# Patient Record
Sex: Female | Born: 1975 | Race: Black or African American | Hispanic: No | Marital: Married | Smoking: Never smoker
Health system: Southern US, Community
[De-identification: ages and names within clinical notes are randomized; demographics above are authoritative.]

## PROBLEM LIST (undated history)

## (undated) DIAGNOSIS — I639 Cerebral infarction, unspecified: Secondary | ICD-10-CM

## (undated) DIAGNOSIS — R601 Generalized edema: Secondary | ICD-10-CM

## (undated) DIAGNOSIS — J45909 Unspecified asthma, uncomplicated: Secondary | ICD-10-CM

## (undated) DIAGNOSIS — I509 Heart failure, unspecified: Secondary | ICD-10-CM

## (undated) DIAGNOSIS — E119 Type 2 diabetes mellitus without complications: Secondary | ICD-10-CM

## (undated) DIAGNOSIS — Z515 Encounter for palliative care: Secondary | ICD-10-CM

## (undated) DIAGNOSIS — K219 Gastro-esophageal reflux disease without esophagitis: Secondary | ICD-10-CM

## (undated) DIAGNOSIS — I10 Essential (primary) hypertension: Secondary | ICD-10-CM

---
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
## 2010-11-13 ENCOUNTER — Inpatient Hospital Stay (INDEPENDENT_AMBULATORY_CARE_PROVIDER_SITE_OTHER)
Admission: RE | Admit: 2010-11-13 | Discharge: 2010-11-13 | Disposition: A | Discharge status: Home / Self Care | Payer: Self-pay | Source: Ambulatory Visit | Attending: Family Medicine | Admitting: Family Medicine

## 2010-11-13 DIAGNOSIS — J069 Acute upper respiratory infection, unspecified: Secondary | ICD-10-CM

## 2010-11-13 DIAGNOSIS — I10 Essential (primary) hypertension: Secondary | ICD-10-CM

## 2011-09-26 ENCOUNTER — Emergency Department (HOSPITAL_COMMUNITY): Payer: Medicaid Other

## 2011-09-26 ENCOUNTER — Emergency Department (HOSPITAL_COMMUNITY)
Admission: EM | Admit: 2011-09-26 | Discharge: 2011-09-26 | Disposition: A | Discharge status: Home / Self Care | Payer: Medicaid Other | Attending: Emergency Medicine | Admitting: Emergency Medicine

## 2011-09-26 DIAGNOSIS — M129 Arthropathy, unspecified: Secondary | ICD-10-CM | POA: Insufficient documentation

## 2011-09-26 DIAGNOSIS — M545 Low back pain, unspecified: Secondary | ICD-10-CM | POA: Insufficient documentation

## 2011-09-26 DIAGNOSIS — I10 Essential (primary) hypertension: Secondary | ICD-10-CM

## 2011-09-26 DIAGNOSIS — M1990 Unspecified osteoarthritis, unspecified site: Secondary | ICD-10-CM

## 2011-09-26 MED ORDER — TRIAMTERENE-HCTZ 75-50 MG PO TABS: 1.0000 | ORAL_TABLET | Freq: Every day | ORAL | Status: DC

## 2011-09-26 MED ORDER — CYCLOBENZAPRINE HCL 10 MG PO TABS: 10.0000 mg | ORAL_TABLET | Freq: Two times a day (BID) | ORAL | Status: AC | PRN

## 2011-09-26 MED ORDER — INSULIN GLARGINE 100 UNIT/ML ~~LOC~~ SOLN: 50.0000 [IU] | Freq: Every day | SUBCUTANEOUS | Status: DC

## 2011-09-26 MED ORDER — OXYCODONE-ACETAMINOPHEN 5-325 MG PO TABS: 1.0000 | ORAL_TABLET | Freq: Four times a day (QID) | ORAL | Status: AC | PRN

## 2011-09-26 MED ORDER — OXYCODONE-ACETAMINOPHEN 5-325 MG PO TABS
1.0000 | ORAL_TABLET | Freq: Once | ORAL | Status: AC
  Administered 2011-09-26: 1 via ORAL
  Filled 2011-09-26: qty 1

## 2011-09-26 NOTE — ED Notes (Addendum)
Pt reports pain with range of motion, going from sitting to standing position and lying flat. Denies urinary incontinence, numbness or tingling with the pain.

## 2011-09-26 NOTE — ED Notes (Signed)
Pt ambulatory in department with steady gait

## 2011-09-26 NOTE — ED Notes (Signed)
Pt c/o midlower back pain, nonradiating, denies numbness/tingling. States tried ibuprofen at home with no relief. Denies recent injury, urinary symptoms.

## 2011-09-26 NOTE — ED Provider Notes (Addendum)
History     CSN: [id number]  Arrival date & time 09/26/11  0808   First MD Initiated Contact with Patient 09/26/11 [phone]      Chief Complaint  Patient presents with  . Back Pain    (Consider location/radiation/quality/duration/timing/severity/associated sxs/prior treatment) Patient is a 36 y.o. female presenting with back pain. The history is provided by the patient.  Back Pain  This is a new problem. Episode onset: 4 days ago. The problem occurs constantly. The problem has been gradually worsening. The pain is associated with no known injury (fall 2 years ago but no ongoing back pain). The pain is present in the lumbar spine. The quality of the pain is described as aching. The pain does not radiate. The pain is at a severity of 8/10. The symptoms are aggravated by bending, twisting and certain positions (worse with sitting). The pain is the same all the time. Pertinent negatives include no fever, no abdominal pain, no bowel incontinence, no bladder incontinence, no dysuria, no pelvic pain, no leg pain, no paresis and no weakness. She has tried NSAIDs for the symptoms. The treatment provided no relief. Risk factors include obesity.    No past medical history on file.  No past surgical history on file.  No family history on file.  History  Substance Use Topics  . Smoking status: Not on file  . Smokeless tobacco: Not on file  . Alcohol Use: Not on file    OB History    No data available      Review of Systems  Constitutional: Negative for fever.  Gastrointestinal: Negative for abdominal pain and bowel incontinence.  Genitourinary: Negative for bladder incontinence, dysuria and pelvic pain.  Musculoskeletal: Positive for back pain.  Neurological: Negative for weakness.  All other systems reviewed and are negative.    Allergies  Review of patient's allergies indicates not on file.  Home Medications  No current outpatient prescriptions on file.  Pulse 99  Temp 98.7 F  (37.1 C) (Oral)  Resp 16  SpO2 99%  Physical Exam  Nursing note and vitals reviewed. Constitutional: She is oriented to person, place, and time. She appears well-developed and well-nourished. No distress.  HENT:  Head: Normocephalic and atraumatic.  Mouth/Throat: Oropharynx is clear and moist.  Eyes: EOM are normal. Pupils are equal, round, and reactive to light.  Neck: Normal range of motion. Neck supple. No spinous process tenderness and no muscular tenderness present. No rigidity. Normal range of motion present.  Cardiovascular: Normal rate, regular rhythm, normal heart sounds and intact distal pulses.   No murmur heard. Pulmonary/Chest: Effort normal and breath sounds normal. She has no wheezes. She has no rales.  Abdominal: Soft. She exhibits no distension. There is no tenderness. There is no CVA tenderness.  Musculoskeletal: She exhibits no tenderness.       Lumbar back: She exhibits tenderness and pain. She exhibits normal range of motion, no swelling, no deformity and normal pulse.  Neurological: She is alert and oriented to person, place, and time. She has normal strength. No sensory deficit. Coordination normal.  Reflex Scores:      Patellar reflexes are 2+ on the right side and 2+ on the left side. Skin: Skin is warm and dry. No rash noted.  Psychiatric: She has a normal mood and affect.    ED Course  Procedures (including critical care time)  Labs Reviewed - No data to display Dg Lumbar Spine Complete  09/26/2011  *RADIOLOGY REPORT*  Clinical Data: Chronic  low back pain  LUMBAR SPINE - COMPLETE 4+ VIEW  Comparison: None.  Findings: The lumbar vertebrae are in normal alignment.  There is mild degenerative disc disease at L5-S1 with slight loss of disc space and spurring.  The remainder of intervertebral disc spaces appear normal.  No compression deformity is seen.  On oblique views the facet joints are unremarkable.  The SI joints are corticated. The bowel gas pattern is  unremarkable.  IMPRESSION:   Normal alignment with mild degenerative disc disease at L5-S1.  Original Report Authenticated By: Juline Patch, M.D.     1. Lumbar back pain   2. Arthritis   3. HTN (hypertension)       MDM   Pt with gradual onset of back pain suggestive of lumbar in origin.  No neurovascular compromise and no incontinence.  Pt has no infectious sx, hx of CA  or other red flags concerning for pathologic back pain.  Pt is able to ambulate and worse with sitting.  Normal strength and reflexes on exam.  Denies trauma.  Pt is morbidly obese which could also be a cause for her pain.   Lumbar films with mild degenerative dx in L5-S1 area which is where pt is having pain.  Pt also HTN today however ran out of maxide 3 days ago.  Denies any sx of htn (CP, HA, visual problems, etc).  Pt's ppx refilled and given resources for PCP. Will give pt pain control and to return for developement of above sx.         Gwyneth Sprout, MD 09/26/11 [id number]  Gwyneth Sprout, MD 09/26/11 [phone]

## 2011-11-22 ENCOUNTER — Encounter (HOSPITAL_COMMUNITY): Payer: Self-pay | Admitting: *Deleted

## 2011-11-22 ENCOUNTER — Emergency Department (HOSPITAL_COMMUNITY)
Admission: EM | Admit: 2011-11-22 | Discharge: 2011-11-22 | Disposition: A | Discharge status: Home / Self Care | Payer: Medicaid Other | Attending: Emergency Medicine | Admitting: Emergency Medicine

## 2011-11-22 DIAGNOSIS — M79604 Pain in right leg: Secondary | ICD-10-CM

## 2011-11-22 DIAGNOSIS — Z794 Long term (current) use of insulin: Secondary | ICD-10-CM | POA: Insufficient documentation

## 2011-11-22 DIAGNOSIS — I10 Essential (primary) hypertension: Secondary | ICD-10-CM | POA: Insufficient documentation

## 2011-11-22 DIAGNOSIS — E119 Type 2 diabetes mellitus without complications: Secondary | ICD-10-CM | POA: Insufficient documentation

## 2011-11-22 DIAGNOSIS — M79609 Pain in unspecified limb: Secondary | ICD-10-CM | POA: Insufficient documentation

## 2011-11-22 DIAGNOSIS — R739 Hyperglycemia, unspecified: Secondary | ICD-10-CM

## 2011-11-22 HISTORY — DX: Essential (primary) hypertension: I10

## 2011-11-22 HISTORY — DX: Morbid (severe) obesity due to excess calories: E66.01

## 2011-11-22 LAB — CBC WITH DIFFERENTIAL/PLATELET
Basophils Absolute: 0 10*3/uL (ref 0.0–0.1)
Basophils Relative: 0 % (ref 0–1)
Eosinophils Absolute: 0.2 10*3/uL (ref 0.0–0.7)
HCT: 42.2 % (ref 36.0–46.0)
Hemoglobin: 14 g/dL (ref 12.0–15.0)
MCH: 27.1 pg (ref 26.0–34.0)
MCHC: 33.2 g/dL (ref 30.0–36.0)
Monocytes Absolute: 0.5 10*3/uL (ref 0.1–1.0)
Monocytes Relative: 8 % (ref 3–12)
Neutro Abs: 2.6 10*3/uL (ref 1.7–7.7)
Neutrophils Relative %: 38 % — ABNORMAL LOW (ref 43–77)
RDW: 13.5 % (ref 11.5–15.5)

## 2011-11-22 LAB — COMPREHENSIVE METABOLIC PANEL
AST: 10 U/L (ref 0–37)
Albumin: 3.5 g/dL (ref 3.5–5.2)
BUN: 7 mg/dL (ref 6–23)
Creatinine, Ser: 0.65 mg/dL (ref 0.50–1.10)
Total Protein: 7.6 g/dL (ref 6.0–8.3)

## 2011-11-22 MED ORDER — IBUPROFEN 800 MG PO TABS
800.0000 mg | ORAL_TABLET | Freq: Once | ORAL | Status: AC
  Administered 2011-11-22: 800 mg via ORAL
  Filled 2011-11-22: qty 1

## 2011-11-22 MED ORDER — IBUPROFEN 800 MG PO TABS: 800.0000 mg | ORAL_TABLET | Freq: Three times a day (TID) | ORAL | Status: DC

## 2011-11-22 MED ORDER — TRIAMTERENE-HCTZ 75-50 MG PO TABS: 1.0000 | ORAL_TABLET | Freq: Every day | ORAL | Status: DC

## 2011-11-22 NOTE — ED Provider Notes (Signed)
History     CSN: [id number]  Arrival date & time 11/22/11  1017   First MD Initiated Contact with Patient 11/22/11 1038      Chief Complaint  Patient presents with  . Leg Pain    (Consider location/radiation/quality/duration/timing/severity/associated sxs/prior treatment) HPI  A morbidly obese 36 year old female with history of diabetes and hypertension presents complaining of right leg pain. For the past 2 weeks patient has had pain to her right calf.  She described her pain as a throbbing and cramping sensation, radiating up to her R thigh.  Pain worsen when she sits or walk, and improves with rest.  She occasional noticed tingling sensation to R foot, that is intermittent.  She denies fever, chills, cp, sob, hemoptysis, or rash.  Denies leg swelling.  Did recalled accidentally slipped and fell 4 weeks ago but did not have any significant pain at that time.  Pt has tried OTC NSAIDs without relief.    Pt acknowledge her BP has always remains high, above 200 systolic.  She takes Maxzide for it but has ran out for the past 4 days.  Denies headache, vision changes, weakness or numbness.  Does not have a PCP.  Past Medical History  Diagnosis Date  . Morbid obesity   . Hypertension   . Diabetes mellitus without complication     History reviewed. No pertinent past surgical history.  No family history on file.  History  Substance Use Topics  . Smoking status: Not on file  . Smokeless tobacco: Not on file  . Alcohol Use: No    OB History    Grav Para Term Preterm Abortions TAB SAB Ect Mult Living                  Review of Systems  All other systems reviewed and are negative.    Allergies  Review of patient's allergies indicates no known allergies.  Home Medications   Current Outpatient Rx  Name Route Sig Dispense Refill  . IBUPROFEN 200 MG PO TABS Oral Take 400 mg by mouth every 6 (six) hours as needed. As needed for pain.    . INSULIN GLARGINE 100 UNIT/ML [place] SOLN  Subcutaneous Inject 50 Units into the skin at bedtime.    . INSULIN GLARGINE 100 UNIT/ML [place] SOLN Subcutaneous Inject 50 Units into the skin at bedtime. 10 mL 12  . TRIAMTERENE-HCTZ 75-50 MG PO TABS Oral Take 1 tablet by mouth daily. 30 tablet 6    BP 252/153  Pulse 110  Temp 98.3 F (36.8 C) (Oral)  Resp 22  SpO2 95%  LMP 10/20/2011  Physical Exam  Nursing note and vitals reviewed. Constitutional: She is oriented to person, place, and time. She appears well-developed and well-nourished. No distress.       Awake, alert, nontoxic appearance. Morbidly obese  HENT:  Head: Atraumatic.  Mouth/Throat: Oropharynx is clear and moist.  Eyes: Conjunctivae normal are normal. Right eye exhibits no discharge. Left eye exhibits no discharge.  Neck: Neck supple.  Cardiovascular: Normal rate and regular rhythm.  Exam reveals no gallop and no friction rub.   No murmur heard. Pulmonary/Chest: Effort normal. No respiratory distress. She has no wheezes. She exhibits no tenderness.  Abdominal: Soft. There is no tenderness. There is no rebound.  Musculoskeletal: She exhibits no edema and no tenderness.       ROM appears intact, no obvious focal weakness  R leg: tenderness to R calf without swelling or overlying skin changes.  No  baker's cyst noted.  R knee with FROM, nontender  BLE: no palpable cords, erythema, pedal edema.  Pedal pulses 2+.  Neurological: She is alert and oriented to person, place, and time.       Mental status and motor strength appears intact  Normal gait  Skin: Skin is warm. No rash noted.  Psychiatric: She has a normal mood and affect.    ED Course  Procedures (including critical care time)  Results for orders placed during the hospital encounter of 11/22/11  CBC WITH DIFFERENTIAL      Component Value Range   WBC 6.7  4.0 - 10.5 K/uL   RBC 5.16 (*) 3.87 - 5.11 MIL/uL   Hemoglobin 14.0  12.0 - 15.0 g/dL   HCT 64.4  03.4 - 74.2 %   MCV 81.8  78.0 - 100.0 fL   MCH 27.1   26.0 - 34.0 pg   MCHC 33.2  30.0 - 36.0 g/dL   RDW 59.5  63.8 - 75.6 %   Platelets PLATELETS APPEAR ADEQUATE  150 - 400 K/uL   Neutrophils Relative 38 (*) 43 - 77 %   Neutro Abs 2.6  1.7 - 7.7 K/uL   Lymphocytes Relative 51 (*) 12 - 46 %   Lymphs Abs 3.4  0.7 - 4.0 K/uL   Monocytes Relative 8  3 - 12 %   Monocytes Absolute 0.5  0.1 - 1.0 K/uL   Eosinophils Relative 3  0 - 5 %   Eosinophils Absolute 0.2  0.0 - 0.7 K/uL   Basophils Relative 0  0 - 1 %   Basophils Absolute 0.0  0.0 - 0.1 K/uL  COMPREHENSIVE METABOLIC PANEL      Component Value Range   Sodium 135  135 - 145 mEq/L   Potassium 3.7  3.5 - 5.1 mEq/L   Chloride 99  96 - 112 mEq/L   CO2 28  19 - 32 mEq/L   Glucose, Bld 300 (*) 70 - 99 mg/dL   BUN 7  6 - 23 mg/dL   Creatinine, Ser 4.33  0.50 - 1.10 mg/dL   Calcium 9.5  8.4 - 29.5 mg/dL   Total Protein 7.6  6.0 - 8.3 g/dL   Albumin 3.5  3.5 - 5.2 g/dL   AST 10  0 - 37 U/L   ALT 11  0 - 35 U/L   Alkaline Phosphatase 50  39 - 117 U/L   Total Bilirubin 0.2 (*) 0.3 - 1.2 mg/dL   GFR calc non Af Amer >90  >90 mL/min   GFR calc Af Amer >90  >90 mL/min   No results found.  1. R calf pain 2. hypertension   MDM  Pt with R calf pain, reproducible on palpation.  No risk factors for PE or DVT. Pain likely musculoskeletal.  Does not meet Well's criteria for PE.  Pt does have elevated BP with 250s systolic.   Will check labs to r/o endorgan damage.  Ibuprofen for pain.  Will recheck BP.    Will prescribe Maxzide at discharge, give referral resources.  Discussed care with my attending who agrees with plan.     1:03 PM No evidence of end organ damage based on labs.  Pt has CBG 300, however did not take her morning meds.  Repeat BP is 170s systolic.  Therefore, will refill her Maxzide, will provide pain meds, and will have pt f/u with her PCP for further care.  Pt voice understanding and agrees with plan.  BP 184/118  Pulse 89  Temp 98 F (36.7 C) (Oral)  Resp 18  SpO2  96%  LMP 10/20/2011  Nursing notes reviewed and considered in documentation  Previous records reviewed and considered  All labs/vitals reviewed and considered     Fayrene Helper, PA-C 11/22/11 1307

## 2011-11-22 NOTE — ED Notes (Signed)
Pt slipped and fell one month ago and has had pain in her right leg since.  Pain is most severe in her right calf.  No swelling per pt, no increasing pain when lifting foot.  Pt is ambulatory, pain increased today

## 2011-11-23 NOTE — ED Provider Notes (Signed)
Medical screening examination/treatment/procedure(s) were performed by non-physician practitioner and as supervising physician I was immediately available for consultation/collaboration.  Toy Baker, MD 11/23/11 [phone]

## 2011-11-26 ENCOUNTER — Emergency Department (HOSPITAL_COMMUNITY)
Admission: EM | Admit: 2011-11-26 | Discharge: 2011-11-26 | Disposition: A | Discharge status: Home / Self Care | Payer: Medicaid Other | Source: Home / Self Care | Attending: Family Medicine | Admitting: Family Medicine

## 2011-11-26 ENCOUNTER — Encounter (HOSPITAL_COMMUNITY): Payer: Self-pay | Admitting: Emergency Medicine

## 2011-11-26 DIAGNOSIS — IMO0002 Reserved for concepts with insufficient information to code with codable children: Secondary | ICD-10-CM

## 2011-11-26 DIAGNOSIS — S86919A Strain of unspecified muscle(s) and tendon(s) at lower leg level, unspecified leg, initial encounter: Secondary | ICD-10-CM

## 2011-11-26 DIAGNOSIS — I10 Essential (primary) hypertension: Secondary | ICD-10-CM

## 2011-11-26 MED ORDER — TRAMADOL HCL 50 MG PO TABS: 50.0000 mg | ORAL_TABLET | Freq: Four times a day (QID) | ORAL | Status: DC | PRN

## 2011-11-26 MED ORDER — TRIAMTERENE-HCTZ 75-50 MG PO TABS: 1.0000 | ORAL_TABLET | Freq: Every day | ORAL | Status: DC

## 2011-11-26 NOTE — ED Notes (Signed)
Pt c/o right leg pain at calf x1 week... Says she fell and slipped on water around 10/20/11... Did not feel the pain at first but now is experiencing pain at right calf and radiating towards right thigh... Unable to apply pressure and unable to to daily activities... Denies: fevers, vomiting, nausea, diarrhea... Pt is alert w/no signs of distress.

## 2011-11-26 NOTE — ED Provider Notes (Signed)
History     CSN: [id number]  Arrival date & time 11/26/11  4098   First MD Initiated Contact with Patient 11/26/11 [phone]      Chief Complaint  Patient presents with  . Leg Pain    (Consider location/radiation/quality/duration/timing/severity/associated sxs/prior treatment) Patient is a 36 y.o. female presenting with leg pain. The history is provided by the patient.  Leg Pain  The incident occurred more than 1 week ago (3 weeks ago  slipped at home on water, pain since, seen in ER 10/11, eval, , pt concerned that x-ray not done, still hurts to walk.). The incident occurred at home. The injury mechanism was a fall. The pain is present in the right leg. The pain is moderate. Pertinent negatives include no numbness, no loss of motion, no muscle weakness, no loss of sensation and no tingling. The symptoms are aggravated by bearing weight. She has tried NSAIDs for the symptoms.    Past Medical History  Diagnosis Date  . Morbid obesity   . Hypertension   . Diabetes mellitus without complication     History reviewed. No pertinent past surgical history.  No family history on file.  History  Substance Use Topics  . Smoking status: Not on file  . Smokeless tobacco: Not on file  . Alcohol Use: No    OB History    Grav Para Term Preterm Abortions TAB SAB Ect Mult Living                  Review of Systems  Constitutional: Negative.   Musculoskeletal: Positive for gait problem. Negative for back pain and joint swelling.  Neurological: Negative for tingling and numbness.    Allergies  Review of patient's allergies indicates no known allergies.  Home Medications   Current Outpatient Rx  Name Route Sig Dispense Refill  . IBUPROFEN 800 MG PO TABS Oral Take 1 tablet (800 mg total) by mouth 3 (three) times daily. 21 tablet 0  . INSULIN GLARGINE 100 UNIT/ML [place] SOLN Subcutaneous Inject 50 Units into the skin at bedtime. 10 mL 12  . TRIAMTERENE-HCTZ 75-50 MG PO TABS Oral Take 1  tablet by mouth daily. 30 tablet 3  . IBUPROFEN 200 MG PO TABS Oral Take 400 mg by mouth every 6 (six) hours as needed. As needed for pain.    Marland Kitchen TRAMADOL HCL 50 MG PO TABS Oral Take 1 tablet (50 mg total) by mouth every 6 (six) hours as needed for pain. 15 tablet 0  . TRIAMTERENE-HCTZ 75-50 MG PO TABS Oral Take 1 tablet by mouth daily. 30 tablet 1    BP 248/147  Pulse 88  Temp 98.4 F (36.9 C) (Oral)  Resp 16  SpO2 100%  LMP 10/20/2011  Physical Exam  Nursing note and vitals reviewed. Constitutional: She is oriented to person, place, and time. She appears well-developed and well-nourished.  Musculoskeletal: She exhibits tenderness. She exhibits no edema.       Right lower leg: She exhibits no bony tenderness, no swelling and no edema.       Legs: Neurological: She is alert and oriented to person, place, and time.  Skin: Skin is warm and dry.    ED Course  Procedures (including critical care time)  Labs Reviewed - No data to display No results found.   1. Muscle strain, lower leg   2. Hypertension, benign       MDM  I agree completely with eval and assessment from ER visit 10/11.  Linna Hoff, MD 11/26/11 [phone]

## 2012-02-12 NOTE — L&D Delivery Note (Signed)
Delivery Note At 6:42 PM a viable female was delivered via Vaginal, Spontaneous Delivery (Presentation: Left Occiput Anterior).  APGAR: 9, 9; weight .   Placenta status: Intact, Spontaneous.  Cord: 3 vessels with the following complications: None. There was a tight nuchal cord that I reduced after delivery. She wishes to have a PPS.   Anesthesia: Epidural  Episiotomy: None Lacerations: None Suture Repair: n/a Est. Blood Loss (mL): 300  Mom to postpartum.  Baby to nursery-stable.  Kendricks Reap C. 10/11/2012, 7:08 PM

## 2012-08-11 ENCOUNTER — Encounter: Payer: Self-pay | Admitting: *Deleted

## 2012-08-26 ENCOUNTER — Other Ambulatory Visit (HOSPITAL_COMMUNITY)
Admission: RE | Admit: 2012-08-26 | Discharge: 2012-08-26 | Disposition: A | Discharge status: Home / Self Care | Payer: Medicaid Other | Source: Ambulatory Visit | Attending: Obstetrics and Gynecology | Admitting: Obstetrics and Gynecology

## 2012-08-26 ENCOUNTER — Ambulatory Visit (INDEPENDENT_AMBULATORY_CARE_PROVIDER_SITE_OTHER): Payer: Medicaid Other | Admitting: Obstetrics and Gynecology

## 2012-08-26 ENCOUNTER — Encounter: Payer: Self-pay | Admitting: Obstetrics and Gynecology

## 2012-08-26 VITALS — BP 114/62

## 2012-08-26 DIAGNOSIS — O24919 Unspecified diabetes mellitus in pregnancy, unspecified trimester: Secondary | ICD-10-CM

## 2012-08-26 DIAGNOSIS — O09293 Supervision of pregnancy with other poor reproductive or obstetric history, third trimester: Secondary | ICD-10-CM | POA: Insufficient documentation

## 2012-08-26 DIAGNOSIS — O0930 Supervision of pregnancy with insufficient antenatal care, unspecified trimester: Secondary | ICD-10-CM

## 2012-08-26 DIAGNOSIS — Z01419 Encounter for gynecological examination (general) (routine) without abnormal findings: Secondary | ICD-10-CM | POA: Insufficient documentation

## 2012-08-26 DIAGNOSIS — E119 Type 2 diabetes mellitus without complications: Secondary | ICD-10-CM

## 2012-08-26 DIAGNOSIS — O163 Unspecified maternal hypertension, third trimester: Secondary | ICD-10-CM

## 2012-08-26 DIAGNOSIS — Z1151 Encounter for screening for human papillomavirus (HPV): Secondary | ICD-10-CM | POA: Insufficient documentation

## 2012-08-26 DIAGNOSIS — O0933 Supervision of pregnancy with insufficient antenatal care, third trimester: Secondary | ICD-10-CM

## 2012-08-26 DIAGNOSIS — O09529 Supervision of elderly multigravida, unspecified trimester: Secondary | ICD-10-CM

## 2012-08-26 DIAGNOSIS — O169 Unspecified maternal hypertension, unspecified trimester: Secondary | ICD-10-CM | POA: Insufficient documentation

## 2012-08-26 DIAGNOSIS — O09522 Supervision of elderly multigravida, second trimester: Secondary | ICD-10-CM

## 2012-08-26 LAB — POCT URINALYSIS DIP (DEVICE)
Bilirubin Urine: NEGATIVE
Glucose, UA: NEGATIVE mg/dL
Hgb urine dipstick: NEGATIVE
Ketones, ur: NEGATIVE mg/dL
Specific Gravity, Urine: 1.015 (ref 1.005–1.030)
pH: 7 (ref 5.0–8.0)

## 2012-08-26 LAB — HEMOGLOBIN A1C: Mean Plasma Glucose: 91 mg/dL (ref <117)

## 2012-08-26 LAB — GLUCOSE, CAPILLARY: Glucose-Capillary: 66 mg/dL — ABNORMAL LOW (ref 70–99)

## 2012-08-26 MED ORDER — BLOOD GLUCOSE METER KIT: PACK | Status: DC

## 2012-08-26 MED ORDER — GLUCOSE BLOOD VI STRP: ORAL_STRIP | Status: DC

## 2012-08-26 MED ORDER — CONCEPT OB 130-92.4-1 MG PO CAPS: 1.0000 | ORAL_CAPSULE | ORAL | Status: DC

## 2012-08-26 MED ORDER — ACCU-CHEK FASTCLIX LANCETS MISC: 1.0000 | Freq: Four times a day (QID) | Status: DC

## 2012-08-26 NOTE — Addendum Note (Signed)
Addended by: Jill Side on: 08/26/2012 05:06 PM   Modules accepted: Orders

## 2012-08-26 NOTE — Progress Notes (Signed)
   Subjective:    [name] is a [id number], with unknown LMP estimated to be around 01/12/12, being seen today for her first obstetrical visit.  No prior prenatal care, no [place] for dating. On PNV from [facility] department. Her obstetrical history is significant for obesity. Patient does not intend to breast feed. Pregnancy history fully reviewed.  Patient reports bilateral round ligament pain/pressure.  no bleeding, no contractions and no leaking.   There were no vitals filed for this visit.  HISTORY: OB History   Grav Para Term Preterm Abortions TAB SAB Ect Mult Living   3 2 2  0 0 0 0 0 0 2     # Outc Date GA Lbr Len/2nd Wgt Sex Del Anes PTL Lv   1 TRM  105w0d  6lb9oz(2.977kg) F SVD EPI     2 TRM  [redacted]w[redacted]d  6lb7oz(2.92kg) M SVD EPI  Yes   3 CUR              Past Medical History  Diagnosis Date  . Morbid obesity   . Hypertension   . Diabetes mellitus without complication    History reviewed. No pertinent past surgical history. Family History  Problem Relation Age of Onset  . Diabetes Mother      Exam    Uterus:     Pelvic Exam:    Perineum: Hemorrhoids   Vulva: normal   Vagina:  normal mucosa, normal discharge   pH:    Cervix: no bleeding following Pap, no cervical motion tenderness and no lesions   Adnexa: not evaluated      System: Breast:  normal appearance, no masses or tenderness   Skin: normal coloration and turgor, no rashes    Neurologic: oriented, no focal deficits   Extremities: normal strength, tone, and muscle mass   HEENT PERRLA   Mouth/Teeth mucous membranes moist, pharynx normal without lesions   Neck Supple, no masses, non-tender, no thyromegaly   Cardiovascular: regular rate and rhythm, no murmurs or gallops   Respiratory:  chest clear, no wheezing, crepitations, rhonchi, normal symmetric air entry   Abdomen: soft, non-tender; bowel sounds normal; no masses,  no organomegaly   Urinary: urethral meatus normal      Assessment:    Pregnancy:  [id number] Patient Active Problem List   Diagnosis Date Noted  . AMA (advanced maternal age) multigravida 35+ 08/26/2012  . Diabetes in pregnancy 08/26/2012  . Insufficient prenatal care 08/26/2012        Plan:     Initial labs drawn. Prenatal vitamins. Problem list reviewed and updated. Too late for Genetic Screening discussed  No prior Ultrasound. Scheduled for [place] and NST. Stop Maxide for HTN (BP today 114/62), take regular dose of Lantus, may need to titrate at next visit. Stop all Ibuprofen Continue PNV   Follow up in 1 weeks - apt on Monday 08/31/12 50% of 30 min visit spent on counseling and coordination of care.     Saralyn Pilar 08/26/2012

## 2012-08-26 NOTE — Progress Notes (Signed)
Evaluation and management procedures were performed by Resident physician under my supervision/collaboration. Chart reviewed, patient examined by me and I agree with management and plan. [place]oved here from [place], no PMD. No menses since SVD 10 mo ago. Pt refused to collect 24 hr urine. D/W Dr Debroah Loop re plan to d/c Maxzide and insulin (pt not taking insulin as advised), keep log book, follow CHO restriction diet, return Monday.

## 2012-08-26 NOTE — Progress Notes (Signed)
P = 66  Fasting CBG = 66.    OB US scheduled 7/17 @ 0730

## 2012-08-26 NOTE — Patient Instructions (Signed)
Type 1 or Type 2 Diabetes Mellitus During Pregnancy   Diabetes mellitus, often simply referred to as diabetes, is a long-term (chronic) disease. Type 1 diabetes occurs when the islet cells in the pancreas that make insulin (a hormone) are destroyed and can no longer make insulin. Type 2 diabetes occurs when the pancreas does not make enough insulin, the cells are less responsive to the insulin that is made (insulin resistance), or both. Insulin is needed to move sugars from food into the tissue cells. The tissue cells use the sugars for energy. The lack of insulin or the lack of normal response to insulin causes excess sugars to build up in the blood instead of going into the tissue cells. As a result, high blood sugar (hyperglycemia) develops.   If blood glucose levels are kept in the normal range both before and during pregnancy, women can have a healthy pregnancy. If your blood glucose levels are not well controlled, there may be risks to you, your unborn baby (fetus), your labor and delivery, or your newborn baby.   RISK FACTORS   · You are predisposed to developing type 1 diabetes if someone in your family has diabetes and you are exposed to certain environmental triggers.  · You have an increased chance of developing type 2 diabetes if you have a family history of diabetes and also have one or more of the following risk factors:  · Being overweight.  · Having an inactive lifestyle.  · Having a history of consistently eating high-calorie foods.   SYMPTOMS  The symptoms of diabetes include:  · Increased thirst (polydipsia).  · Increased urination (polyuria).  · Increased urination during the night (nocturia).  · Weight loss. This weight loss may be rapid.  · Frequent, recurring infections.  · Tiredness (fatigue).  · Weakness.  · Vision changes, such as blurred vision.  · Fruity smell to your breath.  · Abdominal pain.  · Nausea or vomiting.  DIAGNOSIS   Diabetes is diagnosed when blood glucose levels are  increased. Your blood glucose level may be checked by one or more of the following blood tests:  · A fasting blood glucose test.  You will not be allowed to eat for at least 8 hours before a blood sample is taken.  · A random blood glucose test. Your blood glucose is checked at any time of the day regardless of when you ate.  · A hemoglobin A1c blood glucose test. A hemoglobin A1c test provides information about blood glucose control over the previous 3 months.  · An oral glucose tolerance test (OGTT). Your blood glucose is measured after you have not eaten (fasted) for 1 3 hours and then after you drink a glucose-containing beverage. An OGTT is usually performed during weeks 24 28 of your pregnancy.  TREATMENT   · You will need to take diabetes medicine or insulin daily to keep blood glucose levels in the desired range.  · You will need to match insulin dosing with exercise and healthy food choices.   The treatment goal is to maintain the before-meal (preprandial), bedtime, and overnight blood glucose level at 60 99 mg/dL during pregnancy. The treatment goal is to further maintain the peak after-meal blood sugar (postprandial glucose) level at 100 140 mg/dL.    HOME CARE INSTRUCTIONS   · Have your hemoglobin A1c level checked twice a year.  · Perform daily blood glucose monitoring as directed by your caregiver. It is common to perform frequent blood glucose monitoring.    · Monitor urine ketones when   you are ill and as directed by your caregiver.  · Take your diabetes medicine and insulin as directed by your caregiver to maintain your blood glucose level in the desired range.  · Never run out of diabetes medicine or insulin. It is needed every day.  · Adjust insulin based on your intake of carbohydrates. Carbohydrates can raise blood glucose levels but need to be included in your diet. Carbohydrates provide vitamins, minerals, and fiber, which are an essential part of a healthy diet. Carbohydrates are found in  fruits, vegetables, whole grains, dairy products, legumes, and foods containing added sugars.  · Eat healthy foods. Alternate 3 meals with 3 snacks.  · Maintain a healthy weight gain. The usual total expected weight gain varies according to your prepregnancy body mass index (BMI).   · Carry a medical alert card or wear medical alert jewelry.  · Carry a 15 gram carbohydrate snack with you at all times to treat low blood sugar (hypoglycemia). Some examples of 15 gram carbohydrate snacks include:  · Glucose tablets, 3 or 4.  · Glucose gel, 15 gram tube.  · Raisins, 2 tablespoons (24 grams).  · Jelly beans, 6.  · Animal crackers, 8.  · Fruit juice, regular soda, or low fat milk, 4 ounces (120 mL).  · Gummy treats, 9.  · Recognize hypoglycemia. Hypoglycemia during pregnancy occurs with blood glucose levels of 60 mg/dL and below. The risk for hypoglycemia increases when fasting or skipping meals, during or after intense exercise, and during sleep. Hypoglycemia symptoms can include:  · Tremors or shakes.  · Decreased ability to concentrate.  · Sweating.  · Increased heart rate.  · Headache.  · Dry mouth.  · Hunger.  · Irritability.  · Anxiety.  · Restless sleep.  · Altered speech or coordination.  · Confusion.  · Treat hypoglycemia promptly. If you are alert and able to safely swallow, follow the 15:15 rule:  · Take 15 20 grams of rapid-acting glucose or carbohydrate. Rapid-acting options include glucose gel, glucose tablets, or 4 ounces (120 mL) of fruit juice, regular soda, or low-fat milk.  · Check your blood glucose level 15 minutes after taking the glucose.  · Take 15 20 grams more of glucose if the repeat blood glucose level is still 70 mg/dL or below.  · Eat a meal or snack within 1 hour once blood glucose levels return to normal.  · Engage in at least 30 minutes of physical activity a day or as directed by your caregiver. Ten minutes of physical activity timed 30 minutes after each meal is encouraged to control  postprandial blood glucose levels.    · Be alert to polyuria and polydipsia, which are early signs of hyperglycemia. An early awareness of hyperglycemia allows for prompt treatment. Treat hyperglycemia as directed by your caregiver.  · Adjust your insulin dosing and food intake as needed if you start a new exercise or sport.  · Follow your sick day plan at any time you are unable to eat or drink as usual.  · Avoid tobacco and alcohol use.  · Follow up with your caregiver regularly.  · Follow the advice of your caregiver regarding your prenatal and post-delivery (postpartum) appointments, meal planning, exercise, medicines, vitamins, blood tests, other medical tests, and physical activities.  · Continue daily skin and foot care. Examine your skin and feet daily for cuts, bruises, redness, nail problems, bleeding, blisters, or sores. A foot exam by a caregiver should be done annually.  · Brush   your teeth and gums at least twice a day and floss at least once a day. Follow up with your dentist regularly.  · Schedule an eye exam during the first trimester of your pregnancy or as directed by your caregiver.  · Share your diabetes management plan with your workplace or school.  · Stay up-to-date with immunizations.  · Learn to manage stress.  · Obtain ongoing diabetes education and support as needed.  SEEK MEDICAL CARE IF:   · You are unable to eat food or drink fluids for more than 6 hours.  · You have nausea and vomiting for more than 6 hours.  · You have a blood glucose level of 200 mg/dL and you have ketones in your urine.  · There is a change in mental status.  · You develop vision problems.  · You have a persistent headache.  · You have upper abdominal pain or discomfort.  · You develop an additional serious illness.  · You have diarrhea for more than 6 hours.  · You have been sick or have had a fever for a couple of days and are not getting better.  SEEK IMMEDIATE MEDICAL CARE IF:  · You have difficulty  breathing.  · You no longer feel the baby moving.  · You are bleeding or have discharge from your vagina.  · You start having premature contractions or labor.  MAKE SURE YOU:  · Understand these instructions.  · Will watch your condition.  · Will get help right away if you are not doing well or get worse.  Document Released: 10/23/2011 Document Revised: 01/15/2012 Document Reviewed: 10/23/2011  [employer]® Patient Information ©2014 [employer].

## 2012-08-27 ENCOUNTER — Ambulatory Visit (HOSPITAL_COMMUNITY)
Admission: RE | Admit: 2012-08-27 | Discharge: 2012-08-27 | Disposition: A | Discharge status: Home / Self Care | Payer: Medicaid Other | Source: Ambulatory Visit | Attending: Obstetrics and Gynecology | Admitting: Obstetrics and Gynecology

## 2012-08-27 DIAGNOSIS — O0933 Supervision of pregnancy with insufficient antenatal care, third trimester: Secondary | ICD-10-CM

## 2012-08-27 DIAGNOSIS — E119 Type 2 diabetes mellitus without complications: Secondary | ICD-10-CM

## 2012-08-27 DIAGNOSIS — O24913 Unspecified diabetes mellitus in pregnancy, third trimester: Secondary | ICD-10-CM

## 2012-08-27 DIAGNOSIS — O09529 Supervision of elderly multigravida, unspecified trimester: Secondary | ICD-10-CM | POA: Insufficient documentation

## 2012-08-27 DIAGNOSIS — O09522 Supervision of elderly multigravida, second trimester: Secondary | ICD-10-CM

## 2012-08-27 DIAGNOSIS — O24919 Unspecified diabetes mellitus in pregnancy, unspecified trimester: Secondary | ICD-10-CM | POA: Insufficient documentation

## 2012-08-27 DIAGNOSIS — Z3689 Encounter for other specified antenatal screening: Secondary | ICD-10-CM | POA: Insufficient documentation

## 2012-08-27 DIAGNOSIS — O163 Unspecified maternal hypertension, third trimester: Secondary | ICD-10-CM

## 2012-08-27 DIAGNOSIS — O10019 Pre-existing essential hypertension complicating pregnancy, unspecified trimester: Secondary | ICD-10-CM | POA: Insufficient documentation

## 2012-08-27 DIAGNOSIS — O0930 Supervision of pregnancy with insufficient antenatal care, unspecified trimester: Secondary | ICD-10-CM | POA: Insufficient documentation

## 2012-08-27 LAB — SICKLE CELL SCREEN: Sickle Cell Screen: NEGATIVE

## 2012-08-27 LAB — OBSTETRIC PANEL
Eosinophils Absolute: 0.1 10*3/uL (ref 0.0–0.7)
Hepatitis B Surface Ag: NEGATIVE
Lymphocytes Relative: 18 % (ref 12–46)
Lymphs Abs: 1.4 10*3/uL (ref 0.7–4.0)
MCH: 30.4 pg (ref 26.0–34.0)
Neutro Abs: 5.8 10*3/uL (ref 1.7–7.7)
Neutrophils Relative %: 74 % (ref 43–77)
Platelets: 168 10*3/uL (ref 150–400)
RBC: 4.21 MIL/uL (ref 3.87–5.11)
WBC: 7.8 10*3/uL (ref 4.0–10.5)

## 2012-08-27 LAB — TSH: TSH: 2.041 u[IU]/mL (ref 0.350–4.500)

## 2012-08-28 LAB — CULTURE, OB URINE: Organism ID, Bacteria: NO GROWTH

## 2012-08-28 LAB — DRUG SCREEN, URINE
Amphetamine Screen, Ur: NEGATIVE
Barbiturate Quant, Ur: NEGATIVE
Cocaine Metabolites: NEGATIVE

## 2012-08-31 ENCOUNTER — Other Ambulatory Visit: Payer: Medicaid Other

## 2012-09-01 ENCOUNTER — Ambulatory Visit (INDEPENDENT_AMBULATORY_CARE_PROVIDER_SITE_OTHER): Payer: Medicaid Other | Admitting: *Deleted

## 2012-09-01 VITALS — BP 107/59 | Wt 169.9 lb

## 2012-09-01 DIAGNOSIS — O169 Unspecified maternal hypertension, unspecified trimester: Secondary | ICD-10-CM

## 2012-09-01 DIAGNOSIS — O163 Unspecified maternal hypertension, third trimester: Secondary | ICD-10-CM

## 2012-09-01 NOTE — Progress Notes (Signed)
Reviewed NST and growth ultrasound from yesterday. AGA baby. Feeling well no complaints. Reviewed PTL danger signs. Did not bring log book of CBG today. Will bring on Monday.

## 2012-09-01 NOTE — Progress Notes (Signed)
P-[id number] 

## 2012-09-02 NOTE — Progress Notes (Signed)
Fungal organisms on Pap. Consider treatment at next visit.

## 2012-09-04 ENCOUNTER — Encounter: Payer: Self-pay | Admitting: *Deleted

## 2012-09-07 ENCOUNTER — Encounter: Payer: Self-pay | Admitting: Obstetrics & Gynecology

## 2012-09-07 ENCOUNTER — Ambulatory Visit (INDEPENDENT_AMBULATORY_CARE_PROVIDER_SITE_OTHER): Payer: Medicaid Other | Admitting: Obstetrics & Gynecology

## 2012-09-07 VITALS — BP 123/65 | Wt 172.0 lb

## 2012-09-07 DIAGNOSIS — O10013 Pre-existing essential hypertension complicating pregnancy, third trimester: Secondary | ICD-10-CM

## 2012-09-07 DIAGNOSIS — O24913 Unspecified diabetes mellitus in pregnancy, third trimester: Secondary | ICD-10-CM

## 2012-09-07 DIAGNOSIS — O24919 Unspecified diabetes mellitus in pregnancy, unspecified trimester: Secondary | ICD-10-CM

## 2012-09-07 DIAGNOSIS — O10019 Pre-existing essential hypertension complicating pregnancy, unspecified trimester: Secondary | ICD-10-CM

## 2012-09-07 LAB — POCT URINALYSIS DIP (DEVICE)
Ketones, ur: NEGATIVE mg/dL
Protein, ur: NEGATIVE mg/dL
Specific Gravity, Urine: 1.025 (ref 1.005–1.030)
Urobilinogen, UA: 0.2 mg/dL (ref 0.0–1.0)
pH: 7 (ref 5.0–8.0)

## 2012-09-07 NOTE — Progress Notes (Signed)
P = 76    Pt c/o having heartburn at night- advised she can take Zantac.

## 2012-09-07 NOTE — Patient Instructions (Signed)

## 2012-09-07 NOTE — Progress Notes (Signed)
NST today is reactive. States not doing CBG very often, 65-67 yesterday fasting and postprandial. Hb A1c is 4.8, has been off meds 2 years. Not diabetic, will follow routine

## 2012-09-15 ENCOUNTER — Ambulatory Visit (INDEPENDENT_AMBULATORY_CARE_PROVIDER_SITE_OTHER): Payer: Medicaid Other | Admitting: Obstetrics & Gynecology

## 2012-09-15 VITALS — BP 106/70 | Temp 97.1°F | Wt 174.3 lb

## 2012-09-15 DIAGNOSIS — O169 Unspecified maternal hypertension, unspecified trimester: Secondary | ICD-10-CM

## 2012-09-15 LAB — POCT URINALYSIS DIP (DEVICE)
Hgb urine dipstick: NEGATIVE
Nitrite: NEGATIVE
Protein, ur: NEGATIVE mg/dL
Specific Gravity, Urine: 1.02 (ref 1.005–1.030)
Urobilinogen, UA: 1 mg/dL (ref 0.0–1.0)
pH: 7 (ref 5.0–8.0)

## 2012-09-15 NOTE — Patient Instructions (Signed)

## 2012-09-15 NOTE — Progress Notes (Signed)
Pulse- 81 Patient reports hip pain and lower abdominal/pelvic pressure

## 2012-09-15 NOTE — Progress Notes (Signed)
GBS and CT GC today. BG all normal, may stop testing

## 2012-09-22 ENCOUNTER — Ambulatory Visit (INDEPENDENT_AMBULATORY_CARE_PROVIDER_SITE_OTHER): Payer: Medicaid Other | Admitting: Family Medicine

## 2012-09-22 ENCOUNTER — Encounter: Payer: Self-pay | Admitting: Family Medicine

## 2012-09-22 VITALS — BP 115/78 | Temp 99.0°F | Wt 175.1 lb

## 2012-09-22 DIAGNOSIS — O09293 Supervision of pregnancy with other poor reproductive or obstetric history, third trimester: Secondary | ICD-10-CM

## 2012-09-22 DIAGNOSIS — Z22330 Carrier of Group B streptococcus: Secondary | ICD-10-CM

## 2012-09-22 DIAGNOSIS — O09899 Supervision of other high risk pregnancies, unspecified trimester: Secondary | ICD-10-CM

## 2012-09-22 DIAGNOSIS — O99820 Streptococcus B carrier state complicating pregnancy: Secondary | ICD-10-CM | POA: Insufficient documentation

## 2012-09-22 DIAGNOSIS — O09299 Supervision of pregnancy with other poor reproductive or obstetric history, unspecified trimester: Secondary | ICD-10-CM

## 2012-09-22 LAB — POCT URINALYSIS DIP (DEVICE)
Bilirubin Urine: NEGATIVE
Glucose, UA: NEGATIVE mg/dL
Hgb urine dipstick: NEGATIVE
Ketones, ur: NEGATIVE mg/dL
Nitrite: NEGATIVE
Specific Gravity, Urine: 1.015 (ref 1.005–1.030)

## 2012-09-22 NOTE — Progress Notes (Signed)
Pulse- 91 Pt reports loosing mucous plug

## 2012-09-22 NOTE — Patient Instructions (Signed)
Pregnancy - Third Trimester The third trimester of pregnancy (the last 3 months) is a period of the most rapid growth for you and your baby. The baby approaches a length of 20 inches and a weight of 6 to 10 pounds. The baby is adding on fat and getting ready for life outside your body. While inside, babies have periods of sleeping and waking, sucking thumbs, and hiccuping. You can often feel small contractions of the uterus. This is false labor. It is also called Braxton-Hicks contractions. This is like a practice for labor. The usual problems in this stage of pregnancy include more difficulty breathing, swelling of the hands and feet from water retention, and having to urinate more often because of the uterus and baby pressing on your bladder.  PRENATAL EXAMS  Blood work may continue to be done during prenatal exams. These tests are done to check on your health and the probable health of your baby. Blood work is used to follow your blood levels (hemoglobin). Anemia (low hemoglobin) is common during pregnancy. Iron and vitamins are given to help prevent this. You may also continue to be checked for diabetes. Some of the past blood tests may be done again.  The size of the uterus is measured during each visit. This makes sure your baby is growing properly according to your pregnancy dates.  Your blood pressure is checked every prenatal visit. This is to make sure you are not getting toxemia.  Your urine is checked every prenatal visit for infection, diabetes, and protein.  Your weight is checked at each visit. This is done to make sure gains are happening at the suggested rate and that you and your baby are growing normally.  Sometimes, an ultrasound is performed to confirm the position and the proper growth and development of the baby. This is a test done that bounces harmless sound waves off the baby so your caregiver can more accurately determine a due date.  Discuss the type of pain medicine and  anesthesia you will have during your labor and delivery.  Discuss the possibility and anesthesia if a cesarean section might be necessary.  Inform your caregiver if there is any mental or physical violence at home. Sometimes, a specialized non-stress test, contraction stress test, and biophysical profile are done to make sure the baby is not having a problem. Checking the amniotic fluid surrounding the baby is called an amniocentesis. The amniotic fluid is removed by sticking a needle into the belly (abdomen). This is sometimes done near the end of pregnancy if an early delivery is required. In this case, it is done to help make sure the baby's lungs are mature enough for the baby to live outside of the womb. If the lungs are not mature and it is unsafe to deliver the baby, an injection of cortisone medicine is given to the mother 1 to 2 days before the delivery. This helps the baby's lungs mature and makes it safer to deliver the baby. CHANGES OCCURING IN THE THIRD TRIMESTER OF PREGNANCY Your body goes through many changes during pregnancy. They vary from person to person. Talk to your caregiver about changes you notice and are concerned about.  During the last trimester, you have probably had an increase in your appetite. It is normal to have cravings for certain foods. This varies from person to person and pregnancy to pregnancy.  You may begin to get stretch marks on your hips, abdomen, and breasts. These are normal changes in the body   during pregnancy. There are no exercises or medicines to take which prevent this change.  Constipation may be treated with a stool softener or adding bulk to your diet. Drinking lots of fluids, fiber in vegetables, fruits, and whole grains are helpful.  Exercising is also helpful. If you have been very active up until your pregnancy, most of these activities can be continued during your pregnancy. If you have been less active, it is helpful to start an exercise  program such as walking. Consult your caregiver before starting exercise programs.  Avoid all smoking, alcohol, non-prescribed drugs, herbs and "street drugs" during your pregnancy. These chemicals affect the formation and growth of the baby. Avoid chemicals throughout the pregnancy to ensure the delivery of a healthy infant.  Backache, varicose veins, and hemorrhoids may develop or get worse.  You will tire more easily in the third trimester, which is normal.  The baby's movements may be stronger and more often.  You may become short of breath easily.  Your belly button may stick out.  A yellow discharge may leak from your breasts called colostrum.  You may have a bloody mucus discharge. This usually occurs a few days to a week before labor begins. HOME CARE INSTRUCTIONS   Keep your caregiver's appointments. Follow your caregiver's instructions regarding medicine use, exercise, and diet.  During pregnancy, you are providing food for you and your baby. Continue to eat regular, well-balanced meals. Choose foods such as meat, fish, milk and other low fat dairy products, vegetables, fruits, and whole-grain breads and cereals. Your caregiver will tell you of the ideal weight gain.  A physical sexual relationship may be continued throughout pregnancy if there are no other problems such as early (premature) leaking of amniotic fluid from the membranes, vaginal bleeding, or belly (abdominal) pain.  Exercise regularly if there are no restrictions. Check with your caregiver if you are unsure of the safety of your exercises. Greater weight gain will occur in the last 2 trimesters of pregnancy. Exercising helps:  Control your weight.  Get you in shape for labor and delivery.  You lose weight after you deliver.  Rest a lot with legs elevated, or as needed for leg cramps or low back pain.  Wear a good support or jogging bra for breast tenderness during pregnancy. This may help if worn during  sleep. Pads or tissues may be used in the bra if you are leaking colostrum.  Do not use hot tubs, steam rooms, or saunas.  Wear your seat belt when driving. This protects you and your baby if you are in an accident.  Avoid raw meat, cat litter boxes and soil used by cats. These carry germs that can cause birth defects in the baby.  It is easier to leak urine during pregnancy. Tightening up and strengthening the pelvic muscles will help with this problem. You can practice stopping your urination while you are going to the bathroom. These are the same muscles you need to strengthen. It is also the muscles you would use if you were trying to stop from passing gas. You can practice tightening these muscles up 10 times a set and repeating this about 3 times per day. Once you know what muscles to tighten up, do not perform these exercises during urination. It is more likely to cause an infection by backing up the urine.  Ask for help if you have financial, counseling, or nutritional needs during pregnancy. Your caregiver will be able to offer counseling for these   needs as well as refer you for other special needs.  Make a list of emergency phone numbers and have them available.  Plan on getting help from family or friends when you go home from the hospital.  Make a trial run to the hospital.  Take prenatal classes with the father to understand, practice, and ask questions about the labor and delivery.  Prepare the baby's room or nursery.  Do not travel out of the city unless it is absolutely necessary and with the advice of your caregiver.  Wear only low or no heal shoes to have better balance and prevent falling. MEDICINES AND DRUG USE IN PREGNANCY  Take prenatal vitamins as directed. The vitamin should contain 1 milligram of folic acid. Keep all vitamins out of reach of children. Only a couple vitamins or tablets containing iron may be fatal to a baby or young child when ingested.  Avoid use  of all medicines, including herbs, over-the-counter medicines, not prescribed or suggested by your caregiver. Only take over-the-counter or prescription medicines for pain, discomfort, or fever as directed by your caregiver. Do not use aspirin, ibuprofen or naproxen unless approved by your caregiver.  Let your caregiver also know about herbs you may be using.  Alcohol is related to a number of birth defects. This includes fetal alcohol syndrome. All alcohol, in any form, should be avoided completely. Smoking will cause low birth rate and premature babies.  Illegal drugs are very harmful to the baby. They are absolutely forbidden. A baby born to an addicted mother will be addicted at birth. The baby will go through the same withdrawal an adult does. SEEK MEDICAL CARE IF: You have any concerns or worries during your pregnancy. It is better to call with your questions if you feel they cannot wait, rather than worry about them. SEEK IMMEDIATE MEDICAL CARE IF:   An unexplained oral temperature above 102 F (38.9 C) develops, or as your caregiver suggests.  You have leaking of fluid from the vagina. If leaking membranes are suspected, take your temperature and tell your caregiver of this when you call.  There is vaginal spotting, bleeding or passing clots. Tell your caregiver of the amount and how many pads are used.  You develop a bad smelling vaginal discharge with a change in the color from clear to white.  You develop vomiting that lasts more than 24 hours.  You develop chills or fever.  You develop shortness of breath.  You develop burning on urination.  You loose more than 2 pounds of weight or gain more than 2 pounds of weight or as suggested by your caregiver.  You notice sudden swelling of your face, hands, and feet or legs.  You develop belly (abdominal) pain. Round ligament discomfort is a common non-cancerous (benign) cause of abdominal pain in pregnancy. Your caregiver still  must evaluate you.  You develop a severe headache that does not go away.  You develop visual problems, blurred or double vision.  If you have not felt your baby move for more than 1 hour. If you think the baby is not moving as much as usual, eat something with sugar in it and lie down on your left side for an hour. The baby should move at least 4 to 5 times per hour. Call right away if your baby moves less than that.  You fall, are in a car accident, or any kind of trauma.  There is mental or physical violence at home. Document Released: 01/22/2001   Document Revised: 10/23/2011 Document Reviewed: 07/27/2008 [employer] Patient Information 2014 [employer].  Breastfeeding A change in hormones during your pregnancy causes growth of your breast tissue and an increase in number and size of milk ducts. The hormone prolactin allows proteins, sugars, and fats from your blood supply to make breast milk in your milk-producing glands. The hormone progesterone prevents breast milk from being released before the birth of your baby. After the birth of your baby, your progesterone level decreases allowing breast milk to be released. Thoughts of your baby, as well as his or her sucking or crying, can stimulate the release of milk from the milk-producing glands. Deciding to breastfeed (nurse) is one of the best choices you can make for you and your baby. The information that follows gives a brief review of the benefits, as well as other important skills to know about breastfeeding. BENEFITS OF BREASTFEEDING For your baby  The first milk (colostrum) helps your baby's digestive system function better.   There are antibodies in your milk that help your baby fight off infections.   Your baby has a lower incidence of asthma, allergies, and sudden infant death syndrome (SIDS).   The nutrients in breast milk are better for your baby than infant formulas.  Breast milk improves your baby's brain development.    Your baby will have less gas, colic, and constipation.  Your baby is less likely to develop other conditions, such as childhood obesity, asthma, or diabetes mellitus. For you  Breastfeeding helps develop a very special bond between you and your baby.   Breastfeeding is convenient, always available at the correct temperature, and costs nothing.   Breastfeeding helps to burn calories and helps you lose the weight gained during pregnancy.   Breastfeeding makes your uterus contract back down to normal size faster and slows bleeding following delivery.   Breastfeeding mothers have a lower risk of developing osteoporosis or breast or ovarian cancer later in life.  BREASTFEEDING FREQUENCY  A healthy, full-term baby may breastfeed as often as every hour or space his or her feedings to every 3 hours. Breastfeeding frequency will vary from baby to baby.   Newborns should be fed no less than every 2 3 hours during the day and every 4 5 hours during the night. You should breastfeed a minimum of 8 feedings in a 24 hour period.  Awaken your baby to breastfeed if it has been 3 4 hours since the last feeding.  Breastfeed when you feel the need to reduce the fullness of your breasts or when your newborn shows signs of hunger. Signs that your baby may be hungry include:  Increased alertness or activity.  Stretching.  Movement of the head from side to side.  Movement of the head and opening of the mouth when the corner of the mouth or cheek is stroked (rooting).  Increased sucking sounds, smacking lips, cooing, sighing, or squeaking.  Hand-to-mouth movements.  Increased sucking of fingers or hands.  Fussing.  Intermittent crying.  Signs of extreme hunger will require calming and consoling before you try to feed your baby. Signs of extreme hunger may include:  Restlessness.  A loud, strong cry.  Screaming.  Frequent feeding will help you make more milk and will help prevent  problems, such as sore nipples and engorgement of the breasts.  BREASTFEEDING   Whether lying down or sitting, be sure that the baby's abdomen is facing your abdomen.   Support your breast with 4 fingers under your breast   and your thumb above your nipple. Make sure your fingers are well away from your nipple and your baby's mouth.   Stroke your baby's lips gently with your finger or nipple.   When your baby's mouth is open wide enough, place all of your nipple and as much of the colored area around your nipple (areola) as possible into your baby's mouth.  More areola should be visible above his or her upper lip than below his or her lower lip.  Your baby's tongue should be between his or her lower gum and your breast.  Ensure that your baby's mouth is correctly positioned around the nipple (latched). Your baby's lips should create a seal on your breast.  Signs that your baby has effectively latched onto your nipple include:  Tugging or sucking without pain.  Swallowing heard between sucks.  Absent click or smacking sound.  Muscle movement above and in front of his or her ears with sucking.  Your baby must suck about 2 3 minutes in order to get your milk. Allow your baby to feed on each breast as long as he or she wants. Nurse your baby until he or she unlatches or falls asleep at the first breast, then offer the second breast.  Signs that your baby is full and satisfied include:  A gradual decrease in the number of sucks or complete cessation of sucking.  Falling asleep.  Extension or relaxation of his or her body.  Retention of a small amount of milk in his or her mouth.  Letting go of your breast by himself or herself.  Signs of effective breastfeeding in you include:  Breasts that have increased firmness, weight, and size prior to feeding.  Breasts that are softer after nursing.  Increased milk volume, as well as a change in milk consistency and color by the 5th  day of breastfeeding.  Breast fullness relieved by breastfeeding.  Nipples are not sore, cracked, or bleeding.  If needed, break the suction by putting your finger into the corner of your baby's mouth and sliding your finger between his or her gums. Then, remove your breast from his or her mouth.  It is common for babies to spit up a small amount after a feeding.  Babies often swallow air during feeding. This can make babies fussy. Burping your baby between breasts can help with this.  Vitamin D supplements are recommended for babies who get only breast milk.  Avoid using a pacifier during your baby's first 4 6 weeks.  Avoid supplemental feedings of water, formula, or juice in place of breastfeeding. Breast milk is all the food your baby needs. It is not necessary for your baby to have water or formula. Your breasts will make more milk if supplemental feedings are avoided during the early weeks. HOW TO TELL WHETHER YOUR BABY IS GETTING ENOUGH BREAST MILK Wondering whether or not your baby is getting enough milk is a common concern among mothers. You can be assured that your baby is getting enough milk if:   Your baby is actively sucking and you hear swallowing.   Your baby seems relaxed and satisfied after a feeding.   Your baby nurses at least 8 12 times in a 24 hour time period.  During the first 3 5 days of age:  Your baby is wetting at least 3 5 diapers in a 24 hour period. The urine should be clear and pale yellow.  Your baby is having at least 3 4 stools in   a 24 hour period. The stool should be soft and yellow.  At 5 7 days of age, your baby is having at least 3 6 stools in a 24 hour period. The stool should be seedy and yellow by 5 days of age.  Your baby has a weight loss less than 7 10% during the first 3 days of age.  Your baby does not lose weight after 3 7 days of age.  Your baby gains 4 7 ounces each week after he or she is 4 days of age.  Your baby gains weight  by 5 days of age and is back to birth weight within 2 weeks. ENGORGEMENT In the first week after your baby is born, you may experience extremely full breasts (engorgement). When engorged, your breasts may feel heavy, warm, or tender to the touch. Engorgement peaks within 24 48 hours after delivery of your baby.  Engorgement may be reduced by:  Continuing to breastfeed.  Increasing the frequency of breastfeeding.  Taking warm showers or applying warm, moist heat to your breasts just before each feeding. This increases circulation and helps the milk flow.   Gently massaging your breast before and during the feedings. With your fingertips, massage from your chest wall towards your nipple in a circular motion.   Ensuring that your baby empties at least one breast at every feeding. It also helps to start the next feeding on the opposite breast.   Expressing breast milk by hand or by using a breast pump to empty the breasts if your baby is sleepy, or not nursing well. You may also want to express milk if you are returning to work oryou feel you are getting engorged.  Ensuring your baby is latched on and positioned properly while breastfeeding. If you follow these suggestions, your engorgement should improve in 24 48 hours. If you are still experiencing difficulty, call your lactation consultant or caregiver.  CARING FOR YOURSELF Take care of your breasts.  Bathe or shower daily.   Avoid using soap on your nipples.   Wear a supportive bra. Avoid wearing underwire style bras.  Air dry your nipples for a 3 4minutes after each feeding.   Use only cotton bra pads to absorb breast milk leakage. Leaking of breast milk between feedings is normal.   Use only pure lanolin on your nipples after nursing. You do not need to wash it off before feeding your baby again. Another option is to express a few drops of breast milk and gently massage that milk into your nipples.  Continue breast  self-awareness checks. Take care of yourself.  Eat healthy foods. Alternate 3 meals with 3 snacks.  Avoid foods that you notice affect your baby in a bad way.  Drink milk, fruit juice, and water to satisfy your thirst (about 8 glasses a day).   Rest often, relax, and take your prenatal vitamins to prevent fatigue, stress, and anemia.  Avoid chewing and smoking tobacco.  Avoid alcohol and drug use.  Take over-the-counter and prescribed medicine only as directed by your caregiver or pharmacist. You should always check with your caregiver or pharmacist before taking any new medicine, vitamin, or herbal supplement.  Know that pregnancy is possible while breastfeeding. If desired, talk to your caregiver about family planning and safe birth control methods that may be used while breastfeeding. SEEK MEDICAL CARE IF:   You feel like you want to stop breastfeeding or have become frustrated with breastfeeding.  You have painful breasts or nipples.    Your nipples are cracked or bleeding.  Your breasts are red, tender, or warm.  You have a swollen area on either breast.  You have a fever or chills.  You have nausea or vomiting.  You have drainage from your nipples.  Your breasts do not become full before feedings by the 5th day after delivery.  You feel sad and depressed.  Your baby is too sleepy to eat well.  Your baby is having trouble sleeping.   Your baby is wetting less than 3 diapers in a 24 hour period.  Your baby has less than 3 stools in a 24 hour period.  Your baby's skin or the white part of his or her eyes becomes more yellow.   Your baby is not gaining weight by 5 days of age. MAKE SURE YOU:   Understand these instructions.  Will watch your condition.  Will get help right away if you are not doing well or get worse. Document Released: 01/28/2005 Document Revised: 10/23/2011 Document Reviewed: 09/04/2011 [employer] Patient Information 2014 [employer].  

## 2012-09-22 NOTE — Progress Notes (Signed)
GBS positive discussed

## 2012-09-29 ENCOUNTER — Encounter: Payer: Self-pay | Admitting: Obstetrics & Gynecology

## 2012-09-29 ENCOUNTER — Ambulatory Visit (INDEPENDENT_AMBULATORY_CARE_PROVIDER_SITE_OTHER): Payer: Medicaid Other | Admitting: Obstetrics & Gynecology

## 2012-09-29 VITALS — BP 121/78 | Wt 176.8 lb

## 2012-09-29 DIAGNOSIS — O169 Unspecified maternal hypertension, unspecified trimester: Secondary | ICD-10-CM

## 2012-09-29 DIAGNOSIS — O09293 Supervision of pregnancy with other poor reproductive or obstetric history, third trimester: Secondary | ICD-10-CM

## 2012-09-29 DIAGNOSIS — O09299 Supervision of pregnancy with other poor reproductive or obstetric history, unspecified trimester: Secondary | ICD-10-CM

## 2012-09-29 DIAGNOSIS — O09529 Supervision of elderly multigravida, unspecified trimester: Secondary | ICD-10-CM

## 2012-09-29 LAB — POCT URINALYSIS DIP (DEVICE)
Bilirubin Urine: NEGATIVE
Hgb urine dipstick: NEGATIVE
Ketones, ur: NEGATIVE mg/dL
Specific Gravity, Urine: >=1.03 (ref 1.005–1.030)
pH: 7 (ref 5.0–8.0)

## 2012-09-29 NOTE — Progress Notes (Signed)
Routine visit. Good FM. Labor precautions reviewed. 

## 2012-09-29 NOTE — Progress Notes (Signed)
P = 81   Pt reports increased pelvic pressure and irregular UC's.  Pt does not want to breastfeed because "it hurts".

## 2012-10-06 ENCOUNTER — Ambulatory Visit (INDEPENDENT_AMBULATORY_CARE_PROVIDER_SITE_OTHER): Payer: Medicaid Other | Admitting: Obstetrics & Gynecology

## 2012-10-06 VITALS — BP 126/86 | Temp 97.7°F | Wt 178.5 lb

## 2012-10-06 DIAGNOSIS — O09529 Supervision of elderly multigravida, unspecified trimester: Secondary | ICD-10-CM

## 2012-10-06 DIAGNOSIS — O09523 Supervision of elderly multigravida, third trimester: Secondary | ICD-10-CM

## 2012-10-06 DIAGNOSIS — O09293 Supervision of pregnancy with other poor reproductive or obstetric history, third trimester: Secondary | ICD-10-CM

## 2012-10-06 DIAGNOSIS — O0933 Supervision of pregnancy with insufficient antenatal care, third trimester: Secondary | ICD-10-CM

## 2012-10-06 DIAGNOSIS — Z22330 Carrier of Group B streptococcus: Secondary | ICD-10-CM

## 2012-10-06 DIAGNOSIS — O09899 Supervision of other high risk pregnancies, unspecified trimester: Secondary | ICD-10-CM

## 2012-10-06 DIAGNOSIS — Z23 Encounter for immunization: Secondary | ICD-10-CM

## 2012-10-06 DIAGNOSIS — O99820 Streptococcus B carrier state complicating pregnancy: Secondary | ICD-10-CM

## 2012-10-06 DIAGNOSIS — O09299 Supervision of pregnancy with other poor reproductive or obstetric history, unspecified trimester: Secondary | ICD-10-CM

## 2012-10-06 DIAGNOSIS — O0930 Supervision of pregnancy with insufficient antenatal care, unspecified trimester: Secondary | ICD-10-CM

## 2012-10-06 LAB — POCT URINALYSIS DIP (DEVICE)
Hgb urine dipstick: NEGATIVE
Ketones, ur: NEGATIVE mg/dL
Protein, ur: NEGATIVE mg/dL
Specific Gravity, Urine: 1.02 (ref 1.005–1.030)

## 2012-10-06 MED ORDER — TETANUS-DIPHTH-ACELL PERTUSSIS 5-2.5-18.5 LF-MCG/0.5 IM SUSP
0.5000 mL | Freq: Once | INTRAMUSCULAR | Status: AC
  Administered 2012-10-06: 0.5 mL via INTRAMUSCULAR

## 2012-10-06 NOTE — Progress Notes (Signed)
Counseled about Tdap, will receive today.  No other complaints or concerns.  Fetal movement and labor precautions reviewed. Cervical check next visit.

## 2012-10-06 NOTE — Patient Instructions (Signed)
Return to clinic for any obstetric concerns or go to [facility] for evaluation  

## 2012-10-06 NOTE — Progress Notes (Signed)
Pulse- 83 Pain- front of stomach

## 2012-10-11 ENCOUNTER — Encounter (HOSPITAL_COMMUNITY): Payer: Self-pay

## 2012-10-11 ENCOUNTER — Encounter (HOSPITAL_COMMUNITY): Payer: Self-pay | Admitting: Anesthesiology

## 2012-10-11 ENCOUNTER — Inpatient Hospital Stay (HOSPITAL_COMMUNITY): Payer: Medicaid Other | Admitting: Anesthesiology

## 2012-10-11 ENCOUNTER — Inpatient Hospital Stay (HOSPITAL_COMMUNITY)
Admission: AD | Admit: 2012-10-11 | Discharge: 2012-10-13 | DRG: 775 | Disposition: A | Discharge status: Home / Self Care | Payer: Medicaid Other | Source: Ambulatory Visit | Attending: Obstetrics & Gynecology | Admitting: Obstetrics & Gynecology

## 2012-10-11 DIAGNOSIS — O169 Unspecified maternal hypertension, unspecified trimester: Secondary | ICD-10-CM

## 2012-10-11 DIAGNOSIS — O99892 Other specified diseases and conditions complicating childbirth: Secondary | ICD-10-CM | POA: Diagnosis present

## 2012-10-11 DIAGNOSIS — O09293 Supervision of pregnancy with other poor reproductive or obstetric history, third trimester: Secondary | ICD-10-CM

## 2012-10-11 DIAGNOSIS — Z22330 Carrier of Group B streptococcus: Secondary | ICD-10-CM

## 2012-10-11 DIAGNOSIS — O99820 Streptococcus B carrier state complicating pregnancy: Secondary | ICD-10-CM

## 2012-10-11 DIAGNOSIS — O09529 Supervision of elderly multigravida, unspecified trimester: Secondary | ICD-10-CM | POA: Diagnosis present

## 2012-10-11 LAB — CBC
Hemoglobin: 14.2 g/dL (ref 12.0–15.0)
MCH: 31.1 pg (ref 26.0–34.0)
MCV: 88.6 fL (ref 78.0–100.0)
RBC: 4.57 MIL/uL (ref 3.87–5.11)

## 2012-10-11 MED ORDER — OXYCODONE-ACETAMINOPHEN 5-325 MG PO TABS: 1.0000 | ORAL_TABLET | ORAL | Status: DC | PRN

## 2012-10-11 MED ORDER — EPHEDRINE 5 MG/ML INJ
10.0000 mg | INTRAVENOUS | Status: DC | PRN
  Filled 2012-10-11: qty 2
  Filled 2012-10-11: qty 4

## 2012-10-11 MED ORDER — ONDANSETRON HCL 4 MG PO TABS: 4.0000 mg | ORAL_TABLET | ORAL | Status: DC | PRN

## 2012-10-11 MED ORDER — SODIUM CHLORIDE 0.9 % IV SOLN
2.0000 g | Freq: Once | INTRAVENOUS | Status: AC
  Administered 2012-10-11: 2 g via INTRAVENOUS
  Filled 2012-10-11: qty 2000

## 2012-10-11 MED ORDER — IBUPROFEN 600 MG PO TABS
600.0000 mg | ORAL_TABLET | Freq: Four times a day (QID) | ORAL | Status: DC
  Administered 2012-10-11 – 2012-10-13 (×6): 600 mg via ORAL
  Filled 2012-10-11 (×2): qty 1

## 2012-10-11 MED ORDER — FENTANYL CITRATE 0.05 MG/ML IJ SOLN
INTRAMUSCULAR | Status: AC
  Administered 2012-10-11: 100 ug
  Filled 2012-10-11: qty 2

## 2012-10-11 MED ORDER — ZOLPIDEM TARTRATE 5 MG PO TABS: 5.0000 mg | ORAL_TABLET | Freq: Every evening | ORAL | Status: DC | PRN

## 2012-10-11 MED ORDER — SENNOSIDES-DOCUSATE SODIUM 8.6-50 MG PO TABS
2.0000 | ORAL_TABLET | Freq: Every day | ORAL | Status: DC
  Administered 2012-10-11: 2 via ORAL

## 2012-10-11 MED ORDER — EPHEDRINE 5 MG/ML INJ
10.0000 mg | INTRAVENOUS | Status: DC | PRN
  Filled 2012-10-11: qty 2

## 2012-10-11 MED ORDER — TETANUS-DIPHTH-ACELL PERTUSSIS 5-2.5-18.5 LF-MCG/0.5 IM SUSP: 0.5000 mL | Freq: Once | INTRAMUSCULAR | Status: DC

## 2012-10-11 MED ORDER — FENTANYL CITRATE 0.05 MG/ML IJ SOLN: 100.0000 ug | INTRAMUSCULAR | Status: DC | PRN

## 2012-10-11 MED ORDER — PHENYLEPHRINE 40 MCG/ML (10ML) SYRINGE FOR IV PUSH (FOR BLOOD PRESSURE SUPPORT)
80.0000 ug | PREFILLED_SYRINGE | INTRAVENOUS | Status: DC | PRN
  Filled 2012-10-11: qty 2
  Filled 2012-10-11 (×2): qty 5

## 2012-10-11 MED ORDER — PHENYLEPHRINE 40 MCG/ML (10ML) SYRINGE FOR IV PUSH (FOR BLOOD PRESSURE SUPPORT)
80.0000 ug | PREFILLED_SYRINGE | INTRAVENOUS | Status: AC | PRN
  Administered 2012-10-11 (×3): 80 ug via INTRAVENOUS

## 2012-10-11 MED ORDER — LANOLIN HYDROUS EX OINT: TOPICAL_OINTMENT | CUTANEOUS | Status: DC | PRN

## 2012-10-11 MED ORDER — ONDANSETRON HCL 4 MG/2ML IJ SOLN: 4.0000 mg | INTRAMUSCULAR | Status: DC | PRN

## 2012-10-11 MED ORDER — IBUPROFEN 600 MG PO TABS
600.0000 mg | ORAL_TABLET | Freq: Four times a day (QID) | ORAL | Status: DC | PRN
  Administered 2012-10-12 – 2012-10-13 (×2): 600 mg via ORAL
  Filled 2012-10-11 (×5): qty 1

## 2012-10-11 MED ORDER — LACTATED RINGERS IV SOLN
500.0000 mL | Freq: Once | INTRAVENOUS | Status: AC
  Administered 2012-10-11: 500 mL via INTRAVENOUS

## 2012-10-11 MED ORDER — LACTATED RINGERS IV SOLN: 500.0000 mL | INTRAVENOUS | Status: DC | PRN

## 2012-10-11 MED ORDER — CITRIC ACID-SODIUM CITRATE 334-500 MG/5ML PO SOLN: 30.0000 mL | ORAL | Status: DC | PRN

## 2012-10-11 MED ORDER — SIMETHICONE 80 MG PO CHEW
80.0000 mg | CHEWABLE_TABLET | ORAL | Status: DC | PRN
  Administered 2012-10-12: 80 mg via ORAL

## 2012-10-11 MED ORDER — DIPHENHYDRAMINE HCL 50 MG/ML IJ SOLN
12.5000 mg | INTRAMUSCULAR | Status: DC | PRN
  Administered 2012-10-11: 12.5 mg via INTRAVENOUS
  Filled 2012-10-11: qty 1

## 2012-10-11 MED ORDER — SODIUM CHLORIDE 0.9 % IV SOLN: 2.0000 g | Freq: Four times a day (QID) | INTRAVENOUS | Status: DC

## 2012-10-11 MED ORDER — OXYTOCIN 40 UNITS IN LACTATED RINGERS INFUSION - SIMPLE MED
62.5000 mL/h | INTRAVENOUS | Status: DC
  Filled 2012-10-11: qty 1000

## 2012-10-11 MED ORDER — ONDANSETRON HCL 4 MG/2ML IJ SOLN: 4.0000 mg | Freq: Four times a day (QID) | INTRAMUSCULAR | Status: DC | PRN

## 2012-10-11 MED ORDER — OXYTOCIN BOLUS FROM INFUSION: 500.0000 mL | INTRAVENOUS | Status: DC

## 2012-10-11 MED ORDER — DIBUCAINE 1 % RE OINT: 1.0000 "application " | TOPICAL_OINTMENT | RECTAL | Status: DC | PRN

## 2012-10-11 MED ORDER — PENICILLIN G POTASSIUM 5000000 UNITS IJ SOLR
2.5000 10*6.[IU] | INTRAVENOUS | Status: DC
  Filled 2012-10-11 (×3): qty 2.5

## 2012-10-11 MED ORDER — DIPHENHYDRAMINE HCL 25 MG PO CAPS: 25.0000 mg | ORAL_CAPSULE | Freq: Four times a day (QID) | ORAL | Status: DC | PRN

## 2012-10-11 MED ORDER — OXYCODONE-ACETAMINOPHEN 5-325 MG PO TABS
1.0000 | ORAL_TABLET | ORAL | Status: DC | PRN
  Administered 2012-10-12: 1 via ORAL
  Filled 2012-10-11: qty 1

## 2012-10-11 MED ORDER — LACTATED RINGERS IV SOLN
INTRAVENOUS | Status: DC
  Administered 2012-10-11 (×2): via INTRAVENOUS

## 2012-10-11 MED ORDER — SODIUM BICARBONATE 8.4 % IV SOLN
INTRAVENOUS | Status: DC | PRN
  Administered 2012-10-11: 5 mL via EPIDURAL

## 2012-10-11 MED ORDER — FENTANYL 2.5 MCG/ML BUPIVACAINE 1/10 % EPIDURAL INFUSION (WH - ANES)
14.0000 mL/h | INTRAMUSCULAR | Status: DC | PRN
  Administered 2012-10-11: 14 mL/h via EPIDURAL
  Filled 2012-10-11: qty 125

## 2012-10-11 MED ORDER — ACETAMINOPHEN 325 MG PO TABS: 650.0000 mg | ORAL_TABLET | ORAL | Status: DC | PRN

## 2012-10-11 MED ORDER — BENZOCAINE-MENTHOL 20-0.5 % EX AERO
1.0000 "application " | INHALATION_SPRAY | CUTANEOUS | Status: DC | PRN
  Administered 2012-10-11: 1 via TOPICAL
  Filled 2012-10-11: qty 56

## 2012-10-11 MED ORDER — PRENATAL MULTIVITAMIN CH
1.0000 | ORAL_TABLET | Freq: Every day | ORAL | Status: DC
  Administered 2012-10-12 – 2012-10-13 (×2): 1 via ORAL
  Filled 2012-10-11 (×2): qty 1

## 2012-10-11 MED ORDER — WITCH HAZEL-GLYCERIN EX PADS: 1.0000 "application " | MEDICATED_PAD | CUTANEOUS | Status: DC | PRN

## 2012-10-11 MED ORDER — LIDOCAINE HCL (PF) 1 % IJ SOLN
30.0000 mL | INTRAMUSCULAR | Status: DC | PRN
  Filled 2012-10-11 (×2): qty 30

## 2012-10-11 MED ORDER — PENICILLIN G POTASSIUM 5000000 UNITS IJ SOLR
5.0000 10*6.[IU] | Freq: Once | INTRAVENOUS | Status: AC
  Administered 2012-10-11: 5 10*6.[IU] via INTRAVENOUS
  Filled 2012-10-11: qty 5

## 2012-10-11 NOTE — [facility] Note (Signed)
Pt reports she has bee having ctx q10 min since 2am. Reports some bloody show. Good fetal movement felt.

## 2012-10-11 NOTE — H&P (Signed)
Aribella Vavra is a 37 y.o. female presenting for SOL. +CT, no lof no vb +FM. Maternal Medical History:  Reason for admission: Nausea.    OB History   Grav Para Term Preterm Abortions TAB SAB Ect Mult Living   3 2 2  0 0 0 0 0 0 2     Past Medical History  Diagnosis Date  . Morbid obesity   . Hypertension   . Diabetes mellitus without complication    Past Surgical History  Procedure Laterality Date  . No past surgeries     Family History: family history includes Diabetes in her mother. Social History:  reports that she has never smoked. She has never used smokeless tobacco. She reports that she does not drink alcohol or use illicit drugs.   Prenatal Transfer Tool  Maternal Diabetes: No Genetic Screening: too late to care  Maternal Ultrasounds/Referrals: Normal Fetal Ultrasounds or other Referrals:  None Maternal Substance Abuse:  No Significant Maternal Medications:  None Significant Maternal Lab Results:  Lab values include: Group B Strep positive Other Comments:  None  Review of Systems  Constitutional: Negative for fever and chills.  Eyes: Negative for blurred vision.  Respiratory: Negative for cough.   Cardiovascular: Negative for chest pain.  Gastrointestinal: Positive for abdominal pain (ctx). Negative for nausea and vomiting.  Neurological: Negative for headaches.    Dilation: 10 Effacement (%): 80 Station: +1 Exam by:: Dr.Cordell Guercio Blood pressure 113/51, pulse 76, temperature 98 F (36.7 C), temperature source Oral, resp. rate 18, height 5\' 1"  (1.549 m), weight 80.65 kg (177 lb 12.8 oz), last menstrual period 01/12/2012, SpO2 100.00%. Exam Physical Exam  Nursing note and vitals reviewed. Constitutional: She appears well-developed and well-nourished. No distress.  HENT:  Head: Normocephalic and atraumatic.  Eyes: Conjunctivae are normal.  Cardiovascular: Normal rate, regular rhythm, normal heart sounds and intact distal pulses.  Exam reveals no gallop and no  friction rub.   No murmur heard. Respiratory: Effort normal and breath sounds normal. No respiratory distress. She has no wheezes. She has no rales. She exhibits no tenderness.  GI: Soft. Bowel sounds are normal. She exhibits no distension (gravid vtx by [place]) and no mass. There is no tenderness. There is no rebound and no guarding.  Skin: She is not diaphoretic.    Dilation: 10 Dilation Complete Date: 10/11/12 Dilation Complete Time: 1747 Effacement (%): 80 Cervical Position: Middle Station: +1 Presentation: Vertex Exam by:: Dr.Iviana Blasingame   Prenatal labs: ABO, Rh: B/NEG/-- (07/16 1250) Antibody: NEG (07/16 1250) Rubella: 2.27 (07/16 1250) RPR: NON REAC (07/16 1250)  HBsAg: NEGATIVE (07/16 1250)  HIV:    GBS:   GBS +  Assessment/Plan: Mozel Burdett is a 37 y.o. G3P2002 at [redacted]w[redacted]d in Active labor  #Labor:SOOL expectant managment #Pain: epidural #FWB: cat II, after epidural, improving now #ID: GBS + #MOF: Bottle #MOC: BTL    Apryle Stowell RYAN 10/11/2012, 5:56 PM

## 2012-10-11 NOTE — Progress Notes (Signed)
Dr. Ike Bene notified of pt's cervical exam, early/variable decels with u/c's q5 minutes, will come see pt and place admission orders.

## 2012-10-11 NOTE — Anesthesia Preprocedure Evaluation (Signed)
Anesthesia Evaluation  Patient identified by MRN, date of birth, ID band Patient awake    Reviewed: Allergy & Precautions, H&P , Patient's Chart, lab work & pertinent test results  Airway Mallampati: II TM Distance: >3 FB Neck ROM: full    Dental  (+) Teeth Intact   Pulmonary  breath sounds clear to auscultation        Cardiovascular Rhythm:regular Rate:Normal     Neuro/Psych    GI/Hepatic   Endo/Other    Renal/GU      Musculoskeletal   Abdominal   Peds  Hematology   Anesthesia Other Findings Morbid obesity .....Marland KitchenMarland KitchenIn past? Now 34 BMI     Hypertension   ......... NO Meds     Diabetes mellitus without complication  ..... No Meds               Reproductive/Obstetrics (+) Pregnancy                           Anesthesia Physical Anesthesia Plan  ASA: II  Anesthesia Plan: Epidural   Post-op Pain Management:    Induction:   Airway Management Planned:   Additional Equipment:   Intra-op Plan:   Post-operative Plan:   Informed Consent: I have reviewed the patients History and Physical, chart, labs and discussed the procedure including the risks, benefits and alternatives for the proposed anesthesia with the patient or authorized representative who has indicated his/her understanding and acceptance.   Dental Advisory Given  Plan Discussed with:   Anesthesia Plan Comments: (Labs checked- platelets confirmed with RN in room. Fetal heart tracing, per RN, reported to be stable enough for sitting procedure. Discussed epidural, and patient consents to the procedure:  included risk of possible headache,backache, failed block, allergic reaction, and nerve injury. This patient was asked if she had any questions or concerns before the procedure started. )        Anesthesia Quick Evaluation

## 2012-10-11 NOTE — Anesthesia Procedure Notes (Signed)
Epidural Patient location during procedure: OB  Preanesthetic Checklist Completed: patient identified, site marked, surgical consent, pre-op evaluation, timeout performed, IV checked, risks and benefits discussed and monitors and equipment checked  Epidural Patient position: sitting Prep: site prepped and draped and DuraPrep Patient monitoring: continuous pulse ox and blood pressure Approach: midline Injection technique: LOR air  Needle:  Needle type: Tuohy  Needle gauge: 17 G Needle length: 9 cm and 9 Needle insertion depth: 6 cm Catheter type: closed end flexible Catheter size: 19 Gauge Catheter at skin depth: 12 cm Test dose: negative  Assessment Events: blood not aspirated, injection not painful, no injection resistance, negative IV test and no paresthesia  Additional Notes Dosing of Epidural:  1st dose, through catheter ............................................. epi 1:200K + Xylocaine 40 mg  2nd dose, through catheter, after waiting 3 minutes.....epi 1:200K + Xylocaine 60 mg    ( 2% Xylo charted as a single dose in Epic Meds for ease of charting; actual dosing was fractionated as above, for saftey's sake)  As each dose occurred, patient was free of IV sx; and patient exhibited no evidence of SA injection.  Patient is more comfortable after epidural dosed. Please see RN's note for documentation of vital signs,and FHR which are stable.  Patient reminded not to try to ambulate with numb legs, and that an RN must be present when she attempts to get up.       

## 2012-10-12 ENCOUNTER — Encounter (HOSPITAL_COMMUNITY): Payer: Self-pay | Admitting: Anesthesiology

## 2012-10-12 ENCOUNTER — Encounter (HOSPITAL_COMMUNITY)
Admission: AD | Disposition: A | Discharge status: Home / Self Care | Payer: Self-pay | Source: Ambulatory Visit | Attending: Obstetrics & Gynecology

## 2012-10-12 DIAGNOSIS — O09529 Supervision of elderly multigravida, unspecified trimester: Secondary | ICD-10-CM

## 2012-10-12 DIAGNOSIS — O9989 Other specified diseases and conditions complicating pregnancy, childbirth and the puerperium: Secondary | ICD-10-CM

## 2012-10-12 LAB — CBC
MCH: 31 pg (ref 26.0–34.0)
MCHC: 35 g/dL (ref 30.0–36.0)
MCV: 88.7 fL (ref 78.0–100.0)
Platelets: 151 10*3/uL (ref 150–400)
RDW: 14 % (ref 11.5–15.5)

## 2012-10-12 LAB — SURGICAL PCR SCREEN: MRSA, PCR: NEGATIVE

## 2012-10-12 SURGERY — LIGATION, FALLOPIAN TUBE, POSTPARTUM
Anesthesia: Epidural | Laterality: Bilateral

## 2012-10-12 MED ORDER — METOCLOPRAMIDE HCL 10 MG PO TABS: 10.0000 mg | ORAL_TABLET | Freq: Once | ORAL | Status: DC

## 2012-10-12 MED ORDER — FAMOTIDINE 20 MG PO TABS: 40.0000 mg | ORAL_TABLET | Freq: Once | ORAL | Status: DC

## 2012-10-12 MED ORDER — LACTATED RINGERS IV SOLN: INTRAVENOUS | Status: DC

## 2012-10-12 SURGICAL SUPPLY — 18 items
CHLORAPREP W/TINT 26ML (MISCELLANEOUS) IMPLANT
CONTAINER PREFILL 10% NBF 15ML (MISCELLANEOUS) IMPLANT
GLOVE BIOGEL PI IND STRL 6.5 (GLOVE) IMPLANT
GLOVE BIOGEL PI INDICATOR 6.5 (GLOVE)
GLOVE SURG SS PI 6.0 STRL IVOR (GLOVE) IMPLANT
GOWN PREVENTION PLUS LG XLONG (DISPOSABLE) IMPLANT
NEEDLE HYPO 25X1 1.5 SAFETY (NEEDLE) IMPLANT
NS IRRIG 1000ML POUR BTL (IV SOLUTION) IMPLANT
PACK ABDOMINAL MINOR (CUSTOM PROCEDURE TRAY) IMPLANT
SPONGE LAP 4X18 X RAY DECT (DISPOSABLE) IMPLANT
SUT PLAIN 0 NONE (SUTURE) IMPLANT
SUT VIC AB 0 CT1 27 (SUTURE)
SUT VIC AB 0 CT1 27XBRD ANBCTR (SUTURE) IMPLANT
SUT VIC AB 3-0 PS2 18 (SUTURE) IMPLANT
SYR CONTROL 10ML LL (SYRINGE) IMPLANT
TOWEL OR 17X24 6PK STRL BLUE (TOWEL DISPOSABLE) IMPLANT
TRAY FOLEY CATH 14FR (SET/KITS/TRAYS/PACK) IMPLANT
WATER STERILE IRR 1000ML POUR (IV SOLUTION) IMPLANT

## 2012-10-12 NOTE — Anesthesia Postprocedure Evaluation (Signed)
  Anesthesia Post-op Note  Patient: [name]  Procedure(s) Performed: * No procedures listed *  Patient Location: PACU and Mother/Baby  Anesthesia Type:Epidural  Level of Consciousness: awake, alert  and oriented  Airway and Oxygen Therapy: Patient Spontanous Breathing  Post-op Pain: mild  Post-op Assessment: Patient's Cardiovascular Status Stable, Respiratory Function Stable, No signs of Nausea or vomiting, Adequate PO intake, Pain level controlled, No headache, No backache, No residual numbness and No residual motor weakness  Post-op Vital Signs: stable  Complications: No apparent anesthesia complications

## 2012-10-12 NOTE — Clinical Social Work Maternal (Signed)
    Clinical Social Work Department PSYCHOSOCIAL ASSESSMENT - MATERNAL/CHILD 10/12/2012  Patient:  [name], [name]  Account Number:  [id number]  Admit Date:  10/11/2012  Marjo Bicker Name:   [name]    Clinical Social Worker:  Nobie Putnam, LCSW   Date/Time:  10/12/2012 10:12 AM  Date Referred:  10/12/2012   Referral source  CN     Referred reason  [facility]   Other referral source:    I:  FAMILY / HOME ENVIRONMENT Child's legal guardian:  PARENT  Guardian - Name Guardian - Age Guardian - Address  [name] 36 [address] [place] [place], [place] [address]  [name] 37 (same as above)   Other household support members/support persons Name Relationship DOB   DAUGHTER 106 years old   SON 32 year old   Other support:   [name], pt's sister    II  PSYCHOSOCIAL DATA Information Source:  Patient Interview  Event organiser Employment:   Surveyor, quantity resources:  [employer] If Medicaid - County:  [employer] Probation officer   School / Grade:   Maternity Care Coordinator / Child Services Coordination / Early Interventions:  Cultural issues impacting care:    III  STRENGTHS Strengths  Adequate Resources  Home prepared for Child (including basic supplies)  Supportive family/friends   Strength comment:    IV  RISK FACTORS AND CURRENT PROBLEMS Current Problem:  YES   Risk Factor & Current Problem Patient Issue Family Issue Risk Factor / Current Problem Comment  Other - See comment Y N LPNC @ 32 weeks    V  SOCIAL WORK ASSESSMENT CSW met with pt to assess reason for [facility] @ 32 weeks.  Pt did not know she was pregnant until 5 months.  After pregnancy was confirmed, she applied for Medicaid but had to wait for application to be processed.  Before she was able to establish care, she started taking prenatal vitamins.  She denies any illegal substance use & verbalized understanding of hospital drug testing policy.  UDS is negative,  meconium results are pending.  Pt has all the necessary supplies for the infant & appears to be bonding well.  She was accompanied by her sister, who she identified as her primary support person.  CSW will continue to monitor drug screen results & make a referral if needed.      VI SOCIAL WORK PLAN Social Work Plan  No Further Intervention Required / No Barriers to Discharge   Type of pt/family education:   If child protective services report - county:   If child protective services report - date:   Information/referral to community resources comment:   Other social work plan:

## 2012-10-12 NOTE — Progress Notes (Signed)
UR chart review completed.  

## 2012-10-12 NOTE — Anesthesia Preprocedure Evaluation (Deleted)
Anesthesia Evaluation  Patient identified by MRN, date of birth, ID band Patient awake    Reviewed: Allergy & Precautions, H&P , NPO status , Patient's Chart, lab work & pertinent test results  Airway Mallampati: II TM Distance: >3 FB Neck ROM: full    Dental no notable dental hx. (+) Teeth Intact   Pulmonary neg pulmonary ROS,  breath sounds clear to auscultation  Pulmonary exam normal       Cardiovascular hypertension, negative cardio ROS  Rhythm:regular Rate:Normal     Neuro/Psych negative neurological ROS  negative psych ROS   GI/Hepatic negative GI ROS, Neg liver ROS,   Endo/Other  negative endocrine ROSdiabetes  Renal/GU negative Renal ROS  negative genitourinary   Musculoskeletal   Abdominal Normal abdominal exam  (+)   Peds  Hematology negative hematology ROS (+)   Anesthesia Other Findings Morbid obesity .....Marland KitchenMarland KitchenIn past? Now 34 BMI     Hypertension   ......... NO Meds     Diabetes mellitus without complication  ..... No Meds               Reproductive/Obstetrics negative OB ROS                           Anesthesia Physical  Anesthesia Plan  ASA: II  Anesthesia Plan: Epidural   Post-op Pain Management:    Induction:   Airway Management Planned:   Additional Equipment:   Intra-op Plan:   Post-operative Plan:   Informed Consent: I have reviewed the patients History and Physical, chart, labs and discussed the procedure including the risks, benefits and alternatives for the proposed anesthesia with the patient or authorized representative who has indicated his/her understanding and acceptance.   Dental Advisory Given  Plan Discussed with: Surgeon, CRNA and Anesthesiologist  Anesthesia Plan Comments: (Labs checked- platelets confirmed with RN in room. Fetal heart tracing, per RN, reported to be stable enough for sitting procedure. Discussed epidural, and patient  consents to the procedure:  included risk of possible headache,backache, failed block, allergic reaction, and nerve injury. This patient was asked if she had any questions or concerns before the procedure started. )        Anesthesia Quick Evaluation

## 2012-10-13 ENCOUNTER — Encounter: Payer: Medicaid Other | Admitting: Obstetrics & Gynecology

## 2012-10-13 MED ORDER — SODIUM BICARBONATE 8.4 % IV SOLN
INTRAVENOUS | Status: AC
  Filled 2012-10-13: qty 50

## 2012-10-13 MED ORDER — LIDOCAINE-EPINEPHRINE (PF) 2 %-1:200000 IJ SOLN
INTRAMUSCULAR | Status: AC
  Filled 2012-10-13: qty 20

## 2012-10-13 MED ORDER — MORPHINE SULFATE 0.5 MG/ML IJ SOLN
INTRAMUSCULAR | Status: AC
  Filled 2012-10-13: qty 10

## 2012-10-13 MED ORDER — MEPERIDINE HCL 25 MG/ML IJ SOLN
INTRAMUSCULAR | Status: AC
  Filled 2012-10-13: qty 1

## 2012-10-13 MED ORDER — DEXTROSE 5 % IV SOLN
INTRAVENOUS | Status: AC
  Filled 2012-10-13: qty 3000

## 2012-10-13 MED ORDER — IBUPROFEN 600 MG PO TABS: 600.0000 mg | ORAL_TABLET | Freq: Four times a day (QID) | ORAL | Status: DC | PRN

## 2012-10-13 MED ORDER — OXYTOCIN 10 UNIT/ML IJ SOLN
INTRAMUSCULAR | Status: AC
  Filled 2012-10-13: qty 4

## 2012-10-13 MED ORDER — ONDANSETRON HCL 4 MG/2ML IJ SOLN
INTRAMUSCULAR | Status: AC
Start: 2012-10-13 — End: 2012-10-13
  Filled 2012-10-13: qty 2

## 2012-10-13 NOTE — Discharge Summary (Signed)
Obstetric Discharge Summary Reason for Admission: onset of labor Prenatal Procedures: ultrasound Intrapartum Procedures: spontaneous vaginal delivery Postpartum Procedures: none Complications-Operative and Postpartum: none Hemoglobin  Date Value Range Status  10/12/2012 13.5  12.0 - 15.0 g/dL Final     HCT  Date Value Range Status  10/12/2012 38.6  36.0 - 46.0 % Final   [name] arrived to the hospital dilated to 10cm. She received an epidural. She delivered with no complications. There was a tight nuchal cord, which was reduced after delivery. [name] changed her mind about having a BTL and prefers depo for contraception. She is bottlefeeding only. She is doing well overall.    Physical Exam:  General: alert, cooperative and no distress Lochia: appropriate Uterine Fundus: firm Incision: n/a DVT Evaluation: No evidence of DVT seen on physical exam.  Discharge Diagnoses: Term Pregnancy-delivered  Discharge Information: Date: 10/13/2012 Activity: pelvic rest Diet: routine Medications: PNV and Ibuprofen Condition: stable Instructions: refer to practice specific booklet Discharge to: home Follow-up Information   Follow up with [facility]. Schedule an appointment as soon as possible for a visit in 5 weeks. (Postpartum follow-up)    Specialty:  Obstetrics and Gynecology   Contact information:   [address] [place] [address] [phone]      Newborn Data: Live born female  Birth Weight: 6 lb 6.3 oz (2900 g) APGAR: 9, 9  Home with mother.  Jacquelin Hawking, MD 10/13/2012, 9:19 AM  I have seen and examined this patient and agree with above documentation in the resident's note. Pt did not receive BTL as changed her mind. On depo for contraception.   Rulon Abide, M.D. [facility] Fellow 10/13/2012 12:41 PM

## 2012-10-13 NOTE — Discharge Summary (Signed)
Attestation of Attending Supervision of Fellow: Evaluation and management procedures were performed by the Fellow under my supervision and collaboration.  I have reviewed the Fellow's note and chart, and I agree with the management and plan.    

## 2012-10-28 ENCOUNTER — Encounter: Payer: Self-pay | Admitting: *Deleted

## 2012-12-23 ENCOUNTER — Ambulatory Visit: Payer: Medicaid Other | Admitting: Obstetrics and Gynecology

## 2013-02-12 ENCOUNTER — Encounter (HOSPITAL_COMMUNITY): Payer: Self-pay | Admitting: Emergency Medicine

## 2013-02-12 ENCOUNTER — Emergency Department (INDEPENDENT_AMBULATORY_CARE_PROVIDER_SITE_OTHER)
Admission: EM | Admit: 2013-02-12 | Discharge: 2013-02-12 | Disposition: A | Discharge status: Home / Self Care | Payer: Self-pay | Source: Home / Self Care

## 2013-02-12 DIAGNOSIS — J45901 Unspecified asthma with (acute) exacerbation: Secondary | ICD-10-CM

## 2013-02-12 DIAGNOSIS — J329 Chronic sinusitis, unspecified: Secondary | ICD-10-CM

## 2013-02-12 DIAGNOSIS — I10 Essential (primary) hypertension: Secondary | ICD-10-CM

## 2013-02-12 MED ORDER — TRIAMCINOLONE ACETONIDE 40 MG/ML IJ SUSP
INTRAMUSCULAR | Status: AC
  Filled 2013-02-12: qty 1

## 2013-02-12 MED ORDER — TRIAMCINOLONE ACETONIDE 40 MG/ML IJ SUSP
40.0000 mg | Freq: Once | INTRAMUSCULAR | Status: AC
  Administered 2013-02-12: 40 mg via INTRAMUSCULAR

## 2013-02-12 MED ORDER — IPRATROPIUM-ALBUTEROL 0.5-2.5 (3) MG/3ML IN SOLN
RESPIRATORY_TRACT | Status: AC
  Filled 2013-02-12: qty 3

## 2013-02-12 MED ORDER — IPRATROPIUM-ALBUTEROL 0.5-2.5 (3) MG/3ML IN SOLN
3.0000 mL | Freq: Once | RESPIRATORY_TRACT | Status: AC
  Administered 2013-02-12: 3 mL via RESPIRATORY_TRACT

## 2013-02-12 MED ORDER — AZITHROMYCIN 250 MG PO TABS: 250.0000 mg | ORAL_TABLET | Freq: Every day | ORAL | Status: DC

## 2013-02-12 MED ORDER — ALBUTEROL SULFATE (2.5 MG/3ML) 0.083% IN NEBU
2.5000 mg | INHALATION_SOLUTION | Freq: Once | RESPIRATORY_TRACT | Status: AC
  Administered 2013-02-12: 2.5 mg via RESPIRATORY_TRACT

## 2013-02-12 MED ORDER — ALBUTEROL SULFATE (2.5 MG/3ML) 0.083% IN NEBU
INHALATION_SOLUTION | RESPIRATORY_TRACT | Status: AC
  Filled 2013-02-12: qty 3

## 2013-02-12 MED ORDER — CHLORPHENIRAMINE MALEATE 4 MG PO TABS: ORAL_TABLET | ORAL | Status: DC

## 2013-02-12 NOTE — ED Notes (Signed)
Unable to discharge from system-registration in chart

## 2013-02-12 NOTE — Discharge Instructions (Signed)
Asthma, Acute Bronchospasm °Acute bronchospasm caused by asthma is also referred to as an asthma attack. Bronchospasm means your air passages become narrowed. The narrowing is caused by inflammation and tightening of the muscles in the air tubes (bronchi) in your lungs. This can make it hard to breath or cause you to wheeze and cough. °CAUSES °Possible triggers are: °· Animal dander from the skin, hair, or feathers of animals. °· Dust mites contained in house dust. °· Cockroaches. °· Pollen from trees or grass. °· Mold. °· Cigarette or tobacco smoke. °· Air pollutants such as dust, household cleaners, hair sprays, aerosol sprays, paint fumes, strong chemicals, or strong odors. °· Cold air or weather changes. Cold air may trigger inflammation. Winds increase molds and pollens in the air. °· Strong emotions such as crying or laughing hard. °· Stress. °· Certain medicines such as aspirin or beta-blockers. °· Sulfites in foods and drinks, such as dried fruits and wine. °· Infections or inflammatory conditions, such as a flu, cold, or inflammation of the nasal membranes (rhinitis). °· Gastroesophageal reflux disease (GERD). GERD is a condition where stomach acid backs up into your throat (esophagus). °· Exercise or strenuous activity. °SIGNS AND SYMPTOMS  °· Wheezing. °· Excessive coughing, particularly at night. °· Chest tightness. °· Shortness of breath. °DIAGNOSIS  °Your health care provider will ask you about your medical history and perform a physical exam. A chest X-ray or blood testing may be performed to look for other causes of your symptoms or other conditions that may have triggered your asthma attack.  °TREATMENT  °Treatment is aimed at reducing inflammation and opening up the airways in your lungs.  Most asthma attacks are treated with inhaled medicines. These include quick relief or rescue medicines (such as bronchodilators) and controller medicines (such as inhaled corticosteroids). These medicines are  sometimes given through an inhaler or a nebulizer. Systemic steroid medicine taken by mouth or given through an IV tube also can be used to reduce the inflammation when an attack is moderate or severe. Antibiotic medicines are only used if a bacterial infection is present.  °HOME CARE INSTRUCTIONS  °· Rest. °· Drink plenty of liquids. This helps the mucus to remain thin and be easily coughed up. Only use caffeine in moderation and do not use alcohol until you have recovered from your illness. °· Do not smoke. Avoid being exposed to secondhand smoke. °· You play a critical role in keeping yourself in good health. Avoid exposure to things that cause you to wheeze or to have breathing problems. °· Keep your medicines up to date and available. Carefully follow your health care provider's treatment plan. °· Take your medicine exactly as prescribed. °· When pollen or pollution is bad, keep windows closed and use an air conditioner or go to places with air conditioning. °· Asthma requires careful medical care. See your health care provider for a follow-up as advised. If you are more than [redacted] weeks pregnant and you were prescribed any new medicines, let your obstetrician know about the visit and how you are doing. Follow-up with your health care provider as directed. °· After you have recovered from your asthma attack, make an appointment with your outpatient doctor to talk about ways to reduce the likelihood of future attacks. If you do not have a doctor who manages your asthma, make an appointment with a primary care doctor to discuss your asthma. °SEEK IMMEDIATE MEDICAL CARE IF:  °· You are getting worse. °· You have trouble breathing. If severe, call   your local emergency services (911 in the [place]  You develop chest pain or discomfort.  You are vomiting.  You are not able to keep fluids down.  You are coughing up yellow, green, brown, or bloody sputum.  You have a fever and your symptoms suddenly get  worse.  You have trouble swallowing. MAKE SURE YOU:   Understand these instructions.  Will watch your condition.  Will get help right away if you are not doing well or get worse. Document Released: 05/15/2006 Document Revised: 09/30/2012 Document Reviewed: 08/05/2012 [facility] Patient Information 2014 [place].  Hypertension As your heart beats, it forces blood through your arteries. This force is your blood pressure. If the pressure is too high, it is called hypertension (HTN) or high blood pressure. HTN is dangerous because you may have it and not know it. High blood pressure may mean that your heart has to work harder to pump blood. Your arteries may be narrow or stiff. The extra work puts you at risk for heart disease, stroke, and other problems.  Blood pressure consists of two numbers, a higher number over a lower, 110/72, for example. It is stated as "110 over 72." The ideal is below 120 for the top number (systolic) and under 80 for the bottom (diastolic). Write down your blood pressure today. You should pay close attention to your blood pressure if you have certain conditions such as:  Heart failure.  Prior heart attack.  Diabetes  Chronic kidney disease.  Prior stroke.  Multiple risk factors for heart disease. To see if you have HTN, your blood pressure should be measured while you are seated with your arm held at the level of the heart. It should be measured at least twice. A one-time elevated blood pressure reading (especially in the Emergency Department) does not mean that you need treatment. There may be conditions in which the blood pressure is different between your right and left arms. It is important to see your caregiver soon for a recheck. Most people have essential hypertension which means that there is not a specific cause. This type of high blood pressure may be lowered by changing lifestyle factors such as:  Stress.  Smoking.  Lack of  exercise.  Excessive weight.  Drug/tobacco/alcohol use.  Eating less salt. Most people do not have symptoms from high blood pressure until it has caused damage to the body. Effective treatment can often prevent, delay or reduce that damage. TREATMENT  When a cause has been identified, treatment for high blood pressure is directed at the cause. There are a large number of medications to treat HTN. These fall into several categories, and your caregiver will help you select the medicines that are best for you. Medications may have side effects. You should review side effects with your caregiver. If your blood pressure stays high after you have made lifestyle changes or started on medicines,   Your medication(s) may need to be changed.  Other problems may need to be addressed.  Be certain you understand your prescriptions, and know how and when to take your medicine.  Be sure to follow up with your caregiver within the time frame advised (usually within two weeks) to have your blood pressure rechecked and to review your medications.  If you are taking more than one medicine to lower your blood pressure, make sure you know how and at what times they should be taken. Taking two medicines at the same time can result in blood pressure that is too  low. SEEK IMMEDIATE MEDICAL CARE IF:  You develop a severe headache, blurred or changing vision, or confusion.  You have unusual weakness or numbness, or a faint feeling.  You have severe chest or abdominal pain, vomiting, or breathing problems. MAKE SURE YOU:   Understand these instructions.  Will watch your condition.  Will get help right away if you are not doing well or get worse. Document Released: 01/28/2005 Document Revised: 04/22/2011 Document Reviewed: 09/18/2007 [facility] Patient Information 2014 [place], [place].  Upper Respiratory Infection, Adult An upper respiratory infection (URI) is also sometimes known as the common cold. The  upper respiratory tract includes the nose, sinuses, throat, trachea, and bronchi. Bronchi are the airways leading to the lungs. Most people improve within 1 week, but symptoms can last up to 2 weeks. A residual cough may last even longer.  CAUSES Many different viruses can infect the tissues lining the upper respiratory tract. The tissues become irritated and inflamed and often become very moist. Mucus production is also common. A cold is contagious. You can easily spread the virus to others by oral contact. This includes kissing, sharing a glass, coughing, or sneezing. Touching your mouth or nose and then touching a surface, which is then touched by another person, can also spread the virus. SYMPTOMS  Symptoms typically develop 1 to 3 days after you come in contact with a cold virus. Symptoms vary from person to person. They may include:  Runny nose.  Sneezing.  Nasal congestion.  Sinus irritation.  Sore throat.  Loss of voice (laryngitis).  Cough.  Fatigue.  Muscle aches.  Loss of appetite.  Headache.  Low-grade fever. DIAGNOSIS  You might diagnose your own cold based on familiar symptoms, since most people get a cold 2 to 3 times a year. Your caregiver can confirm this based on your exam. Most importantly, your caregiver can check that your symptoms are not due to another disease such as strep throat, sinusitis, pneumonia, asthma, or epiglottitis. Blood tests, throat tests, and X-rays are not necessary to diagnose a common cold, but they may sometimes be helpful in excluding other more serious diseases. Your caregiver will decide if any further tests are required. RISKS AND COMPLICATIONS  You may be at risk for a more severe case of the common cold if you smoke cigarettes, have chronic heart disease (such as heart failure) or lung disease (such as asthma), or if you have a weakened immune system. The very young and very old are also at risk for more serious infections. Bacterial  sinusitis, middle ear infections, and bacterial pneumonia can complicate the common cold. The common cold can worsen asthma and chronic obstructive pulmonary disease (COPD). Sometimes, these complications can require emergency medical care and may be life-threatening. PREVENTION  The best way to protect against getting a cold is to practice good hygiene. Avoid oral or hand contact with people with cold symptoms. Wash your hands often if contact occurs. There is no clear evidence that vitamin C, vitamin E, echinacea, or exercise reduces the chance of developing a cold. However, it is always recommended to get plenty of rest and practice good nutrition. TREATMENT  Treatment is directed at relieving symptoms. There is no cure. Antibiotics are not effective, because the infection is caused by a virus, not by bacteria. Treatment may include:  Increased fluid intake. Sports drinks offer valuable electrolytes, sugars, and fluids.  Breathing heated mist or steam (vaporizer or shower).  Eating chicken soup or other clear broths, and maintaining good  nutrition.  Getting plenty of rest.  Using gargles or lozenges for comfort.  Controlling fevers with ibuprofen or acetaminophen as directed by your caregiver.  Increasing usage of your inhaler if you have asthma. Zinc gel and zinc lozenges, taken in the first 24 hours of the common cold, can shorten the duration and lessen the severity of symptoms. Pain medicines may help with fever, muscle aches, and throat pain. A variety of non-prescription medicines are available to treat congestion and runny nose. Your caregiver can make recommendations and may suggest nasal or lung inhalers for other symptoms.  HOME CARE INSTRUCTIONS   Only take over-the-counter or prescription medicines for pain, discomfort, or fever as directed by your caregiver.  Use a warm mist humidifier or inhale steam from a shower to increase air moisture. This may keep secretions moist and  make it easier to breathe.  Drink enough water and fluids to keep your urine clear or pale yellow.  Rest as needed.  Return to work when your temperature has returned to normal or as your caregiver advises. You may need to stay home longer to avoid infecting others. You can also use a face mask and careful hand washing to prevent spread of the virus. SEEK MEDICAL CARE IF:   After the first few days, you feel you are getting worse rather than better.  You need your caregiver's advice about medicines to control symptoms.  You develop chills, worsening shortness of breath, or brown or red sputum. These may be signs of pneumonia.  You develop yellow or brown nasal discharge or pain in the face, especially when you bend forward. These may be signs of sinusitis.  You develop a fever, swollen neck glands, pain with swallowing, or white areas in the back of your throat. These may be signs of strep throat. SEEK IMMEDIATE MEDICAL CARE IF:   You have a fever.  You develop severe or persistent headache, ear pain, sinus pain, or chest pain.  You develop wheezing, a prolonged cough, cough up blood, or have a change in your usual mucus (if you have chronic lung disease).  You develop sore muscles or a stiff neck. Document Released: 07/24/2000 Document Revised: 04/22/2011 Document Reviewed: 06/01/2010 [facility] Patient Information 2014 [place], [place].

## 2013-02-12 NOTE — ED Provider Notes (Signed)
Medical screening examination/treatment/procedure(s) were performed by resident physician or non-physician practitioner and as supervising physician I was immediately available for consultation/collaboration.   Valentine Barney DOUGLAS MD.   Griselle Rufer D Lui Bellis, MD 02/12/13 1332 

## 2013-02-12 NOTE — ED Provider Notes (Signed)
CSN: [id number]     Arrival date & time 02/12/13  1014 History   None    Chief Complaint  Patient presents with  . Asthma   (Consider location/radiation/quality/duration/timing/severity/associated sxs/prior Treatment) HPI Comments: 38 year old morbidly obese female with a history of asthma presents with severe cough and trouble breathing. She used her Ventolin inhaler without improvement.   Past Medical History  Diagnosis Date  . Morbid obesity   . Hypertension   . Diabetes mellitus without complication    Past Surgical History  Procedure Laterality Date  . No past surgeries     Family History  Problem Relation Age of Onset  . Diabetes Mother    History  Substance Use Topics  . Smoking status: Never Smoker   . Smokeless tobacco: Never Used  . Alcohol Use: No   OB History   Grav Para Term Preterm Abortions TAB SAB Ect Mult Living   3 3 3  0 0 0 0 0 0 3     Review of Systems  Constitutional: Positive for activity change. Negative for fever, chills, appetite change and fatigue.  HENT: Positive for congestion, postnasal drip and rhinorrhea. Negative for facial swelling.   Eyes: Negative.   Respiratory: Positive for cough, shortness of breath and wheezing.   Cardiovascular: Negative.   Musculoskeletal: Negative for neck pain and neck stiffness.  Skin: Negative for pallor and rash.  Neurological: Negative.   Psychiatric/Behavioral: Negative.     Allergies  Review of patient's allergies indicates no known allergies.  Home Medications   Current Outpatient Rx  Name  Route  Sig  Dispense  Refill  . azithromycin (ZITHROMAX) 250 MG tablet   Oral   Take 1 tablet (250 mg total) by mouth daily. 2 tabs po on day 1, 1 tab po on days 2-5   6 tablet   0   . chlorpheniramine (CHLOR-TRIMETON) 4 MG tablet      1 tab BID prn drainage   14 tablet   0   . ibuprofen (ADVIL,MOTRIN) 600 MG tablet   Oral   Take 1 tablet (600 mg total) by mouth every 6 (six) hours as needed for  pain.   30 tablet   0   . Prenatal Vit-Fe Fumarate-FA (PRENATAL MULTIVITAMIN) TABS tablet   Oral   Take 1 tablet by mouth daily at 12 noon.          BP 209/103  Pulse 107  Temp(Src) 97.8 F (36.6 C) (Oral)  Resp 28  SpO2 97%  LMP 01/27/2013  Breastfeeding? No Physical Exam  Nursing note and vitals reviewed. Constitutional: She is oriented to person, place, and time. She appears well-developed and well-nourished. No distress.  HENT:  Mouth/Throat: Oropharynx is clear and moist. No oropharyngeal exudate.  PND  Eyes: Conjunctivae and EOM are normal.  Neck: Normal range of motion. Neck supple.  Cardiovascular: Normal rate, regular rhythm and normal heart sounds.   Pulmonary/Chest: No respiratory distress. She has wheezes.  Bilateral expiratory wheezes with prolonged expiratory phase.  Musculoskeletal: Normal range of motion. She exhibits no edema.  Lymphadenopathy:    She has no cervical adenopathy.  Neurological: She is alert and oriented to person, place, and time.  Skin: Skin is warm and dry. No rash noted.  Psychiatric: She has a normal mood and affect.    ED Course  Procedures (including critical care time) Labs Review Labs Reviewed - No data to display Imaging Review No results found.    MDM   1. Asthma  exacerbation   2. Rhinosinusitis   3. HTN (hypertension)      1050 hours: Patient states she has subjective improvement in breathing with less coughing and improved air movement. Auscultation reveals modest improvement with continuous and significant expiratory wheezing. Repeat albuterol neb 20 minutes after completing the first one. 1125 hours: After the second med there is much improvement. There is wheezing present but significantly diminished. She states she feels better and is breathing better. She also has copious amount of PND which she is also spitting up.  Chlor-Trimeton 4 mg twice a day when necessary Z-Pak Use her albuterol HFA 2 puffs every 4  hours when necessary Drink plenty of fluids Followup with your PCP as needed may return if worse the symptoms or problems  Hayden Rasmussen, NP 02/12/13 1147

## 2013-02-12 NOTE — ED Notes (Signed)
Patient in gown 

## 2013-02-20 ENCOUNTER — Encounter (HOSPITAL_COMMUNITY): Payer: Self-pay | Admitting: Emergency Medicine

## 2013-02-20 ENCOUNTER — Emergency Department (HOSPITAL_COMMUNITY)
Admission: EM | Admit: 2013-02-20 | Discharge: 2013-02-20 | Discharge status: Against Medical Advice | Payer: Medicaid Other | Attending: Emergency Medicine | Admitting: Emergency Medicine

## 2013-02-20 DIAGNOSIS — I10 Essential (primary) hypertension: Secondary | ICD-10-CM | POA: Insufficient documentation

## 2013-02-20 DIAGNOSIS — J029 Acute pharyngitis, unspecified: Secondary | ICD-10-CM | POA: Insufficient documentation

## 2013-02-20 DIAGNOSIS — E119 Type 2 diabetes mellitus without complications: Secondary | ICD-10-CM | POA: Insufficient documentation

## 2013-02-20 LAB — RAPID STREP SCREEN (MED CTR MEBANE ONLY): STREPTOCOCCUS, GROUP A SCREEN (DIRECT): NEGATIVE

## 2013-02-20 NOTE — ED Notes (Signed)
Pt. reports persistent sore throat with productive cough for several weeks , pt. is hypertensive at triage - she is not taking any antihypertensive .

## 2013-02-21 ENCOUNTER — Emergency Department (HOSPITAL_COMMUNITY): Payer: Medicaid Other

## 2013-02-21 ENCOUNTER — Emergency Department (HOSPITAL_COMMUNITY)
Admission: EM | Admit: 2013-02-21 | Discharge: 2013-02-21 | Disposition: A | Discharge status: Home / Self Care | Payer: Medicaid Other | Attending: Emergency Medicine | Admitting: Emergency Medicine

## 2013-02-21 ENCOUNTER — Encounter (HOSPITAL_COMMUNITY): Payer: Self-pay | Admitting: Emergency Medicine

## 2013-02-21 DIAGNOSIS — Z9119 Patient's noncompliance with other medical treatment and regimen: Secondary | ICD-10-CM | POA: Insufficient documentation

## 2013-02-21 DIAGNOSIS — Z79899 Other long term (current) drug therapy: Secondary | ICD-10-CM | POA: Insufficient documentation

## 2013-02-21 DIAGNOSIS — E119 Type 2 diabetes mellitus without complications: Secondary | ICD-10-CM | POA: Insufficient documentation

## 2013-02-21 DIAGNOSIS — R809 Proteinuria, unspecified: Secondary | ICD-10-CM

## 2013-02-21 DIAGNOSIS — R739 Hyperglycemia, unspecified: Secondary | ICD-10-CM

## 2013-02-21 DIAGNOSIS — R9431 Abnormal electrocardiogram [ECG] [EKG]: Secondary | ICD-10-CM | POA: Insufficient documentation

## 2013-02-21 DIAGNOSIS — J069 Acute upper respiratory infection, unspecified: Secondary | ICD-10-CM

## 2013-02-21 DIAGNOSIS — Z3202 Encounter for pregnancy test, result negative: Secondary | ICD-10-CM | POA: Insufficient documentation

## 2013-02-21 DIAGNOSIS — I10 Essential (primary) hypertension: Secondary | ICD-10-CM

## 2013-02-21 DIAGNOSIS — Z881 Allergy status to other antibiotic agents status: Secondary | ICD-10-CM | POA: Insufficient documentation

## 2013-02-21 DIAGNOSIS — J45909 Unspecified asthma, uncomplicated: Secondary | ICD-10-CM

## 2013-02-21 DIAGNOSIS — Z91199 Patient's noncompliance with other medical treatment and regimen due to unspecified reason: Secondary | ICD-10-CM | POA: Insufficient documentation

## 2013-02-21 DIAGNOSIS — R000 Tachycardia, unspecified: Secondary | ICD-10-CM | POA: Insufficient documentation

## 2013-02-21 LAB — URINE MICROSCOPIC-ADD ON

## 2013-02-21 LAB — URINALYSIS, ROUTINE W REFLEX MICROSCOPIC
Bilirubin Urine: NEGATIVE
GLUCOSE, UA: 250 mg/dL — AB
Ketones, ur: 15 mg/dL — AB
LEUKOCYTES UA: NEGATIVE
Nitrite: NEGATIVE
SPECIFIC GRAVITY, URINE: 1.03 (ref 1.005–1.030)
Urobilinogen, UA: 1 mg/dL (ref 0.0–1.0)
pH: 5.5 (ref 5.0–8.0)

## 2013-02-21 LAB — COMPREHENSIVE METABOLIC PANEL
ALBUMIN: 3.1 g/dL — AB (ref 3.5–5.2)
ALK PHOS: 52 U/L (ref 39–117)
ALT: 10 U/L (ref 0–35)
AST: 14 U/L (ref 0–37)
BUN: 12 mg/dL (ref 6–23)
CALCIUM: 9.5 mg/dL (ref 8.4–10.5)
CO2: 28 mEq/L (ref 19–32)
CREATININE: 0.74 mg/dL (ref 0.50–1.10)
Chloride: 95 mEq/L — ABNORMAL LOW (ref 96–112)
GFR calc non Af Amer: >90 mL/min (ref >90)
GLUCOSE: 276 mg/dL — AB (ref 70–99)
Potassium: 4.6 mEq/L (ref 3.7–5.3)
Sodium: 133 mEq/L — ABNORMAL LOW (ref 137–147)
TOTAL PROTEIN: 7.7 g/dL (ref 6.0–8.3)
Total Bilirubin: 0.2 mg/dL — ABNORMAL LOW (ref 0.3–1.2)

## 2013-02-21 LAB — CBC WITH DIFFERENTIAL/PLATELET
BASOS PCT: 1 % (ref 0–1)
Basophils Absolute: 0 10*3/uL (ref 0.0–0.1)
EOS ABS: 0.2 10*3/uL (ref 0.0–0.7)
EOS PCT: 3 % (ref 0–5)
HCT: 43.9 % (ref 36.0–46.0)
HEMOGLOBIN: 14.9 g/dL (ref 12.0–15.0)
LYMPHS ABS: 2.8 10*3/uL (ref 0.7–4.0)
Lymphocytes Relative: 42 % (ref 12–46)
MCH: 27.6 pg (ref 26.0–34.0)
MCHC: 33.9 g/dL (ref 30.0–36.0)
MCV: 81.4 fL (ref 78.0–100.0)
MONO ABS: 0.4 10*3/uL (ref 0.1–1.0)
MONOS PCT: 7 % (ref 3–12)
Neutro Abs: 3.2 10*3/uL (ref 1.7–7.7)
Neutrophils Relative %: 48 % (ref 43–77)
Platelets: 188 10*3/uL (ref 150–400)
RBC: 5.39 MIL/uL — AB (ref 3.87–5.11)
RDW: 13.6 % (ref 11.5–15.5)
WBC: 6.7 10*3/uL (ref 4.0–10.5)

## 2013-02-21 LAB — POCT I-STAT, CHEM 8
BUN: 14 mg/dL (ref 6–23)
CHLORIDE: 100 meq/L (ref 96–112)
Calcium, Ion: 1.19 mmol/L (ref 1.12–1.23)
Creatinine, Ser: 0.9 mg/dL (ref 0.50–1.10)
Glucose, Bld: 282 mg/dL — ABNORMAL HIGH (ref 70–99)
HCT: 48 % — ABNORMAL HIGH (ref 36.0–46.0)
Hemoglobin: 16.3 g/dL — ABNORMAL HIGH (ref 12.0–15.0)
POTASSIUM: 4.8 meq/L (ref 3.7–5.3)
SODIUM: 136 meq/L — AB (ref 137–147)
TCO2: 28 mmol/L (ref 0–100)

## 2013-02-21 LAB — GLUCOSE, CAPILLARY
GLUCOSE-CAPILLARY: 267 mg/dL — AB (ref 70–99)
Glucose-Capillary: 254 mg/dL — ABNORMAL HIGH (ref 70–99)

## 2013-02-21 LAB — POCT I-STAT 3, ART BLOOD GAS (G3+)
Acid-Base Excess: 2 mmol/L (ref 0.0–2.0)
BICARBONATE: 24.5 meq/L — AB (ref 20.0–24.0)
O2 SAT: 94 %
PCO2 ART: 32.5 mmHg — AB (ref 35.0–45.0)
PO2 ART: 63 mmHg — AB (ref 80.0–100.0)
TCO2: 25 mmol/L (ref 0–100)
pH, Arterial: 7.486 — ABNORMAL HIGH (ref 7.350–7.450)

## 2013-02-21 LAB — POCT PREGNANCY, URINE: Preg Test, Ur: NEGATIVE

## 2013-02-21 MED ORDER — HYDRALAZINE HCL 20 MG/ML IJ SOLN
10.0000 mg | INTRAMUSCULAR | Status: AC
  Administered 2013-02-21: 10 mg via INTRAVENOUS
  Filled 2013-02-21: qty 1

## 2013-02-21 MED ORDER — ALBUTEROL SULFATE (2.5 MG/3ML) 0.083% IN NEBU
5.0000 mg | INHALATION_SOLUTION | Freq: Once | RESPIRATORY_TRACT | Status: AC
  Administered 2013-02-21: 5 mg via RESPIRATORY_TRACT
  Filled 2013-02-21: qty 6

## 2013-02-21 MED ORDER — ALBUTEROL SULFATE HFA 108 (90 BASE) MCG/ACT IN AERS: 2.0000 | INHALATION_SPRAY | RESPIRATORY_TRACT | Status: DC | PRN

## 2013-02-21 MED ORDER — SODIUM CHLORIDE 0.9 % IV BOLUS (SEPSIS)
500.0000 mL | Freq: Once | INTRAVENOUS | Status: AC
  Administered 2013-02-21: 500 mL via INTRAVENOUS

## 2013-02-21 MED ORDER — TRIAMTERENE-HCTZ 75-50 MG PO TABS: 1.0000 | ORAL_TABLET | Freq: Every day | ORAL | Status: DC

## 2013-02-21 MED ORDER — METFORMIN HCL 500 MG PO TABS: 500.0000 mg | ORAL_TABLET | Freq: Two times a day (BID) | ORAL | Status: DC

## 2013-02-21 NOTE — ED Notes (Signed)
Patient returned from X-ray 

## 2013-02-21 NOTE — ED Provider Notes (Signed)
CSN: [id number]     Arrival date & time 02/21/13  1402 History   First MD Initiated Contact with Patient 02/21/13 1615     Chief Complaint  Patient presents with  . Sore Throat  . Hypertension  . Cough   (Consider location/radiation/quality/duration/timing/severity/associated sxs/prior Treatment) HPI This is a 38 year old female with a past medical history of morbid obesity, hypertension, diabetes, asthma.  Patient has been noncompliant with her medications for the past 3 weeks as she states her primary care doctor's office shutdown.  Patient has also had a URI which began one month ago.  She has had chronic asthmatic symptoms since that time.  Patient has been using Alka-Seltzer cold and flu for relief of her symptoms.  She was [facility] on 02-2013 and treated for asthma exacerbation.  The patient comes in today as her blood pressure has been running very high.  She is complaining of cough, sore throat, wheezing. Denies unilateral weakness, facial asymmetry, difficulty with speech, change in gait, or vertigo. Denies fevers, chills, myalgias, arthralgias. Denies DOE, SOB, chest tightness or pressure, radiation to left arm, jaw or back, or diaphoresis. Denies dysuria, flank pain, suprapubic pain, frequency, urgency, or hematuria. Denies headaches, light headedness, weakness, visual disturbances. Denies abdominal pain, nausea, vomiting, diarrhea or constipation.    Past Medical History  Diagnosis Date  . Morbid obesity   . Hypertension   . Diabetes mellitus without complication    Past Surgical History  Procedure Laterality Date  . No past surgeries     Family History  Problem Relation Age of Onset  . Diabetes Mother    History  Substance Use Topics  . Smoking status: Never Smoker   . Smokeless tobacco: Never Used  . Alcohol Use: No   OB History   Grav Para Term Preterm Abortions TAB SAB Ect Mult Living   3 3 3  0 0 0 0 0 0 3     Review of Systems Ten  systems reviewed and are negative for acute change, except as noted in the HPI.   Allergies  Azithromycin  Home Medications  No current outpatient prescriptions on file. BP 149/109  Pulse 113  Temp(Src) 98.6 F (37 C) (Oral)  Resp 18  Wt 296 lb 3.2 oz (134.355 kg)  SpO2 100%  LMP 02/14/2013  Breastfeeding? No Physical Exam Physical Exam  Nursing note and vitals reviewed. Constitutional: She is oriented to person, place, and time. She appears well-developed and well-nourished. No distress.  HENT:  Head: Normocephalic and atraumatic.  Eyes: Conjunctivae normal. Left eye exotropia present Pupils are equal, round, and reactive to light. No scleral icterus.  Neck: Normal range of motion.  Cardiovascular: Normal rate, regular rhythm and normal heart sounds.  Exam reveals no gallop and no friction rub.   No murmur heard. Pulmonary/Chest: Effort, bronchitic wheeze audible from the doorway.   Abdominal: Soft. Bowel sounds are normal. She exhibits no distension and no mass. There is no tenderness. There is no guarding.  Neurological: She is alert and oriented to person, place, and time.  Skin: Skin is warm and dry. She is not diaphoretic.    ED Course  Procedures (including critical care time) Labs Review Labs Reviewed  GLUCOSE, CAPILLARY - Abnormal; Notable for the following:    Glucose-Capillary 254 (*)    All other components within normal limits  CBC WITH DIFFERENTIAL - Abnormal; Notable for the following:    RBC 5.39 (*)    All other  components within normal limits  COMPREHENSIVE METABOLIC PANEL - Abnormal; Notable for the following:    Sodium 133 (*)    Chloride 95 (*)    Glucose, Bld 276 (*)    Albumin 3.1 (*)    Total Bilirubin 0.2 (*)    All other components within normal limits  POCT I-STAT, CHEM 8 - Abnormal; Notable for the following:    Sodium 136 (*)    Glucose, Bld 282 (*)    Hemoglobin 16.3 (*)    HCT 48.0 (*)    All other components within normal limits   URINALYSIS, ROUTINE W REFLEX MICROSCOPIC  POCT PREGNANCY, URINE   Imaging Review No results found.  EKG Interpretation   None       MDM   1. URI (upper respiratory infection)   2. RAD (reactive airway disease)   3. HTN (hypertension)   4. Hyperglycemia   5. Proteinuria    6:02 PM pateint with multiple comorbidities.  She had an initial blood glucose of 282.  She does appear to have an anion gap of 22.  Positive for ketones and protein in her urine.  Creatinine is normal.   7:20 PM Blood pressure has decreased. Patient has a metabolic and respiratory alkalosis.  +urine ketones, however thsi does not appear to be DKA.  Patient will be discharged with her normal home meds. And albuterol. Hypertensive. No signs of acute  End organ damage. Follow closely with [facility].   Arthor Captainbigail Yamilka Lopiccolo, PA-C 02/24/13 1126

## 2013-02-21 NOTE — ED Notes (Addendum)
Pt c/o productive cough x 1 month, sore throat onset 3 days ago. Pt seen here yesterday for same but left after triage. Pt continues to have high blood pressure. Pt reports ran out of BP medication a couple days ago. Pt does not have a PMD to refill her prescriptions. Pt denies headache, dizziness or nausea/vomiting. Pt also running out of her medication for diabetes.

## 2013-02-21 NOTE — ED Notes (Signed)
Spoke with pt about her BP and encouraged her to stay for further eval and treatment. Stressed importance of controlling BP.

## 2013-02-21 NOTE — Discharge Instructions (Signed)
PLEASE TAKE YOUR MEDICATION DAILY AS DIRECTED.' PLEASE FOLLOW UP WITH OUR [facility].   Read the instructions below on reasons to return to the emergency department and to learn more about your diagnosis.  Use over the counter medications for symptomatic relief as we discussed (musinex as a decongestant, Tylenol for fever/pain, Motrin/Ibuprofen for muscle aches). If prescribed a cough suppressant during your visit, do not operate heavy machinery with in 5 hours of taking this medication. Followup with your primary care doctor in 4 days if your symptoms persist.  Your more than welcome to return to the emergency department if symptoms worsen or become concerning.  Upper Respiratory Infection, Adult  An upper respiratory infection (URI) is also sometimes known as the common cold. Most people improve within 1 week, but symptoms can last up to 2 weeks. A residual cough may last even longer.   URI is most commonly caused by a virus. Viruses are NOT treated with antibiotics. You can easily spread the virus to others by oral contact. This includes kissing, sharing a glass, coughing, or sneezing. Touching your mouth or nose and then touching a surface, which is then touched by another person, can also spread the virus.   TREATMENT  Treatment is directed at relieving symptoms. There is no cure. Antibiotics are not effective, because the infection is caused by a virus, not by bacteria. Treatment may include:  Increased fluid intake. Sports drinks offer valuable electrolytes, sugars, and fluids.  Breathing heated mist or steam (vaporizer or shower).  Eating chicken soup or other clear broths, and maintaining good nutrition.  Getting plenty of rest.  Using gargles or lozenges for comfort.  Controlling fevers with ibuprofen or acetaminophen as directed by your caregiver.  Increasing usage of your inhaler if you have asthma.  Return to work when your temperature has returned to normal.    SEEK MEDICAL CARE IF:  After the first few days, you feel you are getting worse rather than better.  You develop worsening shortness of breath, or brown or red sputum. These may be signs of pneumonia.  You develop yellow or brown nasal discharge or pain in the face, especially when you bend forward. These may be signs of sinusitis.  You develop a fever, swollen neck glands, pain with swallowing, or white areas in the back of your throat. These may be signs of strep throat.

## 2013-02-22 LAB — CULTURE, GROUP A STREP

## 2013-02-24 NOTE — ED Provider Notes (Signed)
Medical screening examination/treatment/procedure(s) were performed by non-physician practitioner and as supervising physician I was immediately available for consultation/collaboration.  EKG Interpretation    Date/Time:  Sunday February 21 2013 14:25:54 EST Ventricular Rate:  107 PR Interval:  164 QRS Duration: 88 QT Interval:  340 QTC Calculation: 453 R Axis:   42 Text Interpretation:  Sinus tachycardia Cannot rule out Anterior infarct , age undetermined Abnormal ECG ED PHYSICIAN INTERPRETATION AVAILABLE IN [facility] Confirmed by TEST, RECORD ([id number]) on 02/23/2013 9:31:53 AM              Darlys Galesavid Latanya Hemmer, MD 02/24/13 1725

## 2013-06-08 ENCOUNTER — Emergency Department (HOSPITAL_COMMUNITY): Payer: Medicaid Other

## 2013-06-08 ENCOUNTER — Encounter (HOSPITAL_COMMUNITY): Payer: Self-pay | Admitting: Emergency Medicine

## 2013-06-08 ENCOUNTER — Emergency Department (HOSPITAL_COMMUNITY)
Admission: EM | Admit: 2013-06-08 | Discharge: 2013-06-08 | Disposition: A | Discharge status: Home / Self Care | Payer: Medicaid Other | Attending: Emergency Medicine | Admitting: Emergency Medicine

## 2013-06-08 DIAGNOSIS — Y9389 Activity, other specified: Secondary | ICD-10-CM | POA: Insufficient documentation

## 2013-06-08 DIAGNOSIS — E119 Type 2 diabetes mellitus without complications: Secondary | ICD-10-CM | POA: Insufficient documentation

## 2013-06-08 DIAGNOSIS — Y929 Unspecified place or not applicable: Secondary | ICD-10-CM | POA: Insufficient documentation

## 2013-06-08 DIAGNOSIS — X500XXA Overexertion from strenuous movement or load, initial encounter: Secondary | ICD-10-CM | POA: Insufficient documentation

## 2013-06-08 DIAGNOSIS — I10 Essential (primary) hypertension: Secondary | ICD-10-CM | POA: Insufficient documentation

## 2013-06-08 DIAGNOSIS — S93409A Sprain of unspecified ligament of unspecified ankle, initial encounter: Secondary | ICD-10-CM

## 2013-06-08 DIAGNOSIS — Z79899 Other long term (current) drug therapy: Secondary | ICD-10-CM | POA: Insufficient documentation

## 2013-06-08 MED ORDER — HYDROCODONE-ACETAMINOPHEN 5-325 MG PO TABS: 1.0000 | ORAL_TABLET | ORAL | Status: DC | PRN

## 2013-06-08 MED ORDER — OXYCODONE-ACETAMINOPHEN 5-325 MG PO TABS
2.0000 | ORAL_TABLET | Freq: Once | ORAL | Status: AC
  Administered 2013-06-08: 2 via ORAL
  Filled 2013-06-08: qty 2

## 2013-06-08 MED ORDER — ONDANSETRON 4 MG PO TBDP
4.0000 mg | ORAL_TABLET | Freq: Once | ORAL | Status: AC
  Administered 2013-06-08: 4 mg via ORAL
  Filled 2013-06-08: qty 1

## 2013-06-08 NOTE — ED Notes (Addendum)
Pt verbalized understanding of discharge instructions.  Pt ambulated to the waiting room with a steady gait using crutches and Cam boot.    PA made aware of pt blood pressure.  Blood pressure was addressed in the discharge instructions and pt need for compliance with BP medications.

## 2013-06-08 NOTE — ED Notes (Signed)
Ortho paged and responded for Cam Walker and crutches.

## 2013-06-08 NOTE — ED Notes (Signed)
[facility] informed of orders.

## 2013-06-08 NOTE — ED Notes (Signed)
BP addressed with PA and MD.

## 2013-06-08 NOTE — ED Provider Notes (Signed)
Medical screening examination/treatment/procedure(s) were performed by non-physician practitioner and as supervising physician I was immediately available for consultation/collaboration.   EKG Interpretation None        Gavin PoundMichael Y. Sanjit Mcmichael, MD 06/08/13 1150

## 2013-06-08 NOTE — Discharge Instructions (Signed)
Ankle Sprain °An ankle sprain is an injury to the strong, fibrous tissues (ligaments) that hold the bones of your ankle joint together.  °CAUSES °An ankle sprain is usually caused by a fall or by twisting your ankle. Ankle sprains most commonly occur when you step on the outer edge of your foot, and your ankle turns inward. People who participate in sports are more prone to these types of injuries.  °SYMPTOMS  °· Pain in your ankle. The pain may be present at rest or only when you are trying to stand or walk. °· Swelling. °· Bruising. Bruising may develop immediately or within 1 to 2 days after your injury. °· Difficulty standing or walking, particularly when turning corners or changing directions. °DIAGNOSIS  °Your caregiver will ask you details about your injury and perform a physical exam of your ankle to determine if you have an ankle sprain. During the physical exam, your caregiver will press on and apply pressure to specific areas of your foot and ankle. Your caregiver will try to move your ankle in certain ways. An X-ray exam may be done to be sure a bone was not broken or a ligament did not separate from one of the bones in your ankle (avulsion fracture).  °TREATMENT  °Certain types of braces can help stabilize your ankle. Your caregiver can make a recommendation for this. Your caregiver may recommend the use of medicine for pain. If your sprain is severe, your caregiver may refer you to a surgeon who helps to restore function to parts of your skeletal system (orthopedist) or a physical therapist. °HOME CARE INSTRUCTIONS  °· Apply ice to your injury for 1 2 days or as directed by your caregiver. Applying ice helps to reduce inflammation and pain. °· Put ice in a plastic bag. °· Place a towel between your skin and the bag. °· Leave the ice on for 15-20 minutes at a time, every 2 hours while you are awake. °· Only take over-the-counter or prescription medicines for pain, discomfort, or fever as directed by  your caregiver. °· Elevate your injured ankle above the level of your heart as much as possible for 2 3 days. °· If your caregiver recommends crutches, use them as instructed. Gradually put weight on the affected ankle. Continue to use crutches or a cane until you can walk without feeling pain in your ankle. °· If you have a plaster splint, wear the splint as directed by your caregiver. Do not rest it on anything harder than a pillow for the first 24 hours. Do not put weight on it. Do not get it wet. You may take it off to take a shower or bath. °· You may have been given an elastic bandage to wear around your ankle to provide support. If the elastic bandage is too tight (you have numbness or tingling in your foot or your foot becomes cold and blue), adjust the bandage to make it comfortable. °· If you have an air splint, you may blow more air into it or let air out to make it more comfortable. You may take your splint off at night and before taking a shower or bath. Wiggle your toes in the splint several times per day to decrease swelling. °SEEK MEDICAL CARE IF:  °· You have rapidly increasing bruising or swelling. °· Your toes feel extremely cold or you lose feeling in your foot. °· Your pain is not relieved with medicine. °SEEK IMMEDIATE MEDICAL CARE IF: °· Your toes are numb   or blue.  You have severe pain that is increasing. MAKE SURE YOU:   Understand these instructions.  Will watch your condition.  Will get help right away if you are not doing well or get worse. Document Released: 01/28/2005 Document Revised: 10/23/2011 Document Reviewed: 02/09/2011 [facility] Patient Information 2014 [place], [place].  Hypertension As your heart beats, it forces blood through your arteries. This force is your blood pressure. If the pressure is too high, it is called hypertension (HTN) or high blood pressure. HTN is dangerous because you may have it and not know it. High blood pressure may mean that your heart has  to work harder to pump blood. Your arteries may be narrow or stiff. The extra work puts you at risk for heart disease, stroke, and other problems.  Blood pressure consists of two numbers, a higher number over a lower, 110/72, for example. It is stated as "110 over 72." The ideal is below 120 for the top number (systolic) and under 80 for the bottom (diastolic). Write down your blood pressure today. You should pay close attention to your blood pressure if you have certain conditions such as:  Heart failure.  Prior heart attack.  Diabetes  Chronic kidney disease.  Prior stroke.  Multiple risk factors for heart disease. To see if you have HTN, your blood pressure should be measured while you are seated with your arm held at the level of the heart. It should be measured at least twice. A one-time elevated blood pressure reading (especially in the Emergency Department) does not mean that you need treatment. There may be conditions in which the blood pressure is different between your right and left arms. It is important to see your caregiver soon for a recheck. Most people have essential hypertension which means that there is not a specific cause. This type of high blood pressure may be lowered by changing lifestyle factors such as:  Stress.  Smoking.  Lack of exercise.  Excessive weight.  Drug/tobacco/alcohol use.  Eating less salt. Most people do not have symptoms from high blood pressure until it has caused damage to the body. Effective treatment can often prevent, delay or reduce that damage. TREATMENT  When a cause has been identified, treatment for high blood pressure is directed at the cause. There are a large number of medications to treat HTN. These fall into several categories, and your caregiver will help you select the medicines that are best for you. Medications may have side effects. You should review side effects with your caregiver. If your blood pressure stays high after  you have made lifestyle changes or started on medicines,   Your medication(s) may need to be changed.  Other problems may need to be addressed.  Be certain you understand your prescriptions, and know how and when to take your medicine.  Be sure to follow up with your caregiver within the time frame advised (usually within two weeks) to have your blood pressure rechecked and to review your medications.  If you are taking more than one medicine to lower your blood pressure, make sure you know how and at what times they should be taken. Taking two medicines at the same time can result in blood pressure that is too low. SEEK IMMEDIATE MEDICAL CARE IF:  You develop a severe headache, blurred or changing vision, or confusion.  You have unusual weakness or numbness, or a faint feeling.  You have severe chest or abdominal pain, vomiting, or breathing problems. MAKE SURE YOU:  Understand these instructions.  Will watch your condition.  Will get help right away if you are not doing well or get worse. Document Released: 01/28/2005 Document Revised: 04/22/2011 Document Reviewed: 09/18/2007 [facility] Patient Information 2014 [place], [place]. Acetaminophen; Hydrocodone tablets or capsules What is this medicine? ACETAMINOPHEN; HYDROCODONE (a set a MEE noe fen; hye droe KOE done) is a pain reliever. It is used to treat mild to moderate pain. This medicine may be used for other purposes; ask your health care provider or pharmacist if you have questions. COMMON BRAND NAME(S): Anexsia, Bancap HC , Ceta-Plus, Co-Gesic, Comfortpak , Dolagesic, [place], [address]/[employer] , [address] , Hydrogesic, Lorcet HD, Lorcet Plus, Lorcet, [place], Margesic H, Maxidone, [place], Polygesic, [place], [place], Vicodin ES, Vicodin HP, Vicodin, [place] Bas[place] What should I tell my health care provider before I take this medicine? They need to know if you have any of these conditions: -brain tumor -Crohn's disease, inflammatory  bowel disease, or ulcerative colitis -drug abuse or addiction -head injury -heart or circulation problems -if you often drink alcohol -kidney disease or problems going to the bathroom -liver disease -lung disease, asthma, or breathing problems -an unusual or allergic reaction to acetaminophen, hydrocodone, other opioid analgesics, other medicines, foods, dyes, or preservatives -pregnant or trying to get pregnant -breast-feeding How should I use this medicine? Take this medicine by mouth. Swallow it with a full glass of water. Follow the directions on the prescription label. If the medicine upsets your stomach, take the medicine with food or milk. Do not take more than you are told to take. Talk to your pediatrician regarding the use of this medicine in children. This medicine is not approved for use in children. Overdosage: If you think you have taken too much of this medicine contact a poison control center or emergency room at once. NOTE: This medicine is only for you. Do not share this medicine with others. What if I miss a dose? If you miss a dose, take it as soon as you can. If it is almost time for your next dose, take only that dose. Do not take double or extra doses. What may interact with this medicine? -alcohol -antihistamines -isoniazid -medicines for depression, anxiety, or psychotic disturbances -medicines for sleep -muscle relaxants -naltrexone -narcotic medicines (opiates) for pain -phenobarbital -ritonavir -tramadol This list may not describe all possible interactions. Give your health care provider a list of all the medicines, herbs, non-prescription drugs, or dietary supplements you use. Also tell them if you smoke, drink alcohol, or use illegal drugs. Some items may interact with your medicine. What should I watch for while using this medicine? Tell your doctor or health care professional if your pain does not go away, if it gets worse, or if you have new or a  different type of pain. You may develop tolerance to the medicine. Tolerance means that you will need a higher dose of the medicine for pain relief. Tolerance is normal and is expected if you take the medicine for a long time. Do not suddenly stop taking your medicine because you may develop a severe reaction. Your body becomes used to the medicine. This does NOT mean you are addicted. Addiction is a behavior related to getting and using a drug for a non-medical reason. If you have pain, you have a medical reason to take pain medicine. Your doctor will tell you how much medicine to take. If your doctor wants you to stop the medicine, the dose will be slowly lowered over time to  avoid any side effects. You may get drowsy or dizzy when you first start taking the medicine or change doses. Do not drive, use machinery, or do anything that may be dangerous until you know how the medicine affects you. Stand or sit up slowly. There are different types of narcotic medicines (opiates) for pain. If you take more than one type at the same time, you may have more side effects. Give your health care provider a list of all medicines you use. Your doctor will tell you how much medicine to take. Do not take more medicine than directed. Call emergency for help if you have problems breathing. The medicine will cause constipation. Try to have a bowel movement at least every 2 to 3 days. If you do not have a bowel movement for 3 days, call your doctor or health care professional. Too much acetaminophen can be very dangerous. Do not take Tylenol (acetaminophen) or medicines that contain acetaminophen with this medicine. Many non-prescription medicines contain acetaminophen. Always read the labels carefully. What side effects may I notice from receiving this medicine? Side effects that you should report to your doctor or health care professional as soon as possible: -allergic reactions like skin rash, itching or hives, swelling of  the face, lips, or tongue -breathing problems -confusion -feeling faint or lightheaded, falls -stomach pain -yellowing of the eyes or skin Side effects that usually do not require medical attention (report to your doctor or health care professional if they continue or are bothersome): -nausea, vomiting -stomach upset This list may not describe all possible side effects. Call your doctor for medical advice about side effects. You may report side effects to FDA at 1-800-FDA-1088. Where should I keep my medicine? Keep out of the reach of children. This medicine can be abused. Keep your medicine in a safe place to protect it from theft. Do not share this medicine with anyone. Selling or giving away this medicine is dangerous and against the law. Store at room temperature between 15 and 30 degrees C (59 and 86 degrees F). Protect from light. Keep container tightly closed.  Throw away any unused medicine after the expiration date. Discard unused medicine and used packaging carefully. Pets and children can be harmed if they find used or lost packages. NOTE: This sheet is a summary. It may not cover all possible information. If you have questions about this medicine, talk to your doctor, pharmacist, or health care provider.  2014, Elsevier/Gold Standard. (2012-09-21 13:15:56)

## 2013-06-08 NOTE — ED Provider Notes (Signed)
CSN: [id number]     Arrival date & time 06/08/13  0920 History   First MD Initiated Contact with Patient 06/08/13 [phone]     Chief Complaint  Patient presents with  . Ankle Pain     (Consider location/radiation/quality/duration/timing/severity/associated sxs/prior Treatment) HPI  [name] Is a 38 year old female with a past history of morbid obesity, hypertension, diabetes he presents emergency Department with chief complaint of right ankle pain.  Patient states that yesterday she was getting off of the bus when she had them in version injury of her right ankle.  C. she had immediate pain and swelling was unable to ambulate.  Patient states that later on she did have some ability to limp on that ankle however she had severe throbbing pain.  She denies any numbness or tingling in the toes.  The patient is significantly hypertensive.  The patient states that she has been taking her medication however she has not followed up for better control of her pressures or evaluation with her primary care physician.  She denies any headache, chest pain, shortness of breath, or any other neurologic complaint at this time.  Past Medical History  Diagnosis Date  . Morbid obesity   . Hypertension   . Diabetes mellitus without complication    Past Surgical History  Procedure Laterality Date  . No past surgeries     Family History  Problem Relation Age of Onset  . Diabetes Mother    History  Substance Use Topics  . Smoking status: Never Smoker   . Smokeless tobacco: Never Used  . Alcohol Use: No   OB History   Grav Para Term Preterm Abortions TAB SAB Ect Mult Living   3 3 3  0 0 0 0 0 0 3     Review of Systems  Ten systems reviewed and are negative for acute change, except as noted in the HPI.    Allergies  Azithromycin  Home Medications   Prior to Admission medications   Medication Sig Start Date End Date Taking? Authorizing Provider  albuterol (PROVENTIL HFA;VENTOLIN HFA) 108  (90 BASE) MCG/ACT inhaler Inhale 2 puffs into the lungs every 4 (four) hours as needed for wheezing or shortness of breath. 02/21/13  Yes Arthor CaptainAbigail Kemond Amorin, PA-C  metFORMIN (GLUCOPHAGE) 500 MG tablet Take 1 tablet (500 mg total) by mouth 2 (two) times daily with a meal. 02/21/13  Yes Chistopher Mangino, PA-C  triamterene-hydrochlorothiazide (MAXZIDE) 75-50 MG per tablet Take 2 tablets by mouth daily.    Yes Historical Provider, MD   BP 193/116  Pulse 97  Temp(Src) 98.4 F (36.9 C) (Oral)  Resp 17  SpO2 100%  LMP 05/16/2013 Physical Exam Physical Exam  Nursing note and vitals reviewed. Constitutional: She is oriented to person, place, and time. She appears well-developed and well-nourished. No distress.  HENT:  Head: Normocephalic and atraumatic.  Eyes: Conjunctivae normal and EOM are normal. Pupils are equal, round, and reactive to light. No scleral icterus.  Neck: Normal range of motion.  Cardiovascular: Normal rate, regular rhythm and normal heart sounds.  Exam reveals no gallop and no friction rub.   No murmur heard. Pulmonary/Chest: Effort normal and breath sounds normal. No respiratory distress.  Abdominal: Soft. Bowel sounds are normal. She exhibits no distension and no mass. There is no tenderness. There is no guarding.  Musculoskeletal: Right ankle examination is performed.  She is moderate swelling worse over the right lateral malleolus.  Distal pulses are intact.  She is exquisitely tender to palpation.  Range of motion is limited due to pain, strength is limited due to pain.  She has tenderness over the posterior edge of the malleolus no fifth metatarsal head tenderness.  No medial ankle tenderness  Neurological: She is alert and oriented to person, place, and time.  Skin: Skin is warm and dry. She is not diaphoretic.    ED Course  Procedures (including critical care time) Labs Review Labs Reviewed - No data to display  Imaging Review Dg Ankle Complete Right  06/08/2013    CLINICAL DATA:  Stepped off a bus night ago twisting RIGHT ankle, severe RIGHT ankle pain, limited range of motion, history hypertension, diabetes  EXAM: RIGHT ANKLE - COMPLETE 3+ VIEW  COMPARISON:  None  FINDINGS: Soft tissue swelling at ankle.  Osseous mineralization normal.  Joint spaces preserved.  Spurring at medial malleolus with question non fused ossicle.  Plantar and Achilles insertion calcaneal spurring.  Additional spurring/stent these op at the at the plantar aspect of the distal calcaneus.  No acute fracture, dislocation or bone destruction.  IMPRESSION: Spurring at calcaneus at medial malleolus.  Soft tissue swelling.  No definite acute bony findings.   Electronically Signed   By: Ulyses SouthwardMark  Boles M.D.   On: 06/08/2013 11:22     EKG Interpretation None      MDM   Final diagnoses:  None    Patient with right ankle sprain.  Patient will be given crutches and Cam Walker boot.  Ice she is to followup with her primary care regarding her hypertension.  She is also to follow up with orthopedic or to her ankle.   Arthor Captainbigail Jammy Plotkin, PA-C 06/08/13 1149

## 2013-06-08 NOTE — Progress Notes (Signed)
Orthopedic Tech Progress Note Patient Details:  Linda HarmsDavenia [name] 10/09/1975 [id number]  Ortho Devices Type of Ortho Device: CAM walker;Crutches Ortho Device/Splint Interventions: Application   Mickie BailJennifer Carol Cammer 06/08/2013, 12:04 PM

## 2013-06-08 NOTE — ED Notes (Addendum)
Reports stepping wrong way yesterday and injuring right ankle. No acute distress noted at triage. Hypertensive at triage.

## 2013-07-20 ENCOUNTER — Encounter (HOSPITAL_COMMUNITY): Payer: Self-pay | Admitting: Emergency Medicine

## 2013-07-20 ENCOUNTER — Emergency Department (INDEPENDENT_AMBULATORY_CARE_PROVIDER_SITE_OTHER)
Admission: EM | Admit: 2013-07-20 | Discharge: 2013-07-20 | Disposition: A | Discharge status: Home / Self Care | Payer: Medicaid Other | Source: Home / Self Care | Attending: Emergency Medicine | Admitting: Emergency Medicine

## 2013-07-20 DIAGNOSIS — J45909 Unspecified asthma, uncomplicated: Secondary | ICD-10-CM

## 2013-07-20 DIAGNOSIS — I10 Essential (primary) hypertension: Secondary | ICD-10-CM

## 2013-07-20 MED ORDER — CLONIDINE HCL 0.1 MG PO TABS
ORAL_TABLET | ORAL | Status: AC
  Filled 2013-07-20: qty 2

## 2013-07-20 MED ORDER — IPRATROPIUM-ALBUTEROL 0.5-2.5 (3) MG/3ML IN SOLN
RESPIRATORY_TRACT | Status: AC
  Filled 2013-07-20: qty 3

## 2013-07-20 MED ORDER — ALBUTEROL SULFATE HFA 108 (90 BASE) MCG/ACT IN AERS: 2.0000 | INHALATION_SPRAY | RESPIRATORY_TRACT | Status: DC | PRN

## 2013-07-20 MED ORDER — ALBUTEROL SULFATE (2.5 MG/3ML) 0.083% IN NEBU
5.0000 mg | INHALATION_SOLUTION | Freq: Once | RESPIRATORY_TRACT | Status: AC
  Administered 2013-07-20: 5 mg via RESPIRATORY_TRACT

## 2013-07-20 MED ORDER — IPRATROPIUM-ALBUTEROL 0.5-2.5 (3) MG/3ML IN SOLN
3.0000 mL | Freq: Once | RESPIRATORY_TRACT | Status: AC
  Administered 2013-07-20: 3 mL via RESPIRATORY_TRACT

## 2013-07-20 MED ORDER — ALBUTEROL SULFATE (2.5 MG/3ML) 0.083% IN NEBU
INHALATION_SOLUTION | RESPIRATORY_TRACT | Status: AC
  Filled 2013-07-20: qty 6

## 2013-07-20 MED ORDER — CLONIDINE HCL 0.1 MG PO TABS
0.2000 mg | ORAL_TABLET | Freq: Once | ORAL | Status: AC
  Administered 2013-07-20: 0.2 mg via ORAL

## 2013-07-20 MED ORDER — METHYLPREDNISOLONE ACETATE 80 MG/ML IJ SUSP
80.0000 mg | Freq: Once | INTRAMUSCULAR | Status: AC
  Administered 2013-07-20: 80 mg via INTRAMUSCULAR

## 2013-07-20 MED ORDER — METHYLPREDNISOLONE ACETATE 80 MG/ML IJ SUSP
INTRAMUSCULAR | Status: AC
  Filled 2013-07-20: qty 1

## 2013-07-20 MED ORDER — BECLOMETHASONE DIPROPIONATE 80 MCG/ACT IN AERS: 2.0000 | INHALATION_SPRAY | Freq: Two times a day (BID) | RESPIRATORY_TRACT | Status: DC

## 2013-07-20 MED ORDER — CLONIDINE HCL 0.2 MG PO TABS: 0.2000 mg | ORAL_TABLET | Freq: Two times a day (BID) | ORAL | Status: DC

## 2013-07-20 NOTE — ED Provider Notes (Signed)
Chief Complaint   Chief Complaint  Patient presents with  . Asthma    History of Present Illness   [name] is a 38 year old female who has had a lifelong history of asthma. She had an inhaler but ran out of them. She's had a flareup recently secondary to a viral upper respiratory infection. She's had nasal congestion, headache, cough productive yellow sputum, sore throat, and wheezing. She denies any fever or chills. She's never been hospitalized for asthma and has not had any recent hospital or urgent care visits and no recent courses of steroids. She has wheezing both during the day and at night and with exertion and at rest.  Review of Systems   Other than as noted above, the patient denies any of the following symptoms. Systemic:  No fever, chills, or sweats. ENT:  No nasal congestion, sneezing, rhinorrhea, or sore throat. Lungs:  No cough, sputum production, or shortness of breath. No chest pain. Skin:  No rash or itching.  PMFSH   Past medical history, family history, social history, meds, and allergies were reviewed. She's on Maxzide and metformin for high blood pressure and diabetes respectively.  Physical Examination    Vital signs:  BP 188/128  Pulse 107  Temp(Src) 98.3 F (36.8 C) (Oral)  Resp 18  SpO2 98%  LMP 07/16/2013 General:  Alert, in no distress. Able to speak in full sentences. Eye:  No conjunctival injection or drainage. Lids were normal. ENT:  TMs and canals were normal, without erythema or inflammation.  Nasal mucosa was clear and uncongested, without drainage.  Mucous membranes were moist.  Pharynx was clear, without exudate or drainage.  There were no oral ulcerations or lesions. Neck:  Supple, no adenopathy, tenderness or mass. Lungs:  No retractions or use of accessory muscles.  No respiratory distress.  Lungs show bilateral expiratory wheezes and rhonchi, no rales, good air movement bilaterally. Heart:  Regular rhythm, without gallops, murmers  or rubs. Skin:  Clear, warm, and dry, without rash or lesions.  Course in Urgent Care Center   The following medications were given:  Medications  albuterol (PROVENTIL) (2.5 MG/3ML) 0.083% nebulizer solution 5 mg (5 mg Nebulization Given 07/20/13 0939)  ipratropium-albuterol (DUONEB) 0.5-2.5 (3) MG/3ML nebulizer solution 3 mL (3 mLs Nebulization Given 07/20/13 0939)  methylPREDNISolone acetate (DEPO-MEDROL) injection 80 mg (80 mg Intramuscular Given 07/20/13 0939)  cloNIDine (CATAPRES) tablet 0.2 mg (0.2 mg Oral Given 07/20/13 0939)  albuterol (PROVENTIL) (2.5 MG/3ML) 0.083% nebulizer solution 5 mg (5 mg Nebulization Given 07/20/13 1027)  ipratropium-albuterol (DUONEB) 0.5-2.5 (3) MG/3ML nebulizer solution 3 mL (3 mLs Nebulization Given 07/20/13 1027)  cloNIDine (CATAPRES) tablet 0.2 mg (0.2 mg Oral Given 07/20/13 1028)    After the second breathing treatment her lungs were allowed clear although not completely wheeze free. She stated she felt a lot better and felt she could go home at this point. She was given 2 doses of clonidine, and her blood pressure came down some although still not normal.    Assessment   The primary encounter diagnosis was Asthma. A diagnosis of Hypertension was also pertinent to this visit.  She'll need followup for both her asthma and her high blood pressure in the near future. Recommended she see her primary care doctor within the next week.  Plan    1.  Meds:  The following meds were prescribed:   Discharge Medication List as of 07/20/2013 11:08 AM    START taking these medications   Details  !!  albuterol (PROVENTIL HFA;VENTOLIN HFA) 108 (90 BASE) MCG/ACT inhaler Inhale 2 puffs into the lungs every 4 (four) hours as needed for wheezing or shortness of breath., Starting 07/20/2013, Until Discontinued, Normal    beclomethasone (QVAR) 80 MCG/ACT inhaler Inhale 2 puffs into the lungs 2 (two) times daily., Starting 07/20/2013, Until Discontinued, Normal    cloNIDine  (CATAPRES) 0.2 MG tablet Take 1 tablet (0.2 mg total) by mouth 2 (two) times daily., Starting 07/20/2013, Until Discontinued, Normal     !! - Potential duplicate medications found. Please discuss with provider.      2.  Patient Education/Counseling:  The patient was given appropriate handouts, self care instructions, and instructed in symptomatic relief.    3.  Follow up:  The patient was told to follow up here if no better in 2 days, or sooner if becoming worse in any way, and given some red flag symptoms such as increasing difficulty breathing which would prompt immediate return.  Followup with primary care physician within the next week.       Reuben Likesavid C Fabrizzio Marcella, MD 07/20/13 [phone]

## 2013-07-20 NOTE — ED Notes (Signed)
Pt c/o asthma flare up onset 2 weeks Sx started to get worse last night Has been using mother's inhaler Sx today include: SOB, wheezing, productive cough Denies f/v/n/d BP today = 223/125 Reports she just took her BP med before coming in Denies CP, HA, blurry vision, weakness, diaphoresis Alert and talking in complete sentences w/no signs of acute distress.

## 2013-07-20 NOTE — Discharge Instructions (Signed)
Asthma Attack Prevention Although there is no way to prevent asthma from starting, you can take steps to control the disease and reduce its symptoms. Learn about your asthma and how to control it. Take an active role to control your asthma by working with your health care provider to create and follow an asthma action plan. An asthma action plan guides you in:  Taking your medicines properly.  Avoiding things that set off your asthma or make your asthma worse (asthma triggers).  Tracking your level of asthma control.  Responding to worsening asthma.  Seeking emergency care when needed. To track your asthma, keep records of your symptoms, check your peak flow number using a handheld device that shows how well air moves out of your lungs (peak flow meter), and get regular asthma checkups.  WHAT ARE SOME WAYS TO PREVENT AN ASTHMA ATTACK?  Take medicines as directed by your health care provider.  Keep track of your asthma symptoms and level of control.  With your health care provider, write a detailed plan for taking medicines and managing an asthma attack. Then be sure to follow your action plan. Asthma is an ongoing condition that needs regular monitoring and treatment.  Identify and avoid asthma triggers. Many outdoor allergens and irritants (such as pollen, mold, cold air, and air pollution) can trigger asthma attacks. Find out what your asthma triggers are and take steps to avoid them.  Monitor your breathing. Learn to recognize warning signs of an attack, such as coughing, wheezing, or shortness of breath. Your lung function may decrease before you notice any signs or symptoms, so regularly measure and record your peak airflow with a home peak flow meter.  Identify and treat attacks early. If you act quickly, you are less likely to have a severe attack. You will also need less medicine to control your symptoms. When your peak flow measurements decrease and alert you to an upcoming attack,  take your medicine as instructed and immediately stop any activity that may have triggered the attack. If your symptoms do not improve, get medical help.  Pay attention to increasing quick-relief inhaler use. If you find yourself relying on your quick-relief inhaler, your asthma is not under control. See your health care provider about adjusting your treatment. WHAT CAN MAKE MY SYMPTOMS WORSE? A number of common things can set off or make your asthma symptoms worse and cause temporary increased inflammation of your airways. Keep track of your asthma symptoms for several weeks, detailing all the environmental and emotional factors that are linked with your asthma. When you have an asthma attack, go back to your asthma diary to see which factor, or combination of factors, might have contributed to it. Once you know what these factors are, you can take steps to control many of them. If you have allergies and asthma, it is important to take asthma prevention steps at home. Minimizing contact with the substance to which you are allergic will help prevent an asthma attack. Some triggers and ways to avoid these triggers are: Animal Dander:  Some people are allergic to the flakes of skin or dried saliva from animals with fur or feathers.   There is no such thing as a hypoallergenic dog or cat breed. All dogs or cats can cause allergies, even if they don't shed.  Keep these pets out of your home.  If you are not able to keep a pet outdoors, keep the pet out of your bedroom and other sleeping areas at all  times, and keep the door closed.  Remove carpets and furniture covered with cloth from your home. If that is not possible, keep the pet away from fabric-covered furniture and carpets. Dust Mites: Many people with asthma are allergic to dust mites. Dust mites are tiny bugs that are found in every home in mattresses, pillows, carpets, fabric-covered furniture, bedcovers, clothes, stuffed toys, and other  fabric-covered items.   Cover your mattress in a special dust-proof cover.  Cover your pillow in a special dust-proof cover, or wash the pillow each week in hot water. Water must be hotter than 130 F (54.4 C) to kill dust mites. Cold or warm water used with detergent and bleach can also be effective.  Wash the sheets and blankets on your bed each week in hot water.  Try not to sleep or lie on cloth-covered cushions.  Call ahead when traveling and ask for a smoke-free hotel room. Bring your own bedding and pillows in case the hotel only supplies feather pillows and down comforters, which may contain dust mites and cause asthma symptoms.  Remove carpets from your bedroom and those laid on concrete, if you can.  Keep stuffed toys out of the bed, or wash the toys weekly in hot water or cooler water with detergent and bleach. Cockroaches: Many people with asthma are allergic to the droppings and remains of cockroaches.   Keep food and garbage in closed containers. Never leave food out.  Use poison baits, traps, powders, gels, or paste (for example, boric acid).  If a spray is used to kill cockroaches, stay out of the room until the odor goes away. Indoor Mold:  Fix leaky faucets, pipes, or other sources of water that have mold around them.  Clean floors and moldy surfaces with a fungicide or diluted bleach.  Avoid using humidifiers, vaporizers, or swamp coolers. These can spread molds through the air. Pollen and Outdoor Mold:  When pollen or mold spore counts are high, try to keep your windows closed.  Stay indoors with windows closed from late morning to afternoon. Pollen and some mold spore counts are highest at that time.  Ask your health care provider whether you need to take anti-inflammatory medicine or increase your dose of the medicine before your allergy season starts. Other Irritants to Avoid:  Tobacco smoke is an irritant. If you smoke, ask your health care provider how  you can quit. Ask family members to quit smoking too. Do not allow smoking in your home or car.  If possible, do not use a wood-burning stove, kerosene heater, or fireplace. Minimize exposure to all sources of smoke, including to incense, candles, fires, and fireworks.  Try to stay away from strong odors and sprays, such as perfume, talcum powder, hair spray, and paints.  Decrease humidity in your home and use an indoor air cleaning device. Reduce indoor humidity to below 60%. Dehumidifiers or central air conditioners can do this.  Decrease house dust exposure by changing furnace and air cooler filters frequently.  Try to have someone else vacuum for you once or twice a week. Stay out of rooms while they are being vacuumed and for a short while afterward.  If you vacuum, use a dust mask from a hardware store, a double-layered or microfilter vacuum cleaner bag, or a vacuum cleaner with a HEPA filter.  Sulfites in foods and beverages can be irritants. Do not drink beer or wine or eat dried fruit, processed potatoes, or shrimp if they cause asthma symptoms.  Cold air can trigger an asthma attack. Cover your nose and mouth with a scarf on cold or windy days.  Several health conditions can make asthma more difficult to manage, including a runny nose, sinus infections, reflux disease, psychological stress, and sleep apnea. Work with your health care provider to manage these conditions.  Avoid close contact with people who have a respiratory infection such as a cold or the flu, since your asthma symptoms may get worse if you catch the infection. Wash your hands thoroughly after touching items that may have been handled by people with a respiratory infection.  Get a flu shot every year to protect against the flu virus, which often makes asthma worse for days or weeks. Also get a pneumonia shot if you have not previously had one. Unlike the flu shot, the pneumonia shot does not need to be given  yearly. Medicines:  Talk to your health care provider about whether it is safe for you to take aspirin or non-steroidal anti-inflammatory medicines (NSAIDs). In a small number of people with asthma, aspirin and NSAIDs can cause asthma attacks. These medicines must be avoided by people who have known aspirin-sensitive asthma. It is important that people with aspirin-sensitive asthma read labels of all over-the-counter medicines used to treat pain, colds, coughs, and fever.  Beta blockers and ACE inhibitors are other medicines you should discuss with your health care provider. HOW CAN I FIND OUT WHAT I AM ALLERGIC TO? Ask your asthma health care provider about allergy skin testing or blood testing (the RAST test) to identify the allergens to which you are sensitive. If you are found to have allergies, the most important thing to do is to try to avoid exposure to any allergens that you are sensitive to as much as possible. Other treatments for allergies, such as medicines and allergy shots (immunotherapy) are available.  CAN I EXERCISE? Follow your health care provider's advice regarding asthma treatment before exercising. It is important to maintain a regular exercise program, but vigorous exercise, or exercise in cold, humid, or dry environments can cause asthma attacks, especially for those people who have exercise-induced asthma. Document Released: 01/16/2009 Document Revised: 09/30/2012 Document Reviewed: 08/05/2012 [facility] Patient Information 2014 [place], [place].   Blood pressure over the ideal can put you at higher risk for stroke, heart disease, and kidney failure.  For this reason, it's important to try to get your blood pressure as close as possible to the ideal.  The ideal blood pressure is 120/80.  Blood pressures from 120-139 systolic over 80-89 diastolic are labeled as "prehypertension."  This means you are at higher risk of developing hypertension in the future.  Blood pressures in this  range are not treated with medication, but lifestyle changes are recommended to prevent progression to hypertension.  Blood pressures of 140 and above systolic over 90 and above diastolic are classified as hypertension and are treated with medications.  Lifestyle changes which can benefit both prehypertension and hypertension include the following:   Salt and sodium restriction.  Weight loss.  Regular exercise.  Avoidance of tobacco.  Avoidance of excess alcohol.  The "D.A.S.H" diet.   People with hypertension and prehypertension should limit their salt intake to less than 1500 mg daily.  Reading the nutrition information on the label of many prepared foods can give you an idea of how much sodium you're consuming at each meal.  Remember that the most important number on the nutrition information is the serving size.  It may be  smaller than you think.  Try to avoid adding extra salt at the table.  You may add small amounts of salt while cooking.  Remember that salt is an acquired taste and you may get used to a using a whole lot less salt than you are using now.  Using less salt lets the food's natural flavors come through.  You might want to consider using salt substitutes, potassium chloride, pepper, or blends of herbs and spices to enhance the flavor of your food.  Foods that contain the most salt include: processed meats (like ham, bacon, lunch meat, sausage, hot dogs, and breakfast meat), chips, pretzels, salted nuts, soups, salty snacks, canned foods, junk food, fast food, restaurant food, mustard, pickles, pizza, popcorn, soy sauce, and worcestershire sauce--quite a list!  You might ask, "Is there anything I can eat?"  The answer is, "yes."  Fruits and vegetables are usually low in salt.  Fresh is better than frozen which is better than canned.  If you have canned vegetables, you can cut down on the salt content by rinsing them in tap water 3 times before cooking.     Weight loss is the  second thing you can do to lower your blood pressure.  Getting to and maintaining ideal weight will often normalize your blood pressure and allow you to avoid medications, entirely, cut way down on your dosage of medications, or allow to wean off your meds.  (Note, this should only be done under the supervision of your primary care doctor.)  Of course, weight loss takes time and you may need to be on medication in the meantime.  You shoot for a body mass index of 20-25.  When you go to the urgent care or to your primary care doctor, they should calculate your BMI.  If you don't know what it is, ask.  You can calculate your BMI with the following formula:  Weight in pounds x 703/ (height in inches) x (height in inches).  There are many good diets out there: Weight Watchers and the D.A.S.H. Diet are the best, but often, just modifying a few factors can be helpful:  Don't skip meals, don't eat out, and keeping a food diary.  I do not recommend fad diets or diet pills which often raise blood pressure.    Everyone should get regular exercise, but this is particularly important for people with high blood pressure.  Just about any exercise is good.  The only exercise which may be harmful is lifting extreme heavy weights.  I recommend moderate exercise such as walking for 30 minutes 5 days a week.  Going to the gym for a 50 minute workout 3 times a week is also good.  This amounts to 150 minutes of exercise weekly.   Anyone with high blood pressure should avoid any use of tobacco.  Tobacco use does not elevate blood pressure, but it increases the risk of heart disease and stroke.  If you are interested in quitting, discuss with your doctor how to quit.  If you are not interested in quitting, ask yourself, "What would my life be like in 10 years if I continue to smoke?"  "How will I know when it is time to quit?"  "How would my life be better if I were to quit."   Excess alcohol intake can raise the blood pressure.   The safe alcohol intake is 2 drinks or less per day for men and 1 drink per day or less for women.  There is a very good diet which I recommend that has been designed for people with blood pressure called the D.A.S.H. Diet (dietary approaches to stop hypertension).  It consists of fruits, vegetables, lean meats, low fat dairy, whole grains, nuts and seeds.  It is very low in salt and sodium.  It has also been found to have other beneficial health effects such as lowering cholesterol and helping lose weight.  It has been developed by the [employer] and can be downloaded from the internet without any cost. Just do a Programmer, multimedia on "D.A.S.H. Diet." or go the NIH website (GolfingPosters.tn).  There are also cookbooks and diet plans that can be gotten from [place] to help you with this diet.   DASH Diet The DASH diet stands for "Dietary Approaches to Stop Hypertension." It is a healthy eating plan that has been shown to reduce high blood pressure (hypertension) in as little as 14 days, while also possibly providing other significant health benefits. These other health benefits include reducing the risk of breast cancer after menopause and reducing the risk of type 2 diabetes, heart disease, colon cancer, and stroke. Health benefits also include weight loss and slowing kidney failure in patients with chronic kidney disease.  DIET GUIDELINES  Limit salt (sodium). Your diet should contain less than 1500 mg of sodium daily.  Limit refined or processed carbohydrates. Your diet should include mostly whole grains. Desserts and added sugars should be used sparingly.  Include small amounts of heart-healthy fats. These types of fats include nuts, oils, and tub margarine. Limit saturated and trans fats. These fats have been shown to be harmful in the body. CHOOSING FOODS  The following food groups are based on a 2000 calorie diet. See your Registered Dietitian for individual calorie needs. Grains and  Grain Products (6 to 8 servings daily)  Eat More Often: Whole-wheat bread, brown rice, whole-grain or wheat pasta, quinoa, popcorn without added fat or salt (air popped).  Eat Less Often: White bread, white pasta, white rice, cornbread. Vegetables (4 to 5 servings daily)  Eat More Often: Fresh, frozen, and canned vegetables. Vegetables may be raw, steamed, roasted, or grilled with a minimal amount of fat.  Eat Less Often/Avoid: Creamed or fried vegetables. Vegetables in a cheese sauce. Fruit (4 to 5 servings daily)  Eat More Often: All fresh, canned (in natural juice), or frozen fruits. Dried fruits without added sugar. One hundred percent fruit juice ( cup [237 mL] daily).  Eat Less Often: Dried fruits with added sugar. Canned fruit in light or heavy syrup. [employer], Fish, and Poultry (2 servings or less daily. One serving is 3 to 4 oz [85-114 g]).  Eat More Often: Ninety percent or leaner ground beef, tenderloin, sirloin. Round cuts of beef, chicken breast, [place] breast. All fish. Grill, bake, or broil your meat. Nothing should be fried.  Eat Less Often/Avoid: Fatty cuts of meat, [place], or chicken leg, thigh, or wing. Fried cuts of meat or fish. Dairy (2 to 3 servings)  Eat More Often: Low-fat or fat-free milk, low-fat plain or light yogurt, reduced-fat or part-skim cheese.  Eat Less Often/Avoid: Milk (whole, 2%).Whole milk yogurt. Full-fat cheeses. Nuts, Seeds, and Legumes (4 to 5 servings per week)  Eat More Often: All without added salt.  Eat Less Often/Avoid: Salted nuts and seeds, canned beans with added salt. Fats and Sweets (limited)  Eat More Often: Vegetable oils, tub margarines without trans fats, sugar-free gelatin. Mayonnaise and salad dressings.  Eat Less Often/Avoid: Coconut oils, palm oils, butter, stick margarine, cream, half and half, cookies, candy, pie. FOR MORE INFORMATION The Dash Diet Eating Plan: www.dashdiet.org Document Released: 01/17/2011  Document Revised: 04/22/2011 Document Reviewed: 01/17/2011 [facility] Patient Information 2014 [place], [place].

## 2013-08-30 ENCOUNTER — Emergency Department (HOSPITAL_COMMUNITY): Payer: Medicaid Other | Admitting: Anesthesiology

## 2013-08-30 ENCOUNTER — Inpatient Hospital Stay (HOSPITAL_COMMUNITY): Payer: Medicaid Other

## 2013-08-30 ENCOUNTER — Encounter (HOSPITAL_COMMUNITY)
Admission: EM | Disposition: A | Discharge status: Skilled Nursing Facility | Payer: Self-pay | Source: Home / Self Care | Attending: Internal Medicine

## 2013-08-30 ENCOUNTER — Inpatient Hospital Stay (HOSPITAL_COMMUNITY)
Admission: EM | Admit: 2013-08-30 | Discharge: 2013-09-13 | DRG: 871 | Disposition: A | Discharge status: Skilled Nursing Facility | Payer: Medicaid Other | Attending: Internal Medicine | Admitting: Internal Medicine

## 2013-08-30 ENCOUNTER — Encounter (HOSPITAL_COMMUNITY): Payer: Self-pay | Admitting: Emergency Medicine

## 2013-08-30 ENCOUNTER — Encounter (HOSPITAL_COMMUNITY): Payer: Medicaid Other | Admitting: Anesthesiology

## 2013-08-30 DIAGNOSIS — L02419 Cutaneous abscess of limb, unspecified: Secondary | ICD-10-CM | POA: Diagnosis present

## 2013-08-30 DIAGNOSIS — E781 Pure hyperglyceridemia: Secondary | ICD-10-CM | POA: Diagnosis present

## 2013-08-30 DIAGNOSIS — Z6841 Body Mass Index (BMI) 40.0 and over, adult: Secondary | ICD-10-CM | POA: Diagnosis not present

## 2013-08-30 DIAGNOSIS — A419 Sepsis, unspecified organism: Secondary | ICD-10-CM

## 2013-08-30 DIAGNOSIS — G4733 Obstructive sleep apnea (adult) (pediatric): Secondary | ICD-10-CM | POA: Diagnosis present

## 2013-08-30 DIAGNOSIS — E871 Hypo-osmolality and hyponatremia: Secondary | ICD-10-CM | POA: Diagnosis present

## 2013-08-30 DIAGNOSIS — R6520 Severe sepsis without septic shock: Secondary | ICD-10-CM

## 2013-08-30 DIAGNOSIS — Z8673 Personal history of transient ischemic attack (TIA), and cerebral infarction without residual deficits: Secondary | ICD-10-CM

## 2013-08-30 DIAGNOSIS — I6340 Cerebral infarction due to embolism of unspecified cerebral artery: Secondary | ICD-10-CM | POA: Diagnosis not present

## 2013-08-30 DIAGNOSIS — L02219 Cutaneous abscess of trunk, unspecified: Secondary | ICD-10-CM | POA: Diagnosis present

## 2013-08-30 DIAGNOSIS — E861 Hypovolemia: Secondary | ICD-10-CM | POA: Diagnosis present

## 2013-08-30 DIAGNOSIS — I96 Gangrene, not elsewhere classified: Secondary | ICD-10-CM | POA: Diagnosis present

## 2013-08-30 DIAGNOSIS — I330 Acute and subacute infective endocarditis: Secondary | ICD-10-CM | POA: Diagnosis present

## 2013-08-30 DIAGNOSIS — D509 Iron deficiency anemia, unspecified: Secondary | ICD-10-CM | POA: Diagnosis present

## 2013-08-30 DIAGNOSIS — R197 Diarrhea, unspecified: Secondary | ICD-10-CM | POA: Diagnosis not present

## 2013-08-30 DIAGNOSIS — Z881 Allergy status to other antibiotic agents status: Secondary | ICD-10-CM

## 2013-08-30 DIAGNOSIS — I2690 Septic pulmonary embolism without acute cor pulmonale: Secondary | ICD-10-CM | POA: Diagnosis present

## 2013-08-30 DIAGNOSIS — E1165 Type 2 diabetes mellitus with hyperglycemia: Secondary | ICD-10-CM

## 2013-08-30 DIAGNOSIS — E119 Type 2 diabetes mellitus without complications: Secondary | ICD-10-CM

## 2013-08-30 DIAGNOSIS — J9600 Acute respiratory failure, unspecified whether with hypoxia or hypercapnia: Secondary | ICD-10-CM | POA: Diagnosis not present

## 2013-08-30 DIAGNOSIS — K219 Gastro-esophageal reflux disease without esophagitis: Secondary | ICD-10-CM | POA: Diagnosis present

## 2013-08-30 DIAGNOSIS — E872 Acidosis, unspecified: Secondary | ICD-10-CM | POA: Diagnosis not present

## 2013-08-30 DIAGNOSIS — L03119 Cellulitis of unspecified part of limb: Secondary | ICD-10-CM

## 2013-08-30 DIAGNOSIS — J9601 Acute respiratory failure with hypoxia: Secondary | ICD-10-CM

## 2013-08-30 DIAGNOSIS — J45909 Unspecified asthma, uncomplicated: Secondary | ICD-10-CM | POA: Diagnosis present

## 2013-08-30 DIAGNOSIS — E1310 Other specified diabetes mellitus with ketoacidosis without coma: Secondary | ICD-10-CM | POA: Diagnosis present

## 2013-08-30 DIAGNOSIS — I10 Essential (primary) hypertension: Secondary | ICD-10-CM | POA: Diagnosis present

## 2013-08-30 DIAGNOSIS — R159 Full incontinence of feces: Secondary | ICD-10-CM | POA: Diagnosis not present

## 2013-08-30 DIAGNOSIS — Z91199 Patient's noncompliance with other medical treatment and regimen due to unspecified reason: Secondary | ICD-10-CM

## 2013-08-30 DIAGNOSIS — R29898 Other symptoms and signs involving the musculoskeletal system: Secondary | ICD-10-CM | POA: Diagnosis not present

## 2013-08-30 DIAGNOSIS — N179 Acute kidney failure, unspecified: Secondary | ICD-10-CM | POA: Diagnosis present

## 2013-08-30 DIAGNOSIS — I38 Endocarditis, valve unspecified: Secondary | ICD-10-CM | POA: Diagnosis not present

## 2013-08-30 DIAGNOSIS — G9340 Encephalopathy, unspecified: Secondary | ICD-10-CM | POA: Diagnosis present

## 2013-08-30 DIAGNOSIS — Z833 Family history of diabetes mellitus: Secondary | ICD-10-CM | POA: Diagnosis not present

## 2013-08-30 DIAGNOSIS — L03319 Cellulitis of trunk, unspecified: Secondary | ICD-10-CM

## 2013-08-30 DIAGNOSIS — I517 Cardiomegaly: Secondary | ICD-10-CM | POA: Diagnosis not present

## 2013-08-30 DIAGNOSIS — Z9119 Patient's noncompliance with other medical treatment and regimen: Secondary | ICD-10-CM

## 2013-08-30 DIAGNOSIS — I639 Cerebral infarction, unspecified: Secondary | ICD-10-CM

## 2013-08-30 DIAGNOSIS — R509 Fever, unspecified: Secondary | ICD-10-CM | POA: Diagnosis present

## 2013-08-30 HISTORY — DX: Gastro-esophageal reflux disease without esophagitis: K21.9

## 2013-08-30 HISTORY — DX: Type 2 diabetes mellitus without complications: E11.9

## 2013-08-30 HISTORY — PX: IRRIGATION AND DEBRIDEMENT ABSCESS: SHX5252

## 2013-08-30 HISTORY — PX: INCISION AND DRAINAGE OF WOUND: SHX1803

## 2013-08-30 HISTORY — DX: Unspecified asthma, uncomplicated: J45.909

## 2013-08-30 HISTORY — DX: Cerebral infarction, unspecified: I63.9

## 2013-08-30 LAB — CBC WITH DIFFERENTIAL/PLATELET
BASOS PCT: 0 % (ref 0–1)
Basophils Absolute: 0 10*3/uL (ref 0.0–0.1)
EOS ABS: 0 10*3/uL (ref 0.0–0.7)
Eosinophils Relative: 0 % (ref 0–5)
HCT: 33.6 % — ABNORMAL LOW (ref 36.0–46.0)
Hemoglobin: 11.6 g/dL — ABNORMAL LOW (ref 12.0–15.0)
Lymphocytes Relative: 9 % — ABNORMAL LOW (ref 12–46)
Lymphs Abs: 2.2 10*3/uL (ref 0.7–4.0)
MCH: 27 pg (ref 26.0–34.0)
MCHC: 34.5 g/dL (ref 30.0–36.0)
MCV: 78.3 fL (ref 78.0–100.0)
MONO ABS: 1.4 10*3/uL — AB (ref 0.1–1.0)
Monocytes Relative: 6 % (ref 3–12)
NEUTROS ABS: 20.4 10*3/uL — AB (ref 1.7–7.7)
NEUTROS PCT: 85 % — AB (ref 43–77)
Platelets: 173 10*3/uL (ref 150–400)
RBC: 4.29 MIL/uL (ref 3.87–5.11)
RDW: 13.3 % (ref 11.5–15.5)
WBC: 24 10*3/uL — ABNORMAL HIGH (ref 4.0–10.5)

## 2013-08-30 LAB — COMPREHENSIVE METABOLIC PANEL
ALT: 20 U/L (ref 0–35)
ANION GAP: 23 — AB (ref 5–15)
AST: 36 U/L (ref 0–37)
Albumin: 1.7 g/dL — ABNORMAL LOW (ref 3.5–5.2)
Alkaline Phosphatase: 140 U/L — ABNORMAL HIGH (ref 39–117)
BILIRUBIN TOTAL: 0.4 mg/dL (ref 0.3–1.2)
BUN: 48 mg/dL — AB (ref 6–23)
CO2: 17 meq/L — AB (ref 19–32)
Calcium: 8.5 mg/dL (ref 8.4–10.5)
Chloride: 85 mEq/L — ABNORMAL LOW (ref 96–112)
Creatinine, Ser: 1.85 mg/dL — ABNORMAL HIGH (ref 0.50–1.10)
GFR calc Af Amer: 39 mL/min — ABNORMAL LOW (ref >90)
GFR calc non Af Amer: 34 mL/min — ABNORMAL LOW (ref >90)
Glucose, Bld: 430 mg/dL — ABNORMAL HIGH (ref 70–99)
Potassium: 4.6 mEq/L (ref 3.7–5.3)
Sodium: 125 mEq/L — ABNORMAL LOW (ref 137–147)
Total Protein: 8.1 g/dL (ref 6.0–8.3)

## 2013-08-30 LAB — POCT I-STAT 3, ART BLOOD GAS (G3+)
Acid-base deficit: 4 mmol/L — ABNORMAL HIGH (ref 0.0–2.0)
BICARBONATE: 21.8 meq/L (ref 20.0–24.0)
O2 SAT: 96 %
PCO2 ART: 43.6 mmHg (ref 35.0–45.0)
PO2 ART: 93 mmHg (ref 80.0–100.0)
Patient temperature: 98.6
TCO2: 23 mmol/L (ref 0–100)
pH, Arterial: 7.308 — ABNORMAL LOW (ref 7.350–7.450)

## 2013-08-30 LAB — GLUCOSE, CAPILLARY
GLUCOSE-CAPILLARY: 493 mg/dL — AB (ref 70–99)
Glucose-Capillary: 338 mg/dL — ABNORMAL HIGH (ref 70–99)

## 2013-08-30 LAB — MRSA PCR SCREENING: MRSA BY PCR: NEGATIVE

## 2013-08-30 LAB — I-STAT CG4 LACTIC ACID, ED: LACTIC ACID, VENOUS: 3.6 mmol/L — AB (ref 0.5–2.2)

## 2013-08-30 LAB — LACTIC ACID, PLASMA: Lactic Acid, Venous: 2.8 mmol/L — ABNORMAL HIGH (ref 0.5–2.2)

## 2013-08-30 LAB — HCG, SERUM, QUALITATIVE: Preg, Serum: NEGATIVE

## 2013-08-30 LAB — TRIGLYCERIDES: TRIGLYCERIDES: 550 mg/dL — AB (ref <150)

## 2013-08-30 SURGERY — IRRIGATION AND DEBRIDEMENT ABSCESS
Anesthesia: General | Site: Thigh | Laterality: Left

## 2013-08-30 MED ORDER — FENTANYL CITRATE 0.05 MG/ML IJ SOLN
INTRAMUSCULAR | Status: AC
  Filled 2013-08-30: qty 5

## 2013-08-30 MED ORDER — PANTOPRAZOLE SODIUM 40 MG IV SOLR
40.0000 mg | Freq: Every day | INTRAVENOUS | Status: DC
  Administered 2013-08-30 – 2013-08-31 (×2): 40 mg via INTRAVENOUS
  Filled 2013-08-30 (×5): qty 40

## 2013-08-30 MED ORDER — INSULIN REGULAR HUMAN 100 UNIT/ML IJ SOLN
INTRAMUSCULAR | Status: DC
  Administered 2013-08-31: 8.3 [IU]/h via INTRAVENOUS
  Filled 2013-08-30 (×2): qty 1

## 2013-08-30 MED ORDER — FENTANYL CITRATE 0.05 MG/ML IJ SOLN
INTRAMUSCULAR | Status: DC | PRN
  Administered 2013-08-30: 150 ug via INTRAVENOUS
  Administered 2013-08-30: 100 ug via INTRAVENOUS

## 2013-08-30 MED ORDER — SODIUM CHLORIDE 0.9 % IV BOLUS (SEPSIS)
1000.0000 mL | Freq: Once | INTRAVENOUS | Status: AC
  Administered 2013-08-30: 1000 mL via INTRAVENOUS

## 2013-08-30 MED ORDER — SODIUM CHLORIDE 0.9 % IV SOLN
INTRAVENOUS | Status: DC | PRN
  Administered 2013-08-30 (×3): via INTRAVENOUS

## 2013-08-30 MED ORDER — ONDANSETRON HCL 4 MG/2ML IJ SOLN
INTRAMUSCULAR | Status: DC | PRN
  Administered 2013-08-30: 4 mg via INTRAVENOUS

## 2013-08-30 MED ORDER — MIDAZOLAM HCL 2 MG/2ML IJ SOLN
INTRAMUSCULAR | Status: AC
  Filled 2013-08-30: qty 2

## 2013-08-30 MED ORDER — LIDOCAINE HCL (CARDIAC) 20 MG/ML IV SOLN
INTRAVENOUS | Status: AC
  Filled 2013-08-30: qty 5

## 2013-08-30 MED ORDER — SODIUM CHLORIDE 0.9 % IV SOLN
INTRAVENOUS | Status: DC
  Administered 2013-08-30 – 2013-08-31 (×2): via INTRAVENOUS

## 2013-08-30 MED ORDER — DEXTROSE-NACL 5-0.45 % IV SOLN: INTRAVENOUS | Status: DC

## 2013-08-30 MED ORDER — LIDOCAINE HCL (CARDIAC) 20 MG/ML IV SOLN
INTRAVENOUS | Status: DC | PRN
  Administered 2013-08-30: 40 mg via INTRAVENOUS

## 2013-08-30 MED ORDER — SUCCINYLCHOLINE CHLORIDE 20 MG/ML IJ SOLN
INTRAMUSCULAR | Status: DC | PRN
  Administered 2013-08-30: 100 mg via INTRAVENOUS

## 2013-08-30 MED ORDER — SODIUM CHLORIDE 0.9 % IV BOLUS (SEPSIS): 1000.0000 mL | Freq: Once | INTRAVENOUS | Status: DC

## 2013-08-30 MED ORDER — ALBUTEROL SULFATE (2.5 MG/3ML) 0.083% IN NEBU: 2.5000 mg | INHALATION_SOLUTION | RESPIRATORY_TRACT | Status: DC | PRN

## 2013-08-30 MED ORDER — CHLORHEXIDINE GLUCONATE 0.12 % MT SOLN
15.0000 mL | Freq: Two times a day (BID) | OROMUCOSAL | Status: DC
  Administered 2013-08-30 – 2013-09-13 (×18): 15 mL via OROMUCOSAL
  Filled 2013-08-30 (×22): qty 15

## 2013-08-30 MED ORDER — PROPOFOL 10 MG/ML IV EMUL
0.0000 ug/kg/min | INTRAVENOUS | Status: DC
  Administered 2013-08-30: 10 ug/kg/min via INTRAVENOUS
  Administered 2013-08-31 (×4): 40 ug/kg/min via INTRAVENOUS
  Filled 2013-08-30 (×2): qty 200
  Filled 2013-08-30: qty 100

## 2013-08-30 MED ORDER — VANCOMYCIN HCL IN DEXTROSE 1-5 GM/200ML-% IV SOLN
1000.0000 mg | Freq: Once | INTRAVENOUS | Status: AC
  Administered 2013-08-30: 1000 mg via INTRAVENOUS
  Filled 2013-08-30: qty 200

## 2013-08-30 MED ORDER — 0.9 % SODIUM CHLORIDE (POUR BTL) OPTIME
TOPICAL | Status: DC | PRN
  Administered 2013-08-30: 1000 mL

## 2013-08-30 MED ORDER — BUDESONIDE 0.25 MG/2ML IN SUSP
0.2500 mg | Freq: Two times a day (BID) | RESPIRATORY_TRACT | Status: DC
  Administered 2013-08-30 – 2013-09-13 (×19): 0.25 mg via RESPIRATORY_TRACT
  Filled 2013-08-30 (×22): qty 2

## 2013-08-30 MED ORDER — ARFORMOTEROL TARTRATE 15 MCG/2ML IN NEBU
15.0000 ug | INHALATION_SOLUTION | Freq: Two times a day (BID) | RESPIRATORY_TRACT | Status: DC
  Administered 2013-08-30 – 2013-09-13 (×19): 15 ug via RESPIRATORY_TRACT
  Filled 2013-08-30 (×39): qty 2

## 2013-08-30 MED ORDER — ROCURONIUM BROMIDE 100 MG/10ML IV SOLN
INTRAVENOUS | Status: DC | PRN
  Administered 2013-08-30 (×2): 25 mg via INTRAVENOUS

## 2013-08-30 MED ORDER — MIDAZOLAM HCL 2 MG/2ML IJ SOLN
2.0000 mg | INTRAMUSCULAR | Status: DC | PRN
  Administered 2013-08-30: 2 mg via INTRAVENOUS
  Filled 2013-08-30: qty 2

## 2013-08-30 MED ORDER — ARTIFICIAL TEARS OP OINT
TOPICAL_OINTMENT | OPHTHALMIC | Status: DC | PRN
  Administered 2013-08-30: 1 via OPHTHALMIC

## 2013-08-30 MED ORDER — HEPARIN SODIUM (PORCINE) 5000 UNIT/ML IJ SOLN
5000.0000 [IU] | Freq: Three times a day (TID) | INTRAMUSCULAR | Status: DC
  Administered 2013-08-30 – 2013-09-13 (×39): 5000 [IU] via SUBCUTANEOUS
  Filled 2013-08-30 (×39): qty 1

## 2013-08-30 MED ORDER — FENTANYL CITRATE 0.05 MG/ML IJ SOLN
100.0000 ug | INTRAMUSCULAR | Status: DC | PRN
  Administered 2013-08-30: 100 ug via INTRAVENOUS
  Filled 2013-08-30: qty 2

## 2013-08-30 MED ORDER — MIDAZOLAM HCL 5 MG/5ML IJ SOLN
INTRAMUSCULAR | Status: DC | PRN
  Administered 2013-08-30: 4 mg via INTRAVENOUS

## 2013-08-30 MED ORDER — PROPOFOL 10 MG/ML IV BOLUS
INTRAVENOUS | Status: DC | PRN
Start: 2013-08-30 — End: 2013-08-30
  Administered 2013-08-30: 130 mg via INTRAVENOUS

## 2013-08-30 MED ORDER — FENTANYL CITRATE 0.05 MG/ML IJ SOLN
50.0000 ug | INTRAMUSCULAR | Status: DC | PRN
  Administered 2013-09-01 – 2013-09-02 (×3): 100 ug via INTRAVENOUS
  Filled 2013-08-30 (×3): qty 2

## 2013-08-30 MED ORDER — BIOTENE DRY MOUTH MT LIQD
15.0000 mL | Freq: Four times a day (QID) | OROMUCOSAL | Status: DC
  Administered 2013-08-31 – 2013-09-13 (×27): 15 mL via OROMUCOSAL

## 2013-08-30 MED ORDER — ONDANSETRON HCL 4 MG/2ML IJ SOLN
INTRAMUSCULAR | Status: AC
  Filled 2013-08-30: qty 2

## 2013-08-30 MED ORDER — PIPERACILLIN-TAZOBACTAM 3.375 G IVPB 30 MIN
3.3750 g | Freq: Once | INTRAVENOUS | Status: AC
  Administered 2013-08-30: 3.375 g via INTRAVENOUS
  Filled 2013-08-30: qty 50

## 2013-08-30 MED ORDER — ACETAMINOPHEN 325 MG PO TABS
650.0000 mg | ORAL_TABLET | Freq: Four times a day (QID) | ORAL | Status: DC | PRN
  Filled 2013-08-30 (×2): qty 2

## 2013-08-30 MED ORDER — INSULIN REGULAR HUMAN 100 UNIT/ML IJ SOLN
INTRAMUSCULAR | Status: DC
  Administered 2013-08-30: 13.1 [IU]/h via INTRAVENOUS
  Filled 2013-08-30 (×2): qty 1

## 2013-08-30 SURGICAL SUPPLY — 38 items
BANDAGE GAUZE ELAST BULKY 4 IN (GAUZE/BANDAGES/DRESSINGS) ×9 IMPLANT
CANISTER SUCTION 2500CC (MISCELLANEOUS) ×3 IMPLANT
COVER SURGICAL LIGHT HANDLE (MISCELLANEOUS) ×3 IMPLANT
DRAPE LAPAROSCOPIC ABDOMINAL (DRAPES) ×3 IMPLANT
DRAPE PED LAPAROTOMY (DRAPES) IMPLANT
DRAPE UTILITY 15X26 W/TAPE STR (DRAPE) IMPLANT
DRSG PAD ABDOMINAL 8X10 ST (GAUZE/BANDAGES/DRESSINGS) IMPLANT
ELECT CAUTERY BLADE 6.4 (BLADE) ×3 IMPLANT
ELECT REM PT RETURN 9FT ADLT (ELECTROSURGICAL) ×3
ELECTRODE REM PT RTRN 9FT ADLT (ELECTROSURGICAL) ×1 IMPLANT
GLOVE BIO SURGEON STRL SZ 6.5 (GLOVE) ×8 IMPLANT
GLOVE BIO SURGEON STRL SZ7.5 (GLOVE) ×3 IMPLANT
GLOVE BIO SURGEONS STRL SZ 6.5 (GLOVE) ×4
GLOVE BIOGEL PI IND STRL 6.5 (GLOVE) ×2 IMPLANT
GLOVE BIOGEL PI IND STRL 7.0 (GLOVE) ×2 IMPLANT
GLOVE BIOGEL PI IND STRL 8 (GLOVE) ×1 IMPLANT
GLOVE BIOGEL PI INDICATOR 6.5 (GLOVE) ×4
GLOVE BIOGEL PI INDICATOR 7.0 (GLOVE) ×4
GLOVE BIOGEL PI INDICATOR 8 (GLOVE) ×2
GOWN STRL REUS W/ TWL LRG LVL3 (GOWN DISPOSABLE) ×3 IMPLANT
GOWN STRL REUS W/ TWL XL LVL3 (GOWN DISPOSABLE) ×1 IMPLANT
GOWN STRL REUS W/TWL LRG LVL3 (GOWN DISPOSABLE) ×6
GOWN STRL REUS W/TWL XL LVL3 (GOWN DISPOSABLE) ×2
KIT BASIN OR (CUSTOM PROCEDURE TRAY) ×3 IMPLANT
KIT ROOM TURNOVER OR (KITS) ×3 IMPLANT
MARKER SKIN DUAL TIP RULER LAB (MISCELLANEOUS) ×3 IMPLANT
NS IRRIG 1000ML POUR BTL (IV SOLUTION) ×3 IMPLANT
PACK GENERAL/GYN (CUSTOM PROCEDURE TRAY) ×3 IMPLANT
PAD ABD 8X10 STRL (GAUZE/BANDAGES/DRESSINGS) ×9 IMPLANT
PAD ARMBOARD 7.5X6 YLW CONV (MISCELLANEOUS) ×3 IMPLANT
SOLUTION BETADINE 4OZ (MISCELLANEOUS) ×12 IMPLANT
SPONGE GAUZE 4X4 12PLY (GAUZE/BANDAGES/DRESSINGS) IMPLANT
SPONGE LAP 18X18 X RAY DECT (DISPOSABLE) ×3 IMPLANT
SUT SILK 2 0SH CR/8 30 (SUTURE) ×6 IMPLANT
SWAB COLLECTION DEVICE MRSA (MISCELLANEOUS) IMPLANT
TOWEL OR 17X24 6PK STRL BLUE (TOWEL DISPOSABLE) ×3 IMPLANT
TOWEL OR 17X26 10 PK STRL BLUE (TOWEL DISPOSABLE) ×3 IMPLANT
TUBE ANAEROBIC SPECIMEN COL (MISCELLANEOUS) ×3 IMPLANT

## 2013-08-30 NOTE — Brief Op Note (Signed)
08/30/2013  6:26 PM  PATIENT:  [name]  38 y.o. female  PRE-OPERATIVE DIAGNOSIS:  Left Leg Abscess  POST-OPERATIVE DIAGNOSIS:  Left Leg Abscess  PROCEDURE:  Procedure(s): IRRIGATION AND DEBRIDEMENT ABSCESS Left Thigh Necrotic Wound (Left)  SURGEON:  Surgeon(s) and Role:    * Axel FillerArmando Coolidge Gossard, MD - Primary  PHYSICIAN ASSISTANT:   ASSISTANTS: none   ANESTHESIA:   general  EBL:  Total I/O In: 1000 [I.V.:1000] Out: -   BLOOD ADMINISTERED:none  DRAINS: wound packed with iodoform soaked kerlix x 3   LOCAL MEDICATIONS USED:  NONE  SPECIMEN:  Source of Specimen:  L LE  DISPOSITION OF SPECIMEN:  micro  COUNTS:  YES  TOURNIQUET:  * No tourniquets in log *  DICTATION: .Dragon Dictation  The patient was consented she was taken back to the operating room placed in supine position bilateral SCDs in place.  The patient was prepped and draped in the usual sterile fashion. After appropriate were confirmed a timeout was called and all facts were verified.  The preoperative was measured and was measured out to be a 15 x 10centimeter size for  A 10 blade was used to incise the wound circumferentially. Electrocautery was used to maintain hemostasis in the wound debrided. There was a pocket of purulence that was entered, both aerobic and anaerobic cultures were obtained in this area. I proceeded to dissect away the surrounding necrotic tissue which approached the level of the fascia however did not go deep to the fascia.  The saphenous vein was ligated in 2 separate areas.  Once the wound was debrided appropriately to healthy fat, the area was checked for hemostasis and was achieved using electrocautery.  The final measurement of the wound was 30 x 15 x 10 cm. Area was packed with iodoform soaked gauze x3. It was dressed with ABDs pad and tape. The patient was taken to the ICU in guarded condition.   2.  Tool used for debridement (curette, scapel, etc.)  Scalpel and  electrocautery  3.  Frequency of surgical debridement.   initial  4.  Measurement of total devitalized tissue (wound surface) before and after surgical debridement.   preoperatively-15 x 10 cm, postoperatively 30 x 15 x 10 cm  5.  Area and depth of devitalized tissue removed from wound.  30 x 15 x 10 cm  6.  Blood loss and description of tissue removed.  100 cc  7.  Evidence of the progress of the wound's response to treatment.  A.  Current wound volume (current dimensions and depth).  30 x 15 x 10 cm  B.  Presence (and extent of) of infection.  Subcutaneous space of the left lower extremity  C.  Presence (and extent of) of non viable tissue.  Subcutaneous space of the left lower extremity  D.  Other material in the wound that is expected to inhibit healing.  none  8.  Was there any viable tissue removed (measurements): no    PLAN OF CARE: Admit to inpatient   PATIENT DISPOSITION:  PACU - hemodynamically stable.   Delay start of Pharmacological VTE agent (>24hrs) due to surgical blood loss or risk of bleeding: not applicable

## 2013-08-30 NOTE — Brief Op Note (Signed)
08/30/2013  6:19 PM  PATIENT:  [name]  38 y.o. female  PRE-OPERATIVE DIAGNOSIS:  Left Leg Abscess  POST-OPERATIVE DIAGNOSIS:  Left Leg Abscess  PROCEDURE:  Procedure(s): IRRIGATION AND DEBRIDEMENT ABSCESS Left Thigh Necrotic Wound (Left)  SURGEON:  Surgeon(s) and Role:    * Axel FillerArmando Nathen Balaban, MD - Primary  PHYSICIAN ASSISTANT:   ASSISTANTS: none   ANESTHESIA:   general  EBL:  Total I/O In: 1000 [I.V.:1000] Out: -   BLOOD ADMINISTERED:none  DRAINS: wound packed with iodoform soaked kerlix x 3   LOCAL MEDICATIONS USED:  NONE  SPECIMEN:  Source of Specimen:  L LE  DISPOSITION OF SPECIMEN:  micro  COUNTS:  YES  TOURNIQUET:  * No tourniquets in log *  DICTATION: .Dragon Dictation  The patient was consented she was taken back to the operating room placed in supine position bilateral SCDs in place.  The patient was prepped and draped in the usual sterile fashion. After appropriate were confirmed a timeout was called and all facts were verified.  The preoperative was measured and was measured out to be a 15 x 10centimeter size for  A 10 blade was used to incise the wound circumferentially. Electrocautery was used to maintain hemostasis in the wound debrided. There was a pocket of purulence that was entered, both aerobic and anaerobic cultures were obtained in this area. I proceeded to dissect away the surrounding necrotic tissue which approached the level of the fascia however did not go deep to the fascia.  The saphenous vein was ligated in 2 separate areas.  Once the wound was debrided appropriately to healthy fat, the area was checked for hemostasis and was achieved using electrocautery.  The final measurement of the wound was 30 x 15 x 10 cm. Area was packed with iodoform soaked gauze x3. It was dressed with ABDs pad and tape. The patient was taken to the ICU in guarded condition.   2.  Tool used for debridement (curette, scapel, etc.)  Scalpel and  electrocautery  3.  Frequency of surgical debridement.   initial  4.  Measurement of total devitalized tissue (wound surface) before and after surgical debridement.   preoperatively-15 x 10 cm, postoperatively 30 x 15 x 10 cm  5.  Area and depth of devitalized tissue removed from wound.  30 x 15 x 10 cm  6.  Blood loss and description of tissue removed.  100 cc  7.  Evidence of the progress of the wound's response to treatment.  A.  Current wound volume (current dimensions and depth).  30 x 15 x 10 cm  B.  Presence (and extent of) of infection.  Subcutaneous space of the left lower extremity  C.  Presence (and extent of) of non viable tissue.  Subcutaneous space of the left lower extremity  D.  Other material in the wound that is expected to inhibit healing.  none  8.  Was there any viable tissue removed (measurements): no    PLAN OF CARE: Admit to inpatient   PATIENT DISPOSITION:  PACU - hemodynamically stable.   Delay start of Pharmacological VTE agent (>24hrs) due to surgical blood loss or risk of bleeding: not applicable

## 2013-08-30 NOTE — Anesthesia Procedure Notes (Signed)
Procedure Name: Intubation Date/Time: 08/30/2013 5:36 PM Performed by: Reine JustFLOWERS, Estuardo Frisbee T Pre-anesthesia Checklist: Patient identified, Emergency Drugs available, Suction available, Patient being monitored and Timeout performed Patient Re-evaluated:Patient Re-evaluated prior to inductionOxygen Delivery Method: Circle system utilized and Simple face mask Preoxygenation: Pre-oxygenation with 100% oxygen Intubation Type: IV induction Ventilation: Mask ventilation without difficulty and Oral airway inserted - appropriate to patient size Laryngoscope Size: Hyacinth MeekerMiller and 3 Grade View: Grade I Tube type: Oral Tube size: 7.5 mm Number of attempts: 1 Airway Equipment and Method: Patient positioned with wedge pillow and Stylet Placement Confirmation: ETT inserted through vocal cords under direct vision,  positive ETCO2 and breath sounds checked- equal and bilateral Secured at: 23 cm Tube secured with: Tape Dental Injury: Teeth and Oropharynx as per pre-operative assessment

## 2013-08-30 NOTE — Consult Note (Signed)
Reason for Consult: fournier gangrene left thigh Referring Physician: Dr. Marena ChancyWilliam Walden   HPI: Linda HarmsDavenia [name] is a 38 year old female with a history of diabetes mellitus, hypertension, asthma, CVA and obesity who [facility] with left groin "boil" which started 1 week ago Monday.  Associated with fevers of 102 Wednesday, malaise, lethargy, poor appetite and pain.  Location is left groin.  No modifying factors.  No aggravating or alleviating factors.  Time pattern is constant.  Severe in severity. Coarse is worsening.  She reports CBGs have been 200-300s.  She is lethargic, but arousable and able to answer all questions.  She has been NPO since yesterday morning.  She has not sought medical attention due to having to take her mother to dialysis, however, husband reports she has not been out of bed much since last Tuesday.  Her work up shows a white count of 24K.  Lactic acid of 3.6.  sCr 1.85 and BUN 48.  Glucose 430.  She has been started on vanc and zosyn.  Her vital signs are stable, no tachycardia or hypotension.    Past Medical History  Diagnosis Date  . Morbid obesity   . Hypertension   . Diabetes mellitus without complication     Past Surgical History  Procedure Laterality Date  . No past surgeries      Family History  Problem Relation Age of Onset  . Diabetes Mother     Social History:  reports that she has never smoked. She has never used smokeless tobacco. She reports that she does not drink alcohol or use illicit drugs.  Allergies:  Allergies  Allergen Reactions  . Azithromycin Other (See Comments)    Nose bleeding event    Medications: Current facility-administered medications:acetaminophen (TYLENOL) tablet 650 mg, 650 mg, Oral, Q6H PRN, Dagmar HaitWilliam Blair Walden, MD;  piperacillin-tazobactam (ZOSYN) IVPB 3.375 g, 3.375 g, Intravenous, Once, Dagmar HaitWilliam Blair Walden, MD;  sodium chloride 0.9 % bolus 1,000 mL, 1,000 mL, Intravenous, Once, Shanna CiscoMegan E Docherty, MD;  sodium chloride  0.9 % bolus 1,000 mL, 1,000 mL, Intravenous, Once, Dagmar HaitWilliam Blair Walden, MD vancomycin (VANCOCIN) IVPB 1000 mg/200 mL premix, 1,000 mg, Intravenous, Once, Dagmar HaitWilliam Blair Walden, MD Current outpatient prescriptions:albuterol (PROVENTIL HFA;VENTOLIN HFA) 108 (90 BASE) MCG/ACT inhaler, Inhale 2 puffs into the lungs every 4 (four) hours as needed for wheezing or shortness of breath., Disp: 1 Inhaler, Rfl: 2;  beclomethasone (QVAR) 80 MCG/ACT inhaler, Inhale 2 puffs into the lungs 2 (two) times daily as needed (for wheezing)., Disp: , Rfl:  metFORMIN (GLUCOPHAGE) 500 MG tablet, Take 1 tablet (500 mg total) by mouth 2 (two) times daily with a meal., Disp: 60 tablet, Rfl: 3;  triamterene-hydrochlorothiazide (MAXZIDE) 75-50 MG per tablet, Take 2 tablets by mouth daily. , Disp: , Rfl:  Results for orders placed during the hospital encounter of 08/30/13 (from the past 48 hour(s))  CBC WITH DIFFERENTIAL     Status: Abnormal (Preliminary result)   Collection Time    08/30/13  2:29 PM      Result Value Ref Range   WBC 24.0 (*) 4.0 - 10.5 K/uL   RBC 4.29  3.87 - 5.11 MIL/uL   Hemoglobin 11.6 (*) 12.0 - 15.0 g/dL   HCT 16.133.6 (*) 09.636.0 - 04.546.0 %   MCV 78.3  78.0 - 100.0 fL   MCH 27.0  26.0 - 34.0 pg   MCHC 34.5  30.0 - 36.0 g/dL   RDW 40.913.3  81.111.5 - 91.415.5 %   Platelets  PENDING  150 - 400 K/uL   Neutrophils Relative % PENDING  43 - 77 %   Neutro Abs PENDING  1.7 - 7.7 K/uL   Band Neutrophils PENDING  0 - 10 %   Lymphocytes Relative PENDING  12 - 46 %   Lymphs Abs PENDING  0.7 - 4.0 K/uL   Monocytes Relative PENDING  3 - 12 %   Monocytes Absolute PENDING  0.1 - 1.0 K/uL   Eosinophils Relative PENDING  0 - 5 %   Eosinophils Absolute PENDING  0.0 - 0.7 K/uL   Basophils Relative PENDING  0 - 1 %   Basophils Absolute PENDING  0.0 - 0.1 K/uL   WBC Morphology PENDING     RBC Morphology PENDING     Smear Review PENDING     nRBC PENDING  0 /100 WBC   Metamyelocytes Relative PENDING     Myelocytes PENDING      Promyelocytes Absolute PENDING     Blasts PENDING    I-STAT CG4 LACTIC ACID, ED     Status: Abnormal   Collection Time    08/30/13  2:50 PM      Result Value Ref Range   Lactic Acid, Venous 3.60 (*) 0.5 - 2.2 mmol/L    No results found.  Review of Systems  Constitutional: Positive for fever, chills and malaise/fatigue. Negative for weight loss.  HENT: Negative.   Eyes: Negative.   Respiratory: Negative.   Cardiovascular: Negative.  Negative for chest pain, palpitations and PND.  Gastrointestinal: Positive for heartburn. Negative for nausea, vomiting, abdominal pain, blood in stool and melena.  Genitourinary: Negative.   Musculoskeletal: Negative.   Neurological: Negative.   Psychiatric/Behavioral: Negative.    Blood pressure 120/73, pulse 120, temperature 100.3 F (37.9 C), temperature source Oral, resp. rate 24, SpO2 99.00%, not currently breastfeeding. Physical Exam  Constitutional: She is oriented to person, place, and time. She appears well-developed and well-nourished. No distress.  HENT:  Head: Normocephalic.  Eyes: Conjunctivae and EOM are normal. Pupils are equal, round, and reactive to light.  Neck: Normal range of motion. Neck supple. Thyromegaly present.  Cardiovascular: Normal rate, regular rhythm, normal heart sounds and intact distal pulses.  Exam reveals no gallop and no friction rub.   No murmur heard. tachycardic  Respiratory: Effort normal and breath sounds normal. No respiratory distress. She has no wheezes. She has no rales. She exhibits no tenderness.  GI: Soft. Bowel sounds are normal. She exhibits no distension and no mass. There is no tenderness. There is no rebound and no guarding.  Musculoskeletal: Normal range of motion. She exhibits no edema and no tenderness.  Lymphadenopathy:    She has no cervical adenopathy.  Neurological: She is alert and oriented to person, place, and time.  Skin: Skin is warm and dry. She is not diaphoretic.  Foul smelling,  erythema  Psychiatric: Judgment and thought content normal.      Assessment/Plan: Hyperglycemia(CBG 430) Fournier gangrene of left thigh Leukocytosis   AKI Dehydration Hyponatremia  -Recommend medicine admission for management of DM, dehydration, electrolyte imbalance -NPO in prep for OR for debridement of left groin infection -IVF -Vanc/zosyn   Starlynn Klinkner ANP-BC 08/30/2013, 3:30 PM

## 2013-08-30 NOTE — ED Notes (Signed)
Pt has wound to left groin area. Per family started off as a boil and progressed. sts very lethargic. Pt has fever and very fouls smell coming from area.

## 2013-08-30 NOTE — Consult Note (Signed)
I have seen and examined the pt and agree with NP-Riebock's progress note. LLE skin infection with ischemic superficial wound To OR for debridement

## 2013-08-30 NOTE — H&P (Signed)
PULMONARY / CRITICAL CARE MEDICINE   Name: [name] MRN: [id number] DOB: March 19, 1975    ADMISSION DATE:  08/30/2013  REFERRING MD :  Derrell Lolling  CHIEF COMPLAINT:  Leg wound  INITIAL PRESENTATION:  38 yo female developed boil in Lt thigh one week ago.  Presented with fever (Tm 102F), lethargy.  Found to have Lt leg abscess complicated by sepsis, hyperglycemia.  Was take to OR for I&D and remained on vent post op.  She has hx of DM, HTN, asthma.  STUDIES:   SIGNIFICANT EVENTS: 7/20 Admit, to OR  HISTORY OF PRESENT ILLNESS:  38 yo female developed boil in Lt thigh one week ago.  Presented with fever (Tm 102F), lethargy.  Found to have Lt leg abscess complicated by sepsis, hyperglycemia.  Was take to OR for I&D and remained on vent post op.  She has hx of DM, HTN, asthma.  PAST MEDICAL HISTORY :  Past Medical History  Diagnosis Date  . Morbid obesity   . Hypertension   . Diabetes mellitus without complication   . CVA (cerebral infarction)    Past Surgical History  Procedure Laterality Date  . No past surgeries     Prior to Admission medications   Medication Sig Start Date End Date Taking? Authorizing Provider  albuterol (PROVENTIL HFA;VENTOLIN HFA) 108 (90 BASE) MCG/ACT inhaler Inhale 2 puffs into the lungs every 4 (four) hours as needed for wheezing or shortness of breath. 02/21/13  Yes Arthor Captain, PA-C  beclomethasone (QVAR) 80 MCG/ACT inhaler Inhale 2 puffs into the lungs 2 (two) times daily as needed (for wheezing).   Yes Historical Provider, MD  metFORMIN (GLUCOPHAGE) 500 MG tablet Take 1 tablet (500 mg total) by mouth 2 (two) times daily with a meal. 02/21/13  Yes Arthor Captain, PA-C  triamterene-hydrochlorothiazide (MAXZIDE) 75-50 MG per tablet Take 2 tablets by mouth daily.    Yes Historical Provider, MD   Allergies  Allergen Reactions  . Azithromycin Other (See Comments)    Nose bleeding event    FAMILY HISTORY:  Family History  Problem Relation Age of Onset   . Diabetes Mother   . Kidney disease Mother    SOCIAL HISTORY:  reports that she has never smoked. She has never used smokeless tobacco. She reports that she does not drink alcohol or use illicit drugs.  REVIEW OF SYSTEMS:   Unable to obtain  SUBJECTIVE:   VITAL SIGNS: Temp:  [99.6 F (37.6 C)-100.3 F (37.9 C)] 99.6 F (37.6 C) (07/20 1900) Pulse Rate:  [105-120] 105 (07/20 1900) Resp:  [7-31] 7 (07/20 1900) BP: (120-142)/(73-87) 142/75 mmHg (07/20 1900) SpO2:  [94 %-99 %] 94 % (07/20 1900) FiO2 (%):  [40 %] 40 % (07/20 1900) Weight:  [298 lb 11.6 oz (135.5 kg)] 298 lb 11.6 oz (135.5 kg) (07/20 2016) HEMODYNAMICS:   VENTILATOR SETTINGS: Vent Mode:  [-] PRVC FiO2 (%):  [40 %] 40 % Set Rate:  [16 bmp] 16 bmp Vt Set:  [550 mL] 550 mL PEEP:  [5 cmH20] 5 cmH20 Plateau Pressure:  [23 cmH20] 23 cmH20 INTAKE / OUTPUT: Intake/Output     07/20 0701 - 07/21 0700   I.V. (mL/kg) 2261.1 (16.7)   Total Intake(mL/kg) 2261.1 (16.7)   Blood 200   Total Output 200   Net +2061.1         PHYSICAL EXAMINATION: General:  Ill appearing Neuro:  RASS -3 HEENT:  Pupils reactive, ETT in place Cardiovascular:  Regular, tachycardic Lungs:  Scattered rhonchi Abdomen:  Soft, non tender, + bowel sounds Musculoskeletal:  1+ edema Skin:  Wound dressing Lt thigh with serosanguineous drainage.  LABS:  CBC  Recent Labs Lab 08/30/13 1429  WBC 24.0*  HGB 11.6*  HCT 33.6*  PLT 173   Coag's No results found for this basename: APTT, INR,  in the last 168 hours  BMET  Recent Labs Lab 08/30/13 1429  NA 125*  K 4.6  CL 85*  CO2 17*  BUN 48*  CREATININE 1.85*  GLUCOSE 430*   Electrolytes  Recent Labs Lab 08/30/13 1429  CALCIUM 8.5   Sepsis Markers  Recent Labs Lab 08/30/13 1450  LATICACIDVEN 3.60*   ABG No results found for this basename: PHART, PCO2ART, PO2ART,  in the last 168 hours  Liver Enzymes  Recent Labs Lab 08/30/13 1429  AST 36  ALT 20  ALKPHOS  140*  BILITOT 0.4  ALBUMIN 1.7*   Glucose  Recent Labs Lab 08/30/13 1629  GLUCAP 493*    Imaging No results found.   ASSESSMENT / PLAN:  PULMONARY [place] 7/20 >> A: Acute respiratory failure 2nd to sepsis. Hx of asthma. P:   Full vent support until more stable F/u CXR and ABG Pulmicort, brovana with prn albuterol  CARDIOVASCULAR A:  Sepsis. Hx of HTN. P:  Continue IV fluids >> change to normal saline Monitor hemodynamics  RENAL A:   Lactic acidosis Acute kidney injury (Baseline creatinine 0.74 from 02/21/13) Hyponatremia. P:   Optimize hemodynamics F/u lactic acid Monitor renal fx, urine outpt, electrolytes  GASTROINTESTINAL A:   Nutrition. P:   NPO Tube feeds if unable to extubate soon Protonix for SUP  HEMATOLOGIC A:   Leukocytosis. P:  F/u CBC SQ heparin for DVT prevention  INFECTIOUS A:   Lt thigh abscess with sepsis. P:   bcx2 7/20 >> uc 7/20 >> Abx: Vancomycin, start date7/20, day 0/x Abx: Zosyn, start date7/20, day 0/x Post-op care per [facility]  ENDOCRINE A:   DM type II with hyperglycemia. P:   ICU hyperglycemia protocol with insulin gtt for now  NEUROLOGIC A:   Acute encephalopathy 2nd to sepsis. P:   RASS goal: -1 PAD 3 until more stable  TODAY'S SUMMARY:  38 yo female with Lt thigh abscess complicated by sepsis, AKI, hyperglycemia.  She has hx of asthma.  She remains on vent post-op.  Updated pt's family at bedside.  CC time 50 minutes.  Coralyn HellingVineet Nikola Blackston, MD [facility] 08/30/2013, 8:28 PM Pager:  [phone] After 3pm call: [phone]

## 2013-08-30 NOTE — Progress Notes (Signed)
Pt. Received from OR with CRNA transporting. Pt. Is intubated, with 2 peripheral IV's, she is unresponsive upon arrival.She is febrile( 99.6) and on an insulin gtt. CBG=415. Insulin gtt adjusted per glucomander to 14.2 units/hr.There are no orders for sedation. NS is infusing at 125/hr per anesthesia. Dr. Delford FieldWright called at e-link. He will camera in and look at the patient. We have told him we need some orders for sedation because the patient is beginning to wake up and there are no orders for sedation. The patient is hemodynamically stable at present.Dr. Delford FieldWright was given all pertinent labs( Na=125 @ 1400 today.) Dr. Craige CottaSood in to see patient. Report given to incoming shift.

## 2013-08-30 NOTE — Anesthesia Postprocedure Evaluation (Signed)
  Anesthesia Post-op Note  Patient: [name]  Procedure(s) Performed: Procedure(s): IRRIGATION AND DEBRIDEMENT ABSCESS Left Thigh Necrotic Wound (Left)  Patient Location: PACU and ICU  Anesthesia Type:General  Level of Consciousness: sedated  Airway and Oxygen Therapy: Patient remains intubated per anesthesia plan  Post-op Pain: mild  Post-op Assessment: Post-op Vital signs reviewed  Post-op Vital Signs: Reviewed  Last Vitals:  Filed Vitals:   08/30/13 1601  BP:   Pulse:   Temp: 37.6 C  Resp:     Complications: No apparent anesthesia complications

## 2013-08-30 NOTE — Progress Notes (Signed)
UR Completed.  Natasha Burda Jane [phone] 08/30/2013  

## 2013-08-30 NOTE — Op Note (Signed)
08/30/2013  6:19 PM  PATIENT:  [name]  37 y.o. female  PRE-OPERATIVE DIAGNOSIS:  Left Leg Abscess  POST-OPERATIVE DIAGNOSIS:  Left Leg Abscess  PROCEDURE:  Procedure(s): IRRIGATION AND DEBRIDEMENT ABSCESS Left Thigh Necrotic Wound (Left)  SURGEON:  Surgeon(s) and Role:    * Hadas Jessop, MD - Primary  PHYSICIAN ASSISTANT:   ASSISTANTS: none   ANESTHESIA:   general  EBL:  Total I/O In: 1000 [I.V.:1000] Out: -   BLOOD ADMINISTERED:none  DRAINS: wound packed with iodoform soaked kerlix x 3   LOCAL MEDICATIONS USED:  NONE  SPECIMEN:  Source of Specimen:  L LE  DISPOSITION OF SPECIMEN:  micro  COUNTS:  YES  TOURNIQUET:  * No tourniquets in log *  DICTATION: .Dragon Dictation  The patient was consented she was taken back to the operating room placed in supine position bilateral SCDs in place.  The patient was prepped and draped in the usual sterile fashion. After appropriate were confirmed a timeout was called and all facts were verified.  The preoperative was measured and was measured out to be a 15 x 10centimeter size for  A 10 blade was used to incise the wound circumferentially. Electrocautery was used to maintain hemostasis in the wound debrided. There was a pocket of purulence that was entered, both aerobic and anaerobic cultures were obtained in this area. I proceeded to dissect away the surrounding necrotic tissue which approached the level of the fascia however did not go deep to the fascia.  The saphenous vein was ligated in 2 separate areas.  Once the wound was debrided appropriately to healthy fat, the area was checked for hemostasis and was achieved using electrocautery.  The final measurement of the wound was 30 x 15 x 10 cm. Area was packed with iodoform soaked gauze x3. It was dressed with ABDs pad and tape. The patient was taken to the ICU in guarded condition.   2.  Tool used for debridement (curette, scapel, etc.)  Scalpel and  electrocautery  3.  Frequency of surgical debridement.   initial  4.  Measurement of total devitalized tissue (wound surface) before and after surgical debridement.   preoperatively-15 x 10 cm, postoperatively 30 x 15 x 10 cm  5.  Area and depth of devitalized tissue removed from wound.  30 x 15 x 10 cm  6.  Blood loss and description of tissue removed.  100 cc  7.  Evidence of the progress of the wound's response to treatment.  A.  Current wound volume (current dimensions and depth).  30 x 15 x 10 cm  B.  Presence (and extent of) of infection.  Subcutaneous space of the left lower extremity  C.  Presence (and extent of) of non viable tissue.  Subcutaneous space of the left lower extremity  D.  Other material in the wound that is expected to inhibit healing.  none  8.  Was there any viable tissue removed (measurements): no    PLAN OF CARE: Admit to inpatient   PATIENT DISPOSITION:  PACU - hemodynamically stable.   Delay start of Pharmacological VTE agent (>24hrs) due to surgical blood loss or risk of bleeding: not applicable  

## 2013-08-30 NOTE — Progress Notes (Signed)
eLink Physician-Brief Progress Note Patient Name: Linda HarmsDavenia [name] DOB: 06/30/1975 MRN: [id number]  Date of Service  08/30/2013   HPI/Events of Note   Pt on vent postop .    eICU Interventions  Orders written full pccm note to follow   Intervention Category Major Interventions: Respiratory failure - evaluation and management  Shan Levansatrick Patte Winkel 08/30/2013, 7:24 PM

## 2013-08-30 NOTE — Transfer of Care (Signed)
Immediate Anesthesia Transfer of Care Note  Patient: [name]  Procedure(s) Performed: Procedure(s): IRRIGATION AND DEBRIDEMENT ABSCESS Left Thigh Necrotic Wound (Left)  Patient Location: PACU and ICU  Anesthesia Type:General  Level of Consciousness: Patient remains intubated per anesthesia plan  Airway & Oxygen Therapy: Patient remains intubated per anesthesia plan and Patient placed on Ventilator (see vital sign flow sheet for setting)  Post-op Assessment: Report given to PACU RN and Post -op Vital signs reviewed and stable  Post vital signs: Reviewed and stable  Complications: No apparent anesthesia complications

## 2013-08-30 NOTE — Anesthesia Preprocedure Evaluation (Addendum)
Anesthesia Evaluation  Patient identified by MRN, date of birth, ID band Patient awake    Reviewed: Allergy & Precautions, H&P , NPO status , Patient's Chart, lab work & pertinent test results  History of Anesthesia Complications Negative for: history of anesthetic complications  Airway Mallampati: II TM Distance: >3 FB Neck ROM: Full    Dental  (+) Edentulous Upper, Edentulous Lower   Pulmonary asthma ,  breath sounds clear to auscultation  Pulmonary exam normal       Cardiovascular hypertension, Pt. on medications Rhythm:Regular Rate:Tachycardia     Neuro/Psych negative neurological ROS     GI/Hepatic negative GI ROS, Neg liver ROS,   Endo/Other  diabetes (glu 430), Oral Hypoglycemic AgentsMorbid obesity  Renal/GU Renal InsufficiencyRenal disease (creat 1.85)     Musculoskeletal   Abdominal (+) + obese,   Peds  Hematology   Anesthesia Other Findings   Reproductive/Obstetrics LMP 3 weeks ago                          Anesthesia Physical Anesthesia Plan  ASA: III and emergent  Anesthesia Plan: General   Post-op Pain Management:    Induction: Intravenous and Rapid sequence  Airway Management Planned: Oral ETT  Additional Equipment:   Intra-op Plan:   Post-operative Plan: Possible Post-op intubation/ventilation  Informed Consent: I have reviewed the patients History and Physical, chart, labs and discussed the procedure including the risks, benefits and alternatives for the proposed anesthesia with the patient or authorized representative who has indicated his/her understanding and acceptance.   Dental advisory given  Plan Discussed with: CRNA and Surgeon  Anesthesia Plan Comments: (Plan routine monitors, GETA)        Anesthesia Quick Evaluation

## 2013-08-30 NOTE — ED Provider Notes (Signed)
CSN: [id number]     Arrival date & time 08/30/13  1358 History   First MD Initiated Contact with Patient 08/30/13 1501     Chief Complaint  Patient presents with  . Wound Infection  . Fatigue  . Fever     (Consider location/radiation/quality/duration/timing/severity/associated sxs/prior Treatment) Patient is a 38 y.o. female presenting with fever and rash. The history is provided by the patient.  Fever Temp source:  Oral Severity:  Moderate Associated symptoms: rash   Associated symptoms: no diarrhea   Rash Location:  Ano-genital Ano-genital rash location:  Groin Quality: blistering, painful, peeling and weeping   Pain details:    Quality:  Aching   Severity:  Moderate   Onset quality:  Gradual   Duration:  1 week   Timing:  Constant   Progression:  Worsening Severity:  Severe Onset quality:  Gradual Duration:  1 week Timing:  Constant Progression:  Worsening Chronicity:  New Context: not eggs, not exposure to similar rash and not nuts   Context comment:  Started as a small boil in her groin, worsened over past week Relieved by:  Nothing Worsened by:  Nothing tried Associated symptoms: fever   Associated symptoms: no abdominal pain, no diarrhea, no fatigue and no URI   Associated symptoms comment:  Altered mental status   Past Medical History  Diagnosis Date  . Morbid obesity   . Hypertension   . Diabetes mellitus without complication    Past Surgical History  Procedure Laterality Date  . No past surgeries     Family History  Problem Relation Age of Onset  . Diabetes Mother    History  Substance Use Topics  . Smoking status: Never Smoker   . Smokeless tobacco: Never Used  . Alcohol Use: No   OB History   Grav Para Term Preterm Abortions TAB SAB Ect Mult Living   3 3 3  0 0 0 0 0 0 3     Review of Systems  Constitutional: Positive for fever. Negative for fatigue.  Gastrointestinal: Negative for abdominal pain and diarrhea.  Skin: Positive for  rash.  All other systems reviewed and are negative.     Allergies  Azithromycin  Home Medications   Prior to Admission medications   Medication Sig Start Date End Date Taking? Authorizing Provider  albuterol (PROVENTIL HFA;VENTOLIN HFA) 108 (90 BASE) MCG/ACT inhaler Inhale 2 puffs into the lungs every 4 (four) hours as needed for wheezing or shortness of breath. 02/21/13   Arthor CaptainAbigail Harris, PA-C  beclomethasone (QVAR) 80 MCG/ACT inhaler Inhale 2 puffs into the lungs 2 (two) times daily. 07/20/13   Reuben Likesavid C Keller, MD  cloNIDine (CATAPRES) 0.2 MG tablet Take 1 tablet (0.2 mg total) by mouth 2 (two) times daily. 07/20/13   Reuben Likesavid C Keller, MD  metFORMIN (GLUCOPHAGE) 500 MG tablet Take 1 tablet (500 mg total) by mouth 2 (two) times daily with a meal. 02/21/13   Arthor CaptainAbigail Harris, PA-C  triamterene-hydrochlorothiazide (MAXZIDE) 75-50 MG per tablet Take 2 tablets by mouth daily.     Historical Provider, MD   BP 120/73  Pulse 120  Temp(Src) 100.3 F (37.9 C) (Oral)  Resp 24  SpO2 99% Physical Exam  Nursing note and vitals reviewed. Constitutional: She appears well-developed and well-nourished. She appears lethargic. She appears distressed.  Morbidly obese  HENT:  Head: Normocephalic and atraumatic.  Eyes: EOM are normal. Pupils are equal, round, and reactive to light.  Neck: Normal range of motion. Neck supple.  Cardiovascular: Normal  rate and regular rhythm.  Exam reveals no friction rub.   No murmur heard. Pulmonary/Chest: Effort normal and breath sounds normal. No respiratory distress. She has no wheezes. She has no rales.  Abdominal: Soft. She exhibits no distension. There is no tenderness. There is no rebound.  Musculoskeletal: Normal range of motion. She exhibits no edema.  Neurological: She appears lethargic. No cranial nerve deficit or sensory deficit. She exhibits normal muscle tone. GCS eye subscore is 3. GCS verbal subscore is 5. GCS motor subscore is 6.  Skin: She is not diaphoretic.       Large L groin abcess with necrotic tissue sloughing in center. Crepitus in the wound diffusely.     ED Course  Procedures (including critical care time) Labs Review Labs Reviewed  CBC WITH DIFFERENTIAL - Abnormal; Notable for the following:    WBC 24.0 (*)    Hemoglobin 11.6 (*)    HCT 33.6 (*)    All other components within normal limits  I-STAT CG4 LACTIC ACID, ED - Abnormal; Notable for the following:    Lactic Acid, Venous 3.60 (*)    All other components within normal limits  CULTURE, BLOOD (ROUTINE X 2)  CULTURE, BLOOD (ROUTINE X 2)  URINE CULTURE  COMPREHENSIVE METABOLIC PANEL  POC URINE PREG, ED    Imaging Review No results found.   EKG Interpretation None     CRITICAL CARE Performed by: Dagmar Hait   Total critical care time: 30 minutes  Critical care time was exclusive of separately billable procedures and treating other patients.  Critical care was necessary to treat or prevent imminent or life-threatening deterioration.  Critical care was time spent personally by me on the following activities: development of treatment plan with patient and/or surrogate as well as nursing, discussions with consultants, evaluation of patient's response to treatment, examination of patient, obtaining history from patient or surrogate, ordering and performing treatments and interventions, ordering and review of laboratory studies, ordering and review of radiographic studies, pulse oximetry and re-evaluation of patient's condition.  MDM   Final diagnoses:  Thigh abscess  Acute respiratory failure with hypoxia  AKI (acute kidney injury)  Type 2 diabetes mellitus with hyperglycemia  Sepsis, due to unspecified organism    38 year old female with history of diabetes, hypertension presents with left groin abscess. Present for the past week, worsening. Associated fever, altered mental status. Patient here with tachycardia, fever. She is lethargic, but will answer  questions appropriately and follow commands. GCS of 14. Patient's large left thigh wound with diffuse crepitus in the wound. No crepitus outside of the wound. I believe patient is septic from her left groin wound. I will have surgery evaluate patient for possible necrotizing fasciitis due to the diffuse crepitus. Time frame is not consistent withclassic necrotizing fasciitis, as I would expect it to get worse much quicker, however with her having diabetes and necrotic center of the wound and diffuse crepitus, I am concerned. Vanc and Zosyn initiated. Fluids given for elevated lactate, sepsis. BMP shows concerns for DKA. CO2 17, anion gap 23. Glucostabilizer initiated. Surgery evaluated patient, deemed her fir for OR. Patient admitted by medicine.   Dagmar Hait, MD 08/30/13 2330

## 2013-08-31 ENCOUNTER — Inpatient Hospital Stay (HOSPITAL_COMMUNITY): Payer: Medicaid Other

## 2013-08-31 ENCOUNTER — Encounter (HOSPITAL_COMMUNITY): Payer: Self-pay | Admitting: General Surgery

## 2013-08-31 DIAGNOSIS — L03119 Cellulitis of unspecified part of limb: Secondary | ICD-10-CM

## 2013-08-31 DIAGNOSIS — E119 Type 2 diabetes mellitus without complications: Secondary | ICD-10-CM

## 2013-08-31 DIAGNOSIS — N179 Acute kidney failure, unspecified: Secondary | ICD-10-CM

## 2013-08-31 DIAGNOSIS — L02419 Cutaneous abscess of limb, unspecified: Secondary | ICD-10-CM

## 2013-08-31 DIAGNOSIS — A419 Sepsis, unspecified organism: Secondary | ICD-10-CM

## 2013-08-31 DIAGNOSIS — J9600 Acute respiratory failure, unspecified whether with hypoxia or hypercapnia: Secondary | ICD-10-CM

## 2013-08-31 LAB — COMPREHENSIVE METABOLIC PANEL
ALT: 19 U/L (ref 0–35)
ANION GAP: 16 — AB (ref 5–15)
AST: 27 U/L (ref 0–37)
Albumin: 1.5 g/dL — ABNORMAL LOW (ref 3.5–5.2)
Alkaline Phosphatase: 154 U/L — ABNORMAL HIGH (ref 39–117)
BILIRUBIN TOTAL: 0.2 mg/dL — AB (ref 0.3–1.2)
BUN: 50 mg/dL — AB (ref 6–23)
CALCIUM: 7.5 mg/dL — AB (ref 8.4–10.5)
CHLORIDE: 94 meq/L — AB (ref 96–112)
CO2: 21 mEq/L (ref 19–32)
CREATININE: 1.88 mg/dL — AB (ref 0.50–1.10)
GFR calc Af Amer: 39 mL/min — ABNORMAL LOW (ref >90)
GFR calc non Af Amer: 33 mL/min — ABNORMAL LOW (ref >90)
Glucose, Bld: 218 mg/dL — ABNORMAL HIGH (ref 70–99)
Potassium: 3.8 mEq/L (ref 3.7–5.3)
Sodium: 131 mEq/L — ABNORMAL LOW (ref 137–147)
TOTAL PROTEIN: 6.9 g/dL (ref 6.0–8.3)

## 2013-08-31 LAB — BASIC METABOLIC PANEL
ANION GAP: 13 (ref 5–15)
ANION GAP: 16 — AB (ref 5–15)
Anion gap: 18 — ABNORMAL HIGH (ref 5–15)
Anion gap: 11 (ref 5–15)
BUN: 45 mg/dL — AB (ref 6–23)
BUN: 45 mg/dL — AB (ref 6–23)
BUN: 43 mg/dL — ABNORMAL HIGH (ref 6–23)
BUN: 43 mg/dL — ABNORMAL HIGH (ref 6–23)
CHLORIDE: 95 meq/L — AB (ref 96–112)
CHLORIDE: 99 meq/L (ref 96–112)
CHLORIDE: 93 meq/L — AB (ref 96–112)
CHLORIDE: 100 meq/L (ref 96–112)
CO2: 21 meq/L (ref 19–32)
CO2: 21 meq/L (ref 19–32)
CO2: 21 meq/L (ref 19–32)
CO2: 20 meq/L (ref 19–32)
CREATININE: 1.5 mg/dL — AB (ref 0.50–1.10)
CREATININE: 1.35 mg/dL — AB (ref 0.50–1.10)
CREATININE: 1.41 mg/dL — AB (ref 0.50–1.10)
Calcium: 7.9 mg/dL — ABNORMAL LOW (ref 8.4–10.5)
Calcium: 7.7 mg/dL — ABNORMAL LOW (ref 8.4–10.5)
Calcium: 7.6 mg/dL — ABNORMAL LOW (ref 8.4–10.5)
Calcium: 7.4 mg/dL — ABNORMAL LOW (ref 8.4–10.5)
Creatinine, Ser: 1.59 mg/dL — ABNORMAL HIGH (ref 0.50–1.10)
GFR calc Af Amer: 47 mL/min — ABNORMAL LOW (ref >90)
GFR calc Af Amer: 51 mL/min — ABNORMAL LOW (ref >90)
GFR calc Af Amer: 58 mL/min — ABNORMAL LOW (ref >90)
GFR calc Af Amer: 55 mL/min — ABNORMAL LOW (ref >90)
GFR calc non Af Amer: 41 mL/min — ABNORMAL LOW (ref >90)
GFR calc non Af Amer: 44 mL/min — ABNORMAL LOW (ref >90)
GFR calc non Af Amer: 50 mL/min — ABNORMAL LOW (ref >90)
GFR calc non Af Amer: 47 mL/min — ABNORMAL LOW (ref >90)
Glucose, Bld: 184 mg/dL — ABNORMAL HIGH (ref 70–99)
Glucose, Bld: 208 mg/dL — ABNORMAL HIGH (ref 70–99)
Glucose, Bld: 328 mg/dL — ABNORMAL HIGH (ref 70–99)
Glucose, Bld: 297 mg/dL — ABNORMAL HIGH (ref 70–99)
POTASSIUM: 4.3 meq/L (ref 3.7–5.3)
POTASSIUM: 4.4 meq/L (ref 3.7–5.3)
Potassium: 4.1 mEq/L (ref 3.7–5.3)
Potassium: 4 mEq/L (ref 3.7–5.3)
Sodium: 134 mEq/L — ABNORMAL LOW (ref 137–147)
Sodium: 133 mEq/L — ABNORMAL LOW (ref 137–147)
Sodium: 130 mEq/L — ABNORMAL LOW (ref 137–147)
Sodium: 131 mEq/L — ABNORMAL LOW (ref 137–147)

## 2013-08-31 LAB — GLUCOSE, CAPILLARY
GLUCOSE-CAPILLARY: 126 mg/dL — AB (ref 70–99)
GLUCOSE-CAPILLARY: 168 mg/dL — AB (ref 70–99)
GLUCOSE-CAPILLARY: 188 mg/dL — AB (ref 70–99)
GLUCOSE-CAPILLARY: 230 mg/dL — AB (ref 70–99)
GLUCOSE-CAPILLARY: 271 mg/dL — AB (ref 70–99)
GLUCOSE-CAPILLARY: 288 mg/dL — AB (ref 70–99)
GLUCOSE-CAPILLARY: 331 mg/dL — AB (ref 70–99)
Glucose-Capillary: 102 mg/dL — ABNORMAL HIGH (ref 70–99)
Glucose-Capillary: 124 mg/dL — ABNORMAL HIGH (ref 70–99)
Glucose-Capillary: 143 mg/dL — ABNORMAL HIGH (ref 70–99)
Glucose-Capillary: 262 mg/dL — ABNORMAL HIGH (ref 70–99)
Glucose-Capillary: 327 mg/dL — ABNORMAL HIGH (ref 70–99)
Glucose-Capillary: 327 mg/dL — ABNORMAL HIGH (ref 70–99)
Glucose-Capillary: 346 mg/dL — ABNORMAL HIGH (ref 70–99)
Glucose-Capillary: 415 mg/dL — ABNORMAL HIGH (ref 70–99)
Glucose-Capillary: 94 mg/dL (ref 70–99)
Glucose-Capillary: 172 mg/dL — ABNORMAL HIGH (ref 70–99)
Glucose-Capillary: 160 mg/dL — ABNORMAL HIGH (ref 70–99)

## 2013-08-31 LAB — CBC
HCT: 29.3 % — ABNORMAL LOW (ref 36.0–46.0)
HEMOGLOBIN: 9.9 g/dL — AB (ref 12.0–15.0)
MCH: 26.6 pg (ref 26.0–34.0)
MCHC: 33.8 g/dL (ref 30.0–36.0)
MCV: 78.8 fL (ref 78.0–100.0)
Platelets: 185 10*3/uL (ref 150–400)
RBC: 3.72 MIL/uL — AB (ref 3.87–5.11)
RDW: 13.7 % (ref 11.5–15.5)
WBC: 21.3 10*3/uL — ABNORMAL HIGH (ref 4.0–10.5)

## 2013-08-31 LAB — HEMOGLOBIN A1C
HEMOGLOBIN A1C: 15.4 % — AB (ref <5.7)
Mean Plasma Glucose: 395 mg/dL — ABNORMAL HIGH (ref <117)

## 2013-08-31 LAB — POCT I-STAT 3, ART BLOOD GAS (G3+)
Acid-base deficit: 1 mmol/L (ref 0.0–2.0)
Bicarbonate: 22 mEq/L (ref 20.0–24.0)
O2 Saturation: 99 %
PCO2 ART: 30.4 mmHg — AB (ref 35.0–45.0)
PH ART: 7.468 — AB (ref 7.350–7.450)
Patient temperature: 98.6
TCO2: 23 mmol/L (ref 0–100)
pO2, Arterial: 125 mmHg — ABNORMAL HIGH (ref 80.0–100.0)

## 2013-08-31 LAB — MAGNESIUM: Magnesium: 2.6 mg/dL — ABNORMAL HIGH (ref 1.5–2.5)

## 2013-08-31 LAB — PHOSPHORUS: PHOSPHORUS: 2.8 mg/dL (ref 2.3–4.6)

## 2013-08-31 MED ORDER — VANCOMYCIN HCL 10 G IV SOLR
1750.0000 mg | Freq: Once | INTRAVENOUS | Status: AC
  Administered 2013-08-31: 1750 mg via INTRAVENOUS
  Filled 2013-08-31: qty 1750

## 2013-08-31 MED ORDER — PIPERACILLIN-TAZOBACTAM 3.375 G IVPB
3.3750 g | Freq: Once | INTRAVENOUS | Status: AC
  Administered 2013-08-31: 3.375 g via INTRAVENOUS
  Filled 2013-08-31: qty 50

## 2013-08-31 MED ORDER — VANCOMYCIN HCL 10 G IV SOLR
1750.0000 mg | INTRAVENOUS | Status: DC
  Administered 2013-09-01: 1750 mg via INTRAVENOUS
  Filled 2013-08-31: qty 1750

## 2013-08-31 MED ORDER — PIPERACILLIN-TAZOBACTAM 3.375 G IVPB
3.3750 g | Freq: Three times a day (TID) | INTRAVENOUS | Status: DC
  Administered 2013-08-31 – 2013-09-13 (×37): 3.375 g via INTRAVENOUS
  Filled 2013-08-31 (×44): qty 50

## 2013-08-31 MED ORDER — POTASSIUM CHLORIDE 20 MEQ/15ML (10%) PO LIQD
40.0000 meq | Freq: Once | ORAL | Status: AC
  Administered 2013-08-31: 40 meq via ORAL
  Filled 2013-08-31: qty 30

## 2013-08-31 MED ORDER — POTASSIUM CHLORIDE 10 MEQ/100ML IV SOLN: 10.0000 meq | INTRAVENOUS | Status: DC

## 2013-08-31 MED ORDER — INSULIN GLARGINE 100 UNIT/ML ~~LOC~~ SOLN: 10.0000 [IU] | Freq: Every day | SUBCUTANEOUS | Status: DC

## 2013-08-31 MED ORDER — DEXTROSE-NACL 5-0.45 % IV SOLN
INTRAVENOUS | Status: DC
  Administered 2013-08-31 – 2013-09-01 (×2): via INTRAVENOUS

## 2013-08-31 MED ORDER — INSULIN ASPART 100 UNIT/ML ~~LOC~~ SOLN
0.0000 [IU] | SUBCUTANEOUS | Status: DC
  Administered 2013-08-31: 3 [IU] via SUBCUTANEOUS
  Administered 2013-08-31: 8 [IU] via SUBCUTANEOUS
  Administered 2013-08-31 – 2013-09-01 (×3): 11 [IU] via SUBCUTANEOUS
  Administered 2013-09-01: 5 [IU] via SUBCUTANEOUS
  Administered 2013-09-01: 3 [IU] via SUBCUTANEOUS
  Administered 2013-09-01: 5 [IU] via SUBCUTANEOUS
  Administered 2013-09-01: 8 [IU] via SUBCUTANEOUS
  Administered 2013-09-02: 5 [IU] via SUBCUTANEOUS
  Administered 2013-09-02: 8 [IU] via SUBCUTANEOUS

## 2013-08-31 MED ORDER — HYDRALAZINE HCL 20 MG/ML IJ SOLN
10.0000 mg | INTRAMUSCULAR | Status: DC
  Administered 2013-08-31 – 2013-09-01 (×3): 10 mg via INTRAVENOUS
  Filled 2013-08-31: qty 0.5
  Filled 2013-08-31: qty 1
  Filled 2013-08-31 (×2): qty 0.5
  Filled 2013-08-31: qty 1
  Filled 2013-08-31 (×3): qty 0.5

## 2013-08-31 NOTE — Progress Notes (Signed)
Patient ID: Linda HarmsDavenia [name], female   DOB: 05/01/1975, 38 y.o.   MRN: [id number] 1 Day Post-Op  Subjective: Patient is sedated on ventilator.  Objective: Vital signs in last 24 hours: Temp:  [97.4 F (36.3 C)-100.3 F (37.9 C)] 97.4 F (36.3 C) (07/21 0835) Pulse Rate:  [75-120] 83 (07/21 0838) Resp:  [0-31] 16 (07/21 0838) BP: (110-142)/(65-99) 116/99 mmHg (07/21 0600) SpO2:  [94 %-100 %] 100 % (07/21 0838) FiO2 (%):  [40 %] 40 % (07/21 0838) Weight:  [298 lb 11.6 oz (135.5 kg)] 298 lb 11.6 oz (135.5 kg) (07/20 2016)    Intake/Output from previous day: 07/20 0701 - 07/21 0700 In: 4121.2 [I.V.:4121.2] Out: 725 [Urine:525; Blood:200] Intake/Output this shift:    PE: Skin: Left thigh wound is unpacked. This wound is almost completely clean. There is no further evidence of continued necrotic tissue.  I am not able to finger fracture any of her tissues. Her wound was repacked with 3 Kerlixes. There is no surrounding erythema or induration  Lab Results:   Recent Labs  08/30/13 1429 08/31/13 0220  WBC 24.0* 21.3*  HGB 11.6* 9.9*  HCT 33.6* 29.3*  PLT 173 185   BMET  Recent Labs  08/30/13 1429 08/31/13 0220  NA 125* 131*  K 4.6 3.8  CL 85* 94*  CO2 17* 21  GLUCOSE 430* 218*  BUN 48* 50*  CREATININE 1.85* 1.88*  CALCIUM 8.5 7.5*   PT/INR No results found for this basename: LABPROT, INR,  in the last 72 hours CMP     Component Value Date/Time   NA 131* 08/31/2013 0220   K 3.8 08/31/2013 0220   CL 94* 08/31/2013 0220   CO2 21 08/31/2013 0220   GLUCOSE 218* 08/31/2013 0220   BUN 50* 08/31/2013 0220   CREATININE 1.88* 08/31/2013 0220   CALCIUM 7.5* 08/31/2013 0220   PROT 6.9 08/31/2013 0220   ALBUMIN 1.5* 08/31/2013 0220   AST 27 08/31/2013 0220   ALT 19 08/31/2013 0220   ALKPHOS 154* 08/31/2013 0220   BILITOT 0.2* 08/31/2013 0220   GFRNONAA 33* 08/31/2013 0220   GFRAA 39* 08/31/2013 0220   Lipase  No results found for this basename: lipase        Studies/Results: Portable Chest Xray In Am  08/31/2013   CLINICAL DATA:  Respiratory failure.  EXAM: PORTABLE CHEST - 1 VIEW  COMPARISON:  08/30/2013.  FINDINGS: The endotracheal tube is 4 cm above the carina. The NG tube is coursing down the esophagus and into the stomach. The heart is enlarged but unchanged. Persistent mild perihilar edema and probable small effusions.  IMPRESSION: Stable support apparatus.  Persistent perihilar edema and areas of atelectasis.   Electronically Signed   By: Loralie ChampagneMark  Gallerani M.D.   On: 08/31/2013 07:29   Dg Chest Port 1 View  08/30/2013   CLINICAL DATA:  Endotracheal tube placement.  EXAM: PORTABLE CHEST - 1 VIEW  COMPARISON:  08/30/2013 and 02/21/2013.  FINDINGS: 2214 hr. Endotracheal tube appears unchanged, within the mid trachea. There is improved aeration of the lungs with residual patchy left greater than right basilar airspace opacities. There is no pleural effusion or pneumothorax. The heart size and mediastinal contours are stable.  IMPRESSION: Interval improved aeration of the lung bases. Endotracheal tube appears unchanged within the midtrachea.   Electronically Signed   By: Roxy HorsemanBill  Veazey M.D.   On: 08/30/2013 23:16   Portable Chest Xray  08/30/2013   CLINICAL DATA:  Endotracheal tube placement  EXAM: PORTABLE  CHEST - 1 VIEW  COMPARISON:  Chest radiograph 02/21/2013  FINDINGS: Markedly low lung volumes limit evaluation. The endotracheal tube terminates within the distal trachea approximately 5 mm superior to the carina. Cardiomegaly. Heterogeneous lower lung opacities, left greater than right. No pleural effusion or pneumothorax.  IMPRESSION: Endotracheal tube terminates within the distal trachea. Recommend retraction and re-evaluation with repeat radiograph.  Scattered heterogeneous opacities favored to represent atelectasis in the setting of markedly low lung volumes.  Discussed with Nurse Clarene Critchley at 8:43 p.m. on 08/30/2013.   Electronically  Signed   By: Annia Belt M.D.   On: 08/30/2013 20:44   Dg Abd Portable 1v  08/31/2013   CLINICAL DATA:  Enteric tube placement.  EXAM: PORTABLE ABDOMEN - 1 VIEW  COMPARISON:  Lumbar spine radiographs performed 09/26/2011  FINDINGS: The patient's enteric tube is seen ending at the body of the stomach, with the sideport at the fundus of the stomach.  The visualized bowel gas pattern is grossly unremarkable, with scattered air noted in the colon. No free intra-abdominal air is identified, though evaluation for free air is limited on a single supine view and due to motion artifact. No acute osseous abnormalities are seen.  IMPRESSION: Enteric tube is seen ending at the body of the stomach, with the sideport at the fundus of the stomach.   Electronically Signed   By: Roanna Raider M.D.   On: 08/31/2013 05:45    Anti-infectives: Anti-infectives   Start     Dose/Rate Route Frequency Ordered Stop   08/30/13 1515  [MAR Hold]  vancomycin (VANCOCIN) IVPB 1000 mg/200 mL premix     (On MAR Hold since 08/30/13 1616)   1,000 mg 200 mL/hr over 60 Minutes Intravenous  Once 08/30/13 1510 08/30/13 1647   08/30/13 1515  piperacillin-tazobactam (ZOSYN) IVPB 3.375 g     3.375 g 100 mL/hr over 30 Minutes Intravenous  Once 08/30/13 1510 08/30/13 1601       Assessment/Plan  1. POD1, status post irrigation and debridement of massive a left thigh necrosis and infection 2. Diabetes mellitus 3. Hypertension  Plan: 1. We will begin normal saline wet to dry dressing changes twice a day to her left thigh wound today. 2. Continue antibiotic therapy. 3. Defer ventilator care and weaning to CCM. 4. Do not see any evidence for repeat need in the OR at this time.   LOS: 1 day    Gustie Bobb E 08/31/2013, 8:40 AM Pager: [phone]

## 2013-08-31 NOTE — Progress Notes (Signed)
ANTIBIOTIC CONSULT NOTE - INITIAL  Pharmacy Consult for Vanc + Zosyn Indication: r/o sepsis thought to be d/t abscess  Allergies  Allergen Reactions  . Azithromycin Other (See Comments)    Nose bleeding event    Patient Measurements: Weight: 298 lb 11.6 oz (135.5 kg)  Vital Signs: Temp: 97.4 F (36.3 C) (07/21 0835) Temp src: Oral (07/21 0835) BP: 116/99 mmHg (07/21 0600) Pulse Rate: 83 (07/21 0838) Intake/Output from previous day: 07/20 0701 - 07/21 0700 In: 4246.2 [I.V.:4246.2] Out: 725 [Urine:525; Blood:200] Intake/Output from this shift: Total I/O In: 250 [I.V.:250] Out: 250 [Urine:250]  Labs:  Recent Labs  08/30/13 1429 08/31/13 0220  WBC 24.0* 21.3*  HGB 11.6* 9.9*  PLT 173 185  CREATININE 1.85* 1.88*   The CrCl is unknown because both a height and weight (above a minimum accepted value) are required for this calculation. No results found for this basename: VANCOTROUGH, VANCOPEAK, VANCORANDOM, GENTTROUGH, GENTPEAK, GENTRANDOM, TOBRATROUGH, TOBRAPEAK, TOBRARND, AMIKACINPEAK, AMIKACINTROU, AMIKACIN,  in the last 72 hours   Microbiology: Recent Results (from the past 720 hour(s))  CULTURE, BLOOD (ROUTINE X 2)     Status: None   Collection Time    08/30/13  2:29 PM      Result Value Ref Range Status   Specimen Description BLOOD LEFT ANTECUBITAL   Final   Special Requests     Final   Value: BOTTLES DRAWN AEROBIC AND ANAEROBIC 6CC AER 5CC ANA   Culture  Setup Time     Final   Value: 08/30/2013 19:36     Performed at [facility]   Culture     Final   Value:        BLOOD CULTURE RECEIVED NO GROWTH TO DATE CULTURE WILL BE HELD FOR 5 DAYS BEFORE ISSUING A FINAL NEGATIVE REPORT     Performed at [facility]   Report Status PENDING   Incomplete  CULTURE, BLOOD (ROUTINE X 2)     Status: None   Collection Time    08/30/13  2:41 PM      Result Value Ref Range Status   Specimen Description BLOOD LEFT HAND   Final   Special Requests BOTTLES DRAWN  AEROBIC AND ANAEROBIC 6CC   Final   Culture  Setup Time     Final   Value: 08/30/2013 19:36     Performed at [facility]   Culture     Final   Value:        BLOOD CULTURE RECEIVED NO GROWTH TO DATE CULTURE WILL BE HELD FOR 5 DAYS BEFORE ISSUING A FINAL NEGATIVE REPORT     Performed at [facility]   Report Status PENDING   Incomplete  WOUND CULTURE     Status: None   Collection Time    08/30/13  5:06 PM      Result Value Ref Range Status   Specimen Description WOUND THIGH LEFT   Final   Special Requests PATIENT ON FOLLOWING VANCOMYCIN, ZOSYN   Final   Gram Stain     Final   Value: RARE WBC PRESENT, PREDOMINANTLY MONONUCLEAR     NO SQUAMOUS EPITHELIAL CELLS SEEN     ABUNDANT GRAM NEGATIVE RODS     FEW GRAM POSITIVE COCCI IN PAIRS     IN CLUSTERS FEW GRAM POSITIVE RODS   Culture     Final   Value: NO GROWTH     Performed at [facility]   Report Status PENDING  Incomplete  ANAEROBIC CULTURE     Status: None   Collection Time    08/30/13  5:06 PM      Result Value Ref Range Status   Specimen Description WOUND THIGH LEFT   Final   Special Requests PATIENT ON FOLLOWING VANCOMYCIN, ZOSYN   Final   Gram Stain     Final   Value: RARE WBC PRESENT, PREDOMINANTLY MONONUCLEAR     NO SQUAMOUS EPITHELIAL CELLS SEEN     ABUNDANT GRAM NEGATIVE RODS     FEW GRAM POSITIVE COCCI IN PAIRS     IN CLUSTERS FEW GRAM POSITIVE RODS   Culture PENDING   Incomplete   Report Status PENDING   Incomplete  MRSA PCR SCREENING     Status: None   Collection Time    08/30/13  7:11 PM      Result Value Ref Range Status   MRSA by PCR NEGATIVE  NEGATIVE Final   Comment:            The GeneXpert MRSA Assay (FDA     approved for NASAL specimens     only), is one component of a     comprehensive MRSA colonization     surveillance program. It is not     intended to diagnose MRSA     infection nor to guide or     monitor treatment for     MRSA infections.    Medical  History: Past Medical History  Diagnosis Date  . Morbid obesity   . Hypertension   . CVA (cerebral infarction) 1990's    "writing is not the same since"  . Asthma   . Type II diabetes mellitus   . GERD (gastroesophageal reflux disease)     Assessment: 6 YOF who presented to the [facility] on 7/20 with a left groin abscess associated with fevers, malaise, and weakness. Surgery consulted the pt was taken to the OR for debridement. Pharmacy was consulted to start Vancomycin + Zosyn for empiric coverage of r/o sepsis in the setting of the abscess.  The patient received a dose of Vancomycin 1g and Zosyn 3.375g in the [facility] on 7/20. The patient's baseline renal function is <1, admit SCr 1.88 - indicating acute renal failure. Will not re-load Vancomycin at this time as the patient may still be holding onto the dose given on 7/20. Will plan to start maintenance doses and will re-adjust on 7/22 based on trends in renal function.   Vanc 7/20 >> Zosyn 7/20 >>  7/20 WCx >> 7/20 BCx >>  Goal of Therapy:  Vancomycin trough level 15-20 mcg/ml  Plan:  1. Vancomycin 1750 mg IV every 24 hours 2. Zosyn 3.375g IV every 8 hours 3. Will continue to follow renal function, culture results, LOT, and antibiotic de-escalation plans   Georgina Pillion, PharmD, BCPS Clinical Pharmacist Pager: [phone] 08/31/2013 2:16 PM

## 2013-08-31 NOTE — Progress Notes (Signed)
I have seen and examined the pt and agree with PA-Osborne's progress note.  Con't dressing changes Vent weaning per CCM Abx

## 2013-08-31 NOTE — Progress Notes (Signed)
PULMONARY / CRITICAL CARE MEDICINE   Name: [name] MRN: [id number] DOB: May 19, 1975    ADMISSION DATE:  08/30/2013  REFERRING MD :  Derrell Lolling  CHIEF COMPLAINT:  Leg wound  INITIAL PRESENTATION:  38 yo female developed boil in Lt thigh one week ago.  Presented with fever (Tm 102F), lethargy.  Found to have Lt leg abscess complicated by sepsis, hyperglycemia.  Was [facility] OR for I&D and remained on vent post op.  She has hx of DM, HTN, asthma.  SIGNIFICANT EVENTS / STUDIES:  7/20 - Admit, to OR for L-thigh abscess irrigation / debridement, started Vanc / Zosyn         - Started on Glucose Stabilizer, Glucose >400, AG 23, HCO3 17 >> 21, elevated lactate 3.60, HypoNa 125 >> 131 7/21 - Afebrile, improved CBGs < 140 x 3, K 3.8, WBC 21.3         - CXR without acute infiltrate, noted, persistent perihilar edema and areas of atelectasis.  LINES/TUBES: - ETT 7/20 >>> - OGT 7/20 >>>  ANTIBIOTICS: - Vanc 7/20 >>> - Zosyn 7/20 >>>  CULTURES: - Blood x 2, 7/20 >>> - Wound Cx (L-thigh abscess, OR drain) 7/20 >> GNR, few GPC >> No Growth  SUBJECTIVE:  Per RN, remains sedated on Propofol weaned 40 to 20, no weaning on vent yet. Remains on glucose stabilizer with resolved CBGs.   VITAL SIGNS: Temp:  [97.4 F (36.3 C)-100.3 F (37.9 C)] 97.4 F (36.3 C) (07/21 0835) Pulse Rate:  [75-120] 83 (07/21 0838) Resp:  [0-31] 16 (07/21 0838) BP: (110-142)/(65-99) 116/99 mmHg (07/21 0600) SpO2:  [94 %-100 %] 100 % (07/21 0838) FiO2 (%):  [40 %] 40 % (07/21 0838) Weight:  [298 lb 11.6 oz (135.5 kg)] 298 lb 11.6 oz (135.5 kg) (07/20 2016) HEMODYNAMICS:   VENTILATOR SETTINGS: Vent Mode:  [-] PRVC FiO2 (%):  [40 %] 40 % Set Rate:  [16 bmp] 16 bmp Vt Set:  [550 mL] 550 mL PEEP:  [5 cmH20] 5 cmH20 Plateau Pressure:  [19 cmH20-26 cmH20] 21 cmH20 INTAKE / OUTPUT: Intake/Output     07/20 0701 - 07/21 0700 07/21 0701 - 07/22 0700   I.V. (mL/kg) 4246.2 (31.3) 250 (1.8)   Total Intake(mL/kg)  4246.2 (31.3) 250 (1.8)   Urine (mL/kg/hr) 525 250 (0.7)   Blood 200    Total Output 725 250   Net +3521.2 0          PHYSICAL EXAMINATION: General:  Ill appearing, on vent Neuro:  RASS -3 HEENT:  Pupils reactive, ETT in place Cardiovascular:  Regular, tachycardic Lungs:  Scattered rhonchi Abdomen:  Soft, non tender, + bowel sounds Musculoskeletal:  1+ edema Skin:  L-thigh wound repacked, appears clean, no surrounding erythema or induration  LABS:  CBC  Recent Labs Lab 08/30/13 1429 08/31/13 0220  WBC 24.0* 21.3*  HGB 11.6* 9.9*  HCT 33.6* 29.3*  PLT 173 185   Coag's No results found for this basename: APTT, INR,  in the last 168 hours  BMET  Recent Labs Lab 08/30/13 1429 08/31/13 0220  NA 125* 131*  K 4.6 3.8  CL 85* 94*  CO2 17* 21  BUN 48* 50*  CREATININE 1.85* 1.88*  GLUCOSE 430* 218*   Electrolytes  Recent Labs Lab 08/30/13 1429 08/31/13 0220  CALCIUM 8.5 7.5*  MG  --  2.6*  PHOS  --  2.8   Sepsis Markers  Recent Labs Lab 08/30/13 1450 08/30/13 2110  LATICACIDVEN 3.60* 2.8*  ABG  Recent Labs Lab 08/30/13 2017  PHART 7.308*  PCO2ART 43.6  PO2ART 93.0    Liver Enzymes  Recent Labs Lab 08/30/13 1429 08/31/13 0220  AST 36 27  ALT 20 19  ALKPHOS 140* 154*  BILITOT 0.4 0.2*  ALBUMIN 1.7* 1.5*   Glucose  Recent Labs Lab 08/31/13 0301 08/31/13 0343 08/31/13 0455 08/31/13 0601 08/31/13 0704 08/31/13 0820  GLUCAP 188* 168* 143* 124* 102* 94    Imaging Portable Chest Xray In Am  08/31/2013   CLINICAL DATA:  Respiratory failure.  EXAM: PORTABLE CHEST - 1 VIEW  COMPARISON:  08/30/2013.  FINDINGS: The endotracheal tube is 4 cm above the carina. The NG tube is coursing down the esophagus and into the stomach. The heart is enlarged but unchanged. Persistent mild perihilar edema and probable small effusions.  IMPRESSION: Stable support apparatus.  Persistent perihilar edema and areas of atelectasis.   Electronically Signed    By: Loralie ChampagneMark  Gallerani M.D.   On: 08/31/2013 07:29   Dg Chest Port 1 View  08/30/2013   CLINICAL DATA:  Endotracheal tube placement.  EXAM: PORTABLE CHEST - 1 VIEW  COMPARISON:  08/30/2013 and 02/21/2013.  FINDINGS: 2214 hr. Endotracheal tube appears unchanged, within the mid trachea. There is improved aeration of the lungs with residual patchy left greater than right basilar airspace opacities. There is no pleural effusion or pneumothorax. The heart size and mediastinal contours are stable.  IMPRESSION: Interval improved aeration of the lung bases. Endotracheal tube appears unchanged within the midtrachea.   Electronically Signed   By: Roxy HorsemanBill  Veazey M.D.   On: 08/30/2013 23:16   Portable Chest Xray  08/30/2013   CLINICAL DATA:  Endotracheal tube placement  EXAM: PORTABLE CHEST - 1 VIEW  COMPARISON:  Chest radiograph 02/21/2013  FINDINGS: Markedly low lung volumes limit evaluation. The endotracheal tube terminates within the distal trachea approximately 5 mm superior to the carina. Cardiomegaly. Heterogeneous lower lung opacities, left greater than right. No pleural effusion or pneumothorax.  IMPRESSION: Endotracheal tube terminates within the distal trachea. Recommend retraction and re-evaluation with repeat radiograph.  Scattered heterogeneous opacities favored to represent atelectasis in the setting of markedly low lung volumes.  Discussed with Nurse Clarene CritchleyLindsey Varner at 8:43 p.m. on 08/30/2013.   Electronically Signed   By: Annia Beltrew  Davis M.D.   On: 08/30/2013 20:44   Dg Abd Portable 1v  08/31/2013   CLINICAL DATA:  Enteric tube placement.  EXAM: PORTABLE ABDOMEN - 1 VIEW  COMPARISON:  Lumbar spine radiographs performed 09/26/2011  FINDINGS: The patient's enteric tube is seen ending at the body of the stomach, with the sideport at the fundus of the stomach.  The visualized bowel gas pattern is grossly unremarkable, with scattered air noted in the colon. No free intra-abdominal air is identified, though evaluation  for free air is limited on a single supine view and due to motion artifact. No acute osseous abnormalities are seen.  IMPRESSION: Enteric tube is seen ending at the body of the stomach, with the sideport at the fundus of the stomach.   Electronically Signed   By: Roanna RaiderJeffery  Chang M.D.   On: 08/31/2013 05:45     ASSESSMENT / PLAN:  PULMONARY [place] 7/20 >> A: Acute respiratory failure 2nd to sepsis - On vent Hx of asthma. P:   Full vent support until more stable, wean trial today, consider extubation after assessment rsbi, neurostatus Repeat CXR tomorrow AM Pulmicort, brovana with prn albuterol If remains on vent, increase MV  CARDIOVASCULAR A:  Sepsis, without evidence of hypotension or shock - secondary to L-thigh abscess s/p OR drained Hx of HTN. hypovolemia P:  Monitor hemodynamics, stable BP, no pressor support Continue IVF maintain Avoid line  Lactic acid clearing tele  RENAL A:   Lactic acidosis  Improving AKI - in setting of sepsis, hypovolemia, ATN Hyponatremia - Improved to 131 P:   Optimize hemodynamics Monitor renal fx, urine outpt, electrolytes UA Volume pos goals  GASTROINTESTINAL A:   Nutrition Morbid obesity P:   NPO unless not extubated Tube feeds if unable to extubate soon Protonix for SUP  HEMATOLOGIC A:   Leukocytosis - Improved P:  F/u CBC SQ heparin for DVT prevention No lovenox with body wt  INFECTIOUS A:   Sepsis, without evidence of hypotension or shock - Afebrile, improved WBC L-thigh Abscess (30x15x10cm) - s/p OR 7/20 irrigation / drainage No evidence nec fasc P:   - Continue Vanc / Zosyn- dose adjust per pharmacy  - Antibiotics and cultures as above Post-op care per [facility], dressing changes -if declines add clinda  ENDOCRINE A:   DM type II with hyperglycemia, likely secondary to acute abscess infection P:   -ICU hyperglycemia protocol with insulin gtt for now - Start D5 1/2NS @ 125 cc/hr, now CBGs < 140 x 3 - Transition  off Glucose stabilizer, start Lantus 10u when on diet, then DC stabilizer after 2 hours, re-check BMET - Continue BMET q 4 hr - Not on home insulin, holding home metformin  NEUROLOGIC A:   Acute encephalopathy 2nd to sepsis. P:   RASS goal: -1 PAD 3 until more stable Reducing sedation  TODAY'S SUMMARY:  Remains on vent with sedation, anticipate SBT and consider extubation today vs tomorrow, initially acute encephalopathy in setting of sepsis / hyperglycemia. Surgery following (s/p L-thigh abscess OR irrigation / debride) improved, continue Vanc / Zosyn. Transition off glucose stabilizer today to SQ insulin.  Saralyn Pilar, DO [facility], PGY-2  08/31/2013, 9:47 AM  Ccm time 30 min   I have fully examined this patient and agree with above findings.      Mcarthur Rossetti. Tyson Alias, MD, FACP Pgr: [phone] [place] [facility]

## 2013-08-31 NOTE — Procedures (Signed)
Extubation Procedure Note  Patient Details:   Name: Linda HarmsDavenia [name] DOB: 05/28/1975 MRN: [id number]   Airway Documentation:  Airway 7.5 mm (Active)  Secured at (cm) 21 cm 08/31/2013  8:38 AM  Measured From Lips 08/31/2013  8:38 AM  Secured Location Center 08/31/2013  8:38 AM  Secured By [employer] Tube Holder 08/31/2013  8:38 AM  Tube Holder Repositioned Yes 08/31/2013  8:38 AM  Cuff Pressure (cm H2O) 25 cm H2O 08/30/2013  8:01 PM  Site Condition Dry 08/31/2013  8:38 AM    Evaluation  O2 sats: stable throughout Complications: No apparent complications Patient did tolerate procedure well. Bilateral Breath Sounds: Clear Suctioning: Airway Yes Extubated PT to 3L [name]. PT was able to tell me her name. Sats are stable   Shaili Donalson, Duane LopeJeffrey D 08/31/2013, 11:25 AM

## 2013-09-01 LAB — URINE CULTURE
Colony Count: NO GROWTH
Culture: NO GROWTH

## 2013-09-01 LAB — BASIC METABOLIC PANEL
Anion gap: 15 (ref 5–15)
BUN: 42 mg/dL — AB (ref 6–23)
CO2: 20 mEq/L (ref 19–32)
Calcium: 7.7 mg/dL — ABNORMAL LOW (ref 8.4–10.5)
Chloride: 97 mEq/L (ref 96–112)
Creatinine, Ser: 1.38 mg/dL — ABNORMAL HIGH (ref 0.50–1.10)
GFR, EST AFRICAN AMERICAN: 56 mL/min — AB (ref >90)
GFR, EST NON AFRICAN AMERICAN: 48 mL/min — AB (ref >90)
GLUCOSE: 266 mg/dL — AB (ref 70–99)
Potassium: 4 mEq/L (ref 3.7–5.3)
SODIUM: 132 meq/L — AB (ref 137–147)

## 2013-09-01 LAB — CULTURE, BLOOD (ROUTINE X 2)

## 2013-09-01 LAB — PROCALCITONIN: Procalcitonin: 21.91 ng/mL

## 2013-09-01 LAB — POCT I-STAT GLUCOSE
Glucose, Bld: 542 mg/dL — ABNORMAL HIGH (ref 70–99)
Operator id: 132841

## 2013-09-01 LAB — CBC
HCT: 30.2 % — ABNORMAL LOW (ref 36.0–46.0)
HEMOGLOBIN: 10.1 g/dL — AB (ref 12.0–15.0)
MCH: 27 pg (ref 26.0–34.0)
MCHC: 33.4 g/dL (ref 30.0–36.0)
MCV: 80.7 fL (ref 78.0–100.0)
Platelets: 237 10*3/uL (ref 150–400)
RBC: 3.74 MIL/uL — AB (ref 3.87–5.11)
RDW: 14.1 % (ref 11.5–15.5)
WBC: 24 10*3/uL — ABNORMAL HIGH (ref 4.0–10.5)

## 2013-09-01 LAB — GLUCOSE, CAPILLARY
GLUCOSE-CAPILLARY: 237 mg/dL — AB (ref 70–99)
Glucose-Capillary: 270 mg/dL — ABNORMAL HIGH (ref 70–99)
Glucose-Capillary: 197 mg/dL — ABNORMAL HIGH (ref 70–99)
Glucose-Capillary: 264 mg/dL — ABNORMAL HIGH (ref 70–99)
Glucose-Capillary: 312 mg/dL — ABNORMAL HIGH (ref 70–99)
Glucose-Capillary: 312 mg/dL — ABNORMAL HIGH (ref 70–99)

## 2013-09-01 MED ORDER — PROMETHAZINE HCL 25 MG/ML IJ SOLN: 6.2500 mg | INTRAMUSCULAR | Status: DC | PRN

## 2013-09-01 MED ORDER — MEPERIDINE HCL 25 MG/ML IJ SOLN: 6.2500 mg | INTRAMUSCULAR | Status: DC | PRN

## 2013-09-01 MED ORDER — INSULIN GLARGINE 100 UNIT/ML ~~LOC~~ SOLN
15.0000 [IU] | Freq: Every day | SUBCUTANEOUS | Status: DC
  Administered 2013-09-01: 15 [IU] via SUBCUTANEOUS
  Filled 2013-09-01 (×2): qty 0.15

## 2013-09-01 MED ORDER — PANTOPRAZOLE SODIUM 40 MG PO TBEC
40.0000 mg | DELAYED_RELEASE_TABLET | Freq: Every day | ORAL | Status: DC
  Administered 2013-09-01: 40 mg via ORAL
  Filled 2013-09-01: qty 1

## 2013-09-01 MED ORDER — OXYCODONE HCL 5 MG PO TABS: 5.0000 mg | ORAL_TABLET | Freq: Once | ORAL | Status: DC | PRN

## 2013-09-01 MED ORDER — VANCOMYCIN HCL 10 G IV SOLR
1250.0000 mg | Freq: Two times a day (BID) | INTRAVENOUS | Status: DC
  Administered 2013-09-02 – 2013-09-03 (×3): 1250 mg via INTRAVENOUS
  Filled 2013-09-01 (×4): qty 1250

## 2013-09-01 MED ORDER — OXYCODONE HCL 5 MG/5ML PO SOLN: 5.0000 mg | Freq: Once | ORAL | Status: DC | PRN

## 2013-09-01 MED ORDER — HYDRALAZINE HCL 50 MG PO TABS
50.0000 mg | ORAL_TABLET | Freq: Three times a day (TID) | ORAL | Status: DC
  Filled 2013-09-01 (×5): qty 1

## 2013-09-01 MED ORDER — INSULIN GLARGINE 100 UNIT/ML ~~LOC~~ SOLN
5.0000 [IU] | Freq: Every day | SUBCUTANEOUS | Status: DC
Start: 2013-09-01 — End: 2013-09-01

## 2013-09-01 MED ORDER — HYDROMORPHONE HCL PF 1 MG/ML IJ SOLN: 0.2500 mg | INTRAMUSCULAR | Status: DC | PRN

## 2013-09-01 NOTE — Progress Notes (Signed)
ANTIBIOTIC CONSULT NOTE - FOLLOW UP  Pharmacy Consult for Vancomycin + Zosyn Indication: r/o sepsis/abscess  Allergies  Allergen Reactions  . Azithromycin Other (See Comments)    Nose bleeding event    Patient Measurements: Weight: 298 lb 11.6 oz (135.5 kg)  Vital Signs: Temp: 98.1 F (36.7 C) (07/22 0840) Temp src: Oral (07/22 0840) BP: 129/77 mmHg (07/22 1000) Pulse Rate: 88 (07/22 1000) Intake/Output from previous day: 07/21 0701 - 07/22 0700 In: 2140 [P.O.:240; I.V.:1750; IV Piggyback:150] Out: 1190 [Urine:1190] Intake/Output from this shift: Total I/O In: -  Out: 140 [Urine:140]  Labs:  Recent Labs  08/30/13 1429 08/31/13 0220  08/31/13 1825 08/31/13 2208 09/01/13 0406 09/01/13 0925  WBC 24.0* 21.3*  --   --   --   --  24.0*  HGB 11.6* 9.9*  --   --   --   --  10.1*  PLT 173 185  --   --   --   --  237  CREATININE 1.85* 1.88*  < > 1.35* 1.41* 1.38*  --   < > = values in this interval not displayed. The CrCl is unknown because both a height and weight (above a minimum accepted value) are required for this calculation. No results found for this basename: VANCOTROUGH, VANCOPEAK, VANCORANDOM, GENTTROUGH, GENTPEAK, GENTRANDOM, TOBRATROUGH, TOBRAPEAK, TOBRARND, AMIKACINPEAK, AMIKACINTROU, AMIKACIN,  in the last 72 hours   Microbiology: Recent Results (from the past 720 hour(s))  CULTURE, BLOOD (ROUTINE X 2)     Status: None   Collection Time    08/30/13  2:29 PM      Result Value Ref Range Status   Specimen Description BLOOD LEFT ANTECUBITAL   Final   Special Requests     Final   Value: BOTTLES DRAWN AEROBIC AND ANAEROBIC 6CC AER 5CC ANA   Culture  Setup Time     Final   Value: 08/30/2013 19:36     Performed at [facility]   Culture     Final   Value:        BLOOD CULTURE RECEIVED NO GROWTH TO DATE CULTURE WILL BE HELD FOR 5 DAYS BEFORE ISSUING A FINAL NEGATIVE REPORT     Performed at [facility]   Report Status PENDING   Incomplete   CULTURE, BLOOD (ROUTINE X 2)     Status: None   Collection Time    08/30/13  2:41 PM      Result Value Ref Range Status   Specimen Description BLOOD LEFT HAND   Final   Special Requests BOTTLES DRAWN AEROBIC AND ANAEROBIC 6CC   Final   Culture  Setup Time     Final   Value: 08/30/2013 19:36     Performed at [facility]   Culture     Final   Value: STAPHYLOCOCCUS SPECIES (COAGULASE NEGATIVE)     Note: THE SIGNIFICANCE OF ISOLATING THIS ORGANISM FROM A SINGLE SET OF BLOOD CULTURES WHEN MULTIPLE SETS ARE DRAWN IS UNCERTAIN. PLEASE NOTIFY THE MICROBIOLOGY DEPARTMENT WITHIN ONE WEEK IF SPECIATION AND SENSITIVITIES ARE REQUIRED.     Note: Gram Stain Report Called to,Read Back By and Verified With: SARA G@12 :54PM ON 08/31/13 BY DANTS     Performed at [facility]   Report Status 09/01/2013 FINAL   Final  WOUND CULTURE     Status: None   Collection Time    08/30/13  5:06 PM      Result Value Ref Range Status   Specimen Description  WOUND THIGH LEFT   Final   Special Requests PATIENT ON FOLLOWING VANCOMYCIN, ZOSYN   Final   Gram Stain     Final   Value: RARE WBC PRESENT, PREDOMINANTLY MONONUCLEAR     NO SQUAMOUS EPITHELIAL CELLS SEEN     ABUNDANT GRAM NEGATIVE RODS     FEW GRAM POSITIVE COCCI IN PAIRS     IN CLUSTERS FEW GRAM POSITIVE RODS   Culture     Final   Value: NO GROWTH 1 DAY     [facility]   Report Status PENDING   Incomplete  ANAEROBIC CULTURE     Status: None   Collection Time    08/30/13  5:06 PM      Result Value Ref Range Status   Specimen Description WOUND THIGH LEFT   Final   Special Requests PATIENT ON FOLLOWING VANCOMYCIN, ZOSYN   Final   Gram Stain     Final   Value: RARE WBC PRESENT, PREDOMINANTLY MONONUCLEAR     NO SQUAMOUS EPITHELIAL CELLS SEEN     ABUNDANT GRAM NEGATIVE RODS     FEW GRAM POSITIVE COCCI IN PAIRS     IN CLUSTERS FEW GRAM POSITIVE RODS   Culture     Final   Value: NO ANAEROBES ISOLATED; CULTURE IN PROGRESS  FOR 5 DAYS     [facility]   Report Status PENDING   Incomplete  MRSA PCR SCREENING     Status: None   Collection Time    08/30/13  7:11 PM      Result Value Ref Range Status   MRSA by PCR NEGATIVE  NEGATIVE Final   Comment:            The GeneXpert MRSA Assay (FDA     approved for NASAL specimens     only), is one component of a     comprehensive MRSA colonization     surveillance program. It is not     intended to diagnose MRSA     infection nor to guide or     monitor treatment for     MRSA infections.  URINE CULTURE     Status: None   Collection Time    08/30/13  7:12 PM      Result Value Ref Range Status   Specimen Description URINE, CATHETERIZED   Final   Special Requests NONE   Final   Culture  Setup Time     Final   Value: 08/31/2013 00:49     [facility]   Colony Count     Final   Value: NO GROWTH     [facility]   Culture     Final   Value: NO GROWTH     [facility]   Report Status 09/01/2013 FINAL   Final    Anti-infectives   Start     Dose/Rate Route Frequency Ordered Stop   09/01/13 1100  vancomycin (VANCOCIN) 1,750 mg in sodium chloride 0.9 % 500 mL IVPB     1,750 mg 250 mL/hr over 120 Minutes Intravenous Every 24 hours 08/31/13 1418     08/31/13 2000  piperacillin-tazobactam (ZOSYN) IVPB 3.375 g     3.375 g 12.5 mL/hr over 240 Minutes Intravenous Every 8 hours 08/31/13 1026     08/31/13 1200  vancomycin (VANCOCIN) 1,750 mg in sodium chloride 0.9 % 500 mL IVPB     1,750  mg 250 mL/hr over 120 Minutes Intravenous  Once 08/31/13 1036 08/31/13 1303   08/31/13 1100  piperacillin-tazobactam (ZOSYN) IVPB 3.375 g     3.375 g 12.5 mL/hr over 240 Minutes Intravenous  Once 08/31/13 1026 08/31/13 1504   08/30/13 1515  [MAR Hold]  vancomycin (VANCOCIN) IVPB 1000 mg/200 mL premix     (On MAR Hold since 08/30/13 1616)   1,000 mg 200 mL/hr over 60 Minutes Intravenous  Once  08/30/13 1510 08/30/13 1647   08/30/13 1515  piperacillin-tazobactam (ZOSYN) IVPB 3.375 g     3.375 g 100 mL/hr over 30 Minutes Intravenous  Once 08/30/13 1510 08/30/13 1601      Assessment: 37 YOF who continues on Vancomycin + Zosyn for r/o sepsis thought to be d/t wound/abscess, s/p I&D on 7/20. Tmax/24h: 100.7, WBC 24 << 21.3. Pt noted to be in AKI on admit - now resolving. SCr 1.38, CrCl~60-70 ml/min, UOP/24h: 0.4 ml/kg/hr. Will plan to increase Vancomycin dose starting on 7/23 AM.  Vanc 7/20 >> Zosyn 7/20 >>  7/20 WCx >>  7/20 BCx >> 1/2 CoNS (deemed contaminant) 7/20 UCx >> NG  Goal of Therapy:  Vancomycin trough level 15-20 mcg/ml  Plan:  1. Adjust Vancomycin to 1250 mg IV every 12 hours (starting on 7/23) 2. Continue Zosyn 3.375g IV every 8 hours 3. Will continue to follow renal function, culture results, LOT, and antibiotic de-escalation plans   Georgina Pillion, PharmD, BCPS Clinical Pharmacist Pager: [phone] 09/01/2013 12:51 PM

## 2013-09-01 NOTE — Progress Notes (Signed)
Report received from 3S RN for transfer to [phone]

## 2013-09-01 NOTE — Progress Notes (Signed)
PULMONARY / CRITICAL CARE MEDICINE   Name: [name] MRN: [id number] DOB: Dec 02, 1975    ADMISSION DATE:  08/30/2013  REFERRING MD :  Derrell Lolling  CHIEF COMPLAINT:  Leg wound  INITIAL PRESENTATION:  38 yo female developed boil in Lt thigh one week ago.  Presented with fever (Tm 102F), lethargy.  Found to have Lt leg abscess complicated by sepsis, hyperglycemia.  Was takento OR for I&D and remained on vent post op.  She has hx of DM, HTN, asthma.  SIGNIFICANT EVENTS / STUDIES:  7/20 - Admit, to OR for L-thigh abscess irrigation / debridement, started Vanc / Zosyn         - Started on Glucose Stabilizer, Glucose >400, AG 23, HCO3 17 >> 21, elevated lactate 3.60, HypoNa 125 >> 131 7/21 - Afebrile, improved CBGs < 140 x 3, K 3.8, WBC 21.3         - CXR without acute infiltrate, noted, persistent perihilar edema and areas of atelectasis. 7/22 - Transfer to med >> TRH primary  LINES/TUBES: - ETT 7/20 >>> 7/21 - OGT 7/20 >>> 7/21  ANTIBIOTICS: - Vanc 7/20 >>> - Zosyn 7/20 >>>  CULTURES: - Blood x1, 7/20 >>>GPC clusters - Wound Cx (L-thigh abscess, OR drain) 7/20 >> GNR, few GPC >> No Growth - Urine 7/20 >>> negative  SUBJECTIVE:  Successfully extubated yesterday, tolerated well, improved mental status, tolerating diet. Complains of pain at skin surgical site. Denies other complaints.  VITAL SIGNS: Temp:  [97.4 F (36.3 C)-100.7 F (38.2 C)] 98.7 F (37.1 C) (07/22 0400) Pulse Rate:  [80-107] 86 (07/22 0500) Resp:  [0-28] 22 (07/22 0500) BP: (113-157)/(65-119) 122/65 mmHg (07/22 0500) SpO2:  [93 %-100 %] 98 % (07/22 0500) FiO2 (%):  [40 %] 40 % (07/21 0838) HEMODYNAMICS:   VENTILATOR SETTINGS: Vent Mode:  [-] PRVC FiO2 (%):  [40 %] 40 % Set Rate:  [16 bmp] 16 bmp Vt Set:  [550 mL] 550 mL PEEP:  [5 cmH20] 5 cmH20 Plateau Pressure:  [21 cmH20] 21 cmH20 INTAKE / OUTPUT: Intake/Output     07/21 0701 - 07/22 0700 07/22 0701 - 07/23 0700   P.O. 240    I.V. (mL/kg) 1750  (12.9)    IV Piggyback 150    Total Intake(mL/kg) 2140 (15.8)    Urine (mL/kg/hr) 1190 (0.4)    Blood     Total Output 1190     Net +950            PHYSICAL EXAMINATION: General: obese, some discomfort due to pain at surgical site otherwise NAD Neuro:  Awake, alert, oriented, follows commands, non-focal Cardiovascular:  RRR, no murmurs Lungs:  Improved breath sounds bilateral, mostly resolved rhonchi. Normal work of breathing Abdomen:  Soft, non tender, non-distended, +active BS Ext: bilateral edema unchanged Skin:  L-thigh wound repacked, appears clean, no surrounding erythema or induration  LABS:  CBC  Recent Labs Lab 08/30/13 1429 08/31/13 0220  WBC 24.0* 21.3*  HGB 11.6* 9.9*  HCT 33.6* 29.3*  PLT 173 185   Coag's No results found for this basename: APTT, INR,  in the last 168 hours  BMET  Recent Labs Lab 08/31/13 1825 08/31/13 2208 09/01/13 0406  NA 130* 131* 132*  K 4.4 4.0 4.0  CL 93* 100 97  CO2 21 20 20   BUN 43* 43* 42*  CREATININE 1.35* 1.41* 1.38*  GLUCOSE 328* 297* 266*   Electrolytes  Recent Labs Lab 08/31/13 0220  08/31/13 1825 08/31/13 2208 09/01/13 0406  CALCIUM 7.5*  < > 7.6* 7.4* 7.7*  MG 2.6*  --   --   --   --   PHOS 2.8  --   --   --   --   < > = values in this interval not displayed. Sepsis Markers  Recent Labs Lab 08/30/13 1450 08/30/13 2110  LATICACIDVEN 3.60* 2.8*   ABG  Recent Labs Lab 08/30/13 2017 08/31/13 2321  PHART 7.308* 7.468*  PCO2ART 43.6 30.4*  PO2ART 93.0 125.0*    Liver Enzymes  Recent Labs Lab 08/30/13 1429 08/31/13 0220  AST 36 27  ALT 20 19  ALKPHOS 140* 154*  BILITOT 0.4 0.2*  ALBUMIN 1.7* 1.5*   Glucose  Recent Labs Lab 08/31/13 1057 08/31/13 1204 08/31/13 1813 08/31/13 1925 09/01/13 0007 09/01/13 0419  GLUCAP 160* 172* 288* 331* 237* 270*    Imaging Portable Chest Xray In Am  08/31/2013   CLINICAL DATA:  Respiratory failure.  EXAM: PORTABLE CHEST - 1 VIEW   COMPARISON:  08/30/2013.  FINDINGS: The endotracheal tube is 4 cm above the carina. The NG tube is coursing down the esophagus and into the stomach. The heart is enlarged but unchanged. Persistent mild perihilar edema and probable small effusions.  IMPRESSION: Stable support apparatus.  Persistent perihilar edema and areas of atelectasis.   Electronically Signed   By: Loralie ChampagneMark  Gallerani M.D.   On: 08/31/2013 07:29   Dg Chest Port 1 View  08/30/2013   CLINICAL DATA:  Endotracheal tube placement.  EXAM: PORTABLE CHEST - 1 VIEW  COMPARISON:  08/30/2013 and 02/21/2013.  FINDINGS: 2214 hr. Endotracheal tube appears unchanged, within the mid trachea. There is improved aeration of the lungs with residual patchy left greater than right basilar airspace opacities. There is no pleural effusion or pneumothorax. The heart size and mediastinal contours are stable.  IMPRESSION: Interval improved aeration of the lung bases. Endotracheal tube appears unchanged within the midtrachea.   Electronically Signed   By: Roxy HorsemanBill  Veazey M.D.   On: 08/30/2013 23:16   Portable Chest Xray  08/30/2013   CLINICAL DATA:  Endotracheal tube placement  EXAM: PORTABLE CHEST - 1 VIEW  COMPARISON:  Chest radiograph 02/21/2013  FINDINGS: Markedly low lung volumes limit evaluation. The endotracheal tube terminates within the distal trachea approximately 5 mm superior to the carina. Cardiomegaly. Heterogeneous lower lung opacities, left greater than right. No pleural effusion or pneumothorax.  IMPRESSION: Endotracheal tube terminates within the distal trachea. Recommend retraction and re-evaluation with repeat radiograph.  Scattered heterogeneous opacities favored to represent atelectasis in the setting of markedly low lung volumes.  Discussed with Nurse Clarene CritchleyLindsey Varner at 8:43 p.m. on 08/30/2013.   Electronically Signed   By: Annia Beltrew  Davis M.D.   On: 08/30/2013 20:44   Dg Abd Portable 1v  08/31/2013   CLINICAL DATA:  Enteric tube placement.  EXAM:  PORTABLE ABDOMEN - 1 VIEW  COMPARISON:  Lumbar spine radiographs performed 09/26/2011  FINDINGS: The patient's enteric tube is seen ending at the body of the stomach, with the sideport at the fundus of the stomach.  The visualized bowel gas pattern is grossly unremarkable, with scattered air noted in the colon. No free intra-abdominal air is identified, though evaluation for free air is limited on a single supine view and due to motion artifact. No acute osseous abnormalities are seen.  IMPRESSION: Enteric tube is seen ending at the body of the stomach, with the sideport at the fundus of the stomach.   Electronically Signed  By: Roanna Raider M.D.   On: 08/31/2013 05:45     ASSESSMENT / PLAN:  PULMONARY A: Acute respiratory failure 2nd to sepsis - Extubated 7/21 Hx of asthma - no wheezing on exam Likely OSA P:   Monitor resp status, tolerated post-extubation well Supplemental O2 PRN Repeat CXR if worsening IS Pulmicort, brovana with prn albuterol will need outpt sleep doctor and study Pulse ox May need trial empiric CPAP  CARDIOVASCULAR A:  Sepsis, without evidence of hypotension or shock - secondary to L-thigh abscess s/p OR drained Hx of HTN. Hypovolemia Elevated Lactate >> clearing, 3.6 >> 2.8 P:  Telemetry, maintain Monitor hemodynamics, stable BP, without need for pressor support Continue IVF maintenance  RENAL A:   AKI - in setting of sepsis, hypovolemia >> Improving Cr to 1.38 Hyponatremia - Improved to 132 P:   Monitor renal fx, urine outpt, electrolytes Volume pos goals to even  GASTROINTESTINAL A:   Nutrition Morbid obesity P:   Carb modified diet Switch to Protonix PO for SUP  HEMATOLOGIC A:   Leukocytosis - Mild improved 24 >> 21 P:  F/u CBC, WBC trend SQ heparin for DVT prevention No lovenox with body wt  INFECTIOUS A:   Sepsis, without evidence of hypotension or shock - Afebrile, improved WBC Bacteremia with GPC (x1 tube) vs contam L-thigh  Abscess (30x15x10cm) - s/p OR 7/20 irrigation / drainage No evidence nec fasc Noted fat dissolving per ccs  P:   - Continue Vanc / Zosyn- dose adjust per pharmacy  - Repeat blood culture x2 today, follow species of prior BCx with GPC - Antibiotics and cultures as above - Post-op care per CCS, dressing changes - if declines add clinda for toxin suppression -if staph aureus, will call ID, echo  ENDOCRINE A:   DM type II with hyperglycemia, likely secondary to acute abscess infection P:   - Improved CBGs - Off glucose stabilizer - Moderate SSI, transition to q 4 AC + QHS, may need to add low dose Lantus given acute infection - Not on home insulin, holding home metformin -lantus 5 daily  NEUROLOGIC A:   Acute encephalopathy 2nd to sepsis - Resolved P:   Off sedation Fentanyl PRN pain Pt will be needed  TODAY'S SUMMARY:  Transfer to SDU (TRH Primary), tolerated extubation (7/21) well without complications, hemodynamically stable (did not require pressors), continue broad spectrum antibiotics (Vanc / Zosyn) now with blood culture x 1 with GPC, re-draw cultures and monitor, trend WBC, PCT. Surgery following L-thigh wound, improving, dressing changes.  Saralyn Pilar, DO [facility], PGY-2  09/01/2013, 7:32 AM   To triad, med floor Call if needed  Mcarthur Rossetti. Tyson Alias, MD, FACP Pgr: [phone] [facility]

## 2013-09-01 NOTE — Progress Notes (Signed)
Inpatient Diabetes Program Recommendations  AACE/ADA: New Consensus Statement on Inpatient Glycemic Control (2013)  Target Ranges:  Prepandial:   less than 140 mg/dL      Peak postprandial:   less than 180 mg/dL (1-2 hours)      Critically ill patients:  140 - 180 mg/dL   Results for [name], [name] (MRN [id number]) as of 09/01/2013 09:14  Ref. Range 08/31/2013 09:48 08/31/2013 10:57 08/31/2013 12:04 08/31/2013 18:13 08/31/2013 19:25 09/01/2013 00:07 09/01/2013 04:19 09/01/2013 08:24  Glucose-Capillary Latest Range: 70-99 mg/dL 132126 (H) 440160 (H) 102172 (H) 288 (H) 331 (H) 237 (H) 270 (H) 197 (H)   Diabetes history: DM2 Outpatient Diabetes medications: Metformin 500 mg BID Current orders for Inpatient glycemic control: Novolog 0-15 units Q4H  Inpatient Diabetes Program Recommendations IV fluids: Please re-evaluate need for dextrose in IVF which are currently ordered as D5 1/2 NS @ 125 ml/hr. Insulin - Basal: Please consider ordering Lantus 27 units Q24H starting now (based on 135 kg x 0.2 units).  Note: Initial CBG 430 mg/dl and patient was placed on insulin drip. Patient was transitioned from IV insulin to SQ insulin on 08/31/13 around 10:30 am. No basal insulin was given at time of transition and CBGs have been consistently elevated since then despite Novolog moderate correction Q4H. Patient has received a total of Novolog 32 units over the past 18 hours for correction of glucose.  Noted A1C was 15.4% on 08/31/13. Please consider ordering Lantus 27 units Q24H and re-evaluate need for dextrose in IVF. Patient will likely need insulin at time of discharge. MD, please provide information on discharge plan for diabetes management so patient can be educated on insulin injections prior to discharge. Will continue to follow  Thanks, Linda PennerMarie Cyrena Kuchenbecker, RN, MSN, CCRN Diabetes Coordinator Inpatient Diabetes Program [phone] (Team Pager) [phone] ([facility] office) [phone] [facility] office)

## 2013-09-01 NOTE — Progress Notes (Signed)
Physical Therapy Wound Treatment and Evaluation Patient Details  Name: [name] MRN: [id number] Date of Birth: 03/12/1975  Today's Date: 09/01/2013 Time: 1320-1409 Time Calculation (min): 49 min  Subjective  Subjective: No pertinent comments during therapy-- pt rather lathargic Patient and Family Stated Goals: not mention---too lethargic Date of Onset:  (Developed a boil about a week prior to admission) Prior Treatments: None  Pain Score: Pain Score: 2   Wound Assessment  Wound / Incision (Open or Dehisced) 09/01/13 Incision - Open Groin Left;Anterior Large Left anterior thigh and groin post surgical debridement for abscess (Active)  Dressing Type Moist to dry;Gauze (Comment);Barrier Film (skin prep);ABD 09/01/2013  2:18 PM  Dressing Changed Changed 09/01/2013  2:18 PM  Dressing Status Clean;Intact 09/01/2013  2:18 PM  Dressing Change Frequency Twice a day 09/01/2013  2:18 PM  Site / Wound Assessment Yellow;Pink;Brown 09/01/2013  2:18 PM  % Wound base Red or Granulating 60% 09/01/2013  2:18 PM  % Wound base Yellow 40% 09/01/2013  2:18 PM  Peri-wound Assessment Intact 09/01/2013  2:18 PM  Wound Length (cm) 10 cm 09/01/2013  2:18 PM  Wound Width (cm) 27 cm 09/01/2013  2:18 PM  Wound Depth (cm) 4.5 cm 09/01/2013  2:18 PM  Margins Unattached edges (unapproximated) 09/01/2013  2:18 PM  Closure None 09/01/2013  2:18 PM  Drainage Amount Minimal 09/01/2013  2:18 PM  Drainage Description Serosanguineous 09/01/2013  2:18 PM  Treatment Cleansed;Debridement (Selective);Hydrotherapy (Pulse lavage);Packing (Saline gauze) 09/01/2013  2:18 PM     Incision (Closed) 08/30/13 Leg Left (Active)  Dressing Type Gauze (Comment) 09/01/2013  8:00 AM  Dressing Changed 09/01/2013  8:00 AM  Dressing Change Frequency Twice a day 09/01/2013  8:00 AM  Site / Wound Assessment Clean;Dry 09/01/2013  8:00 AM  Margins Unattached edges (unapproximated) 09/01/2013  8:00 AM  Closure Other (Comment) 09/01/2013  8:00 AM  Drainage  Amount Moderate 09/01/2013  8:00 AM  Drainage Description Serous 09/01/2013  8:00 AM  Treatment Cleansed 08/31/2013  8:00 AM   Hydrotherapy Pulsed lavage therapy - wound location: L ant. thigh and groin Pulsed Lavage with Suction (psi): 4 psi Pulsed Lavage with Suction - Normal Saline Used: 1000 mL Pulsed Lavage Tip: Tip with splash shield Selective Debridement Selective Debridement - Location: ant. thigh and groin on Left esp. most lateral and mdial sites Selective Debridement - Tools Used: Forceps;Scissors Selective Debridement - Tissue Removed: necrotic adipose and slough   Wound Assessment and Plan  Wound Therapy - Assess/Plan/Recommendations Wound Therapy - Clinical Statement: Pt will benefit from PLS and selective debridement of evolving necrosis to cleanse and control bacterial load to prepare for eventual closure. Wound Therapy - Functional Problem List: Wound limiting movement at this point Factors Delaying/Impairing Wound Healing: Diabetes Mellitus;Infection - systemic/local Hydrotherapy Plan: Debridement;Dressing change;Pulsatile lavage with suction;Patient/family education Wound Therapy - Frequency: 6X / week Wound Therapy - Current Recommendations: PT Wound Therapy - Follow Up Recommendations: Home health RN;Other (comment) Wound Plan: Continue PLS and debridement  to promote healing, granulation and proceed toward closure.  Wound Therapy Goals- Improve the function of patient's integumentary system by progressing the wound(s) through the phases of wound healing (inflammation - proliferation - remodeling) by: Decrease Necrotic Tissue to: 10 Decrease Necrotic Tissue - Progress: Goal set today Increase Granulation Tissue to: 90 Increase Granulation Tissue - Progress: Goal set today Improve Drainage Characteristics: Min;Serous Improve Drainage Characteristics - Progress: Goal set today Goals/treatment plan/discharge plan were made with and agreed upon by patient/family:  Yes Time For Goal Achievement:  7 days Wound Therapy - Potential for Goals: Good  Goals will be updated until maximal potential achieved or discharge criteria met.  Discharge criteria: when goals achieved, discharge from hospital, MD decision/surgical intervention, no progress towards goals, refusal/missing three consecutive treatments without notification or medical reason.  GP     Worley Radermacher, Tessie Fass 09/01/2013, 2:38 PM  09/01/2013  Donnella Sham, [place] [phone]  (pager)

## 2013-09-01 NOTE — Progress Notes (Signed)
I have seen and examined the pt and agree with PA-Osborne's progress note. Con't dressing changes Abx

## 2013-09-01 NOTE — Progress Notes (Signed)
Patient ID: Herold HarmsDavenia [name], female   DOB: 05/24/1975, 38 y.o.   MRN: [id number] 2 Days Post-Op  Subjective: Patient extubated and arousable.  Objective: Vital signs in last 24 hours: Temp:  [97.4 F (36.3 C)-100.7 F (38.2 C)] 98.7 F (37.1 C) (07/22 0400) Pulse Rate:  [83-107] 84 (07/22 0700) Resp:  [0-28] 22 (07/22 0700) BP: (113-157)/(65-119) 117/65 mmHg (07/22 0700) SpO2:  [93 %-100 %] 97 % (07/22 0800) FiO2 (%):  [40 %] 40 % (07/21 0838)    Intake/Output from previous day: 07/21 0701 - 07/22 0700 In: 2140 [P.O.:240; I.V.:1750; IV Piggyback:150] Out: 1190 [Urine:1190] Intake/Output this shift:    PE: Skin: Left thigh wound is undressed. The wound remains mostly clean however she does have a new area of some dishwater type fluid along the superior lateral border. The tissue finger fractures slightly but that's it. There is a little bit of necrotic fat aat the most medial portion of her wound. Otherwise this is overall relatively clean.   Lab Results:   Recent Labs  08/30/13 1429 08/31/13 0220  WBC 24.0* 21.3*  HGB 11.6* 9.9*  HCT 33.6* 29.3*  PLT 173 185   BMET  Recent Labs  08/31/13 2208 09/01/13 0406  NA 131* 132*  K 4.0 4.0  CL 100 97  CO2 20 20  GLUCOSE 297* 266*  BUN 43* 42*  CREATININE 1.41* 1.38*  CALCIUM 7.4* 7.7*   PT/INR No results found for this basename: LABPROT, INR,  in the last 72 hours CMP     Component Value Date/Time   NA 132* 09/01/2013 0406   K 4.0 09/01/2013 0406   CL 97 09/01/2013 0406   CO2 20 09/01/2013 0406   GLUCOSE 266* 09/01/2013 0406   BUN 42* 09/01/2013 0406   CREATININE 1.38* 09/01/2013 0406   CALCIUM 7.7* 09/01/2013 0406   PROT 6.9 08/31/2013 0220   ALBUMIN 1.5* 08/31/2013 0220   AST 27 08/31/2013 0220   ALT 19 08/31/2013 0220   ALKPHOS 154* 08/31/2013 0220   BILITOT 0.2* 08/31/2013 0220   GFRNONAA 48* 09/01/2013 0406   GFRAA 56* 09/01/2013 0406   Lipase  No results found for this basename: lipase        Studies/Results: Portable Chest Xray In Am  08/31/2013   CLINICAL DATA:  Respiratory failure.  EXAM: PORTABLE CHEST - 1 VIEW  COMPARISON:  08/30/2013.  FINDINGS: The endotracheal tube is 4 cm above the carina. The NG tube is coursing down the esophagus and into the stomach. The heart is enlarged but unchanged. Persistent mild perihilar edema and probable small effusions.  IMPRESSION: Stable support apparatus.  Persistent perihilar edema and areas of atelectasis.   Electronically Signed   By: Loralie ChampagneMark  Gallerani M.D.   On: 08/31/2013 07:29   Dg Chest Port 1 View  08/30/2013   CLINICAL DATA:  Endotracheal tube placement.  EXAM: PORTABLE CHEST - 1 VIEW  COMPARISON:  08/30/2013 and 02/21/2013.  FINDINGS: 2214 hr. Endotracheal tube appears unchanged, within the mid trachea. There is improved aeration of the lungs with residual patchy left greater than right basilar airspace opacities. There is no pleural effusion or pneumothorax. The heart size and mediastinal contours are stable.  IMPRESSION: Interval improved aeration of the lung bases. Endotracheal tube appears unchanged within the midtrachea.   Electronically Signed   By: Roxy HorsemanBill  Veazey M.D.   On: 08/30/2013 23:16   Portable Chest Xray  08/30/2013   CLINICAL DATA:  Endotracheal tube placement  EXAM: PORTABLE CHEST - 1  VIEW  COMPARISON:  Chest radiograph 02/21/2013  FINDINGS: Markedly low lung volumes limit evaluation. The endotracheal tube terminates within the distal trachea approximately 5 mm superior to the carina. Cardiomegaly. Heterogeneous lower lung opacities, left greater than right. No pleural effusion or pneumothorax.  IMPRESSION: Endotracheal tube terminates within the distal trachea. Recommend retraction and re-evaluation with repeat radiograph.  Scattered heterogeneous opacities favored to represent atelectasis in the setting of markedly low lung volumes.  Discussed with Nurse Clarene Critchley at 8:43 p.m. on 08/30/2013.   Electronically  Signed   By: Annia Belt M.D.   On: 08/30/2013 20:44   Dg Abd Portable 1v  08/31/2013   CLINICAL DATA:  Enteric tube placement.  EXAM: PORTABLE ABDOMEN - 1 VIEW  COMPARISON:  Lumbar spine radiographs performed 09/26/2011  FINDINGS: The patient's enteric tube is seen ending at the body of the stomach, with the sideport at the fundus of the stomach.  The visualized bowel gas pattern is grossly unremarkable, with scattered air noted in the colon. No free intra-abdominal air is identified, though evaluation for free air is limited on a single supine view and due to motion artifact. No acute osseous abnormalities are seen.  IMPRESSION: Enteric tube is seen ending at the body of the stomach, with the sideport at the fundus of the stomach.   Electronically Signed   By: Roanna Raider M.D.   On: 08/31/2013 05:45    Anti-infectives: Anti-infectives   Start     Dose/Rate Route Frequency Ordered Stop   09/01/13 1100  vancomycin (VANCOCIN) 1,750 mg in sodium chloride 0.9 % 500 mL IVPB     1,750 mg 250 mL/hr over 120 Minutes Intravenous Every 24 hours 08/31/13 1418     08/31/13 2000  piperacillin-tazobactam (ZOSYN) IVPB 3.375 g     3.375 g 12.5 mL/hr over 240 Minutes Intravenous Every 8 hours 08/31/13 1026     08/31/13 1200  vancomycin (VANCOCIN) 1,750 mg in sodium chloride 0.9 % 500 mL IVPB     1,750 mg 250 mL/hr over 120 Minutes Intravenous  Once 08/31/13 1036 08/31/13 1303   08/31/13 1100  piperacillin-tazobactam (ZOSYN) IVPB 3.375 g     3.375 g 12.5 mL/hr over 240 Minutes Intravenous  Once 08/31/13 1026 08/31/13 1504   08/30/13 1515  [MAR Hold]  vancomycin (VANCOCIN) IVPB 1000 mg/200 mL premix     (On MAR Hold since 08/30/13 1616)   1,000 mg 200 mL/hr over 60 Minutes Intravenous  Once 08/30/13 1510 08/30/13 1647   08/30/13 1515  piperacillin-tazobactam (ZOSYN) IVPB 3.375 g     3.375 g 100 mL/hr over 30 Minutes Intravenous  Once 08/30/13 1510 08/30/13 1601       Assessment/Plan  1. POD2, status  post irrigation and debridement of left thigh abscess/necrotic tissue 2. DKA, resolved 3. Diabetes mellitus 4. Hypertension 5. Noncompliance  Plan: 1. Continue normal saline wet to dry dressing changes to this large wound twice a day. Because of some of these new areas seen today, hydrotherapy will be ordered to help keep this wound clean. Still no further need for OR debridement as needed   2. Continue vancomycin and Zosyn for antibiotic therapy.   LOS: 2 days    Abie Cheek E 09/01/2013, 8:07 AM Pager: [phone]

## 2013-09-02 LAB — BASIC METABOLIC PANEL
Anion gap: 13 (ref 5–15)
BUN: 38 mg/dL — AB (ref 6–23)
CHLORIDE: 101 meq/L (ref 96–112)
CO2: 22 mEq/L (ref 19–32)
Calcium: 8 mg/dL — ABNORMAL LOW (ref 8.4–10.5)
Creatinine, Ser: 1.15 mg/dL — ABNORMAL HIGH (ref 0.50–1.10)
GFR calc Af Amer: 70 mL/min — ABNORMAL LOW (ref >90)
GFR, EST NON AFRICAN AMERICAN: 60 mL/min — AB (ref >90)
Glucose, Bld: 208 mg/dL — ABNORMAL HIGH (ref 70–99)
Potassium: 4 mEq/L (ref 3.7–5.3)
Sodium: 136 mEq/L — ABNORMAL LOW (ref 137–147)

## 2013-09-02 LAB — BLOOD GAS, ARTERIAL
ACID-BASE DEFICIT: 0.9 mmol/L (ref 0.0–2.0)
Bicarbonate: 22.9 mEq/L (ref 20.0–24.0)
Drawn by: 277331
FIO2: 0.21 %
O2 Saturation: 94.1 %
PH ART: 7.421 (ref 7.350–7.450)
Patient temperature: 99
TCO2: 24 mmol/L (ref 0–100)
pCO2 arterial: 36.1 mmHg (ref 35.0–45.0)
pO2, Arterial: 67.8 mmHg — ABNORMAL LOW (ref 80.0–100.0)

## 2013-09-02 LAB — GLUCOSE, CAPILLARY
GLUCOSE-CAPILLARY: 172 mg/dL — AB (ref 70–99)
GLUCOSE-CAPILLARY: 219 mg/dL — AB (ref 70–99)
GLUCOSE-CAPILLARY: 294 mg/dL — AB (ref 70–99)
Glucose-Capillary: 197 mg/dL — ABNORMAL HIGH (ref 70–99)
Glucose-Capillary: 195 mg/dL — ABNORMAL HIGH (ref 70–99)
Glucose-Capillary: 171 mg/dL — ABNORMAL HIGH (ref 70–99)

## 2013-09-02 LAB — CBC
HEMATOCRIT: 28.1 % — AB (ref 36.0–46.0)
Hemoglobin: 9.3 g/dL — ABNORMAL LOW (ref 12.0–15.0)
MCH: 26.1 pg (ref 26.0–34.0)
MCHC: 33.1 g/dL (ref 30.0–36.0)
MCV: 78.7 fL (ref 78.0–100.0)
Platelets: 270 10*3/uL (ref 150–400)
RBC: 3.57 MIL/uL — ABNORMAL LOW (ref 3.87–5.11)
RDW: 14.1 % (ref 11.5–15.5)
WBC: 18.1 10*3/uL — ABNORMAL HIGH (ref 4.0–10.5)

## 2013-09-02 LAB — PROCALCITONIN: Procalcitonin: 14.04 ng/mL

## 2013-09-02 LAB — WOUND CULTURE: Culture: NO GROWTH

## 2013-09-02 MED ORDER — SODIUM CHLORIDE 0.9 % IV SOLN
INTRAVENOUS | Status: DC
  Administered 2013-09-02 – 2013-09-04 (×3): via INTRAVENOUS

## 2013-09-02 MED ORDER — OXYCODONE HCL 5 MG PO TABS: 5.0000 mg | ORAL_TABLET | ORAL | Status: DC | PRN

## 2013-09-02 MED ORDER — ACETAMINOPHEN 325 MG PO TABS
650.0000 mg | ORAL_TABLET | Freq: Four times a day (QID) | ORAL | Status: DC | PRN
  Administered 2013-09-06 – 2013-09-12 (×17): 650 mg via ORAL
  Filled 2013-09-02 (×16): qty 2

## 2013-09-02 MED ORDER — FENTANYL CITRATE 0.05 MG/ML IJ SOLN: 25.0000 ug | Freq: Four times a day (QID) | INTRAMUSCULAR | Status: DC | PRN

## 2013-09-02 MED ORDER — INSULIN ASPART 100 UNIT/ML ~~LOC~~ SOLN
0.0000 [IU] | Freq: Three times a day (TID) | SUBCUTANEOUS | Status: DC
  Administered 2013-09-02: 4 [IU] via SUBCUTANEOUS
  Administered 2013-09-03: 7 [IU] via SUBCUTANEOUS
  Administered 2013-09-03: 4 [IU] via SUBCUTANEOUS
  Administered 2013-09-03 – 2013-09-04 (×2): 7 [IU] via SUBCUTANEOUS
  Administered 2013-09-04: 14:00:00 via SUBCUTANEOUS
  Administered 2013-09-04: 7 [IU] via SUBCUTANEOUS
  Administered 2013-09-05 (×2): 4 [IU] via SUBCUTANEOUS
  Administered 2013-09-05: 7 [IU] via SUBCUTANEOUS
  Administered 2013-09-06: 4 [IU] via SUBCUTANEOUS
  Administered 2013-09-06: 7 [IU] via SUBCUTANEOUS
  Administered 2013-09-06: 4 [IU] via SUBCUTANEOUS
  Administered 2013-09-08: 3 [IU] via SUBCUTANEOUS
  Administered 2013-09-09 (×2): 4 [IU] via SUBCUTANEOUS
  Administered 2013-09-10: 7 [IU] via SUBCUTANEOUS
  Administered 2013-09-12: 3 [IU] via SUBCUTANEOUS

## 2013-09-02 MED ORDER — HYDRALAZINE HCL 20 MG/ML IJ SOLN
10.0000 mg | Freq: Four times a day (QID) | INTRAMUSCULAR | Status: DC | PRN
Start: 2013-09-02 — End: 2013-09-13
  Administered 2013-09-03 – 2013-09-12 (×7): 10 mg via INTRAVENOUS
  Filled 2013-09-02 (×7): qty 1

## 2013-09-02 MED ORDER — INSULIN ASPART 100 UNIT/ML ~~LOC~~ SOLN
0.0000 [IU] | Freq: Three times a day (TID) | SUBCUTANEOUS | Status: DC
  Administered 2013-09-02 (×2): 3 [IU] via SUBCUTANEOUS

## 2013-09-02 MED ORDER — INSULIN ASPART 100 UNIT/ML ~~LOC~~ SOLN
4.0000 [IU] | Freq: Three times a day (TID) | SUBCUTANEOUS | Status: DC
  Administered 2013-09-02 – 2013-09-03 (×5): 4 [IU] via SUBCUTANEOUS

## 2013-09-02 MED ORDER — INSULIN GLARGINE 100 UNIT/ML ~~LOC~~ SOLN
20.0000 [IU] | Freq: Every day | SUBCUTANEOUS | Status: DC
  Administered 2013-09-02 – 2013-09-03 (×2): 20 [IU] via SUBCUTANEOUS
  Filled 2013-09-02 (×2): qty 0.2

## 2013-09-02 NOTE — Progress Notes (Signed)
Physical Therapy Wound Treatment Patient Details  Name: [name] MRN: [id number] Date of Birth: 05-15-1975  Today's Date: 09/02/2013 Time: 3567-0141 Time Calculation (min): 42 min  Subjective  Subjective: No pertinent comments during therapy-- pt rather lathargic Patient and Family Stated Goals: not mention---too lethargic Date of Onset:  (Developed a boil about a week prior to admission) Prior Treatments: None  Pain Score: Pain Score: Asleep; stirring briefly during end of PLS and end of dressing application  Wound Assessment  Wound / Incision (Open or Dehisced) 09/01/13 Incision - Open Groin Left;Anterior Large Left anterior thigh and groin post surgical debridement for abscess (Active)  Dressing Type Gauze (Comment);Barrier Film (skin prep);ABD;Moist to moist;Tape dressing 09/02/2013 10:31 AM  Dressing Changed Changed 09/02/2013 10:31 AM  Dressing Status Clean;Intact;Dry 09/02/2013 10:31 AM  Dressing Change Frequency Twice a day 09/02/2013 10:31 AM  Site / Wound Assessment Yellow;Pink;Brown 09/02/2013 10:31 AM  % Wound base Red or Granulating 60% 09/02/2013 10:31 AM  % Wound base Yellow 10% 09/02/2013 10:31 AM  % Wound base Black 0% 09/02/2013 10:31 AM  % Wound base Other (Comment) 30% 09/02/2013 10:31 AM  Peri-wound Assessment Intact 09/02/2013 10:31 AM  Wound Length (cm) 10 cm 09/01/2013  2:18 PM  Wound Width (cm) 27 cm 09/01/2013  2:18 PM  Wound Depth (cm) 4.5 cm 09/01/2013  2:18 PM  Margins Unattached edges (unapproximated) 09/02/2013 10:31 AM  Closure None 09/02/2013 10:31 AM  Drainage Amount Copious 09/02/2013 10:31 AM  Drainage Description Serosanguineous 09/02/2013 10:31 AM  Non-staged Wound Description Full thickness 09/02/2013 10:31 AM  Treatment Hydrotherapy (Pulse lavage);Packing (Saline gauze) 09/02/2013 10:31 AM      Hydrotherapy Pulsed lavage therapy - wound location: L ant-medial thigh  Pulsed Lavage with Suction (psi): 4 psi Pulsed Lavage with Suction - Normal Saline  Used: 1000 mL Pulsed Lavage Tip: Tip with splash shield Selective Debridement Selective Debridement - Location: anterior-medial thigh on Left  Selective Debridement - Tools Used: Forceps;Scissors Selective Debridement - Tissue Removed: necrotic adipose and slough   Wound Assessment and Plan  Wound Therapy - Assess/Plan/Recommendations Wound Therapy - Clinical Statement: Pt will benefit from PLS and selective debridement of evolving necrosis to cleanse and control bacterial load to prepare for eventual closure. Wound Therapy - Functional Problem List: Wound limiting movement at this point Factors Delaying/Impairing Wound Healing: Diabetes Mellitus;Infection - systemic/local Hydrotherapy Plan: Debridement;Dressing change;Pulsatile lavage with suction;Patient/family education Wound Therapy - Frequency: 6X / week Wound Therapy - Current Recommendations: PT Wound Therapy - Follow Up Recommendations: Home health RN;Other (comment) Wound Plan: Continue PLS and debridement  to promote healing, granulation and proceed toward closure.  Wound Therapy Goals- Improve the function of patient's integumentary system by progressing the wound(s) through the phases of wound healing (inflammation - proliferation - remodeling) by: Decrease Necrotic Tissue to: 10 Decrease Necrotic Tissue - Progress: Progressing toward goal Increase Granulation Tissue to: 90 Increase Granulation Tissue - Progress: Progressing toward goal Improve Drainage Characteristics: Min;Serous Improve Drainage Characteristics - Progress: Progressing toward goal  Goals will be updated until maximal potential achieved or discharge criteria met.  Discharge criteria: when goals achieved, discharge from hospital, MD decision/surgical intervention, no progress towards goals, refusal/missing three consecutive treatments without notification or medical reason.  GP     Omega Durante 09/02/2013, 10:39 AM Pager [phone]

## 2013-09-02 NOTE — Progress Notes (Signed)
Patient ID: [name], female   DOB: [birth year], 38 y.o.   MRN: [id number]         PATIENT DETAILS Name: [name] Age: 38 y.o. Sex: female Date of Birth: March 14, 1975 Admit Date: 08/30/2013 Admitting Physician Storm Frisk, MD WUJ:[email or web address],YNWGN, MD  Subjective: Patient is a morbidly obese 38 year-old female with a PMH of diabetes and asthma who was admitted for a left thigh abscess complicated by sepsis, AKI, and hyperglycemia. Today she is extremely lethargic and is  Aroused momentarily by a sternal rub. She responds to pain and is accompanied by her sister and husband. Her husband states she is not acting like her normal self and has not been awake and alert since before debridement yesterday.   Objective: Gen Exam: Asleep and uneasily aroused. Nonverbal.  Neck: Supple, No JVD.   Chest: B/L Clear, moving air bilaterally CVS: S1 S2 Regular, no murmurs.  Abdomen: soft, BS +, non tender, non distended. Extremities: no edema, lower extremities warm to touch. Neurologic: Unable to perform. Skin: No Rash.   Wounds: Large left thigh-inguinal surgical wound (12"x6") as as result of abscess drainage and debridement. Clean wound bed with tunneling toward medial thigh.  One 5 cm area of necrotic tissue underneath pannus.   Assessment/Plan:  Acute respiratory failure 2nd to sepsis with history of asthma.  Secondary to severe sepsis from thigh abscess. Patient no longer on vent support; supplement 2L oxygen nasal cannula SpO2 94% Follow-up ABG reassuring Hold fentanyl and narcotics for now.  When aroused, continue pulmicort, brovana with prn albuterol as needed  Sepsis.  Patient continues to meet SIRS criteria (RR>20, WBC >12,000) Continue to resuscitate with IVF Continue IV antibiotics- zosyn and vancomycin Monitor hemodynamics   Acute encephalopathy secondary to sepsis.   Lethargic.  Will give supportive care NPO until awake and alert. [place] Agitation and Sedation Scale  goal: -1,  Currently -4  Left thigh wound s/p surgical drainage and debridement of abscess   Blood cultures clean at this time Urine culture negative. Wound culture shows no growth.  Gram stain shows abundant gm neg rods and moderate GPC in pairs. Continue zosyn and vancomycin. Plan to d/c Vanc tomorrow. Hydrotherapy Post-op care per [facility]   Acute kidney injury  Due to sepsis and acute infection. Most recent creatinine 1.15, trending downward Monitor creatinine   Hyponatremia.  Now resolved with IVF.  Leukocytosis.  Trending down, most recent level 18.1 Follow CBC   Microcytic Anemia Baseline appears to be 13 - 14. Hemoglobin trending down to 9.3 Hematocrit trending down to 28.1 Will check an anemia panel 7/24.  Diabetes Mellitus type II with hyperglycemia.  Last A1C 15 CBGs persistently high; this morning's reading (7/23) at 197. Continue to follow routinely. Started ssi resistant, meal coverage and lantus qhs.  Will monitor and titrate as needed Patient to receive insulin education prior to d/c.  High triglycerides Serum triglycerides 550. Will start gemfibrazil when patient is alert enough to take POs.  Disposition: Remain inpatient  DVT Prophylaxis: Prophylactic Heparin  Code Status: Full code   Family Communication Husband and sister at bedside.   Procedures:  Hydrotherapy  CONSULTS:  None   MEDICATIONS: Scheduled Meds: . antiseptic oral rinse  15 mL Mouth Rinse QID  . arformoterol  15 mcg Nebulization Q12H  . budesonide (PULMICORT) nebulizer solution  0.25 mg Nebulization Q12H  . chlorhexidine  15 mL Mouth Rinse BID  . heparin  5,000 Units Subcutaneous 3 times per day  . hydrALAZINE  50 mg Oral 3 times per day  . insulin aspart  0-20 Units Subcutaneous TID WC  . insulin aspart  4 Units Subcutaneous TID WC  . insulin glargine  20 Units Subcutaneous Daily  . piperacillin-tazobactam (ZOSYN)  IV  3.375 g Intravenous Q8H  . vancomycin  1,250 mg  Intravenous Q12H   Continuous Infusions:  PRN Meds:.acetaminophen, acetaminophen, albuterol, fentaNYL, oxyCODONE  Antibiotics: Anti-infectives   Start     Dose/Rate Route Frequency Ordered Stop   09/02/13 0800  vancomycin (VANCOCIN) 1,250 mg in sodium chloride 0.9 % 250 mL IVPB     1,250 mg 166.7 mL/hr over 90 Minutes Intravenous Every 12 hours 09/01/13 1252     09/01/13 1100  vancomycin (VANCOCIN) 1,750 mg in sodium chloride 0.9 % 500 mL IVPB  Status:  Discontinued     1,750 mg 250 mL/hr over 120 Minutes Intravenous Every 24 hours 08/31/13 1418 09/01/13 1252   08/31/13 2000  piperacillin-tazobactam (ZOSYN) IVPB 3.375 g     3.375 g 12.5 mL/hr over 240 Minutes Intravenous Every 8 hours 08/31/13 1026     08/31/13 1200  vancomycin (VANCOCIN) 1,750 mg in sodium chloride 0.9 % 500 mL IVPB     1,750 mg 250 mL/hr over 120 Minutes Intravenous  Once 08/31/13 1036 08/31/13 1303   08/31/13 1100  piperacillin-tazobactam (ZOSYN) IVPB 3.375 g     3.375 g 12.5 mL/hr over 240 Minutes Intravenous  Once 08/31/13 1026 08/31/13 1504   08/30/13 1515  [MAR Hold]  vancomycin (VANCOCIN) IVPB 1000 mg/200 mL premix     (On MAR Hold since 08/30/13 1616)   1,000 mg 200 mL/hr over 60 Minutes Intravenous  Once 08/30/13 1510 08/30/13 1647   08/30/13 1515  piperacillin-tazobactam (ZOSYN) IVPB 3.375 g     3.375 g 100 mL/hr over 30 Minutes Intravenous  Once 08/30/13 1510 08/30/13 1601       PHYSICAL EXAM: Vital signs in last 24 hours: Filed Vitals:   09/02/13 0500 09/02/13 0635 09/02/13 0913 09/02/13 1350  BP: 130/70   160/90  Pulse: 90   84  Temp: 99 F (37.2 C)   98.8 F (37.1 C)  TempSrc: Axillary   Oral  Resp: 22   20  Weight:  144.8 kg (319 lb 3.6 oz)    SpO2: 94%  97% 97%    Weight change:  Filed Weights   08/30/13 2016 09/01/13 2134 09/02/13 0635  Weight: 135.5 kg (298 lb 11.6 oz) 144.8 kg (319 lb 3.6 oz) 144.8 kg (319 lb 3.6 oz)   Body mass index is 54.77 kg/(m^2).     Intake/Output  from previous day:  Intake/Output Summary (Last 24 hours) at 09/02/13 1531 Last data filed at 09/02/13 0453  Gross per 24 hour  Intake     50 ml  Output   1100 ml  Net  -1050 ml     LAB RESULTS: CBC  Recent Labs Lab 08/30/13 1429 08/31/13 0220 09/01/13 0925 09/02/13 0530  WBC 24.0* 21.3* 24.0* 18.1*  HGB 11.6* 9.9* 10.1* 9.3*  HCT 33.6* 29.3* 30.2* 28.1*  PLT 173 185 237 270  MCV 78.3 78.8 80.7 78.7  MCH 27.0 26.6 27.0 26.1  MCHC 34.5 33.8 33.4 33.1  RDW 13.3 13.7 14.1 14.1  LYMPHSABS 2.2  --   --   --   MONOABS 1.4*  --   --   --   EOSABS 0.0  --   --   --   BASOSABS 0.0  --   --   --  Chemistries   Recent Labs Lab 08/30/13 1429  08/31/13 0220  08/31/13 1415 08/31/13 1825 08/31/13 2208 09/01/13 0406 09/02/13 0530  NA 125*  --  131*  < > 133* 130* 131* 132* 136*  K 4.6  --  3.8  < > 4.3 4.4 4.0 4.0 4.0  CL 85*  --  94*  < > 99 93* 100 97 101  CO2 17*  --  21  < > 21 21 20 20 22   GLUCOSE 430*  < > 218*  < > 208* 328* 297* 266* 208*  BUN 48*  --  50*  < > 45* 43* 43* 42* 38*  CREATININE 1.85*  --  1.88*  < > 1.50* 1.35* 1.41* 1.38* 1.15*  CALCIUM 8.5  --  7.5*  < > 7.7* 7.6* 7.4* 7.7* 8.0*  MG  --   --  2.6*  --   --   --   --   --   --   < > = values in this interval not displayed.  CBG:  Recent Labs Lab 09/01/13 2156 09/02/13 0027 09/02/13 0440 09/02/13 0808 09/02/13 1200  GLUCAP 312* 294* 219* 197* 195*     Recent Labs  08/31/13 1201  HGBA1C 15.4*    Recent Labs  08/30/13 2110  TRIG 550*    MICROBIOLOGY: Recent Results (from the past 240 hour(s))  CULTURE, BLOOD (ROUTINE X 2)     Status: None   Collection Time    08/30/13  2:29 PM      Result Value Ref Range Status   Specimen Description BLOOD LEFT ANTECUBITAL   Final   Special Requests     Final   Value: BOTTLES DRAWN AEROBIC AND ANAEROBIC 6CC AER 5CC ANA   Culture  Setup Time     Final   Value: 08/30/2013 19:36     Performed at [facility]   Culture     Final    Value:        BLOOD CULTURE RECEIVED NO GROWTH TO DATE CULTURE WILL BE HELD FOR 5 DAYS BEFORE ISSUING A FINAL NEGATIVE REPORT     Performed at [facility]   Report Status PENDING   Incomplete  CULTURE, BLOOD (ROUTINE X 2)     Status: None   Collection Time    08/30/13  2:41 PM      Result Value Ref Range Status   Specimen Description BLOOD LEFT HAND   Final   Special Requests BOTTLES DRAWN AEROBIC AND ANAEROBIC 6CC   Final   Culture  Setup Time     Final   Value: 08/30/2013 19:36     Performed at [facility]   Culture     Final   Value: STAPHYLOCOCCUS SPECIES (COAGULASE NEGATIVE)     Note: THE SIGNIFICANCE OF ISOLATING THIS ORGANISM FROM A SINGLE SET OF BLOOD CULTURES WHEN MULTIPLE SETS ARE DRAWN IS UNCERTAIN. PLEASE NOTIFY THE MICROBIOLOGY DEPARTMENT WITHIN ONE WEEK IF SPECIATION AND SENSITIVITIES ARE REQUIRED.     Note: Gram Stain Report Called to,Read Back By and Verified With: SARA G@12 :54PM ON 08/31/13 BY DANTS     Performed at [facility]   Report Status 09/01/2013 FINAL   Final  WOUND CULTURE     Status: None   Collection Time    08/30/13  5:06 PM      Result Value Ref Range Status   Specimen Description WOUND THIGH LEFT   Final   Special Requests PATIENT  ON FOLLOWING VANCOMYCIN, ZOSYN   Final   Gram Stain     Final   Value: RARE WBC PRESENT, PREDOMINANTLY MONONUCLEAR     NO SQUAMOUS EPITHELIAL CELLS SEEN     ABUNDANT GRAM NEGATIVE RODS     FEW GRAM POSITIVE COCCI IN PAIRS     IN CLUSTERS FEW GRAM POSITIVE RODS   Culture     Final   Value: NO GROWTH 2 DAYS     Performed at [facility]   Report Status 09/02/2013 FINAL   Final  ANAEROBIC CULTURE     Status: None   Collection Time    08/30/13  5:06 PM      Result Value Ref Range Status   Specimen Description WOUND THIGH LEFT   Final   Special Requests PATIENT ON FOLLOWING VANCOMYCIN, ZOSYN   Final   Gram Stain     Final   Value: RARE WBC PRESENT, PREDOMINANTLY MONONUCLEAR     NO  SQUAMOUS EPITHELIAL CELLS SEEN     ABUNDANT GRAM NEGATIVE RODS     FEW GRAM POSITIVE COCCI IN PAIRS     IN CLUSTERS FEW GRAM POSITIVE RODS   Culture     Final   Value: NO ANAEROBES ISOLATED; CULTURE IN PROGRESS FOR 5 DAYS     Performed at [facility]   Report Status PENDING   Incomplete  MRSA PCR SCREENING     Status: None   Collection Time    08/30/13  7:11 PM      Result Value Ref Range Status   MRSA by PCR NEGATIVE  NEGATIVE Final   Comment:            The GeneXpert MRSA Assay (FDA     approved for NASAL specimens     only), is one component of a     comprehensive MRSA colonization     surveillance program. It is not     intended to diagnose MRSA     infection nor to guide or     monitor treatment for     MRSA infections.  URINE CULTURE     Status: None   Collection Time    08/30/13  7:12 PM      Result Value Ref Range Status   Specimen Description URINE, CATHETERIZED   Final   Special Requests NONE   Final   Culture  Setup Time     Final   Value: 08/31/2013 00:49     Performed at [facility] Count     Final   Value: NO GROWTH     Performed at [facility]   Culture     Final   Value: NO GROWTH     Performed at [facility]   Report Status 09/01/2013 FINAL   Final  CULTURE, BLOOD (ROUTINE X 2)     Status: None   Collection Time    09/01/13 11:10 AM      Result Value Ref Range Status   Specimen Description BLOOD LEFT ANTECUBITAL   Final   Special Requests BOTTLES DRAWN AEROBIC ONLY [facility]   Final   Culture  Setup Time     Final   Value: 09/01/2013 14:01     Performed at [facility]   Culture     Final   Value:        BLOOD CULTURE RECEIVED NO GROWTH TO DATE CULTURE WILL BE HELD FOR 5 DAYS BEFORE ISSUING  A FINAL NEGATIVE REPORT     Performed at [facility]   Report Status PENDING   Incomplete  CULTURE, BLOOD (ROUTINE X 2)     Status: None   Collection Time    09/01/13 11:20 AM      Result Value Ref Range  Status   Specimen Description BLOOD LEFT HAND   Final   Special Requests BOTTLES DRAWN AEROBIC ONLY [facility]   Final   Culture  Setup Time     Final   Value: 09/01/2013 14:01     Performed at [facility]   Culture     Final   Value:        BLOOD CULTURE RECEIVED NO GROWTH TO DATE CULTURE WILL BE HELD FOR 5 DAYS BEFORE ISSUING A FINAL NEGATIVE REPORT     Performed at [facility]   Report Status PENDING   Incomplete    RADIOLOGY STUDIES/RESULTS: Portable Chest Xray In Am  08/31/2013   CLINICAL DATA:  Respiratory failure.  EXAM: PORTABLE CHEST - 1 VIEW  COMPARISON:  08/30/2013.  FINDINGS: The endotracheal tube is 4 cm above the carina. The NG tube is coursing down the esophagus and into the stomach. The heart is enlarged but unchanged. Persistent mild perihilar edema and probable small effusions.  IMPRESSION: Stable support apparatus.  Persistent perihilar edema and areas of atelectasis.   Electronically Signed   By: Loralie Champagne M.D.   On: 08/31/2013 07:29   Dg Chest Port 1 View  08/30/2013   CLINICAL DATA:  Endotracheal tube placement.  EXAM: PORTABLE CHEST - 1 VIEW  COMPARISON:  08/30/2013 and 02/21/2013.  FINDINGS: 2214 hr. Endotracheal tube appears unchanged, within the mid trachea. There is improved aeration of the lungs with residual patchy left greater than right basilar airspace opacities. There is no pleural effusion or pneumothorax. The heart size and mediastinal contours are stable.  IMPRESSION: Interval improved aeration of the lung bases. Endotracheal tube appears unchanged within the midtrachea.   Electronically Signed   By: Roxy Horseman M.D.   On: 08/30/2013 23:16   Portable Chest Xray  08/30/2013   CLINICAL DATA:  Endotracheal tube placement  EXAM: PORTABLE CHEST - 1 VIEW  COMPARISON:  Chest radiograph 02/21/2013  FINDINGS: Markedly low lung volumes limit evaluation. The endotracheal tube terminates within the distal trachea approximately 5 mm superior to the carina.  Cardiomegaly. Heterogeneous lower lung opacities, left greater than right. No pleural effusion or pneumothorax.  IMPRESSION: Endotracheal tube terminates within the distal trachea. Recommend retraction and re-evaluation with repeat radiograph.  Scattered heterogeneous opacities favored to represent atelectasis in the setting of markedly low lung volumes.  Discussed with Nurse Clarene Critchley at 8:43 p.m. on 08/30/2013.   Electronically Signed   By: Annia Belt M.D.   On: 08/30/2013 20:44   Dg Abd Portable 1v  08/31/2013   CLINICAL DATA:  Enteric tube placement.  EXAM: PORTABLE ABDOMEN - 1 VIEW  COMPARISON:  Lumbar spine radiographs performed 09/26/2011  FINDINGS: The patient's enteric tube is seen ending at the body of the stomach, with the sideport at the fundus of the stomach.  The visualized bowel gas pattern is grossly unremarkable, with scattered air noted in the colon. No free intra-abdominal air is identified, though evaluation for free air is limited on a single supine view and due to motion artifact. No acute osseous abnormalities are seen.  IMPRESSION: Enteric tube is seen ending at the body of the stomach, with the sideport at  the fundus of the stomach.   Electronically Signed   By: Roanna Raider M.D.   On: 08/31/2013 05:45    Calton Dach [facility] Pager:[phone]  If 7PM-7AM, please contact night-coverage [email or web address] Password [email or web address] 09/02/2013, 3:31 PM   LOS: 3 days   **Disclaimer: This note may have been dictated with voice recognition software. Similar sounding words can inadvertently be transcribed and this note may contain transcription errors which may not have been corrected upon publication of note.**

## 2013-09-02 NOTE — Progress Notes (Signed)
I have seen and examined the pt and agree with PA-Jenning's progress note. WTD dressing changes Hope for wound vac tomorrow

## 2013-09-02 NOTE — Progress Notes (Signed)
Pt seen and examined. Agree with assessment and plan as noted above. Briefly, pt presents with thigh abscess with sepsis on broad spectrum abx. Wound cx with predominantly gram neg organisms and few gram pos. Consider stopping vanc tomorrow (day 5 of tx) and cont on broad gram neg coverage.

## 2013-09-02 NOTE — Progress Notes (Signed)
3 Days Post-Op  Subjective: Family says she feels better, but lethargic this AM.  Dressing was change by Dr. Derrell Lollingamirez and site looks fine.    Objective: Vital signs in last 24 hours: Temp:  [98.1 F (36.7 C)-99.7 F (37.6 C)] 99 F (37.2 C) (07/23 0500) Pulse Rate:  [87-102] 90 (07/23 0500) Resp:  [17-27] 22 (07/23 0500) BP: (115-148)/(57-87) 130/70 mmHg (07/23 0500) SpO2:  [93 %-98 %] 94 % (07/23 0500) Weight:  [144.8 kg (319 lb 3.6 oz)] 144.8 kg (319 lb 3.6 oz) (07/23 [id number])  Diet;  Carb modified, nothing recorded for intake TM 99.7 Sugars improving WBC up but improving Intake/Output from previous day: 07/22 0701 - 07/23 0700 In: 190 [P.O.:100; I.V.:90] Out: 1490 [Urine:1490] Intake/Output this shift:    General appearance: sleepy and lethargic never really spoke to us. Skin: Large open site, is well debridied, and clean, no further debridement required.    Lab Results:   Recent Labs  09/01/13 0925 09/02/13 0530  WBC 24.0* 18.1*  HGB 10.1* 9.3*  HCT 30.2* 28.1*  PLT 237 270    BMET  Recent Labs  09/01/13 0406 09/02/13 0530  NA 132* 136*  K 4.0 4.0  CL 97 101  CO2 20 22  GLUCOSE 266* 208*  BUN 42* 38*  CREATININE 1.38* 1.15*  CALCIUM 7.7* 8.0*   PT/INR No results found for this basename: LABPROT, INR,  in the last 72 hours   Recent Labs Lab 08/30/13 1429 08/31/13 0220  AST 36 27  ALT 20 19  ALKPHOS 140* 154*  BILITOT 0.4 0.2*  PROT 8.1 6.9  ALBUMIN 1.7* 1.5*     Lipase  No results found for this basename: lipase     Studies/Results: No results found.  Medications: . antiseptic oral rinse  15 mL Mouth Rinse QID  . arformoterol  15 mcg Nebulization Q12H  . budesonide (PULMICORT) nebulizer solution  0.25 mg Nebulization Q12H  . chlorhexidine  15 mL Mouth Rinse BID  . heparin  5,000 Units Subcutaneous 3 times per day  . hydrALAZINE  50 mg Oral 3 times per day  . insulin aspart  0-15 Units Subcutaneous TID WC  . insulin aspart  4 Units  Subcutaneous TID WC  . insulin glargine  20 Units Subcutaneous Daily  . piperacillin-tazobactam (ZOSYN)  IV  3.375 g Intravenous Q8H  . vancomycin  1,250 mg Intravenous Q12H    Assessment/Plan 1. S/p status post irrigation and debridement of left thigh abscess/necrotic tissue,  08/30/2013, Axel FillerArmando Ramirez, MD 2. DKA, resolved  3. Diabetes mellitus  4. Hypertension  5. Noncompliance 6.  Body mass index is 54.77 kg/(m^2).   Plan:  Hydrotherapy, and wet to dry dressings, diabetes control.     LOS: 3 days    Monico Sudduth 09/02/2013

## 2013-09-03 ENCOUNTER — Inpatient Hospital Stay (HOSPITAL_COMMUNITY): Payer: Medicaid Other

## 2013-09-03 LAB — BASIC METABOLIC PANEL
Anion gap: 13 (ref 5–15)
BUN: 35 mg/dL — ABNORMAL HIGH (ref 6–23)
CALCIUM: 7.9 mg/dL — AB (ref 8.4–10.5)
CO2: 21 mEq/L (ref 19–32)
Chloride: 106 mEq/L (ref 96–112)
Creatinine, Ser: 1.1 mg/dL (ref 0.50–1.10)
GFR calc Af Amer: 74 mL/min — ABNORMAL LOW (ref >90)
GFR, EST NON AFRICAN AMERICAN: 64 mL/min — AB (ref >90)
GLUCOSE: 171 mg/dL — AB (ref 70–99)
Potassium: 4.2 mEq/L (ref 3.7–5.3)
Sodium: 140 mEq/L (ref 137–147)

## 2013-09-03 LAB — IRON AND TIBC
IRON: 28 ug/dL — AB (ref 42–135)
SATURATION RATIOS: 22 % (ref 20–55)
TIBC: 128 ug/dL — ABNORMAL LOW (ref 250–470)
UIBC: 100 ug/dL — ABNORMAL LOW (ref 125–400)

## 2013-09-03 LAB — GLUCOSE, CAPILLARY
Glucose-Capillary: 213 mg/dL — ABNORMAL HIGH (ref 70–99)
Glucose-Capillary: 204 mg/dL — ABNORMAL HIGH (ref 70–99)
Glucose-Capillary: 168 mg/dL — ABNORMAL HIGH (ref 70–99)
Glucose-Capillary: 128 mg/dL — ABNORMAL HIGH (ref 70–99)

## 2013-09-03 LAB — CBC
HEMATOCRIT: 29.4 % — AB (ref 36.0–46.0)
HEMOGLOBIN: 9.7 g/dL — AB (ref 12.0–15.0)
MCH: 26.6 pg (ref 26.0–34.0)
MCHC: 33 g/dL (ref 30.0–36.0)
MCV: 80.8 fL (ref 78.0–100.0)
Platelets: 335 10*3/uL (ref 150–400)
RBC: 3.64 MIL/uL — ABNORMAL LOW (ref 3.87–5.11)
RDW: 14.5 % (ref 11.5–15.5)
WBC: 17 10*3/uL — ABNORMAL HIGH (ref 4.0–10.5)

## 2013-09-03 LAB — RETICULOCYTES
RBC.: 3.64 MIL/uL — ABNORMAL LOW (ref 3.87–5.11)
Retic Count, Absolute: 51 10*3/uL (ref 19.0–186.0)
Retic Ct Pct: 1.4 % (ref 0.4–3.1)

## 2013-09-03 LAB — VITAMIN B12: Vitamin B-12: 663 pg/mL (ref 211–911)

## 2013-09-03 LAB — PROCALCITONIN: Procalcitonin: 7.2 ng/mL

## 2013-09-03 LAB — FOLATE: FOLATE: 4.1 ng/mL

## 2013-09-03 LAB — FERRITIN: Ferritin: 1550 ng/mL — ABNORMAL HIGH (ref 10–291)

## 2013-09-03 MED ORDER — INSULIN GLARGINE 100 UNIT/ML ~~LOC~~ SOLN
22.0000 [IU] | Freq: Every day | SUBCUTANEOUS | Status: DC
  Administered 2013-09-04 – 2013-09-12 (×9): 22 [IU] via SUBCUTANEOUS
  Filled 2013-09-03 (×10): qty 0.22

## 2013-09-03 MED ORDER — INSULIN ASPART 100 UNIT/ML ~~LOC~~ SOLN
6.0000 [IU] | Freq: Three times a day (TID) | SUBCUTANEOUS | Status: DC
Start: 2013-09-03 — End: 2013-09-13
  Administered 2013-09-03 – 2013-09-12 (×13): 6 [IU] via SUBCUTANEOUS

## 2013-09-03 MED ORDER — LIVING WELL WITH DIABETES BOOK
Freq: Once | Status: AC
  Administered 2013-09-03: 1
  Filled 2013-09-03: qty 1

## 2013-09-03 MED ORDER — PRO-STAT SUGAR FREE PO LIQD
30.0000 mL | Freq: Two times a day (BID) | ORAL | Status: DC
  Administered 2013-09-05 – 2013-09-13 (×12): 30 mL via ORAL
  Filled 2013-09-03 (×21): qty 30

## 2013-09-03 MED ORDER — GEMFIBROZIL 600 MG PO TABS
600.0000 mg | ORAL_TABLET | Freq: Two times a day (BID) | ORAL | Status: DC
  Administered 2013-09-04 – 2013-09-13 (×18): 600 mg via ORAL
  Filled 2013-09-03 (×25): qty 1

## 2013-09-03 MED ORDER — INSULIN STARTER KIT- SYRINGES (ENGLISH)
1.0000 | Freq: Once | Status: DC
  Filled 2013-09-03: qty 1

## 2013-09-03 NOTE — Clinical Social Work Psychosocial (Signed)
Clinical Social Work Department BRIEF PSYCHOSOCIAL ASSESSMENT 09/03/2013  Patient:  [name], [name]     Account Number:  [id number]     Admit date:  08/30/2013  Clinical Social Worker:  Lovey Newcomer  Date/Time:  09/03/2013 10:00 AM  Referred by:  Physician  Date Referred:  09/03/2013 Referred for  SNF Placement   Other Referral:   Interview type:  Family Other interview type:   Patient's family interviewed at bedside. Patient kept falling asleep during assessment.    PSYCHOSOCIAL DATA Living Status:  HUSBAND Admitted from facility:   Level of care:   Primary support name:  [name] Primary support relationship to patient:  SPOUSE Degree of support available:   SUpport is fair    CURRENT CONCERNS Current Concerns  Post-Acute Placement   Other Concerns:    SOCIAL WORK ASSESSMENT / PLAN CSW met with patient and patient's family (spouse and sister) at bedside to complete assessment. CSW explained that the treatment team has recommended that the patient go to a SNF at discharge for wound care. Patient's family unsure if this is an option they want to consider as they would like to take patient home. Family understands that patient needs extensive wound care which can be provided at [facility]. CSW explained SNF search/placement process for Medicaid/LOG patients and explained that the patient/family will have very limited options for placement. CSW stressed that placement in [place] may not be possible. CSW explained that weekend CSW will present family with bed offers and that patient is expected to be ready for DC over the weekend. Patient and spouse have three children at home. Patient's husband is understandably concerned about the facility that patient will go to as he wants his wife to receive the best care possible.   Assessment/plan status:  Psychosocial Support/Ongoing Assessment of Needs Other assessment/ plan:   Complete FL2, Fax, PASRR   Information/referral to  community resources:   Bed offers will be given.    PATIENT'S/FAMILY'S RESPONSE TO PLAN OF CARE: Patient/family agreeable to SNF search and understand that if family/patient decide against SNF placement that patient will be discharged home. CSW will assist.       Liz Beach MSW, [place], [place], [phone]

## 2013-09-03 NOTE — Progress Notes (Signed)
Patient ID: [name], female   DOB: Oct 23, 1975, 38 y.o.   MRN: [id number]         PATIENT DETAILS Name: [name] Age: 38 y.o. Sex: female Date of Birth: 01/14/1976 Admit Date: 08/30/2013 Admitting Physician Storm Frisk, MD WGN:FAOZH,YQMVH, MD  Brief History: Patient is a morbidly obese 38 year-old female with a PMH of diabetes and asthma who was admitted for a left thigh abscess complicated by sepsis, AKI, and hyperglycemia.  Subjective: Patient is alert today. She states she is thirsty and has some mild neck and back pain. Otherwise, she has no new complaints. Pain is well controlled. Patient denies headache, dizziness, chest pain, SOB, nausea, vomiting, diarrhea, or constipation.  Objective: Gen Exam: Patient is alert and oriented today, much improved since yesterday. She is able to converse easily, however, her speech is somewhat unclear and her husband says this may be somewhat changed from baseline.  Neurologic: Alert and oriented x3. No facial droop. Right hand grip 4/5, left hand grip 5/5. Patient able to move all extremities.  Neck: Supple, No JVD.   Chest: B/L Clear, moving air bilaterally CVS: RRR, S1 S2 Regular, no murmurs.  Abdomen: soft, BS +, non tender, non distended. Extremities: no edema, lower extremities warm to touch, SCDs in place. Skin: No Rash.   Wounds: Large left thigh-inguinal surgical wound bandaged appropriately at time of assessment.   Assessment/Plan:  Right sided weakness and unclear speech Ordered MR of brain without contrast to assess for CVA  Left thigh wound s/p surgical drainage and debridement of abscess   Second set of blood cultures, NGTD  Urine culture negative. Wound culture shows no growth.  Gram stain shows abundant gm neg rods and moderate GPC in pairs. Continue zosyn; discontinue vancomycin today.  Discontinue hydrotherapy per CCS Consider discharge when medically cleared to SNF or LTAC  Sepsis.  Resolved; patient no  longer meets SIRS criteria Continue to resuscitate with IVF  Acute encephalopathy secondary to sepsis.   Resolved; alert and oriented x 3. Advance diet to carb modified.  [place] Agitation and Sedation, Currently 0: Alert and calm  Acute respiratory failure secondary to sepsis with history of asthma.  Resolved; previously required intubation during this hospitalization. Supplement 2L oxygen nasal cannula prn; SpO2 96%  Continue pulmicort, brovana with prn albuterol as needed  Acute kidney injury  Due to sepsis and acute infection. Now resolved; most recent creatinine 1.10  Hyponatremia.  Now resolved with IVF.  Leukocytosis.  Continuing to trend down, most recent level 17.0 CBC and BMP ordered for tomorrow morning (09/04/2013)   Microcytic Anemia Baseline appears to be 13 - 14. Hemoglobin and Hematocrit both trending up Anemia panel pending  Diabetes Mellitus type II with hyperglycemia.  Last A1C 15.4 CBGs persistently high; this morning's reading (7/24) at 213. Continue to follow routinely. Started ssi resistant, meal coverage and lantus qhs.  Will monitor and titrate as needed Patient to receive insulin education prior to d/c. Nutrition consult   High triglycerides Serum triglycerides 550. Will start gemfibrazil today; patient is alert enough to take POs.  Disposition: Remain inpatient; possibly d/c tomorrow to SNF or LTAC  DVT Prophylaxis: Prophylactic Heparin with SCDs  Code Status: Full code   Family Communication Husband and sister at bedside.   Procedures:  None at this time  CONSULTS:  General Surgery (CCS) to follow   MEDICATIONS: Scheduled Meds: . antiseptic oral rinse  15 mL Mouth Rinse QID  . arformoterol  15 mcg Nebulization Q12H  .  budesonide (PULMICORT) nebulizer solution  0.25 mg Nebulization Q12H  . chlorhexidine  15 mL Mouth Rinse BID  . heparin  5,000 Units Subcutaneous 3 times per day  . insulin aspart  0-20 Units Subcutaneous TID  WC  . insulin aspart  4 Units Subcutaneous TID WC  . insulin glargine  20 Units Subcutaneous Daily  . piperacillin-tazobactam (ZOSYN)  IV  3.375 g Intravenous Q8H   Continuous Infusions: . sodium chloride 10 mL/hr at 09/03/13 0853   PRN Meds:.acetaminophen, acetaminophen, albuterol, hydrALAZINE, oxyCODONE  Antibiotics: Anti-infectives   Start     Dose/Rate Route Frequency Ordered Stop   09/02/13 0800  vancomycin (VANCOCIN) 1,250 mg in sodium chloride 0.9 % 250 mL IVPB  Status:  Discontinued     1,250 mg 166.7 mL/hr over 90 Minutes Intravenous Every 12 hours 09/01/13 1252 09/03/13 1007   09/01/13 1100  vancomycin (VANCOCIN) 1,750 mg in sodium chloride 0.9 % 500 mL IVPB  Status:  Discontinued     1,750 mg 250 mL/hr over 120 Minutes Intravenous Every 24 hours 08/31/13 1418 09/01/13 1252   08/31/13 2000  piperacillin-tazobactam (ZOSYN) IVPB 3.375 g     3.375 g 12.5 mL/hr over 240 Minutes Intravenous Every 8 hours 08/31/13 1026     08/31/13 1200  vancomycin (VANCOCIN) 1,750 mg in sodium chloride 0.9 % 500 mL IVPB     1,750 mg 250 mL/hr over 120 Minutes Intravenous  Once 08/31/13 1036 08/31/13 1303   08/31/13 1100  piperacillin-tazobactam (ZOSYN) IVPB 3.375 g     3.375 g 12.5 mL/hr over 240 Minutes Intravenous  Once 08/31/13 1026 08/31/13 1504   08/30/13 1515  [MAR Hold]  vancomycin (VANCOCIN) IVPB 1000 mg/200 mL premix     (On MAR Hold since 08/30/13 1616)   1,000 mg 200 mL/hr over 60 Minutes Intravenous  Once 08/30/13 1510 08/30/13 1647   08/30/13 1515  piperacillin-tazobactam (ZOSYN) IVPB 3.375 g     3.375 g 100 mL/hr over 30 Minutes Intravenous  Once 08/30/13 1510 08/30/13 1601      PHYSICAL EXAM: Vital signs in last 24 hours: Filed Vitals:   09/02/13 2058 09/02/13 2106 09/03/13 0508 09/03/13 0934  BP: 135/85  145/81   Pulse: 81 80 87   Temp: 98.3 F (36.8 C)  98.3 F (36.8 C)   TempSrc: Oral  Oral   Resp: 20 18 18    Weight:      SpO2: 96% 94% 97% 96%    Weight  change:  Filed Weights   08/30/13 2016 09/01/13 2134 09/02/13 0635  Weight: 135.5 kg (298 lb 11.6 oz) 144.8 kg (319 lb 3.6 oz) 144.8 kg (319 lb 3.6 oz)   Body mass index is 54.77 kg/(m^2).     Intake/Output from previous day:  Intake/Output Summary (Last 24 hours) at 09/03/13 1021 Last data filed at 09/03/13 0125  Gross per 24 hour  Intake  652.5 ml  Output    300 ml  Net  352.5 ml     LAB RESULTS: CBC  Recent Labs Lab 08/30/13 1429 08/31/13 0220 09/01/13 0925 09/02/13 0530 09/03/13 0500  WBC 24.0* 21.3* 24.0* 18.1* 17.0*  HGB 11.6* 9.9* 10.1* 9.3* 9.7*  HCT 33.6* 29.3* 30.2* 28.1* 29.4*  PLT 173 185 237 270 335  MCV 78.3 78.8 80.7 78.7 80.8  MCH 27.0 26.6 27.0 26.1 26.6  MCHC 34.5 33.8 33.4 33.1 33.0  RDW 13.3 13.7 14.1 14.1 14.5  LYMPHSABS 2.2  --   --   --   --  MONOABS 1.4*  --   --   --   --   EOSABS 0.0  --   --   --   --   BASOSABS 0.0  --   --   --   --     Chemistries   Recent Labs Lab 08/30/13 1429  08/31/13 0220  08/31/13 1825 08/31/13 2208 09/01/13 0406 09/02/13 0530 09/03/13 0500  NA 125*  --  131*  < > 130* 131* 132* 136* 140  K 4.6  --  3.8  < > 4.4 4.0 4.0 4.0 4.2  CL 85*  --  94*  < > 93* 100 97 101 106  CO2 17*  --  21  < > 21 20 20 22 21   GLUCOSE 430*  < > 218*  < > 328* 297* 266* 208* 171*  BUN 48*  --  50*  < > 43* 43* 42* 38* 35*  CREATININE 1.85*  --  1.88*  < > 1.35* 1.41* 1.38* 1.15* 1.10  CALCIUM 8.5  --  7.5*  < > 7.6* 7.4* 7.7* 8.0* 7.9*  MG  --   --  2.6*  --   --   --   --   --   --   < > = values in this interval not displayed.  CBG:  Recent Labs Lab 09/02/13 0808 09/02/13 1200 09/02/13 1724 09/02/13 2056 09/03/13 0751  GLUCAP 197* 195* 171* 172* 213*     Recent Labs  08/31/13 1201  HGBA1C 15.4*   No results found for this basename: CHOL, HDL, LDLCALC, TRIG, CHOLHDL, LDLDIRECT,  in the last 72 hours  MICROBIOLOGY: Recent Results (from the past 240 hour(s))  CULTURE, BLOOD (ROUTINE X 2)     Status:  None   Collection Time    08/30/13  2:29 PM      Result Value Ref Range Status   Specimen Description BLOOD LEFT ANTECUBITAL   Final   Special Requests     Final   Value: BOTTLES DRAWN AEROBIC AND ANAEROBIC 6CC AER 5CC ANA   Culture  Setup Time     Final   Value: 08/30/2013 19:36     Performed at [facility]   Culture     Final   Value:        BLOOD CULTURE RECEIVED NO GROWTH TO DATE CULTURE WILL BE HELD FOR 5 DAYS BEFORE ISSUING A FINAL NEGATIVE REPORT     Performed at [facility]   Report Status PENDING   Incomplete  CULTURE, BLOOD (ROUTINE X 2)     Status: None   Collection Time    08/30/13  2:41 PM      Result Value Ref Range Status   Specimen Description BLOOD LEFT HAND   Final   Special Requests BOTTLES DRAWN AEROBIC AND ANAEROBIC 6CC   Final   Culture  Setup Time     Final   Value: 08/30/2013 19:36     Performed at [facility]   Culture     Final   Value: STAPHYLOCOCCUS SPECIES (COAGULASE NEGATIVE)     Note: THE SIGNIFICANCE OF ISOLATING THIS ORGANISM FROM A SINGLE SET OF BLOOD CULTURES WHEN MULTIPLE SETS ARE DRAWN IS UNCERTAIN. PLEASE NOTIFY THE MICROBIOLOGY DEPARTMENT WITHIN ONE WEEK IF SPECIATION AND SENSITIVITIES ARE REQUIRED.     Note: Gram Stain Report Called to,Read Back By and Verified With: SARA G@12 :54PM ON 08/31/13 BY DANTS     Performed at [facility]  Report Status 09/01/2013 FINAL   Final  WOUND CULTURE     Status: None   Collection Time    08/30/13  5:06 PM      Result Value Ref Range Status   Specimen Description WOUND THIGH LEFT   Final   Special Requests PATIENT ON FOLLOWING VANCOMYCIN, ZOSYN   Final   Gram Stain     Final   Value: RARE WBC PRESENT, PREDOMINANTLY MONONUCLEAR     NO SQUAMOUS EPITHELIAL CELLS SEEN     ABUNDANT GRAM NEGATIVE RODS     FEW GRAM POSITIVE COCCI IN PAIRS     IN CLUSTERS FEW GRAM POSITIVE RODS   Culture     Final   Value: NO GROWTH 2 DAYS     Performed at [facility]   Report  Status 09/02/2013 FINAL   Final  ANAEROBIC CULTURE     Status: None   Collection Time    08/30/13  5:06 PM      Result Value Ref Range Status   Specimen Description WOUND THIGH LEFT   Final   Special Requests PATIENT ON FOLLOWING VANCOMYCIN, ZOSYN   Final   Gram Stain     Final   Value: RARE WBC PRESENT, PREDOMINANTLY MONONUCLEAR     NO SQUAMOUS EPITHELIAL CELLS SEEN     ABUNDANT GRAM NEGATIVE RODS     FEW GRAM POSITIVE COCCI IN PAIRS     IN CLUSTERS FEW GRAM POSITIVE RODS   Culture     Final   Value: NO ANAEROBES ISOLATED; CULTURE IN PROGRESS FOR 5 DAYS     Performed at [facility]   Report Status PENDING   Incomplete  MRSA PCR SCREENING     Status: None   Collection Time    08/30/13  7:11 PM      Result Value Ref Range Status   MRSA by PCR NEGATIVE  NEGATIVE Final   Comment:            The GeneXpert MRSA Assay (FDA     approved for NASAL specimens     only), is one component of a     comprehensive MRSA colonization     surveillance program. It is not     intended to diagnose MRSA     infection nor to guide or     monitor treatment for     MRSA infections.  URINE CULTURE     Status: None   Collection Time    08/30/13  7:12 PM      Result Value Ref Range Status   Specimen Description URINE, CATHETERIZED   Final   Special Requests NONE   Final   Culture  Setup Time     Final   Value: 08/31/2013 00:49     Performed at [facility] Count     Final   Value: NO GROWTH     Performed at [facility]   Culture     Final   Value: NO GROWTH     Performed at [facility]   Report Status 09/01/2013 FINAL   Final  CULTURE, BLOOD (ROUTINE X 2)     Status: None   Collection Time    09/01/13 11:10 AM      Result Value Ref Range Status   Specimen Description BLOOD LEFT ANTECUBITAL   Final   Special Requests BOTTLES DRAWN AEROBIC ONLY 9CC   Final   Culture  Setup Time  Final   Value: 09/01/2013 14:01     Performed at [facility]   Culture     Final   Value:        BLOOD CULTURE RECEIVED NO GROWTH TO DATE CULTURE WILL BE HELD FOR 5 DAYS BEFORE ISSUING A FINAL NEGATIVE REPORT     Performed at [facility]   Report Status PENDING   Incomplete  CULTURE, BLOOD (ROUTINE X 2)     Status: None   Collection Time    09/01/13 11:20 AM      Result Value Ref Range Status   Specimen Description BLOOD LEFT HAND   Final   Special Requests BOTTLES DRAWN AEROBIC ONLY [facility]   Final   Culture  Setup Time     Final   Value: 09/01/2013 14:01     Performed at [facility]   Culture     Final   Value:        BLOOD CULTURE RECEIVED NO GROWTH TO DATE CULTURE WILL BE HELD FOR 5 DAYS BEFORE ISSUING A FINAL NEGATIVE REPORT     Performed at [facility]   Report Status PENDING   Incomplete     Calton Dach [facility] Pager:[phone]  If 7PM-7AM, please contact night-coverage [email or web address] Password [email or web address] 09/03/2013, 10:21 AM   LOS: 4 days   **Disclaimer: This note may have been dictated with voice recognition software. Similar sounding words can inadvertently be transcribed and this note may contain transcription errors which may not have been corrected upon publication of note.**

## 2013-09-03 NOTE — Progress Notes (Signed)
INITIAL NUTRITION ASSESSMENT  DOCUMENTATION CODES Per approved criteria  -Morbid Obesity   INTERVENTION: Prostat liquid protein po 30 ml BID with meals, each supplement provides 100 kcal, 15 grams protein RD to follow for nutrition care plan  NUTRITION DIAGNOSIS: Increased nutrient needs related to wound healing as evidenced by estimated nutrition needs  Goal: Pt to meet >/= 90% of their estimated nutrition needs   Monitor:  PO & supplemental intake, weight, labs, I/O's  Reason for Assessment: Consult  38 y.o. female  Admitting Dx: left thigh abscess  ASSESSMENT: Patient is a morbidly obese 38 year-old female with a PMH of diabetes and asthma who was admitted for a left thigh abscess complicated by sepsis, AKI, and hyperglycemia.  Patient sleepy upon RD visit; spoke with family bedside; state pt had breakfast this AM; would benefit from addition protein to promote wound healing; RD to order.   CWOCN note reviewed 7/24 -- pt with full thickness wound to left thigh.  No muscle or subcutaneous fat depletion noticed.  Height: Ht Readings from Last 1 Encounters:  09/03/13 5\' 1"  (1.549 m)    Weight: Wt Readings from Last 1 Encounters:  09/02/13 319 lb 3.6 oz (144.8 kg)    Ideal Body Weight: 105 lb  % Ideal Body Weight: 303%  Wt Readings from Last 10 Encounters:  09/02/13 319 lb 3.6 oz (144.8 kg)  09/02/13 319 lb 3.6 oz (144.8 kg)  02/21/13 296 lb 3.2 oz (134.355 kg)  02/20/13 297 lb (134.718 kg)  10/11/12 177 lb 12.8 oz (80.65 kg)  10/11/12 177 lb 12.8 oz (80.65 kg)  10/06/12 178 lb 8 oz (80.967 kg)  09/29/12 176 lb 12.8 oz (80.196 kg)  09/22/12 175 lb 1.6 oz (79.425 kg)  09/15/12 174 lb 4.8 oz (79.062 kg)    Usual Body Weight: 296 lb  % Usual Body Weight: 107%  BMI:  Body mass index is 60.35 kg/(m^2).  Estimated Nutritional Needs: Kcal: 1800-2000 Protein: 100-110 gm Fluid: 1.8-2.0 L  Skin: left thigh full thickness wound  Diet Order: Carb  Control  EDUCATION NEEDS: -Education not appropriate at this time   Intake/Output Summary (Last 24 hours) at 09/03/13 1328 Last data filed at 09/03/13 0125  Gross per 24 hour  Intake  652.5 ml  Output    300 ml  Net  352.5 ml    Labs:   Recent Labs Lab 08/30/13 1429  08/31/13 0220  09/01/13 0406 09/02/13 0530 09/03/13 0500  NA 125*  --  131*  < > 132* 136* 140  K 4.6  --  3.8  < > 4.0 4.0 4.2  CL 85*  --  94*  < > 97 101 106  CO2 17*  --  21  < > 20 22 21   BUN 48*  --  50*  < > 42* 38* 35*  CREATININE 1.85*  --  1.88*  < > 1.38* 1.15* 1.10  CALCIUM 8.5  --  7.5*  < > 7.7* 8.0* 7.9*  MG  --   --  2.6*  --   --   --   --   PHOS  --   --  2.8  --   --   --   --   GLUCOSE 430*  < > 218*  < > 266* 208* 171*  < > = values in this interval not displayed.  CBG (last 3)   Recent Labs  09/02/13 2056 09/03/13 0751 09/03/13 1210  GLUCAP 172* 213* 204*  Scheduled Meds: . antiseptic oral rinse  15 mL Mouth Rinse QID  . arformoterol  15 mcg Nebulization Q12H  . budesonide (PULMICORT) nebulizer solution  0.25 mg Nebulization Q12H  . chlorhexidine  15 mL Mouth Rinse BID  . gemfibrozil  600 mg Oral BID AC  . heparin  5,000 Units Subcutaneous 3 times per day  . insulin aspart  0-20 Units Subcutaneous TID WC  . insulin aspart  6 Units Subcutaneous TID WC  . [START ON 09/04/2013] insulin glargine  22 Units Subcutaneous Daily  . piperacillin-tazobactam (ZOSYN)  IV  3.375 g Intravenous Q8H    Continuous Infusions: . sodium chloride 10 mL/hr at 09/03/13 [id number]    Past Medical History  Diagnosis Date  . Morbid obesity   . Hypertension   . CVA (cerebral infarction) 1990's    "writing is not the same since"  . Asthma   . Type II diabetes mellitus   . GERD (gastroesophageal reflux disease)     Past Surgical History  Procedure Laterality Date  . Incision and drainage of wound Left 08/30/2013    thigh necrotic wound/notes 08/30/2013  . Irrigation and debridement abscess  Left 08/30/2013    Procedure: IRRIGATION AND DEBRIDEMENT ABSCESS Left Thigh Necrotic Wound;  Surgeon: Axel FillerArmando Ramirez, MD;  Location: [facility] OR;  Service: General;  Laterality: Left;    Maureen ChattersKatie Blimie Vaness, RD, LDN Pager #: [phone] After-Hours Pager #: [phone]

## 2013-09-03 NOTE — Progress Notes (Signed)
Inpatient Diabetes Program Recommendations  AACE/ADA: New Consensus Statement on Inpatient Glycemic Control (2013)  Target Ranges:  Prepandial:   less than 140 mg/dL      Peak postprandial:   less than 180 mg/dL (1-2 hours)      Critically ill patients:  140 - 180 mg/dL   Order for bedside RN to begin insulin education allowing pt to draw up and administer insulin at regularly scheduled doses.  Insulin starter-kit ordered for patient to take home. Thank you  Raoul Pitch BSN, RN,CDE Inpatient Diabetes Coordinator [phone] (team pager)

## 2013-09-03 NOTE — Clinical Social Work Placement (Addendum)
Clinical Social Work Department CLINICAL SOCIAL WORK PLACEMENT NOTE 09/03/2013  Patient:  [name],[name]  Account Number:  [id number] Admit date:  08/30/2013  Clinical Social Worker:  Cherre BlancJOSEPH BRYANT CAMPBELL, [place]  Date/time:  09/03/2013 11:00 AM  Clinical Social Work is seeking post-discharge placement for this patient at the following level of care:   SKILLED NURSING   (*CSW will update this form in Epic as items are completed)   09/03/2013  [facility]family provided with [facility] Work's list of facilities offering this level of care within the geographic area requested by the patient (or if unable, by the patient's family).  09/03/2013  Patient/family informed of their freedom to choose among providers that offer the needed level of care, that participate in Medicare, Medicaid or managed care program needed by the patient, have an available bed and are willing to accept the patient.  09/03/2013  Patient/family informed of [facility]' ownership interest in [facility], as well as of the fact that they are under no obligation to receive care at this facility.  PASARR submitted to EDS on 09/03/2013 PASARR number received on 09/03/2013  FL2 transmitted to all facilities in geographic area requested by pt/family on  09/03/2013 FL2 transmitted to all facilities within larger geographic area on   Patient informed that his/her managed care company has contracts with or will negotiate with  certain facilities, including the following:     Patient/family informed of bed offers received:  09/10/2013 [facility] Physician recommends and patient chooses bed at    [facility]  09/13/2013 Patient to be transferred to facility by [facility] Patient and family notified of transfer on 09/13/2013 Name of family member notified:  Pt and pt's husband Linda Cheeks([name]) notified at  bedside  The following physician request were entered in Epic:   Additional Comments:   Roddie McBryant Campbell MSW, [place], [place], [phone]

## 2013-09-03 NOTE — Progress Notes (Signed)
Patient ID: [name], female   DOB: 12/25/1975, 38 y.o.   MRN: [id number] 4 Days Post-Op  Subjective: Patient arousable but still sleepy. No other complaints.  Objective: Vital signs in last 24 hours: Temp:  [98.3 F (36.8 C)-98.8 F (37.1 C)] 98.3 F (36.8 C) (07/24 0508) Pulse Rate:  [80-87] 87 (07/24 0508) Resp:  [18-20] 18 (07/24 0508) BP: (135-160)/(81-90) 145/81 mmHg (07/24 0508) SpO2:  [94 %-97 %] 96 % (07/24 0934) Last BM Date: 09/03/13  Intake/Output from previous day: 07/23 0701 - 07/24 0700 In: 652.5 [I.V.:202.5; IV Piggyback:450] Out: 300 [Urine:300] Intake/Output this shift:    PE: Skin: Dressing intact. Hydrotherapy had just finished. The wound was not undressed. Hydrotherapy states wound is 100% clean.  Lab Results:   Recent Labs  09/02/13 0530 09/03/13 0500  WBC 18.1* 17.0*  HGB 9.3* 9.7*  HCT 28.1* 29.4*  PLT 270 335   BMET  Recent Labs  09/02/13 0530 09/03/13 0500  NA 136* 140  K 4.0 4.2  CL 101 106  CO2 22 21  GLUCOSE 208* 171*  BUN 38* 35*  CREATININE 1.15* 1.10  CALCIUM 8.0* 7.9*   PT/INR No results found for this basename: LABPROT, INR,  in the last 72 hours CMP     Component Value Date/Time   NA 140 09/03/2013 0500   K 4.2 09/03/2013 0500   CL 106 09/03/2013 0500   CO2 21 09/03/2013 0500   GLUCOSE 171* 09/03/2013 0500   BUN 35* 09/03/2013 0500   CREATININE 1.10 09/03/2013 0500   CALCIUM 7.9* 09/03/2013 0500   PROT 6.9 08/31/2013 0220   ALBUMIN 1.5* 08/31/2013 0220   AST 27 08/31/2013 0220   ALT 19 08/31/2013 0220   ALKPHOS 154* 08/31/2013 0220   BILITOT 0.2* 08/31/2013 0220   GFRNONAA 64* 09/03/2013 0500   GFRAA 74* 09/03/2013 0500   Lipase  No results found for this basename: lipase       Studies/Results: No results found.  Anti-infectives: Anti-infectives   Start     Dose/Rate Route Frequency Ordered Stop   09/02/13 0800  vancomycin (VANCOCIN) 1,250 mg in sodium chloride 0.9 % 250 mL IVPB  Status:  Discontinued     1,250 mg 166.7 mL/hr over 90 Minutes Intravenous Every 12 hours 09/01/13 1252 09/03/13 1007   09/01/13 1100  vancomycin (VANCOCIN) 1,750 mg in sodium chloride 0.9 % 500 mL IVPB  Status:  Discontinued     1,750 mg 250 mL/hr over 120 Minutes Intravenous Every 24 hours 08/31/13 1418 09/01/13 1252   08/31/13 2000  piperacillin-tazobactam (ZOSYN) IVPB 3.375 g     3.375 g 12.5 mL/hr over 240 Minutes Intravenous Every 8 hours 08/31/13 1026     08/31/13 1200  vancomycin (VANCOCIN) 1,750 mg in sodium chloride 0.9 % 500 mL IVPB     1,750 mg 250 mL/hr over 120 Minutes Intravenous  Once 08/31/13 1036 08/31/13 1303   08/31/13 1100  piperacillin-tazobactam (ZOSYN) IVPB 3.375 g     3.375 g 12.5 mL/hr over 240 Minutes Intravenous  Once 08/31/13 1026 08/31/13 1504   08/30/13 1515  [MAR Hold]  vancomycin (VANCOCIN) IVPB 1000 mg/200 mL premix     (On MAR Hold since 08/30/13 1616)   1,000 mg 200 mL/hr over 60 Minutes Intravenous  Once 08/30/13 1510 08/30/13 1647   08/30/13 1515  piperacillin-tazobactam (ZOSYN) IVPB 3.375 g     3.375 g 100 mL/hr over 30 Minutes Intravenous  Once 08/30/13 1510 08/30/13 1601  Assessment/Plan   1. S/p status post irrigation and debridement of left thigh abscess/necrotic tissue, 08/30/2013, [name], MD  2. DKA, resolved  3. Diabetes mellitus  4. Hypertension  5. Noncompliance  6. Body mass index is 54.77 kg/(m^2).  Plan: 1. Patient's wound is clean. We will discontinue hydrotherapy. She will continue with normal saline wet to dry dressing changes twice a day. She is stable from our standpoint for discharge to SNF or LTAC. 2. Patient's mental status is a little better today but still seems quite sedated. Will defer further workup to the primary team.  LOS: 4 days    [name] E 09/03/2013, 10:34 AM Pager: [phone]

## 2013-09-03 NOTE — Progress Notes (Signed)
Pt seen and examined. Agree with assessment and plan as outlined above. Pt with thigh abscess with sepsis. Mental status has improved, although pt remains slow to respond. Agree with obtaining imaging of head/brain to r/o intracranial process. Of note, pt does appear to be slightly more weak on RUE vs L.

## 2013-09-03 NOTE — Progress Notes (Signed)
I have seen and examined the pt and agree with PA-Osborne's progress note. Pt tol WTD dressing changes Abx

## 2013-09-03 NOTE — Consult Note (Addendum)
WOC wound consult note Reason for Consult: Consult requested to assess left groin and left thigh wound for possible Vac application.  Pt is followed by [facility] team and physical therapy for hydrotherapy to assist with removal of nonviable tissue.  Wound type: Full thickness Measurement: Refer to PT notes for measurements, percentages, and wound assessment. Wound bed: Wound with decreased amt nonviable tissue and would be appropriate for Vac therapy if seal could be maintained.  Discussed with physical therapy at bedside prior to hydro tx and DO NOT feel that this topical treatment is a viable solution.  There is a very small bridge of intact tissue between thigh and perineum which would not allow adequate seal.  Also, pt is constantly moist in groin and abd skin folds which can create a problem with maintaining vac drape.  Pt also incontinent of loose stool which has currently leaked under gauze dressing.  Stool could become trapped under the vac sponge for prolonged periods of time if applied.  Recommend continuing moist gauze dressings Q day and changing PRN when soiled for optimal plan of care to promote healing. Physical therapy feels that hydrotherapy has removed as much nonviable tissue as possible at this point; please consider discontinuing if desired.  Please re-consult if further assistance is needed.  Thank-you,  Cammie Mcgeeawn Niemah Schwebke MSN, RN, CWOCN, [facility]usWCN-AP, CNS [phone]

## 2013-09-03 NOTE — Progress Notes (Signed)
Physical Therapy Wound Treatment and Discharge Patient Details  Name: [name] MRN: [id number] Date of Birth: [birth year]  Today's Date: 09/03/2013 Time: 2446-2863 Time Calculation (min): 33 min  Subjective  Subjective: pt mostly sleeping; screamed out in pain 3 times, then falls back asleep Patient and Family Stated Goals: not mention---too lethargic Date of Onset:  (Developed a boil about a week prior to admission) Prior Treatments: None  Pain Score:   pt unable to rate; sleeping and then cries out and immediately falls asleep again  Wound Assessment  Clinical Statement: Wound is nearly 100% healthy tissue (granulation, adipose, fascia) with no further indications for hydrotherapy. Recommend continued dressing changes by nursing (may need to incr frequency to tid due to copious drainage)  Wound / Incision (Open or Dehisced) 09/01/13 Incision - Open Groin Left;Anterior Large Left anterior thigh and groin post surgical debridement for abscess (Active)  Dressing Type Gauze (Comment);Barrier Film (skin prep);ABD;Moist to moist;Tape dressing 09/03/2013  9:10 AM  Dressing Changed Changed 09/03/2013  9:10 AM  Dressing Status Clean;Intact;Dry 09/03/2013  9:10 AM  Dressing Change Frequency Twice a day 09/03/2013  9:10 AM  Site / Wound Assessment Yellow;Pink;Brown 09/03/2013  9:10 AM  % Wound base Red or Granulating 60% 09/03/2013  9:10 AM  % Wound base Yellow 5% 09/03/2013  9:10 AM  % Wound base Black 0% 09/03/2013  9:10 AM  % Wound base Other (Comment) 30% 09/03/2013  9:10 AM  Peri-wound Assessment Intact 09/03/2013  9:10 AM  Wound Length (cm) 10 cm 09/01/2013  2:18 PM  Wound Width (cm) 27 cm 09/01/2013  2:18 PM  Wound Depth (cm) 4.5 cm 09/01/2013  2:18 PM  Margins Unattached edges (unapproximated) 09/03/2013  9:10 AM  Closure None 09/03/2013  9:10 AM  Drainage Amount Copious 09/03/2013  9:10 AM  Drainage Description Serosanguineous 09/03/2013  9:10 AM  Non-staged Wound Description Full thickness  09/03/2013  9:10 AM  Treatment Hydrotherapy (Pulse lavage);Debridement (Selective);Packing (Saline gauze) 09/03/2013  9:10 AM      Hydrotherapy Pulsed lavage therapy - wound location: L ant-medial thigh  Pulsed Lavage with Suction (psi): 4 psi Pulsed Lavage with Suction - Normal Saline Used: 1000 mL Pulsed Lavage Tip: Tip with splash shield Selective Debridement Selective Debridement - Location: anterior-medial thigh on Left  Selective Debridement - Tools Used: Other (comment) (pulse lavage tip; qtip) Selective Debridement - Tissue Removed: slough   Wound Assessment and Plan  Wound Therapy - Assess/Plan/Recommendations Wound Therapy - Clinical Statement: Wound is nearly 100% healthy tissue (granulation, adipose, fascia) with no further indications for hydrotherapy. Recommend continued dressing changes by nursing (may need to incr frequency to tid due to copious drainage) Wound Therapy - Functional Problem List: Wound limiting movement at this point Factors Delaying/Impairing Wound Healing: Diabetes Mellitus;Infection - systemic/local Hydrotherapy Plan: Other (comment) (d/c hydrotherapy) Wound Therapy - Current Recommendations: PT Wound Therapy - Follow Up Recommendations: Home health RN;Other (comment)  Wound Therapy Goals- Improve the function of patient's integumentary system by progressing the wound(s) through the phases of wound healing (inflammation - proliferation - remodeling) by: Decrease Necrotic Tissue to: 10 Decrease Necrotic Tissue - Progress: Met Increase Granulation Tissue to: 90 Increase Granulation Tissue - Progress: Met Improve Drainage Characteristics: Min;Serous Improve Drainage Characteristics - Progress: Partly met  Goals will be updated until maximal potential achieved or discharge criteria met.  Discharge criteria: when goals achieved, discharge from hospital, MD decision/surgical intervention, no progress towards goals, refusal/missing three consecutive  treatments without notification or medical reason.  GP  Kyndra Condron 09/03/2013, 9:16 AM Pager [phone]

## 2013-09-04 DIAGNOSIS — I6350 Cerebral infarction due to unspecified occlusion or stenosis of unspecified cerebral artery: Secondary | ICD-10-CM

## 2013-09-04 LAB — CBC
HEMATOCRIT: 35.9 % — AB (ref 36.0–46.0)
Hemoglobin: 11.6 g/dL — ABNORMAL LOW (ref 12.0–15.0)
MCH: 27 pg (ref 26.0–34.0)
MCHC: 32.3 g/dL (ref 30.0–36.0)
MCV: 83.5 fL (ref 78.0–100.0)
PLATELETS: 350 10*3/uL (ref 150–400)
RBC: 4.3 MIL/uL (ref 3.87–5.11)
RDW: 14.8 % (ref 11.5–15.5)
WBC: 11.6 10*3/uL — ABNORMAL HIGH (ref 4.0–10.5)

## 2013-09-04 LAB — GLUCOSE, CAPILLARY
GLUCOSE-CAPILLARY: 202 mg/dL — AB (ref 70–99)
Glucose-Capillary: 240 mg/dL — ABNORMAL HIGH (ref 70–99)
Glucose-Capillary: 233 mg/dL — ABNORMAL HIGH (ref 70–99)
Glucose-Capillary: 126 mg/dL — ABNORMAL HIGH (ref 70–99)

## 2013-09-04 LAB — BASIC METABOLIC PANEL
ANION GAP: 19 — AB (ref 5–15)
BUN: 29 mg/dL — ABNORMAL HIGH (ref 6–23)
CALCIUM: 8.2 mg/dL — AB (ref 8.4–10.5)
CHLORIDE: 103 meq/L (ref 96–112)
CO2: 15 meq/L — AB (ref 19–32)
Creatinine, Ser: 1.01 mg/dL (ref 0.50–1.10)
GFR calc Af Amer: 82 mL/min — ABNORMAL LOW (ref >90)
GFR calc non Af Amer: 71 mL/min — ABNORMAL LOW (ref >90)
Glucose, Bld: 164 mg/dL — ABNORMAL HIGH (ref 70–99)
Potassium: 4.7 mEq/L (ref 3.7–5.3)
SODIUM: 137 meq/L (ref 137–147)

## 2013-09-04 MED ORDER — ASPIRIN 300 MG RE SUPP
300.0000 mg | Freq: Every day | RECTAL | Status: DC
  Administered 2013-09-05: 300 mg via RECTAL
  Filled 2013-09-04 (×2): qty 1

## 2013-09-04 MED ORDER — STROKE: EARLY STAGES OF RECOVERY BOOK
Freq: Once | Status: DC
  Filled 2013-09-04: qty 1

## 2013-09-04 MED ORDER — OXYCODONE HCL 5 MG PO TABS
5.0000 mg | ORAL_TABLET | Freq: Once | ORAL | Status: AC
  Administered 2013-09-04: 5 mg via ORAL
  Filled 2013-09-04: qty 1

## 2013-09-04 NOTE — Progress Notes (Signed)
General surgery:  Patient is stable. Somnolent due to recent patient pain medications.  I've seen and examined the wound today. It is quite large but is clean.  Assessment/plan 1. S/p status post irrigation and debridement of large left thigh abscess/necrotic tissue, 08/30/2013, Axel FillerArmando Ramirez, MD  Continue normal saline wet-to-dry dressing changes twice a day. Stable from our standpoint and may be discharged to SNF or LTAC Please notify us when she is discharged  2. DKA, resolved  3. Diabetes mellitus  4. Hypertension  5. Noncompliance  6. Body mass index is 54.77 kg/(m^2).   Angelia MouldHaywood M. Derrell LollingIngram, M.D., [facility] ParkFACS [facility] General and Minimally invasive Surgery Breast and Colorectal Surgery Office:   [phone]

## 2013-09-04 NOTE — Consult Note (Signed)
Referring Physician: Joseph ArtWOODS, C    Chief Complaint: Multiple embolic cerebral infarctions.  HPI: [name] is an 38 y.o. female Mr. diabetes mellitus, hypertension, previous stroke and morbid obesity who was admitted on 08/30/2013 for management of left lower extremity abscess complicated by sepsis and hyperglycemia. Abscess was incised and drained on the day of admission. Patient remained intubated postop and was placed on mechanical ventilation. She was subsequently extubated on 08/31/2013. She continued to demonstrate mental status changes and an MRI was obtained on 09/03/2013 which showed multiple small infarcts involving right and left hemispheres, corpus callosum and medulla with a pattern consistent with embolic phenomena. Patient has not demonstrated a change in speech nor focal motor abnormalities. NIH stroke score was 2 for mental status changes.  LSN: Unclear tPA Given: No: No clear when last known well; no objective focal deficits. MRankin: 3  Past Medical History  Diagnosis Date  . Morbid obesity   . Hypertension   . CVA (cerebral infarction) 1990's    "writing is not the same since"  . Asthma   . Type II diabetes mellitus   . GERD (gastroesophageal reflux disease)     Family History  Problem Relation Age of Onset  . Diabetes Mother   . Kidney disease Mother      Medications: I have reviewed the patient's current medications.  Physical Examination: Blood pressure 165/85, pulse 94, temperature 98.3 F (36.8 C), temperature source Oral, resp. rate 18, height 5\' 1"  (1.549 m), weight 144.8 kg (319 lb 3.6 oz), SpO2 96.00%, not currently breastfeeding.  Neurologic Examination: Mental Status: Somnolent but could be aroused, oriented to person and place but not to time, moderately agitated when aroused.  Speech fluent without evidence of aphasia. Able to follow commands. Cranial Nerves: II-Visual fields were normal. III/IV/VI-Pupils were equal and reacted. Extraocular  movements were full with mild alternating exotropia.    V/VII-no facial numbness and no facial weakness. VIII-normal. X-normal speech and symmetrical palatal movement. Motor: 5/5 bilaterally with normal tone and bulk Sensory: Normal throughout. Deep Tendon Reflexes: Absent at knees and ankles. Plantars: Flexor bilaterally Cerebellar: Normal finger-to-nose testing.  Mr Brain Wo Contrast  09/03/2013   CLINICAL DATA:  Change in level of consciousness. Rule out stroke. Thigh abscess with sepsis.  EXAM: MRI HEAD WITHOUT CONTRAST  TECHNIQUE: Multiplanar, multiecho pulse sequences of the brain and surrounding structures were obtained without intravenous contrast.  COMPARISON:  None.  FINDINGS: Images are mildly to moderately degraded by motion. There are numerous punctate foci of restricted diffusion involving cortex and white matter of both cerebral hemispheres consistent with acute infarcts. Several slightly larger foci of acute infarction are present in the corpus callosum measuring up to approximately 1.3 cm in size. Small, acute infarcts are also present in the right greater than left basal ganglia, and there is a punctate acute infarct anteriorly in the right aspect of the medulla. There may be a small amount of hemorrhage associated with a few of these punctate infarcts. Old infarcts are present in the bilateral basal ganglia and bilateral cerebellum.  There is no mass, midline shift, or extra-axial fluid collection. There is mild cerebral and cerebellar atrophy. Orbits are unremarkable. Minimal ethmoid air cell mucosal thickening is noted. Mastoid air cells are clear.  IMPRESSION: 1. Multiple small infarcts involving both cerebral hemispheres, corpus callosum, and medulla, consistent with embolic infarcts. These could be bland or septic emboli given history. 2. 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   Electronically Signed   By: Freida BusmanAllen  Mosetta Putt   On: 09/03/2013 17:38    Assessment: 38 y.o. 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 Stroke so likely due to proximal embolic source. Septic emboli cannot be ruled out.  Stroke Risk Factors - diabetes mellitus and hypertension  Plan:  1. Fasting lipid panel 2. MRA  of the brain without contrast 3. PT consult, OT consult, Speech consult 4. Echocardiogram, may need TEE 5. Prophylactic therapy-Antiplatelet med: Aspirin 325 mg per day orally or 300 mg per rectum daily 6. Antibiotic management per medical and surgical services 7. Telemetry monitoring   C.R. Roseanne Reno, MD [facility] [phone]  09/04/2013, 12:59 PM

## 2013-09-04 NOTE — Evaluation (Signed)
Physical Therapy Evaluation Patient Details Name: Linda HarmsDavenia [name] MRN: [id number] DOB: 06/05/1975 Today's Date: 09/04/2013   History of Present Illness  38 yo female developed boil in Lt thigh grossly 7/13.  Presented with fever (Tm 102F), lethargy.  Found to have Lt leg abscess complicated by sepsis, hyperglycemia.  Was taken to OR for I&D and remained on vent post op.  She has hx of DM, HTN, asthma.[name]  Clinical Impression  Pt lethargic on arrival with increased effort to fully arouse pt during mobility. Pt with limited effort with extremity assessment and movement and demonstrates difficulty following commands and progressing mobility. Pt will benefit from acute therapy to maximize strength, cognition, mobility and safety to decrease burden of care and increase independence. Family encouraged to have pt perform all extremity movement during the day as well as to encourage normal sleep/wake cycle so pt will be better able to participate with therapy. Will follow.     Follow Up Recommendations SNF;Supervision/Assistance - 24 hour    Equipment Recommendations  Rolling walker with 5" wheels (wide RW)    Recommendations for Other Services       Precautions / Restrictions Precautions Precautions: Fall      Mobility  Bed Mobility Overal bed mobility: Needs Assistance Bed Mobility: Rolling;Sidelying to Sit Rolling: Min assist Sidelying to sit: Mod assist       General bed mobility comments: cues for sequence with use of rail and assist to elevate trunk from surface as well as to scoot hips to EOB  Transfers Overall transfer level: Needs assistance   Transfers: Sit to/from Stand;Stand Pivot Transfers Sit to Stand: +2 safety/equipment;Min assist Stand pivot transfers: Min assist       General transfer comment: cues for hand placement and sequence with increased time, Pt stood and side stepped toward foot of bed then sat again. Stood additional time to pivot to chair. RW present but  pt pushing RW far away and not following commands for use or safety. Spouse present and assisting with transfer  Ambulation/Gait                Stairs            Wheelchair Mobility    Modified Rankin (Stroke Patients Only)       Balance Overall balance assessment: Needs assistance   Sitting balance-Leahy Scale: Fair       Standing balance-Leahy Scale: Poor                               Pertinent Vitals/Pain Pt reports sore left leg but did not rate, pt facial expression 4/10 and pt quickly closing eyes and back to sleep when static.    Home Living Family/patient expects to be discharged to:: Private residence Living Arrangements: Spouse/significant other;Children Available Help at Discharge: Family;Available PRN/intermittently Type of Home: Apartment Home Access: Stairs to enter Entrance Stairs-Rails: Right Entrance Stairs-Number of Steps: 14 Home Layout: One level Home Equipment: None      Prior Function Level of Independence: Independent         Comments: Pt normally cares for herself and kids 38mo, 1 year, 4 years     Hand Dominance        Extremity/Trunk Assessment   Upper Extremity Assessment: Generalized weakness;Difficult to assess due to impaired cognition           Lower Extremity Assessment: Generalized weakness;Difficult to assess due to impaired cognition  Cervical / Trunk Assessment: Lordotic  Communication   Communication: No difficulties  Cognition Arousal/Alertness: Lethargic Behavior During Therapy: Flat affect Overall Cognitive Status: Impaired/Different from baseline Area of Impairment: Orientation;Attention;Following commands;Safety/judgement;Problem solving Orientation Level: Disoriented to;Time Current Attention Level: Focused Memory: Decreased short-term memory Following Commands: Follows one step commands with increased time;Follows one step commands inconsistently Safety/Judgement:  Decreased awareness of deficits;Decreased awareness of safety   Problem Solving: Slow processing;Decreased initiation;Difficulty sequencing;Requires verbal cues;Requires tactile cues      General Comments      Exercises        Assessment/Plan    PT Assessment Patient needs continued PT services  PT Diagnosis Difficulty walking;Generalized weakness;Altered mental status   PT Problem List Decreased strength;Decreased cognition;Decreased activity tolerance;Decreased safety awareness;Decreased knowledge of use of DME;Decreased balance;Pain;Decreased mobility;Obesity;Decreased coordination  PT Treatment Interventions Gait training;DME instruction;Functional mobility training;Therapeutic activities;Therapeutic exercise;Patient/family education;Cognitive remediation;Balance training   PT Goals (Current goals can be found in the Care Plan section) Acute Rehab PT Goals Patient Stated Goal: be able to take care of the kids PT Goal Formulation: With patient/family Time For Goal Achievement: 09/18/13 Potential to Achieve Goals: Fair    Frequency Min 3X/week   Barriers to discharge Decreased caregiver support      Co-evaluation               End of Session Equipment Utilized During Treatment: Gait belt Activity Tolerance: Patient limited by lethargy Patient left: in chair;with call bell/phone within reach;with chair alarm set;with family/visitor present Nurse Communication: Mobility status;Precautions         Time: 1610-9604 PT Time Calculation (min): 27 min   Charges:   PT Evaluation $Initial PT Evaluation Tier I: 1 Procedure PT Treatments $Therapeutic Activity: 23-37 mins   PT G CodesDelorse Lek 09/04/2013, 9:17 AM Delaney Meigs, PT [phone]

## 2013-09-04 NOTE — Progress Notes (Signed)
Pt arrived form 5W.  Oriented and settled into the room, telemetry hooked up.  VSS.  Will continue to monitor. Sondra ComeSilva, Dmarcus Decicco M, RN 09/04/2013 3:38 PM

## 2013-09-04 NOTE — Progress Notes (Signed)
VASCULAR LAB PRELIMINARY  PRELIMINARY  PRELIMINARY  PRELIMINARY  Carotid duplex  completed.    Preliminary report:  Bilateral:  1-39% ICA stenosis.  Vertebral artery flow is antegrade.  Right:  Proximal ICA waveform is abnormal (spiked with no diastolic component.)  Mid and distal ICA waveform is within normal limits.    Louretta Tantillo, RVT 09/04/2013, 11:49 AM

## 2013-09-04 NOTE — Clinical Social Work Note (Signed)
9:50AM-CSW continues to follow this patient for d/c planning needs. CSW met with patient and patient's family (spouse and sister) at bedside to provide bed offer: [facility]. Patient's husband and sister became noticeably upset and wanted to know why patient could not go to a SNF in [place] explained SNF search/placement process for Medicaid/LOG patients to husband and sister, and explained that the patient/family were already made aware that they would have very limited options for placement. Both sister and husband stated they do not drive, as patient was the only one who is able to drive and they do not know the area as they are from [place], [place]. Husband asked if bus went to [place] where facility is located, CSW explained [employer] did not travel to [place]. Husband and sister then requested to speak with RN. CSW made Charge RN aware, who explained d/c process to family. Husband and sister repeatedly asked CSW and RN what other options were available. CSW stressed that family had been made aware on the previous date, that placement in [place] may not be possible. Patient then made CSW, RN, husband, and sister aware that she is agreeable to SNF placement with [facility]. Patient's husband and sister then stressed concern about patient receiving the best care at the facility.  Patient's sister stated they would work out the transportation to visit her in the facility the best way they can.  CSW contacted facility and confirmed bed availability. CSW to fax d/c summary once completed to facility. CSW made RN, Vaughan Basta aware.   1:45PM- CSW was made aware by RN, Vaughan Basta, patient had a stroke and will be transferred to [facility]N. CSW contacted [facility] and made facility aware. CSW to continue to follow patient for d/c planning needs.  [place], [place] Weekend Clinical Social Worker [phone]

## 2013-09-04 NOTE — Progress Notes (Signed)
[facility] PROGRESS NOTE  [name] MRN:[id number] DOB: 03/14/1975 DOA: 08/30/2013 PCP: GARBA,LAWAL, MD  Assessment/Plan: Multiple B embolic infarcts  - Noted on MRI - Neurology consulted and following - 2d echo and carotids ordered - On rectal ASA for now - Given concerns of embolic source, may require TEE - PT/OT/SLP Left thigh wound s/p surgical drainage and debridement of abscess  - Second set of blood cultures, NGTD  - Urine culture negative.  - Wound culture shows no growth. Gram stain shows abundant gm neg rods and moderate GPC in pairs.  - Continue zosyn - Discontinue hydrotherapy per [facility]CS  Sepsis.  Resolved; patient no longer meets SIRS criteria  Continue to resuscitate with IVF  Acute encephalopathy secondary to sepsis.  Resolved; alert and oriented x 3.  Advance diet to carb modified.  [place] Agitation and Sedation, Currently 0: Alert and calm  Acute respiratory failure secondary to sepsis with history of asthma.  Resolved; previously required intubation during this hospitalization.  Supplement 2L oxygen nasal cannula prn; SpO2 96%  Continue pulmicort, brovana with prn albuterol as needed  Acute kidney injury  Due to sepsis and acute infection.  Hyponatremia.  Now resolved with IVF.  Leukocytosis.  Continuing to trend down Microcytic Anemia  Baseline appears to be 13 - 14.  Hemoglobin and Hematocrit both trending up  Anemia panel pending  Diabetes Mellitus type II with hyperglycemia.  Last A1C 15.4  CBGs persistently high; this morning's reading (7/24) at 213. Continue to follow routinely.  Started ssi resistant, meal coverage and lantus qhs. Will monitor and titrate as needed  Patient to receive insulin education prior to d/c.  Nutrition consult  High triglycerides  Serum triglycerides 550.  Started gemfibrazil  Disposition:  Remain inpatient; possibly d/c tomorrow to SNF or LTAC  DVT Prophylaxis:  Prophylactic Heparin with SCDs  Code  Status: Full Family Communication: Pt in room, family at bedside (indicate person spoken with, relationship, and if by phone, the number) Disposition Plan: Pending   Consultants:  Neurology  Surgery  Procedures:    Antibiotics:  Vanc 7/20>>>7/24  Zosyn 7/20>>>  HPI/Subjective: Pt remains with decreased level of alertness.  Objective: Filed Vitals:   09/03/13 2058 09/03/13 2311 09/04/13 0455 09/04/13 0755  BP: 168/64 144/83 165/85   Pulse: 91  94   Temp: 97.8 F (36.6 C)  98.3 F (36.8 C)   TempSrc: Oral  Oral   Resp: 18  18   Height:      Weight:      SpO2: 98%  96% 96%    Intake/Output Summary (Last 24 hours) at 09/04/13 1147 Last data filed at 09/03/13 1852  Gross per 24 hour  Intake 1322.33 ml  Output      1 ml  Net 1321.33 ml   Filed Weights   08/30/13 2016 09/01/13 2134 09/02/13 0635  Weight: 135.5 kg (298 lb 11.6 oz) 144.8 kg (319 lb 3.6 oz) 144.8 kg (319 lb 3.6 oz)    Exam:   General:  Arousable, in nad  Cardiovascular: regular, s1, s2  Respiratory: normal resp effort, no wheezing  Abdomen: soft,nondistended  Musculoskeletal: perfused, no clubbing   Data Reviewed: Basic Metabolic Panel:  Recent Labs Lab 08/30/13 1429  08/31/13 0220  08/31/13 2208 09/01/13 0406 09/02/13 0530 09/03/13 0500 09/04/13 0442  NA 125*  --  131*  < > 131* 132* 136* 140 137  K 4.6  --  3.8  < > 4.0 4.0 4.0 4.2 4.7  CL 85*  --    94*  < > 100 97 101 106 103  CO2 17*  --  21  < > 20 20 22 21 15*  GLUCOSE 430*  < > 218*  < > 297* 266* 208* 171* 164*  BUN 48*  --  50*  < > 43* 42* 38* 35* 29*  CREATININE 1.85*  --  1.88*  < > 1.41* 1.38* 1.15* 1.10 1.01  CALCIUM 8.5  --  7.5*  < > 7.4* 7.7* 8.0* 7.9* 8.2*  MG  --   --  2.6*  --   --   --   --   --   --   PHOS  --   --  2.8  --   --   --   --   --   --   < > = values in this interval not displayed. Liver Function Tests:  Recent Labs Lab 08/30/13 1429 08/31/13 0220  AST 36 27  ALT 20 19  ALKPHOS 140*  154*  BILITOT 0.4 0.2*  PROT 8.1 6.9  ALBUMIN 1.7* 1.5*   No results found for this basename: LIPASE, AMYLASE,  in the last 168 hours No results found for this basename: AMMONIA,  in the last 168 hours CBC:  Recent Labs Lab 08/30/13 1429 08/31/13 0220 09/01/13 0925 09/02/13 0530 09/03/13 0500 09/04/13 0442  WBC 24.0* 21.3* 24.0* 18.1* 17.0* 11.6*  NEUTROABS 20.4*  --   --   --   --   --   HGB 11.6* 9.9* 10.1* 9.3* 9.7* 11.6*  HCT 33.6* 29.3* 30.2* 28.1* 29.4* 35.9*  MCV 78.3 78.8 80.7 78.7 80.8 83.5  PLT 173 185 237 270 335 350   Cardiac Enzymes: No results found for this basename: CKTOTAL, CKMB, CKMBINDEX, TROPONINI,  in the last 168 hours BNP (last 3 results) No results found for this basename: PROBNP,  in the last 8760 hours CBG:  Recent Labs Lab 09/03/13 0751 09/03/13 1210 09/03/13 1816 09/03/13 2101 09/04/13 0747  GLUCAP 213* 204* 168* 128* 240*    Recent Results (from the past 240 hour(s))  CULTURE, BLOOD (ROUTINE X 2)     Status: None   Collection Time    08/30/13  2:29 PM      Result Value Ref Range Status   Specimen Description BLOOD LEFT ANTECUBITAL   Final   Special Requests     Final   Value: BOTTLES DRAWN AEROBIC AND ANAEROBIC 6CC AER 5CC ANA   Culture  Setup Time     Final   Value: 08/30/2013 19:36     Performed at [facility]   Culture     Final   Value:        BLOOD CULTURE RECEIVED NO GROWTH TO DATE CULTURE WILL BE HELD FOR 5 DAYS BEFORE ISSUING A FINAL NEGATIVE REPORT     Performed at [facility]   Report Status PENDING   Incomplete  CULTURE, BLOOD (ROUTINE X 2)     Status: None   Collection Time    08/30/13  2:41 PM      Result Value Ref Range Status   Specimen Description BLOOD LEFT HAND   Final   Special Requests BOTTLES DRAWN AEROBIC AND ANAEROBIC 6CC   Final   Culture  Setup Time     Final   Value: 08/30/2013 19:36     Performed at [facility]   Culture     Final   Value: STAPHYLOCOCCUS SPECIES  (COAGULASE NEGATIVE)       Note: THE SIGNIFICANCE OF ISOLATING THIS ORGANISM FROM A SINGLE SET OF BLOOD CULTURES WHEN MULTIPLE SETS ARE DRAWN IS UNCERTAIN. PLEASE NOTIFY THE MICROBIOLOGY DEPARTMENT WITHIN ONE WEEK IF SPECIATION AND SENSITIVITIES ARE REQUIRED.     Note: Gram Stain Report Called to,Read Back By and Verified With: SARA G@12:54PM ON 08/31/13 BY DANTS     Performed at [facility]   Report Status 09/01/2013 FINAL   Final  WOUND CULTURE     Status: None   Collection Time    08/30/13  5:06 PM      Result Value Ref Range Status   Specimen Description WOUND THIGH LEFT   Final   Special Requests PATIENT ON FOLLOWING VANCOMYCIN, ZOSYN   Final   Gram Stain     Final   Value: RARE WBC PRESENT, PREDOMINANTLY MONONUCLEAR     NO SQUAMOUS EPITHELIAL CELLS SEEN     ABUNDANT GRAM NEGATIVE RODS     FEW GRAM POSITIVE COCCI IN PAIRS     IN CLUSTERS FEW GRAM POSITIVE RODS   Culture     Final   Value: NO GROWTH 2 DAYS     Performed at [facility]   Report Status 09/02/2013 FINAL   Final  ANAEROBIC CULTURE     Status: None   Collection Time    08/30/13  5:06 PM      Result Value Ref Range Status   Specimen Description WOUND THIGH LEFT   Final   Special Requests PATIENT ON FOLLOWING VANCOMYCIN, ZOSYN   Final   Gram Stain     Final   Value: RARE WBC PRESENT, PREDOMINANTLY MONONUCLEAR     NO SQUAMOUS EPITHELIAL CELLS SEEN     ABUNDANT GRAM NEGATIVE RODS     FEW GRAM POSITIVE COCCI IN PAIRS     IN CLUSTERS FEW GRAM POSITIVE RODS   Culture     Final   Value: NO ANAEROBES ISOLATED; CULTURE IN PROGRESS FOR 5 DAYS     Performed at [facility]   Report Status PENDING   Incomplete  MRSA PCR SCREENING     Status: None   Collection Time    08/30/13  7:11 PM      Result Value Ref Range Status   MRSA by PCR NEGATIVE  NEGATIVE Final   Comment:            The GeneXpert MRSA Assay (FDA     approved for NASAL specimens     only), is one component of a     comprehensive MRSA  colonization     surveillance program. It is not     intended to diagnose MRSA     infection nor to guide or     monitor treatment for     MRSA infections.  URINE CULTURE     Status: None   Collection Time    08/30/13  7:12 PM      Result Value Ref Range Status   Specimen Description URINE, CATHETERIZED   Final   Special Requests NONE   Final   Culture  Setup Time     Final   Value: 08/31/2013 00:49     Performed at [facility]   Colony Count     Final   Value: NO GROWTH     Performed at [facility]   Culture     Final   Value: NO GROWTH     Performed at [facility]   Report   Status 09/01/2013 FINAL   Final  CULTURE, BLOOD (ROUTINE X 2)     Status: None   Collection Time    09/01/13 11:10 AM      Result Value Ref Range Status   Specimen Description BLOOD LEFT ANTECUBITAL   Final   Special Requests BOTTLES DRAWN AEROBIC ONLY 9CC   Final   Culture  Setup Time     Final   Value: 09/01/2013 14:01     Performed at [facility]   Culture     Final   Value:        BLOOD CULTURE RECEIVED NO GROWTH TO DATE CULTURE WILL BE HELD FOR 5 DAYS BEFORE ISSUING A FINAL NEGATIVE REPORT     Performed at [facility]   Report Status PENDING   Incomplete  CULTURE, BLOOD (ROUTINE X 2)     Status: None   Collection Time    09/01/13 11:20 AM      Result Value Ref Range Status   Specimen Description BLOOD LEFT HAND   Final   Special Requests BOTTLES DRAWN AEROBIC ONLY 9CC   Final   Culture  Setup Time     Final   Value: 09/01/2013 14:01     Performed at [facility]   Culture     Final   Value:        BLOOD CULTURE RECEIVED NO GROWTH TO DATE CULTURE WILL BE HELD FOR 5 DAYS BEFORE ISSUING A FINAL NEGATIVE REPORT     Performed at [facility]   Report Status PENDING   Incomplete     Studies: Mr Brain Wo Contrast  09/03/2013   CLINICAL DATA:  Change in level of consciousness. Rule out stroke. Thigh abscess with sepsis.  EXAM: MRI HEAD  WITHOUT CONTRAST  TECHNIQUE: Multiplanar, multiecho pulse sequences of the brain and surrounding structures were obtained without intravenous contrast.  COMPARISON:  None.  FINDINGS: Images are mildly to moderately degraded by motion. There are numerous punctate foci of restricted diffusion involving cortex and white matter of both cerebral hemispheres consistent with acute infarcts. Several slightly larger foci of acute infarction are present in the corpus callosum measuring up to approximately 1.3 cm in size. Small, acute infarcts are also present in the right greater than left basal ganglia, and there is a punctate acute infarct anteriorly in the right aspect of the medulla. There may be a small amount of hemorrhage associated with a few of these punctate infarcts. Old infarcts are present in the bilateral basal ganglia and bilateral cerebellum.  There is no mass, midline shift, or extra-axial fluid collection. There is mild cerebral and cerebellar atrophy. Orbits are unremarkable. Minimal ethmoid air cell mucosal thickening is noted. Mastoid air cells are clear.  IMPRESSION: 1. Multiple small infarcts involving both cerebral hemispheres, corpus callosum, and medulla, consistent with embolic infarcts. These could be bland or septic emboli given history. 2. 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   Electronically Signed   By: Allen  Grady   On: 09/03/2013 17:38    Scheduled Meds: .  stroke: mapping our early stages of recovery book   Does not apply Once  . antiseptic oral rinse  15 mL Mouth Rinse QID  . arformoterol  15 mcg Nebulization Q12H  . aspirin  300 mg Rectal Daily  . budesonide (PULMICORT) nebulizer solution  0.25 mg Nebulization Q12H  . chlorhexidine  15 mL Mouth Rinse BID  . feeding supplement (PRO-STAT SUGAR FREE 64)  30   mL Oral BID WC  . gemfibrozil  600 mg Oral BID AC  . heparin  5,000 Units Subcutaneous 3 times per day  . insulin aspart  0-20 Units Subcutaneous TID WC  . insulin  aspart  6 Units Subcutaneous TID WC  . insulin glargine  22 Units Subcutaneous Daily  . insulin starter kit- syringes  1 kit Other Once  . piperacillin-tazobactam (ZOSYN)  IV  3.375 g Intravenous Q8H   Continuous Infusions: . sodium chloride 10 mL/hr at 09/03/13 0853    Active Problems:   Thigh abscess   Sepsis   Diabetes mellitus with hyperglycemia   AKI (acute kidney injury)   Acute respiratory failure with hypoxia  Time spent: 40min  ,  K  [facility] Pager [phone]. If 7PM-7AM, please contact night-coverage at [email or web address], password [email or web address] 09/04/2013, 11:47 AM  LOS: 5 days      

## 2013-09-04 NOTE — Evaluation (Signed)
SLP Cancellation Note  Patient Details Name: [name] MRN: [id number] DOB: 07/15/1975   Cancelled treatment:       Reason Eval/Treat Not Completed: Other (comment) (order for SLE received for 7/26)   Linda Burnetamara Maddon Horton, MS [facility] SLP [phone]

## 2013-09-05 ENCOUNTER — Inpatient Hospital Stay (HOSPITAL_COMMUNITY): Payer: Medicaid Other

## 2013-09-05 ENCOUNTER — Encounter (HOSPITAL_COMMUNITY): Payer: Self-pay | Admitting: Radiology

## 2013-09-05 DIAGNOSIS — I517 Cardiomegaly: Secondary | ICD-10-CM

## 2013-09-05 DIAGNOSIS — R609 Edema, unspecified: Secondary | ICD-10-CM

## 2013-09-05 LAB — URINALYSIS W MICROSCOPIC (NOT AT ARMC)
Bilirubin Urine: NEGATIVE
Glucose, UA: NEGATIVE mg/dL
KETONES UR: NEGATIVE mg/dL
NITRITE: NEGATIVE
PH: 5 (ref 5.0–8.0)
Protein, ur: 100 mg/dL — AB
SPECIFIC GRAVITY, URINE: 1.021 (ref 1.005–1.030)
Urobilinogen, UA: 0.2 mg/dL (ref 0.0–1.0)

## 2013-09-05 LAB — GLUCOSE, CAPILLARY
GLUCOSE-CAPILLARY: 192 mg/dL — AB (ref 70–99)
Glucose-Capillary: 162 mg/dL — ABNORMAL HIGH (ref 70–99)
Glucose-Capillary: 242 mg/dL — ABNORMAL HIGH (ref 70–99)
Glucose-Capillary: 212 mg/dL — ABNORMAL HIGH (ref 70–99)

## 2013-09-05 LAB — BASIC METABOLIC PANEL
ANION GAP: 19 — AB (ref 5–15)
BUN: 11 mg/dL (ref 6–23)
CHLORIDE: 96 meq/L (ref 96–112)
CO2: 23 meq/L (ref 19–32)
CREATININE: 0.75 mg/dL (ref 0.50–1.10)
Calcium: 8.8 mg/dL (ref 8.4–10.5)
GFR calc Af Amer: >90 mL/min (ref >90)
GFR calc non Af Amer: >90 mL/min (ref >90)
Glucose, Bld: 152 mg/dL — ABNORMAL HIGH (ref 70–99)
POTASSIUM: 3.7 meq/L (ref 3.7–5.3)
Sodium: 138 mEq/L (ref 137–147)

## 2013-09-05 LAB — LIPID PANEL
CHOL/HDL RATIO: 3.5 ratio
CHOLESTEROL: 215 mg/dL — AB (ref 0–200)
HDL: 61 mg/dL (ref >39)
LDL Cholesterol: 130 mg/dL — ABNORMAL HIGH (ref 0–99)
Triglycerides: 120 mg/dL (ref <150)
VLDL: 24 mg/dL (ref 0–40)

## 2013-09-05 LAB — CULTURE, BLOOD (ROUTINE X 2): CULTURE: NO GROWTH

## 2013-09-05 LAB — HEMOGLOBIN A1C
Hgb A1c MFr Bld: 9 % — ABNORMAL HIGH (ref <5.7)
Mean Plasma Glucose: 212 mg/dL — ABNORMAL HIGH (ref <117)

## 2013-09-05 LAB — TSH: TSH: 1.24 u[IU]/mL (ref 0.350–4.500)

## 2013-09-05 LAB — T4, FREE: Free T4: 0.82 ng/dL (ref 0.80–1.80)

## 2013-09-05 LAB — VITAMIN B12: VITAMIN B 12: 394 pg/mL (ref 211–911)

## 2013-09-05 MED ORDER — ASPIRIN EC 325 MG PO TBEC
325.0000 mg | DELAYED_RELEASE_TABLET | Freq: Every day | ORAL | Status: DC
  Administered 2013-09-06 – 2013-09-13 (×8): 325 mg via ORAL
  Filled 2013-09-05 (×9): qty 1

## 2013-09-05 MED ORDER — ATORVASTATIN CALCIUM 10 MG PO TABS
20.0000 mg | ORAL_TABLET | Freq: Every day | ORAL | Status: DC
  Administered 2013-09-05 – 2013-09-12 (×8): 20 mg via ORAL
  Filled 2013-09-05 (×8): qty 2

## 2013-09-05 MED ORDER — SODIUM CHLORIDE 0.9 % IJ SOLN
10.0000 mL | INTRAMUSCULAR | Status: DC | PRN
  Administered 2013-09-06 – 2013-09-09 (×5): 10 mL

## 2013-09-05 MED ORDER — SODIUM CHLORIDE 0.9 % IJ SOLN: 10.0000 mL | INTRAMUSCULAR | Status: DC | PRN

## 2013-09-05 MED ORDER — IOHEXOL 350 MG/ML SOLN
50.0000 mL | Freq: Once | INTRAVENOUS | Status: AC | PRN
  Administered 2013-09-05: 50 mL via INTRAVENOUS

## 2013-09-05 NOTE — Progress Notes (Signed)
OT Cancellation Note  Patient Details Name: Linda HarmsDavenia [name] MRN: [id number] DOB: 10/18/1975   Cancelled Treatment:    Reason Eval/Treat Not Completed: Other (comment) (Sterile procedure in process.)  Earlie RavelingStraub, Hemi Chacko L  OTR/L [phone]  09/05/2013, 1:08 PM

## 2013-09-05 NOTE — Progress Notes (Signed)
Patient expressed desire to go home; pt. Informed simply that going home would imperil her life.  Her medical diagnosis was severe and could possibly worsen her condition were she to leave the hospital prematurely.  Had to also instruct family not to be getting patient up to bedside as patient is on complete bedrest.  Informed family that only staff can get patient up.  Did some education regarding nutritional exchanges in carbohydrate modified diets.  Family was asking why she couldn't have banana pudding.  Nurse pointed out that patient ordered hamburger and french fries for supper. Patient was only allotted so many carbohydrates per meal. Family will require reinforce in education.

## 2013-09-05 NOTE — Progress Notes (Signed)
Pt's husband requested information about signing the pt up for disability.  Order placed for CSW consult. Sondra ComeSilva, Sladen Plancarte M, RN 09/05/2013 6:37 PM

## 2013-09-05 NOTE — Progress Notes (Signed)
Stroke Team Progress Note  HISTORY Mickenzie Stolar is a 38 y.o. female with diabetes mellitus, hypertension, previous stroke and morbid obesity who was admitted on 08/30/2013 for management of left lower extremity abscess complicated by sepsis and hyperglycemia. Abscess was incised and drained on the day of admission. Patient remained intubated postop and was placed on mechanical ventilation. She was subsequently extubated on 08/31/2013. She continued to demonstrate mental status changes and an MRI was obtained on 09/03/2013 which showed multiple small infarcts involving right and left hemispheres, corpus callosum and medulla with a pattern consistent with embolic phenomena. Patient has not demonstrated a change in speech nor focal motor abnormalities. NIH stroke score was 2 for mental status changes.   LSN: Unclear  tPA Given: No: No clear when last known well; no objective focal deficits.  MRankin: 3  SUBJECTIVE No family members present. The patient is anxious for discharge. She feels that most of her deficits are from her previous stroke many years ago.  OBJECTIVE Most recent Vital Signs: Filed Vitals:   09/05/13 0228 09/05/13 0525 09/05/13 0854 09/05/13 0920  BP: 184/96 190/96  185/101  Pulse: 100 99  103  Temp: 98.5 F (36.9 C) 97.9 F (36.6 C)  98.3 F (36.8 C)  TempSrc: Oral Oral  Oral  Resp: _0 Height:      Weight:      SpO2: 95% 99% 99% 100%   CBG (last 3)   Recent Labs  09/04/13 1618 09/04/13 2256 09/05/13 0653  GLUCAP 202* 126* 162*    IV Fluid Intake:   . sodium chloride 10 mL/hr at 09/04/13 1734    MEDICATIONS  .  stroke: mapping our early stages of recovery book   Does not apply Once  . antiseptic oral rinse  15 mL Mouth Rinse QID  . arformoterol  15 mcg Nebulization Q12H  . aspirin  300 mg Rectal Daily  . budesonide (PULMICORT) nebulizer solution  0.25 mg Nebulization Q12H  . chlorhexidine  15 mL Mouth Rinse BID  . feeding supplement (PRO-STAT  SUGAR FREE 64)  30 mL Oral BID WC  . gemfibrozil  600 mg Oral BID AC  . heparin  5,000 Units Subcutaneous 3 times per day  . insulin aspart  0-20 Units Subcutaneous TID WC  . insulin aspart  6 Units Subcutaneous TID WC  . insulin glargine  22 Units Subcutaneous Daily  . insulin starter kit- syringes  1 kit Other Once  . piperacillin-tazobactam (ZOSYN)  IV  3.375 g Intravenous Q8H   PRN:  acetaminophen, acetaminophen, albuterol, hydrALAZINE  Diet:  Carb Control thin liquids Activity:  Bedrest DVT Prophylaxis:  SCDs / subcutaneous heparin  CLINICALLY SIGNIFICANT STUDIES Basic Metabolic Panel:  Recent Labs Lab 08/30/13 1429  08/31/13 0220  09/04/13 0442 09/05/13 0549  NA 125*  --  131*  < > 137 138  K 4.6  --  3.8  < > 4.7 3.7  CL 85*  --  94*  < > 103 96  CO2 17*  --  21  < > 15* 23  GLUCOSE 430*  < > 218*  < > 164* 152*  BUN 48*  --  50*  < > 29* 11  CREATININE 1.85*  --  1.88*  < > 1.01 0.75  CALCIUM 8.5  --  7.5*  < > 8.2* 8.8  MG  --   --  2.6*  --   --   --   PHOS  --   --  2.8  --   --   --   < > = values in this interval not displayed. Liver Function Tests:  Recent Labs Lab 08/30/13 1429 08/31/13 0220  AST 36 27  ALT 20 19  ALKPHOS 140* 154*  BILITOT 0.4 0.2*  PROT 8.1 6.9  ALBUMIN 1.7* 1.5*   CBC:  Recent Labs Lab 08/30/13 1429  09/03/13 0500 09/04/13 0442  WBC 24.0*  < > 17.0* 11.6*  NEUTROABS 20.4*  --   --   --   HGB 11.6*  < > 9.7* 11.6*  HCT 33.6*  < > 29.4* 35.9*  MCV 78.3  < > 80.8 83.5  PLT 173  < > 335 350  < > = values in this interval not displayed. Coagulation: No results found for this basename: LABPROT, INR,  in the last 168 hours Cardiac Enzymes: No results found for this basename: CKTOTAL, CKMB, CKMBINDEX, TROPONINI,  in the last 168 hours Urinalysis: No results found for this basename: COLORURINE, APPERANCEUR, LABSPEC, PHURINE, GLUCOSEU, HGBUR, BILIRUBINUR, KETONESUR, PROTEINUR, UROBILINOGEN, NITRITE, LEUKOCYTESUR,  in the last 168  hours Lipid Panel    Component Value Date/Time   CHOL 215* 09/05/2013 0549   TRIG 120 09/05/2013 0549   HDL 61 09/05/2013 0549   CHOLHDL 3.5 09/05/2013 0549   VLDL 24 09/05/2013 0549   LDLCALC 130* 09/05/2013 0549   HgbA1C  Lab Results  Component Value Date   HGBA1C 15.4* 08/31/2013    Urine Drug Screen:     Component Value Date/Time   LABOPIA NEG 08/26/2012 1251   COCAINSCRNUR NEG 08/26/2012 1251   LABBENZ NEG 08/26/2012 1251   AMPHETMU NEG 08/26/2012 1251    Alcohol Level: No results found for this basename: ETH,  in the last 168 hours  Mr Brain Wo Contrast 09/03/2013    1. Multiple small infarcts involving both cerebral hemispheres, corpus callosum, and medulla, consistent with embolic infarcts. These could be bland or septic emboli given history.  2. 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     CT Angiogram of the head and neck - pending  Carotid Doppler  Preliminary report: Bilateral: 1-39% ICA stenosis. Vertebral artery flow is antegrade. Right: Proximal ICA waveform is abnormal (spiked with no diastolic component.) Mid and distal ICA waveform is within normal limits.  2D Echocardiogram  pending  CXR  08/31/2013 - Enteric tube is seen ending at the body of the stomach, with the sideport at the fundus of the stomach.  EKG - ST rate 107 beats per minute.  For complete results please see formal report.   Therapy Recommendations the physical therapist recommends skilled nursing facility placement.  Physical Exam   Neurologic Examination:  Mental Status:  Alert and oriented to place and people but stated 2013 and did not know the month. Mild naming difficulties. Speech fluent without evidence of aphasia. Able to follow commands. Able to repeat. Cranial Nerves:  II-Visual fields were normal.  III/IV/VI-Pupils were equal and reacted. disconjugated gaze, but Extraocular movements were full with some exotropia.  V/VII-right nasolabial fold flattening.  VIII-normal.  X-normal  speech and symmetrical palatal movement.  Motor: 4+/5 bilaterally with normal tone and bulk. Fine motor movements performed with difficulty on the right. Sensory: symmetrical throughout.  Deep Tendon Reflexes: Absent at ankles.  Plantars: Flexor bilaterally  Cerebellar: Normal finger-to-nose testing on the left. Mild dysmetria on the right.  ASSESSMENT Ms. Shaivi Rothschild is a 38 y.o. female presenting with mental status changes. T-PA therapy was not initiated as  the time of onset was not known and there were no objective focal deficits. MRI Multiple small infarcts involving both cerebral hemispheres, corpus callosum, and medulla, consistent with embolic infarcts. The pattern of stroke is consistent with cardioembolic stroke. Further work up needed to rule out endocarditis in the setting of sepsis and infection. So far blood culture negative. Also pt has wound at LLE, bilateral DVT needs to be ruled out too. If those are all negative, we then need to consider hypercoagulable work up and TCD bubble study. She does have multiple other stroke risk factors including HTN, DM, HLD, obesity. On no antithrombotics prior to admission. Now on Aspirin suppository 300 mg daily for secondary stroke prevention. Patient with improving mental status changes, seizure unlikely. Stroke work up underway.   Sepsis with LLE wound abscess - consider TEE to rule out endocarditis   Abnormal right internal carotid artery by Doppler. CTA of the head and neck pending.  Diabetes mellitus - hemoglobin A1c 15.4  Cholesterol 215; LDL 130 - no statin therapy admission. Currently on Lopid. Needs statin.  Previous stroke.  Hypertension history  Morbid obesity  Left lower extremity abscess - need to rule out DVT  Hospital day # 6  TREATMENT/PLAN  Consider TEE to rule out endocarditis (relayed to Dr. Sherral Hammers). Continue abx for LLE wound and sepsis.  LE venous doppler ordered to rule out DVT which can cause paradoxic emboli  in the setting of PFO/ASD  Change to PO  aspirin 325 mg orally every day for secondary stroke prevention.  Add Lipitor for HLD and stroke prevention  Risk factor modification  - Needs better diabetic control.  Physical therapist recommends skilled nursing facility placement.  Await 2-D echo   Pt has no vessel imaging and carotid doppler concerning for proximal ICA stenosis, ordered CT angiogram of the head and neck  Mikey Bussing PA-C [facility] Pager [phone] 09/05/2013, 12:39 PM   SIGNED I, the attending vascular neurologist, have personally obtained a history, examined the patient, evaluated laboratory data, individually viewed imaging studies, and formulated the assessment and plan of care.  I have made any additions or clarifications directly to the above note and agree with the findings and plan as currently documented.   Rosalin Hawking, MD PhD 09/05/2013 1:08 PM   To contact Stroke Continuity provider, please refer to [email or web address] After hours, contact General Neurology

## 2013-09-05 NOTE — Progress Notes (Signed)
[facility] PROGRESS NOTE  [name] OIN:[id number] DOB: Jun 14, 1975 DOA: 08/30/2013 PCP: Barbette Merino, MD  Assessment/Plan: Multiple B embolic infarcts  - Noted on MRI - CTA head and neck pending - Neurology consulted and following, appreciate recs - 2d echo ordered and is pending - B carotid doppler with abnormal finding? Await CTA neck - On rectal ASA for now - Given concerns of embolic source, will likley require TEE as recommended by Neurology - PT/OT/SLP Left thigh wound s/p surgical drainage and debridement of abscess  - Second set of blood cultures, NGTD  - Urine culture negative.  - Wound culture shows no growth. Gram stain shows abundant gm neg rods and moderate GPC in pairs.  - Continue zosyn - Discontinued hydrotherapy per [facility]  Sepsis.  Resolved; patient no longer meets SIRS criteria  Acute encephalopathy secondary to Sepsis vs CVA Stable, w/o significant change Acute respiratory failure secondary to sepsis with history of asthma.  Resolved; previously required intubation during this hospitalization.  Supplement 2L oxygen nasal cannula prn; SpO2 96%  Continue pulmicort, brovana with prn albuterol as needed  Acute kidney injury  Due to sepsis and acute infection.  Much improved Hyponatremia.  Now resolved with IVF.  Leukocytosis.  Continuing to trend down Microcytic Anemia  Baseline appears to be 13 - 14.  Hemoglobin and Hematocrit both trending up  Anemia panel pending  Diabetes Mellitus type II with hyperglycemia.  Last A1C 15.4  CBGs persistently high; this morning's reading (7/24) at 213. Continue to follow routinely.  Started ssi resistant, meal coverage and lantus qhs. Will monitor and titrate as needed  Patient to receive insulin education prior to d/c.  Nutrition consult  High triglycerides  Serum triglycerides 550.  Started gemfibrazil  Disposition:  Remain inpatient; possibly d/c tomorrow to SNF or LTAC  DVT Prophylaxis:  Prophylactic  Heparin with SCDs  Code Status: Full Family Communication: Pt in room Disposition Plan: Pending   Consultants:  Neurology  Surgery  Procedures:    Antibiotics:  Vanc 7/20>>>7/24  Zosyn 7/20>>>  HPI/Subjective: No acute events noted overnight  Objective: Filed Vitals:   09/05/13 0228 09/05/13 0525 09/05/13 0854 09/05/13 0920  BP: 184/96 190/96  185/101  Pulse: 100 99  103  Temp: 98.5 F (36.9 C) 97.9 F (36.6 C)  98.3 F (36.8 C)  TempSrc: Oral Oral  Oral  Resp: $Remo'18 18  18  'LFozq$ Height:      Weight:      SpO2: 95% 99% 99% 100%    Intake/Output Summary (Last 24 hours) at 09/05/13 1158 Last data filed at 09/05/13 0730  Gross per 24 hour  Intake      0 ml  Output    401 ml  Net   -401 ml   Filed Weights   08/30/13 2016 09/01/13 2134 09/02/13 0635  Weight: 135.5 kg (298 lb 11.6 oz) 144.8 kg (319 lb 3.6 oz) 144.8 kg (319 lb 3.6 oz)    Exam:   General:  Arousable, in nad, follows command  Cardiovascular: regular, s1, s2  Respiratory: normal resp effort, no wheezing  Abdomen: soft,nondistended  Musculoskeletal: perfused, no clubbing   Data Reviewed: Basic Metabolic Panel:  Recent Labs Lab 08/30/13 1429  08/31/13 0220  09/01/13 0406 09/02/13 0530 09/03/13 0500 09/04/13 0442 09/05/13 0549  NA 125*  --  131*  < > 132* 136* 140 137 138  K 4.6  --  3.8  < > 4.0 4.0 4.2 4.7 3.7  CL 85*  --  94*  < >  97 101 106 103 96  CO2 17*  --  21  < > $R'20 22 21 'Gq$ 15* 23  GLUCOSE 430*  < > 218*  < > 266* 208* 171* 164* 152*  BUN 48*  --  50*  < > 42* 38* 35* 29* 11  CREATININE 1.85*  --  1.88*  < > 1.38* 1.15* 1.10 1.01 0.75  CALCIUM 8.5  --  7.5*  < > 7.7* 8.0* 7.9* 8.2* 8.8  MG  --   --  2.6*  --   --   --   --   --   --   PHOS  --   --  2.8  --   --   --   --   --   --   < > = values in this interval not displayed. Liver Function Tests:  Recent Labs Lab 08/30/13 1429 08/31/13 0220  AST 36 27  ALT 20 19  ALKPHOS 140* 154*  BILITOT 0.4 0.2*  PROT 8.1  6.9  ALBUMIN 1.7* 1.5*   No results found for this basename: LIPASE, AMYLASE,  in the last 168 hours No results found for this basename: AMMONIA,  in the last 168 hours CBC:  Recent Labs Lab 08/30/13 1429 08/31/13 0220 09/01/13 0925 09/02/13 0530 09/03/13 0500 09/04/13 0442  WBC 24.0* 21.3* 24.0* 18.1* 17.0* 11.6*  NEUTROABS 20.4*  --   --   --   --   --   HGB 11.6* 9.9* 10.1* 9.3* 9.7* 11.6*  HCT 33.6* 29.3* 30.2* 28.1* 29.4* 35.9*  MCV 78.3 78.8 80.7 78.7 80.8 83.5  PLT 173 185 237 270 335 350   Cardiac Enzymes: No results found for this basename: CKTOTAL, CKMB, CKMBINDEX, TROPONINI,  in the last 168 hours BNP (last 3 results) No results found for this basename: PROBNP,  in the last 8760 hours CBG:  Recent Labs Lab 09/04/13 0747 09/04/13 1338 09/04/13 1618 09/04/13 2256 09/05/13 0653  GLUCAP 240* 233* 202* 126* 162*    Recent Results (from the past 240 hour(s))  CULTURE, BLOOD (ROUTINE X 2)     Status: None   Collection Time    08/30/13  2:29 PM      Result Value Ref Range Status   Specimen Description BLOOD LEFT ANTECUBITAL   Final   Special Requests     Final   Value: BOTTLES DRAWN AEROBIC AND ANAEROBIC 6CC AER 5CC ANA   Culture  Setup Time     Final   Value: 08/30/2013 19:36     Performed at [facility]   Culture     Final   Value:        BLOOD CULTURE RECEIVED NO GROWTH TO DATE CULTURE WILL BE HELD FOR 5 DAYS BEFORE ISSUING A FINAL NEGATIVE REPORT     Performed at [facility]   Report Status PENDING   Incomplete  CULTURE, BLOOD (ROUTINE X 2)     Status: None   Collection Time    08/30/13  2:41 PM      Result Value Ref Range Status   Specimen Description BLOOD LEFT HAND   Final   Special Requests BOTTLES DRAWN AEROBIC AND ANAEROBIC [facility]   Final   Culture  Setup Time     Final   Value: 08/30/2013 19:36     Performed at [facility]   Culture     Final   Value: STAPHYLOCOCCUS SPECIES (COAGULASE NEGATIVE)     Note: THE  SIGNIFICANCE OF ISOLATING THIS ORGANISM FROM A SINGLE SET OF BLOOD CULTURES WHEN MULTIPLE SETS ARE DRAWN IS UNCERTAIN. PLEASE NOTIFY THE MICROBIOLOGY DEPARTMENT WITHIN ONE WEEK IF SPECIATION AND SENSITIVITIES ARE REQUIRED.     Note: Gram Stain Report Called to,Read Back By and Verified With: SARA G$RemoveBef'@12'[email or web address]$ :54PM ON 08/31/13 BY DANTS     Performed at [facility]   Report Status 09/01/2013 FINAL   Final  WOUND CULTURE     Status: None   Collection Time    08/30/13  5:06 PM      Result Value Ref Range Status   Specimen Description WOUND THIGH LEFT   Final   Special Requests PATIENT ON FOLLOWING VANCOMYCIN, ZOSYN   Final   Gram Stain     Final   Value: RARE WBC PRESENT, PREDOMINANTLY MONONUCLEAR     NO SQUAMOUS EPITHELIAL CELLS SEEN     ABUNDANT GRAM NEGATIVE RODS     FEW GRAM POSITIVE COCCI IN PAIRS     IN CLUSTERS FEW GRAM POSITIVE RODS   Culture     Final   Value: NO GROWTH 2 DAYS     Performed at [facility]   Report Status 09/02/2013 FINAL   Final  ANAEROBIC CULTURE     Status: None   Collection Time    08/30/13  5:06 PM      Result Value Ref Range Status   Specimen Description WOUND THIGH LEFT   Final   Special Requests PATIENT ON FOLLOWING VANCOMYCIN, ZOSYN   Final   Gram Stain     Final   Value: RARE WBC PRESENT, PREDOMINANTLY MONONUCLEAR     NO SQUAMOUS EPITHELIAL CELLS SEEN     ABUNDANT GRAM NEGATIVE RODS     FEW GRAM POSITIVE COCCI IN PAIRS     IN CLUSTERS FEW GRAM POSITIVE RODS   Culture     Final   Value: NO ANAEROBES ISOLATED; CULTURE IN PROGRESS FOR 5 DAYS     Performed at [facility]   Report Status PENDING   Incomplete  MRSA PCR SCREENING     Status: None   Collection Time    08/30/13  7:11 PM      Result Value Ref Range Status   MRSA by PCR NEGATIVE  NEGATIVE Final   Comment:            The GeneXpert MRSA Assay (FDA     approved for NASAL specimens     only), is one component of a     comprehensive MRSA colonization     surveillance  program. It is not     intended to diagnose MRSA     infection nor to guide or     monitor treatment for     MRSA infections.  URINE CULTURE     Status: None   Collection Time    08/30/13  7:12 PM      Result Value Ref Range Status   Specimen Description URINE, CATHETERIZED   Final   Special Requests NONE   Final   Culture  Setup Time     Final   Value: 08/31/2013 00:49     Performed at [place]     Final   Value: NO GROWTH     Performed at [facility]   Culture     Final   Value: NO GROWTH     Performed at [facility]   Report Status 09/01/2013  FINAL   Final  CULTURE, BLOOD (ROUTINE X 2)     Status: None   Collection Time    09/01/13 11:10 AM      Result Value Ref Range Status   Specimen Description BLOOD LEFT ANTECUBITAL   Final   Special Requests BOTTLES DRAWN AEROBIC ONLY [facility]   Final   Culture  Setup Time     Final   Value: 09/01/2013 14:01     Performed at [facility]   Culture     Final   Value:        BLOOD CULTURE RECEIVED NO GROWTH TO DATE CULTURE WILL BE HELD FOR 5 DAYS BEFORE ISSUING A FINAL NEGATIVE REPORT     Performed at [facility]   Report Status PENDING   Incomplete  CULTURE, BLOOD (ROUTINE X 2)     Status: None   Collection Time    09/01/13 11:20 AM      Result Value Ref Range Status   Specimen Description BLOOD LEFT HAND   Final   Special Requests BOTTLES DRAWN AEROBIC ONLY [place]   Final   Culture  Setup Time     Final   Value: 09/01/2013 14:01     Performed at [facility]   Culture     Final   Value:        BLOOD CULTURE RECEIVED NO GROWTH TO DATE CULTURE WILL BE HELD FOR 5 DAYS BEFORE ISSUING A FINAL NEGATIVE REPORT     Performed at [facility]   Report Status PENDING   Incomplete     Studies: Mr Brain Wo Contrast  09/03/2013   CLINICAL DATA:  Change in level of consciousness. Rule out stroke. Thigh abscess with sepsis.  EXAM: MRI HEAD WITHOUT CONTRAST  TECHNIQUE:  Multiplanar, multiecho pulse sequences of the brain and surrounding structures were obtained without intravenous contrast.  COMPARISON:  None.  FINDINGS: Images are mildly to moderately degraded by motion. There are numerous punctate foci of restricted diffusion involving cortex and white matter of both cerebral hemispheres consistent with acute infarcts. Several slightly larger foci of acute infarction are present in the corpus callosum measuring up to approximately 1.3 cm in size. Small, acute infarcts are also present in the right greater than left basal ganglia, and there is a punctate acute infarct anteriorly in the right aspect of the medulla. There may be a small amount of hemorrhage associated with a few of these punctate infarcts. Old infarcts are present in the bilateral basal ganglia and bilateral cerebellum.  There is no mass, midline shift, or extra-axial fluid collection. There is mild cerebral and cerebellar atrophy. Orbits are unremarkable. Minimal ethmoid air cell mucosal thickening is noted. Mastoid air cells are clear.  IMPRESSION: 1. Multiple small infarcts involving both cerebral hemispheres, corpus callosum, and medulla, consistent with embolic infarcts. These could be bland or septic emboli given history. 2. 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   Electronically Signed   By: Logan Bores   On: 09/03/2013 17:38    Scheduled Meds: .  stroke: mapping our early stages of recovery book   Does not apply Once  . antiseptic oral rinse  15 mL Mouth Rinse QID  . arformoterol  15 mcg Nebulization Q12H  . aspirin  300 mg Rectal Daily  . budesonide (PULMICORT) nebulizer solution  0.25 mg Nebulization Q12H  . chlorhexidine  15 mL Mouth Rinse BID  . feeding supplement (PRO-STAT SUGAR FREE 64)  30 mL Oral  BID WC  . gemfibrozil  600 mg Oral BID AC  . heparin  5,000 Units Subcutaneous 3 times per day  . insulin aspart  0-20 Units Subcutaneous TID WC  . insulin aspart  6 Units Subcutaneous TID  WC  . insulin glargine  22 Units Subcutaneous Daily  . insulin starter kit- syringes  1 kit Other Once  . piperacillin-tazobactam (ZOSYN)  IV  3.375 g Intravenous Q8H   Continuous Infusions: . sodium chloride 10 mL/hr at 09/04/13 1734    Active Problems:   Thigh abscess   Sepsis   Diabetes mellitus with hyperglycemia   AKI (acute kidney injury)   Acute respiratory failure with hypoxia   Cerebral embolism with cerebral infarction  Time spent: 68min  Kaileb Monsanto, [facility] Pager [phone]. If 7PM-7AM, please contact night-coverage at [email or web address], password [facility] 09/05/2013, 11:58 AM  LOS: 6 days

## 2013-09-05 NOTE — Progress Notes (Signed)
  Echocardiogram  2D Echocardiogram has been performed.  Leta JunglingCooper, Shawntina Diffee M 09/05/2013, 10:20 AM

## 2013-09-05 NOTE — Progress Notes (Signed)
ANTIBIOTIC CONSULT NOTE - FOLLOW UP  Pharmacy Consult for Zosyn Indication: thigh abscess  Allergies  Allergen Reactions  . Azithromycin Other (See Comments)    Nose bleeding event    Patient Measurements: Height: 5\' 1"  (154.9 cm) Weight: 319 lb 3.6 oz (144.8 kg) IBW/kg (Calculated) : 47.8  Vital Signs: Temp: 98.3 F (36.8 C) (07/26 0920) Temp src: Oral (07/26 0920) BP: 185/101 mmHg (07/26 0920) Pulse Rate: 103 (07/26 0920) Intake/Output from previous day: 07/25 0701 - 07/26 0700 In: 0  Out: 400 [Urine:400] Intake/Output from this shift:    Labs:  Recent Labs  09/03/13 0500 09/04/13 0442  WBC 17.0* 11.6*  HGB 9.7* 11.6*  PLT 335 350  CREATININE 1.10 1.01   Estimated Creatinine Clearance: 105.3 ml/min (by C-G formula based on Cr of 1.01).  Assessment: 37yom continues on day #7 zosyn for thigh abscess s/p I&D on 7/20. Renal function is stable.  Vancomycin 7/20 >> 7/24 Zosyn 7/20 >>  7/20 WCx >> GNR, few GPC 7/20 BCx >> 1/2 CoNS (deemed contaminant) 7/20 UCx >> NG 7/22 BCx>>NGTD  Goal of Therapy:  Appropriate zosyn dosing  Plan:  1) Continue zosyn 3.375g IV q8 (4 hour infusion) 2) Pharmacy will sign off, please re-consult if needed  Fredrik RiggerMarkle, Kareemah Grounds Sue 09/05/2013,9:34 AM

## 2013-09-05 NOTE — Progress Notes (Signed)
VASCULAR LAB PRELIMINARY  PRELIMINARY  PRELIMINARY  PRELIMINARY  Bilateral lower extremity venous Dopplers completed.    Preliminary report:  There is no obvious evidence of DVT or SVT noted in the bilateral lower extremities.   [name], RVT 09/05/2013, 11:00 AM

## 2013-09-05 NOTE — Progress Notes (Addendum)
Peripherally Inserted Central Catheter/Midline Placement  The IV Nurse has discussed with the patient and/or persons authorized to consent for the patient, the purpose of this procedure and the potential benefits and risks involved with this procedure.  The benefits include less needle sticks, lab draws from the catheter and patient may be discharged home with the catheter.  Risks include, but not limited to, infection, bleeding, blood clot (thrombus formation), and puncture of an artery; nerve damage and irregular heat beat.  Alternatives to this procedure were also discussed. Husband and sister present at bedside during explanation of risks and benefits.  Husband and wife also verbalize agreement for procedure.  PICC/Midline Placement Documentation  PICC / Midline Single Lumen 09/05/13 PICC Left Basilic 47 cm 3 cm (Active)  Indication for Insertion or Continuance of Line Limited venous access - need for IV therapy >5 days (PICC only) 09/05/2013  1:48 PM  Exposed Catheter (cm) 3 cm 09/05/2013  1:48 PM  Site Assessment Clean;Dry;Intact 09/05/2013  1:48 PM  Line Status Saline locked;Blood return noted 09/05/2013  1:48 PM  Dressing Type Transparent 09/05/2013  1:48 PM  Dressing Status Clean;Dry;Intact;Antimicrobial disc in place 09/05/2013  1:48 PM  Line Care Connections checked and tightened 09/05/2013  1:48 PM  Dressing Intervention New dressing 09/05/2013  1:48 PM  Dressing Change Due 09/12/13 09/05/2013  1:48 PM       Elliot Dallyiggs, Franciszek Platten Wright 09/05/2013, 1:49 PM

## 2013-09-06 LAB — GLUCOSE, CAPILLARY
GLUCOSE-CAPILLARY: 196 mg/dL — AB (ref 70–99)
GLUCOSE-CAPILLARY: 135 mg/dL — AB (ref 70–99)
Glucose-Capillary: 245 mg/dL — ABNORMAL HIGH (ref 70–99)
Glucose-Capillary: 167 mg/dL — ABNORMAL HIGH (ref 70–99)

## 2013-09-06 LAB — ANAEROBIC CULTURE

## 2013-09-06 LAB — CBC
HCT: 28.5 % — ABNORMAL LOW (ref 36.0–46.0)
HEMOGLOBIN: 9.4 g/dL — AB (ref 12.0–15.0)
MCH: 26.9 pg (ref 26.0–34.0)
MCHC: 33 g/dL (ref 30.0–36.0)
MCV: 81.7 fL (ref 78.0–100.0)
Platelets: 406 10*3/uL — ABNORMAL HIGH (ref 150–400)
RBC: 3.49 MIL/uL — AB (ref 3.87–5.11)
RDW: 14.3 % (ref 11.5–15.5)
WBC: 14.6 10*3/uL — ABNORMAL HIGH (ref 4.0–10.5)

## 2013-09-06 LAB — BASIC METABOLIC PANEL
Anion gap: 12 (ref 5–15)
BUN: 23 mg/dL (ref 6–23)
CALCIUM: 8 mg/dL — AB (ref 8.4–10.5)
CO2: 22 meq/L (ref 19–32)
CREATININE: 1.09 mg/dL (ref 0.50–1.10)
Chloride: 100 mEq/L (ref 96–112)
GFR calc Af Amer: 75 mL/min — ABNORMAL LOW (ref >90)
GFR calc non Af Amer: 64 mL/min — ABNORMAL LOW (ref >90)
GLUCOSE: 235 mg/dL — AB (ref 70–99)
Potassium: 4.2 mEq/L (ref 3.7–5.3)
Sodium: 134 mEq/L — ABNORMAL LOW (ref 137–147)

## 2013-09-06 MED ORDER — VANCOMYCIN HCL 10 G IV SOLR
1500.0000 mg | Freq: Two times a day (BID) | INTRAVENOUS | Status: DC
  Administered 2013-09-07 – 2013-09-13 (×12): 1500 mg via INTRAVENOUS
  Filled 2013-09-06 (×15): qty 1500

## 2013-09-06 MED ORDER — SODIUM CHLORIDE 0.9 % IV SOLN
2500.0000 mg | Freq: Once | INTRAVENOUS | Status: AC
  Administered 2013-09-06: 2500 mg via INTRAVENOUS
  Filled 2013-09-06: qty 2500

## 2013-09-06 MED ORDER — DAKINS (1/4 STRENGTH) 0.125 % EX SOLN
Freq: Two times a day (BID) | CUTANEOUS | Status: AC
  Administered 2013-09-06: 1
  Administered 2013-09-07 (×2)
  Administered 2013-09-08: 1
  Administered 2013-09-08: 09:00:00
  Filled 2013-09-06: qty 473

## 2013-09-06 NOTE — Progress Notes (Addendum)
Attempted to move patient to chair. Pt unable to stand with assistance and was lowered when she was on her feet. We were able to hold her up and bring her back up to the bed. Head was not hit. No injuries. Family was at bedside. MD notified. Will continue to monitor.

## 2013-09-06 NOTE — Progress Notes (Signed)
Stroke Team Progress Note  HISTORY [name] is a 38 y.o. female with diabetes mellitus, hypertension, previous stroke and morbid obesity who was admitted on 08/30/2013 for management of left lower extremity abscess complicated by sepsis and hyperglycemia. Abscess was incised and drained on the day of admission. Patient remained intubated postop and was placed on mechanical ventilation. She was subsequently extubated on 08/31/2013. She continued to demonstrate mental status changes and an MRI was obtained on 09/03/2013 which showed multiple small infarcts involving right and left hemispheres, corpus callosum and medulla with a pattern consistent with embolic phenomena. Patient has not demonstrated a change in speech nor focal motor abnormalities. NIH stroke score was 2 for mental status changes.   LSN: Unclear  tPA Given: No: No clear when last known well; no objective focal deficits.  MRankin: 3  SUBJECTIVE No family at bedside, but pt states they are here somewhere. Dr. Earlie Counts discussing her dx and prognosis with her. She states she understands and has no questions. He shared how important it was that she stay for ongoing workup (she wanted to go home last night).   OBJECTIVE Most recent Vital Signs: Filed Vitals:   09/05/13 2040 09/05/13 2205 09/06/13 0544 09/06/13 0914  BP:  150/96 128/61 158/97  Pulse:  101 97 101  Temp:  98.3 F (36.8 C) 98.1 F (36.7 C) 98.1 F (36.7 C)  TempSrc:  Oral Oral Oral  Resp:  $Remo'18 18 20  'ZqRsx$ Height:      Weight:      SpO2: 97% 97% 98% 100%   CBG (last 3)   Recent Labs  09/05/13 1625 09/05/13 2205 09/06/13 0636  GLUCAP 242* 212* 196*    IV Fluid Intake:   . sodium chloride 10 mL/hr at 09/04/13 1734    MEDICATIONS  .  stroke: mapping our early stages of recovery book   Does not apply Once  . antiseptic oral rinse  15 mL Mouth Rinse QID  . arformoterol  15 mcg Nebulization Q12H  . aspirin EC  325 mg Oral Daily  . atorvastatin  20 mg Oral  q1800  . budesonide (PULMICORT) nebulizer solution  0.25 mg Nebulization Q12H  . chlorhexidine  15 mL Mouth Rinse BID  . feeding supplement (PRO-STAT SUGAR FREE 64)  30 mL Oral BID WC  . gemfibrozil  600 mg Oral BID AC  . heparin  5,000 Units Subcutaneous 3 times per day  . insulin aspart  0-20 Units Subcutaneous TID WC  . insulin aspart  6 Units Subcutaneous TID WC  . insulin glargine  22 Units Subcutaneous Daily  . insulin starter kit- syringes  1 kit Other Once  . piperacillin-tazobactam (ZOSYN)  IV  3.375 g Intravenous Q8H  . sodium hypochlorite   Irrigation BID   PRN:  acetaminophen, acetaminophen, albuterol, hydrALAZINE, sodium chloride, sodium chloride  Diet:  Carb Control thin liquids Activity:  Bedrest, up in chair, progress activity DVT Prophylaxis:  SCDs / subcutaneous heparin  CLINICALLY SIGNIFICANT STUDIES Basic Metabolic Panel:  Recent Labs Lab 08/30/13 1429  08/31/13 0220  09/04/13 0442 09/05/13 0549  NA 125*  --  131*  < > 137 138  K 4.6  --  3.8  < > 4.7 3.7  CL 85*  --  94*  < > 103 96  CO2 17*  --  21  < > 15* 23  GLUCOSE 430*  < > 218*  < > 164* 152*  BUN 48*  --  50*  < >  29* 11  CREATININE 1.85*  --  1.88*  < > 1.01 0.75  CALCIUM 8.5  --  7.5*  < > 8.2* 8.8  MG  --   --  2.6*  --   --   --   PHOS  --   --  2.8  --   --   --   < > = values in this interval not displayed. Liver Function Tests:   Recent Labs Lab 08/30/13 1429 08/31/13 0220  AST 36 27  ALT 20 19  ALKPHOS 140* 154*  BILITOT 0.4 0.2*  PROT 8.1 6.9  ALBUMIN 1.7* 1.5*   CBC:  Recent Labs Lab 08/30/13 1429  09/03/13 0500 09/04/13 0442  WBC 24.0*  < > 17.0* 11.6*  NEUTROABS 20.4*  --   --   --   HGB 11.6*  < > 9.7* 11.6*  HCT 33.6*  < > 29.4* 35.9*  MCV 78.3  < > 80.8 83.5  PLT 173  < > 335 350  < > = values in this interval not displayed. Coagulation: No results found for this basename: LABPROT, INR,  in the last 168 hours Cardiac Enzymes: No results found for this  basename: CKTOTAL, CKMB, CKMBINDEX, TROPONINI,  in the last 168 hours Urinalysis:   Recent Labs Lab 09/05/13 1822  COLORURINE YELLOW  LABSPEC 1.021  PHURINE 5.0  GLUCOSEU NEGATIVE  HGBUR LARGE*  BILIRUBINUR NEGATIVE  KETONESUR NEGATIVE  PROTEINUR 100*  UROBILINOGEN 0.2  NITRITE NEGATIVE  LEUKOCYTESUR LARGE*   Lipid Panel    Component Value Date/Time   CHOL 215* 09/05/2013 0549   TRIG 120 09/05/2013 0549   HDL 61 09/05/2013 0549   CHOLHDL 3.5 09/05/2013 0549   VLDL 24 09/05/2013 0549   LDLCALC 130* 09/05/2013 0549   HgbA1C  Lab Results  Component Value Date   HGBA1C 9.0* 09/05/2013    Urine Drug Screen:     Component Value Date/Time   LABOPIA NEG 08/26/2012 1251   COCAINSCRNUR NEG 08/26/2012 1251   LABBENZ NEG 08/26/2012 1251   AMPHETMU NEG 08/26/2012 1251    Alcohol Level: No results found for this basename: ETH,  in the last 168 hours  Mr Brain Wo Contrast 09/03/2013    1. Multiple small infarcts involving both cerebral hemispheres, corpus callosum, and medulla, consistent with embolic infarcts. These could be bland or septic emboli given history.  2. 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     CT Angiogram of the head and neck  09/05/2013 1. Occlusion of the intracranial right ICA. Multiple small collateral vessels in the region of the right ICA terminus is indicative of chronic occlusion. Right MCA is reconstituted new the MCA bifurcation, with slightly diminished distal flow compared to the left. Right ACA is mainly supplied via the anterior communicating artery. Right ICA is diffusely small in the neck, with moderate stenosis at its origin.  2. Moderate left carotid siphon atherosclerosis. Mild to moderate proximal right PCA stenosis. Mild to moderate attenuation of distal left PCA branch vessels.  3. 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  4. Dominant right vertebral artery without stenosis identified.   5. Small, acute bilateral infarcts as described on recent MRI.  Carotid Doppler  Preliminary report: Bilateral: 1-39% ICA stenosis. Vertebral artery flow is antegrade. Right: Proximal ICA waveform is abnormal (spiked with no diastolic component.) Mid and distal ICA waveform is within normal limits.  LE venous doppler There is no obvious evidence  of DVT or SVT noted in the bilateral lower extremities.   2D Echocardiogram    CXR  08/31/2013 - Enteric tube is seen ending at the body of the stomach, with the sideport at the fundus of the stomach.  EKG - ST rate 107 beats per minute.  For complete results please see formal report.   Therapy Recommendations the physical therapist recommends skilled nursing facility placement.  Physical Exam   Cardiac: HRR, tachycardic Neurologic Examination:  Mental Status:  Alert and oriented to person and place. Mild naming difficulties. Speech fluent without evidence of aphasia. Able to follow commands. Able to repeat. Cranial Nerves:  II-Visual fields were normal.  III/IV/VI-Pupils were equal and reacted.  , but Extraocular movements were full with some left exotropia.  V/VII-mild right lower face weakness VIII-normal.  X-normal speech and symmetrical palatal movement.  Motor: RUE drift. 4+/5 bilaterally with normal tone and bulk. Fine motor movements diminished   on the right. Orbits left over right upper extremity Sensory: symmetrical throughout.  Plantars: Flexor bilaterally  Cerebellar: Normal finger-to-nose testing on the left. Mild dysmetria on the right.  ASSESSMENT [name] is a 38 y.o. female presenting with mental status changes. T-PA therapy was not initiated as the time of onset was not known and there were no objective focal deficits. 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  Need to rule out endocarditis in the setting of sepsis and infection. Blood and urine culture negative. She remains on Zosyn. She does have multiple other stroke risk factors including HTN, DM, HLD, morbid obesity. On no antithrombotics prior to admission. Stroke work up underway.  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   Hospital day # 7  TREATMENT/PLAN  Continue aspirin 325 mg orally every day for secondary stroke prevention. TEE to look for embolic source. Arranged with [facility] [place] for tomorrow.  Confirmed with Trish. (I have made patient NPO after midnight tonight).  Risk factor modification  - Needs diabetic control.  F/u  2-D echo   Dispo: skilled nursing facility placement.   Burnetta Sabin, MSN, RN, ANVP-BC, ANP-BC, [facility] Pager: [phone] 09/06/2013 9:28 AM  SIGNED I have personally examined this patient, reviewed notes, independently viewed imaging studies, participated in medical decision making and plan of care. I have made any additions or clarifications directly to the above note. Agree with note above.    Antony Contras, MD Medical Director [facility] Pager: [phone] 09/06/2013 4:31 PM   To contact Stroke Continuity provider, please refer to [email or web address] After hours, contact General Neurology

## 2013-09-06 NOTE — Progress Notes (Signed)
[facility] Progress Note 7 Days Post-Op  Subjective: Patient is no distress  Objective: Vital signs in last 24 hours: Temp:  [98.1 F (36.7 C)-98.4 F (36.9 C)] 98.1 F (36.7 C) (07/27 0544) Pulse Rate:  [97-108] 97 (07/27 0544) Resp:  [18] 18 (07/27 0544) BP: (128-185)/(61-102) 128/61 mmHg (07/27 0544) SpO2:  [95 %-100 %] 98 % (07/27 0544) Last BM Date: 09/05/13  Intake/Output from previous day: 07/26 0701 - 07/27 0700 In: 960 [P.O.:960] Out: 802 [Urine:800; Stool:2] Intake/Output this shift:    General: No distress  Lungs: Clear.  Abd: Large left groin wound with the smell of Pseudomonas.  Some granualtion, nothng needs acute debridement.  Extremities: Large groin wound.  Neuro: Intact  Lab Results:  @LABLAST2 (wbc:2,hgb:2,hct:2,plt:2) BMET  Recent Labs  09/04/13 0442 09/05/13 0549  NA 137 138  K 4.7 3.7  CL 103 96  CO2 15* 23  GLUCOSE 164* 152*  BUN 29* 11  CREATININE 1.01 0.75  CALCIUM 8.2* 8.8   PT/INR No results found for this basename: LABPROT, INR,  in the last 72 hours ABG No results found for this basename: PHART, PCO2, PO2, HCO3,  in the last 72 hours  Studies/Results: Ct Angio Head W/cm &/or Wo Cm  09/05/2013   CLINICAL DATA:  Diabetes, hypertension, and prior stroke with left lower extremity abscess and sepsis. Multiple, likely embolic infarcts on MRI. Stroke workup.  EXAM: CT ANGIOGRAPHY HEAD AND NECK  TECHNIQUE: Multidetector CT imaging of the head and neck was performed using the standard protocol during bolus administration of intravenous contrast. Multiplanar CT image reconstructions and MIPs were obtained to evaluate the vascular anatomy. Carotid stenosis measurements (when applicable) are obtained utilizing NASCET criteria, using the distal internal carotid diameter as the denominator.  CONTRAST:  50mL OMNIPAQUE IOHEXOL 350 MG/ML SOLN  COMPARISON:  Brain MRI 09/03/2013  FINDINGS: CTA HEAD FINDINGS  Small foci of hypoattenuation are seen in  the anterior body and genu of the corpus callosum, right globus pallidus, and left posterior left temporal periventricular white matter corresponding to acute infarcts described on recent MRI additional, punctate acute infarcts on the MRI are not well demonstrated on the CT. Remote infarcts are noted in both cerebellar hemispheres and in the left basal ganglia/ corona radiata. There is no evidence of acute large territory cortical infarct, intracranial hemorrhage, midline shift, or extra-axial fluid collection. There is mild ex vacuo dilatation of the body of the left lateral ventricle. No abnormal enhancement is identified. Orbits are unremarkable. Mastoid air cells and paranasal sinuses are clear.  The intracranial right vertebral artery is patent, dominant, and supplies the basilar. The left intracranial vertebral artery is markedly small in caliber and only intermittently patent. PICA origins appear patent. AICA and SCA origins are patent. Basilar artery is mildly irregular but without stenosis. There is a focal, mild-to-moderate distal right P1 stenosis. There is irregular, mild to moderate attenuation of left P2 and P3 branches. Posterior communicating arteries are not clearly identified.  The proximal right intracranial ICA is diffusely small in caliber with a severe stenosis at the petrous-cavernous junction. The right ICA becomes occluded at the anterior cavernous segment. There is minimal to no reconstitution of the right intracranial ICA distal to this. Multiple collateral vessels are seen in the expected region of the right ICA terminus. These collateral vessels reconstitute the right MCA near the MCA bifurcation. Major right M2 branches appear patent although mildly smaller in caliber than those on the left. A small amount of contrast is present in  the right A1 segment, with the A2 being supplied predominantly by the left ACA via the anterior communicating artery, which appears somewhat bulbous.  The  left internal carotid artery is patent from skullbase to carotid terminus. There is mild calcification and moderate irregularity of the left carotid siphon. There is mild narrowing of the left ICA at the petrous-cavernous junction. There is diffuse irregularity with mild to moderate tandem stenoses involving the anterior genu of the left cavernous carotid as well as the supraclinoid left ICA. There is mild stenosis of the proximal left A1 segment. There is also mild proximal left M1 stenosis. There is mild, diffuse irregularity of left M2 and more distal branches.  Review of the MIP images confirms the above findings.  CTA NECK FINDINGS  Three vessel aortic arch. Subclavian arteries are grossly unremarkable. Evaluation of the proximal common carotid arteries is limited by image noise due to shoulder attenuation as well as mild motion artifact. No mid or distal common carotid artery stenosis is seen. There is moderate focal narrowing of the right internal carotid artery at its origin. The right ICA obtains a more normal caliber for approximately 1 cm beyond the stenosis before becoming narrowed again and demonstrating a diffusely narrowed but smooth luminal contour for the remainder of its course in the neck. The right ICA lumen near its origin at the site of proximal stenosis measures approximately 2.3 mm in diameter, with the diffusely narrowed mid to distal right cervical ICA measuring approximately 2.7 mm in diameter. The left cervical ICA is mildly tortuous but patent and without stenosis.  The proximal right vertebral artery is grossly patent but suboptimally evaluated due to artifact. The V2 and V3 segments of the right vertebral artery are normal in appearance. The left vertebral artery appears occluded at its origin with reconstitution at the C4 level, with the lumen of the vertebral artery being markedly small in caliber distal to this.  A left subclavian approach central venous catheter is partially  visualized. Jugulodigastric lymph nodes measure up to 10 mm in short axis.  Review of the MIP images confirms the above findings.  IMPRESSION: 1. Occlusion of the intracranial right ICA. Multiple small collateral vessels in the region of the right ICA terminus is indicative of chronic occlusion. Right MCA is reconstituted new the MCA bifurcation, with slightly diminished distal flow compared to the left. Right ACA is mainly supplied via the anterior communicating artery. Right ICA is diffusely small in the neck, with moderate stenosis at its origin. 2. Moderate left carotid siphon atherosclerosis. Mild to moderate proximal right PCA stenosis. Mild to moderate attenuation of distal left PCA branch vessels. 3. 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 4. Dominant right vertebral artery without stenosis identified. 5. Small, acute bilateral infarcts as described on recent MRI.   Electronically Signed   By: Sebastian Ache   On: 09/05/2013 20:48   Ct Angio Neck W/cm &/or Wo/cm  09/05/2013   CLINICAL DATA:  Diabetes, hypertension, and prior stroke with left lower extremity abscess and sepsis. Multiple, likely embolic infarcts on MRI. Stroke workup.  EXAM: CT ANGIOGRAPHY HEAD AND NECK  TECHNIQUE: Multidetector CT imaging of the head and neck was performed using the standard protocol during bolus administration of intravenous contrast. Multiplanar CT image reconstructions and MIPs were obtained to evaluate the vascular anatomy. Carotid stenosis measurements (when applicable) are obtained utilizing NASCET criteria, using the distal internal carotid diameter as the denominator.  CONTRAST:  50mL OMNIPAQUE IOHEXOL 350 MG/ML SOLN  COMPARISON:  Brain MRI 09/03/2013  FINDINGS: CTA HEAD FINDINGS  Small foci of hypoattenuation are seen in the anterior body and genu of the corpus callosum, right globus pallidus, and left posterior left temporal periventricular white matter  corresponding to acute infarcts described on recent MRI additional, punctate acute infarcts on the MRI are not well demonstrated on the CT. Remote infarcts are noted in both cerebellar hemispheres and in the left basal ganglia/ corona radiata. There is no evidence of acute large territory cortical infarct, intracranial hemorrhage, midline shift, or extra-axial fluid collection. There is mild ex vacuo dilatation of the body of the left lateral ventricle. No abnormal enhancement is identified. Orbits are unremarkable. Mastoid air cells and paranasal sinuses are clear.  The intracranial right vertebral artery is patent, dominant, and supplies the basilar. The left intracranial vertebral artery is markedly small in caliber and only intermittently patent. PICA origins appear patent. AICA and SCA origins are patent. Basilar artery is mildly irregular but without stenosis. There is a focal, mild-to-moderate distal right P1 stenosis. There is irregular, mild to moderate attenuation of left P2 and P3 branches. Posterior communicating arteries are not clearly identified.  The proximal right intracranial ICA is diffusely small in caliber with a severe stenosis at the petrous-cavernous junction. The right ICA becomes occluded at the anterior cavernous segment. There is minimal to no reconstitution of the right intracranial ICA distal to this. Multiple collateral vessels are seen in the expected region of the right ICA terminus. These collateral vessels reconstitute the right MCA near the MCA bifurcation. Major right M2 branches appear patent although mildly smaller in caliber than those on the left. A small amount of contrast is present in the right A1 segment, with the A2 being supplied predominantly by the left ACA via the anterior communicating artery, which appears somewhat bulbous.  The left internal carotid artery is patent from skullbase to carotid terminus. There is mild calcification and moderate irregularity of the  left carotid siphon. There is mild narrowing of the left ICA at the petrous-cavernous junction. There is diffuse irregularity with mild to moderate tandem stenoses involving the anterior genu of the left cavernous carotid as well as the supraclinoid left ICA. There is mild stenosis of the proximal left A1 segment. There is also mild proximal left M1 stenosis. There is mild, diffuse irregularity of left M2 and more distal branches.  Review of the MIP images confirms the above findings.  CTA NECK FINDINGS  Three vessel aortic arch. Subclavian arteries are grossly unremarkable. Evaluation of the proximal common carotid arteries is limited by image noise due to shoulder attenuation as well as mild motion artifact. No mid or distal common carotid artery stenosis is seen. There is moderate focal narrowing of the right internal carotid artery at its origin. The right ICA obtains a more normal caliber for approximately 1 cm beyond the stenosis before becoming narrowed again and demonstrating a diffusely narrowed but smooth luminal contour for the remainder of its course in the neck. The right ICA lumen near its origin at the site of proximal stenosis measures approximately 2.3 mm in diameter, with the diffusely narrowed mid to distal right cervical ICA measuring approximately 2.7 mm in diameter. The left cervical ICA is mildly tortuous but patent and without stenosis.  The proximal right vertebral artery is grossly patent but suboptimally evaluated due to artifact. The V2 and V3 segments of the right vertebral artery are normal in appearance. The left  vertebral artery appears occluded at its origin with reconstitution at the C4 level, with the lumen of the vertebral artery being markedly small in caliber distal to this.  A left subclavian approach central venous catheter is partially visualized. Jugulodigastric lymph nodes measure up to 10 mm in short axis.  Review of the MIP images confirms the above findings.  IMPRESSION:  1. Occlusion of the intracranial right ICA. Multiple small collateral vessels in the region of the right ICA terminus is indicative of chronic occlusion. Right MCA is reconstituted new the MCA bifurcation, with slightly diminished distal flow compared to the left. Right ACA is mainly supplied via the anterior communicating artery. Right ICA is diffusely small in the neck, with moderate stenosis at its origin. 2. Moderate left carotid siphon atherosclerosis. Mild to moderate proximal right PCA stenosis. Mild to moderate attenuation of distal left PCA branch vessels. 3. 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 4. Dominant right vertebral artery without stenosis identified. 5. Small, acute bilateral infarcts as described on recent MRI.   Electronically Signed   By: Sebastian AcheAllen  Grady   On: 09/05/2013 20:48   Dg Chest Port 1 View  09/05/2013   CLINICAL DATA:  Left-sided PICC placement.  EXAM: PORTABLE CHEST - 1 VIEW  COMPARISON:  Chest x-ray 08/31/2013.  FINDINGS: There is a left upper extremity PICC with tip terminating in the superior aspect of the right atrium (approximately 3 cm beyond the superior cavoatrial junction). No pneumothorax. No acute consolidative airspace disease. No pleural effusions. No evidence of pulmonary edema. Mild cardiomegaly. Upper mediastinal contours are within normal limits.  IMPRESSION: 1. Tip of PICC is in the right atrium approximately 3 cm distal to the superior cavoatrial junction. 2. Status post extubation with significantly improved aeration. 3. Mild cardiomegaly.   Electronically Signed   By: Trudie Reedaniel  Entrikin M.D.   On: 09/05/2013 14:21    Anti-infectives: Anti-infectives   Start     Dose/Rate Route Frequency Ordered Stop   09/02/13 0800  vancomycin (VANCOCIN) 1,250 mg in sodium chloride 0.9 % 250 mL IVPB  Status:  Discontinued     1,250 mg 166.7 mL/hr over 90 Minutes Intravenous Every 12 hours 09/01/13 1252 09/03/13  1007   09/01/13 1100  vancomycin (VANCOCIN) 1,750 mg in sodium chloride 0.9 % 500 mL IVPB  Status:  Discontinued     1,750 mg 250 mL/hr over 120 Minutes Intravenous Every 24 hours 08/31/13 1418 09/01/13 1252   08/31/13 2000  piperacillin-tazobactam (ZOSYN) IVPB 3.375 g     3.375 g 12.5 mL/hr over 240 Minutes Intravenous Every 8 hours 08/31/13 1026     08/31/13 1200  vancomycin (VANCOCIN) 1,750 mg in sodium chloride 0.9 % 500 mL IVPB     1,750 mg 250 mL/hr over 120 Minutes Intravenous  Once 08/31/13 1036 08/31/13 1303   08/31/13 1100  piperacillin-tazobactam (ZOSYN) IVPB 3.375 g     3.375 g 12.5 mL/hr over 240 Minutes Intravenous  Once 08/31/13 1026 08/31/13 1504   08/30/13 1515  [MAR Hold]  vancomycin (VANCOCIN) IVPB 1000 mg/200 mL premix     (On MAR Hold since 08/30/13 1616)   1,000 mg 200 mL/hr over 60 Minutes Intravenous  Once 08/30/13 1510 08/30/13 1647   08/30/13 1515  piperacillin-tazobactam (ZOSYN) IVPB 3.375 g     3.375 g 100 mL/hr over 30 Minutes Intravenous  Once 08/30/13 1510 08/30/13 1601      Assessment/Plan: s/p Procedure(s): IRRIGATION AND DEBRIDEMENT ABSCESS Left  Thigh Necrotic Wound May benefit from Dakin's solution.  Several 4x4 gauze in wound, need to use KErlix rolls  LOS: 7 days   Marta Lamas. Gae Bon, MD, FACS [phone] [facility] Surgery 09/06/2013

## 2013-09-06 NOTE — Evaluation (Signed)
Speech Language Pathology Evaluation Patient Details Name: [name] MRN: [id number] DOB: 06-19-1975 Today's Date: 09/06/2013 Time: 4098-1191 SLP Time Calculation (min): 18 min  Problem List:  Patient Active Problem List   Diagnosis Date Noted  . Cerebral embolism with cerebral infarction 09/04/2013  . Thigh abscess 08/30/2013  . Sepsis 08/30/2013  . Diabetes mellitus with hyperglycemia 08/30/2013  . AKI (acute kidney injury) 08/30/2013  . Acute respiratory failure with hypoxia 08/30/2013   Past Medical History:  Past Medical History  Diagnosis Date  . Morbid obesity   . Hypertension   . CVA (cerebral infarction) 1990's    "writing is not the same since"  . Asthma   . Type II diabetes mellitus   . GERD (gastroesophageal reflux disease)    Past Surgical History:  Past Surgical History  Procedure Laterality Date  . Incision and drainage of wound Left 08/30/2013    thigh necrotic wound/notes 08/30/2013  . Irrigation and debridement abscess Left 08/30/2013    Procedure: IRRIGATION AND DEBRIDEMENT ABSCESS Left Thigh Necrotic Wound;  Surgeon: [name], MD;  Location: [facility] OR;  Service: General;  Laterality: Left;   HPI:  [name] is a 38 y.o. female with diabetes mellitus, hypertension, previous stroke and morbid obesity who was admitted on 08/30/2013 for management of left lower extremity abscess complicated by sepsis and hyperglycemia. Abscess was incised and drained on the day of admission. Patient remained intubated postop and was placed on mechanical ventilation. She was subsequently extubated on 08/31/2013. She continued to demonstrate mental status changes and an MRI was obtained on 09/03/2013 which showed multiple small infarcts involving right and left hemispheres, corpus callosum and medulla with a pattern consistent with embolic phenomena. Patient has not demonstrated a change in speech nor focal motor abnormalities. NIH stroke score was 2 for mental status  changes.  Orders received for cognitive-linguistic evaluation.     Assessment / Plan / Recommendation Clinical Impression  Patient demonstrates moderate cognitive deficits characterized by impaired sustained attention, recall of new/short term information, awareness of current deficits and ability to problem solve.  This impact's her ability to make safe decision related to her self-care and as a result, skilled SLP services are recommended during her acute care stay.  As well as, 24/7 supervision upon discharge which significant other reports he can provide and follow-up services following discharge.    SLP provided education regarding current deficits and recommended strategies to utilize to increase attention to basic tasks: quiet environment, repetition, written cues, increased wait time and questioning to assist her with recall.  SLP also encouraged family to engage patient in tasks or games during the day to assist with keeping her awake and alert.    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    Cognition  Overall Cognitive Status: Impaired/Different from baseline Arousal/Alertness: Lethargic Orientation Level: Oriented to person;Oriented to place;Disoriented to time;Disoriented to situation Attention: Sustained Sustained Attention: Impaired Sustained Attention Impairment: Verbal basic;Functional basic Memory: Impaired Memory Impairment: Storage  deficit;Retrieval deficit;Decreased recall of new information;Decreased short term memory Decreased  Short Term Memory: Verbal basic;Functional basic Awareness: Impaired Awareness Impairment: Intellectual impairment Problem Solving: Impaired Problem Solving Impairment: Verbal basic;Functional basic Executive Function: Initiating;Self Monitoring;Self Correcting Initiating: Impaired Initiating Impairment: Verbal basic;Functional basic Self Monitoring: Impaired Self Monitoring Impairment: Verbal basic;Functional basic Self Correcting: Impaired Self Correcting Impairment: Verbal basic;Functional basic Safety/Judgment: Impaired    Comprehension  Auditory Comprehension Overall Auditory Comprehension:  (attention and working [employer]memeory impact comp) IT trainerVisual Recognition/Discrimination Discrimination: Exceptions to [facility] CenterWFL (during reading task) Reading Comprehension Reading Status: Impaired Sentence Level: Within functional limits Functional Environmental (signs, name badge): Impaired Interfering Components: Attention Effective Techniques: Large print    Expression Expression Primary Mode of Expression: Verbal Verbal Expression Overall Verbal Expression:  (impacted by cognition ) Written Expression Written Expression: Not tested   Oral / Motor Oral Motor/Sensory Function Overall Oral Motor/Sensory Function: Appears within functional limits for tasks assessed Motor Speech Overall Motor Speech: Appears within functional limits for tasks assessed   GO    Charlane FerrettiMelissa Joss Mcdill, M.A., CCC-SLP [phone]  [name] 09/06/2013, 1:24 PM

## 2013-09-06 NOTE — Progress Notes (Signed)
ANTIBIOTIC CONSULT NOTE - INITIAL  Pharmacy Consult for Vancomycin/Zosyn Indication: Wound infection , LLE abscess   Allergies  Allergen Reactions  . Azithromycin Other (See Comments)    Nose bleeding event    Patient Measurements: Height: 5\' 1"  (154.9 cm) Weight: 319 lb 3.6 oz (144.8 kg) IBW/kg (Calculated) : 47.8 Adjusted Body Weight:    Vital Signs: Temp: 98.1 F (36.7 C) (07/27 0914) Temp src: Oral (07/27 0914) BP: 158/97 mmHg (07/27 0914) Pulse Rate: 101 (07/27 0914) Intake/Output from previous day: 07/26 0701 - 07/27 0700 In: 960 [P.O.:960] Out: 802 [Urine:800; Stool:2] Intake/Output from this shift: Total I/O In: 10 [I.V.:10] Out: -   Labs:  Recent Labs  09/04/13 0442 09/05/13 0549 09/06/13 0930  WBC 11.6*  --  14.6*  HGB 11.6*  --  9.4*  PLT 350  --  406*  CREATININE 1.01 0.75 1.09   Estimated Creatinine Clearance: 97.5 ml/min (by C-G formula based on Cr of 1.09). No results found for this basename: VANCOTROUGH, VANCOPEAK, VANCORANDOM, GENTTROUGH, GENTPEAK, GENTRANDOM, TOBRATROUGH, TOBRAPEAK, TOBRARND, AMIKACINPEAK, AMIKACINTROU, AMIKACIN,  in the last 72 hours   Microbiology: Recent Results (from the past 720 hour(s))  CULTURE, BLOOD (ROUTINE X 2)     Status: None   Collection Time    08/30/13  2:29 PM      Result Value Ref Range Status   Specimen Description BLOOD LEFT ANTECUBITAL   Final   Special Requests     Final   Value: BOTTLES DRAWN AEROBIC AND ANAEROBIC 6CC AER 5CC ANA   Culture  Setup Time     Final   Value: 08/30/2013 19:36     Performed at [facility]   Culture     Final   Value: NO GROWTH 5 DAYS     Performed at [facility]   Report Status 09/05/2013 FINAL   Final  CULTURE, BLOOD (ROUTINE X 2)     Status: None   Collection Time    08/30/13  2:41 PM      Result Value Ref Range Status   Specimen Description BLOOD LEFT HAND   Final   Special Requests BOTTLES DRAWN AEROBIC AND ANAEROBIC [facility]   Final   Culture   Setup Time     Final   Value: 08/30/2013 19:36     Performed at [facility]   Culture     Final   Value: STAPHYLOCOCCUS SPECIES (COAGULASE NEGATIVE)     Note: THE SIGNIFICANCE OF ISOLATING THIS ORGANISM FROM A SINGLE SET OF BLOOD CULTURES WHEN MULTIPLE SETS ARE DRAWN IS UNCERTAIN. PLEASE NOTIFY THE MICROBIOLOGY DEPARTMENT WITHIN ONE WEEK IF SPECIATION AND SENSITIVITIES ARE REQUIRED.     Note: Gram Stain Report Called to,Read Back By and Verified With: SARA G@12 :54PM ON 08/31/13 BY DANTS     Performed at [facility]   Report Status 09/01/2013 FINAL   Final  WOUND CULTURE     Status: None   Collection Time    08/30/13  5:06 PM      Result Value Ref Range Status   Specimen Description WOUND THIGH LEFT   Final   Special Requests PATIENT ON FOLLOWING VANCOMYCIN, ZOSYN   Final   Gram Stain     Final   Value: RARE WBC PRESENT, PREDOMINANTLY MONONUCLEAR     NO SQUAMOUS EPITHELIAL CELLS SEEN     ABUNDANT GRAM NEGATIVE RODS     FEW GRAM POSITIVE COCCI IN PAIRS     IN CLUSTERS FEW  GRAM POSITIVE RODS   Culture     Final   Value: NO GROWTH 2 DAYS     Performed at [facility]   Report Status 09/02/2013 FINAL   Final  ANAEROBIC CULTURE     Status: None   Collection Time    08/30/13  5:06 PM      Result Value Ref Range Status   Specimen Description WOUND THIGH LEFT   Final   Special Requests PATIENT ON FOLLOWING VANCOMYCIN, ZOSYN   Final   Gram Stain     Final   Value: RARE WBC PRESENT, PREDOMINANTLY MONONUCLEAR     NO SQUAMOUS EPITHELIAL CELLS SEEN     ABUNDANT GRAM NEGATIVE RODS     FEW GRAM POSITIVE COCCI IN PAIRS     IN CLUSTERS FEW GRAM POSITIVE RODS   Culture     Final   Value: NO ANAEROBES ISOLATED; CULTURE IN PROGRESS FOR 5 DAYS     Performed at [facility]   Report Status PENDING   Incomplete  MRSA PCR SCREENING     Status: None   Collection Time    08/30/13  7:11 PM      Result Value Ref Range Status   MRSA by PCR NEGATIVE  NEGATIVE Final    Comment:            The GeneXpert MRSA Assay (FDA     approved for NASAL specimens     only), is one component of a     comprehensive MRSA colonization     surveillance program. It is not     intended to diagnose MRSA     infection nor to guide or     monitor treatment for     MRSA infections.  URINE CULTURE     Status: None   Collection Time    08/30/13  7:12 PM      Result Value Ref Range Status   Specimen Description URINE, CATHETERIZED   Final   Special Requests NONE   Final   Culture  Setup Time     Final   Value: 08/31/2013 00:49     Performed at [facility] Count     Final   Value: NO GROWTH     Performed at [facility]   Culture     Final   Value: NO GROWTH     Performed at [facility]   Report Status 09/01/2013 FINAL   Final  CULTURE, BLOOD (ROUTINE X 2)     Status: None   Collection Time    09/01/13 11:10 AM      Result Value Ref Range Status   Specimen Description BLOOD LEFT ANTECUBITAL   Final   Special Requests BOTTLES DRAWN AEROBIC ONLY [facility]   Final   Culture  Setup Time     Final   Value: 09/01/2013 14:01     Performed at [facility]   Culture     Final   Value:        BLOOD CULTURE RECEIVED NO GROWTH TO DATE CULTURE WILL BE HELD FOR 5 DAYS BEFORE ISSUING A FINAL NEGATIVE REPORT     Performed at [facility]   Report Status PENDING   Incomplete  CULTURE, BLOOD (ROUTINE X 2)     Status: None   Collection Time    09/01/13 11:20 AM      Result Value Ref Range Status   Specimen Description BLOOD  LEFT HAND   Final   Special Requests BOTTLES DRAWN AEROBIC ONLY [facility]   Final   Culture  Setup Time     Final   Value: 09/01/2013 14:01     [facility]   Culture     Final   Value:        BLOOD CULTURE RECEIVED NO GROWTH TO DATE CULTURE WILL BE HELD FOR 5 DAYS BEFORE ISSUING A FINAL NEGATIVE REPORT     [facility]   Report Status PENDING   Incomplete    Medical  History: Past Medical History  Diagnosis Date  . Morbid obesity   . Hypertension   . CVA (cerebral infarction) 1990's    "writing is not the same since"  . Asthma   . Type II diabetes mellitus   . GERD (gastroesophageal reflux disease)     Assessment: 37 YOF taken to the OR for I&D of L-leg abscess. 08/30/2013  Anticoagulation: SQ heparin for VTE prophx. Hgb 11.6>>9.4. Plts 406 ok.  Infectious Disease: Zosyn day #4, adding Vanco, for r/o sepsis/abscess thought to be d/t wound/abscess, s/p I&D on 7/20; Afeb, WBC 14.6 back up. No definitive culture growth. On Dakin's Soln. Large left groin wound with the smell of Pseudomonas  Vanc 7/20>> 7/24, resume 7/27 Zosyn 7/20 >>  7/20 WCx >> GNR, few GPC, final 7/20 BCx >> 1/2 CoNS (deemed contaminant) 7/20 UCx >> NG 7/22 BCx>>NGTD  Cardiovascular: Hx HTN. TG 550>>now 120?Marland Kitchen. BP 158/97, HR 97-108. Plan TEE to eval for embolic source of infarcts. Meds: [id number], Lipitor20, Lopid  Endocrinology: Hx DM/obesity (morbid) admitted with DKA. CBGs 162-242 on Lantus/SSI  Gastrointestinal / Nutrition: Morbid obesity, LFTs wnl.   Neurology: Hx CVA, MRI with multiple embolic infarcts  Nephrology: AKI - resolving. SCr now 1.09  Pulmonary: s/p VDRF (7/20 >> 7/21) - now RA.  Hematology / Oncology: Hg 11.6>>9.4  PTA Medication Issues: Alb inhaler, Qvar, metformin, trimaterene, HCTZ  Best Practices: MC, Hep SQ   Goal of Therapy:  Vancomycin trough level 15-20 mcg/ml  Plan:  Cont Zosyn 3.375g IV every 8 hours Vanco 2500mg  IV x 1 load, then 1500mg  IV q12h. Vanco trough after 3-5 doses at steady state   Marytza Grandpre S. Merilynn Finlandobertson, PharmD, BCPS Clinical Staff Pharmacist Pager [phone]  Misty Stanleyobertson, Jazarah Capili Stillinger 09/06/2013,11:30 AM

## 2013-09-06 NOTE — Progress Notes (Signed)
[facility] PROGRESS NOTE  [name] YDX:[id number] DOB: Apr 04, 1975 DOA: 08/30/2013 PCP: Barbette Merino, MD  Assessment/Plan: Multiple B embolic infarcts  - Seen on MRI - CTA head and neck pending - Neurology consulted and following, appreciate recs - 2d echo ordered and is pending - B carotid doppler with abnormal finding? Await CTA neck - On rectal ASA for now - Consulted Cardiology for TEE, planned for tomorrow to eval for cardioemblic source - PT/OT/SLP - Will broaden coverage with addition of vanc as septic embolism is considered Left thigh wound s/p surgical drainage and debridement of abscess  - Second set of blood cultures, NGTD  - Urine culture negative.  - Wound culture shows no growth. Gram stain shows abundant gm neg rods and moderate GPC in pairs.  - Continue zosyn with addition of empiric vanc - Discontinued hydrotherapy per [facility]  Sepsis.  Resolved; patient no longer meets SIRS criteria  Acute encephalopathy secondary to Sepsis vs CVA Stable, w/o significant change Acute respiratory failure secondary to sepsis with history of asthma.  Resolved; previously required intubation during this hospitalization.  Supplement 2L oxygen nasal cannula prn; SpO2 96%  Continue pulmicort, brovana with prn albuterol as needed  Acute kidney injury  Due to sepsis and acute infection.  Much improved Hyponatremia.  Now resolved with IVF.  Leukocytosis.  Continuing to trend down Microcytic Anemia  Baseline appears to be 13 - 14.  Hemoglobin and Hematocrit both trending up  Anemia panel pending  Diabetes Mellitus type II with hyperglycemia.  Last A1C 15.4  CBGs persistently high; this morning's reading (7/24) at 213. Continue to follow routinely.  Started ssi resistant, meal coverage and lantus qhs. Will monitor and titrate as needed  Patient to receive insulin education prior to d/c.  Nutrition consult  High triglycerides  Serum triglycerides 550.  Started gemfibrazil   Disposition:  Remain inpatient  DVT Prophylaxis:  Prophylactic Heparin with SCDs  Code Status: Full Family Communication: Pt in room Disposition Plan: Pending  Consultants:  Neurology  Surgery  Procedures:    Antibiotics:  Vanc 7/20>>>7/24>>>7/27(resumed)>>>  Zosyn 7/20>>>  HPI/Subjective: Pt without acute events overnight.  Objective: Filed Vitals:   09/05/13 2205 09/06/13 0544 09/06/13 0914 09/06/13 1455  BP: 150/96 128/61 158/97 119/92  Pulse: 101 97 101 80  Temp: 98.3 F (36.8 C) 98.1 F (36.7 C) 98.1 F (36.7 C) 99.3 F (37.4 C)  TempSrc: Oral Oral Oral Oral  Resp: _0 Height:      Weight:      SpO2: 97% 98% 100% 95%    Intake/Output Summary (Last 24 hours) at 09/06/13 1540 Last data filed at 09/06/13 0934  Gross per 24 hour  Intake    370 ml  Output      0 ml  Net    370 ml   Filed Weights   08/30/13 2016 09/01/13 2134 09/02/13 0635  Weight: 135.5 kg (298 lb 11.6 oz) 144.8 kg (319 lb 3.6 oz) 144.8 kg (319 lb 3.6 oz)    Exam:   General:  Arousable, in nad, follows command  Cardiovascular: regular, s1, s2  Respiratory: normal resp effort, no wheezing  Abdomen: soft,nondistended  Musculoskeletal: perfused, no clubbing   Data Reviewed: Basic Metabolic Panel:  Recent Labs Lab 08/31/13 0220  09/02/13 0530 09/03/13 0500 09/04/13 0442 09/05/13 0549 09/06/13 0930  NA 131*  < > 136* 140 137 138 134*  K 3.8  < > 4.0 4.2 4.7 3.7 4.2  CL 94*  < >  101 106 103 96 100  CO2 21  < > 22 21 15* 23 22  GLUCOSE 218*  < > 208* 171* 164* 152* 235*  BUN 50*  < > 38* 35* 29* 11 23  CREATININE 1.88*  < > 1.15* 1.10 1.01 0.75 1.09  CALCIUM 7.5*  < > 8.0* 7.9* 8.2* 8.8 8.0*  MG 2.6*  --   --   --   --   --   --   PHOS 2.8  --   --   --   --   --   --   < > = values in this interval not displayed. Liver Function Tests:  Recent Labs Lab 08/31/13 0220  AST 27  ALT 19  ALKPHOS 154*  BILITOT 0.2*  PROT 6.9  ALBUMIN 1.5*   No  results found for this basename: LIPASE, AMYLASE,  in the last 168 hours No results found for this basename: AMMONIA,  in the last 168 hours CBC:  Recent Labs Lab 09/01/13 0925 09/02/13 0530 09/03/13 0500 09/04/13 0442 09/06/13 0930  WBC 24.0* 18.1* 17.0* 11.6* 14.6*  HGB 10.1* 9.3* 9.7* 11.6* 9.4*  HCT 30.2* 28.1* 29.4* 35.9* 28.5*  MCV 80.7 78.7 80.8 83.5 81.7  PLT 237 270 335 350 406*   Cardiac Enzymes: No results found for this basename: CKTOTAL, CKMB, CKMBINDEX, TROPONINI,  in the last 168 hours BNP (last 3 results) No results found for this basename: PROBNP,  in the last 8760 hours CBG:  Recent Labs Lab 09/05/13 1127 09/05/13 1625 09/05/13 2205 09/06/13 0636 09/06/13 1137  GLUCAP 192* 242* 212* 196* 245*    Recent Results (from the past 240 hour(s))  CULTURE, BLOOD (ROUTINE X 2)     Status: None   Collection Time    08/30/13  2:29 PM      Result Value Ref Range Status   Specimen Description BLOOD LEFT ANTECUBITAL   Final   Special Requests     Final   Value: BOTTLES DRAWN AEROBIC AND ANAEROBIC 6CC AER 5CC ANA   Culture  Setup Time     Final   Value: 08/30/2013 19:36     Performed at [facility]   Culture     Final   Value: NO GROWTH 5 DAYS     Performed at [facility]   Report Status 09/05/2013 FINAL   Final  CULTURE, BLOOD (ROUTINE X 2)     Status: None   Collection Time    08/30/13  2:41 PM      Result Value Ref Range Status   Specimen Description BLOOD LEFT HAND   Final   Special Requests BOTTLES DRAWN AEROBIC AND ANAEROBIC [facility]   Final   Culture  Setup Time     Final   Value: 08/30/2013 19:36     Performed at [facility]   Culture     Final   Value: STAPHYLOCOCCUS SPECIES (COAGULASE NEGATIVE)     Note: THE SIGNIFICANCE OF ISOLATING THIS ORGANISM FROM A SINGLE SET OF BLOOD CULTURES WHEN MULTIPLE SETS ARE DRAWN IS UNCERTAIN. PLEASE NOTIFY THE MICROBIOLOGY DEPARTMENT WITHIN ONE WEEK IF SPECIATION AND SENSITIVITIES ARE  REQUIRED.     Note: Gram Stain Report Called to,Read Back By and Verified With: SARA [id number] :54PM ON 08/31/13 BY DANTS     Performed at [facility]   Report Status 09/01/2013 FINAL   Final  WOUND CULTURE     Status: None   Collection Time  08/30/13  5:06 PM      Result Value Ref Range Status   Specimen Description WOUND THIGH LEFT   Final   Special Requests PATIENT ON FOLLOWING VANCOMYCIN, ZOSYN   Final   Gram Stain     Final   Value: RARE WBC PRESENT, PREDOMINANTLY MONONUCLEAR     NO SQUAMOUS EPITHELIAL CELLS SEEN     ABUNDANT GRAM NEGATIVE RODS     FEW GRAM POSITIVE COCCI IN PAIRS     IN CLUSTERS FEW GRAM POSITIVE RODS   Culture     Final   Value: NO GROWTH 2 DAYS     Performed at [facility]   Report Status 09/02/2013 FINAL   Final  ANAEROBIC CULTURE     Status: None   Collection Time    08/30/13  5:06 PM      Result Value Ref Range Status   Specimen Description WOUND THIGH LEFT   Final   Special Requests PATIENT ON FOLLOWING VANCOMYCIN, ZOSYN   Final   Gram Stain     Final   Value: RARE WBC PRESENT, PREDOMINANTLY MONONUCLEAR     NO SQUAMOUS EPITHELIAL CELLS SEEN     ABUNDANT GRAM NEGATIVE RODS     FEW GRAM POSITIVE COCCI IN PAIRS     IN CLUSTERS FEW GRAM POSITIVE RODS   Culture     Final   Value: ABUNDANT BACTEROIDES UNIFORMIS     Note: BETA LACTAMASE POSITIVE     Performed at [facility]   Report Status 09/06/2013 FINAL   Final  MRSA PCR SCREENING     Status: None   Collection Time    08/30/13  7:11 PM      Result Value Ref Range Status   MRSA by PCR NEGATIVE  NEGATIVE Final   Comment:            The GeneXpert MRSA Assay (FDA     approved for NASAL specimens     only), is one component of a     comprehensive MRSA colonization     surveillance program. It is not     intended to diagnose MRSA     infection nor to guide or     monitor treatment for     MRSA infections.  URINE CULTURE     Status: None   Collection Time    08/30/13  7:12  PM      Result Value Ref Range Status   Specimen Description URINE, CATHETERIZED   Final   Special Requests NONE   Final   Culture  Setup Time     Final   Value: 08/31/2013 00:49     Performed at [place]     Final   Value: NO GROWTH     Performed at [facility]   Culture     Final   Value: NO GROWTH     Performed at [facility]   Report Status 09/01/2013 FINAL   Final  CULTURE, BLOOD (ROUTINE X 2)     Status: None   Collection Time    09/01/13 11:10 AM      Result Value Ref Range Status   Specimen Description BLOOD LEFT ANTECUBITAL   Final   Special Requests BOTTLES DRAWN AEROBIC ONLY [facility]   Final   Culture  Setup Time     Final   Value: 09/01/2013 14:01     Performed at [facility]  Culture     Final   Value:        BLOOD CULTURE RECEIVED NO GROWTH TO DATE CULTURE WILL BE HELD FOR 5 DAYS BEFORE ISSUING A FINAL NEGATIVE REPORT     Performed at [facility]   Report Status PENDING   Incomplete  CULTURE, BLOOD (ROUTINE X 2)     Status: None   Collection Time    09/01/13 11:20 AM      Result Value Ref Range Status   Specimen Description BLOOD LEFT HAND   Final   Special Requests BOTTLES DRAWN AEROBIC ONLY [place]   Final   Culture  Setup Time     Final   Value: 09/01/2013 14:01     Performed at [facility]   Culture     Final   Value:        BLOOD CULTURE RECEIVED NO GROWTH TO DATE CULTURE WILL BE HELD FOR 5 DAYS BEFORE ISSUING A FINAL NEGATIVE REPORT     Performed at [facility]   Report Status PENDING   Incomplete     Studies: Ct Angio Head W/cm &/or Wo Cm  09/05/2013   CLINICAL DATA:  Diabetes, hypertension, and prior stroke with left lower extremity abscess and sepsis. Multiple, likely embolic infarcts on MRI. Stroke workup.  EXAM: CT ANGIOGRAPHY HEAD AND NECK  TECHNIQUE: Multidetector CT imaging of the head and neck was performed using the standard protocol during bolus administration of  intravenous contrast. Multiplanar CT image reconstructions and MIPs were obtained to evaluate the vascular anatomy. Carotid stenosis measurements (when applicable) are obtained utilizing NASCET criteria, using the distal internal carotid diameter as the denominator.  CONTRAST:  59m OMNIPAQUE IOHEXOL 350 MG/ML SOLN  COMPARISON:  Brain MRI 09/03/2013  FINDINGS: CTA HEAD FINDINGS  Small foci of hypoattenuation are seen in the anterior body and genu of the corpus callosum, right globus pallidus, and left posterior left temporal periventricular white matter corresponding to acute infarcts described on recent MRI additional, punctate acute infarcts on the MRI are not well demonstrated on the CT. Remote infarcts are noted in both cerebellar hemispheres and in the left basal ganglia/ corona radiata. There is no evidence of acute large territory cortical infarct, intracranial hemorrhage, midline shift, or extra-axial fluid collection. There is mild ex vacuo dilatation of the body of the left lateral ventricle. No abnormal enhancement is identified. Orbits are unremarkable. Mastoid air cells and paranasal sinuses are clear.  The intracranial right vertebral artery is patent, dominant, and supplies the basilar. The left intracranial vertebral artery is markedly small in caliber and only intermittently patent. PICA origins appear patent. AICA and SCA origins are patent. Basilar artery is mildly irregular but without stenosis. There is a focal, mild-to-moderate distal right P1 stenosis. There is irregular, mild to moderate attenuation of left P2 and P3 branches. Posterior communicating arteries are not clearly identified.  The proximal right intracranial ICA is diffusely small in caliber with a severe stenosis at the petrous-cavernous junction. The right ICA becomes occluded at the anterior cavernous segment. There is minimal to no reconstitution of the right intracranial ICA distal to this. Multiple collateral vessels are seen  in the expected region of the right ICA terminus. These collateral vessels reconstitute the right MCA near the MCA bifurcation. Major right M2 branches appear patent although mildly smaller in caliber than those on the left. A small amount of contrast is present in the right A1 segment, with the A2 being supplied predominantly by  the left ACA via the anterior communicating artery, which appears somewhat bulbous.  The left internal carotid artery is patent from skullbase to carotid terminus. There is mild calcification and moderate irregularity of the left carotid siphon. There is mild narrowing of the left ICA at the petrous-cavernous junction. There is diffuse irregularity with mild to moderate tandem stenoses involving the anterior genu of the left cavernous carotid as well as the supraclinoid left ICA. There is mild stenosis of the proximal left A1 segment. There is also mild proximal left M1 stenosis. There is mild, diffuse irregularity of left M2 and more distal branches.  Review of the MIP images confirms the above findings.  CTA NECK FINDINGS  Three vessel aortic arch. Subclavian arteries are grossly unremarkable. Evaluation of the proximal common carotid arteries is limited by image noise due to shoulder attenuation as well as mild motion artifact. No mid or distal common carotid artery stenosis is seen. There is moderate focal narrowing of the right internal carotid artery at its origin. The right ICA obtains a more normal caliber for approximately 1 cm beyond the stenosis before becoming narrowed again and demonstrating a diffusely narrowed but smooth luminal contour for the remainder of its course in the neck. The right ICA lumen near its origin at the site of proximal stenosis measures approximately 2.3 mm in diameter, with the diffusely narrowed mid to distal right cervical ICA measuring approximately 2.7 mm in diameter. The left cervical ICA is mildly tortuous but patent and without stenosis.  The  proximal right vertebral artery is grossly patent but suboptimally evaluated due to artifact. The V2 and V3 segments of the right vertebral artery are normal in appearance. The left vertebral artery appears occluded at its origin with reconstitution at the C4 level, with the lumen of the vertebral artery being markedly small in caliber distal to this.  A left subclavian approach central venous catheter is partially visualized. Jugulodigastric lymph nodes measure up to 10 mm in short axis.  Review of the MIP images confirms the above findings.  IMPRESSION: 1. Occlusion of the intracranial right ICA. Multiple small collateral vessels in the region of the right ICA terminus is indicative of chronic occlusion. Right MCA is reconstituted new the MCA bifurcation, with slightly diminished distal flow compared to the left. Right ACA is mainly supplied via the anterior communicating artery. Right ICA is diffusely small in the neck, with moderate stenosis at its origin. 2. Moderate left carotid siphon atherosclerosis. Mild to moderate proximal right PCA stenosis. Mild to moderate attenuation of distal left PCA branch vessels. 3. 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 4. Dominant right vertebral artery without stenosis identified. 5. Small, acute bilateral infarcts as described on recent MRI.   Electronically Signed   By: Logan Bores   On: 09/05/2013 20:48   Ct Angio Neck W/cm &/or Wo/cm  09/05/2013   CLINICAL DATA:  Diabetes, hypertension, and prior stroke with left lower extremity abscess and sepsis. Multiple, likely embolic infarcts on MRI. Stroke workup.  EXAM: CT ANGIOGRAPHY HEAD AND NECK  TECHNIQUE: Multidetector CT imaging of the head and neck was performed using the standard protocol during bolus administration of intravenous contrast. Multiplanar CT image reconstructions and MIPs were obtained to evaluate the vascular anatomy. Carotid stenosis  measurements (when applicable) are obtained utilizing NASCET criteria, using the distal internal carotid diameter as the denominator.  CONTRAST:  85m OMNIPAQUE IOHEXOL 350 MG/ML SOLN  COMPARISON:  Brain MRI  09/03/2013  FINDINGS: CTA HEAD FINDINGS  Small foci of hypoattenuation are seen in the anterior body and genu of the corpus callosum, right globus pallidus, and left posterior left temporal periventricular white matter corresponding to acute infarcts described on recent MRI additional, punctate acute infarcts on the MRI are not well demonstrated on the CT. Remote infarcts are noted in both cerebellar hemispheres and in the left basal ganglia/ corona radiata. There is no evidence of acute large territory cortical infarct, intracranial hemorrhage, midline shift, or extra-axial fluid collection. There is mild ex vacuo dilatation of the body of the left lateral ventricle. No abnormal enhancement is identified. Orbits are unremarkable. Mastoid air cells and paranasal sinuses are clear.  The intracranial right vertebral artery is patent, dominant, and supplies the basilar. The left intracranial vertebral artery is markedly small in caliber and only intermittently patent. PICA origins appear patent. AICA and SCA origins are patent. Basilar artery is mildly irregular but without stenosis. There is a focal, mild-to-moderate distal right P1 stenosis. There is irregular, mild to moderate attenuation of left P2 and P3 branches. Posterior communicating arteries are not clearly identified.  The proximal right intracranial ICA is diffusely small in caliber with a severe stenosis at the petrous-cavernous junction. The right ICA becomes occluded at the anterior cavernous segment. There is minimal to no reconstitution of the right intracranial ICA distal to this. Multiple collateral vessels are seen in the expected region of the right ICA terminus. These collateral vessels reconstitute the right MCA near the MCA bifurcation.  Major right M2 branches appear patent although mildly smaller in caliber than those on the left. A small amount of contrast is present in the right A1 segment, with the A2 being supplied predominantly by the left ACA via the anterior communicating artery, which appears somewhat bulbous.  The left internal carotid artery is patent from skullbase to carotid terminus. There is mild calcification and moderate irregularity of the left carotid siphon. There is mild narrowing of the left ICA at the petrous-cavernous junction. There is diffuse irregularity with mild to moderate tandem stenoses involving the anterior genu of the left cavernous carotid as well as the supraclinoid left ICA. There is mild stenosis of the proximal left A1 segment. There is also mild proximal left M1 stenosis. There is mild, diffuse irregularity of left M2 and more distal branches.  Review of the MIP images confirms the above findings.  CTA NECK FINDINGS  Three vessel aortic arch. Subclavian arteries are grossly unremarkable. Evaluation of the proximal common carotid arteries is limited by image noise due to shoulder attenuation as well as mild motion artifact. No mid or distal common carotid artery stenosis is seen. There is moderate focal narrowing of the right internal carotid artery at its origin. The right ICA obtains a more normal caliber for approximately 1 cm beyond the stenosis before becoming narrowed again and demonstrating a diffusely narrowed but smooth luminal contour for the remainder of its course in the neck. The right ICA lumen near its origin at the site of proximal stenosis measures approximately 2.3 mm in diameter, with the diffusely narrowed mid to distal right cervical ICA measuring approximately 2.7 mm in diameter. The left cervical ICA is mildly tortuous but patent and without stenosis.  The proximal right vertebral artery is grossly patent but suboptimally evaluated due to artifact. The V2 and V3 segments of the right  vertebral artery are normal in appearance. The left vertebral artery appears occluded at its origin with reconstitution at the  C4 level, with the lumen of the vertebral artery being markedly small in caliber distal to this.  A left subclavian approach central venous catheter is partially visualized. Jugulodigastric lymph nodes measure up to 10 mm in short axis.  Review of the MIP images confirms the above findings.  IMPRESSION: 1. Occlusion of the intracranial right ICA. Multiple small collateral vessels in the region of the right ICA terminus is indicative of chronic occlusion. Right MCA is reconstituted new the MCA bifurcation, with slightly diminished distal flow compared to the left. Right ACA is mainly supplied via the anterior communicating artery. Right ICA is diffusely small in the neck, with moderate stenosis at its origin. 2. Moderate left carotid siphon atherosclerosis. Mild to moderate proximal right PCA stenosis. Mild to moderate attenuation of distal left PCA branch vessels. 3. 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 4. Dominant right vertebral artery without stenosis identified. 5. Small, acute bilateral infarcts as described on recent MRI.   Electronically Signed   By: Logan Bores   On: 09/05/2013 20:48   Dg Chest Port 1 View  09/05/2013   CLINICAL DATA:  Left-sided PICC placement.  EXAM: PORTABLE CHEST - 1 VIEW  COMPARISON:  Chest x-ray 08/31/2013.  FINDINGS: There is a left upper extremity PICC with tip terminating in the superior aspect of the right atrium (approximately 3 cm beyond the superior cavoatrial junction). No pneumothorax. No acute consolidative airspace disease. No pleural effusions. No evidence of pulmonary edema. Mild cardiomegaly. Upper mediastinal contours are within normal limits.  IMPRESSION: 1. Tip of PICC is in the right atrium approximately 3 cm distal to the superior cavoatrial junction. 2. Status post  extubation with significantly improved aeration. 3. Mild cardiomegaly.   Electronically Signed   By: Vinnie Langton M.D.   On: 09/05/2013 14:21    Scheduled Meds: .  stroke: mapping our early stages of recovery book   Does not apply Once  . antiseptic oral rinse  15 mL Mouth Rinse QID  . arformoterol  15 mcg Nebulization Q12H  . aspirin EC  325 mg Oral Daily  . atorvastatin  20 mg Oral q1800  . budesonide (PULMICORT) nebulizer solution  0.25 mg Nebulization Q12H  . chlorhexidine  15 mL Mouth Rinse BID  . feeding supplement (PRO-STAT SUGAR FREE 64)  30 mL Oral BID WC  . gemfibrozil  600 mg Oral BID AC  . heparin  5,000 Units Subcutaneous 3 times per day  . insulin aspart  0-20 Units Subcutaneous TID WC  . insulin aspart  6 Units Subcutaneous TID WC  . insulin glargine  22 Units Subcutaneous Daily  . insulin starter kit- syringes  1 kit Other Once  . piperacillin-tazobactam (ZOSYN)  IV  3.375 g Intravenous Q8H  . sodium hypochlorite   Irrigation BID  . [START ON 09/07/2013] vancomycin  1,500 mg Intravenous Q12H  . vancomycin  2,500 mg Intravenous Once   Continuous Infusions: . sodium chloride 10 mL/hr at 09/04/13 1734    Active Problems:   Thigh abscess   Sepsis   Diabetes mellitus with hyperglycemia   AKI (acute kidney injury)   Acute respiratory failure with hypoxia   Cerebral embolism with cerebral infarction  Time spent: 84mn  Case [place], [facility] Pager [phone] If 7PM-7AM, please contact night-coverage at [email or web address], password [facility]7/27/2015, 3:40 PM  LOS: 7 days

## 2013-09-06 NOTE — Progress Notes (Signed)
Occupational Therapy Evaluation Patient Details Name: [name] MRN: [id number] DOB: 10/24/1975 Today's Date: 09/06/2013    History of Present Illness 38 yo female developed boil in Lt thigh grossly 7/13. Presented with fever (Tm 102F), lethargy. Found to have Lt leg abscess complicated by sepsis, hyperglycemia. Was taken to OR for I&D and remained on vent post op. 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 PMH:  hx of DM, CVA basal ganglia / cerebellar infarct HTN, asthma.[name]    Clinical Impression   Pt has baseline total (A) for bathing from husband. Pt very reluctant to engage in OT evaluation this session. Pt reports extreme fatigue. Ot recommending SNF level care for d/c planning. Ot to follow acutely for balance and adl retraining for toileting.    Follow Up Recommendations  SNF    Equipment Recommendations  None recommended by OT    Recommendations for Other Services       Precautions / Restrictions Precautions Precautions: Fall      Mobility Bed Mobility Overal bed mobility: Needs Assistance Bed Mobility: Rolling;Sidelying to Sit;Supine to Sit Rolling: Min guard Sidelying to sit: Min guard;HOB elevated (bed rail) Supine to sit: Min guard;HOB elevated (bed rail) Sit to supine: Mod assist;HOB elevated   General bed mobility comments: pt needed max cues to continue to eOB. pt completed task and immediately reutnr to supine. Pt needed (A) to place Rt LE back in bed  Transfers                 General transfer comment: not assessed    Balance Overall balance assessment: Needs assistance Sitting-balance support: No upper extremity supported;Feet supported Sitting balance-Leahy Scale: Fair                                      ADL Overall ADL's : Needs assistance/impaired                                       General ADL Comments: Due  to participation level limited assessment. pt completed bed mobility and UE strength testing only     Vision                 Additional Comments: closing eyes and attempting to return to sleeping . pt needed cues to keep focused attention   Perception     Praxis      Pertinent Vitals/Pain Extreme fatigue     Hand Dominance Right   Extremity/Trunk Assessment Upper Extremity Assessment Upper Extremity Assessment: Overall WFL for tasks assessed   Lower Extremity Assessment Lower Extremity Assessment: Defer to PT evaluation   Cervical / Trunk Assessment Cervical / Trunk Assessment: Lordotic   Communication Communication Communication: No difficulties   Cognition Arousal/Alertness: Lethargic Behavior During Therapy: Flat affect Overall Cognitive Status: Impaired/Different from baseline Area of Impairment: Following commands   Current Attention Level: Focused   Following Commands: Follows one step commands inconsistently     Problem Solving: Slow processing;Decreased initiation General Comments: pt very reluctant to participate and needed strong encouragement. Pt reports "tired" pt giving simple one word answers. pt recognized family and states "huband and sister"   General Comments       Exercises       Shoulder Instructions  Home Living Family/patient expects to be discharged to:: Private residence Living Arrangements: Spouse/significant other;Children Available Help at Discharge: Family;Available 24 hours/day Type of Home: Apartment Home Access: Stairs to enter Entrance Stairs-Number of Steps: 14 Entrance Stairs-Rails: Right Home Layout: One level     Bathroom Shower/Tub: Other (comment) (sponge bath by husband by patients choice)   Bathroom Toilet: Standard     Home Equipment: None   Additional Comments: pt has husband sponge bath in bed for all bathing. Pt prefers husband do this task and husband present today to state "i do it " This  appears to be teh agreement in this relationship      Prior Functioning/Environment Level of Independence: Independent        Comments: pt has 11 mo, 37 yo and 38 yo    OT Diagnosis: Generalized weakness;Cognitive deficits   OT Problem List: Decreased strength;Decreased activity tolerance;Impaired balance (sitting and/or standing);Decreased safety awareness;Decreased cognition;Obesity   OT Treatment/Interventions: Self-care/ADL training;Neuromuscular education;Therapeutic exercise;DME and/or AE instruction;Therapeutic activities;Cognitive remediation/compensation;Patient/family education;Balance training    OT Goals(Current goals can be found in the care plan section) Acute Rehab OT Goals Patient Stated Goal: none states. "can I lay back down" OT Goal Formulation: With patient/family Time For Goal Achievement: 09/20/13 Potential to Achieve Goals: Good  OT Frequency: Min 2X/week   Barriers to D/C:            Co-evaluation              End of Session Nurse Communication: Mobility status;Precautions  Activity Tolerance: Patient limited by fatigue Patient left: in bed;with call bell/phone within reach;with bed alarm set;with family/visitor present   Time: 1450-1501 OT Time Calculation (min): 11 min Charges:  OT General Charges $OT Visit: 1 Procedure OT Evaluation $Initial OT Evaluation Tier I: 1 Procedure G-Codes:    [name] 2013/09/17, 3:19 PM Pager: [phone]

## 2013-09-07 ENCOUNTER — Encounter (HOSPITAL_COMMUNITY): Payer: Self-pay | Admitting: Internal Medicine

## 2013-09-07 ENCOUNTER — Encounter (HOSPITAL_COMMUNITY)
Admission: EM | Disposition: A | Discharge status: Skilled Nursing Facility | Payer: Self-pay | Source: Home / Self Care | Attending: Internal Medicine

## 2013-09-07 DIAGNOSIS — I38 Endocarditis, valve unspecified: Secondary | ICD-10-CM

## 2013-09-07 HISTORY — PX: TEE WITHOUT CARDIOVERSION: SHX5443

## 2013-09-07 LAB — DRUGS OF ABUSE SCREEN W/O ALC, ROUTINE URINE
Amphetamine Screen, Ur: NEGATIVE
Barbiturate Quant, Ur: NEGATIVE
Benzodiazepines.: NEGATIVE
Cocaine Metabolites: NEGATIVE
Creatinine,U: 127.6 mg/dL
METHADONE: NEGATIVE
Marijuana Metabolite: NEGATIVE
Opiate Screen, Urine: NEGATIVE
PHENCYCLIDINE (PCP): NEGATIVE
PROPOXYPHENE: NEGATIVE

## 2013-09-07 LAB — GLUCOSE, CAPILLARY
GLUCOSE-CAPILLARY: 125 mg/dL — AB (ref 70–99)
Glucose-Capillary: 96 mg/dL (ref 70–99)
Glucose-Capillary: 109 mg/dL — ABNORMAL HIGH (ref 70–99)
Glucose-Capillary: 124 mg/dL — ABNORMAL HIGH (ref 70–99)

## 2013-09-07 LAB — BASIC METABOLIC PANEL
ANION GAP: 11 (ref 5–15)
BUN: 20 mg/dL (ref 6–23)
CO2: 21 mEq/L (ref 19–32)
CREATININE: 1.25 mg/dL — AB (ref 0.50–1.10)
Calcium: 7.8 mg/dL — ABNORMAL LOW (ref 8.4–10.5)
Chloride: 105 mEq/L (ref 96–112)
GFR calc non Af Amer: 55 mL/min — ABNORMAL LOW (ref >90)
GFR, EST AFRICAN AMERICAN: 63 mL/min — AB (ref >90)
Glucose, Bld: 100 mg/dL — ABNORMAL HIGH (ref 70–99)
POTASSIUM: 4 meq/L (ref 3.7–5.3)
Sodium: 137 mEq/L (ref 137–147)

## 2013-09-07 LAB — CULTURE, BLOOD (ROUTINE X 2)
CULTURE: NO GROWTH
Culture: NO GROWTH

## 2013-09-07 LAB — CBC
HCT: 26.8 % — ABNORMAL LOW (ref 36.0–46.0)
HEMOGLOBIN: 8.6 g/dL — AB (ref 12.0–15.0)
MCH: 26.4 pg (ref 26.0–34.0)
MCHC: 32.1 g/dL (ref 30.0–36.0)
MCV: 82.2 fL (ref 78.0–100.0)
PLATELETS: 394 10*3/uL (ref 150–400)
RBC: 3.26 MIL/uL — AB (ref 3.87–5.11)
RDW: 14.3 % (ref 11.5–15.5)
WBC: 13.2 10*3/uL — ABNORMAL HIGH (ref 4.0–10.5)

## 2013-09-07 LAB — SEDIMENTATION RATE

## 2013-09-07 LAB — HIV ANTIBODY (ROUTINE TESTING W REFLEX): HIV 1&2 Ab, 4th Generation: NONREACTIVE

## 2013-09-07 LAB — RPR

## 2013-09-07 LAB — HOMOCYSTEINE: Homocysteine: 12.1 umol/L (ref 4.0–15.4)

## 2013-09-07 SURGERY — ECHOCARDIOGRAM, TRANSESOPHAGEAL
Anesthesia: Moderate Sedation

## 2013-09-07 MED ORDER — MIDAZOLAM HCL 5 MG/ML IJ SOLN
INTRAMUSCULAR | Status: AC
  Filled 2013-09-07: qty 1

## 2013-09-07 MED ORDER — LIDOCAINE VISCOUS 2 % MT SOLN
OROMUCOSAL | Status: DC | PRN
  Administered 2013-09-07: 10 mL via OROMUCOSAL

## 2013-09-07 MED ORDER — MIDAZOLAM HCL 5 MG/ML IJ SOLN
INTRAMUSCULAR | Status: AC
  Filled 2013-09-07: qty 2

## 2013-09-07 MED ORDER — DIPHENHYDRAMINE HCL 50 MG/ML IJ SOLN
INTRAMUSCULAR | Status: AC
  Filled 2013-09-07: qty 1

## 2013-09-07 MED ORDER — MIDAZOLAM HCL 10 MG/2ML IJ SOLN
INTRAMUSCULAR | Status: DC | PRN
Start: 2013-09-07 — End: 2013-09-07
  Administered 2013-09-07 (×2): 2 mg via INTRAVENOUS

## 2013-09-07 MED ORDER — LIDOCAINE VISCOUS 2 % MT SOLN
OROMUCOSAL | Status: AC
  Filled 2013-09-07: qty 15

## 2013-09-07 MED ORDER — SODIUM CHLORIDE 0.9 % IV SOLN: INTRAVENOUS | Status: DC

## 2013-09-07 MED ORDER — FENTANYL CITRATE 0.05 MG/ML IJ SOLN
INTRAMUSCULAR | Status: DC | PRN
  Administered 2013-09-07 (×2): 25 ug via INTRAVENOUS

## 2013-09-07 MED ORDER — FENTANYL CITRATE 0.05 MG/ML IJ SOLN
INTRAMUSCULAR | Status: AC
  Filled 2013-09-07: qty 2

## 2013-09-07 MED ORDER — BUTAMBEN-TETRACAINE-BENZOCAINE 2-2-14 % EX AERO
INHALATION_SPRAY | CUTANEOUS | Status: DC | PRN
  Administered 2013-09-07: 2 via TOPICAL

## 2013-09-07 NOTE — Progress Notes (Signed)
Patient has had multiple large loose stools that have leaked into dressing and in the wound. We cleaned the wound as much as we could, but it could not be completely cleared of stool. New dressing in place with topical solution. MD notified.

## 2013-09-07 NOTE — Progress Notes (Signed)
Echocardiogram Echocardiogram Transesophageal has been performed.  Dorothey BasemanReel, [name] M 09/07/2013, 9:52 AM

## 2013-09-07 NOTE — Progress Notes (Signed)
PT Cancellation Note  Patient Details Name: Linda HarmsDavenia [name] MRN: [id number] DOB: 01/17/1976   Cancelled Treatment:    Reason Eval/Treat Not Completed: Patient at procedure or test/unavailable. Pt off floor at TEE. PT to return as able.   Marcene BrawnChadwell, Sye Schroepfer Marie 09/07/2013, 8:52 AM  Lewis ShockAshly Tamanna Whitson, PT, DPT Pager #: [phone] Office #: [phone]

## 2013-09-07 NOTE — Progress Notes (Signed)
CS[facility] who stated they can no longer accept patient because their Medicaid beds are full. CSW continues to search for SNF bed.  Maree KrabbeLindsay Rontavious Albright, MSW, Theresia MajorsLCSWA [phone]

## 2013-09-07 NOTE — Progress Notes (Signed)
CSW attempted to call patient's spouse to explain that patient's bed [facility] was [facility] by [facility]. CSW left voicemail and is awaiting phone call.  Maree KrabbeLindsay Orlene Salmons, MSW, Theresia MajorsLCSWA [phone]

## 2013-09-07 NOTE — Progress Notes (Signed)
SLP Cancellation Note  Patient Details Name: Herold HarmsDavenia [name] MRN: [id number] DOB: 09/12/1975   Cancelled treatment:       Reason Eval/Treat Not Completed: Patient at procedure or test/unavailable.  SLP will follow up as able.  Fae PippinMelissa Othar Curto, M.A., CCC-SLP [phone]  Azie Mcconahy 09/07/2013, 9:52 AM

## 2013-09-07 NOTE — CV Procedure (Signed)
    TRANSESOPHAGEAL ECHOCARDIOGRAM (TEE) NOTE  INDICATIONS: infective endocarditis  PROCEDURE:   Informed consent was obtained prior to the procedure. The risks, benefits and alternatives for the procedure were discussed and the patient comprehended these risks.  Risks include, but are not limited to, cough, sore throat, vomiting, nausea, somnolence, esophageal and stomach trauma or perforation, bleeding, low blood pressure, aspiration, pneumonia, infection, trauma to the teeth and death.    After a procedural time-out, the patient was given 3 mg versed and 25 mcg fentanyl for moderate sedation.  The oropharynx was anesthetized 10 cc of topical 1% viscous lidocaine and a cetacaine spray.  The transesophageal probe was inserted in the esophagus and stomach without difficulty and multiple views were obtained.  The patient was kept under observation until the patient left the procedure room.  The patient left the procedure room in stable condition.   Agitated microbubble saline contrast was administered.  COMPLICATIONS:    There were no immediate complications.  Findings:  1. LEFT VENTRICLE: The left ventricular wall thickness is mildly increased.  The left ventricular cavity is normal in size. Wall motion is normal.  LVEF is 55-60%.  2. RIGHT VENTRICLE:  The right ventricle is normal in structure and function without any thrombus or masses.    3. LEFT ATRIUM:  The left atrium is mildly increased in size without any thrombus or masses.  There is not spontaneous echo contrast ("smoke") in the left atrium consistent with a low flow state.  4. LEFT ATRIAL APPENDAGE:  The left atrial appendage is free of any thrombus or masses. The appendage has single lobes. Pulse doppler indicates moderate flow in the appendage.  5. ATRIAL SEPTUM:  The atrial septum appears intact and is free of thrombus and/or masses.  There is no evidence for interatrial shunting by color doppler and saline  microbubble.  6. RIGHT ATRIUM:  The right atrium is normal in size and function without any thrombus or masses.  7. MITRAL VALVE:  The mitral valve is normal in structure and function with trivial regurgitation.  There were no vegetations or stenosis.  8. AORTIC VALVE:  The aortic valve is trileaflet, normal in structure and function with no regurgitation.  There were no vegetations or stenosis  9. TRICUSPID VALVE:  The tricuspid valve is normal in structure and function with trivial regurgitation.  There were no vegetations or stenosis  10.  PULMONIC VALVE:  The pulmonic valve is normal in structure and function with no regurgitation.  There were no vegetations or stenosis.   11. AORTIC ARCH, ASCENDING AND DESCENDING AORTA:  There was no atherosclerosis of the ascending aorta, aortic arch, or proximal descending aorta.  12. PULMONARY VEINS: Anomalous pulmonary venous return was not noted. There is high IVC flow noted into the right atrium. The TIP of the PICC line is noted in the SVC.  13. PERICARDIUM: The pericardium appeared normal and non-thickened.  There is no pericardial effusion.  IMPRESSION:   1. No LAA thrombus 2. No PFO or ASD 3. Normal LV function 4. No valvular disease or sign of endocarditis or abscess  RECOMMENDATIONS:    1.  Further work-up per neurology.  Time Spent Directly with the Patient:  45 minutes   Chrystie NoseKenneth C. Ltanya Bayley, MD, [facility]ACC Attending Cardiologist [facility]  09/07/2013, 9:45 AM

## 2013-09-07 NOTE — Progress Notes (Signed)
Speech Language Pathology Treatment: Cognitive-Linquistic  Patient Details Name: [name] MRN: [id number] DOB: 09/08/1975 Today's Date: 09/07/2013 Time: [id number] SLP Time Calculation (min): 15 min  Assessment / Plan / Recommendation Clinical Impression  Skilled treatment session focused on addressing cognition goals and patient education.  SLP facilitated session with discussion regarding orientation information; patient required Min assist to utilize external aids to recall daily information. Patient also required Min question cues to appropriately return demonstration of call bell.  Patient recalled event with nursing and her inability to walk to recliner; however, stated that if she had a walker she would have been able to do it.  Patient reports that she does not have any deficits following her CVAs and is ready to go home.  SLP provided Total assist multimodal cues druing planned failures to identify deficits.  Her poor awareness and recall lead to very poor insight and frequently leads to refusal to participate in OT/PT therapy, despite her needs.  After SLP informed her that if she cannot show us she can take care of her self then she will need to go someplace where she can be taken care of patient requested SLP provide her with a word search to help with her thinking skills.    Recommend CIR consult despite poor OT/PT participation due to patient's very poor insight at this time.  SLP will continue to follow acutely and she will require services at the next level of care as well.     HPI HPI: [name] is a 38 y.o. female with diabetes mellitus, hypertension, previous stroke and morbid obesity who was admitted on 08/30/2013 for management of left lower extremity abscess complicated by sepsis and hyperglycemia. Abscess was incised and drained on the day of admission. Patient remained intubated postop and was placed on mechanical ventilation. She was subsequently extubated on 08/31/2013.  She continued to demonstrate mental status changes and an MRI was obtained on 09/03/2013 which showed multiple small infarcts involving right and left hemispheres, corpus callosum and medulla with a pattern consistent with embolic phenomena. Patient has not demonstrated a change in speech nor focal motor abnormalities. NIH stroke score was 2 for mental status changes.  Orders received for cognitive-linguistic evaluation.     Pertinent Vitals None  SLP Plan  Continue with current plan of care    Recommendations                General recommendations: Rehab consult Follow up Recommendations: 24 hour supervision/assistance;Inpatient Rehab Plan: Continue with current plan of care    GO     Linda FerrettiMelissa Yolinda [name], M.A., CCC-SLP [phone]  [name] 09/07/2013, 4:31 PM

## 2013-09-07 NOTE — Progress Notes (Addendum)
[facility] PROGRESS NOTE  Saundra Gin PYK:[id number] DOB: 03-23-1975 DOA: 08/30/2013 PCP: Barbette Merino, MD  Off Service Summary: 38yo poorly controlled diabetic who initially presented for debridement of necrotic thigh abscess. She was initially intubated and later extubated for surgery. After extubation, the patient was found to be persistently lethargic. An MRI was obtained which revealed B embolic infarcts. Neuro consulted and TEE found to be unremarkable - no vegetations. Neuro has recommended hypercoag panel and continuing aspirin. Pt is continued on vanc and zosyn for thigh wound, for which surgery is following.   Assessment/Plan: Multiple B embolic infarcts  - Noted on MRI - CTA head and neck results noted, RICA with signs of chronic occlusion - 2d echo unremarkable, follow up TEE planned for today - Cont rectal ASA for now - Consulted Cardiology for TEE, with no signs of endocarditis - PT/OT/SLP -Broadened coverage with addition of vanc as septic embolism is considered - Repeat blood cultures pending - Neuro had been following, now signed off. Final recs per Neuro are for f/u of hypercoag labs and cont aspirin Left thigh wound s/p surgical drainage and debridement of abscess  - Second set of blood cultures, NGTD, repeat cultures pending - Urine culture negative.  - Wound culture shows no growth. Gram stain shows abundant gm neg rods and moderate GPC in pairs.  - Continue zosyn with addition of empiric vanc as noted above given concerns of septic emboli - Discontinued hydrotherapy per [facility]  Sepsis.  Resolved Acute encephalopathy secondary to Sepsis vs CVA Stable, w/o significant change Acute respiratory failure secondary to sepsis with history of asthma.  Resolved; previously required intubation during this hospitalization.  On min o2 support Continue pulmicort, brovana with prn albuterol as needed  Acute kidney injury  Due to sepsis and acute infection.  Much  improved Hyponatremia.  Now resolved with IVF.  Leukocytosis.  Continuing to trend down Microcytic Anemia  Baseline appears to be 13 - 14.  Hemoglobin and Hematocrit both trending up  Anemia panel pending  Diabetes Mellitus type II with hyperglycemia.  Last A1C 15.4  CBGs persistently high; this morning's reading (7/24) at 213. Continue to follow routinely.  Started ssi resistant, meal coverage and lantus qhs. Will monitor and titrate as needed  High triglycerides  Serum triglycerides 550.  Started gemfibrazil  Disposition:  Remain inpatient  DVT Prophylaxis:  Prophylactic Heparin with SCDs  Code Status: Full Family Communication: Pt in room Disposition Plan: Pending  Consultants:  Neurology  Surgery  Cardiology  Procedures:    Antibiotics:  Vanc 7/20>>>7/24>>>7/27(resumed)>>>  Zosyn 7/20>>>  HPI/Subjective: No acute events overnight. Pt eager to go home.  Objective: Filed Vitals:   09/06/13 1916 09/06/13 2027 09/07/13 0115 09/07/13 0634  BP:  157/79 120/73 153/83  Pulse:  107 92 89  Temp:  99.9 F (37.7 C) 98.5 F (36.9 C) 98.5 F (36.9 C)  TempSrc:  Oral Axillary Axillary  Resp:  $Remo'18 18 18  'SaQKR$ Height:      Weight:      SpO2: 99% 99% 98% 99%    Intake/Output Summary (Last 24 hours) at 09/07/13 0759 Last data filed at 09/06/13 1824  Gross per 24 hour  Intake     10 ml  Output    600 ml  Net   -590 ml   Filed Weights   08/30/13 2016 09/01/13 2134 09/02/13 0635  Weight: 135.5 kg (298 lb 11.6 oz) 144.8 kg (319 lb 3.6 oz) 144.8 kg (319 lb 3.6 oz)  Exam:   General:  Arousable, in nad, follows command  Cardiovascular: regular, s1, s2  Respiratory: normal resp effort, no wheezing  Abdomen: soft,nondistended  Musculoskeletal: perfused, no clubbing   Data Reviewed: Basic Metabolic Panel:  Recent Labs Lab 09/03/13 0500 09/04/13 0442 09/05/13 0549 09/06/13 0930 09/07/13 0525  NA 140 137 138 134* 137  K 4.2 4.7 3.7 4.2 4.0  CL 106  103 96 100 105  CO2 21 15* $Remo'23 22 21  'pCGhn$ GLUCOSE 171* 164* 152* 235* 100*  BUN 35* 29* $Remov'11 23 20  'WbcCfs$ CREATININE 1.10 1.01 0.75 1.09 1.25*  CALCIUM 7.9* 8.2* 8.8 8.0* 7.8*   Liver Function Tests: No results found for this basename: AST, ALT, ALKPHOS, BILITOT, PROT, ALBUMIN,  in the last 168 hours No results found for this basename: LIPASE, AMYLASE,  in the last 168 hours No results found for this basename: AMMONIA,  in the last 168 hours CBC:  Recent Labs Lab 09/02/13 0530 09/03/13 0500 09/04/13 0442 09/06/13 0930 09/07/13 0525  WBC 18.1* 17.0* 11.6* 14.6* 13.2*  HGB 9.3* 9.7* 11.6* 9.4* 8.6*  HCT 28.1* 29.4* 35.9* 28.5* 26.8*  MCV 78.7 80.8 83.5 81.7 82.2  PLT 270 335 350 406* 394   Cardiac Enzymes: No results found for this basename: CKTOTAL, CKMB, CKMBINDEX, TROPONINI,  in the last 168 hours BNP (last 3 results) No results found for this basename: PROBNP,  in the last 8760 hours CBG:  Recent Labs Lab 09/06/13 0636 09/06/13 1137 09/06/13 1637 09/06/13 2204 09/07/13 0633  GLUCAP 196* 245* 167* 135* 96    Recent Results (from the past 240 hour(s))  CULTURE, BLOOD (ROUTINE X 2)     Status: None   Collection Time    08/30/13  2:29 PM      Result Value Ref Range Status   Specimen Description BLOOD LEFT ANTECUBITAL   Final   Special Requests     Final   Value: BOTTLES DRAWN AEROBIC AND ANAEROBIC 6CC AER 5CC ANA   Culture  Setup Time     Final   Value: 08/30/2013 19:36     Performed at [facility]   Culture     Final   Value: NO GROWTH 5 DAYS     Performed at [facility]   Report Status 09/05/2013 FINAL   Final  CULTURE, BLOOD (ROUTINE X 2)     Status: None   Collection Time    08/30/13  2:41 PM      Result Value Ref Range Status   Specimen Description BLOOD LEFT HAND   Final   Special Requests BOTTLES DRAWN AEROBIC AND ANAEROBIC [facility]   Final   Culture  Setup Time     Final   Value: 08/30/2013 19:36     Performed at [facility]   Culture      Final   Value: STAPHYLOCOCCUS SPECIES (COAGULASE NEGATIVE)     Note: THE SIGNIFICANCE OF ISOLATING THIS ORGANISM FROM A SINGLE SET OF BLOOD CULTURES WHEN MULTIPLE SETS ARE DRAWN IS UNCERTAIN. PLEASE NOTIFY THE MICROBIOLOGY DEPARTMENT WITHIN ONE WEEK IF SPECIATION AND SENSITIVITIES ARE REQUIRED.     Note: Gram Stain Report Called to,Read Back By and Verified With: SARA G$[email or web address]'@12'[email or web address]$ :54PM ON 08/31/13 BY DANTS     Performed at [facility]   Report Status 09/01/2013 FINAL   Final  WOUND CULTURE     Status: None   Collection Time    08/30/13  5:06 PM      Result  Value Ref Range Status   Specimen Description WOUND THIGH LEFT   Final   Special Requests PATIENT ON FOLLOWING VANCOMYCIN, ZOSYN   Final   Gram Stain     Final   Value: RARE WBC PRESENT, PREDOMINANTLY MONONUCLEAR     NO SQUAMOUS EPITHELIAL CELLS SEEN     ABUNDANT GRAM NEGATIVE RODS     FEW GRAM POSITIVE COCCI IN PAIRS     IN CLUSTERS FEW GRAM POSITIVE RODS   Culture     Final   Value: NO GROWTH 2 DAYS     Performed at [facility]   Report Status 09/02/2013 FINAL   Final  ANAEROBIC CULTURE     Status: None   Collection Time    08/30/13  5:06 PM      Result Value Ref Range Status   Specimen Description WOUND THIGH LEFT   Final   Special Requests PATIENT ON FOLLOWING VANCOMYCIN, ZOSYN   Final   Gram Stain     Final   Value: RARE WBC PRESENT, PREDOMINANTLY MONONUCLEAR     NO SQUAMOUS EPITHELIAL CELLS SEEN     ABUNDANT GRAM NEGATIVE RODS     FEW GRAM POSITIVE COCCI IN PAIRS     IN CLUSTERS FEW GRAM POSITIVE RODS   Culture     Final   Value: ABUNDANT BACTEROIDES UNIFORMIS     Note: BETA LACTAMASE POSITIVE     Performed at [facility]   Report Status 09/06/2013 FINAL   Final  MRSA PCR SCREENING     Status: None   Collection Time    08/30/13  7:11 PM      Result Value Ref Range Status   MRSA by PCR NEGATIVE  NEGATIVE Final   Comment:            The GeneXpert MRSA Assay (FDA     approved for NASAL specimens      only), is one component of a     comprehensive MRSA colonization     surveillance program. It is not     intended to diagnose MRSA     infection nor to guide or     monitor treatment for     MRSA infections.  URINE CULTURE     Status: None   Collection Time    08/30/13  7:12 PM      Result Value Ref Range Status   Specimen Description URINE, CATHETERIZED   Final   Special Requests NONE   Final   Culture  Setup Time     Final   Value: 08/31/2013 00:49     Performed at [facility]     Final   Value: NO GROWTH     Performed at [facility]   Culture     Final   Value: NO GROWTH     Performed at [facility]   Report Status 09/01/2013 FINAL   Final  CULTURE, BLOOD (ROUTINE X 2)     Status: None   Collection Time    09/01/13 11:10 AM      Result Value Ref Range Status   Specimen Description BLOOD LEFT ANTECUBITAL   Final   Special Requests BOTTLES DRAWN AEROBIC ONLY [facility]   Final   Culture  Setup Time     Final   Value: 09/01/2013 14:01     Performed at [facility]   Culture     Final   Value:  BLOOD CULTURE RECEIVED NO GROWTH TO DATE CULTURE WILL BE HELD FOR 5 DAYS BEFORE ISSUING A FINAL NEGATIVE REPORT     Performed at [facility]   Report Status PENDING   Incomplete  CULTURE, BLOOD (ROUTINE X 2)     Status: None   Collection Time    09/01/13 11:20 AM      Result Value Ref Range Status   Specimen Description BLOOD LEFT HAND   Final   Special Requests BOTTLES DRAWN AEROBIC ONLY [place]   Final   Culture  Setup Time     Final   Value: 09/01/2013 14:01     Performed at [facility]   Culture     Final   Value:        BLOOD CULTURE RECEIVED NO GROWTH TO DATE CULTURE WILL BE HELD FOR 5 DAYS BEFORE ISSUING A FINAL NEGATIVE REPORT     Performed at [facility]   Report Status PENDING   Incomplete     Studies: Ct Angio Head W/cm &/or Wo Cm  09/05/2013   CLINICAL DATA:  Diabetes, hypertension, and  prior stroke with left lower extremity abscess and sepsis. Multiple, likely embolic infarcts on MRI. Stroke workup.  EXAM: CT ANGIOGRAPHY HEAD AND NECK  TECHNIQUE: Multidetector CT imaging of the head and neck was performed using the standard protocol during bolus administration of intravenous contrast. Multiplanar CT image reconstructions and MIPs were obtained to evaluate the vascular anatomy. Carotid stenosis measurements (when applicable) are obtained utilizing NASCET criteria, using the distal internal carotid diameter as the denominator.  CONTRAST:  65mL OMNIPAQUE IOHEXOL 350 MG/ML SOLN  COMPARISON:  Brain MRI 09/03/2013  FINDINGS: CTA HEAD FINDINGS  Small foci of hypoattenuation are seen in the anterior body and genu of the corpus callosum, right globus pallidus, and left posterior left temporal periventricular white matter corresponding to acute infarcts described on recent MRI additional, punctate acute infarcts on the MRI are not well demonstrated on the CT. Remote infarcts are noted in both cerebellar hemispheres and in the left basal ganglia/ corona radiata. There is no evidence of acute large territory cortical infarct, intracranial hemorrhage, midline shift, or extra-axial fluid collection. There is mild ex vacuo dilatation of the body of the left lateral ventricle. No abnormal enhancement is identified. Orbits are unremarkable. Mastoid air cells and paranasal sinuses are clear.  The intracranial right vertebral artery is patent, dominant, and supplies the basilar. The left intracranial vertebral artery is markedly small in caliber and only intermittently patent. PICA origins appear patent. AICA and SCA origins are patent. Basilar artery is mildly irregular but without stenosis. There is a focal, mild-to-moderate distal right P1 stenosis. There is irregular, mild to moderate attenuation of left P2 and P3 branches. Posterior communicating arteries are not clearly identified.  The proximal right  intracranial ICA is diffusely small in caliber with a severe stenosis at the petrous-cavernous junction. The right ICA becomes occluded at the anterior cavernous segment. There is minimal to no reconstitution of the right intracranial ICA distal to this. Multiple collateral vessels are seen in the expected region of the right ICA terminus. These collateral vessels reconstitute the right MCA near the MCA bifurcation. Major right M2 branches appear patent although mildly smaller in caliber than those on the left. A small amount of contrast is present in the right A1 segment, with the A2 being supplied predominantly by the left ACA via the anterior communicating artery, which appears somewhat bulbous.  The left internal  carotid artery is patent from skullbase to carotid terminus. There is mild calcification and moderate irregularity of the left carotid siphon. There is mild narrowing of the left ICA at the petrous-cavernous junction. There is diffuse irregularity with mild to moderate tandem stenoses involving the anterior genu of the left cavernous carotid as well as the supraclinoid left ICA. There is mild stenosis of the proximal left A1 segment. There is also mild proximal left M1 stenosis. There is mild, diffuse irregularity of left M2 and more distal branches.  Review of the MIP images confirms the above findings.  CTA NECK FINDINGS  Three vessel aortic arch. Subclavian arteries are grossly unremarkable. Evaluation of the proximal common carotid arteries is limited by image noise due to shoulder attenuation as well as mild motion artifact. No mid or distal common carotid artery stenosis is seen. There is moderate focal narrowing of the right internal carotid artery at its origin. The right ICA obtains a more normal caliber for approximately 1 cm beyond the stenosis before becoming narrowed again and demonstrating a diffusely narrowed but smooth luminal contour for the remainder of its course in the neck. The  right ICA lumen near its origin at the site of proximal stenosis measures approximately 2.3 mm in diameter, with the diffusely narrowed mid to distal right cervical ICA measuring approximately 2.7 mm in diameter. The left cervical ICA is mildly tortuous but patent and without stenosis.  The proximal right vertebral artery is grossly patent but suboptimally evaluated due to artifact. The V2 and V3 segments of the right vertebral artery are normal in appearance. The left vertebral artery appears occluded at its origin with reconstitution at the C4 level, with the lumen of the vertebral artery being markedly small in caliber distal to this.  A left subclavian approach central venous catheter is partially visualized. Jugulodigastric lymph nodes measure up to 10 mm in short axis.  Review of the MIP images confirms the above findings.  IMPRESSION: 1. Occlusion of the intracranial right ICA. Multiple small collateral vessels in the region of the right ICA terminus is indicative of chronic occlusion. Right MCA is reconstituted new the MCA bifurcation, with slightly diminished distal flow compared to the left. Right ACA is mainly supplied via the anterior communicating artery. Right ICA is diffusely small in the neck, with moderate stenosis at its origin. 2. Moderate left carotid siphon atherosclerosis. Mild to moderate proximal right PCA stenosis. Mild to moderate attenuation of distal left PCA branch vessels. 3. 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 4. Dominant right vertebral artery without stenosis identified. 5. Small, acute bilateral infarcts as described on recent MRI.   Electronically Signed   By: Logan Bores   On: 09/05/2013 20:48   Ct Angio Neck W/cm &/or Wo/cm  09/05/2013   CLINICAL DATA:  Diabetes, hypertension, and prior stroke with left lower extremity abscess and sepsis. Multiple, likely embolic infarcts on MRI. Stroke workup.  EXAM: CT  ANGIOGRAPHY HEAD AND NECK  TECHNIQUE: Multidetector CT imaging of the head and neck was performed using the standard protocol during bolus administration of intravenous contrast. Multiplanar CT image reconstructions and MIPs were obtained to evaluate the vascular anatomy. Carotid stenosis measurements (when applicable) are obtained utilizing NASCET criteria, using the distal internal carotid diameter as the denominator.  CONTRAST:  71mL OMNIPAQUE IOHEXOL 350 MG/ML SOLN  COMPARISON:  Brain MRI 09/03/2013  FINDINGS: CTA HEAD FINDINGS  Small foci of hypoattenuation are seen in the anterior  body and genu of the corpus callosum, right globus pallidus, and left posterior left temporal periventricular white matter corresponding to acute infarcts described on recent MRI additional, punctate acute infarcts on the MRI are not well demonstrated on the CT. Remote infarcts are noted in both cerebellar hemispheres and in the left basal ganglia/ corona radiata. There is no evidence of acute large territory cortical infarct, intracranial hemorrhage, midline shift, or extra-axial fluid collection. There is mild ex vacuo dilatation of the body of the left lateral ventricle. No abnormal enhancement is identified. Orbits are unremarkable. Mastoid air cells and paranasal sinuses are clear.  The intracranial right vertebral artery is patent, dominant, and supplies the basilar. The left intracranial vertebral artery is markedly small in caliber and only intermittently patent. PICA origins appear patent. AICA and SCA origins are patent. Basilar artery is mildly irregular but without stenosis. There is a focal, mild-to-moderate distal right P1 stenosis. There is irregular, mild to moderate attenuation of left P2 and P3 branches. Posterior communicating arteries are not clearly identified.  The proximal right intracranial ICA is diffusely small in caliber with a severe stenosis at the petrous-cavernous junction. The right ICA becomes  occluded at the anterior cavernous segment. There is minimal to no reconstitution of the right intracranial ICA distal to this. Multiple collateral vessels are seen in the expected region of the right ICA terminus. These collateral vessels reconstitute the right MCA near the MCA bifurcation. Major right M2 branches appear patent although mildly smaller in caliber than those on the left. A small amount of contrast is present in the right A1 segment, with the A2 being supplied predominantly by the left ACA via the anterior communicating artery, which appears somewhat bulbous.  The left internal carotid artery is patent from skullbase to carotid terminus. There is mild calcification and moderate irregularity of the left carotid siphon. There is mild narrowing of the left ICA at the petrous-cavernous junction. There is diffuse irregularity with mild to moderate tandem stenoses involving the anterior genu of the left cavernous carotid as well as the supraclinoid left ICA. There is mild stenosis of the proximal left A1 segment. There is also mild proximal left M1 stenosis. There is mild, diffuse irregularity of left M2 and more distal branches.  Review of the MIP images confirms the above findings.  CTA NECK FINDINGS  Three vessel aortic arch. Subclavian arteries are grossly unremarkable. Evaluation of the proximal common carotid arteries is limited by image noise due to shoulder attenuation as well as mild motion artifact. No mid or distal common carotid artery stenosis is seen. There is moderate focal narrowing of the right internal carotid artery at its origin. The right ICA obtains a more normal caliber for approximately 1 cm beyond the stenosis before becoming narrowed again and demonstrating a diffusely narrowed but smooth luminal contour for the remainder of its course in the neck. The right ICA lumen near its origin at the site of proximal stenosis measures approximately 2.3 mm in diameter, with the diffusely  narrowed mid to distal right cervical ICA measuring approximately 2.7 mm in diameter. The left cervical ICA is mildly tortuous but patent and without stenosis.  The proximal right vertebral artery is grossly patent but suboptimally evaluated due to artifact. The V2 and V3 segments of the right vertebral artery are normal in appearance. The left vertebral artery appears occluded at its origin with reconstitution at the C4 level, with the lumen of the vertebral artery being markedly small in caliber distal to  this.  A left subclavian approach central venous catheter is partially visualized. Jugulodigastric lymph nodes measure up to 10 mm in short axis.  Review of the MIP images confirms the above findings.  IMPRESSION: 1. Occlusion of the intracranial right ICA. Multiple small collateral vessels in the region of the right ICA terminus is indicative of chronic occlusion. Right MCA is reconstituted new the MCA bifurcation, with slightly diminished distal flow compared to the left. Right ACA is mainly supplied via the anterior communicating artery. Right ICA is diffusely small in the neck, with moderate stenosis at its origin. 2. Moderate left carotid siphon atherosclerosis. Mild to moderate proximal right PCA stenosis. Mild to moderate attenuation of distal left PCA branch vessels. 3. 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 4. Dominant right vertebral artery without stenosis identified. 5. Small, acute bilateral infarcts as described on recent MRI.   Electronically Signed   By: Logan Bores   On: 09/05/2013 20:48   Dg Chest Port 1 View  09/05/2013   CLINICAL DATA:  Left-sided PICC placement.  EXAM: PORTABLE CHEST - 1 VIEW  COMPARISON:  Chest x-ray 08/31/2013.  FINDINGS: There is a left upper extremity PICC with tip terminating in the superior aspect of the right atrium (approximately 3 cm beyond the superior cavoatrial junction). No pneumothorax. No acute  consolidative airspace disease. No pleural effusions. No evidence of pulmonary edema. Mild cardiomegaly. Upper mediastinal contours are within normal limits.  IMPRESSION: 1. Tip of PICC is in the right atrium approximately 3 cm distal to the superior cavoatrial junction. 2. Status post extubation with significantly improved aeration. 3. Mild cardiomegaly.   Electronically Signed   By: Vinnie Langton M.D.   On: 09/05/2013 14:21    Scheduled Meds: .  stroke: mapping our early stages of recovery book   Does not apply Once  . antiseptic oral rinse  15 mL Mouth Rinse QID  . arformoterol  15 mcg Nebulization Q12H  . aspirin EC  325 mg Oral Daily  . atorvastatin  20 mg Oral q1800  . budesonide (PULMICORT) nebulizer solution  0.25 mg Nebulization Q12H  . chlorhexidine  15 mL Mouth Rinse BID  . feeding supplement (PRO-STAT SUGAR FREE 64)  30 mL Oral BID WC  . gemfibrozil  600 mg Oral BID AC  . heparin  5,000 Units Subcutaneous 3 times per day  . insulin aspart  0-20 Units Subcutaneous TID WC  . insulin aspart  6 Units Subcutaneous TID WC  . insulin glargine  22 Units Subcutaneous Daily  . insulin starter kit- syringes  1 kit Other Once  . piperacillin-tazobactam (ZOSYN)  IV  3.375 g Intravenous Q8H  . sodium hypochlorite   Irrigation BID  . vancomycin  1,500 mg Intravenous Q12H   Continuous Infusions: . sodium chloride 10 mL/hr at 09/04/13 1734    Active Problems:   Thigh abscess   Sepsis   Diabetes mellitus with hyperglycemia   AKI (acute kidney injury)   Acute respiratory failure with hypoxia   Cerebral embolism with cerebral infarction  Time spent: 103min  Myrtle Barnhard, [facility] Pager [phone]. If 7PM-7AM, please contact night-coverage at [email or web address], password [facility] 09/07/2013, 7:59 AM  LOS: 8 days

## 2013-09-07 NOTE — Progress Notes (Signed)
Stroke Team Progress Note  HISTORY [name] is a 38 y.o. female with diabetes mellitus, hypertension, previous stroke and morbid obesity who was admitted on 08/30/2013 for management of left lower extremity abscess complicated by sepsis and hyperglycemia. Abscess was incised and drained on the day of admission. Patient remained intubated postop and was placed on mechanical ventilation. She was subsequently extubated on 08/31/2013. She continued to demonstrate mental status changes and an MRI was obtained on 09/03/2013 which showed multiple small infarcts involving right and left hemispheres, corpus callosum and medulla with a pattern consistent with embolic phenomena. Patient has not demonstrated a change in speech nor focal motor abnormalities. NIH stroke score was 2 for mental status changes.   LSN: Unclear  tPA Given: No: No clear when last known well; no objective focal deficits.  MRankin: 3  SUBJECTIVE No family at bedside. She reports she is just back from TEE where they told her everything was ok. Said the MD was speaking with her husband currently.   OBJECTIVE Most recent Vital Signs: Filed Vitals:   09/07/13 0925 09/07/13 0930 09/07/13 0935 09/07/13 0945  BP: 167/96 170/103 177/103 172/102  Pulse: 92 92 93 93  Temp:      TempSrc:      Resp: _0 Height:      Weight:      SpO2: 100% 100% 100% 100%   CBG (last 3)   Recent Labs  09/06/13 1637 09/06/13 2204 09/07/13 0633  GLUCAP 167* 135* 96    IV Fluid Intake:   . sodium chloride 10 mL/hr at 09/04/13 1734  . sodium chloride      MEDICATIONS  .  stroke: mapping our early stages of recovery book   Does not apply Once  . antiseptic oral rinse  15 mL Mouth Rinse QID  . arformoterol  15 mcg Nebulization Q12H  . aspirin EC  325 mg Oral Daily  . atorvastatin  20 mg Oral q1800  . budesonide (PULMICORT) nebulizer solution  0.25 mg Nebulization Q12H  . chlorhexidine  15 mL Mouth Rinse BID  . feeding supplement  (PRO-STAT SUGAR FREE 64)  30 mL Oral BID WC  . gemfibrozil  600 mg Oral BID AC  . heparin  5,000 Units Subcutaneous 3 times per day  . insulin aspart  0-20 Units Subcutaneous TID WC  . insulin aspart  6 Units Subcutaneous TID WC  . insulin glargine  22 Units Subcutaneous Daily  . insulin starter kit- syringes  1 kit Other Once  . piperacillin-tazobactam (ZOSYN)  IV  3.375 g Intravenous Q8H  . sodium hypochlorite   Irrigation BID  . vancomycin  1,500 mg Intravenous Q12H   PRN:  acetaminophen, acetaminophen, albuterol, hydrALAZINE, sodium chloride, sodium chloride  Diet:  NPO  Activity:  Bedrest, up in chair, progress activity DVT Prophylaxis:  SCDs / subcutaneous heparin  CLINICALLY SIGNIFICANT STUDIES Basic Metabolic Panel:   Recent Labs Lab 09/06/13 0930 09/07/13 0525  NA 134* 137  K 4.2 4.0  CL 100 105  CO2 22 21  GLUCOSE 235* 100*  BUN 23 20  CREATININE 1.09 1.25*  CALCIUM 8.0* 7.8*   Liver Function Tests:  No results found for this basename: AST, ALT, ALKPHOS, BILITOT, PROT, ALBUMIN,  in the last 168 hours CBC:   Recent Labs Lab 09/06/13 0930 09/07/13 0525  WBC 14.6* 13.2*  HGB 9.4* 8.6*  HCT 28.5* 26.8*  MCV 81.7 82.2  PLT 406* 394   Coagulation: No results  found for this basename: LABPROT, INR,  in the last 168 hours Cardiac Enzymes: No results found for this basename: CKTOTAL, CKMB, CKMBINDEX, TROPONINI,  in the last 168 hours Urinalysis:   Recent Labs Lab 09/05/13 1822  COLORURINE YELLOW  LABSPEC 1.021  PHURINE 5.0  GLUCOSEU NEGATIVE  HGBUR LARGE*  BILIRUBINUR NEGATIVE  KETONESUR NEGATIVE  PROTEINUR 100*  UROBILINOGEN 0.2  NITRITE NEGATIVE  LEUKOCYTESUR LARGE*   Lipid Panel    Component Value Date/Time   CHOL 215* 09/05/2013 0549   TRIG 120 09/05/2013 0549   HDL 61 09/05/2013 0549   CHOLHDL 3.5 09/05/2013 0549   VLDL 24 09/05/2013 0549   LDLCALC 130* 09/05/2013 0549   HgbA1C  Lab Results  Component Value Date   HGBA1C 9.0* 09/05/2013     Urine Drug Screen:     Component Value Date/Time   LABOPIA NEGATIVE 09/05/2013 1822   LABOPIA NEG 08/26/2012 1251   COCAINSCRNUR NEGATIVE 09/05/2013 1822   LABBENZ NEGATIVE 09/05/2013 1822   AMPHETMU NEGATIVE 09/05/2013 1822    Alcohol Level: No results found for this basename: ETH,  in the last 168 hours  Mr Brain Wo Contrast 09/03/2013    1. Multiple small infarcts involving both cerebral hemispheres, corpus callosum, and medulla, consistent with embolic infarcts. These could be bland or septic emboli given history.  2. 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     CT Angiogram of the head and neck  09/05/2013 1. Occlusion of the intracranial right ICA. Multiple small collateral vessels in the region of the right ICA terminus is indicative of chronic occlusion. Right MCA is reconstituted new the MCA bifurcation, with slightly diminished distal flow compared to the left. Right ACA is mainly supplied via the anterior communicating artery. Right ICA is diffusely small in the neck, with moderate stenosis at its origin.  2. Moderate left carotid siphon atherosclerosis. Mild to moderate proximal right PCA stenosis. Mild to moderate attenuation of distal left PCA branch vessels.  3. 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  4. Dominant right vertebral artery without stenosis identified.  5. Small, acute bilateral infarcts as described on recent MRI.  Carotid Doppler  Preliminary report: Bilateral: 1-39% ICA stenosis. Vertebral artery flow is antegrade. Right: Proximal ICA waveform is abnormal (spiked with no diastolic component.) Mid and distal ICA waveform is within normal limits.  LE venous doppler There is no obvious evidence of DVT or SVT noted in the bilateral lower extremities.   2D Echocardiogram  Normal LV function; severe LVH; mild LAE. No source of embolus  TEE No LAA thrombus No PFO or ASD Normal LV function No valvular  disease or sign of endocarditis or abscess  CXR  08/31/2013 - Enteric tube is seen ending at the body of the stomach, with the sideport at the fundus of the stomach.  EKG - ST rate 107 beats per minute.  For complete results please see formal report.   Therapy Recommendations skilled nursing facility placement.  Physical Exam   Cardiac: HRR, tachycardic Neurologic Examination:  Mental Status:  Alert and oriented to person and place. Mild naming difficulties. Speech fluent without evidence of aphasia. Able to follow commands. Able to repeat. Cranial Nerves:  II-Visual fields were normal.  III/IV/VI-Pupils were equal and reactive, dysconjugate gaze. Extraocular movements were full with some left exotropia.  V/VII-mild right lower face weakness VIII-normal.  X-normal speech and symmetrical palatal movement.  Motor: RUE drift. 4+/5 bilaterally with normal tone and  bulk. Fine motor movements diminished on the right. Orbits left over right upper extremity Sensory: symmetrical throughout.  Plantars: Flexor bilaterally  Cerebellar: Normal finger-to-nose testing on the left. Mild dysmetria on the right.  ASSESSMENT [name] is a 38 y.o. female presenting with mental status changes. T-PA therapy was not initiated as the time of onset was not known and there were no objective focal deficits. 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 Need to rule out endocarditis in the setting of sepsis and infection. Blood and urine culture negative. She remains on Zosyn. She does have multiple other stroke risk factors including HTN, DM, HLD, morbid obesity. On no antithrombotics prior to admission. Stroke work up underway.  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 Ok to normalize BP from stroke standpoint, resume home meds as appropriate.   Morbid obesity, Body mass index is 60.35 kg/(m^2).   Hospital day # 8  TREATMENT/PLAN  Continue aspirin 325 mg orally every day for secondary stroke prevention.  Given her young age, will check hypercoagulable tests (lupus anticoagulant, cardiolipin antibody) and vasculitic labs (C3, C4, CH50, ANA, ESR) as well as HIV and RPR as possible stroke sources.  Risk factor modification  - Needs diabetic control.  Dispo: skilled nursing facility placement.  No further stroke workup indicated.  Patient has a 10-15% risk of having another stroke over the next year, the highest risk is within 2 weeks of the most recent stroke/TIA (risk of having a stroke following a stroke or TIA is the same).  Ongoing risk factor control by Primary Care Physician or SNF MD  Stroke Service will sign off. Will follow up labs as an OP. Please call should any needs arise.  Follow up with [name], [facility], in 2 months.  Burnetta Sabin, MSN, RN, ANVP-BC, ANP-BC, [facility] Pager: [phone] 09/07/2013 10:27 AM I have personally examined this patient, reviewed pertinent data and imaging studies, the plan of care and agree with the above SIGNED Antony Contras, MD  To contact Stroke Continuity provider, please refer to [email or web address] After hours, contact General Neurology

## 2013-09-07 NOTE — Progress Notes (Signed)
UR complete.  Elener Custodio RN, MSN 

## 2013-09-07 NOTE — H&P (Signed)
     INTERVAL PROCEDURE H&P  History and Physical Interval Note:  09/07/2013 8:35 AM  [name] has presented today for their planned procedure. The various methods of treatment have been discussed with the patient and family. After consideration of risks, benefits and other options for treatment, the patient has consented to the procedure.  The patients' outpatient history has been reviewed, patient examined, and no change in status from most recent office note within the past 30 days. I have reviewed the patients' chart and labs and will proceed as planned. Questions were answered to the patient's satisfaction.   Linda NoseKenneth C. Mckynzi Cammon, MD, [facility] Valley Regional Medical CenterFACC Attending Cardiologist [facility]  Zoee Heeney C 09/07/2013, 8:35 AM

## 2013-09-08 ENCOUNTER — Encounter (HOSPITAL_COMMUNITY): Payer: Self-pay | Admitting: Internal Medicine

## 2013-09-08 LAB — CBC
HCT: 27.1 % — ABNORMAL LOW (ref 36.0–46.0)
HEMOGLOBIN: 8.8 g/dL — AB (ref 12.0–15.0)
MCH: 26.7 pg (ref 26.0–34.0)
MCHC: 32.5 g/dL (ref 30.0–36.0)
MCV: 82.1 fL (ref 78.0–100.0)
Platelets: 427 10*3/uL — ABNORMAL HIGH (ref 150–400)
RBC: 3.3 MIL/uL — ABNORMAL LOW (ref 3.87–5.11)
RDW: 14.6 % (ref 11.5–15.5)
WBC: 12.7 10*3/uL — AB (ref 4.0–10.5)

## 2013-09-08 LAB — BASIC METABOLIC PANEL
Anion gap: 14 (ref 5–15)
BUN: 19 mg/dL (ref 6–23)
CO2: 20 mEq/L (ref 19–32)
Calcium: 8 mg/dL — ABNORMAL LOW (ref 8.4–10.5)
Chloride: 104 mEq/L (ref 96–112)
Creatinine, Ser: 1.27 mg/dL — ABNORMAL HIGH (ref 0.50–1.10)
GFR calc non Af Amer: 54 mL/min — ABNORMAL LOW (ref >90)
GFR, EST AFRICAN AMERICAN: 62 mL/min — AB (ref >90)
Glucose, Bld: 114 mg/dL — ABNORMAL HIGH (ref 70–99)
POTASSIUM: 3.9 meq/L (ref 3.7–5.3)
SODIUM: 138 meq/L (ref 137–147)

## 2013-09-08 LAB — GLUCOSE, CAPILLARY
GLUCOSE-CAPILLARY: 108 mg/dL — AB (ref 70–99)
GLUCOSE-CAPILLARY: 148 mg/dL — AB (ref 70–99)
GLUCOSE-CAPILLARY: 113 mg/dL — AB (ref 70–99)
GLUCOSE-CAPILLARY: 91 mg/dL (ref 70–99)

## 2013-09-08 LAB — CARDIOLIPIN ANTIBODIES, IGG, IGM, IGA
Anticardiolipin IgA: 7 APL U/mL — ABNORMAL LOW (ref <22)
Anticardiolipin IgG: 7 GPL U/mL — ABNORMAL LOW (ref <23)
Anticardiolipin IgM: 8 MPL U/mL — ABNORMAL LOW (ref <11)

## 2013-09-08 LAB — C3 COMPLEMENT: C3 Complement: 139 mg/dL (ref 90–180)

## 2013-09-08 LAB — ANA: Anti Nuclear Antibody(ANA): NEGATIVE

## 2013-09-08 LAB — CLOSTRIDIUM DIFFICILE BY PCR: Toxigenic C. Difficile by PCR: NEGATIVE

## 2013-09-08 LAB — C4 COMPLEMENT: COMPLEMENT C4, BODY FLUID: 39 mg/dL (ref 10–40)

## 2013-09-08 MED ORDER — ADULT MULTIVITAMIN W/MINERALS CH
1.0000 | ORAL_TABLET | Freq: Every day | ORAL | Status: DC
  Administered 2013-09-08 – 2013-09-13 (×5): 1 via ORAL
  Filled 2013-09-08 (×5): qty 1

## 2013-09-08 NOTE — Progress Notes (Signed)
[facility] Progress Note  1 Day Post-Op  Subjective: Pt's pain well controlled, tolerating diet.  Having lots of BM's which are soiling her wound thus a rectal tube was inserted.  She's tolerating the tube.  Husband at bedside.  Patient wants to go to a SNF in .    Objective: Vital signs in last 24 hours: Temp:  [97.6 F (36.4 C)-98.8 F (37.1 C)] 97.6 F (36.4 C) (07/29 0946) Pulse Rate:  [94-108] 107 (07/29 0946) Resp:  [18-22] 18 (07/29 0946) BP: (150-179)/(78-99) 179/93 mmHg (07/29 0946) SpO2:  [94 %-100 %] 100 % (07/29 0946) Last BM Date: 09/07/13  Intake/Output from previous day: 07/28 0701 - 07/29 0700 In: -  Out: 450 [Urine:450] Intake/Output this shift:    PE: Gen:  Alert, NAD, pleasant Large left thigh wound - mostly clean, some fat visible, minimal bleeding, smell of Dakins apparent, no pseudomonas greenish staining present, no foul smell, no purulent drainage, no necrotic areas requiring additional surgical intervention  Lab Results:   Recent Labs  09/07/13 0525 09/08/13 0600  WBC 13.2* 12.7*  HGB 8.6* 8.8*  HCT 26.8* 27.1*  PLT 394 427*   BMET  Recent Labs  09/07/13 0525 09/08/13 0600  NA 137 138  K 4.0 3.9  CL 105 104  CO2 21 20  GLUCOSE 100* 114*  BUN 20 19  CREATININE 1.25* 1.27*  CALCIUM 7.8* 8.0*   PT/INR No results found for this basename: LABPROT, INR,  in the last 72 hours CMP     Component Value Date/Time   NA 138 09/08/2013 0600   K 3.9 09/08/2013 0600   CL 104 09/08/2013 0600   CO2 20 09/08/2013 0600   GLUCOSE 114* 09/08/2013 0600   BUN 19 09/08/2013 0600   CREATININE 1.27* 09/08/2013 0600   CALCIUM 8.0* 09/08/2013 0600   PROT 6.9 08/31/2013 0220   ALBUMIN 1.5* 08/31/2013 0220   AST 27 08/31/2013 0220   ALT 19 08/31/2013 0220   ALKPHOS 154* 08/31/2013 0220   BILITOT 0.2* 08/31/2013 0220   GFRNONAA 54* 09/08/2013 0600   GFRAA 62* 09/08/2013 0600   Lipase  No results found for this basename: lipase        Studies/Results: No results found.  Anti-infectives: Anti-infectives   Start     Dose/Rate Route Frequency Ordered Stop   09/07/13 0100  vancomycin (VANCOCIN) 1,500 mg in sodium chloride 0.9 % 500 mL IVPB     1,500 mg 250 mL/hr over 120 Minutes Intravenous Every 12 hours 09/06/13 1144     09/06/13 1145  vancomycin (VANCOCIN) 2,500 mg in sodium chloride 0.9 % 500 mL IVPB     2,500 mg 250 mL/hr over 120 Minutes Intravenous  Once 09/06/13 1144 09/06/13 1646   09/02/13 0800  vancomycin (VANCOCIN) 1,250 mg in sodium chloride 0.9 % 250 mL IVPB  Status:  Discontinued     1,250 mg 166.7 mL/hr over 90 Minutes Intravenous Every 12 hours 09/01/13 1252 09/03/13 1007   09/01/13 1100  vancomycin (VANCOCIN) 1,750 mg in sodium chloride 0.9 % 500 mL IVPB  Status:  Discontinued     1,750 mg 250 mL/hr over 120 Minutes Intravenous Every 24 hours 08/31/13 1418 09/01/13 1252   08/31/13 2000  piperacillin-tazobactam (ZOSYN) IVPB 3.375 g     3.375 g 12.5 mL/hr over 240 Minutes Intravenous Every 8 hours 08/31/13 1026     08/31/13 1200  vancomycin (VANCOCIN) 1,750 mg in sodium chloride 0.9 % 500 mL IVPB  1,750 mg 250 mL/hr over 120 Minutes Intravenous  Once 08/31/13 1036 08/31/13 1303   08/31/13 1100  piperacillin-tazobactam (ZOSYN) IVPB 3.375 g     3.375 g 12.5 mL/hr over 240 Minutes Intravenous  Once 08/31/13 1026 08/31/13 1504   08/30/13 1515  [MAR Hold]  vancomycin (VANCOCIN) IVPB 1000 mg/200 mL premix     (On MAR Hold since 08/30/13 1616)   1,000 mg 200 mL/hr over 60 Minutes Intravenous  Once 08/30/13 1510 08/30/13 1647   08/30/13 1515  piperacillin-tazobactam (ZOSYN) IVPB 3.375 g     3.375 g 100 mL/hr over 30 Minutes Intravenous  Once 08/30/13 1510 08/30/13 1601       Assessment/Plan POD #9 sy/o I&D left thigh abscess/necrotic wound  Plan: 1.  Wound is mostly clean, continue Dakins solution for one additional day then discontinue and start NS soaked gauze tomorrow 2.  IVF, pain  control, antibiotics (Vanc Day 3, Zosyn Day 10) 3.  Await WBC to normalize prior to switching to oral antibiotics  4.  Wound culture shows gram negative, gram positive cocci in pairs and clusters, gram positive rods, No growth to date on culture 5.  PT/OT/SLP - recommending SNF 6.  Home when medically stable for transfer to SNF     LOS: 9 days    [place], [facility] 09/08/2013, 10:24 AM Pager: [phone]

## 2013-09-08 NOTE — Care Management Note (Addendum)
  Page 2 of 2   09/13/2013     11:22:36 AM CARE MANAGEMENT NOTE 09/13/2013  Patient:  [name], [name]   Account Number:  [id number]  Date Initiated:  08/30/2013  Documentation initiated by:  [facility]  Subjective/Objective Assessment:   Admitted to ICU post op I&D of thigh on vent.     Action/Plan:   Anticipated DC Date:  09/06/2013   Anticipated DC Plan:  [place]  CM consult      Choice offered to / List presented to:             Status of service:  Completed, signed off Medicare Important Message given?   (If response is "NO", the following Medicare IM given date fields will be blank) Date Medicare IM given:   Medicare IM given by:   Date Additional Medicare IM given:   Additional Medicare IM given by:    Discharge Disposition:  [place]  Per UR Regulation:  Reviewed for med. necessity/level of care/duration of stay  If discussed at [place] of Stay Meetings, dates discussed:    Comments:  Contact:  [name],Ricardo Spouse [phone]   [phone]   09/13/13 [place], MSN, CM- Met with patient and husband to verify that they are willing to accept the bed offer at [facility] for discharge today.  Patient and spouse are both agreeable.  Dr Ree Kida, [place] and patient's RN were all updated.  09/09/13 [place], MSN, CM- Patient was set for SNF placement this morning, but refused once [facility] arrived to transport to facility.  After refusing bed, patient was set for discharge home AMA.  Patient then reconsidered and requested SNF placement.  Per CSW, bed was no longer available.  CM and unit director met with patient and husband to discuss final discharge disposition. Patient and husband both state that they intend to discharge to SNF and are aware that they may not be able to get a SNF in their desired geographical area.  Patient and husband verbalized understanding of this.   CSW will continue to search for bed offers.  Dr Ree Kida has been updated.  09/08/13 [place], MSN, CM- Recieved message from [place] that patient and husband have declined SNF because the bed offer was not made in the [place] area. They are requesting home health services. CM spoke with patient and husband at length regarding what home health services entail.  Once discussing that 24 hr care is not available through home health and that home health cannot do every BID dressing change and hang all IV ATB doses. Patient and husband had concerns regarding transportation home at discharge from SNF.  Per conversation with CSW, CM informed patient and husband that a CSW at the SNF would be able to assist with transportation needs at discharge.  They are now agreeable to SNF and are aware that the plan is for discharge home tomorrow. Dr Ree Kida and CSW are aware.  RN updated.

## 2013-09-08 NOTE — Progress Notes (Addendum)
[facility]                                                                              Patient Demographics  [name], is a 38 y.o. female, DOB - Jul 03, 1975, [id number]  Admit date - 08/30/2013   Admitting Physician Elsie Stain, MD  Outpatient Primary MD for the patient is Barbette Merino, MD  LOS - 9   Chief Complaint  Patient presents with  . Wound Infection  . Fatigue  . Fever     HPI on 08/30/2013 38 yo female developed boil in left thigh one week ago. Presented with fever (Tm 102F), lethargy. Found to have Left leg abscess complicated by sepsis, hyperglycemia. Was taken to OR for I&D and remained on vent post op. She has hx of DM, HTN, asthma.  Interim history Patient had become lethargic after extubation. MRI of the brain was obtained showing embolic infarcts. Neurology was consulted TEE was found to be unremarkable with no vegetations. Neurology recommended a hypercoagulation panel as well as continuing aspirin. Patient has been on vancomycin and Zosyn for the thigh wound, which surgery is following.  Assessment & Plan   Multiple embolic infarcts -Found on MRI of the brain -CTA head and neck results are noted below, RICA with signs of chronic occlusion -Echocardiogram is unremarkable, TEE showed no vegetations. -Neurology was consulted and signed off. -Recommend continuing aspirin and attending hypercoagulation workup. -Cardiology was consulted for TEE, no signs of endocarditis -PT and OT were consulted and recommended nursing facility.  Left thigh wound status post surgical drainage and debridement of abscess -Blood cultures remain negative to date -Urine culture negative -Wound culture shows no growth, Gram stain shows abundant gram-negative rods and moderate gram-positive cocci in pairs -Continue vancomycin and Zosyn -Surgery currently following and recommends patient be discharged with vancomycin by mouth for 7 days.  Sepsis secondary to left  thigh abscess -Resolved  Acute encephalopathy secondary to sepsis versus CVA -Improving, patient appears stable.  Acute respiratory failure secondary to sepsis with history of asthma -Resolved. Patient did require intubation during this hospitalization -Continue Pulmicort, PRN albuterol,brovana  Acute kidney injury -Likely secondary to sepsis and acute infarction -Improved, will continue to monitor  Hyponatremia -Resolved, patient was given IVF  Leukocytosis  -likely secondary to sepsis versus acute CVA -Improving and trending downward  Microcytic anemia -Baseline hemoglobin appears to be 13-14  Diabetes mellitus type 2 -Last HbA1c 15.4 -CBGs remain persistently high -Continue insulin sliding scale, resistant along with Lantus and CBG monitoring  High triglycerides -Triglycerides 550 -Continue gemfibrozil  Diarrhea -Patient had multiple episodes of diarrhea overnight and required flexiseal -Will obtain C. difficile PCR -possibly secondary to patient's antibiotics  Code Status: Full  Family Communication: Family at bedside  Disposition Plan: Admitted, pending placement  Time Spent in minutes   30 minutes  Procedures  I&D of left thigh abscess  TEE No LAA thrombus No PFO or ASD Normal LV function No valvular disease or sign of endocarditis or abscess  2D Echocardiogram Normal LV function; severe LVH; mild LAE. No source of embolus  CTA Angiogram of the head and neck  09/05/2013  1. Occlusion of the intracranial right ICA. Multiple  small collateral vessels in the region of the right ICA terminus is indicative of chronic occlusion. Right MCA is reconstituted new the MCA bifurcation, with slightly diminished distal flow compared to the left. Right ACA is mainly supplied via the anterior communicating artery. Right ICA is diffusely small in the neck, with moderate stenosis at its origin.  2. Moderate left carotid siphon atherosclerosis. Mild to moderate proximal right  PCA stenosis. Mild to moderate attenuation of distal left PCA branch vessels.  3. 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  4. Dominant right vertebral artery without stenosis identified.  5. Small, acute bilateral infarcts as described on recent MRI.  Consults   Neurology Surgery  DVT Prophylaxis  SCDs  Lab Results  Component Value Date   PLT 427* 09/08/2013    Medications  Scheduled Meds: .  stroke: mapping our early stages of recovery book   Does not apply Once  . antiseptic oral rinse  15 mL Mouth Rinse QID  . arformoterol  15 mcg Nebulization Q12H  . aspirin EC  325 mg Oral Daily  . atorvastatin  20 mg Oral q1800  . budesonide (PULMICORT) nebulizer solution  0.25 mg Nebulization Q12H  . chlorhexidine  15 mL Mouth Rinse BID  . feeding supplement (PRO-STAT SUGAR FREE 64)  30 mL Oral BID WC  . gemfibrozil  600 mg Oral BID AC  . heparin  5,000 Units Subcutaneous 3 times per day  . insulin aspart  0-20 Units Subcutaneous TID WC  . insulin aspart  6 Units Subcutaneous TID WC  . insulin glargine  22 Units Subcutaneous Daily  . insulin starter kit- syringes  1 kit Other Once  . piperacillin-tazobactam (ZOSYN)  IV  3.375 g Intravenous Q8H  . sodium hypochlorite   Irrigation BID  . vancomycin  1,500 mg Intravenous Q12H   Continuous Infusions: . sodium chloride 10 mL/hr at 09/04/13 1734   PRN Meds:.acetaminophen, acetaminophen, albuterol, hydrALAZINE, sodium chloride, sodium chloride  Antibiotics    Anti-infectives   Start     Dose/Rate Route Frequency Ordered Stop   09/07/13 0100  vancomycin (VANCOCIN) 1,500 mg in sodium chloride 0.9 % 500 mL IVPB     1,500 mg 250 mL/hr over 120 Minutes Intravenous Every 12 hours 09/06/13 1144     09/06/13 1145  vancomycin (VANCOCIN) 2,500 mg in sodium chloride 0.9 % 500 mL IVPB     2,500 mg 250 mL/hr over 120 Minutes Intravenous  Once 09/06/13 1144 09/06/13 1646   09/02/13  0800  vancomycin (VANCOCIN) 1,250 mg in sodium chloride 0.9 % 250 mL IVPB  Status:  Discontinued     1,250 mg 166.7 mL/hr over 90 Minutes Intravenous Every 12 hours 09/01/13 1252 09/03/13 1007   09/01/13 1100  vancomycin (VANCOCIN) 1,750 mg in sodium chloride 0.9 % 500 mL IVPB  Status:  Discontinued     1,750 mg 250 mL/hr over 120 Minutes Intravenous Every 24 hours 08/31/13 1418 09/01/13 1252   08/31/13 2000  piperacillin-tazobactam (ZOSYN) IVPB 3.375 g     3.375 g 12.5 mL/hr over 240 Minutes Intravenous Every 8 hours 08/31/13 1026     08/31/13 1200  vancomycin (VANCOCIN) 1,750 mg in sodium chloride 0.9 % 500 mL IVPB     1,750 mg 250 mL/hr over 120 Minutes Intravenous  Once 08/31/13 1036 08/31/13 1303   08/31/13 1100  piperacillin-tazobactam (ZOSYN) IVPB 3.375 g     3.375 g 12.5 mL/hr over 240 Minutes Intravenous  Once 08/31/13 1026 08/31/13 1504   08/30/13 1515  [MAR Hold]  vancomycin (VANCOCIN) IVPB 1000 mg/200 mL premix     (On MAR Hold since 08/30/13 1616)   1,000 mg 200 mL/hr over 60 Minutes Intravenous  Once 08/30/13 1510 08/30/13 1647   08/30/13 1515  piperacillin-tazobactam (ZOSYN) IVPB 3.375 g     3.375 g 100 mL/hr over 30 Minutes Intravenous  Once 08/30/13 1510 08/30/13 1601        Subjective:   [name] seen and examined today. Patient has no complaints this morning. She is either to go home. It appears the patient episodes of diarrhea overnight. Patient denies shortness of breath, chest pain, abdominal pain at this time.  Objective:   Filed Vitals:   09/08/13 0146 09/08/13 0650 09/08/13 0745 09/08/13 0946  BP: 160/90 164/82  179/93  Pulse: 108 95  107  Temp: 98.7 F (37.1 C)   97.6 F (36.4 C)  TempSrc: Oral   Oral  Resp: $Remo'18 18  18  'evtSL$ Height:      Weight:      SpO2: 94% 100% 100% 100%    Wt Readings from Last 3 Encounters:  09/02/13 144.8 kg (319 lb 3.6 oz)  09/02/13 144.8 kg (319 lb 3.6 oz)  09/02/13 144.8 kg (319 lb 3.6 oz)     Intake/Output  Summary (Last 24 hours) at 09/08/13 1246 Last data filed at 09/07/13 1844  Gross per 24 hour  Intake      0 ml  Output    450 ml  Net   -450 ml    Exam  General: Well developed, well nourished, NAD, appears stated age  HEENT: NCAT, PERRLA, EOMI, Anicteic Sclera, mucous membranes moist.   Neck: Supple, no JVD, no masses  Cardiovascular: S1 S2 auscultated, no rubs, murmurs or gallops. Regular rate and rhythm.  Respiratory: Clear to auscultation bilaterally with equal chest rise  Abdomen: Soft, obese, nontender, nondistended, + bowel sounds  Extremities: warm dry without cyanosis clubbing or edema. Left thigh wound currently covered.  Neuro: Awake however very sleepy at this time. No focal deficits. All 4 extremities.  Data Review   Micro Results Recent Results (from the past 240 hour(s))  CULTURE, BLOOD (ROUTINE X 2)     Status: None   Collection Time    08/30/13  2:29 PM      Result Value Ref Range Status   Specimen Description BLOOD LEFT ANTECUBITAL   Final   Special Requests     Final   Value: BOTTLES DRAWN AEROBIC AND ANAEROBIC 6CC AER 5CC ANA   Culture  Setup Time     Final   Value: 08/30/2013 19:36     Performed at [facility]   Culture     Final   Value: NO GROWTH 5 DAYS     Performed at [facility]   Report Status 09/05/2013 FINAL   Final  CULTURE, BLOOD (ROUTINE X 2)     Status: None   Collection Time    08/30/13  2:41 PM      Result Value Ref Range Status   Specimen Description BLOOD LEFT HAND   Final   Special Requests BOTTLES DRAWN AEROBIC AND ANAEROBIC [facility]   Final   Culture  Setup Time     Final   Value: 08/30/2013 19:36     Performed at [facility]   Culture     Final   Value: STAPHYLOCOCCUS SPECIES (COAGULASE NEGATIVE)  Note: THE SIGNIFICANCE OF ISOLATING THIS ORGANISM FROM A SINGLE SET OF BLOOD CULTURES WHEN MULTIPLE SETS ARE DRAWN IS UNCERTAIN. PLEASE NOTIFY THE MICROBIOLOGY DEPARTMENT WITHIN ONE WEEK IF SPECIATION  AND SENSITIVITIES ARE REQUIRED.     Note: Gram Stain Report Called to,Read Back By and Verified With: SARA [email or web address]$ :54PM ON 08/31/13 BY DANTS     Performed at [facility]   Report Status 09/01/2013 FINAL   Final  WOUND CULTURE     Status: None   Collection Time    08/30/13  5:06 PM      Result Value Ref Range Status   Specimen Description WOUND THIGH LEFT   Final   Special Requests PATIENT ON FOLLOWING VANCOMYCIN, ZOSYN   Final   Gram Stain     Final   Value: RARE WBC PRESENT, PREDOMINANTLY MONONUCLEAR     NO SQUAMOUS EPITHELIAL CELLS SEEN     ABUNDANT GRAM NEGATIVE RODS     FEW GRAM POSITIVE COCCI IN PAIRS     IN CLUSTERS FEW GRAM POSITIVE RODS   Culture     Final   Value: NO GROWTH 2 DAYS     Performed at [facility]   Report Status 09/02/2013 FINAL   Final  ANAEROBIC CULTURE     Status: None   Collection Time    08/30/13  5:06 PM      Result Value Ref Range Status   Specimen Description WOUND THIGH LEFT   Final   Special Requests PATIENT ON FOLLOWING VANCOMYCIN, ZOSYN   Final   Gram Stain     Final   Value: RARE WBC PRESENT, PREDOMINANTLY MONONUCLEAR     NO SQUAMOUS EPITHELIAL CELLS SEEN     ABUNDANT GRAM NEGATIVE RODS     FEW GRAM POSITIVE COCCI IN PAIRS     IN CLUSTERS FEW GRAM POSITIVE RODS   Culture     Final   Value: ABUNDANT BACTEROIDES UNIFORMIS     Note: BETA LACTAMASE POSITIVE     Performed at [facility]   Report Status 09/06/2013 FINAL   Final  MRSA PCR SCREENING     Status: None   Collection Time    08/30/13  7:11 PM      Result Value Ref Range Status   MRSA by PCR NEGATIVE  NEGATIVE Final   Comment:            The GeneXpert MRSA Assay (FDA     approved for NASAL specimens     only), is one component of a     comprehensive MRSA colonization     surveillance program. It is not     intended to diagnose MRSA     infection nor to guide or     monitor treatment for     MRSA infections.  URINE CULTURE     Status: None   Collection  Time    08/30/13  7:12 PM      Result Value Ref Range Status   Specimen Description URINE, CATHETERIZED   Final   Special Requests NONE   Final   Culture  Setup Time     Final   Value: 08/31/2013 00:49     Performed at [place]     Final   Value: NO GROWTH     Performed at [facility]   Culture     Final   Value: NO GROWTH     Performed at [facility]   Report Status 09/01/2013 FINAL   Final  CULTURE, BLOOD (ROUTINE X 2)     Status: None   Collection Time    09/01/13 11:10 AM      Result Value Ref Range Status   Specimen Description BLOOD LEFT ANTECUBITAL   Final   Special Requests BOTTLES DRAWN AEROBIC ONLY [facility]   Final   Culture  Setup Time     Final   Value: 09/01/2013 14:01     Performed at [facility]   Culture     Final   Value: NO GROWTH 5 DAYS     Performed at [facility]   Report Status 09/07/2013 FINAL   Final  CULTURE, BLOOD (ROUTINE X 2)     Status: None   Collection Time    09/01/13 11:20 AM      Result Value Ref Range Status   Specimen Description BLOOD LEFT HAND   Final   Special Requests BOTTLES DRAWN AEROBIC ONLY [facility]   Final   Culture  Setup Time     Final   Value: 09/01/2013 14:01     Performed at [facility]   Culture     Final   Value: NO GROWTH 5 DAYS     Performed at [facility]   Report Status 09/07/2013 FINAL   Final  CULTURE, BLOOD (ROUTINE X 2)     Status: None   Collection Time    09/06/13  2:20 PM      Result Value Ref Range Status   Specimen Description BLOOD RIGHT THUMB   Final   Special Requests BOTTLES DRAWN AEROBIC AND ANAEROBIC 10CC   Final   Culture  Setup Time     Final   Value: 09/06/2013 19:16     Performed at [facility]   Culture     Final   Value:        BLOOD CULTURE RECEIVED NO GROWTH TO DATE CULTURE WILL BE HELD FOR 5 DAYS BEFORE ISSUING A FINAL NEGATIVE REPORT     Performed at [facility]   Report Status PENDING    Incomplete  CULTURE, BLOOD (ROUTINE X 2)     Status: None   Collection Time    09/06/13  2:35 PM      Result Value Ref Range Status   Specimen Description BLOOD RIGHT HAND   Final   Special Requests BOTTLES DRAWN AEROBIC AND ANAEROBIC [facility]   Final   Culture  Setup Time     Final   Value: 09/06/2013 19:16     Performed at [facility]   Culture     Final   Value:        BLOOD CULTURE RECEIVED NO GROWTH TO DATE CULTURE WILL BE HELD FOR 5 DAYS BEFORE ISSUING A FINAL NEGATIVE REPORT     Performed at [facility]   Report Status PENDING   Incomplete  CLOSTRIDIUM DIFFICILE BY PCR     Status: None   Collection Time    09/08/13 11:25 AM      Result Value Ref Range Status   C difficile by pcr NEGATIVE  NEGATIVE Final    Radiology Reports Ct Angio Head W/cm &/or Wo Cm  09/05/2013   CLINICAL DATA:  Diabetes, hypertension, and prior stroke with left lower extremity abscess and sepsis. Multiple, likely embolic infarcts on MRI. Stroke workup.  EXAM: CT ANGIOGRAPHY HEAD AND NECK  TECHNIQUE: Multidetector CT imaging of  the head and neck was performed using the standard protocol during bolus administration of intravenous contrast. Multiplanar CT image reconstructions and MIPs were obtained to evaluate the vascular anatomy. Carotid stenosis measurements (when applicable) are obtained utilizing NASCET criteria, using the distal internal carotid diameter as the denominator.  CONTRAST:  12mL OMNIPAQUE IOHEXOL 350 MG/ML SOLN  COMPARISON:  Brain MRI 09/03/2013  FINDINGS: CTA HEAD FINDINGS  Small foci of hypoattenuation are seen in the anterior body and genu of the corpus callosum, right globus pallidus, and left posterior left temporal periventricular white matter corresponding to acute infarcts described on recent MRI additional, punctate acute infarcts on the MRI are not well demonstrated on the CT. Remote infarcts are noted in both cerebellar hemispheres and in the left basal ganglia/ corona  radiata. There is no evidence of acute large territory cortical infarct, intracranial hemorrhage, midline shift, or extra-axial fluid collection. There is mild ex vacuo dilatation of the body of the left lateral ventricle. No abnormal enhancement is identified. Orbits are unremarkable. Mastoid air cells and paranasal sinuses are clear.  The intracranial right vertebral artery is patent, dominant, and supplies the basilar. The left intracranial vertebral artery is markedly small in caliber and only intermittently patent. PICA origins appear patent. AICA and SCA origins are patent. Basilar artery is mildly irregular but without stenosis. There is a focal, mild-to-moderate distal right P1 stenosis. There is irregular, mild to moderate attenuation of left P2 and P3 branches. Posterior communicating arteries are not clearly identified.  The proximal right intracranial ICA is diffusely small in caliber with a severe stenosis at the petrous-cavernous junction. The right ICA becomes occluded at the anterior cavernous segment. There is minimal to no reconstitution of the right intracranial ICA distal to this. Multiple collateral vessels are seen in the expected region of the right ICA terminus. These collateral vessels reconstitute the right MCA near the MCA bifurcation. Major right M2 branches appear patent although mildly smaller in caliber than those on the left. A small amount of contrast is present in the right A1 segment, with the A2 being supplied predominantly by the left ACA via the anterior communicating artery, which appears somewhat bulbous.  The left internal carotid artery is patent from skullbase to carotid terminus. There is mild calcification and moderate irregularity of the left carotid siphon. There is mild narrowing of the left ICA at the petrous-cavernous junction. There is diffuse irregularity with mild to moderate tandem stenoses involving the anterior genu of the left cavernous carotid as well as the  supraclinoid left ICA. There is mild stenosis of the proximal left A1 segment. There is also mild proximal left M1 stenosis. There is mild, diffuse irregularity of left M2 and more distal branches.  Review of the MIP images confirms the above findings.  CTA NECK FINDINGS  Three vessel aortic arch. Subclavian arteries are grossly unremarkable. Evaluation of the proximal common carotid arteries is limited by image noise due to shoulder attenuation as well as mild motion artifact. No mid or distal common carotid artery stenosis is seen. There is moderate focal narrowing of the right internal carotid artery at its origin. The right ICA obtains a more normal caliber for approximately 1 cm beyond the stenosis before becoming narrowed again and demonstrating a diffusely narrowed but smooth luminal contour for the remainder of its course in the neck. The right ICA lumen near its origin at the site of proximal stenosis measures approximately 2.3 mm in diameter, with the diffusely narrowed mid to distal right  cervical ICA measuring approximately 2.7 mm in diameter. The left cervical ICA is mildly tortuous but patent and without stenosis.  The proximal right vertebral artery is grossly patent but suboptimally evaluated due to artifact. The V2 and V3 segments of the right vertebral artery are normal in appearance. The left vertebral artery appears occluded at its origin with reconstitution at the C4 level, with the lumen of the vertebral artery being markedly small in caliber distal to this.  A left subclavian approach central venous catheter is partially visualized. Jugulodigastric lymph nodes measure up to 10 mm in short axis.  Review of the MIP images confirms the above findings.  IMPRESSION: 1. Occlusion of the intracranial right ICA. Multiple small collateral vessels in the region of the right ICA terminus is indicative of chronic occlusion. Right MCA is reconstituted new the MCA bifurcation, with slightly diminished  distal flow compared to the left. Right ACA is mainly supplied via the anterior communicating artery. Right ICA is diffusely small in the neck, with moderate stenosis at its origin. 2. Moderate left carotid siphon atherosclerosis. Mild to moderate proximal right PCA stenosis. Mild to moderate attenuation of distal left PCA branch vessels. 3. 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 4. Dominant right vertebral artery without stenosis identified. 5. Small, acute bilateral infarcts as described on recent MRI.   Electronically Signed   By: Sebastian Ache   On: 09/05/2013 20:48   Ct Angio Neck W/cm &/or Wo/cm  09/05/2013   CLINICAL DATA:  Diabetes, hypertension, and prior stroke with left lower extremity abscess and sepsis. Multiple, likely embolic infarcts on MRI. Stroke workup.  EXAM: CT ANGIOGRAPHY HEAD AND NECK  TECHNIQUE: Multidetector CT imaging of the head and neck was performed using the standard protocol during bolus administration of intravenous contrast. Multiplanar CT image reconstructions and MIPs were obtained to evaluate the vascular anatomy. Carotid stenosis measurements (when applicable) are obtained utilizing NASCET criteria, using the distal internal carotid diameter as the denominator.  CONTRAST:  73mL OMNIPAQUE IOHEXOL 350 MG/ML SOLN  COMPARISON:  Brain MRI 09/03/2013  FINDINGS: CTA HEAD FINDINGS  Small foci of hypoattenuation are seen in the anterior body and genu of the corpus callosum, right globus pallidus, and left posterior left temporal periventricular white matter corresponding to acute infarcts described on recent MRI additional, punctate acute infarcts on the MRI are not well demonstrated on the CT. Remote infarcts are noted in both cerebellar hemispheres and in the left basal ganglia/ corona radiata. There is no evidence of acute large territory cortical infarct, intracranial hemorrhage, midline shift, or extra-axial fluid  collection. There is mild ex vacuo dilatation of the body of the left lateral ventricle. No abnormal enhancement is identified. Orbits are unremarkable. Mastoid air cells and paranasal sinuses are clear.  The intracranial right vertebral artery is patent, dominant, and supplies the basilar. The left intracranial vertebral artery is markedly small in caliber and only intermittently patent. PICA origins appear patent. AICA and SCA origins are patent. Basilar artery is mildly irregular but without stenosis. There is a focal, mild-to-moderate distal right P1 stenosis. There is irregular, mild to moderate attenuation of left P2 and P3 branches. Posterior communicating arteries are not clearly identified.  The proximal right intracranial ICA is diffusely small in caliber with a severe stenosis at the petrous-cavernous junction. The right ICA becomes occluded at the anterior cavernous segment. There is minimal to no reconstitution of the right intracranial ICA distal to this. Multiple collateral  vessels are seen in the expected region of the right ICA terminus. These collateral vessels reconstitute the right MCA near the MCA bifurcation. Major right M2 branches appear patent although mildly smaller in caliber than those on the left. A small amount of contrast is present in the right A1 segment, with the A2 being supplied predominantly by the left ACA via the anterior communicating artery, which appears somewhat bulbous.  The left internal carotid artery is patent from skullbase to carotid terminus. There is mild calcification and moderate irregularity of the left carotid siphon. There is mild narrowing of the left ICA at the petrous-cavernous junction. There is diffuse irregularity with mild to moderate tandem stenoses involving the anterior genu of the left cavernous carotid as well as the supraclinoid left ICA. There is mild stenosis of the proximal left A1 segment. There is also mild proximal left M1 stenosis. There is  mild, diffuse irregularity of left M2 and more distal branches.  Review of the MIP images confirms the above findings.  CTA NECK FINDINGS  Three vessel aortic arch. Subclavian arteries are grossly unremarkable. Evaluation of the proximal common carotid arteries is limited by image noise due to shoulder attenuation as well as mild motion artifact. No mid or distal common carotid artery stenosis is seen. There is moderate focal narrowing of the right internal carotid artery at its origin. The right ICA obtains a more normal caliber for approximately 1 cm beyond the stenosis before becoming narrowed again and demonstrating a diffusely narrowed but smooth luminal contour for the remainder of its course in the neck. The right ICA lumen near its origin at the site of proximal stenosis measures approximately 2.3 mm in diameter, with the diffusely narrowed mid to distal right cervical ICA measuring approximately 2.7 mm in diameter. The left cervical ICA is mildly tortuous but patent and without stenosis.  The proximal right vertebral artery is grossly patent but suboptimally evaluated due to artifact. The V2 and V3 segments of the right vertebral artery are normal in appearance. The left vertebral artery appears occluded at its origin with reconstitution at the C4 level, with the lumen of the vertebral artery being markedly small in caliber distal to this.  A left subclavian approach central venous catheter is partially visualized. Jugulodigastric lymph nodes measure up to 10 mm in short axis.  Review of the MIP images confirms the above findings.  IMPRESSION: 1. Occlusion of the intracranial right ICA. Multiple small collateral vessels in the region of the right ICA terminus is indicative of chronic occlusion. Right MCA is reconstituted new the MCA bifurcation, with slightly diminished distal flow compared to the left. Right ACA is mainly supplied via the anterior communicating artery. Right ICA is diffusely small in the  neck, with moderate stenosis at its origin. 2. Moderate left carotid siphon atherosclerosis. Mild to moderate proximal right PCA stenosis. Mild to moderate attenuation of distal left PCA branch vessels. 3. 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 4. Dominant right vertebral artery without stenosis identified. 5. Small, acute bilateral infarcts as described on recent MRI.   Electronically Signed   By: Logan Bores   On: 09/05/2013 20:48   Mr Brain Wo Contrast  09/03/2013   CLINICAL DATA:  Change in level of consciousness. Rule out stroke. Thigh abscess with sepsis.  EXAM: MRI HEAD WITHOUT CONTRAST  TECHNIQUE: Multiplanar, multiecho pulse sequences of the brain and surrounding structures were obtained without intravenous contrast.  COMPARISON:  None.  FINDINGS: Images are mildly to moderately degraded by motion. There are numerous punctate foci of restricted diffusion involving cortex and white matter of both cerebral hemispheres consistent with acute infarcts. Several slightly larger foci of acute infarction are present in the corpus callosum measuring up to approximately 1.3 cm in size. Small, acute infarcts are also present in the right greater than left basal ganglia, and there is a punctate acute infarct anteriorly in the right aspect of the medulla. There may be a small amount of hemorrhage associated with a few of these punctate infarcts. Old infarcts are present in the bilateral basal ganglia and bilateral cerebellum.  There is no mass, midline shift, or extra-axial fluid collection. There is mild cerebral and cerebellar atrophy. Orbits are unremarkable. Minimal ethmoid air cell mucosal thickening is noted. Mastoid air cells are clear.  IMPRESSION: 1. Multiple small infarcts involving both cerebral hemispheres, corpus callosum, and medulla, consistent with embolic infarcts. These could be bland or septic emboli given history. 2. 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   Electronically Signed   By: Logan Bores   On: 09/03/2013 17:38   Dg Chest Port 1 View  09/05/2013   CLINICAL DATA:  Left-sided PICC placement.  EXAM: PORTABLE CHEST - 1 VIEW  COMPARISON:  Chest x-ray 08/31/2013.  FINDINGS: There is a left upper extremity PICC with tip terminating in the superior aspect of the right atrium (approximately 3 cm beyond the superior cavoatrial junction). No pneumothorax. No acute consolidative airspace disease. No pleural effusions. No evidence of pulmonary edema. Mild cardiomegaly. Upper mediastinal contours are within normal limits.  IMPRESSION: 1. Tip of PICC is in the right atrium approximately 3 cm distal to the superior cavoatrial junction. 2. Status post extubation with significantly improved aeration. 3. Mild cardiomegaly.   Electronically Signed   By: Vinnie Langton M.D.   On: 09/05/2013 14:21   Portable Chest Xray In Am  08/31/2013   CLINICAL DATA:  Respiratory failure.  EXAM: PORTABLE CHEST - 1 VIEW  COMPARISON:  08/30/2013.  FINDINGS: The endotracheal tube is 4 cm above the carina. The NG tube is coursing down the esophagus and into the stomach. The heart is enlarged but unchanged. Persistent mild perihilar edema and probable small effusions.  IMPRESSION: Stable support apparatus.  Persistent perihilar edema and areas of atelectasis.   Electronically Signed   By: Kalman Jewels M.D.   On: 08/31/2013 07:29   Dg Chest Port 1 View  08/30/2013   CLINICAL DATA:  Endotracheal tube placement.  EXAM: PORTABLE CHEST - 1 VIEW  COMPARISON:  08/30/2013 and 02/21/2013.  FINDINGS: 2214 hr. Endotracheal tube appears unchanged, within the mid trachea. There is improved aeration of the lungs with residual patchy left greater than right basilar airspace opacities. There is no pleural effusion or pneumothorax. The heart size and mediastinal contours are stable.  IMPRESSION: Interval improved aeration of the lung bases. Endotracheal tube appears unchanged  within the midtrachea.   Electronically Signed   By: Camie Patience M.D.   On: 08/30/2013 23:16   Portable Chest Xray  08/30/2013   CLINICAL DATA:  Endotracheal tube placement  EXAM: PORTABLE CHEST - 1 VIEW  COMPARISON:  Chest radiograph 02/21/2013  FINDINGS: Markedly low lung volumes limit evaluation. The endotracheal tube terminates within the distal trachea approximately 5 mm superior to the carina. Cardiomegaly. Heterogeneous lower lung opacities, left greater than right. No pleural effusion or pneumothorax.  IMPRESSION: Endotracheal tube terminates within the distal trachea. Recommend retraction and re-evaluation with  repeat radiograph.  Scattered heterogeneous opacities favored to represent atelectasis in the setting of markedly low lung volumes.  Discussed with Nurse Reece Leader at 8:43 p.m. on 08/30/2013.   Electronically Signed   By: Lovey Newcomer M.D.   On: 08/30/2013 20:44   Dg Abd Portable 1v  08/31/2013   CLINICAL DATA:  Enteric tube placement.  EXAM: PORTABLE ABDOMEN - 1 VIEW  COMPARISON:  Lumbar spine radiographs performed 09/26/2011  FINDINGS: The patient's enteric tube is seen ending at the body of the stomach, with the sideport at the fundus of the stomach.  The visualized bowel gas pattern is grossly unremarkable, with scattered air noted in the colon. No free intra-abdominal air is identified, though evaluation for free air is limited on a single supine view and due to motion artifact. No acute osseous abnormalities are seen.  IMPRESSION: Enteric tube is seen ending at the body of the stomach, with the sideport at the fundus of the stomach.   Electronically Signed   By: Garald Balding M.D.   On: 08/31/2013 05:45    CBC  Recent Labs Lab 09/03/13 0500 09/04/13 0442 09/06/13 0930 09/07/13 0525 09/08/13 0600  WBC 17.0* 11.6* 14.6* 13.2* 12.7*  HGB 9.7* 11.6* 9.4* 8.6* 8.8*  HCT 29.4* 35.9* 28.5* 26.8* 27.1*  PLT 335 350 406* 394 427*  MCV 80.8 83.5 81.7 82.2 82.1  MCH 26.6 27.0  26.9 26.4 26.7  MCHC 33.0 32.3 33.0 32.1 32.5  RDW 14.5 14.8 14.3 14.3 14.6    Chemistries   Recent Labs Lab 09/04/13 0442 09/05/13 0549 09/06/13 0930 09/07/13 0525 09/08/13 0600  NA 137 138 134* 137 138  K 4.7 3.7 4.2 4.0 3.9  CL 103 96 100 105 104  CO2 15* $Remov'23 22 21 20  'HDOnXD$ GLUCOSE 164* 152* 235* 100* 114*  BUN 29* $Remov'11 23 20 19  'bSlGbs$ CREATININE 1.01 0.75 1.09 1.25* 1.27*  CALCIUM 8.2* 8.8 8.0* 7.8* 8.0*   ------------------------------------------------------------------------------------------------------------------ estimated creatinine clearance is 83.7 ml/min (by C-G formula based on Cr of 1.27). ------------------------------------------------------------------------------------------------------------------ No results found for this basename: HGBA1C,  in the last 72 hours ------------------------------------------------------------------------------------------------------------------ No results found for this basename: CHOL, HDL, LDLCALC, TRIG, CHOLHDL, LDLDIRECT,  in the last 72 hours ------------------------------------------------------------------------------------------------------------------ No results found for this basename: TSH, T4TOTAL, FREET3, T3FREE, THYROIDAB,  in the last 72 hours ------------------------------------------------------------------------------------------------------------------ No results found for this basename: VITAMINB12, FOLATE, FERRITIN, TIBC, IRON, RETICCTPCT,  in the last 72 hours  Coagulation profile No results found for this basename: INR, PROTIME,  in the last 168 hours  No results found for this basename: DDIMER,  in the last 72 hours  Cardiac Enzymes No results found for this basename: CK, CKMB, TROPONINI, MYOGLOBIN,  in the last 168 hours ------------------------------------------------------------------------------------------------------------------ No components found with this basename: POCBNP,     Facundo Allemand D.O. on  09/08/2013 at 12:46 PM  Between 7am to 7pm - Pager - [phone]  After 7pm go to [email or web address] - password [email or web address]  And look for the night coverage person covering for me after hours  [facility] Office  [phone]

## 2013-09-08 NOTE — Progress Notes (Signed)
NUTRITION FOLLOW UP  Intervention:   Continue 30 ml Pro-stat BID, each supplement provides 15 grams of protein Provide Multivitamin with minerals tomorrow RD to follow-up 7/30 to provide diet education   Nutrition Dx:   Increased nutrient needs related to wound healing as evidenced by estimated nutrition needs; ongoing  Goal:   Pt to meet >/= 90% of their estimated nutrition needs  Monitor:   PO and supplement intake, weight, labs, I/O's  Assessment:   38 yo female developed boil in left thigh one week ago. Presented with fever (Tm 102F), lethargy. Found to have Left leg abscess complicated by sepsis, hyperglycemia. Was taken to OR for I&D and remained on vent post op. She has hx of DM, HTN, asthma. Patient had become lethargic after extubation. MRI of the brain was obtained showing embolic infarcts.   Pt very lethargic at time of visit. She reports her appetite is good and she is eating well. She reports taking Pro-stat protein supplement daily without problems. No meal completion documented in nursing notes. Pt states she has not received diet education regarding diabetes management; she would like RD to return tomorrow for diet education.  Labs reviewed: CBG's 96 to 148, low hemoglobin, low calcium, decreased GFR  Height: Ht Readings from Last 1 Encounters:  09/03/13 $RemoveB'5\' 1"'uTaLzmUx$  (1.549 m)    Weight Status:   Wt Readings from Last 1 Encounters:  09/02/13 319 lb 3.6 oz (144.8 kg)    Re-estimated needs:  Kcal: 2100-2300 Protein: 100-115 gm  Fluid: 3-3.3 L/day  Skin: closed incision on left leg; +1 RLE and LLE edema  Diet Order: Carb Control   Intake/Output Summary (Last 24 hours) at 09/08/13 1522 Last data filed at 09/07/13 1844  Gross per 24 hour  Intake      0 ml  Output    450 ml  Net   -450 ml    Last BM: 7/29   Labs:   Recent Labs Lab 09/06/13 0930 09/07/13 0525 09/08/13 0600  NA 134* 137 138  K 4.2 4.0 3.9  CL 100 105 104  CO2 $Re'22 21 20  'qGa$ BUN $R'23 20 19   'iA$ CREATININE 1.09 1.25* 1.27*  CALCIUM 8.0* 7.8* 8.0*  GLUCOSE 235* 100* 114*    CBG (last 3)   Recent Labs  09/07/13 2148 09/08/13 0649 09/08/13 1157  GLUCAP 124* 108* 148*    Scheduled Meds: .  stroke: mapping our early stages of recovery book   Does not apply Once  . antiseptic oral rinse  15 mL Mouth Rinse QID  . arformoterol  15 mcg Nebulization Q12H  . aspirin EC  325 mg Oral Daily  . atorvastatin  20 mg Oral q1800  . budesonide (PULMICORT) nebulizer solution  0.25 mg Nebulization Q12H  . chlorhexidine  15 mL Mouth Rinse BID  . feeding supplement (PRO-STAT SUGAR FREE 64)  30 mL Oral BID WC  . gemfibrozil  600 mg Oral BID AC  . heparin  5,000 Units Subcutaneous 3 times per day  . insulin aspart  0-20 Units Subcutaneous TID WC  . insulin aspart  6 Units Subcutaneous TID WC  . insulin glargine  22 Units Subcutaneous Daily  . insulin starter kit- syringes  1 kit Other Once  . piperacillin-tazobactam (ZOSYN)  IV  3.375 g Intravenous Q8H  . sodium hypochlorite   Irrigation BID  . vancomycin  1,500 mg Intravenous Q12H    Continuous Infusions: . sodium chloride 10 mL/hr at 09/04/13 1734    [name]  Charissa Bash RD, LDN Inpatient Clinical Dietitian Pager: [phone] After Hours Pager: [phone]

## 2013-09-08 NOTE — Consult Note (Addendum)
WOC wound follow up Wound type: Pt has previously been followed by CCS team for assessment and plan of care to left groin and thigh full thickness wound.  Consult requested to assess for better ways to deal with wound becoming soiled when pt is incontinent of stool.  This wound is not appropriate for a vac; refer to previous progress notes on 7/24. There is not adequate skin to inner groin which would allow vac drape to maintain an adequate seal, and pt is frequently incontinent of stool which would be trapped underneath vac sponge.  Pt is also constantly moist to groin and abd skin folds, this would make it difficult to maintain vac dressing seal around wound.  CCS team has ordered moist Dakins dressings BID to groin and thigh wounds to provide antimicrobial benefits.  Pt was previously receiving hydrotherapy to assist with nonviable tissue but wound bed was fairly clean and this therapy was discontinued last week.  Refer to previous progress notes from PT for assessments and measurements.   Pt is frequently incontinent of liquid stools and it is difficult to keep wound from becoming soiled since it is located in close proximity to rectum.  [facility] but does not like it and requests that it be removed.  Explained importance of keeping wound as clean as possible until diarrhea resolves or she becomes strong enough to get OOB to use a commode chair; she reluctantly agrees to allow the device to remain in place at this time. Pt has high BMI and does not assist bedside nurses with turning or using the bedpan. Small amt stool leaking around insertion point.  Pt has foley to control urine.  Recommend leaving this in place as long as possible to promote healing to groin/thigh wound.  Perineum and inner buttocks fold are macerated with patchy areas of partial thickness skin loss; appearance consistent with moisture-associated skin damage.  Barrier cream applied to protect  skin and repel moisture. Explained all aspects of topical treatments and wound care orders with patient; she verbalizes understanding. Please re-consult if further assistance is needed.  Thank-you,  Cammie Mcgeeawn Gergory Biello MSN, RN, CWOCN, [place], CNS [phone]

## 2013-09-08 NOTE — Progress Notes (Signed)
Received order for flexiseal from MD. Patient agreed to put flexiseal in. After procedure patient was upset that it was done and requested to remove it. After explaining again why we put it in she allowed it to remain for the night. Two RNs assisted with procedure in room. Will continue to monitor.

## 2013-09-08 NOTE — Progress Notes (Signed)
Physical Therapy Treatment Patient Details Name: [name] MRN: [id number] DOB: 02/07/1976 Today's Date: 09/08/2013    History of Present Illness 38 yo female developed boil in Lt thigh grossly 7/13. Presented with fever (Tm 102F), lethargy. Found to have Lt leg abscess complicated by sepsis, hyperglycemia. Was taken to OR for I&D and remained on vent post op. 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 PMH:  hx of DM, CVA basal ganglia / cerebellar infarct HTN, asthma.[name]     PT Comments    Finally progressing without so much encouragement to participate.  Still will need some rehab before home  Follow Up Recommendations  SNF;Supervision/Assistance - 24 hour     Equipment Recommendations  Rolling walker with 5" wheels    Recommendations for Other Services       Precautions / Restrictions Precautions Precautions: Fall    Mobility  Bed Mobility Overal bed mobility: Needs Assistance Bed Mobility: Supine to Sit;Sit to Supine     Supine to sit: HOB elevated;Min assist     General bed mobility comments: heavy use of rail; minimal truncal assist  Transfers Overall transfer level: Needs assistance Equipment used: Rolling walker (2 wheeled) Transfers: Sit to/from Stand Sit to Stand: Min assist;+2 safety/equipment         General transfer comment: steady assist  Ambulation/Gait Ambulation/Gait assistance: Min assist;+2 safety/equipment Ambulation Distance (Feet): 40 Feet (then additional 40, and 80 feet with rest in between) Assistive device: Rolling walker (2 wheeled) Gait Pattern/deviations: Step-through pattern;Trunk flexed;Drifts right/left Gait velocity: slow   General Gait Details: Assist needed mainly to control RW due to pt stayed consistently outside walker  and used the RW fairly heavily even when cue not to.   Stairs            Wheelchair Mobility     Modified Rankin (Stroke Patients Only)       Balance Overall balance assessment: Needs assistance Sitting-balance support: No upper extremity supported Sitting balance-Leahy Scale: Fair       Standing balance-Leahy Scale: Fair Standing balance comment: doesn't accept challenge yet.                    Cognition Arousal/Alertness: Awake/alert Behavior During Therapy: WFL for tasks assessed/performed Overall Cognitive Status: Within Functional Limits for tasks assessed     Current Attention Level: Focused Memory: Decreased short-term memory Following Commands: Follows one step commands with increased time;Follows one step commands consistently Safety/Judgement: Decreased awareness of deficits;Decreased awareness of safety   Problem Solving: Slow processing;Decreased initiation      Exercises      General Comments        Pertinent Vitals/Pain     Home Living                      Prior Function            PT Goals (current goals can now be found in the care plan section) Acute Rehab PT Goals PT Goal Formulation: With patient/family Time For Goal Achievement: 09/18/13 Potential to Achieve Goals: Fair Progress towards PT goals: Progressing toward goals    Frequency  Min 3X/week    PT Plan Current plan remains appropriate    Co-evaluation             End of Session   Activity Tolerance: Patient tolerated treatment well Patient left: in chair;with call bell/phone within reach;with family/visitor present  Time: 1610-9604 PT Time Calculation (min): 25 min  Charges:  $Gait Training: 8-22 mins $Therapeutic Activity: 8-22 mins                    G Codes:      [name], Eliseo Gum 09/08/2013, 3:52 PM 09/08/2013  [place], PT [phone]  (pager)

## 2013-09-08 NOTE — Progress Notes (Signed)
CSW provided only bed offer choice to patient and patient's family by bedside. Patient's spouse states [facility]. Patient and patient's spouse state that is too far and they are not going there. CSW explained that is the only bed offer at this time that is accepting LOG/Medicaid. Patient's spouse states he would like to speak with the Case Manager about taking patient home. CSW notified Case Manager.  Maree KrabbeLindsay Florina Glas, MSW, Theresia MajorsLCSWA [phone]

## 2013-09-09 LAB — GLUCOSE, CAPILLARY
Glucose-Capillary: 161 mg/dL — ABNORMAL HIGH (ref 70–99)
Glucose-Capillary: 176 mg/dL — ABNORMAL HIGH (ref 70–99)
Glucose-Capillary: 153 mg/dL — ABNORMAL HIGH (ref 70–99)
Glucose-Capillary: 126 mg/dL — ABNORMAL HIGH (ref 70–99)

## 2013-09-09 LAB — CBC
HCT: 27.7 % — ABNORMAL LOW (ref 36.0–46.0)
Hemoglobin: 8.9 g/dL — ABNORMAL LOW (ref 12.0–15.0)
MCH: 26.6 pg (ref 26.0–34.0)
MCHC: 32.1 g/dL (ref 30.0–36.0)
MCV: 82.7 fL (ref 78.0–100.0)
PLATELETS: 432 10*3/uL — AB (ref 150–400)
RBC: 3.35 MIL/uL — ABNORMAL LOW (ref 3.87–5.11)
RDW: 14.6 % (ref 11.5–15.5)
WBC: 12.4 10*3/uL — ABNORMAL HIGH (ref 4.0–10.5)

## 2013-09-09 LAB — BASIC METABOLIC PANEL
ANION GAP: 10 (ref 5–15)
BUN: 16 mg/dL (ref 6–23)
CO2: 20 mEq/L (ref 19–32)
Calcium: 8 mg/dL — ABNORMAL LOW (ref 8.4–10.5)
Chloride: 107 mEq/L (ref 96–112)
Creatinine, Ser: 1.17 mg/dL — ABNORMAL HIGH (ref 0.50–1.10)
GFR calc non Af Amer: 59 mL/min — ABNORMAL LOW (ref >90)
GFR, EST AFRICAN AMERICAN: 69 mL/min — AB (ref >90)
Glucose, Bld: 140 mg/dL — ABNORMAL HIGH (ref 70–99)
Potassium: 4.3 mEq/L (ref 3.7–5.3)
Sodium: 137 mEq/L (ref 137–147)

## 2013-09-09 LAB — COMPLEMENT, TOTAL: Compl, Total (CH50): >60 U/mL — ABNORMAL HIGH (ref 31–60)

## 2013-09-09 MED ORDER — CIPROFLOXACIN HCL 500 MG PO TABS: 500.0000 mg | ORAL_TABLET | Freq: Two times a day (BID) | ORAL | Status: DC

## 2013-09-09 MED ORDER — GEMFIBROZIL 600 MG PO TABS: 600.0000 mg | ORAL_TABLET | Freq: Two times a day (BID) | ORAL | Status: DC

## 2013-09-09 MED ORDER — BIOTENE DRY MOUTH MT LIQD: 15.0000 mL | Freq: Four times a day (QID) | OROMUCOSAL | Status: DC

## 2013-09-09 MED ORDER — HEPARIN SOD (PORK) LOCK FLUSH 100 UNIT/ML IV SOLN
250.0000 [IU] | INTRAVENOUS | Status: AC | PRN
Start: 2013-09-09 — End: 2013-09-09
  Administered 2013-09-09: 250 [IU]

## 2013-09-09 MED ORDER — ARFORMOTEROL TARTRATE 15 MCG/2ML IN NEBU: 15.0000 ug | INHALATION_SOLUTION | Freq: Two times a day (BID) | RESPIRATORY_TRACT | Status: DC

## 2013-09-09 MED ORDER — DOXYCYCLINE HYCLATE 50 MG PO CAPS: 50.0000 mg | ORAL_CAPSULE | Freq: Two times a day (BID) | ORAL | Status: DC

## 2013-09-09 MED ORDER — INSULIN GLARGINE 100 UNIT/ML ~~LOC~~ SOLN: 22.0000 [IU] | Freq: Every day | SUBCUTANEOUS | Status: DC

## 2013-09-09 MED ORDER — PRO-STAT SUGAR FREE PO LIQD: 30.0000 mL | Freq: Two times a day (BID) | ORAL | Status: DC

## 2013-09-09 MED ORDER — INSULIN ASPART 100 UNIT/ML ~~LOC~~ SOLN: 6.0000 [IU] | Freq: Three times a day (TID) | SUBCUTANEOUS | Status: DC

## 2013-09-09 MED ORDER — ADULT MULTIVITAMIN W/MINERALS CH: 1.0000 | ORAL_TABLET | Freq: Every day | ORAL | Status: DC

## 2013-09-09 MED ORDER — ATORVASTATIN CALCIUM 20 MG PO TABS: 20.0000 mg | ORAL_TABLET | Freq: Every day | ORAL | Status: DC

## 2013-09-09 MED ORDER — BUDESONIDE 0.25 MG/2ML IN SUSP: 0.2500 mg | Freq: Two times a day (BID) | RESPIRATORY_TRACT | Status: DC

## 2013-09-09 MED ORDER — ASPIRIN 325 MG PO TBEC: 325.0000 mg | DELAYED_RELEASE_TABLET | Freq: Every day | ORAL | Status: DC

## 2013-09-09 MED ORDER — INSULIN STARTER KIT- SYRINGES (ENGLISH): 1.0000 | Freq: Once | Status: DC

## 2013-09-09 MED ORDER — INSULIN ASPART 100 UNIT/ML ~~LOC~~ SOLN: 0.0000 [IU] | Freq: Three times a day (TID) | SUBCUTANEOUS | Status: DC

## 2013-09-09 MED ORDER — SACCHAROMYCES BOULARDII 250 MG PO CAPS: 250.0000 mg | ORAL_CAPSULE | Freq: Two times a day (BID) | ORAL | Status: DC

## 2013-09-09 MED ORDER — ACETAMINOPHEN 325 MG PO TABS: 650.0000 mg | ORAL_TABLET | Freq: Four times a day (QID) | ORAL | Status: DC | PRN

## 2013-09-09 NOTE — Discharge Summary (Signed)
Physician Discharge Summary  [name] MPN:[id number] DOB: 04/13/1975 DOA: 08/30/2013  PCP: Barbette Merino, MD  Admit date: 08/30/2013 Discharge date: 09/09/2013  Time spent: 45 minutes  Recommendations for Outpatient Follow-up:  The patient will be discharged to [facility] for rehabilitation. She will need to follow up with her primary care physician within one week of discharge. Patient also to followup with neurology within 2 months of discharge. Patient also may need to have a CBC within one week of discharge. Patient will need followup with Dr. Rosendo Gros, general surgeon within 2-3 weeks of discharge. Patient will continue to need wound care twice daily, with wet-to-dry dressing changes. Patient will be discharged with her flexiseal due to nature of wound.  Patient should continue her medications as prescribed. She should also continue physical as well as occupational rehabilitation as recommended by the nursing facility.  Discharge Diagnoses:  Multiple embolic infarcts Left thigh wound status post surgical drainage and debridement of abscess Sepsis secondary to left thigh abscess Acute encephalopathy secondary to sepsis versus CVA Acute respiratory failure secondary to sepsis with history of asthma Acute kidney injury Hyponatremia Leukocytosis Microcytic anemia Diabetes mellitus type 2, uncontrolled Hypertriglyceridemia Diarrhea  Discharge Condition: Stable  Diet recommendation: Carb modified/heart healthy  Filed Weights   08/30/13 2016 09/01/13 2134 09/02/13 0635  Weight: 135.5 kg (298 lb 11.6 oz) 144.8 kg (319 lb 3.6 oz) 144.8 kg (319 lb 3.6 oz)    History of present illness:  on 08/30/2013  38 yo female developed boil in left thigh one week ago. Presented with fever (Tm 102F), lethargy. Found to have Left leg abscess complicated by sepsis, hyperglycemia. Was taken to OR for I&D and remained on vent post op. She has hx of DM, HTN, asthma.   Hospital Course:  Multiple  embolic infarcts  -Found on MRI of the brain  -CTA head and neck results are noted below, RICA with signs of chronic occlusion  -Echocardiogram is unremarkable, TEE showed no vegetations.  -Neurology was consulted and signed off.  -Recommend continuing aspirin and attending hypercoagulation workup.  -Cardiology was consulted for TEE, no signs of endocarditis  -PT and OT were consulted and recommended nursing facility and she will be discharged today.  Left thigh wound status post surgical drainage and debridement of abscess  -Blood cultures remain negative to date  -Urine culture negative  -Wound culture shows no growth, Gram stain shows abundant gram-negative rods and moderate gram-positive cocci in pairs  -Continue vancomycin and Zosyn  -Patient will need to follow up with [facility] in 2-3 weeks -She will need wet to dry dressing changes BID or TID if needed  Sepsis secondary to left thigh abscess  -Resolved   Acute encephalopathy secondary to sepsis versus CVA  -Improving, patient appears stable.   Acute respiratory failure secondary to sepsis with history of asthma  -Resolved. Patient did require intubation during this hospitalization  -Continue Pulmicort, PRN albuterol,brovana   Acute kidney injury  -Likely secondary to sepsis and acute infarction  -Improved, will continue to monitor   Hyponatremia  -Resolved, patient was given IVF   Leukocytosis  -likely secondary to sepsis versus acute CVA  -Improving and trending downward  -Patient will need CBC in one week  Microcytic anemia  -Hemoglobin remains stable  Diabetes mellitus type 2  -Last HbA1c 15.4  -CBGs remain persistently high  -Continue insulin sliding scale, resistant along with Lantus and CBG monitoring   High triglycerides  -Triglycerides 550  -Continue gemfibrozil   Diarrhea  -Patient had  multiple episodes of diarrhea overnight and required flexiseal  -C. difficile PCR  negative -possibly secondary to  patient's antibiotics  -Diarrhea appears to have slowed down  Morbid obesity -Patient will need to discuss with her primary care physician and diet and exercise plan once she has been cleared and her wound is healed.  Procedures: I&D of left thigh abscess   TEE No LAA thrombus No PFO or ASD Normal LV function No valvular disease or sign of endocarditis or abscess   2D Echocardiogram Normal LV function; severe LVH; mild LAE. No source of embolus  CTA Angiogram of the head and neck  09/05/2013  1. Occlusion of the intracranial right ICA. Multiple small collateral vessels in the region of the right ICA terminus is indicative of chronic occlusion. Right MCA is reconstituted new the MCA bifurcation, with slightly diminished distal flow compared to the left. Right ACA is mainly supplied via the anterior communicating artery. Right ICA is diffusely small in the neck, with moderate stenosis at its origin.  2. Moderate left carotid siphon atherosclerosis. Mild to moderate proximal right PCA stenosis. Mild to moderate attenuation of distal left PCA branch vessels.  3. 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  4. Dominant right vertebral artery without stenosis identified.  5. Small, acute bilateral infarcts as described on recent MRI.  Consultations: Neurology  Surgery  Discharge Exam: Filed Vitals:   09/09/13 0527  BP: 144/77  Pulse: 98  Temp: 99.4 F (37.4 C)  Resp: 18   Exam  General: Well developed, well nourished, NAD, appears stated age  HEENT: NCAT, mucous membranes moist.  Cardiovascular: S1 S2 auscultated, no rubs, murmurs or gallops. Regular rate and rhythm.  Respiratory: Clear to auscultation bilaterally with equal chest rise  Abdomen: Soft, obese, nontender, nondistended, + bowel sounds  Extremities: warm dry without cyanosis clubbing or edema. Left thigh wound currently covered.  Neuro: AAOx3. No focal deficits.  All 4 extremities.  Discharge Instructions      Discharge Instructions   Discharge instructions    Complete by:  As directed   The patient will be discharged to [facility] for rehabilitation. She will need to follow up with her primary care physician within one week of discharge. Patient also to followup with neurology within 2 months of discharge. Patient also may need to have a CBC within one week of discharge. Patient will need followup with Dr. Rosendo Gros, general surgeon within 2-3 weeks of discharge. Patient will continue to need wound care twice daily, with wet-to-dry dressing changes. Patient will be discharged with her flexiseal due to nature of wound.  Patient should continue her medications as prescribed. She should also continue physical as well as occupational rehabilitation as recommended by the nursing facility. Patient should follow a carb modified heart healthy diet.     Wound care    Complete by:  As directed   Remove packing and wash the wound with warm water and gentle soap.  Please continue with Wet to dry (saline soaked gauze with dry gauze on top) dressings and use mesh underwear to hold ABD pad over top as opposed to tape.  Dressing should be changed at least BID and more often as needed for saturation.  As able she is encouraged to get in the shower with the packing out to wash the wound with soap/water then replace the packing at least once a day.  Medication List    STOP taking these medications       beclomethasone 80 MCG/ACT inhaler  Commonly known as:  QVAR     metFORMIN 500 MG tablet  Commonly known as:  GLUCOPHAGE      TAKE these medications       acetaminophen 325 MG tablet  Commonly known as:  TYLENOL  Take 2 tablets (650 mg total) by mouth every 6 (six) hours as needed for mild pain, fever or headache.     albuterol 108 (90 BASE) MCG/ACT inhaler  Commonly known as:  PROVENTIL HFA;VENTOLIN HFA  Inhale 2 puffs into the lungs every 4  (four) hours as needed for wheezing or shortness of breath.     antiseptic oral rinse Liqd  15 mLs by Mouth Rinse route QID.     arformoterol 15 MCG/2ML Nebu  Commonly known as:  BROVANA  Take 2 mLs (15 mcg total) by nebulization every 12 (twelve) hours.     aspirin 325 MG EC tablet  Take 1 tablet (325 mg total) by mouth daily.     atorvastatin 20 MG tablet  Commonly known as:  LIPITOR  Take 1 tablet (20 mg total) by mouth daily at 6 PM.     budesonide 0.25 MG/2ML nebulizer solution  Commonly known as:  PULMICORT  Take 2 mLs (0.25 mg total) by nebulization every 12 (twelve) hours.     ciprofloxacin 500 MG tablet  Commonly known as:  CIPRO  Take 1 tablet (500 mg total) by mouth 2 (two) times daily.     doxycycline 50 MG capsule  Commonly known as:  VIBRAMYCIN  Take 1 capsule (50 mg total) by mouth 2 (two) times daily.     feeding supplement (PRO-STAT SUGAR FREE 64) Liqd  Take 30 mLs by mouth 2 (two) times daily with a meal.     gemfibrozil 600 MG tablet  Commonly known as:  LOPID  Take 1 tablet (600 mg total) by mouth 2 (two) times daily before a meal.     insulin aspart 100 UNIT/ML injection  Commonly known as:  novoLOG  - Inject 0-20 Units into the skin 3 (three) times daily with meals. CBG < 70: implement hypoglycemia protocol  - CBG 70 - 120: 0 units  - CBG 121 - 150: 3 units  - CBG 151 - 200: 4 units  - CBG 201 - 250: 7 units  - CBG 251 - 300: 11 units  - CBG 301 - 350: 15 units  - CBG 351 - 400: 20 units     insulin aspart 100 UNIT/ML injection  Commonly known as:  novoLOG  Inject 6 Units into the skin 3 (three) times daily with meals.     insulin glargine 100 UNIT/ML injection  Commonly known as:  LANTUS  Inject 0.22 mLs (22 Units total) into the skin daily.     insulin starter kit- syringes Misc  1 kit by Other route once.     multivitamin with minerals Tabs tablet  Take 1 tablet by mouth daily.     saccharomyces boulardii 250 MG capsule    Commonly known as:  FLORASTOR  Take 1 capsule (250 mg total) by mouth 2 (two) times daily.     triamterene-hydrochlorothiazide 75-50 MG per tablet  Commonly known as:  MAXZIDE  Take 2 tablets by mouth daily.       Allergies  Allergen Reactions  . Azithromycin Other (See Comments)    Nose bleeding event  Follow-up Information   Follow up with Xu,Jindong, MD. Schedule an appointment as soon as possible for a visit in 2 months. (Stroke Clinic)    Specialty:  Neurology   Contact information:   [address] [place] [address] [phone]       Follow up with Rosario Jacks., Anne Hahn, MD In 2 weeks. (For post-operation check.  Please call the office to verify your appointment time and date in 2-3 weeks.)    Specialty:  General Surgery   Contact information:   [address] [address] [phone]       Follow up with Barbette Merino, MD. Schedule an appointment as soon as possible for a visit in 1 week. [facility] followup)    Specialty:  Internal Medicine   Contact information:   [address] [place] [address] [phone]        The results of significant diagnostics from this hospitalization (including imaging, microbiology, ancillary and laboratory) are listed below for reference.    Significant Diagnostic Studies: Ct Angio Head W/cm &/or Wo Cm  09/05/2013   CLINICAL DATA:  Diabetes, hypertension, and prior stroke with left lower extremity abscess and sepsis. Multiple, likely embolic infarcts on MRI. Stroke workup.  EXAM: CT ANGIOGRAPHY HEAD AND NECK  TECHNIQUE: Multidetector CT imaging of the head and neck was performed using the standard protocol during bolus administration of intravenous contrast. Multiplanar CT image reconstructions and MIPs were obtained to evaluate the vascular anatomy. Carotid stenosis measurements (when applicable) are obtained utilizing NASCET criteria, using the distal internal carotid diameter as the  denominator.  CONTRAST:  13mL OMNIPAQUE IOHEXOL 350 MG/ML SOLN  COMPARISON:  Brain MRI 09/03/2013  FINDINGS: CTA HEAD FINDINGS  Small foci of hypoattenuation are seen in the anterior body and genu of the corpus callosum, right globus pallidus, and left posterior left temporal periventricular white matter corresponding to acute infarcts described on recent MRI additional, punctate acute infarcts on the MRI are not well demonstrated on the CT. Remote infarcts are noted in both cerebellar hemispheres and in the left basal ganglia/ corona radiata. There is no evidence of acute large territory cortical infarct, intracranial hemorrhage, midline shift, or extra-axial fluid collection. There is mild ex vacuo dilatation of the body of the left lateral ventricle. No abnormal enhancement is identified. Orbits are unremarkable. Mastoid air cells and paranasal sinuses are clear.  The intracranial right vertebral artery is patent, dominant, and supplies the basilar. The left intracranial vertebral artery is markedly small in caliber and only intermittently patent. PICA origins appear patent. AICA and SCA origins are patent. Basilar artery is mildly irregular but without stenosis. There is a focal, mild-to-moderate distal right P1 stenosis. There is irregular, mild to moderate attenuation of left P2 and P3 branches. Posterior communicating arteries are not clearly identified.  The proximal right intracranial ICA is diffusely small in caliber with a severe stenosis at the petrous-cavernous junction. The right ICA becomes occluded at the anterior cavernous segment. There is minimal to no reconstitution of the right intracranial ICA distal to this. Multiple collateral vessels are seen in the expected region of the right ICA terminus. These collateral vessels reconstitute the right MCA near the MCA bifurcation. Major right M2 branches appear patent although mildly smaller in caliber than those on the left. A small amount of contrast  is present in the right A1 segment, with the A2 being supplied predominantly by the left ACA via the anterior communicating artery, which appears somewhat bulbous.  The  left internal carotid artery is patent from skullbase to carotid terminus. There is mild calcification and moderate irregularity of the left carotid siphon. There is mild narrowing of the left ICA at the petrous-cavernous junction. There is diffuse irregularity with mild to moderate tandem stenoses involving the anterior genu of the left cavernous carotid as well as the supraclinoid left ICA. There is mild stenosis of the proximal left A1 segment. There is also mild proximal left M1 stenosis. There is mild, diffuse irregularity of left M2 and more distal branches.  Review of the MIP images confirms the above findings.  CTA NECK FINDINGS  Three vessel aortic arch. Subclavian arteries are grossly unremarkable. Evaluation of the proximal common carotid arteries is limited by image noise due to shoulder attenuation as well as mild motion artifact. No mid or distal common carotid artery stenosis is seen. There is moderate focal narrowing of the right internal carotid artery at its origin. The right ICA obtains a more normal caliber for approximately 1 cm beyond the stenosis before becoming narrowed again and demonstrating a diffusely narrowed but smooth luminal contour for the remainder of its course in the neck. The right ICA lumen near its origin at the site of proximal stenosis measures approximately 2.3 mm in diameter, with the diffusely narrowed mid to distal right cervical ICA measuring approximately 2.7 mm in diameter. The left cervical ICA is mildly tortuous but patent and without stenosis.  The proximal right vertebral artery is grossly patent but suboptimally evaluated due to artifact. The V2 and V3 segments of the right vertebral artery are normal in appearance. The left vertebral artery appears occluded at its origin with reconstitution at the  C4 level, with the lumen of the vertebral artery being markedly small in caliber distal to this.  A left subclavian approach central venous catheter is partially visualized. Jugulodigastric lymph nodes measure up to 10 mm in short axis.  Review of the MIP images confirms the above findings.  IMPRESSION: 1. Occlusion of the intracranial right ICA. Multiple small collateral vessels in the region of the right ICA terminus is indicative of chronic occlusion. Right MCA is reconstituted new the MCA bifurcation, with slightly diminished distal flow compared to the left. Right ACA is mainly supplied via the anterior communicating artery. Right ICA is diffusely small in the neck, with moderate stenosis at its origin. 2. Moderate left carotid siphon atherosclerosis. Mild to moderate proximal right PCA stenosis. Mild to moderate attenuation of distal left PCA branch vessels. 3. 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 4. Dominant right vertebral artery without stenosis identified. 5. Small, acute bilateral infarcts as described on recent MRI.   Electronically Signed   By: Logan Bores   On: 09/05/2013 20:48   Ct Angio Neck W/cm &/or Wo/cm  09/05/2013   CLINICAL DATA:  Diabetes, hypertension, and prior stroke with left lower extremity abscess and sepsis. Multiple, likely embolic infarcts on MRI. Stroke workup.  EXAM: CT ANGIOGRAPHY HEAD AND NECK  TECHNIQUE: Multidetector CT imaging of the head and neck was performed using the standard protocol during bolus administration of intravenous contrast. Multiplanar CT image reconstructions and MIPs were obtained to evaluate the vascular anatomy. Carotid stenosis measurements (when applicable) are obtained utilizing NASCET criteria, using the distal internal carotid diameter as the denominator.  CONTRAST:  19mL OMNIPAQUE IOHEXOL 350 MG/ML SOLN  COMPARISON:  Brain MRI 09/03/2013  FINDINGS: CTA HEAD FINDINGS  Small foci of  hypoattenuation are seen in  the anterior body and genu of the corpus callosum, right globus pallidus, and left posterior left temporal periventricular white matter corresponding to acute infarcts described on recent MRI additional, punctate acute infarcts on the MRI are not well demonstrated on the CT. Remote infarcts are noted in both cerebellar hemispheres and in the left basal ganglia/ corona radiata. There is no evidence of acute large territory cortical infarct, intracranial hemorrhage, midline shift, or extra-axial fluid collection. There is mild ex vacuo dilatation of the body of the left lateral ventricle. No abnormal enhancement is identified. Orbits are unremarkable. Mastoid air cells and paranasal sinuses are clear.  The intracranial right vertebral artery is patent, dominant, and supplies the basilar. The left intracranial vertebral artery is markedly small in caliber and only intermittently patent. PICA origins appear patent. AICA and SCA origins are patent. Basilar artery is mildly irregular but without stenosis. There is a focal, mild-to-moderate distal right P1 stenosis. There is irregular, mild to moderate attenuation of left P2 and P3 branches. Posterior communicating arteries are not clearly identified.  The proximal right intracranial ICA is diffusely small in caliber with a severe stenosis at the petrous-cavernous junction. The right ICA becomes occluded at the anterior cavernous segment. There is minimal to no reconstitution of the right intracranial ICA distal to this. Multiple collateral vessels are seen in the expected region of the right ICA terminus. These collateral vessels reconstitute the right MCA near the MCA bifurcation. Major right M2 branches appear patent although mildly smaller in caliber than those on the left. A small amount of contrast is present in the right A1 segment, with the A2 being supplied predominantly by the left ACA via the anterior communicating artery, which  appears somewhat bulbous.  The left internal carotid artery is patent from skullbase to carotid terminus. There is mild calcification and moderate irregularity of the left carotid siphon. There is mild narrowing of the left ICA at the petrous-cavernous junction. There is diffuse irregularity with mild to moderate tandem stenoses involving the anterior genu of the left cavernous carotid as well as the supraclinoid left ICA. There is mild stenosis of the proximal left A1 segment. There is also mild proximal left M1 stenosis. There is mild, diffuse irregularity of left M2 and more distal branches.  Review of the MIP images confirms the above findings.  CTA NECK FINDINGS  Three vessel aortic arch. Subclavian arteries are grossly unremarkable. Evaluation of the proximal common carotid arteries is limited by image noise due to shoulder attenuation as well as mild motion artifact. No mid or distal common carotid artery stenosis is seen. There is moderate focal narrowing of the right internal carotid artery at its origin. The right ICA obtains a more normal caliber for approximately 1 cm beyond the stenosis before becoming narrowed again and demonstrating a diffusely narrowed but smooth luminal contour for the remainder of its course in the neck. The right ICA lumen near its origin at the site of proximal stenosis measures approximately 2.3 mm in diameter, with the diffusely narrowed mid to distal right cervical ICA measuring approximately 2.7 mm in diameter. The left cervical ICA is mildly tortuous but patent and without stenosis.  The proximal right vertebral artery is grossly patent but suboptimally evaluated due to artifact. The V2 and V3 segments of the right vertebral artery are normal in appearance. The left vertebral artery appears occluded at its origin with reconstitution at the C4 level, with the lumen of the vertebral artery being markedly small in caliber distal  to this.  A left subclavian approach central  venous catheter is partially visualized. Jugulodigastric lymph nodes measure up to 10 mm in short axis.  Review of the MIP images confirms the above findings.  IMPRESSION: 1. Occlusion of the intracranial right ICA. Multiple small collateral vessels in the region of the right ICA terminus is indicative of chronic occlusion. Right MCA is reconstituted new the MCA bifurcation, with slightly diminished distal flow compared to the left. Right ACA is mainly supplied via the anterior communicating artery. Right ICA is diffusely small in the neck, with moderate stenosis at its origin. 2. Moderate left carotid siphon atherosclerosis. Mild to moderate proximal right PCA stenosis. Mild to moderate attenuation of distal left PCA branch vessels. 3. 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 4. Dominant right vertebral artery without stenosis identified. 5. Small, acute bilateral infarcts as described on recent MRI.   Electronically Signed   By: Sebastian Ache   On: 09/05/2013 20:48   Mr Brain Wo Contrast  09/03/2013   CLINICAL DATA:  Change in level of consciousness. Rule out stroke. Thigh abscess with sepsis.  EXAM: MRI HEAD WITHOUT CONTRAST  TECHNIQUE: Multiplanar, multiecho pulse sequences of the brain and surrounding structures were obtained without intravenous contrast.  COMPARISON:  None.  FINDINGS: Images are mildly to moderately degraded by motion. There are numerous punctate foci of restricted diffusion involving cortex and white matter of both cerebral hemispheres consistent with acute infarcts. Several slightly larger foci of acute infarction are present in the corpus callosum measuring up to approximately 1.3 cm in size. Small, acute infarcts are also present in the right greater than left basal ganglia, and there is a punctate acute infarct anteriorly in the right aspect of the medulla. There may be a small amount of hemorrhage associated with a few of  these punctate infarcts. Old infarcts are present in the bilateral basal ganglia and bilateral cerebellum.  There is no mass, midline shift, or extra-axial fluid collection. There is mild cerebral and cerebellar atrophy. Orbits are unremarkable. Minimal ethmoid air cell mucosal thickening is noted. Mastoid air cells are clear.  IMPRESSION: 1. Multiple small infarcts involving both cerebral hemispheres, corpus callosum, and medulla, consistent with embolic infarcts. These could be bland or septic emboli given history. 2. 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   Electronically Signed   By: Sebastian Ache   On: 09/03/2013 17:38   Dg Chest Port 1 View  09/05/2013   CLINICAL DATA:  Left-sided PICC placement.  EXAM: PORTABLE CHEST - 1 VIEW  COMPARISON:  Chest x-ray 08/31/2013.  FINDINGS: There is a left upper extremity PICC with tip terminating in the superior aspect of the right atrium (approximately 3 cm beyond the superior cavoatrial junction). No pneumothorax. No acute consolidative airspace disease. No pleural effusions. No evidence of pulmonary edema. Mild cardiomegaly. Upper mediastinal contours are within normal limits.  IMPRESSION: 1. Tip of PICC is in the right atrium approximately 3 cm distal to the superior cavoatrial junction. 2. Status post extubation with significantly improved aeration. 3. Mild cardiomegaly.   Electronically Signed   By: Trudie Reed M.D.   On: 09/05/2013 14:21   Portable Chest Xray In Am  08/31/2013   CLINICAL DATA:  Respiratory failure.  EXAM: PORTABLE CHEST - 1 VIEW  COMPARISON:  08/30/2013.  FINDINGS: The endotracheal tube is 4 cm above the carina. The NG tube is coursing down the esophagus and into the stomach. The heart is  enlarged but unchanged. Persistent mild perihilar edema and probable small effusions.  IMPRESSION: Stable support apparatus.  Persistent perihilar edema and areas of atelectasis.   Electronically Signed   By: Kalman Jewels M.D.   On: 08/31/2013 07:29     Dg Chest Port 1 View  08/30/2013   CLINICAL DATA:  Endotracheal tube placement.  EXAM: PORTABLE CHEST - 1 VIEW  COMPARISON:  08/30/2013 and 02/21/2013.  FINDINGS: 2214 hr. Endotracheal tube appears unchanged, within the mid trachea. There is improved aeration of the lungs with residual patchy left greater than right basilar airspace opacities. There is no pleural effusion or pneumothorax. The heart size and mediastinal contours are stable.  IMPRESSION: Interval improved aeration of the lung bases. Endotracheal tube appears unchanged within the midtrachea.   Electronically Signed   By: Camie Patience M.D.   On: 08/30/2013 23:16   Portable Chest Xray  08/30/2013   CLINICAL DATA:  Endotracheal tube placement  EXAM: PORTABLE CHEST - 1 VIEW  COMPARISON:  Chest radiograph 02/21/2013  FINDINGS: Markedly low lung volumes limit evaluation. The endotracheal tube terminates within the distal trachea approximately 5 mm superior to the carina. Cardiomegaly. Heterogeneous lower lung opacities, left greater than right. No pleural effusion or pneumothorax.  IMPRESSION: Endotracheal tube terminates within the distal trachea. Recommend retraction and re-evaluation with repeat radiograph.  Scattered heterogeneous opacities favored to represent atelectasis in the setting of markedly low lung volumes.  Discussed with Nurse Reece Leader at 8:43 p.m. on 08/30/2013.   Electronically Signed   By: Lovey Newcomer M.D.   On: 08/30/2013 20:44   Dg Abd Portable 1v  08/31/2013   CLINICAL DATA:  Enteric tube placement.  EXAM: PORTABLE ABDOMEN - 1 VIEW  COMPARISON:  Lumbar spine radiographs performed 09/26/2011  FINDINGS: The patient's enteric tube is seen ending at the body of the stomach, with the sideport at the fundus of the stomach.  The visualized bowel gas pattern is grossly unremarkable, with scattered air noted in the colon. No free intra-abdominal air is identified, though evaluation for free air is limited on a single supine view  and due to motion artifact. No acute osseous abnormalities are seen.  IMPRESSION: Enteric tube is seen ending at the body of the stomach, with the sideport at the fundus of the stomach.   Electronically Signed   By: Garald Balding M.D.   On: 08/31/2013 05:45    Microbiology: Recent Results (from the past 240 hour(s))  CULTURE, BLOOD (ROUTINE X 2)     Status: None   Collection Time    08/30/13  2:29 PM      Result Value Ref Range Status   Specimen Description BLOOD LEFT ANTECUBITAL   Final   Special Requests     Final   Value: BOTTLES DRAWN AEROBIC AND ANAEROBIC 6CC AER 5CC ANA   Culture  Setup Time     Final   Value: 08/30/2013 19:36     Performed at [facility]   Culture     Final   Value: NO GROWTH 5 DAYS     Performed at [facility]   Report Status 09/05/2013 FINAL   Final  CULTURE, BLOOD (ROUTINE X 2)     Status: None   Collection Time    08/30/13  2:41 PM      Result Value Ref Range Status   Specimen Description BLOOD LEFT HAND   Final   Special Requests BOTTLES DRAWN AEROBIC AND ANAEROBIC [facility]   Final  Culture  Setup Time     Final   Value: 08/30/2013 19:36     Performed at [facility]   Culture     Final   Value: STAPHYLOCOCCUS SPECIES (COAGULASE NEGATIVE)     Note: THE SIGNIFICANCE OF ISOLATING THIS ORGANISM FROM A SINGLE SET OF BLOOD CULTURES WHEN MULTIPLE SETS ARE DRAWN IS UNCERTAIN. PLEASE NOTIFY THE MICROBIOLOGY DEPARTMENT WITHIN ONE WEEK IF SPECIATION AND SENSITIVITIES ARE REQUIRED.     Note: Gram Stain Report Called to,Read Back By and Verified With: SARA G$RemoveBef'@12'[email or web address]$ :54PM ON 08/31/13 BY DANTS     Performed at [facility]   Report Status 09/01/2013 FINAL   Final  WOUND CULTURE     Status: None   Collection Time    08/30/13  5:06 PM      Result Value Ref Range Status   Specimen Description WOUND THIGH LEFT   Final   Special Requests PATIENT ON FOLLOWING VANCOMYCIN, ZOSYN   Final   Gram Stain     Final   Value: RARE WBC PRESENT,  PREDOMINANTLY MONONUCLEAR     NO SQUAMOUS EPITHELIAL CELLS SEEN     ABUNDANT GRAM NEGATIVE RODS     FEW GRAM POSITIVE COCCI IN PAIRS     IN CLUSTERS FEW GRAM POSITIVE RODS   Culture     Final   Value: NO GROWTH 2 DAYS     Performed at [facility]   Report Status 09/02/2013 FINAL   Final  ANAEROBIC CULTURE     Status: None   Collection Time    08/30/13  5:06 PM      Result Value Ref Range Status   Specimen Description WOUND THIGH LEFT   Final   Special Requests PATIENT ON FOLLOWING VANCOMYCIN, ZOSYN   Final   Gram Stain     Final   Value: RARE WBC PRESENT, PREDOMINANTLY MONONUCLEAR     NO SQUAMOUS EPITHELIAL CELLS SEEN     ABUNDANT GRAM NEGATIVE RODS     FEW GRAM POSITIVE COCCI IN PAIRS     IN CLUSTERS FEW GRAM POSITIVE RODS   Culture     Final   Value: ABUNDANT BACTEROIDES UNIFORMIS     Note: BETA LACTAMASE POSITIVE     Performed at [facility]   Report Status 09/06/2013 FINAL   Final  MRSA PCR SCREENING     Status: None   Collection Time    08/30/13  7:11 PM      Result Value Ref Range Status   MRSA by PCR NEGATIVE  NEGATIVE Final   Comment:            The GeneXpert MRSA Assay (FDA     approved for NASAL specimens     only), is one component of a     comprehensive MRSA colonization     surveillance program. It is not     intended to diagnose MRSA     infection nor to guide or     monitor treatment for     MRSA infections.  URINE CULTURE     Status: None   Collection Time    08/30/13  7:12 PM      Result Value Ref Range Status   Specimen Description URINE, CATHETERIZED   Final   Special Requests NONE   Final   Culture  Setup Time     Final   Value: 08/31/2013 00:49     Performed at [facility]  Count     Final   Value: NO GROWTH     Performed at [facility]   Culture     Final   Value: NO GROWTH     Performed at [facility]   Report Status 09/01/2013 FINAL   Final  CULTURE, BLOOD (ROUTINE X 2)     Status:  None   Collection Time    09/01/13 11:10 AM      Result Value Ref Range Status   Specimen Description BLOOD LEFT ANTECUBITAL   Final   Special Requests BOTTLES DRAWN [facility]EROBIC ONLY [facility]   Final   Culture  Setup Time     Final   Value: 09/01/2013 14:01     Performed at [facility]   Culture     Final   Value: NO GROWTH 5 DAYS     Performed at [facility]   Report Status 09/07/2013 FINAL   Final  CULTURE, BLOOD (ROUTINE X 2)     Status: None   Collection Time    09/01/13 11:20 AM      Result Value Ref Range Status   Specimen Description BLOOD LEFT HAND   Final   Special Requests BOTTLES DRAWN AEROBIC ONLY [facility]   Final   Culture  Setup Time     Final   Value: 09/01/2013 14:01     Performed at [facility]   Culture     Final   Value: NO GROWTH 5 DAYS     Performed at [facility]   Report Status 09/07/2013 FINAL   Final  CULTURE, BLOOD (ROUTINE X 2)     Status: None   Collection Time    09/06/13  2:20 PM      Result Value Ref Range Status   Specimen Description BLOOD RIGHT THUMB   Final   Special Requests BOTTLES DRAWN AEROBIC AND ANAEROBIC 10CC   Final   Culture  Setup Time     Final   Value: 09/06/2013 19:16     Performed at [facility]   Culture     Final   Value:        BLOOD CULTURE RECEIVED NO GROWTH TO DATE CULTURE WILL BE HELD FOR 5 DAYS BEFORE ISSUING A FINAL NEGATIVE REPORT     Performed at [facility]   Report Status PENDING   Incomplete  CULTURE, BLOOD (ROUTINE X 2)     Status: None   Collection Time    09/06/13  2:35 PM      Result Value Ref Range Status   Specimen Description BLOOD RIGHT HAND   Final   Special Requests BOTTLES DRAWN AEROBIC AND ANAEROBIC [facility]   Final   Culture  Setup Time     Final   Value: 09/06/2013 19:16     Performed at [facility]   Culture     Final   Value:        BLOOD CULTURE RECEIVED NO GROWTH TO DATE CULTURE WILL BE HELD FOR 5 DAYS BEFORE ISSUING A FINAL NEGATIVE REPORT      Performed at [facility]   Report Status PENDING   Incomplete  CLOSTRIDIUM DIFFICILE BY PCR     Status: None   Collection Time    09/08/13 11:25 AM      Result Value Ref Range Status   C difficile by pcr NEGATIVE  NEGATIVE Final     Labs: Basic Metabolic Panel:  Recent  Labs Lab 09/05/13 0549 09/06/13 0930 09/07/13 0525 09/08/13 0600 09/09/13 0550  NA 138 134* 137 138 137  K 3.7 4.2 4.0 3.9 4.3  CL 96 100 105 104 107  CO2 $Re'23 22 21 20 20  'CdR$ GLUCOSE 152* 235* 100* 114* 140*  BUN $Re'11 23 20 19 16  'zmi$ CREATININE 0.75 1.09 1.25* 1.27* 1.17*  CALCIUM 8.8 8.0* 7.8* 8.0* 8.0*   Liver Function Tests: No results found for this basename: AST, ALT, ALKPHOS, BILITOT, PROT, ALBUMIN,  in the last 168 hours No results found for this basename: LIPASE, AMYLASE,  in the last 168 hours No results found for this basename: AMMONIA,  in the last 168 hours CBC:  Recent Labs Lab 09/04/13 0442 09/06/13 0930 09/07/13 0525 09/08/13 0600 09/09/13 0550  WBC 11.6* 14.6* 13.2* 12.7* 12.4*  HGB 11.6* 9.4* 8.6* 8.8* 8.9*  HCT 35.9* 28.5* 26.8* 27.1* 27.7*  MCV 83.5 81.7 82.2 82.1 82.7  PLT 350 406* 394 427* 432*   Cardiac Enzymes: No results found for this basename: CKTOTAL, CKMB, CKMBINDEX, TROPONINI,  in the last 168 hours BNP: BNP (last 3 results) No results found for this basename: PROBNP,  in the last 8760 hours CBG:  Recent Labs Lab 09/08/13 0649 09/08/13 1157 09/08/13 1656 09/08/13 2117 09/09/13 0642  GLUCAP 108* 148* 113* 91 161*       Signed:  Kensli [name]  [facility] 09/09/2013, 10:28 AM

## 2013-09-09 NOTE — Progress Notes (Signed)
Pt agreeable to going to [facility]. Family at bedside and aware. [facility] will be called. MD will be made aware.

## 2013-09-09 NOTE — Progress Notes (Signed)
Talked with Dr. Catha GosselinMikhail, she says she's going to SNF today since the family has agreed.  She will need a repeat check of her CBC, BID WD dressings of her left thigh wound (more often as needed for saturation).  She should be encouraged to get in the shower with the wound unpacked to aid in healing and provide adequate cleaning to the area which may get contaminated with stool.  She can continue with rectal tube to help keep the area clean.  F/u with Dr. Derrell Lollingamirez in 2-3 weeks for post op check.  Call the office for appt/time date.  Would continue oral antibiotics for 7 more days at discharge or longer if her WBC stays elevated.

## 2013-09-09 NOTE — Progress Notes (Signed)
  RD met with patient for nutrition education regarding diabetes, per patient request. Pt states she has had diabetes for 8 years; was using insulin PTA. She states she has never received any diet education regarding diabetes in the past.   Lab Results  Component Value Date   HGBA1C 9.0* 09/05/2013    RD provided "Carbohydrate Counting for People with Diabetes" handout from the Academy of [employer] and Dietetics. Discussed different food groups and their effects on blood sugar, emphasizing carbohydrate-containing foods. Provided list of carbohydrates and recommended serving sizes of common foods.   Reviewed patient's dietary recall.  Breakfast: Eggs, bacon, french toast Lunch: Chicken sandwich Dinner: Salad, chicken Beverages: soda, juice, water, hot tea  Discussed importance of controlled and consistent carbohydrate intake throughout the day. Provided examples of ways to balance meals/snacks and encouraged intake of high-fiber, whole grain complex carbohydrates. Encouraged daily intake of fruits and vegetables and decreased intake of high fat/fried foods. Provided sample menus. Teach back method used. Pt states she can drink diet soda and will decrease her intake of juice.  Expect fair compliance.  Body mass index is 60.35 kg/(m^2). Pt meets criteria for Morbid Obesity based on current BMI.  RD following patient. See full assessment note from 7/29 for more details.   Pryor Ochoa RD, LDN Inpatient Clinical Dietitian Pager: [phone] After Hours Pager: [phone]

## 2013-09-09 NOTE — Progress Notes (Signed)
Pt, family made aware that they will not be leaving until tomorrow (Friday 7/31). Pt and family understand and are still willing to go to SNF.

## 2013-09-09 NOTE — Discharge Instructions (Signed)
Please continue with Wet to dry (saline soaked gauze with dry gauze on top) dressings and use mesh underwear to hold ABD pad over top as opposed to tape.  Dressing should be changed at least BID and more often as needed for saturation.

## 2013-09-09 NOTE — Progress Notes (Signed)
Pt and family refusing to go to [facility]. Pt, family reminded of benefits of intensive therapy and risks of going home. Charge RN, MD made aware. Transport told that pt is leaving AMA. Will get the paperwork [facility].

## 2013-09-09 NOTE — Discharge Summary (Addendum)
Addendum 12:06 PM Patient opted to not go to [facility]. Patient opted to be discharged AGAINST MEDICAL ADVICE. Patient was strongly urged to change her decision to going to the nursing home, however, patient states that she rather go home. She was advised against do to increased infection, even death. Patient understands and is willing to assume that risk.  3:55PM Patient has changed her mind, yet again, and now wishes to be discharged to a nursing home, however, at this time, pending placement, as patient has lost bed at [facility].

## 2013-09-09 NOTE — Clinical Social Work Note (Signed)
Covering CSW received handoff stating pt to be discharged to [facility]F on 09/09/2013. CSW met with pt and pt's spouse at bedside. Pt's spouse expressed many questions regarding placement. CSW informed pt [facility] was pt's only SNF bed offer due to insurance restrictions (Medicaid - LOG). CSW offered support and answered all of pt and pt's spouse questions. Pt stated she would like to be discharged home and not SNF. CSW consulted with RNCM and MD regarding information above.  Per report, MD met with pt and pt's spouse and explained the risk of pt returning home from [facility] and if pt decided to refuse SNF pt would be discharged AGAINST MEDICAL ADVICE. Per report, pt and pt's spouse agreeable to SNF. CSW instructed by MD to arrange for EMS ([facility]).   CSW received report from RN that EMS ([facility]) had arrived on unit and pt refusing to discharge to SNF. MD notified. Per report, MD again explained severe risk of pt not discharging to SNF and discharging home. Per report, pt understanding and stated will be discharging AGAINST MEDICAL ADVICE.   CSW has notified [facility]. CSW signing off.   , MSW, LCSWA Licensed Clinical Social Worker [id number] and [id number] [phone] 

## 2013-09-10 DIAGNOSIS — I6340 Cerebral infarction due to embolism of unspecified cerebral artery: Secondary | ICD-10-CM

## 2013-09-10 LAB — GLUCOSE, CAPILLARY
GLUCOSE-CAPILLARY: 229 mg/dL — AB (ref 70–99)
GLUCOSE-CAPILLARY: 139 mg/dL — AB (ref 70–99)
Glucose-Capillary: 108 mg/dL — ABNORMAL HIGH (ref 70–99)
Glucose-Capillary: 118 mg/dL — ABNORMAL HIGH (ref 70–99)

## 2013-09-10 MED ORDER — TRIAMTERENE-HCTZ 75-50 MG PO TABS
2.0000 | ORAL_TABLET | Freq: Every day | ORAL | Status: DC
  Administered 2013-09-11 – 2013-09-13 (×3): 2 via ORAL
  Filled 2013-09-10 (×5): qty 2

## 2013-09-10 NOTE — Progress Notes (Signed)
Patient was to be discharged yesterday on 09/09/2013. She did have personally at [facility] however she refused and opted to leave AMA. Patient then changed her mind again for the third time in one day, and now directed go to a nursing home facility. However at this time are no better or worse. Patient is still stable for discharge.  No changes made.   Time spent: 10 minutes.  Emmry Hinsch D.O. [facility] Pager [phone]  If 7PM-7AM, please contact night-coverage [email or web address] Password [email or web address] 09/10/2013, 4:04 PM

## 2013-09-10 NOTE — Progress Notes (Signed)
Pt's husband stated to the RN and MD that he would sue the hospital if anything happened to his wife at [facility] (her SNF placement) while he wasn't there, because he does not believe there are no facilities in [place] that will take [place]. Will discuss with Engineer, maintenancedirector and charge RN.

## 2013-09-10 NOTE — Clinical Social Work Note (Signed)
CSW received report on 09/09/2013 late afternoon after pt declared wanting to leave AMA (against medical advice) and refusing [facility], that pt was now agreeable to SNF placement at [facility]. CSW contacted admissions liaison for [facility] who stated [facility] had [facility].   CSW has made multiple attempts to provide pt with another SNF bed offer. Currently pt has two SNF that are reviewing pt's information for possible SNF placement. Clinical research associate([facility]  CSW has consulted with CSW ChiropodistAssistant Director regarding information above. CSW has updated RN and MD regarding information above.  CSW will continue to search for a discharge option for pt.  Linda DeistEmily Jolynne [name], MSW, [facility]WA Licensed Clinical Social Worker [phone] and [phone] [phone]

## 2013-09-10 NOTE — Progress Notes (Signed)
Physical Therapy Treatment Patient Details Name: Linda HarmsDavenia [name] MRN: [id number] DOB: 12/16/1975 Today's Date: 09/10/2013    History of Present Illness 38 yo female developed boil in Lt thigh grossly 7/13. Presented with fever (Tm 102F), lethargy. Found to have Lt leg abscess complicated by sepsis, hyperglycemia. Was taken to OR for I&D and remained on vent post op. 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 PMH:  hx of DM, CVA basal ganglia / cerebellar infarct HTN, asthma.[name]     PT Comments    Patient agreeable to work with PT today.  Making improvements with mobility.  Agree with need for SNF at discharge.  Follow Up Recommendations  SNF;Supervision/Assistance - 24 hour     Equipment Recommendations  Rolling walker with 5" wheels    Recommendations for Other Services       Precautions / Restrictions Precautions Precautions: Fall Restrictions Weight Bearing Restrictions: No    Mobility  Bed Mobility Overal bed mobility: Needs Assistance Bed Mobility: Rolling;Sidelying to Sit Rolling: Min guard Sidelying to sit: Min guard;HOB elevated       General bed mobility comments: Verbal cues and encouragement to move to EOB.  Patient requiring increased time for mobility.  Use of bedrail and HOB elevated.  Transfers Overall transfer level: Needs assistance Equipment used: Rolling walker (2 wheeled) Transfers: Sit to/from Stand Sit to Stand: Mod assist;+2 safety/equipment         General transfer comment: Verbal cues for hand placement.  Patient continues to reach for RW to pull up.  Assist to scoot to EOB.  Assist to rise to standing and for balance.  Ambulation/Gait Ambulation/Gait assistance: Min assist;+2 safety/equipment Ambulation Distance (Feet): 20 Feet Assistive device: Rolling walker (2 wheeled) Gait Pattern/deviations: Step-through pattern;Decreased step length -  right;Decreased step length - left;Decreased stride length;Shuffle;Trunk flexed Gait velocity: slow Gait velocity interpretation: Below normal speed for age/gender General Gait Details: Verbal cues to stand upright.  Patient relying heavily on RW    Stairs            Wheelchair Mobility    Modified Rankin (Stroke Patients Only)       Balance                                    Cognition Arousal/Alertness: Awake/alert Behavior During Therapy: Flat affect Overall Cognitive Status: Impaired/Different from baseline Area of Impairment: Following commands;Safety/judgement;Problem solving       Following Commands: Follows one step commands with increased time;Follows one step commands consistently Safety/Judgement: Decreased awareness of deficits;Decreased awareness of safety   Problem Solving: Slow processing;Decreased initiation      Exercises      General Comments        Pertinent Vitals/Pain     Home Living                      Prior Function            PT Goals (current goals can now be found in the care plan section) Progress towards PT goals: Progressing toward goals    Frequency  Min 3X/week    PT Plan Current plan remains appropriate    Co-evaluation             End of Session Equipment Utilized During Treatment: Gait belt Activity Tolerance: Patient limited by fatigue Patient left: in chair;with call  bell/phone within reach;with chair alarm set;with nursing/sitter in room     Time: 1610-9604 PT Time Calculation (min): 23 min  Charges:  $Gait Training: 8-22 mins $Therapeutic Activity: 8-22 mins                    G Codes:      Vena Austria 09-13-13, 5:38 PM Durenda Hurt. Renaldo Fiddler, [facility] Acute Rehab Services Pager [phone]

## 2013-09-10 NOTE — Progress Notes (Signed)
ANTIBIOTIC CONSULT NOTE - Follow-up  Pharmacy Consult for Vancomycin/Zosyn Indication: Wound infection , LLE abscess   Allergies  Allergen Reactions  . Azithromycin Other (See Comments)    Nose bleeding event    Patient Measurements: Height:  (154.9 cm) Weight: 319 lb 3.6 oz (144.8 kg) IBW/kg (Calculated) : 47.8  Vital Signs: Temp: 98.5 F (36.9 C) (07/31 0649) Temp src: Oral (07/31 0649) BP: 184/101 mmHg (07/31 0649) Pulse Rate: 91 (07/31 0649) Intake/Output from previous day: 07/30 0701 - 07/31 0700 In: 10 [I.V.:10] Out: -  Intake/Output from this shift:    Labs:  Recent Labs  09/08/13 0600 09/09/13 0550  WBC 12.7* 12.4*  HGB 8.8* 8.9*  PLT 427* 432*  CREATININE 1.27* 1.17*   Estimated Creatinine Clearance: 90.9 ml/min (by C-G formula based on Cr of 1.17). No results found for this basename: VANCOTROUGH, VANCOPEAK, VANCORANDOM, GENTTROUGH, GENTPEAK, GENTRANDOM, TOBRATROUGH, TOBRAPEAK, TOBRARND, AMIKACINPEAK, AMIKACINTROU, AMIKACIN,  in the last 72 hours   Microbiology: Recent Results (from the past 720 hour(s))  CULTURE, BLOOD (ROUTINE X 2)     Status: None   Collection Time    08/30/13  2:29 PM      Result Value Ref Range Status   Specimen Description BLOOD LEFT ANTECUBITAL   Final   Special Requests     Final   Value: BOTTLES DRAWN AEROBIC AND ANAEROBIC 6CC AER 5CC ANA   Culture  Setup Time     Final   Value: 08/30/2013 19:36     Performed at [facility]   Culture     Final   Value: NO GROWTH 5 DAYS     Performed at [facility]   Report Status 09/05/2013 FINAL   Final  CULTURE, BLOOD (ROUTINE X 2)     Status: None   Collection Time    08/30/13  2:41 PM      Result Value Ref Range Status   Specimen Description BLOOD LEFT HAND   Final   Special Requests BOTTLES DRAWN AEROBIC AND ANAEROBIC [facility]   Final   Culture  Setup Time     Final   Value: 08/30/2013 19:36     Performed at [facility]   Culture     Final   Value:  STAPHYLOCOCCUS SPECIES (COAGULASE NEGATIVE)     Note: THE SIGNIFICANCE OF ISOLATING THIS ORGANISM FROM A SINGLE SET OF BLOOD CULTURES WHEN MULTIPLE SETS ARE DRAWN IS UNCERTAIN. PLEASE NOTIFY THE MICROBIOLOGY DEPARTMENT WITHIN ONE WEEK IF SPECIATION AND SENSITIVITIES ARE REQUIRED.     Note: Gram Stain Report Called to,Read Back By and Verified With: SARA G@12 :54PM ON 08/31/13 BY DANTS     Performed at [facility]   Report Status 09/01/2013 FINAL   Final  WOUND CULTURE     Status: None   Collection Time    08/30/13  5:06 PM      Result Value Ref Range Status   Specimen Description WOUND THIGH LEFT   Final   Special Requests PATIENT ON FOLLOWING VANCOMYCIN, ZOSYN   Final   Gram Stain     Final   Value: RARE WBC PRESENT, PREDOMINANTLY MONONUCLEAR     NO SQUAMOUS EPITHELIAL CELLS SEEN     ABUNDANT GRAM NEGATIVE RODS     FEW GRAM POSITIVE COCCI IN PAIRS     IN CLUSTERS FEW GRAM POSITIVE RODS   Culture     Final   Value: NO GROWTH 2 DAYS     Performed at  [facility]   Report Status 09/02/2013 FINAL   Final  ANAEROBIC CULTURE     Status: None   Collection Time    08/30/13  5:06 PM      Result Value Ref Range Status   Specimen Description WOUND THIGH LEFT   Final   Special Requests PATIENT ON FOLLOWING VANCOMYCIN, ZOSYN   Final   Gram Stain     Final   Value: RARE WBC PRESENT, PREDOMINANTLY MONONUCLEAR     NO SQUAMOUS EPITHELIAL CELLS SEEN     ABUNDANT GRAM NEGATIVE RODS     FEW GRAM POSITIVE COCCI IN PAIRS     IN CLUSTERS FEW GRAM POSITIVE RODS   Culture     Final   Value: ABUNDANT BACTEROIDES UNIFORMIS     Note: BETA LACTAMASE POSITIVE     Performed at [facility]   Report Status 09/06/2013 FINAL   Final  MRSA PCR SCREENING     Status: None   Collection Time    08/30/13  7:11 PM      Result Value Ref Range Status   MRSA by PCR NEGATIVE  NEGATIVE Final   Comment:            The GeneXpert MRSA Assay (FDA     approved for NASAL specimens     only), is one  component of a     comprehensive MRSA colonization     surveillance program. It is not     intended to diagnose MRSA     infection nor to guide or     monitor treatment for     MRSA infections.  URINE CULTURE     Status: None   Collection Time    08/30/13  7:12 PM      Result Value Ref Range Status   Specimen Description URINE, CATHETERIZED   Final   Special Requests NONE   Final   Culture  Setup Time     Final   Value: 08/31/2013 00:49     Performed at [facility] Count     Final   Value: NO GROWTH     Performed at [facility]   Culture     Final   Value: NO GROWTH     Performed at [facility]   Report Status 09/01/2013 FINAL   Final  CULTURE, BLOOD (ROUTINE X 2)     Status: None   Collection Time    09/01/13 11:10 AM      Result Value Ref Range Status   Specimen Description BLOOD LEFT ANTECUBITAL   Final   Special Requests BOTTLES DRAWN AEROBIC ONLY [facility]   Final   Culture  Setup Time     Final   Value: 09/01/2013 14:01     Performed at [facility]   Culture     Final   Value: NO GROWTH 5 DAYS     Performed at [facility]   Report Status 09/07/2013 FINAL   Final  CULTURE, BLOOD (ROUTINE X 2)     Status: None   Collection Time    09/01/13 11:20 AM      Result Value Ref Range Status   Specimen Description BLOOD LEFT HAND   Final   Special Requests BOTTLES DRAWN AEROBIC ONLY [facility]   Final   Culture  Setup Time     Final   Value: 09/01/2013 14:01     Performed at [facility]  Culture     Final   Value: NO GROWTH 5 DAYS     Performed at [facility]   Report Status 09/07/2013 FINAL   Final  CULTURE, BLOOD (ROUTINE X 2)     Status: None   Collection Time    09/06/13  2:20 PM      Result Value Ref Range Status   Specimen Description BLOOD RIGHT THUMB   Final   Special Requests BOTTLES DRAWN AEROBIC AND ANAEROBIC 10CC   Final   Culture  Setup Time     Final   Value: 09/06/2013 19:16     Performed at  [facility]   Culture     Final   Value:        BLOOD CULTURE RECEIVED NO GROWTH TO DATE CULTURE WILL BE HELD FOR 5 DAYS BEFORE ISSUING A FINAL NEGATIVE REPORT     Performed at [facility]   Report Status PENDING   Incomplete  CULTURE, BLOOD (ROUTINE X 2)     Status: None   Collection Time    09/06/13  2:35 PM      Result Value Ref Range Status   Specimen Description BLOOD RIGHT HAND   Final   Special Requests BOTTLES DRAWN AEROBIC AND ANAEROBIC [facility]   Final   Culture  Setup Time     Final   Value: 09/06/2013 19:16     Performed at [facility]   Culture     Final   Value:        BLOOD CULTURE RECEIVED NO GROWTH TO DATE CULTURE WILL BE HELD FOR 5 DAYS BEFORE ISSUING A FINAL NEGATIVE REPORT     Performed at [facility]   Report Status PENDING   Incomplete  CLOSTRIDIUM DIFFICILE BY PCR     Status: None   Collection Time    09/08/13 11:25 AM      Result Value Ref Range Status   C difficile by pcr NEGATIVE  NEGATIVE Final     Assessment: 37 yof continues on broad-spectrum antibiotics for treatment of thigh abscess. Planned DC yesterday on PO cipro + doxy but discharge delayed. Pt did not receive her vanc dose last night so a trough today would be unreliable. Pt is afebrile and WBC is 12.4. Scr improved slightly.   Vanc 7/20>> 7/24, resume 7/27  Zosyn 7/20 >>  7/20 WCx >> NEG 7/20 BCx >> 1/2 CoNS (deemed contaminant) 7/20 UCx >> NG 7/22 BCx>>NG  Goal of Therapy:  Vancomycin trough level 15-20 mcg/ml  Plan:  1. Continue vanc + zosyn as ordered - consider starting PO antibiotics now instead of waiting until discharge 2. F/u renal fxn, C&S, clinical status and trough at [facility] if not DC'd soon  Lysle Pearl, PharmD, BCPS Pager # [phone] 09/10/2013 2:42 PM

## 2013-09-11 LAB — GLUCOSE, CAPILLARY
GLUCOSE-CAPILLARY: 101 mg/dL — AB (ref 70–99)
GLUCOSE-CAPILLARY: 93 mg/dL (ref 70–99)
Glucose-Capillary: 118 mg/dL — ABNORMAL HIGH (ref 70–99)
Glucose-Capillary: 90 mg/dL (ref 70–99)

## 2013-09-11 MED ORDER — LOPERAMIDE HCL 2 MG PO CAPS: 4.0000 mg | ORAL_CAPSULE | ORAL | Status: DC | PRN

## 2013-09-11 NOTE — Progress Notes (Addendum)
Per Gavin Pound[place]orah [facility] with [facility] LOG on Monday 09/13/13.   Per Clinical Social Worker (CSW) hand off report [facility] [place] [facility] Monday [facility]/13/13. [facility]. Per RN [name] [facility] and will call CSW back. CSW will continue to follow and assist as needed.   [name], LCSWA Weekend CSW [phone]

## 2013-09-11 NOTE — Progress Notes (Signed)
[facility]                                                                              Patient Demographics  [name], is a 38 y.o. female, DOB - 1975/12/28, MWU:[id number]  Admit date - 08/30/2013   Admitting Physician Elsie Stain, MD  Outpatient Primary MD for the patient is Barbette Merino, MD  LOS - 12   Chief Complaint  Patient presents with  . Wound Infection  . Fatigue  . Fever     HPI on 08/30/2013 38 yo female developed boil in left thigh one week ago. Presented with fever (Tm 102F), lethargy. Found to have Left leg abscess complicated by sepsis, hyperglycemia. Was taken to OR for I&D and remained on vent post op. She has hx of DM, HTN, asthma.  Interim history Patient had become lethargic after extubation. MRI of the brain was obtained showing embolic infarcts. Neurology was consulted TEE was found to be unremarkable with no vegetations. Neurology recommended a hypercoagulation panel as well as continuing aspirin. Patient has been on vancomycin and Zosyn for the thigh wound, which surgery is following.  Patient was to be discharged on 7/30.  However, she refused and wanted to leave AMA, but then changed her mind again and decided to go to SNF, however, lost bed offer.  Patient will remain inpatient until Monday, 09/13/2013. It seems that she has a bed offer in [facility].  Assessment & Plan   Multiple embolic infarcts -Found on MRI of the brain -CTA head and neck results are noted below, RICA with signs of chronic occlusion -Echocardiogram is unremarkable, TEE showed no vegetations. -Neurology was consulted and signed off. -Recommend continuing aspirin and attending hypercoagulation workup. -Cardiology was consulted for TEE, no signs of endocarditis -PT and OT were consulted and recommended nursing facility.  Left thigh wound status post surgical drainage and debridement of abscess -Blood cultures remain negative to date  -Urine culture  negative  -Wound culture shows no growth, Gram stain shows abundant gram-negative rods and moderate gram-positive cocci in pairs  -Continue vancomycin and Zosyn  -Patient will need to follow up with [facility] in 2-3 weeks  -She will need wet to dry dressing changes BID or TID if needed  Sepsis secondary to left thigh abscess -Resolved  Acute encephalopathy secondary to sepsis versus CVA -Improving, patient appears stable.  Acute respiratory failure secondary to sepsis with history of asthma -Resolved. Patient did require intubation during this hospitalization -Continue Pulmicort, PRN albuterol,brovana  Acute kidney injury -Likely secondary to sepsis and acute infarction -Improved, will continue to monitor  Hyponatremia -Resolved, patient was given IVF  Leukocytosis  -likely secondary to sepsis versus acute CVA -Improving and trending downward  Microcytic anemia -Baseline hemoglobin appears to be 13-14  Diabetes mellitus type 2 -Last HbA1c 15.4 -CBGs remain persistently high -Continue insulin sliding scale, resistant along with Lantus and CBG monitoring  High triglycerides -Triglycerides 550 -Continue gemfibrozil  Diarrhea -Patient had multiple episodes of diarrhea overnight and required flexiseal  -C. difficile PCR negative  -possibly secondary to patient's antibiotics  -Diarrhea appears to have slowed down -Will try Imodium  Morbid obesity  -Patient will need to discuss  with her primary care physician and diet and exercise plan once she has been cleared and her wound is healed.  Code Status: Full  Family Communication: Family at bedside  Disposition Plan: Admitted, pending placement  Time Spent in minutes   30 minutes  Procedures  I&D of left thigh abscess  TEE No LAA thrombus No PFO or ASD Normal LV function No valvular disease or sign of endocarditis or abscess  2D Echocardiogram Normal LV function; severe LVH; mild LAE. No source of embolus  CTA Angiogram  of the head and neck  09/05/2013  1. Occlusion of the intracranial right ICA. Multiple small collateral vessels in the region of the right ICA terminus is indicative of chronic occlusion. Right MCA is reconstituted new the MCA bifurcation, with slightly diminished distal flow compared to the left. Right ACA is mainly supplied via the anterior communicating artery. Right ICA is diffusely small in the neck, with moderate stenosis at its origin.  2. Moderate left carotid siphon atherosclerosis. Mild to moderate proximal right PCA stenosis. Mild to moderate attenuation of distal left PCA branch vessels.  3. 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  4. Dominant right vertebral artery without stenosis identified.  5. Small, acute bilateral infarcts as described on recent MRI.  Consults   Neurology Surgery  DVT Prophylaxis  SCDs  Lab Results  Component Value Date   PLT 432* 09/09/2013    Medications  Scheduled Meds: .  stroke: mapping our early stages of recovery book   Does not apply Once  . antiseptic oral rinse  15 mL Mouth Rinse QID  . arformoterol  15 mcg Nebulization Q12H  . aspirin EC  325 mg Oral Daily  . atorvastatin  20 mg Oral q1800  . budesonide (PULMICORT) nebulizer solution  0.25 mg Nebulization Q12H  . chlorhexidine  15 mL Mouth Rinse BID  . feeding supplement (PRO-STAT SUGAR FREE 64)  30 mL Oral BID WC  . gemfibrozil  600 mg Oral BID AC  . heparin  5,000 Units Subcutaneous 3 times per day  . insulin aspart  0-20 Units Subcutaneous TID WC  . insulin aspart  6 Units Subcutaneous TID WC  . insulin glargine  22 Units Subcutaneous Daily  . insulin starter kit- syringes  1 kit Other Once  . multivitamin with minerals  1 tablet Oral Daily  . piperacillin-tazobactam (ZOSYN)  IV  3.375 g Intravenous Q8H  . triamterene-hydrochlorothiazide  2 tablet Oral Daily  . vancomycin  1,500 mg Intravenous Q12H   Continuous  Infusions: . sodium chloride 10 mL/hr at 09/04/13 1734   PRN Meds:.acetaminophen, acetaminophen, albuterol, hydrALAZINE, sodium chloride, sodium chloride  Antibiotics    Anti-infectives   Start     Dose/Rate Route Frequency Ordered Stop   09/09/13 0000  ciprofloxacin (CIPRO) 500 MG tablet     500 mg Oral 2 times daily 09/09/13 1028     09/09/13 0000  doxycycline (VIBRAMYCIN) 50 MG capsule     50 mg Oral 2 times daily 09/09/13 1028     09/07/13 0100  vancomycin (VANCOCIN) 1,500 mg in sodium chloride 0.9 % 500 mL IVPB     1,500 mg 250 mL/hr over 120 Minutes Intravenous Every 12 hours 09/06/13 1144     09/06/13 1145  vancomycin (VANCOCIN) 2,500 mg in sodium chloride 0.9 % 500 mL IVPB     2,500 mg 250 mL/hr over 120 Minutes Intravenous  Once 09/06/13 1144 09/06/13 1646  09/02/13 0800  vancomycin (VANCOCIN) 1,250 mg in sodium chloride 0.9 % 250 mL IVPB  Status:  Discontinued     1,250 mg 166.7 mL/hr over 90 Minutes Intravenous Every 12 hours 09/01/13 1252 09/03/13 1007   09/01/13 1100  vancomycin (VANCOCIN) 1,750 mg in sodium chloride 0.9 % 500 mL IVPB  Status:  Discontinued     1,750 mg 250 mL/hr over 120 Minutes Intravenous Every 24 hours 08/31/13 1418 09/01/13 1252   08/31/13 2000  piperacillin-tazobactam (ZOSYN) IVPB 3.375 g     3.375 g 12.5 mL/hr over 240 Minutes Intravenous Every 8 hours 08/31/13 1026     08/31/13 1200  vancomycin (VANCOCIN) 1,750 mg in sodium chloride 0.9 % 500 mL IVPB     1,750 mg 250 mL/hr over 120 Minutes Intravenous  Once 08/31/13 1036 08/31/13 1303   08/31/13 1100  piperacillin-tazobactam (ZOSYN) IVPB 3.375 g     3.375 g 12.5 mL/hr over 240 Minutes Intravenous  Once 08/31/13 1026 08/31/13 1504   08/30/13 1515  [MAR Hold]  vancomycin (VANCOCIN) IVPB 1000 mg/200 mL premix     (On MAR Hold since 08/30/13 1616)   1,000 mg 200 mL/hr over 60 Minutes Intravenous  Once 08/30/13 1510 08/30/13 1647   08/30/13 1515  piperacillin-tazobactam (ZOSYN) IVPB 3.375 g      3.375 g 100 mL/hr over 30 Minutes Intravenous  Once 08/30/13 1510 08/30/13 1601        Subjective:   Marisa Cyphers seen and examined today. Patient has no complaints this morning.    Objective:   Filed Vitals:   09/10/13 1927 09/10/13 2132 09/11/13 0144 09/11/13 0509  BP:  161/76 156/76 148/81  Pulse:  104 102 95  Temp:  98.6 F (37 C) 98.4 F (36.9 C) 98.8 F (37.1 C)  TempSrc:  Oral Oral Oral  Resp:  $Remo'18 18 18  'DCtkK$ Height:      Weight:      SpO2: 99% 100% 99% 97%    Wt Readings from Last 3 Encounters:  09/02/13 144.8 kg (319 lb 3.6 oz)  09/02/13 144.8 kg (319 lb 3.6 oz)  09/02/13 144.8 kg (319 lb 3.6 oz)     Intake/Output Summary (Last 24 hours) at 09/11/13 1054 Last data filed at 09/11/13 0600  Gross per 24 hour  Intake      0 ml  Output    600 ml  Net   -600 ml    Exam  General: Well developed, well nourished, NAD, appears stated age  HEENT: NCAT, mucous membranes moist.   Neck: Supple, no JVD, no masses  Cardiovascular: S1 S2 auscultated, regular rate and rhythm.  Respiratory: Clear to auscultation bilaterally with equal chest rise  Abdomen: Soft, obese, nontender, nondistended, + bowel sounds  Neuro: Awake however very sleepy at this time. No focal deficits. All 4 extremities.  Data Review   Micro Results Recent Results (from the past 240 hour(s))  CULTURE, BLOOD (ROUTINE X 2)     Status: None   Collection Time    09/01/13 11:10 AM      Result Value Ref Range Status   Specimen Description BLOOD LEFT ANTECUBITAL   Final   Special Requests BOTTLES DRAWN AEROBIC ONLY [facility]   Final   Culture  Setup Time     Final   Value: 09/01/2013 14:01     Performed at [facility]   Culture     Final   Value: NO GROWTH 5 DAYS     Performed at  [facility]   Report Status 09/07/2013 FINAL   Final  CULTURE, BLOOD (ROUTINE X 2)     Status: None   Collection Time    09/01/13 11:20 AM      Result Value Ref Range Status   Specimen Description  BLOOD LEFT HAND   Final   Special Requests BOTTLES DRAWN AEROBIC ONLY [facility]   Final   Culture  Setup Time     Final   Value: 09/01/2013 14:01     Performed at [facility]   Culture     Final   Value: NO GROWTH 5 DAYS     Performed at [facility]   Report Status 09/07/2013 FINAL   Final  CULTURE, BLOOD (ROUTINE X 2)     Status: None   Collection Time    09/06/13  2:20 PM      Result Value Ref Range Status   Specimen Description BLOOD RIGHT THUMB   Final   Special Requests BOTTLES DRAWN AEROBIC AND ANAEROBIC 10CC   Final   Culture  Setup Time     Final   Value: 09/06/2013 19:16     Performed at [facility]   Culture     Final   Value:        BLOOD CULTURE RECEIVED NO GROWTH TO DATE CULTURE WILL BE HELD FOR 5 DAYS BEFORE ISSUING A FINAL NEGATIVE REPORT     Performed at [facility]   Report Status PENDING   Incomplete  CULTURE, BLOOD (ROUTINE X 2)     Status: None   Collection Time    09/06/13  2:35 PM      Result Value Ref Range Status   Specimen Description BLOOD RIGHT HAND   Final   Special Requests BOTTLES DRAWN AEROBIC AND ANAEROBIC [facility]   Final   Culture  Setup Time     Final   Value: 09/06/2013 19:16     Performed at [facility]   Culture     Final   Value:        BLOOD CULTURE RECEIVED NO GROWTH TO DATE CULTURE WILL BE HELD FOR 5 DAYS BEFORE ISSUING A FINAL NEGATIVE REPORT     Performed at [facility]   Report Status PENDING   Incomplete  CLOSTRIDIUM DIFFICILE BY PCR     Status: None   Collection Time    09/08/13 11:25 AM      Result Value Ref Range Status   C difficile by pcr NEGATIVE  NEGATIVE Final    Radiology Reports Ct Angio Head W/cm &/or Wo Cm  09/05/2013   CLINICAL DATA:  Diabetes, hypertension, and prior stroke with left lower extremity abscess and sepsis. Multiple, likely embolic infarcts on MRI. Stroke workup.  EXAM: CT ANGIOGRAPHY HEAD AND NECK  TECHNIQUE: Multidetector CT imaging of the head and neck was  performed using the standard protocol during bolus administration of intravenous contrast. Multiplanar CT image reconstructions and MIPs were obtained to evaluate the vascular anatomy. Carotid stenosis measurements (when applicable) are obtained utilizing NASCET criteria, using the distal internal carotid diameter as the denominator.  CONTRAST:  34mL OMNIPAQUE IOHEXOL 350 MG/ML SOLN  COMPARISON:  Brain MRI 09/03/2013  FINDINGS: CTA HEAD FINDINGS  Small foci of hypoattenuation are seen in the anterior body and genu of the corpus callosum, right globus pallidus, and left posterior left temporal periventricular white matter corresponding to acute infarcts described on recent MRI additional, punctate acute infarcts on  the MRI are not well demonstrated on the CT. Remote infarcts are noted in both cerebellar hemispheres and in the left basal ganglia/ corona radiata. There is no evidence of acute large territory cortical infarct, intracranial hemorrhage, midline shift, or extra-axial fluid collection. There is mild ex vacuo dilatation of the body of the left lateral ventricle. No abnormal enhancement is identified. Orbits are unremarkable. Mastoid air cells and paranasal sinuses are clear.  The intracranial right vertebral artery is patent, dominant, and supplies the basilar. The left intracranial vertebral artery is markedly small in caliber and only intermittently patent. PICA origins appear patent. AICA and SCA origins are patent. Basilar artery is mildly irregular but without stenosis. There is a focal, mild-to-moderate distal right P1 stenosis. There is irregular, mild to moderate attenuation of left P2 and P3 branches. Posterior communicating arteries are not clearly identified.  The proximal right intracranial ICA is diffusely small in caliber with a severe stenosis at the petrous-cavernous junction. The right ICA becomes occluded at the anterior cavernous segment. There is minimal to no reconstitution of the right  intracranial ICA distal to this. Multiple collateral vessels are seen in the expected region of the right ICA terminus. These collateral vessels reconstitute the right MCA near the MCA bifurcation. Major right M2 branches appear patent although mildly smaller in caliber than those on the left. A small amount of contrast is present in the right A1 segment, with the A2 being supplied predominantly by the left ACA via the anterior communicating artery, which appears somewhat bulbous.  The left internal carotid artery is patent from skullbase to carotid terminus. There is mild calcification and moderate irregularity of the left carotid siphon. There is mild narrowing of the left ICA at the petrous-cavernous junction. There is diffuse irregularity with mild to moderate tandem stenoses involving the anterior genu of the left cavernous carotid as well as the supraclinoid left ICA. There is mild stenosis of the proximal left A1 segment. There is also mild proximal left M1 stenosis. There is mild, diffuse irregularity of left M2 and more distal branches.  Review of the MIP images confirms the above findings.  CTA NECK FINDINGS  Three vessel aortic arch. Subclavian arteries are grossly unremarkable. Evaluation of the proximal common carotid arteries is limited by image noise due to shoulder attenuation as well as mild motion artifact. No mid or distal common carotid artery stenosis is seen. There is moderate focal narrowing of the right internal carotid artery at its origin. The right ICA obtains a more normal caliber for approximately 1 cm beyond the stenosis before becoming narrowed again and demonstrating a diffusely narrowed but smooth luminal contour for the remainder of its course in the neck. The right ICA lumen near its origin at the site of proximal stenosis measures approximately 2.3 mm in diameter, with the diffusely narrowed mid to distal right cervical ICA measuring approximately 2.7 mm in diameter. The left  cervical ICA is mildly tortuous but patent and without stenosis.  The proximal right vertebral artery is grossly patent but suboptimally evaluated due to artifact. The V2 and V3 segments of the right vertebral artery are normal in appearance. The left vertebral artery appears occluded at its origin with reconstitution at the C4 level, with the lumen of the vertebral artery being markedly small in caliber distal to this.  A left subclavian approach central venous catheter is partially visualized. Jugulodigastric lymph nodes measure up to 10 mm in short axis.  Review of the MIP images confirms the  above findings.  IMPRESSION: 1. Occlusion of the intracranial right ICA. Multiple small collateral vessels in the region of the right ICA terminus is indicative of chronic occlusion. Right MCA is reconstituted new the MCA bifurcation, with slightly diminished distal flow compared to the left. Right ACA is mainly supplied via the anterior communicating artery. Right ICA is diffusely small in the neck, with moderate stenosis at its origin. 2. Moderate left carotid siphon atherosclerosis. Mild to moderate proximal right PCA stenosis. Mild to moderate attenuation of distal left PCA branch vessels. 3. 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 4. Dominant right vertebral artery without stenosis identified. 5. Small, acute bilateral infarcts as described on recent MRI.   Electronically Signed   By: Logan Bores   On: 09/05/2013 20:48   Ct Angio Neck W/cm &/or Wo/cm  09/05/2013   CLINICAL DATA:  Diabetes, hypertension, and prior stroke with left lower extremity abscess and sepsis. Multiple, likely embolic infarcts on MRI. Stroke workup.  EXAM: CT ANGIOGRAPHY HEAD AND NECK  TECHNIQUE: Multidetector CT imaging of the head and neck was performed using the standard protocol during bolus administration of intravenous contrast. Multiplanar CT image reconstructions and MIPs  were obtained to evaluate the vascular anatomy. Carotid stenosis measurements (when applicable) are obtained utilizing NASCET criteria, using the distal internal carotid diameter as the denominator.  CONTRAST:  76mL OMNIPAQUE IOHEXOL 350 MG/ML SOLN  COMPARISON:  Brain MRI 09/03/2013  FINDINGS: CTA HEAD FINDINGS  Small foci of hypoattenuation are seen in the anterior body and genu of the corpus callosum, right globus pallidus, and left posterior left temporal periventricular white matter corresponding to acute infarcts described on recent MRI additional, punctate acute infarcts on the MRI are not well demonstrated on the CT. Remote infarcts are noted in both cerebellar hemispheres and in the left basal ganglia/ corona radiata. There is no evidence of acute large territory cortical infarct, intracranial hemorrhage, midline shift, or extra-axial fluid collection. There is mild ex vacuo dilatation of the body of the left lateral ventricle. No abnormal enhancement is identified. Orbits are unremarkable. Mastoid air cells and paranasal sinuses are clear.  The intracranial right vertebral artery is patent, dominant, and supplies the basilar. The left intracranial vertebral artery is markedly small in caliber and only intermittently patent. PICA origins appear patent. AICA and SCA origins are patent. Basilar artery is mildly irregular but without stenosis. There is a focal, mild-to-moderate distal right P1 stenosis. There is irregular, mild to moderate attenuation of left P2 and P3 branches. Posterior communicating arteries are not clearly identified.  The proximal right intracranial ICA is diffusely small in caliber with a severe stenosis at the petrous-cavernous junction. The right ICA becomes occluded at the anterior cavernous segment. There is minimal to no reconstitution of the right intracranial ICA distal to this. Multiple collateral vessels are seen in the expected region of the right ICA terminus. These collateral  vessels reconstitute the right MCA near the MCA bifurcation. Major right M2 branches appear patent although mildly smaller in caliber than those on the left. A small amount of contrast is present in the right A1 segment, with the A2 being supplied predominantly by the left ACA via the anterior communicating artery, which appears somewhat bulbous.  The left internal carotid artery is patent from skullbase to carotid terminus. There is mild calcification and moderate irregularity of the left carotid siphon. There is mild narrowing of the left ICA at the petrous-cavernous junction. There is  diffuse irregularity with mild to moderate tandem stenoses involving the anterior genu of the left cavernous carotid as well as the supraclinoid left ICA. There is mild stenosis of the proximal left A1 segment. There is also mild proximal left M1 stenosis. There is mild, diffuse irregularity of left M2 and more distal branches.  Review of the MIP images confirms the above findings.  CTA NECK FINDINGS  Three vessel aortic arch. Subclavian arteries are grossly unremarkable. Evaluation of the proximal common carotid arteries is limited by image noise due to shoulder attenuation as well as mild motion artifact. No mid or distal common carotid artery stenosis is seen. There is moderate focal narrowing of the right internal carotid artery at its origin. The right ICA obtains a more normal caliber for approximately 1 cm beyond the stenosis before becoming narrowed again and demonstrating a diffusely narrowed but smooth luminal contour for the remainder of its course in the neck. The right ICA lumen near its origin at the site of proximal stenosis measures approximately 2.3 mm in diameter, with the diffusely narrowed mid to distal right cervical ICA measuring approximately 2.7 mm in diameter. The left cervical ICA is mildly tortuous but patent and without stenosis.  The proximal right vertebral artery is grossly patent but suboptimally  evaluated due to artifact. The V2 and V3 segments of the right vertebral artery are normal in appearance. The left vertebral artery appears occluded at its origin with reconstitution at the C4 level, with the lumen of the vertebral artery being markedly small in caliber distal to this.  A left subclavian approach central venous catheter is partially visualized. Jugulodigastric lymph nodes measure up to 10 mm in short axis.  Review of the MIP images confirms the above findings.  IMPRESSION: 1. Occlusion of the intracranial right ICA. Multiple small collateral vessels in the region of the right ICA terminus is indicative of chronic occlusion. Right MCA is reconstituted new the MCA bifurcation, with slightly diminished distal flow compared to the left. Right ACA is mainly supplied via the anterior communicating artery. Right ICA is diffusely small in the neck, with moderate stenosis at its origin. 2. Moderate left carotid siphon atherosclerosis. Mild to moderate proximal right PCA stenosis. Mild to moderate attenuation of distal left PCA branch vessels. 3. 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 4. Dominant right vertebral artery without stenosis identified. 5. Small, acute bilateral infarcts as described on recent MRI.   Electronically Signed   By: Sebastian Ache   On: 09/05/2013 20:48   Mr Brain Wo Contrast  09/03/2013   CLINICAL DATA:  Change in level of consciousness. Rule out stroke. Thigh abscess with sepsis.  EXAM: MRI HEAD WITHOUT CONTRAST  TECHNIQUE: Multiplanar, multiecho pulse sequences of the brain and surrounding structures were obtained without intravenous contrast.  COMPARISON:  None.  FINDINGS: Images are mildly to moderately degraded by motion. There are numerous punctate foci of restricted diffusion involving cortex and white matter of both cerebral hemispheres consistent with acute infarcts. Several slightly larger foci of acute  infarction are present in the corpus callosum measuring up to approximately 1.3 cm in size. Small, acute infarcts are also present in the right greater than left basal ganglia, and there is a punctate acute infarct anteriorly in the right aspect of the medulla. There may be a small amount of hemorrhage associated with a few of these punctate infarcts. Old infarcts are present in the bilateral basal ganglia and bilateral cerebellum.  There is no mass, midline shift, or extra-axial fluid collection. There is mild cerebral and cerebellar atrophy. Orbits are unremarkable. Minimal ethmoid air cell mucosal thickening is noted. Mastoid air cells are clear.  IMPRESSION: 1. Multiple small infarcts involving both cerebral hemispheres, corpus callosum, and medulla, consistent with embolic infarcts. These could be bland or septic emboli given history. 2. 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   Electronically Signed   By: Logan Bores   On: 09/03/2013 17:38   Dg Chest Port 1 View  09/05/2013   CLINICAL DATA:  Left-sided PICC placement.  EXAM: PORTABLE CHEST - 1 VIEW  COMPARISON:  Chest x-ray 08/31/2013.  FINDINGS: There is a left upper extremity PICC with tip terminating in the superior aspect of the right atrium (approximately 3 cm beyond the superior cavoatrial junction). No pneumothorax. No acute consolidative airspace disease. No pleural effusions. No evidence of pulmonary edema. Mild cardiomegaly. Upper mediastinal contours are within normal limits.  IMPRESSION: 1. Tip of PICC is in the right atrium approximately 3 cm distal to the superior cavoatrial junction. 2. Status post extubation with significantly improved aeration. 3. Mild cardiomegaly.   Electronically Signed   By: Vinnie Langton M.D.   On: 09/05/2013 14:21   Portable Chest Xray In Am  08/31/2013   CLINICAL DATA:  Respiratory failure.  EXAM: PORTABLE CHEST - 1 VIEW  COMPARISON:  08/30/2013.  FINDINGS: The endotracheal tube is 4 cm above the carina.  The NG tube is coursing down the esophagus and into the stomach. The heart is enlarged but unchanged. Persistent mild perihilar edema and probable small effusions.  IMPRESSION: Stable support apparatus.  Persistent perihilar edema and areas of atelectasis.   Electronically Signed   By: Kalman Jewels M.D.   On: 08/31/2013 07:29   Dg Chest Port 1 View  08/30/2013   CLINICAL DATA:  Endotracheal tube placement.  EXAM: PORTABLE CHEST - 1 VIEW  COMPARISON:  08/30/2013 and 02/21/2013.  FINDINGS: 2214 hr. Endotracheal tube appears unchanged, within the mid trachea. There is improved aeration of the lungs with residual patchy left greater than right basilar airspace opacities. There is no pleural effusion or pneumothorax. The heart size and mediastinal contours are stable.  IMPRESSION: Interval improved aeration of the lung bases. Endotracheal tube appears unchanged within the midtrachea.   Electronically Signed   By: Camie Patience M.D.   On: 08/30/2013 23:16   Portable Chest Xray  08/30/2013   CLINICAL DATA:  Endotracheal tube placement  EXAM: PORTABLE CHEST - 1 VIEW  COMPARISON:  Chest radiograph 02/21/2013  FINDINGS: Markedly low lung volumes limit evaluation. The endotracheal tube terminates within the distal trachea approximately 5 mm superior to the carina. Cardiomegaly. Heterogeneous lower lung opacities, left greater than right. No pleural effusion or pneumothorax.  IMPRESSION: Endotracheal tube terminates within the distal trachea. Recommend retraction and re-evaluation with repeat radiograph.  Scattered heterogeneous opacities favored to represent atelectasis in the setting of markedly low lung volumes.  Discussed with Nurse Reece Leader at 8:43 p.m. on 08/30/2013.   Electronically Signed   By: Lovey Newcomer M.D.   On: 08/30/2013 20:44   Dg Abd Portable 1v  08/31/2013   CLINICAL DATA:  Enteric tube placement.  EXAM: PORTABLE ABDOMEN - 1 VIEW  COMPARISON:  Lumbar spine radiographs performed 09/26/2011   FINDINGS: The patient's enteric tube is seen ending at the body of the stomach, with the sideport at the fundus of the stomach.  The visualized bowel gas pattern is grossly unremarkable,  with scattered air noted in the colon. No free intra-abdominal air is identified, though evaluation for free air is limited on a single supine view and due to motion artifact. No acute osseous abnormalities are seen.  IMPRESSION: Enteric tube is seen ending at the body of the stomach, with the sideport at the fundus of the stomach.   Electronically Signed   By: Garald Balding M.D.   On: 08/31/2013 05:45    CBC  Recent Labs Lab 09/06/13 0930 09/07/13 0525 09/08/13 0600 09/09/13 0550  WBC 14.6* 13.2* 12.7* 12.4*  HGB 9.4* 8.6* 8.8* 8.9*  HCT 28.5* 26.8* 27.1* 27.7*  PLT 406* 394 427* 432*  MCV 81.7 82.2 82.1 82.7  MCH 26.9 26.4 26.7 26.6  MCHC 33.0 32.1 32.5 32.1  RDW 14.3 14.3 14.6 14.6    Chemistries   Recent Labs Lab 09/05/13 0549 09/06/13 0930 09/07/13 0525 09/08/13 0600 09/09/13 0550  NA 138 134* 137 138 137  K 3.7 4.2 4.0 3.9 4.3  CL 96 100 105 104 107  CO2 $Re'23 22 21 20 20  'plg$ GLUCOSE 152* 235* 100* 114* 140*  BUN $Re'11 23 20 19 16  'Xbg$ CREATININE 0.75 1.09 1.25* 1.27* 1.17*  CALCIUM 8.8 8.0* 7.8* 8.0* 8.0*   ------------------------------------------------------------------------------------------------------------------ estimated creatinine clearance is 90.9 ml/min (by C-G formula based on Cr of 1.17). ------------------------------------------------------------------------------------------------------------------ No results found for this basename: HGBA1C,  in the last 72 hours ------------------------------------------------------------------------------------------------------------------ No results found for this basename: CHOL, HDL, LDLCALC, TRIG, CHOLHDL, LDLDIRECT,  in the last 72  hours ------------------------------------------------------------------------------------------------------------------ No results found for this basename: TSH, T4TOTAL, FREET3, T3FREE, THYROIDAB,  in the last 72 hours ------------------------------------------------------------------------------------------------------------------ No results found for this basename: VITAMINB12, FOLATE, FERRITIN, TIBC, IRON, RETICCTPCT,  in the last 72 hours  Coagulation profile No results found for this basename: INR, PROTIME,  in the last 168 hours  No results found for this basename: DDIMER,  in the last 72 hours  Cardiac Enzymes No results found for this basename: CK, CKMB, TROPONINI, MYOGLOBIN,  in the last 168 hours ------------------------------------------------------------------------------------------------------------------ No components found with this basename: POCBNP,     Annastyn Silvey D.O. on 09/11/2013 at 10:54 AM  Between 7am to 7pm - Pager - [phone]  After 7pm go to [email or web address] - password [email or web address]  And look for the night coverage person covering for me after hours  [facility] Office  [phone]

## 2013-09-12 LAB — GLUCOSE, CAPILLARY
GLUCOSE-CAPILLARY: 77 mg/dL (ref 70–99)
GLUCOSE-CAPILLARY: 78 mg/dL (ref 70–99)
Glucose-Capillary: 103 mg/dL — ABNORMAL HIGH (ref 70–99)
Glucose-Capillary: 130 mg/dL — ABNORMAL HIGH (ref 70–99)

## 2013-09-12 LAB — CULTURE, BLOOD (ROUTINE X 2)
CULTURE: NO GROWTH
Culture: NO GROWTH

## 2013-09-12 MED ORDER — HYDROCODONE-ACETAMINOPHEN 5-325 MG PO TABS
1.0000 | ORAL_TABLET | Freq: Four times a day (QID) | ORAL | Status: DC | PRN
  Administered 2013-09-12 – 2013-09-13 (×2): 1 via ORAL
  Filled 2013-09-12 (×2): qty 1

## 2013-09-12 MED ORDER — CHOLESTYRAMINE 4 G PO PACK
4.0000 g | PACK | Freq: Once | ORAL | Status: AC
  Administered 2013-09-12: 4 g via ORAL
  Filled 2013-09-12 (×2): qty 1

## 2013-09-12 NOTE — Progress Notes (Addendum)
[facility]                                                                              Patient Demographics  [name], is a 38 y.o. female, DOB - 1975/12/07, JOA:[id number]  Admit date - 08/30/2013   Admitting Physician Elsie Stain, MD  Outpatient Primary MD for the patient is Barbette Merino, MD  LOS - 13   Chief Complaint  Patient presents with  . Wound Infection  . Fatigue  . Fever     HPI on 08/30/2013 38 yo female developed boil in left thigh one week ago. Presented with fever (Tm 102F), lethargy. Found to have Left leg abscess complicated by sepsis, hyperglycemia. Was taken to OR for I&D and remained on vent post op. She has hx of DM, HTN, asthma.  Interim history Patient had become lethargic after extubation. MRI of the brain was obtained showing embolic infarcts. Neurology was consulted TEE was found to be unremarkable with no vegetations. Neurology recommended a hypercoagulation panel as well as continuing aspirin. Patient has been on vancomycin and Zosyn for the thigh wound, which surgery is following.  Patient was to be discharged on 7/30.  However, she refused and wanted to leave AMA, but then changed her mind again and decided to go to SNF, however, lost bed offer.  Patient will remain inpatient until Monday, 09/13/2013. It seems that she has a bed offer in [facility].  Patient's husband and sister have been rude and irate with nursing staff. Yesterday evening, 09/11/2013, security was called for assistance.  Assessment & Plan   Multiple embolic infarcts -Found on MRI of the brain -CTA head and neck results are noted below, RICA with signs of chronic occlusion -Echocardiogram is unremarkable, TEE showed no vegetations. -Neurology was consulted and signed off. -Recommend continuing aspirin and attending hypercoagulation workup. -Cardiology was consulted for TEE, no signs of endocarditis -PT and OT were consulted and recommended nursing  facility.  Left thigh wound status post surgical drainage and debridement of abscess -Blood cultures remain negative to date  -Urine culture negative  -Wound culture shows no growth, Gram stain shows abundant gram-negative rods and moderate gram-positive cocci in pairs  -Continue vancomycin and Zosyn  -Patient will need to follow up with [facility] in 2-3 weeks  -She will need wet to dry dressing changes BID or TID if needed  Sepsis secondary to left thigh abscess -Resolved  Acute encephalopathy secondary to sepsis versus CVA -Improving, patient appears stable.  Acute respiratory failure secondary to sepsis with history of asthma -Resolved. Patient did require intubation during this hospitalization -Continue Pulmicort, PRN albuterol,brovana  Acute kidney injury -Likely secondary to sepsis and acute infarction -Improved, will continue to monitor  Hyponatremia -Resolved, patient was given IVF  Leukocytosis  -likely secondary to sepsis versus acute CVA -Improving and trending downward  Microcytic anemia -Baseline hemoglobin appears to be 13-14  Diabetes mellitus type 2 -Last HbA1c 15.4 -CBGs remain persistently high -Continue insulin sliding scale, resistant along with Lantus and CBG monitoring  High triglycerides -Triglycerides 550 -Continue gemfibrozil  Diarrhea -Patient had multiple episodes of diarrhea overnight and required flexiseal  -C. difficile PCR negative  -possibly secondary to  patient's antibiotics  -Diarrhea appears to have slowed down -Will give one dose of cholestyramine  Morbid obesity  -Patient will need to discuss with her primary care physician and diet and exercise plan once she has been cleared and her wound is healed.  Code Status: Full  Family Communication: Family at bedside  Disposition Plan: Admitted, pending placement  Time Spent in minutes   15 minutes  Procedures  I&D of left thigh abscess  TEE No LAA thrombus No PFO or ASD Normal LV  function No valvular disease or sign of endocarditis or abscess  2D Echocardiogram Normal LV function; severe LVH; mild LAE. No source of embolus  CTA Angiogram of the head and neck  09/05/2013  1. Occlusion of the intracranial right ICA. Multiple small collateral vessels in the region of the right ICA terminus is indicative of chronic occlusion. Right MCA is reconstituted new the MCA bifurcation, with slightly diminished distal flow compared to the left. Right ACA is mainly supplied via the anterior communicating artery. Right ICA is diffusely small in the neck, with moderate stenosis at its origin.  2. Moderate left carotid siphon atherosclerosis. Mild to moderate proximal right PCA stenosis. Mild to moderate attenuation of distal left PCA branch vessels.  3. 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  4. Dominant right vertebral artery without stenosis identified.  5. Small, acute bilateral infarcts as described on recent MRI.  Consults   Neurology Surgery  DVT Prophylaxis  SCDs  Lab Results  Component Value Date   PLT 432* 09/09/2013    Medications  Scheduled Meds: .  stroke: mapping our early stages of recovery book   Does not apply Once  . antiseptic oral rinse  15 mL Mouth Rinse QID  . arformoterol  15 mcg Nebulization Q12H  . aspirin EC  325 mg Oral Daily  . atorvastatin  20 mg Oral q1800  . budesonide (PULMICORT) nebulizer solution  0.25 mg Nebulization Q12H  . chlorhexidine  15 mL Mouth Rinse BID  . feeding supplement (PRO-STAT SUGAR FREE 64)  30 mL Oral BID WC  . gemfibrozil  600 mg Oral BID AC  . heparin  5,000 Units Subcutaneous 3 times per day  . insulin aspart  0-20 Units Subcutaneous TID WC  . insulin aspart  6 Units Subcutaneous TID WC  . insulin glargine  22 Units Subcutaneous Daily  . insulin starter kit- syringes  1 kit Other Once  . multivitamin with minerals  1 tablet Oral Daily  .  piperacillin-tazobactam (ZOSYN)  IV  3.375 g Intravenous Q8H  . triamterene-hydrochlorothiazide  2 tablet Oral Daily  . vancomycin  1,500 mg Intravenous Q12H   Continuous Infusions: . sodium chloride 10 mL/hr at 09/04/13 1734   PRN Meds:.acetaminophen, acetaminophen, albuterol, hydrALAZINE, loperamide, sodium chloride, sodium chloride  Antibiotics    Anti-infectives   Start     Dose/Rate Route Frequency Ordered Stop   09/09/13 0000  ciprofloxacin (CIPRO) 500 MG tablet     500 mg Oral 2 times daily 09/09/13 1028     09/09/13 0000  doxycycline (VIBRAMYCIN) 50 MG capsule     50 mg Oral 2 times daily 09/09/13 1028     09/07/13 0100  vancomycin (VANCOCIN) 1,500 mg in sodium chloride 0.9 % 500 mL IVPB     1,500 mg 250 mL/hr over 120 Minutes Intravenous Every 12 hours 09/06/13 1144     09/06/13 1145  vancomycin (VANCOCIN) 2,500 mg in sodium  chloride 0.9 % 500 mL IVPB     2,500 mg 250 mL/hr over 120 Minutes Intravenous  Once 09/06/13 1144 09/06/13 1646   09/02/13 0800  vancomycin (VANCOCIN) 1,250 mg in sodium chloride 0.9 % 250 mL IVPB  Status:  Discontinued     1,250 mg 166.7 mL/hr over 90 Minutes Intravenous Every 12 hours 09/01/13 1252 09/03/13 1007   09/01/13 1100  vancomycin (VANCOCIN) 1,750 mg in sodium chloride 0.9 % 500 mL IVPB  Status:  Discontinued     1,750 mg 250 mL/hr over 120 Minutes Intravenous Every 24 hours 08/31/13 1418 09/01/13 1252   08/31/13 2000  piperacillin-tazobactam (ZOSYN) IVPB 3.375 g     3.375 g 12.5 mL/hr over 240 Minutes Intravenous Every 8 hours 08/31/13 1026     08/31/13 1200  vancomycin (VANCOCIN) 1,750 mg in sodium chloride 0.9 % 500 mL IVPB     1,750 mg 250 mL/hr over 120 Minutes Intravenous  Once 08/31/13 1036 08/31/13 1303   08/31/13 1100  piperacillin-tazobactam (ZOSYN) IVPB 3.375 g     3.375 g 12.5 mL/hr over 240 Minutes Intravenous  Once 08/31/13 1026 08/31/13 1504   08/30/13 1515  [MAR Hold]  vancomycin (VANCOCIN) IVPB 1000 mg/200 mL premix      (On MAR Hold since 08/30/13 1616)   1,000 mg 200 mL/hr over 60 Minutes Intravenous  Once 08/30/13 1510 08/30/13 1647   08/30/13 1515  piperacillin-tazobactam (ZOSYN) IVPB 3.375 g     3.375 g 100 mL/hr over 30 Minutes Intravenous  Once 08/30/13 1510 08/30/13 1601        Subjective:   [name] seen and examined today. Patient complains of thigh pain. Currently being bathed by staff.  She denies headache, chest pain, shortness of breath, abdominal pain.  Objective:   Filed Vitals:   09/11/13 1845 09/11/13 2127 09/12/13 0205 09/12/13 0511  BP: 152/61 154/79 154/64 156/86  Pulse: 99 99 94 95  Temp:  98.7 F (37.1 C)  98.9 F (37.2 C)  TempSrc:  Oral Axillary Oral  Resp:  $Remo'18 16 18  'avKfZ$ Height:      Weight:      SpO2:  100% 99% 98%    Wt Readings from Last 3 Encounters:  09/02/13 144.8 kg (319 lb 3.6 oz)  09/02/13 144.8 kg (319 lb 3.6 oz)  09/02/13 144.8 kg (319 lb 3.6 oz)     Intake/Output Summary (Last 24 hours) at 09/12/13 0917 Last data filed at 09/11/13 1900  Gross per 24 hour  Intake     90 ml  Output      2 ml  Net     88 ml    Exam  General: Well developed, well nourished, NAD, appears stated age  HEENT: NCAT, mucous membranes moist.   Neck: Supple, no JVD, no masses  Cardiovascular: S1 S2 auscultated, regular rate and rhythm.  Respiratory: Clear to auscultation bilaterally with equal chest rise  Abdomen: Soft, obese, nontender, nondistended, + bowel sounds  Neuro: Awake however very sleepy at this time. No focal deficits. All 4 extremities.  Skin: dressing on left thigh removed, wound looks clean  Data Review   Micro Results Recent Results (from the past 240 hour(s))  CULTURE, BLOOD (ROUTINE X 2)     Status: None   Collection Time    09/06/13  2:20 PM      Result Value Ref Range Status   Specimen Description BLOOD RIGHT THUMB   Final   Special Requests BOTTLES DRAWN AEROBIC AND  ANAEROBIC 10CC   Final   Culture  Setup Time     Final    Value: 09/06/2013 19:16     Performed at [facility]   Culture     Final   Value:        BLOOD CULTURE RECEIVED NO GROWTH TO DATE CULTURE WILL BE HELD FOR 5 DAYS BEFORE ISSUING A FINAL NEGATIVE REPORT     Performed at [facility]   Report Status PENDING   Incomplete  CULTURE, BLOOD (ROUTINE X 2)     Status: None   Collection Time    09/06/13  2:35 PM      Result Value Ref Range Status   Specimen Description BLOOD RIGHT HAND   Final   Special Requests BOTTLES DRAWN AEROBIC AND ANAEROBIC [facility]   Final   Culture  Setup Time     Final   Value: 09/06/2013 19:16     Performed at [facility]   Culture     Final   Value:        BLOOD CULTURE RECEIVED NO GROWTH TO DATE CULTURE WILL BE HELD FOR 5 DAYS BEFORE ISSUING A FINAL NEGATIVE REPORT     Performed at [facility]   Report Status PENDING   Incomplete  CLOSTRIDIUM DIFFICILE BY PCR     Status: None   Collection Time    09/08/13 11:25 AM      Result Value Ref Range Status   C difficile by pcr NEGATIVE  NEGATIVE Final    Radiology Reports Ct Angio Head W/cm &/or Wo Cm  09/05/2013   CLINICAL DATA:  Diabetes, hypertension, and prior stroke with left lower extremity abscess and sepsis. Multiple, likely embolic infarcts on MRI. Stroke workup.  EXAM: CT ANGIOGRAPHY HEAD AND NECK  TECHNIQUE: Multidetector CT imaging of the head and neck was performed using the standard protocol during bolus administration of intravenous contrast. Multiplanar CT image reconstructions and MIPs were obtained to evaluate the vascular anatomy. Carotid stenosis measurements (when applicable) are obtained utilizing NASCET criteria, using the distal internal carotid diameter as the denominator.  CONTRAST:  76mL OMNIPAQUE IOHEXOL 350 MG/ML SOLN  COMPARISON:  Brain MRI 09/03/2013  FINDINGS: CTA HEAD FINDINGS  Small foci of hypoattenuation are seen in the anterior body and genu of the corpus callosum, right globus pallidus, and left posterior left  temporal periventricular white matter corresponding to acute infarcts described on recent MRI additional, punctate acute infarcts on the MRI are not well demonstrated on the CT. Remote infarcts are noted in both cerebellar hemispheres and in the left basal ganglia/ corona radiata. There is no evidence of acute large territory cortical infarct, intracranial hemorrhage, midline shift, or extra-axial fluid collection. There is mild ex vacuo dilatation of the body of the left lateral ventricle. No abnormal enhancement is identified. Orbits are unremarkable. Mastoid air cells and paranasal sinuses are clear.  The intracranial right vertebral artery is patent, dominant, and supplies the basilar. The left intracranial vertebral artery is markedly small in caliber and only intermittently patent. PICA origins appear patent. AICA and SCA origins are patent. Basilar artery is mildly irregular but without stenosis. There is a focal, mild-to-moderate distal right P1 stenosis. There is irregular, mild to moderate attenuation of left P2 and P3 branches. Posterior communicating arteries are not clearly identified.  The proximal right intracranial ICA is diffusely small in caliber with a severe stenosis at the petrous-cavernous junction. The right ICA becomes occluded at the anterior cavernous  segment. There is minimal to no reconstitution of the right intracranial ICA distal to this. Multiple collateral vessels are seen in the expected region of the right ICA terminus. These collateral vessels reconstitute the right MCA near the MCA bifurcation. Major right M2 branches appear patent although mildly smaller in caliber than those on the left. A small amount of contrast is present in the right A1 segment, with the A2 being supplied predominantly by the left ACA via the anterior communicating artery, which appears somewhat bulbous.  The left internal carotid artery is patent from skullbase to carotid terminus. There is mild  calcification and moderate irregularity of the left carotid siphon. There is mild narrowing of the left ICA at the petrous-cavernous junction. There is diffuse irregularity with mild to moderate tandem stenoses involving the anterior genu of the left cavernous carotid as well as the supraclinoid left ICA. There is mild stenosis of the proximal left A1 segment. There is also mild proximal left M1 stenosis. There is mild, diffuse irregularity of left M2 and more distal branches.  Review of the MIP images confirms the above findings.  CTA NECK FINDINGS  Three vessel aortic arch. Subclavian arteries are grossly unremarkable. Evaluation of the proximal common carotid arteries is limited by image noise due to shoulder attenuation as well as mild motion artifact. No mid or distal common carotid artery stenosis is seen. There is moderate focal narrowing of the right internal carotid artery at its origin. The right ICA obtains a more normal caliber for approximately 1 cm beyond the stenosis before becoming narrowed again and demonstrating a diffusely narrowed but smooth luminal contour for the remainder of its course in the neck. The right ICA lumen near its origin at the site of proximal stenosis measures approximately 2.3 mm in diameter, with the diffusely narrowed mid to distal right cervical ICA measuring approximately 2.7 mm in diameter. The left cervical ICA is mildly tortuous but patent and without stenosis.  The proximal right vertebral artery is grossly patent but suboptimally evaluated due to artifact. The V2 and V3 segments of the right vertebral artery are normal in appearance. The left vertebral artery appears occluded at its origin with reconstitution at the C4 level, with the lumen of the vertebral artery being markedly small in caliber distal to this.  A left subclavian approach central venous catheter is partially visualized. Jugulodigastric lymph nodes measure up to 10 mm in short axis.  Review of the MIP  images confirms the above findings.  IMPRESSION: 1. Occlusion of the intracranial right ICA. Multiple small collateral vessels in the region of the right ICA terminus is indicative of chronic occlusion. Right MCA is reconstituted new the MCA bifurcation, with slightly diminished distal flow compared to the left. Right ACA is mainly supplied via the anterior communicating artery. Right ICA is diffusely small in the neck, with moderate stenosis at its origin. 2. Moderate left carotid siphon atherosclerosis. Mild to moderate proximal right PCA stenosis. Mild to moderate attenuation of distal left PCA branch vessels. 3. 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 4. Dominant right vertebral artery without stenosis identified. 5. Small, acute bilateral infarcts as described on recent MRI.   Electronically Signed   By: Sebastian Ache   On: 09/05/2013 20:48   Ct Angio Neck W/cm &/or Wo/cm  09/05/2013   CLINICAL DATA:  Diabetes, hypertension, and prior stroke with left lower extremity abscess and sepsis. Multiple, likely embolic infarcts on MRI. Stroke workup.  EXAM: CT ANGIOGRAPHY HEAD AND NECK  TECHNIQUE: Multidetector CT imaging of the head and neck was performed using the standard protocol during bolus administration of intravenous contrast. Multiplanar CT image reconstructions and MIPs were obtained to evaluate the vascular anatomy. Carotid stenosis measurements (when applicable) are obtained utilizing NASCET criteria, using the distal internal carotid diameter as the denominator.  CONTRAST:  51mL OMNIPAQUE IOHEXOL 350 MG/ML SOLN  COMPARISON:  Brain MRI 09/03/2013  FINDINGS: CTA HEAD FINDINGS  Small foci of hypoattenuation are seen in the anterior body and genu of the corpus callosum, right globus pallidus, and left posterior left temporal periventricular white matter corresponding to acute infarcts described on recent MRI additional, punctate acute infarcts  on the MRI are not well demonstrated on the CT. Remote infarcts are noted in both cerebellar hemispheres and in the left basal ganglia/ corona radiata. There is no evidence of acute large territory cortical infarct, intracranial hemorrhage, midline shift, or extra-axial fluid collection. There is mild ex vacuo dilatation of the body of the left lateral ventricle. No abnormal enhancement is identified. Orbits are unremarkable. Mastoid air cells and paranasal sinuses are clear.  The intracranial right vertebral artery is patent, dominant, and supplies the basilar. The left intracranial vertebral artery is markedly small in caliber and only intermittently patent. PICA origins appear patent. AICA and SCA origins are patent. Basilar artery is mildly irregular but without stenosis. There is a focal, mild-to-moderate distal right P1 stenosis. There is irregular, mild to moderate attenuation of left P2 and P3 branches. Posterior communicating arteries are not clearly identified.  The proximal right intracranial ICA is diffusely small in caliber with a severe stenosis at the petrous-cavernous junction. The right ICA becomes occluded at the anterior cavernous segment. There is minimal to no reconstitution of the right intracranial ICA distal to this. Multiple collateral vessels are seen in the expected region of the right ICA terminus. These collateral vessels reconstitute the right MCA near the MCA bifurcation. Major right M2 branches appear patent although mildly smaller in caliber than those on the left. A small amount of contrast is present in the right A1 segment, with the A2 being supplied predominantly by the left ACA via the anterior communicating artery, which appears somewhat bulbous.  The left internal carotid artery is patent from skullbase to carotid terminus. There is mild calcification and moderate irregularity of the left carotid siphon. There is mild narrowing of the left ICA at the petrous-cavernous  junction. There is diffuse irregularity with mild to moderate tandem stenoses involving the anterior genu of the left cavernous carotid as well as the supraclinoid left ICA. There is mild stenosis of the proximal left A1 segment. There is also mild proximal left M1 stenosis. There is mild, diffuse irregularity of left M2 and more distal branches.  Review of the MIP images confirms the above findings.  CTA NECK FINDINGS  Three vessel aortic arch. Subclavian arteries are grossly unremarkable. Evaluation of the proximal common carotid arteries is limited by image noise due to shoulder attenuation as well as mild motion artifact. No mid or distal common carotid artery stenosis is seen. There is moderate focal narrowing of the right internal carotid artery at its origin. The right ICA obtains a more normal caliber for approximately 1 cm beyond the stenosis before becoming narrowed again and demonstrating a diffusely narrowed but smooth luminal contour for the remainder of its course in the neck. The right ICA lumen near its origin at the site of proximal stenosis measures approximately  2.3 mm in diameter, with the diffusely narrowed mid to distal right cervical ICA measuring approximately 2.7 mm in diameter. The left cervical ICA is mildly tortuous but patent and without stenosis.  The proximal right vertebral artery is grossly patent but suboptimally evaluated due to artifact. The V2 and V3 segments of the right vertebral artery are normal in appearance. The left vertebral artery appears occluded at its origin with reconstitution at the C4 level, with the lumen of the vertebral artery being markedly small in caliber distal to this.  A left subclavian approach central venous catheter is partially visualized. Jugulodigastric lymph nodes measure up to 10 mm in short axis.  Review of the MIP images confirms the above findings.  IMPRESSION: 1. Occlusion of the intracranial right ICA. Multiple small collateral vessels in the  region of the right ICA terminus is indicative of chronic occlusion. Right MCA is reconstituted new the MCA bifurcation, with slightly diminished distal flow compared to the left. Right ACA is mainly supplied via the anterior communicating artery. Right ICA is diffusely small in the neck, with moderate stenosis at its origin. 2. Moderate left carotid siphon atherosclerosis. Mild to moderate proximal right PCA stenosis. Mild to moderate attenuation of distal left PCA branch vessels. 3. 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 4. Dominant right vertebral artery without stenosis identified. 5. Small, acute bilateral infarcts as described on recent MRI.   Electronically Signed   By: Logan Bores   On: 09/05/2013 20:48   Mr Brain Wo Contrast  09/03/2013   CLINICAL DATA:  Change in level of consciousness. Rule out stroke. Thigh abscess with sepsis.  EXAM: MRI HEAD WITHOUT CONTRAST  TECHNIQUE: Multiplanar, multiecho pulse sequences of the brain and surrounding structures were obtained without intravenous contrast.  COMPARISON:  None.  FINDINGS: Images are mildly to moderately degraded by motion. There are numerous punctate foci of restricted diffusion involving cortex and white matter of both cerebral hemispheres consistent with acute infarcts. Several slightly larger foci of acute infarction are present in the corpus callosum measuring up to approximately 1.3 cm in size. Small, acute infarcts are also present in the right greater than left basal ganglia, and there is a punctate acute infarct anteriorly in the right aspect of the medulla. There may be a small amount of hemorrhage associated with a few of these punctate infarcts. Old infarcts are present in the bilateral basal ganglia and bilateral cerebellum.  There is no mass, midline shift, or extra-axial fluid collection. There is mild cerebral and cerebellar atrophy. Orbits are unremarkable. Minimal  ethmoid air cell mucosal thickening is noted. Mastoid air cells are clear.  IMPRESSION: 1. Multiple small infarcts involving both cerebral hemispheres, corpus callosum, and medulla, consistent with embolic infarcts. These could be bland or septic emboli given history. 2. 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   Electronically Signed   By: Logan Bores   On: 09/03/2013 17:38   Dg Chest Port 1 View  09/05/2013   CLINICAL DATA:  Left-sided PICC placement.  EXAM: PORTABLE CHEST - 1 VIEW  COMPARISON:  Chest x-ray 08/31/2013.  FINDINGS: There is a left upper extremity PICC with tip terminating in the superior aspect of the right atrium (approximately 3 cm beyond the superior cavoatrial junction). No pneumothorax. No acute consolidative airspace disease. No pleural effusions. No evidence of pulmonary edema. Mild cardiomegaly. Upper mediastinal contours are within normal limits.  IMPRESSION: 1. Tip of PICC is in the right atrium  approximately 3 cm distal to the superior cavoatrial junction. 2. Status post extubation with significantly improved aeration. 3. Mild cardiomegaly.   Electronically Signed   By: Trudie Reed M.D.   On: 09/05/2013 14:21   Portable Chest Xray In Am  08/31/2013   CLINICAL DATA:  Respiratory failure.  EXAM: PORTABLE CHEST - 1 VIEW  COMPARISON:  08/30/2013.  FINDINGS: The endotracheal tube is 4 cm above the carina. The NG tube is coursing down the esophagus and into the stomach. The heart is enlarged but unchanged. Persistent mild perihilar edema and probable small effusions.  IMPRESSION: Stable support apparatus.  Persistent perihilar edema and areas of atelectasis.   Electronically Signed   By: Loralie Champagne M.D.   On: 08/31/2013 07:29   Dg Chest Port 1 View  08/30/2013   CLINICAL DATA:  Endotracheal tube placement.  EXAM: PORTABLE CHEST - 1 VIEW  COMPARISON:  08/30/2013 and 02/21/2013.  FINDINGS: 2214 hr. Endotracheal tube appears unchanged, within the mid trachea. There is  improved aeration of the lungs with residual patchy left greater than right basilar airspace opacities. There is no pleural effusion or pneumothorax. The heart size and mediastinal contours are stable.  IMPRESSION: Interval improved aeration of the lung bases. Endotracheal tube appears unchanged within the midtrachea.   Electronically Signed   By: Roxy Horseman M.D.   On: 08/30/2013 23:16   Portable Chest Xray  08/30/2013   CLINICAL DATA:  Endotracheal tube placement  EXAM: PORTABLE CHEST - 1 VIEW  COMPARISON:  Chest radiograph 02/21/2013  FINDINGS: Markedly low lung volumes limit evaluation. The endotracheal tube terminates within the distal trachea approximately 5 mm superior to the carina. Cardiomegaly. Heterogeneous lower lung opacities, left greater than right. No pleural effusion or pneumothorax.  IMPRESSION: Endotracheal tube terminates within the distal trachea. Recommend retraction and re-evaluation with repeat radiograph.  Scattered heterogeneous opacities favored to represent atelectasis in the setting of markedly low lung volumes.  Discussed with Nurse Clarene Critchley at 8:43 p.m. on 08/30/2013.   Electronically Signed   By: Annia Belt M.D.   On: 08/30/2013 20:44   Dg Abd Portable 1v  08/31/2013   CLINICAL DATA:  Enteric tube placement.  EXAM: PORTABLE ABDOMEN - 1 VIEW  COMPARISON:  Lumbar spine radiographs performed 09/26/2011  FINDINGS: The patient's enteric tube is seen ending at the body of the stomach, with the sideport at the fundus of the stomach.  The visualized bowel gas pattern is grossly unremarkable, with scattered air noted in the colon. No free intra-abdominal air is identified, though evaluation for free air is limited on a single supine view and due to motion artifact. No acute osseous abnormalities are seen.  IMPRESSION: Enteric tube is seen ending at the body of the stomach, with the sideport at the fundus of the stomach.   Electronically Signed   By: Roanna Raider M.D.   On:  08/31/2013 05:45    CBC  Recent Labs Lab 09/06/13 0930 09/07/13 0525 09/08/13 0600 09/09/13 0550  WBC 14.6* 13.2* 12.7* 12.4*  HGB 9.4* 8.6* 8.8* 8.9*  HCT 28.5* 26.8* 27.1* 27.7*  PLT 406* 394 427* 432*  MCV 81.7 82.2 82.1 82.7  MCH 26.9 26.4 26.7 26.6  MCHC 33.0 32.1 32.5 32.1  RDW 14.3 14.3 14.6 14.6    Chemistries   Recent Labs Lab 09/06/13 0930 09/07/13 0525 09/08/13 0600 09/09/13 0550  NA 134* 137 138 137  K 4.2 4.0 3.9 4.3  CL 100 105 104 107  CO2  $'22 21 20 20  'x$ GLUCOSE 235* 100* 114* 140*  BUN $Re'23 20 19 16  'PDl$ CREATININE 1.09 1.25* 1.27* 1.17*  CALCIUM 8.0* 7.8* 8.0* 8.0*   ------------------------------------------------------------------------------------------------------------------ estimated creatinine clearance is 90.9 ml/min (by C-G formula based on Cr of 1.17). ------------------------------------------------------------------------------------------------------------------ No results found for this basename: HGBA1C,  in the last 72 hours ------------------------------------------------------------------------------------------------------------------ No results found for this basename: CHOL, HDL, LDLCALC, TRIG, CHOLHDL, LDLDIRECT,  in the last 72 hours ------------------------------------------------------------------------------------------------------------------ No results found for this basename: TSH, T4TOTAL, FREET3, T3FREE, THYROIDAB,  in the last 72 hours ------------------------------------------------------------------------------------------------------------------ No results found for this basename: VITAMINB12, FOLATE, FERRITIN, TIBC, IRON, RETICCTPCT,  in the last 72 hours  Coagulation profile No results found for this basename: INR, PROTIME,  in the last 168 hours  No results found for this basename: DDIMER,  in the last 72 hours  Cardiac Enzymes No results found for this basename: CK, CKMB, TROPONINI, MYOGLOBIN,  in the last 168  hours ------------------------------------------------------------------------------------------------------------------ No components found with this basename: POCBNP,     Chaniyah Jahr D.O. on 09/12/2013 at 9:17 AM  Between 7am to 7pm - Pager - [phone]  After 7pm go to [email or web address] - password [email or web address]  And look for the night coverage person covering for me after hours  [facility] Office  [phone]

## 2013-09-13 DIAGNOSIS — L02429 Furuncle of limb, unspecified: Secondary | ICD-10-CM

## 2013-09-13 LAB — GLUCOSE, CAPILLARY
Glucose-Capillary: 70 mg/dL (ref 70–99)
Glucose-Capillary: 78 mg/dL (ref 70–99)

## 2013-09-13 MED ORDER — HYDROCODONE-ACETAMINOPHEN 5-325 MG PO TABS: 1.0000 | ORAL_TABLET | Freq: Four times a day (QID) | ORAL | Status: DC | PRN

## 2013-09-13 MED ORDER — HYDROCODONE-ACETAMINOPHEN 5-325 MG PO TABS
1.0000 | ORAL_TABLET | Freq: Four times a day (QID) | ORAL | Status: DC | PRN
Start: 2013-09-13 — End: 2013-09-13

## 2013-09-13 MED ORDER — LOPERAMIDE HCL 2 MG PO CAPS: 4.0000 mg | ORAL_CAPSULE | ORAL | Status: DC | PRN

## 2013-09-13 NOTE — Clinical Social Work Note (Signed)
CSW has confirmed bed availability at [facility]. CSW has faxed discharge summary to [place]. Discharge packet is complete and placed on pt's shadow chart. CSW and RNCM met with pt and pt's husband at bedside to confirm discharge disposition. Both pt and pt's husband expressed they were understanding and agreeable to discharge disposition. CSW updated RN regarding information above. RN stated she will be contacting [facility] ([facility]) once pt ready for discharge.  RN to please call report to [facility] of [place], [place]. - Pt will be admitted to Room 218-A in the B wing.  Lubertha Sayres, MSW, [facility] Licensed Clinical Social Worker [phone] and [phone]

## 2013-09-13 NOTE — Progress Notes (Signed)
Case discussed with PA Agree with her plan  Mary Sellaric M. Andrey CampanileWilson, MD, FACS General, Bariatric, & Minimally Invasive Surgery [facility], [place]

## 2013-09-13 NOTE — Progress Notes (Signed)
Had a conversation with patient and patient's sister and husband at the bedside.  [facility] in [facility] [place].  IV team has been paged to remove patient's central line.  Will continue to monitor.  Lance BoschAnna Henry Demeritt, RN

## 2013-09-13 NOTE — Discharge Summary (Addendum)
Physician Discharge Summary  [name] GMW:[id number] DOB: [birth year] DOA: 08/30/2013  PCP: Barbette Merino, MD  Admit date: 08/30/2013 Discharge date: 09/13/2013  Time spent: 45 minutes  Recommendations for Outpatient Follow-up:  The patient will be discharged to [facility] for rehabilitation. She will need to follow up with her primary care physician within one week of discharge. Patient also to followup with neurology within 2 months of discharge. Patient also may need to have a CBC within one week of discharge. Patient will need followup with Dr. Rosendo Gros, general surgeon within 2-3 weeks of discharge. Patient will continue to need wound care twice daily, with wet-to-dry dressing changes. Patient will be discharged with her flexiseal due to nature of wound.  Patient should continue her medications as prescribed. She should also continue physical as well as occupational rehabilitation as recommended by the nursing facility.  Discharge Diagnoses:  Multiple embolic infarcts Left thigh wound status post surgical drainage and debridement of abscess Sepsis secondary to left thigh abscess Acute encephalopathy secondary to sepsis versus CVA Acute respiratory failure secondary to sepsis with history of asthma Acute kidney injury Hyponatremia Leukocytosis Microcytic anemia Diabetes mellitus type 2, uncontrolled Hypertriglyceridemia Diarrhea  Discharge Condition: Stable  Diet recommendation: Carb modified/heart healthy  Filed Weights   08/30/13 2016 09/01/13 2134 09/02/13 0635  Weight: 135.5 kg (298 lb 11.6 oz) 144.8 kg (319 lb 3.6 oz) 144.8 kg (319 lb 3.6 oz)    History of present illness:  on 08/30/2013  38 yo female developed boil in left thigh one week ago. Presented with fever (Tm 102F), lethargy. Found to have Left leg abscess complicated by sepsis, hyperglycemia. Was taken to OR for I&D and remained on vent post op. She has hx of DM, HTN, asthma.   Hospital Course:    Multiple embolic infarcts  -Found on MRI of the brain  -CTA head and neck results are noted below, RICA with signs of chronic occlusion  -Echocardiogram is unremarkable, TEE showed no vegetations.  -Neurology was consulted and signed off.  -Recommend continuing aspirin and attending hypercoagulation workup.  -Cardiology was consulted for TEE, no signs of endocarditis  -PT and OT were consulted and recommended nursing facility and she will be discharged today.  Left thigh wound status post surgical drainage and debridement of abscess  -Blood cultures remain negative to date  -Urine culture negative  -Wound culture shows no growth, Gram stain shows abundant gram-negative rods and moderate gram-positive cocci in pairs  -Was placed vancomycin and Zosyn during hospital course -Will discharge patient on cipro and doxycycline for an additional week. -Patient will need to follow up with [facility] in 2-3 weeks -She will need wet to dry dressing changes BID or TID if needed -Continue pain control as needed  Sepsis secondary to left thigh abscess  -Resolved   Acute encephalopathy secondary to sepsis versus CVA  -Improving, patient appears stable.   Acute respiratory failure secondary to sepsis with history of asthma  -Resolved. Patient did require intubation during this hospitalization  -Continue Pulmicort, PRN albuterol,brovana   Acute kidney injury  -Likely secondary to sepsis and acute infarction  -Improved, will continue to monitor   Hyponatremia  -Resolved, patient was given IVF   Leukocytosis  -likely secondary to sepsis versus acute CVA  -Improving and trending downward  -Patient will need CBC in one week  Microcytic anemia  -Hemoglobin remains stable  Diabetes mellitus type 2  -Last HbA1c 15.4  -CBGs remain persistently high  -Continue insulin sliding scale, resistant along  with Lantus and CBG monitoring   High triglycerides  -Triglycerides 550  -Continue gemfibrozil    Diarrhea  -Patient had multiple episodes of diarrhea overnight and required flexiseal  -C. difficile PCR  negative -possibly secondary to patient's antibiotics  -Diarrhea appears to have slowed down  Morbid obesity -Patient will need to discuss with her primary care physician and diet and exercise plan once she has been cleared and her wound is healed.  Procedures: I&D of left thigh abscess   TEE No LAA thrombus No PFO or ASD Normal LV function No valvular disease or sign of endocarditis or abscess   2D Echocardiogram Normal LV function; severe LVH; mild LAE. No source of embolus  CTA Angiogram of the head and neck  09/05/2013  1. Occlusion of the intracranial right ICA. Multiple small collateral vessels in the region of the right ICA terminus is indicative of chronic occlusion. Right MCA is reconstituted new the MCA bifurcation, with slightly diminished distal flow compared to the left. Right ACA is mainly supplied via the anterior communicating artery. Right ICA is diffusely small in the neck, with moderate stenosis at its origin.  2. Moderate left carotid siphon atherosclerosis. Mild to moderate proximal right PCA stenosis. Mild to moderate attenuation of distal left PCA branch vessels.  3. 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  4. Dominant right vertebral artery without stenosis identified.  5. Small, acute bilateral infarcts as described on recent MRI.  Consultations: Neurology  Surgery  Discharge Exam: Filed Vitals:   09/13/13 0518  BP: 138/67  Pulse: 88  Temp: 97.9 F (36.6 C)  Resp: 18   Patient was seen and examined on day of discharge and found to be stable.  Exam  General: Well developed, well nourished, NAD, appears stated age  34: NCAT, mucous membranes moist.  Abdomen: obese, nondistended Extremities: Left thigh wound currently covered.  Neuro: AAOx3. No focal deficits. All 4  extremities.  Discharge Instructions      Discharge Instructions   Discharge instructions    Complete by:  As directed   The patient will be discharged to [facility] for rehabilitation. She will need to follow up with her primary care physician within one week of discharge. Patient also to followup with neurology within 2 months of discharge. Patient also may need to have a CBC within one week of discharge. Patient will need followup with Dr. Rosendo Gros, general surgeon within 2-3 weeks of discharge. Patient will continue to need wound care twice daily, with wet-to-dry dressing changes. Patient will be discharged with her flexiseal due to nature of wound.  Patient should continue her medications as prescribed. She should also continue physical as well as occupational rehabilitation as recommended by the nursing facility. Patient should follow a carb modified heart healthy diet.     Wound care    Complete by:  As directed   Remove packing and wash the wound with warm water and gentle soap.  Please continue with Wet to dry (saline soaked gauze with dry gauze on top) dressings and use mesh underwear to hold ABD pad over top as opposed to tape.  Dressing should be changed at least BID and more often as needed for saturation.  As able she is encouraged to get in the shower with the packing out to wash the wound with soap/water then replace the packing at least once a day.            Medication  List    STOP taking these medications       beclomethasone 80 MCG/ACT inhaler  Commonly known as:  QVAR     metFORMIN 500 MG tablet  Commonly known as:  GLUCOPHAGE      TAKE these medications       acetaminophen 325 MG tablet  Commonly known as:  TYLENOL  Take 2 tablets (650 mg total) by mouth every 6 (six) hours as needed for mild pain, fever or headache.     albuterol 108 (90 BASE) MCG/ACT inhaler  Commonly known as:  PROVENTIL HFA;VENTOLIN HFA  Inhale 2 puffs into the lungs every 4 (four)  hours as needed for wheezing or shortness of breath.     antiseptic oral rinse Liqd  15 mLs by Mouth Rinse route QID.     arformoterol 15 MCG/2ML Nebu  Commonly known as:  BROVANA  Take 2 mLs (15 mcg total) by nebulization every 12 (twelve) hours.     aspirin 325 MG EC tablet  Take 1 tablet (325 mg total) by mouth daily.     atorvastatin 20 MG tablet  Commonly known as:  LIPITOR  Take 1 tablet (20 mg total) by mouth daily at 6 PM.     budesonide 0.25 MG/2ML nebulizer solution  Commonly known as:  PULMICORT  Take 2 mLs (0.25 mg total) by nebulization every 12 (twelve) hours.     ciprofloxacin 500 MG tablet  Commonly known as:  CIPRO  Take 1 tablet (500 mg total) by mouth 2 (two) times daily.     doxycycline 50 MG capsule  Commonly known as:  VIBRAMYCIN  Take 1 capsule (50 mg total) by mouth 2 (two) times daily.     feeding supplement (PRO-STAT SUGAR FREE 64) Liqd  Take 30 mLs by mouth 2 (two) times daily with a meal.     gemfibrozil 600 MG tablet  Commonly known as:  LOPID  Take 1 tablet (600 mg total) by mouth 2 (two) times daily before a meal.     HYDROcodone-acetaminophen 5-325 MG per tablet  Commonly known as:  NORCO/VICODIN  Take 1 tablet by mouth every 6 (six) hours as needed for severe pain.     insulin aspart 100 UNIT/ML injection  Commonly known as:  novoLOG  - Inject 0-20 Units into the skin 3 (three) times daily with meals. CBG < 70: implement hypoglycemia protocol  - CBG 70 - 120: 0 units  - CBG 121 - 150: 3 units  - CBG 151 - 200: 4 units  - CBG 201 - 250: 7 units  - CBG 251 - 300: 11 units  - CBG 301 - 350: 15 units  - CBG 351 - 400: 20 units     insulin aspart 100 UNIT/ML injection  Commonly known as:  novoLOG  Inject 6 Units into the skin 3 (three) times daily with meals.     insulin glargine 100 UNIT/ML injection  Commonly known as:  LANTUS  Inject 0.22 mLs (22 Units total) into the skin daily.     insulin starter kit- syringes Misc  1  kit by Other route once.     loperamide 2 MG capsule  Commonly known as:  IMODIUM  Take 2 capsules (4 mg total) by mouth as needed for diarrhea or loose stools.     multivitamin with minerals Tabs tablet  Take 1 tablet by mouth daily.     saccharomyces boulardii 250 MG capsule  Commonly known as:  [employer]  Take 1 capsule (250 mg total) by mouth 2 (two) times daily.     triamterene-hydrochlorothiazide 75-50 MG per tablet  Commonly known as:  MAXZIDE  Take 2 tablets by mouth daily.       Allergies  Allergen Reactions  . Azithromycin Other (See Comments)    Nose bleeding event   Follow-up Information   Follow up with Xu,Jindong, MD. Schedule an appointment as soon as possible for a visit in 2 months. (Stroke Clinic)    Specialty:  Neurology   Contact information:   [address] [place] [address] [phone]       Follow up with Rosario Jacks., Anne Hahn, MD In 2 weeks. (For post-operation check.  Please call the office to verify your appointment time and date in 2-3 weeks.)    Specialty:  General Surgery   Contact information:   [address] [place] [address] [phone]       Follow up with Barbette Merino, MD. Schedule an appointment as soon as possible for a visit in 1 week. [facility] followup)    Specialty:  Internal Medicine   Contact information:   [address] [place] [address] [phone]        The results of significant diagnostics from this hospitalization (including imaging, microbiology, ancillary and laboratory) are listed below for reference.    Significant Diagnostic Studies: Ct Angio Head W/cm &/or Wo Cm  09/05/2013   CLINICAL DATA:  Diabetes, hypertension, and prior stroke with left lower extremity abscess and sepsis. Multiple, likely embolic infarcts on MRI. Stroke workup.  EXAM: CT ANGIOGRAPHY HEAD AND NECK  TECHNIQUE: Multidetector CT imaging of the head and neck was performed using the standard protocol  during bolus administration of intravenous contrast. Multiplanar CT image reconstructions and MIPs were obtained to evaluate the vascular anatomy. Carotid stenosis measurements (when applicable) are obtained utilizing NASCET criteria, using the distal internal carotid diameter as the denominator.  CONTRAST:  68m OMNIPAQUE IOHEXOL 350 MG/ML SOLN  COMPARISON:  Brain MRI 09/03/2013  FINDINGS: CTA HEAD FINDINGS  Small foci of hypoattenuation are seen in the anterior body and genu of the corpus callosum, right globus pallidus, and left posterior left temporal periventricular white matter corresponding to acute infarcts described on recent MRI additional, punctate acute infarcts on the MRI are not well demonstrated on the CT. Remote infarcts are noted in both cerebellar hemispheres and in the left basal ganglia/ corona radiata. There is no evidence of acute large territory cortical infarct, intracranial hemorrhage, midline shift, or extra-axial fluid collection. There is mild ex vacuo dilatation of the body of the left lateral ventricle. No abnormal enhancement is identified. Orbits are unremarkable. Mastoid air cells and paranasal sinuses are clear.  The intracranial right vertebral artery is patent, dominant, and supplies the basilar. The left intracranial vertebral artery is markedly small in caliber and only intermittently patent. PICA origins appear patent. AICA and SCA origins are patent. Basilar artery is mildly irregular but without stenosis. There is a focal, mild-to-moderate distal right P1 stenosis. There is irregular, mild to moderate attenuation of left P2 and P3 branches. Posterior communicating arteries are not clearly identified.  The proximal right intracranial ICA is diffusely small in caliber with a severe stenosis at the petrous-cavernous junction. The right ICA becomes occluded at the anterior cavernous segment. There is minimal to no reconstitution of the right intracranial ICA distal to this.  Multiple collateral vessels are seen in the expected region of the right ICA terminus. These collateral  vessels reconstitute the right MCA near the MCA bifurcation. Major right M2 branches appear patent although mildly smaller in caliber than those on the left. A small amount of contrast is present in the right A1 segment, with the A2 being supplied predominantly by the left ACA via the anterior communicating artery, which appears somewhat bulbous.  The left internal carotid artery is patent from skullbase to carotid terminus. There is mild calcification and moderate irregularity of the left carotid siphon. There is mild narrowing of the left ICA at the petrous-cavernous junction. There is diffuse irregularity with mild to moderate tandem stenoses involving the anterior genu of the left cavernous carotid as well as the supraclinoid left ICA. There is mild stenosis of the proximal left A1 segment. There is also mild proximal left M1 stenosis. There is mild, diffuse irregularity of left M2 and more distal branches.  Review of the MIP images confirms the above findings.  CTA NECK FINDINGS  Three vessel aortic arch. Subclavian arteries are grossly unremarkable. Evaluation of the proximal common carotid arteries is limited by image noise due to shoulder attenuation as well as mild motion artifact. No mid or distal common carotid artery stenosis is seen. There is moderate focal narrowing of the right internal carotid artery at its origin. The right ICA obtains a more normal caliber for approximately 1 cm beyond the stenosis before becoming narrowed again and demonstrating a diffusely narrowed but smooth luminal contour for the remainder of its course in the neck. The right ICA lumen near its origin at the site of proximal stenosis measures approximately 2.3 mm in diameter, with the diffusely narrowed mid to distal right cervical ICA measuring approximately 2.7 mm in diameter. The left cervical ICA is mildly tortuous but  patent and without stenosis.  The proximal right vertebral artery is grossly patent but suboptimally evaluated due to artifact. The V2 and V3 segments of the right vertebral artery are normal in appearance. The left vertebral artery appears occluded at its origin with reconstitution at the C4 level, with the lumen of the vertebral artery being markedly small in caliber distal to this.  A left subclavian approach central venous catheter is partially visualized. Jugulodigastric lymph nodes measure up to 10 mm in short axis.  Review of the MIP images confirms the above findings.  IMPRESSION: 1. Occlusion of the intracranial right ICA. Multiple small collateral vessels in the region of the right ICA terminus is indicative of chronic occlusion. Right MCA is reconstituted new the MCA bifurcation, with slightly diminished distal flow compared to the left. Right ACA is mainly supplied via the anterior communicating artery. Right ICA is diffusely small in the neck, with moderate stenosis at its origin. 2. Moderate left carotid siphon atherosclerosis. Mild to moderate proximal right PCA stenosis. Mild to moderate attenuation of distal left PCA branch vessels. 3. 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 4. Dominant right vertebral artery without stenosis identified. 5. Small, acute bilateral infarcts as described on recent MRI.   Electronically Signed   By: Logan Bores   On: 09/05/2013 20:48   Ct Angio Neck W/cm &/or Wo/cm  09/05/2013   CLINICAL DATA:  Diabetes, hypertension, and prior stroke with left lower extremity abscess and sepsis. Multiple, likely embolic infarcts on MRI. Stroke workup.  EXAM: CT ANGIOGRAPHY HEAD AND NECK  TECHNIQUE: Multidetector CT imaging of the head and neck was performed using the standard protocol during bolus administration of intravenous contrast. Multiplanar CT image  reconstructions and MIPs were obtained to evaluate the vascular  anatomy. Carotid stenosis measurements (when applicable) are obtained utilizing NASCET criteria, using the distal internal carotid diameter as the denominator.  CONTRAST:  39m OMNIPAQUE IOHEXOL 350 MG/ML SOLN  COMPARISON:  Brain MRI 09/03/2013  FINDINGS: CTA HEAD FINDINGS  Small foci of hypoattenuation are seen in the anterior body and genu of the corpus callosum, right globus pallidus, and left posterior left temporal periventricular white matter corresponding to acute infarcts described on recent MRI additional, punctate acute infarcts on the MRI are not well demonstrated on the CT. Remote infarcts are noted in both cerebellar hemispheres and in the left basal ganglia/ corona radiata. There is no evidence of acute large territory cortical infarct, intracranial hemorrhage, midline shift, or extra-axial fluid collection. There is mild ex vacuo dilatation of the body of the left lateral ventricle. No abnormal enhancement is identified. Orbits are unremarkable. Mastoid air cells and paranasal sinuses are clear.  The intracranial right vertebral artery is patent, dominant, and supplies the basilar. The left intracranial vertebral artery is markedly small in caliber and only intermittently patent. PICA origins appear patent. AICA and SCA origins are patent. Basilar artery is mildly irregular but without stenosis. There is a focal, mild-to-moderate distal right P1 stenosis. There is irregular, mild to moderate attenuation of left P2 and P3 branches. Posterior communicating arteries are not clearly identified.  The proximal right intracranial ICA is diffusely small in caliber with a severe stenosis at the petrous-cavernous junction. The right ICA becomes occluded at the anterior cavernous segment. There is minimal to no reconstitution of the right intracranial ICA distal to this. Multiple collateral vessels are seen in the expected region of the right ICA terminus. These collateral vessels reconstitute the right MCA near  the MCA bifurcation. Major right M2 branches appear patent although mildly smaller in caliber than those on the left. A small amount of contrast is present in the right A1 segment, with the A2 being supplied predominantly by the left ACA via the anterior communicating artery, which appears somewhat bulbous.  The left internal carotid artery is patent from skullbase to carotid terminus. There is mild calcification and moderate irregularity of the left carotid siphon. There is mild narrowing of the left ICA at the petrous-cavernous junction. There is diffuse irregularity with mild to moderate tandem stenoses involving the anterior genu of the left cavernous carotid as well as the supraclinoid left ICA. There is mild stenosis of the proximal left A1 segment. There is also mild proximal left M1 stenosis. There is mild, diffuse irregularity of left M2 and more distal branches.  Review of the MIP images confirms the above findings.  CTA NECK FINDINGS  Three vessel aortic arch. Subclavian arteries are grossly unremarkable. Evaluation of the proximal common carotid arteries is limited by image noise due to shoulder attenuation as well as mild motion artifact. No mid or distal common carotid artery stenosis is seen. There is moderate focal narrowing of the right internal carotid artery at its origin. The right ICA obtains a more normal caliber for approximately 1 cm beyond the stenosis before becoming narrowed again and demonstrating a diffusely narrowed but smooth luminal contour for the remainder of its course in the neck. The right ICA lumen near its origin at the site of proximal stenosis measures approximately 2.3 mm in diameter, with the diffusely narrowed mid to distal right cervical ICA measuring approximately 2.7 mm in diameter. The left cervical ICA is mildly tortuous but patent and without  stenosis.  The proximal right vertebral artery is grossly patent but suboptimally evaluated due to artifact. The V2 and V3  segments of the right vertebral artery are normal in appearance. The left vertebral artery appears occluded at its origin with reconstitution at the C4 level, with the lumen of the vertebral artery being markedly small in caliber distal to this.  A left subclavian approach central venous catheter is partially visualized. Jugulodigastric lymph nodes measure up to 10 mm in short axis.  Review of the MIP images confirms the above findings.  IMPRESSION: 1. Occlusion of the intracranial right ICA. Multiple small collateral vessels in the region of the right ICA terminus is indicative of chronic occlusion. Right MCA is reconstituted new the MCA bifurcation, with slightly diminished distal flow compared to the left. Right ACA is mainly supplied via the anterior communicating artery. Right ICA is diffusely small in the neck, with moderate stenosis at its origin. 2. Moderate left carotid siphon atherosclerosis. Mild to moderate proximal right PCA stenosis. Mild to moderate attenuation of distal left PCA branch vessels. 3. 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 4. Dominant right vertebral artery without stenosis identified. 5. Small, acute bilateral infarcts as described on recent MRI.   Electronically Signed   By: Logan Bores   On: 09/05/2013 20:48   Mr Brain Wo Contrast  09/03/2013   CLINICAL DATA:  Change in level of consciousness. Rule out stroke. Thigh abscess with sepsis.  EXAM: MRI HEAD WITHOUT CONTRAST  TECHNIQUE: Multiplanar, multiecho pulse sequences of the brain and surrounding structures were obtained without intravenous contrast.  COMPARISON:  None.  FINDINGS: Images are mildly to moderately degraded by motion. There are numerous punctate foci of restricted diffusion involving cortex and white matter of both cerebral hemispheres consistent with acute infarcts. Several slightly larger foci of acute infarction are present in the corpus callosum  measuring up to approximately 1.3 cm in size. Small, acute infarcts are also present in the right greater than left basal ganglia, and there is a punctate acute infarct anteriorly in the right aspect of the medulla. There may be a small amount of hemorrhage associated with a few of these punctate infarcts. Old infarcts are present in the bilateral basal ganglia and bilateral cerebellum.  There is no mass, midline shift, or extra-axial fluid collection. There is mild cerebral and cerebellar atrophy. Orbits are unremarkable. Minimal ethmoid air cell mucosal thickening is noted. Mastoid air cells are clear.  IMPRESSION: 1. Multiple small infarcts involving both cerebral hemispheres, corpus callosum, and medulla, consistent with embolic infarcts. These could be bland or septic emboli given history. 2. 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   Electronically Signed   By: Logan Bores   On: 09/03/2013 17:38   Dg Chest Port 1 View  09/05/2013   CLINICAL DATA:  Left-sided PICC placement.  EXAM: PORTABLE CHEST - 1 VIEW  COMPARISON:  Chest x-ray 08/31/2013.  FINDINGS: There is a left upper extremity PICC with tip terminating in the superior aspect of the right atrium (approximately 3 cm beyond the superior cavoatrial junction). No pneumothorax. No acute consolidative airspace disease. No pleural effusions. No evidence of pulmonary edema. Mild cardiomegaly. Upper mediastinal contours are within normal limits.  IMPRESSION: 1. Tip of PICC is in the right atrium approximately 3 cm distal to the superior cavoatrial junction. 2. Status post extubation with significantly improved aeration. 3. Mild cardiomegaly.   Electronically Signed   By: Mauri Brooklyn.D.  On: 09/05/2013 14:21   Portable Chest Xray In Am  08/31/2013   CLINICAL DATA:  Respiratory failure.  EXAM: PORTABLE CHEST - 1 VIEW  COMPARISON:  08/30/2013.  FINDINGS: The endotracheal tube is 4 cm above the carina. The NG tube is coursing down the esophagus and  into the stomach. The heart is enlarged but unchanged. Persistent mild perihilar edema and probable small effusions.  IMPRESSION: Stable support apparatus.  Persistent perihilar edema and areas of atelectasis.   Electronically Signed   By: Kalman Jewels M.D.   On: 08/31/2013 07:29   Dg Chest Port 1 View  08/30/2013   CLINICAL DATA:  Endotracheal tube placement.  EXAM: PORTABLE CHEST - 1 VIEW  COMPARISON:  08/30/2013 and 02/21/2013.  FINDINGS: 2214 hr. Endotracheal tube appears unchanged, within the mid trachea. There is improved aeration of the lungs with residual patchy left greater than right basilar airspace opacities. There is no pleural effusion or pneumothorax. The heart size and mediastinal contours are stable.  IMPRESSION: Interval improved aeration of the lung bases. Endotracheal tube appears unchanged within the midtrachea.   Electronically Signed   By: Camie Patience M.D.   On: 08/30/2013 23:16   Portable Chest Xray  08/30/2013   CLINICAL DATA:  Endotracheal tube placement  EXAM: PORTABLE CHEST - 1 VIEW  COMPARISON:  Chest radiograph 02/21/2013  FINDINGS: Markedly low lung volumes limit evaluation. The endotracheal tube terminates within the distal trachea approximately 5 mm superior to the carina. Cardiomegaly. Heterogeneous lower lung opacities, left greater than right. No pleural effusion or pneumothorax.  IMPRESSION: Endotracheal tube terminates within the distal trachea. Recommend retraction and re-evaluation with repeat radiograph.  Scattered heterogeneous opacities favored to represent atelectasis in the setting of markedly low lung volumes.  Discussed with Nurse Reece Leader at 8:43 p.m. on 08/30/2013.   Electronically Signed   By: Lovey Newcomer M.D.   On: 08/30/2013 20:44   Dg Abd Portable 1v  08/31/2013   CLINICAL DATA:  Enteric tube placement.  EXAM: PORTABLE ABDOMEN - 1 VIEW  COMPARISON:  Lumbar spine radiographs performed 09/26/2011  FINDINGS: The patient's enteric tube is seen  ending at the body of the stomach, with the sideport at the fundus of the stomach.  The visualized bowel gas pattern is grossly unremarkable, with scattered air noted in the colon. No free intra-abdominal air is identified, though evaluation for free air is limited on a single supine view and due to motion artifact. No acute osseous abnormalities are seen.  IMPRESSION: Enteric tube is seen ending at the body of the stomach, with the sideport at the fundus of the stomach.   Electronically Signed   By: Garald Balding M.D.   On: 08/31/2013 05:45    Microbiology: Recent Results (from the past 240 hour(s))  CULTURE, BLOOD (ROUTINE X 2)     Status: None   Collection Time    09/06/13  2:20 PM      Result Value Ref Range Status   Specimen Description BLOOD RIGHT THUMB   Final   Special Requests BOTTLES DRAWN AEROBIC AND ANAEROBIC 10CC   Final   Culture  Setup Time     Final   Value: 09/06/2013 19:16     Performed at [facility]   Culture     Final   Value: NO GROWTH 5 DAYS     Performed at [facility]   Report Status 09/12/2013 FINAL   Final  CULTURE, BLOOD (ROUTINE X 2)  Status: None   Collection Time    09/06/13  2:35 PM      Result Value Ref Range Status   Specimen Description BLOOD RIGHT HAND   Final   Special Requests BOTTLES DRAWN AEROBIC AND ANAEROBIC [facility]   Final   Culture  Setup Time     Final   Value: 09/06/2013 19:16     Performed at [facility]   Culture     Final   Value: NO GROWTH 5 DAYS     Performed at [facility]   Report Status 09/12/2013 FINAL   Final  CLOSTRIDIUM DIFFICILE BY PCR     Status: None   Collection Time    09/08/13 11:25 AM      Result Value Ref Range Status   C difficile by pcr NEGATIVE  NEGATIVE Final     Labs: Basic Metabolic Panel:  Recent Labs Lab 09/07/13 0525 09/08/13 0600 09/09/13 0550  NA 137 138 137  K 4.0 3.9 4.3  CL 105 104 107  CO2 _0 GLUCOSE 100* 114* 140*  BUN _1 CREATININE  1.25* 1.27* 1.17*  CALCIUM 7.8* 8.0* 8.0*   Liver Function Tests: No results found for this basename: AST, ALT, ALKPHOS, BILITOT, PROT, ALBUMIN,  in the last 168 hours No results found for this basename: LIPASE, AMYLASE,  in the last 168 hours No results found for this basename: AMMONIA,  in the last 168 hours CBC:  Recent Labs Lab 09/07/13 0525 09/08/13 0600 09/09/13 0550  WBC 13.2* 12.7* 12.4*  HGB 8.6* 8.8* 8.9*  HCT 26.8* 27.1* 27.7*  MCV 82.2 82.1 82.7  PLT 394 427* 432*   Cardiac Enzymes: No results found for this basename: CKTOTAL, CKMB, CKMBINDEX, TROPONINI,  in the last 168 hours BNP: BNP (last 3 results) No results found for this basename: PROBNP,  in the last 8760 hours CBG:  Recent Labs Lab 09/12/13 0632 09/12/13 1130 09/12/13 1626 09/12/13 2115 09/13/13 0650  GLUCAP 77 103* 130* 78 70       Signed:  Lavita Pontius  [facility] 09/13/2013, 10:19 AM

## 2013-09-13 NOTE — Progress Notes (Signed)
L PICC D/C per MD order/47cm intact/vaseline pressure dsg applied/pressure x265min/no bleeding/Seylah Wernert Poff, RN

## 2013-09-13 NOTE — Progress Notes (Signed)
Patient ID: Linda HarmsDavenia [name], female   DOB: 12/02/1975, 38 y.o.   MRN: [id number] 6 Days Post-Op  Subjective: Patient without complaint  Objective: Vital signs in last 24 hours: Temp:  [97.9 F (36.6 C)-99.8 F (37.7 C)] 97.9 F (36.6 C) (08/03 0518) Pulse Rate:  [88-104] 88 (08/03 0518) Resp:  [18-20] 18 (08/03 0518) BP: (138-182)/(67-90) 138/67 mmHg (08/03 0518) SpO2:  [98 %-100 %] 98 % (08/03 0518) Last BM Date: 09/13/13  Intake/Output from previous day: 08/02 0701 - 08/03 0700 In: 1400 [P.O.:600; I.V.:250; IV Piggyback:550] Out: -  Intake/Output this shift: Total I/O In: 240 [P.O.:240] Out: -   PE: Skin: Her wound is mostly clean. It is actually closing down in size. She has minimal fibrin tissue at the base of her wound. Otherwise has some good beefy healthy granulation tissue present.  Lab Results:  No results found for this basename: WBC, HGB, HCT, PLT,  in the last 72 hours BMET No results found for this basename: NA, K, CL, CO2, GLUCOSE, BUN, CREATININE, CALCIUM,  in the last 72 hours PT/INR No results found for this basename: LABPROT, INR,  in the last 72 hours CMP     Component Value Date/Time   NA 137 09/09/2013 0550   K 4.3 09/09/2013 0550   CL 107 09/09/2013 0550   CO2 20 09/09/2013 0550   GLUCOSE 140* 09/09/2013 0550   BUN 16 09/09/2013 0550   CREATININE 1.17* 09/09/2013 0550   CALCIUM 8.0* 09/09/2013 0550   PROT 6.9 08/31/2013 0220   ALBUMIN 1.5* 08/31/2013 0220   AST 27 08/31/2013 0220   ALT 19 08/31/2013 0220   ALKPHOS 154* 08/31/2013 0220   BILITOT 0.2* 08/31/2013 0220   GFRNONAA 59* 09/09/2013 0550   GFRAA 69* 09/09/2013 0550   Lipase  No results found for this basename: lipase       Studies/Results: No results found.  Anti-infectives: Anti-infectives   Start     Dose/Rate Route Frequency Ordered Stop   09/09/13 0000  ciprofloxacin (CIPRO) 500 MG tablet     500 mg Oral 2 times daily 09/09/13 1028     09/09/13 0000  doxycycline (VIBRAMYCIN) 50 MG  capsule     50 mg Oral 2 times daily 09/09/13 1028     09/07/13 0100  vancomycin (VANCOCIN) 1,500 mg in sodium chloride 0.9 % 500 mL IVPB     1,500 mg 250 mL/hr over 120 Minutes Intravenous Every 12 hours 09/06/13 1144     09/06/13 1145  vancomycin (VANCOCIN) 2,500 mg in sodium chloride 0.9 % 500 mL IVPB     2,500 mg 250 mL/hr over 120 Minutes Intravenous  Once 09/06/13 1144 09/06/13 1646   09/02/13 0800  vancomycin (VANCOCIN) 1,250 mg in sodium chloride 0.9 % 250 mL IVPB  Status:  Discontinued     1,250 mg 166.7 mL/hr over 90 Minutes Intravenous Every 12 hours 09/01/13 1252 09/03/13 1007   09/01/13 1100  vancomycin (VANCOCIN) 1,750 mg in sodium chloride 0.9 % 500 mL IVPB  Status:  Discontinued     1,750 mg 250 mL/hr over 120 Minutes Intravenous Every 24 hours 08/31/13 1418 09/01/13 1252   08/31/13 2000  piperacillin-tazobactam (ZOSYN) IVPB 3.375 g     3.375 g 12.5 mL/hr over 240 Minutes Intravenous Every 8 hours 08/31/13 1026     08/31/13 1200  vancomycin (VANCOCIN) 1,750 mg in sodium chloride 0.9 % 500 mL IVPB     1,750 mg 250 mL/hr over 120 Minutes Intravenous  Once 08/31/13  1036 08/31/13 1303   08/31/13 1100  piperacillin-tazobactam (ZOSYN) IVPB 3.375 g     3.375 g 12.5 mL/hr over 240 Minutes Intravenous  Once 08/31/13 1026 08/31/13 1504   08/30/13 1515  [MAR Hold]  vancomycin (VANCOCIN) IVPB 1000 mg/200 mL premix     (On MAR Hold since 08/30/13 1616)   1,000 mg 200 mL/hr over 60 Minutes Intravenous  Once 08/30/13 1510 08/30/13 1647   08/30/13 1515  piperacillin-tazobactam (ZOSYN) IVPB 3.375 g     3.375 g 100 mL/hr over 30 Minutes Intravenous  Once 08/30/13 1510 08/30/13 1601       Assessment/Plan  POD #13 sy/o I&D left thigh abscess/necrotic wound   Plan: 1. Patient is stable for discharge to skilled nursing facility today. She should continue normal saline wet to dry dressing changes twice a day. 2. Follow up with Dr. Derrell Lolling in 2-3 weeks.  LOS: 14 days     [name] E 09/13/2013, 10:57 AM Pager: [phone]

## 2013-09-13 NOTE — Progress Notes (Signed)
[facility] arrived to transport patient to facility.  Spoke with facility already to give report.  Flexi seal bags put into [facility] packet and sent with patient.  Prescription for hydrocodone also in packet.  Lance BoschAnna Eugenia Eldredge, RN

## 2013-09-13 NOTE — Progress Notes (Signed)
Called report [facility] Att[place] [place].  Awaiting [facility].  Lance BoschAnna Nai Dasch, RN

## 2013-09-16 ENCOUNTER — Inpatient Hospital Stay (HOSPITAL_COMMUNITY)
Admission: EM | Admit: 2013-09-16 | Discharge: 2013-09-20 | DRG: 919 | Disposition: A | Discharge status: Home / Self Care | Payer: Medicaid Other | Attending: Internal Medicine | Admitting: Internal Medicine

## 2013-09-16 ENCOUNTER — Emergency Department: Payer: Self-pay | Admitting: Student

## 2013-09-16 ENCOUNTER — Encounter (HOSPITAL_COMMUNITY): Payer: Self-pay | Admitting: Emergency Medicine

## 2013-09-16 DIAGNOSIS — R6520 Severe sepsis without septic shock: Secondary | ICD-10-CM

## 2013-09-16 DIAGNOSIS — D638 Anemia in other chronic diseases classified elsewhere: Secondary | ICD-10-CM | POA: Diagnosis present

## 2013-09-16 DIAGNOSIS — I6340 Cerebral infarction due to embolism of unspecified cerebral artery: Secondary | ICD-10-CM

## 2013-09-16 DIAGNOSIS — Z794 Long term (current) use of insulin: Secondary | ICD-10-CM | POA: Diagnosis not present

## 2013-09-16 DIAGNOSIS — Z711 Person with feared health complaint in whom no diagnosis is made: Secondary | ICD-10-CM

## 2013-09-16 DIAGNOSIS — Y838 Other surgical procedures as the cause of abnormal reaction of the patient, or of later complication, without mention of misadventure at the time of the procedure: Secondary | ICD-10-CM | POA: Diagnosis not present

## 2013-09-16 DIAGNOSIS — S79929A Unspecified injury of unspecified thigh, initial encounter: Secondary | ICD-10-CM | POA: Diagnosis present

## 2013-09-16 DIAGNOSIS — D509 Iron deficiency anemia, unspecified: Secondary | ICD-10-CM | POA: Diagnosis not present

## 2013-09-16 DIAGNOSIS — F329 Major depressive disorder, single episode, unspecified: Secondary | ICD-10-CM | POA: Diagnosis not present

## 2013-09-16 DIAGNOSIS — Z7982 Long term (current) use of aspirin: Secondary | ICD-10-CM | POA: Diagnosis not present

## 2013-09-16 DIAGNOSIS — Z8673 Personal history of transient ischemic attack (TIA), and cerebral infarction without residual deficits: Secondary | ICD-10-CM

## 2013-09-16 DIAGNOSIS — K219 Gastro-esophageal reflux disease without esophagitis: Secondary | ICD-10-CM | POA: Diagnosis not present

## 2013-09-16 DIAGNOSIS — T792XXA Traumatic secondary and recurrent hemorrhage and seroma, initial encounter: Secondary | ICD-10-CM

## 2013-09-16 DIAGNOSIS — IMO0001 Reserved for inherently not codable concepts without codable children: Secondary | ICD-10-CM | POA: Diagnosis not present

## 2013-09-16 DIAGNOSIS — L02419 Cutaneous abscess of limb, unspecified: Secondary | ICD-10-CM

## 2013-09-16 DIAGNOSIS — S79919A Unspecified injury of unspecified hip, initial encounter: Secondary | ICD-10-CM | POA: Diagnosis present

## 2013-09-16 DIAGNOSIS — IMO0002 Reserved for concepts with insufficient information to code with codable children: Secondary | ICD-10-CM | POA: Diagnosis not present

## 2013-09-16 DIAGNOSIS — Z833 Family history of diabetes mellitus: Secondary | ICD-10-CM | POA: Diagnosis not present

## 2013-09-16 DIAGNOSIS — E781 Pure hyperglyceridemia: Secondary | ICD-10-CM

## 2013-09-16 DIAGNOSIS — J45909 Unspecified asthma, uncomplicated: Secondary | ICD-10-CM | POA: Diagnosis not present

## 2013-09-16 DIAGNOSIS — L03119 Cellulitis of unspecified part of limb: Secondary | ICD-10-CM

## 2013-09-16 DIAGNOSIS — N390 Urinary tract infection, site not specified: Secondary | ICD-10-CM | POA: Diagnosis not present

## 2013-09-16 DIAGNOSIS — S71102S Unspecified open wound, left thigh, sequela: Secondary | ICD-10-CM

## 2013-09-16 DIAGNOSIS — Z6841 Body Mass Index (BMI) 40.0 and over, adult: Secondary | ICD-10-CM

## 2013-09-16 DIAGNOSIS — I10 Essential (primary) hypertension: Secondary | ICD-10-CM | POA: Diagnosis not present

## 2013-09-16 DIAGNOSIS — N179 Acute kidney failure, unspecified: Secondary | ICD-10-CM

## 2013-09-16 DIAGNOSIS — A419 Sepsis, unspecified organism: Secondary | ICD-10-CM | POA: Diagnosis not present

## 2013-09-16 DIAGNOSIS — T148XXA Other injury of unspecified body region, initial encounter: Secondary | ICD-10-CM

## 2013-09-16 DIAGNOSIS — F3289 Other specified depressive episodes: Secondary | ICD-10-CM | POA: Diagnosis present

## 2013-09-16 DIAGNOSIS — J9601 Acute respiratory failure with hypoxia: Secondary | ICD-10-CM

## 2013-09-16 DIAGNOSIS — E43 Unspecified severe protein-calorie malnutrition: Secondary | ICD-10-CM | POA: Diagnosis present

## 2013-09-16 DIAGNOSIS — R197 Diarrhea, unspecified: Secondary | ICD-10-CM | POA: Diagnosis not present

## 2013-09-16 DIAGNOSIS — E119 Type 2 diabetes mellitus without complications: Secondary | ICD-10-CM

## 2013-09-16 DIAGNOSIS — E1165 Type 2 diabetes mellitus with hyperglycemia: Secondary | ICD-10-CM

## 2013-09-16 LAB — CBC
HCT: 27.2 % — ABNORMAL LOW (ref 35.0–47.0)
HGB: 8.6 g/dL — AB (ref 12.0–16.0)
MCH: 26.8 pg (ref 26.0–34.0)
MCHC: 31.6 g/dL — ABNORMAL LOW (ref 32.0–36.0)
MCV: 85 fL (ref 80–100)
PLATELETS: 433 10*3/uL (ref 150–440)
RBC: 3.21 10*6/uL — AB (ref 3.80–5.20)
RDW: 14.7 % — ABNORMAL HIGH (ref 11.5–14.5)
WBC: 6.5 10*3/uL (ref 3.6–11.0)

## 2013-09-16 LAB — CBC WITH DIFFERENTIAL/PLATELET
Basophils Absolute: 0 10*3/uL (ref 0.0–0.1)
Basophils Relative: 0 % (ref 0–1)
EOS PCT: 3 % (ref 0–5)
Eosinophils Absolute: 0.2 10*3/uL (ref 0.0–0.7)
HCT: 25.8 % — ABNORMAL LOW (ref 36.0–46.0)
Hemoglobin: 8.1 g/dL — ABNORMAL LOW (ref 12.0–15.0)
LYMPHS ABS: 2 10*3/uL (ref 0.7–4.0)
Lymphocytes Relative: 30 % (ref 12–46)
MCH: 26.5 pg (ref 26.0–34.0)
MCHC: 31.4 g/dL (ref 30.0–36.0)
MCV: 84.3 fL (ref 78.0–100.0)
Monocytes Absolute: 0.8 10*3/uL (ref 0.1–1.0)
Monocytes Relative: 12 % (ref 3–12)
NEUTROS PCT: 55 % (ref 43–77)
Neutro Abs: 3.5 10*3/uL (ref 1.7–7.7)
Platelets: 457 10*3/uL — ABNORMAL HIGH (ref 150–400)
RBC: 3.06 MIL/uL — ABNORMAL LOW (ref 3.87–5.11)
RDW: 15.3 % (ref 11.5–15.5)
WBC: 6.5 10*3/uL (ref 4.0–10.5)

## 2013-09-16 LAB — COMPREHENSIVE METABOLIC PANEL
ALBUMIN: 1.3 g/dL — AB (ref 3.4–5.0)
ALK PHOS: 59 U/L
AST: 10 U/L — AB (ref 15–37)
Anion Gap: 5 — ABNORMAL LOW (ref 7–16)
BUN: 15 mg/dL (ref 7–18)
Bilirubin,Total: 0.2 mg/dL (ref 0.2–1.0)
CALCIUM: 8.3 mg/dL — AB (ref 8.5–10.1)
Chloride: 108 mmol/L — ABNORMAL HIGH (ref 98–107)
Co2: 24 mmol/L (ref 21–32)
Creatinine: 1.79 mg/dL — ABNORMAL HIGH (ref 0.60–1.30)
EGFR (African American): 42 — ABNORMAL LOW
EGFR (Non-African Amer.): 36 — ABNORMAL LOW
GLUCOSE: 125 mg/dL — AB (ref 65–99)
Osmolality: 276 (ref 275–301)
POTASSIUM: 4.7 mmol/L (ref 3.5–5.1)
SGPT (ALT): 11 U/L — ABNORMAL LOW
SODIUM: 137 mmol/L (ref 136–145)
Total Protein: 7 g/dL (ref 6.4–8.2)

## 2013-09-16 LAB — BASIC METABOLIC PANEL
Anion gap: 12 (ref 5–15)
BUN: 17 mg/dL (ref 6–23)
CO2: 20 mEq/L (ref 19–32)
Calcium: 8.3 mg/dL — ABNORMAL LOW (ref 8.4–10.5)
Chloride: 105 mEq/L (ref 96–112)
Creatinine, Ser: 1.59 mg/dL — ABNORMAL HIGH (ref 0.50–1.10)
GFR calc Af Amer: 47 mL/min — ABNORMAL LOW (ref >90)
GFR calc non Af Amer: 41 mL/min — ABNORMAL LOW (ref >90)
GLUCOSE: 127 mg/dL — AB (ref 70–99)
Potassium: 5.4 mEq/L — ABNORMAL HIGH (ref 3.7–5.3)
Sodium: 137 mEq/L (ref 137–147)

## 2013-09-16 LAB — LACTIC ACID, PLASMA: LACTIC ACID, VENOUS: 1.4 mmol/L (ref 0.5–2.2)

## 2013-09-16 LAB — SAMPLE TO BLOOD BANK

## 2013-09-16 LAB — GLUCOSE, CAPILLARY: GLUCOSE-CAPILLARY: 153 mg/dL — AB (ref 70–99)

## 2013-09-16 MED ORDER — ARFORMOTEROL TARTRATE 15 MCG/2ML IN NEBU
15.0000 ug | INHALATION_SOLUTION | Freq: Two times a day (BID) | RESPIRATORY_TRACT | Status: DC
  Administered 2013-09-17 – 2013-09-20 (×6): 15 ug via RESPIRATORY_TRACT
  Filled 2013-09-16 (×9): qty 2

## 2013-09-16 MED ORDER — AMPICILLIN-SULBACTAM SODIUM 3 (2-1) G IJ SOLR
3.0000 g | Freq: Four times a day (QID) | INTRAMUSCULAR | Status: DC
  Administered 2013-09-16 – 2013-09-20 (×15): 3 g via INTRAVENOUS
  Filled 2013-09-16 (×19): qty 3

## 2013-09-16 MED ORDER — ATORVASTATIN CALCIUM 20 MG PO TABS
20.0000 mg | ORAL_TABLET | Freq: Every day | ORAL | Status: DC
  Administered 2013-09-18 – 2013-09-19 (×2): 20 mg via ORAL
  Filled 2013-09-16 (×4): qty 1

## 2013-09-16 MED ORDER — INSULIN ASPART 100 UNIT/ML ~~LOC~~ SOLN
0.0000 [IU] | Freq: Three times a day (TID) | SUBCUTANEOUS | Status: DC
  Administered 2013-09-17: 3 [IU] via SUBCUTANEOUS
  Administered 2013-09-17 (×2): 2 [IU] via SUBCUTANEOUS
  Administered 2013-09-19: 3 [IU] via SUBCUTANEOUS
  Administered 2013-09-19 – 2013-09-20 (×2): 2 [IU] via SUBCUTANEOUS

## 2013-09-16 MED ORDER — BUDESONIDE 0.25 MG/2ML IN SUSP
0.2500 mg | Freq: Two times a day (BID) | RESPIRATORY_TRACT | Status: DC
  Administered 2013-09-17 – 2013-09-20 (×6): 0.25 mg via RESPIRATORY_TRACT
  Filled 2013-09-16 (×9): qty 2

## 2013-09-16 MED ORDER — SACCHAROMYCES BOULARDII 250 MG PO CAPS
250.0000 mg | ORAL_CAPSULE | Freq: Two times a day (BID) | ORAL | Status: DC
  Administered 2013-09-16 – 2013-09-20 (×7): 250 mg via ORAL
  Filled 2013-09-16 (×9): qty 1

## 2013-09-16 MED ORDER — ONDANSETRON HCL 4 MG PO TABS: 4.0000 mg | ORAL_TABLET | Freq: Four times a day (QID) | ORAL | Status: DC | PRN

## 2013-09-16 MED ORDER — AMLODIPINE BESYLATE 10 MG PO TABS
10.0000 mg | ORAL_TABLET | Freq: Every day | ORAL | Status: DC
  Administered 2013-09-16 – 2013-09-20 (×5): 10 mg via ORAL
  Filled 2013-09-16 (×5): qty 1

## 2013-09-16 MED ORDER — ACETAMINOPHEN 650 MG RE SUPP: 650.0000 mg | Freq: Four times a day (QID) | RECTAL | Status: DC | PRN

## 2013-09-16 MED ORDER — ALBUTEROL SULFATE (2.5 MG/3ML) 0.083% IN NEBU: 2.5000 mg | INHALATION_SOLUTION | RESPIRATORY_TRACT | Status: DC | PRN

## 2013-09-16 MED ORDER — ACETAMINOPHEN 325 MG PO TABS: 650.0000 mg | ORAL_TABLET | Freq: Four times a day (QID) | ORAL | Status: DC | PRN

## 2013-09-16 MED ORDER — OXYCODONE HCL 5 MG PO TABS
5.0000 mg | ORAL_TABLET | ORAL | Status: DC | PRN
Start: 2013-09-16 — End: 2013-09-20
  Administered 2013-09-18 – 2013-09-19 (×2): 5 mg via ORAL
  Filled 2013-09-16 (×2): qty 1

## 2013-09-16 MED ORDER — PRO-STAT SUGAR FREE PO LIQD
30.0000 mL | Freq: Two times a day (BID) | ORAL | Status: DC
  Administered 2013-09-17: 30 mL via ORAL
  Filled 2013-09-16 (×9): qty 30

## 2013-09-16 MED ORDER — GUAIFENESIN-DM 100-10 MG/5ML PO SYRP
5.0000 mL | ORAL_SOLUTION | ORAL | Status: DC | PRN
  Filled 2013-09-16: qty 5

## 2013-09-16 MED ORDER — ALUM & MAG HYDROXIDE-SIMETH 200-200-20 MG/5ML PO SUSP
30.0000 mL | Freq: Four times a day (QID) | ORAL | Status: DC | PRN
  Administered 2013-09-16: 30 mL via ORAL
  Filled 2013-09-16: qty 30

## 2013-09-16 MED ORDER — GEMFIBROZIL 600 MG PO TABS
600.0000 mg | ORAL_TABLET | Freq: Two times a day (BID) | ORAL | Status: DC
  Administered 2013-09-17 – 2013-09-20 (×7): 600 mg via ORAL
  Filled 2013-09-16 (×12): qty 1

## 2013-09-16 MED ORDER — ADULT MULTIVITAMIN W/MINERALS CH
1.0000 | ORAL_TABLET | Freq: Every day | ORAL | Status: DC
  Administered 2013-09-16 – 2013-09-20 (×5): 1 via ORAL
  Filled 2013-09-16 (×5): qty 1

## 2013-09-16 MED ORDER — SODIUM CHLORIDE 0.9 % IV SOLN
INTRAVENOUS | Status: DC
  Administered 2013-09-17 – 2013-09-19 (×2): via INTRAVENOUS

## 2013-09-16 MED ORDER — MORPHINE SULFATE 2 MG/ML IJ SOLN: 1.0000 mg | INTRAMUSCULAR | Status: DC | PRN

## 2013-09-16 MED ORDER — ONDANSETRON HCL 4 MG/2ML IJ SOLN
4.0000 mg | Freq: Four times a day (QID) | INTRAMUSCULAR | Status: DC | PRN
  Administered 2013-09-16: 4 mg via INTRAVENOUS
  Filled 2013-09-16: qty 2

## 2013-09-16 MED ORDER — ASPIRIN EC 325 MG PO TBEC
325.0000 mg | DELAYED_RELEASE_TABLET | Freq: Every day | ORAL | Status: DC
  Administered 2013-09-17 – 2013-09-20 (×4): 325 mg via ORAL
  Filled 2013-09-16 (×4): qty 1

## 2013-09-16 MED ORDER — INSULIN GLARGINE 100 UNIT/ML ~~LOC~~ SOLN
22.0000 [IU] | Freq: Every day | SUBCUTANEOUS | Status: DC
Start: 2013-09-16 — End: 2013-09-18
  Administered 2013-09-16 – 2013-09-17 (×2): 22 [IU] via SUBCUTANEOUS
  Filled 2013-09-16 (×3): qty 0.22

## 2013-09-16 NOTE — ED Notes (Signed)
MD at bedside. Trauma Surgery consult. Dr. Janee Mornhompson.

## 2013-09-16 NOTE — H&P (Signed)
PATIENT DETAILS Name: [name] Age: 38 y.o. Sex: female Date of Birth: 1975/05/14 Admit Date: 09/16/2013 CHY:IFOYD,XAJOI, MD   CHIEF COMPLAINT:  Bleeding from left thigh wound for the past 2 days  HPI: [name] is a 38 y.o. female with a Past Medical History of recent left eye abscess status post incision and drainage, recent sepsis secondary to left thigh abscess, embolic CVA, diabetes, hypertriglyceridemia  who presents today with the above noted complaint. Patient was just discharged from this facility a few days back and transferred to a skilled nursing facility for further care. For the past 2 days, patient has noted bleeding from her left thigh wound. She was taken to [facility], and transferred to [facility] for further evaluation and treatment. In the emergency room, she was evaluated by Dr. Grandville Silos from [facility] [place] [facility] this meeting with a hand-held cautery and silver nitrate. Since the patient was just discharged from the hospitalist service, I was asked to admit this patient for further observation. Please note, patient has a Foley catheter and a flexiseal in place, per her these have been in place since her last admission. Patient denies any fever, chest pain, shortness of breath, nausea, vomiting. Review of last discharge summary, indicates diarrhea as an issue for which she has a flexiseal, however C Diff PCR was negative. Please note, no labs hematocrit noted for today, patient had labs at [facility] which showed a creatinine of 1.79, WBC of 6.5, hemoglobin of 8.6 and a platelet count of 433,000. Rest of her labs were generally within normal limits.  ALLERGIES:   Allergies  Allergen Reactions  . Azithromycin Other (See Comments)    Nose bleeding event    PAST MEDICAL HISTORY: Past Medical History  Diagnosis Date  . Morbid obesity   . Hypertension   . CVA (cerebral infarction)  1990's    "writing is not the same since"  . Asthma   . Type II diabetes mellitus   . GERD (gastroesophageal reflux disease)     PAST SURGICAL HISTORY: Past Surgical History  Procedure Laterality Date  . Incision and drainage of wound Left 08/30/2013    thigh necrotic wound/notes 08/30/2013  . Irrigation and debridement abscess Left 08/30/2013    Procedure: IRRIGATION AND DEBRIDEMENT ABSCESS Left Thigh Necrotic Wound;  Surgeon: Ralene Ok, MD;  Location: [facility];  Service: General;  Laterality: Left;  . Tee without cardioversion N/A 09/07/2013    Procedure: TRANSESOPHAGEAL ECHOCARDIOGRAM (TEE);  Surgeon: Pixie Casino, MD;  Location: [facility] ENDOSCOPY;  Service: Cardiovascular;  Laterality: N/A;    MEDICATIONS AT HOME: Prior to Admission medications   Medication Sig Start Date End Date Taking? Authorizing Provider  acetaminophen (TYLENOL) 325 MG tablet Take 650 mg by mouth every 6 (six) hours as needed for mild pain, fever or headache. 09/09/13  Yes Maryann Mikhail, DO  albuterol (PROVENTIL HFA;VENTOLIN HFA) 108 (90 BASE) MCG/ACT inhaler Inhale 2 puffs into the lungs every 4 (four) hours as needed for wheezing or shortness of breath. 02/21/13  Yes Margarita Mail, PA-C  Amino Acids-Protein Hydrolys (FEEDING SUPPLEMENT, PRO-STAT SUGAR FREE 64,) LIQD Take 30 mLs by mouth 2 (two) times daily with a meal. 09/09/13  Yes Maryann Mikhail, DO  antiseptic oral rinse (BIOTENE) LIQD 15 mLs by Mouth Rinse route daily as needed for dry mouth. 09/09/13  Yes Maryann Mikhail, DO  arformoterol (BROVANA) 15 MCG/2ML NEBU Take 15 mcg by nebulization every 12 (  twelve) hours. 09/09/13  Yes Maryann Mikhail, DO  aspirin EC 325 MG EC tablet Take 1 tablet (325 mg total) by mouth daily. 09/09/13  Yes Maryann Mikhail, DO  atorvastatin (LIPITOR) 20 MG tablet Take 1 tablet (20 mg total) by mouth daily at 6 PM. 09/09/13  Yes Maryann Mikhail, DO  budesonide (PULMICORT) 0.25 MG/2ML nebulizer solution Take 0.25 mg by nebulization  every 12 (twelve) hours. 09/09/13  Yes Maryann Mikhail, DO  doxycycline (VIBRAMYCIN) 50 MG capsule Take 1 capsule (50 mg total) by mouth 2 (two) times daily. 09/09/13  Yes Maryann Mikhail, DO  gemfibrozil (LOPID) 600 MG tablet Take 1 tablet (600 mg total) by mouth 2 (two) times daily before a meal. 09/09/13  Yes Maryann Mikhail, DO  HYDROcodone-acetaminophen (NORCO/VICODIN) 5-325 MG per tablet Take 1 tablet by mouth every 6 (six) hours as needed for severe pain. 09/13/13  Yes Maryann Mikhail, DO  insulin aspart (NOVOLOG) 100 UNIT/ML injection Inject 0-20 Units into the skin 3 (three) times daily with meals. CBG < 70: implement hypoglycemia protocol CBG 70 - 120: 0 units CBG 121 - 150: 3 units CBG 151 - 200: 4 units CBG 201 - 250: 7 units CBG 251 - 300: 11 units CBG 301 - 350: 15 units CBG 351 - 400: 20 units 09/09/13  Yes Maryann Mikhail, DO  insulin glargine (LANTUS) 100 UNIT/ML injection Inject 0.22 mLs (22 Units total) into the skin daily. 09/09/13  Yes Maryann Mikhail, DO  loperamide (IMODIUM) 2 MG capsule Take 2 capsules (4 mg total) by mouth as needed for diarrhea or loose stools. 09/13/13  Yes Maryann Mikhail, DO  Multiple Vitamin (MULTIVITAMIN WITH MINERALS) TABS tablet Take 1 tablet by mouth daily. 09/09/13  Yes Maryann Mikhail, DO  saccharomyces boulardii (FLORASTOR) 250 MG capsule Take 250 mg by mouth 2 (two) times daily. 09/09/13  Yes Maryann Mikhail, DO  triamterene-hydrochlorothiazide (MAXZIDE) 75-50 MG per tablet Take 2 tablets by mouth daily.    Yes Historical Provider, MD  insulin starter kit- syringes MISC 1 kit by Other route once. 09/09/13   Maryann Mikhail, DO    FAMILY HISTORY: Family History  Problem Relation Age of Onset  . Diabetes Mother   . Kidney disease Mother     SOCIAL HISTORY:  reports that she has never smoked. She has never used smokeless tobacco. She reports that she does not drink alcohol or use illicit drugs.  REVIEW OF SYSTEMS:  Constitutional:   No  weight  loss, night sweats,  Fevers, chills, fatigue.  HEENT:    No headaches, Difficulty swallowing,Tooth/dental problems,Sore throat,   Cardio-vascular: No chest pain,  Orthopnea, PND, swelling in lower extremities, anasarca,   dizziness, palpitations  GI:  No heartburn, indigestion, abdominal pain, nausea, vomiting, diarrhea, change in bowel habits, loss of appetite  Resp: No shortness of breath with exertion or at rest.  No excess mucus, no productive cough, No non-productive cough,  No coughing up of blood.No change in color of mucus.No wheezing.No chest wall deformity  Skin:  no rash or lesions.  GU:  no dysuria, change in color of urine, no urgency or frequency.  No flank pain.  Musculoskeletal: No joint pain or swelling.  No decreased range of motion.  No back pain.  Psych: No change in mood or affect. No depression or anxiety.  No memory loss.   PHYSICAL EXAM: Blood pressure 155/92, pulse 115, temperature 98 F (36.7 C), temperature source Oral, resp. rate 18, height _0  (1.6 m), weight 136.079 kg (  300 lb), last menstrual period 08/11/2013, SpO2 100.00%, not currently breastfeeding.  General appearance :Awake, alert, not in any distress. Speech Clear. Not toxic Looking HEENT: Atraumatic and Normocephalic, pupils equally reactive to light and accomodation Neck: supple, no JVD. No cervical lymphadenopathy.  Chest:Good air entry bilaterally, no added sounds  CVS: S1 S2 regular, no murmurs.  Abdomen: Bowel sounds present, Non tender and not distended with no gaurding, rigidity or rebound. Left groin wound-with a dry dressing in place-no soakage. Extremities: B/L Lower Ext shows no edema, both legs are warm to touch Neurology: Awake alert, and oriented X 3, Non focal Skin:No Rash Wounds:N/A  LABS ON ADMISSION:  No results found for this basename: NA, K, CL, CO2, GLUCOSE, BUN, CREATININE, CALCIUM, MG, PHOS,  in the last 72 hours No results found for this basename: AST, ALT,  ALKPHOS, BILITOT, PROT, ALBUMIN,  in the last 72 hours No results found for this basename: LIPASE, AMYLASE,  in the last 72 hours  Recent Labs  09/16/13 1849  WBC 6.5  NEUTROABS 3.5  HGB 8.1*  HCT 25.8*  MCV 84.3  PLT 457*   No results found for this basename: CKTOTAL, CKMB, CKMBINDEX, TROPONINI,  in the last 72 hours No results found for this basename: DDIMER,  in the last 72 hours No components found with this basename: POCBNP,    RADIOLOGIC STUDIES ON ADMISSION: No results found.   ASSESSMENT AND PLAN: Present on Admission:  . Bleeding from wound - Patient will be admitted overnight for further observation, Gen. surgery has already evaluated the patient and a bedside hand-held cautery and silver nitrate was used to control bleeding. Dressing is in place, it is currently dry and no soakage is seen. Continues to be stable overnight, suspect can be discharged back to the facility. I have reviewed her prior admission cultures, anaerobic culture was positive for bacteroids- therefore will place her on Unasyn. Please note she was discharged on ciprofloxacin and doxycycline, suspect we can discharge her on Augmentin instead.   . Diabetes mellitus with hyperglycemia - Continue Lantus, place on SSI. Follow CBGs.   . Cerebral embolism with cerebral infarction - Hold aspirin for today, if no further bleeding, resume tomorrow. Currently without any focal deficits.   Marland Kitchen AKI (acute kidney injury) - Labs from [facility] indicate acute elevation of creatinine, she was on diuretic therapy as an outpatient. We'll hold all diuretics, place on amlodipine and gently hydrate. Recheck electrolytes in a.m.   Marland Kitchen Hypertriglyceridemia - Continue gemfibrozil. Repeat lipid panel in the next 3-6 months.   . Diarrhea - Suspect this to be a chronic issue-she still has a flexiseal in place- will recheck C. difficile PCR. Continue probiotics. Please note, patient apparently was discharged with a  fexiseal and a Foley catheter a few days back.  Further plan will depend as patient's clinical course evolves and further radiologic and laboratory data become available. Patient will be monitored closely.  Above noted plan was discussed with patient, she was in agreement.   DVT Prophylaxis: SCD's  Code Status: Full Code  Total time spent for admission equals 45 minutes.  [facility] Pager [phone]  If 7PM-7AM, please contact night-coverage [email or web address] Password [email or web address] 09/16/2013, 7:15 PM  **Disclaimer: This note may have been dictated with voice recognition software. Similar sounding words can inadvertently be transcribed and this note may contain transcription errors which may not have been corrected upon publication of note.**

## 2013-09-16 NOTE — Progress Notes (Signed)
ANTIBIOTIC CONSULT NOTE - INITIAL  Pharmacy Consult for unasyn Indication: wound infxn  Allergies  Allergen Reactions  . Azithromycin Other (See Comments)    Nose bleeding event    Patient Measurements: Height:  (160 cm) Weight: 300 lb (136.079 kg) IBW/kg (Calculated) : 52.4 Adjusted Body Weight:   Vital Signs: Temp: 98 F (36.7 C) (08/06 1619) Temp src: Oral (08/06 1619) BP: 143/81 mmHg (08/06 1925) Pulse Rate: 117 (08/06 1925) Intake/Output from previous day:   Intake/Output from this shift:    Labs:  Recent Labs  09/16/13 1849  WBC 6.5  HGB 8.1*  PLT 457*  CREATININE 1.59*   Estimated Creatinine Clearance: 66.3 ml/min (by C-G formula based on Cr of 1.59). No results found for this basename: VANCOTROUGH, VANCOPEAK, VANCORANDOM, GENTTROUGH, GENTPEAK, GENTRANDOM, TOBRATROUGH, TOBRAPEAK, TOBRARND, AMIKACINPEAK, AMIKACINTROU, AMIKACIN,  in the last 72 hours   Microbiology: Recent Results (from the past 720 hour(s))  CULTURE, BLOOD (ROUTINE X 2)     Status: None   Collection Time    08/30/13  2:29 PM      Result Value Ref Range Status   Specimen Description BLOOD LEFT ANTECUBITAL   Final   Special Requests     Final   Value: BOTTLES DRAWN AEROBIC AND ANAEROBIC 6CC AER 5CC ANA   Culture  Setup Time     Final   Value: 08/30/2013 19:36     Performed at [facility]   Culture     Final   Value: NO GROWTH 5 DAYS     Performed at [facility]   Report Status 09/05/2013 FINAL   Final  CULTURE, BLOOD (ROUTINE X 2)     Status: None   Collection Time    08/30/13  2:41 PM      Result Value Ref Range Status   Specimen Description BLOOD LEFT HAND   Final   Special Requests BOTTLES DRAWN AEROBIC AND ANAEROBIC [facility]   Final   Culture  Setup Time     Final   Value: 08/30/2013 19:36     Performed at [facility]   Culture     Final   Value: STAPHYLOCOCCUS SPECIES (COAGULASE NEGATIVE)     Note: THE SIGNIFICANCE OF ISOLATING THIS ORGANISM FROM  A SINGLE SET OF BLOOD CULTURES WHEN MULTIPLE SETS ARE DRAWN IS UNCERTAIN. PLEASE NOTIFY THE MICROBIOLOGY DEPARTMENT WITHIN ONE WEEK IF SPECIATION AND SENSITIVITIES ARE REQUIRED.     Note: Gram Stain Report Called to,Read Back By and Verified With: SARA G@12 :54PM ON 08/31/13 BY DANTS     Performed at [facility]   Report Status 09/01/2013 FINAL   Final  WOUND CULTURE     Status: None   Collection Time    08/30/13  5:06 PM      Result Value Ref Range Status   Specimen Description WOUND THIGH LEFT   Final   Special Requests PATIENT ON FOLLOWING VANCOMYCIN, ZOSYN   Final   Gram Stain     Final   Value: RARE WBC PRESENT, PREDOMINANTLY MONONUCLEAR     NO SQUAMOUS EPITHELIAL CELLS SEEN     ABUNDANT GRAM NEGATIVE RODS     FEW GRAM POSITIVE COCCI IN PAIRS     IN CLUSTERS FEW GRAM POSITIVE RODS   Culture     Final   Value: NO GROWTH 2 DAYS     Performed at [facility]   Report Status 09/02/2013 FINAL   Final  ANAEROBIC CULTURE  Status: None   Collection Time    08/30/13  5:06 PM      Result Value Ref Range Status   Specimen Description WOUND THIGH LEFT   Final   Special Requests PATIENT ON FOLLOWING VANCOMYCIN, ZOSYN   Final   Gram Stain     Final   Value: RARE WBC PRESENT, PREDOMINANTLY MONONUCLEAR     NO SQUAMOUS EPITHELIAL CELLS SEEN     ABUNDANT GRAM NEGATIVE RODS     FEW GRAM POSITIVE COCCI IN PAIRS     IN CLUSTERS FEW GRAM POSITIVE RODS   Culture     Final   Value: ABUNDANT BACTEROIDES UNIFORMIS     Note: BETA LACTAMASE POSITIVE     Performed at [facility]   Report Status 09/06/2013 FINAL   Final  MRSA PCR SCREENING     Status: None   Collection Time    08/30/13  7:11 PM      Result Value Ref Range Status   MRSA by PCR NEGATIVE  NEGATIVE Final   Comment:            The GeneXpert MRSA Assay (FDA     approved for NASAL specimens     only), is one component of a     comprehensive MRSA colonization     surveillance program. It is not     intended  to diagnose MRSA     infection nor to guide or     monitor treatment for     MRSA infections.  URINE CULTURE     Status: None   Collection Time    08/30/13  7:12 PM      Result Value Ref Range Status   Specimen Description URINE, CATHETERIZED   Final   Special Requests NONE   Final   Culture  Setup Time     Final   Value: 08/31/2013 00:49     Performed at [facility]     Final   Value: NO GROWTH     Performed at [facility]   Culture     Final   Value: NO GROWTH     Performed at [facility]   Report Status 09/01/2013 FINAL   Final  CULTURE, BLOOD (ROUTINE X 2)     Status: None   Collection Time    09/01/13 11:10 AM      Result Value Ref Range Status   Specimen Description BLOOD LEFT ANTECUBITAL   Final   Special Requests BOTTLES DRAWN AEROBIC ONLY [facility]   Final   Culture  Setup Time     Final   Value: 09/01/2013 14:01     Performed at [facility]   Culture     Final   Value: NO GROWTH 5 DAYS     Performed at [facility]   Report Status 09/07/2013 FINAL   Final  CULTURE, BLOOD (ROUTINE X 2)     Status: None   Collection Time    09/01/13 11:20 AM      Result Value Ref Range Status   Specimen Description BLOOD LEFT HAND   Final   Special Requests BOTTLES DRAWN AEROBIC ONLY [facility]   Final   Culture  Setup Time     Final   Value: 09/01/2013 14:01     Performed at [facility]   Culture     Final   Value: NO GROWTH 5 DAYS     Performed  at [facility]   Report Status 09/07/2013 FINAL   Final  CULTURE, BLOOD (ROUTINE X 2)     Status: None   Collection Time    09/06/13  2:20 PM      Result Value Ref Range Status   Specimen Description BLOOD RIGHT THUMB   Final   Special Requests BOTTLES DRAWN AEROBIC AND ANAEROBIC 10CC   Final   Culture  Setup Time     Final   Value: 09/06/2013 19:16     Performed at [facility]   Culture     Final   Value: NO GROWTH 5 DAYS     Performed at [facility]   Report Status 09/12/2013 FINAL   Final  CULTURE, BLOOD (ROUTINE X 2)     Status: None   Collection Time    09/06/13  2:35 PM      Result Value Ref Range Status   Specimen Description BLOOD RIGHT HAND   Final   Special Requests BOTTLES DRAWN AEROBIC AND ANAEROBIC [facility]   Final   Culture  Setup Time     Final   Value: 09/06/2013 19:16     Performed at [facility]   Culture     Final   Value: NO GROWTH 5 DAYS     Performed at [facility]   Report Status 09/12/2013 FINAL   Final  CLOSTRIDIUM DIFFICILE BY PCR     Status: None   Collection Time    09/08/13 11:25 AM      Result Value Ref Range Status   C difficile by pcr NEGATIVE  NEGATIVE Final    Medical History: Past Medical History  Diagnosis Date  . Morbid obesity   . Hypertension   . CVA (cerebral infarction) 1990's    "writing is not the same since"  . Asthma   . Type II diabetes mellitus   . GERD (gastroesophageal reflux disease)     Medications:  Anti-infectives   Start     Dose/Rate Route Frequency Ordered Stop   09/16/13 2000  Ampicillin-Sulbactam (UNASYN) 3 g in sodium chloride 0.9 % 100 mL IVPB     3 g 100 mL/hr over 60 Minutes Intravenous Every 6 hours 09/16/13 1951       Assessment: 37 yof recently discharged to SNF from the hospital on cipro and doxycycline. Now presenting with bleeding from L thigh wound. To start unasyn for now. Pt is afebrile and WBC is WNL. Scr is elevated at 1.57.   Unasyn 8/6>>  Goal of Therapy:  Eradication of infection  Plan:  1. Unasyn 3gm IV Q6H 2. F/u renal fxn, C&S, clinical status   Koltin Wehmeyer, Drake Leach 09/16/2013,7:54 PM

## 2013-09-16 NOTE — ED Notes (Signed)
Per [facility], Pt was [facility] from left groin/leg wound more today than normal. Denies fevers.

## 2013-09-16 NOTE — Consult Note (Addendum)
  Patient well known to our service S/P debridement L proximal thigh 08/30/13 by Dr. Derrell Lollingamirez. [facility] from her wound. [facility]. Aledgedly,  the skilled nursing facility has refused to take her back.  On exam, her large L proximal thigh wound is mostly clean. There was a small area of bleeding along the upper skin edge. I controlled this with hand-held cautery and silver nitrate. Aa new wet to dry dressing was placed. She tolerated this well.  Agree with readmission to hospitalist service for wound care and placement. We will follow.  Violeta GelinasBurke Holger Sokolowski, MD, MPH, FACS Trauma: [phone] General Surgery: [phone]

## 2013-09-16 NOTE — ED Provider Notes (Signed)
CSN: [id number]     Arrival date & time 09/16/13  1603 History   First MD Initiated Contact with Patient 09/16/13 1611     Chief Complaint  Patient presents with  . Wound Complication       HPI Pt was seen at 1615. Per EMS and pt report, c/o gradual onset and worsening of persistent bleeding from left thigh wound since yesterday, worse today. Pt is s/p I&D of left upper thigh abscess on 08/30/13, currently residing at a NH for continued wound care. NH staff states they "did not feel comfortable" with the amount of bleeding from the wound, so pt was sent to [facility] ED for evaluation. Pt was tachycardic, with Hgb 8.6/HCT 27.2/plt 433 there. Pt was then sent to [facility] ED for evaluation by General Surgery and admission. EMS states while en route they reinforced the DSD with improvement in bleeding. Pt herself denies any other complaints. Denies fevers, no rash, no new injury, no purulent drainage.   Past Medical History  Diagnosis Date  . Morbid obesity   . Hypertension   . CVA (cerebral infarction) 1990's    "writing is not the same since"  . Asthma   . Type II diabetes mellitus   . GERD (gastroesophageal reflux disease)    Past Surgical History  Procedure Laterality Date  . Incision and drainage of wound Left 08/30/2013    thigh necrotic wound/notes 08/30/2013  . Irrigation and debridement abscess Left 08/30/2013    Procedure: IRRIGATION AND DEBRIDEMENT ABSCESS Left Thigh Necrotic Wound;  Surgeon: Ralene Ok, MD;  Location: [facility];  Service: General;  Laterality: Left;  . Tee without cardioversion N/A 09/07/2013    Procedure: TRANSESOPHAGEAL ECHOCARDIOGRAM (TEE);  Surgeon: Pixie Casino, MD;  Location: [facility] ENDOSCOPY;  Service: Cardiovascular;  Laterality: N/A;   Family History  Problem Relation Age of Onset  . Diabetes Mother   . Kidney disease Mother    History  Substance Use Topics  . Smoking status: Never Smoker   . Smokeless tobacco: Never Used  . Alcohol Use: No   OB  History   Grav Para Term Preterm Abortions TAB SAB Ect Mult Living   _0 0 0 0 0 0 0 3     Review of Systems ROS: Statement: All systems negative except as marked or noted in the HPI; Constitutional: Negative for fever and chills. ; ; Eyes: Negative for eye pain, redness and discharge. ; ; ENMT: Negative for ear pain, hoarseness, nasal congestion, sinus pressure and sore throat. ; ; Cardiovascular: Negative for chest pain, palpitations, diaphoresis, dyspnea and peripheral edema. ; ; Respiratory: Negative for cough, wheezing and stridor. ; ; Gastrointestinal: Negative for nausea, vomiting, diarrhea, abdominal pain, blood in stool, hematemesis, jaundice and rectal bleeding. . ; ; Genitourinary: Negative for dysuria, flank pain and hematuria. ; ; Musculoskeletal: Negative for back pain and neck pain. Negative for swelling and trauma.; ; Skin: +bleeding left thigh wound. Negative for pruritus, rash, abrasions, blisters, bruising and skin lesion.; ; Neuro: Negative for headache, lightheadedness and neck stiffness. Negative for weakness, altered level of consciousness , altered mental status, extremity weakness, paresthesias, involuntary movement, seizure and syncope.      Allergies  Azithromycin  Home Medications   Prior to Admission medications   Medication Sig Start Date End Date Taking? Authorizing Provider  acetaminophen (TYLENOL) 325 MG tablet Take 650 mg by mouth every 6 (six) hours as needed for mild pain, fever or headache. 09/09/13  Yes Cristal Ford,  DO  albuterol (PROVENTIL HFA;VENTOLIN HFA) 108 (90 BASE) MCG/ACT inhaler Inhale 2 puffs into the lungs every 4 (four) hours as needed for wheezing or shortness of breath. 02/21/13  Yes Margarita Mail, PA-C  Amino Acids-Protein Hydrolys (FEEDING SUPPLEMENT, PRO-STAT SUGAR FREE 64,) LIQD Take 30 mLs by mouth 2 (two) times daily with a meal. 09/09/13  Yes Maryann Mikhail, DO  antiseptic oral rinse (BIOTENE) LIQD 15 mLs by Mouth Rinse route daily  as needed for dry mouth. 09/09/13  Yes Maryann Mikhail, DO  arformoterol (BROVANA) 15 MCG/2ML NEBU Take 15 mcg by nebulization every 12 (twelve) hours. 09/09/13  Yes Maryann Mikhail, DO  aspirin EC 325 MG EC tablet Take 1 tablet (325 mg total) by mouth daily. 09/09/13  Yes Maryann Mikhail, DO  atorvastatin (LIPITOR) 20 MG tablet Take 1 tablet (20 mg total) by mouth daily at 6 PM. 09/09/13  Yes Maryann Mikhail, DO  budesonide (PULMICORT) 0.25 MG/2ML nebulizer solution Take 0.25 mg by nebulization every 12 (twelve) hours. 09/09/13  Yes Maryann Mikhail, DO  doxycycline (VIBRAMYCIN) 50 MG capsule Take 1 capsule (50 mg total) by mouth 2 (two) times daily. 09/09/13  Yes Maryann Mikhail, DO  gemfibrozil (LOPID) 600 MG tablet Take 1 tablet (600 mg total) by mouth 2 (two) times daily before a meal. 09/09/13  Yes Maryann Mikhail, DO  HYDROcodone-acetaminophen (NORCO/VICODIN) 5-325 MG per tablet Take 1 tablet by mouth every 6 (six) hours as needed for severe pain. 09/13/13  Yes Maryann Mikhail, DO  insulin aspart (NOVOLOG) 100 UNIT/ML injection Inject 0-20 Units into the skin 3 (three) times daily with meals. CBG < 70: implement hypoglycemia protocol CBG 70 - 120: 0 units CBG 121 - 150: 3 units CBG 151 - 200: 4 units CBG 201 - 250: 7 units CBG 251 - 300: 11 units CBG 301 - 350: 15 units CBG 351 - 400: 20 units 09/09/13  Yes Maryann Mikhail, DO  insulin glargine (LANTUS) 100 UNIT/ML injection Inject 0.22 mLs (22 Units total) into the skin daily. 09/09/13  Yes Maryann Mikhail, DO  loperamide (IMODIUM) 2 MG capsule Take 2 capsules (4 mg total) by mouth as needed for diarrhea or loose stools. 09/13/13  Yes Maryann Mikhail, DO  Multiple Vitamin (MULTIVITAMIN WITH MINERALS) TABS tablet Take 1 tablet by mouth daily. 09/09/13  Yes Maryann Mikhail, DO  saccharomyces boulardii (FLORASTOR) 250 MG capsule Take 250 mg by mouth 2 (two) times daily. 09/09/13  Yes Maryann Mikhail, DO  triamterene-hydrochlorothiazide (MAXZIDE) 75-50 MG  per tablet Take 2 tablets by mouth daily.    Yes Historical Provider, MD  insulin starter kit- syringes MISC 1 kit by Other route once. 09/09/13   Maryann Mikhail, DO   BP 135/96  Pulse 115  Temp(Src) 98 F (36.7 C) (Oral)  Resp 18  Ht _0  (1.6 m)  Wt 300 lb (136.079 kg)  BMI 53.16 kg/m2  SpO2 99%  LMP 08/11/2013 Physical Exam 1620: Physical examination:  Nursing notes reviewed; Vital signs and O2 SAT reviewed;  Constitutional: Well developed, Well nourished, Well hydrated, In no acute distress; Head:  Normocephalic, atraumatic; Eyes: EOMI, PERRL, No scleral icterus; ENMT: Mouth and pharynx normal, Mucous membranes moist; Neck: Supple, Full range of motion, No lymphadenopathy; Cardiovascular: Regular rate and rhythm, No murmur, rub, or gallop; Respiratory: Breath sounds clear & equal bilaterally, No rales, rhonchi, wheezes.  Speaking full sentences with ease, Normal respiratory effort/excursion; Chest: Nontender, Movement normal; Abdomen: Soft, Nontender, Nondistended, Normal bowel sounds; Genitourinary: No CVA tenderness; Extremities: Pulses normal, +  open left upper thigh wound packed with DSD. No obvious bleeding. No erythema, No deformity. No calf edema or asymmetry.; Neuro: AA&Ox3, Major CN grossly intact.  Speech clear. No gross focal motor or sensory deficits in extremities.; Skin: Color normal, Warm, Dry.   ED Course  Procedures   0223:  T/C back from General Surgery Dr. Grandville Silos, case discussed, including:  HPI, pertinent PM/SHx, VS/PE, dx testing, ED course and treatment:  Agreeable to evaluate pt in the ED/consult, requests to admit to [facility] service.    1835:  General Surgery Dr. Grandville Silos has evaluated pt at bedside; he successfully cauterized several bleeding areas in the wound.  T/C to [facility] Dr. Sloan Leiter, case discussed, including:  HPI, pertinent PM/SHx, VS/PE, dx testing, ED course and treatment:  Agreeable to admit, requests to write temporary orders, obtain medical bed to team  10.    MDM  MDM Reviewed: vitals, previous chart and nursing note Reviewed previous: labs Interpretation: labs       Francine Graven, DO 09/18/13 1400

## 2013-09-17 DIAGNOSIS — Y33XXXS Other specified events, undetermined intent, sequela: Secondary | ICD-10-CM

## 2013-09-17 DIAGNOSIS — K219 Gastro-esophageal reflux disease without esophagitis: Secondary | ICD-10-CM | POA: Diagnosis present

## 2013-09-17 DIAGNOSIS — L02419 Cutaneous abscess of limb, unspecified: Secondary | ICD-10-CM | POA: Diagnosis present

## 2013-09-17 DIAGNOSIS — E43 Unspecified severe protein-calorie malnutrition: Secondary | ICD-10-CM | POA: Diagnosis present

## 2013-09-17 DIAGNOSIS — D638 Anemia in other chronic diseases classified elsewhere: Secondary | ICD-10-CM | POA: Diagnosis present

## 2013-09-17 DIAGNOSIS — Z7982 Long term (current) use of aspirin: Secondary | ICD-10-CM | POA: Diagnosis not present

## 2013-09-17 DIAGNOSIS — E781 Pure hyperglyceridemia: Secondary | ICD-10-CM | POA: Diagnosis present

## 2013-09-17 DIAGNOSIS — N179 Acute kidney failure, unspecified: Secondary | ICD-10-CM | POA: Diagnosis present

## 2013-09-17 DIAGNOSIS — IMO0002 Reserved for concepts with insufficient information to code with codable children: Secondary | ICD-10-CM

## 2013-09-17 DIAGNOSIS — Z8673 Personal history of transient ischemic attack (TIA), and cerebral infarction without residual deficits: Secondary | ICD-10-CM | POA: Diagnosis not present

## 2013-09-17 DIAGNOSIS — N390 Urinary tract infection, site not specified: Secondary | ICD-10-CM | POA: Diagnosis present

## 2013-09-17 DIAGNOSIS — IMO0001 Reserved for inherently not codable concepts without codable children: Secondary | ICD-10-CM | POA: Diagnosis present

## 2013-09-17 DIAGNOSIS — I10 Essential (primary) hypertension: Secondary | ICD-10-CM | POA: Diagnosis present

## 2013-09-17 DIAGNOSIS — Z711 Person with feared health complaint in whom no diagnosis is made: Secondary | ICD-10-CM | POA: Diagnosis not present

## 2013-09-17 DIAGNOSIS — S79919A Unspecified injury of unspecified hip, initial encounter: Secondary | ICD-10-CM | POA: Diagnosis present

## 2013-09-17 DIAGNOSIS — Z794 Long term (current) use of insulin: Secondary | ICD-10-CM | POA: Diagnosis not present

## 2013-09-17 DIAGNOSIS — A419 Sepsis, unspecified organism: Secondary | ICD-10-CM | POA: Diagnosis present

## 2013-09-17 DIAGNOSIS — Y838 Other surgical procedures as the cause of abnormal reaction of the patient, or of later complication, without mention of misadventure at the time of the procedure: Secondary | ICD-10-CM | POA: Diagnosis present

## 2013-09-17 DIAGNOSIS — D509 Iron deficiency anemia, unspecified: Secondary | ICD-10-CM | POA: Diagnosis present

## 2013-09-17 DIAGNOSIS — F3289 Other specified depressive episodes: Secondary | ICD-10-CM | POA: Diagnosis present

## 2013-09-17 DIAGNOSIS — J45909 Unspecified asthma, uncomplicated: Secondary | ICD-10-CM | POA: Diagnosis present

## 2013-09-17 DIAGNOSIS — F329 Major depressive disorder, single episode, unspecified: Secondary | ICD-10-CM | POA: Diagnosis present

## 2013-09-17 DIAGNOSIS — Z6841 Body Mass Index (BMI) 40.0 and over, adult: Secondary | ICD-10-CM | POA: Diagnosis not present

## 2013-09-17 DIAGNOSIS — Z833 Family history of diabetes mellitus: Secondary | ICD-10-CM | POA: Diagnosis not present

## 2013-09-17 DIAGNOSIS — S79929A Unspecified injury of unspecified thigh, initial encounter: Secondary | ICD-10-CM | POA: Diagnosis present

## 2013-09-17 DIAGNOSIS — R197 Diarrhea, unspecified: Secondary | ICD-10-CM | POA: Diagnosis present

## 2013-09-17 LAB — URINALYSIS, ROUTINE W REFLEX MICROSCOPIC
Bilirubin Urine: NEGATIVE
Glucose, UA: NEGATIVE mg/dL
Ketones, ur: NEGATIVE mg/dL
Nitrite: NEGATIVE
PROTEIN: 100 mg/dL — AB
Specific Gravity, Urine: 1.02 (ref 1.005–1.030)
UROBILINOGEN UA: 0.2 mg/dL (ref 0.0–1.0)
pH: 5 (ref 5.0–8.0)

## 2013-09-17 LAB — CBC
HEMATOCRIT: 22.2 % — AB (ref 36.0–46.0)
Hemoglobin: 7.1 g/dL — ABNORMAL LOW (ref 12.0–15.0)
MCH: 26.6 pg (ref 26.0–34.0)
MCHC: 32 g/dL (ref 30.0–36.0)
MCV: 83.1 fL (ref 78.0–100.0)
PLATELETS: 396 10*3/uL (ref 150–400)
RBC: 2.67 MIL/uL — ABNORMAL LOW (ref 3.87–5.11)
RDW: 15.2 % (ref 11.5–15.5)
WBC: 6.9 10*3/uL (ref 4.0–10.5)

## 2013-09-17 LAB — BASIC METABOLIC PANEL
ANION GAP: 12 (ref 5–15)
BUN: 20 mg/dL (ref 6–23)
CALCIUM: 8.4 mg/dL (ref 8.4–10.5)
CO2: 20 meq/L (ref 19–32)
Chloride: 106 mEq/L (ref 96–112)
Creatinine, Ser: 1.64 mg/dL — ABNORMAL HIGH (ref 0.50–1.10)
GFR, EST AFRICAN AMERICAN: 46 mL/min — AB (ref >90)
GFR, EST NON AFRICAN AMERICAN: 39 mL/min — AB (ref >90)
Glucose, Bld: 132 mg/dL — ABNORMAL HIGH (ref 70–99)
Potassium: 5.3 mEq/L (ref 3.7–5.3)
Sodium: 138 mEq/L (ref 137–147)

## 2013-09-17 LAB — URINE MICROSCOPIC-ADD ON

## 2013-09-17 LAB — PREPARE RBC (CROSSMATCH)

## 2013-09-17 LAB — GLUCOSE, CAPILLARY
GLUCOSE-CAPILLARY: 139 mg/dL — AB (ref 70–99)
GLUCOSE-CAPILLARY: 159 mg/dL — AB (ref 70–99)
Glucose-Capillary: 139 mg/dL — ABNORMAL HIGH (ref 70–99)
Glucose-Capillary: 135 mg/dL — ABNORMAL HIGH (ref 70–99)

## 2013-09-17 LAB — CLOSTRIDIUM DIFFICILE BY PCR: Toxigenic C. Difficile by PCR: NEGATIVE

## 2013-09-17 LAB — ABO/RH: ABO/RH(D): A POS

## 2013-09-17 MED ORDER — SODIUM CHLORIDE 0.9 % IV SOLN: Freq: Once | INTRAVENOUS | Status: DC

## 2013-09-17 NOTE — Progress Notes (Signed)
[facility] PROGRESS NOTE  [name] HYI:[id number] DOB: 04/10/1975 DOA: 09/16/2013 PCP: Lonia Blood, MD  Assessment/Plan: Principal Problem:   Bleeding from wound Active Problems:   Diabetes mellitus with hyperglycemia   AKI (acute kidney injury)   Cerebral embolism with cerebral infarction   Hypertriglyceridemia   Diarrhea    Bleeding from wound  - Patient will be admitted overnight for further observation, Gen. surgery has already evaluated the patient and a bedside hand-held cautery and silver nitrate was used to control bleeding. Dressing is in place, it is currently dry and no soakage is seen. Probably be discharged back to the facility tomorrow. Follow culture data Prior admission cultures, anaerobic culture was positive for bacteroids- therefore continue Unasyn. Please note she was discharged on ciprofloxacin and doxycycline, suspect we can discharge her on Augmentin instead.   Urinary tract infection Continue Unasyn Follow urine culture Patient has a Foley in place  Anemia Hemoglobin down to 7.1 We'll transfuse 1 unit of packed red blood cells  . Diabetes mellitus with hyperglycemia  - Continue Lantus, place on SSI. Follow CBGs.   . Cerebral embolism with cerebral infarction  - Hold aspirin for today, if no further bleeding, resume tomorrow. Currently without any focal deficits.   Marland Kitchen AKI (acute kidney injury)  - Labs from [facility] indicate acute elevation of creatinine, she was on diuretic therapy as an outpatient. We'll hold all diuretics, place on amlodipine and gently hydrate. Recheck electrolytes in a.m.   Marland Kitchen Hypertriglyceridemia  - Continue gemfibrozil. Repeat lipid panel in the next 3-6 months.   . Diarrhea  - Suspect this to be a chronic issue-she still has a flexiseal in place- will recheck C. difficile PCR. Continue probiotics. Please note, patient apparently was discharged with a fexiseal and a Foley catheter a few days back.      DVT Prophylaxis:  SCD's  Code Status:  Full Code    Code Status: full Family Communication: family updated about patient's clinical progress Disposition Plan:  As above    Brief narrative: [name] is a 38 y.o. female with a Past Medical History of recent left eye abscess status post incision and drainage, recent sepsis secondary to left thigh abscess, embolic CVA, diabetes, hypertriglyceridemia who presents today with the above noted complaint. Patient was just discharged from this facility a few days back and transferred to a skilled nursing facility for further care. For the past 2 days, patient has noted bleeding from her left thigh wound. She was taken to [facility], and transferred to [facility] for further evaluation and treatment. In the emergency room, she was evaluated by Dr. Janee Morn from [facility] [place] [facility] controlled this meeting with a hand-held cautery and silver nitrate. Since the patient was just discharged from the hospitalist service, I was asked to admit this patient for further observation.  Please note, patient has a Foley catheter and a flexiseal in place, per her these have been in place since her last admission.  Patient denies any fever, chest pain, shortness of breath, nausea, vomiting. Review of last discharge summary, indicates diarrhea as an issue for which she has a flexiseal, however C Diff PCR was negative.  Please note, no labs hematocrit noted for today, patient had labs at [facility] which showed a creatinine of 1.79, WBC of 6.5, hemoglobin of 8.6 and a platelet count of 433,000. Rest of her labs were generally within normal limits.   Consultants:  Surgery  Procedures:  Antibiotics:  Unasyn  HPI/Subjective: Does not report any pain, Does not know how long the Flexiseal has been in place  Objective: Filed Vitals:   09/16/13 1925 09/16/13 2032 09/17/13 0607 09/17/13  1000  BP: 143/81 141/92 150/83 138/71  Pulse: 117 114 105 102  Temp:  98.6 F (37 C) 97.6 F (36.4 C) 98.1 F (36.7 C)  TempSrc:  Oral Axillary Oral  Resp: 16 20 20 16   Height:      Weight:  136.079 kg (300 lb)    SpO2: 100% 100% 99%     Intake/Output Summary (Last 24 hours) at 09/17/13 1026 Last data filed at 09/17/13 0608  Gross per 24 hour  Intake    350 ml  Output    577 ml  Net   -227 ml    Exam:  General: alert & oriented x 3 In NAD  Cardiovascular: RRR, nl S1 s2  Respiratory: Decreased breath sounds at the bases, scattered rhonchi, no crackles  Abdomen: soft +BS NT/ND, no masses palpable  Extremities: No cyanosis and no edema      Data Reviewed: Basic Metabolic Panel:  Recent Labs Lab 09/16/13 1849 09/17/13 0550  NA 137 138  K 5.4* 5.3  CL 105 106  CO2 20 20  GLUCOSE 127* 132*  BUN 17 20  CREATININE 1.59* 1.64*  CALCIUM 8.3* 8.4    Liver Function Tests: No results found for this basename: AST, ALT, ALKPHOS, BILITOT, PROT, ALBUMIN,  in the last 168 hours No results found for this basename: LIPASE, AMYLASE,  in the last 168 hours No results found for this basename: AMMONIA,  in the last 168 hours  CBC:  Recent Labs Lab 09/16/13 1849 09/17/13 0550  WBC 6.5 6.9  NEUTROABS 3.5  --   HGB 8.1* 7.1*  HCT 25.8* 22.2*  MCV 84.3 83.1  PLT 457* 396    Cardiac Enzymes: No results found for this basename: CKTOTAL, CKMB, CKMBINDEX, TROPONINI,  in the last 168 hours BNP (last 3 results) No results found for this basename: PROBNP,  in the last 8760 hours   CBG:  Recent Labs Lab 09/12/13 2115 09/13/13 0650 09/13/13 1157 09/16/13 2102 09/17/13 0635  GLUCAP 78 70 78 153* 139*    Recent Results (from the past 240 hour(s))  CLOSTRIDIUM DIFFICILE BY PCR     Status: None   Collection Time    09/08/13 11:25 AM      Result Value Ref Range Status   C difficile by pcr NEGATIVE  NEGATIVE Final  CLOSTRIDIUM DIFFICILE BY PCR     Status: None    Collection Time    09/17/13  2:46 AM      Result Value Ref Range Status   C difficile by pcr NEGATIVE  NEGATIVE Final     Studies: Ct Angio Head W/cm &/or Wo Cm  09/05/2013   CLINICAL DATA:  Diabetes, hypertension, and prior stroke with left lower extremity abscess and sepsis. Multiple, likely embolic infarcts on MRI. Stroke workup.  EXAM: CT ANGIOGRAPHY HEAD AND NECK  TECHNIQUE: Multidetector CT imaging of the head and neck was performed using the standard protocol during bolus administration of intravenous contrast. Multiplanar CT image reconstructions and MIPs were obtained to evaluate the vascular anatomy. Carotid stenosis measurements (when applicable) are obtained utilizing NASCET criteria, using the distal internal carotid diameter as the denominator.  CONTRAST:  50mL OMNIPAQUE IOHEXOL 350 MG/ML SOLN  COMPARISON:  Brain MRI 09/03/2013  FINDINGS: CTA HEAD FINDINGS  Small foci  of hypoattenuation are seen in the anterior body and genu of the corpus callosum, right globus pallidus, and left posterior left temporal periventricular white matter corresponding to acute infarcts described on recent MRI additional, punctate acute infarcts on the MRI are not well demonstrated on the CT. Remote infarcts are noted in both cerebellar hemispheres and in the left basal ganglia/ corona radiata. There is no evidence of acute large territory cortical infarct, intracranial hemorrhage, midline shift, or extra-axial fluid collection. There is mild ex vacuo dilatation of the body of the left lateral ventricle. No abnormal enhancement is identified. Orbits are unremarkable. Mastoid air cells and paranasal sinuses are clear.  The intracranial right vertebral artery is patent, dominant, and supplies the basilar. The left intracranial vertebral artery is markedly small in caliber and only intermittently patent. PICA origins appear patent. AICA and SCA origins are patent. Basilar artery is mildly irregular but without stenosis.  There is a focal, mild-to-moderate distal right P1 stenosis. There is irregular, mild to moderate attenuation of left P2 and P3 branches. Posterior communicating arteries are not clearly identified.  The proximal right intracranial ICA is diffusely small in caliber with a severe stenosis at the petrous-cavernous junction. The right ICA becomes occluded at the anterior cavernous segment. There is minimal to no reconstitution of the right intracranial ICA distal to this. Multiple collateral vessels are seen in the expected region of the right ICA terminus. These collateral vessels reconstitute the right MCA near the MCA bifurcation. Major right M2 branches appear patent although mildly smaller in caliber than those on the left. A small amount of contrast is present in the right A1 segment, with the A2 being supplied predominantly by the left ACA via the anterior communicating artery, which appears somewhat bulbous.  The left internal carotid artery is patent from skullbase to carotid terminus. There is mild calcification and moderate irregularity of the left carotid siphon. There is mild narrowing of the left ICA at the petrous-cavernous junction. There is diffuse irregularity with mild to moderate tandem stenoses involving the anterior genu of the left cavernous carotid as well as the supraclinoid left ICA. There is mild stenosis of the proximal left A1 segment. There is also mild proximal left M1 stenosis. There is mild, diffuse irregularity of left M2 and more distal branches.  Review of the MIP images confirms the above findings.  CTA NECK FINDINGS  Three vessel aortic arch. Subclavian arteries are grossly unremarkable. Evaluation of the proximal common carotid arteries is limited by image noise due to shoulder attenuation as well as mild motion artifact. No mid or distal common carotid artery stenosis is seen. There is moderate focal narrowing of the right internal carotid artery at its origin. The right ICA  obtains a more normal caliber for approximately 1 cm beyond the stenosis before becoming narrowed again and demonstrating a diffusely narrowed but smooth luminal contour for the remainder of its course in the neck. The right ICA lumen near its origin at the site of proximal stenosis measures approximately 2.3 mm in diameter, with the diffusely narrowed mid to distal right cervical ICA measuring approximately 2.7 mm in diameter. The left cervical ICA is mildly tortuous but patent and without stenosis.  The proximal right vertebral artery is grossly patent but suboptimally evaluated due to artifact. The V2 and V3 segments of the right vertebral artery are normal in appearance. The left vertebral artery appears occluded at its origin with reconstitution at the C4 level, with the lumen of the vertebral artery  being markedly small in caliber distal to this.  A left subclavian approach central venous catheter is partially visualized. Jugulodigastric lymph nodes measure up to 10 mm in short axis.  Review of the MIP images confirms the above findings.  IMPRESSION: 1. Occlusion of the intracranial right ICA. Multiple small collateral vessels in the region of the right ICA terminus is indicative of chronic occlusion. Right MCA is reconstituted new the MCA bifurcation, with slightly diminished distal flow compared to the left. Right ACA is mainly supplied via the anterior communicating artery. Right ICA is diffusely small in the neck, with moderate stenosis at its origin. 2. Moderate left carotid siphon atherosclerosis. Mild to moderate proximal right PCA stenosis. Mild to moderate attenuation of distal left PCA branch vessels. 3. 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 4. Dominant right vertebral artery without stenosis identified. 5. Small, acute bilateral infarcts as described on recent MRI.   Electronically Signed   By: Sebastian Ache   On: 09/05/2013 20:48    Ct Angio Neck W/cm &/or Wo/cm  09/05/2013   CLINICAL DATA:  Diabetes, hypertension, and prior stroke with left lower extremity abscess and sepsis. Multiple, likely embolic infarcts on MRI. Stroke workup.  EXAM: CT ANGIOGRAPHY HEAD AND NECK  TECHNIQUE: Multidetector CT imaging of the head and neck was performed using the standard protocol during bolus administration of intravenous contrast. Multiplanar CT image reconstructions and MIPs were obtained to evaluate the vascular anatomy. Carotid stenosis measurements (when applicable) are obtained utilizing NASCET criteria, using the distal internal carotid diameter as the denominator.  CONTRAST:  50mL OMNIPAQUE IOHEXOL 350 MG/ML SOLN  COMPARISON:  Brain MRI 09/03/2013  FINDINGS: CTA HEAD FINDINGS  Small foci of hypoattenuation are seen in the anterior body and genu of the corpus callosum, right globus pallidus, and left posterior left temporal periventricular white matter corresponding to acute infarcts described on recent MRI additional, punctate acute infarcts on the MRI are not well demonstrated on the CT. Remote infarcts are noted in both cerebellar hemispheres and in the left basal ganglia/ corona radiata. There is no evidence of acute large territory cortical infarct, intracranial hemorrhage, midline shift, or extra-axial fluid collection. There is mild ex vacuo dilatation of the body of the left lateral ventricle. No abnormal enhancement is identified. Orbits are unremarkable. Mastoid air cells and paranasal sinuses are clear.  The intracranial right vertebral artery is patent, dominant, and supplies the basilar. The left intracranial vertebral artery is markedly small in caliber and only intermittently patent. PICA origins appear patent. AICA and SCA origins are patent. Basilar artery is mildly irregular but without stenosis. There is a focal, mild-to-moderate distal right P1 stenosis. There is irregular, mild to moderate attenuation of left P2 and P3  branches. Posterior communicating arteries are not clearly identified.  The proximal right intracranial ICA is diffusely small in caliber with a severe stenosis at the petrous-cavernous junction. The right ICA becomes occluded at the anterior cavernous segment. There is minimal to no reconstitution of the right intracranial ICA distal to this. Multiple collateral vessels are seen in the expected region of the right ICA terminus. These collateral vessels reconstitute the right MCA near the MCA bifurcation. Major right M2 branches appear patent although mildly smaller in caliber than those on the left. A small amount of contrast is present in the right A1 segment, with the A2 being supplied predominantly by the left ACA via the anterior communicating artery, which appears somewhat bulbous.  The left internal carotid artery is patent from skullbase to carotid terminus. There is mild calcification and moderate irregularity of the left carotid siphon. There is mild narrowing of the left ICA at the petrous-cavernous junction. There is diffuse irregularity with mild to moderate tandem stenoses involving the anterior genu of the left cavernous carotid as well as the supraclinoid left ICA. There is mild stenosis of the proximal left A1 segment. There is also mild proximal left M1 stenosis. There is mild, diffuse irregularity of left M2 and more distal branches.  Review of the MIP images confirms the above findings.  CTA NECK FINDINGS  Three vessel aortic arch. Subclavian arteries are grossly unremarkable. Evaluation of the proximal common carotid arteries is limited by image noise due to shoulder attenuation as well as mild motion artifact. No mid or distal common carotid artery stenosis is seen. There is moderate focal narrowing of the right internal carotid artery at its origin. The right ICA obtains a more normal caliber for approximately 1 cm beyond the stenosis before becoming narrowed again and demonstrating a  diffusely narrowed but smooth luminal contour for the remainder of its course in the neck. The right ICA lumen near its origin at the site of proximal stenosis measures approximately 2.3 mm in diameter, with the diffusely narrowed mid to distal right cervical ICA measuring approximately 2.7 mm in diameter. The left cervical ICA is mildly tortuous but patent and without stenosis.  The proximal right vertebral artery is grossly patent but suboptimally evaluated due to artifact. The V2 and V3 segments of the right vertebral artery are normal in appearance. The left vertebral artery appears occluded at its origin with reconstitution at the C4 level, with the lumen of the vertebral artery being markedly small in caliber distal to this.  A left subclavian approach central venous catheter is partially visualized. Jugulodigastric lymph nodes measure up to 10 mm in short axis.  Review of the MIP images confirms the above findings.  IMPRESSION: 1. Occlusion of the intracranial right ICA. Multiple small collateral vessels in the region of the right ICA terminus is indicative of chronic occlusion. Right MCA is reconstituted new the MCA bifurcation, with slightly diminished distal flow compared to the left. Right ACA is mainly supplied via the anterior communicating artery. Right ICA is diffusely small in the neck, with moderate stenosis at its origin. 2. Moderate left carotid siphon atherosclerosis. Mild to moderate proximal right PCA stenosis. Mild to moderate attenuation of distal left PCA branch vessels. 3. 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 4. Dominant right vertebral artery without stenosis identified. 5. Small, acute bilateral infarcts as described on recent MRI.   Electronically Signed   By: Sebastian Ache   On: 09/05/2013 20:48   Mr Brain Wo Contrast  09/03/2013   CLINICAL DATA:  Change in level of consciousness. Rule out stroke. Thigh abscess with  sepsis.  EXAM: MRI HEAD WITHOUT CONTRAST  TECHNIQUE: Multiplanar, multiecho pulse sequences of the brain and surrounding structures were obtained without intravenous contrast.  COMPARISON:  None.  FINDINGS: Images are mildly to moderately degraded by motion. There are numerous punctate foci of restricted diffusion involving cortex and white matter of both cerebral hemispheres consistent with acute infarcts. Several slightly larger foci of acute infarction are present in the corpus callosum measuring up to approximately 1.3 cm in size. Small, acute infarcts are also present in the right greater than left basal ganglia, and there is a punctate  acute infarct anteriorly in the right aspect of the medulla. There may be a small amount of hemorrhage associated with a few of these punctate infarcts. Old infarcts are present in the bilateral basal ganglia and bilateral cerebellum.  There is no mass, midline shift, or extra-axial fluid collection. There is mild cerebral and cerebellar atrophy. Orbits are unremarkable. Minimal ethmoid air cell mucosal thickening is noted. Mastoid air cells are clear.  IMPRESSION: 1. Multiple small infarcts involving both cerebral hemispheres, corpus callosum, and medulla, consistent with embolic infarcts. These could be bland or septic emboli given history. 2. 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   Electronically Signed   By: Sebastian AcheAllen  Grady   On: 09/03/2013 17:38   Dg Chest Port 1 View  09/05/2013   CLINICAL DATA:  Left-sided PICC placement.  EXAM: PORTABLE CHEST - 1 VIEW  COMPARISON:  Chest x-ray 08/31/2013.  FINDINGS: There is a left upper extremity PICC with tip terminating in the superior aspect of the right atrium (approximately 3 cm beyond the superior cavoatrial junction). No pneumothorax. No acute consolidative airspace disease. No pleural effusions. No evidence of pulmonary edema. Mild cardiomegaly. Upper mediastinal contours are within normal limits.  IMPRESSION: 1. Tip of  PICC is in the right atrium approximately 3 cm distal to the superior cavoatrial junction. 2. Status post extubation with significantly improved aeration. 3. Mild cardiomegaly.   Electronically Signed   By: Trudie Reedaniel  Entrikin M.D.   On: 09/05/2013 14:21   Portable Chest Xray In Am  08/31/2013   CLINICAL DATA:  Respiratory failure.  EXAM: PORTABLE CHEST - 1 VIEW  COMPARISON:  08/30/2013.  FINDINGS: The endotracheal tube is 4 cm above the carina. The NG tube is coursing down the esophagus and into the stomach. The heart is enlarged but unchanged. Persistent mild perihilar edema and probable small effusions.  IMPRESSION: Stable support apparatus.  Persistent perihilar edema and areas of atelectasis.   Electronically Signed   By: Loralie ChampagneMark  Gallerani M.D.   On: 08/31/2013 07:29   Dg Chest Port 1 View  08/30/2013   CLINICAL DATA:  Endotracheal tube placement.  EXAM: PORTABLE CHEST - 1 VIEW  COMPARISON:  08/30/2013 and 02/21/2013.  FINDINGS: 2214 hr. Endotracheal tube appears unchanged, within the mid trachea. There is improved aeration of the lungs with residual patchy left greater than right basilar airspace opacities. There is no pleural effusion or pneumothorax. The heart size and mediastinal contours are stable.  IMPRESSION: Interval improved aeration of the lung bases. Endotracheal tube appears unchanged within the midtrachea.   Electronically Signed   By: Roxy HorsemanBill  Veazey M.D.   On: 08/30/2013 23:16   Portable Chest Xray  08/30/2013   CLINICAL DATA:  Endotracheal tube placement  EXAM: PORTABLE CHEST - 1 VIEW  COMPARISON:  Chest radiograph 02/21/2013  FINDINGS: Markedly low lung volumes limit evaluation. The endotracheal tube terminates within the distal trachea approximately 5 mm superior to the carina. Cardiomegaly. Heterogeneous lower lung opacities, left greater than right. No pleural effusion or pneumothorax.  IMPRESSION: Endotracheal tube terminates within the distal trachea. Recommend retraction and  re-evaluation with repeat radiograph.  Scattered heterogeneous opacities favored to represent atelectasis in the setting of markedly low lung volumes.  Discussed with Nurse Clarene CritchleyLindsey Varner at 8:43 p.m. on 08/30/2013.   Electronically Signed   By: Annia Beltrew  Davis M.D.   On: 08/30/2013 20:44   Dg Abd Portable 1v  08/31/2013   CLINICAL DATA:  Enteric tube placement.  EXAM: PORTABLE ABDOMEN - 1 VIEW  COMPARISON:  Lumbar spine radiographs performed 09/26/2011  FINDINGS: The patient's enteric tube is seen ending at the body of the stomach, with the sideport at the fundus of the stomach.  The visualized bowel gas pattern is grossly unremarkable, with scattered air noted in the colon. No free intra-abdominal air is identified, though evaluation for free air is limited on a single supine view and due to motion artifact. No acute osseous abnormalities are seen.  IMPRESSION: Enteric tube is seen ending at the body of the stomach, with the sideport at the fundus of the stomach.   Electronically Signed   By: Roanna Raider M.D.   On: 08/31/2013 05:45    Scheduled Meds: . sodium chloride   Intravenous Once  . amLODipine  10 mg Oral Daily  . ampicillin-sulbactam (UNASYN) IV  3 g Intravenous Q6H  . arformoterol  15 mcg Nebulization Q12H  . aspirin EC  325 mg Oral Daily  . atorvastatin  20 mg Oral q1800  . budesonide  0.25 mg Nebulization Q12H  . feeding supplement (PRO-STAT SUGAR FREE 64)  30 mL Oral BID WC  . gemfibrozil  600 mg Oral BID AC  . insulin aspart  0-15 Units Subcutaneous TID WC  . insulin glargine  22 Units Subcutaneous Daily  . multivitamin with minerals  1 tablet Oral Daily  . saccharomyces boulardii  250 mg Oral BID   Continuous Infusions: . sodium chloride 100 mL/hr at 09/17/13 7829    Principal Problem:   Bleeding from wound Active Problems:   Diabetes mellitus with hyperglycemia   AKI (acute kidney injury)   Cerebral embolism with cerebral infarction   Hypertriglyceridemia    Diarrhea    Time spent: 40 minutes   [facility]  [facility] Pager [phone]. If 8PM-8AM, please contact night-coverage at [email or web address], password [facility] 09/17/2013, 10:26 AM  LOS: 1 day

## 2013-09-17 NOTE — Progress Notes (Signed)
UR completed 

## 2013-09-17 NOTE — Progress Notes (Signed)
Patient ID: Herold HarmsDavenia [name], female   DOB: 05/31/1975, 38 y.o.   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  No further bleeding.  Lab Results:   Recent Labs  09/16/13 1849 09/17/13 0550  WBC 6.5 6.9  HGB 8.1* 7.1*  HCT 25.8* 22.2*  PLT 457* 396   BMET  Recent Labs  09/16/13 1849 09/17/13 0550  NA 137 138  K 5.4* 5.3  CL 105 106  CO2 20 20  GLUCOSE 127* 132*  BUN 17 20  CREATININE 1.59* 1.64*  CALCIUM 8.3* 8.4   PT/INR No results found for this basename: LABPROT, INR,  in the last 72 hours CMP     Component Value Date/Time   NA 138 09/17/2013 0550   K 5.3 09/17/2013 0550   CL 106 09/17/2013 0550   CO2 20 09/17/2013 0550   GLUCOSE 132* 09/17/2013 0550   BUN 20 09/17/2013 0550   CREATININE 1.64* 09/17/2013 0550   CALCIUM 8.4 09/17/2013 0550   PROT 6.9 08/31/2013 0220   ALBUMIN 1.5* 08/31/2013 0220   AST 27 08/31/2013 0220   ALT 19 08/31/2013 0220   ALKPHOS 154* 08/31/2013 0220   BILITOT 0.2* 08/31/2013 0220   GFRNONAA 39* 09/17/2013 0550   GFRAA 46* 09/17/2013 0550   Lipase  No results found for this basename: lipase       Studies/Results: No results found.  Anti-infectives: Anti-infectives   Start     Dose/Rate Route Frequency Ordered Stop   09/16/13 2000  Ampicillin-Sulbactam (UNASYN) 3 g in sodium chloride 0.9 % 100 mL IVPB     3 g 100 mL/hr over 60 Minutes Intravenous Every 6  hours 09/16/13 1951         Assessment/Plan  1. 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 Bleeding from wound, ceased  3. Anemia  Plan: 1. Patient's wound is stable.  No further bleeding 2. Cont BID dressing changes 3. Surgically stable for dc when medically stable.  LOS: 1 day    Briah Nary E 09/17/2013, 10:38 AM Pager: [phone]

## 2013-09-17 NOTE — Progress Notes (Signed)
Wound is ok. Bleeding stopped  Placement  Mary SellaEric M. Andrey CampanileWilson, MD, FACS General, Bariatric, & Minimally Invasive Surgery [facility] [place]

## 2013-09-17 NOTE — Social Work (Signed)
Clinical Social Work Department BRIEF PSYCHOSOCIAL ASSESSMENT 09/17/2013  Patient:  [name],[name]     Account Number:  [id number]     Admit date:  09/16/2013  Clinical Social Worker:  Cristy FolksBEST,Anabella Capshaw, LCSW  Date/Time:  09/17/2013 03:32 PM  Referred by:  RN  Date Referred:  09/17/2013  Other Referral:  N/A Interview type:  Patient Other interview type:    PSYCHOSOCIAL DATA Living Status:  FAMILY [facility] Level of care:   Primary support name:  [name] Primary support relationship to patient:  husband Degree of support available:   Adequate support    CURRENT CONCERNS  Other Concerns:    SOCIAL WORK ASSESSMENT / PLAN Pt admitted [facility] 09/16/13 [facility] who reported that they are not able to care for pt and will not allow her to return. Pt informed. SW shared with pt that [facility]. However, pt cnahged her mind and would like to return home. RN CM, Vance PeperSusan Brady spoke with pt and family to discuss possibility. Attending physician made aware and a PT/OT referral made. Pt is married and has 3 small children at home.   Assessment/plan status:  Psychosocial Support/Ongoing Assessment of Needs Other assessment/ plan:   N/A   Information/referral to community resources:   Pt requested information to apply for Social Security Disability. Information given.    PATIENT'S/FAMILY'S RESPONSE TO PLAN OF CARE: SW will continue to follow.   Derrell Lollingoris Aurore Redinger, MSW Clinical Social Worker [phone]

## 2013-09-18 LAB — COMPREHENSIVE METABOLIC PANEL
ALK PHOS: 53 U/L (ref 39–117)
ALT: 8 U/L (ref 0–35)
ANION GAP: 11 (ref 5–15)
AST: 10 U/L (ref 0–37)
Albumin: 1.5 g/dL — ABNORMAL LOW (ref 3.5–5.2)
BUN: 18 mg/dL (ref 6–23)
CO2: 22 mEq/L (ref 19–32)
Calcium: 8.3 mg/dL — ABNORMAL LOW (ref 8.4–10.5)
Chloride: 106 mEq/L (ref 96–112)
Creatinine, Ser: 1.43 mg/dL — ABNORMAL HIGH (ref 0.50–1.10)
GFR calc Af Amer: 54 mL/min — ABNORMAL LOW (ref >90)
GFR calc non Af Amer: 46 mL/min — ABNORMAL LOW (ref >90)
GLUCOSE: 115 mg/dL — AB (ref 70–99)
POTASSIUM: 4.5 meq/L (ref 3.7–5.3)
Sodium: 139 mEq/L (ref 137–147)
TOTAL PROTEIN: 6.1 g/dL (ref 6.0–8.3)

## 2013-09-18 LAB — CBC
HCT: 22 % — ABNORMAL LOW (ref 36.0–46.0)
HEMOGLOBIN: 7.1 g/dL — AB (ref 12.0–15.0)
MCH: 27.5 pg (ref 26.0–34.0)
MCHC: 32.3 g/dL (ref 30.0–36.0)
MCV: 85.3 fL (ref 78.0–100.0)
Platelets: 371 10*3/uL (ref 150–400)
RBC: 2.58 MIL/uL — ABNORMAL LOW (ref 3.87–5.11)
RDW: 15 % (ref 11.5–15.5)
WBC: 6.5 10*3/uL (ref 4.0–10.5)

## 2013-09-18 LAB — URINE CULTURE: Colony Count: 45000

## 2013-09-18 LAB — GLUCOSE, CAPILLARY
Glucose-Capillary: 110 mg/dL — ABNORMAL HIGH (ref 70–99)
Glucose-Capillary: 106 mg/dL — ABNORMAL HIGH (ref 70–99)
Glucose-Capillary: 98 mg/dL (ref 70–99)
Glucose-Capillary: 96 mg/dL (ref 70–99)

## 2013-09-18 LAB — PREPARE RBC (CROSSMATCH)

## 2013-09-18 MED ORDER — INSULIN GLARGINE 100 UNIT/ML ~~LOC~~ SOLN
10.0000 [IU] | Freq: Every day | SUBCUTANEOUS | Status: DC
  Administered 2013-09-18 – 2013-09-20 (×3): 10 [IU] via SUBCUTANEOUS
  Filled 2013-09-18 (×3): qty 0.1

## 2013-09-18 MED ORDER — SODIUM CHLORIDE 0.9 % IV SOLN: Freq: Once | INTRAVENOUS | Status: DC

## 2013-09-18 NOTE — Progress Notes (Signed)
Notified MD of CBG of 106 and order to give 22 units of Lantus. Order given for 10 units to give now in place of 22 units. Will continue to monitor.

## 2013-09-18 NOTE — Progress Notes (Signed)
[facility] PROGRESS NOTE  [name] OZH:[id number] DOB: [birth year] DOA: 09/16/2013 PCP: Lonia Blood, MD  Assessment/Plan: Principal Problem:   Bleeding from wound Active Problems:   Diabetes mellitus with hyperglycemia   AKI (acute kidney injury)   Cerebral embolism with cerebral infarction   Hypertriglyceridemia   Diarrhea   Bleeding from wound  General surgery used hand-held cautery and silver nitrate to control bleeding. Dressing is in place, it is currently dry and no soakage is seen.  Hemoglobin still low but no active bleeding Culture no growth so far Prior admission cultures, anaerobic culture was positive for bacteroids- therefore continue Unasyn. Please note she was discharged on ciprofloxacin and doxycycline, suspect we can discharge her on Augmentin instead.   Urinary tract infection  Continue Unasyn  Follow urine culture  Patient has a Foley in place   Anemia  Hemoglobin down to 7.1  We'll transfuse second unit of packed red blood cells today  . Diabetes mellitus with hyperglycemia  - Continue Lantus, place on SSI. Follow CBGs.   . Cerebral embolism with cerebral infarction  - Hold aspirin for today, if no further bleeding, resume tomorrow. Currently without any focal deficits.   Marland Kitchen AKI (acute kidney injury)  - Labs from [facility] indicate acute elevation of creatinine, she was on diuretic therapy as an outpatient. We'll hold all diuretics, place on amlodipine and gently hydrate. Creatinine improving slowly  . Hypertriglyceridemia  - Continue gemfibrozil. Repeat lipid panel in the next 3-6 months.   . Diarrhea  - Suspect this to be a chronic issue-she still has a flexiseal in place- will recheck C. difficile PCR negative. Continue probiotics. Please note, patient apparently was discharged with a fexiseal and a Foley catheter a few days back.   DVT Prophylaxis:  SCD's  Code Status:  Full Code  Code Status: full  Family  Communication: family updated about patient's clinical progress  Disposition Plan: Anticipate discharge Monday   Brief narrative:  [name] is a 38 y.o. female with a Past Medical History of recent left eye abscess status post incision and drainage, recent sepsis secondary to left thigh abscess, embolic CVA, diabetes, hypertriglyceridemia who presents today with the above noted complaint. Patient was just discharged from this facility a few days back and transferred to a skilled nursing facility for further care. For the past 2 days, patient has noted bleeding from her left thigh wound. She was taken to [facility], and transferred to [facility] for further evaluation and treatment. In the emergency room, she was evaluated by Dr. Janee Morn from [facility] [place] [facility] controlled this meeting with a hand-held cautery and silver nitrate. Since the patient was just discharged from the hospitalist service, I was asked to admit this patient for further observation.  Please note, patient has a Foley catheter and a flexiseal in place, per her these have been in place since her last admission.  Patient denies any fever, chest pain, shortness of breath, nausea, vomiting. Review of last discharge summary, indicates diarrhea as an issue for which she has a flexiseal, however C Diff PCR was negative.  Please note, no labs hematocrit noted for today, patient had labs at [facility] which showed a creatinine of 1.79, WBC of 6.5, hemoglobin of 8.6 and a platelet count of 433,000. Rest of her labs were generally within normal limits.   Consultants:  Surgery Procedures:  Antibiotics:  Unasyn  HPI/Subjective:  Does not report any pain,  Slow to respond,  sleepy  Objective: Filed Vitals:   09/17/13 2040 09/17/13 2110 09/18/13 0550 09/18/13 0923  BP: 138/76  139/70   Pulse: 99 89 92   Temp: 98.6 F (37 C)  98.1 F (36.7 C)   TempSrc: Oral  Oral   Resp:  18 20 20    Height:      Weight:      SpO2: 97% 97% 99% 99%    Intake/Output Summary (Last 24 hours) at 09/18/13 1015 Last data filed at 09/18/13 0551  Gross per 24 hour  Intake   1760 ml  Output   1300 ml  Net    460 ml    Exam:  General: alert & oriented x 3 In NAD  Cardiovascular: RRR, nl S1 s2  Respiratory: Decreased breath sounds at the bases, scattered rhonchi, no crackles  Abdomen: soft +BS NT/ND, no masses palpable  Extremities: No cyanosis and no edema      Data Reviewed: Basic Metabolic Panel:  Recent Labs Lab 09/16/13 1849 09/17/13 0550 09/18/13 0506  NA 137 138 139  K 5.4* 5.3 4.5  CL 105 106 106  CO2 20 20 22   GLUCOSE 127* 132* 115*  BUN 17 20 18   CREATININE 1.59* 1.64* 1.43*  CALCIUM 8.3* 8.4 8.3*    Liver Function Tests:  Recent Labs Lab 09/18/13 0506  AST 10  ALT 8  ALKPHOS 53  BILITOT <0.2*  PROT 6.1  ALBUMIN 1.5*   No results found for this basename: LIPASE, AMYLASE,  in the last 168 hours No results found for this basename: AMMONIA,  in the last 168 hours  CBC:  Recent Labs Lab 09/16/13 1849 09/17/13 0550 09/18/13 0506  WBC 6.5 6.9 6.5  NEUTROABS 3.5  --   --   HGB 8.1* 7.1* 7.1*  HCT 25.8* 22.2* 22.0*  MCV 84.3 83.1 85.3  PLT 457* 396 371    Cardiac Enzymes: No results found for this basename: CKTOTAL, CKMB, CKMBINDEX, TROPONINI,  in the last 168 hours BNP (last 3 results) No results found for this basename: PROBNP,  in the last 8760 hours   CBG:  Recent Labs Lab 09/17/13 0635 09/17/13 1120 09/17/13 1629 09/17/13 2126 09/18/13 0628  GLUCAP 139* 139* 159* 135* 110*    Recent Results (from the past 240 hour(s))  CLOSTRIDIUM DIFFICILE BY PCR     Status: None   Collection Time    09/08/13 11:25 AM      Result Value Ref Range Status   C difficile by pcr NEGATIVE  NEGATIVE Final  CLOSTRIDIUM DIFFICILE BY PCR     Status: None   Collection Time    09/17/13  2:46 AM      Result Value Ref Range Status   C  difficile by pcr NEGATIVE  NEGATIVE Final     Studies: Ct Angio Head W/cm &/or Wo Cm  09/05/2013   CLINICAL DATA:  Diabetes, hypertension, and prior stroke with left lower extremity abscess and sepsis. Multiple, likely embolic infarcts on MRI. Stroke workup.  EXAM: CT ANGIOGRAPHY HEAD AND NECK  TECHNIQUE: Multidetector CT imaging of the head and neck was performed using the standard protocol during bolus administration of intravenous contrast. Multiplanar CT image reconstructions and MIPs were obtained to evaluate the vascular anatomy. Carotid stenosis measurements (when applicable) are obtained utilizing NASCET criteria, using the distal internal carotid diameter as the denominator.  CONTRAST:  50mL OMNIPAQUE IOHEXOL 350 MG/ML SOLN  COMPARISON:  Brain MRI 09/03/2013  FINDINGS: CTA HEAD FINDINGS  Small  foci of hypoattenuation are seen in the anterior body and genu of the corpus callosum, right globus pallidus, and left posterior left temporal periventricular white matter corresponding to acute infarcts described on recent MRI additional, punctate acute infarcts on the MRI are not well demonstrated on the CT. Remote infarcts are noted in both cerebellar hemispheres and in the left basal ganglia/ corona radiata. There is no evidence of acute large territory cortical infarct, intracranial hemorrhage, midline shift, or extra-axial fluid collection. There is mild ex vacuo dilatation of the body of the left lateral ventricle. No abnormal enhancement is identified. Orbits are unremarkable. Mastoid air cells and paranasal sinuses are clear.  The intracranial right vertebral artery is patent, dominant, and supplies the basilar. The left intracranial vertebral artery is markedly small in caliber and only intermittently patent. PICA origins appear patent. AICA and SCA origins are patent. Basilar artery is mildly irregular but without stenosis. There is a focal, mild-to-moderate distal right P1 stenosis. There is  irregular, mild to moderate attenuation of left P2 and P3 branches. Posterior communicating arteries are not clearly identified.  The proximal right intracranial ICA is diffusely small in caliber with a severe stenosis at the petrous-cavernous junction. The right ICA becomes occluded at the anterior cavernous segment. There is minimal to no reconstitution of the right intracranial ICA distal to this. Multiple collateral vessels are seen in the expected region of the right ICA terminus. These collateral vessels reconstitute the right MCA near the MCA bifurcation. Major right M2 branches appear patent although mildly smaller in caliber than those on the left. A small amount of contrast is present in the right A1 segment, with the A2 being supplied predominantly by the left ACA via the anterior communicating artery, which appears somewhat bulbous.  The left internal carotid artery is patent from skullbase to carotid terminus. There is mild calcification and moderate irregularity of the left carotid siphon. There is mild narrowing of the left ICA at the petrous-cavernous junction. There is diffuse irregularity with mild to moderate tandem stenoses involving the anterior genu of the left cavernous carotid as well as the supraclinoid left ICA. There is mild stenosis of the proximal left A1 segment. There is also mild proximal left M1 stenosis. There is mild, diffuse irregularity of left M2 and more distal branches.  Review of the MIP images confirms the above findings.  CTA NECK FINDINGS  Three vessel aortic arch. Subclavian arteries are grossly unremarkable. Evaluation of the proximal common carotid arteries is limited by image noise due to shoulder attenuation as well as mild motion artifact. No mid or distal common carotid artery stenosis is seen. There is moderate focal narrowing of the right internal carotid artery at its origin. The right ICA obtains a more normal caliber for approximately 1 cm beyond the stenosis  before becoming narrowed again and demonstrating a diffusely narrowed but smooth luminal contour for the remainder of its course in the neck. The right ICA lumen near its origin at the site of proximal stenosis measures approximately 2.3 mm in diameter, with the diffusely narrowed mid to distal right cervical ICA measuring approximately 2.7 mm in diameter. The left cervical ICA is mildly tortuous but patent and without stenosis.  The proximal right vertebral artery is grossly patent but suboptimally evaluated due to artifact. The V2 and V3 segments of the right vertebral artery are normal in appearance. The left vertebral artery appears occluded at its origin with reconstitution at the C4 level, with the lumen of the vertebral  artery being markedly small in caliber distal to this.  A left subclavian approach central venous catheter is partially visualized. Jugulodigastric lymph nodes measure up to 10 mm in short axis.  Review of the MIP images confirms the above findings.  IMPRESSION: 1. Occlusion of the intracranial right ICA. Multiple small collateral vessels in the region of the right ICA terminus is indicative of chronic occlusion. Right MCA is reconstituted new the MCA bifurcation, with slightly diminished distal flow compared to the left. Right ACA is mainly supplied via the anterior communicating artery. Right ICA is diffusely small in the neck, with moderate stenosis at its origin. 2. Moderate left carotid siphon atherosclerosis. Mild to moderate proximal right PCA stenosis. Mild to moderate attenuation of distal left PCA branch vessels. 3. 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 4. Dominant right vertebral artery without stenosis identified. 5. Small, acute bilateral infarcts as described on recent MRI.   Electronically Signed   By: Sebastian Ache   On: 09/05/2013 20:48   Ct Angio Neck W/cm &/or Wo/cm  09/05/2013   CLINICAL DATA:  Diabetes,  hypertension, and prior stroke with left lower extremity abscess and sepsis. Multiple, likely embolic infarcts on MRI. Stroke workup.  EXAM: CT ANGIOGRAPHY HEAD AND NECK  TECHNIQUE: Multidetector CT imaging of the head and neck was performed using the standard protocol during bolus administration of intravenous contrast. Multiplanar CT image reconstructions and MIPs were obtained to evaluate the vascular anatomy. Carotid stenosis measurements (when applicable) are obtained utilizing NASCET criteria, using the distal internal carotid diameter as the denominator.  CONTRAST:  50mL OMNIPAQUE IOHEXOL 350 MG/ML SOLN  COMPARISON:  Brain MRI 09/03/2013  FINDINGS: CTA HEAD FINDINGS  Small foci of hypoattenuation are seen in the anterior body and genu of the corpus callosum, right globus pallidus, and left posterior left temporal periventricular white matter corresponding to acute infarcts described on recent MRI additional, punctate acute infarcts on the MRI are not well demonstrated on the CT. Remote infarcts are noted in both cerebellar hemispheres and in the left basal ganglia/ corona radiata. There is no evidence of acute large territory cortical infarct, intracranial hemorrhage, midline shift, or extra-axial fluid collection. There is mild ex vacuo dilatation of the body of the left lateral ventricle. No abnormal enhancement is identified. Orbits are unremarkable. Mastoid air cells and paranasal sinuses are clear.  The intracranial right vertebral artery is patent, dominant, and supplies the basilar. The left intracranial vertebral artery is markedly small in caliber and only intermittently patent. PICA origins appear patent. AICA and SCA origins are patent. Basilar artery is mildly irregular but without stenosis. There is a focal, mild-to-moderate distal right P1 stenosis. There is irregular, mild to moderate attenuation of left P2 and P3 branches. Posterior communicating arteries are not clearly identified.  The  proximal right intracranial ICA is diffusely small in caliber with a severe stenosis at the petrous-cavernous junction. The right ICA becomes occluded at the anterior cavernous segment. There is minimal to no reconstitution of the right intracranial ICA distal to this. Multiple collateral vessels are seen in the expected region of the right ICA terminus. These collateral vessels reconstitute the right MCA near the MCA bifurcation. Major right M2 branches appear patent although mildly smaller in caliber than those on the left. A small amount of contrast is present in the right A1 segment, with the A2 being supplied predominantly by the left ACA via the anterior communicating artery, which appears somewhat bulbous.  The left internal carotid artery is patent from skullbase to carotid terminus. There is mild calcification and moderate irregularity of the left carotid siphon. There is mild narrowing of the left ICA at the petrous-cavernous junction. There is diffuse irregularity with mild to moderate tandem stenoses involving the anterior genu of the left cavernous carotid as well as the supraclinoid left ICA. There is mild stenosis of the proximal left A1 segment. There is also mild proximal left M1 stenosis. There is mild, diffuse irregularity of left M2 and more distal branches.  Review of the MIP images confirms the above findings.  CTA NECK FINDINGS  Three vessel aortic arch. Subclavian arteries are grossly unremarkable. Evaluation of the proximal common carotid arteries is limited by image noise due to shoulder attenuation as well as mild motion artifact. No mid or distal common carotid artery stenosis is seen. There is moderate focal narrowing of the right internal carotid artery at its origin. The right ICA obtains a more normal caliber for approximately 1 cm beyond the stenosis before becoming narrowed again and demonstrating a diffusely narrowed but smooth luminal contour for the remainder of its course in the  neck. The right ICA lumen near its origin at the site of proximal stenosis measures approximately 2.3 mm in diameter, with the diffusely narrowed mid to distal right cervical ICA measuring approximately 2.7 mm in diameter. The left cervical ICA is mildly tortuous but patent and without stenosis.  The proximal right vertebral artery is grossly patent but suboptimally evaluated due to artifact. The V2 and V3 segments of the right vertebral artery are normal in appearance. The left vertebral artery appears occluded at its origin with reconstitution at the C4 level, with the lumen of the vertebral artery being markedly small in caliber distal to this.  A left subclavian approach central venous catheter is partially visualized. Jugulodigastric lymph nodes measure up to 10 mm in short axis.  Review of the MIP images confirms the above findings.  IMPRESSION: 1. Occlusion of the intracranial right ICA. Multiple small collateral vessels in the region of the right ICA terminus is indicative of chronic occlusion. Right MCA is reconstituted new the MCA bifurcation, with slightly diminished distal flow compared to the left. Right ACA is mainly supplied via the anterior communicating artery. Right ICA is diffusely small in the neck, with moderate stenosis at its origin. 2. Moderate left carotid siphon atherosclerosis. Mild to moderate proximal right PCA stenosis. Mild to moderate attenuation of distal left PCA branch vessels. 3. 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 4. Dominant right vertebral artery without stenosis identified. 5. Small, acute bilateral infarcts as described on recent MRI.   Electronically Signed   By: Sebastian Ache   On: 09/05/2013 20:48   Mr Brain Wo Contrast  09/03/2013   CLINICAL DATA:  Change in level of consciousness. Rule out stroke. Thigh abscess with sepsis.  EXAM: MRI HEAD WITHOUT CONTRAST  TECHNIQUE: Multiplanar, multiecho pulse  sequences of the brain and surrounding structures were obtained without intravenous contrast.  COMPARISON:  None.  FINDINGS: Images are mildly to moderately degraded by motion. There are numerous punctate foci of restricted diffusion involving cortex and white matter of both cerebral hemispheres consistent with acute infarcts. Several slightly larger foci of acute infarction are present in the corpus callosum measuring up to approximately 1.3 cm in size. Small, acute infarcts are also present in the right greater than left basal ganglia, and there is a punctate  acute infarct anteriorly in the right aspect of the medulla. There may be a small amount of hemorrhage associated with a few of these punctate infarcts. Old infarcts are present in the bilateral basal ganglia and bilateral cerebellum.  There is no mass, midline shift, or extra-axial fluid collection. There is mild cerebral and cerebellar atrophy. Orbits are unremarkable. Minimal ethmoid air cell mucosal thickening is noted. Mastoid air cells are clear.  IMPRESSION: 1. Multiple small infarcts involving both cerebral hemispheres, corpus callosum, and medulla, consistent with embolic infarcts. These could be bland or septic emboli given history. 2. 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   Electronically Signed   By: Sebastian Ache   On: 09/03/2013 17:38   Dg Chest Port 1 View  09/05/2013   CLINICAL DATA:  Left-sided PICC placement.  EXAM: PORTABLE CHEST - 1 VIEW  COMPARISON:  Chest x-ray 08/31/2013.  FINDINGS: There is a left upper extremity PICC with tip terminating in the superior aspect of the right atrium (approximately 3 cm beyond the superior cavoatrial junction). No pneumothorax. No acute consolidative airspace disease. No pleural effusions. No evidence of pulmonary edema. Mild cardiomegaly. Upper mediastinal contours are within normal limits.  IMPRESSION: 1. Tip of PICC is in the right atrium approximately 3 cm distal to the superior cavoatrial  junction. 2. Status post extubation with significantly improved aeration. 3. Mild cardiomegaly.   Electronically Signed   By: Trudie Reed M.D.   On: 09/05/2013 14:21   Portable Chest Xray In Am  08/31/2013   CLINICAL DATA:  Respiratory failure.  EXAM: PORTABLE CHEST - 1 VIEW  COMPARISON:  08/30/2013.  FINDINGS: The endotracheal tube is 4 cm above the carina. The NG tube is coursing down the esophagus and into the stomach. The heart is enlarged but unchanged. Persistent mild perihilar edema and probable small effusions.  IMPRESSION: Stable support apparatus.  Persistent perihilar edema and areas of atelectasis.   Electronically Signed   By: Loralie Champagne M.D.   On: 08/31/2013 07:29   Dg Chest Port 1 View  08/30/2013   CLINICAL DATA:  Endotracheal tube placement.  EXAM: PORTABLE CHEST - 1 VIEW  COMPARISON:  08/30/2013 and 02/21/2013.  FINDINGS: 2214 hr. Endotracheal tube appears unchanged, within the mid trachea. There is improved aeration of the lungs with residual patchy left greater than right basilar airspace opacities. There is no pleural effusion or pneumothorax. The heart size and mediastinal contours are stable.  IMPRESSION: Interval improved aeration of the lung bases. Endotracheal tube appears unchanged within the midtrachea.   Electronically Signed   By: Roxy Horseman M.D.   On: 08/30/2013 23:16   Portable Chest Xray  08/30/2013   CLINICAL DATA:  Endotracheal tube placement  EXAM: PORTABLE CHEST - 1 VIEW  COMPARISON:  Chest radiograph 02/21/2013  FINDINGS: Markedly low lung volumes limit evaluation. The endotracheal tube terminates within the distal trachea approximately 5 mm superior to the carina. Cardiomegaly. Heterogeneous lower lung opacities, left greater than right. No pleural effusion or pneumothorax.  IMPRESSION: Endotracheal tube terminates within the distal trachea. Recommend retraction and re-evaluation with repeat radiograph.  Scattered heterogeneous opacities favored to  represent atelectasis in the setting of markedly low lung volumes.  Discussed with Nurse Clarene Critchley at 8:43 p.m. on 08/30/2013.   Electronically Signed   By: Annia Belt M.D.   On: 08/30/2013 20:44   Dg Abd Portable 1v  08/31/2013   CLINICAL DATA:  Enteric tube placement.  EXAM: PORTABLE ABDOMEN - 1 VIEW  COMPARISON:  Lumbar spine radiographs performed 09/26/2011  FINDINGS: The patient's enteric tube is seen ending at the body of the stomach, with the sideport at the fundus of the stomach.  The visualized bowel gas pattern is grossly unremarkable, with scattered air noted in the colon. No free intra-abdominal air is identified, though evaluation for free air is limited on a single supine view and due to motion artifact. No acute osseous abnormalities are seen.  IMPRESSION: Enteric tube is seen ending at the body of the stomach, with the sideport at the fundus of the stomach.   Electronically Signed   By: Roanna Raider M.D.   On: 08/31/2013 05:45    Scheduled Meds: . sodium chloride   Intravenous Once  . sodium chloride   Intravenous Once  . amLODipine  10 mg Oral Daily  . ampicillin-sulbactam (UNASYN) IV  3 g Intravenous Q6H  . arformoterol  15 mcg Nebulization Q12H  . aspirin EC  325 mg Oral Daily  . atorvastatin  20 mg Oral q1800  . budesonide  0.25 mg Nebulization Q12H  . feeding supplement (PRO-STAT SUGAR FREE 64)  30 mL Oral BID WC  . gemfibrozil  600 mg Oral BID AC  . insulin aspart  0-15 Units Subcutaneous TID WC  . insulin glargine  22 Units Subcutaneous Daily  . multivitamin with minerals  1 tablet Oral Daily  . saccharomyces boulardii  250 mg Oral BID   Continuous Infusions: . sodium chloride 100 mL/hr at 09/17/13 1610    Principal Problem:   Bleeding from wound Active Problems:   Diabetes mellitus with hyperglycemia   AKI (acute kidney injury)   Cerebral embolism with cerebral infarction   Hypertriglyceridemia   Diarrhea    Time spent: 40  minutes   [facility]  [facility] Pager [phone]. If 8PM-8AM, please contact night-coverage at [email or web address], password [email or web address] 09/18/2013, 10:15 AM  LOS: 2 days

## 2013-09-18 NOTE — Progress Notes (Signed)
INITIAL NUTRITION ASSESSMENT  DOCUMENTATION CODES Per approved criteria  -Morbid Obesity   INTERVENTION: -Initiate 48 hour calorie count. Calorie count envelope hung on the patient's door. Please document percent consumed for each item on the patient's meal tray ticket and keep in envelope. Also document percent of any supplement or snack pt consumes and keep documentation in envelope for RD to review.  -RD will follow up with calorie count on Monday. -Continue Prostat 30 ml BID. Mix with liquids/pudding to promote intake.   NUTRITION DIAGNOSIS: Inadequate oral intake related to decreased appetite as evidenced by 0% intake of meals.   Goal: Patient will meet >/=90% of estimated nutrition needs  Monitor:  Calorie count, PO intake, weight, labs  Reason for Assessment: Consult, Calorie count  38 y.o. female  Admitting Dx: Bleeding from wound  ASSESSMENT: 38 y.o. female with a Past Medical History of recent left eye abscess status post incision and drainage, recent sepsis secondary to left thigh abscess, embolic CVA, diabetes, hypertriglyceridemia, presents with bleeding of left thigh wound for the last 2 days.   Patient is lethargic, responding minimally to questions asked. She has not eaten lunch and did not eat breakfast per RN. Spoke with MD, who wants to start a calorie count to determine if patient is getting adequate nutrition. She suspects poor intake may be related to depression.   Patient is receiving Prostat BID, but refusing due to flavor. Spoke with RN about mixing with other food items to improve flavor. Patient is morbidly obese, no signs of subcutaneous fat or muscle depletion.   Height: Ht Readings from Last 1 Encounters:  09/16/13 5\' 3"  (1.6 m)    Weight: Wt Readings from Last 1 Encounters:  09/16/13 300 lb (136.079 kg)    Ideal Body Weight: 115 pounds  % Ideal Body Weight: 260%  Wt Readings from Last 10 Encounters:  09/16/13 300 lb (136.079 kg)   09/02/13 319 lb 3.6 oz (144.8 kg)  09/02/13 319 lb 3.6 oz (144.8 kg)  09/02/13 319 lb 3.6 oz (144.8 kg)  02/21/13 296 lb 3.2 oz (134.355 kg)  02/20/13 297 lb (134.718 kg)  10/11/12 177 lb 12.8 oz (80.65 kg)  10/11/12 177 lb 12.8 oz (80.65 kg)  10/06/12 178 lb 8 oz (80.967 kg)  09/29/12 176 lb 12.8 oz (80.196 kg)    Usual Body Weight: 319 pounds  % Usual Body Weight: 94%  BMI:  Body mass index is 53.16 kg/(m^2). Morbidly obese  Estimated Nutritional Needs: Kcal: 2050-2200 kcal Protein: 150-165 g Fluid: > 2.2 L/day  Skin: Wound left leg  Diet Order:  Heart Healthy, Carb modified  EDUCATION NEEDS: -No education needs identified at this time   Intake/Output Summary (Last 24 hours) at 09/18/13 1523 Last data filed at 09/18/13 1418  Gross per 24 hour  Intake   1845 ml  Output   1300 ml  Net    545 ml    Last BM: PTA   Labs:   Recent Labs Lab 09/16/13 1849 09/17/13 0550 09/18/13 0506  NA 137 138 139  K 5.4* 5.3 4.5  CL 105 106 106  CO2 20 20 22   BUN 17 20 18   CREATININE 1.59* 1.64* 1.43*  CALCIUM 8.3* 8.4 8.3*  GLUCOSE 127* 132* 115*    CBG (last 3)   Recent Labs  09/17/13 2126 09/18/13 0628 09/18/13 1036  GLUCAP 135* 110* 106*    Scheduled Meds: . sodium chloride   Intravenous Once  . sodium chloride   Intravenous  Once  . amLODipine  10 mg Oral Daily  . ampicillin-sulbactam (UNASYN) IV  3 g Intravenous Q6H  . arformoterol  15 mcg Nebulization Q12H  . aspirin EC  325 mg Oral Daily  . atorvastatin  20 mg Oral q1800  . budesonide  0.25 mg Nebulization Q12H  . feeding supplement (PRO-STAT SUGAR FREE 64)  30 mL Oral BID WC  . gemfibrozil  600 mg Oral BID AC  . insulin aspart  0-15 Units Subcutaneous TID WC  . insulin glargine  10 Units Subcutaneous Daily  . multivitamin with minerals  1 tablet Oral Daily  . saccharomyces boulardii  250 mg Oral BID    Continuous Infusions: . sodium chloride 50 mL/hr at 09/18/13 1025    Past Medical  History  Diagnosis Date  . Morbid obesity   . Hypertension   . CVA (cerebral infarction) 1990's    "writing is not the same since"  . Asthma   . Type II diabetes mellitus   . GERD (gastroesophageal reflux disease)     Past Surgical History  Procedure Laterality Date  . Incision and drainage of wound Left 08/30/2013    thigh necrotic wound/notes 08/30/2013  . Irrigation and debridement abscess Left 08/30/2013    Procedure: IRRIGATION AND DEBRIDEMENT ABSCESS Left Thigh Necrotic Wound;  Surgeon: Axel FillerArmando Ramirez, MD;  Location: [facility] OR;  Service: General;  Laterality: Left;  . Tee without cardioversion N/A 09/07/2013    Procedure: TRANSESOPHAGEAL ECHOCARDIOGRAM (TEE);  Surgeon: Chrystie NoseKenneth C. Hilty, MD;  [facility];  Service: Cardiovascular;  Laterality: N/A;    Linnell FullingElyse Ebenezer Mccaskey, RD, LDN Pager #: [phone] After-Hours Pager #: [phone]

## 2013-09-19 LAB — BASIC METABOLIC PANEL
Anion gap: 12 (ref 5–15)
BUN: 16 mg/dL (ref 6–23)
CHLORIDE: 105 meq/L (ref 96–112)
CO2: 22 mEq/L (ref 19–32)
Calcium: 8.1 mg/dL — ABNORMAL LOW (ref 8.4–10.5)
Creatinine, Ser: 1.21 mg/dL — ABNORMAL HIGH (ref 0.50–1.10)
GFR calc Af Amer: 66 mL/min — ABNORMAL LOW (ref >90)
GFR calc non Af Amer: 57 mL/min — ABNORMAL LOW (ref >90)
GLUCOSE: 87 mg/dL (ref 70–99)
POTASSIUM: 4.4 meq/L (ref 3.7–5.3)
Sodium: 139 mEq/L (ref 137–147)

## 2013-09-19 LAB — CBC
HEMATOCRIT: 23.1 % — AB (ref 36.0–46.0)
HEMOGLOBIN: 7.4 g/dL — AB (ref 12.0–15.0)
MCH: 26.4 pg (ref 26.0–34.0)
MCHC: 32 g/dL (ref 30.0–36.0)
MCV: 82.5 fL (ref 78.0–100.0)
Platelets: 368 10*3/uL (ref 150–400)
RBC: 2.8 MIL/uL — AB (ref 3.87–5.11)
RDW: 16.1 % — ABNORMAL HIGH (ref 11.5–15.5)
WBC: 7.1 10*3/uL (ref 4.0–10.5)

## 2013-09-19 LAB — GLUCOSE, CAPILLARY
GLUCOSE-CAPILLARY: 127 mg/dL — AB (ref 70–99)
GLUCOSE-CAPILLARY: 177 mg/dL — AB (ref 70–99)
Glucose-Capillary: 80 mg/dL (ref 70–99)
Glucose-Capillary: 189 mg/dL — ABNORMAL HIGH (ref 70–99)

## 2013-09-19 LAB — FERRITIN: Ferritin: 595 ng/mL — ABNORMAL HIGH (ref 10–291)

## 2013-09-19 LAB — IRON AND TIBC
Iron: 19 ug/dL — ABNORMAL LOW (ref 42–135)
Saturation Ratios: 12 % — ABNORMAL LOW (ref 20–55)
TIBC: 154 ug/dL — ABNORMAL LOW (ref 250–470)
UIBC: 135 ug/dL (ref 125–400)

## 2013-09-19 LAB — RETICULOCYTES
RBC.: 3.21 MIL/uL — AB (ref 3.87–5.11)
RETIC CT PCT: 2 % (ref 0.4–3.1)
Retic Count, Absolute: 64.2 10*3/uL (ref 19.0–186.0)

## 2013-09-19 LAB — LACTATE DEHYDROGENASE: LDH: 272 U/L — ABNORMAL HIGH (ref 94–250)

## 2013-09-19 LAB — FOLATE: Folate: 9.4 ng/mL

## 2013-09-19 LAB — VITAMIN B12: VITAMIN B 12: 498 pg/mL (ref 211–911)

## 2013-09-19 LAB — PREPARE RBC (CROSSMATCH)

## 2013-09-19 MED ORDER — SODIUM CHLORIDE 0.9 % IV SOLN: Freq: Once | INTRAVENOUS | Status: DC

## 2013-09-19 NOTE — Progress Notes (Signed)
Patient ID: [name], female   DOB: 1975/09/12, 38 y.o.   MRN: [id number]     [facility]      [address] [place], [place] [address]    Phone: [phone] FAX: [phone]     Subjective: No wound issues.  Objective:  Vital signs:  Filed Vitals:   09/18/13 1418 09/18/13 2117 09/18/13 2213 09/19/13 0630  BP: 157/76  140/79 136/74  Pulse: 96  95 85  Temp: 98.5 F (36.9 C)  98.6 F (37 C) 98.2 F (36.8 C)  TempSrc: Oral  Oral Oral  Resp: $Remo'16  16 16  'KcFkV$ Height:      Weight:      SpO2: 98% 98% 97% 100%    Last BM Date: 09/18/13  Intake/Output   Yesterday:  08/08 0701 - 08/09 0700 In: 635 [Blood:335; IV Piggyback:300] Out: 950 [Urine:950] This shift:     Physical Exam: Skin: left thigh wound, beefy red, very minimal fibrinous exudate, no active bleeding.     Problem List:   Principal Problem:   Bleeding from wound Active Problems:   Diabetes mellitus with hyperglycemia   AKI (acute kidney injury)   Cerebral embolism with cerebral infarction   Hypertriglyceridemia   Diarrhea    Results:   Labs: Results for orders placed during the hospital encounter of 09/16/13 (from the past 48 hour(s))  GLUCOSE, CAPILLARY     Status: Abnormal   Collection Time    09/17/13 11:20 AM      Result Value Ref Range   Glucose-Capillary 139 (*) 70 - 99 mg/dL   Comment 1 Notify RN    GLUCOSE, CAPILLARY     Status: Abnormal   Collection Time    09/17/13  4:29 PM      Result Value Ref Range   Glucose-Capillary 159 (*) 70 - 99 mg/dL  PREPARE RBC (CROSSMATCH)     Status: None   Collection Time    09/17/13  6:55 PM      Result Value Ref Range   Order Confirmation ORDER PROCESSED BY BLOOD BANK    GLUCOSE, CAPILLARY     Status: Abnormal   Collection Time    09/17/13  9:26 PM      Result Value Ref Range   Glucose-Capillary 135 (*) 70 - 99 mg/dL  CBC     Status: Abnormal   Collection Time    09/18/13  5:06 AM      Result  Value Ref Range   WBC 6.5  4.0 - 10.5 K/uL   RBC 2.58 (*) 3.87 - 5.11 MIL/uL   Hemoglobin 7.1 (*) 12.0 - 15.0 g/dL   HCT 22.0 (*) 36.0 - 46.0 %   MCV 85.3  78.0 - 100.0 fL   MCH 27.5  26.0 - 34.0 pg   MCHC 32.3  30.0 - 36.0 g/dL   RDW 15.0  11.5 - 15.5 %   Platelets 371  150 - 400 K/uL  COMPREHENSIVE METABOLIC PANEL     Status: Abnormal   Collection Time    09/18/13  5:06 AM      Result Value Ref Range   Sodium 139  137 - 147 mEq/L   Potassium 4.5  3.7 - 5.3 mEq/L   Chloride 106  96 - 112 mEq/L   CO2 22  19 - 32 mEq/L   Glucose, Bld 115 (*) 70 - 99 mg/dL   BUN 18  6 - 23 mg/dL   Creatinine,  Ser 1.43 (*) 0.50 - 1.10 mg/dL   Calcium 8.3 (*) 8.4 - 10.5 mg/dL   Total Protein 6.1  6.0 - 8.3 g/dL   Albumin 1.5 (*) 3.5 - 5.2 g/dL   AST 10  0 - 37 U/L   ALT 8  0 - 35 U/L   Alkaline Phosphatase 53  39 - 117 U/L   Total Bilirubin <0.2 (*) 0.3 - 1.2 mg/dL   GFR calc non Af Amer 46 (*) >90 mL/min   GFR calc Af Amer 54 (*) >90 mL/min   Comment: (NOTE)     The eGFR has been calculated using the CKD EPI equation.     This calculation has not been validated in all clinical situations.     eGFR's persistently <90 mL/min signify possible Chronic Kidney     Disease.   Anion gap 11  5 - 15  GLUCOSE, CAPILLARY     Status: Abnormal   Collection Time    09/18/13  6:28 AM      Result Value Ref Range   Glucose-Capillary 110 (*) 70 - 99 mg/dL  PREPARE RBC (CROSSMATCH)     Status: None   Collection Time    09/18/13  8:30 AM      Result Value Ref Range   Order Confirmation ORDER PROCESSED BY BLOOD BANK    GLUCOSE, CAPILLARY     Status: Abnormal   Collection Time    09/18/13 10:36 AM      Result Value Ref Range   Glucose-Capillary 106 (*) 70 - 99 mg/dL  GLUCOSE, CAPILLARY     Status: None   Collection Time    09/18/13  4:56 PM      Result Value Ref Range   Glucose-Capillary 98  70 - 99 mg/dL  GLUCOSE, CAPILLARY     Status: None   Collection Time    09/18/13 10:15 PM      Result Value  Ref Range   Glucose-Capillary 96  70 - 99 mg/dL  BASIC METABOLIC PANEL     Status: Abnormal   Collection Time    09/19/13  5:05 AM      Result Value Ref Range   Sodium 139  137 - 147 mEq/L   Potassium 4.4  3.7 - 5.3 mEq/L   Chloride 105  96 - 112 mEq/L   CO2 22  19 - 32 mEq/L   Glucose, Bld 87  70 - 99 mg/dL   BUN 16  6 - 23 mg/dL   Creatinine, Ser 1.21 (*) 0.50 - 1.10 mg/dL   Calcium 8.1 (*) 8.4 - 10.5 mg/dL   GFR calc non Af Amer 57 (*) >90 mL/min   GFR calc Af Amer 66 (*) >90 mL/min   Comment: (NOTE)     The eGFR has been calculated using the CKD EPI equation.     This calculation has not been validated in all clinical situations.     eGFR's persistently <90 mL/min signify possible Chronic Kidney     Disease.   Anion gap 12  5 - 15  CBC     Status: Abnormal   Collection Time    09/19/13  5:05 AM      Result Value Ref Range   WBC 7.1  4.0 - 10.5 K/uL   RBC 2.80 (*) 3.87 - 5.11 MIL/uL   Hemoglobin 7.4 (*) 12.0 - 15.0 g/dL   HCT 23.1 (*) 36.0 - 46.0 %   MCV 82.5  78.0 - 100.0 fL  MCH 26.4  26.0 - 34.0 pg   MCHC 32.0  30.0 - 36.0 g/dL   RDW 16.1 (*) 11.5 - 15.5 %   Platelets 368  150 - 400 K/uL  LACTATE DEHYDROGENASE     Status: Abnormal   Collection Time    09/19/13  5:05 AM      Result Value Ref Range   LDH 272 (*) 94 - 250 U/L   Comment: HEMOLYSIS AT THIS LEVEL MAY AFFECT RESULT  GLUCOSE, CAPILLARY     Status: None   Collection Time    09/19/13  6:46 AM      Result Value Ref Range   Glucose-Capillary 80  70 - 99 mg/dL    Imaging / Studies: No results found.  Medications / Allergies:  Scheduled Meds: . sodium chloride   Intravenous Once  . sodium chloride   Intravenous Once  . amLODipine  10 mg Oral Daily  . ampicillin-sulbactam (UNASYN) IV  3 g Intravenous Q6H  . arformoterol  15 mcg Nebulization Q12H  . aspirin EC  325 mg Oral Daily  . atorvastatin  20 mg Oral q1800  . budesonide  0.25 mg Nebulization Q12H  . feeding supplement (PRO-STAT SUGAR FREE 64)   30 mL Oral BID WC  . gemfibrozil  600 mg Oral BID AC  . insulin aspart  0-15 Units Subcutaneous TID WC  . insulin glargine  10 Units Subcutaneous Daily  . multivitamin with minerals  1 tablet Oral Daily  . saccharomyces boulardii  250 mg Oral BID   Continuous Infusions: . sodium chloride 50 mL/hr at 09/19/13 0239   PRN Meds:.acetaminophen, acetaminophen, albuterol, alum & mag hydroxide-simeth, guaiFENesin-dextromethorphan, morphine injection, ondansetron (ZOFRAN) IV, ondansetron, oxyCODONE  Antibiotics: Anti-infectives   Start     Dose/Rate Route Frequency Ordered Stop   09/16/13 2000  Ampicillin-Sulbactam (UNASYN) 3 g in sodium chloride 0.9 % 100 mL IVPB     3 g 100 mL/hr over 60 Minutes Intravenous Every 6 hours 09/16/13 1951        Assessment/Plan  1. S/p debridement of necrotizing soft tissue infection of left thigh  2. Bleeding from wound, ceased  3. Anemia  Wound is not actively and clean.  Continue with BID wet to dry dressing changes Will follow PRN Further management per primary team  Erby Pian, [facility] Pager [phone] Office [phone]  09/19/2013 9:48 AM

## 2013-09-19 NOTE — Progress Notes (Signed)
Blood loss is not from her wound.  Linda LamasJames O. Gae BonWyatt, III, MD, FACS [phone]--pager [phone]--office [facility]

## 2013-09-19 NOTE — Progress Notes (Signed)
ANTIBIOTIC CONSULT NOTE - F/u  Pharmacy Consult for unasyn Indication: wound infxn  Allergies  Allergen Reactions  . Azithromycin Other (See Comments)    Nose bleeding event    Patient Measurements: Height: 5\' 3"  (160 cm) Weight: 300 lb (136.079 kg) IBW/kg (Calculated) : 52.4 Adjusted Body Weight:   Vital Signs: Temp: 98.2 F (36.8 C) (08/09 0630) Temp src: Oral (08/09 0630) BP: 136/74 mmHg (08/09 0630) Pulse Rate: 85 (08/09 0630) Intake/Output from previous day: 08/08 0701 - 08/09 0700 In: 635 [Blood:335; IV Piggyback:300] Out: 950 [Urine:950] Intake/Output from this shift:    Labs:  Recent Labs  09/17/13 0550 09/18/13 0506 09/19/13 0505  WBC 6.9 6.5 7.1  HGB 7.1* 7.1* 7.4*  PLT 396 371 368  CREATININE 1.64* 1.43* 1.21*   Estimated Creatinine Clearance: 87.2 ml/min (by C-G formula based on Cr of 1.21). No results found for this basename: VANCOTROUGH, VANCOPEAK, VANCORANDOM, GENTTROUGH, GENTPEAK, GENTRANDOM, TOBRATROUGH, TOBRAPEAK, TOBRARND, AMIKACINPEAK, AMIKACINTROU, AMIKACIN,  in the last 72 hours   Microbiology: Recent Results (from the past 720 hour(s))  CULTURE, BLOOD (ROUTINE X 2)     Status: None   Collection Time    08/30/13  2:29 PM      Result Value Ref Range Status   Specimen Description BLOOD LEFT ANTECUBITAL   Final   Special Requests     Final   Value: BOTTLES DRAWN AEROBIC AND ANAEROBIC 6CC AER 5CC ANA   Culture  Setup Time     Final   Value: 08/30/2013 19:36     Performed at [facility]   Culture     Final   Value: NO GROWTH 5 DAYS     Performed at [facility]   Report Status 09/05/2013 FINAL   Final  CULTURE, BLOOD (ROUTINE X 2)     Status: None   Collection Time    08/30/13  2:41 PM      Result Value Ref Range Status   Specimen Description BLOOD LEFT HAND   Final   Special Requests BOTTLES DRAWN AEROBIC AND ANAEROBIC [facility]6CC   Final   Culture  Setup Time     Final   Value: 08/30/2013 19:36     Performed at [facility]   Culture     Final   Value: STAPHYLOCOCCUS SPECIES (COAGULASE NEGATIVE)     Note: THE SIGNIFICANCE OF ISOLATING THIS ORGANISM FROM A SINGLE SET OF BLOOD CULTURES WHEN MULTIPLE SETS ARE DRAWN IS UNCERTAIN. PLEASE NOTIFY THE MICROBIOLOGY DEPARTMENT WITHIN ONE WEEK IF SPECIATION AND SENSITIVITIES ARE REQUIRED.     Note: Gram Stain Report Called to,Read Back By and Verified With: SARA G@12 :54PM ON 08/31/13 BY DANTS     Performed at [facility]   Report Status 09/01/2013 FINAL   Final  WOUND CULTURE     Status: None   Collection Time    08/30/13  5:06 PM      Result Value Ref Range Status   Specimen Description WOUND THIGH LEFT   Final   Special Requests PATIENT ON FOLLOWING VANCOMYCIN, ZOSYN   Final   Gram Stain     Final   Value: RARE WBC PRESENT, PREDOMINANTLY MONONUCLEAR     NO SQUAMOUS EPITHELIAL CELLS SEEN     ABUNDANT GRAM NEGATIVE RODS     FEW GRAM POSITIVE COCCI IN PAIRS     IN CLUSTERS FEW GRAM POSITIVE RODS   Culture     Final   Value: NO GROWTH 2 DAYS  Performed at [facility]   Report Status 09/02/2013 FINAL   Final  ANAEROBIC CULTURE     Status: None   Collection Time    08/30/13  5:06 PM      Result Value Ref Range Status   Specimen Description WOUND THIGH LEFT   Final   Special Requests PATIENT ON FOLLOWING VANCOMYCIN, ZOSYN   Final   Gram Stain     Final   Value: RARE WBC PRESENT, PREDOMINANTLY MONONUCLEAR     NO SQUAMOUS EPITHELIAL CELLS SEEN     ABUNDANT GRAM NEGATIVE RODS     FEW GRAM POSITIVE COCCI IN PAIRS     IN CLUSTERS FEW GRAM POSITIVE RODS   Culture     Final   Value: ABUNDANT BACTEROIDES UNIFORMIS     Note: BETA LACTAMASE POSITIVE     Performed at [facility]   Report Status 09/06/2013 FINAL   Final  MRSA PCR SCREENING     Status: None   Collection Time    08/30/13  7:11 PM      Result Value Ref Range Status   MRSA by PCR NEGATIVE  NEGATIVE Final   Comment:            The GeneXpert MRSA Assay (FDA      approved for NASAL specimens     only), is one component of a     comprehensive MRSA colonization     surveillance program. It is not     intended to diagnose MRSA     infection nor to guide or     monitor treatment for     MRSA infections.  URINE CULTURE     Status: None   Collection Time    08/30/13  7:12 PM      Result Value Ref Range Status   Specimen Description URINE, CATHETERIZED   Final   Special Requests NONE   Final   Culture  Setup Time     Final   Value: 08/31/2013 00:49     Performed at [facility] Count     Final   Value: NO GROWTH     Performed at [facility]   Culture     Final   Value: NO GROWTH     Performed at [facility]   Report Status 09/01/2013 FINAL   Final  CULTURE, BLOOD (ROUTINE X 2)     Status: None   Collection Time    09/01/13 11:10 AM      Result Value Ref Range Status   Specimen Description BLOOD LEFT ANTECUBITAL   Final   Special Requests BOTTLES DRAWN AEROBIC ONLY [facility]   Final   Culture  Setup Time     Final   Value: 09/01/2013 14:01     Performed at [facility]   Culture     Final   Value: NO GROWTH 5 DAYS     Performed at [facility]   Report Status 09/07/2013 FINAL   Final  CULTURE, BLOOD (ROUTINE X 2)     Status: None   Collection Time    09/01/13 11:20 AM      Result Value Ref Range Status   Specimen Description BLOOD LEFT HAND   Final   Special Requests BOTTLES DRAWN AEROBIC ONLY [facility]   Final   Culture  Setup Time     Final   Value: 09/01/2013 14:01     Performed at [facility]   Culture     Final   Value: NO GROWTH 5 DAYS     Performed at [facility]   Report Status 09/07/2013 FINAL   Final  CULTURE, BLOOD (ROUTINE X 2)     Status: None   Collection Time    09/06/13  2:20 PM      Result Value Ref Range Status   Specimen Description BLOOD RIGHT THUMB   Final   Special Requests BOTTLES DRAWN AEROBIC AND ANAEROBIC 10CC   Final   Culture  Setup Time      Final   Value: 09/06/2013 19:16     Performed at [facility]   Culture     Final   Value: NO GROWTH 5 DAYS     Performed at [facility]   Report Status 09/12/2013 FINAL   Final  CULTURE, BLOOD (ROUTINE X 2)     Status: None   Collection Time    09/06/13  2:35 PM      Result Value Ref Range Status   Specimen Description BLOOD RIGHT HAND   Final   Special Requests BOTTLES DRAWN AEROBIC AND ANAEROBIC [facility]   Final   Culture  Setup Time     Final   Value: 09/06/2013 19:16     Performed at [facility]   Culture     Final   Value: NO GROWTH 5 DAYS     Performed at [facility]   Report Status 09/12/2013 FINAL   Final  CLOSTRIDIUM DIFFICILE BY PCR     Status: None   Collection Time    09/08/13 11:25 AM      Result Value Ref Range Status   C difficile by pcr NEGATIVE  NEGATIVE Final  CLOSTRIDIUM DIFFICILE BY PCR     Status: None   Collection Time    09/17/13  2:46 AM      Result Value Ref Range Status   C difficile by pcr NEGATIVE  NEGATIVE Final  URINE CULTURE     Status: None   Collection Time    09/17/13  2:48 AM      Result Value Ref Range Status   Specimen Description URINE, CATHETERIZED   Final   Special Requests NONE   Final   Culture  Setup Time     Final   Value: 09/17/2013 09:30     Performed at [facility] Count     Final   Value: 45,000 COLONIES/ML     Performed at [facility]   Culture     Final   Value: Multiple bacterial morphotypes present, none predominant. Suggest appropriate recollection if clinically indicated.     Performed at [facility]   Report Status 09/18/2013 FINAL   Final    Medical History: Past Medical History  Diagnosis Date  . Morbid obesity   . Hypertension   . CVA (cerebral infarction) 1990's    "writing is not the same since"  . Asthma   . Type II diabetes mellitus   . GERD (gastroesophageal reflux disease)     Medications:  Anti-infectives   Start      Dose/Rate Route Frequency Ordered Stop   09/16/13 2000  Ampicillin-Sulbactam (UNASYN) 3 g in sodium chloride 0.9 % 100 mL IVPB     3 g 100 mL/hr over 60 Minutes Intravenous Every 6 hours 09/16/13 1951       Assessment: 37 yof recently discharged to  SNF from the hospital (s/p cipro and doxy). Pt is currently continuing on unasyn D#4 for wound infection. Afeb, WBC wnl. SCr improving to 1.21.   Unasyn 8/6>>  UC 8/7>>mult  Goal of Therapy:  Eradication of infection  Plan:  1. Unasyn 3gm IV Q6H 2. F/u renal fxn, C&S, clinical status, abx dot  Babs Bertin, PharmD Clinical Pharmacist - Resident Pager [phone] 09/19/2013 11:27 AM

## 2013-09-19 NOTE — Progress Notes (Signed)
[facility] PROGRESS NOTE  [name] ZOX:[id number] DOB: 01-13-1976 DOA: 09/16/2013 PCP: Lonia Blood, MD  Assessment/Plan: Principal Problem:   Bleeding from wound Active Problems:   Diabetes mellitus with hyperglycemia   AKI (acute kidney injury)   Cerebral embolism with cerebral infarction   Hypertriglyceridemia   Diarrhea    Bleeding from wound  General surgery used hand-held cautery and silver nitrate to control bleeding. Dressing is in place, it is currently dry and no soakage is seen.  Hemoglobin still low but no active bleeding as per surgery Culture no growth so far  Prior admission cultures, anaerobic culture was positive for bacteroids- therefore continue Unasyn. Please note she was discharged on ciprofloxacin and doxycycline, suspect we can discharge her on Augmentin tomorrow  Urinary tract infection  Continue Unasyn  Follow urine culture  Patient has a Foley in place   Severe protein calorie malnutrition Nutrition consultation obtained yesterday were poor oral intake and calorie counting I suspect that the patient depressed and not motivated to get better  Anemia  Hemoglobin hasn't really improved after 2 units of packed red blood cells, could be related to menstrual bleeding/anemia of chronic disease We'll check an anemia panel today We'll transfuse 3 rd unit of packed red blood cells today   . Diabetes mellitus with hyperglycemia   Lantus adjusted yesterday, place on SSI. Follow CBGs.   . Cerebral embolism with cerebral infarction  - Hold aspirin for today, if no further bleeding, resume tomorrow. Currently without any focal deficits.   Marland Kitchen AKI (acute kidney injury)  - Labs from [facility] indicate acute elevation of creatinine, she was on diuretic therapy as an outpatient. We'll hold all diuretics, IV fluids decreased 8/8., Creatinine improving slowly   . Hypertriglyceridemia  - Continue gemfibrozil. Repeat lipid panel in the next 3-6  months.   . Diarrhea  - Suspect this to be a chronic issue-she still has a flexiseal in place which we will remove today C. difficile PCR negative. Continue probiotics.  Please note, patient apparently was discharged with a fexiseal and a Foley catheter a few days back. Next   DVT Prophylaxis:  SCD's    Code Status: full  Family Communication: family updated about patient's clinical progress  Disposition Plan: Anticipate discharge Monday   Brief narrative:  [name] is a 38 y.o. female with a Past Medical History of recent left eye abscess status post incision and drainage, recent sepsis secondary to left thigh abscess, embolic CVA, diabetes, hypertriglyceridemia who presents today with the above noted complaint. Patient was just discharged from this facility a few days back and transferred to a skilled nursing facility for further care. For the past 2 days, patient has noted bleeding from her left thigh wound. She was taken to [facility], and transferred to [facility] for further evaluation and treatment. In the emergency room, she was evaluated by Dr. Janee Morn from [facility] [place] [facility] controlled this meeting with a hand-held cautery and silver nitrate. Since the patient was just discharged from the hospitalist service, I was asked to admit this patient for further observation.  Please note, patient has a Foley catheter and a flexiseal in place, per her these have been in place since her last admission.  Patient denies any fever, chest pain, shortness of breath, nausea, vomiting. Review of last discharge summary, indicates diarrhea as an issue for which she has a flexiseal, however C Diff PCR was negative.  Please note, no labs hematocrit noted for today,  patient had labs at [facility] which showed a creatinine of 1.79, WBC of 6.5, hemoglobin of 8.6 and a platelet count of 433,000. Rest of her labs were generally within normal  limits.  Consultants:  Surgery Procedures:  Antibiotics:  Unasyn    HPI/Subjective:  Does not report any pain,  Patient more awake today, states that the patient has ongoing menstrual bleeding)  Objective: Filed Vitals:   09/18/13 1418 09/18/13 2117 09/18/13 2213 09/19/13 0630  BP: 157/76  140/79 136/74  Pulse: 96  95 85  Temp: 98.5 F (36.9 C)  98.6 F (37 C) 98.2 F (36.8 C)  TempSrc: Oral  Oral Oral  Resp: 16  16 16   Height:      Weight:      SpO2: 98% 98% 97% 100%    Intake/Output Summary (Last 24 hours) at 09/19/13 0956 Last data filed at 09/19/13 0630  Gross per 24 hour  Intake    635 ml  Output    950 ml  Net   -315 ml    Exam:  General: alert & oriented x 3 In NAD  Cardiovascular: RRR, nl S1 s2  Respiratory: Decreased breath sounds at the bases, scattered rhonchi, no crackles  Abdomen: soft +BS NT/ND, no masses palpable  Extremities: No cyanosis and no edema      Data Reviewed: Basic Metabolic Panel:  Recent Labs Lab 09/16/13 1849 09/17/13 0550 09/18/13 0506 09/19/13 0505  NA 137 138 139 139  K 5.4* 5.3 4.5 4.4  CL 105 106 106 105  CO2 20 20 22 22   GLUCOSE 127* 132* 115* 87  BUN 17 20 18 16   CREATININE 1.59* 1.64* 1.43* 1.21*  CALCIUM 8.3* 8.4 8.3* 8.1*    Liver Function Tests:  Recent Labs Lab 09/18/13 0506  AST 10  ALT 8  ALKPHOS 53  BILITOT <0.2*  PROT 6.1  ALBUMIN 1.5*   No results found for this basename: LIPASE, AMYLASE,  in the last 168 hours No results found for this basename: AMMONIA,  in the last 168 hours  CBC:  Recent Labs Lab 09/16/13 1849 09/17/13 0550 09/18/13 0506 09/19/13 0505  WBC 6.5 6.9 6.5 7.1  NEUTROABS 3.5  --   --   --   HGB 8.1* 7.1* 7.1* 7.4*  HCT 25.8* 22.2* 22.0* 23.1*  MCV 84.3 83.1 85.3 82.5  PLT 457* 396 371 368    Cardiac Enzymes: No results found for this basename: CKTOTAL, CKMB, CKMBINDEX, TROPONINI,  in the last 168 hours BNP (last 3 results) No results found for this  basename: PROBNP,  in the last 8760 hours   CBG:  Recent Labs Lab 09/18/13 0628 09/18/13 1036 09/18/13 1656 09/18/13 2215 09/19/13 0646  GLUCAP 110* 106* 98 96 80    Recent Results (from the past 240 hour(s))  CLOSTRIDIUM DIFFICILE BY PCR     Status: None   Collection Time    09/17/13  2:46 AM      Result Value Ref Range Status   C difficile by pcr NEGATIVE  NEGATIVE Final  URINE CULTURE     Status: None   Collection Time    09/17/13  2:48 AM      Result Value Ref Range Status   Specimen Description URINE, CATHETERIZED   Final   Special Requests NONE   Final   Culture  Setup Time     Final   Value: 09/17/2013 09:30     Performed at [facility]  Colony Count     Final   Value: 45,000 COLONIES/ML     Performed at [facility]   Culture     Final   Value: Multiple bacterial morphotypes present, none predominant. Suggest appropriate recollection if clinically indicated.     Performed at [facility]   Report Status 09/18/2013 FINAL   Final     Studies: Ct Angio Head W/cm &/or Wo Cm  09/05/2013   CLINICAL DATA:  Diabetes, hypertension, and prior stroke with left lower extremity abscess and sepsis. Multiple, likely embolic infarcts on MRI. Stroke workup.  EXAM: CT ANGIOGRAPHY HEAD AND NECK  TECHNIQUE: Multidetector CT imaging of the head and neck was performed using the standard protocol during bolus administration of intravenous contrast. Multiplanar CT image reconstructions and MIPs were obtained to evaluate the vascular anatomy. Carotid stenosis measurements (when applicable) are obtained utilizing NASCET criteria, using the distal internal carotid diameter as the denominator.  CONTRAST:  50mL OMNIPAQUE IOHEXOL 350 MG/ML SOLN  COMPARISON:  Brain MRI 09/03/2013  FINDINGS: CTA HEAD FINDINGS  Small foci of hypoattenuation are seen in the anterior body and genu of the corpus callosum, right globus pallidus, and left posterior left temporal periventricular  white matter corresponding to acute infarcts described on recent MRI additional, punctate acute infarcts on the MRI are not well demonstrated on the CT. Remote infarcts are noted in both cerebellar hemispheres and in the left basal ganglia/ corona radiata. There is no evidence of acute large territory cortical infarct, intracranial hemorrhage, midline shift, or extra-axial fluid collection. There is mild ex vacuo dilatation of the body of the left lateral ventricle. No abnormal enhancement is identified. Orbits are unremarkable. Mastoid air cells and paranasal sinuses are clear.  The intracranial right vertebral artery is patent, dominant, and supplies the basilar. The left intracranial vertebral artery is markedly small in caliber and only intermittently patent. PICA origins appear patent. AICA and SCA origins are patent. Basilar artery is mildly irregular but without stenosis. There is a focal, mild-to-moderate distal right P1 stenosis. There is irregular, mild to moderate attenuation of left P2 and P3 branches. Posterior communicating arteries are not clearly identified.  The proximal right intracranial ICA is diffusely small in caliber with a severe stenosis at the petrous-cavernous junction. The right ICA becomes occluded at the anterior cavernous segment. There is minimal to no reconstitution of the right intracranial ICA distal to this. Multiple collateral vessels are seen in the expected region of the right ICA terminus. These collateral vessels reconstitute the right MCA near the MCA bifurcation. Major right M2 branches appear patent although mildly smaller in caliber than those on the left. A small amount of contrast is present in the right A1 segment, with the A2 being supplied predominantly by the left ACA via the anterior communicating artery, which appears somewhat bulbous.  The left internal carotid artery is patent from skullbase to carotid terminus. There is mild calcification and moderate  irregularity of the left carotid siphon. There is mild narrowing of the left ICA at the petrous-cavernous junction. There is diffuse irregularity with mild to moderate tandem stenoses involving the anterior genu of the left cavernous carotid as well as the supraclinoid left ICA. There is mild stenosis of the proximal left A1 segment. There is also mild proximal left M1 stenosis. There is mild, diffuse irregularity of left M2 and more distal branches.  Review of the MIP images confirms the above findings.  CTA NECK FINDINGS  Three vessel aortic arch. Subclavian  arteries are grossly unremarkable. Evaluation of the proximal common carotid arteries is limited by image noise due to shoulder attenuation as well as mild motion artifact. No mid or distal common carotid artery stenosis is seen. There is moderate focal narrowing of the right internal carotid artery at its origin. The right ICA obtains a more normal caliber for approximately 1 cm beyond the stenosis before becoming narrowed again and demonstrating a diffusely narrowed but smooth luminal contour for the remainder of its course in the neck. The right ICA lumen near its origin at the site of proximal stenosis measures approximately 2.3 mm in diameter, with the diffusely narrowed mid to distal right cervical ICA measuring approximately 2.7 mm in diameter. The left cervical ICA is mildly tortuous but patent and without stenosis.  The proximal right vertebral artery is grossly patent but suboptimally evaluated due to artifact. The V2 and V3 segments of the right vertebral artery are normal in appearance. The left vertebral artery appears occluded at its origin with reconstitution at the C4 level, with the lumen of the vertebral artery being markedly small in caliber distal to this.  A left subclavian approach central venous catheter is partially visualized. Jugulodigastric lymph nodes measure up to 10 mm in short axis.  Review of the MIP images confirms the above  findings.  IMPRESSION: 1. Occlusion of the intracranial right ICA. Multiple small collateral vessels in the region of the right ICA terminus is indicative of chronic occlusion. Right MCA is reconstituted new the MCA bifurcation, with slightly diminished distal flow compared to the left. Right ACA is mainly supplied via the anterior communicating artery. Right ICA is diffusely small in the neck, with moderate stenosis at its origin. 2. Moderate left carotid siphon atherosclerosis. Mild to moderate proximal right PCA stenosis. Mild to moderate attenuation of distal left PCA branch vessels. 3. 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 4. Dominant right vertebral artery without stenosis identified. 5. Small, acute bilateral infarcts as described on recent MRI.   Electronically Signed   By: Sebastian AcheAllen  Grady   On: 09/05/2013 20:48   Ct Angio Neck W/cm &/or Wo/cm  09/05/2013   CLINICAL DATA:  Diabetes, hypertension, and prior stroke with left lower extremity abscess and sepsis. Multiple, likely embolic infarcts on MRI. Stroke workup.  EXAM: CT ANGIOGRAPHY HEAD AND NECK  TECHNIQUE: Multidetector CT imaging of the head and neck was performed using the standard protocol during bolus administration of intravenous contrast. Multiplanar CT image reconstructions and MIPs were obtained to evaluate the vascular anatomy. Carotid stenosis measurements (when applicable) are obtained utilizing NASCET criteria, using the distal internal carotid diameter as the denominator.  CONTRAST:  50mL OMNIPAQUE IOHEXOL 350 MG/ML SOLN  COMPARISON:  Brain MRI 09/03/2013  FINDINGS: CTA HEAD FINDINGS  Small foci of hypoattenuation are seen in the anterior body and genu of the corpus callosum, right globus pallidus, and left posterior left temporal periventricular white matter corresponding to acute infarcts described on recent MRI additional, punctate acute infarcts on the MRI are not well  demonstrated on the CT. Remote infarcts are noted in both cerebellar hemispheres and in the left basal ganglia/ corona radiata. There is no evidence of acute large territory cortical infarct, intracranial hemorrhage, midline shift, or extra-axial fluid collection. There is mild ex vacuo dilatation of the body of the left lateral ventricle. No abnormal enhancement is identified. Orbits are unremarkable. Mastoid air cells and paranasal sinuses are clear.  The intracranial right vertebral  artery is patent, dominant, and supplies the basilar. The left intracranial vertebral artery is markedly small in caliber and only intermittently patent. PICA origins appear patent. AICA and SCA origins are patent. Basilar artery is mildly irregular but without stenosis. There is a focal, mild-to-moderate distal right P1 stenosis. There is irregular, mild to moderate attenuation of left P2 and P3 branches. Posterior communicating arteries are not clearly identified.  The proximal right intracranial ICA is diffusely small in caliber with a severe stenosis at the petrous-cavernous junction. The right ICA becomes occluded at the anterior cavernous segment. There is minimal to no reconstitution of the right intracranial ICA distal to this. Multiple collateral vessels are seen in the expected region of the right ICA terminus. These collateral vessels reconstitute the right MCA near the MCA bifurcation. Major right M2 branches appear patent although mildly smaller in caliber than those on the left. A small amount of contrast is present in the right A1 segment, with the A2 being supplied predominantly by the left ACA via the anterior communicating artery, which appears somewhat bulbous.  The left internal carotid artery is patent from skullbase to carotid terminus. There is mild calcification and moderate irregularity of the left carotid siphon. There is mild narrowing of the left ICA at the petrous-cavernous junction. There is diffuse  irregularity with mild to moderate tandem stenoses involving the anterior genu of the left cavernous carotid as well as the supraclinoid left ICA. There is mild stenosis of the proximal left A1 segment. There is also mild proximal left M1 stenosis. There is mild, diffuse irregularity of left M2 and more distal branches.  Review of the MIP images confirms the above findings.  CTA NECK FINDINGS  Three vessel aortic arch. Subclavian arteries are grossly unremarkable. Evaluation of the proximal common carotid arteries is limited by image noise due to shoulder attenuation as well as mild motion artifact. No mid or distal common carotid artery stenosis is seen. There is moderate focal narrowing of the right internal carotid artery at its origin. The right ICA obtains a more normal caliber for approximately 1 cm beyond the stenosis before becoming narrowed again and demonstrating a diffusely narrowed but smooth luminal contour for the remainder of its course in the neck. The right ICA lumen near its origin at the site of proximal stenosis measures approximately 2.3 mm in diameter, with the diffusely narrowed mid to distal right cervical ICA measuring approximately 2.7 mm in diameter. The left cervical ICA is mildly tortuous but patent and without stenosis.  The proximal right vertebral artery is grossly patent but suboptimally evaluated due to artifact. The V2 and V3 segments of the right vertebral artery are normal in appearance. The left vertebral artery appears occluded at its origin with reconstitution at the C4 level, with the lumen of the vertebral artery being markedly small in caliber distal to this.  A left subclavian approach central venous catheter is partially visualized. Jugulodigastric lymph nodes measure up to 10 mm in short axis.  Review of the MIP images confirms the above findings.  IMPRESSION: 1. Occlusion of the intracranial right ICA. Multiple small collateral vessels in the region of the right ICA  terminus is indicative of chronic occlusion. Right MCA is reconstituted new the MCA bifurcation, with slightly diminished distal flow compared to the left. Right ACA is mainly supplied via the anterior communicating artery. Right ICA is diffusely small in the neck, with moderate stenosis at its origin. 2. Moderate left carotid siphon atherosclerosis. Mild to moderate  proximal right PCA stenosis. Mild to moderate attenuation of distal left PCA branch vessels. 3. 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 4. Dominant right vertebral artery without stenosis identified. 5. Small, acute bilateral infarcts as described on recent MRI.   Electronically Signed   By: Sebastian Ache   On: 09/05/2013 20:48   Mr Brain Wo Contrast  09/03/2013   CLINICAL DATA:  Change in level of consciousness. Rule out stroke. Thigh abscess with sepsis.  EXAM: MRI HEAD WITHOUT CONTRAST  TECHNIQUE: Multiplanar, multiecho pulse sequences of the brain and surrounding structures were obtained without intravenous contrast.  COMPARISON:  None.  FINDINGS: Images are mildly to moderately degraded by motion. There are numerous punctate foci of restricted diffusion involving cortex and white matter of both cerebral hemispheres consistent with acute infarcts. Several slightly larger foci of acute infarction are present in the corpus callosum measuring up to approximately 1.3 cm in size. Small, acute infarcts are also present in the right greater than left basal ganglia, and there is a punctate acute infarct anteriorly in the right aspect of the medulla. There may be a small amount of hemorrhage associated with a few of these punctate infarcts. Old infarcts are present in the bilateral basal ganglia and bilateral cerebellum.  There is no mass, midline shift, or extra-axial fluid collection. There is mild cerebral and cerebellar atrophy. Orbits are unremarkable. Minimal ethmoid air cell mucosal  thickening is noted. Mastoid air cells are clear.  IMPRESSION: 1. Multiple small infarcts involving both cerebral hemispheres, corpus callosum, and medulla, consistent with embolic infarcts. These could be bland or septic emboli given history. 2. 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   Electronically Signed   By: Sebastian Ache   On: 09/03/2013 17:38   Dg Chest Port 1 View  09/05/2013   CLINICAL DATA:  Left-sided PICC placement.  EXAM: PORTABLE CHEST - 1 VIEW  COMPARISON:  Chest x-ray 08/31/2013.  FINDINGS: There is a left upper extremity PICC with tip terminating in the superior aspect of the right atrium (approximately 3 cm beyond the superior cavoatrial junction). No pneumothorax. No acute consolidative airspace disease. No pleural effusions. No evidence of pulmonary edema. Mild cardiomegaly. Upper mediastinal contours are within normal limits.  IMPRESSION: 1. Tip of PICC is in the right atrium approximately 3 cm distal to the superior cavoatrial junction. 2. Status post extubation with significantly improved aeration. 3. Mild cardiomegaly.   Electronically Signed   By: Trudie Reed M.D.   On: 09/05/2013 14:21   Portable Chest Xray In Am  08/31/2013   CLINICAL DATA:  Respiratory failure.  EXAM: PORTABLE CHEST - 1 VIEW  COMPARISON:  08/30/2013.  FINDINGS: The endotracheal tube is 4 cm above the carina. The NG tube is coursing down the esophagus and into the stomach. The heart is enlarged but unchanged. Persistent mild perihilar edema and probable small effusions.  IMPRESSION: Stable support apparatus.  Persistent perihilar edema and areas of atelectasis.   Electronically Signed   By: Loralie Champagne M.D.   On: 08/31/2013 07:29   Dg Chest Port 1 View  08/30/2013   CLINICAL DATA:  Endotracheal tube placement.  EXAM: PORTABLE CHEST - 1 VIEW  COMPARISON:  08/30/2013 and 02/21/2013.  FINDINGS: 2214 hr. Endotracheal tube appears unchanged, within the mid trachea. There is improved aeration of the lungs  with residual patchy left greater than right basilar airspace opacities. There is no pleural effusion or pneumothorax. The heart size and  mediastinal contours are stable.  IMPRESSION: Interval improved aeration of the lung bases. Endotracheal tube appears unchanged within the midtrachea.   Electronically Signed   By: Roxy Horseman M.D.   On: 08/30/2013 23:16   Portable Chest Xray  08/30/2013   CLINICAL DATA:  Endotracheal tube placement  EXAM: PORTABLE CHEST - 1 VIEW  COMPARISON:  Chest radiograph 02/21/2013  FINDINGS: Markedly low lung volumes limit evaluation. The endotracheal tube terminates within the distal trachea approximately 5 mm superior to the carina. Cardiomegaly. Heterogeneous lower lung opacities, left greater than right. No pleural effusion or pneumothorax.  IMPRESSION: Endotracheal tube terminates within the distal trachea. Recommend retraction and re-evaluation with repeat radiograph.  Scattered heterogeneous opacities favored to represent atelectasis in the setting of markedly low lung volumes.  Discussed with Nurse Clarene Critchley at 8:43 p.m. on 08/30/2013.   Electronically Signed   By: Annia Belt M.D.   On: 08/30/2013 20:44   Dg Abd Portable 1v  08/31/2013   CLINICAL DATA:  Enteric tube placement.  EXAM: PORTABLE ABDOMEN - 1 VIEW  COMPARISON:  Lumbar spine radiographs performed 09/26/2011  FINDINGS: The patient's enteric tube is seen ending at the body of the stomach, with the sideport at the fundus of the stomach.  The visualized bowel gas pattern is grossly unremarkable, with scattered air noted in the colon. No free intra-abdominal air is identified, though evaluation for free air is limited on a single supine view and due to motion artifact. No acute osseous abnormalities are seen.  IMPRESSION: Enteric tube is seen ending at the body of the stomach, with the sideport at the fundus of the stomach.   Electronically Signed   By: Roanna Raider M.D.   On: 08/31/2013 05:45    Scheduled  Meds: . sodium chloride   Intravenous Once  . sodium chloride   Intravenous Once  . sodium chloride   Intravenous Once  . amLODipine  10 mg Oral Daily  . ampicillin-sulbactam (UNASYN) IV  3 g Intravenous Q6H  . arformoterol  15 mcg Nebulization Q12H  . aspirin EC  325 mg Oral Daily  . atorvastatin  20 mg Oral q1800  . budesonide  0.25 mg Nebulization Q12H  . feeding supplement (PRO-STAT SUGAR FREE 64)  30 mL Oral BID WC  . gemfibrozil  600 mg Oral BID AC  . insulin aspart  0-15 Units Subcutaneous TID WC  . insulin glargine  10 Units Subcutaneous Daily  . multivitamin with minerals  1 tablet Oral Daily  . saccharomyces boulardii  250 mg Oral BID   Continuous Infusions: . sodium chloride 50 mL/hr at 09/19/13 1610    Principal Problem:   Bleeding from wound Active Problems:   Diabetes mellitus with hyperglycemia   AKI (acute kidney injury)   Cerebral embolism with cerebral infarction   Hypertriglyceridemia   Diarrhea    Time spent: 40 minutes   [facility]  [facility] Pager [phone]. If 8PM-8AM, please contact night-coverage at [email or web address], password [facility] 09/19/2013, 9:56 AM  LOS: 3 days

## 2013-09-20 LAB — TYPE AND SCREEN
ABO/RH(D): A POS
ANTIBODY SCREEN: NEGATIVE
UNIT DIVISION: 0
Unit division: 0
Unit division: 0

## 2013-09-20 LAB — COMPREHENSIVE METABOLIC PANEL
ALT: 8 U/L (ref 0–35)
ANION GAP: 12 (ref 5–15)
AST: 9 U/L (ref 0–37)
Albumin: 1.4 g/dL — ABNORMAL LOW (ref 3.5–5.2)
Alkaline Phosphatase: 49 U/L (ref 39–117)
BUN: 14 mg/dL (ref 6–23)
CO2: 22 mEq/L (ref 19–32)
CREATININE: 1.12 mg/dL — AB (ref 0.50–1.10)
Calcium: 8 mg/dL — ABNORMAL LOW (ref 8.4–10.5)
Chloride: 106 mEq/L (ref 96–112)
GFR calc Af Amer: 72 mL/min — ABNORMAL LOW (ref >90)
GFR calc non Af Amer: 62 mL/min — ABNORMAL LOW (ref >90)
Glucose, Bld: 130 mg/dL — ABNORMAL HIGH (ref 70–99)
Potassium: 3.9 mEq/L (ref 3.7–5.3)
Sodium: 140 mEq/L (ref 137–147)
TOTAL PROTEIN: 5.9 g/dL — AB (ref 6.0–8.3)
Total Bilirubin: <0.2 mg/dL — ABNORMAL LOW (ref 0.3–1.2)

## 2013-09-20 LAB — CBC
HEMATOCRIT: 24.6 % — AB (ref 36.0–46.0)
Hemoglobin: 7.9 g/dL — ABNORMAL LOW (ref 12.0–15.0)
MCH: 26.3 pg (ref 26.0–34.0)
MCHC: 32.1 g/dL (ref 30.0–36.0)
MCV: 82 fL (ref 78.0–100.0)
Platelets: 360 10*3/uL (ref 150–400)
RBC: 3 MIL/uL — ABNORMAL LOW (ref 3.87–5.11)
RDW: 15.9 % — AB (ref 11.5–15.5)
WBC: 7.8 10*3/uL (ref 4.0–10.5)

## 2013-09-20 LAB — GLUCOSE, CAPILLARY
GLUCOSE-CAPILLARY: 128 mg/dL — AB (ref 70–99)
Glucose-Capillary: 85 mg/dL (ref 70–99)
Glucose-Capillary: 220 mg/dL — ABNORMAL HIGH (ref 70–99)

## 2013-09-20 MED ORDER — GLUCERNA SHAKE PO LIQD: 237.0000 mL | Freq: Two times a day (BID) | ORAL | Status: DC

## 2013-09-20 MED ORDER — FERROUS SULFATE 300 (60 FE) MG/5ML PO SYRP
300.0000 mg | ORAL_SOLUTION | Freq: Two times a day (BID) | ORAL | Status: DC
  Administered 2013-09-20: 300 mg via ORAL
  Filled 2013-09-20 (×6): qty 5

## 2013-09-20 MED ORDER — OXYCODONE HCL 5 MG PO TABS: 5.0000 mg | ORAL_TABLET | Freq: Three times a day (TID) | ORAL | Status: DC | PRN

## 2013-09-20 MED ORDER — FERROUS SULFATE 300 (60 FE) MG/5ML PO SYRP: 300.0000 mg | ORAL_SOLUTION | Freq: Two times a day (BID) | ORAL | Status: DC

## 2013-09-20 MED ORDER — INSULIN GLARGINE 100 UNIT/ML ~~LOC~~ SOLN
15.0000 [IU] | Freq: Every day | SUBCUTANEOUS | Status: DC
Start: 2013-09-20 — End: 2015-02-27

## 2013-09-20 MED ORDER — FERROUS SULFATE NICU 15 MG (ELEMENTAL IRON)/ML: 2.0000 mg/kg | Freq: Two times a day (BID) | ORAL | Status: DC

## 2013-09-20 MED ORDER — AMLODIPINE BESYLATE 10 MG PO TABS: 10.0000 mg | ORAL_TABLET | Freq: Every day | ORAL | Status: DC

## 2013-09-20 MED ORDER — SACCHAROMYCES BOULARDII 250 MG PO CAPS: 250.0000 mg | ORAL_CAPSULE | Freq: Two times a day (BID) | ORAL | Status: DC

## 2013-09-20 MED ORDER — AMOXICILLIN-POT CLAVULANATE 875-125 MG PO TABS: 1.0000 | ORAL_TABLET | Freq: Two times a day (BID) | ORAL | Status: DC

## 2013-09-20 MED ORDER — BOOST / RESOURCE BREEZE PO LIQD: 1.0000 | Freq: Two times a day (BID) | ORAL | Status: DC

## 2013-09-20 MED ORDER — DOXYCYCLINE HYCLATE 50 MG PO CAPS: 50.0000 mg | ORAL_CAPSULE | Freq: Two times a day (BID) | ORAL | Status: DC

## 2013-09-20 NOTE — Discharge Instructions (Signed)
Follow-up Information  Follow up with Xu,Jindong, MD. Schedule an appointment as soon as possible for a visit in 2 months. (Stroke Clinic)  Specialty: Neurology  Contact information:  [address]  [place] [id number]  [phone]  Follow up with Marigene Ehlersamirez Jr., Jed LimerickArmando, MD In 2 weeks. (For post-operation check. Please call the office to verify your appointment time and date in 2-3 weeks.)  Specialty: General Surgery  Contact information:  [address]  [place] [address]  [phone]  Follow up with Lonia BloodGARBA,LAWAL, MD. Schedule an appointment as soon as possible for a visit in 1 week. [facility](Hospital followup)  Specialty: Internal Medicine  Contact information:  [address]  [place] [address]  [phone]

## 2013-09-20 NOTE — Evaluation (Signed)
Physical Therapy Evaluation Patient Details Name: [name] MRN: [id number] DOB: 1975/05/22 Today's Date: 09/20/2013   History of Present Illness  38 y.o. female s/p Left thigh wound status post surgical drainage and debridement of abscess presented to hospital with bleeding wound. Hx of DM, morbid obesity, CVA, HTN, and GERD.  Clinical Impression  Pt admitted with above. Pt currently with functional limitations due to the deficits listed below (see PT Problem List). Therapy focused on bed mobility, transfers, education and d/c planning for safety. Pt will benefit from skilled PT to increase their independence and safety with mobility to allow discharge to the venue listed below.       Follow Up Recommendations SNF;Supervision/Assistance - 24 hour    Equipment Recommendations  3in1 (PT)    Recommendations for Other Services       Precautions / Restrictions Precautions Precautions: Fall Restrictions Weight Bearing Restrictions: No      Mobility  Bed Mobility Overal bed mobility: Needs Assistance Bed Mobility: Rolling;Sidelying to Sit;Sit to Supine Rolling: Min assist Sidelying to sit: Min assist   Sit to supine: Mod assist;+2 for physical assistance   General bed mobility comments: Min assist for rolling and sidelying to sit. unable to perform complete roll due to body habitus. VC for technique. Mod assist+2 for sit>supine; requiring LE assist and trendelenburg position on bed to scoot into position.  Transfers Overall transfer level: Needs assistance Equipment used: Rolling walker (2 wheeled) Transfers: Sit to/from Stand Sit to Stand: Mod assist;+2 physical assistance         General transfer comment: VC for hand placement. Educated on technique to stand. 2 attempts +2 assist for boost from lowest bed setting. Pt able to stand 3/4 of the way up but quickly fatigued and was unable to participate further in therapy as she became lethargic.  Ambulation/Gait                 Stairs            Wheelchair Mobility    Modified Rankin (Stroke Patients Only)       Balance                                             Pertinent Vitals/Pain Pain Assessment: No/denies pain    Home Living Family/patient expects to be discharged to:: Skilled nursing facility Living Arrangements: Spouse/significant other;Children (also lives with sister and mother) Available Help at Discharge: Skilled Nursing Facility Type of Home: Apartment Home Access: Stairs to enter Entrance Stairs-Rails: Left Entrance Stairs-Number of Steps: 14 Home Layout: One level Home Equipment: Environmental consultant - 2 wheels Additional Comments: was being bathed/dressed at SNF where she has come from most recently    Prior Function Level of Independence: Needs assistance   Gait / Transfers Assistance Needed: Non-ambulatory while in SNF  ADL's / Homemaking Assistance Needed: SNF staff would help perform all ADLs        Hand Dominance   Dominant Hand: Right    Extremity/Trunk Assessment   Upper Extremity Assessment: Defer to OT evaluation           Lower Extremity Assessment: Generalized weakness         Communication   Communication: No difficulties  Cognition Arousal/Alertness: Awake/alert Behavior During Therapy: Flat affect Overall Cognitive Status: Impaired/Different from baseline Area of Impairment: Orientation Orientation Level: Disoriented to;Time ("february" this is august)  General Comments General comments (skin integrity, edema, etc.): Spoke in depth about d/c planning and safety with mobility. Patient's husband and sister present during therapy session. She was very keen on returning home to see her children but after lengthy discussion patient and husband agreed she would improve her functional independence [facility]     Exercises        Assessment/Plan    PT Assessment Patient needs  continued PT services  PT Diagnosis Difficulty walking;Generalized weakness;Altered mental status   PT Problem List Decreased strength;Decreased range of motion;Decreased activity tolerance;Decreased balance;Decreased mobility;Decreased coordination;Decreased cognition;Decreased knowledge of use of DME;Decreased safety awareness;Obesity;Decreased skin integrity  PT Treatment Interventions DME instruction;Gait training;Functional mobility training;Therapeutic activities;Therapeutic exercise;Balance training;Neuromuscular re-education;Cognitive remediation;Patient/family education;Modalities   PT Goals (Current goals can be found in the Care Plan section) Acute Rehab PT Goals Patient Stated Goal: See my kids PT Goal Formulation: With patient/family Time For Goal Achievement: 09/27/13 Potential to Achieve Goals: Fair    Frequency Min 3X/week   Barriers to discharge Inaccessible home environment 14 steps to climb and patient is non-ambulatory    Co-evaluation               End of Session   Activity Tolerance: Patient limited by fatigue;Patient limited by lethargy Patient left: in bed;with call bell/phone within reach;with family/visitor present Nurse Communication: Mobility status;Other (comment) ("PICC line Occluded")         Time: [id number] PT Time Calculation (min): 47 min   Charges:   PT Evaluation $Initial PT Evaluation Tier I: 1 Procedure PT Treatments $Therapeutic Activity: 8-22 mins $Self Care/Home Management: 8-22   PT G Codes:         [name], PT [phone]  [name], [name] 09/20/2013, 3:23 PM

## 2013-09-20 NOTE — Care Management Note (Signed)
09/20/13 1530 Linda PeperSusan Amy Gothard, RN BSN Case Manager Patient has reluctantly decided that [facility]. Patient was unable to participate with therapist. Whitney PostLogan, PT, explained to patient and family that she would not be safe at home and they are not able to provide level of care that she requires. Case manager notified Dr. Susie CassetteAbrol of discharge plan change. Social [facility] will be transported via [facility]. Case Manager notified Judeth CornfieldStephanie, [facility] liaison of change in discharge plan.

## 2013-09-20 NOTE — Progress Notes (Signed)
OT Cancellation Note and Discharge-defer to SNF   Patient Details Name: Linda HarmsDavenia [name] MRN: [id number] DOB: 06/11/1975   Cancelled Treatment:    Reason Eval/Treat Not Completed: Other (comment). Pt sleeping soundly, arouses only momentarily to voice. Spoke with PT who only saw her 20 minutes ago. Pt has bed at SNF and D/C orders, will defer OT to this facility. We will D/C from acute OT.  Evette GeorgesLeonard, Caralynn Gelber [name] [phone] 09/20/2013, 2:38 PM

## 2013-09-20 NOTE — Progress Notes (Signed)
Calorie Count Note  48 hour calorie count ordered.  Diet: Heart Healthy/Carb Modified Supplements: Pro-stat 30 ml BID  Breakfast: 140 kcal, 7 grams Lunch: 0 kcal, 0 grams Dinner: 50 kcal, 5 grams protein Supplements: none  Total intake: 190 kcal (9% of minimum estimated needs)  12 protein (8% of minimum estimated needs)  Pt's oral intake remains inadequate. She denies and nausea, vomiting, constipation or diarrhea. She states she has no appetite and reports she hasn't been taking Pro-stat liquids protein. Pt agreeable to trying nutritional supplements.   Nutrition Dx: Inadequate oral intake related to decreased appetite as evidenced by 0% intake of meals; ongoing  Goal: Patient will meet >/=90% of estimated nutrition needs  Intervention:  Provide Glucerna Shakes BID   Provide Resource Breeze BID   Continue Multivitamin with minerals daily   Encourage PO intake  Ian Malkineanne Barnett RD, LDN Inpatient Clinical Dietitian Pager: [phone] After Hours Pager: [phone]

## 2013-09-20 NOTE — Clinical Documentation Improvement (Signed)
Please clarify if there is a link between the DM and wound to left thigh abscess and document in pn or d/c summary  . Document any associated diagnoses/conditions . Manifestation/Complication (document link to diabetes) --Circulatory complications --Hyperosmolarity --Kidney complications --Neurological complications --Skin complications --Arthropathy --Other (specify)  Supporting Information:   Thank You,   Cindie LarocheLolita J. Ambrose MantleHenley RN, BSN, MSN/Inf, CCDS Clinical Documentation Specialist [facility] Pager: [phone]

## 2013-09-20 NOTE — Progress Notes (Signed)
[facility] [place] Report called to [facility], RN at facility. IV was removed, rectal tube removed, and wound dressing was changed. Foley still in place per Dr. Susie CassetteAbrol. Reported to facility that foley to be removed when wound showing improvement. Patient left unit in a stable condition via EMS.

## 2013-09-20 NOTE — Progress Notes (Signed)
SW spoke with pt in regards to discharge plan. SW shared with pt that she will discharged today to a SNF. Pt reported that she will not go to a SNF and it is her plan to return home. Pt reported that her husband and sister will care for her at home. SW has made several attempts to contact family members, no successful. Will continue to try. Attending MD and RN made aware.   Derrell Lollingoris Tashari Schoenfelder, MSW [phone]

## 2013-09-20 NOTE — Discharge Summary (Addendum)
Physician Discharge Summary  Hasel Janish MRN: [id number] DOB/AGE: [id number] 37 y.o.  PCP: Barbette Merino, MD   Admit date: 09/16/2013 Discharge date: 09/20/2013  Discharge Diagnoses:  Iron deficiency anemia   Bleeding from wound Multiple embolic infarcts  Left thigh wound status post surgical drainage and debridement of abscess  Sepsis secondary to left thigh abscess  Acute encephalopathy secondary to sepsis versus CVA  Acute respiratory failure secondary to sepsis with history of asthma  Acute kidney injury  Hyponatremia  Leukocytosis  Microcytic anemia  Diabetes mellitus type 2, uncontrolled  Hypertriglyceridemia  Diarrhea          Follow recommendations   Recommendations for Outpatient Follow-up:  Patient refusing to be discharged to a skilled nursing facility  She will need to follow up with her primary care physician within one week of discharge. Patient also to followup with neurology within 2 months of discharge. Patient also may need to have a CBC within one week of discharge. Patient will need followup with Dr. Rosendo Gros, general surgeon within 2-3 weeks of discharge. Patient will continue to need wound care twice daily, with wet-to-dry dressing changes.   Patient should continue her medications as prescribed.  She should physical therapy as outlined by PT     Medication List    STOP taking these medications       HYDROcodone-acetaminophen 5-325 MG per tablet  Commonly known as:  NORCO/VICODIN     loperamide 2 MG capsule  Commonly known as:  IMODIUM     triamterene-hydrochlorothiazide 75-50 MG per tablet  Commonly known as:  MAXZIDE      TAKE these medications       acetaminophen 325 MG tablet  Commonly known as:  TYLENOL  Take 650 mg by mouth every 6 (six) hours as needed for mild pain, fever or headache.     albuterol 108 (90 BASE) MCG/ACT inhaler  Commonly known as:  PROVENTIL HFA;VENTOLIN HFA  Inhale 2 puffs into the lungs every 4 (four)  hours as needed for wheezing or shortness of breath.     amLODipine 10 MG tablet  Commonly known as:  NORVASC  Take 1 tablet (10 mg total) by mouth daily.     amoxicillin-clavulanate 875-125 MG per tablet  Commonly known as:  AUGMENTIN  Take 1 tablet by mouth 2 (two) times daily.     antiseptic oral rinse Liqd  15 mLs by Mouth Rinse route daily as needed for dry mouth.     arformoterol 15 MCG/2ML Nebu  Commonly known as:  BROVANA  Take 15 mcg by nebulization every 12 (twelve) hours.     aspirin 325 MG EC tablet  Take 1 tablet (325 mg total) by mouth daily.     atorvastatin 20 MG tablet  Commonly known as:  LIPITOR  Take 1 tablet (20 mg total) by mouth daily at 6 PM.     budesonide 0.25 MG/2ML nebulizer solution  Commonly known as:  PULMICORT  Take 0.25 mg by nebulization every 12 (twelve) hours.     doxycycline 50 MG capsule  Commonly known as:  VIBRAMYCIN  Take 1 capsule (50 mg total) by mouth 2 (two) times daily.     feeding supplement (PRO-STAT SUGAR FREE 64) Liqd  Take 30 mLs by mouth 2 (two) times daily with a meal.     ferrous sulfate 300 (60 FE) MG/5ML syrup  Take 5 mLs (300 mg total) by mouth 2 (two) times daily with a meal.  gemfibrozil 600 MG tablet  Commonly known as:  LOPID  Take 1 tablet (600 mg total) by mouth 2 (two) times daily before a meal.     insulin aspart 100 UNIT/ML injection  Commonly known as:  novoLOG  - Inject 0-20 Units into the skin 3 (three) times daily with meals. CBG < 70: implement hypoglycemia protocol  - CBG 70 - 120: 0 units  - CBG 121 - 150: 3 units  - CBG 151 - 200: 4 units  - CBG 201 - 250: 7 units  - CBG 251 - 300: 11 units  - CBG 301 - 350: 15 units  - CBG 351 - 400: 20 units     insulin glargine 100 UNIT/ML injection  Commonly known as:  LANTUS  Inject 0.15 mLs (15 Units total) into the skin daily.     insulin starter kit- syringes Misc  1 kit by Other route once.     multivitamin with minerals Tabs tablet   Take 1 tablet by mouth daily.     oxyCODONE 5 MG immediate release tablet  Commonly known as:  Oxy IR/ROXICODONE  Take 1 tablet (5 mg total) by mouth 3 (three) times daily as needed for moderate pain.     saccharomyces boulardii 250 MG capsule  Commonly known as:  FLORASTOR  Take 250 mg by mouth 2 (two) times daily.     saccharomyces boulardii 250 MG capsule  Commonly known as:  FLORASTOR  Take 1 capsule (250 mg total) by mouth 2 (two) times daily.        Discharge Condition: Guarded   Disposition: 03-Skilled Nursing Facility   Consults:  Surgery   Significant Diagnostic Studies: Ct Angio Head W/cm &/or Wo Cm  09/05/2013   CLINICAL DATA:  Diabetes, hypertension, and prior stroke with left lower extremity abscess and sepsis. Multiple, likely embolic infarcts on MRI. Stroke workup.  EXAM: CT ANGIOGRAPHY HEAD AND NECK  TECHNIQUE: Multidetector CT imaging of the head and neck was performed using the standard protocol during bolus administration of intravenous contrast. Multiplanar CT image reconstructions and MIPs were obtained to evaluate the vascular anatomy. Carotid stenosis measurements (when applicable) are obtained utilizing NASCET criteria, using the distal internal carotid diameter as the denominator.  CONTRAST:  55m OMNIPAQUE IOHEXOL 350 MG/ML SOLN  COMPARISON:  Brain MRI 09/03/2013  FINDINGS: CTA HEAD FINDINGS  Small foci of hypoattenuation are seen in the anterior body and genu of the corpus callosum, right globus pallidus, and left posterior left temporal periventricular white matter corresponding to acute infarcts described on recent MRI additional, punctate acute infarcts on the MRI are not well demonstrated on the CT. Remote infarcts are noted in both cerebellar hemispheres and in the left basal ganglia/ corona radiata. There is no evidence of acute large territory cortical infarct, intracranial hemorrhage, midline shift, or extra-axial fluid collection. There is mild ex  vacuo dilatation of the body of the left lateral ventricle. No abnormal enhancement is identified. Orbits are unremarkable. Mastoid air cells and paranasal sinuses are clear.  The intracranial right vertebral artery is patent, dominant, and supplies the basilar. The left intracranial vertebral artery is markedly small in caliber and only intermittently patent. PICA origins appear patent. AICA and SCA origins are patent. Basilar artery is mildly irregular but without stenosis. There is a focal, mild-to-moderate distal right P1 stenosis. There is irregular, mild to moderate attenuation of left P2 and P3 branches. Posterior communicating arteries are not clearly identified.  The proximal right intracranial ICA  is diffusely small in caliber with a severe stenosis at the petrous-cavernous junction. The right ICA becomes occluded at the anterior cavernous segment. There is minimal to no reconstitution of the right intracranial ICA distal to this. Multiple collateral vessels are seen in the expected region of the right ICA terminus. These collateral vessels reconstitute the right MCA near the MCA bifurcation. Major right M2 branches appear patent although mildly smaller in caliber than those on the left. A small amount of contrast is present in the right A1 segment, with the A2 being supplied predominantly by the left ACA via the anterior communicating artery, which appears somewhat bulbous.  The left internal carotid artery is patent from skullbase to carotid terminus. There is mild calcification and moderate irregularity of the left carotid siphon. There is mild narrowing of the left ICA at the petrous-cavernous junction. There is diffuse irregularity with mild to moderate tandem stenoses involving the anterior genu of the left cavernous carotid as well as the supraclinoid left ICA. There is mild stenosis of the proximal left A1 segment. There is also mild proximal left M1 stenosis. There is mild, diffuse irregularity of  left M2 and more distal branches.  Review of the MIP images confirms the above findings.  CTA NECK FINDINGS  Three vessel aortic arch. Subclavian arteries are grossly unremarkable. Evaluation of the proximal common carotid arteries is limited by image noise due to shoulder attenuation as well as mild motion artifact. No mid or distal common carotid artery stenosis is seen. There is moderate focal narrowing of the right internal carotid artery at its origin. The right ICA obtains a more normal caliber for approximately 1 cm beyond the stenosis before becoming narrowed again and demonstrating a diffusely narrowed but smooth luminal contour for the remainder of its course in the neck. The right ICA lumen near its origin at the site of proximal stenosis measures approximately 2.3 mm in diameter, with the diffusely narrowed mid to distal right cervical ICA measuring approximately 2.7 mm in diameter. The left cervical ICA is mildly tortuous but patent and without stenosis.  The proximal right vertebral artery is grossly patent but suboptimally evaluated due to artifact. The V2 and V3 segments of the right vertebral artery are normal in appearance. The left vertebral artery appears occluded at its origin with reconstitution at the C4 level, with the lumen of the vertebral artery being markedly small in caliber distal to this.  A left subclavian approach central venous catheter is partially visualized. Jugulodigastric lymph nodes measure up to 10 mm in short axis.  Review of the MIP images confirms the above findings.  IMPRESSION: 1. Occlusion of the intracranial right ICA. Multiple small collateral vessels in the region of the right ICA terminus is indicative of chronic occlusion. Right MCA is reconstituted new the MCA bifurcation, with slightly diminished distal flow compared to the left. Right ACA is mainly supplied via the anterior communicating artery. Right ICA is diffusely small in the neck, with moderate stenosis at  its origin. 2. Moderate left carotid siphon atherosclerosis. Mild to moderate proximal right PCA stenosis. Mild to moderate attenuation of distal left PCA branch vessels. 3. 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 4. Dominant right vertebral artery without stenosis identified. 5. Small, acute bilateral infarcts as described on recent MRI.   Electronically Signed   By: Logan Bores   On: 09/05/2013 20:48   Ct Angio Neck W/cm &/or Wo/cm  09/05/2013   CLINICAL  DATA:  Diabetes, hypertension, and prior stroke with left lower extremity abscess and sepsis. Multiple, likely embolic infarcts on MRI. Stroke workup.  EXAM: CT ANGIOGRAPHY HEAD AND NECK  TECHNIQUE: Multidetector CT imaging of the head and neck was performed using the standard protocol during bolus administration of intravenous contrast. Multiplanar CT image reconstructions and MIPs were obtained to evaluate the vascular anatomy. Carotid stenosis measurements (when applicable) are obtained utilizing NASCET criteria, using the distal internal carotid diameter as the denominator.  CONTRAST:  44m OMNIPAQUE IOHEXOL 350 MG/ML SOLN  COMPARISON:  Brain MRI 09/03/2013  FINDINGS: CTA HEAD FINDINGS  Small foci of hypoattenuation are seen in the anterior body and genu of the corpus callosum, right globus pallidus, and left posterior left temporal periventricular white matter corresponding to acute infarcts described on recent MRI additional, punctate acute infarcts on the MRI are not well demonstrated on the CT. Remote infarcts are noted in both cerebellar hemispheres and in the left basal ganglia/ corona radiata. There is no evidence of acute large territory cortical infarct, intracranial hemorrhage, midline shift, or extra-axial fluid collection. There is mild ex vacuo dilatation of the body of the left lateral ventricle. No abnormal enhancement is identified. Orbits are unremarkable. Mastoid air cells and  paranasal sinuses are clear.  The intracranial right vertebral artery is patent, dominant, and supplies the basilar. The left intracranial vertebral artery is markedly small in caliber and only intermittently patent. PICA origins appear patent. AICA and SCA origins are patent. Basilar artery is mildly irregular but without stenosis. There is a focal, mild-to-moderate distal right P1 stenosis. There is irregular, mild to moderate attenuation of left P2 and P3 branches. Posterior communicating arteries are not clearly identified.  The proximal right intracranial ICA is diffusely small in caliber with a severe stenosis at the petrous-cavernous junction. The right ICA becomes occluded at the anterior cavernous segment. There is minimal to no reconstitution of the right intracranial ICA distal to this. Multiple collateral vessels are seen in the expected region of the right ICA terminus. These collateral vessels reconstitute the right MCA near the MCA bifurcation. Major right M2 branches appear patent although mildly smaller in caliber than those on the left. A small amount of contrast is present in the right A1 segment, with the A2 being supplied predominantly by the left ACA via the anterior communicating artery, which appears somewhat bulbous.  The left internal carotid artery is patent from skullbase to carotid terminus. There is mild calcification and moderate irregularity of the left carotid siphon. There is mild narrowing of the left ICA at the petrous-cavernous junction. There is diffuse irregularity with mild to moderate tandem stenoses involving the anterior genu of the left cavernous carotid as well as the supraclinoid left ICA. There is mild stenosis of the proximal left A1 segment. There is also mild proximal left M1 stenosis. There is mild, diffuse irregularity of left M2 and more distal branches.  Review of the MIP images confirms the above findings.  CTA NECK FINDINGS  Three vessel aortic arch. Subclavian  arteries are grossly unremarkable. Evaluation of the proximal common carotid arteries is limited by image noise due to shoulder attenuation as well as mild motion artifact. No mid or distal common carotid artery stenosis is seen. There is moderate focal narrowing of the right internal carotid artery at its origin. The right ICA obtains a more normal caliber for approximately 1 cm beyond the stenosis before becoming narrowed again and demonstrating a diffusely narrowed but smooth luminal contour for  the remainder of its course in the neck. The right ICA lumen near its origin at the site of proximal stenosis measures approximately 2.3 mm in diameter, with the diffusely narrowed mid to distal right cervical ICA measuring approximately 2.7 mm in diameter. The left cervical ICA is mildly tortuous but patent and without stenosis.  The proximal right vertebral artery is grossly patent but suboptimally evaluated due to artifact. The V2 and V3 segments of the right vertebral artery are normal in appearance. The left vertebral artery appears occluded at its origin with reconstitution at the C4 level, with the lumen of the vertebral artery being markedly small in caliber distal to this.  A left subclavian approach central venous catheter is partially visualized. Jugulodigastric lymph nodes measure up to 10 mm in short axis.  Review of the MIP images confirms the above findings.  IMPRESSION: 1. Occlusion of the intracranial right ICA. Multiple small collateral vessels in the region of the right ICA terminus is indicative of chronic occlusion. Right MCA is reconstituted new the MCA bifurcation, with slightly diminished distal flow compared to the left. Right ACA is mainly supplied via the anterior communicating artery. Right ICA is diffusely small in the neck, with moderate stenosis at its origin. 2. Moderate left carotid siphon atherosclerosis. Mild to moderate proximal right PCA stenosis. Mild to moderate attenuation of distal  left PCA branch vessels. 3. 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 4. Dominant right vertebral artery without stenosis identified. 5. Small, acute bilateral infarcts as described on recent MRI.   Electronically Signed   By: Logan Bores   On: 09/05/2013 20:48   Mr Brain Wo Contrast  09/03/2013   CLINICAL DATA:  Change in level of consciousness. Rule out stroke. Thigh abscess with sepsis.  EXAM: MRI HEAD WITHOUT CONTRAST  TECHNIQUE: Multiplanar, multiecho pulse sequences of the brain and surrounding structures were obtained without intravenous contrast.  COMPARISON:  None.  FINDINGS: Images are mildly to moderately degraded by motion. There are numerous punctate foci of restricted diffusion involving cortex and white matter of both cerebral hemispheres consistent with acute infarcts. Several slightly larger foci of acute infarction are present in the corpus callosum measuring up to approximately 1.3 cm in size. Small, acute infarcts are also present in the right greater than left basal ganglia, and there is a punctate acute infarct anteriorly in the right aspect of the medulla. There may be a small amount of hemorrhage associated with a few of these punctate infarcts. Old infarcts are present in the bilateral basal ganglia and bilateral cerebellum.  There is no mass, midline shift, or extra-axial fluid collection. There is mild cerebral and cerebellar atrophy. Orbits are unremarkable. Minimal ethmoid air cell mucosal thickening is noted. Mastoid air cells are clear.  IMPRESSION: 1. Multiple small infarcts involving both cerebral hemispheres, corpus callosum, and medulla, consistent with embolic infarcts. These could be bland or septic emboli given history. 2. 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   Electronically Signed   By: Logan Bores   On: 09/03/2013 17:38   Dg Chest Port 1 View  09/05/2013   CLINICAL DATA:  Left-sided PICC placement.   EXAM: PORTABLE CHEST - 1 VIEW  COMPARISON:  Chest x-ray 08/31/2013.  FINDINGS: There is a left upper extremity PICC with tip terminating in the superior aspect of the right atrium (approximately 3 cm beyond the superior cavoatrial junction). No pneumothorax. No acute consolidative airspace disease. No pleural effusions. No evidence  of pulmonary edema. Mild cardiomegaly. Upper mediastinal contours are within normal limits.  IMPRESSION: 1. Tip of PICC is in the right atrium approximately 3 cm distal to the superior cavoatrial junction. 2. Status post extubation with significantly improved aeration. 3. Mild cardiomegaly.   Electronically Signed   By: Vinnie Langton M.D.   On: 09/05/2013 14:21   Portable Chest Xray In Am  08/31/2013   CLINICAL DATA:  Respiratory failure.  EXAM: PORTABLE CHEST - 1 VIEW  COMPARISON:  08/30/2013.  FINDINGS: The endotracheal tube is 4 cm above the carina. The NG tube is coursing down the esophagus and into the stomach. The heart is enlarged but unchanged. Persistent mild perihilar edema and probable small effusions.  IMPRESSION: Stable support apparatus.  Persistent perihilar edema and areas of atelectasis.   Electronically Signed   By: Kalman Jewels M.D.   On: 08/31/2013 07:29   Dg Chest Port 1 View  08/30/2013   CLINICAL DATA:  Endotracheal tube placement.  EXAM: PORTABLE CHEST - 1 VIEW  COMPARISON:  08/30/2013 and 02/21/2013.  FINDINGS: 2214 hr. Endotracheal tube appears unchanged, within the mid trachea. There is improved aeration of the lungs with residual patchy left greater than right basilar airspace opacities. There is no pleural effusion or pneumothorax. The heart size and mediastinal contours are stable.  IMPRESSION: Interval improved aeration of the lung bases. Endotracheal tube appears unchanged within the midtrachea.   Electronically Signed   By: Camie Patience M.D.   On: 08/30/2013 23:16   Portable Chest Xray  08/30/2013   CLINICAL DATA:  Endotracheal tube  placement  EXAM: PORTABLE CHEST - 1 VIEW  COMPARISON:  Chest radiograph 02/21/2013  FINDINGS: Markedly low lung volumes limit evaluation. The endotracheal tube terminates within the distal trachea approximately 5 mm superior to the carina. Cardiomegaly. Heterogeneous lower lung opacities, left greater than right. No pleural effusion or pneumothorax.  IMPRESSION: Endotracheal tube terminates within the distal trachea. Recommend retraction and re-evaluation with repeat radiograph.  Scattered heterogeneous opacities favored to represent atelectasis in the setting of markedly low lung volumes.  Discussed with Nurse Reece Leader at 8:43 p.m. on 08/30/2013.   Electronically Signed   By: Lovey Newcomer M.D.   On: 08/30/2013 20:44   Dg Abd Portable 1v  08/31/2013   CLINICAL DATA:  Enteric tube placement.  EXAM: PORTABLE ABDOMEN - 1 VIEW  COMPARISON:  Lumbar spine radiographs performed 09/26/2011  FINDINGS: The patient's enteric tube is seen ending at the body of the stomach, with the sideport at the fundus of the stomach.  The visualized bowel gas pattern is grossly unremarkable, with scattered air noted in the colon. No free intra-abdominal air is identified, though evaluation for free air is limited on a single supine view and due to motion artifact. No acute osseous abnormalities are seen.  IMPRESSION: Enteric tube is seen ending at the body of the stomach, with the sideport at the fundus of the stomach.   Electronically Signed   By: Garald Balding M.D.   On: 08/31/2013 05:45      Microbiology: Recent Results (from the past 240 hour(s))  CLOSTRIDIUM DIFFICILE BY PCR     Status: None   Collection Time    09/17/13  2:46 AM      Result Value Ref Range Status   C difficile by pcr NEGATIVE  NEGATIVE Final  URINE CULTURE     Status: None   Collection Time    09/17/13  2:48 AM  Result Value Ref Range Status   Specimen Description URINE, CATHETERIZED   Final   Special Requests NONE   Final   Culture   Setup Time     Final   Value: 09/17/2013 09:30     Performed at [place]     Final   Value: 45,000 COLONIES/ML     Performed at [facility]   Culture     Final   Value: Multiple bacterial morphotypes present, none predominant. Suggest appropriate recollection if clinically indicated.     Performed at [facility]   Report Status 09/18/2013 FINAL   Final     Labs: Results for orders placed during the hospital encounter of 09/16/13 (from the past 48 hour(s))  GLUCOSE, CAPILLARY     Status: None   Collection Time    09/18/13  4:56 PM      Result Value Ref Range   Glucose-Capillary 98  70 - 99 mg/dL  GLUCOSE, CAPILLARY     Status: None   Collection Time    09/18/13 10:15 PM      Result Value Ref Range   Glucose-Capillary 96  70 - 99 mg/dL  BASIC METABOLIC PANEL     Status: Abnormal   Collection Time    09/19/13  5:05 AM      Result Value Ref Range   Sodium 139  137 - 147 mEq/L   Potassium 4.4  3.7 - 5.3 mEq/L   Chloride 105  96 - 112 mEq/L   CO2 22  19 - 32 mEq/L   Glucose, Bld 87  70 - 99 mg/dL   BUN 16  6 - 23 mg/dL   Creatinine, Ser 1.21 (*) 0.50 - 1.10 mg/dL   Calcium 8.1 (*) 8.4 - 10.5 mg/dL   GFR calc non Af Amer 57 (*) >90 mL/min   GFR calc Af Amer 66 (*) >90 mL/min   Comment: (NOTE)     The eGFR has been calculated using the CKD EPI equation.     This calculation has not been validated in all clinical situations.     eGFR's persistently <90 mL/min signify possible Chronic Kidney     Disease.   Anion gap 12  5 - 15  CBC     Status: Abnormal   Collection Time    09/19/13  5:05 AM      Result Value Ref Range   WBC 7.1  4.0 - 10.5 K/uL   RBC 2.80 (*) 3.87 - 5.11 MIL/uL   Hemoglobin 7.4 (*) 12.0 - 15.0 g/dL   HCT 23.1 (*) 36.0 - 46.0 %   MCV 82.5  78.0 - 100.0 fL   MCH 26.4  26.0 - 34.0 pg   MCHC 32.0  30.0 - 36.0 g/dL   RDW 16.1 (*) 11.5 - 15.5 %   Platelets 368  150 - 400 K/uL  LACTATE DEHYDROGENASE     Status:  Abnormal   Collection Time    09/19/13  5:05 AM      Result Value Ref Range   LDH 272 (*) 94 - 250 U/L   Comment: HEMOLYSIS AT THIS LEVEL MAY AFFECT RESULT  GLUCOSE, CAPILLARY     Status: None   Collection Time    09/19/13  6:46 AM      Result Value Ref Range   Glucose-Capillary 80  70 - 99 mg/dL  PREPARE RBC (CROSSMATCH)     Status: None   Collection Time  09/19/13  9:55 AM      Result Value Ref Range   Order Confirmation ORDER PROCESSED BY BLOOD BANK    VITAMIN B12     Status: None   Collection Time    09/19/13 10:36 AM      Result Value Ref Range   Vitamin B-12 498  211 - 911 pg/mL   Comment: Performed at [facility]     Status: None   Collection Time    09/19/13 10:36 AM      Result Value Ref Range   Folate 9.4     Comment: (NOTE)     Reference Ranges            Deficient:       0.4 - 3.3 ng/mL            Indeterminate:   3.4 - 5.4 ng/mL            Normal:              > 5.4 ng/mL     Performed at [facility]  IRON AND TIBC     Status: Abnormal   Collection Time    09/19/13 10:36 AM      Result Value Ref Range   Iron 19 (*) 42 - 135 ug/dL   TIBC 154 (*) 250 - 470 ug/dL   Saturation Ratios 12 (*) 20 - 55 %   UIBC 135  125 - 400 ug/dL   Comment: Performed at [facility]     Status: Abnormal   Collection Time    09/19/13 10:36 AM      Result Value Ref Range   Ferritin 595 (*) 10 - 291 ng/mL   Comment: Performed at [facility]  RETICULOCYTES     Status: Abnormal   Collection Time    09/19/13 10:36 AM      Result Value Ref Range   Retic Ct Pct 2.0  0.4 - 3.1 %   RBC. 3.21 (*) 3.87 - 5.11 MIL/uL   Retic Count, Manual 64.2  19.0 - 186.0 K/uL  GLUCOSE, CAPILLARY     Status: Abnormal   Collection Time    09/19/13 11:04 AM      Result Value Ref Range   Glucose-Capillary 127 (*) 70 - 99 mg/dL  GLUCOSE, CAPILLARY     Status: Abnormal   Collection Time    09/19/13  4:59 PM      Result Value Ref Range    Glucose-Capillary 177 (*) 70 - 99 mg/dL  GLUCOSE, CAPILLARY     Status: Abnormal   Collection Time    09/19/13 10:20 PM      Result Value Ref Range   Glucose-Capillary 189 (*) 70 - 99 mg/dL  CBC     Status: Abnormal   Collection Time    09/20/13  5:26 AM      Result Value Ref Range   WBC 7.8  4.0 - 10.5 K/uL   RBC 3.00 (*) 3.87 - 5.11 MIL/uL   Hemoglobin 7.9 (*) 12.0 - 15.0 g/dL   HCT 24.6 (*) 36.0 - 46.0 %   MCV 82.0  78.0 - 100.0 fL   MCH 26.3  26.0 - 34.0 pg   MCHC 32.1  30.0 - 36.0 g/dL   RDW 15.9 (*) 11.5 - 15.5 %   Platelets 360  150 - 400 K/uL  COMPREHENSIVE METABOLIC PANEL     Status: Abnormal  Collection Time    09/20/13  5:26 AM      Result Value Ref Range   Sodium 140  137 - 147 mEq/L   Potassium 3.9  3.7 - 5.3 mEq/L   Chloride 106  96 - 112 mEq/L   CO2 22  19 - 32 mEq/L   Glucose, Bld 130 (*) 70 - 99 mg/dL   BUN 14  6 - 23 mg/dL   Creatinine, Ser 1.12 (*) 0.50 - 1.10 mg/dL   Calcium 8.0 (*) 8.4 - 10.5 mg/dL   Total Protein 5.9 (*) 6.0 - 8.3 g/dL   Albumin 1.4 (*) 3.5 - 5.2 g/dL   AST 9  0 - 37 U/L   ALT 8  0 - 35 U/L   Alkaline Phosphatase 49  39 - 117 U/L   Total Bilirubin <0.2 (*) 0.3 - 1.2 mg/dL   GFR calc non Af Amer 62 (*) >90 mL/min   GFR calc Af Amer 72 (*) >90 mL/min   Comment: (NOTE)     The eGFR has been calculated using the CKD EPI equation.     This calculation has not been validated in all clinical situations.     eGFR's persistently <90 mL/min signify possible Chronic Kidney     Disease.   Anion gap 12  5 - 15  GLUCOSE, CAPILLARY     Status: Abnormal   Collection Time    09/20/13  6:34 AM      Result Value Ref Range   Glucose-Capillary 128 (*) 70 - 99 mg/dL     HPI  [name] is a 38 y.o. female with a Past Medical History of recent left eye abscess status post incision and drainage, recent sepsis secondary to left thigh abscess, embolic CVA, diabetes, hypertriglyceridemia who presents today with the above noted complaint. Patient  was just discharged from this facility a few days back and transferred to a skilled nursing facility for further care. For the past 2 days, patient has noted bleeding from her left thigh wound. She was taken to [facility], and transferred to [facility] for further evaluation and treatment. In the emergency room, she was evaluated by Dr. Grandville Silos from [place] [facility] controlled this meeting with a hand-held cautery and silver nitrate. Since the patient was just discharged from the hospitalist service, I was asked to admit this patient for further observation.  Please note, patient has a Foley catheter and a flexiseal in place, per her these have been in place since her last admission.  Patient denies any fever, chest pain, shortness of breath, nausea, vomiting. Review of last discharge summary, indicates diarrhea as an issue for which she has a flexiseal, however C Diff PCR was negative.  Please note, no labs hematocrit noted for today, patient had labs at [facility] which showed a creatinine of 1.79, WBC of 6.5, hemoglobin of 8.6 and a platelet count of 433,000. Rest of her labs were generally within normal limits.   HOSPITAL COURSE:   Bleeding from wound  General surgery used hand-held cautery and silver nitrate to control bleeding. Dressing is in place, it is currently dry and no soakage is seen.  Surgery recommends to Continue with BID wet to dry dressing changes Hemoglobin still low but no active bleeding as per surgery  Culture no growth so far  Prior admission cultures, anaerobic culture was positive for bacteroids- therefore treated with Unasyn.  Patient is being discharged on Augmentin and doxycycline for  another 10 days Patient to followup with Gen. surgery Dr. Rosendo Gros as previously scheduled  Urinary tract infection  Urine culture no growth so far Patient had an indwelling Foley for about a week which is being removed prior to  discharge  Severe protein calorie malnutrition  Nutrition consultation obtained yesterday were poor oral intake and calorie counting  I suspect that the patient depressed and not motivated to get better   Anemia  Hemoglobin was 8.1 on the day of admission Subsequently dropped to 7.1 could be related to menstrual bleeding/anemia of chronic disease  Anemia panel reflected iron deficiency, patient started on iron supplementation Status post transfusion of 3 units of packed red blood cells Hemoglobin-increased to 7.9 Please check CBC weekly in the outpatient setting  Hypertension Patient takes Maxzide at home This was held because of increase in her creatinine Can resume if creatinine has been stable In the interim the patient has been started on Norvasc  . Diabetes mellitus with hyperglycemia  Lantus adjusted because of poor oral intake  .   Marland Kitchen Cerebral embolism with cerebral infarction  Aspirin Has been held during this admission, if no further bleeding, PCP to resume aspirin if CBC stable . Currently without any focal deficits.     Marland Kitchen AKI (acute kidney injury) -prerenal Baseline creatinine of 1.2, creatinine at the time of admission peaked at 1.64 Diuretics held Creatinine improved to 1.12 with IV hydration Please DC foley if the wound is healing well ,  . Hypertriglyceridemia  - Continue gemfibrozil. Repeat lipid panel in the next 3-6 months at PCP office.   . Diarrhea  - Suspect this to be a chronic issue-patient presented to the ED with a flexiseal in place  This was discontinued prior to discharge C. difficile PCR negative Continue probiotics      Discharge Exam:   Blood pressure 152/74, pulse 93, temperature 99.2 F (37.3 C), temperature source Oral, resp. rate 16, height _0  (1.6 m), weight 136.079 kg (300 lb), last menstrual period 08/11/2013, SpO2 100.00%, not currently breastfeeding. General: alert & oriented x 3 In NAD  Cardiovascular: RRR, nl S1 s2    Respiratory: Decreased breath sounds at the bases, scattered rhonchi, no crackles  Abdomen: soft +BS NT/ND, no masses palpable  Extremities: No cyanosis and no edema         Discharge Instructions   Diet - low sodium heart healthy    Complete by:  As directed      Increase activity slowly    Complete by:  As directed            Follow-up Information   Follow up with GARBA,LAWAL, MD. Schedule an appointment as soon as possible for a visit in 1 week.   Specialty:  Internal Medicine   Contact information:   [address] [place] [address] [phone]     Follow-up Information  Follow up with Xu,Jindong, MD. Schedule an appointment as soon as possible for a visit in 2 months. (Stroke Clinic)  Specialty: Neurology  Contact information:  [address] [place] [address]  [phone]  Follow up with Rosario Jacks., Anne Hahn, MD In 2 weeks. (For post-operation check. Please call the office to verify your appointment time and date in 2-3 weeks.)  Specialty: General Surgery  Contact information:  [address] [place] [address]  [phone]  Follow up with Barbette Merino, MD. Schedule an appointment as soon as possible for a visit in 1 week. [facility] followup)  Specialty: Internal Medicine  Contact information:  [address]  [place] [address]  [phone]    Signed: Reyne Dumas 09/20/2013, 10:45 AM

## 2013-09-20 NOTE — Care Management Note (Signed)
CARE MANAGEMENT NOTE 09/20/2013  Patient:  [name],[name]   Account Number:  [id number]  Date Initiated:  09/20/2013  Documentation initiated by:  Vance PeperBRADY,Neeka Urista  Subjective/Objective Assessment:   38 yr old female admitted with bleeding abscess, s/p I & D.     Action/Plan:   09/17/13  Case manager and social worker spoke with patient concerning need for SNF versus Home Health.   Anticipated DC Date:  09/20/2013   Anticipated DC Plan:  HOME W HOME HEALTH SERVICES  In-house referral  Clinical Social Worker      DC Planning Services  CM consult      PAC Choice  DURABLE MEDICAL EQUIPMENT  HOME HEALTH   Choice offered to / List presented to:  C-1 Patient   DME arranged  [facility]      DME agency  [facility]     HH arranged  HH-1 RN  HH-2 PT  HH-3 OT  HH-6 SOCIAL WORKER      HH agency  [facility]   Status of service:  Completed, signed off Medicare Important Message given?   (If response is "NO", the following Medicare IM given date fields will be blank) Date Medicare IM given:   Medicare IM given by:   Date Additional Medicare IM given:   Additional Medicare IM given by:    Discharge Disposition:  AGAINST MEDICAL ADVICE  Per UR Regulation:  Reviewed for med. necessity/level of care/duration of stay

## 2013-09-23 ENCOUNTER — Encounter (INDEPENDENT_AMBULATORY_CARE_PROVIDER_SITE_OTHER): Payer: Medicaid Other | Admitting: General Surgery

## 2013-09-23 ENCOUNTER — Telehealth (INDEPENDENT_AMBULATORY_CARE_PROVIDER_SITE_OTHER): Payer: Self-pay

## 2013-09-23 NOTE — Telephone Encounter (Signed)
[facility]  Re:  scheduling to change patient post op appointment time to 10/08/13 @ 2:30pm.  Patient recently d/c from hospital on 09/16/2013, follow up appointment moved out to 10/08/13 @ 2:30pm w/Dr. Derrell Lollingamirez for wound check

## 2013-10-07 ENCOUNTER — Encounter (INDEPENDENT_AMBULATORY_CARE_PROVIDER_SITE_OTHER): Payer: Medicaid Other | Admitting: General Surgery

## 2013-10-08 ENCOUNTER — Ambulatory Visit (INDEPENDENT_AMBULATORY_CARE_PROVIDER_SITE_OTHER): Payer: Medicaid Other | Admitting: General Surgery

## 2013-10-08 ENCOUNTER — Encounter (INDEPENDENT_AMBULATORY_CARE_PROVIDER_SITE_OTHER): Payer: Self-pay | Admitting: General Surgery

## 2013-10-08 VITALS — BP 116/80 | HR 95 | Temp 98.7°F | Ht 63.0 in | Wt 272.0 lb

## 2013-10-08 DIAGNOSIS — Z9889 Other specified postprocedural states: Secondary | ICD-10-CM

## 2013-10-08 NOTE — Progress Notes (Signed)
Patient ID: [name], female   DOB: 03-29-1975, 38 y.o.   MRN: [id number] Post op course The patient is a 38 year old female status post left thigh excision for necrotizing fasciitis. Patient is currently in rehabilitation. Patient hasn't been undergoing twice a day dressing changes.    On Exam: Left groin wound beefy-red, no purulence, no erythema, shallow   Assessment and Plan 38 year old female status post excision of necrotic fasciitis to left thigh. 1. Patient to continue with twice a day dressing changes 2. Patient follow up in 3-4 weeks 3. Patient follow up with her medical physician for Foley removal.   [name], MD [facility] General & Minimally Invasive Surgery Trauma & Emergency Surgery

## 2013-10-26 ENCOUNTER — Encounter (HOSPITAL_COMMUNITY): Payer: Self-pay | Admitting: Emergency Medicine

## 2013-10-26 ENCOUNTER — Emergency Department (HOSPITAL_COMMUNITY)
Admission: EM | Admit: 2013-10-26 | Discharge: 2013-10-26 | Disposition: A | Discharge status: Home / Self Care | Payer: Medicaid Other | Attending: Emergency Medicine | Admitting: Emergency Medicine

## 2013-10-26 ENCOUNTER — Emergency Department (HOSPITAL_COMMUNITY): Payer: Medicaid Other

## 2013-10-26 DIAGNOSIS — Z7982 Long term (current) use of aspirin: Secondary | ICD-10-CM | POA: Insufficient documentation

## 2013-10-26 DIAGNOSIS — R000 Tachycardia, unspecified: Secondary | ICD-10-CM | POA: Diagnosis not present

## 2013-10-26 DIAGNOSIS — I10 Essential (primary) hypertension: Secondary | ICD-10-CM | POA: Diagnosis not present

## 2013-10-26 DIAGNOSIS — Z794 Long term (current) use of insulin: Secondary | ICD-10-CM | POA: Diagnosis not present

## 2013-10-26 DIAGNOSIS — Z8719 Personal history of other diseases of the digestive system: Secondary | ICD-10-CM | POA: Insufficient documentation

## 2013-10-26 DIAGNOSIS — R509 Fever, unspecified: Secondary | ICD-10-CM | POA: Insufficient documentation

## 2013-10-26 DIAGNOSIS — Z8673 Personal history of transient ischemic attack (TIA), and cerebral infarction without residual deficits: Secondary | ICD-10-CM | POA: Diagnosis not present

## 2013-10-26 DIAGNOSIS — N390 Urinary tract infection, site not specified: Secondary | ICD-10-CM | POA: Diagnosis not present

## 2013-10-26 DIAGNOSIS — Z79899 Other long term (current) drug therapy: Secondary | ICD-10-CM | POA: Insufficient documentation

## 2013-10-26 DIAGNOSIS — J45909 Unspecified asthma, uncomplicated: Secondary | ICD-10-CM | POA: Diagnosis not present

## 2013-10-26 DIAGNOSIS — E119 Type 2 diabetes mellitus without complications: Secondary | ICD-10-CM | POA: Insufficient documentation

## 2013-10-26 LAB — CBC WITH DIFFERENTIAL/PLATELET
BASOS PCT: 0 % (ref 0–1)
Basophils Absolute: 0 10*3/uL (ref 0.0–0.1)
Eosinophils Absolute: 0.3 10*3/uL (ref 0.0–0.7)
Eosinophils Relative: 4 % (ref 0–5)
HEMATOCRIT: 29.7 % — AB (ref 36.0–46.0)
Hemoglobin: 9.7 g/dL — ABNORMAL LOW (ref 12.0–15.0)
LYMPHS PCT: 38 % (ref 12–46)
Lymphs Abs: 2.6 10*3/uL (ref 0.7–4.0)
MCH: 26.9 pg (ref 26.0–34.0)
MCHC: 32.7 g/dL (ref 30.0–36.0)
MCV: 82.5 fL (ref 78.0–100.0)
MONO ABS: 0.4 10*3/uL (ref 0.1–1.0)
MONOS PCT: 6 % (ref 3–12)
Neutro Abs: 3.5 10*3/uL (ref 1.7–7.7)
Neutrophils Relative %: 52 % (ref 43–77)
Platelets: 297 10*3/uL (ref 150–400)
RBC: 3.6 MIL/uL — AB (ref 3.87–5.11)
RDW: 15.7 % — ABNORMAL HIGH (ref 11.5–15.5)
WBC: 6.8 10*3/uL (ref 4.0–10.5)

## 2013-10-26 LAB — I-STAT VENOUS BLOOD GAS, ED
Acid-base deficit: 2 mmol/L (ref 0.0–2.0)
BICARBONATE: 21.9 meq/L (ref 20.0–24.0)
O2 SAT: 64 %
PH VEN: 7.41 — AB (ref 7.250–7.300)
TCO2: 23 mmol/L (ref 0–100)
pCO2, Ven: 34.6 mmHg — ABNORMAL LOW (ref 45.0–50.0)
pO2, Ven: 32 mmHg (ref 30.0–45.0)

## 2013-10-26 LAB — COMPREHENSIVE METABOLIC PANEL
ALBUMIN: 2.7 g/dL — AB (ref 3.5–5.2)
ALK PHOS: 71 U/L (ref 39–117)
ALT: 8 U/L (ref 0–35)
AST: 10 U/L (ref 0–37)
Anion gap: 15 (ref 5–15)
BUN: 11 mg/dL (ref 6–23)
CHLORIDE: 101 meq/L (ref 96–112)
CO2: 21 meq/L (ref 19–32)
CREATININE: 0.76 mg/dL (ref 0.50–1.10)
Calcium: 9.1 mg/dL (ref 8.4–10.5)
GFR calc non Af Amer: >90 mL/min (ref >90)
GLUCOSE: 328 mg/dL — AB (ref 70–99)
POTASSIUM: 3.9 meq/L (ref 3.7–5.3)
Sodium: 137 mEq/L (ref 137–147)
Total Protein: 7 g/dL (ref 6.0–8.3)

## 2013-10-26 LAB — I-STAT CG4 LACTIC ACID, ED: Lactic Acid, Venous: 1.06 mmol/L (ref 0.5–2.2)

## 2013-10-26 LAB — URINALYSIS, ROUTINE W REFLEX MICROSCOPIC
BILIRUBIN URINE: NEGATIVE
Glucose, UA: 100 mg/dL — AB
Ketones, ur: NEGATIVE mg/dL
Nitrite: NEGATIVE
PH: 6.5 (ref 5.0–8.0)
Protein, ur: 100 mg/dL — AB
Specific Gravity, Urine: 1.013 (ref 1.005–1.030)
Urobilinogen, UA: 0.2 mg/dL (ref 0.0–1.0)

## 2013-10-26 LAB — CBG MONITORING, ED: Glucose-Capillary: 294 mg/dL — ABNORMAL HIGH (ref 70–99)

## 2013-10-26 LAB — URINE MICROSCOPIC-ADD ON

## 2013-10-26 MED ORDER — AMLODIPINE BESYLATE 5 MG PO TABS: 5.0000 mg | ORAL_TABLET | Freq: Once | ORAL | Status: DC

## 2013-10-26 MED ORDER — CEPHALEXIN 500 MG PO CAPS: 500.0000 mg | ORAL_CAPSULE | Freq: Four times a day (QID) | ORAL | Status: DC

## 2013-10-26 MED ORDER — AMLODIPINE BESYLATE 5 MG PO TABS
10.0000 mg | ORAL_TABLET | Freq: Once | ORAL | Status: AC
  Administered 2013-10-26: 10 mg via ORAL
  Filled 2013-10-26: qty 2

## 2013-10-26 MED ORDER — DEXTROSE 5 % IV SOLN
1.0000 g | Freq: Once | INTRAVENOUS | Status: AC
  Administered 2013-10-26: 1 g via INTRAVENOUS
  Filled 2013-10-26: qty 10

## 2013-10-26 MED ORDER — METOPROLOL TARTRATE 1 MG/ML IV SOLN
5.0000 mg | Freq: Once | INTRAVENOUS | Status: AC
  Administered 2013-10-26: 5 mg via INTRAVENOUS
  Filled 2013-10-26: qty 5

## 2013-10-26 MED ORDER — SODIUM CHLORIDE 0.9 % IV BOLUS (SEPSIS)
1000.0000 mL | Freq: Once | INTRAVENOUS | Status: AC
  Administered 2013-10-26: 1000 mL via INTRAVENOUS

## 2013-10-26 MED ORDER — LABETALOL HCL 5 MG/ML IV SOLN
20.0000 mg | Freq: Once | INTRAVENOUS | Status: AC
  Administered 2013-10-26: 20 mg via INTRAVENOUS
  Filled 2013-10-26: qty 4

## 2013-10-26 NOTE — ED Notes (Signed)
Allora Bains, PA at the bedside.  

## 2013-10-26 NOTE — ED Notes (Signed)
Attempted foley catheter. Unable to place.

## 2013-10-26 NOTE — ED Notes (Signed)
Patient is back from Xray. Pt placed back on the monitor. Patient comfortable.

## 2013-10-26 NOTE — Discharge Instructions (Signed)
1. Medications: Keflex, usual home medications including your blood pressure medications 2. Treatment: rest, drink plenty of fluids,  3. Follow Up: Please followup with your primary doctor tomorrow at your appointment for discussion of your diagnoses and further evaluation after today's visit; return to emergency Department for chest pain, shortness of breath or headache  Urinary Tract Infection Urinary tract infections (UTIs) can develop anywhere along your urinary tract. Your urinary tract is your body's drainage system for removing wastes and extra water. Your urinary tract includes two kidneys, two ureters, a bladder, and a urethra. Your kidneys are a pair of bean-shaped organs. Each kidney is about the size of your fist. They are located below your ribs, one on each side of your spine. CAUSES Infections are caused by microbes, which are microscopic organisms, including fungi, viruses, and bacteria. These organisms are so small that they can only be seen through a microscope. Bacteria are the microbes that most commonly cause UTIs. SYMPTOMS  Symptoms of UTIs may vary by age and gender of the patient and by the location of the infection. Symptoms in young women typically include a frequent and intense urge to urinate and a painful, burning feeling in the bladder or urethra during urination. Older women and men are more likely to be tired, shaky, and weak and have muscle aches and abdominal pain. A fever may mean the infection is in your kidneys. Other symptoms of a kidney infection include pain in your back or sides below the ribs, nausea, and vomiting. DIAGNOSIS To diagnose a UTI, your caregiver will ask you about your symptoms. Your caregiver also will ask to provide a urine sample. The urine sample will be tested for bacteria and white blood cells. White blood cells are made by your body to help fight infection. TREATMENT  Typically, UTIs can be treated with medication. Because most UTIs are  caused by a bacterial infection, they usually can be treated with the use of antibiotics. The choice of antibiotic and length of treatment depend on your symptoms and the type of bacteria causing your infection. HOME CARE INSTRUCTIONS  If you were prescribed antibiotics, take them exactly as your caregiver instructs you. Finish the medication even if you feel better after you have only taken some of the medication.  Drink enough water and fluids to keep your urine clear or pale yellow.  Avoid caffeine, tea, and carbonated beverages. They tend to irritate your bladder.  Empty your bladder often. Avoid holding urine for long periods of time.  Empty your bladder before and after sexual intercourse.  After a bowel movement, women should cleanse from front to back. Use each tissue only once. SEEK MEDICAL CARE IF:   You have back pain.  You develop a fever.  Your symptoms do not begin to resolve within 3 days. SEEK IMMEDIATE MEDICAL CARE IF:   You have severe back pain or lower abdominal pain.  You develop chills.  You have nausea or vomiting.  You have continued burning or discomfort with urination. MAKE SURE YOU:   Understand these instructions.  Will watch your condition.  Will get help right away if you are not doing well or get worse. Document Released: 11/07/2004 Document Revised: 07/30/2011 Document Reviewed: 03/08/2011 [facility] Patient Information 2015 [place], [place]. This information is not intended to replace advice given to you by your health care provider. Make sure you discuss any questions you have with your health care provider.

## 2013-10-26 NOTE — ED Provider Notes (Signed)
CSN: [id number]     Arrival date & time 10/26/13  1516 History   First MD Initiated Contact with Patient 10/26/13 1645     Chief Complaint  Patient presents with  . Hypertension  . Fever     (Consider location/radiation/quality/duration/timing/severity/associated sxs/prior Treatment) The history is provided by the patient and medical records. No language interpreter was used.    [name] is a 38 y.o. female  with a hx of hypertension, CVA, asthma, IDDM, GERD, morbid obesity presents to the Emergency Department complaining of gradual, persistent, progressively worsening fevers and hypertension onset yesterday.  Pt reports her foley has not been changed in 2 mos.  Pt reports home health was out today to perform wound care, but she was found to be febrile to 101 per home health RN and was sent to her primary care. She presented to the PCP office today, was unable to see her PCP, but did have her BP medication refilled and they are waiting for her at the pharmacy.  Associated symptoms include irritation at the foley site.  Nothing makes it better and nothing makes it worse.  Pt denies chills, headache, neck pain, chest pain, SOB, abd pain, N/V/D, weakness, dizziness, syncope.  Pt reports she ran out of her HTN meds 2 days ago with increasing BP since that time.    Record review shows the patient was admitted to the hospital on 08/30/2013 for necrotizing fasciitis of the left thigh for which she underwent significant surgery. She was discharged on 09/21/2011 to a rehabilitation facility with urinary catheter in place.  She was d/c home on 10/16/13.  Past Medical History  Diagnosis Date  . Morbid obesity   . Hypertension   . CVA (cerebral infarction) 1990's    "writing is not the same since"  . Asthma   . Type II diabetes mellitus   . GERD (gastroesophageal reflux disease)    Past Surgical History  Procedure Laterality Date  . Incision and drainage of wound Left 08/30/2013    thigh  necrotic wound/notes 08/30/2013  . Irrigation and debridement abscess Left 08/30/2013    Procedure: IRRIGATION AND DEBRIDEMENT ABSCESS Left Thigh Necrotic Wound;  Surgeon: [name], MD;  Location: [facility] OR;  Service: General;  Laterality: Left;  . Tee without cardioversion N/A 09/07/2013    Procedure: TRANSESOPHAGEAL ECHOCARDIOGRAM (TEE);  Surgeon: [name], MD;  Location: [facility] ENDOSCOPY;  Service: Cardiovascular;  Laterality: N/A;   Family History  Problem Relation Age of Onset  . Diabetes Mother   . Kidney disease Mother    History  Substance Use Topics  . Smoking status: Never Smoker   . Smokeless tobacco: Never Used  . Alcohol Use: No   OB History   Grav Para Term Preterm Abortions TAB SAB Ect Mult Living   0 0 0 0 0 0 3     Review of Systems  Constitutional: Positive for fever. Negative for diaphoresis, appetite change, fatigue and unexpected weight change.  HENT: Negative for mouth sores.   Eyes: Negative for visual disturbance.  Respiratory: Negative for cough, chest tightness, shortness of breath and wheezing.   Cardiovascular: Negative for chest pain.  Gastrointestinal: Negative for nausea, vomiting, abdominal pain, diarrhea and constipation.  Endocrine: Negative for polydipsia, polyphagia and polyuria.  Genitourinary: Negative for dysuria, urgency, frequency and hematuria.  Musculoskeletal: Negative for back pain and neck stiffness.  Skin: Negative for rash.  Allergic/Immunologic: Negative for immunocompromised state.  Neurological: Negative for syncope, light-headedness and  headaches.  Hematological: Does not bruise/bleed easily.  Psychiatric/Behavioral: Negative for sleep disturbance. The patient is not nervous/anxious.       Allergies  Azithromycin  Home Medications   Prior to Admission medications   Medication Sig Start Date End Date Taking? Authorizing Provider  acetaminophen (TYLENOL) 325 MG tablet Take 650 mg by mouth every 6 (six) hours  as needed for mild pain, fever or headache. 09/09/13  Yes Maryann Mikhail, DO  albuterol (PROVENTIL HFA;VENTOLIN HFA) 108 (90 BASE) MCG/ACT inhaler Inhale 2 puffs into the lungs every 4 (four) hours as needed for wheezing or shortness of breath. 02/21/13  Yes Arthor Captain, PA-C  Amino Acids-Protein Hydrolys (FEEDING SUPPLEMENT, PRO-STAT SUGAR FREE 64,) LIQD Take 30 mLs by mouth 2 (two) times daily with a meal. 09/09/13  Yes Maryann Mikhail, DO  amLODipine (NORVASC) 10 MG tablet Take 1 tablet (10 mg total) by mouth daily. 09/20/13  Yes Richarda Overlie, MD  arformoterol (BROVANA) 15 MCG/2ML NEBU Take 15 mcg by nebulization every 12 (twelve) hours. 09/09/13  Yes Maryann Mikhail, DO  aspirin EC 325 MG EC tablet Take 1 tablet (325 mg total) by mouth daily. 09/09/13  Yes Maryann Mikhail, DO  atorvastatin (LIPITOR) 20 MG tablet Take 1 tablet (20 mg total) by mouth daily at 6 PM. 09/09/13  Yes Maryann Mikhail, DO  budesonide (PULMICORT) 0.25 MG/2ML nebulizer solution Take 0.25 mg by nebulization every 12 (twelve) hours. 09/09/13  Yes Maryann Mikhail, DO  ferrous sulfate 300 (60 FE) MG/5ML syrup Take 5 mLs (300 mg total) by mouth 2 (two) times daily with a meal. 09/20/13  Yes Richarda Overlie, MD  gemfibrozil (LOPID) 600 MG tablet Take 1 tablet (600 mg total) by mouth 2 (two) times daily before a meal. 09/09/13  Yes Maryann Mikhail, DO  insulin aspart (NOVOLOG) 100 UNIT/ML injection Inject 0-20 Units into the skin 3 (three) times daily with meals. CBG < 70: implement hypoglycemia protocol CBG 70 - 120: 0 units CBG 121 - 150: 3 units CBG 151 - 200: 4 units CBG 201 - 250: 7 units CBG 251 - 300: 11 units CBG 301 - 350: 15 units CBG 351 - 400: 20 units 09/09/13  Yes Maryann Mikhail, DO  insulin glargine (LANTUS) 100 UNIT/ML injection Inject 0.15 mLs (15 Units total) into the skin daily. 09/20/13  Yes Richarda Overlie, MD  Multiple Vitamin (MULTIVITAMIN WITH MINERALS) TABS tablet Take 1 tablet by mouth daily. 09/09/13  Yes Maryann  Mikhail, DO  saccharomyces boulardii (FLORASTOR) 250 MG capsule Take 1 capsule (250 mg total) by mouth 2 (two) times daily. 09/20/13  Yes Richarda Overlie, MD  cephALEXin (KEFLEX) 500 MG capsule Take 1 capsule (500 mg total) by mouth 4 (four) times daily. 10/26/13   Cortina Vultaggio, PA-C   BP 163/101  Pulse 100  Temp(Src) 98.9 F (37.2 C) (Rectal)  Resp 16  SpO2 97%  LMP 10/07/2013 Physical Exam  Nursing note and vitals reviewed. Constitutional: She appears well-developed and well-nourished. No distress.  Awake, alert, nontoxic appearance  HENT:  Head: Normocephalic and atraumatic.  Mouth/Throat: Oropharynx is clear and moist. No oropharyngeal exudate.  Eyes: Conjunctivae are normal. No scleral icterus.  Neck: Normal range of motion. Neck supple.  Cardiovascular: Regular rhythm, normal heart sounds and intact distal pulses.   No murmur heard. Tachycardia  Pulmonary/Chest: Effort normal and breath sounds normal. No respiratory distress. She has no wheezes.  Equal chest expansion  Abdominal: Soft. Bowel sounds are normal. She exhibits no distension and no mass. There  is no tenderness. There is no rebound and no guarding.  Obese Abdomen soft and nontender  Genitourinary:  Foley in place with significant amount of sedimentation and discolored urine  Musculoskeletal: Normal range of motion. She exhibits no edema.  No peripheral edema No calf tenderness No palpable cord Negative Homans sign  Neurological: She is alert. She exhibits normal muscle tone. Coordination normal.  Speech is clear and goal oriented Moves extremities without ataxia  Skin: Skin is warm and dry. She is not diaphoretic. No erythema.  Large well-healing surgical wound to the left upper thigh and groin area; no erythema, induration or purulent drainage,  healthy granulation tissue noted to the base of the wound; no tenderness to palpation  Psychiatric: She has a normal mood and affect.    ED Course  Procedures  (including critical care time) Labs Review Labs Reviewed  CBC WITH DIFFERENTIAL - Abnormal; Notable for the following:    RBC 3.60 (*)    Hemoglobin 9.7 (*)    HCT 29.7 (*)    RDW 15.7 (*)    All other components within normal limits  COMPREHENSIVE METABOLIC PANEL - Abnormal; Notable for the following:    Glucose, Bld 328 (*)    Albumin 2.7 (*)    Total Bilirubin <0.2 (*)    All other components within normal limits  URINALYSIS, ROUTINE W REFLEX MICROSCOPIC - Abnormal; Notable for the following:    APPearance CLOUDY (*)    Glucose, UA 100 (*)    Hgb urine dipstick MODERATE (*)    Protein, ur 100 (*)    Leukocytes, UA LARGE (*)    All other components within normal limits  URINE MICROSCOPIC-ADD ON - Abnormal; Notable for the following:    Bacteria, UA MANY (*)    All other components within normal limits  CBG MONITORING, ED - Abnormal; Notable for the following:    Glucose-Capillary 294 (*)    All other components within normal limits  I-STAT VENOUS BLOOD GAS, ED - Abnormal; Notable for the following:    pH, Ven 7.410 (*)    pCO2, Ven 34.6 (*)    All other components within normal limits  I-STAT CG4 LACTIC ACID, ED    Imaging Review Dg Chest 2 View  10/26/2013   CLINICAL DATA:  Fever and hypertension  EXAM: CHEST  2 VIEW  COMPARISON:  09/05/2013  FINDINGS: Normal heart size. Lungs are under aerated with minimal basilar subsegmental atelectasis. No pleural effusion or pneumothorax.  IMPRESSION: Bibasilar atelectasis.   Electronically Signed   By: Maryclare Bean M.D.   On: 10/26/2013 17:53     EKG Interpretation   Date/Time:  Tuesday October 26 2013 19:40:34 EDT Ventricular Rate:  91 PR Interval:  194 QRS Duration: 86 QT Interval:  369 QTC Calculation: 454 R Axis:   63 Text Interpretation:  Sinus rhythm Borderline repolarization abnormality  No significant change was found Confirmed by Manus Gunning  MD, STEPHEN [phone])  on 10/26/2013 7:50:50 PM      MDM   Final diagnoses:   Essential hypertension  UTI (lower urinary tract infection)   [name] patient with several risk factors for infection including her diabetes, recent surgery and indwelling Foley catheter.  Patient does not meet sepsis criteria, but UA is indicative of urinary tract infection.  Patient reports fever earlier today but is afebrile here for department. Hyperglycemia without evidence of DKA. Will give fluid bolus, Rocephin and change Foley catheter.  Patient currently has followup with her primary  care physician tomorrow morning.    Patient noted to be hypertensive in the emergency department.  No signs of hypertensive urgency. Patient given her home blood pressure medications and has refill awaiting tonight.   8:30PM Patient remains hypertensive despite administration of her home medications. We'll give metoprolol.  9:30PM Patient continues to remain hypertensive. She was offered admission at this time and refuses. Will give labetalol.  She remains chest pain, shortness of breath or headache.  10:21 PM Patient blood pressure has decreased at this time. She is no longer tachycardic. Patient was offered admission for her hypertension however she declined. Patient remained without chest pain, shortness of breath or headache throughout her time here in the emergency department.  Patient's UTI treated with IV Rocephin and fluid bolus.  She's remained afebrile without abdominal pain nausea or vomiting here in the emergency department. No CVA tenderness to suggest pyelonephritis.  The patient was discussed with and seen by Dr. Manus Gunning who agrees with the treatment plan.   Dahlia Client Satori Krabill, PA-C 10/26/13 2225

## 2013-10-26 NOTE — ED Notes (Signed)
Nurse at the bedside attempting Foley.

## 2013-10-26 NOTE — ED Notes (Signed)
Phlebotomy at the bedside  

## 2013-10-26 NOTE — ED Notes (Signed)
Pt presents to department for evaluation of hypertension and fever. Pt states she has foley catheter from previous surgery. Sediment noted in foley catheter upon arrival to ED. Pt also states she ran out of hypertension medications x2 days ago. Pt is alert and oriented x4.

## 2013-10-26 NOTE — ED Notes (Signed)
Pt became angry with this NT because she had to get in a hospital gown. This NT tech explained that it was hospital policy and she said that she didn;t want to ant that she was only here for catheter problems and high blood pressure. This NT explained that the Dr. Eulah Citizen need to take a look at the catheter. Pt still refused. Kim RN notified

## 2013-10-27 NOTE — ED Provider Notes (Signed)
Medical screening examination/treatment/procedure(s) were conducted as a shared visit with non-physician practitioner(s) and myself.  I personally evaluated the patient during the encounter.  HHRN reports HTN and fever.  Indwelling foley x 2months after I+D of thigh.  Out of BP meds x 2 days. No chest pain, SOB, headache, vision change, eye pain. Nontoxic, mild tachycardia, lungs clear. Abdomen soft.  L thigh wound clean baseed with granulation tissue, no cellulitis or fluctuance. Hypertensive to 200-220 systolic  Denies symptoms. States only home BP med is amlodipine. Patient does not want to be admitted.  BP improved with labetalol. Suspect reflex tachycardia from being off of amlodipine. No evidence of hypertensive emergency. Treat UTI and change foley.  BP 163/101  Pulse 100  Temp(Src) 99.2 F (37.3 C) (Oral)  Resp 16  SpO2 97%  LMP 10/07/2013    EKG Interpretation   Date/Time:  Tuesday October 26 2013 19:40:34 EDT Ventricular Rate:  91 PR Interval:  194 QRS Duration: 86 QT Interval:  369 QTC Calculation: 454 R Axis:   63 Text Interpretation:  Sinus rhythm Borderline repolarization abnormality  No significant change was found Confirmed by Manus Gunning  MD, Willem Klingensmith [phone])  on 10/26/2013 7:50:50 PM       Glynn Octave, MD 10/27/13 1056

## 2013-11-10 ENCOUNTER — Encounter (INDEPENDENT_AMBULATORY_CARE_PROVIDER_SITE_OTHER): Payer: Medicaid Other | Admitting: General Surgery

## 2013-12-13 ENCOUNTER — Encounter (HOSPITAL_COMMUNITY): Payer: Self-pay | Admitting: Emergency Medicine

## 2014-02-24 ENCOUNTER — Encounter (HOSPITAL_COMMUNITY): Payer: Self-pay | Admitting: Obstetrics and Gynecology

## 2014-07-21 ENCOUNTER — Encounter (HOSPITAL_COMMUNITY): Payer: Self-pay | Admitting: Obstetrics and Gynecology

## 2015-02-04 ENCOUNTER — Encounter (HOSPITAL_COMMUNITY): Payer: Self-pay

## 2015-02-04 ENCOUNTER — Emergency Department (HOSPITAL_COMMUNITY): Payer: Medicaid Other

## 2015-02-04 ENCOUNTER — Inpatient Hospital Stay (HOSPITAL_COMMUNITY)
Admission: EM | Admit: 2015-02-04 | Discharge: 2015-02-10 | DRG: 291 | Disposition: A | Discharge status: Home Health | Payer: Medicaid Other | Attending: Internal Medicine | Admitting: Internal Medicine

## 2015-02-04 DIAGNOSIS — E1122 Type 2 diabetes mellitus with diabetic chronic kidney disease: Secondary | ICD-10-CM | POA: Diagnosis present

## 2015-02-04 DIAGNOSIS — D509 Iron deficiency anemia, unspecified: Secondary | ICD-10-CM | POA: Diagnosis present

## 2015-02-04 DIAGNOSIS — I330 Acute and subacute infective endocarditis: Secondary | ICD-10-CM | POA: Diagnosis present

## 2015-02-04 DIAGNOSIS — I5033 Acute on chronic diastolic (congestive) heart failure: Secondary | ICD-10-CM | POA: Diagnosis present

## 2015-02-04 DIAGNOSIS — J45909 Unspecified asthma, uncomplicated: Secondary | ICD-10-CM | POA: Diagnosis present

## 2015-02-04 DIAGNOSIS — D649 Anemia, unspecified: Secondary | ICD-10-CM | POA: Diagnosis present

## 2015-02-04 DIAGNOSIS — R601 Generalized edema: Secondary | ICD-10-CM

## 2015-02-04 DIAGNOSIS — R319 Hematuria, unspecified: Secondary | ICD-10-CM | POA: Diagnosis present

## 2015-02-04 DIAGNOSIS — N184 Chronic kidney disease, stage 4 (severe): Secondary | ICD-10-CM | POA: Diagnosis present

## 2015-02-04 DIAGNOSIS — E8809 Other disorders of plasma-protein metabolism, not elsewhere classified: Secondary | ICD-10-CM | POA: Diagnosis present

## 2015-02-04 DIAGNOSIS — I130 Hypertensive heart and chronic kidney disease with heart failure and stage 1 through stage 4 chronic kidney disease, or unspecified chronic kidney disease: Principal | ICD-10-CM | POA: Diagnosis present

## 2015-02-04 DIAGNOSIS — Z7982 Long term (current) use of aspirin: Secondary | ICD-10-CM

## 2015-02-04 DIAGNOSIS — Z79899 Other long term (current) drug therapy: Secondary | ICD-10-CM

## 2015-02-04 DIAGNOSIS — N049 Nephrotic syndrome with unspecified morphologic changes: Secondary | ICD-10-CM | POA: Diagnosis present

## 2015-02-04 DIAGNOSIS — N185 Chronic kidney disease, stage 5: Secondary | ICD-10-CM

## 2015-02-04 DIAGNOSIS — R7989 Other specified abnormal findings of blood chemistry: Secondary | ICD-10-CM

## 2015-02-04 DIAGNOSIS — Z6841 Body Mass Index (BMI) 40.0 and over, adult: Secondary | ICD-10-CM

## 2015-02-04 DIAGNOSIS — Z794 Long term (current) use of insulin: Secondary | ICD-10-CM

## 2015-02-04 DIAGNOSIS — Z8673 Personal history of transient ischemic attack (TIA), and cerebral infarction without residual deficits: Secondary | ICD-10-CM

## 2015-02-04 DIAGNOSIS — N179 Acute kidney failure, unspecified: Secondary | ICD-10-CM | POA: Diagnosis present

## 2015-02-04 HISTORY — DX: Cerebral infarction, unspecified: I63.9

## 2015-02-04 MED ORDER — FUROSEMIDE 10 MG/ML IJ SOLN
40.0000 mg | Freq: Once | INTRAMUSCULAR | Status: AC
  Administered 2015-02-05: 40 mg via INTRAVENOUS
  Filled 2015-02-04: qty 4

## 2015-02-04 MED ORDER — ALBUTEROL SULFATE (2.5 MG/3ML) 0.083% IN NEBU
5.0000 mg | INHALATION_SOLUTION | Freq: Once | RESPIRATORY_TRACT | Status: AC
  Administered 2015-02-05: 5 mg via RESPIRATORY_TRACT
  Filled 2015-02-04: qty 6

## 2015-02-04 NOTE — ED Notes (Signed)
Pt here with c/o right leg swelling and weeping of fluids. Her left leg is swelling as well but reports right leg is worse, worsening over the past 2 weeks. Pt also states concern regarding a cut to her left lower leg.

## 2015-02-04 NOTE — ED Notes (Signed)
Found that the husband has been moving patient in and out of bed himself. reveiwed staff present. Family states  "i do this at home". Patient returned to bed with 3 person assist, 2 RN's and female visitor, to prepare for xray transport.

## 2015-02-04 NOTE — ED Provider Notes (Signed)
CSN: [id number]     Arrival date & time 02/04/15  1644 History  By signing my name below, I, [name], attest that this documentation has been prepared under the direction and in the presence of [name], [place]. Electronically Signed: Marica [name], ED Scribe. 02/05/2015. 11:04 PM.   Chief Complaint  Patient presents with  . Leg Swelling   The history is provided by the patient. No language interpreter was used.   PCP: Lonia Blood, MD HPI Comments: [name] is a 39 y.o. female, with PMHx noted below including DMTII, HTN, asthma, who presents to the Emergency Department complaining of worsening right leg swelling onset 2-3 weeks ago. She notes she now is having swelling at her abdomen and right hand/arm. Pt reports the right leg has been leaking clear fluid for the past week-- pt specifies that she develops small blisters which then leak clear fluid. Associated Sx include right foot pain, SOB, wheezing, and chest pain. Pt notes her SOB is worsened with walking or laying supine. Pt reports she has been treating her wheezing and SOB at home with albuterol tx without significant relief. Pt denies any Hx of similar swelling of LE. Pt denies fever, chills, palpitations, cough, abd pain, n/v/d, urinary sxs, numbness, tingling, weakness. Pt reports taking daily fluid pills (lasix) and denies any recent changes to her fluid pills. Pt denies any Hx of COPD or CHF.   Past Medical History  Diagnosis Date  . Morbid obesity (HCC)   . Hypertension   . CVA (cerebral infarction) 1990's    "writing is not the same since"  . Asthma   . Type II diabetes mellitus (HCC)   . GERD (gastroesophageal reflux disease)    Past Surgical History  Procedure Laterality Date  . Incision and drainage of wound Left 08/30/2013    thigh necrotic wound/notes 08/30/2013  . Irrigation and debridement abscess Left 08/30/2013    Procedure: IRRIGATION AND DEBRIDEMENT ABSCESS Left Thigh Necrotic Wound;  Surgeon:  [name], MD;  Location: [facility] OR;  Service: General;  Laterality: Left;  . Tee without cardioversion N/A 09/07/2013    Procedure: TRANSESOPHAGEAL ECHOCARDIOGRAM (TEE);  Surgeon: [name], MD;  Location: [facility] ENDOSCOPY;  Service: Cardiovascular;  Laterality: N/A;   Family History  Problem Relation Age of Onset  . Diabetes Mother   . Kidney disease Mother    Social History  Substance Use Topics  . Smoking status: Never Smoker   . Smokeless tobacco: Never Used  . Alcohol Use: No   OB History    Gravida Para Term Preterm AB TAB SAB Ectopic Multiple Living   0 0 0 0 0 0 3     Review of Systems  Respiratory: Positive for shortness of breath and wheezing. Negative for cough.   Cardiovascular: Positive for chest pain and leg swelling.  Gastrointestinal: Negative for nausea, vomiting and abdominal pain.  Musculoskeletal: Positive for arthralgias (right foot pain).  All other systems reviewed and are negative.  Allergies  Azithromycin  Home Medications   Prior to Admission medications   Medication Sig Start Date End Date Taking? Authorizing Provider  albuterol (PROVENTIL HFA;VENTOLIN HFA) 108 (90 BASE) MCG/ACT inhaler Inhale 2 puffs into the lungs every 4 (four) hours as needed for wheezing or shortness of breath. 02/21/13  Yes Arthor Captain, PA-C  amLODipine (NORVASC) 10 MG tablet Take 1 tablet (10 mg total) by mouth daily. 09/20/13  Yes Richarda Overlie, MD  arformoterol (BROVANA) 15 MCG/2ML NEBU Take 15  mcg by nebulization every 12 (twelve) hours. 09/09/13  Yes Maryann Mikhail, DO  aspirin EC 325 MG EC tablet Take 1 tablet (325 mg total) by mouth daily. 09/09/13  Yes Maryann Mikhail, DO  atorvastatin (LIPITOR) 20 MG tablet Take 1 tablet (20 mg total) by mouth daily at 6 PM. 09/09/13  Yes Maryann Mikhail, DO  budesonide (PULMICORT) 0.25 MG/2ML nebulizer solution Take 0.25 mg by nebulization every 12 (twelve) hours. 09/09/13  Yes Maryann Mikhail, DO  furosemide (LASIX) 40 MG  tablet Take 40 mg by mouth daily. 01/26/15  Yes Historical Provider, MD  gemfibrozil (LOPID) 600 MG tablet Take 1 tablet (600 mg total) by mouth 2 (two) times daily before a meal. 09/09/13  Yes Maryann Mikhail, DO  insulin aspart (NOVOLOG) 100 UNIT/ML injection Inject 0-20 Units into the skin 3 (three) times daily with meals. CBG < 70: implement hypoglycemia protocol CBG 70 - 120: 0 units CBG 121 - 150: 3 units CBG 151 - 200: 4 units CBG 201 - 250: 7 units CBG 251 - 300: 11 units CBG 301 - 350: 15 units CBG 351 - 400: 20 units 09/09/13  Yes Maryann Mikhail, DO  insulin glargine (LANTUS) 100 UNIT/ML injection Inject 0.15 mLs (15 Units total) into the skin daily. Patient taking differently: Inject 15 Units into the skin at bedtime.  09/20/13  Yes Richarda OverlieNayana Abrol, MD  acetaminophen (TYLENOL) 325 MG tablet Take 650 mg by mouth every 6 (six) hours as needed for mild pain, fever or headache. 09/09/13   Edsel PetrinMaryann Mikhail, DO   Triage Vitals: BP 219/111 mmHg  Pulse 112  Temp(Src) 98.2 F (36.8 C) (Oral)  Resp 20  SpO2 97% Physical Exam  Constitutional: She is oriented to person, place, and time. She appears well-developed and well-nourished.  HENT:  Head: Normocephalic and atraumatic.  Mouth/Throat: Oropharynx is clear and moist. No oropharyngeal exudate.  Eyes: Conjunctivae and EOM are normal. Right eye exhibits no discharge. Left eye exhibits no discharge. No scleral icterus.  Neck: Normal range of motion. Neck supple.  Cardiovascular: Normal rate, regular rhythm, normal heart sounds and intact distal pulses.   Pulmonary/Chest: Effort normal. She has wheezes. She has no rales. She exhibits no tenderness.  Diffuse mild expiratory wheezing noted with mild increased work of breathing.  Abdominal: Soft. Bowel sounds are normal. She exhibits no distension and no mass. There is no tenderness. There is no rebound and no guarding.  2+ pitting edema noted to lower abdomen  Musculoskeletal: She exhibits  edema. She exhibits no tenderness.  4+ pitting edema in right forefoot extending up to thighs with weeping serous fluid noted to right calf, 2+ pitting edema in leftforefoot extending up to thighs. Nonpitting edema noted to arms. FROM of BUE and BLE, sensation intact. 2+ radial and radial and PT pulses, cap refill <2.   Neurological: She is alert and oriented to person, place, and time.  Skin: Skin is warm and dry.  Nursing note and vitals reviewed.   ED Course  Procedures (including critical care time) DIAGNOSTIC STUDIES: Oxygen Saturation is 97% on ra, nl by my interpretation.    COORDINATION OF CARE: 11:01 PM: Discussed treatment plan which includes breathing Tx, chest xray, labs, EKG, possible admission to the hospital, with pt at bedside; patient verbalizes understanding and agrees with treatment plan.  Labs Review Labs Reviewed  CBC WITH DIFFERENTIAL/PLATELET - Abnormal; Notable for the following:    RBC 3.74 (*)    Hemoglobin 9.9 (*)    HCT 32.6 (*)  All other components within normal limits  BASIC METABOLIC PANEL - Abnormal; Notable for the following:    Chloride 112 (*)    Glucose, Bld 117 (*)    BUN 24 (*)    Creatinine, Ser 2.98 (*)    GFR calc non Af Amer 19 (*)    GFR calc Af Amer 22 (*)    All other components within normal limits  BRAIN NATRIURETIC PEPTIDE - Abnormal; Notable for the following:    B Natriuretic Peptide 349.2 (*)    All other components within normal limits  HEPATIC FUNCTION PANEL - Abnormal; Notable for the following:    Total Protein 6.2 (*)    Albumin 2.3 (*)    AST 12 (*)    ALT 11 (*)    All other components within normal limits  TROPONIN I - Abnormal; Notable for the following:    Troponin I 0.05 (*)    All other components within normal limits  URINALYSIS, ROUTINE W REFLEX MICROSCOPIC (NOT AT [facility])    Imaging Review Dg Chest 2 View  02/05/2015  CLINICAL DATA:  39 year old female with shortness of breath and bilateral lower  extremity swelling. EXAM: CHEST  2 VIEW COMPARISON:  Radiograph dated 10/26/2013 FINDINGS: Evaluation is limited due to soft tissue attenuation. Two views of the chest do not demonstrate focal consolidation. There is no pleural effusion or pneumothorax. - there is mild cardiomegaly. The osseous structures appear unremarkable. IMPRESSION: No focal consolidation. Electronically Signed   By: Elgie Collard M.D.   On: 02/05/2015 00:04   I have personally reviewed and evaluated these images and lab results as part of my medical decision-making.    MDM   Final diagnoses:  Anasarca  Elevated serum creatinine  Elevated brain natriuretic peptide (BNP) level    Pt presents with worsening swelling right leg with associated weeping. Endorses associated CP, SOB and wheezing, no relief with albuterol at home. Hx of HTN, DM and asthma. She notes she has been taking her lasix as prescribed. Afebrile, HR 115, normotensive. Exam revealed 4+ pitting edema in right leg with weeping serous fluid, 2+ pitting edema in left leg and edema noted in bilateral arms, 2+ pitting edema noted to lower abdomen, mild wheezing noted on lung exam. EKG showed sinus tachycardia, no significant changes from prior. Cr 2.98, BNP 349.2. CXR showed no focal consolidation. Pt reports mild improvement of SOB/wheezing s/p neb tx. Consulted hospitalist for admission, Dr. Lovell Sheehan agrees to admission. Orders placed for tele bed. Discussed results and plan for admission with pt.    I personally performed the services described in this documentation, which was scribed in my presence. The recorded information has been reviewed and is accurate.     Satira Sark Echo, [place] 02/05/15 0202  Dione Booze, MD 02/05/15 [phone]

## 2015-02-05 ENCOUNTER — Inpatient Hospital Stay (HOSPITAL_COMMUNITY): Payer: Medicaid Other

## 2015-02-05 DIAGNOSIS — R601 Generalized edema: Secondary | ICD-10-CM

## 2015-02-05 DIAGNOSIS — D6489 Other specified anemias: Secondary | ICD-10-CM | POA: Diagnosis not present

## 2015-02-05 DIAGNOSIS — J45909 Unspecified asthma, uncomplicated: Secondary | ICD-10-CM | POA: Diagnosis present

## 2015-02-05 DIAGNOSIS — J4530 Mild persistent asthma, uncomplicated: Secondary | ICD-10-CM

## 2015-02-05 DIAGNOSIS — E8809 Other disorders of plasma-protein metabolism, not elsewhere classified: Secondary | ICD-10-CM

## 2015-02-05 DIAGNOSIS — D509 Iron deficiency anemia, unspecified: Secondary | ICD-10-CM | POA: Diagnosis present

## 2015-02-05 DIAGNOSIS — R319 Hematuria, unspecified: Secondary | ICD-10-CM | POA: Diagnosis present

## 2015-02-05 DIAGNOSIS — N184 Chronic kidney disease, stage 4 (severe): Secondary | ICD-10-CM | POA: Diagnosis not present

## 2015-02-05 DIAGNOSIS — Z7982 Long term (current) use of aspirin: Secondary | ICD-10-CM | POA: Diagnosis not present

## 2015-02-05 DIAGNOSIS — Z8673 Personal history of transient ischemic attack (TIA), and cerebral infarction without residual deficits: Secondary | ICD-10-CM | POA: Diagnosis not present

## 2015-02-05 DIAGNOSIS — N179 Acute kidney failure, unspecified: Secondary | ICD-10-CM | POA: Diagnosis not present

## 2015-02-05 DIAGNOSIS — I130 Hypertensive heart and chronic kidney disease with heart failure and stage 1 through stage 4 chronic kidney disease, or unspecified chronic kidney disease: Secondary | ICD-10-CM | POA: Diagnosis present

## 2015-02-05 DIAGNOSIS — I509 Heart failure, unspecified: Secondary | ICD-10-CM

## 2015-02-05 DIAGNOSIS — R5381 Other malaise: Secondary | ICD-10-CM | POA: Diagnosis not present

## 2015-02-05 DIAGNOSIS — E1122 Type 2 diabetes mellitus with diabetic chronic kidney disease: Secondary | ICD-10-CM | POA: Diagnosis present

## 2015-02-05 DIAGNOSIS — I5033 Acute on chronic diastolic (congestive) heart failure: Secondary | ICD-10-CM | POA: Diagnosis present

## 2015-02-05 DIAGNOSIS — N049 Nephrotic syndrome with unspecified morphologic changes: Secondary | ICD-10-CM | POA: Diagnosis present

## 2015-02-05 DIAGNOSIS — I340 Nonrheumatic mitral (valve) insufficiency: Secondary | ICD-10-CM | POA: Diagnosis not present

## 2015-02-05 DIAGNOSIS — I5031 Acute diastolic (congestive) heart failure: Secondary | ICD-10-CM | POA: Diagnosis not present

## 2015-02-05 DIAGNOSIS — Z6841 Body Mass Index (BMI) 40.0 and over, adult: Secondary | ICD-10-CM | POA: Diagnosis not present

## 2015-02-05 DIAGNOSIS — Z79899 Other long term (current) drug therapy: Secondary | ICD-10-CM | POA: Diagnosis not present

## 2015-02-05 DIAGNOSIS — D649 Anemia, unspecified: Secondary | ICD-10-CM | POA: Diagnosis present

## 2015-02-05 DIAGNOSIS — I330 Acute and subacute infective endocarditis: Secondary | ICD-10-CM | POA: Diagnosis present

## 2015-02-05 DIAGNOSIS — Z794 Long term (current) use of insulin: Secondary | ICD-10-CM | POA: Diagnosis not present

## 2015-02-05 DIAGNOSIS — I359 Nonrheumatic aortic valve disorder, unspecified: Secondary | ICD-10-CM | POA: Diagnosis not present

## 2015-02-05 HISTORY — DX: Generalized edema: R60.1

## 2015-02-05 LAB — BASIC METABOLIC PANEL
ANION GAP: 9 (ref 5–15)
BUN: 24 mg/dL — ABNORMAL HIGH (ref 6–20)
CHLORIDE: 112 mmol/L — AB (ref 101–111)
CO2: 23 mmol/L (ref 22–32)
Calcium: 8.9 mg/dL (ref 8.9–10.3)
Creatinine, Ser: 2.98 mg/dL — ABNORMAL HIGH (ref 0.44–1.00)
GFR calc Af Amer: 22 mL/min — ABNORMAL LOW (ref >60)
GFR calc non Af Amer: 19 mL/min — ABNORMAL LOW (ref >60)
GLUCOSE: 117 mg/dL — AB (ref 65–99)
Potassium: 5 mmol/L (ref 3.5–5.1)
Sodium: 144 mmol/L (ref 135–145)

## 2015-02-05 LAB — CBC WITH DIFFERENTIAL/PLATELET
BASOS ABS: 0 10*3/uL (ref 0.0–0.1)
Basophils Relative: 0 %
Eosinophils Absolute: 0.2 10*3/uL (ref 0.0–0.7)
Eosinophils Relative: 4 %
HEMATOCRIT: 32.6 % — AB (ref 36.0–46.0)
HEMOGLOBIN: 9.9 g/dL — AB (ref 12.0–15.0)
Lymphocytes Relative: 32 %
Lymphs Abs: 2.1 10*3/uL (ref 0.7–4.0)
MCH: 26.5 pg (ref 26.0–34.0)
MCHC: 30.4 g/dL (ref 30.0–36.0)
MCV: 87.2 fL (ref 78.0–100.0)
MONO ABS: 0.5 10*3/uL (ref 0.1–1.0)
Monocytes Relative: 8 %
NEUTROS ABS: 3.6 10*3/uL (ref 1.7–7.7)
Neutrophils Relative %: 56 %
Platelets: 261 10*3/uL (ref 150–400)
RBC: 3.74 MIL/uL — ABNORMAL LOW (ref 3.87–5.11)
RDW: 15.2 % (ref 11.5–15.5)
WBC: 6.5 10*3/uL (ref 4.0–10.5)

## 2015-02-05 LAB — URINE MICROSCOPIC-ADD ON

## 2015-02-05 LAB — URINALYSIS, ROUTINE W REFLEX MICROSCOPIC
BILIRUBIN URINE: NEGATIVE
Glucose, UA: 100 mg/dL — AB
Ketones, ur: NEGATIVE mg/dL
Leukocytes, UA: NEGATIVE
NITRITE: NEGATIVE
PH: 5.5 (ref 5.0–8.0)
Protein, ur: >300 mg/dL — AB
SPECIFIC GRAVITY, URINE: 1.013 (ref 1.005–1.030)

## 2015-02-05 LAB — MRSA PCR SCREENING: MRSA BY PCR: NEGATIVE

## 2015-02-05 LAB — HEPATIC FUNCTION PANEL
ALBUMIN: 2.3 g/dL — AB (ref 3.5–5.0)
ALT: 11 U/L — AB (ref 14–54)
AST: 12 U/L — AB (ref 15–41)
Alkaline Phosphatase: 46 U/L (ref 38–126)
BILIRUBIN DIRECT: 0.1 mg/dL (ref 0.1–0.5)
BILIRUBIN INDIRECT: 0.3 mg/dL (ref 0.3–0.9)
TOTAL PROTEIN: 6.2 g/dL — AB (ref 6.5–8.1)
Total Bilirubin: 0.4 mg/dL (ref 0.3–1.2)

## 2015-02-05 LAB — GLUCOSE, CAPILLARY
GLUCOSE-CAPILLARY: 93 mg/dL (ref 65–99)
GLUCOSE-CAPILLARY: 84 mg/dL (ref 65–99)
GLUCOSE-CAPILLARY: 112 mg/dL — AB (ref 65–99)
Glucose-Capillary: 113 mg/dL — ABNORMAL HIGH (ref 65–99)
Glucose-Capillary: 89 mg/dL (ref 65–99)

## 2015-02-05 LAB — PREALBUMIN: PREALBUMIN: 14.1 mg/dL — AB (ref 18–38)

## 2015-02-05 LAB — TSH: TSH: 2.35 u[IU]/mL (ref 0.350–4.500)

## 2015-02-05 LAB — BRAIN NATRIURETIC PEPTIDE: B Natriuretic Peptide: 349.2 pg/mL — ABNORMAL HIGH (ref 0.0–100.0)

## 2015-02-05 LAB — TROPONIN I: Troponin I: 0.05 ng/mL — ABNORMAL HIGH (ref <0.031)

## 2015-02-05 MED ORDER — METOLAZONE 2.5 MG PO TABS
2.5000 mg | ORAL_TABLET | Freq: Two times a day (BID) | ORAL | Status: DC
  Administered 2015-02-05: 2.5 mg via ORAL
  Filled 2015-02-05: qty 1

## 2015-02-05 MED ORDER — METOLAZONE 2.5 MG PO TABS
2.5000 mg | ORAL_TABLET | Freq: Two times a day (BID) | ORAL | Status: DC
Start: 2015-02-05 — End: 2015-02-05
  Filled 2015-02-05 (×3): qty 1

## 2015-02-05 MED ORDER — SODIUM CHLORIDE 0.9 % IJ SOLN: 3.0000 mL | INTRAMUSCULAR | Status: DC | PRN

## 2015-02-05 MED ORDER — ASPIRIN EC 325 MG PO TBEC
325.0000 mg | DELAYED_RELEASE_TABLET | Freq: Every day | ORAL | Status: DC
  Administered 2015-02-05 – 2015-02-10 (×5): 325 mg via ORAL
  Filled 2015-02-05 (×5): qty 1

## 2015-02-05 MED ORDER — ARFORMOTEROL TARTRATE 15 MCG/2ML IN NEBU: 15.0000 ug | INHALATION_SOLUTION | Freq: Two times a day (BID) | RESPIRATORY_TRACT | Status: DC

## 2015-02-05 MED ORDER — INSULIN GLARGINE 100 UNIT/ML ~~LOC~~ SOLN
15.0000 [IU] | Freq: Every day | SUBCUTANEOUS | Status: DC
  Administered 2015-02-05 – 2015-02-08 (×4): 15 [IU] via SUBCUTANEOUS
  Filled 2015-02-05 (×7): qty 0.15

## 2015-02-05 MED ORDER — ARFORMOTEROL TARTRATE 15 MCG/2ML IN NEBU
15.0000 ug | INHALATION_SOLUTION | Freq: Two times a day (BID) | RESPIRATORY_TRACT | Status: DC
  Administered 2015-02-05 – 2015-02-10 (×10): 15 ug via RESPIRATORY_TRACT
  Filled 2015-02-05 (×12): qty 2

## 2015-02-05 MED ORDER — METOLAZONE 2.5 MG PO TABS
2.5000 mg | ORAL_TABLET | Freq: Two times a day (BID) | ORAL | Status: DC
  Administered 2015-02-05 – 2015-02-08 (×6): 2.5 mg via ORAL
  Filled 2015-02-05 (×7): qty 1

## 2015-02-05 MED ORDER — FUROSEMIDE 10 MG/ML IJ SOLN: 80.0000 mg | Freq: Two times a day (BID) | INTRAMUSCULAR | Status: DC

## 2015-02-05 MED ORDER — INSULIN ASPART 100 UNIT/ML ~~LOC~~ SOLN: 0.0000 [IU] | Freq: Every day | SUBCUTANEOUS | Status: DC

## 2015-02-05 MED ORDER — NITROGLYCERIN 2 % TD OINT
0.5000 [in_us] | TOPICAL_OINTMENT | Freq: Four times a day (QID) | TRANSDERMAL | Status: DC
  Administered 2015-02-05: 0.5 [in_us] via TOPICAL
  Filled 2015-02-05: qty 30

## 2015-02-05 MED ORDER — GEMFIBROZIL 600 MG PO TABS
600.0000 mg | ORAL_TABLET | Freq: Two times a day (BID) | ORAL | Status: DC
  Administered 2015-02-05 – 2015-02-10 (×10): 600 mg via ORAL
  Filled 2015-02-05 (×15): qty 1

## 2015-02-05 MED ORDER — ACETAMINOPHEN 325 MG PO TABS
650.0000 mg | ORAL_TABLET | ORAL | Status: DC | PRN
  Administered 2015-02-08: 650 mg via ORAL
  Filled 2015-02-05: qty 2

## 2015-02-05 MED ORDER — BUDESONIDE 0.25 MG/2ML IN SUSP
0.2500 mg | Freq: Two times a day (BID) | RESPIRATORY_TRACT | Status: DC
  Administered 2015-02-05 – 2015-02-10 (×10): 0.25 mg via RESPIRATORY_TRACT
  Filled 2015-02-05 (×11): qty 2

## 2015-02-05 MED ORDER — IPRATROPIUM-ALBUTEROL 0.5-2.5 (3) MG/3ML IN SOLN: 3.0000 mL | Freq: Four times a day (QID) | RESPIRATORY_TRACT | Status: DC

## 2015-02-05 MED ORDER — HEPARIN SODIUM (PORCINE) 5000 UNIT/ML IJ SOLN
5000.0000 [IU] | Freq: Three times a day (TID) | INTRAMUSCULAR | Status: DC
  Administered 2015-02-05 – 2015-02-10 (×15): 5000 [IU] via SUBCUTANEOUS
  Filled 2015-02-05 (×14): qty 1

## 2015-02-05 MED ORDER — CARVEDILOL 12.5 MG PO TABS
12.5000 mg | ORAL_TABLET | Freq: Two times a day (BID) | ORAL | Status: DC
  Administered 2015-02-05 – 2015-02-06 (×2): 12.5 mg via ORAL
  Filled 2015-02-05 (×2): qty 1

## 2015-02-05 MED ORDER — SODIUM CHLORIDE 0.9 % IV SOLN: 250.0000 mL | INTRAVENOUS | Status: DC | PRN

## 2015-02-05 MED ORDER — ATORVASTATIN CALCIUM 20 MG PO TABS
20.0000 mg | ORAL_TABLET | Freq: Every day | ORAL | Status: DC
  Administered 2015-02-05 – 2015-02-09 (×5): 20 mg via ORAL
  Filled 2015-02-05 (×5): qty 1

## 2015-02-05 MED ORDER — SODIUM CHLORIDE 0.9 % IJ SOLN
3.0000 mL | Freq: Two times a day (BID) | INTRAMUSCULAR | Status: DC
Start: 2015-02-05 — End: 2015-02-05
  Administered 2015-02-05: 3 mL via INTRAVENOUS

## 2015-02-05 MED ORDER — FUROSEMIDE 10 MG/ML IJ SOLN
80.0000 mg | Freq: Three times a day (TID) | INTRAMUSCULAR | Status: DC
  Administered 2015-02-05 – 2015-02-07 (×8): 80 mg via INTRAVENOUS
  Filled 2015-02-05 (×8): qty 8

## 2015-02-05 MED ORDER — IPRATROPIUM-ALBUTEROL 0.5-2.5 (3) MG/3ML IN SOLN
3.0000 mL | RESPIRATORY_TRACT | Status: DC
  Administered 2015-02-05 (×2): 3 mL via RESPIRATORY_TRACT
  Filled 2015-02-05 (×2): qty 3

## 2015-02-05 MED ORDER — BUDESONIDE 0.25 MG/2ML IN SUSP: 0.2500 mg | Freq: Two times a day (BID) | RESPIRATORY_TRACT | Status: DC

## 2015-02-05 MED ORDER — IPRATROPIUM-ALBUTEROL 0.5-2.5 (3) MG/3ML IN SOLN
3.0000 mL | RESPIRATORY_TRACT | Status: DC | PRN
  Administered 2015-02-05: 3 mL via RESPIRATORY_TRACT
  Filled 2015-02-05: qty 3

## 2015-02-05 MED ORDER — ONDANSETRON HCL 4 MG/2ML IJ SOLN
4.0000 mg | Freq: Four times a day (QID) | INTRAMUSCULAR | Status: DC | PRN
  Administered 2015-02-07: 4 mg via INTRAVENOUS
  Filled 2015-02-05 (×2): qty 2

## 2015-02-05 MED ORDER — CARVEDILOL 6.25 MG PO TABS
6.2500 mg | ORAL_TABLET | Freq: Two times a day (BID) | ORAL | Status: DC
  Administered 2015-02-05: 6.25 mg via ORAL
  Filled 2015-02-05: qty 1

## 2015-02-05 MED ORDER — NITROGLYCERIN IN D5W 200-5 MCG/ML-% IV SOLN
0.0000 ug/min | INTRAVENOUS | Status: DC
  Administered 2015-02-05: 40 ug/min via INTRAVENOUS
  Administered 2015-02-06: 80 ug/min via INTRAVENOUS
  Administered 2015-02-06: 50 ug/min via INTRAVENOUS
  Filled 2015-02-05 (×4): qty 250

## 2015-02-05 MED ORDER — CARVEDILOL 3.125 MG PO TABS: 3.1250 mg | ORAL_TABLET | Freq: Two times a day (BID) | ORAL | Status: DC

## 2015-02-05 MED ORDER — FUROSEMIDE 10 MG/ML IJ SOLN
80.0000 mg | Freq: Two times a day (BID) | INTRAMUSCULAR | Status: DC
  Administered 2015-02-05: 80 mg via INTRAVENOUS
  Filled 2015-02-05: qty 8

## 2015-02-05 MED ORDER — INSULIN ASPART 100 UNIT/ML ~~LOC~~ SOLN: 0.0000 [IU] | Freq: Three times a day (TID) | SUBCUTANEOUS | Status: DC

## 2015-02-05 NOTE — H&P (Signed)
[facility] Admission History and Physical       [name] ZOX:[id number] DOB: 1975/08/01 DOA: 02/04/2015  Referring physician: EDP PCP: Lonia Blood, MD  Specialists:   Chief Complaint: Increased Swelling of both Legs and SOB  HPI: [name] is a 39 y.o. female with a history of Asthma, HTN, DM2, and previous CVA who presents to the ED with complaints of worsening edema of both legs, along with SOB and DOE over the past 2-3 weeks.  She reports taking lasix and blood pressure medications but the Lasix has not been working.   In the ED her BUN?Cr was elevated at 24/2.98 and her last documented Creatinine 1 year ago was from 0.76 to 1.4.   She was administered a 40 mg dose of IV Lasix and given Albuterol Nebs and referred for admission.   She denies having chest pain.     Review of Systems:  Constitutional: No Weight Loss, No Weight Gain, Night Sweats, Fevers, Chills, Dizziness, Light Headedness, Fatigue, or Generalized Weakness HEENT: No Headaches, Difficulty Swallowing,Tooth/Dental Problems,Sore Throat,  No Sneezing, Rhinitis, Ear Ache, Nasal Congestion, or Post Nasal Drip,  Cardio-vascular:  No Chest pain, Orthopnea, PND, +Edema in Lower Extremities, Anasarca, Dizziness, Palpitations  Resp: +Dyspnea, +DOE, No Productive Cough, No Non-Productive Cough, No Hemoptysis, No Wheezing.    GI: No Heartburn, Indigestion, Abdominal Pain, Nausea, Vomiting, Diarrhea, Constipation, Hematemesis, Hematochezia, Melena, Change in Bowel Habits,  Loss of Appetite  GU: No Dysuria, No Change in Color of Urine, No Urgency or Urinary Frequency, No Flank pain.  Musculoskeletal: No Joint Pain or Swelling, No Decreased Range of Motion, No Back Pain.  Neurologic: No Syncope, No Seizures, Muscle Weakness, Paresthesia, Vision Disturbance or Loss, No Diplopia, No Vertigo, No Difficulty Walking,  Skin: No Rash or Lesions. Psych: No Change in Mood or Affect, No Depression or Anxiety, No  Memory loss, No Confusion, or Hallucinations   Past Medical History  Diagnosis Date  . Morbid obesity (HCC)   . Hypertension   . CVA (cerebral infarction) 1990's    "writing is not the same since"  . Asthma   . Type II diabetes mellitus (HCC)   . GERD (gastroesophageal reflux disease)      Past Surgical History  Procedure Laterality Date  . Incision and drainage of wound Left 08/30/2013    thigh necrotic wound/notes 08/30/2013  . Irrigation and debridement abscess Left 08/30/2013    Procedure: IRRIGATION AND DEBRIDEMENT ABSCESS Left Thigh Necrotic Wound;  Surgeon: [name], MD;  Location: [facility] OR;  Service: General;  Laterality: Left;  . Tee without cardioversion N/A 09/07/2013    Procedure: TRANSESOPHAGEAL ECHOCARDIOGRAM (TEE);  Surgeon: [name], MD;  Location: [facility] ENDOSCOPY;  Service: Cardiovascular;  Laterality: N/A;      Prior to Admission medications   Medication Sig Start Date End Date Taking? Authorizing Provider  albuterol (PROVENTIL HFA;VENTOLIN HFA) 108 (90 BASE) MCG/ACT inhaler Inhale 2 puffs into the lungs every 4 (four) hours as needed for wheezing or shortness of breath. 02/21/13  Yes Arthor Captain, PA-C  amLODipine (NORVASC) 10 MG tablet Take 1 tablet (10 mg total) by mouth daily. 09/20/13  Yes Richarda Overlie, MD  arformoterol (BROVANA) 15 MCG/2ML NEBU Take 15 mcg by nebulization every 12 (twelve) hours. 09/09/13  Yes Maryann Mikhail, DO  aspirin EC 325 MG EC tablet Take 1 tablet (325 mg total) by mouth daily. 09/09/13  Yes Maryann Mikhail, DO  atorvastatin (LIPITOR) 20 MG tablet Take 1 tablet (20 mg total) by mouth  daily at 6 PM. 09/09/13  Yes Maryann Mikhail, DO  budesonide (PULMICORT) 0.25 MG/2ML nebulizer solution Take 0.25 mg by nebulization every 12 (twelve) hours. 09/09/13  Yes Maryann Mikhail, DO  furosemide (LASIX) 40 MG tablet Take 40 mg by mouth daily. 01/26/15  Yes Historical Provider, MD  gemfibrozil (LOPID) 600 MG tablet Take 1 tablet (600 mg total)  by mouth 2 (two) times daily before a meal. 09/09/13  Yes Maryann Mikhail, DO  insulin aspart (NOVOLOG) 100 UNIT/ML injection Inject 0-20 Units into the skin 3 (three) times daily with meals. CBG < 70: implement hypoglycemia protocol CBG 70 - 120: 0 units CBG 121 - 150: 3 units CBG 151 - 200: 4 units CBG 201 - 250: 7 units CBG 251 - 300: 11 units CBG 301 - 350: 15 units CBG 351 - 400: 20 units 09/09/13  Yes Maryann Mikhail, DO  insulin glargine (LANTUS) 100 UNIT/ML injection Inject 0.15 mLs (15 Units total) into the skin daily. Patient taking differently: Inject 15 Units into the skin at bedtime.  09/20/13  Yes Richarda OverlieNayana Abrol, MD  acetaminophen (TYLENOL) 325 MG tablet Take 650 mg by mouth every 6 (six) hours as needed for mild pain, fever or headache. 09/09/13   Edsel PetrinMaryann Mikhail, DO     Allergies  Allergen Reactions  . Azithromycin Other (See Comments)    Nose bleeding event    Social History:  reports that she has never smoked. She has never used smokeless tobacco. She reports that she does not drink alcohol or use illicit drugs.    Family History  Problem Relation Age of Onset  . Diabetes Mother   . Kidney disease Mother        Physical Exam:  GEN:  Pleasant  Morbidly Obese 39 y.o. African American female examined and in no acute distress; cooperative with exam Filed Vitals:   02/04/15 1700 02/04/15 2032 02/04/15 2241  BP: 240/128 137/79 219/111  Pulse: 115 106 112  Temp: 98.8 F (37.1 C) 98.2 F (36.8 C)   TempSrc: Oral    Resp: 18 20 20   SpO2: 98% 100% 97%   Blood pressure 219/111, pulse 112, temperature 98.2 F (36.8 C), temperature source Oral, resp. rate 20, SpO2 97 %. PSYCH: She is alert and oriented x4; does not appear anxious does not appear depressed; affect is normal HEENT: Normocephalic and Atraumatic, Mucous membranes pink; PERRLA; EOM intact; Fundi:  Benign;  No scleral icterus, Nares: Patent, Oropharynx: Clear, Fair Dentition,    Neck:  FROM, No Cervical  Lymphadenopathy nor Thyromegaly or Carotid Bruit; No JVD; Breasts:: Not examined CHEST WALL: No tenderness CHEST: Normal respiration, clear to auscultation bilaterally HEART: Regular rate and rhythm; no murmurs rubs or gallops BACK: No kyphosis or scoliosis; No CVA tenderness ABDOMEN: Positive Bowel Sounds, Obese, Soft Non-Tender, No Rebound or Guarding; No Masses, No Organomegaly. Rectal Exam: Not done EXTREMITIES: 3+ Edema Genitalia: not examined PULSES: 2+ and symmetric SKIN: Normal hydration no rash or ulceration CNS:  Alert and Oriented x 4, No Focal Deficits Vascular: pulses palpable throughout    Labs on Admission:  Basic Metabolic Panel:  Recent Labs Lab 02/04/15 0032  NA 144  K 5.0  CL 112*  CO2 23  GLUCOSE 117*  BUN 24*  CREATININE 2.98*  CALCIUM 8.9   Liver Function Tests:  Recent Labs Lab 02/04/15 0032  AST 12*  ALT 11*  ALKPHOS 46  BILITOT 0.4  PROT 6.2*  ALBUMIN 2.3*   No results for input(s): LIPASE, AMYLASE  in the last 168 hours. No results for input(s): AMMONIA in the last 168 hours. CBC:  Recent Labs Lab 02/04/15 0032  WBC 6.5  NEUTROABS 3.6  HGB 9.9*  HCT 32.6*  MCV 87.2  PLT 261   Cardiac Enzymes:  Recent Labs Lab 02/04/15 0032  TROPONINI 0.05*    BNP (last 3 results)  Recent Labs  02/04/15 0031  BNP 349.2*    ProBNP (last 3 results) No results for input(s): PROBNP in the last 8760 hours.  CBG: No results for input(s): GLUCAP in the last 168 hours.  Radiological Exams on Admission: Dg Chest 2 View  02/05/2015  CLINICAL DATA:  39 year old female with shortness of breath and bilateral lower extremity swelling. EXAM: CHEST  2 VIEW COMPARISON:  Radiograph dated 10/26/2013 FINDINGS: Evaluation is limited due to soft tissue attenuation. Two views of the chest do not demonstrate focal consolidation. There is no pleural effusion or pneumothorax. - there is mild cardiomegaly. The osseous structures appear unremarkable.  IMPRESSION: No focal consolidation. Electronically Signed   By: Elgie Collard M.D.   On: 02/05/2015 00:04     EKG: Independently reviewed. Sinus Tachycardia rate =110     Assessment/Plan:      39 y.o. female with  Principal Problem:    Acute CHF (congestive heart failure) (HCC)   Cardiac Monitoring   Acute CHF Protocol    Diurese with Metolazone with IV Lasix 80 mg BID    Monitor Electrolytes and BUN/Cr   Active Problems:    Anasarca- due to Hypoalbuminemia   Nutrition Consult for protein needs   Check Prealbumin level      AKI (acute kidney injury) (HCC)   Monitor BUN/CR   May Need Renal consultation      Asthma   DuoNebs PRN      Hypoalbuminemia   Nutrition Consult for protein needs   Check Prealbumin level       Anemia due to other cause   Anemia Panel ordered      Morbid obesity (HCC)   Needs Weight loss    DVT Prophylaxis   Lovenox   Code Status:     FULL CODE   Family Communication:   Husband and Sister and Children at Bedside    Disposition Plan:    Inpatient Status        Time spent:  41 Minutes      Ron Parker [facility] Pager [phone]   If 7AM -7PM Please Contact the Day Rounding Team MD for [facility]  If 7PM-7AM, Please Contact Night-Floor Coverage  [email or web address] Password [email or web address] 02/05/2015, 1:59 AM     ADDENDUM:   Patient was seen and examined on 02/05/2015

## 2015-02-05 NOTE — ED Notes (Signed)
gingerale taken to patient per RN

## 2015-02-05 NOTE — Progress Notes (Signed)
  Echocardiogram 2D Echocardiogram has been performed.  Cathie BeamsGREGORY, Kaan Tosh 02/05/2015, 2:59 PM

## 2015-02-05 NOTE — Progress Notes (Signed)
[place] TEAM 1 - Stepdown/ICU TEAM PROGRESS NOTE  [name] ZOX:[id number] DOB: 07/26/1975 DOA: 02/04/2015 PCP: Lonia BloodGARBA,LAWAL, MD  Admit HPI / Brief Narrative: 39 y.o. female with a history of Asthma, HTN, DM2, and previous necrotizing fasciitis of the thigh who presented to the ED with complaints of worsening edema of both legs, along with SOB and DOE over 2-3 weeks. She reported taking her lasix and blood pressure medications.  In the ED her BUN/Cr was elevated at 24/2.98 - last documented creatinine ~1 year ago 0.76 to 1.4. She was administered a 40 mg dose of IV Lasix and given Albuterol Nebs and referred for admission.   HPI/Subjective: Pt seen for f/u visit.  Assessment/Plan:  Acute CHF exacerbation - anasarca  Acute kidney injury   DM2  Asthma  Hypoalbuminemia   Anemia  Extreme morbid obeisty - Body mass index is 70.66 kg/(m^2).  Code Status: FULL Family Communication:  Disposition Plan: SDU  Consultants: none  Procedures: none  Antibiotics: none  DVT prophylaxis: SQ heparin   Objective: Blood pressure 202/115, pulse 103, temperature 98.2 F (36.8 C), temperature source Oral, resp. rate 20, weight 180.9 kg (398 lb 13 oz), SpO2 99 %.  Intake/Output Summary (Last 24 hours) at 02/05/15 1156 Last data filed at 02/05/15 1000  Gross per 24 hour  Intake     60 ml  Output    550 ml  Net   -490 ml   Exam: Pt seen for f/u visit.  Data Reviewed: Basic Metabolic Panel:  Recent Labs Lab 02/04/15 0032  NA 144  K 5.0  CL 112*  CO2 23  GLUCOSE 117*  BUN 24*  CREATININE 2.98*  CALCIUM 8.9    CBC:  Recent Labs Lab 02/04/15 0032  WBC 6.5  NEUTROABS 3.6  HGB 9.9*  HCT 32.6*  MCV 87.2  PLT 261    Liver Function Tests:  Recent Labs Lab 02/04/15 0032  AST 12*  ALT 11*  ALKPHOS 46  BILITOT 0.4  PROT 6.2*  ALBUMIN 2.3*    Cardiac Enzymes:  Recent Labs Lab 02/04/15 0032  TROPONINI 0.05*    CBG:  Recent  Labs Lab 02/05/15 0358 02/05/15 0754  GLUCAP 113* 93    Recent Results (from the past 240 hour(s))  MRSA PCR Screening     Status: None   Collection Time: 02/05/15  3:47 AM  Result Value Ref Range Status   MRSA by PCR NEGATIVE NEGATIVE Final    Comment:        The GeneXpert MRSA Assay (FDA approved for NASAL specimens only), is one component of a comprehensive MRSA colonization surveillance program. It is not intended to diagnose MRSA infection nor to guide or monitor treatment for MRSA infections.      Studies:   Recent x-ray studies have been reviewed in detail by the Attending Physician  Scheduled Meds:  Scheduled Meds: . aspirin EC  325 mg Oral Daily  . atorvastatin  20 mg Oral q1800  . carvedilol  6.25 mg Oral BID WC  . furosemide  80 mg Intravenous BID  . gemfibrozil  600 mg Oral BID AC  . heparin  5,000 Units Subcutaneous 3 times per day  . insulin aspart  0-5 Units Subcutaneous QHS  . insulin aspart  0-9 Units Subcutaneous TID WC  . insulin glargine  15 Units Subcutaneous QHS  . ipratropium-albuterol  3 mL Nebulization Q4H  . metolazone  2.5 mg Oral BID  . nitroGLYCERIN  0.5 inch Topical 4  times per day  . sodium chloride  3 mL Intravenous Q12H    Time spent on care of this patient: No charge   Lonia Blood , MD   [facility] Office  [phone] Pager - Text Page per Amion as per below:  On-Call/Text Page:      [email or web address]      password [email or web address]  If 7PM-7AM, please contact night-coverage [email or web address] Password [email or web address] 02/05/2015, 11:56 AM   LOS: 0 days

## 2015-02-06 LAB — COMPREHENSIVE METABOLIC PANEL
ALT: 10 U/L — ABNORMAL LOW (ref 14–54)
AST: 11 U/L — AB (ref 15–41)
Albumin: 2 g/dL — ABNORMAL LOW (ref 3.5–5.0)
Alkaline Phosphatase: 39 U/L (ref 38–126)
Anion gap: 7 (ref 5–15)
BUN: 24 mg/dL — ABNORMAL HIGH (ref 6–20)
CHLORIDE: 111 mmol/L (ref 101–111)
CO2: 25 mmol/L (ref 22–32)
Calcium: 8.8 mg/dL — ABNORMAL LOW (ref 8.9–10.3)
Creatinine, Ser: 3.17 mg/dL — ABNORMAL HIGH (ref 0.44–1.00)
GFR, EST AFRICAN AMERICAN: 20 mL/min — AB (ref >60)
GFR, EST NON AFRICAN AMERICAN: 17 mL/min — AB (ref >60)
Glucose, Bld: 66 mg/dL (ref 65–99)
POTASSIUM: 4.1 mmol/L (ref 3.5–5.1)
Sodium: 143 mmol/L (ref 135–145)
Total Bilirubin: 0.5 mg/dL (ref 0.3–1.2)
Total Protein: 5.3 g/dL — ABNORMAL LOW (ref 6.5–8.1)

## 2015-02-06 LAB — GLUCOSE, CAPILLARY
GLUCOSE-CAPILLARY: 88 mg/dL (ref 65–99)
GLUCOSE-CAPILLARY: 118 mg/dL — AB (ref 65–99)
Glucose-Capillary: 72 mg/dL (ref 65–99)
Glucose-Capillary: 107 mg/dL — ABNORMAL HIGH (ref 65–99)

## 2015-02-06 LAB — CBC
HEMATOCRIT: 26.8 % — AB (ref 36.0–46.0)
HEMOGLOBIN: 8.4 g/dL — AB (ref 12.0–15.0)
MCH: 27.3 pg (ref 26.0–34.0)
MCHC: 31.3 g/dL (ref 30.0–36.0)
MCV: 87 fL (ref 78.0–100.0)
Platelets: 227 10*3/uL (ref 150–400)
RBC: 3.08 MIL/uL — AB (ref 3.87–5.11)
RDW: 15.2 % (ref 11.5–15.5)
WBC: 5.2 10*3/uL (ref 4.0–10.5)

## 2015-02-06 MED ORDER — OXYCODONE HCL 5 MG PO TABS: 5.0000 mg | ORAL_TABLET | ORAL | Status: DC | PRN

## 2015-02-06 MED ORDER — PRO-STAT SUGAR FREE PO LIQD
30.0000 mL | Freq: Every day | ORAL | Status: DC
  Administered 2015-02-06 – 2015-02-10 (×3): 30 mL via ORAL
  Filled 2015-02-06 (×3): qty 30

## 2015-02-06 MED ORDER — CARVEDILOL 25 MG PO TABS
25.0000 mg | ORAL_TABLET | Freq: Two times a day (BID) | ORAL | Status: DC
  Administered 2015-02-06 – 2015-02-10 (×8): 25 mg via ORAL
  Filled 2015-02-06 (×8): qty 1

## 2015-02-06 MED ORDER — TRAMADOL HCL 50 MG PO TABS: 50.0000 mg | ORAL_TABLET | Freq: Four times a day (QID) | ORAL | Status: DC | PRN

## 2015-02-06 MED ORDER — ISOSORB DINITRATE-HYDRALAZINE 20-37.5 MG PO TABS
1.0000 | ORAL_TABLET | Freq: Three times a day (TID) | ORAL | Status: DC
  Administered 2015-02-07: 1 via ORAL
  Filled 2015-02-06: qty 1

## 2015-02-06 NOTE — Progress Notes (Signed)
Initial Nutrition Assessment  DOCUMENTATION CODES:   Morbid obesity  INTERVENTION:   -Low sodium diet education provided (see education note for further details) -30 ml Prostat daily, each supplement provides 100 kcals and 15 grams protein  NUTRITION DIAGNOSIS:   Food and nutrition related knowledge deficit related to chronic illness as evidenced by per patient/family report.  GOAL:   Patient will meet greater than or equal to 90% of their needs  MONITOR:   PO intake, Labs, Weight trends, Skin, I & O's, Other (Comment)  REASON FOR ASSESSMENT:   Consult Assessment of nutrition requirement/status  ASSESSMENT:   [name] is a 39 y.o. female with a history of Asthma, HTN, DM2, and previous CVA who presents to the ED with complaints of worsening edema of both legs, along with SOB and DOE over the past 2-3 weeks. She reports taking lasix and blood pressure medications but the Lasix has not been working. In the ED her BUN?Cr was elevated at 24/2.98 and her last documented Creatinine 1 year ago was from 0.76 to 1.4. She was administered a 40 mg dose of IV Lasix and given Albuterol Nebs and referred for admission. She denies having chest pain.   Pt admitted with acute CHF.   Spoke with pt and multiple family members at bedside. All express extreme concern about pt's edema and are inquiring about nutrition therapy to manage this issue.   Pt denies any weight loss. Noted severe wt gain over > 1 year.   Pt reports poor appetite over the past 2 weeks. Per pt family, pt was consuming mostly liquids (sodas, tea, etc). She ate minimal solid foods during this time period, due to feeling poorly. Prior to 2 weeks ago, her appetite was very good. Her husband prepares meals for the household and was very forthcoming about the high calorie, high sodium food habits in their household. Typical meals consisted of highly processed items, such as canned vegetables and oodles of  noodles. Noted sweetened tea, pickles, and snack cakes at bedside. PTA, copious amounts of seasoning salt were added both during food preparation at at the table. Additionally, pt husband reports that they eat very large portions of food and admits to overestimating portion sizes.  Pt reports her appetite is returning. Meal completion 50-100%. Pt family reports that they have been trying to eat less fried foods, however, limiting sodium will be a challenge for pt, who has disliked lower sodium options that have been provided to her in the past.   Pt and family also asking about protein sources, as they were told to consume more supplemental protein. Discussed ways to include more protein in her diet. This RD also spent ~30 minutes obtaining history and educating pt and family members on low sodium diet; refer to education note for further details.   Nutrition-Focused physical exam completed. Findings are no fat depletion, no muscle depletion, and moderate to severe edema.   Albumin has a half-life of 21 days and is strongly affected by stress response and inflammatory process, therefore, do not expect to see an improvement in this lab value during acute hospitalization. When a patient presents with low albumin, it is likely skewed due to the acute inflammatory response.  Unless it is suspected that patient had poor PO intake or malnutrition prior to admission, then RD should not be consulted solely for low albumin. Note that low albumin is no longer used to diagnose malnutrition; [name] uses the new malnutrition guidelines published by the American Society  for Parenteral and Enteral Nutrition (A.S.P.E.N.) and the Academy of Nutrition and Dietetics (AND).    Labs reviewed: CBGS: 72-112.   Diet Order:  Diet Heart Room service appropriate?: Yes; Fluid consistency:: Thin  Skin:  Reviewed, no issues  Last BM:  02/06/15  Height:   Ht Readings from Last 1 Encounters:  02/06/15 5\' 4"  (1.626 m)     Weight:   Wt Readings from Last 1 Encounters:  02/06/15 399 lb 7.6 oz (181.2 kg)    Ideal Body Weight:  54.5 kg  BMI:  Body mass index is 68.54 kg/(m^2).  Estimated Nutritional Needs:   Kcal:  2000-2200  Protein:  95-110 grams  Fluid:  per MD  EDUCATION NEEDS:   Education needs addressed  [name], RD, LDN, CDE Pager: [phone] After hours Pager: [phone]

## 2015-02-06 NOTE — Care Management Note (Signed)
Case Management Note  Patient Details  Name: [name] MRN: [id number] Date of Birth: 06/18/1975  Subjective/Objective:        Adm w heart failure           Action/Plan:lives w husband, pcp dr Mikeal Hawthornegarba  Expected Discharge Date:                  Expected Discharge Plan:     In-House Referral:     Discharge planning Services     Post Acute Care Choice:    Choice offered to:     DME Arranged:    DME Agency:     HH Arranged:    HH Agency:     Status of Service:     Medicare Important Message Given:    Date Medicare IM Given:    Medicare IM give by:    Date Additional Medicare IM Given:    Additional Medicare Important Message give by:     If discussed at Long Length of Stay Meetings, dates discussed:    Additional Comments: ur review done  Hanley HaysDowell, Branton Einstein T, RN 02/06/2015, 10:40 AM

## 2015-02-06 NOTE — Progress Notes (Signed)
Patient moved to bariatric bed for comfort, and ease of movement.  Tolerated move without incident.

## 2015-02-06 NOTE — Progress Notes (Signed)
[place] TEAM 1 - Stepdown/ICU TEAM PROGRESS NOTE  Neva SeatDavenia Nevills WUJ:[id number] DOB: 02/04/1976 DOA: 02/04/2015 PCP: Lonia BloodGARBA,LAWAL, MD  Admit HPI / Brief Narrative: 39 y.o. female with a history of morbid obesity, Asthma, HTN, DM2, and previous necrotizing fasciitis of the thigh who presented to the ED with complaints of worsening edema of both legs, along with SOB and DOE over 2-3 weeks. She reported taking her lasix and blood pressure medications.  In the ED her BUN/Cr was elevated at 24/2.98 - last documented creatinine ~1 year ago 0.76 to 1.4. She was administered a 40 mg dose of IV Lasix and given Albuterol Nebs and referred for admission.   HPI/Subjective: The pt denies any new complaints.  She states her sob has improved.  She denies cp, n/v, or abdom pain.    Assessment/Plan:  Acute Diastolic CHF exacerbation - anasarca TTE notes EF 55-60% w/ no WMA and grade 1 DD - admit wgt 180.9kg - presently 181.2kg, but accuracy in question - net negative ~4L thus far - cont diuresis   Malignant HTN Overall BP control improved - avoid over aggressive correction - attempt to transition to oral meds today    Small mobile density on R coronary cusp AoV? 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  Code Status: FULL Family Communication: no family present at time of exam today  Disposition Plan: SDU  Consultants: none  Procedures: none  Antibiotics: none  DVT prophylaxis: SQ heparin   Objective: Blood pressure 166/83, pulse 83, temperature 98 F (36.7 C),  temperature source Oral, resp. rate 22, height 5\' 4"  (1.626 m), weight 181.2 kg (399 lb 7.6 oz), SpO2 98 %.  Intake/Output Summary (Last 24 hours) at 02/06/15 1537 Last data filed at 02/06/15 1500  Gross per 24 hour  Intake 1325.95 ml  Output   4650 ml  Net -3324.05 ml   Exam: General: No acute respiratory distress resting in bed  Lungs: Clear to auscultation bilaterally without wheezes or crackles - bs very distant  Cardiovascular: Regular rate and rhythm - heart sound very distant  Abdomen: Nontender, morbid obesity, soft, bowel sounds positive, no rebound, no ascites, no appreciable mass Extremities: No significant cyanosis, or clubbing;  3+ pitting edema bilateral lower extremities  Data Reviewed: Basic Metabolic Panel:  Recent Labs Lab 02/04/15 0032 02/06/15 0850  NA 144 143  K 5.0 4.1  CL 112* 111  CO2 23 25  GLUCOSE 117* 66  BUN 24* 24*  CREATININE 2.98* 3.17*  CALCIUM 8.9 8.8*    CBC:  Recent Labs Lab 02/04/15 0032 02/06/15 0850  WBC 6.5 5.2  NEUTROABS 3.6  --   HGB 9.9* 8.4*  HCT 32.6* 26.8*  MCV 87.2 87.0  PLT 261 227    Liver Function Tests:  Recent Labs Lab 02/04/15 0032 02/06/15 0850  AST 12* 11*  ALT 11* 10*  ALKPHOS 46 39  BILITOT 0.4 0.5  PROT 6.2* 5.3*  ALBUMIN 2.3* 2.0*    Cardiac Enzymes:  Recent Labs Lab 02/04/15 0032  TROPONINI  0.05*    CBG:  Recent Labs Lab 02/05/15 1123 02/05/15 1701 02/05/15 2133 02/06/15 0717 02/06/15 1124  GLUCAP 89 84 112* 72 88    Recent Results (from the past 240 hour(s))  MRSA PCR Screening     Status: None   Collection Time: 02/05/15  3:47 AM  Result Value Ref Range Status   MRSA by PCR NEGATIVE NEGATIVE Final    Comment:        The GeneXpert MRSA Assay (FDA approved for NASAL specimens only), is one component of a comprehensive MRSA colonization surveillance program. It is not intended to diagnose MRSA infection nor to guide or monitor treatment for MRSA infections.       Studies:   Recent x-ray studies have been reviewed in detail by the Attending Physician  Scheduled Meds:  Scheduled Meds: . arformoterol  15 mcg Nebulization Q12H  . aspirin EC  325 mg Oral Daily  . atorvastatin  20 mg Oral q1800  . budesonide  0.25 mg Nebulization Q12H  . carvedilol  12.5 mg Oral BID WC  . feeding supplement (PRO-STAT SUGAR FREE 64)  30 mL Oral Daily  . furosemide  80 mg Intravenous TID  . gemfibrozil  600 mg Oral BID AC  . heparin  5,000 Units Subcutaneous 3 times per day  . insulin aspart  0-5 Units Subcutaneous QHS  . insulin aspart  0-9 Units Subcutaneous TID WC  . insulin glargine  15 Units Subcutaneous QHS  . metolazone  2.5 mg Oral BID    Time spent on care of this patient: 35 mins   Bharath Bernstein T , MD   [facility] Office  [phone] Pager - Text Page per Loretha Stapler as per below:  On-Call/Text Page:      [email or web address]      password [email or web address]  If 7PM-7AM, please contact night-coverage [email or web address] Password [email or web address] 02/06/2015, 3:37 PM   LOS: 1 day

## 2015-02-06 NOTE — Plan of Care (Signed)
Problem: Food- and Nutrition-Related Knowledge Deficit (NB-1.1) Goal: Nutrition education Formal process to instruct or train a patient/client in a skill or to impart knowledge to help patients/clients voluntarily manage or modify food choices and eating behavior to maintain or improve health. Outcome: Adequate for Discharge Nutrition Education Note  RD consulted for nutrition education regarding new onset CHF.  RD provided "Low Sodium Nutrition Therapy" handout from the Academy of [employer]. Also provided plate method handout per pt husband request. Reviewed patient's dietary recall. Provided examples on ways to decrease sodium intake in diet. Discouraged intake of processed foods and use of salt shaker. Encouraged fresh fruits and vegetables as well as whole grain sources of carbohydrates to maximize fiber intake.   RD discussed why it is important for patient to adhere to diet recommendations, and emphasized the role of fluids, foods to avoid, and importance of weighing self daily. Teach back method used.  Expect fair compliance.  Body mass index is 68.54 kg/(m^2). Pt meets criteria for extreme obesity, class III based on current BMI.  Current diet order is Heart Healthy, patient is consuming approximately 50-100% of meals at this time. Labs and medications reviewed. RD contact information provided.   RD will continue to follow.   Marieli Rudy A. Mayford KnifeWilliams, RD, LDN, CDE Pager: [phone] After hours Pager: [phone]

## 2015-02-07 LAB — BASIC METABOLIC PANEL
Anion gap: 8 (ref 5–15)
BUN: 27 mg/dL — AB (ref 6–20)
CHLORIDE: 107 mmol/L (ref 101–111)
CO2: 25 mmol/L (ref 22–32)
CREATININE: 3.31 mg/dL — AB (ref 0.44–1.00)
Calcium: 8.4 mg/dL — ABNORMAL LOW (ref 8.9–10.3)
GFR, EST AFRICAN AMERICAN: 19 mL/min — AB (ref >60)
GFR, EST NON AFRICAN AMERICAN: 17 mL/min — AB (ref >60)
Glucose, Bld: 99 mg/dL (ref 65–99)
POTASSIUM: 4.1 mmol/L (ref 3.5–5.1)
SODIUM: 140 mmol/L (ref 135–145)

## 2015-02-07 LAB — GLUCOSE, CAPILLARY
GLUCOSE-CAPILLARY: 87 mg/dL (ref 65–99)
GLUCOSE-CAPILLARY: 98 mg/dL (ref 65–99)
GLUCOSE-CAPILLARY: 96 mg/dL (ref 65–99)
Glucose-Capillary: 92 mg/dL (ref 65–99)

## 2015-02-07 LAB — HEMOGLOBIN A1C
Hgb A1c MFr Bld: 7.8 % — ABNORMAL HIGH (ref 4.8–5.6)
Mean Plasma Glucose: 177 mg/dL

## 2015-02-07 MED ORDER — ISOSORB DINITRATE-HYDRALAZINE 20-37.5 MG PO TABS
1.0000 | ORAL_TABLET | Freq: Once | ORAL | Status: AC
  Administered 2015-02-07: 1 via ORAL
  Filled 2015-02-07: qty 1

## 2015-02-07 MED ORDER — ISOSORB DINITRATE-HYDRALAZINE 20-37.5 MG PO TABS
2.0000 | ORAL_TABLET | Freq: Three times a day (TID) | ORAL | Status: DC
  Administered 2015-02-07 – 2015-02-10 (×8): 2 via ORAL
  Filled 2015-02-07 (×8): qty 2

## 2015-02-07 NOTE — Progress Notes (Signed)
Patient refused to have labs drawn this morning, CBC and BMP.

## 2015-02-07 NOTE — Progress Notes (Signed)
    [facility] has been requested to perform a transesophageal echocardiogram on this patient because she had a possible small mobile density seen on her aortic valve.  After careful review of history and examination, the risks and benefits of transesophageal echocardiogram have been explained including risks of esophageal damage, perforation (1:10,000 risk), bleeding, pharyngeal hematoma as well as other potential complications associated with conscious sedation including aspiration, arrhythmia, respiratory failure and death. Alternatives to treatment were discussed, questions were answered. Patient is willing to proceed. She had a TEE in 08/2013 so she recalled having this done before. TEE orders written.  I sent a care order requesting the nurse discuss insulin with primary team - takes QHS insulin and they may want to adjust since she'll be NPO after midnight. TEE scheduled for tomorrow at noon with Dr. Duke Salviaandolph.  Laurann Montanaayna N Amiaya Mcneeley, PA-C 02/07/2015 3:10 PM

## 2015-02-07 NOTE — Progress Notes (Signed)
[facility] TEAM 1 - Stepdown/ICU TEAM PROGRESS NOTE  [name] [id number] DOB: 04/08/1975 DOA: 02/04/2015 PCP: Lonia BloodGARBA,LAWAL, MD  Admit HPI / Brief Narrative: 39 y.o. female with a history of morbid obesity, Asthma, HTN, DM2, and previous necrotizing fasciitis of the thigh who presented to the ED with complaints of worsening edema of both legs, along with SOB and DOE over 2-3 weeks. She reported taking her lasix and blood pressure medications.  In the ED her BUN/Cr was elevated at 24/2.98 - last documented creatinine ~1 year ago 0.76 to 1.4. She was administered a 40 mg dose of IV Lasix and given Albuterol Nebs and referred for admission.   HPI/Subjective: Pt has no complaints.  She is anxious to be d/c home.  She denies cp, n/v, abdom pain, or sob.  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  Code Status: FULL Family Communication: no family present at time of exam today  Disposition Plan: transfer to CHF floor - TEE per Cardiology - possible d/c home in 48-72hrs  depending upon extent of diuresis, and timing of TEE   Consultants: none  Procedures: TTE - 12/25 - results above TEE - pending   Antibiotics: none  DVT prophylaxis: SQ heparin   Objective: Blood pressure 184/88, pulse 81, temperature 99 F (37.2 C), temperature source Axillary, resp. rate 20, height 5\' 4"  (1.626 m), weight 181.2 kg (399 lb 7.6 oz), SpO2 97 %.  Intake/Output Summary (Last 24 hours) at 02/07/15 1050 Last data filed at 02/07/15 0600  Gross per 24 hour  Intake 786.15 ml  Output   4175 ml  Net -3388.85 ml   Exam: General: No acute respiratory distress at rest  Lungs: Without wheezes or crackles B- bs very distant making exam difficult  Cardiovascular: Regular rate and rhythm - heart sound very distant - no appreciable M Abdomen: Nontender, morbid obesity, soft, bowel sounds positive, no rebound, no ascites, no appreciable mass Extremities: No significant cyanosis, or clubbing;  2+ pitting edema bilateral lower extremities  Data Reviewed: Basic Metabolic Panel:  Recent Labs Lab 02/04/15 0032 02/06/15 0850  NA 144 143  K 5.0 4.1  CL 112* 111  CO2 23 25  GLUCOSE 117* 66  BUN 24* 24*  CREATININE 2.98* 3.17*  CALCIUM 8.9 8.8*    CBC:  Recent Labs Lab 02/04/15 0032 02/06/15 0850  WBC 6.5 5.2  NEUTROABS 3.6  --   HGB 9.9*  8.4*  HCT 32.6* 26.8*  MCV 87.2 87.0  PLT 261 227    Liver Function Tests:  Recent Labs Lab 02/04/15 0032 02/06/15 0850  AST 12* 11*  ALT 11* 10*  ALKPHOS 46 39  BILITOT 0.4 0.5  PROT 6.2* 5.3*  ALBUMIN 2.3* 2.0*    Cardiac Enzymes:  Recent Labs Lab 02/04/15 0032  TROPONINI 0.05*    CBG:  Recent Labs Lab 02/06/15 0717 02/06/15 1124 02/06/15 1632 02/06/15 2124 02/07/15 0813  GLUCAP 72 88 107* 118* 87    Recent Results (from the past 240 hour(s))  MRSA PCR Screening     Status: None   Collection Time: 02/05/15  3:47 AM  Result Value Ref Range Status   MRSA by PCR NEGATIVE NEGATIVE Final     Comment:        The GeneXpert MRSA Assay (FDA approved for NASAL specimens only), is one component of a comprehensive MRSA colonization surveillance program. It is not intended to diagnose MRSA infection nor to guide or monitor treatment for MRSA infections.      Studies:   Recent x-ray studies have been reviewed in detail by the Attending Physician  Scheduled Meds:  Scheduled Meds: . arformoterol  15 mcg Nebulization Q12H  . aspirin EC  325 mg Oral Daily  . atorvastatin  20 mg Oral q1800  . budesonide  0.25 mg Nebulization Q12H  . carvedilol  25 mg Oral BID WC  . feeding supplement (PRO-STAT SUGAR FREE 64)  30 mL Oral Daily  . furosemide  80 mg Intravenous TID  . gemfibrozil  600 mg Oral BID AC  . heparin  5,000 Units Subcutaneous 3 times per day  . insulin aspart  0-5 Units Subcutaneous QHS  . insulin aspart  0-9 Units Subcutaneous TID WC  . insulin glargine  15 Units Subcutaneous QHS  . isosorbide-hydrALAZINE  1 tablet Oral TID  . metolazone  2.5 mg Oral BID    Time spent on care of this patient: 35 mins   Loribeth Katich T , MD   [facility] Office  [phone] Pager - Text Page per Loretha Stapler as per below:  On-Call/Text Page:      [email or web address]      password [email or web address]  If 7PM-7AM, please contact night-coverage [email or web address] Password [email or web address] 02/07/2015, 10:50 AM   LOS: 2 days

## 2015-02-07 NOTE — Progress Notes (Signed)
Brief Education Follow-Up Note  RD received another consult for diet education. Note that [name] and family were educated extensively on 02/06/15 regarding CHF diet restrictions. See note for further details.   Spoke with [name] and husband at bedside and reinforced diet principles. [name]'s only question was recommendations for soup, due to canned soup being high in sodium. Discussed ways [name] and family could make homemade soup to decrease sodium content. [name] husband reports that he plans to prepare his entire household's meals the same ways as [name] to support her with diet compliance.   They both expressed appreciation for visit.   RD will continue to follow.   Pricilla Moehle A. Mayford KnifeWilliams, RD, LDN, CDE Pager: [phone] After hours Pager: [phone]

## 2015-02-08 ENCOUNTER — Encounter (HOSPITAL_COMMUNITY): Payer: Self-pay | Admitting: *Deleted

## 2015-02-08 ENCOUNTER — Inpatient Hospital Stay (HOSPITAL_COMMUNITY): Payer: Medicaid Other

## 2015-02-08 ENCOUNTER — Inpatient Hospital Stay (HOSPITAL_COMMUNITY)
Admit: 2015-02-08 | Discharge: 2015-02-08 | Disposition: A | Discharge status: Home / Self Care | Payer: Medicaid Other | Attending: Physician Assistant | Admitting: Physician Assistant

## 2015-02-08 ENCOUNTER — Encounter (HOSPITAL_COMMUNITY)
Admission: EM | Disposition: A | Discharge status: Home Health | Payer: Self-pay | Source: Home / Self Care | Attending: Internal Medicine

## 2015-02-08 DIAGNOSIS — I340 Nonrheumatic mitral (valve) insufficiency: Secondary | ICD-10-CM

## 2015-02-08 DIAGNOSIS — I359 Nonrheumatic aortic valve disorder, unspecified: Secondary | ICD-10-CM

## 2015-02-08 HISTORY — PX: TEE WITHOUT CARDIOVERSION: SHX5443

## 2015-02-08 LAB — CBC
HEMATOCRIT: 26.8 % — AB (ref 36.0–46.0)
Hemoglobin: 8.3 g/dL — ABNORMAL LOW (ref 12.0–15.0)
MCH: 26.7 pg (ref 26.0–34.0)
MCHC: 31 g/dL (ref 30.0–36.0)
MCV: 86.2 fL (ref 78.0–100.0)
PLATELETS: 229 10*3/uL (ref 150–400)
RBC: 3.11 MIL/uL — ABNORMAL LOW (ref 3.87–5.11)
RDW: 15 % (ref 11.5–15.5)
WBC: 5 10*3/uL (ref 4.0–10.5)

## 2015-02-08 LAB — PROTEIN / CREATININE RATIO, URINE
CREATININE, URINE: 43.87 mg/dL
PROTEIN CREATININE RATIO: 2.26 mg/mg{creat} — AB (ref 0.00–0.15)
Total Protein, Urine: 99 mg/dL

## 2015-02-08 LAB — BASIC METABOLIC PANEL
ANION GAP: 8 (ref 5–15)
BUN: 29 mg/dL — AB (ref 6–20)
CALCIUM: 8.3 mg/dL — AB (ref 8.9–10.3)
CO2: 26 mmol/L (ref 22–32)
Chloride: 107 mmol/L (ref 101–111)
Creatinine, Ser: 3.52 mg/dL — ABNORMAL HIGH (ref 0.44–1.00)
GFR calc Af Amer: 18 mL/min — ABNORMAL LOW (ref >60)
GFR calc non Af Amer: 15 mL/min — ABNORMAL LOW (ref >60)
GLUCOSE: 80 mg/dL (ref 65–99)
POTASSIUM: 4 mmol/L (ref 3.5–5.1)
Sodium: 141 mmol/L (ref 135–145)

## 2015-02-08 LAB — VITAMIN B12: Vitamin B-12: 481 pg/mL (ref 180–914)

## 2015-02-08 LAB — GLUCOSE, CAPILLARY
GLUCOSE-CAPILLARY: 78 mg/dL (ref 65–99)
GLUCOSE-CAPILLARY: 98 mg/dL (ref 65–99)
Glucose-Capillary: 57 mg/dL — ABNORMAL LOW (ref 65–99)
Glucose-Capillary: 86 mg/dL (ref 65–99)
Glucose-Capillary: 67 mg/dL (ref 65–99)
Glucose-Capillary: 74 mg/dL (ref 65–99)
Glucose-Capillary: 71 mg/dL (ref 65–99)
Glucose-Capillary: 103 mg/dL — ABNORMAL HIGH (ref 65–99)

## 2015-02-08 LAB — IRON AND TIBC
IRON: 15 ug/dL — AB (ref 28–170)
Saturation Ratios: 9 % — ABNORMAL LOW (ref 10.4–31.8)
TIBC: 175 ug/dL — AB (ref 250–450)
UIBC: 160 ug/dL

## 2015-02-08 LAB — RETICULOCYTES
RBC.: 3.11 MIL/uL — ABNORMAL LOW (ref 3.87–5.11)
RETIC CT PCT: 1.1 % (ref 0.4–3.1)
Retic Count, Absolute: 34.2 10*3/uL (ref 19.0–186.0)

## 2015-02-08 LAB — FOLATE: Folate: 4.7 ng/mL — ABNORMAL LOW (ref >5.9)

## 2015-02-08 LAB — FERRITIN: FERRITIN: 106 ng/mL (ref 11–307)

## 2015-02-08 SURGERY — ECHOCARDIOGRAM, TRANSESOPHAGEAL
Anesthesia: Moderate Sedation

## 2015-02-08 MED ORDER — FUROSEMIDE 10 MG/ML IJ SOLN
80.0000 mg | Freq: Two times a day (BID) | INTRAMUSCULAR | Status: DC
  Administered 2015-02-08 – 2015-02-10 (×4): 80 mg via INTRAVENOUS
  Filled 2015-02-08 (×4): qty 8

## 2015-02-08 MED ORDER — DEXTROSE 50 % IV SOLN
INTRAVENOUS | Status: AC
  Administered 2015-02-08: 50 mL via INTRAVENOUS
  Filled 2015-02-08: qty 50

## 2015-02-08 MED ORDER — DEXTROSE 50 % IV SOLN
INTRAVENOUS | Status: AC
  Filled 2015-02-08: qty 50

## 2015-02-08 MED ORDER — FENTANYL CITRATE (PF) 100 MCG/2ML IJ SOLN
INTRAMUSCULAR | Status: AC
  Filled 2015-02-08: qty 2

## 2015-02-08 MED ORDER — NALOXONE HCL 0.4 MG/ML IJ SOLN
INTRAMUSCULAR | Status: AC
  Filled 2015-02-08: qty 1

## 2015-02-08 MED ORDER — NALOXONE HCL 0.4 MG/ML IJ SOLN
INTRAMUSCULAR | Status: DC | PRN
  Administered 2015-02-08: 0.4 mg via INTRAVENOUS

## 2015-02-08 MED ORDER — FERROUS SULFATE 325 (65 FE) MG PO TABS
325.0000 mg | ORAL_TABLET | Freq: Three times a day (TID) | ORAL | Status: DC
  Administered 2015-02-08 – 2015-02-10 (×5): 325 mg via ORAL
  Filled 2015-02-08 (×5): qty 1

## 2015-02-08 MED ORDER — FENTANYL CITRATE (PF) 100 MCG/2ML IJ SOLN
INTRAMUSCULAR | Status: DC | PRN
  Administered 2015-02-08: 25 ug via INTRAVENOUS
  Administered 2015-02-08: 50 ug via INTRAVENOUS

## 2015-02-08 MED ORDER — MIDAZOLAM HCL 10 MG/2ML IJ SOLN
INTRAMUSCULAR | Status: DC | PRN
  Administered 2015-02-08 (×2): 2 mg via INTRAVENOUS

## 2015-02-08 MED ORDER — FUROSEMIDE 80 MG PO TABS: 80.0000 mg | ORAL_TABLET | Freq: Three times a day (TID) | ORAL | Status: DC

## 2015-02-08 MED ORDER — FUROSEMIDE 80 MG PO TABS: 80.0000 mg | ORAL_TABLET | Freq: Two times a day (BID) | ORAL | Status: DC

## 2015-02-08 MED ORDER — SODIUM CHLORIDE 0.9 % IV SOLN
INTRAVENOUS | Status: DC
  Administered 2015-02-08: 10:00:00 via INTRAVENOUS

## 2015-02-08 MED ORDER — DEXTROSE 50 % IV SOLN
25.0000 mL | Freq: Once | INTRAVENOUS | Status: AC
  Administered 2015-02-08: 50 mL via INTRAVENOUS

## 2015-02-08 MED ORDER — MIDAZOLAM HCL 5 MG/ML IJ SOLN
INTRAMUSCULAR | Status: AC
  Filled 2015-02-08: qty 2

## 2015-02-08 NOTE — H&P (View-Only) (Signed)
    [facility] has been requested to perform a transesophageal echocardiogram on this patient because she had a possible small mobile density seen on her aortic valve.  After careful review of history and examination, the risks and benefits of transesophageal echocardiogram have been explained including risks of esophageal damage, perforation (1:10,000 risk), bleeding, pharyngeal hematoma as well as other potential complications associated with conscious sedation including aspiration, arrhythmia, respiratory failure and death. Alternatives to treatment were discussed, questions were answered. Patient is willing to proceed. She had a TEE in 08/2013 so she recalled having this done before. TEE orders written.  I sent a care order requesting the nurse discuss insulin with primary team - takes QHS insulin and they may want to adjust since she'll be NPO after midnight. TEE scheduled for tomorrow at noon with Dr. Ridgeway.  Dayna N Dunn, PA-C 02/07/2015 3:10 PM   

## 2015-02-08 NOTE — Progress Notes (Signed)
Spoke with patient regarding taking Foley out. Pt states she is on Lasix and needs foley because she voids a lot. Pt educated on importance of taking foley out to reduce risk for infection.

## 2015-02-08 NOTE — CV Procedure (Signed)
Brief TEE Note  LVEF >55% Mild MR and TR. No vegetation or masses on the aortic valve. Prominent Node of Arantius on the aortic valve leaflet tip is likely what was seen on TTE.  For additional details please see full report.  Vicy Medico C. Duke Salviaandolph, MD, [facility]  02/08/2015 11:13 AM

## 2015-02-08 NOTE — Progress Notes (Signed)
Pt resting quietly. Pt alert and oriented and denies complaints at this time. Husband at bedside. Pt VSS on RA SATs 96%.

## 2015-02-08 NOTE — Consult Note (Signed)
Willow OraDavenia Signore Admit Date: 02/04/2015 02/08/2015 Arita MissSANFORD, Cerrone Debold B Requesting Physician:  Lafe GarinGherge MD  Reason for Consult:  Proteinuria, CKD3-4 HPI:  49F seen at request of Dr. Lafe GarinGherge for evaluation of CKD, Hypervolemia, Proteinuria.  Patient's pertinent past medical history includes morbid obesity with most recent BMI of 68.7, long-standing type 2 diabetes with unknown microvascular status, hypertension, GERD. Patient was admitted 4 days ago with dyspnea and peripheral edema. Has responded to diuretics with improved pulmonary status. Trend in serum creatinine is listed below. Urinalysis demonstrates heavy proteinuria and hematuria (I suspect Foley was serum present), with proteinuria being present for greater than one year.  Explore diet with the patient. She eats a high salt, high sugar diet.  [facility].   CREATININE (mg/dL)  Date Value  40/98/119108/07/2013 1.79*   CREATININE, SER (mg/dL)  Date Value  47/82/956212/28/2016 3.52*  02/07/2015 3.31*  02/06/2015 3.17*  02/04/2015 2.98*  10/26/2013 0.76  09/20/2013 1.12*  09/19/2013 1.21*  09/18/2013 1.43*  09/17/2013 1.64*  09/16/2013 1.59*  ] I/Os: I/O last 3 completed shifts: In: 1310 [P.O.:1040; I.V.:270] Out: 6600 [Urine:6600]  ROS Balance of 12 systems is negative w/ exceptions as above  PMH  Past Medical History  Diagnosis Date  . Morbid obesity (HCC)   . Hypertension   . CVA (cerebral infarction) 1990's    "writing is not the same since"  . Asthma   . Type II diabetes mellitus (HCC)   . GERD (gastroesophageal reflux disease)   . Stroke [facility](HCC)    PSH  Past Surgical History  Procedure Laterality Date  . Incision and drainage of wound Left 08/30/2013    thigh necrotic wound/notes 08/30/2013  . Irrigation and debridement abscess Left 08/30/2013    Procedure: IRRIGATION AND DEBRIDEMENT ABSCESS Left Thigh Necrotic Wound;  Surgeon: Axel FillerArmando Ramirez, MD;  Location: [facility] OR;  Service:  General;  Laterality: Left;  . Tee without cardioversion N/A 09/07/2013    Procedure: TRANSESOPHAGEAL ECHOCARDIOGRAM (TEE);  Surgeon: Chrystie NoseKenneth C. Hilty, MD;  [facility];  Service: Cardiovascular;  Laterality: N/A;   FH  Family History  Problem Relation Age of Onset  . Diabetes Mother   . Kidney disease Mother    SH  reports that she has never smoked. She has never used smokeless tobacco. She reports that she does not drink alcohol or use illicit drugs. Allergies  Allergies  Allergen Reactions  . Azithromycin Other (See Comments)    Nose bleeding event   Home medications Prior to Admission medications   Medication Sig Start Date End Date Taking? Authorizing Provider  albuterol (PROVENTIL HFA;VENTOLIN HFA) 108 (90 BASE) MCG/ACT inhaler Inhale 2 puffs into the lungs every 4 (four) hours as needed for wheezing or shortness of breath. 02/21/13  Yes Arthor CaptainAbigail Harris, PA-C  amLODipine (NORVASC) 10 MG tablet Take 1 tablet (10 mg total) by mouth daily. 09/20/13  Yes Richarda OverlieNayana Abrol, MD  arformoterol (BROVANA) 15 MCG/2ML NEBU Take 15 mcg by nebulization every 12 (twelve) hours. 09/09/13  Yes Maryann Mikhail, DO  aspirin EC 325 MG EC tablet Take 1 tablet (325 mg total) by mouth daily. 09/09/13  Yes Maryann Mikhail, DO  atorvastatin (LIPITOR) 20 MG tablet Take 1 tablet (20 mg total) by mouth daily at 6 PM. 09/09/13  Yes Maryann Mikhail, DO  budesonide (PULMICORT) 0.25 MG/2ML nebulizer solution Take 0.25 mg by nebulization every 12 (twelve) hours. 09/09/13  Yes Maryann Mikhail, DO  furosemide (LASIX) 40 MG tablet Take 40 mg  by mouth daily. 01/26/15  Yes Historical Provider, MD  gemfibrozil (LOPID) 600 MG tablet Take 1 tablet (600 mg total) by mouth 2 (two) times daily before a meal. 09/09/13  Yes Maryann Mikhail, DO  insulin aspart (NOVOLOG) 100 UNIT/ML injection Inject 0-20 Units into the skin 3 (three) times daily with meals. CBG < 70: implement hypoglycemia protocol CBG 70 - 120: 0 units CBG 121 -  150: 3 units CBG 151 - 200: 4 units CBG 201 - 250: 7 units CBG 251 - 300: 11 units CBG 301 - 350: 15 units CBG 351 - 400: 20 units 09/09/13  Yes Maryann Mikhail, DO  insulin glargine (LANTUS) 100 UNIT/ML injection Inject 0.15 mLs (15 Units total) into the skin daily. Patient taking differently: Inject 15 Units into the skin at bedtime.  09/20/13  Yes Richarda Overlie, MD  acetaminophen (TYLENOL) 325 MG tablet Take 650 mg by mouth every 6 (six) hours as needed for mild pain, fever or headache. 09/09/13   Edsel Petrin, DO    Current Medications Scheduled Meds: . arformoterol  15 mcg Nebulization Q12H  . aspirin EC  325 mg Oral Daily  . atorvastatin  20 mg Oral q1800  . budesonide  0.25 mg Nebulization Q12H  . carvedilol  25 mg Oral BID WC  . feeding supplement (PRO-STAT SUGAR FREE 64)  30 mL Oral Daily  . furosemide  80 mg Oral BID  . gemfibrozil  600 mg Oral BID AC  . heparin  5,000 Units Subcutaneous 3 times per day  . insulin aspart  0-5 Units Subcutaneous QHS  . insulin aspart  0-9 Units Subcutaneous TID WC  . insulin glargine  15 Units Subcutaneous QHS  . isosorbide-hydrALAZINE  2 tablet Oral TID   Continuous Infusions:  PRN Meds:.acetaminophen, ipratropium-albuterol, ondansetron (ZOFRAN) IV, oxyCODONE, traMADol  CBC  Recent Labs Lab 02/04/15 0032 02/06/15 0850 02/08/15 0352  WBC 6.5 5.2 5.0  NEUTROABS 3.6  --   --   HGB 9.9* 8.4* 8.3*  HCT 32.6* 26.8* 26.8*  MCV 87.2 87.0 86.2  PLT 261 227 229   Basic Metabolic Panel  Recent Labs Lab 02/04/15 0032 02/06/15 0850 02/07/15 1125 02/08/15 0352  NA 144 143 140 141  K 5.0 4.1 4.1 4.0  CL 112* 111 107 107  CO2 GLUCOSE 117* 66 99 80  BUN 24* 24* 27* 29*  CREATININE 2.98* 3.17* 3.31* 3.52*  CALCIUM 8.9 8.8* 8.4* 8.3*    Physical Exam  Blood pressure 144/73, pulse 68, temperature 97.6 F (36.4 C), temperature source Oral, resp. rate 20, height  (1.626 m), weight 180 kg (396 lb 13.3 oz), SpO2 99  %. 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 Morbid Obesity 3. Anasarca 4. Acute Diastolic HF Exacerbation 5. DM2 > 20y, unknown if has DR 6. HTN 7. Anemia, normocytic, low reticulocyte count, appears Fe deficient  Plan 1. Renal US 2. UP/C 3. C3 and C4, ANA, dsDNA, HIV, HCV, HBV sAg, ANCA, SPEP, sFLC 4. Agree with dietary consult for Na education 5. Needs to limit sweetened beverages/foods 6. Cont to diuresis, remains hypervolemic 7. PO  TID FeSO4 8. Only by meaningful changes in diet and weight will she have an optimal outcome Daily weights, Daily Renal Panel, Strict I/Os, Avoid nephrotoxins (NSAIDs, judicious IV Contrast)    Sabra Heck MD [phone] pgr 02/08/2015, 3:09 PM

## 2015-02-08 NOTE — Progress Notes (Signed)
  Echocardiogram Echocardiogram Transesophageal has been performed.  Arvil ChacoFoster, Phillippa Straub 02/08/2015, 11:36 AM

## 2015-02-08 NOTE — Progress Notes (Signed)
Physical Therapy Cancellation Note  Pt off unit for procedure. Will follow-up for physical therapy evaluation.  [address]Linda Mickle Secor Kis[place] Mills[place], [place] CarolinaPT [phone]  02/08/15 10:29

## 2015-02-08 NOTE — Interval H&P Note (Signed)
History and Physical Interval Note:  02/08/2015 10:27 AM  [name]  has presented today for surgery, with the diagnosis of endocarditis  The various methods of treatment have been discussed with the patient and family. After consideration of risks, benefits and other options for treatment, the patient has consented to  Procedure(s): TRANSESOPHAGEAL ECHOCARDIOGRAM (TEE) (N/A) as a surgical intervention .  The patient's history has been reviewed, patient examined, no change in status, stable for surgery.  I have reviewed the patient's chart and labs.  Questions were answered to the patient's satisfaction.     Madilyn Hookandolph, Andie Mungin P, MD 02/08/2015 10:27 AM

## 2015-02-08 NOTE — Progress Notes (Signed)
PROGRESS NOTE  [name] ZOX:[id number] DOB: 05/22/1975 DOA: 02/04/2015 PCP: Lonia BloodGARBA,LAWAL, MD  Admit HPI / Brief Narrative: 39 y.o. female with a history of morbid obesity, Asthma, HTN, DM2, and previous necrotizing fasciitis of the thigh who presented to the ED with complaints of worsening edema of both legs, along with SOB and DOE over 2-3 weeks. She reported taking her lasix and blood pressure medications.  In the ED her BUN/Cr was elevated at 24/2.98 - last documented creatinine ~1 year ago 0.76 to 1.4. She was administered a 40 mg dose of IV Lasix and given Albuterol Nebs and referred for admission.   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 - on 2D echo, TEE negative today per preliminary report  Acute kidney injury vs true CKD - Suspected HTN and DM related however patient with prior normal renal function - she is now stage IV, no significant improvement with diuresis, nephrology consulted.  - avoid ACE/ARB for now    DM2 - CBG currently well controlled - A1c 7.8   Asthma - Quiescent   Hypoalbuminemia   Anemia - Follow Hgb trend - no evidence of acute blood loss - felt to be due to CKD - check anemia panel   Extreme morbid obeisty - Body mass index is 68.08 kg/(m^2).  Code Status: FULL Family Communication: no family present at time of exam today  Disposition Plan: d/c home 1-2 days if felt stable by nephrology as well   Consultants: Nephrology   Procedures: TTE - 12/25 TEE - 12/28  Antibiotics: none  DVT prophylaxis: SQ heparin    Objective: Blood pressure 165/74, pulse 67, temperature 98.2 F (36.8 C), temperature source Oral, resp. rate 22, height 5\' 4"  (1.626 m), weight 180 kg (396 lb 13.3 oz), SpO2 99 %.  Intake/Output Summary (Last 24 hours) at 02/08/15 1212 Last data filed at 02/08/15 [id number]  Gross per 24 hour  Intake    830 ml  Output   4350 ml  Net  -3520 ml   Exam: General: No acute respiratory distress at rest  Lungs: Without wheezes or crackles B- bs very distant making exam difficult  Cardiovascular: Regular rate and rhythm - heart sound very distant - no appreciable M Abdomen: Nontender, morbid obesity, soft, bowel sounds positive, no rebound, no ascites, no appreciable mass Extremities: No significant cyanosis, or clubbing;  trace edema bilateral lower extremities, improved  Data Reviewed: Basic Metabolic Panel:  Recent Labs Lab 02/04/15 0032 02/06/15 0850 02/07/15 1125 02/08/15 0352  NA 144 143 140 141  K 5.0 4.1 4.1 4.0  CL 112* 111 107 107  CO2 23 25 25 26   GLUCOSE 117* 66 99 80  BUN 24* 24* 27* 29*  CREATININE 2.98* 3.17* 3.31* 3.52*  CALCIUM 8.9 8.8* 8.4* 8.3*    CBC:  Recent Labs Lab 02/04/15 0032 02/06/15 0850 02/08/15 0352  WBC 6.5 5.2 5.0  NEUTROABS 3.6  --   --   HGB 9.9* 8.4* 8.3*  HCT 32.6* 26.8* 26.8*  MCV 87.2 87.0 86.2  PLT 261  227 229    Liver Function Tests:  Recent Labs Lab 02/04/15 0032 02/06/15 0850  AST 12* 11*  ALT 11* 10*  ALKPHOS 46 39  BILITOT 0.4 0.5  PROT 6.2* 5.3*  ALBUMIN 2.3* 2.0*    Cardiac Enzymes:  Recent Labs Lab 02/04/15 0032  TROPONINI 0.05*    CBG:  Recent Labs Lab 02/08/15 0549 02/08/15 0634 02/08/15 0948 02/08/15 1032 02/08/15 1125  GLUCAP 57* 86 67 74 78    Recent Results (from the past 240 hour(s))  MRSA PCR Screening     Status: None   Collection Time: 02/05/15  3:47 AM  Result Value Ref Range Status   MRSA by PCR NEGATIVE NEGATIVE Final    Comment:        The GeneXpert MRSA Assay (FDA approved  for NASAL specimens only), is one component of a comprehensive MRSA colonization surveillance program. It is not intended to diagnose MRSA infection nor to guide or monitor treatment for MRSA infections.      Studies:   Recent x-ray studies have been reviewed in detail by the Attending Physician  Scheduled Meds:  Scheduled Meds: . [MAR Hold] arformoterol  15 mcg Nebulization Q12H  . [MAR Hold] aspirin EC  325 mg Oral Daily  . [MAR Hold] atorvastatin  20 mg Oral q1800  . [MAR Hold] budesonide  0.25 mg Nebulization Q12H  . [MAR Hold] carvedilol  25 mg Oral BID WC  . [MAR Hold] feeding supplement (PRO-STAT SUGAR FREE 64)  30 mL Oral Daily  . [MAR Hold] furosemide  80 mg Intravenous TID  . [MAR Hold] gemfibrozil  600 mg Oral BID AC  . [MAR Hold] heparin  5,000 Units Subcutaneous 3 times per day  . [MAR Hold] insulin aspart  0-5 Units Subcutaneous QHS  . [MAR Hold] insulin aspart  0-9 Units Subcutaneous TID WC  . [MAR Hold] insulin glargine  15 Units Subcutaneous QHS  . [MAR Hold] isosorbide-hydrALAZINE  2 tablet Oral TID  . [MAR Hold] metolazone  2.5 mg Oral BID    Pamella Pert , MD   [facility] Office  [phone] Pager - [phone]  If 7PM-7AM, please contact night-coverage [email or web address] Password [email or web address] 02/08/2015, 12:12 PM   LOS: 3 days

## 2015-02-08 NOTE — Progress Notes (Signed)
Brief Nutrition Follow-Up Note  Received another consult for diet education.   Paged by pt RN, who reports that pt is requesting handouts regarding low sodium diet. Noted pt and family have been educated extensively already; please see notes from 02/06/15 and 02/07/15. Pt has transferred from SDU to medical floor.   Spoke with pt and pt family again. They deny any further questions about diet. Pt reports that she is requesting "a list of foods I can eat when I go to the grocery store". Reviewed low sodium diet principles with family again and located "Recommended Foods" on handout and reviewed with pt and family. They deny any further questions and expressed appreciation for visit.   Provided from [facility]'s "Low Sodium Nutrition Therapy" and "Sodium Free Seasoning Tips" from [facility] Nutrition Care Manual as well as plate method handout.   Tonnya Garbett A. Mayford KnifeWilliams, RD, LDN, CDE Pager: [phone] After hours Pager: [phone]

## 2015-02-08 NOTE — Progress Notes (Signed)
Pt a/o, no c/o pain, pt had TEE done today, VSS, pt stable, husband at bedside, pts family requested info on diet, RD called and came up and gave pt and family handout regarding diet

## 2015-02-08 NOTE — Progress Notes (Signed)
Hypoglycemic Event  CBG: 57  Treatment: D50 IV 25 mL  Symptoms: dizzy  Follow-up CBG: DGUY:[id number]:0634 CBG Result:86  Possible Reasons for Event: Inadequate meal intake, pt NPO for procedure    Huel Coventryosenberger, Markella Dao A

## 2015-02-08 NOTE — Progress Notes (Signed)
OT Cancellation Note  Patient Details Name: Neva SeatDavenia Quigg MRN: [id number] DOB: 07/06/1975   Cancelled Treatment:    Reason Eval/Treat Not Completed: pt eating.  Will reattempt.   Angelene GiovanniConarpe, Melanye Hiraldo M  Esteven Overfelt [place], OTR/L [phone]  02/08/2015, 3:41 PM

## 2015-02-08 NOTE — Evaluation (Signed)
Occupational Therapy Evaluation Patient Details Name: [name] MRN: [id number] DOB: Mar 01, 1975 Today's Date: 02/08/2015    History of Present Illness 39 y.o. female admitted with Acute Diastolic CHF exacerbation - anasarca, malignant HTN, and Acute kidney injury vs true CKD. Hx of DM, asthma, and extreme morbid obesity.   Clinical Impression   Pt admitted with above. She demonstrates the below listed deficits and will benefit from continued OT to maximize safety and independence with BADLs.  Pt presents to OT with generalized weakness.   She currenty requires mod - total A for ADLs, but feel sh has the ability to do much more for herself.  Spouse and sister are highly supportive with spouse likely doing more for pt than is necessary.   Pt's goal is to be as independent as possible.  Feel she would benefit from CIR to maximize safety and independence with ADLs and for education with family.  Thus far, pt has not received HH therapies due to reimburser limitations. Will follow acutely.       Follow Up Recommendations  CIR;Supervision/Assistance - 24 hour    Equipment Recommendations  None recommended by OT    Recommendations for Other Services Rehab consult     Precautions / Restrictions Precautions Precautions: Fall Restrictions Weight Bearing Restrictions: No      Mobility Bed Mobility Overal bed mobility: Needs Assistance;+2 for physical assistance Bed Mobility: Sit to Supine     Supine to sit: Min assist;+2 for physical assistance Sit to supine: Min assist   General bed mobility comments: Pt requires assist to lift LEs onto the bed   Transfers Overall transfer level: Needs assistance Equipment used: Rolling walker (2 wheeled) Transfers: Sit to/from [employer] Sit to Stand: Min assist;+2 safety/equipment Stand pivot transfers: Min assist;+2 safety/equipment       General transfer comment: min A to boost into standing and to steady      Balance Overall balance assessment: Needs assistance Sitting-balance support: Feet supported Sitting balance-Leahy Scale: Fair     Standing balance support: Bilateral upper extremity supported Standing balance-Leahy Scale: Poor                              ADL Overall ADL's : Needs assistance/impaired Eating/Feeding: Independent;Sitting   Grooming: Wash/dry hands;Wash/dry face;Oral care;Set up;Sitting   Upper Body Bathing: Maximal assistance;Sitting   Lower Body Bathing: Total assistance;Bed level   Upper Body Dressing : Moderate assistance;Sitting   Lower Body Dressing: Total assistance;Sit to/from stand   Toilet Transfer: Minimal assistance;+2 for safety/equipment;RW;Ambulation;BSC   Toileting- Clothing Manipulation and Hygiene: Total assistance;Sit to/from stand       Functional mobility during ADLs: Minimal assistance;+2 for physical assistance;Rolling walker General ADL Comments: Family is very supportive      Advice worker      Pertinent Vitals/Pain Pain Assessment: No/denies pain Pain Score:  ("a little sore in my legs" no value given) Pain Location: LEs Pain Descriptors / Indicators: Sore Pain Intervention(s): Monitored during session;Repositioned     Hand Dominance Right   Extremity/Trunk Assessment Upper Extremity Assessment Upper Extremity Assessment: Generalized weakness   Lower Extremity Assessment Lower Extremity Assessment: Defer to PT evaluation       Communication Communication Communication: No difficulties   Cognition Arousal/Alertness: Awake/alert Behavior During Therapy: WFL for tasks assessed/performed Overall Cognitive Status: Within Functional Limits for tasks assessed  General Comments       Exercises Exercises: General Lower Extremity     Shoulder Instructions      Home Living Family/patient expects to be discharged to:: Private residence Living  Arrangements: Spouse/significant other Available Help at Discharge: Family;Available 24 hours/day Type of Home: Apartment Home Access: Stairs to enter Entrance Stairs-Number of Steps: flight Entrance Stairs-Rails: Right Home Layout: Two level (lives on 2nd floor) Alternate Level Stairs-Number of Steps: lives on 2nd floor, full flight to get there   Bathroom Shower/Tub: Chief Strategy OfficerTub/shower unit   Bathroom Toilet: Standard     Home Equipment: Bedside commode;Walker - 2 wheels   Additional Comments: Pt has 5, 3, and 2y.o. children       Prior Functioning/Environment Level of Independence: Needs assistance  Gait / Transfers Assistance Needed: Walks short distances in home, uses wheelchair typically. ADL's / Homemaking Assistance Needed: Husband fully bathes pt and assists with bathing as well as toileting.  Sister also assists with care as well as child care, grocery shopping, etc.         OT Diagnosis: Generalized weakness   OT Problem List: Decreased strength;Decreased activity tolerance;Impaired balance (sitting and/or standing);Decreased knowledge of use of DME or AE;Decreased knowledge of precautions;Obesity   OT Treatment/Interventions: Self-care/ADL training;Therapeutic exercise;DME and/or AE instruction;Therapeutic activities;Patient/family education;Balance training    OT Goals(Current goals can be found in the care plan section) Acute Rehab OT Goals Patient Stated Goal: To be as independent as possible  OT Goal Formulation: With patient/family Time For Goal Achievement: 02/22/15 Potential to Achieve Goals: Good ADL Goals Pt Will Perform Grooming: with min guard assist;standing Pt Will Perform Upper Body Bathing: sitting;with min assist;with adaptive equipment Pt Will Perform Lower Body Bathing: with mod assist;with adaptive equipment;sit to/from stand Pt Will Perform Upper Body Dressing: with supervision;sitting Pt Will Perform Lower Body Dressing: with mod assist;with  adaptive equipment;sit to/from stand Pt Will Transfer to Toilet: with min guard assist;ambulating;bedside commode;regular height toilet;grab bars Pt/caregiver will Perform Home Exercise Program: Increased strength;Right Upper extremity;Left upper extremity;With Supervision;With theraband;With written HEP provided  OT Frequency: Min 3X/week   Barriers to D/C:            Co-evaluation              End of Session Equipment Utilized During Treatment: Rolling walker Nurse Communication: Mobility status  Activity Tolerance: Patient tolerated treatment well Patient left: in bed;with call bell/phone within reach;with family/visitor present   Time: [id number] OT Time Calculation (min): 23 min Charges:  OT General Charges $OT Visit: 1 Procedure OT Evaluation $Initial OT Evaluation Tier I: 1 Procedure OT Treatments $Therapeutic Activity: 8-22 mins G-Codes:    Kimmerly Lora M 02/08/2015, 4:27 PM

## 2015-02-08 NOTE — Progress Notes (Signed)
Hypoglycemic Event  CBG: 67  Treatment: D50 IV 25 mL  Symptoms: None  Follow-up CBG: Time:74   CBG Result74  Possible Reasons for Event: Inadequate meal intake  Comments/MD notified    Johnna AcostaStuckey, Nari Vannatter Schuler

## 2015-02-08 NOTE — Progress Notes (Signed)
Rehab Admissions Coordinator Note:  Patient was screened by Trish MageLogue, Griff Badley M for appropriateness for an Inpatient Acute Rehab Consult.  Noted PT/OT recommending CIR.  At this time, we are recommending Inpatient Rehab consult.  Trish MageLogue, Khai Arrona M 02/08/2015, 4:48 PM  I can be reached at [phone].

## 2015-02-08 NOTE — Evaluation (Addendum)
Physical Therapy Evaluation Patient Details Name: [name] MRN: [id number] DOB: 07/08/1975 Today's Date: 02/08/2015   History of Present Illness  39 y.o. female admitted with Acute Diastolic CHF exacerbation - anasarca, malignant HTN, and Acute kidney injury vs true CKD. Hx of DM, asthma, and extreme morbid obesity.  Clinical Impression  Pt admitted with the above complications. Pt currently with functional limitations due to the deficits listed below (see PT Problem List). Limited by fatigue but eager to work with therapy this afternoon. Family present and very supportive. Able to ambulate short distance with min assist demonstrating some difficulty with RW control and instability but without buckling of LEs. Tolerate exercises well. [facility] as pt will have a full flight of stairs to navigate in order to enter her apartment and I do not feel [name] currently has the strength to safely perform this task at this time. Pt will benefit from skilled PT to increase their independence and safety with mobility to allow discharge to the venue listed below.       Follow Up Recommendations CIR    Equipment Recommendations  None recommended by PT    Recommendations for Other Services Rehab consult     Precautions / Restrictions Precautions Precautions: Fall Restrictions Weight Bearing Restrictions: No      Mobility  Bed Mobility Overal bed mobility: Needs Assistance;+2 for physical assistance Bed Mobility: Supine to Sit     Supine to sit: Min assist;+2 for physical assistance     General bed mobility comments: Min assist +2 for patient to pull through PTs hand. Able to bring LEs off of bed without assist. Cues for technique  Transfers Overall transfer level: Needs assistance Equipment used: Rolling walker (2 wheeled) Transfers: Sit to/from Stand Sit to Stand: Min assist;+2 safety/equipment          General transfer comment: Min assist for boost. VC for anterior weight-shift. Wide stance upon standing with hands on RW for support.  Ambulation/Gait Ambulation/Gait assistance: Min assist;+2 safety/equipment Ambulation Distance (Feet): 20 Feet Assistive device: Rolling walker (2 wheeled) Gait Pattern/deviations: Step-through pattern;Decreased stride length;Wide base of support;Drifts right/left Gait velocity: decreased Gait velocity interpretation: Below normal speed for age/gender General Gait Details: Min assist for walker control with tolerance to short distance in room. Easily fatigued. Cues for walker placement for proximity and correction with steering. No buckling noted although intermittently unsteady using RW.  Stairs            Wheelchair Mobility    Modified Rankin (Stroke Patients Only)       Balance Overall balance assessment: Needs assistance Sitting-balance support: No upper extremity supported;Feet supported Sitting balance-Leahy Scale: Fair     Standing balance support: Bilateral upper extremity supported Standing balance-Leahy Scale: Poor                               Pertinent Vitals/Pain Pain Assessment: 0-10 Pain Score:  ("a little sore in my legs" no value given) Pain Location: LEs Pain Descriptors / Indicators: Sore Pain Intervention(s): Monitored during session;Repositioned    Home Living Family/patient expects to be discharged to:: Private residence Living Arrangements: Spouse/significant other Available Help at Discharge: Family;Available 24 hours/day Type of Home: Apartment Home Access: Stairs to enter Entrance Stairs-Rails: Right Entrance Stairs-Number of Steps: flight Home Layout: Two level (lives on 2nd floor) Home Equipment: Bedside commode;Walker - 2 wheels  Prior Function Level of Independence: Needs assistance   Gait / Transfers Assistance Needed: Walks short distances in home, uses wheelchair  typically.  ADL's / Homemaking Assistance Needed: Husband helps with bath/dress ADLs        Hand Dominance        Extremity/Trunk Assessment   Upper Extremity Assessment: Defer to OT evaluation           Lower Extremity Assessment: Generalized weakness         Communication   Communication: No difficulties  Cognition Arousal/Alertness: Awake/alert Behavior During Therapy: WFL for tasks assessed/performed Overall Cognitive Status: Within Functional Limits for tasks assessed                      General Comments General comments (skin integrity, edema, etc.): Husband and sister in room. Both very supportive of patient. Sister concerned that pt does not receive enough physical therapy at home and worries that [name] will become worse once she returns home.    Exercises General Exercises - Lower Extremity Ankle Circles/Pumps: AROM;Both;15 reps;Seated Short Arc Quad: Strengthening;Both;10 reps;Seated Heel Slides: AAROM;Strengthening;Both;10 reps      Assessment/Plan    PT Assessment Patient needs continued PT services  PT Diagnosis Abnormality of gait;Difficulty walking;Generalized weakness   PT Problem List Decreased strength;Decreased activity tolerance;Decreased balance;Decreased range of motion;Decreased mobility;Decreased knowledge of use of DME;Cardiopulmonary status limiting activity;Obesity  PT Treatment Interventions DME instruction;Gait training;Stair training;Functional mobility training;Therapeutic activities;Therapeutic exercise;Balance training;Neuromuscular re-education;Patient/family education   PT Goals (Current goals can be found in the Care Plan section) Acute Rehab PT Goals Patient Stated Goal: Get around better PT Goal Formulation: With patient Time For Goal Achievement: 02/22/15 Potential to Achieve Goals: Good    Frequency Min 3X/week   Barriers to discharge Inaccessible home environment Flight of stairs to enter home     Co-evaluation               End of Session Equipment Utilized During Treatment: Gait belt Activity Tolerance: Patient limited by fatigue Patient left: in chair;with call bell/phone within reach;with family/visitor present (MD in room) Nurse Communication: Mobility status         Time: 1610-9604 PT Time Calculation (min) (ACUTE ONLY): 24 min   Charges:   PT Evaluation $Initial PT Evaluation Tier I: 1 Procedure PT Treatments $Therapeutic Activity: 8-22 mins   PT G CodesBerton Mount 02/08/2015, 3:44 PM  [place], [place] [phone]

## 2015-02-09 ENCOUNTER — Encounter (HOSPITAL_COMMUNITY): Payer: Self-pay | Admitting: Cardiovascular Disease

## 2015-02-09 DIAGNOSIS — I5031 Acute diastolic (congestive) heart failure: Secondary | ICD-10-CM

## 2015-02-09 DIAGNOSIS — N184 Chronic kidney disease, stage 4 (severe): Secondary | ICD-10-CM

## 2015-02-09 DIAGNOSIS — R5381 Other malaise: Secondary | ICD-10-CM

## 2015-02-09 LAB — MPO/PR-3 (ANCA) ANTIBODIES: ANCA Proteinase 3: <3.5 U/mL (ref 0.0–3.5)

## 2015-02-09 LAB — ANTI-DNA ANTIBODY, DOUBLE-STRANDED: DS DNA AB: 1 [IU]/mL (ref 0–9)

## 2015-02-09 LAB — ANCA TITERS
Atypical P-ANCA titer: <1:20 {titer}
C-ANCA: <1:20 {titer}
P-ANCA: <1:20 {titer}

## 2015-02-09 LAB — GLUCOSE, CAPILLARY
GLUCOSE-CAPILLARY: 81 mg/dL (ref 65–99)
GLUCOSE-CAPILLARY: 74 mg/dL (ref 65–99)
Glucose-Capillary: 70 mg/dL (ref 65–99)
Glucose-Capillary: 97 mg/dL (ref 65–99)

## 2015-02-09 LAB — KAPPA/LAMBDA LIGHT CHAINS
Kappa free light chain: 89.68 mg/L — ABNORMAL HIGH (ref 3.30–19.40)
Kappa, lambda light chain ratio: 2.54 — ABNORMAL HIGH (ref 0.26–1.65)
Lambda free light chains: 35.35 mg/L — ABNORMAL HIGH (ref 5.71–26.30)

## 2015-02-09 LAB — HIV ANTIBODY (ROUTINE TESTING W REFLEX): HIV Screen 4th Generation wRfx: NONREACTIVE

## 2015-02-09 LAB — HEPATITIS C ANTIBODY

## 2015-02-09 LAB — HEPATITIS B SURFACE ANTIGEN: HEP B S AG: NEGATIVE

## 2015-02-09 LAB — ANTINUCLEAR ANTIBODIES, IFA: ANTINUCLEAR ANTIBODIES, IFA: NEGATIVE

## 2015-02-09 LAB — C3 COMPLEMENT: C3 Complement: 146 mg/dL (ref 82–167)

## 2015-02-09 LAB — C4 COMPLEMENT: Complement C4, Body Fluid: 46 mg/dL — ABNORMAL HIGH (ref 14–44)

## 2015-02-09 LAB — PARATHYROID HORMONE, INTACT (NO CA): PTH: 57 pg/mL (ref 15–65)

## 2015-02-09 NOTE — OR Nursing (Signed)
Patient sedated with 75 mcg fentanyl and 4 mg of versed.  O2 saturation dropped to 80%.  Patient stimulated and oxygen increased to 5 L [place] 0.4 mg of Narcan given.  Saturations improved and Tee completed without further incident. Patient recovered without complications

## 2015-02-09 NOTE — Progress Notes (Signed)
Inpatient Rehabilitation  Met with patient and husband to discuss IP Rehab as an option for post acute therapy; however, husband stated that he wants to take patient home on Friday and care for her himself.  He reports not being open to IP Rehab at this time.  Discussed this with MD and RN CM; asked they to notify us if anything changes.    Carmelia Roller., CCC/SLP Admission Coordinator  [place]  Cell [phone]

## 2015-02-09 NOTE — Consult Note (Signed)
Physical Medicine and Rehabilitation Consult  Reason for Consult: Debility Referring Physician:  Dr. Lafe Garin   HPI: [name] is a 39 y.o. female with history of morbid obesity, Necrotizing fascitis left thigh, HTN, DM type 2, prior CVA  who was admitted on 02/04/15 with SOB, DOE and renal failure. She was treated with diuresis and 2 D echo done revealing EF 55-60% and question of small mobile mass on right aortic valve. Cardiology consulted for TEE and this was negative for vegetation or masses. She has had worsening of renal status due to acute on chronic renal failure felt to be duet to nephrotic syndrome and CKD$ per Dr. Marisue Humble. He recommended continuation of diuresis, aviodance of nephrotoxins, weight loss and education on dietary restrictions. PT/OT evaluations done yesterday and patient noted to be deconditioned. CIR recommended for follow up therapy.   Patient indicates that she no longer has shortness of breath. She has constipation. She is voiding frequently. She states she needs to get home to take care of her daughter. Patient's husband states that he can assist her at home and there is also a sister who is available to help as well. ROS  Denies chest pain, shortness of breath, nausea, Vomiting, diarrhea, No joint or limb pain, no back or neck pain No itching or rash, no depression or anxiety  Past Medical History  Diagnosis Date  . Morbid obesity (HCC)   . Hypertension   . CVA (cerebral infarction) 1990's    "writing is not the same since"  . Asthma   . Type II diabetes mellitus (HCC)   . GERD (gastroesophageal reflux disease)   . Stroke [facility])     Past Surgical History  Procedure Laterality Date  . Incision and drainage of wound Left 08/30/2013    thigh necrotic wound/notes 08/30/2013  . Irrigation and debridement abscess Left 08/30/2013    Procedure: IRRIGATION AND DEBRIDEMENT ABSCESS Left Thigh Necrotic Wound;  Surgeon: [name], MD;  Location:  [facility] OR;  Service: General;  Laterality: Left;  . Tee without cardioversion N/A 09/07/2013    Procedure: TRANSESOPHAGEAL ECHOCARDIOGRAM (TEE);  Surgeon: [name], MD;  Location: [facility] ENDOSCOPY;  Service: Cardiovascular;  Laterality: N/A;  . Tee without cardioversion N/A 02/08/2015    Procedure: TRANSESOPHAGEAL ECHOCARDIOGRAM (TEE);  Surgeon: Chilton Si, MD;  Location: [facility] ENDOSCOPY;  Service: Cardiovascular;  Laterality: N/A;    Family History  Problem Relation Age of Onset  . Diabetes Mother   . Kidney disease Mother     Social History: Lives with family. Needed assistance with ADL and ambulated short distances at home--used wheelchair for most mobility. She  reports that she has never smoked. She has never used smokeless tobacco. She reports that she does not drink alcohol or use illicit drugs.     Allergies  Allergen Reactions  . Azithromycin Other (See Comments)    Nose bleeding event   Medications Prior to Admission  Medication Sig Dispense Refill  . albuterol (PROVENTIL HFA;VENTOLIN HFA) 108 (90 BASE) MCG/ACT inhaler Inhale 2 puffs into the lungs every 4 (four) hours as needed for wheezing or shortness of breath.    Marland Kitchen amLODipine (NORVASC) 10 MG tablet Take 1 tablet (10 mg total) by mouth daily. 30 tablet 2  . arformoterol (BROVANA) 15 MCG/2ML NEBU Take 15 mcg by nebulization every 12 (twelve) hours.    Marland Kitchen aspirin EC 325 MG EC tablet Take 1 tablet (325 mg total) by mouth daily. 30 tablet 0  .  atorvastatin (LIPITOR) 20 MG tablet Take 1 tablet (20 mg total) by mouth daily at 6 PM.    . budesonide (PULMICORT) 0.25 MG/2ML nebulizer solution Take 0.25 mg by nebulization every 12 (twelve) hours.    . furosemide (LASIX) 40 MG tablet Take 40 mg by mouth daily.  0  . gemfibrozil (LOPID) 600 MG tablet Take 1 tablet (600 mg total) by mouth 2 (two) times daily before a meal.    . insulin aspart (NOVOLOG) 100 UNIT/ML injection Inject 0-20 Units into the skin 3 (three) times daily with  meals. CBG < 70: implement hypoglycemia protocol CBG 70 - 120: 0 units CBG 121 - 150: 3 units CBG 151 - 200: 4 units CBG 201 - 250: 7 units CBG 251 - 300: 11 units CBG 301 - 350: 15 units CBG 351 - 400: 20 units    . insulin glargine (LANTUS) 100 UNIT/ML injection Inject 0.15 mLs (15 Units total) into the skin daily. (Patient taking differently: Inject 15 Units into the skin at bedtime. ) 10 mL 11  . acetaminophen (TYLENOL) 325 MG tablet Take 650 mg by mouth every 6 (six) hours as needed for mild pain, fever or headache.      Home: Home Living Family/patient expects to be discharged to:: Private residence Living Arrangements: Spouse/significant other Available Help at Discharge: Family, Available 24 hours/day Type of Home: Apartment Home Access: Stairs to enter Secretary/administrator of Steps: flight Entrance Stairs-Rails: Right Home Layout: Two level (lives on 2nd floor) Alternate Level Stairs-Number of Steps: lives on 2nd floor, full flight to get there Bathroom Shower/Tub: Engineer, manufacturing systems: Standard Home Equipment: Arts development officer, Environmental consultant - 2 wheels Additional Comments: Pt has 5, 3, and 2y.o. children   Functional History: Prior Function Level of Independence: Needs assistance Gait / Transfers Assistance Needed: Walks short distances in home, uses wheelchair typically. ADL's / Homemaking Assistance Needed: Husband fully bathes pt and assists with bathing as well as toileting.  Sister also assists with care as well as child care, grocery shopping, etc.  Functional Status:  Mobility: Bed Mobility Overal bed mobility: Needs Assistance, +2 for physical assistance Bed Mobility: Sit to Supine Supine to sit: Min assist, +2 for physical assistance Sit to supine: Min assist General bed mobility comments: Pt requires assist to lift LEs onto the bed  Transfers Overall transfer level: Needs assistance Equipment used: Rolling walker (2 wheeled) Transfers: Sit to/from  Stand, Stand Pivot Transfers Sit to Stand: Min assist, +2 safety/equipment Stand pivot transfers: Min assist, +2 safety/equipment General transfer comment: min A to boost into standing and to steady  Ambulation/Gait Ambulation/Gait assistance: Min assist, +2 safety/equipment Ambulation Distance (Feet): 20 Feet Assistive device: Rolling walker (2 wheeled) Gait Pattern/deviations: Step-through pattern, Decreased stride length, Wide base of support, Drifts right/left General Gait Details: Min assist for walker control with tolerance to short distance in room. Easily fatigued. Cues for walker placement for proximity and correction with steering. No buckling noted although intermittently unsteady using RW. Gait velocity: decreased Gait velocity interpretation: Below normal speed for age/gender    ADL: ADL Overall ADL's : Needs assistance/impaired Eating/Feeding: Independent, Sitting Grooming: Wash/dry hands, Wash/dry face, Oral care, Set up, Sitting Upper Body Bathing: Maximal assistance, Sitting Lower Body Bathing: Total assistance, Bed level Upper Body Dressing : Moderate assistance, Sitting Lower Body Dressing: Total assistance, Sit to/from stand Toilet Transfer: Minimal assistance, +2 for safety/equipment, RW, Ambulation, BSC Toileting- Clothing Manipulation and Hygiene: Total assistance, Sit to/from stand Functional mobility during ADLs: Minimal  assistance, +2 for physical assistance, Rolling walker General ADL Comments: Family is very supportive   Cognition: Cognition Overall Cognitive Status: Within Functional Limits for tasks assessed Orientation Level: Oriented X4 Cognition Arousal/Alertness: Awake/alert Behavior During Therapy: WFL for tasks assessed/performed Overall Cognitive Status: Within Functional Limits for tasks assessed  Blood pressure 134/66, pulse 65, temperature 97.6 F (36.4 C), temperature source Oral, resp. rate 18, height  (1.626 m), weight 169.736 kg  (374 lb 3.2 oz), SpO2 95 %. Physical Exam  Nursing note and vitals reviewed. Constitutional: She is oriented to person, place, and time. She appears well-developed.  Morbid obesity  HENT:  Head: Normocephalic and atraumatic.  Eyes: Conjunctivae and EOM are normal. Pupils are equal, round, and reactive to light.  Neck: Normal range of motion. Neck supple.  Cardiovascular: Normal rate, regular rhythm and normal heart sounds.   No evidence of edema pretibial or pedal  Respiratory: Effort normal and breath sounds normal.  GI: Soft. Bowel sounds are normal.  Musculoskeletal:  No pain with upper or lower extremity range of motion  Neurological: She is alert and oriented to person, place, and time. She displays no atrophy. She exhibits normal muscle tone.  Motor strength is 4/5 bilateral deltoid, bicep, tricep, grip 4 minus at the hip flexors and knee extensors 4 at the ankle dorsiflexors and plantar flexors  Sensation intact in the upper and lower limbs  Skin: Skin is warm and dry.  Psychiatric: She has a normal mood and affect. Her behavior is normal.    Results for orders placed or performed during the hospital encounter of 02/04/15 (from the past 24 hour(s))  Glucose, capillary     Status: None   Collection Time: 02/08/15  9:48 AM  Result Value Ref Range   Glucose-Capillary 67 65 - 99 mg/dL  Glucose, capillary     Status: None   Collection Time: 02/08/15 10:32 AM  Result Value Ref Range   Glucose-Capillary 74 65 - 99 mg/dL  Glucose, capillary     Status: None   Collection Time: 02/08/15 11:25 AM  Result Value Ref Range   Glucose-Capillary 78 65 - 99 mg/dL  Glucose, capillary     Status: None   Collection Time: 02/08/15 12:39 PM  Result Value Ref Range   Glucose-Capillary 71 65 - 99 mg/dL   Comment 1 Notify RN   HIV antibody     Status: None   Collection Time: 02/08/15  4:35 PM  Result Value Ref Range   HIV Screen 4th Generation wRfx Non Reactive Non Reactive  Hepatitis C  antibody     Status: None   Collection Time: 02/08/15  4:35 PM  Result Value Ref Range   HCV Ab <0.1 0.0 - 0.9 s/co ratio  Hepatitis B surface antigen     Status: None   Collection Time: 02/08/15  4:35 PM  Result Value Ref Range   Hepatitis B Surface Ag Negative Negative  C3 complement     Status: None   Collection Time: 02/08/15  4:35 PM  Result Value Ref Range   C3 Complement 146 82 - 167 mg/dL  C4 complement     Status: Abnormal   Collection Time: 02/08/15  4:35 PM  Result Value Ref Range   Complement C4, Body Fluid 46 (H) 14 - 44 mg/dL  Parathyroid hormone, intact (no Ca)     Status: None   Collection Time: 02/08/15  4:35 PM  Result Value Ref Range   PTH 57 15 - 65  pg/mL  Glucose, capillary     Status: Abnormal   Collection Time: 02/08/15  4:53 PM  Result Value Ref Range   Glucose-Capillary 103 (H) 65 - 99 mg/dL  Protein / creatinine ratio, urine     Status: Abnormal   Collection Time: 02/08/15  6:22 PM  Result Value Ref Range   Creatinine, Urine 43.87 mg/dL   Total Protein, Urine 99 mg/dL   Protein Creatinine Ratio 2.26 (H) 0.00 - 0.15 mg/mg[Cre]  Glucose, capillary     Status: None   Collection Time: 02/08/15  9:32 PM  Result Value Ref Range   Glucose-Capillary 98 65 - 99 mg/dL  Glucose, capillary     Status: None   Collection Time: 02/09/15  5:46 AM  Result Value Ref Range   Glucose-Capillary 70 65 - 99 mg/dL   [place] Renal  [id number]/28/2016  CLINICAL DATA:  Patient with chronic renal disease.  Morbid obesity. EXAM: RENAL / URINARY TRACT ULTRASOUND COMPLETE COMPARISON:  Pelvic ultrasound 08/27/2012. FINDINGS: Right Kidney: Length: 11.4 cm. Echogenicity within normal limits. No mass or hydronephrosis visualized. Left Kidney: Length: 11.2 cm. Echogenicity within normal limits. No mass or hydronephrosis visualized. Bladder: Appears normal for degree of bladder distention. IMPRESSION: No hydronephrosis. Electronically Signed   By: Annia Beltrew  Davis M.D.   On: 02/08/2015 21:38     Assessment/Plan: Diagnosis: Deconditioning after acute on chronic congestive heart failure with acute renal failure 1. Does the need for close, 24 hr/day medical supervision in concert with the patient's rehab needs make it unreasonable for this patient to be served in a less intensive setting? Potentially 2. Co-Morbidities requiring supervision/potential complications: Morbid obesity,Diabetes, hypertension 3. Due to bladder management, bowel management, safety, skin/wound care, disease management, medication administration, pain management and patient education, does the patient require 24 hr/day rehab nursing? Yes 4. Does the patient require coordinated care of a physician, rehab nurse, PT (1-2 hrs/day, 5 days/week) and OT (1-2 hrs/day, 5 days/week) to address physical and functional deficits in the context of the above medical diagnosis(es)? Potentially Addressing deficits in the following areas: balance, endurance, locomotion, strength, transferring, bowel/bladder control, bathing, dressing and toileting 5. Can the patient actively participate in an intensive therapy program of at least 3 hrs of therapy per day at least 5 days per week? Yes 6. The potential for patient to make measurable gains while on inpatient rehab is excellent 7. Anticipated functional outcomes upon discharge from inpatient rehab are modified independent  with PT, modified independent with OT, n/a with SLP. 8. Estimated rehab length of stay to reach the above functional goals is: 7 days 9. Does the patient have adequate social supports and living environment to accommodate these discharge functional goals? Yes 10. Anticipated D/C setting: Home 11. Anticipated post D/C treatments: HH therapy 12. Overall Rehab/Functional Prognosis: excellent  RECOMMENDATIONS: This patient's condition is appropriate for continued rehabilitative care in the following setting: CIR Patient has agreed to participate in recommended program.  No Note that insurance prior authorization may be required for reimbursement for recommended care.  Comment: Patient and husband refusing CIR. Will need home health therapy as well as 24 7 minutes assist level from family    02/09/2015

## 2015-02-09 NOTE — Care Management Note (Signed)
Case Management Note  Patient Details  Name: [name] MRN: [id number] Date of Birth: 02/25/1975  Subjective/Objective:       Admitted with CHF             Action/Plan: Patient lives at home with her spouse and 3 children ( age 315, 673 and 38 yr old) Spouse is the primary caregiver and very protective of the patient. CM explained that rehab is for the patient to get her stronger to do the things that she need to do at home. The spouse explained numerous things that he is doing for her during the course of the day. CM explained to the spouse that this is about the patient and not the care that he is giving her at home. Patient expressed the need to go home to see her children.[facility]. No DME needed. She has a walker, wheelchair, cane. Lots of encouragement given.   Expected Discharge Date:    possibly 02/10/2015              Expected Discharge Plan:  Home w Home Health Services  Discharge planning Services  CM Consult   Choice offered to:  Patient    HH Arranged:  Disease Management, RN, PT, Nurse's Aide [facility]  Status of Service:  In process, will continue to follow  Reola MosherChandler, Alenna Russell L, RN,MHA,BSN [phone] 02/09/2015, 11:42 AM

## 2015-02-09 NOTE — Progress Notes (Signed)
PROGRESS NOTE  [name] DOB: 06/21/1975 DOA: 02/04/2015 PCP: Lonia BloodGARBA,LAWAL, MD  Admit HPI / Brief Narrative: 39 y.o. female with a history of morbid obesity, Asthma, HTN, DM2, and previous necrotizing fasciitis of the thigh who presented to the ED with complaints of worsening edema of both legs, along with SOB and DOE over 2-3 weeks. She reported taking her lasix and blood pressure medications.  In the ED her BUN/Cr was elevated at 24/2.98 - last documented creatinine ~1 year ago 0.76 to 1.4. She was administered a 40 mg dose of IV Lasix and given Albuterol Nebs and referred for admission.   HPI/Subjective: - no complaints, appreciates improvement in her LE swelling - refused labs this morning  Assessment/Plan:  Acute Diastolic CHF exacerbation - anasarca - TTE notes EF 55-60% w/ no WMA and grade 1 DD - admit wgt 180.9kg - no weight recorded today - net negative ~11.9 L thus far  - continue Lasix 80 BID  Malignant HTN - Overall BP control had improved w/ nitro gtt  - weaned off, now on Coreg, Bidil - BP 128/59 this morning  Small mobile density on R coronary cusp AoV? - on 2D echo, TEE negative for vegetation "Aortic valve: No evidence of vegetation. 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 - CIR recommended and approved however patient declines and wants to go home.  Code Status: FULL Family Communication: d/w husband bedside Disposition Plan: d/c home 1-2 days if felt stable by nephrology as well    Consultants: Nephrology   Procedures: TTE - 12/25 TEE - 12/28  Antibiotics: none  DVT prophylaxis: SQ heparin   Objective: Blood pressure 128/59, pulse 74, temperature 97.6 F (36.4 C), temperature source Oral, resp. rate 18, height 5\' 4"  (1.626 m), weight 169.736 kg (374 lb 3.2 oz), SpO2 100 %.  Intake/Output Summary (Last 24 hours) at 02/09/15 1425 Last data filed at 02/09/15 1419  Gross per 24 hour  Intake    960 ml  Output   3400 ml  Net  -2440 ml   Exam: General: No acute respiratory distress at rest  Lungs: Without wheezes or crackles B- bs very distant making exam difficult  Cardiovascular: Regular rate and rhythm - heart sound very distant - no appreciable M Abdomen: Nontender, morbid obesity, soft, bowel sounds positive, no rebound, no ascites, no appreciable mass Extremities: No significant cyanosis, or clubbing;  trace edema bilateral lower extremities, improved  Data Reviewed: Basic Metabolic Panel:  Recent Labs Lab 02/04/15 0032 02/06/15 0850 02/07/15 1125 02/08/15 0352  NA 144 143 140 141  K 5.0 4.1 4.1 4.0  CL 112* 111 107 107  CO2 23 25 25 26   GLUCOSE 117* 66 99 80  BUN 24* 24* 27* 29*  CREATININE 2.98* 3.17* 3.31* 3.52*  CALCIUM 8.9 8.8* 8.4* 8.3*    CBC:  Recent Labs Lab 02/04/15 0032 02/06/15 0850 02/08/15 0352  WBC 6.5 5.2 5.0  NEUTROABS 3.6  --   --  HGB 9.9* 8.4* 8.3*  HCT 32.6* 26.8* 26.8*  MCV 87.2 87.0 86.2  PLT 261 227 229    Liver Function Tests:  Recent Labs Lab 02/04/15 0032 02/06/15 0850  AST 12* 11*  ALT 11* 10*  ALKPHOS 46 39  BILITOT 0.4 0.5  PROT 6.2* 5.3*  ALBUMIN 2.3* 2.0*    Cardiac Enzymes:  Recent Labs Lab 02/04/15 0032  TROPONINI 0.05*   CBG:  Recent Labs Lab 02/08/15 1239 02/08/15 1653 02/08/15 2132 02/09/15 0546 02/09/15 1104  GLUCAP 71 103* 98 70 81   Recent Results (from the past 240 hour(s))  MRSA PCR Screening     Status: None   Collection Time: 02/05/15  3:47 AM   Result Value Ref Range Status   MRSA by PCR NEGATIVE NEGATIVE Final    Comment:        The GeneXpert MRSA Assay (FDA approved for NASAL specimens only), is one component of a comprehensive MRSA colonization surveillance program. It is not intended to diagnose MRSA infection nor to guide or monitor treatment for MRSA infections.     Studies:  Recent x-ray studies have been reviewed in detail by the Attending Physician  Scheduled Meds:  Scheduled Meds: . arformoterol  15 mcg Nebulization Q12H  . aspirin EC  325 mg Oral Daily  . atorvastatin  20 mg Oral q1800  . budesonide  0.25 mg Nebulization Q12H  . carvedilol  25 mg Oral BID WC  . feeding supplement (PRO-STAT SUGAR FREE 64)  30 mL Oral Daily  . ferrous sulfate  325 mg Oral TID WC  . furosemide  80 mg Intravenous BID  . gemfibrozil  600 mg Oral BID AC  . heparin  5,000 Units Subcutaneous 3 times per day  . insulin aspart  0-5 Units Subcutaneous QHS  . insulin aspart  0-9 Units Subcutaneous TID WC  . insulin glargine  15 Units Subcutaneous QHS  . isosorbide-hydrALAZINE  2 tablet Oral TID    Pamella Pert , MD   [facility] Pager [phone]  If 7PM-7AM, please contact night-coverage [email or web address] Password [email or web address] 02/09/2015, 2:25 PM   LOS: 4 days

## 2015-02-09 NOTE — Consult Note (Signed)
Admit: 02/04/2015 LOS: 4  89F with Acute Diastolic HF Exacerbation, Hypervolemia 2/2 dietary Na exces and impaired natriuresis, likely nephrotic syndrome and CKD4  Subjective:  Refused labs this AM Remains on IV lasix Pt refuses CIR  Renal US without obstruction, normal sized kidneys  12/28 0701 - 12/29 0700 In: 480 [P.O.:480] Out: 3000 [Urine:3000]  Filed Weights   02/07/15 1818 02/09/15 0343  Weight: 180 kg (396 lb 13.3 oz) 169.736 kg (374 lb 3.2 oz)    Scheduled Meds: . arformoterol  15 mcg Nebulization Q12H  . aspirin EC  325 mg Oral Daily  . atorvastatin  20 mg Oral q1800  . budesonide  0.25 mg Nebulization Q12H  . carvedilol  25 mg Oral BID WC  . feeding supplement (PRO-STAT SUGAR FREE 64)  30 mL Oral Daily  . ferrous sulfate  325 mg Oral TID WC  . furosemide  80 mg Intravenous BID  . gemfibrozil  600 mg Oral BID AC  . heparin  5,000 Units Subcutaneous 3 times per day  . insulin aspart  0-5 Units Subcutaneous QHS  . insulin aspart  0-9 Units Subcutaneous TID WC  . insulin glargine  15 Units Subcutaneous QHS  . isosorbide-hydrALAZINE  2 tablet Oral TID   Continuous Infusions:  PRN Meds:.acetaminophen, ipratropium-albuterol, ondansetron (ZOFRAN) IV, oxyCODONE, traMADol  Current Labs: reviewed    Physical Exam:  Blood pressure 134/66, pulse 65, temperature 97.6 F (36.4 C), temperature source Oral, resp. rate 18, height 5\' 4"  (1.626 m), weight 169.736 kg (374 lb 3.2 oz), SpO2 95 %. GEN: morbidly obese female in NAD ENT: NCAT EYES: disconjugate gaze CV: RRR PULM: diminished bs throughout ABD: obese, limited exam, soft SKIN: no rashes EXT:4+ Edema in legs  Labs 12/28 HIV Neg HCV Neg HBV S Ag neg dsDNA negative C3 and C4 WNL PTH 57 UP/C 2.26 ANCA pending SPEP and sFLC pending ANA pending  A 1. 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 Subnephrotic Proteinuria UP/C 2.26 3. Morbid  Obesity 4. Anasarca 5. Acute Diastolic HF Exacerbation 6. DM2 > 20y, unknown if has DR 7. HTN 8. Anemia, normocytic, low reticulocyte count, appears Fe deficient  Plan 1. Cont to diuresis, remains hypervolemic 2. Need Labs, discussed with patient 3. Only by meaningful changes in diet and weight will she have an optimal outcome 4. Daily weights, Daily Renal Panel, Strict I/Os, Avoid nephrotoxins (NSAIDs, judicious IV Contrast)  Sabra Heckyan Torris House MD 02/09/2015, 11:05 AM   Recent Labs Lab 02/06/15 0850 02/07/15 1125 02/08/15 0352  NA 143 140 141  K 4.1 4.1 4.0  CL 111 107 107  CO2 25 25 26   GLUCOSE 66 99 80  BUN 24* 27* 29*  CREATININE 3.17* 3.31* 3.52*  CALCIUM 8.8* 8.4* 8.3*    Recent Labs Lab 02/04/15 0032 02/06/15 0850 02/08/15 0352  WBC 6.5 5.2 5.0  NEUTROABS 3.6  --   --   HGB 9.9* 8.4* 8.3*  HCT 32.6* 26.8* 26.8*  MCV 87.2 87.0 86.2  PLT 261 227 229

## 2015-02-10 LAB — GLUCOSE, CAPILLARY: GLUCOSE-CAPILLARY: 99 mg/dL (ref 65–99)

## 2015-02-10 LAB — BASIC METABOLIC PANEL
ANION GAP: 10 (ref 5–15)
BUN: 30 mg/dL — ABNORMAL HIGH (ref 6–20)
CO2: 25 mmol/L (ref 22–32)
Calcium: 8.3 mg/dL — ABNORMAL LOW (ref 8.9–10.3)
Chloride: 105 mmol/L (ref 101–111)
Creatinine, Ser: 3.74 mg/dL — ABNORMAL HIGH (ref 0.44–1.00)
GFR calc non Af Amer: 14 mL/min — ABNORMAL LOW (ref >60)
GFR, EST AFRICAN AMERICAN: 17 mL/min — AB (ref >60)
Glucose, Bld: 67 mg/dL (ref 65–99)
POTASSIUM: 3.7 mmol/L (ref 3.5–5.1)
Sodium: 140 mmol/L (ref 135–145)

## 2015-02-10 MED ORDER — FUROSEMIDE 40 MG PO TABS: 120.0000 mg | ORAL_TABLET | Freq: Two times a day (BID) | ORAL | Status: DC

## 2015-02-10 MED ORDER — CARVEDILOL 25 MG PO TABS: 25.0000 mg | ORAL_TABLET | Freq: Two times a day (BID) | ORAL | Status: DC

## 2015-02-10 MED ORDER — FERROUS SULFATE 325 (65 FE) MG PO TABS: 325.0000 mg | ORAL_TABLET | Freq: Three times a day (TID) | ORAL | Status: DC

## 2015-02-10 NOTE — Progress Notes (Signed)
[name] made aware [facility] home today. Linda DerrickB [name] [facility], [facility], [facility],MHA,BSN [phone]

## 2015-02-10 NOTE — Progress Notes (Signed)
Inpatient Diabetes Program Recommendations  AACE/ADA: New Consensus Statement on Inpatient Glycemic Control (2015)  Target Ranges:  Prepandial:   less than 140 mg/dL      Peak postprandial:   less than 180 mg/dL (1-2 hours)      Critically ill patients:  140 - 180 mg/dL  Results for [name], Derra (MRN [id number]) as of 02/10/2015 09:15  Ref. Range 02/09/2015 05:46 02/09/2015 11:04 02/09/2015 16:14 02/09/2015 20:21 02/10/2015 05:29  Glucose-Capillary Latest Ref Range: 65-99 mg/dL 70 81 74 97 99   Review of Glycemic Control  Current orders for Inpatient glycemic control: Lantus 15 units QHS (do no give if CBG < 100 mg/dl), Novolog 0-9 units TID with meals, Novolog 0-5 units QHS  Inpatient Diabetes Program Recommendations: Insulin - Basal: Glucose ranged from 70-97 mg/dl on 95/28/4112/29/16 and last dose of Lantus 15 units was given on 02/08/15 at 21:36. Noted patient did NOT receive Lantus last night and fasting glucose is 99 mg/dl today. Please consider decreasing Lantus to 10 units QHS.  Thanks, Orlando PennerMarie Ahna Konkle, RN, MSN, CDE Diabetes Coordinator Inpatient Diabetes Program [phone] (Team Pager from 8am to 5pm) [phone] ([facility] office) [phone] [facility] office) [phone] [facility] office)

## 2015-02-10 NOTE — Consult Note (Signed)
Admit: 02/04/2015 LOS: 5  82F with Acute Diastolic HF Exacerbation, Hypervolemia 2/2 dietary Na exces and impaired natriuresis, subnephrotic proteinuria and CKD4  Subjective:  No new events  12/29 0701 - 12/30 0700 In: 920 [P.O.:920] Out: 2250 [Urine:2250]  Filed Weights   02/07/15 1818 02/09/15 0343 02/10/15 0451  Weight: 180 kg (396 lb 13.3 oz) 169.736 kg (374 lb 3.2 oz) 167.105 kg (368 lb 6.4 oz)    Scheduled Meds: . arformoterol  15 mcg Nebulization Q12H  . aspirin EC  325 mg Oral Daily  . atorvastatin  20 mg Oral q1800  . budesonide  0.25 mg Nebulization Q12H  . carvedilol  25 mg Oral BID WC  . feeding supplement (PRO-STAT SUGAR FREE 64)  30 mL Oral Daily  . ferrous sulfate  325 mg Oral TID WC  . furosemide  80 mg Intravenous BID  . gemfibrozil  600 mg Oral BID AC  . heparin  5,000 Units Subcutaneous 3 times per day  . insulin aspart  0-5 Units Subcutaneous QHS  . insulin aspart  0-9 Units Subcutaneous TID WC  . insulin glargine  15 Units Subcutaneous QHS  . isosorbide-hydrALAZINE  2 tablet Oral TID   Continuous Infusions:  PRN Meds:.acetaminophen, ipratropium-albuterol, ondansetron (ZOFRAN) IV, oxyCODONE, traMADol  Current Labs: reviewed    Physical Exam:  Blood pressure 121/75, pulse 76, temperature 98.4 F (36.9 C), temperature source Oral, resp. rate 18, height 5\' 4"  (1.626 m), weight 167.105 kg (368 lb 6.4 oz), SpO2 97 %. GEN: morbidly obese female in NAD ENT: NCAT EYES: disconjugate gaze CV: RRR PULM: diminished bs throughout ABD: obese, limited exam, soft SKIN: no rashes EXT:2+ Edema in legs  Labs 12/28 HIV Neg HCV Neg HBV S Ag neg dsDNA negative C3 and C4 WNL PTH 57 UP/C 2.26 ANCA negative SPEP and sFLC sFLC K:L 2.54 c/w CKD ANA negative  A 1. 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 Subnephrotic Proteinuria UP/C 2.26 3. Morbid Obesity 4. Anasarca,  improving 5. Acute Diastolic HF Exacerbation 6. DM2 > 20y, unknown if has DR 7. HTN 8. Anemia, normocytic, low reticulocyte count, appears Fe deficient  Plan 1. OK to switch to PO lasix -- 120 PO BID 2. Cont Fe, if Hb does not inc will need ESA consideration  3. Plan for DC tody and f/u with me in office 4. Cont to encourage dietary Na restriction 5. Only by meaningful changes in diet and weight will she have an optimal outcome 6. Daily weights, Daily Renal Panel, Strict I/Os, Avoid nephrotoxins (NSAIDs, judicious IV Contrast)  Sabra Heckyan Sanford MD 02/10/2015, 9:11 AM   Recent Labs Lab 02/07/15 1125 02/08/15 0352 02/10/15 0611  NA 140 141 140  K 4.1 4.0 3.7  CL 107 107 105  CO2 25 26 25   GLUCOSE 99 80 67  BUN 27* 29* 30*  CREATININE 3.31* 3.52* 3.74*  CALCIUM 8.4* 8.3* 8.3*    Recent Labs Lab 02/04/15 0032 02/06/15 0850 02/08/15 0352  WBC 6.5 5.2 5.0  NEUTROABS 3.6  --   --   HGB 9.9* 8.4* 8.3*  HCT 32.6* 26.8* 26.8*  MCV 87.2 87.0 86.2  PLT 261 227 229

## 2015-02-10 NOTE — Progress Notes (Signed)
Occupational Therapy Treatment Patient Details Name: [name] MRN: [id number] DOB: 12/17/1975 Today's Date: 02/10/2015    History of present illness 39 y.o. female admitted with Acute Diastolic CHF exacerbation - anasarca, malignant HTN, and Acute kidney injury vs true CKD. Hx of DM, asthma, and extreme morbid obesity.   OT comments  Patient making progress towards OT goals, continue plan of care for now. Assisted with stair negotiation with PT. Attempted to discussed ADL with patient and husband, but pt cut off therapist and stated he had things under control at home, assisting with ADLs. Educated patient on BUE strengthening HEP using level 2 theraband.   Follow Up Recommendations  CIR;Supervision/Assistance - 24 hour (family refusing, therefore recommend HHOT)    Equipment Recommendations  None recommended by OT    Recommendations for Other Services  None at this time   Precautions / Restrictions Precautions Precautions: Fall Restrictions Weight Bearing Restrictions: No    Mobility Bed Mobility  General bed mobility comments: Pt up in recliner upon OT/PT entering and exiting room  Transfers Overall transfer level: Needs assistance Equipment used: 1 person hand held assist Transfers: Sit to/from Stand Sit to Stand: Min assist;+2 safety/equipment General transfer comment: Min assist for boost to stand, hand held support for balance.    Balance Overall balance assessment: Needs assistance Sitting-balance support: No upper extremity supported;Feet supported Sitting balance-Leahy Scale: Fair     Standing balance support: No upper extremity supported;During functional activity Standing balance-Leahy Scale: Poor   ADL Overall ADL's : Needs assistance/impaired General ADL Comments: Family is very supportive. When therapist attempt to talk with patient and/or husband regarding ADLs, husband cut therapist off and stated that he bathes her and that they have  everything taken care of. Husband reports she has a BSC and shower seat. Therapist was able to educate patient on BUE strenthening HEP using level 2 theraband.     Cognition   Behavior During Therapy: WFL for tasks assessed/performed Overall Cognitive Status: Within Functional Limits for tasks assessed      Exercises General Exercises - Upper Extremity Shoulder ABduction: AROM;Both;10 reps;Theraband;Seated;Strengthening Theraband Level (Shoulder Abduction): Level 2 (Red) Shoulder ADduction: AROM;Both;10 reps;Seated;Theraband;Strengthening Theraband Level (Shoulder Adduction): Level 2 (Red) Shoulder Horizontal ABduction: AROM;Strengthening;10 reps;Both;Seated;Theraband Theraband Level (Shoulder Horizontal Abduction): Level 2 (Red) Shoulder Horizontal ADduction: AROM;Strengthening;Both;10 reps;Seated Elbow Extension: AROM;Strengthening;Both;10 reps;Seated;Theraband Theraband Level (Elbow Extension): Level 2 (Red)           Pertinent Vitals/ Pain       Pain Assessment: No/denies pain   Frequency Min 3X/week     Progress Toward Goals  OT Goals(current goals can now befound in the care plan section)  Progress towards OT goals: Progressing toward goals  Acute Rehab OT Goals Patient Stated Goal: Get around better  Plan Discharge plan remains appropriate    Co-evaluation    PT/OT/SLP Co-Evaluation/Treatment: Yes Reason for Co-Treatment: Complexity of the patient's impairments (multi-system involvement);For patient/therapist safety PT goals addressed during session: Mobility/safety with mobility;Balance OT goals addressed during session: ADL's and self-care;Strengthening/ROM         Activity Tolerance Patient tolerated treatment well   Patient Left in chair;with call bell/phone within reach;with family/visitor present     Time:1151-1208 0 PT/OT co-treat    1217-1225  OT Time Calculation (min): 8 min  Charges: OT General Charges $OT Visit: 1 Procedure OT  Treatments $Therapeutic Exercise: 8-22 mins  Edwin CapPatricia Breasia Karges , MS, OTR/L, CLT Pager: [phone]  02/10/2015, 1:31 PM

## 2015-02-10 NOTE — Discharge Instructions (Signed)
Follow with Barbette Merino, MD in 5-7 days  Please get a complete blood count and chemistry panel checked by your Primary MD at your next visit, and again as instructed by your Primary MD. Please get your medications reviewed and adjusted by your Primary MD.  Please request your Primary MD to go over all Hospital Tests and Procedure/Radiological results at the follow up, please get all Hospital records sent to your Prim MD by signing hospital release before you go home.  If you had Pneumonia of Lung problems at the Hospital: Please get a 2 view Chest X ray done in 6-8 weeks after hospital discharge or sooner if instructed by your Primary MD.  If you have Congestive Heart Failure: Please call your Cardiologist or Primary MD anytime you have any of the following symptoms:  1) 3 pound weight gain in 24 hours or 5 pounds in 1 week  2) shortness of breath, with or without a dry hacking cough  3) swelling in the hands, feet or stomach  4) if you have to sleep on extra pillows at night in order to breathe  Follow cardiac low salt diet and 1.5 lit/day fluid restriction.  If you have diabetes Accuchecks 4 times/day, Once in AM empty stomach and then before each meal. Log in all results and show them to your primary doctor at your next visit. If any glucose reading is under 80 or above 300 call your primary MD immediately.  If you have Seizure/Convulsions/Epilepsy: Please do not drive, operate heavy machinery, participate in activities at heights or participate in high speed sports until you have seen by Primary MD or a Neurologist and advised to do so again.  If you had Gastrointestinal Bleeding: Please ask your Primary MD to check a complete blood count within one week of discharge or at your next visit. Your endoscopic/colonoscopic biopsies that are pending at the time of discharge, will also need to followed by your Primary MD.  Get Medicines reviewed and adjusted. Please take all your  medications with you for your next visit with your Primary MD  Please request your Primary MD to go over all hospital tests and procedure/radiological results at the follow up, please ask your Primary MD to get all Hospital records sent to his/her office.  If you experience worsening of your admission symptoms, develop shortness of breath, life threatening emergency, suicidal or homicidal thoughts you must seek medical attention immediately by calling 911 or calling your MD immediately  if symptoms less severe.  You must read complete instructions/literature along with all the possible adverse reactions/side effects for all the Medicines you take and that have been prescribed to you. Take any new Medicines after you have completely understood and accpet all the possible adverse reactions/side effects.   Do not drive or operate heavy machinery when taking Pain medications.   Do not take more than prescribed Pain, Sleep and Anxiety Medications  Special Instructions: If you have smoked or chewed Tobacco  in the last 2 yrs please stop smoking, stop any regular Alcohol  and or any Recreational drug use.  Wear Seat belts while driving.  Please note You were cared for by a hospitalist during your hospital stay. If you have any questions about your discharge medications or the care you received while you were in the hospital after you are discharged, you can call the unit and asked to speak with the hospitalist on call if the hospitalist that took care of you is not available. Once you  are discharged, your primary care physician will handle any further medical issues. Please note that NO REFILLS for any discharge medications will be authorized once you are discharged, as it is imperative that you return to your primary care physician (or establish a relationship with a primary care physician if you do not have one) for your aftercare needs so that they can reassess your need for medications and monitor your  lab values.  You can reach the hospitalist office at phone [phone] or fax [phone]   If you do not have a primary care physician, you can call [phone] for a physician referral.  Activity: As tolerated with Full fall precautions use walker/cane & assistance as needed  Diet: low sodium, heart healthy  Disposition Home

## 2015-02-10 NOTE — Progress Notes (Signed)
Physical Therapy Treatment Patient Details Name: [name] MRN: [id number] DOB: 1975-07-18 Today's Date: 02/10/2015    History of Present Illness 39 y.o. female admitted with Acute Diastolic CHF exacerbation - anasarca, malignant HTN, and Acute kidney injury vs true CKD. Hx of DM, asthma, and extreme morbid obesity.    PT Comments    Progressing well. Safely navigates half-flight of stairs with the assist from husband and PT to advance RLE onto step due to body habitus restrictions. No overt loss of balance. Apparently at baseline with stair navigational abilities and pt/family is eager to return home. Declined CIR/SNF - suggest HHPT to further progress pt safety and independence with mobility.  Follow Up Recommendations  CIR (Refused CIR and SNF- suggest HHPT to progress)     Equipment Recommendations  None recommended by PT    Recommendations for Other Services       Precautions / Restrictions Precautions Precautions: Fall Restrictions Weight Bearing Restrictions: No    Mobility  Bed Mobility                  Transfers   Equipment used: 1 person hand held assist Transfers: Sit to/from Stand Sit to Stand: Min assist;+2 safety/equipment         General transfer comment: Min assist for boost to stand, hand held support for balance.  Ambulation/Gait Ambulation/Gait assistance: Min assist Ambulation Distance (Feet): 5 Feet Assistive device: 1 person hand held assist Gait Pattern/deviations: Step-through pattern;Wide base of support Gait velocity: Hand held support for balance to approach stairs. No buckling noted. Gait velocity interpretation: Below normal speed for age/gender General Gait Details: Min assist for walker control with tolerance to short distance in room. Easily fatigued. Cues for walker placement for proximity and correction with steering. No buckling noted although intermittently unsteady using RW.   Stairs Stairs: Yes Stairs  assistance: +2 physical assistance Stair Management: One rail Right;Step to pattern;Forwards Number of Stairs: 6 General stair comments: husband support on Lt for balance with use of rail on Rt to simulate home environment. PT assisting for RLE advancement onto each step due to limited ROM secondary to body habitus/obesity. Husband and sister able to safely talk patient through technique typically used to enter home. Pt demonstrates adequate strength to adavance up steps with assist from husband and sister. Apparently at baseline with this task. No overt loss of balance noted. Recommended cues for guarding and safety and better footware.  Wheelchair Mobility    Modified Rankin (Stroke Patients Only)       Balance                                    Cognition Arousal/Alertness: Awake/alert Behavior During Therapy: WFL for tasks assessed/performed Overall Cognitive Status: Within Functional Limits for tasks assessed                      Exercises      General Comments General comments (skin integrity, edema, etc.): No further concerns from Pt or family. Eager to return home      Pertinent Vitals/Pain Pain Assessment: No/denies pain    Home Living                      Prior Function            PT Goals (current goals can now be found in the care plan section) Acute  Rehab PT Goals Patient Stated Goal: Get around better PT Goal Formulation: With patient Time For Goal Achievement: 02/22/15 Potential to Achieve Goals: Good Progress towards PT goals: Progressing toward goals    Frequency  Min 3X/week    PT Plan Discharge plan needs to be updated    Co-evaluation PT/OT/SLP Co-Evaluation/Treatment: Yes Reason for Co-Treatment: Complexity of the patient's impairments (multi-system involvement);For patient/therapist safety PT goals addressed during session: Mobility/safety with mobility;Balance       End of Session Equipment Utilized  During Treatment: Gait belt Activity Tolerance: Patient tolerated treatment well Patient left: in chair;with call bell/phone within reach;with family/visitor present     Time: 1153-1208 (time shared with OT, who stayed beyond PT) PT Time Calculation (min) (ACUTE ONLY): 15 min  Charges:  $Gait Training: 8-22 mins                    G Codes:      [name], Donovyn Guidice S 02/10/2015, 1:07 PM Sunday SpillersLogan Secor [place], [place] [phone]

## 2015-02-10 NOTE — Discharge Summary (Addendum)
Physician Discharge Summary  Syvilla Martin ZOX:[id number] DOB: [birth year] DOA: 02/04/2015  PCP: Lonia Blood, MD  Admit date: 02/04/2015 Discharge date: 02/10/2015  Time spent: > 30 minutes  Recommendations for Outpatient Follow-up:  1. Follow up with Dr. Marisue Humble as scheduled 2. Follow up with Dr. Mikeal Hawthorne in 2-4 weeks  3. Please note that patient was recommended to go to inpatient rehabilitation, however declined and home health services were arranged on discharge  Discharge Diagnoses:  Principal Problem:   Acute CHF (congestive heart failure) (HCC) Active Problems:   AKI (acute kidney injury) (HCC)   Anasarca   Asthma   Hypoalbuminemia   Anemia due to other cause   Morbid obesity (HCC)   Aortic valve mass   Chronic kidney disease (CKD), stage IV (severe) (HCC)  Discharge Condition: stable  Diet recommendation: heart healthy, low salt, low calorie  Filed Weights   02/07/15 1818 02/09/15 0343 02/10/15 0451  Weight: 180 kg (396 lb 13.3 oz) 169.736 kg (374 lb 3.2 oz) 167.105 kg (368 lb 6.4 oz)    History of present illness:  See H&P, Labs, Consult and Test reports for all details in brief, patient is a 39 y.o. female with a history of morbid obesity, Asthma, HTN, DM2, and previous necrotizing fasciitis of the thigh who presented to the ED with complaints of worsening edema of both legs, along with SOB and DOE over 2-3 weeks. She reported taking her lasix and blood pressure medications. In the ED her BUN/Cr was elevated at 24/2.98 - last documented creatinine ~1 year ago 0.76 to 1.4. She was administered a 40 mg dose of IV Lasix and given Albuterol Nebs and referred for admission.   Hospital Course:  Acute Diastolic CHF exacerbation - patient was admitted to the hospital with anasarca as well as renal failure, she was started on IV Lasix with excellent urine output, she was net -13.6 L and her weight has improved from 398 pounds to 368 pounds on discharge.  Acute  kidney injury on CKD IV - Suspected HTN and DM related however patient with prior normal renal function vs obesity FSGS. Given significant worsening of her renal function and without any improvement, nephrology was consulted and have follow-up patient will hospitalized. It is felt that she truly has chronic kidney disease. She was placed on 120 mg of Lasix twice daily on discharge, and she will be established with outpatient follow-up with nephrology. Malignant HTN - Overall BP control had improved w/ nitro gtt as well as with diuresis, patient was started on Coreg, blood pressure seems improved on this regimen, she will need further outpatient follow-up and titration of her regimen. Small mobile density on R coronary cusp AoV - on 2D echo, TEE negative for vegetation "Aortic valve: No evidence of vegetation. There is a prominent node of Arantius on the left coronary cusp." DM2 - CBG currently well controlled - A1c 7.8  Asthma - Quiescent  Hypoalbuminemia  Anemia - Follow Hgb trend - no evidence of acute blood loss - felt to be due to CKD. She will be discharged on iron supplementation. Extreme morbid obeisty - Body mass index is 64.2 kg/(m^2) - CIR recommended and approved however patient declines and wants to go home. Home health services were arranged on discharge.  Procedures:  2-D echo  TEE   Consultations:  Nephrology  Discharge Exam: Filed Vitals:   02/09/15 2010 02/09/15 2012 02/10/15 0451 02/10/15 0808  BP:  159/70 121/75   Pulse:  83 76   Temp:  98.8 F (37.1 C) 98.4 F (36.9 C)   TempSrc:  Oral Oral   Resp:  18 18   Height:      Weight:   167.105 kg (368 lb 6.4 oz)   SpO2: 96% 98% 98% 97%    General: no acute distress Cardiovascular: regular rate and rhythm, 1+ LE edema Respiratory: clear to auscultation bilaterally, no wheezing  Discharge Instructions Activity:  As tolerated   Get Medicines reviewed and adjusted: Please take all your medications with you for  your next visit with your Primary MD  Please request your Primary MD to go over all hospital tests and procedure/radiological results at the follow up, please ask your Primary MD to get all Hospital records sent to his/her office.  If you experience worsening of your admission symptoms, develop shortness of breath, life threatening emergency, suicidal or homicidal thoughts you must seek medical attention immediately by calling 911 or calling your MD immediately if symptoms less severe.  You must read complete instructions/literature along with all the possible adverse reactions/side effects for all the Medicines you take and that have been prescribed to you. Take any new Medicines after you have completely understood and accpet all the possible adverse reactions/side effects.   Do not drive when taking Pain medications.   Do not take more than prescribed Pain, Sleep and Anxiety Medications  Special Instructions: If you have smoked or chewed Tobacco in the last 2 yrs please stop smoking, stop any regular Alcohol and or any Recreational drug use.  Wear Seat belts while driving.  Please note  You were cared for by a hospitalist during your hospital stay. Once you are discharged, your primary care physician will handle any further medical issues. Please note that NO REFILLS for any discharge medications will be authorized once you are discharged, as it is imperative that you return to your primary care physician (or establish a relationship with a primary care physician if you do not have one) for your aftercare needs so that they can reassess your need for medications and monitor your lab values.    Medication List    TAKE these medications        acetaminophen 325 MG tablet  Commonly known as:  TYLENOL  Take 650 mg by mouth every 6 (six) hours as needed for mild pain, fever or headache.     albuterol 108 (90 Base) MCG/ACT inhaler  Commonly known as:  PROVENTIL HFA;VENTOLIN HFA  Inhale  2 puffs into the lungs every 4 (four) hours as needed for wheezing or shortness of breath.     amLODipine 10 MG tablet  Commonly known as:  NORVASC  Take 1 tablet (10 mg total) by mouth daily.     arformoterol 15 MCG/2ML Nebu  Commonly known as:  BROVANA  Take 15 mcg by nebulization every 12 (twelve) hours.     aspirin 325 MG EC tablet  Take 1 tablet (325 mg total) by mouth daily.     atorvastatin 20 MG tablet  Commonly known as:  LIPITOR  Take 1 tablet (20 mg total) by mouth daily at 6 PM.     budesonide 0.25 MG/2ML nebulizer solution  Commonly known as:  PULMICORT  Take 0.25 mg by nebulization every 12 (twelve) hours.     carvedilol 25 MG tablet  Commonly known as:  COREG  Take 1 tablet (25 mg total) by mouth 2 (two) times daily with a meal.     ferrous sulfate 325 (65 FE) MG  tablet  Take 1 tablet (325 mg total) by mouth 3 (three) times daily with meals.     furosemide 40 MG tablet  Commonly known as:  LASIX  Take 3 tablets (120 mg total) by mouth 2 (two) times daily.     gemfibrozil 600 MG tablet  Commonly known as:  LOPID  Take 1 tablet (600 mg total) by mouth 2 (two) times daily before a meal.     insulin aspart 100 UNIT/ML injection  Commonly known as:  novoLOG  Inject 0-20 Units into the skin 3 (three) times daily with meals. CBG < 70: implement hypoglycemia protocol CBG 70 - 120: 0 units CBG 121 - 150: 3 units CBG 151 - 200: 4 units CBG 201 - 250: 7 units CBG 251 - 300: 11 units CBG 301 - 350: 15 units CBG 351 - 400: 20 units     insulin glargine 100 UNIT/ML injection  Commonly known as:  LANTUS  Inject 0.15 mLs (15 Units total) into the skin daily.       Follow-up Information    Follow up with [facility].   Specialty:  Home Health Services   Why:  They will do your home health care at your home   Contact information:   [address] [place] [address] [phone]       Follow up with Arita Miss, MD.    Specialty:  Nephrology   Why:  And Labs-  our office will contact you   Contact information:   [address] [place] [address] [phone]       Follow up with Lonia Blood, MD. Go on 02/15/2015.   Specialty:  Internal Medicine   Why:  :30PM   Contact information:   [address] [place] [address] [phone]       The results of significant diagnostics from this hospitalization (including imaging, microbiology, ancillary and laboratory) are listed below for reference.    Significant Diagnostic Studies: Dg Chest 2 View  02/05/2015  CLINICAL DATA:  39 year old female with shortness of breath and bilateral lower extremity swelling. EXAM: CHEST  2 VIEW COMPARISON:  Radiograph dated 10/26/2013 FINDINGS: Evaluation is limited due to soft tissue attenuation. Two views of the chest do not demonstrate focal consolidation. There is no pleural effusion or pneumothorax. - there is mild cardiomegaly. The osseous structures appear unremarkable. IMPRESSION: No focal consolidation. Electronically Signed   By: Elgie Collard M.D.   On: 02/05/2015 00:04   US Renal  02/08/2015  CLINICAL DATA:  Patient with chronic renal disease.  Morbid obesity. EXAM: RENAL / URINARY TRACT ULTRASOUND COMPLETE COMPARISON:  Pelvic ultrasound 08/27/2012. FINDINGS: Right Kidney: Length: 11.4 cm. Echogenicity within normal limits. No mass or hydronephrosis visualized. Left Kidney: Length: 11.2 cm. Echogenicity within normal limits. No mass or hydronephrosis visualized. Bladder: Appears normal for degree of bladder distention. IMPRESSION: No hydronephrosis. Electronically Signed   By: Annia Belt M.D.   On: 02/08/2015 21:38    Microbiology: Recent Results (from the past 240 hour(s))  MRSA PCR Screening     Status: None   Collection Time: 02/05/15  3:47 AM  Result Value Ref Range Status   MRSA by PCR NEGATIVE NEGATIVE Final    Comment:        The GeneXpert MRSA Assay (FDA approved for NASAL  specimens only), is one component of a comprehensive MRSA colonization surveillance program. It is not intended to diagnose MRSA infection nor to guide  or monitor treatment for MRSA infections.      Labs: Basic Metabolic Panel:  Recent Labs Lab 02/04/15 0032 02/06/15 0850 02/07/15 1125 02/08/15 0352 02/10/15 0611  NA 144 143 140 141 140  K 5.0 4.1 4.1 4.0 3.7  CL 112* 111 107 107 105  CO2 GLUCOSE 117* 66 99 80 67  BUN 24* 24* 27* 29* 30*  CREATININE 2.98* 3.17* 3.31* 3.52* 3.74*  CALCIUM 8.9 8.8* 8.4* 8.3* 8.3*   Liver Function Tests:  Recent Labs Lab 02/04/15 0032 02/06/15 0850  AST 12* 11*  ALT 11* 10*  ALKPHOS 46 39  BILITOT 0.4 0.5  PROT 6.2* 5.3*  ALBUMIN 2.3* 2.0*   CBC:  Recent Labs Lab 02/04/15 0032 02/06/15 0850 02/08/15 0352  WBC 6.5 5.2 5.0  NEUTROABS 3.6  --   --   HGB 9.9* 8.4* 8.3*  HCT 32.6* 26.8* 26.8*  MCV 87.2 87.0 86.2  PLT 261 227 229   Cardiac Enzymes:  Recent Labs Lab 02/04/15 0032  TROPONINI 0.05*   BNP: BNP (last 3 results)  Recent Labs  02/04/15 0031  BNP 349.2*   CBG:  Recent Labs Lab 02/09/15 0546 02/09/15 1104 02/09/15 1614 02/09/15 2021 02/10/15 0529  GLUCAP 70 81 74 97 99    Signed:  Brantley Naser  [facility] 02/10/2015, 2:08 PM

## 2015-02-15 LAB — PROTEIN ELECTROPHORESIS, SERUM
ALPHA-1-GLOBULIN: 0.3
Albumin ELP: 1.9
Alpha-2-Globulin: 1.2
Beta Globulin: 0.9
GAMMA GLOBULIN: 0.9
Total Protein ELP: 5.2

## 2015-02-19 ENCOUNTER — Encounter (HOSPITAL_COMMUNITY): Payer: Self-pay | Admitting: Emergency Medicine

## 2015-02-19 ENCOUNTER — Emergency Department (HOSPITAL_COMMUNITY): Payer: Medicaid Other

## 2015-02-19 ENCOUNTER — Inpatient Hospital Stay (HOSPITAL_COMMUNITY)
Admission: EM | Admit: 2015-02-19 | Discharge: 2015-02-27 | DRG: 682 | Disposition: A | Discharge status: Home Health | Payer: Medicaid Other | Attending: Internal Medicine | Admitting: Internal Medicine

## 2015-02-19 DIAGNOSIS — E876 Hypokalemia: Secondary | ICD-10-CM | POA: Diagnosis not present

## 2015-02-19 DIAGNOSIS — L03116 Cellulitis of left lower limb: Secondary | ICD-10-CM

## 2015-02-19 DIAGNOSIS — Z993 Dependence on wheelchair: Secondary | ICD-10-CM | POA: Diagnosis not present

## 2015-02-19 DIAGNOSIS — D649 Anemia, unspecified: Secondary | ICD-10-CM | POA: Diagnosis present

## 2015-02-19 DIAGNOSIS — N179 Acute kidney failure, unspecified: Principal | ICD-10-CM | POA: Diagnosis present

## 2015-02-19 DIAGNOSIS — E662 Morbid (severe) obesity with alveolar hypoventilation: Secondary | ICD-10-CM | POA: Diagnosis present

## 2015-02-19 DIAGNOSIS — N2581 Secondary hyperparathyroidism of renal origin: Secondary | ICD-10-CM | POA: Diagnosis present

## 2015-02-19 DIAGNOSIS — R509 Fever, unspecified: Secondary | ICD-10-CM

## 2015-02-19 DIAGNOSIS — Z794 Long term (current) use of insulin: Secondary | ICD-10-CM

## 2015-02-19 DIAGNOSIS — I130 Hypertensive heart and chronic kidney disease with heart failure and stage 1 through stage 4 chronic kidney disease, or unspecified chronic kidney disease: Secondary | ICD-10-CM | POA: Diagnosis present

## 2015-02-19 DIAGNOSIS — R5381 Other malaise: Secondary | ICD-10-CM | POA: Diagnosis present

## 2015-02-19 DIAGNOSIS — I10 Essential (primary) hypertension: Secondary | ICD-10-CM

## 2015-02-19 DIAGNOSIS — J45901 Unspecified asthma with (acute) exacerbation: Secondary | ICD-10-CM | POA: Diagnosis not present

## 2015-02-19 DIAGNOSIS — B9620 Unspecified Escherichia coli [E. coli] as the cause of diseases classified elsewhere: Secondary | ICD-10-CM | POA: Diagnosis not present

## 2015-02-19 DIAGNOSIS — E1165 Type 2 diabetes mellitus with hyperglycemia: Secondary | ICD-10-CM | POA: Diagnosis present

## 2015-02-19 DIAGNOSIS — T502X5A Adverse effect of carbonic-anhydrase inhibitors, benzothiadiazides and other diuretics, initial encounter: Secondary | ICD-10-CM | POA: Diagnosis not present

## 2015-02-19 DIAGNOSIS — Z6841 Body Mass Index (BMI) 40.0 and over, adult: Secondary | ICD-10-CM

## 2015-02-19 DIAGNOSIS — Z7951 Long term (current) use of inhaled steroids: Secondary | ICD-10-CM

## 2015-02-19 DIAGNOSIS — N390 Urinary tract infection, site not specified: Secondary | ICD-10-CM | POA: Diagnosis not present

## 2015-02-19 DIAGNOSIS — Z79899 Other long term (current) drug therapy: Secondary | ICD-10-CM

## 2015-02-19 DIAGNOSIS — L03115 Cellulitis of right lower limb: Secondary | ICD-10-CM | POA: Diagnosis not present

## 2015-02-19 DIAGNOSIS — E119 Type 2 diabetes mellitus without complications: Secondary | ICD-10-CM

## 2015-02-19 DIAGNOSIS — Z7982 Long term (current) use of aspirin: Secondary | ICD-10-CM | POA: Diagnosis not present

## 2015-02-19 DIAGNOSIS — E1122 Type 2 diabetes mellitus with diabetic chronic kidney disease: Secondary | ICD-10-CM | POA: Diagnosis present

## 2015-02-19 DIAGNOSIS — T380X5A Adverse effect of glucocorticoids and synthetic analogues, initial encounter: Secondary | ICD-10-CM | POA: Diagnosis present

## 2015-02-19 DIAGNOSIS — J9600 Acute respiratory failure, unspecified whether with hypoxia or hypercapnia: Secondary | ICD-10-CM

## 2015-02-19 DIAGNOSIS — R0603 Acute respiratory distress: Secondary | ICD-10-CM

## 2015-02-19 DIAGNOSIS — L0390 Cellulitis, unspecified: Secondary | ICD-10-CM

## 2015-02-19 DIAGNOSIS — I5033 Acute on chronic diastolic (congestive) heart failure: Secondary | ICD-10-CM | POA: Diagnosis present

## 2015-02-19 DIAGNOSIS — D72829 Elevated white blood cell count, unspecified: Secondary | ICD-10-CM

## 2015-02-19 DIAGNOSIS — Z8673 Personal history of transient ischemic attack (TIA), and cerebral infarction without residual deficits: Secondary | ICD-10-CM

## 2015-02-19 DIAGNOSIS — N184 Chronic kidney disease, stage 4 (severe): Secondary | ICD-10-CM | POA: Diagnosis present

## 2015-02-19 DIAGNOSIS — J9601 Acute respiratory failure with hypoxia: Secondary | ICD-10-CM | POA: Diagnosis not present

## 2015-02-19 DIAGNOSIS — I5031 Acute diastolic (congestive) heart failure: Secondary | ICD-10-CM | POA: Diagnosis not present

## 2015-02-19 HISTORY — DX: Heart failure, unspecified: I50.9

## 2015-02-19 LAB — CBC WITH DIFFERENTIAL/PLATELET
Basophils Absolute: 0 10*3/uL (ref 0.0–0.1)
Basophils Relative: 0 %
EOS ABS: 0 10*3/uL (ref 0.0–0.7)
EOS PCT: 0 %
HEMATOCRIT: 32.3 % — AB (ref 36.0–46.0)
Hemoglobin: 10.4 g/dL — ABNORMAL LOW (ref 12.0–15.0)
LYMPHS ABS: 2.4 10*3/uL (ref 0.7–4.0)
Lymphocytes Relative: 12 %
MCH: 26.5 pg (ref 26.0–34.0)
MCHC: 32.2 g/dL (ref 30.0–36.0)
MCV: 82.2 fL (ref 78.0–100.0)
MONO ABS: 2.2 10*3/uL — AB (ref 0.1–1.0)
Monocytes Relative: 11 %
NEUTROS PCT: 77 %
Neutro Abs: 15.2 10*3/uL — ABNORMAL HIGH (ref 1.7–7.7)
PLATELETS: 172 10*3/uL (ref 150–400)
RBC: 3.93 MIL/uL (ref 3.87–5.11)
RDW: 14.4 % (ref 11.5–15.5)
WBC: 19.8 10*3/uL — AB (ref 4.0–10.5)

## 2015-02-19 LAB — I-STAT ARTERIAL BLOOD GAS, ED
ACID-BASE EXCESS: 1 mmol/L (ref 0.0–2.0)
BICARBONATE: 25.4 meq/L — AB (ref 20.0–24.0)
O2 SAT: 95 %
PCO2 ART: 37.3 mmHg (ref 35.0–45.0)
TCO2: 26 mmol/L (ref 0–100)
pH, Arterial: 7.44 (ref 7.350–7.450)
pO2, Arterial: 73 mmHg — ABNORMAL LOW (ref 80.0–100.0)

## 2015-02-19 LAB — BASIC METABOLIC PANEL
Anion gap: 14 (ref 5–15)
BUN: 58 mg/dL — AB (ref 6–20)
CHLORIDE: 98 mmol/L — AB (ref 101–111)
CO2: 25 mmol/L (ref 22–32)
CREATININE: 5.84 mg/dL — AB (ref 0.44–1.00)
Calcium: 8.4 mg/dL — ABNORMAL LOW (ref 8.9–10.3)
GFR calc non Af Amer: 8 mL/min — ABNORMAL LOW (ref >60)
GFR, EST AFRICAN AMERICAN: 10 mL/min — AB (ref >60)
Glucose, Bld: 189 mg/dL — ABNORMAL HIGH (ref 65–99)
POTASSIUM: 3.5 mmol/L (ref 3.5–5.1)
Sodium: 137 mmol/L (ref 135–145)

## 2015-02-19 LAB — GLUCOSE, CAPILLARY
GLUCOSE-CAPILLARY: 172 mg/dL — AB (ref 65–99)
Glucose-Capillary: 152 mg/dL — ABNORMAL HIGH (ref 65–99)

## 2015-02-19 LAB — I-STAT CG4 LACTIC ACID, ED: Lactic Acid, Venous: 1.76 mmol/L (ref 0.5–2.0)

## 2015-02-19 LAB — BRAIN NATRIURETIC PEPTIDE: B NATRIURETIC PEPTIDE 5: 921.9 pg/mL — AB (ref 0.0–100.0)

## 2015-02-19 MED ORDER — INSULIN ASPART 100 UNIT/ML ~~LOC~~ SOLN
0.0000 [IU] | Freq: Every day | SUBCUTANEOUS | Status: DC
  Administered 2015-02-20: 3 [IU] via SUBCUTANEOUS

## 2015-02-19 MED ORDER — ACETAMINOPHEN 500 MG PO TABS: 1000.0000 mg | ORAL_TABLET | Freq: Once | ORAL | Status: DC

## 2015-02-19 MED ORDER — SODIUM CHLORIDE 0.9 % IJ SOLN: 3.0000 mL | INTRAMUSCULAR | Status: DC | PRN

## 2015-02-19 MED ORDER — HEPARIN SODIUM (PORCINE) 5000 UNIT/ML IJ SOLN
5000.0000 [IU] | Freq: Three times a day (TID) | INTRAMUSCULAR | Status: DC
  Administered 2015-02-19 – 2015-02-27 (×22): 5000 [IU] via SUBCUTANEOUS
  Filled 2015-02-19 (×18): qty 1

## 2015-02-19 MED ORDER — ONDANSETRON HCL 4 MG PO TABS: 4.0000 mg | ORAL_TABLET | Freq: Four times a day (QID) | ORAL | Status: DC | PRN

## 2015-02-19 MED ORDER — METHYLPREDNISOLONE SODIUM SUCC 125 MG IJ SOLR
60.0000 mg | Freq: Four times a day (QID) | INTRAMUSCULAR | Status: DC
  Administered 2015-02-19 – 2015-02-22 (×13): 60 mg via INTRAVENOUS
  Filled 2015-02-19 (×13): qty 2

## 2015-02-19 MED ORDER — SODIUM CHLORIDE 0.9 % IJ SOLN
3.0000 mL | Freq: Two times a day (BID) | INTRAMUSCULAR | Status: DC
  Administered 2015-02-19 – 2015-02-22 (×5): 3 mL via INTRAVENOUS

## 2015-02-19 MED ORDER — FUROSEMIDE 10 MG/ML IJ SOLN
40.0000 mg | Freq: Once | INTRAMUSCULAR | Status: AC
  Administered 2015-02-19: 40 mg via INTRAVENOUS
  Filled 2015-02-19: qty 4

## 2015-02-19 MED ORDER — IPRATROPIUM BROMIDE 0.02 % IN SOLN
0.5000 mg | Freq: Once | RESPIRATORY_TRACT | Status: AC
  Administered 2015-02-19: 0.5 mg via RESPIRATORY_TRACT
  Filled 2015-02-19: qty 2.5

## 2015-02-19 MED ORDER — SODIUM CHLORIDE 0.9 % IJ SOLN
3.0000 mL | Freq: Two times a day (BID) | INTRAMUSCULAR | Status: DC
  Administered 2015-02-20 – 2015-02-27 (×11): 3 mL via INTRAVENOUS

## 2015-02-19 MED ORDER — SODIUM CHLORIDE 0.9 % IV SOLN
2000.0000 mg | INTRAVENOUS | Status: DC
  Administered 2015-02-21 – 2015-02-23 (×2): 2000 mg via INTRAVENOUS
  Filled 2015-02-19 (×2): qty 2000

## 2015-02-19 MED ORDER — SODIUM CHLORIDE 0.9 % IV SOLN: 250.0000 mL | INTRAVENOUS | Status: DC | PRN

## 2015-02-19 MED ORDER — DEXTROSE 5 % IV SOLN: 120.0000 mg | Freq: Two times a day (BID) | INTRAVENOUS | Status: DC

## 2015-02-19 MED ORDER — VANCOMYCIN HCL 10 G IV SOLR
2000.0000 mg | Freq: Once | INTRAVENOUS | Status: AC
  Administered 2015-02-19: 2000 mg via INTRAVENOUS
  Filled 2015-02-19: qty 2000

## 2015-02-19 MED ORDER — IPRATROPIUM-ALBUTEROL 0.5-2.5 (3) MG/3ML IN SOLN
3.0000 mL | RESPIRATORY_TRACT | Status: DC
  Administered 2015-02-19 – 2015-02-20 (×7): 3 mL via RESPIRATORY_TRACT
  Filled 2015-02-19 (×7): qty 3

## 2015-02-19 MED ORDER — CARVEDILOL 25 MG PO TABS
25.0000 mg | ORAL_TABLET | Freq: Two times a day (BID) | ORAL | Status: DC
  Administered 2015-02-19 – 2015-02-27 (×16): 25 mg via ORAL
  Filled 2015-02-19 (×16): qty 1

## 2015-02-19 MED ORDER — INSULIN ASPART 100 UNIT/ML ~~LOC~~ SOLN
0.0000 [IU] | Freq: Three times a day (TID) | SUBCUTANEOUS | Status: DC
  Administered 2015-02-20: 5 [IU] via SUBCUTANEOUS
  Administered 2015-02-20: 8 [IU] via SUBCUTANEOUS
  Administered 2015-02-20: 5 [IU] via SUBCUTANEOUS
  Administered 2015-02-21: 15 [IU] via SUBCUTANEOUS
  Administered 2015-02-21: 11 [IU] via SUBCUTANEOUS

## 2015-02-19 MED ORDER — ALBUTEROL SULFATE (2.5 MG/3ML) 0.083% IN NEBU
5.0000 mg | INHALATION_SOLUTION | Freq: Once | RESPIRATORY_TRACT | Status: AC
  Administered 2015-02-19: 5 mg via RESPIRATORY_TRACT
  Filled 2015-02-19: qty 6

## 2015-02-19 MED ORDER — HYDRALAZINE HCL 20 MG/ML IJ SOLN
10.0000 mg | Freq: Four times a day (QID) | INTRAMUSCULAR | Status: DC | PRN
  Administered 2015-02-20 – 2015-02-21 (×5): 10 mg via INTRAVENOUS
  Filled 2015-02-19 (×5): qty 1

## 2015-02-19 MED ORDER — ONDANSETRON HCL 4 MG/2ML IJ SOLN
4.0000 mg | Freq: Four times a day (QID) | INTRAMUSCULAR | Status: DC | PRN
  Administered 2015-02-25: 4 mg via INTRAVENOUS
  Filled 2015-02-19: qty 2

## 2015-02-19 MED ORDER — SODIUM CHLORIDE 0.9 % IV SOLN
Freq: Once | INTRAVENOUS | Status: AC
  Administered 2015-02-19: 14:00:00 via INTRAVENOUS

## 2015-02-19 MED ORDER — INSULIN GLARGINE 100 UNIT/ML ~~LOC~~ SOLN
15.0000 [IU] | Freq: Every day | SUBCUTANEOUS | Status: DC
  Administered 2015-02-19 – 2015-02-20 (×2): 15 [IU] via SUBCUTANEOUS
  Filled 2015-02-19 (×3): qty 0.15

## 2015-02-19 NOTE — Progress Notes (Signed)
ANTIBIOTIC CONSULT NOTE - INITIAL  Pharmacy Consult for Vancomycin Indication: Cellulitis  Allergies  Allergen Reactions  . Azithromycin Other (See Comments)    Nose bleeding event   Patient Measurements: Height: 5\' 5"  (165.1 cm) Weight: (!) 365 lb (165.563 kg) IBW/kg (Calculated) : 57  IBW + 30% = 74.1kg ABW for dosing - 100.4 kg  Vital Signs: Temp: 101.5 F (38.6 C) (01/08 1051) Temp Source: Rectal (01/08 1051) BP: 194/111 mmHg (01/08 1145) Pulse Rate: 86 (01/08 1145)  Labs:  Recent Labs  02/19/15 1123  WBC 19.8*  HGB 10.4*  PLT 172   Estimated Creatinine Clearance: 32.3 mL/min (by C-G formula based on Cr of 3.74).  Microbiology: Recent Results (from the past 720 hour(s))  MRSA PCR Screening     Status: None   Collection Time: 02/05/15  3:47 AM  Result Value Ref Range Status   MRSA by PCR NEGATIVE NEGATIVE Final    Comment:        The GeneXpert MRSA Assay (FDA approved for NASAL specimens only), is one component of a comprehensive MRSA colonization surveillance program. It is not intended to diagnose MRSA infection nor to guide or monitor treatment for MRSA infections.     Medical History: Past Medical History  Diagnosis Date  . Morbid obesity (HCC)   . Hypertension   . CVA (cerebral infarction) 1990's    "writing is not the same since"  . Asthma   . Type II diabetes mellitus (HCC)   . GERD (gastroesophageal reflux disease)   . Stroke (HCC)   . CHF (congestive heart failure) (HCC)    Medications:  Anti-infectives    Start     Dose/Rate Route Frequency Ordered Stop   02/19/15 1215  vancomycin (VANCOCIN) 2,000 mg in sodium chloride 0.9 % 500 mL IVPB     2,000 mg 250 mL/hr over 120 Minutes Intravenous  Once 02/19/15 1204       Assessment: 40yo recently admitted for worsening edema of both legs.  She comes in today complaining of overall weakness and family noted her to have fever.  In the ED a temp. was obtained and was 101 temporal.  We have  been asked to initiate IV Vancomycin for cellulitis and possible infection of the right posterior calf.  She has ongoing wound drainage and recent necrotizing fasciitis of the thigh.  She is morbidly obese and we will use an ABW for dosing of 100kg.  She has renal insufficiency with an estimated crcl of 6020ml/min.    Goal of Therapy:  Vancomycin trough level 10-15 mcg/ml  Plan:  - Will give Vancomycin 2gm IV x 1 now and then continue 2gm every 48 hours to allow adequate clearance. - Check ss trough level - Monitor renal function and adjust dose accordingly   Nadara MustardNita Nigil Braman, PharmD., MS Clinical Pharmacist Pager:  [phone] Thank you for allowing pharmacy to be part of this patients care team. 02/19/2015,12:04 PM

## 2015-02-19 NOTE — H&P (Addendum)
[facility] History and Physical  Linda SeatDavenia [name] ZOX:[id number] DOB: 07/04/1975 DOA: 02/19/2015  Referring physician: Sampson SiPa Abigail - [facility] PCP: Lonia BloodGARBA,LAWAL, MD   Chief Complaint: General malaise and fever.   HPI: Linda SeatDavenia Liou is a 40 y.o. female  Of note patient is an extremely poor historian. Patient is attended to by her husband who also is a very poor historian. Patient unable to give any real direct answers with regards to timing of illness or specific symptoms. History is obtained in part by family but also by ED P. [facility] after admission for  PT discharged from hospital on 02/10/15. She had been admitted for CHF exacerbation and acute kidney injury versus CKD.   Patient is a partially 7 days ago she developed loss of appetite, general weakness and cough and wheezing. Patient unsure which medication she takes because they're given by her sister. When asked why she does not take her medications the patient got a look on her face and said that's a good idea. Patient states that her sister gives her her medications. When discussing with the patient's sister over the phone she states that the patient is unable to see. When discussing this with patient the patient states she is able to see fine and is able to drive. Patient's husband also states that she developed a left lower leg skin tear several days ago. Nothing has been used to try and make this better or worse. Patient states that she has absolutely no pain in the area and doesn't notice the wound.  Patient states that she has not eaten or had anything to drink in 4 days.  3 days ago developed fever to 101.  Review of Systems:  Unable to obtain further review of systems due to patient's poor history giving and inability to provide answers when asked about specific review of systems questions.    Past Medical History  Diagnosis Date  . Morbid obesity (HCC)   .  Hypertension   . CVA (cerebral infarction) 1990's    "writing is not the same since" R leg weakness   . Asthma   . Type II diabetes mellitus (HCC)   . GERD (gastroesophageal reflux disease)   . Stroke (HCC)   . CHF (congestive heart failure) (HCC)     diastolic   Past Surgical History  Procedure Laterality Date  . Incision and drainage of wound Left 08/30/2013    thigh necrotic wound/notes 08/30/2013  . Irrigation and debridement abscess Left 08/30/2013    Procedure: IRRIGATION AND DEBRIDEMENT ABSCESS Left Thigh Necrotic Wound;  Surgeon: Axel FillerArmando Ramirez, MD;  Location: [facility] OR;  Service: General;  Laterality: Left;  . Tee without cardioversion N/A 09/07/2013    Procedure: TRANSESOPHAGEAL ECHOCARDIOGRAM (TEE);  Surgeon: Chrystie NoseKenneth C. Hilty, MD;  [facility];  Service: Cardiovascular;  Laterality: N/A;  . Tee without cardioversion N/A 02/08/2015    Procedure: TRANSESOPHAGEAL ECHOCARDIOGRAM (TEE);  Surgeon: Chilton Siiffany Bancroft, MD;  [facility];  Service: Cardiovascular;  Laterality: N/A;   Social History:  reports that she has never smoked. She has never used smokeless tobacco. She reports that she does not drink alcohol or use illicit drugs.  Allergies  Allergen Reactions  . Azithromycin Other (See Comments)    Nose bleeding event    Family History  Problem Relation Age of Onset  . Diabetes Mother   . Kidney disease Mother      Prior to Admission medications   Medication Sig Start  Date End Date Taking? Authorizing Provider  acetaminophen (TYLENOL) 325 MG tablet Take 650 mg by mouth every 6 (six) hours as needed for mild pain, fever or headache. 09/09/13   Maryann Mikhail, DO  albuterol (PROVENTIL HFA;VENTOLIN HFA) 108 (90 BASE) MCG/ACT inhaler Inhale 2 puffs into the lungs every 4 (four) hours as needed for wheezing or shortness of breath. 02/21/13   Arthor Captain, PA-C  amLODipine (NORVASC) 10 MG tablet Take 1 tablet (10 mg total) by mouth daily. 09/20/13   Richarda Overlie, MD  arformoterol (BROVANA) 15 MCG/2ML NEBU Take 15 mcg by nebulization every 12 (twelve) hours. 09/09/13   Maryann Mikhail, DO  aspirin EC 325 MG EC tablet Take 1 tablet (325 mg total) by mouth daily. 09/09/13   Maryann Mikhail, DO  atorvastatin (LIPITOR) 20 MG tablet Take 1 tablet (20 mg total) by mouth daily at 6 PM. 09/09/13   Maryann Mikhail, DO  budesonide (PULMICORT) 0.25 MG/2ML nebulizer solution Take 0.25 mg by nebulization every 12 (twelve) hours. 09/09/13   Maryann Mikhail, DO  carvedilol (COREG) 25 MG tablet Take 1 tablet (25 mg total) by mouth 2 (two) times daily with a meal. 02/10/15   Costin Otelia Sergeant, MD  ferrous sulfate 325 (65 FE) MG tablet Take 1 tablet (325 mg total) by mouth 3 (three) times daily with meals. 02/10/15   Costin Otelia Sergeant, MD  furosemide (LASIX) 40 MG tablet Take 3 tablets (120 mg total) by mouth 2 (two) times daily. 02/10/15   Costin Otelia Sergeant, MD  gemfibrozil (LOPID) 600 MG tablet Take 1 tablet (600 mg total) by mouth 2 (two) times daily before a meal. 09/09/13   Maryann Mikhail, DO  insulin aspart (NOVOLOG) 100 UNIT/ML injection Inject 0-20 Units into the skin 3 (three) times daily with meals. CBG < 70: implement hypoglycemia protocol CBG 70 - 120: 0 units CBG 121 - 150: 3 units CBG 151 - 200: 4 units CBG 201 - 250: 7 units CBG 251 - 300: 11 units CBG 301 - 350: 15 units CBG 351 - 400: 20 units 09/09/13   Maryann Mikhail, DO  insulin glargine (LANTUS) 100 UNIT/ML injection Inject 0.15 mLs (15 Units total) into the skin daily. Patient taking differently: Inject 15 Units into the skin at bedtime.  09/20/13   Richarda Overlie, MD   Physical Exam: Filed Vitals:   02/19/15 1130 02/19/15 1145 02/19/15 1200 02/19/15 1338  BP: 198/110 194/111 206/111 163/96  Pulse: 87 86 85   Temp:      TempSrc:      Resp:    12  Height:      Weight:      SpO2: 98% 94% 97%     Wt Readings from Last 3 Encounters:  02/19/15 165.563 kg (365 lb)  02/10/15 167.105 kg (368 lb 6.4  oz)  10/08/13 123.378 kg (272 lb)    General:  Appears calm and comfortable Eyes:  EOMI, normal lids, iris ENT: Very dry mm Neck:  no LAD, FROM Cardiovascular:  RRR, no m/r/g. 2-3+ LE pitting edema Respiratory: Diffuse wheezing throughout, decreased air movement, decreased breath sounds in the bases bilaterally, few crackles. Abdomen:  soft, ntnd Skin: Right leg tear approximately 6 x 3 cm with significant surrounding induration and erythema. Area is remarkably nontender to palpation. No crepitus felt. No purulence appreciated. Musculoskeletal:  grossly normal tone BUE/BLE Psychiatric: Flat affect, speech fluent and appropriate, somewhat tangential in conversation Neurologic:  CN 2-12 grossly intact, moves all extremities in coordinated fashion.  Labs on Admission:  Basic Metabolic Panel:  Recent Labs Lab 02/19/15 1123  NA 137  K 3.5  CL 98*  CO2 25  GLUCOSE 189*  BUN 58*  CREATININE 5.84*  CALCIUM 8.4*   Liver Function Tests: No results for input(s): AST, ALT, ALKPHOS, BILITOT, PROT, ALBUMIN in the last 168 hours. No results for input(s): LIPASE, AMYLASE in the last 168 hours. No results for input(s): AMMONIA in the last 168 hours. CBC:  Recent Labs Lab 02/19/15 1123  WBC 19.8*  NEUTROABS 15.2*  HGB 10.4*  HCT 32.3*  MCV 82.2  PLT 172   Cardiac Enzymes: No results for input(s): CKTOTAL, CKMB, CKMBINDEX, TROPONINI in the last 168 hours.  BNP (last 3 results)  Recent Labs  02/04/15 0031 02/19/15 1123  BNP 349.2* 921.9*    ProBNP (last 3 results) No results for input(s): PROBNP in the last 8760 hours.   CREATININE: 5.84 mg/dL ABNORMAL (16/10/96 0454) Estimated creatinine clearance - 20.7 mL/min  CBG: No results for input(s): GLUCAP in the last 168 hours.  Radiological Exams on Admission: Dg Chest Port 1 View  02/19/2015  CLINICAL DATA:  Weakness. EXAM: PORTABLE CHEST 1 VIEW COMPARISON:  02/04/2015 FINDINGS: There is moderate cardiac  enlargement. Asymmetric elevation of right hemidiaphragm noted. No pleural effusion or edema identified. IMPRESSION: 1. Cardiac enlargement. Electronically Signed   By: Signa Kell M.D.   On: 02/19/2015 11:32      Assessment/Plan Principal Problem:   Acute respiratory failure with hypoxia (HCC) Active Problems:   Diabetes mellitus with hyperglycemia (HCC)   AKI (acute kidney injury) (HCC)   Chronic kidney disease (CKD), stage IV (severe) (HCC)   Cellulitis   History of stroke   Asthma exacerbation   Acute diastolic (congestive) heart failure (HCC)   Acute respiratory failure: Likely multifactorial including obesity hypoventilation syndrome, CHF and asthma exacerbation. BNP 921.9 (possibly falsely elevated to due AKI/CKD), WBC 19.8, 3+ BLE, on 4 L nasal cannula, lactic acid 1.7, CXR without evidence of pulmonary congestion but showing cardiomegaly. Pt feels somewhat better after Nebulizer treatements, lasix and O2. Pt increased lasix to 120 PO BID after last admission. Wt stable at 360lbs. Echo 55% EF and Grade I diastolic dysfunction. - Stepdown - ABG - Duoneb Q4 - SOlumedrol 60 Q6 - Strict I/O, dly wts - O2 PRN - CXR in am   ------------------- ADDENDUM -----------------------------  Pt seen by Dr. Eliott Nine. Greatly appreciate their input Per Nephrology recommendation will hold diuresis for 24 hrs to see if renal function improves before restarting -  Lasix 120 IV TID (COnsider DC in am if pt has improved) - ORIGINALLY ORDERED BUT HELD AFTER DISCUSSION W/ DR Eliott Nine    Acute on Chronic Kidney disease: Patient crying began to be markedly elevated at last admission. Mr. discontinued with creatinine currently at 5.4. Technically this still may be in only the AK I range as elevation is only been noted for 1 month. Patient was seen during last admission by nephrology Dr. 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 This is likely been  worsened due to increased diuresis recently and cardiorenal syndrome. Discussed case with nephrology, Dr. Eliott Nine. Greatly appreciate their assistance on this case. Suspect worsening before improvement given diuresis - Follow-up nephrology recommendations - BMET in morning  RLE cellulitis: impressive on exam but completely non-painful to pt. No evidence of crepitus. WBC 19.8, Febrile. Vanc started in ED. - continue Vanc - +/- Zosyn for pseudomonal coverage if not improving.  -  wound care - consider Unaboot when DC   DM: A1c 7.8.  - continue home Lantus - SSI  HTN: - continue Coreg  History of Stroke: baseline RLE weakness. Pt wheelchair bound most of the time. - ASA - PT/OT  Psychosocial: Pt is a very very poor historian. She has little to no insight into her chronic medical conditions. Her medications are dosed by her sister (sister states that this is due to pts poor eyesight but the pt states she drives). No formal underlying psych or mental diagnosis.  - CSW consult - +/- Psych consult  Code Status: FULL  DVT Prophylaxis: Hep Family Communication: husband and daughter Disposition Plan: Pending Improvement    MERRELL, DAVID J, MD Family Medicine [facility] [email or web address] Password [email or web address]

## 2015-02-19 NOTE — Consult Note (Signed)
[facility] Consultation Note Requesting Physician:  Dr. Konrad Dolores Reason for Consult:  Worsening renal failure  HPI: The patient is a 40 y.o. year-old AAF with a background of morbid obesity, HTN, DM2, dCHF CKD 4 (01/2015) with sub-nephrotic proteinuria (2.26 gm 02/08/15). Seen by Dr. Marisue Humble during a December admission for acute dCHF exacerbation. She had extensive lab evaluation for her CKD that included the following: HIV Neg HCV Neg HBV S Ag neg dsDNA negative C3 and C4 WNL PTH 57 UP/C 2.26 ANCA negative SPEP no mispike ANA negative    Creatinine worsened during that admission (2.98 adm->3.74  and was trending up at discharge ) as she underwent a 13.6 liter diuresis and her weight dropped 30 lb.to 368 lb. She was sent out on a higher dose of lasix (120 BID) with plans to followup with Dr. Marisue Humble in the office for her CKD4.   She returns to the ED today with loss of appetite, weakness (too weak to walk), lying in the bed for 4 days with cough. wheezing, report of fever to 101 (husband says febrile X 3 days), large skin tear R leg with induration and erythema (neither she nor her husband know how long it's been there) , WBC 19,800. CXR negative for infiltrate (also no edema or pleural effusion). Can't tell from talking with her what she has been doing with her medications at home. Her creatinine has increased from 3.74->5.84 in the last 8 days and we are asked to see.  Creatinine trending for reference is as follows: 02/19/2015 11:23 AM 5.84* 0.44 - 1.00 mg/dL Final  16/11/9602 54:09 AM 3.74* 0.44 - 1.00 mg/dL Final  81/19/1478 29:56 AM 3.52* 0.44 - 1.00 mg/dL Final  21/30/8657 84:69 AM 3.31* 0.44 - 1.00 mg/dL Final  62/95/2841 32:44 AM 3.17* 0.44 - 1.00 mg/dL Final  02/13/7251 66:44 AM 2.98* 0.44 - 1.00 mg/dL Final  03/47/4259 56:38 PM 0.76 0.50 - 1.10 mg/dL Final  75/64/3329 51:88 AM 1.12* 0.50 - 1.10 mg/dL Final  41/66/0630 16:01 AM 1.21* 0.50 - 1.10 mg/dL Final  09/32/3557 32:20 AM 1.43*  0.50 - 1.10 mg/dL Final  25/42/7062 37:62 AM 1.64* 0.50 - 1.10 mg/dL Final  83/15/1761 60:73 PM 1.59* 0.50 - 1.10 mg/dL Final   Patient and husband seem to have very limited understanding of her medical issues and are very difficult historians.   Past Medical History  Diagnosis Date  . Morbid obesity (HCC)   . Hypertension   . CVA (cerebral infarction) 1990's    "writing is not the same since" R leg weakness   . Asthma   . Type II diabetes mellitus (HCC)   . GERD (gastroesophageal reflux disease)   . Stroke (HCC)   . CHF (congestive heart failure) (HCC)     diastolic    Past Surgical History  Procedure Laterality Date  . Incision and drainage of wound Left 08/30/2013    thigh necrotic wound/notes 08/30/2013  . Irrigation and debridement abscess Left 08/30/2013    Procedure: IRRIGATION AND DEBRIDEMENT ABSCESS Left Thigh Necrotic Wound;  Surgeon: [name], MD;  Location: [facility] OR;  Service: General;  Laterality: Left;  . Tee without cardioversion N/A 09/07/2013    Procedure: TRANSESOPHAGEAL ECHOCARDIOGRAM (TEE);  Surgeon: [name], MD;  [facility] ENDOSCOPY;  Service: Cardiovascular;  Laterality: N/A;  . Tee without cardioversion N/A 02/08/2015    Procedure: TRANSESOPHAGEAL ECHOCARDIOGRAM (TEE);  Surgeon: Chilton Si, MD;  Location: [facility] ENDOSCOPY;  Service: Cardiovascular;  Laterality: N/A;  Family History  Problem Relation Age of Onset  . Diabetes Mother   . Kidney disease Mother    Social History:  reports that she has never smoked. She has never used smokeless tobacco. She reports that she does not drink alcohol or use illicit drugs.   Allergies  Allergen Reactions  . Azithromycin Other (See Comments)    Nose bleeding event    Home medications: Prior to Admission medications   Medication Sig Start Date End Date Taking? Authorizing Provider  acetaminophen (TYLENOL) 325 MG tablet Take 650 mg by mouth every 6 (six) hours as needed for mild pain,  fever or headache. 09/09/13   Maryann Mikhail, DO  albuterol (PROVENTIL HFA;VENTOLIN HFA) 108 (90 BASE) MCG/ACT inhaler Inhale 2 puffs into the lungs every 4 (four) hours as needed for wheezing or shortness of breath. 02/21/13   Arthor Captain, PA-C  amLODipine (NORVASC) 10 MG tablet Take 1 tablet (10 mg total) by mouth daily. 09/20/13   Richarda Overlie, MD  arformoterol (BROVANA) 15 MCG/2ML NEBU Take 15 mcg by nebulization every 12 (twelve) hours. 09/09/13   Maryann Mikhail, DO  aspirin EC 325 MG EC tablet Take 1 tablet (325 mg total) by mouth daily. 09/09/13   Maryann Mikhail, DO  atorvastatin (LIPITOR) 20 MG tablet Take 1 tablet (20 mg total) by mouth daily at 6 PM. 09/09/13   Maryann Mikhail, DO  budesonide (PULMICORT) 0.25 MG/2ML nebulizer solution Take 0.25 mg by nebulization every 12 (twelve) hours. 09/09/13   Maryann Mikhail, DO  carvedilol (COREG) 25 MG tablet Take 1 tablet (25 mg total) by mouth 2 (two) times daily with a meal. 02/10/15   Costin Otelia Sergeant, MD  ferrous sulfate 325 (65 FE) MG tablet Take 1 tablet (325 mg total) by mouth 3 (three) times daily with meals. 02/10/15   Costin Otelia Sergeant, MD  furosemide (LASIX) 40 MG tablet Take 3 tablets (120 mg total) by mouth 2 (two) times daily. 02/10/15   Costin Otelia Sergeant, MD  gemfibrozil (LOPID) 600 MG tablet Take 1 tablet (600 mg total) by mouth 2 (two) times daily before a meal. 09/09/13   Maryann Mikhail, DO  insulin aspart (NOVOLOG) 100 UNIT/ML injection Inject 0-20 Units into the skin 3 (three) times daily with meals. CBG < 70: implement hypoglycemia protocol CBG 70 - 120: 0 units CBG 121 - 150: 3 units CBG 151 - 200: 4 units CBG 201 - 250: 7 units CBG 251 - 300: 11 units CBG 301 - 350: 15 units CBG 351 - 400: 20 units 09/09/13   Maryann Mikhail, DO  insulin glargine (LANTUS) 100 UNIT/ML injection Inject 0.15 mLs (15 Units total) into the skin daily. Patient taking differently: Inject 15 Units into the skin at bedtime.  09/20/13   Richarda Overlie, MD     Inpatient medications: . acetaminophen  1,000 mg Oral Once    Review of Systems    Physical Exam:  BP 163/96 mmHg  Pulse 85  Temp(Src) 101.5 F (38.6 C) (Rectal)  Resp 12  Ht 5\' 5"  (1.651 m)  Wt 165.563 kg (365 lb)  BMI 60.74 kg/m2  SpO2 97% (weight at discharge 12/30 368 so 3 lb lower)  Gen: Morbidly obese AAF lying on stretcher - coughing (loose cough) and wheezing Skin: Numerous tattoos Neck: I cannot see her neck veins due to her large beefy neck Chest: Coarse crackles with exp wheezes Heart: regular, no rub or gallop Abdomen: soft, S1S2 No S3 or S4 Ext: 3+ pitting edema to  the knees bilaterally.  Large denuded area R calf with redness and induration surrounding the area of skin tear.  Neuro: alert, Ox3,   Labs: Basic Metabolic Panel:  Recent Labs Lab 02/19/15 1123  NA 137  K 3.5  CL 98*  CO2 25  GLUCOSE 189*  BUN 58*  CREATININE 5.84*  CALCIUM 8.4*    CBC:  Recent Labs Lab 02/19/15 1123  WBC 19.8*  NEUTROABS 15.2*  HGB 10.4*  HCT 32.3*  MCV 82.2  PLT 172    Xrays/Other Studies: Dg Chest Port 1 View  02/19/2015  CLINICAL DATA:  Weakness. EXAM: PORTABLE CHEST 1 VIEW COMPARISON:  02/04/2015 FINDINGS: There is moderate cardiac enlargement. Asymmetric elevation of right hemidiaphragm noted. No pleural effusion or edema identified. IMPRESSION: 1. Cardiac enlargement. Electronically Signed   By: Signa Kellaylor  Stroud M.D.   On: 02/19/2015 11:32   Background: 40 y.o. 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  Recently discharged after admission for dCHF exacerbation, creatinine increase from 2.98->3.74 with diuresis for CHF (lost 30lb) who returns now with febrile illness, cough, SOB, leg ulcer and worsening renal function - creatinine up to 5.84 and we are asked to see.   Impression/Plan  1. AKI on CKD4 - creatinine was still rising when she  left the hospital 8 days ago so I don't know if this is acute or part of the insidious rise she was already experiencing as she was (very appropriately) diuresing. Sepsis could be playing a role here as well. She still has gross anasarca but her CXR is relatively clear  - I wouldn't give her fluids but not unreasonable just to hold her diuretics for a 24-36 hours. (She will ultimately benefit from dialysis to optimally control her volume  - I'm not sure if we can do it with diuretics and still spare renal function but she is very much against any discussion about dialysis right now.  So..hold diuretics a couple of days (but don't "hydrate" ) - see which way this goes 2. Febrile illness - CXR neg for PNA. R calf likely the culprit. ATB's per primary service 3. Acute resp failure - Asthma exacerbation, possible exacerbation dCHF but CXR clear, weight down another 3 lb since discharge on 12/30. I don't think will lose any ground by holding her diuretics for 24-36 hours...clearly has LOTS of extra volume on board - but would see how pulm responds to nebs, steroids 4. Grade 1 DD on ECHO 02/05/15 - unclear how much of her resp sx is asthma exac vs dCHF. See above discussion 1,2 5. Morbid obesity 6. DM2 7. HTN 8. Anemia. Hb>10. No ESA requirement as of yet 9. Reduced health literacy - this pt will require a LOT of education - very limited understanding   Camille Balynthia Shalin Linders,  MD [facility] [phone] pager 02/19/2015, 2:11 PM

## 2015-02-19 NOTE — ED Provider Notes (Signed)
CSN: [id number]     Arrival date & time 02/19/15  1031 History   First [name] Initiated Contact with Patient 02/19/15 1038     Chief Complaint  Patient presents with  . Weakness  . Fever  . Wound Infection    Right lower leg     (Consider location/radiation/quality/duration/timing/severity/associated sxs/prior Treatment) The history is provided by the spouse and medical records.   [name] Is a 40 year old female who  has a past medical history of Linda obesity ([place]); [name]; Linda (cerebral infarction) (1990's); [name]; Linda II diabetes mellitus ([place]); Linda (gastroesophageal reflux disease); [name]); and Linda (congestive heart failure) ([place]).  The patient was discharged on 02/10/2015 for Linda exacerbation with anasarca, acute kidney injury. Patient was brought in by her husband for fever and difficulty breathing. She has had 2 days of fever. Her husband is unsure of what her oral temperature was at home. She has had worsening wheezing, shortness of breath. Patient has been using her albuterol nebulizer treatments at home without relief of her symptoms. Her husband states that she has been too weak to get up and walk. The patient also has a wound to the right posterior calf. The right leg is tender and erythematous. Patient is in mild respiratory distress upon arrival, making history taking difficult from the patient. Husband provides the majority of the history. There is a level V caveat due to respiratory distress.   Past Medical History  Diagnosis Date  . Linda obesity ([name])   . [name]   . Linda (cerebral infarction) 1990's    "writing is not the same since"  . [name]   . Linda II diabetes mellitus ([name])   . Linda (gastroesophageal reflux disease)   . Linda ([name])   . Linda (congestive heart failure) [facility])    Past Surgical History  Procedure Laterality Date  . Incision and drainage of wound Left 08/30/2013    thigh necrotic wound/notes 08/30/2013  . Irrigation  and debridement abscess Left 08/30/2013    Procedure: IRRIGATION AND DEBRIDEMENT ABSCESS Left Thigh Necrotic Wound;  Surgeon: Ralene Ok, [name];  Location: [facility];  Service: General;  Laterality: Left;  . Tee without cardioversion N/A 09/07/2013    Procedure: TRANSESOPHAGEAL ECHOCARDIOGRAM (TEE);  Surgeon: Pixie Casino, [name];  Location: [facility] ENDOSCOPY;  Service: Cardiovascular;  Laterality: N/A;  . Tee without cardioversion N/A 02/08/2015    Procedure: TRANSESOPHAGEAL ECHOCARDIOGRAM (TEE);  Surgeon: [name];  Location: [name] ENDOSCOPY;  Service: Cardiovascular;  Laterality: N/A;   Family History  Problem Relation Age of Onset  . Diabetes Mother   . Kidney disease Mother    Social History  Substance Use Topics  . Smoking status: Never Smoker   . Smokeless tobacco: Never Used  . Alcohol Use: No   OB History    Gravida Para Term Preterm AB TAB SAB Ectopic Multiple Living   _0 0 0 0 0 0 0 3     Review of Systems  Reason unable to perform ROS: Respiratory distress.      Allergies  Azithromycin  Home Medications   Prior to Admission medications   Medication Sig Start Date End Date Taking? Authorizing Provider  acetaminophen (TYLENOL) 325 MG tablet Take 650 mg by mouth every 6 (six) hours as needed for mild pain, fever or headache. 09/09/13   Linda Mikhail, DO  albuterol (PROVENTIL HFA;VENTOLIN HFA) 108 (90 BASE) MCG/ACT inhaler Inhale 2 puffs into the lungs every 4 (four) hours as needed for wheezing  or shortness of breath. 02/21/13   Margarita Mail, PA-C  amLODipine (NORVASC) 10 MG tablet Take 1 tablet (10 mg total) by mouth daily. 09/20/13   [name]  arformoterol (BROVANA) 15 MCG/2ML NEBU Take 15 mcg by nebulization every 12 (twelve) hours. 09/09/13   Linda Mikhail, DO  aspirin EC 325 MG EC tablet Take 1 tablet (325 mg total) by mouth daily. 09/09/13   Linda Mikhail, DO  atorvastatin (LIPITOR) 20 MG tablet Take 1 tablet (20 mg total) by mouth daily at 6 PM.  09/09/13   Linda Mikhail, DO  budesonide (PULMICORT) 0.25 MG/2ML nebulizer solution Take 0.25 mg by nebulization every 12 (twelve) hours. 09/09/13   Linda Mikhail, DO  carvedilol (COREG) 25 MG tablet Take 1 tablet (25 mg total) by mouth 2 (two) times daily with a meal. 02/10/15   [name]  ferrous sulfate 325 (65 FE) MG tablet Take 1 tablet (325 mg total) by mouth 3 (three) times daily with meals. 02/10/15   [name]  furosemide (LASIX) 40 MG tablet Take 3 tablets (120 mg total) by mouth 2 (two) times daily. 02/10/15   [name]  gemfibrozil (LOPID) 600 MG tablet Take 1 tablet (600 mg total) by mouth 2 (two) times daily before a meal. 09/09/13   Linda Mikhail, DO  insulin aspart (NOVOLOG) 100 UNIT/ML injection Inject 0-20 Units into the skin 3 (three) times daily with meals. CBG < 70: implement hypoglycemia protocol CBG 70 - 120: 0 units CBG 121 - 150: 3 units CBG 151 - 200: 4 units CBG 201 - 250: 7 units CBG 251 - 300: 11 units CBG 301 - 350: 15 units CBG 351 - 400: 20 units 09/09/13   Linda Mikhail, DO  insulin glargine (LANTUS) 100 UNIT/ML injection Inject 0.15 mLs (15 Units total) into the skin daily. Patient taking differently: Inject 15 Units into the skin at bedtime.  09/20/13   [name]   BP 167/89 mmHg  Pulse 88  Temp(Src) 101.5 F (38.6 C) (Rectal)  Resp 28  Ht _0  (1.651 m)  Wt 165.563 kg  BMI 60.74 kg/m2  SpO2 90% Physical Exam  Constitutional: She appears well-developed and well-nourished. She appears distressed.  Morbidly obese female in respiratory distress.  HENT:  Head: Normocephalic and atraumatic.  Eyes: EOM are normal. Pupils are equal, round, and reactive to light.  Neck: Normal range of motion.  Cardiovascular: Normal rate and regular rhythm.   Pulmonary/Chest: She is in respiratory distress. She has wheezes. She has rales.  Increased effort. Poor air movement. Rales and wheezes present  Abdominal: There is no  tenderness.  Obese, nontender abdomen. Intertrigo under the lower pannus  Musculoskeletal: She exhibits edema.  Bilateral 3+ pitting edema. There is tenderness reading of the posterior right taking up about one third of the posterior calf. She has redness and tenderness surrounding this area concerning for cellulitis. No streaking.  Skin: Skin is warm.  Nursing note and vitals reviewed.       ED Course  .Critical Care Performed by: Margarita Mail Authorized by: Margarita Mail Total critical care time: 60 minutes Critical care time was exclusive of separately billable procedures and treating other patients. Critical care was necessary to treat or prevent imminent or life-threatening deterioration of the following conditions: respiratory failure and renal failure. Critical care was time spent personally by me on the following activities: development of treatment plan with patient or surrogate, discussions with consultants, interpretation of  cardiac output measurements, evaluation of patient's response to treatment, examination of patient, obtaining history from patient or surrogate, ordering and performing treatments and interventions, ordering and review of laboratory studies, ordering and review of radiographic studies, pulse oximetry, re-evaluation of patient's condition and review of old charts. Subsequent provider of critical care: I assumed direction of critical care for this patient from another provider of my specialty.   (including critical care time) Labs Review Labs Reviewed - No data to display  Imaging Review No results found. I have personally reviewed and evaluated these images and lab results as part of my medical decision-making.   EKG Interpretation None      MDM   Final diagnoses:  AKI (acute kidney injury) ([place])  Respiratory distress  Cellulitis of left lower extremity  Other specified fever  Leukocytosis  Essential [name]    12:10 PM Patient  here with respiratory distress. Question possible HCAP versus Linda exacerbation.  Cellulitis of the right lower extremity. Her chest x-ray is negative for any new infiltrations. I have ordered vancomycin per pharmacy consult. Patient has also received 1 DuoNeb. IV Lasix 40 mg. She has a leukocytosis of nearly 20. Febrile upon arrival. Patient appears to have worsening renal function. Below this a glass of her last 6 metabolic panels. According to her husband and patient left her last admission earlier than the physicians wanted her to. Serum creatinine was trending upward. Her initial acute kidney injury value creatinine was at 2.98. Today, patient's creatinine is nearly 6.   Component     Latest Ref Rng 02/04/2015 02/06/2015 02/07/2015 02/08/2015             Sodium     135 - 145 mmol/L 144 143 140 141  Potassium     3.5 - 5.1 mmol/L 5.0 4.1 4.1 4.0  Chloride     101 - 111 mmol/L 112 (H) 111 107 107  CO2     22 - 32 mmol/L _0 Glucose     65 - 99 mg/dL 117 (H) 66 99 80  BUN     6 - 20 mg/dL 24 (H) 24 (H) 27 (H) 29 (H)  Creatinine     0.44 - 1.00 mg/dL 2.98 (H) 3.17 (H) 3.31 (H) 3.52 (H)  Calcium     8.9 - 10.3 mg/dL 8.9 8.8 (L) 8.4 (L) 8.3 (L)  EGFR (Non-African Amer.)     >60 mL/min 19 (L) 17 (L) 17 (L) 15 (L)    .edmEGFR (African American)     >60 mL/min 22 (L) 20 (L) 19 (L) 18 (L)  Anion gap     5 - _1 Component     Latest Ref Rng 02/10/2015 02/19/2015           Sodium     135 - 145 mmol/L 140 137  Potassium     3.5 - 5.1 mmol/L 3.7 3.5  Chloride     101 - 111 mmol/L 105 98 (L)  CO2     22 - 32 mmol/L 25 25  Glucose     65 - 99 mg/dL 67 189 (H)  BUN     6 - 20 mg/dL 30 (H) 58 (H)  Creatinine     0.44 - 1.00 mg/dL 3.74 (H) 5.84 (H)  Calcium     8.9 - 10.3 mg/dL 8.3 (L) 8.4 (L)  EGFR (Non-African Amer.)     >60 mL/min 14 (L) 8 (L)  EGFR (African American)     >60 mL/min 17 (L) 10 (L)  Anion gap     5 - _0 Patient with Fever,  respiratory distress. Lasix given at initial evaluation. Due to her respiratory distress, and audible rails on examination. Patient treated with albuterol and Lasix. Upon return of patient's lab values. She was found to have further worsening renal function. I suspect this is due to hypoalbuminemia which was part of her diagnosis. At last admission and feels she has difficulty maintaining intravascular volume secondary to low oncotic pressure is. Patient also with a history of diastolic heart failure. I spoken with Dr. Barbaraann Faster, who will admit the patient to stepdown unit. She has some improvement after respiratory interventions. Was still having some difficulty breathing. I begin the patient on vancomycin, she's had a DuoNeb. IV Lasix. Patient will be admitted to stepdown unit.    Medications  acetaminophen (TYLENOL) tablet 1,000 mg (1,000 mg Oral Not Given 02/19/15 1751)  vancomycin (VANCOCIN) 2,000 mg in sodium chloride 0.9 % 500 mL IVPB (not administered)  heparin injection 5,000 Units (5,000 Units Subcutaneous Given 02/21/15 0633)  sodium chloride 0.9 % injection 3 mL (3 mLs Intravenous Not Given 02/21/15 0940)  sodium chloride 0.9 % injection 3 mL (3 mLs Intravenous Given 02/21/15 0939)  sodium chloride 0.9 % injection 3 mL (not administered)  0.9 %  sodium chloride infusion (not administered)  ondansetron (ZOFRAN) tablet 4 mg (not administered)    Or  ondansetron (ZOFRAN) injection 4 mg (not administered)  insulin aspart (novoLOG) injection 0-5 Units (3 Units Subcutaneous Given 02/20/15 2151)  insulin aspart (novoLOG) injection 0-15 Units (11 Units Subcutaneous Given 02/21/15 0802)  methylPREDNISolone sodium succinate (SOLU-MEDROL) 125 mg/2 mL injection 60 mg (60 mg Intravenous Given 02/21/15 0633)  carvedilol (COREG) tablet 25 mg (25 mg Oral Given 02/21/15 0803)  insulin glargine (LANTUS) injection 15 Units (15 Units Subcutaneous Given 02/20/15 2151)  hydrALAZINE (APRESOLINE) injection 10 mg (10 mg  Intravenous Given 02/21/15 0952)  furosemide (LASIX) injection 80 mg (80 mg Intravenous Given 02/21/15 0802)  ipratropium-albuterol (DUONEB) 0.5-2.5 (3) MG/3ML nebulizer solution 3 mL (3 mLs Nebulization Given 02/21/15 0928)  potassium chloride SA (K-DUR,KLOR-CON) CR tablet 40 mEq (not administered)  isosorbide mononitrate (IMDUR) 24 hr tablet 30 mg (30 mg Oral Given 02/21/15 0939)  albuterol (PROVENTIL) (2.5 MG/3ML) 0.083% nebulizer solution 5 mg (5 mg Nebulization Given 02/19/15 1159)  ipratropium (ATROVENT) nebulizer solution 0.5 mg (0.5 mg Nebulization Given 02/19/15 1159)  furosemide (LASIX) injection 40 mg (40 mg Intravenous Given 02/19/15 1159)  vancomycin (VANCOCIN) 2,000 mg in sodium chloride 0.9 % 500 mL IVPB (2,000 mg Intravenous New Bag/Given 02/19/15 1343)  0.9 %  sodium chloride infusion ( Intravenous New Bag/Given 02/19/15 1342)     Margarita Mail, PA-C 02/21/15 [place], [name] 02/23/15 1332

## 2015-02-19 NOTE — ED Notes (Signed)
EMS - Patient coming from home with c/o of weakness.  D/C on 1/117 with CHF with Renal failure.  Patient has been home unable to ambulate.  Family noticed fever at home.  EMS temporal was 101.  Decreased PO intake, nonproductive cough.  Lungs sounds clear.  Patient has a wound drainage to the right posterior calve.  GCS 15.

## 2015-02-20 ENCOUNTER — Inpatient Hospital Stay (HOSPITAL_COMMUNITY): Payer: Medicaid Other

## 2015-02-20 DIAGNOSIS — N179 Acute kidney failure, unspecified: Principal | ICD-10-CM

## 2015-02-20 LAB — GLUCOSE, CAPILLARY
GLUCOSE-CAPILLARY: 250 mg/dL — AB (ref 65–99)
Glucose-Capillary: 223 mg/dL — ABNORMAL HIGH (ref 65–99)
Glucose-Capillary: 258 mg/dL — ABNORMAL HIGH (ref 65–99)
Glucose-Capillary: 284 mg/dL — ABNORMAL HIGH (ref 65–99)

## 2015-02-20 LAB — CBC
HCT: 26.8 % — ABNORMAL LOW (ref 36.0–46.0)
Hemoglobin: 8.8 g/dL — ABNORMAL LOW (ref 12.0–15.0)
MCH: 26.5 pg (ref 26.0–34.0)
MCHC: 32.8 g/dL (ref 30.0–36.0)
MCV: 80.7 fL (ref 78.0–100.0)
PLATELETS: 191 10*3/uL (ref 150–400)
RBC: 3.32 MIL/uL — AB (ref 3.87–5.11)
RDW: 14.6 % (ref 11.5–15.5)
WBC: 21 10*3/uL — ABNORMAL HIGH (ref 4.0–10.5)

## 2015-02-20 LAB — RENAL FUNCTION PANEL
ANION GAP: 16 — AB (ref 5–15)
Albumin: 2 g/dL — ABNORMAL LOW (ref 3.5–5.0)
BUN: 67 mg/dL — ABNORMAL HIGH (ref 6–20)
CHLORIDE: 99 mmol/L — AB (ref 101–111)
CO2: 23 mmol/L (ref 22–32)
CREATININE: 5.83 mg/dL — AB (ref 0.44–1.00)
Calcium: 8.2 mg/dL — ABNORMAL LOW (ref 8.9–10.3)
GFR calc non Af Amer: 8 mL/min — ABNORMAL LOW (ref >60)
GFR, EST AFRICAN AMERICAN: 10 mL/min — AB (ref >60)
GLUCOSE: 239 mg/dL — AB (ref 65–99)
Phosphorus: 4.5 mg/dL (ref 2.5–4.6)
Potassium: 3.4 mmol/L — ABNORMAL LOW (ref 3.5–5.1)
Sodium: 138 mmol/L (ref 135–145)

## 2015-02-20 LAB — C DIFFICILE QUICK SCREEN W PCR REFLEX
C DIFFICILE (CDIFF) TOXIN: NEGATIVE
C DIFFICLE (CDIFF) ANTIGEN: NEGATIVE
C Diff interpretation: NEGATIVE

## 2015-02-20 MED ORDER — IPRATROPIUM-ALBUTEROL 0.5-2.5 (3) MG/3ML IN SOLN
3.0000 mL | Freq: Three times a day (TID) | RESPIRATORY_TRACT | Status: DC
  Administered 2015-02-21 – 2015-02-24 (×8): 3 mL via RESPIRATORY_TRACT
  Filled 2015-02-20 (×9): qty 3

## 2015-02-20 MED ORDER — FUROSEMIDE 10 MG/ML IJ SOLN
80.0000 mg | Freq: Two times a day (BID) | INTRAMUSCULAR | Status: DC
  Administered 2015-02-20 – 2015-02-27 (×15): 80 mg via INTRAVENOUS
  Filled 2015-02-20 (×15): qty 8

## 2015-02-20 MED ORDER — ISOSORBIDE MONONITRATE ER 30 MG PO TB24
15.0000 mg | ORAL_TABLET | Freq: Every day | ORAL | Status: DC
  Administered 2015-02-20: 15 mg via ORAL
  Filled 2015-02-20: qty 1

## 2015-02-20 NOTE — Progress Notes (Signed)
[facility] PROGRESS NOTE  Neva SeatDavenia Huot ZOX:[id number] DOB: 01/17/1976 DOA: 02/19/2015 PCP: Lonia BloodGARBA,LAWAL, MD  Assessment/Plan: 1. Acute on chronic renal failure -Patient having a history of stage IV chronic kidney disease, recently hospitalized for acute on chronic renal failure, on 02/10/2015 had a creatinine of 3.74 with BUN of 30. She presents with generalized weakness and fatigue, with labs showing creatinine of 5.84 with BUN of 58 -She was seen and evaluated by nephrology recommending holding Lasix therapy, which she had been taking 120 mg twice daily. -On recent hospitalization had extensive workup for renal failure which was unrevealing. -I suspect hemodialysis is a future intervention although it appears patient agreeable to this at the time being. -Follow kidney function. Await further recommendations from nephrology.  2.  Possible right calf cellulitis -She was found to have a 6 cm x 4 cm skin tear in the right calf with some associated erythema. Labs revealed a white count of 19,800 having a temperature of 101.5 -She was started on empiric IV antibiotic therapy with vancomycin  3.  Acute respiratory failure -Evidenced by a respiratory rate of 25 having an O2 sat of 89% on 2 L supplemental oxygen. -Chest x-ray did not show acute infiltrate or findings suggestive of acute CHF. -Suspect could be related to acute asthmatic exacerbation or perhaps obesity hypoventilation syndrome. -She was started on systemic steroids  4.  History of diastolic congestive heart failure -Last transthoracic echocardiogram was performed on 02/05/2015 that showed a preserved ejection fraction with grade 1 diastolic dysfunction. -As mentioned above due to acute on chronic renal failure her Lasix will be held. She was previously taking 120 mg by mouth twice a day. -Will closely monitor volume status.  5.  Insulin-dependent diabetes mellitus. -Blood sugars are controlled, continue Lantus 15  units subcutaneous at bedtime with sliding scale coverage.  6.  Hypertension. -Blood pressures remain elevated with systolic blood pressures in the 160s to 180s -Continue Coreg 25 mg by mouth twice a day, and will add Imdur 15 mg PO q daily  Code Status: Full code Family Communication: Family not present Disposition Plan: Will continue close monitoring in the step down unit   Consultants:  Nephrology  Antibiotics:  Vancomycin  HPI/Subjective: [name] is a 40 year old female with a past medical history of morbid obesity having multiple comorbidities including congestive heart failure, hypertension, diabetes mellitus, stage IV chronic kidney disease presented to the emergency department with complaints of generalized weakness, fatigue, minimal by mouth intake and having overall functional decline.Marland Kitchen. She was recently discharged from the medicine service on 02/10/2015 at which time she was treated for acute on chronic diastolic congestive heart failure and acute on chronic renal failure. Lab work performed in the emergency department showed upward trend in her creatinine from 3.74 on 02/10/2015 to 5.84 on presentation. She was seen and evaluated by nephrology. Lasix was held. She was also found to have a skin tear in her right calf region.  Objective: Filed Vitals:   02/20/15 0500 02/20/15 0600  BP: 164/81 172/85  Pulse: 80 81  Temp:    Resp: 23 27   No intake or output data in the 24 hours ending 02/20/15 0716 Filed Weights   02/19/15 1040 02/20/15 0334  Weight: 165.563 kg (365 lb) 183.707 kg (405 lb)    Exam:   General:  Morbidly obese, does not appear to be in acute distress awake and alert, poor historian  Cardiovascular: Regular rate rhythm normal S1-S2 no murmurs or gallops  Respiratory: Diminished breath  sounds bilaterally, could be from body habitus, no wheezing rhonchi or rales  Abdomen: Obese, soft nontender nondistended  Musculoskeletal: On her right  lower extremity she has a 6 cm x 4 cm skin tear, there is mild surrounding erythema, no purulence or fluctuance noted  Data Reviewed: Basic Metabolic Panel:  Recent Labs Lab 02/19/15 1123 02/20/15 0300  NA 137 138  K 3.5 3.4*  CL 98* 99*  CO2 25 23  GLUCOSE 189* 239*  BUN 58* 67*  CREATININE 5.84* 5.83*  CALCIUM 8.4* 8.2*  PHOS  --  4.5   Liver Function Tests:  Recent Labs Lab 02/20/15 0300  ALBUMIN 2.0*   No results for input(s): LIPASE, AMYLASE in the last 168 hours. No results for input(s): AMMONIA in the last 168 hours. CBC:  Recent Labs Lab 02/19/15 1123 02/20/15 0300  WBC 19.8* 21.0*  NEUTROABS 15.2*  --   HGB 10.4* 8.8*  HCT 32.3* 26.8*  MCV 82.2 80.7  PLT 172 191   Cardiac Enzymes: No results for input(s): CKTOTAL, CKMB, CKMBINDEX, TROPONINI in the last 168 hours. BNP (last 3 results)  Recent Labs  02/04/15 0031 02/19/15 1123  BNP 349.2* 921.9*    ProBNP (last 3 results) No results for input(s): PROBNP in the last 8760 hours.  CBG:  Recent Labs Lab 02/19/15 1852 02/19/15 2147  GLUCAP 172* 152*    No results found for this or any previous visit (from the past 240 hour(s)).   Studies: Dg Chest Port 1 View  02/19/2015  CLINICAL DATA:  Weakness. EXAM: PORTABLE CHEST 1 VIEW COMPARISON:  02/04/2015 FINDINGS: There is moderate cardiac enlargement. Asymmetric elevation of right hemidiaphragm noted. No pleural effusion or edema identified. IMPRESSION: 1. Cardiac enlargement. Electronically Signed   By: Signa Kell M.D.   On: 02/19/2015 11:32    Scheduled Meds: . acetaminophen  1,000 mg Oral Once  . carvedilol  25 mg Oral BID WC  . heparin  5,000 Units Subcutaneous 3 times per day  . insulin aspart  0-15 Units Subcutaneous TID WC  . insulin aspart  0-5 Units Subcutaneous QHS  . insulin glargine  15 Units Subcutaneous QHS  . ipratropium-albuterol  3 mL Nebulization Q4H  . methylPREDNISolone (SOLU-MEDROL) injection  60 mg Intravenous Q6H   . sodium chloride  3 mL Intravenous Q12H  . sodium chloride  3 mL Intravenous Q12H  . [START ON 02/21/2015] vancomycin  2,000 mg Intravenous Q48H   Continuous Infusions:   Principal Problem:   Acute respiratory failure with hypoxia (HCC) Active Problems:   Diabetes mellitus with hyperglycemia (HCC)   AKI (acute kidney injury) (HCC)   Chronic kidney disease (CKD), stage IV (severe) (HCC)   Cellulitis   History of stroke   Asthma exacerbation   Acute diastolic (congestive) heart failure (HCC)    Time spent: 35 minutes    Jeralyn Bennett  [facility] Pager [phone]. If 7PM-7AM, please contact night-coverage at [email or web address], password [facility] 02/20/2015, 7:16 AM  LOS: 1 day

## 2015-02-20 NOTE — Care Management Note (Signed)
Case Management Note  Patient Details  Name: Neva SeatDavenia Pate MRN: [id number] Date of Birth: 07/14/1975  Subjective/Objective:       Adm w fever,weakness             Action/Plan: lives w husband, pcp dr Mikeal Hawthornegarba   Expected Discharge Date:                  Expected Discharge Plan:     In-House Referral:     Discharge planning Services     Post Acute Care Choice:    Choice offered to:     DME Arranged:    DME Agency:     HH Arranged:    HH Agency:     Status of Service:     Medicare Important Message Given:    Date Medicare IM Given:    Medicare IM give by:    Date Additional Medicare IM Given:    Additional Medicare Important Message give by:     If discussed at Long Length of Stay Meetings, dates discussed:    Additional Comments: ur review done  Hanley HaysDowell, Rayquon Uselman T, RN 02/20/2015, 8:53 AM

## 2015-02-20 NOTE — Progress Notes (Signed)
[facility] Progress Note    Assessment/ Plan:   Background: 40 y.o. 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 Recently discharged after admission for dCHF exacerbation, creatinine increase from 2.98->3.74 with diuresis for CHF (lost 30lb) who returns now with febrile illness, cough, SOB, leg ulcer and worsening renal function - creatinine up to 5.84 and we are asked to see.   Impression/Plan  1. AKI on CKD4 - creatinine was still rising when she left the hospital 8 days ago so I don't know if this is acute or part of the insidious rise she was already experiencing as she was (very appropriately) diuresing. Sepsis could be playing a role here as well. She still has gross anasarca but her CXR is relatively clear - I wonder if she wasn't responding to the oral diuretics because of gut edema bec she has anasarca --> will restart Lasix 80mg  twice daily IV and monitor response. She's not making much urine and although renal function is stabilizing she already has a long ways to go with the fluid onboard. Hopefully by improving her volume status her cardiac function / cardio- renal syndrome will improve as well. - She will ultimately benefit from dialysis to optimally control her volume - I'm not sure if we can do it with diuretics and still spare renal function but she is very much against any discussion about dialysis right now.  - Needs a better weight; she thinks she could stand for a floor scale. 2. Febrile illness - CXR neg for PNA. R calf likely the culprit. ATB's per primary service 3. Acute resp failure - Asthma exacerbation, possible exacerbation dCHF but CXR clear, weight down another 3 lb since discharge on 12/30. I don't think will lose any ground by holding her diuretics for 24-36 hours...clearly has LOTS of extra volume on board - but would see how pulm  responds to nebs, steroids 4. Grade 1 DD on ECHO 02/05/15 - unclear how much of her resp sx is asthma exac vs dCHF. See above discussion 1,2 5. Morbid obesity 6. DM2 7. HTN 8. Anemia. Hb>10. No ESA requirement as of yet 9. Reduced health literacy - this pt will require a LOT of education - very limited understanding  Subjective:   Feels breathing is about the same although she has a cough productive of only whitish sputum. Does not think she is voiding much. Denies fever, chills, nausea. Some diarrhea this am.   Objective:   BP 158/83 mmHg  Pulse 86  Temp(Src) 98.5 F (36.9 C) (Oral)  Resp 26  Ht 5\' 5"  (1.651 m)  Wt 183.707 kg (405 lb)  BMI 67.40 kg/m2  SpO2 94% No intake or output data in the 24 hours ending 02/20/15 0858 Weight change:   Physical Exam:  Gen: Morbidly obese AAF lying on stretcher - coughing (loose cough) and wheezing Skin: Numerous tattoos Neck: I cannot see her neck veins due to her large beefy neck Chest: Coarse crackles with exp wheezes Heart: regular, no rub or gallop Abdomen: soft, S1S2 No S3 or S4 Ext: 3+ pitting edema to the knees bilaterally.  Large denuded area R calf with redness and induration surrounding the area of skin tear.  Neuro: alert, Ox3 GU: no foley  Imaging: Dg Chest Port 1 View  02/20/2015  CLINICAL DATA:  Acute respiratory failure. EXAM: PORTABLE CHEST 1 VIEW COMPARISON:  02/19/2015.  FINDINGS: Mediastinum and hilar structures normal. Cardiomegaly with pulmonary venous congestion and bilateral pulmonary infiltrates consistent congestive heart failure. Small left pleural effusion cannot be excluded. No pneumothorax. IMPRESSION: Progressive changes of congestive heart failure with bilateral pulmonary edema. Small left pleural effusion cannot be excluded. Electronically Signed   By: Maisie Fus  Register   On: 02/20/2015 07:48   Dg Chest Port 1 View  02/19/2015  CLINICAL DATA:  Weakness. EXAM: PORTABLE CHEST 1 VIEW COMPARISON:  02/04/2015  FINDINGS: There is moderate cardiac enlargement. Asymmetric elevation of right hemidiaphragm noted. No pleural effusion or edema identified. IMPRESSION: 1. Cardiac enlargement. Electronically Signed   By: Signa Kell M.D.   On: 02/19/2015 11:32    Labs: BMET  Recent Labs Lab 02/19/15 1123 02/20/15 0300  NA 137 138  K 3.5 3.4*  CL 98* 99*  CO2 25 23  GLUCOSE 189* 239*  BUN 58* 67*  CREATININE 5.84* 5.83*  CALCIUM 8.4* 8.2*  PHOS  --  4.5   CBC  Recent Labs Lab 02/19/15 1123 02/20/15 0300  WBC 19.8* 21.0*  NEUTROABS 15.2*  --   HGB 10.4* 8.8*  HCT 32.3* 26.8*  MCV 82.2 80.7  PLT 172 191    Medications:    . acetaminophen  1,000 mg Oral Once  . carvedilol  25 mg Oral BID WC  . heparin  5,000 Units Subcutaneous 3 times per day  . insulin aspart  0-15 Units Subcutaneous TID WC  . insulin aspart  0-5 Units Subcutaneous QHS  . insulin glargine  15 Units Subcutaneous QHS  . ipratropium-albuterol  3 mL Nebulization Q4H  . isosorbide mononitrate  15 mg Oral Daily  . methylPREDNISolone (SOLU-MEDROL) injection  60 mg Intravenous Q6H  . sodium chloride  3 mL Intravenous Q12H  . sodium chloride  3 mL Intravenous Q12H  . [START ON 02/21/2015] vancomycin  2,000 mg Intravenous Q48H      Paulene Floor, MD 02/20/2015, 8:58 AM

## 2015-02-20 NOTE — Progress Notes (Signed)
Notified [facility] NP of patient's BP maintaining 160-170's over 90's. New orders received for prn hydralazine.

## 2015-02-21 ENCOUNTER — Encounter (HOSPITAL_COMMUNITY): Payer: Self-pay | Admitting: General Surgery

## 2015-02-21 DIAGNOSIS — L03115 Cellulitis of right lower limb: Secondary | ICD-10-CM

## 2015-02-21 LAB — BASIC METABOLIC PANEL
ANION GAP: 14 (ref 5–15)
ANION GAP: 13 (ref 5–15)
BUN: 82 mg/dL — ABNORMAL HIGH (ref 6–20)
BUN: 81 mg/dL — AB (ref 6–20)
CALCIUM: 8.3 mg/dL — AB (ref 8.9–10.3)
CHLORIDE: 98 mmol/L — AB (ref 101–111)
CO2: 24 mmol/L (ref 22–32)
CO2: 24 mmol/L (ref 22–32)
CREATININE: 5.73 mg/dL — AB (ref 0.44–1.00)
Calcium: 8.8 mg/dL — ABNORMAL LOW (ref 8.9–10.3)
Chloride: 99 mmol/L — ABNORMAL LOW (ref 101–111)
Creatinine, Ser: 5.96 mg/dL — ABNORMAL HIGH (ref 0.44–1.00)
GFR calc Af Amer: 10 mL/min — ABNORMAL LOW (ref >60)
GFR calc non Af Amer: 9 mL/min — ABNORMAL LOW (ref >60)
GFR, EST AFRICAN AMERICAN: 9 mL/min — AB (ref >60)
GFR, EST NON AFRICAN AMERICAN: 8 mL/min — AB (ref >60)
GLUCOSE: 384 mg/dL — AB (ref 65–99)
GLUCOSE: 366 mg/dL — AB (ref 65–99)
POTASSIUM: 2.7 mmol/L — AB (ref 3.5–5.1)
Potassium: 2.9 mmol/L — ABNORMAL LOW (ref 3.5–5.1)
SODIUM: 137 mmol/L (ref 135–145)
Sodium: 135 mmol/L (ref 135–145)

## 2015-02-21 LAB — GLUCOSE, CAPILLARY
GLUCOSE-CAPILLARY: 346 mg/dL — AB (ref 65–99)
GLUCOSE-CAPILLARY: 371 mg/dL — AB (ref 65–99)
GLUCOSE-CAPILLARY: 319 mg/dL — AB (ref 65–99)
Glucose-Capillary: 312 mg/dL — ABNORMAL HIGH (ref 65–99)

## 2015-02-21 LAB — CBC
HCT: 29.2 % — ABNORMAL LOW (ref 36.0–46.0)
HEMOGLOBIN: 9.5 g/dL — AB (ref 12.0–15.0)
MCH: 26 pg (ref 26.0–34.0)
MCHC: 32.5 g/dL (ref 30.0–36.0)
MCV: 80 fL (ref 78.0–100.0)
Platelets: 240 10*3/uL (ref 150–400)
RBC: 3.65 MIL/uL — ABNORMAL LOW (ref 3.87–5.11)
RDW: 14.4 % (ref 11.5–15.5)
WBC: 22.1 10*3/uL — ABNORMAL HIGH (ref 4.0–10.5)

## 2015-02-21 LAB — HIV ANTIBODY (ROUTINE TESTING W REFLEX): HIV Screen 4th Generation wRfx: NONREACTIVE

## 2015-02-21 MED ORDER — INSULIN GLARGINE 100 UNIT/ML ~~LOC~~ SOLN
20.0000 [IU] | Freq: Every day | SUBCUTANEOUS | Status: DC
  Administered 2015-02-22: 20 [IU] via SUBCUTANEOUS
  Filled 2015-02-21 (×2): qty 0.2

## 2015-02-21 MED ORDER — INSULIN ASPART 100 UNIT/ML ~~LOC~~ SOLN
0.0000 [IU] | Freq: Three times a day (TID) | SUBCUTANEOUS | Status: DC
  Administered 2015-02-21 – 2015-02-22 (×2): 15 [IU] via SUBCUTANEOUS
  Administered 2015-02-22 (×2): 11 [IU] via SUBCUTANEOUS
  Administered 2015-02-23: 15 [IU] via SUBCUTANEOUS
  Administered 2015-02-23: 20 [IU] via SUBCUTANEOUS
  Administered 2015-02-23: 15 [IU] via SUBCUTANEOUS
  Administered 2015-02-24 (×3): 11 [IU] via SUBCUTANEOUS
  Administered 2015-02-25: 7 [IU] via SUBCUTANEOUS
  Administered 2015-02-25: 4 [IU] via SUBCUTANEOUS
  Administered 2015-02-25: 7 [IU] via SUBCUTANEOUS
  Administered 2015-02-26 – 2015-02-27 (×5): 4 [IU] via SUBCUTANEOUS

## 2015-02-21 MED ORDER — ISOSORBIDE MONONITRATE ER 30 MG PO TB24
30.0000 mg | ORAL_TABLET | Freq: Every day | ORAL | Status: DC
  Administered 2015-02-21 – 2015-02-27 (×7): 30 mg via ORAL
  Filled 2015-02-21 (×7): qty 1

## 2015-02-21 MED ORDER — POTASSIUM CHLORIDE CRYS ER 20 MEQ PO TBCR: 40.0000 meq | EXTENDED_RELEASE_TABLET | Freq: Four times a day (QID) | ORAL | Status: DC

## 2015-02-21 MED ORDER — INSULIN ASPART 100 UNIT/ML ~~LOC~~ SOLN
0.0000 [IU] | Freq: Every day | SUBCUTANEOUS | Status: DC
  Administered 2015-02-22: 5 [IU] via SUBCUTANEOUS
  Administered 2015-02-22 – 2015-02-23 (×2): 4 [IU] via SUBCUTANEOUS
  Administered 2015-02-24 – 2015-02-26 (×2): 2 [IU] via SUBCUTANEOUS

## 2015-02-21 MED ORDER — POTASSIUM CHLORIDE CRYS ER 20 MEQ PO TBCR
40.0000 meq | EXTENDED_RELEASE_TABLET | Freq: Four times a day (QID) | ORAL | Status: AC
Start: 2015-02-21 — End: 2015-02-22
  Administered 2015-02-21 – 2015-02-22 (×3): 40 meq via ORAL
  Filled 2015-02-21 (×3): qty 2

## 2015-02-21 MED ORDER — POTASSIUM CHLORIDE 10 MEQ/100ML IV SOLN
10.0000 meq | INTRAVENOUS | Status: AC
  Administered 2015-02-21 – 2015-02-22 (×3): 10 meq via INTRAVENOUS
  Filled 2015-02-21 (×2): qty 100

## 2015-02-21 NOTE — Progress Notes (Addendum)
[facility] PROGRESS NOTE  [name] ZOX:[id number] DOB: 07-09-1975 DOA: 02/19/2015 PCP: Lonia Blood, MD  Assessment/Plan: 1. Acute on chronic renal failure -Patient having a history of stage IV chronic kidney disease, recently hospitalized for acute on chronic renal failure, on 02/10/2015 had a creatinine of 3.74 with BUN of 30. She presents with generalized weakness and fatigue, with labs showing creatinine of 5.84 with BUN of 58 -She was seen and evaluated by nephrology recommending holding Lasix therapy, which she had been taking 120 mg twice daily. -On recent hospitalization had extensive workup for renal failure which was unrevealing. -I suspect hemodialysis is a future intervention although it appears patient agreeable to this at the time being. -Repeat labs on 02/21/2015 showing upward trend in creatinine 5.83>5.96. Her lasix was restarted on 02/20/2015 at 80 mg IV BID   2.  Right calf cellulitis -She was found to have a 8 cm x 4 cm skin tear in the right calf with some associated erythema. Labs revealed a white count of 19,800 having a temperature of 101.5 raising concern for infection -She was started on empiric IV antibiotic therapy with vancomycin  -White count continues to rise 19.8>21>22.1, though has been afebrile for the past 24 hours. Will consult wound care and general surgery for an opinion of her wound. Meanwhile continue her current AB regimen.   3.  Acute respiratory failure -Evidenced by a respiratory rate of 25 having an O2 sat of 89% on 2 L supplemental oxygen. -Chest x-ray did not show acute infiltrate or findings suggestive of acute CHF. -Suspect could be related to acute asthmatic exacerbation or perhaps obesity hypoventilation syndrome. -She was started on systemic steroids  4.  History of diastolic congestive heart failure -Last transthoracic echocardiogram was performed on 02/05/2015 that showed a preserved ejection fraction with grade 1 diastolic  dysfunction. -As mentioned above due to acute on chronic renal failure her Lasix will be held. She was previously taking 120 mg by mouth twice a day. -Lasix was restarted on 02/20/2015 at 80 mg IV BID by nephrology. Watching kidney function. Suspect will likely need HD.  -Will closely monitor volume status.  5.  Insulin-dependent diabetes mellitus. -Hyperglycemia likely secondary to steroids -Increasing Lantus from 15 to 20 units [place] q daily and changing SSI to resistant coverage.   6.  Hypertension. -Blood pressures remain elevated with systolic blood pressures in the 160s to 180s -Continue Coreg 25 mg by mouth twice a day -Blood pressures remain elevated. Will increase Imdur to 30 mg PO q daily  7. Hypokalemia -AM labs on 02/21/2015 showing Potassium of 2.9 for which she was given Kdur 40 meq PO q 6 hours x 3 doses  Code Status: Full code Family Communication: I spoke with her husband who was present at bedside.  Disposition Plan: She remains hemodynamically stable. Will transfer out of SDU   Consultants:  Nephrology  Antibiotics:  Vancomycin  HPI/Subjective: [name] is a 40 year old female with a past medical history of morbid obesity having multiple comorbidities including congestive heart failure, hypertension, diabetes mellitus, stage IV chronic kidney disease presented to the emergency department with complaints of generalized weakness, fatigue, minimal by mouth intake and having overall functional decline.Marland Kitchen She was recently discharged from the medicine service on 02/10/2015 at which time she was treated for acute on chronic diastolic congestive heart failure and acute on chronic renal failure. Lab work performed in the emergency department showed upward trend in her creatinine from 3.74 on 02/10/2015 to 5.84 on presentation. She  was seen and evaluated by nephrology. Lasix was held. She was also found to have a skin tear in her right calf region.  Objective: Filed  Vitals:   02/21/15 0400 02/21/15 0737  BP: 171/87 180/82  Pulse: 80 80  Temp:  98 F (36.7 C)  Resp: 24 24    Intake/Output Summary (Last 24 hours) at 02/21/15 0800 Last data filed at 02/21/15 0500  Gross per 24 hour  Intake    560 ml  Output      0 ml  Net    560 ml   Filed Weights   02/20/15 0334 02/20/15 1400 02/21/15 0500  Weight: 183.707 kg (405 lb) 153.9 kg (339 lb 4.6 oz) 153.9 kg (339 lb 4.6 oz)    Exam:   General:  Morbidly obese, does not appear to be in acute distress awake and alert, poor historian  Cardiovascular: Regular rate rhythm normal S1-S2 no murmurs or gallops  Respiratory: Diminished breath sounds bilaterally, could be from body habitus, no wheezing rhonchi or rales  Abdomen: Obese, soft nontender nondistended  Musculoskeletal: On her right lower extremity she has a 6 cm x 4 cm skin tear, there is mild surrounding erythema, no purulence or fluctuance noted  Data Reviewed: Basic Metabolic Panel:  Recent Labs Lab 02/19/15 1123 02/20/15 0300 02/21/15 0600  NA 137 138 137  K 3.5 3.4* 2.9*  CL 98* 99* 99*  CO2 25 23 24   GLUCOSE 189* 239* 384*  BUN 58* 67* 82*  CREATININE 5.84* 5.83* 5.96*  CALCIUM 8.4* 8.2* 8.3*  PHOS  --  4.5  --    Liver Function Tests:  Recent Labs Lab 02/20/15 0300  ALBUMIN 2.0*   No results for input(s): LIPASE, AMYLASE in the last 168 hours. No results for input(s): AMMONIA in the last 168 hours. CBC:  Recent Labs Lab 02/19/15 1123 02/20/15 0300 02/21/15 0600  WBC 19.8* 21.0* 22.1*  NEUTROABS 15.2*  --   --   HGB 10.4* 8.8* 9.5*  HCT 32.3* 26.8* 29.2*  MCV 82.2 80.7 80.0  PLT 172 191 PENDING   Cardiac Enzymes: No results for input(s): CKTOTAL, CKMB, CKMBINDEX, TROPONINI in the last 168 hours. BNP (last 3 results)  Recent Labs  02/04/15 0031 02/19/15 1123  BNP 349.2* 921.9*    ProBNP (last 3 results) No results for input(s): PROBNP in the last 8760 hours.  CBG:  Recent Labs Lab  02/19/15 2147 02/20/15 0726 02/20/15 1141 02/20/15 1700 02/20/15 2124  GLUCAP 152* 223* 258* 250* 284*    Recent Results (from the past 240 hour(s))  Culture, blood (routine x 2)     Status: None (Preliminary result)   Collection Time: 02/19/15 12:53 PM  Result Value Ref Range Status   Specimen Description BLOOD RIGHT FOREARM  Final   Special Requests BOTTLES DRAWN AEROBIC AND ANAEROBIC  Final   Culture NO GROWTH 1 DAY  Final   Report Status PENDING  Incomplete  Culture, blood (routine x 2)     Status: None (Preliminary result)   Collection Time: 02/19/15  1:00 PM  Result Value Ref Range Status   Specimen Description BLOOD LEFT ANTECUBITAL  Final   Special Requests BOTTLES DRAWN AEROBIC AND ANAEROBIC  Final   Culture NO GROWTH 1 DAY  Final   Report Status PENDING  Incomplete  C difficile quick scan w PCR reflex     Status: None   Collection Time: 02/20/15  2:14 PM  Result Value Ref Range Status  C Diff antigen NEGATIVE NEGATIVE Final   C Diff toxin NEGATIVE NEGATIVE Final   C Diff interpretation Negative for toxigenic C. difficile  Final     Studies: Dg Chest Port 1 View  02/20/2015  CLINICAL DATA:  Acute respiratory failure. EXAM: PORTABLE CHEST 1 VIEW COMPARISON:  02/19/2015. FINDINGS: Mediastinum and hilar structures normal. Cardiomegaly with pulmonary venous congestion and bilateral pulmonary infiltrates consistent congestive heart failure. Small left pleural effusion cannot be excluded. No pneumothorax. IMPRESSION: Progressive changes of congestive heart failure with bilateral pulmonary edema. Small left pleural effusion cannot be excluded. Electronically Signed   By: Maisie Fushomas  Register   On: 02/20/2015 07:48   Dg Chest Port 1 View  02/19/2015  CLINICAL DATA:  Weakness. EXAM: PORTABLE CHEST 1 VIEW COMPARISON:  02/04/2015 FINDINGS: There is moderate cardiac enlargement. Asymmetric elevation of right hemidiaphragm noted. No pleural effusion or edema identified.  IMPRESSION: 1. Cardiac enlargement. Electronically Signed   By: Signa Kellaylor  Stroud M.D.   On: 02/19/2015 11:32    Scheduled Meds: . acetaminophen  1,000 mg Oral Once  . carvedilol  25 mg Oral BID WC  . furosemide  80 mg Intravenous BID  . heparin  5,000 Units Subcutaneous 3 times per day  . insulin aspart  0-15 Units Subcutaneous TID WC  . insulin aspart  0-5 Units Subcutaneous QHS  . insulin glargine  15 Units Subcutaneous QHS  . ipratropium-albuterol  3 mL Nebulization TID  . isosorbide mononitrate  15 mg Oral Daily  . methylPREDNISolone (SOLU-MEDROL) injection  60 mg Intravenous Q6H  . potassium chloride  40 mEq Oral Q6H  . sodium chloride  3 mL Intravenous Q12H  . sodium chloride  3 mL Intravenous Q12H  . vancomycin  2,000 mg Intravenous Q48H   Continuous Infusions:   Principal Problem:   Acute respiratory failure with hypoxia (HCC) Active Problems:   Diabetes mellitus with hyperglycemia (HCC)   AKI (acute kidney injury) (HCC)   Chronic kidney disease (CKD), stage IV (severe) (HCC)   Cellulitis   History of stroke   Asthma exacerbation   Acute diastolic (congestive) heart failure (HCC)    Time spent: 35 minutes    Jeralyn BennettZAMORA, Shebra Muldrow  [facility] Pager [phone]. If 7PM-7AM, please contact night-coverage at [email or web address], password [email or web address] 02/21/2015, 8:00 AM  LOS: 2 days

## 2015-02-21 NOTE — Progress Notes (Signed)
Inpatient Diabetes Program Recommendations  AACE/ADA: New Consensus Statement on Inpatient Glycemic Control (2015)  Target Ranges:  Prepandial:   less than 140 mg/dL      Peak postprandial:   less than 180 mg/dL (1-2 hours)      Critically ill patients:  140 - 180 mg/dL   Review of Glycemic Control  Inpatient Diabetes Program Recommendations:  Correction (SSI): increase to resistant scale during steroid therapy Thank you  Vernell Townley BSN, RN,CDE Inpatient Diabetes Coordinator [phone] (team pager)    

## 2015-02-21 NOTE — Consult Note (Signed)
[name] 14-Dec-1975  [id number].   Requesting MD: Dr. Kelvin Cellar Chief Complaint/Reason for Consult: leg wound HPI: This is a 40 yo with multiple medical problems who is in the hospital for leukocytosis and other medical problems.  She was noted to have a wound on the posterior aspect of her right calf.  This has been present for at least a couple of weeks.  She slides her legs across the bed and she states her legs rub together.  On admission, this wound was found and we have been asked to evaluate this to determine if she needed debridement or may account for her increasing WBCs.  ROS : Please see HPI  Family History  Problem Relation Age of Onset  . Diabetes Mother   . Kidney disease Mother     Past Medical History  Diagnosis Date  . Morbid obesity ([place])   . Hypertension   . CVA (cerebral infarction) 1990's    "writing is not the same since" R leg weakness   . Asthma   . Type II diabetes mellitus ([place])   . GERD (gastroesophageal reflux disease)   . Stroke ([place])   . CHF (congestive heart failure) ([facility])     diastolic    Past Surgical History  Procedure Laterality Date  . Incision and drainage of wound Left 08/30/2013    thigh necrotic wound/notes 08/30/2013  . Irrigation and debridement abscess Left 08/30/2013    Procedure: IRRIGATION AND DEBRIDEMENT ABSCESS Left Thigh Necrotic Wound;  Surgeon: Ralene Ok, MD;  Location: [facility];  Service: General;  Laterality: Left;  . Tee without cardioversion N/A 09/07/2013    Procedure: TRANSESOPHAGEAL ECHOCARDIOGRAM (TEE);  Surgeon: Pixie Casino, MD;  Location: [facility] ENDOSCOPY;  Service: Cardiovascular;  Laterality: N/A;  . Tee without cardioversion N/A 02/08/2015    Procedure: TRANSESOPHAGEAL ECHOCARDIOGRAM (TEE);  Surgeon: Skeet Latch, MD;  Location[facility] ENDOSCOPY;  Service: Cardiovascular;  Laterality: N/A;    Social History:  reports that she has never smoked. She has never used smokeless tobacco. She reports  that she does not drink alcohol or use illicit drugs.  Allergies:  Allergies  Allergen Reactions  . Azithromycin Other (See Comments)    Nose bleeding event    Medications Prior to Admission  Medication Sig Dispense Refill  . acetaminophen (TYLENOL) 500 MG tablet Take 1,000 mg by mouth every 6 (six) hours as needed for fever.    Marland Kitchen albuterol (PROAIR HFA) 108 (90 Base) MCG/ACT inhaler Inhale 2 puffs into the lungs every 6 (six) hours as needed for wheezing or shortness of breath.    Marland Kitchen albuterol (PROVENTIL) (2.5 MG/3ML) 0.083% nebulizer solution Take 2.5 mg by nebulization every 6 (six) hours as needed for wheezing or shortness of breath.    . carvedilol (COREG) 25 MG tablet Take 1 tablet (25 mg total) by mouth 2 (two) times daily with a meal. 60 tablet 1  . ferrous sulfate 325 (65 FE) MG tablet Take 1 tablet (325 mg total) by mouth 3 (three) times daily with meals. 90 tablet 3  . furosemide (LASIX) 40 MG tablet Take 3 tablets (120 mg total) by mouth 2 (two) times daily. 180 tablet 2  . insulin aspart (NOVOLOG) 100 UNIT/ML injection Inject 0-20 Units into the skin 3 (three) times daily with meals. CBG < 70: implement hypoglycemia protocol CBG 70 - 120: 0 units CBG 121 - 150: 3 units CBG 151 - 200: 4 units CBG 201 - 250: 7 units CBG 251 - 300: 11  units CBG 301 - 350: 15 units CBG 351 - 400: 20 units    . insulin glargine (LANTUS) 100 UNIT/ML injection Inject 0.15 mLs (15 Units total) into the skin daily. (Patient taking differently: Inject 15 Units into the skin at bedtime. ) 10 mL 11  . amLODipine (NORVASC) 10 MG tablet Take 1 tablet (10 mg total) by mouth daily. (Patient not taking: Reported on 02/19/2015) 30 tablet 2  . aspirin EC 325 MG EC tablet Take 1 tablet (325 mg total) by mouth daily. (Patient not taking: Reported on 02/19/2015) 30 tablet 0  . atorvastatin (LIPITOR) 20 MG tablet Take 1 tablet (20 mg total) by mouth daily at 6 PM. (Patient not taking: Reported on 02/19/2015)    .  gemfibrozil (LOPID) 600 MG tablet Take 1 tablet (600 mg total) by mouth 2 (two) times daily before a meal. (Patient not taking: Reported on 02/19/2015)      Blood pressure 174/88, pulse 80, temperature 98 F (36.7 C), temperature source Oral, resp. rate 24, height '5\' 5"'$  (1.651 m), weight 153.9 kg (339 lb 4.6 oz), SpO2 95 %. Physical Exam: General: morbidly obese black female who is laying in bed in NAD HEENT: head is normocephalic, atraumatic.  Sclera are noninjected.  PERRL. Disconjugate gaze  Ears and nose without any masses or lesions.  Mouth is pink and moist Skin: warm and dry with no masses, lesions, or rashes.  She has a large wound on her right posterior calf.  Please see admitting H&P for a picture.  This is mostly fibrin covering.  There is no drainage from the wound or evidence of abscess noted.  This does not appear to be infected. Psych: A&Ox3 with an appropriate affect.    Results for orders placed or performed during the hospital encounter of 02/19/15 (from the past 48 hour(s))  Basic metabolic panel     Status: Abnormal   Collection Time: 02/19/15 11:23 AM  Result Value Ref Range   Sodium 137 135 - 145 mmol/L   Potassium 3.5 3.5 - 5.1 mmol/L   Chloride 98 (L) 101 - 111 mmol/L   CO2 25 22 - 32 mmol/L   Glucose, Bld 189 (H) 65 - 99 mg/dL   BUN 58 (H) 6 - 20 mg/dL   Creatinine, Ser 5.84 (H) 0.44 - 1.00 mg/dL   Calcium 8.4 (L) 8.9 - 10.3 mg/dL   GFR calc non Af Amer 8 (L) >60 mL/min   GFR calc Af Amer 10 (L) >60 mL/min    Comment: (NOTE) The eGFR has been calculated using the CKD EPI equation. This calculation has not been validated in all clinical situations. eGFR's persistently <60 mL/min signify possible Chronic Kidney Disease.    Anion gap 14 5 - 15  CBC WITH DIFFERENTIAL     Status: Abnormal   Collection Time: 02/19/15 11:23 AM  Result Value Ref Range   WBC 19.8 (H) 4.0 - 10.5 K/uL   RBC 3.93 3.87 - 5.11 MIL/uL   Hemoglobin 10.4 (L) 12.0 - 15.0 g/dL   HCT 32.3  (L) 36.0 - 46.0 %   MCV 82.2 78.0 - 100.0 fL   MCH 26.5 26.0 - 34.0 pg   MCHC 32.2 30.0 - 36.0 g/dL   RDW 14.4 11.5 - 15.5 %   Platelets 172 150 - 400 K/uL   Neutrophils Relative % 77 %   Lymphocytes Relative 12 %   Monocytes Relative 11 %   Eosinophils Relative 0 %   Basophils Relative 0 %  Neutro Abs 15.2 (H) 1.7 - 7.7 K/uL   Lymphs Abs 2.4 0.7 - 4.0 K/uL   Monocytes Absolute 2.2 (H) 0.1 - 1.0 K/uL   Eosinophils Absolute 0.0 0.0 - 0.7 K/uL   Basophils Absolute 0.0 0.0 - 0.1 K/uL   RBC Morphology TARGET CELLS    WBC Morphology DOHLE BODIES   Brain natriuretic peptide     Status: Abnormal   Collection Time: 02/19/15 11:23 AM  Result Value Ref Range   B Natriuretic Peptide 921.9 (H) 0.0 - 100.0 pg/mL  I-Stat CG4 Lactic Acid, ED     Status: None   Collection Time: 02/19/15 11:32 AM  Result Value Ref Range   Lactic Acid, Venous 1.76 0.5 - 2.0 mmol/L  Culture, blood (routine x 2)     Status: None (Preliminary result)   Collection Time: 02/19/15 12:53 PM  Result Value Ref Range   Specimen Description BLOOD RIGHT FOREARM    Special Requests BOTTLES DRAWN AEROBIC AND ANAEROBIC    Culture NO GROWTH 1 DAY    Report Status PENDING   Culture, blood (routine x 2)     Status: None (Preliminary result)   Collection Time: 02/19/15  1:00 PM  Result Value Ref Range   Specimen Description BLOOD LEFT ANTECUBITAL    Special Requests BOTTLES DRAWN AEROBIC AND ANAEROBIC    Culture NO GROWTH 1 DAY    Report Status PENDING   I-Stat Arterial Blood Gas, ED ([facility], [facility])     Status: Abnormal   Collection Time: 02/19/15  1:49 PM  Result Value Ref Range   pH, Arterial 7.440 7.350 - 7.450   pCO2 arterial 37.3 35.0 - 45.0 mmHg   pO2, Arterial 73.0 (L) 80.0 - 100.0 mmHg   Bicarbonate 25.4 (H) 20.0 - 24.0 mEq/L   TCO2 26 0 - 100 mmol/L   O2 Saturation 95.0 %   Acid-Base Excess 1.0 0.0 - 2.0 mmol/L   Patient temperature HIDE    Sample type ARTERIAL   HIV antibody     Status: None    Collection Time: 02/19/15  3:06 PM  Result Value Ref Range   HIV Screen 4th Generation wRfx Non Reactive Non Reactive    Comment: (NOTE) Performed At: [facility] [address] [place], [place] [id number] Mila Homer MD ZW:[id number]   Glucose, capillary     Status: Abnormal   Collection Time: 02/19/15  6:52 PM  Result Value Ref Range   Glucose-Capillary 172 (H) 65 - 99 mg/dL   Comment 1 Capillary Specimen   Glucose, capillary     Status: Abnormal   Collection Time: 02/19/15  9:47 PM  Result Value Ref Range   Glucose-Capillary 152 (H) 65 - 99 mg/dL   Comment 1 Capillary Specimen   CBC     Status: Abnormal   Collection Time: 02/20/15  3:00 AM  Result Value Ref Range   WBC 21.0 (H) 4.0 - 10.5 K/uL   RBC 3.32 (L) 3.87 - 5.11 MIL/uL   Hemoglobin 8.8 (L) 12.0 - 15.0 g/dL   HCT 23.5 (L) 36.1 - 44.3 %   MCV 80.7 78.0 - 100.0 fL   MCH 26.5 26.0 - 34.0 pg   MCHC 32.8 30.0 - 36.0 g/dL   RDW 15.4 00.8 - 67.6 %   Platelets 191 150 - 400 K/uL    Comment: REPEATED TO VERIFY PLATELET COUNT CONFIRMED BY SMEAR   Renal function panel     Status: Abnormal   Collection Time: 02/20/15  3:00 AM  Result Value Ref Range   Sodium 138 135 - 145 mmol/L   Potassium 3.4 (L) 3.5 - 5.1 mmol/L   Chloride 99 (L) 101 - 111 mmol/L   CO2 23 22 - 32 mmol/L   Glucose, Bld 239 (H) 65 - 99 mg/dL   BUN 67 (H) 6 - 20 mg/dL   Creatinine, Ser 5.83 (H) 0.44 - 1.00 mg/dL   Calcium 8.2 (L) 8.9 - 10.3 mg/dL   Phosphorus 4.5 2.5 - 4.6 mg/dL   Albumin 2.0 (L) 3.5 - 5.0 g/dL   GFR calc non Af Amer 8 (L) >60 mL/min   GFR calc Af Amer 10 (L) >60 mL/min    Comment: (NOTE) The eGFR has been calculated using the CKD EPI equation. This calculation has not been validated in all clinical situations. eGFR's persistently <60 mL/min signify possible Chronic Kidney Disease.    Anion gap 16 (H) 5 - 15  Glucose, capillary     Status: Abnormal   Collection Time: 02/20/15  7:26 AM  Result Value Ref Range    Glucose-Capillary 223 (H) 65 - 99 mg/dL   Comment 1 Capillary Specimen   Glucose, capillary     Status: Abnormal   Collection Time: 02/20/15 11:41 AM  Result Value Ref Range   Glucose-Capillary 258 (H) 65 - 99 mg/dL   Comment 1 Capillary Specimen   C difficile quick scan w PCR reflex     Status: None   Collection Time: 02/20/15  2:14 PM  Result Value Ref Range   C Diff antigen NEGATIVE NEGATIVE   C Diff toxin NEGATIVE NEGATIVE   C Diff interpretation Negative for toxigenic C. difficile   Glucose, capillary     Status: Abnormal   Collection Time: 02/20/15  5:00 PM  Result Value Ref Range   Glucose-Capillary 250 (H) 65 - 99 mg/dL   Comment 1 Capillary Specimen   Glucose, capillary     Status: Abnormal   Collection Time: 02/20/15  9:24 PM  Result Value Ref Range   Glucose-Capillary 284 (H) 65 - 99 mg/dL  Basic metabolic panel     Status: Abnormal   Collection Time: 02/21/15  6:00 AM  Result Value Ref Range   Sodium 137 135 - 145 mmol/L   Potassium 2.9 (L) 3.5 - 5.1 mmol/L   Chloride 99 (L) 101 - 111 mmol/L   CO2 24 22 - 32 mmol/L   Glucose, Bld 384 (H) 65 - 99 mg/dL   BUN 82 (H) 6 - 20 mg/dL   Creatinine, Ser 5.96 (H) 0.44 - 1.00 mg/dL   Calcium 8.3 (L) 8.9 - 10.3 mg/dL   GFR calc non Af Amer 8 (L) >60 mL/min   GFR calc Af Amer 9 (L) >60 mL/min    Comment: (NOTE) The eGFR has been calculated using the CKD EPI equation. This calculation has not been validated in all clinical situations. eGFR's persistently <60 mL/min signify possible Chronic Kidney Disease.    Anion gap 14 5 - 15  CBC     Status: Abnormal   Collection Time: 02/21/15  6:00 AM  Result Value Ref Range   WBC 22.1 (H) 4.0 - 10.5 K/uL   RBC 3.65 (L) 3.87 - 5.11 MIL/uL   Hemoglobin 9.5 (L) 12.0 - 15.0 g/dL   HCT 29.2 (L) 36.0 - 46.0 %   MCV 80.0 78.0 - 100.0 fL   MCH 26.0 26.0 - 34.0 pg   MCHC 32.5 30.0 - 36.0 g/dL  RDW 14.4 11.5 - 15.5 %   Platelets 240 150 - 400 K/uL    Comment: PLATELET COUNT CONFIRMED  BY SMEAR LARGE PLATELETS PRESENT   Glucose, capillary     Status: Abnormal   Collection Time: 02/21/15  7:40 AM  Result Value Ref Range   Glucose-Capillary 346 (H) 65 - 99 mg/dL   Comment 1 Capillary Specimen    Dg Chest Port 1 View  02/20/2015  CLINICAL DATA:  Acute respiratory failure. EXAM: PORTABLE CHEST 1 VIEW COMPARISON:  02/19/2015. FINDINGS: Mediastinum and hilar structures normal. Cardiomegaly with pulmonary venous congestion and bilateral pulmonary infiltrates consistent congestive heart failure. Small left pleural effusion cannot be excluded. No pneumothorax. IMPRESSION: Progressive changes of congestive heart failure with bilateral pulmonary edema. Small left pleural effusion cannot be excluded. Electronically Signed   By: Marcello Moores  Register   On: 02/20/2015 07:48   Dg Chest Port 1 View  02/19/2015  CLINICAL DATA:  Weakness. EXAM: PORTABLE CHEST 1 VIEW COMPARISON:  02/04/2015 FINDINGS: There is moderate cardiac enlargement. Asymmetric elevation of right hemidiaphragm noted. No pleural effusion or edema identified. IMPRESSION: 1. Cardiac enlargement. Electronically Signed   By: Kerby Moors M.D.   On: 02/19/2015 11:32       Assessment/Plan 1. Right posterior calf wound -there is no overt evidence of infection or evidence to explain increasing WBC. -recommend local wound care, recommend WOC, RN consult for wound management. -no surgical debridement needed at this time. -please call as needed.  Laporchia Nakajima E 02/21/2015, 9:54 AM Pager: [phone]

## 2015-02-21 NOTE — Progress Notes (Signed)
CRITICAL VALUE ALERT  Critical value received:  Potassium 2.7  Date of notification:  02/21/2015  Time of notification:  1630  Critical value read back:Yes.     Nurse who received alert:  Ok AnisSanders,Tiesha Marich A, RN  MD notified (1st page):  Dr. Vanessa Barbarazamora   Time of first page:  1630  Responding MD:  Dr. Vanessa BarbaraZamora

## 2015-02-21 NOTE — Progress Notes (Signed)
[facility] Progress Note    Assessment/ Plan:   Background: 40 y.o. 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 Recently discharged after admission for dCHF exacerbation, creatinine increase from 2.98->3.74 with diuresis for CHF (lost 30lb) who returns now with febrile illness, cough, SOB, leg ulcer and worsening renal function - creatinine up to 5.84 and we are asked to see.   Impression/Plan  1. AKI on CKD4 - creatinine was still rising when she left the hospital 8 days ago so I don't know if this is acute or part of the insidious rise she was already experiencing as she was (very appropriately) diuresing. Sepsis could be playing a role here as well. She still has gross anasarca but her CXR is relatively clear - I wonder if she wasn't responding to the oral diuretics because of gut edema bec she has anasarca  - Restarted Lasix 80mg  twice daily IV on 1/9 and monitoring response.  Hopefully by improving her volume status her cardiac function / cardio- renal syndrome will improve as well. - She will ultimately benefit from dialysis to optimally control her volume - I'm not sure if we can do it with diuretics and still spare renal function; she was very much against being on dialysis but will continue to broach the subject and educate her. She is not far from dialysis and likely will need dialysis to manage the volume status. Her renal function likely will worsen with the diuretics that she needs. 2. Febrile illness - CXR neg for PNA. R calf likely the culprit.  3. Acute resp failure - Asthma exacerbation (cardiac asthma?), possible exacerbation dCHF but CXR clear, weight down another 3 lb since discharge on 12/30. Clearly has LOTS of extra volume on board - pulm responding to nebs, steroids but still with persistent cough. 4. Grade 1 DD on ECHO 02/05/15 - unclear how  much of her resp sx is asthma exac vs dCHF. See above discussion 1,2 5. Morbid obesity 6. DM2 7. HTN 8. Anemia. Hb>10. No ESA requirement as of yet 9. Reduced health literacy - this pt will require a LOT of education - very limited understanding  Subjective:   Feels breathing is about the same although she has a cough productive of only whitish sputum. She is not sure how much she is voiding but states sheets have been wet. Denies fever, chills, nausea.   Overall feels better.   Objective:   BP 180/82 mmHg  Pulse 80  Temp(Src) 98 F (36.7 C) (Oral)  Resp 24  Ht 5\' 5"  (1.651 m)  Wt 153.9 kg (339 lb 4.6 oz)  BMI 56.46 kg/m2  SpO2 96%  Intake/Output Summary (Last 24 hours) at 02/21/15 0843 Last data filed at 02/21/15 0500  Gross per 24 hour  Intake    560 ml  Output      0 ml  Net    560 ml   Weight change: -11.663 kg (-25 lb 11.4 oz)  Physical Exam: Gen: Morbidly obese AAF lying on stretcher - coughing (loose cough) and wheezing Skin: Numerous tattoos Neck: I cannot see her neck veins due to her large beefy neck Chest: Coarse crackles with exp wheezes Heart: regular, no rub or gallop Abdomen: soft, S1S2 No S3 or S4 Ext: 3+ pitting edema to the knees bilaterally.  Large denuded area R calf with redness and induration surrounding the area  of skin tear.  Neuro: alert, Ox3 GU: no foley  Imaging: Dg Chest Port 1 View  02/20/2015  CLINICAL DATA:  Acute respiratory failure. EXAM: PORTABLE CHEST 1 VIEW COMPARISON:  02/19/2015. FINDINGS: Mediastinum and hilar structures normal. Cardiomegaly with pulmonary venous congestion and bilateral pulmonary infiltrates consistent congestive heart failure. Small left pleural effusion cannot be excluded. No pneumothorax. IMPRESSION: Progressive changes of congestive heart failure with bilateral pulmonary edema. Small left pleural effusion cannot be excluded. Electronically Signed   By: Maisie Fushomas  Register   On: 02/20/2015 07:48   Dg Chest Port  1 View  02/19/2015  CLINICAL DATA:  Weakness. EXAM: PORTABLE CHEST 1 VIEW COMPARISON:  02/04/2015 FINDINGS: There is moderate cardiac enlargement. Asymmetric elevation of right hemidiaphragm noted. No pleural effusion or edema identified. IMPRESSION: 1. Cardiac enlargement. Electronically Signed   By: Signa Kellaylor  Stroud M.D.   On: 02/19/2015 11:32    Labs: BMET  Recent Labs Lab 02/19/15 1123 02/20/15 0300 02/21/15 0600  NA 137 138 137  K 3.5 3.4* 2.9*  CL 98* 99* 99*  CO2 25 23 24   GLUCOSE 189* 239* 384*  BUN 58* 67* 82*  CREATININE 5.84* 5.83* 5.96*  CALCIUM 8.4* 8.2* 8.3*  PHOS  --  4.5  --    CBC  Recent Labs Lab 02/19/15 1123 02/20/15 0300 02/21/15 0600  WBC 19.8* 21.0* 22.1*  NEUTROABS 15.2*  --   --   HGB 10.4* 8.8* 9.5*  HCT 32.3* 26.8* 29.2*  MCV 82.2 80.7 80.0  PLT 172 191 240    Medications:    . acetaminophen  1,000 mg Oral Once  . carvedilol  25 mg Oral BID WC  . furosemide  80 mg Intravenous BID  . heparin  5,000 Units Subcutaneous 3 times per day  . insulin aspart  0-15 Units Subcutaneous TID WC  . insulin aspart  0-5 Units Subcutaneous QHS  . insulin glargine  15 Units Subcutaneous QHS  . ipratropium-albuterol  3 mL Nebulization TID  . isosorbide mononitrate  30 mg Oral Daily  . methylPREDNISolone (SOLU-MEDROL) injection  60 mg Intravenous Q6H  . potassium chloride  40 mEq Oral Q6H  . sodium chloride  3 mL Intravenous Q12H  . sodium chloride  3 mL Intravenous Q12H  . vancomycin  2,000 mg Intravenous Q48H      Paulene FloorJames Chaelyn Bunyan, MD 02/21/2015, 8:43 AM

## 2015-02-21 NOTE — Consult Note (Signed)
WOC wound consult note Reason for Consult: Traumatic injury to right posterior lower leg at home with resulting cellulitis.Has no pain to this area.  Wound type: Traumatic wound, resulting in cellulitis Pressure Ulcer POA: N/A Measurement:6.6 cm x 4 cm x 0.3 cm  Wound bed: 50% pale pink nongranulating, 50% pink and moist Drainage (amount, consistency, odor) Moderate serous weeping.  Periwound:Erythema and edema to right lower leg.  Dressing procedure/placement/frequency: Cleanse wound to right lower leg with NS and pat gently dry.  Apply Aquacel AG silver hydrofiber to wound bed.  Cover with ABD pad and kerlix.  Change every other day.  Will not follow at this time.  Please re-consult if needed.  Maple HudsonKaren Chardae Mulkern RN BSN CWON Pager [phone]

## 2015-02-21 NOTE — Progress Notes (Signed)
Pt began having trouble speaking due to mouth dryness per family. Pt given water and speech improved. No issues at this time. MD notified. Pt has no stroke symptoms. VSS. Will continue to monitor. Jillyn HiddenStone,Enmanuel Zufall R, RN

## 2015-02-22 DIAGNOSIS — J9601 Acute respiratory failure with hypoxia: Secondary | ICD-10-CM

## 2015-02-22 DIAGNOSIS — J45901 Unspecified asthma with (acute) exacerbation: Secondary | ICD-10-CM

## 2015-02-22 DIAGNOSIS — I10 Essential (primary) hypertension: Secondary | ICD-10-CM

## 2015-02-22 DIAGNOSIS — I5031 Acute diastolic (congestive) heart failure: Secondary | ICD-10-CM

## 2015-02-22 DIAGNOSIS — D72829 Elevated white blood cell count, unspecified: Secondary | ICD-10-CM

## 2015-02-22 LAB — BASIC METABOLIC PANEL
Anion gap: 16 — ABNORMAL HIGH (ref 5–15)
Anion gap: 15 (ref 5–15)
BUN: 87 mg/dL — AB (ref 6–20)
BUN: 91 mg/dL — AB (ref 6–20)
CHLORIDE: 99 mmol/L — AB (ref 101–111)
CO2: 21 mmol/L — AB (ref 22–32)
CO2: 20 mmol/L — ABNORMAL LOW (ref 22–32)
CREATININE: 5.44 mg/dL — AB (ref 0.44–1.00)
CREATININE: 5.35 mg/dL — AB (ref 0.44–1.00)
Calcium: 8.7 mg/dL — ABNORMAL LOW (ref 8.9–10.3)
Calcium: 8.7 mg/dL — ABNORMAL LOW (ref 8.9–10.3)
Chloride: 99 mmol/L — ABNORMAL LOW (ref 101–111)
GFR calc Af Amer: 11 mL/min — ABNORMAL LOW (ref >60)
GFR calc non Af Amer: 9 mL/min — ABNORMAL LOW (ref >60)
GFR, EST AFRICAN AMERICAN: 11 mL/min — AB (ref >60)
GFR, EST NON AFRICAN AMERICAN: 9 mL/min — AB (ref >60)
GLUCOSE: 322 mg/dL — AB (ref 65–99)
Glucose, Bld: 353 mg/dL — ABNORMAL HIGH (ref 65–99)
Potassium: 3.3 mmol/L — ABNORMAL LOW (ref 3.5–5.1)
Potassium: 3.5 mmol/L (ref 3.5–5.1)
SODIUM: 134 mmol/L — AB (ref 135–145)
Sodium: 136 mmol/L (ref 135–145)

## 2015-02-22 LAB — GLUCOSE, CAPILLARY
GLUCOSE-CAPILLARY: 322 mg/dL — AB (ref 65–99)
GLUCOSE-CAPILLARY: 278 mg/dL — AB (ref 65–99)
GLUCOSE-CAPILLARY: 346 mg/dL — AB (ref 65–99)
Glucose-Capillary: 275 mg/dL — ABNORMAL HIGH (ref 65–99)

## 2015-02-22 LAB — URINE MICROSCOPIC-ADD ON

## 2015-02-22 LAB — URINALYSIS, ROUTINE W REFLEX MICROSCOPIC
Bilirubin Urine: NEGATIVE
GLUCOSE, UA: 100 mg/dL — AB
KETONES UR: NEGATIVE mg/dL
Nitrite: NEGATIVE
PH: 5 (ref 5.0–8.0)
Protein, ur: >300 mg/dL — AB
Specific Gravity, Urine: 1.016 (ref 1.005–1.030)

## 2015-02-22 LAB — CBC
HCT: 28.7 % — ABNORMAL LOW (ref 36.0–46.0)
Hemoglobin: 9.5 g/dL — ABNORMAL LOW (ref 12.0–15.0)
MCH: 26.3 pg (ref 26.0–34.0)
MCHC: 33.1 g/dL (ref 30.0–36.0)
MCV: 79.5 fL (ref 78.0–100.0)
PLATELETS: 302 10*3/uL (ref 150–400)
RBC: 3.61 MIL/uL — AB (ref 3.87–5.11)
RDW: 14.2 % (ref 11.5–15.5)
WBC: 18.3 10*3/uL — ABNORMAL HIGH (ref 4.0–10.5)

## 2015-02-22 MED ORDER — POTASSIUM CHLORIDE CRYS ER 20 MEQ PO TBCR
40.0000 meq | EXTENDED_RELEASE_TABLET | Freq: Every day | ORAL | Status: DC
  Administered 2015-02-23 – 2015-02-27 (×5): 40 meq via ORAL
  Filled 2015-02-22 (×5): qty 2

## 2015-02-22 MED ORDER — DEXTROSE 5 % IV SOLN: 2.0000 g | INTRAVENOUS | Status: DC

## 2015-02-22 MED ORDER — DEXTROSE 5 % IV SOLN
1.0000 g | INTRAVENOUS | Status: DC
  Filled 2015-02-22: qty 10

## 2015-02-22 MED ORDER — AMLODIPINE BESYLATE 10 MG PO TABS
10.0000 mg | ORAL_TABLET | Freq: Every day | ORAL | Status: DC
  Administered 2015-02-22 – 2015-02-27 (×6): 10 mg via ORAL
  Filled 2015-02-22 (×6): qty 1

## 2015-02-22 MED ORDER — METHYLPREDNISOLONE SODIUM SUCC 125 MG IJ SOLR
60.0000 mg | Freq: Three times a day (TID) | INTRAMUSCULAR | Status: DC
Start: 2015-02-22 — End: 2015-02-24
  Administered 2015-02-22 – 2015-02-24 (×5): 60 mg via INTRAVENOUS
  Filled 2015-02-22 (×5): qty 2

## 2015-02-22 MED ORDER — INSULIN ASPART 100 UNIT/ML ~~LOC~~ SOLN
6.0000 [IU] | Freq: Three times a day (TID) | SUBCUTANEOUS | Status: DC
  Administered 2015-02-22 – 2015-02-27 (×15): 6 [IU] via SUBCUTANEOUS

## 2015-02-22 MED ORDER — POTASSIUM CHLORIDE 10 MEQ/100ML IV SOLN
INTRAVENOUS | Status: AC
  Filled 2015-02-22: qty 100

## 2015-02-22 MED ORDER — INSULIN GLARGINE 100 UNIT/ML ~~LOC~~ SOLN
25.0000 [IU] | Freq: Every day | SUBCUTANEOUS | Status: DC
  Administered 2015-02-22: 25 [IU] via SUBCUTANEOUS
  Filled 2015-02-22 (×2): qty 0.25

## 2015-02-22 MED ORDER — POTASSIUM CHLORIDE CRYS ER 20 MEQ PO TBCR
40.0000 meq | EXTENDED_RELEASE_TABLET | Freq: Once | ORAL | Status: AC
  Administered 2015-02-22: 40 meq via ORAL
  Filled 2015-02-22: qty 2

## 2015-02-22 MED ORDER — DEXTROSE 5 % IV SOLN
2.0000 g | INTRAVENOUS | Status: DC
  Administered 2015-02-22: 2 g via INTRAVENOUS
  Filled 2015-02-22 (×2): qty 2

## 2015-02-22 NOTE — Progress Notes (Signed)
Pharmacy Antibiotic Follow-up Note  [name] is a 40 y.o. year-old female admitted on 02/19/2015.  The patient is currently on day 4 of Vancomycin for cellulitis.  Assessment/Plan: This patient's current antibiotics will be continued without adjustments.  Continue to follow  Temp (24hrs), Avg:98.3 F (36.8 C), Min:98 F (36.7 C), Max:98.7 F (37.1 C)   Recent Labs Lab 02/19/15 1123 02/20/15 0300 02/21/15 0600 02/22/15 0458  WBC 19.8* 21.0* 22.1* 18.3*    Recent Labs Lab 02/19/15 1123 02/20/15 0300 02/21/15 0600 02/21/15 1630 02/22/15 0458  CREATININE 5.84* 5.83* 5.96* 5.73* 5.44*   Estimated Creatinine Clearance: 21.2 mL/min (by C-G formula based on Cr of 5.44).    Allergies  Allergen Reactions  . Azithromycin Other (See Comments)    Nose bleeding event    Antimicrobials this admission: 1/8 Vanc>>  1/8 BCx2>>ngtd 1/9 cdiff neg  Thank you for allowing pharmacy to be a part of this patient's care. [name], PharmD [phone]  02/22/2015 10:22 AM

## 2015-02-22 NOTE — Progress Notes (Signed)
02/22/2015  11:19 AM  Patient is a fall risk and is obese.  So far this am, I have entered the room and the husband and sister have gotten her up without calling.  Myself as well as the NT have stressed the importance of calling for assistance to prevent falls, however, the husband states he does it at home and they are just fine.  I again reiterated to them that this is a different environment and she is weaker, and that our main goal is her safety. They verbalized understanding, however, still insist on providing care per their usual at home.  The NT and myself [facility], however, the husband and sister got her back to bed without calling for assistance.  Reiterated to please call.  Will monitor. Theadora RamaKIRKMAN, Mycheal [place]oke

## 2015-02-22 NOTE — Progress Notes (Signed)
02/22/2015 12:39 PM  Spoke to patient regarding care concerns and assisted in service recovery.  PACCAR Incyanne Hill BSN, RN-BC, [employer] [facility] Phone [phone]

## 2015-02-22 NOTE — Progress Notes (Signed)
Inpatient Diabetes Program Recommendations  AACE/ADA: New Consensus Statement on Inpatient Glycemic Control (2015)  Target Ranges:  Prepandial:   less than 140 mg/dL      Peak postprandial:   less than 180 mg/dL (1-2 hours)      Critically ill patients:  140 - 180 mg/dL    Results for Neva SeatMCKINNONALSTON, Kabria (MRN [id number]) as of 02/22/2015 14:11  Ref. Range 02/21/2015 07:40 02/21/2015 11:41 02/21/2015 16:27 02/21/2015 21:21  Glucose-Capillary Latest Ref Range: 65-99 mg/dL 782346 (H) 956371 (H) 213312 (H) 319 (H)    Results for Neva SeatMCKINNONALSTON, Meeyah (MRN [id number]) as of 02/22/2015 14:11  Ref. Range 02/22/2015 07:55 02/22/2015 12:30  Glucose-Capillary Latest Ref Range: 65-99 mg/dL 629275 (H) 528322 (H)    Home DM Meds: Lantus 15 units QHS       Novolog SSI  Current Insulin Orders: Lantus 20 units QHS      Novolog Resistant SSI (0-20 units) TID AC + HS      -Patient currently getting IV Solumedrol 60 mg Q6 hours.  -Having sustained elevated glucose levels.    MD- Please consider the following in-hospital insulin adjustments while patient getting IV steroids:  1. Increase Lantus to 25 units QHS  2. Start Novolog Meal Coverage- Novolog 6 units tidwc     --Will follow patient during hospitalization--  Ambrose FinlandJeannine Johnston Marelin Tat RN, MSN, CDE Diabetes Coordinator Inpatient Glycemic Control Team Team Pager: [phone] (8a-5p)

## 2015-02-22 NOTE — Progress Notes (Signed)
[facility] Progress Note    Assessment/ Plan:   Background: 40 y.o. 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 Recently discharged after admission for dCHF exacerbation, creatinine increase from 2.98->3.74 with diuresis for CHF (lost 30lb) who returns now with febrile illness, cough, SOB, leg ulcer and worsening renal function - creatinine up to 5.84 and we are asked to see.   Impression/Plan  1. AKI on CKD4 - creatinine was still rising when she left the hospital 8 days ago so I don't know if this is acute or part of the insidious rise she was already experiencing as she was (very appropriately) diuresing. Sepsis could be playing a role here as well. She still has gross anasarca but her CXR is relatively clear - I wonder if she wasn't responding to the oral diuretics because of gut edema bec she has anasarca  - Restarted Lasix 80mg  twice daily IV on 1/9 and monitoring response (continue for now). Hopefully by improving her volume status her cardiac function / cardio- renal syndrome will improve as well.  - She will ultimately benefit from dialysis to optimally control her volume - I'm not sure if we can do it with diuretics and still spare renal function; she was very much against being on dialysis but will continue to broach the subject and educate her. She is not far from dialysis and likely will need dialysis to manage the volume status. Her renal function likely will worsen with the diuretics that she needs. - Will start KlorCon 40meq daily while she's on diuretics (likely lifelong) 2. Febrile illness - CXR neg for PNA. R calf likely the culprit.  3. Acute resp failure - Asthma exacerbation (cardiac asthma?), possible exacerbation dCHF but CXR clear, weight down another 3 lb since discharge on 12/30. Clearly has LOTS of extra volume on board - pulm  responding to nebs, steroids but still with persistent cough. 4. Grade 1 DD on ECHO 02/05/15 - unclear how much of her resp sx is asthma exac vs dCHF. See above discussion 1,2 5. Morbid obesity 6. DM2 7. HTN 8. Anemia. Hb>10. No ESA requirement as of yet 9. Reduced health literacy - this pt will require a LOT of education - very limited understanding  Subjective:   Feels breathing is about the same although she has a cough productive of only whitish sputum. She is not sure how much she is voiding but states sheets have been wet. Denies fever, chills, nausea.   Overall feels better; more frequent bowel movements.     Objective:   BP 171/100 mmHg  Pulse 74  Temp(Src) 98.7 F (37.1 C) (Oral)  Resp 20  Ht 5\' 5"  (1.651 m)  Wt 154 kg (339 lb 8.1 oz)  BMI 56.50 kg/m2  SpO2 95%  Intake/Output Summary (Last 24 hours) at 02/22/15 1145 Last data filed at 02/22/15 1042  Gross per 24 hour  Intake    120 ml  Output    400 ml  Net   -280 ml   Weight change: 0.1 kg (3.5 oz)  Physical Exam: Gen: Morbidly obese AAF lying on stretcher - coughing (loose cough) and wheezing Skin: Numerous tattoos Neck: I cannot see her neck veins due to her large beefy neck Chest: Coarse crackles with exp wheezes Heart: regular, no rub or gallop Abdomen: soft, S1S2 No S3 or S4 Ext: 2-3++ pitting edema to  the knees bilaterally.  Large denuded area R calf with redness and induration surrounding the area of skin tear.  Neuro: alert, Ox3 GU: no foley  Imaging: No results found.  Labs: BMET  Recent Labs Lab 02/19/15 1123 02/20/15 0300 02/21/15 0600 02/21/15 1630 02/22/15 0458  NA 137 138 137 135 136  K 3.5 3.4* 2.9* 2.7* 3.3*  CL 98* 99* 99* 98* 99*  CO2 25 23 24 24  21*  GLUCOSE 189* 239* 384* 366* 322*  BUN 58* 67* 82* 81* 87*  CREATININE 5.84* 5.83* 5.96* 5.73* 5.44*  CALCIUM 8.4* 8.2* 8.3* 8.8* 8.7*  PHOS  --  4.5  --   --   --    CBC  Recent Labs Lab 02/19/15 1123  02/20/15 0300 02/21/15 0600 02/22/15 0458  WBC 19.8* 21.0* 22.1* 18.3*  NEUTROABS 15.2*  --   --   --   HGB 10.4* 8.8* 9.5* 9.5*  HCT 32.3* 26.8* 29.2* 28.7*  MCV 82.2 80.7 80.0 79.5  PLT 172 191 240 302    Medications:    . acetaminophen  1,000 mg Oral Once  . amLODipine  10 mg Oral Daily  . carvedilol  25 mg Oral BID WC  . furosemide  80 mg Intravenous BID  . heparin  5,000 Units Subcutaneous 3 times per day  . insulin aspart  0-20 Units Subcutaneous TID WC  . insulin aspart  0-5 Units Subcutaneous QHS  . insulin glargine  20 Units Subcutaneous QHS  . ipratropium-albuterol  3 mL Nebulization TID  . isosorbide mononitrate  30 mg Oral Daily  . methylPREDNISolone (SOLU-MEDROL) injection  60 mg Intravenous Q6H  . potassium chloride      . sodium chloride  3 mL Intravenous Q12H  . vancomycin  2,000 mg Intravenous Q48H      Paulene Floor, MD 02/22/2015, 11:45 AM

## 2015-02-22 NOTE — Progress Notes (Signed)
[facility] PROGRESS NOTE  [name] RUE:[id number] DOB: [birth year] DOA: 02/19/2015 PCP: Linda Blood, MD  Brief interval history [name] is a 40 year old female with a past medical history of morbid obesity having multiple comorbidities including congestive heart failure, hypertension, diabetes mellitus, stage IV chronic kidney disease presented to the emergency department with complaints of generalized weakness, fatigue, minimal by mouth intake and having overall functional decline.Marland Kitchen She was recently discharged from the medicine service on 02/10/2015 at which time she was treated for acute on chronic diastolic congestive heart failure and acute on chronic renal failure. Lab work performed in the emergency department showed upward trend in her creatinine from 3.74 on 02/10/2015 to 5.84 on presentation. She was seen and evaluated by nephrology. Lasix was held. She was also found to have a skin tear in the right calf region.   Assessment/Plan: 1. Acute on chronic renal failure -Patient having a history of stage IV chronic kidney disease, recently hospitalized for acute on chronic renal failure, on 02/10/2015 had a creatinine of 3.74 with BUN of 30. She presents with generalized weakness and fatigue, with labs showing creatinine of 5.84 with BUN of 58. Patient likely worsening renal failure and may need hemodialysis. -She was seen and evaluated by nephrology recommending holding Lasix therapy, which she had been taking 120 mg twice daily. Patient has been started on IV Lasix per nephrology now at 80 every 12 hours as patient may have not responded to oral Lasix secondary to gut edema. Monitor output. Follow renal function. -On recent hospitalization had extensive workup for renal failure which was unrevealing. -I suspect hemodialysis is a future intervention although it appears patient agreeable to this at the time being. -Repeat labs on 02/21/2015 showing upward trend in  creatinine 5.83>5.96. Her lasix was restarted on 02/20/2015 at 80 mg IV BID and creatinine today is 5.35. Per nephrology.   2. Right calf cellulitis -She was found to have a 8 cm x 4 cm skin tear in the right calf with some associated erythema. Labs revealed a white count of 19,800 having a temperature of 101.5 raising concern for infection -She was started on empiric IV antibiotic therapy with vancomycin  -White count continues to rise 19.8>21>22.1, though has been afebrile for the past 24 hours. WBC trending back down likely a component of steroids. Patient has been seen by general surgery who feel no surgical intervention is needed at this time. Continue current wound care dressing changes.   3. Acute respiratory failure -Evidenced by a respiratory rate of 25 having an O2 sat of 89% on 2 L supplemental oxygen. -Likely multifactorial secondary to asthma exacerbation and acute diastolic heart failure and worsening renal function and obesity hypoventilation syndrome. Patient with some clinical improvement. Continue IV Lasix, IV steroid taper, nebulizer treatments.  4. History of diastolic congestive heart failure -Last transthoracic echocardiogram was performed on 02/05/2015 that showed a preserved ejection fraction with grade 1 diastolic dysfunction. -As mentioned above due to acute on chronic renal failure her Lasix will be held. She was previously taking 120 mg by mouth twice a day. -Lasix was restarted on 02/20/2015 at 80 mg IV BID by nephrology. Watching kidney function. Suspect will likely need HD.  -Will closely monitor volume status.  5. Insulin-dependent diabetes mellitus. -Hyperglycemia likely secondary to steroids -Increasing Lantus 25 units Forbes q daily and continue resistant SSI.   6. Hypertension. -[name] pressures remain elevated with systolic [name] pressures in the 160s to 180s -Continue Coreg 25 mg by mouth twice a  day, IV Lasix,IMDUR. Will resume home dose Norvasc.  7.  Hypokalemia -Replete.   8.  Escherichia coli UTI IV Rocephin.    Code Status: Full Family Communication: Updated patient and family at bedside. Disposition Plan: Home once volume overload has been corrected and renal function has improved.   Consultants:  Nephrology: Dr. Eliott [name] 02/19/2015  Gen. surgery Dr. Derrell [name] 02/21/2015  Procedures:  Chest x-ray 02/19/2015, 02/20/2015  Antibiotics:  IV Rocephin 02/22/2015  IV vancomycin 02/19/2015  HPI/Subjective: Patient states shortness of breath has improved. Patient denies any chest pain.  Objective: Filed Vitals:   02/22/15 1042 02/22/15 1450  BP: 171/100   Pulse: 74 76  Temp: 98.7 F (37.1 C)   Resp: 20 18    Intake/Output Summary (Last 24 hours) at 02/22/15 1514 Last data filed at 02/22/15 1042  Gross per 24 hour  Intake      0 ml  Output    400 ml  Net   -400 ml   Filed Weights   02/20/15 1400 02/21/15 0500 02/21/15 2146  Weight: 153.9 kg (339 lb 4.6 oz) 153.9 kg (339 lb 4.6 oz) 154 kg (339 lb 8.1 oz)    Exam:   General:  NAD  Cardiovascular: RRR. Distant heart sounds secondary to body habitus.  Respiratory: Some scattered coarse crackles.  Abdomen: Obese, soft, nontender, nondistended, positive bowel sounds.  Musculoskeletal: No clubbing cyanosis. 3+ bilateral lower extremity edema. Large denuded area right calf with redness and induration.  Data Reviewed: Basic Metabolic Panel:  Recent Labs Lab 02/20/15 0300 02/21/15 0600 02/21/15 1630 02/22/15 0458 02/22/15 1235  NA 138 137 135 136 134*  K 3.4* 2.9* 2.7* 3.3* 3.5  CL 99* 99* 98* 99* 99*  CO2 23 24 24  21* 20*  GLUCOSE 239* 384* 366* 322* 353*  BUN 67* 82* 81* 87* 91*  CREATININE 5.83* 5.96* 5.73* 5.44* 5.35*  CALCIUM 8.2* 8.3* 8.8* 8.7* 8.7*  PHOS 4.5  --   --   --   --    Liver Function Tests:  Recent Labs Lab 02/20/15 0300  ALBUMIN 2.0*   No results for input(s): LIPASE, AMYLASE in the last 168 hours. No results for  input(s): AMMONIA in the last 168 hours. CBC:  Recent Labs Lab 02/19/15 1123 02/20/15 0300 02/21/15 0600 02/22/15 0458  WBC 19.8* 21.0* 22.1* 18.3*  NEUTROABS 15.2*  --   --   --   HGB 10.4* 8.8* 9.5* 9.5*  HCT 32.3* 26.8* 29.2* 28.7*  MCV 82.2 80.7 80.0 79.5  PLT 172 191 240 302   Cardiac Enzymes: No results for input(s): CKTOTAL, CKMB, CKMBINDEX, TROPONINI in the last 168 hours. BNP (last 3 results)  Recent Labs  02/04/15 0031 02/19/15 1123  BNP 349.2* 921.9*    ProBNP (last 3 results) No results for input(s): PROBNP in the last 8760 hours.  CBG:  Recent Labs Lab 02/21/15 1141 02/21/15 1627 02/21/15 2121 02/22/15 0755 02/22/15 1230  GLUCAP 371* 312* 319* 275* 322*    Recent Results (from the past 240 hour(s))  Culture, [name] (routine x 2)     Status: None (Preliminary result)   Collection Time: 02/19/15 12:53 PM  Result Value Ref Range Status   Specimen Description [name] RIGHT FOREARM  Final   Special Requests BOTTLES DRAWN AEROBIC AND ANAEROBIC  Final   Culture NO GROWTH 3 DAYS  Final   Report Status PENDING  Incomplete  Culture, [name] (routine x 2)     Status: None (Preliminary result)  Collection Time: 02/19/15  1:00 PM  Result Value Ref Range Status   Specimen Description [name] LEFT ANTECUBITAL  Final   Special Requests BOTTLES DRAWN AEROBIC AND ANAEROBIC 10MLS  Final   Culture NO GROWTH 3 DAYS  Final   Report Status PENDING  Incomplete  C difficile quick scan w PCR reflex     Status: None   Collection Time: 02/20/15  2:14 PM  Result Value Ref Range Status   C Diff antigen NEGATIVE NEGATIVE Final   C Diff toxin NEGATIVE NEGATIVE Final   C Diff interpretation Negative for toxigenic C. difficile  Final     Studies: No results found.  Scheduled Meds: . acetaminophen  1,000 mg Oral Once  . amLODipine  10 mg Oral Daily  . carvedilol  25 mg Oral BID WC  . cefTRIAXone (ROCEPHIN)  IV  2 g Intravenous Q24H  . furosemide  80 mg Intravenous  BID  . heparin  5,000 Units Subcutaneous 3 times per day  . insulin aspart  0-20 Units Subcutaneous TID WC  . insulin aspart  0-5 Units Subcutaneous QHS  . insulin aspart  6 Units Subcutaneous TID WC  . insulin glargine  25 Units Subcutaneous QHS  . ipratropium-albuterol  3 mL Nebulization TID  . isosorbide mononitrate  30 mg Oral Daily  . methylPREDNISolone (SOLU-MEDROL) injection  60 mg Intravenous Q6H  . [START ON 02/23/2015] potassium chloride  40 mEq Oral Daily  . sodium chloride  3 mL Intravenous Q12H  . vancomycin  2,000 mg Intravenous Q48H   Continuous Infusions:   Principal Problem:   Acute respiratory failure with hypoxia (HCC) Active Problems:   Diabetes mellitus with hyperglycemia (HCC)   AKI (acute kidney injury) (HCC)   Chronic kidney disease (CKD), stage IV (severe) (HCC)   Cellulitis   History of stroke   Asthma exacerbation   Acute diastolic (congestive) heart failure (HCC)    Time spent: 35 minutes    M[facility] Amb[facility] SurgicenterHOMPSON,Flem Enderle MD [facility] Pager [phone]. If 7PM-7AM, please contact night-coverage at [email or web address], password [email or web address] 02/22/2015, 3:14 PM  LOS: 3 days

## 2015-02-23 DIAGNOSIS — B9620 Unspecified Escherichia coli [E. coli] as the cause of diseases classified elsewhere: Secondary | ICD-10-CM

## 2015-02-23 DIAGNOSIS — N390 Urinary tract infection, site not specified: Secondary | ICD-10-CM

## 2015-02-23 LAB — CBC WITH DIFFERENTIAL/PLATELET
BASOS ABS: 0 10*3/uL (ref 0.0–0.1)
Basophils Relative: 0 %
Eosinophils Absolute: 0 10*3/uL (ref 0.0–0.7)
Eosinophils Relative: 0 %
HCT: 30 % — ABNORMAL LOW (ref 36.0–46.0)
Hemoglobin: 9.9 g/dL — ABNORMAL LOW (ref 12.0–15.0)
LYMPHS ABS: 1 10*3/uL (ref 0.7–4.0)
LYMPHS PCT: 7 %
MCH: 26.6 pg (ref 26.0–34.0)
MCHC: 33 g/dL (ref 30.0–36.0)
MCV: 80.6 fL (ref 78.0–100.0)
MONOS PCT: 2 %
Monocytes Absolute: 0.3 10*3/uL (ref 0.1–1.0)
Neutro Abs: 13.5 10*3/uL — ABNORMAL HIGH (ref 1.7–7.7)
Neutrophils Relative %: 91 %
PLATELETS: 340 10*3/uL (ref 150–400)
RBC: 3.72 MIL/uL — AB (ref 3.87–5.11)
RDW: 14.2 % (ref 11.5–15.5)
WBC: 14.8 10*3/uL — AB (ref 4.0–10.5)

## 2015-02-23 LAB — BASIC METABOLIC PANEL
ANION GAP: 12 (ref 5–15)
BUN: 96 mg/dL — AB (ref 6–20)
CO2: 23 mmol/L (ref 22–32)
Calcium: 8.7 mg/dL — ABNORMAL LOW (ref 8.9–10.3)
Chloride: 100 mmol/L — ABNORMAL LOW (ref 101–111)
Creatinine, Ser: 5.17 mg/dL — ABNORMAL HIGH (ref 0.44–1.00)
GFR, EST AFRICAN AMERICAN: 11 mL/min — AB (ref >60)
GFR, EST NON AFRICAN AMERICAN: 10 mL/min — AB (ref >60)
Glucose, Bld: 410 mg/dL — ABNORMAL HIGH (ref 65–99)
POTASSIUM: 3.6 mmol/L (ref 3.5–5.1)
SODIUM: 135 mmol/L (ref 135–145)

## 2015-02-23 LAB — GLUCOSE, CAPILLARY
GLUCOSE-CAPILLARY: 389 mg/dL — AB (ref 65–99)
GLUCOSE-CAPILLARY: 305 mg/dL — AB (ref 65–99)
Glucose-Capillary: 334 mg/dL — ABNORMAL HIGH (ref 65–99)

## 2015-02-23 LAB — PATHOLOGIST SMEAR REVIEW

## 2015-02-23 MED ORDER — INSULIN GLARGINE 100 UNIT/ML ~~LOC~~ SOLN
30.0000 [IU] | Freq: Every day | SUBCUTANEOUS | Status: DC
  Administered 2015-02-23 – 2015-02-26 (×4): 30 [IU] via SUBCUTANEOUS
  Filled 2015-02-23 (×5): qty 0.3

## 2015-02-23 MED ORDER — DEXTROSE 5 % IV SOLN
1.0000 g | INTRAVENOUS | Status: DC
  Administered 2015-02-23: 1 g via INTRAVENOUS
  Filled 2015-02-23 (×2): qty 10

## 2015-02-23 NOTE — Progress Notes (Signed)
[facility] Progress Note    Assessment/ Plan:   Background: 40 y.o. 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 Recently discharged after admission for dCHF exacerbation, creatinine increase from 2.98->3.74 with diuresis for CHF (lost 30lb) who returns now with febrile illness, cough, SOB, leg ulcer and worsening renal function - creatinine up to 5.84 and we are asked to see.    1. AKI on CKD4 - creatinine was still rising when she left the hospital 8 days ago so I don't know if this is acute or part of the insidious rise she was already experiencing as she was (very appropriately) diuresing. Sepsis could be playing a role here as well. She still has gross anasarca but her CXR is relatively clear - I wonder if she wasn't responding to the oral diuretics because of gut edema bec she has anasarca  - Restarted Lasix 80mg  twice daily IV on 1/9 and monitoring response (continue for now). Hopefully by improving her volume status her cardiac function / cardio- renal syndrome will improve as well.  - She will ultimately benefit from dialysis to optimally control her volume - I'm not sure if we can do it with diuretics and still spare renal function; she was very much against being on dialysis but will continue to broach the subject and educate her. She is not far from dialysis and likely will need dialysis to manage the volume status. Her renal function likely will worsen with the diuretics that she needs. - Start KlorCon daily while she's on diuretics (likely lifelong)  - Fortunately renal function appears to have stabilized with a decrease over the past 48hrs --> will continue to monitor.  2. Febrile illness - CXR neg for PNA. R calf likely the culprit.  3. Acute resp failure - Asthma exacerbation (cardiac asthma?), possible exacerbation dCHF but CXR clear,  weight down another 3 lb since discharge on 12/30. Clearly has LOTS of extra volume on board - pulm responding to nebs, steroids but still with persistent cough. 4. Grade 1 DD on ECHO 02/05/15 - unclear how much of her resp sx is asthma exac vs dCHF. See above discussion 1,2 5. Morbid obesity 6. DM2 7. HTN 8. Anemia. Hb>10. No ESA requirement as of yet 9. Reduced health literacy - this pt will require a LOT of education - very limited understanding  Subjective:   Feels breathing is about the same although she has a cough productive of only whitish sputum. She is not sure how much she is voiding but states sheets have been wet. Denies fever, chills, nausea.   Overall feels better; more frequent bowel movements.  Husband and daughter bedside.    Objective:   BP 167/79 mmHg  Pulse 74  Temp(Src) 98.2 F (36.8 C) (Oral)  Resp 18  Ht 5\' 5"  (1.651 m)  Wt 188.243 kg (415 lb)  BMI 69.06 kg/m2  SpO2 98%  Intake/Output Summary (Last 24 hours) at 02/23/15 1512 Last data filed at 02/23/15 1454  Gross per 24 hour  Intake    890 ml  Output      0 ml  Net    890 ml   Weight change: 34.243 kg (75 lb 7.9 oz)  Physical Exam: Gen: Morbidly obese AAF lying on stretcher - coughing (loose cough) and wheezing Skin: Numerous tattoos Neck: I cannot see her neck veins due to her  large beefy neck Chest: Coarse crackles with exp wheezes Heart: regular, no rub or gallop Abdomen: soft, S1S2 No S3 or S4 Ext: 2-3++ pitting edema to the knees bilaterally.  Large denuded area R calf with redness and induration surrounding the area of skin tear.  Neuro: alert, Ox3 GU: no foley  Imaging: No results found.  Labs: BMET  Recent Labs Lab 02/19/15 1123 02/20/15 0300 02/21/15 0600 02/21/15 1630 02/22/15 0458 02/22/15 1235 02/23/15 0625  NA 137 138 137 135 136 134* 135  K 3.5 3.4* 2.9* 2.7* 3.3* 3.5 3.6  CL 98* 99* 99* 98* 99* 99* 100*  CO2 25 23 24 24  21* 20* 23  GLUCOSE 189* 239* 384*  366* 322* 353* 410*  BUN 58* 67* 82* 81* 87* 91* 96*  CREATININE 5.84* 5.83* 5.96* 5.73* 5.44* 5.35* 5.17*  CALCIUM 8.4* 8.2* 8.3* 8.8* 8.7* 8.7* 8.7*  PHOS  --  4.5  --   --   --   --   --    CBC  Recent Labs Lab 02/19/15 1123 02/20/15 0300 02/21/15 0600 02/22/15 0458 02/23/15 0625  WBC 19.8* 21.0* 22.1* 18.3* 14.8*  NEUTROABS 15.2*  --   --   --  13.5*  HGB 10.4* 8.8* 9.5* 9.5* 9.9*  HCT 32.3* 26.8* 29.2* 28.7* 30.0*  MCV 82.2 80.7 80.0 79.5 80.6  PLT 172 191 240 302 340    Medications:    . acetaminophen  1,000 mg Oral Once  . amLODipine  10 mg Oral Daily  . carvedilol  25 mg Oral BID WC  . cefTRIAXone (ROCEPHIN)  IV  1 g Intravenous Q24H  . furosemide  80 mg Intravenous BID  . heparin  5,000 Units Subcutaneous 3 times per day  . insulin aspart  0-20 Units Subcutaneous TID WC  . insulin aspart  0-5 Units Subcutaneous QHS  . insulin aspart  6 Units Subcutaneous TID WC  . insulin glargine  30 Units Subcutaneous QHS  . ipratropium-albuterol  3 mL Nebulization TID  . isosorbide mononitrate  30 mg Oral Daily  . methylPREDNISolone (SOLU-MEDROL) injection  60 mg Intravenous 3 times per day  . potassium chloride  40 mEq Oral Daily  . sodium chloride  3 mL Intravenous Q12H  . vancomycin  2,000 mg Intravenous Q48H      Paulene FloorJames Sie Formisano, MD 02/23/2015, 3:12 PM

## 2015-02-23 NOTE — Progress Notes (Signed)
Patient refused to be changed after being incontinent with urine.Husband at bedside.

## 2015-02-23 NOTE — Progress Notes (Signed)
Results for Neva SeatMCKINNONALSTON, Chasmine (MRN [id number]) as of 02/23/2015 12:20  Ref. Range 02/22/2015 12:30 02/22/2015 16:24 02/22/2015 22:24 02/23/2015 07:58 02/23/2015 12:12  Glucose-Capillary Latest Ref Range: 65-99 mg/dL 409322 (H) 811278 (H) 914346 (H) 389 (H) 334 (H)  Noted blood sugars are elevated greater than 300 mg/dl.  Recommend increasing Lantus to 30 units daily and will need to titrate dosage up as needed. Patient not eating too well or not consistently 50 % of meals. Patient does have renal issues, so insulin will have to be adjusted according to use of steroids and how patient is eating. Will continue to monitor blood sugars while in the hospital. Smith MinceKendra Brea Coleson RN BSN CDE

## 2015-02-23 NOTE — Progress Notes (Signed)
[facility] PROGRESS NOTE  Neva SeatDavenia Keown NWG:[id number] DOB: 07/23/1975 DOA: 02/19/2015 PCP: Lonia BloodGARBA,LAWAL, MD  Brief interval history Mrs Ennis FortsMcKinnonalston is a [age] year old female with a past medical history of morbid obesity having multiple comorbidities including congestive heart failure, hypertension, diabetes mellitus, stage IV chronic kidney disease presented to the emergency department with complaints of generalized weakness, fatigue, minimal by mouth intake and having overall functional decline.Marland Kitchen. She was recently discharged from the medicine service on 02/10/2015 at which time she was treated for acute on chronic diastolic congestive heart failure and acute on chronic renal failure. Lab work performed in the emergency department showed upward trend in her creatinine from 3.74 on 02/10/2015 to 5.84 on presentation. She was seen and evaluated by nephrology. Lasix was held. She was also found to have a skin tear in the right calf region.   Assessment/Plan: 1. Acute on chronic renal failure -Patient having a history of stage IV chronic kidney disease, recently hospitalized for acute on chronic renal failure, on 02/10/2015 had a creatinine of 3.74 with BUN of 30. She presented with generalized weakness and fatigue, with labs showing creatinine of 5.84 with BUN of 58. Patient likely worsening renal failure and may need hemodialysis. -She was seen and evaluated by nephrology recommending holding Lasix therapy, which she had been taking 120 mg twice daily. Patient has been started on IV Lasix per nephrology now at 80 every 12 hours as patient may have not responded to oral Lasix secondary to gut edema. Monitor output. Follow renal function. -On recent hospitalization had extensive workup for renal failure which was unrevealing. -I suspect hemodialysis is a future intervention although it appears patient agreeable to this at the time being. -Repeat labs on 02/21/2015 showing upward trend in  creatinine 5.83>5.96. Her lasix was restarted on 02/20/2015 at 80 mg IV BID and creatinine today is 5.17. Per nephrology.   2. Right calf cellulitis -She was found to have a 8 cm x 4 cm skin tear in the right calf with some associated erythema. Labs revealed a white count of 19,800 having a temperature of 101.5 raising concern for infection -She was started on empiric IV antibiotic therapy with vancomycin  -White count continues to rise 19.8>21>22.1, though has been afebrile for the past 24 hours. WBC trending back down likely a component of steroids. Patient has been seen by general surgery who feel no surgical intervention is needed at this time. Continue current wound care dressing changes.   3. Acute respiratory failure -Evidenced by a respiratory rate of 25 having an O2 sat of 89% on 2 L supplemental oxygen. -Likely multifactorial secondary to asthma exacerbation and acute diastolic heart failure and worsening renal function and obesity hypoventilation syndrome. Patient with some clinical improvement. Continue IV Lasix, IV steroid taper, nebulizer treatments.  4. History of diastolic congestive heart failure -Last transthoracic echocardiogram was performed on 02/05/2015 that showed a preserved ejection fraction with grade 1 diastolic dysfunction. -As mentioned above due to acute on chronic renal failure her Lasix will be held. She was previously taking 120 mg by mouth twice a day. -Lasix was restarted on 02/20/2015 at 80 mg IV BID by nephrology. Watching kidney function. Suspect will likely need HD.  -Will closely monitor volume status.  5. Insulin-dependent diabetes mellitus. -Hyperglycemia likely secondary to steroids -Increasing Lantus 30 units [place] q daily and continue resistant SSI.   6. Hypertension. -Blood pressures remain elevated with systolic blood pressures in the 160s to 180s -Continue Coreg 25 mg by mouth twice a  day, IV Lasix,IMDUR, Norvasc.  7. Hypokalemia -Replete.    8.  Escherichia coli UTI IV Rocephin.    Code Status: Full Family Communication: Updated patient and family at bedside. Disposition Plan: Home once volume overload has been corrected and renal function has improved and patient on oral diuretics.   Consultants:  Nephrology: Dr. Eliott Nine 02/19/2015  Gen. surgery Dr. Derrell Lolling 02/21/2015  Procedures:  Chest x-ray 02/19/2015, 02/20/2015  Antibiotics:  IV Rocephin 02/22/2015  IV vancomycin 02/19/2015  HPI/Subjective: Patient states shortness of breath has improved. Patient denies any chest pain. Patient asking when she can go home.  Objective: Filed Vitals:   02/23/15 0439 02/23/15 0851  BP: 160/88 167/79  Pulse: 66 74  Temp: 97.5 F (36.4 C) 98.2 F (36.8 C)  Resp: 20 18    Intake/Output Summary (Last 24 hours) at 02/23/15 1500 Last data filed at 02/23/15 1454  Gross per 24 hour  Intake    890 ml  Output      0 ml  Net    890 ml   Filed Weights   02/21/15 0500 02/21/15 2146 02/23/15 0439  Weight: 153.9 kg (339 lb 4.6 oz) 154 kg (339 lb 8.1 oz) 188.243 kg (415 lb)    Exam:   General:  NAD  Cardiovascular: RRR. Distant heart sounds secondary to body habitus.  Respiratory: Some scattered coarse crackles.  Abdomen: Obese, soft, nontender, nondistended, positive bowel sounds.  Musculoskeletal: No clubbing cyanosis. 2-3+ bilateral lower extremity edema. Large denuded area right calf with redness and induration.  Data Reviewed: Basic Metabolic Panel:  Recent Labs Lab 02/20/15 0300 02/21/15 0600 02/21/15 1630 02/22/15 0458 02/22/15 1235 02/23/15 0625  NA 138 137 135 136 134* 135  K 3.4* 2.9* 2.7* 3.3* 3.5 3.6  CL 99* 99* 98* 99* 99* 100*  CO2 23 24 24  21* 20* 23  GLUCOSE 239* 384* 366* 322* 353* 410*  BUN 67* 82* 81* 87* 91* 96*  CREATININE 5.83* 5.96* 5.73* 5.44* 5.35* 5.17*  CALCIUM 8.2* 8.3* 8.8* 8.7* 8.7* 8.7*  PHOS 4.5  --   --   --   --   --    Liver Function Tests:  Recent Labs Lab  02/20/15 0300  ALBUMIN 2.0*   No results for input(s): LIPASE, AMYLASE in the last 168 hours. No results for input(s): AMMONIA in the last 168 hours. CBC:  Recent Labs Lab 02/19/15 1123 02/20/15 0300 02/21/15 0600 02/22/15 0458 02/23/15 0625  WBC 19.8* 21.0* 22.1* 18.3* 14.8*  NEUTROABS 15.2*  --   --   --  13.5*  HGB 10.4* 8.8* 9.5* 9.5* 9.9*  HCT 32.3* 26.8* 29.2* 28.7* 30.0*  MCV 82.2 80.7 80.0 79.5 80.6  PLT 172 191 240 302 340   Cardiac Enzymes: No results for input(s): CKTOTAL, CKMB, CKMBINDEX, TROPONINI in the last 168 hours. BNP (last 3 results)  Recent Labs  02/04/15 0031 02/19/15 1123  BNP 349.2* 921.9*    ProBNP (last 3 results) No results for input(s): PROBNP in the last 8760 hours.  CBG:  Recent Labs Lab 02/22/15 1230 02/22/15 1624 02/22/15 2224 02/23/15 0758 02/23/15 1212  GLUCAP 322* 278* 346* 389* 334*    Recent Results (from the past 240 hour(s))  Culture, blood (routine x 2)     Status: None (Preliminary result)   Collection Time: 02/19/15 12:53 PM  Result Value Ref Range Status   Specimen Description BLOOD RIGHT FOREARM  Final   Special Requests BOTTLES DRAWN AEROBIC AND ANAEROBIC  Final  Culture NO GROWTH 4 DAYS  Final   Report Status PENDING  Incomplete  Culture, blood (routine x 2)     Status: None (Preliminary result)   Collection Time: 02/19/15  1:00 PM  Result Value Ref Range Status   Specimen Description BLOOD LEFT ANTECUBITAL  Final   Special Requests BOTTLES DRAWN AEROBIC AND ANAEROBIC  Final   Culture NO GROWTH 4 DAYS  Final   Report Status PENDING  Incomplete  C difficile quick scan w PCR reflex     Status: None   Collection Time: 02/20/15  2:14 PM  Result Value Ref Range Status   C Diff antigen NEGATIVE NEGATIVE Final   C Diff toxin NEGATIVE NEGATIVE Final   C Diff interpretation Negative for toxigenic C. difficile  Final  Urine culture     Status: None (Preliminary result)   Collection Time: 02/22/15   9:48 AM  Result Value Ref Range Status   Specimen Description URINE, RANDOM  Final   Special Requests NONE  Final   Culture >=100,000 COLONIES/mL ESCHERICHIA COLI  Final   Report Status PENDING  Incomplete     Studies: No results found.  Scheduled Meds: . acetaminophen  1,000 mg Oral Once  . amLODipine  10 mg Oral Daily  . carvedilol  25 mg Oral BID WC  . cefTRIAXone (ROCEPHIN)  IV  1 g Intravenous Q24H  . furosemide  80 mg Intravenous BID  . heparin  5,000 Units Subcutaneous 3 times per day  . insulin aspart  0-20 Units Subcutaneous TID WC  . insulin aspart  0-5 Units Subcutaneous QHS  . insulin aspart  6 Units Subcutaneous TID WC  . insulin glargine  30 Units Subcutaneous QHS  . ipratropium-albuterol  3 mL Nebulization TID  . isosorbide mononitrate  30 mg Oral Daily  . methylPREDNISolone (SOLU-MEDROL) injection  60 mg Intravenous 3 times per day  . potassium chloride  40 mEq Oral Daily  . sodium chloride  3 mL Intravenous Q12H  . vancomycin  2,000 mg Intravenous Q48H   Continuous Infusions:   Principal Problem:   Acute respiratory failure with hypoxia (HCC) Active Problems:   Diabetes mellitus with hyperglycemia (HCC)   AKI (acute kidney injury) (HCC)   Chronic kidney disease (CKD), stage IV (severe) (HCC)   Cellulitis   History of stroke   Asthma exacerbation   Acute diastolic (congestive) heart failure (HCC)   Leukocytosis   E-coli UTI    Time spent: 35 minutes    [facility] MD [facility] Pager [phone]. If 7PM-7AM, please contact night-coverage at [email or web address], password [facility] 02/23/2015, 3:00 PM  LOS: 4 days

## 2015-02-24 LAB — CBC
HCT: 31.8 % — ABNORMAL LOW (ref 36.0–46.0)
Hemoglobin: 10.4 g/dL — ABNORMAL LOW (ref 12.0–15.0)
MCH: 26.4 pg (ref 26.0–34.0)
MCHC: 32.7 g/dL (ref 30.0–36.0)
MCV: 80.7 fL (ref 78.0–100.0)
Platelets: 386 10*3/uL (ref 150–400)
RBC: 3.94 MIL/uL (ref 3.87–5.11)
RDW: 14.2 % (ref 11.5–15.5)
WBC: 16.9 10*3/uL — AB (ref 4.0–10.5)

## 2015-02-24 LAB — CULTURE, BLOOD (ROUTINE X 2)
CULTURE: NO GROWTH
CULTURE: NO GROWTH

## 2015-02-24 LAB — RENAL FUNCTION PANEL
ALBUMIN: 1.9 g/dL — AB (ref 3.5–5.0)
Anion gap: 11 (ref 5–15)
BUN: 97 mg/dL — AB (ref 6–20)
CHLORIDE: 102 mmol/L (ref 101–111)
CO2: 23 mmol/L (ref 22–32)
CREATININE: 4.97 mg/dL — AB (ref 0.44–1.00)
Calcium: 8.6 mg/dL — ABNORMAL LOW (ref 8.9–10.3)
GFR, EST AFRICAN AMERICAN: 12 mL/min — AB (ref >60)
GFR, EST NON AFRICAN AMERICAN: 10 mL/min — AB (ref >60)
Glucose, Bld: 274 mg/dL — ABNORMAL HIGH (ref 65–99)
PHOSPHORUS: 4.3 mg/dL (ref 2.5–4.6)
POTASSIUM: 3.4 mmol/L — AB (ref 3.5–5.1)
Sodium: 136 mmol/L (ref 135–145)

## 2015-02-24 LAB — URINE CULTURE: Culture: >=100000

## 2015-02-24 LAB — GLUCOSE, CAPILLARY
GLUCOSE-CAPILLARY: 294 mg/dL — AB (ref 65–99)
GLUCOSE-CAPILLARY: 291 mg/dL — AB (ref 65–99)
Glucose-Capillary: 270 mg/dL — ABNORMAL HIGH (ref 65–99)
Glucose-Capillary: 222 mg/dL — ABNORMAL HIGH (ref 65–99)

## 2015-02-24 MED ORDER — METHYLPREDNISOLONE SODIUM SUCC 125 MG IJ SOLR
60.0000 mg | Freq: Two times a day (BID) | INTRAMUSCULAR | Status: DC
  Administered 2015-02-24 – 2015-02-25 (×2): 60 mg via INTRAVENOUS
  Filled 2015-02-24 (×2): qty 2

## 2015-02-24 MED ORDER — CEPHALEXIN 250 MG PO CAPS
250.0000 mg | ORAL_CAPSULE | Freq: Three times a day (TID) | ORAL | Status: DC
  Administered 2015-02-24 – 2015-02-27 (×9): 250 mg via ORAL
  Filled 2015-02-24 (×15): qty 1

## 2015-02-24 MED ORDER — IPRATROPIUM-ALBUTEROL 0.5-2.5 (3) MG/3ML IN SOLN: 3.0000 mL | RESPIRATORY_TRACT | Status: DC | PRN

## 2015-02-24 MED ORDER — DOXYCYCLINE HYCLATE 100 MG PO TABS
100.0000 mg | ORAL_TABLET | Freq: Two times a day (BID) | ORAL | Status: DC
  Administered 2015-02-24 – 2015-02-27 (×6): 100 mg via ORAL
  Filled 2015-02-24 (×6): qty 1

## 2015-02-24 NOTE — Progress Notes (Signed)
Pharmacy Antibiotic Follow-up Note  [name] is a 40 y.o. year-old female admitted on 02/19/2015.  The patient is currently on day 6 of Vancomycin for cellulitis.  Assessment/Plan: This patient's current antibiotics will be continued without adjustments.  Continue to follow  - What is LOT for Vancomycin?  Temp (24hrs), Avg:98.2 F (36.8 C), Min:97.5 F (36.4 C), Max:98.7 F (37.1 C)   Recent Labs Lab 02/20/15 0300 02/21/15 0600 02/22/15 0458 02/23/15 0625 02/24/15 0552  WBC 21.0* 22.1* 18.3* 14.8* 16.9*     Recent Labs Lab 02/21/15 1630 02/22/15 0458 02/22/15 1235 02/23/15 0625 02/24/15 0552  CREATININE 5.73* 5.44* 5.35* 5.17* 4.97*   Estimated Creatinine Clearance: 26.6 mL/min (by C-G formula based on Cr of 4.97).    Allergies  Allergen Reactions  . Azithromycin Other (See Comments)    Nose bleeding event    Antimicrobials this admission: 1/8 Vanc>>  1/8 BCx2>>ngtd 1/9 cdiff neg  Thank you for allowing pharmacy to be a part of this patient's care. Okey RegalLisa Eden Toohey, PharmD [phone]  02/24/2015 11:20 AM

## 2015-02-24 NOTE — Progress Notes (Signed)
[facility] PROGRESS NOTE  [name] ZOX:[id number] DOB: April 02, 1975 DOA: 02/19/2015 PCP: Lonia Blood, MD  Brief interval history [name] is a 40 year old female with a past medical history of morbid obesity having multiple comorbidities including congestive heart failure, hypertension, diabetes mellitus, stage IV chronic kidney disease presented to the emergency department with complaints of generalized weakness, fatigue, minimal by mouth intake and having overall functional decline.Marland Kitchen She was recently discharged from the medicine service on 02/10/2015 at which time she was treated for acute on chronic diastolic congestive heart failure and acute on chronic renal failure. Lab work performed in the emergency department showed upward trend in her creatinine from 3.74 on 02/10/2015 to 5.84 on presentation. She was seen and evaluated by nephrology. Lasix was held. She was also found to have a skin tear in the right calf region.   Assessment/Plan: 1. Acute on chronic renal failure -Patient having a history of stage IV chronic kidney disease, recently hospitalized for acute on chronic renal failure, on 02/10/2015 had a creatinine of 3.74 with BUN of 30. She presented with generalized weakness and fatigue, with labs showing creatinine of 5.84 with BUN of 58. Patient likely worsening renal failure and may need hemodialysis. -She was seen and evaluated by nephrology recommending holding Lasix therapy, which she had been taking 120 mg twice daily. Patient has been started on IV Lasix per nephrology now at 80 every 12 hours as patient may have not responded to oral Lasix secondary to gut edema. Monitor output. Follow renal function. -On recent hospitalization had extensive workup for renal failure which was unrevealing. -I suspect hemodialysis is a future intervention although it appears patient agreeable to this at the time being. -Repeat labs on 02/21/2015 showing upward trend in  creatinine 5.83>5.96. Her lasix was restarted on 02/20/2015 at 80 mg IV BID and creatinine today is trending back down and currently at 4.97. Per nephrology.   2. Right calf cellulitis -She was found to have a 8 cm x 4 cm skin tear in the right calf with some associated erythema. Labs revealed a white count of 19,800 having a temperature of 101.5 raising concern for infection -She was started on empiric IV antibiotic therapy with vancomycin  -White count fluctuating and currently at 16.9 from 22.1 on admission. Patient  has been afebrile for the past 24 hours. WBC trending back down likely a component of steroids. Patient has been seen by general surgery who feel no surgical intervention is needed at this time. Continue current wound care dressing changes. DC IV vancomycin and IV Rocephin and place on oral doxycycline and Keflex.  3. Acute respiratory failure -Evidenced by a respiratory rate of 25 having an O2 sat of 89% on 2 L supplemental oxygen. -Likely multifactorial secondary to asthma exacerbation and acute diastolic heart failure and worsening renal function and obesity hypoventilation syndrome. Patient with some clinical improvement. Continue IV Lasix, IV steroid taper, nebulizer treatments.  4. History of diastolic congestive heart failure -Last transthoracic echocardiogram was performed on 02/05/2015 that showed a preserved ejection fraction with grade 1 diastolic dysfunction. -As mentioned above due to acute on chronic renal failure her Lasix will be held. She was previously taking 120 mg by mouth twice a day. -Lasix was restarted on 02/20/2015 at 80 mg IV BID by nephrology. Watching kidney function. Suspect will likely need HD.  -Will closely monitor volume status.  5. Insulin-dependent diabetes mellitus. -Hyperglycemia likely secondary to steroids. CBGs have ranged from 272-294. -Increasing Lantus 30 units [place] q daily  and continue resistant SSI. Monitor CBGs with steroid  taper.  6. Hypertension. -Blood pressures remain elevated with systolic blood pressures in the 130s to 150s -Continue Coreg 25 mg by mouth twice a day, IV Lasix,IMDUR, Norvasc.  7. Hypokalemia -Replete.   8.  Escherichia coli UTI Change IV Rocephin to oral Keflex to complete course of treatment.   Code Status: Full Family Communication: Updated patient and family at bedside. Disposition Plan: Home once volume overload has been corrected and renal function has improved and patient on oral diuretics.   Consultants:  Nephrology: Dr. Eliott Nine 02/19/2015  Gen. surgery Dr. Derrell Lolling 02/21/2015  Procedures:  Chest x-ray 02/19/2015, 02/20/2015  Antibiotics:  IV Rocephin 02/22/2015>>>> 02/24/2015  IV vancomycin 02/19/2015>>> 02/24/2015  Oral doxycycline 02/24/2015  Oral Keflex 02/24/2015  HPI/Subjective: Patient states shortness of breath has improved. Patient denies any chest pain. P  Objective: Filed Vitals:   02/24/15 0426 02/24/15 0907  BP: 156/72 139/72  Pulse: 76 72  Temp: 98.1 F (36.7 C) 98.3 F (36.8 C)  Resp: 19 20    Intake/Output Summary (Last 24 hours) at 02/24/15 1609 Last data filed at 02/24/15 0555  Gross per 24 hour  Intake   1290 ml  Output    400 ml  Net    890 ml   Filed Weights   02/21/15 2146 02/23/15 0439 02/23/15 2231  Weight: 154 kg (339 lb 8.1 oz) 188.243 kg (415 lb) 188.4 kg (415 lb 5.6 oz)    Exam:   General:  NAD  Cardiovascular: RRR. Distant heart sounds secondary to body habitus.  Respiratory: Some scattered coarse crackles.  Abdomen: Obese, soft, nontender, nondistended, positive bowel sounds.  Musculoskeletal: No clubbing cyanosis. 2-3+ bilateral lower extremity edema. Large denuded area right calf with less redness and induration, foul odor.  Data Reviewed: Basic Metabolic Panel:  Recent Labs Lab 02/20/15 0300  02/21/15 1630 02/22/15 0458 02/22/15 1235 02/23/15 0625 02/24/15 0552  NA 138  < > 135 136 134*  135 136  K 3.4*  < > 2.7* 3.3* 3.5 3.6 3.4*  CL 99*  < > 98* 99* 99* 100* 102  CO2 23  < > 24 21* 20* 23 23  GLUCOSE 239*  < > 366* 322* 353* 410* 274*  BUN 67*  < > 81* 87* 91* 96* 97*  CREATININE 5.83*  < > 5.73* 5.44* 5.35* 5.17* 4.97*  CALCIUM 8.2*  < > 8.8* 8.7* 8.7* 8.7* 8.6*  PHOS 4.5  --   --   --   --   --  4.3  < > = values in this interval not displayed. Liver Function Tests:  Recent Labs Lab 02/20/15 0300 02/24/15 0552  ALBUMIN 2.0* 1.9*   No results for input(s): LIPASE, AMYLASE in the last 168 hours. No results for input(s): AMMONIA in the last 168 hours. CBC:  Recent Labs Lab 02/19/15 1123 02/20/15 0300 02/21/15 0600 02/22/15 0458 02/23/15 0625 02/24/15 0552  WBC 19.8* 21.0* 22.1* 18.3* 14.8* 16.9*  NEUTROABS 15.2*  --   --   --  13.5*  --   HGB 10.4* 8.8* 9.5* 9.5* 9.9* 10.4*  HCT 32.3* 26.8* 29.2* 28.7* 30.0* 31.8*  MCV 82.2 80.7 80.0 79.5 80.6 80.7  PLT 172 191 240 302 340 386   Cardiac Enzymes: No results for input(s): CKTOTAL, CKMB, CKMBINDEX, TROPONINI in the last 168 hours. BNP (last 3 results)  Recent Labs  02/04/15 0031 02/19/15 1123  BNP 349.2* 921.9*    ProBNP (last 3 results)  No results for input(s): PROBNP in the last 8760 hours.  CBG:  Recent Labs Lab 02/23/15 0758 02/23/15 1212 02/23/15 1706 02/24/15 0751 02/24/15 1205  GLUCAP 389* 334* 305* 270* 294*    Recent Results (from the past 240 hour(s))  Culture, blood (routine x 2)     Status: None   Collection Time: 02/19/15 12:53 PM  Result Value Ref Range Status   Specimen Description BLOOD RIGHT FOREARM  Final   Special Requests BOTTLES DRAWN AEROBIC AND ANAEROBIC 10MLS  Final   Culture NO GROWTH 5 DAYS  Final   Report Status 02/24/2015 FINAL  Final  Culture, blood (routine x 2)     Status: None   Collection Time: 02/19/15  1:00 PM  Result Value Ref Range Status   Specimen Description BLOOD LEFT ANTECUBITAL  Final   Special Requests BOTTLES DRAWN AEROBIC AND  ANAEROBIC 10MLS  Final   Culture NO GROWTH 5 DAYS  Final   Report Status 02/24/2015 FINAL  Final  C difficile quick scan w PCR reflex     Status: None   Collection Time: 02/20/15  2:14 PM  Result Value Ref Range Status   C Diff antigen NEGATIVE NEGATIVE Final   C Diff toxin NEGATIVE NEGATIVE Final   C Diff interpretation Negative for toxigenic C. difficile  Final  Urine culture     Status: None   Collection Time: 02/22/15  9:48 AM  Result Value Ref Range Status   Specimen Description URINE, RANDOM  Final   Special Requests NONE  Final   Culture >=100,000 COLONIES/mL ESCHERICHIA COLI  Final   Report Status 02/24/2015 FINAL  Final   Organism ID, Bacteria ESCHERICHIA COLI  Final      Susceptibility   Escherichia coli - MIC*    AMPICILLIN <=2 SENSITIVE Sensitive     CEFAZOLIN <=4 SENSITIVE Sensitive     CEFTRIAXONE <=1 SENSITIVE Sensitive     CIPROFLOXACIN <=0.25 SENSITIVE Sensitive     GENTAMICIN <=1 SENSITIVE Sensitive     IMIPENEM <=0.25 SENSITIVE Sensitive     NITROFURANTOIN <=16 SENSITIVE Sensitive     TRIMETH/SULFA <=20 SENSITIVE Sensitive     AMPICILLIN/SULBACTAM <=2 SENSITIVE Sensitive     PIP/TAZO <=4 SENSITIVE Sensitive     * >=100,000 COLONIES/mL ESCHERICHIA COLI     Studies: No results found.  Scheduled Meds: . acetaminophen  1,000 mg Oral Once  . amLODipine  10 mg Oral Daily  . carvedilol  25 mg Oral BID WC  . cefTRIAXone (ROCEPHIN)  IV  1 g Intravenous Q24H  . furosemide  80 mg Intravenous BID  . heparin  5,000 Units Subcutaneous 3 times per day  . insulin aspart  0-20 Units Subcutaneous TID WC  . insulin aspart  0-5 Units Subcutaneous QHS  . insulin aspart  6 Units Subcutaneous TID WC  . insulin glargine  30 Units Subcutaneous QHS  . isosorbide mononitrate  30 mg Oral Daily  . methylPREDNISolone (SOLU-MEDROL) injection  60 mg Intravenous Q12H  . potassium chloride  40 mEq Oral Daily  . sodium chloride  3 mL Intravenous Q12H  . vancomycin  2,000 mg  Intravenous Q48H   Continuous Infusions:   Principal Problem:   Acute respiratory failure with hypoxia (HCC) Active Problems:   Diabetes mellitus with hyperglycemia (HCC)   AKI (acute kidney injury) (HCC)   Chronic kidney disease (CKD), stage IV (severe) (HCC)   Cellulitis   History of stroke   Asthma exacerbation   Acute diastolic (congestive) heart  failure (HCC)   Leukocytosis   E-coli UTI    Time spent: 35 minutes    [facility] MD [facility] Pager [phone]. If 7PM-7AM, please contact night-coverage at [email or web address], password [facility] 02/24/2015, 4:09 PM  LOS: 5 days

## 2015-02-24 NOTE — Progress Notes (Signed)
[facility] Progress Note    Assessment/ Plan:   Background: 40 y.o. 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 Recently discharged after admission for dCHF exacerbation, creatinine increase from 2.98->3.74 with diuresis for CHF (lost 30lb) who returns now with febrile illness, cough, SOB, leg ulcer and worsening renal function - creatinine up to 5.84 and we are asked to see.    1. AKI on CKD4 - creatinine was still rising when she left the hospital 8 days ago so I don't know if this is acute or part of the insidious rise she was already experiencing as she was (very appropriately) diuresing. Sepsis could be playing a role here as well. She still has gross anasarca but her CXR is relatively clear - I wonder if she wasn't responding to the oral diuretics because of gut edema bec she has anasarca  - Restarted Lasix 80mg  twice daily IV on 1/9 and monitoring response (continue for now). Hopefully by improving her volume status her cardiac function / cardio- renal syndrome will improve as well.  - She will ultimately benefit from dialysis to optimally control her volume - I'm not sure if we can do it with diuretics and still spare renal function; she was very much against being on dialysis but will continue to broach the subject and educate her. She is not far from dialysis and likely will need dialysis to manage the volume status. Her renal function likely will worsen with the diuretics that she needs. - Start KlorCon 40meq daily while she's on diuretics (likely lifelong)  - Fortunately renal function appears to have stabilized with a decrease over the past 72 hrs --> will continue to monitor. Will take up to 2 weeks to get back to baseline; lowest cr that we have is 2.98. [facility].   2. Febrile illness - CXR neg for PNA. R calf likely the  culprit.  3. Acute resp failure - Asthma exacerbation (cardiac asthma?), possible exacerbation dCHF but CXR clear, weight down another 3 lb since discharge on 12/30. Clearly has LOTS of extra volume on board - pulm responding to nebs, steroids but still with persistent cough. 4. Grade 1 DD on ECHO 02/05/15 - unclear how much of her resp sx is asthma exac vs dCHF. See above discussion 1,2 5. Morbid obesity 6. DM2 7. HTN 8. Anemia. Hb>10. No ESA requirement as of yet 9. Reduced health literacy - this pt will require a LOT of education - very limited understanding  Subjective:   Feels breathing is about the same although she has a cough productive of only whitish sputum. She is not sure how much she is voiding but states sheets have been wet. Denies fever, chills, nausea.   Overall feels better; more frequent bowel movements.  Husband and daughter bedside. Very supportive family.   Objective:   BP 139/72 mmHg  Pulse 72  Temp(Src) 98.3 F (36.8 C) (Oral)  Resp 20  Ht 5\' 5"  (1.651 m)  Wt 188.4 kg (415 lb 5.6 oz)  BMI 69.12 kg/m2  SpO2 97%  Intake/Output Summary (Last 24 hours) at 02/24/15 1308 Last data filed at 02/24/15 0555  Gross per 24 hour  Intake   1530 ml  Output    400 ml  Net   1130 ml   Weight change: 0.157 kg (5.6 oz)  Physical Exam: Gen: Morbidly  obese AAF lying on stretcher - coughing (loose cough) and wheezing Skin: Numerous tattoos Neck: I cannot see her neck veins due to her large beefy neck Chest: Coarse crackles  Heart: regular, no rub or gallop Abdomen: soft, S1S2 No S3 or S4 Ext: 2-3++ pitting edema to the knees bilaterally.  Large denuded area R calf with redness and induration surrounding the area of skin tear.  Neuro: alert, Ox3  Imaging: No results found.  Labs: BMET  Recent Labs Lab 02/20/15 0300 02/21/15 0600 02/21/15 1630 02/22/15 0458 02/22/15 1235 02/23/15 0625 02/24/15 0552  NA 138 137 135 136 134* 135 136  K 3.4* 2.9* 2.7*  3.3* 3.5 3.6 3.4*  CL 99* 99* 98* 99* 99* 100* 102  CO2 23 24 24  21* 20* 23 23  GLUCOSE 239* 384* 366* 322* 353* 410* 274*  BUN 67* 82* 81* 87* 91* 96* 97*  CREATININE 5.83* 5.96* 5.73* 5.44* 5.35* 5.17* 4.97*  CALCIUM 8.2* 8.3* 8.8* 8.7* 8.7* 8.7* 8.6*  PHOS 4.5  --   --   --   --   --  4.3   CBC  Recent Labs Lab 02/19/15 1123  02/21/15 0600 02/22/15 0458 02/23/15 0625 02/24/15 0552  WBC 19.8*  < > 22.1* 18.3* 14.8* 16.9*  NEUTROABS 15.2*  --   --   --  13.5*  --   HGB 10.4*  < > 9.5* 9.5* 9.9* 10.4*  HCT 32.3*  < > 29.2* 28.7* 30.0* 31.8*  MCV 82.2  < > 80.0 79.5 80.6 80.7  PLT 172  < > 240 302 340 386  < > = values in this interval not displayed.  Medications:    . acetaminophen  1,000 mg Oral Once  . amLODipine  10 mg Oral Daily  . carvedilol  25 mg Oral BID WC  . cefTRIAXone (ROCEPHIN)  IV  1 g Intravenous Q24H  . furosemide  80 mg Intravenous BID  . heparin  5,000 Units Subcutaneous 3 times per day  . insulin aspart  0-20 Units Subcutaneous TID WC  . insulin aspart  0-5 Units Subcutaneous QHS  . insulin aspart  6 Units Subcutaneous TID WC  . insulin glargine  30 Units Subcutaneous QHS  . isosorbide mononitrate  30 mg Oral Daily  . methylPREDNISolone (SOLU-MEDROL) injection  60 mg Intravenous Q12H  . potassium chloride  40 mEq Oral Daily  . sodium chloride  3 mL Intravenous Q12H  . vancomycin  2,000 mg Intravenous Q48H      Paulene Floor, MD 02/24/2015, 1:08 PM

## 2015-02-25 ENCOUNTER — Inpatient Hospital Stay (HOSPITAL_COMMUNITY): Payer: Medicaid Other

## 2015-02-25 LAB — CBC
HEMATOCRIT: 33.4 % — AB (ref 36.0–46.0)
HEMOGLOBIN: 11.3 g/dL — AB (ref 12.0–15.0)
MCH: 27.2 pg (ref 26.0–34.0)
MCHC: 33.8 g/dL (ref 30.0–36.0)
MCV: 80.3 fL (ref 78.0–100.0)
Platelets: 349 10*3/uL (ref 150–400)
RBC: 4.16 MIL/uL (ref 3.87–5.11)
RDW: 14.2 % (ref 11.5–15.5)
WBC: 14.1 10*3/uL — AB (ref 4.0–10.5)

## 2015-02-25 LAB — GLUCOSE, CAPILLARY
GLUCOSE-CAPILLARY: 199 mg/dL — AB (ref 65–99)
GLUCOSE-CAPILLARY: 213 mg/dL — AB (ref 65–99)
GLUCOSE-CAPILLARY: 186 mg/dL — AB (ref 65–99)
Glucose-Capillary: 237 mg/dL — ABNORMAL HIGH (ref 65–99)

## 2015-02-25 LAB — RENAL FUNCTION PANEL
ALBUMIN: 1.9 g/dL — AB (ref 3.5–5.0)
ANION GAP: 11 (ref 5–15)
BUN: 100 mg/dL — ABNORMAL HIGH (ref 6–20)
CALCIUM: 8.6 mg/dL — AB (ref 8.9–10.3)
CO2: 21 mmol/L — ABNORMAL LOW (ref 22–32)
Chloride: 105 mmol/L (ref 101–111)
Creatinine, Ser: 4.57 mg/dL — ABNORMAL HIGH (ref 0.44–1.00)
GFR calc non Af Amer: 11 mL/min — ABNORMAL LOW (ref >60)
GFR, EST AFRICAN AMERICAN: 13 mL/min — AB (ref >60)
Glucose, Bld: 185 mg/dL — ABNORMAL HIGH (ref 65–99)
PHOSPHORUS: 4.6 mg/dL (ref 2.5–4.6)
POTASSIUM: 3.9 mmol/L (ref 3.5–5.1)
SODIUM: 137 mmol/L (ref 135–145)

## 2015-02-25 MED ORDER — PREDNISONE 50 MG PO TABS
60.0000 mg | ORAL_TABLET | Freq: Every day | ORAL | Status: DC
  Administered 2015-02-26 – 2015-02-27 (×2): 60 mg via ORAL
  Filled 2015-02-25 (×2): qty 1

## 2015-02-25 NOTE — Progress Notes (Signed)
[facility] PROGRESS NOTE  Shaima Sardinas ZOX:[id number] DOB: 1975/07/24 DOA: 02/19/2015 PCP: Lonia Blood, MD  Brief interval history [name] [name] is a 40 year old female with a past medical history of morbid obesity having multiple comorbidities including congestive heart failure, hypertension, diabetes mellitus, stage IV chronic kidney disease presented to the emergency department with complaints of generalized weakness, fatigue, minimal by mouth intake and having overall functional decline.Marland Kitchen She was recently discharged from the medicine service on 02/10/2015 at which time she was treated for acute on chronic diastolic congestive heart failure and acute on chronic renal failure. Lab work performed in the emergency department showed upward trend in her creatinine from 3.74 on 02/10/2015 to 5.84 on presentation. She was seen and evaluated by nephrology. Lasix was held. She was also found to have a skin tear in the right calf region.   Assessment/Plan: 1. Acute on chronic renal failure -Patient having a history of stage IV chronic kidney disease, recently hospitalized for acute on chronic renal failure, on 02/10/2015 had a creatinine of 3.74 with BUN of 30. She presented with generalized weakness and fatigue, with labs showing creatinine of 5.84 with BUN of 58. Patient likely worsening renal failure and may need hemodialysis. -She was seen and evaluated by nephrology recommending holding Lasix therapy, which she had been taking 120 mg twice daily. Patient has been started on IV Lasix per nephrology now at 80 every 12 hours as patient may have not responded to oral Lasix secondary to gut edema. Monitor output. Follow renal function. -On recent hospitalization had extensive workup for renal failure which was unrevealing. -I suspect hemodialysis is a future intervention although it appears patient agreeable to this at the time being. -Repeat labs on 02/21/2015 showing upward trend in  creatinine 5.83>5.96. Her lasix was restarted on 02/20/2015 at 80 mg IV BID and creatinine today is trending back down and currently at 4.57. Per nephrology.   2. Right calf cellulitis -She was found to have a 8 cm x 4 cm skin tear in the right calf with some associated erythema. Labs revealed a white count of 19,800 having a temperature of 101.5 raising concern for infection -She was started on empiric IV antibiotic therapy with vancomycin  -White count fluctuating and currently at 16.9 from 22.1 on admission. Patient  has been afebrile for the past 24 hours. WBC trending back down likely a component of steroids. Patient has been seen by general surgery who feel no surgical intervention is needed at this time. Continue current wound care dressing changes. Continue oral doxycycline and Keflex. CT tib/ fib negative for abscess.  3. Acute respiratory failure -Evidenced by a respiratory rate of 25 having an O2 sat of 89% on 2 L supplemental oxygen. -Likely multifactorial secondary to asthma exacerbation and acute diastolic heart failure and worsening renal function and obesity hypoventilation syndrome. Patient with clinical improvement. Continue IV Lasix, nebulizer treatments.Change IV steriods to oral steriod taper.  4. History of diastolic congestive heart failure -Last transthoracic echocardiogram was performed on 02/05/2015 that showed a preserved ejection fraction with grade 1 diastolic dysfunction. -As mentioned above due to acute on chronic renal failure her Lasix will be held. She was previously taking 120 mg by mouth twice a day. -Lasix was restarted on 02/20/2015 at 80 mg IV BID by nephrology. Watching kidney function. Suspect will likely need HD.  -Will closely monitor volume status.  5. Insulin-dependent diabetes mellitus. -Hyperglycemia likely secondary to steroids. CBGs have ranged from 119-237. -Continue Lantus 30 units Howe q daily  and continue resistant SSI. Monitor CBGs with  steroid taper.  6. Hypertension. -Blood pressures remain elevated with systolic blood pressures in the 130s to 150s -Continue Coreg 25 mg by mouth twice a day, IV Lasix,IMDUR, Norvasc.  7. Hypokalemia -Repleted.   8.  Escherichia coli UTI Continue oral Keflex to complete course of treatment.   Code Status: Full Family Communication: Updated patient and family at bedside. Disposition Plan: Home once volume overload has been corrected and renal function has improved and patient on oral diuretics.   Consultants:  Nephrology: Dr. Eliott Nine 02/19/2015  Gen. surgery Dr. Derrell Lolling 02/21/2015  Procedures:  Chest x-ray 02/19/2015, 02/20/2015  CT tib fib 02/25/2015  Antibiotics:  IV Rocephin 02/22/2015>>>> 02/24/2015  IV vancomycin 02/19/2015>>> 02/24/2015  Oral doxycycline 02/24/2015  Oral Keflex 02/24/2015  HPI/Subjective: Patient denies SOB. No CP.  Objective: Filed Vitals:   02/25/15 0900 02/25/15 1650  BP: 180/96 151/82  Pulse: 69 74  Temp: 97.6 F (36.4 C) 98.4 F (36.9 C)  Resp: 19 18    Intake/Output Summary (Last 24 hours) at 02/25/15 1820 Last data filed at 02/25/15 1651  Gross per 24 hour  Intake    843 ml  Output      0 ml  Net    843 ml   Filed Weights   02/23/15 0439 02/23/15 2231 02/25/15 0553  Weight: 188.243 kg (415 lb) 188.4 kg (415 lb 5.6 oz) 189.15 kg (417 lb)    Exam:   General:  NAD  Cardiovascular: RRR. Distant heart sounds secondary to body habitus.  Respiratory: CTAB.  Abdomen: Obese, soft, nontender, nondistended, positive bowel sounds.  Musculoskeletal: No clubbing cyanosis. 2+ bilateral lower extremity edema. Large denuded area right calf with less redness and induration, foul odor.  Data Reviewed: Basic Metabolic Panel:  Recent Labs Lab 02/20/15 0300  02/22/15 0458 02/22/15 1235 02/23/15 0625 02/24/15 0552 02/25/15 0541  NA 138  < > 136 134* 135 136 137  K 3.4*  < > 3.3* 3.5 3.6 3.4* 3.9  CL 99*  < > 99* 99*  100* 102 105  CO2 23  < > 21* 20* 23 23 21*  GLUCOSE 239*  < > 322* 353* 410* 274* 185*  BUN 67*  < > 87* 91* 96* 97* 100*  CREATININE 5.83*  < > 5.44* 5.35* 5.17* 4.97* 4.57*  CALCIUM 8.2*  < > 8.7* 8.7* 8.7* 8.6* 8.6*  PHOS 4.5  --   --   --   --  4.3 4.6  < > = values in this interval not displayed. Liver Function Tests:  Recent Labs Lab 02/20/15 0300 02/24/15 0552 02/25/15 0541  ALBUMIN 2.0* 1.9* 1.9*   No results for input(s): LIPASE, AMYLASE in the last 168 hours. No results for input(s): AMMONIA in the last 168 hours. CBC:  Recent Labs Lab 02/19/15 1123  02/21/15 0600 02/22/15 0458 02/23/15 0625 02/24/15 0552 02/25/15 0541  WBC 19.8*  < > 22.1* 18.3* 14.8* 16.9* 14.1*  NEUTROABS 15.2*  --   --   --  13.5*  --   --   HGB 10.4*  < > 9.5* 9.5* 9.9* 10.4* 11.3*  HCT 32.3*  < > 29.2* 28.7* 30.0* 31.8* 33.4*  MCV 82.2  < > 80.0 79.5 80.6 80.7 80.3  PLT 172  < > 240 302 340 386 349  < > = values in this interval not displayed. Cardiac Enzymes: No results for input(s): CKTOTAL, CKMB, CKMBINDEX, TROPONINI in the last 168 hours. BNP (last 3  results)  Recent Labs  02/04/15 0031 02/19/15 1123  BNP 349.2* 921.9*    ProBNP (last 3 results) No results for input(s): PROBNP in the last 8760 hours.  CBG:  Recent Labs Lab 02/24/15 1734 02/24/15 2141 02/25/15 0905 02/25/15 1201 02/25/15 1654  GLUCAP 291* 222* 237* 199* 213*    Recent Results (from the past 240 hour(s))  Culture, blood (routine x 2)     Status: None   Collection Time: 02/19/15 12:53 PM  Result Value Ref Range Status   Specimen Description BLOOD RIGHT FOREARM  Final   Special Requests BOTTLES DRAWN AEROBIC AND ANAEROBIC 10MLS  Final   Culture NO GROWTH 5 DAYS  Final   Report Status 02/24/2015 FINAL  Final  Culture, blood (routine x 2)     Status: None   Collection Time: 02/19/15  1:00 PM  Result Value Ref Range Status   Specimen Description BLOOD LEFT ANTECUBITAL  Final   Special Requests  BOTTLES DRAWN AEROBIC AND ANAEROBIC 10MLS  Final   Culture NO GROWTH 5 DAYS  Final   Report Status 02/24/2015 FINAL  Final  C difficile quick scan w PCR reflex     Status: None   Collection Time: 02/20/15  2:14 PM  Result Value Ref Range Status   C Diff antigen NEGATIVE NEGATIVE Final   C Diff toxin NEGATIVE NEGATIVE Final   C Diff interpretation Negative for toxigenic C. difficile  Final  Urine culture     Status: None   Collection Time: 02/22/15  9:48 AM  Result Value Ref Range Status   Specimen Description URINE, RANDOM  Final   Special Requests NONE  Final   Culture >=100,000 COLONIES/mL ESCHERICHIA COLI  Final   Report Status 02/24/2015 FINAL  Final   Organism ID, Bacteria ESCHERICHIA COLI  Final      Susceptibility   Escherichia coli - MIC*    AMPICILLIN <=2 SENSITIVE Sensitive     CEFAZOLIN <=4 SENSITIVE Sensitive     CEFTRIAXONE <=1 SENSITIVE Sensitive     CIPROFLOXACIN <=0.25 SENSITIVE Sensitive     GENTAMICIN <=1 SENSITIVE Sensitive     IMIPENEM <=0.25 SENSITIVE Sensitive     NITROFURANTOIN <=16 SENSITIVE Sensitive     TRIMETH/SULFA <=20 SENSITIVE Sensitive     AMPICILLIN/SULBACTAM <=2 SENSITIVE Sensitive     PIP/TAZO <=4 SENSITIVE Sensitive     * >=100,000 COLONIES/mL ESCHERICHIA COLI     Studies: Ct Tibia Fibula Right Wo Contrast  02/25/2015  CLINICAL DATA:  Right lower leg cellulitis. Evaluate for necrotizing fasciitis or abscess. No intravenous contrast due to elevated creatinine. EXAM: CT OF THE RIGHT TIBIA FIBULA WITHOUT CONTRAST TECHNIQUE: Multidetector CT imaging was performed according to the standard protocol. Multiplanar CT image reconstructions were also generated. COMPARISON:  Right ankle plain films 06/08/2013 FINDINGS: Examination demonstrates moderate diffuse subcutaneous edema from the right knee to the right ankle an oval without focal fluid collection to suggest subcutaneous abscess. No evidence of air within the soft tissues. Underlying muscle  compartments are within normal without evidence of focal fluid collections or air. There are mild-to-moderate tricompartmental osteoarthritic changes of the right knee. Minimal degenerative change of the tibiotalar joint. Chronic changes of the medial malleolus. No fracture, dislocation or focal lytic process to suggest osteomyelitis. Minimal spurring at the Achilles tendon insertion site. IMPRESSION: Moderate subcutaneous edema over the right lower leg and ankle/ foot. Findings compatible with history of cellulitis. No evidence of air within the soft tissues to suggest necrotizing fasciitis. No definite  focal fluid collection to suggest subcutaneous abscess. Normal appearance of underlying muscle compartments. Electronically Signed   By: Elberta Fortis M.D.   On: 02/25/2015 10:41    Scheduled Meds: . acetaminophen  1,000 mg Oral Once  . amLODipine  10 mg Oral Daily  . carvedilol  25 mg Oral BID WC  . cephALEXin  250 mg Oral 3 times per day  . doxycycline  100 mg Oral Q12H  . furosemide  80 mg Intravenous BID  . heparin  5,000 Units Subcutaneous 3 times per day  . insulin aspart  0-20 Units Subcutaneous TID WC  . insulin aspart  0-5 Units Subcutaneous QHS  . insulin aspart  6 Units Subcutaneous TID WC  . insulin glargine  30 Units Subcutaneous QHS  . isosorbide mononitrate  30 mg Oral Daily  . methylPREDNISolone (SOLU-MEDROL) injection  60 mg Intravenous Q12H  . potassium chloride  40 mEq Oral Daily  . sodium chloride  3 mL Intravenous Q12H   Continuous Infusions:   Principal Problem:   Acute respiratory failure with hypoxia (HCC) Active Problems:   Diabetes mellitus with hyperglycemia (HCC)   AKI (acute kidney injury) (HCC)   Chronic kidney disease (CKD), stage IV (severe) (HCC)   Cellulitis   History of stroke   Asthma exacerbation   Acute diastolic (congestive) heart failure (HCC)   Leukocytosis   E-coli UTI   Acute respiratory failure (HCC)    Time spent: 35  minutes    [facility] MD [facility] Pager [phone]. If 7PM-7AM, please contact night-coverage at [email or web address], password [facility] 02/25/2015, 6:20 PM  LOS: 6 days

## 2015-02-25 NOTE — Progress Notes (Signed)
[facility] Progress Note    Assessment/ Plan:   Background: 40 y.o. 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 Recently discharged after admission for dCHF exacerbation, creatinine increase from 2.98->3.74 with diuresis for CHF (lost 30lb) who returns now with febrile illness, cough, SOB, leg ulcer and worsening renal function - creatinine up to 5.84 and we are asked to see.    1. AKI on CKD4 - creatinine was still rising when she left the hospital 8 days ago so I don't know if this is acute or part of the insidious rise she was already experiencing as she was (very appropriately) diuresing. Sepsis could be playing a role here as well. She still has gross anasarca but her CXR is relatively clear - I wonder if she wasn't responding to the oral diuretics because of gut edema bec she has anasarca  - Restarted Lasix 80mg  twice daily IV on 1/9 and monitoring response (continue for now). Hopefully by improving her volume status her cardiac function / cardio- renal syndrome will improve as well.  - She will ultimately benefit from dialysis to optimally control her volume - I'm not sure if we can do it with diuretics and still spare renal function; she was very much against being on dialysis but will continue to broach the subject and educate her. She is not far from dialysis and likely will need dialysis to manage the volume status. Her renal function likely will worsen with the diuretics that she needs. - Started her on  KlorCon 40meq daily while she's on diuretics --> if still consistently low will need to add a 2nd dose.  - Fortunately renal function appears to have stabilized with a decrease over the past 72 hrs --> will continue to monitor. Will take up to 2 weeks to get back to baseline; lowest cr that we have is 2.98. [facility]. REsults not  back yet but labs have been drawn.  2. Febrile illness - CXR neg for PNA. R calf likely the culprit.  3. Acute resp failure - Asthma exacerbation (cardiac asthma?), possible exacerbation dCHF but CXR clear, weight down another 3 lb since discharge on 12/30. Clearly has LOTS of extra volume on board - pulm responding to nebs, steroids but still with persistent cough. 4. Grade 1 DD on ECHO 02/05/15 - unclear how much of her resp sx is asthma exac vs dCHF. See above discussion 1,2 5. Morbid obesity 6. DM2 7. HTN 8. Anemia. Hb>10. No ESA requirement as of yet 9. Reduced health literacy - this pt will require a LOT of education - very limited understanding  Subjective:   Feels breathing is about the same although she has a cough productive of only whitish sputum. She is not sure how much she is voiding but states sheets have been wet. Denies fever, chills, nausea.   Overall feels better; more frequent bowel movements.  No change from yesterday ROS.   Objective:   BP 159/78 mmHg  Pulse 74  Temp(Src) 98.8 F (37.1 C) (Oral)  Resp 20  Ht 5\' 5"  (1.651 m)  Wt 189.15 kg (417 lb)  BMI 69.39 kg/m2  SpO2 99%  Intake/Output Summary (Last 24 hours) at 02/25/15 0814 Last data filed at 02/24/15 2300  Gross per 24 hour  Intake    120 ml  Output      0  ml  Net    120 ml   Weight change: 0.75 kg (1 lb 10.5 oz)  Physical Exam: Gen: Morbidly obese AAF lying on stretcher - coughing (loose cough) and wheezing Skin: Numerous tattoos Neck: I cannot see her neck veins due to her large beefy neck Chest: Coarse crackles  Heart: regular, no rub or gallop Abdomen: soft, S1S2 No S3 or S4 Ext: 2-3++ pitting edema to the knees bilaterally.  Large denuded area R calf with redness and induration surrounding the area of skin tear.  Neuro: alert, Ox3  Imaging: No results found.  Labs: BMET  Recent Labs Lab 02/20/15 0300 02/21/15 0600 02/21/15 1630 02/22/15 0458 02/22/15 1235  02/23/15 0625 02/24/15 0552  NA 138 137 135 136 134* 135 136  K 3.4* 2.9* 2.7* 3.3* 3.5 3.6 3.4*  CL 99* 99* 98* 99* 99* 100* 102  CO2 23 24 24  21* 20* 23 23  GLUCOSE 239* 384* 366* 322* 353* 410* 274*  BUN 67* 82* 81* 87* 91* 96* 97*  CREATININE 5.83* 5.96* 5.73* 5.44* 5.35* 5.17* 4.97*  CALCIUM 8.2* 8.3* 8.8* 8.7* 8.7* 8.7* 8.6*  PHOS 4.5  --   --   --   --   --  4.3   CBC  Recent Labs Lab 02/19/15 1123  02/21/15 0600 02/22/15 0458 02/23/15 0625 02/24/15 0552  WBC 19.8*  < > 22.1* 18.3* 14.8* 16.9*  NEUTROABS 15.2*  --   --   --  13.5*  --   HGB 10.4*  < > 9.5* 9.5* 9.9* 10.4*  HCT 32.3*  < > 29.2* 28.7* 30.0* 31.8*  MCV 82.2  < > 80.0 79.5 80.6 80.7  PLT 172  < > 240 302 340 386  < > = values in this interval not displayed.  Medications:    . acetaminophen  1,000 mg Oral Once  . amLODipine  10 mg Oral Daily  . carvedilol  25 mg Oral BID WC  . cephALEXin  250 mg Oral 3 times per day  . doxycycline  100 mg Oral Q12H  . furosemide  80 mg Intravenous BID  . heparin  5,000 Units Subcutaneous 3 times per day  . insulin aspart  0-20 Units Subcutaneous TID WC  . insulin aspart  0-5 Units Subcutaneous QHS  . insulin aspart  6 Units Subcutaneous TID WC  . insulin glargine  30 Units Subcutaneous QHS  . isosorbide mononitrate  30 mg Oral Daily  . methylPREDNISolone (SOLU-MEDROL) injection  60 mg Intravenous Q12H  . potassium chloride  40 mEq Oral Daily  . sodium chloride  3 mL Intravenous Q12H      Paulene Floor, MD 02/25/2015, 8:14 AM

## 2015-02-26 DIAGNOSIS — L03116 Cellulitis of left lower limb: Secondary | ICD-10-CM

## 2015-02-26 DIAGNOSIS — N184 Chronic kidney disease, stage 4 (severe): Secondary | ICD-10-CM

## 2015-02-26 LAB — CBC
HCT: 31.3 % — ABNORMAL LOW (ref 36.0–46.0)
Hemoglobin: 10.3 g/dL — ABNORMAL LOW (ref 12.0–15.0)
MCH: 26.4 pg (ref 26.0–34.0)
MCHC: 32.9 g/dL (ref 30.0–36.0)
MCV: 80.3 fL (ref 78.0–100.0)
PLATELETS: 333 10*3/uL (ref 150–400)
RBC: 3.9 MIL/uL (ref 3.87–5.11)
RDW: 14.3 % (ref 11.5–15.5)
WBC: 14.8 10*3/uL — ABNORMAL HIGH (ref 4.0–10.5)

## 2015-02-26 LAB — GLUCOSE, CAPILLARY
GLUCOSE-CAPILLARY: 170 mg/dL — AB (ref 65–99)
GLUCOSE-CAPILLARY: 171 mg/dL — AB (ref 65–99)
Glucose-Capillary: 184 mg/dL — ABNORMAL HIGH (ref 65–99)
Glucose-Capillary: 212 mg/dL — ABNORMAL HIGH (ref 65–99)

## 2015-02-26 LAB — RENAL FUNCTION PANEL
Albumin: 1.7 g/dL — ABNORMAL LOW (ref 3.5–5.0)
Anion gap: 14 (ref 5–15)
BUN: 104 mg/dL — AB (ref 6–20)
CHLORIDE: 101 mmol/L (ref 101–111)
CO2: 22 mmol/L (ref 22–32)
CREATININE: 4.26 mg/dL — AB (ref 0.44–1.00)
Calcium: 8.5 mg/dL — ABNORMAL LOW (ref 8.9–10.3)
GFR calc Af Amer: 14 mL/min — ABNORMAL LOW (ref >60)
GFR, EST NON AFRICAN AMERICAN: 12 mL/min — AB (ref >60)
GLUCOSE: 192 mg/dL — AB (ref 65–99)
POTASSIUM: 4.3 mmol/L (ref 3.5–5.1)
Phosphorus: 5 mg/dL — ABNORMAL HIGH (ref 2.5–4.6)
Sodium: 137 mmol/L (ref 135–145)

## 2015-02-26 NOTE — Progress Notes (Signed)
[facility] Progress Note    Assessment/ Plan:   Background: 40 y.o. 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 Recently discharged after admission for dCHF exacerbation, creatinine increase from 2.98->3.74 with diuresis for CHF (lost 30lb) who returns now with febrile illness, cough, SOB, leg ulcer and worsening renal function - creatinine up to 5.84 and we were asked to see. Renal function finally improving w/ continued IV diuresis.   1. AKI on CKD4 - creatinine was still rising when she left the hospital 8 days ago so I don't know if this is acute or part of the insidious rise she was already experiencing as she was (very appropriately) diuresing. Sepsis could be playing a role here as well. She still has gross anasarca but her CXR is relatively clear - I wonder if she wasn't responding to the oral diuretics because of gut edema bec she has anasarca  - Restarted Lasix 80mg  twice daily IV on 1/9 and monitoring response (continue for now). Hopefully by improving her volume status her cardiac function / cardio- renal syndrome will improve as well.  - She will ultimately benefit from dialysis to optimally control her volume - I'm not sure if we can do it with diuretics and still spare renal function; she was very much against being on dialysis but will continue to broach the subject and educate her. She is not far from dialysis and likely will need dialysis to manage the volume status. Her renal function likely will worsen with the diuretics that she needs. - Started her on KlorCon daily while she's on diuretics --> if still consistently low will need to add a 2nd dose.  - Fortunately renal function appears to have stabilized with a decrease over the past 72 hrs --> will continue to monitor. Will take up to 2 weeks to get back to baseline; lowest cr that we  have is 2.98.  - Followed at [facility] as an outpatient --> missed appt w/ Dr. Marisue Humble bec of the hospitalization (will call office in AM to ensure she has f/u).  2. Febrile illness - CXR neg for PNA. R calf likely the culprit.  3. Acute resp failure - Asthma exacerbation (cardiac asthma?), possible exacerbation dCHF but CXR clear, weight down another 3 lb since discharge on 12/30. Clearly has LOTS of extra volume on board - pulm responding to nebs, steroids but still with persistent cough. 4. Grade 1 DD on ECHO 02/05/15 - unclear how much of her resp sx is asthma exac vs dCHF. See above discussion 1,2 5. Morbid obesity 6. DM2 7. HTN 8. Anemia. Hb>10. No ESA requirement as of yet 9. Reduced health literacy - this pt will require a LOT of education - very limited understanding  Subjective:   Feels breathing is about the same although she has a cough productive of only whitish sputum. She is not sure how much she is voiding but states sheets have been wet. Denies fever, chills, nausea.   Overall feels better; asking when she can go home.    Objective:   BP 153/84 mmHg  Pulse 72  Temp(Src) 98.1 F (36.7 C) (Oral)  Resp 18  Ht 5\' 5"  (1.651 m)  Wt 185.1 kg (408 lb 1.1 oz)  BMI 67.91 kg/m2  SpO2 100%  Intake/Output Summary (Last 24 hours) at 02/26/15 0948 Last data filed at 02/26/15 1610  Gross  per 24 hour  Intake    963 ml  Output      0 ml  Net    963 ml   Weight change: -4.05 kg (-8 lb 14.9 oz)  Physical Exam: Gen: Morbidly obese AAF lying on stretcher - coughing (loose cough) and wheezing Skin: Numerous tattoos Neck: I cannot see her neck veins due to her large beefy neck Chest: Coarse crackles  Heart: regular, no rub or gallop Abdomen: soft, S1S2 No S3 or S4 Ext: 2-3++ pitting edema to the knees bilaterally.  Large denuded area R calf with redness and induration surrounding the area of skin tear.  Neuro: alert, Ox3 GU: No foley  Imaging: Ct Tibia Fibula Right Wo  Contrast  02/25/2015  CLINICAL DATA:  Right lower leg cellulitis. Evaluate for necrotizing fasciitis or abscess. No intravenous contrast due to elevated creatinine. EXAM: CT OF THE RIGHT TIBIA FIBULA WITHOUT CONTRAST TECHNIQUE: Multidetector CT imaging was performed according to the standard protocol. Multiplanar CT image reconstructions were also generated. COMPARISON:  Right ankle plain films 06/08/2013 FINDINGS: Examination demonstrates moderate diffuse subcutaneous edema from the right knee to the right ankle an oval without focal fluid collection to suggest subcutaneous abscess. No evidence of air within the soft tissues. Underlying muscle compartments are within normal without evidence of focal fluid collections or air. There are mild-to-moderate tricompartmental osteoarthritic changes of the right knee. Minimal degenerative change of the tibiotalar joint. Chronic changes of the medial malleolus. No fracture, dislocation or focal lytic process to suggest osteomyelitis. Minimal spurring at the Achilles tendon insertion site. IMPRESSION: Moderate subcutaneous edema over the right lower leg and ankle/ foot. Findings compatible with history of cellulitis. No evidence of air within the soft tissues to suggest necrotizing fasciitis. No definite focal fluid collection to suggest subcutaneous abscess. Normal appearance of underlying muscle compartments. Electronically Signed   By: Elberta Fortis M.D.   On: 02/25/2015 10:41    Labs: BMET  Recent Labs Lab 02/20/15 0300 02/21/15 0600 02/21/15 1630 02/22/15 0458 02/22/15 1235 02/23/15 0625 02/24/15 0552 02/25/15 0541  NA 138 137 135 136 134* 135 136 137  K 3.4* 2.9* 2.7* 3.3* 3.5 3.6 3.4* 3.9  CL 99* 99* 98* 99* 99* 100* 102 105  CO2 23 24 24  21* 20* 23 23 21*  GLUCOSE 239* 384* 366* 322* 353* 410* 274* 185*  BUN 67* 82* 81* 87* 91* 96* 97* 100*  CREATININE 5.83* 5.96* 5.73* 5.44* 5.35* 5.17* 4.97* 4.57*  CALCIUM 8.2* 8.3* 8.8* 8.7* 8.7* 8.7* 8.6*  8.6*  PHOS 4.5  --   --   --   --   --  4.3 4.6   CBC  Recent Labs Lab 02/19/15 1123  02/23/15 0625 02/24/15 0552 02/25/15 0541 02/26/15 0514  WBC 19.8*  < > 14.8* 16.9* 14.1* 14.8*  NEUTROABS 15.2*  --  13.5*  --   --   --   HGB 10.4*  < > 9.9* 10.4* 11.3* 10.3*  HCT 32.3*  < > 30.0* 31.8* 33.4* 31.3*  MCV 82.2  < > 80.6 80.7 80.3 80.3  PLT 172  < > 340 386 349 333  < > = values in this interval not displayed.  Medications:    . acetaminophen  1,000 mg Oral Once  . amLODipine  10 mg Oral Daily  . carvedilol  25 mg Oral BID WC  . cephALEXin  250 mg Oral 3 times per day  . doxycycline  100 mg Oral Q12H  . furosemide  80 mg Intravenous BID  . heparin  5,000 Units Subcutaneous 3 times per day  . insulin aspart  0-20 Units Subcutaneous TID WC  . insulin aspart  0-5 Units Subcutaneous QHS  . insulin aspart  6 Units Subcutaneous TID WC  . insulin glargine  30 Units Subcutaneous QHS  . isosorbide mononitrate  30 mg Oral Daily  . potassium chloride  40 mEq Oral Daily  . predniSONE  60 mg Oral QAC breakfast  . sodium chloride  3 mL Intravenous Q12H      Paulene FloorJames Lin, MD 02/26/2015, 9:48 AM

## 2015-02-26 NOTE — Progress Notes (Signed)
[facility] PROGRESS NOTE  [name] ZDG:[id number] DOB: 11/01/1975 DOA: 02/19/2015 PCP: Lonia Blood, MD  Brief interval history [name] is a 40 year old female with a past medical history of morbid obesity having multiple comorbidities including congestive heart failure, hypertension, diabetes mellitus, stage IV chronic kidney disease presented to the emergency department with complaints of generalized weakness, fatigue, minimal by mouth intake and having overall functional decline.Marland Kitchen She was recently discharged from the medicine service on 02/10/2015 at which time she was treated for acute on chronic diastolic congestive heart failure and acute on chronic renal failure. Lab work performed in the emergency department showed upward trend in her creatinine from 3.74 on 02/10/2015 to 5.84 on presentation. She was seen and evaluated by nephrology. Lasix was held. She was also found to have a skin tear in the right calf region.   Assessment/Plan: 1. Acute on chronic renal failure -Patient having a history of stage IV chronic kidney disease, recently hospitalized for acute on chronic renal failure, on 02/10/2015 had a creatinine of 3.74 with BUN of 30. She presented with generalized weakness and fatigue, with labs showing creatinine of 5.84 with BUN of 58. Patient likely worsening renal failure and may need hemodialysis. -She was seen and evaluated by nephrology recommending holding Lasix therapy, which she had been taking 120 mg twice daily. Patient has been started on IV Lasix per nephrology now at 80 every 12 hours as patient may have not responded to oral Lasix secondary to gut edema. Monitor output. Follow renal function. -On recent hospitalization had extensive workup for renal failure which was unrevealing. -I suspect hemodialysis is a future intervention although it appears patient agreeable to this at the time being. -Repeat labs on 02/21/2015 showing upward trend in  creatinine 5.83>5.96. Her lasix was restarted on 02/20/2015 at 80 mg IV BID and creatinine today is trending back down and currently at 4.26. Per nephrology.   2. Right calf cellulitis -She was found to have a 8 cm x 4 cm skin tear in the right calf with some associated erythema. Labs revealed a white count of 19,800 having a temperature of 101.5 raising concern for infection -She was started on empiric IV antibiotic therapy with vancomycin  -White count fluctuating and currently at 14.8 from 22.1 on admission. Patient  has been afebrile for the past 72 hours. WBC trending back down likely a component of steroids. Patient has been seen by general surgery who feel no surgical intervention is needed at this time. Continue current wound care dressing changes. Continue oral doxycycline and Keflex. CT tib/ fib negative for abscess.  3. Acute respiratory failure -Evidenced by a respiratory rate of 25 having an O2 sat of 89% on 2 L supplemental oxygen. -Likely multifactorial secondary to asthma exacerbation and acute diastolic heart failure and worsening renal function and obesity hypoventilation syndrome. Patient with clinical improvement. Continue IV Lasix, nebulizer treatments, oral steriod taper.  4. History of diastolic congestive heart failure -Last transthoracic echocardiogram was performed on 02/05/2015 that showed a preserved ejection fraction with grade 1 diastolic dysfunction. -As mentioned above due to acute on chronic renal failure her Lasix will be held. She was previously taking 120 mg by mouth twice a day. -Lasix was restarted on 02/20/2015 at 80 mg IV BID by nephrology. Watching kidney function. Suspect will likely need HD.  -Will closely monitor volume status.  5. Insulin-dependent diabetes mellitus. -Hyperglycemia likely secondary to steroids. CBGs have ranged from 119-237. -Continue Lantus 30 units [place] q daily and continue resistant  SSI. Monitor CBGs with steroid taper.  6.  Hypertension. -Blood pressures remain elevated with systolic blood pressures in the 130s to 150s -Continue Coreg 25 mg by mouth twice a day, IV Lasix,IMDUR, Norvasc.  7. Hypokalemia -Repleted.   8.  Escherichia coli UTI Continue oral Keflex to complete course of treatment.   Code Status: Full Family Communication: Updated patient and family at bedside. Disposition Plan: Home once volume overload has been corrected and renal function has improved and patient on oral diuretics.   Consultants:  Nephrology: Dr. Eliott Nineunham 02/19/2015  Gen. surgery Dr. Derrell Lollingamirez 02/21/2015  Procedures:  Chest x-ray 02/19/2015, 02/20/2015  CT tib fib 02/25/2015  Antibiotics:  IV Rocephin 02/22/2015>>>> 02/24/2015  IV vancomycin 02/19/2015>>> 02/24/2015  Oral doxycycline 02/24/2015  Oral Keflex 02/24/2015  HPI/Subjective: Sleepy however easily arousable. Patient denies SOB. No CP. Anxious to go home.  Objective: Filed Vitals:   02/25/15 2259 02/26/15 0854  BP: 133/73 153/84  Pulse: 78 72  Temp: 98.2 F (36.8 C) 98.1 F (36.7 C)  Resp: 17 18    Intake/Output Summary (Last 24 hours) at 02/26/15 1442 Last data filed at 02/26/15 1402  Gross per 24 hour  Intake   1200 ml  Output      0 ml  Net   1200 ml   Filed Weights   02/25/15 0553 02/25/15 2259 02/26/15 0300  Weight: 189.15 kg (417 lb) 185.1 kg (408 lb 1.1 oz) 185.1 kg (408 lb 1.1 oz)    Exam:   General:  NAD  Cardiovascular: RRR. Distant heart sounds secondary to body habitus.  Respiratory: CTAB.  Abdomen: Obese, soft, nontender, nondistended, positive bowel sounds.  Musculoskeletal: No clubbing cyanosis. 1-2+ bilateral lower extremity edema. Large denuded area right calf with less redness and induration, foul odor.  Data Reviewed: Basic Metabolic Panel:  Recent Labs Lab 02/20/15 0300  02/22/15 1235 02/23/15 0625 02/24/15 0552 02/25/15 0541 02/26/15 1030  NA 138  < > 134* 135 136 137 137  K 3.4*  < > 3.5 3.6  3.4* 3.9 4.3  CL 99*  < > 99* 100* 102 105 101  CO2 23  < > 20* 23 23 21* 22  GLUCOSE 239*  < > 353* 410* 274* 185* 192*  BUN 67*  < > 91* 96* 97* 100* 104*  CREATININE 5.83*  < > 5.35* 5.17* 4.97* 4.57* 4.26*  CALCIUM 8.2*  < > 8.7* 8.7* 8.6* 8.6* 8.5*  PHOS 4.5  --   --   --  4.3 4.6 5.0*  < > = values in this interval not displayed. Liver Function Tests:  Recent Labs Lab 02/20/15 0300 02/24/15 0552 02/25/15 0541 02/26/15 1030  ALBUMIN 2.0* 1.9* 1.9* 1.7*   No results for input(s): LIPASE, AMYLASE in the last 168 hours. No results for input(s): AMMONIA in the last 168 hours. CBC:  Recent Labs Lab 02/22/15 0458 02/23/15 0625 02/24/15 0552 02/25/15 0541 02/26/15 0514  WBC 18.3* 14.8* 16.9* 14.1* 14.8*  NEUTROABS  --  13.5*  --   --   --   HGB 9.5* 9.9* 10.4* 11.3* 10.3*  HCT 28.7* 30.0* 31.8* 33.4* 31.3*  MCV 79.5 80.6 80.7 80.3 80.3  PLT 302 340 386 349 333   Cardiac Enzymes: No results for input(s): CKTOTAL, CKMB, CKMBINDEX, TROPONINI in the last 168 hours. BNP (last 3 results)  Recent Labs  02/04/15 0031 02/19/15 1123  BNP 349.2* 921.9*    ProBNP (last 3 results) No results for input(s): PROBNP in the last 8760  hours.  CBG:  Recent Labs Lab 02/25/15 1201 02/25/15 1654 02/25/15 2231 02/26/15 0729 02/26/15 1124  GLUCAP 199* 213* 186* 170* 171*    Recent Results (from the past 240 hour(s))  Culture, blood (routine x 2)     Status: None   Collection Time: 02/19/15 12:53 PM  Result Value Ref Range Status   Specimen Description BLOOD RIGHT FOREARM  Final   Special Requests BOTTLES DRAWN AEROBIC AND ANAEROBIC  Final   Culture NO GROWTH 5 DAYS  Final   Report Status 02/24/2015 FINAL  Final  Culture, blood (routine x 2)     Status: None   Collection Time: 02/19/15  1:00 PM  Result Value Ref Range Status   Specimen Description BLOOD LEFT ANTECUBITAL  Final   Special Requests BOTTLES DRAWN AEROBIC AND ANAEROBIC  Final   Culture NO GROWTH  5 DAYS  Final   Report Status 02/24/2015 FINAL  Final  C difficile quick scan w PCR reflex     Status: None   Collection Time: 02/20/15  2:14 PM  Result Value Ref Range Status   C Diff antigen NEGATIVE NEGATIVE Final   C Diff toxin NEGATIVE NEGATIVE Final   C Diff interpretation Negative for toxigenic C. difficile  Final  Urine culture     Status: None   Collection Time: 02/22/15  9:48 AM  Result Value Ref Range Status   Specimen Description URINE, RANDOM  Final   Special Requests NONE  Final   Culture >=100,000 COLONIES/mL ESCHERICHIA COLI  Final   Report Status 02/24/2015 FINAL  Final   Organism ID, Bacteria ESCHERICHIA COLI  Final      Susceptibility   Escherichia coli - MIC*    AMPICILLIN <=2 SENSITIVE Sensitive     CEFAZOLIN <=4 SENSITIVE Sensitive     CEFTRIAXONE <=1 SENSITIVE Sensitive     CIPROFLOXACIN <=0.25 SENSITIVE Sensitive     GENTAMICIN <=1 SENSITIVE Sensitive     IMIPENEM <=0.25 SENSITIVE Sensitive     NITROFURANTOIN <=16 SENSITIVE Sensitive     TRIMETH/SULFA <=20 SENSITIVE Sensitive     AMPICILLIN/SULBACTAM <=2 SENSITIVE Sensitive     PIP/TAZO <=4 SENSITIVE Sensitive     * >=100,000 COLONIES/mL ESCHERICHIA COLI     Studies: Ct Tibia Fibula Right Wo Contrast  02/25/2015  CLINICAL DATA:  Right lower leg cellulitis. Evaluate for necrotizing fasciitis or abscess. No intravenous contrast due to elevated creatinine. EXAM: CT OF THE RIGHT TIBIA FIBULA WITHOUT CONTRAST TECHNIQUE: Multidetector CT imaging was performed according to the standard protocol. Multiplanar CT image reconstructions were also generated. COMPARISON:  Right ankle plain films 06/08/2013 FINDINGS: Examination demonstrates moderate diffuse subcutaneous edema from the right knee to the right ankle an oval without focal fluid collection to suggest subcutaneous abscess. No evidence of air within the soft tissues. Underlying muscle compartments are within normal without evidence of focal fluid collections or  air. There are mild-to-moderate tricompartmental osteoarthritic changes of the right knee. Minimal degenerative change of the tibiotalar joint. Chronic changes of the medial malleolus. No fracture, dislocation or focal lytic process to suggest osteomyelitis. Minimal spurring at the Achilles tendon insertion site. IMPRESSION: Moderate subcutaneous edema over the right lower leg and ankle/ foot. Findings compatible with history of cellulitis. No evidence of air within the soft tissues to suggest necrotizing fasciitis. No definite focal fluid collection to suggest subcutaneous abscess. Normal appearance of underlying muscle compartments. Electronically Signed   By: Elberta Fortis M.D.   On: 02/25/2015 10:41  Scheduled Meds: . acetaminophen  1,000 mg Oral Once  . amLODipine  10 mg Oral Daily  . carvedilol  25 mg Oral BID WC  . cephALEXin  250 mg Oral 3 times per day  . doxycycline  100 mg Oral Q12H  . furosemide  80 mg Intravenous BID  . heparin  5,000 Units Subcutaneous 3 times per day  . insulin aspart  0-20 Units Subcutaneous TID WC  . insulin aspart  0-5 Units Subcutaneous QHS  . insulin aspart  6 Units Subcutaneous TID WC  . insulin glargine  30 Units Subcutaneous QHS  . isosorbide mononitrate  30 mg Oral Daily  . potassium chloride  40 mEq Oral Daily  . predniSONE  60 mg Oral QAC breakfast  . sodium chloride  3 mL Intravenous Q12H   Continuous Infusions:   Principal Problem:   Acute respiratory failure with hypoxia (HCC) Active Problems:   Diabetes mellitus with hyperglycemia (HCC)   AKI (acute kidney injury) (HCC)   Chronic kidney disease (CKD), stage IV (severe) (HCC)   Cellulitis   History of stroke   Asthma exacerbation   Acute diastolic (congestive) heart failure (HCC)   Leukocytosis   E-coli UTI   Acute respiratory failure (HCC)    Time spent: 35 minutes    [facility] MD [facility] Pager [phone]. If 7PM-7AM, please contact night-coverage at  [email or web address], password [facility] 02/26/2015, 2:42 PM  LOS: 7 days

## 2015-02-27 DIAGNOSIS — E119 Type 2 diabetes mellitus without complications: Secondary | ICD-10-CM

## 2015-02-27 LAB — CBC
HCT: 30.2 % — ABNORMAL LOW (ref 36.0–46.0)
Hemoglobin: 9.8 g/dL — ABNORMAL LOW (ref 12.0–15.0)
MCH: 26 pg (ref 26.0–34.0)
MCHC: 32.5 g/dL (ref 30.0–36.0)
MCV: 80.1 fL (ref 78.0–100.0)
PLATELETS: 327 10*3/uL (ref 150–400)
RBC: 3.77 MIL/uL — ABNORMAL LOW (ref 3.87–5.11)
RDW: 14.1 % (ref 11.5–15.5)
WBC: 12.6 10*3/uL — AB (ref 4.0–10.5)

## 2015-02-27 LAB — RENAL FUNCTION PANEL
Albumin: 1.9 g/dL — ABNORMAL LOW (ref 3.5–5.0)
Anion gap: 9 (ref 5–15)
BUN: 108 mg/dL — AB (ref 6–20)
CO2: 24 mmol/L (ref 22–32)
CREATININE: 4.17 mg/dL — AB (ref 0.44–1.00)
Calcium: 8.5 mg/dL — ABNORMAL LOW (ref 8.9–10.3)
Chloride: 105 mmol/L (ref 101–111)
GFR calc Af Amer: 15 mL/min — ABNORMAL LOW (ref >60)
GFR, EST NON AFRICAN AMERICAN: 13 mL/min — AB (ref >60)
Glucose, Bld: 229 mg/dL — ABNORMAL HIGH (ref 65–99)
Phosphorus: 5.1 mg/dL — ABNORMAL HIGH (ref 2.5–4.6)
Potassium: 4.6 mmol/L (ref 3.5–5.1)
Sodium: 138 mmol/L (ref 135–145)

## 2015-02-27 LAB — GLUCOSE, CAPILLARY
GLUCOSE-CAPILLARY: 197 mg/dL — AB (ref 65–99)
GLUCOSE-CAPILLARY: 200 mg/dL — AB (ref 65–99)

## 2015-02-27 MED ORDER — FUROSEMIDE 80 MG PO TABS
160.0000 mg | ORAL_TABLET | Freq: Two times a day (BID) | ORAL | Status: DC
  Administered 2015-02-27: 160 mg via ORAL
  Filled 2015-02-27 (×2): qty 2

## 2015-02-27 MED ORDER — DOXYCYCLINE HYCLATE 100 MG PO TABS: 100.0000 mg | ORAL_TABLET | Freq: Two times a day (BID) | ORAL | Status: DC

## 2015-02-27 MED ORDER — SODIUM CHLORIDE 0.9 % IV SOLN
510.0000 mg | Freq: Once | INTRAVENOUS | Status: AC
  Administered 2015-02-27: 510 mg via INTRAVENOUS
  Filled 2015-02-27: qty 17

## 2015-02-27 MED ORDER — CEPHALEXIN 250 MG PO CAPS: 250.0000 mg | ORAL_CAPSULE | Freq: Three times a day (TID) | ORAL | Status: DC

## 2015-02-27 MED ORDER — AMLODIPINE BESYLATE 10 MG PO TABS: 10.0000 mg | ORAL_TABLET | Freq: Every day | ORAL | Status: DC

## 2015-02-27 MED ORDER — FUROSEMIDE 80 MG PO TABS
160.0000 mg | ORAL_TABLET | Freq: Two times a day (BID) | ORAL | Status: DC
Start: 2015-02-27 — End: 2015-12-15

## 2015-02-27 MED ORDER — ALBUTEROL SULFATE (2.5 MG/3ML) 0.083% IN NEBU: 2.5000 mg | INHALATION_SOLUTION | Freq: Four times a day (QID) | RESPIRATORY_TRACT | Status: DC | PRN

## 2015-02-27 MED ORDER — PREDNISONE 20 MG PO TABS: 20.0000 mg | ORAL_TABLET | Freq: Every day | ORAL | Status: DC

## 2015-02-27 MED ORDER — ISOSORBIDE MONONITRATE ER 30 MG PO TB24: 30.0000 mg | ORAL_TABLET | Freq: Every day | ORAL | Status: DC

## 2015-02-27 MED ORDER — POTASSIUM CHLORIDE CRYS ER 20 MEQ PO TBCR: 40.0000 meq | EXTENDED_RELEASE_TABLET | Freq: Every day | ORAL | Status: DC

## 2015-02-27 MED ORDER — INSULIN GLARGINE 100 UNIT/ML ~~LOC~~ SOLN: 30.0000 [IU] | Freq: Every day | SUBCUTANEOUS | Status: DC

## 2015-02-27 NOTE — Progress Notes (Signed)
Patient to be discharged to home with family. Discharge instructions and medications reviewed with patient and patient's family. HH to be arranged by CM. [facility] Patient and patient's family declined to have RLE dressing changed prior to d/c; stated [facility] would [facility] at their house tomorrow to show them.  Leanna BattlesEckelmann, Jemaine Prokop Eileen, RN.

## 2015-02-27 NOTE — Evaluation (Signed)
Physical Therapy Evaluation Patient Details Name: [name] MRN: [id number] DOB: 07/07/1975 Today's Date: 02/27/2015   History of Present Illness  40 y.o. year-old AAF with a background of morbid obesity, HTN, DM2, dCHF CKD 4. Recently discharged after admission for dCHF exacerbation, creatinine increase with diuresis for CHF who returns now with febrile illness, cough, SOB, leg ulcer and worsening renal function  Clinical Impression  Pt presents with the above health history. Recently was seen by PT in the hospital on prior admission and recommended HHPT. Recommendation remains the same as patient is severely limited by weakness and fatigue due to deconditioning. Pt's family takes extensive care of pt while at home. Requiring Min-Mod A x 2 for transfers and mobility due to safety and equipment handling. Pt tolerated ambulating about 30 ft in the hallway before becoming increasingly fatigued. Pt has a flight of stairs to enter home but family feels they are capable to take care of her with HHPT upon discharge.    Follow Up Recommendations Home health PT;Supervision/Assistance - 24 hour    Equipment Recommendations  Rolling walker with 5" wheels (Bariatric*)    Recommendations for Other Services       Precautions / Restrictions Precautions Precautions: Fall (Obesity) Restrictions Weight Bearing Restrictions: No      Mobility  Bed Mobility Overal bed mobility: Needs Assistance;+2 for physical assistance Bed Mobility: Supine to Sit     Supine to sit: Mod assist;+2 for physical assistance     General bed mobility comments: Mod A x2 for pt to pull trunk upright. Able to bring LEs towards EOB but kept sliding back on air matress. Cues for techniques and assist from husband  Transfers Overall transfer level: Needs assistance Equipment used: Rolling walker (2 wheeled) Transfers: Sit to/from Stand Sit to Stand: Min assist;+2 safety/equipment         General transfer  comment: min A from bed to standing with RW. Min A but Max VCs to sit in chair after walking  Ambulation/Gait Ambulation/Gait assistance: Min assist;+2 safety/equipment Ambulation Distance (Feet): 30 Feet Assistive device: Rolling walker (2 wheeled) Gait Pattern/deviations: Step-through pattern;Decreased stride length;Drifts right/left;Wide base of support   Gait velocity interpretation: Below normal speed for age/gender General Gait Details: Min A for walker management quickly fatigued once out of room. Follow with chair for pt to sit.   Stairs         General stair comments: pt sposue adamantly refused for patient to attempt stairs  Wheelchair Mobility    Modified Rankin (Stroke Patients Only)       Balance Overall balance assessment: Needs assistance Sitting-balance support: Bilateral upper extremity supported;Feet unsupported Sitting balance-Leahy Scale: Fair     Standing balance support: Bilateral upper extremity supported Standing balance-Leahy Scale: Fair Standing balance comment: B UE on RW for support, unsteady on feet upon initially rising from bed which improved with walking                             Pertinent Vitals/Pain Pain Assessment: No/denies pain    Home Living Family/patient expects to be discharged to:: Private residence Living Arrangements: Spouse/significant other;Children Available Help at Discharge: Family;Available 24 hours/day Type of Home: Apartment Home Access: Stairs to enter Entrance Stairs-Rails: Right Entrance Stairs-Number of Steps: flight Home Layout: Two level Home Equipment: Bedside commode;Walker - 2 wheels Additional Comments: Pt has 5, 3, and 2y.o. children     Prior Function Level of Independence: Needs assistance  Gait / Transfers Assistance Needed: Walks short distances in home, uses wheelchair typically.  ADL's / Homemaking Assistance Needed: Husband fully bathes pt and assists with bathing as well as  toileting.  Sister also assists with care as well as child care, grocery shopping, etc.         Hand Dominance   Dominant Hand: Right    Extremity/Trunk Assessment   Upper Extremity Assessment: Generalized weakness           Lower Extremity Assessment: Generalized weakness         Communication   Communication: No difficulties  Cognition Arousal/Alertness: Awake/alert Behavior During Therapy: WFL for tasks assessed/performed;Flat affect Overall Cognitive Status: Within Functional Limits for tasks assessed                      General Comments General comments (skin integrity, edema, etc.): husband and sister in room. Husband and sister both take extensive care of pt and know her needs. Sister upset that it took so long to have PT come see sister this admission. Stated that HHPT never showed up after last hospital admission    Exercises        Assessment/Plan    PT Assessment Patient needs continued PT services  PT Diagnosis Difficulty walking;Abnormality of gait;Generalized weakness   PT Problem List Decreased strength;Decreased range of motion;Decreased activity tolerance;Decreased balance;Decreased mobility;Decreased coordination;Decreased cognition;Decreased knowledge of use of DME;Obesity;Cardiopulmonary status limiting activity  PT Treatment Interventions DME instruction;Gait training;Stair training;Functional mobility training;Therapeutic activities;Therapeutic exercise;Balance training;Patient/family education   PT Goals (Current goals can be found in the Care Plan section) Acute Rehab PT Goals Patient Stated Goal: Get around better PT Goal Formulation: With patient Time For Goal Achievement: 03/13/15 Potential to Achieve Goals: Fair    Frequency Min 3X/week   Barriers to discharge Inaccessible home environment full flight of stairs to enter home    Co-evaluation               End of Session Equipment Utilized During Treatment: Gait  belt Activity Tolerance: Patient limited by fatigue Patient left: in chair;with call bell/phone within reach;with family/visitor present Nurse Communication: Mobility status         Time: [id number] PT Time Calculation (min) (ACUTE ONLY): 34 min   Charges:   PT Evaluation $PT Eval Moderate Complexity: 1 Procedure PT Treatments $Gait Training: 8-22 mins   PT G Codes:        Ulyses JarredRebekah Daelen Belvedere 02/27/2015, 1:58 PM  Ulyses Jarredebekah Alizza Sacra, Student Physical Therapist Acute Rehab [phone]

## 2015-02-27 NOTE — Progress Notes (Signed)
Patient's spouse and family member at bedside. Patient's spouse informed me that [facility] patient, stating "You only have to come here to give her medicine, check her vitals and sugars." I informed them that she is a high fall risk and only staff should be getting her out of bed, but they were insistent that they could manage. Will continue to re-educate throughout the day.   Leanna BattlesEckelmann, Clester Chlebowski Eileen, RN.

## 2015-02-27 NOTE — Care Management Note (Signed)
Case Management Note  Patient Details  Name: [name] MRN: [id number] Date of Birth: [birth year]  Subjective/Objective:              CM following for progression and d/c planning.      Action/Plan:  Met with pt and husband, pt requesting [facility] services as last agency did not come to her home. This CM called her last agency [facility] and per that agency they were unable to contact the pt. This CM changed agency per pt request and provided [facility] with another phone number. Pt requesting [facility] 3:1 for home use. [facility] eval for this 3:1 unclear when pt received last 3:1.   Expected Discharge Date:       02/27/15           Expected Discharge Plan:  [place]  In-House Referral:  NA  Discharge planning Services  CM Consult  Post Acute Care Choice:  Durable Medical Equipment, Home Health Choice offered to:  Patient  DME Arranged:  3-N-1 DME Agency:  [place]:  RN, PT [facility] Agency:  [place]  Status of Service:  Completed, signed off  Medicare Important Message Given:    Date Medicare IM Given:    Medicare IM give by:    Date Additional Medicare IM Given:    Additional Medicare Important Message give by:     If discussed at [place] of Stay Meetings, dates discussed:    Additional Comments:  Adron Bene, RN 02/27/2015, 3:18 PM

## 2015-02-27 NOTE — Progress Notes (Signed)
Patient was receiving 80 mg IV Lasix, but it was changed to 160 mg PO Lasix by Dr. Darrick Pennaeterding. Patient just received a dose of IV Lasix at 0839 this morning and first dose of PO Lasix is scheduled for 1000. Informed Dr. Darrick Pennaeterding who stated it was ok to give first dose at 1300 and then second dose around 1800 this evening. Will monitor.  Leanna BattlesEckelmann, [employer] Eileen, RN.

## 2015-02-27 NOTE — Progress Notes (Signed)
Subjective: Interval History: has no complaint, breathing ok, making lots of urine..  Objective: Vital signs in last 24 hours: Temp:  [97.9 F (36.6 C)-100.2 F (37.9 C)] 98.4 F (36.9 C) (01/16 0909) Pulse Rate:  [73-82] 82 (01/16 0909) Resp:  [18] 18 (01/16 0909) BP: (136-174)/(68-83) 174/74 mmHg (01/16 0909) SpO2:  [95 %-100 %] 96 % (01/16 0909) Weight:  [186.4 kg (410 lb 15 oz)] 186.4 kg (410 lb 15 oz) (01/15 2145) Weight change: 1.3 kg (2 lb 13.9 oz)  Intake/Output from previous day: 01/15 0701 - 01/16 0700 In: 1080 [P.O.:1080] Out: 400 [Urine:400] Intake/Output this shift:    General appearance: alert, cooperative and morbidly obese Resp: diminished breath sounds bilaterally Cardio: S1, S2 normal and systolic murmur: holosystolic 2/6, blowing at apex GI: massive, pos bs, soft Extremities: edema 2+  Lab Results:  Recent Labs  02/26/15 0514 02/27/15 0615  WBC 14.8* 12.6*  HGB 10.3* 9.8*  HCT 31.3* 30.2*  PLT 333 327   BMET:  Recent Labs  02/26/15 1030 02/27/15 0615  NA 137 138  K 4.3 4.6  CL 101 105  CO2 22 24  GLUCOSE 192* 229*  BUN 104* 108*  CREATININE 4.26* 4.17*  CALCIUM 8.5* 8.5*   No results for input(s): PTH in the last 72 hours. Iron Studies: No results for input(s): IRON, TIBC, TRANSFERRIN, FERRITIN in the last 72 hours.  Studies/Results: Ct Tibia Fibula Right Wo Contrast  02/25/2015  CLINICAL DATA:  Right lower leg cellulitis. Evaluate for necrotizing fasciitis or abscess. No intravenous contrast due to elevated creatinine. EXAM: CT OF THE RIGHT TIBIA FIBULA WITHOUT CONTRAST TECHNIQUE: Multidetector CT imaging was performed according to the standard protocol. Multiplanar CT image reconstructions were also generated. COMPARISON:  Right ankle plain films 06/08/2013 FINDINGS: Examination demonstrates moderate diffuse subcutaneous edema from the right knee to the right ankle an oval without focal fluid collection to suggest subcutaneous abscess.  No evidence of air within the soft tissues. Underlying muscle compartments are within normal without evidence of focal fluid collections or air. There are mild-to-moderate tricompartmental osteoarthritic changes of the right knee. Minimal degenerative change of the tibiotalar joint. Chronic changes of the medial malleolus. No fracture, dislocation or focal lytic process to suggest osteomyelitis. Minimal spurring at the Achilles tendon insertion site. IMPRESSION: Moderate subcutaneous edema over the right lower leg and ankle/ foot. Findings compatible with history of cellulitis. No evidence of air within the soft tissues to suggest necrotizing fasciitis. No definite focal fluid collection to suggest subcutaneous abscess. Normal appearance of underlying muscle compartments. Electronically Signed   By: Elberta Fortisaniel  Boyle M.D.   On: 02/25/2015 10:41    I have reviewed the patient's current medications.  Assessment/Plan: 1 CKD 4, AKI improving. Diuesing, change to po Lasix 2 HTN controlled 3 Anemia Fe decreased, bolus 4 HPTH vit D 5 Massive obesity 6 CVA 7 DM controlled P po lasix, Fe, can d/c to outpatient f/u anytime from my standpoint    LOS: 8 days   [name] 02/27/2015,9:51 AM

## 2015-02-27 NOTE — Progress Notes (Signed)
Occupational Therapy Evaluation Patient Details Name: [name] MRN: [id number] DOB: 09/26/1975 Today's Date: 02/27/2015    History of Present Illness 40 y.o. year-old AAF with a background of morbid obesity, HTN, DM2, dCHF CKD 4. Recently discharged after admission for dCHF exacerbation, creatinine increase with diuresis for CHF who returns now with febrile illness, cough, SOB, leg ulcer and worsening renal function   Clinical Impression   PTA, pt required assistance for ADL and uses a w/c for mobility within the apt. Feel pt could be more independent with self care. [facility] increase her independence with ADL and funcitonal mobility for ADL and help establish healthy living choices to reduce likelihood of hospital readmissions.  Pt and family discussing how difficult it is for her to climb the stairs. Pt would benefit from living in a first floor apt to reduce risk of falls and increase assess to community. All further OT needs to be addressed by [facility] as appropriate.     Follow Up Recommendations  Home health OT;Supervision/Assistance - 24 hour (initially)    Equipment Recommendations  Other (comment) (adaptive equipment for ADL )    Recommendations for Other Services       Precautions / Restrictions Precautions Precautions: Fall (Obesity) Restrictions Weight Bearing Restrictions: No      Mobility Bed Mobility               General bed mobility comments: Pt OOB in chair  Transfers                 General transfer comment: pt declined to get out of chair to walk again.    Balance     Sitting balance-Leahy Scale: Fair       Standing balance-Leahy Scale: Fair                              ADL                                         General ADL Comments: Pt feels she is at baseline with ADL.Husband assists pt as needed. Educated pt on availability of AE to increase independence with ADL,  however, pt's husband stated "that my job, I do that for her". Pt currently lives in 2nd floor apt and is requesting to live on first floor due to difficulty climbing stairs. Attempted to assess ability to climb stairs and monitor HR adn O2 Sats, however, pt declined and stated she would talk to her MD about it. Pt does have a power w/c, however is unable to use it in the community due to inaccessibility of apt. Pt would benefit from first floor apt.     Vision     Perception     Praxis      Pertinent Vitals/Pain Pain Assessment: No/denies pain     Hand Dominance Right   Extremity/Trunk Assessment Upper Extremity Assessment Upper Extremity Assessment: Generalized weakness   Lower Extremity Assessment Lower Extremity Assessment: Defer to PT evaluation       Communication Communication Communication: No difficulties   Cognition Arousal/Alertness: Awake/alert Behavior During Therapy: WFL for tasks assessed/performed;Flat affect Overall Cognitive Status: Within Functional Limits for tasks assessed                     General Comments  Exercises       Shoulder Instructions      Home Living Family/patient expects to be discharged to:: Private residence Living Arrangements: Spouse/significant other;Children Available Help at Discharge: Family;Available 24 hours/day Type of Home: Apartment Home Access: Stairs to enter Entrance Stairs-Number of Steps: flight Entrance Stairs-Rails: Right Home Layout: Two level Alternate Level Stairs-Number of Steps: lives on 2nd floor, full flight to get there   Bathroom Shower/Tub: Chief Strategy Officer: Standard     Home Equipment: Bedside commode;Walker - 2 wheels;Wheelchair - Engineer, technical sales - power;Cane - quad;Shower seat   Additional Comments: Pt has 5, 3, and 2y.o. children       Prior Functioning/Environment Level of Independence: Needs assistance  Gait / Transfers Assistance Needed: Walks short  distances in home, uses wheelchair typically. ADL's / Homemaking Assistance Needed: Husband fully bathes pt and assists with bathing as well as toileting.  Sister also assists with care as well as child care, grocery shopping, etc.         OT Diagnosis: Generalized weakness   OT Problem List: Decreased strength;Decreased activity tolerance;Decreased knowledge of use of DME or AE;Obesity;Cardiopulmonary status limiting activity   OT Treatment/Interventions:      OT Goals(Current goals can be found in the care plan section) Acute Rehab OT Goals Patient Stated Goal: to go home OT Goal Formulation: All assessment and education complete, DC therapy  OT Frequency:     Barriers to D/C:            Co-evaluation              End of Session Nurse Communication: Other (comment) (request for first floor apt)  Activity Tolerance: Patient tolerated treatment well Patient left: in chair;with call bell/phone within reach;with family/visitor present   Time: [id number] OT Time Calculation (min): 13 min Charges:  OT General Charges $OT Visit: 1 Procedure OT Evaluation $OT Eval Low Complexity: 1 Procedure G-Codes:    Cristol Engdahl,HILLARY 03-29-15, 3:44 PM   [facility], OTR/L  [phone] 03/29/2015

## 2015-02-27 NOTE — Discharge Summary (Signed)
Physician Discharge Summary  Linda SeatDavenia [name] NWG:[id number]:[id number] DOB: 02/05/1976 DOA: 02/19/2015  PCP: Linda BloodGARBA,LAWAL, [name]  Admit date: 02/19/2015 Discharge date: 02/27/2015  Time spent: 70 minutes  Recommendations for Outpatient Follow-up:  1. Follow-up with Dr. Marisue [name], nephrology 2 weeks. 2. Follow-up at nephrology office in one week for lab work. 3. Follow-up with [name] in 1-2 weeks. On follow-up patient's right lower extremity cellulitis/wound will need to be reassessed. Patient's [name] pressure also need to be reassessed as well as her chronic medical issues.   Discharge Diagnoses:  Principal Problem:   Acute respiratory failure with hypoxia (HCC) Active Problems:   Diabetes mellitus with hyperglycemia (HCC)   AKI (acute kidney injury) (HCC)   Chronic kidney disease (CKD), stage IV (severe) (HCC)   Cellulitis   History of stroke   Asthma exacerbation   Acute diastolic (congestive) heart failure (HCC)   Leukocytosis   E-coli UTI   Acute respiratory failure (HCC)   Type 2 diabetes, HbA1c goal < 7% (HCC)   Discharge Condition: Stable and improved  Diet recommendation: Renal diet  Filed Weights   02/25/15 2259 02/26/15 0300 02/26/15 2145  Weight: 185.1 kg (408 lb 1.1 oz) 185.1 kg (408 lb 1.1 oz) 186.4 kg (410 lb 15 oz)    History of present illness:  Per Dr [name] [name] is a 40 y.o. female  Of note patient is an extremely poor historian. Patient is attended to by her husband who also is a very poor historian. Patient unable to give any real direct answers with regards to timing of illness or specific symptoms. History was obtained in part by family but also by EDP. [facility].  PT discharged from hospital on 02/10/15. She had been admitted for CHF exacerbation and acute kidney injury versus CKD.   Patient states 7 days prior to admission, she developed loss of appetite, general  weakness and cough and wheezing. Patient unsure which medication she takes because they're given by her sister. When asked why she did not take her medications the patient got a look on her face and said that's a good idea. Patient stated that her sister gives her her medications. When discussing with the patient's sister over the phone she stated that the patient is unable to see. When discussing this with patient the patient stated she is able to see fine and is able to drive. Patient's husband also stated that she developed a left lower leg skin tear several days ago. Nothing has been used to try and make this better or worse. Patient stated that she has absolutely no pain in the area and doesn't notice the wound.  Patient stated that she had not eaten or had anything to drink in 4 days.  3 days prior to admission, she developed fever to 101.  Hospital Course:  1. Acute on chronic renal failure -Patient having a history of stage IV chronic kidney disease, recently hospitalized for acute on chronic renal failure, on 02/10/2015 had a creatinine of 3.74 with BUN of 30. She presented with generalized weakness and fatigue, with labs showing creatinine of 5.84 with BUN of 58. Patient likely worsening renal failure and may need hemodialysis. -She was seen and evaluated by nephrology recommending holding Lasix therapy, which she had been taking 120 mg twice daily. Patient was started on IV Lasix per nephrology at 80 every 12 hours as patient may have not responded to oral Lasix secondary to gut edema.  -On  recent hospitalization had extensive workup for renal failure which was unrevealing. -I suspect hemodialysis is a future intervention although it appears patient not agreeable to this at the time being. -Repeat labs on 02/21/2015 showing upward trend in creatinine 5.83>5.96. Her lasix was restarted on 02/20/2015 at 80 mg IV BID and creatinine trending down on a daily basis such that by day of discharge the  creatinine was down to 4.17. Patient on day of discharge was transitioned to oral Lasix 160 mg twice daily and was recommended that patient remain in-house to assess to see whether patient diuresis on oral Lasix however patient was insistent on being discharged and as such will be discharged home.   2. Right calf cellulitis -She was found to have a 8 cm x 4 cm skin tear in the right calf with some associated erythema. Labs revealed a white count of 19,800 having a temperature of 101.5 raising concern for infection -She was started on empiric IV antibiotic therapy with vancomycin  -White count fluctuated and patient seen by the wound care nurse. Patient was also seen in consultation by general surgeon who felt no surgical intervention was needed. Patient's white count trended down with steroid taper such that by day of discharge patient's white count was down to 12.6. CT of the tib-fib was done which was negative for any abscess formation and consistent with a cellulitis. Patient was subsequently transitioned from IV antibiotics to oral doxycycline and Keflex. Patient was discharged home on 3 more days of oral doxycycline and Keflex. Outpatient follow-up.   3. Acute respiratory failure secondary to acute diastolic heart failure and acute asthma exacerbation. -Evidenced by a respiratory rate of 25 having an O2 sat of 89% on 2 L supplemental oxygen. -Likely multifactorial secondary to asthma exacerbation and acute diastolic heart failure and worsening renal function and obesity hypoventilation syndrome.  Patient was admitted placed on IV diuretics, nebulizer treatments, IV steroids, empiric antibiotics. Patient improved clinically and IV steroids was subsequently transitioned to oral steroids and patient be discharged home on a steroid taper. Patient diuresed well and improve clinically and will be discharged home on oral Lasix. On day of discharge patient was satting 96% on room air.  4. History of  diastolic congestive heart failure -Last transthoracic echocardiogram was performed on 02/05/2015 that showed a preserved ejection fraction with grade 1 diastolic dysfunction. Patient was noted on admission to be significantly volume overloaded which was felt to be likely secondary to patient's worsening renal function. Patient was placed on high-dose IV Lasix with good urine output. Patient improved clinically. Patient was subsequently transitioned to oral Lasix on the day of discharge and patient will follow-up with PCP as outpatient.  5. Insulin-dependent diabetes mellitus. -During the hospitalization patient was noted to have some hyperglycemic episodes felt to be secondary to concomitant use of steroids. Patient was maintained on Lantus and dose adjusted accordingly and patient maintained on a sliding scale insulin. Patient will be discharged home on Lantus 30 units daily only to follow-up with PCP as outpatient.   6. Hypertension. -[name] pressures remain elevated with systolic [name] pressures initially during the hospitalization. Patient was subsequently placed on Coreg twice daily as well as imdur and Norvasc. Patient was also on IV Lasix during the hospitalization. Patient's [name] pressure remained controlled and patient is to follow-up with PCP as outpatient.  7. Hypokalemia -Likely secondary to diuresis. Repleted.   8. Escherichia coli UTI Urinalysis which was done was consistent with a UTI patient initially placed on IV  Rocephin. Urine cultures came back positive for Escherichia coli which was sensitive to the cephalosporins. Patient was subsequent to transitioned to oral Keflex and patient be discharged home on 3 more days of oral antibiotics.   Procedures: 2. Chest x-ray 02/19/2015, 02/20/2015 3. CT tib fib 02/25/2015  Consultations: 4. Nephrology: Dr. Eliott Nine 02/19/2015 5. Gen. surgery Dr. Derrell Lolling 02/21/2015  Discharge Exam: Filed Vitals:   02/27/15 0909 02/27/15 1409  BP:  174/74 157/86  Pulse: 82 75  Temp: 98.4 F (36.9 C)   Resp: 18     General: NAD Cardiovascular: RRR Respiratory: CTAB  Discharge Instructions   Discharge Instructions    Diet - low sodium heart healthy    Complete by:  As directed      Diet Carb Modified    Complete by:  As directed      Discharge instructions    Complete by:  As directed   Follow up with [name] in 1-2 weeks. Nephrology office will call you with appointment to see Dr Linda Humble in 2 weeks. Follow up in Dr Lily Lovings office in 1 week for labwork.     Increase activity slowly    Complete by:  As directed           Current Discharge Medication List    START taking these medications   Details  cephALEXin (KEFLEX) 250 MG capsule Take 1 capsule (250 mg total) by mouth every 8 (eight) hours. Take for 3 days then stop. Qty: 9 capsule, Refills: 0    doxycycline (VIBRA-TABS) 100 MG tablet Take 1 tablet (100 mg total) by mouth every 12 (twelve) hours. Take for 3 days then stop. Qty: 6 tablet, Refills: 0    isosorbide mononitrate (IMDUR) 30 MG 24 hr tablet Take 1 tablet (30 mg total) by mouth daily. Qty: 30 tablet, Refills: 3    potassium chloride SA (K-DUR,KLOR-CON) 20 MEQ tablet Take 2 tablets (40 mEq total) by mouth daily. Qty: 30 tablet, Refills: 0    predniSONE (DELTASONE) 20 MG tablet Take 1-2 tablets (20-40 mg total) by mouth daily before breakfast. Take 2 tablets (40mg )daily x 3 days, then 1 tablet (20mg ) daily x 3 days then stop. Qty: 9 tablet, Refills: 0      CONTINUE these medications which have CHANGED   Details  albuterol (PROVENTIL) (2.5 MG/3ML) 0.083% nebulizer solution Take 3 mLs (2.5 mg total) by nebulization every 6 (six) hours as needed for wheezing or shortness of breath. Use 3 times daily x 3 days, then every 6 hours as needed. Qty: 75 mL, Refills: 0    amLODipine (NORVASC) 10 MG tablet Take 1 tablet (10 mg total) by mouth daily. Qty: 30 tablet, Refills: 0    furosemide (LASIX)  80 MG tablet Take 2 tablets (160 mg total) by mouth 2 (two) times daily. Qty: 120 tablet, Refills: 0    insulin glargine (LANTUS) 100 UNIT/ML injection Inject 0.3 mLs (30 Units total) into the skin daily. Qty: 10 mL, Refills: 3      CONTINUE these medications which have NOT CHANGED   Details  acetaminophen (TYLENOL) 500 MG tablet Take 1,000 mg by mouth every 6 (six) hours as needed for fever.    albuterol (PROAIR HFA) 108 (90 Base) MCG/ACT inhaler Inhale 2 puffs into the lungs every 6 (six) hours as needed for wheezing or shortness of breath.    carvedilol (COREG) 25 MG tablet Take 1 tablet (25 mg total) by mouth 2 (two) times daily with a meal. Qty: 60  tablet, Refills: 1    ferrous sulfate 325 (65 FE) MG tablet Take 1 tablet (325 mg total) by mouth 3 (three) times daily with meals. Qty: 90 tablet, Refills: 3    insulin aspart (NOVOLOG) 100 UNIT/ML injection Inject 0-20 Units into the skin 3 (three) times daily with meals. CBG < 70: implement hypoglycemia protocol CBG 70 - 120: 0 units CBG 121 - 150: 3 units CBG 151 - 200: 4 units CBG 201 - 250: 7 units CBG 251 - 300: 11 units CBG 301 - 350: 15 units CBG 351 - 400: 20 units      STOP taking these medications     aspirin EC 325 MG EC tablet      atorvastatin (LIPITOR) 20 MG tablet      gemfibrozil (LOPID) 600 MG tablet        Allergies  Allergen Reactions  . Azithromycin Other (See Comments)    Nose bleeding event   Follow-up Information    Follow up with [facility], Fulton Reek, [name] In 2 weeks.   Specialty:  Nephrology   Why:  Marcelino Duster (patient Antonieta Pert) will call Mrs. Mckinnon-[name] at [phone] to schedule a follow up appointment. Thank you    Contact information:   [address] [place] [address] [phone]       Follow up with Arita Miss, [name] In 1 week.   Specialty:  Nephrology   Why:  for labs. Office will call   Contact information:   [address] [place] [address] [phone]       Follow  up with Linda Blood, [name]. Schedule an appointment as soon as possible for a visit in 1 week.   Specialty:  Internal Medicine   Why:  f/u in 1-2 weeks   Contact information:   [address] [place] [address] [phone]        The results of significant diagnostics from this hospitalization (including imaging, microbiology, ancillary and laboratory) are listed below for reference.    Significant Diagnostic Studies: Dg Chest 2 View  02/05/2015  CLINICAL DATA:  40 year old female with shortness of breath and bilateral lower extremity swelling. EXAM: CHEST  2 VIEW COMPARISON:  Radiograph dated 10/26/2013 FINDINGS: Evaluation is limited due to soft tissue attenuation. Two views of the chest do not demonstrate focal consolidation. There is no pleural effusion or pneumothorax. - there is mild cardiomegaly. The osseous structures appear unremarkable. IMPRESSION: No focal consolidation. Electronically Signed   By: Elgie Collard M.D.   On: 02/05/2015 00:04   US Renal  02/08/2015  CLINICAL DATA:  Patient with chronic renal disease.  Morbid obesity. EXAM: RENAL / URINARY TRACT ULTRASOUND COMPLETE COMPARISON:  Pelvic ultrasound 08/27/2012. FINDINGS: Right Kidney: Length: 11.4 cm. Echogenicity within normal limits. No mass or hydronephrosis visualized. Left Kidney: Length: 11.2 cm. Echogenicity within normal limits. No mass or hydronephrosis visualized. Bladder: Appears normal for degree of bladder distention. IMPRESSION: No hydronephrosis. Electronically Signed   By: Annia Belt M.D.   On: 02/08/2015 21:38   Ct Tibia Fibula Right Wo Contrast  02/25/2015  CLINICAL DATA:  Right lower leg cellulitis. Evaluate for necrotizing fasciitis or abscess. No intravenous contrast due to elevated creatinine. EXAM: CT OF THE RIGHT TIBIA FIBULA WITHOUT CONTRAST TECHNIQUE: Multidetector CT imaging was performed according to the standard protocol. Multiplanar CT image reconstructions were also generated.  COMPARISON:  Right ankle plain films 06/08/2013 FINDINGS: Examination demonstrates moderate diffuse subcutaneous edema from the right knee to the right ankle an oval without  focal fluid collection to suggest subcutaneous abscess. No evidence of air within the soft tissues. Underlying muscle compartments are within normal without evidence of focal fluid collections or air. There are mild-to-moderate tricompartmental osteoarthritic changes of the right knee. Minimal degenerative change of the tibiotalar joint. Chronic changes of the medial malleolus. No fracture, dislocation or focal lytic process to suggest osteomyelitis. Minimal spurring at the Achilles tendon insertion site. IMPRESSION: Moderate subcutaneous edema over the right lower leg and ankle/ foot. Findings compatible with history of cellulitis. No evidence of air within the soft tissues to suggest necrotizing fasciitis. No definite focal fluid collection to suggest subcutaneous abscess. Normal appearance of underlying muscle compartments. Electronically Signed   By: Elberta Fortis M.D.   On: 02/25/2015 10:41   Dg Chest Port 1 View  02/20/2015  CLINICAL DATA:  Acute respiratory failure. EXAM: PORTABLE CHEST 1 VIEW COMPARISON:  02/19/2015. FINDINGS: Mediastinum and hilar structures normal. Cardiomegaly with pulmonary venous congestion and bilateral pulmonary infiltrates consistent congestive heart failure. Small left pleural effusion cannot be excluded. No pneumothorax. IMPRESSION: Progressive changes of congestive heart failure with bilateral pulmonary edema. Small left pleural effusion cannot be excluded. Electronically Signed   By: Maisie Fus  Register   On: 02/20/2015 07:48   Dg Chest Port 1 View  02/19/2015  CLINICAL DATA:  Weakness. EXAM: PORTABLE CHEST 1 VIEW COMPARISON:  02/04/2015 FINDINGS: There is moderate cardiac enlargement. Asymmetric elevation of right hemidiaphragm noted. No pleural effusion or edema identified. IMPRESSION: 1. Cardiac  enlargement. Electronically Signed   By: Signa Kell M.D.   On: 02/19/2015 11:32    Microbiology: Recent Results (from the past 240 hour(s))  Culture, [name] (routine x 2)     Status: None   Collection Time: 02/19/15 12:53 PM  Result Value Ref Range Status   Specimen Description [name] RIGHT FOREARM  Final   Special Requests BOTTLES DRAWN AEROBIC AND ANAEROBIC  Final   Culture NO GROWTH 5 DAYS  Final   Report Status 02/24/2015 FINAL  Final  Culture, [name] (routine x 2)     Status: None   Collection Time: 02/19/15  1:00 PM  Result Value Ref Range Status   Specimen Description [name] LEFT ANTECUBITAL  Final   Special Requests BOTTLES DRAWN AEROBIC AND ANAEROBIC  Final   Culture NO GROWTH 5 DAYS  Final   Report Status 02/24/2015 FINAL  Final  C difficile quick scan w PCR reflex     Status: None   Collection Time: 02/20/15  2:14 PM  Result Value Ref Range Status   C Diff antigen NEGATIVE NEGATIVE Final   C Diff toxin NEGATIVE NEGATIVE Final   C Diff interpretation Negative for toxigenic C. difficile  Final  Urine culture     Status: None   Collection Time: 02/22/15  9:48 AM  Result Value Ref Range Status   Specimen Description URINE, RANDOM  Final   Special Requests NONE  Final   Culture >=100,000 COLONIES/mL ESCHERICHIA COLI  Final   Report Status 02/24/2015 FINAL  Final   Organism ID, Bacteria ESCHERICHIA COLI  Final      Susceptibility   Escherichia coli - MIC*    AMPICILLIN <=2 SENSITIVE Sensitive     CEFAZOLIN <=4 SENSITIVE Sensitive     CEFTRIAXONE <=1 SENSITIVE Sensitive     CIPROFLOXACIN <=0.25 SENSITIVE Sensitive     GENTAMICIN <=1 SENSITIVE Sensitive     IMIPENEM <=0.25 SENSITIVE Sensitive     NITROFURANTOIN <=16 SENSITIVE Sensitive  TRIMETH/SULFA <=20 SENSITIVE Sensitive     AMPICILLIN/SULBACTAM <=2 SENSITIVE Sensitive     PIP/TAZO <=4 SENSITIVE Sensitive     * >=100,000 COLONIES/mL ESCHERICHIA COLI     Labs: Basic Metabolic Panel:  Recent  Labs Lab 02/23/15 0625 02/24/15 0552 02/25/15 0541 02/26/15 1030 02/27/15 0615  NA 135 136 137 137 138  K 3.6 3.4* 3.9 4.3 4.6  CL 100* 102 105 101 105  CO2 23 23 21* 22 24  GLUCOSE 410* 274* 185* 192* 229*  BUN 96* 97* 100* 104* 108*  CREATININE 5.17* 4.97* 4.57* 4.26* 4.17*  CALCIUM 8.7* 8.6* 8.6* 8.5* 8.5*  PHOS  --  4.3 4.6 5.0* 5.1*   Liver Function Tests:  Recent Labs Lab 02/24/15 0552 02/25/15 0541 02/26/15 1030 02/27/15 0615  ALBUMIN 1.9* 1.9* 1.7* 1.9*   No results for input(s): LIPASE, AMYLASE in the last 168 hours. No results for input(s): AMMONIA in the last 168 hours. CBC:  Recent Labs Lab 02/23/15 0625 02/24/15 0552 02/25/15 0541 02/26/15 0514 02/27/15 0615  WBC 14.8* 16.9* 14.1* 14.8* 12.6*  NEUTROABS 13.5*  --   --   --   --   HGB 9.9* 10.4* 11.3* 10.3* 9.8*  HCT 30.0* 31.8* 33.4* 31.3* 30.2*  MCV 80.6 80.7 80.3 80.3 80.1  PLT 340 386 349 333 327   Cardiac Enzymes: No results for input(s): CKTOTAL, CKMB, CKMBINDEX, TROPONINI in the last 168 hours. BNP: BNP (last 3 results)  Recent Labs  02/04/15 0031 02/19/15 1123  BNP 349.2* 921.9*    ProBNP (last 3 results) No results for input(s): PROBNP in the last 8760 hours.  CBG:  Recent Labs Lab 02/26/15 1124 02/26/15 1732 02/26/15 2144 02/27/15 0815 02/27/15 1131  GLUCAP 171* 184* 212* 197* 200*       Signed:  Barrett Goldie [name].  [facility] 02/27/2015, 4:03 PM

## 2015-04-20 ENCOUNTER — Encounter (HOSPITAL_COMMUNITY): Payer: Self-pay | Admitting: Emergency Medicine

## 2015-04-20 ENCOUNTER — Emergency Department (INDEPENDENT_AMBULATORY_CARE_PROVIDER_SITE_OTHER)
Admission: EM | Admit: 2015-04-20 | Discharge: 2015-04-20 | Disposition: A | Discharge status: Home / Self Care | Payer: Medicaid Other | Source: Home / Self Care | Attending: Emergency Medicine | Admitting: Emergency Medicine

## 2015-04-20 DIAGNOSIS — S81801A Unspecified open wound, right lower leg, initial encounter: Secondary | ICD-10-CM | POA: Diagnosis not present

## 2015-04-20 MED ORDER — CEPHALEXIN 500 MG PO CAPS: 500.0000 mg | ORAL_CAPSULE | Freq: Three times a day (TID) | ORAL | Status: DC

## 2015-04-20 NOTE — ED Notes (Signed)
Pt has a wound on her right calf that has been being treated since possibly December of this past year.  She is seen by a home health RN once or twice a week for wound check and dressing changes.  Pt and family state the RN is concerned about the odor and yellow drainage from her wound.  Pt states she doesn't see her primary care doctor until Monday and RN said she needed to be seen sooner and her MD could not see her today.  Pt denies any fever or pain.

## 2015-04-20 NOTE — ED Provider Notes (Signed)
CSN: [id number]     Arrival date & time 04/20/15  1420 History   First MD Initiated Contact with Patient 04/20/15 1535     Chief Complaint  Patient presents with  . Wound Infection   (Consider location/radiation/quality/duration/timing/severity/associated sxs/prior Treatment) HPI She is a 40 year old woman here for evaluation of right leg wound. 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 Her sister provides wound care at home. A nurse does come out every week to check it. When the nurse came out this week, she was concerned about increased drainage and foul odor. Patient denies any change in pain. She does state there is been a little bit more discharge. They have been doing wet-to-dry dressing changes with an antibacterial ointment. She has an appointment with her primary care doctor on Monday. She also has an appointment with the wound care specialist for the 22nd.  Past Medical History  Diagnosis Date  . Morbid obesity (HCC)   . Hypertension   . CVA (cerebral infarction) 1990's    "writing is not the same since" R leg weakness   . Asthma   . Type II diabetes mellitus (HCC)   . GERD (gastroesophageal reflux disease)   . Stroke (HCC)   . CHF (congestive heart failure) (HCC)     diastolic   Past Surgical History  Procedure Laterality Date  . Incision and drainage of wound Left 08/30/2013    thigh necrotic wound/notes 08/30/2013  . Irrigation and debridement abscess Left 08/30/2013    Procedure: IRRIGATION AND DEBRIDEMENT ABSCESS Left Thigh Necrotic Wound;  Surgeon: Axel FillerArmando Ramirez, MD;  Location: [facility] OR;  Service: General;  Laterality: Left;  . Tee without cardioversion N/A 09/07/2013    Procedure: TRANSESOPHAGEAL ECHOCARDIOGRAM (TEE);  Surgeon: Chrystie NoseKenneth C. Hilty, MD;  [facility];  Service: Cardiovascular;  Laterality: N/A;  . Tee without cardioversion N/A 02/08/2015    Procedure: TRANSESOPHAGEAL ECHOCARDIOGRAM (TEE);  Surgeon: Chilton Siiffany Van Buren, MD;   [facility];  Service: Cardiovascular;  Laterality: N/A;   Family History  Problem Relation Age of Onset  . Diabetes Mother   . Kidney disease Mother    Social History  Substance Use Topics  . Smoking status: Never Smoker   . Smokeless tobacco: Never Used  . Alcohol Use: No   OB History    Gravida Para Term Preterm AB TAB SAB Ectopic Multiple Living   3 3 3  0 0 0 0 0 0 3     Review of Systems As in history of present illness Allergies  Azithromycin  Home Medications   Prior to Admission medications   Medication Sig Start Date End Date Taking? Authorizing Provider  acetaminophen (TYLENOL) 500 MG tablet Take 1,000 mg by mouth every 6 (six) hours as needed for fever.   Yes Historical Provider, MD  albuterol (PROAIR HFA) 108 (90 Base) MCG/ACT inhaler Inhale 2 puffs into the lungs every 6 (six) hours as needed for wheezing or shortness of breath.   Yes Historical Provider, MD  albuterol (PROVENTIL) (2.5 MG/3ML) 0.083% nebulizer solution Take 3 mLs (2.5 mg total) by nebulization every 6 (six) hours as needed for wheezing or shortness of breath. Use 3 times daily x 3 days, then every 6 hours as needed. 02/27/15  Yes Rodolph Bonganiel Thompson V, MD  amLODipine (NORVASC) 10 MG tablet Take 1 tablet (10 mg total) by mouth daily. 02/27/15  Yes Rodolph Bonganiel Thompson V, MD  carvedilol (COREG) 25 MG tablet Take 1 tablet (25 mg total) by  mouth 2 (two) times daily with a meal. 02/10/15  Yes Costin Otelia Sergeant, MD  ferrous sulfate 325 (65 FE) MG tablet Take 1 tablet (325 mg total) by mouth 3 (three) times daily with meals. 02/10/15  Yes Costin Otelia Sergeant, MD  furosemide (LASIX) 80 MG tablet Take 2 tablets (160 mg total) by mouth 2 (two) times daily. 02/27/15  Yes Rodolph Bong, MD  insulin aspart (NOVOLOG) 100 UNIT/ML injection Inject 0-20 Units into the skin 3 (three) times daily with meals. CBG < 70: implement hypoglycemia protocol CBG 70 - 120: 0 units CBG 121 - 150: 3 units CBG 151 - 200: 4  units CBG 201 - 250: 7 units CBG 251 - 300: 11 units CBG 301 - 350: 15 units CBG 351 - 400: 20 units 09/09/13  Yes Maryann Mikhail, DO  insulin glargine (LANTUS) 100 UNIT/ML injection Inject 0.3 mLs (30 Units total) into the skin daily. 02/27/15  Yes Rodolph Bong, MD  isosorbide mononitrate (IMDUR) 30 MG 24 hr tablet Take 1 tablet (30 mg total) by mouth daily. 02/27/15  Yes Rodolph Bong, MD  potassium chloride SA (K-DUR,KLOR-CON) 20 MEQ tablet Take 2 tablets (40 mEq total) by mouth daily. 02/27/15  Yes Rodolph Bong, MD  cephALEXin (KEFLEX) 500 MG capsule Take 1 capsule (500 mg total) by mouth 3 (three) times daily. 04/20/15   Charm Rings, MD   Meds Ordered and Administered this Visit  Medications - No data to display  BP 178/89 mmHg  Pulse 93  Temp(Src) 98 F (36.7 C) (Oral)  Resp 18  SpO2 100%  LMP 04/12/2015 (Exact Date) No data found.   Physical Exam  Constitutional: She is oriented to person, place, and time. She appears well-developed and well-nourished. No distress.  Cardiovascular: Normal rate.   Pulmonary/Chest: Effort normal.  Neurological: She is alert and oriented to person, place, and time.  Skin:  She has a large ulceration on the right calf. This covers almost the entire medial part of the calf. There is a yellow proteinaceous deposit covering the wound. Wound edges are clean. There is slight erythema at the wound edges. No tenderness. There is some clear drainage.    ED Course  Procedures (including critical care time)  Labs Review Labs Reviewed - No data to display  Imaging Review No results found.   MDM   1. Leg wound, right, initial encounter    Will cover possible early infection with Keflex for one week. Discussed that the yellow deposit is likely protein. This will need to be removed in order for the wound to heal. Follow-up with PCP and wound care as scheduled.    Charm Rings, MD 04/20/15 [phone]

## 2015-04-20 NOTE — Discharge Instructions (Signed)
The wound actually looks very clean. It does have some proteinaceous deposit that the wound care doctor will likely remove. Given the increased drainage, I will put you on 7 days of Keflex. Please continue the wound care you have been doing. Follow-up with your doctor and the wound care specialist as scheduled.

## 2015-04-26 ENCOUNTER — Encounter (HOSPITAL_BASED_OUTPATIENT_CLINIC_OR_DEPARTMENT_OTHER): Payer: Medicaid Other | Attending: Surgery

## 2015-04-26 DIAGNOSIS — E1122 Type 2 diabetes mellitus with diabetic chronic kidney disease: Secondary | ICD-10-CM | POA: Diagnosis not present

## 2015-04-26 DIAGNOSIS — Z794 Long term (current) use of insulin: Secondary | ICD-10-CM | POA: Diagnosis not present

## 2015-04-26 DIAGNOSIS — Z8673 Personal history of transient ischemic attack (TIA), and cerebral infarction without residual deficits: Secondary | ICD-10-CM | POA: Diagnosis not present

## 2015-04-26 DIAGNOSIS — N184 Chronic kidney disease, stage 4 (severe): Secondary | ICD-10-CM | POA: Insufficient documentation

## 2015-04-26 DIAGNOSIS — I130 Hypertensive heart and chronic kidney disease with heart failure and stage 1 through stage 4 chronic kidney disease, or unspecified chronic kidney disease: Secondary | ICD-10-CM | POA: Diagnosis not present

## 2015-04-26 DIAGNOSIS — I509 Heart failure, unspecified: Secondary | ICD-10-CM | POA: Diagnosis not present

## 2015-04-26 DIAGNOSIS — L97212 Non-pressure chronic ulcer of right calf with fat layer exposed: Secondary | ICD-10-CM | POA: Diagnosis not present

## 2015-04-26 DIAGNOSIS — E11622 Type 2 diabetes mellitus with other skin ulcer: Secondary | ICD-10-CM | POA: Diagnosis present

## 2015-04-26 DIAGNOSIS — Z6841 Body Mass Index (BMI) 40.0 and over, adult: Secondary | ICD-10-CM | POA: Diagnosis not present

## 2015-04-29 ENCOUNTER — Emergency Department (HOSPITAL_COMMUNITY)
Admission: EM | Admit: 2015-04-29 | Discharge: 2015-04-29 | Disposition: A | Discharge status: Home / Self Care | Payer: Medicaid Other | Attending: Emergency Medicine | Admitting: Emergency Medicine

## 2015-04-29 ENCOUNTER — Encounter (HOSPITAL_COMMUNITY): Payer: Self-pay

## 2015-04-29 DIAGNOSIS — Z8673 Personal history of transient ischemic attack (TIA), and cerebral infarction without residual deficits: Secondary | ICD-10-CM | POA: Diagnosis not present

## 2015-04-29 DIAGNOSIS — I509 Heart failure, unspecified: Secondary | ICD-10-CM | POA: Insufficient documentation

## 2015-04-29 DIAGNOSIS — Z8719 Personal history of other diseases of the digestive system: Secondary | ICD-10-CM | POA: Diagnosis not present

## 2015-04-29 DIAGNOSIS — Z792 Long term (current) use of antibiotics: Secondary | ICD-10-CM | POA: Insufficient documentation

## 2015-04-29 DIAGNOSIS — S81801A Unspecified open wound, right lower leg, initial encounter: Secondary | ICD-10-CM | POA: Diagnosis present

## 2015-04-29 DIAGNOSIS — Y998 Other external cause status: Secondary | ICD-10-CM | POA: Diagnosis not present

## 2015-04-29 DIAGNOSIS — J45909 Unspecified asthma, uncomplicated: Secondary | ICD-10-CM | POA: Insufficient documentation

## 2015-04-29 DIAGNOSIS — Z79899 Other long term (current) drug therapy: Secondary | ICD-10-CM | POA: Diagnosis not present

## 2015-04-29 DIAGNOSIS — E119 Type 2 diabetes mellitus without complications: Secondary | ICD-10-CM | POA: Diagnosis not present

## 2015-04-29 DIAGNOSIS — I10 Essential (primary) hypertension: Secondary | ICD-10-CM | POA: Insufficient documentation

## 2015-04-29 DIAGNOSIS — Y9289 Other specified places as the place of occurrence of the external cause: Secondary | ICD-10-CM | POA: Diagnosis not present

## 2015-04-29 DIAGNOSIS — X58XXXA Exposure to other specified factors, initial encounter: Secondary | ICD-10-CM | POA: Insufficient documentation

## 2015-04-29 DIAGNOSIS — Z794 Long term (current) use of insulin: Secondary | ICD-10-CM | POA: Insufficient documentation

## 2015-04-29 DIAGNOSIS — Y9389 Activity, other specified: Secondary | ICD-10-CM | POA: Diagnosis not present

## 2015-04-29 LAB — CBC WITH DIFFERENTIAL/PLATELET
Basophils Absolute: 0 10*3/uL (ref 0.0–0.1)
Basophils Relative: 0 %
Eosinophils Absolute: 0.3 10*3/uL (ref 0.0–0.7)
Eosinophils Relative: 4 %
HCT: 30.5 % — ABNORMAL LOW (ref 36.0–46.0)
Hemoglobin: 9.4 g/dL — ABNORMAL LOW (ref 12.0–15.0)
Lymphocytes Relative: 43 %
Lymphs Abs: 2.9 10*3/uL (ref 0.7–4.0)
MCH: 25.6 pg — ABNORMAL LOW (ref 26.0–34.0)
MCHC: 30.8 g/dL (ref 30.0–36.0)
MCV: 83.1 fL (ref 78.0–100.0)
Monocytes Absolute: 0.5 10*3/uL (ref 0.1–1.0)
Monocytes Relative: 7 %
Neutro Abs: 3.1 10*3/uL (ref 1.7–7.7)
Neutrophils Relative %: 46 %
Platelets: 329 10*3/uL (ref 150–400)
RBC: 3.67 MIL/uL — ABNORMAL LOW (ref 3.87–5.11)
RDW: 15.4 % (ref 11.5–15.5)
WBC: 6.8 10*3/uL (ref 4.0–10.5)

## 2015-04-29 LAB — BASIC METABOLIC PANEL
Anion gap: 11 (ref 5–15)
BUN: 23 mg/dL — ABNORMAL HIGH (ref 6–20)
CO2: 24 mmol/L (ref 22–32)
Calcium: 9 mg/dL (ref 8.9–10.3)
Chloride: 104 mmol/L (ref 101–111)
Creatinine, Ser: 2.73 mg/dL — ABNORMAL HIGH (ref 0.44–1.00)
GFR calc Af Amer: 24 mL/min — ABNORMAL LOW (ref >60)
GFR calc non Af Amer: 21 mL/min — ABNORMAL LOW (ref >60)
Glucose, Bld: 147 mg/dL — ABNORMAL HIGH (ref 65–99)
Potassium: 4 mmol/L (ref 3.5–5.1)
Sodium: 139 mmol/L (ref 135–145)

## 2015-04-29 MED ORDER — DOXYCYCLINE HYCLATE 100 MG PO CAPS: 100.0000 mg | ORAL_CAPSULE | Freq: Two times a day (BID) | ORAL | Status: DC

## 2015-04-29 MED ORDER — VANCOMYCIN HCL 10 G IV SOLR: 1500.0000 mg | INTRAVENOUS | Status: DC

## 2015-04-29 MED ORDER — VANCOMYCIN HCL 10 G IV SOLR
2500.0000 mg | Freq: Once | INTRAVENOUS | Status: AC
  Administered 2015-04-29: 2500 mg via INTRAVENOUS
  Filled 2015-04-29: qty 2500

## 2015-04-29 NOTE — ED Notes (Signed)
Pt reports she is at ED per recommendations from Dr.Britto to have wound on right posterior calf debrided under anesthesia and wound VAC.

## 2015-04-29 NOTE — Progress Notes (Addendum)
Pharmacy Antibiotic Note  [name] is a 40 y.o. female admitted on 04/29/2015 with wound infection.  Pharmacy has been consulted for vancomycin dosing.  Plan: -Vancomycin 2500mg  IV x1, then 1500mg  IV q24h per obesity nomogram -Goal trough 15-20 mcg/mL with very large and deep-looking wound  Height: 5\' 5"  (165.1 cm) Weight: (!) 306 lb 6 oz (138.971 kg) IBW/kg (Calculated) : 57  Temp (24hrs), Avg:99 F (37.2 C), Min:99 F (37.2 C), Max:99 F (37.2 C)  No results for input(s): WBC, CREATININE, LATICACIDVEN, VANCOTROUGH, VANCOPEAK, VANCORANDOM, GENTTROUGH, GENTPEAK, GENTRANDOM, TOBRATROUGH, TOBRAPEAK, TOBRARND, AMIKACINPEAK, AMIKACINTROU, AMIKACIN in the last 168 hours.  CrCl cannot be calculated (Patient has no serum creatinine result on file.).    Allergies  Allergen Reactions  . Azithromycin Other (See Comments)    Nose bleeding event    Antimicrobials this admission: vancomycin 3/18 >>   Dose adjustments this admission: n/a  Microbiology results: N/a   Thank you for allowing pharmacy to be a part of this patient's care.  Anyela Napierkowski D. Dakwon Wenberg, PharmD, BCPS Clinical Pharmacist Pager: [phone] 04/29/2015 6:56 PM

## 2015-04-29 NOTE — ED Provider Notes (Signed)
CSN: [id number]     Arrival date & time 04/29/15  1444 History   First MD Initiated Contact with Patient 04/29/15 1733     Chief Complaint  Patient presents with  . Wound Infection     (Consider location/radiation/quality/duration/timing/severity/associated sxs/prior Treatment) HPI   40 year old female with chronic right calf wound. She's had this for several months. Has been getting wound care from her family and the more recently referred to wound care center. She was referred from the wound care center to the emergency room today for evaluation for possible operative debridement. She has had foul-smelling drainage from the area. No fevers or chills. No surrounding redness.  Past Medical History  Diagnosis Date  . Morbid obesity (HCC)   . Hypertension   . CVA (cerebral infarction) 1990's    "writing is not the same since" R leg weakness   . Asthma   . Type II diabetes mellitus (HCC)   . GERD (gastroesophageal reflux disease)   . Stroke (HCC)   . CHF (congestive heart failure) (HCC)     diastolic   Past Surgical History  Procedure Laterality Date  . Incision and drainage of wound Left 08/30/2013    thigh necrotic wound/notes 08/30/2013  . Irrigation and debridement abscess Left 08/30/2013    Procedure: IRRIGATION AND DEBRIDEMENT ABSCESS Left Thigh Necrotic Wound;  Surgeon: Axel FillerArmando Ramirez, MD;  Location: [facility] OR;  Service: General;  Laterality: Left;  . Tee without cardioversion N/A 09/07/2013    Procedure: TRANSESOPHAGEAL ECHOCARDIOGRAM (TEE);  Surgeon: Chrystie NoseKenneth C. Hilty, MD;  [facility];  Service: Cardiovascular;  Laterality: N/A;  . Tee without cardioversion N/A 02/08/2015    Procedure: TRANSESOPHAGEAL ECHOCARDIOGRAM (TEE);  Surgeon: Chilton Siiffany [place] Alexander, MD;  [facility];  Service: Cardiovascular;  Laterality: N/A;   Family History  Problem Relation Age of Onset  . Diabetes Mother   . Kidney disease Mother    Social History  Substance Use Topics  .  Smoking status: Never Smoker   . Smokeless tobacco: Never Used  . Alcohol Use: No   OB History    Gravida Para Term Preterm AB TAB SAB Ectopic Multiple Living   3 3 3  0 0 0 0 0 0 3     Review of Systems  All systems reviewed and negative, other than as noted in HPI.   Allergies  Azithromycin  Home Medications   Prior to Admission medications   Medication Sig Start Date End Date Taking? Authorizing Provider  acetaminophen (TYLENOL) 500 MG tablet Take 1,000 mg by mouth every 6 (six) hours as needed for fever.    Historical Provider, MD  albuterol (PROAIR HFA) 108 (90 Base) MCG/ACT inhaler Inhale 2 puffs into the lungs every 6 (six) hours as needed for wheezing or shortness of breath.    Historical Provider, MD  albuterol (PROVENTIL) (2.5 MG/3ML) 0.083% nebulizer solution Take 3 mLs (2.5 mg total) by nebulization every 6 (six) hours as needed for wheezing or shortness of breath. Use 3 times daily x 3 days, then every 6 hours as needed. 02/27/15   Rodolph Bonganiel Thompson V, MD  amLODipine (NORVASC) 10 MG tablet Take 1 tablet (10 mg total) by mouth daily. 02/27/15   Rodolph Bonganiel Thompson V, MD  carvedilol (COREG) 25 MG tablet Take 1 tablet (25 mg total) by mouth 2 (two) times daily with a meal. 02/10/15   Costin Otelia SergeantM Gherghe, MD  cephALEXin (KEFLEX) 500 MG capsule Take 1 capsule (500 mg total) by mouth 3 (three) times daily. 04/20/15  Charm Rings, MD  ferrous sulfate 325 (65 FE) MG tablet Take 1 tablet (325 mg total) by mouth 3 (three) times daily with meals. 02/10/15   Costin Otelia Sergeant, MD  furosemide (LASIX) 80 MG tablet Take 2 tablets (160 mg total) by mouth 2 (two) times daily. 02/27/15   Rodolph Bong, MD  insulin aspart (NOVOLOG) 100 UNIT/ML injection Inject 0-20 Units into the skin 3 (three) times daily with meals. CBG < 70: implement hypoglycemia protocol CBG 70 - 120: 0 units CBG 121 - 150: 3 units CBG 151 - 200: 4 units CBG 201 - 250: 7 units CBG 251 - 300: 11 units CBG 301 - 350: 15  units CBG 351 - 400: 20 units 09/09/13   Maryann Mikhail, DO  insulin glargine (LANTUS) 100 UNIT/ML injection Inject 0.3 mLs (30 Units total) into the skin daily. 02/27/15   Rodolph Bong, MD  isosorbide mononitrate (IMDUR) 30 MG 24 hr tablet Take 1 tablet (30 mg total) by mouth daily. 02/27/15   Rodolph Bong, MD  potassium chloride SA (K-DUR,KLOR-CON) 20 MEQ tablet Take 2 tablets (40 mEq total) by mouth daily. 02/27/15   Rodolph Bong, MD   BP 148/106 mmHg  Pulse 98  Temp(Src) 99 F (37.2 C) (Oral)  Resp 14  Ht  (1.651 m)  Wt 306 lb 6 oz (138.971 kg)  BMI 50.98 kg/m2  SpO2 98%  LMP 04/12/2015 (Exact Date) Physical Exam  Constitutional: She appears well-developed and well-nourished. No distress.  HENT:  Head: Normocephalic and atraumatic.  Eyes: Conjunctivae are normal. Right eye exhibits no discharge. Left eye exhibits no discharge.  Neck: Neck supple.  Cardiovascular: Normal rate, regular rhythm and normal heart sounds.  Exam reveals no gallop and no friction rub.   No murmur heard. Pulmonary/Chest: Effort normal and breath sounds normal. No respiratory distress.  Abdominal: Soft. She exhibits no distension. There is no tenderness.  Musculoskeletal:  Large wound to R calf as pictured below. Foul smell. Some purulent drainage. No surrounding cellulitis.   Neurological: She is alert.  Skin: Skin is warm and dry.  Psychiatric: She has a normal mood and affect. Her behavior is normal. Thought content normal.  Nursing note and vitals reviewed.   ED Course  Procedures (including critical care time)  Selective Debridement -Pertinent history was obtained, examination performed and decision was made to perform debridement.  -Patient was advised of treatment and verbal consent was obtained.  -A photo of the wound was taking before and after treatment. -The area for debridement was prepped and cleansed. -Debridement of devitalized subcutaneous tissue of the right calf  was performed with a scalpel and blunt dissection. -Mostly necrotic fat with some purulence and thick fibrous tissue towards the inferior aspect of the wound. -Total area of debridement was 125 cm. -Some mild bleeding occurred which was controlled with gentle pressure. -The patient tolerated the procedure very well. -Wound was then dressed with a wet-to-dry dressing. -Instructions were provided to the patient and her family at bedside. -She is to follow back up at the wound care center. - Labs Review Labs Reviewed  CBC WITH DIFFERENTIAL/PLATELET - Abnormal; Notable for the following:    RBC 3.67 (*)    Hemoglobin 9.4 (*)    HCT 30.5 (*)    MCH 25.6 (*)    All other components within normal limits  BASIC METABOLIC PANEL - Abnormal; Notable for the following:    Glucose, Bld 147 (*)    BUN  23 (*)    Creatinine, Ser 2.73 (*)    GFR calc non Af Amer 21 (*)    GFR calc Af Amer 24 (*)    All other components within normal limits  CULTURE, BLOOD (ROUTINE X 2)    Imaging Review No results found. I have personally reviewed and evaluated these images and lab results as part of my medical decision-making.   EKG Interpretation None      MDM   Final diagnoses:  Leg wound, right, initial encounter    39yF with chronic R calf wound as below.  Patient morbidly obese and multiple other underlying medical issues. Not great candidate for general anesthesia.  I debrided at bedside as best the patient could tolerate. Large amount of necrotic fat removed. Mostly healthy appearing granulation tissue at base. Some adherent fibrous tissue towards inferior aspect of wound. May need cleaned up better, but should have significantly better wound healing now that most of nonviable tissue removed.          [name], MD 05/04/15 [phone]

## 2015-04-29 NOTE — ED Notes (Signed)
Wound care given, wet to dry dressing applied as ordered by MD, Vancomycin half way through, pt a family member updated about POC, pt will be OP wound care treatment.

## 2015-04-29 NOTE — Discharge Instructions (Signed)
Wound Care °Taking care of your wound properly can help to prevent pain and infection. It can also help your wound to heal more quickly.  °HOW TO CARE FOR YOUR WOUND  °· Take or apply over-the-counter and prescription medicines only as told by your health care provider. °· If you were prescribed antibiotic medicine, take or apply it as told by your health care provider. Do not stop using the antibiotic even if your condition improves. °· Clean the wound each day or as told by your health care provider. °¨ Wash the wound with mild soap and water. °¨ Rinse the wound with water to remove all soap. °¨ Pat the wound dry with a clean towel. Do not rub it. °· There are many different ways to close and cover a wound. For example, a wound can be covered with stitches (sutures), skin glue, or adhesive strips. Follow instructions from your health care provider about: °¨ How to take care of your wound. °¨ When and how you should change your bandage (dressing). °¨ When you should remove your dressing. °¨ Removing whatever was used to close your wound. °· Check your wound every day for signs of infection. Watch for: °¨ Redness, swelling, or pain. °¨ Fluid, blood, or pus. °· Keep the dressing dry until your health care provider says it can be removed. Do not take baths, swim, use a hot tub, or do anything that would put your wound underwater until your health care provider approves. °· Raise (elevate) the injured area above the level of your heart while you are sitting or lying down. °· Do not scratch or pick at the wound. °· Keep all follow-up visits as told by your health care provider. This is important. °SEEK MEDICAL CARE IF: °· You received a tetanus shot and you have swelling, severe pain, redness, or bleeding at the injection site. °· You have a fever. °· Your pain is not controlled with medicine. °· You have increased redness, swelling, or pain at the site of your wound. °· You have fluid, blood, or pus coming from your  wound. °· You notice a bad smell coming from your wound or your dressing. °SEEK IMMEDIATE MEDICAL CARE IF: °· You have a red streak going away from your wound. °  °This information is not intended to replace advice given to you by your health care provider. Make sure you discuss any questions you have with your health care provider. °  °Document Released: 11/07/2007 Document Revised: 06/14/2014 Document Reviewed: 01/24/2014 °[employer]vier Interactive Patient Education ©2016 [employer] ° °

## 2015-05-03 ENCOUNTER — Encounter (HOSPITAL_BASED_OUTPATIENT_CLINIC_OR_DEPARTMENT_OTHER): Payer: Medicaid Other

## 2015-05-03 DIAGNOSIS — E11622 Type 2 diabetes mellitus with other skin ulcer: Secondary | ICD-10-CM | POA: Diagnosis not present

## 2015-05-04 LAB — CULTURE, BLOOD (ROUTINE X 2): Culture: NO GROWTH

## 2015-05-10 DIAGNOSIS — E11622 Type 2 diabetes mellitus with other skin ulcer: Secondary | ICD-10-CM | POA: Diagnosis not present

## 2015-05-17 ENCOUNTER — Encounter (HOSPITAL_BASED_OUTPATIENT_CLINIC_OR_DEPARTMENT_OTHER): Payer: Medicaid Other | Attending: Surgery

## 2015-05-17 DIAGNOSIS — E11622 Type 2 diabetes mellitus with other skin ulcer: Secondary | ICD-10-CM | POA: Diagnosis not present

## 2015-05-17 DIAGNOSIS — Z6841 Body Mass Index (BMI) 40.0 and over, adult: Secondary | ICD-10-CM | POA: Insufficient documentation

## 2015-05-17 DIAGNOSIS — L97211 Non-pressure chronic ulcer of right calf limited to breakdown of skin: Secondary | ICD-10-CM | POA: Diagnosis not present

## 2015-05-17 DIAGNOSIS — Z8673 Personal history of transient ischemic attack (TIA), and cerebral infarction without residual deficits: Secondary | ICD-10-CM | POA: Diagnosis not present

## 2015-05-17 DIAGNOSIS — I509 Heart failure, unspecified: Secondary | ICD-10-CM | POA: Insufficient documentation

## 2015-05-17 DIAGNOSIS — I110 Hypertensive heart disease with heart failure: Secondary | ICD-10-CM | POA: Insufficient documentation

## 2015-05-24 DIAGNOSIS — E11622 Type 2 diabetes mellitus with other skin ulcer: Secondary | ICD-10-CM | POA: Diagnosis not present

## 2015-05-31 DIAGNOSIS — E11622 Type 2 diabetes mellitus with other skin ulcer: Secondary | ICD-10-CM | POA: Diagnosis not present

## 2015-06-07 DIAGNOSIS — E11622 Type 2 diabetes mellitus with other skin ulcer: Secondary | ICD-10-CM | POA: Diagnosis not present

## 2015-06-14 ENCOUNTER — Encounter (HOSPITAL_BASED_OUTPATIENT_CLINIC_OR_DEPARTMENT_OTHER): Payer: Medicaid Other | Attending: Surgery

## 2015-06-14 DIAGNOSIS — I110 Hypertensive heart disease with heart failure: Secondary | ICD-10-CM | POA: Insufficient documentation

## 2015-06-14 DIAGNOSIS — L97211 Non-pressure chronic ulcer of right calf limited to breakdown of skin: Secondary | ICD-10-CM | POA: Diagnosis not present

## 2015-06-14 DIAGNOSIS — E11622 Type 2 diabetes mellitus with other skin ulcer: Secondary | ICD-10-CM | POA: Diagnosis not present

## 2015-06-14 DIAGNOSIS — I509 Heart failure, unspecified: Secondary | ICD-10-CM | POA: Insufficient documentation

## 2015-06-28 DIAGNOSIS — E11622 Type 2 diabetes mellitus with other skin ulcer: Secondary | ICD-10-CM | POA: Diagnosis not present

## 2015-07-12 DIAGNOSIS — E11622 Type 2 diabetes mellitus with other skin ulcer: Secondary | ICD-10-CM | POA: Diagnosis not present

## 2015-07-26 ENCOUNTER — Encounter (HOSPITAL_BASED_OUTPATIENT_CLINIC_OR_DEPARTMENT_OTHER): Payer: Medicaid Other | Attending: Surgery

## 2015-07-26 DIAGNOSIS — E11622 Type 2 diabetes mellitus with other skin ulcer: Secondary | ICD-10-CM | POA: Insufficient documentation

## 2015-07-26 DIAGNOSIS — J45909 Unspecified asthma, uncomplicated: Secondary | ICD-10-CM | POA: Diagnosis not present

## 2015-07-26 DIAGNOSIS — Z8673 Personal history of transient ischemic attack (TIA), and cerebral infarction without residual deficits: Secondary | ICD-10-CM | POA: Insufficient documentation

## 2015-07-26 DIAGNOSIS — I890 Lymphedema, not elsewhere classified: Secondary | ICD-10-CM | POA: Insufficient documentation

## 2015-07-26 DIAGNOSIS — I110 Hypertensive heart disease with heart failure: Secondary | ICD-10-CM | POA: Diagnosis not present

## 2015-07-26 DIAGNOSIS — Z6841 Body Mass Index (BMI) 40.0 and over, adult: Secondary | ICD-10-CM | POA: Insufficient documentation

## 2015-07-26 DIAGNOSIS — I509 Heart failure, unspecified: Secondary | ICD-10-CM | POA: Insufficient documentation

## 2015-07-26 DIAGNOSIS — L97211 Non-pressure chronic ulcer of right calf limited to breakdown of skin: Secondary | ICD-10-CM | POA: Diagnosis not present

## 2015-08-09 DIAGNOSIS — E11622 Type 2 diabetes mellitus with other skin ulcer: Secondary | ICD-10-CM | POA: Diagnosis not present

## 2015-08-12 ENCOUNTER — Ambulatory Visit (HOSPITAL_COMMUNITY)
Admission: EM | Admit: 2015-08-12 | Discharge: 2015-08-12 | Disposition: A | Discharge status: Home / Self Care | Payer: Medicaid Other | Attending: Emergency Medicine | Admitting: Emergency Medicine

## 2015-08-12 ENCOUNTER — Encounter (HOSPITAL_COMMUNITY): Payer: Self-pay | Admitting: *Deleted

## 2015-08-12 DIAGNOSIS — J45901 Unspecified asthma with (acute) exacerbation: Secondary | ICD-10-CM | POA: Diagnosis not present

## 2015-08-12 DIAGNOSIS — J40 Bronchitis, not specified as acute or chronic: Secondary | ICD-10-CM

## 2015-08-12 MED ORDER — IPRATROPIUM-ALBUTEROL 0.5-2.5 (3) MG/3ML IN SOLN
RESPIRATORY_TRACT | Status: AC
  Filled 2015-08-12: qty 3

## 2015-08-12 MED ORDER — ALBUTEROL SULFATE HFA 108 (90 BASE) MCG/ACT IN AERS: 2.0000 | INHALATION_SPRAY | Freq: Four times a day (QID) | RESPIRATORY_TRACT | Status: DC | PRN

## 2015-08-12 MED ORDER — ALBUTEROL SULFATE (2.5 MG/3ML) 0.083% IN NEBU: 2.5000 mg | INHALATION_SOLUTION | Freq: Four times a day (QID) | RESPIRATORY_TRACT | Status: AC | PRN

## 2015-08-12 MED ORDER — ALBUTEROL SULFATE (2.5 MG/3ML) 0.083% IN NEBU: 2.5000 mg | INHALATION_SOLUTION | Freq: Four times a day (QID) | RESPIRATORY_TRACT | Status: DC | PRN

## 2015-08-12 MED ORDER — METHYLPREDNISOLONE ACETATE 80 MG/ML IJ SUSP
80.0000 mg | Freq: Once | INTRAMUSCULAR | Status: AC
  Administered 2015-08-12: 80 mg via INTRAMUSCULAR

## 2015-08-12 MED ORDER — SODIUM CHLORIDE 0.9 % IN NEBU
INHALATION_SOLUTION | RESPIRATORY_TRACT | Status: AC
  Filled 2015-08-12: qty 3

## 2015-08-12 MED ORDER — IPRATROPIUM-ALBUTEROL 0.5-2.5 (3) MG/3ML IN SOLN
3.0000 mL | Freq: Once | RESPIRATORY_TRACT | Status: AC
  Administered 2015-08-12: 3 mL via RESPIRATORY_TRACT

## 2015-08-12 MED ORDER — DOXYCYCLINE HYCLATE 100 MG PO CAPS: 100.0000 mg | ORAL_CAPSULE | Freq: Two times a day (BID) | ORAL | Status: DC

## 2015-08-12 MED ORDER — PREDNISONE 50 MG PO TABS: ORAL_TABLET | ORAL | Status: DC

## 2015-08-12 MED ORDER — METHYLPREDNISOLONE ACETATE 80 MG/ML IJ SUSP
INTRAMUSCULAR | Status: AC
  Filled 2015-08-12: qty 1

## 2015-08-12 NOTE — ED Provider Notes (Signed)
CSN: [id number]     Arrival date & time 08/12/15  1356 History   First MD Initiated Contact with Patient 08/12/15 1423     Chief Complaint  Patient presents with  . Asthma  . Cough   (Consider location/radiation/quality/duration/timing/severity/associated sxs/prior Treatment) HPI  She is a 40 year old woman here with her spouse for cough and asthma. She reports upper respiratory symptoms over the last 2-3 weeks. She describes nasal congestion, runny nose, hoarseness, and cough. Over the last week or so her asthma has been flaring up. She last used her albuterol inhaler nebulizers last night. She denies any shortness of breath or fevers. She does report increased wheezing. She has been taking cough drops, but no other medications.  Her children and husband were sick with similar symptoms the week before hers started.  Past Medical History  Diagnosis Date  . Morbid obesity (HCC)   . Hypertension   . CVA (cerebral infarction) 1990's    "writing is not the same since" R leg weakness   . Asthma   . Type II diabetes mellitus (HCC)   . GERD (gastroesophageal reflux disease)   . Stroke (HCC)   . CHF (congestive heart failure) (HCC)     diastolic   Past Surgical History  Procedure Laterality Date  . Incision and drainage of wound Left 08/30/2013    thigh necrotic wound/notes 08/30/2013  . Irrigation and debridement abscess Left 08/30/2013    Procedure: IRRIGATION AND DEBRIDEMENT ABSCESS Left Thigh Necrotic Wound;  Surgeon: Axel FillerArmando Ramirez, MD;  Location: [facility] OR;  Service: General;  Laterality: Left;  . Tee without cardioversion N/A 09/07/2013    Procedure: TRANSESOPHAGEAL ECHOCARDIOGRAM (TEE);  Surgeon: Chrystie NoseKenneth C. Hilty, MD;  Location: [facility] ENDOSCOPY;  Service: Cardiovascular;  Laterality: N/A;  . Tee without cardioversion N/A 02/08/2015    Procedure: TRANSESOPHAGEAL ECHOCARDIOGRAM (TEE);  Surgeon: Chilton Siiffany Pendleton, MD;  [facility];  Service: Cardiovascular;  Laterality: N/A;   Family  History  Problem Relation Age of Onset  . Diabetes Mother   . Kidney disease Mother    Social History  Substance Use Topics  . Smoking status: Never Smoker   . Smokeless tobacco: Never Used  . Alcohol Use: No   OB History    Gravida Para Term Preterm AB TAB SAB Ectopic Multiple Living   3 3 3  0 0 0 0 0 0 3     Review of Systems As in history of present illness Allergies  Azithromycin  Home Medications   Prior to Admission medications   Medication Sig Start Date End Date Taking? Authorizing Provider  Acetaminophen-Codeine (TYLENOL WITH CODEINE #3 PO) Take by mouth.   Yes Historical Provider, MD  amLODipine (NORVASC) 10 MG tablet Take 1 tablet (10 mg total) by mouth daily. 02/27/15  Yes Rodolph Bonganiel V Thompson, MD  carvedilol (COREG) 25 MG tablet Take 1 tablet (25 mg total) by mouth 2 (two) times daily with a meal. 02/10/15  Yes Costin Otelia SergeantM Gherghe, MD  ferrous sulfate 325 (65 FE) MG tablet Take 1 tablet (325 mg total) by mouth 3 (three) times daily with meals. 02/10/15  Yes Costin Otelia SergeantM Gherghe, MD  furosemide (LASIX) 80 MG tablet Take 2 tablets (160 mg total) by mouth 2 (two) times daily. 02/27/15  Yes Rodolph Bonganiel V Thompson, MD  insulin aspart (NOVOLOG) 100 UNIT/ML injection Inject 0-20 Units into the skin 3 (three) times daily with meals. CBG < 70: implement hypoglycemia protocol CBG 70 - 120: 0 units CBG 121 - 150: 3 units  CBG 151 - 200: 4 units CBG 201 - 250: 7 units CBG 251 - 300: 11 units CBG 301 - 350: 15 units CBG 351 - 400: 20 units 09/09/13  Yes Maryann Mikhail, DO  insulin glargine (LANTUS) 100 UNIT/ML injection Inject 0.3 mLs (30 Units total) into the skin daily. Patient taking differently: Inject 15 Units into the skin daily.  02/27/15  Yes Rodolph Bonganiel V Thompson, MD  isosorbide mononitrate (IMDUR) 30 MG 24 hr tablet Take 1 tablet (30 mg total) by mouth daily. 02/27/15  Yes Rodolph Bonganiel V Thompson, MD  potassium chloride SA (K-DUR,KLOR-CON) 20 MEQ tablet Take 2 tablets (40 mEq total) by mouth  daily. 02/27/15  Yes Rodolph Bonganiel V Thompson, MD  acetaminophen (TYLENOL) 500 MG tablet Take 1,000 mg by mouth every 6 (six) hours as needed for fever.    Historical Provider, MD  albuterol (PROAIR HFA) 108 (90 Base) MCG/ACT inhaler Inhale 2 puffs into the lungs every 6 (six) hours as needed for wheezing or shortness of breath. 08/12/15   Charm RingsErin J Morty Ortwein, MD  albuterol (PROVENTIL) (2.5 MG/3ML) 0.083% nebulizer solution Take 3 mLs (2.5 mg total) by nebulization every 6 (six) hours as needed for wheezing or shortness of breath. 08/12/15   Charm RingsErin J Paula Zietz, MD  doxycycline (VIBRAMYCIN) 100 MG capsule Take 1 capsule (100 mg total) by mouth 2 (two) times daily. 08/12/15   Charm RingsErin J Tajee Savant, MD  predniSONE (DELTASONE) 50 MG tablet Take 1 pill daily for 5 days. Start on 08/13/15. 08/12/15   Charm RingsErin J Himmat Enberg, MD   Meds Ordered and Administered this Visit   Medications  methylPREDNISolone acetate (DEPO-MEDROL) injection 80 mg (80 mg Intramuscular Given 08/12/15 1501)  ipratropium-albuterol (DUONEB) 0.5-2.5 (3) MG/3ML nebulizer solution 3 mL (3 mLs Nebulization Given 08/12/15 1501)    BP 173/91 mmHg  Pulse 90  Temp(Src) 98.3 F (36.8 C) (Oral)  Resp 20  SpO2 98%  LMP 08/07/2015 (Approximate) No data found.   Physical Exam  Constitutional: She is oriented to person, place, and time. She appears well-developed and well-nourished. No distress.  Morbidly obese  HENT:  Mouth/Throat: No oropharyngeal exudate.  Minor erythema of the oropharynx. Nasal mucosa slightly erythematous.  Neck: Neck supple.  Cardiovascular: Normal rate, regular rhythm and normal heart sounds.   No murmur heard. Pulmonary/Chest: Effort normal. No respiratory distress. She has wheezes (scattered expiratory wheezes). She has no rales.  Cough is very coarse sounding  Lymphadenopathy:    She has no cervical adenopathy.  Neurological: She is alert and oriented to person, place, and time.    ED Course  Procedures (including critical care time)  Labs  Review Labs Reviewed - No data to display  Imaging Review No results found.   MDM   1. Bronchitis   2. Asthma exacerbation    Wheezing is completely resolved after nebulizer treatment.  Treat with 5 days of prednisone and regular use of her albuterol. If symptoms are not improving in 2 days, she is to fill the prescription for doxycycline. Follow-up as needed.    Charm RingsErin J Kharson Rasmusson, MD 08/12/15 [phone]

## 2015-08-12 NOTE — ED Notes (Signed)
Per pt & family member: started with cold sxs and productive cough 3 wks ago; has used chloraseptic lozenge - states whenever she takes any OTC cold medicine it exacerbates her asthma.  Has been using her albuterol HFA (last dose last night) and her albuterol neb (last dose last night).  Denies fevers.  Pt is wheelchair-bound.

## 2015-08-12 NOTE — ED Notes (Signed)
Error in prior documentation of breathing.

## 2015-08-12 NOTE — Discharge Instructions (Signed)
The cold you picked up from your kids and husband has flared up your asthma. Take prednisone daily for 5 days, starting tomorrow. Use your albuterol inhaler or nebulizer every 4 hours for the next 2 days. If things are not improving on Monday, fill the prescription for doxycycline. Follow-up as needed.

## 2015-08-23 ENCOUNTER — Encounter (HOSPITAL_BASED_OUTPATIENT_CLINIC_OR_DEPARTMENT_OTHER): Payer: Medicaid Other

## 2015-08-30 ENCOUNTER — Encounter (HOSPITAL_BASED_OUTPATIENT_CLINIC_OR_DEPARTMENT_OTHER): Payer: Medicaid Other | Attending: Surgery

## 2015-08-30 DIAGNOSIS — Z8673 Personal history of transient ischemic attack (TIA), and cerebral infarction without residual deficits: Secondary | ICD-10-CM | POA: Insufficient documentation

## 2015-08-30 DIAGNOSIS — L97211 Non-pressure chronic ulcer of right calf limited to breakdown of skin: Secondary | ICD-10-CM | POA: Insufficient documentation

## 2015-08-30 DIAGNOSIS — E11622 Type 2 diabetes mellitus with other skin ulcer: Secondary | ICD-10-CM | POA: Insufficient documentation

## 2015-08-30 DIAGNOSIS — I110 Hypertensive heart disease with heart failure: Secondary | ICD-10-CM | POA: Insufficient documentation

## 2015-08-30 DIAGNOSIS — J45909 Unspecified asthma, uncomplicated: Secondary | ICD-10-CM | POA: Insufficient documentation

## 2015-08-30 DIAGNOSIS — I890 Lymphedema, not elsewhere classified: Secondary | ICD-10-CM | POA: Insufficient documentation

## 2015-08-30 DIAGNOSIS — Z6841 Body Mass Index (BMI) 40.0 and over, adult: Secondary | ICD-10-CM | POA: Insufficient documentation

## 2015-08-30 DIAGNOSIS — I509 Heart failure, unspecified: Secondary | ICD-10-CM | POA: Insufficient documentation

## 2015-09-06 DIAGNOSIS — Z6841 Body Mass Index (BMI) 40.0 and over, adult: Secondary | ICD-10-CM | POA: Diagnosis not present

## 2015-09-06 DIAGNOSIS — Z8673 Personal history of transient ischemic attack (TIA), and cerebral infarction without residual deficits: Secondary | ICD-10-CM | POA: Diagnosis not present

## 2015-09-06 DIAGNOSIS — L97211 Non-pressure chronic ulcer of right calf limited to breakdown of skin: Secondary | ICD-10-CM | POA: Diagnosis not present

## 2015-09-06 DIAGNOSIS — E11622 Type 2 diabetes mellitus with other skin ulcer: Secondary | ICD-10-CM | POA: Diagnosis present

## 2015-09-06 DIAGNOSIS — J45909 Unspecified asthma, uncomplicated: Secondary | ICD-10-CM | POA: Diagnosis not present

## 2015-09-06 DIAGNOSIS — I890 Lymphedema, not elsewhere classified: Secondary | ICD-10-CM | POA: Diagnosis not present

## 2015-09-06 DIAGNOSIS — I509 Heart failure, unspecified: Secondary | ICD-10-CM | POA: Diagnosis not present

## 2015-09-06 DIAGNOSIS — I110 Hypertensive heart disease with heart failure: Secondary | ICD-10-CM | POA: Diagnosis not present

## 2015-09-13 ENCOUNTER — Encounter (HOSPITAL_BASED_OUTPATIENT_CLINIC_OR_DEPARTMENT_OTHER): Payer: Medicaid Other

## 2015-09-20 ENCOUNTER — Encounter (HOSPITAL_BASED_OUTPATIENT_CLINIC_OR_DEPARTMENT_OTHER): Payer: Medicaid Other | Attending: Surgery

## 2015-09-20 ENCOUNTER — Encounter (HOSPITAL_BASED_OUTPATIENT_CLINIC_OR_DEPARTMENT_OTHER): Payer: Self-pay

## 2015-09-20 DIAGNOSIS — I110 Hypertensive heart disease with heart failure: Secondary | ICD-10-CM | POA: Insufficient documentation

## 2015-09-20 DIAGNOSIS — L97212 Non-pressure chronic ulcer of right calf with fat layer exposed: Secondary | ICD-10-CM | POA: Insufficient documentation

## 2015-09-20 DIAGNOSIS — I509 Heart failure, unspecified: Secondary | ICD-10-CM | POA: Diagnosis not present

## 2015-09-20 DIAGNOSIS — E11622 Type 2 diabetes mellitus with other skin ulcer: Secondary | ICD-10-CM | POA: Diagnosis present

## 2015-09-22 ENCOUNTER — Other Ambulatory Visit: Payer: Self-pay | Admitting: Surgery

## 2015-09-22 ENCOUNTER — Ambulatory Visit (HOSPITAL_COMMUNITY)
Admission: RE | Admit: 2015-09-22 | Discharge: 2015-09-22 | Disposition: A | Discharge status: Home / Self Care | Payer: Medicaid Other | Source: Ambulatory Visit | Attending: Vascular Surgery | Admitting: Vascular Surgery

## 2015-09-22 DIAGNOSIS — R600 Localized edema: Secondary | ICD-10-CM | POA: Diagnosis not present

## 2015-09-22 DIAGNOSIS — I110 Hypertensive heart disease with heart failure: Secondary | ICD-10-CM | POA: Diagnosis not present

## 2015-09-22 DIAGNOSIS — I509 Heart failure, unspecified: Secondary | ICD-10-CM | POA: Diagnosis not present

## 2015-09-22 DIAGNOSIS — K219 Gastro-esophageal reflux disease without esophagitis: Secondary | ICD-10-CM | POA: Diagnosis not present

## 2015-09-22 DIAGNOSIS — L97819 Non-pressure chronic ulcer of other part of right lower leg with unspecified severity: Secondary | ICD-10-CM | POA: Insufficient documentation

## 2015-09-22 DIAGNOSIS — L97919 Non-pressure chronic ulcer of unspecified part of right lower leg with unspecified severity: Secondary | ICD-10-CM

## 2015-09-22 DIAGNOSIS — E119 Type 2 diabetes mellitus without complications: Secondary | ICD-10-CM | POA: Diagnosis not present

## 2015-09-22 DIAGNOSIS — R938 Abnormal findings on diagnostic imaging of other specified body structures: Secondary | ICD-10-CM | POA: Diagnosis not present

## 2015-09-25 ENCOUNTER — Encounter (HOSPITAL_COMMUNITY): Payer: Medicaid Other

## 2015-09-26 ENCOUNTER — Ambulatory Visit (HOSPITAL_COMMUNITY)
Admission: RE | Admit: 2015-09-26 | Discharge: 2015-09-26 | Disposition: A | Discharge status: Home / Self Care | Payer: Medicaid Other | Source: Ambulatory Visit | Attending: Vascular Surgery | Admitting: Vascular Surgery

## 2015-09-26 ENCOUNTER — Other Ambulatory Visit: Payer: Self-pay | Admitting: Surgery

## 2015-09-26 DIAGNOSIS — I509 Heart failure, unspecified: Secondary | ICD-10-CM | POA: Insufficient documentation

## 2015-09-26 DIAGNOSIS — L97911 Non-pressure chronic ulcer of unspecified part of right lower leg limited to breakdown of skin: Secondary | ICD-10-CM | POA: Diagnosis not present

## 2015-09-26 DIAGNOSIS — E119 Type 2 diabetes mellitus without complications: Secondary | ICD-10-CM | POA: Insufficient documentation

## 2015-09-26 DIAGNOSIS — I110 Hypertensive heart disease with heart failure: Secondary | ICD-10-CM | POA: Diagnosis not present

## 2015-09-26 DIAGNOSIS — K219 Gastro-esophageal reflux disease without esophagitis: Secondary | ICD-10-CM | POA: Insufficient documentation

## 2015-10-04 DIAGNOSIS — E11622 Type 2 diabetes mellitus with other skin ulcer: Secondary | ICD-10-CM | POA: Diagnosis not present

## 2015-10-18 ENCOUNTER — Encounter (HOSPITAL_BASED_OUTPATIENT_CLINIC_OR_DEPARTMENT_OTHER): Payer: Medicaid Other | Attending: Surgery

## 2015-10-18 DIAGNOSIS — E1151 Type 2 diabetes mellitus with diabetic peripheral angiopathy without gangrene: Secondary | ICD-10-CM | POA: Insufficient documentation

## 2015-10-18 DIAGNOSIS — L97211 Non-pressure chronic ulcer of right calf limited to breakdown of skin: Secondary | ICD-10-CM | POA: Insufficient documentation

## 2015-10-18 DIAGNOSIS — I110 Hypertensive heart disease with heart failure: Secondary | ICD-10-CM | POA: Insufficient documentation

## 2015-10-18 DIAGNOSIS — I509 Heart failure, unspecified: Secondary | ICD-10-CM | POA: Insufficient documentation

## 2015-10-18 DIAGNOSIS — Z8673 Personal history of transient ischemic attack (TIA), and cerebral infarction without residual deficits: Secondary | ICD-10-CM | POA: Insufficient documentation

## 2015-10-18 DIAGNOSIS — Z6841 Body Mass Index (BMI) 40.0 and over, adult: Secondary | ICD-10-CM | POA: Insufficient documentation

## 2015-10-18 DIAGNOSIS — E11622 Type 2 diabetes mellitus with other skin ulcer: Secondary | ICD-10-CM | POA: Insufficient documentation

## 2015-10-18 DIAGNOSIS — J45909 Unspecified asthma, uncomplicated: Secondary | ICD-10-CM | POA: Insufficient documentation

## 2015-10-18 DIAGNOSIS — I890 Lymphedema, not elsewhere classified: Secondary | ICD-10-CM | POA: Insufficient documentation

## 2015-10-18 DIAGNOSIS — L97511 Non-pressure chronic ulcer of other part of right foot limited to breakdown of skin: Secondary | ICD-10-CM | POA: Insufficient documentation

## 2015-10-18 DIAGNOSIS — L97411 Non-pressure chronic ulcer of right heel and midfoot limited to breakdown of skin: Secondary | ICD-10-CM | POA: Insufficient documentation

## 2015-11-01 DIAGNOSIS — Z8673 Personal history of transient ischemic attack (TIA), and cerebral infarction without residual deficits: Secondary | ICD-10-CM | POA: Diagnosis not present

## 2015-11-01 DIAGNOSIS — E11622 Type 2 diabetes mellitus with other skin ulcer: Secondary | ICD-10-CM | POA: Diagnosis present

## 2015-11-01 DIAGNOSIS — L97411 Non-pressure chronic ulcer of right heel and midfoot limited to breakdown of skin: Secondary | ICD-10-CM | POA: Diagnosis not present

## 2015-11-01 DIAGNOSIS — E1151 Type 2 diabetes mellitus with diabetic peripheral angiopathy without gangrene: Secondary | ICD-10-CM | POA: Diagnosis not present

## 2015-11-01 DIAGNOSIS — I509 Heart failure, unspecified: Secondary | ICD-10-CM | POA: Diagnosis not present

## 2015-11-01 DIAGNOSIS — I110 Hypertensive heart disease with heart failure: Secondary | ICD-10-CM | POA: Diagnosis not present

## 2015-11-01 DIAGNOSIS — J45909 Unspecified asthma, uncomplicated: Secondary | ICD-10-CM | POA: Diagnosis not present

## 2015-11-01 DIAGNOSIS — L97511 Non-pressure chronic ulcer of other part of right foot limited to breakdown of skin: Secondary | ICD-10-CM | POA: Diagnosis not present

## 2015-11-01 DIAGNOSIS — Z6841 Body Mass Index (BMI) 40.0 and over, adult: Secondary | ICD-10-CM | POA: Diagnosis not present

## 2015-11-01 DIAGNOSIS — L97211 Non-pressure chronic ulcer of right calf limited to breakdown of skin: Secondary | ICD-10-CM | POA: Diagnosis not present

## 2015-11-01 DIAGNOSIS — I890 Lymphedema, not elsewhere classified: Secondary | ICD-10-CM | POA: Diagnosis not present

## 2015-11-15 ENCOUNTER — Encounter (HOSPITAL_BASED_OUTPATIENT_CLINIC_OR_DEPARTMENT_OTHER): Payer: Medicaid Other | Attending: Surgery

## 2015-11-15 DIAGNOSIS — E1151 Type 2 diabetes mellitus with diabetic peripheral angiopathy without gangrene: Secondary | ICD-10-CM | POA: Insufficient documentation

## 2015-11-15 DIAGNOSIS — E11622 Type 2 diabetes mellitus with other skin ulcer: Secondary | ICD-10-CM | POA: Insufficient documentation

## 2015-11-15 DIAGNOSIS — I509 Heart failure, unspecified: Secondary | ICD-10-CM | POA: Insufficient documentation

## 2015-11-15 DIAGNOSIS — Z6841 Body Mass Index (BMI) 40.0 and over, adult: Secondary | ICD-10-CM | POA: Insufficient documentation

## 2015-11-15 DIAGNOSIS — I110 Hypertensive heart disease with heart failure: Secondary | ICD-10-CM | POA: Insufficient documentation

## 2015-11-15 DIAGNOSIS — L97211 Non-pressure chronic ulcer of right calf limited to breakdown of skin: Secondary | ICD-10-CM | POA: Insufficient documentation

## 2015-11-15 DIAGNOSIS — I890 Lymphedema, not elsewhere classified: Secondary | ICD-10-CM | POA: Insufficient documentation

## 2015-11-22 DIAGNOSIS — E1151 Type 2 diabetes mellitus with diabetic peripheral angiopathy without gangrene: Secondary | ICD-10-CM | POA: Diagnosis not present

## 2015-11-22 DIAGNOSIS — E11622 Type 2 diabetes mellitus with other skin ulcer: Secondary | ICD-10-CM | POA: Diagnosis present

## 2015-11-22 DIAGNOSIS — I509 Heart failure, unspecified: Secondary | ICD-10-CM | POA: Diagnosis not present

## 2015-11-22 DIAGNOSIS — L97211 Non-pressure chronic ulcer of right calf limited to breakdown of skin: Secondary | ICD-10-CM | POA: Diagnosis not present

## 2015-11-22 DIAGNOSIS — Z6841 Body Mass Index (BMI) 40.0 and over, adult: Secondary | ICD-10-CM | POA: Diagnosis not present

## 2015-11-22 DIAGNOSIS — I110 Hypertensive heart disease with heart failure: Secondary | ICD-10-CM | POA: Diagnosis not present

## 2015-11-22 DIAGNOSIS — I890 Lymphedema, not elsewhere classified: Secondary | ICD-10-CM | POA: Diagnosis not present

## 2015-12-07 ENCOUNTER — Other Ambulatory Visit: Payer: Self-pay

## 2015-12-07 ENCOUNTER — Emergency Department (HOSPITAL_COMMUNITY): Payer: Medicaid Other

## 2015-12-07 ENCOUNTER — Inpatient Hospital Stay (HOSPITAL_COMMUNITY)
Admission: EM | Admit: 2015-12-07 | Discharge: 2015-12-15 | DRG: 291 | Disposition: A | Discharge status: Home Health | Payer: Medicaid Other | Attending: Family Medicine | Admitting: Family Medicine

## 2015-12-07 ENCOUNTER — Encounter (HOSPITAL_COMMUNITY): Payer: Self-pay | Admitting: Emergency Medicine

## 2015-12-07 DIAGNOSIS — Z7401 Bed confinement status: Secondary | ICD-10-CM

## 2015-12-07 DIAGNOSIS — I5023 Acute on chronic systolic (congestive) heart failure: Secondary | ICD-10-CM

## 2015-12-07 DIAGNOSIS — L02419 Cutaneous abscess of limb, unspecified: Secondary | ICD-10-CM

## 2015-12-07 DIAGNOSIS — Z841 Family history of disorders of kidney and ureter: Secondary | ICD-10-CM

## 2015-12-07 DIAGNOSIS — N179 Acute kidney failure, unspecified: Secondary | ICD-10-CM | POA: Diagnosis not present

## 2015-12-07 DIAGNOSIS — E11622 Type 2 diabetes mellitus with other skin ulcer: Secondary | ICD-10-CM | POA: Diagnosis present

## 2015-12-07 DIAGNOSIS — L02416 Cutaneous abscess of left lower limb: Secondary | ICD-10-CM | POA: Diagnosis present

## 2015-12-07 DIAGNOSIS — D72820 Lymphocytosis (symptomatic): Secondary | ICD-10-CM | POA: Diagnosis present

## 2015-12-07 DIAGNOSIS — I890 Lymphedema, not elsewhere classified: Secondary | ICD-10-CM | POA: Diagnosis present

## 2015-12-07 DIAGNOSIS — R0600 Dyspnea, unspecified: Secondary | ICD-10-CM | POA: Diagnosis not present

## 2015-12-07 DIAGNOSIS — E872 Acidosis: Secondary | ICD-10-CM | POA: Diagnosis present

## 2015-12-07 DIAGNOSIS — R601 Generalized edema: Secondary | ICD-10-CM | POA: Diagnosis not present

## 2015-12-07 DIAGNOSIS — I872 Venous insufficiency (chronic) (peripheral): Secondary | ICD-10-CM

## 2015-12-07 DIAGNOSIS — K219 Gastro-esophageal reflux disease without esophagitis: Secondary | ICD-10-CM | POA: Diagnosis present

## 2015-12-07 DIAGNOSIS — I130 Hypertensive heart and chronic kidney disease with heart failure and stage 1 through stage 4 chronic kidney disease, or unspecified chronic kidney disease: Principal | ICD-10-CM | POA: Diagnosis present

## 2015-12-07 DIAGNOSIS — E118 Type 2 diabetes mellitus with unspecified complications: Secondary | ICD-10-CM

## 2015-12-07 DIAGNOSIS — Z794 Long term (current) use of insulin: Secondary | ICD-10-CM

## 2015-12-07 DIAGNOSIS — N184 Chronic kidney disease, stage 4 (severe): Secondary | ICD-10-CM | POA: Diagnosis present

## 2015-12-07 DIAGNOSIS — IMO0002 Reserved for concepts with insufficient information to code with codable children: Secondary | ICD-10-CM

## 2015-12-07 DIAGNOSIS — D649 Anemia, unspecified: Secondary | ICD-10-CM

## 2015-12-07 DIAGNOSIS — I5043 Acute on chronic combined systolic (congestive) and diastolic (congestive) heart failure: Secondary | ICD-10-CM | POA: Diagnosis present

## 2015-12-07 DIAGNOSIS — I6930 Unspecified sequelae of cerebral infarction: Secondary | ICD-10-CM

## 2015-12-07 DIAGNOSIS — E662 Morbid (severe) obesity with alveolar hypoventilation: Secondary | ICD-10-CM | POA: Diagnosis present

## 2015-12-07 DIAGNOSIS — J45909 Unspecified asthma, uncomplicated: Secondary | ICD-10-CM | POA: Diagnosis not present

## 2015-12-07 DIAGNOSIS — R5381 Other malaise: Secondary | ICD-10-CM

## 2015-12-07 DIAGNOSIS — Z833 Family history of diabetes mellitus: Secondary | ICD-10-CM

## 2015-12-07 DIAGNOSIS — F40240 Claustrophobia: Secondary | ICD-10-CM | POA: Diagnosis present

## 2015-12-07 DIAGNOSIS — D638 Anemia in other chronic diseases classified elsewhere: Secondary | ICD-10-CM | POA: Diagnosis present

## 2015-12-07 DIAGNOSIS — E1165 Type 2 diabetes mellitus with hyperglycemia: Secondary | ICD-10-CM | POA: Diagnosis present

## 2015-12-07 DIAGNOSIS — I69351 Hemiplegia and hemiparesis following cerebral infarction affecting right dominant side: Secondary | ICD-10-CM

## 2015-12-07 DIAGNOSIS — E1122 Type 2 diabetes mellitus with diabetic chronic kidney disease: Secondary | ICD-10-CM | POA: Diagnosis present

## 2015-12-07 DIAGNOSIS — I10 Essential (primary) hypertension: Secondary | ICD-10-CM | POA: Diagnosis present

## 2015-12-07 DIAGNOSIS — E1121 Type 2 diabetes mellitus with diabetic nephropathy: Secondary | ICD-10-CM | POA: Diagnosis present

## 2015-12-07 DIAGNOSIS — Z6841 Body Mass Index (BMI) 40.0 and over, adult: Secondary | ICD-10-CM

## 2015-12-07 DIAGNOSIS — L97219 Non-pressure chronic ulcer of right calf with unspecified severity: Secondary | ICD-10-CM | POA: Diagnosis present

## 2015-12-07 LAB — MAGNESIUM: Magnesium: 2 mg/dL (ref 1.7–2.4)

## 2015-12-07 LAB — CBC
HCT: 25.1 % — ABNORMAL LOW (ref 36.0–46.0)
HCT: 24 % — ABNORMAL LOW (ref 36.0–46.0)
HEMOGLOBIN: 8 g/dL — AB (ref 12.0–15.0)
Hemoglobin: 7.7 g/dL — ABNORMAL LOW (ref 12.0–15.0)
MCH: 27.5 pg (ref 26.0–34.0)
MCH: 27 pg (ref 26.0–34.0)
MCHC: 31.9 g/dL (ref 30.0–36.0)
MCHC: 32.1 g/dL (ref 30.0–36.0)
MCV: 86.3 fL (ref 78.0–100.0)
MCV: 84.2 fL (ref 78.0–100.0)
PLATELETS: 288 10*3/uL (ref 150–400)
PLATELETS: 317 10*3/uL (ref 150–400)
RBC: 2.91 MIL/uL — ABNORMAL LOW (ref 3.87–5.11)
RBC: 2.85 MIL/uL — ABNORMAL LOW (ref 3.87–5.11)
RDW: 15.5 % (ref 11.5–15.5)
RDW: 15.2 % (ref 11.5–15.5)
WBC: 15.3 10*3/uL — ABNORMAL HIGH (ref 4.0–10.5)
WBC: 14.2 10*3/uL — ABNORMAL HIGH (ref 4.0–10.5)

## 2015-12-07 LAB — BASIC METABOLIC PANEL
ANION GAP: 10 (ref 5–15)
BUN: 49 mg/dL — AB (ref 6–20)
CALCIUM: 8.6 mg/dL — AB (ref 8.9–10.3)
CO2: 17 mmol/L — ABNORMAL LOW (ref 22–32)
CREATININE: 5.49 mg/dL — AB (ref 0.44–1.00)
Chloride: 110 mmol/L (ref 101–111)
GFR calc Af Amer: 10 mL/min — ABNORMAL LOW (ref >60)
GFR, EST NON AFRICAN AMERICAN: 9 mL/min — AB (ref >60)
GLUCOSE: 130 mg/dL — AB (ref 65–99)
Potassium: 4.6 mmol/L (ref 3.5–5.1)
Sodium: 137 mmol/L (ref 135–145)

## 2015-12-07 LAB — GLUCOSE, CAPILLARY
Glucose-Capillary: 128 mg/dL — ABNORMAL HIGH (ref 65–99)
Glucose-Capillary: 126 mg/dL — ABNORMAL HIGH (ref 65–99)

## 2015-12-07 LAB — CREATININE, SERUM
CREATININE: 5.56 mg/dL — AB (ref 0.44–1.00)
GFR calc Af Amer: 10 mL/min — ABNORMAL LOW (ref >60)
GFR calc non Af Amer: 9 mL/min — ABNORMAL LOW (ref >60)

## 2015-12-07 LAB — I-STAT CG4 LACTIC ACID, ED: Lactic Acid, Venous: 0.73 mmol/L (ref 0.5–1.9)

## 2015-12-07 LAB — I-STAT TROPONIN, ED: TROPONIN I, POC: 0.02 ng/mL (ref 0.00–0.08)

## 2015-12-07 LAB — IRON AND TIBC
Iron: 16 ug/dL — ABNORMAL LOW (ref 28–170)
SATURATION RATIOS: 12 % (ref 10.4–31.8)
TIBC: 129 ug/dL — AB (ref 250–450)
UIBC: 113 ug/dL

## 2015-12-07 LAB — LACTIC ACID, PLASMA
LACTIC ACID, VENOUS: 1 mmol/L (ref 0.5–1.9)
LACTIC ACID, VENOUS: 0.9 mmol/L (ref 0.5–1.9)

## 2015-12-07 LAB — INFLUENZA PANEL BY PCR (TYPE A & B)
H1N1 flu by pcr: NOT DETECTED
Influenza A By PCR: NEGATIVE
Influenza B By PCR: NEGATIVE

## 2015-12-07 LAB — FERRITIN: Ferritin: 711 ng/mL — ABNORMAL HIGH (ref 11–307)

## 2015-12-07 LAB — TROPONIN I
Troponin I: 0.04 ng/mL (ref <0.03)
Troponin I: 0.19 ng/mL (ref <0.03)

## 2015-12-07 LAB — BRAIN NATRIURETIC PEPTIDE: B Natriuretic Peptide: 375.4 pg/mL — ABNORMAL HIGH (ref 0.0–100.0)

## 2015-12-07 LAB — PHOSPHORUS: PHOSPHORUS: 4.6 mg/dL (ref 2.5–4.6)

## 2015-12-07 MED ORDER — ACETAMINOPHEN 650 MG RE SUPP: 650.0000 mg | Freq: Four times a day (QID) | RECTAL | Status: DC | PRN

## 2015-12-07 MED ORDER — CEFTRIAXONE SODIUM 1 G IJ SOLR
1.0000 g | INTRAMUSCULAR | Status: DC
  Administered 2015-12-07: 1 g via INTRAVENOUS
  Filled 2015-12-07 (×2): qty 10

## 2015-12-07 MED ORDER — HYDRALAZINE HCL 20 MG/ML IJ SOLN
10.0000 mg | Freq: Three times a day (TID) | INTRAMUSCULAR | Status: DC | PRN
  Administered 2015-12-08 (×2): 10 mg via INTRAVENOUS
  Filled 2015-12-07 (×3): qty 1

## 2015-12-07 MED ORDER — HEPARIN SODIUM (PORCINE) 5000 UNIT/ML IJ SOLN
5000.0000 [IU] | Freq: Three times a day (TID) | INTRAMUSCULAR | Status: DC
  Administered 2015-12-07 – 2015-12-15 (×22): 5000 [IU] via SUBCUTANEOUS
  Filled 2015-12-07 (×23): qty 1

## 2015-12-07 MED ORDER — SODIUM CHLORIDE 0.9 % IV SOLN
INTRAVENOUS | Status: DC
  Administered 2015-12-07 (×2): via INTRAVENOUS

## 2015-12-07 MED ORDER — IPRATROPIUM-ALBUTEROL 0.5-2.5 (3) MG/3ML IN SOLN
3.0000 mL | Freq: Four times a day (QID) | RESPIRATORY_TRACT | Status: DC
  Administered 2015-12-07: 3 mL via RESPIRATORY_TRACT
  Filled 2015-12-07: qty 3

## 2015-12-07 MED ORDER — ACETAMINOPHEN 325 MG PO TABS
650.0000 mg | ORAL_TABLET | Freq: Once | ORAL | Status: AC
  Administered 2015-12-07: 650 mg via ORAL
  Filled 2015-12-07: qty 2

## 2015-12-07 MED ORDER — FUROSEMIDE 10 MG/ML IJ SOLN
160.0000 mg | Freq: Two times a day (BID) | INTRAVENOUS | Status: DC
  Administered 2015-12-07 – 2015-12-10 (×6): 160 mg via INTRAVENOUS
  Filled 2015-12-07 (×7): qty 16

## 2015-12-07 MED ORDER — IPRATROPIUM-ALBUTEROL 0.5-2.5 (3) MG/3ML IN SOLN
3.0000 mL | Freq: Three times a day (TID) | RESPIRATORY_TRACT | Status: DC
  Administered 2015-12-08 – 2015-12-09 (×4): 3 mL via RESPIRATORY_TRACT
  Filled 2015-12-07 (×6): qty 3

## 2015-12-07 MED ORDER — ONDANSETRON HCL 4 MG PO TABS
4.0000 mg | ORAL_TABLET | Freq: Four times a day (QID) | ORAL | Status: DC | PRN
  Administered 2015-12-09: 4 mg via ORAL
  Filled 2015-12-07: qty 1

## 2015-12-07 MED ORDER — IPRATROPIUM-ALBUTEROL 0.5-2.5 (3) MG/3ML IN SOLN: 3.0000 mL | Freq: Four times a day (QID) | RESPIRATORY_TRACT | Status: DC

## 2015-12-07 MED ORDER — ACETAMINOPHEN 325 MG PO TABS
650.0000 mg | ORAL_TABLET | Freq: Four times a day (QID) | ORAL | Status: DC | PRN
  Administered 2015-12-08 – 2015-12-12 (×6): 650 mg via ORAL
  Filled 2015-12-07 (×6): qty 2

## 2015-12-07 MED ORDER — ALBUTEROL SULFATE (2.5 MG/3ML) 0.083% IN NEBU
5.0000 mg | INHALATION_SOLUTION | Freq: Once | RESPIRATORY_TRACT | Status: AC
  Administered 2015-12-07: 5 mg via RESPIRATORY_TRACT
  Filled 2015-12-07: qty 6

## 2015-12-07 MED ORDER — ALBUTEROL SULFATE (2.5 MG/3ML) 0.083% IN NEBU
2.5000 mg | INHALATION_SOLUTION | RESPIRATORY_TRACT | Status: DC | PRN
  Administered 2015-12-08 – 2015-12-11 (×2): 2.5 mg via RESPIRATORY_TRACT
  Filled 2015-12-07 (×2): qty 3

## 2015-12-07 MED ORDER — FUROSEMIDE 10 MG/ML IJ SOLN
80.0000 mg | Freq: Once | INTRAMUSCULAR | Status: AC
  Administered 2015-12-08: 80 mg via INTRAVENOUS
  Filled 2015-12-07: qty 8

## 2015-12-07 MED ORDER — INSULIN ASPART 100 UNIT/ML ~~LOC~~ SOLN
0.0000 [IU] | Freq: Three times a day (TID) | SUBCUTANEOUS | Status: DC
  Administered 2015-12-07 – 2015-12-11 (×8): 3 [IU] via SUBCUTANEOUS
  Administered 2015-12-13: 4 [IU] via SUBCUTANEOUS
  Administered 2015-12-13: 3 [IU] via SUBCUTANEOUS

## 2015-12-07 MED ORDER — ONDANSETRON HCL 4 MG/2ML IJ SOLN
4.0000 mg | Freq: Four times a day (QID) | INTRAMUSCULAR | Status: DC | PRN
  Administered 2015-12-07 – 2015-12-08 (×3): 4 mg via INTRAVENOUS
  Filled 2015-12-07 (×3): qty 2

## 2015-12-07 MED ORDER — IPRATROPIUM BROMIDE 0.02 % IN SOLN
0.5000 mg | Freq: Once | RESPIRATORY_TRACT | Status: AC
  Administered 2015-12-07: 0.5 mg via RESPIRATORY_TRACT
  Filled 2015-12-07: qty 2.5

## 2015-12-07 NOTE — ED Notes (Signed)
Help pull patient up in bed

## 2015-12-07 NOTE — ED Triage Notes (Signed)
Per EMS, pt from home. Hx of CHF, Asthma.  Pt complaining of SOB x 24 hrs. Albuterol treatment last night but still woke up this morning with SOB.  5mg  of Albuterol given en route. No IV. Pt has a wound to lower right leg. Bandage changed on Monday and wednesdays by wound care nurse. Complaining of diarrhea and fever at home.  Edema on right side.  Sinus Tach on EKG. 180/92, 110 HR, 98% RA.  Wheezing bilaterally.  No cough.

## 2015-12-07 NOTE — Consult Note (Signed)
Linda DykesDavenia McKinnon [name] is an 40 y.o. female referred by Dr Melynda RippleHobbs   Chief Complaint: Acute on CKD 4 HPI: 40yo BF admitted thru ER for SOB.  Has CKD 4 followed by Dr Marisue HumbleSanford in our office and last seen 4/17 at which time Scr was 2.6.   Scr 2.73 in 04/29/15 and today 5.49.  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 Sp[place] some time in [place] but now back in [place].  She is minimally ambulatory and uses a wheelchair mostly to move around.  Appetite decreased in last 2 days.  Past Medical History:  Diagnosis Date  . Asthma   . CHF (congestive heart failure) (HCC)    diastolic  . CVA (cerebral infarction) 1990's   "writing is not the same since" R leg weakness   . GERD (gastroesophageal reflux disease)   . Hypertension   . Morbid obesity (HCC)   . Stroke (HCC)   . Type II diabetes mellitus (HCC)     Past Surgical History:  Procedure Laterality Date  . INCISION AND DRAINAGE OF WOUND Left 08/30/2013   thigh necrotic wound/notes 08/30/2013  . IRRIGATION AND DEBRIDEMENT ABSCESS Left 08/30/2013   Procedure: IRRIGATION AND DEBRIDEMENT ABSCESS Left Thigh Necrotic Wound;  Surgeon: Axel FillerArmando Ramirez, MD;  Location: [facility] OR;  Service: General;  Laterality: Left;  . TEE WITHOUT CARDIOVERSION N/A 09/07/2013   Procedure: TRANSESOPHAGEAL ECHOCARDIOGRAM (TEE);  Surgeon: Chrystie NoseKenneth C. Hilty, MD;  [facility];  Service: Cardiovascular;  Laterality: N/A;  . TEE WITHOUT CARDIOVERSION N/A 02/08/2015   Procedure: TRANSESOPHAGEAL ECHOCARDIOGRAM (TEE);  Surgeon: Chilton Siiffany [place], MD;  [facility];  Service: Cardiovascular;  Laterality: N/A;    Family History  Problem Relation Age of Onset  . Diabetes Mother   . Kidney disease Mother    Social History:  reports that she has never smoked. She has never used smokeless tobacco. She reports that she does not drink alcohol or use drugs.  Allergies:  Allergies  Allergen Reactions  . Azithromycin Other (See Comments)     Nose bleeding event    Medications Prior to Admission  Medication Sig Dispense Refill  . albuterol (PROAIR HFA) 108 (90 Base) MCG/ACT inhaler Inhale 2 puffs into the lungs every 6 (six) hours as needed for wheezing or shortness of breath. 1 Inhaler 0  . albuterol (PROVENTIL) (2.5 MG/3ML) 0.083% nebulizer solution Take 3 mLs (2.5 mg total) by nebulization every 6 (six) hours as needed for wheezing or shortness of breath. 75 mL 0  . amLODipine (NORVASC) 10 MG tablet Take 1 tablet (10 mg total) by mouth daily. 30 tablet 0  . carvedilol (COREG) 25 MG tablet Take 1 tablet (25 mg total) by mouth 2 (two) times daily with a meal. 60 tablet 1  . ferrous sulfate 325 (65 FE) MG tablet Take 1 tablet (325 mg total) by mouth 3 (three) times daily with meals. 90 tablet 3  . insulin glargine (LANTUS) 100 UNIT/ML injection Inject 0.3 mLs (30 Units total) into the skin daily. (Patient taking differently: Inject 15 Units into the skin daily. ) 10 mL 3  . isosorbide mononitrate (IMDUR) 30 MG 24 hr tablet Take 1 tablet (30 mg total) by mouth daily. 30 tablet 3  . potassium chloride SA (K-DUR,KLOR-CON) 20 MEQ tablet Take 2 tablets (40 mEq total) by mouth daily. 30 tablet 0  . acetaminophen (TYLENOL) 500 MG tablet Take 1,000 mg by mouth every 6 (six) hours as needed for fever.    .Marland Kitchen  Acetaminophen-Codeine (TYLENOL WITH CODEINE #3 PO) Take by mouth.    . doxycycline (VIBRAMYCIN) 100 MG capsule Take 1 capsule (100 mg total) by mouth 2 (two) times daily. (Patient not taking: Reported on 12/07/2015) 20 capsule 0  . furosemide (LASIX) 80 MG tablet Take 2 tablets (160 mg total) by mouth 2 (two) times daily. 120 tablet 0  . insulin aspart (NOVOLOG) 100 UNIT/ML injection Inject 0-20 Units into the skin 3 (three) times daily with meals. CBG < 70: implement hypoglycemia protocol CBG 70 - 120: 0 units CBG 121 - 150: 3 units CBG 151 - 200: 4 units CBG 201 - 250: 7 units CBG 251 - 300: 11 units CBG 301 - 350: 15 units CBG 351 -  400: 20 units    . predniSONE (DELTASONE) 50 MG tablet Take 1 pill daily for 5 days. Start on 08/13/15. 5 tablet 0     Lab Results: UA: ND  Recent Labs  12/07/15 1119  WBC 15.3*  HGB 8.0*  HCT 25.1*  PLT 288   BMET  Recent Labs  12/07/15 1119  NA 137  K 4.6  CL 110  CO2 17*  GLUCOSE 130*  BUN 49*  CREATININE 5.49*  CALCIUM 8.6*   LFT No results for input(s): PROT, ALBUMIN, AST, ALT, ALKPHOS, BILITOT, BILIDIR, IBILI in the last 72 hours. Dg Chest 2 View  Result Date: 12/07/2015 CLINICAL DATA:  Persistent shortness of breath for 3 days. Hypertension, CHF, diabetes EXAM: CHEST  2 VIEW COMPARISON:  02/20/2015 FINDINGS: Limited exam because of obese body habitus. Cardiomegaly evident with vascular congestion centrally. Low lung volumes noted. No definite focal pneumonia, collapse or consolidation. No current edema, large effusion or pneumothorax. Trachea is midline. Lateral view is limited because of underexposure. IMPRESSION: Cardiomegaly with vascular congestion and low lung volumes. No current significant CHF or pneumonia. Electronically Signed   By: Judie Petit.  Shick M.D.   On: 12/07/2015 10:45    ROS: Appetite decreased last 2 d SOB better now No CP No Abd pain Chronic pain in both legs No dysuria  PHYSICAL EXAM: Blood pressure (!) 153/96, pulse 98, temperature 98.1 F (36.7 C), temperature source Oral, resp. rate (!) 22, height 5\' 5"  (1.651 m), weight (!) 158.8 kg (350 lb), SpO2 99 %. HEENT: PERRLA EOMI NECK:Obese too difficult to see JVD LUNGS:Decreased BS bases but clear CARDIAC:RRR wo MRG ABD:+ BS Obese, NT ND no overt organomegaly, SQ edema abd wall EXT:3-4+ edema legs, chronic venous stasis changes rt>lt.  Healing ulcer post Rt calf with pink granulation tissue NEURO:CNI Ox3 no asterixis  Assessment: 1. ? 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 Volume overload 3. Hx HTN 4. Hx DM 5. anemia PLAN: 1. IV lasix 2. Renal diet 3. Check PO4 and  PTH 4. Check iron studies, may need to start ESA if they are OK 5. Dialysis will be a difficult endeavor 6. Daily labs   Tien Aispuro T 12/07/2015, 4:31 PM

## 2015-12-07 NOTE — ED Notes (Signed)
Attempted report 

## 2015-12-07 NOTE — H&P (Addendum)
[facility] History and Physical  [name] ONG:[id number] DOB: 13-Aug-1975 DOA: 12/07/2015  Referring physician:  PCP: [name], MD   Chief Complaint: "I couldn't eat."  HPI: [name] is a 40 y.o. female past medical history significant for asthma, congestive heart failure, CVA, hypertension, diabetes and morbid obesity. Her chief complaint of weakness and decreased appetite. Per patient hasn't patient has had decreased appetite and weakness that had worse over the last 7 days. Patient also has had upper respiratory tract infection symptoms. They have not sought any medical care for this patient has had a fever at home up to 103 taken orally. What brought patient Into the ED is the patient got so weak she couldn't even transfer out of a chair.  Denies any vomiting, diarrhea, rash.  ED course: In the emergency room provider thought patient was fluid overloaded and gave her dose of Lasix. Patient also got a breathing treatment by EMS and another subsequent treatment in the emergency room for her shortness of breath. Chest x-ray showed venous congestion.  Review of Systems:  As per HPI otherwise 10 point review of systems negative.    Past Medical History:  Diagnosis Date  . Asthma   . CHF (congestive heart failure) (HCC)    diastolic  . CVA (cerebral infarction) 1990's   "writing is not the same since" R leg weakness   . GERD (gastroesophageal reflux disease)   . Hypertension   . Morbid obesity (HCC)   . Stroke (HCC)   . Type II diabetes mellitus (HCC)    Past Surgical History:  Procedure Laterality Date  . INCISION AND DRAINAGE OF WOUND Left 08/30/2013   thigh necrotic wound/notes 08/30/2013  . IRRIGATION AND DEBRIDEMENT ABSCESS Left 08/30/2013   Procedure: IRRIGATION AND DEBRIDEMENT ABSCESS Left Thigh Necrotic Wound;  Surgeon: [name], MD;  Location: [facility] OR;  Service: General;  Laterality: Left;  . TEE WITHOUT CARDIOVERSION N/A  09/07/2013   Procedure: TRANSESOPHAGEAL ECHOCARDIOGRAM (TEE);  Surgeon: [name], MD;  Location: [facility] ENDOSCOPY;  Service: Cardiovascular;  Laterality: N/A;  . TEE WITHOUT CARDIOVERSION N/A 02/08/2015   Procedure: TRANSESOPHAGEAL ECHOCARDIOGRAM (TEE);  Surgeon: Chilton Si, MD;  Location: [facility] ENDOSCOPY;  Service: Cardiovascular;  Laterality: N/A;   Social History:  reports that she has never smoked. She has never used smokeless tobacco. She reports that she does not drink alcohol or use drugs.  Allergies  Allergen Reactions  . Azithromycin Other (See Comments)    Nose bleeding event    Family History  Problem Relation Age of Onset  . Diabetes Mother   . Kidney disease Mother      Prior to Admission medications   Medication Sig Start Date End Date Taking? Authorizing Provider  albuterol (PROAIR HFA) 108 (90 Base) MCG/ACT inhaler Inhale 2 puffs into the lungs every 6 (six) hours as needed for wheezing or shortness of breath. 08/12/15  Yes Charm Rings, MD  albuterol (PROVENTIL) (2.5 MG/3ML) 0.083% nebulizer solution Take 3 mLs (2.5 mg total) by nebulization every 6 (six) hours as needed for wheezing or shortness of breath. 08/12/15  Yes Charm Rings, MD  amLODipine (NORVASC) 10 MG tablet Take 1 tablet (10 mg total) by mouth daily. 02/27/15  Yes Rodolph Bong, MD  carvedilol (COREG) 25 MG tablet Take 1 tablet (25 mg total) by mouth 2 (two) times daily with a meal. 02/10/15  Yes Costin Otelia Sergeant, MD  ferrous sulfate 325 (65 FE) MG tablet Take 1  tablet (325 mg total) by mouth 3 (three) times daily with meals. 02/10/15  Yes Costin Otelia Sergeant, MD  insulin glargine (LANTUS) 100 UNIT/ML injection Inject 0.3 mLs (30 Units total) into the skin daily. Patient taking differently: Inject 15 Units into the skin daily.  02/27/15  Yes Rodolph Bong, MD  isosorbide mononitrate (IMDUR) 30 MG 24 hr tablet Take 1 tablet (30 mg total) by mouth daily. 02/27/15  Yes Rodolph Bong, MD  potassium  chloride SA (K-DUR,KLOR-CON) 20 MEQ tablet Take 2 tablets (40 mEq total) by mouth daily. 02/27/15  Yes Rodolph Bong, MD  acetaminophen (TYLENOL) 500 MG tablet Take 1,000 mg by mouth every 6 (six) hours as needed for fever.    Historical Provider, MD  Acetaminophen-Codeine (TYLENOL WITH CODEINE #3 PO) Take by mouth.    Historical Provider, MD  doxycycline (VIBRAMYCIN) 100 MG capsule Take 1 capsule (100 mg total) by mouth 2 (two) times daily. Patient not taking: Reported on 12/07/2015 08/12/15   Charm Rings, MD  furosemide (LASIX) 80 MG tablet Take 2 tablets (160 mg total) by mouth 2 (two) times daily. 02/27/15   Rodolph Bong, MD  insulin aspart (NOVOLOG) 100 UNIT/ML injection Inject 0-20 Units into the skin 3 (three) times daily with meals. CBG < 70: implement hypoglycemia protocol CBG 70 - 120: 0 units CBG 121 - 150: 3 units CBG 151 - 200: 4 units CBG 201 - 250: 7 units CBG 251 - 300: 11 units CBG 301 - 350: 15 units CBG 351 - 400: 20 units 09/09/13   Maryann Mikhail, DO  predniSONE (DELTASONE) 50 MG tablet Take 1 pill daily for 5 days. Start on 08/13/15. 08/12/15   Charm Rings, MD   Physical Exam: Vitals:   12/07/15 1113 12/07/15 1130 12/07/15 1200 12/07/15 1230  BP:  152/89 154/88 153/85  Pulse:  100 100 97  Resp:  26 (!) 28 25  Temp: 99.8 F (37.7 C)     TempSrc: Axillary     SpO2:  100% 100% 100%  Weight:      Height:        Wt Readings from Last 3 Encounters:  12/07/15 (!) 158.8 kg (350 lb)  04/29/15 (!) 139 kg (306 lb 6 oz)  02/26/15 (!) 186.4 kg (410 lb 15 oz)    General:  Appears calm and comfortable; Super morbid obesity Eyes:  PERRL, EOMI, normal lids, iris; amblyopia on right ENT:  grossly normal hearing, lips & tongue; very dry Neck:  no LAD, masses or thyromegaly Cardiovascular:  RRR, no m/r/g. Venous stasis. Anasarca and lower abdomen and lower extremities and arms Respiratory:  Mild wheeze, normal work of breathing Abdomen:  soft, NDNT Skin:  no rash or  induration seen on limited exam Musculoskeletal:  grossly normal tone BUE/BLE Psychiatric:  grossly normal mood and affect, speech fluent and appropriate Neurologic:  CN 2-12 grossly intact, moves all extremities in coordinated fashion. alert and oriented 3           Labs on Admission:  Basic Metabolic Panel:  Recent Labs Lab 12/07/15 1119  NA 137  K 4.6  CL 110  CO2 17*  GLUCOSE 130*  BUN 49*  CREATININE 5.49*  CALCIUM 8.6*   Liver Function Tests: No results for input(s): AST, ALT, ALKPHOS, BILITOT, PROT, ALBUMIN in the last 168 hours. No results for input(s): LIPASE, AMYLASE in the last 168 hours. No results for input(s): AMMONIA in the last 168 hours. CBC:  Recent  Labs Lab 12/07/15 1119  WBC 15.3*  HGB 8.0*  HCT 25.1*  MCV 86.3  PLT 288   Cardiac Enzymes: No results for input(s): CKTOTAL, CKMB, CKMBINDEX, TROPONINI in the last 168 hours.  BNP (last 3 results)  Recent Labs  02/04/15 0031 02/19/15 1123 12/07/15 1119  BNP 349.2* 921.9* 375.4*    ProBNP (last 3 results) No results for input(s): PROBNP in the last 8760 hours.   Serum creatinine: 5.49 mg/dL High 16/11/9608/26/17 [id number] Estimated creatinine clearance: 21.2 mL/min  CBG: No results for input(s): GLUCAP in the last 168 hours.  Radiological Exams on Admission: Dg Chest 2 View  Result Date: 12/07/2015 CLINICAL DATA:  Persistent shortness of breath for 3 days. Hypertension, CHF, diabetes EXAM: CHEST  2 VIEW COMPARISON:  02/20/2015 FINDINGS: Limited exam because of obese body habitus. Cardiomegaly evident with vascular congestion centrally. Low lung volumes noted. No definite focal pneumonia, collapse or consolidation. No current edema, large effusion or pneumothorax. Trachea is midline. Lateral view is limited because of underexposure. IMPRESSION: Cardiomegaly with vascular congestion and low lung volumes. No current significant CHF or pneumonia. Electronically Signed   By: Judie PetitM.  Shick M.D.   On: 12/07/2015  10:45    EKG: Independently reviewed. VR 100, PR 160, QRs 88, Qtc 441; NO STEMI, Compared to December 2016 EKG no acute ST changes  Assessment/Plan Principal Problem:   Dyspnea Active Problems:   Thigh abscess   AKI (acute kidney injury) (HCC)   Anasarca   Asthma   Essential hypertension   Dyspnea/Anasarca Worse per patient over last few days, diuresis ECHO tomorrow Differential diagnosis asthma versus CHF Sch Duoneb Prn albuterol  Fever, reported Flu swab to be done, droplet precautions, no confirmed fever in the ER but patient was given Tylenol Nursing home difficulty collecting urine specimen Patient dose Rocephin x1 (white count, fever, dyspnea), can be stopped tomorrow if viral  Thigh Abscess Wound Care consult  AKI Baseline Cr 2.73, Cr on admit 5.49 0 of normal saline given in the emergency room Gentle hydration overnight Checking magnesium and phosphorus Urine labs ordered to calculate fractional excretion of urea Early Nephro consult - will defer img to them I&Os to be monitored  Hypertension When necessary hydralazine 10 mg IV as needed for severe blood pressure  DM  SSI Holding glargine 15u  Code Status: Full DVT Prophylaxis: heparin Family Communication: Husband updated at bedside Disposition Plan: Pending Improvement    Haydee SalterPhillip M Ayala Ribble, MD Family Medicine [facility] [email or web address] Password [email or web address]

## 2015-12-07 NOTE — ED Notes (Signed)
Myself and Clydie BraunKaren, RN assisted Linda LandAngela, RN with taking patient off of bedpan; cleaned patient and readjusted patient on stretcher; visitor stepped out

## 2015-12-07 NOTE — ED Notes (Signed)
On way to Xray 

## 2015-12-07 NOTE — ED Provider Notes (Signed)
[facility]-EMERGENCY DEPT Provider Note   CSN: [id number] Arrival date & time: 12/07/15  [id number]     History   Chief Complaint Chief Complaint  Patient presents with  . Shortness of Breath    HPI [name] is a 40 y.o. female.  HPI   40 year old morbidly obese female with history of CHF, hypertension, diabetes, asthma brought here via EMS from home for evaluation of shortness of breath. Patient report for the past 3 or 4 days she has had progressive worsening shortness of breath. Shortness of breath is with laying and with sitting. She denies cough but endorse generalized fatigue, as well as increase swelling to her hands and feet. She felt that the swelling is related to her CHF that is not well controlled despite taking her Lasix as recommended. She is having trouble getting up and moving about due to generalized weakness.  She also report having a fever of 103 several days prior in which she takes Tylenol with some relief. She denies any active chest pain, hemoptysis, abdominal pain, focal numbness weakness or leg pain. She does have a wound to her right leg has been managed by the wound care center with regular dressing change. She denies any complication with her wound. Patient states she usually use a wheelchair to move around but for the past few days she has been mostly resting in her bed.  Past Medical History:  Diagnosis Date  . Asthma   . CHF (congestive heart failure) (HCC)    diastolic  . CVA (cerebral infarction) 1990's   "writing is not the same since" R leg weakness   . GERD (gastroesophageal reflux disease)   . Hypertension   . Morbid obesity (HCC)   . Stroke (HCC)   . Type II diabetes mellitus [facility](HCC)     Patient Active Problem List   Diagnosis Date Noted  . Type 2 diabetes, HbA1c goal < 7% (HCC)   . Acute respiratory failure (HCC)   . E-coli UTI 02/23/2015  . Leukocytosis   . Cellulitis 02/19/2015  . History of stroke 02/19/2015  . Asthma  exacerbation 02/19/2015  . Acute diastolic (congestive) heart failure 02/19/2015  . Cellulitis of left lower extremity   . Essential hypertension   . Cellulitis of right lower extremity   . Chronic kidney disease (CKD), stage IV (severe) (HCC) 02/10/2015  . Aortic valve mass   . Elevated brain natriuretic peptide (BNP) level 02/05/2015  . Anasarca 02/05/2015  . Acute CHF (congestive heart failure) (HCC) 02/05/2015  . Asthma 02/05/2015  . Hypoalbuminemia 02/05/2015  . Anemia due to other cause 02/05/2015  . Morbid obesity (HCC) 02/05/2015  . Bleeding from wound 09/16/2013  . Hypertriglyceridemia 09/16/2013  . Diarrhea 09/16/2013  . Cerebral embolism with cerebral infarction (HCC) 09/04/2013  . Thigh abscess 08/30/2013  . Sepsis (HCC) 08/30/2013  . Diabetes mellitus with hyperglycemia (HCC) 08/30/2013  . AKI (acute kidney injury) (HCC) 08/30/2013  . Acute respiratory failure with hypoxia (HCC) 08/30/2013    Past Surgical History:  Procedure Laterality Date  . INCISION AND DRAINAGE OF WOUND Left 08/30/2013   thigh necrotic wound/notes 08/30/2013  . IRRIGATION AND DEBRIDEMENT ABSCESS Left 08/30/2013   Procedure: IRRIGATION AND DEBRIDEMENT ABSCESS Left Thigh Necrotic Wound;  Surgeon: Axel FillerArmando Ramirez, MD;  Location: [facility] OR;  Service: General;  Laterality: Left;  . TEE WITHOUT CARDIOVERSION N/A 09/07/2013   Procedure: TRANSESOPHAGEAL ECHOCARDIOGRAM (TEE);  Surgeon: Chrystie NoseKenneth C. Hilty, MD;  [facility];  Service: Cardiovascular;  Laterality: N/A;  .  TEE WITHOUT CARDIOVERSION N/A 02/08/2015   Procedure: TRANSESOPHAGEAL ECHOCARDIOGRAM (TEE);  Surgeon: Chilton Si, MD;  [facility]COPY;  Service: Cardiovascular;  Laterality: N/A;    OB History    Gravida Para Term Preterm AB Living   3 3 3  0 0 3   SAB TAB Ectopic Multiple Live Births   0 0 0 0 3       Home Medications    Prior to Admission medications   Medication Sig Start Date End Date Taking? Authorizing  Provider  acetaminophen (TYLENOL) 500 MG tablet Take 1,000 mg by mouth every 6 (six) hours as needed for fever.    Historical Provider, MD  Acetaminophen-Codeine (TYLENOL WITH CODEINE #3 PO) Take by mouth.    Historical Provider, MD  albuterol (PROAIR HFA) 108 (90 Base) MCG/ACT inhaler Inhale 2 puffs into the lungs every 6 (six) hours as needed for wheezing or shortness of breath. 08/12/15   Charm Rings, MD  albuterol (PROVENTIL) (2.5 MG/3ML) 0.083% nebulizer solution Take 3 mLs (2.5 mg total) by nebulization every 6 (six) hours as needed for wheezing or shortness of breath. 08/12/15   Charm Rings, MD  amLODipine (NORVASC) 10 MG tablet Take 1 tablet (10 mg total) by mouth daily. 02/27/15   Rodolph Bong, MD  carvedilol (COREG) 25 MG tablet Take 1 tablet (25 mg total) by mouth 2 (two) times daily with a meal. 02/10/15   Costin Otelia Sergeant, MD  doxycycline (VIBRAMYCIN) 100 MG capsule Take 1 capsule (100 mg total) by mouth 2 (two) times daily. 08/12/15   Charm Rings, MD  ferrous sulfate 325 (65 FE) MG tablet Take 1 tablet (325 mg total) by mouth 3 (three) times daily with meals. 02/10/15   Costin Otelia Sergeant, MD  furosemide (LASIX) 80 MG tablet Take 2 tablets (160 mg total) by mouth 2 (two) times daily. 02/27/15   Rodolph Bong, MD  insulin aspart (NOVOLOG) 100 UNIT/ML injection Inject 0-20 Units into the skin 3 (three) times daily with meals. CBG < 70: implement hypoglycemia protocol CBG 70 - 120: 0 units CBG 121 - 150: 3 units CBG 151 - 200: 4 units CBG 201 - 250: 7 units CBG 251 - 300: 11 units CBG 301 - 350: 15 units CBG 351 - 400: 20 units 09/09/13   Maryann Mikhail, DO  insulin glargine (LANTUS) 100 UNIT/ML injection Inject 0.3 mLs (30 Units total) into the skin daily. Patient taking differently: Inject 15 Units into the skin daily.  02/27/15   Rodolph Bong, MD  isosorbide mononitrate (IMDUR) 30 MG 24 hr tablet Take 1 tablet (30 mg total) by mouth daily. 02/27/15   Rodolph Bong, MD    potassium chloride SA (K-DUR,KLOR-CON) 20 MEQ tablet Take 2 tablets (40 mEq total) by mouth daily. 02/27/15   Rodolph Bong, MD  predniSONE (DELTASONE) 50 MG tablet Take 1 pill daily for 5 days. Start on 08/13/15. 08/12/15   Charm Rings, MD    Family History Family History  Problem Relation Age of Onset  . Diabetes Mother   . Kidney disease Mother     Social History Social History  Substance Use Topics  . Smoking status: Never Smoker  . Smokeless tobacco: Never Used  . Alcohol use No     Allergies   Azithromycin   Review of Systems Review of Systems  All other systems reviewed and are negative.    Physical Exam Updated Vital Signs BP (!) 157/102 (BP Location:  Left Arm)   Pulse 86   Temp 98.9 F (37.2 C) (Oral)   Resp 22   Ht 5\' 5"  (1.651 m)   Wt (!) 158.8 kg   SpO2 93%   BMI 58.24 kg/m   Physical Exam  Constitutional: She appears well-developed and well-nourished. No distress.  Morbidly obese female laying in bed in mild respiratory discomfort.  HENT:  Head: Atraumatic.  Mouth/Throat: Oropharynx is clear and moist.  Eyes: Conjunctivae are normal.  Neck: Neck supple. No JVD present.  Cardiovascular: Normal rate, regular rhythm and intact distal pulses.   Pulmonary/Chest:  Patient is mildly tachypneic. Decreased breath sounds secondary to large body habitus. Crackles heard at the lung base with faint expiratory wheezes.  Musculoskeletal: She exhibits edema (2+ pedal edema to bilateral hands on the dorsum. Pedal edema to bilateral lower extremities with wound wrapping throughout right lower leg).  Neurological: She is alert.  Skin: No rash noted.  Psychiatric: She has a normal mood and affect.  Nursing note and vitals reviewed.    ED Treatments / Results  Labs (all labs ordered are listed, but only abnormal results are displayed) Labs Reviewed  BASIC METABOLIC PANEL - Abnormal; Notable for the following:       Result Value   CO2 17 (*)    Glucose,  Bld 130 (*)    BUN 49 (*)    Creatinine, Ser 5.49 (*)    Calcium 8.6 (*)    GFR calc non Af Amer 9 (*)    GFR calc Af Amer 10 (*)    All other components within normal limits  CBC - Abnormal; Notable for the following:    WBC 15.3 (*)    RBC 2.91 (*)    Hemoglobin 8.0 (*)    HCT 25.1 (*)    All other components within normal limits  BRAIN NATRIURETIC PEPTIDE - Abnormal; Notable for the following:    B Natriuretic Peptide 375.4 (*)    All other components within normal limits  INFLUENZA PANEL BY PCR (TYPE A & B, H1N1)  URINALYSIS, ROUTINE W REFLEX MICROSCOPIC (NOT AT [facility])  TROPONIN I  TROPONIN I  TROPONIN I  LACTIC ACID, PLASMA  LACTIC ACID, PLASMA  BLOOD GAS, VENOUS  I-STAT TROPOININ, ED  I-STAT CG4 LACTIC ACID, ED    EKG  EKG Interpretation  Date/Time:  Thursday December 07 2015 10:13:49 EDT Ventricular Rate:  100 PR Interval:  160 QRS Duration: 88 QT Interval:  342 QTC Calculation: 441 R Axis:   72 Text Interpretation:  Normal sinus rhythm Demand pacemaker; interpretation is based on intrinsic rhythm Sinus rhythm with occasional Premature ventricular complexes and Fusion complexes Nonspecific T wave abnormality Abnormal ECG No significant change since last tracing Confirmed by [facility] MD, ERIN ([id number]) on 12/07/2015 1:49:30 PM       Radiology Dg Chest 2 View  Result Date: 12/07/2015 CLINICAL DATA:  Persistent shortness of breath for 3 days. Hypertension, CHF, diabetes EXAM: CHEST  2 VIEW COMPARISON:  02/20/2015 FINDINGS: Limited exam because of obese body habitus. Cardiomegaly evident with vascular congestion centrally. Low lung volumes noted. No definite focal pneumonia, collapse or consolidation. No current edema, large effusion or pneumothorax. Trachea is midline. Lateral view is limited because of underexposure. IMPRESSION: Cardiomegaly with vascular congestion and low lung volumes. No current significant CHF or pneumonia. Electronically Signed   By: Judie Petit.  Shick  M.D.   On: 12/07/2015 10:45    Procedures Procedures (including critical care time)  Medications Ordered in  ED Medications  furosemide (LASIX) injection 80 mg (not administered)  acetaminophen (TYLENOL) tablet 650 mg (650 mg Oral Given 12/07/15 1150)  albuterol (PROVENTIL) (2.5 MG/3ML) 0.083% nebulizer solution 5 mg (5 mg Nebulization Given 12/07/15 1423)  ipratropium (ATROVENT) nebulizer solution 0.5 mg (0.5 mg Nebulization Given 12/07/15 1424)     Initial Impression / Assessment and Plan / ED Course  I have reviewed the triage vital signs and the nursing notes.  Pertinent labs & imaging results that were available during my care of the patient were reviewed by me and considered in my medical decision making (see chart for details).  Clinical Course  Value Comment By Time  Hemoglobin: (!) 8.0 (Reviewed) Fayrene Helper, PA-C 10/26 1331    BP 153/85   Pulse 97   Temp 99.8 F (37.7 C) (Axillary)   Resp 25   Ht 5\' 5"  (1.651 m)   Wt (!) 158.8 kg   SpO2 100%   BMI 58.24 kg/m    Final Clinical Impressions(s) / ED Diagnoses   Final diagnoses:  Acute on chronic systolic congestive heart failure (HCC)  AKI (acute kidney injury) (HCC)  Symptomatic anemia    New Prescriptions New Prescriptions   No medications on file   11:10 AM Patient here with progressive worsened shortness of breath, and peripheral edema with history of CHF. Suspect CHF exacerbation causing his shortness of breath. She does report having fever home but denies cough. On exam she does have some crackles in her lung base but chest x-ray without signs of pneumonia. She has a decubitus ulcer to her right lower extremities that has been managed by the wound care center. I did remove her dressing to examine the wound and it appears to be well granulated decubitus ulcer to the right posterior calf near the popliteal fossa. Surrounding skin erythema likely chronic due to chronic venous stasis. She has an axillary temp  of 99.8.  Tylenol given.  Work up initiated. Doubt PE.  1:30 PM Patient does have an elevated BNP of 375. Chest x-ray showing vascular congestion without overt fluid overload or pneumonia. Elevated white count of 15.3. Hemoglobin is 8. Evidence of renal insufficiency with a creatinine of 5.49 which is elevated from her baseline.  2:34 PM Appreciate consultation from [facility] Dr. Melynda Ripple who agrees to see pt in the ER and will admit to telemetry floor, observation status for further management of her condition.  Suspect CHF exacerbation vs. Flu sxs.  UA have not resulted at this time.  Pt had loose stool while in the ER.  No recent abx use.      Fayrene Helper, PA-C 12/07/15 1436    Alvira Monday, MD 12/07/15 2342

## 2015-12-07 NOTE — Progress Notes (Signed)
Lab called critical troponin 0.04.  MD paged.  

## 2015-12-07 NOTE — ED Notes (Signed)
Introduce myself to patient got patient vitals

## 2015-12-08 ENCOUNTER — Encounter (HOSPITAL_COMMUNITY): Payer: Self-pay | Admitting: Rehabilitation

## 2015-12-08 DIAGNOSIS — J45909 Unspecified asthma, uncomplicated: Secondary | ICD-10-CM | POA: Diagnosis present

## 2015-12-08 DIAGNOSIS — N179 Acute kidney failure, unspecified: Secondary | ICD-10-CM | POA: Diagnosis present

## 2015-12-08 DIAGNOSIS — I5043 Acute on chronic combined systolic (congestive) and diastolic (congestive) heart failure: Secondary | ICD-10-CM | POA: Diagnosis present

## 2015-12-08 DIAGNOSIS — E872 Acidosis: Secondary | ICD-10-CM | POA: Diagnosis present

## 2015-12-08 DIAGNOSIS — N184 Chronic kidney disease, stage 4 (severe): Secondary | ICD-10-CM | POA: Diagnosis present

## 2015-12-08 DIAGNOSIS — L97219 Non-pressure chronic ulcer of right calf with unspecified severity: Secondary | ICD-10-CM | POA: Diagnosis present

## 2015-12-08 DIAGNOSIS — E1121 Type 2 diabetes mellitus with diabetic nephropathy: Secondary | ICD-10-CM | POA: Diagnosis present

## 2015-12-08 DIAGNOSIS — L02416 Cutaneous abscess of left lower limb: Secondary | ICD-10-CM | POA: Diagnosis present

## 2015-12-08 DIAGNOSIS — E11622 Type 2 diabetes mellitus with other skin ulcer: Secondary | ICD-10-CM | POA: Diagnosis present

## 2015-12-08 DIAGNOSIS — R0609 Other forms of dyspnea: Secondary | ICD-10-CM

## 2015-12-08 DIAGNOSIS — Z7401 Bed confinement status: Secondary | ICD-10-CM | POA: Diagnosis not present

## 2015-12-08 DIAGNOSIS — Z794 Long term (current) use of insulin: Secondary | ICD-10-CM | POA: Diagnosis not present

## 2015-12-08 DIAGNOSIS — K219 Gastro-esophageal reflux disease without esophagitis: Secondary | ICD-10-CM | POA: Diagnosis present

## 2015-12-08 DIAGNOSIS — Z841 Family history of disorders of kidney and ureter: Secondary | ICD-10-CM | POA: Diagnosis not present

## 2015-12-08 DIAGNOSIS — R0602 Shortness of breath: Secondary | ICD-10-CM | POA: Diagnosis present

## 2015-12-08 DIAGNOSIS — I69351 Hemiplegia and hemiparesis following cerebral infarction affecting right dominant side: Secondary | ICD-10-CM | POA: Diagnosis not present

## 2015-12-08 DIAGNOSIS — Z6841 Body Mass Index (BMI) 40.0 and over, adult: Secondary | ICD-10-CM | POA: Diagnosis not present

## 2015-12-08 DIAGNOSIS — F40240 Claustrophobia: Secondary | ICD-10-CM | POA: Diagnosis present

## 2015-12-08 DIAGNOSIS — Z833 Family history of diabetes mellitus: Secondary | ICD-10-CM | POA: Diagnosis not present

## 2015-12-08 DIAGNOSIS — E662 Morbid (severe) obesity with alveolar hypoventilation: Secondary | ICD-10-CM | POA: Diagnosis present

## 2015-12-08 DIAGNOSIS — I130 Hypertensive heart and chronic kidney disease with heart failure and stage 1 through stage 4 chronic kidney disease, or unspecified chronic kidney disease: Secondary | ICD-10-CM | POA: Diagnosis present

## 2015-12-08 DIAGNOSIS — D638 Anemia in other chronic diseases classified elsewhere: Secondary | ICD-10-CM | POA: Diagnosis present

## 2015-12-08 DIAGNOSIS — E1122 Type 2 diabetes mellitus with diabetic chronic kidney disease: Secondary | ICD-10-CM | POA: Diagnosis present

## 2015-12-08 DIAGNOSIS — E1165 Type 2 diabetes mellitus with hyperglycemia: Secondary | ICD-10-CM | POA: Diagnosis present

## 2015-12-08 DIAGNOSIS — D72820 Lymphocytosis (symptomatic): Secondary | ICD-10-CM | POA: Diagnosis present

## 2015-12-08 LAB — GLUCOSE, CAPILLARY
GLUCOSE-CAPILLARY: 133 mg/dL — AB (ref 65–99)
GLUCOSE-CAPILLARY: 140 mg/dL — AB (ref 65–99)
GLUCOSE-CAPILLARY: 125 mg/dL — AB (ref 65–99)
Glucose-Capillary: 126 mg/dL — ABNORMAL HIGH (ref 65–99)

## 2015-12-08 LAB — CBC
HEMATOCRIT: 24.5 % — AB (ref 36.0–46.0)
HEMOGLOBIN: 7.9 g/dL — AB (ref 12.0–15.0)
MCH: 27.3 pg (ref 26.0–34.0)
MCHC: 32.2 g/dL (ref 30.0–36.0)
MCV: 84.8 fL (ref 78.0–100.0)
Platelets: 314 10*3/uL (ref 150–400)
RBC: 2.89 MIL/uL — ABNORMAL LOW (ref 3.87–5.11)
RDW: 15.2 % (ref 11.5–15.5)
WBC: 14.4 10*3/uL — AB (ref 4.0–10.5)

## 2015-12-08 LAB — RENAL FUNCTION PANEL
ANION GAP: 8 (ref 5–15)
Albumin: 2.2 g/dL — ABNORMAL LOW (ref 3.5–5.0)
BUN: 52 mg/dL — AB (ref 6–20)
CHLORIDE: 112 mmol/L — AB (ref 101–111)
CO2: 17 mmol/L — ABNORMAL LOW (ref 22–32)
Calcium: 8.7 mg/dL — ABNORMAL LOW (ref 8.9–10.3)
Creatinine, Ser: 5.36 mg/dL — ABNORMAL HIGH (ref 0.44–1.00)
GFR, EST AFRICAN AMERICAN: 11 mL/min — AB (ref >60)
GFR, EST NON AFRICAN AMERICAN: 9 mL/min — AB (ref >60)
Glucose, Bld: 134 mg/dL — ABNORMAL HIGH (ref 65–99)
PHOSPHORUS: 4.9 mg/dL — AB (ref 2.5–4.6)
POTASSIUM: 4.5 mmol/L (ref 3.5–5.1)
Sodium: 137 mmol/L (ref 135–145)

## 2015-12-08 LAB — TROPONIN I: TROPONIN I: 0.09 ng/mL — AB (ref <0.03)

## 2015-12-08 MED ORDER — PANTOPRAZOLE SODIUM 40 MG PO TBEC
40.0000 mg | DELAYED_RELEASE_TABLET | Freq: Every day | ORAL | Status: DC
  Administered 2015-12-08 – 2015-12-15 (×8): 40 mg via ORAL
  Filled 2015-12-08 (×8): qty 1

## 2015-12-08 MED ORDER — AMLODIPINE BESYLATE 10 MG PO TABS
10.0000 mg | ORAL_TABLET | Freq: Every day | ORAL | Status: DC
  Administered 2015-12-08 – 2015-12-15 (×8): 10 mg via ORAL
  Filled 2015-12-08 (×8): qty 1

## 2015-12-08 MED ORDER — CARVEDILOL 25 MG PO TABS
25.0000 mg | ORAL_TABLET | Freq: Two times a day (BID) | ORAL | Status: DC
  Administered 2015-12-08 – 2015-12-12 (×9): 25 mg via ORAL
  Filled 2015-12-08 (×9): qty 1

## 2015-12-08 MED ORDER — ISOSORBIDE MONONITRATE ER 30 MG PO TB24
30.0000 mg | ORAL_TABLET | Freq: Every day | ORAL | Status: DC
  Administered 2015-12-08 – 2015-12-15 (×8): 30 mg via ORAL
  Filled 2015-12-08 (×8): qty 1

## 2015-12-08 NOTE — Progress Notes (Signed)
[facility]  Patient Status: Active (receiving services up to time of hospitalization)  [facility] is providing the following services: RN  If patient discharges after hours, please call [phone].   [name] 12/08/2015, 2:13 PM

## 2015-12-08 NOTE — Progress Notes (Signed)
DrArrien informed of pt's IVF stopped last nite by nite shift MD. Due to bil. Hands swollen.  MD ordered to stopped ivf. [name], Charity fundraiserN.

## 2015-12-08 NOTE — Progress Notes (Signed)
Nutrition Brief Note  Patient identified on the Malnutrition Screening Tool (MST) Report  Wt Readings from Last 15 Encounters:  12/08/15 (!) 484 lb 14.4 oz (219.9 kg)  04/29/15 (!) 306 lb 6 oz (139 kg)  02/26/15 (!) 410 lb 15 oz (186.4 kg)  02/10/15 (!) 368 lb 6.4 oz (167.1 kg)  10/08/13 272 lb (123.4 kg)  09/16/13 300 lb (136.1 kg)  09/02/13 (!) 319 lb 3.6 oz (144.8 kg)  02/21/13 296 lb 3.2 oz (134.4 kg)  02/20/13 297 lb (134.7 kg)  10/11/12 177 lb 12.8 oz (80.6 kg)  10/06/12 178 lb 8 oz (81 kg)  09/29/12 176 lb 12.8 oz (80.2 kg)  09/22/12 175 lb 1.6 oz (79.4 kg)  09/15/12 174 lb 4.8 oz (79.1 kg)  09/07/12 172 lb (78 kg)    Body mass index is 80.69 kg/m. Patient meets criteria for Morbid Obesity based on current BMI.   Current diet order is Renal/Carb Modified, patient is consuming approximately 75% of meals at this time. Pt reports eating poorly for one week PTA and she relates poor PO intake to reflux. She denies having a poor appetite, abdominal pain or nausea. She feels she is eating better today- had sandwich, fruit, and soda for lunch. She reports following a low salt diet PTA; she denies any nutrition education needs at this time. RD encouraged healthful PO intake with protein, fruits, and vegetables at each meal. Nutrition-focused physical exam done. Labs and medications reviewed.   Will provide daily multivitamin with minerals daily. No additional nutrition interventions warranted at this time. If nutrition issues arise, please consult RD.   Dorothea Ogleeanne Laurie Penado RD, CSP, LDN Inpatient Clinical Dietitian Pager: [phone] After Hours Pager: [phone]

## 2015-12-08 NOTE — Progress Notes (Signed)
This RN and NT have been in the patient room for several times, changing the patient, repositioning the patient, dressing change to RLE, putting warm blankets., etc. Patient  complaining that we didn't go to her room after she calls the nursing station to clean her up.  This Clinical research associatewriter and the NT was just in the patient room few minutes ago to reposition the patient and to check O2 level because  patient c/o of SOB. O2 97%.Put [place] at 2L and called Respiratory therapist for  breathing treatment .Patient spouse called stated that we never take care of her. Charge nurse notified.

## 2015-12-08 NOTE — Progress Notes (Signed)
PROGRESS NOTE    Linda DykesDavenia [name] [name]  ZOX:[id number] DOB: 09/17/1975 DOA: 12/07/2015 PCP: Dorrene GermanEdwin A Avbuere, MD   Brief Narrative:  40 yo female with ckd stage IV, presents with generalized weakness, diarrhea and decreased appetite, over 7 days.  On exam noted severe edema on the lower extremities. Worsening renal function to cr up to 5. Started on diuresis.   Assessment & Plan:   Principal Problem:   Dyspnea Active Problems:   Thigh abscess   AKI (acute kidney injury) (HCC)   Anasarca   Asthma   Essential hypertension  1. AKI on CKD stage 4. Patient with hypervolemia, started on high dose of loop diuretics, urine output 0 since admission, will place foley catheter to have accurate measurements of urine output.  K is 4,5, serum bicarb 17, cw non gap metabolic acidosis. Phos 4.9. Will follow on renal function, nephrology consulted, patient may need HD on this admission. Suspected hypertensive or diabetic nephropathy. Urine protein greater than 300.   2. HTN. Uncontrolled hypertension, systolic 180 to 190, will continue with as needed hydralazine, will resume isosorbide, coreg and amlodipine.    3. Asthma. Will continue bronchodilator therapy as needed with duonebs.  4. T2DM. Will continue glucose cover and monitoring. Capillary glucose 128-133-140.    5. Morbid obesity. dvt px.   6. Right leg, proximal, ulcerated lesion stage 2. No antibiotics indicated, will consult wound care.     DVT prophylaxis: heparin.  Code Status: full  Family Communication: no family at the bedside Disposition Plan: home   Consultants:   nephrology   Procedures:   Antimicrobials:   Subjective: Patient feeling very weak and deconditioned, no significant dyspnea, no chest pain, no nausea or vomiting. Last night noted volume overloaded and IV fluids were held, now on IV furosemide.   Objective: Vitals:   12/08/15 0706 12/08/15 0719 12/08/15 0807 12/08/15 0838  BP: (!) 186/92  (!) 179/84     Pulse: (!) 101  (!) 103   Resp:      Temp:      TempSrc:      SpO2: 98% 97%  95%  Weight:      Height:        Intake/Output Summary (Last 24 hours) at 12/08/15 1001 Last data filed at 12/08/15 0957  Gross per 24 hour  Intake           1937.5 ml  Output                0 ml  Net           1937.5 ml   Filed Weights   12/07/15 0955 12/08/15 0454  Weight: (!) 158.8 kg (350 lb) (!) 219.9 kg (484 lb 14.4 oz)    Examination:  General exam: deconditioned and ill looking apperaring E ENT. Mild conjunctival pallor, oral mucosa moist. Respiratory system: Clear to auscultation. Respiratory effort normal. Mild decreased breath sounds at the dependent zones.  Cardiovascular system: S1 & S2 heard, RRR. No JVD, murmurs, rubs, gallops or clicks. ++++ pitting edema. Gastrointestinal system: Abdomen is nondistended, soft and nontender. No organomegaly or masses felt. Normal bowel sounds heard. Central nervous system: Alert and oriented. No focal neurological deficits. Extremities: Symmetric 5 x 5 power. Skin: No rashes, lesions or ulcers      Data Reviewed: I have personally reviewed following labs and imaging studies  CBC:  Recent Labs Lab 12/07/15 1119 12/07/15 2132 12/08/15 0250  WBC 15.3* 14.2* 14.4*  HGB 8.0* 7.7* 7.9*  HCT 25.1* 24.0* 24.5*  MCV 86.3 84.2 84.8  PLT 288 317 314   Basic Metabolic Panel:  Recent Labs Lab 12/07/15 1119 12/07/15 1615 12/07/15 2132 12/08/15 0250  NA 137  --   --  137  K 4.6  --   --  4.5  CL 110  --   --  112*  CO2 17*  --   --  17*  GLUCOSE 130*  --   --  134*  BUN 49*  --   --  52*  CREATININE 5.49*  --  5.56* 5.36*  CALCIUM 8.6*  --   --  8.7*  MG  --  2.0  --   --   PHOS  --  4.6  --  4.9*   GFR: Estimated Creatinine Clearance: 27.2 mL/min (by C-G formula based on SCr of 5.36 mg/dL (H)). Liver Function Tests:  Recent Labs Lab 12/08/15 0250  ALBUMIN 2.2*   No results for input(s): LIPASE, AMYLASE in the last 168  hours. No results for input(s): AMMONIA in the last 168 hours. Coagulation Profile: No results for input(s): INR, PROTIME in the last 168 hours. Cardiac Enzymes:  Recent Labs Lab 12/07/15 1615 12/07/15 2132 12/08/15 0250  TROPONINI 0.04* 0.19* 0.09*   BNP (last 3 results) No results for input(s): PROBNP in the last 8760 hours. HbA1C: No results for input(s): HGBA1C in the last 72 hours. CBG:  Recent Labs Lab 12/07/15 1703 12/07/15 2049 12/08/15 0624  GLUCAP 128* 126* 133*   Lipid Profile: No results for input(s): CHOL, HDL, LDLCALC, TRIG, CHOLHDL, LDLDIRECT in the last 72 hours. Thyroid Function Tests: No results for input(s): TSH, T4TOTAL, FREET4, T3FREE, THYROIDAB in the last 72 hours. Anemia Panel:  Recent Labs  12/07/15 2132  FERRITIN 711*  TIBC 129*  IRON 16*   Sepsis Labs:  Recent Labs Lab 12/07/15 1126 12/07/15 1620 12/07/15 2132  LATICACIDVEN 0.73 1.0 0.9    No results found for this or any previous visit (from the past 240 hour(s)).       Radiology Studies: Dg Chest 2 View  Result Date: 12/07/2015 CLINICAL DATA:  Persistent shortness of breath for 3 days. Hypertension, CHF, diabetes EXAM: CHEST  2 VIEW COMPARISON:  02/20/2015 FINDINGS: Limited exam because of obese body habitus. Cardiomegaly evident with vascular congestion centrally. Low lung volumes noted. No definite focal pneumonia, collapse or consolidation. No current edema, large effusion or pneumothorax. Trachea is midline. Lateral view is limited because of underexposure. IMPRESSION: Cardiomegaly with vascular congestion and low lung volumes. No current significant CHF or pneumonia. Electronically Signed   By: Judie Petit.  Shick M.D.   On: 12/07/2015 10:45        Scheduled Meds: . cefTRIAXone (ROCEPHIN)  IV  1 g Intravenous Q24H  . furosemide  160 mg Intravenous BID  . furosemide  80 mg Intravenous Once  . heparin  5,000 Units Subcutaneous Q8H  . insulin aspart  0-20 Units Subcutaneous  TID WC  . ipratropium-albuterol  3 mL Nebulization TID   Continuous Infusions:    LOS: 0 days       Tajia Szeliga Annett Gula, MD [facility] Pager [phone]  If 7PM-7AM, please contact night-coverage [email or web address] Password [email or web address] 12/08/2015, 10:01 AM

## 2015-12-08 NOTE — Progress Notes (Signed)
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 furosemide  160 mg Intravenous BID  . furosemide  80 mg Intravenous Once  . heparin  5,000 Units Subcutaneous Q8H  . insulin aspart  0-20 Units Subcutaneous TID WC  . ipratropium-albuterol  3 mL Nebulization TID   Continuous Infusions:  PRN Meds:.acetaminophen **OR** acetaminophen, albuterol, hydrALAZINE, ondansetron **OR** ondansetron (ZOFRAN) IV  Current Labs: reviewed    Physical Exam:  Blood pressure (!) 179/84, pulse (!) 103, temperature 99.7 F (37.6 C), temperature source Oral, resp. rate 18, height 5\' 5"  (1.651 m), weight (!) 219.9 kg (484 lb 14.4 oz), SpO2 95 %. Bariatric bed, obese, NAD 4+ LEE, bandaged on RLE  A 1. 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 supermorbid obesity; bedbound 3. HTN 4. DM2 5. +FHx of ESRD 6. SOB 7. dCHF 8. Remote hx/o CVA  P 1. Follow UOP and SCr with diuresis 2. I'm not sure of the logistics if she needs HD -- might be too heavy for outpt chairs   Sabra Heckyan Corban Kistler MD 12/08/2015, 10:24 AM   Recent Labs Lab 12/07/15 1119 12/07/15 1615 12/07/15 2132 12/08/15 0250  NA 137  --   --  137  K 4.6  --   --  4.5  CL 110  --   --  112*  CO2 17*  --   --  17*  GLUCOSE 130*  --   --  134*  BUN 49*  --   --  52*  CREATININE 5.49*  --  5.56* 5.36*  CALCIUM 8.6*  --   --  8.7*  PHOS  --  4.6  --  4.9*    Recent Labs Lab 12/07/15 1119 12/07/15 2132 12/08/15 0250  WBC 15.3* 14.2* 14.4*   HGB 8.0* 7.7* 7.9*  HCT 25.1* 24.0* 24.5*  MCV 86.3 84.2 84.8  PLT 288 317 314

## 2015-12-08 NOTE — Consult Note (Signed)
WOC Nurse wound consult note Reason for Consult: RLE wound Patient has history of LE wound and it is evident she has venous stasis with lymphedema.  She is currently followed at [facility] and reports she has had some skin grafts in the past. She is seen weekly (when she goes) for multilayer compression application and wound care. She reports the use of bacteriostatic absorptive dressing (hydroferra) on the wound.  Her sister performs her wound care and compression wraps on Friday for her and she has a HHRN on Mondays Wound type: venous stasis Measurement: 3.0 x 1.5cm x 0.2cm  Wound VOZ:DGUYbed:pink, pale Drainage (amount, consistency, odor) minimal Periwound: evidence of significant re-epithelization  Dressing procedure/placement/frequency:  Cut to fit hydroferra blue,pre moistened with saline. Topped with ABD pad. 3 layer compression applied.   WOC or WTA to change dressing and compression wrap M/W/F.    WOC Nurse team will follow along with you for M/W/F dressing changes.  Alexis Reber Eliberto Ivoryustin MSN,RN,CWOCN,CNS [phone]

## 2015-12-09 DIAGNOSIS — N184 Chronic kidney disease, stage 4 (severe): Secondary | ICD-10-CM

## 2015-12-09 LAB — URINE MICROSCOPIC-ADD ON: Bacteria, UA: NONE SEEN

## 2015-12-09 LAB — URINALYSIS, ROUTINE W REFLEX MICROSCOPIC
Bilirubin Urine: NEGATIVE
GLUCOSE, UA: NEGATIVE mg/dL
HGB URINE DIPSTICK: NEGATIVE
KETONES UR: NEGATIVE mg/dL
LEUKOCYTES UA: NEGATIVE
Nitrite: NEGATIVE
PH: 5.5 (ref 5.0–8.0)
Protein, ur: 100 mg/dL — AB
Specific Gravity, Urine: 1.01 (ref 1.005–1.030)

## 2015-12-09 LAB — CBC WITH DIFFERENTIAL/PLATELET
BASOS ABS: 0 10*3/uL (ref 0.0–0.1)
BASOS PCT: 0 %
EOS ABS: 0.1 10*3/uL (ref 0.0–0.7)
Eosinophils Relative: 1 %
HEMATOCRIT: 20.8 % — AB (ref 36.0–46.0)
HEMOGLOBIN: 6.8 g/dL — AB (ref 12.0–15.0)
Lymphocytes Relative: 11 %
Lymphs Abs: 1.1 10*3/uL (ref 0.7–4.0)
MCH: 27.3 pg (ref 26.0–34.0)
MCHC: 32.7 g/dL (ref 30.0–36.0)
MCV: 83.5 fL (ref 78.0–100.0)
Monocytes Absolute: 1 10*3/uL (ref 0.1–1.0)
Monocytes Relative: 10 %
NEUTROS ABS: 7.9 10*3/uL — AB (ref 1.7–7.7)
NEUTROS PCT: 78 %
Platelets: 302 10*3/uL (ref 150–400)
RBC: 2.49 MIL/uL — AB (ref 3.87–5.11)
RDW: 15.1 % (ref 11.5–15.5)
WBC: 10.2 10*3/uL (ref 4.0–10.5)

## 2015-12-09 LAB — GLUCOSE, CAPILLARY
GLUCOSE-CAPILLARY: 107 mg/dL — AB (ref 65–99)
GLUCOSE-CAPILLARY: 117 mg/dL — AB (ref 65–99)
GLUCOSE-CAPILLARY: 146 mg/dL — AB (ref 65–99)
GLUCOSE-CAPILLARY: 132 mg/dL — AB (ref 65–99)
Glucose-Capillary: 117 mg/dL — ABNORMAL HIGH (ref 65–99)
Glucose-Capillary: 117 mg/dL — ABNORMAL HIGH (ref 65–99)

## 2015-12-09 LAB — BASIC METABOLIC PANEL
ANION GAP: 9 (ref 5–15)
BUN: 56 mg/dL — ABNORMAL HIGH (ref 6–20)
CALCIUM: 8.4 mg/dL — AB (ref 8.9–10.3)
CO2: 17 mmol/L — AB (ref 22–32)
Chloride: 112 mmol/L — ABNORMAL HIGH (ref 101–111)
Creatinine, Ser: 5.37 mg/dL — ABNORMAL HIGH (ref 0.44–1.00)
GFR, EST AFRICAN AMERICAN: 11 mL/min — AB (ref >60)
GFR, EST NON AFRICAN AMERICAN: 9 mL/min — AB (ref >60)
Glucose, Bld: 127 mg/dL — ABNORMAL HIGH (ref 65–99)
Potassium: 4.3 mmol/L (ref 3.5–5.1)
SODIUM: 138 mmol/L (ref 135–145)

## 2015-12-09 LAB — CREATININE, URINE, RANDOM: Creatinine, Urine: 64.15 mg/dL

## 2015-12-09 LAB — IRON AND TIBC
Iron: 29 ug/dL (ref 28–170)
SATURATION RATIOS: 23 % (ref 10.4–31.8)
TIBC: 125 ug/dL — ABNORMAL LOW (ref 250–450)
UIBC: 96 ug/dL

## 2015-12-09 LAB — TRANSFERRIN: Transferrin: 89 mg/dL — ABNORMAL LOW (ref 192–382)

## 2015-12-09 LAB — PREPARE RBC (CROSSMATCH)

## 2015-12-09 LAB — FERRITIN: Ferritin: 943 ng/mL — ABNORMAL HIGH (ref 11–307)

## 2015-12-09 LAB — PARATHYROID HORMONE, INTACT (NO CA): PTH: 136 pg/mL — AB (ref 15–65)

## 2015-12-09 MED ORDER — DARBEPOETIN ALFA 60 MCG/0.3ML IJ SOSY
60.0000 ug | PREFILLED_SYRINGE | INTRAMUSCULAR | Status: DC
  Administered 2015-12-09: 60 ug via SUBCUTANEOUS
  Filled 2015-12-09: qty 0.3

## 2015-12-09 MED ORDER — GI COCKTAIL ~~LOC~~
30.0000 mL | Freq: Once | ORAL | Status: AC
  Administered 2015-12-09: 30 mL via ORAL
  Filled 2015-12-09: qty 30

## 2015-12-09 MED ORDER — SODIUM CHLORIDE 0.9 % IV SOLN
Freq: Once | INTRAVENOUS | Status: AC
  Administered 2015-12-09: 14:00:00 via INTRAVENOUS

## 2015-12-09 NOTE — Progress Notes (Signed)
RN noted that new IV site in right upper shoulder was very firm to touch and appears infiltrated.  Blood stopped, patient had received 235 ccs out of a 335 cc bag of PRBC's.  Order put in for IV team consult placed.

## 2015-12-09 NOTE — Progress Notes (Signed)
RN in room, noted Iv was leaking with blood infusing.  Attempted to redress IV and tighten connections however continued to leak.  Blood stopped and IV team notified of need for another IV site stat.

## 2015-12-09 NOTE — Progress Notes (Signed)
Patient refused to sign consent to get blood transfusion until sister is present in the morning. Provider notified.  Petra Kubarica Marshawn Normoyle RN 12/09/2015 4:53 AM

## 2015-12-09 NOTE — Progress Notes (Signed)
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 sodium chloride   Intravenous Once  . amLODipine  10 mg Oral Daily  . carvedilol  25 mg Oral BID WC  . furosemide  160 mg Intravenous BID  . heparin  5,000 Units Subcutaneous Q8H  . insulin aspart  0-20 Units Subcutaneous TID WC  . ipratropium-albuterol  3 mL Nebulization TID  . isosorbide mononitrate  30 mg Oral Daily  . pantoprazole  40 mg Oral Daily   Continuous Infusions:  PRN Meds:.acetaminophen **OR** acetaminophen, albuterol, hydrALAZINE, ondansetron **OR** ondansetron (ZOFRAN) IV  Current Labs: reviewed    Physical Exam:  Blood pressure (!) 171/73, pulse 83, temperature 98.5 F (36.9 C), temperature source Oral, resp. rate 18, height 5\' 5"  (1.651 m), weight (!) 214.6 kg (473 lb), SpO2 99 %. Bariatric bed, obese, NAD 4+ LEE, bandaged on RLE  A 1. 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 supermorbid obesity; bedbound 3. Anemia TSAT 12%, Ferritin 711; 1u PRBC 10/28 4. HTN 5. DM2 6. +FHx of ESRD 7. SOB 8. dCHF 9. Remote hx/o CVA  P 1. Follow UOP and SCr with diuresis 2. Agree with foley and transfusion 3. She needs to lose weight, stopping sugar sweetened beverages is a good first step and advised her to do so immediately. 4. I'm not sure of the logistics if she needs HD -- might be too heavy for outpt chairs 5. Start ESA for CKD   Sabra Heckyan Vinessa Macconnell MD 12/09/2015, 10:01 AM   Recent Labs Lab 12/07/15 1119 12/07/15 1615 12/07/15 2132 12/08/15 0250 12/09/15 0221  NA  137  --   --  137 138  K 4.6  --   --  4.5 4.3  CL 110  --   --  112* 112*  CO2 17*  --   --  17* 17*  GLUCOSE 130*  --   --  134* 127*  BUN 49*  --   --  52* 56*  CREATININE 5.49*  --  5.56* 5.36* 5.37*  CALCIUM 8.6*  --   --  8.7* 8.4*  PHOS  --  4.6  --  4.9*  --     Recent Labs Lab 12/07/15 2132 12/08/15 0250 12/09/15 0221  WBC 14.2* 14.4* 10.2  NEUTROABS  --   --  7.9*  HGB 7.7* 7.9* 6.8*  HCT 24.0* 24.5* 20.8*  MCV 84.2 84.8 83.5  PLT 317 314 302

## 2015-12-09 NOTE — Progress Notes (Addendum)
PROGRESS NOTE    [name]  ZOX:[id number] DOB: 02/12/1975 DOA: 12/07/2015 PCP: Dorrene GermanEdwin A Avbuere, MD    Brief Narrative:  40 yo female with ckd stage IV, presents with generalized weakness, diarrhea and decreased appetite, over 7 days.  On exam noted severe edema on the lower extremities. Worsening renal function to cr up to 5. Started on diuresis.    Assessment & Plan:   Principal Problem:   Dyspnea Active Problems:   Thigh abscess   AKI (acute kidney injury) (HCC)   Anasarca   Asthma   Essential hypertension   CHF (congestive heart failure) (HCC)   1. AKI on CKD stage 4. Unable to measure urine output accurately, will place foley catheter. Cr  Stable at 5.37, K at 4,3 and serum bicarbonate at 17, with persistent non anion gap metabolic acidosis. Clinically hypervolemic. Will continue furosemide for diuresis. Will keep hb above 7, avoid hypotension and nephrotoxic agents.   2. HTN. Systolic blood pressure 140 to 150, will continue blood pressure control with amlodipine, coreg and isosorbide.   3. Asthma. Continue bronchodilator therapy as needed with duonebs. No wheezing, no signs of exacerbation.   4. T2DM. Will continue glucose cover and monitoring. Capillary glucose  107 to 117. Patient tolerating po well.   5. Morbid obesity. dvt px.   6. Right leg, proximal, ulcerated lesion stage 2. No antibiotics indicated, will consult wound care. Continue local wound care.   7. Anemia. Suspected anemia of chronic disease, will transfuse one unit and follow on iron stores pre transfusion.    DVT prophylaxis: heparin.  Code Status: full  Family Communication: no family at the bedside Disposition Plan: home    Consultants:   Nephrology   Procedures:   Antimicrobials:   Subjective: Patient feeling fatigue and tired, no nausea or vomiting. No chest pain or dyspnea. Tolerating po well.   Objective: Vitals:   12/08/15 1939 12/09/15 0051 12/09/15 0538  12/09/15 0854  BP: (!) 164/92 133/77 (!) 171/73   Pulse: 95 82 83   Resp: 18 18 18    Temp:  98.8 F (37.1 C) 98.5 F (36.9 C)   TempSrc:  Oral Oral   SpO2: 95% 100% 98% 99%  Weight:   (!) 214.6 kg (473 lb)   Height:        Intake/Output Summary (Last 24 hours) at 12/09/15 0855 Last data filed at 12/09/15 0603  Gross per 24 hour  Intake              240 ml  Output                0 ml  Net              240 ml   Filed Weights   12/07/15 0955 12/08/15 0454 12/09/15 0538  Weight: (!) 158.8 kg (350 lb) (!) 219.9 kg (484 lb 14.4 oz) (!) 214.6 kg (473 lb)    Examination:  General exam: deconditioned, not in pain or dyspnea E ENT. Mild conjunctival pallor, oral mucosa moist.  Respiratory system: decrease breath sounds at bases, no wheezing, or rales.  Cardiovascular system: S1 & S2 heard, RRR. No JVD, murmurs, rubs, gallops or clicks. Pitting edema +++. Gastrointestinal system: Abdomen is nondistended, soft and nontender. No organomegaly or masses felt. Normal bowel sounds heard. Central nervous system: Alert and oriented. No focal neurological deficits. Extremities: Symmetric 5 x 5 power. Skin: No rashes, lesions or ulcers    Data Reviewed: I have personally  reviewed following labs and imaging studies  CBC:  Recent Labs Lab 12/07/15 1119 12/07/15 2132 12/08/15 0250 12/09/15 0221  WBC 15.3* 14.2* 14.4* 10.2  NEUTROABS  --   --   --  7.9*  HGB 8.0* 7.7* 7.9* 6.8*  HCT 25.1* 24.0* 24.5* 20.8*  MCV 86.3 84.2 84.8 83.5  PLT 288 317 314 302   Basic Metabolic Panel:  Recent Labs Lab 12/07/15 1119 12/07/15 1615 12/07/15 2132 12/08/15 0250 12/09/15 0221  NA 137  --   --  137 138  K 4.6  --   --  4.5 4.3  CL 110  --   --  112* 112*  CO2 17*  --   --  17* 17*  GLUCOSE 130*  --   --  134* 127*  BUN 49*  --   --  52* 56*  CREATININE 5.49*  --  5.56* 5.36* 5.37*  CALCIUM 8.6*  --   --  8.7* 8.4*  MG  --  2.0  --   --   --   PHOS  --  4.6  --  4.9*  --     GFR: Estimated Creatinine Clearance: 26.6 mL/min (by C-G formula based on SCr of 5.37 mg/dL (H)). Liver Function Tests:  Recent Labs Lab 12/08/15 0250  ALBUMIN 2.2*   No results for input(s): LIPASE, AMYLASE in the last 168 hours. No results for input(s): AMMONIA in the last 168 hours. Coagulation Profile: No results for input(s): INR, PROTIME in the last 168 hours. Cardiac Enzymes:  Recent Labs Lab 12/07/15 1615 12/07/15 2132 12/08/15 0250  TROPONINI 0.04* 0.19* 0.09*   BNP (last 3 results) No results for input(s): PROBNP in the last 8760 hours. HbA1C: No results for input(s): HGBA1C in the last 72 hours. CBG:  Recent Labs Lab 12/08/15 0624 12/08/15 1104 12/08/15 1727 12/08/15 2117 12/09/15 0604  GLUCAP 133* 140* 126* 125* 107*   Lipid Profile: No results for input(s): CHOL, HDL, LDLCALC, TRIG, CHOLHDL, LDLDIRECT in the last 72 hours. Thyroid Function Tests: No results for input(s): TSH, T4TOTAL, FREET4, T3FREE, THYROIDAB in the last 72 hours. Anemia Panel:  Recent Labs  12/07/15 2132  FERRITIN 711*  TIBC 129*  IRON 16*   Sepsis Labs:  Recent Labs Lab 12/07/15 1126 12/07/15 1620 12/07/15 2132  LATICACIDVEN 0.73 1.0 0.9    No results found for this or any previous visit (from the past 240 hour(s)).       Radiology Studies: Dg Chest 2 View  Result Date: 12/07/2015 CLINICAL DATA:  Persistent shortness of breath for 3 days. Hypertension, CHF, diabetes EXAM: CHEST  2 VIEW COMPARISON:  02/20/2015 FINDINGS: Limited exam because of obese body habitus. Cardiomegaly evident with vascular congestion centrally. Low lung volumes noted. No definite focal pneumonia, collapse or consolidation. No current edema, large effusion or pneumothorax. Trachea is midline. Lateral view is limited because of underexposure. IMPRESSION: Cardiomegaly with vascular congestion and low lung volumes. No current significant CHF or pneumonia. Electronically Signed   By: Judie PetitM.   Shick M.D.   On: 12/07/2015 10:45        Scheduled Meds: . sodium chloride   Intravenous Once  . amLODipine  10 mg Oral Daily  . carvedilol  25 mg Oral BID WC  . furosemide  160 mg Intravenous BID  . heparin  5,000 Units Subcutaneous Q8H  . insulin aspart  0-20 Units Subcutaneous TID WC  . ipratropium-albuterol  3 mL Nebulization TID  . isosorbide mononitrate  30 mg  Oral Daily  . pantoprazole  40 mg Oral Daily   Continuous Infusions:    LOS: 1 day        Amedio Bowlby Annett Gula, MD [facility] Pager [phone]  If 7PM-7AM, please contact night-coverage [email or web address] Password [email or web address] 12/09/2015, 8:55 AM

## 2015-12-09 NOTE — Progress Notes (Signed)
Patient hgb 6.8. Notified provider of lab result. Received order to transfuse 1 unit of blood. Patient is not symptomatic. Will continue to monitor. Petra Kubarica Brandilynn Taormina RN 12/09/2015 3:23 AM

## 2015-12-10 LAB — TYPE AND SCREEN
ABO/RH(D): A POS
ANTIBODY SCREEN: NEGATIVE
UNIT DIVISION: 0

## 2015-12-10 LAB — CBC WITH DIFFERENTIAL/PLATELET
Basophils Absolute: 0 10*3/uL (ref 0.0–0.1)
Basophils Relative: 0 %
EOS PCT: 2 %
Eosinophils Absolute: 0.2 10*3/uL (ref 0.0–0.7)
HCT: 23 % — ABNORMAL LOW (ref 36.0–46.0)
Hemoglobin: 7.5 g/dL — ABNORMAL LOW (ref 12.0–15.0)
LYMPHS ABS: 1.4 10*3/uL (ref 0.7–4.0)
LYMPHS PCT: 15 %
MCH: 27.1 pg (ref 26.0–34.0)
MCHC: 32.6 g/dL (ref 30.0–36.0)
MCV: 83 fL (ref 78.0–100.0)
MONOS PCT: 9 %
Monocytes Absolute: 0.9 10*3/uL (ref 0.1–1.0)
Neutro Abs: 6.7 10*3/uL (ref 1.7–7.7)
Neutrophils Relative %: 74 %
PLATELETS: 328 10*3/uL (ref 150–400)
RBC: 2.77 MIL/uL — AB (ref 3.87–5.11)
RDW: 15.2 % (ref 11.5–15.5)
WBC: 9.1 10*3/uL (ref 4.0–10.5)

## 2015-12-10 LAB — BASIC METABOLIC PANEL
Anion gap: 8 (ref 5–15)
BUN: 59 mg/dL — AB (ref 6–20)
CHLORIDE: 113 mmol/L — AB (ref 101–111)
CO2: 16 mmol/L — ABNORMAL LOW (ref 22–32)
CREATININE: 5.3 mg/dL — AB (ref 0.44–1.00)
Calcium: 8.3 mg/dL — ABNORMAL LOW (ref 8.9–10.3)
GFR calc Af Amer: 11 mL/min — ABNORMAL LOW (ref >60)
GFR, EST NON AFRICAN AMERICAN: 9 mL/min — AB (ref >60)
GLUCOSE: 115 mg/dL — AB (ref 65–99)
POTASSIUM: 4.1 mmol/L (ref 3.5–5.1)
Sodium: 137 mmol/L (ref 135–145)

## 2015-12-10 LAB — GLUCOSE, CAPILLARY
GLUCOSE-CAPILLARY: 125 mg/dL — AB (ref 65–99)
GLUCOSE-CAPILLARY: 112 mg/dL — AB (ref 65–99)
GLUCOSE-CAPILLARY: 134 mg/dL — AB (ref 65–99)
Glucose-Capillary: 126 mg/dL — ABNORMAL HIGH (ref 65–99)
Glucose-Capillary: 135 mg/dL — ABNORMAL HIGH (ref 65–99)

## 2015-12-10 LAB — UREA NITROGEN, URINE: Urea Nitrogen, Ur: 243 mg/dL

## 2015-12-10 MED ORDER — GI COCKTAIL ~~LOC~~
30.0000 mL | Freq: Once | ORAL | Status: AC
  Administered 2015-12-10: 30 mL via ORAL
  Filled 2015-12-10: qty 30

## 2015-12-10 MED ORDER — ZOLPIDEM TARTRATE 5 MG PO TABS
5.0000 mg | ORAL_TABLET | Freq: Once | ORAL | Status: AC
  Administered 2015-12-10: 5 mg via ORAL
  Filled 2015-12-10: qty 1

## 2015-12-10 MED ORDER — FUROSEMIDE 10 MG/ML IJ SOLN
160.0000 mg | Freq: Three times a day (TID) | INTRAVENOUS | Status: DC
  Administered 2015-12-10 – 2015-12-11 (×2): 160 mg via INTRAVENOUS
  Filled 2015-12-10 (×5): qty 16

## 2015-12-10 MED ORDER — ZOLPIDEM TARTRATE 5 MG PO TABS
5.0000 mg | ORAL_TABLET | Freq: Every evening | ORAL | Status: DC | PRN
  Administered 2015-12-10: 5 mg via ORAL
  Filled 2015-12-10: qty 1

## 2015-12-10 NOTE — Progress Notes (Signed)
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 amLODipine  10 mg Oral Daily  . carvedilol  25 mg Oral BID WC  . darbepoetin (ARANESP) injection - NON-DIALYSIS  60 mcg Subcutaneous Q Sat-1800  . furosemide  160 mg Intravenous BID  . heparin  5,000 Units Subcutaneous Q8H  . insulin aspart  0-20 Units Subcutaneous TID WC  . isosorbide mononitrate  30 mg Oral Daily  . pantoprazole  40 mg Oral Daily   Continuous Infusions:  PRN Meds:.acetaminophen **OR** acetaminophen, albuterol, hydrALAZINE, ondansetron **OR** ondansetron (ZOFRAN) IV  Current Labs: reviewed    Physical Exam:  Blood pressure 122/79, pulse 80, temperature 98.2 F (36.8 C), temperature source Oral, resp. rate 18, height 5\' 5"  (1.651 m), weight (!) 214.6 kg (473 lb), SpO2 95 %. Bariatric bed, obese, NAD 4+ LEE, bandaged on RLE  A 1. 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 supermorbid obesity; bedbound 3. Anemia TSAT 12%, Ferritin 711; 1u PRBC 10/28; started ESA 10/28 4. HTN 5. DM2 6. +FHx of ESRD 7. SOB 8. dCHF 9. Remote hx/o CVA  P 1. Inc to TID lasix 2. I'm not sure of the logistics if she needs HD -- might be too heavy for outpt chairs and outpt HD might be currently untenable 3. Daily weights, Daily Renal Panel, Strict I/Os, Avoid nephrotoxins (NSAIDs, judicious IV Contrast)    Sabra Heckyan Kassidi Elza MD 12/10/2015, 8:54 AM   Recent Labs Lab 12/07/15 1615  12/08/15 0250 12/09/15 0221 12/10/15 0721  NA  --   --  137 138 137  K  --   --  4.5 4.3 4.1  CL  --   --  112* 112* 113*  CO2   --   --  17* 17* 16*  GLUCOSE  --   --  134* 127* 115*  BUN  --   --  52* 56* 59*  CREATININE  --   < > 5.36* 5.37* 5.30*  CALCIUM  --   --  8.7* 8.4* 8.3*  PHOS 4.6  --  4.9*  --   --   < > = values in this interval not displayed.  Recent Labs Lab 12/08/15 0250 12/09/15 0221 12/10/15 0721  WBC 14.4* 10.2 9.1  NEUTROABS  --  7.9* 6.7  HGB 7.9* 6.8* 7.5*  HCT 24.5* 20.8* 23.0*  MCV 84.8 83.5 83.0  PLT 314 302 328

## 2015-12-10 NOTE — Progress Notes (Signed)
PROGRESS NOTE    [name]  ZOX:[id number] DOB: 11/02/1975 DOA: 12/07/2015 PCP: Dorrene GermanEdwin A Avbuere, MD    Brief Narrative:  40 yo female with ckd stage IV, presents with generalized weakness, diarrhea and decreased appetite, over 7 days. On exam noted severe edema on the lower extremities. Worsening renal function to cr up to 5. Started on diuresis.    Assessment & Plan:   Principal Problem:   Dyspnea Active Problems:   Thigh abscess   AKI (acute kidney injury) (HCC)   Anasarca   Asthma   Essential hypertension   CHF (congestive heart failure) (HCC)   1. AKI on CKD stage 4. Foley catheter in place, urine output  1225 since admission, renal function with cr  Stable at 5.30, K at 4,1 and serum bicarbonate at 16, with persistent non anion gap metabolic acidosis. Clinically patient continue to be hypervolemic. Continue diuresis with furosemide at 160 mg IV tid.   2. HTN.Systolic blood pressure 150 to 170, will continue amlodipine, coreg and isosorbide.   3. Asthma. Bronchodilator therapy as needed with duonebs. No wheezing, no signs of exacerbation.   4. T2DM. Will continue glucose cover and monitoring. Capillary glucose [id number]. Patient tolerating po well.   5. Morbid obesity. dvt px.   6. Right leg, proximal, ulcerated lesion stage 2. No antibiotics indicated, will consult wound care. Continue local wound care. Patient is not ambulatory, bed bound.   7. Anemia. Hb up to 7.5, with hct at 23. Post one unit prbc, Iron panel cw anemic of chronic disease with low tibc, high ferritin and high transferrin saturation.  Started on aranesp.   8. Dyspepsia and reflux. Will continue antiemetics and antiacids. Diet as tolerated.    DVT prophylaxis: heparin.  Code Status: full  Family Communication:no family at the bedside Disposition Plan: home    Consultants:   Nephrology   Procedures:   Antimicrobials:     Subjective: Patient with moderate  orthopnea, no chest pain but persistent dyspepsia. Positive nausea but no vomiting. Improved with anti-acids.    Objective: Vitals:   12/09/15 2056 12/10/15 0000 12/10/15 0200 12/10/15 0654  BP: (!) 149/69 (!) 153/86 130/67 122/79  Pulse: 79 81 80 80  Resp: 20 20 20 18   Temp: 98.2 F (36.8 C) 98.4 F (36.9 C)  98.2 F (36.8 C)  TempSrc: Oral Oral  Oral  SpO2: 95% 100% 97% 95%  Weight:    (!) 214.6 kg (473 lb)  Height:        Intake/Output Summary (Last 24 hours) at 12/10/15 1022 Last data filed at 12/10/15 0912  Gross per 24 hour  Intake              595 ml  Output             1000 ml  Net             -405 ml   Filed Weights   12/08/15 0454 12/09/15 0538 12/10/15 0654  Weight: (!) 219.9 kg (484 lb 14.4 oz) (!) 214.6 kg (473 lb) (!) 214.6 kg (473 lb)    Examination:  General exam: deconditioned and ill looking appearing.  Respiratory system: poor ventilation, decreased breath sounds at the dependent zones. No wheezing or rhonchi. Cardiovascular system: S1 & S2 heard, RRR. No JVD, murmurs, rubs, gallops or clicks. Severe pitting +++ pitting edema. Gastrointestinal system: Abdomen is nondistended, soft and nontender. No organomegaly or masses felt. Normal bowel sounds heard. Central nervous system: Alert and  oriented. No focal neurological deficits. Extremities: Symmetric 5 x 5 power. Skin: No rashes, lesions or ulcer    Data Reviewed: I have personally reviewed following labs and imaging studies  CBC:  Recent Labs Lab 12/07/15 1119 12/07/15 2132 12/08/15 0250 12/09/15 0221 12/10/15 0721  WBC 15.3* 14.2* 14.4* 10.2 9.1  NEUTROABS  --   --   --  7.9* 6.7  HGB 8.0* 7.7* 7.9* 6.8* 7.5*  HCT 25.1* 24.0* 24.5* 20.8* 23.0*  MCV 86.3 84.2 84.8 83.5 83.0  PLT 288 317 314 302 328   Basic Metabolic Panel:  Recent Labs Lab 12/07/15 1119 12/07/15 1615 12/07/15 2132 12/08/15 0250 12/09/15 0221 12/10/15 0721  NA 137  --   --  137 138 137  K 4.6  --   --  4.5 4.3  4.1  CL 110  --   --  112* 112* 113*  CO2 17*  --   --  17* 17* 16*  GLUCOSE 130*  --   --  134* 127* 115*  BUN 49*  --   --  52* 56* 59*  CREATININE 5.49*  --  5.56* 5.36* 5.37* 5.30*  CALCIUM 8.6*  --   --  8.7* 8.4* 8.3*  MG  --  2.0  --   --   --   --   PHOS  --  4.6  --  4.9*  --   --    GFR: Estimated Creatinine Clearance: 27 mL/min (by C-G formula based on SCr of 5.3 mg/dL (H)). Liver Function Tests:  Recent Labs Lab 12/08/15 0250  ALBUMIN 2.2*   No results for input(s): LIPASE, AMYLASE in the last 168 hours. No results for input(s): AMMONIA in the last 168 hours. Coagulation Profile: No results for input(s): INR, PROTIME in the last 168 hours. Cardiac Enzymes:  Recent Labs Lab 12/07/15 1615 12/07/15 2132 12/08/15 0250  TROPONINI 0.04* 0.19* 0.09*   BNP (last 3 results) No results for input(s): PROBNP in the last 8760 hours. HbA1C: No results for input(s): HGBA1C in the last 72 hours. CBG:  Recent Labs Lab 12/09/15 1134 12/09/15 1619 12/09/15 2055 12/10/15 0212 12/10/15 0652  GLUCAP 117*  117*  117* 146* 132* 126* 125*   Lipid Profile: No results for input(s): CHOL, HDL, LDLCALC, TRIG, CHOLHDL, LDLDIRECT in the last 72 hours. Thyroid Function Tests: No results for input(s): TSH, T4TOTAL, FREET4, T3FREE, THYROIDAB in the last 72 hours. Anemia Panel:  Recent Labs  12/07/15 2132 12/09/15 1522  FERRITIN 711* 943*  TIBC 129* 125*  IRON 16* 29   Sepsis Labs:  Recent Labs Lab 12/07/15 1126 12/07/15 1620 12/07/15 2132  LATICACIDVEN 0.73 1.0 0.9    No results found for this or any previous visit (from the past 240 hour(s)).       Radiology Studies: No results found.      Scheduled Meds: . amLODipine  10 mg Oral Daily  . carvedilol  25 mg Oral BID WC  . darbepoetin (ARANESP) injection - NON-DIALYSIS  60 mcg Subcutaneous Q Sat-1800  . furosemide  160 mg Intravenous TID  . gi cocktail  30 mL Oral Once  . heparin  5,000 Units  Subcutaneous Q8H  . insulin aspart  0-20 Units Subcutaneous TID WC  . isosorbide mononitrate  30 mg Oral Daily  . pantoprazole  40 mg Oral Daily   Continuous Infusions:    LOS: 2 days        Mauricio Annett Gulaaniel Arrien, MD [facility] Pager [phone]  If 7PM-7AM, please contact night-coverage [email or web address] Password [email or web address] 12/10/2015, 10:22 AM

## 2015-12-10 NOTE — Progress Notes (Signed)
Patient with very large BM.  Dressing to R leg soiled, removed dressing, leg dry, wrapped with Kerlix.

## 2015-12-11 LAB — BASIC METABOLIC PANEL
ANION GAP: 8 (ref 5–15)
BUN: 60 mg/dL — ABNORMAL HIGH (ref 6–20)
CALCIUM: 8.4 mg/dL — AB (ref 8.9–10.3)
CHLORIDE: 112 mmol/L — AB (ref 101–111)
CO2: 18 mmol/L — AB (ref 22–32)
Creatinine, Ser: 5.34 mg/dL — ABNORMAL HIGH (ref 0.44–1.00)
GFR calc Af Amer: 11 mL/min — ABNORMAL LOW (ref >60)
GFR calc non Af Amer: 9 mL/min — ABNORMAL LOW (ref >60)
Glucose, Bld: 115 mg/dL — ABNORMAL HIGH (ref 65–99)
Potassium: 4 mmol/L (ref 3.5–5.1)
Sodium: 138 mmol/L (ref 135–145)

## 2015-12-11 LAB — CBC WITH DIFFERENTIAL/PLATELET
BASOS PCT: 0 %
Basophils Absolute: 0 10*3/uL (ref 0.0–0.1)
Eosinophils Absolute: 0.3 10*3/uL (ref 0.0–0.7)
Eosinophils Relative: 3 %
HEMATOCRIT: 24.4 % — AB (ref 36.0–46.0)
HEMOGLOBIN: 7.8 g/dL — AB (ref 12.0–15.0)
Lymphocytes Relative: 16 %
Lymphs Abs: 1.8 10*3/uL (ref 0.7–4.0)
MCH: 27.9 pg (ref 26.0–34.0)
MCHC: 32 g/dL (ref 30.0–36.0)
MCV: 87.1 fL (ref 78.0–100.0)
MONOS PCT: 8 %
Monocytes Absolute: 0.8 10*3/uL (ref 0.1–1.0)
NEUTROS PCT: 73 %
Neutro Abs: 8 10*3/uL — ABNORMAL HIGH (ref 1.7–7.7)
Platelets: 412 10*3/uL — ABNORMAL HIGH (ref 150–400)
RBC: 2.8 MIL/uL — AB (ref 3.87–5.11)
RDW: 15.6 % — ABNORMAL HIGH (ref 11.5–15.5)
WBC: 11 10*3/uL — ABNORMAL HIGH (ref 4.0–10.5)

## 2015-12-11 LAB — GLUCOSE, CAPILLARY
GLUCOSE-CAPILLARY: 119 mg/dL — AB (ref 65–99)
GLUCOSE-CAPILLARY: 119 mg/dL — AB (ref 65–99)
Glucose-Capillary: 133 mg/dL — ABNORMAL HIGH (ref 65–99)
Glucose-Capillary: 110 mg/dL — ABNORMAL HIGH (ref 65–99)

## 2015-12-11 MED ORDER — METOLAZONE 5 MG PO TABS
5.0000 mg | ORAL_TABLET | Freq: Two times a day (BID) | ORAL | Status: AC
  Administered 2015-12-11 – 2015-12-12 (×3): 5 mg via ORAL
  Filled 2015-12-11 (×3): qty 1

## 2015-12-11 MED ORDER — TORSEMIDE 20 MG PO TABS
20.0000 mg | ORAL_TABLET | Freq: Two times a day (BID) | ORAL | Status: DC
  Administered 2015-12-11 – 2015-12-12 (×2): 20 mg via ORAL
  Filled 2015-12-11 (×2): qty 1

## 2015-12-11 NOTE — Progress Notes (Signed)
IV access lost.  IV team ordered to place another IV.  IV team used US and was not successful.  MD notified and changed diuretics to PO.  MD ok with no access.  Will continue to monitor.

## 2015-12-11 NOTE — Progress Notes (Signed)
PROGRESS NOTE    [name]  XBJ:[id number] DOB: 01/02/1976 DOA: 12/07/2015 PCP: Dorrene GermanEdwin A Avbuere, MD    Brief Narrative:  40 yo female with ckd stage IV, presents with generalized weakness, diarrhea and decreased appetite, over 7 days. On exam noted severe edema on the lower extremities. Worsening renal function to cr up to 5. On high dose of furosemide, urine output improved, up to about 2L.    Assessment & Plan:   Principal Problem:   Dyspnea Active Problems:   Thigh abscess   AKI (acute kidney injury) (HCC)   Anasarca   Asthma   Essential hypertension   CHF (congestive heart failure) (HCC)   1. AKI on CKD stage 4. Foley catheter in place, urine output 1200 cc over last 24 hours, renal function with cr stable at 5.34, K at 4,0 and serum bicarbonate at 18, with persistent non anion gap metabolic acidosis. Patient has lost IV access, will avoid picc line, will start patient on torsemide and continue on metolazone, will continue to follow recommendation from nephrology. Patient clinically feeling better.   2. HTN.Systolic blood pressure 120's systolic, continue amlodipine, coreg and isosorbide. Continue aggressive diuresis, target negative fluid balance.   3. Asthma. Stable with no signs of exacerbations will continue Bronchodilator therapy with duonebs.  4. T2DM. Will continue glucose cover and monitoring. Capillary glucose [id number]. Patient tolerating po well.   5. Morbid obesity. dvt px.   6. Right leg, proximal, ulcerated lesion stage 2. Continue local wound care.  Patient is not ambulatory, bed bound.   7. Anemia of chronic disease. Sp 1 unit prbc transfusion. Hb stable at 7.8 and  hct at 24. Continue aranesp.  8. Dyspepsia and reflux. Improved with no further pain or dyspepsia. Tolerating po well.   DVT prophylaxis: heparin.  Code Status: full  Family Communication:no family at the bedside Disposition Plan: home     Consultants:  Nephrology   Subjective: Patient feeling better, dyspnea has improved, no chest pain. Persistent lower extremities edema. Dyspepsia has improved, no nausea or vomiting.   Objective: Vitals:   12/10/15 1227 12/10/15 2247 12/11/15 0419 12/11/15 0634  BP: (!) 171/94 140/75 (!) 149/68 127/62  Pulse: 87 75 75 73  Resp: 16 18 18    Temp: 97.7 F (36.5 C) 98.4 F (36.9 C) 98.1 F (36.7 C)   TempSrc: Oral Oral Oral   SpO2: 100% 98% 100%   Weight:      Height:        Intake/Output Summary (Last 24 hours) at 12/11/15 0917 Last data filed at 12/11/15 0600  Gross per 24 hour  Intake              770 ml  Output             1450 ml  Net             -680 ml   Filed Weights   12/08/15 0454 12/09/15 0538 12/10/15 0654  Weight: (!) 219.9 kg (484 lb 14.4 oz) (!) 214.6 kg (473 lb) (!) 214.6 kg (473 lb)    Examination:  General exam: not in pain or dyspnea. E ENT: mild pallor but no icterus, oral mucosa moist. Respiratory system: Decreased breath sounds at the dependent zones, no wheezing, or rhonchi. Respiratory effort normal. Cardiovascular system: S1 & S2 heard, RRR. No JVD, murmurs, rubs, gallops or clicks. ++++ pitting edema. Gastrointestinal system: Abdomen is nondistended, soft and nontender. No organomegaly or masses felt. Normal bowel sounds heard.  Central nervous system: Alert and oriented. No focal neurological deficits. Extremities: Symmetric 5 x 5 power. Skin: No rashes, lesions or ulcers    Data Reviewed: I have personally reviewed following labs and imaging studies  CBC:  Recent Labs Lab 12/07/15 1119 12/07/15 2132 12/08/15 0250 12/09/15 0221 12/10/15 0721  WBC 15.3* 14.2* 14.4* 10.2 9.1  NEUTROABS  --   --   --  7.9* 6.7  HGB 8.0* 7.7* 7.9* 6.8* 7.5*  HCT 25.1* 24.0* 24.5* 20.8* 23.0*  MCV 86.3 84.2 84.8 83.5 83.0  PLT 288 317 314 302 328   Basic Metabolic Panel:  Recent Labs Lab 12/07/15 1119 12/07/15 1615 12/07/15 2132  12/08/15 0250 12/09/15 0221 12/10/15 0721  NA 137  --   --  137 138 137  K 4.6  --   --  4.5 4.3 4.1  CL 110  --   --  112* 112* 113*  CO2 17*  --   --  17* 17* 16*  GLUCOSE 130*  --   --  134* 127* 115*  BUN 49*  --   --  52* 56* 59*  CREATININE 5.49*  --  5.56* 5.36* 5.37* 5.30*  CALCIUM 8.6*  --   --  8.7* 8.4* 8.3*  MG  --  2.0  --   --   --   --   PHOS  --  4.6  --  4.9*  --   --    GFR: Estimated Creatinine Clearance: 27 mL/min (by C-G formula based on SCr of 5.3 mg/dL (H)). Liver Function Tests:  Recent Labs Lab 12/08/15 0250  ALBUMIN 2.2*   No results for input(s): LIPASE, AMYLASE in the last 168 hours. No results for input(s): AMMONIA in the last 168 hours. Coagulation Profile: No results for input(s): INR, PROTIME in the last 168 hours. Cardiac Enzymes:  Recent Labs Lab 12/07/15 1615 12/07/15 2132 12/08/15 0250  TROPONINI 0.04* 0.19* 0.09*   BNP (last 3 results) No results for input(s): PROBNP in the last 8760 hours. HbA1C: No results for input(s): HGBA1C in the last 72 hours. CBG:  Recent Labs Lab 12/10/15 0652 12/10/15 1153 12/10/15 1636 12/10/15 2250 12/11/15 0546  GLUCAP 125* 112* 134* 135* 133*   Lipid Profile: No results for input(s): CHOL, HDL, LDLCALC, TRIG, CHOLHDL, LDLDIRECT in the last 72 hours. Thyroid Function Tests: No results for input(s): TSH, T4TOTAL, FREET4, T3FREE, THYROIDAB in the last 72 hours. Anemia Panel:  Recent Labs  12/09/15 1522  FERRITIN 943*  TIBC 125*  IRON 29   Sepsis Labs:  Recent Labs Lab 12/07/15 1126 12/07/15 1620 12/07/15 2132  LATICACIDVEN 0.73 1.0 0.9    No results found for this or any previous visit (from the past 240 hour(s)).       Radiology Studies: No results found.      Scheduled Meds: . amLODipine  10 mg Oral Daily  . carvedilol  25 mg Oral BID WC  . darbepoetin (ARANESP) injection - NON-DIALYSIS  60 mcg Subcutaneous Q Sat-1800  . furosemide  160 mg Intravenous TID  .  heparin  5,000 Units Subcutaneous Q8H  . insulin aspart  0-20 Units Subcutaneous TID WC  . isosorbide mononitrate  30 mg Oral Daily  . metolazone  5 mg Oral BID  . pantoprazole  40 mg Oral Daily   Continuous Infusions:    LOS: 3 days       Vincenza Dail Annett Gula, MD [facility] Pager [phone]  If 7PM-7AM, please contact night-coverage  [email or web address] Password [email or web address] 12/11/2015, 9:17 AM

## 2015-12-11 NOTE — Progress Notes (Signed)
Rehab Admissions Coordinator Note:  Patient was screened by Trish MageLogue, Artem Bunte M for appropriateness for an Inpatient Acute Rehab Consult.  At this time, we are recommending Inpatient Rehab consult.  Trish MageLogue, Rayford Williamsen M 12/11/2015, 3:11 PM  I can be reached at [phone].

## 2015-12-11 NOTE — Consult Note (Signed)
WOC Nurse wound follow up note: Patient has history of LE wound and it is evident she has venous stasis with lymphedema.  Wound type: venous stasis Measurement: 2.5cm x 1.5cm x 0.1cm  Wound WGN:FAOZbed:pink, pale Drainage (amount, consistency, odor) minimal Periwound: evidence of significant re-epithelization  Dressing procedure/placement/frequency:  Cut to fit hydroferra blue,pre moistened with saline. Topped with ABD pad. 3 layer compression applied.   WOC or WTA to change dressing and compression wrap M/W/F.    WOC Nurse team will follow along with you for M/W/F dressing changes.  Shelby Anderle Eliberto Ivoryustin MSN,RN,CWOCN,CNS [phone]

## 2015-12-11 NOTE — Consult Note (Signed)
Physical Medicine and Rehabilitation Consult   Reason for Consult: Debilty  Referring Physician:  Dr. Ella Jubilee.    HPI: [name] is a 40 y.o. female with history of CVA with RLE weakness, CKD stage 4, morbid obesity, HTN, DM, chronic Right calf ulcer who was admitted on 12/07/15 with decrease in po intake and acute on chronic renal failure due to diabetic nephropathy v/s obesity associated nephropathy.  She was started on IV diuresis to help with volume overload as well as aranesp for anemia of chronic disease. WOC consulted for input on statis ulcer with lymphedema and compressive wrap placed with recommendations for MWF changes. PT evaluation done and CIR recommended   Patient has been bed bound for past 2 weeks. She was able to walk short distances in home and is sedentary. Uses wheelchair at times.  Husband performs ADLS and helps with toileting. Sister assists with home management.   Review of Systems  Constitutional: Negative for weight loss.  HENT: Negative for hearing loss.   Eyes: Negative for blurred vision and double vision.  Respiratory: Negative for cough, shortness of breath and wheezing.   Cardiovascular: Positive for leg swelling.  Gastrointestinal: Negative for abdominal pain, heartburn and nausea.  Genitourinary: Negative for dysuria.  Musculoskeletal: Positive for myalgias.  Skin: Negative for itching and rash.  Neurological: Positive for weakness. Negative for dizziness, tingling and headaches.  Psychiatric/Behavioral: The patient is nervous/anxious and has insomnia.   All other systems reviewed and are negative.   Past Medical History:  Diagnosis Date  . Asthma   . CHF (congestive heart failure) (HCC)    diastolic  . CVA (cerebral infarction) 1990's   "writing is not the same since" R leg weakness   . GERD (gastroesophageal reflux disease)   . Hypertension   . Morbid obesity (HCC)   . Stroke (HCC)   . Type II diabetes mellitus (HCC)     Past Surgical History:  Procedure Laterality Date  . INCISION AND DRAINAGE OF WOUND Left 08/30/2013   thigh necrotic wound/notes 08/30/2013  . IRRIGATION AND DEBRIDEMENT ABSCESS Left 08/30/2013   Procedure: IRRIGATION AND DEBRIDEMENT ABSCESS Left Thigh Necrotic Wound;  Surgeon: [name], MD;  Location: [facility] OR;  Service: General;  Laterality: Left;  . TEE WITHOUT CARDIOVERSION N/A 09/07/2013   Procedure: TRANSESOPHAGEAL ECHOCARDIOGRAM (TEE);  Surgeon: [name], MD;  Location: [facility] ENDOSCOPY;  Service: Cardiovascular;  Laterality: N/A;  . TEE WITHOUT CARDIOVERSION N/A 02/08/2015   Procedure: TRANSESOPHAGEAL ECHOCARDIOGRAM (TEE);  Surgeon: Chilton Si, MD;  Location: [facility] ENDOSCOPY;  Service: Cardiovascular;  Laterality: N/A;   Family History  Problem Relation Age of Onset  . Diabetes Mother   . Kidney disease Mother     Social History:  Married. Husband at home looks and looks after children and manages home. She reports that she has never smoked. She has never used smokeless tobacco. She reports that she does not drink alcohol or use drugs.     Allergies  Allergen Reactions  . Azithromycin Other (See Comments)    Nose bleeding event   Medications Prior to Admission  Medication Sig Dispense Refill  . acetaminophen (TYLENOL) 500 MG tablet Take 1,000 mg by mouth every 6 (six) hours as needed for fever (pain).     Marland Kitchen albuterol (PROAIR HFA) 108 (90 Base) MCG/ACT inhaler Inhale 2 puffs into the lungs every 6 (six) hours as needed for wheezing or shortness of breath. 1 Inhaler 0  . albuterol (PROVENTIL) (2.5  MG/3ML) 0.083% nebulizer solution Take 3 mLs (2.5 mg total) by nebulization every 6 (six) hours as needed for wheezing or shortness of breath. 75 mL 0  . amLODipine (NORVASC) 10 MG tablet Take 1 tablet (10 mg total) by mouth daily. 30 tablet 0  . Ascorbic Acid (VITAMIN C) 1000 MG tablet Take 1,000 mg by mouth daily.    . carvedilol (COREG) 25 MG tablet Take 1 tablet (25 mg  total) by mouth 2 (two) times daily with a meal. 60 tablet 1  . ferrous sulfate 325 (65 FE) MG tablet Take 1 tablet (325 mg total) by mouth 3 (three) times daily with meals. 90 tablet 3  . furosemide (LASIX) 80 MG tablet Take 2 tablets (160 mg total) by mouth 2 (two) times daily. 120 tablet 0  . hydrALAZINE (APRESOLINE) 50 MG tablet Take 50 mg by mouth 3 (three) times daily.    . insulin aspart (NOVOLOG) 100 UNIT/ML injection Inject 0-20 Units into the skin 3 (three) times daily with meals. CBG < 70: implement hypoglycemia protocol CBG 70 - 120: 0 units CBG 121 - 150: 3 units CBG 151 - 200: 4 units CBG 201 - 250: 7 units CBG 251 - 300: 11 units CBG 301 - 350: 15 units CBG 351 - 400: 20 units    . insulin glargine (LANTUS) 100 UNIT/ML injection Inject 0.3 mLs (30 Units total) into the skin daily. (Patient taking differently: Inject 15 Units into the skin at bedtime as needed (CBG >140). ) 10 mL 3  . isosorbide mononitrate (IMDUR) 30 MG 24 hr tablet Take 1 tablet (30 mg total) by mouth daily. 30 tablet 3  . potassium chloride SA (K-DUR,KLOR-CON) 20 MEQ tablet Take 2 tablets (40 mEq total) by mouth daily. 30 tablet 0  . vitamin A 8000 UNIT capsule Take 8,000 Units by mouth daily.    . Zinc 50 MG TABS Take 50 mg by mouth daily.      Home: Home Living Family/patient expects to be discharged to:: Private residence Living Arrangements: Spouse/significant other Available Help at Discharge: Family, Available 24 hours/day Type of Home: Apartment Home Access: Level entry Home Layout: One level Bathroom Shower/Tub: Engineer, manufacturing systemsTub/shower unit Bathroom Toilet: Standard Home Equipment: Bedside commode, Environmental consultantWalker - 2 wheels, Wheelchair - manual, Wheelchair - power, The ServiceMaster CompanyCane - quad, Information systems managerhower seat Additional Comments: Pt has 5, 3, and 2y.o. children   Functional History: Prior Function Level of Independence: Needs assistance Gait / Transfers Assistance Needed: Walks short distances in home, uses wheelchair  typically. ADL's / Homemaking Assistance Needed: Husband fully bathes pt and assists with bathing as well as toileting.  Sister also assists with care as well as child care, grocery shopping, etc.  Comments: patient has 3 young children Functional Status:  Mobility: Bed Mobility Overal bed mobility: Needs Assistance Bed Mobility: Supine to Sit, Sit to Supine Supine to sit: Max assist, +2 for physical assistance Sit to supine: Max assist, +2 for physical assistance General bed mobility comments: Patient able to use bilateral UEs to assist with pulling and proping, 2 person physical assist for movement of LEs and trunk to EOB and to return to bed Transfers General transfer comment: unable to perform this day      ADL:    Cognition: Cognition Overall Cognitive Status: Within Functional Limits for tasks assessed Orientation Level: Oriented X4 Cognition Arousal/Alertness: Awake/alert Behavior During Therapy: WFL for tasks assessed/performed Overall Cognitive Status: Within Functional Limits for tasks assessed  Blood pressure 129/68, pulse 74, temperature 98.4  F (36.9 C), temperature source Oral, resp. rate 18, height 5\' 5"  (1.651 m), weight (!) 214.6 kg (473 lb), SpO2 97 %. Physical Exam  Nursing note and vitals reviewed. Constitutional: She is oriented to person, place, and time. She appears well-developed and well-nourished.  Morbidly obese Anasarca noted with pitting edema on abdominal wall, 1-2+ pitting edema bilateral forearms and hands (R>L) as well as BLE.   HENT:  Head: Normocephalic and atraumatic.  Mouth/Throat: Oropharynx is clear and moist.  Eyes: Conjunctivae are normal. Pupils are equal, round, and reactive to light.  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 Neck supple.  Cardiovascular: Normal rate and regular rhythm.   Respiratory: Effort normal and breath sounds normal. No stridor. She has no wheezes. She exhibits no  tenderness.  Distant sounds  GI: Soft. Bowel sounds are normal. She exhibits no distension. There is no tenderness.  Musculoskeletal: She exhibits edema.  Neurological: She is alert and oriented to person, place, and time.  Sensation intact to light touch Motor: B/l UE: 4/5 proximal to distal (?right slightly weaker, but difficult to assess due to edema) B/l LE: hip flexion 3/5, knee extension 4/5, ankle dorsi/plantar flexion 4/5 (?right slightly weaker, but difficult to assess due to edema)  Skin: Skin is warm and dry.  Vascular changes B/l LE.   LLE dressing c/d/i Lymphedema  Psychiatric: Her speech is normal. Her affect is blunt. She is withdrawn.  Altered affect and interaction with husband    Results for orders placed or performed during the hospital encounter of 12/07/15 (from the past 24 hour(s))  Glucose, capillary     Status: Abnormal   Collection Time: 12/10/15  4:36 PM  Result Value Ref Range   Glucose-Capillary 134 (H) 65 - 99 mg/dL   Comment 1 Notify RN   Glucose, capillary     Status: Abnormal   Collection Time: 12/10/15 10:50 PM  Result Value Ref Range   Glucose-Capillary 135 (H) 65 - 99 mg/dL  Glucose, capillary     Status: Abnormal   Collection Time: 12/11/15  5:46 AM  Result Value Ref Range   Glucose-Capillary 133 (H) 65 - 99 mg/dL  Basic metabolic panel     Status: Abnormal   Collection Time: 12/11/15  9:14 AM  Result Value Ref Range   Sodium 138 135 - 145 mmol/L   Potassium 4.0 3.5 - 5.1 mmol/L   Chloride 112 (H) 101 - 111 mmol/L   CO2 18 (L) 22 - 32 mmol/L   Glucose, Bld 115 (H) 65 - 99 mg/dL   BUN 60 (H) 6 - 20 mg/dL   Creatinine, Ser 5.285.34 (H) 0.44 - 1.00 mg/dL   Calcium 8.4 (L) 8.9 - 10.3 mg/dL   GFR calc non Af Amer 9 (L) >60 mL/min   GFR calc Af Amer 11 (L) >60 mL/min   Anion gap 8 5 - 15  CBC with Differential/Platelet     Status: Abnormal   Collection Time: 12/11/15  9:14 AM  Result Value Ref Range   WBC 11.0 (H) 4.0 - 10.5 K/uL   RBC 2.80  (L) 3.87 - 5.11 MIL/uL   Hemoglobin 7.8 (L) 12.0 - 15.0 g/dL   HCT 41.324.4 (L) 24.436.0 - 01.046.0 %   MCV 87.1 78.0 - 100.0 fL   MCH 27.9 26.0 - 34.0 pg   MCHC 32.0 30.0 - 36.0 g/dL   RDW 27.215.6 (H) 53.611.5 - 64.415.5 %   Platelets 412 (H) 150 -  400 K/uL   Neutrophils Relative % 73 %   Neutro Abs 8.0 (H) 1.7 - 7.7 K/uL   Lymphocytes Relative 16 %   Lymphs Abs 1.8 0.7 - 4.0 K/uL   Monocytes Relative 8 %   Monocytes Absolute 0.8 0.1 - 1.0 K/uL   Eosinophils Relative 3 %   Eosinophils Absolute 0.3 0.0 - 0.7 K/uL   Basophils Relative 0 %   Basophils Absolute 0.0 0.0 - 0.1 K/uL  Glucose, capillary     Status: Abnormal   Collection Time: 12/11/15 11:25 AM  Result Value Ref Range   Glucose-Capillary 119 (H) 65 - 99 mg/dL   No results found.  Assessment/Plan: Diagnosis: Debility Labs and images independently reviewed.  Records reviewed and summated above.  1. Does the need for close, 24 hr/day medical supervision in concert with the patient's rehab needs make it unreasonable for this patient to be served in a less intensive setting? Yes  Co-Morbidities requiring supervision/potential complications:  history of CVA with RLE weakness, AKI on CKD stage 4 (avoid nephro toxic meds, recs per Nephro), super super obesity (Body mass index is 64.4 kg/m., diet and exercise education, encourage weight loss to increase endurance and promote overall health), HTN (monitor and provide prns in accordance with increased physical exertion and pain), chronic right calf ulcer (cont dressing changed), anemia of chronic disease (cont recs per nephro, transfuse if necessary to ensure appropriate perfusion for increased activity tolerance), lymphedema (cont compression with elevation), DM (Monitor in accordance with exercise and adjust meds as necessary), leukocytosis (cont to monitor for signs and symptoms of infection, further workup if indicated), Acute on chronic diastolic CHF (Monitor in accordance with increased physical  activity, monitor for signs/symptoms of fluid overload), asthma (monitor O2 Sats and RR with increased activity) 2. Due to bladder management, bowel management, safety, skin/wound care, disease management, medication administration, pain management and patient education, does the patient require 24 hr/day rehab nursing? Yes 3. Does the patient require coordinated care of a physician, rehab nurse, PT (1-2 hrs/day, 5 days/week) and OT (1-2 hrs/day, 5 days/week) to address physical and functional deficits in the context of the above medical diagnosis(es)? Yes Addressing deficits in the following areas: balance, endurance, locomotion, strength, transferring, bowel/bladder control, bathing, dressing, toileting and psychosocial support 4. Can the patient actively participate in an intensive therapy program of at least 3 hrs of therapy per day at least 5 days per week? Potentially 5. The potential for patient to make measurable gains while on inpatient rehab is excellent 6. Anticipated functional outcomes upon discharge from inpatient rehab are mod assist  with PT, min assist and mod assist with OT, n/a with SLP. 7. Estimated rehab length of stay to reach the above functional goals is: 16-19 days. 8. Does the patient have adequate social supports and living environment to accommodate these discharge functional goals? Potentially 9. Anticipated D/C setting: Home 10. Anticipated post D/C treatments: HH therapy and Home excercise program 11. Overall Rehab/Functional Prognosis: good and fair  RECOMMENDATIONS: This patient's condition is appropriate for continued rehabilitative care in the following setting: Pt requiring significant assistance and relatively sedentary at home.  Will inquire further about baseline level of function.  If pt not at baseline and significant caregiver support available at discharge, would consider CIR after medically stable.   Patient has agreed to participate in recommended program.  Yes Note that insurance prior authorization may be required for reimbursement for recommended care.  Comment: Rehab Admissions Coordinator to follow up.  Maryla Morrow, MD, [place] [facility] 12/11/2015

## 2015-12-11 NOTE — Progress Notes (Signed)
Patient ID: [name], female   DOB: 09/07/1975, 40 y.o.   MRN: [id number]  [facility] Progress Note   Assessment/ Plan:   1. 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 Renal function remains unchanged overnight-estimated GFR appears to be low but I suspect her superobesity contributes to underestimate of GFR. Unfortunately, it'll be logistically very difficult to try and get her into an outpatient hemodialysis center-fortunately, no compelling indications for dialysis at this time. She remains volume overloaded but appears to be diuretic responsive at this time-we'll try and optimize this further. 2. Exacerbation of diastolic heart failure: Ongoing efforts at optimization of diuretic therapy in order to try and alleviate features of volume overload. Will continue current dose of furosemide and add metolazone and monitor response for the next 24 hours. 3. Anemia: Consistent with anemia of chronic disease, status post initiation of Aranesp therapy. Iron stores appear replete.  4. Hypertension: Blood pressures appear to be improving in control on the current regimen of amlodipine, carvedilol and isosorbide. 5. Proximal ulcer of the right leg-stage II lesion: Ongoing local wound care in this nonambulatory/bedbound patient.  Subjective:   Reports to be feeling "claustrophobic" and frustrated at being in bed all day-"I need to get around"    Objective:   BP 127/62   Pulse 73   Temp 98.1 F (36.7 C) (Oral)   Resp 18   Ht 5\' 5"  (1.651 m)   Wt (!) 214.6 kg (473 lb)   SpO2 100%   BMI 78.71 kg/m   Intake/Output Summary (Last 24 hours) at 12/11/15 0744 Last data filed at 12/11/15 0600  Gross per 24 hour  Intake              770 ml  Output             1450 ml  Net             -680 ml   Weight change:   Physical Exam: WUJ:WJXBJYNGen:Appears to be uncomfortable resting in  bed, watching television CVS: Pulse regular rhythm, S1 and S2 normal without any gallop Resp: Distant breath sounds bilaterally-anterior chest Abd: Soft, obese, nontender Ext: 2+ left lower extremity edema, 1-2+ right lower extremity edema with clean, loose dressing.  Imaging: No results found.  Labs: BMET  Recent Labs Lab 12/07/15 1119 12/07/15 1615 12/07/15 2132 12/08/15 0250 12/09/15 0221 12/10/15 0721  NA 137  --   --  137 138 137  K 4.6  --   --  4.5 4.3 4.1  CL 110  --   --  112* 112* 113*  CO2 17*  --   --  17* 17* 16*  GLUCOSE 130*  --   --  134* 127* 115*  BUN 49*  --   --  52* 56* 59*  CREATININE 5.49*  --  5.56* 5.36* 5.37* 5.30*  CALCIUM 8.6*  --   --  8.7* 8.4* 8.3*  PHOS  --  4.6  --  4.9*  --   --    CBC  Recent Labs Lab 12/07/15 2132 12/08/15 0250 12/09/15 0221 12/10/15 0721  WBC 14.2* 14.4* 10.2 9.1  NEUTROABS  --   --  7.9* 6.7  HGB 7.7* 7.9* 6.8* 7.5*  HCT 24.0* 24.5* 20.8* 23.0*  MCV 84.2 84.8 83.5 83.0  PLT 317 314 302 328    Medications:    . amLODipine  10 mg  Oral Daily  . carvedilol  25 mg Oral BID WC  . darbepoetin (ARANESP) injection - NON-DIALYSIS  60 mcg Subcutaneous Q Sat-1800  . furosemide  160 mg Intravenous TID  . heparin  5,000 Units Subcutaneous Q8H  . insulin aspart  0-20 Units Subcutaneous TID WC  . isosorbide mononitrate  30 mg Oral Daily  . pantoprazole  40 mg Oral Daily   Linda BillsJay Camella Seim, MD 12/11/2015, 7:44 AM

## 2015-12-11 NOTE — Evaluation (Signed)
Physical Therapy Evaluation Patient Details Name: Linda DykesDavenia McKinnon [name] MRN: [id number] DOB: 07/31/1975 Today's Date: 12/11/2015   History of Present Illness  40 yo female with ckd stage IV, presents with generalized weakness, diarrhea and decreased appetite, over 7 days. Presents with worsening volume overload.  Clinical Impression  Patient demonstrates deficits in functional mobility as indicated below. Will need continued skilled PT to address deficits and maximize function. Will see as indicated and progress as tolerated. OF NOTE: prior to recent illness, patient was able to mobilize short distance and perform bed mobility and transfers. At this time, patient very limited and weak. May benefit from short CIR stay to increase independence and decreased burden of care. Patient is a young mother of 3 children and is very eager to mobilize.    Follow Up Recommendations CIR    Equipment Recommendations  Other (comment) (TBD)    Recommendations for Other Services Rehab consult     Precautions / Restrictions Precautions Precautions: Fall Restrictions Weight Bearing Restrictions: No      Mobility  Bed Mobility Overal bed mobility: Needs Assistance Bed Mobility: Supine to Sit;Sit to Supine     Supine to sit: Max assist;+2 for physical assistance Sit to supine: Max assist;+2 for physical assistance   General bed mobility comments: Patient able to use bilateral UEs to assist with pulling and proping, 2 person physical assist for movement of LEs and trunk to EOB and to return to bed  Transfers                 General transfer comment: unable to perform this day  Ambulation/Gait                Stairs            Wheelchair Mobility    Modified Rankin (Stroke Patients Only)       Balance Overall balance assessment: Needs assistance Sitting-balance support: Feet supported Sitting balance-Leahy Scale: Fair Sitting balance - Comments: able to tolerate  dangling EOB >15 minutes                                     Pertinent Vitals/Pain Pain Assessment: No/denies pain    Home Living Family/patient expects to be discharged to:: Private residence Living Arrangements: Spouse/significant other Available Help at Discharge: Family;Available 24 hours/day Type of Home: Apartment Home Access: Level entry     Home Layout: One level Home Equipment: Bedside commode;Walker - 2 wheels;Wheelchair - Engineer, technical salesmanual;Wheelchair - power;Cane - quad;Shower seat Additional Comments: Pt has 5, 3, and 2y.o. children     Prior Function Level of Independence: Needs assistance   Gait / Transfers Assistance Needed: Walks short distances in home, uses wheelchair typically.  ADL's / Homemaking Assistance Needed: Husband fully bathes pt and assists with bathing as well as toileting.  Sister also assists with care as well as child care, grocery shopping, etc.   Comments: patient has 3 young children     Hand Dominance   Dominant Hand: Right    Extremity/Trunk Assessment   Upper Extremity Assessment: Generalized weakness (increased body habitus)           Lower Extremity Assessment: RLE deficits/detail;LLE deficits/detail;Generalized weakness RLE Deficits / Details: bandaged due to LE wound (increased boday habitus noted) LLE Deficits / Details:  (increased body habitus and edema)     Communication   Communication: No difficulties  Cognition Arousal/Alertness: Awake/alert Behavior During Therapy:  WFL for tasks assessed/performed Overall Cognitive Status: Within Functional Limits for tasks assessed                      General Comments      Exercises     Assessment/Plan    PT Assessment Patient needs continued PT services  PT Problem List Decreased strength;Decreased activity tolerance;Decreased balance;Decreased mobility;Decreased coordination;Cardiopulmonary status limiting activity;Obesity          PT Treatment  Interventions DME instruction;Gait training;Functional mobility training;Therapeutic activities;Therapeutic exercise;Balance training;Patient/family education    PT Goals (Current goals can be found in the Care Plan section)  Acute Rehab PT Goals Patient Stated Goal: to be able to mobilize PT Goal Formulation: With patient/family Time For Goal Achievement: 12/25/15 Potential to Achieve Goals: Good    Frequency Min 3X/week   Barriers to discharge        Co-evaluation               End of Session   Activity Tolerance: Patient tolerated treatment well Patient left: in bed;with call bell/phone within reach;with family/visitor present Nurse Communication: Mobility status         Time: [id number] PT Time Calculation (min) (ACUTE ONLY): 25 min   Charges:   PT Evaluation $PT Eval Moderate Complexity: 1 Procedure PT Treatments $Therapeutic Activity: 8-22 mins   PT G CodesFabio Asa:        Asaph Serena J 12/11/2015, 1:31 PM Charlotte Crumbevon Sophiagrace Benbrook, PT DPT  [phone]

## 2015-12-12 ENCOUNTER — Other Ambulatory Visit: Payer: Self-pay

## 2015-12-12 DIAGNOSIS — I5023 Acute on chronic systolic (congestive) heart failure: Secondary | ICD-10-CM

## 2015-12-12 DIAGNOSIS — I6930 Unspecified sequelae of cerebral infarction: Secondary | ICD-10-CM

## 2015-12-12 DIAGNOSIS — I5033 Acute on chronic diastolic (congestive) heart failure: Secondary | ICD-10-CM

## 2015-12-12 DIAGNOSIS — R0600 Dyspnea, unspecified: Secondary | ICD-10-CM

## 2015-12-12 DIAGNOSIS — Z794 Long term (current) use of insulin: Secondary | ICD-10-CM

## 2015-12-12 DIAGNOSIS — J45909 Unspecified asthma, uncomplicated: Secondary | ICD-10-CM

## 2015-12-12 DIAGNOSIS — I10 Essential (primary) hypertension: Secondary | ICD-10-CM

## 2015-12-12 DIAGNOSIS — E118 Type 2 diabetes mellitus with unspecified complications: Secondary | ICD-10-CM

## 2015-12-12 DIAGNOSIS — R601 Generalized edema: Secondary | ICD-10-CM

## 2015-12-12 DIAGNOSIS — I890 Lymphedema, not elsewhere classified: Secondary | ICD-10-CM

## 2015-12-12 DIAGNOSIS — I872 Venous insufficiency (chronic) (peripheral): Secondary | ICD-10-CM

## 2015-12-12 DIAGNOSIS — L97219 Non-pressure chronic ulcer of right calf with unspecified severity: Secondary | ICD-10-CM

## 2015-12-12 DIAGNOSIS — N189 Chronic kidney disease, unspecified: Secondary | ICD-10-CM

## 2015-12-12 DIAGNOSIS — N179 Acute kidney failure, unspecified: Secondary | ICD-10-CM

## 2015-12-12 DIAGNOSIS — D72820 Lymphocytosis (symptomatic): Secondary | ICD-10-CM

## 2015-12-12 DIAGNOSIS — D638 Anemia in other chronic diseases classified elsewhere: Secondary | ICD-10-CM

## 2015-12-12 DIAGNOSIS — IMO0002 Reserved for concepts with insufficient information to code with codable children: Secondary | ICD-10-CM

## 2015-12-12 DIAGNOSIS — E1165 Type 2 diabetes mellitus with hyperglycemia: Secondary | ICD-10-CM

## 2015-12-12 DIAGNOSIS — R5381 Other malaise: Secondary | ICD-10-CM

## 2015-12-12 LAB — BASIC METABOLIC PANEL
Anion gap: 7 (ref 5–15)
BUN: 61 mg/dL — ABNORMAL HIGH (ref 6–20)
CALCIUM: 8.1 mg/dL — AB (ref 8.9–10.3)
CO2: 18 mmol/L — AB (ref 22–32)
CREATININE: 5.36 mg/dL — AB (ref 0.44–1.00)
Chloride: 113 mmol/L — ABNORMAL HIGH (ref 101–111)
GFR calc non Af Amer: 9 mL/min — ABNORMAL LOW (ref >60)
GFR, EST AFRICAN AMERICAN: 11 mL/min — AB (ref >60)
Glucose, Bld: 120 mg/dL — ABNORMAL HIGH (ref 65–99)
Potassium: 3.9 mmol/L (ref 3.5–5.1)
SODIUM: 138 mmol/L (ref 135–145)

## 2015-12-12 LAB — GLUCOSE, CAPILLARY
GLUCOSE-CAPILLARY: 103 mg/dL — AB (ref 65–99)
GLUCOSE-CAPILLARY: 123 mg/dL — AB (ref 65–99)
GLUCOSE-CAPILLARY: 137 mg/dL — AB (ref 65–99)
Glucose-Capillary: 103 mg/dL — ABNORMAL HIGH (ref 65–99)

## 2015-12-12 MED ORDER — NITROGLYCERIN 0.4 MG SL SUBL
SUBLINGUAL_TABLET | SUBLINGUAL | Status: AC
  Filled 2015-12-12: qty 1

## 2015-12-12 MED ORDER — GI COCKTAIL ~~LOC~~
30.0000 mL | Freq: Once | ORAL | Status: AC
Start: 2015-12-12 — End: 2015-12-12
  Administered 2015-12-12: 30 mL via ORAL
  Filled 2015-12-12: qty 30

## 2015-12-12 MED ORDER — TORSEMIDE 20 MG PO TABS
60.0000 mg | ORAL_TABLET | Freq: Two times a day (BID) | ORAL | Status: DC
  Administered 2015-12-12 – 2015-12-15 (×6): 60 mg via ORAL
  Filled 2015-12-12 (×6): qty 3

## 2015-12-12 MED ORDER — METOLAZONE 5 MG PO TABS
5.0000 mg | ORAL_TABLET | Freq: Two times a day (BID) | ORAL | Status: AC
  Administered 2015-12-12 – 2015-12-13 (×2): 5 mg via ORAL
  Filled 2015-12-12 (×2): qty 1

## 2015-12-12 MED ORDER — METOLAZONE 5 MG PO TABS: 5.0000 mg | ORAL_TABLET | Freq: Two times a day (BID) | ORAL | Status: DC

## 2015-12-12 MED ORDER — NITROGLYCERIN 0.4 MG SL SUBL
0.4000 mg | SUBLINGUAL_TABLET | SUBLINGUAL | Status: DC | PRN
  Administered 2015-12-12: 0.4 mg via SUBLINGUAL

## 2015-12-12 NOTE — Progress Notes (Signed)
Heart Failure Navigator Consult Note  Presentation: [name] is a 40 y.o. female past medical history significant for asthma, congestive heart failure, CVA, hypertension, diabetes and morbid obesity. Her chief complaint of weakness and decreased appetite. Per patient has had decreased appetite and weakness that is worse over the last 7 days. Patient also has had upper respiratory tract infection symptoms. They have not sought any medical care for this patient has had a fever at home up to 103 taken orally. What brought patient Into the ED is the patient got so weak she couldn't even transfer out of a chair.  Past Medical History:  Diagnosis Date  . Asthma   . CHF (congestive heart failure) (HCC)    diastolic  . CVA (cerebral infarction) 1990's   "writing is not the same since" R leg weakness   . GERD (gastroesophageal reflux disease)   . Hypertension   . Morbid obesity (HCC)   . Stroke (HCC)   . Type II diabetes mellitus (HCC)     Social History   Social History  . Marital status: Married    Spouse name: N/A  . Number of children: N/A  . Years of education: N/A   Social History Main Topics  . Smoking status: Never Smoker  . Smokeless tobacco: Never Used  . Alcohol use No  . Drug use: No  . Sexual activity: Not Asked   Other Topics Concern  . None   Social History Narrative  . None    ECHO:Study Conclusions--02/05/15  - Left ventricle: The cavity size was normal. Wall thickness was   increased in a pattern of moderate LVH. Systolic function was   normal. The estimated ejection fraction was in the range of 55%   to 60%. Wall motion was normal; there were no regional wall   motion abnormalities. Doppler parameters are consistent with   abnormal left ventricular relaxation (grade 1 diastolic   dysfunction). - Aortic valve: Overall images are low quality but on the short   axis view (image 21) I cannot exclude a small mobile density   associated with the  right coronary cusp of the aortic valve. This   is not seen in other views. Consider TEE if clinically indicated. - Mitral valve: There was mild regurgitation. - Left atrium: The atrium was mildly dilated.  Transthoracic echocardiography.  M-mode, complete 2D, spectral Doppler, and color Doppler.  Birthdate:  Patient birthdate: 03-11-1975.  Age:  Patient is 40 yr old.  Sex:  Gender: female. BMI: 70.5 kg/m^2.  Blood pressure:     182/102  Patient status: Inpatient.  Study date:  Study date: 02/05/2015. Study time: 02:33 PM.  Location:  ICU/CCU  BNP    Component Value Date/Time   BNP 375.4 (H) 12/07/2015 1119    ProBNP No results found for: PROBNP   Education Assessment and Provision:  Detailed education and instructions provided on heart failure disease management including the following:  Signs and symptoms of Heart Failure When to call the physician Importance of daily weights Low sodium diet Fluid restriction Medication management Anticipated future follow-up appointments  Patient education given on each of the above topics.  Patient acknowledges understanding and acceptance of all instructions.  I spoke with [name] regarding her current hospitalization and HF diagnosis.  She tells me that she has received HF recommendations for home before.  She does not have a scale and I will provide her one that will support her weight today (386.1 lbs)--yet not her admission  weight (484lbs).  I stressed the importance of daily weights and when to contact the physician related to weight increases.  Her husband does say that she needs assistance when out of bed and is unsteady on her feet.  I reviewed a low sodium diet and high sodium foods to avoid.  Her husband was telling me that she often eats "potato chips and tortilla chips with dip".  He also says she drink 2 L drinks often.  I discouraged these foods and really encouraged them to limit her sodium to less than 2000 mg per day.   She does not currently have any insurance --yet has applied and is reaching out to Medicaid.  I will discuss with CM a follow-up plan after discharge- she may be appropriate for the [facility] if she can get transportation.   Education Materials:  "Living Better With Heart Failure" Booklet, Daily Weight Tracker Tool    High Risk Criteria for Readmission and/or Poor Patient Outcomes:   EF <30%- 55-60% with grade 1 dias dys  2 or more admissions in 6 months- no- 1/8017mo  Difficult social situation- Yes -no insurance noted  Demonstrates medication noncompliance- yes admits that she cannot afford  Barriers of Care:  Finances, knowledge and compliance  Discharge Planning:   Plans to return home with husband and children (ages 283, 584, 6).  Her mother is available as needed for support as well.

## 2015-12-12 NOTE — Progress Notes (Signed)
Patient with complaints of being uncomfortable in the bed off and on for most of the 7 pm-7am shift, no real pain just doesn't like bed and can't get comfortable.  Nursing staff in room multiple times to reposition patient, perform foley care, pull patient up in bed, etc.  Patient with very large incontinent BM in early morning hours.  Patient asked for grape juice then immediately complained that it gave her heartburn, called on call provider who ordered a GI cocktail which was given to patient.  Patient resting comfortably in bed when RN checked her at 0700.

## 2015-12-12 NOTE — Progress Notes (Signed)
PROGRESS NOTE    Linda DykesDavenia McKinnon [name]  JYN:[id number] DOB: 05/12/1975 DOA: 12/07/2015 PCP: Dorrene GermanEdwin A Avbuere, MD   Brief Narrative:  40 yo female with ckd stage IV, presents with generalized weakness, diarrhea and decreased appetite, over 7 days. On exam noted severe edema on the lower extremities. Worsening renal function to cr up to 5. On high dose of furosemide, urine output improved. No IV access, changed to torsemide, continue diuresis.   Assessment & Plan:   Principal Problem:   Dyspnea Active Problems:   Thigh abscess   AKI (acute kidney injury) (HCC)   Anasarca   Asthma   Essential hypertension   CHF (congestive heart failure) (HCC)  1. AKI on CKD stage 4. Urine output 2800 cc over last 24 hours, renal function with cr stable at 5.36, K at 3,9and serum bicarbonate at 18, with persistent non anion gap metabolic acidosis. Difficult IV access, will continue torsemide 60 mg bid and metolazone 5 mg bid. Follow nephrology recommendations.    2. HTN.Stable systolic 150 to 160, will continue amlodipine, coreg and isosorbide. Aggressive diuresis.  3. Asthma. No signs of exacerbations will continue Bronchodilator therapy with duonebs.  4. T2DM. Glucose cover and monitoring. Capillary glucose 110-103-123. Patient tolerating po well.   5. Morbid obesity. dvt px.   6. Right leg, proximal, ulcerated lesion stage 2. Continue local wound care.  Patient is not ambulatory, bed bound. Physical therapy evaluation, possible inpatient rehab.   7. Anemia of chronic disease. Hb has been stable 7.8. Will follow cell count. Continue aranesp. Transferrin saturation 23 with ferritin 943.   8. Dyspepsia and reflux. Tolerating po well, continue antiacid therapy.     Subjective: Patient feeling better, dyspnea improved, persistent edema lower extremities,. Improved chest pain, no nausea or vomiting.   Objective: Vitals:   12/11/15 0940 12/11/15 1128 12/11/15 2149 12/12/15 0458  BP:   129/68 132/71 137/70  Pulse:  74 74 81  Resp:  18 18 18   Temp:  98.4 F (36.9 C) 98.4 F (36.9 C) 98.6 F (37 C)  TempSrc:  Oral Oral Oral  SpO2: 100% 97% 98% 98%  Weight:    (!) 175.5 kg (387 lb)  Height:        Intake/Output Summary (Last 24 hours) at 12/12/15 1042 Last data filed at 12/12/15 0527  Gross per 24 hour  Intake                0 ml  Output             2100 ml  Net            -2100 ml   Filed Weights   12/09/15 0538 12/10/15 0654 12/12/15 0458  Weight: (!) 214.6 kg (473 lb) (!) 214.6 kg (473 lb) (!) 175.5 kg (387 lb)    Examination:  General exam: Not in pain, deconditioned. E ENT: mild conjunctival pallor, oral mucosa moist. Respiratory system: Mild decreased breath sounds at the dependent zones, no wheezing, rales or rhonchi.   Cardiovascular system: S1 & S2 heard, RRR. No JVD, murmurs, rubs, gallops or clicks. pedal edema ++++ pitting. Gastrointestinal system: Abdomen is nondistended, soft and nontender. No organomegaly or masses felt. Normal bowel sounds heard. Central nervous system: Alert and oriented. No focal neurological deficits. Extremities: Symmetric 5 x 5 power. Skin: No rashes, lesions or ulcers      Data Reviewed: I have personally reviewed following labs and imaging studies  CBC:  Recent Labs Lab 12/07/15 2132 12/08/15  [id number] 12/09/15 0221 12/10/15 0721 12/11/15 0914  WBC 14.2* 14.4* 10.2 9.1 11.0*  NEUTROABS  --   --  7.9* 6.7 8.0*  HGB 7.7* 7.9* 6.8* 7.5* 7.8*  HCT 24.0* 24.5* 20.8* 23.0* 24.4*  MCV 84.2 84.8 83.5 83.0 87.1  PLT 317 314 302 328 412*   Basic Metabolic Panel:  Recent Labs Lab 12/07/15 1119 12/07/15 1615 12/07/15 2132 12/08/15 0250 12/09/15 0221 12/10/15 0721 12/11/15 0914  NA 137  --   --  137 138 137 138  K 4.6  --   --  4.5 4.3 4.1 4.0  CL 110  --   --  112* 112* 113* 112*  CO2 17*  --   --  17* 17* 16* 18*  GLUCOSE 130*  --   --  134* 127* 115* 115*  BUN 49*  --   --  52* 56* 59* 60*  CREATININE  5.49*  --  5.56* 5.36* 5.37* 5.30* 5.34*  CALCIUM 8.6*  --   --  8.7* 8.4* 8.3* 8.4*  MG  --  2.0  --   --   --   --   --   PHOS  --  4.6  --  4.9*  --   --   --    GFR: Estimated Creatinine Clearance: 23.3 mL/min (by C-G formula based on SCr of 5.34 mg/dL (H)). Liver Function Tests:  Recent Labs Lab 12/08/15 0250  ALBUMIN 2.2*   No results for input(s): LIPASE, AMYLASE in the last 168 hours. No results for input(s): AMMONIA in the last 168 hours. Coagulation Profile: No results for input(s): INR, PROTIME in the last 168 hours. Cardiac Enzymes:  Recent Labs Lab 12/07/15 1615 12/07/15 2132 12/08/15 0250  TROPONINI 0.04* 0.19* 0.09*   BNP (last 3 results) No results for input(s): PROBNP in the last 8760 hours. HbA1C: No results for input(s): HGBA1C in the last 72 hours. CBG:  Recent Labs Lab 12/11/15 0546 12/11/15 1125 12/11/15 1637 12/11/15 2147 12/12/15 0622  GLUCAP 133* 119* 119* 110* 103*   Lipid Profile: No results for input(s): CHOL, HDL, LDLCALC, TRIG, CHOLHDL, LDLDIRECT in the last 72 hours. Thyroid Function Tests: No results for input(s): TSH, T4TOTAL, FREET4, T3FREE, THYROIDAB in the last 72 hours. Anemia Panel:  Recent Labs  12/09/15 1522  FERRITIN 943*  TIBC 125*  IRON 29   Sepsis Labs:  Recent Labs Lab 12/07/15 1126 12/07/15 1620 12/07/15 2132  LATICACIDVEN 0.73 1.0 0.9    No results found for this or any previous visit (from the past 240 hour(s)).       Radiology Studies: No results found.      Scheduled Meds: . amLODipine  10 mg Oral Daily  . carvedilol  25 mg Oral BID WC  . darbepoetin (ARANESP) injection - NON-DIALYSIS  60 mcg Subcutaneous Q Sat-1800  . heparin  5,000 Units Subcutaneous Q8H  . insulin aspart  0-20 Units Subcutaneous TID WC  . isosorbide mononitrate  30 mg Oral Daily  . metolazone  5 mg Oral BID  . pantoprazole  40 mg Oral Daily  . torsemide  60 mg Oral BID   Continuous Infusions:    LOS: 4 days          Gaynel Schaafsma Annett Gulaaniel Jacqulynn Shappell, MD [facility] Pager [phone]  If 7PM-7AM, please contact night-coverage [email or web address] Password [email or web address] 12/12/2015, 10:42 AM

## 2015-12-12 NOTE — Progress Notes (Signed)
Spoke with MD regarding removal of foley catheter. MD wants foley left in place for accurate urine output. Will continue to monitor.

## 2015-12-12 NOTE — Progress Notes (Signed)
Inpatient Rehabilitation  I met with the patient at the bedside to discuss the possibility of an IP Rehab admission.  I spoke by phone with pt's sister, Brent Bulla ([phone]) at the patient's request.  Altha Harm confirms that she is pt's primary caregiver and that she can provide necessary assistance level if pt. comes to IP rehab.  Per pt., she had been an indoor ambulator until very recently.  I will follow along for medical progression, tolerance and bed availability for a possible IP rehab admission.  Please call if questions.  [facility] Admissions Coordinator Cell [phone] Office [phone]

## 2015-12-12 NOTE — Progress Notes (Signed)
Patient ID: [name], female   DOB: 06-02-1975, 40 y.o.   MRN: [id number]  [facility] Progress Note   Assessment/ Plan:   1. 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 EGFR likely to be an underestimate with her degree of morbid obesity. Continue efforts at diuretic therapy given persistence of volume overload- will increase the dose of oral torsemide and continued short-term metolazone. 2. Exacerbation of diastolic heart failure: Ongoing efforts at optimization of diuretic therapy in order to try and alleviate features of volume overload. Symptomatically appears to be improving slowly. Her main complaint is that of immobility and frustration and poor ability with ambulation. 3. Anemia: Consistent with anemia of chronic disease, status post initiation of Aranesp therapy. Iron stores appear replete.  4. Hypertension: Blood pressures appear to be improving in control on the current regimen of amlodipine, carvedilol and isosorbide. 5. Proximal ulcer of the right leg-stage II lesion: Ongoing local wound care in this nonambulatory/bedbound patient.  Subjective:   Ongoing evaluation for admission to CIR-she is apprehensive about the duration of stay there as well as missing her appointments with wound care (we discussed ongoing goals of care here).    Objective:   BP 137/70 (BP Location: Left Wrist)   Pulse 81   Temp 98.6 F (37 C) (Oral)   Resp 18   Ht 5' 5" (1.651 m)   Wt (!) 175.5 kg (387 lb) Comment: bedscale  SpO2 98%   BMI 64.40 kg/m   Intake/Output Summary (Last 24 hours) at 12/12/15 0849 Last data filed at 12/12/15 8502  Gross per 24 hour  Intake                0 ml  Output             2100 ml  Net            -2100 ml   Weight change:   Physical Exam: DXA:JOINOMV to be  comfortable resting in bed, watching television CVS: Pulse regular rhythm, S1 and S2 normal without any gallop Resp: Distant breath sounds bilaterally-anterior chest Abd: Soft, obese, nontender Ext: 2+ left lower extremity edema, 1-2+ right lower extremity edema with clean, loose dressing.  Imaging: No results found.  Labs: BMET  Recent Labs Lab 12/07/15 1119 12/07/15 1615 12/07/15 2132 12/08/15 0250 12/09/15 0221 12/10/15 0721 12/11/15 0914  NA 137  --   --  137 138 137 138  K 4.6  --   --  4.5 4.3 4.1 4.0  CL 110  --   --  112* 112* 113* 112*  CO2 17*  --   --  17* 17* 16* 18*  GLUCOSE 130*  --   --  134* 127* 115* 115*  BUN 49*  --   --  52* 56* 59* 60*  CREATININE 5.49*  --  5.56* 5.36* 5.37* 5.30* 5.34*  CALCIUM 8.6*  --   --  8.7* 8.4* 8.3* 8.4*  PHOS  --  4.6  --  4.9*  --   --   --    CBC  Recent Labs Lab 12/08/15 0250 12/09/15 0221 12/10/15 0721 12/11/15 0914  WBC 14.4* 10.2 9.1 11.0*  NEUTROABS  --  7.9* 6.7 8.0*  HGB 7.9* 6.8* 7.5* 7.8*  HCT 24.5* 20.8* 23.0* 24.4*  MCV 84.8 83.5 83.0 87.1  PLT 314 302  328 412*    Medications:    . amLODipine  10 mg Oral Daily  . carvedilol  25 mg Oral BID WC  . darbepoetin (ARANESP) injection - NON-DIALYSIS  60 mcg Subcutaneous Q Sat-1800  . heparin  5,000 Units Subcutaneous Q8H  . insulin aspart  0-20 Units Subcutaneous TID WC  . isosorbide mononitrate  30 mg Oral Daily  . pantoprazole  40 mg Oral Daily  . torsemide  20 mg Oral BID   Elmarie Shiley, MD 12/12/2015, 8:49 AM

## 2015-12-13 LAB — CBC WITH DIFFERENTIAL/PLATELET
BASOS ABS: 0 10*3/uL (ref 0.0–0.1)
Basophils Relative: 0 %
Eosinophils Absolute: 0.4 10*3/uL (ref 0.0–0.7)
Eosinophils Relative: 2 %
HEMATOCRIT: 24.4 % — AB (ref 36.0–46.0)
HEMOGLOBIN: 7.8 g/dL — AB (ref 12.0–15.0)
LYMPHS PCT: 11 %
Lymphs Abs: 1.8 10*3/uL (ref 0.7–4.0)
MCH: 27.1 pg (ref 26.0–34.0)
MCHC: 32 g/dL (ref 30.0–36.0)
MCV: 84.7 fL (ref 78.0–100.0)
Monocytes Absolute: 0.9 10*3/uL (ref 0.1–1.0)
Monocytes Relative: 6 %
NEUTROS ABS: 12.4 10*3/uL — AB (ref 1.7–7.7)
NEUTROS PCT: 81 %
Platelets: 479 10*3/uL — ABNORMAL HIGH (ref 150–400)
RBC: 2.88 MIL/uL — AB (ref 3.87–5.11)
RDW: 14.8 % (ref 11.5–15.5)
WBC: 15.4 10*3/uL — ABNORMAL HIGH (ref 4.0–10.5)

## 2015-12-13 LAB — GLUCOSE, CAPILLARY
GLUCOSE-CAPILLARY: 127 mg/dL — AB (ref 65–99)
GLUCOSE-CAPILLARY: 125 mg/dL — AB (ref 65–99)
Glucose-Capillary: 117 mg/dL — ABNORMAL HIGH (ref 65–99)
Glucose-Capillary: 155 mg/dL — ABNORMAL HIGH (ref 65–99)

## 2015-12-13 LAB — BASIC METABOLIC PANEL
ANION GAP: 10 (ref 5–15)
BUN: 57 mg/dL — ABNORMAL HIGH (ref 6–20)
CO2: 18 mmol/L — ABNORMAL LOW (ref 22–32)
Calcium: 8.3 mg/dL — ABNORMAL LOW (ref 8.9–10.3)
Chloride: 111 mmol/L (ref 101–111)
Creatinine, Ser: 5.28 mg/dL — ABNORMAL HIGH (ref 0.44–1.00)
GFR, EST AFRICAN AMERICAN: 11 mL/min — AB (ref >60)
GFR, EST NON AFRICAN AMERICAN: 9 mL/min — AB (ref >60)
GLUCOSE: 123 mg/dL — AB (ref 65–99)
POTASSIUM: 3.7 mmol/L (ref 3.5–5.1)
Sodium: 139 mmol/L (ref 135–145)

## 2015-12-13 MED ORDER — TRAZODONE HCL 100 MG PO TABS: 100.0000 mg | ORAL_TABLET | Freq: Every evening | ORAL | Status: DC | PRN

## 2015-12-13 MED ORDER — CARVEDILOL 25 MG PO TABS
25.0000 mg | ORAL_TABLET | Freq: Two times a day (BID) | ORAL | Status: DC
  Administered 2015-12-13 – 2015-12-15 (×5): 25 mg via ORAL
  Filled 2015-12-13 (×5): qty 1

## 2015-12-13 NOTE — Progress Notes (Signed)
PROGRESS NOTE    Linda DykesDavenia McKinnon [name]  ZOX:[id number] DOB: 03/14/1975 DOA: 12/07/2015 PCP: Dorrene GermanEdwin A Avbuere, MD   Brief Narrative:  40 yo female with CKD stage IV, chronic diastolic CHF, uncontrolled DM, and morbid obesity presented 10/26 with generalized weakness, diarrhea and decreased appetite, found to be severely edematous and weak. Creatinine was elevated On high dose of furosemide, urine output improved. No IV access, changed to torsemide, continue diuresis.   Assessment & Plan:   Principal Problem:   Dyspnea Active Problems:   Thigh abscess   AKI (acute kidney injury) (HCC)   Anasarca   Asthma   Essential hypertension   CHF (congestive heart failure) (HCC)   Acute on chronic systolic congestive heart failure (HCC)   Venous stasis ulcer of right calf without varicose veins (HCC)   History of CVA with residual deficit   Super-super obese (HCC)   Acute kidney injury superimposed on chronic kidney disease (HCC)   Anemia of chronic disease   Lymphedema   Uncontrolled type 2 diabetes mellitus with complication, with long-term current use of insulin (HCC)   Lymphocytosis   Debility  Acute exacerbation of chronic diastolic CHF: 2016 echo with EF 55-60%, G1DD, mild-mod LVH. Symptomatically improving slowly.  - Continue po diuresis ~1.8L out /24hrs with creatinine stable ~5.3.  - Daily weights wildly inaccurate; continue strict I/O - No longer with IV diuresis, D/C foley.  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 Cr elevation somewhat expected given habitus. UOP has picked up with no appreciable change in creatinine. Persistent NAGMA without indications for HD. - Continue torsemide 60 mg bid and metolazone 5 mg bid.  - Follow nephrology recommendations.    HTN: Chronic, stable.  - Continue amlodipine, coreg and isosorbide. Aggressive diuresis.  Asthma: Chronic, stable. No exacerbation. Dyspnea likely additionally worsened by OHS.  -  Continue Bronchodilator therapy with duonebs.  T2DM: Last HbA1c 7.8% in Dec 2016. - CBG and SSI qAC/HS. At inpatient goal   Morbid obesity:  - Root cause of many conditions including deconditioning - DVT ppx - PT eval, recommending CIR. Not certain pt will fully participate at the required level.   Right leg, proximal, ulcerated lesion stage 2: Patient is not ambulatory, bed bound. - Continue local wound care.   Anemia of chronic disease. Hb has been stable 7.8. Transferrin saturation 23 with ferritin 943.  - Will follow cell count.  - Continue aranesp.   Dyspepsia and reflux.  - Tolerating po well, continue antiacid therapy.   Subjective: Patient feeling better, dyspnea improved, persistent edema lower extremities,. Improved chest pain, no nausea or vomiting.   Objective: Vitals:   12/12/15 1319 12/12/15 2054 12/13/15 0641 12/13/15 1258  BP: (!) 152/86 (!) 156/77 (!) 142/61 (!) 143/63  Pulse:  83 81 77  Resp:  20 18 20   Temp:  98.3 F (36.8 C) 99.1 F (37.3 C) 98.6 F (37 C)  TempSrc:  Oral Oral Oral  SpO2:  93% 99% 98%  Weight:   (!) 174.6 kg (385 lb)   Height:        Intake/Output Summary (Last 24 hours) at 12/13/15 1916 Last data filed at 12/13/15 1700  Gross per 24 hour  Intake              590 ml  Output             2925 ml  Net            -2335  ml   Filed Weights   12/10/15 0654 12/12/15 0458 12/13/15 0641  Weight: (!) 214.6 kg (473 lb) (!) 175.5 kg (387 lb) (!) 174.6 kg (385 lb)    Examination:  General exam: Morbidly obese 40 y.o. female in no distress E ENT: mild conjunctival pallor, oral mucosa moist. Respiratory system: Nonlabored. Globally distant breath sounds. Mild decreased breath sounds at the dependent zones, no wheezing, rales or rhonchi.   Cardiovascular system: S1 & S2 heard, RRR. No JVD, murmurs, rubs, gallops or clicks. pedal edema 3+ pitting. Gastrointestinal system: Abdomen is obese, nondistended, soft and nontender. No  organomegaly or masses felt. Normal bowel sounds heard. Central nervous system: Alert and oriented. No focal neurological deficits but diffusely deconditioned. Extremities: No fedormities Skin: No rashes, lesions or ulcers   Data Reviewed: I have personally reviewed following labs and imaging studies  CBC:  Recent Labs Lab 12/08/15 0250 12/09/15 0221 12/10/15 0721 12/11/15 0914 12/13/15 0323  WBC 14.4* 10.2 9.1 11.0* 15.4*  NEUTROABS  --  7.9* 6.7 8.0* 12.4*  HGB 7.9* 6.8* 7.5* 7.8* 7.8*  HCT 24.5* 20.8* 23.0* 24.4* 24.4*  MCV 84.8 83.5 83.0 87.1 84.7  PLT 314 302 328 412* 479*   Basic Metabolic Panel:  Recent Labs Lab 12/07/15 1615  12/08/15 0250 12/09/15 0221 12/10/15 0721 12/11/15 0914 12/12/15 1131 12/13/15 0323  NA  --   --  137 138 137 138 138 139  K  --   --  4.5 4.3 4.1 4.0 3.9 3.7  CL  --   --  112* 112* 113* 112* 113* 111  CO2  --   --  17* 17* 16* 18* 18* 18*  GLUCOSE  --   --  134* 127* 115* 115* 120* 123*  BUN  --   --  52* 56* 59* 60* 61* 57*  CREATININE  --   < > 5.36* 5.37* 5.30* 5.34* 5.36* 5.28*  CALCIUM  --   --  8.7* 8.4* 8.3* 8.4* 8.1* 8.3*  MG 2.0  --   --   --   --   --   --   --   PHOS 4.6  --  4.9*  --   --   --   --   --   < > = values in this interval not displayed. GFR: Estimated Creatinine Clearance: 23.5 mL/min (by C-G formula based on SCr of 5.28 mg/dL (H)). Liver Function Tests:  Recent Labs Lab 12/08/15 0250  ALBUMIN 2.2*   No results for input(s): LIPASE, AMYLASE in the last 168 hours. No results for input(s): AMMONIA in the last 168 hours. Coagulation Profile: No results for input(s): INR, PROTIME in the last 168 hours. Cardiac Enzymes:  Recent Labs Lab 12/07/15 1615 12/07/15 2132 12/08/15 0250  TROPONINI 0.04* 0.19* 0.09*   BNP (last 3 results) No results for input(s): PROBNP in the last 8760 hours. HbA1C: No results for input(s): HGBA1C in the last 72 hours. CBG:  Recent Labs Lab 12/12/15 1710  12/12/15 2051 12/13/15 0637 12/13/15 1151 12/13/15 1621  GLUCAP 103* 137* 117* 127* 155*   Lipid Profile: No results for input(s): CHOL, HDL, LDLCALC, TRIG, CHOLHDL, LDLDIRECT in the last 72 hours. Thyroid Function Tests: No results for input(s): TSH, T4TOTAL, FREET4, T3FREE, THYROIDAB in the last 72 hours. Anemia Panel: No results for input(s): VITAMINB12, FOLATE, FERRITIN, TIBC, IRON, RETICCTPCT in the last 72 hours. Sepsis Labs:  Recent Labs Lab 12/07/15 1126 12/07/15 1620 12/07/15 2132  LATICACIDVEN 0.73 1.0 0.9  No results found for this or any previous visit (from the past 240 hour(s)).   Radiology Studies: No results found.  Scheduled Meds: . amLODipine  10 mg Oral Daily  . carvedilol  25 mg Oral BID WC  . darbepoetin (ARANESP) injection - NON-DIALYSIS  60 mcg Subcutaneous Q Sat-1800  . heparin  5,000 Units Subcutaneous Q8H  . insulin aspart  0-20 Units Subcutaneous TID WC  . isosorbide mononitrate  30 mg Oral Daily  . pantoprazole  40 mg Oral Daily  . torsemide  60 mg Oral BID   Continuous Infusions:    LOS: 5 days   Hazeline Junker, MD [facility] Pager [phone]  If 7PM-7AM, please contact night-coverage [email or web address] Password [email or web address] 12/13/2015, 7:16 PM

## 2015-12-13 NOTE — Progress Notes (Signed)
Physical Therapy Treatment Patient Details Name: [name] MRN: [id number] DOB: 01/26/1976 Today's Date: 12/13/2015    History of Present Illness 40 yo female with ckd stage IV, presents with generalized weakness, diarrhea and decreased appetite, over 7 days. Presents with worsening volume overload.    PT Comments    Pt presented supine in bed with HOB elevated, awake and willing to participate in therapy session. Pt lying in stool when PT entered room and assistance provided for hygiene care. Pt required max A x3 for rolling bilaterally x2. Pt reported pain and fatigue following hygiene care. She was able to participate in therapeutic exercises in bed. At end of session, pt became emotional when her daughter and other family members entered to visit. Pt would continue to benefit from skilled physical therapy services at this time while admitted and after d/c to address her limitations in order to improve her overall safety and independence with functional mobility.   Follow Up Recommendations  CIR     Equipment Recommendations  Other (comment) (defer to next venue)    Recommendations for Other Services       Precautions / Restrictions Precautions Precautions: Fall Restrictions Weight Bearing Restrictions: No    Mobility  Bed Mobility Overal bed mobility: Needs Assistance;+2 for physical assistance Bed Mobility: Rolling Rolling: Max assist;+2 for physical assistance (max A x3)         General bed mobility comments: Pt participated in rolling bilaterally x2 with max A x3 for hygiene care as she was lying in stool when PT entered room.  Transfers                    Ambulation/Gait                 Stairs            Wheelchair Mobility    Modified Rankin (Stroke Patients Only)       Balance                                    Cognition Arousal/Alertness: Awake/alert Behavior During Therapy: WFL for tasks  assessed/performed Overall Cognitive Status: Within Functional Limits for tasks assessed                      Exercises General Exercises - Lower Extremity Ankle Circles/Pumps: AROM;Both;10 reps;Supine Short Arc Quad: AROM;AAROM;Both;10 reps;Supine Heel Slides: AAROM;Both;10 reps;Supine Hip ABduction/ADduction: AAROM;Both;10 reps;Supine    General Comments        Pertinent Vitals/Pain Pain Assessment: Faces Faces Pain Scale: Hurts little more Pain Location: genitals during hygiene care Pain Descriptors / Indicators: Grimacing;Moaning Pain Intervention(s): Monitored during session;Repositioned    Home Living                      Prior Function            PT Goals (current goals can now be found in the care plan section) Acute Rehab PT Goals Patient Stated Goal: to be able to mobilize PT Goal Formulation: With patient/family Time For Goal Achievement: 12/25/15 Potential to Achieve Goals: Good Progress towards PT goals: Progressing toward goals    Frequency    Min 3X/week      PT Plan Current plan remains appropriate    Co-evaluation             End of Session  Activity Tolerance: Patient limited by pain;Patient limited by fatigue Patient left: in bed;with call bell/phone within reach;with family/visitor present     Time: [id number] PT Time Calculation (min) (ACUTE ONLY): 31 min  Charges:  $Therapeutic Exercise: 8-22 mins $Therapeutic Activity: 8-22 mins                    G CodesAlessandra Bevels:      Starlee Corralejo M Willard Madrigal 12/13/2015, 3:15 PM Deborah ChalkJennifer Uchechi [place], PT, DPT [phone]

## 2015-12-13 NOTE — Progress Notes (Signed)
Inpatient Rehabilitation  I met with the patient at the bedside and spoke with pt.  and her husband, Princess Bruins, [phone],  via speaker phone.  I explained the purpose and expectations of IP Rehab.  Allena Katz asked appropriate questions and endorses that he would like his wife to come for IP Rehab. He expressed appreciation for my call.   At this time, pt.  is not demonstrating tolerance for the program, and I encouraged her to give her maximal effort with her therapies.  Additionally, I spoke with pt's nurse to request that pt. be assisted OOB to bariatric chair with lift equipment with the goal of pt. tolerating 1-2 hours at time.  I explained to pt. and husband that if pt. cannot demonstrate tolerance, I will not be able to offer her a rehab bed.  I have updated Olga Coaster, RNCM of the above.  I recommend a back up plan in the event that pt. Is unable to come to CIR.  Please call if questions.  [place] Admissions Coordinator Cell [phone] Office [phone]

## 2015-12-13 NOTE — Clinical Social Work Note (Addendum)
Awaiting return call from Chiropodistassistant director of social work regarding discussion of possible LOG for SNF, if patient agreeable.  Linda CourtSarah Cornelius Schuitema, CSW [phone]  12:13 pm CSW discussed case with Chiropodistassistant director of social work. As of right now, patient does not meet criteria for LOG. ChiropodistAssistant director asked that CSW still assess and fax out, if patient agreeable, and see if they can private pay. PT will likely see her today so CSW will wait on that note first.  Linda CourtSarah Jayceon Troy, CSW [phone]

## 2015-12-13 NOTE — Progress Notes (Signed)
[facility]  I asked her and family (at bedside) how she ambulated at home to void.  Family stated [facility]  Pt stated that she did not want to use the bedpan.  Therefore, this RN and Dider, NT, attempted [facility]  We were unsuccessful d/t reasoning that pt did not assist with any movement.  We avoided a fall to the floor by quickly placing pt back in the bed, as she did not help staff. Even with ordered bariatric chair, and extra large sling for [employer], (placed in room), this RN does not feel comfortable placing pt in chair, as I am fearful that pt will simply slide out of chair, as she gives no assistance when moving and sitting up.

## 2015-12-13 NOTE — Progress Notes (Signed)
Patient ID: [name], female   Linda DykesDOB: 05/27/1975, 40 y.o.   MRN: [id number]  [facility] Progress Note   Assessment/ Plan:   1. 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 Continues to have a fair UOP with ongoing diuretics--maintain current dose especially with stable/? possibly downtrending creatinine. 2. Exacerbation of diastolic heart failure: Ongoing efforts at optimization of diuretic therapy in order to try and alleviate features of volume overload. Symptomatically appears to be improving slowly.  3. Anemia: Consistent with anemia of chronic disease, status post initiation of Aranesp therapy. Iron stores appear replete.  4. Hypertension: Blood pressures appear to be improving in control on the current regimen of amlodipine, carvedilol and isosorbide. 5. Proximal ulcer of the right leg-stage II lesion: Ongoing local wound care in this nonambulatory/bedbound patient.  Subjective:   Reports to be feeling somewhat better--frustrated by hospital LOS.    Objective:   BP (!) 142/61 (BP Location: Left Arm)   Pulse 81   Temp 99.1 F (37.3 C) (Oral)   Resp 18   Ht 5\' 5"  (1.651 m)   Wt (!) 174.6 kg (385 lb) Comment: bed; rn notified of bed weight  SpO2 99%   BMI 64.07 kg/m   Intake/Output Summary (Last 24 hours) at 12/13/15 0843 Last data filed at 12/13/15 0112  Gross per 24 hour  Intake              240 ml  Output             1875 ml  Net            -1635 ml   Weight change: -0.907 kg (-2 lb)  Physical Exam: WUJ:WJXBJYNGen:Appears to be comfortable resting in bed CVS: Pulse regular rhythm, S1 and S2 normal without any gallop Resp: Distant breath sounds bilaterally-anterior chest Abd: Soft, obese, nontender Ext: 2+ left lower extremity edema, 1-2+ right lower extremity edema with clean gauze  dressing.  Imaging: No results found.  Labs: BMET  Recent Labs Lab 12/07/15 1119 12/07/15 1615 12/07/15 2132 12/08/15 0250 12/09/15 0221 12/10/15 0721 12/11/15 0914 12/12/15 1131 12/13/15 0323  NA 137  --   --  137 138 137 138 138 139  K 4.6  --   --  4.5 4.3 4.1 4.0 3.9 3.7  CL 110  --   --  112* 112* 113* 112* 113* 111  CO2 17*  --   --  17* 17* 16* 18* 18* 18*  GLUCOSE 130*  --   --  134* 127* 115* 115* 120* 123*  BUN 49*  --   --  52* 56* 59* 60* 61* 57*  CREATININE 5.49*  --  5.56* 5.36* 5.37* 5.30* 5.34* 5.36* 5.28*  CALCIUM 8.6*  --   --  8.7* 8.4* 8.3* 8.4* 8.1* 8.3*  PHOS  --  4.6  --  4.9*  --   --   --   --   --    CBC  Recent Labs Lab 12/09/15 0221 12/10/15 0721 12/11/15 0914 12/13/15 0323  WBC 10.2 9.1 11.0* 15.4*  NEUTROABS 7.9* 6.7 8.0* 12.4*  HGB 6.8* 7.5* 7.8* 7.8*  HCT 20.8* 23.0* 24.4* 24.4*  MCV 83.5 83.0 87.1 84.7  PLT 302 328 412* 479*    Medications:    . amLODipine  10 mg Oral Daily  . carvedilol  25  mg Oral BID WC  . darbepoetin (ARANESP) injection - NON-DIALYSIS  60 mcg Subcutaneous Q Sat-1800  . heparin  5,000 Units Subcutaneous Q8H  . insulin aspart  0-20 Units Subcutaneous TID WC  . isosorbide mononitrate  30 mg Oral Daily  . metolazone  5 mg Oral BID  . pantoprazole  40 mg Oral Daily  . torsemide  60 mg Oral BID   Linda BillsJay Katessa Attridge, MD 12/13/2015, 8:43 AM

## 2015-12-13 NOTE — Progress Notes (Signed)
Foley was placed on 10.28.17 @ 1120, and today is the 4th day since placement.  Have requested from attending for D/C order.  Awaiting reply.

## 2015-12-13 NOTE — Progress Notes (Signed)
Orders received to D/C foley 12/13/15 @ 1621.  Foley has now been discontinued without complication.

## 2015-12-14 LAB — RENAL FUNCTION PANEL
Albumin: 1.9 g/dL — ABNORMAL LOW (ref 3.5–5.0)
Anion gap: 9 (ref 5–15)
BUN: 56 mg/dL — ABNORMAL HIGH (ref 6–20)
CO2: 18 mmol/L — ABNORMAL LOW (ref 22–32)
Calcium: 8.2 mg/dL — ABNORMAL LOW (ref 8.9–10.3)
Chloride: 111 mmol/L (ref 101–111)
Creatinine, Ser: 4.89 mg/dL — ABNORMAL HIGH (ref 0.44–1.00)
GFR calc Af Amer: 12 mL/min — ABNORMAL LOW (ref >60)
GFR calc non Af Amer: 10 mL/min — ABNORMAL LOW (ref >60)
Glucose, Bld: 93 mg/dL (ref 65–99)
Phosphorus: 5.1 mg/dL — ABNORMAL HIGH (ref 2.5–4.6)
Potassium: 3.9 mmol/L (ref 3.5–5.1)
Sodium: 138 mmol/L (ref 135–145)

## 2015-12-14 LAB — GLUCOSE, CAPILLARY
GLUCOSE-CAPILLARY: 102 mg/dL — AB (ref 65–99)
Glucose-Capillary: 105 mg/dL — ABNORMAL HIGH (ref 65–99)
Glucose-Capillary: 111 mg/dL — ABNORMAL HIGH (ref 65–99)
Glucose-Capillary: 130 mg/dL — ABNORMAL HIGH (ref 65–99)

## 2015-12-14 LAB — CBC
HCT: 24.3 % — ABNORMAL LOW (ref 36.0–46.0)
Hemoglobin: 7.7 g/dL — ABNORMAL LOW (ref 12.0–15.0)
MCH: 27.1 pg (ref 26.0–34.0)
MCHC: 31.7 g/dL (ref 30.0–36.0)
MCV: 85.6 fL (ref 78.0–100.0)
PLATELETS: 460 10*3/uL — AB (ref 150–400)
RBC: 2.84 MIL/uL — AB (ref 3.87–5.11)
RDW: 14.8 % (ref 11.5–15.5)
WBC: 17 10*3/uL — AB (ref 4.0–10.5)

## 2015-12-14 MED ORDER — NEPRO/CARBSTEADY PO LIQD
237.0000 mL | Freq: Two times a day (BID) | ORAL | Status: DC
  Administered 2015-12-15: 237 mL via ORAL
  Filled 2015-12-14 (×4): qty 237

## 2015-12-14 NOTE — Progress Notes (Signed)
Patient ID: [name], female   DOB: 12/23/1975, 40 y.o.   MRN: [id number]  [facility] Progress Note   Assessment/ Plan:   1. 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 With good response to ongoing diuretic therapy and gradual but slow decline of creatinine noted. 2. Exacerbation of diastolic heart failure: Ongoing efforts at optimization of diuretic therapy in order to try and alleviate features of volume overload. Symptomatically appears to be improving slowly.  3. Anemia: Consistent with anemia of chronic disease, status post initiation of Aranesp therapy. Iron stores appear replete.  4. Hypertension: Blood pressures appear to be improving in control on the current regimen of amlodipine, carvedilol and isosorbide. 5. Proximal ulcer of the right leg-stage II lesion: Ongoing local wound care in this nonambulatory/bedbound patient.  Subjective:   In an upbeat mood today and states that she feels better. Denies any chest pain or shortness of breath.    Objective:   BP (!) 151/66 (BP Location: Left Wrist)   Pulse 80   Temp 98.6 F (37 C) (Oral)   Resp 18   Ht 5\' 5"  (1.651 m)   Wt (!) 173.7 kg (383 lb)   SpO2 99%   BMI 63.73 kg/m   Intake/Output Summary (Last 24 hours) at 12/14/15 0901 Last data filed at 12/14/15 0856  Gross per 24 hour  Intake              710 ml  Output             2150 ml  Net            -1440 ml   Weight change: -0.907 kg (-2 lb)  Physical Exam: WUJ:WJXBJYNGen:Appears to be comfortable resting in bed, CNA at bedside checking her blood sugar CVS: Pulse regular rhythm, S1 and S2 normal without any gallop Resp: Distant breath sounds bilaterally-anterior chest Abd: Soft, obese, nontender Ext: 2+ left lower extremity edema, 1-2+ right lower  extremity edema with clean gauze dressing.  Imaging: No results found.  Labs: BMET  Recent Labs Lab 12/07/15 1615  12/08/15 0250 12/09/15 0221 12/10/15 0721 12/11/15 0914 12/12/15 1131 12/13/15 0323 12/14/15 0402  NA  --   --  137 138 137 138 138 139 138  K  --   --  4.5 4.3 4.1 4.0 3.9 3.7 3.9  CL  --   --  112* 112* 113* 112* 113* 111 111  CO2  --   --  17* 17* 16* 18* 18* 18* 18*  GLUCOSE  --   --  134* 127* 115* 115* 120* 123* 93  BUN  --   --  52* 56* 59* 60* 61* 57* 56*  CREATININE  --   < > 5.36* 5.37* 5.30* 5.34* 5.36* 5.28* 4.89*  CALCIUM  --   --  8.7* 8.4* 8.3* 8.4* 8.1* 8.3* 8.2*  PHOS 4.6  --  4.9*  --   --   --   --   --  5.1*  < > = values in this interval not displayed. CBC  Recent Labs Lab 12/09/15 0221 12/10/15 0721 12/11/15 0914 12/13/15 0323 12/14/15 0402  WBC 10.2 9.1 11.0* 15.4* 17.0*  NEUTROABS 7.9* 6.7 8.0* 12.4*  --   HGB 6.8* 7.5* 7.8* 7.8* 7.7*  HCT 20.8* 23.0* 24.4* 24.4* 24.3*  MCV 83.5 83.0  87.1 84.7 85.6  PLT 302 328 412* 479* 460*    Medications:    . amLODipine  10 mg Oral Daily  . carvedilol  25 mg Oral BID WC  . darbepoetin (ARANESP) injection - NON-DIALYSIS  60 mcg Subcutaneous Q Sat-1800  . heparin  5,000 Units Subcutaneous Q8H  . insulin aspart  0-20 Units Subcutaneous TID WC  . isosorbide mononitrate  30 mg Oral Daily  . pantoprazole  40 mg Oral Daily  . torsemide  60 mg Oral BID   Zetta BillsJay Jacob Cicero, MD 12/14/2015, 9:01 AM

## 2015-12-14 NOTE — Progress Notes (Signed)
PROGRESS NOTE    [name]  WUJ:[id number] DOB: 04/17/1975 DOA: 12/07/2015 PCP: Dorrene GermanEdwin A Avbuere, MD   Brief Narrative:  40 yo female with CKD stage IV (baseline Cr 2.5), chronic diastolic CHF, uncontrolled DM, and morbid obesity presented 10/26 with generalized weakness, diarrhea and decreased appetite, found to be severely edematous and weak. Creatinine was elevated in the setting of anasarca, which has improved very slowly with diuresis. Nephrology has been following and diuretic therapy has been transitioned to oral torsemide. Disposition to inpatient rehabilitation was planned, though the patient has not demonstrated reliable tolerance of activity.    Assessment & Plan:   Principal Problem:   Dyspnea Active Problems:   Thigh abscess   AKI (acute kidney injury) (HCC)   Anasarca   Asthma   Essential hypertension   CHF (congestive heart failure) (HCC)   Acute on chronic systolic congestive heart failure (HCC)   Venous stasis ulcer of right calf without varicose veins (HCC)   History of CVA with residual deficit   Super-super obese (HCC)   Acute kidney injury superimposed on chronic kidney disease (HCC)   Anemia of chronic disease   Lymphedema   Uncontrolled type 2 diabetes mellitus with complication, with long-term current use of insulin (HCC)   Lymphocytosis   Debility  Acute exacerbation of chronic diastolic CHF: 2016 echo with EF 55-60%, G1DD, mild-mod LVH. Symptomatically improving slowly.  - Continue po diuresis ~2.15L out /24hrs with creatinine actually improving. - Daily weights inaccurate; continue strict I/O - No longer with IV diuresis, D/C foley.  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 Cr elevation somewhat expected given habitus. UOP has picked up slowly. Persistent NAGMA without indications for HD. - Continue torsemide 60 mg bid and metolazone 5 mg bid.  - Follow nephrology recommendations.  - Monitoring renal  function panel - If stable on current diuretic dosing, will be cleared for discharge 11/3.    HTN: Chronic, stable.  - Continue amlodipine, coreg and isosorbide. Aggressive diuresis.  Asthma: Chronic, stable. No exacerbation. Dyspnea likely additionally worsened by OHS.  - Continue Bronchodilator therapy with duonebs.  T2DM: Last HbA1c 7.8% in Dec 2016. - CBG and SSI qAC/HS. At inpatient goal   Morbid obesity: Root cause of many conditions including deconditioning - DVT ppx - PT eval, recommending CIR.   Right leg, proximal, ulcerated lesion stage 2: Patient is not ambulatory, bed bound, no evidence of infection. - Continue local wound care.   Anemia of chronic disease. Hb has been stable 7.8. Transferrin saturation 23 with ferritin 943.  - Will follow cell count.  - Continue aranesp, presuming this is cause of leukocytosis in absence of source of infection.   Dyspepsia and reflux.  - Tolerating po well, continue antiacid therapy.   Subjective: Patient feeling better again today, dyspnea improved, persistent edema lower extremities. Improved chest pain, no nausea or vomiting.   Objective: Vitals:   12/13/15 1258 12/13/15 2225 12/14/15 0612 12/14/15 0740  BP: (!) 143/63 (!) 143/70 136/69 (!) 151/66  Pulse: 77 80 80 80  Resp: 20 20 18 18   Temp: 98.6 F (37 C) 98.7 F (37.1 C) 98.4 F (36.9 C) 98.6 F (37 C)  TempSrc: Oral Oral Oral Oral  SpO2: 98% 99% 96% 99%  Weight:   (!) 173.7 kg (383 lb)   Height:        Intake/Output Summary (Last 24 hours) at 12/14/15 1503 Last data filed at 12/14/15 1415  Gross per  24 hour  Intake             1068 ml  Output             1200 ml  Net             -132 ml   Filed Weights   12/12/15 0458 12/13/15 0641 12/14/15 0612  Weight: (!) 175.5 kg (387 lb) (!) 174.6 kg (385 lb) (!) 173.7 kg (383 lb)    Examination:  General exam: Morbidly obese 40 y.o. female in no distress E ENT: mild conjunctival pallor, oral mucosa  moist. Respiratory system: Nonlabored. Globally distant breath sound anteriorly. Mild decreased breath sounds at the dependent zones, no wheezing, rales or rhonchi.   Cardiovascular system: S1 & S2 heard, RRR. No JVD, murmurs, rubs, gallops or clicks. pedal edema 3+ pitting. Gastrointestinal system: Abdomen is obese, nondistended, soft and nontender. No organomegaly or masses felt. Normal bowel sounds heard. Central nervous system: Alert and oriented. No focal neurological deficits but diffusely deconditioned. Extremities: No fedormities Skin: No rashes, lesions or ulcers   Data Reviewed: I have personally reviewed following labs and imaging studies  CBC:  Recent Labs Lab 12/09/15 0221 12/10/15 0721 12/11/15 0914 12/13/15 0323 12/14/15 0402  WBC 10.2 9.1 11.0* 15.4* 17.0*  NEUTROABS 7.9* 6.7 8.0* 12.4*  --   HGB 6.8* 7.5* 7.8* 7.8* 7.7*  HCT 20.8* 23.0* 24.4* 24.4* 24.3*  MCV 83.5 83.0 87.1 84.7 85.6  PLT 302 328 412* 479* 460*   Basic Metabolic Panel:  Recent Labs Lab 12/07/15 1615  12/08/15 0250  12/10/15 0721 12/11/15 0914 12/12/15 1131 12/13/15 0323 12/14/15 0402  NA  --   --  137  < > 137 138 138 139 138  K  --   --  4.5  < > 4.1 4.0 3.9 3.7 3.9  CL  --   --  112*  < > 113* 112* 113* 111 111  CO2  --   --  17*  < > 16* 18* 18* 18* 18*  GLUCOSE  --   --  134*  < > 115* 115* 120* 123* 93  BUN  --   --  52*  < > 59* 60* 61* 57* 56*  CREATININE  --   < > 5.36*  < > 5.30* 5.34* 5.36* 5.28* 4.89*  CALCIUM  --   --  8.7*  < > 8.3* 8.4* 8.1* 8.3* 8.2*  MG 2.0  --   --   --   --   --   --   --   --   PHOS 4.6  --  4.9*  --   --   --   --   --  5.1*  < > = values in this interval not displayed. GFR: Estimated Creatinine Clearance: 25.3 mL/min (by C-G formula based on SCr of 4.89 mg/dL (H)). Liver Function Tests:  Recent Labs Lab 12/08/15 0250 12/14/15 0402  ALBUMIN 2.2* 1.9*   No results for input(s): LIPASE, AMYLASE in the last 168 hours. No results for  input(s): AMMONIA in the last 168 hours. Coagulation Profile: No results for input(s): INR, PROTIME in the last 168 hours. Cardiac Enzymes:  Recent Labs Lab 12/07/15 1615 12/07/15 2132 12/08/15 0250  TROPONINI 0.04* 0.19* 0.09*   BNP (last 3 results) No results for input(s): PROBNP in the last 8760 hours. HbA1C: No results for input(s): HGBA1C in the last 72 hours. CBG:  Recent Labs Lab 12/13/15 1151 12/13/15 1621 12/13/15  2204 12/14/15 0611 12/14/15 1206  GLUCAP 127* 155* 125* 102* 105*   Lipid Profile: No results for input(s): CHOL, HDL, LDLCALC, TRIG, CHOLHDL, LDLDIRECT in the last 72 hours. Thyroid Function Tests: No results for input(s): TSH, T4TOTAL, FREET4, T3FREE, THYROIDAB in the last 72 hours. Anemia Panel: No results for input(s): VITAMINB12, FOLATE, FERRITIN, TIBC, IRON, RETICCTPCT in the last 72 hours. Sepsis Labs:  Recent Labs Lab 12/07/15 1620 12/07/15 2132  LATICACIDVEN 1.0 0.9   No results found for this or any previous visit (from the past 240 hour(s)).   Radiology Studies: No results found.  Scheduled Meds: . amLODipine  10 mg Oral Daily  . carvedilol  25 mg Oral BID WC  . darbepoetin (ARANESP) injection - NON-DIALYSIS  60 mcg Subcutaneous Q Sat-1800  . heparin  5,000 Units Subcutaneous Q8H  . insulin aspart  0-20 Units Subcutaneous TID WC  . isosorbide mononitrate  30 mg Oral Daily  . pantoprazole  40 mg Oral Daily  . torsemide  60 mg Oral BID   Continuous Infusions:    LOS: 6 days   Hazeline Junker, MD [facility] Pager [phone]  If 7PM-7AM, please contact night-coverage [email or web address] Password [email or web address] 12/14/2015, 3:03 PM

## 2015-12-14 NOTE — Clinical Social Work Note (Signed)
Per RNCM, patient said if she cannot go to CIR she wants to go home.  CSW signing off. Consult again if any other social work needs arise.  Charlynn CourtSarah Robet Crutchfield, CSW [phone]

## 2015-12-14 NOTE — Progress Notes (Signed)
Inpatient Rehabilitation  Received a call from PT that patient was able to sit edge of bed for 20 minutes with Supervision and vitals remaining WLF.  Additionally, she was able to bear weight, and worked on standing for short periods of time today.  I called Steward DroneBrenda, RNCM to inform that I would like to present patient to rehab MD in the morning in hopes of admitting her tomorrow.  Plan to follow up in the morning as soon as I know.  Please call with questions.   Charlane FerrettiMelissa Toi Stelly, M.A., CCC/SLP Admission Coordinator  [facility]  Cell [phone]

## 2015-12-14 NOTE — Care Management Note (Signed)
Case Management Note  Patient Details  Name: Linda DykesDavenia McKinnon [name] MRN: [id number] Date of Birth: 03/19/1975  Subjective/Objective:          Admitted with Dyspnea, CHF          Action/Plan: Patient lives at home with spouse, he is very involved in her care; has private insurance with Medicaid. Patient stated that [facility] she requested to go home at discharge; [facility], [facility]; [facility].  Expected Discharge Date:    12/14/2015              Expected Discharge Plan:  Home w Home Health Services  Discharge planning Services  CM Consult Choice offered to:  Patient  [facility]  [facility]  Status of Service:  In process, will continue to follow  Reola MosherChandler, Terek Bee L, RN,MHA,BSN [phone] 12/14/2015, 12:18 PM

## 2015-12-14 NOTE — Progress Notes (Signed)
Inpatient Rehabilitation  Following up for potential IP Rehab candidacy.  Upon reviewing PT and nursing notes from yesterday afternoon patient unable to get out of bed to chair.  As a result, I do not have a bed to offer her at this time.  Patient has not demonstrated the ability to participate in 3 hours of therapy a day.  I have [facility] with [facility] RNCM.  If patient remains in house plan to continue to follow in hopes of therapy toleration.  Please call with questions.    Linda FerrettiMelissa Mayumi [name], M.A., CCC/SLP Admission Coordinator  [facility]  Cell [phone]

## 2015-12-14 NOTE — Progress Notes (Signed)
Physical Therapy Treatment Patient Details Name: Linda DykesDavenia McKinnon [name] MRN: [id number] DOB: 06/04/1975 Today's Date: 12/14/2015    History of Present Illness 40 yo female with ckd stage IV, presents with generalized weakness, diarrhea and decreased appetite, over 7 days. Presents with worsening volume overload.    PT Comments    Pt presented supine in bed with HOB elevated, awake and willing to participate in therapy session. Pt very eager to participate and motivated to move this session. She was able to perform bed mobility to achieve sitting EOB with decreased assistance as compared to previous session. Pt tolerated sitting EOB for 20 minutes with supervision for safety. Pt also able to perform STS x2 with mod A x2. Pt standing for ~30 seconds first bout and ~15 seconds the second bout. Pt would continue to benefit from skilled physical therapy services at this time while admitted and after d/c to address her limitations in order to improve her overall safety and independence with functional mobility.   Follow Up Recommendations  CIR     Equipment Recommendations  Other (comment) (defer to next venue)    Recommendations for Other Services Rehab consult     Precautions / Restrictions Precautions Precautions: Fall Restrictions Weight Bearing Restrictions: No    Mobility  Bed Mobility Overal bed mobility: Needs Assistance;+2 for physical assistance Bed Mobility: Supine to Sit;Sit to Supine     Supine to sit: Mod assist;+2 for physical assistance;HOB elevated Sit to supine: Mod assist;+2 for physical assistance   General bed mobility comments: pt required increased time, use of bed rails, VC'ing for technique and mod A with bilateral LEs. Pt able to sit EOB with supervision x20 minutes.   Transfers Overall transfer level: Needs assistance   Transfers: Sit to/from Stand Sit to Stand: Mod assist;+2 physical assistance         General transfer comment: pt able to perform  STS x2 from bed with mattress deflated. Pt standing ~30 seconds during first bout and ~15 seconds the second bout  Ambulation/Gait                 Stairs            Wheelchair Mobility    Modified Rankin (Stroke Patients Only)       Balance Overall balance assessment: Needs assistance Sitting-balance support: Feet supported;No upper extremity supported Sitting balance-Leahy Scale: Fair Sitting balance - Comments: pt able to tolerate sitting EOB for 20 minutes with supervision for safety                            Cognition Arousal/Alertness: Awake/alert Behavior During Therapy: WFL for tasks assessed/performed Overall Cognitive Status: Within Functional Limits for tasks assessed                      Exercises      General Comments        Pertinent Vitals/Pain Pain Assessment: Faces Faces Pain Scale: Hurts little more Pain Location: bilateral LEs (in standing) Pain Descriptors / Indicators: Sore;Other (Comment) (weak) Pain Intervention(s): Monitored during session;Repositioned    Home Living                      Prior Function            PT Goals (current goals can now be found in the care plan section) Acute Rehab PT Goals Patient Stated Goal: to ambulate again PT Goal  Formulation: With patient/family Time For Goal Achievement: 12/25/15 Potential to Achieve Goals: Good Progress towards PT goals: Progressing toward goals    Frequency    Min 3X/week      PT Plan Current plan remains appropriate    Co-evaluation             End of Session Equipment Utilized During Treatment: Gait belt;Other (comment) (sheet used as bariatric gait belt) Activity Tolerance: Patient tolerated treatment well Patient left: in bed;with call bell/phone within reach     Time: 1343-1430 PT Time Calculation (min) (ACUTE ONLY): 47 min  Charges:  $Therapeutic Activity: 38-52 mins                    G CodesAlessandra Bevels:      Nereida Schepp M  Lekeisha Arenas 12/14/2015, 2:54 PM Deborah ChalkJennifer Lakesa Coste, PT, DPT [phone]

## 2015-12-15 LAB — GLUCOSE, CAPILLARY
Glucose-Capillary: 101 mg/dL — ABNORMAL HIGH (ref 65–99)
Glucose-Capillary: 115 mg/dL — ABNORMAL HIGH (ref 65–99)

## 2015-12-15 LAB — RENAL FUNCTION PANEL
Albumin: 1.8 g/dL — ABNORMAL LOW (ref 3.5–5.0)
Anion gap: 8 (ref 5–15)
BUN: 57 mg/dL — AB (ref 6–20)
CALCIUM: 8.5 mg/dL — AB (ref 8.9–10.3)
CO2: 19 mmol/L — AB (ref 22–32)
CREATININE: 4.91 mg/dL — AB (ref 0.44–1.00)
Chloride: 112 mmol/L — ABNORMAL HIGH (ref 101–111)
GFR calc non Af Amer: 10 mL/min — ABNORMAL LOW (ref >60)
GFR, EST AFRICAN AMERICAN: 12 mL/min — AB (ref >60)
GLUCOSE: 112 mg/dL — AB (ref 65–99)
Phosphorus: 5 mg/dL — ABNORMAL HIGH (ref 2.5–4.6)
Potassium: 3.7 mmol/L (ref 3.5–5.1)
SODIUM: 139 mmol/L (ref 135–145)

## 2015-12-15 MED ORDER — TORSEMIDE 20 MG PO TABS: 60.0000 mg | ORAL_TABLET | Freq: Two times a day (BID) | ORAL | 0 refills | Status: DC

## 2015-12-15 NOTE — Discharge Summary (Signed)
Physician Discharge Summary  Linda DykesDavenia McKinnon [name] WUJ:[id number] DOB: 06/16/1975 DOA: 12/07/2015  PCP: Dorrene GermanEdwin A Avbuere, MD  Admit date: 12/07/2015 Discharge date: 12/15/2015  Admitted From: Home Disposition: Home, against advice to discharge to inpatient rehabilitation   Recommendations for Outpatient Follow-up:  1. Follow up with PCP in 1-2 weeks 2. Please obtain BMP within 1-2 weeks to monitor electrolytes and renal function. 3. Monitor volume status, discharged on torsemide 60mg  BID  Home Health: RN (insurane would not cover therapy services) Equipment/Devices: No new DME  Discharge Condition: Stable CODE STATUS: Full Diet recommendation: Heart healthy  Brief/Interim Summary: 40 yo female with CKD stage IV (baseline Cr 2.5), chronic diastolic CHF, uncontrolled DM, and morbid obesity presented 10/26 with generalized weakness, diarrhea and decreased appetite, found to be severely edematous and weak. Creatinine was elevated in the setting of anasarca, which has improved very slowly with diuresis. Nephrology has been following and diuretic therapy has been transitioned to oral torsemide. She is medically stable for discharge and disposition to inpatient rehabilitation was recommended and planned, though patient declined this and instead opts for discharge to home.   Discharge Diagnoses:  Principal Problem:   Dyspnea Active Problems:   Thigh abscess   AKI (acute kidney injury) (HCC)   Anasarca   Asthma   Essential hypertension   CHF (congestive heart failure) (HCC)   Acute on chronic systolic congestive heart failure (HCC)   Venous stasis ulcer of right calf without varicose veins (HCC)   History of CVA with residual deficit   Super-super obese (HCC)   Acute kidney injury superimposed on chronic kidney disease (HCC)   Anemia of chronic disease   Lymphedema   Uncontrolled type 2 diabetes mellitus with complication, with long-term current use of insulin (HCC)   Lymphocytosis  Debility  Acute exacerbation of chronic diastolic CHF: 2016 echo with EF 55-60%, G1DD, mild-mod LVH. Symptomatically improving slowly.  - Continue po diuresis and will need creatinine monitoring.  - Daily weights inaccurate; continue strict I/O - No longer with IV diuresis, D/C foley.  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 Cr elevation somewhat expected given habitus. UOP has picked up slowly. Persistent NAGMA without indications for HD. - Continue torsemide 60 mg bid. - Follow nephrology recommendations: Dr. Allena KatzPatel contacted and agrees with discharge to CIR, will be able to see her over the weekend there.  - Monitoring renal function panel  HTN: Chronic, stable.  - Continue amlodipine, coreg and isosorbide. Aggressive diuresis.  Asthma: Chronic, stable. No exacerbation. Dyspnea likely additionally worsened by OHS.  - Continue Bronchodilator therapy with duonebs.  T2DM: Last HbA1c 7.8% in Dec 2016. - CBG and SSI qAC/HS. At inpatient goal   Morbid obesity: Root cause of many conditions including deconditioning - DVT ppx - PT eval, recommending CIR.   Right leg, proximal, ulcerated lesion stage 2: Patient is not ambulatory, bed bound, no evidence of infection. - Continue local wound care.   Anemia of chronic disease. Hb has been stable 7.8. Transferrin saturation 23 with ferritin 943.  - Will follow cell count.  - Continue aranesp, presuming this is cause of leukocytosis in absence of source of infection.   Dyspepsia and reflux.  - Tolerating po well, continue antiacid therapy.   Discharge Instructions Discharge Instructions    (HEART FAILURE PATIENTS) Call MD:  Anytime you have any of the following symptoms: 1) 3 pound weight gain in 24 hours or 5 pounds in 1 week 2) shortness of breath,  with or without a dry hacking cough 3) swelling in the hands, feet or stomach 4) if you have to sleep on extra pillows at night in order to breathe.     Complete by:  As directed    Consult to care management    Complete by:  As directed    Patient will need home dressing changes   Diet - low sodium heart healthy    Complete by:  As directed    Discharge instructions    Complete by:  As directed    You were admitted for volume overload which is improving. The only change to medications is to STOP lasix and replace it by STARTING TORSEMIDE 60mg  twice daily. This has been sent to your pharmacy. It's important that you follow up with your doctor within the next 1-2 weeks to monitor your blood work while you're on this.   Increase activity slowly    Complete by:  As directed        Medication List    STOP taking these medications   furosemide 80 MG tablet Commonly known as:  LASIX     TAKE these medications   acetaminophen 500 MG tablet Commonly known as:  TYLENOL Take 1,000 mg by mouth every 6 (six) hours as needed for fever (pain).   albuterol 108 (90 Base) MCG/ACT inhaler Commonly known as:  PROAIR HFA Inhale 2 puffs into the lungs every 6 (six) hours as needed for wheezing or shortness of breath.   albuterol (2.5 MG/3ML) 0.083% nebulizer solution Commonly known as:  PROVENTIL Take 3 mLs (2.5 mg total) by nebulization every 6 (six) hours as needed for wheezing or shortness of breath.   amLODipine 10 MG tablet Commonly known as:  NORVASC Take 1 tablet (10 mg total) by mouth daily.   carvedilol 25 MG tablet Commonly known as:  COREG Take 1 tablet (25 mg total) by mouth 2 (two) times daily with a meal.   ferrous sulfate 325 (65 FE) MG tablet Take 1 tablet (325 mg total) by mouth 3 (three) times daily with meals.   hydrALAZINE 50 MG tablet Commonly known as:  APRESOLINE Take 50 mg by mouth 3 (three) times daily.   insulin aspart 100 UNIT/ML injection Commonly known as:  novoLOG Inject 0-20 Units into the skin 3 (three) times daily with meals. CBG < 70: implement hypoglycemia protocol CBG 70 - 120: 0 units CBG 121 -  150: 3 units CBG 151 - 200: 4 units CBG 201 - 250: 7 units CBG 251 - 300: 11 units CBG 301 - 350: 15 units CBG 351 - 400: 20 units   insulin glargine 100 UNIT/ML injection Commonly known as:  LANTUS Inject 0.3 mLs (30 Units total) into the skin daily. What changed:  how much to take  when to take this  reasons to take this   isosorbide mononitrate 30 MG 24 hr tablet Commonly known as:  IMDUR Take 1 tablet (30 mg total) by mouth daily.   potassium chloride SA 20 MEQ tablet Commonly known as:  K-DUR,KLOR-CON Take 2 tablets (40 mEq total) by mouth daily.   torsemide 20 MG tablet Commonly known as:  DEMADEX Take 3 tablets (60 mg total) by mouth 2 (two) times daily.   vitamin A 8000 UNIT capsule Take 8,000 Units by mouth daily.   vitamin C 1000 MG tablet Take 1,000 mg by mouth daily.   Zinc 50 MG Tabs Take 50 mg by mouth daily.  Follow-up Information    [name], MD Follow up on 12/18/2015.   Specialty:  Internal Medicine Why:  At 12:30pm for hospital follow up  Contact information: [address] [place] [address] [phone]        [facility] .   Why:  A home health care nurse will go to your home Contact information: [address] [place] [address] [phone]          Allergies  Allergen Reactions  . Azithromycin Other (See Comments)    Nose bleeding event    Consultations:  Nephrology, Dr. Allena Katz  Procedures/Studies: Dg Chest 2 View  Result Date: 12/07/2015 CLINICAL DATA:  Persistent shortness of breath for 3 days. Hypertension, CHF, diabetes EXAM: CHEST  2 VIEW COMPARISON:  02/20/2015 FINDINGS: Limited exam because of obese body habitus. Cardiomegaly evident with vascular congestion centrally. Low lung volumes noted. No definite focal pneumonia, collapse or consolidation. No current edema, large effusion or pneumothorax. Trachea is midline. Lateral view is limited because of underexposure.  IMPRESSION: Cardiomegaly with vascular congestion and low lung volumes. No current significant CHF or pneumonia. Electronically Signed   By: Judie Petit.  Shick M.D.   On: 12/07/2015 10:45     Subjective: Pt feels better, no dyspnea or chest pain, swelling better. Initially agreed to CIR, but now wants to go home instead.   Discharge Exam: Vitals:   12/15/15 0857 12/15/15 1201  BP: (!) 150/65 (!) 145/71  Pulse: 81 81  Resp:  18  Temp: 98.6 F (37 C) 98.7 F (37.1 C)   Vitals:   12/14/15 2036 12/15/15 0535 12/15/15 0857 12/15/15 1201  BP: (!) 151/72 (!) 153/70 (!) 150/65 (!) 145/71  Pulse: 83 81 81 81  Resp: 19 18  18   Temp: 98.4 F (36.9 C) 99 F (37.2 C) 98.6 F (37 C) 98.7 F (37.1 C)  TempSrc: Oral Oral Oral Oral  SpO2: 100% 99% 100% 100%  Weight:      Height:       General: Pt is morbidly obese in no acute distress Cardiovascular: S1 & S2 heard, RRR. No JVD, murmurs, rubs, gallops or clicks. Generalized 1+ pitting edema with 3+ bilateral LE edema. Respiratory: Nonlabored, Globally distant breath sound anteriorly. Mild decreased breath sounds at the dependent zones, no wheezing, rales or rhonchi.   Abdominal: Soft, NT, ND, bowel sounds + Extremities: no edema, no cyanosis  The results of significant diagnostics from this hospitalization (including imaging, microbiology, ancillary and laboratory) are listed below for reference.    Microbiology: No results found for this or any previous visit (from the past 240 hour(s)).   Labs: BNP (last 3 results)  Recent Labs  02/04/15 0031 02/19/15 1123 12/07/15 1119  BNP 349.2* 921.9* 375.4*   Basic Metabolic Panel:  Recent Labs Lab 12/11/15 0914 12/12/15 1131 12/13/15 0323 12/14/15 0402 12/15/15 0226  NA 138 138 139 138 139  K 4.0 3.9 3.7 3.9 3.7  CL 112* 113* 111 111 112*  CO2 18* 18* 18* 18* 19*  GLUCOSE 115* 120* 123* 93 112*  BUN 60* 61* 57* 56* 57*  CREATININE 5.34* 5.36* 5.28* 4.89* 4.91*  CALCIUM 8.4* 8.1* 8.3*  8.2* 8.5*  PHOS  --   --   --  5.1* 5.0*   Liver Function Tests:  Recent Labs Lab 12/14/15 0402 12/15/15 0226  ALBUMIN 1.9* 1.8*   No results for input(s): LIPASE, AMYLASE in the last 168 hours. No results for input(s): AMMONIA in the  last 168 hours. CBC:  Recent Labs Lab 12/09/15 0221 12/10/15 0721 12/11/15 0914 12/13/15 0323 12/14/15 0402  WBC 10.2 9.1 11.0* 15.4* 17.0*  NEUTROABS 7.9* 6.7 8.0* 12.4*  --   HGB 6.8* 7.5* 7.8* 7.8* 7.7*  HCT 20.8* 23.0* 24.4* 24.4* 24.3*  MCV 83.5 83.0 87.1 84.7 85.6  PLT 302 328 412* 479* 460*   Cardiac Enzymes: No results for input(s): CKTOTAL, CKMB, CKMBINDEX, TROPONINI in the last 168 hours. BNP: Invalid input(s): POCBNP CBG:  Recent Labs Lab 12/14/15 1206 12/14/15 1710 12/14/15 2119 12/15/15 0611 12/15/15 1159  GLUCAP 105* 111* 130* 101* 115*   D-Dimer No results for input(s): DDIMER in the last 72 hours. Hgb A1c No results for input(s): HGBA1C in the last 72 hours. Lipid Profile No results for input(s): CHOL, HDL, LDLCALC, TRIG, CHOLHDL, LDLDIRECT in the last 72 hours. Thyroid function studies No results for input(s): TSH, T4TOTAL, T3FREE, THYROIDAB in the last 72 hours.  Invalid input(s): FREET3 Anemia work up No results for input(s): VITAMINB12, FOLATE, FERRITIN, TIBC, IRON, RETICCTPCT in the last 72 hours. Urinalysis    Component Value Date/Time   COLORURINE YELLOW (A) 12/07/2015 1112   APPEARANCEUR TURBID (A) 12/07/2015 1112   LABSPEC 1.010 12/07/2015 1112   PHURINE 5.5 12/07/2015 1112   GLUCOSEU NEGATIVE 12/07/2015 1112   HGBUR NEGATIVE 12/07/2015 1112   BILIRUBINUR NEGATIVE 12/07/2015 1112   KETONESUR NEGATIVE 12/07/2015 1112   PROTEINUR 100 (A) 12/07/2015 1112   UROBILINOGEN 0.2 10/26/2013 1721   NITRITE NEGATIVE 12/07/2015 1112   LEUKOCYTESUR NEGATIVE 12/07/2015 1112   Sepsis Labs Invalid input(s): PROCALCITONIN,  WBC,  LACTICIDVEN Microbiology No results found for this or any previous visit  (from the past 240 hour(s)).  Time coordinating discharge: Over 30 minutes  Hazeline Junkeryan Gabor Lusk, MD  [facility] 12/15/2015, 1:34 PM Pager [phone]  If 7PM-7AM, please contact night-coverage [email or web address] Password [email or web address]

## 2015-12-15 NOTE — Progress Notes (Addendum)
Inpatient Rehabilitation  I reviewed the case with rehab MD, who agreed to admit patient to IP Rehab today; however, upon him visiting her today she refused CIR and stated that she wants to go home.  As a result I have notified RNCM that patient is no longer a candidate for IP Rehab.  Will sign off.  Charlane FerrettiMelissa Donavyn Fecher, M.A., CCC/SLP Admission Coordinator  [facility]  Cell [phone]

## 2015-12-15 NOTE — Progress Notes (Signed)
Patient ID: [name], female   DOB: 03/26/1975, 40 y.o.   MRN: [id number]  [facility] Progress Note   Assessment/ Plan:   1. 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 With good response to ongoing diuretic therapy and gradual but slow decline of creatinine noted. 2. Exacerbation of diastolic heart failure: Ongoing efforts at optimization of diuretic therapy in order to try and alleviate features of volume overload. Symptomatically appears to be improving slowly.  3. Anemia: Consistent with anemia of chronic disease, status post initiation of Aranesp therapy. Iron stores appear replete.  4. Hypertension: Blood pressures appear to be improving in control on the current regimen of amlodipine, carvedilol and isosorbide. 5. Proximal ulcer of the right leg-stage II lesion: Ongoing local wound care in this nonambulatory/bedbound patient.  Subjective:   Reports to be feeling better and tells me that she stood up twice yesterday with assistance. Inquires about going home.    Objective:   BP (!) 153/70 (BP Location: Left Wrist)   Pulse 81   Temp 99 F (37.2 C) (Oral)   Resp 18   Ht 5\' 5"  (1.651 m)   Wt (!) 173.7 kg (383 lb)   SpO2 99%   BMI 63.73 kg/m   Intake/Output Summary (Last 24 hours) at 12/15/15 0853 Last data filed at 12/15/15 0400  Gross per 24 hour  Intake             1068 ml  Output              200 ml  Net              868 ml   Weight change:   Physical Exam: NWG:NFAOZHYGen:Appears to be comfortable resting in bed, Eating breakfast CVS: Pulse regular rhythm, S1 and S2 normal without any gallop Resp: Distant breath sounds bilaterally-anterior chest Abd: Soft, obese, nontender Ext: 2+ left lower extremity edema, 1-2+ right lower extremity edema with clean  gauze dressing.  Imaging: No results found.  Labs: BMET  Recent Labs Lab 12/09/15 0221 12/10/15 0721 12/11/15 0914 12/12/15 1131 12/13/15 0323 12/14/15 0402 12/15/15 0226  NA 138 137 138 138 139 138 139  K 4.3 4.1 4.0 3.9 3.7 3.9 3.7  CL 112* 113* 112* 113* 111 111 112*  CO2 17* 16* 18* 18* 18* 18* 19*  GLUCOSE 127* 115* 115* 120* 123* 93 112*  BUN 56* 59* 60* 61* 57* 56* 57*  CREATININE 5.37* 5.30* 5.34* 5.36* 5.28* 4.89* 4.91*  CALCIUM 8.4* 8.3* 8.4* 8.1* 8.3* 8.2* 8.5*  PHOS  --   --   --   --   --  5.1* 5.0*   CBC  Recent Labs Lab 12/09/15 0221 12/10/15 0721 12/11/15 0914 12/13/15 0323 12/14/15 0402  WBC 10.2 9.1 11.0* 15.4* 17.0*  NEUTROABS 7.9* 6.7 8.0* 12.4*  --   HGB 6.8* 7.5* 7.8* 7.8* 7.7*  HCT 20.8* 23.0* 24.4* 24.4* 24.3*  MCV 83.5 83.0 87.1 84.7 85.6  PLT 302 328 412* 479* 460*    Medications:    . amLODipine  10 mg Oral Daily  . carvedilol  25 mg Oral BID WC  . darbepoetin (ARANESP) injection - NON-DIALYSIS  60 mcg Subcutaneous Q Sat-1800  . feeding supplement (NEPRO CARB STEADY)  237 mL Oral BID BM  . heparin  5,000 Units Subcutaneous Q8H  .  insulin aspart  0-20 Units Subcutaneous TID WC  . isosorbide mononitrate  30 mg Oral Daily  . pantoprazole  40 mg Oral Daily  . torsemide  60 mg Oral BID   Zetta BillsJay Osher Oettinger, MD 12/15/2015, 8:53 AM

## 2015-12-15 NOTE — Progress Notes (Signed)
PROGRESS NOTE    Linda DykesDavenia McKinnon [name]  WUJ:[id number] DOB: 01/19/1976 DOA: 12/07/2015 PCP: Dorrene GermanEdwin A Avbuere, MD   Brief Narrative:  40 yo female with CKD stage IV (baseline Cr 2.5), chronic diastolic CHF, uncontrolled DM, and morbid obesity presented 10/26 with generalized weakness, diarrhea and decreased appetite, found to be severely edematous and weak. Creatinine was elevated in the setting of anasarca, which has improved very slowly with diuresis. Nephrology has been following and diuretic therapy has been transitioned to oral torsemide. She is medically stable for discharge and disposition to inpatient rehabilitation is planned.  Assessment & Plan:   Principal Problem:   Dyspnea Active Problems:   Thigh abscess   AKI (acute kidney injury) (HCC)   Anasarca   Asthma   Essential hypertension   CHF (congestive heart failure) (HCC)   Acute on chronic systolic congestive heart failure (HCC)   Venous stasis ulcer of right calf without varicose veins (HCC)   History of CVA with residual deficit   Super-super obese (HCC)   Acute kidney injury superimposed on chronic kidney disease (HCC)   Anemia of chronic disease   Lymphedema   Uncontrolled type 2 diabetes mellitus with complication, with long-term current use of insulin (HCC)   Lymphocytosis   Debility  Acute exacerbation of chronic diastolic CHF: 2016 echo with EF 55-60%, G1DD, mild-mod LVH. Symptomatically improving slowly.  - Continue po diuresis and will need creatinine monitoring.  - Daily weights inaccurate; continue strict I/O - No longer with IV diuresis, D/C foley.  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 Cr elevation somewhat expected given habitus. UOP has picked up slowly. Persistent NAGMA without indications for HD. - Continue torsemide 60 mg bid. - Follow nephrology recommendations: Dr. Allena KatzPatel contacted and agrees with discharge to CIR, will be able to see her over the weekend  there.  - Monitoring renal function panel  HTN: Chronic, stable.  - Continue amlodipine, coreg and isosorbide. Aggressive diuresis.  Asthma: Chronic, stable. No exacerbation. Dyspnea likely additionally worsened by OHS.  - Continue Bronchodilator therapy with duonebs.  T2DM: Last HbA1c 7.8% in Dec 2016. - CBG and SSI qAC/HS. At inpatient goal   Morbid obesity: Root cause of many conditions including deconditioning - DVT ppx - PT eval, recommending CIR.   Right leg, proximal, ulcerated lesion stage 2: Patient is not ambulatory, bed bound, no evidence of infection. - Continue local wound care.   Anemia of chronic disease. Hb has been stable 7.8. Transferrin saturation 23 with ferritin 943.  - Will follow cell count.  - Continue aranesp, presuming this is cause of leukocytosis in absence of source of infection.   Dyspepsia and reflux.  - Tolerating po well, continue antiacid therapy.   Subjective: Patient feeling better again today, frustrated with LOS. She's been more motivated to be more active. Denies dyspnea, chest pain.  Objective: Vitals:   12/14/15 0740 12/14/15 2036 12/15/15 0535 12/15/15 0857  BP: (!) 151/66 (!) 151/72 (!) 153/70 (!) 150/65  Pulse: 80 83 81 81  Resp: 18 19 18    Temp: 98.6 F (37 C) 98.4 F (36.9 C) 99 F (37.2 C) 98.6 F (37 C)  TempSrc: Oral Oral Oral Oral  SpO2: 99% 100% 99% 100%  Weight:      Height:        Intake/Output Summary (Last 24 hours) at 12/15/15 1057 Last data filed at 12/15/15 0859  Gross per 24 hour  Intake  895 ml  Output                0 ml  Net              895 ml   Filed Weights   12/12/15 0458 12/13/15 0641 12/14/15 0612  Weight: (!) 175.5 kg (387 lb) (!) 174.6 kg (385 lb) (!) 173.7 kg (383 lb)    Examination:  General exam: Morbidly obese 40 y.o. female in no distress E ENT: mild conjunctival pallor, oral mucosa moist. Respiratory system: Nonlabored. Globally distant breath sound anteriorly.  Mild decreased breath sounds at the dependent zones, no wheezing, rales or rhonchi.   Cardiovascular system: S1 & S2 heard, RRR. No JVD, murmurs, rubs, gallops or clicks. Generalized 1+ pitting edema with 3+ bilateral LE edema. Gastrointestinal system: Abdomen is obese, nondistended, soft and nontender. No organomegaly or masses felt. Normal bowel sounds heard. Central nervous system: Alert and oriented. No focal neurological deficits but diffusely deconditioned. Extremities: No fedormities Skin: No rashes, lesions or ulcers   Data Reviewed: I have personally reviewed following labs and imaging studies  CBC:  Recent Labs Lab 12/09/15 0221 12/10/15 0721 12/11/15 0914 12/13/15 0323 12/14/15 0402  WBC 10.2 9.1 11.0* 15.4* 17.0*  NEUTROABS 7.9* 6.7 8.0* 12.4*  --   HGB 6.8* 7.5* 7.8* 7.8* 7.7*  HCT 20.8* 23.0* 24.4* 24.4* 24.3*  MCV 83.5 83.0 87.1 84.7 85.6  PLT 302 328 412* 479* 460*   Basic Metabolic Panel:  Recent Labs Lab 12/11/15 0914 12/12/15 1131 12/13/15 0323 12/14/15 0402 12/15/15 0226  NA 138 138 139 138 139  K 4.0 3.9 3.7 3.9 3.7  CL 112* 113* 111 111 112*  CO2 18* 18* 18* 18* 19*  GLUCOSE 115* 120* 123* 93 112*  BUN 60* 61* 57* 56* 57*  CREATININE 5.34* 5.36* 5.28* 4.89* 4.91*  CALCIUM 8.4* 8.1* 8.3* 8.2* 8.5*  PHOS  --   --   --  5.1* 5.0*   GFR: Estimated Creatinine Clearance: 25.2 mL/min (by C-G formula based on SCr of 4.91 mg/dL (H)). Liver Function Tests:  Recent Labs Lab 12/14/15 0402 12/15/15 0226  ALBUMIN 1.9* 1.8*   No results for input(s): LIPASE, AMYLASE in the last 168 hours. No results for input(s): AMMONIA in the last 168 hours. Coagulation Profile: No results for input(s): INR, PROTIME in the last 168 hours. Cardiac Enzymes: No results for input(s): CKTOTAL, CKMB, CKMBINDEX, TROPONINI in the last 168 hours. BNP (last 3 results) No results for input(s): PROBNP in the last 8760 hours. HbA1C: No results for input(s): HGBA1C in the  last 72 hours. CBG:  Recent Labs Lab 12/14/15 0611 12/14/15 1206 12/14/15 1710 12/14/15 2119 12/15/15 0611  GLUCAP 102* 105* 111* 130* 101*   Lipid Profile: No results for input(s): CHOL, HDL, LDLCALC, TRIG, CHOLHDL, LDLDIRECT in the last 72 hours. Thyroid Function Tests: No results for input(s): TSH, T4TOTAL, FREET4, T3FREE, THYROIDAB in the last 72 hours. Anemia Panel: No results for input(s): VITAMINB12, FOLATE, FERRITIN, TIBC, IRON, RETICCTPCT in the last 72 hours. Sepsis Labs: No results for input(s): PROCALCITON, LATICACIDVEN in the last 168 hours. No results found for this or any previous visit (from the past 240 hour(s)).   Radiology Studies: No results found.  Scheduled Meds: . amLODipine  10 mg Oral Daily  . carvedilol  25 mg Oral BID WC  . darbepoetin (ARANESP) injection - NON-DIALYSIS  60 mcg Subcutaneous Q Sat-1800  . feeding supplement (NEPRO CARB STEADY)  237 mL Oral  BID BM  . heparin  5,000 Units Subcutaneous Q8H  . insulin aspart  0-20 Units Subcutaneous TID WC  . isosorbide mononitrate  30 mg Oral Daily  . pantoprazole  40 mg Oral Daily  . torsemide  60 mg Oral BID   Continuous Infusions:    LOS: 7 days   Hazeline Junker, MD [facility] Pager [phone]  If 7PM-7AM, please contact night-coverage [email or web address] Password [email or web address] 12/15/2015, 10:57 AM

## 2015-12-15 NOTE — Care Management Note (Signed)
Case Management Note  Patient Details  Name: [name] MRN: [id number] Date of Birth: September 25, 1975  Subjective/Objective:                    Action/Plan: Pt refused to go to CIR. Pt asked to be discharged home. CM met with the patient and she states her Medicaid has lapsed. Pt with orders for [facility] services. CM spoke to [place] with [facility] and they were following her at home for [facility] RN prior to hospitalization but pt had Medicaid at the time. Butch Penny to see if pt qualifies for charity services until the patient gets her Medicaid restarted. Pt discharging with one new medication. CM provided her with a coupon for the demadex. Pt states she can afford the $20.33 for the demadex with the coupon. Pt states she does not have transportation home. Pt asking to have ambulance transport. CM spoke to the bedside RN and she is ready for [facility] to be called. CM called and arranged for ambulance transportation.   Expected Discharge Date:                  Expected Discharge Plan:  [place]  In-House Referral:     Discharge planning Services  CM Consult  Post Acute Care Choice:  Home Health Choice offered to:  Patient  DME Arranged:    DME Agency:     HH Arranged:  RN [place] Agency:  [place]  Status of Service:  Completed, signed off  If discussed at [place] of Stay Meetings, dates discussed:    Additional Comments:  Pollie Friar, RN 12/15/2015, 2:39 PM

## 2016-01-28 ENCOUNTER — Emergency Department (HOSPITAL_COMMUNITY)
Admission: EM | Admit: 2016-01-28 | Discharge: 2016-01-28 | Disposition: A | Discharge status: Home / Self Care | Payer: Medicaid Other | Attending: Emergency Medicine | Admitting: Emergency Medicine

## 2016-01-28 DIAGNOSIS — I110 Hypertensive heart disease with heart failure: Secondary | ICD-10-CM | POA: Diagnosis not present

## 2016-01-28 DIAGNOSIS — L97121 Non-pressure chronic ulcer of left thigh limited to breakdown of skin: Secondary | ICD-10-CM | POA: Insufficient documentation

## 2016-01-28 DIAGNOSIS — Z794 Long term (current) use of insulin: Secondary | ICD-10-CM | POA: Insufficient documentation

## 2016-01-28 DIAGNOSIS — I5023 Acute on chronic systolic (congestive) heart failure: Secondary | ICD-10-CM | POA: Insufficient documentation

## 2016-01-28 DIAGNOSIS — Z79899 Other long term (current) drug therapy: Secondary | ICD-10-CM | POA: Insufficient documentation

## 2016-01-28 DIAGNOSIS — E119 Type 2 diabetes mellitus without complications: Secondary | ICD-10-CM | POA: Insufficient documentation

## 2016-01-28 DIAGNOSIS — J45909 Unspecified asthma, uncomplicated: Secondary | ICD-10-CM | POA: Insufficient documentation

## 2016-01-28 DIAGNOSIS — M6281 Muscle weakness (generalized): Secondary | ICD-10-CM | POA: Diagnosis present

## 2016-01-28 DIAGNOSIS — Z8673 Personal history of transient ischemic attack (TIA), and cerebral infarction without residual deficits: Secondary | ICD-10-CM | POA: Diagnosis not present

## 2016-01-28 LAB — CBC WITH DIFFERENTIAL/PLATELET
Basophils Absolute: 0 10*3/uL (ref 0.0–0.1)
Basophils Relative: 0 %
Eosinophils Absolute: 0.3 10*3/uL (ref 0.0–0.7)
Eosinophils Relative: 4 %
HCT: 27 % — ABNORMAL LOW (ref 36.0–46.0)
Hemoglobin: 8.2 g/dL — ABNORMAL LOW (ref 12.0–15.0)
Lymphocytes Relative: 25 %
Lymphs Abs: 1.7 10*3/uL (ref 0.7–4.0)
MCH: 26.1 pg (ref 26.0–34.0)
MCHC: 30.4 g/dL (ref 30.0–36.0)
MCV: 86 fL (ref 78.0–100.0)
Monocytes Absolute: 0.7 10*3/uL (ref 0.1–1.0)
Monocytes Relative: 10 %
Neutro Abs: 4.3 10*3/uL (ref 1.7–7.7)
Neutrophils Relative %: 61 %
Platelets: 329 10*3/uL (ref 150–400)
RBC: 3.14 MIL/uL — ABNORMAL LOW (ref 3.87–5.11)
RDW: 15 % (ref 11.5–15.5)
WBC: 6.9 10*3/uL (ref 4.0–10.5)

## 2016-01-28 LAB — COMPREHENSIVE METABOLIC PANEL
ALT: 8 U/L — ABNORMAL LOW (ref 14–54)
AST: 10 U/L — ABNORMAL LOW (ref 15–41)
Albumin: 2.1 g/dL — ABNORMAL LOW (ref 3.5–5.0)
Alkaline Phosphatase: 43 U/L (ref 38–126)
Anion gap: 8 (ref 5–15)
BUN: 25 mg/dL — ABNORMAL HIGH (ref 6–20)
CO2: 25 mmol/L (ref 22–32)
Calcium: 8.7 mg/dL — ABNORMAL LOW (ref 8.9–10.3)
Chloride: 106 mmol/L (ref 101–111)
Creatinine, Ser: 3.56 mg/dL — ABNORMAL HIGH (ref 0.44–1.00)
GFR calc Af Amer: 17 mL/min — ABNORMAL LOW (ref >60)
GFR calc non Af Amer: 15 mL/min — ABNORMAL LOW (ref >60)
Glucose, Bld: 82 mg/dL (ref 65–99)
Potassium: 3.3 mmol/L — ABNORMAL LOW (ref 3.5–5.1)
Sodium: 139 mmol/L (ref 135–145)
Total Bilirubin: 0.7 mg/dL (ref 0.3–1.2)
Total Protein: 7.3 g/dL (ref 6.5–8.1)

## 2016-01-28 LAB — I-STAT CG4 LACTIC ACID, ED: Lactic Acid, Venous: 0.83 mmol/L (ref 0.5–1.9)

## 2016-01-28 MED ORDER — TORSEMIDE 20 MG PO TABS: 60.0000 mg | ORAL_TABLET | Freq: Two times a day (BID) | ORAL | 0 refills | Status: DC

## 2016-01-28 MED ORDER — FERROUS SULFATE 325 (65 FE) MG PO TABS: 325.0000 mg | ORAL_TABLET | Freq: Three times a day (TID) | ORAL | 3 refills | Status: DC

## 2016-01-28 MED ORDER — CARVEDILOL 25 MG PO TABS: 25.0000 mg | ORAL_TABLET | Freq: Two times a day (BID) | ORAL | 1 refills | Status: AC

## 2016-01-28 MED ORDER — HYDRALAZINE HCL 50 MG PO TABS: 50.0000 mg | ORAL_TABLET | Freq: Three times a day (TID) | ORAL | 0 refills | Status: DC

## 2016-01-28 MED ORDER — ONDANSETRON HCL 4 MG/2ML IJ SOLN
4.0000 mg | Freq: Once | INTRAMUSCULAR | Status: AC
  Administered 2016-01-28: 4 mg via INTRAVENOUS
  Filled 2016-01-28: qty 2

## 2016-01-28 MED ORDER — INSULIN ASPART 100 UNIT/ML ~~LOC~~ SOLN: 0.0000 [IU] | Freq: Three times a day (TID) | SUBCUTANEOUS | 0 refills | Status: AC

## 2016-01-28 MED ORDER — ISOSORBIDE MONONITRATE ER 30 MG PO TB24: 30.0000 mg | ORAL_TABLET | Freq: Every day | ORAL | 3 refills | Status: DC

## 2016-01-28 MED ORDER — INSULIN GLARGINE 100 UNIT/ML ~~LOC~~ SOLN: 15.0000 [IU] | Freq: Every evening | SUBCUTANEOUS | 0 refills | Status: DC | PRN

## 2016-01-28 MED ORDER — AMLODIPINE BESYLATE 10 MG PO TABS: 10.0000 mg | ORAL_TABLET | Freq: Every day | ORAL | 0 refills | Status: DC

## 2016-01-28 MED ORDER — DOXYCYCLINE HYCLATE 100 MG PO CAPS: 100.0000 mg | ORAL_CAPSULE | Freq: Two times a day (BID) | ORAL | 0 refills | Status: DC

## 2016-01-28 MED ORDER — POTASSIUM CHLORIDE CRYS ER 20 MEQ PO TBCR: 40.0000 meq | EXTENDED_RELEASE_TABLET | Freq: Every day | ORAL | 0 refills | Status: DC

## 2016-01-28 MED ORDER — AMLODIPINE BESYLATE 5 MG PO TABS
10.0000 mg | ORAL_TABLET | Freq: Once | ORAL | Status: AC
  Administered 2016-01-28: 10 mg via ORAL
  Filled 2016-01-28: qty 2

## 2016-01-28 MED ORDER — HYDRALAZINE HCL 25 MG PO TABS
50.0000 mg | ORAL_TABLET | Freq: Once | ORAL | Status: AC
  Administered 2016-01-28: 50 mg via ORAL
  Filled 2016-01-28: qty 2

## 2016-01-28 MED ORDER — CARVEDILOL 12.5 MG PO TABS
25.0000 mg | ORAL_TABLET | Freq: Two times a day (BID) | ORAL | Status: DC
  Administered 2016-01-28: 25 mg via ORAL
  Filled 2016-01-28: qty 2

## 2016-01-28 NOTE — Clinical Social Work Note (Signed)
Clinical Social Work Assessment  Patient Details  Name: Linda DykesDavenia McKinnon [name] MRN: [id number] Date of Birth: 11/22/1975  Date of referral:  01/28/16               Reason for consult:  Housing Concerns/Homelessness                Permission sought to share information with:  Family Supports Permission granted to share information::  Yes, Verbal Permission Granted  Name::        Agency::     Relationship::     Contact Information:     Housing/Transportation Living arrangements for the past 2 months:  Single Family Home Source of Information:  Patient Patient Interpreter Needed:  None Criminal Activity/Legal Involvement Pertinent to Current Situation/Hospitalization:  No - Comment as needed Significant Relationships:  Significant Other Lives with:  Significant Other Do you feel safe going back to the place where you live?  Yes Need for family participation in patient care:  No (Coment)  Care giving concerns: Pt is bed bound and unable to care for herself independently.   Social Worker assessment / plan:  CSW received consult for possible facility placement. Pt is 40 yo AA female who presents to the ED because of lack of ability to care for herself. Pt is bed bound and seeking services to increase her independence. CSW engaged with pt at pt's bedside. CSW introduced herself and her role as a Child psychotherapistsocial worker. Pt explained that she would like top receive home health services instead of going to a facility. CSW explained that Medicaid would not cover a SNF stay unless it was long term care anyway. Pt declines the option of long term care at this time and wishes to move forward with home health services. CSW notified RNCM. No further social work needs at this time. CSW signing off.  Employment status:  Unemployed (Bed bound) Insurance information:  [place] Recommend[place]  Not assessed at this time Information / Referral to community resources:  Other (Comment Required) (Home  health )  Patient/Family's Response to care: Pt will d/c home with home health services.  Patient/Family's Understanding of and Emotional Response to Diagnosis, Current Treatment, and Prognosis: Pt is appreciative of the care provided by CSW at this time.  Emotional Assessment Appearance:  Appears stated age Attitude/Demeanor/Rapport:    Affect (typically observed):  Other (Cooperative) Orientation:  Oriented to Self, Oriented to Place, Oriented to  Time, Oriented to Situation Alcohol / Substance use:  Not Applicable Psych involvement (Current and /or in the community):  No (Comment)  Discharge Needs  Concerns to be addressed:  Care Coordination, Home Safety Concerns Readmission within the last 30 days:  No Current discharge risk:  Dependent with Mobility Barriers to Discharge:  No Barriers Identified   Jonathon JordanLynn B Dailon Sheeran, LCSWA 01/28/2016, 4:17 PM

## 2016-01-28 NOTE — Care Management Note (Addendum)
Case Management Note  Patient Details  Name: [name] MRN: [id number] Date of Birth: 12/22/1975  Subjective/Objective: CM [place] that pt was bed bound, weighs > 380 lbs  and had been seen in the past and referred for CIR placement but had refused at that time. Returns now with skin issues and mobility issues. Will refer to CSW for assist. After interview of pt realize pt lives with Spouse and Son. Has Walker and W/C which she is able and willing to use. Would like [facility] but unable to pay out of pocket. [facility] process. We talked about baby steps 1) wt loss and mobility while skin issues being [facility] and assist of HH aide and HHSW. Pt disappointed about loss of previous opportunity but hopeful about current new opportunity.                     Action/Plan: Vaughan BastaJermaine, Rep with [facility] has been [facility] and [facility] for this pt who has had decline since June of this yr.   Expected Discharge Date:                  Expected Discharge Plan:  Skilled Nursing Facility  In-House Referral:  Clinical Social Work  Discharge planning Services  CM Consult  Post Acute Care Choice:  NA Choice offered to:     DME Arranged:    DME Agency:     HH Arranged:    HH Agency:     Status of Service:  In process, will continue to follow  If discussed at Long Length of Stay Meetings, dates discussed:    Additional Comments:  [name], Linda Mesler M, RN 01/28/2016, 3:14 PM

## 2016-01-28 NOTE — Discharge Instructions (Signed)
Please take your daily medications as directed. Please call your primary care provider tomorrow morning and inform him of today's visit and all relevant data and request follow-up evaluation this week. Please read attached information, return to the emergency room immediately if you experience any new or worsening signs or symptoms.

## 2016-01-28 NOTE — ED Provider Notes (Signed)
[facility]-EMERGENCY DEPT Provider Note   CSN: [id number] Arrival date & time: 01/28/16  1104   History   Chief Complaint Chief Complaint  Patient presents with  . Weakness  . leg wound    HPI [name] is a 40 y.o. female.  HPI   40 year old female with a history of morbid obesity, CHF, hypertension, diabetes, asthma presents today with complaints of weakness. Patient notes that over the last 3 weeks she's been nauseous has had a decreased appetite and has been throwing up with food. She notes one week ago she developed a sore to her right upper thigh that has continued to worsen. Patient denies any upper respiratory complaints, chest pain or shortness of breath, abdominal pain, changes in urine color, clarity or characteristics. Patient notes that she has numerous chronic health conditions but has not been taking her medication as she has been nauseous.    Patient was recently discharged from the hospital service on 12/15/2015 after an episode of shortness of breath she was noted to have decreased appetite, was severely and is edematous, and weak. Creatinine was elevated in the setting anasarca. She was transitioned to oral furosemide. Patient was recommended to be discharged to inpatient rehabilitation, but she refused and wanted to go home. Patient notes that she has not been out of her bed recently due to lower extremity edema.  Pt also has a history of chronic legs wounds, most recent right calf.     Past Medical History:  Diagnosis Date  . Asthma   . CHF (congestive heart failure) (HCC)    diastolic  . CVA (cerebral infarction) 1990's   "writing is not the same since" R leg weakness   . GERD (gastroesophageal reflux disease)   . Hypertension   . Morbid obesity (HCC)   . Stroke (HCC)   . Type II diabetes mellitus [facility](HCC)     Patient Active Problem List   Diagnosis Date Noted  . Acute on chronic systolic congestive heart failure (HCC)   . Venous stasis ulcer  of right calf without varicose veins (HCC)   . History of CVA with residual deficit   . Super-super obese (HCC)   . Acute kidney injury superimposed on chronic kidney disease (HCC)   . Anemia of chronic disease   . Lymphedema   . Uncontrolled type 2 diabetes mellitus with complication, with long-term current use of insulin (HCC)   . Lymphocytosis   . Debility   . CHF (congestive heart failure) (HCC) 12/08/2015  . CHF exacerbation (HCC) 12/07/2015  . Dyspnea 12/07/2015  . Type 2 diabetes, HbA1c goal < 7% (HCC)   . Acute respiratory failure (HCC)   . E-coli UTI 02/23/2015  . Leukocytosis   . Cellulitis 02/19/2015  . History of stroke 02/19/2015  . Asthma exacerbation 02/19/2015  . Acute diastolic (congestive) heart failure 02/19/2015  . Cellulitis of left lower extremity   . Essential hypertension   . Cellulitis of right lower extremity   . Chronic kidney disease (CKD), stage IV (severe) (HCC) 02/10/2015  . Aortic valve mass   . Elevated brain natriuretic peptide (BNP) level 02/05/2015  . Anasarca 02/05/2015  . Acute CHF (congestive heart failure) (HCC) 02/05/2015  . Asthma 02/05/2015  . Hypoalbuminemia 02/05/2015  . Anemia due to other cause 02/05/2015  . Morbid obesity (HCC) 02/05/2015  . Bleeding from wound 09/16/2013  . Hypertriglyceridemia 09/16/2013  . Diarrhea 09/16/2013  . Cerebral embolism with cerebral infarction (HCC) 09/04/2013  . Thigh abscess 08/30/2013  .  Sepsis (HCC) 08/30/2013  . Diabetes mellitus with hyperglycemia (HCC) 08/30/2013  . AKI (acute kidney injury) (HCC) 08/30/2013  . Acute respiratory failure with hypoxia (HCC) 08/30/2013    Past Surgical History:  Procedure Laterality Date  . INCISION AND DRAINAGE OF WOUND Left 08/30/2013   thigh necrotic wound/notes 08/30/2013  . IRRIGATION AND DEBRIDEMENT ABSCESS Left 08/30/2013   Procedure: IRRIGATION AND DEBRIDEMENT ABSCESS Left Thigh Necrotic Wound;  Surgeon: [name], MD;  Location: [facility] OR;   Service: General;  Laterality: Left;  . TEE WITHOUT CARDIOVERSION N/A 09/07/2013   Procedure: TRANSESOPHAGEAL ECHOCARDIOGRAM (TEE);  Surgeon: [name], MD;  Location[facility] ENDOSCOPY;  Service: Cardiovascular;  Laterality: N/A;  . TEE WITHOUT CARDIOVERSION N/A 02/08/2015   Procedure: TRANSESOPHAGEAL ECHOCARDIOGRAM (TEE);  Surgeon: Chilton Si, MD;  Location: [facility] ENDOSCOPY;  Service: Cardiovascular;  Laterality: N/A;    OB History    Gravida Para Term Preterm AB Living   3 3 3  0 0 3   SAB TAB Ectopic Multiple Live Births   0 0 0 0 3       Home Medications    Prior to Admission medications   Medication Sig Start Date End Date Taking? Authorizing Provider  acetaminophen (TYLENOL) 500 MG tablet Take 1,000 mg by mouth every 6 (six) hours as needed for fever (pain).     Historical Provider, MD  albuterol (PROAIR HFA) 108 (90 Base) MCG/ACT inhaler Inhale 2 puffs into the lungs every 6 (six) hours as needed for wheezing or shortness of breath. 08/12/15   Charm Rings, MD  albuterol (PROVENTIL) (2.5 MG/3ML) 0.083% nebulizer solution Take 3 mLs (2.5 mg total) by nebulization every 6 (six) hours as needed for wheezing or shortness of breath. 08/12/15   Charm Rings, MD  amLODipine (NORVASC) 10 MG tablet Take 1 tablet (10 mg total) by mouth daily. 01/28/16   Eyvonne Mechanic, PA-C  Ascorbic Acid (VITAMIN C) 1000 MG tablet Take 1,000 mg by mouth daily.    Historical Provider, MD  carvedilol (COREG) 25 MG tablet Take 1 tablet (25 mg total) by mouth 2 (two) times daily with a meal. 01/28/16   Eyvonne Mechanic, PA-C  doxycycline (VIBRAMYCIN) 100 MG capsule Take 1 capsule (100 mg total) by mouth 2 (two) times daily. 01/28/16   Eyvonne Mechanic, PA-C  ferrous sulfate 325 (65 FE) MG tablet Take 1 tablet (325 mg total) by mouth 3 (three) times daily with meals. 01/28/16   Eyvonne Mechanic, PA-C  hydrALAZINE (APRESOLINE) 50 MG tablet Take 1 tablet (50 mg total) by mouth 3 (three) times daily. 01/28/16   Eyvonne Mechanic, PA-C  insulin aspart (NOVOLOG) 100 UNIT/ML injection Inject 0-20 Units into the skin 3 (three) times daily with meals. CBG < 70: implement hypoglycemia protocol CBG 70 - 120: 0 units CBG 121 - 150: 3 units CBG 151 - 200: 4 units CBG 201 - 250: 7 units CBG 251 - 300: 11 units CBG 301 - 350: 15 units CBG 351 - 400: 20 units 01/28/16   Kynslie Ringle, PA-C  insulin glargine (LANTUS) 100 UNIT/ML injection Inject 0.15 mLs (15 Units total) into the skin at bedtime as needed (CBG >140). 01/28/16   Eyvonne Mechanic, PA-C  isosorbide mononitrate (IMDUR) 30 MG 24 hr tablet Take 1 tablet (30 mg total) by mouth daily. 01/28/16   Eyvonne Mechanic, PA-C  potassium chloride SA (K-DUR,KLOR-CON) 20 MEQ tablet Take 2 tablets (40 mEq total) by mouth daily. 01/28/16   Eyvonne Mechanic, PA-C  torsemide [name])  20 MG tablet Take 3 tablets (60 mg total) by mouth 2 (two) times daily. 01/28/16   Eyvonne MechanicJeffrey Maikel Neisler, PA-C  vitamin A 8000 UNIT capsule Take 8,000 Units by mouth daily.    Historical Provider, MD  Zinc 50 MG TABS Take 50 mg by mouth daily.    Historical Provider, MD    Family History Family History  Problem Relation Age of Onset  . Diabetes Mother   . Kidney disease Mother     Social History Social History  Substance Use Topics  . Smoking status: Never Smoker  . Smokeless tobacco: Never Used  . Alcohol use No     Allergies   Azithromycin   Review of Systems Review of Systems  All other systems reviewed and are negative.    Physical Exam Updated Vital Signs BP (!) 157/134   Pulse 89   SpO2 97%   Physical Exam  Constitutional: She is oriented to person, place, and time. She appears well-developed and well-nourished.  HENT:  Head: Normocephalic and atraumatic.  Eyes: Conjunctivae are normal. Pupils are equal, round, and reactive to light. Right eye exhibits no discharge. Left eye exhibits no discharge. No scleral icterus.  Neck: Normal range of motion. No JVD present. No tracheal  deviation present.  Cardiovascular: Normal rate.   Pulmonary/Chest: Effort normal. No stridor. No respiratory distress. She has no wheezes.  Abdominal: Soft. She exhibits no distension. There is no tenderness.  Musculoskeletal:  Bilateral lower extremity chronic skin changes   Neurological: She is alert and oriented to person, place, and time. Coordination normal.  Skin:  Large ulceration right medial thigh, surrounding chronic soft tissue changes  Psychiatric: She has a normal mood and affect. Her behavior is normal. Judgment and thought content normal.  Nursing note and vitals reviewed.    ED Treatments / Results  Labs (all labs ordered are listed, but only abnormal results are displayed) Labs Reviewed  CBC WITH DIFFERENTIAL/PLATELET - Abnormal; Notable for the following:       Result Value   RBC 3.14 (*)    Hemoglobin 8.2 (*)    HCT 27.0 (*)    All other components within normal limits  COMPREHENSIVE METABOLIC PANEL - Abnormal; Notable for the following:    Potassium 3.3 (*)    BUN 25 (*)    Creatinine, Ser 3.56 (*)    Calcium 8.7 (*)    Albumin 2.1 (*)    AST 10 (*)    ALT 8 (*)    GFR calc non Af Amer 15 (*)    GFR calc Af Amer 17 (*)    All other components within normal limits  I-STAT CG4 LACTIC ACID, ED  I-STAT CG4 LACTIC ACID, ED    EKG  EKG Interpretation None       Radiology No results found.  Procedures Procedures (including critical care time)  Medications Ordered in ED Medications  amLODipine (NORVASC) tablet 10 mg (not administered)  carvedilol (COREG) tablet 25 mg (not administered)  hydrALAZINE (APRESOLINE) tablet 50 mg (not administered)  ondansetron (ZOFRAN) injection 4 mg (4 mg Intravenous Given 01/28/16 1309)     Initial Impression / Assessment and Plan / ED Course  I have reviewed the triage vital signs and the nursing notes.  Pertinent labs & imaging results that were available during my care of the patient were reviewed by me and  considered in my medical decision making (see chart for details).  Clinical Course    Labs:I-STAT lactic acid, CBC, CMP-  creatinine 3.56, BUN 25 improved from previous hospital stay. consistent with baseline  Imaging:  Consults: Child psychotherapist, case management  Therapeutics:  Discharge Meds:   Assessment/Plan:  19 YOF presents today with complaints of lower extremity wound. Pt is morbidly obese and bed bound. She also complains of Generalized weakness. Patient is afebrile and nontoxic, she denies any upper respiratory complaints or any infectious etiology. Patient's weakness is likely due to deconditioning from being bedbound. She has a chronic appearing wound to her right thigh with no signs of significant surrounding infection. Patient is requesting home Healthcare Services and does not want to be placed into inpatient rehabilitation facility. Patient's labs appear improved from previous at last hospital discharge. She has no signs of severe infection and does not meet criteria for hospital admission. Case management and social work consulted who spoke with the patient. Pt would like to go home with home health services. Pt will be discharged home with home health services , wound care, and PCP follow-up for ongoing management of chronic illness. Patient reports she does not have her daily medications as her prescriptions have run out. She will be given a prescription of all updated medications that were given at her last discharge one month ago. Pt agreed to todays plans and had no questions or concerns at the time of discharge.       Final Clinical Impressions(s) / ED Diagnoses   Final diagnoses:  Ulcer of thigh, left, limited to breakdown of skin (HCC)    New Prescriptions New Prescriptions   DOXYCYCLINE (VIBRAMYCIN) 100 MG CAPSULE    Take 1 capsule (100 mg total) by mouth 2 (two) times daily.     Eyvonne Mechanic, PA-C 01/28/16 1647    Rolan Bucco, MD 01/28/16 1700

## 2016-03-08 ENCOUNTER — Observation Stay (HOSPITAL_COMMUNITY): Payer: Medicaid Other

## 2016-03-08 ENCOUNTER — Encounter (HOSPITAL_COMMUNITY): Payer: Self-pay | Admitting: Emergency Medicine

## 2016-03-08 ENCOUNTER — Observation Stay (HOSPITAL_COMMUNITY)
Admission: EM | Admit: 2016-03-08 | Discharge: 2016-03-09 | Disposition: A | Discharge status: Home / Self Care | Payer: Medicaid Other | Attending: Family Medicine | Admitting: Family Medicine

## 2016-03-08 DIAGNOSIS — E1122 Type 2 diabetes mellitus with diabetic chronic kidney disease: Secondary | ICD-10-CM | POA: Diagnosis not present

## 2016-03-08 DIAGNOSIS — X58XXXA Exposure to other specified factors, initial encounter: Secondary | ICD-10-CM | POA: Diagnosis not present

## 2016-03-08 DIAGNOSIS — E1165 Type 2 diabetes mellitus with hyperglycemia: Secondary | ICD-10-CM | POA: Diagnosis not present

## 2016-03-08 DIAGNOSIS — I5042 Chronic combined systolic (congestive) and diastolic (congestive) heart failure: Secondary | ICD-10-CM | POA: Diagnosis not present

## 2016-03-08 DIAGNOSIS — I509 Heart failure, unspecified: Secondary | ICD-10-CM

## 2016-03-08 DIAGNOSIS — N184 Chronic kidney disease, stage 4 (severe): Secondary | ICD-10-CM | POA: Insufficient documentation

## 2016-03-08 DIAGNOSIS — J45909 Unspecified asthma, uncomplicated: Secondary | ICD-10-CM | POA: Diagnosis not present

## 2016-03-08 DIAGNOSIS — N179 Acute kidney failure, unspecified: Secondary | ICD-10-CM | POA: Diagnosis not present

## 2016-03-08 DIAGNOSIS — E785 Hyperlipidemia, unspecified: Secondary | ICD-10-CM | POA: Diagnosis not present

## 2016-03-08 DIAGNOSIS — S71101A Unspecified open wound, right thigh, initial encounter: Secondary | ICD-10-CM | POA: Insufficient documentation

## 2016-03-08 DIAGNOSIS — Z6841 Body Mass Index (BMI) 40.0 and over, adult: Secondary | ICD-10-CM | POA: Diagnosis not present

## 2016-03-08 DIAGNOSIS — Z79899 Other long term (current) drug therapy: Secondary | ICD-10-CM | POA: Insufficient documentation

## 2016-03-08 DIAGNOSIS — Z792 Long term (current) use of antibiotics: Secondary | ICD-10-CM | POA: Insufficient documentation

## 2016-03-08 DIAGNOSIS — D638 Anemia in other chronic diseases classified elsewhere: Secondary | ICD-10-CM | POA: Diagnosis not present

## 2016-03-08 DIAGNOSIS — M7989 Other specified soft tissue disorders: Secondary | ICD-10-CM | POA: Diagnosis not present

## 2016-03-08 DIAGNOSIS — I69351 Hemiplegia and hemiparesis following cerebral infarction affecting right dominant side: Secondary | ICD-10-CM | POA: Insufficient documentation

## 2016-03-08 DIAGNOSIS — D649 Anemia, unspecified: Secondary | ICD-10-CM | POA: Diagnosis not present

## 2016-03-08 DIAGNOSIS — Z794 Long term (current) use of insulin: Secondary | ICD-10-CM | POA: Insufficient documentation

## 2016-03-08 DIAGNOSIS — I130 Hypertensive heart and chronic kidney disease with heart failure and stage 1 through stage 4 chronic kidney disease, or unspecified chronic kidney disease: Secondary | ICD-10-CM | POA: Insufficient documentation

## 2016-03-08 DIAGNOSIS — L02419 Cutaneous abscess of limb, unspecified: Secondary | ICD-10-CM | POA: Diagnosis not present

## 2016-03-08 LAB — CBC WITH DIFFERENTIAL/PLATELET
Basophils Absolute: 0 10*3/uL (ref 0.0–0.1)
Basophils Relative: 0 %
EOS ABS: 0.3 10*3/uL (ref 0.0–0.7)
EOS PCT: 4 %
HCT: 22 % — ABNORMAL LOW (ref 36.0–46.0)
Hemoglobin: 6.7 g/dL — CL (ref 12.0–15.0)
LYMPHS ABS: 2.4 10*3/uL (ref 0.7–4.0)
Lymphocytes Relative: 30 %
MCH: 26.8 pg (ref 26.0–34.0)
MCHC: 30.5 g/dL (ref 30.0–36.0)
MCV: 88 fL (ref 78.0–100.0)
Monocytes Absolute: 0.6 10*3/uL (ref 0.1–1.0)
Monocytes Relative: 7 %
Neutro Abs: 4.6 10*3/uL (ref 1.7–7.7)
Neutrophils Relative %: 59 %
PLATELETS: 329 10*3/uL (ref 150–400)
RBC: 2.5 MIL/uL — AB (ref 3.87–5.11)
RDW: 17.6 % — ABNORMAL HIGH (ref 11.5–15.5)
WBC: 7.8 10*3/uL (ref 4.0–10.5)

## 2016-03-08 LAB — I-STAT CHEM 8, ED
BUN: 49 mg/dL — ABNORMAL HIGH (ref 6–20)
CALCIUM ION: 1.17 mmol/L (ref 1.15–1.40)
Chloride: 108 mmol/L (ref 101–111)
Creatinine, Ser: 5 mg/dL — ABNORMAL HIGH (ref 0.44–1.00)
Glucose, Bld: 124 mg/dL — ABNORMAL HIGH (ref 65–99)
HCT: 21 % — ABNORMAL LOW (ref 36.0–46.0)
HEMOGLOBIN: 7.1 g/dL — AB (ref 12.0–15.0)
Potassium: 4.6 mmol/L (ref 3.5–5.1)
SODIUM: 140 mmol/L (ref 135–145)
TCO2: 21 mmol/L (ref 0–100)

## 2016-03-08 LAB — BASIC METABOLIC PANEL
Anion gap: 9 (ref 5–15)
BUN: 51 mg/dL — AB (ref 6–20)
CO2: 18 mmol/L — ABNORMAL LOW (ref 22–32)
CREATININE: 4.6 mg/dL — AB (ref 0.44–1.00)
Calcium: 8.5 mg/dL — ABNORMAL LOW (ref 8.9–10.3)
Chloride: 110 mmol/L (ref 101–111)
GFR calc Af Amer: 13 mL/min — ABNORMAL LOW (ref >60)
GFR, EST NON AFRICAN AMERICAN: 11 mL/min — AB (ref >60)
Glucose, Bld: 125 mg/dL — ABNORMAL HIGH (ref 65–99)
POTASSIUM: 4.5 mmol/L (ref 3.5–5.1)
SODIUM: 137 mmol/L (ref 135–145)

## 2016-03-08 LAB — GLUCOSE, CAPILLARY
GLUCOSE-CAPILLARY: 99 mg/dL (ref 65–99)
Glucose-Capillary: 120 mg/dL — ABNORMAL HIGH (ref 65–99)

## 2016-03-08 LAB — PREPARE RBC (CROSSMATCH)

## 2016-03-08 MED ORDER — ALBUTEROL SULFATE (2.5 MG/3ML) 0.083% IN NEBU: 2.5000 mg | INHALATION_SOLUTION | Freq: Four times a day (QID) | RESPIRATORY_TRACT | Status: DC | PRN

## 2016-03-08 MED ORDER — ACETAMINOPHEN 650 MG RE SUPP: 650.0000 mg | Freq: Four times a day (QID) | RECTAL | Status: DC | PRN

## 2016-03-08 MED ORDER — DOCUSATE SODIUM 100 MG PO CAPS
100.0000 mg | ORAL_CAPSULE | Freq: Two times a day (BID) | ORAL | Status: DC
  Administered 2016-03-08: 100 mg via ORAL
  Filled 2016-03-08 (×2): qty 1

## 2016-03-08 MED ORDER — SODIUM CHLORIDE 0.9 % IV SOLN
Freq: Once | INTRAVENOUS | Status: AC
  Administered 2016-03-08: 18:00:00 via INTRAVENOUS

## 2016-03-08 MED ORDER — POLYETHYLENE GLYCOL 3350 17 G PO PACK: 17.0000 g | PACK | Freq: Every day | ORAL | Status: DC | PRN

## 2016-03-08 MED ORDER — HEPARIN SODIUM (PORCINE) 5000 UNIT/ML IJ SOLN
5000.0000 [IU] | Freq: Three times a day (TID) | INTRAMUSCULAR | Status: DC
  Administered 2016-03-08 – 2016-03-09 (×3): 5000 [IU] via SUBCUTANEOUS
  Filled 2016-03-08 (×3): qty 1

## 2016-03-08 MED ORDER — CARVEDILOL 12.5 MG PO TABS
12.5000 mg | ORAL_TABLET | Freq: Two times a day (BID) | ORAL | Status: DC
  Administered 2016-03-08 – 2016-03-09 (×2): 12.5 mg via ORAL
  Filled 2016-03-08 (×2): qty 1

## 2016-03-08 MED ORDER — INSULIN ASPART 100 UNIT/ML ~~LOC~~ SOLN: 0.0000 [IU] | Freq: Three times a day (TID) | SUBCUTANEOUS | Status: DC

## 2016-03-08 MED ORDER — INSULIN ASPART 100 UNIT/ML ~~LOC~~ SOLN: 0.0000 [IU] | Freq: Every day | SUBCUTANEOUS | Status: DC

## 2016-03-08 MED ORDER — INSULIN GLARGINE 100 UNIT/ML ~~LOC~~ SOLN
15.0000 [IU] | Freq: Every evening | SUBCUTANEOUS | Status: DC | PRN
  Filled 2016-03-08: qty 0.15

## 2016-03-08 MED ORDER — ATORVASTATIN CALCIUM 20 MG PO TABS
20.0000 mg | ORAL_TABLET | Freq: Every day | ORAL | Status: DC
  Administered 2016-03-08: 20 mg via ORAL
  Filled 2016-03-08: qty 1

## 2016-03-08 MED ORDER — ACETAMINOPHEN 325 MG PO TABS: 650.0000 mg | ORAL_TABLET | Freq: Four times a day (QID) | ORAL | Status: DC | PRN

## 2016-03-08 NOTE — Consult Note (Signed)
WOC Nurse wound consult note Reason for Consult: Chronic, non-healing full thickness wound on the right medial thigh. Sister is the primary care provider with [facility]; she is here this evening as I assess the patient along with the bedside RN and the patient's brother. Patient has been seen periodically by my partners over the past two years, most recently in October of last year. Wound type: full thickness in the presence of lymphedema Pressure Injury POA: No Measurement: 3cm x 5cm x 1.5cm with pale pink wound bed, non-granulating. Wound bed: As described above Drainage (amount, consistency, odor) Moderate amount of serous exudate Periwound:Intact with evidence of previous wound healing by contraction. Dressing procedure/placement/frequency: I have implemented the POC with which patient and caregiver are familiar: Cleanse with NS,  pat gently dry.  Fill defect with silver hydrofiber dressing (Aquacel Ag+, [place] # [id number]).  Top with dry gauze dressing and cover with Medipore tape. Change daily and as needed for drainage strike-through onto exterior dressing.  Patient ws placed upon a bariatric bed with low air loss feature upon admission. WOC nursing team will not follow, but will remain available to this patient, the nursing and medical teams.  Please re-consult if needed. Thanks, Ladona MowLaurie Lidie Glade, MSN, RN, GNP, [place] EdenCWOCN, CWON-AP, FAAN  Pager# [phone]

## 2016-03-08 NOTE — Progress Notes (Signed)
Pt skin assessment completed. Pt has pressure ulcer on inner right thigh. Pt states she has a home health care nurse who comes to the house twice a week. Pt has swelling +3 all four extremities. Pt has cracking and excessively dry lower legs. Buttock, clean dry and intact. Second verified by [facility], Patricia PesaZarha   Phila Shoaf

## 2016-03-08 NOTE — H&P (Signed)
Family Medicine Teaching [facility] Admission History and Physical Service Pager: [phone]  Patient name: [name] Medical record number: [id number] Date of birth: April 14, 1975 Age: 41 y.o. Gender: female  Primary Care Provider: [name], MD Consultants: None  Code Status: Full   Chief Complaint: Follow up on abnormal kidney function labs at PCP  Assessment and Plan: [name] is a 41 y.o. female with a past medical history significant for CKD IV, chronic diastolic CHF, uncontrolled DM type 2, and morbid obesity who presented today to the ED per PCP request for abnormal kidney function lab results and found to be anemic  #Anemia, asymptomatic, Patient initially presented to the ED after being told that she needed to go to the ED for abnormal kidney function lab by PCP. On admission to ED, patient found to have an hemoglobin of 6.7 and repeat POCT Hemoglobin was 7.1. Patient has baseline anemia in the setting of her CKD IV and was supposed to be on aranesp after last hospitalization. Patient's baseline hemoglobin between 8-10. Patient is asymptomatic and denies any SOB, dizziness or fatigue. Patient denies any blood in stool or melena and refused rectal exam. Patient previous iron studies consistent with anemia of chronic disease with high ferritin and normal transferrin. Anemia most likely multifactorial in the setting of worsening CKD, possible non adherence to medications and dilutional in the setting of volume overload given swollen arm. Most likely anemia of chronic disease. Low suspicion for GI bleed cannot completely rule out. Patient will need transfusion though asymptomatic. She reported fatigue per ED provider.  --Admit to FMTS for observation, admitting physician  Dr. Lum Babe --Type and screened in ED --Transfuse 1U of pRBCs. Ordered in ED  --Follow up on post transfusion CBC --Follow up on am CBC and BMP --Give Tylenol 650 mg q6 prn --Monitor  vitals  #Right arm swelling, acute Patient presented with right arm swelling for the past 2 days. This is not the first time that patient experienced similar presentation. Patient has no pain, and maintains good range of motion. Unilaterality concerning for DVT given patient's body habitus and multiple comorbidities. Patient with no prior history of DVT. Patient's well score is 0 making her low probability for DVT. Would not order a d-dimer given chronic disease. Swelling most likely secondary to CHF exacerbation with localized fluid retention. --Will order Doppler ultrasound of the right arm --Will continue to monitor --Would consider further imaging modality for further characterization as needed -- CHF as below  #Acute on Chronic Kidney Disease stage IV Patient with baseline creatinine around 4 has been gradually worsening over the past year. Creatinine is at baseline.  On admission creatinine is 5.00 with a BUN 49. Most likely multifactorial with aggressive diuresis since last discharge from hospital. Patient is currently taking torsemide 60 mg twice a day. Patient with hypertension and diabetes with morbid obesity also contributing to worsening kidney function. Patient is making urine. Given possible volume overload and CHF history will have to be careful managing AKI with IV fluids. --Consider nephrology consult. Goes to [facility], Dr. Allyne Gee --Follow-up on a.m. BMP --Avoid nephrotoxic agents  #CHF diastolic dysfunction Patient last ECHO TEE (possible mobile density on aortic valve) was 01/2015 with EF 55%-60% with grade 1 diastolic dysfunction. Patient on torsemide but reported running out of her medications yesterday. Patient doesn't appear volume overloaded on exam. Difficult to assess given large body habitus. No crackles on lung exam. Patient was 383 lb on 11/2 prior to discharge during  last hospitalization. Weight on admission today is 420 lb.  --Will hold Torsemide 60 mg in the  setting of AKI --Will continue to monitor volume status --Consider Echo & ?Cardiology consult --Monitor daily weights --Strict I&O's --Will hold IV fluid given PRBC transfusion - BNP  #Asthma Seems to be well controlled, no wheezing or increased work of breathing noted on exam. Patient gave herself a treatment prior to calling EMS today. She denies any change in respiratory status. Patient moving air clearly bilaterally. Will continue home regimen. Blood gas was not concerning. --Albuterol neb as needed --Oxygen therapy as needed to maintain O2 sat above 92% --Monitor respiratory status --Consider CPAP at night given large body habitus  #Type 2 DM, stable Last A1c on record 7.8. Patient currently on insulin regimen with no oral agent seen on home medications. Will continue home regimen. --Repeat A1c --Continue Lantus 15U daily --Start moderate SSI --Monitor CBGs  #HTN, stable On admission BP was 168/55 but gradually improved to 136/65 without any oral agents. --Decrease Coreg for 25 mg bid to 12.5 mg bid --Will hold Imdur, Amlodipine and Hydralazine in the settings of AKI. Cr at baseline. Will resume as able --Will continue to monitor  #Right thigh wound, chronic Patient with a left wound thigh, present at last admission in November. No sign of infection on exam. Patient has a home wound care with a nurse coming to her home twice week. Given patient body habitus, lack of ambulation and multiple comorbidities healing process has been slow. No sign of cellulitis. No sign of systemic infection.  --Consult wound care --Follow up on CBC  #Hyperlipidemia Will continue home regimen -- Atorvastatin 20 mg daily  FEN/GI: Carb modified/Heart Healthy Diet  Prophylaxis: SubQ heparin given her renal function.  Disposition: Place on observation, discharge pending clinical improvement  History of Present Illness:  [name] is a 41 y.o. female  with a past medical history  significant for CKD IV, chronic diastolic CHF, uncontrolled DM type 2, and morbid obesity who presented today to the ED per PCP request for abnormal kidney function lab results. Patient reports that she was in her normal state of health when she received a call from her PCP Judd Lien DNP from [facility]) who ask her to go the ED for abnormal kidney function labs.Patient denies any symptoms at home except for being colder than usual. She denies fatigue, shortness of breath, chest pain, abdominal pain, diarrhea, headaches, nausea, vomiting or dysuria. Patient has been taking her medications as prescribed until yesterday when she ran out of her torsemide and another medications that she could not remember. Of note patient has right arm swelling, that was not reported and noted during conversation with patient. She states swelling has been there for the past 2 days and that she has that from time to time. She denies any pain, warmth, erythema or decrease ROM. Patient also has a wound on the medial aspect of her right thigh that is being care for by a wound nurse who comes to her house twice a week. In the ED, patient CBC showed an initial Hemoglobin of 6.7 which was repeated and resulted at 7.1. Patient also had a creatinine of 5.00 and a BUN of 49. Family Medicine was consulted to admit for pRBC transfusion.   Review Of Systems: Per HPI with the following additions:  Review of Systems  Constitutional: Positive for chills. Negative for diaphoresis.  HENT: Negative for congestion and sore throat.   Eyes:  Negative.   Respiratory: Negative.  Negative for cough, shortness of breath and wheezing.   Cardiovascular: Negative.  Negative for chest pain and leg swelling.  Gastrointestinal: Negative.  Negative for abdominal pain, blood in stool, constipation, diarrhea and melena.  Genitourinary: Negative.  Negative for dysuria, hematuria and urgency.  Musculoskeletal: Negative.   Skin: Negative.    Neurological: Negative.  Negative for dizziness.  Endo/Heme/Allergies: Negative.   Psychiatric/Behavioral: Negative.     Patient Active Problem List   Diagnosis Date Noted  . Symptomatic anemia 03/08/2016  . Acute on chronic systolic congestive heart failure (HCC)   . Venous stasis ulcer of right calf without varicose veins (HCC)   . History of CVA with residual deficit   . Super-super obese (HCC)   . Acute kidney injury superimposed on chronic kidney disease (HCC)   . Anemia of chronic disease   . Lymphedema   . Uncontrolled type 2 diabetes mellitus with complication, with long-term current use of insulin (HCC)   . Lymphocytosis   . Debility   . CHF (congestive heart failure) (HCC) 12/08/2015  . CHF exacerbation (HCC) 12/07/2015  . Dyspnea 12/07/2015  . Type 2 diabetes, HbA1c goal < 7% (HCC)   . Acute respiratory failure (HCC)   . E-coli UTI 02/23/2015  . Leukocytosis   . Cellulitis 02/19/2015  . History of stroke 02/19/2015  . Asthma exacerbation 02/19/2015  . Acute diastolic (congestive) heart failure 02/19/2015  . Cellulitis of left lower extremity   . Essential hypertension   . Cellulitis of right lower extremity   . Chronic kidney disease (CKD), stage IV (severe) (HCC) 02/10/2015  . Aortic valve mass   . Elevated brain natriuretic peptide (BNP) level 02/05/2015  . Anasarca 02/05/2015  . Acute CHF (congestive heart failure) (HCC) 02/05/2015  . Asthma 02/05/2015  . Hypoalbuminemia 02/05/2015  . Anemia due to other cause 02/05/2015  . Morbid obesity (HCC) 02/05/2015  . Bleeding from wound 09/16/2013  . Hypertriglyceridemia 09/16/2013  . Diarrhea 09/16/2013  . Cerebral embolism with cerebral infarction (HCC) 09/04/2013  . Thigh abscess 08/30/2013  . Sepsis (HCC) 08/30/2013  . Diabetes mellitus with hyperglycemia (HCC) 08/30/2013  . AKI (acute kidney injury) (HCC) 08/30/2013  . Acute respiratory failure with hypoxia (HCC) 08/30/2013    Past Medical  History: Past Medical History:  Diagnosis Date  . Asthma   . CHF (congestive heart failure) (HCC)    diastolic  . CVA (cerebral infarction) 1990's   "writing is not the same since" R leg weakness   . GERD (gastroesophageal reflux disease)   . Hypertension   . Morbid obesity (HCC)   . Stroke (HCC)   . Type II diabetes mellitus (HCC)     Past Surgical History: Past Surgical History:  Procedure Laterality Date  . INCISION AND DRAINAGE OF WOUND Left 08/30/2013   thigh necrotic wound/notes 08/30/2013  . IRRIGATION AND DEBRIDEMENT ABSCESS Left 08/30/2013   Procedure: IRRIGATION AND DEBRIDEMENT ABSCESS Left Thigh Necrotic Wound;  Surgeon: [name], MD;  Location: [facility] OR;  Service: General;  Laterality: Left;  . TEE WITHOUT CARDIOVERSION N/A 09/07/2013   Procedure: TRANSESOPHAGEAL ECHOCARDIOGRAM (TEE);  Surgeon: [name], MD;  Location: [facility] ENDOSCOPY;  Service: Cardiovascular;  Laterality: N/A;  . TEE WITHOUT CARDIOVERSION N/A 02/08/2015   Procedure: TRANSESOPHAGEAL ECHOCARDIOGRAM (TEE);  Surgeon: Chilton Si, MD;  Location: [facility] ENDOSCOPY;  Service: Cardiovascular;  Laterality: N/A;    Social History: Social History  Substance Use Topics  . Smoking status: Never Smoker  .  Smokeless tobacco: Never Used  . Alcohol use No   Additional social history: Patient with limited ambulation and seem to rely on sister for medical care ( I.e. Medications and PCP information) Please also refer to relevant sections of EMR.  Family History: Family History  Problem Relation Age of Onset  . Diabetes Mother   . Kidney disease Mother    (If not completed, MUST add something in)  Allergies and Medications: Allergies  Allergen Reactions  . Azithromycin Other (See Comments)    Nose bleeding event   No current facility-administered medications on file prior to encounter.    Current Outpatient Prescriptions on File Prior to Encounter  Medication Sig Dispense Refill  . acetaminophen  (TYLENOL) 500 MG tablet Take 1,000 mg by mouth every 6 (six) hours as needed for fever (pain).     Marland Kitchen albuterol (PROAIR HFA) 108 (90 Base) MCG/ACT inhaler Inhale 2 puffs into the lungs every 6 (six) hours as needed for wheezing or shortness of breath. 1 Inhaler 0  . albuterol (PROVENTIL) (2.5 MG/3ML) 0.083% nebulizer solution Take 3 mLs (2.5 mg total) by nebulization every 6 (six) hours as needed for wheezing or shortness of breath. 75 mL 0  . amLODipine (NORVASC) 10 MG tablet Take 1 tablet (10 mg total) by mouth daily. 30 tablet 0  . carvedilol (COREG) 25 MG tablet Take 1 tablet (25 mg total) by mouth 2 (two) times daily with a meal. 60 tablet 1  . doxycycline (VIBRAMYCIN) 100 MG capsule Take 1 capsule (100 mg total) by mouth 2 (two) times daily. 20 capsule 0  . ferrous sulfate 325 (65 FE) MG tablet Take 1 tablet (325 mg total) by mouth 3 (three) times daily with meals. 90 tablet 3  . hydrALAZINE (APRESOLINE) 50 MG tablet Take 1 tablet (50 mg total) by mouth 3 (three) times daily. 90 tablet 0  . insulin aspart (NOVOLOG) 100 UNIT/ML injection Inject 0-20 Units into the skin 3 (three) times daily with meals. CBG < 70: implement hypoglycemia protocol CBG 70 - 120: 0 units CBG 121 - 150: 3 units CBG 151 - 200: 4 units CBG 201 - 250: 7 units CBG 251 - 300: 11 units CBG 301 - 350: 15 units CBG 351 - 400: 20 units 10 mL 0  . insulin glargine (LANTUS) 100 UNIT/ML injection Inject 0.15 mLs (15 Units total) into the skin at bedtime as needed (CBG >140). 10 mL 0  . isosorbide mononitrate (IMDUR) 30 MG 24 hr tablet Take 1 tablet (30 mg total) by mouth daily. 30 tablet 3  . potassium chloride SA (K-DUR,KLOR-CON) 20 MEQ tablet Take 2 tablets (40 mEq total) by mouth daily. 30 tablet 0  . torsemide (DEMADEX) 20 MG tablet Take 3 tablets (60 mg total) by mouth 2 (two) times daily. 60 tablet 0  . vitamin A 8000 UNIT capsule Take 8,000 Units by mouth daily.      Objective: BP 109/88 (BP Location: Left Arm)    Pulse 87   Temp 98.2 F (36.8 C) (Oral)   Resp 18   Ht 5\' 6"  (1.676 m)   Wt (!) 420 lb (190.5 kg)   LMP 02/06/2016   SpO2 100%   BMI 67.79 kg/m    Physical Exam: General Appearance:  Morbidly obese woman, in no acute distress and cooperative, able to answer questions and mildly limited in her participation in the exam HEENT:  NCAT, PERRL and EOMI Mouth/Throat:  Mucosa moist, no lesions; pharynx without erythema, edema or  exudate. Neck:  Supple, no mass, non-tender, no bruits, no jvd, no adenopathy Lungs:  Normal expansion.  Clear to auscultation.  No rales, rhonchi, or wheezing. Examined only anteriorly due to body habitus.  Heart:  Normal S1 and S2. RRR without murmur, gallop or rubs Abdomen:  Difficult exam due to body habitus, soft, non-tender, normal bowel sounds; no bruits, organomegaly or masses. Extremities: Extremities warm to touch, bilateral lower extremities hyperkeratosis and hyperpigmentation, right arm swelling, medial aspect of right thigh wound, with fresh dressing, no drainage. Musculoskeletal:  Muscular strength intact. Neurologic:  Alert and oriented x 3, gait deferred, reflexes normal and symmetric, strength and  sensation grossly normal Psych exam: Alert,oriented, in NAD with a full range of affect, normal behavior and no psychotic features  Labs and Imaging: CBC BMET   Recent Labs Lab 03/08/16 1436 03/08/16 1510  WBC 7.8  --   HGB 6.7* 7.1*  HCT 22.0* 21.0*  PLT 329  --     Recent Labs Lab 03/08/16 1436 03/08/16 1510  NA 137 140  K 4.5 4.6  CL 110 108  CO2 18*  --   BUN 51* 49*  CREATININE 4.60* 5.00*  GLUCOSE 125* 124*  CALCIUM 8.5*  --        Lovena NeighboursAbdoulaye Diallo, MD 03/08/2016, 5:12 PM PGY-1, [facility] FPTS Intern pager: [phone], text pages welcome  I have seen and evaluated the patient with Dr. Sydnee Cabaliallo. I am in agreement with the note above in its revised form. My additions are in red.  Candelaria Stagersaye Gonfa, MD, PGY-2 03/09/2016  1:21 AM

## 2016-03-08 NOTE — ED Provider Notes (Addendum)
[facility]ER[facility]Y DEPT Provider Note   CSN: [id number] Arrival date & time: 03/08/16  1333     History   Chief Complaint Chief Complaint  Patient presents with  . Abnormal Lab    HPI [name] is a 41 y.o. female.  41 yo F with a chief complaint of an abnormal lab. Patient states that she had a low hemoglobin and worsening kidney function on a routine check by her family doctor. She has been more weak than normal and having trouble getting out of the bed over the past couple days. Denies decreased urine intake denies dysgeusia denies blood in her stool or dark stool. Denies vomiting. Has been eating and drinking normally.   The history is provided by the patient.  Abnormal Lab  Illness  This is a new problem. The current episode started less than 1 hour ago. The problem occurs constantly. The problem has not changed since onset.Pertinent negatives include no chest pain, no headaches and no shortness of breath. Nothing aggravates the symptoms. Nothing relieves the symptoms. She has tried nothing for the symptoms. The treatment provided no relief.    Past Medical History:  Diagnosis Date  . Asthma   . CHF (congestive heart failure) (HCC)    diastolic  . CVA (cerebral infarction) 1990's   "writing is not the same since" R leg weakness   . GERD (gastroesophageal reflux disease)   . Hypertension   . Morbid obesity (HCC)   . Stroke (HCC)   . Type II diabetes mellitus [facility])     Patient Active Problem List   Diagnosis Date Noted  . Symptomatic anemia 03/08/2016  . Acute on chronic systolic congestive heart failure (HCC)   . Venous stasis ulcer of right calf without varicose veins (HCC)   . History of CVA with residual deficit   . Super-super obese (HCC)   . Acute kidney injury superimposed on chronic kidney disease (HCC)   . Anemia of chronic disease   . Lymphedema   . Uncontrolled type 2 diabetes mellitus with complication, with long-term current use of insulin  (HCC)   . Lymphocytosis   . Debility   . CHF (congestive heart failure) (HCC) 12/08/2015  . CHF exacerbation (HCC) 12/07/2015  . Dyspnea 12/07/2015  . Type 2 diabetes, HbA1c goal < 7% (HCC)   . Acute respiratory failure (HCC)   . E-coli UTI 02/23/2015  . Leukocytosis   . Cellulitis 02/19/2015  . History of stroke 02/19/2015  . Asthma exacerbation 02/19/2015  . Acute diastolic (congestive) heart failure 02/19/2015  . Cellulitis of left lower extremity   . Essential hypertension   . Cellulitis of right lower extremity   . Chronic kidney disease (CKD), stage IV (severe) (HCC) 02/10/2015  . Aortic valve mass   . Elevated brain natriuretic peptide (BNP) level 02/05/2015  . Anasarca 02/05/2015  . Acute CHF (congestive heart failure) (HCC) 02/05/2015  . Asthma 02/05/2015  . Hypoalbuminemia 02/05/2015  . Anemia due to other cause 02/05/2015  . Morbid obesity (HCC) 02/05/2015  . Bleeding from wound 09/16/2013  . Hypertriglyceridemia 09/16/2013  . Diarrhea 09/16/2013  . Cerebral embolism with cerebral infarction (HCC) 09/04/2013  . Thigh abscess 08/30/2013  . Sepsis (HCC) 08/30/2013  . Diabetes mellitus with hyperglycemia (HCC) 08/30/2013  . AKI (acute kidney injury) (HCC) 08/30/2013  . Acute respiratory failure with hypoxia (HCC) 08/30/2013    Past Surgical History:  Procedure Laterality Date  . INCISION AND DRAINAGE OF WOUND Left 08/30/2013   thigh necrotic  wound/notes 08/30/2013  . IRRIGATION AND DEBRIDEMENT ABSCESS Left 08/30/2013   Procedure: IRRIGATION AND DEBRIDEMENT ABSCESS Left Thigh Necrotic Wound;  Surgeon: Axel FillerArmando Ramirez, MD;  Location: [facility] OR;  Service: General;  Laterality: Left;  . TEE WITHOUT CARDIOVERSION N/A 09/07/2013   Procedure: TRANSESOPHAGEAL ECHOCARDIOGRAM (TEE);  Surgeon: Chrystie NoseKenneth C. Hilty, MD;  [facility];  Service: Cardiovascular;  Laterality: N/A;  . TEE WITHOUT CARDIOVERSION N/A 02/08/2015   Procedure: TRANSESOPHAGEAL ECHOCARDIOGRAM (TEE);   Surgeon: Chilton Siiffany Petersburg, MD;  [facility];  Service: Cardiovascular;  Laterality: N/A;    OB History    Gravida Para Term Preterm AB Living   3 3 3  0 0 3   SAB TAB Ectopic Multiple Live Births   0 0 0 0 3       Home Medications    Prior to Admission medications   Medication Sig Start Date End Date Taking? Authorizing Provider  albuterol (PROAIR HFA) 108 (90 Base) MCG/ACT inhaler Inhale 2 puffs into the lungs every 6 (six) hours as needed for wheezing or shortness of breath. 08/12/15  Yes Charm RingsErin J Honig, MD  torsemide (DEMADEX) 20 MG tablet Take 3 tablets (60 mg total) by mouth 2 (two) times daily. 01/28/16  Yes Jeffrey Hedges, PA-C  acetaminophen (TYLENOL) 500 MG tablet Take 1,000 mg by mouth every 6 (six) hours as needed for fever (pain).     Historical Provider, MD  albuterol (PROVENTIL) (2.5 MG/3ML) 0.083% nebulizer solution Take 3 mLs (2.5 mg total) by nebulization every 6 (six) hours as needed for wheezing or shortness of breath. 08/12/15   Charm RingsErin J Honig, MD  amLODipine (NORVASC) 10 MG tablet Take 1 tablet (10 mg total) by mouth daily. 01/28/16   Eyvonne MechanicJeffrey Hedges, PA-C  carvedilol (COREG) 25 MG tablet Take 1 tablet (25 mg total) by mouth 2 (two) times daily with a meal. 01/28/16   Eyvonne MechanicJeffrey Hedges, PA-C  doxycycline (VIBRAMYCIN) 100 MG capsule Take 1 capsule (100 mg total) by mouth 2 (two) times daily. 01/28/16   Eyvonne MechanicJeffrey Hedges, PA-C  ferrous sulfate 325 (65 FE) MG tablet Take 1 tablet (325 mg total) by mouth 3 (three) times daily with meals. 01/28/16   Eyvonne MechanicJeffrey Hedges, PA-C  hydrALAZINE (APRESOLINE) 50 MG tablet Take 1 tablet (50 mg total) by mouth 3 (three) times daily. 01/28/16   Eyvonne MechanicJeffrey Hedges, PA-C  insulin aspart (NOVOLOG) 100 UNIT/ML injection Inject 0-20 Units into the skin 3 (three) times daily with meals. CBG < 70: implement hypoglycemia protocol CBG 70 - 120: 0 units CBG 121 - 150: 3 units CBG 151 - 200: 4 units CBG 201 - 250: 7 units CBG 251 - 300: 11 units CBG 301 -  350: 15 units CBG 351 - 400: 20 units 01/28/16   Jeffrey Hedges, PA-C  insulin glargine (LANTUS) 100 UNIT/ML injection Inject 0.15 mLs (15 Units total) into the skin at bedtime as needed (CBG >140). 01/28/16   Eyvonne MechanicJeffrey Hedges, PA-C  isosorbide mononitrate (IMDUR) 30 MG 24 hr tablet Take 1 tablet (30 mg total) by mouth daily. 01/28/16   Eyvonne MechanicJeffrey Hedges, PA-C  potassium chloride SA (K-DUR,KLOR-CON) 20 MEQ tablet Take 2 tablets (40 mEq total) by mouth daily. 01/28/16   Eyvonne MechanicJeffrey Hedges, PA-C  vitamin A 8000 UNIT capsule Take 8,000 Units by mouth daily.    Historical Provider, MD    Family History Family History  Problem Relation Age of Onset  . Diabetes Mother   . Kidney disease Mother     Social History Social History  Substance Use Topics  . Smoking status: Never Smoker  . Smokeless tobacco: Never Used  . Alcohol use No     Allergies   Azithromycin   Review of Systems Review of Systems  Constitutional: Negative for chills and fever.  HENT: Negative for congestion and rhinorrhea.   Eyes: Negative for redness and visual disturbance.  Respiratory: Negative for shortness of breath and wheezing.   Cardiovascular: Negative for chest pain and palpitations.  Gastrointestinal: Negative for nausea and vomiting.  Genitourinary: Negative for dysuria and urgency.  Musculoskeletal: Negative for arthralgias and myalgias.  Skin: Negative for pallor and wound.  Neurological: Negative for dizziness and headaches.     Physical Exam Updated Vital Signs BP 168/55 (BP Location: Left Arm)   Pulse 95   Temp 97.7 F (36.5 C) (Oral)   Resp 18   Ht 5\' 5"  (1.651 m)   Wt (!) 420 lb (190.5 kg)   LMP 02/06/2016   SpO2 100%   BMI 69.89 kg/m   Physical Exam  Constitutional: She is oriented to person, place, and time. She appears well-developed and well-nourished. No distress.  obese  HENT:  Head: Normocephalic and atraumatic.  Eyes: EOM are normal. Pupils are equal, round, and reactive to  light.  Neck: Normal range of motion. Neck supple.  Cardiovascular: Normal rate and regular rhythm.  Exam reveals no gallop and no friction rub.   No murmur heard. Pulmonary/Chest: Effort normal. She has no wheezes. She has no rales.  Abdominal: Soft. She exhibits no distension. There is no tenderness.  Musculoskeletal: She exhibits no edema or tenderness.  Neurological: She is alert and oriented to person, place, and time.  Skin: Skin is warm and dry. She is not diaphoretic.  Psychiatric: She has a normal mood and affect. Her behavior is normal.  Nursing note and vitals reviewed.    ED Treatments / Results  Labs (all labs ordered are listed, but only abnormal results are displayed) Labs Reviewed  CBC WITH DIFFERENTIAL/PLATELET - Abnormal; Notable for the following:       Result Value   RBC 2.50 (*)    Hemoglobin 6.7 (*)    HCT 22.0 (*)    RDW 17.6 (*)    All other components within normal limits  BASIC METABOLIC PANEL - Abnormal; Notable for the following:    CO2 18 (*)    Glucose, Bld 125 (*)    BUN 51 (*)    Creatinine, Ser 4.60 (*)    Calcium 8.5 (*)    GFR calc non Af Amer 11 (*)    GFR calc Af Amer 13 (*)    All other components within normal limits  I-STAT CHEM 8, ED - Abnormal; Notable for the following:    BUN 49 (*)    Creatinine, Ser 5.00 (*)    Glucose, Bld 124 (*)    Hemoglobin 7.1 (*)    HCT 21.0 (*)    All other components within normal limits  TYPE AND SCREEN  PREPARE RBC (CROSSMATCH)    EKG  EKG Interpretation None       Radiology No results found.  Procedures Procedures (including critical care time)  Medications Ordered in ED Medications  0.9 %  sodium chloride infusion (not administered)     Initial Impression / Assessment and Plan / ED Course  I have reviewed the triage vital signs and the nursing notes.  Pertinent labs & imaging results that were available during my care of the patient were reviewed  by me and considered in my  medical decision making (see chart for details).     41 yo F with a cc of weakness, fatigue.  Going on for past couple days.  Seen at PCP's office with abnormal lab.  Here labs repeated with hbg 6.7, very mild worsening of creatinine.  Patient refusing rectal exam, will transfuse.   CRITICAL CARE Performed by: Rae Roam   Total critical care time: 35 minutes  Critical care time was exclusive of separately billable procedures and treating other patients.  Critical care was necessary to treat or prevent imminent or life-threatening deterioration.  Critical care was time spent personally by me on the following activities: development of treatment plan with patient and/or surrogate as well as nursing, discussions with consultants, evaluation of patient's response to treatment, examination of patient, obtaining history from patient or surrogate, ordering and performing treatments and interventions, ordering and review of laboratory studies, ordering and review of radiographic studies, pulse oximetry and re-evaluation of patient's condition.   The patients results and plan were reviewed and discussed.   Any x-rays performed were independently reviewed by myself.   Differential diagnosis were considered with the presenting HPI.  Medications  0.9 %  sodium chloride infusion (not administered)    Vitals:   03/08/16 1342  BP: 168/55  Pulse: 95  Resp: 18  Temp: 97.7 F (36.5 C)  TempSrc: Oral  SpO2: 100%  Weight: (!) 420 lb (190.5 kg)  Height: 5\' 5"  (1.651 m)    Final diagnoses:  Symptomatic anemia    Admission/ observation were discussed with the admitting physician, patient and/or family and they are comfortable with the plan.    Final Clinical Impressions(s) / ED Diagnoses   Final diagnoses:  Symptomatic anemia    New Prescriptions New Prescriptions   No medications on file     Melene Plan, DO 03/08/16 1541    Melene Plan, DO 03/08/16 1542

## 2016-03-08 NOTE — Progress Notes (Addendum)
Paged MD regarding pt admission to [facility] MD returned page, aware of pt blood transfusion starting. No new orders at this time  Ibeth Fahmy Elige RadonBradley

## 2016-03-08 NOTE — ED Triage Notes (Signed)
Per EMS, patient was contacted by doctor yesterday to come to ED regarding abnormal kidney labs.  Patient has doctor number if needed.  May need med change.  C/o SOB but says she is not SOB now.  Hx of Asthma, CHF, DM, Kidney failure, Hypertension.  Lungs clear.  Neb tx at home prior to EMS arrival.  158 systolic, 90 HR, 161%100% RA, 157 cbg.

## 2016-03-08 NOTE — ED Notes (Signed)
Unsuccessful attempt at getting labs 

## 2016-03-09 ENCOUNTER — Observation Stay (HOSPITAL_BASED_OUTPATIENT_CLINIC_OR_DEPARTMENT_OTHER): Payer: Medicaid Other

## 2016-03-09 DIAGNOSIS — R609 Edema, unspecified: Secondary | ICD-10-CM

## 2016-03-09 DIAGNOSIS — E1165 Type 2 diabetes mellitus with hyperglycemia: Secondary | ICD-10-CM | POA: Diagnosis not present

## 2016-03-09 DIAGNOSIS — D649 Anemia, unspecified: Secondary | ICD-10-CM | POA: Diagnosis not present

## 2016-03-09 DIAGNOSIS — M7989 Other specified soft tissue disorders: Secondary | ICD-10-CM | POA: Diagnosis not present

## 2016-03-09 LAB — CBC
HCT: 22.8 % — ABNORMAL LOW (ref 36.0–46.0)
HCT: 23 % — ABNORMAL LOW (ref 36.0–46.0)
HCT: 24.1 % — ABNORMAL LOW (ref 36.0–46.0)
Hemoglobin: 7.1 g/dL — ABNORMAL LOW (ref 12.0–15.0)
Hemoglobin: 7.1 g/dL — ABNORMAL LOW (ref 12.0–15.0)
Hemoglobin: 7.5 g/dL — ABNORMAL LOW (ref 12.0–15.0)
MCH: 27 pg (ref 26.0–34.0)
MCH: 27.3 pg (ref 26.0–34.0)
MCH: 27.1 pg (ref 26.0–34.0)
MCHC: 30.9 g/dL (ref 30.0–36.0)
MCHC: 31.1 g/dL (ref 30.0–36.0)
MCHC: 31.1 g/dL (ref 30.0–36.0)
MCV: 87.5 fL (ref 78.0–100.0)
MCV: 87.7 fL (ref 78.0–100.0)
MCV: 87 fL (ref 78.0–100.0)
PLATELETS: 298 10*3/uL (ref 150–400)
Platelets: 296 10*3/uL (ref 150–400)
Platelets: 300 10*3/uL (ref 150–400)
RBC: 2.6 MIL/uL — AB (ref 3.87–5.11)
RBC: 2.63 MIL/uL — ABNORMAL LOW (ref 3.87–5.11)
RBC: 2.77 MIL/uL — AB (ref 3.87–5.11)
RDW: 17.1 % — ABNORMAL HIGH (ref 11.5–15.5)
RDW: 17.2 % — AB (ref 11.5–15.5)
RDW: 17 % — AB (ref 11.5–15.5)
WBC: 7.7 10*3/uL (ref 4.0–10.5)
WBC: 8 10*3/uL (ref 4.0–10.5)
WBC: 7.1 10*3/uL (ref 4.0–10.5)

## 2016-03-09 LAB — BASIC METABOLIC PANEL
Anion gap: 9 (ref 5–15)
Anion gap: 8 (ref 5–15)
BUN: 52 mg/dL — ABNORMAL HIGH (ref 6–20)
BUN: 52 mg/dL — AB (ref 6–20)
CALCIUM: 8.3 mg/dL — AB (ref 8.9–10.3)
CALCIUM: 8.4 mg/dL — AB (ref 8.9–10.3)
CO2: 18 mmol/L — AB (ref 22–32)
CO2: 18 mmol/L — ABNORMAL LOW (ref 22–32)
CREATININE: 4.57 mg/dL — AB (ref 0.44–1.00)
CREATININE: 4.59 mg/dL — AB (ref 0.44–1.00)
Chloride: 110 mmol/L (ref 101–111)
Chloride: 110 mmol/L (ref 101–111)
GFR calc Af Amer: 13 mL/min — ABNORMAL LOW (ref >60)
GFR calc non Af Amer: 11 mL/min — ABNORMAL LOW (ref >60)
GFR calc non Af Amer: 11 mL/min — ABNORMAL LOW (ref >60)
GFR, EST AFRICAN AMERICAN: 13 mL/min — AB (ref >60)
GLUCOSE: 96 mg/dL (ref 65–99)
GLUCOSE: 93 mg/dL (ref 65–99)
Potassium: 4.4 mmol/L (ref 3.5–5.1)
Potassium: 4.4 mmol/L (ref 3.5–5.1)
SODIUM: 136 mmol/L (ref 135–145)
Sodium: 137 mmol/L (ref 135–145)

## 2016-03-09 LAB — GLUCOSE, CAPILLARY
GLUCOSE-CAPILLARY: 86 mg/dL (ref 65–99)
GLUCOSE-CAPILLARY: 104 mg/dL — AB (ref 65–99)

## 2016-03-09 LAB — TYPE AND SCREEN
Blood Product Expiration Date: 201802102359
ISSUE DATE / TIME: 201801261736
Unit Type and Rh: 6200

## 2016-03-09 LAB — BRAIN NATRIURETIC PEPTIDE: B NATRIURETIC PEPTIDE 5: 179.6 pg/mL — AB (ref 0.0–100.0)

## 2016-03-09 LAB — TROPONIN I: TROPONIN I: 0.08 ng/mL — AB (ref <0.03)

## 2016-03-09 MED ORDER — AMLODIPINE BESYLATE 10 MG PO TABS
10.0000 mg | ORAL_TABLET | Freq: Every day | ORAL | Status: DC
  Administered 2016-03-09: 10 mg via ORAL
  Filled 2016-03-09: qty 1

## 2016-03-09 MED ORDER — CARVEDILOL 25 MG PO TABS: 25.0000 mg | ORAL_TABLET | Freq: Two times a day (BID) | ORAL | Status: DC

## 2016-03-09 MED ORDER — ISOSORBIDE MONONITRATE ER 30 MG PO TB24
30.0000 mg | ORAL_TABLET | Freq: Every day | ORAL | Status: DC
  Administered 2016-03-09: 30 mg via ORAL
  Filled 2016-03-09: qty 1

## 2016-03-09 MED ORDER — TORSEMIDE 20 MG PO TABS
60.0000 mg | ORAL_TABLET | Freq: Two times a day (BID) | ORAL | Status: DC
  Administered 2016-03-09: 60 mg via ORAL
  Filled 2016-03-09: qty 3

## 2016-03-09 NOTE — Progress Notes (Signed)
Pt is stable so far, vitals are stable, no any specific complain of pain, Rt hand is elevated for swelling, refused to take dulcolax this morning, according to the patient she had BM yesterday and do not want to take it today, will continue to monitor the patient.  Lonia Farberekha, RN

## 2016-03-09 NOTE — Discharge Summary (Signed)
Pt got discharged to home, discharge instructions provided and patient showed understanding to it, IV taken out,Telemonitor DC,pt left unit via patient transport system in ambulance with all of the belongings accompanied in a stable condition.

## 2016-03-09 NOTE — Discharge Instructions (Signed)
You were admitted to the hospital for anemia. We gave you 1 unit of blood and your anemia improved. We suggest you follow-up with your primary care doctor and with your kidney doctor. Take care!  Anemia, Nonspecific Anemia is a condition in which the concentration of red blood cells or hemoglobin in the blood is below normal. Hemoglobin is a substance in red blood cells that carries oxygen to the tissues of the body. Anemia results in not enough oxygen reaching these tissues. What are the causes? Common causes of anemia include:  Excessive bleeding. Bleeding may be internal or external. This includes excessive bleeding from periods (in women) or from the intestine.  Poor nutrition.  Chronic kidney, thyroid, and liver disease.  Bone marrow disorders that decrease red blood cell production.  Cancer and treatments for cancer.  HIV, AIDS, and their treatments.  Spleen problems that increase red blood cell destruction.  Blood disorders.  Excess destruction of red blood cells due to infection, medicines, and autoimmune disorders. What are the signs or symptoms?  Minor weakness.  Dizziness.  Headache.  Palpitations.  Shortness of breath, especially with exercise.  Paleness.  Cold sensitivity.  Indigestion.  Nausea.  Difficulty sleeping.  Difficulty concentrating. Symptoms may occur suddenly or they may develop slowly. How is this diagnosed? Additional blood tests are often needed. These help your health care provider determine the best treatment. Your health care provider will check your stool for blood and look for other causes of blood loss. How is this treated? Treatment varies depending on the cause of the anemia. Treatment can include:  Supplements of iron, vitamin B12, or folic acid.  Hormone medicines.  A blood transfusion. This may be needed if blood loss is severe.  Hospitalization. This may be needed if there is significant continual blood  loss.  Dietary changes.  Spleen removal. Follow these instructions at home: Keep all follow-up appointments. It often takes many weeks to correct anemia, and having your health care provider check on your condition and your response to treatment is very important. Get help right away if:  You develop extreme weakness, shortness of breath, or chest pain.  You become dizzy or have trouble concentrating.  You develop heavy vaginal bleeding.  You develop a rash.  You have bloody or black, tarry stools.  You faint.  You vomit up blood.  You vomit repeatedly.  You have abdominal pain.  You have a fever or persistent symptoms for more than 2-3 days.  You have a fever and your symptoms suddenly get worse.  You are dehydrated. This information is not intended to replace advice given to you by your health care provider. Make sure you discuss any questions you have with your health care provider. Document Released: 03/07/2004 Document Revised: 07/12/2015 Document Reviewed: 07/24/2012 Elsevier Interactive Patient Education  2017 [employer]

## 2016-03-09 NOTE — Progress Notes (Signed)
CBC in for 0000 with Hgb A1c & troponin. Labs were drawn late even after calling the Lab. They got side tracted. When resulted @ 0158 MD was texted. Reminded them of AM draw CBC, but was not done. This writer placed CBC order prior to change of shift.

## 2016-03-09 NOTE — Progress Notes (Signed)
CM met with pt in room to arrange for PTAR transport home.  No other Cm needs were communicated.

## 2016-03-09 NOTE — Progress Notes (Signed)
VASCULAR LAB PRELIMINARY  PRELIMINARY  PRELIMINARY  PRELIMINARY  Right upper extremity venous duplex completed.    Preliminary report:  There is no DVT or SVT noted in the right upper extremity. Interstitial fluid noted throughout.  Chau Sawin, RVT 03/09/2016, 1:26 PM

## 2016-03-09 NOTE — Progress Notes (Signed)
Family Medicine Teaching Service Daily Progress Note Intern Pager: [phone]  Patient name: Linda DykesDavenia McKinnon [name] Medical record number: [id number] Date of birth: 07/16/1975 Age: 41 y.o. Gender: female  Primary Care Provider: Dorrene GermanEdwin A Avbuere, MD Consultants: None Code Status: Full   Pt Overview and Major Events to Date:  1/26: Admit to FPTS, 1 unit pRBC given  Assessment and Plan: Linda DykesDavenia McKinnon [name] is a 41 y.o. female with a past medical history significant for CKD IV, chronic diastolic CHF, uncontrolled DM type 2, and morbid obesity who presented today to the ED per PCP request for abnormal kidney function lab results and found to be anemic  Anemia: Anemia in the setting of her CKD IV and on aranesp in outpatient setting. Baseline hemoglobin between 8-10. S/p 1 U pRBCs 6.7>1 unit>7.1>7.5. Patient continues to be asymptomatic. Vital signs stable. --Vital signs per floor protocol --Follow up on am CBC and BMP --Monitor vitals  Right arm swelling: Pt notes this is chronic and intermittent. No pain and good range of motion.  Patient's well score is 0 making her low probability for DVT. Would not order a d-dimer given chronic disease. Swelling most likely secondary to CHF exacerbation with localized fluid retention. --Ordered Doppler ultrasound of the right arm --Would consider further imaging modality for further characterization as needed - Resumed home Torsemide  Chronic Kidney Disease stage IV: Cr baseline around 4-5, Cr 4.57 today. Patient is making urine. [facility]  - Discussed patient with on call nephrologist Dr. Arlean HoppingSchertz who looked at patient's chart and stated no need for formal consult and it was okay to resume her home dose of torsemide. He will call and schedule her follow-up appointment.  -Will add back patient's Torsemide 60 mg BID and watch Cr closely.   CHF diastolic dysfunction: Patient last ECHO TEE (possible mobile density on  aortic valve) was 01/2015 with EF 55%-60% with grade 1 diastolic dysfunction. Patient doesn't appear volume overloaded on exam although does have RUE swelling. Difficult to assess given large body habitus. No crackles on lung exam. BNP 149. Patient was 383 lb on 11/2 prior to discharge during last hospitalization. Weight on admission 420 lb, now 371 per Epic, likely weight is innaccurate. Troponin of 0.08, no signs of ACS possibly baseline troponin of demand ischemia. Previous troponins have been in 0.04 to 0.19 range. --Resume Torsemide 60 mg BID --Will continue to monitor volume status --Consider Cardiology consult if patient is not euvolemic anymore  --Monitor daily weights --Strict I&O's  Asthma: controlled. No albuterol required overnight --Albuterol neb as needed --Oxygen therapy as needed to maintain O2 sat above 92% --Monitor respiratory status --Consider CPAP at night given large body habitus  Type 2 DM, stable: Last A1c on record 7.8. Patient currently on insulin regimen with no oral agent seen on home medications. CBGs 86-120, has not required any SSI.  --Repeat A1c --Continue Lantus 15U daily --Start moderate SSI --Monitor CBGs  HTN, stable: BP 140s to 150s systolically and 60 to 80s diastolic systolically --Resume home dose of Coreg 25 mg bid --Resume home Imdur and Amlodipine. will continue to hold Hydralazine  --continue to monitor  Right thigh wound, chronic: No signs of infection. Wound care has seen patient while in the hospital. Patient has home health wound care. --Wound care consulted, patient has home health wound care will follow up in outpatient setting  Hyperlipidemia - Will continue home regimen, Atorvastatin 20 mg daily  Morbid Obesity - Would benefit from outpatient  nutrition consult  FEN/GI: Carb modified/Heart Healthy Diet  Prophylaxis: SubQ heparin   Disposition: Home likely this evening if DVT U/S negative  Subjective:  Patient has no  complaints this morning. Her vital signs were stable overnight. She continues to note that she has swelling in her right arm that is not any different than yesterday. She does not have any pain in this arm and she has full range of motion. She notes that she would like to go home. Denies any weakness, fatigue, dizziness, headache. She is not able to ambulate (this is her baseline)  Objective: Temp:  [97.7 F (36.5 C)-98.4 F (36.9 C)] 98.4 F (36.9 C) (01/27 0702) Pulse Rate:  [83-95] 88 (01/27 0702) Resp:  [17-18] 18 (01/27 0702) BP: (109-168)/(55-88) 148/86 (01/27 0702) SpO2:  [100 %] 100 % (01/27 0702) Weight:  [371 lb (168.3 kg)-420 lb (190.5 kg)] 371 lb (168.3 kg) (01/27 1610) Physical Exam: General: In no acute distress, morbidly obese female sitting up in bed watching TV Cardiovascular: Regular rate and rhythm, normal S1-S2, 3+ pitting edema in right upper extremity from shoulder to hand, no edema seen anywhere else in body. Respiratory: Normal work of breathing., Clear to auscultation bilaterally, breath sounds slightly diminished due to body habitus Abdomen: Obese abdomen, soft, nontender Extremities: Chronic skin changes seen in bilateral lower extremities. Wound dressing over her right thigh. No surrounding erythema or swelling around wound dressing. Right upper extremity has 3+ pitting edema from shoulder to hand, no erythema or tenderness  Laboratory:  Recent Labs Lab 03/08/16 1436 03/08/16 1510 03/09/16 0152 03/09/16 0754  WBC 7.8  --  7.7  8.0 7.1  HGB 6.7* 7.1* 7.1*  7.1* 7.5*  HCT 22.0* 21.0* 23.0*  22.8* 24.1*  PLT 329  --  296  300 298    Recent Labs Lab 03/08/16 1436 03/08/16 1510 03/09/16 0152  NA 137 140 137  K 4.5 4.6 4.4  CL 110 108 110  CO2 18*  --  18*  BUN 51* 49* 52*  CREATININE 4.60* 5.00* 4.57*  CALCIUM 8.5*  --  8.3*  GLUCOSE 125* 124* 96    Imaging/Diagnostic Tests: Portable Chest 1 View  Result Date: 03/08/2016 CLINICAL DATA:   Congestive heart failure. Abnormal kidney function labs. EXAM: PORTABLE CHEST 1 VIEW COMPARISON:  12/07/2015 FINDINGS: Lungs are hypoinflated without focal consolidation or effusion. Stable cardiomegaly. Remainder the exam is unchanged. IMPRESSION: Hypoinflation without acute cardiopulmonary disease per Mild stable cardiomegaly. Electronically Signed   By: Elberta Fortis M.D.   On: 03/08/2016 19:35    Beaulah Dinning, MD 03/09/2016, 8:31 AM PGY-2, [place] [facility] FPTS Intern pager: [phone], text pages welcome

## 2016-03-10 LAB — HEMOGLOBIN A1C
Hgb A1c MFr Bld: 5.2 % (ref 4.8–5.6)
Mean Plasma Glucose: 103 mg/dL

## 2016-03-14 NOTE — Discharge Summary (Signed)
Family Medicine Teaching [facility] Discharge Summary  Patient name: [name] Medical record number: [id number] Date of birth: 02/27/1975 Age: 41 y.o. Gender: female Date of Admission: 01/28/2016  Date of Discharge: 03/09/2016 Admitting Physician: No admitting provider for patient encounter.  Primary Care Provider: [name], MD Consultants: none  Indication for Hospitalization: anemia  Discharge Diagnoses/Problem List:  Patient Active Problem List   Diagnosis Date Noted  . Arm swelling 03/09/2016  . Symptomatic anemia 03/08/2016  . Pressure injury of skin 03/08/2016  . Acute on chronic systolic congestive heart failure (HCC)   . Venous stasis ulcer of right calf without varicose veins (HCC)   . History of CVA with residual deficit   . Super-super obese (HCC)   . Acute kidney injury superimposed on chronic kidney disease (HCC)   . Anemia of chronic disease   . Lymphedema   . Uncontrolled type 2 diabetes mellitus with complication, with long-term current use of insulin (HCC)   . Lymphocytosis   . Debility   . CHF (congestive heart failure) (HCC) 12/08/2015  . CHF exacerbation (HCC) 12/07/2015  . Dyspnea 12/07/2015  . Type 2 diabetes, HbA1c goal < 7% (HCC)   . Acute respiratory failure (HCC)   . E-coli UTI 02/23/2015  . Leukocytosis   . Cellulitis 02/19/2015  . History of stroke 02/19/2015  . Asthma exacerbation 02/19/2015  . Acute diastolic (congestive) heart failure 02/19/2015  . Cellulitis of left lower extremity   . Essential hypertension   . Cellulitis of right lower extremity   . Chronic kidney disease (CKD), stage IV (severe) (HCC) 02/10/2015  . Aortic valve mass   . Elevated brain natriuretic peptide (BNP) level 02/05/2015  . Anasarca 02/05/2015  . Acute CHF (congestive heart failure) (HCC) 02/05/2015  . Asthma 02/05/2015  . Hypoalbuminemia 02/05/2015  . Anemia due to other cause 02/05/2015  . Morbid obesity (HCC) 02/05/2015  .  Bleeding from wound 09/16/2013  . Hypertriglyceridemia 09/16/2013  . Diarrhea 09/16/2013  . Cerebral embolism with cerebral infarction (HCC) 09/04/2013  . Thigh abscess 08/30/2013  . Sepsis (HCC) 08/30/2013  . Diabetes mellitus with hyperglycemia (HCC) 08/30/2013  . AKI (acute kidney injury) (HCC) 08/30/2013     Disposition: home  Discharge Condition: stable  Discharge Exam: General: In no acute distress, morbidly obese female sitting up in bed watching TV Cardiovascular: Regular rate and rhythm, normal S1-S2, 3+ pitting edema in right upper extremity from shoulder to hand, no edema seen anywhere else in body. Respiratory: Normal work of breathing., Clear to auscultation bilaterally, breath sounds slightly diminished due to body habitus Abdomen: Obese abdomen, soft, nontender Extremities: Chronic skin changes seen in bilateral lower extremities. Wound dressing over her right thigh. No surrounding erythema or swelling around wound dressing. Right upper extremity has 3+ pitting edema from shoulder to hand, no erythema or tenderness  Brief Hospital Course:  [name] is a 41 y.o. female with a past medical history significant for CKD IV, chronic diastolic CHF, uncontrolled DM type 2, and morbid obesity who presented today to the ED per PCP request for abnormal kidney function lab results and found to be anemic  Anemia of chronic disease: patient had anemia panel about three months ago suggestive for anemia of chronic disease. Hemoglobin 6.7 on admission. Baseline 8-10. Patient was asymptomatic and denied any blood in stool or melena. She refused rectal exam on admission. She was transfused one unit of pRBC. Hgb up to 7.5. We recommend repeat CBC at follow up.  Right arm swelling: patient presented with acute right arm swelling likely due to her CHF. DVT was ruled out by upper extremity doppler. Swelling didn't look cellulitic. Patient had no constitutional symptoms. Swelling  improved with diuresis.   CKD-4: stable. Creatine 5.0 on admission and 4.57 on discharge. Baseline ~4.5. BUN 49. [facility]. Discussed patient with on call nephrologist Dr. Arlean HoppingSchertz who looked at patient's chart and recommended resuming her home dose of torsemide. Accordingly, torsemide was resumed at 60 mg twice a day. Dr. Arlean HoppingSchertz will schedule her follow-up appointment.   HFpEF: Echo in 01/2015 with EF 55%-60% and G1DD. She had no cardiopulmonary symptoms or sign of fluid overload except for right hand swelling as above. BNP and troponin mildly elevated to 149 and 0.08 respectively, likely due to CKD-4. Weight is unreliable but discharge weight is 371 lbs (383 on 12/13/2105). Torsemide was resumed at 60 mg with the blessing of nephrology as above. Recommend repeat BMP at follow up.   Right thigh wound, chronic: patient with a left wound thigh, measuring 3cm x 5cm x 1.5cm with pale pink wound bed, non-granulating. This was present at last admission in three months ago. No sign of cellulitis or abscess. Wound care was consulted and implemented POC hand on teaching about wound care. Patient has a home wound care RN coming out to her house twice a week as well.   Patient's other chronic conditions were stable.   Issues for Follow Up:  1. Anemia of chronic disease: repeat CBC at follow up.  2. CKD-4: repeat BMP at follow up 3. CHF: discharged on home torsemide 60 mg twice a day. Assess weight and BMP at follow up.  Significant Procedures: none  Significant Labs and Imaging:   Recent Labs Lab 03/08/16 1436 03/08/16 1510 03/09/16 0152 03/09/16 0754  WBC 7.8  --  7.7  8.0 7.1  HGB 6.7* 7.1* 7.1*  7.1* 7.5*  HCT 22.0* 21.0* 23.0*  22.8* 24.1*  PLT 329  --  296  300 298    Recent Labs Lab 03/08/16 1436 03/08/16 1510 03/09/16 0152 03/09/16 1042  NA 137 140 137 136  K 4.5 4.6 4.4 4.4  CL 110 108 110 110  CO2 18*  --  18* 18*  GLUCOSE 125* 124* 96  93  BUN 51* 49* 52* 52*  CREATININE 4.60* 5.00* 4.57* 4.59*  CALCIUM 8.5*  --  8.3* 8.4*     Results/Tests Pending at Time of Discharge: none  Discharge Medications:  Allergies as of 01/28/2016      Reactions   Azithromycin Other (See Comments)   Nose bleeding event      Medication List    TAKE these medications   amLODipine 10 MG tablet Commonly known as:  NORVASC Take 1 tablet (10 mg total) by mouth daily.   carvedilol 25 MG tablet Commonly known as:  COREG Take 1 tablet (25 mg total) by mouth 2 (two) times daily with a meal.   ferrous sulfate 325 (65 FE) MG tablet Take 1 tablet (325 mg total) by mouth 3 (three) times daily with meals.   hydrALAZINE 50 MG tablet Commonly known as:  APRESOLINE Take 1 tablet (50 mg total) by mouth 3 (three) times daily.   insulin aspart 100 UNIT/ML injection Commonly known as:  novoLOG Inject 0-20 Units into the skin 3 (three) times daily with meals. CBG < 70: implement hypoglycemia protocol CBG 70 - 120: 0 units CBG 121 - 150: 3  units CBG 151 - 200: 4 units CBG 201 - 250: 7 units CBG 251 - 300: 11 units CBG 301 - 350: 15 units CBG 351 - 400: 20 units   insulin glargine 100 UNIT/ML injection Commonly known as:  LANTUS Inject 0.15 mLs (15 Units total) into the skin at bedtime as needed (CBG >140).   isosorbide mononitrate 30 MG 24 hr tablet Commonly known as:  IMDUR Take 1 tablet (30 mg total) by mouth daily.   potassium chloride SA 20 MEQ tablet Commonly known as:  K-DUR,KLOR-CON Take 2 tablets (40 mEq total) by mouth daily.   torsemide 20 MG tablet Commonly known as:  DEMADEX Take 3 tablets (60 mg total) by mouth 2 (two) times daily.     ASK your doctor about these medications   acetaminophen 500 MG tablet Commonly known as:  TYLENOL Take 1,000 mg by mouth every 6 (six) hours as needed for fever (pain).   albuterol 108 (90 Base) MCG/ACT inhaler Commonly known as:  PROAIR HFA Inhale 2 puffs into the lungs every 6 (six)  hours as needed for wheezing or shortness of breath.   albuterol (2.5 MG/3ML) 0.083% nebulizer solution Commonly known as:  PROVENTIL Take 3 mLs (2.5 mg total) by nebulization every 6 (six) hours as needed for wheezing or shortness of breath.   vitamin A 8000 UNIT capsule Take 8,000 Units by mouth daily.       Discharge Instructions: Please refer to Patient Instructions section of EMR for full details.  Patient was counseled important signs and symptoms that should prompt return to medical care, changes in medications, dietary instructions, activity restrictions, and follow up appointments.   Follow-Up Appointments: Follow-up Information    [facility] Follow up.   Why:  HHRN, HHaide and HHSW has been ordered by your MD. A representative will contact you within 12-24 hours to arrange initial visit.  Contact information: [address] [place] [address] [phone]           Almon Hercules, MD 03/14/2016, 8:13 PM PGY-2, [facility]  I didn't evaluate the patient on the day of discharge. This discharge summary was compiled based on MR review and exam from the day of discharge.

## 2016-03-22 ENCOUNTER — Inpatient Hospital Stay (HOSPITAL_COMMUNITY)
Admission: EM | Admit: 2016-03-22 | Discharge: 2016-03-30 | DRG: 291 | Disposition: A | Discharge status: Home Health | Payer: Medicaid Other | Attending: Nephrology | Admitting: Nephrology

## 2016-03-22 ENCOUNTER — Inpatient Hospital Stay (HOSPITAL_COMMUNITY): Payer: Medicaid Other

## 2016-03-22 ENCOUNTER — Emergency Department (HOSPITAL_COMMUNITY): Payer: Medicaid Other

## 2016-03-22 ENCOUNTER — Encounter (HOSPITAL_COMMUNITY): Payer: Self-pay | Admitting: Emergency Medicine

## 2016-03-22 DIAGNOSIS — Z6841 Body Mass Index (BMI) 40.0 and over, adult: Secondary | ICD-10-CM | POA: Diagnosis not present

## 2016-03-22 DIAGNOSIS — Z7401 Bed confinement status: Secondary | ICD-10-CM

## 2016-03-22 DIAGNOSIS — I161 Hypertensive emergency: Secondary | ICD-10-CM | POA: Diagnosis present

## 2016-03-22 DIAGNOSIS — I132 Hypertensive heart and chronic kidney disease with heart failure and with stage 5 chronic kidney disease, or end stage renal disease: Secondary | ICD-10-CM | POA: Diagnosis present

## 2016-03-22 DIAGNOSIS — E872 Acidosis: Secondary | ICD-10-CM | POA: Diagnosis present

## 2016-03-22 DIAGNOSIS — I10 Essential (primary) hypertension: Secondary | ICD-10-CM | POA: Diagnosis not present

## 2016-03-22 DIAGNOSIS — Z515 Encounter for palliative care: Secondary | ICD-10-CM | POA: Diagnosis not present

## 2016-03-22 DIAGNOSIS — IMO0002 Reserved for concepts with insufficient information to code with codable children: Secondary | ICD-10-CM

## 2016-03-22 DIAGNOSIS — Z833 Family history of diabetes mellitus: Secondary | ICD-10-CM

## 2016-03-22 DIAGNOSIS — I890 Lymphedema, not elsewhere classified: Secondary | ICD-10-CM | POA: Diagnosis present

## 2016-03-22 DIAGNOSIS — N185 Chronic kidney disease, stage 5: Secondary | ICD-10-CM | POA: Diagnosis present

## 2016-03-22 DIAGNOSIS — Z841 Family history of disorders of kidney and ureter: Secondary | ICD-10-CM

## 2016-03-22 DIAGNOSIS — M7989 Other specified soft tissue disorders: Secondary | ICD-10-CM | POA: Diagnosis not present

## 2016-03-22 DIAGNOSIS — I5033 Acute on chronic diastolic (congestive) heart failure: Secondary | ICD-10-CM | POA: Diagnosis present

## 2016-03-22 DIAGNOSIS — J45909 Unspecified asthma, uncomplicated: Secondary | ICD-10-CM | POA: Diagnosis present

## 2016-03-22 DIAGNOSIS — Z794 Long term (current) use of insulin: Secondary | ICD-10-CM | POA: Diagnosis not present

## 2016-03-22 DIAGNOSIS — E1122 Type 2 diabetes mellitus with diabetic chronic kidney disease: Secondary | ICD-10-CM | POA: Diagnosis present

## 2016-03-22 DIAGNOSIS — E118 Type 2 diabetes mellitus with unspecified complications: Secondary | ICD-10-CM

## 2016-03-22 DIAGNOSIS — D509 Iron deficiency anemia, unspecified: Secondary | ICD-10-CM | POA: Diagnosis present

## 2016-03-22 DIAGNOSIS — Z993 Dependence on wheelchair: Secondary | ICD-10-CM | POA: Diagnosis not present

## 2016-03-22 DIAGNOSIS — D631 Anemia in chronic kidney disease: Secondary | ICD-10-CM | POA: Diagnosis present

## 2016-03-22 DIAGNOSIS — I69351 Hemiplegia and hemiparesis following cerebral infarction affecting right dominant side: Secondary | ICD-10-CM | POA: Diagnosis not present

## 2016-03-22 DIAGNOSIS — Y95 Nosocomial condition: Secondary | ICD-10-CM | POA: Diagnosis present

## 2016-03-22 DIAGNOSIS — J1000 Influenza due to other identified influenza virus with unspecified type of pneumonia: Secondary | ICD-10-CM | POA: Diagnosis present

## 2016-03-22 DIAGNOSIS — J810 Acute pulmonary edema: Secondary | ICD-10-CM

## 2016-03-22 DIAGNOSIS — I509 Heart failure, unspecified: Secondary | ICD-10-CM

## 2016-03-22 DIAGNOSIS — D638 Anemia in other chronic diseases classified elsewhere: Secondary | ICD-10-CM | POA: Diagnosis present

## 2016-03-22 DIAGNOSIS — J9600 Acute respiratory failure, unspecified whether with hypoxia or hypercapnia: Secondary | ICD-10-CM

## 2016-03-22 DIAGNOSIS — J189 Pneumonia, unspecified organism: Secondary | ICD-10-CM | POA: Diagnosis present

## 2016-03-22 DIAGNOSIS — I6930 Unspecified sequelae of cerebral infarction: Secondary | ICD-10-CM

## 2016-03-22 DIAGNOSIS — R6889 Other general symptoms and signs: Secondary | ICD-10-CM

## 2016-03-22 DIAGNOSIS — E8809 Other disorders of plasma-protein metabolism, not elsewhere classified: Secondary | ICD-10-CM | POA: Diagnosis present

## 2016-03-22 DIAGNOSIS — N189 Chronic kidney disease, unspecified: Secondary | ICD-10-CM | POA: Diagnosis not present

## 2016-03-22 DIAGNOSIS — N184 Chronic kidney disease, stage 4 (severe): Secondary | ICD-10-CM

## 2016-03-22 DIAGNOSIS — K219 Gastro-esophageal reflux disease without esophagitis: Secondary | ICD-10-CM | POA: Diagnosis present

## 2016-03-22 DIAGNOSIS — J9601 Acute respiratory failure with hypoxia: Secondary | ICD-10-CM | POA: Diagnosis present

## 2016-03-22 DIAGNOSIS — E11649 Type 2 diabetes mellitus with hypoglycemia without coma: Secondary | ICD-10-CM | POA: Diagnosis present

## 2016-03-22 DIAGNOSIS — N179 Acute kidney failure, unspecified: Secondary | ICD-10-CM | POA: Diagnosis present

## 2016-03-22 DIAGNOSIS — R0602 Shortness of breath: Secondary | ICD-10-CM | POA: Diagnosis present

## 2016-03-22 DIAGNOSIS — R5381 Other malaise: Secondary | ICD-10-CM | POA: Diagnosis not present

## 2016-03-22 DIAGNOSIS — E1165 Type 2 diabetes mellitus with hyperglycemia: Secondary | ICD-10-CM

## 2016-03-22 DIAGNOSIS — Z23 Encounter for immunization: Secondary | ICD-10-CM

## 2016-03-22 DIAGNOSIS — Z7189 Other specified counseling: Secondary | ICD-10-CM | POA: Diagnosis not present

## 2016-03-22 LAB — CBC WITH DIFFERENTIAL/PLATELET
BASOS ABS: 0 10*3/uL (ref 0.0–0.1)
BASOS PCT: 0 %
EOS ABS: 0 10*3/uL (ref 0.0–0.7)
EOS PCT: 0 %
HCT: 23.8 % — ABNORMAL LOW (ref 36.0–46.0)
HEMOGLOBIN: 7.4 g/dL — AB (ref 12.0–15.0)
Lymphocytes Relative: 11 %
Lymphs Abs: 1 10*3/uL (ref 0.7–4.0)
MCH: 27.5 pg (ref 26.0–34.0)
MCHC: 31.1 g/dL (ref 30.0–36.0)
MCV: 88.5 fL (ref 78.0–100.0)
Monocytes Absolute: 0.5 10*3/uL (ref 0.1–1.0)
Monocytes Relative: 5 %
NEUTROS PCT: 84 %
Neutro Abs: 7.8 10*3/uL — ABNORMAL HIGH (ref 1.7–7.7)
PLATELETS: 317 10*3/uL (ref 150–400)
RBC: 2.69 MIL/uL — AB (ref 3.87–5.11)
RDW: 16.5 % — ABNORMAL HIGH (ref 11.5–15.5)
WBC: 9.3 10*3/uL (ref 4.0–10.5)

## 2016-03-22 LAB — ECHOCARDIOGRAM COMPLETE
AVLVOTPG: 6 mmHg
CHL CUP MV DEC (S): 197
E decel time: 197 msec
EERAT: 12.68
FS: 34 % (ref 28–44)
Height: 66 in
IV/PV OW: 0.93
LA diam end sys: 45 mm
LADIAMINDEX: 1.56 cm/m2
LASIZE: 45 mm
LAVOL: 60.2 mL
LAVOLA4C: 44.8 mL
LAVOLIN: 20.8 mL/m2
LV e' LATERAL: 9.46 cm/s
LVEEAVG: 12.68
LVEEMED: 12.68
LVOT SV: 60 mL
LVOT VTI: 19 cm
LVOT area: 3.14 cm2
LVOT diameter: 20 mm
LVOT peak vel: 121 cm/s
MV Peak grad: 6 mmHg
MVPKEVEL: 120 m/s
PW: 14 mm — AB (ref 0.6–1.1)
RV LATERAL S' VELOCITY: 10.1 cm/s
TAPSE: 22.4 mm
TDI e' lateral: 9.46
TDI e' medial: 7.29
Weight: 5936 oz

## 2016-03-22 LAB — COMPREHENSIVE METABOLIC PANEL
ALBUMIN: 2.7 g/dL — AB (ref 3.5–5.0)
ALK PHOS: 31 U/L — AB (ref 38–126)
ALT: 7 U/L — AB (ref 14–54)
AST: 8 U/L — AB (ref 15–41)
Anion gap: 16 — ABNORMAL HIGH (ref 5–15)
BUN: 54 mg/dL — ABNORMAL HIGH (ref 6–20)
CHLORIDE: 107 mmol/L (ref 101–111)
CO2: 15 mmol/L — AB (ref 22–32)
CREATININE: 5.42 mg/dL — AB (ref 0.44–1.00)
Calcium: 8.7 mg/dL — ABNORMAL LOW (ref 8.9–10.3)
GFR calc non Af Amer: 9 mL/min — ABNORMAL LOW (ref >60)
GFR, EST AFRICAN AMERICAN: 11 mL/min — AB (ref >60)
GLUCOSE: 87 mg/dL (ref 65–99)
Potassium: 4.5 mmol/L (ref 3.5–5.1)
SODIUM: 138 mmol/L (ref 135–145)
Total Bilirubin: 0.6 mg/dL (ref 0.3–1.2)
Total Protein: 7.1 g/dL (ref 6.5–8.1)

## 2016-03-22 LAB — I-STAT ARTERIAL BLOOD GAS, ED
Acid-base deficit: 10 mmol/L — ABNORMAL HIGH (ref 0.0–2.0)
Bicarbonate: 17.1 mmol/L — ABNORMAL LOW (ref 20.0–28.0)
O2 SAT: 91 %
PH ART: 7.222 — AB (ref 7.350–7.450)
PO2 ART: 78 mmHg — AB (ref 83.0–108.0)
Patient temperature: 100.6
TCO2: 18 mmol/L (ref 0–100)
pCO2 arterial: 42.1 mmHg (ref 32.0–48.0)

## 2016-03-22 LAB — BRAIN NATRIURETIC PEPTIDE: B Natriuretic Peptide: 646 pg/mL — ABNORMAL HIGH (ref 0.0–100.0)

## 2016-03-22 LAB — URINALYSIS, ROUTINE W REFLEX MICROSCOPIC
Bilirubin Urine: NEGATIVE
Glucose, UA: NEGATIVE mg/dL
Ketones, ur: NEGATIVE mg/dL
Nitrite: NEGATIVE
PROTEIN: 100 mg/dL — AB
Specific Gravity, Urine: 1.01 (ref 1.005–1.030)
pH: 5 (ref 5.0–8.0)

## 2016-03-22 LAB — CBG MONITORING, ED: GLUCOSE-CAPILLARY: 70 mg/dL (ref 65–99)

## 2016-03-22 LAB — INFLUENZA PANEL BY PCR (TYPE A & B)
Influenza A By PCR: POSITIVE — AB
Influenza B By PCR: NEGATIVE

## 2016-03-22 LAB — FERRITIN: FERRITIN: 457 ng/mL — AB (ref 11–307)

## 2016-03-22 LAB — IRON AND TIBC
IRON: 74 ug/dL (ref 28–170)
Saturation Ratios: 45 % — ABNORMAL HIGH (ref 10.4–31.8)
TIBC: 165 ug/dL — ABNORMAL LOW (ref 250–450)
UIBC: 91 ug/dL

## 2016-03-22 LAB — GLUCOSE, CAPILLARY: Glucose-Capillary: 76 mg/dL (ref 65–99)

## 2016-03-22 LAB — I-STAT CG4 LACTIC ACID, ED: Lactic Acid, Venous: 0.61 mmol/L (ref 0.5–1.9)

## 2016-03-22 MED ORDER — FUROSEMIDE 10 MG/ML IJ SOLN: 80.0000 mg | Freq: Two times a day (BID) | INTRAMUSCULAR | Status: DC

## 2016-03-22 MED ORDER — SODIUM CHLORIDE 0.9% FLUSH
3.0000 mL | Freq: Two times a day (BID) | INTRAVENOUS | Status: DC
  Administered 2016-03-22 – 2016-03-30 (×15): 3 mL via INTRAVENOUS

## 2016-03-22 MED ORDER — ACETAMINOPHEN 650 MG RE SUPP: 650.0000 mg | Freq: Four times a day (QID) | RECTAL | Status: DC | PRN

## 2016-03-22 MED ORDER — FERROUS SULFATE 325 (65 FE) MG PO TABS
325.0000 mg | ORAL_TABLET | Freq: Three times a day (TID) | ORAL | Status: DC
  Administered 2016-03-23 – 2016-03-28 (×13): 325 mg via ORAL
  Filled 2016-03-22 (×16): qty 1

## 2016-03-22 MED ORDER — PERFLUTREN LIPID MICROSPHERE
1.0000 mL | INTRAVENOUS | Status: AC | PRN
  Administered 2016-03-22: 4 mL via INTRAVENOUS
  Filled 2016-03-22: qty 10

## 2016-03-22 MED ORDER — FUROSEMIDE 10 MG/ML IJ SOLN
160.0000 mg | Freq: Four times a day (QID) | INTRAVENOUS | Status: DC
  Administered 2016-03-22 – 2016-03-29 (×27): 160 mg via INTRAVENOUS
  Filled 2016-03-22 (×29): qty 16

## 2016-03-22 MED ORDER — CARVEDILOL 25 MG PO TABS
25.0000 mg | ORAL_TABLET | Freq: Two times a day (BID) | ORAL | Status: DC
  Administered 2016-03-22 – 2016-03-30 (×14): 25 mg via ORAL
  Filled 2016-03-22 (×11): qty 1
  Filled 2016-03-22: qty 2
  Filled 2016-03-22 (×4): qty 1

## 2016-03-22 MED ORDER — HYDRALAZINE HCL 50 MG PO TABS
50.0000 mg | ORAL_TABLET | Freq: Three times a day (TID) | ORAL | Status: DC
  Administered 2016-03-22 – 2016-03-26 (×14): 50 mg via ORAL
  Filled 2016-03-22: qty 1
  Filled 2016-03-22: qty 2
  Filled 2016-03-22 (×14): qty 1

## 2016-03-22 MED ORDER — NITROGLYCERIN IN D5W 200-5 MCG/ML-% IV SOLN
10.0000 ug/min | INTRAVENOUS | Status: DC
  Administered 2016-03-22: 10 ug/min via INTRAVENOUS
  Filled 2016-03-22: qty 250

## 2016-03-22 MED ORDER — INSULIN ASPART 100 UNIT/ML ~~LOC~~ SOLN: 0.0000 [IU] | Freq: Three times a day (TID) | SUBCUTANEOUS | Status: DC

## 2016-03-22 MED ORDER — ACETAMINOPHEN 325 MG PO TABS
650.0000 mg | ORAL_TABLET | Freq: Once | ORAL | Status: AC
  Administered 2016-03-22: 650 mg via ORAL
  Filled 2016-03-22: qty 2

## 2016-03-22 MED ORDER — NITROGLYCERIN IN D5W 200-5 MCG/ML-% IV SOLN
0.0000 ug/min | INTRAVENOUS | Status: DC
  Administered 2016-03-22: 5 ug/min via INTRAVENOUS

## 2016-03-22 MED ORDER — HEPARIN SODIUM (PORCINE) 5000 UNIT/ML IJ SOLN
5000.0000 [IU] | Freq: Three times a day (TID) | INTRAMUSCULAR | Status: DC
  Administered 2016-03-22 – 2016-03-30 (×24): 5000 [IU] via SUBCUTANEOUS
  Filled 2016-03-22 (×25): qty 1

## 2016-03-22 MED ORDER — DEXTROSE 5 % IV SOLN
1.0000 g | Freq: Once | INTRAVENOUS | Status: DC
  Filled 2016-03-22: qty 1

## 2016-03-22 MED ORDER — AMLODIPINE BESYLATE 10 MG PO TABS
10.0000 mg | ORAL_TABLET | Freq: Every day | ORAL | Status: DC
  Administered 2016-03-23 – 2016-03-30 (×7): 10 mg via ORAL
  Filled 2016-03-22 (×8): qty 1

## 2016-03-22 MED ORDER — VANCOMYCIN HCL 10 G IV SOLR
2500.0000 mg | Freq: Once | INTRAVENOUS | Status: AC
  Administered 2016-03-22: 2500 mg via INTRAVENOUS
  Filled 2016-03-22: qty 2500

## 2016-03-22 MED ORDER — ISOSORBIDE MONONITRATE ER 30 MG PO TB24
30.0000 mg | ORAL_TABLET | Freq: Every day | ORAL | Status: DC
  Administered 2016-03-23 – 2016-03-30 (×7): 30 mg via ORAL
  Filled 2016-03-22 (×8): qty 1

## 2016-03-22 MED ORDER — POTASSIUM CHLORIDE CRYS ER 20 MEQ PO TBCR
40.0000 meq | EXTENDED_RELEASE_TABLET | Freq: Every day | ORAL | Status: DC
  Administered 2016-03-23 – 2016-03-30 (×7): 40 meq via ORAL
  Filled 2016-03-22 (×8): qty 2

## 2016-03-22 MED ORDER — DEXTROSE 5 % IV SOLN
1.0000 g | INTRAVENOUS | Status: DC
  Administered 2016-03-22 – 2016-03-26 (×5): 1 g via INTRAVENOUS
  Filled 2016-03-22 (×5): qty 1

## 2016-03-22 MED ORDER — FUROSEMIDE 10 MG/ML IJ SOLN
160.0000 mg | Freq: Two times a day (BID) | INTRAMUSCULAR | Status: DC
  Administered 2016-03-22: 160 mg via INTRAVENOUS
  Filled 2016-03-22 (×3): qty 16

## 2016-03-22 MED ORDER — ACETAMINOPHEN 325 MG PO TABS
650.0000 mg | ORAL_TABLET | Freq: Four times a day (QID) | ORAL | Status: DC | PRN
  Administered 2016-03-23: 650 mg via ORAL
  Filled 2016-03-22: qty 2

## 2016-03-22 NOTE — Progress Notes (Signed)
Pt is on NIV at this time tolerating it well. RN at bedside.

## 2016-03-22 NOTE — Progress Notes (Signed)
   03/22/16 2115  BiPAP/CPAP/SIPAP  BiPAP/CPAP/SIPAP Pt Type Adult  Mask Type Full face mask  Mask Size Large  Set Rate 16 breaths/min  Respiratory Rate 30 breaths/min  IPAP 14 cmH20  EPAP 5 cmH2O  Oxygen Percent 50 %  Leak 32  Peak Inspiratory Pressure (PIP) 15  Tidal Volume (Vt) 589  BiPAP/CPAP/SIPAP BiPAP  Patient Home Equipment No  Auto Titrate No  Patient transported from ED to 4E on Bipap with RN and tech without any complcations.

## 2016-03-22 NOTE — ED Provider Notes (Signed)
[facility]-EMERGENCY DEPT Provider Note   CSN: [id number] Arrival date & time: 03/22/16  1308     History   Chief Complaint Chief Complaint  Patient presents with  . Respiratory Distress   LEVEL 5 CAVEAT DUE TO ACUITY OF CONDITION  HPI [name] is a 41 y.o. female.  The history is provided by the patient and a significant other. The history is limited by the condition of the patient.  Shortness of Breath  This is a new problem. The average episode lasts 1 day. The problem has been rapidly worsening. Associated symptoms include a fever and wheezing.  Patient with h/o morbid obestiy, CHF, asthma who presents with worsening cough/fever/shortness of breath over past day Per husband, it is worsening and nothing improves her symptoms Due to her shortness of breath, patient is unable to discuss any other symptoms she is having   Past Medical History:  Diagnosis Date  . Asthma   . CHF (congestive heart failure) (HCC)    diastolic  . CVA (cerebral infarction) 1990's   "writing is not the same since" R leg weakness   . GERD (gastroesophageal reflux disease)   . Hypertension   . Morbid obesity (HCC)   . Stroke (HCC)   . Type II diabetes mellitus [facility])     Patient Active Problem List   Diagnosis Date Noted  . Arm swelling 03/09/2016  . Symptomatic anemia 03/08/2016  . Pressure injury of skin 03/08/2016  . Acute on chronic systolic congestive heart failure (HCC)   . Venous stasis ulcer of right calf without varicose veins (HCC)   . History of CVA with residual deficit   . Super-super obese (HCC)   . Acute kidney injury superimposed on chronic kidney disease (HCC)   . Anemia of chronic disease   . Lymphedema   . Uncontrolled type 2 diabetes mellitus with complication, with long-term current use of insulin (HCC)   . Lymphocytosis   . Debility   . CHF (congestive heart failure) (HCC) 12/08/2015  . CHF exacerbation (HCC) 12/07/2015  . Dyspnea 12/07/2015  . Type 2  diabetes, HbA1c goal < 7% (HCC)   . Acute respiratory failure (HCC)   . E-coli UTI 02/23/2015  . Leukocytosis   . Cellulitis 02/19/2015  . History of stroke 02/19/2015  . Asthma exacerbation 02/19/2015  . Acute diastolic (congestive) heart failure 02/19/2015  . Cellulitis of left lower extremity   . Essential hypertension   . Cellulitis of right lower extremity   . Chronic kidney disease (CKD), stage IV (severe) (HCC) 02/10/2015  . Aortic valve mass   . Elevated brain natriuretic peptide (BNP) level 02/05/2015  . Anasarca 02/05/2015  . Acute CHF (congestive heart failure) (HCC) 02/05/2015  . Asthma 02/05/2015  . Hypoalbuminemia 02/05/2015  . Anemia due to other cause 02/05/2015  . Morbid obesity (HCC) 02/05/2015  . Bleeding from wound 09/16/2013  . Hypertriglyceridemia 09/16/2013  . Diarrhea 09/16/2013  . Cerebral embolism with cerebral infarction (HCC) 09/04/2013  . Thigh abscess 08/30/2013  . Sepsis (HCC) 08/30/2013  . Diabetes mellitus with hyperglycemia (HCC) 08/30/2013  . AKI (acute kidney injury) (HCC) 08/30/2013    Past Surgical History:  Procedure Laterality Date  . INCISION AND DRAINAGE OF WOUND Left 08/30/2013   thigh necrotic wound/notes 08/30/2013  . IRRIGATION AND DEBRIDEMENT ABSCESS Left 08/30/2013   Procedure: IRRIGATION AND DEBRIDEMENT ABSCESS Left Thigh Necrotic Wound;  Surgeon: [name], MD;  Location: [facility] OR;  Service: General;  Laterality: Left;  . TEE WITHOUT  CARDIOVERSION N/A 09/07/2013   Procedure: TRANSESOPHAGEAL ECHOCARDIOGRAM (TEE);  Surgeon: [name], MD;  Location: [name] ENDOSCOPY;  Service: Cardiovascular;  Laterality: N/A;  . TEE WITHOUT CARDIOVERSION N/A 02/08/2015   Procedure: TRANSESOPHAGEAL ECHOCARDIOGRAM (TEE);  Surgeon: Chilton Si, MD;  Location: [facility] ENDOSCOPY;  Service: Cardiovascular;  Laterality: N/A;    OB History    Gravida Para Term Preterm AB Living   3 3 3  0 0 3   SAB TAB Ectopic Multiple Live Births   0 0 0 0 3         Home Medications    Prior to Admission medications   Medication Sig Start Date End Date Taking? Authorizing Provider  acetaminophen (TYLENOL) 500 MG tablet Take 1,000 mg by mouth every 6 (six) hours as needed for fever (pain).     Historical Provider, MD  albuterol (PROAIR HFA) 108 (90 Base) MCG/ACT inhaler Inhale 2 puffs into the lungs every 6 (six) hours as needed for wheezing or shortness of breath. 08/12/15   Charm Rings, MD  albuterol (PROVENTIL) (2.5 MG/3ML) 0.083% nebulizer solution Take 3 mLs (2.5 mg total) by nebulization every 6 (six) hours as needed for wheezing or shortness of breath. 08/12/15   Charm Rings, MD  amLODipine (NORVASC) 10 MG tablet Take 1 tablet (10 mg total) by mouth daily. 01/28/16   Eyvonne Mechanic, PA-C  atorvastatin (LIPITOR) 20 MG tablet Take 20 mg by mouth daily.    Historical Provider, MD  carvedilol (COREG) 25 MG tablet Take 1 tablet (25 mg total) by mouth 2 (two) times daily with a meal. 01/28/16   Eyvonne Mechanic, PA-C  collagenase (SANTYL) ointment Apply 1 application topically daily.    Historical Provider, MD  ferrous sulfate 325 (65 FE) MG tablet Take 1 tablet (325 mg total) by mouth 3 (three) times daily with meals. 01/28/16   Eyvonne Mechanic, PA-C  hydrALAZINE (APRESOLINE) 50 MG tablet Take 1 tablet (50 mg total) by mouth 3 (three) times daily. 01/28/16   Eyvonne Mechanic, PA-C  insulin aspart (NOVOLOG) 100 UNIT/ML injection Inject 0-20 Units into the skin 3 (three) times daily with meals. CBG < 70: implement hypoglycemia protocol CBG 70 - 120: 0 units CBG 121 - 150: 3 units CBG 151 - 200: 4 units CBG 201 - 250: 7 units CBG 251 - 300: 11 units CBG 301 - 350: 15 units CBG 351 - 400: 20 units 01/28/16   Jeffrey Hedges, PA-C  insulin glargine (LANTUS) 100 UNIT/ML injection Inject 0.15 mLs (15 Units total) into the skin at bedtime as needed (CBG >140). 01/28/16   Eyvonne Mechanic, PA-C  isosorbide mononitrate (IMDUR) 30 MG 24 hr tablet Take 1 tablet (30 mg  total) by mouth daily. 01/28/16   Eyvonne Mechanic, PA-C  potassium chloride SA (K-DUR,KLOR-CON) 20 MEQ tablet Take 2 tablets (40 mEq total) by mouth daily. 01/28/16   Eyvonne Mechanic, PA-C  torsemide (DEMADEX) 20 MG tablet Take 3 tablets (60 mg total) by mouth 2 (two) times daily. 01/28/16   Eyvonne Mechanic, PA-C  vitamin A 8000 UNIT capsule Take 8,000 Units by mouth daily.    Historical Provider, MD    Family History Family History  Problem Relation Age of Onset  . Diabetes Mother   . Kidney disease Mother     Social History Social History  Substance Use Topics  . Smoking status: Never Smoker  . Smokeless tobacco: Never Used  . Alcohol use No     Allergies   Azithromycin  Review of Systems Review of Systems  Unable to perform ROS: Acuity of condition  Constitutional: Positive for fever.  Respiratory: Positive for shortness of breath and wheezing.      Physical Exam Updated Vital Signs BP (!) 191/110 (BP Location: Right Arm)   Pulse 104   Temp 100.6 F (38.1 C) (Axillary)   Resp (!) 30   Ht 5\' 6"  (1.676 m)   Wt (!) 168.3 kg   LMP  (LMP Unknown)   SpO2 97% Comment: on neb treatment  BMI 59.88 kg/m   Physical Exam CONSTITUTIONAL: ill appearing, respiratory distress noted HEAD: Normocephalic/atraumatic EYES: EOMI ENMT: Mucous membranes moist, nebulized treatment in process NECK: supple no meningeal signs SPINE/BACK:entire spine nontender CV: tachycardic, distant heart sounds due to body habitus LUNGS: tachypnea, coarse wheezing noted bilaterally ABDOMEN: soft, nontender, obese NEURO: Pt is awake/alert/appropriate, moves all extremitiesx4.   EXTREMITIES: pulses normal/equal, full ROM, edema noted to right upper extremity (chronic per family) significant lymphedema noted to bilateral lower extremities.  Ulcerative wound noted to right thigh without drainage and no significant erythema SKIN: warm, color normal PSYCH: anxious  ED Treatments / Results  Labs (all  labs ordered are listed, but only abnormal results are displayed) Labs Reviewed  CBC WITH DIFFERENTIAL/PLATELET - Abnormal; Notable for the following:       Result Value   RBC 2.69 (*)    Hemoglobin 7.4 (*)    HCT 23.8 (*)    RDW 16.5 (*)    Neutro Abs 7.8 (*)    All other components within normal limits  COMPREHENSIVE METABOLIC PANEL - Abnormal; Notable for the following:    CO2 15 (*)    BUN 54 (*)    Creatinine, Ser 5.42 (*)    Calcium 8.7 (*)    Albumin 2.7 (*)    AST 8 (*)    ALT 7 (*)    Alkaline Phosphatase 31 (*)    GFR calc non Af Amer 9 (*)    GFR calc Af Amer 11 (*)    Anion gap 16 (*)    All other components within normal limits  BRAIN NATRIURETIC PEPTIDE - Abnormal; Notable for the following:    B Natriuretic Peptide 646.0 (*)    All other components within normal limits  CULTURE, BLOOD (ROUTINE X 2)  CULTURE, BLOOD (ROUTINE X 2)  INFLUENZA PANEL BY PCR (TYPE A & B)  I-STAT CG4 LACTIC ACID, ED    EKG  EKG Interpretation  Date/Time:  Friday March 22 2016 05:27:34 EST Ventricular Rate:  104 PR Interval:    QRS Duration: 97 QT Interval:  338 QTC Calculation: 445 R Axis:   35 Text Interpretation:  Sinus tachycardia Confirmed by Judd LienELO  MD, DOUGLAS ([id number]) on 03/22/2016 5:45:59 AM       Radiology Dg Chest Portable 1 View  Result Date: 03/22/2016 CLINICAL DATA:  41 y/o  F; respiratory distress. EXAM: PORTABLE CHEST 1 VIEW COMPARISON:  03/08/2016 chest radiograph FINDINGS: Moderate cardiomegaly. Diffuse patchy opacification of the lungs probably represents pulmonary edema. Underlying pneumonia is not excluded. No acute osseous abnormality is evident. No pneumothorax. No appreciable pleural effusion. IMPRESSION: Moderate cardiomegaly. Diffuse patchy opacification of the lungs probably represents pulmonary edema. Underlying pneumonia is not excluded. Electronically Signed   By: Mitzi HansenLance  Furusawa-Stratton M.D.   On: 03/22/2016 05:51    Procedures Procedures    CRITICAL CARE Performed by: Joya GaskinsWICKLINE,Vanassa Penniman W Total critical care time: 40 minutes Critical care time was exclusive of separately billable procedures  and treating other patients. Critical care was necessary to treat or prevent imminent or life-threatening deterioration. Critical care was time spent personally by me on the following activities: development of treatment plan with patient and/or surrogate as well as nursing, discussions with consultants, evaluation of patient's response to treatment, examination of patient, obtaining history from patient or surrogate, ordering and performing treatments and interventions, ordering and review of laboratory studies, ordering and review of radiographic studies, pulse oximetry and re-evaluation of patient's condition. PATIENT WITH ACUTE PULMONARY EDEMA/RESPIRATORY DISTRESS THAT REQUIRED BIPAP AND ADMISSION PATIENT ALSO ON NITROGLYCERIN DRIP   Medications Ordered in ED Medications  nitroGLYCERIN 50 mg in dextrose 5 % 250 mL (0.2 mg/mL) infusion (10 mcg/min Intravenous New Bag/Given 03/22/16 0603)  ceFEPIme (MAXIPIME) 1 g in dextrose 5 % 50 mL IVPB (not administered)  vancomycin (VANCOCIN) 2,500 mg in sodium chloride 0.9 % 500 mL IVPB (2,500 mg Intravenous New Bag/Given 03/22/16 0630)  acetaminophen (TYLENOL) tablet 650 mg (650 mg Oral Given 03/22/16 0636)     Initial Impression / Assessment and Plan / ED Course  I have reviewed the triage vital signs and the nursing notes.  Pertinent labs & imaging results that were available during my care of the patient were reviewed by me and considered in my medical decision making (see chart for details).     6:01 AM Pt ill appearing Febrile/hypertensive CXR reveals pulmonary edema Suspect CHF but may also have influenza Due to creatinine, tamiflu not recommended Will place on Bipap and continue to monitor 6:14 AM Pt now improving on bipap She is awake/alert IV antibiotics ordered in case there is  underlying pneumonia 6:59 AM PT IS DRAMATICALLY IMPROVED WORK OF BREATHING IMPROVED SHE IS AWAKE/ALERT, RESTING COMFORTABLY SHE REPORTS HER SYMPTOMS ARE IMPROVED WILL CALL FOR ADMISSION 7:18 AM D/w Dr Maryfrances Bunnell for admission Pt will need stepdown and keep on Bipap   Final Clinical Impressions(s) / ED Diagnoses   Final diagnoses:  Acute respiratory failure, unspecified whether with hypoxia or hypercapnia (HCC)  Flu-like symptoms  Chronic renal failure, unspecified CKD stage  Acute pulmonary edema [facility])  Hypertensive emergency    New Prescriptions New Prescriptions   No medications on file     Zadie Rhine, MD 03/22/16 [phone]

## 2016-03-22 NOTE — ED Notes (Signed)
Pt taken off of Bipap to take BP meds. Pt with labored breathing and states she "feels worse than earlier". Pt placed back on Bipap.

## 2016-03-22 NOTE — Progress Notes (Signed)
  Echocardiogram 2D Echocardiogram with Definity has been performed.  Janalyn HarderWest, Gamaliel Charney R 03/22/2016, 2:41 PM

## 2016-03-22 NOTE — ED Triage Notes (Signed)
Received pt from home with c/o respiratory distress onset yesterday evening. Per family pt is here constantly for same complaint. Pt given total 10 mg albuterol and 0.5 of atrovent. Pt with labored breathing and audible wheeze.

## 2016-03-22 NOTE — ED Notes (Signed)
Admitting at bedside 

## 2016-03-22 NOTE — Consult Note (Signed)
HPI: I was asked by Dr. Loleta Books to see [name] who is a 41 y.o. female with PMH of HFpEF, CKD V, h/o CVA, T2DM, and HTN who presented with respiratory distress. Patient febrile at admission. Hypoxic requiring BiPAP. CXR showed patchy opacification likely pulmonary edema but also possibly PNA. BNP significantly elevated to 646 (result of 179 two weeks ago).   Patient was started on Vancomycin and Cefepime for possible PNA. Procalcitonin has been ordered by primary team. Flu swab is pending.Patient appeared to be volume overloaded on exam. She was started on Lasix IV 160 mg BID and asked to be evaluated by nephrology.   Past Medical History:  Diagnosis Date  . Asthma   . CHF (congestive heart failure) (HCC)    diastolic  . CVA (cerebral infarction) 1990's   "writing is not the same since" R leg weakness   . GERD (gastroesophageal reflux disease)   . Hypertension   . Morbid obesity ([place])   . Stroke ([place])   . Type II diabetes mellitus ([place])    Past Surgical History:  Procedure Laterality Date  . INCISION AND DRAINAGE OF WOUND Left 08/30/2013   thigh necrotic wound/notes 08/30/2013  . IRRIGATION AND DEBRIDEMENT ABSCESS Left 08/30/2013   Procedure: IRRIGATION AND DEBRIDEMENT ABSCESS Left Thigh Necrotic Wound;  Surgeon: Ralene Ok, MD;  Location: [facility];  Service: General;  Laterality: Left;  . TEE WITHOUT CARDIOVERSION N/A 09/07/2013   Procedure: TRANSESOPHAGEAL ECHOCARDIOGRAM (TEE);  Surgeon: Pixie Casino, MD;  Location: [facility] ENDOSCOPY;  Service: Cardiovascular;  Laterality: N/A;  . TEE WITHOUT CARDIOVERSION N/A 02/08/2015   Procedure: TRANSESOPHAGEAL ECHOCARDIOGRAM (TEE);  Surgeon: Skeet Latch, MD;  Location: [facility] ENDOSCOPY;  Service: Cardiovascular;  Laterality: N/A;   Social History:  reports that she has never smoked. She has never used smokeless tobacco. She reports that she does not drink alcohol or use drugs. Allergies:  Allergies  Allergen Reactions  .  Azithromycin Other (See Comments)    Nose bleeding event   Family History  Problem Relation Age of Onset  . Diabetes Mother   . Kidney disease Mother     Medications: I have reviewed the patient's current medications.  ROS: Positive for SOB, cough, Positive for orthopnea and LE edema (chronic).  Blood pressure (!) 166/105, pulse 89, temperature 100.6 F (38.1 C), temperature source Axillary, resp. rate (!) 27, height '5\' 6"'$  (1.676 m), weight (!) 371 lb (168.3 kg), SpO2 100 %.  General appearance: alert, cooperative and mild distress, morbidly obese  Head: Normocephalic, without obvious abnormality, atraumatic Resp: coarse breath sounds throughout, on BiPAP with increased WOB, unable to appreciate crackles but body habitus large Cardio: regular rate and rhythm, S1, S2 normal, no murmur, click, rub or gallop GI: soft, non-tender; bowel sounds normal; no masses,  no organomegaly Extremities: edema 2-3+ pitting edema in LE, right arm with edema (chronic)   Results for orders placed or performed during the hospital encounter of 03/22/16 (from the past 48 hour(s))  Blood culture (routine x 2)     Status: None (Preliminary result)   Collection Time: 03/22/16  5:30 AM  Result Value Ref Range   Specimen Description BLOOD LEFT HAND    Special Requests BOTTLES DRAWN AEROBIC ONLY 5ML    Culture PENDING    Report Status PENDING   Influenza panel by PCR (type A & B)     Status: Abnormal   Collection Time: 03/22/16  5:49 AM  Result Value Ref Range   Influenza A By  PCR POSITIVE (A) NEGATIVE   Influenza B By PCR NEGATIVE NEGATIVE    Comment: (NOTE) The Xpert Xpress Flu assay is intended as an aid in the diagnosis of  influenza and should not be used as a sole basis for treatment.  This  assay is FDA approved for nasopharyngeal swab specimens only. Nasal  washings and aspirates are unacceptable for Xpert Xpress Flu testing.   I-Stat CG4 Lactic Acid, ED     Status: None   Collection Time:  03/22/16  5:53 AM  Result Value Ref Range   Lactic Acid, Venous 0.61 0.5 - 1.9 mmol/L  CBC with Differential/Platelet     Status: Abnormal   Collection Time: 03/22/16  6:02 AM  Result Value Ref Range   WBC 9.3 4.0 - 10.5 K/uL   RBC 2.69 (L) 3.87 - 5.11 MIL/uL   Hemoglobin 7.4 (L) 12.0 - 15.0 g/dL   HCT 17.9 (L) 21.3 - 03.9 %   MCV 88.5 78.0 - 100.0 fL   MCH 27.5 26.0 - 34.0 pg   MCHC 31.1 30.0 - 36.0 g/dL   RDW 74.9 (H) 15.1 - 90.4 %   Platelets 317 150 - 400 K/uL   Neutrophils Relative % 84 %   Neutro Abs 7.8 (H) 1.7 - 7.7 K/uL   Lymphocytes Relative 11 %   Lymphs Abs 1.0 0.7 - 4.0 K/uL   Monocytes Relative 5 %   Monocytes Absolute 0.5 0.1 - 1.0 K/uL   Eosinophils Relative 0 %   Eosinophils Absolute 0.0 0.0 - 0.7 K/uL   Basophils Relative 0 %   Basophils Absolute 0.0 0.0 - 0.1 K/uL  Comprehensive metabolic panel     Status: Abnormal   Collection Time: 03/22/16  6:02 AM  Result Value Ref Range   Sodium 138 135 - 145 mmol/L   Potassium 4.5 3.5 - 5.1 mmol/L   Chloride 107 101 - 111 mmol/L   CO2 15 (L) 22 - 32 mmol/L   Glucose, Bld 87 65 - 99 mg/dL   BUN 54 (H) 6 - 20 mg/dL   Creatinine, Ser 6.79 (H) 0.44 - 1.00 mg/dL   Calcium 8.7 (L) 8.9 - 10.3 mg/dL   Total Protein 7.1 6.5 - 8.1 g/dL   Albumin 2.7 (L) 3.5 - 5.0 g/dL   AST 8 (L) 15 - 41 U/L   ALT 7 (L) 14 - 54 U/L   Alkaline Phosphatase 31 (L) 38 - 126 U/L   Total Bilirubin 0.6 0.3 - 1.2 mg/dL   GFR calc non Af Amer 9 (L) >60 mL/min   GFR calc Af Amer 11 (L) >60 mL/min    Comment: (NOTE) The eGFR has been calculated using the CKD EPI equation. This calculation has not been validated in all clinical situations. eGFR's persistently <60 mL/min signify possible Chronic Kidney Disease.    Anion gap 16 (H) 5 - 15  Brain natriuretic peptide     Status: Abnormal   Collection Time: 03/22/16  6:03 AM  Result Value Ref Range   B Natriuretic Peptide 646.0 (H) 0.0 - 100.0 pg/mL  Urinalysis, Routine w reflex microscopic      Status: Abnormal   Collection Time: 03/22/16 12:13 PM  Result Value Ref Range   Color, Urine YELLOW YELLOW   APPearance CLOUDY (A) CLEAR   Specific Gravity, Urine 1.010 1.005 - 1.030   pH 5.0 5.0 - 8.0   Glucose, UA NEGATIVE NEGATIVE mg/dL   Hgb urine dipstick SMALL (A) NEGATIVE   Bilirubin Urine  NEGATIVE NEGATIVE   Ketones, ur NEGATIVE NEGATIVE mg/dL   Protein, ur 100 (A) NEGATIVE mg/dL   Nitrite NEGATIVE NEGATIVE   Leukocytes, UA TRACE (A) NEGATIVE   RBC / HPF 0-5 0 - 5 RBC/hpf   WBC, UA 0-5 0 - 5 WBC/hpf   Bacteria, UA RARE (A) NONE SEEN   Squamous Epithelial / LPF 0-5 (A) NONE SEEN   Mucous PRESENT    Granular Casts, UA PRESENT    Dg Chest Portable 1 View  Result Date: 03/22/2016 CLINICAL DATA:  41 y/o  F; respiratory distress. EXAM: PORTABLE CHEST 1 VIEW COMPARISON:  03/08/2016 chest radiograph FINDINGS: Moderate cardiomegaly. Diffuse patchy opacification of the lungs probably represents pulmonary edema. Underlying pneumonia is not excluded. No acute osseous abnormality is evident. No pneumothorax. No appreciable pleural effusion. IMPRESSION: Moderate cardiomegaly. Diffuse patchy opacification of the lungs probably represents pulmonary edema. Underlying pneumonia is not excluded. Electronically Signed   By: Kristine Garbe M.D.   On: 03/22/2016 05:51    Assessment/Plan: [name] is 41 y.o. female with PMH of HFpEF, HTN, T2DM, CVD and CKDIV. Cr baseline difficult to assess as has been rising and labile over recent hospital admission. Values have varied between 3.5-5. Patient presented with respiratory distress thought to be 2/2 to CHF exacerbation vs. CAP.   1.AoCKD: Likely related to poor renal perfusion secondary to volume excess. Dry weight appears to be around 165 kg so is about 3kg above dry weight. Electrolytes stable at admission. Patient with AGMA perhaps secondary to uremia. Will obtain ABG. UA with proteinuria that has been present on past urinalysis.  Also significant for granular casts. Agree with trial of diuresis. Will increase Lasix to 160 mg q6h. May end up needing HD.  2. Anemia: HgB 7.4 (appears to be patient's baseline). Patient on Fe supplements at home. Will check iron panel and PTH.  3. Acute CHF exacerbation: Diuresis as above.  4. ?PNA: Broad spectrum antibiotics per primary. Flu swab pending. Procalcitonin ordered.  5.  HTN: BPs significantly elevated at presentation. Requiring Nitro drip.  6. T2DM: Per primary.   Melina Schools 03/22/2016, 2:24 PM   Renal Attending; She presents with dyspnea and obvious volume overload in the setting of CKDV and morbid obesity. I think the definitive solution will be initiation of dialysis to reduce the chronic overload state, help LE edema and cardiac function.  Will try to diurese and if fails then need to discuss dialysis.  Her limited mobility will reduce outpt hemodialysis options. Tanai Bouler C

## 2016-03-22 NOTE — H&P (Signed)
History and Physical  Patient Name: [name]     UXL:[id number]    DOB: 08/26/1975    DOA: 03/22/2016 PCP: [name], MD   Patient coming from: Home  Chief Complaint: Respiratory distress  HPI: [name] is a 41 y.o. female with a past medical history significant for HFpEF, CKD V baseline Cr 4.5 not on HD, hx of CVA with residual def, IDDM, and HTN who presents with respiratory distress for 1 day.  The patient was in her usual state of health until the last few days when she developed "a cold", with cough, congestion which she and her husband caught this week from their children, who had colds last weekend.  Then, through the day yesterday, her husband noticed she was having more and more trouble breathing.  By late last night, she seemed altered and was struggling to breathe, so he called 9-1-1.  She has chronic swelling, including massive swelling in her right arm.   She has chronic orthopnea, no change from baseline.  She has not had purulent sputum.  She has been adherent to her medications, specifically her torsemide.  ED course: -Temp 100.88F, heart rate 104, respirations 30, BP 191/110, Spo2 94%, respiratory rate improved with BiPAP -Na 138, K 4.5, Bicarb 15, Cr 5.4 (baseline 3.5-5), WBC 9.3K, Hgb 7.4 (stable since last discharge) -Albumin 2.7 -Lactic acid 0.61 -BNP >600 -CXR shows patchy bilateral opacities, favor edema over pneumonia -She was given vancomycin and Cefepime empirically for possible pneumonia, started on nitro drip for hypertension, improved on BiPAP and TRH were asked to evaluate for acute hypoxic respiratory failure    She was recently admitted after a CBC drawn at her doctor's office showed Hgb <7.  She was given 1 unit PRBCs and discharged.  At that time, she had new right arm swelling, was given US of the RUE that was negative for clot and discharged back on her home torsemide.         ROS: Review of Systems  Constitutional:  Positive for malaise/fatigue. Negative for chills and fever.  HENT: Positive for congestion.   Respiratory: Positive for cough and shortness of breath. Negative for sputum production.   Cardiovascular: Positive for orthopnea (chronic) and leg swelling (chronic). Negative for chest pain.  Neurological: Positive for weakness (chronic).  All other systems reviewed and are negative.         Past Medical History:  Diagnosis Date  . Asthma   . CHF (congestive heart failure) (HCC)    diastolic  . CVA (cerebral infarction) 1990's   "writing is not the same since" R leg weakness   . GERD (gastroesophageal reflux disease)   . Hypertension   . Morbid obesity (HCC)   . Stroke (HCC)   . Type II diabetes mellitus (HCC)     Past Surgical History:  Procedure Laterality Date  . INCISION AND DRAINAGE OF WOUND Left 08/30/2013   thigh necrotic wound/notes 08/30/2013  . IRRIGATION AND DEBRIDEMENT ABSCESS Left 08/30/2013   Procedure: IRRIGATION AND DEBRIDEMENT ABSCESS Left Thigh Necrotic Wound;  Surgeon: [name], MD;  Location: [facility] OR;  Service: General;  Laterality: Left;  . TEE WITHOUT CARDIOVERSION N/A 09/07/2013   Procedure: TRANSESOPHAGEAL ECHOCARDIOGRAM (TEE);  Surgeon: [name], MD;  Location: [facility] ENDOSCOPY;  Service: Cardiovascular;  Laterality: N/A;  . TEE WITHOUT CARDIOVERSION N/A 02/08/2015   Procedure: TRANSESOPHAGEAL ECHOCARDIOGRAM (TEE);  Surgeon: Chilton Si, MD;  Location: [facility] ENDOSCOPY;  Service: Cardiovascular;  Laterality: N/A;  Social History: Patient lives with her children and husband.  The patient is wheelchair bound.  She requires assistance with ADLs. She is not a smoker.    Allergies  Allergen Reactions  . Azithromycin Other (See Comments)    Nose bleeding event    Family history: family history includes Diabetes in her mother; Kidney disease in her mother.  Prior to Admission medications   Medication Sig Start Date End Date Taking? Authorizing  Provider  acetaminophen (TYLENOL) 500 MG tablet Take 1,000 mg by mouth every 6 (six) hours as needed for fever (pain).     Historical Provider, MD  albuterol (PROAIR HFA) 108 (90 Base) MCG/ACT inhaler Inhale 2 puffs into the lungs every 6 (six) hours as needed for wheezing or shortness of breath. 08/12/15   Charm Rings, MD  albuterol (PROVENTIL) (2.5 MG/3ML) 0.083% nebulizer solution Take 3 mLs (2.5 mg total) by nebulization every 6 (six) hours as needed for wheezing or shortness of breath. 08/12/15   Charm Rings, MD  amLODipine (NORVASC) 10 MG tablet Take 1 tablet (10 mg total) by mouth daily. 01/28/16   Eyvonne Mechanic, PA-C  atorvastatin (LIPITOR) 20 MG tablet Take 20 mg by mouth daily.    Historical Provider, MD  carvedilol (COREG) 25 MG tablet Take 1 tablet (25 mg total) by mouth 2 (two) times daily with a meal. 01/28/16   Eyvonne Mechanic, PA-C  collagenase (SANTYL) ointment Apply 1 application topically daily.    Historical Provider, MD  ferrous sulfate 325 (65 FE) MG tablet Take 1 tablet (325 mg total) by mouth 3 (three) times daily with meals. 01/28/16   Eyvonne Mechanic, PA-C  hydrALAZINE (APRESOLINE) 50 MG tablet Take 1 tablet (50 mg total) by mouth 3 (three) times daily. 01/28/16   Eyvonne Mechanic, PA-C  insulin aspart (NOVOLOG) 100 UNIT/ML injection Inject 0-20 Units into the skin 3 (three) times daily with meals. CBG < 70: implement hypoglycemia protocol CBG 70 - 120: 0 units CBG 121 - 150: 3 units CBG 151 - 200: 4 units CBG 201 - 250: 7 units CBG 251 - 300: 11 units CBG 301 - 350: 15 units CBG 351 - 400: 20 units 01/28/16   Jeffrey Hedges, PA-C  insulin glargine (LANTUS) 100 UNIT/ML injection Inject 0.15 mLs (15 Units total) into the skin at bedtime as needed (CBG >140). 01/28/16   Eyvonne Mechanic, PA-C  isosorbide mononitrate (IMDUR) 30 MG 24 hr tablet Take 1 tablet (30 mg total) by mouth daily. 01/28/16   Eyvonne Mechanic, PA-C  potassium chloride SA (K-DUR,KLOR-CON) 20 MEQ tablet Take 2  tablets (40 mEq total) by mouth daily. 01/28/16   Eyvonne Mechanic, PA-C  torsemide (DEMADEX) 20 MG tablet Take 3 tablets (60 mg total) by mouth 2 (two) times daily. 01/28/16   Eyvonne Mechanic, PA-C  vitamin A 8000 UNIT capsule Take 8,000 Units by mouth daily.    Historical Provider, MD       Physical Exam: BP 175/98   Pulse 101   Temp 100.6 F (38.1 C) (Axillary)   Resp (!) 0   Ht 5\' 6"  (1.676 m)   Wt (!) 168.3 kg (371 lb)   LMP  (LMP Unknown)   SpO2 99%   BMI 59.88 kg/m  General appearance: Obese adult female, alert but tired.  In mild respiratory distress.   Eyes: Anicteric, conjunctiva pink, lids and lashes normal. PERRL. Amblyopia. ENT: No nasal deformity, discharge, epistaxis.  Scant clear rhinorrhea.  Hearing normal. OP moist without lesions.  Neck: No neck masses.  Trachea midline.  No thyromegaly/tenderness. Skin: Warm and dry.  No jaundice.  No suspicious rashes or lesions.  The wound on the right thigh has no surrounding redness, no drainage. Cardiac: Tachycardic, nl S1-S2, no murmurs appreciated but obscured by breath sounds.  Capillary refill is brisk.  JVP not visible.  Pitting dependent edema.  Radial pulses 2+ and symmetric. Respiratory: Tachypneic, coarse breath sounds bilaterally, no focal rales. Abdomen: Abdomen soft.  No TTP. No ascites, distension, hepatosplenomegaly can be appreciated given habitus.   MSK: No deformities or effusions.  No cyanosis or clubbing.  Right arm anasarcic. Neuro: Cranial nerves grossly normal, except amblyopia.  Sensation intact to light touch. Speech is fluent.  Muscle strength globally weak.    Psych: Sensorium intact and responding to questions, attention normal, just tired.  Behavior appropriate.  Affect blunted.  Judgment and insight appear normal.     Labs on Admission:  I have personally reviewed following labs and imaging studies: CBC:  Recent Labs Lab 03/22/16 0602  WBC 9.3  NEUTROABS 7.8*  HGB 7.4*  HCT 23.8*  MCV  88.5  PLT 317   Basic Metabolic Panel:  Recent Labs Lab 03/22/16 0602  NA 138  K 4.5  CL 107  CO2 15*  GLUCOSE 87  BUN 54*  CREATININE 5.42*  CALCIUM 8.7*   GFR: Estimated Creatinine Clearance: 22.6 mL/min (by C-G formula based on SCr of 5.42 mg/dL (H)).  Liver Function Tests:  Recent Labs Lab 03/22/16 0602  AST 8*  ALT 7*  ALKPHOS 31*  BILITOT 0.6  PROT 7.1  ALBUMIN 2.7*   No results for input(s): LIPASE, AMYLASE in the last 168 hours. No results for input(s): AMMONIA in the last 168 hours. Coagulation Profile: No results for input(s): INR, PROTIME in the last 168 hours. Cardiac Enzymes: No results for input(s): CKTOTAL, CKMB, CKMBINDEX, TROPONINI in the last 168 hours. BNP (last 3 results) No results for input(s): PROBNP in the last 8760 hours. HbA1C: No results for input(s): HGBA1C in the last 72 hours. CBG: No results for input(s): GLUCAP in the last 168 hours. Lipid Profile: No results for input(s): CHOL, HDL, LDLCALC, TRIG, CHOLHDL, LDLDIRECT in the last 72 hours. Thyroid Function Tests: No results for input(s): TSH, T4TOTAL, FREET4, T3FREE, THYROIDAB in the last 72 hours. Anemia Panel: No results for input(s): VITAMINB12, FOLATE, FERRITIN, TIBC, IRON, RETICCTPCT in the last 72 hours. Sepsis Labs: Lactic acid 0.61 Invalid input(s): PROCALCITONIN, LACTICIDVEN No results found for this or any previous visit (from the past 240 hour(s)).       Radiological Exams on Admission: Personally reviewed CXR shows patchy bilateral opacities: Dg Chest Portable 1 View  Result Date: 03/22/2016 CLINICAL DATA:  41 y/o  F; respiratory distress. EXAM: PORTABLE CHEST 1 VIEW COMPARISON:  03/08/2016 chest radiograph FINDINGS: Moderate cardiomegaly. Diffuse patchy opacification of the lungs probably represents pulmonary edema. Underlying pneumonia is not excluded. No acute osseous abnormality is evident. No pneumothorax. No appreciable pleural effusion. IMPRESSION: Moderate  cardiomegaly. Diffuse patchy opacification of the lungs probably represents pulmonary edema. Underlying pneumonia is not excluded. Electronically Signed   By: Mitzi HansenLance  Furusawa-Stratton M.D.   On: 03/22/2016 05:51    EKG: Independently reviewed. Rate 104, QTc 445, no ST changes.  Echocardiogram 2016: EF 55-60% Moderate LVH Grade I DD No significant valvular disease    Assessment/Plan  1. Acute respiratory failure with hypoxia:  Suspect this is largely fluid overload from renal disease driving #2.  Possible contribution  from viral URI or pneumonia.      2. Acute on chronic diastolic CHF:  EF 55% 2 years ago. -Furosemide 160 mg BID -Strict I/Os, daily weights -K supplement -Consult to Nephrology -Nitro gtt for now, titrate up as usual  3. CKD V, baseline Cr 3.5-5:  Presents with hypertension, acidosis, anasarca (in setting of hypoalbuminemia), pulmonary edema. -Check UA -Consult to Nephrology, appreciate cares  4. URI vs pneumonia:  Not candidate for Tamiflu given CKD no HD. -Follow flu swab -Empiric vancomycin and cefepime for now, low threshold to narrow -Follow procalcitonins -Follow urine strep antigen  5. Anemia of renal disease:  Hgb 7.5 g/dL after transfusion last month.  Stable -Daily CBC -Continue iron  6. Insulin dependent diabetes:  Takes glargine PRN at home.  HgbA1c <6% last month. -SSI with meals  7. Hypertension and stroke secondary prevention:  -Continue amlodipine, hydralazine, carvedilol, Imdur -Titrate off nitroglycerin today as able, goal BP with above and diuresis <170 systolic -Not currently on aspirin or statin     DVT prophylaxis: Heparin  Code Status: FULL  Family Communication: Husband at bedside  Disposition Plan: Anticipate IV diuresis, consult to nephrology, empiric antibiotics for now. Consults called: Nephrology, Dr. Lowell Guitar Admission status: INPATIENT         Medical decision making: Patient seen at 7:25 AM on 03/22/2016.   The patient was discussed with Dr. Bebe Shaggy and Dr. Lowell Guitar.  What exists of the patient's chart was reviewed in depth and summarized above.  Clinical condition: improving on BiPAP, still requiring BiPAP for symptomatic relief pending onset of diuresis.        Alberteen Sam [facility] Pager [phone]       At the time of admission, it appears that the appropriate admission status for this patient is INPATIENT. This is judged to be reasonable and necessary in order to provide the required intensity of service to ensure the patient's safety given the presenting symptoms, physical exam findings, and initial radiographic and laboratory data in the context of their chronic comorbidities.  Together, these circumstances are felt to place her at high risk for further clinical deterioration threatening life, limb, or organ.   Patient requires inpatient status due to high intensity of service, high risk for further deterioration and high frequency of surveillance required because of this severe exacerbation of their chronic organ failure.  I certify that at the point of admission it is my clinical judgment that the patient will require inpatient hospital care spanning beyond 2 midnights from the point of admission and that early discharge would result in unnecessary risk of decompensation and readmission or threat to life, limb or bodily function.

## 2016-03-22 NOTE — Progress Notes (Signed)
Pharmacy Antibiotic Note  [name] is a 41 y.o. female admitted on 03/22/2016 with pneumonia.  Pharmacy has been consulted for Vancomycin dosing. Pt also received Cefepime x 1 dose in ED.  SCr 4.59, est norm CrCl 18 ml/min  Plan: Vancomycin 2500mg  IV now. Will consider checking random level in ~24 hours with AKI on top of CKD Will f/u micro data, renal function, and pt's clinical condition Cefepime 1 g q24h   Height: 5\' 6"  (167.6 cm) Weight: (!) 371 lb (168.3 kg) IBW/kg (Calculated) : 59.3  Temp (24hrs), Avg:100.6 F (38.1 C), Min:100.6 F (38.1 C), Max:100.6 F (38.1 C)   Recent Labs Lab 03/22/16 0553 03/22/16 0602  WBC  --  9.3  CREATININE  --  5.42*  LATICACIDVEN 0.61  --     Estimated Creatinine Clearance: 22.6 mL/min (by C-G formula based on SCr of 5.42 mg/dL (H)).    Allergies  Allergen Reactions  . Azithromycin Other (See Comments)    Nose bleeding event    Antimicrobials this admission: 2/9 Vanc >>  2/9 Cefepime >>  Microbiology results: 2/9 BCx:   Linda BlissMichael Ailah [name], PharmD, BCPS, BCCCP Clinical Pharmacist Clinical phone for 03/22/2016 from 7a-3:30p: [phone] If after 3:30p, please call main pharmacy at: [phone] 03/22/2016 8:39 AM

## 2016-03-22 NOTE — Progress Notes (Signed)
Pharmacy Antibiotic Note  [name] is a 41 y.o. female admitted on 03/22/2016 with pneumonia.  Pharmacy has been consulted for Vancomycin dosing. Pt also received Cefepime x 1 dose in ED.  SCr 4.59, est norm CrCl 18 ml/min  Plan: Vancomycin 2500mg  IV now. Will consider checking random level in ~24 hours with AKI on top of CKD Will f/u micro data, renal function, and pt's clinical condition F/u gram negative coverage on admission   Height: 5\' 6"  (167.6 cm) Weight: (!) 371 lb (168.3 kg) IBW/kg (Calculated) : 59.3  Temp (24hrs), Avg:100.6 F (38.1 C), Min:100.6 F (38.1 C), Max:100.6 F (38.1 C)   Recent Labs Lab 03/22/16 0553  LATICACIDVEN 0.61    Estimated Creatinine Clearance: 26.7 mL/min (by C-G formula based on SCr of 4.59 mg/dL (H)).    Allergies  Allergen Reactions  . Azithromycin Other (See Comments)    Nose bleeding event    Antimicrobials this admission: 2/9 Vanc >>  2/9 Cefepime x1   Microbiology results: 2/9 BCx:    Thank you for allowing pharmacy to be a part of this patient's care.  Lavonia Danamend, Edrie Ehrich George 03/22/2016 6:04 AM

## 2016-03-23 ENCOUNTER — Encounter (HOSPITAL_COMMUNITY): Payer: Self-pay

## 2016-03-23 DIAGNOSIS — J101 Influenza due to other identified influenza virus with other respiratory manifestations: Secondary | ICD-10-CM

## 2016-03-23 DIAGNOSIS — J189 Pneumonia, unspecified organism: Secondary | ICD-10-CM | POA: Diagnosis present

## 2016-03-23 DIAGNOSIS — I161 Hypertensive emergency: Secondary | ICD-10-CM

## 2016-03-23 DIAGNOSIS — I890 Lymphedema, not elsewhere classified: Secondary | ICD-10-CM

## 2016-03-23 LAB — GLUCOSE, CAPILLARY
GLUCOSE-CAPILLARY: 64 mg/dL — AB (ref 65–99)
GLUCOSE-CAPILLARY: 92 mg/dL (ref 65–99)
Glucose-Capillary: 63 mg/dL — ABNORMAL LOW (ref 65–99)
Glucose-Capillary: 71 mg/dL (ref 65–99)
Glucose-Capillary: 74 mg/dL (ref 65–99)

## 2016-03-23 LAB — BASIC METABOLIC PANEL
Anion gap: 14 (ref 5–15)
BUN: 57 mg/dL — AB (ref 6–20)
CHLORIDE: 109 mmol/L (ref 101–111)
CO2: 15 mmol/L — ABNORMAL LOW (ref 22–32)
Calcium: 8.5 mg/dL — ABNORMAL LOW (ref 8.9–10.3)
Creatinine, Ser: 5.31 mg/dL — ABNORMAL HIGH (ref 0.44–1.00)
GFR calc Af Amer: 11 mL/min — ABNORMAL LOW (ref >60)
GFR calc non Af Amer: 9 mL/min — ABNORMAL LOW (ref >60)
GLUCOSE: 81 mg/dL (ref 65–99)
POTASSIUM: 4.6 mmol/L (ref 3.5–5.1)
SODIUM: 138 mmol/L (ref 135–145)

## 2016-03-23 LAB — PARATHYROID HORMONE, INTACT (NO CA): PTH: 99 pg/mL — ABNORMAL HIGH (ref 15–65)

## 2016-03-23 LAB — BLOOD GAS, ARTERIAL
Acid-base deficit: 9 mmol/L — ABNORMAL HIGH (ref 0.0–2.0)
BICARBONATE: 16.7 mmol/L — AB (ref 20.0–28.0)
Drawn by: 24686
FIO2: 0.21
O2 Saturation: 89.3 %
PH ART: 7.258 — AB (ref 7.350–7.450)
PO2 ART: 59.6 mmHg — AB (ref 83.0–108.0)
Patient temperature: 98.6
pCO2 arterial: 38.7 mmHg (ref 32.0–48.0)

## 2016-03-23 LAB — CBC
HEMATOCRIT: 23.1 % — AB (ref 36.0–46.0)
Hemoglobin: 7.1 g/dL — ABNORMAL LOW (ref 12.0–15.0)
MCH: 27.3 pg (ref 26.0–34.0)
MCHC: 30.7 g/dL (ref 30.0–36.0)
MCV: 88.8 fL (ref 78.0–100.0)
Platelets: 275 10*3/uL (ref 150–400)
RBC: 2.6 MIL/uL — ABNORMAL LOW (ref 3.87–5.11)
RDW: 16.2 % — ABNORMAL HIGH (ref 11.5–15.5)
WBC: 7.2 10*3/uL (ref 4.0–10.5)

## 2016-03-23 LAB — MAGNESIUM: Magnesium: 1.9 mg/dL (ref 1.7–2.4)

## 2016-03-23 LAB — PROCALCITONIN: PROCALCITONIN: 2.45 ng/mL

## 2016-03-23 LAB — MRSA PCR SCREENING: MRSA by PCR: NEGATIVE

## 2016-03-23 LAB — STREP PNEUMONIAE URINARY ANTIGEN: Strep Pneumo Urinary Antigen: NEGATIVE

## 2016-03-23 MED ORDER — ONDANSETRON HCL 4 MG PO TABS
4.0000 mg | ORAL_TABLET | Freq: Four times a day (QID) | ORAL | Status: DC | PRN
  Administered 2016-03-25: 4 mg via ORAL
  Filled 2016-03-23: qty 1

## 2016-03-23 MED ORDER — DEXTROSE 50 % IV SOLN
INTRAVENOUS | Status: AC
  Administered 2016-03-23: 25 mL
  Filled 2016-03-23: qty 50

## 2016-03-23 MED ORDER — SORBITOL 70 % SOLN
30.0000 mL | Status: DC | PRN
  Filled 2016-03-23 (×2): qty 30

## 2016-03-23 MED ORDER — OSELTAMIVIR PHOSPHATE 30 MG PO CAPS
30.0000 mg | ORAL_CAPSULE | Freq: Every day | ORAL | Status: AC
  Administered 2016-03-23 – 2016-03-26 (×4): 30 mg via ORAL
  Filled 2016-03-23 (×5): qty 1

## 2016-03-23 MED ORDER — ZOLPIDEM TARTRATE 5 MG PO TABS
5.0000 mg | ORAL_TABLET | Freq: Every evening | ORAL | Status: DC | PRN
  Filled 2016-03-23 (×2): qty 1

## 2016-03-23 MED ORDER — IPRATROPIUM-ALBUTEROL 0.5-2.5 (3) MG/3ML IN SOLN
RESPIRATORY_TRACT | Status: AC
  Filled 2016-03-23: qty 3

## 2016-03-23 MED ORDER — CALCIUM CARBONATE ANTACID 500 MG PO CHEW
2.0000 | CHEWABLE_TABLET | Freq: Four times a day (QID) | ORAL | Status: DC | PRN
  Administered 2016-03-28: 400 mg via ORAL
  Filled 2016-03-23: qty 2

## 2016-03-23 MED ORDER — IPRATROPIUM-ALBUTEROL 0.5-2.5 (3) MG/3ML IN SOLN
3.0000 mL | RESPIRATORY_TRACT | Status: DC | PRN
  Administered 2016-03-24 – 2016-03-28 (×2): 3 mL via RESPIRATORY_TRACT
  Filled 2016-03-23 (×2): qty 3

## 2016-03-23 MED ORDER — NEPRO/CARBSTEADY PO LIQD: 237.0000 mL | Freq: Three times a day (TID) | ORAL | Status: DC | PRN

## 2016-03-23 MED ORDER — IPRATROPIUM-ALBUTEROL 0.5-2.5 (3) MG/3ML IN SOLN
3.0000 mL | Freq: Three times a day (TID) | RESPIRATORY_TRACT | Status: DC
  Administered 2016-03-24 – 2016-03-30 (×20): 3 mL via RESPIRATORY_TRACT
  Filled 2016-03-23 (×4): qty 3
  Filled 2016-03-23: qty 39
  Filled 2016-03-23 (×15): qty 3

## 2016-03-23 MED ORDER — METOLAZONE 10 MG PO TABS
5.0000 mg | ORAL_TABLET | Freq: Every day | ORAL | Status: DC
  Administered 2016-03-23: 5 mg via ORAL
  Filled 2016-03-23 (×2): qty 1

## 2016-03-23 MED ORDER — BUDESONIDE 0.25 MG/2ML IN SUSP
0.2500 mg | Freq: Two times a day (BID) | RESPIRATORY_TRACT | Status: DC
  Administered 2016-03-23 – 2016-03-30 (×14): 0.25 mg via RESPIRATORY_TRACT
  Filled 2016-03-23 (×14): qty 2

## 2016-03-23 MED ORDER — ONDANSETRON HCL 4 MG/2ML IJ SOLN: 4.0000 mg | Freq: Four times a day (QID) | INTRAMUSCULAR | Status: DC | PRN

## 2016-03-23 MED ORDER — ORAL CARE MOUTH RINSE
15.0000 mL | Freq: Two times a day (BID) | OROMUCOSAL | Status: DC
  Administered 2016-03-25 – 2016-03-29 (×3): 15 mL via OROMUCOSAL

## 2016-03-23 MED ORDER — GLUCOSE 40 % PO GEL
ORAL | Status: AC
  Filled 2016-03-23: qty 1

## 2016-03-23 MED ORDER — IPRATROPIUM-ALBUTEROL 0.5-2.5 (3) MG/3ML IN SOLN
3.0000 mL | RESPIRATORY_TRACT | Status: DC
  Administered 2016-03-23 (×3): 3 mL via RESPIRATORY_TRACT
  Filled 2016-03-23 (×2): qty 3

## 2016-03-23 MED ORDER — SODIUM BICARBONATE 650 MG PO TABS
650.0000 mg | ORAL_TABLET | Freq: Three times a day (TID) | ORAL | Status: DC
  Administered 2016-03-23 – 2016-03-30 (×20): 650 mg via ORAL
  Filled 2016-03-23 (×22): qty 1

## 2016-03-23 MED ORDER — HYDROXYZINE HCL 25 MG PO TABS
25.0000 mg | ORAL_TABLET | Freq: Three times a day (TID) | ORAL | Status: DC | PRN
  Administered 2016-03-28: 25 mg via ORAL
  Filled 2016-03-23: qty 1

## 2016-03-23 MED ORDER — DOCUSATE SODIUM 283 MG RE ENEM
1.0000 | ENEMA | RECTAL | Status: DC | PRN
  Administered 2016-03-27: 283 mg via RECTAL
  Filled 2016-03-23 (×2): qty 1

## 2016-03-23 MED ORDER — ARFORMOTEROL TARTRATE 15 MCG/2ML IN NEBU
15.0000 ug | INHALATION_SOLUTION | Freq: Two times a day (BID) | RESPIRATORY_TRACT | Status: DC
  Administered 2016-03-23 – 2016-03-30 (×14): 15 ug via RESPIRATORY_TRACT
  Filled 2016-03-23 (×14): qty 2

## 2016-03-23 MED ORDER — CAMPHOR-MENTHOL 0.5-0.5 % EX LOTN
1.0000 "application " | TOPICAL_LOTION | Freq: Three times a day (TID) | CUTANEOUS | Status: DC | PRN
  Filled 2016-03-23: qty 222

## 2016-03-23 MED ORDER — CHLORHEXIDINE GLUCONATE 0.12 % MT SOLN
15.0000 mL | Freq: Two times a day (BID) | OROMUCOSAL | Status: DC
  Administered 2016-03-23 – 2016-03-30 (×12): 15 mL via OROMUCOSAL
  Filled 2016-03-23 (×9): qty 15

## 2016-03-23 NOTE — Progress Notes (Signed)
Pt passed swallow screen without incident.  Will follow up with Dr. Emmit PomfretHugelmeyer

## 2016-03-23 NOTE — Plan of Care (Signed)
Problem: Safety: Goal: Ability to remain free from injury will improve Outcome: Progressing Patient oriented x4; lethargic. Husband at bedside and able to help communicate patient's needs. Call bell within reach. Bed is low and brakes are locked. Patient and husband oriented to room and equipment. Both verbalized understanding.   Problem: Pain Managment: Goal: General experience of comfort will improve Outcome: Progressing No complaint of pain or discomfort expressed at this time. Will continue to monitor.  Problem: Activity: Goal: Risk for activity intolerance will decrease Outcome: Not Progressing Patient on bipap consistently; unable to assess activity tolerance at this time  Problem: Fluid Volume: Goal: Ability to maintain a balanced intake and output will improve Outcome: Not Progressing Pt NPO; receiving IV fluids  Problem: Nutrition: Goal: Adequate nutrition will be maintained Outcome: Not Progressing Patient currently NPO  Problem: Cardiac: Goal: Ability to achieve and maintain adequate cardiopulmonary perfusion will improve Outcome: Progressing Patient diuresing with Lasix IV. Fluid volume decreasing. Breathing starting to get better.

## 2016-03-23 NOTE — Progress Notes (Signed)
This RN spoke with Dr, Emmit PomfretHugelmeyer at the patients request concerning her diet orders.  Was advised to perform bedside swallow secrren and follow up with results to MD.  Will continue to monitor

## 2016-03-23 NOTE — Progress Notes (Signed)
Assessment/Plan: [name] is 41 y.o. female with PMH of HFpEF, HTN, T2DM, CVD and CKDIV. Cr baseline difficult to assess as has been rising and labile over recent hospital admission. Values have varied between 3.5-5. Patient presented with respiratory distress thought to be 2/2 to CHF exacerbation vs. CAP.   AoCKD: Likely related to poor renal perfusion secondary to volume excess. Dry weight appears to be around 165 kg. About 1L urine output with IV lasix.  - continue Lasix 160mg  q 6 hr for now  Anemia: HgB 7.4 (appears to be patient's baseline). Patient on Fe supplements at home. PTH mildly elevated.  Acute CHF exacerbation: Diuresis as above.  ?PNA: Broad spectrum antibiotics per primary.  Flu swab pending. Procalcitonin elevated. Influenza A: Tamiflu per primary.  HTN: BPs significantly elevated at presentation. Requiring Nitro drip.  T2DM: Per primary.    Subjective: Interval History: none. Patient is on bipap. Reports her breathing is "a little better" from yesterday.   Objective: Vital signs in last 24 hours: Temp:  [97.8 F (36.6 C)-99.2 F (37.3 C)] 97.8 F (36.6 C) (02/10 0800) Pulse Rate:  [67-96] 83 (02/10 0800) Resp:  [0-30] 18 (02/10 0800) BP: (152-182)/(72-142) 153/83 (02/10 0800) SpO2:  [97 %-100 %] 100 % (02/10 0800) FiO2 (%):  [40 %-50 %] 40 % (02/10 0500) Weight:  [180.8 kg (398 lb 9.6 oz)-183.1 kg (403 lb 11.2 oz)] 183.1 kg (403 lb 11.2 oz) (02/10 0457) Weight change: 12.5 kg (27 lb 9.6 oz)  Intake/Output from previous day: 02/09 0701 - 02/10 0700 In: 776.9 [I.V.:94.9; IV Piggyback:682] Out: 1080 [Urine:1080] Intake/Output this shift: No intake/output data recorded.  General appearance: alert and cooperative Resp: on bipap. normal work of breathing this morning. Coarse breath sounds anteriorly difficult to auscultate due to bipap Cardio: RRR GI: soft, non-tender; bowel sounds normal; no masses,  no organomegaly Extremities: edema in  bilateral extremities Neurologic: alert and answers questions appropriately   Lab Results:  Recent Labs  03/22/16 0602 03/23/16 0018  WBC 9.3 7.2  HGB 7.4* 7.1*  HCT 23.8* 23.1*  PLT 317 275   BMET:  Recent Labs  03/22/16 0602 03/23/16 0018  NA 138 138  K 4.5 4.6  CL 107 109  CO2 15* 15*  GLUCOSE 87 81  BUN 54* 57*  CREATININE 5.42* 5.31*  CALCIUM 8.7* 8.5*   No results for input(s): PTH in the last 72 hours. Iron Studies:  Recent Labs  03/22/16 1835  IRON 74  TIBC 165*  FERRITIN 457*   CBG (last 3)   Recent Labs  03/22/16 2001 03/22/16 2143 03/23/16 0920  GLUCAP 70 76 63*     Studies/Results: Dg Chest Portable 1 View  Result Date: 03/22/2016 CLINICAL DATA:  41 y/o  F; respiratory distress. EXAM: PORTABLE CHEST 1 VIEW COMPARISON:  03/08/2016 chest radiograph FINDINGS: Moderate cardiomegaly. Diffuse patchy opacification of the lungs probably represents pulmonary edema. Underlying pneumonia is not excluded. No acute osseous abnormality is evident. No pneumothorax. No appreciable pleural effusion. IMPRESSION: Moderate cardiomegaly. Diffuse patchy opacification of the lungs probably represents pulmonary edema. Underlying pneumonia is not excluded. Electronically Signed   By: Mitzi Hansen M.D.   On: 03/22/2016 05:51     LOS: 1 day   Palma Holter 03/23/2016,10:25 AM   Renal Attending: I spoke with patient and husband. She has been told she is not a candidate for dialysis.  I would say she is not a candidate in [place] for outpatient dialysis.  She is 41  young, in my opinion to deny dialysis due to her morbid obesity.  She will eventually(near future) need dialysis. Outpt dialysis in this community requires ability to transfer.  She is not eligible for OP HD in [place], but there are facilities outside our community and state which provide that care.  She cannot get home dialysis without a backup OP HD unit which she cannot get.  She is  bed bound and needs to go to either an LTAK or SNF with dialysis. Husband unhappy with discussion.  I told her should would die from kidney failure in the future without treatment and that treatment would likely not be in [place] as long as she is bed bound. Case management need to help us with disposition. Kirsten Spearing C

## 2016-03-23 NOTE — Progress Notes (Signed)
RN took patient off of BiPAP due to patient feeling of nausea, patient did not vomit.

## 2016-03-23 NOTE — Progress Notes (Signed)
Prior care note from Dr. Maryfrances Bunnellanford states pt is not a candidate for Tamiflu. This RN spoke with Daphane ShepherdM. Lynch NP who prescribed Tamiflu and pharmacist Veronda.  Both individuals confirmed that this treatment is appropriate for the patient but that it is dose adjusted to account for her CKD.  Tamiflu administered. Will continue to monitor and pass information along to day shift RN.

## 2016-03-23 NOTE — Progress Notes (Signed)
PROGRESS NOTE    Maxene Byington  ZOX:[id number] DOB: [birth year] DOA: 03/22/2016 PCP: [name], MD    Brief Narrative:   Lyrika Souders is a 41 y.o. female with a past medical history significant for HFpEF, CKD V baseline Cr 4.5 not on HD, hx of CVA with residual def, IDDM, and HTN who presented with respiratory distress for 1 day.  The patient was in her usual state of health until the last few days when she developed "a cold", with cough, congestion which she and her husband caught this week from their children, who had colds last weekend.  Then, through the day yesterday, her husband noticed she was having more and more trouble breathing.  By late last night, she seemed altered and was struggling to breathe, so he called 9-1-1.  She has chronic swelling, including massive swelling in her right arm.   She has chronic orthopnea, no change from baseline.  She has not had purulent sputum.  She has been adherent to her medications, specifically her torsemide.    Assessment & Plan:   Principal Problem:   Acute respiratory failure with hypoxia (HCC) Active Problems:   Acute on chronic diastolic CHF (congestive heart failure) (HCC)   Influenza A   HCAP (healthcare-associated pneumonia)   Morbid obesity (HCC)   Chronic kidney disease (CKD), stage IV (severe) (HCC)   Essential hypertension   Acute respiratory failure (HCC)   History of CVA with residual deficit   Anemia of chronic disease   Lymphedema   Uncontrolled type 2 diabetes mellitus with complication, with long-term current use of insulin (HCC)   Symptomatic anemia   Arm swelling   Flu-like symptoms   Hypertensive emergency   Metabolic acidosis   Acute kidney injury superimposed on CKD (HCC)  #1 acute respiratory failure with hypoxia Likely multifactorial secondary to acute on chronic diastolic heart failure, influenza A, probable healthcare associated pneumonia and morbid obesity. Patient currently on  the BiPAP. Patient does state some improvement with shortness of breath. Patient with a urine output of 1.080 L over the past 24 hours and is -303.1 over the past 24 hours. Cardiac enzymes not cycled on admission. 2-D echo with a EF of 55-60%, moderate LVH, grade 2 diastolic dysfunction with elevated left ventricular end-diastolic pressure, small mobile echodensity attached the left coronary cusp more prominent than previously described nodule of arantius, mitral valve leaflets thickened. Influenza PCR positive for influenza A. continue high-dose Lasix 160 mg every 6 hours, strict I's and O's, daily weights, Tamiflu, empiric IV cefepime. If patient with no significant diuresis with IV Lasix may require hemodialysis. Trial off BiPAP. Nephrology following and appreciate input and recommendations.  #2 acute on chronic diastolic heart failure/anasarca Questionable etiology. 2-D echo with a EF of 55-60%, moderate LVH, grade 2 diastolic dysfunction, small mobile echodensity attached to the left coronary cusp more prominent than previously described nodule of arantius. Patient with a urine output of 1.080 L over the past 24 hours and is -3.0 3.1 mL over the past 24 hours. Patient on Lasix 160 mg IV every 6 hours. Blood cultures pending. Blood cultures are positive will require a TEE to rule out endocarditis as 2-D echo with a more prominent mobile echodensity noted on left coronary cusps. Continue Coreg, hydralazine,imdur. If patient does not have any significant diuresis may require hemodialysis. Nephrology following.  #3 influenza A Tamiflu. Supportive care.  #4 probable HCAP Chest x-ray on admission. Patient had presented with upper respiratory symptoms which  could all likely be secondary to influenza A. Blood cultures pending. Check a sputum Gram stain and culture. Urine strep pneumococcus antigen negative. Urine Legionella antigen pending. Continue IV cefepime. Patient received a dose of IV vancomycin 1  however vancomycin held secondary to acute on chronic kidney disease. Place on Mucinex. Nebulizer treatments as needed.  #5 acute on chronic kidney disease IV ( baseline creatinine 3.5-5) Likely secondary to poor flow state from acute on chronic diastolic heart failure. Creatinine currently at 5.31 from 5.42 on admission from 4.59 on 03/09/2016. Patient on high-dose IV Lasix at 160 mg every 6 hours with a urine output of 1.080 L over the past 24 hours. Patient is -303.1 mL over the past 24 hours. If no significant improvement with volume overload on IV Lasix patient may require hemodialysis. Appreciate nephrology input and recommendations.  #6 anemia of renal disease Patient transfuse a unit of packed red blood cells during last hospitalization with a hemoglobin of 7.5 03/09/2016. Hemoglobin currently at 7.1. No overt bleeding. Follow H&H. Transfusion threshold hemoglobin less than 7.  #7 morbid obesity  #8 insulin-dependent diabetes mellitus Hemoglobin A1c was 5.2 on 03/09/2016.SSI  #9 Metabolic acidosis Likely secondary to AoCKD and uremia. Place on bicarbonate tablets. Per renal.  #10 hypertensive emergency Patient on Nitro drip with improvement in blood pressure. Continue Norvasc, Coreg, hydralazine and uptitrate if needed. Wean off nitroglycerin drip.  #11 right upper extremity edema Noted since last hospitalization where patient had Dopplers done which were negative for DVT. Likely secondary to volume overload as well as possible lymphedema. Monitor.    DVT prophylaxis: Heparin Code Status: Full Family Communication: (Specify name, relationship & date discussed. NO "discussed with patient") Disposition Plan: Remain the step down unit today. Patient requiring BiPAP. Likely home once acute on chronic diastolic heart failure is compensated and acute on chronic kidney disease has improved and patient back to baseline and per nephrology.   Consultants:   Nephrology: Dr. Lowell Guitar  03/22/2016  Procedures:   Chest x-ray 03/22/2016  2-D echo 03/22/2016  Antimicrobials:   IV cefepime 03/22/2016  IV vancomycin 1 dose.  Tamiflu 03/23/2016   Subjective: Patient resting comfortably on BiPAP. Patient states some improvement with shortness of breath since admission. Patient with cough. Patient asking how much fluid intake that she need to have per day.  Objective: Vitals:   03/23/16 0000 03/23/16 0457 03/23/16 0500 03/23/16 0800  BP: (!) 154/128  (!) 153/84 (!) 153/83  Pulse: 86  88 83  Resp: 20  16 18   Temp: 98.7 F (37.1 C)  99.2 F (37.3 C) 97.8 F (36.6 C)  TempSrc: Axillary  Axillary Axillary  SpO2: 100%  100% 100%  Weight:  (!) 183.1 kg (403 lb 11.2 oz)    Height:        Intake/Output Summary (Last 24 hours) at 03/23/16 1022 Last data filed at 03/23/16 0400  Gross per 24 hour  Intake            236.9 ml  Output             1080 ml  Net           -843.1 ml   Filed Weights   03/22/16 0533 03/22/16 2117 03/23/16 0457  Weight: (!) 168.3 kg (371 lb) (!) 180.8 kg (398 lb 9.6 oz) (!) 183.1 kg (403 lb 11.2 oz)    Examination:  General exam: On BiPAP. Anasarca. Respiratory system: Diffuse crackles. Some coarse/rhonchorus breath sounds. Minimal expiratory wheezing.  Cardiovascular system: Distant heart sounds secondary to body habitus. S1 & S2 heard, RRR. No JVD, murmurs, rubs, gallops or clicks. 2-3+ bilateral lower extremity up to lower abdomen. Gastrointestinal system: Abdomen is nondistended, soft and nontender. No organomegaly or masses felt. Normal bowel sounds heard. Central nervous system: Alert and oriented. No focal neurological deficits. Extremities: 2-3+ bilateral lower extremity edema up to lower abdomen. Right upper extremity edema ongoing since last hospitalization. Skin: No rashes, lesions or ulcers Psychiatry: Judgement and insight appear normal. Mood & affect appropriate.     Data Reviewed: I have personally reviewed following  labs and imaging studies  CBC:  Recent Labs Lab 03/22/16 0602 03/23/16 0018  WBC 9.3 7.2  NEUTROABS 7.8*  --   HGB 7.4* 7.1*  HCT 23.8* 23.1*  MCV 88.5 88.8  PLT 317 275   Basic Metabolic Panel:  Recent Labs Lab 03/22/16 0602 03/23/16 0018  NA 138 138  K 4.5 4.6  CL 107 109  CO2 15* 15*  GLUCOSE 87 81  BUN 54* 57*  CREATININE 5.42* 5.31*  CALCIUM 8.7* 8.5*  MG  --  1.9   GFR: Estimated Creatinine Clearance: 24.4 mL/min (by C-G formula based on SCr of 5.31 mg/dL (H)). Liver Function Tests:  Recent Labs Lab 03/22/16 0602  AST 8*  ALT 7*  ALKPHOS 31*  BILITOT 0.6  PROT 7.1  ALBUMIN 2.7*   No results for input(s): LIPASE, AMYLASE in the last 168 hours. No results for input(s): AMMONIA in the last 168 hours. Coagulation Profile: No results for input(s): INR, PROTIME in the last 168 hours. Cardiac Enzymes: No results for input(s): CKTOTAL, CKMB, CKMBINDEX, TROPONINI in the last 168 hours. BNP (last 3 results) No results for input(s): PROBNP in the last 8760 hours. HbA1C: No results for input(s): HGBA1C in the last 72 hours. CBG:  Recent Labs Lab 03/22/16 2001 03/22/16 2143 03/23/16 0920  GLUCAP 70 76 63*   Lipid Profile: No results for input(s): CHOL, HDL, LDLCALC, TRIG, CHOLHDL, LDLDIRECT in the last 72 hours. Thyroid Function Tests: No results for input(s): TSH, T4TOTAL, FREET4, T3FREE, THYROIDAB in the last 72 hours. Anemia Panel:  Recent Labs  03/22/16 1835  FERRITIN 457*  TIBC 165*  IRON 74   Sepsis Labs:  Recent Labs Lab 03/22/16 0553 03/23/16 0018  PROCALCITON  --  2.45  LATICACIDVEN 0.61  --     Recent Results (from the past 240 hour(s))  Blood culture (routine x 2)     Status: None (Preliminary result)   Collection Time: 03/22/16  5:30 AM  Result Value Ref Range Status   Specimen Description BLOOD LEFT HAND  Final   Special Requests BOTTLES DRAWN AEROBIC ONLY  Final   Culture PENDING  Incomplete   Report Status  PENDING  Incomplete  MRSA PCR Screening     Status: None   Collection Time: 03/22/16  9:51 PM  Result Value Ref Range Status   MRSA by PCR NEGATIVE NEGATIVE Final    Comment:        The GeneXpert MRSA Assay (FDA approved for NASAL specimens only), is one component of a comprehensive MRSA colonization surveillance program. It is not intended to diagnose MRSA infection nor to guide or monitor treatment for MRSA infections.          Radiology Studies: Dg Chest Portable 1 View  Result Date: 03/22/2016 CLINICAL DATA:  41 y/o  F; respiratory distress. EXAM: PORTABLE CHEST 1 VIEW COMPARISON:  03/08/2016 chest radiograph FINDINGS: Moderate  cardiomegaly. Diffuse patchy opacification of the lungs probably represents pulmonary edema. Underlying pneumonia is not excluded. No acute osseous abnormality is evident. No pneumothorax. No appreciable pleural effusion. IMPRESSION: Moderate cardiomegaly. Diffuse patchy opacification of the lungs probably represents pulmonary edema. Underlying pneumonia is not excluded. Electronically Signed   By: Mitzi HansenLance  Furusawa-Stratton M.D.   On: 03/22/2016 05:51        Scheduled Meds: . amLODipine  10 mg Oral Daily  . carvedilol  25 mg Oral BID WC  . ceFEPime (MAXIPIME) IV  1 g Intravenous Q24H  . chlorhexidine  15 mL Mouth Rinse BID  . dextrose      . dextrose      . ferrous sulfate  325 mg Oral TID WC  . furosemide  160 mg Intravenous Q6H  . heparin  5,000 Units Subcutaneous Q8H  . hydrALAZINE  50 mg Oral TID  . insulin aspart  0-15 Units Subcutaneous TID WC  . isosorbide mononitrate  30 mg Oral Daily  . mouth rinse  15 mL Mouth Rinse q12n4p  . oseltamivir  30 mg Oral Daily  . potassium chloride  40 mEq Oral Daily  . sodium chloride flush  3 mL Intravenous Q12H   Continuous Infusions: . nitroGLYCERIN 10 mcg/min (03/22/16 1329)     LOS: 1 day    Time spent: 40 minutes    Thelda Gagan, MD [facility] Pager [phone]  If  7PM-7AM, please contact night-coverage [email or web address] Password [email or web address] 03/23/2016, 10:22 AM

## 2016-03-24 DIAGNOSIS — E872 Acidosis: Secondary | ICD-10-CM

## 2016-03-24 DIAGNOSIS — D649 Anemia, unspecified: Secondary | ICD-10-CM

## 2016-03-24 LAB — CBC
HEMATOCRIT: 20.4 % — AB (ref 36.0–46.0)
HEMATOCRIT: 25.3 % — AB (ref 36.0–46.0)
Hemoglobin: 6.3 g/dL — CL (ref 12.0–15.0)
Hemoglobin: 8 g/dL — ABNORMAL LOW (ref 12.0–15.0)
MCH: 27 pg (ref 26.0–34.0)
MCH: 27 pg (ref 26.0–34.0)
MCHC: 30.9 g/dL (ref 30.0–36.0)
MCHC: 31.6 g/dL (ref 30.0–36.0)
MCV: 87.6 fL (ref 78.0–100.0)
MCV: 85.5 fL (ref 78.0–100.0)
PLATELETS: 267 10*3/uL (ref 150–400)
PLATELETS: 237 10*3/uL (ref 150–400)
RBC: 2.33 MIL/uL — ABNORMAL LOW (ref 3.87–5.11)
RBC: 2.96 MIL/uL — AB (ref 3.87–5.11)
RDW: 16.3 % — ABNORMAL HIGH (ref 11.5–15.5)
RDW: 16.5 % — ABNORMAL HIGH (ref 11.5–15.5)
WBC: 5.8 10*3/uL (ref 4.0–10.5)
WBC: 5.6 10*3/uL (ref 4.0–10.5)

## 2016-03-24 LAB — VANCOMYCIN, RANDOM: Vancomycin Rm: 20

## 2016-03-24 LAB — BRAIN NATRIURETIC PEPTIDE: B Natriuretic Peptide: 344.9 pg/mL — ABNORMAL HIGH (ref 0.0–100.0)

## 2016-03-24 LAB — BASIC METABOLIC PANEL
Anion gap: 15 (ref 5–15)
BUN: 59 mg/dL — AB (ref 6–20)
CHLORIDE: 106 mmol/L (ref 101–111)
CO2: 16 mmol/L — ABNORMAL LOW (ref 22–32)
CREATININE: 5.2 mg/dL — AB (ref 0.44–1.00)
Calcium: 8.1 mg/dL — ABNORMAL LOW (ref 8.9–10.3)
GFR calc Af Amer: 11 mL/min — ABNORMAL LOW (ref >60)
GFR calc non Af Amer: 10 mL/min — ABNORMAL LOW (ref >60)
GLUCOSE: 68 mg/dL (ref 65–99)
POTASSIUM: 4.2 mmol/L (ref 3.5–5.1)
Sodium: 137 mmol/L (ref 135–145)

## 2016-03-24 LAB — PREPARE RBC (CROSSMATCH)

## 2016-03-24 LAB — GLUCOSE, CAPILLARY
Glucose-Capillary: 55 mg/dL — ABNORMAL LOW (ref 65–99)
Glucose-Capillary: 73 mg/dL (ref 65–99)
Glucose-Capillary: 86 mg/dL (ref 65–99)
Glucose-Capillary: 87 mg/dL (ref 65–99)

## 2016-03-24 LAB — LIPID PANEL
CHOLESTEROL: 185 mg/dL (ref 0–200)
HDL: 27 mg/dL — AB (ref >40)
LDL CALC: 119 mg/dL — AB (ref 0–99)
TRIGLYCERIDES: 194 mg/dL — AB (ref <150)
Total CHOL/HDL Ratio: 6.9 RATIO
VLDL: 39 mg/dL (ref 0–40)

## 2016-03-24 LAB — PROCALCITONIN: PROCALCITONIN: 3.01 ng/mL

## 2016-03-24 MED ORDER — METOLAZONE 10 MG PO TABS
10.0000 mg | ORAL_TABLET | Freq: Every day | ORAL | Status: DC
  Administered 2016-03-24 – 2016-03-30 (×6): 10 mg via ORAL
  Filled 2016-03-24 (×6): qty 1

## 2016-03-24 MED ORDER — ACETAMINOPHEN 325 MG PO TABS
650.0000 mg | ORAL_TABLET | Freq: Once | ORAL | Status: AC
  Administered 2016-03-24: 650 mg via ORAL
  Filled 2016-03-24: qty 2

## 2016-03-24 MED ORDER — DEXTROSE 50 % IV SOLN
INTRAVENOUS | Status: AC
  Administered 2016-03-24: 25 mL
  Filled 2016-03-24: qty 50

## 2016-03-24 MED ORDER — DIPHENHYDRAMINE HCL 25 MG PO CAPS
25.0000 mg | ORAL_CAPSULE | Freq: Once | ORAL | Status: AC
  Administered 2016-03-24: 25 mg via ORAL
  Filled 2016-03-24: qty 1

## 2016-03-24 MED ORDER — FUROSEMIDE 10 MG/ML IJ SOLN
20.0000 mg | Freq: Once | INTRAMUSCULAR | Status: AC
  Administered 2016-03-24: 20 mg via INTRAVENOUS
  Filled 2016-03-24: qty 2

## 2016-03-24 MED ORDER — SODIUM CHLORIDE 0.9 % IV SOLN: Freq: Once | INTRAVENOUS | Status: DC

## 2016-03-24 NOTE — Progress Notes (Addendum)
Pharmacy Antibiotic Note  [name] is a 41 y.o. female admitted on 03/22/2016 with pneumonia. Pharmacy has been consulted for vancomycin and cefepime dosing. Of note, patient is influenza A positive on day #2 Tamiflu. She is currently afebrile with wbc trending down (5.8) and impaired renal function (AKI on CKD, nCrCl ~15-20 ml/min).   Last dose of vancomycin was at 0630 on 2/9. Random vancomycin level today is 20 mcg/ml, indicating that she does not need another dose of vancomycin today. Given slight improvement in scr and urine output, will enter vancomycin random level for the morning to assess if dose is needed tomorrow.   Plan: Cefepime 1 gram IV every 24 hours Vancomycin dosed when needed according to renal function and levels Vancomycin random level with AM labs Monitor clinical improvement, renal function, and culture data  Addendum: Discontinue vancomycin and levels   Height: 5\' 6"  (167.6 cm) Weight: (!) 404 lb 6.4 oz (183.4 kg) IBW/kg (Calculated) : 59.3  Temp (24hrs), Avg:98.5 F (36.9 C), Min:97.9 F (36.6 C), Max:99.1 F (37.3 C)   Recent Labs Lab 03/22/16 0553 03/22/16 0602 03/23/16 0018 03/24/16 0332  WBC  --  9.3 7.2 5.8  CREATININE  --  5.42* 5.31* 5.20*  LATICACIDVEN 0.61  --   --   --   VANCORANDOM  --   --   --  20    Estimated Creatinine Clearance: 25 mL/min (by C-G formula based on SCr of 5.2 mg/dL (H)).    Allergies  Allergen Reactions  . Azithromycin Other (See Comments)    Nose bleeding event   Antimicrobials this admission: 2/9 Vanc >>  2/9 Cefepime >>  Dose adjustments: 2/11 Vanc random: 20, no vanc given  Microbiology results: 2/9 BCx: ngtd 2/9 MRSA pcr: neg 2/9 strep pnemo antigen: neg  Thank you for allowing pharmacy to be a part of this patient's care.  Allie BossierApryl Anderson, PharmD PGY1 Pharmacy Resident MS3 [phone] 03/24/2016 9:59 AM

## 2016-03-24 NOTE — Progress Notes (Signed)
PROGRESS NOTE    [name]  GNF:[id number] DOB: 06/13/1975 DOA: 03/22/2016 PCP: Marletta LorBarr, Julie, NP    Brief Narrative:   [name] is a 41 y.o. female with a past medical history significant for HFpEF, CKD V baseline Cr 4.5 not on HD, hx of CVA with residual def, IDDM, and HTN who presented with respiratory distress for 1 day.  The patient was in her usual state of health until the last few days when she developed "a cold", with cough, congestion which she and her husband caught this week from their children, who had colds last weekend.  Then, through the day yesterday, her husband noticed she was having more and more trouble breathing.  By late last night, she seemed altered and was struggling to breathe, so he called 9-1-1.  She has chronic swelling, including massive swelling in her right arm.   She has chronic orthopnea, no change from baseline.  She has not had purulent sputum.  She has been adherent to her medications, specifically her torsemide. Patient was admitted placed on BiPAP in the step down unit and on high-dose Lasix 160 mg every 6 hours as well as Zaroxolyn. Patient also noted to be anemic with hemoglobin dropping as low as 6.3 and patient transfused 2 units packed red blood cells. Nephrology following.    Assessment & Plan:   Principal Problem:   Acute respiratory failure with hypoxia (HCC) Active Problems:   Acute on chronic diastolic CHF (congestive heart failure) (HCC)   Influenza A   HCAP (healthcare-associated pneumonia)   Morbid obesity (HCC)   Chronic kidney disease (CKD), stage IV (severe) (HCC)   Essential hypertension   Acute respiratory failure (HCC)   History of CVA with residual deficit   Anemia of chronic disease   Lymphedema   Uncontrolled type 2 diabetes mellitus with complication, with long-term current use of insulin (HCC)   Symptomatic anemia   Arm swelling   Flu-like symptoms   Hypertensive emergency   Metabolic  acidosis   Acute kidney injury superimposed on CKD (HCC)  #1 acute respiratory failure with hypoxia Likely multifactorial secondary to acute on chronic diastolic heart failure, influenza A, probable healthcare associated pneumonia and morbid obesity and anemia. Patient off the BiPAP and on 6 L nasal cannula. Patient does state some improvement with shortness of breath. Patient with a urine output of 2.425 L over the past 24 hours and is - 1.610 L over the past 24 hours. Cardiac enzymes not cycled on admission. 2-D echo with a EF of 55-60%, moderate LVH, grade 2 diastolic dysfunction with elevated left ventricular end-diastolic pressure, small mobile echodensity attached the left coronary cusp more prominent than previously described nodule of arantius, mitral valve leaflets thickened. Influenza PCR positive for influenza A. continue high-dose Lasix 160 mg every 6 hours and Zaroxolyn, strict I's and O's, daily weights, Tamiflu, empiric IV cefepime. Continue Pulmicort, Brovana, scheduled nebulizers. If patient with no significant diuresis with IV Lasix and Zaroxolyn may require hemodialysis. BiPAP as needed. Nephrology following and appreciate input and recommendations.  #2 acute on chronic diastolic heart failure/anasarca Questionable etiology. 2-D echo with a EF of 55-60%, moderate LVH, grade 2 diastolic dysfunction, small mobile echodensity attached to the left coronary cusp more prominent than previously described nodule of arantius. Patient with a urine output of 2.425 L over the past 24 hours and is - 1.610L over the past 24 hours. Patient on Lasix 160 mg IV every 6 hours and Zaroxolyn 5 mg  daily. Increase Zaroxolyn to 10 mg daily. Blood cultures pending. If Blood cultures are positive will require a TEE to rule out endocarditis as 2-D echo with a more prominent mobile echodensity noted on left coronary cusps. Continue Coreg, hydralazine,imdur. If patient does not have any significant diuresis may require  hemodialysis. Nephrology following.  #3 influenza A Tamiflu. Nebulizer treatments. Supportive care.  #4 probable HCAP Chest x-ray on admission. Patient had presented with upper respiratory symptoms which could all likely be secondary to influenza A. Blood cultures pending. Sputum Gram stain and culture. Urine strep pneumococcus antigen negative. Urine Legionella antigen pending. MRSA screen negative. Continue IV cefepime. Patient received a dose of IV vancomycin 1 however vancomycin held secondary to acute on chronic kidney disease. DC IV vancomycin. Continue Mucinex. Nebulizer treatments, Pulmicort, Brovana.   #5 acute on chronic kidney disease IV ( baseline creatinine 3.5-5) Likely secondary to poor flow state from acute on chronic diastolic heart failure. Creatinine currently at 5.20 from 5.42 on admission from 4.59 on 03/09/2016. Patient on high-dose IV Lasix at 160 mg every 6 hours and Zaroxolyn 5 mg daily with a urine output of 2.425 L over the past 24 hours. Patient is - 1.610 L over the past 24 hours.Continue Lasix 160 mg IV every 6 hours and increase Zaroxolyn to10 mg daily. Strict I's and O's. If no significant improvement with volume overload on IV Lasix patient may require hemodialysis. Appreciate nephrology input and recommendations.  #6 anemia of renal disease/Iron deficiency anemia/symptomatic anemia Patient transfused a unit of packed red blood cells during last hospitalization with a hemoglobin of 7.5 03/09/2016. Hemoglobin currently at 6.3. Patient denies any overt bleeding. Patient stated that recently had her menstrual cycle a couple of days prior to admission. On prior anemia panel today she noted to be iron deficient. Patient states she is on oral iron tablets at home. Check FOBT. Chest use 2 units packed red blood cells. Follow H&H. Transfusion threshold hemoglobin less than 7.  #7 morbid obesity  #8 well-controlled insulin-dependent diabetes mellitus Hemoglobin A1c was 5.2  on 03/09/2016. CBG 55-92. SSI  #9 Metabolic acidosis Likely secondary to AoCKD and uremia. Continue bicarbonate tablets. Per renal.  #10 hypertensive emergency Patient on Nitro drip with improvement in blood pressure. Continue Norvasc, Coreg, hydralazine and uptitrate if needed. D/C nitroglycerin drip.  #11 right upper extremity edema Noted since last hospitalization where patient had Dopplers done which were negative for DVT. Likely secondary to volume overload as well as possible lymphedema. Monitor.    DVT prophylaxis: Heparin Code Status: Full Family Communication: (Specify name, relationship & date discussed. NO "discussed with patient") Disposition Plan: Remain the step down unit today. Patient requiring BiPAP prn. Likely home vs SNF, once acute on chronic diastolic heart failure is compensated and acute on chronic kidney disease has improved and patient back to baseline and per nephrology.   Consultants:   Nephrology: Dr. Lowell Guitar 03/22/2016  Procedures:   Chest x-ray 03/22/2016  2-D echo 03/22/2016  2 units packed red blood cells pending 03/24/2016  Antimicrobials:   IV cefepime 03/22/2016  IV vancomycin 1 dose.>>> 03/24/2016  Tamiflu 03/23/2016   Subjective: Patient currently off BiPAP and on 6 L nasal cannula. Patient states some improvement of shortness of breath. Patient with cough. Patient tolerating current diet.  Objective: Vitals:   03/24/16 0000 03/24/16 0400 03/24/16 0500 03/24/16 0735  BP: (!) 146/82 131/80  131/84  Pulse: 83 83  82  Resp: 10 (!) 21  18  Temp: 98.8  F (37.1 C) 98.6 F (37 C)  98.2 F (36.8 C)  TempSrc: Oral Oral  Oral  SpO2: 98% 99%  96%  Weight:   (!) 183.4 kg (404 lb 6.4 oz)   Height:        Intake/Output Summary (Last 24 hours) at 03/24/16 0957 Last data filed at 03/24/16 0600  Gross per 24 hour  Intake              758 ml  Output             2425 ml  Net            -1667 ml   Filed Weights   03/22/16 2117  03/23/16 0457 03/24/16 0500  Weight: (!) 180.8 kg (398 lb 9.6 oz) (!) 183.1 kg (403 lb 11.2 oz) (!) 183.4 kg (404 lb 6.4 oz)    Examination:  General exam: On 6L [place]. Anasarca. Respiratory system: Diffuse crackles. Some coarse/rhonchorus breath sounds. Mild expiratory wheezing.  Cardiovascular system: Distant heart sounds secondary to body habitus. S1 & S2 heard, RRR. No JVD, murmurs, rubs, gallops or clicks. 2-3+ bilateral lower extremity up to lower abdomen. Gastrointestinal system: Abdomen is nondistended, soft and nontender. No organomegaly or masses felt. Normal bowel sounds heard. Central nervous system: Alert and oriented. No focal neurological deficits. Extremities: 2-3+ bilateral lower extremity edema up to lower abdomen. Right upper extremity edema ongoing since last hospitalization. Skin: No rashes, lesions or ulcers Psychiatry: Judgement and insight appear normal. Mood & affect appropriate.     Data Reviewed: I have personally reviewed following labs and imaging studies  CBC:  Recent Labs Lab 03/22/16 0602 03/23/16 0018 03/24/16 0332  WBC 9.3 7.2 5.8  NEUTROABS 7.8*  --   --   HGB 7.4* 7.1* 6.3*  HCT 23.8* 23.1* 20.4*  MCV 88.5 88.8 87.6  PLT 317 275 267   Basic Metabolic Panel:  Recent Labs Lab 03/22/16 0602 03/23/16 0018 03/24/16 0332  NA 138 138 137  K 4.5 4.6 4.2  CL 107 109 106  CO2 15* 15* 16*  GLUCOSE 87 81 68  BUN 54* 57* 59*  CREATININE 5.42* 5.31* 5.20*  CALCIUM 8.7* 8.5* 8.1*  MG  --  1.9  --    GFR: Estimated Creatinine Clearance: 25 mL/min (by C-G formula based on SCr of 5.2 mg/dL (H)). Liver Function Tests:  Recent Labs Lab 03/22/16 0602  AST 8*  ALT 7*  ALKPHOS 31*  BILITOT 0.6  PROT 7.1  ALBUMIN 2.7*   No results for input(s): LIPASE, AMYLASE in the last 168 hours. No results for input(s): AMMONIA in the last 168 hours. Coagulation Profile: No results for input(s): INR, PROTIME in the last 168 hours. Cardiac Enzymes: No  results for input(s): CKTOTAL, CKMB, CKMBINDEX, TROPONINI in the last 168 hours. BNP (last 3 results) No results for input(s): PROBNP in the last 8760 hours. HbA1C: No results for input(s): HGBA1C in the last 72 hours. CBG:  Recent Labs Lab 03/23/16 1232 03/23/16 1721 03/23/16 2117 03/23/16 2317 03/24/16 0824  GLUCAP 71 74 64* 92 55*   Lipid Profile:  Recent Labs  03/24/16 0332  CHOL 185  HDL 27*  LDLCALC 119*  TRIG 194*  CHOLHDL 6.9   Thyroid Function Tests: No results for input(s): TSH, T4TOTAL, FREET4, T3FREE, THYROIDAB in the last 72 hours. Anemia Panel:  Recent Labs  03/22/16 1835  FERRITIN 457*  TIBC 165*  IRON 74   Sepsis Labs:  Recent Labs Lab 03/22/16  1610 03/23/16 0018 03/24/16 0332  PROCALCITON  --  2.45 3.01  LATICACIDVEN 0.61  --   --     Recent Results (from the past 240 hour(s))  Blood culture (routine x 2)     Status: None (Preliminary result)   Collection Time: 03/22/16  5:30 AM  Result Value Ref Range Status   Specimen Description BLOOD LEFT HAND  Final   Special Requests BOTTLES DRAWN AEROBIC ONLY  Final   Culture NO GROWTH 1 DAY  Final   Report Status PENDING  Incomplete  Blood culture (routine x 2)     Status: None (Preliminary result)   Collection Time: 03/22/16  6:18 AM  Result Value Ref Range Status   Specimen Description BLOOD LEFT HAND  Final   Special Requests IN PEDIATRIC BOTTLE  Final   Culture NO GROWTH 1 DAY  Final   Report Status PENDING  Incomplete  MRSA PCR Screening     Status: None   Collection Time: 03/22/16  9:51 PM  Result Value Ref Range Status   MRSA by PCR NEGATIVE NEGATIVE Final    Comment:        The GeneXpert MRSA Assay (FDA approved for NASAL specimens only), is one component of a comprehensive MRSA colonization surveillance program. It is not intended to diagnose MRSA infection nor to guide or monitor treatment for MRSA infections.          Radiology Studies: No results  found.      Scheduled Meds: . sodium chloride   Intravenous Once  . acetaminophen  650 mg Oral Once  . amLODipine  10 mg Oral Daily  . arformoterol  15 mcg Nebulization BID  . budesonide (PULMICORT) nebulizer solution  0.25 mg Nebulization BID  . carvedilol  25 mg Oral BID WC  . ceFEPime (MAXIPIME) IV  1 g Intravenous Q24H  . chlorhexidine  15 mL Mouth Rinse BID  . diphenhydrAMINE  25 mg Oral Once  . ferrous sulfate  325 mg Oral TID WC  . furosemide  160 mg Intravenous Q6H  . furosemide  20 mg Intravenous Once  . heparin  5,000 Units Subcutaneous Q8H  . hydrALAZINE  50 mg Oral TID  . insulin aspart  0-15 Units Subcutaneous TID WC  . ipratropium-albuterol  3 mL Nebulization TID  . isosorbide mononitrate  30 mg Oral Daily  . mouth rinse  15 mL Mouth Rinse q12n4p  . metolazone  10 mg Oral Daily  . oseltamivir  30 mg Oral Daily  . potassium chloride  40 mEq Oral Daily  . sodium bicarbonate  650 mg Oral TID  . sodium chloride flush  3 mL Intravenous Q12H   Continuous Infusions:    LOS: 2 days    Time spent: 45 minutes    THOMPSON,DANIEL, MD [facility] Pager [phone]  If 7PM-7AM, please contact night-coverage [email or web address] Password [facility] 03/24/2016, 9:57 AM

## 2016-03-24 NOTE — Progress Notes (Signed)
CRITICAL VALUE ALERT  Critical value received:  Hemoglobin 6.3  Date of notification:  03/24/2016  Time of notification:  0535  Critical value read back:Yes.    Nurse who received alert:  Kristeen Misshris Daniel Johndrow RN  MD notified (1st page):  A. Hugelmeyer  Time of first page:  0535  MD notified (2nd page):  Time of second page:  Responding MD:  A. Hugelmeyer  Time MD responded:

## 2016-03-24 NOTE — Progress Notes (Signed)
BG low. Pt symptomatic; c/o nausea and shakiness. Hypoglycemia protocol followed. Will continue to monitor and intervene PRN.

## 2016-03-24 NOTE — Progress Notes (Signed)
Assessment/Plan: [name] is 41 y.o. female with PMH of HFpEF, HTN, T2DM, CVD and CKDIV. Cr baseline difficult to assess as has been rising and labile over recent hospital admission. Values have varied between 3.5-5. Patient presented with respiratory distress thought to be 2/2 to CHF exacerbation vs. CAP.   AoCKD: Likely related to poor renal perfusion secondary to volume excess. Dry weight appears to be around 165 kg. About 2.5L urine output with IV lasix.  - continue Lasix 160mg  q 6 hr for now  -will need CM assistance with HD placement as patient is bed bound; PT consult also placed  Anemia: HgB 6.3. [facility]uPRBC today per primary. Patient on Fe supplements at home. PTH mildly elevated.  Acute CHF exacerbation: Diuresis as above.  ?PNA: Cefepime per primary. Procalcitonin elevated. Influenza A: Tamiflu per primary.  HTN: BPs significantly elevated at presentation. Required Nitro drip on day of admission. Now on Coreg, Amlodipine, and Hydralazine.  T2DM: Per primary.    Subjective: Interval History: Patient on [place]. Reports breathing is better. Reports she has not ambulated for >1 year. Is wheelchair at home and requires assistance with transfers.   Objective: Vital signs in last 24 hours: Temp:  [97.9 F (36.6 C)-99.1 F (37.3 C)] 98.2 F (36.8 C) (02/11 0735) Pulse Rate:  [81-83] 82 (02/11 0735) Resp:  [10-21] 18 (02/11 0735) BP: (131-164)/(73-88) 131/84 (02/11 0735) SpO2:  [96 %-100 %] 96 % (02/11 0735) FiO2 (%):  [36 %-40 %] 36 % (02/10 1547) Weight:  [404 lb 6.4 oz (183.4 kg)] 404 lb 6.4 oz (183.4 kg) (02/11 0500) Weight change: 5 lb 12.8 oz (2.631 kg)  Intake/Output from previous day: 02/10 0701 - 02/11 0700 In: 1118 [P.O.:720; I.V.:84; IV Piggyback:314] Out: 2425 [Urine:2425] Intake/Output this shift: No intake/output data recorded.  General appearance: alert and cooperative Resp: on [place]. normal work of breathing this morning. Coarse breath sounds anteriorly  difficult to auscultate due to body habitus  Cardio: RRR GI: soft, non-tender; bowel sounds normal; no masses,  no organomegaly Extremities: 2+pitting edema in bilateral extremities Neurologic: alert and answers questions appropriately   Lab Results:  Recent Labs  03/23/16 0018 03/24/16 0332  WBC 7.2 5.8  HGB 7.1* 6.3*  HCT 23.1* 20.4*  PLT 275 267   BMET:   Recent Labs  03/23/16 0018 03/24/16 0332  NA 138 137  K 4.6 4.2  CL 109 106  CO2 15* 16*  GLUCOSE 81 68  BUN 57* 59*  CREATININE 5.31* 5.20*  CALCIUM 8.5* 8.1*    Recent Labs  03/22/16 2132  PTH 99*   Iron Studies:   Recent Labs  03/22/16 1835  IRON 74  TIBC 165*  FERRITIN 457*   CBG (last 3)   Recent Labs  03/23/16 1721 03/23/16 2117 03/23/16 2317  GLUCAP 74 64* 92     Studies/Results: No results found.   LOS: 2 days   De HollingsheadCatherine L Wallace 03/24/2016,8:02 AM   Renal Attending. I agree with note above. See my note from 2/10 I discussed those matters again. [name]

## 2016-03-24 NOTE — Progress Notes (Signed)
BG low. Pt symptomatic; c/o nausea. Hypoglycemia protocol followed. Will continue to monitor and intervene PRN.

## 2016-03-25 LAB — TYPE AND SCREEN
ABO/RH(D): A POS
ANTIBODY SCREEN: NEGATIVE
UNIT DIVISION: 0
Unit division: 0

## 2016-03-25 LAB — CBC
HCT: 24.7 % — ABNORMAL LOW (ref 36.0–46.0)
Hemoglobin: 7.9 g/dL — ABNORMAL LOW (ref 12.0–15.0)
MCH: 27 pg (ref 26.0–34.0)
MCHC: 32 g/dL (ref 30.0–36.0)
MCV: 84.3 fL (ref 78.0–100.0)
PLATELETS: 253 10*3/uL (ref 150–400)
RBC: 2.93 MIL/uL — AB (ref 3.87–5.11)
RDW: 16.6 % — ABNORMAL HIGH (ref 11.5–15.5)
WBC: 5.1 10*3/uL (ref 4.0–10.5)

## 2016-03-25 LAB — RENAL FUNCTION PANEL
ALBUMIN: 2.1 g/dL — AB (ref 3.5–5.0)
Anion gap: 11 (ref 5–15)
BUN: 59 mg/dL — AB (ref 6–20)
CALCIUM: 7.6 mg/dL — AB (ref 8.9–10.3)
CO2: 17 mmol/L — ABNORMAL LOW (ref 22–32)
Chloride: 110 mmol/L (ref 101–111)
Creatinine, Ser: 5.06 mg/dL — ABNORMAL HIGH (ref 0.44–1.00)
GFR calc Af Amer: 11 mL/min — ABNORMAL LOW (ref >60)
GFR calc non Af Amer: 10 mL/min — ABNORMAL LOW (ref >60)
GLUCOSE: 76 mg/dL (ref 65–99)
PHOSPHORUS: 5.8 mg/dL — AB (ref 2.5–4.6)
POTASSIUM: 3.9 mmol/L (ref 3.5–5.1)
SODIUM: 138 mmol/L (ref 135–145)

## 2016-03-25 LAB — GLUCOSE, CAPILLARY
GLUCOSE-CAPILLARY: 57 mg/dL — AB (ref 65–99)
Glucose-Capillary: 64 mg/dL — ABNORMAL LOW (ref 65–99)
Glucose-Capillary: 79 mg/dL (ref 65–99)
Glucose-Capillary: 72 mg/dL (ref 65–99)
Glucose-Capillary: 78 mg/dL (ref 65–99)

## 2016-03-25 MED ORDER — DEXTROSE-NACL 5-0.9 % IV SOLN
INTRAVENOUS | Status: DC
  Administered 2016-03-25: 11:00:00 via INTRAVENOUS

## 2016-03-25 MED ORDER — DEXTROSE 50 % IV SOLN
INTRAVENOUS | Status: AC
  Administered 2016-03-25: 50 mL
  Filled 2016-03-25: qty 50

## 2016-03-25 NOTE — Progress Notes (Signed)
Assessment/Plan: Linda DykesDavenia McKinnon [name] is 41 y.o. female with PMH of supermorbid obesity, HFpEF, HTN, T2DM, CVD and CKDIV. Here with Flu, SOB, Vol O/l  AoCKD: I suspect this is mostly CKD, little Acute component.  Dry weight appears to be around 165 kg. - continue Lasix 160mg  q 6 hr for now  -will need CM assistance with HD placement as patient is bed bound; PT consult also placed  Anemia: Was transfused during admission;  Will likely need ESA if possible as outpt.   Acute CHF exacerbation: Diuresis as above.  ?PNA: Cefepime per primary. Procalcitonin elevated. Influenza A: Tamiflu per primary.  HTN: BPs significantly elevated at presentation. Required Nitro drip on day of admission. Now on Coreg, Amlodipine, and Hydralazine.  T2DM: Per primary.    Subjective: Stable GFR overnight Pt on RA when I see her, intermittently using BiPAP On high dose diuretics  Objective: Vital signs in last 24 hours: Temp:  [98.5 F (36.9 C)-99.7 F (37.6 C)] 98.7 F (37.1 C) (02/12 1100) Pulse Rate:  [78-86] 82 (02/12 1014) Resp:  [19-29] 21 (02/12 1014) BP: (122-147)/(59-85) 147/85 (02/12 0924) SpO2:  [88 %-100 %] 98 % (02/12 1246) Weight:  [182.2 kg (401 lb 9.6 oz)] 182.2 kg (401 lb 9.6 oz) (02/12 0739) Weight change:   Intake/Output from previous day: 02/11 0701 - 02/12 0700 In: 1805 [P.O.:480; Blood:1325] Out: 1025 [Urine:1025] Intake/Output this shift: Total I/O In: 179 [P.O.:60; I.V.:3; IV Piggyback:116] Out: 1000 [Urine:1000]  General appearance: alert and cooperative Resp: on [place]. normal work of breathing this morning. Coarse breath sounds anteriorly difficult to auscultate due to body habitus  Cardio: RRR GI: soft, non-tender; bowel sounds normal; no masses,  no organomegaly Extremities: 2+pitting edema in bilateral extremities Neurologic: alert and answers questions appropriately   Lab Results:  Recent Labs  03/24/16 2046 03/25/16 0218  WBC 5.6 5.1  HGB 8.0* 7.9*  HCT  25.3* 24.7*  PLT 237 253   BMET:   Recent Labs  03/24/16 0332 03/25/16 0934  NA 137 138  K 4.2 3.9  CL 106 110  CO2 16* 17*  GLUCOSE 68 76  BUN 59* 59*  CREATININE 5.20* 5.06*  CALCIUM 8.1* 7.6*    Recent Labs  03/22/16 2132  PTH 99*   Iron Studies:   Recent Labs  03/22/16 1835  IRON 74  TIBC 165*  FERRITIN 457*   CBG (last 3)   Recent Labs  03/25/16 0743 03/25/16 0925 03/25/16 1149  GLUCAP 64* 79 72     Studies/Results: No results found.   LOS: 3 days

## 2016-03-25 NOTE — Evaluation (Signed)
Physical Therapy Evaluation Patient Details Name: [name] MRN: [id number] DOB: Feb 25, 1975 Today's Date: 03/25/2016   History of Present Illness  [name] is 41 y.o. female with PMH of HFpEF, HTN, T2DM, CVD and CKDIV. Cr baseline difficult to assess as has been rising and labile over recent hospital admission. Values have varied between 3.5-5. Patient presented with respiratory distress thought to be 2/2 to CHF exacerbation vs. CAP +flu. Pt with swelling, especially RUE.  Clinical Impression  Pt admitted with above diagnosis. Pt currently with functional limitations due to the deficits listed below (see PT Problem List). Pt has been transferring to w/c and [facility] periodically at home, she appears to be limited mostly by fear of falling. Feel that she could get to a place where she could safely transfer with more rehab so recommending SNF to help her increase mobility to be able to tolerate OP HD when the need arises. Pt will benefit from skilled PT to increase their independence and safety with mobility to allow discharge to the venue listed below.       Follow Up Recommendations SNF    Equipment Recommendations  None recommended by PT    Recommendations for Other Services       Precautions / Restrictions Precautions Precautions: Fall Restrictions Weight Bearing Restrictions: No      Mobility  Bed Mobility Overal bed mobility: Needs Assistance Bed Mobility: Supine to Sit;Sit to Supine     Supine to sit: Mod assist Sit to supine: +2 for physical assistance;Max assist   General bed mobility comments: pt has great difficulty moving within bed due to body habitus and swelling, hand over hand assist of RUE to grasp rail on left, mod A to BLE's to intiate movement to EOB. Mod A and use of rail for trunk elevation to sitting and mod A for scooting hips to EOB. Pt able to help minimally with return to supine. Able to go down onto left elbow but +2 max A needed  for legs into bed and +3 tot A needed to scoot to [facility]  Transfers                    Ambulation/Gait                Stairs            Wheelchair Mobility    Modified Rankin (Stroke Patients Only)       Balance Overall balance assessment: Needs assistance Sitting-balance support: No upper extremity supported Sitting balance-Leahy Scale: Fair Sitting balance - Comments: pt able to sit EOB and eat lunch but limited in ability to reach across tray, decreased use of R side                                     Pertinent Vitals/Pain Pain Assessment: No/denies pain        O2 sats 95% on RA, dropped to 87% in supine, 2L O2 reapplied.   Home Living Family/patient expects to be discharged to:: Private residence Living Arrangements: Spouse/significant other Available Help at Discharge: Family;Available 24 hours/day Type of Home: Apartment Home Access: Level entry     Home Layout: One level Home Equipment: Bedside commode;Walker - 2 wheels;Wheelchair - Engineer, technical sales - power;Cane - quad;Shower seat Additional Comments: pt has 3, 4, and 7 yo children    Prior Function Level of Independence: Needs assistance   Gait /  Transfers Assistance Needed: has not walked at home since CVA. Walked some in rehab but has been afraid of falling since home and so [facility] but otherwise stays in bed  ADL's / Homemaking Assistance Needed: husband and sister assist with bathing and toileting as well as caring for children        Hand Dominance   Dominant Hand: Right    Extremity/Trunk Assessment   Upper Extremity Assessment Upper Extremity Assessment: Defer to OT evaluation;RUE deficits/detail RUE Deficits / Details: increased swelling RUE, decreased grip strength. Now uses LUE for self feeding RUE Coordination: decreased fine motor;decreased gross motor    Lower Extremity Assessment Lower Extremity Assessment: RLE deficits/detail;LLE  deficits/detail;Generalized weakness RLE Deficits / Details: grossly 2/5, swelling RLE>LLE LLE Deficits / Details: grossly 2+/5    Cervical / Trunk Assessment Cervical / Trunk Assessment: Normal  Communication   Communication: No difficulties  Cognition Arousal/Alertness: Awake/alert Behavior During Therapy: Flat affect Overall Cognitive Status: Within Functional Limits for tasks assessed                      General Comments General comments (skin integrity, edema, etc.): spoke with RW about getting bigger recliner for pivot OOB. Pt agreeable to try with therapy.     Exercises     Assessment/Plan    PT Assessment Patient needs continued PT services  PT Problem List Decreased strength;Decreased activity tolerance;Decreased range of motion;Decreased mobility;Decreased balance;Decreased knowledge of use of DME;Decreased knowledge of precautions;Impaired tone;Cardiopulmonary status limiting activity          PT Treatment Interventions DME instruction;Functional mobility training;Therapeutic activities;Therapeutic exercise;Balance training;Patient/family education    PT Goals (Current goals can be found in the Care Plan section)  Acute Rehab PT Goals Patient Stated Goal: be able to walk PT Goal Formulation: With patient Time For Goal Achievement: 04/08/16 Potential to Achieve Goals: Fair    Frequency Min 3X/week   Barriers to discharge        Co-evaluation               End of Session   Activity Tolerance: Patient tolerated treatment well Patient left: in bed;with call bell/phone within reach Nurse Communication: Mobility status         Time: 1305-1330 PT Time Calculation (min) (ACUTE ONLY): 25 min   Charges:   PT Evaluation $PT Eval Moderate Complexity: 1 Procedure PT Treatments $Therapeutic Activity: 8-22 mins   PT G Codes:      Lyanne CoVictoria Joh Rao, PT  Acute Rehab Services  [phone]   [place] ParkVictoria L Brewster Wolters 03/25/2016, 2:08 PM

## 2016-03-25 NOTE — Plan of Care (Signed)
Problem: Activity: Goal: Capacity to carry out activities will improve Outcome: Progressing Patient sat on side of bed today.

## 2016-03-25 NOTE — Progress Notes (Signed)
PROGRESS NOTE    [name]  ZOX:[id number] DOB: 09/27/1975 DOA: 03/22/2016 PCP: Marletta LorBarr, Julie, NP    Brief Narrative:   [name] is a 41 y.o. female with a past medical history significant for HFpEF, CKD V baseline Cr 4.5 not on HD, hx of CVA with residual def, IDDM, and HTN who presented with respiratory distress for 1 day.  The patient was in her usual state of health until the last few days when she developed "a cold", with cough, congestion which she and her husband caught this week from their children, who had colds last weekend.  Then, through the day yesterday, her husband noticed she was having more and more trouble breathing.  By late last night, she seemed altered and was struggling to breathe, so he called 9-1-1.  She has chronic swelling, including massive swelling in her right arm.   She has chronic orthopnea, no change from baseline.  She has not had purulent sputum.  She has been adherent to her medications, specifically her torsemide. Patient was admitted placed on BiPAP in the step down unit and on high-dose Lasix 160 mg every 6 hours as well as Zaroxolyn. Patient also noted to be anemic with hemoglobin dropping as low as 6.3 and patient transfused 2 units packed red blood cells. Nephrology following.    Assessment & Plan:   Principal Problem:   Acute respiratory failure with hypoxia (HCC) Active Problems:   Acute on chronic diastolic CHF (congestive heart failure) (HCC)   Influenza A   HCAP (healthcare-associated pneumonia)   Morbid obesity (HCC)   Chronic kidney disease (CKD), stage IV (severe) (HCC)   Essential hypertension   Acute respiratory failure (HCC)   History of CVA with residual deficit   Anemia of chronic disease   Lymphedema   Uncontrolled type 2 diabetes mellitus with complication, with long-term current use of insulin (HCC)   Symptomatic anemia   Arm swelling   Flu-like symptoms   Hypertensive emergency   Metabolic  acidosis   Acute kidney injury superimposed on CKD (HCC)  #1 acute respiratory failure with hypoxia Likely multifactorial secondary to acute on chronic diastolic heart failure, influenza A, probable healthcare associated pneumonia and morbid obesity and anemia. Patient back on the BiPAP this morning secondary to increased respiratory distress and worsening shortness of breath per respiratory therapy.  Patient does state some improvement with shortness of breath. Patient with a urine output of 1.025 L over the past 24 hours and is - 0.830 L over the past 24 hours. Cardiac enzymes not cycled on admission. 2-D echo with a EF of 55-60%, moderate LVH, grade 2 diastolic dysfunction with elevated left ventricular end-diastolic pressure, small mobile echodensity attached the left coronary cusp more prominent than previously described nodule of arantius, mitral valve leaflets thickened. Influenza PCR positive for influenza A. continue high-dose Lasix 160 mg every 6 hours and Zaroxolyn, strict I's and O's, daily weights, Tamiflu, empiric IV cefepime. Continue Pulmicort, Brovana, scheduled nebulizers. If patient with no significant diuresis with IV Lasix and Zaroxolyn may require hemodialysis. BiPAP as needed. Nephrology following and appreciate input and recommendations.  #2 acute on chronic diastolic heart failure/anasarca Questionable etiology. 2-D echo with a EF of 55-60%, moderate LVH, grade 2 diastolic dysfunction, small mobile echodensity attached to the left coronary cusp more prominent than previously described nodule of arantius. Patient with a urine output of 1.025 L over the past 24 hours and is - 0.767 L over the past 24 hours. Patient  on Lasix 160 mg IV every 6 hours and Zaroxolyn 10 mg daily. Blood cultures pending. If Blood cultures are positive will require a TEE to rule out endocarditis as 2-D echo with a more prominent mobile echodensity noted on left coronary cusps. Continue Coreg,  hydralazine,imdur. If patient does not have any significant diuresis may require hemodialysis. Nephrology following.  #3 influenza A Tamiflu. Nebulizer treatments. Supportive care.  #4 probable HCAP Chest x-ray on admission. Patient had presented with upper respiratory symptoms which could all likely be secondary to influenza A. Blood cultures pending. Sputum Gram stain and culture. Urine strep pneumococcus antigen negative. Urine Legionella antigen pending. MRSA screen negative. Continue IV cefepime. Patient received a dose of IV vancomycin 1 however vancomycin held secondary to acute on chronic kidney disease. DC IV vancomycin. Continue Mucinex. Nebulizer treatments, Pulmicort, Brovana.   #5 acute on chronic kidney disease IV ( baseline creatinine 3.5-5) Likely secondary to poor flow state from acute on chronic diastolic heart failure. Creatinine currently at 5.06 phone 5.20 from 5.42 on admission from 4.59 on 03/09/2016. Patient on high-dose IV Lasix at 160 mg every 6 hours and Zaroxolyn 10 mg daily with a urine output of 1.025 L over the past 24 hours. Patient is - 0.830 L over the past 24 hours.Continue Lasix 160 mg IV every 6 hours and Zaroxolyn to10 mg daily. Strict I's and O's. If no significant improvement with volume overload on IV Lasix patient may require hemodialysis. Appreciate nephrology input and recommendations.  #6 anemia of renal disease/Iron deficiency anemia/symptomatic anemia Patient transfused a unit of packed red blood cells during last hospitalization with a hemoglobin of 7.5 03/09/2016. Hemoglobin currently at 7.9 from 6.3 (03/24/2016) after 2 units packed red blood cells transfusion on 03/24/2016.Marland Kitchen Patient denies any overt bleeding. Patient stated that recently had her menstrual cycle a couple of days prior to admission. On prior anemia panel today she noted to be iron deficient. Patient states she is on oral iron tablets at home. Check FOBT. Follow H&H. Transfusion threshold  hemoglobin less than 7.  #7 morbid obesity  #8 well-controlled insulin-dependent diabetes mellitus Hemoglobin A1c was 5.2 on 03/09/2016. CBG 57-79. Patient with poor oral intake. Will place on D5 normal saline at [facility] SSI  #9 Metabolic acidosis Likely secondary to AoCKD and uremia. Improving on bicarbonate tablets. Per renal.  #10 hypertensive emergency Patient was on Nitro drip with improvement in blood pressure. Nitro drip has been discontinued. Continue Norvasc, Coreg, hydralazine and uptitrate if needed.   #11 right upper extremity edema Noted since last hospitalization where patient had Dopplers done which were negative for DVT. Likely secondary to volume overload as well as possible lymphedema. Monitor.  #12 debility Patient noted to be bedbound and states he uses a wheelchair. Once respiratory status improves will have patient evaluated by PT/OT.    DVT prophylaxis: Heparin Code Status: Full Family Communication: (Specify name, relationship & date discussed. NO "discussed with patient") Disposition Plan: Remain the step down unit today. Patient requiring BiPAP prn. Likely home vs SNF, once acute on chronic diastolic heart failure is compensated and acute on chronic kidney disease has improved and patient back to baseline and has been assessed by PT/OT and per nephrology.   Consultants:   Nephrology: Dr. Lowell Guitar 03/22/2016  Procedures:   Chest x-ray 03/22/2016  2-D echo 03/22/2016  2 units packed red blood cells 03/24/2016  Antimicrobials:   IV cefepime 03/22/2016  IV vancomycin 1 dose.>>> 03/24/2016  Tamiflu 03/23/2016   Subjective: Patient currently  on BiPAP per RT as patient was noted to be significantly short of breath with some use of accessory muscles of respiration. Patient with some complaints of shortness of breath this morning however speaking in full sentences on the BiPAP. No chest pain. Patient also noted to have low blood glucose levels with a CBG  as low as 57.   Objective: Vitals:   03/25/16 0746 03/25/16 0924 03/25/16 0947 03/25/16 1014  BP: 139/82 (!) 147/85    Pulse: 86   82  Resp: (!) 29   (!) 21  Temp: 98.9 F (37.2 C)     TempSrc: Oral     SpO2: 99%  98% 100%  Weight:      Height:        Intake/Output Summary (Last 24 hours) at 03/25/16 1044 Last data filed at 03/25/16 0928  Gross per 24 hour  Intake          1507.99 ml  Output             1025 ml  Net           482.99 ml   Filed Weights   03/23/16 0457 03/24/16 0500 03/25/16 0739  Weight: (!) 183.1 kg (403 lb 11.2 oz) (!) 183.4 kg (404 lb 6.4 oz) (!) 182.2 kg (401 lb 9.6 oz)    Examination:  General exam: On BIPAP. Anasarca. Respiratory system: Diffuse crackles. Some coarse/rhonchorus breath sounds.  Cardiovascular system: Distant heart sounds secondary to body habitus. S1 & S2 heard, RRR. No JVD, murmurs, rubs, gallops or clicks. 2-3+ bilateral lower extremity up to lower abdomen. Gastrointestinal system: Abdomen is nondistended, soft and nontender. No organomegaly or masses felt. Normal bowel sounds heard. Central nervous system: Alert and oriented. No focal neurological deficits. Extremities: 2-3+ bilateral lower extremity edema up to lower abdomen. Right upper extremity edema ongoing since last hospitalization. Skin: No rashes, lesions or ulcers Psychiatry: Judgement and insight appear normal. Mood & affect appropriate.     Data Reviewed: I have personally reviewed following labs and imaging studies  CBC:  Recent Labs Lab 03/22/16 0602 03/23/16 0018 03/24/16 0332 03/24/16 2046 03/25/16 0218  WBC 9.3 7.2 5.8 5.6 5.1  NEUTROABS 7.8*  --   --   --   --   HGB 7.4* 7.1* 6.3* 8.0* 7.9*  HCT 23.8* 23.1* 20.4* 25.3* 24.7*  MCV 88.5 88.8 87.6 85.5 84.3  PLT 317 275 267 237 253   Basic Metabolic Panel:  Recent Labs Lab 03/22/16 0602 03/23/16 0018 03/24/16 0332  NA 138 138 137  K 4.5 4.6 4.2  CL 107 109 106  CO2 15* 15* 16*  GLUCOSE 87 81  68  BUN 54* 57* 59*  CREATININE 5.42* 5.31* 5.20*  CALCIUM 8.7* 8.5* 8.1*  MG  --  1.9  --    GFR: Estimated Creatinine Clearance: 24.9 mL/min (by C-G formula based on SCr of 5.2 mg/dL (H)). Liver Function Tests:  Recent Labs Lab 03/22/16 0602  AST 8*  ALT 7*  ALKPHOS 31*  BILITOT 0.6  PROT 7.1  ALBUMIN 2.7*   No results for input(s): LIPASE, AMYLASE in the last 168 hours. No results for input(s): AMMONIA in the last 168 hours. Coagulation Profile: No results for input(s): INR, PROTIME in the last 168 hours. Cardiac Enzymes: No results for input(s): CKTOTAL, CKMB, CKMBINDEX, TROPONINI in the last 168 hours. BNP (last 3 results) No results for input(s): PROBNP in the last 8760 hours. HbA1C: No results for input(s): HGBA1C in  the last 72 hours. CBG:  Recent Labs Lab 03/24/16 1727 03/24/16 2015 03/25/16 0740 03/25/16 0743 03/25/16 0925  GLUCAP 86 87 57* 64* 79   Lipid Profile:  Recent Labs  03/24/16 0332  CHOL 185  HDL 27*  LDLCALC 119*  TRIG 194*  CHOLHDL 6.9   Thyroid Function Tests: No results for input(s): TSH, T4TOTAL, FREET4, T3FREE, THYROIDAB in the last 72 hours. Anemia Panel:  Recent Labs  03/22/16 1835  FERRITIN 457*  TIBC 165*  IRON 74   Sepsis Labs:  Recent Labs Lab 03/22/16 0553 03/23/16 0018 03/24/16 0332  PROCALCITON  --  2.45 3.01  LATICACIDVEN 0.61  --   --     Recent Results (from the past 240 hour(s))  Blood culture (routine x 2)     Status: None (Preliminary result)   Collection Time: 03/22/16  5:30 AM  Result Value Ref Range Status   Specimen Description BLOOD LEFT HAND  Final   Special Requests BOTTLES DRAWN AEROBIC ONLY  Final   Culture NO GROWTH 2 DAYS  Final   Report Status PENDING  Incomplete  Blood culture (routine x 2)     Status: None (Preliminary result)   Collection Time: 03/22/16  6:18 AM  Result Value Ref Range Status   Specimen Description BLOOD LEFT HAND  Final   Special Requests IN PEDIATRIC  BOTTLE  Final   Culture NO GROWTH 2 DAYS  Final   Report Status PENDING  Incomplete  MRSA PCR Screening     Status: None   Collection Time: 03/22/16  9:51 PM  Result Value Ref Range Status   MRSA by PCR NEGATIVE NEGATIVE Final    Comment:        The GeneXpert MRSA Assay (FDA approved for NASAL specimens only), is one component of a comprehensive MRSA colonization surveillance program. It is not intended to diagnose MRSA infection nor to guide or monitor treatment for MRSA infections.          Radiology Studies: No results found.      Scheduled Meds: . sodium chloride   Intravenous Once  . amLODipine  10 mg Oral Daily  . arformoterol  15 mcg Nebulization BID  . budesonide (PULMICORT) nebulizer solution  0.25 mg Nebulization BID  . carvedilol  25 mg Oral BID WC  . ceFEPime (MAXIPIME) IV  1 g Intravenous Q24H  . chlorhexidine  15 mL Mouth Rinse BID  . ferrous sulfate  325 mg Oral TID WC  . furosemide  160 mg Intravenous Q6H  . heparin  5,000 Units Subcutaneous Q8H  . hydrALAZINE  50 mg Oral TID  . insulin aspart  0-15 Units Subcutaneous TID WC  . ipratropium-albuterol  3 mL Nebulization TID  . isosorbide mononitrate  30 mg Oral Daily  . mouth rinse  15 mL Mouth Rinse q12n4p  . metolazone  10 mg Oral Daily  . oseltamivir  30 mg Oral Daily  . potassium chloride  40 mEq Oral Daily  . sodium bicarbonate  650 mg Oral TID  . sodium chloride flush  3 mL Intravenous Q12H   Continuous Infusions:    LOS: 3 days    Time spent: 40 minutes    THOMPSON,DANIEL, MD [facility] Pager [phone]  If 7PM-7AM, please contact night-coverage [email or web address] Password [facility] 03/25/2016, 10:44 AM

## 2016-03-26 DIAGNOSIS — J810 Acute pulmonary edema: Secondary | ICD-10-CM

## 2016-03-26 LAB — RENAL FUNCTION PANEL
ALBUMIN: 2.1 g/dL — AB (ref 3.5–5.0)
Anion gap: 15 (ref 5–15)
BUN: 61 mg/dL — AB (ref 6–20)
CALCIUM: 8 mg/dL — AB (ref 8.9–10.3)
CO2: 17 mmol/L — AB (ref 22–32)
Chloride: 106 mmol/L (ref 101–111)
Creatinine, Ser: 5.17 mg/dL — ABNORMAL HIGH (ref 0.44–1.00)
GFR calc Af Amer: 11 mL/min — ABNORMAL LOW (ref >60)
GFR calc non Af Amer: 10 mL/min — ABNORMAL LOW (ref >60)
GLUCOSE: 64 mg/dL — AB (ref 65–99)
PHOSPHORUS: 5.7 mg/dL — AB (ref 2.5–4.6)
Potassium: 4.1 mmol/L (ref 3.5–5.1)
SODIUM: 138 mmol/L (ref 135–145)

## 2016-03-26 LAB — GLUCOSE, CAPILLARY
GLUCOSE-CAPILLARY: 66 mg/dL (ref 65–99)
GLUCOSE-CAPILLARY: 86 mg/dL (ref 65–99)
Glucose-Capillary: 61 mg/dL — ABNORMAL LOW (ref 65–99)
Glucose-Capillary: 77 mg/dL (ref 65–99)
Glucose-Capillary: 74 mg/dL (ref 65–99)
Glucose-Capillary: 73 mg/dL (ref 65–99)

## 2016-03-26 LAB — CBC
HCT: 26.7 % — ABNORMAL LOW (ref 36.0–46.0)
Hemoglobin: 8.4 g/dL — ABNORMAL LOW (ref 12.0–15.0)
MCH: 27.2 pg (ref 26.0–34.0)
MCHC: 31.5 g/dL (ref 30.0–36.0)
MCV: 86.4 fL (ref 78.0–100.0)
Platelets: 265 10*3/uL (ref 150–400)
RBC: 3.09 MIL/uL — ABNORMAL LOW (ref 3.87–5.11)
RDW: 17.1 % — AB (ref 11.5–15.5)
WBC: 5.1 10*3/uL (ref 4.0–10.5)

## 2016-03-26 LAB — PROCALCITONIN: PROCALCITONIN: 1.78 ng/mL

## 2016-03-26 MED ORDER — DEXTROSE 10 % IV SOLN
INTRAVENOUS | Status: DC
  Administered 2016-03-26: 17:00:00 via INTRAVENOUS
  Filled 2016-03-26: qty 1000

## 2016-03-26 MED ORDER — AMOXICILLIN-POT CLAVULANATE 875-125 MG PO TABS
1.0000 | ORAL_TABLET | Freq: Two times a day (BID) | ORAL | Status: DC
  Administered 2016-03-27: 1 via ORAL
  Filled 2016-03-26 (×4): qty 1

## 2016-03-26 NOTE — Progress Notes (Signed)
Inpatient Diabetes Program Recommendations  AACE/ADA: New Consensus Statement on Inpatient Glycemic Control (2015)  Target Ranges:  Prepandial:   less than 140 mg/dL      Peak postprandial:   less than 180 mg/dL (1-2 hours)      Critically ill patients:  140 - 180 mg/dL   Lab Results  Component Value Date   GLUCAP 77 03/26/2016   HGBA1C 5.2 03/09/2016    Review of Glycemic Control:  Results for [name], [name] (MRN [id number]) as of 03/26/2016 14:19  Ref. Range 03/25/2016 09:25 03/25/2016 11:49 03/25/2016 15:56 03/26/2016 08:30 03/26/2016 11:59  Glucose-Capillary Latest Ref Range: 65 - 99 mg/dL 79 72 78 61 (L) 77   Inpatient Diabetes Program Recommendations:    Consider holding/discontinuation of Novolog correction until blood sugars greater than 180 mg/dL.  Thanks, Beryl MeagerJenny Ivey Nembhard, RN, BC-ADM Inpatient Diabetes Coordinator Pager [phone] (8a-5p)

## 2016-03-26 NOTE — Progress Notes (Signed)
Assessment/Plan: [name] is 41 y.o. female with PMH of supermorbid obesity, HFpEF, HTN, T2DM, CVD and CKDIV. Here with Flu, SOB, Vol O/l  AoCKD: I suspect this is mostly CKD, little acute component.  Dry weight appears to be around 165 kg. - continue Lasix 160mg  IV q6 hr for now + Metolazone -will need CM assistance with HD placement as patient is bed bound; PT consult also placed  Anemia: Was transfused during admission;  Will likely need ESA if possible as outpt.   Acute CHF exacerbation: Diuresis as above.  ?PNA: Cefepime per primary. Procalcitonin elevated. Influenza A: Tamiflu per primary.  HTN: stable on  on Coreg, Amlodipine, and Hydralazine.  T2DM: Per primary.    Subjective: Stable GFR overnight No new events Working with PT Discussed that she needs to have more mobility and independence  Objective: Vital signs in last 24 hours: Temp:  [98.2 F (36.8 C)-100.3 F (37.9 C)] 98.4 F (36.9 C) (02/13 0829) Pulse Rate:  [82-88] 87 (02/13 0829) Resp:  [17-24] 22 (02/13 0829) BP: (125-147)/(56-85) 128/66 (02/13 0829) SpO2:  [91 %-100 %] 96 % (02/13 0829) Weight change:   Intake/Output from previous day: 02/12 0701 - 02/13 0700 In: 1030.7 [P.O.:780; I.V.:68.7; IV Piggyback:182] Out: 3500 [Urine:3500] Intake/Output this shift: No intake/output data recorded.  General appearance: alert and cooperative Resp: on [place]. normal work of breathing this morning. Coarse breath sounds anteriorly difficult to auscultate due to body habitus  Cardio: RRR GI: soft, non-tender; bowel sounds normal; no masses,  no organomegaly Extremities: 2+pitting edema in bilateral extremities Neurologic: alert and answers questions appropriately   Lab Results:  Recent Labs  03/25/16 0218 03/26/16 0507  WBC 5.1 5.1  HGB 7.9* 8.4*  HCT 24.7* 26.7*  PLT 253 265   BMET:   Recent Labs  03/25/16 0934 03/26/16 0507  NA 138 138  K 3.9 4.1  CL 110 106  CO2 17* 17*  GLUCOSE  76 64*  BUN 59* 61*  CREATININE 5.06* 5.17*  CALCIUM 7.6* 8.0*   No results for input(s): PTH in the last 72 hours. Iron Studies:  No results for input(s): IRON, TIBC, TRANSFERRIN, FERRITIN in the last 72 hours. CBG (last 3)   Recent Labs  03/25/16 1149 03/25/16 1556 03/26/16 0830  GLUCAP 72 78 61*     Studies/Results: No results found.   LOS: 4 days

## 2016-03-26 NOTE — Progress Notes (Signed)
PROGRESS NOTE    [name]  WUJ:[id number] DOB: [birth year] DOA: 03/22/2016 PCP: Marletta Lor, NP    Brief Narrative:   [name] is a 41 y.o. female with a past medical history significant for HFpEF, CKD V baseline Cr 4.5 not on HD, hx of CVA with residual def, IDDM, and HTN who presented with respiratory distress for 1 day.  The patient was in her usual state of health until the last few days when she developed "a cold", with cough, congestion which she and her husband caught this week from their children, who had colds last weekend.  Then, through the day yesterday, her husband noticed she was having more and more trouble breathing.  By late last night, she seemed altered and was struggling to breathe, so he called 9-1-1.  She has chronic swelling, including massive swelling in her right arm.   She has chronic orthopnea, no change from baseline.  She has not had purulent sputum.  She has been adherent to her medications, specifically her torsemide. Patient was admitted placed on BiPAP in the step down unit and on high-dose Lasix 160 mg every 6 hours as well as Zaroxolyn. Patient also noted to be anemic with hemoglobin dropping as low as 6.3 and patient transfused 2 units packed red blood cells. Nephrology following.    Assessment & Plan:   Principal Problem:   Acute respiratory failure with hypoxia (HCC) Active Problems:   Acute on chronic diastolic CHF (congestive heart failure) (HCC)   Influenza A   HCAP (healthcare-associated pneumonia)   Morbid obesity (HCC)   Chronic kidney disease (CKD), stage IV (severe) (HCC)   Essential hypertension   Acute respiratory failure (HCC)   History of CVA with residual deficit   Anemia of chronic disease   Lymphedema   Uncontrolled type 2 diabetes mellitus with complication, with long-term current use of insulin (HCC)   Symptomatic anemia   Arm swelling   Flu-like symptoms   Hypertensive emergency   Metabolic  acidosis   Acute kidney injury superimposed on CKD (HCC)  #1 acute respiratory failure with hypoxia Likely multifactorial secondary to acute on chronic diastolic heart failure, influenza A, probable healthcare associated pneumonia and morbid obesity and anemia. Patient off the BiPAP this morning and currently on 3 L nasal cannula speaking in full sentences.  Patient does state some improvement with shortness of breath. Patient with a urine output of 3.5 L over the past 24 hours and is - 4.299 L over the past 24 hours. Cardiac enzymes not cycled on admission. 2-D echo with a EF of 55-60%, moderate LVH, grade 2 diastolic dysfunction with elevated left ventricular end-diastolic pressure, small mobile echodensity attached the left coronary cusp more prominent than previously described nodule of arantius, mitral valve leaflets thickened. Influenza PCR positive for influenza A. continue high-dose Lasix 160 mg every 6 hours and Zaroxolyn, strict I's and O's, daily weights, Tamiflu.. Continue Pulmicort, Brovana, scheduled nebulizers. If patient with no significant diuresis with IV Lasix and Zaroxolyn may require hemodialysis. Discontinue IV cefepime and placed on oral Augmentin. BiPAP as needed. Nephrology following and appreciate input and recommendations.  #2 acute on chronic diastolic heart failure/anasarca Questionable etiology. 2-D echo with a EF of 55-60%, moderate LVH, grade 2 diastolic dysfunction, small mobile echodensity attached to the left coronary cusp more prominent than previously described nodule of arantius. Patient with a urine output of 3.5 L over the past 24 hours and is - 4.299 L over the past 24  hours. Patient on Lasix 160 mg IV every 6 hours and Zaroxolyn 10 mg daily. Blood cultures pending. If Blood cultures are positive will require a TEE to rule out endocarditis as 2-D echo with a more prominent mobile echodensity noted on left coronary cusps. Continue Coreg, hydralazine,imdur. If patient  does not have any significant diuresis may require hemodialysis. Nephrology following.  #3 influenza A Tamiflu. Nebulizer treatments. Supportive care.  #4 probable HCAP Chest x-ray on admission. Patient had presented with upper respiratory symptoms which could all likely be secondary to influenza A. Blood cultures pending. Sputum Gram stain and culture. Urine strep pneumococcus antigen negative. Urine Legionella antigen pending. MRSA screen negative. D/C IV cefepime and placed on oral Augmentin. Patient received a dose of IV vancomycin 1 however vancomycin held secondary to acute on chronic kidney disease. DC IV vancomycin. Continue Mucinex. Nebulizer treatments, Pulmicort, Brovana.   #5 acute on chronic kidney disease IV ( baseline creatinine 3.5-5) Likely secondary to poor flow state from acute on chronic diastolic heart failure. Creatinine currently at 5.17 from 5.06 from 5.20 from 5.42 on admission from 4.59 on 03/09/2016. Patient on high-dose IV Lasix at 160 mg every 6 hours and Zaroxolyn 10 mg daily with a urine output of 3.5 L over the past 24 hours. Patient is - 4.299 L over the past 24 hours.Continue Lasix 160 mg IV every 6 hours and Zaroxolyn to10 mg daily. Strict I's and O's. Per Nephrology patient is noted to have a dry weight of 165 kg. If no significant improvement with volume overload on IV Lasix patient may require hemodialysis. Appreciate nephrology input and recommendations.  #6 anemia of renal disease/Iron deficiency anemia/symptomatic anemia Patient transfused a unit of packed red blood cells during last hospitalization with a hemoglobin of 7.5 03/09/2016. Hemoglobin currently at 8.4 from 6.3 (03/24/2016) after 2 units packed red blood cells transfusion on 03/24/2016.Marland Kitchen Patient denies any overt bleeding. Patient stated that recently had her menstrual cycle a couple of days prior to admission. On prior anemia panel, she noted to be iron deficient. Patient states she is on oral iron  tablets at home. FOBT pending. Follow H&H. Transfusion threshold hemoglobin less than 7.  #7 morbid obesity  #8 well-controlled insulin-dependent diabetes mellitus Hemoglobin A1c was 5.2 on 03/09/2016. CBG 61-79. Patient with poor oral intake. Change D5NS TO D10 at [facility]. SSI  #9 Metabolic acidosis Likely secondary to AoCKD and uremia. Improving on bicarbonate tablets. Per renal.  #10 hypertensive emergency Patient was on Nitro drip with improvement in blood pressure. Nitro drip has been discontinued. Blood pressure improved. Continue Norvasc, Coreg, hydralazine and uptitrate if needed.   #11 right upper extremity edema Noted since last hospitalization where patient had Dopplers done which were negative for DVT. Likely secondary to volume overload as well as possible lymphedema. Right upper extremity edema improved significantly with diuresis. Monitor.  #12 debility Patient noted to be bedbound and states he uses a wheelchair. Patient has been seen by physical therapy were recommending skilled nursing facility.     DVT prophylaxis: Heparin Code Status: Full Family Communication: (Specify name, relationship & date discussed. NO "discussed with patient") Disposition Plan: Remain the step down unit today. Patient requiring BiPAP prn. Likely home vs SNF, once acute on chronic diastolic heart failure is compensated and acute on chronic kidney disease has improved and patient back to baseline and has been assessed by PT/OT and per nephrology.   Consultants:   Nephrology: Dr. Lowell Guitar 03/22/2016  Procedures:   Chest x-ray 03/22/2016  2-D echo 03/22/2016  2 units packed red blood cells 03/24/2016  Antimicrobials:   IV cefepime 03/22/2016>>>>03/26/2016  IV vancomycin 1 dose.>>> 03/24/2016  Tamiflu 03/23/2016  Augmentin 03/26/2016   Subjective: Patient currently off BiPAP per RT as patient was noted to be significantly short of breath with some use of accessory muscles of  respiration. Patient with some complaints of shortness of breath this morning however speaking in full sentences on the BiPAP. No chest pain. Patient also noted to have low blood glucose levels with a CBG as low as 57.   Objective: Vitals:   03/26/16 0500 03/26/16 0600 03/26/16 0829 03/26/16 0908  BP:  125/70 128/66 134/65  Pulse: 88 87 87 87  Resp: 18 (!) 21 (!) 22 (!) 24  Temp:   98.4 F (36.9 C)   TempSrc:   Oral   SpO2: 100% 100% 96% 96%  Weight:      Height:        Intake/Output Summary (Last 24 hours) at 03/26/16 0940 Last data filed at 03/26/16 0500  Gross per 24 hour  Intake           851.67 ml  Output             3500 ml  Net         -2648.33 ml   Filed Weights   03/23/16 0457 03/24/16 0500 03/25/16 0739  Weight: (!) 183.1 kg (403 lb 11.2 oz) (!) 183.4 kg (404 lb 6.4 oz) (!) 182.2 kg (401 lb 9.6 oz)    Examination:  General exam: On 3L [place]. Anasarca. Respiratory system: Diffuse crackles. Coarse/rhonchorus breath sounds.  Cardiovascular system: Distant heart sounds secondary to body habitus. S1 & S2 heard, RRR. No JVD, murmurs, rubs, gallops or clicks. 2-3+ bilateral lower extremity up to lower abdomen. Gastrointestinal system: Abdomen is nondistended, soft and nontender. No organomegaly or masses felt. Normal bowel sounds heard. Central nervous system: Alert and oriented. No focal neurological deficits. Extremities: 2-3+ bilateral lower extremity edema up to lower abdomen. Right upper extremity edema improving and was ongoing since last hospitalization. Skin: No rashes, lesions or ulcers Psychiatry: Judgement and insight appear normal. Mood & affect appropriate.     Data Reviewed: I have personally reviewed following labs and imaging studies  CBC:  Recent Labs Lab 03/22/16 0602 03/23/16 0018 03/24/16 0332 03/24/16 2046 03/25/16 0218 03/26/16 0507  WBC 9.3 7.2 5.8 5.6 5.1 5.1  NEUTROABS 7.8*  --   --   --   --   --   HGB 7.4* 7.1* 6.3* 8.0* 7.9* 8.4*    HCT 23.8* 23.1* 20.4* 25.3* 24.7* 26.7*  MCV 88.5 88.8 87.6 85.5 84.3 86.4  PLT 317 275 267 237 253 265   Basic Metabolic Panel:  Recent Labs Lab 03/22/16 0602 03/23/16 0018 03/24/16 0332 03/25/16 0934 03/26/16 0507  NA 138 138 137 138 138  K 4.5 4.6 4.2 3.9 4.1  CL 107 109 106 110 106  CO2 15* 15* 16* 17* 17*  GLUCOSE 87 81 68 76 64*  BUN 54* 57* 59* 59* 61*  CREATININE 5.42* 5.31* 5.20* 5.06* 5.17*  CALCIUM 8.7* 8.5* 8.1* 7.6* 8.0*  MG  --  1.9  --   --   --   PHOS  --   --   --  5.8* 5.7*   GFR: Estimated Creatinine Clearance: 25 mL/min (by C-G formula based on SCr of 5.17 mg/dL (H)). Liver Function Tests:  Recent Labs Lab 03/22/16 0602 03/25/16 0934 03/26/16  0507  AST 8*  --   --   ALT 7*  --   --   ALKPHOS 31*  --   --   BILITOT 0.6  --   --   PROT 7.1  --   --   ALBUMIN 2.7* 2.1* 2.1*   No results for input(s): LIPASE, AMYLASE in the last 168 hours. No results for input(s): AMMONIA in the last 168 hours. Coagulation Profile: No results for input(s): INR, PROTIME in the last 168 hours. Cardiac Enzymes: No results for input(s): CKTOTAL, CKMB, CKMBINDEX, TROPONINI in the last 168 hours. BNP (last 3 results) No results for input(s): PROBNP in the last 8760 hours. HbA1C: No results for input(s): HGBA1C in the last 72 hours. CBG:  Recent Labs Lab 03/25/16 0743 03/25/16 0925 03/25/16 1149 03/25/16 1556 03/26/16 0830  GLUCAP 64* 79 72 78 61*   Lipid Profile:  Recent Labs  03/24/16 0332  CHOL 185  HDL 27*  LDLCALC 119*  TRIG 194*  CHOLHDL 6.9   Thyroid Function Tests: No results for input(s): TSH, T4TOTAL, FREET4, T3FREE, THYROIDAB in the last 72 hours. Anemia Panel: No results for input(s): VITAMINB12, FOLATE, FERRITIN, TIBC, IRON, RETICCTPCT in the last 72 hours. Sepsis Labs:  Recent Labs Lab 03/22/16 0553 03/23/16 0018 03/24/16 0332 03/26/16 0507  PROCALCITON  --  2.45 3.01 1.78  LATICACIDVEN 0.61  --   --   --     Recent  Results (from the past 240 hour(s))  Blood culture (routine x 2)     Status: None (Preliminary result)   Collection Time: 03/22/16  5:30 AM  Result Value Ref Range Status   Specimen Description BLOOD LEFT HAND  Final   Special Requests BOTTLES DRAWN AEROBIC ONLY  Final   Culture NO GROWTH 3 DAYS  Final   Report Status PENDING  Incomplete  Blood culture (routine x 2)     Status: None (Preliminary result)   Collection Time: 03/22/16  6:18 AM  Result Value Ref Range Status   Specimen Description BLOOD LEFT HAND  Final   Special Requests IN PEDIATRIC BOTTLE  Final   Culture NO GROWTH 3 DAYS  Final   Report Status PENDING  Incomplete  MRSA PCR Screening     Status: None   Collection Time: 03/22/16  9:51 PM  Result Value Ref Range Status   MRSA by PCR NEGATIVE NEGATIVE Final    Comment:        The GeneXpert MRSA Assay (FDA approved for NASAL specimens only), is one component of a comprehensive MRSA colonization surveillance program. It is not intended to diagnose MRSA infection nor to guide or monitor treatment for MRSA infections.          Radiology Studies: No results found.      Scheduled Meds: . sodium chloride   Intravenous Once  . amLODipine  10 mg Oral Daily  . arformoterol  15 mcg Nebulization BID  . budesonide (PULMICORT) nebulizer solution  0.25 mg Nebulization BID  . carvedilol  25 mg Oral BID WC  . ceFEPime (MAXIPIME) IV  1 g Intravenous Q24H  . chlorhexidine  15 mL Mouth Rinse BID  . ferrous sulfate  325 mg Oral TID WC  . furosemide  160 mg Intravenous Q6H  . heparin  5,000 Units Subcutaneous Q8H  . hydrALAZINE  50 mg Oral TID  . insulin aspart  0-15 Units Subcutaneous TID WC  . ipratropium-albuterol  3 mL Nebulization TID  . isosorbide  mononitrate  30 mg Oral Daily  . mouth rinse  15 mL Mouth Rinse q12n4p  . metolazone  10 mg Oral Daily  . oseltamivir  30 mg Oral Daily  . potassium chloride  40 mEq Oral Daily  . sodium bicarbonate  650 mg  Oral TID  . sodium chloride flush  3 mL Intravenous Q12H   Continuous Infusions: . dextrose 5 % and 0.9% NaCl 10 mL/hr at 03/25/16 1126     LOS: 4 days    Time spent: 40 minutes    Lotoya Casella, MD [facility] Pager [phone]  If 7PM-7AM, please contact night-coverage [email or web address] Password [facility] 03/26/2016, 9:40 AM

## 2016-03-27 LAB — RENAL FUNCTION PANEL
Albumin: 2.3 g/dL — ABNORMAL LOW (ref 3.5–5.0)
Anion gap: 14 (ref 5–15)
BUN: 61 mg/dL — AB (ref 6–20)
CO2: 19 mmol/L — ABNORMAL LOW (ref 22–32)
Calcium: 8.4 mg/dL — ABNORMAL LOW (ref 8.9–10.3)
Chloride: 107 mmol/L (ref 101–111)
Creatinine, Ser: 5.29 mg/dL — ABNORMAL HIGH (ref 0.44–1.00)
GFR calc Af Amer: 11 mL/min — ABNORMAL LOW (ref >60)
GFR, EST NON AFRICAN AMERICAN: 9 mL/min — AB (ref >60)
Glucose, Bld: 70 mg/dL (ref 65–99)
POTASSIUM: 4.2 mmol/L (ref 3.5–5.1)
Phosphorus: 5.4 mg/dL — ABNORMAL HIGH (ref 2.5–4.6)
Sodium: 140 mmol/L (ref 135–145)

## 2016-03-27 LAB — CULTURE, BLOOD (ROUTINE X 2)
CULTURE: NO GROWTH
Culture: NO GROWTH

## 2016-03-27 LAB — GLUCOSE, CAPILLARY
GLUCOSE-CAPILLARY: 68 mg/dL (ref 65–99)
GLUCOSE-CAPILLARY: 66 mg/dL (ref 65–99)
GLUCOSE-CAPILLARY: 60 mg/dL — AB (ref 65–99)
Glucose-Capillary: 64 mg/dL — ABNORMAL LOW (ref 65–99)
Glucose-Capillary: 74 mg/dL (ref 65–99)

## 2016-03-27 LAB — CBC WITH DIFFERENTIAL/PLATELET
BASOS ABS: 0 10*3/uL (ref 0.0–0.1)
Basophils Relative: 0 %
EOS PCT: 6 %
Eosinophils Absolute: 0.3 10*3/uL (ref 0.0–0.7)
HEMATOCRIT: 28.4 % — AB (ref 36.0–46.0)
Hemoglobin: 9.1 g/dL — ABNORMAL LOW (ref 12.0–15.0)
Lymphocytes Relative: 28 %
Lymphs Abs: 1.6 10*3/uL (ref 0.7–4.0)
MCH: 27.2 pg (ref 26.0–34.0)
MCHC: 32 g/dL (ref 30.0–36.0)
MCV: 85 fL (ref 78.0–100.0)
MONOS PCT: 7 %
Monocytes Absolute: 0.4 10*3/uL (ref 0.1–1.0)
NEUTROS PCT: 59 %
Neutro Abs: 3.4 10*3/uL (ref 1.7–7.7)
PLATELETS: 287 10*3/uL (ref 150–400)
RBC: 3.34 MIL/uL — ABNORMAL LOW (ref 3.87–5.11)
RDW: 16.6 % — AB (ref 11.5–15.5)
WBC: 5.7 10*3/uL (ref 4.0–10.5)

## 2016-03-27 MED ORDER — HYDRALAZINE HCL 20 MG/ML IJ SOLN
10.0000 mg | INTRAMUSCULAR | Status: DC | PRN
  Administered 2016-03-27 – 2016-03-28 (×2): 10 mg via INTRAVENOUS
  Filled 2016-03-27 (×2): qty 1

## 2016-03-27 MED ORDER — DEXTROSE 50 % IV SOLN
INTRAVENOUS | Status: AC
  Administered 2016-03-27: 50 mL
  Filled 2016-03-27: qty 50

## 2016-03-27 MED ORDER — HYDRALAZINE HCL 50 MG PO TABS
100.0000 mg | ORAL_TABLET | Freq: Three times a day (TID) | ORAL | Status: DC
  Administered 2016-03-27: 100 mg via ORAL
  Filled 2016-03-27 (×2): qty 2

## 2016-03-27 NOTE — Progress Notes (Signed)
PROGRESS NOTE    [name]  ZOX:[id number] DOB: [birth year] DOA: 03/22/2016 PCP: Marletta Lor, NP   Brief Narrative: 41 y.o.femalewith a past medical history significant for HFpEF, CKD IV baseline Cr 4.5 not on HD, hx of CVA with residual def, IDDM, and HTN, morbid obesitywho presented with respiratory distress.  Assessment & Plan:  # Acute on chronic diastolic heart failure with anasarca: - Echocardiogram with EF of 55-60%, grade 2 diastolic dysfunction -Continue IV diuretics, on IV Lasix 160 mg every 6 hour with metolazone. -Has urine output of 4400/24 hour and net negative by 7 L since admission. -Continue current cardiac medications including Coreg, hydralazine and Imdur and diuretics.  #Acute on chronic kidney disease stage IV: Likely hemodynamically mediated in the setting of congestive heart failure. -Currently on IV diuretics, monitor BMP. Serum creatinine level is stable. Avoid nephrotoxins. -Nephrology consult appreciated.  #Influenza A positive: Continue bronchodilators, Tamiflu. Continue supportive care.  #probable HCAP: On Augmentin. Continue nebulizing treatment.  #Acute hypoxic respiratory failure: On room air this morning.  #Hypertension: Patient had hypertensive emergency on admission requiring nitroglycerin drip. On high-dose IV diuretics. Continue current antihypertensive medications. Monitor blood pressure.  #Anemia of chronic kidney disease: Iron stores acceptable. Received a red blood cell transfusion during this hospitalization. Monitor CBC.  #Physical deconditioning, morbid obesity, hypoglycemia due to decreased oral intake: I will consult palliative care for discussion regarding goals of care. PT OT evaluation. -Encourage oral intake. Patient is currently on D10.  Principal Problem:   Acute respiratory failure with hypoxia (HCC) Active Problems:   Morbid obesity (HCC)   Chronic kidney disease (CKD), stage IV (severe) (HCC)   Essential  hypertension   Acute respiratory failure (HCC)   History of CVA with residual deficit   Anemia of chronic disease   Lymphedema   Uncontrolled type 2 diabetes mellitus with complication, with long-term current use of insulin (HCC)   Symptomatic anemia   Arm swelling   Acute on chronic diastolic CHF (congestive heart failure) (HCC)   Influenza A   HCAP (healthcare-associated pneumonia)   Flu-like symptoms   Hypertensive emergency   Metabolic acidosis   Acute kidney injury superimposed on CKD (HCC)   Acute pulmonary edema (HCC)  DVT prophylaxis: Heparin subcutaneous Code Status: Full code Family Communication: No family present at bedside Disposition Plan: Likely discharge to nursing home in 2-3 days.  Consultants:   Nephrologist  Procedures:  2-D echo 03/22/2016  2 units packed red blood cells 03/24/2016 Antimicrobials:  IV cefepime 03/22/2016>>>>03/26/2016  IV vancomycin 1 dose.>>> 03/24/2016  Tamiflu 03/23/2016  Augmentin 03/26/2016 Subjective: Patient was seen and examined at bedside. She wanted to get up and sit on the bed. Nurse was present at bedside. Denied chest pain, shortness of breath. No new event.  Objective: Vitals:   03/27/16 0435 03/27/16 0700 03/27/16 0900 03/27/16 1100  BP:    (!) 168/106  Pulse:   96   Resp:   (!) 21   Temp: 98.9 F (37.2 C) 99.2 F (37.3 C)  98.1 F (36.7 C)  TempSrc: Oral Axillary  Oral  SpO2:      Weight:      Height:        Intake/Output Summary (Last 24 hours) at 03/27/16 1151 Last data filed at 03/27/16 1109  Gross per 24 hour  Intake              536 ml  Output             4250  ml  Net            -3714 ml   Filed Weights   03/24/16 0500 03/25/16 0739 03/27/16 0301  Weight: (!) 183.4 kg (404 lb 6.4 oz) (!) 182.2 kg (401 lb 9.6 oz) (!) 175 kg (385 lb 11.2 oz)    Examination:  General exam: Morbidly obese female lying on bed comfortable  Respiratory system: Bibasal decreased breath sound, + crackles. No  wheezing Cardiovascular system: S1 & S2 heard, RRR.   Gastrointestinal system: Abdomen is  soft and nontender. Normal bowel sounds heard. Central nervous system: Alert awake and following commands. Extremities: Bilateral lower extremities chronic venous stasis changes with edema  Skin: No other rashes, lesions or ulcers     Data Reviewed: I have personally reviewed following labs and imaging studies  CBC:  Recent Labs Lab 03/22/16 0602  03/24/16 0332 03/24/16 2046 03/25/16 0218 03/26/16 0507 03/27/16 0241  WBC 9.3  < > 5.8 5.6 5.1 5.1 5.7  NEUTROABS 7.8*  --   --   --   --   --  3.4  HGB 7.4*  < > 6.3* 8.0* 7.9* 8.4* 9.1*  HCT 23.8*  < > 20.4* 25.3* 24.7* 26.7* 28.4*  MCV 88.5  < > 87.6 85.5 84.3 86.4 85.0  PLT 317  < > 267 237 253 265 287  < > = values in this interval not displayed. Basic Metabolic Panel:  Recent Labs Lab 03/23/16 0018 03/24/16 0332 03/25/16 0934 03/26/16 0507 03/27/16 0241  NA 138 137 138 138 140  K 4.6 4.2 3.9 4.1 4.2  CL 109 106 110 106 107  CO2 15* 16* 17* 17* 19*  GLUCOSE 81 68 76 64* 70  BUN 57* 59* 59* 61* 61*  CREATININE 5.31* 5.20* 5.06* 5.17* 5.29*  CALCIUM 8.5* 8.1* 7.6* 8.0* 8.4*  MG 1.9  --   --   --   --   PHOS  --   --  5.8* 5.7* 5.4*   GFR: Estimated Creatinine Clearance: 23.8 mL/min (by C-G formula based on SCr of 5.29 mg/dL (H)). Liver Function Tests:  Recent Labs Lab 03/22/16 0602 03/25/16 0934 03/26/16 0507 03/27/16 0241  AST 8*  --   --   --   ALT 7*  --   --   --   ALKPHOS 31*  --   --   --   BILITOT 0.6  --   --   --   PROT 7.1  --   --   --   ALBUMIN 2.7* 2.1* 2.1* 2.3*   No results for input(s): LIPASE, AMYLASE in the last 168 hours. No results for input(s): AMMONIA in the last 168 hours. Coagulation Profile: No results for input(s): INR, PROTIME in the last 168 hours. Cardiac Enzymes: No results for input(s): CKTOTAL, CKMB, CKMBINDEX, TROPONINI in the last 168 hours. BNP (last 3 results) No results  for input(s): PROBNP in the last 8760 hours. HbA1C: No results for input(s): HGBA1C in the last 72 hours. CBG:  Recent Labs Lab 03/26/16 1943 03/26/16 2112 03/26/16 2248 03/27/16 0731 03/27/16 1114  GLUCAP 73 66 86 74 68   Lipid Profile: No results for input(s): CHOL, HDL, LDLCALC, TRIG, CHOLHDL, LDLDIRECT in the last 72 hours. Thyroid Function Tests: No results for input(s): TSH, T4TOTAL, FREET4, T3FREE, THYROIDAB in the last 72 hours. Anemia Panel: No results for input(s): VITAMINB12, FOLATE, FERRITIN, TIBC, IRON, RETICCTPCT in the last 72 hours. Sepsis Labs:  Recent Labs Lab 03/22/16 [phone]  03/23/16 0018 03/24/16 0332 03/26/16 0507  PROCALCITON  --  2.45 3.01 1.78  LATICACIDVEN 0.61  --   --   --     Recent Results (from the past 240 hour(s))  Blood culture (routine x 2)     Status: None (Preliminary result)   Collection Time: 03/22/16  5:30 AM  Result Value Ref Range Status   Specimen Description BLOOD LEFT HAND  Final   Special Requests BOTTLES DRAWN AEROBIC ONLY 5ML  Final   Culture NO GROWTH 4 DAYS  Final   Report Status PENDING  Incomplete  Blood culture (routine x 2)     Status: None (Preliminary result)   Collection Time: 03/22/16  6:18 AM  Result Value Ref Range Status   Specimen Description BLOOD LEFT HAND  Final   Special Requests IN PEDIATRIC BOTTLE 4ML  Final   Culture NO GROWTH 4 DAYS  Final   Report Status PENDING  Incomplete  MRSA PCR Screening     Status: None   Collection Time: 03/22/16  9:51 PM  Result Value Ref Range Status   MRSA by PCR NEGATIVE NEGATIVE Final    Comment:        The GeneXpert MRSA Assay (FDA approved for NASAL specimens only), is one component of a comprehensive MRSA colonization surveillance program. It is not intended to diagnose MRSA infection nor to guide or monitor treatment for MRSA infections.          Radiology Studies: No results found.      Scheduled Meds: . sodium chloride   Intravenous Once    . amLODipine  10 mg Oral Daily  . amoxicillin-clavulanate  1 tablet Oral Q12H  . arformoterol  15 mcg Nebulization BID  . budesonide (PULMICORT) nebulizer solution  0.25 mg Nebulization BID  . carvedilol  25 mg Oral BID WC  . chlorhexidine  15 mL Mouth Rinse BID  . ferrous sulfate  325 mg Oral TID WC  . furosemide  160 mg Intravenous Q6H  . heparin  5,000 Units Subcutaneous Q8H  . hydrALAZINE  50 mg Oral TID  . insulin aspart  0-15 Units Subcutaneous TID WC  . ipratropium-albuterol  3 mL Nebulization TID  . isosorbide mononitrate  30 mg Oral Daily  . mouth rinse  15 mL Mouth Rinse q12n4p  . metolazone  10 mg Oral Daily  . oseltamivir  30 mg Oral Daily  . potassium chloride  40 mEq Oral Daily  . sodium bicarbonate  650 mg Oral TID  . sodium chloride flush  3 mL Intravenous Q12H   Continuous Infusions: . dextrose 10 % 1,000 mL infusion 10 mL/hr at 03/26/16 1723     LOS: 5 days    [name] Linda CollinsPrasad Jacarra Bobak, MD [facility] Pager [phone]  If 7PM-7AM, please contact night-coverage [email or web address] Password [email or web address] 03/27/2016, 11:51 AM

## 2016-03-27 NOTE — Progress Notes (Signed)
Assessment/Plan: [name] is 41 y.o. female with PMH of supermorbid obesity, HFpEF, HTN, T2DM, CVD and CKDIV. Here with Flu, SOB, Vol O/l  AoCKD: I suspect this is mostly CKD, little acute component.  Dry weight appears to be around 165 kg. - continue Lasix 160mg  IV q6 hr for now + Metolazone; consider decreasing dose to improvement in UOP and Cr now uptrending again  -will need CM assistance with HD placement as patient is bed bound; PT consult also placed >> recommending SNF placement  Anemia: Was transfused during admission;  Will likely need ESA if possible as outpt.   Acute CHF exacerbation: Diuresis as above.  ?PNA: Cefepime per primary. Procalcitonin elevated. Influenza A: Tamiflu per primary.  HTN: stable on  on Coreg, Amlodipine, and Hydralazine.  T2DM: Per primary.    Subjective: Stable GFR overnight No new events Good UOP with 4.4L out   Objective: Vital signs in last 24 hours: Temp:  [98.4 F (36.9 C)-99.2 F (37.3 C)] 99.2 F (37.3 C) (02/14 0700) Pulse Rate:  [85-88] 85 (02/14 0023) Resp:  [18-25] 18 (02/14 0023) BP: (128-169)/(64-97) 165/97 (02/14 0023) SpO2:  [93 %-100 %] 93 % (02/14 0023) Weight:  [385 lb 11.2 oz (175 kg)] 385 lb 11.2 oz (175 kg) (02/14 0301) Weight change: -15 lb 14.4 oz (-7.212 kg)  Intake/Output from previous day: 02/13 0701 - 02/14 0700 In: 659 [P.O.:420; I.V.:123; IV Piggyback:116] Out: 4400 [Urine:4400] Intake/Output this shift: Total I/O In: -  Out: 150 [Urine:150]  General appearance: alert and cooperative Resp: on RA  normal work of breathing this morning. Coarse breath sounds anteriorly difficult to auscultate due to body habitus  Cardio: RRR GI: soft, non-tender; bowel sounds normal; no masses,  no organomegaly Extremities: 2+pitting edema in bilateral extremities Neurologic: alert and answers questions appropriately   Lab Results:  Recent Labs  03/26/16 0507 03/27/16 0241  WBC 5.1 5.7  HGB 8.4* 9.1*   HCT 26.7* 28.4*  PLT 265 287   BMET:   Recent Labs  03/26/16 0507 03/27/16 0241  NA 138 140  K 4.1 4.2  CL 106 107  CO2 17* 19*  GLUCOSE 64* 70  BUN 61* 61*  CREATININE 5.17* 5.29*  CALCIUM 8.0* 8.4*   No results for input(s): PTH in the last 72 hours. Iron Studies:  No results for input(s): IRON, TIBC, TRANSFERRIN, FERRITIN in the last 72 hours. CBG (last 3)   Recent Labs  03/26/16 2112 03/26/16 2248 03/27/16 0731  GLUCAP 66 86 74     Studies/Results: No results found.   LOS: 5 days   [name], D.O. 03/27/2016, 8:01 AM PGY-2, [facility]

## 2016-03-27 NOTE — Progress Notes (Signed)
Attempted to give patient evening medication. Patient spit out medication. Patient educated on need to take medication. Patient continued to refuse. Dr. Ronalee BeltsBhandari notified. New order given.

## 2016-03-27 NOTE — Progress Notes (Signed)
Physical Therapy Treatment Patient Details Name: [name] MRN: [id number] DOB: 06/19/1975 Today's Date: 03/27/2016    History of Present Illness [name] is 41 y.o. female with PMH of HFpEF, HTN, T2DM, CVD and CKDIV. Cr baseline difficult to assess as has been rising and labile over recent hospital admission. Values have varied between 3.5-5. Patient presented with respiratory distress thought to be 2/2 to CHF exacerbation vs. CAP +flu. Pt with swelling, especially RUE.    PT Comments    Patient see for mobility progression OOB. Patient required 2 to 3 person assist for bed mobility, hygiene and functional tasks. Assisted patient OOB to shuttle chair using maxi slide sheets. Tolerated well. Patient cognition appears to be questionable this session (see below). SNF remains appropriate.  Follow Up Recommendations  SNF     Equipment Recommendations  None recommended by PT    Recommendations for Other Services       Precautions / Restrictions Precautions Precautions: Fall Restrictions Weight Bearing Restrictions: No    Mobility  Bed Mobility Overal bed mobility: Needs Assistance Bed Mobility: Rolling Rolling: Max assist;+2 for physical assistance   Supine to sit: Mod assist     General bed mobility comments: max assist for rolling for pericare and hygiene, moderate assist to sit upright in bed with HOB elevated to 60 degrees  Transfers Overall transfer level: Needs assistance Equipment used:  (using maxislide sheets) Transfers: Lateral/Scoot Transfers (draw sheet transfer via maxislide sheets)          Lateral/Scoot Transfers: Total assist;+2 safety/equipment;+2 physical assistance (+3) General transfer comment: Total assist use of shuttle chair to come to upright sitting  Ambulation/Gait             General Gait Details: not safe to perform   Stairs            Wheelchair Mobility    Modified Rankin (Stroke Patients  Only)       Balance Overall balance assessment: Needs assistance Sitting-balance support: No upper extremity supported Sitting balance-Leahy Scale: Fair                              Cognition Arousal/Alertness: Awake/alert Behavior During Therapy: Anxious;Impulsive Overall Cognitive Status: Impaired/Different from baseline Area of Impairment: Orientation;Attention;Following commands;Safety/judgement;Awareness;Problem solving Orientation Level: Disoriented to;Situation;Place;Time Current Attention Level: Focused   Following Commands: Follows one step commands inconsistently;Follows one step commands with increased time Safety/Judgement: Decreased awareness of safety;Decreased awareness of deficits Awareness: Intellectual Problem Solving: Slow processing;Decreased initiation;Difficulty sequencing;Requires tactile cues;Requires verbal cues General Comments: Patient very labile, crying out but given no reason why. Upon checking, patient incontient of stool, patient with decreased awareness and acknowledgement. Patient also not oriented to time place or situation at this time.    Exercises      General Comments        Pertinent Vitals/Pain Pain Assessment: Faces Faces Pain Scale: Hurts little more Pain Location: generalized Pain Descriptors / Indicators: Grimacing Pain Intervention(s): Monitored during session    Home Living                      Prior Function            PT Goals (current goals can now be found in the care plan section) Acute Rehab PT Goals Patient Stated Goal: to get OOB PT Goal Formulation: With patient Time For Goal Achievement: 04/08/16 Potential to Achieve Goals: Fair Progress towards PT  goals: Progressing toward goals    Frequency    Min 2X/week      PT Plan Current plan remains appropriate    Co-evaluation             End of Session   Activity Tolerance: Patient tolerated treatment well Patient left: in  chair;with call bell/phone within reach     Time: 4098-1191 PT Time Calculation (min) (ACUTE ONLY): 26 min  Charges:  $Therapeutic Activity: 23-37 mins                    G Codes:      Fabio Asa 04/19/16, 1:18 PM Charlotte Crumb, PT DPT  [phone]

## 2016-03-28 DIAGNOSIS — Z7189 Other specified counseling: Secondary | ICD-10-CM

## 2016-03-28 DIAGNOSIS — Z515 Encounter for palliative care: Secondary | ICD-10-CM

## 2016-03-28 DIAGNOSIS — R5381 Other malaise: Secondary | ICD-10-CM

## 2016-03-28 LAB — RENAL FUNCTION PANEL
ALBUMIN: 2.1 g/dL — AB (ref 3.5–5.0)
Anion gap: 13 (ref 5–15)
BUN: 63 mg/dL — AB (ref 6–20)
CO2: 20 mmol/L — ABNORMAL LOW (ref 22–32)
CREATININE: 5.14 mg/dL — AB (ref 0.44–1.00)
Calcium: 8.5 mg/dL — ABNORMAL LOW (ref 8.9–10.3)
Chloride: 108 mmol/L (ref 101–111)
GFR, EST AFRICAN AMERICAN: 11 mL/min — AB (ref >60)
GFR, EST NON AFRICAN AMERICAN: 10 mL/min — AB (ref >60)
Glucose, Bld: 74 mg/dL (ref 65–99)
PHOSPHORUS: 5.1 mg/dL — AB (ref 2.5–4.6)
POTASSIUM: 3.8 mmol/L (ref 3.5–5.1)
Sodium: 141 mmol/L (ref 135–145)

## 2016-03-28 LAB — GLUCOSE, CAPILLARY
GLUCOSE-CAPILLARY: 67 mg/dL (ref 65–99)
GLUCOSE-CAPILLARY: 89 mg/dL (ref 65–99)
GLUCOSE-CAPILLARY: 75 mg/dL (ref 65–99)
Glucose-Capillary: 71 mg/dL (ref 65–99)
Glucose-Capillary: 74 mg/dL (ref 65–99)

## 2016-03-28 MED ORDER — AMOXICILLIN-POT CLAVULANATE 500-125 MG PO TABS
1.0000 | ORAL_TABLET | Freq: Two times a day (BID) | ORAL | Status: DC
  Administered 2016-03-28 – 2016-03-29 (×4): 500 mg via ORAL
  Filled 2016-03-28 (×9): qty 1

## 2016-03-28 MED ORDER — HYDRALAZINE HCL 50 MG PO TABS
50.0000 mg | ORAL_TABLET | Freq: Three times a day (TID) | ORAL | Status: DC
  Administered 2016-03-28: 50 mg via ORAL
  Filled 2016-03-28: qty 1

## 2016-03-28 MED ORDER — HYDRALAZINE HCL 50 MG PO TABS
100.0000 mg | ORAL_TABLET | Freq: Three times a day (TID) | ORAL | Status: DC
  Administered 2016-03-28 – 2016-03-30 (×7): 100 mg via ORAL
  Filled 2016-03-28 (×7): qty 2

## 2016-03-28 NOTE — Progress Notes (Signed)
Assessment/Plan: Linda DykesDavenia McKinnon [name] is 41 y.o. female with PMH of supermorbid obesity, HFpEF, HTN, T2DM, CVD and CKDIV. Here with Flu, SOB, Vol O/l  AoCKD: I suspect this is mostly CKD, little acute component.  Dry weight appears to be around 165 kg. - continue Lasix 160mg  IV q6 hr for now + Metolazone given good UOP; anticipate transition to PO diuretics  -will need CM assistance with HD placement as patient is bed bound; PT consult also placed >> recommending SNF placement  -palliative care consult for severe co-morbidities and GOC discussion  Anemia: HgB stable. Was transfused during admission;  Will likely need ESA if possible as outpt.   Acute CHF exacerbation: Diuresis as above.  ?PNA: Cefepime per primary. Procalcitonin elevated. Influenza A: Tamiflu per primary.  HTN: elevated on Coreg, Amlodipine, and Hydralazine.  T2DM: Per primary.    Subjective: Stable GFR overnight No new events Good UOP with 3.2L out  Was in bedside chair yesterday for some time    Objective: Vital signs in last 24 hours: Temp:  [98.1 F (36.7 C)-99.6 F (37.6 C)] 98.6 F (37 C) (02/15 0836) Pulse Rate:  [94-99] 97 (02/15 0836) Resp:  [17-27] 19 (02/15 0836) BP: (167-194)/(89-131) 168/95 (02/15 0836) SpO2:  [90 %-100 %] 93 % (02/15 0836) Weight:  [386 lb (175.1 kg)] 386 lb (175.1 kg) (02/15 0500) Weight change: 4.8 oz (0.136 kg)  Intake/Output from previous day: 02/14 0701 - 02/15 0700 In: 280 [I.V.:16; IV Piggyback:264] Out: 3250 [Urine:3250] Intake/Output this shift: Total I/O In: 20 [I.V.:20] Out: -   General appearance: alert and cooperative Resp: [place] in place. normal work of breathing. Coarse breath sounds anteriorly difficult to auscultate due to body habitus  Cardio: RRR GI: soft, non-tender; bowel sounds normal; no masses,  no organomegaly Extremities: 2+pitting edema in bilateral extremities Neurologic: alert and answers questions appropriately   Lab Results:  Recent  Labs  03/26/16 0507 03/27/16 0241  WBC 5.1 5.7  HGB 8.4* 9.1*  HCT 26.7* 28.4*  PLT 265 287   BMET:   Recent Labs  03/27/16 0241 03/28/16 0537  NA 140 141  K 4.2 3.8  CL 107 108  CO2 19* 20*  GLUCOSE 70 74  BUN 61* 63*  CREATININE 5.29* 5.14*  CALCIUM 8.4* 8.5*   No results for input(s): PTH in the last 72 hours. Iron Studies:  No results for input(s): IRON, TIBC, TRANSFERRIN, FERRITIN in the last 72 hours. CBG (last 3)   Recent Labs  03/27/16 2134 03/27/16 2343 03/28/16 0834  GLUCAP 60* 64* 67     Studies/Results: No results found.   LOS: 6 days   Marcy Sirenatherine Wallace, D.O. 03/28/2016, 9:21 AM PGY-2, [facility]

## 2016-03-28 NOTE — Progress Notes (Signed)
CSW received [facility] for SNF[facility] not be started on dialysis at this time and spouse plans to take patient back home and continue care in house  CSW signing off  Burna SisJenna H. Carter Kaman, LCSW Clinical Social Worker [phone]

## 2016-03-28 NOTE — Progress Notes (Signed)
PROGRESS NOTE    [name]  ZOX:[id number] DOB: 1975-12-20 DOA: 03/22/2016 PCP: Marletta Lor, NP   Brief Narrative: 41 y.o.femalewith a past medical history significant for HFpEF, CKD IV baseline Cr 4.5 not on HD, hx of CVA with residual def, IDDM, and HTN, morbid obesitywho presented with respiratory distress.  Assessment & Plan:  # Acute on chronic diastolic heart failure with anasarca: - Echocardiogram with EF of 55-60%, grade 2 diastolic dysfunction -Continue IV diuretics, on IV Lasix 160 mg every 6 hour with metolazone. -Has urine output of 3250/24 hour and net negative by 10 L since admission. -Continue current cardiac medications including Coreg, hydralazine and Imdur and diuretics.  #Acute on chronic kidney disease stage IV: Likely hemodynamically mediated in the setting of congestive heart failure. -Currently on IV diuretics, monitor BMP. Serum creatinine level is stable. Avoid nephrotoxins. -Nephrology consult appreciated.  #Influenza A positive: Continue bronchodilators, Tamiflu. Continue supportive care.  #probable HCAP: On Augmentin. Continue nebulizing treatment.  #Acute hypoxic respiratory failure: On room air this morning.  #Hypertension: Patient had hypertensive emergency on admission requiring nitroglycerin drip. On high-dose IV diuretics. Blood pressure is suboptimally controlled. I increased the dose of hydralazine 200 mg 3 times a day. Continue Norvasc, Coreg, Imdur. Patient is not taking oral medications. Continue to monitor closely.   #Anemia of chronic kidney disease: Iron stores acceptable. Received a red blood cell transfusion during this hospitalization. Monitor CBC.  #Physical deconditioning, morbid obesity, hypoglycemia due to decreased oral intake: Palliative consult requested. PT OT evaluation recommended SNF. Social worker consulted. -Encourage oral intake. Discontinue dextrose IV. It was restarted because of hypoglycemia.  Principal  Problem:   Acute respiratory failure with hypoxia (HCC) Active Problems:   Morbid obesity (HCC)   Chronic kidney disease (CKD), stage IV (severe) (HCC)   Essential hypertension   Acute respiratory failure (HCC)   History of CVA with residual deficit   Anemia of chronic disease   Lymphedema   Uncontrolled type 2 diabetes mellitus with complication, with long-term current use of insulin (HCC)   Symptomatic anemia   Arm swelling   Acute on chronic diastolic CHF (congestive heart failure) (HCC)   Influenza A   HCAP (healthcare-associated pneumonia)   Flu-like symptoms   Hypertensive emergency   Metabolic acidosis   Acute kidney injury superimposed on CKD (HCC)   Acute pulmonary edema (HCC)  DVT prophylaxis: Heparin subcutaneous Code Status: Full code Family Communication: Discussed with the patient's husband at bedside and I updated him in detail. Disposition Plan: Likely discharge to home versus nursing home in 1-2 days.  Consultants:   Nephrologist  Procedures:  2-D echo 03/22/2016  2 units packed red blood cells 03/24/2016 Antimicrobials:  IV cefepime 03/22/2016>>>>03/26/2016  IV vancomycin 1 dose.>>> 03/24/2016  Tamiflu 03/23/2016  Augmentin 03/26/2016 Subjective: Patient was seen and examined at bedside. Patient was alert awake and oriented today. Her husband at bedside. She was eating breakfast. Denied nausea vomiting chest pain or shortness of breath. Objective: Vitals:   03/28/16 0825 03/28/16 0836 03/28/16 1205 03/28/16 1418  BP: (!) 168/95 (!) 168/95 (!) 165/91 (!) 175/105  Pulse: 97 97 91 94  Resp: (!) 22 19 18  (!) 24  Temp:  98.6 F (37 C) 99 F (37.2 C)   TempSrc:  Oral Oral   SpO2: 95% 93% 96% 93%  Weight:      Height:        Intake/Output Summary (Last 24 hours) at 03/28/16 1439 Last data filed at 03/28/16 1207  Gross per 24 hour  Intake              303 ml  Output             3350 ml  Net            -3047 ml   Filed Weights   03/25/16  0739 03/27/16 0301 03/28/16 0500  Weight: (!) 182.2 kg (401 lb 9.6 oz) (!) 175 kg (385 lb 11.2 oz) (!) 175.1 kg (386 lb)    Examination:  General exam: Morbidly obese female lying on bed comfortable , More alert and awake today. Respiratory system: Bibasal decreased breath sound, + crackles. No wheezing Cardiovascular system: S1 & S2 heard, RRR.   Gastrointestinal system: Abdomen is  soft and nontender. Normal bowel sounds heard. Central nervous system: Alert awake and following commands. Extremities: Bilateral lower extremities chronic venous stasis changes with edema, unchanged  Skin: No other rashes, lesions or ulcers     Data Reviewed: I have personally reviewed following labs and imaging studies  CBC:  Recent Labs Lab 03/22/16 0602  03/24/16 0332 03/24/16 2046 03/25/16 0218 03/26/16 0507 03/27/16 0241  WBC 9.3  < > 5.8 5.6 5.1 5.1 5.7  NEUTROABS 7.8*  --   --   --   --   --  3.4  HGB 7.4*  < > 6.3* 8.0* 7.9* 8.4* 9.1*  HCT 23.8*  < > 20.4* 25.3* 24.7* 26.7* 28.4*  MCV 88.5  < > 87.6 85.5 84.3 86.4 85.0  PLT 317  < > 267 237 253 265 287  < > = values in this interval not displayed. Basic Metabolic Panel:  Recent Labs Lab 03/23/16 0018 03/24/16 0332 03/25/16 0934 03/26/16 0507 03/27/16 0241 03/28/16 0537  NA 138 137 138 138 140 141  K 4.6 4.2 3.9 4.1 4.2 3.8  CL 109 106 110 106 107 108  CO2 15* 16* 17* 17* 19* 20*  GLUCOSE 81 68 76 64* 70 74  BUN 57* 59* 59* 61* 61* 63*  CREATININE 5.31* 5.20* 5.06* 5.17* 5.29* 5.14*  CALCIUM 8.5* 8.1* 7.6* 8.0* 8.4* 8.5*  MG 1.9  --   --   --   --   --   PHOS  --   --  5.8* 5.7* 5.4* 5.1*   GFR: Estimated Creatinine Clearance: 24.5 mL/min (by C-G formula based on SCr of 5.14 mg/dL (H)). Liver Function Tests:  Recent Labs Lab 03/22/16 0602 03/25/16 0934 03/26/16 0507 03/27/16 0241 03/28/16 0537  AST 8*  --   --   --   --   ALT 7*  --   --   --   --   ALKPHOS 31*  --   --   --   --   BILITOT 0.6  --   --   --    --   PROT 7.1  --   --   --   --   ALBUMIN 2.7* 2.1* 2.1* 2.3* 2.1*   No results for input(s): LIPASE, AMYLASE in the last 168 hours. No results for input(s): AMMONIA in the last 168 hours. Coagulation Profile: No results for input(s): INR, PROTIME in the last 168 hours. Cardiac Enzymes: No results for input(s): CKTOTAL, CKMB, CKMBINDEX, TROPONINI in the last 168 hours. BNP (last 3 results) No results for input(s): PROBNP in the last 8760 hours. HbA1C: No results for input(s): HGBA1C in the last 72 hours. CBG:  Recent Labs Lab 03/27/16 1524 03/27/16 2134 03/27/16 2343  03/28/16 0834 03/28/16 1203  GLUCAP 66 60* 64* 67 89   Lipid Profile: No results for input(s): CHOL, HDL, LDLCALC, TRIG, CHOLHDL, LDLDIRECT in the last 72 hours. Thyroid Function Tests: No results for input(s): TSH, T4TOTAL, FREET4, T3FREE, THYROIDAB in the last 72 hours. Anemia Panel: No results for input(s): VITAMINB12, FOLATE, FERRITIN, TIBC, IRON, RETICCTPCT in the last 72 hours. Sepsis Labs:  Recent Labs Lab 03/22/16 0553 03/23/16 0018 03/24/16 0332 03/26/16 0507  PROCALCITON  --  2.45 3.01 1.78  LATICACIDVEN 0.61  --   --   --     Recent Results (from the past 240 hour(s))  Blood culture (routine x 2)     Status: None   Collection Time: 03/22/16  5:30 AM  Result Value Ref Range Status   Specimen Description BLOOD LEFT HAND  Final   Special Requests BOTTLES DRAWN AEROBIC ONLY  Final   Culture NO GROWTH 5 DAYS  Final   Report Status 03/27/2016 FINAL  Final  Blood culture (routine x 2)     Status: None   Collection Time: 03/22/16  6:18 AM  Result Value Ref Range Status   Specimen Description BLOOD LEFT HAND  Final   Special Requests IN PEDIATRIC BOTTLE  Final   Culture NO GROWTH 5 DAYS  Final   Report Status 03/27/2016 FINAL  Final  MRSA PCR Screening     Status: None   Collection Time: 03/22/16  9:51 PM  Result Value Ref Range Status   MRSA by PCR NEGATIVE NEGATIVE Final     Comment:        The GeneXpert MRSA Assay (FDA approved for NASAL specimens only), is one component of a comprehensive MRSA colonization surveillance program. It is not intended to diagnose MRSA infection nor to guide or monitor treatment for MRSA infections.          Radiology Studies: No results found.      Scheduled Meds: . sodium chloride   Intravenous Once  . amLODipine  10 mg Oral Daily  . amoxicillin-clavulanate  1 tablet Oral BID  . arformoterol  15 mcg Nebulization BID  . budesonide (PULMICORT) nebulizer solution  0.25 mg Nebulization BID  . carvedilol  25 mg Oral BID WC  . chlorhexidine  15 mL Mouth Rinse BID  . ferrous sulfate  325 mg Oral TID WC  . furosemide  160 mg Intravenous Q6H  . heparin  5,000 Units Subcutaneous Q8H  . hydrALAZINE  50 mg Oral TID  . ipratropium-albuterol  3 mL Nebulization TID  . isosorbide mononitrate  30 mg Oral Daily  . mouth rinse  15 mL Mouth Rinse q12n4p  . metolazone  10 mg Oral Daily  . potassium chloride  40 mEq Oral Daily  . sodium bicarbonate  650 mg Oral TID  . sodium chloride flush  3 mL Intravenous Q12H   Continuous Infusions:    LOS: 6 days    Farra Nikolic Jaynie Collins, MD [facility] Pager [phone]  If 7PM-7AM, please contact night-coverage [email or web address] Password [facility] 03/28/2016, 2:39 PM

## 2016-03-28 NOTE — Progress Notes (Signed)
RN Thayer Ohmhris call this RT to inform me that [name] had taken the bipap off. RN placed Pt on 5lt [name]. SATS 92%.

## 2016-03-28 NOTE — Consult Note (Signed)
Consultation Note Date: 03/28/2016   Patient Name: [name]  DOB: 28-Feb-1975  MRN: [id number]  Age / Sex: 41 y.o., female  PCP: Alvester Chou, NP Referring Physician: Rosita Fire, MD  Reason for Consultation: Establishing goals of care  HPI/Patient Profile: 41 y.o. 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   Clinical Assessment and Goals of Care: I met today with [name] along with her husband at bedside. We discussed her serious health issues including renal failure and concern for needing dialysis. Her husband seems very supportive of assisting her with lifestyle changes. [name] is more flat and does not seem as optimistic and motivated as her husband. [name] would like to live longer as she has 3 young children and her husband.   They tell me that they are happy to be returning home soon. She has been bed bound ~1 year now. We discussed how difficult this will be but healthy eating and gradually increasing exercise will be needed. We discussed that if she were to need dialysis with being bed bound that this will be very difficult to be done so it is important for her to be motivated to become more healthy.   I offered support and education regarding diet, exercise (even small bed exercises during commercials while watching television), fluid restrictions. They will need very specific and simple instructions and education for assistance. Unfortunately she has Medicaid and will not be able to receive assistance at home with PT. Emotional support provided. They are hopeful for improvement.   Primary Decision Maker PATIENT and her  husband is next of kin    SUMMARY OF RECOMMENDATIONS   - They remain hopeful for improvement  Code Status/Advance Care Planning:  Full code   Symptom Management:   Poor appetite: Continue to encourage appetite and Nepro.  Physical deconditioning: Encourage appetite and exercise/increased activity.   Palliative Prophylaxis:   Delirium Protocol, Frequent Pain Assessment and Turn Reposition  Additional Recommendations (Limitations, Scope, Preferences):  Full Scope Treatment  Psycho-social/Spiritual:   Desire for further Chaplaincy support:no  Additional Recommendations: Caregiving  Support/Resources  Prognosis:   Unable to determine  Discharge Planning: Home with Home Health      Primary Diagnoses: Present on Admission: . Acute on chronic diastolic CHF (congestive heart failure) ([place]) . Chronic kidney disease (CKD), stage IV (severe) ([place]) . Arm swelling . Anemia of chronic disease . Acute respiratory failure ([place]) . Essential hypertension . Influenza A . Acute respiratory failure with hypoxia ([place]) . Symptomatic anemia . Morbid obesity ([place]) . HCAP (healthcare-associated pneumonia) . Flu-like symptoms . Hypertensive emergency . Metabolic acidosis . Acute kidney injury superimposed on CKD ([place])   I have reviewed the medical record, interviewed the patient and family, and examined the patient. The following aspects are pertinent.  Past Medical History:  Diagnosis Date  . Asthma   . CHF (congestive heart failure) (HCC)    diastolic  . CVA (cerebral infarction) 1990's   "writing is not the same since" R leg weakness   . GERD (gastroesophageal reflux disease)   . Hypertension   . Morbid obesity ([place])   . Stroke ([place])   . Type II diabetes mellitus ([place])    Social History   Social History  . Marital status: Married    Spouse name: N/A  . Number of children: N/A  . Years of education: N/A   Social History Main Topics  . Smoking status: Never  Smoker  . Smokeless tobacco: Never Used  . Alcohol use No  . Drug use: No  . Sexual activity: Not Asked   Other Topics Concern  . None   Social History Narrative  . None   Family History  Problem Relation Age of Onset  . Diabetes Mother   . Kidney disease Mother    Scheduled Meds: . sodium chloride   Intravenous Once  . amLODipine  10 mg Oral Daily  . amoxicillin-clavulanate  1 tablet Oral BID  . arformoterol  15 mcg Nebulization BID  . budesonide (PULMICORT) nebulizer solution  0.25 mg Nebulization BID  . carvedilol  25 mg Oral BID WC  . chlorhexidine  15 mL Mouth Rinse BID  . ferrous sulfate  325 mg Oral TID WC  . furosemide  160 mg Intravenous Q6H  . heparin  5,000 Units Subcutaneous Q8H  . hydrALAZINE  100 mg Oral TID  . ipratropium-albuterol  3 mL Nebulization TID  . isosorbide mononitrate  30 mg Oral Daily  . mouth rinse  15 mL Mouth Rinse q12n4p  . metolazone  10 mg Oral Daily  . potassium chloride  40 mEq Oral Daily  . sodium bicarbonate  650 mg Oral TID  . sodium chloride flush  3 mL Intravenous Q12H   Continuous Infusions: PRN Meds:.acetaminophen **OR** acetaminophen, calcium carbonate, camphor-menthol **AND** hydrOXYzine, docusate sodium, feeding supplement (NEPRO CARB STEADY), hydrALAZINE, ipratropium-albuterol, ondansetron **OR** ondansetron (ZOFRAN) IV, sorbitol, zolpidem Allergies  Allergen Reactions  . Azithromycin Other (See Comments)    Nose bleeding event   Review of Systems  Constitutional: Positive for activity change and appetite change.  Neurological: Positive for weakness.    Physical Exam  Constitutional: She is oriented to person, place, and time. She appears well-developed.  HENT:  Head: Normocephalic and atraumatic.  Cardiovascular: Normal rate and regular rhythm.   Pulmonary/Chest: Effort normal. No accessory muscle usage. No tachypnea. No respiratory distress.  Abdominal: Soft. Normal appearance.  Neurological: She is alert and  oriented to person, place, and time.  Psychiatric:  Flat affect  Nursing note and vitals reviewed.   Vital Signs: BP (!) 167/89   Pulse 94   Temp 99 F (37.2 C) (Oral)   Resp (!) 24   Ht '5\' 6"'$  (1.676 m)   Wt (!) 175.1 kg (386 lb)   LMP  (LMP Unknown)   SpO2 93%   BMI 62.30 kg/m  Pain Assessment: No/denies pain   Pain Score: 0-No pain   SpO2: SpO2: 93 % O2 Device:SpO2: 93 % O2 Flow Rate: .O2 Flow Rate (L/min): 5 L/min  IO: Intake/output summary:  Intake/Output Summary (Last 24 hours) at 03/28/16 1623 Last data filed at 03/28/16 1207  Gross per 24 hour  Intake              353 ml  Output  3200 ml  Net            -2847 ml    LBM: Last BM Date: 03/27/16 Baseline Weight: Weight: (!) 168.3 kg (371 lb) Most recent weight: Weight: (!) 175.1 kg (386 lb)     Palliative Assessment/Data:   Flowsheet Rows   Flowsheet Row Most Recent Value  Intake Tab  Referral Department  Hospitalist  Unit at Time of Referral  Intermediate Care Unit  Palliative Care Primary Diagnosis  Nephrology  Date Notified  03/27/16  Palliative Care Type  New Palliative care  Reason for referral  Clarify Goals of Care  Date of Admission  03/22/16  # of days IP prior to Palliative referral  5  Clinical Assessment  Psychosocial & Spiritual Assessment  Palliative Care Outcomes      Time In: 7998 Time Out: 1630 Time Total: 62mn Greater than 50%  of this time was spent counseling and coordinating care related to the above assessment and plan.  Signed by: AVinie Sill NP Palliative Medicine Team Pager # [phone](M-F 8a-5p) Team Phone # [phone](Nights/Weekends)

## 2016-03-28 NOTE — Care Management Note (Signed)
Case Management Note  Patient Details  Name: [name] MRN: [id number] Date of Birth: 12/18/1975  Subjective/Objective:    Pt lives with husband and 3 children.  Discussed SNF for rehab and both pt and husband state they want pt to return home - husband plans to change pt's diet to facilitate weight loss and prevent further loss of renal function.                          Expected Discharge Plan:  Home/Self Care  Discharge planning Services  CM Consult  Status of Service:  In process, will continue to follow  Magdalene RiverMayo, Ustin Cruickshank T, RN 03/28/2016, 4:25 PM

## 2016-03-29 DIAGNOSIS — N189 Chronic kidney disease, unspecified: Secondary | ICD-10-CM

## 2016-03-29 DIAGNOSIS — J9601 Acute respiratory failure with hypoxia: Secondary | ICD-10-CM

## 2016-03-29 DIAGNOSIS — I10 Essential (primary) hypertension: Secondary | ICD-10-CM

## 2016-03-29 DIAGNOSIS — N179 Acute kidney failure, unspecified: Secondary | ICD-10-CM

## 2016-03-29 LAB — RENAL FUNCTION PANEL
Albumin: 2 g/dL — ABNORMAL LOW (ref 3.5–5.0)
Anion gap: 13 (ref 5–15)
BUN: 59 mg/dL — AB (ref 6–20)
CO2: 22 mmol/L (ref 22–32)
CREATININE: 5.19 mg/dL — AB (ref 0.44–1.00)
Calcium: 8.5 mg/dL — ABNORMAL LOW (ref 8.9–10.3)
Chloride: 106 mmol/L (ref 101–111)
GFR calc Af Amer: 11 mL/min — ABNORMAL LOW (ref >60)
GFR, EST NON AFRICAN AMERICAN: 10 mL/min — AB (ref >60)
Glucose, Bld: 64 mg/dL — ABNORMAL LOW (ref 65–99)
PHOSPHORUS: 5 mg/dL — AB (ref 2.5–4.6)
POTASSIUM: 3.9 mmol/L (ref 3.5–5.1)
Sodium: 141 mmol/L (ref 135–145)

## 2016-03-29 LAB — GLUCOSE, CAPILLARY
GLUCOSE-CAPILLARY: 90 mg/dL (ref 65–99)
Glucose-Capillary: 59 mg/dL — ABNORMAL LOW (ref 65–99)
Glucose-Capillary: 91 mg/dL (ref 65–99)
Glucose-Capillary: 96 mg/dL (ref 65–99)

## 2016-03-29 LAB — MAGNESIUM: MAGNESIUM: 1.8 mg/dL (ref 1.7–2.4)

## 2016-03-29 MED ORDER — FERROUS SULFATE 325 (65 FE) MG PO TABS
325.0000 mg | ORAL_TABLET | Freq: Three times a day (TID) | ORAL | Status: DC
  Filled 2016-03-29 (×2): qty 1

## 2016-03-29 MED ORDER — FERROUS SULFATE 325 (65 FE) MG PO TABS: 325.0000 mg | ORAL_TABLET | Freq: Three times a day (TID) | ORAL | Status: DC

## 2016-03-29 MED ORDER — FUROSEMIDE 80 MG PO TABS
160.0000 mg | ORAL_TABLET | Freq: Four times a day (QID) | ORAL | Status: DC
  Administered 2016-03-29 – 2016-03-30 (×5): 160 mg via ORAL
  Filled 2016-03-29 (×6): qty 2

## 2016-03-29 NOTE — Progress Notes (Signed)
PROGRESS NOTE    Linda DykesDavenia McKinnon [name]  ZOX:[id number] DOB: 03/05/1975 DOA: 03/22/2016 PCP: Marletta LorBarr, Julie, NP   Brief Narrative: 41 y.o.femalewith a past medical history significant for HFpEF, CKD IV baseline Cr 4.5 not on HD, hx of CVA with residual def, IDDM, and HTN, morbid obesitywho presented with respiratory distress.  Assessment & Plan:  # Acute on chronic diastolic heart failure with anasarca: - Echocardiogram with EF of 55-60%, grade 2 diastolic dysfunction -Patient responded very well with IV Lasix and metolazone. She is negative by about 12.7 L. Chest oral Lasix by nephrologist today. Continue to monitor urine output and renal function. If remains stable, may be able to discharge home soon. Patient wants to go home with her husband and families on discharge.  -Continue current cardiac medications including Coreg, hydralazine and Imdur and diuretics.  #Acute on chronic kidney disease stage IV: Likely hemodynamically mediated in the setting of congestive heart failure. -Serum creatinine level is stable. On diuretics oral. Monitor BMP. Needs to follow up with nephrologist on discharge.   #Influenza A positive: Continue bronchodilators, Tamiflu. Continue supportive care.  #probable HCAP: On Augmentin. Continue nebulizing treatment.  #Acute hypoxic respiratory failure: On room air this morning.  #Hypertension: Patient had hypertensive emergency on admission requiring nitroglycerin drip.  -Blood pressure is better controlled now. Continue current antihypertensive medications. Continue to monitor.  #Anemia of chronic kidney disease: Iron stores acceptable. Received a red blood cell transfusion during this hospitalization. Monitor CBC.  #Physical deconditioning, morbid obesity, hypoglycemia due to decreased oral intake: Palliative consult requested. PT OT evaluation recommended SNF. Patient was to go home with her families on discharge. -Encourage oral intake. Discontinue dextrose  IV. It was restarted because of hypoglycemia.  Principal Problem:   Acute respiratory failure with hypoxia (HCC) Active Problems:   Morbid obesity (HCC)   Chronic kidney disease (CKD), stage IV (severe) (HCC)   Essential hypertension   Acute respiratory failure (HCC)   History of CVA with residual deficit   Anemia of chronic disease   Lymphedema   Uncontrolled type 2 diabetes mellitus with complication, with long-term current use of insulin (HCC)   Symptomatic anemia   Arm swelling   Acute on chronic diastolic CHF (congestive heart failure) (HCC)   Influenza A   HCAP (healthcare-associated pneumonia)   Flu-like symptoms   Hypertensive emergency   Metabolic acidosis   Acute kidney injury superimposed on CKD (HCC)   Acute pulmonary edema (HCC)   Physical deconditioning   Goals of care, counseling/discussion   Palliative care encounter  DVT prophylaxis: Heparin subcutaneous Code Status: Full code Family Communication: I discussed with the patient's husband at bedside. Disposition Plan: Likely discharge to home versus nursing home in 1-2 days.  Consultants:   Nephrologist  Procedures:  2-D echo 03/22/2016  2 units packed red blood cells 03/24/2016 Antimicrobials:  IV cefepime 03/22/2016>>>>03/26/2016  IV vancomycin 1 dose.>>> 03/24/2016  Tamiflu 03/23/2016  Augmentin 03/26/2016 Subjective: Patient was seen and examined at bedside. No new event. Patient's mental status improved. Denied chest pain, shortness of breath, nausea or vomiting. Husband at bedside. Objective: Vitals:   03/29/16 0829 03/29/16 1011 03/29/16 1021 03/29/16 1239  BP:  (!) 158/80 (!) 158/80 (!) 144/75  Pulse:    88  Resp:    14  Temp:    97.9 F (36.6 C)  TempSrc:    Oral  SpO2: 95%   100%  Weight:      Height:        Intake/Output Summary (  Last 24 hours) at 03/29/16 1446 Last data filed at 03/29/16 1240  Gross per 24 hour  Intake              267 ml  Output             2300 ml  Net             -2033 ml   Filed Weights   03/27/16 0301 03/28/16 0500 03/29/16 0359  Weight: (!) 175 kg (385 lb 11.2 oz) (!) 175.1 kg (386 lb) (!) 167 kg (368 lb 3.2 oz)    Examination:  General exam: Morbidly obese female lying on bed comfortable , alert awake and comfortable sitting on chair. Respiratory system: Clear bilateral. No wheezing Cardiovascular system: S1 & S2 heard, RRR.   Gastrointestinal system: Abdomen is  soft and nontender. Normal bowel sounds heard. Central nervous system: Alert awake and following commands. Extremities: Bilateral lower extremities chronic venous stasis changes with edema, no significant change Skin: No other rashes, lesions or ulcers     Data Reviewed: I have personally reviewed following labs and imaging studies  CBC:  Recent Labs Lab 03/24/16 0332 03/24/16 2046 03/25/16 0218 03/26/16 0507 03/27/16 0241  WBC 5.8 5.6 5.1 5.1 5.7  NEUTROABS  --   --   --   --  3.4  HGB 6.3* 8.0* 7.9* 8.4* 9.1*  HCT 20.4* 25.3* 24.7* 26.7* 28.4*  MCV 87.6 85.5 84.3 86.4 85.0  PLT 267 237 253 265 287   Basic Metabolic Panel:  Recent Labs Lab 03/23/16 0018  03/25/16 0934 03/26/16 0507 03/27/16 0241 03/28/16 0537 03/29/16 0330  NA 138  < > 138 138 140 141 141  K 4.6  < > 3.9 4.1 4.2 3.8 3.9  CL 109  < > 110 106 107 108 106  CO2 15*  < > 17* 17* 19* 20* 22  GLUCOSE 81  < > 76 64* 70 74 64*  BUN 57*  < > 59* 61* 61* 63* 59*  CREATININE 5.31*  < > 5.06* 5.17* 5.29* 5.14* 5.19*  CALCIUM 8.5*  < > 7.6* 8.0* 8.4* 8.5* 8.5*  MG 1.9  --   --   --   --   --  1.8  PHOS  --   --  5.8* 5.7* 5.4* 5.1* 5.0*  < > = values in this interval not displayed. GFR: Estimated Creatinine Clearance: 23.5 mL/min (by C-G formula based on SCr of 5.19 mg/dL (H)). Liver Function Tests:  Recent Labs Lab 03/25/16 0934 03/26/16 0507 03/27/16 0241 03/28/16 0537 03/29/16 0330  ALBUMIN 2.1* 2.1* 2.3* 2.1* 2.0*   No results for input(s): LIPASE, AMYLASE in the last 168  hours. No results for input(s): AMMONIA in the last 168 hours. Coagulation Profile: No results for input(s): INR, PROTIME in the last 168 hours. Cardiac Enzymes: No results for input(s): CKTOTAL, CKMB, CKMBINDEX, TROPONINI in the last 168 hours. BNP (last 3 results) No results for input(s): PROBNP in the last 8760 hours. HbA1C: No results for input(s): HGBA1C in the last 72 hours. CBG:  Recent Labs Lab 03/28/16 1645 03/28/16 1936 03/28/16 2207 03/29/16 0732 03/29/16 1237  GLUCAP 71 74 75 59* 91   Lipid Profile: No results for input(s): CHOL, HDL, LDLCALC, TRIG, CHOLHDL, LDLDIRECT in the last 72 hours. Thyroid Function Tests: No results for input(s): TSH, T4TOTAL, FREET4, T3FREE, THYROIDAB in the last 72 hours. Anemia Panel: No results for input(s): VITAMINB12, FOLATE, FERRITIN, TIBC, IRON, RETICCTPCT in the last 72  hours. Sepsis Labs:  Recent Labs Lab 03/23/16 0018 03/24/16 0332 03/26/16 0507  PROCALCITON 2.45 3.01 1.78    Recent Results (from the past 240 hour(s))  Blood culture (routine x 2)     Status: None   Collection Time: 03/22/16  5:30 AM  Result Value Ref Range Status   Specimen Description BLOOD LEFT HAND  Final   Special Requests BOTTLES DRAWN AEROBIC ONLY  Final   Culture NO GROWTH 5 DAYS  Final   Report Status 03/27/2016 FINAL  Final  Blood culture (routine x 2)     Status: None   Collection Time: 03/22/16  6:18 AM  Result Value Ref Range Status   Specimen Description BLOOD LEFT HAND  Final   Special Requests IN PEDIATRIC BOTTLE  Final   Culture NO GROWTH 5 DAYS  Final   Report Status 03/27/2016 FINAL  Final  MRSA PCR Screening     Status: None   Collection Time: 03/22/16  9:51 PM  Result Value Ref Range Status   MRSA by PCR NEGATIVE NEGATIVE Final    Comment:        The GeneXpert MRSA Assay (FDA approved for NASAL specimens only), is one component of a comprehensive MRSA colonization surveillance program. It is not intended to  diagnose MRSA infection nor to guide or monitor treatment for MRSA infections.          Radiology Studies: No results found.      Scheduled Meds: . sodium chloride   Intravenous Once  . amLODipine  10 mg Oral Daily  . amoxicillin-clavulanate  1 tablet Oral BID  . arformoterol  15 mcg Nebulization BID  . budesonide (PULMICORT) nebulizer solution  0.25 mg Nebulization BID  . carvedilol  25 mg Oral BID WC  . chlorhexidine  15 mL Mouth Rinse BID  . ferrous sulfate  325 mg Oral TID WC  . furosemide  160 mg Oral Q6H  . heparin  5,000 Units Subcutaneous Q8H  . hydrALAZINE  100 mg Oral TID  . ipratropium-albuterol  3 mL Nebulization TID  . isosorbide mononitrate  30 mg Oral Daily  . mouth rinse  15 mL Mouth Rinse q12n4p  . metolazone  10 mg Oral Daily  . potassium chloride  40 mEq Oral Daily  . sodium bicarbonate  650 mg Oral TID  . sodium chloride flush  3 mL Intravenous Q12H   Continuous Infusions:    LOS: 7 days    Kimball Appleby Jaynie Collins, MD [facility] Pager [phone]  If 7PM-7AM, please contact night-coverage [email or web address] Password [email or web address] 03/29/2016, 2:46 PM

## 2016-03-29 NOTE — Progress Notes (Signed)
Admit: 03/22/2016 LOS: 7  34F supermorbid obesity, profound hypervolemia, CKD5 w/o uremia  Subjective:  Trying to cut back on sodas Cont adequate diuresis w/o change in renal labs    02/15 0701 - 02/16 0700 In: 274 [I.V.:26; IV Piggyback:248] Out: 3000 [Urine:3000]  Filed Weights   03/27/16 0301 03/28/16 0500 03/29/16 0359  Weight: (!) 175 kg (385 lb 11.2 oz) (!) 175.1 kg (386 lb) (!) 167 kg (368 lb 3.2 oz)    Scheduled Meds: . sodium chloride   Intravenous Once  . amLODipine  10 mg Oral Daily  . amoxicillin-clavulanate  1 tablet Oral BID  . arformoterol  15 mcg Nebulization BID  . budesonide (PULMICORT) nebulizer solution  0.25 mg Nebulization BID  . carvedilol  25 mg Oral BID WC  . chlorhexidine  15 mL Mouth Rinse BID  . ferrous sulfate  325 mg Oral TID WC  . furosemide  160 mg Intravenous Q6H  . heparin  5,000 Units Subcutaneous Q8H  . hydrALAZINE  100 mg Oral TID  . ipratropium-albuterol  3 mL Nebulization TID  . isosorbide mononitrate  30 mg Oral Daily  . mouth rinse  15 mL Mouth Rinse q12n4p  . metolazone  10 mg Oral Daily  . potassium chloride  40 mEq Oral Daily  . sodium bicarbonate  650 mg Oral TID  . sodium chloride flush  3 mL Intravenous Q12H   Continuous Infusions: PRN Meds:.acetaminophen **OR** acetaminophen, calcium carbonate, camphor-menthol **AND** hydrOXYzine, docusate sodium, feeding supplement (NEPRO CARB STEADY), hydrALAZINE, ipratropium-albuterol, ondansetron **OR** ondansetron (ZOFRAN) IV, sorbitol, zolpidem  Current Labs: reviewed    Physical Exam:  Blood pressure (!) 169/84, pulse 95, temperature 98.9 F (37.2 C), temperature source Oral, resp. rate 10, height 5\' 6"  (1.676 m), weight (!) 167 kg (368 lb 3.2 oz), SpO2 95 %. Obese, nad, awake RRR Course bs b/l 2+ edema  A 1. CKD5  2. Hypervolemia / volume overload 3. ANemia, improving 4. dCHF exacerbation inproving 5. FLu + s/p tamiflu 6. HTN  P 1. Change to PO lasix 160 PO q6h, cont  metolazone 2. If can cont to have good UOP ready for DC 3. WIll need to aggressive therapy and weigh loss for best outcomes, discussed again today 4. WIll need to arrange f/u with me in the office   Sabra Heckyan Sanford MD 03/29/2016, 9:45 AM   Recent Labs Lab 03/27/16 0241 03/28/16 0537 03/29/16 0330  NA 140 141 141  K 4.2 3.8 3.9  CL 107 108 106  CO2 19* 20* 22  GLUCOSE 70 74 64*  BUN 61* 63* 59*  CREATININE 5.29* 5.14* 5.19*  CALCIUM 8.4* 8.5* 8.5*  PHOS 5.4* 5.1* 5.0*    Recent Labs Lab 03/25/16 0218 03/26/16 0507 03/27/16 0241  WBC 5.1 5.1 5.7  NEUTROABS  --   --  3.4  HGB 7.9* 8.4* 9.1*  HCT 24.7* 26.7* 28.4*  MCV 84.3 86.4 85.0  PLT 253 265 287

## 2016-03-29 NOTE — Plan of Care (Signed)
Problem: Health Behavior/Discharge Planning: Goal: Ability to manage health-related needs will improve Outcome: Not Progressing Pt continues to refuse to participate in care.

## 2016-03-29 NOTE — Progress Notes (Signed)
Called Pharmacy after requesting am does of Ferrous Sulfate for patient not available for now dose.  Toni AmendCourtney stated she would re-enter the medication into the pyxis.

## 2016-03-29 NOTE — Progress Notes (Addendum)
Initial Nutrition Assessment  DOCUMENTATION CODES:   Morbid obesity  INTERVENTION:   -Continue Nepro Shake po TID, each supplement provides 425 kcal and 19 grams protein -Educated pt and family on low sodium diet and lifestyle interventions to attempt to prevent progression of kidney failure and facilitate weight loss. Provided "Heart Healthy Nutrition Therapy- Reduced Sodium", "Label Reading Tips", and "Sodium Free Flavoring Tips" from [facility]'s Nutrition care Manual  NUTRITION DIAGNOSIS:   Increased nutrient needs related to wound healing as evidenced by estimated needs.  GOAL:   Patient will meet greater than or equal to 90% of their needs  MONITOR:   PO intake, Supplement acceptance, Labs, Weight trends, Skin, I & O's  REASON FOR ASSESSMENT:   Low [place]    ASSESSMENT:   Linda DykesDavenia McKinnon [name] is a 41 y.o. female with a past medical history significant for HFpEF, CKD V baseline Cr 4.5 not on HD, hx of CVA with residual def, IDDM, and HTN who presents with respiratory distress for 1 day.  Pt admitted with acute respiratory failure with hypoxia.   Spoke with pt at bedside, who reports her appetite has improved today. She shares she consumed about half of her breakfast, which is more than she has ate in a few weeks. Meal completion 0-45%. Pt shares that she has not been feeling well for the past 2-3 weeks due to "the flu; first my kids got it, then my husband got it and then I think they gave it to me".   Pt was very forthcoming about dietary indiscretion PTA. She admits that she was consuming a lot of fried foods and adding salt to season her food. Per pt sister, pt has access to sodium free seasonings, but chooses not to use them. Pt was able to verbalize severity of medical situation to this RD and expressed intention to start incorporating lifestyle changes, specifically limiting fried foods and soft drinks, to assist in weight loss and prevent initiation of HD. Pt shared with  this RD "I need to get better because I don't want to go to a facility [place]".   Reviewed wt hx, which fluctuates likely due to fluid changes.   Pt requested further education on diet. RD provided encouragement and focused on basic diet changes; focused on decreasing sodium in diet. RD discussed importance of limiting sodium to related to heart, kidney disease, and fluid status. Pt sister and husband in room were very disengaged in conversation, despite RD's attempt to try and engage caregivers in conversation (pt husband was on his phone during entire RD visit). Husband requested that this RD provide pt with handouts to review ("she needs to learn what to eat"). Discussed concerns with RN. Suspect family dynamics may impede pt's compliance.   Nutrition-Focused physical exam completed. Findings are no fat depletion, no muscle depletion, and severe edema.   Labs reviewed: CBGS: 59-01.   Diet Order:  Diet renal with fluid restriction Fluid restriction: 1200 mL Fluid; Room service appropriate? Yes; Fluid consistency: Thin  Skin:  Wound (see comment) (unstageable pressure injury rt thigh)  Last BM:  03/27/16  Height:   Ht Readings from Last 1 Encounters:  03/22/16 5\' 6"  (1.676 m)    Weight:   Wt Readings from Last 1 Encounters:  03/29/16 (!) 368 lb 3.2 oz (167 kg)    Ideal Body Weight:  59.1 kg  BMI:  Body mass index is 59.43 kg/m.  Estimated Nutritional Needs:   Kcal:  1850-2050  Protein:  130-150 grams  Fluid:  per MD  EDUCATION NEEDS:   Education needs addressed  Graesyn Schreifels A. Mayford Knife, RD, LDN, CDE Pager: [phone] After hours Pager: [phone]

## 2016-03-30 DIAGNOSIS — R6889 Other general symptoms and signs: Secondary | ICD-10-CM

## 2016-03-30 LAB — RENAL FUNCTION PANEL
Albumin: 1.9 g/dL — ABNORMAL LOW (ref 3.5–5.0)
Anion gap: 12 (ref 5–15)
BUN: 59 mg/dL — AB (ref 6–20)
CHLORIDE: 107 mmol/L (ref 101–111)
CO2: 23 mmol/L (ref 22–32)
Calcium: 8.3 mg/dL — ABNORMAL LOW (ref 8.9–10.3)
Creatinine, Ser: 5.17 mg/dL — ABNORMAL HIGH (ref 0.44–1.00)
GFR calc Af Amer: 11 mL/min — ABNORMAL LOW (ref >60)
GFR calc non Af Amer: 10 mL/min — ABNORMAL LOW (ref >60)
Glucose, Bld: 78 mg/dL (ref 65–99)
POTASSIUM: 3.9 mmol/L (ref 3.5–5.1)
Phosphorus: 5 mg/dL — ABNORMAL HIGH (ref 2.5–4.6)
Sodium: 142 mmol/L (ref 135–145)

## 2016-03-30 LAB — GLUCOSE, CAPILLARY
GLUCOSE-CAPILLARY: 71 mg/dL (ref 65–99)
Glucose-Capillary: 84 mg/dL (ref 65–99)

## 2016-03-30 MED ORDER — POTASSIUM CHLORIDE CRYS ER 20 MEQ PO TBCR: 40.0000 meq | EXTENDED_RELEASE_TABLET | Freq: Every day | ORAL | 0 refills | Status: DC

## 2016-03-30 MED ORDER — FUROSEMIDE 80 MG PO TABS: 160.0000 mg | ORAL_TABLET | Freq: Four times a day (QID) | ORAL | 0 refills | Status: DC

## 2016-03-30 MED ORDER — METOLAZONE 10 MG PO TABS: 10.0000 mg | ORAL_TABLET | Freq: Every day | ORAL | 0 refills | Status: DC

## 2016-03-30 MED ORDER — HYDRALAZINE HCL 50 MG PO TABS: 100.0000 mg | ORAL_TABLET | Freq: Three times a day (TID) | ORAL | 0 refills | Status: DC

## 2016-03-30 MED ORDER — CAMPHOR-MENTHOL 0.5-0.5 % EX LOTN: 1.0000 "application " | TOPICAL_LOTION | Freq: Three times a day (TID) | CUTANEOUS | 0 refills | Status: AC | PRN

## 2016-03-30 NOTE — Progress Notes (Signed)
Patient discharge home after vertebralis back understanding of when to call Dr with instructions of potential illness.  Prescriptions given to PTR to give to patient at home.  Patient called sister to make sure she would be at home when she got there.  Both saline locks discharged with sites clean dry and intact.

## 2016-03-30 NOTE — Progress Notes (Signed)
Admit: 03/22/2016 LOS: 8  22F supermorbid obesity, profound hypervolemia, CKD5 w/o uremia  Subjective:  NOw on PO Diuretics SCr stable, Weight around 167kg Good UOP Trying to cut back sugars/sweets  02/16 0701 - 02/17 0700 In: 309 [P.O.:240; I.V.:3; IV Piggyback:66] Out: 2675 [Urine:2675]  Filed Weights   03/27/16 0301 03/28/16 0500 03/29/16 0359  Weight: (!) 175 kg (385 lb 11.2 oz) (!) 175.1 kg (386 lb) (!) 167 kg (368 lb 3.2 oz)    Scheduled Meds: . sodium chloride   Intravenous Once  . amLODipine  10 mg Oral Daily  . amoxicillin-clavulanate  1 tablet Oral BID  . arformoterol  15 mcg Nebulization BID  . budesonide (PULMICORT) nebulizer solution  0.25 mg Nebulization BID  . carvedilol  25 mg Oral BID WC  . chlorhexidine  15 mL Mouth Rinse BID  . ferrous sulfate  325 mg Oral TID WC  . furosemide  160 mg Oral Q6H  . heparin  5,000 Units Subcutaneous Q8H  . hydrALAZINE  100 mg Oral TID  . ipratropium-albuterol  3 mL Nebulization TID  . isosorbide mononitrate  30 mg Oral Daily  . mouth rinse  15 mL Mouth Rinse q12n4p  . metolazone  10 mg Oral Daily  . potassium chloride  40 mEq Oral Daily  . sodium bicarbonate  650 mg Oral TID  . sodium chloride flush  3 mL Intravenous Q12H   Continuous Infusions: PRN Meds:.acetaminophen **OR** acetaminophen, calcium carbonate, camphor-menthol **AND** hydrOXYzine, docusate sodium, feeding supplement (NEPRO CARB STEADY), hydrALAZINE, ipratropium-albuterol, ondansetron **OR** ondansetron (ZOFRAN) IV, sorbitol, zolpidem  Current Labs: reviewed    Physical Exam:  Blood pressure (!) 162/81, pulse 93, temperature 98.3 F (36.8 C), temperature source Oral, resp. rate 19, height 5\' 6"  (1.676 m), weight (!) 167 kg (368 lb 3.2 oz), SpO2 100 %. Obese, nad, awake RRR Course bs b/l 2+ edema  A 1. CKD5  2. Hypervolemia / volume overload 3. ANemia, improving 4. dCHF exacerbation inproving 5. FLu + s/p tamiflu 6. HTN  P 1. Cont PO lasix 160  PO q6h, cont metolazone 2. OK with DC, will arrange close f/u with labs and appt to see me 3. WIll need to aggressive therapy and weigh loss for best outcomes, discussed again today  Sabra Heckyan Tyshauna Finkbiner MD 03/30/2016, 10:36 AM   Recent Labs Lab 03/28/16 0537 03/29/16 0330 03/30/16 0246  NA 141 141 142  K 3.8 3.9 3.9  CL 108 106 107  CO2 20* 22 23  GLUCOSE 74 64* 78  BUN 63* 59* 59*  CREATININE 5.14* 5.19* 5.17*  CALCIUM 8.5* 8.5* 8.3*  PHOS 5.1* 5.0* 5.0*    Recent Labs Lab 03/25/16 0218 03/26/16 0507 03/27/16 0241  WBC 5.1 5.1 5.7  NEUTROABS  --   --  3.4  HGB 7.9* 8.4* 9.1*  HCT 24.7* 26.7* 28.4*  MCV 84.3 86.4 85.0  PLT 253 265 287

## 2016-03-30 NOTE — Discharge Summary (Signed)
Physician Discharge Summary  [name] ZOX:[id number] DOB: 06-15-1975 DOA: 03/22/2016  PCP: Marletta Lor, NP  Admit date: 03/22/2016 Discharge date: 03/30/2016  Admitted From:Home Disposition:home with home care  Recommendations for Outpatient Follow-up:  1. Follow up with PCP in 1-2 weeks 2. Please obtain BMP/CBC in one week  Home Health:yes Discharge Condition:Stable CODE STATUS: Full code Diet recommendation: Carb modified heart healthy diet  Brief/Interim Summary:41 y.o.femalewith a past medical history significant for HFpEF, CKD IV baseline Cr 4.5 not on HD, hx of CVA with residual def, IDDM, and HTN, morbid obesitywho presented with respiratory distress.  # Acute on chronic diastolic heart failure with anasarca: - Echocardiogram with EF of 55-60%, grade 2 diastolic dysfunction -Patient responded very well with IV Lasix and metolazone. She is negative by about 15 L since admission. Currently on oral Lasix and metolazone. Patient has very good urine output and serum creatinine remains stable.  -Continue current cardiac medications including Coreg, hydralazine and Imdur and diuretics. -Education provided to the patient regarding low salt diet.  #Acute on chronic kidney disease stage IV vs chronic kidney disease status 5, unspecified: - Likely hemodynamically mediated in the setting of congestive heart failure. -Serum creatinine level is stable.  -Evaluated by a nephrologist recommended to discharge with oral diuretics with close outpatient follow-up. I advised patient to contact the clinic if she doesn't hear from the clinic regarding follow-ups and lab monitoring.   #Influenza A positive: Completed Tamiflu. Clinically improved.  #probable HCAP: Completed antibiotics course. Continue bronchodilators. Clinically improved.Marland Kitchen  #Acute hypoxic respiratory failure: On room air this morning. I advised patient to follow-up with her PCP and possibly with pulmonologist for  the evaluation of sleep apnea.  #Hypertension: Patient had hypertensive emergency on admission requiring nitroglycerin drip.  -Blood pressure is better controlled now. Continue current antihypertensive medications. Continue to monitor. Advised to check blood pressure daily at home.  #Anemia of chronic kidney disease: Iron stores acceptable. Received a red blood cell transfusion during this hospitalization. Monitor CBC.  #Physical deconditioning, morbid obesity, hypoglycemia due to decreased oral intake: Palliative consult evaluated the patient. PT OT evaluation recommended SNF. I discussed this with the patient and her husband in detail. Both of them want patient to go home with home care and outpatient follow-up. As per patient and her family's request I am starting patient to home with home care. Home care ordered. Discussed with the patient's nurse.  Discharge Diagnoses:  Principal Problem:   Acute respiratory failure with hypoxia (HCC) Active Problems:   Morbid obesity (HCC)   Chronic kidney disease (CKD), stage IV (severe) (HCC)   Essential hypertension   Acute respiratory failure (HCC)   History of CVA with residual deficit   Anemia of chronic disease   Lymphedema   Uncontrolled type 2 diabetes mellitus with complication, with long-term current use of insulin (HCC)   Symptomatic anemia   Arm swelling   Acute on chronic diastolic CHF (congestive heart failure) (HCC)   Influenza A   HCAP (healthcare-associated pneumonia)   Flu-like symptoms   Hypertensive emergency   Metabolic acidosis   Acute kidney injury superimposed on CKD (HCC)   Acute pulmonary edema (HCC)   Physical deconditioning   Goals of care, counseling/discussion   Palliative care encounter    Discharge Instructions  Discharge Instructions    (HEART FAILURE PATIENTS) Call MD:  Anytime you have any of the following symptoms: 1) 3 pound weight gain in 24 hours or 5 pounds in 1 week 2) shortness of  breath,  with or without a dry hacking cough 3) swelling in the hands, feet or stomach 4) if you have to sleep on extra pillows at night in order to breathe.    Complete by:  As directed    Call MD for:  difficulty breathing, headache or visual disturbances    Complete by:  As directed    Call MD for:  extreme fatigue    Complete by:  As directed    Call MD for:  hives    Complete by:  As directed    Call MD for:  persistant dizziness or light-headedness    Complete by:  As directed    Call MD for:  persistant nausea and vomiting    Complete by:  As directed    Call MD for:  severe uncontrolled pain    Complete by:  As directed    Call MD for:  temperature >100.4    Complete by:  As directed    Diet - low sodium heart healthy    Complete by:  As directed    Diet Carb Modified    Complete by:  As directed    Discharge instructions    Complete by:  As directed    Please take low-salt diet. Please follow up with your PCP and nephrologist. Monitor blood sugar level at home. -You may need to see pulmonologist for the  evaluation of sleep apnea. Discussed with your PCP.   Increase activity slowly    Complete by:  As directed      Allergies as of 03/30/2016      Reactions   Azithromycin Other (See Comments)   Nose bleeding event      Medication List    STOP taking these medications   insulin glargine 100 UNIT/ML injection Commonly known as:  LANTUS   torsemide 20 MG tablet Commonly known as:  DEMADEX     TAKE these medications   albuterol 108 (90 Base) MCG/ACT inhaler Commonly known as:  PROAIR HFA Inhale 2 puffs into the lungs every 6 (six) hours as needed for wheezing or shortness of breath.   albuterol (2.5 MG/3ML) 0.083% nebulizer solution Commonly known as:  PROVENTIL Take 3 mLs (2.5 mg total) by nebulization every 6 (six) hours as needed for wheezing or shortness of breath.   amLODipine 10 MG tablet Commonly known as:  NORVASC Take 1 tablet (10 mg total) by mouth daily.    atorvastatin 20 MG tablet Commonly known as:  LIPITOR Take 20 mg by mouth daily.   camphor-menthol lotion Commonly known as:  SARNA Apply 1 application topically every 8 (eight) hours as needed for itching.   carvedilol 25 MG tablet Commonly known as:  COREG Take 1 tablet (25 mg total) by mouth 2 (two) times daily with a meal.   collagenase ointment Commonly known as:  SANTYL Apply 1 application topically daily.   ferrous sulfate 325 (65 FE) MG tablet Take 1 tablet (325 mg total) by mouth 3 (three) times daily with meals.   furosemide 80 MG tablet Commonly known as:  LASIX Take 2 tablets (160 mg total) by mouth every 6 (six) hours.   hydrALAZINE 50 MG tablet Commonly known as:  APRESOLINE Take 2 tablets (100 mg total) by mouth 3 (three) times daily. What changed:  how much to take   insulin aspart 100 UNIT/ML injection Commonly known as:  novoLOG Inject 0-20 Units into the skin 3 (three) times daily with meals. CBG < 70: implement  hypoglycemia protocol CBG 70 - 120: 0 units CBG 121 - 150: 3 units CBG 151 - 200: 4 units CBG 201 - 250: 7 units CBG 251 - 300: 11 units CBG 301 - 350: 15 units CBG 351 - 400: 20 units   isosorbide mononitrate 30 MG 24 hr tablet Commonly known as:  IMDUR Take 1 tablet (30 mg total) by mouth daily.   metolazone 10 MG tablet Commonly known as:  ZAROXOLYN Take 1 tablet (10 mg total) by mouth daily. Start taking on:  03/31/2016   potassium chloride SA 20 MEQ tablet Commonly known as:  K-DUR,KLOR-CON Take 2 tablets (40 mEq total) by mouth daily. Start taking on:  03/31/2016   vitamin A 8000 UNIT capsule Take 8,000 Units by mouth daily.      Follow-up Information    Marletta Lor, NP. Schedule an appointment as soon as possible for a visit in 1 week(s).   Specialty:  Nurse Practitioner Contact information: [facility] [address] [place] [address] [phone]        Arita Miss, MD. Schedule an  appointment as soon as possible for a visit in 2 week(s).   Specialty:  Nephrology Contact information: [address] [place] [address] [phone]          Allergies  Allergen Reactions  . Azithromycin Other (See Comments)    Nose bleeding event    Consultations: Nephrologist  Procedures/Studies:  2-D echo 03/22/2016  2 units packed red blood cells 03/24/2016 Antimicrobials:  IV cefepime 03/22/2016>>>>03/26/2016  IV vancomycin 1 dose.>>> 03/24/2016  Tamiflu 03/23/2016  Augmentin 03/26/2016 Subjective: Patient was seen and examined at bedside. Reported feeling better. She wants to go home today. Denied headache, dizziness, nausea, vomiting, chest pain or shortness of breath.  Discharge Exam: Vitals:   03/30/16 1132 03/30/16 1200  BP: (!) 154/95   Pulse:  95  Resp:  (!) 0  Temp:     Vitals:   03/30/16 1100 03/30/16 1126 03/30/16 1132 03/30/16 1200  BP:  (!) 154/95 (!) 154/95   Pulse: 95   95  Resp: 20   (!) 0  Temp:      TempSrc:      SpO2: 93%   93%  Weight:      Height:        General: Obese female lying in bed comfortable, not in distress.  Cardiovascular: RRR, S1/S2 +, no rubs, no gallops Respiratory: CTA bilaterally, no wheezing, no rhonchi Abdominal: Soft, NT, ND, bowel sounds + Extremities: Bilateral lower extremities chronic venous status is changes with significant edema, may have been mildly improved from admission. Neurology: Alert, awake, following commands.   The results of significant diagnostics from this hospitalization (including imaging, microbiology, ancillary and laboratory) are listed below for reference.     Microbiology: Recent Results (from the past 240 hour(s))  Blood culture (routine x 2)     Status: None   Collection Time: 03/22/16  5:30 AM  Result Value Ref Range Status   Specimen Description BLOOD LEFT HAND  Final   Special Requests BOTTLES DRAWN AEROBIC ONLY  Final   Culture NO GROWTH 5 DAYS  Final    Report Status 03/27/2016 FINAL  Final  Blood culture (routine x 2)     Status: None   Collection Time: 03/22/16  6:18 AM  Result Value Ref Range Status   Specimen Description BLOOD LEFT HAND  Final   Special Requests IN PEDIATRIC BOTTLE  Final   Culture NO GROWTH 5 DAYS  Final   Report Status 03/27/2016 FINAL  Final  MRSA PCR Screening     Status: None   Collection Time: 03/22/16  9:51 PM  Result Value Ref Range Status   MRSA by PCR NEGATIVE NEGATIVE Final    Comment:        The GeneXpert MRSA Assay (FDA approved for NASAL specimens only), is one component of a comprehensive MRSA colonization surveillance program. It is not intended to diagnose MRSA infection nor to guide or monitor treatment for MRSA infections.      Labs: BNP (last 3 results)  Recent Labs  03/09/16 0152 03/22/16 0603 03/24/16 0332  BNP 179.6* 646.0* 344.9*   Basic Metabolic Panel:  Recent Labs Lab 03/26/16 0507 03/27/16 0241 03/28/16 0537 03/29/16 0330 03/30/16 0246  NA 138 140 141 141 142  K 4.1 4.2 3.8 3.9 3.9  CL 106 107 108 106 107  CO2 17* 19* 20* 22 23  GLUCOSE 64* 70 74 64* 78  BUN 61* 61* 63* 59* 59*  CREATININE 5.17* 5.29* 5.14* 5.19* 5.17*  CALCIUM 8.0* 8.4* 8.5* 8.5* 8.3*  MG  --   --   --  1.8  --   PHOS 5.7* 5.4* 5.1* 5.0* 5.0*   Liver Function Tests:  Recent Labs Lab 03/26/16 0507 03/27/16 0241 03/28/16 0537 03/29/16 0330 03/30/16 0246  ALBUMIN 2.1* 2.3* 2.1* 2.0* 1.9*   No results for input(s): LIPASE, AMYLASE in the last 168 hours. No results for input(s): AMMONIA in the last 168 hours. CBC:  Recent Labs Lab 03/24/16 0332 03/24/16 2046 03/25/16 0218 03/26/16 0507 03/27/16 0241  WBC 5.8 5.6 5.1 5.1 5.7  NEUTROABS  --   --   --   --  3.4  HGB 6.3* 8.0* 7.9* 8.4* 9.1*  HCT 20.4* 25.3* 24.7* 26.7* 28.4*  MCV 87.6 85.5 84.3 86.4 85.0  PLT 267 237 253 265 287   Cardiac Enzymes: No results for input(s): CKTOTAL, CKMB, CKMBINDEX, TROPONINI in the last  168 hours. BNP: Invalid input(s): POCBNP CBG:  Recent Labs Lab 03/29/16 0732 03/29/16 1237 03/29/16 1630 03/29/16 2223 03/30/16 0833  GLUCAP 59* 91 90 96 71   D-Dimer No results for input(s): DDIMER in the last 72 hours. Hgb A1c No results for input(s): HGBA1C in the last 72 hours. Lipid Profile No results for input(s): CHOL, HDL, LDLCALC, TRIG, CHOLHDL, LDLDIRECT in the last 72 hours. Thyroid function studies No results for input(s): TSH, T4TOTAL, T3FREE, THYROIDAB in the last 72 hours.  Invalid input(s): FREET3 Anemia work up No results for input(s): VITAMINB12, FOLATE, FERRITIN, TIBC, IRON, RETICCTPCT in the last 72 hours. Urinalysis    Component Value Date/Time   COLORURINE YELLOW 03/22/2016 1213   APPEARANCEUR CLOUDY (A) 03/22/2016 1213   LABSPEC 1.010 03/22/2016 1213   PHURINE 5.0 03/22/2016 1213   GLUCOSEU NEGATIVE 03/22/2016 1213   HGBUR SMALL (A) 03/22/2016 1213   BILIRUBINUR NEGATIVE 03/22/2016 1213   KETONESUR NEGATIVE 03/22/2016 1213   PROTEINUR 100 (A) 03/22/2016 1213   UROBILINOGEN 0.2 10/26/2013 1721   NITRITE NEGATIVE 03/22/2016 1213   LEUKOCYTESUR TRACE (A) 03/22/2016 1213   Sepsis Labs Invalid input(s): PROCALCITONIN,  WBC,  LACTICIDVEN Microbiology Recent Results (from the past 240 hour(s))  Blood culture (routine x 2)     Status: None   Collection Time: 03/22/16  5:30 AM  Result Value Ref Range Status   Specimen Description BLOOD LEFT HAND  Final   Special Requests BOTTLES  DRAWN AEROBIC ONLY 5ML  Final   Culture NO GROWTH 5 DAYS  Final   Report Status 03/27/2016 FINAL  Final  Blood culture (routine x 2)     Status: None   Collection Time: 03/22/16  6:18 AM  Result Value Ref Range Status   Specimen Description BLOOD LEFT HAND  Final   Special Requests IN PEDIATRIC BOTTLE 4ML  Final   Culture NO GROWTH 5 DAYS  Final   Report Status 03/27/2016 FINAL  Final  MRSA PCR Screening     Status: None   Collection Time: 03/22/16  9:51 PM  Result  Value Ref Range Status   MRSA by PCR NEGATIVE NEGATIVE Final    Comment:        The GeneXpert MRSA Assay (FDA approved for NASAL specimens only), is one component of a comprehensive MRSA colonization surveillance program. It is not intended to diagnose MRSA infection nor to guide or monitor treatment for MRSA infections.      Time coordinating discharge: 34 minutes  SIGNED:   Maxie Barbron Prasad Idabelle Mcpeters, MD  [facility] 03/30/2016, 12:36 PM  If 7PM-7AM, please contact night-coverage [email or web address] Password [email or web address]

## 2016-03-30 NOTE — Care Management Note (Signed)
Case Management Note  Patient Details  Name: [name] MRN: [id number] Date of Birth: [birth year]  Subjective/Objective:                  respiratory distress. Action/Plan: Discharge planning Expected Discharge Date:  03/30/16               Expected Discharge Plan:  [place]  In-House Referral:     Discharge planning Services  CM Consult  Post Acute Care Choice:  Home Health Choice offered to:  Patient  DME Arranged:  N/A DME Agency:  [place]:  RN, PT, OT, Nurse's Aide, Social Work [employer] Agency:  [place]  Status of Service:  Completed, signed off  If discussed at [employer], dates discussed:    Additional Comments: CM met with pt to confirm agency for is [facility]; pt confirms. CM called [facility] rep, Jermaine to alert to discharge for [facility]. CM called [facility] for ambulance transport home. No other CM needs were communicated. Dellie Catholic, RN 03/30/2016, 3:31 PM

## 2016-04-11 NOTE — Discharge Summary (Signed)
Family Medicine Teaching [facility] Discharge Summary  Patient name: [name] Medical record number: [id number] Date of birth: 1975/11/17 Age: 41 y.o. Gender: female Date of Admission: 03/08/2016  Date of Discharge: 03/09/16 Admitting Physician: Doreene Eland, MD  Primary Care Provider: Marletta Lor, NP Consultants: none  Indication for Hospitalization: Anemia  Discharge Diagnoses/Problem List:  Patient Active Problem List   Diagnosis Date Noted  . Physical deconditioning   . Goals of care, counseling/discussion   . Palliative care encounter   . Acute pulmonary edema (HCC)   . Influenza A 03/23/2016  . HCAP (healthcare-associated pneumonia) 03/23/2016  . Metabolic acidosis 03/23/2016  . Acute kidney injury superimposed on CKD (HCC) 03/23/2016  . Flu-like symptoms   . Hypertensive emergency   . Acute on chronic diastolic CHF (congestive heart failure) (HCC) 03/22/2016  . Arm swelling 03/09/2016  . Symptomatic anemia 03/08/2016  . Pressure injury of skin 03/08/2016  . Venous stasis ulcer of right calf without varicose veins (HCC)   . History of CVA with residual deficit   . Super-super obese (HCC)   . Anemia of chronic disease   . Lymphedema   . Uncontrolled type 2 diabetes mellitus with complication, with long-term current use of insulin (HCC)   . Lymphocytosis   . Debility   . Dyspnea 12/07/2015  . Acute respiratory failure (HCC)   . E-coli UTI 02/23/2015  . History of stroke 02/19/2015  . Asthma exacerbation 02/19/2015  . Cellulitis of left lower extremity   . Essential hypertension   . Cellulitis of right lower extremity   . Chronic kidney disease (CKD), stage IV (severe) (HCC) 02/10/2015  . Aortic valve mass   . Anasarca 02/05/2015  . Hypoalbuminemia 02/05/2015  . Morbid obesity (HCC) 02/05/2015  . Hypertriglyceridemia 09/16/2013  . Diarrhea 09/16/2013  . Cerebral embolism with cerebral infarction (HCC) 09/04/2013  . Thigh abscess 08/30/2013   . Acute respiratory failure with hypoxia (HCC) 08/30/2013     Disposition: Home  Discharge Condition: stable  Discharge Exam: see previous progress note  Brief Hospital Course:  [name] a 41 y.o.femalewith a past medical historysignificant for CKD IV, chronic diastolic CHF, uncontrolled DM type 2, and morbid obesity who presented  to the ED per PCP request for abnormal kidney function lab results and found to be anemic with a hemoglobin of 6.7. She was asymptomatic. Patient was given 1 unit of transfused packed red blood cells on day of admission. She remained asymptomatic and her vital signs remained stable.   Patient has known chronic kidney disease stage IV. Her creatinine was around baseline at 4.57. Discussed patient with Dr. Arlean Hopping who was on-call from [facility] who stated no need for formal consult and we could resume her home torsemide 60 mg twice a day with close outpatient follow-up with her nephrologist Dr. Allyne Gee.  Additionally patient had some right arm swelling that is concerning for possible DVT. DVT was ruled out with Doppler ultrasound. On day of discharge, patient was at her baseline state of health with stable vital signs.  All other chronic medical conditions stable throughout admission and managed with home regimens.   Issues for Follow Up:  1. Anemia: Follow up with nephrology for Aranesp and repeat CBC at hospital follow-up 2. Right arm swelling: Chronic for patient, continue to monitor in outpatient setting 3. Chronic kidney disease stage IV: Follow-up with nephrology, discharged on home torsemide of 60 mg twice a day 4. Morbid obesity with chronic right thigh wound: Home  health wound care  Significant Procedures: None  Significant Labs and Imaging:  Recent Labs Lab 03/08/16 1436 03/08/16 1510 03/09/16 0152 03/09/16 0754  WBC 7.8  --  7.7  8.0 7.1  HGB 6.7* 7.1* 7.1*  7.1* 7.5*  HCT 22.0* 21.0* 23.0*  22.8* 24.1*  PLT  329  --  296  300 298      Last Labs    Recent Labs Lab 03/08/16 1436 03/08/16 1510 03/09/16 0152  NA 137 140 137  K 4.5 4.6 4.4  CL 110 108 110  CO2 18*  --  18*  BUN 51* 49* 52*  CREATININE 4.60* 5.00* 4.57*  CALCIUM 8.5*  --  8.3*  GLUCOSE 125* 124* 96      Imaging/Diagnostic Tests: Portable Chest 1 View  Result Date: 03/08/2016 CLINICAL DATA:  Congestive heart failure. Abnormal kidney function labs. EXAM: PORTABLE CHEST 1 VIEW COMPARISON:  12/07/2015 FINDINGS: Lungs are hypoinflated without focal consolidation or effusion. Stable cardiomegaly. Remainder the exam is unchanged. IMPRESSION: Hypoinflation without acute cardiopulmonary disease per Mild stable cardiomegaly. Electronically Signed   By: Elberta Fortis M.D.   On: 03/08/2016 19:35    Results/Tests Pending at Time of Discharge: None  Discharge Medications:  Allergies as of 03/09/2016      Reactions   Azithromycin Other (See Comments)   Nose bleeding event      Medication List    STOP taking these medications   doxycycline 100 MG capsule Commonly known as:  VIBRAMYCIN   furosemide 40 MG tablet Commonly known as:  LASIX   predniSONE 20 MG tablet Commonly known as:  DELTASONE     TAKE these medications   albuterol 108 (90 Base) MCG/ACT inhaler Commonly known as:  PROAIR HFA Inhale 2 puffs into the lungs every 6 (six) hours as needed for wheezing or shortness of breath.   albuterol (2.5 MG/3ML) 0.083% nebulizer solution Commonly known as:  PROVENTIL Take 3 mLs (2.5 mg total) by nebulization every 6 (six) hours as needed for wheezing or shortness of breath.   amLODipine 10 MG tablet Commonly known as:  NORVASC Take 1 tablet (10 mg total) by mouth daily.   atorvastatin 20 MG tablet Commonly known as:  LIPITOR Take 20 mg by mouth daily.   carvedilol 25 MG tablet Commonly known as:  COREG Take 1 tablet (25 mg total) by mouth 2 (two) times daily with a meal.   collagenase ointment Commonly  known as:  SANTYL Apply 1 application topically daily.   ferrous sulfate 325 (65 FE) MG tablet Take 1 tablet (325 mg total) by mouth 3 (three) times daily with meals.   insulin aspart 100 UNIT/ML injection Commonly known as:  novoLOG Inject 0-20 Units into the skin 3 (three) times daily with meals. CBG < 70: implement hypoglycemia protocol CBG 70 - 120: 0 units CBG 121 - 150: 3 units CBG 151 - 200: 4 units CBG 201 - 250: 7 units CBG 251 - 300: 11 units CBG 301 - 350: 15 units CBG 351 - 400: 20 units   isosorbide mononitrate 30 MG 24 hr tablet Commonly known as:  IMDUR Take 1 tablet (30 mg total) by mouth daily.   vitamin A 8000 UNIT capsule Take 8,000 Units by mouth daily.       Discharge Instructions: Please refer to Patient Instructions section of EMR for full details.  Patient was counseled important signs and symptoms that should prompt return to medical care, changes in medications, dietary instructions, activity  restrictions, and follow up appointments.   Follow-Up Appointments: Follow-up Information    Dorrene GermanEdwin A Avbuere, MD. Schedule an appointment as soon as possible for a visit in 3 days.   Specialty:  Internal Medicine Why:  Call on 03/11/2016 for appointment Contact information: [address] [place] [address] [phone]           Beaulah Dinninghristina M Kymiah Araiza, MD 04/11/2016, 10:58 AM PGY-2, [facility]

## 2016-05-01 ENCOUNTER — Emergency Department (HOSPITAL_COMMUNITY): Payer: Medicaid Other

## 2016-05-01 ENCOUNTER — Inpatient Hospital Stay (HOSPITAL_COMMUNITY)
Admission: EM | Admit: 2016-05-01 | Discharge: 2016-05-31 | DRG: 673 | Disposition: A | Discharge status: Skilled Nursing Facility | Payer: Medicaid Other | Attending: Family Medicine | Admitting: Family Medicine

## 2016-05-01 ENCOUNTER — Encounter (HOSPITAL_COMMUNITY): Payer: Self-pay | Admitting: Emergency Medicine

## 2016-05-01 DIAGNOSIS — N179 Acute kidney failure, unspecified: Principal | ICD-10-CM | POA: Diagnosis present

## 2016-05-01 DIAGNOSIS — G9341 Metabolic encephalopathy: Secondary | ICD-10-CM

## 2016-05-01 DIAGNOSIS — L97121 Non-pressure chronic ulcer of left thigh limited to breakdown of skin: Secondary | ICD-10-CM | POA: Diagnosis present

## 2016-05-01 DIAGNOSIS — J181 Lobar pneumonia, unspecified organism: Secondary | ICD-10-CM

## 2016-05-01 DIAGNOSIS — R509 Fever, unspecified: Secondary | ICD-10-CM | POA: Diagnosis not present

## 2016-05-01 DIAGNOSIS — E1121 Type 2 diabetes mellitus with diabetic nephropathy: Secondary | ICD-10-CM | POA: Diagnosis present

## 2016-05-01 DIAGNOSIS — T501X5A Adverse effect of loop [high-ceiling] diuretics, initial encounter: Secondary | ICD-10-CM | POA: Diagnosis not present

## 2016-05-01 DIAGNOSIS — J45909 Unspecified asthma, uncomplicated: Secondary | ICD-10-CM | POA: Diagnosis present

## 2016-05-01 DIAGNOSIS — E118 Type 2 diabetes mellitus with unspecified complications: Secondary | ICD-10-CM

## 2016-05-01 DIAGNOSIS — IMO0002 Reserved for concepts with insufficient information to code with codable children: Secondary | ICD-10-CM

## 2016-05-01 DIAGNOSIS — E1122 Type 2 diabetes mellitus with diabetic chronic kidney disease: Secondary | ICD-10-CM | POA: Diagnosis present

## 2016-05-01 DIAGNOSIS — Z7189 Other specified counseling: Secondary | ICD-10-CM

## 2016-05-01 DIAGNOSIS — Z515 Encounter for palliative care: Secondary | ICD-10-CM

## 2016-05-01 DIAGNOSIS — D72829 Elevated white blood cell count, unspecified: Secondary | ICD-10-CM | POA: Diagnosis present

## 2016-05-01 DIAGNOSIS — Z993 Dependence on wheelchair: Secondary | ICD-10-CM | POA: Diagnosis not present

## 2016-05-01 DIAGNOSIS — J9601 Acute respiratory failure with hypoxia: Secondary | ICD-10-CM | POA: Diagnosis present

## 2016-05-01 DIAGNOSIS — L97111 Non-pressure chronic ulcer of right thigh limited to breakdown of skin: Secondary | ICD-10-CM | POA: Diagnosis present

## 2016-05-01 DIAGNOSIS — E1152 Type 2 diabetes mellitus with diabetic peripheral angiopathy with gangrene: Secondary | ICD-10-CM | POA: Diagnosis present

## 2016-05-01 DIAGNOSIS — N186 End stage renal disease: Secondary | ICD-10-CM | POA: Diagnosis not present

## 2016-05-01 DIAGNOSIS — G9340 Encephalopathy, unspecified: Secondary | ICD-10-CM | POA: Diagnosis not present

## 2016-05-01 DIAGNOSIS — Z7401 Bed confinement status: Secondary | ICD-10-CM

## 2016-05-01 DIAGNOSIS — I6930 Unspecified sequelae of cerebral infarction: Secondary | ICD-10-CM

## 2016-05-01 DIAGNOSIS — I10 Essential (primary) hypertension: Secondary | ICD-10-CM | POA: Diagnosis not present

## 2016-05-01 DIAGNOSIS — F0631 Mood disorder due to known physiological condition with depressive features: Secondary | ICD-10-CM | POA: Diagnosis present

## 2016-05-01 DIAGNOSIS — L089 Local infection of the skin and subcutaneous tissue, unspecified: Secondary | ICD-10-CM | POA: Diagnosis not present

## 2016-05-01 DIAGNOSIS — N2581 Secondary hyperparathyroidism of renal origin: Secondary | ICD-10-CM | POA: Diagnosis present

## 2016-05-01 DIAGNOSIS — G92 Toxic encephalopathy: Secondary | ICD-10-CM | POA: Diagnosis present

## 2016-05-01 DIAGNOSIS — D638 Anemia in other chronic diseases classified elsewhere: Secondary | ICD-10-CM | POA: Diagnosis present

## 2016-05-01 DIAGNOSIS — E1169 Type 2 diabetes mellitus with other specified complication: Secondary | ICD-10-CM | POA: Diagnosis not present

## 2016-05-01 DIAGNOSIS — Y95 Nosocomial condition: Secondary | ICD-10-CM | POA: Diagnosis present

## 2016-05-01 DIAGNOSIS — I132 Hypertensive heart and chronic kidney disease with heart failure and with stage 5 chronic kidney disease, or end stage renal disease: Secondary | ICD-10-CM | POA: Diagnosis present

## 2016-05-01 DIAGNOSIS — Z794 Long term (current) use of insulin: Secondary | ICD-10-CM | POA: Diagnosis not present

## 2016-05-01 DIAGNOSIS — J810 Acute pulmonary edema: Secondary | ICD-10-CM | POA: Diagnosis not present

## 2016-05-01 DIAGNOSIS — E876 Hypokalemia: Secondary | ICD-10-CM | POA: Diagnosis not present

## 2016-05-01 DIAGNOSIS — N185 Chronic kidney disease, stage 5: Secondary | ICD-10-CM

## 2016-05-01 DIAGNOSIS — R0600 Dyspnea, unspecified: Secondary | ICD-10-CM

## 2016-05-01 DIAGNOSIS — I890 Lymphedema, not elsewhere classified: Secondary | ICD-10-CM | POA: Diagnosis not present

## 2016-05-01 DIAGNOSIS — Z992 Dependence on renal dialysis: Secondary | ICD-10-CM

## 2016-05-01 DIAGNOSIS — R079 Chest pain, unspecified: Secondary | ICD-10-CM

## 2016-05-01 DIAGNOSIS — E785 Hyperlipidemia, unspecified: Secondary | ICD-10-CM | POA: Diagnosis not present

## 2016-05-01 DIAGNOSIS — J9811 Atelectasis: Secondary | ICD-10-CM | POA: Diagnosis present

## 2016-05-01 DIAGNOSIS — N178 Other acute kidney failure: Secondary | ICD-10-CM | POA: Diagnosis not present

## 2016-05-01 DIAGNOSIS — Z79899 Other long term (current) drug therapy: Secondary | ICD-10-CM

## 2016-05-01 DIAGNOSIS — N189 Chronic kidney disease, unspecified: Secondary | ICD-10-CM | POA: Diagnosis not present

## 2016-05-01 DIAGNOSIS — I69351 Hemiplegia and hemiparesis following cerebral infarction affecting right dominant side: Secondary | ICD-10-CM | POA: Diagnosis not present

## 2016-05-01 DIAGNOSIS — E46 Unspecified protein-calorie malnutrition: Secondary | ICD-10-CM | POA: Diagnosis present

## 2016-05-01 DIAGNOSIS — E1165 Type 2 diabetes mellitus with hyperglycemia: Secondary | ICD-10-CM

## 2016-05-01 DIAGNOSIS — E11622 Type 2 diabetes mellitus with other skin ulcer: Secondary | ICD-10-CM | POA: Diagnosis present

## 2016-05-01 DIAGNOSIS — N051 Unspecified nephritic syndrome with focal and segmental glomerular lesions: Secondary | ICD-10-CM | POA: Diagnosis present

## 2016-05-01 DIAGNOSIS — E8779 Other fluid overload: Secondary | ICD-10-CM | POA: Diagnosis not present

## 2016-05-01 DIAGNOSIS — I5033 Acute on chronic diastolic (congestive) heart failure: Secondary | ICD-10-CM | POA: Diagnosis present

## 2016-05-01 DIAGNOSIS — J189 Pneumonia, unspecified organism: Secondary | ICD-10-CM | POA: Diagnosis present

## 2016-05-01 DIAGNOSIS — L03115 Cellulitis of right lower limb: Secondary | ICD-10-CM | POA: Diagnosis present

## 2016-05-01 DIAGNOSIS — N184 Chronic kidney disease, stage 4 (severe): Secondary | ICD-10-CM | POA: Diagnosis not present

## 2016-05-01 DIAGNOSIS — F419 Anxiety disorder, unspecified: Secondary | ICD-10-CM | POA: Diagnosis present

## 2016-05-01 DIAGNOSIS — B370 Candidal stomatitis: Secondary | ICD-10-CM | POA: Diagnosis not present

## 2016-05-01 DIAGNOSIS — D631 Anemia in chronic kidney disease: Secondary | ICD-10-CM | POA: Diagnosis present

## 2016-05-01 DIAGNOSIS — L8990 Pressure ulcer of unspecified site, unspecified stage: Secondary | ICD-10-CM | POA: Diagnosis not present

## 2016-05-01 DIAGNOSIS — R0902 Hypoxemia: Secondary | ICD-10-CM

## 2016-05-01 DIAGNOSIS — R531 Weakness: Secondary | ICD-10-CM | POA: Diagnosis present

## 2016-05-01 DIAGNOSIS — R5381 Other malaise: Secondary | ICD-10-CM | POA: Diagnosis present

## 2016-05-01 DIAGNOSIS — Z6841 Body Mass Index (BMI) 40.0 and over, adult: Secondary | ICD-10-CM | POA: Diagnosis not present

## 2016-05-01 DIAGNOSIS — E1140 Type 2 diabetes mellitus with diabetic neuropathy, unspecified: Secondary | ICD-10-CM | POA: Diagnosis present

## 2016-05-01 DIAGNOSIS — F329 Major depressive disorder, single episode, unspecified: Secondary | ICD-10-CM | POA: Diagnosis not present

## 2016-05-01 DIAGNOSIS — L98499 Non-pressure chronic ulcer of skin of other sites with unspecified severity: Secondary | ICD-10-CM | POA: Diagnosis not present

## 2016-05-01 HISTORY — DX: Encounter for palliative care: Z51.5

## 2016-05-01 HISTORY — DX: Generalized edema: R60.1

## 2016-05-01 LAB — COMPREHENSIVE METABOLIC PANEL
ALBUMIN: 2.4 g/dL — AB (ref 3.5–5.0)
ALK PHOS: 65 U/L (ref 38–126)
ALT: 8 U/L — ABNORMAL LOW (ref 14–54)
AST: 9 U/L — ABNORMAL LOW (ref 15–41)
Anion gap: 19 — ABNORMAL HIGH (ref 5–15)
BILIRUBIN TOTAL: 0.9 mg/dL (ref 0.3–1.2)
BUN: 98 mg/dL — AB (ref 6–20)
CHLORIDE: 98 mmol/L — AB (ref 101–111)
CO2: 23 mmol/L (ref 22–32)
Calcium: 8.7 mg/dL — ABNORMAL LOW (ref 8.9–10.3)
Creatinine, Ser: 7.96 mg/dL — ABNORMAL HIGH (ref 0.44–1.00)
GFR calc non Af Amer: 6 mL/min — ABNORMAL LOW (ref >60)
GFR, EST AFRICAN AMERICAN: 7 mL/min — AB (ref >60)
GLUCOSE: 102 mg/dL — AB (ref 65–99)
Potassium: 3.5 mmol/L (ref 3.5–5.1)
SODIUM: 140 mmol/L (ref 135–145)
Total Protein: 7.9 g/dL (ref 6.5–8.1)

## 2016-05-01 LAB — URINALYSIS, ROUTINE W REFLEX MICROSCOPIC
Bilirubin Urine: NEGATIVE
Glucose, UA: NEGATIVE mg/dL
KETONES UR: NEGATIVE mg/dL
Leukocytes, UA: NEGATIVE
Nitrite: NEGATIVE
PROTEIN: 30 mg/dL — AB
Specific Gravity, Urine: 1.009 (ref 1.005–1.030)
pH: 5 (ref 5.0–8.0)

## 2016-05-01 LAB — CBC WITH DIFFERENTIAL/PLATELET
Basophils Absolute: 0 10*3/uL (ref 0.0–0.1)
Basophils Relative: 0 %
EOS ABS: 0.1 10*3/uL (ref 0.0–0.7)
EOS PCT: 1 %
HEMATOCRIT: 25.2 % — AB (ref 36.0–46.0)
HEMOGLOBIN: 8.3 g/dL — AB (ref 12.0–15.0)
LYMPHS ABS: 1 10*3/uL (ref 0.7–4.0)
Lymphocytes Relative: 7 %
MCH: 27.6 pg (ref 26.0–34.0)
MCHC: 32.9 g/dL (ref 30.0–36.0)
MCV: 83.7 fL (ref 78.0–100.0)
MONO ABS: 1.9 10*3/uL — AB (ref 0.1–1.0)
MONOS PCT: 13 %
Neutro Abs: 11 10*3/uL — ABNORMAL HIGH (ref 1.7–7.7)
Neutrophils Relative %: 79 %
Platelets: 428 10*3/uL — ABNORMAL HIGH (ref 150–400)
RBC: 3.01 MIL/uL — AB (ref 3.87–5.11)
RDW: 14.3 % (ref 11.5–15.5)
WBC: 14 10*3/uL — AB (ref 4.0–10.5)

## 2016-05-01 LAB — I-STAT TROPONIN, ED: Troponin i, poc: 0 ng/mL (ref 0.00–0.08)

## 2016-05-01 LAB — BRAIN NATRIURETIC PEPTIDE: B NATRIURETIC PEPTIDE 5: 207.5 pg/mL — AB (ref 0.0–100.0)

## 2016-05-01 LAB — I-STAT CG4 LACTIC ACID, ED: Lactic Acid, Venous: 0.66 mmol/L (ref 0.5–1.9)

## 2016-05-01 LAB — CBG MONITORING, ED: Glucose-Capillary: 109 mg/dL — ABNORMAL HIGH (ref 65–99)

## 2016-05-01 MED ORDER — HEPARIN SODIUM (PORCINE) 5000 UNIT/ML IJ SOLN
5000.0000 [IU] | Freq: Three times a day (TID) | INTRAMUSCULAR | Status: DC
  Administered 2016-05-01 – 2016-05-06 (×14): 5000 [IU] via SUBCUTANEOUS
  Filled 2016-05-01 (×13): qty 1

## 2016-05-01 MED ORDER — CARVEDILOL 25 MG PO TABS
25.0000 mg | ORAL_TABLET | Freq: Two times a day (BID) | ORAL | Status: DC
  Administered 2016-05-02 – 2016-05-14 (×20): 25 mg via ORAL
  Filled 2016-05-01 (×25): qty 1

## 2016-05-01 MED ORDER — ATORVASTATIN CALCIUM 20 MG PO TABS
20.0000 mg | ORAL_TABLET | Freq: Every day | ORAL | Status: DC
  Administered 2016-05-02 – 2016-05-13 (×11): 20 mg via ORAL
  Filled 2016-05-01 (×4): qty 1
  Filled 2016-05-01: qty 2
  Filled 2016-05-01 (×4): qty 1
  Filled 2016-05-01: qty 2
  Filled 2016-05-01 (×3): qty 1

## 2016-05-01 MED ORDER — PIPERACILLIN-TAZOBACTAM IN DEX 2-0.25 GM/50ML IV SOLN
2.2500 g | Freq: Three times a day (TID) | INTRAVENOUS | Status: DC
  Administered 2016-05-01 – 2016-05-02 (×3): 2.25 g via INTRAVENOUS
  Filled 2016-05-01 (×4): qty 50

## 2016-05-01 MED ORDER — INSULIN ASPART 100 UNIT/ML ~~LOC~~ SOLN
0.0000 [IU] | Freq: Three times a day (TID) | SUBCUTANEOUS | Status: DC
  Administered 2016-05-02 – 2016-05-04 (×5): 1 [IU] via SUBCUTANEOUS
  Administered 2016-05-04: 2 [IU] via SUBCUTANEOUS
  Administered 2016-05-04: 1 [IU] via SUBCUTANEOUS
  Administered 2016-05-05 – 2016-05-08 (×8): 2 [IU] via SUBCUTANEOUS
  Administered 2016-05-09 (×2): 1 [IU] via SUBCUTANEOUS
  Administered 2016-05-10 – 2016-05-11 (×2): 2 [IU] via SUBCUTANEOUS
  Administered 2016-05-11 – 2016-05-12 (×4): 3 [IU] via SUBCUTANEOUS
  Administered 2016-05-13: 1 [IU] via SUBCUTANEOUS
  Administered 2016-05-13: 2 [IU] via SUBCUTANEOUS
  Administered 2016-05-13: 1 [IU] via SUBCUTANEOUS
  Administered 2016-05-14: 2 [IU] via SUBCUTANEOUS

## 2016-05-01 MED ORDER — ONDANSETRON HCL 4 MG/2ML IJ SOLN
4.0000 mg | Freq: Four times a day (QID) | INTRAMUSCULAR | Status: DC | PRN
  Administered 2016-05-02 – 2016-05-17 (×4): 4 mg via INTRAVENOUS
  Filled 2016-05-01 (×3): qty 2

## 2016-05-01 MED ORDER — ISOSORBIDE MONONITRATE ER 30 MG PO TB24
30.0000 mg | ORAL_TABLET | Freq: Every day | ORAL | Status: DC
  Administered 2016-05-02 – 2016-05-13 (×11): 30 mg via ORAL
  Filled 2016-05-01 (×12): qty 1

## 2016-05-01 MED ORDER — VANCOMYCIN HCL IN DEXTROSE 1-5 GM/200ML-% IV SOLN
1000.0000 mg | Freq: Once | INTRAVENOUS | Status: DC
  Filled 2016-05-01: qty 200

## 2016-05-01 MED ORDER — ALBUTEROL SULFATE HFA 108 (90 BASE) MCG/ACT IN AERS: 2.0000 | INHALATION_SPRAY | Freq: Four times a day (QID) | RESPIRATORY_TRACT | Status: DC | PRN

## 2016-05-01 MED ORDER — INSULIN ASPART 100 UNIT/ML ~~LOC~~ SOLN
0.0000 [IU] | Freq: Every day | SUBCUTANEOUS | Status: DC
  Administered 2016-05-12 – 2016-05-26 (×3): 2 [IU] via SUBCUTANEOUS
  Administered 2016-05-28: 5 [IU] via SUBCUTANEOUS
  Administered 2016-05-29 – 2016-05-30 (×2): 2 [IU] via SUBCUTANEOUS

## 2016-05-01 MED ORDER — PIPERACILLIN-TAZOBACTAM 3.375 G IVPB 30 MIN
3.3750 g | Freq: Once | INTRAVENOUS | Status: AC
  Administered 2016-05-01: 3.375 g via INTRAVENOUS
  Filled 2016-05-01: qty 50

## 2016-05-01 MED ORDER — FERROUS SULFATE 325 (65 FE) MG PO TABS
325.0000 mg | ORAL_TABLET | Freq: Three times a day (TID) | ORAL | Status: DC
  Administered 2016-05-02 – 2016-05-08 (×17): 325 mg via ORAL
  Filled 2016-05-01 (×18): qty 1

## 2016-05-01 MED ORDER — ALBUTEROL SULFATE (2.5 MG/3ML) 0.083% IN NEBU
2.5000 mg | INHALATION_SOLUTION | Freq: Four times a day (QID) | RESPIRATORY_TRACT | Status: DC | PRN
  Administered 2016-05-02 – 2016-05-03 (×2): 2.5 mg via RESPIRATORY_TRACT
  Filled 2016-05-01 (×2): qty 3

## 2016-05-01 MED ORDER — AMLODIPINE BESYLATE 10 MG PO TABS
10.0000 mg | ORAL_TABLET | Freq: Every day | ORAL | Status: DC
  Administered 2016-05-02 – 2016-05-13 (×11): 10 mg via ORAL
  Filled 2016-05-01 (×12): qty 1

## 2016-05-01 MED ORDER — ONDANSETRON HCL 4 MG PO TABS
4.0000 mg | ORAL_TABLET | Freq: Four times a day (QID) | ORAL | Status: DC | PRN
  Administered 2016-05-06 – 2016-05-07 (×3): 4 mg via ORAL
  Filled 2016-05-01 (×5): qty 1

## 2016-05-01 MED ORDER — GUAIFENESIN-DM 100-10 MG/5ML PO SYRP
5.0000 mL | ORAL_SOLUTION | ORAL | Status: DC | PRN
  Administered 2016-05-01: 5 mL via ORAL
  Filled 2016-05-01: qty 10

## 2016-05-01 MED ORDER — HYDRALAZINE HCL 50 MG PO TABS
100.0000 mg | ORAL_TABLET | Freq: Three times a day (TID) | ORAL | Status: DC
  Administered 2016-05-01 – 2016-05-12 (×26): 100 mg via ORAL
  Filled 2016-05-01 (×31): qty 2

## 2016-05-01 MED ORDER — VANCOMYCIN HCL 10 G IV SOLR
2500.0000 mg | Freq: Once | INTRAVENOUS | Status: AC
  Administered 2016-05-01: 2500 mg via INTRAVENOUS
  Filled 2016-05-01: qty 2000

## 2016-05-01 MED ORDER — HYDROCODONE-ACETAMINOPHEN 5-325 MG PO TABS
1.0000 | ORAL_TABLET | ORAL | Status: DC | PRN
  Administered 2016-05-02: 2 via ORAL
  Filled 2016-05-01: qty 2

## 2016-05-01 MED ORDER — CAMPHOR-MENTHOL 0.5-0.5 % EX LOTN: 1.0000 "application " | TOPICAL_LOTION | Freq: Three times a day (TID) | CUTANEOUS | Status: DC | PRN

## 2016-05-01 MED ORDER — HYDROCODONE-ACETAMINOPHEN 5-325 MG PO TABS
1.0000 | ORAL_TABLET | Freq: Once | ORAL | Status: AC
  Administered 2016-05-01: 1 via ORAL
  Filled 2016-05-01: qty 1

## 2016-05-01 NOTE — ED Provider Notes (Signed)
[facility]-EMERGENCY DEPT Provider Note   CSN: [id number] Arrival date & time: 05/01/16  1125     History   Chief Complaint Chief Complaint  Patient presents with  . wounds    HPI [name] is a 41 y.o. female.  Patient is a is a 41 year old morbidly obese female with a history of asthma, CHF, CVA, hypertension, diabetes and recent pneumonia approximately one month ago presenting today with general weakness, fever, new skin breakdown on her left leg and cough. Family states for the last 3-4 days she has not eaten. She states it just because she does not have an appetite. She denies any abdominal pain or vomiting. She is also noticed a new nonproductive cough for the last 1 week. She did not know she had a fever until today when EMS checked her temperature was 102. Patient denies any urinary symptoms however family states she was usually able to stand to use the bathroom and to be cleaned but in the last 4-5 days she has been unable to get out of bed. No altered mental status. Home health nurse has continued to change the bandages on her wounds. The left leg wound has been there for months but the right leg wound has only occurred in the last week. They've been trying local therapies but it is not improving.   The history is provided by the patient and a relative.    Past Medical History:  Diagnosis Date  . Asthma   . CHF (congestive heart failure) (HCC)    diastolic  . CVA (cerebral infarction) 1990's   "writing is not the same since" R leg weakness   . GERD (gastroesophageal reflux disease)   . Hypertension   . Morbid obesity (HCC)   . Stroke (HCC)   . Type II diabetes mellitus [facility](HCC)     Patient Active Problem List   Diagnosis Date Noted  . Physical deconditioning   . Goals of care, counseling/discussion   . Palliative care encounter   . Acute pulmonary edema (HCC)   . Influenza A 03/23/2016  . HCAP (healthcare-associated pneumonia) 03/23/2016  . Metabolic  acidosis 03/23/2016  . Acute kidney injury superimposed on CKD (HCC) 03/23/2016  . Flu-like symptoms   . Hypertensive emergency   . Acute on chronic diastolic CHF (congestive heart failure) (HCC) 03/22/2016  . Arm swelling 03/09/2016  . Symptomatic anemia 03/08/2016  . Pressure injury of skin 03/08/2016  . Venous stasis ulcer of right calf without varicose veins (HCC)   . History of CVA with residual deficit   . Super-super obese (HCC)   . Anemia of chronic disease   . Lymphedema   . Uncontrolled type 2 diabetes mellitus with complication, with long-term current use of insulin (HCC)   . Lymphocytosis   . Debility   . Dyspnea 12/07/2015  . Acute respiratory failure (HCC)   . E-coli UTI 02/23/2015  . History of stroke 02/19/2015  . Asthma exacerbation 02/19/2015  . Cellulitis of left lower extremity   . Essential hypertension   . Cellulitis of right lower extremity   . Chronic kidney disease (CKD), stage IV (severe) (HCC) 02/10/2015  . Aortic valve mass   . Anasarca 02/05/2015  . Hypoalbuminemia 02/05/2015  . Morbid obesity (HCC) 02/05/2015  . Hypertriglyceridemia 09/16/2013  . Diarrhea 09/16/2013  . Cerebral embolism with cerebral infarction (HCC) 09/04/2013  . Thigh abscess 08/30/2013  . Acute respiratory failure with hypoxia (HCC) 08/30/2013    Past Surgical History:  Procedure Laterality Date  .  INCISION AND DRAINAGE OF WOUND Left 08/30/2013   thigh necrotic wound/notes 08/30/2013  . IRRIGATION AND DEBRIDEMENT ABSCESS Left 08/30/2013   Procedure: IRRIGATION AND DEBRIDEMENT ABSCESS Left Thigh Necrotic Wound;  Surgeon: [name], MD;  Location: [facility] OR;  Service: General;  Laterality: Left;  . TEE WITHOUT CARDIOVERSION N/A 09/07/2013   Procedure: TRANSESOPHAGEAL ECHOCARDIOGRAM (TEE);  Surgeon: [name], MD;  Location: [facility] ENDOSCOPY;  Service: Cardiovascular;  Laterality: N/A;  . TEE WITHOUT CARDIOVERSION N/A 02/08/2015   Procedure: TRANSESOPHAGEAL ECHOCARDIOGRAM  (TEE);  Surgeon: Chilton Si, MD;  Location: [facility] ENDOSCOPY;  Service: Cardiovascular;  Laterality: N/A;    OB History    Gravida Para Term Preterm AB Living   3 3 3  0 0 3   SAB TAB Ectopic Multiple Live Births   0 0 0 0 3       Home Medications    Prior to Admission medications   Medication Sig Start Date End Date Taking? Authorizing Provider  albuterol (PROAIR HFA) 108 (90 Base) MCG/ACT inhaler Inhale 2 puffs into the lungs every 6 (six) hours as needed for wheezing or shortness of breath. 08/12/15   Charm Rings, MD  albuterol (PROVENTIL) (2.5 MG/3ML) 0.083% nebulizer solution Take 3 mLs (2.5 mg total) by nebulization every 6 (six) hours as needed for wheezing or shortness of breath. 08/12/15   Charm Rings, MD  amLODipine (NORVASC) 10 MG tablet Take 1 tablet (10 mg total) by mouth daily. 01/28/16   Eyvonne Mechanic, PA-C  atorvastatin (LIPITOR) 20 MG tablet Take 20 mg by mouth daily.    Historical Provider, MD  camphor-menthol [facility]) lotion Apply 1 application topically every 8 (eight) hours as needed for itching. 03/30/16   Dron Jaynie Collins, MD  carvedilol (COREG) 25 MG tablet Take 1 tablet (25 mg total) by mouth 2 (two) times daily with a meal. 01/28/16   Eyvonne Mechanic, PA-C  collagenase (SANTYL) ointment Apply 1 application topically daily.    Historical Provider, MD  ferrous sulfate 325 (65 FE) MG tablet Take 1 tablet (325 mg total) by mouth 3 (three) times daily with meals. 01/28/16   Eyvonne Mechanic, PA-C  furosemide (LASIX) 80 MG tablet Take 2 tablets (160 mg total) by mouth every 6 (six) hours. 03/30/16   Dron Jaynie Collins, MD  hydrALAZINE (APRESOLINE) 50 MG tablet Take 2 tablets (100 mg total) by mouth 3 (three) times daily. 03/30/16   Dron Jaynie Collins, MD  insulin aspart (NOVOLOG) 100 UNIT/ML injection Inject 0-20 Units into the skin 3 (three) times daily with meals. CBG < 70: implement hypoglycemia protocol CBG 70 - 120: 0 units CBG 121 - 150: 3 units CBG 151 - 200:  4 units CBG 201 - 250: 7 units CBG 251 - 300: 11 units CBG 301 - 350: 15 units CBG 351 - 400: 20 units 01/28/16   Eyvonne Mechanic, PA-C  isosorbide mononitrate (IMDUR) 30 MG 24 hr tablet Take 1 tablet (30 mg total) by mouth daily. 01/28/16   Eyvonne Mechanic, PA-C  metolazone (ZAROXOLYN) 10 MG tablet Take 1 tablet (10 mg total) by mouth daily. 03/31/16   Dron Jaynie Collins, MD  potassium chloride SA (K-DUR,KLOR-CON) 20 MEQ tablet Take 2 tablets (40 mEq total) by mouth daily. 03/31/16   Dron Jaynie Collins, MD  vitamin A 8000 UNIT capsule Take 8,000 Units by mouth daily.    Historical Provider, MD    Family History Family History  Problem Relation Age of Onset  . Diabetes Mother   .  Kidney disease Mother     Social History Social History  Substance Use Topics  . Smoking status: Never Smoker  . Smokeless tobacco: Never Used  . Alcohol use No     Allergies   Azithromycin   Review of Systems Review of Systems  All other systems reviewed and are negative.    Physical Exam Updated Vital Signs Temp 99.9 F (37.7 C) (Oral)   Wt (!) 318 lb (144.2 kg)   BMI 51.33 kg/m   Physical Exam  Constitutional: She is oriented to person, place, and time. She appears well-developed and well-nourished. No distress.  Morbidly obese  HENT:  Head: Normocephalic and atraumatic.  Mouth/Throat: Oropharynx is clear and moist.  Eyes: Conjunctivae and EOM are normal. Pupils are equal, round, and reactive to light.  Neck: Normal range of motion. Neck supple.  Cardiovascular: Normal rate, regular rhythm and intact distal pulses.   No murmur heard. Pulmonary/Chest: Effort normal and breath sounds normal. No respiratory distress. She has no wheezes. She has no rales.  Decreased breath sounds throughout however given patient's size and the chest wall  Abdominal: Soft. She exhibits no distension. There is no tenderness. There is no rebound and no guarding.  Musculoskeletal: Normal range of motion.  She exhibits edema and tenderness.  Bilateral lymphedema and lower extremities.  Wounds present on bilateral inner thighs. Right wound with healing phase without surrounding erythema or drainage. Left wounds is red with mild surrounding erythema but no significant drainage. No tunneling wounds present.  Neurological: She is alert and oriented to person, place, and time.  Skin: Skin is warm and dry. No rash noted. No erythema.  Psychiatric: She has a normal mood and affect. Her behavior is normal.  Nursing note and vitals reviewed.    ED Treatments / Results  Labs (all labs ordered are listed, but only abnormal results are displayed) Labs Reviewed  COMPREHENSIVE METABOLIC PANEL - Abnormal; Notable for the following:       Result Value   Chloride 98 (*)    Glucose, Bld 102 (*)    BUN 98 (*)    Creatinine, Ser 7.96 (*)    Calcium 8.7 (*)    Albumin 2.4 (*)    AST 9 (*)    ALT 8 (*)    GFR calc non Af Amer 6 (*)    GFR calc Af Amer 7 (*)    Anion gap 19 (*)    All other components within normal limits  CBC WITH DIFFERENTIAL/PLATELET - Abnormal; Notable for the following:    WBC 14.0 (*)    RBC 3.01 (*)    Hemoglobin 8.3 (*)    HCT 25.2 (*)    Platelets 428 (*)    Neutro Abs 11.0 (*)    Monocytes Absolute 1.9 (*)    All other components within normal limits  URINALYSIS, ROUTINE W REFLEX MICROSCOPIC - Abnormal; Notable for the following:    Color, Urine STRAW (*)    Hgb urine dipstick SMALL (*)    Protein, ur 30 (*)    Bacteria, UA RARE (*)    Squamous Epithelial / LPF 0-5 (*)    All other components within normal limits  BRAIN NATRIURETIC PEPTIDE - Abnormal; Notable for the following:    B Natriuretic Peptide 207.5 (*)    All other components within normal limits  CULTURE, BLOOD (ROUTINE X 2)  CULTURE, BLOOD (ROUTINE X 2)  URINE CULTURE  I-STAT CG4 LACTIC ACID, ED  Rosezena Sensor, ED  I-STAT CG4 LACTIC ACID, ED    EKG  EKG Interpretation  Date/Time:  Wednesday  May 01 2016 11:59:16 EDT Ventricular Rate:  95 PR Interval:    QRS Duration: 102 QT Interval:  393 QTC Calculation: 495 R Axis:   61 Text Interpretation:  Sinus rhythm Borderline T wave abnormalities Borderline prolonged QT interval No significant change since last tracing Confirmed by Anitra Lauth  MD, Alphonzo Lemmings ([id number]) on 05/01/2016 12:13:33 PM       Radiology Dg Chest 2 View  Result Date: 05/01/2016 CLINICAL DATA:  Fever and weakness. EXAM: CHEST  2 VIEW COMPARISON:  03/22/2016. FINDINGS: Cardiomegaly with mild bilateral interstitial prominence. No pleural effusion or pneumothorax. No acute bony abnormality. IMPRESSION: Cardiomegaly with mild bilateral interstitial prominence consistent with CHF. Findings have improved from prior study of 03/22/2016. Given patient's history of fever pneumonitis cannot be excluded. Electronically Signed   By: Maisie Fus  Register   On: 05/01/2016 13:30    Procedures Procedures (including critical care time)  Medications Ordered in ED Medications - No data to display   Initial Impression / Assessment and Plan / ED Course  I have reviewed the triage vital signs and the nursing notes.  Pertinent labs & imaging results that were available during my care of the patient were reviewed by me and considered in my medical decision making (see chart for details).     Patient is a 41 year old morbidly obese female presenting today with generalized weakness, fever, new leg wound and a cough.  Patient has a significant history for lymphedema in the lower extremities as well as new wound occurring on the left leg because she has been unable to get out of bed for the last week. Patient also has not eaten in the last 4 days due to decreased appetite. Also noted today to have a fever. She feels for the last week she has had a cough but denies shortness of breath at this time. She denies any abdominal pain nausea or vomiting. On exam she has 2 wounds on the inner thighs. There  are no tunneling wounds. The right wound does not appear infected. The left forearm does have surrounding erythema but no significant drainage. Low suspicion for abscess at this time. Possible early cellulitis however also concern for potential pneumonia or UTI as the source of her fever and weakness. Vital signs are stable but she was febrile here at 100.6 upon arrival. She had already received Tylenol for fever. Sepsis order set initiated.  2:28 PM Pt found to have leukocytosis of 14, normal lactate, normal trop and improved BNP.  CXR unable to r/o pneumonitis and new AKI on chronic renal insufficiency with Cr of 8.  Patient does not have evidence of UTI or obstruction. Elevated creatinine may be a result of taking Zaroxolyn and Lasix which he has continued to do since discharge from the hospital in February. Patient was covered with Vanco and Zosyn for potential healthcare associated pneumonia. We'll discuss with nephrology and the hospitalist.  Dr. Arlean Hopping with renal stated pt can stay at [facility] and he will consult  Final Clinical Impressions(s) / ED Diagnoses   Final diagnoses:  Acute kidney injury (HCC)  HCAP (healthcare-associated pneumonia)    New Prescriptions New Prescriptions   No medications on file     Gwyneth Sprout, MD 05/01/16 1507

## 2016-05-01 NOTE — Progress Notes (Signed)
Pharmacy Antibiotic Note  [name] is a morbidly obese 41 y.o. female admitted on 05/01/2016 with sepsis.  She was admitted last month with influenza/PNA.  She presents today with fever, weakness, loss of appetite & new skin breakdown of old wounds.   Pharmacy has been consulted for Vancomycin & Zosyn dosing. She has Stage V chronic kidney disease (not yet dialysis dependent per chart review).  NCrCl~4310ml/min.   Initial Vanc/Zosyn doses ordered in ED.  Plan: Zosyn 2.25gm IV q8h after initial dose of 3.375gm. Change Vancomycin 2500mg  IV x1. Check random Vancomycin level in 2 days and re-dose when Vanc level<20. Monitor renal function and cx data.  Daily Scr while on Vancomycin.  Weight: (!) 318 lb (144.2 kg)  Temp (24hrs), Avg:100.3 F (37.9 C), Min:99.9 F (37.7 C), Max:100.6 F (38.1 C)   Recent Labs Lab 05/01/16 1218 05/01/16 1230  WBC 14.0*  --   CREATININE 7.96*  --   LATICACIDVEN  --  0.66    Estimated Creatinine Clearance: 14 mL/min (A) (by C-G formula based on SCr of 7.96 mg/dL (H)).    Allergies  Allergen Reactions  . Azithromycin Other (See Comments)    Nose bleeding event    Antimicrobials this admission: 3/21 Vanc >>  3/21 Zosyn >>   Dose adjustments this admission:  Microbiology results: 3/21 BCx: sent  Thank you for allowing pharmacy to be a part of this patient's care.  [name], [name] 05/01/2016 1:09 PM

## 2016-05-01 NOTE — ED Notes (Signed)
MD at bedside starting IV

## 2016-05-01 NOTE — H&P (Addendum)
History and Physical    Porschea Borys ZOX:[id number] DOB: 12-13-1975 DOA: 05/01/2016  I have briefly reviewed the patient's prior medical records in [facility]  PCP: Marletta Lor, NP  Patient coming from: Home  Chief Complaint: Weakness, fever  HPI: [name] is a 41 y.o. female with medical history significant of morbid obesity, chronic kidney disease stage IV-V, diabetes mellitus, hypertension, prior CVA, diastolic CHF, presents to the emergency room with chief complaint of weakness as well as fever.  Patient has been having progressive weakness over the last 1 week, to the point that she is unable to get herself out of bed.  She is quite a poor historian but the husband is at bedside and is able to contribute to the story.  She has been feeling progressively weak, and had difficulty standing in the last 3-4 days and has been bedbound over the last few days.  EMS was called yesterday due to the patient's persistent weakness, and when they arrived she was febrile to 102.  She complains of a cough that has been progressive over the last week, it is "wet sounding" has not been able to bring up much sputum.  She reports compliance with all her medications including her diuretics.  She has no abdominal pain, no nausea, vomiting or diarrhea.  She denies any chest pain.  She complains of mild shortness of breath.  She has a home health nurse that comes for her chronic lower extremity wounds, and her dressings are changed regularly.  ED Course: In the emergency room, patient had a low-grade temp of 99.9, otherwise her vital signs are stable, blood work is pertinent for a creatinine of 8 from her baseline of 5 just a few weeks ago, she has a leukocytosis of 14.  Her white count is 0.66.  Chest x-ray showed mild evidence of fluid overload versus pneumonitis.  Review of Systems: As per HPI otherwise 10 point review of systems negative.   Past Medical History:  Diagnosis Date  .  Asthma   . CHF (congestive heart failure) (HCC)    diastolic  . CVA (cerebral infarction) 1990's   "writing is not the same since" R leg weakness   . GERD (gastroesophageal reflux disease)   . Hypertension   . Morbid obesity (HCC)   . Stroke (HCC)   . Type II diabetes mellitus (HCC)     Past Surgical History:  Procedure Laterality Date  . INCISION AND DRAINAGE OF WOUND Left 08/30/2013   thigh necrotic wound/notes 08/30/2013  . IRRIGATION AND DEBRIDEMENT ABSCESS Left 08/30/2013   Procedure: IRRIGATION AND DEBRIDEMENT ABSCESS Left Thigh Necrotic Wound;  Surgeon: [name], MD;  Location: [facility] OR;  Service: General;  Laterality: Left;  . TEE WITHOUT CARDIOVERSION N/A 09/07/2013   Procedure: TRANSESOPHAGEAL ECHOCARDIOGRAM (TEE);  Surgeon: [name], MD;  Location: [facility] ENDOSCOPY;  Service: Cardiovascular;  Laterality: N/A;  . TEE WITHOUT CARDIOVERSION N/A 02/08/2015   Procedure: TRANSESOPHAGEAL ECHOCARDIOGRAM (TEE);  Surgeon: Chilton Si, MD;  Location: [facility] ENDOSCOPY;  Service: Cardiovascular;  Laterality: N/A;     reports that she has never smoked. She has never used smokeless tobacco. She reports that she does not drink alcohol or use drugs.  Allergies  Allergen Reactions  . Azithromycin Other (See Comments)    Nose bleeding event    Family History  Problem Relation Age of Onset  . Diabetes Mother   . Kidney disease Mother     Prior to Admission medications  Medication Sig Start Date End Date Taking? Authorizing Provider  albuterol (PROAIR HFA) 108 (90 Base) MCG/ACT inhaler Inhale 2 puffs into the lungs every 6 (six) hours as needed for wheezing or shortness of breath. 08/12/15   Charm RingsErin J Honig, MD  albuterol (PROVENTIL) (2.5 MG/3ML) 0.083% nebulizer solution Take 3 mLs (2.5 mg total) by nebulization every 6 (six) hours as needed for wheezing or shortness of breath. 08/12/15   Charm RingsErin J Honig, MD  amLODipine (NORVASC) 10 MG tablet Take 1 tablet (10 mg total) by mouth daily.  01/28/16   Eyvonne MechanicJeffrey Hedges, PA-C  atorvastatin (LIPITOR) 20 MG tablet Take 20 mg by mouth daily.    Historical Provider, MD  camphor-menthol [facility](SARNA) lotion Apply 1 application topically every 8 (eight) hours as needed for itching. 03/30/16   Dron Jaynie CollinsPrasad Bhandari, MD  carvedilol (COREG) 25 MG tablet Take 1 tablet (25 mg total) by mouth 2 (two) times daily with a meal. 01/28/16   Eyvonne MechanicJeffrey Hedges, PA-C  collagenase (SANTYL) ointment Apply 1 application topically daily.    Historical Provider, MD  ferrous sulfate 325 (65 FE) MG tablet Take 1 tablet (325 mg total) by mouth 3 (three) times daily with meals. 01/28/16   Eyvonne MechanicJeffrey Hedges, PA-C  furosemide (LASIX) 80 MG tablet Take 2 tablets (160 mg total) by mouth every 6 (six) hours. 03/30/16   Dron Jaynie CollinsPrasad Bhandari, MD  hydrALAZINE (APRESOLINE) 50 MG tablet Take 2 tablets (100 mg total) by mouth 3 (three) times daily. 03/30/16   Dron Jaynie CollinsPrasad Bhandari, MD  insulin aspart (NOVOLOG) 100 UNIT/ML injection Inject 0-20 Units into the skin 3 (three) times daily with meals. CBG < 70: implement hypoglycemia protocol CBG 70 - 120: 0 units CBG 121 - 150: 3 units CBG 151 - 200: 4 units CBG 201 - 250: 7 units CBG 251 - 300: 11 units CBG 301 - 350: 15 units CBG 351 - 400: 20 units 01/28/16   Eyvonne MechanicJeffrey Hedges, PA-C  isosorbide mononitrate (IMDUR) 30 MG 24 hr tablet Take 1 tablet (30 mg total) by mouth daily. 01/28/16   Eyvonne MechanicJeffrey Hedges, PA-C  metolazone (ZAROXOLYN) 10 MG tablet Take 1 tablet (10 mg total) by mouth daily. 03/31/16   Dron Jaynie CollinsPrasad Bhandari, MD  potassium chloride SA (K-DUR,KLOR-CON) 20 MEQ tablet Take 2 tablets (40 mEq total) by mouth daily. 03/31/16   Dron Jaynie CollinsPrasad Bhandari, MD  vitamin A 8000 UNIT capsule Take 8,000 Units by mouth daily.    Historical Provider, MD    Physical Exam: Vitals:   05/01/16 1155 05/01/16 1156 05/01/16 1350 05/01/16 1437  BP: (!) 150/71  119/68 139/75  Pulse: 95  92 91  Resp: (!) 22  (!) 24 (!) 25  Temp: 99.9 F (37.7 C)     TempSrc:  Oral     SpO2: 94%  95% 95%  Weight:  (!) 144.2 kg (318 lb)        Constitutional: NAD, calm, comfortable, morbidly obese African-American woman Vitals:   05/01/16 1155 05/01/16 1156 05/01/16 1350 05/01/16 1437  BP: (!) 150/71  119/68 139/75  Pulse: 95  92 91  Resp: (!) 22  (!) 24 (!) 25  Temp: 99.9 F (37.7 C)     TempSrc: Oral     SpO2: 94%  95% 95%  Weight:  (!) 144.2 kg (318 lb)     Eyes: PERRL, lids and conjunctivae normal ENMT: Mucous membranes are moist. Posterior pharynx clear of any exudate or lesions..  Neck: normal, supple, no masses Respiratory: Overall decreased breath  sounds, no wheezing, no crackles, exam very difficult due to body habitus Cardiovascular: Regular rate and rhythm, no murmurs / rubs / gallops. Trace extremity edema.  Exam difficult due to body habitus Abdomen: no tenderness, no masses palpated. Bowel sounds positive.  Musculoskeletal: no clubbing / cyanosis. Skin: Bilateral inner thigh wounds, very mild surrounding erythema but no significant drainage.  Did not appear actively infected. Neurologic: CN 2-12 grossly intact. Strength 5/5 in all 4.  Psychiatric: Normal judgment and insight. Alert and oriented x 3. Normal mood.   Labs on Admission: I have personally reviewed following labs and imaging studies  CBC:  Recent Labs Lab 05/01/16 1218  WBC 14.0*  NEUTROABS 11.0*  HGB 8.3*  HCT 25.2*  MCV 83.7  PLT 428*   Basic Metabolic Panel:  Recent Labs Lab 05/01/16 1218  NA 140  K 3.5  CL 98*  CO2 23  GLUCOSE 102*  BUN 98*  CREATININE 7.96*  CALCIUM 8.7*   GFR: Estimated Creatinine Clearance: 14 mL/min (A) (by C-G formula based on SCr of 7.96 mg/dL (H)). Liver Function Tests:  Recent Labs Lab 05/01/16 1218  AST 9*  ALT 8*  ALKPHOS 65  BILITOT 0.9  PROT 7.9  ALBUMIN 2.4*   No results for input(s): LIPASE, AMYLASE in the last 168 hours. No results for input(s): AMMONIA in the last 168 hours. Coagulation Profile: No  results for input(s): INR, PROTIME in the last 168 hours. Cardiac Enzymes: No results for input(s): CKTOTAL, CKMB, CKMBINDEX, TROPONINI in the last 168 hours. BNP (last 3 results) No results for input(s): PROBNP in the last 8760 hours. HbA1C: No results for input(s): HGBA1C in the last 72 hours. CBG: No results for input(s): GLUCAP in the last 168 hours. Lipid Profile: No results for input(s): CHOL, HDL, LDLCALC, TRIG, CHOLHDL, LDLDIRECT in the last 72 hours. Thyroid Function Tests: No results for input(s): TSH, T4TOTAL, FREET4, T3FREE, THYROIDAB in the last 72 hours. Anemia Panel: No results for input(s): VITAMINB12, FOLATE, FERRITIN, TIBC, IRON, RETICCTPCT in the last 72 hours. Urine analysis:    Component Value Date/Time   COLORURINE STRAW (A) 05/01/2016 1144   APPEARANCEUR CLEAR 05/01/2016 1144   LABSPEC 1.009 05/01/2016 1144   PHURINE 5.0 05/01/2016 1144   GLUCOSEU NEGATIVE 05/01/2016 1144   HGBUR SMALL (A) 05/01/2016 1144   BILIRUBINUR NEGATIVE 05/01/2016 1144   KETONESUR NEGATIVE 05/01/2016 1144   PROTEINUR 30 (A) 05/01/2016 1144   UROBILINOGEN 0.2 10/26/2013 1721   NITRITE NEGATIVE 05/01/2016 1144   LEUKOCYTESUR NEGATIVE 05/01/2016 1144     Radiological Exams on Admission: Dg Chest 2 View  Result Date: 05/01/2016 CLINICAL DATA:  Fever and weakness. EXAM: CHEST  2 VIEW COMPARISON:  03/22/2016. FINDINGS: Cardiomegaly with mild bilateral interstitial prominence. No pleural effusion or pneumothorax. No acute bony abnormality. IMPRESSION: Cardiomegaly with mild bilateral interstitial prominence consistent with CHF. Findings have improved from prior study of 03/22/2016. Given patient's history of fever pneumonitis cannot be excluded. Electronically Signed   By: Maisie Fus  Register   On: 05/01/2016 13:30    EKG: Independently reviewed.  Sinus rhythm  Assessment/Plan Active Problems:   Morbid obesity (HCC)   Chronic kidney disease (CKD), stage IV (severe) (HCC)   Essential  hypertension   Cellulitis of right lower extremity   History of CVA with residual deficit   Anemia of chronic disease   Debility   HCAP (healthcare-associated pneumonia)   Acute kidney injury superimposed on CKD (HCC)   HCAP / febrile illness -Concern for pneumonia given  cough, fever and chills at home as well as elevated white count.  Patient started on vancomycin and Zosyn in the emergency room, continue -Doubt that the wounds can cause her leukocytosis and fever  Acute kidney injury on chronic kidney disease stage IV-V -Nephrology has been consulted by EDP, Dr. Arlean Hopping is to see the patient later -For now I will not resume diuretics and await nephrology consultation, she may be on the dry side  Hypertension -Resume home medications  History of CVA -Obtain physical therapy consultation  Chronic lower extremity wounds -wound consult  Morbid obesity -Consult dietitian  Type 2 diabetes mellitus -Most recent A1c in January 2018 was 5.2. -Place patient on sliding scale  Chronic diastolic CHF -Has a degree of very mild fluid overload, chest x-ray looks improved however compared to the previous one -will hold diuretics for now and monitor renal function  History of asthma -No wheezing, resume home medications    DVT prophylaxis: heparin  Code Status: Full  Family Communication: d/w husband bedside Disposition Plan: admit to [facility] called: nephrology, Dr.Schertz     Admission status: Inpatient   At the time of admission, it appears that the appropriate admission status for this patient is INPATIENT. This is judged to be reasonable and necessary in order to provide the required high service intensity to ensure the patient's safety given the presenting symptoms, physical exam findings, and initial radiographic and laboratory data in the context of their chronic comorbidities. Current circumstances are felt to place patient at high risk for further clinical  deterioration threatening life, limb, or organ. Moreover, it is my clinical judgment that the patient will require inpatient hospital care spanning beyond 2 midnights from the point of admission and that early discharge would result in unnecessary risk of decompensation and readmission or threat to life, limb or bodily function.   Pamella Pert, MD [facility] Pager [phone]  If 7PM-7AM, please contact night-coverage [email or web address] Password [facility]  05/01/2016, 3:19 PM

## 2016-05-01 NOTE — ED Triage Notes (Signed)
Per EMS-bedsores in between thighs-has been going on for over 4 months-states increased pain and weakness-temporal temp was 102.0-given 1000 mg of Tylenol PO

## 2016-05-02 ENCOUNTER — Inpatient Hospital Stay (HOSPITAL_COMMUNITY): Payer: Medicaid Other

## 2016-05-02 ENCOUNTER — Encounter (HOSPITAL_COMMUNITY): Payer: Self-pay | Admitting: Radiology

## 2016-05-02 LAB — COMPREHENSIVE METABOLIC PANEL
ALT: 7 U/L — ABNORMAL LOW (ref 14–54)
ANION GAP: 18 — AB (ref 5–15)
AST: 10 U/L — ABNORMAL LOW (ref 15–41)
Albumin: 2.2 g/dL — ABNORMAL LOW (ref 3.5–5.0)
Alkaline Phosphatase: 60 U/L (ref 38–126)
BUN: 108 mg/dL — ABNORMAL HIGH (ref 6–20)
CALCIUM: 8.4 mg/dL — AB (ref 8.9–10.3)
CHLORIDE: 99 mmol/L — AB (ref 101–111)
CO2: 23 mmol/L (ref 22–32)
Creatinine, Ser: 7.9 mg/dL — ABNORMAL HIGH (ref 0.44–1.00)
GFR, EST AFRICAN AMERICAN: 7 mL/min — AB (ref >60)
GFR, EST NON AFRICAN AMERICAN: 6 mL/min — AB (ref >60)
Glucose, Bld: 120 mg/dL — ABNORMAL HIGH (ref 65–99)
Potassium: 3.9 mmol/L (ref 3.5–5.1)
SODIUM: 140 mmol/L (ref 135–145)
Total Bilirubin: 1 mg/dL (ref 0.3–1.2)
Total Protein: 7.1 g/dL (ref 6.5–8.1)

## 2016-05-02 LAB — CBC
HCT: 19.2 % — ABNORMAL LOW (ref 36.0–46.0)
HCT: 20.8 % — ABNORMAL LOW (ref 36.0–46.0)
HEMOGLOBIN: 6.6 g/dL — AB (ref 12.0–15.0)
Hemoglobin: 6.8 g/dL — CL (ref 12.0–15.0)
MCH: 28.8 pg (ref 26.0–34.0)
MCH: 27.1 pg (ref 26.0–34.0)
MCHC: 34.4 g/dL (ref 30.0–36.0)
MCHC: 32.7 g/dL (ref 30.0–36.0)
MCV: 83.8 fL (ref 78.0–100.0)
MCV: 82.9 fL (ref 78.0–100.0)
PLATELETS: 475 10*3/uL — AB (ref 150–400)
Platelets: 498 10*3/uL — ABNORMAL HIGH (ref 150–400)
RBC: 2.29 MIL/uL — AB (ref 3.87–5.11)
RBC: 2.51 MIL/uL — ABNORMAL LOW (ref 3.87–5.11)
RDW: 14.6 % (ref 11.5–15.5)
RDW: 14.2 % (ref 11.5–15.5)
WBC: 13.1 10*3/uL — AB (ref 4.0–10.5)
WBC: 13.4 10*3/uL — AB (ref 4.0–10.5)

## 2016-05-02 LAB — BLOOD CULTURE ID PANEL (REFLEXED)

## 2016-05-02 LAB — URINE CULTURE: Culture: NO GROWTH

## 2016-05-02 LAB — BLOOD GAS, ARTERIAL
Acid-Base Excess: 1.5 mmol/L (ref 0.0–2.0)
Bicarbonate: 24.9 mmol/L (ref 20.0–28.0)
Drawn by: 331471
FIO2: 100
O2 Saturation: 99.7 %
Patient temperature: 98.6
pCO2 arterial: 36.8 mmHg (ref 32.0–48.0)
pH, Arterial: 7.446 (ref 7.350–7.450)
pO2, Arterial: 263 mmHg — ABNORMAL HIGH (ref 83.0–108.0)

## 2016-05-02 LAB — ABO/RH: ABO/RH(D): A POS

## 2016-05-02 LAB — GLUCOSE, CAPILLARY
GLUCOSE-CAPILLARY: 127 mg/dL — AB (ref 65–99)
Glucose-Capillary: 112 mg/dL — ABNORMAL HIGH (ref 65–99)
Glucose-Capillary: 124 mg/dL — ABNORMAL HIGH (ref 65–99)
Glucose-Capillary: 139 mg/dL — ABNORMAL HIGH (ref 65–99)
Glucose-Capillary: 129 mg/dL — ABNORMAL HIGH (ref 65–99)

## 2016-05-02 LAB — HEMOGLOBIN AND HEMATOCRIT, BLOOD
HCT: 23.6 % — ABNORMAL LOW (ref 36.0–46.0)
Hemoglobin: 7.8 g/dL — ABNORMAL LOW (ref 12.0–15.0)

## 2016-05-02 LAB — HIV ANTIBODY (ROUTINE TESTING W REFLEX): HIV Screen 4th Generation wRfx: NONREACTIVE

## 2016-05-02 LAB — PREPARE RBC (CROSSMATCH)

## 2016-05-02 MED ORDER — ACETAMINOPHEN 325 MG PO TABS
650.0000 mg | ORAL_TABLET | ORAL | Status: DC | PRN
  Administered 2016-05-02 – 2016-05-19 (×6): 650 mg via ORAL
  Administered 2016-05-21: 325 mg via ORAL
  Administered 2016-05-26 – 2016-05-28 (×2): 650 mg via ORAL
  Filled 2016-05-02 (×7): qty 2

## 2016-05-02 MED ORDER — DARBEPOETIN ALFA 60 MCG/0.3ML IJ SOSY
60.0000 ug | PREFILLED_SYRINGE | Freq: Once | INTRAMUSCULAR | Status: AC
  Administered 2016-05-02: 60 ug via SUBCUTANEOUS
  Filled 2016-05-02: qty 0.3

## 2016-05-02 MED ORDER — FUROSEMIDE 10 MG/ML IJ SOLN
160.0000 mg | Freq: Four times a day (QID) | INTRAVENOUS | Status: DC
  Administered 2016-05-02 – 2016-05-07 (×19): 160 mg via INTRAVENOUS
  Filled 2016-05-02 (×9): qty 16
  Filled 2016-05-02: qty 10
  Filled 2016-05-02: qty 2
  Filled 2016-05-02 (×3): qty 16
  Filled 2016-05-02: qty 2
  Filled 2016-05-02 (×5): qty 16
  Filled 2016-05-02: qty 10
  Filled 2016-05-02: qty 16
  Filled 2016-05-02: qty 10
  Filled 2016-05-02 (×2): qty 16

## 2016-05-02 MED ORDER — PREMIER PROTEIN SHAKE
11.0000 [oz_av] | Freq: Three times a day (TID) | ORAL | Status: DC
  Administered 2016-05-02 – 2016-05-03 (×2): 11 [oz_av] via ORAL
  Filled 2016-05-02 (×5): qty 325.31

## 2016-05-02 MED ORDER — SODIUM CHLORIDE 0.9 % IV SOLN
Freq: Once | INTRAVENOUS | Status: AC
  Administered 2016-05-02: 11:00:00 via INTRAVENOUS

## 2016-05-02 MED ORDER — DOXYCYCLINE HYCLATE 100 MG PO TABS
100.0000 mg | ORAL_TABLET | Freq: Two times a day (BID) | ORAL | Status: DC
  Administered 2016-05-02 – 2016-05-13 (×22): 100 mg via ORAL
  Filled 2016-05-02 (×23): qty 1

## 2016-05-02 MED ORDER — ADULT MULTIVITAMIN W/MINERALS CH
1.0000 | ORAL_TABLET | Freq: Every day | ORAL | Status: DC
  Administered 2016-05-02 – 2016-05-07 (×6): 1 via ORAL
  Filled 2016-05-02 (×5): qty 1

## 2016-05-02 MED ORDER — METOLAZONE 5 MG PO TABS
5.0000 mg | ORAL_TABLET | Freq: Two times a day (BID) | ORAL | Status: DC
  Administered 2016-05-02 – 2016-05-03 (×2): 5 mg via ORAL
  Filled 2016-05-02 (×3): qty 1

## 2016-05-02 NOTE — Progress Notes (Addendum)
PROGRESS NOTE    [name]   ZOX:[id number]  DOB: 03-Oct-1975  DOA: 05/01/2016 PCP: Marletta Lor, NP   Brief Narrative:  [name] is a 41 y.o. female with medical history significant of morbid obesity, chronic kidney disease stage IV-V, diabetes mellitus, hypertension, prior CVA, diastolic CHF, presents to the emergency room with chief complaint of weakness as well as fever.  Patient has been having progressive weakness over the last 1 week, to the point that she is unable to get herself out of bed.  She is quite a poor historian but the husband is at bedside and is able to contribute to the story.  She has been feeling progressively weak, and had difficulty standing in the last 3-4 days and has been bedbound over the last few days.  EMS was called yesterday due to the patient's persistent weakness, and when they arrived she was febrile to 102.  She complains of a cough that has been progressive over the last week, it is "wet sounding" has not been able to bring up much sputum.  She reports compliance with all her medications including her diuretics.  She has no abdominal pain, no nausea, vomiting or diarrhea.  She denies any chest pain.  She complains of mild shortness of breath.  She has a home health nurse that comes for her chronic lower extremity wounds, and her dressings are changed regularly.  Subjective: Minimally responsive this AM. Noted to have pulse ox in 70s. Placed on face mask with improvement in pulse ox to 100 and improvement in mental status- able to awaken and communicate. No complaints.   Assessment & Plan:   Principal Problem:   HCAP (healthcare-associated pneumonia)? Acute hypoxic resp failure with acute encephalopathy - temp of 100.4 - pulse ox in 70s today - WBC 14 > 13 after antibiotics - CXR>> Cardiomegaly with mild bilateral interstitial prominence consistent with CHF - check chest CT w/o contrast as CXR is likely not a good study due to her  weight - currently on Zosyn- will continue - ADDENDUM: CT negative for pneumonia- cough and low grade fever possibly due to bronchitis- change Vanc/Zosyn to Doxy  Active Problems: Lethargy - due to hypoxia this AM- ABG did not show CO2 retention    Acute kidney injury superimposed on CKD 4   h/o chronic dCHF - due to body habitus, it is difficult to tell if she is dehydrated vs fluid overloaded - weight is actually down from last month when she was admitted for acute CHF - nephro eval is pending    Morbid obesity Body mass index is 51.33 kg/m.     Essential hypertension - Norvasc, Coreg, Hydralazine, Imdur    History of CVA   - need for aspirin or statin?     Anemia of chronic disease - Hb dropped from 8 to 6 which was checked again and found to be 6- suspect 8 was innacurate - giving 2 U PRBC - suspect Anemia related to CKD- last anemia panel in Feb showed no Iron deficiency - check Folate and B12 as it was not checked with recent anemia panel  - will give Aranesp  Type 2 diabetes mellitus with complication, with long-term current use of insulin  - A1c 5.2 in 1/18    Debility - PT eval      DVT prophylaxis: Heparin Code Status: Full code Family Communication:  Disposition Plan: follow on med/surg Consultants:   none Procedures:   none Antimicrobials:  Anti-infectives  Start     Dose/Rate Route Frequency Ordered Stop   05/01/16 2200  piperacillin-tazobactam (ZOSYN) IVPB 2.25 g     2.25 g 100 mL/hr over 30 Minutes Intravenous Every 8 hours 05/01/16 1324     05/01/16 1330  vancomycin (VANCOCIN) 2,500 mg in sodium chloride 0.9 % 500 mL IVPB     2,500 mg 250 mL/hr over 120 Minutes Intravenous  Once 05/01/16 1309 05/01/16 1551   05/01/16 1300  piperacillin-tazobactam (ZOSYN) IVPB 3.375 g     3.375 g 100 mL/hr over 30 Minutes Intravenous  Once 05/01/16 1258 05/01/16 1340   05/01/16 1300  vancomycin (VANCOCIN) IVPB 1000 mg/200 mL premix  Status:  Discontinued      1,000 mg 200 mL/hr over 60 Minutes Intravenous  Once 05/01/16 1258 05/01/16 1308       Objective: Vitals:   05/02/16 0958 05/02/16 1001 05/02/16 1100 05/02/16 1141  BP:  (!) 145/82 (!) 141/77 (!) 145/75  Pulse:  93 93 94  Resp:  20 20 20   Temp:   100 F (37.8 C) (!) 100.4 F (38 C)  TempSrc:   Oral Oral  SpO2: (!) 74% 100% 95% 96%  Weight:        Intake/Output Summary (Last 24 hours) at 05/02/16 1245 Last data filed at 05/02/16 1141  Gross per 24 hour  Intake              630 ml  Output              400 ml  Net              230 ml   Filed Weights   05/01/16 1156  Weight: (!) 144.2 kg (318 lb)    Examination: General exam: Appears comfortable - somnolent HEENT: PERRLA, oral mucosa moist, no sclera icterus or thrush Respiratory system: Clear to auscultation. Respiratory effort normal. Pulse ox in 70s on room  Air.  Cardiovascular system: S1 & S2 heard, RRR.  No murmurs  Gastrointestinal system: Abdomen soft, non-tender, nondistended. Normal bowel sound. No organomegaly Central nervous system:  Somnolent- not following commands Extremities: No cyanosis, clubbing + edema LE Skin: wounds on inner thighs, thickened dark skin on LE Psychiatry:  Mood & affect appropriate.     Data Reviewed: I have personally reviewed following labs and imaging studies  CBC:  Recent Labs Lab 05/01/16 1218 05/02/16 0555 05/02/16 0830  WBC 14.0* 13.1* 13.4*  NEUTROABS 11.0*  --   --   HGB 8.3* 6.6* 6.8*  HCT 25.2* 19.2* 20.8*  MCV 83.7 83.8 82.9  PLT 428* 498* 475*   Basic Metabolic Panel:  Recent Labs Lab 05/01/16 1218 05/02/16 0555  NA 140 140  K 3.5 3.9  CL 98* 99*  CO2 23 23  GLUCOSE 102* 120*  BUN 98* 108*  CREATININE 7.96* 7.90*  CALCIUM 8.7* 8.4*   GFR: Estimated Creatinine Clearance: 14.1 mL/min (A) (by C-G formula based on SCr of 7.9 mg/dL (H)). Liver Function Tests:  Recent Labs Lab 05/01/16 1218 05/02/16 0555  AST 9* 10*  ALT 8* 7*  ALKPHOS 65  60  BILITOT 0.9 1.0  PROT 7.9 7.1  ALBUMIN 2.4* 2.2*   No results for input(s): LIPASE, AMYLASE in the last 168 hours. No results for input(s): AMMONIA in the last 168 hours. Coagulation Profile: No results for input(s): INR, PROTIME in the last 168 hours. Cardiac Enzymes: No results for input(s): CKTOTAL, CKMB, CKMBINDEX, TROPONINI in the last 168 hours. BNP (last  3 results) No results for input(s): PROBNP in the last 8760 hours. HbA1C: No results for input(s): HGBA1C in the last 72 hours. CBG:  Recent Labs Lab 05/01/16 1737 05/01/16 2059 05/02/16 0722 05/02/16 1142  GLUCAP 109* 112* 127* 124*   Lipid Profile: No results for input(s): CHOL, HDL, LDLCALC, TRIG, CHOLHDL, LDLDIRECT in the last 72 hours. Thyroid Function Tests: No results for input(s): TSH, T4TOTAL, FREET4, T3FREE, THYROIDAB in the last 72 hours. Anemia Panel: No results for input(s): VITAMINB12, FOLATE, FERRITIN, TIBC, IRON, RETICCTPCT in the last 72 hours. Urine analysis:    Component Value Date/Time   COLORURINE STRAW (A) 05/01/2016 1144   APPEARANCEUR CLEAR 05/01/2016 1144   LABSPEC 1.009 05/01/2016 1144   PHURINE 5.0 05/01/2016 1144   GLUCOSEU NEGATIVE 05/01/2016 1144   HGBUR SMALL (A) 05/01/2016 1144   BILIRUBINUR NEGATIVE 05/01/2016 1144   KETONESUR NEGATIVE 05/01/2016 1144   PROTEINUR 30 (A) 05/01/2016 1144   UROBILINOGEN 0.2 10/26/2013 1721   NITRITE NEGATIVE 05/01/2016 1144   LEUKOCYTESUR NEGATIVE 05/01/2016 1144   Sepsis Labs: @LABRCNTIP (procalcitonin:4,lacticidven:4) ) Recent Results (from the past 240 hour(s))  Urine culture     Status: None   Collection Time: 05/01/16 11:44 AM  Result Value Ref Range Status   Specimen Description URINE, RANDOM  Final   Special Requests NONE  Final   Culture   Final    NO GROWTH Performed at [facility], [address], [place], [place] [address]    Report Status 05/02/2016 FINAL  Final  Blood Culture (routine x 2)     Status: None  (Preliminary result)   Collection Time: 05/01/16 12:20 PM  Result Value Ref Range Status   Specimen Description BLOOD LEFT ANTECUBITAL  Final   Special Requests BOTTLES DRAWN AEROBIC AND ANAEROBIC 5CC  Final   Culture   Final    NO GROWTH < 24 HOURS Performed at [facility], [address] [place], [place] [address]    Report Status PENDING  Incomplete  Blood Culture (routine x 2)     Status: None (Preliminary result)   Collection Time: 05/01/16 12:26 PM  Result Value Ref Range Status   Specimen Description BLOOD LEFT HAND  Final   Special Requests BOTTLES DRAWN AEROBIC AND ANAEROBIC 5CC  Final   Culture   Final    NO GROWTH < 24 HOURS Performed at [facility], [address], [place], [place] [address]    Report Status PENDING  Incomplete         Radiology Studies: Dg Chest 2 View  Result Date: 05/01/2016 CLINICAL DATA:  Fever and weakness. EXAM: CHEST  2 VIEW COMPARISON:  03/22/2016. FINDINGS: Cardiomegaly with mild bilateral interstitial prominence. No pleural effusion or pneumothorax. No acute bony abnormality. IMPRESSION: Cardiomegaly with mild bilateral interstitial prominence consistent with CHF. Findings have improved from prior study of 03/22/2016. Given patient's history of fever pneumonitis cannot be excluded. Electronically Signed   By: Maisie Fus  Register   On: 05/01/2016 13:30      Scheduled Meds: . sodium chloride   Intravenous Once  . amLODipine  10 mg Oral Daily  . atorvastatin  20 mg Oral Daily  . carvedilol  25 mg Oral BID WC  . ferrous sulfate  325 mg Oral TID WC  . heparin  5,000 Units Subcutaneous Q8H  . hydrALAZINE  100 mg Oral TID  . insulin aspart  0-5 Units Subcutaneous QHS  . insulin aspart  0-9 Units Subcutaneous TID WC  .  isosorbide mononitrate  30 mg Oral Daily  . piperacillin-tazobactam (ZOSYN)  IV  2.25 g Intravenous Q8H   Continuous Infusions:   LOS: 1 day    Time spent in minutes: 45    Linda Suazo, MD [facility] Pager: [email or web address] Password [email or web address] 05/02/2016, 12:45 PM

## 2016-05-02 NOTE — Progress Notes (Addendum)
Initial Nutrition Assessment  DOCUMENTATION CODES:   Morbid obesity  INTERVENTION:   Premier Protein TID, each supplement provides 160kcal and 30g protein.   MVI  Recommend check phosphorus levels since phosphorus was elevated in February.    NUTRITION DIAGNOSIS:   Inadequate oral intake related to acute illness, poor appetite as evidenced by meal completion < 25%.  GOAL:   Patient will meet greater than or equal to 90% of their needs  MONITOR:   PO intake, Supplement acceptance, Labs, Skin  REASON FOR ASSESSMENT:   Consult Diet education  ASSESSMENT:   41 y.o. female with medical history significant of morbid obesity, chronic kidney disease stage IV-V, diabetes mellitus, hypertension, prior CVA, diastolic CHF, presents to the emergency room with chief complaint of weakness as well as fever. Patient found to have HCAP   Met with pt in room today. Pt intermittently opening eyes and answering some questions but seems fairly unresponsive. Per MD note, pt is a poor historian. Pt reports poor appetite for 4 days pta. Pt currently refusing to eat meals in hospital. Per chart, pt has lost 65lbs(17%) in four months. RD is unsure if pt has had any true weight loss r/t history of fluid fluctuations as pt with CHF and moderate to severe edema. RD suspects that this pt is malnourished r/t recent poor appetite, wt loss, and pt with multiple severe illnessess. Pt will not be able to meet estimated protein needs on house diet r/t morbid obesity. RD will order Premier Protein TID for this pt; continue to encourage po intake. Last phosphorus checked on this pt was elevated in Feb. 2018; recommend rechecking this admit. Pt in wheelchair at baseline.   Pt not appropriate for education at this time. RD will continue to follow for nutritional needs.     Medications reviewed and include: ferrous sulfate, heparin, insulin, zosyn, hydrocodone, zofran  Labs reviewed: Cl 99(L), BUN 108(H), creat  7.9(H), Ca 8.4(L) adj. 9.84 wnl, alb 2.2(L) Wbc- 13.4(H), Hgb 6.8(L), Hct 20.8(L)  Nutrition-Focused physical exam completed. Findings are no fat depletion, moderate muscle depletion in temporal regions, and moderate to severe edema.   Diet Order:  Diet heart healthy/carb modified Room service appropriate? Yes; Fluid consistency: Thin  Skin:  Wound (see comment) (Stage II wound thigh)  Last BM:  3/21  Height:   Ht Readings from Last 1 Encounters:  03/22/16 '5\' 6"'$  (1.676 m)    Weight:   Wt Readings from Last 1 Encounters:  05/01/16 (!) 318 lb (144.2 kg)    Ideal Body Weight:  59 kg  BMI:  Body mass index is 51.33 kg/m.  Estimated Nutritional Needs:   Kcal:  2200-2400kcal/day   Protein:  116-145g/day   Fluid:  >2.2L/day   EDUCATION NEEDS:   Education needs no appropriate at this time  Koleen Distance, RD, [place] Pager #[phone]

## 2016-05-02 NOTE — Progress Notes (Signed)
PHARMACY - PHYSICIAN COMMUNICATION CRITICAL VALUE ALERT - BLOOD CULTURE IDENTIFICATION (BCID)  Results for orders placed or performed during the hospital encounter of 05/01/16  Blood Culture ID Panel (Reflexed) (Collected: 05/01/2016 12:20 PM)  Result Value Ref Range   Enterococcus species NOT DETECTED NOT DETECTED   Listeria monocytogenes NOT DETECTED NOT DETECTED   Staphylococcus species NOT DETECTED NOT DETECTED   Staphylococcus aureus NOT DETECTED NOT DETECTED   Streptococcus species NOT DETECTED NOT DETECTED   Streptococcus agalactiae NOT DETECTED NOT DETECTED   Streptococcus pneumoniae NOT DETECTED NOT DETECTED   Streptococcus pyogenes NOT DETECTED NOT DETECTED   Acinetobacter baumannii NOT DETECTED NOT DETECTED   Enterobacteriaceae species NOT DETECTED NOT DETECTED   Enterobacter cloacae complex NOT DETECTED NOT DETECTED   Escherichia coli NOT DETECTED NOT DETECTED   Klebsiella oxytoca NOT DETECTED NOT DETECTED   Klebsiella pneumoniae NOT DETECTED NOT DETECTED   Proteus species NOT DETECTED NOT DETECTED   Serratia marcescens NOT DETECTED NOT DETECTED   Haemophilus influenzae NOT DETECTED NOT DETECTED   Neisseria meningitidis NOT DETECTED NOT DETECTED   Pseudomonas aeruginosa NOT DETECTED NOT DETECTED   Candida albicans NOT DETECTED NOT DETECTED   Candida glabrata NOT DETECTED NOT DETECTED   Candida krusei NOT DETECTED NOT DETECTED   Candida parapsilosis NOT DETECTED NOT DETECTED   Candida tropicalis NOT DETECTED NOT DETECTED   BCID positive for GPC in 1/2 bottles, No organism identified  Name of physician (or Provider) Contacted: Rizwan  Changes to prescribed antibiotics required: no  Otho BellowsGreen, Soo Steelman L PharmD Pager [phone] 05/02/2016, 6:54 PM

## 2016-05-02 NOTE — Consult Note (Signed)
WOC Nurse wound consult note Reason for Consult: bilateral inner thigh wounds Wound type:full thickness Pressure Injury POA: N/A, related to moisture and friction from thighs rubbing together Measurement:Left inner thigh has large area with many open areas scattered throughout with circle of red granulating wounds around perimeter and a larger area at the proximal end of wound. Entire wound is 17cm x 12cm x 0.1cm 50% red granulating tissue, 30 % pale white wound bed and 20% blackened calloused skin scattered throughout. Right inner thigh wound is 8cm x 5cm x 0.3cm full thickness wound with 90%  pink granulating tissue and 10% yellow slough. Both wounds have moderate amt of serosanguinous drainage and are malodorous.  Wound bed:see above Drainage (amount, consistency, odor) see above Periwound: intact, darkened calloused skin Dressing procedure/placement/frequency: I have provided nurses with orders as follows: Cleanse with NS, gently pat dry, apply Xeroform gauze, top with dry gauze,change every shift. Pt is already on a low air loss mattress. We will not follow, but will remain available to this patient, to nursing, and the medical and/or surgical teams.  Please re-consult if we need to assist further.    Barnett HatterMelinda Laytoya Ion, RN-C, WTA-C Wound Treatment Associate

## 2016-05-02 NOTE — Progress Notes (Signed)
PT Cancellation Note  Patient Details Name: Linda DykesDavenia McKinnon [name] MRN: [id number] DOB: 07/28/1975   Cancelled Treatment:    Reason Eval/Treat Not Completed: Medical issues which prohibited therapy--hgb 6.6-pt awaiting transfusion. Will hold PT at this time. Will check back once appropriate/when schedule allows.  Thanks.    Rebeca AlertJannie Jolicia Delira, MPT Pager: [phone]

## 2016-05-02 NOTE — Consult Note (Addendum)
Renal Service Consult Note [name]  Linda DykesDavenia [name] [name] 05/02/2016 [name] Requesting Physician:  Dr Butler Denmarkizwan  Reason for Consult: Worsening renal failure HPI: The patient is a 41 y.o. year-old with progressive renal failure, presenting with progressive weakness, difficulty standing, fevers. Also has had cough progressively worse x 1 week, no purulent sputum.  Admitted last night. Getting prbc's x 2 today.  Breathing a little worse now.  NO fevers.  Was here in Nov 2017 w creat 4-5, in Jan '18 with creat 4.5- 5.0, in Feb '18 with creat 5.0- 5.3 and now creat is 7.90.  [name] has been seen in our office by Dr Marisue HumbleSanford, her last visit with April 2017, has missed visits since then.    Says her swelling is worse in legs and arms.  No chest pain.  S[name]  Uses a wheelchair and [facility].  Gets onto the "bus" to get to appts.    ROS  denies CP  no joint pain   no HA  no blurry vision  no rash  no diarrhea  no nausea/ vomiting  no dysuria  no difficulty voiding  no change in urine color    Past Medical History  Past Medical History:  Diagnosis Date  . Asthma   . CHF (congestive heart failure) (HCC)    diastolic  . CVA (cerebral infarction) 1990's   "writing is not the same since" R leg weakness   . GERD (gastroesophageal reflux disease)   . Hypertension   . Morbid obesity (HCC)   . Stroke (HCC)   . Type II diabetes mellitus (HCC)    Past Surgical History  Past Surgical History:  Procedure Laterality Date  . INCISION AND DRAINAGE OF WOUND Left 08/30/2013   thigh necrotic wound/notes 08/30/2013  . IRRIGATION AND DEBRIDEMENT ABSCESS Left 08/30/2013   Procedure: IRRIGATION AND DEBRIDEMENT ABSCESS Left Thigh Necrotic Wound;  Surgeon: Axel FillerArmando Ramirez, MD;  Location: [facility] OR;  Service: General;  Laterality: Left;  . TEE WITHOUT CARDIOVERSION N/A 09/07/2013   Procedure: TRANSESOPHAGEAL ECHOCARDIOGRAM  (TEE);  Surgeon: Chrystie NoseKenneth C. Hilty, MD;  [facility];  Service: Cardiovascular;  Laterality: N/A;  . TEE WITHOUT CARDIOVERSION N/A 02/08/2015   Procedure: TRANSESOPHAGEAL ECHOCARDIOGRAM (TEE);  Surgeon: Chilton Siiffany Taylors, MD;  [facility];  Service: Cardiovascular;  Laterality: N/A;   Family History  Family History  Problem Relation Age of Onset  . Diabetes Mother   . Kidney disease Mother    Social History  reports that [name] has never smoked. [name] has never used smokeless tobacco. [name] reports that [name] does not drink alcohol or use drugs. Allergies  Allergies  Allergen Reactions  . Azithromycin Other (See Comments)    Nose bleeding event   Home medications Prior to Admission medications   Medication Sig Start Date End Date Taking? Authorizing Provider  albuterol (PROAIR HFA) 108 (90 Base) MCG/ACT inhaler Inhale 2 puffs into the lungs every 6 (six) hours as needed for wheezing or shortness of breath. 08/12/15  Yes Charm RingsErin J Honig, MD  albuterol (PROVENTIL) (2.5 MG/3ML) 0.083% nebulizer solution Take 3 mLs (2.5 mg total) by nebulization every 6 (six) hours as needed for wheezing or shortness of breath. 08/12/15  Yes Charm RingsErin J Honig, MD  amLODipine (NORVASC) 10 MG tablet Take 1 tablet (10 mg total) by mouth daily. 01/28/16  Yes Jeffrey Hedges, PA-C  atorvastatin (LIPITOR) 20 MG tablet Take 20 mg by mouth  daily.   Yes Historical Provider, MD  carvedilol (COREG) 25 MG tablet Take 1 tablet (25 mg total) by mouth 2 (two) times daily with a meal. 01/28/16  Yes Eyvonne Mechanic, PA-C  ferrous sulfate 325 (65 FE) MG tablet Take 1 tablet (325 mg total) by mouth 3 (three) times daily with meals. 01/28/16  Yes Jeffrey Hedges, PA-C  furosemide (LASIX) 80 MG tablet Take 2 tablets (160 mg total) by mouth every 6 (six) hours. 03/30/16  Yes Dron Jaynie Collins, MD  hydrALAZINE (APRESOLINE) 50 MG tablet Take 2 tablets (100 mg total) by mouth 3 (three) times daily. 03/30/16  Yes Dron Jaynie Collins, MD   insulin aspart (NOVOLOG) 100 UNIT/ML injection Inject 0-20 Units into the skin 3 (three) times daily with meals. CBG < 70: implement hypoglycemia protocol CBG 70 - 120: 0 units CBG 121 - 150: 3 units CBG 151 - 200: 4 units CBG 201 - 250: 7 units CBG 251 - 300: 11 units CBG 301 - 350: 15 units CBG 351 - 400: 20 units 01/28/16  Yes Jeffrey Hedges, PA-C  isosorbide mononitrate (IMDUR) 30 MG 24 hr tablet Take 1 tablet (30 mg total) by mouth daily. 01/28/16  Yes Jeffrey Hedges, PA-C  metolazone (ZAROXOLYN) 10 MG tablet Take 1 tablet (10 mg total) by mouth daily. 03/31/16  Yes Dron Jaynie Collins, MD  polyethylene glycol powder (GLYCOLAX/MIRALAX) powder Dissolve 1 capful into 4 to 8 oz of fluid 04/04/16  Yes Historical Provider, MD  potassium chloride SA (K-DUR,KLOR-CON) 20 MEQ tablet Take 2 tablets (40 mEq total) by mouth daily. 03/31/16  Yes Dron Jaynie Collins, MD  vitamin A 8000 UNIT capsule Take 8,000 Units by mouth daily.   Yes Historical Provider, MD  camphor-menthol [facility]) lotion Apply 1 application topically every 8 (eight) hours as needed for itching. 03/30/16   Dron Jaynie Collins, MD   Liver Function Tests  Recent Labs Lab 05/01/16 1218 05/02/16 0555  AST 9* 10*  ALT 8* 7*  ALKPHOS 65 60  BILITOT 0.9 1.0  PROT 7.9 7.1  ALBUMIN 2.4* 2.2*   No results for input(s): LIPASE, AMYLASE in the last 168 hours. CBC  Recent Labs Lab 05/01/16 1218 05/02/16 0555 05/02/16 0830  WBC 14.0* 13.1* 13.4*  NEUTROABS 11.0*  --   --   HGB 8.3* 6.6* 6.8*  HCT 25.2* 19.2* 20.8*  MCV 83.7 83.8 82.9  PLT 428* 498* 475*   Basic Metabolic Panel  Recent Labs Lab 05/01/16 1218 05/02/16 0555  NA 140 140  K 3.5 3.9  CL 98* 99*  CO2 23 23  GLUCOSE 102* 120*  BUN 98* 108*  CREATININE 7.96* 7.90*  CALCIUM 8.7* 8.4*   Iron/TIBC/Ferritin/ %Sat    Component Value Date/Time   IRON 74 03/22/2016 1835   TIBC 165 (L) 03/22/2016 1835   FERRITIN 457 (H) 03/22/2016 1835   IRONPCTSAT 45 (H)  03/22/2016 1835    Vitals:   05/02/16 1141 05/02/16 1327 05/02/16 1357 05/02/16 1603  BP: (!) 145/75 137/72  133/67  Pulse: 94 89 88 88  Resp: 20 20 20 20   Temp: (!) 100.4 F (38 C) 99.8 F (37.7 C) 99.5 F (37.5 C) 99.6 F (37.6 C)  TempSrc: Oral Oral Oral Oral  SpO2: 96% 94% 100% 98%  Weight:       Exam Gen morbidly obese, mild resp difficulty, not in distress No rash, cyanosis or gangrene Sclera anicteric, throat clear  No jvd or bruits Chest coarse bilat rhonchi, no wheezing RRR  no MRG Abd soft ntnd no mass or ascites +bs GU foley w/o much urine in the bag MS no joint effusions or deformity Ext diffuse 2+ pitting edema of arms and lower legs Neuro is alert, Ox 3 , nf, no asterixis    Assessment: 1. 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  Not overtly uremic, no n/v yet. 2. Vol overload - pulm edema on CXR, sig body edema.  3. SOB - due to #2 most likely 4. Anemia - of CKD +/- other, getting prbc's now 5. Morbid obesity 6. Debility - not sure what her mobility is in OP setting 7. Hist CVA  Plan - high dose IV lasix + metolazone, get volume down, fluid restriction.  CKD is progressing and question of dialysis is raised.  Not an acute indication.  Not sure if [name] would be candidate however for dialysis locally, will need to get current assessment of her mobility and also will need to get any restrictions on size/ weight that OP HD centers may or may not have.  PT eval when volume is improved.    Vinson Moselle MD [employer] pager [phone]   05/02/2016, 4:11 PM

## 2016-05-03 DIAGNOSIS — N184 Chronic kidney disease, stage 4 (severe): Secondary | ICD-10-CM

## 2016-05-03 DIAGNOSIS — E8779 Other fluid overload: Secondary | ICD-10-CM

## 2016-05-03 DIAGNOSIS — N189 Chronic kidney disease, unspecified: Secondary | ICD-10-CM

## 2016-05-03 LAB — RENAL FUNCTION PANEL
Albumin: 2.1 g/dL — ABNORMAL LOW (ref 3.5–5.0)
Anion gap: 14 (ref 5–15)
BUN: 117 mg/dL — AB (ref 6–20)
CO2: 24 mmol/L (ref 22–32)
Calcium: 8.2 mg/dL — ABNORMAL LOW (ref 8.9–10.3)
Chloride: 100 mmol/L — ABNORMAL LOW (ref 101–111)
Creatinine, Ser: 7.65 mg/dL — ABNORMAL HIGH (ref 0.44–1.00)
GFR calc Af Amer: 7 mL/min — ABNORMAL LOW (ref >60)
GFR calc non Af Amer: 6 mL/min — ABNORMAL LOW (ref >60)
GLUCOSE: 140 mg/dL — AB (ref 65–99)
Phosphorus: 8.8 mg/dL — ABNORMAL HIGH (ref 2.5–4.6)
Potassium: 3.2 mmol/L — ABNORMAL LOW (ref 3.5–5.1)
SODIUM: 138 mmol/L (ref 135–145)

## 2016-05-03 LAB — BPAM RBC
BLOOD PRODUCT EXPIRATION DATE: 201804152359
Blood Product Expiration Date: 201804162359
ISSUE DATE / TIME: 201803221050
ISSUE DATE / TIME: 201803221331
Unit Type and Rh: 6200
Unit Type and Rh: 6200

## 2016-05-03 LAB — TYPE AND SCREEN
ABO/RH(D): A POS
ANTIBODY SCREEN: NEGATIVE
Unit division: 0
Unit division: 0

## 2016-05-03 LAB — CREATININE, SERUM
CREATININE: 7.84 mg/dL — AB (ref 0.44–1.00)
GFR, EST AFRICAN AMERICAN: 7 mL/min — AB (ref >60)
GFR, EST NON AFRICAN AMERICAN: 6 mL/min — AB (ref >60)

## 2016-05-03 LAB — CBC
HCT: 24.6 % — ABNORMAL LOW (ref 36.0–46.0)
Hemoglobin: 8.2 g/dL — ABNORMAL LOW (ref 12.0–15.0)
MCH: 28.1 pg (ref 26.0–34.0)
MCHC: 33.3 g/dL (ref 30.0–36.0)
MCV: 84.2 fL (ref 78.0–100.0)
Platelets: 455 10*3/uL — ABNORMAL HIGH (ref 150–400)
RBC: 2.92 MIL/uL — ABNORMAL LOW (ref 3.87–5.11)
RDW: 14.2 % (ref 11.5–15.5)
WBC: 12.2 10*3/uL — ABNORMAL HIGH (ref 4.0–10.5)

## 2016-05-03 LAB — GLUCOSE, CAPILLARY
GLUCOSE-CAPILLARY: 116 mg/dL — AB (ref 65–99)
GLUCOSE-CAPILLARY: 123 mg/dL — AB (ref 65–99)
GLUCOSE-CAPILLARY: 126 mg/dL — AB (ref 65–99)
GLUCOSE-CAPILLARY: 123 mg/dL — AB (ref 65–99)

## 2016-05-03 LAB — VITAMIN B12: VITAMIN B 12: 222 pg/mL (ref 180–914)

## 2016-05-03 LAB — VANCOMYCIN, RANDOM: VANCOMYCIN RM: 25

## 2016-05-03 MED ORDER — HYDROCODONE-ACETAMINOPHEN 10-325 MG PO TABS
1.0000 | ORAL_TABLET | Freq: Four times a day (QID) | ORAL | Status: DC | PRN
  Administered 2016-05-04 – 2016-05-20 (×21): 1 via ORAL
  Filled 2016-05-03 (×22): qty 1

## 2016-05-03 MED ORDER — CYANOCOBALAMIN 1000 MCG/ML IJ SOLN
1000.0000 ug | Freq: Every day | INTRAMUSCULAR | Status: AC
  Administered 2016-05-03 – 2016-05-05 (×3): 1000 ug via SUBCUTANEOUS
  Filled 2016-05-03 (×3): qty 1

## 2016-05-03 MED ORDER — METOLAZONE 10 MG PO TABS
10.0000 mg | ORAL_TABLET | Freq: Two times a day (BID) | ORAL | Status: DC
  Administered 2016-05-03 – 2016-05-07 (×8): 10 mg via ORAL
  Filled 2016-05-03 (×10): qty 1

## 2016-05-03 MED ORDER — POTASSIUM CHLORIDE CRYS ER 20 MEQ PO TBCR
40.0000 meq | EXTENDED_RELEASE_TABLET | Freq: Once | ORAL | Status: AC
  Administered 2016-05-03: 40 meq via ORAL
  Filled 2016-05-03: qty 2

## 2016-05-03 MED ORDER — PRO-STAT SUGAR FREE PO LIQD
30.0000 mL | Freq: Two times a day (BID) | ORAL | Status: DC
  Filled 2016-05-03: qty 30

## 2016-05-03 MED ORDER — HYDROMORPHONE HCL 1 MG/ML IJ SOLN
1.0000 mg | Freq: Once | INTRAMUSCULAR | Status: AC
  Administered 2016-05-03: 1 mg via INTRAVENOUS
  Filled 2016-05-03: qty 1

## 2016-05-03 MED ORDER — ONDANSETRON HCL 4 MG/2ML IJ SOLN
4.0000 mg | Freq: Once | INTRAMUSCULAR | Status: AC
  Administered 2016-05-03: 4 mg via INTRAVENOUS

## 2016-05-03 MED ORDER — HYDROCODONE-ACETAMINOPHEN 7.5-325 MG PO TABS
2.0000 | ORAL_TABLET | Freq: Once | ORAL | Status: AC
  Administered 2016-05-03: 2 via ORAL
  Filled 2016-05-03: qty 2

## 2016-05-03 NOTE — Evaluation (Signed)
Physical Therapy Evaluation Patient Details Name: [name] MRN: [id number] DOB: 05-30-1975 Today's Date: 05/03/2016   History of Present Illness  41 y.o. female with medical history significant of morbid obesity, chronic kidney disease stage IV-V, diabetes mellitus, hypertension, prior CVA, diastolic CHF, presents to the emergency room with chief complaint of weakness as well as fever and diagnosed with acute hypoxic resp failure with acute encephalopathy  Clinical Impression  Pt admitted with above diagnosis. Pt currently with functional limitations due to the deficits listed below (see PT Problem List).  Pt will benefit from skilled PT to increase their independence and safety with mobility to allow discharge to the venue listed below.  Pt requiring increased assist for bed mobility at this time.  Difficulty remaining EOB due to sliding surface.  Per pt and chart, pt was able to transfer to w/c at home prior to admission.  Recommend SNF at this time.     Follow Up Recommendations SNF;Supervision/Assistance - 24 hour    Equipment Recommendations  Hospital bed    Recommendations for Other Services       Precautions / Restrictions Precautions Precautions: Fall Precaution Comments: bil inner thigh wounds Restrictions Weight Bearing Restrictions: No      Mobility  Bed Mobility Overal bed mobility: Needs Assistance Bed Mobility: Supine to Sit;Sit to Supine     Supine to sit: Max assist;HOB elevated;+2 for physical assistance Sit to supine: +2 for physical assistance;Total assist   General bed mobility comments: pt attempting to assist with getting to EOB, cues for self assist however pt unable to move lower body, upon scooting to EOB, pt began sliding (bari air mattress surface) so therapist lifted LEs and pt layed trunk upon bed to prevent sliding farther   Transfers                    Ambulation/Gait                Stairs             Wheelchair Mobility    Modified Rankin (Stroke Patients Only)       Balance Overall balance assessment: Needs assistance Sitting-balance support: Single extremity supported;Feet unsupported Sitting balance-Leahy Scale: Zero                                       Pertinent Vitals/Pain Pain Assessment: Faces Faces Pain Scale: Hurts whole lot Pain Location: LEs Pain Descriptors / Indicators: Sore;Grimacing Pain Intervention(s): Limited activity within patient's tolerance;Repositioned;Monitored during session    Home Living Family/patient expects to be discharged to:: Private residence Living Arrangements: Spouse/significant other Available Help at Discharge: Family;Available 24 hours/day Type of Home: Apartment Home Access: Level entry     Home Layout: One level Home Equipment: Bedside commode;Walker - 2 wheels;Wheelchair - Engineer, technical sales - power;Cane - quad;Shower seat Additional Comments: pt has 3, 4, and 7 yo children    Prior Function Level of Independence: Needs assistance   Gait / Transfers Assistance Needed: has not walked at home since CVA. Walked some in rehab but has been afraid of falling since home and so transfers to w/c or [facility] but otherwise stays in bed (from previous admission in Feb)     Comments: pt remains poor historian at this time, confirms using w/c for mobility at home     Hand Dominance        Extremity/Trunk Assessment  Upper Extremity Assessment Upper Extremity Assessment: RUE deficits/detail;Generalized weakness RUE Deficits / Details: increased R UE edeme especially wrist/hand    Lower Extremity Assessment Lower Extremity Assessment: Generalized weakness;RLE deficits/detail;LLE deficits/detail RLE Deficits / Details: pt unable to perform even 25% AROM against gravity, body habitus limiting PROM LLE Deficits / Details: pt unable to perform even 25% AROM against gravity, body habitus limiting PROM        Communication   Communication:  (not many verbalizations)  Cognition   Behavior During Therapy: WFL for tasks assessed/performed;Flat affect Overall Cognitive Status: No family/caregiver present to determine baseline cognitive functioning                                 General Comments: pt poor historian, little verbalizations, agreeable to tasks      General Comments General comments (skin integrity, edema, etc.): bil medial thigh wounds    Exercises     Assessment/Plan    PT Assessment Patient needs continued PT services  PT Problem List Decreased strength;Decreased mobility;Decreased activity tolerance       PT Treatment Interventions DME instruction;Gait training;Therapeutic activities;Therapeutic exercise;Functional mobility training;Patient/family education;Wheelchair mobility training    PT Goals (Current goals can be found in the Care Plan section)  Acute Rehab PT Goals PT Goal Formulation: With patient Time For Goal Achievement: 05/10/16 Potential to Achieve Goals: Fair    Frequency Min 3X/week   Barriers to discharge        Co-evaluation               End of Session Equipment Utilized During Treatment: Oxygen Activity Tolerance: Patient limited by fatigue;Patient limited by pain Patient left: in bed;with call bell/phone within reach;Other (comment) (student nursing)   PT Visit Diagnosis: Other abnormalities of gait and mobility (R26.89);Muscle weakness (generalized) (M62.81)    Time: [id number] PT Time Calculation (min) (ACUTE ONLY): 20 min   Charges:   PT Evaluation $PT Eval Moderate Complexity: 1 Procedure     PT G Codes:        Zenovia JarredKati Kenyette Gundy, PT, DPT 05/03/2016 Pager: [phone]   Maida SaleLEMYRE,KATHrine E 05/03/2016, 12:27 PM Zenovia JarredKati Kattia Selley, PT, DPT 05/03/2016 Pager: [phone]

## 2016-05-03 NOTE — Progress Notes (Addendum)
PROGRESS NOTE    [name]   ZOX:[id number]  DOB: 1976-02-09  DOA: 05/01/2016 PCP: Marletta Lor, NP   Brief Narrative:  [name] is a 41 y.o. female with medical history significant of morbid obesity, chronic kidney disease stage IV-V, diabetes mellitus, hypertension, prior CVA, diastolic CHF, presents to the emergency room with chief complaint of weakness as well as fever.  Patient has been having progressive weakness over the last 1 week, to the point that she is unable to get herself out of bed.  She is quite a poor historian but the husband is at bedside and is able to contribute to the story.  She has been feeling progressively weak, and had difficulty standing in the last 3-4 days and has been bedbound over the last few days.  EMS was called yesterday due to the patient's persistent weakness, and when they arrived she was febrile to 102.  She complains of a cough that has been progressive over the last week, it is "wet sounding" has not been able to bring up much sputum.  She reports compliance with all her medications including her diuretics.  She has no abdominal pain, no nausea, vomiting or diarrhea.  She denies any chest pain.  She complains of mild shortness of breath.  She has a home health nurse that comes for her chronic lower extremity wounds, and her dressings are changed regularly.  Subjective: Still has cough which is unchanged from yesterday. No dyspnea at rest. Would like to go home from the hospital rather than go to a SNF on discharge.   Assessment & Plan:   Principal Problem: Acute hypoxic resp failure with acute encephalopathy - Lethargy due to hypoxia this on 3/22- pulse ox was in 70s and improved with O2- ABG did not show CO2 retention - temp of 100.4 yesterday - WBC 14 > 12 after antibiotics - CXR>> Cardiomegaly with mild bilateral interstitial prominence consistent with CHF - CT negative (w/o contrast due to AKI) for pneumonia or pulm  edema- cough and low grade fever possibly due to bronchitis- changed Vanc/Zosyn to Doxy yesterday- no recurrence of fever  Active Problems:     Acute kidney injury superimposed on CKD 4   h/o chronic grade 2 dCHF - lungs clear on CT and exam but she has edema in extermities - nephro eval completed- Dr Arlean Hopping feels she is fluid overloaded- has been started on IV Lasix and Metolazone - weights appear inaccurate -strict  I and O and fluid restriction ADDENDUM: Called by Dr Arlean Hopping. He would like transfer to [facility] for possible need for dialysis.     Morbid obesity Body mass index is 54.58 kg/m.  When compared to last year's weight in EPIC, she has lost about 100 lbs     Essential hypertension - Norvasc, Coreg, Hydralazine, Imdur    History of CVA   - need for aspirin or statin?     Anemia of chronic disease - Hb dropped from 8 to 6 which was checked again and found to be 6- suspect 8 was innacurate - no acute blood loss in hospital - given 2 U PRBC yesterday - suspect Anemia related to CKD- last anemia panel in Feb showed no Iron deficiency - checking Folate and B12 as it was not checked with recent anemia panel  -  given Aranesp on 3/22 - B12 is low normal- give 3 doses of s/c B12 followed by oral daily  Type 2 diabetes mellitus with complication, with  long-term current use of insulin  - A1c 5.2 in 1/18    Debility - wheelchair bound now for 1 mo - PT eval  Ulcers b/l inner thighs - she states they have been there for 2 wks now- she is not sure how they developed - cont dressing per wound care- patient states her sister does dressing at home daily and an RN checks on them weekly    DVT prophylaxis: Heparin Code Status: Full code Family Communication:  Disposition Plan: transfer to [facility]:   Nephrology Procedures:   none Antimicrobials:  Anti-infectives    Start     Dose/Rate Route Frequency Ordered Stop   05/02/16 2200  doxycycline  (VIBRA-TABS) tablet 100 mg     100 mg Oral Every 12 hours 05/02/16 1732     05/01/16 2200  piperacillin-tazobactam (ZOSYN) IVPB 2.25 g  Status:  Discontinued     2.25 g 100 mL/hr over 30 Minutes Intravenous Every 8 hours 05/01/16 1324 05/02/16 1729   05/01/16 1330  vancomycin (VANCOCIN) 2,500 mg in sodium chloride 0.9 % 500 mL IVPB     2,500 mg 250 mL/hr over 120 Minutes Intravenous  Once 05/01/16 1309 05/01/16 1551   05/01/16 1300  piperacillin-tazobactam (ZOSYN) IVPB 3.375 g     3.375 g 100 mL/hr over 30 Minutes Intravenous  Once 05/01/16 1258 05/01/16 1340   05/01/16 1300  vancomycin (VANCOCIN) IVPB 1000 mg/200 mL premix  Status:  Discontinued     1,000 mg 200 mL/hr over 60 Minutes Intravenous  Once 05/01/16 1258 05/01/16 1308       Objective: Vitals:   05/02/16 1603 05/02/16 2107 05/03/16 0540 05/03/16 0857  BP: 133/67 129/64 129/66   Pulse: 88 89 83   Resp: 20 20 20    Temp: 99.6 F (37.6 C) 99.5 F (37.5 C) 99 F (37.2 C)   TempSrc: Oral Oral Oral   SpO2: 98% 98% 98%   Weight:    (!) 153.4 kg (338 lb 3 oz)    Intake/Output Summary (Last 24 hours) at 05/03/16 1157 Last data filed at 05/03/16 0600  Gross per 24 hour  Intake             1091 ml  Output             1200 ml  Net             -109 ml   Filed Weights   05/01/16 1156 05/03/16 0857  Weight: (!) 144.2 kg (318 lb) (!) 153.4 kg (338 lb 3 oz)    Examination: General exam: Appears comfortable  HEENT: PERRLA, oral mucosa moist, no sclera icterus or thrush Respiratory system: Clear to auscultation. Respiratory effort normal. Pulse ox in 90s on 2 l today Cardiovascular system: S1 & S2 heard, RRR.  No murmurs  Gastrointestinal system: Abdomen soft, non-tender, nondistended. Normal bowel sound. No organomegaly Central nervous system:  AAO x 3, no focal deficits Extremities: No cyanosis, clubbing + edema LE Skin: wounds on inner thighs, thickened dark skin on LE Psychiatry:  Mood & affect appropriate.      Data Reviewed: I have personally reviewed following labs and imaging studies  CBC:  Recent Labs Lab 05/01/16 1218 05/02/16 0555 05/02/16 0830 05/02/16 1830 05/03/16 0533  WBC 14.0* 13.1* 13.4*  --  12.2*  NEUTROABS 11.0*  --   --   --   --   HGB 8.3* 6.6* 6.8* 7.8* 8.2*  HCT 25.2* 19.2* 20.8* 23.6* 24.6*  MCV 83.7  83.8 82.9  --  84.2  PLT 428* 498* 475*  --  455*   Basic Metabolic Panel:  Recent Labs Lab 05/01/16 1218 05/02/16 0555 05/03/16 0533  NA 140 140  --   K 3.5 3.9  --   CL 98* 99*  --   CO2 23 23  --   GLUCOSE 102* 120*  --   BUN 98* 108*  --   CREATININE 7.96* 7.90* 7.84*  CALCIUM 8.7* 8.4*  --    GFR: Estimated Creatinine Clearance: 14.7 mL/min (A) (by C-G formula based on SCr of 7.84 mg/dL (H)). Liver Function Tests:  Recent Labs Lab 05/01/16 1218 05/02/16 0555  AST 9* 10*  ALT 8* 7*  ALKPHOS 65 60  BILITOT 0.9 1.0  PROT 7.9 7.1  ALBUMIN 2.4* 2.2*   No results for input(s): LIPASE, AMYLASE in the last 168 hours. No results for input(s): AMMONIA in the last 168 hours. Coagulation Profile: No results for input(s): INR, PROTIME in the last 168 hours. Cardiac Enzymes: No results for input(s): CKTOTAL, CKMB, CKMBINDEX, TROPONINI in the last 168 hours. BNP (last 3 results) No results for input(s): PROBNP in the last 8760 hours. HbA1C: No results for input(s): HGBA1C in the last 72 hours. CBG:  Recent Labs Lab 05/02/16 1142 05/02/16 1620 05/02/16 2123 05/03/16 0725 05/03/16 1142  GLUCAP 124* 139* 129* 116* 123*   Lipid Profile: No results for input(s): CHOL, HDL, LDLCALC, TRIG, CHOLHDL, LDLDIRECT in the last 72 hours. Thyroid Function Tests: No results for input(s): TSH, T4TOTAL, FREET4, T3FREE, THYROIDAB in the last 72 hours. Anemia Panel:  Recent Labs  05/03/16 0533  VITAMINB12 222   Urine analysis:    Component Value Date/Time   COLORURINE STRAW (A) 05/01/2016 1144   APPEARANCEUR CLEAR 05/01/2016 1144   LABSPEC  1.009 05/01/2016 1144   PHURINE 5.0 05/01/2016 1144   GLUCOSEU NEGATIVE 05/01/2016 1144   HGBUR SMALL (A) 05/01/2016 1144   BILIRUBINUR NEGATIVE 05/01/2016 1144   KETONESUR NEGATIVE 05/01/2016 1144   PROTEINUR 30 (A) 05/01/2016 1144   UROBILINOGEN 0.2 10/26/2013 1721   NITRITE NEGATIVE 05/01/2016 1144   LEUKOCYTESUR NEGATIVE 05/01/2016 1144   Sepsis Labs: @LABRCNTIP (procalcitonin:4,lacticidven:4) ) Recent Results (from the past 240 hour(s))  Urine culture     Status: None   Collection Time: 05/01/16 11:44 AM  Result Value Ref Range Status   Specimen Description URINE, RANDOM  Final   Special Requests NONE  Final   Culture   Final    NO GROWTH Performed at [facility], [address], [place], [place] [address]    Report Status 05/02/2016 FINAL  Final  Blood Culture (routine x 2)     Status: None (Preliminary result)   Collection Time: 05/01/16 12:20 PM  Result Value Ref Range Status   Specimen Description BLOOD LEFT ANTECUBITAL  Final   Special Requests BOTTLES DRAWN AEROBIC AND ANAEROBIC 5CC  Final   Culture  Setup Time   Final    GRAM POSITIVE COCCI IN CLUSTERS AEROBIC BOTTLE ONLY CRITICAL RESULT CALLED TO, READ BACK BY AND VERIFIED WITH: T GREEN PHARMD 1846 05/02/16 A BROWNING Performed at [facility], [address], [place], [place] [address]    Culture GRAM POSITIVE COCCI  Final   Report Status PENDING  Incomplete  Blood Culture ID Panel (Reflexed)     Status: None   Collection Time: 05/01/16 12:20 PM  Result Value Ref Range Status   Enterococcus species NOT DETECTED NOT DETECTED Final  Listeria monocytogenes NOT DETECTED NOT DETECTED Final   Staphylococcus species NOT DETECTED NOT DETECTED Final   Staphylococcus aureus NOT DETECTED NOT DETECTED Final   Streptococcus species NOT DETECTED NOT DETECTED Final   Streptococcus agalactiae NOT DETECTED NOT DETECTED Final   Streptococcus pneumoniae NOT DETECTED NOT DETECTED Final   Streptococcus pyogenes  NOT DETECTED NOT DETECTED Final   Acinetobacter baumannii NOT DETECTED NOT DETECTED Final   Enterobacteriaceae species NOT DETECTED NOT DETECTED Final   Enterobacter cloacae complex NOT DETECTED NOT DETECTED Final   Escherichia coli NOT DETECTED NOT DETECTED Final   Klebsiella oxytoca NOT DETECTED NOT DETECTED Final   Klebsiella pneumoniae NOT DETECTED NOT DETECTED Final   Proteus species NOT DETECTED NOT DETECTED Final   Serratia marcescens NOT DETECTED NOT DETECTED Final   Haemophilus influenzae NOT DETECTED NOT DETECTED Final   Neisseria meningitidis NOT DETECTED NOT DETECTED Final   Pseudomonas aeruginosa NOT DETECTED NOT DETECTED Final   Candida albicans NOT DETECTED NOT DETECTED Final   Candida glabrata NOT DETECTED NOT DETECTED Final   Candida krusei NOT DETECTED NOT DETECTED Final   Candida parapsilosis NOT DETECTED NOT DETECTED Final   Candida tropicalis NOT DETECTED NOT DETECTED Final    Comment: Performed at [facility], [address] [place], [place] [address]  Blood Culture (routine x 2)     Status: None (Preliminary result)   Collection Time: 05/01/16 12:26 PM  Result Value Ref Range Status   Specimen Description BLOOD LEFT HAND  Final   Special Requests BOTTLES DRAWN AEROBIC AND ANAEROBIC 5CC  Final   Culture   Final    NO GROWTH < 24 HOURS Performed at [facility], [address] [place], [place] [address]    Report Status PENDING  Incomplete         Radiology Studies: Dg Chest 2 View  Result Date: 05/01/2016 CLINICAL DATA:  Fever and weakness. EXAM: CHEST  2 VIEW COMPARISON:  03/22/2016. FINDINGS: Cardiomegaly with mild bilateral interstitial prominence. No pleural effusion or pneumothorax. No acute bony abnormality. IMPRESSION: Cardiomegaly with mild bilateral interstitial prominence consistent with CHF. Findings have improved from prior study of 03/22/2016. Given patient's history of fever pneumonitis cannot be excluded. Electronically Signed    By: Maisie Fushomas  Register   On: 05/01/2016 13:30   Ct Chest Wo Contrast  Result Date: 05/02/2016 CLINICAL DATA:  Morbid obesity week chronic kidney disease and diabetes. Complains of weakness and fever over the past week. Cough. EXAM: CT CHEST WITHOUT CONTRAST TECHNIQUE: Multidetector CT imaging of the chest was performed following the standard protocol without IV contrast. COMPARISON:  Chest x-ray 05/01/2016 FINDINGS: Cardiovascular: Mild to moderate cardiomegaly. Remaining vascular structures are unremarkable. Mediastinum/Nodes: No definite mediastinal or hilar adenopathy. Remaining mediastinal structures are unremarkable. Lungs/Pleura: Lungs are somewhat hypoinflated and demonstrate mild bibasilar linear atelectasis. Linear density over the posterior and anterior right middle lobe likely atelectasis. No evidence of effusion. Airways are unremarkable. Upper Abdomen: Mild calcified plaque over the celiac axis, splenic artery and superior mesenteric arteries. Musculoskeletal: Minimal degenerative change of the spine. Prominent overlying soft tissues. IMPRESSION: Linear density over the right middle lobe likely atelectasis, although cannot completely exclude infection. Minimal linear bibasilar atelectasis. Moderate cardiomegaly. Electronically Signed   By: Elberta Fortisaniel  Boyle M.D.   On: 05/02/2016 15:53      Scheduled Meds: . amLODipine  10 mg Oral Daily  . atorvastatin  20 mg Oral Daily  . carvedilol  25 mg Oral BID WC  .  cyanocobalamin  1,000 mcg Subcutaneous Daily  . doxycycline  100 mg Oral Q12H  . ferrous sulfate  325 mg Oral TID WC  . furosemide  160 mg Intravenous Q6H  . heparin  5,000 Units Subcutaneous Q8H  . hydrALAZINE  100 mg Oral TID  . insulin aspart  0-5 Units Subcutaneous QHS  . insulin aspart  0-9 Units Subcutaneous TID WC  . isosorbide mononitrate  30 mg Oral Daily  . metolazone  5 mg Oral BID  . multivitamin with minerals  1 tablet Oral Daily  . protein supplement shake  11 oz Oral TID  BM   Continuous Infusions:   LOS: 2 days    Time spent in minutes: 45    Marilea Gwynne, MD [facility] Pager: [email or web address] Password [facility] 05/03/2016, 11:57 AM

## 2016-05-03 NOTE — [place] FL2 (Signed)
[place] MEDICAID FL2 LEVEL OF CARE SCREENING TOOL     IDENTIFICATION  Patient Name: Linda DykesDavenia McKinnon [name] Birthdate: 02/03/1976 Sex: female Admission Date (Current Location): 05/01/2016  [facility] [place]   Facility and Address:  [facility],  [address], [place] [id number]      Provider Number: [id number]  Attending Physician Name and Address:  Calvert CantorSaima Rizwan, MD  Relative Name and Phone Number:       Current Level of Care: Hospital Recommended Level of Care: Skilled Nursing Facility Prior Approval Number:    Date Approved/Denied:   PASRR Number: [id number]  Discharge Plan: SNF    Current Diagnoses: Patient Active Problem List   Diagnosis Date Noted  . Thigh ulcer, left, limited to breakdown of skin (HCC) 05/03/2016  . Thigh ulcer, right, limited to breakdown of skin (HCC) 05/03/2016  . Acute hypoxemic respiratory failure (HCC) 05/02/2016  . Physical deconditioning   . Goals of care, counseling/discussion   . Palliative care encounter   . Acute pulmonary edema (HCC)   . Influenza A 03/23/2016  . Metabolic acidosis 03/23/2016  . Acute kidney injury superimposed on CKD (HCC) 03/23/2016  . Flu-like symptoms   . Hypertensive emergency   . Acute on chronic diastolic CHF (congestive heart failure) (HCC) 03/22/2016  . Arm swelling 03/09/2016  . Symptomatic anemia 03/08/2016  . Pressure injury of skin 03/08/2016  . Venous stasis ulcer of right calf without varicose veins (HCC)   . History of CVA with residual deficit   . Super-super obese (HCC)   . Anemia of chronic disease   . Lymphedema   . Lymphocytosis   . Debility   . Dyspnea 12/07/2015  . Acute respiratory failure (HCC)   . E-coli UTI 02/23/2015  . History of stroke 02/19/2015  . Asthma exacerbation 02/19/2015  . Cellulitis of left lower extremity   . Essential hypertension   . Chronic kidney disease (CKD), stage IV (severe) (HCC) 02/10/2015  . Aortic valve mass    . Anasarca 02/05/2015  . Hypoalbuminemia 02/05/2015  . Morbid obesity (HCC) 02/05/2015  . Hypertriglyceridemia 09/16/2013  . Diarrhea 09/16/2013  . Cerebral embolism with cerebral infarction (HCC) 09/04/2013  . Thigh abscess 08/30/2013  . Acute respiratory failure with hypoxia (HCC) 08/30/2013    Orientation RESPIRATION BLADDER Height & Weight     Self, Time, Situation, Place  O2 (2L [place]) Indwelling catheter Weight: (!) 338 lb 3 oz (153.4 kg) Height:     BEHAVIORAL SYMPTOMS/MOOD NEUROLOGICAL BOWEL NUTRITION STATUS      Continent Diet (cardiac, carb modifed)  AMBULATORY STATUS COMMUNICATION OF NEEDS Skin   Extensive Assist Verbally PU Stage and Appropriate Care   PU Stage 2 Dressing:  (on thigh- impregnated gauze)                   Personal Care Assistance Level of Assistance  Bathing, Dressing Bathing Assistance: Maximum assistance   Dressing Assistance: Maximum assistance     Functional Limitations Info             SPECIAL CARE FACTORS FREQUENCY  PT (By licensed PT), OT (By licensed OT)     PT Frequency: 5/wk OT Frequency: 5/wk            Contractures      Additional Factors Info  Code Status, Allergies, Insulin Sliding Scale Code Status Info: FULL Allergies Info: Azithromycin   Insulin Sliding Scale Info: 4/day       Current Medications (05/03/2016):  This is the current hospital active medication list Current Facility-Administered Medications  Medication Dose Route Frequency Provider Last Rate Last Dose  . acetaminophen (TYLENOL) tablet 650 mg  650 mg Oral Q4H PRN Calvert Cantor, MD   650 mg at 05/03/16 1508  . albuterol (PROVENTIL) (2.5 MG/3ML) 0.083% nebulizer solution 2.5 mg  2.5 mg Nebulization Q6H PRN Leatha Gilding, MD   2.5 mg at 05/02/16 0233  . amLODipine (NORVASC) tablet 10 mg  10 mg Oral Daily Costin Otelia Sergeant, MD   10 mg at 05/03/16 1046  . atorvastatin (LIPITOR) tablet 20 mg  20 mg Oral Daily Costin Otelia Sergeant, MD   20 mg at 05/03/16  1045  . camphor-menthol (SARNA) lotion 1 application  1 application Topical Q8H PRN Costin Otelia Sergeant, MD      . carvedilol (COREG) tablet 25 mg  25 mg Oral BID WC Leatha Gilding, MD   25 mg at 05/03/16 0824  . cyanocobalamin ((VITAMIN B-12)) injection 1,000 mcg  1,000 mcg Subcutaneous Daily Saima Rizwan, MD      . doxycycline (VIBRA-TABS) tablet 100 mg  100 mg Oral Q12H Saima Rizwan, MD   100 mg at 05/03/16 1046  . feeding supplement (PRO-STAT SUGAR FREE 64) liquid 30 mL  30 mL Oral BID Calvert Cantor, MD      . ferrous sulfate tablet 325 mg  325 mg Oral TID WC Costin Otelia Sergeant, MD   325 mg at 05/03/16 1251  . furosemide (LASIX) 160 mg in dextrose 5 % 50 mL IVPB  160 mg Intravenous Q6H Delano Metz, MD   160 mg at 05/03/16 1046  . guaiFENesin-dextromethorphan (ROBITUSSIN DM) 100-10 MG/5ML syrup 5 mL  5 mL Oral Q4H PRN Leatha Gilding, MD   5 mL at 05/01/16 1620  . heparin injection 5,000 Units  5,000 Units Subcutaneous Q8H Leatha Gilding, MD   5,000 Units at 05/03/16 1507  . hydrALAZINE (APRESOLINE) tablet 100 mg  100 mg Oral TID Leatha Gilding, MD   100 mg at 05/03/16 1507  . insulin aspart (novoLOG) injection 0-5 Units  0-5 Units Subcutaneous QHS Costin Otelia Sergeant, MD      . insulin aspart (novoLOG) injection 0-9 Units  0-9 Units Subcutaneous TID WC Costin Otelia Sergeant, MD   1 Units at 05/03/16 1251  . isosorbide mononitrate (IMDUR) 24 hr tablet 30 mg  30 mg Oral Daily Costin Otelia Sergeant, MD   30 mg at 05/03/16 1046  . metolazone (ZAROXOLYN) tablet 5 mg  5 mg Oral BID Delano Metz, MD   5 mg at 05/03/16 1610  . multivitamin with minerals tablet 1 tablet  1 tablet Oral Daily Calvert Cantor, MD   1 tablet at 05/03/16 1046  . ondansetron (ZOFRAN) tablet 4 mg  4 mg Oral Q6H PRN Costin Otelia Sergeant, MD       Or  . ondansetron (ZOFRAN) injection 4 mg  4 mg Intravenous Q6H PRN Leatha Gilding, MD   4 mg at 05/02/16 0906     Discharge Medications: Please see discharge summary for a list of discharge  medications.  Relevant Imaging Results:  Relevant Lab Results:   Additional Information SS#: [id number]  Burna Sis, LCSW

## 2016-05-03 NOTE — Progress Notes (Signed)
[facility] Progress Note   Subjective: 1300 uOP yest, more drowsy today  Vitals:   05/03/16 0857 05/03/16 1020 05/03/16 1328 05/03/16 1617  BP:  140/71 (!) 135/57   Pulse:   85   Resp:   18   Temp:   98.7 F (37.1 C)   TempSrc:   Oral   SpO2:   99% 96%  Weight: (!) 153.4 kg (338 lb 3 oz)       Inpatient medications: . amLODipine  10 mg Oral Daily  . atorvastatin  20 mg Oral Daily  . carvedilol  25 mg Oral BID WC  . cyanocobalamin  1,000 mcg Subcutaneous Daily  . doxycycline  100 mg Oral Q12H  . ferrous sulfate  325 mg Oral TID WC  . furosemide  160 mg Intravenous Q6H  . heparin  5,000 Units Subcutaneous Q8H  . hydrALAZINE  100 mg Oral TID  . insulin aspart  0-5 Units Subcutaneous QHS  . insulin aspart  0-9 Units Subcutaneous TID WC  . isosorbide mononitrate  30 mg Oral Daily  . metolazone  10 mg Oral BID  . multivitamin with minerals  1 tablet Oral Daily    acetaminophen, albuterol, camphor-menthol, guaiFENesin-dextromethorphan, ondansetron **OR** ondansetron (ZOFRAN) IV  Exam: Gen morbidly obese, +some myoclonic jerking today and is lethargic No rash, cyanosis or gangrene Sclera anicteric, throat clear  No jvd or bruits Chest clear ant/lat, some scattered rales at the bases, no wheezing RRR no MRG Abd soft ntnd no mass or ascites +bs GU foley w/o much urine in the bag MS no joint effusions or deformity Ext diffuse 2+ pitting edema of arms and lower legs Neuro is alert, Ox 3 , nf, gen'd weakness, mod to severe    Assessment: 1. 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  Looks like is becoming uremic, +myoclonic jerking and somnolence.  Per patient she is able to transfer and spends most of her time in the wheelchair of late.  Not sure if OP HD will be possible but looks like for now we will need to go ahead with dialysis tomorrow acutely.  Will ask IR to see for tunneled cath.   2. Vol overload - pulm edema on CXR,  sig body edema.  3. SOB - due to #2 most likely 4. Anemia - of CKD +/- other, sp prbc's 5. Morbid obesity 6. Debility - not sure what her mobility is in OP setting 7. Hist CVA  Plan - transfer to [facility], IR consult for [facility], HD tomorrow. Cont diuretics for now.    Vinson Moselle MD [facility] pager [phone]   05/03/2016, 4:26 PM    Recent Labs Lab 05/01/16 1218 05/02/16 0555 05/03/16 0533  NA 140 140  --   K 3.5 3.9  --   CL 98* 99*  --   CO2 23 23  --   GLUCOSE 102* 120*  --   BUN 98* 108*  --   CREATININE 7.96* 7.90* 7.84*  CALCIUM 8.7* 8.4*  --     Recent Labs Lab 05/01/16 1218 05/02/16 0555  AST 9* 10*  ALT 8* 7*  ALKPHOS 65 60  BILITOT 0.9 1.0  PROT 7.9 7.1  ALBUMIN 2.4* 2.2*    Recent Labs Lab 05/01/16 1218 05/02/16 0555 05/02/16 0830 05/02/16 1830 05/03/16 0533  WBC 14.0* 13.1* 13.4*  --  12.2*  NEUTROABS 11.0*  --   --   --   --  HGB 8.3* 6.6* 6.8* 7.8* 8.2*  HCT 25.2* 19.2* 20.8* 23.6* 24.6*  MCV 83.7 83.8 82.9  --  84.2  PLT 428* 498* 475*  --  455*   Iron/TIBC/Ferritin/ %Sat    Component Value Date/Time   IRON 74 03/22/2016 1835   TIBC 165 (L) 03/22/2016 1835   FERRITIN 457 (H) 03/22/2016 1835   IRONPCTSAT 45 (H) 03/22/2016 1835

## 2016-05-03 NOTE — Progress Notes (Signed)
Patient c/o shortness of breath.  No wheezing.  O2 sats 99% on 2 L.   Patient slid up and repositioned in bed multiple times this hour by staff.  Patient c/o wanting to get up.  Noted agitation increased last hour.  It was explained that [facility] and must be in bed for transport.  Attempted to call report x 2 to accepting nurse [facility].  [facility] was called.  Oncoming nurse notified.

## 2016-05-03 NOTE — Progress Notes (Signed)
[facility].

## 2016-05-03 NOTE — Consult Note (Signed)
WOC Nurse wound consult note Patient consult received after a WOC team member, Barnett HatterMelinda Kallam, WTA-C had seen patient and provided Nursing with orders for care of the two medial thigh wounds.  I agree with her POC and assesment.   WOC nursing team will not follow, but will remain available to this patient, the nursing and medical teams.  Please re-consult if needed. Thanks, Ladona MowLaurie Eraina Winnie, MSN, RN, GNP, Hans EdenCWOCN, CWON-AP, FAAN  Pager# [phone]

## 2016-05-03 NOTE — Clinical Social Work Note (Signed)
Clinical Social Work Assessment  Patient Details  Name: Linda DykesDavenia McKinnon [name] MRN: [id number] Date of Birth: 01/13/1976  Date of referral:  05/03/16               Reason for consult:  Facility Placement                Permission sought to share information with:  Facility Industrial/product designerContact Representative Permission granted to share information::  Yes, Verbal Permission Granted  Name::        Agency::  SNF  Relationship::     Contact Information:     Housing/Transportation Living arrangements for the past 2 months:  Apartment Source of Information:  Patient Patient Interpreter Needed:  None Criminal Activity/Legal Involvement Pertinent to Current Situation/Hospitalization:  No - Comment as needed Significant Relationships:  Spouse, Dependent Children Lives with:  Minor Children, Spouse Do you feel safe going back to the place where you live?    Need for family participation in patient care:  Yes (Comment)  Care giving concerns:  Pt lives at home with spouse- has chronic medical concerns and now with leg wounds and decreased mobility- not enough support at home to manage her needs.   Social Worker assessment / plan:  CSW spoke with pt about PT recommendation for SNF.  Pt has been talked to about this before and is understanding of the process.  CSW explained 30 day Medicaid rule and that SNF payment comes out of pt disability check.  Pt unsure if she could afford this but will think it over.  Employment status:  Disabled (Comment on whether or not currently receiving Disability) (receives disability) Insurance information:  [place] Recommendations:  Skilled Nursing Facility Information / Referral to community resources:  Skilled Nursing Facility  Patient/Family's Response to care:  Unsure about SNF stay due to expense but willing for CSW to look into it- she is very hopeful placement in [place] could be found.  Patient/Family's Understanding of and Emotional Response to  Diagnosis, Current Treatment, and Prognosis:  Unsure pt level of understanding of her current medical condition- she is hopeful rehab stay would lead to more independence at home.  Emotional Assessment Appearance:  Appears older than stated age Attitude/Demeanor/Rapport:    Affect (typically observed):  Flat, Appropriate Orientation:  Oriented to Self, Oriented to Place, Oriented to  Time, Oriented to Situation Alcohol / Substance use:  Not Applicable Psych involvement (Current and /or in the community):  No (Comment)  Discharge Needs  Concerns to be addressed:  Care Coordination Readmission within the last 30 days:  No Current discharge risk:  Physical Impairment Barriers to Discharge:  Continued Medical Work up   Burna SisUris, Zalyn Amend H, LCSW 05/03/2016, 3:44 PM

## 2016-05-04 ENCOUNTER — Encounter (HOSPITAL_COMMUNITY): Payer: Self-pay | Admitting: Internal Medicine

## 2016-05-04 DIAGNOSIS — E785 Hyperlipidemia, unspecified: Secondary | ICD-10-CM

## 2016-05-04 DIAGNOSIS — Z794 Long term (current) use of insulin: Secondary | ICD-10-CM

## 2016-05-04 DIAGNOSIS — J9601 Acute respiratory failure with hypoxia: Secondary | ICD-10-CM | POA: Diagnosis present

## 2016-05-04 DIAGNOSIS — N179 Acute kidney failure, unspecified: Secondary | ICD-10-CM | POA: Diagnosis present

## 2016-05-04 DIAGNOSIS — E1165 Type 2 diabetes mellitus with hyperglycemia: Secondary | ICD-10-CM

## 2016-05-04 DIAGNOSIS — N185 Chronic kidney disease, stage 5: Secondary | ICD-10-CM

## 2016-05-04 DIAGNOSIS — J181 Lobar pneumonia, unspecified organism: Secondary | ICD-10-CM

## 2016-05-04 DIAGNOSIS — IMO0002 Reserved for concepts with insufficient information to code with codable children: Secondary | ICD-10-CM

## 2016-05-04 DIAGNOSIS — J189 Pneumonia, unspecified organism: Secondary | ICD-10-CM | POA: Diagnosis present

## 2016-05-04 DIAGNOSIS — G9341 Metabolic encephalopathy: Secondary | ICD-10-CM | POA: Diagnosis present

## 2016-05-04 DIAGNOSIS — D72829 Elevated white blood cell count, unspecified: Secondary | ICD-10-CM | POA: Diagnosis present

## 2016-05-04 DIAGNOSIS — E1169 Type 2 diabetes mellitus with other specified complication: Secondary | ICD-10-CM | POA: Diagnosis present

## 2016-05-04 DIAGNOSIS — E1121 Type 2 diabetes mellitus with diabetic nephropathy: Secondary | ICD-10-CM

## 2016-05-04 LAB — CULTURE, BLOOD (ROUTINE X 2)

## 2016-05-04 LAB — GLUCOSE, CAPILLARY
GLUCOSE-CAPILLARY: 139 mg/dL — AB (ref 65–99)
Glucose-Capillary: 144 mg/dL — ABNORMAL HIGH (ref 65–99)
Glucose-Capillary: 161 mg/dL — ABNORMAL HIGH (ref 65–99)
Glucose-Capillary: 174 mg/dL — ABNORMAL HIGH (ref 65–99)

## 2016-05-04 MED ORDER — POTASSIUM CHLORIDE CRYS ER 20 MEQ PO TBCR
40.0000 meq | EXTENDED_RELEASE_TABLET | Freq: Once | ORAL | Status: AC
  Administered 2016-05-04: 40 meq via ORAL
  Filled 2016-05-04: qty 2

## 2016-05-04 NOTE — Progress Notes (Signed)
Patient ID: [name], female   DOB: 08-Nov-1975, 41 y.o.   MRN: [id number]  PROGRESS NOTE    Jazell Rosenau  WUJ:[id number] DOB: [birth year] DOA: 05/01/2016  PCP: Marletta Lor, NP   Brief Narrative:  41 y.o.femalewith medical history significant for morbid obesity, chronic kidney disease stage IV-V, diabetes mellitus, hypertension, prior CVA, diastolic CHF who presented to ED with progressive weakness, fever for past 1 week prior to this admission. She is actually bed bound for past year or so per her husband report ever since stroke. She also had dry cough and shortness of breath for past 1 week. She was hemodynamically stable on admission. Her creatinine was 7.9 (much worse since her recent Cr values of 4-5). Renal has seen her in consultation. Due to her debility she is not a good candidate for HD. Palliative care was consulted as well.   Assessment & Plan:   Acute metabolic encephalopathy - Likely multifactorial from history of CVA, end stage renal disease, possible uremia, acute infectious process - possible pneumonia - No significant changes in menlal status   Acute renal failure superimposed on CKD stage 5 not on HD / Acute pulmonary edema  - Due to progressive disease from uncontrolled diabetes and FSG - Cr continues to trend up, 7.65 this am - Appreciate renal following - Per renal, pt not a good candidate for HD because hse is not able to get up from the bed. She has been bed bound for past year or so after the stroke - Palliative consulted for goals of care - Continue IV lasix per renal recommendations  - Continue metolazone   Acute respiratory failure with hypoxia / Right middle lobe pneumonia / Leukocytosis  - CT chest without contrast showed linear density over the right middle lobe likely atelectasis, although cannot completely exclude infection - She is on empiric doxycycline  - Blood cx so far are negative (1 bottle), 2nd bottle grew staph species  coag negative, likely contaminant   Hypokalemia - Due to lasix - Supplemented - Follow up BMP in am  Anemia of chronic disease / IDA - Due to CKD - Status post 2 units PRBC transfusion on 05/02/2016  - Aranesp 3/22 - Hgb this am 8.2 - Continue ferrous sulfate supplementation - B12 is low normal- give 3 doses of s/c B12 followed by oral daily  Uncontrolled diabetes mellitus with diabetic nephropathy with long term insulin use - Continue current insulin regimen: SSI - CBG"s in past 24 hours: 139, 144, 161  Dyslipidemia associated with type 2 DM - Continue statin therapy   Essential hypertension - Continue Norvasc, coreg, hydralazine, Imdur   Bilateral inner thigh wounds - Related to moisture and friction from thighs rubbing together - Left inner thigh has large area with many open areas scattered throughout, 17cm x 12cm x 0.1cm  - Right inner thigh wound is 8cm x 5cm x 0.3cm  - Dressing procedure/placement/frequency: Cleanse with NS, gently pat dry, apply Xeroform gauze, top with dry gauze,change every shift. Pt is already on a low air loss mattress  Morbid obesity / Debility  - Nutrition consulted - Body mass index is 54.58 kg/m.   DVT prophylaxis: Heparin subQ Code Status: full code  Family Communication: husband at bedside this am Disposition Plan: not yet stable for discharge, need to continue to monitor renal function, palliative consulted as well   Consultants:   Nephrology, Dr. Delano Metz  WOC  Palliative care   Nutrition  Procedures:   None  Antimicrobials:   Doxycycline 05/01/2016 -->   Subjective: No overnight events.   Objective: Vitals:   05/03/16 1617 05/03/16 2322 05/04/16 0508 05/04/16 0923  BP:  (!) 122/50 (!) 115/56 136/67  Pulse:  87 88 88  Resp:  20 20 18   Temp:  97.9 F (36.6 C) 98.8 F (37.1 C) 98.7 F (37.1 C)  TempSrc:  Oral Oral Oral  SpO2: 96% 94% 92% 96%  Weight:        Intake/Output Summary (Last 24 hours) at  05/04/16 1851 Last data filed at 05/04/16 1241  Gross per 24 hour  Intake              120 ml  Output             1100 ml  Net             -980 ml   Filed Weights   05/01/16 1156 05/03/16 0857  Weight: (!) 144.2 kg (318 lb) (!) 153.4 kg (338 lb 3 oz)    Examination:  General exam: Appears calm and comfortable  Respiratory system: diminished breath sounds, no wheezing  Cardiovascular system: S1 & S2 heard, Rate controlled  Gastrointestinal system: Abdomen is obese, (+) BS Central nervous system: not following commands Extremities: no tenderness and has lymphedema . Skin: wounds on her thigh with dressing in place, lymphedema (+)  Psychiatry: not agitated or restless   Data Reviewed: I have personally reviewed following labs and imaging studies  CBC:  Recent Labs Lab 05/01/16 1218 05/02/16 0555 05/02/16 0830 05/02/16 1830 05/03/16 0533  WBC 14.0* 13.1* 13.4*  --  12.2*  NEUTROABS 11.0*  --   --   --   --   HGB 8.3* 6.6* 6.8* 7.8* 8.2*  HCT 25.2* 19.2* 20.8* 23.6* 24.6*  MCV 83.7 83.8 82.9  --  84.2  PLT 428* 498* 475*  --  455*   Basic Metabolic Panel:  Recent Labs Lab 05/01/16 1218 05/02/16 0555 05/03/16 0533 05/03/16 1520  NA 140 140  --  138  K 3.5 3.9  --  3.2*  CL 98* 99*  --  100*  CO2 23 23  --  24  GLUCOSE 102* 120*  --  140*  BUN 98* 108*  --  117*  CREATININE 7.96* 7.90* 7.84* 7.65*  CALCIUM 8.7* 8.4*  --  8.2*  PHOS  --   --   --  8.8*   GFR: Estimated Creatinine Clearance: 15.1 mL/min (A) (by C-G formula based on SCr of 7.65 mg/dL (H)). Liver Function Tests:  Recent Labs Lab 05/01/16 1218 05/02/16 0555 05/03/16 1520  AST 9* 10*  --   ALT 8* 7*  --   ALKPHOS 65 60  --   BILITOT 0.9 1.0  --   PROT 7.9 7.1  --   ALBUMIN 2.4* 2.2* 2.1*   No results for input(s): LIPASE, AMYLASE in the last 168 hours. No results for input(s): AMMONIA in the last 168 hours. Coagulation Profile: No results for input(s): INR, PROTIME in the last 168  hours. Cardiac Enzymes: No results for input(s): CKTOTAL, CKMB, CKMBINDEX, TROPONINI in the last 168 hours. BNP (last 3 results) No results for input(s): PROBNP in the last 8760 hours. HbA1C: No results for input(s): HGBA1C in the last 72 hours. CBG:  Recent Labs Lab 05/03/16 1629 05/03/16 2308 05/04/16 0804 05/04/16 1217 05/04/16 1706  GLUCAP 126* 123* 139* 144* 161*   Lipid Profile: No results for input(s): CHOL, HDL, LDLCALC, TRIG,  CHOLHDL, LDLDIRECT in the last 72 hours. Thyroid Function Tests: No results for input(s): TSH, T4TOTAL, FREET4, T3FREE, THYROIDAB in the last 72 hours. Anemia Panel:  Recent Labs  05/03/16 0533  VITAMINB12 222   Urine analysis:    Component Value Date/Time   COLORURINE STRAW (A) 05/01/2016 1144   APPEARANCEUR CLEAR 05/01/2016 1144   LABSPEC 1.009 05/01/2016 1144   PHURINE 5.0 05/01/2016 1144   GLUCOSEU NEGATIVE 05/01/2016 1144   HGBUR SMALL (A) 05/01/2016 1144   BILIRUBINUR NEGATIVE 05/01/2016 1144   KETONESUR NEGATIVE 05/01/2016 1144   PROTEINUR 30 (A) 05/01/2016 1144   UROBILINOGEN 0.2 10/26/2013 1721   NITRITE NEGATIVE 05/01/2016 1144   LEUKOCYTESUR NEGATIVE 05/01/2016 1144   Sepsis Labs: @LABRCNTIP (procalcitonin:4,lacticidven:4)   ) Recent Results (from the past 240 hour(s))  Urine culture     Status: None   Collection Time: 05/01/16 11:44 AM  Result Value Ref Range Status   Specimen Description URINE, RANDOM  Final   Special Requests NONE  Final   Culture   Final    NO GROWTH Performed at [facility], [address] [place], [place] [address]    Report Status 05/02/2016 FINAL  Final  Blood Culture (routine x 2)     Status: Abnormal   Collection Time: 05/01/16 12:20 PM  Result Value Ref Range Status   Specimen Description BLOOD LEFT ANTECUBITAL  Final   Special Requests BOTTLES DRAWN AEROBIC AND ANAEROBIC 5CC  Final   Culture  Setup Time   Final    GRAM POSITIVE COCCI IN CLUSTERS AEROBIC BOTTLE  ONLY CRITICAL RESULT CALLED TO, READ BACK BY AND VERIFIED WITH: T GREEN PHARMD 1846 05/02/16 A BROWNING    Culture (A)  Final    STAPHYLOCOCCUS SPECIES (COAGULASE NEGATIVE) THE SIGNIFICANCE OF ISOLATING THIS ORGANISM FROM A SINGLE SET OF BLOOD CULTURES WHEN MULTIPLE SETS ARE DRAWN IS UNCERTAIN. PLEASE NOTIFY THE MICROBIOLOGY DEPARTMENT WITHIN ONE WEEK IF SPECIATION AND SENSITIVITIES ARE REQUIRED. Performed at [facility], [address], [place], [place] [address]    Report Status 05/04/2016 FINAL  Final  Blood Culture ID Panel (Reflexed)     Status: None   Collection Time: 05/01/16 12:20 PM  Result Value Ref Range Status   Enterococcus species NOT DETECTED NOT DETECTED Final   Listeria monocytogenes NOT DETECTED NOT DETECTED Final   Staphylococcus species NOT DETECTED NOT DETECTED Final   Staphylococcus aureus NOT DETECTED NOT DETECTED Final   Streptococcus species NOT DETECTED NOT DETECTED Final   Streptococcus agalactiae NOT DETECTED NOT DETECTED Final   Streptococcus pneumoniae NOT DETECTED NOT DETECTED Final   Streptococcus pyogenes NOT DETECTED NOT DETECTED Final   Acinetobacter baumannii NOT DETECTED NOT DETECTED Final   Enterobacteriaceae species NOT DETECTED NOT DETECTED Final   Enterobacter cloacae complex NOT DETECTED NOT DETECTED Final   Escherichia coli NOT DETECTED NOT DETECTED Final   Klebsiella oxytoca NOT DETECTED NOT DETECTED Final   Klebsiella pneumoniae NOT DETECTED NOT DETECTED Final   Proteus species NOT DETECTED NOT DETECTED Final   Serratia marcescens NOT DETECTED NOT DETECTED Final   Haemophilus influenzae NOT DETECTED NOT DETECTED Final   Neisseria meningitidis NOT DETECTED NOT DETECTED Final   Pseudomonas aeruginosa NOT DETECTED NOT DETECTED Final   Candida albicans NOT DETECTED NOT DETECTED Final   Candida glabrata NOT DETECTED NOT DETECTED Final   Candida krusei NOT DETECTED NOT DETECTED Final   Candida parapsilosis NOT DETECTED NOT DETECTED Final    Candida tropicalis NOT DETECTED NOT  DETECTED Final    Comment: Performed at [facility], [address] [place], [place] [address]  Blood Culture (routine x 2)     Status: None (Preliminary result)   Collection Time: 05/01/16 12:26 PM  Result Value Ref Range Status   Specimen Description BLOOD LEFT HAND  Final   Special Requests BOTTLES DRAWN AEROBIC AND ANAEROBIC 5CC  Final   Culture   Final    NO GROWTH 3 DAYS Performed at [facility], [address] [place], [place] [address]    Report Status PENDING  Incomplete      Radiology Studies: Dg Chest 2 View Result Date: 05/01/2016 Cardiomegaly with mild bilateral interstitial prominence consistent with CHF. Findings have improved from prior study of 03/22/2016. Given patient's history of fever pneumonitis cannot be excluded.   Ct Chest Wo Contrast Result Date: 05/02/2016 Linear density over the right middle lobe likely atelectasis, although cannot completely exclude infection. Minimal linear bibasilar atelectasis. Moderate cardiomegaly.     Scheduled Meds: . amLODipine  10 mg Oral Daily  . atorvastatin  20 mg Oral Daily  . carvedilol  25 mg Oral BID WC  . cyanocobalamin  1,000 mcg Subcutaneous Daily  . doxycycline  100 mg Oral Q12H  . ferrous sulfate  325 mg Oral TID WC  . furosemide  160 mg Intravenous Q6H  . heparin  5,000 Units Subcutaneous Q8H  . hydrALAZINE  100 mg Oral TID  . insulin aspart  0-5 Units Subcutaneous QHS  . insulin aspart  0-9 Units Subcutaneous TID WC  . isosorbide mononitrate  30 mg Oral Daily  . metolazone  10 mg Oral BID  . multivitamin with minerals  1 tablet Oral Daily   Continuous Infusions:   LOS: 3 days    Time spent: 25 minutes  Greater than 50% of the time spent on counseling and coordinating the care.   Manson PasseyEVINE, India Jolin, MD [facility] Pager [phone]  If 7PM-7AM, please contact night-coverage [email or web address] Password [email or web address] 05/04/2016, 6:51 PM

## 2016-05-04 NOTE — Progress Notes (Addendum)
[facility] Progress Note   Subjective: good UOP on lasix.  No sig SOB , no new c/o's.    Vitals:   05/03/16 1328 05/03/16 1617 05/03/16 2322 05/04/16 0508  BP: (!) 135/57  (!) 122/50 (!) 115/56  Pulse: 85  87 88  Resp: 18  20 20   Temp: 98.7 F (37.1 C)  97.9 F (36.6 C) 98.8 F (37.1 C)  TempSrc: Oral  Oral Oral  SpO2: 99% 96% 94% 92%  Weight:        Inpatient medications: . amLODipine  10 mg Oral Daily  . atorvastatin  20 mg Oral Daily  . carvedilol  25 mg Oral BID WC  . cyanocobalamin  1,000 mcg Subcutaneous Daily  . doxycycline  100 mg Oral Q12H  . ferrous sulfate  325 mg Oral TID WC  . furosemide  160 mg Intravenous Q6H  . heparin  5,000 Units Subcutaneous Q8H  . hydrALAZINE  100 mg Oral TID  . insulin aspart  0-5 Units Subcutaneous QHS  . insulin aspart  0-9 Units Subcutaneous TID WC  . isosorbide mononitrate  30 mg Oral Daily  . metolazone  10 mg Oral BID  . multivitamin with minerals  1 tablet Oral Daily    acetaminophen, albuterol, camphor-menthol, guaiFENesin-dextromethorphan, HYDROcodone-acetaminophen, ondansetron **OR** ondansetron (ZOFRAN) IV  Exam: Gen morbidly obese, awake and alert No rash, cyanosis or gangrene Sclera anicteric, throat clear  No jvd or bruits Chest clear ant/lat, some scattered rales at the bases, no wheezing RRR no MRG Abd soft ntnd no mass or ascites +bs GU foley w/o much urine in the bag MS no joint effusions or deformity Ext diffuse 2+ pitting edema of arms and lower legs Neuro is alert, Ox 3 , nf, gen'd weakness, mod to severe    Assessment: 1. 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  Creat up 7-8 range now. Getting a different story concerning her mobility from the husband than I got from the patient yesterday.  Per husband she is bedbound for about the last year, they have to turn her to use the bathroom , etc.  Is not a HD candidate if bedbound.  Will hold off on HD and  ask palliative care to consult.  Continue lasix for now.   2. Vol overload - pulm edema on CXR, sig body edema. Diuresing.  3. SOB - due to #2, improving 4. Anemia - of CKD +/- other, sp prbc's 5. Morbid obesity 6. Debility - as above 7. Hist CVA  Plan - as above   Vinson Moselleob Anika Shore MD [facility] pager [phone]   05/04/2016, 8:43 AM    Recent Labs Lab 05/01/16 1218 05/02/16 0555 05/03/16 0533 05/03/16 1520  NA 140 140  --  138  K 3.5 3.9  --  3.2*  CL 98* 99*  --  100*  CO2 23 23  --  24  GLUCOSE 102* 120*  --  140*  BUN 98* 108*  --  117*  CREATININE 7.96* 7.90* 7.84* 7.65*  CALCIUM 8.7* 8.4*  --  8.2*  PHOS  --   --   --  8.8*    Recent Labs Lab 05/01/16 1218 05/02/16 0555 05/03/16 1520  AST 9* 10*  --   ALT 8* 7*  --   ALKPHOS 65 60  --   BILITOT 0.9 1.0  --   PROT 7.9 7.1  --   ALBUMIN 2.4* 2.2* 2.1*  Recent Labs Lab 05/01/16 1218 05/02/16 0555 05/02/16 0830 05/02/16 1830 05/03/16 0533  WBC 14.0* 13.1* 13.4*  --  12.2*  NEUTROABS 11.0*  --   --   --   --   HGB 8.3* 6.6* 6.8* 7.8* 8.2*  HCT 25.2* 19.2* 20.8* 23.6* 24.6*  MCV 83.7 83.8 82.9  --  84.2  PLT 428* 498* 475*  --  455*   Iron/TIBC/Ferritin/ %Sat    Component Value Date/Time   IRON 74 03/22/2016 1835   TIBC 165 (L) 03/22/2016 1835   FERRITIN 457 (H) 03/22/2016 1835   IRONPCTSAT 45 (H) 03/22/2016 1835

## 2016-05-05 DIAGNOSIS — Z7189 Other specified counseling: Secondary | ICD-10-CM

## 2016-05-05 DIAGNOSIS — Z515 Encounter for palliative care: Secondary | ICD-10-CM

## 2016-05-05 DIAGNOSIS — N186 End stage renal disease: Secondary | ICD-10-CM

## 2016-05-05 LAB — CBC
HEMATOCRIT: 26.7 % — AB (ref 36.0–46.0)
HEMOGLOBIN: 8.6 g/dL — AB (ref 12.0–15.0)
MCH: 27.7 pg (ref 26.0–34.0)
MCHC: 32.2 g/dL (ref 30.0–36.0)
MCV: 85.9 fL (ref 78.0–100.0)
Platelets: 511 10*3/uL — ABNORMAL HIGH (ref 150–400)
RBC: 3.11 MIL/uL — AB (ref 3.87–5.11)
RDW: 14.3 % (ref 11.5–15.5)
WBC: 11.9 10*3/uL — ABNORMAL HIGH (ref 4.0–10.5)

## 2016-05-05 LAB — BASIC METABOLIC PANEL
ANION GAP: 22 — AB (ref 5–15)
BUN: 132 mg/dL — ABNORMAL HIGH (ref 6–20)
CO2: 25 mmol/L (ref 22–32)
Calcium: 8.8 mg/dL — ABNORMAL LOW (ref 8.9–10.3)
Chloride: 95 mmol/L — ABNORMAL LOW (ref 101–111)
Creatinine, Ser: 7.22 mg/dL — ABNORMAL HIGH (ref 0.44–1.00)
GFR, EST AFRICAN AMERICAN: 7 mL/min — AB (ref >60)
GFR, EST NON AFRICAN AMERICAN: 6 mL/min — AB (ref >60)
GLUCOSE: 170 mg/dL — AB (ref 65–99)
POTASSIUM: 3.3 mmol/L — AB (ref 3.5–5.1)
Sodium: 142 mmol/L (ref 135–145)

## 2016-05-05 LAB — GLUCOSE, CAPILLARY
GLUCOSE-CAPILLARY: 184 mg/dL — AB (ref 65–99)
GLUCOSE-CAPILLARY: 170 mg/dL — AB (ref 65–99)
GLUCOSE-CAPILLARY: 189 mg/dL — AB (ref 65–99)
Glucose-Capillary: 169 mg/dL — ABNORMAL HIGH (ref 65–99)

## 2016-05-05 LAB — RENAL FUNCTION PANEL
ANION GAP: 21 — AB (ref 5–15)
Albumin: 2 g/dL — ABNORMAL LOW (ref 3.5–5.0)
BUN: 131 mg/dL — ABNORMAL HIGH (ref 6–20)
CO2: 26 mmol/L (ref 22–32)
Calcium: 8.9 mg/dL (ref 8.9–10.3)
Chloride: 95 mmol/L — ABNORMAL LOW (ref 101–111)
Creatinine, Ser: 7.18 mg/dL — ABNORMAL HIGH (ref 0.44–1.00)
GFR calc non Af Amer: 6 mL/min — ABNORMAL LOW (ref >60)
GFR, EST AFRICAN AMERICAN: 7 mL/min — AB (ref >60)
GLUCOSE: 163 mg/dL — AB (ref 65–99)
PHOSPHORUS: 7.6 mg/dL — AB (ref 2.5–4.6)
POTASSIUM: 3.3 mmol/L — AB (ref 3.5–5.1)
Sodium: 142 mmol/L (ref 135–145)

## 2016-05-05 MED ORDER — POTASSIUM CHLORIDE CRYS ER 20 MEQ PO TBCR
40.0000 meq | EXTENDED_RELEASE_TABLET | Freq: Once | ORAL | Status: AC
  Administered 2016-05-05: 40 meq via ORAL
  Filled 2016-05-05: qty 2

## 2016-05-05 NOTE — Progress Notes (Addendum)
[facility] Progress Note   Subjective: 2.3 L UOP yesterday  Vitals:   05/04/16 0923 05/04/16 2152 05/05/16 0431 05/05/16 0552  BP: 136/67 116/73  (!) 108/93  Pulse: 88 90  91  Resp: '18 18  18  '$ Temp: 98.7 F (37.1 C) 98.5 F (36.9 C)  98.4 F (36.9 C)  TempSrc: Oral Oral  Oral  SpO2: 96% 97%  97%  Weight:  (!) 158.8 kg (350 lb) (!) 161.9 kg (357 lb)     Inpatient medications: . amLODipine  10 mg Oral Daily  . atorvastatin  20 mg Oral Daily  . carvedilol  25 mg Oral BID WC  . cyanocobalamin  1,000 mcg Subcutaneous Daily  . doxycycline  100 mg Oral Q12H  . ferrous sulfate  325 mg Oral TID WC  . furosemide  160 mg Intravenous Q6H  . heparin  5,000 Units Subcutaneous Q8H  . hydrALAZINE  100 mg Oral TID  . insulin aspart  0-5 Units Subcutaneous QHS  . insulin aspart  0-9 Units Subcutaneous TID WC  . isosorbide mononitrate  30 mg Oral Daily  . metolazone  10 mg Oral BID  . multivitamin with minerals  1 tablet Oral Daily    acetaminophen, albuterol, camphor-menthol, guaiFENesin-dextromethorphan, HYDROcodone-acetaminophen, ondansetron **OR** ondansetron (ZOFRAN) IV  Exam: Gen morbidly obese, awake and alert No rash, cyanosis or gangrene Sclera anicteric, throat clear  No jvd or bruits Chest clear ant/lat, some scattered rales at the bases, no wheezing RRR no MRG Abd soft ntnd no mass or ascites +bs GU foley w/o much urine in the bag MS no joint effusions or deformity Ext diffuse 2+ pitting edema of arms and lower legs Neuro is alert, Ox 3 , nf, gen'd weakness, mod to severe    Assessment: 1. 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  Creat increasing from 4-6 from Oct '17 - Feb '18.  Now creat 7-8 range.  Came in w sig fluid overload, pulm edema on CXR, sig body edema. Diuresing somewhat and SOB is better.  Wt's going up but doubt accuracy.  Per discussion w/ husband pt has been bedridden for some time but not sure how  long.  Met with palliative care and pt/ husband. Pt / husband are optimistic that with improvement of her edema she would be able to get OOB, transfer to [facility], etc..  Not sure if that is a realistic goal or not. For now will continue diuresing, get PT input.  Will follow.      2. SOB - due to #2, improving 3. Anemia - of CKD +/- other, sp prbc's 4. Morbid obesity 5. Debility - as above 6. Hist CVA  Plan - as above   Kelly Splinter MD [facility] pager [phone]   05/05/2016, 9:47 AM    Recent Labs Lab 05/02/16 0555 05/03/16 0533 05/03/16 1520 05/05/16 0543  NA 140  --  138 142  142  K 3.9  --  3.2* 3.3*  3.3*  CL 99*  --  100* 95*  95*  CO2 23  --  '24 26  25  '$ GLUCOSE 120*  --  140* 163*  170*  BUN 108*  --  117* 131*  132*  CREATININE 7.90* 7.84* 7.65* 7.18*  7.22*  CALCIUM 8.4*  --  8.2* 8.9  8.8*  PHOS  --   --  8.8* 7.6*    Recent Labs Lab 05/01/16 1218 05/02/16 0555 05/03/16 1520 05/05/16  0543  AST 9* 10*  --   --   ALT 8* 7*  --   --   ALKPHOS 65 60  --   --   BILITOT 0.9 1.0  --   --   PROT 7.9 7.1  --   --   ALBUMIN 2.4* 2.2* 2.1* 2.0*    Recent Labs Lab 05/01/16 1218  05/02/16 0830 05/02/16 1830 05/03/16 0533 05/05/16 0543  WBC 14.0*  < > 13.4*  --  12.2* 11.9*  NEUTROABS 11.0*  --   --   --   --   --   HGB 8.3*  < > 6.8* 7.8* 8.2* 8.6*  HCT 25.2*  < > 20.8* 23.6* 24.6* 26.7*  MCV 83.7  < > 82.9  --  84.2 85.9  PLT 428*  < > 475*  --  455* 511*  < > = values in this interval not displayed. Iron/TIBC/Ferritin/ %Sat    Component Value Date/Time   IRON 74 03/22/2016 1835   TIBC 165 (L) 03/22/2016 1835   FERRITIN 457 (H) 03/22/2016 1835   IRONPCTSAT 45 (H) 03/22/2016 1835

## 2016-05-05 NOTE — Progress Notes (Signed)
Entered in the patient's room with the nurse assistance to do peri,foley care and dressing change.When the nurse tech  was trying straight out the foley catheter tube  from patient's  left leg,she started to moaned and groaned.Patient's husband said  '' are you hurting her" ? Nurse said ,'It would have some discomfort but remember I gave her pain medication earlier ,so it would not be much pain and besides we are trying to do it slowly with care knowing that it would be uncomfortable to her'.(Patient is morbid obese.I continued to do my dressing ,patient still moaning in a soft ,low modulated voice then suddenly the patient's husband said that "you know I have a gun"  Nurse responded that '' we are doing what we need to be done to her ,we' are not hurting her.Nurse tech also said to him that " and don't say that we are taking that seriously here''.Patient's husband said ''i'm joking you all". Me and the nurse continue working on the patient but wary of him for any usual words or behavior that may come from him.Nurse and nurse tech stay in the room until such time we both done our work on the patient.

## 2016-05-05 NOTE — Progress Notes (Signed)
[facility]                                                                              Patient Demographics  [name], is a 41 y.o. female, DOB - Jan 17, 1976, ZOX:[id number]  Admit date - 05/01/2016   Admitting Physician Costin Otelia Sergeant, MD  Outpatient Primary MD for the patient is Marletta Lor, NP  Outpatient specialists:   LOS - 4  days    Chief Complaint  Patient presents with  . wounds       Brief summary  41 y.o.femalewith medical history significant for morbid obesity, chronic kidney disease stage IV-V, diabetes mellitus, hypertension, prior CVA, diastolic CHF who presented to ED with progressive weakness, fever for past 1 week prior to this admission. She is actually bed bound for past year or so per her husband report ever since stroke. She also had dry cough and shortness of breath for past 1 week. She was hemodynamically stable on admission. Her creatinine was 7.9 (much worse since her recent Cr values of 4-5). Renal has seen her in consultation. Due to her debility she is not a good candidate for HD. Palliative care was consulted as well.      Assessment & Plan   Acute metabolic encephalopathy - Likely multifactorial from history of CVA, end stage renal disease, possible uremia, acute infectious process - possible pneumonia - No significant changes in mental status   Acute renal failure superimposed on CKD stage 5 not on HD / Acute pulmonary edema  - Due to progressive disease from uncontrolled diabetes and FSG - Cr continues to trend up, 7.65 this am - Appreciate renal following - Per renal, pt not a good candidate for HD because she is not able to get up from the bed. She has been bed bound for the past year or so after the stroke - Palliative medicine consulted for goals of care - Continue IV lasix and metolazone per renal recommendations   Acute respiratory failure with hypoxia / Right middle lobe pneumonia / Leukocytosis    - CT chest without contrast showed linear density over the right middle lobe likely atelectasis, although cannot completely exclude infection - She is on empiric doxycycline  - Blood cultures 1/2 showed colitis negative staph likely contaminant   Hypokalemia - Due to lasix - Supplemented  Anemia of chronic disease / IDA - Due to CKD - Status post 2 units PRBC transfusion on 05/02/2016  - Aranesp 3/22, continue iron supplementation, B12  Uncontrolled diabetes mellitus with diabetic nephropathy with long term insulin use - Continue current insulin regimen: SSI  Dyslipidemia associated with type 2 DM - Continue statin therapy   Essential hypertension - Continue Norvasc, coreg, hydralazine, Imdur   Bilateral inner thigh wounds - Related to moisture and friction from thighs rubbing together - Left inner thigh has large area with many open areas scattered throughout, 17cm x 12cm x 0.1cm  - Right inner thigh wound is 8cm x 5cm x 0.3cm  - Dressing procedure/placement/frequency: Cleanse with NS, gently pat dry, apply Xeroform gauze, top with dry gauze,change every shift. Pt is already on a low  air loss mattress  Morbid obesity / Debility  - Nutrition consulted - Body mass index is 54.58 kg/m.  Code Status: full  DVT Prophylaxis:  heparin  Family Communication: Discussed in detail with the patient, all imaging results, lab results explained to the patient    Disposition Plan:   Time Spent in minutes 25 minutes  Procedures:    Consultants:    NephrologyDr. Delano Metzobert [place]hertz  WOC  Palliative care   Nutrition  Antimicrobials:  Doxycycline 3/21->   Medications  Scheduled Meds: . amLODipine  10 mg Oral Daily  . atorvastatin  20 mg Oral Daily  . carvedilol  25 mg Oral BID WC  . cyanocobalamin  1,000 mcg Subcutaneous Daily  . doxycycline  100 mg Oral Q12H  . ferrous sulfate  325 mg Oral TID WC  . furosemide  160 mg Intravenous Q6H  . heparin  5,000 Units  Subcutaneous Q8H  . hydrALAZINE  100 mg Oral TID  . insulin aspart  0-5 Units Subcutaneous QHS  . insulin aspart  0-9 Units Subcutaneous TID WC  . isosorbide mononitrate  30 mg Oral Daily  . metolazone  10 mg Oral BID  . multivitamin with minerals  1 tablet Oral Daily   Continuous Infusions: PRN Meds:.acetaminophen, albuterol, camphor-menthol, guaiFENesin-dextromethorphan, HYDROcodone-acetaminophen, ondansetron **OR** ondansetron (ZOFRAN) IV   Antibiotics   Anti-infectives    Start     Dose/Rate Route Frequency Ordered Stop   05/02/16 2200  doxycycline (VIBRA-TABS) tablet 100 mg     100 mg Oral Every 12 hours 05/02/16 1732     05/01/16 2200  piperacillin-tazobactam (ZOSYN) IVPB 2.25 g  Status:  Discontinued     2.25 g 100 mL/hr over 30 Minutes Intravenous Every 8 hours 05/01/16 1324 05/02/16 1729   05/01/16 1330  vancomycin (VANCOCIN) 2,500 mg in sodium chloride 0.9 % 500 mL IVPB     2,500 mg 250 mL/hr over 120 Minutes Intravenous  Once 05/01/16 1309 05/01/16 1551   05/01/16 1300  piperacillin-tazobactam (ZOSYN) IVPB 3.375 g     3.375 g 100 mL/hr over 30 Minutes Intravenous  Once 05/01/16 1258 05/01/16 1340   05/01/16 1300  vancomycin (VANCOCIN) IVPB 1000 mg/200 mL premix  Status:  Discontinued     1,000 mg 200 mL/hr over 60 Minutes Intravenous  Once 05/01/16 1258 05/01/16 1308        Subjective:   [name] was seen and examined today.  Patient denies dizziness, chest pain, shortness of breath, abdominal pain, N/V/D/C, new weakness, numbess, tingling. No acute events overnight.    Objective:   Vitals:   05/04/16 2152 05/05/16 0431 05/05/16 0552 05/05/16 1000  BP: 116/73  (!) 108/93 (!) 150/76  Pulse: 90  91 88  Resp: 18  18 20   Temp: 98.5 F (36.9 C)  98.4 F (36.9 C) 98.4 F (36.9 C)  TempSrc: Oral  Oral Oral  SpO2: 97%  97% 98%  Weight: (!) 158.8 kg (350 lb) (!) 161.9 kg (357 lb)      Intake/Output Summary (Last 24 hours) at 05/05/16 1243 Last  data filed at 05/05/16 1131  Gross per 24 hour  Intake              180 ml  Output             2500 ml  Net            -2320 ml     Wt Readings from Last 3 Encounters:  05/05/16 (!) 161.9  kg (357 lb)  03/29/16 (!) 167 kg (368 lb 3.2 oz)  03/09/16 (!) 168.3 kg (371 lb)     Exam  General: Alert andComfortable  HEENT:  PERRLA, EOMI, Anicteric Sclera, mucous membranes moist.   Neck: Supple, no JVD, no masses  Cardiovascular: S1 S2 auscultated, no rubs, murmurs or gallops. Regular rate and rhythm.  Respiratory: Clear to auscultation bilaterally, no wheezing, rales or rhonchi  Gastrointestinal: Soft, nontender, nondistended, + bowel sounds  Ext: no cyanosis clubbing or edema, dressing in place on the thigh  Neuro: no new deficits  Skin: No rashes  Psych: Normal affect and demeanor, alert and oriented x3    Data Reviewed:  I have personally reviewed following labs and imaging studies  Micro Results Recent Results (from the past 240 hour(s))  Urine culture     Status: None   Collection Time: 05/01/16 11:44 AM  Result Value Ref Range Status   Specimen Description URINE, RANDOM  Final   Special Requests NONE  Final   Culture   Final    NO GROWTH Performed at [facility], [address] [place], [place] [address]    Report Status 05/02/2016 FINAL  Final  Blood Culture (routine x 2)     Status: Abnormal   Collection Time: 05/01/16 12:20 PM  Result Value Ref Range Status   Specimen Description BLOOD LEFT ANTECUBITAL  Final   Special Requests BOTTLES DRAWN AEROBIC AND ANAEROBIC 5CC  Final   Culture  Setup Time   Final    GRAM POSITIVE COCCI IN CLUSTERS AEROBIC BOTTLE ONLY CRITICAL RESULT CALLED TO, READ BACK BY AND VERIFIED WITH: T GREEN PHARMD [id number] 05/02/16 A BROWNING    Culture (A)  Final    STAPHYLOCOCCUS SPECIES (COAGULASE NEGATIVE) THE SIGNIFICANCE OF ISOLATING THIS ORGANISM FROM A SINGLE SET OF BLOOD CULTURES WHEN MULTIPLE SETS ARE DRAWN IS UNCERTAIN.  PLEASE NOTIFY THE MICROBIOLOGY DEPARTMENT WITHIN ONE WEEK IF SPECIATION AND SENSITIVITIES ARE REQUIRED. Performed at [facility], [address] [place], [place] [address]    Report Status 05/04/2016 FINAL  Final  Blood Culture ID Panel (Reflexed)     Status: None   Collection Time: 05/01/16 12:20 PM  Result Value Ref Range Status   Enterococcus species NOT DETECTED NOT DETECTED Final   Listeria monocytogenes NOT DETECTED NOT DETECTED Final   Staphylococcus species NOT DETECTED NOT DETECTED Final   Staphylococcus aureus NOT DETECTED NOT DETECTED Final   Streptococcus species NOT DETECTED NOT DETECTED Final   Streptococcus agalactiae NOT DETECTED NOT DETECTED Final   Streptococcus pneumoniae NOT DETECTED NOT DETECTED Final   Streptococcus pyogenes NOT DETECTED NOT DETECTED Final   Acinetobacter baumannii NOT DETECTED NOT DETECTED Final   Enterobacteriaceae species NOT DETECTED NOT DETECTED Final   Enterobacter cloacae complex NOT DETECTED NOT DETECTED Final   Escherichia coli NOT DETECTED NOT DETECTED Final   Klebsiella oxytoca NOT DETECTED NOT DETECTED Final   Klebsiella pneumoniae NOT DETECTED NOT DETECTED Final   Proteus species NOT DETECTED NOT DETECTED Final   Serratia marcescens NOT DETECTED NOT DETECTED Final   Haemophilus influenzae NOT DETECTED NOT DETECTED Final   Neisseria meningitidis NOT DETECTED NOT DETECTED Final   Pseudomonas aeruginosa NOT DETECTED NOT DETECTED Final   Candida albicans NOT DETECTED NOT DETECTED Final   Candida glabrata NOT DETECTED NOT DETECTED Final   Candida krusei NOT DETECTED NOT DETECTED Final   Candida parapsilosis NOT DETECTED NOT DETECTED Final   Candida tropicalis NOT DETECTED NOT  DETECTED Final    Comment: Performed at [facility], [address] [place], [place] [address]  Blood Culture (routine x 2)     Status: None (Preliminary result)   Collection Time: 05/01/16 12:26 PM  Result Value Ref Range Status   Specimen  Description BLOOD LEFT HAND  Final   Special Requests BOTTLES DRAWN AEROBIC AND ANAEROBIC 5CC  Final   Culture   Final    NO GROWTH 3 DAYS Performed at [facility], [address], [place], [place] [address]    Report Status PENDING  Incomplete    Radiology Reports Dg Chest 2 View  Result Date: 05/01/2016 CLINICAL DATA:  Fever and weakness. EXAM: CHEST  2 VIEW COMPARISON:  03/22/2016. FINDINGS: Cardiomegaly with mild bilateral interstitial prominence. No pleural effusion or pneumothorax. No acute bony abnormality. IMPRESSION: Cardiomegaly with mild bilateral interstitial prominence consistent with CHF. Findings have improved from prior study of 03/22/2016. Given patient's history of fever pneumonitis cannot be excluded. Electronically Signed   By: Maisie Fus  Register   On: 05/01/2016 13:30   Ct Chest Wo Contrast  Result Date: 05/02/2016 CLINICAL DATA:  Morbid obesity week chronic kidney disease and diabetes. Complains of weakness and fever over the past week. Cough. EXAM: CT CHEST WITHOUT CONTRAST TECHNIQUE: Multidetector CT imaging of the chest was performed following the standard protocol without IV contrast. COMPARISON:  Chest x-ray 05/01/2016 FINDINGS: Cardiovascular: Mild to moderate cardiomegaly. Remaining vascular structures are unremarkable. Mediastinum/Nodes: No definite mediastinal or hilar adenopathy. Remaining mediastinal structures are unremarkable. Lungs/Pleura: Lungs are somewhat hypoinflated and demonstrate mild bibasilar linear atelectasis. Linear density over the posterior and anterior right middle lobe likely atelectasis. No evidence of effusion. Airways are unremarkable. Upper Abdomen: Mild calcified plaque over the celiac axis, splenic artery and superior mesenteric arteries. Musculoskeletal: Minimal degenerative change of the spine. Prominent overlying soft tissues. IMPRESSION: Linear density over the right middle lobe likely atelectasis, although cannot completely exclude  infection. Minimal linear bibasilar atelectasis. Moderate cardiomegaly. Electronically Signed   By: Elberta Fortis M.D.   On: 05/02/2016 15:53    Lab Data:  CBC:  Recent Labs Lab 05/01/16 1218 05/02/16 0555 05/02/16 0830 05/02/16 1830 05/03/16 0533 05/05/16 0543  WBC 14.0* 13.1* 13.4*  --  12.2* 11.9*  NEUTROABS 11.0*  --   --   --   --   --   HGB 8.3* 6.6* 6.8* 7.8* 8.2* 8.6*  HCT 25.2* 19.2* 20.8* 23.6* 24.6* 26.7*  MCV 83.7 83.8 82.9  --  84.2 85.9  PLT 428* 498* 475*  --  455* 511*   Basic Metabolic Panel:  Recent Labs Lab 05/01/16 1218 05/02/16 0555 05/03/16 0533 05/03/16 1520 05/05/16 0543  NA 140 140  --  138 142  142  K 3.5 3.9  --  3.2* 3.3*  3.3*  CL 98* 99*  --  100* 95*  95*  CO2 23 23  --  24 26  25   GLUCOSE 102* 120*  --  140* 163*  170*  BUN 98* 108*  --  117* 131*  132*  CREATININE 7.96* 7.90* 7.84* 7.65* 7.18*  7.22*  CALCIUM 8.7* 8.4*  --  8.2* 8.9  8.8*  PHOS  --   --   --  8.8* 7.6*   GFR: Estimated Creatinine Clearance: 16.7 mL/min (A) (by C-G formula based on SCr of 7.18 mg/dL (H)). Liver Function Tests:  Recent Labs Lab 05/01/16 1218 05/02/16 0555 05/03/16 1520 05/05/16 0543  AST 9* 10*  --   --  ALT 8* 7*  --   --   ALKPHOS 65 60  --   --   BILITOT 0.9 1.0  --   --   PROT 7.9 7.1  --   --   ALBUMIN 2.4* 2.2* 2.1* 2.0*   No results for input(s): LIPASE, AMYLASE in the last 168 hours. No results for input(s): AMMONIA in the last 168 hours. Coagulation Profile: No results for input(s): INR, PROTIME in the last 168 hours. Cardiac Enzymes: No results for input(s): CKTOTAL, CKMB, CKMBINDEX, TROPONINI in the last 168 hours. BNP (last 3 results) No results for input(s): PROBNP in the last 8760 hours. HbA1C: No results for input(s): HGBA1C in the last 72 hours. CBG:  Recent Labs Lab 05/04/16 1217 05/04/16 1706 05/04/16 2150 05/05/16 0732 05/05/16 1150  GLUCAP 144* 161* 174* 169* 184*   Lipid Profile: No results for  input(s): CHOL, HDL, LDLCALC, TRIG, CHOLHDL, LDLDIRECT in the last 72 hours. Thyroid Function Tests: No results for input(s): TSH, T4TOTAL, FREET4, T3FREE, THYROIDAB in the last 72 hours. Anemia Panel:  Recent Labs  05/03/16 0533  VITAMINB12 222   Urine analysis:    Component Value Date/Time   COLORURINE STRAW (A) 05/01/2016 1144   APPEARANCEUR CLEAR 05/01/2016 1144   LABSPEC 1.009 05/01/2016 1144   PHURINE 5.0 05/01/2016 1144   GLUCOSEU NEGATIVE 05/01/2016 1144   HGBUR SMALL (A) 05/01/2016 1144   BILIRUBINUR NEGATIVE 05/01/2016 1144   KETONESUR NEGATIVE 05/01/2016 1144   PROTEINUR 30 (A) 05/01/2016 1144   UROBILINOGEN 0.2 10/26/2013 1721   NITRITE NEGATIVE 05/01/2016 1144   LEUKOCYTESUR NEGATIVE 05/01/2016 1144     RAI,RIPUDEEP M.D. [facility] 05/05/2016, 12:43 PM  Pager: [phone] Between 7am to 7pm - call Pager - [phone]  After 7pm go to [email or web address] - password [email or web address]  Call night coverage person covering after 7pm

## 2016-05-05 NOTE — Consult Note (Signed)
Consultation Note Date: 05/05/2016   Patient Name: [name]  DOB: [birth year]  MRN: [id number]  Age / Sex: 41 y.o., female  PCP: Marletta Lor, NP Referring Physician: Cathren Harsh, MD  Reason for Consultation: Disposition, Establishing goals of care and Psychosocial/spiritual support  HPI/Patient Profile: 41 y.o. female  with past medical history of CKD stage V, DM2, CVA with residual right sided weakness, dCHF, morbid obesity, and HTN. She presented to the ED after progressive weakness over the prior week, to the point of being unable to get OOB. In the ED she was febrile with elevated WBC, CXR with fluid overload versus pneumonitis, and creatinine 7.96 (baseline 4). She was subsequently admitted on 05/01/2016 with respiratory illness and Acute kidney injury in the setting of CKD. Nephrology consulted and felt she was volume overloaded and have been working to [employer]. The possibility of dialysis was raised given her progressive kidney disease, however unclear on pt's mobility (cannot do OP HD if bed-bound). Palliative consulted to assist in goals of care.   Clinical Assessment and Goals of Care: I met with [name] and her husband [place] at her bedside. Dr. Arta Silence with Nephrology was also present and had asked to participate in the goals of care conversation. Dr. Arta Silence explained Amoy's kidney issues, specifically educating that the cause was her diabetes, and the decline had and will continue to progress. He also explained that dialysis was the only option for management. Her current functional status and overall health make her a poor long term dialysis candidate.  In exploring this information with [place] and her husband, they were both quick to tell me that she is young and will do anything necessary to both qualify and proceed with dialysis. Her focus is on pursuing any available life prolonging measures in order to be around for her children  (ages 56, 60, and 3). She also told me that she would rather live longer than better, and would continue to choose life even if she was in pain or exhausted. Her husband agrees with this sentiment and shares that their children are the most important things in their life (above even their health and comfort).  In proceeding forward, I explained that her functional status currently precludes her from dialysis, specifically that she is bed-bound. Her oral intake is also poor, with resultant malnutrition. She now expresses a commitment to better eating as she didn't realize how serious her situation was. She also relates that she can get out of bed, take a few steps, and pivot to a chair. She wanted to demonstrate that since she has been here, however was limited due to environmental factors (including bariatric bed surface being too slippery). PT will need to see her again and help clarify her mobility status.   In our conversation I brought up the possibilty of her kidneys worsening faster than her strength returning. If this were to happen and dialysis was not an option, the focus would shift on comfort and recognizing her time was limited. She acknowledged this possibility, but was vocal about her capacity to move and eat better. Finally, I did gently bring up code status. Sade burst into tears and stated "I just can't think about these things now. It's too much and I'm not ready." We gently discussed starting to think about the different paths the future could hold, but could not move beyond that point.   Primary Decision Maker PATIENT   SUMMARY OF RECOMMENDATIONS    Full code, she wants to pursue  any and all life prolonging measures  PT contacted, we need another assessment given the importance of clarifying her mobility status  Pt/husband open to SNF if in [place], otherwise they want to return home at d/c  Code Status/Advance Care Planning:  Full code  Symptom Management:   Pt denies  uncontrolled symptoms, she relates that she feels tired and weak   Palliative Prophylaxis:   Bowel Regimen, Palliative Wound Care and Turn Reposition  Additional Recommendations (Limitations, Scope, Preferences):  Full Scope Treatment  Psycho-social/Spiritual:   Desire for further Chaplaincy support:no  Additional Recommendations: TBD  Prognosis:   Unable to determine. If pt is determined to not be eligible for dialysis, I would expect <4mogiven significant volume overload issues, poor functional status, and progressively worsening renal function.   Discharge Planning: To Be Determined      Primary Diagnoses: Present on Admission: . (Resolved) HCAP (healthcare-associated pneumonia) . Anemia of chronic disease . (Resolved) Cellulitis of right lower extremity . Debility . Essential hypertension . Morbid obesity ([place] . Acute metabolic encephalopathy . Acute renal failure superimposed on stage 5 chronic kidney disease, not on chronic dialysis ([place] . Acute pulmonary edema (HCC) . Acute respiratory failure with hypoxia ([facility] . Right middle lobe pneumonia ([facility] . Leukocytosis . Dyslipidemia associated with type 2 diabetes mellitus ([facility] [place]   I have reviewed the medical record, interviewed the patient and family, and examined the patient. The following aspects are pertinent.  Past Medical History:  Diagnosis Date  . Anasarca 02/05/2015  . Asthma   . CHF (congestive heart failure) (HCC)    diastolic  . CVA (cerebral infarction) 1990's   "writing is not the same since" R leg weakness   . GERD (gastroesophageal reflux disease)   . Hypertension   . Morbid obesity (HT[place]   . Palliative care encounter   . Stroke ([place]   . Type II diabetes mellitus ([facility]    Social History   Social History  . Marital status: Married    Spouse name: N/A  . Number of children: N/A  . Years of education: N/A   Social History Main Topics  . Smoking status: Never Smoker  . Smokeless  tobacco: Never Used  . Alcohol use No  . Drug use: No  . Sexual activity: Not Asked   Other Topics Concern  . None   Social History Narrative  . None   Family History  Problem Relation Age of Onset  . Diabetes Mother   . Kidney disease Mother    Scheduled Meds: . amLODipine  10 mg Oral Daily  . atorvastatin  20 mg Oral Daily  . carvedilol  25 mg Oral BID WC  . cyanocobalamin  1,000 mcg Subcutaneous Daily  . doxycycline  100 mg Oral Q12H  . ferrous sulfate  325 mg Oral TID WC  . furosemide  160 mg Intravenous Q6H  . heparin  5,000 Units Subcutaneous Q8H  . hydrALAZINE  100 mg Oral TID  . insulin aspart  0-5 Units Subcutaneous QHS  . insulin aspart  0-9 Units Subcutaneous TID WC  . isosorbide mononitrate  30 mg Oral Daily  . metolazone  10 mg Oral BID  . multivitamin with minerals  1 tablet Oral Daily   Continuous Infusions: PRN Meds:.acetaminophen, albuterol, camphor-menthol, guaiFENesin-dextromethorphan, HYDROcodone-acetaminophen, ondansetron **OR** ondansetron (ZOFRAN) IV Allergies  Allergen Reactions  . Azithromycin Other (See Comments)    Nose bleeding event   Review of Systems  Constitutional: Positive for activity change, appetite change, fatigue  and unexpected weight change (progressive weight loss).  HENT: Negative for congestion, hearing loss, sore throat and trouble swallowing.   Eyes: Negative for visual disturbance.  Respiratory: Positive for cough and shortness of breath. Negative for chest tightness.   Cardiovascular: Positive for leg swelling (improved since admission). Negative for chest pain.  Gastrointestinal: Negative for abdominal distention, abdominal pain, constipation, nausea and vomiting.  Genitourinary: Negative for flank pain.  Musculoskeletal: Positive for gait problem. Negative for back pain.  Neurological: Positive for weakness and numbness (chronic). Negative for dizziness, speech difficulty and light-headedness.  Psychiatric/Behavioral:  Positive for confusion (now back to baseline, per husband) and decreased concentration. The patient is nervous/anxious.    Physical Exam  Constitutional: She is oriented to person, place, and time. Vital signs are normal. She has a sickly appearance.  Morbidly obese woman lying in bed  HENT:  Head: Normocephalic and atraumatic.  Mouth/Throat: Oropharynx is clear and moist. No oropharyngeal exudate.  Eyes: EOM are normal.  Neck: Normal range of motion.  Cardiovascular: Normal rate and regular rhythm.   Pulmonary/Chest: No tachypnea. No respiratory distress. She has decreased breath sounds in the right lower field and the left lower field. She has no wheezes. She has rhonchi.  Abdominal: Soft. Bowel sounds are normal.  Musculoskeletal: She exhibits edema (edematous everywhere).  Limited ROM d/t weakness and body habitus  Neurological: She is alert and oriented to person, place, and time.  Skin: Skin is warm and dry.  Psychiatric: Judgment and thought content normal. Her affect is labile. Her speech is delayed. She is slowed and withdrawn. She exhibits abnormal recent memory.    Vital Signs: BP (!) 108/93 (BP Location: Right Arm)   Pulse 91   Temp 98.4 F (36.9 C) (Oral)   Resp 18   Wt (!) 161.9 kg (357 lb)   LMP 04/11/2016   SpO2 97%   BMI 57.62 kg/m  Pain Assessment: 0-10   Pain Score: 2    SpO2: SpO2: 97 % O2 Device:SpO2: 97 % O2 Flow Rate: .O2 Flow Rate (L/min): 3 L/min  IO: Intake/output summary:  Intake/Output Summary (Last 24 hours) at 05/05/16 0834 Last data filed at 05/05/16 0559  Gross per 24 hour  Intake              300 ml  Output             2350 ml  Net            -2050 ml    LBM: Last BM Date: 05/02/16 Baseline Weight: Weight: (!) 144.2 kg (318 lb) Most recent weight: Weight: (!) 161.9 kg (357 lb)     Palliative Assessment/Data: PPS 30%; pt reports that she can get OOB, however I have not seen this. She has required full support for even bed tasks.      Time Total: 50 minutes Greater than 50%  of this time was spent counseling and coordinating care related to the above assessment and plan.  Signed by: Charlynn Court, NP Palliative Medicine Team Pager # [phone] (M-F 7a-5p) Team Phone # [phone] (Nights/Weekends)

## 2016-05-06 ENCOUNTER — Inpatient Hospital Stay (HOSPITAL_COMMUNITY): Payer: Medicaid Other

## 2016-05-06 DIAGNOSIS — Z7189 Other specified counseling: Secondary | ICD-10-CM

## 2016-05-06 LAB — CULTURE, BLOOD (ROUTINE X 2): Culture: NO GROWTH

## 2016-05-06 LAB — RENAL FUNCTION PANEL
Albumin: 2.1 g/dL — ABNORMAL LOW (ref 3.5–5.0)
Anion gap: 20 — ABNORMAL HIGH (ref 5–15)
BUN: 130 mg/dL — ABNORMAL HIGH (ref 6–20)
CALCIUM: 8.7 mg/dL — AB (ref 8.9–10.3)
CHLORIDE: 97 mmol/L — AB (ref 101–111)
CO2: 25 mmol/L (ref 22–32)
CREATININE: 7.01 mg/dL — AB (ref 0.44–1.00)
GFR calc non Af Amer: 7 mL/min — ABNORMAL LOW (ref >60)
GFR, EST AFRICAN AMERICAN: 8 mL/min — AB (ref >60)
GLUCOSE: 173 mg/dL — AB (ref 65–99)
Phosphorus: 7.2 mg/dL — ABNORMAL HIGH (ref 2.5–4.6)
Potassium: 3.4 mmol/L — ABNORMAL LOW (ref 3.5–5.1)
SODIUM: 142 mmol/L (ref 135–145)

## 2016-05-06 LAB — GLUCOSE, CAPILLARY
GLUCOSE-CAPILLARY: 161 mg/dL — AB (ref 65–99)
GLUCOSE-CAPILLARY: 174 mg/dL — AB (ref 65–99)
Glucose-Capillary: 174 mg/dL — ABNORMAL HIGH (ref 65–99)

## 2016-05-06 LAB — FOLATE RBC
FOLATE, HEMOLYSATE: 228.1 ng/mL
Folate, RBC: 898 ng/mL (ref >498)
HEMATOCRIT: 25.4 % — AB (ref 34.0–46.6)

## 2016-05-06 LAB — TROPONIN I: TROPONIN I: 0.04 ng/mL — AB (ref <0.03)

## 2016-05-06 LAB — HCG, SERUM, QUALITATIVE: Preg, Serum: NEGATIVE

## 2016-05-06 LAB — D-DIMER, QUANTITATIVE: D-Dimer, Quant: 2.01 ug/mL-FEU — ABNORMAL HIGH (ref 0.00–0.50)

## 2016-05-06 MED ORDER — DARBEPOETIN ALFA 150 MCG/0.3ML IJ SOSY
150.0000 ug | PREFILLED_SYRINGE | INTRAMUSCULAR | Status: DC
  Filled 2016-05-06: qty 0.3

## 2016-05-06 MED ORDER — TECHNETIUM TC 99M DIETHYLENETRIAME-PENTAACETIC ACID
32.6000 | Freq: Once | INTRAVENOUS | Status: AC | PRN
  Administered 2016-05-06: 32.6 via RESPIRATORY_TRACT

## 2016-05-06 MED ORDER — TECHNETIUM TO 99M ALBUMIN AGGREGATED
4.2000 | Freq: Once | INTRAVENOUS | Status: AC | PRN
  Administered 2016-05-06: 4.2 via INTRAVENOUS

## 2016-05-06 MED ORDER — POLYETHYLENE GLYCOL 3350 17 G PO PACK
17.0000 g | PACK | Freq: Every day | ORAL | Status: DC
Start: 2016-05-06 — End: 2016-05-14
  Administered 2016-05-06 – 2016-05-13 (×3): 17 g via ORAL
  Filled 2016-05-06 (×6): qty 1

## 2016-05-06 MED ORDER — SENNOSIDES-DOCUSATE SODIUM 8.6-50 MG PO TABS
1.0000 | ORAL_TABLET | Freq: Two times a day (BID) | ORAL | Status: DC
  Administered 2016-05-06 – 2016-05-30 (×34): 1 via ORAL
  Filled 2016-05-06 (×42): qty 1

## 2016-05-06 MED ORDER — DARBEPOETIN ALFA 150 MCG/0.3ML IJ SOSY
150.0000 ug | PREFILLED_SYRINGE | INTRAMUSCULAR | Status: DC
  Administered 2016-05-09: 150 ug via SUBCUTANEOUS
  Filled 2016-05-06: qty 0.3

## 2016-05-06 MED ORDER — NITROGLYCERIN 0.4 MG SL SUBL
0.4000 mg | SUBLINGUAL_TABLET | SUBLINGUAL | Status: DC | PRN
  Administered 2016-05-06: 0.4 mg via SUBLINGUAL
  Filled 2016-05-06: qty 1

## 2016-05-06 MED ORDER — CALCIUM ACETATE (PHOS BINDER) 667 MG PO CAPS
667.0000 mg | ORAL_CAPSULE | Freq: Three times a day (TID) | ORAL | Status: DC
  Administered 2016-05-07 – 2016-05-13 (×12): 667 mg via ORAL
  Filled 2016-05-06 (×15): qty 1

## 2016-05-06 MED ORDER — POTASSIUM CHLORIDE CRYS ER 20 MEQ PO TBCR
40.0000 meq | EXTENDED_RELEASE_TABLET | Freq: Every day | ORAL | Status: DC
  Administered 2016-05-06 – 2016-05-09 (×4): 40 meq via ORAL
  Administered 2016-05-11: 20 meq via ORAL
  Administered 2016-05-12: 40 meq via ORAL
  Filled 2016-05-06 (×8): qty 2

## 2016-05-06 MED ORDER — MORPHINE SULFATE (PF) 2 MG/ML IV SOLN
1.0000 mg | INTRAVENOUS | Status: DC | PRN
  Administered 2016-05-06 – 2016-05-17 (×6): 1 mg via INTRAVENOUS
  Filled 2016-05-06 (×6): qty 1

## 2016-05-06 NOTE — Progress Notes (Signed)
Patient currently in testing with echocardiogram. Will follow-up with completion of rehabilitation consult after testing completed

## 2016-05-06 NOTE — Progress Notes (Signed)
[name] Progress Note   Subjective:  Continues to make good urine 2.3-2.5 liters/day recorded through 3/25 - but not recorded today Stood up today with the help of PT She is determined that she will be a dialysis candidate (she is not over the weight limit max of around 400 and if can transfer, will be in her favor)  Vitals:   05/06/16 0111 05/06/16 0655 05/06/16 0948 05/06/16 1651  BP: (!) 143/68 (!) 141/64 115/82 105/88  Pulse: 87 88 94 87  Resp: 20 18 18 18   Temp: 97.7 F (36.5 C) 97.9 F (36.6 C) 98.5 F (36.9 C) 98.2 F (36.8 C)  TempSrc: Oral Oral Oral Oral  SpO2: 98% 99% 93% 95%  Weight:        Exam: Gen morbidly obese, awake and alert Speaks slowly Sclera anicteric, throat clear  No jvd or bruits Chest clear ant/lat, some scattered rales at the bases, no wheezing Rhythm regular,, normal heart sounds Abd soft ntnd no mass or ascites +bs Huge pannus Ext diffuse 2-3+ pitting edema of arms and lower legs Chronic skin changes/darkening of LE's Dressings on LE wounds   Inpatient medications: . amLODipine  10 mg Oral Daily  . atorvastatin  20 mg Oral Daily  . carvedilol  25 mg Oral BID WC  . doxycycline  100 mg Oral Q12H  . ferrous sulfate  325 mg Oral TID WC  . furosemide  160 mg Intravenous Q6H  . heparin  5,000 Units Subcutaneous Q8H  . hydrALAZINE  100 mg Oral TID  . insulin aspart  0-5 Units Subcutaneous QHS  . insulin aspart  0-9 Units Subcutaneous TID WC  . isosorbide mononitrate  30 mg Oral Daily  . metolazone  10 mg Oral BID  . multivitamin with minerals  1 tablet Oral Daily  . polyethylene glycol  17 g Oral Daily  . potassium chloride  40 mEq Oral Daily  . senna-docusate  1 tablet Oral BID     Recent Labs Lab 05/03/16 1520 05/05/16 0543 05/06/16 0213  NA 138 142  142 142  K 3.2* 3.3*  3.3* 3.4*  CL 100* 95*  95* 97*  CO2 24 26  25 25   GLUCOSE 140* 163*  170* 173*  BUN 117* 131*  132* 130*  CREATININE 7.65*  7.18*  7.22* 7.01*  CALCIUM 8.2* 8.9  8.8* 8.7*  PHOS 8.8* 7.6* 7.2*    Recent Labs Lab 05/01/16 1218 05/02/16 0555 05/03/16 1520 05/05/16 0543 05/06/16 0213  AST 9* 10*  --   --   --   ALT 8* 7*  --   --   --   ALKPHOS 65 60  --   --   --   BILITOT 0.9 1.0  --   --   --   PROT 7.9 7.1  --   --   --   ALBUMIN 2.4* 2.2* 2.1* 2.0* 2.1*    Recent Labs Lab 05/01/16 1218  05/02/16 0830 05/02/16 1830 05/03/16 0533 05/05/16 0543  WBC 14.0*  < > 13.4*  --  12.2* 11.9*  NEUTROABS 11.0*  --   --   --   --   --   HGB 8.3*  < > 6.8* 7.8* 8.2* 8.6*  HCT 25.2*  < > 20.8* 23.6* 24.6*  25.4* 26.7*  MCV 83.7  < > 82.9  --  84.2 85.9  PLT 428*  < > 475*  --  455* 511*  < > = values in  this interval not displayed.   Iron/TIBC/Ferritin/ %Sat    Component Value Date/Time   IRON 74 03/22/2016 1835   TIBC 165 (L) 03/22/2016 1835   FERRITIN 457 (H) 03/22/2016 1835   IRONPCTSAT 45 (H) 03/22/2016 1835    Assessment: 1. 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  Creat increasing from 4-6 from Oct '17 - Feb '18.  Now creat is in 7-8 range.  Admitted with sig fluid overload, anasarca/pulm edema on CXR. Came in w sig fluid overload, pulm edema on CXR. Diuresing somewhat and SOB is better. Per discussion w/ husband pt has been bedridden for some time but she says because of broken wheelchair - she worked with PT today and stood up (with much assist). She is determined that she will be a dialysis candidate. Pt / husband are optimistic that with improvement of her edema she would be able to get OOB, transfer to [facility], etc. All that said, with her very low GFR/volume overload - think we should move ahead with dialysis planning - will need TDC and AVF/AVG. V map ordered. Call [facility] in the AM.      2. SOB - improving 3. Anemia - of CKD +/- other; transfused 2 U on 3/22. Add darbe 150/week. 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 Phos up. Needs binders. Ca  acetate 1 c/meals. Renal diet. 5. Morbid obesity 6. Debility - as above 7. Hist CVA  Camille Bal, MD [facility] [phone] Pager 05/06/2016, 5:25 PM

## 2016-05-06 NOTE — Progress Notes (Signed)
                                                   Daily Progress Note   Patient Name: [name]       Date: 05/06/2016 DOB: 10/23/1975  Age: 41 y.o. MRN#: [id number] Attending Physician: Ripudeep K Rai, MD Primary Care Physician: Barr, Julie, NP Admit Date: 05/01/2016  Reason for Consultation/Follow-up: Establishing goals of care and Psychosocial/spiritual support  Subjective: [name] was in excellent spirits when I visited this morning. She had worked with PT and related that she had demonstrated her ability to stand, and her tolerance of sitting upright (she was in the bedside chair overnight). She remains focused on improving what she can and pursuing interventions that could prolong her life. Her family was a the bedside and equally supportive.   Length of Stay: 5  Current Medications: Scheduled Meds:  . amLODipine  10 mg Oral Daily  . atorvastatin  20 mg Oral Daily  . carvedilol  25 mg Oral BID WC  . doxycycline  100 mg Oral Q12H  . ferrous sulfate  325 mg Oral TID WC  . furosemide  160 mg Intravenous Q6H  . heparin  5,000 Units Subcutaneous Q8H  . hydrALAZINE  100 mg Oral TID  . insulin aspart  0-5 Units Subcutaneous QHS  . insulin aspart  0-9 Units Subcutaneous TID WC  . isosorbide mononitrate  30 mg Oral Daily  . metolazone  10 mg Oral BID  . multivitamin with minerals  1 tablet Oral Daily    Continuous Infusions:   PRN Meds: acetaminophen, albuterol, camphor-menthol, guaiFENesin-dextromethorphan, HYDROcodone-acetaminophen, nitroGLYCERIN, ondansetron **OR** ondansetron (ZOFRAN) IV  Physical Exam      Constitutional: She is oriented to person, place, and time. Vital signs are normal.  Morbidly obese woman lying in bed  HENT:  Head: Normocephalic and atraumatic.  Mouth/Throat: Oropharynx is clear and moist. No oropharyngeal exudate.  Eyes: EOM are normal.    Neck: Normal range of motion.  Cardiovascular: Normal rate and regular rhythm.   Pulmonary/Chest: No tachypnea. No respiratory distress. She has decreased breath sounds in the right lower field and the left lower field. She has no wheezes. She has rhonchi.  Abdominal: Soft. Bowel sounds are normal.  Musculoskeletal: She exhibits edema (edematous everywhere).  Limited ROM d/t body habitus  Neurological: She is alert and oriented to person, place, and time.  Skin: Skin is warm and dry.  Excoriation/open wound on bilateral thighs. Dressing with scant serosanguinous drainage Psychiatric: Judgment and thought content normal. Her speech is delayed. She is slowed.  Vital Signs: BP (!) 141/64 (BP Location: Left Arm)   Pulse 88   Temp 97.9 F (36.6 C) (Oral)   Resp 18   Wt (!) 161.9 kg (357 lb)   LMP 04/11/2016   SpO2 99%   BMI 57.62 kg/m  SpO2: SpO2: 99 % O2 Device: O2 Device: Not Delivered O2 Flow Rate: O2 Flow Rate (L/min): 3 L/min  Intake/output summary:  Intake/Output Summary (Last 24 hours) at 05/06/16 0914 Last data filed at 05/06/16 0700  Gross per 24 hour  Intake              290 ml  Output             2575 ml  Net            -  2285 ml   LBM: Last BM Date: 05/02/16 Baseline Weight: Weight: (!) 144.2 kg (318 lb) Most recent weight: Weight:  (Pt wanted to remain in chair.  Could not get weight.)  Palliative Assessment/Data: PPS 50%    Patient Active Problem List   Diagnosis Date Noted  . ESRD (end stage renal disease) (HCC)   . Goals of care, counseling/discussion   . Palliative care by specialist   . Acute metabolic encephalopathy 05/04/2016  . Acute renal failure superimposed on stage 5 chronic kidney disease, not on chronic dialysis (HCC) 05/04/2016  . Acute respiratory failure with hypoxia (HCC) 05/04/2016  . Right middle lobe pneumonia (HCC) 05/04/2016  . Leukocytosis 05/04/2016  . Uncontrolled diabetes mellitus with diabetic nephropathy, with long-term current  use of insulin (HCC) 05/04/2016  . Dyslipidemia associated with type 2 diabetes mellitus (HCC) 05/04/2016  . Acute pulmonary edema (HCC)   . History of CVA with residual deficit   . Anemia of chronic disease   . Lymphedema   . Debility   . Essential hypertension   . Morbid obesity (HCC) 02/05/2015    Palliative Care Assessment & Plan   HPI: 41 y.o. female  with past medical history of CKD stage V, DM2, CVA with residual right sided weakness, dCHF, morbid obesity, and HTN. She presented to the ED after progressive weakness over the prior week, to the point of being unable to get OOB. In the ED she was febrile with elevated WBC, CXR with fluid overload versus pneumonitis, and creatinine 7.96 (baseline 4). She was subsequently admitted on 05/01/2016 with respiratory illness and Acute kidney injury in the setting of CKD. Nephrology consulted and felt she was volume overloaded and have been working to diurese. The possibility of dialysis was raised given her progressive kidney disease, however unclear on pt's mobility (cannot do OP HD if bed-bound). Palliative consulted to assist in goals of care.   Assessment: I met with [name], her husband [name], and Dr. Shertz with Nephrology yesterday. In that meeting we discussed [name]'s progressive kidney issues, and the barriers to dialysis. Carlton and her husband were adamant that she had the capacity to get out of bed and transition to a chair, and were focused on pursuing any available life prolonging measure.   PT worked with [name] today and related her clear determination and motivation to participate in therapy. She was able to stand with assistance, as well as fully participate in the session. When I saw her she reiterated her goal of living as long as she could, and her willingness to work hard to achieve that. Her husband was supportive at the bedside and also stated his role in supporting her in her efforts for improvement.    Recommendations/Plan:  Full code  Nephrology will need to weigh-in again given the clarity in her mobility  Pt needs on-going rehab to support improvement, PT requested CIR evaluation  **Pt with clear goals of care and no significant symptom burden. Palliative will follow peripherally at this point. Please contact team at [phone] for questions, concerns, or need to re-engage.   Goals of Care and Additional Recommendations:  Limitations on Scope of Treatment: Full Scope Treatment  Code Status:  Full code  Prognosis:   Unable to determine  Discharge Planning:  To Be Determined  Care plan was discussed with pt and her husband.   Thank you for allowing the Palliative Medicine Team to assist in the care of this patient.  Total time: 25 minutes      Greater than 50%  of this time was spent counseling and coordinating care related to the above assessment and plan.   , NP Palliative Medicine Team [phone] pager (7a-5p) Team Phone # [phone]  

## 2016-05-06 NOTE — Progress Notes (Signed)
Rehab Admissions Coordinator Note:  Patient was screened by Trish [employer], Toma Arts M for appropriateness for an Inpatient Acute Rehab Consult.  At this time, we are recommending Inpatient Rehab consult.  Trish [employer], Jamarius Saha M 05/06/2016, 12:02 PM  I can be reached at [phone].

## 2016-05-06 NOTE — Progress Notes (Signed)
[facility]                                                                              Patient Demographics  [name], is a 41 y.o. female, DOB - 21-Jan-1976, EPP:[id number]  Admit date - 05/01/2016   Admitting Physician Costin Otelia Sergeant, MD  Outpatient Primary MD for the patient is Marletta Lor, NP  Outpatient specialists:   LOS - 5  days    Chief Complaint  Patient presents with  . wounds       Brief summary  41 y.o.femalewith medical history significant for morbid obesity, chronic kidney disease stage IV-V, diabetes mellitus, hypertension, prior CVA, diastolic CHF who presented to ED with progressive weakness, fever for past 1 week prior to this admission. She is actually bed bound for past year or so per her husband report ever since stroke. She also had dry cough and shortness of breath for past 1 week. She was hemodynamically stable on admission. Her creatinine was 7.9 (much worse since her recent Cr values of 4-5). Renal has seen her in consultation. Due to her debility she is not a good candidate for HD. Palliative care was consulted as well.      Assessment & Plan   Acute metabolic encephalopathy - Improving, at baseline mental status per family, sister and husband at the bedside  - Likely multifactorial from history of CVA, end stage renal disease, possible uremia, acute infectious process - possible pneumonia  Chest pain episode overnight - Per patient, she had right-sided chest pain, described as "gas pains" overnight, improved after one nitroglycerin - Troponin 1 was 0.04 however d-dimer was 2.01 - VQ scan still pending - No further chest pain episodes, patient states chest pain 0 at this time, will follow VQ scan  Acute renal failure superimposed on CKD stage 5 not on HD / Acute pulmonary edema  - Due to progressive disease from uncontrolled diabetes and FSG - Cr continues to trend up, 7.65 this am - Appreciate renal  following - Per renal, pt not a good candidate for HD because she is not able to get up from the bed. She has been bed bound for the past year or so after the stroke - Palliative medicine consulted for goals of care - Continue IV lasix and metolazone per renal recommendations   Acute respiratory failure with hypoxia / Right middle lobe pneumonia / Leukocytosis  - CT chest without contrast showed linear density over the right middle lobe likely atelectasis, although cannot completely exclude infection - She is on empiric doxycycline  - Blood cultures 1/2 showed colitis negative staph likely contaminant   Hypokalemia - Due to lasix - Supplemented  Anemia of chronic disease / IDA - Due to CKD - Status post 2 units PRBC transfusion on 05/02/2016  - Aranesp 3/22, continue iron supplementation, B12  Uncontrolled diabetes mellitus with diabetic nephropathy with long term insulin use - Continue current insulin regimen: SSI  Dyslipidemia associated with type 2 DM - Continue statin therapy   Essential hypertension - Continue Norvasc, coreg, hydralazine, Imdur   Bilateral inner thigh wounds - Related to moisture and  friction from thighs rubbing together - Left inner thigh has large area with many open areas scattered throughout, 17cm x 12cm x 0.1cm  - Right inner thigh wound is 8cm x 5cm x 0.3cm  - Dressing procedure/placement/frequency: Cleanse with NS, gently pat dry, apply Xeroform gauze, top with dry gauze,change every shift. Pt is already on a low air loss mattress  Morbid obesity / Debility  - Nutrition consulted - Body mass index is 54.58 kg/m.  Code Status: full  DVT Prophylaxis:  heparin  Family Communication: Discussed in detail with the patient, all imaging results, lab results explained to the patient , sister and husband   Disposition Plan: CIR consult placed  Time Spent in minutes 25 minutes  Procedures:    Consultants:    NephrologyDr. Delano Metz  WOC  Palliative care   Nutrition  Antimicrobials:  Doxycycline 3/21->   Medications  Scheduled Meds: . amLODipine  10 mg Oral Daily  . atorvastatin  20 mg Oral Daily  . carvedilol  25 mg Oral BID WC  . doxycycline  100 mg Oral Q12H  . ferrous sulfate  325 mg Oral TID WC  . furosemide  160 mg Intravenous Q6H  . heparin  5,000 Units Subcutaneous Q8H  . hydrALAZINE  100 mg Oral TID  . insulin aspart  0-5 Units Subcutaneous QHS  . insulin aspart  0-9 Units Subcutaneous TID WC  . isosorbide mononitrate  30 mg Oral Daily  . metolazone  10 mg Oral BID  . multivitamin with minerals  1 tablet Oral Daily   Continuous Infusions: PRN Meds:.acetaminophen, albuterol, camphor-menthol, guaiFENesin-dextromethorphan, HYDROcodone-acetaminophen, nitroGLYCERIN, ondansetron **OR** ondansetron (ZOFRAN) IV   Antibiotics   Anti-infectives    Start     Dose/Rate Route Frequency Ordered Stop   05/02/16 2200  doxycycline (VIBRA-TABS) tablet 100 mg     100 mg Oral Every 12 hours 05/02/16 1732     05/01/16 2200  piperacillin-tazobactam (ZOSYN) IVPB 2.25 g  Status:  Discontinued     2.25 g 100 mL/hr over 30 Minutes Intravenous Every 8 hours 05/01/16 1324 05/02/16 1729   05/01/16 1330  vancomycin (VANCOCIN) 2,500 mg in sodium chloride 0.9 % 500 mL IVPB     2,500 mg 250 mL/hr over 120 Minutes Intravenous  Once 05/01/16 1309 05/01/16 1551   05/01/16 1300  piperacillin-tazobactam (ZOSYN) IVPB 3.375 g     3.375 g 100 mL/hr over 30 Minutes Intravenous  Once 05/01/16 1258 05/01/16 1340   05/01/16 1300  vancomycin (VANCOCIN) IVPB 1000 mg/200 mL premix  Status:  Discontinued     1,000 mg 200 mL/hr over 60 Minutes Intravenous  Once 05/01/16 1258 05/01/16 1308        Subjective:   [name] was seen and examined today.  Overnight issues with right-sided atypical chest pain. Currently resolved. Patient denies dizziness, shortness of breath, abdominal pain, N/V/D/C, new weakness,  numbess, tingling.   Objective:   Vitals:   05/05/16 2318 05/06/16 0111 05/06/16 0655 05/06/16 0948  BP: (!) 133/59 (!) 143/68 (!) 141/64 115/82  Pulse: 88 87 88 94  Resp: 18 20 18 18   Temp: 98.1 F (36.7 C) 97.7 F (36.5 C) 97.9 F (36.6 C) 98.5 F (36.9 C)  TempSrc: Oral Oral Oral Oral  SpO2: 95% 98% 99% 93%  Weight:        Intake/Output Summary (Last 24 hours) at 05/06/16 1216 Last data filed at 05/06/16 1000  Gross per 24 hour  Intake  410 ml  Output             2475 ml  Net            -2065 ml     Wt Readings from Last 3 Encounters:  05/05/16 (!) 161.9 kg (357 lb)  03/29/16 (!) 167 kg (368 lb 3.2 oz)  03/09/16 (!) 168.3 kg (371 lb)     Exam  General: Alert andComfortable  HEENT:  Neck: Supple, no JVD, no masses  Cardiovascular: S1 S2 auscultated, no rubs, murmurs or gallops. Regular rate and rhythm.  Respiratory: Clear to auscultation bilaterally, no wheezing, rales or rhonchi  Gastrointestinal: Morbidly obese, Soft, nontender, nondistended, + bowel sounds  Ext: no cyanosis clubbing or edema, dressing in place on the thigh  Neuro: no new deficits  Skin: No rashes  Psych: Normal affect and demeanor, alert and oriented x3    Data Reviewed:  I have personally reviewed following labs and imaging studies  Micro Results Recent Results (from the past 240 hour(s))  Urine culture     Status: None   Collection Time: 05/01/16 11:44 AM  Result Value Ref Range Status   Specimen Description URINE, RANDOM  Final   Special Requests NONE  Final   Culture   Final    NO GROWTH Performed at [facility], [address] [place], [place] [address]    Report Status 05/02/2016 FINAL  Final  Blood Culture (routine x 2)     Status: Abnormal   Collection Time: 05/01/16 12:20 PM  Result Value Ref Range Status   Specimen Description BLOOD LEFT ANTECUBITAL  Final   Special Requests BOTTLES DRAWN AEROBIC AND ANAEROBIC 5CC  Final   Culture  Setup  Time   Final    GRAM POSITIVE COCCI IN CLUSTERS AEROBIC BOTTLE ONLY CRITICAL RESULT CALLED TO, READ BACK BY AND VERIFIED WITH: T GREEN PHARMD [id number] 05/02/16 A BROWNING    Culture (A)  Final    STAPHYLOCOCCUS SPECIES (COAGULASE NEGATIVE) THE SIGNIFICANCE OF ISOLATING THIS ORGANISM FROM A SINGLE SET OF BLOOD CULTURES WHEN MULTIPLE SETS ARE DRAWN IS UNCERTAIN. PLEASE NOTIFY THE MICROBIOLOGY DEPARTMENT WITHIN ONE WEEK IF SPECIATION AND SENSITIVITIES ARE REQUIRED. Performed at [facility], [address] [place], [place] [address]    Report Status 05/04/2016 FINAL  Final  Blood Culture ID Panel (Reflexed)     Status: None   Collection Time: 05/01/16 12:20 PM  Result Value Ref Range Status   Enterococcus species NOT DETECTED NOT DETECTED Final   Listeria monocytogenes NOT DETECTED NOT DETECTED Final   Staphylococcus species NOT DETECTED NOT DETECTED Final   Staphylococcus aureus NOT DETECTED NOT DETECTED Final   Streptococcus species NOT DETECTED NOT DETECTED Final   Streptococcus agalactiae NOT DETECTED NOT DETECTED Final   Streptococcus pneumoniae NOT DETECTED NOT DETECTED Final   Streptococcus pyogenes NOT DETECTED NOT DETECTED Final   Acinetobacter baumannii NOT DETECTED NOT DETECTED Final   Enterobacteriaceae species NOT DETECTED NOT DETECTED Final   Enterobacter cloacae complex NOT DETECTED NOT DETECTED Final   Escherichia coli NOT DETECTED NOT DETECTED Final   Klebsiella oxytoca NOT DETECTED NOT DETECTED Final   Klebsiella pneumoniae NOT DETECTED NOT DETECTED Final   Proteus species NOT DETECTED NOT DETECTED Final   Serratia marcescens NOT DETECTED NOT DETECTED Final   Haemophilus influenzae NOT DETECTED NOT DETECTED Final   Neisseria meningitidis NOT DETECTED NOT DETECTED Final   Pseudomonas aeruginosa NOT DETECTED NOT DETECTED Final   Candida  albicans NOT DETECTED NOT DETECTED Final   Candida glabrata NOT DETECTED NOT DETECTED Final   Candida krusei NOT DETECTED NOT  DETECTED Final   Candida parapsilosis NOT DETECTED NOT DETECTED Final   Candida tropicalis NOT DETECTED NOT DETECTED Final    Comment: Performed at [facility], [address] [place], [place] [address]  Blood Culture (routine x 2)     Status: None (Preliminary result)   Collection Time: 05/01/16 12:26 PM  Result Value Ref Range Status   Specimen Description BLOOD LEFT HAND  Final   Special Requests BOTTLES DRAWN AEROBIC AND ANAEROBIC 5CC  Final   Culture   Final    NO GROWTH 4 DAYS Performed at [facility], [address] [place], [place] [address]    Report Status PENDING  Incomplete    Radiology Reports Dg Chest 2 View  Result Date: 05/01/2016 CLINICAL DATA:  Fever and weakness. EXAM: CHEST  2 VIEW COMPARISON:  03/22/2016. FINDINGS: Cardiomegaly with mild bilateral interstitial prominence. No pleural effusion or pneumothorax. No acute bony abnormality. IMPRESSION: Cardiomegaly with mild bilateral interstitial prominence consistent with CHF. Findings have improved from prior study of 03/22/2016. Given patient's history of fever pneumonitis cannot be excluded. Electronically Signed   By: Maisie Fus  Register   On: 05/01/2016 13:30   Ct Chest Wo Contrast  Result Date: 05/02/2016 CLINICAL DATA:  Morbid obesity week chronic kidney disease and diabetes. Complains of weakness and fever over the past week. Cough. EXAM: CT CHEST WITHOUT CONTRAST TECHNIQUE: Multidetector CT imaging of the chest was performed following the standard protocol without IV contrast. COMPARISON:  Chest x-ray 05/01/2016 FINDINGS: Cardiovascular: Mild to moderate cardiomegaly. Remaining vascular structures are unremarkable. Mediastinum/Nodes: No definite mediastinal or hilar adenopathy. Remaining mediastinal structures are unremarkable. Lungs/Pleura: Lungs are somewhat hypoinflated and demonstrate mild bibasilar linear atelectasis. Linear density over the posterior and anterior right middle lobe likely  atelectasis. No evidence of effusion. Airways are unremarkable. Upper Abdomen: Mild calcified plaque over the celiac axis, splenic artery and superior mesenteric arteries. Musculoskeletal: Minimal degenerative change of the spine. Prominent overlying soft tissues. IMPRESSION: Linear density over the right middle lobe likely atelectasis, although cannot completely exclude infection. Minimal linear bibasilar atelectasis. Moderate cardiomegaly. Electronically Signed   By: Elberta Fortis M.D.   On: 05/02/2016 15:53   Dg Chest Port 1 View  Result Date: 05/06/2016 CLINICAL DATA:  Acute onset of generalized chest pain. Initial encounter. EXAM: PORTABLE CHEST 1 VIEW COMPARISON:  Chest radiograph performed 05/01/2016, and CT of the chest performed 05/02/2016 FINDINGS: The lungs are hypoexpanded. Vascular congestion and vascular crowding are noted. Increased interstitial markings may reflect mild interstitial edema. There is no evidence of pleural effusion or pneumothorax. The cardiomediastinal silhouette is mildly enlarged. No acute osseous abnormalities are seen. IMPRESSION: Lungs hypoexpanded. Vascular congestion and mild cardiomegaly. Increased interstitial markings could reflect mild interstitial edema. Electronically Signed   By: Roanna Raider M.D.   On: 05/06/2016 02:48    Lab Data:  CBC:  Recent Labs Lab 05/01/16 1218 05/02/16 0555 05/02/16 0830 05/02/16 1830 05/03/16 0533 05/05/16 0543  WBC 14.0* 13.1* 13.4*  --  12.2* 11.9*  NEUTROABS 11.0*  --   --   --   --   --   HGB 8.3* 6.6* 6.8* 7.8* 8.2* 8.6*  HCT 25.2* 19.2* 20.8* 23.6* 24.6* 26.7*  MCV 83.7 83.8 82.9  --  84.2 85.9  PLT 428* 498* 475*  --  455* 511*   Basic Metabolic Panel:  Recent Labs Lab 05/01/16 1218 05/02/16 0555 05/03/16 0533 05/03/16 1520 05/05/16 0543 05/06/16 0213  NA 140 140  --  138 142  142 142  K 3.5 3.9  --  3.2* 3.3*  3.3* 3.4*  CL 98* 99*  --  100* 95*  95* 97*  CO2 23 23  --  24 26  25 25   GLUCOSE  102* 120*  --  140* 163*  170* 173*  BUN 98* 108*  --  117* 131*  132* 130*  CREATININE 7.96* 7.90* 7.84* 7.65* 7.18*  7.22* 7.01*  CALCIUM 8.7* 8.4*  --  8.2* 8.9  8.8* 8.7*  PHOS  --   --   --  8.8* 7.6* 7.2*   GFR: Estimated Creatinine Clearance: 17.1 mL/min (A) (by C-G formula based on SCr of 7.01 mg/dL (H)). Liver Function Tests:  Recent Labs Lab 05/01/16 1218 05/02/16 0555 05/03/16 1520 05/05/16 0543 05/06/16 0213  AST 9* 10*  --   --   --   ALT 8* 7*  --   --   --   ALKPHOS 65 60  --   --   --   BILITOT 0.9 1.0  --   --   --   PROT 7.9 7.1  --   --   --   ALBUMIN 2.4* 2.2* 2.1* 2.0* 2.1*   No results for input(s): LIPASE, AMYLASE in the last 168 hours. No results for input(s): AMMONIA in the last 168 hours. Coagulation Profile: No results for input(s): INR, PROTIME in the last 168 hours. Cardiac Enzymes:  Recent Labs Lab 05/06/16 0213  TROPONINI 0.04*   BNP (last 3 results) No results for input(s): PROBNP in the last 8760 hours. HbA1C: No results for input(s): HGBA1C in the last 72 hours. CBG:  Recent Labs Lab 05/05/16 1150 05/05/16 1622 05/05/16 2309 05/06/16 0809 05/06/16 1149  GLUCAP 184* 170* 189* 161* 174*   Lipid Profile: No results for input(s): CHOL, HDL, LDLCALC, TRIG, CHOLHDL, LDLDIRECT in the last 72 hours. Thyroid Function Tests: No results for input(s): TSH, T4TOTAL, FREET4, T3FREE, THYROIDAB in the last 72 hours. Anemia Panel: No results for input(s): VITAMINB12, FOLATE, FERRITIN, TIBC, IRON, RETICCTPCT in the last 72 hours. Urine analysis:    Component Value Date/Time   COLORURINE STRAW (A) 05/01/2016 1144   APPEARANCEUR CLEAR 05/01/2016 1144   LABSPEC 1.009 05/01/2016 1144   PHURINE 5.0 05/01/2016 1144   GLUCOSEU NEGATIVE 05/01/2016 1144   HGBUR SMALL (A) 05/01/2016 1144   BILIRUBINUR NEGATIVE 05/01/2016 1144   KETONESUR NEGATIVE 05/01/2016 1144   PROTEINUR 30 (A) 05/01/2016 1144   UROBILINOGEN 0.2 10/26/2013 1721    NITRITE NEGATIVE 05/01/2016 1144   LEUKOCYTESUR NEGATIVE 05/01/2016 1144     Evren Shankland M.D. [facility] 05/06/2016, 12:16 PM  Pager: [phone] Between 7am to 7pm - call Pager - [phone]  After 7pm go to [email or web address] - password [email or web address]  Call night coverage person covering after 7pm

## 2016-05-06 NOTE — Consult Note (Signed)
Physical Medicine and Rehabilitation Consult Reason for Consult: Debilitation/acute hypoxic respiratory failure with encephalopathy Referring Physician: [facility]ad   HPI: [name] is a 41 y.o. right handed female with history of morbid obesity, chronic kidney disease stage V, diabetes mellitus, hypertension, prior CVA, diastolic congestive heart failure. Presented 05/01/2016 with complaints of weakness, cough, lower extremity edema and low-grade fever that had progressed over the past 1 week. Per chart review patient lives with spouse. She has a home health aide that takes care of some lower extremity leg wounds. She has 3 children 733, 344 and 485 years old. She primarily is wheelchair/bed bound since February and can transfer from wheelchair to bedside commode. Chest x-ray/CT of the chest consistent with CHF. Creatinine elevated to 7.96 from recent baseline 5.17. WBC 14,000. Troponin negative. Placed on intravenous diuresis. Concern for pneumonia maintained on broad-spectrum antibiotics. Nephrology consulted for increasing creatinine. Currently no plan for hemodialysis. Palliative care consulted 05/05/2016 to establish goals of care. Subcutaneous heparin for DVT prophylaxis. Physical therapy evaluation completed with recommendations of physical medicine rehabilitation consult.   Review of Systems  Constitutional: Positive for fever. Negative for chills.  HENT: Negative for hearing loss.   Eyes: Negative for blurred vision and double vision.  Respiratory: Positive for cough and shortness of breath.   Cardiovascular: Positive for palpitations and leg swelling. Negative for chest pain.  Gastrointestinal: Positive for constipation. Negative for nausea.       GERD  Genitourinary: Negative for flank pain and hematuria.  Musculoskeletal: Positive for back pain, joint pain and myalgias.  Skin: Negative for rash.  Neurological: Positive for weakness. Negative for seizures.  All other  systems reviewed and are negative.  Past Medical History:  Diagnosis Date  . Anasarca 02/05/2015  . Asthma   . CHF (congestive heart failure) (HCC)    diastolic  . CVA (cerebral infarction) 1990's   "writing is not the same since" R leg weakness   . GERD (gastroesophageal reflux disease)   . Hypertension   . Morbid obesity (HCC)   . Palliative care encounter   . Stroke (HCC)   . Type II diabetes mellitus (HCC)    Past Surgical History:  Procedure Laterality Date  . INCISION AND DRAINAGE OF WOUND Left 08/30/2013   thigh necrotic wound/notes 08/30/2013  . IRRIGATION AND DEBRIDEMENT ABSCESS Left 08/30/2013   Procedure: IRRIGATION AND DEBRIDEMENT ABSCESS Left Thigh Necrotic Wound;  Surgeon: Axel FillerArmando Ramirez, MD;  Location: [facility] OR;  Service: General;  Laterality: Left;  . TEE WITHOUT CARDIOVERSION N/A 09/07/2013   Procedure: TRANSESOPHAGEAL ECHOCARDIOGRAM (TEE);  Surgeon: Chrystie NoseKenneth C. Hilty, MD;  [facility];  Service: Cardiovascular;  Laterality: N/A;  . TEE WITHOUT CARDIOVERSION N/A 02/08/2015   Procedure: TRANSESOPHAGEAL ECHOCARDIOGRAM (TEE);  Surgeon: Chilton Siiffany [place], MD;  [facility];  Service: Cardiovascular;  Laterality: N/A;   Family History  Problem Relation Age of Onset  . Diabetes Mother   . Kidney disease Mother    Social History:  reports that she has never smoked. She has never used smokeless tobacco. She reports that she does not drink alcohol or use drugs. Allergies:  Allergies  Allergen Reactions  . Azithromycin Other (See Comments)    Nose bleeding event   Medications Prior to Admission  Medication Sig Dispense Refill  . albuterol (PROAIR HFA) 108 (90 Base) MCG/ACT inhaler Inhale 2 puffs into the lungs every 6 (six) hours as needed for wheezing or shortness of breath. 1 Inhaler 0  . albuterol (  PROVENTIL) (2.5 MG/3ML) 0.083% nebulizer solution Take 3 mLs (2.5 mg total) by nebulization every 6 (six) hours as needed for wheezing or shortness of  breath. 75 mL 0  . amLODipine (NORVASC) 10 MG tablet Take 1 tablet (10 mg total) by mouth daily. 30 tablet 0  . atorvastatin (LIPITOR) 20 MG tablet Take 20 mg by mouth daily.    . carvedilol (COREG) 25 MG tablet Take 1 tablet (25 mg total) by mouth 2 (two) times daily with a meal. 60 tablet 1  . ferrous sulfate 325 (65 FE) MG tablet Take 1 tablet (325 mg total) by mouth 3 (three) times daily with meals. 90 tablet 3  . furosemide (LASIX) 80 MG tablet Take 2 tablets (160 mg total) by mouth every 6 (six) hours. 120 tablet 0  . hydrALAZINE (APRESOLINE) 50 MG tablet Take 2 tablets (100 mg total) by mouth 3 (three) times daily. 90 tablet 0  . insulin aspart (NOVOLOG) 100 UNIT/ML injection Inject 0-20 Units into the skin 3 (three) times daily with meals. CBG < 70: implement hypoglycemia protocol CBG 70 - 120: 0 units CBG 121 - 150: 3 units CBG 151 - 200: 4 units CBG 201 - 250: 7 units CBG 251 - 300: 11 units CBG 301 - 350: 15 units CBG 351 - 400: 20 units 10 mL 0  . isosorbide mononitrate (IMDUR) 30 MG 24 hr tablet Take 1 tablet (30 mg total) by mouth daily. 30 tablet 3  . metolazone (ZAROXOLYN) 10 MG tablet Take 1 tablet (10 mg total) by mouth daily. 30 tablet 0  . polyethylene glycol powder (GLYCOLAX/MIRALAX) powder Dissolve 1 capful into 4 to 8 oz of fluid  4  . potassium chloride SA (K-DUR,KLOR-CON) 20 MEQ tablet Take 2 tablets (40 mEq total) by mouth daily. 30 tablet 0  . vitamin A 8000 UNIT capsule Take 8,000 Units by mouth daily.    . camphor-menthol (SARNA) lotion Apply 1 application topically every 8 (eight) hours as needed for itching. 222 mL 0    Home: Home Living Family/patient expects to be discharged to:: Private residence Living Arrangements: Spouse/significant other Available Help at Discharge: Family, Available 24 hours/day Type of Home: Apartment Home Access: Level entry Home Layout: One level Home Equipment: Bedside commode, Environmental consultant - 2 wheels, Wheelchair - manual,  Wheelchair - power, [employer] - quad, Information systems manager Additional Comments: pt has 3, 4, and 7 yo children  Functional History: Prior Function Level of Independence: Needs assistance Gait / Transfers Assistance Needed: has not walked at home since CVA. Walked some in rehab but has been afraid of falling since home and so transfers to w/c or [facility] but otherwise stays in bed (from previous admission in Feb) Comments: pt remains poor historian at this time, confirms using w/c for mobility at home Functional Status:  Mobility: Bed Mobility Overal bed mobility: Needs Assistance Bed Mobility: Rolling Rolling: Mod assist Supine to sit: Max assist, HOB elevated, +2 for physical assistance Sit to supine: +2 for physical assistance, Total assist General bed mobility comments: Rolled in bed to help with managing Maxi-sky pad; Needing less assist to roll L; good use of rail to pull onto side and stabilize Transfers Overall transfer level: Needs assistance Equipment used: 2 person hand held assist Transfer via Lift Equipment: Maxisky Transfers: Sit to/from Stand (and bed to chair) Sit to Stand: +2 physical assistance, Max assist General transfer comment: Utilized Bari-transfer technique with bilateral heavy knee blocks (padded with towels); Pt stood 3 times, approx 10-20  seconds each time; After transfer training, used Maxisky to assist Ms. Mckinnon back to bed      ADL:    Cognition: Cognition Overall Cognitive Status: Within Functional Limits for tasks assessed Orientation Level: Oriented to person Cognition Arousal/Alertness: Awake/alert Behavior During Therapy: WFL for tasks assessed/performed Overall Cognitive Status: Within Functional Limits for tasks assessed General Comments: pt poor historian, little verbalizations, agreeable to tasks  Blood pressure 115/82, pulse 94, temperature 98.5 F (36.9 C), temperature source Oral, resp. rate 18, weight (!) 161.9 kg (357 lb), last menstrual period  04/11/2016, SpO2 93 %. Physical Exam  Constitutional:  41 year old morbidly obese right-handed female  Eyes:  Pupils round and reactive to light  Neck: Normal range of motion. Neck supple.  Cardiovascular: Normal rate and regular rhythm.   Respiratory:  Decreased breath sounds at the bases  GI: Soft. Bowel sounds are normal. There is no tenderness.  Obese  Neurological: She is alert.  Mood is flat but appropriate. Follows simple commands. She would answer basic questions as name, age and date of birth.  Skin:  Extensive ischemic changes lower extremities.    Results for orders placed or performed during the hospital encounter of 05/01/16 (from the past 24 hour(s))  Glucose, capillary     Status: Abnormal   Collection Time: 05/05/16  4:22 PM  Result Value Ref Range   Glucose-Capillary 170 (H) 65 - 99 mg/dL  Glucose, capillary     Status: Abnormal   Collection Time: 05/05/16 11:09 PM  Result Value Ref Range   Glucose-Capillary 189 (H) 65 - 99 mg/dL  Renal function panel     Status: Abnormal   Collection Time: 05/06/16  2:13 AM  Result Value Ref Range   Sodium 142 135 - 145 mmol/L   Potassium 3.4 (L) 3.5 - 5.1 mmol/L   Chloride 97 (L) 101 - 111 mmol/L   CO2 25 22 - 32 mmol/L   Glucose, Bld 173 (H) 65 - 99 mg/dL   BUN 147 (H) 6 - 20 mg/dL   Creatinine, Ser 8.29 (H) 0.44 - 1.00 mg/dL   Calcium 8.7 (L) 8.9 - 10.3 mg/dL   Phosphorus 7.2 (H) 2.5 - 4.6 mg/dL   Albumin 2.1 (L) 3.5 - 5.0 g/dL   GFR calc non Af Amer 7 (L) >60 mL/min   GFR calc Af Amer 8 (L) >60 mL/min   Anion gap 20 (H) 5 - 15  D-dimer, quantitative (not at [facility])     Status: Abnormal   Collection Time: 05/06/16  2:13 AM  Result Value Ref Range   D-Dimer, Quant 2.01 (H) 0.00 - 0.50 ug/mL-FEU  Troponin I     Status: Abnormal   Collection Time: 05/06/16  2:13 AM  Result Value Ref Range   Troponin I 0.04 (HH) <0.03 ng/mL  hCG, serum, qualitative     Status: None   Collection Time: 05/06/16  7:26 AM  Result Value  Ref Range   Preg, Serum NEGATIVE NEGATIVE  Glucose, capillary     Status: Abnormal   Collection Time: 05/06/16  8:09 AM  Result Value Ref Range   Glucose-Capillary 161 (H) 65 - 99 mg/dL  Glucose, capillary     Status: Abnormal   Collection Time: 05/06/16 11:49 AM  Result Value Ref Range   Glucose-Capillary 174 (H) 65 - 99 mg/dL   Dg Chest Port 1 View  Result Date: 05/06/2016 CLINICAL DATA:  Acute onset of generalized chest pain. Initial encounter. EXAM: PORTABLE CHEST 1 VIEW COMPARISON:  Chest radiograph performed 05/01/2016, and CT of the chest performed 05/02/2016 FINDINGS: The lungs are hypoexpanded. Vascular congestion and vascular crowding are noted. Increased interstitial markings may reflect mild interstitial edema. There is no evidence of pleural effusion or pneumothorax. The cardiomediastinal silhouette is mildly enlarged. No acute osseous abnormalities are seen. IMPRESSION: Lungs hypoexpanded. Vascular congestion and mild cardiomegaly. Increased interstitial markings could reflect mild interstitial edema. Electronically Signed   By: Roanna Raider M.D.   On: 05/06/2016 02:48    Assessment/Plan: Diagnosis: debility due to multiple medical 1. Does the need for close, 24 hr/day medical supervision in concert with the patient's rehab needs make it unreasonable for this patient to be served in a less intensive setting? Potentially 2. Co-Morbidities requiring supervision/potential complications: morbid obesity, chronic leg wounds, anemia 3. Due to bladder management, bowel management, safety, skin/wound care, disease management, medication administration, pain management and patient education, does the patient require 24 hr/day rehab nursing? Yes 4. Does the patient require coordinated care of a physician, rehab nurse, PT (1-2 hrs/day, 5 days/week) and OT (1-2 hrs/day, 5 days/week) to address physical and functional deficits in the context of the above medical diagnosis(es)? Yes Addressing  deficits in the following areas: balance, endurance, locomotion, strength, transferring, bowel/bladder control, bathing, dressing, feeding, grooming, toileting and psychosocial support 5. Can the patient actively participate in an intensive therapy program of at least 3 hrs of therapy per day at least 5 days per week? Potentially 6. The potential for patient to make measurable gains while on inpatient rehab is fair 7. Anticipated functional outcomes upon discharge from inpatient rehab are supervision and min assist  with PT, supervision and min assist with OT, n/a with SLP. 8. Estimated rehab length of stay to reach the above functional goals is: TBD 9. Does the patient have adequate social supports and living environment to accommodate these discharge functional goals? No and Potentially 10. Anticipated D/C setting: Home 11. Anticipated post D/C treatments: HH therapy 12. Overall Rehab/Functional Prognosis: fair  RECOMMENDATIONS: This patient's condition is appropriate for continued rehabilitative care in the following setting: see below Patient has agreed to participate in recommended program. Potentially Note that insurance prior authorization may be required for reimbursement for recommended care.  Comment: Pt was sedentary prior to admit. I question whether we would be able to achieve mod I goals in the setting of inpatient rehab. Honestly, I sincerely doubt this was a safe situation prior to her admission. Would like to follow along for therapy progress and review psychosocial and dispo situation further. However, realistically speaking, she's likely most appropriate for SNF.   Ranelle Oyster, MD, [facility] Physical Medicine & Rehabilitation 05/06/2016    Charlton Amor., PA-C 05/06/2016

## 2016-05-06 NOTE — Progress Notes (Signed)
qPhysical Therapy Treatment Patient Details Name: [name] MRN: [id number] DOB: December 24, 1975 Today's Date: 05/06/2016    History of Present Illness 41 y.o. female with medical history significant of morbid obesity, chronic kidney disease stage IV-V, diabetes mellitus, hypertension, prior CVA, diastolic CHF, presents to the emergency room with chief complaint of weakness as well as fever and diagnosed with acute hypoxic resp failure with acute encephalopathy    PT Comments    Continuing work on functional mobility and activity tolerance; Able to stand today with Max assist of 2 and participated fully in therex; Ms. Sandi Mariscal has clearly stated to therapy her goal of getting better and being able to qualify for HD; Motivated to work; we discussed the amount of work it will take to improve her function, and she agrees to it;   Ms. Sandi Mariscal has a supportive family who can offer near 24 hour assist at home; She is young; She tolerated far more than 8 hours in the recliner;  Recommend the below equipment to continue to facilitate progress with mobility and activity tolerance.  Will request OT consult to further address ADLs;   Left a voicemail re: today's PT session with Palliative Care team;   She will benefit from post-acute rehab to maximize independence and safety with mobility, and for intense training in prep for possible Outpt HD in the future; Will place IP Rehab Screen  Follow Up Recommendations  CIR     Equipment Recommendations  Hospital bed;Other (comment);Wheelchair (measurements PT);Wheelchair cushion (measurements PT) (mechanical lift; Bari WC, Bari RW)    Recommendations for Other Services Rehab consult;OT consult     Precautions / Restrictions Precautions Precautions: Fall Precaution Comments: bil inner thigh wounds    Mobility  Bed Mobility Overal bed mobility: Needs Assistance Bed Mobility: Rolling Rolling: Mod assist         General bed mobility  comments: Rolled in bed to help with managing Maxi-sky pad; Needing less assist to roll L; good use of rail to pull onto side and stabilize  Transfers Overall transfer level: Needs assistance Equipment used: 2 person hand held assist Transfers: Sit to/from Stand (and bed to chair) Sit to Stand: +2 physical assistance;Max assist         General transfer comment: Utilized Bari-transfer technique with bilateral heavy knee blocks (padded with towels); Pt stood 3 times, approx 10-20 seconds each time; After transfer training, used Maxisky to assist Ms. Mckinnon back to bed  Ambulation/Gait                 Stairs            Wheelchair Mobility    Modified Rankin (Stroke Patients Only)       Balance Overall balance assessment: Needs assistance   Sitting balance-Leahy Scale: Fair Sitting balance - Comments: Able to pull from supported sitting in recliner to unsupproted sitting, and maintian unsupprted sitting at edge of chair without physical assist     Standing balance-Leahy Scale: Zero Standing balance comment: Max assist to maintain standing; 3 reps                            Cognition Arousal/Alertness: Awake/alert Behavior During Therapy: WFL for tasks assessed/performed Overall Cognitive Status: Within Functional Limits for tasks assessed  Exercises Other Exercises Other Exercises: Bil UE therex in PNF patterns to get trunk recruitment with reaching and crossing midline; at least 20 reps Other Exercises: Cross body hand to knee reaching to get trunk involvement; approx 10 reps    General Comments        Pertinent Vitals/Pain Pain Assessment: Faces Faces Pain Scale: Hurts little more Pain Location: Bil thighs with use of Maxi-sky Pain Descriptors / Indicators: Sore;Grimacing Pain Intervention(s): Monitored during session;Repositioned    Home Living                      Prior  Function            PT Goals (current goals can now be found in the care plan section) Acute Rehab PT Goals Patient Stated Goal: Wants to be able to move well enough to qualify for HD PT Goal Formulation: With patient Time For Goal Achievement: 05/10/16 Potential to Achieve Goals: Fair Progress towards PT goals: Progressing toward goals    Frequency    Min 3X/week      PT Plan Discharge plan needs to be updated    Co-evaluation             End of Session Equipment Utilized During Treatment: Gait belt Activity Tolerance: Patient tolerated treatment well Patient left: in bed;with call bell/phone within reach;with family/visitor present Nurse Communication: Mobility status;Need for lift equipment PT Visit Diagnosis: Muscle weakness (generalized) (M62.81);Other abnormalities of gait and mobility (R26.89)     Time: [id number] PT Time Calculation (min) (ACUTE ONLY): 34 min  Charges:  $Therapeutic Exercise: 8-22 mins $Therapeutic Activity: 8-22 mins                    G Codes:       Van ClinesHolly Lenette Rau, PT  Acute Rehabilitation Services Pager [phone] Office [phone]    Levi AlandHolly H Caedin Mogan 05/06/2016, 10:16 AM

## 2016-05-07 ENCOUNTER — Inpatient Hospital Stay (HOSPITAL_COMMUNITY): Payer: Medicaid Other

## 2016-05-07 ENCOUNTER — Encounter (HOSPITAL_COMMUNITY): Payer: Self-pay | Admitting: Diagnostic Radiology

## 2016-05-07 DIAGNOSIS — I10 Essential (primary) hypertension: Secondary | ICD-10-CM

## 2016-05-07 DIAGNOSIS — N184 Chronic kidney disease, stage 4 (severe): Secondary | ICD-10-CM

## 2016-05-07 DIAGNOSIS — N186 End stage renal disease: Secondary | ICD-10-CM

## 2016-05-07 HISTORY — PX: IR GENERIC HISTORICAL: IMG1180011

## 2016-05-07 LAB — CBC
HCT: 28.8 % — ABNORMAL LOW (ref 36.0–46.0)
HEMOGLOBIN: 9.5 g/dL — AB (ref 12.0–15.0)
MCH: 28.9 pg (ref 26.0–34.0)
MCHC: 33 g/dL (ref 30.0–36.0)
MCV: 87.5 fL (ref 78.0–100.0)
Platelets: 456 10*3/uL — ABNORMAL HIGH (ref 150–400)
RBC: 3.29 MIL/uL — ABNORMAL LOW (ref 3.87–5.11)
RDW: 14.5 % (ref 11.5–15.5)
WBC: 10.7 10*3/uL — ABNORMAL HIGH (ref 4.0–10.5)

## 2016-05-07 LAB — RENAL FUNCTION PANEL
ALBUMIN: 2.1 g/dL — AB (ref 3.5–5.0)
ANION GAP: 18 — AB (ref 5–15)
BUN: 129 mg/dL — ABNORMAL HIGH (ref 6–20)
CALCIUM: 8.8 mg/dL — AB (ref 8.9–10.3)
CO2: 27 mmol/L (ref 22–32)
CREATININE: 6.66 mg/dL — AB (ref 0.44–1.00)
Chloride: 98 mmol/L — ABNORMAL LOW (ref 101–111)
GFR calc Af Amer: 8 mL/min — ABNORMAL LOW (ref >60)
GFR calc non Af Amer: 7 mL/min — ABNORMAL LOW (ref >60)
GLUCOSE: 169 mg/dL — AB (ref 65–99)
PHOSPHORUS: 6.6 mg/dL — AB (ref 2.5–4.6)
Potassium: 3.7 mmol/L (ref 3.5–5.1)
SODIUM: 143 mmol/L (ref 135–145)

## 2016-05-07 LAB — IRON AND TIBC: IRON: 24 ug/dL — AB (ref 28–170)

## 2016-05-07 LAB — APTT: APTT: 44 s — AB (ref 24–36)

## 2016-05-07 LAB — GLUCOSE, CAPILLARY
GLUCOSE-CAPILLARY: 166 mg/dL — AB (ref 65–99)
GLUCOSE-CAPILLARY: 178 mg/dL — AB (ref 65–99)
Glucose-Capillary: 147 mg/dL — ABNORMAL HIGH (ref 65–99)
Glucose-Capillary: 179 mg/dL — ABNORMAL HIGH (ref 65–99)
Glucose-Capillary: 180 mg/dL — ABNORMAL HIGH (ref 65–99)

## 2016-05-07 LAB — PROTIME-INR
INR: 1.7
Prothrombin Time: 20.2 seconds — ABNORMAL HIGH (ref 11.4–15.2)

## 2016-05-07 LAB — FERRITIN: FERRITIN: 1069 ng/mL — AB (ref 11–307)

## 2016-05-07 MED ORDER — GELATIN ABSORBABLE 12-7 MM EX MISC
CUTANEOUS | Status: AC
  Filled 2016-05-07: qty 1

## 2016-05-07 MED ORDER — LIDOCAINE HCL (PF) 1 % IJ SOLN: 5.0000 mL | INTRAMUSCULAR | Status: DC | PRN

## 2016-05-07 MED ORDER — SODIUM CHLORIDE 0.9 % IV SOLN: 100.0000 mL | INTRAVENOUS | Status: DC | PRN

## 2016-05-07 MED ORDER — HEPARIN SODIUM (PORCINE) 1000 UNIT/ML DIALYSIS: 1000.0000 [IU] | INTRAMUSCULAR | Status: DC | PRN

## 2016-05-07 MED ORDER — FENTANYL CITRATE (PF) 100 MCG/2ML IJ SOLN
INTRAMUSCULAR | Status: AC | PRN
  Administered 2016-05-07 (×2): 50 ug via INTRAVENOUS

## 2016-05-07 MED ORDER — LIDOCAINE-PRILOCAINE 2.5-2.5 % EX CREA: 1.0000 "application " | TOPICAL_CREAM | CUTANEOUS | Status: DC | PRN

## 2016-05-07 MED ORDER — HEPARIN SODIUM (PORCINE) 1000 UNIT/ML IJ SOLN
INTRAMUSCULAR | Status: AC
  Administered 2016-05-07: 2.8 [IU]
  Filled 2016-05-07: qty 1

## 2016-05-07 MED ORDER — ALTEPLASE 2 MG IJ SOLR: 2.0000 mg | Freq: Once | INTRAMUSCULAR | Status: DC | PRN

## 2016-05-07 MED ORDER — VANCOMYCIN HCL IN DEXTROSE 1-5 GM/200ML-% IV SOLN
1000.0000 mg | INTRAVENOUS | Status: AC
  Administered 2016-05-08: 1000 mg via INTRAVENOUS

## 2016-05-07 MED ORDER — PENTAFLUOROPROP-TETRAFLUOROETH EX AERO: 1.0000 "application " | INHALATION_SPRAY | CUTANEOUS | Status: DC | PRN

## 2016-05-07 MED ORDER — LIDOCAINE HCL (PF) 1 % IJ SOLN
INTRAMUSCULAR | Status: AC
Start: 2016-05-07 — End: 2016-05-08
  Filled 2016-05-07: qty 30

## 2016-05-07 MED ORDER — MIDAZOLAM HCL 2 MG/2ML IJ SOLN
INTRAMUSCULAR | Status: AC
  Filled 2016-05-07: qty 4

## 2016-05-07 MED ORDER — MIDAZOLAM HCL 2 MG/2ML IJ SOLN
INTRAMUSCULAR | Status: AC | PRN
  Administered 2016-05-07 (×2): 1 mg via INTRAVENOUS

## 2016-05-07 MED ORDER — FENTANYL CITRATE (PF) 100 MCG/2ML IJ SOLN
INTRAMUSCULAR | Status: AC
  Filled 2016-05-07: qty 4

## 2016-05-07 MED ORDER — HEPARIN SODIUM (PORCINE) 1000 UNIT/ML DIALYSIS: 4000.0000 [IU] | INTRAMUSCULAR | Status: DC | PRN

## 2016-05-07 MED ORDER — DEXTROSE 5 % IV SOLN
3.0000 g | INTRAVENOUS | Status: AC
  Administered 2016-05-07: 3 g via INTRAVENOUS
  Filled 2016-05-07 (×2): qty 3000

## 2016-05-07 MED ORDER — LIDOCAINE HCL 1 % IJ SOLN
INTRAMUSCULAR | Status: AC | PRN
  Administered 2016-05-07: 20 mL

## 2016-05-07 MED ORDER — HEPARIN SODIUM (PORCINE) 5000 UNIT/ML IJ SOLN
5000.0000 [IU] | Freq: Three times a day (TID) | INTRAMUSCULAR | Status: DC
  Administered 2016-05-08 – 2016-05-14 (×14): 5000 [IU] via SUBCUTANEOUS
  Filled 2016-05-07 (×15): qty 1

## 2016-05-07 NOTE — Sedation Documentation (Signed)
Successful HD cath placed  

## 2016-05-07 NOTE — Progress Notes (Signed)
Bilateral Upper Extremity Vein Map    Right Cephalic Unable to visualize.   Right Basilic  Segment Diameter Depth Comment  1. Axilla   Unable to visualize.  2. Mid upper arm 3.8469mm 26.857mm   3. Above [facility] 2.7432[facility] 19.781mm   4. In [facility] 1.2998mm 19.378mm   5. Below AC   Unable to visualize.  6. Mid forearm   Unable to visualize.  7. Wrist   Unable to visualize.                    Left Cephalic  Segment Diameter Depth Comment  1. Axilla 2.466mm 19.773mm   2. Mid upper arm 1.7681mm 7mm   3. Above AC 2.2417mm 3.4824mm   4. In [facility] 2.3465mm 3.193mm   5. Below AC   Unable to visualize due to IV location  6. Mid forearm 1.607mm 5.7494mm Thrombus  7. Wrist 1.9033mm 3.6703mm                   Left Basilic  Segment Diameter Depth Comment  1. Axilla   Unable to visualize.  2. Mid upper arm   Unable to visualize.  3. Above AC 2.4481mm 16.726mm   4. In [facility] 2.1858mm 14.351mm   5. Below AC   Unable to visualize.  6. Mid forearm   Unable to visualize.  7. Wrist   Unable to visualize.                   05/07/2016 5:10 PM Gertie FeyMichelle Kathrynne Kulinski, BS, RVT, RDCS, RDMS

## 2016-05-07 NOTE — Progress Notes (Signed)
[facility] Progress Note   Subjective:  Stood up with PT yesterday Motivated to become more mobile [facility]  Vitals:   05/06/16 1651 05/07/16 0000 05/07/16 0006 05/07/16 0456  BP: 105/88  (!) 143/60 (!) 142/65  Pulse: 87  84 88  Resp: 18  18 18   Temp: 98.2 F (36.8 C)  98 F (36.7 C) 98.1 F (36.7 C)  TempSrc: Oral  Oral Oral  SpO2: 95%  96% 94%  Weight:      Height:  5\' 6"  (1.676 m)      Exam: Gen morbidly obese, awake and alert Husband with her (spent the night) Speaks slowly Sclera anicteric, throat clear  No jvd or bruits Chest clear ant/lat, some scattered rales at the bases, no wheezing Rhythm regular,, normal heart sounds Abd soft ntnd no mass or ascites +bs Huge pannus Ext diffuse 2-3+ pitting edema of arms and lower legs Chronic skin changes/darkening of LE's Dressings on LE wounds   Inpatient medications: . amLODipine  10 mg Oral Daily  . atorvastatin  20 mg Oral Daily  . calcium acetate  667 mg Oral TID WC  . carvedilol  25 mg Oral BID WC  . [START ON 05/09/2016] darbepoetin (ARANESP) injection - NON-DIALYSIS  150 mcg Subcutaneous Q Thu-1800  . doxycycline  100 mg Oral Q12H  . ferrous sulfate  325 mg Oral TID WC  . furosemide  160 mg Intravenous Q6H  . [START ON 05/08/2016] heparin  5,000 Units Subcutaneous Q8H  . hydrALAZINE  100 mg Oral TID  . insulin aspart  0-5 Units Subcutaneous QHS  . insulin aspart  0-9 Units Subcutaneous TID WC  . isosorbide mononitrate  30 mg Oral Daily  . metolazone  10 mg Oral BID  . multivitamin with minerals  1 tablet Oral Daily  . polyethylene glycol  17 g Oral Daily  . potassium chloride  40 mEq Oral Daily  . senna-docusate  1 tablet Oral BID     Recent Labs Lab 05/03/16 1520 05/05/16 0543 05/06/16 0213  NA 138 142  142 142  K 3.2* 3.3*  3.3* 3.4*  CL 100* 95*  95* 97*  CO2 24 26  25 25   GLUCOSE 140* 163*  170* 173*  BUN 117* 131*  132* 130*   CREATININE 7.65* 7.18*  7.22* 7.01*  CALCIUM 8.2* 8.9  8.8* 8.7*  PHOS 8.8* 7.6* 7.2*    Recent Labs Lab 05/01/16 1218 05/02/16 0555 05/03/16 1520 05/05/16 0543 05/06/16 0213  AST 9* 10*  --   --   --   ALT 8* 7*  --   --   --   ALKPHOS 65 60  --   --   --   BILITOT 0.9 1.0  --   --   --   PROT 7.9 7.1  --   --   --   ALBUMIN 2.4* 2.2* 2.1* 2.0* 2.1*    Recent Labs Lab 05/01/16 1218  05/03/16 0533 05/05/16 0543 05/07/16 0737  WBC 14.0*  < > 12.2* 11.9* 10.7*  NEUTROABS 11.0*  --   --   --   --   HGB 8.3*  < > 8.2* 8.6* 9.5*  HCT 25.2*  < > 24.6*  25.4* 26.7* 28.8*  MCV 83.7  < > 84.2 85.9 87.5  PLT 428*  < > 455* 511* 456*  < > = values in this interval not displayed.   Iron/TIBC/Ferritin/ %Sat  Component Value Date/Time   IRON 74 03/22/2016 1835   TIBC 165 (L) 03/22/2016 1835   FERRITIN 457 (H) 03/22/2016 1835   IRONPCTSAT 45 (H) 03/22/2016 1835    Assessment: 1. 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  Creat increasing from 4-6 from Oct '17 - Feb '18.  Now creat is in 7-8 range.  Admitted with sig fluid overload, anasarca/pulm edema on CXR. Came in w sig fluid overload, pulm edema on CXR.  1. Diuresing somewhat and SOB is better. Per discussion w/ husband pt has been bedridden for some time but she says because of broken wheelchair - she worked with PT yesterday  and stood up (with much assist). She is determined that she will be a dialysis candidate.  2. Pt / husband are optimistic that with improvement of her edema she would be able to get OOB, transfer to [facility], etc.  3. All that said, with her very low GFR/volume overload - think we should move ahead with dialysis planning 4. For [facility] today by IR, vein mapping ordered, VVS consult, initiate HD post catheter insertion - will need TDC and AVF/AVG.  5. Since still making urine will leave lasix as is for now 2. Anemia - of CKD +/- other;  1. Transfused 2 U on 3/22.  2. Added  darbe 150/week.  3. Checking Fe studies 3. 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 Phos up.  1. Needs binders. Ca acetate 1 c/meals.  2. Renal diet. 4. Morbid obesity 5. Debility - as above 6. Hist CVA  Camille Bal, MD [facility] [phone] Pager 05/07/2016, 8:26 AM

## 2016-05-07 NOTE — Sedation Documentation (Signed)
Patient is resting comfortably. 

## 2016-05-07 NOTE — Progress Notes (Signed)
PT Cancellation Note  Patient Details Name: [name] MRN: [id number] DOB: 09/04/1975   Cancelled Treatment:    Reason Eval/Treat Not Completed: Patient at procedure or test/unavailable Spoke with RN and RN stated pt was at procedure currently, and will be at HD following procedure. Will reattempt as schedule allows.   Margot ChimesBrittany Smith, PT, DPT  Acute Rehabilitation Services  Pager: [phone]  Melvyn NovasBrittany L Smith 05/07/2016, 12:50 PM

## 2016-05-07 NOTE — Progress Notes (Signed)
Received pt back from IR after having right HD cath placed. Notified Dialysis and they will be dialyzing pt for her first treatment this evening .

## 2016-05-07 NOTE — Consult Note (Signed)
Chief Complaint: Patient was seen in consultation today for tunneled dialysis catheter placement Chief Complaint  Patient presents with  . wounds   at the request of Dr Charolette Forward  Referring Physician(s): Dr Charolette Forward  Supervising Physician: Simonne Come  Patient Status: [facility] - In-pt  History of Present Illness: [name] is a 41 y.o. female   CKD 4-5; progressive secondary DM and obesity Worsening renal failure Anasarca; pulmonary edema Fluid overload and short of breath Dr Eliott Nine feels necessary to move ahead with vein mapping and dialysis graft- OR soon Need tunneled dialysis catheter initially to start dialysis asap. Now scheduled for same  Past Medical History:  Diagnosis Date  . Anasarca 02/05/2015  . Asthma   . CHF (congestive heart failure) (HCC)    diastolic  . CVA (cerebral infarction) 1990's   "writing is not the same since" R leg weakness   . GERD (gastroesophageal reflux disease)   . Hypertension   . Morbid obesity (HCC)   . Palliative care encounter   . Stroke (HCC)   . Type II diabetes mellitus (HCC)     Past Surgical History:  Procedure Laterality Date  . INCISION AND DRAINAGE OF WOUND Left 08/30/2013   thigh necrotic wound/notes 08/30/2013  . IRRIGATION AND DEBRIDEMENT ABSCESS Left 08/30/2013   Procedure: IRRIGATION AND DEBRIDEMENT ABSCESS Left Thigh Necrotic Wound;  Surgeon: [name], MD;  Location: [facility] OR;  Service: General;  Laterality: Left;  . TEE WITHOUT CARDIOVERSION N/A 09/07/2013   Procedure: TRANSESOPHAGEAL ECHOCARDIOGRAM (TEE);  Surgeon: [name], MD;  Location: [facility] ENDOSCOPY;  Service: Cardiovascular;  Laterality: N/A;  . TEE WITHOUT CARDIOVERSION N/A 02/08/2015   Procedure: TRANSESOPHAGEAL ECHOCARDIOGRAM (TEE);  Surgeon: Chilton Si, MD;  Location: [facility] ENDOSCOPY;  Service: Cardiovascular;  Laterality: N/A;    Allergies: Azithromycin  Medications: Prior to Admission medications   Medication Sig Start  Date End Date Taking? Authorizing Provider  albuterol (PROAIR HFA) 108 (90 Base) MCG/ACT inhaler Inhale 2 puffs into the lungs every 6 (six) hours as needed for wheezing or shortness of breath. 08/12/15  Yes Charm Rings, MD  albuterol (PROVENTIL) (2.5 MG/3ML) 0.083% nebulizer solution Take 3 mLs (2.5 mg total) by nebulization every 6 (six) hours as needed for wheezing or shortness of breath. 08/12/15  Yes Charm Rings, MD  amLODipine (NORVASC) 10 MG tablet Take 1 tablet (10 mg total) by mouth daily. 01/28/16  Yes Jeffrey Hedges, PA-C  atorvastatin (LIPITOR) 20 MG tablet Take 20 mg by mouth daily.   Yes Historical Provider, MD  carvedilol (COREG) 25 MG tablet Take 1 tablet (25 mg total) by mouth 2 (two) times daily with a meal. 01/28/16  Yes Eyvonne Mechanic, PA-C  ferrous sulfate 325 (65 FE) MG tablet Take 1 tablet (325 mg total) by mouth 3 (three) times daily with meals. 01/28/16  Yes Jeffrey Hedges, PA-C  furosemide (LASIX) 80 MG tablet Take 2 tablets (160 mg total) by mouth every 6 (six) hours. 03/30/16  Yes Dron Jaynie Collins, MD  hydrALAZINE (APRESOLINE) 50 MG tablet Take 2 tablets (100 mg total) by mouth 3 (three) times daily. 03/30/16  Yes Dron Jaynie Collins, MD  insulin aspart (NOVOLOG) 100 UNIT/ML injection Inject 0-20 Units into the skin 3 (three) times daily with meals. CBG < 70: implement hypoglycemia protocol CBG 70 - 120: 0 units CBG 121 - 150: 3 units CBG 151 - 200: 4 units CBG 201 - 250: 7 units CBG 251 - 300: 11 units CBG  301 - 350: 15 units CBG 351 - 400: 20 units 01/28/16  Yes Jeffrey Hedges, PA-C  isosorbide mononitrate (IMDUR) 30 MG 24 hr tablet Take 1 tablet (30 mg total) by mouth daily. 01/28/16  Yes Jeffrey Hedges, PA-C  metolazone (ZAROXOLYN) 10 MG tablet Take 1 tablet (10 mg total) by mouth daily. 03/31/16  Yes Dron Jaynie CollinsPrasad Bhandari, MD  polyethylene glycol powder (GLYCOLAX/MIRALAX) powder Dissolve 1 capful into 4 to 8 oz of fluid 04/04/16  Yes Historical Provider, MD    potassium chloride SA (K-DUR,KLOR-CON) 20 MEQ tablet Take 2 tablets (40 mEq total) by mouth daily. 03/31/16  Yes Dron Jaynie CollinsPrasad Bhandari, MD  vitamin A 8000 UNIT capsule Take 8,000 Units by mouth daily.   Yes Historical Provider, MD  camphor-menthol [facility](SARNA) lotion Apply 1 application topically every 8 (eight) hours as needed for itching. 03/30/16   Dron Jaynie CollinsPrasad Bhandari, MD     Family History  Problem Relation Age of Onset  . Diabetes Mother   . Kidney disease Mother     Social History   Social History  . Marital status: Married    Spouse name: N/A  . Number of children: N/A  . Years of education: N/A   Social History Main Topics  . Smoking status: Never Smoker  . Smokeless tobacco: Never Used  . Alcohol use No  . Drug use: No  . Sexual activity: Not Asked   Other Topics Concern  . None   Social History Narrative  . None    Review of Systems: A 12 point ROS discussed and pertinent positives are indicated in the HPI above.  All other systems are negative.  Review of Systems  Constitutional: Positive for activity change, appetite change, fatigue and unexpected weight change. Negative for fever.  HENT: Negative for trouble swallowing.   Respiratory: Positive for shortness of breath. Negative for cough, choking, chest tightness and wheezing.   Cardiovascular: Negative for chest pain.  Gastrointestinal: Negative for abdominal pain.  Genitourinary: Negative for decreased urine volume.  Musculoskeletal: Negative for back pain.  Neurological: Positive for weakness.  Psychiatric/Behavioral: Negative for behavioral problems and confusion.    Vital Signs: BP (!) 142/65 (BP Location: Right Arm)   Pulse 88   Temp 98.1 F (36.7 C) (Oral)   Resp 18   Ht 5\' 6"  (1.676 m)   Wt (!) 357 lb (161.9 kg)   LMP 04/11/2016   SpO2 94%   BMI 57.62 kg/m   Physical Exam  Constitutional: She is oriented to person, place, and time.  Morbid obesity 357 lbs  Eyes: EOM are normal.   Cardiovascular: Normal rate and regular rhythm.   Pulmonary/Chest: Effort normal and breath sounds normal. She has no wheezes.  Abdominal: Soft. There is no tenderness.  Musculoskeletal: Normal range of motion.  Neurological: She is alert and oriented to person, place, and time.  Skin: Skin is warm and dry.  Psychiatric: She has a normal mood and affect. Her behavior is normal. Judgment and thought content normal.  Nursing note and vitals reviewed.   Mallampati Score:  MD Evaluation Airway: WNL Heart: WNL Abdomen: WNL Chest/ Lungs: WNL ASA  Classification: 3 Mallampati/Airway Score: Two  Imaging: Dg Chest 2 View  Result Date: 05/01/2016 CLINICAL DATA:  Fever and weakness. EXAM: CHEST  2 VIEW COMPARISON:  03/22/2016. FINDINGS: Cardiomegaly with mild bilateral interstitial prominence. No pleural effusion or pneumothorax. No acute bony abnormality. IMPRESSION: Cardiomegaly with mild bilateral interstitial prominence consistent with CHF. Findings have improved from prior study  of 03/22/2016. Given patient's history of fever pneumonitis cannot be excluded. Electronically Signed   By: Maisie Fus  Register   On: 05/01/2016 13:30   Ct Chest Wo Contrast  Result Date: 05/02/2016 CLINICAL DATA:  Morbid obesity week chronic kidney disease and diabetes. Complains of weakness and fever over the past week. Cough. EXAM: CT CHEST WITHOUT CONTRAST TECHNIQUE: Multidetector CT imaging of the chest was performed following the standard protocol without IV contrast. COMPARISON:  Chest x-ray 05/01/2016 FINDINGS: Cardiovascular: Mild to moderate cardiomegaly. Remaining vascular structures are unremarkable. Mediastinum/Nodes: No definite mediastinal or hilar adenopathy. Remaining mediastinal structures are unremarkable. Lungs/Pleura: Lungs are somewhat hypoinflated and demonstrate mild bibasilar linear atelectasis. Linear density over the posterior and anterior right middle lobe likely atelectasis. No evidence of  effusion. Airways are unremarkable. Upper Abdomen: Mild calcified plaque over the celiac axis, splenic artery and superior mesenteric arteries. Musculoskeletal: Minimal degenerative change of the spine. Prominent overlying soft tissues. IMPRESSION: Linear density over the right middle lobe likely atelectasis, although cannot completely exclude infection. Minimal linear bibasilar atelectasis. Moderate cardiomegaly. Electronically Signed   By: Elberta Fortis M.D.   On: 05/02/2016 15:53   Nm Pulmonary Perf And Vent  Result Date: 05/06/2016 CLINICAL DATA:  Chest pain EXAM: NUCLEAR MEDICINE VENTILATION - PERFUSION LUNG SCAN VIEWS: Anterior, posterior, RAO, LAO, RPO, LPO-ventilation and profusion. RADIOPHARMACEUTICALS:  32.6 mCi Technetium-66m DTPA aerosol inhalation and 4.2 mCi Technetium-12m MAA IV COMPARISON:  Chest radiograph May 06, 2016 FINDINGS: Ventilation: Radiotracer uptake is homogeneous and symmetric. No ventilation defects are evident. Perfusion: Radiotracer uptake is homogeneous and symmetric bilaterally. No perfusion defects evident. Cardiomegaly noted. IMPRESSION: No appreciable ventilation or perfusion defects. This study constitutes a very low probability of pulmonary embolus. Cardiomegaly noted. Electronically Signed   By: Bretta Bang III M.D.   On: 05/06/2016 14:19   Dg Chest Port 1 View  Result Date: 05/06/2016 CLINICAL DATA:  Acute onset of generalized chest pain. Initial encounter. EXAM: PORTABLE CHEST 1 VIEW COMPARISON:  Chest radiograph performed 05/01/2016, and CT of the chest performed 05/02/2016 FINDINGS: The lungs are hypoexpanded. Vascular congestion and vascular crowding are noted. Increased interstitial markings may reflect mild interstitial edema. There is no evidence of pleural effusion or pneumothorax. The cardiomediastinal silhouette is mildly enlarged. No acute osseous abnormalities are seen. IMPRESSION: Lungs hypoexpanded. Vascular congestion and mild cardiomegaly.  Increased interstitial markings could reflect mild interstitial edema. Electronically Signed   By: Roanna Raider M.D.   On: 05/06/2016 02:48    Labs:  CBC:  Recent Labs  05/02/16 0555 05/02/16 0830 05/02/16 1830 05/03/16 0533 05/05/16 0543  WBC 13.1* 13.4*  --  12.2* 11.9*  HGB 6.6* 6.8* 7.8* 8.2* 8.6*  HCT 19.2* 20.8* 23.6* 24.6*  25.4* 26.7*  PLT 498* 475*  --  455* 511*    COAGS: No results for input(s): INR, APTT in the last 8760 hours.  BMP:  Recent Labs  05/02/16 0555 05/03/16 0533 05/03/16 1520 05/05/16 0543 05/06/16 0213  NA 140  --  138 142  142 142  K 3.9  --  3.2* 3.3*  3.3* 3.4*  CL 99*  --  100* 95*  95* 97*  CO2 23  --  24 26  25 25   GLUCOSE 120*  --  140* 163*  170* 173*  BUN 108*  --  117* 131*  132* 130*  CALCIUM 8.4*  --  8.2* 8.9  8.8* 8.7*  CREATININE 7.90* 7.84* 7.65* 7.18*  7.22* 7.01*  GFRNONAA 6* 6* 6* 6*  6* 7*  GFRAA 7* 7* 7* 7*  7* 8*    LIVER FUNCTION TESTS:  Recent Labs  01/28/16 1237 03/22/16 0602  05/01/16 1218 05/02/16 0555 05/03/16 1520 05/05/16 0543 05/06/16 0213  BILITOT 0.7 0.6  --  0.9 1.0  --   --   --   AST 10* 8*  --  9* 10*  --   --   --   ALT 8* 7*  --  8* 7*  --   --   --   ALKPHOS 43 31*  --  65 60  --   --   --   PROT 7.3 7.1  --  7.9 7.1  --   --   --   ALBUMIN 2.1* 2.7*  < > 2.4* 2.2* 2.1* 2.0* 2.1*  < > = values in this interval not displayed.  TUMOR MARKERS: No results for input(s): AFPTM, CEA, CA199, CHROMGRNA in the last 8760 hours.  Assessment and Plan:  Worsening renal failure Need for long term dialysis Planned for vein map and OR for graft  Needs tunneled dialysis catheter to initiate dialysis asap per Dr Eliott Nine Risks and Benefits discussed with the patient including, but not limited to bleeding, infection, vascular injury, pneumothorax which may require chest tube placement, air embolism or even death All of the patient's questions were answered, patient is agreeable to  proceed. Consent signed and in chart.    Thank you for this interesting consult.  I greatly enjoyed meeting Reesha Debes and look forward to participating in their care.  A copy of this report was sent to the requesting provider on this date.  Electronically Signed: Ralene Muskrat A 05/07/2016, 7:59 AM   I spent a total of 20 Minutes    in face to face in clinical consultation, greater than 50% of which was counseling/coordinating care for tunneled HD catheter placement

## 2016-05-07 NOTE — Progress Notes (Signed)
[facility]                                                                              Patient Demographics  [name], is a 41 y.o. female, DOB - 05-May-1975, RUE:[id number]  Admit date - 05/01/2016   Admitting Physician Costin Otelia Sergeant, MD  Outpatient Primary MD for the patient is Marletta Lor, NP  Outpatient specialists:   LOS - 6  days    Chief Complaint  Patient presents with  . wounds       Brief summary  41 y.o.femalewith medical history significant for morbid obesity, chronic kidney disease stage IV-V, diabetes mellitus, hypertension, prior CVA, diastolic CHF who presented to ED with progressive weakness, fever for past 1 week prior to this admission. She is actually bed bound for past year or so per her husband report ever since stroke. She also had dry cough and shortness of breath for past 1 week. She was hemodynamically stable on admission. Her creatinine was 7.9 (much worse since her recent Cr values of 4-5). Renal has seen her in consultation. Due to her debility she is not a good candidate for HD. Palliative care was consulted as well.      Assessment & Plan   Acute metabolic encephalopathy - Improving, at baseline mental status per family, sister and husband at the bedside  - Likely multifactorial from history of CVA, end stage renal disease, possible uremia, acute infectious process - possible pneumonia  Chest pain episode  -  Troponin 1 was 0.04 however d-dimer was 2.01 - VQ scan Negative for any PE no further chest pain episodes  Acute renal failure superimposed on CKD stage 5 not on HD / Acute pulmonary edema  - Due to progressive disease from uncontrolled diabetes and FSG - Continue IV lasix and metolazone per renal recommendations  - Tunneled dialysis cath placement today - Patient is more motivated and stood up with PT, agreeable to dialysis initiation.  Acute respiratory failure with hypoxia / Right middle lobe  pneumonia / Leukocytosis  - CT chest without contrast showed linear density over the right middle lobe likely atelectasis, although cannot completely exclude infection - She is on empiric doxycycline  - Blood cultures 1/2 showed colitis negative staph likely contaminant    Anemia of chronic disease / IDA - Due to CKD - Status post 2 units PRBC transfusion on 05/02/2016  - Aranesp 3/22, continue iron supplementation, B12  Uncontrolled diabetes mellitus with diabetic nephropathy with long term insulin use - Continue current insulin regimen: SSI  Dyslipidemia associated with type 2 DM - Continue statin therapy   Essential hypertension - Continue Norvasc, coreg, hydralazine, Imdur   Bilateral inner thigh wounds - Related to moisture and friction from thighs rubbing together - Left inner thigh has large area with many open areas scattered throughout, 17cm x 12cm x 0.1cm  - Right inner thigh wound is 8cm x 5cm x 0.3cm  - Dressing procedure/placement/frequency: Cleanse with NS, gently pat dry, apply Xeroform gauze, top with dry gauze,change every shift. Pt is already on a low air loss mattress  Morbid obesity / Debility  -  Nutrition consulted - Body mass index is 54.58 kg/m.  Code Status: full  DVT Prophylaxis:  heparin  Family Communication: Discussed in detail with the patient, all imaging results, lab results explained to the patient and husband   Disposition Plan: CIR consult placed  Time Spent in minutes 25 minutes  Procedures:    Consultants:    NephrologyDr. Delano Metz  WOC  Palliative care   Nutrition  Antimicrobials:  Doxycycline 3/21->   Medications  Scheduled Meds: . amLODipine  10 mg Oral Daily  . atorvastatin  20 mg Oral Daily  . calcium acetate  667 mg Oral TID WC  . carvedilol  25 mg Oral BID WC  .  ceFAZolin (ANCEF) IV  3 g Intravenous to XRAY  . [START ON 05/09/2016] darbepoetin (ARANESP) injection - NON-DIALYSIS  150 mcg Subcutaneous  Q Thu-1800  . doxycycline  100 mg Oral Q12H  . ferrous sulfate  325 mg Oral TID WC  . furosemide  160 mg Intravenous Q6H  . [START ON 05/08/2016] heparin  5,000 Units Subcutaneous Q8H  . hydrALAZINE  100 mg Oral TID  . insulin aspart  0-5 Units Subcutaneous QHS  . insulin aspart  0-9 Units Subcutaneous TID WC  . isosorbide mononitrate  30 mg Oral Daily  . metolazone  10 mg Oral BID  . multivitamin with minerals  1 tablet Oral Daily  . polyethylene glycol  17 g Oral Daily  . potassium chloride  40 mEq Oral Daily  . senna-docusate  1 tablet Oral BID   Continuous Infusions: PRN Meds:.sodium chloride, sodium chloride, acetaminophen, albuterol, alteplase, camphor-menthol, guaiFENesin-dextromethorphan, heparin, [START ON 05/08/2016] heparin, HYDROcodone-acetaminophen, lidocaine (PF), lidocaine-prilocaine, morphine injection, nitroGLYCERIN, ondansetron **OR** ondansetron (ZOFRAN) IV, pentafluoroprop-tetrafluoroeth   Antibiotics   Anti-infectives    Start     Dose/Rate Route Frequency Ordered Stop   05/07/16 1245  ceFAZolin (ANCEF) 3 g in dextrose 5 % 50 mL IVPB     3 g 130 mL/hr over 30 Minutes Intravenous To Radiology 05/07/16 1232 05/08/16 1245   05/02/16 2200  doxycycline (VIBRA-TABS) tablet 100 mg     100 mg Oral Every 12 hours 05/02/16 1732     05/01/16 2200  piperacillin-tazobactam (ZOSYN) IVPB 2.25 g  Status:  Discontinued     2.25 g 100 mL/hr over 30 Minutes Intravenous Every 8 hours 05/01/16 1324 05/02/16 1729   05/01/16 1330  vancomycin (VANCOCIN) 2,500 mg in sodium chloride 0.9 % 500 mL IVPB     2,500 mg 250 mL/hr over 120 Minutes Intravenous  Once 05/01/16 1309 05/01/16 1551   05/01/16 1300  piperacillin-tazobactam (ZOSYN) IVPB 3.375 g     3.375 g 100 mL/hr over 30 Minutes Intravenous  Once 05/01/16 1258 05/01/16 1340   05/01/16 1300  vancomycin (VANCOCIN) IVPB 1000 mg/200 mL premix  Status:  Discontinued     1,000 mg 200 mL/hr over 60 Minutes Intravenous  Once 05/01/16 1258  05/01/16 1308        Subjective:   [name] was seen and examined today.  No further chest pain episodes. Currently no complaints except being tired. Husband at the bedside. Patient denies dizziness, shortness of breath, abdominal pain, N/V/D/C, new weakness, numbess, tingling.   Objective:   Vitals:   05/07/16 0006 05/07/16 0456 05/07/16 1000 05/07/16 1200  BP: (!) 143/60 (!) 142/65 (!) 145/73 (!) 149/75  Pulse: 84 88 92 96  Resp: 18 18 18    Temp: 98 F (36.7 C) 98.1 F (36.7 C) 98.8 F (  37.1 C)   TempSrc: Oral Oral Oral   SpO2: 96% 94% 94%   Weight:      Height:        Intake/Output Summary (Last 24 hours) at 05/07/16 1232 Last data filed at 05/07/16 1000  Gross per 24 hour  Intake              270 ml  Output             2075 ml  Net            -1805 ml     Wt Readings from Last 3 Encounters:  05/05/16 (!) 161.9 kg (357 lb)  03/29/16 (!) 167 kg (368 lb 3.2 oz)  03/09/16 (!) 168.3 kg (371 lb)     Exam  General: Alert andComfortable  HEENT:  Neck: Supple, no JVD, no masses  Cardiovascular: S1 S2 clear, RRR  Respiratory: Clear to auscultation bilaterally, no wheezing, rales or rhonchi  Gastrointestinal: Morbidly obese, Soft, nontender, nondistended, + bowel sounds  Ext: no cyanosis clubbing or edema, dressing in place on the thigh  Neuro: no new deficits  Skin: No rashes  Psych: Normal affect and demeanor, alert and oriented x3    Data Reviewed:  I have personally reviewed following labs and imaging studies  Micro Results Recent Results (from the past 240 hour(s))  Urine culture     Status: None   Collection Time: 05/01/16 11:44 AM  Result Value Ref Range Status   Specimen Description URINE, RANDOM  Final   Special Requests NONE  Final   Culture   Final    NO GROWTH Performed at [facility], [address], [place], [place] [address]    Report Status 05/02/2016 FINAL  Final  Blood Culture (routine x 2)     Status:  Abnormal   Collection Time: 05/01/16 12:20 PM  Result Value Ref Range Status   Specimen Description BLOOD LEFT ANTECUBITAL  Final   Special Requests BOTTLES DRAWN AEROBIC AND ANAEROBIC 5CC  Final   Culture  Setup Time   Final    GRAM POSITIVE COCCI IN CLUSTERS AEROBIC BOTTLE ONLY CRITICAL RESULT CALLED TO, READ BACK BY AND VERIFIED WITH: T GREEN PHARMD [id number] 05/02/16 A BROWNING    Culture (A)  Final    STAPHYLOCOCCUS SPECIES (COAGULASE NEGATIVE) THE SIGNIFICANCE OF ISOLATING THIS ORGANISM FROM A SINGLE SET OF BLOOD CULTURES WHEN MULTIPLE SETS ARE DRAWN IS UNCERTAIN. PLEASE NOTIFY THE MICROBIOLOGY DEPARTMENT WITHIN ONE WEEK IF SPECIATION AND SENSITIVITIES ARE REQUIRED. Performed at [facility], [address] [place], [place] [address]    Report Status 05/04/2016 FINAL  Final  Blood Culture ID Panel (Reflexed)     Status: None   Collection Time: 05/01/16 12:20 PM  Result Value Ref Range Status   Enterococcus species NOT DETECTED NOT DETECTED Final   Listeria monocytogenes NOT DETECTED NOT DETECTED Final   Staphylococcus species NOT DETECTED NOT DETECTED Final   Staphylococcus aureus NOT DETECTED NOT DETECTED Final   Streptococcus species NOT DETECTED NOT DETECTED Final   Streptococcus agalactiae NOT DETECTED NOT DETECTED Final   Streptococcus pneumoniae NOT DETECTED NOT DETECTED Final   Streptococcus pyogenes NOT DETECTED NOT DETECTED Final   Acinetobacter baumannii NOT DETECTED NOT DETECTED Final   Enterobacteriaceae species NOT DETECTED NOT DETECTED Final   Enterobacter cloacae complex NOT DETECTED NOT DETECTED Final   Escherichia coli NOT DETECTED NOT DETECTED Final   Klebsiella oxytoca NOT DETECTED NOT DETECTED Final  Klebsiella pneumoniae NOT DETECTED NOT DETECTED Final   Proteus species NOT DETECTED NOT DETECTED Final   Serratia marcescens NOT DETECTED NOT DETECTED Final   Haemophilus influenzae NOT DETECTED NOT DETECTED Final   Neisseria meningitidis NOT DETECTED NOT  DETECTED Final   Pseudomonas aeruginosa NOT DETECTED NOT DETECTED Final   Candida albicans NOT DETECTED NOT DETECTED Final   Candida glabrata NOT DETECTED NOT DETECTED Final   Candida krusei NOT DETECTED NOT DETECTED Final   Candida parapsilosis NOT DETECTED NOT DETECTED Final   Candida tropicalis NOT DETECTED NOT DETECTED Final    Comment: Performed at [facility], [address], [place], [place] [address]  Blood Culture (routine x 2)     Status: None   Collection Time: 05/01/16 12:26 PM  Result Value Ref Range Status   Specimen Description BLOOD LEFT HAND  Final   Special Requests BOTTLES DRAWN AEROBIC AND ANAEROBIC 5CC  Final   Culture   Final    NO GROWTH 5 DAYS Performed at [facility], [address] [place], [place] [address]    Report Status 05/06/2016 FINAL  Final    Radiology Reports Dg Chest 2 View  Result Date: 05/01/2016 CLINICAL DATA:  Fever and weakness. EXAM: CHEST  2 VIEW COMPARISON:  03/22/2016. FINDINGS: Cardiomegaly with mild bilateral interstitial prominence. No pleural effusion or pneumothorax. No acute bony abnormality. IMPRESSION: Cardiomegaly with mild bilateral interstitial prominence consistent with CHF. Findings have improved from prior study of 03/22/2016. Given patient's history of fever pneumonitis cannot be excluded. Electronically Signed   By: Maisie Fus  Register   On: 05/01/2016 13:30   Ct Chest Wo Contrast  Result Date: 05/02/2016 CLINICAL DATA:  Morbid obesity week chronic kidney disease and diabetes. Complains of weakness and fever over the past week. Cough. EXAM: CT CHEST WITHOUT CONTRAST TECHNIQUE: Multidetector CT imaging of the chest was performed following the standard protocol without IV contrast. COMPARISON:  Chest x-ray 05/01/2016 FINDINGS: Cardiovascular: Mild to moderate cardiomegaly. Remaining vascular structures are unremarkable. Mediastinum/Nodes: No definite mediastinal or hilar adenopathy. Remaining mediastinal structures are  unremarkable. Lungs/Pleura: Lungs are somewhat hypoinflated and demonstrate mild bibasilar linear atelectasis. Linear density over the posterior and anterior right middle lobe likely atelectasis. No evidence of effusion. Airways are unremarkable. Upper Abdomen: Mild calcified plaque over the celiac axis, splenic artery and superior mesenteric arteries. Musculoskeletal: Minimal degenerative change of the spine. Prominent overlying soft tissues. IMPRESSION: Linear density over the right middle lobe likely atelectasis, although cannot completely exclude infection. Minimal linear bibasilar atelectasis. Moderate cardiomegaly. Electronically Signed   By: Elberta Fortis M.D.   On: 05/02/2016 15:53   Nm Pulmonary Perf And Vent  Result Date: 05/06/2016 CLINICAL DATA:  Chest pain EXAM: NUCLEAR MEDICINE VENTILATION - PERFUSION LUNG SCAN VIEWS: Anterior, posterior, RAO, LAO, RPO, LPO-ventilation and profusion. RADIOPHARMACEUTICALS:  32.6 mCi Technetium-93m DTPA aerosol inhalation and 4.2 mCi Technetium-73m MAA IV COMPARISON:  Chest radiograph May 06, 2016 FINDINGS: Ventilation: Radiotracer uptake is homogeneous and symmetric. No ventilation defects are evident. Perfusion: Radiotracer uptake is homogeneous and symmetric bilaterally. No perfusion defects evident. Cardiomegaly noted. IMPRESSION: No appreciable ventilation or perfusion defects. This study constitutes a very low probability of pulmonary embolus. Cardiomegaly noted. Electronically Signed   By: Bretta Bang III M.D.   On: 05/06/2016 14:19   Dg Chest Port 1 View  Result Date: 05/06/2016 CLINICAL DATA:  Acute onset of generalized chest pain. Initial encounter. EXAM: PORTABLE CHEST 1 VIEW COMPARISON:  Chest radiograph performed 05/01/2016, and  CT of the chest performed 05/02/2016 FINDINGS: The lungs are hypoexpanded. Vascular congestion and vascular crowding are noted. Increased interstitial markings may reflect mild interstitial edema. There is no evidence  of pleural effusion or pneumothorax. The cardiomediastinal silhouette is mildly enlarged. No acute osseous abnormalities are seen. IMPRESSION: Lungs hypoexpanded. Vascular congestion and mild cardiomegaly. Increased interstitial markings could reflect mild interstitial edema. Electronically Signed   By: Roanna RaiderJeffery  Chang M.D.   On: 05/06/2016 02:48    Lab Data:  CBC:  Recent Labs Lab 05/01/16 1218 05/02/16 0555 05/02/16 0830 05/02/16 1830 05/03/16 0533 05/05/16 0543 05/07/16 0737  WBC 14.0* 13.1* 13.4*  --  12.2* 11.9* 10.7*  NEUTROABS 11.0*  --   --   --   --   --   --   HGB 8.3* 6.6* 6.8* 7.8* 8.2* 8.6* 9.5*  HCT 25.2* 19.2* 20.8* 23.6* 24.6*  25.4* 26.7* 28.8*  MCV 83.7 83.8 82.9  --  84.2 85.9 87.5  PLT 428* 498* 475*  --  455* 511* 456*   Basic Metabolic Panel:  Recent Labs Lab 05/02/16 0555 05/03/16 0533 05/03/16 1520 05/05/16 0543 05/06/16 0213 05/07/16 0737  NA 140  --  138 142  142 142 143  K 3.9  --  3.2* 3.3*  3.3* 3.4* 3.7  CL 99*  --  100* 95*  95* 97* 98*  CO2 23  --  24 26  25 25 27   GLUCOSE 120*  --  140* 163*  170* 173* 169*  BUN 108*  --  117* 131*  132* 130* 129*  CREATININE 7.90* 7.84* 7.65* 7.18*  7.22* 7.01* 6.66*  CALCIUM 8.4*  --  8.2* 8.9  8.8* 8.7* 8.8*  PHOS  --   --  8.8* 7.6* 7.2* 6.6*   GFR: Estimated Creatinine Clearance: 18 mL/min (A) (by C-G formula based on SCr of 6.66 mg/dL (H)). Liver Function Tests:  Recent Labs Lab 05/01/16 1218 05/02/16 0555 05/03/16 1520 05/05/16 0543 05/06/16 0213 05/07/16 0737  AST 9* 10*  --   --   --   --   ALT 8* 7*  --   --   --   --   ALKPHOS 65 60  --   --   --   --   BILITOT 0.9 1.0  --   --   --   --   PROT 7.9 7.1  --   --   --   --   ALBUMIN 2.4* 2.2* 2.1* 2.0* 2.1* 2.1*   No results for input(s): LIPASE, AMYLASE in the last 168 hours. No results for input(s): AMMONIA in the last 168 hours. Coagulation Profile:  Recent Labs Lab 05/07/16 0737  INR 1.70   Cardiac  Enzymes:  Recent Labs Lab 05/06/16 0213  TROPONINI 0.04*   BNP (last 3 results) No results for input(s): PROBNP in the last 8760 hours. HbA1C: No results for input(s): HGBA1C in the last 72 hours. CBG:  Recent Labs Lab 05/06/16 1149 05/06/16 1650 05/07/16 0012 05/07/16 0743 05/07/16 1153  GLUCAP 174* 174* 179* 166* 147*   Lipid Profile: No results for input(s): CHOL, HDL, LDLCALC, TRIG, CHOLHDL, LDLDIRECT in the last 72 hours. Thyroid Function Tests: No results for input(s): TSH, T4TOTAL, FREET4, T3FREE, THYROIDAB in the last 72 hours. Anemia Panel:  Recent Labs  05/07/16 0737  FERRITIN 1,069*  TIBC NOT CALCULATED  IRON 24*   Urine analysis:    Component Value Date/Time   COLORURINE STRAW (A) 05/01/2016 1144  APPEARANCEUR CLEAR 05/01/2016 1144   LABSPEC 1.009 05/01/2016 1144   PHURINE 5.0 05/01/2016 1144   GLUCOSEU NEGATIVE 05/01/2016 1144   HGBUR SMALL (A) 05/01/2016 1144   BILIRUBINUR NEGATIVE 05/01/2016 1144   KETONESUR NEGATIVE 05/01/2016 1144   PROTEINUR 30 (A) 05/01/2016 1144   UROBILINOGEN 0.2 10/26/2013 1721   NITRITE NEGATIVE 05/01/2016 1144   LEUKOCYTESUR NEGATIVE 05/01/2016 1144     Macklen Wilhoite M.D. [facility] 05/07/2016, 12:32 PM  Pager: [phone] Between 7am to 7pm - call Pager - [phone]  After 7pm go to [email or web address] - password [email or web address]  Call night coverage person covering after 7pm

## 2016-05-07 NOTE — Procedures (Signed)
  Pre-operative Diagnosis: CKD and needs hemodialysis       Post-operative Diagnosis: CKD and needs hemodialysis   Indications: Needs hemodialysis  Procedure: Placement of tunneled HD catheter  Findings: Rt IJ access.  Tip at SVC/RA junction  Complications: None     EBL: Minimal  Plan: Catheter is ready for use.

## 2016-05-07 NOTE — Consult Note (Addendum)
History of Present Illness:  Patient is a 41 y.o. year old female with medical history significant of morbid obesity, chronic kidney disease stage IV-V, diabetes mellitus, hypertension, prior CVA (right LE weakness), diastolic CHF, presents to the emergency room with chief complaint of weakness as well as fever.  Poor historian, husband at bedside. Uses a WC for mobility at home lives with husband.      Past Medical History:  Diagnosis Date  . Anasarca 02/05/2015  . Asthma   . CHF (congestive heart failure) (HCC)    diastolic  . CVA (cerebral infarction) 1990's   "writing is not the same since" R leg weakness   . GERD (gastroesophageal reflux disease)   . Hypertension   . Morbid obesity (HCC)   . Palliative care encounter   . Stroke (HCC)   . Type II diabetes mellitus (HCC)     Past Surgical History:  Procedure Laterality Date  . INCISION AND DRAINAGE OF WOUND Left 08/30/2013   thigh necrotic wound/notes 08/30/2013  . IR GENERIC HISTORICAL  05/07/2016   IR FLUORO GUIDE CV LINE RIGHT 05/07/2016 Richarda Overlie, MD [facility]  . IR GENERIC HISTORICAL  05/07/2016   IR US GUIDE VASC ACCESS RIGHT 05/07/2016 Richarda Overlie, MD [facility]  . IRRIGATION AND DEBRIDEMENT ABSCESS Left 08/30/2013   Procedure: IRRIGATION AND DEBRIDEMENT ABSCESS Left Thigh Necrotic Wound;  Surgeon: [name], MD;  Location: [facility] OR;  Service: General;  Laterality: Left;  . TEE WITHOUT CARDIOVERSION N/A 09/07/2013   Procedure: TRANSESOPHAGEAL ECHOCARDIOGRAM (TEE);  Surgeon: [name], MD;  Location: [facility] ENDOSCOPY;  Service: Cardiovascular;  Laterality: N/A;  . TEE WITHOUT CARDIOVERSION N/A 02/08/2015   Procedure: TRANSESOPHAGEAL ECHOCARDIOGRAM (TEE);  Surgeon: Chilton Si, MD;  Location: [facility] ENDOSCOPY;  Service: Cardiovascular;  Laterality: N/A;     Social History Social History  Substance Use Topics  . Smoking status: Never Smoker  . Smokeless tobacco: Never Used  . Alcohol use No    Family  History Family History  Problem Relation Age of Onset  . Diabetes Mother   . Kidney disease Mother     Allergies  Allergies  Allergen Reactions  . Azithromycin Other (See Comments)    Nose bleeding event     Current Facility-Administered Medications  Medication Dose Route Frequency Provider Last Rate Last Dose  . 0.9 %  sodium chloride infusion  100 mL Intravenous PRN Delano Metz, MD      . 0.9 %  sodium chloride infusion  100 mL Intravenous PRN Delano Metz, MD      . acetaminophen (TYLENOL) tablet 650 mg  650 mg Oral Q4H PRN Calvert Cantor, MD   650 mg at 05/03/16 1508  . albuterol (PROVENTIL) (2.5 MG/3ML) 0.083% nebulizer solution 2.5 mg  2.5 mg Nebulization Q6H PRN Leatha Gilding, MD   2.5 mg at 05/03/16 1617  . alteplase (CATHFLO ACTIVASE) injection 2 mg  2 mg Intracatheter Once PRN Delano Metz, MD      . amLODipine (NORVASC) tablet 10 mg  10 mg Oral Daily Costin Otelia Sergeant, MD   10 mg at 05/07/16 1213  . atorvastatin (LIPITOR) tablet 20 mg  20 mg Oral Daily Costin Otelia Sergeant, MD   20 mg at 05/07/16 1213  . calcium acetate (PHOSLO) capsule 667 mg  667 mg Oral TID WC Camille Bal, MD      . camphor-menthol [facility]) lotion 1 application  1 application Topical Q8H PRN Costin Otelia Sergeant, MD      .  carvedilol (COREG) tablet 25 mg  25 mg Oral BID WC Costin Otelia SergeantM Gherghe, MD   25 mg at 05/07/16 1213  . [START ON 05/09/2016] Darbepoetin Alfa (ARANESP) injection 150 mcg  150 mcg Subcutaneous Q Thu-1800 Ripudeep K Rai, MD      . doxycycline (VIBRA-TABS) tablet 100 mg  100 mg Oral Q12H Calvert CantorSaima Rizwan, MD   100 mg at 05/07/16 1212  . fentaNYL (SUBLIMAZE) 100 MCG/2ML injection           . ferrous sulfate tablet 325 mg  325 mg Oral TID WC Leatha Gildingostin M Gherghe, MD   325 mg at 05/06/16 1713  . furosemide (LASIX) 160 mg in dextrose 5 % 50 mL IVPB  160 mg Intravenous Q6H Delano Metzobert Schertz, MD   160 mg at 05/07/16 1214  . gelatin adsorbable (GELFOAM/SURGIFOAM) 12-7 MM sponge 12-7 mm           .  guaiFENesin-dextromethorphan (ROBITUSSIN DM) 100-10 MG/5ML syrup 5 mL  5 mL Oral Q4H PRN Leatha Gildingostin M Gherghe, MD   5 mL at 05/01/16 1620  . heparin injection 1,000 Units  1,000 Units Dialysis PRN Delano Metzobert Schertz, MD      . Melene Muller[START ON 05/08/2016] heparin injection 4,000 Units  4,000 Units Dialysis PRN Delano Metzobert Schertz, MD      . Melene Muller[START ON 05/08/2016] heparin injection 5,000 Units  5,000 Units Subcutaneous Q8H Ralene MuskratPamela Turpin, PA-C      . hydrALAZINE (APRESOLINE) tablet 100 mg  100 mg Oral TID Leatha Gildingostin M Gherghe, MD   100 mg at 05/07/16 1218  . HYDROcodone-acetaminophen (NORCO) 10-325 MG per tablet 1 tablet  1 tablet Oral Q6H PRN Bobette Moavid Manuel Ortiz, MD   1 tablet at 05/07/16 [id number]  . insulin aspart (novoLOG) injection 0-5 Units  0-5 Units Subcutaneous QHS Costin Otelia SergeantM Gherghe, MD      . insulin aspart (novoLOG) injection 0-9 Units  0-9 Units Subcutaneous TID WC Costin Otelia SergeantM Gherghe, MD   2 Units at 05/06/16 1724  . isosorbide mononitrate (IMDUR) 24 hr tablet 30 mg  30 mg Oral Daily Costin Otelia SergeantM Gherghe, MD   30 mg at 05/07/16 1211  . lidocaine (PF) (XYLOCAINE) 1 % injection 5 mL  5 mL Intradermal PRN Delano Metzobert Schertz, MD      . lidocaine (PF) (XYLOCAINE) 1 % injection           . lidocaine-prilocaine (EMLA) cream 1 application  1 application Topical PRN Delano Metzobert Schertz, MD      . metolazone (ZAROXOLYN) tablet 10 mg  10 mg Oral BID Delano Metzobert Schertz, MD   10 mg at 05/07/16 1211  . midazolam (VERSED) 2 MG/2ML injection           . morphine 2 MG/ML injection 1 mg  1 mg Intravenous Q4H PRN Ripudeep Jenna LuoK Rai, MD   1 mg at 05/07/16 0132  . multivitamin with minerals tablet 1 tablet  1 tablet Oral Daily Calvert CantorSaima Rizwan, MD   1 tablet at 05/07/16 1211  . nitroGLYCERIN (NITROSTAT) SL tablet 0.4 mg  0.4 mg Sublingual Q5 min PRN Eduard ClosArshad N Kakrakandy, MD   0.4 mg at 05/06/16 0217  . ondansetron (ZOFRAN) tablet 4 mg  4 mg Oral Q6H PRN Leatha Gildingostin M Gherghe, MD   4 mg at 05/06/16 2115   Or  . ondansetron (ZOFRAN) injection 4 mg  4 mg Intravenous Q6H PRN  Leatha Gildingostin M Gherghe, MD   4 mg at 05/02/16 0906  . pentafluoroprop-tetrafluoroeth (GEBAUERS) aerosol 1 application  1 application Topical PRN  Delano Metz, MD      . polyethylene glycol [facility]) packet 17 g  17 g Oral Daily Ranae Palms, NP   17 g at 05/06/16 1712  . potassium chloride SA (K-DUR,KLOR-CON) CR tablet 40 mEq  40 mEq Oral Daily Ripudeep Jenna Luo, MD   40 mEq at 05/07/16 1212  . senna-docusate (Senokot-S) tablet 1 tablet  1 tablet Oral BID Ranae Palms, NP   1 tablet at 05/07/16 1212    ROS:   General:  No weight loss, positive Fever, chills  HEENT: No recent headaches, no nasal bleeding, no visual changes, no sore throat  Neurologic: No dizziness, blackouts, seizures. No recent symptoms of stroke or mini- stroke. No recent episodes of slurred speech, or temporary blindness.  Cardiac: No recent episodes of chest pain/pressure, no shortness of breath at rest.  No shortness of breath with exertion.  Denies history of atrial fibrillation or irregular heartbeat  Vascular: No history of rest pain in feet.  No history of claudication.  No history of non-healing ulcer, No history of DVT   Pulmonary: No home oxygen, no productive cough, no hemoptysis,  No asthma or wheezing  Musculoskeletal:  [ ]  Arthritis, [ ]  Low back pain,  [ ]  Joint pain  Hematologic:No history of hypercoagulable state.  No history of easy bleeding.  No history of anemia  Gastrointestinal: No hematochezia or melena,  No gastroesophageal reflux, no trouble swallowing  Urinary: [x ] chronic Kidney disease, [ ]  on HD - [ ]  MWF or [ ]  TTHS, [ ]  Burning with urination, [ ]  Frequent urination, [ ]  Difficulty urinating;   Skin: No rashes, B thigh wounds/rash  Psychological: No history of anxiety,  No history of depression   Physical Examination  Vitals:   05/07/16 1333 05/07/16 1338 05/07/16 1341 05/07/16 1346  BP: (!) 180/77 (!) 169/91 (!) 162/86 (!) 163/73  Pulse: 95 97 95 95  Resp:  (!) 22 (!) 8 20 (!) 21  Temp:      TempSrc:      SpO2: 100% 100% 100% 100%  Weight:      Height:        Body mass index is 57.62 kg/m.  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   PLAN: Pending vein mapping we will place AV fistula verse graft likely in the left UE.  She has significant edema in the right UE and is right hand dominant.     Thomasena Edis, EMMA MAUREEN PA-C [facility]  I agree with the above.  I have seen and examined the patient.I discussed with family that we need to proceed with permanent dialysis access.  She is right handed and has edema in her right hand.  I told the family we should proceed with a left arm access.  She currently has 2 PIVs in her left arm which will be removed.  She is on Doxycycline for possible pneumonia.  She had 1/2 blood cx positive for coag negative staph, which is thought to be a  contaminant.  She has a palpable left brachial pulse.  After reviewing her vein map, it is likely that she will not be a candidate for a fistula but will likely need a graft.  Her veins will be evaluated in the OR to confirm she is not a fistula candidate.  If not a AVGG will be placed.  She will be tentatively placed on the OR schedule tomorrow.  NPO after midnight.  She was initially thought not to be a candidate for dialysis because she is bed ridden.  However after working with PT and family discussions as well as a Palliative Care consult, the decision was  made to initiate dialysis.  Durene Cal

## 2016-05-08 ENCOUNTER — Encounter (HOSPITAL_COMMUNITY): Payer: Self-pay | Admitting: Anesthesiology

## 2016-05-08 ENCOUNTER — Encounter (HOSPITAL_COMMUNITY)
Admission: EM | Disposition: A | Discharge status: Skilled Nursing Facility | Payer: Self-pay | Source: Home / Self Care | Attending: Internal Medicine

## 2016-05-08 LAB — BASIC METABOLIC PANEL
Anion gap: 15 (ref 5–15)
BUN: 131 mg/dL — AB (ref 6–20)
CALCIUM: 8.7 mg/dL — AB (ref 8.9–10.3)
CHLORIDE: 98 mmol/L — AB (ref 101–111)
CO2: 29 mmol/L (ref 22–32)
CREATININE: 6.59 mg/dL — AB (ref 0.44–1.00)
GFR calc Af Amer: 8 mL/min — ABNORMAL LOW (ref >60)
GFR calc non Af Amer: 7 mL/min — ABNORMAL LOW (ref >60)
Glucose, Bld: 169 mg/dL — ABNORMAL HIGH (ref 65–99)
Potassium: 3.7 mmol/L (ref 3.5–5.1)
Sodium: 142 mmol/L (ref 135–145)

## 2016-05-08 LAB — CBC
HEMATOCRIT: 27.2 % — AB (ref 36.0–46.0)
HEMOGLOBIN: 8.7 g/dL — AB (ref 12.0–15.0)
MCH: 27.9 pg (ref 26.0–34.0)
MCHC: 32 g/dL (ref 30.0–36.0)
MCV: 87.2 fL (ref 78.0–100.0)
Platelets: 423 10*3/uL — ABNORMAL HIGH (ref 150–400)
RBC: 3.12 MIL/uL — ABNORMAL LOW (ref 3.87–5.11)
RDW: 14.5 % (ref 11.5–15.5)
WBC: 11.3 10*3/uL — ABNORMAL HIGH (ref 4.0–10.5)

## 2016-05-08 LAB — PROTIME-INR
INR: 1.9
Prothrombin Time: 22 seconds — ABNORMAL HIGH (ref 11.4–15.2)

## 2016-05-08 LAB — GLUCOSE, CAPILLARY
GLUCOSE-CAPILLARY: 173 mg/dL — AB (ref 65–99)
Glucose-Capillary: 157 mg/dL — ABNORMAL HIGH (ref 65–99)

## 2016-05-08 LAB — HEPATITIS B SURFACE ANTIGEN: Hepatitis B Surface Ag: NEGATIVE

## 2016-05-08 SURGERY — ARTERIOVENOUS (AV) FISTULA CREATION
Anesthesia: Monitor Anesthesia Care | Laterality: Left

## 2016-05-08 MED ORDER — RENA-VITE PO TABS
1.0000 | ORAL_TABLET | Freq: Every day | ORAL | Status: DC
  Administered 2016-05-08 – 2016-05-30 (×19): 1 via ORAL
  Filled 2016-05-08 (×21): qty 1

## 2016-05-08 MED ORDER — SODIUM CHLORIDE 0.9 % IV SOLN: INTRAVENOUS | Status: DC

## 2016-05-08 MED ORDER — FENTANYL CITRATE (PF) 250 MCG/5ML IJ SOLN
INTRAMUSCULAR | Status: AC
  Filled 2016-05-08: qty 5

## 2016-05-08 MED ORDER — FUROSEMIDE 80 MG PO TABS
80.0000 mg | ORAL_TABLET | Freq: Two times a day (BID) | ORAL | Status: DC
  Administered 2016-05-08 – 2016-05-09 (×3): 80 mg via ORAL
  Filled 2016-05-08 (×6): qty 1

## 2016-05-08 MED ORDER — PROPOFOL 10 MG/ML IV BOLUS
INTRAVENOUS | Status: AC
  Filled 2016-05-08: qty 20

## 2016-05-08 MED ORDER — VANCOMYCIN HCL IN DEXTROSE 1-5 GM/200ML-% IV SOLN
INTRAVENOUS | Status: AC
  Filled 2016-05-08: qty 200

## 2016-05-08 MED ORDER — MIDAZOLAM HCL 2 MG/2ML IJ SOLN
INTRAMUSCULAR | Status: AC
  Filled 2016-05-08: qty 2

## 2016-05-08 MED ORDER — ONDANSETRON HCL 4 MG/2ML IJ SOLN
INTRAMUSCULAR | Status: AC
  Filled 2016-05-08: qty 2

## 2016-05-08 NOTE — OR Nursing (Signed)
OR Nursing: Attempted to transport patient to OR.  Patient stated that she did not want to have surgery today due to being tired and pain in neck area from dialysis catheter.  Dr. Imogene Burnhen notified.  Case cancelled for today.  Will contact [facility] office.  Informed hemodialysis RN, will contact nephrologist to notify of surgical decision.

## 2016-05-08 NOTE — Progress Notes (Signed)
[name] Progress Note   Subjective:  TDC yesterday Did not get HD yesterday so first treatment is today and will plan again for tomorrow Dr. Myra GianottiBrabham has seen and plans LUE access Neck a little sore, o/w no complaints  Vitals:   05/08/16 0525 05/08/16 0703 05/08/16 0705 05/08/16 0710  BP: (!) 149/71 (!) 233/71 (!) 205/65 (!) 206/59  Pulse: 90 91 91 91  Resp: 16 20    Temp: 98.2 F (36.8 C) 98.2 F (36.8 C)    TempSrc:  Oral    SpO2: 96% 96%    Weight:  (!) 141 kg (310 lb 13.6 oz)    Height:        Exam: Gen morbidly obese, awake and alert Seen in HD No jvd or bruits Chest clear ant/lat, some scattered rales at the bases, no wheezing Rhythm regular,, normal heart sounds Abd soft ntnd no mass or ascites +bs Huge pannus Ext diffuse 2-3+ pitting edema of arms and lower legs Chronic skin changes/darkening of LE's Dressings on LE wounds Some bruising for R IJ TDC placed 3/27   Inpatient medications: . amLODipine  10 mg Oral Daily  . atorvastatin  20 mg Oral Daily  . calcium acetate  667 mg Oral TID WC  . carvedilol  25 mg Oral BID WC  . [START ON 05/09/2016] darbepoetin (ARANESP) injection - NON-DIALYSIS  150 mcg Subcutaneous Q Thu-1800  . doxycycline  100 mg Oral Q12H  . ferrous sulfate  325 mg Oral TID WC  . furosemide  160 mg Intravenous Q6H  . heparin  5,000 Units Subcutaneous Q8H  . hydrALAZINE  100 mg Oral TID  . insulin aspart  0-5 Units Subcutaneous QHS  . insulin aspart  0-9 Units Subcutaneous TID WC  . isosorbide mononitrate  30 mg Oral Daily  . metolazone  10 mg Oral BID  . multivitamin with minerals  1 tablet Oral Daily  . polyethylene glycol  17 g Oral Daily  . potassium chloride  40 mEq Oral Daily  . senna-docusate  1 tablet Oral BID  . vancomycin  1,000 mg Intravenous To OR     Recent Labs Lab 05/05/16 0543 05/06/16 0213 05/07/16 0737  NA 142  142 142 143  K 3.3*  3.3* 3.4* 3.7  CL 95*  95* 97* 98*  CO2 26  25 25 27    GLUCOSE 163*  170* 173* 169*  BUN 131*  132* 130* 129*  CREATININE 7.18*  7.22* 7.01* 6.66*  CALCIUM 8.9  8.8* 8.7* 8.8*  PHOS 7.6* 7.2* 6.6*    Recent Labs Lab 05/01/16 1218 05/02/16 0555  05/05/16 0543 05/06/16 0213 05/07/16 0737  AST 9* 10*  --   --   --   --   ALT 8* 7*  --   --   --   --   ALKPHOS 65 60  --   --   --   --   BILITOT 0.9 1.0  --   --   --   --   PROT 7.9 7.1  --   --   --   --   ALBUMIN 2.4* 2.2*  < > 2.0* 2.1* 2.1*  < > = values in this interval not displayed.  Recent Labs Lab 05/01/16 1218  05/03/16 0533 05/05/16 0543 05/07/16 0737  WBC 14.0*  < > 12.2* 11.9* 10.7*  NEUTROABS 11.0*  --   --   --   --   HGB 8.3*  < >  8.2* 8.6* 9.5*  HCT 25.2*  < > 24.6*  25.4* 26.7* 28.8*  MCV 83.7  < > 84.2 85.9 87.5  PLT 428*  < > 455* 511* 456*  < > = values in this interval not displayed.   Iron/TIBC/Ferritin/ %Sat    Component Value Date/Time   IRON 24 (L) 05/07/2016 0737   TIBC NOT CALCULATED 05/07/2016 0737   FERRITIN 1,069 (H) 05/07/2016 0737   IRONPCTSAT NOT CALCULATED 05/07/2016 0737    Assessment: 1. 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  Creat increasing from 4-6 from Oct '17 - Feb '18.  Now creat is in 7-8 range.  Admitted with sig fluid overload, anasarca/pulm edema on CXR. Diuresed somewhat but still sig volume overload.  2. Per discussion w/ husband pt had been bedridden for some time but she said because of broken wheelchair and after working with PT, demonstrating ability to stand, and wishing to proceed - dialysis initiated 3. TDC (IR 3/27).  4. [facility] has seen. Probable L AVG. 5. HD #1 today (got postponed from yesterday for unclear reasons), #2 tomorrow 6. Still making urine, leave lasix on, change to po, stop metolazone 7. CLIP  2. Anemia - of CKD +/- other;  1. Transfused 2 U on 3/22.  2. Added darbe 150/week.  3. TSat low - Fe load w/HD 3. Secondary HPT  1. PTH 03/2016 only 99 - no  calcitriol needed 2. Ca acetate 1 c/meals for phos 3. Renal diet. 4. Morbid obesity 5. Debility - as above 6. Hist CVA 7. DM - per primary service 8. Inner thigh wounds - local care,   Camille Bal, MD [name] [phone] Pager 05/08/2016, 7:21 AM

## 2016-05-08 NOTE — Progress Notes (Signed)
PT Cancellation Note  Patient Details Name: [name] MRN: [id number] DOB: 09/02/1975   Cancelled Treatment:    Reason Eval/Treat Not Completed: Patient at procedure or test/unavailable Pt currently in HD. Will reattempt as schedule allows this afternoon. If unable to treat pt this afternoon, will reattempt as schedule allows.   Margot ChimesBrittany Smith, PT, DPT  Acute Rehabilitation Services  Pager: [phone]   Melvyn NovasBrittany L Smith 05/08/2016, 8:35 AM

## 2016-05-08 NOTE — Progress Notes (Signed)
Dialysis nurse called to say they would not be dialyzing [name] this evening as planned and   contacted the Nephrologist to notify. Pt was moved to the am Wed moring and will be dialized in the am.

## 2016-05-08 NOTE — Procedures (Signed)
I have personally attended this patient's dialysis session.   HD # 1 for new ESRD - 2.5 hours TDC R IJ (3/27) 4K bath, UF goal 2-3 liters Pre  SBP 200  Linda Balynthia Trinidad Ingle, MD [facility] [phone] Pager 05/08/2016, 7:33 AM

## 2016-05-08 NOTE — Progress Notes (Signed)
[facility]                                                                              Patient Demographics  [name], is a 41 y.o. female, DOB - Jan 11, 1976, ZOX:[id number]  Admit date - 05/01/2016   Admitting Physician Costin Otelia Sergeant, MD  Outpatient Primary MD for the patient is Marletta Lor, NP  Outpatient specialists:   LOS - 7  days    Chief Complaint  Patient presents with  . wounds       Brief summary  41 y.o.femalewith medical history significant for morbid obesity, chronic kidney disease stage IV-V, diabetes mellitus, hypertension, prior CVA, diastolic CHF who presented to ED with progressive weakness, fever for past 1 week prior to this admission. She is actually bed bound for past year or so per her husband report ever since stroke. She also had dry cough and shortness of breath for past 1 week. She was hemodynamically stable on admission. Her creatinine was 7.9 (much worse since her recent Cr values of 4-5). Renal has seen her in consultation. Due to her debility she is not a good candidate for HD. Palliative care was consulted as well.      Assessment & Plan   Acute metabolic encephalopathy - Improving, at baseline mental status per family - Likely multifactorial from history of CVA, end stage renal disease, possible uremia, acute infectious process - possible pneumonia  Chest pain episode  -  Troponin 1 was 0.04 however d-dimer was 2.01 - VQ scan Negative for any PE no further chest pain episodes  Acute renal failure superimposed on CKD stage 5 not on HD / Acute pulmonary edema  - Due to progressive disease from uncontrolled diabetes and FSG. Patient was placed on aggressive IV Lasix and metolazone per renal recommendations. - Lasix transitioned to oral.  - Tunneled dialysis cath Placed on 3/27 - Hemodialysis initiated today - Vascular surgery consulted, patient refused with graft placement - CLIP process to be initiated  by renal  Acute respiratory failure with hypoxia / Right middle lobe pneumonia / Leukocytosis  - CT chest without contrast showed linear density over the right middle lobe likely atelectasis, although cannot completely exclude infection - She is on empiric doxycycline  - Blood cultures 1/2 showed colitis negative staph likely contaminant    Anemia of chronic disease / IDA - Due to CKD - Status post 2 units PRBC transfusion on 05/02/2016  - Aranesp 3/22, continue iron supplementation, B12  Uncontrolled diabetes mellitus with diabetic nephropathy with long term insulin use - Continue current insulin regimen: SSI  Dyslipidemia associated with type 2 DM - Continue statin therapy   Essential hypertension - Continue Norvasc, coreg, hydralazine, Imdur   Bilateral inner thigh wounds - Related to moisture and friction from thighs rubbing together - Left inner thigh has large area with many open areas scattered throughout, 17cm x 12cm x 0.1cm  - Right inner thigh wound is 8cm x 5cm x 0.3cm  - Dressing procedure/placement/frequency: Cleanse with NS, gently pat dry, apply Xeroform gauze, top with dry gauze,change every shift. Pt is already on a low air  loss mattress  Morbid obesity / Debility  - Nutrition consulted - Body mass index is 54.58 kg/m.  Code Status: full  DVT Prophylaxis:  heparin  Family Communication: Discussed in detail with the patient, all imaging results, lab results explained to the patient. Discussed with patient's husband yesterday   Disposition Plan: CIR consult placed  Time Spent in minutes 25 minutes  Procedures:  Hemodialysis is initiated 3/28  Consultants:    Nephrology  WOC  Palliative care   Nutrition  Antimicrobials:  Doxycycline 3/21->   Medications  Scheduled Meds: . amLODipine  10 mg Oral Daily  . atorvastatin  20 mg Oral Daily  . calcium acetate  667 mg Oral TID WC  . carvedilol  25 mg Oral BID WC  . [START ON 05/09/2016]  darbepoetin (ARANESP) injection - NON-DIALYSIS  150 mcg Subcutaneous Q Thu-1800  . doxycycline  100 mg Oral Q12H  . ferrous sulfate  325 mg Oral TID WC  . furosemide  80 mg Oral BID  . heparin  5,000 Units Subcutaneous Q8H  . hydrALAZINE  100 mg Oral TID  . insulin aspart  0-5 Units Subcutaneous QHS  . insulin aspart  0-9 Units Subcutaneous TID WC  . isosorbide mononitrate  30 mg Oral Daily  . multivitamin  1 tablet Oral QHS  . polyethylene glycol  17 g Oral Daily  . potassium chloride  40 mEq Oral Daily  . senna-docusate  1 tablet Oral BID   Continuous Infusions: . sodium chloride     PRN Meds:.acetaminophen, albuterol, camphor-menthol, guaiFENesin-dextromethorphan, HYDROcodone-acetaminophen, morphine injection, nitroGLYCERIN, ondansetron **OR** ondansetron (ZOFRAN) IV   Antibiotics   Anti-infectives    Start     Dose/Rate Route Frequency Ordered Stop   05/08/16 1000  vancomycin (VANCOCIN) IVPB 1000 mg/200 mL premix     1,000 mg 200 mL/hr over 60 Minutes Intravenous To Surgery 05/07/16 1904 05/08/16 1004   05/08/16 0902  vancomycin (VANCOCIN) 1-5 GM/200ML-% IVPB    Comments:  Matilde Haymaker, [name]   : cabinet override      05/08/16 0902 05/08/16 0904   05/07/16 1300  ceFAZolin (ANCEF) 3 g in dextrose 5 % 50 mL IVPB     3 g 130 mL/hr over 30 Minutes Intravenous To Radiology 05/07/16 1232 05/07/16 1330   05/02/16 2200  doxycycline (VIBRA-TABS) tablet 100 mg     100 mg Oral Every 12 hours 05/02/16 1732     05/01/16 2200  piperacillin-tazobactam (ZOSYN) IVPB 2.25 g  Status:  Discontinued     2.25 g 100 mL/hr over 30 Minutes Intravenous Every 8 hours 05/01/16 1324 05/02/16 1729   05/01/16 1330  vancomycin (VANCOCIN) 2,500 mg in sodium chloride 0.9 % 500 mL IVPB     2,500 mg 250 mL/hr over 120 Minutes Intravenous  Once 05/01/16 1309 05/01/16 1551   05/01/16 1300  piperacillin-tazobactam (ZOSYN) IVPB 3.375 g     3.375 g 100 mL/hr over 30 Minutes Intravenous  Once 05/01/16 1258  05/01/16 1340   05/01/16 1300  vancomycin (VANCOCIN) IVPB 1000 mg/200 mL premix  Status:  Discontinued     1,000 mg 200 mL/hr over 60 Minutes Intravenous  Once 05/01/16 1258 05/01/16 1308        Subjective:   [name] was seen and examined today. No complaints, tolerating hemodialysis, initiated today. Patient denies dizziness, shortness of breath, abdominal pain, N/V/D/C, new weakness, numbess, tingling.   Objective:   Vitals:   05/08/16 0900 05/08/16 0929 05/08/16 0935 05/08/16 1046  BP: Marland Kitchen)  155/82 (!) 150/83 (!) 155/84 (!) 148/84  Pulse: 90 89 90 93  Resp: (!) 22 (!) 22 (!) 21   Temp:   98 F (36.7 C) 98.2 F (36.8 C)  TempSrc:   Oral Oral  SpO2: 100% 100% 100% 99%  Weight:   (!) 138.5 kg (305 lb 5.4 oz)   Height:        Intake/Output Summary (Last 24 hours) at 05/08/16 1218 Last data filed at 05/08/16 0935  Gross per 24 hour  Intake                0 ml  Output             4200 ml  Net            -4200 ml     Wt Readings from Last 3 Encounters:  05/08/16 (!) 138.5 kg (305 lb 5.4 oz)  03/29/16 (!) 167 kg (368 lb 3.2 oz)  03/09/16 (!) 168.3 kg (371 lb)     Exam  General: Alert and oriented 3 NAD  HEENT:  Neck: Supple, no JVD  Cardiovascular: S1 S2 clear, RRR  Respiratory: Clear to auscultation bilaterally, no wheezing, rales or rhonchi  Gastrointestinal: Morbidly obese, Soft, nontender, nondistended, + bowel sounds  Ext: no cyanosis clubbing or edema, dressing in place on the thigh  Neuro: no new deficits  Skin: No rashes  Psych: Normal affect and demeanor, alert and oriented x3    Data Reviewed:  I have personally reviewed following labs and imaging studies  Micro Results Recent Results (from the past 240 hour(s))  Urine culture     Status: None   Collection Time: 05/01/16 11:44 AM  Result Value Ref Range Status   Specimen Description URINE, RANDOM  Final   Special Requests NONE  Final   Culture   Final    NO  GROWTH Performed at [name], [address] [place], [place] [address]    Report Status 05/02/2016 FINAL  Final  Blood Culture (routine x 2)     Status: Abnormal   Collection Time: 05/01/16 12:20 PM  Result Value Ref Range Status   Specimen Description BLOOD LEFT ANTECUBITAL  Final   Special Requests BOTTLES DRAWN AEROBIC AND ANAEROBIC 5CC  Final   Culture  Setup Time   Final    GRAM POSITIVE COCCI IN CLUSTERS AEROBIC BOTTLE ONLY CRITICAL RESULT CALLED TO, READ BACK BY AND VERIFIED WITH: T GREEN PHARMD [id number] 05/02/16 A BROWNING    Culture (A)  Final    STAPHYLOCOCCUS SPECIES (COAGULASE NEGATIVE) THE SIGNIFICANCE OF ISOLATING THIS ORGANISM FROM A SINGLE SET OF BLOOD CULTURES WHEN MULTIPLE SETS ARE DRAWN IS UNCERTAIN. PLEASE NOTIFY THE MICROBIOLOGY DEPARTMENT WITHIN ONE WEEK IF SPECIATION AND SENSITIVITIES ARE REQUIRED. Performed at [facility], [address] [place], [place] [address]    Report Status 05/04/2016 FINAL  Final  Blood Culture ID Panel (Reflexed)     Status: None   Collection Time: 05/01/16 12:20 PM  Result Value Ref Range Status   Enterococcus species NOT DETECTED NOT DETECTED Final   Listeria monocytogenes NOT DETECTED NOT DETECTED Final   Staphylococcus species NOT DETECTED NOT DETECTED Final   Staphylococcus aureus NOT DETECTED NOT DETECTED Final   Streptococcus species NOT DETECTED NOT DETECTED Final   Streptococcus agalactiae NOT DETECTED NOT DETECTED Final   Streptococcus pneumoniae NOT DETECTED NOT DETECTED Final   Streptococcus pyogenes NOT DETECTED NOT DETECTED Final   Acinetobacter baumannii NOT  DETECTED NOT DETECTED Final   Enterobacteriaceae species NOT DETECTED NOT DETECTED Final   Enterobacter cloacae complex NOT DETECTED NOT DETECTED Final   Escherichia coli NOT DETECTED NOT DETECTED Final   Klebsiella oxytoca NOT DETECTED NOT DETECTED Final   Klebsiella pneumoniae NOT DETECTED NOT DETECTED Final   Proteus species NOT DETECTED NOT  DETECTED Final   Serratia marcescens NOT DETECTED NOT DETECTED Final   Haemophilus influenzae NOT DETECTED NOT DETECTED Final   Neisseria meningitidis NOT DETECTED NOT DETECTED Final   Pseudomonas aeruginosa NOT DETECTED NOT DETECTED Final   Candida albicans NOT DETECTED NOT DETECTED Final   Candida glabrata NOT DETECTED NOT DETECTED Final   Candida krusei NOT DETECTED NOT DETECTED Final   Candida parapsilosis NOT DETECTED NOT DETECTED Final   Candida tropicalis NOT DETECTED NOT DETECTED Final    Comment: Performed at [facility], [address] [place], [place] [address]  Blood Culture (routine x 2)     Status: None   Collection Time: 05/01/16 12:26 PM  Result Value Ref Range Status   Specimen Description BLOOD LEFT HAND  Final   Special Requests BOTTLES DRAWN AEROBIC AND ANAEROBIC 5CC  Final   Culture   Final    NO GROWTH 5 DAYS Performed at [facility], [address] [place], [place] [address]    Report Status 05/06/2016 FINAL  Final    Radiology Reports Dg Chest 2 View  Result Date: 05/01/2016 CLINICAL DATA:  Fever and weakness. EXAM: CHEST  2 VIEW COMPARISON:  03/22/2016. FINDINGS: Cardiomegaly with mild bilateral interstitial prominence. No pleural effusion or pneumothorax. No acute bony abnormality. IMPRESSION: Cardiomegaly with mild bilateral interstitial prominence consistent with CHF. Findings have improved from prior study of 03/22/2016. Given patient's history of fever pneumonitis cannot be excluded. Electronically Signed   By: Maisie Fus  Register   On: 05/01/2016 13:30   Ct Chest Wo Contrast  Result Date: 05/02/2016 CLINICAL DATA:  Morbid obesity week chronic kidney disease and diabetes. Complains of weakness and fever over the past week. Cough. EXAM: CT CHEST WITHOUT CONTRAST TECHNIQUE: Multidetector CT imaging of the chest was performed following the standard protocol without IV contrast. COMPARISON:  Chest x-ray 05/01/2016 FINDINGS: Cardiovascular: Mild to  moderate cardiomegaly. Remaining vascular structures are unremarkable. Mediastinum/Nodes: No definite mediastinal or hilar adenopathy. Remaining mediastinal structures are unremarkable. Lungs/Pleura: Lungs are somewhat hypoinflated and demonstrate mild bibasilar linear atelectasis. Linear density over the posterior and anterior right middle lobe likely atelectasis. No evidence of effusion. Airways are unremarkable. Upper Abdomen: Mild calcified plaque over the celiac axis, splenic artery and superior mesenteric arteries. Musculoskeletal: Minimal degenerative change of the spine. Prominent overlying soft tissues. IMPRESSION: Linear density over the right middle lobe likely atelectasis, although cannot completely exclude infection. Minimal linear bibasilar atelectasis. Moderate cardiomegaly. Electronically Signed   By: Elberta Fortis M.D.   On: 05/02/2016 15:53   Nm Pulmonary Perf And Vent  Result Date: 05/06/2016 CLINICAL DATA:  Chest pain EXAM: NUCLEAR MEDICINE VENTILATION - PERFUSION LUNG SCAN VIEWS: Anterior, posterior, RAO, LAO, RPO, LPO-ventilation and profusion. RADIOPHARMACEUTICALS:  32.6 mCi Technetium-36m DTPA aerosol inhalation and 4.2 mCi Technetium-60m MAA IV COMPARISON:  Chest radiograph May 06, 2016 FINDINGS: Ventilation: Radiotracer uptake is homogeneous and symmetric. No ventilation defects are evident. Perfusion: Radiotracer uptake is homogeneous and symmetric bilaterally. No perfusion defects evident. Cardiomegaly noted. IMPRESSION: No appreciable ventilation or perfusion defects. This study constitutes a very low probability of pulmonary embolus. Cardiomegaly noted. Electronically Signed   By: Chrissie Noa  Margarita GrizzleWoodruff III M.D.   On: 05/06/2016 14:19   Ir Fluoro Guide Cv Line Right  Result Date: 05/07/2016 INDICATION: End-stage renal disease and starting hemodialysis. EXAM: FLUOROSCOPIC AND ULTRASOUND GUIDED PLACEMENT OF A TUNNELED DIALYSIS CATHETER Physician: Rachelle HoraAdam R. Lowella DandyHenn, MD MEDICATIONS: Ancef 3  g; The antibiotic was administered within an appropriate time interval prior to skin puncture. ANESTHESIA/SEDATION: Versed 2.0 mg IV; Fentanyl 100 mcg IV; Moderate Sedation Time:  20 minutes The patient was continuously monitored during the procedure by the interventional radiology nurse under my direct supervision. FLUOROSCOPY TIME:  Fluoroscopy Time: 24 seconds, 2.5 mGy COMPLICATIONS: None immediate. PROCEDURE: Informed consent was obtained for placement of a tunneled dialysis catheter. The patient was placed supine on the interventional table. Ultrasound confirmed a patent right internal jugular vein. Ultrasound images were obtained for documentation. The right side of the neck was prepped and draped in a sterile fashion. The right side of the neck was anesthetized with 1% lidocaine. Maximal barrier sterile technique was utilized including caps, mask, sterile gowns, sterile gloves, sterile drape, hand hygiene and skin antiseptic. A small incision was made with #11 blade scalpel. A 21 gauge needle directed into the right internal jugular vein with ultrasound guidance. A micropuncture dilator set was placed. A 23 cm tip to cuff Palindrome catheter was selected. The skin below the right clavicle was anesthetized and a small incision was made with an #11 blade scalpel. A subcutaneous tunnel was formed to the vein dermatotomy site. The catheter was brought through the tunnel. The vein dermatotomy site was dilated to accommodate a peel-away sheath. The catheter was placed through the peel-away sheath and directed into the central venous structures. The tip of the catheter was placed at the superior cavoatrial junction with fluoroscopy. Fluoroscopic images were obtained for documentation. Both lumens were found to aspirate and flush well. The proper amount of heparin was flushed in both lumens. The vein dermatotomy site was closed using a single layer of absorbable suture and Dermabond. Gel-Foam placed in the  subcutaneous tract. The catheter was secured to the skin using Prolene suture. IMPRESSION: Successful placement of a right jugular tunneled dialysis catheter using ultrasound and fluoroscopic guidance. Electronically Signed   By: Richarda OverlieAdam  Henn M.D.   On: 05/07/2016 14:33   Ir Koreas Guide Vasc Access Right  Result Date: 05/07/2016 INDICATION: End-stage renal disease and starting hemodialysis. EXAM: FLUOROSCOPIC AND ULTRASOUND GUIDED PLACEMENT OF A TUNNELED DIALYSIS CATHETER Physician: Rachelle HoraAdam R. Lowella DandyHenn, MD MEDICATIONS: Ancef 3 g; The antibiotic was administered within an appropriate time interval prior to skin puncture. ANESTHESIA/SEDATION: Versed 2.0 mg IV; Fentanyl 100 mcg IV; Moderate Sedation Time:  20 minutes The patient was continuously monitored during the procedure by the interventional radiology nurse under my direct supervision. FLUOROSCOPY TIME:  Fluoroscopy Time: 24 seconds, 2.5 mGy COMPLICATIONS: None immediate. PROCEDURE: Informed consent was obtained for placement of a tunneled dialysis catheter. The patient was placed supine on the interventional table. Ultrasound confirmed a patent right internal jugular vein. Ultrasound images were obtained for documentation. The right side of the neck was prepped and draped in a sterile fashion. The right side of the neck was anesthetized with 1% lidocaine. Maximal barrier sterile technique was utilized including caps, mask, sterile gowns, sterile gloves, sterile drape, hand hygiene and skin antiseptic. A small incision was made with #11 blade scalpel. A 21 gauge needle directed into the right internal jugular vein with ultrasound guidance. A micropuncture dilator set was placed. A 23 cm tip to cuff Palindrome catheter was  selected. The skin below the right clavicle was anesthetized and a small incision was made with an #11 blade scalpel. A subcutaneous tunnel was formed to the vein dermatotomy site. The catheter was brought through the tunnel. The vein dermatotomy site  was dilated to accommodate a peel-away sheath. The catheter was placed through the peel-away sheath and directed into the central venous structures. The tip of the catheter was placed at the superior cavoatrial junction with fluoroscopy. Fluoroscopic images were obtained for documentation. Both lumens were found to aspirate and flush well. The proper amount of heparin was flushed in both lumens. The vein dermatotomy site was closed using a single layer of absorbable suture and Dermabond. Gel-Foam placed in the subcutaneous tract. The catheter was secured to the skin using Prolene suture. IMPRESSION: Successful placement of a right jugular tunneled dialysis catheter using ultrasound and fluoroscopic guidance. Electronically Signed   By: Richarda Overlie M.D.   On: 05/07/2016 14:33   Dg Chest Port 1 View  Result Date: 05/06/2016 CLINICAL DATA:  Acute onset of generalized chest pain. Initial encounter. EXAM: PORTABLE CHEST 1 VIEW COMPARISON:  Chest radiograph performed 05/01/2016, and CT of the chest performed 05/02/2016 FINDINGS: The lungs are hypoexpanded. Vascular congestion and vascular crowding are noted. Increased interstitial markings may reflect mild interstitial edema. There is no evidence of pleural effusion or pneumothorax. The cardiomediastinal silhouette is mildly enlarged. No acute osseous abnormalities are seen. IMPRESSION: Lungs hypoexpanded. Vascular congestion and mild cardiomegaly. Increased interstitial markings could reflect mild interstitial edema. Electronically Signed   By: Roanna Raider M.D.   On: 05/06/2016 02:48    Lab Data:  CBC:  Recent Labs Lab 05/02/16 0830 05/02/16 1830 05/03/16 0533 05/05/16 0543 05/07/16 0737 05/08/16 0720  WBC 13.4*  --  12.2* 11.9* 10.7* 11.3*  HGB 6.8* 7.8* 8.2* 8.6* 9.5* 8.7*  HCT 20.8* 23.6* 24.6*  25.4* 26.7* 28.8* 27.2*  MCV 82.9  --  84.2 85.9 87.5 87.2  PLT 475*  --  455* 511* 456* 423*   Basic Metabolic Panel:  Recent Labs Lab  05/03/16 1520 05/05/16 0543 05/06/16 0213 05/07/16 0737 05/08/16 0720  NA 138 142  142 142 143 142  K 3.2* 3.3*  3.3* 3.4* 3.7 3.7  CL 100* 95*  95* 97* 98* 98*  CO2 24 26  25 25 27 29   GLUCOSE 140* 163*  170* 173* 169* 169*  BUN 117* 131*  132* 130* 129* 131*  CREATININE 7.65* 7.18*  7.22* 7.01* 6.66* 6.59*  CALCIUM 8.2* 8.9  8.8* 8.7* 8.8* 8.7*  PHOS 8.8* 7.6* 7.2* 6.6*  --    GFR: Estimated Creatinine Clearance: 16.5 mL/min (A) (by C-G formula based on SCr of 6.59 mg/dL (H)). Liver Function Tests:  Recent Labs Lab 05/02/16 0555 05/03/16 1520 05/05/16 0543 05/06/16 0213 05/07/16 0737  AST 10*  --   --   --   --   ALT 7*  --   --   --   --   ALKPHOS 60  --   --   --   --   BILITOT 1.0  --   --   --   --   PROT 7.1  --   --   --   --   ALBUMIN 2.2* 2.1* 2.0* 2.1* 2.1*   No results for input(s): LIPASE, AMYLASE in the last 168 hours. No results for input(s): AMMONIA in the last 168 hours. Coagulation Profile:  Recent Labs Lab 05/07/16 0737 05/08/16 0720  INR 1.70  1.90   Cardiac Enzymes:  Recent Labs Lab 05/06/16 0213  TROPONINI 0.04*   BNP (last 3 results) No results for input(s): PROBNP in the last 8760 hours. HbA1C: No results for input(s): HGBA1C in the last 72 hours. CBG:  Recent Labs Lab 05/07/16 0743 05/07/16 1153 05/07/16 1744 05/07/16 2202 05/08/16 1043  GLUCAP 166* 147* 178* 180* 173*   Lipid Profile: No results for input(s): CHOL, HDL, LDLCALC, TRIG, CHOLHDL, LDLDIRECT in the last 72 hours. Thyroid Function Tests: No results for input(s): TSH, T4TOTAL, FREET4, T3FREE, THYROIDAB in the last 72 hours. Anemia Panel:  Recent Labs  05/07/16 0737  FERRITIN 1,069*  TIBC NOT CALCULATED  IRON 24*   Urine analysis:    Component Value Date/Time   COLORURINE STRAW (A) 05/01/2016 1144   APPEARANCEUR CLEAR 05/01/2016 1144   LABSPEC 1.009 05/01/2016 1144   PHURINE 5.0 05/01/2016 1144   GLUCOSEU NEGATIVE 05/01/2016 1144   HGBUR  SMALL (A) 05/01/2016 1144   BILIRUBINUR NEGATIVE 05/01/2016 1144   KETONESUR NEGATIVE 05/01/2016 1144   PROTEINUR 30 (A) 05/01/2016 1144   UROBILINOGEN 0.2 10/26/2013 1721   NITRITE NEGATIVE 05/01/2016 1144   LEUKOCYTESUR NEGATIVE 05/01/2016 1144     Nyimah Shadduck M.D. [facility] 05/08/2016, 12:18 PM  Pager: [phone] Between 7am to 7pm - call Pager - [phone]  After 7pm go to [email or web address] - password [email or web address]  Call night coverage person covering after 7pm

## 2016-05-08 NOTE — Progress Notes (Signed)
   Daily Progress Note   Per holding, pt refused to proceed with procedure.   Leonides SakeBrian Chen, MD, FACS [facility] Office: [phone] Pager: [phone]  05/08/2016, 10:16 AM

## 2016-05-08 NOTE — Progress Notes (Signed)
qPhysical Therapy Treatment Patient Details Name: Linda DykesDavenia McKinnon [name] MRN: [id number] DOB: 09/09/1975 Today's Date: 05/08/2016    History of Present Illness 41 y.o. female with medical history significant of morbid obesity, chronic kidney disease stage IV-V, diabetes mellitus, hypertension, prior CVA, diastolic CHF, presents to the emergency room with chief complaint of weakness as well as fever and diagnosed with acute hypoxic resp failure with acute encephalopathy    PT Comments    Pt progressing towards goals. Limited by nausea and fatigue this session, however, despite not feeling well agreeable to participate in therapy. Will attempt to progress mobility during next session. Continue to feel like pt will benefit from CIR to increase safety with mobility and basic transfers and for family education to assist with mobility at home. Will continue to follow to maximize functional mobility independence.  Follow Up Recommendations  CIR     Equipment Recommendations  Hospital bed;Other (comment);Wheelchair (measurements PT);Wheelchair cushion (measurements PT) (mechanical lift, Bari WC, Bari RW)    Recommendations for Other Services Rehab consult;OT consult     Precautions / Restrictions Precautions Precautions: Fall Precaution Comments: bil inner thigh wounds Restrictions Weight Bearing Restrictions: No    Mobility  Bed Mobility Overal bed mobility: Needs Assistance Bed Mobility: Rolling;Supine to Sit;Sit to Supine Rolling: Max assist   Supine to sit: Max assist;+2 for physical assistance Sit to supine: Max assist;+2 for physical assistance   General bed mobility comments: Pt attempted to assist with getting to EOB using BUE and helping with BLE. Requiring max A +2 for bed mobility and and scooting towards EOB.   Transfers                    Ambulation/Gait                 Stairs            Wheelchair Mobility    Modified Rankin (Stroke  Patients Only)       Balance Overall balance assessment: Needs assistance Sitting-balance support: Bilateral upper extremity supported;Feet supported Sitting balance-Leahy Scale: Poor Sitting balance - Comments: Pt requiring from mod A to min guard A for sitting balance this session. Pt progressed to occasional min guard for sitting balance with use of BUE. Pt reporting nauseousness in sitting. Able to wash face with washcloth during session.                                     Cognition Arousal/Alertness: Awake/alert Behavior During Therapy: WFL for tasks assessed/performed Overall Cognitive Status: Within Functional Limits for tasks assessed                                        Exercises      General Comments General comments (skin integrity, edema, etc.): Pt reporting nauseousness and fatigue during session which limited participation this session.       Pertinent Vitals/Pain Pain Assessment: 0-10 Pain Score: 10-Worst pain ever Pain Location: Bilateral thigh wounds Pain Descriptors / Indicators: Sore;Grimacing Pain Intervention(s): Limited activity within patient's tolerance;Monitored during session;Repositioned;Patient requesting pain meds-RN notified    Home Living                      Prior Function            PT  Goals (current goals can now be found in the care plan section) Acute Rehab PT Goals Patient Stated Goal: Wants to be able to move well enough to qualify for HD PT Goal Formulation: With patient Time For Goal Achievement: 05/22/16 Potential to Achieve Goals: Fair Progress towards PT goals: Progressing toward goals    Frequency    Min 3X/week      PT Plan Current plan remains appropriate    Co-evaluation             End of Session   Activity Tolerance: Patient limited by fatigue;Treatment limited secondary to medical complications (Comment) (feeling nauseous this session ) Patient left: in  bed;with call bell/phone within reach;with family/visitor present Nurse Communication: Mobility status PT Visit Diagnosis: Muscle weakness (generalized) (M62.81);Other abnormalities of gait and mobility (R26.89)     Time: 1610-9604 PT Time Calculation (min) (ACUTE ONLY): 26 min  Charges:  $Therapeutic Activity: 23-37 mins                    G Codes:       [name], PT, DPT  Acute Rehabilitation Services  Pager: [phone]  [name] 05/08/2016, 4:52 PM

## 2016-05-08 NOTE — Progress Notes (Signed)
OR called for pt to come to OR for graft procedure. Nurse notifed OR that patient is in dialysis with the chart. No orders for OR procedure in EPIC and notified OR. OR Rhonda at the schedule desk said they will get in touch with the ordering MD and handle everything down in the OR. Pt has been NPO since Midnight. Pt will leave from dialysis. Report called to OR.

## 2016-05-09 LAB — CBC
HEMATOCRIT: 27.4 % — AB (ref 36.0–46.0)
HEMOGLOBIN: 8.4 g/dL — AB (ref 12.0–15.0)
MCH: 27.2 pg (ref 26.0–34.0)
MCHC: 30.7 g/dL (ref 30.0–36.0)
MCV: 88.7 fL (ref 78.0–100.0)
Platelets: 339 10*3/uL (ref 150–400)
RBC: 3.09 MIL/uL — ABNORMAL LOW (ref 3.87–5.11)
RDW: 14.7 % (ref 11.5–15.5)
WBC: 12.2 10*3/uL — AB (ref 4.0–10.5)

## 2016-05-09 LAB — HEPATITIS B SURFACE ANTIBODY,QUALITATIVE: Hep B S Ab: NONREACTIVE

## 2016-05-09 LAB — RENAL FUNCTION PANEL
ALBUMIN: 2 g/dL — AB (ref 3.5–5.0)
ANION GAP: 14 (ref 5–15)
BUN: 94 mg/dL — ABNORMAL HIGH (ref 6–20)
CHLORIDE: 100 mmol/L — AB (ref 101–111)
CO2: 28 mmol/L (ref 22–32)
Calcium: 8.8 mg/dL — ABNORMAL LOW (ref 8.9–10.3)
Creatinine, Ser: 5.64 mg/dL — ABNORMAL HIGH (ref 0.44–1.00)
GFR, EST AFRICAN AMERICAN: 10 mL/min — AB (ref >60)
GFR, EST NON AFRICAN AMERICAN: 9 mL/min — AB (ref >60)
Glucose, Bld: 155 mg/dL — ABNORMAL HIGH (ref 65–99)
PHOSPHORUS: 5.1 mg/dL — AB (ref 2.5–4.6)
POTASSIUM: 3.9 mmol/L (ref 3.5–5.1)
Sodium: 142 mmol/L (ref 135–145)

## 2016-05-09 LAB — HEPATITIS B CORE ANTIBODY, IGM: HEP B C IGM: NEGATIVE

## 2016-05-09 LAB — GLUCOSE, CAPILLARY
GLUCOSE-CAPILLARY: 151 mg/dL — AB (ref 65–99)
GLUCOSE-CAPILLARY: 146 mg/dL — AB (ref 65–99)
GLUCOSE-CAPILLARY: 154 mg/dL — AB (ref 65–99)
Glucose-Capillary: 159 mg/dL — ABNORMAL HIGH (ref 65–99)
Glucose-Capillary: 156 mg/dL — ABNORMAL HIGH (ref 65–99)

## 2016-05-09 MED ORDER — ACETAMINOPHEN 325 MG PO TABS
ORAL_TABLET | ORAL | Status: AC
  Administered 2016-05-09: 650 mg via ORAL
  Filled 2016-05-09: qty 2

## 2016-05-09 MED ORDER — DARBEPOETIN ALFA 150 MCG/0.3ML IJ SOSY
PREFILLED_SYRINGE | INTRAMUSCULAR | Status: AC
  Administered 2016-05-09: 150 ug via SUBCUTANEOUS
  Filled 2016-05-09: qty 0.3

## 2016-05-09 MED ORDER — SODIUM CHLORIDE 0.9 % IV SOLN
125.0000 mg | INTRAVENOUS | Status: DC
  Administered 2016-05-09 – 2016-05-11 (×2): 125 mg via INTRAVENOUS
  Filled 2016-05-09 (×4): qty 10

## 2016-05-09 MED ORDER — NEPRO/CARBSTEADY PO LIQD
237.0000 mL | Freq: Three times a day (TID) | ORAL | Status: DC
  Administered 2016-05-10 – 2016-05-30 (×30): 237 mL via ORAL

## 2016-05-09 NOTE — Progress Notes (Signed)
Dialysis treatment terminated with 37 minutes remaining due to clotted dialyzer.  Nephrology aware.  1900 mL ultrafiltrated.  1300 mL net fluid removal.  Patient Oriented to self, place. Lung sounds diminished to ausculation in all fields. Generalized edema. Cardiac: NSR.  Cleansed RIJ catheter with chlorhexidine.  Disconnected lines and flushed ports with saline per protocol.  Ports locked with heparin and capped per protocol.    Report given to bedside, RN Mathis FareAlbert.

## 2016-05-09 NOTE — Progress Notes (Signed)
Please contact us when patient is ready to have fistula surgery and we will add her to the OR schedule  Wells Kathalene Sporer

## 2016-05-09 NOTE — Progress Notes (Signed)
[facility]                                                                              Patient Demographics  [name], is a 41 y.o. female, DOB - 1975/03/23, WUJ:[id number]  Admit date - 05/01/2016   Admitting Physician Costin Otelia Sergeant, MD  Outpatient Primary MD for the patient is Marletta Lor, NP  Outpatient specialists:   LOS - 8  days    Chief Complaint  Patient presents with  . wounds       Brief summary  41 y.o.femalewith medical history significant for morbid obesity, chronic kidney disease stage IV-V, diabetes mellitus, hypertension, prior CVA, diastolic CHF who presented to ED with progressive weakness, fever for past 1 week prior to this admission. She is actually bed bound for past year or so per her husband report ever since stroke. She also had dry cough and shortness of breath for past 1 week. She was hemodynamically stable on admission. Her creatinine was 7.9 (much worse since her recent Cr values of 4-5). Renal has seen her in consultation. Due to her debility she is not a good candidate for HD. Palliative care was consulted as well.      Assessment & Plan   Acute metabolic encephalopathy - Improving, at baseline mental status per family - Likely multifactorial from history of CVA, end stage renal disease, possible uremia, acute infectious process - possible pneumonia  Chest pain episode  -  Troponin 1 was 0.04 however d-dimer was 2.01 - VQ scan Negative for any PE no further chest pain episodes  Acute renal failure superimposed on CKD stage 5 not on HD / Acute pulmonary edema  - Due to progressive disease from uncontrolled diabetes and FSG. Patient was placed on aggressive IV Lasix and metolazone per renal recommendations. - Lasix transitioned to oral.  - Tunneled dialysis cath Placed on 3/27 - Hemodialysis initiated 3/28 - Vascular surgery consulted, patient refused with graft placement - Accepted at [facility] [address] .Start Date and Time 1st treatment Wednesday 05/15/16 at 12:00 pm, tentative schedule MWF   Acute respiratory failure with hypoxia / Right middle lobe pneumonia / Leukocytosis  - CT chest without contrast showed linear density over the right middle lobe likely atelectasis, although cannot completely exclude infection - She is on empiric doxycycline  - Blood cultures 1/2 showed colitis negative staph likely contaminant    Anemia of chronic disease / IDA - Due to CKD - Status post 2 units PRBC transfusion on 05/02/2016  - Aranesp 3/22, continue iron supplementation, B12  Uncontrolled diabetes mellitus with diabetic nephropathy with long term insulin use - Continue current insulin regimen: SSI  Dyslipidemia associated with type 2 DM - Continue statin therapy   Essential hypertension - Continue Norvasc, coreg, hydralazine, Imdur   Bilateral inner thigh wounds - Related to moisture and friction from thighs rubbing together - Left inner thigh has large area with many open areas scattered throughout, 17cm x 12cm x 0.1cm  - Right inner thigh wound is 8cm x 5cm x 0.3cm  - Dressing procedure/placement/frequency: Cleanse with NS, gently pat dry, apply  Xeroform gauze, top with dry gauze,change every shift. Pt is already on a low air loss mattress  Morbid obesity / Debility  - Nutrition consulted - Body mass index is 54.58 kg/m.  Code Status: full  DVT Prophylaxis:  heparin  Family Communication: Discussed in detail with the patient, all imaging results, lab results explained to the patient.    Disposition Plan:   Time Spent in minutes 25 minutes  Procedures:  Hemodialysis is initiated 3/28  Consultants:    Nephrology  WOC  Palliative care   Nutrition  Antimicrobials:  Doxycycline 3/21->   Medications  Scheduled Meds: . amLODipine  10 mg Oral Daily  . atorvastatin  20 mg Oral Daily  . calcium acetate  667 mg Oral TID WC  . carvedilol  25 mg  Oral BID WC  . darbepoetin (ARANESP) injection - NON-DIALYSIS  150 mcg Subcutaneous Q Thu-1800  . doxycycline  100 mg Oral Q12H  . ferric gluconate (FERRLECIT/NULECIT) IV  125 mg Intravenous Q T,Th,Sa-HD  . furosemide  80 mg Oral BID  . heparin  5,000 Units Subcutaneous Q8H  . hydrALAZINE  100 mg Oral TID  . insulin aspart  0-5 Units Subcutaneous QHS  . insulin aspart  0-9 Units Subcutaneous TID WC  . isosorbide mononitrate  30 mg Oral Daily  . multivitamin  1 tablet Oral QHS  . polyethylene glycol  17 g Oral Daily  . potassium chloride  40 mEq Oral Daily  . senna-docusate  1 tablet Oral BID   Continuous Infusions: . sodium chloride     PRN Meds:.acetaminophen, albuterol, camphor-menthol, guaiFENesin-dextromethorphan, HYDROcodone-acetaminophen, morphine injection, nitroGLYCERIN, ondansetron **OR** ondansetron (ZOFRAN) IV   Antibiotics   Anti-infectives    Start     Dose/Rate Route Frequency Ordered Stop   05/08/16 1000  vancomycin (VANCOCIN) IVPB 1000 mg/200 mL premix     1,000 mg 200 mL/hr over 60 Minutes Intravenous To Surgery 05/07/16 1904 05/08/16 1004   05/08/16 0902  vancomycin (VANCOCIN) 1-5 GM/200ML-% IVPB    Comments:  Matilde Haymaker, [name]   : cabinet override      05/08/16 0902 05/08/16 0904   05/07/16 1300  ceFAZolin (ANCEF) 3 g in dextrose 5 % 50 mL IVPB     3 g 130 mL/hr over 30 Minutes Intravenous To Radiology 05/07/16 1232 05/07/16 1330   05/02/16 2200  doxycycline (VIBRA-TABS) tablet 100 mg     100 mg Oral Every 12 hours 05/02/16 1732     05/01/16 2200  piperacillin-tazobactam (ZOSYN) IVPB 2.25 g  Status:  Discontinued     2.25 g 100 mL/hr over 30 Minutes Intravenous Every 8 hours 05/01/16 1324 05/02/16 1729   05/01/16 1330  vancomycin (VANCOCIN) 2,500 mg in sodium chloride 0.9 % 500 mL IVPB     2,500 mg 250 mL/hr over 120 Minutes Intravenous  Once 05/01/16 1309 05/01/16 1551   05/01/16 1300  piperacillin-tazobactam (ZOSYN) IVPB 3.375 g     3.375 g 100 mL/hr  over 30 Minutes Intravenous  Once 05/01/16 1258 05/01/16 1340   05/01/16 1300  vancomycin (VANCOCIN) IVPB 1000 mg/200 mL premix  Status:  Discontinued     1,000 mg 200 mL/hr over 60 Minutes Intravenous  Once 05/01/16 1258 05/01/16 1308        Subjective:   [name] was seen and examined today. No complaints today, states she was feeling very nauseus and tired and refused the graft procedure.Patient denies dizziness, shortness of breath, abdominal pain, N/V/D/C, new weakness, numbess, tingling.  Objective:   Vitals:   05/09/16 1200 05/09/16 1230 05/09/16 1235 05/09/16 1307  BP: (!) 142/91 (!) 142/91 134/77 (!) 141/76  Pulse: 98 98 97 96  Resp:  (!) 23  20  Temp:  98.4 F (36.9 C)  98.7 F (37.1 C)  TempSrc:    Oral  SpO2:    97%  Weight:      Height:        Intake/Output Summary (Last 24 hours) at 05/09/16 1525 Last data filed at 05/09/16 1500  Gross per 24 hour  Intake                0 ml  Output             3200 ml  Net            -3200 ml     Wt Readings from Last 3 Encounters:  05/08/16 (!) 138.5 kg (305 lb 5.4 oz)  03/29/16 (!) 167 kg (368 lb 3.2 oz)  03/09/16 (!) 168.3 kg (371 lb)     Exam  General: Alert and oriented 3 NAD  HEENT:  Neck: Supple, no JVD  Cardiovascular: S1 S2 clear, RRR  Respiratory: CTAB  Gastrointestinal: Morbidly obese, soft, NT, ND  Ext: no cyanosis clubbing or edema, dressing in place on the thigh  Neuro: no new deficits  Skin: No rashes  Psych: Normal affect and demeanor, alert and oriented x3    Data Reviewed:  I have personally reviewed following labs and imaging studies  Micro Results Recent Results (from the past 240 hour(s))  Urine culture     Status: None   Collection Time: 05/01/16 11:44 AM  Result Value Ref Range Status   Specimen Description URINE, RANDOM  Final   Special Requests NONE  Final   Culture   Final    NO GROWTH Performed at [facility], [address]  [place], [place] [address]    Report Status 05/02/2016 FINAL  Final  Blood Culture (routine x 2)     Status: Abnormal   Collection Time: 05/01/16 12:20 PM  Result Value Ref Range Status   Specimen Description BLOOD LEFT ANTECUBITAL  Final   Special Requests BOTTLES DRAWN AEROBIC AND ANAEROBIC 5CC  Final   Culture  Setup Time   Final    GRAM POSITIVE COCCI IN CLUSTERS AEROBIC BOTTLE ONLY CRITICAL RESULT CALLED TO, READ BACK BY AND VERIFIED WITH: T GREEN PHARMD [id number] 05/02/16 A BROWNING    Culture (A)  Final    STAPHYLOCOCCUS SPECIES (COAGULASE NEGATIVE) THE SIGNIFICANCE OF ISOLATING THIS ORGANISM FROM A SINGLE SET OF BLOOD CULTURES WHEN MULTIPLE SETS ARE DRAWN IS UNCERTAIN. PLEASE NOTIFY THE MICROBIOLOGY DEPARTMENT WITHIN ONE WEEK IF SPECIATION AND SENSITIVITIES ARE REQUIRED. Performed at [facility], [address] [place], [place] [address]    Report Status 05/04/2016 FINAL  Final  Blood Culture ID Panel (Reflexed)     Status: None   Collection Time: 05/01/16 12:20 PM  Result Value Ref Range Status   Enterococcus species NOT DETECTED NOT DETECTED Final   Listeria monocytogenes NOT DETECTED NOT DETECTED Final   Staphylococcus species NOT DETECTED NOT DETECTED Final   Staphylococcus aureus NOT DETECTED NOT DETECTED Final   Streptococcus species NOT DETECTED NOT DETECTED Final   Streptococcus agalactiae NOT DETECTED NOT DETECTED Final   Streptococcus pneumoniae NOT DETECTED NOT DETECTED Final   Streptococcus pyogenes NOT DETECTED NOT DETECTED Final   Acinetobacter baumannii NOT DETECTED NOT DETECTED  Final   Enterobacteriaceae species NOT DETECTED NOT DETECTED Final   Enterobacter cloacae complex NOT DETECTED NOT DETECTED Final   Escherichia coli NOT DETECTED NOT DETECTED Final   Klebsiella oxytoca NOT DETECTED NOT DETECTED Final   Klebsiella pneumoniae NOT DETECTED NOT DETECTED Final   Proteus species NOT DETECTED NOT DETECTED Final   Serratia marcescens NOT DETECTED NOT DETECTED  Final   Haemophilus influenzae NOT DETECTED NOT DETECTED Final   Neisseria meningitidis NOT DETECTED NOT DETECTED Final   Pseudomonas aeruginosa NOT DETECTED NOT DETECTED Final   Candida albicans NOT DETECTED NOT DETECTED Final   Candida glabrata NOT DETECTED NOT DETECTED Final   Candida krusei NOT DETECTED NOT DETECTED Final   Candida parapsilosis NOT DETECTED NOT DETECTED Final   Candida tropicalis NOT DETECTED NOT DETECTED Final    Comment: Performed at [facility], [address] [place], [place] [address]  Blood Culture (routine x 2)     Status: None   Collection Time: 05/01/16 12:26 PM  Result Value Ref Range Status   Specimen Description BLOOD LEFT HAND  Final   Special Requests BOTTLES DRAWN AEROBIC AND ANAEROBIC 5CC  Final   Culture   Final    NO GROWTH 5 DAYS Performed at [facility], [address], [place], [place] [address]    Report Status 05/06/2016 FINAL  Final    Radiology Reports Dg Chest 2 View  Result Date: 05/01/2016 CLINICAL DATA:  Fever and weakness. EXAM: CHEST  2 VIEW COMPARISON:  03/22/2016. FINDINGS: Cardiomegaly with mild bilateral interstitial prominence. No pleural effusion or pneumothorax. No acute bony abnormality. IMPRESSION: Cardiomegaly with mild bilateral interstitial prominence consistent with CHF. Findings have improved from prior study of 03/22/2016. Given patient's history of fever pneumonitis cannot be excluded. Electronically Signed   By: Maisie Fus  Register   On: 05/01/2016 13:30   Ct Chest Wo Contrast  Result Date: 05/02/2016 CLINICAL DATA:  Morbid obesity week chronic kidney disease and diabetes. Complains of weakness and fever over the past week. Cough. EXAM: CT CHEST WITHOUT CONTRAST TECHNIQUE: Multidetector CT imaging of the chest was performed following the standard protocol without IV contrast. COMPARISON:  Chest x-ray 05/01/2016 FINDINGS: Cardiovascular: Mild to moderate cardiomegaly. Remaining vascular structures are  unremarkable. Mediastinum/Nodes: No definite mediastinal or hilar adenopathy. Remaining mediastinal structures are unremarkable. Lungs/Pleura: Lungs are somewhat hypoinflated and demonstrate mild bibasilar linear atelectasis. Linear density over the posterior and anterior right middle lobe likely atelectasis. No evidence of effusion. Airways are unremarkable. Upper Abdomen: Mild calcified plaque over the celiac axis, splenic artery and superior mesenteric arteries. Musculoskeletal: Minimal degenerative change of the spine. Prominent overlying soft tissues. IMPRESSION: Linear density over the right middle lobe likely atelectasis, although cannot completely exclude infection. Minimal linear bibasilar atelectasis. Moderate cardiomegaly. Electronically Signed   By: Elberta Fortis M.D.   On: 05/02/2016 15:53   Nm Pulmonary Perf And Vent  Result Date: 05/06/2016 CLINICAL DATA:  Chest pain EXAM: NUCLEAR MEDICINE VENTILATION - PERFUSION LUNG SCAN VIEWS: Anterior, posterior, RAO, LAO, RPO, LPO-ventilation and profusion. RADIOPHARMACEUTICALS:  32.6 mCi Technetium-67m DTPA aerosol inhalation and 4.2 mCi Technetium-24m MAA IV COMPARISON:  Chest radiograph May 06, 2016 FINDINGS: Ventilation: Radiotracer uptake is homogeneous and symmetric. No ventilation defects are evident. Perfusion: Radiotracer uptake is homogeneous and symmetric bilaterally. No perfusion defects evident. Cardiomegaly noted. IMPRESSION: No appreciable ventilation or perfusion defects. This study constitutes a very low probability of pulmonary embolus. Cardiomegaly noted. Electronically Signed   By: Bretta Bang III  M.D.   On: 05/06/2016 14:19   Ir Fluoro Guide Cv Line Right  Result Date: 05/07/2016 INDICATION: End-stage renal disease and starting hemodialysis. EXAM: FLUOROSCOPIC AND ULTRASOUND GUIDED PLACEMENT OF A TUNNELED DIALYSIS CATHETER Physician: Rachelle Hora. Lowella Dandy, MD MEDICATIONS: Ancef 3 g; The antibiotic was administered within an appropriate  time interval prior to skin puncture. ANESTHESIA/SEDATION: Versed 2.0 mg IV; Fentanyl 100 mcg IV; Moderate Sedation Time:  20 minutes The patient was continuously monitored during the procedure by the interventional radiology nurse under my direct supervision. FLUOROSCOPY TIME:  Fluoroscopy Time: 24 seconds, 2.5 mGy COMPLICATIONS: None immediate. PROCEDURE: Informed consent was obtained for placement of a tunneled dialysis catheter. The patient was placed supine on the interventional table. Ultrasound confirmed a patent right internal jugular vein. Ultrasound images were obtained for documentation. The right side of the neck was prepped and draped in a sterile fashion. The right side of the neck was anesthetized with 1% lidocaine. Maximal barrier sterile technique was utilized including caps, mask, sterile gowns, sterile gloves, sterile drape, hand hygiene and skin antiseptic. A small incision was made with #11 blade scalpel. A 21 gauge needle directed into the right internal jugular vein with ultrasound guidance. A micropuncture dilator set was placed. A 23 cm tip to cuff Palindrome catheter was selected. The skin below the right clavicle was anesthetized and a small incision was made with an #11 blade scalpel. A subcutaneous tunnel was formed to the vein dermatotomy site. The catheter was brought through the tunnel. The vein dermatotomy site was dilated to accommodate a peel-away sheath. The catheter was placed through the peel-away sheath and directed into the central venous structures. The tip of the catheter was placed at the superior cavoatrial junction with fluoroscopy. Fluoroscopic images were obtained for documentation. Both lumens were found to aspirate and flush well. The proper amount of heparin was flushed in both lumens. The vein dermatotomy site was closed using a single layer of absorbable suture and Dermabond. Gel-Foam placed in the subcutaneous tract. The catheter was secured to the skin using  Prolene suture. IMPRESSION: Successful placement of a right jugular tunneled dialysis catheter using ultrasound and fluoroscopic guidance. Electronically Signed   By: Richarda Overlie M.D.   On: 05/07/2016 14:33   Ir US Guide Vasc Access Right  Result Date: 05/07/2016 INDICATION: End-stage renal disease and starting hemodialysis. EXAM: FLUOROSCOPIC AND ULTRASOUND GUIDED PLACEMENT OF A TUNNELED DIALYSIS CATHETER Physician: Rachelle Hora. Lowella Dandy, MD MEDICATIONS: Ancef 3 g; The antibiotic was administered within an appropriate time interval prior to skin puncture. ANESTHESIA/SEDATION: Versed 2.0 mg IV; Fentanyl 100 mcg IV; Moderate Sedation Time:  20 minutes The patient was continuously monitored during the procedure by the interventional radiology nurse under my direct supervision. FLUOROSCOPY TIME:  Fluoroscopy Time: 24 seconds, 2.5 mGy COMPLICATIONS: None immediate. PROCEDURE: Informed consent was obtained for placement of a tunneled dialysis catheter. The patient was placed supine on the interventional table. Ultrasound confirmed a patent right internal jugular vein. Ultrasound images were obtained for documentation. The right side of the neck was prepped and draped in a sterile fashion. The right side of the neck was anesthetized with 1% lidocaine. Maximal barrier sterile technique was utilized including caps, mask, sterile gowns, sterile gloves, sterile drape, hand hygiene and skin antiseptic. A small incision was made with #11 blade scalpel. A 21 gauge needle directed into the right internal jugular vein with ultrasound guidance. A micropuncture dilator set was placed. A 23 cm tip to cuff Palindrome catheter was selected. The  skin below the right clavicle was anesthetized and a small incision was made with an #11 blade scalpel. A subcutaneous tunnel was formed to the vein dermatotomy site. The catheter was brought through the tunnel. The vein dermatotomy site was dilated to accommodate a peel-away sheath. The catheter was  placed through the peel-away sheath and directed into the central venous structures. The tip of the catheter was placed at the superior cavoatrial junction with fluoroscopy. Fluoroscopic images were obtained for documentation. Both lumens were found to aspirate and flush well. The proper amount of heparin was flushed in both lumens. The vein dermatotomy site was closed using a single layer of absorbable suture and Dermabond. Gel-Foam placed in the subcutaneous tract. The catheter was secured to the skin using Prolene suture. IMPRESSION: Successful placement of a right jugular tunneled dialysis catheter using ultrasound and fluoroscopic guidance. Electronically Signed   By: Richarda OverlieAdam  Henn M.D.   On: 05/07/2016 14:33   Dg Chest Port 1 View  Result Date: 05/06/2016 CLINICAL DATA:  Acute onset of generalized chest pain. Initial encounter. EXAM: PORTABLE CHEST 1 VIEW COMPARISON:  Chest radiograph performed 05/01/2016, and CT of the chest performed 05/02/2016 FINDINGS: The lungs are hypoexpanded. Vascular congestion and vascular crowding are noted. Increased interstitial markings may reflect mild interstitial edema. There is no evidence of pleural effusion or pneumothorax. The cardiomediastinal silhouette is mildly enlarged. No acute osseous abnormalities are seen. IMPRESSION: Lungs hypoexpanded. Vascular congestion and mild cardiomegaly. Increased interstitial markings could reflect mild interstitial edema. Electronically Signed   By: Roanna RaiderJeffery  Chang M.D.   On: 05/06/2016 02:48    Lab Data:  CBC:  Recent Labs Lab 05/03/16 0533 05/05/16 0543 05/07/16 0737 05/08/16 0720 05/09/16 1030  WBC 12.2* 11.9* 10.7* 11.3* 12.2*  HGB 8.2* 8.6* 9.5* 8.7* 8.4*  HCT 24.6*  25.4* 26.7* 28.8* 27.2* 27.4*  MCV 84.2 85.9 87.5 87.2 88.7  PLT 455* 511* 456* 423* 339   Basic Metabolic Panel:  Recent Labs Lab 05/03/16 1520 05/05/16 0543 05/06/16 0213 05/07/16 0737 05/08/16 0720 05/09/16 1030  NA 138 142  142 142  143 142 142  K 3.2* 3.3*  3.3* 3.4* 3.7 3.7 3.9  CL 100* 95*  95* 97* 98* 98* 100*  CO2 24 26  25 25 27 29 28   GLUCOSE 140* 163*  170* 173* 169* 169* 155*  BUN 117* 131*  132* 130* 129* 131* 94*  CREATININE 7.65* 7.18*  7.22* 7.01* 6.66* 6.59* 5.64*  CALCIUM 8.2* 8.9  8.8* 8.7* 8.8* 8.7* 8.8*  PHOS 8.8* 7.6* 7.2* 6.6*  --  5.1*   GFR: Estimated Creatinine Clearance: 19.2 mL/min (A) (by C-G formula based on SCr of 5.64 mg/dL (H)). Liver Function Tests:  Recent Labs Lab 05/03/16 1520 05/05/16 0543 05/06/16 0213 05/07/16 0737 05/09/16 1030  ALBUMIN 2.1* 2.0* 2.1* 2.1* 2.0*   No results for input(s): LIPASE, AMYLASE in the last 168 hours. No results for input(s): AMMONIA in the last 168 hours. Coagulation Profile:  Recent Labs Lab 05/07/16 0737 05/08/16 0720  INR 1.70 1.90   Cardiac Enzymes:  Recent Labs Lab 05/06/16 0213  TROPONINI 0.04*   BNP (last 3 results) No results for input(s): PROBNP in the last 8760 hours. HbA1C: No results for input(s): HGBA1C in the last 72 hours. CBG:  Recent Labs Lab 05/08/16 1043 05/08/16 1645 05/08/16 2222 05/09/16 0815 05/09/16 1415  GLUCAP 173* 157* 159* 151* 156*   Lipid Profile: No results for input(s): CHOL, HDL, LDLCALC, TRIG, CHOLHDL, LDLDIRECT in the  last 72 hours. Thyroid Function Tests: No results for input(s): TSH, T4TOTAL, FREET4, T3FREE, THYROIDAB in the last 72 hours. Anemia Panel:  Recent Labs  05/07/16 0737  FERRITIN 1,069*  TIBC NOT CALCULATED  IRON 24*   Urine analysis:    Component Value Date/Time   COLORURINE STRAW (A) 05/01/2016 1144   APPEARANCEUR CLEAR 05/01/2016 1144   LABSPEC 1.009 05/01/2016 1144   PHURINE 5.0 05/01/2016 1144   GLUCOSEU NEGATIVE 05/01/2016 1144   HGBUR SMALL (A) 05/01/2016 1144   BILIRUBINUR NEGATIVE 05/01/2016 1144   KETONESUR NEGATIVE 05/01/2016 1144   PROTEINUR 30 (A) 05/01/2016 1144   UROBILINOGEN 0.2 10/26/2013 1721   NITRITE NEGATIVE 05/01/2016 1144    LEUKOCYTESUR NEGATIVE 05/01/2016 1144     RAI,RIPUDEEP M.D. [facility] 05/09/2016, 3:25 PM  Pager: [phone] Between 7am to 7pm - call Pager - [phone]  After 7pm go to [email or web address] - password [email or web address]  Call night coverage person covering after 7pm

## 2016-05-09 NOTE — Progress Notes (Addendum)
[facility] Progress Note   Subjective:  HD #2 today Tolerating fine Complains that neck hurts and as result of that declined to get her access done yesterday  No weight pre HD (pre/post 1st HD 141->138.5)  Exam: Gen morbidly obese, awake says does not feel like talking BP (!) 142/91   Pulse 98   Temp 98.5 F (36.9 C)   Resp 13   Ht 5\' 6"  (1.676 m)   Wt (!) 138.5 kg (305 lb 5.4 oz)   LMP 04/11/2016   SpO2 96%   BMI 49.28 kg/m   Seen in HD - complains of sore neck No jvd or bruits Anteriorly clear Rhythm regular, normal heart sounds Abd soft ntnd no mass or ascites +bs Huge pannus Ext diffuse 2-3+ pitting edema of arms and lower legs- improved Chronic skin changes/darkening of LE's Dressings on LE wounds Some bruising for R IJ TDC placed 3/27  Inpatient medications: . amLODipine  10 mg Oral Daily  . atorvastatin  20 mg Oral Daily  . calcium acetate  667 mg Oral TID WC  . carvedilol  25 mg Oral BID WC  . darbepoetin (ARANESP) injection - NON-DIALYSIS  150 mcg Subcutaneous Q Thu-1800  . doxycycline  100 mg Oral Q12H  . ferrous sulfate  325 mg Oral TID WC  . furosemide  80 mg Oral BID  . heparin  5,000 Units Subcutaneous Q8H  . hydrALAZINE  100 mg Oral TID  . insulin aspart  0-5 Units Subcutaneous QHS  . insulin aspart  0-9 Units Subcutaneous TID WC  . isosorbide mononitrate  30 mg Oral Daily  . multivitamin  1 tablet Oral QHS  . polyethylene glycol  17 g Oral Daily  . potassium chloride  40 mEq Oral Daily  . senna-docusate  1 tablet Oral BID     Recent Labs Lab 05/06/16 0213 05/07/16 0737 05/08/16 0720 05/09/16 1030  NA 142 143 142 142  K 3.4* 3.7 3.7 3.9  CL 97* 98* 98* 100*  CO2 25 27 29 28   GLUCOSE 173* 169* 169* 155*  BUN 130* 129* 131* 94*  CREATININE 7.01* 6.66* 6.59* 5.64*  CALCIUM 8.7* 8.8* 8.7* 8.8*  PHOS 7.2* 6.6*  --  5.1*    Recent Labs Lab 05/06/16 0213 05/07/16 0737 05/09/16 1030  ALBUMIN 2.1* 2.1* 2.0*     Recent Labs Lab 05/07/16 0737 05/08/16 0720 05/09/16 1030  WBC 10.7* 11.3* 12.2*  HGB 9.5* 8.7* 8.4*  HCT 28.8* 27.2* 27.4*  MCV 87.5 87.2 88.7  PLT 456* 423* 339     Iron/TIBC/Ferritin/ %Sat    Component Value Date/Time   IRON 24 (L) 05/07/2016 0737   TIBC NOT CALCULATED 05/07/2016 0737   FERRITIN 1,069 (H) 05/07/2016 0737   IRONPCTSAT NOT CALCULATED 05/07/2016 0737    Assessment: 1. 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 Admitted with sig fluid overload, anasarca/pulm edema on CXR, creat 8's. . Diuresed somewhat but still sig volume overload. Dialysis initiated 05/08/16. TDC (IR 3/27). VVS has seen. Probable L AVG. Will need to be rescheduled as she refused yesterday. HD #2 today. CLIPPED to [facility] MWF 2nd shift. 2. Anemia - of CKD +/- other. Transfused 2 U on 3/22.  Added darbe 150/week.  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 Ca acetate 1 c/meals for phos. Renal diet. 4. Morbid obesity 5. Debility -  needs aggressive PT 6. Hist CVA 7. DM - per primary service 8. Inner thigh wounds - local care,   Camille Balynthia Biridiana Twardowski, MD [facility] [phone] Pager 05/09/2016, 12:10 PM

## 2016-05-09 NOTE — Progress Notes (Signed)
PT Cancellation Note  Patient Details Name: Linda DykesDavenia McKinnon [name] MRN: [id number] DOB: 08/04/1975   Cancelled Treatment:    Reason Eval/Treat Not Completed: Patient at procedure or test/unavailable Pt currently in HD. Will reattempt as schedule allows.   Margot ChimesBrittany Smith, PT, DPT  Acute Rehabilitation Services  Pager: [phone]    Melvyn NovasBrittany L Smith 05/09/2016, 1:05 PM

## 2016-05-09 NOTE — Progress Notes (Signed)
[facility] [address] .Start Date and Time 1st treatment Wednesday 05/15/16 at 12:00 pm  .Tentative Dialysis schedule is :Monday,Wednesday ,Friday chairtime 13:00pm

## 2016-05-09 NOTE — Progress Notes (Signed)
Nutrition Follow-up  DOCUMENTATION CODES:   Morbid obesity  INTERVENTION:  Provide Nepro Shake po TID, each supplement provides 425 kcal and 19 grams protein.  Encourage adequate PO intake.   Renal diet education handout given.   NUTRITION DIAGNOSIS:   Inadequate oral intake related to acute illness, poor appetite as evidenced by meal completion < 25%; ongoing  GOAL:   Patient will meet greater than or equal to 90% of their needs; not met  MONITOR:   PO intake, Supplement acceptance, Labs, Skin  REASON FOR ASSESSMENT:   Consult Diet education  ASSESSMENT:   41 y.o. female with medical history significant of morbid obesity, chronic kidney disease stage IV-V, diabetes mellitus, hypertension, prior CVA, diastolic CHF, presents to the emergency room with chief complaint of weakness as well as fever. Patient found to have HCAP Pt new ESRD on HD. HD initiated 3/28.   Meal completion has been 10-45%. Pt reports no PO intake yet today during time of visit. Pt does report nausea and abdominal discomfort. Pt agreeable to nutritional supplements to aid in caloric and protein needs. RD to order. Pt encouraged to eat her food at meals. Renal diet education handout given. Pt agreeable to look over and read the information. Will continue to monitor.   Labs and medications reviewed. Phosphorous elevated at 5.1.  Diet Order:  Diet renal/carb modified with fluid restriction Diet-HS Snack? Nothing; Room service appropriate? Yes; Fluid consistency: Thin  Skin:  Wound (see comment) (Stage II to R thigh)  Last BM:  3/28  Height:   Ht Readings from Last 1 Encounters:  05/07/16 _0  (1.676 m)    Weight:   Wt Readings from Last 1 Encounters:  05/08/16 (!) 305 lb 5.4 oz (138.5 kg)    Ideal Body Weight:  59 kg  BMI:  Body mass index is 49.28 kg/m.  Estimated Nutritional Needs:   Kcal:  2200-2400kcal/day   Protein:  116-145g/day   Fluid:  >2.2L/day   EDUCATION NEEDS:    Education needs no appropriate at this time  Corrin Parker, MS, RD, LDN Pager # [phone] After hours/ weekend pager # [phone]

## 2016-05-09 NOTE — Procedures (Signed)
I have personally attended this patient's dialysis session.   HD #2 4K bath UF goal 3 liters TDC no issues.  Camille Balynthia Ema Hebner, MD [facility] [phone] Pager 05/09/2016, 12:22 PM

## 2016-05-09 NOTE — Progress Notes (Signed)
PT Cancellation Note  Patient Details Name: Linda DykesDavenia McKinnon [name] MRN: [id number] DOB: 01/18/1976   Cancelled Treatment:    Reason Eval/Treat Not Completed: Patient at procedure or test/unavailable.  Pt is HD.  Will see her as schedule allows or 3/30 as able. 05/09/2016  [place] BingKen Leoda Smithhart, PT [phone]  (pager)   Eliseo GumKenneth V Cyndra Feinberg 05/09/2016, 1:31 PM

## 2016-05-09 NOTE — Progress Notes (Signed)
Patient arrived to unit by bed.  Reviewed treatment plan and this RN agrees with plan.  Report received from bedside RN, Mathis FareAlbert.  Consent verified.  Patient Alert, oriented to person, place only.   Lung sounds diminished to ausculation in all fields. Generalized edema. Cardiac:  ST.  Removed caps and cleansed RIJ catheter with chlorhedxidine.  Aspirated ports of heparin and flushed them with saline per protocol.  Connected and secured lines, initiated treatment at 1007.  UF Goal of 2500 mL and net fluid removal 2 L.  Will continue to monitor.

## 2016-05-09 NOTE — Progress Notes (Signed)
[facility]  Patient Status: Active (receiving services up to time of hospitalization)  [facility] is providing the following services: RN and MSW  If patient discharges after hours, please call [phone].   Linda FurnishDonna Fellmy 05/09/2016, 12:17 PM

## 2016-05-10 LAB — GLUCOSE, CAPILLARY
Glucose-Capillary: 164 mg/dL — ABNORMAL HIGH (ref 65–99)
Glucose-Capillary: 163 mg/dL — ABNORMAL HIGH (ref 65–99)
Glucose-Capillary: 174 mg/dL — ABNORMAL HIGH (ref 65–99)

## 2016-05-10 NOTE — Progress Notes (Addendum)
qPhysical Therapy Treatment Patient Details Name: [name] MRN: [id number] DOB: 02-23-1975 Today's Date: 05/10/2016    History of Present Illness 41 y.o. female with medical history significant of morbid obesity, chronic kidney disease stage IV-V, diabetes mellitus, hypertension, prior CVA, diastolic CHF, presents to the emergency room with chief complaint of weakness as well as fever and diagnosed with acute hypoxic resp failure with acute encephalopathy    PT Comments    Pt limited by lethargy and sleepiness this session. Pt demonstrating low tone and unable to assist with basic sit<>Stand transfer this session. Pt requiring max-total A with bed mobility and scooting in bed. Oxygen sats at 85% on RA at end of session. 2L oxygen applied and sats back up to 94%. RN notified. Updated d/c plan to SNF secondary to pt's decreased tolerance for therapy session. Will continue to follow and progress mobility as appropriate.    Follow Up Recommendations  SNF;Supervision/Assistance - 24 hour     Equipment Recommendations  Hospital bed;Other (comment);Wheelchair (measurements PT);Wheelchair cushion (measurements PT)    Recommendations for Other Services       Precautions / Restrictions Precautions Precautions: Fall Restrictions Weight Bearing Restrictions: No    Mobility  Bed Mobility Overal bed mobility: Needs Assistance Bed Mobility: Rolling;Supine to Sit;Sit to Supine Rolling: Max assist   Supine to sit: Max assist;Total assist;+2 for physical assistance Sit to supine: Total assist;+2 for physical assistance   General bed mobility comments: Supine<>sit X 2. First attempt pt requiring total assist. Verbal cues for participation, however, participation limited. Second attempt, pt reporting she couldnt breathe and was max assist +2 for sit<>supine transfer. Assist required for upper body and LE.   Transfers Overall transfer level: Needs assistance Equipment used: 2 person  hand held assist Transfers: Sit to/from Stand Sit to Stand: Total assist;+2 physical assistance         General transfer comment: Attempted standing with stedy, however, pt unable to use appropriately. Pt reported fear of falling with use. Attempted to stand using sheet as belt and pads. Pt with low tone and not participatory for standing. Totals A +2, however, not able to perform stand. Required total A to scoot hips back on EOB. Pt reports fear of falling throughout transfer attempts and was not willing to lean forward to perform transfer. Required manual assist for forward lean.   Ambulation/Gait                 Stairs            Wheelchair Mobility    Modified Rankin (Stroke Patients Only)       Balance Overall balance assessment: Needs assistance Sitting-balance support: Single extremity supported;Bilateral upper extremity supported;Feet supported Sitting balance-Leahy Scale: Fair Sitting balance - Comments: Demonstrated lean to L with sitting balance, however, able to self correct to midline with cues. Did not maintain midline position however. Multiple cues for midline posture throughout sitting balance. Pt with low tone this session. Pt sat EOB ~ 10-15 min. During second sitting session on EOB, pt falling asleep and required cues and total assist +2 for transition back to supine,.                                     Cognition Arousal/Alertness: Lethargic Behavior During Therapy: Flat affect Overall Cognitive Status: Difficult to assess  General Comments: Pt lethargic this session and required multiple cues for participation this session.       Exercises      General Comments General comments (skin integrity, edema, etc.): Pt lethargic/sleepy during session. Max encouragement for participation this session. Oxygen checked at end of session and sats at 85% on RA with HR at 119. Applied 2L of oxygen  and sats at 94% and HR down to 112.       Pertinent Vitals/Pain Pain Assessment: Faces Faces Pain Scale: Hurts little more Pain Location: Bilateral thigh wounds Pain Descriptors / Indicators: Sore;Grimacing Pain Intervention(s): Limited activity within patient's tolerance;Monitored during session;Repositioned    Home Living                      Prior Function            PT Goals (current goals can now be found in the care plan section) Acute Rehab PT Goals Patient Stated Goal: Wants to be able to move well enough to qualify for HD PT Goal Formulation: With patient Time For Goal Achievement: 05/22/16 Potential to Achieve Goals: Fair Progress towards PT goals: Progressing toward goals    Frequency    Min 3X/week      PT Plan Discharge plan needs to be updated    Co-evaluation             End of Session Equipment Utilized During Treatment: Other (comment) (sheet as gait belt) Activity Tolerance: Patient limited by fatigue;Patient limited by lethargy Patient left: in bed;with call bell/phone within reach Nurse Communication: Mobility status PT Visit Diagnosis: Muscle weakness (generalized) (M62.81);Other abnormalities of gait and mobility (R26.89)     Time: 1610-9604 PT Time Calculation (min) (ACUTE ONLY): 47 min  Charges:  $Therapeutic Activity: 38-52 mins                    G Codes:       [name], PT, DPT  Acute Rehabilitation Services  Pager: [phone]    [name] 05/10/2016, 5:05 PM

## 2016-05-10 NOTE — Progress Notes (Signed)
[facility] Progress Note   Subjective:  HD #2 yesterday Cut a little short d/t clotted dialyzer Complains that neck hurts and as result of that declined to get her access done  PT currently trying to work with her   Exam: Gen morbidly obese, awake PT in the room BP 129/71 (BP Location: Right Wrist)   Pulse (!) 107   Temp (!) 100.5 F (38.1 C) (Oral)   Resp 17   Ht  (1.676 m)   Wt (!) 139 kg (306 lb 7 oz)   LMP 04/11/2016   SpO2 94%   BMI 49.46 kg/m  Complains of sore neck No jvd or bruits Anteriorly clear Rhythm regular, normal heart sounds Abd soft ntnd no mass or ascites +bs Huge pannus Ext diffuse 2-3+ pitting edema of arms and lower legs- improved Chronic skin changes/darkening of LE's Dressings on LE wounds Some bruising for R IJ TDC placed 3/27  Inpatient medications: . amLODipine  10 mg Oral Daily  . atorvastatin  20 mg Oral Daily  . calcium acetate  667 mg Oral TID WC  . carvedilol  25 mg Oral BID WC  . darbepoetin (ARANESP) injection - NON-DIALYSIS  150 mcg Subcutaneous Q Thu-1800  . doxycycline  100 mg Oral Q12H  . feeding supplement (NEPRO CARB STEADY)  237 mL Oral TID BM  . ferric gluconate (FERRLECIT/NULECIT) IV  125 mg Intravenous Q T,Th,Sa-HD  . furosemide  80 mg Oral BID  . heparin  5,000 Units Subcutaneous Q8H  . hydrALAZINE  100 mg Oral TID  . insulin aspart  0-5 Units Subcutaneous QHS  . insulin aspart  0-9 Units Subcutaneous TID WC  . isosorbide mononitrate  30 mg Oral Daily  . multivitamin  1 tablet Oral QHS  . polyethylene glycol  17 g Oral Daily  . potassium chloride  40 mEq Oral Daily  . senna-docusate  1 tablet Oral BID     Recent Labs Lab 05/06/16 0213 05/07/16 0737 05/08/16 0720 05/09/16 1030  NA 142 143 142 142  K 3.4* 3.7 3.7 3.9  CL 97* 98* 98* 100*  CO2 GLUCOSE 173* 169* 169* 155*  BUN 130* 129* 131* 94*  CREATININE 7.01* 6.66* 6.59* 5.64*  CALCIUM 8.7* 8.8* 8.7* 8.8*  PHOS 7.2*  6.6*  --  5.1*    Recent Labs Lab 05/06/16 0213 05/07/16 0737 05/09/16 1030  ALBUMIN 2.1* 2.1* 2.0*    Recent Labs Lab 05/07/16 0737 05/08/16 0720 05/09/16 1030  WBC 10.7* 11.3* 12.2*  HGB 9.5* 8.7* 8.4*  HCT 28.8* 27.2* 27.4*  MCV 87.5 87.2 88.7  PLT 456* 423* 339     Iron/TIBC/Ferritin/ %Sat    Component Value Date/Time   IRON 24 (L) 05/07/2016 0737   TIBC NOT CALCULATED 05/07/2016 0737   FERRITIN 1,069 (H) 05/07/2016 0737   IRONPCTSAT NOT CALCULATED 05/07/2016 0737    Assessment: 1. 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 Admitted with sig fluid overload, anasarca/pulm edema on CXR, creat 8's. . Diuresed somewhat but still sig volume overload. Dialysis initiated 05/08/16. TDC (IR 3/27). VVS has seen. Probable L AVG. Will need to be rescheduled as she refused on 3/29 HD #3 tomorrow CLIPPED to [facility] MWF 2nd shift. She will need to have her access done AND be able to sit in the chair for 5-6 hours. Have tried to make this clear to her - otherwise cannot do outpt  HD. For unclear reasons she seems less motivated past 2 days to participate 2. Anemia - of CKD +/- other. Transfused 2 U on 3/22.  Added darbe 150/week.  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 Ca acetate 1 c/meals for phos. Renal diet. 4. Morbid obesity 5. Debility - needs aggressive PT 6. Hist CVA 7. DM - per primary service 8. Inner thigh wounds - local care,   Camille Bal, MD [facility] [phone] Pager 05/10/2016, 4:50 PM

## 2016-05-10 NOTE — Progress Notes (Signed)
[facility]                                                                              Patient Demographics  [name], is a 41 y.o. female, DOB - 1976-01-08, ZOX:[id number]  Admit date - 05/01/2016   Admitting Physician Costin Otelia Sergeant, MD  Outpatient Primary MD for the patient is Marletta Lor, NP  Outpatient specialists:   LOS - 9  days    Chief Complaint  Patient presents with  . wounds       Brief summary  41 y.o.femalewith medical history significant for morbid obesity, chronic kidney disease stage IV-V, diabetes mellitus, hypertension, prior CVA, diastolic CHF who presented to ED with progressive weakness, fever for past 1 week prior to this admission. She is actually bed bound for past year or so per her husband report ever since stroke. She also had dry cough and shortness of breath for past 1 week. She was hemodynamically stable on admission. Her creatinine was 7.9 (much worse since her recent Cr values of 4-5). Renal has seen her in consultation. Due to her debility she is not a good candidate for HD. Palliative care was consulted as well.      Assessment & Plan   Acute metabolic encephalopathy - Improving, at baseline mental status per family - Likely multifactorial from history of CVA, end stage renal disease, possible uremia, acute infectious process - possible pneumonia  Chest pain episode  -  Troponin 1 was 0.04 however d-dimer was 2.01 - VQ scan Negative for any PE no further chest pain episodes  Acute renal failure superimposed on CKD stage 5 not on HD / Acute pulmonary edema  - Due to progressive disease from uncontrolled diabetes and FSG. Patient was placed on aggressive IV Lasix and metolazone per renal recommendations. - Lasix transitioned to oral.  - Tunneled dialysis cath Placed on 3/27 - Hemodialysis initiated 3/28, Second HD on 3/29 - Vascular surgery consulted, patient refused with graft placement - Accepted at  [facility] [address] .Start Date and Time 1st treatment Wednesday 05/15/16 at 12:00 pm, tentative schedule MWF  - Vascular surgery also following, recommended patient to have fistula surgery while inpatient  Acute respiratory failure with hypoxia / Right middle lobe pneumonia / Leukocytosis  - CT chest without contrast showed linear density over the right middle lobe likely atelectasis, although cannot completely exclude infection - She is on empiric doxycycline  - Blood cultures 1/2 showed colitis negative staph likely contaminant    Anemia of chronic disease / IDA - Due to CKD - Status post 2 units PRBC transfusion on 05/02/2016  - Aranesp 3/22, continue iron supplementation, B12  Uncontrolled diabetes mellitus with diabetic nephropathy with long term insulin use - Continue current insulin regimen: SSI  Dyslipidemia associated with type 2 DM - Continue statin therapy   Essential hypertension - Continue Norvasc, coreg, hydralazine, Imdur   Bilateral inner thigh wounds - Related to moisture and friction from thighs rubbing together - Left inner thigh has large area with many open areas scattered throughout, 17cm x 12cm x 0.1cm  - Right inner thigh wound  is 8cm x 5cm x 0.3cm  - Dressing procedure/placement/frequency: Cleanse with NS, gently pat dry, apply Xeroform gauze, top with dry gauze,change every shift. Pt is already on a low air loss mattress  Morbid obesity / Debility  - Nutrition consulted - Body mass index is 54.58 kg/m.  Code Status: full  DVT Prophylaxis:  heparin  Family Communication: Discussed in detail with the patient, all imaging results, lab results explained to the patient. Husband by the bedside   Disposition Plan: When cleared by nephrology  Time Spent in minutes 25 minutes  Procedures:  Hemodialysis is initiated 3/28  Consultants:    Nephrology  WOC  Palliative care   Nutrition  Antimicrobials:  Doxycycline 3/21->    Medications  Scheduled Meds: . amLODipine  10 mg Oral Daily  . atorvastatin  20 mg Oral Daily  . calcium acetate  667 mg Oral TID WC  . carvedilol  25 mg Oral BID WC  . darbepoetin (ARANESP) injection - NON-DIALYSIS  150 mcg Subcutaneous Q Thu-1800  . doxycycline  100 mg Oral Q12H  . feeding supplement (NEPRO CARB STEADY)  237 mL Oral TID BM  . ferric gluconate (FERRLECIT/NULECIT) IV  125 mg Intravenous Q T,Th,Sa-HD  . furosemide  80 mg Oral BID  . heparin  5,000 Units Subcutaneous Q8H  . hydrALAZINE  100 mg Oral TID  . insulin aspart  0-5 Units Subcutaneous QHS  . insulin aspart  0-9 Units Subcutaneous TID WC  . isosorbide mononitrate  30 mg Oral Daily  . multivitamin  1 tablet Oral QHS  . polyethylene glycol  17 g Oral Daily  . potassium chloride  40 mEq Oral Daily  . senna-docusate  1 tablet Oral BID   Continuous Infusions: . sodium chloride     PRN Meds:.acetaminophen, albuterol, camphor-menthol, guaiFENesin-dextromethorphan, HYDROcodone-acetaminophen, morphine injection, nitroGLYCERIN, ondansetron **OR** ondansetron (ZOFRAN) IV   Antibiotics   Anti-infectives    Start     Dose/Rate Route Frequency Ordered Stop   05/08/16 1000  vancomycin (VANCOCIN) IVPB 1000 mg/200 mL premix     1,000 mg 200 mL/hr over 60 Minutes Intravenous To Surgery 05/07/16 1904 05/08/16 1004   05/08/16 0902  vancomycin (VANCOCIN) 1-5 GM/200ML-% IVPB    Comments:  Matilde Haymaker, [name]   : cabinet override      05/08/16 0902 05/08/16 0904   05/07/16 1300  ceFAZolin (ANCEF) 3 g in dextrose 5 % 50 mL IVPB     3 g 130 mL/hr over 30 Minutes Intravenous To Radiology 05/07/16 1232 05/07/16 1330   05/02/16 2200  doxycycline (VIBRA-TABS) tablet 100 mg     100 mg Oral Every 12 hours 05/02/16 1732     05/01/16 2200  piperacillin-tazobactam (ZOSYN) IVPB 2.25 g  Status:  Discontinued     2.25 g 100 mL/hr over 30 Minutes Intravenous Every 8 hours 05/01/16 1324 05/02/16 1729   05/01/16 1330  vancomycin  (VANCOCIN) 2,500 mg in sodium chloride 0.9 % 500 mL IVPB     2,500 mg 250 mL/hr over 120 Minutes Intravenous  Once 05/01/16 1309 05/01/16 1551   05/01/16 1300  piperacillin-tazobactam (ZOSYN) IVPB 3.375 g     3.375 g 100 mL/hr over 30 Minutes Intravenous  Once 05/01/16 1258 05/01/16 1340   05/01/16 1300  vancomycin (VANCOCIN) IVPB 1000 mg/200 mL premix  Status:  Discontinued     1,000 mg 200 mL/hr over 60 Minutes Intravenous  Once 05/01/16 1258 05/01/16 1308        Subjective:   Chanell  Ripley Lovecchio was seen and examined today. Denies any specific complaints today. Resting in the bed. .Patient denies dizziness, shortness of breath, abdominal pain, N/V/D/C, new weakness, numbess, tingling.   Objective:   Vitals:   05/09/16 1720 05/09/16 2052 05/10/16 0451 05/10/16 0955  BP: (!) 157/86 128/74 (!) 162/76 129/71  Pulse: 100 (!) 101 (!) 106 (!) 107  Resp: 19 17 16 17   Temp: 99.2 F (37.3 C) 98.5 F (36.9 C) 99.7 F (37.6 C) (!) 100.5 F (38.1 C)  TempSrc: Oral   Oral  SpO2: 96% 94% 94% 94%  Weight:  (!) 139 kg (306 lb 7 oz)    Height:        Intake/Output Summary (Last 24 hours) at 05/10/16 1153 Last data filed at 05/10/16 1040  Gross per 24 hour  Intake                0 ml  Output             2350 ml  Net            -2350 ml     Wt Readings from Last 3 Encounters:  05/09/16 (!) 139 kg (306 lb 7 oz)  03/29/16 (!) 167 kg (368 lb 3.2 oz)  03/09/16 (!) 168.3 kg (371 lb)     Exam  General: Alert and oriented 3 NAD  HEENT:  Neck: Supple, no JVD  Cardiovascular: S1 S2 clear, RRR  Respiratory: CTAB  Gastrointestinal: Morbidly obese, soft, NT, ND  Ext: no cyanosis clubbing or edema,  Neuro: no new deficits  Skin: No rashes  Psych: Normal affect and demeanor, alert and oriented x3    Data Reviewed:  I have personally reviewed following labs and imaging studies  Micro Results Recent Results (from the past 240 hour(s))  Urine culture     Status: None    Collection Time: 05/01/16 11:44 AM  Result Value Ref Range Status   Specimen Description URINE, RANDOM  Final   Special Requests NONE  Final   Culture   Final    NO GROWTH Performed at [facility], [address], [place], [place] [address]    Report Status 05/02/2016 FINAL  Final  Blood Culture (routine x 2)     Status: Abnormal   Collection Time: 05/01/16 12:20 PM  Result Value Ref Range Status   Specimen Description BLOOD LEFT ANTECUBITAL  Final   Special Requests BOTTLES DRAWN AEROBIC AND ANAEROBIC 5CC  Final   Culture  Setup Time   Final    GRAM POSITIVE COCCI IN CLUSTERS AEROBIC BOTTLE ONLY CRITICAL RESULT CALLED TO, READ BACK BY AND VERIFIED WITH: T GREEN PHARMD [id number] 05/02/16 A BROWNING    Culture (A)  Final    STAPHYLOCOCCUS SPECIES (COAGULASE NEGATIVE) THE SIGNIFICANCE OF ISOLATING THIS ORGANISM FROM A SINGLE SET OF BLOOD CULTURES WHEN MULTIPLE SETS ARE DRAWN IS UNCERTAIN. PLEASE NOTIFY THE MICROBIOLOGY DEPARTMENT WITHIN ONE WEEK IF SPECIATION AND SENSITIVITIES ARE REQUIRED. Performed at [facility], [address], [place], [place] [address]    Report Status 05/04/2016 FINAL  Final  Blood Culture ID Panel (Reflexed)     Status: None   Collection Time: 05/01/16 12:20 PM  Result Value Ref Range Status   Enterococcus species NOT DETECTED NOT DETECTED Final   Listeria monocytogenes NOT DETECTED NOT DETECTED Final   Staphylococcus species NOT DETECTED NOT DETECTED Final   Staphylococcus aureus NOT DETECTED NOT DETECTED Final   Streptococcus species NOT  DETECTED NOT DETECTED Final   Streptococcus agalactiae NOT DETECTED NOT DETECTED Final   Streptococcus pneumoniae NOT DETECTED NOT DETECTED Final   Streptococcus pyogenes NOT DETECTED NOT DETECTED Final   Acinetobacter baumannii NOT DETECTED NOT DETECTED Final   Enterobacteriaceae species NOT DETECTED NOT DETECTED Final   Enterobacter cloacae complex NOT DETECTED NOT DETECTED Final   Escherichia coli NOT  DETECTED NOT DETECTED Final   Klebsiella oxytoca NOT DETECTED NOT DETECTED Final   Klebsiella pneumoniae NOT DETECTED NOT DETECTED Final   Proteus species NOT DETECTED NOT DETECTED Final   Serratia marcescens NOT DETECTED NOT DETECTED Final   Haemophilus influenzae NOT DETECTED NOT DETECTED Final   Neisseria meningitidis NOT DETECTED NOT DETECTED Final   Pseudomonas aeruginosa NOT DETECTED NOT DETECTED Final   Candida albicans NOT DETECTED NOT DETECTED Final   Candida glabrata NOT DETECTED NOT DETECTED Final   Candida krusei NOT DETECTED NOT DETECTED Final   Candida parapsilosis NOT DETECTED NOT DETECTED Final   Candida tropicalis NOT DETECTED NOT DETECTED Final    Comment: Performed at [facility], [address] [place], [place] [address]  Blood Culture (routine x 2)     Status: None   Collection Time: 05/01/16 12:26 PM  Result Value Ref Range Status   Specimen Description BLOOD LEFT HAND  Final   Special Requests BOTTLES DRAWN AEROBIC AND ANAEROBIC 5CC  Final   Culture   Final    NO GROWTH 5 DAYS Performed at [facility], [address] [place], [place] [address]    Report Status 05/06/2016 FINAL  Final    Radiology Reports Dg Chest 2 View  Result Date: 05/01/2016 CLINICAL DATA:  Fever and weakness. EXAM: CHEST  2 VIEW COMPARISON:  03/22/2016. FINDINGS: Cardiomegaly with mild bilateral interstitial prominence. No pleural effusion or pneumothorax. No acute bony abnormality. IMPRESSION: Cardiomegaly with mild bilateral interstitial prominence consistent with CHF. Findings have improved from prior study of 03/22/2016. Given patient's history of fever pneumonitis cannot be excluded. Electronically Signed   By: Maisie Fus  Register   On: 05/01/2016 13:30   Ct Chest Wo Contrast  Result Date: 05/02/2016 CLINICAL DATA:  Morbid obesity week chronic kidney disease and diabetes. Complains of weakness and fever over the past week. Cough. EXAM: CT CHEST WITHOUT CONTRAST TECHNIQUE:  Multidetector CT imaging of the chest was performed following the standard protocol without IV contrast. COMPARISON:  Chest x-ray 05/01/2016 FINDINGS: Cardiovascular: Mild to moderate cardiomegaly. Remaining vascular structures are unremarkable. Mediastinum/Nodes: No definite mediastinal or hilar adenopathy. Remaining mediastinal structures are unremarkable. Lungs/Pleura: Lungs are somewhat hypoinflated and demonstrate mild bibasilar linear atelectasis. Linear density over the posterior and anterior right middle lobe likely atelectasis. No evidence of effusion. Airways are unremarkable. Upper Abdomen: Mild calcified plaque over the celiac axis, splenic artery and superior mesenteric arteries. Musculoskeletal: Minimal degenerative change of the spine. Prominent overlying soft tissues. IMPRESSION: Linear density over the right middle lobe likely atelectasis, although cannot completely exclude infection. Minimal linear bibasilar atelectasis. Moderate cardiomegaly. Electronically Signed   By: Elberta Fortis M.D.   On: 05/02/2016 15:53   Nm Pulmonary Perf And Vent  Result Date: 05/06/2016 CLINICAL DATA:  Chest pain EXAM: NUCLEAR MEDICINE VENTILATION - PERFUSION LUNG SCAN VIEWS: Anterior, posterior, RAO, LAO, RPO, LPO-ventilation and profusion. RADIOPHARMACEUTICALS:  32.6 mCi Technetium-33m DTPA aerosol inhalation and 4.2 mCi Technetium-43m MAA IV COMPARISON:  Chest radiograph May 06, 2016 FINDINGS: Ventilation: Radiotracer uptake is homogeneous and symmetric. No ventilation defects are evident. Perfusion: Radiotracer uptake  is homogeneous and symmetric bilaterally. No perfusion defects evident. Cardiomegaly noted. IMPRESSION: No appreciable ventilation or perfusion defects. This study constitutes a very low probability of pulmonary embolus. Cardiomegaly noted. Electronically Signed   By: Bretta Bang III M.D.   On: 05/06/2016 14:19   Ir Fluoro Guide Cv Line Right  Result Date: 05/07/2016 INDICATION:  End-stage renal disease and starting hemodialysis. EXAM: FLUOROSCOPIC AND ULTRASOUND GUIDED PLACEMENT OF A TUNNELED DIALYSIS CATHETER Physician: Rachelle Hora. Lowella Dandy, MD MEDICATIONS: Ancef 3 g; The antibiotic was administered within an appropriate time interval prior to skin puncture. ANESTHESIA/SEDATION: Versed 2.0 mg IV; Fentanyl 100 mcg IV; Moderate Sedation Time:  20 minutes The patient was continuously monitored during the procedure by the interventional radiology nurse under my direct supervision. FLUOROSCOPY TIME:  Fluoroscopy Time: 24 seconds, 2.5 mGy COMPLICATIONS: None immediate. PROCEDURE: Informed consent was obtained for placement of a tunneled dialysis catheter. The patient was placed supine on the interventional table. Ultrasound confirmed a patent right internal jugular vein. Ultrasound images were obtained for documentation. The right side of the neck was prepped and draped in a sterile fashion. The right side of the neck was anesthetized with 1% lidocaine. Maximal barrier sterile technique was utilized including caps, mask, sterile gowns, sterile gloves, sterile drape, hand hygiene and skin antiseptic. A small incision was made with #11 blade scalpel. A 21 gauge needle directed into the right internal jugular vein with ultrasound guidance. A micropuncture dilator set was placed. A 23 cm tip to cuff Palindrome catheter was selected. The skin below the right clavicle was anesthetized and a small incision was made with an #11 blade scalpel. A subcutaneous tunnel was formed to the vein dermatotomy site. The catheter was brought through the tunnel. The vein dermatotomy site was dilated to accommodate a peel-away sheath. The catheter was placed through the peel-away sheath and directed into the central venous structures. The tip of the catheter was placed at the superior cavoatrial junction with fluoroscopy. Fluoroscopic images were obtained for documentation. Both lumens were found to aspirate and flush well.  The proper amount of heparin was flushed in both lumens. The vein dermatotomy site was closed using a single layer of absorbable suture and Dermabond. Gel-Foam placed in the subcutaneous tract. The catheter was secured to the skin using Prolene suture. IMPRESSION: Successful placement of a right jugular tunneled dialysis catheter using ultrasound and fluoroscopic guidance. Electronically Signed   By: Richarda Overlie M.D.   On: 05/07/2016 14:33   Ir US Guide Vasc Access Right  Result Date: 05/07/2016 INDICATION: End-stage renal disease and starting hemodialysis. EXAM: FLUOROSCOPIC AND ULTRASOUND GUIDED PLACEMENT OF A TUNNELED DIALYSIS CATHETER Physician: Rachelle Hora. Lowella Dandy, MD MEDICATIONS: Ancef 3 g; The antibiotic was administered within an appropriate time interval prior to skin puncture. ANESTHESIA/SEDATION: Versed 2.0 mg IV; Fentanyl 100 mcg IV; Moderate Sedation Time:  20 minutes The patient was continuously monitored during the procedure by the interventional radiology nurse under my direct supervision. FLUOROSCOPY TIME:  Fluoroscopy Time: 24 seconds, 2.5 mGy COMPLICATIONS: None immediate. PROCEDURE: Informed consent was obtained for placement of a tunneled dialysis catheter. The patient was placed supine on the interventional table. Ultrasound confirmed a patent right internal jugular vein. Ultrasound images were obtained for documentation. The right side of the neck was prepped and draped in a sterile fashion. The right side of the neck was anesthetized with 1% lidocaine. Maximal barrier sterile technique was utilized including caps, mask, sterile gowns, sterile gloves, sterile drape, hand hygiene and skin antiseptic. A  small incision was made with #11 blade scalpel. A 21 gauge needle directed into the right internal jugular vein with ultrasound guidance. A micropuncture dilator set was placed. A 23 cm tip to cuff Palindrome catheter was selected. The skin below the right clavicle was anesthetized and a small  incision was made with an #11 blade scalpel. A subcutaneous tunnel was formed to the vein dermatotomy site. The catheter was brought through the tunnel. The vein dermatotomy site was dilated to accommodate a peel-away sheath. The catheter was placed through the peel-away sheath and directed into the central venous structures. The tip of the catheter was placed at the superior cavoatrial junction with fluoroscopy. Fluoroscopic images were obtained for documentation. Both lumens were found to aspirate and flush well. The proper amount of heparin was flushed in both lumens. The vein dermatotomy site was closed using a single layer of absorbable suture and Dermabond. Gel-Foam placed in the subcutaneous tract. The catheter was secured to the skin using Prolene suture. IMPRESSION: Successful placement of a right jugular tunneled dialysis catheter using ultrasound and fluoroscopic guidance. Electronically Signed   By: Richarda Overlie M.D.   On: 05/07/2016 14:33   Dg Chest Port 1 View  Result Date: 05/06/2016 CLINICAL DATA:  Acute onset of generalized chest pain. Initial encounter. EXAM: PORTABLE CHEST 1 VIEW COMPARISON:  Chest radiograph performed 05/01/2016, and CT of the chest performed 05/02/2016 FINDINGS: The lungs are hypoexpanded. Vascular congestion and vascular crowding are noted. Increased interstitial markings may reflect mild interstitial edema. There is no evidence of pleural effusion or pneumothorax. The cardiomediastinal silhouette is mildly enlarged. No acute osseous abnormalities are seen. IMPRESSION: Lungs hypoexpanded. Vascular congestion and mild cardiomegaly. Increased interstitial markings could reflect mild interstitial edema. Electronically Signed   By: Roanna Raider M.D.   On: 05/06/2016 02:48    Lab Data:  CBC:  Recent Labs Lab 05/05/16 0543 05/07/16 0737 05/08/16 0720 05/09/16 1030  WBC 11.9* 10.7* 11.3* 12.2*  HGB 8.6* 9.5* 8.7* 8.4*  HCT 26.7* 28.8* 27.2* 27.4*  MCV 85.9 87.5  87.2 88.7  PLT 511* 456* 423* 339   Basic Metabolic Panel:  Recent Labs Lab 05/03/16 1520 05/05/16 0543 05/06/16 0213 05/07/16 0737 05/08/16 0720 05/09/16 1030  NA 138 142  142 142 143 142 142  K 3.2* 3.3*  3.3* 3.4* 3.7 3.7 3.9  CL 100* 95*  95* 97* 98* 98* 100*  CO2 GLUCOSE 140* 163*  170* 173* 169* 169* 155*  BUN 117* 131*  132* 130* 129* 131* 94*  CREATININE 7.65* 7.18*  7.22* 7.01* 6.66* 6.59* 5.64*  CALCIUM 8.2* 8.9  8.8* 8.7* 8.8* 8.7* 8.8*  PHOS 8.8* 7.6* 7.2* 6.6*  --  5.1*   GFR: Estimated Creatinine Clearance: 19.3 mL/min (A) (by C-G formula based on SCr of 5.64 mg/dL (H)). Liver Function Tests:  Recent Labs Lab 05/03/16 1520 05/05/16 0543 05/06/16 0213 05/07/16 0737 05/09/16 1030  ALBUMIN 2.1* 2.0* 2.1* 2.1* 2.0*   No results for input(s): LIPASE, AMYLASE in the last 168 hours. No results for input(s): AMMONIA in the last 168 hours. Coagulation Profile:  Recent Labs Lab 05/07/16 0737 05/08/16 0720  INR 1.70 1.90   Cardiac Enzymes:  Recent Labs Lab 05/06/16 0213  TROPONINI 0.04*   BNP (last 3 results) No results for input(s): PROBNP in the last 8760 hours. HbA1C: No results for input(s): HGBA1C in the last 72 hours. CBG:  Recent Labs Lab 05/09/16 0815 05/09/16  1415 05/09/16 1718 05/09/16 2134 05/10/16 0802  GLUCAP 151* 156* 146* 154* 164*   Lipid Profile: No results for input(s): CHOL, HDL, LDLCALC, TRIG, CHOLHDL, LDLDIRECT in the last 72 hours. Thyroid Function Tests: No results for input(s): TSH, T4TOTAL, FREET4, T3FREE, THYROIDAB in the last 72 hours. Anemia Panel: No results for input(s): VITAMINB12, FOLATE, FERRITIN, TIBC, IRON, RETICCTPCT in the last 72 hours. Urine analysis:    Component Value Date/Time   COLORURINE STRAW (A) 05/01/2016 1144   APPEARANCEUR CLEAR 05/01/2016 1144   LABSPEC 1.009 05/01/2016 1144   PHURINE 5.0 05/01/2016 1144   GLUCOSEU NEGATIVE 05/01/2016 1144   HGBUR SMALL  (A) 05/01/2016 1144   BILIRUBINUR NEGATIVE 05/01/2016 1144   KETONESUR NEGATIVE 05/01/2016 1144   PROTEINUR 30 (A) 05/01/2016 1144   UROBILINOGEN 0.2 10/26/2013 1721   NITRITE NEGATIVE 05/01/2016 1144   LEUKOCYTESUR NEGATIVE 05/01/2016 1144     Zaki Gertsch M.D. [facility] 05/10/2016, 11:53 AM  Pager: [phone] Between 7am to 7pm - call Pager - [phone]  After 7pm go to [email or web address] - password [email or web address]  Call night coverage person covering after 7pm

## 2016-05-11 LAB — RENAL FUNCTION PANEL
ALBUMIN: 2.1 g/dL — AB (ref 3.5–5.0)
ANION GAP: 14 (ref 5–15)
BUN: 74 mg/dL — ABNORMAL HIGH (ref 6–20)
CALCIUM: 9.5 mg/dL (ref 8.9–10.3)
CO2: 28 mmol/L (ref 22–32)
Chloride: 104 mmol/L (ref 101–111)
Creatinine, Ser: 5.07 mg/dL — ABNORMAL HIGH (ref 0.44–1.00)
GFR, EST AFRICAN AMERICAN: 11 mL/min — AB (ref >60)
GFR, EST NON AFRICAN AMERICAN: 10 mL/min — AB (ref >60)
GLUCOSE: 182 mg/dL — AB (ref 65–99)
PHOSPHORUS: 4.3 mg/dL (ref 2.5–4.6)
Potassium: 4.3 mmol/L (ref 3.5–5.1)
SODIUM: 146 mmol/L — AB (ref 135–145)

## 2016-05-11 LAB — CBC
HCT: 27.8 % — ABNORMAL LOW (ref 36.0–46.0)
Hemoglobin: 8.4 g/dL — ABNORMAL LOW (ref 12.0–15.0)
MCH: 27.2 pg (ref 26.0–34.0)
MCHC: 30.2 g/dL (ref 30.0–36.0)
MCV: 90 fL (ref 78.0–100.0)
Platelets: 299 10*3/uL (ref 150–400)
RBC: 3.09 MIL/uL — ABNORMAL LOW (ref 3.87–5.11)
RDW: 14.8 % (ref 11.5–15.5)
WBC: 17.2 10*3/uL — ABNORMAL HIGH (ref 4.0–10.5)

## 2016-05-11 LAB — GLUCOSE, CAPILLARY
GLUCOSE-CAPILLARY: 151 mg/dL — AB (ref 65–99)
GLUCOSE-CAPILLARY: 207 mg/dL — AB (ref 65–99)
Glucose-Capillary: 212 mg/dL — ABNORMAL HIGH (ref 65–99)

## 2016-05-11 MED ORDER — ONDANSETRON HCL 4 MG/2ML IJ SOLN
INTRAMUSCULAR | Status: AC
  Filled 2016-05-11: qty 2

## 2016-05-11 NOTE — Progress Notes (Addendum)
[facility] Progress Note   Subjective:  Getting 3rd HD today HD #2 3/29 was cut a little short d/t clotted dialyzer Didn't do well with PT yesterday - I think partly motivation issue  Exam: BP (!) 157/86 (BP Location: Right Wrist)   Pulse (!) 103   Temp 100.2 F (37.9 C) (Oral) Comment (Src): Pt refused oral temp  Resp 16   Ht  (1.676 m)   Wt (!) 139 kg (306 lb 7 oz)   LMP 04/11/2016   SpO2 97%   BMI 49.46 kg/m  Complains of sore neck, wants her inhaler Anteriorly clear, posteriorly basilar crackles, no wheezing Rhythm regular, normal heart sounds. Tachy about 100 Abd soft ntnd no mass or ascites +bs Huge pannus Ext diffuse pitting edema of arms and lower legs- much improved Chronic skin changes/darkening of LE's/wrinkling now from fluid loss Dressings on LE wounds removed - both are smelly and need to be cleaned up Some bruising for R IJ TDC placed 3/27  Inpatient medications: . amLODipine  10 mg Oral Daily  . atorvastatin  20 mg Oral Daily  . calcium acetate  667 mg Oral TID WC  . carvedilol  25 mg Oral BID WC  . darbepoetin (ARANESP) injection - NON-DIALYSIS  150 mcg Subcutaneous Q Thu-1800  . doxycycline  100 mg Oral Q12H  . feeding supplement (NEPRO CARB STEADY)  237 mL Oral TID BM  . ferric gluconate (FERRLECIT/NULECIT) IV  125 mg Intravenous Q T,Th,Sa-HD  . heparin  5,000 Units Subcutaneous Q8H  . hydrALAZINE  100 mg Oral TID  . insulin aspart  0-5 Units Subcutaneous QHS  . insulin aspart  0-9 Units Subcutaneous TID WC  . isosorbide mononitrate  30 mg Oral Daily  . multivitamin  1 tablet Oral QHS  . polyethylene glycol  17 g Oral Daily  . potassium chloride  40 mEq Oral Daily  . senna-docusate  1 tablet Oral BID     Recent Labs Lab 05/06/16 0213 05/07/16 0737 05/08/16 0720 05/09/16 1030  NA 142 143 142 142  K 3.4* 3.7 3.7 3.9  CL 97* 98* 98* 100*  CO2 GLUCOSE 173* 169* 169* 155*  BUN 130* 129* 131* 94*   CREATININE 7.01* 6.66* 6.59* 5.64*  CALCIUM 8.7* 8.8* 8.7* 8.8*  PHOS 7.2* 6.6*  --  5.1*    Recent Labs Lab 05/06/16 0213 05/07/16 0737 05/09/16 1030  ALBUMIN 2.1* 2.1* 2.0*    Recent Labs Lab 05/07/16 0737 05/08/16 0720 05/09/16 1030  WBC 10.7* 11.3* 12.2*  HGB 9.5* 8.7* 8.4*  HCT 28.8* 27.2* 27.4*  MCV 87.5 87.2 88.7  PLT 456* 423* 339     Iron/TIBC/Ferritin/ %Sat    Component Value Date/Time   IRON 24 (L) 05/07/2016 0737   TIBC NOT CALCULATED 05/07/2016 0737   FERRITIN 1,069 (H) 05/07/2016 0737   IRONPCTSAT NOT CALCULATED 05/07/2016 0737    Assessment: 1. 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  Dialysis initiated 05/08/16. TDC (IR 3/27). [facility] has seen. Probable L AVG. Will need to be rescheduled as she refused on 3/29 HD #3 today then will get on MWF schedule.  CLIPPED to [facility] MWF 2nd shift. She will need to have her access done AND be able to sit in the chair for 5-6 hours. Have tried to make this clear to her again today - told her she HAS to start trying harder or  we will need to reassess the appropriateness of proceeding with HD. For unclear reasons she has been  less motivated past 3 days to participate 2. Anemia - of CKD +/- other. Transfused 2 U on 3/22.  Added darbe 150/week.  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 Ca acetate 1 c/meals for phos. Renal diet. 4. Morbid obesity 5. Debility - needs aggressive PT 6. Hist CVA 7. DM - per primary service 8. Inner thigh wounds - local care, dressing changes  Camille Bal, MD [facility] [phone] Pager 05/11/2016, 7:52 AM

## 2016-05-11 NOTE — Progress Notes (Signed)
Pt alert and oriented back from dialysis, family at bedside. Bp 174 systollic, gave bp medications.

## 2016-05-11 NOTE — Procedures (Signed)
I have personally attended this patient's dialysis session.   UF goal 3-4L as tolerated. TDC 300 2 K bath pending labs  Wants inhaler  Camille Bal, MD [facility] [phone] Pager 05/11/2016, 8:09 AM

## 2016-05-11 NOTE — Progress Notes (Signed)
[facility]                                                                              Patient Demographics  [name], is a 41 y.o. female, DOB - Jun 20, 1975, ZOX:[id number]  Admit date - 05/01/2016   Admitting Physician Costin Otelia Sergeant, MD  Outpatient Primary MD for the patient is Marletta Lor, NP  Outpatient specialists:   LOS - 10  days    Chief Complaint  Patient presents with  . wounds       Brief summary  41 y.o.femalewith medical history significant for morbid obesity, chronic kidney disease stage IV-V, diabetes mellitus, hypertension, prior CVA, diastolic CHF who presented to ED with progressive weakness, fever for past 1 week prior to this admission. She is actually bed bound for past year or so per her husband report ever since stroke. She also had dry cough and shortness of breath for past 1 week. She was hemodynamically stable on admission. Her creatinine was 7.9 (much worse since her recent Cr values of 4-5). Renal has seen her in consultation. Due to her debility she is not a good candidate for HD. Palliative care was consulted as well.      Assessment & Plan   Acute metabolic encephalopathy - Improving, at baseline mental status - Likely multifactorial from history of CVA, end stage renal disease, possible uremia, acute infectious process - possible pneumonia  Chest pain episode  -  Troponin 1 was 0.04 however d-dimer was 2.01 - VQ scan Negative for any PE no further chest pain episodes  Acute renal failure superimposed on CKD stage 5 not on HD / Acute pulmonary edema  - Due to progressive disease from uncontrolled diabetes and FSG. Patient was placed on aggressive IV Lasix and metolazone per renal recommendations. - Lasix transitioned to oral.  - Tunneled dialysis cath Placed on 3/27 - Hemodialysis initiated 3/28, Second HD on 3/29 - Vascular surgery consulted, patient refused with graft placement - Accepted at [facility] [address] .Start Date and Time 1st treatment Wednesday 05/15/16 at 12:00 pm, tentative schedule MWF  - Vascular surgery also following, recommended patient to have fistula surgery while inpatient. Patient appears less motivated for hemodialysis  Acute respiratory failure with hypoxia / Right middle lobe pneumonia / Leukocytosis  - CT chest without contrast showed linear density over the right middle lobe likely atelectasis, although cannot completely exclude infection - She is on empiric doxycycline  - Blood cultures 1/2 showed colitis negative staph likely contaminant    Anemia of chronic disease / IDA - Due to CKD - Status post 2 units PRBC transfusion on 05/02/2016  - Aranesp 3/22, continue iron supplementation, B12  Uncontrolled diabetes mellitus with diabetic nephropathy with long term insulin use - Continue current insulin regimen: SSI  Dyslipidemia associated with type 2 DM - Continue statin therapy   Essential hypertension - Continue Norvasc, coreg, hydralazine, Imdur   Bilateral inner thigh wounds - Related to moisture and friction from thighs rubbing together - Left inner thigh has large area with many open areas scattered throughout, 17cm x 12cm x 0.1cm  -  Right inner thigh wound is 8cm x 5cm x 0.3cm  - Dressing procedure/placement/frequency: Cleanse with NS, gently pat dry, apply Xeroform gauze, top with dry gauze,change every shift. Pt is already on a low air loss mattress  Morbid obesity / Debility  - Nutrition consulted - Body mass index is 54.58 kg/m.  Code Status: full  DVT Prophylaxis:  heparin  Family Communication: Discussed in detail with the patient, all imaging results, lab results explained to the patient.    Disposition Plan: When cleared by nephrology  Time Spent in minutes 25 minutes  Procedures:  Hemodialysis is initiated 3/28  Consultants:    Nephrology  WOC  Palliative care   Nutrition  Antimicrobials:    Doxycycline 3/21->   Medications  Scheduled Meds: . amLODipine  10 mg Oral Daily  . atorvastatin  20 mg Oral Daily  . calcium acetate  667 mg Oral TID WC  . carvedilol  25 mg Oral BID WC  . darbepoetin (ARANESP) injection - NON-DIALYSIS  150 mcg Subcutaneous Q Thu-1800  . doxycycline  100 mg Oral Q12H  . feeding supplement (NEPRO CARB STEADY)  237 mL Oral TID BM  . ferric gluconate (FERRLECIT/NULECIT) IV  125 mg Intravenous Q T,Th,Sa-HD  . heparin  5,000 Units Subcutaneous Q8H  . hydrALAZINE  100 mg Oral TID  . insulin aspart  0-5 Units Subcutaneous QHS  . insulin aspart  0-9 Units Subcutaneous TID WC  . isosorbide mononitrate  30 mg Oral Daily  . multivitamin  1 tablet Oral QHS  . polyethylene glycol  17 g Oral Daily  . potassium chloride  40 mEq Oral Daily  . senna-docusate  1 tablet Oral BID   Continuous Infusions: . sodium chloride     PRN Meds:.acetaminophen, albuterol, camphor-menthol, guaiFENesin-dextromethorphan, HYDROcodone-acetaminophen, morphine injection, nitroGLYCERIN, ondansetron **OR** ondansetron (ZOFRAN) IV   Antibiotics   Anti-infectives    Start     Dose/Rate Route Frequency Ordered Stop   05/08/16 1000  vancomycin (VANCOCIN) IVPB 1000 mg/200 mL premix     1,000 mg 200 mL/hr over 60 Minutes Intravenous To Surgery 05/07/16 1904 05/08/16 1004   05/08/16 0902  vancomycin (VANCOCIN) 1-5 GM/200ML-% IVPB    Comments:  Matilde Haymaker, [name]   : cabinet override      05/08/16 0902 05/08/16 0904   05/07/16 1300  ceFAZolin (ANCEF) 3 g in dextrose 5 % 50 mL IVPB     3 g 130 mL/hr over 30 Minutes Intravenous To Radiology 05/07/16 1232 05/07/16 1330   05/02/16 2200  doxycycline (VIBRA-TABS) tablet 100 mg     100 mg Oral Every 12 hours 05/02/16 1732     05/01/16 2200  piperacillin-tazobactam (ZOSYN) IVPB 2.25 g  Status:  Discontinued     2.25 g 100 mL/hr over 30 Minutes Intravenous Every 8 hours 05/01/16 1324 05/02/16 1729   05/01/16 1330  vancomycin (VANCOCIN) 2,500  mg in sodium chloride 0.9 % 500 mL IVPB     2,500 mg 250 mL/hr over 120 Minutes Intravenous  Once 05/01/16 1309 05/01/16 1551   05/01/16 1300  piperacillin-tazobactam (ZOSYN) IVPB 3.375 g     3.375 g 100 mL/hr over 30 Minutes Intravenous  Once 05/01/16 1258 05/01/16 1340   05/01/16 1300  vancomycin (VANCOCIN) IVPB 1000 mg/200 mL premix  Status:  Discontinued     1,000 mg 200 mL/hr over 60 Minutes Intravenous  Once 05/01/16 1258 05/01/16 1308        Subjective:   [name] was seen and examined  today in hemodialysis complaining of not feeling well, nauseous, not able to breathe, very demotivated today. Placed on O2, sats 97%. Patient denies dizziness, shortness of breath, abdominal pain, N/V/D/C, new weakness, numbess, tingling.   Objective:   Vitals:   05/11/16 1000 05/11/16 1030 05/11/16 1036 05/11/16 1124  BP: (!) 133/47 (!) 143/41 (!) 144/80 (!) 174/98  Pulse: (!) 106 (!) 105 (!) 104 (!) 108  Resp: (!) 22 (!) Temp:   98.7 F (37.1 C)   TempSrc:   Oral   SpO2:   96% 93%  Weight:   136 kg (299 lb 13.2 oz)   Height:        Intake/Output Summary (Last 24 hours) at 05/11/16 1144 Last data filed at 05/11/16 1125  Gross per 24 hour  Intake                0 ml  Output             4650 ml  Net            -4650 ml     Wt Readings from Last 3 Encounters:  05/11/16 136 kg (299 lb 13.2 oz)  03/29/16 (!) 167 kg (368 lb 3.2 oz)  03/09/16 (!) 168.3 kg (371 lb)     Exam  General: Alert and oriented 3 NAD  HEENT:  Neck: Supple,   Cardiovascular: S1 S2 clear, RRR  Respiratory: CTAB  Gastrointestinal: Morbidly obese, soft, NT, ND  Ext: no cyanosis clubbing or edema,  Neuro: no new deficits  Skin: No rashes  Psych: Normal affect and demeanor, alert and oriented x3    Data Reviewed:  I have personally reviewed following labs and imaging studies  Micro Results Recent Results (from the past 240 hour(s))  Blood Culture (routine x 2)      Status: Abnormal   Collection Time: 05/01/16 12:20 PM  Result Value Ref Range Status   Specimen Description BLOOD LEFT ANTECUBITAL  Final   Special Requests BOTTLES DRAWN AEROBIC AND ANAEROBIC 5CC  Final   Culture  Setup Time   Final    GRAM POSITIVE COCCI IN CLUSTERS AEROBIC BOTTLE ONLY CRITICAL RESULT CALLED TO, READ BACK BY AND VERIFIED WITH: T GREEN PHARMD [id number] 05/02/16 A BROWNING    Culture (A)  Final    STAPHYLOCOCCUS SPECIES (COAGULASE NEGATIVE) THE SIGNIFICANCE OF ISOLATING THIS ORGANISM FROM A SINGLE SET OF BLOOD CULTURES WHEN MULTIPLE SETS ARE DRAWN IS UNCERTAIN. PLEASE NOTIFY THE MICROBIOLOGY DEPARTMENT WITHIN ONE WEEK IF SPECIATION AND SENSITIVITIES ARE REQUIRED. Performed at [facility], [address], [place], [place] [address]    Report Status 05/04/2016 FINAL  Final  Blood Culture ID Panel (Reflexed)     Status: None   Collection Time: 05/01/16 12:20 PM  Result Value Ref Range Status   Enterococcus species NOT DETECTED NOT DETECTED Final   Listeria monocytogenes NOT DETECTED NOT DETECTED Final   Staphylococcus species NOT DETECTED NOT DETECTED Final   Staphylococcus aureus NOT DETECTED NOT DETECTED Final   Streptococcus species NOT DETECTED NOT DETECTED Final   Streptococcus agalactiae NOT DETECTED NOT DETECTED Final   Streptococcus pneumoniae NOT DETECTED NOT DETECTED Final   Streptococcus pyogenes NOT DETECTED NOT DETECTED Final   Acinetobacter baumannii NOT DETECTED NOT DETECTED Final   Enterobacteriaceae species NOT DETECTED NOT DETECTED Final   Enterobacter cloacae complex NOT DETECTED NOT DETECTED Final   Escherichia coli NOT DETECTED NOT DETECTED Final   Klebsiella oxytoca NOT  DETECTED NOT DETECTED Final   Klebsiella pneumoniae NOT DETECTED NOT DETECTED Final   Proteus species NOT DETECTED NOT DETECTED Final   Serratia marcescens NOT DETECTED NOT DETECTED Final   Haemophilus influenzae NOT DETECTED NOT DETECTED Final   Neisseria meningitidis NOT  DETECTED NOT DETECTED Final   Pseudomonas aeruginosa NOT DETECTED NOT DETECTED Final   Candida albicans NOT DETECTED NOT DETECTED Final   Candida glabrata NOT DETECTED NOT DETECTED Final   Candida krusei NOT DETECTED NOT DETECTED Final   Candida parapsilosis NOT DETECTED NOT DETECTED Final   Candida tropicalis NOT DETECTED NOT DETECTED Final    Comment: Performed at [facility], [address] [place], [place] [address]  Blood Culture (routine x 2)     Status: None   Collection Time: 05/01/16 12:26 PM  Result Value Ref Range Status   Specimen Description BLOOD LEFT HAND  Final   Special Requests BOTTLES DRAWN AEROBIC AND ANAEROBIC 5CC  Final   Culture   Final    NO GROWTH 5 DAYS Performed at [facility], [address], [place], [place] [address]    Report Status 05/06/2016 FINAL  Final    Radiology Reports Dg Chest 2 View  Result Date: 05/01/2016 CLINICAL DATA:  Fever and weakness. EXAM: CHEST  2 VIEW COMPARISON:  03/22/2016. FINDINGS: Cardiomegaly with mild bilateral interstitial prominence. No pleural effusion or pneumothorax. No acute bony abnormality. IMPRESSION: Cardiomegaly with mild bilateral interstitial prominence consistent with CHF. Findings have improved from prior study of 03/22/2016. Given patient's history of fever pneumonitis cannot be excluded. Electronically Signed   By: Maisie Fus  Register   On: 05/01/2016 13:30   Ct Chest Wo Contrast  Result Date: 05/02/2016 CLINICAL DATA:  Morbid obesity week chronic kidney disease and diabetes. Complains of weakness and fever over the past week. Cough. EXAM: CT CHEST WITHOUT CONTRAST TECHNIQUE: Multidetector CT imaging of the chest was performed following the standard protocol without IV contrast. COMPARISON:  Chest x-ray 05/01/2016 FINDINGS: Cardiovascular: Mild to moderate cardiomegaly. Remaining vascular structures are unremarkable. Mediastinum/Nodes: No definite mediastinal or hilar adenopathy. Remaining mediastinal  structures are unremarkable. Lungs/Pleura: Lungs are somewhat hypoinflated and demonstrate mild bibasilar linear atelectasis. Linear density over the posterior and anterior right middle lobe likely atelectasis. No evidence of effusion. Airways are unremarkable. Upper Abdomen: Mild calcified plaque over the celiac axis, splenic artery and superior mesenteric arteries. Musculoskeletal: Minimal degenerative change of the spine. Prominent overlying soft tissues. IMPRESSION: Linear density over the right middle lobe likely atelectasis, although cannot completely exclude infection. Minimal linear bibasilar atelectasis. Moderate cardiomegaly. Electronically Signed   By: Elberta Fortis M.D.   On: 05/02/2016 15:53   Nm Pulmonary Perf And Vent  Result Date: 05/06/2016 CLINICAL DATA:  Chest pain EXAM: NUCLEAR MEDICINE VENTILATION - PERFUSION LUNG SCAN VIEWS: Anterior, posterior, RAO, LAO, RPO, LPO-ventilation and profusion. RADIOPHARMACEUTICALS:  32.6 mCi Technetium-77m DTPA aerosol inhalation and 4.2 mCi Technetium-72m MAA IV COMPARISON:  Chest radiograph May 06, 2016 FINDINGS: Ventilation: Radiotracer uptake is homogeneous and symmetric. No ventilation defects are evident. Perfusion: Radiotracer uptake is homogeneous and symmetric bilaterally. No perfusion defects evident. Cardiomegaly noted. IMPRESSION: No appreciable ventilation or perfusion defects. This study constitutes a very low probability of pulmonary embolus. Cardiomegaly noted. Electronically Signed   By: Bretta Bang III M.D.   On: 05/06/2016 14:19   Ir Fluoro Guide Cv Line Right  Result Date: 05/07/2016 INDICATION: End-stage renal disease and starting hemodialysis. EXAM: FLUOROSCOPIC AND ULTRASOUND GUIDED PLACEMENT OF A TUNNELED  DIALYSIS CATHETER Physician: Rachelle Hora. Lowella Dandy, MD MEDICATIONS: Ancef 3 g; The antibiotic was administered within an appropriate time interval prior to skin puncture. ANESTHESIA/SEDATION: Versed 2.0 mg IV; Fentanyl 100 mcg IV;  Moderate Sedation Time:  20 minutes The patient was continuously monitored during the procedure by the interventional radiology nurse under my direct supervision. FLUOROSCOPY TIME:  Fluoroscopy Time: 24 seconds, 2.5 mGy COMPLICATIONS: None immediate. PROCEDURE: Informed consent was obtained for placement of a tunneled dialysis catheter. The patient was placed supine on the interventional table. Ultrasound confirmed a patent right internal jugular vein. Ultrasound images were obtained for documentation. The right side of the neck was prepped and draped in a sterile fashion. The right side of the neck was anesthetized with 1% lidocaine. Maximal barrier sterile technique was utilized including caps, mask, sterile gowns, sterile gloves, sterile drape, hand hygiene and skin antiseptic. A small incision was made with #11 blade scalpel. A 21 gauge needle directed into the right internal jugular vein with ultrasound guidance. A micropuncture dilator set was placed. A 23 cm tip to cuff Palindrome catheter was selected. The skin below the right clavicle was anesthetized and a small incision was made with an #11 blade scalpel. A subcutaneous tunnel was formed to the vein dermatotomy site. The catheter was brought through the tunnel. The vein dermatotomy site was dilated to accommodate a peel-away sheath. The catheter was placed through the peel-away sheath and directed into the central venous structures. The tip of the catheter was placed at the superior cavoatrial junction with fluoroscopy. Fluoroscopic images were obtained for documentation. Both lumens were found to aspirate and flush well. The proper amount of heparin was flushed in both lumens. The vein dermatotomy site was closed using a single layer of absorbable suture and Dermabond. Gel-Foam placed in the subcutaneous tract. The catheter was secured to the skin using Prolene suture. IMPRESSION: Successful placement of a right jugular tunneled dialysis catheter using  ultrasound and fluoroscopic guidance. Electronically Signed   By: Richarda Overlie M.D.   On: 05/07/2016 14:33   Ir US Guide Vasc Access Right  Result Date: 05/07/2016 INDICATION: End-stage renal disease and starting hemodialysis. EXAM: FLUOROSCOPIC AND ULTRASOUND GUIDED PLACEMENT OF A TUNNELED DIALYSIS CATHETER Physician: Rachelle Hora. Lowella Dandy, MD MEDICATIONS: Ancef 3 g; The antibiotic was administered within an appropriate time interval prior to skin puncture. ANESTHESIA/SEDATION: Versed 2.0 mg IV; Fentanyl 100 mcg IV; Moderate Sedation Time:  20 minutes The patient was continuously monitored during the procedure by the interventional radiology nurse under my direct supervision. FLUOROSCOPY TIME:  Fluoroscopy Time: 24 seconds, 2.5 mGy COMPLICATIONS: None immediate. PROCEDURE: Informed consent was obtained for placement of a tunneled dialysis catheter. The patient was placed supine on the interventional table. Ultrasound confirmed a patent right internal jugular vein. Ultrasound images were obtained for documentation. The right side of the neck was prepped and draped in a sterile fashion. The right side of the neck was anesthetized with 1% lidocaine. Maximal barrier sterile technique was utilized including caps, mask, sterile gowns, sterile gloves, sterile drape, hand hygiene and skin antiseptic. A small incision was made with #11 blade scalpel. A 21 gauge needle directed into the right internal jugular vein with ultrasound guidance. A micropuncture dilator set was placed. A 23 cm tip to cuff Palindrome catheter was selected. The skin below the right clavicle was anesthetized and a small incision was made with an #11 blade scalpel. A subcutaneous tunnel was formed to the vein dermatotomy site. The catheter was brought through the  tunnel. The vein dermatotomy site was dilated to accommodate a peel-away sheath. The catheter was placed through the peel-away sheath and directed into the central venous structures. The tip of the  catheter was placed at the superior cavoatrial junction with fluoroscopy. Fluoroscopic images were obtained for documentation. Both lumens were found to aspirate and flush well. The proper amount of heparin was flushed in both lumens. The vein dermatotomy site was closed using a single layer of absorbable suture and Dermabond. Gel-Foam placed in the subcutaneous tract. The catheter was secured to the skin using Prolene suture. IMPRESSION: Successful placement of a right jugular tunneled dialysis catheter using ultrasound and fluoroscopic guidance. Electronically Signed   By: Richarda Overlie M.D.   On: 05/07/2016 14:33   Dg Chest Port 1 View  Result Date: 05/06/2016 CLINICAL DATA:  Acute onset of generalized chest pain. Initial encounter. EXAM: PORTABLE CHEST 1 VIEW COMPARISON:  Chest radiograph performed 05/01/2016, and CT of the chest performed 05/02/2016 FINDINGS: The lungs are hypoexpanded. Vascular congestion and vascular crowding are noted. Increased interstitial markings may reflect mild interstitial edema. There is no evidence of pleural effusion or pneumothorax. The cardiomediastinal silhouette is mildly enlarged. No acute osseous abnormalities are seen. IMPRESSION: Lungs hypoexpanded. Vascular congestion and mild cardiomegaly. Increased interstitial markings could reflect mild interstitial edema. Electronically Signed   By: Roanna Raider M.D.   On: 05/06/2016 02:48    Lab Data:  CBC:  Recent Labs Lab 05/05/16 0543 05/07/16 0737 05/08/16 0720 05/09/16 1030 05/11/16 0746  WBC 11.9* 10.7* 11.3* 12.2* 17.2*  HGB 8.6* 9.5* 8.7* 8.4* 8.4*  HCT 26.7* 28.8* 27.2* 27.4* 27.8*  MCV 85.9 87.5 87.2 88.7 90.0  PLT 511* 456* 423* 339 299   Basic Metabolic Panel:  Recent Labs Lab 05/05/16 0543 05/06/16 0213 05/07/16 0737 05/08/16 0720 05/09/16 1030 05/11/16 0747  NA 142  142 142 143 142 142 146*  K 3.3*  3.3* 3.4* 3.7 3.7 3.9 4.3  CL 95*  95* 97* 98* 98* 100* 104  CO2 GLUCOSE 163*  170* 173* 169* 169* 155* 182*  BUN 131*  132* 130* 129* 131* 94* 74*  CREATININE 7.18*  7.22* 7.01* 6.66* 6.59* 5.64* 5.07*  CALCIUM 8.9  8.8* 8.7* 8.8* 8.7* 8.8* 9.5  PHOS 7.6* 7.2* 6.6*  --  5.1* 4.3   GFR: Estimated Creatinine Clearance: 21.2 mL/min (A) (by C-G formula based on SCr of 5.07 mg/dL (H)). Liver Function Tests:  Recent Labs Lab 05/05/16 0543 05/06/16 0213 05/07/16 0737 05/09/16 1030 05/11/16 0747  ALBUMIN 2.0* 2.1* 2.1* 2.0* 2.1*   No results for input(s): LIPASE, AMYLASE in the last 168 hours. No results for input(s): AMMONIA in the last 168 hours. Coagulation Profile:  Recent Labs Lab 05/07/16 0737 05/08/16 0720  INR 1.70 1.90   Cardiac Enzymes:  Recent Labs Lab 05/06/16 0213  TROPONINI 0.04*   BNP (last 3 results) No results for input(s): PROBNP in the last 8760 hours. HbA1C: No results for input(s): HGBA1C in the last 72 hours. CBG:  Recent Labs Lab 05/09/16 2134 05/10/16 0802 05/10/16 1156 05/10/16 1651 05/11/16 1121  GLUCAP 154* 164* 163* 174* 151*   Lipid Profile: No results for input(s): CHOL, HDL, LDLCALC, TRIG, CHOLHDL, LDLDIRECT in the last 72 hours. Thyroid Function Tests: No results for input(s): TSH, T4TOTAL, FREET4, T3FREE, THYROIDAB in the last 72 hours. Anemia Panel: No results for input(s): VITAMINB12, FOLATE, FERRITIN, TIBC, IRON, RETICCTPCT in the last 72  hours. Urine analysis:    Component Value Date/Time   COLORURINE STRAW (A) 05/01/2016 1144   APPEARANCEUR CLEAR 05/01/2016 1144   LABSPEC 1.009 05/01/2016 1144   PHURINE 5.0 05/01/2016 1144   GLUCOSEU NEGATIVE 05/01/2016 1144   HGBUR SMALL (A) 05/01/2016 1144   BILIRUBINUR NEGATIVE 05/01/2016 1144   KETONESUR NEGATIVE 05/01/2016 1144   PROTEINUR 30 (A) 05/01/2016 1144   UROBILINOGEN 0.2 10/26/2013 1721   NITRITE NEGATIVE 05/01/2016 1144   LEUKOCYTESUR NEGATIVE 05/01/2016 1144     Denelda Akerley M.D. [facility] 05/11/2016,  11:44 AM  Pager: [phone] Between 7am to 7pm - call Pager - [phone]  After 7pm go to [email or web address] - password [email or web address]  Call night coverage person covering after 7pm

## 2016-05-12 LAB — RENAL FUNCTION PANEL
ALBUMIN: 2.1 g/dL — AB (ref 3.5–5.0)
ANION GAP: 11 (ref 5–15)
BUN: 54 mg/dL — ABNORMAL HIGH (ref 6–20)
CO2: 28 mmol/L (ref 22–32)
Calcium: 9.4 mg/dL (ref 8.9–10.3)
Chloride: 102 mmol/L (ref 101–111)
Creatinine, Ser: 4.11 mg/dL — ABNORMAL HIGH (ref 0.44–1.00)
GFR calc Af Amer: 15 mL/min — ABNORMAL LOW (ref >60)
GFR calc non Af Amer: 13 mL/min — ABNORMAL LOW (ref >60)
GLUCOSE: 224 mg/dL — AB (ref 65–99)
PHOSPHORUS: 3.5 mg/dL (ref 2.5–4.6)
POTASSIUM: 5.5 mmol/L — AB (ref 3.5–5.1)
Sodium: 141 mmol/L (ref 135–145)

## 2016-05-12 LAB — GLUCOSE, CAPILLARY
GLUCOSE-CAPILLARY: 213 mg/dL — AB (ref 65–99)
Glucose-Capillary: 216 mg/dL — ABNORMAL HIGH (ref 65–99)
Glucose-Capillary: 225 mg/dL — ABNORMAL HIGH (ref 65–99)
Glucose-Capillary: 220 mg/dL — ABNORMAL HIGH (ref 65–99)

## 2016-05-12 MED ORDER — SODIUM CHLORIDE 0.9 % IV SOLN
125.0000 mg | INTRAVENOUS | Status: AC
  Administered 2016-05-13 – 2016-05-24 (×6): 125 mg via INTRAVENOUS
  Filled 2016-05-12 (×12): qty 10

## 2016-05-12 MED ORDER — ALBUTEROL SULFATE (2.5 MG/3ML) 0.083% IN NEBU
3.0000 mL | INHALATION_SOLUTION | RESPIRATORY_TRACT | Status: DC
  Administered 2016-05-15 – 2016-05-29 (×5): 3 mL via RESPIRATORY_TRACT
  Filled 2016-05-12 (×6): qty 3

## 2016-05-12 MED ORDER — INSULIN GLARGINE 100 UNIT/ML ~~LOC~~ SOLN
5.0000 [IU] | Freq: Every day | SUBCUTANEOUS | Status: DC
  Administered 2016-05-12 – 2016-05-16 (×5): 5 [IU] via SUBCUTANEOUS
  Filled 2016-05-12 (×7): qty 0.05

## 2016-05-12 MED ORDER — DEXTROSE 5 % IV SOLN
1.0000 g | INTRAVENOUS | Status: DC
  Administered 2016-05-12 – 2016-05-13 (×2): 1 g via INTRAVENOUS
  Filled 2016-05-12 (×3): qty 1

## 2016-05-12 MED ORDER — DARBEPOETIN ALFA 150 MCG/0.3ML IJ SOSY
150.0000 ug | PREFILLED_SYRINGE | INTRAMUSCULAR | Status: DC
  Administered 2016-05-17 – 2016-05-31 (×3): 150 ug via INTRAVENOUS
  Filled 2016-05-12 (×3): qty 0.3

## 2016-05-12 NOTE — Progress Notes (Signed)
[facility]                                                                              Patient Demographics  [name], is a 41 y.o. female, DOB - April 07, 1975, ZOX:[id number]  Admit date - 05/01/2016   Admitting Physician Costin Otelia Sergeant, MD  Outpatient Primary MD for the patient is Marletta Lor, NP  Outpatient specialists:   LOS - 11  days    Chief Complaint  Patient presents with  . wounds       Brief summary  41 y.o.femalewith medical history significant for morbid obesity, chronic kidney disease stage IV-V, diabetes mellitus, hypertension, prior CVA, diastolic CHF who presented to ED with progressive weakness, fever for past 1 week prior to this admission. She is actually bed bound for past year or so per her husband report ever since stroke. She also had dry cough and shortness of breath for past 1 week. She was hemodynamically stable on admission. Her creatinine was 7.9 (much worse since her recent Cr values of 4-5). Renal has seen her in consultation. Due to her debility she is not a good candidate for HD. Palliative care was consulted as well.      Assessment & Plan   Acute metabolic encephalopathy - Improving, at baseline mental status - Likely multifactorial from history of CVA, end stage renal disease, possible uremia, acute infectious process - possible pneumonia  Chest pain episode  -  Troponin 1 was 0.04 however d-dimer was 2.01 - VQ scan Negative for any PE no further chest pain episodes  Acute renal failure superimposed on CKD stage 5 not on HD / Acute pulmonary edema  - Due to progressive disease from uncontrolled diabetes and FSG. Patient was placed on aggressive IV Lasix and metolazone per renal recommendations. - Tunneled dialysis cath Placed on 3/27. Hemodialysis initiated 3/28, Second HD on 3/29 - Vascular surgery consulted, patient refused with graft placement - Accepted at [facility] [address] .Start  Date and Time 1st treatment Wednesday 05/15/16 at 12:00 pm, tentative schedule MWF  - Vascular surgery also following, recommended patient to have fistula surgery while inpatient. Patient appears less motivated for hemodialysis  Acute respiratory failure with hypoxia / Right middle lobe pneumonia / Leukocytosis  - CT chest without contrast showed linear density over the right middle lobe likely atelectasis, although cannot completely exclude infection - She is on empiric doxycycline  - Blood cultures 1/2 showed coagnegative staph likely contaminant  - Spiking low-grade fevers, on doxycycline, leukocytosis trending up, WBC count 17.2, add fortaz IV per pharmacy   Anemia of chronic disease / IDA - Due to CKD - Status post 2 units PRBC transfusion on 05/02/2016  - Aranesp 3/22, continue iron supplementation, B12  Uncontrolled diabetes mellitus with diabetic nephropathy with long term insulin use - Continue current insulin regimen: SSI - CBGs elevated, add 5 units Lantus at bedtime  Dyslipidemia associated with type 2 DM - Continue statin therapy   Essential hypertension - Continue Norvasc, coreg, hydralazine, Imdur   Bilateral inner thigh wounds - Related to moisture and friction from thighs rubbing together - Left  inner thigh has large area with many open areas scattered throughout, 17cm x 12cm x 0.1cm  - Right inner thigh wound is 8cm x 5cm x 0.3cm  - Dressing procedure/placement/frequency: Cleanse with NS, gently pat dry, apply Xeroform gauze, top with dry gauze,change every shift. Pt is already on a low air loss mattress - Spiking low-grade fevers, on doxycycline, leukocytosis trending up, WBC count 17.2, add fortaz IV per pharmacy, follow CBC closely  Morbid obesity / Debility  - Nutrition consulted - Body mass index is 54.58 kg/m.  Code Status: full  DVT Prophylaxis:  heparin  Family Communication: Discussed in detail with the patient, all imaging results, lab results  explained to the patient. Family member in the room.   Disposition Plan: When cleared by nephrology  Time Spent in minutes 25 minutes  Procedures:  Hemodialysis is initiated 3/28  Consultants:    Nephrology  WOC  Palliative care   Nutrition  Antimicrobials:  Doxycycline 3/21->   Medications  Scheduled Meds: . amLODipine  10 mg Oral Daily  . atorvastatin  20 mg Oral Daily  . calcium acetate  667 mg Oral TID WC  . carvedilol  25 mg Oral BID WC  . darbepoetin (ARANESP) injection - NON-DIALYSIS  150 mcg Subcutaneous Q Thu-1800  . doxycycline  100 mg Oral Q12H  . feeding supplement (NEPRO CARB STEADY)  237 mL Oral TID BM  . ferric gluconate (FERRLECIT/NULECIT) IV  125 mg Intravenous Q T,Th,Sa-HD  . heparin  5,000 Units Subcutaneous Q8H  . hydrALAZINE  100 mg Oral TID  . insulin aspart  0-5 Units Subcutaneous QHS  . insulin aspart  0-9 Units Subcutaneous TID WC  . isosorbide mononitrate  30 mg Oral Daily  . multivitamin  1 tablet Oral QHS  . polyethylene glycol  17 g Oral Daily  . potassium chloride  40 mEq Oral Daily  . senna-docusate  1 tablet Oral BID   Continuous Infusions: . sodium chloride     PRN Meds:.acetaminophen, albuterol, camphor-menthol, guaiFENesin-dextromethorphan, HYDROcodone-acetaminophen, morphine injection, nitroGLYCERIN, ondansetron **OR** ondansetron (ZOFRAN) IV   Antibiotics   Anti-infectives    Start     Dose/Rate Route Frequency Ordered Stop   05/08/16 1000  vancomycin (VANCOCIN) IVPB 1000 mg/200 mL premix     1,000 mg 200 mL/hr over 60 Minutes Intravenous To Surgery 05/07/16 1904 05/08/16 1004   05/08/16 0902  vancomycin (VANCOCIN) 1-5 GM/200ML-% IVPB    Comments:  Matilde Haymaker, [name]   : cabinet override      05/08/16 0902 05/08/16 0904   05/07/16 1300  ceFAZolin (ANCEF) 3 g in dextrose 5 % 50 mL IVPB     3 g 130 mL/hr over 30 Minutes Intravenous To Radiology 05/07/16 1232 05/07/16 1330   05/02/16 2200  doxycycline (VIBRA-TABS) tablet  100 mg     100 mg Oral Every 12 hours 05/02/16 1732     05/01/16 2200  piperacillin-tazobactam (ZOSYN) IVPB 2.25 g  Status:  Discontinued     2.25 g 100 mL/hr over 30 Minutes Intravenous Every 8 hours 05/01/16 1324 05/02/16 1729   05/01/16 1330  vancomycin (VANCOCIN) 2,500 mg in sodium chloride 0.9 % 500 mL IVPB     2,500 mg 250 mL/hr over 120 Minutes Intravenous  Once 05/01/16 1309 05/01/16 1551   05/01/16 1300  piperacillin-tazobactam (ZOSYN) IVPB 3.375 g     3.375 g 100 mL/hr over 30 Minutes Intravenous  Once 05/01/16 1258 05/01/16 1340   05/01/16 1300  vancomycin (VANCOCIN) IVPB 1000 mg/200  mL premix  Status:  Discontinued     1,000 mg 200 mL/hr over 60 Minutes Intravenous  Once 05/01/16 1258 05/01/16 1308        Subjective:   [name] was seen and examined today. Feeling claustrophobic in the room, spiking low-grade fevers. Patient denies dizziness, shortness of breath, abdominal pain, N/V/D/C, new weakness, numbess, tingling.   Objective:   Vitals:   05/11/16 1847 05/11/16 2305 05/12/16 0620 05/12/16 0844  BP: (!) 117/59 (!) 142/72 (!) 167/81 139/72  Pulse: (!) 106 100 (!) 101 (!) 101  Resp: 18 18 18 16   Temp: 100.3 F (37.9 C) 100 F (37.8 C) (!) 100.7 F (38.2 C) (!) 100.4 F (38 C)  TempSrc: Oral Oral Oral Oral  SpO2: 96% 96% 97% 98%  Weight:  134 kg (295 lb 6.7 oz)    Height:        Intake/Output Summary (Last 24 hours) at 05/12/16 1238 Last data filed at 05/12/16 1059  Gross per 24 hour  Intake              480 ml  Output              726 ml  Net             -246 ml     Wt Readings from Last 3 Encounters:  05/11/16 134 kg (295 lb 6.7 oz)  03/29/16 (!) 167 kg (368 lb 3.2 oz)  03/09/16 (!) 168.3 kg (371 lb)     Exam  General: Alert and oriented 3 NAD  HEENT:  Neck: Supple,   Cardiovascular: S1 S2 clear, RRR  Respiratory: CTAB  Gastrointestinal: Morbidly obese, soft, NT, ND  Ext: no cyanosis clubbing or edema,  Neuro: no  new deficits  Skin: No rashes  Psych: Normal affect and demeanor, alert and oriented x3    Data Reviewed:  I have personally reviewed following labs and imaging studies  Micro Results No results found for this or any previous visit (from the past 240 hour(s)).  Radiology Reports Dg Chest 2 View  Result Date: 05/01/2016 CLINICAL DATA:  Fever and weakness. EXAM: CHEST  2 VIEW COMPARISON:  03/22/2016. FINDINGS: Cardiomegaly with mild bilateral interstitial prominence. No pleural effusion or pneumothorax. No acute bony abnormality. IMPRESSION: Cardiomegaly with mild bilateral interstitial prominence consistent with CHF. Findings have improved from prior study of 03/22/2016. Given patient's history of fever pneumonitis cannot be excluded. Electronically Signed   By: Maisie Fus  Register   On: 05/01/2016 13:30   Ct Chest Wo Contrast  Result Date: 05/02/2016 CLINICAL DATA:  Morbid obesity week chronic kidney disease and diabetes. Complains of weakness and fever over the past week. Cough. EXAM: CT CHEST WITHOUT CONTRAST TECHNIQUE: Multidetector CT imaging of the chest was performed following the standard protocol without IV contrast. COMPARISON:  Chest x-ray 05/01/2016 FINDINGS: Cardiovascular: Mild to moderate cardiomegaly. Remaining vascular structures are unremarkable. Mediastinum/Nodes: No definite mediastinal or hilar adenopathy. Remaining mediastinal structures are unremarkable. Lungs/Pleura: Lungs are somewhat hypoinflated and demonstrate mild bibasilar linear atelectasis. Linear density over the posterior and anterior right middle lobe likely atelectasis. No evidence of effusion. Airways are unremarkable. Upper Abdomen: Mild calcified plaque over the celiac axis, splenic artery and superior mesenteric arteries. Musculoskeletal: Minimal degenerative change of the spine. Prominent overlying soft tissues. IMPRESSION: Linear density over the right middle lobe likely atelectasis, although cannot completely  exclude infection. Minimal linear bibasilar atelectasis. Moderate cardiomegaly. Electronically Signed   By: Elberta Fortis M.D.  On: 05/02/2016 15:53   Nm Pulmonary Perf And Vent  Result Date: 05/06/2016 CLINICAL DATA:  Chest pain EXAM: NUCLEAR MEDICINE VENTILATION - PERFUSION LUNG SCAN VIEWS: Anterior, posterior, RAO, LAO, RPO, LPO-ventilation and profusion. RADIOPHARMACEUTICALS:  32.6 mCi Technetium-48m DTPA aerosol inhalation and 4.2 mCi Technetium-82m MAA IV COMPARISON:  Chest radiograph May 06, 2016 FINDINGS: Ventilation: Radiotracer uptake is homogeneous and symmetric. No ventilation defects are evident. Perfusion: Radiotracer uptake is homogeneous and symmetric bilaterally. No perfusion defects evident. Cardiomegaly noted. IMPRESSION: No appreciable ventilation or perfusion defects. This study constitutes a very low probability of pulmonary embolus. Cardiomegaly noted. Electronically Signed   By: Bretta Bang III M.D.   On: 05/06/2016 14:19   Ir Fluoro Guide Cv Line Right  Result Date: 05/07/2016 INDICATION: End-stage renal disease and starting hemodialysis. EXAM: FLUOROSCOPIC AND ULTRASOUND GUIDED PLACEMENT OF A TUNNELED DIALYSIS CATHETER Physician: Rachelle Hora. Lowella Dandy, MD MEDICATIONS: Ancef 3 g; The antibiotic was administered within an appropriate time interval prior to skin puncture. ANESTHESIA/SEDATION: Versed 2.0 mg IV; Fentanyl 100 mcg IV; Moderate Sedation Time:  20 minutes The patient was continuously monitored during the procedure by the interventional radiology nurse under my direct supervision. FLUOROSCOPY TIME:  Fluoroscopy Time: 24 seconds, 2.5 mGy COMPLICATIONS: None immediate. PROCEDURE: Informed consent was obtained for placement of a tunneled dialysis catheter. The patient was placed supine on the interventional table. Ultrasound confirmed a patent right internal jugular vein. Ultrasound images were obtained for documentation. The right side of the neck was prepped and draped in a  sterile fashion. The right side of the neck was anesthetized with 1% lidocaine. Maximal barrier sterile technique was utilized including caps, mask, sterile gowns, sterile gloves, sterile drape, hand hygiene and skin antiseptic. A small incision was made with #11 blade scalpel. A 21 gauge needle directed into the right internal jugular vein with ultrasound guidance. A micropuncture dilator set was placed. A 23 cm tip to cuff Palindrome catheter was selected. The skin below the right clavicle was anesthetized and a small incision was made with an #11 blade scalpel. A subcutaneous tunnel was formed to the vein dermatotomy site. The catheter was brought through the tunnel. The vein dermatotomy site was dilated to accommodate a peel-away sheath. The catheter was placed through the peel-away sheath and directed into the central venous structures. The tip of the catheter was placed at the superior cavoatrial junction with fluoroscopy. Fluoroscopic images were obtained for documentation. Both lumens were found to aspirate and flush well. The proper amount of heparin was flushed in both lumens. The vein dermatotomy site was closed using a single layer of absorbable suture and Dermabond. Gel-Foam placed in the subcutaneous tract. The catheter was secured to the skin using Prolene suture. IMPRESSION: Successful placement of a right jugular tunneled dialysis catheter using ultrasound and fluoroscopic guidance. Electronically Signed   By: Richarda Overlie M.D.   On: 05/07/2016 14:33   Ir US Guide Vasc Access Right  Result Date: 05/07/2016 INDICATION: End-stage renal disease and starting hemodialysis. EXAM: FLUOROSCOPIC AND ULTRASOUND GUIDED PLACEMENT OF A TUNNELED DIALYSIS CATHETER Physician: Rachelle Hora. Lowella Dandy, MD MEDICATIONS: Ancef 3 g; The antibiotic was administered within an appropriate time interval prior to skin puncture. ANESTHESIA/SEDATION: Versed 2.0 mg IV; Fentanyl 100 mcg IV; Moderate Sedation Time:  20 minutes The patient  was continuously monitored during the procedure by the interventional radiology nurse under my direct supervision. FLUOROSCOPY TIME:  Fluoroscopy Time: 24 seconds, 2.5 mGy COMPLICATIONS: None immediate. PROCEDURE: Informed consent was obtained for placement of  a tunneled dialysis catheter. The patient was placed supine on the interventional table. Ultrasound confirmed a patent right internal jugular vein. Ultrasound images were obtained for documentation. The right side of the neck was prepped and draped in a sterile fashion. The right side of the neck was anesthetized with 1% lidocaine. Maximal barrier sterile technique was utilized including caps, mask, sterile gowns, sterile gloves, sterile drape, hand hygiene and skin antiseptic. A small incision was made with #11 blade scalpel. A 21 gauge needle directed into the right internal jugular vein with ultrasound guidance. A micropuncture dilator set was placed. A 23 cm tip to cuff Palindrome catheter was selected. The skin below the right clavicle was anesthetized and a small incision was made with an #11 blade scalpel. A subcutaneous tunnel was formed to the vein dermatotomy site. The catheter was brought through the tunnel. The vein dermatotomy site was dilated to accommodate a peel-away sheath. The catheter was placed through the peel-away sheath and directed into the central venous structures. The tip of the catheter was placed at the superior cavoatrial junction with fluoroscopy. Fluoroscopic images were obtained for documentation. Both lumens were found to aspirate and flush well. The proper amount of heparin was flushed in both lumens. The vein dermatotomy site was closed using a single layer of absorbable suture and Dermabond. Gel-Foam placed in the subcutaneous tract. The catheter was secured to the skin using Prolene suture. IMPRESSION: Successful placement of a right jugular tunneled dialysis catheter using ultrasound and fluoroscopic guidance.  Electronically Signed   By: Richarda Overlie M.D.   On: 05/07/2016 14:33   Dg Chest Port 1 View  Result Date: 05/06/2016 CLINICAL DATA:  Acute onset of generalized chest pain. Initial encounter. EXAM: PORTABLE CHEST 1 VIEW COMPARISON:  Chest radiograph performed 05/01/2016, and CT of the chest performed 05/02/2016 FINDINGS: The lungs are hypoexpanded. Vascular congestion and vascular crowding are noted. Increased interstitial markings may reflect mild interstitial edema. There is no evidence of pleural effusion or pneumothorax. The cardiomediastinal silhouette is mildly enlarged. No acute osseous abnormalities are seen. IMPRESSION: Lungs hypoexpanded. Vascular congestion and mild cardiomegaly. Increased interstitial markings could reflect mild interstitial edema. Electronically Signed   By: Roanna Raider M.D.   On: 05/06/2016 02:48    Lab Data:  CBC:  Recent Labs Lab 05/07/16 0737 05/08/16 0720 05/09/16 1030 05/11/16 0746  WBC 10.7* 11.3* 12.2* 17.2*  HGB 9.5* 8.7* 8.4* 8.4*  HCT 28.8* 27.2* 27.4* 27.8*  MCV 87.5 87.2 88.7 90.0  PLT 456* 423* 339 299   Basic Metabolic Panel:  Recent Labs Lab 05/06/16 0213 05/07/16 0737 05/08/16 0720 05/09/16 1030 05/11/16 0747  NA 142 143 142 142 146*  K 3.4* 3.7 3.7 3.9 4.3  CL 97* 98* 98* 100* 104  CO2 GLUCOSE 173* 169* 169* 155* 182*  BUN 130* 129* 131* 94* 74*  CREATININE 7.01* 6.66* 6.59* 5.64* 5.07*  CALCIUM 8.7* 8.8* 8.7* 8.8* 9.5  PHOS 7.2* 6.6*  --  5.1* 4.3   GFR: Estimated Creatinine Clearance: 21 mL/min (A) (by C-G formula based on SCr of 5.07 mg/dL (H)). Liver Function Tests:  Recent Labs Lab 05/06/16 0213 05/07/16 0737 05/09/16 1030 05/11/16 0747  ALBUMIN 2.1* 2.1* 2.0* 2.1*   No results for input(s): LIPASE, AMYLASE in the last 168 hours. No results for input(s): AMMONIA in the last 168 hours. Coagulation Profile:  Recent Labs Lab 05/07/16 0737 05/08/16 0720  INR 1.70 1.90   Cardiac  Enzymes:  Recent Labs Lab 05/06/16 0213  TROPONINI 0.04*   BNP (last 3 results) No results for input(s): PROBNP in the last 8760 hours. HbA1C: No results for input(s): HGBA1C in the last 72 hours. CBG:  Recent Labs Lab 05/11/16 1121 05/11/16 1649 05/11/16 2308 05/12/16 0804 05/12/16 1153  GLUCAP 151* 212* 207* 216* 225*   Lipid Profile: No results for input(s): CHOL, HDL, LDLCALC, TRIG, CHOLHDL, LDLDIRECT in the last 72 hours. Thyroid Function Tests: No results for input(s): TSH, T4TOTAL, FREET4, T3FREE, THYROIDAB in the last 72 hours. Anemia Panel: No results for input(s): VITAMINB12, FOLATE, FERRITIN, TIBC, IRON, RETICCTPCT in the last 72 hours. Urine analysis:    Component Value Date/Time   COLORURINE STRAW (A) 05/01/2016 1144   APPEARANCEUR CLEAR 05/01/2016 1144   LABSPEC 1.009 05/01/2016 1144   PHURINE 5.0 05/01/2016 1144   GLUCOSEU NEGATIVE 05/01/2016 1144   HGBUR SMALL (A) 05/01/2016 1144   BILIRUBINUR NEGATIVE 05/01/2016 1144   KETONESUR NEGATIVE 05/01/2016 1144   PROTEINUR 30 (A) 05/01/2016 1144   UROBILINOGEN 0.2 10/26/2013 1721   NITRITE NEGATIVE 05/01/2016 1144   LEUKOCYTESUR NEGATIVE 05/01/2016 1144     RAI,RIPUDEEP M.D. [facility] 05/12/2016, 12:38 PM  Pager: [phone] Between 7am to 7pm - call Pager - [phone]  After 7pm go to [email or web address] - password [email or web address]  Call night coverage person covering after 7pm

## 2016-05-12 NOTE — Progress Notes (Signed)
Pharmacy Antibiotic Note  [name] is a 41 y.o. female admitted on 05/01/2016 with sepsis. She was initially treated with vanc and zosyn, which were then transitioned to doxycycline for pneumonia. Now with recurrent low-grade fevers and increasing white count. All cultures negative except 1/2 CONS, which didn't register with BCID. Of note, patient is now ESRD on HD MWF, for which she has not been tolerating full sessions. Pharmacy now consulted to start ceftazidime.   Plan: Ceftazidime 1gm IV q24h Monitor clinical status and ability to de-escalate Monitor HD tolerance  Height:  (167.6 cm) Weight: 295 lb 6.7 oz (134 kg) IBW/kg (Calculated) : 59.3  Temp (24hrs), Avg:100.4 F (38 C), Min:100 F (37.8 C), Max:100.7 F (38.2 C)   Recent Labs Lab 05/06/16 0213 05/07/16 0737 05/08/16 0720 05/09/16 1030 05/11/16 0746 05/11/16 0747  WBC  --  10.7* 11.3* 12.2* 17.2*  --   CREATININE 7.01* 6.66* 6.59* 5.64*  --  5.07*    Estimated Creatinine Clearance: 21 mL/min (A) (by C-G formula based on SCr of 5.07 mg/dL (H)).    Allergies  Allergen Reactions  . Azithromycin Other (See Comments)    Nose bleeding event    Antimicrobials this admission: 3/21 Vanc >> 3/22 3/21 Zosyn >> 3/22 3/22 Doxy>> 4/1 Ceftaz   Dose adjustments this admission: 3/23 @ 0530:  RVL 25 after  on 3/23.  Drug d/c'd.   Microbiology results: 3/21 BCx: + for GPC in 1/2 bottles, no organism identified 3/21 UCx: sent 3/21 HIV Ab: neg  Thank you for allowing pharmacy to be a part of this patient's care.  Alfredo Bach, Cleotis Nipper, PharmD Clinical Pharmacy Resident [phone] (Pager) 05/12/2016 1:18 PM

## 2016-05-12 NOTE — Progress Notes (Signed)
[facility] Progress Note   Subjective:   Had 3rd HD yesterday Gets very anxious prior to TMT's Feels like she wants her inhaler with her in HD - fine with me  Weights are variable. Does appear is down at least 5 kg with 2 HD treatments.   Pt's mother who is also on HD was in the room when I saw today - she will try to encourage/push Kaily along.  She agrees to AVF/AVG now  Exam: BP 139/72 (BP Location: Right Arm)   Pulse (!) 101   Temp (!) 100.4 F (38 C) (Oral)   Resp 16   Ht  (1.676 m)   Wt 134 kg (295 lb 6.7 oz)   LMP 04/11/2016   SpO2 98%   BMI 47.68 kg/m  In bed, hamburger and fries on the bedside table Low grade temps Anteriorly clear, posteriorly basilar crackles, no wheezing Rhythm regular, normal heart sounds.  Abd soft ntnd no mass or ascites +bs Huge pannus Ext diffuse pitting edema of arms and lower legs- much improved Chronic skin changes/darkening of LE's/wrinkling now from fluid loss Dressings on LE wounds not removed today Some bruising for R IJ TDC placed 3/27  Inpatient medications: . amLODipine  10 mg Oral Daily  . atorvastatin  20 mg Oral Daily  . calcium acetate  667 mg Oral TID WC  . carvedilol  25 mg Oral BID WC  . cefTAZidime (FORTAZ)  IV  1 g Intravenous Q24H  . darbepoetin (ARANESP) injection - NON-DIALYSIS  150 mcg Subcutaneous Q Thu-1800  . doxycycline  100 mg Oral Q12H  . feeding supplement (NEPRO CARB STEADY)  237 mL Oral TID BM  . ferric gluconate (FERRLECIT/NULECIT) IV  125 mg Intravenous Q T,Th,Sa-HD  . heparin  5,000 Units Subcutaneous Q8H  . hydrALAZINE  100 mg Oral TID  . insulin aspart  0-5 Units Subcutaneous QHS  . insulin aspart  0-9 Units Subcutaneous TID WC  . insulin glargine  5 Units Subcutaneous QHS  . isosorbide mononitrate  30 mg Oral Daily  . multivitamin  1 tablet Oral QHS  . polyethylene glycol  17 g Oral Daily  . potassium chloride  40 mEq Oral Daily  . senna-docusate  1 tablet Oral BID      Recent Labs Lab 05/07/16 0737 05/08/16 0720 05/09/16 1030 05/11/16 0747  NA 143 142 142 146*  K 3.7 3.7 3.9 4.3  CL 98* 98* 100* 104  CO2 GLUCOSE 169* 169* 155* 182*  BUN 129* 131* 94* 74*  CREATININE 6.66* 6.59* 5.64* 5.07*  CALCIUM 8.8* 8.7* 8.8* 9.5  PHOS 6.6*  --  5.1* 4.3    Recent Labs Lab 05/07/16 0737 05/09/16 1030 05/11/16 0747  ALBUMIN 2.1* 2.0* 2.1*    Recent Labs Lab 05/08/16 0720 05/09/16 1030 05/11/16 0746  WBC 11.3* 12.2* 17.2*  HGB 8.7* 8.4* 8.4*  HCT 27.2* 27.4* 27.8*  MCV 87.2 88.7 90.0  PLT 423* 339 299     Iron/TIBC/Ferritin/ %Sat    Component Value Date/Time   IRON 24 (L) 05/07/2016 0737   TIBC NOT CALCULATED 05/07/2016 0737   FERRITIN 1,069 (H) 05/07/2016 0737   IRONPCTSAT NOT CALCULATED 05/07/2016 0737    Assessment: 1. 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  Dialysis initiated 05/08/16. TDC (IR 3/27). [facility]VS has seen. Probable L AVG. Will need to be rescheduled as she refused on 3/29 HD #3  yesterday. Get on usual MWF schedule tomorrow. CLIPPED to [facility] MWF 2nd shift. She will need to have her access done AND be able to sit in the chair for 5-6 hours. Have tried to make this clear to her again today - told her she HAS to start trying harder and she agrees to. . Her mother (who is also on HD) will try to encourage/push her along. Next HD Monday in the chair.  2. Anemia - of CKD +/- other. Transfused 2 U on 3/22.  Added darbe 150/week.  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 Ca acetate 1 c/meals for phos. Renal diet. 4. Morbid obesity 5. Debility - needs aggressive PT 6. Hist CVA 7. DM - per primary service 8. Inner thigh wounds - local care, dressing changes 9. Fever/leukocytosis - leg wounds would seem to be most likely source. WBC up to 17,000 yesterday, not repeated today. Was on doxy. Elita Quick added today by primary  team  Camille Bal, MD [facility] [phone] Pager 05/12/2016, 2:23 PM

## 2016-05-13 LAB — MRSA PCR SCREENING: MRSA BY PCR: NEGATIVE

## 2016-05-13 LAB — CBC
HEMATOCRIT: 28.7 % — AB (ref 36.0–46.0)
HEMOGLOBIN: 8.5 g/dL — AB (ref 12.0–15.0)
MCH: 26.9 pg (ref 26.0–34.0)
MCHC: 29.6 g/dL — AB (ref 30.0–36.0)
MCV: 90.8 fL (ref 78.0–100.0)
Platelets: 251 10*3/uL (ref 150–400)
RBC: 3.16 MIL/uL — AB (ref 3.87–5.11)
RDW: 15.3 % (ref 11.5–15.5)
WBC: 15 10*3/uL — ABNORMAL HIGH (ref 4.0–10.5)

## 2016-05-13 LAB — GLUCOSE, CAPILLARY
GLUCOSE-CAPILLARY: 161 mg/dL — AB (ref 65–99)
GLUCOSE-CAPILLARY: 145 mg/dL — AB (ref 65–99)
GLUCOSE-CAPILLARY: 138 mg/dL — AB (ref 65–99)
GLUCOSE-CAPILLARY: 145 mg/dL — AB (ref 65–99)

## 2016-05-13 MED ORDER — GI COCKTAIL ~~LOC~~
30.0000 mL | Freq: Once | ORAL | Status: DC
  Filled 2016-05-13: qty 30

## 2016-05-13 MED ORDER — HYDRALAZINE HCL 50 MG PO TABS
50.0000 mg | ORAL_TABLET | Freq: Three times a day (TID) | ORAL | Status: DC
  Administered 2016-05-13 – 2016-05-14 (×3): 50 mg via ORAL
  Filled 2016-05-13 (×3): qty 1

## 2016-05-13 MED ORDER — CEFAZOLIN IN D5W 1 GM/50ML IV SOLN
1.0000 g | INTRAVENOUS | Status: DC
  Filled 2016-05-13: qty 50

## 2016-05-13 NOTE — Progress Notes (Signed)
Progress Note  Pt continues to present to therapy in a stupor or lethargic state.  She is unable to maintain even a minimal amount of participation, even it just sitting up and forward while the straps are attached.    05/13/16 1400  PT Visit Information  Last PT Received On 05/13/16  Assistance Needed +2  History of Present Illness 41 y.o. female with medical history significant of morbid obesity, chronic kidney disease stage IV-V, diabetes mellitus, hypertension, prior CVA, diastolic CHF, presents to the emergency room with chief complaint of weakness as well as fever and diagnosed with acute hypoxic resp failure with acute encephalopathy  Subjective Data  Patient Stated Goal Wants to be able to move well enough to qualify for HD  Precautions  Precautions Fall  Pain Assessment  Pain Assessment Faces  Faces Pain Scale 0  Cognition  Arousal/Alertness Awake/alert  Behavior During Therapy Flat affect  Overall Cognitive Status Within Functional Limits for tasks assessed  General Comments pt very lethargic and "foggy" throughout.  Transfers  Equipment used 2 person hand held Publishing rights manager  Sit to Stand Total assist;+2 physical assistance  General transfer comment maxisky transfer recliner to HD chair with maxisky.  pt sitting up and forward to cue for positioning.  Balance  Sitting-balance support Single extremity supported;Feet supported  Sitting balance-Leahy Scale Fair  Sitting balance - Comments able to maintain sitting balance in recliner without back support.   General Comments  General comments (skin integrity, edema, etc.) pt still lethargic and minimally participative even it directing her positioning and how she interfaces with the lift equipment  PT - End of Session  Activity Tolerance Patient limited by fatigue  Patient left in chair;with call bell/phone within reach  Nurse Communication Mobility status  PT - Assessment/Plan  PT Plan Current  plan remains appropriate  PT Visit Diagnosis Muscle weakness (generalized) (M62.81);Other abnormalities of gait and mobility (R26.89)  PT Frequency (ACUTE ONLY) Min 3X/week  Follow Up Recommendations SNF;Supervision/Assistance - 24 hour  PT equipment Hospital bed;Other (comment);Wheelchair (measurements PT);Wheelchair cushion (measurements PT)  AM-PAC PT "6 Clicks" Daily Activity Outcome Measure  Difficulty turning over in bed (including adjusting bedclothes, sheets and blankets)? 1  Difficulty moving from lying on back to sitting on the side of the bed?  1  Difficulty sitting down on and standing up from a chair with arms (e.g., wheelchair, bedside commode, etc,.)? 1  Help needed moving to and from a bed to chair (including a wheelchair)? 1  Help needed walking in hospital room? 1  Help needed climbing 3-5 steps with a railing?  1  6 Click Score 6  Mobility G Code  CN  Acute Rehab PT Goals  PT Goal Formulation With patient  Time For Goal Achievement 05/22/16  Potential to Achieve Goals Fair  PT Time Calculation  PT Start Time (ACUTE ONLY) 1340  PT Stop Time (ACUTE ONLY) 1351  PT Time Calculation (min) (ACUTE ONLY) 11 min  PT General Charges  $$ ACUTE PT VISIT 1 Procedure  PT Treatments  $Therapeutic Activity 8-22 mins   05/13/2016  [place] Bing, PT [phone]  (pager)

## 2016-05-13 NOTE — Procedures (Signed)
I was present at this session.  I have reviewed the session itself and made appropriate changes.  bp stable, demands to s/o  [name] 4/2/20183:45 PM

## 2016-05-13 NOTE — Progress Notes (Signed)
[facility]                                                                              Patient Demographics  [name], is a 41 y.o. female, DOB - [birth year], WUJ:[id number]  Admit date - 05/01/2016   Admitting Physician Costin Otelia Sergeant, MD  Outpatient Primary MD for the patient is Marletta Lor, NP  Outpatient specialists:   LOS - 12  days    Chief Complaint  Patient presents with  . wounds       Brief summary  41 y.o.femalewith medical history significant for morbid obesity, chronic kidney disease stage IV-V, diabetes mellitus, hypertension, prior CVA, diastolic CHF who presented to ED with progressive weakness, fever for past 1 week prior to this admission. She is actually bed bound for past year or so per her husband report ever since stroke. She also had dry cough and shortness of breath for past 1 week. She was hemodynamically stable on admission. Her creatinine was 7.9 (much worse since her recent Cr values of 4-5). Renal has seen her in consultation. Due to her debility she is not a good candidate for HD. Palliative care was consulted as well.      Assessment & Plan   Acute metabolic encephalopathy - Improving, at baseline mental status - Likely multifactorial from history of CVA, end stage renal disease, possible uremia, acute infectious process - possible pneumonia  Chest pain episode  -  Troponin 1 was 0.04 however d-dimer was 2.01 - VQ scan Negative for any PE no further chest pain episodes  Acute renal failure superimposed on CKD stage 5 not on HD / Acute pulmonary edema  - Due to progressive disease from uncontrolled diabetes and FSG. Patient was placed on aggressive IV Lasix and metolazone per renal recommendations. - Tunneled dialysis cath Placed on 3/27. Hemodialysis initiated 3/28, Second HD on 3/29, Third HD 4/1 - Vascular surgery consulted, patient refused with graft placement - Accepted at [facility]  [address] .Start Date and Time 1st treatment Wednesday 05/15/16 at 12:00 pm, tentative schedule MWF  - Vascular surgery also following, recommended patient to have fistula surgery while inpatient.   Acute respiratory failure with hypoxia / Right middle lobe pneumonia / Leukocytosis  - CT chest without contrast showed linear density over the right middle lobe likely atelectasis, although cannot completely exclude infection - She is on empiric doxycycline  - Blood cultures 1/2 showed coagnegative staph likely contaminant  -Fevers improving, leukocytosis trending down, continue Fortaz IV, doxycycline   Anemia of chronic disease / IDA - Due to CKD - Status post 2 units PRBC transfusion on 05/02/2016  - Aranesp 3/22, continue iron supplementation, B12  Uncontrolled diabetes mellitus with diabetic nephropathy with long term insulin use - Continue current insulin regimen: SSI - CBGs elevated, add 5 units Lantus at bedtime  Dyslipidemia associated with type 2 DM - Continue statin therapy   Essential hypertension - Continue Norvasc, coreg, hydralazine, Imdur   Bilateral inner thigh wounds - Related to moisture and friction from thighs rubbing together - Left inner thigh has large area with many open areas scattered  throughout, 17cm x 12cm x 0.1cm  - Right inner thigh wound is 8cm x 5cm x 0.3cm  - Dressing procedure/placement/frequency: Cleanse with NS, gently pat dry, apply Xeroform gauze, top with dry gauze,change every shift. Pt is already on a low air loss mattress - Fevers improving, leukocytosis trending down, continue Fortaz IV, doxycycline   Morbid obesity / Debility  - Nutrition consulted - Body mass index is 54.58 kg/m.  Code Status: full  DVT Prophylaxis:  heparin  Family Communication: Discussed in detail with the patient, all imaging results, lab results explained to the patient.   Disposition Plan: When cleared by nephrology, CLIP process needed  Time Spent in  minutes 25 minutes  Procedures:  Hemodialysis is initiated 3/28  Consultants:    Nephrology  WOC  Palliative care   Nutrition  Antimicrobials:  Doxycycline 3/21->   Medications  Scheduled Meds: . albuterol  3 mL Inhalation Q M,W,F-HD  . amLODipine  10 mg Oral Daily  . atorvastatin  20 mg Oral Daily  . calcium acetate  667 mg Oral TID WC  . carvedilol  25 mg Oral BID WC  . cefTAZidime (FORTAZ)  IV  1 g Intravenous Q24H  . [START ON 05/17/2016] darbepoetin (ARANESP) injection - DIALYSIS  150 mcg Intravenous Q Fri-HD  . doxycycline  100 mg Oral Q12H  . feeding supplement (NEPRO CARB STEADY)  237 mL Oral TID BM  . ferric gluconate (FERRLECIT/NULECIT) IV  125 mg Intravenous Q M,W,F-HD  . heparin  5,000 Units Subcutaneous Q8H  . hydrALAZINE  50 mg Oral TID  . insulin aspart  0-5 Units Subcutaneous QHS  . insulin aspart  0-9 Units Subcutaneous TID WC  . insulin glargine  5 Units Subcutaneous QHS  . isosorbide mononitrate  30 mg Oral Daily  . multivitamin  1 tablet Oral QHS  . polyethylene glycol  17 g Oral Daily  . senna-docusate  1 tablet Oral BID   Continuous Infusions: . sodium chloride     PRN Meds:.acetaminophen, albuterol, camphor-menthol, guaiFENesin-dextromethorphan, HYDROcodone-acetaminophen, morphine injection, nitroGLYCERIN, ondansetron **OR** ondansetron (ZOFRAN) IV   Antibiotics   Anti-infectives    Start     Dose/Rate Route Frequency Ordered Stop   05/12/16 1330  cefTAZidime (FORTAZ) 1 g in dextrose 5 % 50 mL IVPB     1 g 100 mL/hr over 30 Minutes Intravenous Every 24 hours 05/12/16 1319     05/08/16 1000  vancomycin (VANCOCIN) IVPB 1000 mg/200 mL premix     1,000 mg 200 mL/hr over 60 Minutes Intravenous To Surgery 05/07/16 1904 05/08/16 1004   05/08/16 0902  vancomycin (VANCOCIN) 1-5 GM/200ML-% IVPB    Comments:  Matilde Haymaker, [name]   : cabinet override      05/08/16 0902 05/08/16 0904   05/07/16 1300  ceFAZolin (ANCEF) 3 g in dextrose 5 % 50 mL IVPB       3 g 130 mL/hr over 30 Minutes Intravenous To Radiology 05/07/16 1232 05/07/16 1330   05/02/16 2200  doxycycline (VIBRA-TABS) tablet 100 mg     100 mg Oral Every 12 hours 05/02/16 1732     05/01/16 2200  piperacillin-tazobactam (ZOSYN) IVPB 2.25 g  Status:  Discontinued     2.25 g 100 mL/hr over 30 Minutes Intravenous Every 8 hours 05/01/16 1324 05/02/16 1729   05/01/16 1330  vancomycin (VANCOCIN) 2,500 mg in sodium chloride 0.9 % 500 mL IVPB     2,500 mg 250 mL/hr over 120 Minutes Intravenous  Once 05/01/16 1309 05/01/16 1551  05/01/16 1300  piperacillin-tazobactam (ZOSYN) IVPB 3.375 g     3.375 g 100 mL/hr over 30 Minutes Intravenous  Once 05/01/16 1258 05/01/16 1340   05/01/16 1300  vancomycin (VANCOCIN) IVPB 1000 mg/200 mL premix  Status:  Discontinued     1,000 mg 200 mL/hr over 60 Minutes Intravenous  Once 05/01/16 1258 05/01/16 1308        Subjective:   Insiya Yahira Timberman was seen and examined today. Fever curve is improving, denies any specific complaints. Patient denies dizziness, shortness of breath, abdominal pain, N/V/D/C, new weakness, numbess, tingling.   Objective:   Vitals:   05/12/16 1750 05/12/16 2128 05/13/16 0549 05/13/16 1002  BP: (!) 142/61 (!) 150/76 136/69 138/63  Pulse: 95 95 96 94  Resp: Temp: 98.7 F (37.1 C) 100.2 F (37.9 C) 99.3 F (37.4 C) 99.8 F (37.7 C)  TempSrc: Oral Oral Oral Oral  SpO2: 93% 93% 100% 91%  Weight:  131 kg (288 lb 12.8 oz)    Height:        Intake/Output Summary (Last 24 hours) at 05/13/16 1325 Last data filed at 05/13/16 1000  Gross per 24 hour  Intake              600 ml  Output              675 ml  Net              -75 ml     Wt Readings from Last 3 Encounters:  05/12/16 131 kg (288 lb 12.8 oz)  03/29/16 (!) 167 kg (368 lb 3.2 oz)  03/09/16 (!) 168.3 kg (371 lb)     Exam  General: Alert and oriented 3 NAD  HEENT:  Neck: Supple,   Cardiovascular: S1 S2 clear,  RRR  Respiratory: CTAB  Gastrointestinal: Morbidly obese, soft, NT, ND  Ext: no cyanosis clubbing or edema,  Neuro: no new deficits  Skin: No rashes  Psych: Normal affect and demeanor, alert and oriented x3    Data Reviewed:  I have personally reviewed following labs and imaging studies  Micro Results No results found for this or any previous visit (from the past 240 hour(s)).  Radiology Reports Dg Chest 2 View  Result Date: 05/01/2016 CLINICAL DATA:  Fever and weakness. EXAM: CHEST  2 VIEW COMPARISON:  03/22/2016. FINDINGS: Cardiomegaly with mild bilateral interstitial prominence. No pleural effusion or pneumothorax. No acute bony abnormality. IMPRESSION: Cardiomegaly with mild bilateral interstitial prominence consistent with CHF. Findings have improved from prior study of 03/22/2016. Given patient's history of fever pneumonitis cannot be excluded. Electronically Signed   By: Maisie Fus  Register   On: 05/01/2016 13:30   Ct Chest Wo Contrast  Result Date: 05/02/2016 CLINICAL DATA:  Morbid obesity week chronic kidney disease and diabetes. Complains of weakness and fever over the past week. Cough. EXAM: CT CHEST WITHOUT CONTRAST TECHNIQUE: Multidetector CT imaging of the chest was performed following the standard protocol without IV contrast. COMPARISON:  Chest x-ray 05/01/2016 FINDINGS: Cardiovascular: Mild to moderate cardiomegaly. Remaining vascular structures are unremarkable. Mediastinum/Nodes: No definite mediastinal or hilar adenopathy. Remaining mediastinal structures are unremarkable. Lungs/Pleura: Lungs are somewhat hypoinflated and demonstrate mild bibasilar linear atelectasis. Linear density over the posterior and anterior right middle lobe likely atelectasis. No evidence of effusion. Airways are unremarkable. Upper Abdomen: Mild calcified plaque over the celiac axis, splenic artery and superior mesenteric arteries. Musculoskeletal: Minimal degenerative change of the spine.  Prominent overlying soft tissues.  IMPRESSION: Linear density over the right middle lobe likely atelectasis, although cannot completely exclude infection. Minimal linear bibasilar atelectasis. Moderate cardiomegaly. Electronically Signed   By: Elberta Fortis M.D.   On: 05/02/2016 15:53   Nm Pulmonary Perf And Vent  Result Date: 05/06/2016 CLINICAL DATA:  Chest pain EXAM: NUCLEAR MEDICINE VENTILATION - PERFUSION LUNG SCAN VIEWS: Anterior, posterior, RAO, LAO, RPO, LPO-ventilation and profusion. RADIOPHARMACEUTICALS:  32.6 mCi Technetium-68m DTPA aerosol inhalation and 4.2 mCi Technetium-40m MAA IV COMPARISON:  Chest radiograph May 06, 2016 FINDINGS: Ventilation: Radiotracer uptake is homogeneous and symmetric. No ventilation defects are evident. Perfusion: Radiotracer uptake is homogeneous and symmetric bilaterally. No perfusion defects evident. Cardiomegaly noted. IMPRESSION: No appreciable ventilation or perfusion defects. This study constitutes a very low probability of pulmonary embolus. Cardiomegaly noted. Electronically Signed   By: Bretta Bang III M.D.   On: 05/06/2016 14:19   Ir Fluoro Guide Cv Line Right  Result Date: 05/07/2016 INDICATION: End-stage renal disease and starting hemodialysis. EXAM: FLUOROSCOPIC AND ULTRASOUND GUIDED PLACEMENT OF A TUNNELED DIALYSIS CATHETER Physician: Rachelle Hora. Lowella Dandy, MD MEDICATIONS: Ancef 3 g; The antibiotic was administered within an appropriate time interval prior to skin puncture. ANESTHESIA/SEDATION: Versed 2.0 mg IV; Fentanyl 100 mcg IV; Moderate Sedation Time:  20 minutes The patient was continuously monitored during the procedure by the interventional radiology nurse under my direct supervision. FLUOROSCOPY TIME:  Fluoroscopy Time: 24 seconds, 2.5 mGy COMPLICATIONS: None immediate. PROCEDURE: Informed consent was obtained for placement of a tunneled dialysis catheter. The patient was placed supine on the interventional table. Ultrasound confirmed a patent  right internal jugular vein. Ultrasound images were obtained for documentation. The right side of the neck was prepped and draped in a sterile fashion. The right side of the neck was anesthetized with 1% lidocaine. Maximal barrier sterile technique was utilized including caps, mask, sterile gowns, sterile gloves, sterile drape, hand hygiene and skin antiseptic. A small incision was made with #11 blade scalpel. A 21 gauge needle directed into the right internal jugular vein with ultrasound guidance. A micropuncture dilator set was placed. A 23 cm tip to cuff Palindrome catheter was selected. The skin below the right clavicle was anesthetized and a small incision was made with an #11 blade scalpel. A subcutaneous tunnel was formed to the vein dermatotomy site. The catheter was brought through the tunnel. The vein dermatotomy site was dilated to accommodate a peel-away sheath. The catheter was placed through the peel-away sheath and directed into the central venous structures. The tip of the catheter was placed at the superior cavoatrial junction with fluoroscopy. Fluoroscopic images were obtained for documentation. Both lumens were found to aspirate and flush well. The proper amount of heparin was flushed in both lumens. The vein dermatotomy site was closed using a single layer of absorbable suture and Dermabond. Gel-Foam placed in the subcutaneous tract. The catheter was secured to the skin using Prolene suture. IMPRESSION: Successful placement of a right jugular tunneled dialysis catheter using ultrasound and fluoroscopic guidance. Electronically Signed   By: Richarda Overlie M.D.   On: 05/07/2016 14:33   Ir US Guide Vasc Access Right  Result Date: 05/07/2016 INDICATION: End-stage renal disease and starting hemodialysis. EXAM: FLUOROSCOPIC AND ULTRASOUND GUIDED PLACEMENT OF A TUNNELED DIALYSIS CATHETER Physician: Rachelle Hora. Lowella Dandy, MD MEDICATIONS: Ancef 3 g; The antibiotic was administered within an appropriate time  interval prior to skin puncture. ANESTHESIA/SEDATION: Versed 2.0 mg IV; Fentanyl 100 mcg IV; Moderate Sedation Time:  20 minutes The patient was continuously monitored  during the procedure by the interventional radiology nurse under my direct supervision. FLUOROSCOPY TIME:  Fluoroscopy Time: 24 seconds, 2.5 mGy COMPLICATIONS: None immediate. PROCEDURE: Informed consent was obtained for placement of a tunneled dialysis catheter. The patient was placed supine on the interventional table. Ultrasound confirmed a patent right internal jugular vein. Ultrasound images were obtained for documentation. The right side of the neck was prepped and draped in a sterile fashion. The right side of the neck was anesthetized with 1% lidocaine. Maximal barrier sterile technique was utilized including caps, mask, sterile gowns, sterile gloves, sterile drape, hand hygiene and skin antiseptic. A small incision was made with #11 blade scalpel. A 21 gauge needle directed into the right internal jugular vein with ultrasound guidance. A micropuncture dilator set was placed. A 23 cm tip to cuff Palindrome catheter was selected. The skin below the right clavicle was anesthetized and a small incision was made with an #11 blade scalpel. A subcutaneous tunnel was formed to the vein dermatotomy site. The catheter was brought through the tunnel. The vein dermatotomy site was dilated to accommodate a peel-away sheath. The catheter was placed through the peel-away sheath and directed into the central venous structures. The tip of the catheter was placed at the superior cavoatrial junction with fluoroscopy. Fluoroscopic images were obtained for documentation. Both lumens were found to aspirate and flush well. The proper amount of heparin was flushed in both lumens. The vein dermatotomy site was closed using a single layer of absorbable suture and Dermabond. Gel-Foam placed in the subcutaneous tract. The catheter was secured to the skin using Prolene  suture. IMPRESSION: Successful placement of a right jugular tunneled dialysis catheter using ultrasound and fluoroscopic guidance. Electronically Signed   By: Richarda Overlie M.D.   On: 05/07/2016 14:33   Dg Chest Port 1 View  Result Date: 05/06/2016 CLINICAL DATA:  Acute onset of generalized chest pain. Initial encounter. EXAM: PORTABLE CHEST 1 VIEW COMPARISON:  Chest radiograph performed 05/01/2016, and CT of the chest performed 05/02/2016 FINDINGS: The lungs are hypoexpanded. Vascular congestion and vascular crowding are noted. Increased interstitial markings may reflect mild interstitial edema. There is no evidence of pleural effusion or pneumothorax. The cardiomediastinal silhouette is mildly enlarged. No acute osseous abnormalities are seen. IMPRESSION: Lungs hypoexpanded. Vascular congestion and mild cardiomegaly. Increased interstitial markings could reflect mild interstitial edema. Electronically Signed   By: Roanna Raider M.D.   On: 05/06/2016 02:48    Lab Data:  CBC:  Recent Labs Lab 05/07/16 0737 05/08/16 0720 05/09/16 1030 05/11/16 0746 05/13/16 0735  WBC 10.7* 11.3* 12.2* 17.2* 15.0*  HGB 9.5* 8.7* 8.4* 8.4* 8.5*  HCT 28.8* 27.2* 27.4* 27.8* 28.7*  MCV 87.5 87.2 88.7 90.0 90.8  PLT 456* 423* 339 299 251   Basic Metabolic Panel:  Recent Labs Lab 05/07/16 0737 05/08/16 0720 05/09/16 1030 05/11/16 0747 05/12/16 1611  NA 143 142 142 146* 141  K 3.7 3.7 3.9 4.3 5.5*  CL 98* 98* 100* 104 102  CO2 GLUCOSE 169* 169* 155* 182* 224*  BUN 129* 131* 94* 74* 54*  CREATININE 6.66* 6.59* 5.64* 5.07* 4.11*  CALCIUM 8.8* 8.7* 8.8* 9.5 9.4  PHOS 6.6*  --  5.1* 4.3 3.5   GFR: Estimated Creatinine Clearance: 25.5 mL/min (A) (by C-G formula based on SCr of 4.11 mg/dL (H)). Liver Function Tests:  Recent Labs Lab 05/07/16 0737 05/09/16 1030 05/11/16 0747 05/12/16 1611  ALBUMIN 2.1* 2.0* 2.1* 2.1*   No results  for input(s): LIPASE, AMYLASE in the last 168  hours. No results for input(s): AMMONIA in the last 168 hours. Coagulation Profile:  Recent Labs Lab 05/07/16 0737 05/08/16 0720  INR 1.70 1.90   Cardiac Enzymes: No results for input(s): CKTOTAL, CKMB, CKMBINDEX, TROPONINI in the last 168 hours. BNP (last 3 results) No results for input(s): PROBNP in the last 8760 hours. HbA1C: No results for input(s): HGBA1C in the last 72 hours. CBG:  Recent Labs Lab 05/12/16 1153 05/12/16 1644 05/12/16 2132 05/13/16 0806 05/13/16 1159  GLUCAP 225* 220* 213* 161* 145*   Lipid Profile: No results for input(s): CHOL, HDL, LDLCALC, TRIG, CHOLHDL, LDLDIRECT in the last 72 hours. Thyroid Function Tests: No results for input(s): TSH, T4TOTAL, FREET4, T3FREE, THYROIDAB in the last 72 hours. Anemia Panel: No results for input(s): VITAMINB12, FOLATE, FERRITIN, TIBC, IRON, RETICCTPCT in the last 72 hours. Urine analysis:    Component Value Date/Time   COLORURINE STRAW (A) 05/01/2016 1144   APPEARANCEUR CLEAR 05/01/2016 1144   LABSPEC 1.009 05/01/2016 1144   PHURINE 5.0 05/01/2016 1144   GLUCOSEU NEGATIVE 05/01/2016 1144   HGBUR SMALL (A) 05/01/2016 1144   BILIRUBINUR NEGATIVE 05/01/2016 1144   KETONESUR NEGATIVE 05/01/2016 1144   PROTEINUR 30 (A) 05/01/2016 1144   UROBILINOGEN 0.2 10/26/2013 1721   NITRITE NEGATIVE 05/01/2016 1144   LEUKOCYTESUR NEGATIVE 05/01/2016 1144     Lenzy Kerschner M.D. [facility] 05/13/2016, 1:25 PM  Pager: [phone] Between 7am to 7pm - call Pager - [phone]  After 7pm go to [email or web address] - password [email or web address]  Call night coverage person covering after 7pm

## 2016-05-13 NOTE — Progress Notes (Signed)
Subjective: Interval History: has no complaint . Agrees to up in chair.  Objective: Vital signs in last 24 hours: Temp:  [98.7 F (37.1 C)-100.2 F (37.9 C)] 99.8 F (37.7 C) (04/02 1002) Pulse Rate:  [94-96] 94 (04/02 1002) Resp:  [14-17] 17 (04/02 1002) BP: (136-150)/(61-76) 138/63 (04/02 1002) SpO2:  [91 %-100 %] 91 % (04/02 1002) Weight:  [131 kg (288 lb 12.8 oz)] 131 kg (288 lb 12.8 oz) (04/01 2128) Weight change: -8 kg (-17 lb 10.2 oz)  Intake/Output from previous day: 04/01 0701 - 04/02 0700 In: 840 [P.O.:840] Out: 675 [Urine:675] Intake/Output this shift: No intake/output data recorded.  General appearance: alert, cooperative, no distress and morbidly obese Resp: diminished breath sounds bilaterally Chest wall: RIJ cath Cardio: S1, S2 normal and systolic murmur: holosystolic 2/6, blowing at apex GI: massive, pos bs, soft Extremities: edema chronic changes, 2 + pre sacral  Lab Results:  Recent Labs  05/11/16 0746 05/13/16 0735  WBC 17.2* 15.0*  HGB 8.4* 8.5*  HCT 27.8* 28.7*  PLT 299 251   BMET:  Recent Labs  05/11/16 0747 05/12/16 1611  NA 146* 141  K 4.3 5.5*  CL 104 102  CO2 28 28  GLUCOSE 182* 224*  BUN 74* 54*  CREATININE 5.07* 4.11*  CALCIUM 9.5 9.4   No results for input(s): PTH in the last 72 hours. Iron Studies: No results for input(s): IRON, TIBC, TRANSFERRIN, FERRITIN in the last 72 hours.  Studies/Results: No results found.  I have reviewed the patient's current medications.  Assessment/Plan: 1 ESRD DM vs FSG of obesity. For Hd, needs perm access. 2 DM controlled 3 Massive obesity 4 Debill  Must be able to transfer 5 HTN lower vol and meds 6 Access P HD, access, mobilze control DM, lower bp meds    LOS: 12 days   Cleston Lautner L 05/13/2016,11:36 AM

## 2016-05-13 NOTE — Progress Notes (Signed)
[id number] ID: [name], female   DOB: 01/29/1976, 40 y.o.   MRN: [id number] The patient is currently on hemodialysis. Her right IJ catheter. She was seen in consultation last week far service. Was scheduled for a left arm AV access. The patient decided the morning of in the holding area that she did not want to have surgery. We have now been requested to place new access. Discussed plan for left arm AV fistula versus AV graft. She states that she is willing to proceed tomorrow. Has been scheduled for Dr. Dickson tomorrow 

## 2016-05-13 NOTE — Progress Notes (Signed)
qPhysical Therapy Treatment Patient Details Name: [name] MRN: [id number] DOB: 01/25/1976 Today's Date: 05/13/2016    History of Present Illness 41 y.o. female with medical history significant of morbid obesity, chronic kidney disease stage IV-V, diabetes mellitus, hypertension, prior CVA, diastolic CHF, presents to the emergency room with chief complaint of weakness as well as fever and diagnosed with acute hypoxic resp failure with acute encephalopathy    PT Comments    Pt slowly progressing towards goals. Max encouragement for participation. Transferred pt from bed to chair using maxisky. Pt requiring max A for rolling this session. Current recommendations remain appropriate. Will continue to follow.    Follow Up Recommendations  SNF;Supervision/Assistance - 24 hour     Equipment Recommendations  Hospital bed;Other (comment);Wheelchair (measurements PT);Wheelchair cushion (measurements PT) (mechanical lift, bari WC)    Recommendations for Other Services       Precautions / Restrictions Precautions Precautions: Fall Restrictions Weight Bearing Restrictions: No    Mobility  Bed Mobility Overal bed mobility: Needs Assistance Bed Mobility: Rolling Rolling: Max assist;+2 for physical assistance         General bed mobility comments: Max A +2 for rolling this session to place lift pad. Pt not very participatory upon command, however, pt was able to assist with movement when she thought we were sitting up on the EOB.   Transfers Overall transfer level: Needs assistance               General transfer comment: Maxisky used to move pt from bed to recliner this session secondary to low tone and decreased participation.   Ambulation/Gait                 Stairs            Wheelchair Mobility    Modified Rankin (Stroke Patients Only)       Balance Overall balance assessment: Needs assistance Sitting-balance support: Single extremity  supported;Feet supported Sitting balance-Leahy Scale: Fair Sitting balance - Comments: able to maintain sitting balance in recliner without back support.                                     Cognition Arousal/Alertness: Awake/alert Behavior During Therapy: Flat affect Overall Cognitive Status: Within Functional Limits for tasks assessed                                        Exercises Other Exercises Other Exercises: RUE resisted elbow extension and flexion X2.  Other Exercises: RLE resisted knee/hip flexion/extension X 3. Attempted on LLE, however, pt reporting increased pain.     General Comments General comments (skin integrity, edema, etc.): Attempted LE and UE resistive exercises this session, however, pt presents with low participation. Pt reporting she is tired and going to hemodialysis soon.       Pertinent Vitals/Pain Pain Assessment: Faces Faces Pain Scale: Hurts little more Pain Location: Bilateral thigh wounds Pain Descriptors / Indicators: Sore;Grimacing Pain Intervention(s): Limited activity within patient's tolerance;Monitored during session;Repositioned    Home Living                      Prior Function            PT Goals (current goals can now be found in the care plan section) Acute Rehab  PT Goals Patient Stated Goal: Wants to be able to move well enough to qualify for HD PT Goal Formulation: With patient Time For Goal Achievement: 05/22/16 Potential to Achieve Goals: Fair Progress towards PT goals: Progressing toward goals    Frequency    Min 3X/week      PT Plan Current plan remains appropriate    Co-evaluation             End of Session   Activity Tolerance: Patient limited by fatigue Patient left: in chair;with call bell/phone within reach Nurse Communication: Mobility status PT Visit Diagnosis: Muscle weakness (generalized) (M62.81);Other abnormalities of gait and mobility (R26.89)      Time: 1610-9604 PT Time Calculation (min) (ACUTE ONLY): 26 min  Charges:  $Therapeutic Activity: 23-37 mins                    G Codes:       [name], PT, DPT  Acute Rehabilitation Services  Pager: [phone]  [name] 05/13/2016, 2:00 PM

## 2016-05-14 ENCOUNTER — Encounter (HOSPITAL_COMMUNITY)
Admission: EM | Disposition: A | Discharge status: Skilled Nursing Facility | Payer: Self-pay | Source: Home / Self Care | Attending: Internal Medicine

## 2016-05-14 ENCOUNTER — Inpatient Hospital Stay (HOSPITAL_COMMUNITY): Payer: Medicaid Other | Admitting: Anesthesiology

## 2016-05-14 ENCOUNTER — Encounter (HOSPITAL_COMMUNITY): Payer: Self-pay | Admitting: Anesthesiology

## 2016-05-14 DIAGNOSIS — N185 Chronic kidney disease, stage 5: Secondary | ICD-10-CM

## 2016-05-14 HISTORY — PX: AV FISTULA PLACEMENT: SHX1204

## 2016-05-14 LAB — CBC
HEMATOCRIT: 28.9 % — AB (ref 36.0–46.0)
HEMATOCRIT: 27.6 % — AB (ref 36.0–46.0)
HEMOGLOBIN: 8.7 g/dL — AB (ref 12.0–15.0)
Hemoglobin: 8.3 g/dL — ABNORMAL LOW (ref 12.0–15.0)
MCH: 27.4 pg (ref 26.0–34.0)
MCH: 26.9 pg (ref 26.0–34.0)
MCHC: 30.1 g/dL (ref 30.0–36.0)
MCHC: 30.1 g/dL (ref 30.0–36.0)
MCV: 90.9 fL (ref 78.0–100.0)
MCV: 89.6 fL (ref 78.0–100.0)
PLATELETS: 225 10*3/uL (ref 150–400)
Platelets: 254 10*3/uL (ref 150–400)
RBC: 3.18 MIL/uL — AB (ref 3.87–5.11)
RBC: 3.08 MIL/uL — ABNORMAL LOW (ref 3.87–5.11)
RDW: 15.1 % (ref 11.5–15.5)
RDW: 15 % (ref 11.5–15.5)
WBC: 15.7 10*3/uL — AB (ref 4.0–10.5)
WBC: 14.7 10*3/uL — ABNORMAL HIGH (ref 4.0–10.5)

## 2016-05-14 LAB — BASIC METABOLIC PANEL
ANION GAP: 12 (ref 5–15)
BUN: 36 mg/dL — ABNORMAL HIGH (ref 6–20)
CHLORIDE: 98 mmol/L — AB (ref 101–111)
CO2: 27 mmol/L (ref 22–32)
Calcium: 9 mg/dL (ref 8.9–10.3)
Creatinine, Ser: 3.17 mg/dL — ABNORMAL HIGH (ref 0.44–1.00)
GFR calc non Af Amer: 17 mL/min — ABNORMAL LOW (ref >60)
GFR, EST AFRICAN AMERICAN: 20 mL/min — AB (ref >60)
Glucose, Bld: 179 mg/dL — ABNORMAL HIGH (ref 65–99)
POTASSIUM: 4.3 mmol/L (ref 3.5–5.1)
Sodium: 137 mmol/L (ref 135–145)

## 2016-05-14 LAB — GLUCOSE, CAPILLARY
GLUCOSE-CAPILLARY: 164 mg/dL — AB (ref 65–99)
GLUCOSE-CAPILLARY: 234 mg/dL — AB (ref 65–99)
Glucose-Capillary: 183 mg/dL — ABNORMAL HIGH (ref 65–99)
Glucose-Capillary: 158 mg/dL — ABNORMAL HIGH (ref 65–99)
Glucose-Capillary: 167 mg/dL — ABNORMAL HIGH (ref 65–99)

## 2016-05-14 LAB — RENAL FUNCTION PANEL
Albumin: 1.8 g/dL — ABNORMAL LOW (ref 3.5–5.0)
Anion gap: 11 (ref 5–15)
BUN: 45 mg/dL — ABNORMAL HIGH (ref 6–20)
CHLORIDE: 101 mmol/L (ref 101–111)
CO2: 25 mmol/L (ref 22–32)
CREATININE: 3.41 mg/dL — AB (ref 0.44–1.00)
Calcium: 8.9 mg/dL (ref 8.9–10.3)
GFR calc Af Amer: 18 mL/min — ABNORMAL LOW (ref >60)
GFR calc non Af Amer: 16 mL/min — ABNORMAL LOW (ref >60)
Glucose, Bld: 171 mg/dL — ABNORMAL HIGH (ref 65–99)
POTASSIUM: 4.7 mmol/L (ref 3.5–5.1)
Phosphorus: 3.5 mg/dL (ref 2.5–4.6)
Sodium: 137 mmol/L (ref 135–145)

## 2016-05-14 SURGERY — ARTERIOVENOUS (AV) FISTULA CREATION
Anesthesia: Monitor Anesthesia Care | Site: Arm Upper | Laterality: Left

## 2016-05-14 MED ORDER — LIDOCAINE-EPINEPHRINE 0.5 %-1:200000 IJ SOLN
INTRAMUSCULAR | Status: AC
  Filled 2016-05-14: qty 1

## 2016-05-14 MED ORDER — LIDOCAINE-EPINEPHRINE 2 %-1:100000 IJ SOLN
INTRAMUSCULAR | Status: AC
  Filled 2016-05-14: qty 1

## 2016-05-14 MED ORDER — HYDRALAZINE HCL 50 MG PO TABS
50.0000 mg | ORAL_TABLET | Freq: Two times a day (BID) | ORAL | Status: DC
  Administered 2016-05-14 – 2016-05-18 (×7): 50 mg via ORAL
  Filled 2016-05-14 (×7): qty 1

## 2016-05-14 MED ORDER — AMLODIPINE BESYLATE 10 MG PO TABS
10.0000 mg | ORAL_TABLET | Freq: Every day | ORAL | Status: DC
  Administered 2016-05-14: 10 mg via ORAL
  Filled 2016-05-14: qty 1

## 2016-05-14 MED ORDER — ONDANSETRON HCL 4 MG/2ML IJ SOLN
INTRAMUSCULAR | Status: DC | PRN
  Administered 2016-05-14: 4 mg via INTRAVENOUS

## 2016-05-14 MED ORDER — FENTANYL CITRATE (PF) 250 MCG/5ML IJ SOLN
INTRAMUSCULAR | Status: AC
  Filled 2016-05-14: qty 5

## 2016-05-14 MED ORDER — CALCIUM ACETATE (PHOS BINDER) 667 MG PO CAPS
667.0000 mg | ORAL_CAPSULE | Freq: Three times a day (TID) | ORAL | Status: DC
  Administered 2016-05-14 – 2016-05-20 (×13): 667 mg via ORAL
  Filled 2016-05-14 (×12): qty 1

## 2016-05-14 MED ORDER — CEFAZOLIN SODIUM 1 G IJ SOLR
INTRAMUSCULAR | Status: AC
  Filled 2016-05-14: qty 10

## 2016-05-14 MED ORDER — ONDANSETRON HCL 4 MG/2ML IJ SOLN
INTRAMUSCULAR | Status: AC
  Filled 2016-05-14: qty 2

## 2016-05-14 MED ORDER — PROTAMINE SULFATE 10 MG/ML IV SOLN
INTRAVENOUS | Status: DC | PRN
  Administered 2016-05-14 (×4): 10 mg via INTRAVENOUS

## 2016-05-14 MED ORDER — HEPARIN SODIUM (PORCINE) 5000 UNIT/ML IJ SOLN
5000.0000 [IU] | Freq: Three times a day (TID) | INTRAMUSCULAR | Status: DC
  Administered 2016-05-15 – 2016-05-31 (×46): 5000 [IU] via SUBCUTANEOUS
  Filled 2016-05-14 (×41): qty 1

## 2016-05-14 MED ORDER — LIDOCAINE-PRILOCAINE 2.5-2.5 % EX CREA: 1.0000 "application " | TOPICAL_CREAM | CUTANEOUS | Status: DC | PRN

## 2016-05-14 MED ORDER — SODIUM CHLORIDE 0.9 % IV SOLN: 100.0000 mL | INTRAVENOUS | Status: DC | PRN

## 2016-05-14 MED ORDER — LIDOCAINE 2% (20 MG/ML) 5 ML SYRINGE
INTRAMUSCULAR | Status: AC
  Filled 2016-05-14: qty 5

## 2016-05-14 MED ORDER — MIDAZOLAM HCL 5 MG/5ML IJ SOLN
INTRAMUSCULAR | Status: DC | PRN
  Administered 2016-05-14: 2 mg via INTRAVENOUS

## 2016-05-14 MED ORDER — PROPOFOL 500 MG/50ML IV EMUL
INTRAVENOUS | Status: DC | PRN
  Administered 2016-05-14: 75 ug/kg/min via INTRAVENOUS

## 2016-05-14 MED ORDER — HEPARIN SODIUM (PORCINE) 1000 UNIT/ML DIALYSIS
40.0000 [IU]/kg | Freq: Once | INTRAMUSCULAR | Status: DC
  Filled 2016-05-14: qty 5

## 2016-05-14 MED ORDER — THROMBIN 20000 UNITS EX SOLR
CUTANEOUS | Status: AC
  Filled 2016-05-14: qty 20000

## 2016-05-14 MED ORDER — AMLODIPINE BESYLATE 10 MG PO TABS
10.0000 mg | ORAL_TABLET | Freq: Every day | ORAL | Status: DC
  Administered 2016-05-15 – 2016-05-17 (×3): 10 mg via ORAL
  Filled 2016-05-14 (×3): qty 1

## 2016-05-14 MED ORDER — LIDOCAINE HCL (PF) 1 % IJ SOLN
INTRAMUSCULAR | Status: DC | PRN
  Administered 2016-05-14: 30 mL

## 2016-05-14 MED ORDER — ALTEPLASE 2 MG IJ SOLR: 2.0000 mg | Freq: Once | INTRAMUSCULAR | Status: DC | PRN

## 2016-05-14 MED ORDER — LIDOCAINE-EPINEPHRINE 0.5 %-1:200000 IJ SOLN
INTRAMUSCULAR | Status: DC | PRN
  Administered 2016-05-14: 50 mL

## 2016-05-14 MED ORDER — POLYETHYLENE GLYCOL 3350 17 G PO PACK
17.0000 g | PACK | Freq: Every day | ORAL | Status: DC
  Administered 2016-05-17 – 2016-05-20 (×4): 17 g via ORAL
  Filled 2016-05-14 (×5): qty 1

## 2016-05-14 MED ORDER — SODIUM CHLORIDE 0.9 % IV SOLN
INTRAVENOUS | Status: DC | PRN
  Administered 2016-05-14: 12:00:00

## 2016-05-14 MED ORDER — OXYCODONE-ACETAMINOPHEN 5-325 MG PO TABS
1.0000 | ORAL_TABLET | ORAL | Status: DC | PRN
  Administered 2016-05-15 – 2016-05-23 (×4): 1 via ORAL
  Filled 2016-05-14 (×3): qty 1

## 2016-05-14 MED ORDER — 0.9 % SODIUM CHLORIDE (POUR BTL) OPTIME
TOPICAL | Status: DC | PRN
  Administered 2016-05-14: 1000 mL

## 2016-05-14 MED ORDER — SODIUM CHLORIDE 0.9 % IV SOLN: INTRAVENOUS | Status: DC

## 2016-05-14 MED ORDER — ATORVASTATIN CALCIUM 20 MG PO TABS
20.0000 mg | ORAL_TABLET | Freq: Every day | ORAL | Status: DC
  Administered 2016-05-14 – 2016-05-30 (×15): 20 mg via ORAL
  Filled 2016-05-14 (×14): qty 1

## 2016-05-14 MED ORDER — DEXTROSE 5 % IV SOLN
1.0000 g | INTRAVENOUS | Status: DC
  Administered 2016-05-14: 1 g via INTRAVENOUS
  Filled 2016-05-14 (×2): qty 1

## 2016-05-14 MED ORDER — FENTANYL CITRATE (PF) 100 MCG/2ML IJ SOLN
INTRAMUSCULAR | Status: DC | PRN
  Administered 2016-05-14: 50 ug via INTRAVENOUS
  Administered 2016-05-14 (×2): 25 ug via INTRAVENOUS

## 2016-05-14 MED ORDER — PAPAVERINE HCL 30 MG/ML IJ SOLN
INTRAMUSCULAR | Status: AC
Start: 2016-05-14 — End: 2016-05-14
  Filled 2016-05-14: qty 2

## 2016-05-14 MED ORDER — PROTAMINE SULFATE 10 MG/ML IV SOLN
INTRAVENOUS | Status: AC
  Filled 2016-05-14: qty 5

## 2016-05-14 MED ORDER — MIDAZOLAM HCL 2 MG/2ML IJ SOLN
INTRAMUSCULAR | Status: AC
  Filled 2016-05-14: qty 2

## 2016-05-14 MED ORDER — PROPOFOL 10 MG/ML IV BOLUS
INTRAVENOUS | Status: AC
  Filled 2016-05-14: qty 20

## 2016-05-14 MED ORDER — DOXYCYCLINE HYCLATE 100 MG PO TABS
100.0000 mg | ORAL_TABLET | Freq: Two times a day (BID) | ORAL | Status: DC
  Administered 2016-05-14 (×2): 100 mg via ORAL
  Filled 2016-05-14 (×2): qty 1

## 2016-05-14 MED ORDER — LIDOCAINE HCL (PF) 1 % IJ SOLN
INTRAMUSCULAR | Status: AC
Start: 2016-05-14 — End: 2016-05-14
  Filled 2016-05-14: qty 30

## 2016-05-14 MED ORDER — INSULIN ASPART 100 UNIT/ML ~~LOC~~ SOLN
0.0000 [IU] | Freq: Three times a day (TID) | SUBCUTANEOUS | Status: DC
  Administered 2016-05-14: 2 [IU] via SUBCUTANEOUS

## 2016-05-14 MED ORDER — ISOSORBIDE MONONITRATE ER 30 MG PO TB24
30.0000 mg | ORAL_TABLET | Freq: Every day | ORAL | Status: DC
  Administered 2016-05-14 – 2016-05-18 (×5): 30 mg via ORAL
  Filled 2016-05-14 (×5): qty 1

## 2016-05-14 MED ORDER — INSULIN ASPART 100 UNIT/ML ~~LOC~~ SOLN
0.0000 [IU] | Freq: Three times a day (TID) | SUBCUTANEOUS | Status: DC
  Administered 2016-05-14: 3 [IU] via SUBCUTANEOUS
  Administered 2016-05-15 (×2): 2 [IU] via SUBCUTANEOUS
  Administered 2016-05-16: 1 [IU] via SUBCUTANEOUS
  Administered 2016-05-16 – 2016-05-18 (×6): 2 [IU] via SUBCUTANEOUS
  Administered 2016-05-19: 3 [IU] via SUBCUTANEOUS
  Administered 2016-05-19: 2 [IU] via SUBCUTANEOUS
  Administered 2016-05-19: 3 [IU] via SUBCUTANEOUS
  Administered 2016-05-20 (×2): 2 [IU] via SUBCUTANEOUS
  Administered 2016-05-21: 3 [IU] via SUBCUTANEOUS
  Administered 2016-05-21 – 2016-05-22 (×2): 2 [IU] via SUBCUTANEOUS
  Administered 2016-05-22: 3 [IU] via SUBCUTANEOUS
  Administered 2016-05-23 – 2016-05-25 (×4): 2 [IU] via SUBCUTANEOUS
  Administered 2016-05-25: 1 [IU] via SUBCUTANEOUS
  Administered 2016-05-25 – 2016-05-26 (×3): 3 [IU] via SUBCUTANEOUS
  Administered 2016-05-26: 5 [IU] via SUBCUTANEOUS
  Administered 2016-05-27 (×2): 1 [IU] via SUBCUTANEOUS
  Administered 2016-05-28 (×2): 2 [IU] via SUBCUTANEOUS
  Administered 2016-05-28: 5 [IU] via SUBCUTANEOUS
  Administered 2016-05-29 – 2016-05-30 (×3): 2 [IU] via SUBCUTANEOUS
  Administered 2016-05-30: 5 [IU] via SUBCUTANEOUS
  Administered 2016-05-30: 3 [IU] via SUBCUTANEOUS
  Administered 2016-05-31: 1 [IU] via SUBCUTANEOUS
  Administered 2016-05-31: 2 [IU] via SUBCUTANEOUS

## 2016-05-14 MED ORDER — HEPARIN SODIUM (PORCINE) 1000 UNIT/ML IJ SOLN
INTRAMUSCULAR | Status: DC | PRN
  Administered 2016-05-14: 9000 [IU] via INTRAVENOUS

## 2016-05-14 MED ORDER — HEPARIN SODIUM (PORCINE) 1000 UNIT/ML DIALYSIS: 1000.0000 [IU] | INTRAMUSCULAR | Status: DC | PRN

## 2016-05-14 MED ORDER — LIDOCAINE HCL (PF) 1 % IJ SOLN: 5.0000 mL | INTRAMUSCULAR | Status: DC | PRN

## 2016-05-14 MED ORDER — PENTAFLUOROPROP-TETRAFLUOROETH EX AERO: 1.0000 "application " | INHALATION_SPRAY | CUTANEOUS | Status: DC | PRN

## 2016-05-14 MED ORDER — HEPARIN SODIUM (PORCINE) 1000 UNIT/ML IJ SOLN
INTRAMUSCULAR | Status: AC
  Filled 2016-05-14: qty 1

## 2016-05-14 MED ORDER — SODIUM CHLORIDE 0.9 % IV SOLN
INTRAVENOUS | Status: DC | PRN
  Administered 2016-05-14: 09:00:00 via INTRAVENOUS

## 2016-05-14 MED ORDER — LIDOCAINE HCL (PF) 1 % IJ SOLN
INTRAMUSCULAR | Status: AC
  Filled 2016-05-14: qty 30

## 2016-05-14 SURGICAL SUPPLY — 37 items
ARMBAND PINK RESTRICT EXTREMIT (MISCELLANEOUS) ×6 IMPLANT
CANISTER SUCT 3000ML PPV (MISCELLANEOUS) ×3 IMPLANT
CANNULA VESSEL 3MM 2 BLNT TIP (CANNULA) ×3 IMPLANT
CLIP TI MEDIUM 6 (CLIP) ×3 IMPLANT
CLIP TI WIDE RED SMALL 6 (CLIP) ×3 IMPLANT
COVER PROBE W GEL 5X96 (DRAPES) IMPLANT
DECANTER SPIKE VIAL GLASS SM (MISCELLANEOUS) ×3 IMPLANT
DERMABOND ADVANCED (GAUZE/BANDAGES/DRESSINGS) ×2
DERMABOND ADVANCED .7 DNX12 (GAUZE/BANDAGES/DRESSINGS) ×1 IMPLANT
ELECT REM PT RETURN 9FT ADLT (ELECTROSURGICAL) ×3
ELECTRODE REM PT RTRN 9FT ADLT (ELECTROSURGICAL) ×1 IMPLANT
GLOVE BIO SURGEON STRL SZ7.5 (GLOVE) ×3 IMPLANT
GLOVE BIOGEL PI IND STRL 6.5 (GLOVE) ×1 IMPLANT
GLOVE BIOGEL PI IND STRL 7.0 (GLOVE) ×3 IMPLANT
GLOVE BIOGEL PI IND STRL 8 (GLOVE) ×1 IMPLANT
GLOVE BIOGEL PI INDICATOR 6.5 (GLOVE) ×2
GLOVE BIOGEL PI INDICATOR 7.0 (GLOVE) ×6
GLOVE BIOGEL PI INDICATOR 8 (GLOVE) ×2
GLOVE SURG SS PI 6.5 STRL IVOR (GLOVE) ×3 IMPLANT
GOWN STRL REUS W/ TWL LRG LVL3 (GOWN DISPOSABLE) ×4 IMPLANT
GOWN STRL REUS W/TWL LRG LVL3 (GOWN DISPOSABLE) ×8
GRAFT GORETEX STRT 4-7X45 (Vascular Products) ×3 IMPLANT
KIT BASIN OR (CUSTOM PROCEDURE TRAY) ×3 IMPLANT
KIT ROOM TURNOVER OR (KITS) ×3 IMPLANT
NEEDLE HYPO 25GX1X1/2 BEV (NEEDLE) ×3 IMPLANT
NS IRRIG 1000ML POUR BTL (IV SOLUTION) ×3 IMPLANT
PACK CV ACCESS (CUSTOM PROCEDURE TRAY) ×3 IMPLANT
PAD ARMBOARD 7.5X6 YLW CONV (MISCELLANEOUS) ×6 IMPLANT
SLEEVE SURGEON STRL (DRAPES) ×3 IMPLANT
SPONGE SURGIFOAM ABS GEL 100 (HEMOSTASIS) IMPLANT
SUT PROLENE 6 0 BV (SUTURE) ×12 IMPLANT
SUT VIC AB 3-0 SH 27 (SUTURE) ×4
SUT VIC AB 3-0 SH 27X BRD (SUTURE) ×2 IMPLANT
SUT VICRYL 4-0 PS2 18IN ABS (SUTURE) ×6 IMPLANT
SYR CONTROL 10ML LL (SYRINGE) ×3 IMPLANT
UNDERPAD 30X30 (UNDERPADS AND DIAPERS) ×3 IMPLANT
WATER STERILE IRR 1000ML POUR (IV SOLUTION) ×3 IMPLANT

## 2016-05-14 NOTE — Progress Notes (Signed)
Subjective: Interval History: has no complaint, today is better.  Objective: Vital signs in last 24 hours: Temp:  [98.5 F (36.9 C)-99.2 F (37.3 C)] 99 F (37.2 C) (04/03 1338) Pulse Rate:  [90-99] 97 (04/03 1338) Resp:  [15-21] 16 (04/03 1338) BP: (126-172)/(60-89) 157/83 (04/03 1338) SpO2:  [92 %-100 %] 97 % (04/03 1338) Weight:  [122 kg (268 lb 15.4 oz)] 122 kg (268 lb 15.4 oz) (04/02 2129) Weight change: -9 kg (-19 lb 13.5 oz)  Intake/Output from previous day: 04/02 0701 - 04/03 0700 In: 557 [P.O.:220; NG/GT:237; IV Piggyback:100] Out: 2860 [Urine:550] Intake/Output this shift: Total I/O In: 300 [I.V.:300] Out: 95 [Urine:75; Blood:20]  General appearance: cooperative, no distress, morbidly obese and slowed mentation Resp: diminished breath sounds bilaterally Chest wall: RIJ cath Cardio: S1, S2 normal and systolic murmur: holosystolic 2/6, blowing at apex GI: massive, pos bs, soft Extremitie2+ pitting,, also chronic changes , AVG LUAedema 2+ pitting,, also chronic changes , AVG LUA  Lab Results:  Recent Labs  05/13/16 0735 05/14/16 0712  WBC 15.0* 15.7*  HGB 8.5* 8.7*  HCT 28.7* 28.9*  PLT 251 254   BMET:  Recent Labs  05/12/16 1611 05/14/16 0712  NA 141 137  K 5.5* 4.3  CL 102 98*  CO2 28 27  GLUCOSE 224* 179*  BUN 54* 36*  CREATININE 4.11* 3.17*  CALCIUM 9.4 9.0   No results for input(s): PTH in the last 72 hours. Iron Studies: No results for input(s): IRON, TIBC, TRANSFERRIN, FERRITIN in the last 72 hours.  Studies/Results: No results found.  I have reviewed the patient's current medications.  Assessment/Plan: 1 ESRD for HD in am . Lower vol.  2 Debill mobilize 3 Anemia esa/Fe 4 HPTH vit D 5 leg ulcers 6 massive obesity 7 DM controlled 8 HTN lower vol and meds P Hd, esa, lower vol lower bp meds    LOS: 13 days   Annetta Deiss L 05/14/2016,4:03 PM

## 2016-05-14 NOTE — Progress Notes (Signed)
[facility]                                                                              Patient Demographics  [name], is a 41 y.o. female, DOB - Jan 04, 1976, ZOX:[id number]  Admit date - 05/01/2016   Admitting Physician Costin Otelia Sergeant, MD  Outpatient Primary MD for the patient is Marletta Lor, NP  Outpatient specialists:   LOS - 13  days    Chief Complaint  Patient presents with  . wounds       Brief summary  41 y.o.femalewith medical history significant for morbid obesity, chronic kidney disease stage IV-V, diabetes mellitus, hypertension, prior CVA, diastolic CHF who presented to ED with progressive weakness, fever for past 1 week prior to this admission. She is actually bed bound for past year or so per her husband report ever since stroke. She also had dry cough and shortness of breath for past 1 week. She was hemodynamically stable on admission. Her creatinine was 7.9 (much worse since her recent Cr values of 4-5). Renal has seen her in consultation. Due to her debility she is not a good candidate for HD. Palliative care was consulted as well.      Assessment & Plan   Acute metabolic encephalopathy - Improving, at baseline mental status - Likely multifactorial from history of CVA, end stage renal disease, possible uremia, acute infectious process - possible pneumonia  Chest pain episode  -  Troponin 1 was 0.04 however d-dimer was 2.01 - VQ scan Negative for any PE no further chest pain episodes  Acute renal failure superimposed on CKD stage 5 not on HD / Acute pulmonary edema  - Due to progressive disease from uncontrolled diabetes and FSG. Patient was placed on aggressive IV Lasix and metolazone per renal recommendations. - Tunneled dialysis cath Placed on 3/27. Hemodialysis initiated 3/28, then 3/29, HD 4/1, HD 4/2 - Accepted at [facility] [address] .Start Date and Time 1st treatment Wednesday 05/15/16 at 12:00 pm,  tentative schedule MWF  however patient is still very weak, not motivated and unable to sit in the chair - Vascular surgery also following, AV graft placement today  Acute respiratory failure with hypoxia / Right middle lobe pneumonia / Leukocytosis  - CT chest without contrast showed linear density over the right middle lobe likely atelectasis, although cannot completely exclude infection. Patient was placed on empiric doxycycline - Blood cultures 1/2 showed coagnegative staph likely contaminant  -Fevers now improving, leukocytosis trending down, continue Fortaz IV, doxycycline   Anemia of chronic disease / IDA - Due to CKD - Status post 2 units PRBC transfusion on 05/02/2016  - Aranesp 3/22, continue iron supplementation, B12  Uncontrolled diabetes mellitus with diabetic nephropathy with long term insulin use - Continue current insulin regimen: SSI - CBGs elevated, added 5 units Lantus at bedtime  Dyslipidemia associated with type 2 DM - Continue statin therapy   Essential hypertension - Continue Norvasc, coreg, hydralazine, Imdur   Bilateral inner thigh wounds - Related to moisture and friction from thighs rubbing together - Left inner thigh has large area with many open areas scattered  throughout, 17cm x 12cm x 0.1cm  - Right inner thigh wound is 8cm x 5cm x 0.3cm  - Dressing procedure/placement/frequency: Cleanse with NS, gently pat dry, apply Xeroform gauze, top with dry gauze,change every shift. Pt is already on a low air loss mattress - Fevers improving, leukocytosis trending down, continue Fortaz IV, doxycycline   Morbid obesity / Debility  - Nutrition consulted - Body mass index is 54.58 kg/m.  Code Status: full  DVT Prophylaxis:  heparin  Family Communication: Discussed in detail with the patient, all imaging results, lab results explained to the patient.   Disposition Plan: When cleared by nephrology  Time Spent in minutes 25 minutes  Procedures:    Hemodialysis is initiated 3/28  Consultants:    Nephrology  WOC  Palliative care   Nutrition  Vascular surgery  Antimicrobials:  Doxycycline 3/21->  Elita Quick as started on 4/1  Medications  Scheduled Meds: . albuterol  3 mL Inhalation Q M,W,F-HD  . amLODipine  10 mg Oral Daily  . atorvastatin  20 mg Oral Daily  . calcium acetate  667 mg Oral TID WC  . carvedilol  25 mg Oral BID WC  .  ceFAZolin (ANCEF) IV  1 g Intravenous On Call  . cefTAZidime (FORTAZ)  IV  1 g Intravenous Q24H  . [START ON 05/17/2016] darbepoetin (ARANESP) injection - DIALYSIS  150 mcg Intravenous Q Fri-HD  . doxycycline  100 mg Oral Q12H  . feeding supplement (NEPRO CARB STEADY)  237 mL Oral TID BM  . ferric gluconate (FERRLECIT/NULECIT) IV  125 mg Intravenous Q M,W,F-HD  . gi cocktail  30 mL Oral Once  . [START ON 05/15/2016] heparin  5,000 Units Subcutaneous Q8H  . hydrALAZINE  50 mg Oral TID  . insulin aspart  0-5 Units Subcutaneous QHS  . insulin aspart  0-9 Units Subcutaneous TID WC  . insulin glargine  5 Units Subcutaneous QHS  . isosorbide mononitrate  30 mg Oral Daily  . multivitamin  1 tablet Oral QHS  . polyethylene glycol  17 g Oral Daily  . senna-docusate  1 tablet Oral BID   Continuous Infusions:  PRN Meds:.acetaminophen, albuterol, camphor-menthol, guaiFENesin-dextromethorphan, HYDROcodone-acetaminophen, morphine injection, nitroGLYCERIN, ondansetron **OR** ondansetron (ZOFRAN) IV, oxyCODONE-acetaminophen   Antibiotics   Anti-infectives    Start     Dose/Rate Route Frequency Ordered Stop   05/14/16 1430  cefTAZidime (FORTAZ) 1 g in dextrose 5 % 50 mL IVPB     1 g 100 mL/hr over 30 Minutes Intravenous Every 24 hours 05/14/16 1406     05/14/16 1430  doxycycline (VIBRA-TABS) tablet 100 mg     100 mg Oral Every 12 hours 05/14/16 1406     05/14/16 0000  ceFAZolin (ANCEF) IVPB 1 g/50 mL premix    Comments:  Send with pt to OR   1 g 100 mL/hr over 30 Minutes Intravenous On call  05/13/16 1630 05/15/16 0000   05/12/16 1330  cefTAZidime (FORTAZ) 1 g in dextrose 5 % 50 mL IVPB  Status:  Discontinued     1 g 100 mL/hr over 30 Minutes Intravenous Every 24 hours 05/12/16 1319 05/14/16 1406   05/08/16 1000  vancomycin (VANCOCIN) IVPB 1000 mg/200 mL premix     1,000 mg 200 mL/hr over 60 Minutes Intravenous To Surgery 05/07/16 1904 05/08/16 1004   05/08/16 0902  vancomycin (VANCOCIN) 1-5 GM/200ML-% IVPB    Comments:  Matilde Haymaker, [name]   : cabinet override      05/08/16 0902 05/08/16 0865  05/07/16 1300  ceFAZolin (ANCEF) 3 g in dextrose 5 % 50 mL IVPB     3 g 130 mL/hr over 30 Minutes Intravenous To Radiology 05/07/16 1232 05/07/16 1330   05/02/16 2200  doxycycline (VIBRA-TABS) tablet 100 mg  Status:  Discontinued     100 mg Oral Every 12 hours 05/02/16 1732 05/14/16 1406   05/01/16 2200  piperacillin-tazobactam (ZOSYN) IVPB 2.25 g  Status:  Discontinued     2.25 g 100 mL/hr over 30 Minutes Intravenous Every 8 hours 05/01/16 1324 05/02/16 1729   05/01/16 1330  vancomycin (VANCOCIN) 2,500 mg in sodium chloride 0.9 % 500 mL IVPB     2,500 mg 250 mL/hr over 120 Minutes Intravenous  Once 05/01/16 1309 05/01/16 1551   05/01/16 1300  piperacillin-tazobactam (ZOSYN) IVPB 3.375 g     3.375 g 100 mL/hr over 30 Minutes Intravenous  Once 05/01/16 1258 05/01/16 1340   05/01/16 1300  vancomycin (VANCOCIN) IVPB 1000 mg/200 mL premix  Status:  Discontinued     1,000 mg 200 mL/hr over 60 Minutes Intravenous  Once 05/01/16 1258 05/01/16 1308        Subjective:   [name] was seen and examined today. Denies any specific complaints except nausea.  Patient denies dizziness, shortness of breath, abdominal pain, N/V/D/C, new weakness, numbess, tingling.   Objective:   Vitals:   05/14/16 1245 05/14/16 1300 05/14/16 1315 05/14/16 1338  BP: (!) 169/89 (!) 172/89  (!) 157/83  Pulse: 94 94 95 97  Resp: (!) 21 20 20 16   Temp:   98.8 F (37.1 C) 99 F (37.2 C)    TempSrc:    Oral  SpO2: 97% 97% 97% 97%  Weight:      Height:        Intake/Output Summary (Last 24 hours) at 05/14/16 1436 Last data filed at 05/14/16 1338  Gross per 24 hour  Intake              637 ml  Output             2605 ml  Net            -1968 ml     Wt Readings from Last 3 Encounters:  05/13/16 122 kg (268 lb 15.4 oz)  03/29/16 (!) 167 kg (368 lb 3.2 oz)  03/09/16 (!) 168.3 kg (371 lb)     Exam  General: Alert and oriented 3 NAD  HEENT:  Neck: Supple,   Cardiovascular: S1 S2 clear, RRR  Respiratory: CTAB  Gastrointestinal: Morbidly obese, soft, NT, ND  Ext: no cyanosis clubbing or edema,  Neuro: no new deficits  Skin: Dressing intact on the inner thighs  Psych: Normal affect and demeanor, alert and oriented x3    Data Reviewed:  I have personally reviewed following labs and imaging studies  Micro Results Recent Results (from the past 240 hour(s))  MRSA PCR Screening     Status: None   Collection Time: 05/13/16 11:26 AM  Result Value Ref Range Status   MRSA by PCR NEGATIVE NEGATIVE Final    Comment:        The GeneXpert MRSA Assay (FDA approved for NASAL specimens only), is one component of a comprehensive MRSA colonization surveillance program. It is not intended to diagnose MRSA infection nor to guide or monitor treatment for MRSA infections.     Radiology Reports Dg Chest 2 View  Result Date: 05/01/2016 CLINICAL DATA:  Fever and weakness. EXAM: CHEST  2  VIEW COMPARISON:  03/22/2016. FINDINGS: Cardiomegaly with mild bilateral interstitial prominence. No pleural effusion or pneumothorax. No acute bony abnormality. IMPRESSION: Cardiomegaly with mild bilateral interstitial prominence consistent with CHF. Findings have improved from prior study of 03/22/2016. Given patient's history of fever pneumonitis cannot be excluded. Electronically Signed   By: Maisie Fus  Register   On: 05/01/2016 13:30   Ct Chest Wo Contrast  Result Date:  05/02/2016 CLINICAL DATA:  Morbid obesity week chronic kidney disease and diabetes. Complains of weakness and fever over the past week. Cough. EXAM: CT CHEST WITHOUT CONTRAST TECHNIQUE: Multidetector CT imaging of the chest was performed following the standard protocol without IV contrast. COMPARISON:  Chest x-ray 05/01/2016 FINDINGS: Cardiovascular: Mild to moderate cardiomegaly. Remaining vascular structures are unremarkable. Mediastinum/Nodes: No definite mediastinal or hilar adenopathy. Remaining mediastinal structures are unremarkable. Lungs/Pleura: Lungs are somewhat hypoinflated and demonstrate mild bibasilar linear atelectasis. Linear density over the posterior and anterior right middle lobe likely atelectasis. No evidence of effusion. Airways are unremarkable. Upper Abdomen: Mild calcified plaque over the celiac axis, splenic artery and superior mesenteric arteries. Musculoskeletal: Minimal degenerative change of the spine. Prominent overlying soft tissues. IMPRESSION: Linear density over the right middle lobe likely atelectasis, although cannot completely exclude infection. Minimal linear bibasilar atelectasis. Moderate cardiomegaly. Electronically Signed   By: Elberta Fortis M.D.   On: 05/02/2016 15:53   Nm Pulmonary Perf And Vent  Result Date: 05/06/2016 CLINICAL DATA:  Chest pain EXAM: NUCLEAR MEDICINE VENTILATION - PERFUSION LUNG SCAN VIEWS: Anterior, posterior, RAO, LAO, RPO, LPO-ventilation and profusion. RADIOPHARMACEUTICALS:  32.6 mCi Technetium-73m DTPA aerosol inhalation and 4.2 mCi Technetium-79m MAA IV COMPARISON:  Chest radiograph May 06, 2016 FINDINGS: Ventilation: Radiotracer uptake is homogeneous and symmetric. No ventilation defects are evident. Perfusion: Radiotracer uptake is homogeneous and symmetric bilaterally. No perfusion defects evident. Cardiomegaly noted. IMPRESSION: No appreciable ventilation or perfusion defects. This study constitutes a very low probability of pulmonary  embolus. Cardiomegaly noted. Electronically Signed   By: Bretta Bang III M.D.   On: 05/06/2016 14:19   Ir Fluoro Guide Cv Line Right  Result Date: 05/07/2016 INDICATION: End-stage renal disease and starting hemodialysis. EXAM: FLUOROSCOPIC AND ULTRASOUND GUIDED PLACEMENT OF A TUNNELED DIALYSIS CATHETER Physician: Rachelle Hora. Lowella Dandy, MD MEDICATIONS: Ancef 3 g; The antibiotic was administered within an appropriate time interval prior to skin puncture. ANESTHESIA/SEDATION: Versed 2.0 mg IV; Fentanyl 100 mcg IV; Moderate Sedation Time:  20 minutes The patient was continuously monitored during the procedure by the interventional radiology nurse under my direct supervision. FLUOROSCOPY TIME:  Fluoroscopy Time: 24 seconds, 2.5 mGy COMPLICATIONS: None immediate. PROCEDURE: Informed consent was obtained for placement of a tunneled dialysis catheter. The patient was placed supine on the interventional table. Ultrasound confirmed a patent right internal jugular vein. Ultrasound images were obtained for documentation. The right side of the neck was prepped and draped in a sterile fashion. The right side of the neck was anesthetized with 1% lidocaine. Maximal barrier sterile technique was utilized including caps, mask, sterile gowns, sterile gloves, sterile drape, hand hygiene and skin antiseptic. A small incision was made with #11 blade scalpel. A 21 gauge needle directed into the right internal jugular vein with ultrasound guidance. A micropuncture dilator set was placed. A 23 cm tip to cuff Palindrome catheter was selected. The skin below the right clavicle was anesthetized and a small incision was made with an #11 blade scalpel. A subcutaneous tunnel was formed to the vein dermatotomy site. The catheter was brought through the tunnel. The  vein dermatotomy site was dilated to accommodate a peel-away sheath. The catheter was placed through the peel-away sheath and directed into the central venous structures. The tip of the  catheter was placed at the superior cavoatrial junction with fluoroscopy. Fluoroscopic images were obtained for documentation. Both lumens were found to aspirate and flush well. The proper amount of heparin was flushed in both lumens. The vein dermatotomy site was closed using a single layer of absorbable suture and Dermabond. Gel-Foam placed in the subcutaneous tract. The catheter was secured to the skin using Prolene suture. IMPRESSION: Successful placement of a right jugular tunneled dialysis catheter using ultrasound and fluoroscopic guidance. Electronically Signed   By: Richarda Overlie M.D.   On: 05/07/2016 14:33   Ir US Guide Vasc Access Right  Result Date: 05/07/2016 INDICATION: End-stage renal disease and starting hemodialysis. EXAM: FLUOROSCOPIC AND ULTRASOUND GUIDED PLACEMENT OF A TUNNELED DIALYSIS CATHETER Physician: Rachelle Hora. Lowella Dandy, MD MEDICATIONS: Ancef 3 g; The antibiotic was administered within an appropriate time interval prior to skin puncture. ANESTHESIA/SEDATION: Versed 2.0 mg IV; Fentanyl 100 mcg IV; Moderate Sedation Time:  20 minutes The patient was continuously monitored during the procedure by the interventional radiology nurse under my direct supervision. FLUOROSCOPY TIME:  Fluoroscopy Time: 24 seconds, 2.5 mGy COMPLICATIONS: None immediate. PROCEDURE: Informed consent was obtained for placement of a tunneled dialysis catheter. The patient was placed supine on the interventional table. Ultrasound confirmed a patent right internal jugular vein. Ultrasound images were obtained for documentation. The right side of the neck was prepped and draped in a sterile fashion. The right side of the neck was anesthetized with 1% lidocaine. Maximal barrier sterile technique was utilized including caps, mask, sterile gowns, sterile gloves, sterile drape, hand hygiene and skin antiseptic. A small incision was made with #11 blade scalpel. A 21 gauge needle directed into the right internal jugular vein with  ultrasound guidance. A micropuncture dilator set was placed. A 23 cm tip to cuff Palindrome catheter was selected. The skin below the right clavicle was anesthetized and a small incision was made with an #11 blade scalpel. A subcutaneous tunnel was formed to the vein dermatotomy site. The catheter was brought through the tunnel. The vein dermatotomy site was dilated to accommodate a peel-away sheath. The catheter was placed through the peel-away sheath and directed into the central venous structures. The tip of the catheter was placed at the superior cavoatrial junction with fluoroscopy. Fluoroscopic images were obtained for documentation. Both lumens were found to aspirate and flush well. The proper amount of heparin was flushed in both lumens. The vein dermatotomy site was closed using a single layer of absorbable suture and Dermabond. Gel-Foam placed in the subcutaneous tract. The catheter was secured to the skin using Prolene suture. IMPRESSION: Successful placement of a right jugular tunneled dialysis catheter using ultrasound and fluoroscopic guidance. Electronically Signed   By: Richarda Overlie M.D.   On: 05/07/2016 14:33   Dg Chest Port 1 View  Result Date: 05/06/2016 CLINICAL DATA:  Acute onset of generalized chest pain. Initial encounter. EXAM: PORTABLE CHEST 1 VIEW COMPARISON:  Chest radiograph performed 05/01/2016, and CT of the chest performed 05/02/2016 FINDINGS: The lungs are hypoexpanded. Vascular congestion and vascular crowding are noted. Increased interstitial markings may reflect mild interstitial edema. There is no evidence of pleural effusion or pneumothorax. The cardiomediastinal silhouette is mildly enlarged. No acute osseous abnormalities are seen. IMPRESSION: Lungs hypoexpanded. Vascular congestion and mild cardiomegaly. Increased interstitial markings could reflect mild interstitial edema. Electronically  Signed   By: Roanna Raider M.D.   On: 05/06/2016 02:48    Lab  Data:  CBC:  Recent Labs Lab 05/08/16 0720 05/09/16 1030 05/11/16 0746 05/13/16 0735 05/14/16 0712  WBC 11.3* 12.2* 17.2* 15.0* 15.7*  HGB 8.7* 8.4* 8.4* 8.5* 8.7*  HCT 27.2* 27.4* 27.8* 28.7* 28.9*  MCV 87.2 88.7 90.0 90.8 90.9  PLT 423* 339 299 251 254   Basic Metabolic Panel:  Recent Labs Lab 05/08/16 0720 05/09/16 1030 05/11/16 0747 05/12/16 1611 05/14/16 0712  NA 142 142 146* 141 137  K 3.7 3.9 4.3 5.5* 4.3  CL 98* 100* 104 102 98*  CO2 29 28 28 28 27   GLUCOSE 169* 155* 182* 224* 179*  BUN 131* 94* 74* 54* 36*  CREATININE 6.59* 5.64* 5.07* 4.11* 3.17*  CALCIUM 8.7* 8.8* 9.5 9.4 9.0  PHOS  --  5.1* 4.3 3.5  --    GFR: Estimated Creatinine Clearance: 31.7 mL/min (A) (by C-G formula based on SCr of 3.17 mg/dL (H)). Liver Function Tests:  Recent Labs Lab 05/09/16 1030 05/11/16 0747 05/12/16 1611  ALBUMIN 2.0* 2.1* 2.1*   No results for input(s): LIPASE, AMYLASE in the last 168 hours. No results for input(s): AMMONIA in the last 168 hours. Coagulation Profile:  Recent Labs Lab 05/08/16 0720  INR 1.90   Cardiac Enzymes: No results for input(s): CKTOTAL, CKMB, CKMBINDEX, TROPONINI in the last 168 hours. BNP (last 3 results) No results for input(s): PROBNP in the last 8760 hours. HbA1C: No results for input(s): HGBA1C in the last 72 hours. CBG:  Recent Labs Lab 05/13/16 1831 05/13/16 2122 05/14/16 0805 05/14/16 1234 05/14/16 1341  GLUCAP 138* 145* 183* 158* 164*   Lipid Profile: No results for input(s): CHOL, HDL, LDLCALC, TRIG, CHOLHDL, LDLDIRECT in the last 72 hours. Thyroid Function Tests: No results for input(s): TSH, T4TOTAL, FREET4, T3FREE, THYROIDAB in the last 72 hours. Anemia Panel: No results for input(s): VITAMINB12, FOLATE, FERRITIN, TIBC, IRON, RETICCTPCT in the last 72 hours. Urine analysis:    Component Value Date/Time   COLORURINE STRAW (A) 05/01/2016 1144   APPEARANCEUR CLEAR 05/01/2016 1144   LABSPEC 1.009 05/01/2016  1144   PHURINE 5.0 05/01/2016 1144   GLUCOSEU NEGATIVE 05/01/2016 1144   HGBUR SMALL (A) 05/01/2016 1144   BILIRUBINUR NEGATIVE 05/01/2016 1144   KETONESUR NEGATIVE 05/01/2016 1144   PROTEINUR 30 (A) 05/01/2016 1144   UROBILINOGEN 0.2 10/26/2013 1721   NITRITE NEGATIVE 05/01/2016 1144   LEUKOCYTESUR NEGATIVE 05/01/2016 1144     RAI,RIPUDEEP M.D. [facility] 05/14/2016, 2:36 PM  Pager: [phone] Between 7am to 7pm - call Pager - [phone]  After 7pm go to [email or web address] - password [email or web address]  Call night coverage person covering after 7pm

## 2016-05-14 NOTE — Discharge Instructions (Signed)
° ° °  05/14/2016 [name] [id number] 07-20-1975  Surgeon(s): Chuck Hint, MD  Procedure(s): ARTERIOVENOUS (AV) LEFT UPPER ARM USING GORETEX STRETCHED VASCULAR GRAFT  x Do not stick graft for 4 weeks

## 2016-05-14 NOTE — H&P (View-Only) (Signed)
[id number] Patient ID: [name], female   DOB: 09-24-1975, 41 y.o.   MRN: [id number] The patient is currently on hemodialysis. Her right IJ catheter. She was seen in consultation last week far service. Was scheduled for a left arm AV access. The patient decided the morning of in the holding area that she did not want to have surgery. We have now been requested to place new access. Discussed plan for left arm AV fistula versus AV graft. She states that she is willing to proceed tomorrow. Has been scheduled for Dr. Edilia Bo tomorrow

## 2016-05-14 NOTE — Progress Notes (Signed)
PT Cancellation Note  Patient Details Name: [name] MRN: [id number] DOB: Mar 15, 1975   Cancelled Treatment:    Reason Eval/Treat Not Completed: Patient at procedure or test/unavailable Pt currently in OR. Will reattempt as schedule allows.   [name], PT, DPT  Acute Rehabilitation Services  Pager: [phone]  [name] 05/14/2016, 11:06 AM

## 2016-05-14 NOTE — Op Note (Signed)
    NAME: [name]  MRN: [id number] DOB: Aug 24, 1975    DATE OF OPERATION: 05/14/2016  PREOP DIAGNOSIS: End-stage renal disease  POSTOP DIAGNOSIS: Same  PROCEDURE: New left upper arm loop AV graft (4-7 mm PTFE graft)  SURGEON: Di Kindle. Edilia Bo, MD, FACS  ASSIST: Doreatha Massed, PA  ANESTHESIA:   local with sedation  EBL:  Minimal  INDICATIONS: [name] is a 41 y.o. female  who presents for new access. Vein map shows that she is not a candidate for a fistula. Her antecubital veins were very small and therefore elected to place an upper arm loop.  FINDINGS:  Good thrill in the graft with a radial and ulnar signal with the Doppler at the completion.  TECHNIQUE:  The patient was taken to the operating room and was sedated by anesthesia. The left upper extremity was prepped and draped in usual sterile fashion. After the skin was anesthetized with 1% lidocaine, a longitudinal incision was made in the axilla and dissection carried down to the axillary artery and the adjacent axillary vein. Using one distal counter incision a 4-7 mm PTFE graft was tunneled in a loop fashion in the upper arm with the arterial aspect of the graft laterally. The patient was heparinized. The axillary artery was Proximally and distally and longitudinal arteriotomy was made. A segment of the 4 mm end of the graft was excised, the graft slightly spatulated, and sewn end-to-side to the axillary artery using continuous 6-0 Prolene suture. The grafts and the appropriate length for anastomosis to the axillary vein. The vein was ligated distally and spatulated proximally. The graft was cut the appropriate length, spatulated, and sewn into in to the vein using continuous 6-0 Prolene suture. At the completion of his good thrill in the graft and the radial and ulnar signal with the Doppler. Heparin was partially reversed with protamine. The wounds were closed with a clear field, skin closed with 4-0  Vicryl. Sterile dressing was applied. The patient tolerated the procedure well and was transferred to the recovery room in stable condition. All needle and sponge count were correct.  Waverly Ferrari, MD, FACS [facility]  DATE OF DICTATION:   05/14/2016

## 2016-05-14 NOTE — Anesthesia Procedure Notes (Signed)
Procedure Name: MAC Date/Time: 05/14/2016 10:35 AM Performed by: Jenne Campus Pre-anesthesia Checklist: Patient identified, Emergency Drugs available, Suction available, Patient being monitored and Timeout performed Oxygen Delivery Method: Simple face mask

## 2016-05-14 NOTE — Interval H&P Note (Signed)
History and Physical Interval Note:  05/14/2016 9:41 AM  [name]  has presented today for surgery, with the diagnosis of End stage renal disease N18.6  The various methods of treatment have been discussed with the patient and family. After consideration of risks, benefits and other options for treatment, the patient has consented to  Procedure(s): ARTERIOVENOUS (AV) FISTULA CREATION VERSUS GRAFT INSERTION (Left) as a surgical intervention .  The patient's history has been reviewed, patient examined, no change in status, stable for surgery.  I have reviewed the patient's chart and labs.  Questions were answered to the patient's satisfaction.     Waverly Ferrari

## 2016-05-14 NOTE — Anesthesia Preprocedure Evaluation (Addendum)
Anesthesia Evaluation  Patient identified by MRN, date of birth, ID band Patient awake    Reviewed: Allergy & Precautions, NPO status , Patient's Chart, lab work & pertinent test results, reviewed documented beta blocker date and time   Airway Mallampati: II  TM Distance: >3 FB Neck ROM: Full    Dental  (+) Teeth Intact, Dental Advisory Given   Pulmonary asthma ,    Pulmonary exam normal breath sounds clear to auscultation       Cardiovascular hypertension, Pt. on home beta blockers and Pt. on medications +CHF  Normal cardiovascular exam Rhythm:Regular Rate:Normal  Echo 03/22/16: Study Conclusions  - Procedure narrative: Transthoracic echocardiography. Image quality was suboptimal. The study was technically difficult, as a result of poor acoustic windows, poor sound wave transmission, restricted patient mobility, and body habitus. Intravenous contrast (Definity) was administered. - Left ventricle: The cavity size was normal. Wall thickness was increased in a pattern of moderate LVH. There was moderate concentric hypertrophy. Systolic function was normal. The estimated ejection fraction was in the range of 55% to 60%. Wall motion was normal; there were no regional wall motion abnormalities. Features are consistent with a pseudonormal left   ventricular filling pattern, with concomitant abnormal relaxation and increased filling pressure (grade 2 diastolic dysfunction). Doppler parameters are consistent with elevated ventricular end-diastolic filling pressure. - Mitral valve: There was mild regurgitation. - Left atrium: The atrium was moderately dilated. - Right ventricle: The cavity size was mildly dilated. Wall   thickness was normal.  Impressions:  - LVEF 55-60%. Moderate LVH. Grade 2 diastolic dysfunction with elevated LVEDP. There is small mobile echodensity attached to the left coronary cusp that is more prominent than previously  described nodule of   Arantius. Mitral valve leaflet are thickened.     If there is a clinical suspicion for an endocarditis a TEE should be considered.   Neuro/Psych CVA, Residual Symptoms negative psych ROS   GI/Hepatic Neg liver ROS, GERD  Medicated,  Endo/Other  diabetes, Type 2Morbid obesity  Renal/GU ESRF and DialysisRenal disease     Musculoskeletal negative musculoskeletal ROS (+)   Abdominal   Peds  Hematology  (+) Blood dyscrasia, anemia ,   Anesthesia Other Findings Day of surgery medications reviewed with the patient.  Reproductive/Obstetrics negative OB ROS                            Anesthesia Physical Anesthesia Plan  ASA: IV  Anesthesia Plan: MAC   Post-op Pain Management:    Induction: Intravenous  Airway Management Planned: Nasal Cannula  Additional Equipment:   Intra-op Plan:   Post-operative Plan:   Informed Consent: I have reviewed the patients History and Physical, chart, labs and discussed the procedure including the risks, benefits and alternatives for the proposed anesthesia with the patient or authorized representative who has indicated his/her understanding and acceptance.   Dental advisory given  Plan Discussed with: CRNA and Anesthesiologist  Anesthesia Plan Comments: (Discussed risks/benefits/alternatives to MAC sedation including need for ventilatory support, hypotension, need for conversion to general anesthesia.  All patient questions answered.  Patient/guardian wishes to proceed.)        Anesthesia Quick Evaluation

## 2016-05-14 NOTE — Transfer of Care (Signed)
Immediate Anesthesia Transfer of Care Note  Patient: [name]  Procedure(s) Performed: Procedure(s): ARTERIOVENOUS (AV) LEFT UPPER ARM USING GORETEX STRETCHED VASCULAR GRAFT (Left)  Patient Location: PACU  Anesthesia Type:MAC  Level of Consciousness: awake, oriented and patient cooperative  Airway & Oxygen Therapy: Patient Spontanous Breathing and Patient connected to face mask oxygen  Post-op Assessment: Report given to RN and Post -op Vital signs reviewed and stable  Post vital signs: Reviewed  Last Vitals:  Vitals:   05/14/16 0612 05/14/16 0911  BP: (!) 164/73 (!) 154/77  Pulse: 96 99  Resp: 15 18  Temp: 37.3 C 37 C    Last Pain:  Vitals:   05/14/16 0911  TempSrc: Oral  PainSc:       Patients Stated Pain Goal: 3 (11/10[birth year] 4734)  Complications: No apparent anesthesia complications

## 2016-05-14 NOTE — Progress Notes (Signed)
Patient agreed to leave belongings at bedside while in surgery. Patient accepted responsibility if anything happened to belongings.

## 2016-05-15 ENCOUNTER — Encounter (HOSPITAL_COMMUNITY): Payer: Self-pay | Admitting: Vascular Surgery

## 2016-05-15 LAB — CBC
HCT: 26.4 % — ABNORMAL LOW (ref 36.0–46.0)
Hemoglobin: 8.5 g/dL — ABNORMAL LOW (ref 12.0–15.0)
MCH: 29.2 pg (ref 26.0–34.0)
MCHC: 32.2 g/dL (ref 30.0–36.0)
MCV: 90.7 fL (ref 78.0–100.0)
PLATELETS: 245 10*3/uL (ref 150–400)
RBC: 2.91 MIL/uL — ABNORMAL LOW (ref 3.87–5.11)
RDW: 15 % (ref 11.5–15.5)
WBC: 15 10*3/uL — ABNORMAL HIGH (ref 4.0–10.5)

## 2016-05-15 LAB — GLUCOSE, CAPILLARY
GLUCOSE-CAPILLARY: 193 mg/dL — AB (ref 65–99)
Glucose-Capillary: 171 mg/dL — ABNORMAL HIGH (ref 65–99)
Glucose-Capillary: 171 mg/dL — ABNORMAL HIGH (ref 65–99)

## 2016-05-15 MED ORDER — CARVEDILOL 6.25 MG PO TABS
6.2500 mg | ORAL_TABLET | Freq: Two times a day (BID) | ORAL | Status: DC
  Administered 2016-05-15 – 2016-05-18 (×5): 6.25 mg via ORAL
  Filled 2016-05-15 (×5): qty 1

## 2016-05-15 NOTE — Anesthesia Postprocedure Evaluation (Signed)
Anesthesia Post Note  Patient: [name]  Procedure(s) Performed: Procedure(s) (LRB): ARTERIOVENOUS (AV) LEFT UPPER ARM USING GORETEX STRETCHED VASCULAR GRAFT (Left)  Patient location during evaluation: PACU Anesthesia Type: MAC Level of consciousness: awake and alert Pain management: pain level controlled Vital Signs Assessment: post-procedure vital signs reviewed and stable Respiratory status: spontaneous breathing, nonlabored ventilation, respiratory function stable and patient connected to nasal cannula oxygen Cardiovascular status: stable and blood pressure returned to baseline Anesthetic complications: no       Last Vitals:  Vitals:   05/14/16 2103 05/15/16 0431  BP: 135/69 119/62  Pulse: 98 97  Resp: 15 15  Temp:  (!) 38.2 C    Last Pain:  Vitals:   05/14/16 2126  TempSrc:   PainSc: Asleep                 Catalina Gravel

## 2016-05-15 NOTE — Progress Notes (Signed)
[facility] Progress Note  Subjective  - POD #1  Reports some soreness in left hand and left upper arm. Denies any numbness left hand.   Objective Vitals:   05/14/16 2103 05/15/16 0431  BP: 135/69 119/62  Pulse: 98 97  Resp: 15 15  Temp:  (!) 100.8 F (38.2 C)    Intake/Output Summary (Last 24 hours) at 05/15/16 0919 Last data filed at 05/15/16 0600  Gross per 24 hour  Intake              710 ml  Output              320 ml  Net              390 ml    Left upper arm graft with audible bruit Incisions clean and intact. Non palpable left radial and ulnar pulses but hand is warm without ischemic changes. Strong left hand grip with sensation intact left hand.   Assessment/Planning: 41 y.o. female is s/p: left upper arm loop graft 1 Day Post-Op   Left upper arm graft patent. Incisions clean. Some soreness left hand. No numbness. Currently tolerable. Ok to cannulate graft in 4 weeks.  Will sign off. Call if needed.   Raymond Gurney 05/15/2016 9:19 AM --  Laboratory CBC    Component Value Date/Time   WBC 15.0 (H) 05/15/2016 0713   HGB 8.5 (L) 05/15/2016 0713   HGB 8.6 (L) 09/16/2013 1301   HCT 26.4 (L) 05/15/2016 0713   HCT 25.4 (L) 05/03/2016 0533   PLT 245 05/15/2016 0713   PLT 433 09/16/2013 1301    BMET    Component Value Date/Time   NA 137 05/14/2016 2113   NA 137 09/16/2013 1301   K 4.7 05/14/2016 2113   K 4.7 09/16/2013 1301   CL 101 05/14/2016 2113   CL 108 (H) 09/16/2013 1301   CO2 25 05/14/2016 2113   CO2 24 09/16/2013 1301   GLUCOSE 171 (H) 05/14/2016 2113   GLUCOSE 125 (H) 09/16/2013 1301   BUN 45 (H) 05/14/2016 2113   BUN 15 09/16/2013 1301   CREATININE 3.41 (H) 05/14/2016 2113   CREATININE 1.79 (H) 09/16/2013 1301   CALCIUM 8.9 05/14/2016 2113   CALCIUM 8.3 (L) 09/16/2013 1301   GFRNONAA 16 (L) 05/14/2016 2113   GFRNONAA 36 (L) 09/16/2013 1301   GFRAA 18 (L) 05/14/2016 2113   GFRAA 42 (L) 09/16/2013 1301     COAG Lab Results  Component Value Date   INR 1.90 05/08/2016   INR 1.70 05/07/2016   No results found for: PTT  Antibiotics Anti-infectives    Start     Dose/Rate Route Frequency Ordered Stop   05/14/16 1430  cefTAZidime (FORTAZ) 1 g in dextrose 5 % 50 mL IVPB     1 g 100 mL/hr over 30 Minutes Intravenous Every 24 hours 05/14/16 1406     05/14/16 1430  doxycycline (VIBRA-TABS) tablet 100 mg     100 mg Oral Every 12 hours 05/14/16 1406     05/14/16 0000  ceFAZolin (ANCEF) IVPB 1 g/50 mL premix    Comments:  Send with pt to OR   1 g 100 mL/hr over 30 Minutes Intravenous On call 05/13/16 1630 05/15/16 0000   05/12/16 1330  cefTAZidime (FORTAZ) 1 g in dextrose 5 % 50 mL IVPB  Status:  Discontinued     1 g 100 mL/hr over 30 Minutes Intravenous Every 24 hours 05/12/16 1319 05/14/16  1406   05/08/16 1000  vancomycin (VANCOCIN) IVPB 1000 mg/200 mL premix     1,000 mg 200 mL/hr over 60 Minutes Intravenous To Surgery 05/07/16 1904 05/08/16 1004   05/08/16 0902  vancomycin (VANCOCIN) 1-5 GM/200ML-% IVPB    Comments:  Matilde Haymaker, [name]   : cabinet override      05/08/16 0902 05/08/16 0904   05/07/16 1300  ceFAZolin (ANCEF) 3 g in dextrose 5 % 50 mL IVPB     3 g 130 mL/hr over 30 Minutes Intravenous To Radiology 05/07/16 1232 05/07/16 1330   05/02/16 2200  doxycycline (VIBRA-TABS) tablet 100 mg  Status:  Discontinued     100 mg Oral Every 12 hours 05/02/16 1732 05/14/16 1406   05/01/16 2200  piperacillin-tazobactam (ZOSYN) IVPB 2.25 g  Status:  Discontinued     2.25 g 100 mL/hr over 30 Minutes Intravenous Every 8 hours 05/01/16 1324 05/02/16 1729   05/01/16 1330  vancomycin (VANCOCIN) 2,500 mg in sodium chloride 0.9 % 500 mL IVPB     2,500 mg 250 mL/hr over 120 Minutes Intravenous  Once 05/01/16 1309 05/01/16 1551   05/01/16 1300  piperacillin-tazobactam (ZOSYN) IVPB 3.375 g     3.375 g 100 mL/hr over 30 Minutes Intravenous  Once 05/01/16 1258 05/01/16 1340   05/01/16 1300   vancomycin (VANCOCIN) IVPB 1000 mg/200 mL premix  Status:  Discontinued     1,000 mg 200 mL/hr over 60 Minutes Intravenous  Once 05/01/16 1258 05/01/16 1308       Maris Berger, PA-C [facility] Office: [phone] Pager: [phone] 05/15/2016 9:19 AM

## 2016-05-15 NOTE — Progress Notes (Signed)
Subjective: Interval History: has complaints starting to help with transfers.  Objective: Vital signs in last 24 hours: Temp:  [98.1 F (36.7 C)-100.8 F (38.2 C)] 98.1 F (36.7 C) (04/04 0910) Pulse Rate:  [94-98] 97 (04/04 0910) Resp:  [15-21] 16 (04/04 0910) BP: (119-172)/(62-89) 120/75 (04/04 0910) SpO2:  [97 %-100 %] 100 % (04/04 0910) Weight change:   Intake/Output from previous day: 04/03 0701 - 04/04 0700 In: 710 [P.O.:360; I.V.:300; IV Piggyback:50] Out: 395 [Urine:375; Blood:20] Intake/Output this shift: No intake/output data recorded.  General appearance: cooperative, no distress and morbidly obese Resp: diminished breath sounds bilaterally Chest wall: IJ PC Cardio: S1, S2 normal and systolic murmur: holosystolic 2/6, blowing at apex GI: massive, pos bs Extremities: edema 2-3+  Lab Results:  Recent Labs  05/14/16 2113 05/15/16 0713  WBC 14.7* 15.0*  HGB 8.3* 8.5*  HCT 27.6* 26.4*  PLT 225 245   BMET:  Recent Labs  05/14/16 0712 05/14/16 2113  NA 137 137  K 4.3 4.7  CL 98* 101  CO2 27 25  GLUCOSE 179* 171*  BUN 36* 45*  CREATININE 3.17* 3.41*  CALCIUM 9.0 8.9   No results for input(s): PTH in the last 72 hours. Iron Studies: No results for input(s): IRON, TIBC, TRANSFERRIN, FERRITIN in the last 72 hours.  Studies/Results: No results found.  I have reviewed the patient's current medications.  Assessment/Plan: 1 ESRD HD today. Lower vol and solute 2 Anemia esa 3 HPTH vit D  4 DM controlled 5 Massive obesity 6 Debillity needs ongoing PT 7 HTN lower meds P HD, lower vol, meds, mobilize  LOS: 14 days   Mansoor Hillyard L 05/15/2016,12:09 PM

## 2016-05-15 NOTE — Progress Notes (Signed)
PROGRESS NOTE  [name]  AVW:[id number] DOB: September 02, 1975 DOA: 05/01/2016 PCP: Marletta Lor, NP  Brief Narrative:  41 y.o.femalewith medical history significant formorbid obesity, chronic kidney disease stage IV-V, diabetes mellitus, hypertension, prior CVA, diastolic CHF who presented to ED with progressive weakness, fever for past 1 week prior to this admission. She is actually bed bound for past year or so per her husband report ever since stroke. She also had dry cough and shortness of breath for past 1 week.  Her creatinine was 7.9 (much worse since her recent Cr values of 4-5).  She was started on hemodialysis and underwent left upper arm AV graft placement on 4/3.  She is anticipating going home with her family but will likely need a hoyer lift to get her in and out of wheelchair for transport to dialysis.    Assessment & Plan:   Principal Problem:   Acute metabolic encephalopathy Active Problems:   Morbid obesity (HCC)   Essential hypertension   History of CVA with residual deficit   Anemia of chronic disease   Lymphedema   Debility   Acute pulmonary edema (HCC)   Acute renal failure superimposed on stage 5 chronic kidney disease, not on chronic dialysis (HCC)   Acute respiratory failure with hypoxia (HCC)   Right middle lobe pneumonia (HCC)   Leukocytosis   Uncontrolled diabetes mellitus with diabetic nephropathy, with long-term current use of insulin (HCC)   Dyslipidemia associated with type 2 diabetes mellitus (HCC)   ESRD (end stage renal disease) (HCC)   Goals of care, counseling/discussion   Palliative care by specialist  Acute metabolic encephalopathy - Improved, at baseline mental status - Likely multifactorial from history of CVA, end stage renal disease, possible uremia, acute infectious process - possible pneumonia  Chest pain episode  -  Troponin 1 was 0.04 however d-dimer was 2.01 - VQ scan Negative for any PE   ESRD with acute pulmonary  edema  - Due to progressive disease from uncontrolled diabetes and FSG. Patient was placed on aggressive IV Lasix and metolazone per renal recommendations. - Tunneled dialysis cath Placed on 3/27. Hemodialysis initiated 3/28, then 3/29, HD 4/1, HD 4/2 - Accepted at [facility] [address]  - Vascular surgery also following, AV graft placement 4/3 - Patient to sit in chair for HD today  - will arrange for hoyer lift at home to assist with transfers to wheelchair for dialysis  Acute respiratory failure with hypoxia with possible right middle lobe pneumonia vs. Atelectasis on CXR.  Persistent fevers, leukocytosis despite more than a week of doxycycline and the addition of ceftaz   - Blood cultures 1/2 showed coagnegative staph likely contaminant  -  Repeat blood cultures -  D/c doxycycline and ceftaz  Anemia of chronic disease / IDA - Due to CKD - Status post 2 units PRBC transfusion on 05/02/2016  - Aranesp 3/22, continue iron supplementation, B12  Uncontrolled diabetes mellitus with diabetic nephropathy with long term insulin use - Continue current insulin regimen: SSI - CBGs elevated, added 5 units Lantus at bedtime  Dyslipidemia associated with type 2 DM - Continue statin therapy   Essential hypertension - Continue Norvasc, coreg, hydralazine, Imdur   Bilateral inner thigh wounds - Related to moisture and friction from thighs rubbing together - Left inner thigh has large area with many open areas scattered throughout, 17cm x 12cm x 0.1cm  - Right inner thigh wound is 8cm x 5cm x 0.3cm  - Dressing procedure/placement/frequency: Cleanse  with NS, gently pat dry, apply Xeroform gauze, top with dry gauze,change every shift. Pt is already on a low air loss mattress - Fevers improving, leukocytosis trending down, continue Fortaz IV, doxycycline   Morbid obesity / Debility  - Nutrition consulted - Body mass index is 54.58 kg/m.   DVT prophylaxis:  heparin Code  Status:  full Family Communication:  Patient alone Disposition Plan:  Possibly to home tomorrow   Consultants:   Nephrology  WOC  Palliative care   Nutrition  Vascular surgery Procedures:  Hemodialysis is initiated 3/28  Antimicrobials:  Anti-infectives    Start     Dose/Rate Route Frequency Ordered Stop   05/14/16 1430  cefTAZidime (FORTAZ) 1 g in dextrose 5 % 50 mL IVPB  Status:  Discontinued     1 g 100 mL/hr over 30 Minutes Intravenous Every 24 hours 05/14/16 1406 05/15/16 1325   05/14/16 1430  doxycycline (VIBRA-TABS) tablet 100 mg  Status:  Discontinued     100 mg Oral Every 12 hours 05/14/16 1406 05/15/16 1325   05/14/16 0000  ceFAZolin (ANCEF) IVPB 1 g/50 mL premix  Status:  Discontinued    Comments:  Send with pt to OR   1 g 100 mL/hr over 30 Minutes Intravenous On call 05/13/16 1630 05/15/16 1325   05/12/16 1330  cefTAZidime (FORTAZ) 1 g in dextrose 5 % 50 mL IVPB  Status:  Discontinued     1 g 100 mL/hr over 30 Minutes Intravenous Every 24 hours 05/12/16 1319 05/14/16 1406   05/08/16 1000  vancomycin (VANCOCIN) IVPB 1000 mg/200 mL premix     1,000 mg 200 mL/hr over 60 Minutes Intravenous To Surgery 05/07/16 1904 05/08/16 1004   05/08/16 0902  vancomycin (VANCOCIN) 1-5 GM/200ML-% IVPB    Comments:  Matilde Haymaker, [name]   : cabinet override      05/08/16 0902 05/08/16 0904   05/07/16 1300  ceFAZolin (ANCEF) 3 g in dextrose 5 % 50 mL IVPB     3 g 130 mL/hr over 30 Minutes Intravenous To Radiology 05/07/16 1232 05/07/16 1330   05/02/16 2200  doxycycline (VIBRA-TABS) tablet 100 mg  Status:  Discontinued     100 mg Oral Every 12 hours 05/02/16 1732 05/14/16 1406   05/01/16 2200  piperacillin-tazobactam (ZOSYN) IVPB 2.25 g  Status:  Discontinued     2.25 g 100 mL/hr over 30 Minutes Intravenous Every 8 hours 05/01/16 1324 05/02/16 1729   05/01/16 1330  vancomycin (VANCOCIN) 2,500 mg in sodium chloride 0.9 % 500 mL IVPB     2,500 mg 250 mL/hr over 120 Minutes  Intravenous  Once 05/01/16 1309 05/01/16 1551   05/01/16 1300  piperacillin-tazobactam (ZOSYN) IVPB 3.375 g     3.375 g 100 mL/hr over 30 Minutes Intravenous  Once 05/01/16 1258 05/01/16 1340   05/01/16 1300  vancomycin (VANCOCIN) IVPB 1000 mg/200 mL premix  Status:  Discontinued     1,000 mg 200 mL/hr over 60 Minutes Intravenous  Once 05/01/16 1258 05/01/16 1308       Subjective:  Feeling stronger but unable to sit up in bed without assistance.  Has been unable to assist with transfers.    Objective: Vitals:   05/15/16 1330 05/15/16 1400 05/15/16 1412 05/15/16 1430  BP: (!) 154/67 (!) 142/73  122/65  Pulse: 98 92  92  Resp: (!) 22 (!) 26  (!) 22  Temp:      TempSrc:      SpO2: 91% 98% 93% 95%  Weight:  Height:        Intake/Output Summary (Last 24 hours) at 05/15/16 1553 Last data filed at 05/15/16 1033  Gross per 24 hour  Intake              360 ml  Output              300 ml  Net               60 ml   Filed Weights   05/13/16 2129 05/15/16 1200  Weight: 122 kg (268 lb 15.4 oz) 122 kg (268 lb 15.4 oz)    Examination:  General exam:  Adult female, obese.  No acute distress.  HEENT:  NCAT, MMM Respiratory system: Clear to auscultation bilaterally Cardiovascular system: Regular rate and rhythm, normal S1/S2. No murmurs, rubs, gallops or clicks.  Warm extremities Gastrointestinal system: Normal active bowel sounds, soft, nondistended, nontender. MSK:  Normal tone and bulk, legs appear wrinkled as if previous swelling has improved Neuro:  Diffusely weak, decreased muscle tone and bulk    Data Reviewed: I have personally reviewed following labs and imaging studies  CBC:  Recent Labs Lab 05/11/16 0746 05/13/16 0735 05/14/16 0712 05/14/16 2113 05/15/16 0713  WBC 17.2* 15.0* 15.7* 14.7* 15.0*  HGB 8.4* 8.5* 8.7* 8.3* 8.5*  HCT 27.8* 28.7* 28.9* 27.6* 26.4*  MCV 90.0 90.8 90.9 89.6 90.7  PLT 299 251 254 225 245   Basic Metabolic Panel:  Recent  Labs Lab 05/09/16 1030 05/11/16 0747 05/12/16 1611 05/14/16 0712 05/14/16 2113  NA 142 146* 141 137 137  K 3.9 4.3 5.5* 4.3 4.7  CL 100* 104 102 98* 101  CO2 GLUCOSE 155* 182* 224* 179* 171*  BUN 94* 74* 54* 36* 45*  CREATININE 5.64* 5.07* 4.11* 3.17* 3.41*  CALCIUM 8.8* 9.5 9.4 9.0 8.9  PHOS 5.1* 4.3 3.5  --  3.5   GFR: Estimated Creatinine Clearance: 29.5 mL/min (A) (by C-G formula based on SCr of 3.41 mg/dL (H)). Liver Function Tests:  Recent Labs Lab 05/09/16 1030 05/11/16 0747 05/12/16 1611 05/14/16 2113  ALBUMIN 2.0* 2.1* 2.1* 1.8*   No results for input(s): LIPASE, AMYLASE in the last 168 hours. No results for input(s): AMMONIA in the last 168 hours. Coagulation Profile: No results for input(s): INR, PROTIME in the last 168 hours. Cardiac Enzymes: No results for input(s): CKTOTAL, CKMB, CKMBINDEX, TROPONINI in the last 168 hours. BNP (last 3 results) No results for input(s): PROBNP in the last 8760 hours. HbA1C: No results for input(s): HGBA1C in the last 72 hours. CBG:  Recent Labs Lab 05/14/16 1234 05/14/16 1341 05/14/16 1732 05/14/16 2110 05/15/16 0903  GLUCAP 158* 164* 234* 167* 171*   Lipid Profile: No results for input(s): CHOL, HDL, LDLCALC, TRIG, CHOLHDL, LDLDIRECT in the last 72 hours. Thyroid Function Tests: No results for input(s): TSH, T4TOTAL, FREET4, T3FREE, THYROIDAB in the last 72 hours. Anemia Panel: No results for input(s): VITAMINB12, FOLATE, FERRITIN, TIBC, IRON, RETICCTPCT in the last 72 hours. Urine analysis:    Component Value Date/Time   COLORURINE STRAW (A) 05/01/2016 1144   APPEARANCEUR CLEAR 05/01/2016 1144   LABSPEC 1.009 05/01/2016 1144   PHURINE 5.0 05/01/2016 1144   GLUCOSEU NEGATIVE 05/01/2016 1144   HGBUR SMALL (A) 05/01/2016 1144   BILIRUBINUR NEGATIVE 05/01/2016 1144   KETONESUR NEGATIVE 05/01/2016 1144   PROTEINUR 30 (A) 05/01/2016 1144   UROBILINOGEN 0.2 10/26/2013 1721   NITRITE NEGATIVE  05/01/2016 1144  LEUKOCYTESUR NEGATIVE 05/01/2016 1144   Sepsis Labs: (procalcitonin:4,lacticidven:4)  ) Recent Results (from the past 240 hour(s))  MRSA PCR Screening     Status: None   Collection Time: 05/13/16 11:26 AM  Result Value Ref Range Status   MRSA by PCR NEGATIVE NEGATIVE Final    Comment:        The GeneXpert MRSA Assay (FDA approved for NASAL specimens only), is one component of a comprehensive MRSA colonization surveillance program. It is not intended to diagnose MRSA infection nor to guide or monitor treatment for MRSA infections.       Radiology Studies: No results found.   Scheduled Meds: . albuterol  3 mL Inhalation Q M,W,F-HD  . amLODipine  10 mg Oral QHS  . atorvastatin  20 mg Oral Daily  . calcium acetate  667 mg Oral TID WC  . carvedilol  6.25 mg Oral BID WC  . [START ON 05/17/2016] darbepoetin (ARANESP) injection - DIALYSIS  150 mcg Intravenous Q Fri-HD  . feeding supplement (NEPRO CARB STEADY)  237 mL Oral TID BM  . ferric gluconate (FERRLECIT/NULECIT) IV  125 mg Intravenous Q M,W,F-HD  . gi cocktail  30 mL Oral Once  . heparin  40 Units/kg Dialysis Once in dialysis  . heparin  5,000 Units Subcutaneous Q8H  . hydrALAZINE  50 mg Oral BID  . insulin aspart  0-5 Units Subcutaneous QHS  . insulin aspart  0-9 Units Subcutaneous TID WC  . insulin glargine  5 Units Subcutaneous QHS  . isosorbide mononitrate  30 mg Oral Daily  . multivitamin  1 tablet Oral QHS  . polyethylene glycol  17 g Oral Daily  . senna-docusate  1 tablet Oral BID   Continuous Infusions:   LOS: 14 days    Time spent: 30 min    Renae Fickle, MD [facility] Pager [phone]  If 7PM-7AM, please contact night-coverage [email or web address] Password [facility] 05/15/2016, 3:53 PM

## 2016-05-15 NOTE — Progress Notes (Signed)
qPhysical Therapy Treatment Patient Details Name: [name] MRN: [id number] DOB: 1975-04-18 Today's Date: 05/15/2016    History of Present Illness 41 y.o. female with medical history significant of morbid obesity, chronic kidney disease stage IV-V, diabetes mellitus, hypertension, prior CVA, diastolic CHF, presents to the emergency room with chief complaint of weakness as well as fever and diagnosed with acute hypoxic resp failure with acute encephalopathy. S/p L AV graft on 05/14/16.     PT Comments    Pt continues to be minimally participative in session. Continues to require max A +2 for basic bed mobility and use of maxisky to lift into recliner for HD. Pt reporting she will be going home. If pt is to return home, will need equipment below, plus extensive family training. From PT standpoint, continue to recommend SNF at d/c to increase functional mobility independence and safety with mobility. Will continue to follow.    Follow Up Recommendations  SNF;Supervision/Assistance - 24 hour     Equipment Recommendations  Hospital bed;Wheelchair cushion (measurements PT);Wheelchair (measurements PT);Other (comment) (Will need bari WC, bari lift with bari lift sling)    Recommendations for Other Services       Precautions / Restrictions Precautions Precautions: Fall Precaution Comments: bil inner thigh wounds Restrictions Weight Bearing Restrictions: No    Mobility  Bed Mobility Overal bed mobility: Needs Assistance Bed Mobility: Rolling Rolling: Max assist;+2 for physical assistance         General bed mobility comments: Max A +2 for rolling for placement of lift pad. Verbal cues for participation in bed mobility, however, pt demonstrating little participation.   Transfers Overall transfer level: Needs assistance               General transfer comment: Use of maxisky for transfer to HD recliner.   Ambulation/Gait                 Stairs             Wheelchair Mobility    Modified Rankin (Stroke Patients Only)       Balance                                            Cognition Arousal/Alertness: Awake/alert Behavior During Therapy: Flat affect Overall Cognitive Status: Within Functional Limits for tasks assessed                                 General Comments: Pt not very motivated to participate with therapy this session.       Exercises Other Exercises Other Exercises: RUE resisted elbow extension and flexion X5. Max cues to participate.  Other Exercises: BLE resisted hip/knee flexion X 5. Max cues to participate.     General Comments General comments (skin integrity, edema, etc.): Pt with little participation this session. Max cues to participate in BLE and RUE exercises once in recliner. Pt closing eyes throughout session despite cues for participation. Pt reporting she will be going home, despite recommendations for SNF. Educated about need for lift for mobility and will require EXTENSIVE family education.       Pertinent Vitals/Pain Pain Assessment: Faces Faces Pain Scale: Hurts little more Pain Location: Bilat thighs and L arm Pain Descriptors / Indicators: Sore;Grimacing Pain Intervention(s): Limited activity within patient's tolerance;Monitored during session;Repositioned  Home Living                      Prior Function            PT Goals (current goals can now be found in the care plan section) Acute Rehab PT Goals Patient Stated Goal: Wants to be able to move well enough to qualify for HD PT Goal Formulation: With patient Time For Goal Achievement: 05/22/16 Potential to Achieve Goals: Fair Progress towards PT goals: Progressing toward goals    Frequency    Min 3X/week      PT Plan Current plan remains appropriate    Co-evaluation             End of Session   Activity Tolerance: Other (comment);Patient limited by pain (Pt with decreased  participation ) Patient left: in chair;with call bell/phone within reach Nurse Communication: Mobility status PT Visit Diagnosis: Muscle weakness (generalized) (M62.81);Other abnormalities of gait and mobility (R26.89);Pain Pain - Right/Left: Left Pain - part of body: Arm     Time: 1610-9604 PT Time Calculation (min) (ACUTE ONLY): 22 min  Charges:  $Therapeutic Activity: 8-22 mins                    G Codes:       [name], PT, DPT  Acute Rehabilitation Services  Pager: [phone]  [name] 05/15/2016, 12:02 PM

## 2016-05-16 LAB — GLUCOSE, CAPILLARY
GLUCOSE-CAPILLARY: 148 mg/dL — AB (ref 65–99)
Glucose-Capillary: 156 mg/dL — ABNORMAL HIGH (ref 65–99)
Glucose-Capillary: 171 mg/dL — ABNORMAL HIGH (ref 65–99)
Glucose-Capillary: 189 mg/dL — ABNORMAL HIGH (ref 65–99)

## 2016-05-16 LAB — URINALYSIS, ROUTINE W REFLEX MICROSCOPIC
BILIRUBIN URINE: NEGATIVE
GLUCOSE, UA: NEGATIVE mg/dL
KETONES UR: NEGATIVE mg/dL
Nitrite: NEGATIVE
PH: 5 (ref 5.0–8.0)
Protein, ur: 100 mg/dL — AB
Specific Gravity, Urine: 1.019 (ref 1.005–1.030)

## 2016-05-16 MED ORDER — DEXTROSE 5 % IV SOLN
1.0000 g | INTRAVENOUS | Status: DC
  Administered 2016-05-16 – 2016-05-17 (×2): 1 g via INTRAVENOUS
  Filled 2016-05-16 (×2): qty 10

## 2016-05-16 NOTE — Clinical Social Work Note (Signed)
CSW intern Renard Hamper  provided patient with [facility] application and faxed to [facility] office.  CSW Intern talked with [facility] staff and confirmed receipt of information and they will make contact with patient. [place] with [employer] needs to be informed of patient's dialysis days and can be reached at [phone]. CSW will follow up with [name] on Friday, 4/6 regarding HD information.  Genelle Bal, MSW, LCSW Licensed Clinical Social Worker Clinical Social Work Department [employer] [phone]

## 2016-05-16 NOTE — Progress Notes (Deleted)
  Addendum to 4/5 Physical Therapy Note.  [id number]  Returned later to follow up with pt's sister/family concerning team discussion on home needs with Case Management  Helped pt's sister and Case Management with modification ideas to their home based on equipment needs.  It was decided that in addition to some of the equipment pt would need a hospital bed to expedite the discharge home.  Also for safety, I feel pt/family need 2 hoyer pads to go with the lift and 2 incontinence pads to fit between the pt and the hoyer pad.  Pt will likely not get any HHPT, so spent extra time discussing use of hoyer to get pt through doors and into w/c that will no fit through bed room door.  Also made pt aware that their furniture  (couch) etc would need to be raised on risers if pt was to be hoyered to those surfaces.  05/16/2016  Ferris Bing, PT [phone]  (pager)

## 2016-05-16 NOTE — Progress Notes (Signed)
Nutrition Follow-up  DOCUMENTATION CODES:   Morbid obesity  INTERVENTION:    Continue Nepro Shake po TID, each supplement provides 425 kcal and 19 grams protein  NUTRITION DIAGNOSIS:   Inadequate oral intake related to acute illness, poor appetite as evidenced by meal completion < 25%; improved  GOAL:   Patient will meet greater than or equal to 90% of their needs; progressing  MONITOR:   PO intake, Supplement acceptance, Labs, Skin  ASSESSMENT:   41 y.o. female with medical history significant of morbid obesity, chronic kidney disease stage IV-V, diabetes mellitus, hypertension, prior CVA, diastolic CHF, presents to the emergency room with chief complaint of weakness as well as fever. Patient found to have HCAP   Pt new ESRD on HD.  HD initiated 3/28.  Continues on a Renal/Carbohydrate Modified diet. PO intake variable at 0-100% per flowsheets records. Receiving Nepro Shake TID. CBG's [phone].  Diet Order:  Diet renal/carb modified with fluid restriction Diet-HS Snack? Nothing; Room service appropriate? Yes; Fluid consistency: Thin  Skin:  Wound (see comment) (Stage II to R thigh)  Last BM:  4/5  Height:   Ht Readings from Last 1 Encounters:  05/07/16  (1.676 m)   Weight:   Wt Readings from Last 1 Encounters:  05/16/16 268 lb 15.4 oz (122 kg)   Ideal Body Weight:  59 kg  BMI:  Body mass index is 43.41 kg/m.  Estimated Nutritional Needs:   Kcal:  2200-2400kcal/day   Protein:  116-145g/day   Fluid:  >2.2L/day   EDUCATION NEEDS:   Education needs no appropriate at this time  [name], RD, LDN Pager #: [phone] After-Hours Pager #: [phone]

## 2016-05-16 NOTE — Progress Notes (Addendum)
Pharmacy Antibiotic Note  [name] is a 41 y.o. female admitted on 05/01/2016 with UTI.  Pharmacy has been consulted for ceftriaxone dosing. Previously on doxy/ceftazidime for fevers, PNA - d/c'd 05/15/16. Tmax/24h 100, wbc stable 15.  Plan: Ceftriaxone 1g IV q24h F/u clinical progress, LOT Not renally adjusted - Rx will s/o consult  Height:  (167.6 cm) Weight: 268 lb 15.4 oz (122 kg) IBW/kg (Calculated) : 59.3  Temp (24hrs), Avg:98.8 F (37.1 C), Min:98 F (36.7 C), Max:100 F (37.8 C)   Recent Labs Lab 05/11/16 0746 05/11/16 0747 05/12/16 1611 05/13/16 0735 05/14/16 0712 05/14/16 2113 05/15/16 0713  WBC 17.2*  --   --  15.0* 15.7* 14.7* 15.0*  CREATININE  --  5.07* 4.11*  --  3.17* 3.41*  --     Estimated Creatinine Clearance: 29.5 mL/min (A) (by C-G formula based on SCr of 3.41 mg/dL (H)).    Allergies  Allergen Reactions  . Azithromycin Other (See Comments)    Nose bleeding event    Antimicrobials this admission: 3/21 Vanc >> 3/22 3/21 Zosyn >> 3/22 3/22 Doxy>>4/4 4/1 Ceftaz >>4/4 4/5 ctx>>   Dose adjustments this admission: 3/23 @ 0530:  RVL 25 after  on 3/23.  Drug d/c'd.   Microbiology results: 4/5 UC: pending 4/4 Bcx: ngtd 3/21 BCx: CONS in 1/2 bottles 3/21 UCx: neg 3/21 HIV Ab: neg  Babs Bertin, PharmD, BCPS Clinical Pharmacist 05/16/2016 4:25 PM

## 2016-05-16 NOTE — Progress Notes (Signed)
qPhysical Therapy Treatment Patient Details Name: [name] MRN: [id number] DOB: 02-27-1975 Today's Date: 05/16/2016    History of Present Illness 41 y.o. female with medical history significant of morbid obesity, chronic kidney disease stage IV-V, diabetes mellitus, hypertension, prior CVA, diastolic CHF, presents to the emergency room with chief complaint of weakness as well as fever and diagnosed with acute hypoxic resp failure with acute encephalopathy. S/p L AV graft on 05/14/16.     PT Comments    Pt remains with low participation and education to family provided about pt's decreased participation with therapy. Pt's sister present throughout session and extensive family education provided about home management, use of lift equipment, and proper body mechanics when assisting pt with bed mobility. Pt's sister asked questions throughout and PT answered appropriately. Measurements for WC below. Continue to recommend SNF at d/c, however, plan is to go home and family education has been completed. Will continue to follow to address any pt family education needs and increasing pt mobility.    Follow Up Recommendations  SNF;Supervision/Assistance - 24 hour     Equipment Recommendations  Hospital bed;Wheelchair (measurements PT);Wheelchair cushion (measurements PT);Other (comment) (hip width-26 in; hip to knee-25 in; knee to foot-17 in; lift)    Recommendations for Other Services       Precautions / Restrictions Precautions Precautions: Fall Precaution Comments: bil inner thigh wounds Restrictions Weight Bearing Restrictions: No    Mobility  Bed Mobility Overal bed mobility: Needs Assistance Bed Mobility: Rolling Rolling: Max assist;+2 for physical assistance         General bed mobility comments: Extensive family education about using drawsheet to roll pt and how to place lift pad under pt for home management. Problem solving with pt's sister about how to raise the pt's  bed off the floor in order to fit lift under the bed.   Transfers Overall transfer level: Needs assistance               General transfer comment: Family education about the hoyer equipment they will be receiving at home and similarities/differences to [facility] equipment at hospital. Education about how to properly secure pt to lift using pad. Education about how to manage lift and WC at home and techniques to use if the [facility] would not fit through the door.   Ambulation/Gait                 Stairs            Wheelchair Mobility    Modified Rankin (Stroke Patients Only)       Balance   Sitting-balance support: No upper extremity supported;Feet unsupported Sitting balance-Leahy Scale: Poor Sitting balance - Comments: Pt with little tone and R lateral lean; able to self correct in recliner when she becomes uncomfortable.                                     Cognition Arousal/Alertness:  (Pt awake but required cues for eye opening throughout) Behavior During Therapy: Flat affect Overall Cognitive Status: Within Functional Limits for tasks assessed                                 General Comments: Pt continues to exhibit low motivation and participation to participate with PT.       Exercises      General  Comments General comments (skin integrity, edema, etc.): Pt's sister present throughout session. Extensive education with pt's sister about recommended home equipment and how to manage pt at home. Discussed possible resources for pt's family, although currently very little. Measured for new WC: measurements are 26 inches for hip width, 25 inches from hip to knee, and 17 inches from knee to foot.       Pertinent Vitals/Pain Pain Assessment: Faces Faces Pain Scale: Hurts little more Pain Location: Bilat thighs and L arm Pain Descriptors / Indicators: Sore;Grimacing Pain Intervention(s): Limited activity within patient's  tolerance;Monitored during session;Repositioned    Home Living                      Prior Function            PT Goals (current goals can now be found in the care plan section) Acute Rehab PT Goals Patient Stated Goal: Wants to be able to move well enough to qualify for HD PT Goal Formulation: With patient Time For Goal Achievement: 05/22/16 Potential to Achieve Goals: Fair Progress towards PT goals: Not progressing toward goals - comment (low motivation )    Frequency    Min 3X/week      PT Plan Current plan remains appropriate    Co-evaluation             End of Session   Activity Tolerance: Other (comment) (pt with little motivation to participate) Patient left: in chair;with call bell/phone within reach;with family/visitor present Nurse Communication: Mobility status PT Visit Diagnosis: Muscle weakness (generalized) (M62.81);Other abnormalities of gait and mobility (R26.89);Pain Pain - Right/Left: Left Pain - part of body: Arm     Time: 8295-6213 PT Time Calculation (min) (ACUTE ONLY): 60 min  Charges:  $Therapeutic Activity: 53-67 mins                    G Codes:       [name], PT, DPT  Acute Rehabilitation Services  Pager: [phone]  [name] 05/16/2016, 2:40 PM

## 2016-05-16 NOTE — Care Management Note (Addendum)
Case Management Note  Patient Details  Name: [name] MRN: [id number] Date of Birth: [birth year]  Subjective/Objective:    CM following for progression and d/c planning.                 Action/Plan: 05/16/2016 Met with pt re d/c needs, plan to order hoyer lift, this pt has a wheelchair but now reports that her wheelchair is broken. She states that she purchased that wheelchair, so we will attempt to get another wheelchair thru the pt insurance provider.  Pt reports that she has transport arranged with [facility], however per call to [facility] they have no application.  CSW intern Natalia Leatherwood working on this with the pt and pt sister to arrange transportation.  Pt was active with [facility] prior to admission. Additional orders entered and [facility] notified. Per [facility] they will be unable to provide HHPT or HHOT.   Expected Discharge Date:   (unknown)               Expected Discharge Plan:  [place]  In-House Referral:  NA  Discharge planning Services  CM Consult  Post Acute Care Choice:  Durable Medical Equipment, Home Health Choice offered to:  Patient  DME Arranged:  Wheelchair manual, [employer] fast streamer. DME Agency:  [place] Arranged:  RN, PT, OT, Nurse's Aide, Social Work [employer] Agency:  [place]  Status of Service:  In process, will continue to follow  If discussed at Long Length of Stay Meetings, dates discussed:    Additional Comments:  Linda Bene, RN 05/16/2016, 11:54 AM

## 2016-05-16 NOTE — Progress Notes (Signed)
PROGRESS NOTE  [name]  WUJ:[id number] DOB: [birth year] DOA: 05/01/2016 PCP: Marletta Lor, NP  Brief Narrative:  41 y.o.femalewith medical history significant formorbid obesity, chronic kidney disease stage IV-V, diabetes mellitus, hypertension, prior CVA, diastolic CHF who presented to ED with progressive weakness, fever for past 1 week prior to this admission. She is actually bed bound for past year or so per her husband report ever since stroke. She also had dry cough and shortness of breath for past 1 week.  Her creatinine was 7.9 (much worse since her recent Cr values of 4-5).  She was started on hemodialysis and underwent left upper arm AV graft placement on 4/3.  She is anticipating going home with her family but will likely need a hoyer lift, hospital bed, and wheelchair for transport to dialysis.  Her sister who is a her primary caretaker is reluctant to take her home and does not feel she will be able to provide adequate care.  Her husband states he will come home to help get her to and from dialysis and provide care, but he is not available all the time.  The patient refuses SNF.  We will plan for HD tomorrow in hospital and plan to discharge once equipment is set up at home.  Apparently, she sleeps on mattress on the floor and the hoyer lift will not fit underneath.  She needs a hospital bed so the hoyer lift can be put in place so that her family can get her in and out of her wheelchair for dialysis.    Assessment & Plan:   Principal Problem:   Acute metabolic encephalopathy Active Problems:   Morbid obesity (HCC)   Essential hypertension   History of CVA with residual deficit   Anemia of chronic disease   Lymphedema   Debility   Acute pulmonary edema (HCC)   Acute renal failure superimposed on stage 5 chronic kidney disease, not on chronic dialysis (HCC)   Acute respiratory failure with hypoxia (HCC)   Right middle lobe pneumonia (HCC)   Leukocytosis  Uncontrolled diabetes mellitus with diabetic nephropathy, with long-term current use of insulin (HCC)   Dyslipidemia associated with type 2 diabetes mellitus (HCC)   ESRD (end stage renal disease) (HCC)   Goals of care, counseling/discussion   Palliative care by specialist  Acute metabolic encephalopathy - Improved, at baseline mental status - Likely multifactorial from history of CVA, end stage renal disease, possible uremia, acute infectious process - possible pneumonia  Chest pain episode  -  Troponin 1 was 0.04 however d-dimer was 2.01 - VQ scan Negative for any PE   ESRD with acute pulmonary edema  - Due to progressive disease from uncontrolled diabetes and FSG. Patient was placed on aggressive IV Lasix and metolazone per renal recommendations. - Tunneled dialysis cath Placed on 3/27. Hemodialysis initiated 3/28, then 3/29, HD 4/1, HD 4/2 - Accepted at [facility] [address]  - Vascular surgery also following, AV graft placement 4/3 - Patient to sit in chair for HD today  - will arrange for hoyer lift at home to assist with transfers to wheelchair for dialysis  Acute respiratory failure with hypoxia with possible right middle lobe pneumonia vs. Atelectasis on CXR.  Persistent fevers, leukocytosis despite more than a week of doxycycline and the addition of ceftaz   -  Blood cultures 1/2 showed coagnegative staph likely contaminant  -  Repeat blood cultures NGTD  Possible CAUTI -  UA positive -  D/c foley -  f/u urine culture -  Ceftriaxone pending culture  Anemia of chronic disease / IDA - Due to CKD - Status post 2 units PRBC transfusion on 05/02/2016  - Aranesp 3/22, continue iron supplementation, B12  Uncontrolled diabetes mellitus with diabetic nephropathy with long term insulin use - Continue current insulin regimen: SSI - CBGs elevated, added 5 units Lantus at bedtime  Dyslipidemia associated with type 2 DM - Continue statin therapy   Essential  hypertension - Continue Norvasc, coreg, hydralazine, Imdur   Bilateral inner thigh wounds - Related to moisture and friction from thighs rubbing together - Dressing procedure/placement/frequency: Cleanse with NS, gently pat dry, apply Xeroform gauze, top with dry gauze,change every shift. Pt is already on a low air loss mattress -  D/c foley due to possible UTI today  Morbid obesity / Debility  - Nutrition consulted - Body mass index is 54.58 kg/m.   DVT prophylaxis:  heparin Code Status:  full Family Communication:  Patient alone Disposition Plan:  Possibly to home tomorrow   Consultants:   Nephrology  WOC  Palliative care   Nutrition  Vascular surgery Procedures:  Hemodialysis is initiated 3/28  Antimicrobials:  Anti-infectives    Start     Dose/Rate Route Frequency Ordered Stop   05/14/16 1430  cefTAZidime (FORTAZ) 1 g in dextrose 5 % 50 mL IVPB  Status:  Discontinued     1 g 100 mL/hr over 30 Minutes Intravenous Every 24 hours 05/14/16 1406 05/15/16 1325   05/14/16 1430  doxycycline (VIBRA-TABS) tablet 100 mg  Status:  Discontinued     100 mg Oral Every 12 hours 05/14/16 1406 05/15/16 1325   05/14/16 0000  ceFAZolin (ANCEF) IVPB 1 g/50 mL premix  Status:  Discontinued    Comments:  Send with pt to OR   1 g 100 mL/hr over 30 Minutes Intravenous On call 05/13/16 1630 05/15/16 1325   05/12/16 1330  cefTAZidime (FORTAZ) 1 g in dextrose 5 % 50 mL IVPB  Status:  Discontinued     1 g 100 mL/hr over 30 Minutes Intravenous Every 24 hours 05/12/16 1319 05/14/16 1406   05/08/16 1000  vancomycin (VANCOCIN) IVPB 1000 mg/200 mL premix     1,000 mg 200 mL/hr over 60 Minutes Intravenous To Surgery 05/07/16 1904 05/08/16 1004   05/08/16 0902  vancomycin (VANCOCIN) 1-5 GM/200ML-% IVPB    Comments:  Matilde Haymaker, [name]   : cabinet override      05/08/16 0902 05/08/16 0904   05/07/16 1300  ceFAZolin (ANCEF) 3 g in dextrose 5 % 50 mL IVPB     3 g 130 mL/hr over 30 Minutes  Intravenous To Radiology 05/07/16 1232 05/07/16 1330   05/02/16 2200  doxycycline (VIBRA-TABS) tablet 100 mg  Status:  Discontinued     100 mg Oral Every 12 hours 05/02/16 1732 05/14/16 1406   05/01/16 2200  piperacillin-tazobactam (ZOSYN) IVPB 2.25 g  Status:  Discontinued     2.25 g 100 mL/hr over 30 Minutes Intravenous Every 8 hours 05/01/16 1324 05/02/16 1729   05/01/16 1330  vancomycin (VANCOCIN) 2,500 mg in sodium chloride 0.9 % 500 mL IVPB     2,500 mg 250 mL/hr over 120 Minutes Intravenous  Once 05/01/16 1309 05/01/16 1551   05/01/16 1300  piperacillin-tazobactam (ZOSYN) IVPB 3.375 g     3.375 g 100 mL/hr over 30 Minutes Intravenous  Once 05/01/16 1258 05/01/16 1340   05/01/16 1300  vancomycin (VANCOCIN) IVPB 1000 mg/200 mL premix  Status:  Discontinued  1,000 mg 200 mL/hr over 60 Minutes Intravenous  Once 05/01/16 1258 05/01/16 1308       Subjective:  Feeling stronger but unable to sit up in bed without assistance.  Able to sit in chair for HD yesterday and participated with PT yesterday but was unable to assist her family member to get her transferred from bed to chair today.  Family concerned about caring for her at home.    Objective: Vitals:   05/15/16 2201 05/16/16 0459 05/16/16 0634 05/16/16 1041  BP: 126/71  130/65 138/65  Pulse: 91  97 99  Resp: Temp: 98.4 F (36.9 C)  98.9 F (37.2 C) 100 F (37.8 C)  TempSrc: Oral  Oral Oral  SpO2: 100%  100% 95%  Weight: 122 kg (268 lb 15.4 oz) 122 kg (268 lb 15.4 oz)    Height:        Intake/Output Summary (Last 24 hours) at 05/16/16 1606 Last data filed at 05/16/16 1400  Gross per 24 hour  Intake              350 ml  Output              420 ml  Net              -70 ml   Filed Weights   05/15/16 1200 05/15/16 2201 05/16/16 0459  Weight: 122 kg (268 lb 15.4 oz) 122 kg (268 lb 15.4 oz) 122 kg (268 lb 15.4 oz)    Examination:  General exam:  Adult female, obese.  No acute distress.  Unable to sit up  in bed without assistance  HEENT:  NCAT, MMM Respiratory system: Clear to auscultation bilaterally Cardiovascular system: Regular rate and rhythm, normal S1/S2. No murmurs, rubs, gallops or clicks.  Warm extremities Gastrointestinal system: Normal active bowel sounds, soft, nondistended, nontender. MSK:  Normal tone and bulk, legs appear wrinkled as if previous swelling has improved Neuro:  Diffusely weak, decreased muscle tone and bulk    Data Reviewed: I have personally reviewed following labs and imaging studies  CBC:  Recent Labs Lab 05/11/16 0746 05/13/16 0735 05/14/16 0712 05/14/16 2113 05/15/16 0713  WBC 17.2* 15.0* 15.7* 14.7* 15.0*  HGB 8.4* 8.5* 8.7* 8.3* 8.5*  HCT 27.8* 28.7* 28.9* 27.6* 26.4*  MCV 90.0 90.8 90.9 89.6 90.7  PLT 299 251 254 225 245   Basic Metabolic Panel:  Recent Labs Lab 05/11/16 0747 05/12/16 1611 05/14/16 0712 05/14/16 2113  NA 146* 141 137 137  K 4.3 5.5* 4.3 4.7  CL 104 102 98* 101  CO2 GLUCOSE 182* 224* 179* 171*  BUN 74* 54* 36* 45*  CREATININE 5.07* 4.11* 3.17* 3.41*  CALCIUM 9.5 9.4 9.0 8.9  PHOS 4.3 3.5  --  3.5   GFR: Estimated Creatinine Clearance: 29.5 mL/min (A) (by C-G formula based on SCr of 3.41 mg/dL (H)). Liver Function Tests:  Recent Labs Lab 05/11/16 0747 05/12/16 1611 05/14/16 2113  ALBUMIN 2.1* 2.1* 1.8*   No results for input(s): LIPASE, AMYLASE in the last 168 hours. No results for input(s): AMMONIA in the last 168 hours. Coagulation Profile: No results for input(s): INR, PROTIME in the last 168 hours. Cardiac Enzymes: No results for input(s): CKTOTAL, CKMB, CKMBINDEX, TROPONINI in the last 168 hours. BNP (last 3 results) No results for input(s): PROBNP in the last 8760 hours. HbA1C: No results for input(s): HGBA1C in the last 72 hours. CBG:  Recent  Labs Lab 05/15/16 0903 05/15/16 1732 05/15/16 2200 05/16/16 0736 05/16/16 1134  GLUCAP 171* 171* 193* 156* 148*   Lipid  Profile: No results for input(s): CHOL, HDL, LDLCALC, TRIG, CHOLHDL, LDLDIRECT in the last 72 hours. Thyroid Function Tests: No results for input(s): TSH, T4TOTAL, FREET4, T3FREE, THYROIDAB in the last 72 hours. Anemia Panel: No results for input(s): VITAMINB12, FOLATE, FERRITIN, TIBC, IRON, RETICCTPCT in the last 72 hours. Urine analysis:    Component Value Date/Time   COLORURINE AMBER (A) 05/16/2016 1142   APPEARANCEUR TURBID (A) 05/16/2016 1142   LABSPEC 1.019 05/16/2016 1142   PHURINE 5.0 05/16/2016 1142   GLUCOSEU NEGATIVE 05/16/2016 1142   HGBUR MODERATE (A) 05/16/2016 1142   BILIRUBINUR NEGATIVE 05/16/2016 1142   KETONESUR NEGATIVE 05/16/2016 1142   PROTEINUR 100 (A) 05/16/2016 1142   UROBILINOGEN 0.2 10/26/2013 1721   NITRITE NEGATIVE 05/16/2016 1142   LEUKOCYTESUR MODERATE (A) 05/16/2016 1142   Sepsis Labs: (procalcitonin:4,lacticidven:4)  ) Recent Results (from the past 240 hour(s))  MRSA PCR Screening     Status: None   Collection Time: 05/13/16 11:26 AM  Result Value Ref Range Status   MRSA by PCR NEGATIVE NEGATIVE Final    Comment:        The GeneXpert MRSA Assay (FDA approved for NASAL specimens only), is one component of a comprehensive MRSA colonization surveillance program. It is not intended to diagnose MRSA infection nor to guide or monitor treatment for MRSA infections.   Culture, blood (Routine X 2) w Reflex to ID Panel     Status: None (Preliminary result)   Collection Time: 05/15/16  9:03 PM  Result Value Ref Range Status   Specimen Description BLOOD RIGHT HAND  Final   Special Requests   Final    BOTTLES DRAWN AEROBIC ONLY Blood Culture adequate volume   Culture NO GROWTH < 24 HOURS  Final   Report Status PENDING  Incomplete  Culture, blood (Routine X 2) w Reflex to ID Panel     Status: None (Preliminary result)   Collection Time: 05/15/16  9:10 PM  Result Value Ref Range Status   Specimen Description BLOOD RIGHT HAND  Final    Special Requests IN PEDIATRIC BOTTLE Blood Culture adequate volume  Final   Culture NO GROWTH < 24 HOURS  Final   Report Status PENDING  Incomplete      Radiology Studies: No results found.   Scheduled Meds: . albuterol  3 mL Inhalation Q M,W,F-HD  . amLODipine  10 mg Oral QHS  . atorvastatin  20 mg Oral Daily  . calcium acetate  667 mg Oral TID WC  . carvedilol  6.25 mg Oral BID WC  . [START ON 05/17/2016] darbepoetin (ARANESP) injection - DIALYSIS  150 mcg Intravenous Q Fri-HD  . feeding supplement (NEPRO CARB STEADY)  237 mL Oral TID BM  . ferric gluconate (FERRLECIT/NULECIT) IV  125 mg Intravenous Q M,W,F-HD  . gi cocktail  30 mL Oral Once  . heparin  5,000 Units Subcutaneous Q8H  . hydrALAZINE  50 mg Oral BID  . insulin aspart  0-5 Units Subcutaneous QHS  . insulin aspart  0-9 Units Subcutaneous TID WC  . insulin glargine  5 Units Subcutaneous QHS  . isosorbide mononitrate  30 mg Oral Daily  . multivitamin  1 tablet Oral QHS  . polyethylene glycol  17 g Oral Daily  . senna-docusate  1 tablet Oral BID   Continuous Infusions:   LOS: 15 days    Time  spent: 30 min    Renae Fickle, MD [facility] Pager [phone]  If 7PM-7AM, please contact night-coverage [email or web address] Password [email or web address] 05/16/2016, 4:06 PM

## 2016-05-16 NOTE — Progress Notes (Signed)
Addendum to 4/5 Physical Therapy Progress Note  Returned later to follow up with pt's sister/family concerning team discussion on home needs with Case Management  Helped pt's sister and Case Management with modification ideas to their home based on equipment needs.  It was decided that in addition to some of the equipment pt would need a hospital bed to expedite the discharge home.  Also for safety, I feel pt/family need 2 hoyer pads to go with the lift and 2 incontinence pads to fit between the pt and the hoyer pad.  Pt will likely not get any HHPT, so spent extra time discussing use of hoyer to get pt through doors and into w/c that will no fit through bed room door.  Also made pt aware that their furniture  (couch) etc would need to be raised on risers if pt was to be hoyered to those surfaces.   05/16/16 1700  PT Treatments  $Self Care/Home Management 8-22   05/16/2016  Moca Bing, PT [phone]  (pager)

## 2016-05-16 NOTE — Progress Notes (Signed)
Subjective: Interval History: has no complaint , slowly better.  Objective: Vital signs in last 24 hours: Temp:  [98 F (36.7 C)-100 F (37.8 C)] 100 F (37.8 C) (04/05 1041) Pulse Rate:  [86-99] 99 (04/05 1041) Resp:  [16-26] 18 (04/05 1041) BP: (119-169)/(55-82) 138/65 (04/05 1041) SpO2:  [90 %-100 %] 95 % (04/05 1041) Weight:  [122 kg (268 lb 15.4 oz)] 122 kg (268 lb 15.4 oz) (04/05 0459) Weight change:   Intake/Output from previous day: 04/04 0701 - 04/05 0700 In: 230 [P.O.:120; IV Piggyback:110] Out: 420 [Urine:420] Intake/Output this shift: No intake/output data recorded.  General appearance: alert, cooperative, no distress and morbidly obese Resp: diminished breath sounds bilaterally Chest wall: IJ PC Cardio: S1, S2 normal and systolic murmur: holosystolic 2/6, blowing at apex GI: massive, pos bs chronic changes 3+ pre sacralremities: edema chronic changes 3+ pre sacral and bandages on thighs,  AVG LUA  Lab Results:  Recent Labs  05/14/16 2113 05/15/16 0713  WBC 14.7* 15.0*  HGB 8.3* 8.5*  HCT 27.6* 26.4*  PLT 225 245   BMET:  Recent Labs  05/14/16 0712 05/14/16 2113  NA 137 137  K 4.3 4.7  CL 98* 101  CO2 27 25  GLUCOSE 179* 171*  BUN 36* 45*  CREATININE 3.17* 3.41*  CALCIUM 9.0 8.9   No results for input(s): PTH in the last 72 hours. Iron Studies: No results for input(s): IRON, TIBC, TRANSFERRIN, FERRITIN in the last 72 hours.  Studies/Results: No results found.  I have reviewed the patient's current medications.  Assessment/Plan: 1 ESRD HD tomorrow. Lower vol/solute 2 anemia esa/Fe 3 DM controlled 4 HPTH vit D,  5 Leg ulcers per primary 6 Massive obesity 7 Debill needs mobilization P Hd, mobilize, vit D, esa, CLIP    LOS: 15 days   Rickia Freeburg L 05/16/2016,11:05 AM

## 2016-05-17 ENCOUNTER — Inpatient Hospital Stay (HOSPITAL_COMMUNITY): Payer: Medicaid Other

## 2016-05-17 LAB — GLUCOSE, CAPILLARY
GLUCOSE-CAPILLARY: 190 mg/dL — AB (ref 65–99)
GLUCOSE-CAPILLARY: 152 mg/dL — AB (ref 65–99)
Glucose-Capillary: 163 mg/dL — ABNORMAL HIGH (ref 65–99)
Glucose-Capillary: 157 mg/dL — ABNORMAL HIGH (ref 65–99)

## 2016-05-17 LAB — CBC
HEMATOCRIT: 25.9 % — AB (ref 36.0–46.0)
HEMOGLOBIN: 7.9 g/dL — AB (ref 12.0–15.0)
MCH: 27.1 pg (ref 26.0–34.0)
MCHC: 30.5 g/dL (ref 30.0–36.0)
MCV: 88.7 fL (ref 78.0–100.0)
Platelets: 257 10*3/uL (ref 150–400)
RBC: 2.92 MIL/uL — ABNORMAL LOW (ref 3.87–5.11)
RDW: 15 % (ref 11.5–15.5)
WBC: 14.9 10*3/uL — AB (ref 4.0–10.5)

## 2016-05-17 LAB — RENAL FUNCTION PANEL
ALBUMIN: 2 g/dL — AB (ref 3.5–5.0)
Anion gap: 15 (ref 5–15)
BUN: 52 mg/dL — AB (ref 6–20)
CO2: 25 mmol/L (ref 22–32)
CREATININE: 4.38 mg/dL — AB (ref 0.44–1.00)
Calcium: 9.2 mg/dL (ref 8.9–10.3)
Chloride: 95 mmol/L — ABNORMAL LOW (ref 101–111)
GFR calc Af Amer: 14 mL/min — ABNORMAL LOW (ref >60)
GFR, EST NON AFRICAN AMERICAN: 12 mL/min — AB (ref >60)
GLUCOSE: 206 mg/dL — AB (ref 65–99)
POTASSIUM: 3.9 mmol/L (ref 3.5–5.1)
Phosphorus: 4.1 mg/dL (ref 2.5–4.6)
Sodium: 135 mmol/L (ref 135–145)

## 2016-05-17 LAB — URINE CULTURE: CULTURE: NO GROWTH

## 2016-05-17 MED ORDER — LIDOCAINE-PRILOCAINE 2.5-2.5 % EX CREA: 1.0000 "application " | TOPICAL_CREAM | CUTANEOUS | Status: DC | PRN

## 2016-05-17 MED ORDER — SODIUM CHLORIDE 0.9 % IV SOLN: 100.0000 mL | INTRAVENOUS | Status: DC | PRN

## 2016-05-17 MED ORDER — ALBUTEROL SULFATE (2.5 MG/3ML) 0.083% IN NEBU
INHALATION_SOLUTION | RESPIRATORY_TRACT | Status: AC
  Administered 2016-05-17: 2.5 mg
  Filled 2016-05-17: qty 3

## 2016-05-17 MED ORDER — RENA-VITE PO TABS: 1.0000 | ORAL_TABLET | Freq: Every day | ORAL | 0 refills | Status: AC

## 2016-05-17 MED ORDER — DARBEPOETIN ALFA 150 MCG/0.3ML IJ SOSY
PREFILLED_SYRINGE | INTRAMUSCULAR | Status: AC
  Administered 2016-05-17: 150 ug via INTRAVENOUS
  Filled 2016-05-17: qty 0.3

## 2016-05-17 MED ORDER — NEPRO/CARBSTEADY PO LIQD: 237.0000 mL | Freq: Three times a day (TID) | ORAL | 0 refills | Status: DC

## 2016-05-17 MED ORDER — HEPARIN SODIUM (PORCINE) 1000 UNIT/ML DIALYSIS: 1000.0000 [IU] | INTRAMUSCULAR | Status: DC | PRN

## 2016-05-17 MED ORDER — HEPARIN SODIUM (PORCINE) 1000 UNIT/ML DIALYSIS
100.0000 [IU]/kg | INTRAMUSCULAR | Status: DC | PRN
  Administered 2016-05-17: 12200 [IU] via INTRAVENOUS_CENTRAL

## 2016-05-17 MED ORDER — PENTAFLUOROPROP-TETRAFLUOROETH EX AERO
1.0000 | INHALATION_SPRAY | CUTANEOUS | Status: DC | PRN
Start: 2016-05-17 — End: 2016-05-17

## 2016-05-17 MED ORDER — LIDOCAINE HCL (PF) 1 % IJ SOLN: 5.0000 mL | INTRAMUSCULAR | Status: DC | PRN

## 2016-05-17 MED ORDER — CALCIUM ACETATE (PHOS BINDER) 667 MG PO CAPS: 667.0000 mg | ORAL_CAPSULE | Freq: Three times a day (TID) | ORAL | 0 refills | Status: DC

## 2016-05-17 MED ORDER — ALTEPLASE 2 MG IJ SOLR: 2.0000 mg | Freq: Once | INTRAMUSCULAR | Status: DC | PRN

## 2016-05-17 MED ORDER — MORPHINE SULFATE (PF) 4 MG/ML IV SOLN
INTRAVENOUS | Status: AC
  Administered 2016-05-17: 1 mg
  Filled 2016-05-17: qty 1

## 2016-05-17 MED ORDER — HYDROCODONE-ACETAMINOPHEN 5-325 MG PO TABS
ORAL_TABLET | ORAL | Status: AC
  Administered 2016-05-17: 1
  Filled 2016-05-17: qty 1

## 2016-05-17 MED ORDER — SENNOSIDES-DOCUSATE SODIUM 8.6-50 MG PO TABS: 1.0000 | ORAL_TABLET | Freq: Two times a day (BID) | ORAL | 0 refills | Status: AC

## 2016-05-17 MED ORDER — INSULIN GLARGINE 100 UNIT/ML ~~LOC~~ SOLN
8.0000 [IU] | Freq: Every day | SUBCUTANEOUS | Status: DC
  Administered 2016-05-18 (×2): 8 [IU] via SUBCUTANEOUS
  Filled 2016-05-17 (×4): qty 0.08

## 2016-05-17 NOTE — Progress Notes (Signed)
PROGRESS NOTE  [name]  ZOX:[id number] DOB: [birth year] DOA: 05/01/2016 PCP: Marletta Lor, NP  Brief Narrative:  41 y.o.femalewith medical history significant formorbid obesity, chronic kidney disease stage IV-V, diabetes mellitus, hypertension, prior CVA, diastolic CHF who presented to ED with progressive weakness, fever for past 1 week prior to this admission. She is actually bed bound for past year or so per her husband report ever since stroke. She also had dry cough and shortness of breath for past 1 week.  Her creatinine was 7.9 (much worse since her recent Cr values of 4-5).  She was started on hemodialysis and underwent left upper arm AV graft placement on 4/3.  She was anticipating going home with her family but because she is too weak to assist with transfers and is morbidly obese, she would need a hoyer lift, hospital bed, and wheelchair for transport to dialysis.  Unfortunately, she was given a hospital bed last year which was "lost" and no home health agencies in the region will provide her with equipment.  She was told today that she will need to go to SNF for [facility] term rehabilitation to regain enough strength to at least transfer from a low bed/mattress to a chair in order to get to HD.  PT has already recommended SNF.  OT consult patient.  Medicaid patient.    Assessment & Plan:   Principal Problem:   Acute metabolic encephalopathy Active Problems:   Morbid obesity (HCC)   Essential hypertension   History of CVA with residual deficit   Anemia of chronic disease   Lymphedema   Debility   Acute pulmonary edema (HCC)   Acute renal failure superimposed on stage 5 chronic kidney disease, not on chronic dialysis (HCC)   Acute respiratory failure with hypoxia (HCC)   Right middle lobe pneumonia (HCC)   Leukocytosis   Uncontrolled diabetes mellitus with diabetic nephropathy, with long-term current use of insulin (HCC)   Dyslipidemia associated with type 2 diabetes  mellitus (HCC)   ESRD (end stage renal disease) (HCC)   Goals of care, counseling/discussion   Palliative care by specialist  ESRD with acute pulmonary edema  - Due to progressive disease from uncontrolled diabetes and FSG. Patient was placed on aggressive IV Lasix and metolazone per renal recommendations. - Tunneled dialysis cath Placed on 3/27. Hemodialysis initiated 3/28, then 3/29, HD 4/1, HD 4/2 - Accepted at [facility] [address]  - Vascular surgery also following, AV graft placement 4/3 -  Able to sit in chair for HD  Acute respiratory failure with hypoxia with possible right middle lobe pneumonia vs. Atelectasis on CXR.  Persistent fevers, leukocytosis despite more than a week of doxycycline and the addition of ceftaz   -  Blood cultures 1/2 showed coagnegative staph likely contaminant  -  Repeat blood cultures NGTD  Morbid obesity / Debility/ generalized weakness.  Unable to assist with transfers -  No home health agency will provide equipment and services to home because of lost equipment in the past -  SW consulted for SNF - Nutrition consulted - Body mass index is 54.58 kg/m.  Acute metabolic encephalopathy - Improved, at baseline mental status - Likely multifactorial from history of CVA, end stage renal disease, possible uremia, acute infectious process - possible pneumonia  Chest pain episode  -  Troponin 1 was 0.04 however d-dimer was 2.01 - VQ scan Negative for any PE  Abnormal UA, culture negative.  Foley removed.  Anemia of chronic disease /  IDA - Due to CKD - Status post 2 units PRBC transfusion on 05/02/2016  - Aranesp 3/22, continue iron supplementation, B12  Uncontrolled diabetes mellitus with diabetic nephropathy with long term insulin use - Continue current insulin regimen: SSI - CBGs elevated, increase to 8 units Lantus at bedtime  Dyslipidemia associated with type 2 DM - Continue statin therapy   Essential hypertension -  Continue Norvasc, coreg, hydralazine, Imdur   Bilateral inner thigh wounds - Related to moisture and friction from thighs rubbing together - Dressing procedure/placement/frequency: Cleanse with NS, gently pat dry, apply Xeroform gauze, top with dry gauze,change every shift. Pt is already on a low air loss mattress   DVT prophylaxis:  heparin Code Status:  full Family Communication:  Patient alone Disposition Plan:   Will need to go to SNF.  Family unable to transfer patient from low bed to chair so she will therefore be unable to go to dialysis from home.    Consultants:   Nephrology  WOC  Palliative care   Nutrition  Vascular surgery   Procedures:  Hemodialysis is initiated 3/28  Antimicrobials:  Anti-infectives    Start     Dose/Rate Route Frequency Ordered Stop   05/16/16 1630  cefTRIAXone (ROCEPHIN) 1 g in dextrose 5 % 50 mL IVPB     1 g 100 mL/hr over 30 Minutes Intravenous Every 24 hours 05/16/16 1625     05/14/16 1430  cefTAZidime (FORTAZ) 1 g in dextrose 5 % 50 mL IVPB  Status:  Discontinued     1 g 100 mL/hr over 30 Minutes Intravenous Every 24 hours 05/14/16 1406 05/15/16 1325   05/14/16 1430  doxycycline (VIBRA-TABS) tablet 100 mg  Status:  Discontinued     100 mg Oral Every 12 hours 05/14/16 1406 05/15/16 1325   05/14/16 0000  ceFAZolin (ANCEF) IVPB 1 g/50 mL premix  Status:  Discontinued    Comments:  Send with pt to OR   1 g 100 mL/hr over 30 Minutes Intravenous On call 05/13/16 1630 05/15/16 1325   05/12/16 1330  cefTAZidime (FORTAZ) 1 g in dextrose 5 % 50 mL IVPB  Status:  Discontinued     1 g 100 mL/hr over 30 Minutes Intravenous Every 24 hours 05/12/16 1319 05/14/16 1406   05/08/16 1000  vancomycin (VANCOCIN) IVPB 1000 mg/200 mL premix     1,000 mg 200 mL/hr over 60 Minutes Intravenous To Surgery 05/07/16 1904 05/08/16 1004   05/08/16 0902  vancomycin (VANCOCIN) 1-5 GM/200ML-% IVPB    Comments:  Matilde Haymaker, [name]   : cabinet override       05/08/16 0902 05/08/16 0904   05/07/16 1300  ceFAZolin (ANCEF) 3 g in dextrose 5 % 50 mL IVPB     3 g 130 mL/hr over 30 Minutes Intravenous To Radiology 05/07/16 1232 05/07/16 1330   05/02/16 2200  doxycycline (VIBRA-TABS) tablet 100 mg  Status:  Discontinued     100 mg Oral Every 12 hours 05/02/16 1732 05/14/16 1406   05/01/16 2200  piperacillin-tazobactam (ZOSYN) IVPB 2.25 g  Status:  Discontinued     2.25 g 100 mL/hr over 30 Minutes Intravenous Every 8 hours 05/01/16 1324 05/02/16 1729   05/01/16 1330  vancomycin (VANCOCIN) 2,500 mg in sodium chloride 0.9 % 500 mL IVPB     2,500 mg 250 mL/hr over 120 Minutes Intravenous  Once 05/01/16 1309 05/01/16 1551   05/01/16 1300  piperacillin-tazobactam (ZOSYN) IVPB 3.375 g     3.375 g 100 mL/hr over  30 Minutes Intravenous  Once 05/01/16 1258 05/01/16 1340   05/01/16 1300  vancomycin (VANCOCIN) IVPB 1000 mg/200 mL premix  Status:  Discontinued     1,000 mg 200 mL/hr over 60 Minutes Intravenous  Once 05/01/16 1258 05/01/16 1308       Subjective:  Wants to go home.  Does not want to go to SNF.  Had some SOB overnight that spontaneously resolved. Denies chest pains.  Not eating much.    Objective: Vitals:   05/17/16 1300 05/17/16 1330 05/17/16 1400 05/17/16 1406  BP: (!) 115/46 (!) 125/54 128/86 124/61  Pulse: 92 93 95 93  Resp:   18 (!) 23  Temp:   97.5 F (36.4 C) 97.5 F (36.4 C)  TempSrc:    Oral  SpO2:   100% 100%  Weight:      Height:        Intake/Output Summary (Last 24 hours) at 05/17/16 1637 Last data filed at 05/17/16 1406  Gross per 24 hour  Intake               50 ml  Output             3800 ml  Net            -3750 ml   Filed Weights   05/15/16 2201 05/16/16 0459 05/17/16 0622  Weight: 122 kg (268 lb 15.4 oz) 122 kg (268 lb 15.4 oz) 112 kg (246 lb 14.6 oz)    Examination:  General exam:  Adult female, obese.  Seen at HD and again this afternoon in her room.  No acute distress.  Unable to sit up in bed.     HEENT:  NCAT, MMM Respiratory system: Clear to auscultation bilaterally Cardiovascular system: Regular rate and rhythm, normal S1/S2. No murmurs, rubs, gallops or clicks.  Warm extremities Gastrointestinal system: Normal active bowel sounds, soft, nondistended, nontender. MSK:  Normal tone and bulk, legs appear wrinkled as if previous swelling has improved.  1+ edema bilateral lower extremities Neuro:  Diffusely weak, decreased muscle tone and bulk    Data Reviewed: I have personally reviewed following labs and imaging studies  CBC:  Recent Labs Lab 05/13/16 0735 05/14/16 0712 05/14/16 2113 05/15/16 0713 05/17/16 0926  WBC 15.0* 15.7* 14.7* 15.0* 14.9*  HGB 8.5* 8.7* 8.3* 8.5* 7.9*  HCT 28.7* 28.9* 27.6* 26.4* 25.9*  MCV 90.8 90.9 89.6 90.7 88.7  PLT 251 254 225 245 257   Basic Metabolic Panel:  Recent Labs Lab 05/11/16 0747 05/12/16 1611 05/14/16 0712 05/14/16 2113 05/17/16 0926  NA 146* 141 137 137 135  K 4.3 5.5* 4.3 4.7 3.9  CL 104 102 98* 101 95*  CO2 GLUCOSE 182* 224* 179* 171* 206*  BUN 74* 54* 36* 45* 52*  CREATININE 5.07* 4.11* 3.17* 3.41* 4.38*  CALCIUM 9.5 9.4 9.0 8.9 9.2  PHOS 4.3 3.5  --  3.5 4.1   GFR: Estimated Creatinine Clearance: 21.9 mL/min (A) (by C-G formula based on SCr of 4.38 mg/dL (H)). Liver Function Tests:  Recent Labs Lab 05/11/16 0747 05/12/16 1611 05/14/16 2113 05/17/16 0926  ALBUMIN 2.1* 2.1* 1.8* 2.0*   No results for input(s): LIPASE, AMYLASE in the last 168 hours. No results for input(s): AMMONIA in the last 168 hours. Coagulation Profile: No results for input(s): INR, PROTIME in the last 168 hours. Cardiac Enzymes: No results for input(s): CKTOTAL, CKMB, CKMBINDEX, TROPONINI in the last 168 hours. BNP (last 3 results)  No results for input(s): PROBNP in the last 8760 hours. HbA1C: No results for input(s): HGBA1C in the last 72 hours. CBG:  Recent Labs Lab 05/16/16 1134 05/16/16 1636  05/16/16 2249 05/17/16 0801 05/17/16 1448  GLUCAP 148* 171* 189* 190* 163*   Lipid Profile: No results for input(s): CHOL, HDL, LDLCALC, TRIG, CHOLHDL, LDLDIRECT in the last 72 hours. Thyroid Function Tests: No results for input(s): TSH, T4TOTAL, FREET4, T3FREE, THYROIDAB in the last 72 hours. Anemia Panel: No results for input(s): VITAMINB12, FOLATE, FERRITIN, TIBC, IRON, RETICCTPCT in the last 72 hours. Urine analysis:    Component Value Date/Time   COLORURINE AMBER (A) 05/16/2016 1142   APPEARANCEUR TURBID (A) 05/16/2016 1142   LABSPEC 1.019 05/16/2016 1142   PHURINE 5.0 05/16/2016 1142   GLUCOSEU NEGATIVE 05/16/2016 1142   HGBUR MODERATE (A) 05/16/2016 1142   BILIRUBINUR NEGATIVE 05/16/2016 1142   KETONESUR NEGATIVE 05/16/2016 1142   PROTEINUR 100 (A) 05/16/2016 1142   UROBILINOGEN 0.2 10/26/2013 1721   NITRITE NEGATIVE 05/16/2016 1142   LEUKOCYTESUR MODERATE (A) 05/16/2016 1142   Sepsis Labs: (procalcitonin:4,lacticidven:4)  ) Recent Results (from the past 240 hour(s))  MRSA PCR Screening     Status: None   Collection Time: 05/13/16 11:26 AM  Result Value Ref Range Status   MRSA by PCR NEGATIVE NEGATIVE Final    Comment:        The GeneXpert MRSA Assay (FDA approved for NASAL specimens only), is one component of a comprehensive MRSA colonization surveillance program. It is not intended to diagnose MRSA infection nor to guide or monitor treatment for MRSA infections.   Culture, blood (Routine X 2) w Reflex to ID Panel     Status: None (Preliminary result)   Collection Time: 05/15/16  9:03 PM  Result Value Ref Range Status   Specimen Description BLOOD RIGHT HAND  Final   Special Requests   Final    BOTTLES DRAWN AEROBIC ONLY Blood Culture adequate volume   Culture NO GROWTH 2 DAYS  Final   Report Status PENDING  Incomplete  Culture, blood (Routine X 2) w Reflex to ID Panel     Status: None (Preliminary result)   Collection Time: 05/15/16  9:10 PM   Result Value Ref Range Status   Specimen Description BLOOD RIGHT HAND  Final   Special Requests IN PEDIATRIC BOTTLE Blood Culture adequate volume  Final   Culture NO GROWTH 2 DAYS  Final   Report Status PENDING  Incomplete  Culture, Urine     Status: None   Collection Time: 05/16/16 11:42 AM  Result Value Ref Range Status   Specimen Description URINE, RANDOM  Final   Special Requests NONE  Final   Culture NO GROWTH  Final   Report Status 05/17/2016 FINAL  Final      Radiology Studies: Dg Chest Port 1 View  Result Date: 05/17/2016 CLINICAL DATA:  Dyspnea EXAM: PORTABLE CHEST 1 VIEW COMPARISON:  05/06/2016 FINDINGS: Right jugular central venous catheter in the mid SVC. Cardiac enlargement. Negative for edema or effusion. Lungs are clear. IMPRESSION: No active disease. Electronically Signed   By: Marlan Palau M.D.   On: 05/17/2016 08:53     Scheduled Meds: . albuterol  3 mL Inhalation Q M,W,F-HD  . amLODipine  10 mg Oral QHS  . atorvastatin  20 mg Oral Daily  . calcium acetate  667 mg Oral TID WC  . carvedilol  6.25 mg Oral BID WC  . cefTRIAXone (ROCEPHIN)  IV  1 g  Intravenous Q24H  . darbepoetin (ARANESP) injection - DIALYSIS  150 mcg Intravenous Q Fri-HD  . feeding supplement (NEPRO CARB STEADY)  237 mL Oral TID BM  . ferric gluconate (FERRLECIT/NULECIT) IV  125 mg Intravenous Q M,W,F-HD  . gi cocktail  30 mL Oral Once  . heparin  5,000 Units Subcutaneous Q8H  . hydrALAZINE  50 mg Oral BID  . insulin aspart  0-5 Units Subcutaneous QHS  . insulin aspart  0-9 Units Subcutaneous TID WC  . insulin glargine  5 Units Subcutaneous QHS  . isosorbide mononitrate  30 mg Oral Daily  . multivitamin  1 tablet Oral QHS  . polyethylene glycol  17 g Oral Daily  . senna-docusate  1 tablet Oral BID   Continuous Infusions:   LOS: 16 days    Time spent: 30 min    Renae Fickle, MD [facility] Pager [phone]  If 7PM-7AM, please contact  night-coverage [email or web address] Password [facility] 05/17/2016, 4:37 PM

## 2016-05-17 NOTE — Procedures (Signed)
I was present at this session.  I have reviewed the session itself and made appropriate changes.  HD via PC. Flow 350.  tol vol off. bp in low 100s.   Kalleigh Harbor L 4/6/201811:17 AM

## 2016-05-17 NOTE — Care Management Note (Addendum)
Case Management Note  Patient Details  Name: [name] MRN: [id number] Date of Birth: 1975-11-12  Subjective/Objective:     CM following for progression and d/c planning.               Action/Plan: 05/17/2016 Met with pt, husband, sister, mother and other family member on 05/16/16 re pt d/c needs. They are requesting a hoyer lift, then we discovered that pt bed is a on the floor family would be unable to place a hoyer lift under the bed to lift the pt. Pt reports that she has a wheelchair which is "broken". After talking with family it was decided that the pt would require a hospital bed, hoyer lift and wheelchair. PT eval and determined size of wheelchair. This CM placed order with [facility]. This AM this CM contacted [place] to arrange delivery of these items and learned from [facility] that this pt had received a hospital bed in 2017 alone with several other items ( trapeze) and that these items were "lost" or discarded by the pt/family. Pt is no longer eligible for these items. This CM contacted [facility] and was told by that provider that this pt will not be eligible for more DME for 59yrfrom 2017 .  Spoke with CM assist director , SDeveron Furlongre this situation and possible suggestions for moving forward with d/c of this pt.  Will discuss further options with pt after HD today.  Met with Dr SSheran Favaand pt to discuss d/c needs, as we are unable to obtain DME due to pt "lost" of hospital bed and other DME last year. Pt will need short term SNF for ongoing care and nursing rehab, additionally this will assure that the pt is prepared and transported to HD as needed.  Pt upset about this stating that her family can do this and they will buy the DME. Cost discussed with pt and lack of ability of family to care for the pt at this time( sister is exhausted and unable to lift and manage this pt and husband has right arm injury).  Expected Discharge Date:  05/17/16               Expected Discharge  Plan:  [place] Referral:  NA  Discharge planning Services  CM Consult  Post Acute Care Choice:  Durable Medical Equipment, Home Health Choice offered to:  Patient  DME Arranged:  Wheelchair manual, [facility] D[facility] Agency:  [facility]  RN, PT, OT, Nurse's Aide, Social Work [employer]:  [place] Status of Service:  In process, will continue to follow  If discussed at Long Length of Stay Meetings, dates discussed:    Additional Comments:  RAdron Bene RN 05/17/2016, 1:21 PM

## 2016-05-17 NOTE — [place] FL2 (Signed)
[place] MEDICAID FL2 LEVEL OF CARE SCREENING TOOL     IDENTIFICATION  Patient Name: [name] Birthdate: June 04, 1975 Sex: female Admission Date (Current Location): 05/01/2016  [place] and [place] Number:  Haynes Bast [id number] O Facility and Address:  [facility], [address], [place], [place] [address]      Provider Number: [id number]  Attending Physician Name and Address:  Renae Fickle, MD  Relative Name and Phone Number:  Jomarie Longs [phone] (h),  [phone] (mobile)     Current Level of Care: Hospital Recommended Level of Care: Skilled Nursing Facility Prior Approval Number:    Date Approved/Denied:   PASRR Number: [id number] (Eff. 09/03/13)  Discharge Plan: SNF    Current Diagnoses: Patient Active Problem List   Diagnosis Date Noted  . ESRD (end stage renal disease) (HCC)   . Goals of care, counseling/discussion   . Palliative care by specialist   . Acute metabolic encephalopathy 05/04/2016  . Acute renal failure superimposed on stage 5 chronic kidney disease, not on chronic dialysis (HCC) 05/04/2016  . Acute respiratory failure with hypoxia (HCC) 05/04/2016  . Right middle lobe pneumonia (HCC) 05/04/2016  . Leukocytosis 05/04/2016  . Uncontrolled diabetes mellitus with diabetic nephropathy, with long-term current use of insulin (HCC) 05/04/2016  . Dyslipidemia associated with type 2 diabetes mellitus (HCC) 05/04/2016  . Acute pulmonary edema (HCC)   . History of CVA with residual deficit   . Anemia of chronic disease   . Lymphedema   . Debility   . Essential hypertension   . Morbid obesity (HCC) 02/05/2015    Orientation RESPIRATION BLADDER Height & Weight     Self, Time, Situation, Place  Normal Continent Weight: 246 lb 14.6 oz (112 kg) Height:   (167.6 cm)  BEHAVIORAL SYMPTOMS/MOOD NEUROLOGICAL BOWEL NUTRITION STATUS      Incontinent Diet (Renal carb modified with fluid restriction)   AMBULATORY STATUS COMMUNICATION OF NEEDS Skin   Total Care (Patient unable to ambulate with PT) Verbally Other (Comment) (MASD to thighs, incision to left arm, wound to right and left thigh)   PU Stage 2 Dressing:  (on thigh- impregnated gauze)                   Personal Care Assistance Level of Assistance  Bathing, Feeding, Dressing Bathing Assistance: Maximum assistance (For bed mobility patient is Max assist;+2 for physical assistance) Feeding assistance: Independent Dressing Assistance: Maximum assistance (See bathing comment)     Functional Limitations Info  Sight, Hearing, Speech Sight Info: Adequate Hearing Info: Adequate Speech Info: Adequate    SPECIAL CARE FACTORS FREQUENCY  PT (By licensed PT)     PT Frequency: Evaluated 3/23 and a minimum of 3X per week therapy reocmmeended OT Frequency: 5/wk            Contractures Contractures Info: Not present    Additional Factors Info  Code Status, Allergies, Insulin Sliding Scale Code Status Info: Full Allergies Info: Azithromycin   Insulin Sliding Scale Info: 0-9 Units 3X per day with meals and 0-5 Units daily at bedtime       Current Medications (05/17/2016):  This is the current hospital active medication list Current Facility-Administered Medications  Medication Dose Route Frequency Provider Last Rate Last Dose  . acetaminophen (TYLENOL) tablet 650 mg  650 mg Oral Q4H PRN Calvert Cantor, MD   650 mg at 05/09/16 1030  . albuterol (PROVENTIL) (2.5 MG/3ML) 0.083% nebulizer solution 2.5 mg  2.5 mg Nebulization Q6H PRN Costin  Otelia Sergeant, MD   2.5 mg at 05/03/16 1617  . albuterol (PROVENTIL) (2.5 MG/3ML) 0.083% nebulizer solution 3 mL  3 mL Inhalation Q M,W,F-HD Camille Bal, MD   3 mL at 05/17/16 1210  . amLODipine (NORVASC) tablet 10 mg  10 mg Oral QHS Beryle Lathe, MD   10 mg at 05/16/16 2258  . atorvastatin (LIPITOR) tablet 20 mg  20 mg Oral Daily Ripudeep K Rai, MD   20 mg at 05/17/16 1745  . calcium acetate  (PHOSLO) capsule 667 mg  667 mg Oral TID WC Ripudeep Jenna Luo, MD   667 mg at 05/17/16 1558  . camphor-menthol (SARNA) lotion 1 application  1 application Topical Q8H PRN Costin Otelia Sergeant, MD      . carvedilol (COREG) tablet 6.25 mg  6.25 mg Oral BID WC Beryle Lathe, MD   6.25 mg at 05/17/16 1557  . Darbepoetin Alfa (ARANESP) injection 150 mcg  150 mcg Intravenous Q Fri-HD Camille Bal, MD   150 mcg at 05/17/16 1229  . feeding supplement (NEPRO CARB STEADY) liquid 237 mL  237 mL Oral TID BM Ripudeep K Rai, MD   237 mL at 05/17/16 1557  . ferric gluconate (NULECIT) 125 mg in sodium chloride 0.9 % 100 mL IVPB  125 mg Intravenous Q M,W,F-HD Camille Bal, MD   125 mg at 05/17/16 1258  . gi cocktail (Maalox,Lidocaine,Donnatal)  30 mL Oral Once Beryle Lathe, MD      . guaiFENesin-dextromethorphan [facility] DM) 100-10 MG/5ML syrup 5 mL  5 mL Oral Q4H PRN Leatha Gilding, MD   5 mL at 05/01/16 1620  . heparin injection 5,000 Units  5,000 Units Subcutaneous Q8H Samantha J Rhyne, PA-C   5,000 Units at 05/17/16 1557  . hydrALAZINE (APRESOLINE) tablet 50 mg  50 mg Oral BID Beryle Lathe, MD   50 mg at 05/17/16 1557  . HYDROcodone-acetaminophen (NORCO) 10-325 MG per tablet 1 tablet  1 tablet Oral Q6H PRN Bobette Mo, MD   1 tablet at 05/16/16 1146  . insulin aspart (novoLOG) injection 0-5 Units  0-5 Units Subcutaneous QHS Leatha Gilding, MD   Stopped at 05/14/16 2214  . insulin aspart (novoLOG) injection 0-9 Units  0-9 Units Subcutaneous TID WC Ripudeep Jenna Luo, MD   2 Units at 05/17/16 1745  . insulin glargine (LANTUS) injection 8 Units  8 Units Subcutaneous QHS Renae Fickle, MD      . isosorbide mononitrate (IMDUR) 24 hr tablet 30 mg  30 mg Oral Daily Ripudeep Jenna Luo, MD   30 mg at 05/17/16 1557  . morphine 2 MG/ML injection 1 mg  1 mg Intravenous Q4H PRN Ripudeep Jenna Luo, MD   1 mg at 05/17/16 1610  . multivitamin (RENA-VIT) tablet 1 tablet  1 tablet Oral QHS Camille Bal, MD   1 tablet at  05/16/16 2258  . nitroGLYCERIN (NITROSTAT) SL tablet 0.4 mg  0.4 mg Sublingual Q5 min PRN Eduard Clos, MD   0.4 mg at 05/06/16 0217  . ondansetron (ZOFRAN) tablet 4 mg  4 mg Oral Q6H PRN Leatha Gilding, MD   4 mg at 05/07/16 2223   Or  . ondansetron (ZOFRAN) injection 4 mg  4 mg Intravenous Q6H PRN Leatha Gilding, MD   4 mg at 05/11/16 0809  . oxyCODONE-acetaminophen (PERCOCET/ROXICET) 5-325 MG per tablet 1 tablet  1 tablet Oral Q4H PRN Dara Lords, PA-C   1 tablet at 05/15/16 2156  . polyethylene glycol (MIRALAX /  GLYCOLAX) packet 17 g  17 g Oral Daily Ripudeep K Rai, MD   17 g at 05/17/16 1558  . senna-docusate (Senokot-S) tablet 1 tablet  1 tablet Oral BID Ranae Palms, NP   1 tablet at 05/17/16 1557     Discharge Medications: Please see discharge summary for a list of discharge medications.  Relevant Imaging Results:  Relevant Lab Results:   Additional Information ss#[id number]. Dialysis patient-set up at [facility] MWF, 2nd shift.  Cristobal Goldmann, LCSW

## 2016-05-17 NOTE — Clinical Social Work Note (Signed)
CSW advised by MD that patient will discharge to a skilled nursing facility. FL-2 updated and sent out to previous accepting and pending skilled nursing facilities. Patient will be provided with facility responses to made decision for ST rehab.  Genelle Bal, MSW, LCSW Licensed Clinical Social Worker Clinical Social Work Department [employer] [phone]

## 2016-05-17 NOTE — Progress Notes (Signed)
Subjective: Interval History: has no complaint.  Objective: Vital signs in last 24 hours: Temp:  [98.6 F (37 C)-99.1 F (37.3 C)] 98.6 F (37 C) (04/06 0905) Pulse Rate:  [95-104] 101 (04/06 1100) Resp:  [18-20] 20 (04/06 0905) BP: (105-141)/(61-88) 105/67 (04/06 1100) SpO2:  [96 %-100 %] 96 % (04/06 0905) Weight:  [112 kg (246 lb 14.6 oz)] 112 kg (246 lb 14.6 oz) (04/06 0622) Weight change: -10 kg (-22 lb 0.7 oz)  Intake/Output from previous day: 04/05 0701 - 04/06 0700 In: 170 [P.O.:120; IV Piggyback:50] Out: 0  Intake/Output this shift: No intake/output data recorded.  General appearance: cooperative, morbidly obese and slowed mentation Resp: diminished breath sounds bilaterally and rales bibasilar Chest wall: RIJ cath Cardio: S1, S2 normal and systolic murmur: holosystolic 2/6, blowing at apex GI: obese, pos soft pitting and chronic changes, remities: edema 3+ and AVG LUA  Lab Results:  Recent Labs  05/15/16 0713 05/17/16 0926  WBC 15.0* 14.9*  HGB 8.5* 7.9*  HCT 26.4* 25.9*  PLT 245 257   BMET:  Recent Labs  05/14/16 2113 05/17/16 0926  NA 137 135  K 4.7 3.9  CL 101 95*  CO2 25 25  GLUCOSE 171* 206*  BUN 45* 52*  CREATININE 3.41* 4.38*  CALCIUM 8.9 9.2   No results for input(s): PTH in the last 72 hours. Iron Studies: No results for input(s): IRON, TIBC, TRANSFERRIN, FERRITIN in the last 72 hours.  Studies/Results: Dg Chest Port 1 View  Result Date: 05/17/2016 CLINICAL DATA:  Dyspnea EXAM: PORTABLE CHEST 1 VIEW COMPARISON:  05/06/2016 FINDINGS: Right jugular central venous catheter in the mid SVC. Cardiac enlargement. Negative for edema or effusion. Lungs are clear. IMPRESSION: No active disease. Electronically Signed   By: Marlan Palau M.D.   On: 05/17/2016 08:53    I have reviewed the patient's current medications.  Assessment/Plan: 1 ESRD 30 kg down..  Still vol xs.  tol HD well. Social issues to get done prior to d/c 2 Anemia esa/Fe 3 DM  controlled 4 Debill 5 leg ulcers 6 HPTH obesity P Hd, esa, wound care SW,     LOS: 16 days   Oneika Simonian L 05/17/2016,11:17 AM

## 2016-05-18 LAB — GLUCOSE, CAPILLARY
GLUCOSE-CAPILLARY: 163 mg/dL — AB (ref 65–99)
GLUCOSE-CAPILLARY: 184 mg/dL — AB (ref 65–99)
Glucose-Capillary: 177 mg/dL — ABNORMAL HIGH (ref 65–99)
Glucose-Capillary: 194 mg/dL — ABNORMAL HIGH (ref 65–99)

## 2016-05-18 MED ORDER — AMLODIPINE BESYLATE 5 MG PO TABS
5.0000 mg | ORAL_TABLET | Freq: Every day | ORAL | Status: DC
  Administered 2016-05-18: 5 mg via ORAL
  Filled 2016-05-18: qty 1

## 2016-05-18 NOTE — Progress Notes (Signed)
Subjective: Interval History: has no complaint .  Objective: Vital signs in last 24 hours: Temp:  [97.5 F (36.4 C)-100.5 F (38.1 C)] 100.5 F (38.1 C) (04/07 0951) Pulse Rate:  [92-107] 107 (04/07 0951) Resp:  [18-23] 18 (04/07 0951) BP: (106-139)/(46-86) 109/66 (04/07 0951) SpO2:  [96 %-100 %] 96 % (04/07 0951) Weight:  [111.8 kg (246 lb 8 oz)] 111.8 kg (246 lb 8 oz) (04/07 1610) Weight change: -0.188 kg (-6.6 oz)  Intake/Output from previous day: 04/06 0701 - 04/07 0700 In: 0  Out: 3800  Intake/Output this shift: No intake/output data recorded.  General appearance: cooperative, no distress and morbidly obese Resp: diminished breath sounds bilaterally Chest wall: IJPC Cardio: S1, S2 normal and systolic murmur: holosystolic 2/6, blowing at apex GI: massive, pos bs, liver down 7 cm, soft 2+, stasis changes chronic alsoremities: edema 2+, stasis changes chronic also and AVG LUA  Lab Results:  Recent Labs  05/17/16 0926  WBC 14.9*  HGB 7.9*  HCT 25.9*  PLT 257   BMET:  Recent Labs  05/17/16 0926  NA 135  K 3.9  CL 95*  CO2 25  GLUCOSE 206*  BUN 52*  CREATININE 4.38*  CALCIUM 9.2   No results for input(s): PTH in the last 72 hours. Iron Studies: No results for input(s): IRON, TIBC, TRANSFERRIN, FERRITIN in the last 72 hours.  Studies/Results: Dg Chest Port 1 View  Result Date: 05/17/2016 CLINICAL DATA:  Dyspnea EXAM: PORTABLE CHEST 1 VIEW COMPARISON:  05/06/2016 FINDINGS: Right jugular central venous catheter in the mid SVC. Cardiac enlargement. Negative for edema or effusion. Lungs are clear. IMPRESSION: No active disease. Electronically Signed   By: Marlan Palau M.D.   On: 05/17/2016 08:53    I have reviewed the patient's current medications.  Assessment/Plan: 1 ESRD down over 30 kg, vol xs, improving 2 HTN lower bp, lowr meds 3 Anemia esa/Fe 4 HPTH vit D 5 Massive obesity 6 DM controlled 7 Anxiety 8 Leg ulcers P HD MWF, stop coreg, hydral,  NHP, transportation    LOS: 17 days   Shynice Sigel L 05/18/2016,11:20 AM

## 2016-05-18 NOTE — Evaluation (Signed)
Occupational Therapy Evaluation Patient Details Name: [name] MRN: [id number] DOB: 1976/01/30 Today's Date: 05/18/2016    History of Present Illness 41 y.o. female with medical history significant of morbid obesity, chronic kidney disease stage IV-V, diabetes mellitus, hypertension, prior CVA, diastolic CHF, presents to the emergency room with chief complaint of weakness as well as fever and diagnosed with acute hypoxic resp failure with acute encephalopathy. S/p L AV graft on 05/14/16.    Clinical Impression   Pt admitted with weakness. Pt with limited participation during OT.  Pt currently with functional limitations due to the deficits listed below (see OT Problem List). Pt will benefit from skilled OT to increase their safety and independence with ADL and functional mobility for ADL to facilitate discharge to venue listed below.      Follow Up Recommendations  SNF    Equipment Recommendations  None recommended by OT    Recommendations for Other Services       Precautions / Restrictions Precautions Precautions: Fall Precaution Comments: bil inner thigh wounds      Mobility Bed Mobility Overal bed mobility: Needs Assistance Bed Mobility: Rolling Rolling: Max assist;+2 for physical assistance         General bed mobility comments: pt with limited participation - does not seem very motivated  Transfers                          ADL either performed or assessed with clinical judgement   ADL Overall ADL's : Needs assistance/impaired   Eating/Feeding Details (indicate cue type and reason): did not observe but RN reports pt able to do this- based on BUE assessment would assume pt needed A with self feeding. Will assess further next tx Grooming: Moderate assistance;Bed level Grooming Details (indicate cue type and reason): washing face- needed max encouragement                               General ADL Comments: pt with lmited  participation. Will need Max- total A with ADL activity .     Vision Patient Visual Report: No change from baseline Additional Comments: pt states she can see TV            Pertinent Vitals/Pain Pain Assessment: No/denies pain     Hand Dominance Right   Extremity/Trunk Assessment Upper Extremity Assessment RUE Deficits / Details: Pt and nurse state pt can feed self but pt with limited particpation in BUE assessment. Pt able to lift against gravity but not hold as instructed   Lower Extremity Assessment Lower Extremity Assessment: Generalized weakness;RLE deficits/detail;LLE deficits/detail LLE Deficits / Details: Pt able to lift against gravity but did not hold as OT ask.        Communication Communication Communication:  (not many verbalizations)   Cognition Arousal/Alertness: Lethargic Behavior During Therapy: Flat affect Overall Cognitive Status: No family/caregiver present to determine baseline cognitive functioning                                        Exercises  educated on benefits and pt agreed to this being a goal- refused to perform        Home Living Family/patient expects to be discharged to:: Skilled nursing facility Living Arrangements: Spouse/significant other Available Help at Discharge: Family;Available 24 hours/day Type of Home: Apartment  Home Access: Level entry     Home Layout: One level               Home Equipment: Bedside commode;Walker - 2 wheels;Wheelchair - Engineer, technical sales - power;Cane - quad;Shower seat   Additional Comments: pt has 3, 4, and 7 yo children      Prior Functioning/Environment Level of Independence: Needs assistance  Gait / Transfers Assistance Needed: has not walked at home since CVA. Walked some in rehab but has been afraid of falling since home and so transfers to w/c or [facility] but otherwise stays in bed (from previous admission in Feb)     Comments: pt remains poor historian at this time,  confirms using w/c for mobility at home        OT Problem List: Decreased strength;Decreased activity tolerance;Impaired UE functional use;Decreased knowledge of use of DME or AE;Decreased safety awareness;Decreased range of motion;Decreased knowledge of precautions      OT Treatment/Interventions: Self-care/ADL training;DME and/or AE instruction;Patient/family education;Therapeutic exercise;Neuromuscular education    OT Goals(Current goals can be found in the care plan section) Acute Rehab OT Goals Patient Stated Goal: did not state during OT eval OT Goal Formulation: With patient Time For Goal Achievement: 06/01/16 Potential to Achieve Goals: Fair ADL Goals Pt Will Perform Eating: with set-up;sitting Pt Will Perform Grooming: with set-up;sitting Pt/caregiver will Perform Home Exercise Program: Both right and left upper extremity;Increased ROM;Increased strength;With written HEP provided  OT Frequency: Min 2X/week              End of Session Nurse Communication: Mobility status  Activity Tolerance: Patient limited by lethargy;Other (comment) (pt with limited particpation) Patient left: in bed;with call bell/phone within reach  OT Visit Diagnosis: Muscle weakness (generalized) (M62.81)                Time: 1000-1014 OT Time Calculation (min): 14 min Charges:  OT General Charges $OT Visit: 1 Procedure OT Evaluation $OT Eval Moderate Complexity: 1 Procedure G-Codes:     Lise Auer, OT [phone]  Einar Crow D 05/18/2016, 10:30 AM

## 2016-05-18 NOTE — Clinical Social Work Note (Signed)
CSW offer choice to pt at bedside. Pt chose [name]. CSW contacted Velna Hatchet at [facility] about accepting pt today. [name] is able to accept pt, but not this weekend, as they are short staffed. CSW will continue to follow for d/c needs.  Corlis Hove, LCSWA, LCASA Clinical Social Work [phone]

## 2016-05-18 NOTE — Progress Notes (Signed)
PROGRESS NOTE  [name]  ZOX:[id number] DOB: 1975-05-01 DOA: 05/01/2016 PCP: Marletta Lor, NP  Brief Narrative:  41 y.o.femalewith medical history significant formorbid obesity, chronic kidney disease stage IV-V, diabetes mellitus, hypertension, prior CVA, diastolic CHF who presented to ED with progressive weakness, fever for past 1 week prior to this admission. She is actually bed bound for past year or so per her husband report ever since stroke. She also had dry cough and shortness of breath for past 1 week.  Her creatinine was 7.9 (much worse since her recent Cr values of 4-5).  She was started on hemodialysis and underwent left upper arm AV graft placement on 4/3.  She was anticipating going home with her family but because she is too weak to assist with transfers and is morbidly obese, she would need a hoyer lift, hospital bed, and wheelchair for transport to dialysis.  Unfortunately, she was given a hospital bed last year which was "lost" and no home health agencies in the region will provide her with equipment.  She was told today that she will need to go to SNF for [name] term rehabilitation to regain enough strength to at least transfer from a low bed/mattress to a chair in order to get to HD.  PT has already recommended SNF.  OT consult patient.  Medicaid patient.    Assessment & Plan:   Principal Problem:   Acute metabolic encephalopathy Active Problems:   Morbid obesity (HCC)   Essential hypertension   History of CVA with residual deficit   Anemia of chronic disease   Lymphedema   Debility   Acute pulmonary edema (HCC)   Acute renal failure superimposed on stage 5 chronic kidney disease, not on chronic dialysis (HCC)   Acute respiratory failure with hypoxia (HCC)   Right middle lobe pneumonia (HCC)   Leukocytosis   Uncontrolled diabetes mellitus with diabetic nephropathy, with long-term current use of insulin (HCC)   Dyslipidemia associated with type 2 diabetes  mellitus (HCC)   ESRD (end stage renal disease) (HCC)   Goals of care, counseling/discussion   Palliative care by specialist  ESRD with acute pulmonary edema  due to uncontrolled diabetes and FSG -  Patient was initially placed on aggressive IV Lasix and metolazone but progressed to ESRD - Tunneled dialysis cath Placed on 3/27. Hemodialysis initiated 3/28, then 3/29, HD 4/1, HD 4/2 - Accepted at [facility] [address]  - Vascular surgery also following, AV graft placement 4/3  Morbid obesity / Debility/ generalized weakness.  Unable to assist with transfers -  No home health agency will provide equipment and services to home because of lost equipment in the past -  SW consulted for SNF - Nutrition consulted - Body mass index is 54.58 kg/m. -  Wt has gone down about 70-lbs since admission  Acute respiratory failure with hypoxia with possible right middle lobe pneumonia vs. Atelectasis on CXR.  Persistent fevers, leukocytosis despite more than a week of doxycycline and the addition of ceftaz   -  Blood cultures 1/2 showed coagnegative staph likely contaminant  -  Repeat blood cultures NGTD  Acute metabolic encephalopathy - Improved, at baseline mental status - Likely multifactorial from history of CVA, end stage renal disease, possible uremia, acute infectious process - possible pneumonia  Chest pain episode  -  Troponin 1 was 0.04 however d-dimer was 2.01 - VQ scan Negative for any PE  Abnormal UA, culture negative.  Foley removed.  Anemia of chronic disease /  IDA - Due to CKD - Status post 2 units PRBC transfusion on 05/02/2016  - Aranesp 3/22, continue iron supplementation, B12  Uncontrolled diabetes mellitus with diabetic nephropathy with long term insulin use - Continue current insulin regimen: SSI - CBGs elevated, increase to 8 units Lantus at bedtime  Dyslipidemia associated with type 2 DM - Continue statin therapy   Essential hypertension, blood  pressures low normal - decrease Norvasc - d/c Imdur in addition to hydralazine and carvedilol which were already discontinued by nephrology  Bilateral inner thigh wounds - Related to moisture and friction from thighs rubbing together - Dressing procedure/placement/frequency: Cleanse with NS, gently pat dry, apply Xeroform gauze, top with dry gauze,change every shift. Pt is already on a low air loss mattress   DVT prophylaxis:  heparin Code Status:  full Family Communication:  Patient and her husband at bedside Disposition Plan:   Will need to go to SNF.  Family unable to transfer patient from low bed to chair so she will therefore be unable to go to dialysis from home.    Consultants:   Nephrology  WOC  Palliative care   Nutrition  Vascular surgery   Procedures:  Hemodialysis is initiated 3/28  Antimicrobials:  Anti-infectives    Start     Dose/Rate Route Frequency Ordered Stop   05/16/16 1630  cefTRIAXone (ROCEPHIN) 1 g in dextrose 5 % 50 mL IVPB  Status:  Discontinued     1 g 100 mL/hr over 30 Minutes Intravenous Every 24 hours 05/16/16 1625 05/17/16 1641   05/14/16 1430  cefTAZidime (FORTAZ) 1 g in dextrose 5 % 50 mL IVPB  Status:  Discontinued     1 g 100 mL/hr over 30 Minutes Intravenous Every 24 hours 05/14/16 1406 05/15/16 1325   05/14/16 1430  doxycycline (VIBRA-TABS) tablet 100 mg  Status:  Discontinued     100 mg Oral Every 12 hours 05/14/16 1406 05/15/16 1325   05/14/16 0000  ceFAZolin (ANCEF) IVPB 1 g/50 mL premix  Status:  Discontinued    Comments:  Send with pt to OR   1 g 100 mL/hr over 30 Minutes Intravenous On call 05/13/16 1630 05/15/16 1325   05/12/16 1330  cefTAZidime (FORTAZ) 1 g in dextrose 5 % 50 mL IVPB  Status:  Discontinued     1 g 100 mL/hr over 30 Minutes Intravenous Every 24 hours 05/12/16 1319 05/14/16 1406   05/08/16 1000  vancomycin (VANCOCIN) IVPB 1000 mg/200 mL premix     1,000 mg 200 mL/hr over 60 Minutes Intravenous To Surgery  05/07/16 1904 05/08/16 1004   05/08/16 0902  vancomycin (VANCOCIN) 1-5 GM/200ML-% IVPB    Comments:  [name], [name]   : cabinet override      05/08/16 0902 05/08/16 0904   05/07/16 1300  ceFAZolin (ANCEF) 3 g in dextrose 5 % 50 mL IVPB     3 g 130 mL/hr over 30 Minutes Intravenous To Radiology 05/07/16 1232 05/07/16 1330   05/02/16 2200  doxycycline (VIBRA-TABS) tablet 100 mg  Status:  Discontinued     100 mg Oral Every 12 hours 05/02/16 1732 05/14/16 1406   05/01/16 2200  piperacillin-tazobactam (ZOSYN) IVPB 2.25 g  Status:  Discontinued     2.25 g 100 mL/hr over 30 Minutes Intravenous Every 8 hours 05/01/16 1324 05/02/16 1729   05/01/16 1330  vancomycin (VANCOCIN) 2,500 mg in sodium chloride 0.9 % 500 mL IVPB     2,500 mg 250 mL/hr over 120 Minutes Intravenous  Once  05/01/16 1309 05/01/16 1551   05/01/16 1300  piperacillin-tazobactam (ZOSYN) IVPB 3.375 g     3.375 g 100 mL/hr over 30 Minutes Intravenous  Once 05/01/16 1258 05/01/16 1340   05/01/16 1300  vancomycin (VANCOCIN) IVPB 1000 mg/200 mL premix  Status:  Discontinued     1,000 mg 200 mL/hr over 60 Minutes Intravenous  Once 05/01/16 1258 05/01/16 1308       Subjective:  Tired today.  Frustrated because she just wants to go home to see her children.  Husband does not want her to be so far away that he cannot visit to check on her.  Both are distrustful of SNF based on past experiences.    Objective: Vitals:   05/17/16 1406 05/17/16 2204 05/18/16 0608 05/18/16 0951  BP: 124/61 (!) 117/57 122/74 109/66  Pulse: 93 97 (!) 105 (!) 107  Resp: (!) Temp: 97.5 F (36.4 C) 99.3 F (37.4 C) 99.2 F (37.3 C) (!) 100.5 F (38.1 C)  TempSrc: Oral Oral Oral Oral  SpO2: 100% 96% 100% 96%  Weight:   111.8 kg (246 lb 8 oz)   Height:        Intake/Output Summary (Last 24 hours) at 05/18/16 1506 Last data filed at 05/18/16 0800  Gross per 24 hour  Intake                0 ml  Output                0 ml  Net                 0 ml   Filed Weights   05/16/16 0459 05/17/16 0622 05/18/16 1610  Weight: 122 kg (268 lb 15.4 oz) 112 kg (246 lb 14.6 oz) 111.8 kg (246 lb 8 oz)    Examination:  General exam:  Adult female, obese.  Seen in her room today.  Sleepy but arousable.   HEENT:  NCAT, MMM Respiratory system: Clear to auscultation bilaterally Cardiovascular system: Regular rate and rhythm, normal S1/S2. No murmurs, rubs, gallops or clicks.  Warm extremities Gastrointestinal system: Normal active bowel sounds, soft, nondistended, nontender. MSK:  Normal tone and bulk, legs appear wrinkled as if previous swelling has improved.  1+ edema bilateral lower extremities Neuro:  Diffusely weak, decreased muscle tone and bulk    Data Reviewed: I have personally reviewed following labs and imaging studies  CBC:  Recent Labs Lab 05/13/16 0735 05/14/16 0712 05/14/16 2113 05/15/16 0713 05/17/16 0926  WBC 15.0* 15.7* 14.7* 15.0* 14.9*  HGB 8.5* 8.7* 8.3* 8.5* 7.9*  HCT 28.7* 28.9* 27.6* 26.4* 25.9*  MCV 90.8 90.9 89.6 90.7 88.7  PLT 251 254 225 245 257   Basic Metabolic Panel:  Recent Labs Lab 05/12/16 1611 05/14/16 0712 05/14/16 2113 05/17/16 0926  NA 141 137 137 135  K 5.5* 4.3 4.7 3.9  CL 102 98* 101 95*  CO2 GLUCOSE 224* 179* 171* 206*  BUN 54* 36* 45* 52*  CREATININE 4.11* 3.17* 3.41* 4.38*  CALCIUM 9.4 9.0 8.9 9.2  PHOS 3.5  --  3.5 4.1   GFR: Estimated Creatinine Clearance: 21.9 mL/min (A) (by C-G formula based on SCr of 4.38 mg/dL (H)). Liver Function Tests:  Recent Labs Lab 05/12/16 1611 05/14/16 2113 05/17/16 0926  ALBUMIN 2.1* 1.8* 2.0*   No results for input(s): LIPASE, AMYLASE in the last 168 hours. No results for input(s): AMMONIA in the last  168 hours. Coagulation Profile: No results for input(s): INR, PROTIME in the last 168 hours. Cardiac Enzymes: No results for input(s): CKTOTAL, CKMB, CKMBINDEX, TROPONINI in the last 168 hours. BNP (last 3  results) No results for input(s): PROBNP in the last 8760 hours. HbA1C: No results for input(s): HGBA1C in the last 72 hours. CBG:  Recent Labs Lab 05/17/16 1448 05/17/16 1643 05/17/16 2202 05/18/16 0756 05/18/16 1157  GLUCAP 163* 157* 152* 163* 177*   Lipid Profile: No results for input(s): CHOL, HDL, LDLCALC, TRIG, CHOLHDL, LDLDIRECT in the last 72 hours. Thyroid Function Tests: No results for input(s): TSH, T4TOTAL, FREET4, T3FREE, THYROIDAB in the last 72 hours. Anemia Panel: No results for input(s): VITAMINB12, FOLATE, FERRITIN, TIBC, IRON, RETICCTPCT in the last 72 hours. Urine analysis:    Component Value Date/Time   COLORURINE AMBER (A) 05/16/2016 1142   APPEARANCEUR TURBID (A) 05/16/2016 1142   LABSPEC 1.019 05/16/2016 1142   PHURINE 5.0 05/16/2016 1142   GLUCOSEU NEGATIVE 05/16/2016 1142   HGBUR MODERATE (A) 05/16/2016 1142   BILIRUBINUR NEGATIVE 05/16/2016 1142   KETONESUR NEGATIVE 05/16/2016 1142   PROTEINUR 100 (A) 05/16/2016 1142   UROBILINOGEN 0.2 10/26/2013 1721   NITRITE NEGATIVE 05/16/2016 1142   LEUKOCYTESUR MODERATE (A) 05/16/2016 1142   Sepsis Labs: (procalcitonin:4,lacticidven:4)  ) Recent Results (from the past 240 hour(s))  MRSA PCR Screening     Status: None   Collection Time: 05/13/16 11:26 AM  Result Value Ref Range Status   MRSA by PCR NEGATIVE NEGATIVE Final    Comment:        The GeneXpert MRSA Assay (FDA approved for NASAL specimens only), is one component of a comprehensive MRSA colonization surveillance program. It is not intended to diagnose MRSA infection nor to guide or monitor treatment for MRSA infections.   Culture, blood (Routine X 2) w Reflex to ID Panel     Status: None (Preliminary result)   Collection Time: 05/15/16  9:03 PM  Result Value Ref Range Status   Specimen Description BLOOD RIGHT HAND  Final   Special Requests   Final    BOTTLES DRAWN AEROBIC ONLY Blood Culture adequate volume   Culture NO  GROWTH 2 DAYS  Final   Report Status PENDING  Incomplete  Culture, blood (Routine X 2) w Reflex to ID Panel     Status: None (Preliminary result)   Collection Time: 05/15/16  9:10 PM  Result Value Ref Range Status   Specimen Description BLOOD RIGHT HAND  Final   Special Requests IN PEDIATRIC BOTTLE Blood Culture adequate volume  Final   Culture NO GROWTH 2 DAYS  Final   Report Status PENDING  Incomplete  Culture, Urine     Status: None   Collection Time: 05/16/16 11:42 AM  Result Value Ref Range Status   Specimen Description URINE, RANDOM  Final   Special Requests NONE  Final   Culture NO GROWTH  Final   Report Status 05/17/2016 FINAL  Final      Radiology Studies: Dg Chest Port 1 View  Result Date: 05/17/2016 CLINICAL DATA:  Dyspnea EXAM: PORTABLE CHEST 1 VIEW COMPARISON:  05/06/2016 FINDINGS: Right jugular central venous catheter in the mid SVC. Cardiac enlargement. Negative for edema or effusion. Lungs are clear. IMPRESSION: No active disease. Electronically Signed   By: Marlan Palau M.D.   On: 05/17/2016 08:53     Scheduled Meds: . albuterol  3 mL Inhalation Q M,W,F-HD  . amLODipine  10 mg Oral QHS  .  atorvastatin  20 mg Oral Daily  . calcium acetate  667 mg Oral TID WC  . darbepoetin (ARANESP) injection - DIALYSIS  150 mcg Intravenous Q Fri-HD  . feeding supplement (NEPRO CARB STEADY)  237 mL Oral TID BM  . ferric gluconate (FERRLECIT/NULECIT) IV  125 mg Intravenous Q M,W,F-HD  . gi cocktail  30 mL Oral Once  . heparin  5,000 Units Subcutaneous Q8H  . insulin aspart  0-5 Units Subcutaneous QHS  . insulin aspart  0-9 Units Subcutaneous TID WC  . insulin glargine  8 Units Subcutaneous QHS  . isosorbide mononitrate  30 mg Oral Daily  . multivitamin  1 tablet Oral QHS  . polyethylene glycol  17 g Oral Daily  . senna-docusate  1 tablet Oral BID   Continuous Infusions:   LOS: 17 days    Time spent: 30 min    Renae Fickle, MD [facility] Pager  [phone]  If 7PM-7AM, please contact night-coverage [email or web address] Password [email or web address] 05/18/2016, 3:06 PM

## 2016-05-19 ENCOUNTER — Inpatient Hospital Stay (HOSPITAL_COMMUNITY): Payer: Medicaid Other

## 2016-05-19 LAB — GLUCOSE, CAPILLARY
Glucose-Capillary: 200 mg/dL — ABNORMAL HIGH (ref 65–99)
Glucose-Capillary: 210 mg/dL — ABNORMAL HIGH (ref 65–99)
Glucose-Capillary: 222 mg/dL — ABNORMAL HIGH (ref 65–99)
Glucose-Capillary: 205 mg/dL — ABNORMAL HIGH (ref 65–99)

## 2016-05-19 LAB — CBC WITH DIFFERENTIAL/PLATELET
Basophils Absolute: 0 10*3/uL (ref 0.0–0.1)
Basophils Relative: 0 %
EOS ABS: 0.1 10*3/uL (ref 0.0–0.7)
Eosinophils Relative: 1 %
HCT: 27.9 % — ABNORMAL LOW (ref 36.0–46.0)
Hemoglobin: 8.8 g/dL — ABNORMAL LOW (ref 12.0–15.0)
Lymphocytes Relative: 14 %
Lymphs Abs: 1.9 10*3/uL (ref 0.7–4.0)
MCH: 27.7 pg (ref 26.0–34.0)
MCHC: 31.5 g/dL (ref 30.0–36.0)
MCV: 87.7 fL (ref 78.0–100.0)
MONO ABS: 1.8 10*3/uL — AB (ref 0.1–1.0)
Monocytes Relative: 13 %
NEUTROS PCT: 72 %
Neutro Abs: 10.1 10*3/uL — ABNORMAL HIGH (ref 1.7–7.7)
PLATELETS: 356 10*3/uL (ref 150–400)
RBC: 3.18 MIL/uL — ABNORMAL LOW (ref 3.87–5.11)
RDW: 15.1 % (ref 11.5–15.5)
WBC: 13.9 10*3/uL — AB (ref 4.0–10.5)

## 2016-05-19 LAB — LACTATE DEHYDROGENASE: LDH: 323 U/L — ABNORMAL HIGH (ref 98–192)

## 2016-05-19 LAB — CK: Total CK: 62 U/L (ref 38–234)

## 2016-05-19 LAB — SEDIMENTATION RATE: Sed Rate: 4 mm/hr (ref 0–22)

## 2016-05-19 MED ORDER — INSULIN GLARGINE 100 UNIT/ML ~~LOC~~ SOLN
10.0000 [IU] | Freq: Every day | SUBCUTANEOUS | Status: DC
  Administered 2016-05-19 – 2016-05-26 (×8): 10 [IU] via SUBCUTANEOUS
  Filled 2016-05-19 (×8): qty 0.1

## 2016-05-19 MED ORDER — IOPAMIDOL (ISOVUE-300) INJECTION 61%
INTRAVENOUS | Status: AC
  Filled 2016-05-19: qty 30

## 2016-05-19 NOTE — Progress Notes (Signed)
Bladder scan done ,no residual urine detected.

## 2016-05-19 NOTE — Progress Notes (Signed)
Subjective: Interval History: has no complaint.  Objective: Vital signs in last 24 hours: Temp:  [99.9 F (37.7 C)-101.3 F (38.5 C)] 101.3 F (38.5 C) (04/08 0936) Pulse Rate:  [99-107] 107 (04/08 0936) Resp:  [16-19] 18 (04/08 0936) BP: (105-135)/(65-87) 105/65 (04/08 0936) SpO2:  [94 %-97 %] 94 % (04/08 0936) Weight:  [111.8 kg (246 lb 8 oz)] 111.8 kg (246 lb 8 oz) (04/08 0509) Weight change: 0 kg (0 oz)  Intake/Output from previous day: No intake/output data recorded. Intake/Output this shift: No intake/output data recorded.  General appearance: cooperative, no distress, morbidly obese and slowed mentation Resp: diminished breath sounds bilaterally Chest wall: RIJ cath Cardio: S1, S2 normal and systolic murmur: holosystolic 2/6, blowing at apex GI: soft, massive, pos bs Extremities: edema 2+. also chronic changes LE and AVG LUA, leg ulcers on R  Lab Results:  Recent Labs  05/17/16 0926  WBC 14.9*  HGB 7.9*  HCT 25.9*  PLT 257   BMET:  Recent Labs  05/17/16 0926  NA 135  K 3.9  CL 95*  CO2 25  GLUCOSE 206*  BUN 52*  CREATININE 4.38*  CALCIUM 9.2   No results for input(s): PTH in the last 72 hours. Iron Studies: No results for input(s): IRON, TIBC, TRANSFERRIN, FERRITIN in the last 72 hours.  Studies/Results: No results found.  I have reviewed the patient's current medications.  Assessment/Plan: 1 ESRD HD MWF, in chair. 30 kg down but still edema. 2 HTN better stop amlod 3 anemia esa 4 HPTH vit d 5 Massive obesty 6 Immobility 7 Fevers undergoing eval 8 Anxiety P HD, esa, vit D, cultures, NHP    LOS: 18 days   Lizania Bouchard L 05/19/2016,10:40 AM

## 2016-05-19 NOTE — Progress Notes (Addendum)
PROGRESS NOTE  [name]  AYT:[id number] DOB: [birth year] DOA: 05/01/2016 PCP: Alvester Chou, NP  Brief Narrative:  41 y.o.femalewith medical history significant formorbid obesity, chronic kidney disease stage IV-V, diabetes mellitus, hypertension, prior CVA, diastolic CHF who presented to ED with progressive weakness, fever for past 1 week prior to this admission. She is actually bed bound for past year or so per her husband report ever since stroke. She also had dry cough and shortness of breath for past 1 week.  Her creatinine was 7.9 (much worse since her recent Cr values of 4-5).  She was started on hemodialysis and underwent left upper arm AV graft placement on 4/3.  She was anticipating going home with her family but because she is too weak to assist with transfers and is morbidly obese, she would need a hoyer lift, hospital bed, and wheelchair for transport to dialysis.  Unfortunately, she was given a hospital bed last year which was "lost" and no home health agencies in the region will provide her with equipment.  She was told today that she will need to go to SNF for [name] term rehabilitation to regain enough strength to at least transfer from a low bed/mattress to a chair in order to get to HD.  PT has already recommended SNF.  OT consult patient.  Medicaid patient.  She has been having intermittent fevers without defined source.  Her fevers appeared to get better with cessation of antibiotics, however, they have returned.    Assessment & Plan:   Principal Problem:   Acute metabolic encephalopathy Active Problems:   Morbid obesity ([place])   Essential hypertension   History of CVA with residual deficit   Anemia of chronic disease   Lymphedema   Debility   Acute pulmonary edema (HCC)   Acute renal failure superimposed on stage 5 chronic kidney disease, not on chronic dialysis ([place])   Acute respiratory failure with hypoxia ([place])   Right middle lobe pneumonia ([place])  Leukocytosis   Uncontrolled diabetes mellitus with diabetic nephropathy, with long-term current use of insulin (HCC)   Dyslipidemia associated with type 2 diabetes mellitus ([place])   ESRD (end stage renal disease) ([place])   Goals of care, counseling/discussion   Palliative care by specialist  ESRD with acute pulmonary edema  due to uncontrolled diabetes and FSG -  Patient was initially placed on aggressive IV Lasix and metolazone but progressed to ESRD - Tunneled dialysis cath Placed on 3/27. Hemodialysis initiated 3/28, then 3/29, HD 4/1, HD 4/2 - Accepted at [facility]  - Vascular surgery also following, AV graft placement 4/3  FUO.  She had coag negative staph from 1/2 blood cultures on 3/21 which was felt to be contaminant.  Other cultures are so far negative.  -  CT scan of chest was reviewed, from 3/22:  No evidence of abscess or source of fever -  Recent CXR and urine culture have been negative -  Repeat blood culture -  CT a/p to eval for abscess -  HIV negative during this hospitalization -  ESR -  LDH -  ANA -  CK - RF -  Quantiferon -  SPEP -  Wound care RN to reassess > may need general surgery consult for degree of debridement needed  Morbid obesity / Debility/ generalized weakness.  Unable to assist with transfers -  No home health agency will provide equipment and services to home because of lost equipment in the past -  SW  consulted for SNF -  Nutrition consulted -  Body mass index is 54.58 kg/m. -  Wt has gone down about 70-lbs since admission  Acute respiratory failure with hypoxia with possible right middle lobe pneumonia vs. Atelectasis on CXR.  Persistent fevers, leukocytosis despite more than a week of doxycycline and the addition of ceftaz    Acute metabolic encephalopathy - Improved, at baseline mental status - Likely multifactorial from history of CVA, end stage renal disease, possible uremia, acute infectious process - possible  pneumonia  Chest pain episode  -  Troponin 1 was 0.04 however d-dimer was 2.01 - VQ scan Negative for any PE  Anemia of chronic disease / IDA - Status post 2 units PRBC transfusion on 05/02/2016  - Aranesp 3/22, continue iron supplementation, B12  Uncontrolled diabetes mellitus with diabetic nephropathy with long term insulin use - Continue current insulin regimen: SSI - CBGs elevated, increase to 10 units Lantus at bedtime  Dyslipidemia associated with type 2 DM - Continue statin therapy   Essential hypertension, blood pressures low normal - d/c Norvasc  Bilateral inner thigh wounds, left leg has foul odor and area of central gangrene.  Slough surrounding central area.   - Related to moisture and friction from thighs rubbing together - Dressing procedure/placement/frequency: Cleanse with NS, gently pat dry, apply Xeroform gauze, top with dry gauze,change every shift. Pt is already on a low air loss mattress - Left posterior thigh has area of central necrosis and foul odor, but surrounding tissues appear to be healing, no erythema or induration. -  Wound care consult > may need general surgery consultation   DVT prophylaxis:  heparin Code Status:  full Family Communication:  Patient alone Disposition Plan:   Will need to go to SNF.  Family unable to transfer patient from low bed to chair so she will therefore be unable to go to dialysis from home.    Consultants:   Nephrology  WOC  Palliative care   Nutrition  Vascular surgery   Procedures:  Hemodialysis is initiated 3/28  Antimicrobials:  Anti-infectives    Start     Dose/Rate Route Frequency Ordered Stop   05/16/16 1630  cefTRIAXone (ROCEPHIN) 1 g in dextrose 5 % 50 mL IVPB  Status:  Discontinued     1 g 100 mL/hr over 30 Minutes Intravenous Every 24 hours 05/16/16 1625 05/17/16 1641   05/14/16 1430  cefTAZidime (FORTAZ) 1 g in dextrose 5 % 50 mL IVPB  Status:  Discontinued     1 g 100 mL/hr over 30  Minutes Intravenous Every 24 hours 05/14/16 1406 05/15/16 1325   05/14/16 1430  doxycycline (VIBRA-TABS) tablet 100 mg  Status:  Discontinued     100 mg Oral Every 12 hours 05/14/16 1406 05/15/16 1325   05/14/16 0000  ceFAZolin (ANCEF) IVPB 1 g/50 mL premix  Status:  Discontinued    Comments:  Send with pt to OR   1 g 100 mL/hr over 30 Minutes Intravenous On call 05/13/16 1630 05/15/16 1325   05/12/16 1330  cefTAZidime (FORTAZ) 1 g in dextrose 5 % 50 mL IVPB  Status:  Discontinued     1 g 100 mL/hr over 30 Minutes Intravenous Every 24 hours 05/12/16 1319 05/14/16 1406   05/08/16 1000  vancomycin (VANCOCIN) IVPB 1000 mg/200 mL premix     1,000 mg 200 mL/hr over 60 Minutes Intravenous To Surgery 05/07/16 1904 05/08/16 1004   05/08/16 0902  vancomycin (VANCOCIN) 1-5 GM/200ML-% IVPB  CommentsVernell Leep, [name]   : cabinet override      05/08/16 0902 05/08/16 0904   05/07/16 1300  ceFAZolin (ANCEF) 3 g in dextrose 5 % 50 mL IVPB     3 g 130 mL/hr over 30 Minutes Intravenous To Radiology 05/07/16 1232 05/07/16 1330   05/02/16 2200  doxycycline (VIBRA-TABS) tablet 100 mg  Status:  Discontinued     100 mg Oral Every 12 hours 05/02/16 1732 05/14/16 1406   05/01/16 2200  piperacillin-tazobactam (ZOSYN) IVPB 2.25 g  Status:  Discontinued     2.25 g 100 mL/hr over 30 Minutes Intravenous Every 8 hours 05/01/16 1324 05/02/16 1729   05/01/16 1330  vancomycin (VANCOCIN) 2,500 mg in sodium chloride 0.9 % 500 mL IVPB     2,500 mg 250 mL/hr over 120 Minutes Intravenous  Once 05/01/16 1309 05/01/16 1551   05/01/16 1300  piperacillin-tazobactam (ZOSYN) IVPB 3.375 g     3.375 g 100 mL/hr over 30 Minutes Intravenous  Once 05/01/16 1258 05/01/16 1340   05/01/16 1300  vancomycin (VANCOCIN) IVPB 1000 mg/200 mL premix  Status:  Discontinued     1,000 mg 200 mL/hr over 60 Minutes Intravenous  Once 05/01/16 1258 05/01/16 1308       Subjective:  Tired.  Denies SOB, cough, vomiting.  Poor appetite.     Objective: Vitals:   05/18/16 1713 05/18/16 2130 05/19/16 0509 05/19/16 0936  BP: 125/87 135/84 131/74 105/65  Pulse: (!) 104 (!) 102 99 (!) 107  Resp: _0 Temp: 99.9 F (37.7 C) 100.2 F (37.9 C) 99.9 F (37.7 C) (!) 101.3 F (38.5 C)  TempSrc: Oral Oral  Oral  SpO2: 96% 97% 95% 94%  Weight:   111.8 kg (246 lb 8 oz)   Height:        Intake/Output Summary (Last 24 hours) at 05/19/16 1427 Last data filed at 05/19/16 1056  Gross per 24 hour  Intake                0 ml  Output                0 ml  Net                0 ml   Filed Weights   05/17/16 0622 05/18/16 0608 05/19/16 0509  Weight: 112 kg (246 lb 14.6 oz) 111.8 kg (246 lb 8 oz) 111.8 kg (246 lb 8 oz)    Examination:  General exam:  Adult female, obese.  Somewhat sleepy, hanging over the edge of the bed HEENT:  NCAT, MMM Respiratory system: Clear to auscultation bilaterally Cardiovascular system: Regular rate and rhythm, normal S1/S2. No murmurs, rubs, gallops or clicks.  Warm extremities Gastrointestinal system: Normal active bowel sounds, soft, nondistended, nontender. MSK:  Normal tone and bulk, legs appear wrinkled as if previous swelling has improved.  1+ edema bilateral lower extremities Neuro:  Diffusely weak, decreased muscle tone and bulk Skin:  Right posterior thigh ulcerations and skin tears appear to have healing borders, no surrounding induration or erythema.  Left leg has multiple skin tears and ulcerations and has a foul odor and has either gangrenous skin or significant overlying eschar which may need to be debrided.  Superior area of ulcer has some slough.      Data Reviewed: I have personally reviewed following labs and imaging studies  CBC:  Recent Labs Lab 05/13/16 0735 05/14/16 0712 05/14/16 2113 05/15/16 0713 05/17/16 0926  WBC  15.0* 15.7* 14.7* 15.0* 14.9*  HGB 8.5* 8.7* 8.3* 8.5* 7.9*  HCT 28.7* 28.9* 27.6* 26.4* 25.9*  MCV 90.8 90.9 89.6 90.7 88.7  PLT 251 254 225 245  989   Basic Metabolic Panel:  Recent Labs Lab 05/12/16 1611 05/14/16 0712 05/14/16 2113 05/17/16 0926  NA 141 137 137 135  K 5.5* 4.3 4.7 3.9  CL 102 98* 101 95*  CO2 _0 GLUCOSE 224* 179* 171* 206*  BUN 54* 36* 45* 52*  CREATININE 4.11* 3.17* 3.41* 4.38*  CALCIUM 9.4 9.0 8.9 9.2  PHOS 3.5  --  3.5 4.1   GFR: Estimated Creatinine Clearance: 21.9 mL/min (A) (by C-G formula based on SCr of 4.38 mg/dL (H)). Liver Function Tests:  Recent Labs Lab 05/12/16 1611 05/14/16 2113 05/17/16 0926  ALBUMIN 2.1* 1.8* 2.0*   No results for input(s): LIPASE, AMYLASE in the last 168 hours. No results for input(s): AMMONIA in the last 168 hours. Coagulation Profile: No results for input(s): INR, PROTIME in the last 168 hours. Cardiac Enzymes: No results for input(s): CKTOTAL, CKMB, CKMBINDEX, TROPONINI in the last 168 hours. BNP (last 3 results) No results for input(s): PROBNP in the last 8760 hours. HbA1C: No results for input(s): HGBA1C in the last 72 hours. CBG:  Recent Labs Lab 05/18/16 1157 05/18/16 1710 05/18/16 2122 05/19/16 0819 05/19/16 1208  GLUCAP 177* 184* 194* 200* 210*   Lipid Profile: No results for input(s): CHOL, HDL, LDLCALC, TRIG, CHOLHDL, LDLDIRECT in the last 72 hours. Thyroid Function Tests: No results for input(s): TSH, T4TOTAL, FREET4, T3FREE, THYROIDAB in the last 72 hours. Anemia Panel: No results for input(s): VITAMINB12, FOLATE, FERRITIN, TIBC, IRON, RETICCTPCT in the last 72 hours. Urine analysis:    Component Value Date/Time   COLORURINE AMBER (A) 05/16/2016 1142   APPEARANCEUR TURBID (A) 05/16/2016 1142   LABSPEC 1.019 05/16/2016 1142   PHURINE 5.0 05/16/2016 1142   GLUCOSEU NEGATIVE 05/16/2016 1142   HGBUR MODERATE (A) 05/16/2016 1142   BILIRUBINUR NEGATIVE 05/16/2016 1142   KETONESUR NEGATIVE 05/16/2016 1142   PROTEINUR 100 (A) 05/16/2016 1142   UROBILINOGEN 0.2 10/26/2013 1721   NITRITE NEGATIVE 05/16/2016 1142    LEUKOCYTESUR MODERATE (A) 05/16/2016 1142   Sepsis Labs: _1 (procalcitonin:4,lacticidven:4)  ) Recent Results (from the past 240 hour(s))  MRSA PCR Screening     Status: None   Collection Time: 05/13/16 11:26 AM  Result Value Ref Range Status   MRSA by PCR NEGATIVE NEGATIVE Final    Comment:        The GeneXpert MRSA Assay (FDA approved for NASAL specimens only), is one component of a comprehensive MRSA colonization surveillance program. It is not intended to diagnose MRSA infection nor to guide or monitor treatment for MRSA infections.   Culture, blood (Routine X 2) w Reflex to ID Panel     Status: None (Preliminary result)   Collection Time: 05/15/16  9:03 PM  Result Value Ref Range Status   Specimen Description BLOOD RIGHT HAND  Final   Special Requests   Final    BOTTLES DRAWN AEROBIC ONLY Blood Culture adequate volume   Culture NO GROWTH 3 DAYS  Final   Report Status PENDING  Incomplete  Culture, blood (Routine X 2) w Reflex to ID Panel     Status: None (Preliminary result)   Collection Time: 05/15/16  9:10 PM  Result Value Ref Range Status   Specimen Description BLOOD RIGHT HAND  Final   Special Requests IN PEDIATRIC BOTTLE  Blood Culture adequate volume  Final   Culture NO GROWTH 3 DAYS  Final   Report Status PENDING  Incomplete  Culture, Urine     Status: None   Collection Time: 05/16/16 11:42 AM  Result Value Ref Range Status   Specimen Description URINE, RANDOM  Final   Special Requests NONE  Final   Culture NO GROWTH  Final   Report Status 05/17/2016 FINAL  Final      Radiology Studies: No results found.   Scheduled Meds: . albuterol  3 mL Inhalation Q M,W,F-HD  . atorvastatin  20 mg Oral Daily  . calcium acetate  667 mg Oral TID WC  . darbepoetin (ARANESP) injection - DIALYSIS  150 mcg Intravenous Q Fri-HD  . feeding supplement (NEPRO CARB STEADY)  237 mL Oral TID BM  . ferric gluconate (FERRLECIT/NULECIT) IV  125 mg Intravenous Q M,W,F-HD   . gi cocktail  30 mL Oral Once  . heparin  5,000 Units Subcutaneous Q8H  . insulin aspart  0-5 Units Subcutaneous QHS  . insulin aspart  0-9 Units Subcutaneous TID WC  . insulin glargine  8 Units Subcutaneous QHS  . iopamidol      . multivitamin  1 tablet Oral QHS  . polyethylene glycol  17 g Oral Daily  . senna-docusate  1 tablet Oral BID   Continuous Infusions:   LOS: 18 days    Time spent: 30 min    Janece Canterbury, MD [facility] Pager [phone]  If 7PM-7AM, please contact night-coverage [email or web address] Password [facility] 05/19/2016, 2:27 PM

## 2016-05-20 DIAGNOSIS — L089 Local infection of the skin and subcutaneous tissue, unspecified: Secondary | ICD-10-CM

## 2016-05-20 DIAGNOSIS — L98499 Non-pressure chronic ulcer of skin of other sites with unspecified severity: Secondary | ICD-10-CM

## 2016-05-20 LAB — CBC
HCT: 27.9 % — ABNORMAL LOW (ref 36.0–46.0)
HEMOGLOBIN: 8.7 g/dL — AB (ref 12.0–15.0)
MCH: 27.5 pg (ref 26.0–34.0)
MCHC: 31.2 g/dL (ref 30.0–36.0)
MCV: 88.3 fL (ref 78.0–100.0)
Platelets: 349 10*3/uL (ref 150–400)
RBC: 3.16 MIL/uL — AB (ref 3.87–5.11)
RDW: 15.6 % — ABNORMAL HIGH (ref 11.5–15.5)
WBC: 13.5 10*3/uL — AB (ref 4.0–10.5)

## 2016-05-20 LAB — RENAL FUNCTION PANEL
ALBUMIN: 2 g/dL — AB (ref 3.5–5.0)
Anion gap: 17 — ABNORMAL HIGH (ref 5–15)
BUN: 68 mg/dL — ABNORMAL HIGH (ref 6–20)
CALCIUM: 9.1 mg/dL (ref 8.9–10.3)
CO2: 23 mmol/L (ref 22–32)
Chloride: 93 mmol/L — ABNORMAL LOW (ref 101–111)
Creatinine, Ser: 5.63 mg/dL — ABNORMAL HIGH (ref 0.44–1.00)
GFR, EST AFRICAN AMERICAN: 10 mL/min — AB (ref >60)
GFR, EST NON AFRICAN AMERICAN: 9 mL/min — AB (ref >60)
GLUCOSE: 185 mg/dL — AB (ref 65–99)
PHOSPHORUS: 4.9 mg/dL — AB (ref 2.5–4.6)
Potassium: 4.6 mmol/L (ref 3.5–5.1)
Sodium: 133 mmol/L — ABNORMAL LOW (ref 135–145)

## 2016-05-20 LAB — GLUCOSE, CAPILLARY
GLUCOSE-CAPILLARY: 171 mg/dL — AB (ref 65–99)
GLUCOSE-CAPILLARY: 152 mg/dL — AB (ref 65–99)
Glucose-Capillary: 164 mg/dL — ABNORMAL HIGH (ref 65–99)

## 2016-05-20 LAB — RHEUMATOID FACTOR: RHEUMATOID FACTOR: 26.1 [IU]/mL — AB (ref 0.0–13.9)

## 2016-05-20 MED ORDER — ALTEPLASE 2 MG IJ SOLR: 2.0000 mg | Freq: Once | INTRAMUSCULAR | Status: DC | PRN

## 2016-05-20 MED ORDER — LIDOCAINE-PRILOCAINE 2.5-2.5 % EX CREA: 1.0000 "application " | TOPICAL_CREAM | CUTANEOUS | Status: DC | PRN

## 2016-05-20 MED ORDER — SODIUM CHLORIDE 0.9 % IV SOLN
2250.0000 mg | INTRAVENOUS | Status: AC
  Administered 2016-05-20: 2250 mg via INTRAVENOUS
  Filled 2016-05-20: qty 2250

## 2016-05-20 MED ORDER — VANCOMYCIN HCL IN DEXTROSE 1-5 GM/200ML-% IV SOLN
1000.0000 mg | INTRAVENOUS | Status: DC
  Administered 2016-05-22 – 2016-05-24 (×2): 1000 mg via INTRAVENOUS
  Filled 2016-05-20 (×2): qty 200

## 2016-05-20 MED ORDER — INSULIN ASPART 100 UNIT/ML ~~LOC~~ SOLN
3.0000 [IU] | Freq: Three times a day (TID) | SUBCUTANEOUS | Status: DC
  Administered 2016-05-20 – 2016-05-23 (×3): 3 [IU] via SUBCUTANEOUS

## 2016-05-20 MED ORDER — SODIUM CHLORIDE 0.9 % IV SOLN: 100.0000 mL | INTRAVENOUS | Status: DC | PRN

## 2016-05-20 MED ORDER — SODIUM CHLORIDE 0.9 % IV SOLN: 1250.0000 mg | INTRAVENOUS | Status: DC

## 2016-05-20 MED ORDER — PIPERACILLIN-TAZOBACTAM 3.375 G IVPB
3.3750 g | Freq: Two times a day (BID) | INTRAVENOUS | Status: DC
  Administered 2016-05-20 – 2016-05-27 (×14): 3.375 g via INTRAVENOUS
  Filled 2016-05-20 (×16): qty 50

## 2016-05-20 MED ORDER — PENTAFLUOROPROP-TETRAFLUOROETH EX AERO: 1.0000 "application " | INHALATION_SPRAY | CUTANEOUS | Status: DC | PRN

## 2016-05-20 MED ORDER — HEPARIN SODIUM (PORCINE) 1000 UNIT/ML DIALYSIS
40.0000 [IU]/kg | Freq: Once | INTRAMUSCULAR | Status: AC
  Administered 2016-05-20: 4500 [IU] via INTRAVENOUS_CENTRAL

## 2016-05-20 MED ORDER — HEPARIN SODIUM (PORCINE) 1000 UNIT/ML DIALYSIS: 1000.0000 [IU] | INTRAMUSCULAR | Status: DC | PRN

## 2016-05-20 MED ORDER — LIDOCAINE HCL (PF) 1 % IJ SOLN: 5.0000 mL | INTRAMUSCULAR | Status: DC | PRN

## 2016-05-20 NOTE — Progress Notes (Signed)
qPhysical Therapy Treatment Patient Details Name: [name] MRN: [id number] DOB: [birth year] Today's Date: 05/20/2016    History of Present Illness 41 y.o. female with medical history significant of morbid obesity, chronic kidney disease stage IV-V, diabetes mellitus, hypertension, prior CVA, diastolic CHF, presents to the emergency room with chief complaint of weakness as well as fever and diagnosed with acute hypoxic resp failure with acute encephalopathy. S/p L AV graft on 05/14/16.     PT Comments    Pt continues to present with low motivation. Pt requiring max encouragement to participate in BLE exercise this session. Education to family about increasing participation to perform seated BLE exercise while pt sitting up in chair.  Current recommendations appropriate. Will continue to follow.   Follow Up Recommendations  SNF;Supervision/Assistance - 24 hour     Equipment Recommendations  Hospital bed;Wheelchair (measurements PT);Wheelchair cushion (measurements PT);Other (comment)    Recommendations for Other Services       Precautions / Restrictions Precautions Precautions: Fall Precaution Comments: bil inner thigh wounds Restrictions Weight Bearing Restrictions: No    Mobility  Bed Mobility Overal bed mobility: Needs Assistance Bed Mobility: Rolling Rolling: Max assist;+2 for physical assistance         General bed mobility comments: Max A +2 for rolling for pad placement. Continues to exhibit low motivation to participate. Verbal and manual cues for appropriate UE placment and LE placement during bed mobility.   Transfers Overall transfer level: Needs assistance               General transfer comment: [facility] used to transfer pt from bed to recliner for HD. Education to pt family about why we use the lift and why pt cannot transfer through standing and walking.   Ambulation/Gait                 Stairs            Wheelchair Mobility     Modified Rankin (Stroke Patients Only)       Balance                                            Cognition Arousal/Alertness: Awake/alert Behavior During Therapy: Flat affect Overall Cognitive Status: Within Functional Limits for tasks assessed                                 General Comments: Pt continues to exhibit low motivation and participation to participate with PT.       Exercises General Exercises - Lower Extremity Long Arc Quad: AROM;Both;5 reps;Seated    General Comments General comments (skin integrity, edema, etc.): Pt husband present through parts of session. Pt with low motivation to participate and required max encouragement throughout session. Education to pt's husband about getting pt to perform LE exercise while sitting in chair.       Pertinent Vitals/Pain Pain Assessment: 0-10 Pain Score: 10-Worst pain ever Pain Location: BLE Pain Descriptors / Indicators: Sore;Grimacing Pain Intervention(s): Limited activity within patient's tolerance;Monitored during session;Repositioned    Home Living                      Prior Function            PT Goals (current goals can now be found in the care plan  section) Acute Rehab PT Goals Patient Stated Goal: did not state during OT eval PT Goal Formulation: With patient Time For Goal Achievement: 05/22/16 Potential to Achieve Goals: Fair Progress towards PT goals: Not progressing toward goals - comment (low motivation )    Frequency    Min 2X/week      PT Plan Frequency needs to be updated    Co-evaluation             End of Session   Activity Tolerance: Other (comment) (low motivation ) Patient left: in chair;with call bell/phone within reach;with family/visitor present Nurse Communication: Mobility status PT Visit Diagnosis: Muscle weakness (generalized) (M62.81);Other abnormalities of gait and mobility (R26.89);Pain Pain - Right/Left:  (Bilateral  ) Pain - part of body: Leg     Time: 1610-9604 PT Time Calculation (min) (ACUTE ONLY): 30 min  Charges:  $Therapeutic Activity: 23-37 mins                    G Codes:       [name], PT, DPT  Acute Rehabilitation Services  Pager: [phone]    [name] 05/20/2016, 11:18 AM

## 2016-05-20 NOTE — Consult Note (Signed)
WOC Nurse wound consult note Reason for Consult: reevaluate inner thigh wounds Wound type: MASD (moisture associated skin damage) with evolution of full thickness skin ulcerations  Measurement: Left thigh 10cm x 9cm x 0.1cm Right thigh 4cm x 3.5cm x 0.1cm  Wound bed: Left thigh 80% black, soft/20% yellow at wound perimeter Right thigh; 90% pink, moist, 10% yellow/brown crust Drainage (amount, consistency, odor) scant, slight odor Periwound: brawny edema of the right lower leg, induration note in the left thigh circumferentially  Dressing procedure/placement/frequency: Continue non adherent, xeroform and ABD until surgery can evaluate left thigh wound. Notified bedside nurse that current dressing was vaseline gauze, not xeroform, she will switch after surgical evaluation.  Discussed with Dr. Malachi Bonds, offered that surgical debridement may be needed for the left inner thigh and that if surgery deemed her a high risk patient we could certainly use enzymatic debridement with hydrotherapy.  WOC Nurse team will follow along with you for weekly wound assessments.  Please notify me of any acute changes in the wounds or any new areas of concerns Kilie Rund [facility] MSN, RN,CWOCN, CNS [phone]

## 2016-05-20 NOTE — Procedures (Signed)
Wants to go "home" Goal 4500, BP ok, stable hemodynamics; AV access lUE with thrill placesd on 05/14/16 Thigh wounds are malodorous. Mirabel Ahlgren C

## 2016-05-20 NOTE — Progress Notes (Signed)
Attempted to get consent signed, however patient is disoriented and lethargic after HD. Mobile phone listed for spouse is inaccurate and there was no answer at home phone. Will continue to monitor patient and get consent when appropriate.

## 2016-05-20 NOTE — Progress Notes (Signed)
PROGRESS NOTE  [name]  UDJ:[id number] DOB: 11/28/1975 DOA: 05/01/2016 PCP: Alvester Chou, NP  Brief Narrative:  41 y.o.femalewith medical history significant formorbid obesity, chronic kidney disease stage IV-V, diabetes mellitus, hypertension, prior CVA, diastolic CHF who presented to ED with progressive weakness, fever for past 1 week prior to this admission. She is actually bed bound for past year or so per her husband report ever since stroke. She also had dry cough and shortness of breath for past 1 week.  Her creatinine was 7.9 (much worse since her recent Cr values of 4-5).  She was started on hemodialysis and underwent left upper arm AV graft placement on 4/3.  She was anticipating going home with her family but because she is too weak to assist with transfers and is morbidly obese, she would need a hoyer lift, hospital bed, and wheelchair for transport to dialysis.  Unfortunately, she was given a hospital bed last year which was "lost" and no home health agencies in the region will provide her with equipment.  She was told today that she will need to go to SNF for [facility] term rehabilitation to regain enough strength to at least transfer from a low bed/mattress to a chair in order to get to HD.  PT has already recommended SNF.  OT consult patient.  Medicaid patient.  She has been having intermittent fevers without defined source.  Her fevers appeared to get better with cessation of antibiotics, however, they have returned.  I suspect they may be due to a gangrenous area on the posterior left thigh.  General surgery has been consulted and antibiotics resumed.  Will likely need OR debridement.    Assessment & Plan:   Principal Problem:   Acute metabolic encephalopathy Active Problems:   Morbid obesity ([place])   Essential hypertension   History of CVA with residual deficit   Anemia of chronic disease   Lymphedema   Debility   Acute pulmonary edema (HCC)   Acute renal failure  superimposed on stage 5 chronic kidney disease, not on chronic dialysis ([place])   Acute respiratory failure with hypoxia ([place])   Right middle lobe pneumonia ([place])   Leukocytosis   Uncontrolled diabetes mellitus with diabetic nephropathy, with long-term current use of insulin (HCC)   Dyslipidemia associated with type 2 diabetes mellitus ([place])   ESRD (end stage renal disease) ([place])   Goals of care, counseling/discussion   Palliative care by specialist  ESRD with acute pulmonary edema  due to uncontrolled diabetes and FSG -  Patient was initially placed on aggressive IV Lasix and metolazone but progressed to ESRD - Tunneled dialysis cath Placed on 3/27. Hemodialysis initiated 3/28, then 3/29, HD 4/1, HD 4/2 - Accepted at [facility]  - Vascular surgery also following, AV graft placement 4/3  Recurrent fever likely due to cellulitis, possible abscess of left posterior leg ulcer.  She had coag negative staph from 1/2 blood cultures on 3/21 which was felt to be contaminant.  Other cultures are so far negative.  -  CT scan of chest was reviewed, from 3/22:  No evidence of abscess or source of fever -  Recent CXR and urine culture have been negative -  Repeat blood culture:  NGTD -  CT a/p to eval for abscess:  No obvious source of fever -  HIV negative during this hospitalization -  ESR 4 -  LDH 323 -  ANA pending -  CK 62 - RF pending -  Quantiferon pending -  SPEP pending -  Appreciate wound care assistance -  General surgery consulted > plan for OR debridement -  Resume vancomycin and zosyn pending debridement  Morbid obesity / Debility/ generalized weakness.  Unable to assist with transfers -  No home health agency will provide equipment and services to home because of lost equipment in the past -  SW consulted for SNF -  Nutrition consulted -  Body mass index is 54.58 kg/m. -  Wt has gone down about 70-lbs since admission  Acute respiratory failure with  hypoxia with possible right middle lobe pneumonia vs. Atelectasis on CXR.  Persistent fevers, leukocytosis despite more than a week of doxycycline and the addition of ceftaz    Acute metabolic encephalopathy - Improved, at baseline mental status - Likely multifactorial from history of CVA, end stage renal disease, possible uremia, acute infectious process - possible pneumonia  Chest pain episode  -  Troponin 1 was 0.04 however d-dimer was 2.01 - VQ scan Negative for any PE  Anemia of chronic disease / IDA - Status post 2 units PRBC transfusion on 05/02/2016  - Aranesp 3/22, continue iron supplementation, B12  Uncontrolled diabetes mellitus with diabetic nephropathy with long term insulin use, hyperglycemic - continue lantus 10 units - start aspart 3 units TIC AC -  Continue low dose SSI  Dyslipidemia associated with type 2 DM - Continue statin therapy   Essential hypertension, blood pressures low normal despite holding BP medications  Bilateral inner thigh wounds, left leg has foul odor and area of central gangrene.  Slough surrounding central area.    - Related to moisture and friction from thighs rubbing together - Dressing procedure/placement/frequency: Cleanse with NS, gently pat dry, apply Xeroform gauze, top with dry gauze,change every shift. Pt is already on a low air loss mattress -  Appreciate general surgery assistance  DVT prophylaxis:  heparin Code Status:  full Family Communication:  Patient alone Disposition Plan:   Will need to go to SNF.  Family unable to transfer patient from low bed to chair so she will therefore be unable to go to dialysis from home.    Consultants:   Nephrology  WOC  Palliative care   Nutrition  Vascular surgery   Procedures:  Hemodialysis is initiated 3/28  Antimicrobials:  Anti-infectives    Start     Dose/Rate Route Frequency Ordered Stop   05/22/16 1200  vancomycin (VANCOCIN) 1,250 mg in sodium chloride 0.9 % 250 mL  IVPB  Status:  Discontinued     1,250 mg 250 mL/hr over 60 Minutes Intravenous Every M-W-F (Hemodialysis) 05/20/16 1024 05/20/16 1027   05/22/16 1200  vancomycin (VANCOCIN) IVPB 1000 mg/200 mL premix     1,000 mg 200 mL/hr over 60 Minutes Intravenous Every M-W-F (Hemodialysis) 05/20/16 1027     05/20/16 1200  vancomycin (VANCOCIN) 2,250 mg in sodium chloride 0.9 % 250 mL IVPB     2,250 mg 250 mL/hr over 60 Minutes Intravenous Every Mon (Hemodialysis) 05/20/16 1024 05/27/16 1159   05/20/16 1030  piperacillin-tazobactam (ZOSYN) IVPB 3.375 g     3.375 g 12.5 mL/hr over 240 Minutes Intravenous Every 12 hours 05/20/16 1012     05/16/16 1630  cefTRIAXone (ROCEPHIN) 1 g in dextrose 5 % 50 mL IVPB  Status:  Discontinued     1 g 100 mL/hr over 30 Minutes Intravenous Every 24 hours 05/16/16 1625 05/17/16 1641   05/14/16 1430  cefTAZidime (FORTAZ) 1 g in dextrose 5 %  50 mL IVPB  Status:  Discontinued     1 g 100 mL/hr over 30 Minutes Intravenous Every 24 hours 05/14/16 1406 05/15/16 1325   05/14/16 1430  doxycycline (VIBRA-TABS) tablet 100 mg  Status:  Discontinued     100 mg Oral Every 12 hours 05/14/16 1406 05/15/16 1325   05/14/16 0000  ceFAZolin (ANCEF) IVPB 1 g/50 mL premix  Status:  Discontinued    Comments:  Send with pt to OR   1 g 100 mL/hr over 30 Minutes Intravenous On call 05/13/16 1630 05/15/16 1325   05/12/16 1330  cefTAZidime (FORTAZ) 1 g in dextrose 5 % 50 mL IVPB  Status:  Discontinued     1 g 100 mL/hr over 30 Minutes Intravenous Every 24 hours 05/12/16 1319 05/14/16 1406   05/08/16 1000  vancomycin (VANCOCIN) IVPB 1000 mg/200 mL premix     1,000 mg 200 mL/hr over 60 Minutes Intravenous To Surgery 05/07/16 1904 05/08/16 1004   05/08/16 0902  vancomycin (VANCOCIN) 1-5 GM/200ML-% IVPB    Comments:  Vernell Leep, [place]   : cabinet override      05/08/16 0902 05/08/16 0904   05/07/16 1300  ceFAZolin (ANCEF) 3 g in dextrose 5 % 50 mL IVPB     3 g 130 mL/hr over 30 Minutes  Intravenous To Radiology 05/07/16 1232 05/07/16 1330   05/02/16 2200  doxycycline (VIBRA-TABS) tablet 100 mg  Status:  Discontinued     100 mg Oral Every 12 hours 05/02/16 1732 05/14/16 1406   05/01/16 2200  piperacillin-tazobactam (ZOSYN) IVPB 2.25 g  Status:  Discontinued     2.25 g 100 mL/hr over 30 Minutes Intravenous Every 8 hours 05/01/16 1324 05/02/16 1729   05/01/16 1330  vancomycin (VANCOCIN) 2,500 mg in sodium chloride 0.9 % 500 mL IVPB     2,500 mg 250 mL/hr over 120 Minutes Intravenous  Once 05/01/16 1309 05/01/16 1551   05/01/16 1300  piperacillin-tazobactam (ZOSYN) IVPB 3.375 g     3.375 g 100 mL/hr over 30 Minutes Intravenous  Once 05/01/16 1258 05/01/16 1340   05/01/16 1300  vancomycin (VANCOCIN) IVPB 1000 mg/200 mL premix  Status:  Discontinued     1,000 mg 200 mL/hr over 60 Minutes Intravenous  Once 05/01/16 1258 05/01/16 1308       Subjective:  Tired.  Denies SOB, cough, vomiting.  Poor appetite.    Objective: Vitals:   05/20/16 1137 05/20/16 1200 05/20/16 1230 05/20/16 1300  BP: (!) 146/83 140/77 133/73 122/66  Pulse: 95 100 (!) 102 (!) 107  Resp:      Temp:      TempSrc:      SpO2:      Weight:      Height:        Intake/Output Summary (Last 24 hours) at 05/20/16 1407 Last data filed at 05/20/16 1000  Gross per 24 hour  Intake                0 ml  Output                0 ml  Net                0 ml   Filed Weights   05/18/16 0608 05/19/16 0509 05/20/16 0500  Weight: 111.8 kg (246 lb 8 oz) 111.8 kg (246 lb 8 oz) (!) 140.2 kg (309 lb)    Examination:  General exam:  Adult female, obese.  Awake, alert HEENT:  NCAT, MMM  Respiratory system: Clear to auscultation bilaterally Cardiovascular system: Regular rate and rhythm, normal S1/S2. No murmurs, rubs, gallops or clicks.  Warm extremities Gastrointestinal system: Normal active bowel sounds, soft, nondistended, nontender. MSK:  Normal tone and bulk, legs appear wrinkled as if previous swelling has  improved.  1+ edema bilateral lower extremities Neuro:  Diffusely weak, decreased muscle tone and bulk Skin:  Posterior thigh not observed today   Data Reviewed: I have personally reviewed following labs and imaging studies  CBC:  Recent Labs Lab 05/14/16 2113 05/15/16 0713 05/17/16 0926 05/19/16 1559 05/20/16 0647  WBC 14.7* 15.0* 14.9* 13.9* 13.5*  NEUTROABS  --   --   --  10.1*  --   HGB 8.3* 8.5* 7.9* 8.8* 8.7*  HCT 27.6* 26.4* 25.9* 27.9* 27.9*  MCV 89.6 90.7 88.7 87.7 88.3  PLT 225 245 257 356 250   Basic Metabolic Panel:  Recent Labs Lab 05/14/16 0712 05/14/16 2113 05/17/16 0926 05/20/16 0647  NA 137 137 135 133*  K 4.3 4.7 3.9 4.6  CL 98* 101 95* 93*  CO2 _0 GLUCOSE 179* 171* 206* 185*  BUN 36* 45* 52* 68*  CREATININE 3.17* 3.41* 4.38* 5.63*  CALCIUM 9.0 8.9 9.2 9.1  PHOS  --  3.5 4.1 4.9*   GFR: Estimated Creatinine Clearance: 19.4 mL/min (A) (by C-G formula based on SCr of 5.63 mg/dL (H)). Liver Function Tests:  Recent Labs Lab 05/14/16 2113 05/17/16 0926 05/20/16 0647  ALBUMIN 1.8* 2.0* 2.0*   No results for input(s): LIPASE, AMYLASE in the last 168 hours. No results for input(s): AMMONIA in the last 168 hours. Coagulation Profile: No results for input(s): INR, PROTIME in the last 168 hours. Cardiac Enzymes:  Recent Labs Lab 05/19/16 1559  CKTOTAL 62   BNP (last 3 results) No results for input(s): PROBNP in the last 8760 hours. HbA1C: No results for input(s): HGBA1C in the last 72 hours. CBG:  Recent Labs Lab 05/19/16 0819 05/19/16 1208 05/19/16 1733 05/19/16 2140 05/20/16 0755  GLUCAP 200* 210* 222* 205* 171*   Lipid Profile: No results for input(s): CHOL, HDL, LDLCALC, TRIG, CHOLHDL, LDLDIRECT in the last 72 hours. Thyroid Function Tests: No results for input(s): TSH, T4TOTAL, FREET4, T3FREE, THYROIDAB in the last 72 hours. Anemia Panel: No results for input(s): VITAMINB12, FOLATE, FERRITIN, TIBC, IRON,  RETICCTPCT in the last 72 hours. Urine analysis:    Component Value Date/Time   COLORURINE AMBER (A) 05/16/2016 1142   APPEARANCEUR TURBID (A) 05/16/2016 1142   LABSPEC 1.019 05/16/2016 1142   PHURINE 5.0 05/16/2016 1142   GLUCOSEU NEGATIVE 05/16/2016 1142   HGBUR MODERATE (A) 05/16/2016 1142   BILIRUBINUR NEGATIVE 05/16/2016 1142   KETONESUR NEGATIVE 05/16/2016 1142   PROTEINUR 100 (A) 05/16/2016 1142   UROBILINOGEN 0.2 10/26/2013 1721   NITRITE NEGATIVE 05/16/2016 1142   LEUKOCYTESUR MODERATE (A) 05/16/2016 1142   Sepsis Labs: _1 (procalcitonin:4,lacticidven:4)  ) Recent Results (from the past 240 hour(s))  MRSA PCR Screening     Status: None   Collection Time: 05/13/16 11:26 AM  Result Value Ref Range Status   MRSA by PCR NEGATIVE NEGATIVE Final    Comment:        The GeneXpert MRSA Assay (FDA approved for NASAL specimens only), is one component of a comprehensive MRSA colonization surveillance program. It is not intended to diagnose MRSA infection nor to guide or monitor treatment for MRSA infections.   Culture, blood (Routine X 2) w Reflex to ID Panel  Status: None (Preliminary result)   Collection Time: 05/15/16  9:03 PM  Result Value Ref Range Status   Specimen Description BLOOD RIGHT HAND  Final   Special Requests   Final    BOTTLES DRAWN AEROBIC ONLY Blood Culture adequate volume   Culture NO GROWTH 4 DAYS  Final   Report Status PENDING  Incomplete  Culture, blood (Routine X 2) w Reflex to ID Panel     Status: None (Preliminary result)   Collection Time: 05/15/16  9:10 PM  Result Value Ref Range Status   Specimen Description BLOOD RIGHT HAND  Final   Special Requests IN PEDIATRIC BOTTLE Blood Culture adequate volume  Final   Culture NO GROWTH 4 DAYS  Final   Report Status PENDING  Incomplete  Culture, Urine     Status: None   Collection Time: 05/16/16 11:42 AM  Result Value Ref Range Status   Specimen Description URINE, RANDOM  Final    Special Requests NONE  Final   Culture NO GROWTH  Final   Report Status 05/17/2016 FINAL  Final      Radiology Studies: Ct Abdomen Pelvis Wo Contrast  Result Date: 05/20/2016 CLINICAL DATA:  41 y/o  F; fever of unknown origin. EXAM: CT ABDOMEN AND PELVIS WITHOUT CONTRAST TECHNIQUE: Multidetector CT imaging of the abdomen and pelvis was performed following the standard protocol without IV contrast. COMPARISON:  None. FINDINGS: Lower chest: No acute abnormality. Hepatobiliary: No focal liver abnormality is seen. No gallstones, gallbladder wall thickening, or biliary dilatation. Pancreas: Unremarkable. No pancreatic ductal dilatation or surrounding inflammatory changes. Spleen: Normal in size without focal abnormality. Adrenals/Urinary Tract: Adrenal glands are unremarkable. Kidneys are normal, without renal calculi, focal lesion, or hydronephrosis. Bladder is unremarkable. Stomach/Bowel: Stomach is within normal limits. Appendix appears normal. No evidence of bowel wall thickening, distention, or inflammatory changes. Vascular/Lymphatic: Aortic atherosclerosis. No enlarged abdominal or pelvic lymph nodes. Reproductive: Uterus and bilateral adnexa are unremarkable. Other: No abdominal wall hernia or abnormality. No abdominopelvic ascites. Musculoskeletal: Partially visualized is apparent fat stranding with skin laceration involving the left medial arm probably related to recent placement of AV fistula. Within the field of view no discrete fluid collection is identified. No acute osseous abnormality identified. Lumbar spondylosis predominantly at the L5-S1 level. IMPRESSION: 1. No acute process identified in the abdomen or pelvis as explanation for fever. 2. Air and fat stranding within the left medial arm likely representing postsurgical changes related to recent placement of AV fistula. Within the field of view no discrete fluid collection is identified. Electronically Signed   By: Kristine Garbe  M.D.   On: 05/20/2016 00:01     Scheduled Meds: . albuterol  3 mL Inhalation Q M,W,F-HD  . atorvastatin  20 mg Oral Daily  . calcium acetate  667 mg Oral TID WC  . darbepoetin (ARANESP) injection - DIALYSIS  150 mcg Intravenous Q Fri-HD  . feeding supplement (NEPRO CARB STEADY)  237 mL Oral TID BM  . ferric gluconate (FERRLECIT/NULECIT) IV  125 mg Intravenous Q M,W,F-HD  . gi cocktail  30 mL Oral Once  . heparin  40 Units/kg Dialysis Once in dialysis  . heparin  5,000 Units Subcutaneous Q8H  . insulin aspart  0-5 Units Subcutaneous QHS  . insulin aspart  0-9 Units Subcutaneous TID WC  . insulin glargine  10 Units Subcutaneous QHS  . multivitamin  1 tablet Oral QHS  . piperacillin-tazobactam (ZOSYN)  IV  3.375 g Intravenous Q12H  . polyethylene glycol  17 g Oral Daily  . senna-docusate  1 tablet Oral BID  . vancomycin  2,250 mg Intravenous Q Mon-HD  . [START ON 05/22/2016] vancomycin  1,000 mg Intravenous Q M,W,F-HD   Continuous Infusions:   LOS: 19 days    Time spent: 30 min    Janece Canterbury, MD [facility] Pager [phone]  If 7PM-7AM, please contact night-coverage [email or web address] Password [email or web address] 05/20/2016, 2:07 PM

## 2016-05-20 NOTE — Progress Notes (Signed)
Pharmacy Antibiotic Note  [name] is a 41 y.o. female admitted on 05/01/2016 with weakness and fever. Pharmacy has been consulted for Vancomycin + Zosyn dosing for empiric coverage in the setting of fevers and known B/L LE inner thigh wounds. Consider general surgery consult for evaluation. New ESRD - currently on MWF schedule.   The patient has received Vancomycin 2500 mg on 3/21 (VR on 3/23 ~36h after LD was 25 mcg/ml) and Vancomycin 1g on 3/28 - there have been 6 HD sessions since the last Vancomycin dose so it is presumed that there is minimal to no residual Vancomycin left at this time.   Plan: 1. Start Zosyn 3.375g IV every 12 hours 2. Vancomycin 2250 mg after HD today (Mon, 4/9), then to start Vancomycin 1 g/HD-MWF on Wed, 4/11 3. Will continue to follow HD schedule/duration, culture results, LOT, and antibiotic de-escalation plans   Height:  (167.6 cm) Weight: (!) 309 lb (140.2 kg) IBW/kg (Calculated) : 59.3  Temp (24hrs), Avg:99.2 F (37.3 C), Min:98.8 F (37.1 C), Max:99.7 F (37.6 C)   Recent Labs Lab 05/14/16 0712 05/14/16 2113 05/15/16 0713 05/17/16 0926 05/19/16 1559 05/20/16 0647  WBC 15.7* 14.7* 15.0* 14.9* 13.9* 13.5*  CREATININE 3.17* 3.41*  --  4.38*  --  5.63*    Estimated Creatinine Clearance: 19.4 mL/min (A) (by C-G formula based on SCr of 5.63 mg/dL (H)).    Allergies  Allergen Reactions  . Azithromycin Other (See Comments)    Nose bleeding event    Antimicrobials this admission: 3/21 Vanc >> 3/22 3/21 Zosyn >> 3/22 3/22 Doxy>>4/4 4/1 Ceftaz >>4/4   Dose adjustments this admission: 3/23 @ 0530:  Vanc random 25   Microbiology results: 4/5 UC: pending 4/4 Bcx: ngtd 3/21 BCx: CONS in 1/2 bottles 3/21 UCx: neg 3/21 HIV Ab: neg  Thank you for allowing pharmacy to be a part of this patient's care.  Georgina Pillion, PharmD, BCPS Clinical Pharmacist Pager: [phone] Clinical phone for 05/20/2016 from 7a-3:30p: [phone] If  after 3:30p, please call main pharmacy at: [phone] 05/20/2016 10:25 AM

## 2016-05-20 NOTE — Progress Notes (Signed)
[name] able to accept patient when stable.  [name] LCSWA [phone]

## 2016-05-20 NOTE — Consult Note (Signed)
Reason for Consult:Gangrenous thigh Referring Physician: M Short  [name] is an 41 y.o. female.  HPI: Morbidly obese woman admitted on 05/01/16 with weakness and fever.  She has multiple medical issues as listed below.  She was seen by wound care today with Moisture associated skin issues.  Skin care nurse notes show:   Left thigh 10cm x 9cm x 0.1cm  Right thigh 4cm x 3.5cm x 0.1cm  Wound bed: Left thigh 80% black, soft/20% yellow at wound perimeter Right thigh; 90% pink, moist, 10% yellow/brown crust Drainage (amount, consistency, odor) scant, slight odor Periwound: brawny edema of the right lower leg, induration note in the left thigh circumferentially.  We are ask to see.     Past Medical History:  Diagnosis Date  . Anasarca 02/05/2015  . Asthma   . CHF (congestive heart failure) (HCC)    diastolic  . CVA (cerebral infarction) 1990's   "writing is not the same since" R leg weakness   . GERD (gastroesophageal reflux disease)   . Hypertension   . Morbid obesity ([place])   . Palliative care encounter   . Stroke ([place])   . Type II diabetes mellitus ([place])     Past Surgical History:  Procedure Laterality Date  . AV FISTULA PLACEMENT Left 05/14/2016   Procedure: ARTERIOVENOUS (AV) LEFT UPPER ARM USING GORETEX STRETCHED VASCULAR GRAFT;  Surgeon: Angelia Mould, MD;  Location: [place];  Service: Vascular;  Laterality: Left;  . INCISION AND DRAINAGE OF WOUND Left 08/30/2013   thigh necrotic wound/notes 08/30/2013  . IR GENERIC HISTORICAL  05/07/2016   IR FLUORO GUIDE CV LINE RIGHT 05/07/2016 Markus Daft, MD [facility]  . IR GENERIC HISTORICAL  05/07/2016   IR US GUIDE VASC ACCESS RIGHT 05/07/2016 Markus Daft, MD [facility]  . IRRIGATION AND DEBRIDEMENT ABSCESS Left 08/30/2013   Procedure: IRRIGATION AND DEBRIDEMENT ABSCESS Left Thigh Necrotic Wound;  Surgeon: Ralene Ok, MD;  Location: [place];  Service: General;  Laterality: Left;  . TEE WITHOUT CARDIOVERSION N/A  09/07/2013   Procedure: TRANSESOPHAGEAL ECHOCARDIOGRAM (TEE);  Surgeon: Pixie Casino, MD;  Location: [facility] ENDOSCOPY;  Service: Cardiovascular;  Laterality: N/A;  . TEE WITHOUT CARDIOVERSION N/A 02/08/2015   Procedure: TRANSESOPHAGEAL ECHOCARDIOGRAM (TEE);  Surgeon: Skeet Latch, MD;  Location: [facility] ENDOSCOPY;  Service: Cardiovascular;  Laterality: N/A;    Family History  Problem Relation Age of Onset  . Diabetes Mother   . Kidney disease Mother     Social History:  reports that she has never smoked. She has never used smokeless tobacco. She reports that she does not drink alcohol or use drugs.  Allergies:  Allergies  Allergen Reactions  . Azithromycin Other (See Comments)    Nose bleeding event    Medications:  Prior to Admission:  Prescriptions Prior to Admission  Medication Sig Dispense Refill Last Dose  . albuterol (PROAIR HFA) 108 (90 Base) MCG/ACT inhaler Inhale 2 puffs into the lungs every 6 (six) hours as needed for wheezing or shortness of breath. 1 Inhaler 0 04/30/2016 at Unknown time  . albuterol (PROVENTIL) (2.5 MG/3ML) 0.083% nebulizer solution Take 3 mLs (2.5 mg total) by nebulization every 6 (six) hours as needed for wheezing or shortness of breath. 75 mL 0 04/30/2016 at Unknown time  . amLODipine (NORVASC) 10 MG tablet Take 1 tablet (10 mg total) by mouth daily. 30 tablet 0 04/30/2016 at Unknown time  . atorvastatin (LIPITOR) 20 MG tablet Take 20 mg by mouth daily.   04/30/2016  at Unknown time  . carvedilol (COREG) 25 MG tablet Take 1 tablet (25 mg total) by mouth 2 (two) times daily with a meal. 60 tablet 1 04/30/2016 at 1900  . ferrous sulfate 325 (65 FE) MG tablet Take 1 tablet (325 mg total) by mouth 3 (three) times daily with meals. 90 tablet 3 04/30/2016 at Unknown time  . furosemide (LASIX) 80 MG tablet Take 2 tablets (160 mg total) by mouth every 6 (six) hours. 120 tablet 0 04/30/2016 at Unknown time  . hydrALAZINE (APRESOLINE) 50 MG tablet Take 2 tablets (100 mg  total) by mouth 3 (three) times daily. 90 tablet 0 04/30/2016 at Unknown time  . insulin aspart (NOVOLOG) 100 UNIT/ML injection Inject 0-20 Units into the skin 3 (three) times daily with meals. CBG < 70: implement hypoglycemia protocol CBG 70 - 120: 0 units CBG 121 - 150: 3 units CBG 151 - 200: 4 units CBG 201 - 250: 7 units CBG 251 - 300: 11 units CBG 301 - 350: 15 units CBG 351 - 400: 20 units 10 mL 0 Past Week at Unknown time  . isosorbide mononitrate (IMDUR) 30 MG 24 hr tablet Take 1 tablet (30 mg total) by mouth daily. 30 tablet 3 Past Week at Unknown time  . metolazone (ZAROXOLYN) 10 MG tablet Take 1 tablet (10 mg total) by mouth daily. 30 tablet 0 04/30/2016 at Unknown time  . polyethylene glycol powder (GLYCOLAX/MIRALAX) powder Dissolve 1 capful into 4 to 8 oz of fluid  4 04/30/2016 at Unknown time  . potassium chloride SA (K-DUR,KLOR-CON) 20 MEQ tablet Take 2 tablets (40 mEq total) by mouth daily. 30 tablet 0 04/30/2016 at Unknown time  . vitamin A 8000 UNIT capsule Take 8,000 Units by mouth daily.   04/30/2016 at Unknown time  . camphor-menthol (SARNA) lotion Apply 1 application topically every 8 (eight) hours as needed for itching. 222 mL 0 unknown   Scheduled: . albuterol  3 mL Inhalation Q M,W,F-HD  . atorvastatin  20 mg Oral Daily  . calcium acetate  667 mg Oral TID WC  . darbepoetin (ARANESP) injection - DIALYSIS  150 mcg Intravenous Q Fri-HD  . feeding supplement (NEPRO CARB STEADY)  237 mL Oral TID BM  . ferric gluconate (FERRLECIT/NULECIT) IV  125 mg Intravenous Q M,W,F-HD  . gi cocktail  30 mL Oral Once  . heparin  40 Units/kg Dialysis Once in dialysis  . heparin  5,000 Units Subcutaneous Q8H  . insulin aspart  0-5 Units Subcutaneous QHS  . insulin aspart  0-9 Units Subcutaneous TID WC  . insulin glargine  10 Units Subcutaneous QHS  . multivitamin  1 tablet Oral QHS  . piperacillin-tazobactam (ZOSYN)  IV  3.375 g Intravenous Q12H  . polyethylene glycol  17 g Oral Daily    . senna-docusate  1 tablet Oral BID  . vancomycin  2,250 mg Intravenous Q Mon-HD  . [START ON 05/22/2016] vancomycin  1,000 mg Intravenous Q M,W,F-HD   Continuous:  EXN:TZGYFV chloride, sodium chloride, acetaminophen, albuterol, alteplase, camphor-menthol, guaiFENesin-dextromethorphan, heparin, HYDROcodone-acetaminophen, lidocaine (PF), lidocaine-prilocaine, morphine injection, nitroGLYCERIN, ondansetron **OR** ondansetron (ZOFRAN) IV, oxyCODONE-acetaminophen, pentafluoroprop-tetrafluoroeth Anti-infectives    Start     Dose/Rate Route Frequency Ordered Stop   05/22/16 1200  vancomycin (VANCOCIN) 1,250 mg in sodium chloride 0.9 % 250 mL IVPB  Status:  Discontinued     1,250 mg 250 mL/hr over 60 Minutes Intravenous Every M-W-F (Hemodialysis) 05/20/16 1024 05/20/16 1027   05/22/16 1200  vancomycin (VANCOCIN) IVPB 1000  mg/200 mL premix     1,000 mg 200 mL/hr over 60 Minutes Intravenous Every M-W-F (Hemodialysis) 05/20/16 1027     05/20/16 1200  vancomycin (VANCOCIN) 2,250 mg in sodium chloride 0.9 % 250 mL IVPB     2,250 mg 250 mL/hr over 60 Minutes Intravenous Every Mon (Hemodialysis) 05/20/16 1024 05/27/16 1159   05/20/16 1030  piperacillin-tazobactam (ZOSYN) IVPB 3.375 g     3.375 g 12.5 mL/hr over 240 Minutes Intravenous Every 12 hours 05/20/16 1012     05/16/16 1630  cefTRIAXone (ROCEPHIN) 1 g in dextrose 5 % 50 mL IVPB  Status:  Discontinued     1 g 100 mL/hr over 30 Minutes Intravenous Every 24 hours 05/16/16 1625 05/17/16 1641   05/14/16 1430  cefTAZidime (FORTAZ) 1 g in dextrose 5 % 50 mL IVPB  Status:  Discontinued     1 g 100 mL/hr over 30 Minutes Intravenous Every 24 hours 05/14/16 1406 05/15/16 1325   05/14/16 1430  doxycycline (VIBRA-TABS) tablet 100 mg  Status:  Discontinued     100 mg Oral Every 12 hours 05/14/16 1406 05/15/16 1325   05/14/16 0000  ceFAZolin (ANCEF) IVPB 1 g/50 mL premix  Status:  Discontinued    Comments:  Send with pt to OR   1 g 100 mL/hr over 30  Minutes Intravenous On call 05/13/16 1630 05/15/16 1325   05/12/16 1330  cefTAZidime (FORTAZ) 1 g in dextrose 5 % 50 mL IVPB  Status:  Discontinued     1 g 100 mL/hr over 30 Minutes Intravenous Every 24 hours 05/12/16 1319 05/14/16 1406   05/08/16 1000  vancomycin (VANCOCIN) IVPB 1000 mg/200 mL premix     1,000 mg 200 mL/hr over 60 Minutes Intravenous To Surgery 05/07/16 1904 05/08/16 1004   05/08/16 0902  vancomycin (VANCOCIN) 1-5 GM/200ML-% IVPB    Comments:  Vernell Leep, Speed   : cabinet override      05/08/16 0902 05/08/16 0904   05/07/16 1300  ceFAZolin (ANCEF) 3 g in dextrose 5 % 50 mL IVPB     3 g 130 mL/hr over 30 Minutes Intravenous To Radiology 05/07/16 1232 05/07/16 1330   05/02/16 2200  doxycycline (VIBRA-TABS) tablet 100 mg  Status:  Discontinued     100 mg Oral Every 12 hours 05/02/16 1732 05/14/16 1406   05/01/16 2200  piperacillin-tazobactam (ZOSYN) IVPB 2.25 g  Status:  Discontinued     2.25 g 100 mL/hr over 30 Minutes Intravenous Every 8 hours 05/01/16 1324 05/02/16 1729   05/01/16 1330  vancomycin (VANCOCIN) 2,500 mg in sodium chloride 0.9 % 500 mL IVPB     2,500 mg 250 mL/hr over 120 Minutes Intravenous  Once 05/01/16 1309 05/01/16 1551   05/01/16 1300  piperacillin-tazobactam (ZOSYN) IVPB 3.375 g     3.375 g 100 mL/hr over 30 Minutes Intravenous  Once 05/01/16 1258 05/01/16 1340   05/01/16 1300  vancomycin (VANCOCIN) IVPB 1000 mg/200 mL premix  Status:  Discontinued     1,000 mg 200 mL/hr over 60 Minutes Intravenous  Once 05/01/16 1258 05/01/16 1308      Results for orders placed or performed during the hospital encounter of 05/01/16 (from the past 48 hour(s))  Glucose, capillary     Status: Abnormal   Collection Time: 05/18/16 11:57 AM  Result Value Ref Range   Glucose-Capillary 177 (H) 65 - 99 mg/dL   Comment 1 Notify RN   Glucose, capillary     Status: Abnormal   Collection Time:  05/18/16  5:10 PM  Result Value Ref Range   Glucose-Capillary 184 (H) 65 -  99 mg/dL   Comment 1 Notify RN   Glucose, capillary     Status: Abnormal   Collection Time: 05/18/16  9:22 PM  Result Value Ref Range   Glucose-Capillary 194 (H) 65 - 99 mg/dL  Glucose, capillary     Status: Abnormal   Collection Time: 05/19/16  8:19 AM  Result Value Ref Range   Glucose-Capillary 200 (H) 65 - 99 mg/dL  Glucose, capillary     Status: Abnormal   Collection Time: 05/19/16 12:08 PM  Result Value Ref Range   Glucose-Capillary 210 (H) 65 - 99 mg/dL  Sedimentation rate     Status: None   Collection Time: 05/19/16  3:59 PM  Result Value Ref Range   Sed Rate 4 0 - 22 mm/hr  Lactate dehydrogenase     Status: Abnormal   Collection Time: 05/19/16  3:59 PM  Result Value Ref Range   LDH 323 (H) 98 - 192 U/L  CK     Status: None   Collection Time: 05/19/16  3:59 PM  Result Value Ref Range   Total CK 62 38 - 234 U/L  CBC with Differential/Platelet     Status: Abnormal   Collection Time: 05/19/16  3:59 PM  Result Value Ref Range   WBC 13.9 (H) 4.0 - 10.5 K/uL   RBC 3.18 (L) 3.87 - 5.11 MIL/uL   Hemoglobin 8.8 (L) 12.0 - 15.0 g/dL   HCT 27.9 (L) 36.0 - 46.0 %   MCV 87.7 78.0 - 100.0 fL   MCH 27.7 26.0 - 34.0 pg   MCHC 31.5 30.0 - 36.0 g/dL   RDW 15.1 11.5 - 15.5 %   Platelets 356 150 - 400 K/uL   Neutrophils Relative % 72 %   Lymphocytes Relative 14 %   Monocytes Relative 13 %   Eosinophils Relative 1 %   Basophils Relative 0 %   Neutro Abs 10.1 (H) 1.7 - 7.7 K/uL   Lymphs Abs 1.9 0.7 - 4.0 K/uL   Monocytes Absolute 1.8 (H) 0.1 - 1.0 K/uL   Eosinophils Absolute 0.1 0.0 - 0.7 K/uL   Basophils Absolute 0.0 0.0 - 0.1 K/uL   WBC Morphology SMUDGE CELLS    Smear Review LARGE PLATELETS PRESENT   Glucose, capillary     Status: Abnormal   Collection Time: 05/19/16  5:33 PM  Result Value Ref Range   Glucose-Capillary 222 (H) 65 - 99 mg/dL  Glucose, capillary     Status: Abnormal   Collection Time: 05/19/16  9:40 PM  Result Value Ref Range   Glucose-Capillary 205 (H) 65  - 99 mg/dL  CBC     Status: Abnormal   Collection Time: 05/20/16  6:47 AM  Result Value Ref Range   WBC 13.5 (H) 4.0 - 10.5 K/uL   RBC 3.16 (L) 3.87 - 5.11 MIL/uL   Hemoglobin 8.7 (L) 12.0 - 15.0 g/dL   HCT 27.9 (L) 36.0 - 46.0 %   MCV 88.3 78.0 - 100.0 fL   MCH 27.5 26.0 - 34.0 pg   MCHC 31.2 30.0 - 36.0 g/dL   RDW 15.6 (H) 11.5 - 15.5 %   Platelets 349 150 - 400 K/uL  Renal function panel     Status: Abnormal   Collection Time: 05/20/16  6:47 AM  Result Value Ref Range   Sodium 133 (L) 135 - 145 mmol/L   Potassium 4.6 3.5 -  5.1 mmol/L   Chloride 93 (L) 101 - 111 mmol/L   CO2 23 22 - 32 mmol/L   Glucose, Bld 185 (H) 65 - 99 mg/dL   BUN 68 (H) 6 - 20 mg/dL   Creatinine, Ser 5.63 (H) 0.44 - 1.00 mg/dL   Calcium 9.1 8.9 - 10.3 mg/dL   Phosphorus 4.9 (H) 2.5 - 4.6 mg/dL   Albumin 2.0 (L) 3.5 - 5.0 g/dL   GFR calc non Af Amer 9 (L) >60 mL/min   GFR calc Af Amer 10 (L) >60 mL/min    Comment: (NOTE) The eGFR has been calculated using the CKD EPI equation. This calculation has not been validated in all clinical situations. eGFR's persistently <60 mL/min signify possible Chronic Kidney Disease.    Anion gap 17 (H) 5 - 15  Glucose, capillary     Status: Abnormal   Collection Time: 05/20/16  7:55 AM  Result Value Ref Range   Glucose-Capillary 171 (H) 65 - 99 mg/dL    Ct Abdomen Pelvis Wo Contrast  Result Date: 05/20/2016 CLINICAL DATA:  41 y/o  F; fever of unknown origin. EXAM: CT ABDOMEN AND PELVIS WITHOUT CONTRAST TECHNIQUE: Multidetector CT imaging of the abdomen and pelvis was performed following the standard protocol without IV contrast. COMPARISON:  None. FINDINGS: Lower chest: No acute abnormality. Hepatobiliary: No focal liver abnormality is seen. No gallstones, gallbladder wall thickening, or biliary dilatation. Pancreas: Unremarkable. No pancreatic ductal dilatation or surrounding inflammatory changes. Spleen: Normal in size without focal abnormality. Adrenals/Urinary Tract:  Adrenal glands are unremarkable. Kidneys are normal, without renal calculi, focal lesion, or hydronephrosis. Bladder is unremarkable. Stomach/Bowel: Stomach is within normal limits. Appendix appears normal. No evidence of bowel wall thickening, distention, or inflammatory changes. Vascular/Lymphatic: Aortic atherosclerosis. No enlarged abdominal or pelvic lymph nodes. Reproductive: Uterus and bilateral adnexa are unremarkable. Other: No abdominal wall hernia or abnormality. No abdominopelvic ascites. Musculoskeletal: Partially visualized is apparent fat stranding with skin laceration involving the left medial arm probably related to recent placement of AV fistula. Within the field of view no discrete fluid collection is identified. No acute osseous abnormality identified. Lumbar spondylosis predominantly at the L5-S1 level. IMPRESSION: 1. No acute process identified in the abdomen or pelvis as explanation for fever. 2. Air and fat stranding within the left medial arm likely representing postsurgical changes related to recent placement of AV fistula. Within the field of view no discrete fluid collection is identified. Electronically Signed   By: Kristine Garbe M.D.   On: 05/20/2016 00:01    Review of Systems  Constitutional: Positive for fever (intermittent low grade) and weight loss. Negative for chills, diaphoresis and malaise/fatigue.  HENT: Negative.   Eyes: Negative.   Respiratory: Positive for shortness of breath. Negative for cough.        Recurrent issues with hypoxia  Cardiovascular: Negative.   Gastrointestinal: Negative.   Genitourinary:       On Dialysis, does not make any urine.  Musculoskeletal:       Bed ridden for a year, up to chair only.  She does not walk.  Skin:       See notes and picture below.   Neurological: Negative.  Negative for weakness.  Endo/Heme/Allergies: Bruises/bleeds easily.  Psychiatric/Behavioral: Positive for depression.   Blood pressure 129/74,  pulse 100, temperature 98.7 F (37.1 C), temperature source Oral, resp. rate 16, height _0  (1.676 m), weight (!) 140.2 kg (309 lb), last menstrual period 04/11/2016, SpO2 95 %. Physical Exam  Constitutional:  She is oriented to person, place, and time. No distress.  Morbidly obese woman bed ridden for a year.  Alert and oriented just going on HD.  HENT:  Head: Normocephalic and atraumatic.  Mouth/Throat: No oropharyngeal exudate.  Eyes: Right eye exhibits no discharge. Left eye exhibits no discharge. No scleral icterus.  Pupils are equal  Neck: Neck supple. JVD present. No tracheal deviation present. No thyromegaly present.  Old IJ HD site right neck  Cardiovascular: Normal rate, regular rhythm and normal heart sounds.   No murmur heard. Respiratory: Effort normal and breath sounds normal. No respiratory distress. She has no wheezes. She has no rales. She exhibits no tenderness.  GI: Soft. Bowel sounds are normal. She exhibits no distension and no mass. There is no tenderness. There is no rebound and no guarding.  She is up in the chair so it is hard to examine  Musculoskeletal: She exhibits edema and tenderness.  Lymphadenopathy:    She has no cervical adenopathy.  Neurological: She is alert and oriented to person, place, and time. No cranial nerve deficit.  Skin: Skin is warm and dry. She is not diaphoretic. There is erythema.   Left thigh 10cm x 9cm x 0.1cm  Right thigh 4cm x 3.5cm x 0.1cm  Wound bed: Left thigh 80% black, soft/20% yellow at wound perimeter Right thigh; 90% pink, moist, 10% yellow/brown crust Drainage (amount, consistency, odor) scant, slight odor Periwound: brawny edema of the right lower leg, induration note in the left thigh circumferentially.   Psychiatric: She has a normal mood and affect. Her behavior is normal. Judgment and thought content normal.      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 30 kg down but still edema. Pulmonary edema/Acute respiratory failure - now resolved on room air  HTN  AODM with poor control - diabetic neuropathy  anemia  HPTH vit d  Massive obesty  Immobility Fevers undergoing eval  Anxiety She is currently on Vancomycin and Zosyn for fever.    Plan:  I think we can do local wound care to the right thigh and this should clean up.  On the left she has full skin thickness necrosis with odor and drainage.  I think this needs to be debrided in the OR.  I don't think it will heal, but I don't think we can leave it either.  I will pre op her for debridement of both medial thighs, but I think we will be most concerned with the left.    Latica Hohmann 05/20/2016, 11:23 AM

## 2016-05-20 NOTE — Progress Notes (Signed)
OT Cancellation    05/20/16 1200  OT Visit Information  Last OT Received On 05/20/16  Assistance Needed +2  Reason Eval/Treat Not Completed Patient at procedure or test/ unavailable (HD)   [place], OTR/L [phone]

## 2016-05-21 ENCOUNTER — Encounter (HOSPITAL_COMMUNITY): Payer: Self-pay | Admitting: Certified Registered"

## 2016-05-21 ENCOUNTER — Inpatient Hospital Stay (HOSPITAL_COMMUNITY): Payer: Medicaid Other | Admitting: Certified Registered"

## 2016-05-21 ENCOUNTER — Encounter (HOSPITAL_COMMUNITY)
Admission: EM | Disposition: A | Discharge status: Skilled Nursing Facility | Payer: Self-pay | Source: Home / Self Care | Attending: Internal Medicine

## 2016-05-21 DIAGNOSIS — R509 Fever, unspecified: Secondary | ICD-10-CM

## 2016-05-21 HISTORY — PX: WOUND DEBRIDEMENT: SHX247

## 2016-05-21 LAB — ANA W/REFLEX IF POSITIVE: Anti Nuclear Antibody(ANA): POSITIVE — AB

## 2016-05-21 LAB — RENAL FUNCTION PANEL
ALBUMIN: 2.3 g/dL — AB (ref 3.5–5.0)
Anion gap: 17 — ABNORMAL HIGH (ref 5–15)
BUN: 45 mg/dL — AB (ref 6–20)
CALCIUM: 9.2 mg/dL (ref 8.9–10.3)
CO2: 21 mmol/L — ABNORMAL LOW (ref 22–32)
Chloride: 96 mmol/L — ABNORMAL LOW (ref 101–111)
Creatinine, Ser: 4.22 mg/dL — ABNORMAL HIGH (ref 0.44–1.00)
GFR calc Af Amer: 14 mL/min — ABNORMAL LOW (ref >60)
GFR calc non Af Amer: 12 mL/min — ABNORMAL LOW (ref >60)
GLUCOSE: 198 mg/dL — AB (ref 65–99)
PHOSPHORUS: 5.6 mg/dL — AB (ref 2.5–4.6)
POTASSIUM: 4.5 mmol/L (ref 3.5–5.1)
SODIUM: 134 mmol/L — AB (ref 135–145)

## 2016-05-21 LAB — ENA+DNA/DS+ANTICH+CENTRO+JO...
CHROMATIN AB SERPL-ACNC: 0.2 AI (ref 0.0–0.9)
Centromere Ab Screen: <0.2 AI (ref 0.0–0.9)
ENA SM AB SER-ACNC: 0.3 AI (ref 0.0–0.9)
Ribonucleic Protein: 0.3 AI (ref 0.0–0.9)
SCLERODERMA (SCL-70) (ENA) ANTIBODY, IGG: 7.9 AI — AB (ref 0.0–0.9)
SSA (Ro) (ENA) Antibody, IgG: 0.4 AI (ref 0.0–0.9)
SSB (La) (ENA) Antibody, IgG: <0.2 AI (ref 0.0–0.9)
ds DNA Ab: 1 IU/mL (ref 0–9)

## 2016-05-21 LAB — PROTEIN ELECTROPHORESIS, SERUM
A/G Ratio: 0.4 — ABNORMAL LOW (ref 0.7–1.7)
ALPHA-2-GLOBULIN: 2.1 g/dL — AB (ref 0.4–1.0)
Albumin ELP: 2.4 g/dL — ABNORMAL LOW (ref 2.9–4.4)
Alpha-1-Globulin: 0.5 g/dL — ABNORMAL HIGH (ref 0.0–0.4)
Beta Globulin: 0.8 g/dL (ref 0.7–1.3)
GLOBULIN, TOTAL: 5.6 g/dL — AB (ref 2.2–3.9)
Gamma Globulin: 2.1 g/dL — ABNORMAL HIGH (ref 0.4–1.8)
TOTAL PROTEIN ELP: 8 g/dL (ref 6.0–8.5)

## 2016-05-21 LAB — CBC
HEMATOCRIT: 29.7 % — AB (ref 36.0–46.0)
Hemoglobin: 9.2 g/dL — ABNORMAL LOW (ref 12.0–15.0)
MCH: 27.4 pg (ref 26.0–34.0)
MCHC: 31 g/dL (ref 30.0–36.0)
MCV: 88.4 fL (ref 78.0–100.0)
Platelets: 390 10*3/uL (ref 150–400)
RBC: 3.36 MIL/uL — ABNORMAL LOW (ref 3.87–5.11)
RDW: 15.7 % — AB (ref 11.5–15.5)
WBC: 14.3 10*3/uL — ABNORMAL HIGH (ref 4.0–10.5)

## 2016-05-21 LAB — GLUCOSE, CAPILLARY
Glucose-Capillary: 187 mg/dL — ABNORMAL HIGH (ref 65–99)
Glucose-Capillary: 189 mg/dL — ABNORMAL HIGH (ref 65–99)
Glucose-Capillary: 197 mg/dL — ABNORMAL HIGH (ref 65–99)
Glucose-Capillary: 209 mg/dL — ABNORMAL HIGH (ref 65–99)
Glucose-Capillary: 192 mg/dL — ABNORMAL HIGH (ref 65–99)

## 2016-05-21 LAB — CULTURE, BLOOD (ROUTINE X 2)
CULTURE: NO GROWTH
CULTURE: NO GROWTH
Special Requests: ADEQUATE
Special Requests: ADEQUATE

## 2016-05-21 LAB — I-STAT BETA HCG BLOOD, ED (NOT ORDERABLE): HCG, QUANTITATIVE: 13.2 m[IU]/mL — AB (ref <5)

## 2016-05-21 LAB — SURGICAL PCR SCREEN
MRSA, PCR: NEGATIVE
Staphylococcus aureus: NEGATIVE

## 2016-05-21 SURGERY — DEBRIDEMENT, WOUND
Anesthesia: General | Site: Thigh | Laterality: Left

## 2016-05-21 MED ORDER — ACETAMINOPHEN 325 MG PO TABS
ORAL_TABLET | ORAL | Status: AC
  Filled 2016-05-21: qty 1

## 2016-05-21 MED ORDER — SUCCINYLCHOLINE CHLORIDE 20 MG/ML IJ SOLN
INTRAMUSCULAR | Status: DC | PRN
  Administered 2016-05-21: 100 mg via INTRAVENOUS

## 2016-05-21 MED ORDER — ONDANSETRON HCL 4 MG/2ML IJ SOLN
INTRAMUSCULAR | Status: DC | PRN
  Administered 2016-05-21: 4 mg via INTRAVENOUS

## 2016-05-21 MED ORDER — LIDOCAINE HCL (CARDIAC) 20 MG/ML IV SOLN
INTRAVENOUS | Status: DC | PRN
  Administered 2016-05-21: 70 mg via INTRAVENOUS

## 2016-05-21 MED ORDER — FENTANYL CITRATE (PF) 250 MCG/5ML IJ SOLN
INTRAMUSCULAR | Status: AC
  Filled 2016-05-21: qty 5

## 2016-05-21 MED ORDER — SEVELAMER CARBONATE 800 MG PO TABS
800.0000 mg | ORAL_TABLET | Freq: Three times a day (TID) | ORAL | Status: DC
  Administered 2016-05-23 – 2016-05-30 (×11): 800 mg via ORAL
  Filled 2016-05-21 (×19): qty 1

## 2016-05-21 MED ORDER — PROPOFOL 10 MG/ML IV BOLUS
INTRAVENOUS | Status: AC
  Filled 2016-05-21: qty 20

## 2016-05-21 MED ORDER — FENTANYL CITRATE (PF) 100 MCG/2ML IJ SOLN
INTRAMUSCULAR | Status: DC | PRN
  Administered 2016-05-21 (×2): 25 ug via INTRAVENOUS

## 2016-05-21 MED ORDER — MORPHINE SULFATE (PF) 4 MG/ML IV SOLN
INTRAVENOUS | Status: AC
  Filled 2016-05-21: qty 1

## 2016-05-21 MED ORDER — BISACODYL 10 MG RE SUPP
10.0000 mg | Freq: Once | RECTAL | Status: AC
  Administered 2016-05-21: 10 mg via RECTAL
  Filled 2016-05-21: qty 1

## 2016-05-21 MED ORDER — PROPOFOL 10 MG/ML IV BOLUS
INTRAVENOUS | Status: DC | PRN
  Administered 2016-05-21: 150 mg via INTRAVENOUS

## 2016-05-21 MED ORDER — MORPHINE SULFATE (PF) 4 MG/ML IV SOLN
1.0000 mg | INTRAVENOUS | Status: DC | PRN
  Administered 2016-05-21: 2 mg via INTRAVENOUS

## 2016-05-21 MED ORDER — ORAL CARE MOUTH RINSE
15.0000 mL | Freq: Two times a day (BID) | OROMUCOSAL | Status: DC
  Administered 2016-05-21 – 2016-05-31 (×16): 15 mL via OROMUCOSAL

## 2016-05-21 MED ORDER — ONDANSETRON HCL 4 MG/2ML IJ SOLN
INTRAMUSCULAR | Status: AC
  Filled 2016-05-21: qty 2

## 2016-05-21 MED ORDER — HEMOSTATIC AGENTS (NO CHARGE) OPTIME
TOPICAL | Status: DC | PRN
  Administered 2016-05-21: 1 via TOPICAL

## 2016-05-21 MED ORDER — 0.9 % SODIUM CHLORIDE (POUR BTL) OPTIME
TOPICAL | Status: DC | PRN
  Administered 2016-05-21: 1000 mL

## 2016-05-21 MED ORDER — MIDAZOLAM HCL 2 MG/2ML IJ SOLN
INTRAMUSCULAR | Status: AC
  Filled 2016-05-21: qty 2

## 2016-05-21 MED ORDER — OXYCODONE-ACETAMINOPHEN 5-325 MG PO TABS
ORAL_TABLET | ORAL | Status: AC
  Filled 2016-05-21: qty 1

## 2016-05-21 MED ORDER — PROMETHAZINE HCL 25 MG/ML IJ SOLN: 6.2500 mg | INTRAMUSCULAR | Status: DC | PRN

## 2016-05-21 MED ORDER — LIDOCAINE 2% (20 MG/ML) 5 ML SYRINGE
INTRAMUSCULAR | Status: AC
  Filled 2016-05-21: qty 5

## 2016-05-21 MED ORDER — SODIUM CHLORIDE 0.9 % IV SOLN
INTRAVENOUS | Status: DC | PRN
  Administered 2016-05-21: 09:00:00 via INTRAVENOUS

## 2016-05-21 MED ORDER — POLYETHYLENE GLYCOL 3350 17 G PO PACK
17.0000 g | PACK | Freq: Two times a day (BID) | ORAL | Status: DC
  Administered 2016-05-21 – 2016-05-30 (×7): 17 g via ORAL
  Filled 2016-05-21 (×16): qty 1

## 2016-05-21 SURGICAL SUPPLY — 28 items
BNDG GAUZE ELAST 4 BULKY (GAUZE/BANDAGES/DRESSINGS) ×9 IMPLANT
CANISTER SUCT 3000ML PPV (MISCELLANEOUS) ×3 IMPLANT
COVER SURGICAL LIGHT HANDLE (MISCELLANEOUS) ×3 IMPLANT
DRAPE LAPAROSCOPIC ABDOMINAL (DRAPES) IMPLANT
DRAPE LAPAROTOMY 100X72 PEDS (DRAPES) IMPLANT
DRAPE UTILITY XL STRL (DRAPES) ×3 IMPLANT
DRSG PAD ABDOMINAL 8X10 ST (GAUZE/BANDAGES/DRESSINGS) ×9 IMPLANT
ELECT CAUTERY BLADE 6.4 (BLADE) ×3 IMPLANT
ELECT REM PT RETURN 9FT ADLT (ELECTROSURGICAL) ×3
ELECTRODE REM PT RTRN 9FT ADLT (ELECTROSURGICAL) ×1 IMPLANT
GAUZE SPONGE 4X4 12PLY STRL (GAUZE/BANDAGES/DRESSINGS) ×6 IMPLANT
GLOVE BIO SURGEON STRL SZ7 (GLOVE) ×9 IMPLANT
GLOVE BIOGEL PI IND STRL 7.5 (GLOVE) ×1 IMPLANT
GLOVE BIOGEL PI INDICATOR 7.5 (GLOVE) ×2
GOWN STRL REUS W/ TWL LRG LVL3 (GOWN DISPOSABLE) ×2 IMPLANT
GOWN STRL REUS W/TWL LRG LVL3 (GOWN DISPOSABLE) ×4
HEMOSTAT SNOW SURGICEL 2X4 (HEMOSTASIS) ×3 IMPLANT
KIT BASIN OR (CUSTOM PROCEDURE TRAY) ×3 IMPLANT
KIT ROOM TURNOVER OR (KITS) ×3 IMPLANT
NS IRRIG 1000ML POUR BTL (IV SOLUTION) ×3 IMPLANT
PACK GENERAL/GYN (CUSTOM PROCEDURE TRAY) IMPLANT
PACK LITHOTOMY IV (CUSTOM PROCEDURE TRAY) ×3 IMPLANT
PAD ARMBOARD 7.5X6 YLW CONV (MISCELLANEOUS) ×3 IMPLANT
SWAB COLLECTION DEVICE MRSA (MISCELLANEOUS) ×3 IMPLANT
SWAB CULTURE ESWAB REG 1ML (MISCELLANEOUS) ×3 IMPLANT
SYR BULB 3OZ (MISCELLANEOUS) ×3 IMPLANT
TOWEL OR 17X24 6PK STRL BLUE (TOWEL DISPOSABLE) ×3 IMPLANT
TOWEL OR 17X26 10 PK STRL BLUE (TOWEL DISPOSABLE) ×3 IMPLANT

## 2016-05-21 NOTE — Anesthesia Procedure Notes (Signed)
Procedure Name: Intubation Date/Time: 05/21/2016 9:16 AM Performed by: Rica Koyanagi Pre-anesthesia Checklist: Patient identified, Emergency Drugs available, Suction available and Patient being monitored Patient Re-evaluated:Patient Re-evaluated prior to inductionOxygen Delivery Method: Circle System Utilized Preoxygenation: Pre-oxygenation with 100% oxygen Intubation Type: IV induction Ventilation: Mask ventilation without difficulty Laryngoscope Size: Mac and 3 Grade View: Grade I Tube type: Oral Tube size: 7.0 mm Number of attempts: 1 Airway Equipment and Method: Stylet Placement Confirmation: ETT inserted through vocal cords under direct vision,  positive ETCO2 and breath sounds checked- equal and bilateral Secured at: 21 cm Tube secured with: Tape Dental Injury: Teeth and Oropharynx as per pre-operative assessment

## 2016-05-21 NOTE — Interval H&P Note (Signed)
History and Physical Interval Note:  05/21/2016 8:59 AM  [name]  has presented today for surgery, with the diagnosis of Soft tissue infection left thigh  The various methods of treatment have been discussed with the patient and family. After consideration of risks, benefits and other options for treatment, the patient has consented to  Procedure(s): DEBRIDEMENT LEFT THIGH WOUND (Left) as a surgical intervention .  The patient's history has been reviewed, patient examined, no change in status, stable for surgery.  I have reviewed the patient's chart and labs.  Questions were answered to the patient's satisfaction.     [name]

## 2016-05-21 NOTE — Progress Notes (Addendum)
Assessment/Plan: 1 ESRD HD MWF, in chair- weight down . 2 HTN,  off meds 3 anemia esa 4 HPTH vit d 5 Severe obesty 6 Immobility 7 Fevers undergoing eval, ?infx v CTD 8 Hx of CVA 9 Soft tissue infx of left thigh, s/p deep debridement today ? Source of fever 10 Serology pos for Scleroderma/ pos ANA ? titer  P  For HD in AM.   Subjective: Interval History: No significant arthralgia history, photosensitivity, alopecia or raynauds  Objective: Vital signs in last 24 hours: Temp:  [97.8 F (36.6 C)-99.4 F (37.4 C)] 98 F (36.7 C) (04/10 1706) Pulse Rate:  [98-109] 100 (04/10 1706) Resp:  [14-26] 17 (04/10 1706) BP: (105-125)/(57-78) 116/73 (04/10 1706) SpO2:  [96 %-100 %] 96 % (04/10 1706) Weight:  [139.3 kg (307 lb)] 139.3 kg (307 lb) (04/10 0521) Weight change: -0.907 kg (-2 lb)  Intake/Output from previous day: 04/09 0701 - 04/10 0700 In: 280 [P.O.:120; IV Piggyback:160] Out: 4000  Intake/Output this shift: No intake/output data recorded.  PE: alert today, Lungs clear   Cor RRR, Abd oft, L thigh bandaged  Lab Results:  Recent Labs  05/20/16 0647 05/21/16 0721  WBC 13.5* 14.3*  HGB 8.7* 9.2*  HCT 27.9* 29.7*  PLT 349 390   BMET:  Recent Labs  05/20/16 0647 05/21/16 0721  NA 133* 134*  K 4.6 4.5  CL 93* 96*  CO2 23 21*  GLUCOSE 185* 198*  BUN 68* 45*  CREATININE 5.63* 4.22*  CALCIUM 9.1 9.2   No results for input(s): PTH in the last 72 hours. Iron Studies: No results for input(s): IRON, TIBC, TRANSFERRIN, FERRITIN in the last 72 hours. Studies/Results: No results found.  Medication reviewed   LOS: 20 days   Hailee Hollick C 05/21/2016,9:35 PM

## 2016-05-21 NOTE — Anesthesia Postprocedure Evaluation (Addendum)
Anesthesia Post Note  Patient: [name]  Procedure(s) Performed: Procedure(s) (LRB): DEBRIDEMENT LEFT THIGH WOUND (Left)  Patient location during evaluation: PACU Anesthesia Type: General Level of consciousness: awake and sedated Pain management: pain level controlled Vital Signs Assessment: post-procedure vital signs reviewed and stable Respiratory status: spontaneous breathing, nonlabored ventilation, respiratory function stable and patient connected to nasal cannula oxygen Cardiovascular status: blood pressure returned to baseline and stable Postop Assessment: no signs of nausea or vomiting Anesthetic complications: no       Last Vitals:  Vitals:   05/21/16 1100 05/21/16 1138  BP: 109/68 (!) 105/57  Pulse: 98 99  Resp: 19 18  Temp: 36.7 C 37.1 C    Last Pain:  Vitals:   05/21/16 1138  TempSrc: Oral  PainSc:                  [name],JAMES TERRILL

## 2016-05-21 NOTE — Progress Notes (Addendum)
PROGRESS NOTE  [name]  ION:[id number] DOB: February 23, 1975 DOA: 05/01/2016 PCP: Alvester Chou, NP  Brief Narrative:  41 y.o.femalewith medical history significant formorbid obesity, chronic kidney disease stage IV-V, diabetes mellitus, hypertension, prior CVA, diastolic CHF who presented to ED with progressive weakness, fever for past 1 week prior to this admission. She is actually bed bound for past year or so per her husband report ever since stroke. She also had dry cough and shortness of breath for past 1 week.  Her creatinine was 7.9 (much worse since her recent Cr values of 4-5).  She was started on hemodialysis and underwent left upper arm AV graft placement on 4/3.  She was anticipating going home with her family but because she is too weak to assist with transfers and is morbidly obese, she would need a hoyer lift, hospital bed, and wheelchair for transport to dialysis.  Unfortunately, she was given a hospital bed last year which was "lost" and no home health agencies in the region will provide her with equipment.  She was told that she will need to go to SNF for [facility] term rehabilitation to regain enough strength to at least transfer from a low bed/mattress to a chair in order to get to HD.  She has been having intermittent fevers without defined source.  Her fevers appeared to get better with cessation of antibiotics, however, they returned.  This may be due to a gangrenous area on the posterior left thigh which was debrided by general surgery on 4/10 in the OR.  As part of her FUO work up, however, she was also found to be ANA positive with a high scl-70 titer.  I speculate that maybe her bilateral leg ulcers, skin gangrene, renal failure, fatigue/weakness, and intermittent fevers may be due to autoimmune disease.  I called [facility] and left a message for a provider to call me back to discuss her case.    Assessment & Plan:   Principal Problem:   Acute metabolic  encephalopathy Active Problems:   Morbid obesity ([place])   Essential hypertension   History of CVA with residual deficit   Anemia of chronic disease   Lymphedema   Debility   Acute pulmonary edema (HCC)   Acute renal failure superimposed on stage 5 chronic kidney disease, not on chronic dialysis ([place])   Acute respiratory failure with hypoxia (HCC)   Right middle lobe pneumonia ([place])   Leukocytosis   Uncontrolled diabetes mellitus with diabetic nephropathy, with long-term current use of insulin (HCC)   Dyslipidemia associated with type 2 diabetes mellitus ([place])   ESRD (end stage renal disease) ([place])   Goals of care, counseling/discussion   Palliative care by specialist  ESRD with acute pulmonary edema initially attributed uncontrolled diabetes and FSG from obesity, however, her diabetes is not longstanding and not always dramatically uncontrolled.  Questioning whether she may have an autoimmune component to her diagnosis.   -  Patient was initially placed on aggressive IV Lasix and metolazone but progressed to ESRD - Tunneled dialysis cath Placed on 3/27. Hemodialysis initiated 3/28, then 3/29, HD 4/1, HD 4/2 - Accepted at [facility]  - Vascular surgery also following, AV graft placement 4/3  Recurrent fever could be due to gangrenous left posterior leg ulcer but she is also ANA positive with high Scleroderma ab titers.  She had coag negative staph from 1/2 blood cultures on 3/21 which was felt to be contaminant.  Other cultures are so far negative.   -  CT scan of chest was reviewed, from 3/22:  No evidence of abscess or source of fever -  Recent CXR and urine culture have been negative -  Repeat blood culture:  NGTD -  CT a/p to eval for abscess:  No obvious source of fever -  HIV negative during this hospitalization -  ESR 4 -  LDH 323 -  ANA positive, reflex positive for scleroderma antibody -  CK 62 - RF 26.1 -  Quantiferon pending -  SPEP pending -   Appreciate wound care assistance -  General surgery consulted > plan for OR debridement -  Resume vancomycin and zosyn pending debridement  Morbid obesity / Debility/ generalized weakness.  Unable to assist with transfers -  No home health agency will provide equipment and services to home because of lost equipment in the past -  SW consulted for SNF -  Nutrition consulted -  Body mass index is 54.58 kg/m. -  Wt has gone down about 70-lbs since admission  Acute respiratory failure with hypoxia with possible right middle lobe pneumonia vs. Atelectasis on CXR.  Persistent fevers, leukocytosis despite more than a week of doxycycline and the addition of ceftaz    Acute metabolic encephalopathy - Improved, at baseline mental status - Likely multifactorial from history of CVA, end stage renal disease, possible uremia, acute infectious process - possible pneumonia  Chest pain episode  -  Troponin 1 was 0.04 however d-dimer was 2.01 - VQ scan Negative for any PE  Anemia of chronic disease / IDA - Status post 2 units PRBC transfusion on 05/02/2016  - Aranesp 3/22, continue iron supplementation, B12  Uncontrolled diabetes mellitus with diabetic nephropathy with long term insulin use, hyperglycemic - continue lantus 10 units - start aspart 3 units TIC AC -  Continue low dose SSI  Dyslipidemia associated with type 2 DM - Continue statin therapy   Essential hypertension, blood pressures low normal despite holding BP medications  Bilateral inner thigh wounds, left leg developed foul odor and area of central gangrene.     - Related to moisture and friction from thighs rubbing together -  Appreciate general surgery assistance, debrided on 4/10  DVT prophylaxis:  heparin Code Status:  full Family Communication:  Patient alone Disposition Plan:   Will need to go to SNF.  Family unable to transfer patient from low bed to chair so she will therefore be unable to go to dialysis from home.     Consultants:   Nephrology  WOC  Palliative care   Nutrition  Vascular surgery   Procedures:  Hemodialysis is initiated 3/28  Antimicrobials:  Anti-infectives    Start     Dose/Rate Route Frequency Ordered Stop   05/22/16 1200  vancomycin (VANCOCIN) 1,250 mg in sodium chloride 0.9 % 250 mL IVPB  Status:  Discontinued     1,250 mg 250 mL/hr over 60 Minutes Intravenous Every M-W-F (Hemodialysis) 05/20/16 1024 05/20/16 1027   05/22/16 1200  vancomycin (VANCOCIN) IVPB 1000 mg/200 mL premix     1,000 mg 200 mL/hr over 60 Minutes Intravenous Every M-W-F (Hemodialysis) 05/20/16 1027     05/20/16 1200  vancomycin (VANCOCIN) 2,250 mg in sodium chloride 0.9 % 250 mL IVPB     2,250 mg 250 mL/hr over 60 Minutes Intravenous Every Mon (Hemodialysis) 05/20/16 1024 05/20/16 1513   05/20/16 1030  piperacillin-tazobactam (ZOSYN) IVPB 3.375 g     3.375 g 12.5 mL/hr over 240 Minutes Intravenous Every 12 hours 05/20/16 1012  05/16/16 1630  cefTRIAXone (ROCEPHIN) 1 g in dextrose 5 % 50 mL IVPB  Status:  Discontinued     1 g 100 mL/hr over 30 Minutes Intravenous Every 24 hours 05/16/16 1625 05/17/16 1641   05/14/16 1430  cefTAZidime (FORTAZ) 1 g in dextrose 5 % 50 mL IVPB  Status:  Discontinued     1 g 100 mL/hr over 30 Minutes Intravenous Every 24 hours 05/14/16 1406 05/15/16 1325   05/14/16 1430  doxycycline (VIBRA-TABS) tablet 100 mg  Status:  Discontinued     100 mg Oral Every 12 hours 05/14/16 1406 05/15/16 1325   05/14/16 0000  ceFAZolin (ANCEF) IVPB 1 g/50 mL premix  Status:  Discontinued    Comments:  Send with pt to OR   1 g 100 mL/hr over 30 Minutes Intravenous On call 05/13/16 1630 05/15/16 1325   05/12/16 1330  cefTAZidime (FORTAZ) 1 g in dextrose 5 % 50 mL IVPB  Status:  Discontinued     1 g 100 mL/hr over 30 Minutes Intravenous Every 24 hours 05/12/16 1319 05/14/16 1406   05/08/16 1000  vancomycin (VANCOCIN) IVPB 1000 mg/200 mL premix     1,000 mg 200 mL/hr over 60  Minutes Intravenous To Surgery 05/07/16 1904 05/08/16 1004   05/08/16 0902  vancomycin (VANCOCIN) 1-5 GM/200ML-% IVPB    Comments:  Vernell Leep, [place]   : cabinet override      05/08/16 0902 05/08/16 0904   05/07/16 1300  ceFAZolin (ANCEF) 3 g in dextrose 5 % 50 mL IVPB     3 g 130 mL/hr over 30 Minutes Intravenous To Radiology 05/07/16 1232 05/07/16 1330   05/02/16 2200  doxycycline (VIBRA-TABS) tablet 100 mg  Status:  Discontinued     100 mg Oral Every 12 hours 05/02/16 1732 05/14/16 1406   05/01/16 2200  piperacillin-tazobactam (ZOSYN) IVPB 2.25 g  Status:  Discontinued     2.25 g 100 mL/hr over 30 Minutes Intravenous Every 8 hours 05/01/16 1324 05/02/16 1729   05/01/16 1330  vancomycin (VANCOCIN) 2,500 mg in sodium chloride 0.9 % 500 mL IVPB     2,500 mg 250 mL/hr over 120 Minutes Intravenous  Once 05/01/16 1309 05/01/16 1551   05/01/16 1300  piperacillin-tazobactam (ZOSYN) IVPB 3.375 g     3.375 g 100 mL/hr over 30 Minutes Intravenous  Once 05/01/16 1258 05/01/16 1340   05/01/16 1300  vancomycin (VANCOCIN) IVPB 1000 mg/200 mL premix  Status:  Discontinued     1,000 mg 200 mL/hr over 60 Minutes Intravenous  Once 05/01/16 1258 05/01/16 1308       Subjective:  Tired.  Denies SOB, cough, vomiting.  Poor appetite.    Objective: Vitals:   05/21/16 1030 05/21/16 1045 05/21/16 1100 05/21/16 1138  BP: 107/70 114/66 109/68 (!) 105/57  Pulse: 99 99 98 99  Resp: (!) 22 (!) '26 19 18  '$ Temp: 97.9 F (36.6 C)  98 F (36.7 C) 98.7 F (37.1 C)  TempSrc:    Oral  SpO2: 99% 100% 99% 97%  Weight:      Height:        Intake/Output Summary (Last 24 hours) at 05/21/16 1352 Last data filed at 05/21/16 1000  Gross per 24 hour  Intake              530 ml  Output             4075 ml  Net            -3545  ml   Filed Weights   05/19/16 0509 05/20/16 0500 05/21/16 0521  Weight: 111.8 kg (246 lb 8 oz) (!) 140.2 kg (309 lb) (!) 139.3 kg (307 lb)    Examination:  General exam:  Adult  female, obese.  Sleepy but arousable.   HEENT:  NCAT, MMM Respiratory system: Clear to auscultation bilaterally Cardiovascular system: Regular rate and rhythm, normal S1/S2. No murmurs, rubs, gallops or clicks.  Warm extremities Gastrointestinal system: Normal active bowel sounds, soft, nondistended, nontender. MSK:  Normal tone and bulk, legs appear wrinkled as if previous swelling has improved.  1+ edema bilateral lower extremities Neuro:  Diffusely weak, decreased muscle tone and bulk Skin:  Posterior thigh not observed today > just came back from debridement in OR.   Data Reviewed: I have personally reviewed following labs and imaging studies  CBC:  Recent Labs Lab 05/15/16 0713 05/17/16 0926 05/19/16 1559 05/20/16 0647 05/21/16 0721  WBC 15.0* 14.9* 13.9* 13.5* 14.3*  NEUTROABS  --   --  10.1*  --   --   HGB 8.5* 7.9* 8.8* 8.7* 9.2*  HCT 26.4* 25.9* 27.9* 27.9* 29.7*  MCV 90.7 88.7 87.7 88.3 88.4  PLT 245 257 356 349 443   Basic Metabolic Panel:  Recent Labs Lab 05/14/16 2113 05/17/16 0926 05/20/16 0647 05/21/16 0721  NA 137 135 133* 134*  K 4.7 3.9 4.6 4.5  CL 101 95* 93* 96*  CO2 '25 25 23 '$ 21*  GLUCOSE 171* 206* 185* 198*  BUN 45* 52* 68* 45*  CREATININE 3.41* 4.38* 5.63* 4.22*  CALCIUM 8.9 9.2 9.1 9.2  PHOS 3.5 4.1 4.9* 5.6*   GFR: Estimated Creatinine Clearance: 25.8 mL/min (A) (by C-G formula based on SCr of 4.22 mg/dL (H)). Liver Function Tests:  Recent Labs Lab 05/14/16 2113 05/17/16 0926 05/20/16 0647 05/21/16 0721  ALBUMIN 1.8* 2.0* 2.0* 2.3*   No results for input(s): LIPASE, AMYLASE in the last 168 hours. No results for input(s): AMMONIA in the last 168 hours. Coagulation Profile: No results for input(s): INR, PROTIME in the last 168 hours. Cardiac Enzymes:  Recent Labs Lab 05/19/16 1559  CKTOTAL 62   BNP (last 3 results) No results for input(s): PROBNP in the last 8760 hours. HbA1C: No results for input(s): HGBA1C in the last 72  hours. CBG:  Recent Labs Lab 05/20/16 1700 05/20/16 2110 05/21/16 0800 05/21/16 1018 05/21/16 1137  GLUCAP 164* 152* 187* 189* 197*   Lipid Profile: No results for input(s): CHOL, HDL, LDLCALC, TRIG, CHOLHDL, LDLDIRECT in the last 72 hours. Thyroid Function Tests: No results for input(s): TSH, T4TOTAL, FREET4, T3FREE, THYROIDAB in the last 72 hours. Anemia Panel: No results for input(s): VITAMINB12, FOLATE, FERRITIN, TIBC, IRON, RETICCTPCT in the last 72 hours. Urine analysis:    Component Value Date/Time   COLORURINE AMBER (A) 05/16/2016 1142   APPEARANCEUR TURBID (A) 05/16/2016 1142   LABSPEC 1.019 05/16/2016 1142   PHURINE 5.0 05/16/2016 1142   GLUCOSEU NEGATIVE 05/16/2016 1142   HGBUR MODERATE (A) 05/16/2016 1142   BILIRUBINUR NEGATIVE 05/16/2016 1142   KETONESUR NEGATIVE 05/16/2016 1142   PROTEINUR 100 (A) 05/16/2016 1142   UROBILINOGEN 0.2 10/26/2013 1721   NITRITE NEGATIVE 05/16/2016 1142   LEUKOCYTESUR MODERATE (A) 05/16/2016 1142   Sepsis Labs: '@LABRCNTIP'$ (procalcitonin:4,lacticidven:4)  ) Recent Results (from the past 240 hour(s))  MRSA PCR Screening     Status: None   Collection Time: 05/13/16 11:26 AM  Result Value Ref Range Status   MRSA by PCR NEGATIVE NEGATIVE Final  Comment:        The GeneXpert MRSA Assay (FDA approved for NASAL specimens only), is one component of a comprehensive MRSA colonization surveillance program. It is not intended to diagnose MRSA infection nor to guide or monitor treatment for MRSA infections.   Culture, blood (Routine X 2) w Reflex to ID Panel     Status: None   Collection Time: 05/15/16  9:03 PM  Result Value Ref Range Status   Specimen Description BLOOD RIGHT HAND  Final   Special Requests   Final    BOTTLES DRAWN AEROBIC ONLY Blood Culture adequate volume   Culture NO GROWTH 6 DAYS  Final   Report Status 05/21/2016 FINAL  Final  Culture, blood (Routine X 2) w Reflex to ID Panel     Status: None   Collection  Time: 05/15/16  9:10 PM  Result Value Ref Range Status   Specimen Description BLOOD RIGHT HAND  Final   Special Requests IN PEDIATRIC BOTTLE Blood Culture adequate volume  Final   Culture NO GROWTH 6 DAYS  Final   Report Status 05/21/2016 FINAL  Final  Culture, Urine     Status: None   Collection Time: 05/16/16 11:42 AM  Result Value Ref Range Status   Specimen Description URINE, RANDOM  Final   Special Requests NONE  Final   Culture NO GROWTH  Final   Report Status 05/17/2016 FINAL  Final  Culture, blood (Routine X 2) w Reflex to ID Panel     Status: None (Preliminary result)   Collection Time: 05/19/16  3:59 PM  Result Value Ref Range Status   Specimen Description BLOOD RIGHT HAND  Final   Special Requests IN PEDIATRIC BOTTLE Blood Culture adequate volume  Final   Culture NO GROWTH 2 DAYS  Final   Report Status PENDING  Incomplete  Culture, blood (Routine X 2) w Reflex to ID Panel     Status: None (Preliminary result)   Collection Time: 05/19/16  3:59 PM  Result Value Ref Range Status   Specimen Description BLOOD LEFT HAND  Final   Special Requests IN PEDIATRIC BOTTLE Blood Culture adequate volume  Final   Culture NO GROWTH 2 DAYS  Final   Report Status PENDING  Incomplete  Surgical pcr screen     Status: None   Collection Time: 05/21/16  1:27 AM  Result Value Ref Range Status   MRSA, PCR NEGATIVE NEGATIVE Final   Staphylococcus aureus NEGATIVE NEGATIVE Final    Comment:        The Xpert SA Assay (FDA approved for NASAL specimens in patients over 71 years of age), is one component of a comprehensive surveillance program.  Test performance has been validated by [facility] for patients greater than or equal to 38 year old. It is not intended to diagnose infection nor to guide or monitor treatment.       Radiology Studies: Ct Abdomen Pelvis Wo Contrast  Result Date: 05/20/2016 CLINICAL DATA:  41 y/o  F; fever of unknown origin. EXAM: CT ABDOMEN AND PELVIS WITHOUT  CONTRAST TECHNIQUE: Multidetector CT imaging of the abdomen and pelvis was performed following the standard protocol without IV contrast. COMPARISON:  None. FINDINGS: Lower chest: No acute abnormality. Hepatobiliary: No focal liver abnormality is seen. No gallstones, gallbladder wall thickening, or biliary dilatation. Pancreas: Unremarkable. No pancreatic ductal dilatation or surrounding inflammatory changes. Spleen: Normal in size without focal abnormality. Adrenals/Urinary Tract: Adrenal glands are unremarkable. Kidneys are normal, without renal calculi, focal lesion,  or hydronephrosis. Bladder is unremarkable. Stomach/Bowel: Stomach is within normal limits. Appendix appears normal. No evidence of bowel wall thickening, distention, or inflammatory changes. Vascular/Lymphatic: Aortic atherosclerosis. No enlarged abdominal or pelvic lymph nodes. Reproductive: Uterus and bilateral adnexa are unremarkable. Other: No abdominal wall hernia or abnormality. No abdominopelvic ascites. Musculoskeletal: Partially visualized is apparent fat stranding with skin laceration involving the left medial arm probably related to recent placement of AV fistula. Within the field of view no discrete fluid collection is identified. No acute osseous abnormality identified. Lumbar spondylosis predominantly at the L5-S1 level. IMPRESSION: 1. No acute process identified in the abdomen or pelvis as explanation for fever. 2. Air and fat stranding within the left medial arm likely representing postsurgical changes related to recent placement of AV fistula. Within the field of view no discrete fluid collection is identified. Electronically Signed   By: Kristine Garbe M.D.   On: 05/20/2016 00:01     Scheduled Meds: . albuterol  3 mL Inhalation Q M,W,F-HD  . atorvastatin  20 mg Oral Daily  . bisacodyl  10 mg Rectal Once  . darbepoetin (ARANESP) injection - DIALYSIS  150 mcg Intravenous Q Fri-HD  . feeding supplement (NEPRO CARB  STEADY)  237 mL Oral TID BM  . ferric gluconate (FERRLECIT/NULECIT) IV  125 mg Intravenous Q M,W,F-HD  . gi cocktail  30 mL Oral Once  . heparin  5,000 Units Subcutaneous Q8H  . insulin aspart  0-5 Units Subcutaneous QHS  . insulin aspart  0-9 Units Subcutaneous TID WC  . insulin aspart  3 Units Subcutaneous TID WC  . insulin glargine  10 Units Subcutaneous QHS  . mouth rinse  15 mL Mouth Rinse BID  . multivitamin  1 tablet Oral QHS  . piperacillin-tazobactam (ZOSYN)  IV  3.375 g Intravenous Q12H  . polyethylene glycol  17 g Oral BID  . senna-docusate  1 tablet Oral BID  . sevelamer carbonate  800 mg Oral TID WC  . [START ON 05/22/2016] vancomycin  1,000 mg Intravenous Q M,W,F-HD   Continuous Infusions:   LOS: 20 days    Time spent: 30 min    Linda Canterbury, MD [facility] Pager [phone]  If 7PM-7AM, please contact night-coverage [email or web address] Password [email or web address] 05/21/2016, 1:52 PM

## 2016-05-21 NOTE — Procedures (Deleted)
Pt was seen on HD and hemodynamiclly stable. Eleanore Junio C

## 2016-05-21 NOTE — Op Note (Signed)
Preoperative diagnosis: left thigh soft tissue infection Postoperative diagnosis: saa Procedure: excision of 15x6x2.5 cm area of skin and sub tissue Surgeon: Dr Harden Mo Anesthesia general ebl minimal Complications none Drains none Specimens skin/sub tissue to pathology  dispo to recovery  Indications: this is a chronically ill bedbound 40 yof with soft tissue infection of left thigh.  I discussed with patient going to OR for debridement under anesthesia.    1.Progress note or procedure note with a detailed description of the procedure.(see below and above)  2. Tool used for debridement (curette, scapel, etc.) knife, cutting cautery, coagulation cautery.  3. Frequency of surgical debridement. Initial debridement  4. Measurement of total devitalized tissue (wound surface) before and after surgical debridement.  Prior to debridement, wound surface was 14x6 Following debridement the wound was 5 cm deep x 16x6   5. Area and depth of devitalized tissue removed from wound. 15x6x2.5  6. Blood loss and description of tissue removed. Estimated blood loss 50 cc nonviable tissue  7. Evidence of the progress of the wound's response to treatment. A. Current wound volume (current dimensions and depth). Same as above B. Presence (and extent of) of infection. necrotic tissue C. Presence (and extent of) of non viable tissue. All nonviable tissue debrided D. Other material in the wound that is expected to inhibit healing. N/A  8. Was there any viable tissue removed (measurements): Minimal  Procedure note: After informed consent obtained patient taken to or. She was already on antibiotics.  She was placed under general anesthesia without complication. She was placed in lithotomy position. She was prepped and draped in standard fashion.  Timeout was performed.  Using knife and cautery I debrided all of  the tissue described above. I took the dissection  down to healthy bleeding tissue in all areas. I did not see any residual necrotic tissue. The wound was irrigated. It was hemostatic. There was packed with saline Kerlix, ABDs pads and tape. She tolerated this well

## 2016-05-21 NOTE — Progress Notes (Signed)
OT Cancellation Note  Patient Details Name: [name] MRN: [id number] DOB: 07-18-1975   Cancelled Treatment:     Pt. Was very lethargic and was not able to participate today. Pt. Nurse stated she has been very lethargic today.  Rowynn Mcweeney 05/21/2016, 3:46 PM

## 2016-05-21 NOTE — Transfer of Care (Signed)
Immediate Anesthesia Transfer of Care Note  Patient: [name]  Procedure(s) Performed: Procedure(s): DEBRIDEMENT LEFT THIGH WOUND (Left)  Patient Location: PACU  Anesthesia Type:General  Level of Consciousness: lethargic and responds to stimulation  Airway & Oxygen Therapy: Patient Spontanous Breathing and Patient connected to nasal cannula oxygen  Post-op Assessment: Report given to RN  Post vital signs: Reviewed and stable  Last Vitals:  Vitals:   05/21/16 0527 05/21/16 0744  BP: 120/78 125/73  Pulse: (!) 107 (!) 109  Resp: 14 18  Temp: 37.4 C 37.4 C    Last Pain:  Vitals:   05/21/16 0744  TempSrc: Oral  PainSc:       Patients Stated Pain Goal: 0 (05/19/16 1745)  Complications: No apparent anesthesia complications

## 2016-05-21 NOTE — H&P (View-Only) (Signed)
Reason for Consult:Gangrenous thigh Referring Physician: M Short  [name] is an 41 y.o. female.  HPI: Morbidly obese woman admitted on 05/01/16 with weakness and fever.  She has multiple medical issues as listed below.  She was seen by wound care today with Moisture associated skin issues.  Skin care nurse notes show:   Left thigh 10cm x 9cm x 0.1cm  Right thigh 4cm x 3.5cm x 0.1cm  Wound bed: Left thigh 80% black, soft/20% yellow at wound perimeter Right thigh; 90% pink, moist, 10% yellow/brown crust Drainage (amount, consistency, odor) scant, slight odor Periwound: brawny edema of the right lower leg, induration note in the left thigh circumferentially.  We are ask to see.     Past Medical History:  Diagnosis Date  . Anasarca 02/05/2015  . Asthma   . CHF (congestive heart failure) (HCC)    diastolic  . CVA (cerebral infarction) 1990's   "writing is not the same since" R leg weakness   . GERD (gastroesophageal reflux disease)   . Hypertension   . Morbid obesity ([place])   . Palliative care encounter   . Stroke ([place])   . Type II diabetes mellitus ([place])     Past Surgical History:  Procedure Laterality Date  . AV FISTULA PLACEMENT Left 05/14/2016   Procedure: ARTERIOVENOUS (AV) LEFT UPPER ARM USING GORETEX STRETCHED VASCULAR GRAFT;  Surgeon: Angelia Mould, MD;  Location: [place];  Service: Vascular;  Laterality: Left;  . INCISION AND DRAINAGE OF WOUND Left 08/30/2013   thigh necrotic wound/notes 08/30/2013  . IR GENERIC HISTORICAL  05/07/2016   IR FLUORO GUIDE CV LINE RIGHT 05/07/2016 Markus Daft, MD [facility]  . IR GENERIC HISTORICAL  05/07/2016   IR US GUIDE VASC ACCESS RIGHT 05/07/2016 Markus Daft, MD [facility]  . IRRIGATION AND DEBRIDEMENT ABSCESS Left 08/30/2013   Procedure: IRRIGATION AND DEBRIDEMENT ABSCESS Left Thigh Necrotic Wound;  Surgeon: Ralene Ok, MD;  Location: [place] [facility];  Service: General;  Laterality: Left;  . TEE WITHOUT CARDIOVERSION N/A  09/07/2013   Procedure: TRANSESOPHAGEAL ECHOCARDIOGRAM (TEE);  Surgeon: Pixie Casino, MD;  Location: [facility] ENDOSCOPY;  Service: Cardiovascular;  Laterality: N/A;  . TEE WITHOUT CARDIOVERSION N/A 02/08/2015   Procedure: TRANSESOPHAGEAL ECHOCARDIOGRAM (TEE);  Surgeon: Skeet Latch, MD;  Location: [facility] ENDOSCOPY;  Service: Cardiovascular;  Laterality: N/A;    Family History  Problem Relation Age of Onset  . Diabetes Mother   . Kidney disease Mother     Social History:  reports that she has never smoked. She has never used smokeless tobacco. She reports that she does not drink alcohol or use drugs.  Allergies:  Allergies  Allergen Reactions  . Azithromycin Other (See Comments)    Nose bleeding event    Medications:  Prior to Admission:  Prescriptions Prior to Admission  Medication Sig Dispense Refill Last Dose  . albuterol (PROAIR HFA) 108 (90 Base) MCG/ACT inhaler Inhale 2 puffs into the lungs every 6 (six) hours as needed for wheezing or shortness of breath. 1 Inhaler 0 04/30/2016 at Unknown time  . albuterol (PROVENTIL) (2.5 MG/3ML) 0.083% nebulizer solution Take 3 mLs (2.5 mg total) by nebulization every 6 (six) hours as needed for wheezing or shortness of breath. 75 mL 0 04/30/2016 at Unknown time  . amLODipine (NORVASC) 10 MG tablet Take 1 tablet (10 mg total) by mouth daily. 30 tablet 0 04/30/2016 at Unknown time  . atorvastatin (LIPITOR) 20 MG tablet Take 20 mg by mouth daily.   04/30/2016  at Unknown time  . carvedilol (COREG) 25 MG tablet Take 1 tablet (25 mg total) by mouth 2 (two) times daily with a meal. 60 tablet 1 04/30/2016 at 1900  . ferrous sulfate 325 (65 FE) MG tablet Take 1 tablet (325 mg total) by mouth 3 (three) times daily with meals. 90 tablet 3 04/30/2016 at Unknown time  . furosemide (LASIX) 80 MG tablet Take 2 tablets (160 mg total) by mouth every 6 (six) hours. 120 tablet 0 04/30/2016 at Unknown time  . hydrALAZINE (APRESOLINE) 50 MG tablet Take 2 tablets (100 mg  total) by mouth 3 (three) times daily. 90 tablet 0 04/30/2016 at Unknown time  . insulin aspart (NOVOLOG) 100 UNIT/ML injection Inject 0-20 Units into the skin 3 (three) times daily with meals. CBG < 70: implement hypoglycemia protocol CBG 70 - 120: 0 units CBG 121 - 150: 3 units CBG 151 - 200: 4 units CBG 201 - 250: 7 units CBG 251 - 300: 11 units CBG 301 - 350: 15 units CBG 351 - 400: 20 units 10 mL 0 Past Week at Unknown time  . isosorbide mononitrate (IMDUR) 30 MG 24 hr tablet Take 1 tablet (30 mg total) by mouth daily. 30 tablet 3 Past Week at Unknown time  . metolazone (ZAROXOLYN) 10 MG tablet Take 1 tablet (10 mg total) by mouth daily. 30 tablet 0 04/30/2016 at Unknown time  . polyethylene glycol powder (GLYCOLAX/MIRALAX) powder Dissolve 1 capful into 4 to 8 oz of fluid  4 04/30/2016 at Unknown time  . potassium chloride SA (K-DUR,KLOR-CON) 20 MEQ tablet Take 2 tablets (40 mEq total) by mouth daily. 30 tablet 0 04/30/2016 at Unknown time  . vitamin A 8000 UNIT capsule Take 8,000 Units by mouth daily.   04/30/2016 at Unknown time  . camphor-menthol (SARNA) lotion Apply 1 application topically every 8 (eight) hours as needed for itching. 222 mL 0 unknown   Scheduled: . albuterol  3 mL Inhalation Q M,W,F-HD  . atorvastatin  20 mg Oral Daily  . calcium acetate  667 mg Oral TID WC  . darbepoetin (ARANESP) injection - DIALYSIS  150 mcg Intravenous Q Fri-HD  . feeding supplement (NEPRO CARB STEADY)  237 mL Oral TID BM  . ferric gluconate (FERRLECIT/NULECIT) IV  125 mg Intravenous Q M,W,F-HD  . gi cocktail  30 mL Oral Once  . heparin  40 Units/kg Dialysis Once in dialysis  . heparin  5,000 Units Subcutaneous Q8H  . insulin aspart  0-5 Units Subcutaneous QHS  . insulin aspart  0-9 Units Subcutaneous TID WC  . insulin glargine  10 Units Subcutaneous QHS  . multivitamin  1 tablet Oral QHS  . piperacillin-tazobactam (ZOSYN)  IV  3.375 g Intravenous Q12H  . polyethylene glycol  17 g Oral Daily    . senna-docusate  1 tablet Oral BID  . vancomycin  2,250 mg Intravenous Q Mon-HD  . [START ON 05/22/2016] vancomycin  1,000 mg Intravenous Q M,W,F-HD   Continuous:  EXN:TZGYFV chloride, sodium chloride, acetaminophen, albuterol, alteplase, camphor-menthol, guaiFENesin-dextromethorphan, heparin, HYDROcodone-acetaminophen, lidocaine (PF), lidocaine-prilocaine, morphine injection, nitroGLYCERIN, ondansetron **OR** ondansetron (ZOFRAN) IV, oxyCODONE-acetaminophen, pentafluoroprop-tetrafluoroeth Anti-infectives    Start     Dose/Rate Route Frequency Ordered Stop   05/22/16 1200  vancomycin (VANCOCIN) 1,250 mg in sodium chloride 0.9 % 250 mL IVPB  Status:  Discontinued     1,250 mg 250 mL/hr over 60 Minutes Intravenous Every M-W-F (Hemodialysis) 05/20/16 1024 05/20/16 1027   05/22/16 1200  vancomycin (VANCOCIN) IVPB 1000  mg/200 mL premix     1,000 mg 200 mL/hr over 60 Minutes Intravenous Every M-W-F (Hemodialysis) 05/20/16 1027     05/20/16 1200  vancomycin (VANCOCIN) 2,250 mg in sodium chloride 0.9 % 250 mL IVPB     2,250 mg 250 mL/hr over 60 Minutes Intravenous Every Mon (Hemodialysis) 05/20/16 1024 05/27/16 1159   05/20/16 1030  piperacillin-tazobactam (ZOSYN) IVPB 3.375 g     3.375 g 12.5 mL/hr over 240 Minutes Intravenous Every 12 hours 05/20/16 1012     05/16/16 1630  cefTRIAXone (ROCEPHIN) 1 g in dextrose 5 % 50 mL IVPB  Status:  Discontinued     1 g 100 mL/hr over 30 Minutes Intravenous Every 24 hours 05/16/16 1625 05/17/16 1641   05/14/16 1430  cefTAZidime (FORTAZ) 1 g in dextrose 5 % 50 mL IVPB  Status:  Discontinued     1 g 100 mL/hr over 30 Minutes Intravenous Every 24 hours 05/14/16 1406 05/15/16 1325   05/14/16 1430  doxycycline (VIBRA-TABS) tablet 100 mg  Status:  Discontinued     100 mg Oral Every 12 hours 05/14/16 1406 05/15/16 1325   05/14/16 0000  ceFAZolin (ANCEF) IVPB 1 g/50 mL premix  Status:  Discontinued    Comments:  Send with pt to OR   1 g 100 mL/hr over 30  Minutes Intravenous On call 05/13/16 1630 05/15/16 1325   05/12/16 1330  cefTAZidime (FORTAZ) 1 g in dextrose 5 % 50 mL IVPB  Status:  Discontinued     1 g 100 mL/hr over 30 Minutes Intravenous Every 24 hours 05/12/16 1319 05/14/16 1406   05/08/16 1000  vancomycin (VANCOCIN) IVPB 1000 mg/200 mL premix     1,000 mg 200 mL/hr over 60 Minutes Intravenous To Surgery 05/07/16 1904 05/08/16 1004   05/08/16 0902  vancomycin (VANCOCIN) 1-5 GM/200ML-% IVPB    Comments:  Vernell Leep, [place]   : cabinet override      05/08/16 0902 05/08/16 0904   05/07/16 1300  ceFAZolin (ANCEF) 3 g in dextrose 5 % 50 mL IVPB     3 g 130 mL/hr over 30 Minutes Intravenous To Radiology 05/07/16 1232 05/07/16 1330   05/02/16 2200  doxycycline (VIBRA-TABS) tablet 100 mg  Status:  Discontinued     100 mg Oral Every 12 hours 05/02/16 1732 05/14/16 1406   05/01/16 2200  piperacillin-tazobactam (ZOSYN) IVPB 2.25 g  Status:  Discontinued     2.25 g 100 mL/hr over 30 Minutes Intravenous Every 8 hours 05/01/16 1324 05/02/16 1729   05/01/16 1330  vancomycin (VANCOCIN) 2,500 mg in sodium chloride 0.9 % 500 mL IVPB     2,500 mg 250 mL/hr over 120 Minutes Intravenous  Once 05/01/16 1309 05/01/16 1551   05/01/16 1300  piperacillin-tazobactam (ZOSYN) IVPB 3.375 g     3.375 g 100 mL/hr over 30 Minutes Intravenous  Once 05/01/16 1258 05/01/16 1340   05/01/16 1300  vancomycin (VANCOCIN) IVPB 1000 mg/200 mL premix  Status:  Discontinued     1,000 mg 200 mL/hr over 60 Minutes Intravenous  Once 05/01/16 1258 05/01/16 1308      Results for orders placed or performed during the hospital encounter of 05/01/16 (from the past 48 hour(s))  Glucose, capillary     Status: Abnormal   Collection Time: 05/18/16 11:57 AM  Result Value Ref Range   Glucose-Capillary 177 (H) 65 - 99 mg/dL   Comment 1 Notify RN   Glucose, capillary     Status: Abnormal   Collection Time:  05/18/16  5:10 PM  Result Value Ref Range   Glucose-Capillary 184 (H) 65 -  99 mg/dL   Comment 1 Notify RN   Glucose, capillary     Status: Abnormal   Collection Time: 05/18/16  9:22 PM  Result Value Ref Range   Glucose-Capillary 194 (H) 65 - 99 mg/dL  Glucose, capillary     Status: Abnormal   Collection Time: 05/19/16  8:19 AM  Result Value Ref Range   Glucose-Capillary 200 (H) 65 - 99 mg/dL  Glucose, capillary     Status: Abnormal   Collection Time: 05/19/16 12:08 PM  Result Value Ref Range   Glucose-Capillary 210 (H) 65 - 99 mg/dL  Sedimentation rate     Status: None   Collection Time: 05/19/16  3:59 PM  Result Value Ref Range   Sed Rate 4 0 - 22 mm/hr  Lactate dehydrogenase     Status: Abnormal   Collection Time: 05/19/16  3:59 PM  Result Value Ref Range   LDH 323 (H) 98 - 192 U/L  CK     Status: None   Collection Time: 05/19/16  3:59 PM  Result Value Ref Range   Total CK 62 38 - 234 U/L  CBC with Differential/Platelet     Status: Abnormal   Collection Time: 05/19/16  3:59 PM  Result Value Ref Range   WBC 13.9 (H) 4.0 - 10.5 K/uL   RBC 3.18 (L) 3.87 - 5.11 MIL/uL   Hemoglobin 8.8 (L) 12.0 - 15.0 g/dL   HCT 27.9 (L) 36.0 - 46.0 %   MCV 87.7 78.0 - 100.0 fL   MCH 27.7 26.0 - 34.0 pg   MCHC 31.5 30.0 - 36.0 g/dL   RDW 15.1 11.5 - 15.5 %   Platelets 356 150 - 400 K/uL   Neutrophils Relative % 72 %   Lymphocytes Relative 14 %   Monocytes Relative 13 %   Eosinophils Relative 1 %   Basophils Relative 0 %   Neutro Abs 10.1 (H) 1.7 - 7.7 K/uL   Lymphs Abs 1.9 0.7 - 4.0 K/uL   Monocytes Absolute 1.8 (H) 0.1 - 1.0 K/uL   Eosinophils Absolute 0.1 0.0 - 0.7 K/uL   Basophils Absolute 0.0 0.0 - 0.1 K/uL   WBC Morphology SMUDGE CELLS    Smear Review LARGE PLATELETS PRESENT   Glucose, capillary     Status: Abnormal   Collection Time: 05/19/16  5:33 PM  Result Value Ref Range   Glucose-Capillary 222 (H) 65 - 99 mg/dL  Glucose, capillary     Status: Abnormal   Collection Time: 05/19/16  9:40 PM  Result Value Ref Range   Glucose-Capillary 205 (H) 65  - 99 mg/dL  CBC     Status: Abnormal   Collection Time: 05/20/16  6:47 AM  Result Value Ref Range   WBC 13.5 (H) 4.0 - 10.5 K/uL   RBC 3.16 (L) 3.87 - 5.11 MIL/uL   Hemoglobin 8.7 (L) 12.0 - 15.0 g/dL   HCT 27.9 (L) 36.0 - 46.0 %   MCV 88.3 78.0 - 100.0 fL   MCH 27.5 26.0 - 34.0 pg   MCHC 31.2 30.0 - 36.0 g/dL   RDW 15.6 (H) 11.5 - 15.5 %   Platelets 349 150 - 400 K/uL  Renal function panel     Status: Abnormal   Collection Time: 05/20/16  6:47 AM  Result Value Ref Range   Sodium 133 (L) 135 - 145 mmol/L   Potassium 4.6 3.5 -  5.1 mmol/L   Chloride 93 (L) 101 - 111 mmol/L   CO2 23 22 - 32 mmol/L   Glucose, Bld 185 (H) 65 - 99 mg/dL   BUN 68 (H) 6 - 20 mg/dL   Creatinine, Ser 5.63 (H) 0.44 - 1.00 mg/dL   Calcium 9.1 8.9 - 10.3 mg/dL   Phosphorus 4.9 (H) 2.5 - 4.6 mg/dL   Albumin 2.0 (L) 3.5 - 5.0 g/dL   GFR calc non Af Amer 9 (L) >60 mL/min   GFR calc Af Amer 10 (L) >60 mL/min    Comment: (NOTE) The eGFR has been calculated using the CKD EPI equation. This calculation has not been validated in all clinical situations. eGFR's persistently <60 mL/min signify possible Chronic Kidney Disease.    Anion gap 17 (H) 5 - 15  Glucose, capillary     Status: Abnormal   Collection Time: 05/20/16  7:55 AM  Result Value Ref Range   Glucose-Capillary 171 (H) 65 - 99 mg/dL    Ct Abdomen Pelvis Wo Contrast  Result Date: 05/20/2016 CLINICAL DATA:  41 y/o  F; fever of unknown origin. EXAM: CT ABDOMEN AND PELVIS WITHOUT CONTRAST TECHNIQUE: Multidetector CT imaging of the abdomen and pelvis was performed following the standard protocol without IV contrast. COMPARISON:  None. FINDINGS: Lower chest: No acute abnormality. Hepatobiliary: No focal liver abnormality is seen. No gallstones, gallbladder wall thickening, or biliary dilatation. Pancreas: Unremarkable. No pancreatic ductal dilatation or surrounding inflammatory changes. Spleen: Normal in size without focal abnormality. Adrenals/Urinary Tract:  Adrenal glands are unremarkable. Kidneys are normal, without renal calculi, focal lesion, or hydronephrosis. Bladder is unremarkable. Stomach/Bowel: Stomach is within normal limits. Appendix appears normal. No evidence of bowel wall thickening, distention, or inflammatory changes. Vascular/Lymphatic: Aortic atherosclerosis. No enlarged abdominal or pelvic lymph nodes. Reproductive: Uterus and bilateral adnexa are unremarkable. Other: No abdominal wall hernia or abnormality. No abdominopelvic ascites. Musculoskeletal: Partially visualized is apparent fat stranding with skin laceration involving the left medial arm probably related to recent placement of AV fistula. Within the field of view no discrete fluid collection is identified. No acute osseous abnormality identified. Lumbar spondylosis predominantly at the L5-S1 level. IMPRESSION: 1. No acute process identified in the abdomen or pelvis as explanation for fever. 2. Air and fat stranding within the left medial arm likely representing postsurgical changes related to recent placement of AV fistula. Within the field of view no discrete fluid collection is identified. Electronically Signed   By: Kristine Garbe M.D.   On: 05/20/2016 00:01    Review of Systems  Constitutional: Positive for fever (intermittent low grade) and weight loss. Negative for chills, diaphoresis and malaise/fatigue.  HENT: Negative.   Eyes: Negative.   Respiratory: Positive for shortness of breath. Negative for cough.        Recurrent issues with hypoxia  Cardiovascular: Negative.   Gastrointestinal: Negative.   Genitourinary:       On Dialysis, does not make any urine.  Musculoskeletal:       Bed ridden for a year, up to chair only.  She does not walk.  Skin:       See notes and picture below.   Neurological: Negative.  Negative for weakness.  Endo/Heme/Allergies: Bruises/bleeds easily.  Psychiatric/Behavioral: Positive for depression.   Blood pressure 129/74,  pulse 100, temperature 98.7 F (37.1 C), temperature source Oral, resp. rate 16, height _0  (1.676 m), weight (!) 140.2 kg (309 lb), last menstrual period 04/11/2016, SpO2 95 %. Physical Exam  Constitutional:  She is oriented to person, place, and time. No distress.  Morbidly obese woman bed ridden for a year.  Alert and oriented just going on HD.  HENT:  Head: Normocephalic and atraumatic.  Mouth/Throat: No oropharyngeal exudate.  Eyes: Right eye exhibits no discharge. Left eye exhibits no discharge. No scleral icterus.  Pupils are equal  Neck: Neck supple. JVD present. No tracheal deviation present. No thyromegaly present.  Old IJ HD site right neck  Cardiovascular: Normal rate, regular rhythm and normal heart sounds.   No murmur heard. Respiratory: Effort normal and breath sounds normal. No respiratory distress. She has no wheezes. She has no rales. She exhibits no tenderness.  GI: Soft. Bowel sounds are normal. She exhibits no distension and no mass. There is no tenderness. There is no rebound and no guarding.  She is up in the chair so it is hard to examine  Musculoskeletal: She exhibits edema and tenderness.  Lymphadenopathy:    She has no cervical adenopathy.  Neurological: She is alert and oriented to person, place, and time. No cranial nerve deficit.  Skin: Skin is warm and dry. She is not diaphoretic. There is erythema.   Left thigh 10cm x 9cm x 0.1cm  Right thigh 4cm x 3.5cm x 0.1cm  Wound bed: Left thigh 80% black, soft/20% yellow at wound perimeter Right thigh; 90% pink, moist, 10% yellow/brown crust Drainage (amount, consistency, odor) scant, slight odor Periwound: brawny edema of the right lower leg, induration note in the left thigh circumferentially.   Psychiatric: She has a normal mood and affect. Her behavior is normal. Judgment and thought content normal.      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 30 kg down but still edema. Pulmonary edema/Acute respiratory failure - now resolved on room air  HTN  AODM with poor control - diabetic neuropathy  anemia  HPTH vit d  Massive obesty  Immobility Fevers undergoing eval  Anxiety She is currently on Vancomycin and Zosyn for fever.    Plan:  I think we can do local wound care to the right thigh and this should clean up.  On the left she has full skin thickness necrosis with odor and drainage.  I think this needs to be debrided in the OR.  I don't think it will heal, but I don't think we can leave it either.  I will pre op her for debridement of both medial thighs, but I think we will be most concerned with the left.    Savahanna Almendariz 05/20/2016, 11:23 AM

## 2016-05-21 NOTE — Anesthesia Preprocedure Evaluation (Addendum)
Anesthesia Evaluation  Patient identified by MRN, date of birth, ID band Patient awake  General Assessment Comment:Debilitated and multiple med problems  Reviewed: Allergy & Precautions, NPO status , Patient's Chart, lab work & pertinent test results  Airway Mallampati: II   Neck ROM: Full    Dental  (+) Edentulous Upper   Pulmonary asthma ,     + decreased breath sounds      Cardiovascular hypertension, +CHF   Rhythm:Regular Rate:Normal     Neuro/Psych    GI/Hepatic GERD  ,  Endo/Other  diabetes  Renal/GU Renal InsufficiencyRenal disease     Musculoskeletal   Abdominal (+) + obese,   Peds  Hematology  (+) anemia ,   Anesthesia Other Findings   Reproductive/Obstetrics                            Anesthesia Physical Anesthesia Plan  ASA: IV  Anesthesia Plan: General   Post-op Pain Management:    Induction: Intravenous  Airway Management Planned: Oral ETT  Additional Equipment:   Intra-op Plan:   Post-operative Plan:   Informed Consent: I have reviewed the patients History and Physical, chart, labs and discussed the procedure including the risks, benefits and alternatives for the proposed anesthesia with the patient or authorized representative who has indicated his/her understanding and acceptance.     Plan Discussed with:   Anesthesia Plan Comments:         Anesthesia Quick Evaluation

## 2016-05-22 ENCOUNTER — Encounter (HOSPITAL_COMMUNITY): Payer: Self-pay | Admitting: General Surgery

## 2016-05-22 DIAGNOSIS — Z515 Encounter for palliative care: Secondary | ICD-10-CM

## 2016-05-22 DIAGNOSIS — G9341 Metabolic encephalopathy: Secondary | ICD-10-CM

## 2016-05-22 LAB — RENAL FUNCTION PANEL
ALBUMIN: 2 g/dL — AB (ref 3.5–5.0)
Anion gap: 19 — ABNORMAL HIGH (ref 5–15)
BUN: 65 mg/dL — AB (ref 6–20)
CALCIUM: 9.3 mg/dL (ref 8.9–10.3)
CO2: 24 mmol/L (ref 22–32)
CREATININE: 5.03 mg/dL — AB (ref 0.44–1.00)
Chloride: 93 mmol/L — ABNORMAL LOW (ref 101–111)
GFR calc Af Amer: 12 mL/min — ABNORMAL LOW (ref >60)
GFR, EST NON AFRICAN AMERICAN: 10 mL/min — AB (ref >60)
Glucose, Bld: 200 mg/dL — ABNORMAL HIGH (ref 65–99)
PHOSPHORUS: 6.3 mg/dL — AB (ref 2.5–4.6)
POTASSIUM: 4.2 mmol/L (ref 3.5–5.1)
SODIUM: 136 mmol/L (ref 135–145)

## 2016-05-22 LAB — C4 COMPLEMENT: COMPLEMENT C4, BODY FLUID: 49 mg/dL — AB (ref 14–44)

## 2016-05-22 LAB — CBC
HCT: 27.8 % — ABNORMAL LOW (ref 36.0–46.0)
Hemoglobin: 8.4 g/dL — ABNORMAL LOW (ref 12.0–15.0)
MCH: 26.8 pg (ref 26.0–34.0)
MCHC: 30.2 g/dL (ref 30.0–36.0)
MCV: 88.8 fL (ref 78.0–100.0)
PLATELETS: 427 10*3/uL — AB (ref 150–400)
RBC: 3.13 MIL/uL — ABNORMAL LOW (ref 3.87–5.11)
RDW: 16.3 % — ABNORMAL HIGH (ref 11.5–15.5)
WBC: 13.7 10*3/uL — AB (ref 4.0–10.5)

## 2016-05-22 LAB — QUANTIFERON IN TUBE
QFT TB AG MINUS NIL VALUE: <0 IU/mL
QUANTIFERON MITOGEN VALUE: 0.18 IU/mL
QUANTIFERON TB AG VALUE: 0.04 [IU]/mL
QUANTIFERON TB GOLD: UNDETERMINED
Quantiferon Nil Value: 0.08 IU/mL

## 2016-05-22 LAB — GLUCOSE, CAPILLARY
GLUCOSE-CAPILLARY: 123 mg/dL — AB (ref 65–99)
Glucose-Capillary: 195 mg/dL — ABNORMAL HIGH (ref 65–99)
Glucose-Capillary: 225 mg/dL — ABNORMAL HIGH (ref 65–99)
Glucose-Capillary: 132 mg/dL — ABNORMAL HIGH (ref 65–99)

## 2016-05-22 LAB — QUANTIFERON TB GOLD ASSAY (BLOOD)

## 2016-05-22 LAB — C3 COMPLEMENT: C3 COMPLEMENT: 152 mg/dL (ref 82–167)

## 2016-05-22 MED ORDER — PENTAFLUOROPROP-TETRAFLUOROETH EX AERO: 1.0000 "application " | INHALATION_SPRAY | CUTANEOUS | Status: DC | PRN

## 2016-05-22 MED ORDER — SODIUM CHLORIDE 0.9 % IV SOLN: 100.0000 mL | INTRAVENOUS | Status: DC | PRN

## 2016-05-22 MED ORDER — HEPARIN SODIUM (PORCINE) 1000 UNIT/ML DIALYSIS: 20.0000 [IU]/kg | INTRAMUSCULAR | Status: DC | PRN

## 2016-05-22 MED ORDER — VANCOMYCIN HCL IN DEXTROSE 1-5 GM/200ML-% IV SOLN
INTRAVENOUS | Status: AC
  Administered 2016-05-22: 1000 mg via INTRAVENOUS
  Filled 2016-05-22: qty 200

## 2016-05-22 MED ORDER — HEPARIN SODIUM (PORCINE) 1000 UNIT/ML DIALYSIS: 1000.0000 [IU] | INTRAMUSCULAR | Status: DC | PRN

## 2016-05-22 MED ORDER — LIDOCAINE HCL (PF) 1 % IJ SOLN: 5.0000 mL | INTRAMUSCULAR | Status: DC | PRN

## 2016-05-22 MED ORDER — LIDOCAINE-PRILOCAINE 2.5-2.5 % EX CREA: 1.0000 "application " | TOPICAL_CREAM | CUTANEOUS | Status: DC | PRN

## 2016-05-22 MED ORDER — NALOXONE HCL 0.4 MG/ML IJ SOLN
0.4000 mg | Freq: Once | INTRAMUSCULAR | Status: AC
  Administered 2016-05-22: 0.4 mg via INTRAVENOUS
  Filled 2016-05-22: qty 1

## 2016-05-22 MED ORDER — PRO-STAT SUGAR FREE PO LIQD
30.0000 mL | Freq: Two times a day (BID) | ORAL | Status: DC
  Administered 2016-05-23 – 2016-05-27 (×4): 30 mL via ORAL
  Filled 2016-05-22 (×9): qty 30

## 2016-05-22 MED ORDER — ALTEPLASE 2 MG IJ SOLR: 2.0000 mg | Freq: Once | INTRAMUSCULAR | Status: DC | PRN

## 2016-05-22 MED ORDER — OLANZAPINE 5 MG PO TBDP: 2.5000 mg | ORAL_TABLET | Freq: Every day | ORAL | Status: DC

## 2016-05-22 MED ORDER — SODIUM CHLORIDE 0.9 % IV BOLUS (SEPSIS): 250.0000 mL | Freq: Once | INTRAVENOUS | Status: DC

## 2016-05-22 NOTE — Progress Notes (Addendum)
Palliative Care  I was called to the room by Palliative RN, Mariana Arn, with concern for pt's significant somnolence. On my assessment pt would open her eyes to loud voice and was oriented to person, place, and some components of situation. She could not consistently follow commands, nor could she answer anything beyond orientation questions. Last sedating medication was 4/10 at 1041 (Morphine  IV and Percocet 5-325 1 tab PO). Last dialysis was 4/9. Suspect she may have a combination of drug-related somnolence and potentially delirium. She has multiple risk factors for delirium, and the clinical picture would also fit with progressive altered mentation (per care nurse, worsening over past few days).  I spoke with care nurse who relates altered mentation has been worsening over the past few days, characterized by agitation and calling out yesterday, and now lethargy today. Nephrology happened to be outside the room, Dr. Lowell Guitar noted that she was fully alert and oriented when he saw her yesterday. He feels her presentation today may also be due to oversedation.   Plan  Primary team notified of somnolence, narcan recommended  Pt with three PRN pain medication ordered; have simplified to Morphine  q4H PRN and Percocet 5-352 1 tablet PO q4H PRN  Spoke with care nurse re. her concerns about transport to dialysis, pt can go in her bed while in the hospital (do not need to transfer her to wheelchair); Surgery will need to weigh-in on plan for after discharge as lifts utilized for transferring will require sling going directly over her thigh wounds  Standard delirium management (adapted from NICE guidelines 2011 for prevention of delirium):  Provide continuity of care when possible (avoid frequent changing of surroundings and staff).  Frequent reorientation to time with:  A clock should always be visible.  Make sure Calendar/white board is updated.  Lights on/blinds open during the day and  off/closed at night.  Encourage frequent family visits.  Monitor for and treat dehydration/constipation.  Optimize oxygen saturation.  Avoid catheters and IV's when possible and look for/treat infections.  Encourage early mobility.  Assess and treat pain.  Ensure adequate nutrition and functioning dentures.  Address reversible causes of hearing and visual impairment:  Use pocket talker if hearing aids are unavailable.  Avoid sleep disturbance (normalize sleep/wake cycle).  Minimize disturbances and consider NOT obtaining vitals at night if possible.  Review Medications to avoid polypharmacy and avoid deliriogenic medications when possible     Total time: 35 minutes Greater than 50% was spent in counseling and coordination of care with the patient.  Murrell Converse [facility] Palliative Care [phone] (team phone)

## 2016-05-22 NOTE — Progress Notes (Signed)
PROGRESS NOTE  [name]  ZOX:[id number] DOB: 1975-10-01 DOA: 05/01/2016 PCP: Alvester Chou, NP  Brief Narrative:  41 y.o.femalewith medical history significant formorbid obesity, chronic kidney disease stage IV-V, diabetes mellitus, hypertension, prior CVA, diastolic CHF who presented to ED with progressive weakness, fever for past 1 week prior to this admission. She is actually bed bound for past year or so per her husband report ever since stroke. She also had dry cough and shortness of breath for past 1 week.  Her creatinine was 7.9 (much worse since her recent Cr values of 4-5).  She was started on hemodialysis and underwent left upper arm AV graft placement on 4/3.  She was anticipating going home with her family but because she is too weak to assist with transfers and is morbidly obese, she would need a hoyer lift, hospital bed, and wheelchair for transport to dialysis.  Unfortunately, she was given a hospital bed last year which was "lost" and no home health agencies in the region will provide her with equipment.  She was told that she will need to go to SNF for short term rehabilitation to regain enough strength to at least transfer from a low bed/mattress to a chair in order to get to HD.  She has been having intermittent fevers without defined source.  Her fevers appeared to get better with cessation of antibiotics, however, they returned.  This may be due to a gangrenous area on the posterior left thigh which was debrided by general surgery on 4/10 in the OR.  As part of her FUO work up, however, she was also found to be ANA positive with a high scl-70 titer.  I speculate that maybe her bilateral leg ulcers, skin gangrene, renal failure, fatigue/weakness, and intermittent fevers may be due to autoimmune disease.  I called [facility] and left a message for a provider to call me back to discuss her case.    Assessment & Plan:   Principal Problem:   Acute metabolic  encephalopathy Active Problems:   Morbid obesity ([place])   Essential hypertension   History of CVA with residual deficit   Anemia of chronic disease   Lymphedema   Debility   Acute pulmonary edema (HCC)   Acute renal failure superimposed on stage 5 chronic kidney disease, not on chronic dialysis ([place])   Acute respiratory failure with hypoxia (HCC)   Right middle lobe pneumonia ([place])   Leukocytosis   Uncontrolled diabetes mellitus with diabetic nephropathy, with long-term current use of insulin (HCC)   Dyslipidemia associated with type 2 diabetes mellitus ([place])   ESRD (end stage renal disease) ([place])   Goals of care, counseling/discussion   Palliative care by specialist  ESRD with acute pulmonary edema initially attributed uncontrolled diabetes and FSG from obesity, however, her diabetes is not longstanding and not always dramatically uncontrolled.  Questioning whether she may have an autoimmune component to her diagnosis.   -  Patient was initially placed on aggressive IV Lasix and metolazone but progressed to ESRD - Tunneled dialysis cath Placed on 3/27. Hemodialysis initiated 3/28, then 3/29, HD 4/1, HD 4/2 - Accepted at [place]  - Vascular surgery also following, AV graft placement 4/3  Recurrent fever could be due to gangrenous left posterior leg ulcer but she is also ANA positive with high Scleroderma ab titers.  She had coag negative staph from 1/2 blood cultures on 3/21 which was felt to be contaminant.  Other cultures are so far negative.   -  CT scan of chest was reviewed, from 3/22:  No evidence of abscess or source of fever -  Recent CXR and urine culture have been negative -  Repeat blood culture:  NGTD -  CT a/p to eval for abscess:  No obvious source of fever -  HIV negative during this hospitalization -  ESR 4 -  LDH 323 -  ANA positive, reflex positive for scleroderma antibody -  CK 62 - RF 26.1 -  Quantiferon pending -  SPEP pending -   Appreciate wound care assistance -  General surgery consulted > s/p OR debridement 4/10 under general anesthesia -  Resume vancomycin and zosyn pending debridement, wound care per surgery team  Morbid obesity / Debility/ generalized weakness.  Unable to assist with transfers -  No home health agency will provide equipment and services to home because of lost equipment in the past -  SW consulted for SNF -  Nutrition consulted -  Body mass index is 54.58 kg/m. -  Wt has gone down about 70-lbs since admission  Acute respiratory failure with hypoxia with possible right middle lobe pneumonia vs. Atelectasis on CXR.  Persistent fevers, leukocytosis despite more than a week of doxycycline and the addition of ceftaz    Acute metabolic encephalopathy - less alert than baseline mental status per family - Likely multifactorial from history of CVA, end stage renal disease, possible uremia, infectious process - Had a discussion with palliative care team, they were concerned about opioid effects and recommending trial of naloxone x 1 dose which was ordered  Chest pain episode  -  Troponin 1 was 0.04 however d-dimer was 2.01 - VQ scan Negative for any PE  Anemia of chronic disease / IDA - Status post 2 units PRBC transfusion on 05/02/2016  - Aranesp 3/22, continue iron supplementation, B12  Uncontrolled diabetes mellitus with diabetic nephropathy with long term insulin use, hyperglycemic - continue lantus 10 units - start aspart 3 units TIC AC -  Continue low dose SSI  CBG (last 3)   Recent Labs  05/21/16 2140 05/22/16 0809 05/22/16 1203  GLUCAP 192* 195* 225*   Dyslipidemia associated with type 2 DM - Continue statin therapy   Essential hypertension, blood pressures low normal despite holding BP medications  Bilateral inner thigh wounds, left leg developed foul odor and area of central gangrene.     - Related to moisture and friction from thighs rubbing together -  Appreciate  general surgery assistance, debrided on 4/10  DVT prophylaxis:  heparin Code Status:  full Family Communication:  Patient alone Disposition Plan:   Will need to go to SNF.     Consultants:   Nephrology  WOC  Palliative care   Nutrition  Vascular surgery   Procedures:  Hemodialysis initiated 3/28  Antimicrobials:  Anti-infectives    Start     Dose/Rate Route Frequency Ordered Stop   05/22/16 1200  vancomycin (VANCOCIN) 1,250 mg in sodium chloride 0.9 % 250 mL IVPB  Status:  Discontinued     1,250 mg 250 mL/hr over 60 Minutes Intravenous Every M-W-F (Hemodialysis) 05/20/16 1024 05/20/16 1027   05/22/16 1200  vancomycin (VANCOCIN) IVPB 1000 mg/200 mL premix     1,000 mg 200 mL/hr over 60 Minutes Intravenous Every M-W-F (Hemodialysis) 05/20/16 1027     05/20/16 1200  vancomycin (VANCOCIN) 2,250 mg in sodium chloride 0.9 % 250 mL IVPB     2,250 mg 250 mL/hr over 60 Minutes Intravenous Every Mon (Hemodialysis) 05/20/16 1024  05/20/16 1513   05/20/16 1030  piperacillin-tazobactam (ZOSYN) IVPB 3.375 g     3.375 g 12.5 mL/hr over 240 Minutes Intravenous Every 12 hours 05/20/16 1012     05/16/16 1630  cefTRIAXone (ROCEPHIN) 1 g in dextrose 5 % 50 mL IVPB  Status:  Discontinued     1 g 100 mL/hr over 30 Minutes Intravenous Every 24 hours 05/16/16 1625 05/17/16 1641   05/14/16 1430  cefTAZidime (FORTAZ) 1 g in dextrose 5 % 50 mL IVPB  Status:  Discontinued     1 g 100 mL/hr over 30 Minutes Intravenous Every 24 hours 05/14/16 1406 05/15/16 1325   05/14/16 1430  doxycycline (VIBRA-TABS) tablet 100 mg  Status:  Discontinued     100 mg Oral Every 12 hours 05/14/16 1406 05/15/16 1325   05/14/16 0000  ceFAZolin (ANCEF) IVPB 1 g/50 mL premix  Status:  Discontinued    Comments:  Send with pt to OR   1 g 100 mL/hr over 30 Minutes Intravenous On call 05/13/16 1630 05/15/16 1325   05/12/16 1330  cefTAZidime (FORTAZ) 1 g in dextrose 5 % 50 mL IVPB  Status:  Discontinued     1 g 100 mL/hr  over 30 Minutes Intravenous Every 24 hours 05/12/16 1319 05/14/16 1406   05/08/16 1000  vancomycin (VANCOCIN) IVPB 1000 mg/200 mL premix     1,000 mg 200 mL/hr over 60 Minutes Intravenous To Surgery 05/07/16 1904 05/08/16 1004   05/08/16 0902  vancomycin (VANCOCIN) 1-5 GM/200ML-% IVPB    Comments:  Vernell Leep, [place]   : cabinet override      05/08/16 0902 05/08/16 0904   05/07/16 1300  ceFAZolin (ANCEF) 3 g in dextrose 5 % 50 mL IVPB     3 g 130 mL/hr over 30 Minutes Intravenous To Radiology 05/07/16 1232 05/07/16 1330   05/02/16 2200  doxycycline (VIBRA-TABS) tablet 100 mg  Status:  Discontinued     100 mg Oral Every 12 hours 05/02/16 1732 05/14/16 1406   05/01/16 2200  piperacillin-tazobactam (ZOSYN) IVPB 2.25 g  Status:  Discontinued     2.25 g 100 mL/hr over 30 Minutes Intravenous Every 8 hours 05/01/16 1324 05/02/16 1729   05/01/16 1330  vancomycin (VANCOCIN) 2,500 mg in sodium chloride 0.9 % 500 mL IVPB     2,500 mg 250 mL/hr over 120 Minutes Intravenous  Once 05/01/16 1309 05/01/16 1551   05/01/16 1300  piperacillin-tazobactam (ZOSYN) IVPB 3.375 g     3.375 g 100 mL/hr over 30 Minutes Intravenous  Once 05/01/16 1258 05/01/16 1340   05/01/16 1300  vancomycin (VANCOCIN) IVPB 1000 mg/200 mL premix  Status:  Discontinued     1,000 mg 200 mL/hr over 60 Minutes Intravenous  Once 05/01/16 1258 05/01/16 1308     Subjective:  Lethargic but arousable and follows commands at times   Objective: Vitals:   05/21/16 2141 05/22/16 0318 05/22/16 0544 05/22/16 0958  BP: (!) 115/55  (!) 112/46 (!) 103/52  Pulse: (!) 103  (!) 101 96  Resp: '18  19 18  '$ Temp: 98.6 F (37 C)  98.7 F (37.1 C) 99.7 F (37.6 C)  TempSrc: Oral  Oral Axillary  SpO2: 90%  93% 95%  Weight: (!) 139.2 kg (306 lb 14.1 oz) (!) 139.2 kg (306 lb 14.1 oz)    Height:        Intake/Output Summary (Last 24 hours) at 05/22/16 1239 Last data filed at 05/22/16 1021  Gross per 24 hour  Intake  210 ml  Output                 0 ml  Net              210 ml   Filed Weights   05/21/16 0521 05/21/16 2141 05/22/16 0318  Weight: (!) 139.3 kg (307 lb) (!) 139.2 kg (306 lb 14.1 oz) (!) 139.2 kg (306 lb 14.1 oz)    Examination:  General exam:  Adult female, obese.  Sleepy but arousable.   HEENT:  NCAT, MMM Respiratory system: Clear to auscultation bilaterally Cardiovascular system: Regular rate and rhythm, normal S1/S2. No murmurs, rubs, gallops or clicks.  Warm extremities Gastrointestinal system: Normal active bowel sounds, soft, nondistended, nontender. MSK:  Normal tone and bulk, legs appear wrinkled as if previous swelling has improved.  Neuro:  Diffusely weak, decreased muscle tone and bulk Skin:  Wounds clean and dry.  Data Reviewed: I have personally reviewed following labs and imaging studies  CBC:  Recent Labs Lab 05/17/16 0926 05/19/16 1559 05/20/16 0647 05/21/16 0721 05/22/16 0403  WBC 14.9* 13.9* 13.5* 14.3* 13.7*  NEUTROABS  --  10.1*  --   --   --   HGB 7.9* 8.8* 8.7* 9.2* 8.4*  HCT 25.9* 27.9* 27.9* 29.7* 27.8*  MCV 88.7 87.7 88.3 88.4 88.8  PLT 257 356 349 390 427*   Basic Metabolic Panel:  Recent Labs Lab 05/17/16 0926 05/20/16 0647 05/21/16 0721 05/22/16 0403  NA 135 133* 134* 136  K 3.9 4.6 4.5 4.2  CL 95* 93* 96* 93*  CO2 25 23 21* 24  GLUCOSE 206* 185* 198* 200*  BUN 52* 68* 45* 65*  CREATININE 4.38* 5.63* 4.22* 5.03*  CALCIUM 9.2 9.1 9.2 9.3  PHOS 4.1 4.9* 5.6* 6.3*   GFR: Estimated Creatinine Clearance: 21.6 mL/min (A) (by C-G formula based on SCr of 5.03 mg/dL (H)). Liver Function Tests:  Recent Labs Lab 05/17/16 0926 05/20/16 0647 05/21/16 0721 05/22/16 0403  ALBUMIN 2.0* 2.0* 2.3* 2.0*   No results for input(s): LIPASE, AMYLASE in the last 168 hours. No results for input(s): AMMONIA in the last 168 hours. Coagulation Profile: No results for input(s): INR, PROTIME in the last 168 hours. Cardiac Enzymes:  Recent Labs Lab  05/19/16 1559  CKTOTAL 62   BNP (last 3 results) No results for input(s): PROBNP in the last 8760 hours. HbA1C: No results for input(s): HGBA1C in the last 72 hours. CBG:  Recent Labs Lab 05/21/16 1137 05/21/16 1705 05/21/16 2140 05/22/16 0809 05/22/16 1203  GLUCAP 197* 209* 192* 195* 225*   Lipid Profile: No results for input(s): CHOL, HDL, LDLCALC, TRIG, CHOLHDL, LDLDIRECT in the last 72 hours. Thyroid Function Tests: No results for input(s): TSH, T4TOTAL, FREET4, T3FREE, THYROIDAB in the last 72 hours. Anemia Panel: No results for input(s): VITAMINB12, FOLATE, FERRITIN, TIBC, IRON, RETICCTPCT in the last 72 hours. Urine analysis:    Component Value Date/Time   COLORURINE AMBER (A) 05/16/2016 1142   APPEARANCEUR TURBID (A) 05/16/2016 1142   LABSPEC 1.019 05/16/2016 1142   PHURINE 5.0 05/16/2016 1142   GLUCOSEU NEGATIVE 05/16/2016 1142   HGBUR MODERATE (A) 05/16/2016 1142   BILIRUBINUR NEGATIVE 05/16/2016 1142   KETONESUR NEGATIVE 05/16/2016 1142   PROTEINUR 100 (A) 05/16/2016 1142   UROBILINOGEN 0.2 10/26/2013 1721   NITRITE NEGATIVE 05/16/2016 1142   LEUKOCYTESUR MODERATE (A) 05/16/2016 1142    Recent Results (from the past 240 hour(s))  MRSA PCR Screening     Status: None  Collection Time: 05/13/16 11:26 AM  Result Value Ref Range Status   MRSA by PCR NEGATIVE NEGATIVE Final    Comment:        The GeneXpert MRSA Assay (FDA approved for NASAL specimens only), is one component of a comprehensive MRSA colonization surveillance program. It is not intended to diagnose MRSA infection nor to guide or monitor treatment for MRSA infections.   Culture, blood (Routine X 2) w Reflex to ID Panel     Status: None   Collection Time: 05/15/16  9:03 PM  Result Value Ref Range Status   Specimen Description BLOOD RIGHT HAND  Final   Special Requests   Final    BOTTLES DRAWN AEROBIC ONLY Blood Culture adequate volume   Culture NO GROWTH 6 DAYS  Final   Report Status  05/21/2016 FINAL  Final  Culture, blood (Routine X 2) w Reflex to ID Panel     Status: None   Collection Time: 05/15/16  9:10 PM  Result Value Ref Range Status   Specimen Description BLOOD RIGHT HAND  Final   Special Requests IN PEDIATRIC BOTTLE Blood Culture adequate volume  Final   Culture NO GROWTH 6 DAYS  Final   Report Status 05/21/2016 FINAL  Final  Culture, Urine     Status: None   Collection Time: 05/16/16 11:42 AM  Result Value Ref Range Status   Specimen Description URINE, RANDOM  Final   Special Requests NONE  Final   Culture NO GROWTH  Final   Report Status 05/17/2016 FINAL  Final  Culture, blood (Routine X 2) w Reflex to ID Panel     Status: None (Preliminary result)   Collection Time: 05/19/16  3:59 PM  Result Value Ref Range Status   Specimen Description BLOOD RIGHT HAND  Final   Special Requests IN PEDIATRIC BOTTLE Blood Culture adequate volume  Final   Culture NO GROWTH 3 DAYS  Final   Report Status PENDING  Incomplete  Culture, blood (Routine X 2) w Reflex to ID Panel     Status: None (Preliminary result)   Collection Time: 05/19/16  3:59 PM  Result Value Ref Range Status   Specimen Description BLOOD LEFT HAND  Final   Special Requests IN PEDIATRIC BOTTLE Blood Culture adequate volume  Final   Culture NO GROWTH 3 DAYS  Final   Report Status PENDING  Incomplete  Surgical pcr screen     Status: None   Collection Time: 05/21/16  1:27 AM  Result Value Ref Range Status   MRSA, PCR NEGATIVE NEGATIVE Final   Staphylococcus aureus NEGATIVE NEGATIVE Final    Comment:        The Xpert SA Assay (FDA approved for NASAL specimens in patients over 15 years of age), is one component of a comprehensive surveillance program.  Test performance has been validated by [facility] for patients greater than or equal to 67 year old. It is not intended to diagnose infection nor to guide or monitor treatment.   Aerobic/Anaerobic Culture (surgical/deep wound)     Status: None  (Preliminary result)   Collection Time: 05/21/16  9:35 AM  Result Value Ref Range Status   Specimen Description WOUND LEFT THIGH  Final   Special Requests NONE  Final   Gram Stain   Final    RARE WBC PRESENT, PREDOMINANTLY PMN FEW GRAM POSITIVE RODS    Culture CULTURE REINCUBATED FOR BETTER GROWTH  Final   Report Status PENDING  Incomplete   Radiology Studies: No  results found.   Scheduled Meds: . albuterol  3 mL Inhalation Q M,W,F-HD  . atorvastatin  20 mg Oral Daily  . darbepoetin (ARANESP) injection - DIALYSIS  150 mcg Intravenous Q Fri-HD  . feeding supplement (NEPRO CARB STEADY)  237 mL Oral TID BM  . ferric gluconate (FERRLECIT/NULECIT) IV  125 mg Intravenous Q M,W,F-HD  . gi cocktail  30 mL Oral Once  . heparin  5,000 Units Subcutaneous Q8H  . insulin aspart  0-5 Units Subcutaneous QHS  . insulin aspart  0-9 Units Subcutaneous TID WC  . insulin aspart  3 Units Subcutaneous TID WC  . insulin glargine  10 Units Subcutaneous QHS  . mouth rinse  15 mL Mouth Rinse BID  . multivitamin  1 tablet Oral QHS  . piperacillin-tazobactam (ZOSYN)  IV  3.375 g Intravenous Q12H  . polyethylene glycol  17 g Oral BID  . senna-docusate  1 tablet Oral BID  . sevelamer carbonate  800 mg Oral TID WC  . vancomycin  1,000 mg Intravenous Q M,W,F-HD   Continuous Infusions:   LOS: 21 days   Time spent: 31 min  Irwin Brakeman, MD [facility] Pager [phone]  If 7PM-7AM, please contact night-coverage [email or web address] Password [facility] 05/22/2016, 12:39 PM

## 2016-05-22 NOTE — Progress Notes (Signed)
OT Cancellation Note  Patient Details Name: [name] MRN: [id number] DOB: [birth year]   Cancelled Treatment:    Reason Eval/Treat Not Completed: Fatigue/lethargy limiting ability to participate  Alba Cory 05/22/2016, 12:33 PM

## 2016-05-22 NOTE — Progress Notes (Signed)
1 Day Post-Op  Subjective: She is very lethargic again this AM.  Wakes up.  Tolerated dressing change well.Site is clean, Nothing further to debride.    Objective: Vital signs in last 24 hours: Temp:  [97.8 F (36.6 C)-98.7 F (37.1 C)] 98.7 F (37.1 C) (04/11 0544) Pulse Rate:  [98-103] 101 (04/11 0544) Resp:  [17-26] 19 (04/11 0544) BP: (105-116)/(46-73) 112/46 (04/11 0544) SpO2:  [90 %-100 %] 93 % (04/11 0544) Weight:  [139.2 kg (306 lb 14.1 oz)] 139.2 kg (306 lb 14.1 oz) (04/11 0318) Last BM Date: 05/16/16   PO 60 IV 250 Urine:  0 Stool:  0   Intake/Output from previous day: 04/10 0701 - 04/11 0700 In: 460 [P.O.:60; I.V.:250; IV Piggyback:150] Out: 75 [Blood:75] Intake/Output this shift: No intake/output data recorded.  General appearance: sleepy but wakes up.  No distress.  Incision/Wound:  Site is clean, nothing to debride.  Continue BID wet to dry dressing changes.  Lab Results:   Recent Labs  05/21/16 0721 05/22/16 0403  WBC 14.3* 13.7*  HGB 9.2* 8.4*  HCT 29.7* 27.8*  PLT 390 427*    BMET  Recent Labs  05/21/16 0721 05/22/16 0403  NA 134* 136  K 4.5 4.2  CL 96* 93*  CO2 21* 24  GLUCOSE 198* 200*  BUN 45* 65*  CREATININE 4.22* 5.03*  CALCIUM 9.2 9.3   PT/INR No results for input(s): LABPROT, INR in the last 72 hours.   Recent Labs Lab 05/17/16 0926 05/20/16 0647 05/21/16 0721 05/22/16 0403  ALBUMIN 2.0* 2.0* 2.3* 2.0*     Lipase  No results found for: LIPASE   Studies/Results: No results found.  Medications: . albuterol  3 mL Inhalation Q M,W,F-HD  . atorvastatin  20 mg Oral Daily  . darbepoetin (ARANESP) injection - DIALYSIS  150 mcg Intravenous Q Fri-HD  . feeding supplement (NEPRO CARB STEADY)  237 mL Oral TID BM  . ferric gluconate (FERRLECIT/NULECIT) IV  125 mg Intravenous Q M,W,F-HD  . gi cocktail  30 mL Oral Once  . heparin  5,000 Units Subcutaneous Q8H  . insulin aspart  0-5 Units Subcutaneous QHS  . insulin  aspart  0-9 Units Subcutaneous TID WC  . insulin aspart  3 Units Subcutaneous TID WC  . insulin glargine  10 Units Subcutaneous QHS  . mouth rinse  15 mL Mouth Rinse BID  . multivitamin  1 tablet Oral QHS  . piperacillin-tazobactam (ZOSYN)  IV  3.375 g Intravenous Q12H  . polyethylene glycol  17 g Oral BID  . senna-docusate  1 tablet Oral BID  . sevelamer carbonate  800 mg Oral TID WC  . vancomycin  1,000 mg Intravenous Q M,W,F-HD     Assessment/Plan left thigh soft tissue infection s/p  excision of 15x6x2.5 cm area of skin and sub tissue Acute metabolic encephalopathy/Hx of CVA with residual deficit ESRD HD MWF, in chair. 30 kg down but still edema. Pulmonary edema/Acute respiratory failure - now resolved on room air  HTN  AODM with poor control - diabetic neuropathy Anemia HPTH vit d Body mass index is 49.53 kg/m. Immobility Fevers undergoing eval Anxiety WUJ:WJXBJ diet ID: Zosyn/Vancomycin per pharmacy 05/20/16 =>> day 3 (gm + rods from wound culture so far) DVT:  Heparin  Plan:  Continue wet to dry dressing changes BID.  It looks good may consider Wound vac later if it continues to look good.     LOS: 21 days    [name] 05/22/2016 [id number]

## 2016-05-22 NOTE — Progress Notes (Signed)
Nutrition Follow-up  DOCUMENTATION CODES:   Morbid obesity  INTERVENTION:  Continue Nepro Shake po TID, each supplement provides 425 kcal and 19 grams protein.  Provide 30 ml Prostat po BID, each supplement provides 100 kcal and 15 grams of protein.   NUTRITION DIAGNOSIS:   Inadequate oral intake related to acute illness, poor appetite as evidenced by meal completion < 25%; ongoing  GOAL:   Patient will meet greater than or equal to 90% of their needs; progressing  MONITOR:   PO intake, Supplement acceptance, Labs, Skin  REASON FOR ASSESSMENT:   Consult Diet education  ASSESSMENT:   41 y.o. female with medical history significant of morbid obesity, chronic kidney disease stage IV-V, diabetes mellitus, hypertension, prior CVA, diastolic CHF, presents to the emergency room with chief complaint of weakness as well as fever. Patient found to have HCAP Pt new ESRD on HD.  HD initiated 3/28.   Pt lethargic today. Per MD note, suspect lethargy from combination of drug related somnolence and delirium. Pt not hungry this AM. Pt currently has Nepro Shake ordered with varied consumption. RD to additionally order Prostat to aid in adequate protein needs. RD to continue to monitor.   Labs and medications reviewed. Phosphorous elevated at 6.3.  Diet Order:  Diet - low sodium heart healthy Diet renal with fluid restriction Fluid restriction: 1200 mL Fluid; Room service appropriate? Yes; Fluid consistency: Thin  Skin:  Wound (see comment) (Stage II to R thigh)  Last BM:  4/10  Height:   Ht Readings from Last 1 Encounters:  05/07/16  (1.676 m)    Weight:   Wt Readings from Last 1 Encounters:  05/22/16 (!) 307 lb 12.2 oz (139.6 kg)    Ideal Body Weight:  59 kg  BMI:  Body mass index is 49.67 kg/m.  Estimated Nutritional Needs:   Kcal:  2200-2400kcal/day   Protein:  115-130 grams  Fluid:  >2.2L/day   EDUCATION NEEDS:   Education needs no appropriate at this  time  Roslyn Smiling, MS, RD, LDN Pager # [phone] After hours/ weekend pager # [phone]

## 2016-05-23 DIAGNOSIS — J9601 Acute respiratory failure with hypoxia: Secondary | ICD-10-CM

## 2016-05-23 DIAGNOSIS — L03115 Cellulitis of right lower limb: Secondary | ICD-10-CM

## 2016-05-23 DIAGNOSIS — N178 Other acute kidney failure: Secondary | ICD-10-CM

## 2016-05-23 DIAGNOSIS — R5381 Other malaise: Secondary | ICD-10-CM

## 2016-05-23 DIAGNOSIS — D638 Anemia in other chronic diseases classified elsewhere: Secondary | ICD-10-CM

## 2016-05-23 DIAGNOSIS — I890 Lymphedema, not elsewhere classified: Secondary | ICD-10-CM

## 2016-05-23 DIAGNOSIS — N186 End stage renal disease: Secondary | ICD-10-CM

## 2016-05-23 DIAGNOSIS — J810 Acute pulmonary edema: Secondary | ICD-10-CM

## 2016-05-23 DIAGNOSIS — N185 Chronic kidney disease, stage 5: Secondary | ICD-10-CM

## 2016-05-23 DIAGNOSIS — I10 Essential (primary) hypertension: Secondary | ICD-10-CM

## 2016-05-23 DIAGNOSIS — J189 Pneumonia, unspecified organism: Secondary | ICD-10-CM

## 2016-05-23 LAB — CBC
HCT: 27.4 % — ABNORMAL LOW (ref 36.0–46.0)
HEMOGLOBIN: 8.4 g/dL — AB (ref 12.0–15.0)
MCH: 27.2 pg (ref 26.0–34.0)
MCHC: 30.7 g/dL (ref 30.0–36.0)
MCV: 88.7 fL (ref 78.0–100.0)
Platelets: 374 10*3/uL (ref 150–400)
RBC: 3.09 MIL/uL — AB (ref 3.87–5.11)
RDW: 16.2 % — ABNORMAL HIGH (ref 11.5–15.5)
WBC: 12.8 10*3/uL — ABNORMAL HIGH (ref 4.0–10.5)

## 2016-05-23 LAB — RENAL FUNCTION PANEL
ANION GAP: 15 (ref 5–15)
Albumin: 1.9 g/dL — ABNORMAL LOW (ref 3.5–5.0)
BUN: 29 mg/dL — ABNORMAL HIGH (ref 6–20)
CALCIUM: 8.7 mg/dL — AB (ref 8.9–10.3)
CHLORIDE: 95 mmol/L — AB (ref 101–111)
CO2: 22 mmol/L (ref 22–32)
Creatinine, Ser: 3.02 mg/dL — ABNORMAL HIGH (ref 0.44–1.00)
GFR calc non Af Amer: 18 mL/min — ABNORMAL LOW (ref >60)
GFR, EST AFRICAN AMERICAN: 21 mL/min — AB (ref >60)
GLUCOSE: 153 mg/dL — AB (ref 65–99)
POTASSIUM: 3.7 mmol/L (ref 3.5–5.1)
Phosphorus: 4.1 mg/dL (ref 2.5–4.6)
SODIUM: 132 mmol/L — AB (ref 135–145)

## 2016-05-23 LAB — GLUCOSE, CAPILLARY
GLUCOSE-CAPILLARY: 182 mg/dL — AB (ref 65–99)
GLUCOSE-CAPILLARY: 191 mg/dL — AB (ref 65–99)
Glucose-Capillary: 165 mg/dL — ABNORMAL HIGH (ref 65–99)
Glucose-Capillary: 200 mg/dL — ABNORMAL HIGH (ref 65–99)

## 2016-05-23 NOTE — Progress Notes (Signed)
Assessment/Plan: 1 ESRD HD MWF, in chair- weight down . 2 HTN,  off meds 3 anemia esa 4 HPTH vit d 5 Severe obesty 6 Immobility 7 Fevers, improved 8 Hx of CVA 9 Soft tissue infx of left thigh, s/p deep debridement today ? Source of fever 10 Serology pos for Scleroderma/ pos ANA ? titer   Objective: Vital signs in last 24 hours: Temp:  [97.5 F (36.4 C)-99.3 F (37.4 C)] 97.5 F (36.4 C) (04/12 0921) Pulse Rate:  [73-105] 105 (04/12 1300) Resp:  [16-23] 16 (04/12 0921) BP: (62-140)/(33-69) 140/64 (04/12 1300) SpO2:  [97 %-100 %] 100 % (04/12 1300) Weight:  [58.1 kg (128 lb)-139.6 kg (307 lb 12.2 oz)] 58.1 kg (128 lb 1.4 oz) (04/12 0500) Weight change: 0.4 kg (14.1 oz)  Intake/Output from previous day: 04/11 0701 - 04/12 0700 In: 50 [IV Piggyback:50] Out: 0  Intake/Output this shift: Total I/O In: 3 [P.O.:3] Out: 0   flat affect  Lungs clear Cor RRR Thigh bandaged L  Lab Results:  Recent Labs  05/22/16 0403 05/23/16 0503  WBC 13.7* 12.8*  HGB 8.4* 8.4*  HCT 27.8* 27.4*  PLT 427* 374   BMET:  Recent Labs  05/22/16 0403 05/23/16 0503  NA 136 132*  K 4.2 3.7  CL 93* 95*  CO2 24 22  GLUCOSE 200* 153*  BUN 65* 29*  CREATININE 5.03* 3.02*  CALCIUM 9.3 8.7*   No results for input(s): PTH in the last 72 hours. Iron Studies: No results for input(s): IRON, TIBC, TRANSFERRIN, FERRITIN in the last 72 hours. Studies/Results: No results found.  Scheduled: . albuterol  3 mL Inhalation Q M,W,F-HD  . atorvastatin  20 mg Oral Daily  . darbepoetin (ARANESP) injection - DIALYSIS  150 mcg Intravenous Q Fri-HD  . feeding supplement (NEPRO CARB STEADY)  237 mL Oral TID BM  . feeding supplement (PRO-STAT SUGAR FREE 64)  30 mL Oral BID  . ferric gluconate (FERRLECIT/NULECIT) IV  125 mg Intravenous Q M,W,F-HD  . gi cocktail  30 mL Oral Once  . heparin  5,000 Units Subcutaneous Q8H  . insulin aspart  0-5 Units Subcutaneous QHS  . insulin aspart  0-9 Units  Subcutaneous TID WC  . insulin aspart  3 Units Subcutaneous TID WC  . insulin glargine  10 Units Subcutaneous QHS  . mouth rinse  15 mL Mouth Rinse BID  . multivitamin  1 tablet Oral QHS  . piperacillin-tazobactam (ZOSYN)  IV  3.375 g Intravenous Q12H  . polyethylene glycol  17 g Oral BID  . senna-docusate  1 tablet Oral BID  . sevelamer carbonate  800 mg Oral TID WC  . vancomycin  1,000 mg Intravenous Q M,W,F-HD     LOS: 22 days   [name] C 05/23/2016,3:32 PM

## 2016-05-23 NOTE — Progress Notes (Signed)
Pharmacy Antibiotic Note  [name] is a 41 y.o. female admitted on 05/01/2016 with weakness and fever. Pharmacy has been consulted for Vancomycin + Zosyn dosing for empiric coverage in the setting of fevers and known B/L LE inner thigh wounds. Pt is new ESRD on HD MWF, and now s/p OR on 4/10 for debridement of L thigh wound. Blood cultures remain no growth to date, and wound cultures growing few gram negative rods.  Plan: -Zosyn 3.375g IV every 12 hours -Vancomycin 1g qHD MWF -Check pre-HD vancomycin level tomorrow -Continue to follow HD schedule/duration, culture results, LOT, and antibiotic de-escalation plans   Height:  (167.6 cm) Weight: 128 lb 1.4 oz (58.1 kg) IBW/kg (Calculated) : 59.3  Temp (24hrs), Avg:98.8 F (37.1 C), Min:98.1 F (36.7 C), Max:99.7 F (37.6 C)   Recent Labs Lab 05/17/16 0926 05/19/16 1559 05/20/16 0647 05/21/16 0721 05/22/16 0403 05/23/16 0503  WBC 14.9* 13.9* 13.5* 14.3* 13.7* 12.8*  CREATININE 4.38*  --  5.63* 4.22* 5.03* 3.02*    Estimated Creatinine Clearance: 22.9 mL/min (A) (by C-G formula based on SCr of 3.02 mg/dL (H)).    Allergies  Allergen Reactions  . Azithromycin Other (See Comments)    Nose bleeding event    Antimicrobials this admission: 3/21 Vanc>>3/22; restart 4/9 >> 3/21 Zosyn>>3/22; restart 4/9 >> 3/22 Doxy>>4/4 4/1 Ceftaz >>4/4  Dose adjustments this admission: 3/23 @ 0530:  RVL 25 after  on 3/23.  Drug d/c'd.  Microbiology results: 4/10 Wound Cx: gram positive rods 4/8 BCx: NGTD 4/5 UC: negative 4/4 Bcx: ngtd 3/21BCx: CONS in 1/2 bottles 3/21 UCx: neg 3/21 HIV Ab: neg  Thank you for allowing pharmacy to be a part of this patient's care.  Fredonia Highland, PharmD PGY-1 Pharmacy Resident Pager: [phone] 05/23/2016

## 2016-05-23 NOTE — Progress Notes (Signed)
OT Cancellation Note  Patient Details Name: [name] MRN: [id number] DOB: 05-Apr-1975   Cancelled Treatment:    Reason Eval/Treat Not Completed: Fatigue/lethargy limiting ability to participate   Pt with PT and is lethargic.  Pt not able to perform any ADL activity at this time as she is not waking up.  Will check on pt in next day or two to see if appropriate for OT, but at this time she is not.    Dorena Bodo [place], [place] [phone] 05/23/2016, 11:55 AM

## 2016-05-23 NOTE — Progress Notes (Signed)
[facility] Progress Note  2 Days Post-Op  Subjective: Pt is lethargic and sleepy but will respond to voice. States no pain in her leg  Objective: Vital signs in last 24 hours: Temp:  [97.5 F (36.4 C)-99.7 F (37.6 C)] 97.5 F (36.4 C) (04/12 0921) Pulse Rate:  [73-106] 97 (04/12 0921) Resp:  [16-25] 16 (04/12 0921) BP: (62-137)/(33-72) 102/58 (04/12 0921) SpO2:  [95 %-100 %] 100 % (04/12 0921) Weight:  [128 lb (58.1 kg)-307 lb 12.2 oz (139.6 kg)] 128 lb 1.4 oz (58.1 kg) (04/12 0500) Last BM Date: 05/22/16  Intake/Output from previous day: 04/11 0701 - 04/12 0700 In: 50 [IV Piggyback:50] Out: 0  Intake/Output this shift: No intake/output data recorded.  PE: Gen:  Sleepy, lethargic, responds to voice Pulm:  Rate and effort normal Skin: warm and dry, wound in medial left upper thigh is without purulent drainage, repacked, see below      Lab Results:   Recent Labs  05/22/16 0403 05/23/16 0503  WBC 13.7* 12.8*  HGB 8.4* 8.4*  HCT 27.8* 27.4*  PLT 427* 374   BMET  Recent Labs  05/22/16 0403 05/23/16 0503  NA 136 132*  K 4.2 3.7  CL 93* 95*  CO2 24 22  GLUCOSE 200* 153*  BUN 65* 29*  CREATININE 5.03* 3.02*  CALCIUM 9.3 8.7*   PT/INR No results for input(s): LABPROT, INR in the last 72 hours. CMP     Component Value Date/Time   NA 132 (L) 05/23/2016 0503   NA 137 09/16/2013 1301   K 3.7 05/23/2016 0503   K 4.7 09/16/2013 1301   CL 95 (L) 05/23/2016 0503   CL 108 (H) 09/16/2013 1301   CO2 22 05/23/2016 0503   CO2 24 09/16/2013 1301   GLUCOSE 153 (H) 05/23/2016 0503   GLUCOSE 125 (H) 09/16/2013 1301   BUN 29 (H) 05/23/2016 0503   BUN 15 09/16/2013 1301   CREATININE 3.02 (H) 05/23/2016 0503   CREATININE 1.79 (H) 09/16/2013 1301   CALCIUM 8.7 (L) 05/23/2016 0503   CALCIUM 8.3 (L) 09/16/2013 1301   PROT 7.1 05/02/2016 0555   PROT 7.0 09/16/2013 1301   ALBUMIN 1.9 (L) 05/23/2016 0503   ALBUMIN 1.3 (L) 09/16/2013 1301   AST 10 (L)  05/02/2016 0555   AST 10 (L) 09/16/2013 1301   ALT 7 (L) 05/02/2016 0555   ALT 11 (L) 09/16/2013 1301   ALKPHOS 60 05/02/2016 0555   ALKPHOS 59 09/16/2013 1301   BILITOT 1.0 05/02/2016 0555   BILITOT 0.2 09/16/2013 1301   GFRNONAA 18 (L) 05/23/2016 0503   GFRNONAA 36 (L) 09/16/2013 1301   GFRAA 21 (L) 05/23/2016 0503   GFRAA 42 (L) 09/16/2013 1301   Lipase  No results found for: LIPASE     Studies/Results: No results found.  Anti-infectives: Anti-infectives    Start     Dose/Rate Route Frequency Ordered Stop   05/22/16 1200  vancomycin (VANCOCIN) 1,250 mg in sodium chloride 0.9 % 250 mL IVPB  Status:  Discontinued     1,250 mg 250 mL/hr over 60 Minutes Intravenous Every M-W-F (Hemodialysis) 05/20/16 1024 05/20/16 1027   05/22/16 1200  vancomycin (VANCOCIN) IVPB 1000 mg/200 mL premix     1,000 mg 200 mL/hr over 60 Minutes Intravenous Every M-W-F (Hemodialysis) 05/20/16 1027     05/20/16 1200  vancomycin (VANCOCIN) 2,250 mg in sodium chloride 0.9 % 250 mL IVPB     2,250 mg 250 mL/hr over 60 Minutes Intravenous Every  Mon (Hemodialysis) 05/20/16 1024 05/20/16 1513   05/20/16 1030  piperacillin-tazobactam (ZOSYN) IVPB 3.375 g     3.375 g 12.5 mL/hr over 240 Minutes Intravenous Every 12 hours 05/20/16 1012     05/16/16 1630  cefTRIAXone (ROCEPHIN) 1 g in dextrose 5 % 50 mL IVPB  Status:  Discontinued     1 g 100 mL/hr over 30 Minutes Intravenous Every 24 hours 05/16/16 1625 05/17/16 1641   05/14/16 1430  cefTAZidime (FORTAZ) 1 g in dextrose 5 % 50 mL IVPB  Status:  Discontinued     1 g 100 mL/hr over 30 Minutes Intravenous Every 24 hours 05/14/16 1406 05/15/16 1325   05/14/16 1430  doxycycline (VIBRA-TABS) tablet 100 mg  Status:  Discontinued     100 mg Oral Every 12 hours 05/14/16 1406 05/15/16 1325   05/14/16 0000  ceFAZolin (ANCEF) IVPB 1 g/50 mL premix  Status:  Discontinued    Comments:  Send with pt to OR   1 g 100 mL/hr over 30 Minutes Intravenous On call 05/13/16  1630 05/15/16 1325   05/12/16 1330  cefTAZidime (FORTAZ) 1 g in dextrose 5 % 50 mL IVPB  Status:  Discontinued     1 g 100 mL/hr over 30 Minutes Intravenous Every 24 hours 05/12/16 1319 05/14/16 1406   05/08/16 1000  vancomycin (VANCOCIN) IVPB 1000 mg/200 mL premix     1,000 mg 200 mL/hr over 60 Minutes Intravenous To Surgery 05/07/16 1904 05/08/16 1004   05/08/16 0902  vancomycin (VANCOCIN) 1-5 GM/200ML-% IVPB    Comments:  Matilde Haymaker, [name]   : cabinet override      05/08/16 0902 05/08/16 0904   05/07/16 1300  ceFAZolin (ANCEF) 3 g in dextrose 5 % 50 mL IVPB     3 g 130 mL/hr over 30 Minutes Intravenous To Radiology 05/07/16 1232 05/07/16 1330   05/02/16 2200  doxycycline (VIBRA-TABS) tablet 100 mg  Status:  Discontinued     100 mg Oral Every 12 hours 05/02/16 1732 05/14/16 1406   05/01/16 2200  piperacillin-tazobactam (ZOSYN) IVPB 2.25 g  Status:  Discontinued     2.25 g 100 mL/hr over 30 Minutes Intravenous Every 8 hours 05/01/16 1324 05/02/16 1729   05/01/16 1330  vancomycin (VANCOCIN) 2,500 mg in sodium chloride 0.9 % 500 mL IVPB     2,500 mg 250 mL/hr over 120 Minutes Intravenous  Once 05/01/16 1309 05/01/16 1551   05/01/16 1300  piperacillin-tazobactam (ZOSYN) IVPB 3.375 g     3.375 g 100 mL/hr over 30 Minutes Intravenous  Once 05/01/16 1258 05/01/16 1340   05/01/16 1300  vancomycin (VANCOCIN) IVPB 1000 mg/200 mL premix  Status:  Discontinued     1,000 mg 200 mL/hr over 60 Minutes Intravenous  Once 05/01/16 1258 05/01/16 1308       Assessment/Plan  Pulmonary edema/Acute respiratory failure - now resolved on room air HTN  AODM with poor control - diabetic neuropathy Anemia HPTH vit d Body mass index 49.53 kg/m. Immobility Anxiety Acute metabolic encephalopathy/Hx of CVA with residual deficit ESRD HD MWF, in chair. 30 kg down but still edema.  left thigh soft tissue infection s/p excision of 15x6x2.5cm area of skin and sub tissue, 4/10, Dr. Dwain Sarna - wet to  dry dressing changes  ZOX:WRUEA diet ID: Zosyn/Vancomycin per pharmacy 05/20/16 =>> (gm + rods from wound culture so far) DVT:  Heparin  Plan:  Continue wet to dry dressing changes BID. May consider Wound vac prior to discharge if it continues to  look good.    LOS: 22 days    Jerre Simon , [facility] 05/23/2016, 9:56 AM Pager: [phone] Consults: [phone] Mon-Fri 7:00 am-4:30 pm Sat-Sun 7:00 am-11:30 am

## 2016-05-23 NOTE — Progress Notes (Signed)
PROGRESS NOTE  [name]  ZOX:[id number] DOB: January 02, 1976 DOA: 05/01/2016 PCP: Alvester Chou, NP  Brief Narrative:  41 y.o.femalewith medical history significant formorbid obesity, chronic kidney disease stage IV-V, diabetes mellitus, hypertension, prior CVA, diastolic CHF who presented to ED with progressive weakness, fever for past 1 week prior to this admission. She is actually bed bound for past year or so per her husband report ever since stroke. She also had dry cough and shortness of breath for past 1 week.  Her creatinine was 7.9 (much worse since her recent Cr values of 4-5).  She was started on hemodialysis and underwent left upper arm AV graft placement on 4/3.  She was anticipating going home with her family but because she is too weak to assist with transfers and is morbidly obese, she would need a hoyer lift, hospital bed, and wheelchair for transport to dialysis.  Unfortunately, she was given a hospital bed last year which was "lost" and no home health agencies in the region will provide her with equipment.  She was told that she will need to go to SNF for short term rehabilitation to regain enough strength to at least transfer from a low bed/mattress to a chair in order to get to HD.  She has been having intermittent fevers without defined source.  Her fevers appeared to get better with cessation of antibiotics, however, they returned.  This may be due to a gangrenous area on the posterior left thigh which was debrided by general surgery on 4/10 in the OR.  As part of her FUO work up, however, she was also found to be ANA positive with a high scl-70 titer.  I speculate that maybe her bilateral leg ulcers, skin gangrene, renal failure, fatigue/weakness, and intermittent fevers may be due to autoimmune disease.  I called [facility] and left a message for a provider to call me back to discuss her case.    Assessment & Plan:   Principal Problem:   Acute metabolic  encephalopathy Active Problems:   Morbid obesity ([name])   Essential hypertension   History of CVA with residual deficit   Anemia of chronic disease   Lymphedema   Debility   Acute pulmonary edema (HCC)   Acute renal failure superimposed on stage 5 chronic kidney disease, not on chronic dialysis ([name])   Acute respiratory failure with hypoxia ([name])   Right middle lobe pneumonia ([name])   Leukocytosis   Uncontrolled diabetes mellitus with diabetic nephropathy, with long-term current use of insulin ([name])   Dyslipidemia associated with type 2 diabetes mellitus ([name])   ESRD (end stage renal disease) ([name])   Goals of care, counseling/discussion   Palliative care by specialist  ESRD with acute pulmonary edema -  Patient was initially placed on aggressive IV Lasix and metolazone and progressed to ESRD - Tunneled dialysis cath Placed on 3/27. Hemodialysis initiated 3/28, then 3/29, HD 4/1, HD 4/2 - Accepted at [facility]  - Vascular surgery also following, AV graft placement 4/3  Recurrent fever could be due to gangrenous left posterior leg ulcer but she is also ANA positive with high Scleroderma ab titers.  She had coag negative staph from 1/2 blood cultures on 3/21 which was felt to be contaminant.  Other cultures are so far negative.   -  CT scan of chest was reviewed, from 3/22:  No evidence of abscess or source of fever -  Recent CXR and urine culture have been negative -  Repeat blood culture:  NGTD -  CT a/p to eval for abscess:  No obvious source of fever -  HIV negative during this hospitalization -  ESR 4 -  LDH 323 -  ANA positive, reflex positive for scleroderma antibody -  CK 62 - RF 26.1 -  Quantiferon pending -  SPEP pending -  Appreciate wound care assistance -  General surgery consulted > s/p OR debridement 4/10 under general anesthesia -  Resume vancomycin and zosyn pending debridement, wound care per surgery team  Morbid obesity / Debility/  generalized weakness.  Unable to assist with transfers -  No home health agency will provide equipment and services to home because of lost equipment in the past -  SW consulted for SNF -  Nutrition consulted -  Body mass index is 54.58 kg/m. -  Wt has gone down about 70-lbs since admission  Acute respiratory failure with hypoxia with possible right middle lobe pneumonia vs. Atelectasis on CXR.  Persistent fevers, leukocytosis despite more than a week of doxycycline and the addition of ceftaz    Acute metabolic encephalopathy - less alert than baseline mental status per family - Likely multifactorial from history of CVA, end stage renal disease, possible uremia, infectious process - Had a discussion with palliative care team, they were concerned about opioid effects and recommending trial of naloxone x 1 dose which was ordered  Chest pain episode  -  Troponin 1 was 0.04 however d-dimer was 2.01 - VQ scan Negative for any PE  Anemia of chronic disease / IDA - Status post 2 units PRBC transfusion on 05/02/2016  - Aranesp 3/22, continue iron supplementation, B12  Uncontrolled diabetes mellitus with diabetic nephropathy with long term insulin use, hyperglycemic - continue lantus 10 units - start aspart 3 units TIC AC -  Continue low dose SSI  CBG (last 3)   Recent Labs  05/22/16 1912 05/22/16 2135 05/23/16 0747  GLUCAP 123* 132* 165*   Dyslipidemia associated with type 2 DM - Continue statin therapy   Essential hypertension, blood pressures low normal despite holding BP medications  Bilateral inner thigh wounds, left leg developed foul odor and area of central gangrene.     - Related to moisture and friction from thighs rubbing together -  Appreciate general surgery assistance, debrided on 4/10  DVT prophylaxis:  heparin Code Status:  full Family Communication:  Patient alone, spoke with sister and husband 4/11 Disposition Plan:   SNF.     Consultants:    Nephrology  WOC  Palliative care   Nutrition  Vascular surgery   Procedures:  Hemodialysis initiated 3/28  Antimicrobials:  Anti-infectives    Start     Dose/Rate Route Frequency Ordered Stop   05/22/16 1200  vancomycin (VANCOCIN) 1,250 mg in sodium chloride 0.9 % 250 mL IVPB  Status:  Discontinued     1,250 mg 250 mL/hr over 60 Minutes Intravenous Every M-W-F (Hemodialysis) 05/20/16 1024 05/20/16 1027   05/22/16 1200  vancomycin (VANCOCIN) IVPB 1000 mg/200 mL premix     1,000 mg 200 mL/hr over 60 Minutes Intravenous Every M-W-F (Hemodialysis) 05/20/16 1027     05/20/16 1200  vancomycin (VANCOCIN) 2,250 mg in sodium chloride 0.9 % 250 mL IVPB     2,250 mg 250 mL/hr over 60 Minutes Intravenous Every Mon (Hemodialysis) 05/20/16 1024 05/20/16 1513   05/20/16 1030  piperacillin-tazobactam (ZOSYN) IVPB 3.375 g     3.375 g 12.5 mL/hr over 240 Minutes Intravenous Every 12 hours 05/20/16  1012     05/16/16 1630  cefTRIAXone (ROCEPHIN) 1 g in dextrose 5 % 50 mL IVPB  Status:  Discontinued     1 g 100 mL/hr over 30 Minutes Intravenous Every 24 hours 05/16/16 1625 05/17/16 1641   05/14/16 1430  cefTAZidime (FORTAZ) 1 g in dextrose 5 % 50 mL IVPB  Status:  Discontinued     1 g 100 mL/hr over 30 Minutes Intravenous Every 24 hours 05/14/16 1406 05/15/16 1325   05/14/16 1430  doxycycline (VIBRA-TABS) tablet 100 mg  Status:  Discontinued     100 mg Oral Every 12 hours 05/14/16 1406 05/15/16 1325   05/14/16 0000  ceFAZolin (ANCEF) IVPB 1 g/50 mL premix  Status:  Discontinued    Comments:  Send with pt to OR   1 g 100 mL/hr over 30 Minutes Intravenous On call 05/13/16 1630 05/15/16 1325   05/12/16 1330  cefTAZidime (FORTAZ) 1 g in dextrose 5 % 50 mL IVPB  Status:  Discontinued     1 g 100 mL/hr over 30 Minutes Intravenous Every 24 hours 05/12/16 1319 05/14/16 1406   05/08/16 1000  vancomycin (VANCOCIN) IVPB 1000 mg/200 mL premix     1,000 mg 200 mL/hr over 60 Minutes Intravenous To  Surgery 05/07/16 1904 05/08/16 1004   05/08/16 0902  vancomycin (VANCOCIN) 1-5 GM/200ML-% IVPB    Comments:  Vernell Leep, [place]   : cabinet override      05/08/16 0902 05/08/16 0904   05/07/16 1300  ceFAZolin (ANCEF) 3 g in dextrose 5 % 50 mL IVPB     3 g 130 mL/hr over 30 Minutes Intravenous To Radiology 05/07/16 1232 05/07/16 1330   05/02/16 2200  doxycycline (VIBRA-TABS) tablet 100 mg  Status:  Discontinued     100 mg Oral Every 12 hours 05/02/16 1732 05/14/16 1406   05/01/16 2200  piperacillin-tazobactam (ZOSYN) IVPB 2.25 g  Status:  Discontinued     2.25 g 100 mL/hr over 30 Minutes Intravenous Every 8 hours 05/01/16 1324 05/02/16 1729   05/01/16 1330  vancomycin (VANCOCIN) 2,500 mg in sodium chloride 0.9 % 500 mL IVPB     2,500 mg 250 mL/hr over 120 Minutes Intravenous  Once 05/01/16 1309 05/01/16 1551   05/01/16 1300  piperacillin-tazobactam (ZOSYN) IVPB 3.375 g     3.375 g 100 mL/hr over 30 Minutes Intravenous  Once 05/01/16 1258 05/01/16 1340   05/01/16 1300  vancomycin (VANCOCIN) IVPB 1000 mg/200 mL premix  Status:  Discontinued     1,000 mg 200 mL/hr over 60 Minutes Intravenous  Once 05/01/16 1258 05/01/16 1308     Subjective: Lethargic but arousable  Objective: Vitals:   05/22/16 2127 05/23/16 0432 05/23/16 0500 05/23/16 0921  BP: 97/62 (!) 97/53  (!) 102/58  Pulse: 95 97  97  Resp: '20 18  16  '$ Temp: 98.1 F (36.7 C) 99.3 F (37.4 C)  97.5 F (36.4 C)  TempSrc: Oral Oral  Oral  SpO2: 100% 98%  100%  Weight: 58.1 kg (128 lb)  58.1 kg (128 lb 1.4 oz)   Height:        Intake/Output Summary (Last 24 hours) at 05/23/16 1131 Last data filed at 05/23/16 2992  Gross per 24 hour  Intake               50 ml  Output                0 ml  Net  50 ml   Filed Weights   05/22/16 1820 05/22/16 2127 05/23/16 0500  Weight: (!) 139.6 kg (307 lb 12.2 oz) 58.1 kg (128 lb) 58.1 kg (128 lb 1.4 oz)    Examination:  General exam:  Adult female, obese.  Sleepy but  arousable.   HEENT:  NCAT, MMM Respiratory system: Clear to auscultation bilaterally Cardiovascular system: Regular rate and rhythm, normal S1/S2. No murmurs, rubs, gallops or clicks.  Warm extremities Gastrointestinal system: Normal active bowel sounds, soft, nondistended, nontender. MSK:  Normal tone and bulk, legs appear wrinkled as if previous swelling has improved.  Neuro:  Diffusely weak, decreased muscle tone and bulk Skin:  Wounds clean and dry.  Data Reviewed: I have personally reviewed following labs and imaging studies  CBC:  Recent Labs Lab 05/19/16 1559 05/20/16 0647 05/21/16 0721 05/22/16 0403 05/23/16 0503  WBC 13.9* 13.5* 14.3* 13.7* 12.8*  NEUTROABS 10.1*  --   --   --   --   HGB 8.8* 8.7* 9.2* 8.4* 8.4*  HCT 27.9* 27.9* 29.7* 27.8* 27.4*  MCV 87.7 88.3 88.4 88.8 88.7  PLT 356 349 390 427* 323   Basic Metabolic Panel:  Recent Labs Lab 05/17/16 0926 05/20/16 0647 05/21/16 0721 05/22/16 0403 05/23/16 0503  NA 135 133* 134* 136 132*  K 3.9 4.6 4.5 4.2 3.7  CL 95* 93* 96* 93* 95*  CO2 25 23 21* 24 22  GLUCOSE 206* 185* 198* 200* 153*  BUN 52* 68* 45* 65* 29*  CREATININE 4.38* 5.63* 4.22* 5.03* 3.02*  CALCIUM 9.2 9.1 9.2 9.3 8.7*  PHOS 4.1 4.9* 5.6* 6.3* 4.1   GFR: Estimated Creatinine Clearance: 22.9 mL/min (A) (by C-G formula based on SCr of 3.02 mg/dL (H)). Liver Function Tests:  Recent Labs Lab 05/17/16 0926 05/20/16 0647 05/21/16 0721 05/22/16 0403 05/23/16 0503  ALBUMIN 2.0* 2.0* 2.3* 2.0* 1.9*   No results for input(s): LIPASE, AMYLASE in the last 168 hours. No results for input(s): AMMONIA in the last 168 hours. Coagulation Profile: No results for input(s): INR, PROTIME in the last 168 hours. Cardiac Enzymes:  Recent Labs Lab 05/19/16 1559  CKTOTAL 62   BNP (last 3 results) No results for input(s): PROBNP in the last 8760 hours. HbA1C: No results for input(s): HGBA1C in the last 72 hours. CBG:  Recent Labs Lab  05/22/16 0809 05/22/16 1203 05/22/16 1912 05/22/16 2135 05/23/16 0747  GLUCAP 195* 225* 123* 132* 165*   Lipid Profile: No results for input(s): CHOL, HDL, LDLCALC, TRIG, CHOLHDL, LDLDIRECT in the last 72 hours. Thyroid Function Tests: No results for input(s): TSH, T4TOTAL, FREET4, T3FREE, THYROIDAB in the last 72 hours. Anemia Panel: No results for input(s): VITAMINB12, FOLATE, FERRITIN, TIBC, IRON, RETICCTPCT in the last 72 hours. Urine analysis:    Component Value Date/Time   COLORURINE AMBER (A) 05/16/2016 1142   APPEARANCEUR TURBID (A) 05/16/2016 1142   LABSPEC 1.019 05/16/2016 1142   PHURINE 5.0 05/16/2016 1142   GLUCOSEU NEGATIVE 05/16/2016 1142   HGBUR MODERATE (A) 05/16/2016 1142   BILIRUBINUR NEGATIVE 05/16/2016 1142   KETONESUR NEGATIVE 05/16/2016 1142   PROTEINUR 100 (A) 05/16/2016 1142   UROBILINOGEN 0.2 10/26/2013 1721   NITRITE NEGATIVE 05/16/2016 1142   LEUKOCYTESUR MODERATE (A) 05/16/2016 1142    Recent Results (from the past 240 hour(s))  Culture, blood (Routine X 2) w Reflex to ID Panel     Status: None   Collection Time: 05/15/16  9:03 PM  Result Value Ref Range Status   Specimen  Description BLOOD RIGHT HAND  Final   Special Requests   Final    BOTTLES DRAWN AEROBIC ONLY Blood Culture adequate volume   Culture NO GROWTH 6 DAYS  Final   Report Status 05/21/2016 FINAL  Final  Culture, blood (Routine X 2) w Reflex to ID Panel     Status: None   Collection Time: 05/15/16  9:10 PM  Result Value Ref Range Status   Specimen Description BLOOD RIGHT HAND  Final   Special Requests IN PEDIATRIC BOTTLE Blood Culture adequate volume  Final   Culture NO GROWTH 6 DAYS  Final   Report Status 05/21/2016 FINAL  Final  Culture, Urine     Status: None   Collection Time: 05/16/16 11:42 AM  Result Value Ref Range Status   Specimen Description URINE, RANDOM  Final   Special Requests NONE  Final   Culture NO GROWTH  Final   Report Status 05/17/2016 FINAL  Final   Culture, blood (Routine X 2) w Reflex to ID Panel     Status: None (Preliminary result)   Collection Time: 05/19/16  3:59 PM  Result Value Ref Range Status   Specimen Description BLOOD RIGHT HAND  Final   Special Requests IN PEDIATRIC BOTTLE Blood Culture adequate volume  Final   Culture NO GROWTH 3 DAYS  Final   Report Status PENDING  Incomplete  Culture, blood (Routine X 2) w Reflex to ID Panel     Status: None (Preliminary result)   Collection Time: 05/19/16  3:59 PM  Result Value Ref Range Status   Specimen Description BLOOD LEFT HAND  Final   Special Requests IN PEDIATRIC BOTTLE Blood Culture adequate volume  Final   Culture NO GROWTH 3 DAYS  Final   Report Status PENDING  Incomplete  Surgical pcr screen     Status: None   Collection Time: 05/21/16  1:27 AM  Result Value Ref Range Status   MRSA, PCR NEGATIVE NEGATIVE Final   Staphylococcus aureus NEGATIVE NEGATIVE Final    Comment:        The Xpert SA Assay (FDA approved for NASAL specimens in patients over 70 years of age), is one component of a comprehensive surveillance program.  Test performance has been validated by [facility] for patients greater than or equal to 60 year old. It is not intended to diagnose infection nor to guide or monitor treatment.   Aerobic/Anaerobic Culture (surgical/deep wound)     Status: None (Preliminary result)   Collection Time: 05/21/16  9:35 AM  Result Value Ref Range Status   Specimen Description WOUND LEFT THIGH  Final   Special Requests NONE  Final   Gram Stain   Final    RARE WBC PRESENT, PREDOMINANTLY PMN FEW GRAM POSITIVE RODS    Culture CULTURE REINCUBATED FOR BETTER GROWTH  Final   Report Status PENDING  Incomplete   Radiology Studies: No results found.  Scheduled Meds: . albuterol  3 mL Inhalation Q M,W,F-HD  . atorvastatin  20 mg Oral Daily  . darbepoetin (ARANESP) injection - DIALYSIS  150 mcg Intravenous Q Fri-HD  . feeding supplement (NEPRO CARB STEADY)  237 mL  Oral TID BM  . feeding supplement (PRO-STAT SUGAR FREE 64)  30 mL Oral BID  . ferric gluconate (FERRLECIT/NULECIT) IV  125 mg Intravenous Q M,W,F-HD  . gi cocktail  30 mL Oral Once  . heparin  5,000 Units Subcutaneous Q8H  . insulin aspart  0-5 Units Subcutaneous QHS  . insulin aspart  0-9 Units Subcutaneous TID WC  . insulin aspart  3 Units Subcutaneous TID WC  . insulin glargine  10 Units Subcutaneous QHS  . mouth rinse  15 mL Mouth Rinse BID  . multivitamin  1 tablet Oral QHS  . piperacillin-tazobactam (ZOSYN)  IV  3.375 g Intravenous Q12H  . polyethylene glycol  17 g Oral BID  . senna-docusate  1 tablet Oral BID  . sevelamer carbonate  800 mg Oral TID WC  . vancomycin  1,000 mg Intravenous Q M,W,F-HD   Continuous Infusions:   LOS: 22 days   Time spent: 23 min  Irwin Brakeman, MD [facility] Pager [phone]  If 7PM-7AM, please contact night-coverage [email or web address] Password [facility] 05/23/2016, 11:31 AM

## 2016-05-23 NOTE — Progress Notes (Signed)
Physical Therapy Treatment Patient Details Name: [name] MRN: [id number] DOB: [birth year] Today's Date: 05/23/2016    History of Present Illness 41 y.o. female with medical history significant of morbid obesity, chronic kidney disease stage IV-V, diabetes mellitus, hypertension, prior CVA, diastolic CHF, presents to the emergency room with chief complaint of weakness as well as fever and diagnosed with acute hypoxic resp failure with acute encephalopathy. S/p L AV graft on 05/14/16.     PT Comments    Limited by low arousal level.  Rolling for pericare and positioning of lift pad.  Positioned pt in chair well for work on sitting tolerance.   Follow Up Recommendations  SNF;Supervision/Assistance - 24 hour     Equipment Recommendations  Hospital bed;Wheelchair (measurements PT);Wheelchair cushion (measurements PT);Other (comment)    Recommendations for Other Services       Precautions / Restrictions Precautions Precautions: Fall Precaution Comments: bil inner thigh wounds that were surgically debrided in OR    Mobility  Bed Mobility Overal bed mobility: Needs Assistance Bed Mobility: Rolling Rolling: Max assist;+2 for physical assistance         General bed mobility comments: rolled multiple times for extensive pericare then lift pad placement.  Pt has recently been able to help more at times, but little to no assist lately  Transfers Overall transfer level: Needs assistance               General transfer comment: 2 person assist to prep lift for transfer to recliner and support and position pt for safety  Ambulation/Gait                 Stairs            Wheelchair Mobility    Modified Rankin (Stroke Patients Only)       Balance Overall balance assessment: Needs assistance Sitting-balance support: Bilateral upper extremity supported;No upper extremity supported Sitting balance-Leahy Scale: Poor Sitting balance - Comments: little  tone, lean R or L, but generally falls right unless positioned.                                    Cognition Arousal/Alertness: Lethargic Behavior During Therapy: Flat affect Overall Cognitive Status: Difficult to assess                                 General Comments: Pt continues to exhibit low motivation to participate with PT.       Exercises      General Comments General comments (skin integrity, edema, etc.): pt unable to attain an arousal level to participate therapeutically      Pertinent Vitals/Pain Pain Assessment: Faces Faces Pain Scale: Hurts little more Pain Location: BLE Pain Descriptors / Indicators: Moaning;Sore Pain Intervention(s): Repositioned;Monitored during session;Limited activity within patient's tolerance    Home Living                      Prior Function            PT Goals (current goals can now be found in the care plan section) Acute Rehab PT Goals Patient Stated Goal: did not state during OT eval PT Goal Formulation: With patient Time For Goal Achievement: 06/05/16 Potential to Achieve Goals: Fair Progress towards PT goals: Not progressing toward goals - comment;Goals downgraded-see care plan (arousal level not  therapeutic)    Frequency    Min 3X/week      PT Plan Current plan remains appropriate    Co-evaluation             End of Session   Activity Tolerance: Patient limited by lethargy Patient left: in chair;with call bell/phone within reach;with chair alarm set Nurse Communication: Mobility status;Need for lift equipment PT Visit Diagnosis: Muscle weakness (generalized) (M62.81);Adult, failure to thrive (R62.7)     Time: 1147-1229 PT Time Calculation (min) (ACUTE ONLY): 42 min  Charges:  $Therapeutic Activity: 8-22 mins $Self Care/Home Management: 23-37                    G Codes:       06/19/2016  [place], PT [phone]  (pager)   Eliseo Gum  Rhya Shan 07/11/2016, 12:54 PM

## 2016-05-23 NOTE — Care Management Note (Signed)
Case Management Note  Patient Details  Name: [name] MRN: [id number] Date of Birth: 1975-12-18  Subjective/Objective:     CM following for progression and d/c planning.                Action/Plan: 05/23/2016 Noted pt slow progression with increased lethargy and weakness. Wet to dry dressings post op with possible plan for VAC. CM following for d/c needs. SNF discussed with pt on Friday, 4/6, 2018 as only option as we are unable to obtain DME due to pt lost of last equipment. Additionally in family meeting with this CM, pt primary caregive, her sister states that she can not provide the care needed, however pt insist that family can provide care. Pt suggest that husband can provide care, however this CM noted that he has an injury to his rt wrist/arm.  Pt is unable to support herself in any manor or provide any of her own personal care.  Noted Pall consult of 05/22/2016 suggested that pt needs to stay in bed for HD and given pt post op status yesterday 05/22/2016 this seemed appropriate, however we can not continue to dialyize this pt in the bed or she will be unable to d/c from the hospital. She must be able to be transferred to a wheelchair and tol sitting in order to qualify for outpt hemodialysis. Pall Care staff notified of this need.     Expected Discharge Date:  05/17/16               Expected Discharge Plan:  Skilled Nursing Facility  In-House Referral:  Clinical Social Work  Discharge planning Services  CM Consult  Post Acute Care Choice:    Choice offered to:     DME Arranged:   Michiel Sites lift) DME Agency:     HH Arranged:    HH Agency:  [facility]  Status of Service:  In process, will continue to follow  If discussed at Long Length of Stay Meetings, dates discussed:    Additional Comments:  Starlyn Skeans, RN 05/23/2016, 11:12 AM

## 2016-05-24 ENCOUNTER — Inpatient Hospital Stay (HOSPITAL_COMMUNITY): Payer: Medicaid Other

## 2016-05-24 DIAGNOSIS — E1169 Type 2 diabetes mellitus with other specified complication: Secondary | ICD-10-CM

## 2016-05-24 DIAGNOSIS — I6930 Unspecified sequelae of cerebral infarction: Secondary | ICD-10-CM

## 2016-05-24 DIAGNOSIS — E785 Hyperlipidemia, unspecified: Secondary | ICD-10-CM

## 2016-05-24 LAB — CBC
HCT: 26.8 % — ABNORMAL LOW (ref 36.0–46.0)
HEMOGLOBIN: 8.2 g/dL — AB (ref 12.0–15.0)
MCH: 27 pg (ref 26.0–34.0)
MCHC: 30.6 g/dL (ref 30.0–36.0)
MCV: 88.2 fL (ref 78.0–100.0)
Platelets: 376 10*3/uL (ref 150–400)
RBC: 3.04 MIL/uL — ABNORMAL LOW (ref 3.87–5.11)
RDW: 16 % — ABNORMAL HIGH (ref 11.5–15.5)
WBC: 14 10*3/uL — ABNORMAL HIGH (ref 4.0–10.5)

## 2016-05-24 LAB — RENAL FUNCTION PANEL
Albumin: 2 g/dL — ABNORMAL LOW (ref 3.5–5.0)
Albumin: 1.8 g/dL — ABNORMAL LOW (ref 3.5–5.0)
Anion gap: 16 — ABNORMAL HIGH (ref 5–15)
Anion gap: 17 — ABNORMAL HIGH (ref 5–15)
BUN: 49 mg/dL — ABNORMAL HIGH (ref 6–20)
BUN: 50 mg/dL — AB (ref 6–20)
CALCIUM: 9 mg/dL (ref 8.9–10.3)
CHLORIDE: 94 mmol/L — AB (ref 101–111)
CO2: 24 mmol/L (ref 22–32)
CO2: 23 mmol/L (ref 22–32)
Calcium: 9 mg/dL (ref 8.9–10.3)
Chloride: 94 mmol/L — ABNORMAL LOW (ref 101–111)
Creatinine, Ser: 4.38 mg/dL — ABNORMAL HIGH (ref 0.44–1.00)
Creatinine, Ser: 4.47 mg/dL — ABNORMAL HIGH (ref 0.44–1.00)
GFR calc Af Amer: 14 mL/min — ABNORMAL LOW (ref >60)
GFR calc non Af Amer: 12 mL/min — ABNORMAL LOW (ref >60)
GFR, EST AFRICAN AMERICAN: 13 mL/min — AB (ref >60)
GFR, EST NON AFRICAN AMERICAN: 11 mL/min — AB (ref >60)
GLUCOSE: 195 mg/dL — AB (ref 65–99)
Glucose, Bld: 203 mg/dL — ABNORMAL HIGH (ref 65–99)
POTASSIUM: 3.9 mmol/L (ref 3.5–5.1)
Phosphorus: 5.6 mg/dL — ABNORMAL HIGH (ref 2.5–4.6)
Phosphorus: 5.5 mg/dL — ABNORMAL HIGH (ref 2.5–4.6)
Potassium: 4.2 mmol/L (ref 3.5–5.1)
SODIUM: 134 mmol/L — AB (ref 135–145)
Sodium: 134 mmol/L — ABNORMAL LOW (ref 135–145)

## 2016-05-24 LAB — GLUCOSE, CAPILLARY
GLUCOSE-CAPILLARY: 140 mg/dL — AB (ref 65–99)
GLUCOSE-CAPILLARY: 151 mg/dL — AB (ref 65–99)
Glucose-Capillary: 161 mg/dL — ABNORMAL HIGH (ref 65–99)

## 2016-05-24 LAB — CULTURE, BLOOD (ROUTINE X 2)
CULTURE: NO GROWTH
Culture: NO GROWTH
SPECIAL REQUESTS: ADEQUATE
SPECIAL REQUESTS: ADEQUATE

## 2016-05-24 MED ORDER — HEPARIN SODIUM (PORCINE) 1000 UNIT/ML DIALYSIS: 1000.0000 [IU] | INTRAMUSCULAR | Status: DC | PRN

## 2016-05-24 MED ORDER — LIDOCAINE-PRILOCAINE 2.5-2.5 % EX CREA: 1.0000 "application " | TOPICAL_CREAM | CUTANEOUS | Status: DC | PRN

## 2016-05-24 MED ORDER — ALTEPLASE 2 MG IJ SOLR: 2.0000 mg | Freq: Once | INTRAMUSCULAR | Status: DC | PRN

## 2016-05-24 MED ORDER — VANCOMYCIN HCL IN DEXTROSE 1-5 GM/200ML-% IV SOLN
INTRAVENOUS | Status: AC
  Administered 2016-05-24: 1000 mg via INTRAVENOUS
  Filled 2016-05-24: qty 200

## 2016-05-24 MED ORDER — SODIUM CHLORIDE 0.9 % IV SOLN: 100.0000 mL | INTRAVENOUS | Status: DC | PRN

## 2016-05-24 MED ORDER — HEPARIN SODIUM (PORCINE) 1000 UNIT/ML DIALYSIS: 20.0000 [IU]/kg | INTRAMUSCULAR | Status: DC | PRN

## 2016-05-24 MED ORDER — DARBEPOETIN ALFA 150 MCG/0.3ML IJ SOSY
PREFILLED_SYRINGE | INTRAMUSCULAR | Status: AC
  Administered 2016-05-24: 150 ug via INTRAVENOUS
  Filled 2016-05-24: qty 0.3

## 2016-05-24 MED ORDER — PENTAFLUOROPROP-TETRAFLUOROETH EX AERO: 1.0000 "application " | INHALATION_SPRAY | CUTANEOUS | Status: DC | PRN

## 2016-05-24 MED ORDER — LIDOCAINE HCL (PF) 1 % IJ SOLN
5.0000 mL | INTRAMUSCULAR | Status: DC | PRN
  Filled 2016-05-24: qty 5

## 2016-05-24 NOTE — Progress Notes (Signed)
PROGRESS NOTE  [name]  OZD:[id number] DOB: 09-15-1975 DOA: 05/01/2016 PCP: Marletta Lor, NP  Brief Narrative:  41 y.o.femalewith medical history significant formorbid obesity, chronic kidney disease stage IV-V, diabetes mellitus, hypertension, prior CVA, diastolic CHF who presented to ED with progressive weakness, fever for past 1 week prior to this admission. She is actually bed bound for past year or so per her husband report ever since stroke. She also had dry cough and shortness of breath for past 1 week.  Her creatinine was 7.9 (much worse since her recent Cr values of 4-5).  She was started on hemodialysis and underwent left upper arm AV graft placement on 4/3.  She was anticipating going home with her family but because she is too weak to assist with transfers and is morbidly obese, she would need a hoyer lift, hospital bed, and wheelchair for transport to dialysis.  Unfortunately, she was given a hospital bed last year which was "lost" and no home health agencies in the region will provide her with equipment.  She was told that she will need to go to SNF for short term rehabilitation to regain enough strength to at least transfer from a low bed/mattress to a chair in order to get to HD.  She has been having intermittent fevers without defined source.  Her fevers appeared to get better with cessation of antibiotics, however, they returned.  This may be due to a gangrenous area on the posterior left thigh which was debrided by general surgery on 4/10 in the OR.  As part of her FUO work up, however, she was also found to be ANA positive with a high scl-70 titer.  I speculate that maybe her bilateral leg ulcers, skin gangrene, renal failure, fatigue/weakness, and intermittent fevers may be due to autoimmune disease.  I called [facility] and left a message for a provider to call me back to discuss her case.    Assessment & Plan:   Principal Problem:   Acute metabolic  encephalopathy Active Problems:   Morbid obesity (HCC)   Essential hypertension   History of CVA with residual deficit   Anemia of chronic disease   Lymphedema   Debility   Acute pulmonary edema (HCC)   Acute renal failure superimposed on stage 5 chronic kidney disease, not on chronic dialysis (HCC)   Acute respiratory failure with hypoxia (HCC)   Right middle lobe pneumonia (HCC)   Leukocytosis   Uncontrolled diabetes mellitus with diabetic nephropathy, with long-term current use of insulin (HCC)   Dyslipidemia associated with type 2 diabetes mellitus (HCC)   ESRD (end stage renal disease) (HCC)   Goals of care, counseling/discussion   Palliative care by specialist  ESRD with acute pulmonary edema -  Patient was initially placed on aggressive IV Lasix and metolazone and progressed to ESRD - Tunneled dialysis cath Placed on 3/27. Hemodialysis initiated 3/28, then 3/29, HD 4/1, HD 4/2 - Accepted at [facility] [address]  - Vascular surgery also following, AV graft placement 4/3  Recurrent fever could be due to gangrenous left posterior leg ulcer -  She had coag negative staph from 1/2 blood cultures on 3/21 which was felt to be contaminant.  Other cultures are so far negative.   -  CT scan of chest was reviewed, from 3/22:  No evidence of abscess or source of fever -  Recent CXR and urine culture have been negative -  Repeat blood culture:  NGTD -  Appreciate wound care  assistance -  General surgery consulted > s/p OR debridement 4/10 under general anesthesia -  Resume vancomycin and zosyn, wound care per surgery team, zosyn dc by surgery 4/13 -  Surgery ordered for wound vac 4/13  Morbid obesity / Debility/ generalized weakness.  Unable to assist with transfers -  No home health agency will provide equipment and services to home because of lost equipment in the past -  SW consulted for SNF -  Nutrition consulted -  Body mass index is 54.58 kg/m. -  Wt has gone  down about 70-lbs since admission  Acute respiratory failure with hypoxia with possible right middle lobe pneumonia vs. Atelectasis on CXR.  Persistent fevers, leukocytosis despite more than a week of doxycycline and the addition of ceftaz    Acute metabolic encephalopathy - less alert than baseline mental status per family - Likely multifactorial from history of CVA, end stage renal disease, possible uremia, infectious process - Had a discussion with palliative care team, they were concerned about opioid effects and recommending trial of naloxone x 1 dose which was ordered but not much improvement.  4/13: still lethargic and minimally responsive, will order CT head  Chest pain episode  -  Troponin 1 was 0.04 however d-dimer was 2.01 - VQ scan Negative for any PE  Anemia of chronic disease / IDA - Status post 2 units PRBC transfusion on 05/02/2016  - Aranesp 3/22, continue iron supplementation, B12  Uncontrolled diabetes mellitus with diabetic nephropathy with long term insulin use, hyperglycemic - continue lantus 10 units - start aspart 3 units TIC AC -  Continue low dose SSI  CBG (last 3)   Recent Labs  05/23/16 1150 05/23/16 1615 05/23/16 2114  GLUCAP 182* 200* 191*   Dyslipidemia associated with type 2 DM - Continue statin therapy   Essential hypertension, blood pressures low normal despite holding BP medications  Bilateral inner thigh wounds, left leg developed foul odor and area of central gangrene.     - Related to moisture and friction from thighs rubbing together -  Appreciate general surgery assistance, debrided on 4/10  DVT prophylaxis:  heparin Code Status:  full Family Communication:  Patient alone, spoke with sister and husband 4/11 Disposition Plan:   SNF.     Consultants:   Nephrology  WOC  Palliative care   Nutrition  Vascular surgery   Procedures:  Hemodialysis initiated 3/28  Antimicrobials:  Anti-infectives    Start      Dose/Rate Route Frequency Ordered Stop   05/22/16 1200  vancomycin (VANCOCIN) 1,250 mg in sodium chloride 0.9 % 250 mL IVPB  Status:  Discontinued     1,250 mg 250 mL/hr over 60 Minutes Intravenous Every M-W-F (Hemodialysis) 05/20/16 1024 05/20/16 1027   05/22/16 1200  vancomycin (VANCOCIN) IVPB 1000 mg/200 mL premix     1,000 mg 200 mL/hr over 60 Minutes Intravenous Every M-W-F (Hemodialysis) 05/20/16 1027     05/20/16 1200  vancomycin (VANCOCIN) 2,250 mg in sodium chloride 0.9 % 250 mL IVPB     2,250 mg 250 mL/hr over 60 Minutes Intravenous Every Mon (Hemodialysis) 05/20/16 1024 05/20/16 1513   05/20/16 1030  piperacillin-tazobactam (ZOSYN) IVPB 3.375 g     3.375 g 12.5 mL/hr over 240 Minutes Intravenous Every 12 hours 05/20/16 1012     05/16/16 1630  cefTRIAXone (ROCEPHIN) 1 g in dextrose 5 % 50 mL IVPB  Status:  Discontinued     1 g 100 mL/hr over 30 Minutes Intravenous Every 24  hours 05/16/16 1625 05/17/16 1641   05/14/16 1430  cefTAZidime (FORTAZ) 1 g in dextrose 5 % 50 mL IVPB  Status:  Discontinued     1 g 100 mL/hr over 30 Minutes Intravenous Every 24 hours 05/14/16 1406 05/15/16 1325   05/14/16 1430  doxycycline (VIBRA-TABS) tablet 100 mg  Status:  Discontinued     100 mg Oral Every 12 hours 05/14/16 1406 05/15/16 1325   05/14/16 0000  ceFAZolin (ANCEF) IVPB 1 g/50 mL premix  Status:  Discontinued    Comments:  Send with pt to OR   1 g 100 mL/hr over 30 Minutes Intravenous On call 05/13/16 1630 05/15/16 1325   05/12/16 1330  cefTAZidime (FORTAZ) 1 g in dextrose 5 % 50 mL IVPB  Status:  Discontinued     1 g 100 mL/hr over 30 Minutes Intravenous Every 24 hours 05/12/16 1319 05/14/16 1406   05/08/16 1000  vancomycin (VANCOCIN) IVPB 1000 mg/200 mL premix     1,000 mg 200 mL/hr over 60 Minutes Intravenous To Surgery 05/07/16 1904 05/08/16 1004   05/08/16 0902  vancomycin (VANCOCIN) 1-5 GM/200ML-% IVPB    Comments:  Matilde Haymaker, [name]   : cabinet override      05/08/16 0902  05/08/16 0904   05/07/16 1300  ceFAZolin (ANCEF) 3 g in dextrose 5 % 50 mL IVPB     3 g 130 mL/hr over 30 Minutes Intravenous To Radiology 05/07/16 1232 05/07/16 1330   05/02/16 2200  doxycycline (VIBRA-TABS) tablet 100 mg  Status:  Discontinued     100 mg Oral Every 12 hours 05/02/16 1732 05/14/16 1406   05/01/16 2200  piperacillin-tazobactam (ZOSYN) IVPB 2.25 g  Status:  Discontinued     2.25 g 100 mL/hr over 30 Minutes Intravenous Every 8 hours 05/01/16 1324 05/02/16 1729   05/01/16 1330  vancomycin (VANCOCIN) 2,500 mg in sodium chloride 0.9 % 500 mL IVPB     2,500 mg 250 mL/hr over 120 Minutes Intravenous  Once 05/01/16 1309 05/01/16 1551   05/01/16 1300  piperacillin-tazobactam (ZOSYN) IVPB 3.375 g     3.375 g 100 mL/hr over 30 Minutes Intravenous  Once 05/01/16 1258 05/01/16 1340   05/01/16 1300  vancomycin (VANCOCIN) IVPB 1000 mg/200 mL premix  Status:  Discontinued     1,000 mg 200 mL/hr over 60 Minutes Intravenous  Once 05/01/16 1258 05/01/16 1308     Subjective: Lethargic but arousable  Objective: Vitals:   05/23/16 1300 05/23/16 1747 05/23/16 2113 05/24/16 0513  BP: 140/64 119/72 118/63 (!) 121/55  Pulse: (!) 105 98 100 96  Resp:  16 16 16   Temp:  98.6 F (37 C) 98.3 F (36.8 C) 99.3 F (37.4 C)  TempSrc:  Oral Oral Oral  SpO2: 100% 100% 95% 95%  Weight:      Height:        Intake/Output Summary (Last 24 hours) at 05/24/16 0830 Last data filed at 05/24/16 0601  Gross per 24 hour  Intake              303 ml  Output                0 ml  Net              303 ml   Filed Weights   05/22/16 1400 05/22/16 1820 05/22/16 2127  Weight: (!) 139.6 kg (307 lb 12.2 oz) (!) 139.6 kg (307 lb 12.2 oz) 58.1 kg (128 lb)    Examination:  General  exam:  Adult female, obese.  Sleepy but arousable.   HEENT:  NCAT, MMM Respiratory system: Clear to auscultation bilaterally Cardiovascular system: Regular rate and rhythm, normal S1/S2. No murmurs, rubs, gallops or clicks.   Warm extremities Gastrointestinal system: Normal active bowel sounds, soft, nondistended, nontender. MSK:  Normal tone and bulk, legs appear wrinkled as if previous swelling has improved.  Neuro:  Diffusely weak, decreased muscle tone and bulk Skin:  Wounds clean and dry.  Data Reviewed: I have personally reviewed following labs and imaging studies  CBC:  Recent Labs Lab 05/19/16 1559 05/20/16 0647 05/21/16 0721 05/22/16 0403 05/23/16 0503 05/24/16 0451  WBC 13.9* 13.5* 14.3* 13.7* 12.8* 14.0*  NEUTROABS 10.1*  --   --   --   --   --   HGB 8.8* 8.7* 9.2* 8.4* 8.4* 8.2*  HCT 27.9* 27.9* 29.7* 27.8* 27.4* 26.8*  MCV 87.7 88.3 88.4 88.8 88.7 88.2  PLT 356 349 390 427* 374 376   Basic Metabolic Panel:  Recent Labs Lab 05/21/16 0721 05/22/16 0403 05/23/16 0503 05/24/16 0452 05/24/16 0730  NA 134* 136 132* 134* 134*  K 4.5 4.2 3.7 4.2 3.9  CL 96* 93* 95* 94* 94*  CO2 21* GLUCOSE 198* 200* 153* 195* 203*  BUN 45* 65* 29* 49* 50*  CREATININE 4.22* 5.03* 3.02* 4.38* 4.47*  CALCIUM 9.2 9.3 8.7* 9.0 9.0  PHOS 5.6* 6.3* 4.1 5.6* 5.5*   GFR: Estimated Creatinine Clearance: 15.5 mL/min (A) (by C-G formula based on SCr of 4.47 mg/dL (H)). Liver Function Tests:  Recent Labs Lab 05/21/16 0721 05/22/16 0403 05/23/16 0503 05/24/16 0452 05/24/16 0730  ALBUMIN 2.3* 2.0* 1.9* 2.0* 1.8*   No results for input(s): LIPASE, AMYLASE in the last 168 hours. No results for input(s): AMMONIA in the last 168 hours. Coagulation Profile: No results for input(s): INR, PROTIME in the last 168 hours. Cardiac Enzymes:  Recent Labs Lab 05/19/16 1559  CKTOTAL 62   BNP (last 3 results) No results for input(s): PROBNP in the last 8760 hours. HbA1C: No results for input(s): HGBA1C in the last 72 hours. CBG:  Recent Labs Lab 05/22/16 2135 05/23/16 0747 05/23/16 1150 05/23/16 1615 05/23/16 2114  GLUCAP 132* 165* 182* 200* 191*   Lipid Profile: No results for  input(s): CHOL, HDL, LDLCALC, TRIG, CHOLHDL, LDLDIRECT in the last 72 hours. Thyroid Function Tests: No results for input(s): TSH, T4TOTAL, FREET4, T3FREE, THYROIDAB in the last 72 hours. Anemia Panel: No results for input(s): VITAMINB12, FOLATE, FERRITIN, TIBC, IRON, RETICCTPCT in the last 72 hours. Urine analysis:    Component Value Date/Time   COLORURINE AMBER (A) 05/16/2016 1142   APPEARANCEUR TURBID (A) 05/16/2016 1142   LABSPEC 1.019 05/16/2016 1142   PHURINE 5.0 05/16/2016 1142   GLUCOSEU NEGATIVE 05/16/2016 1142   HGBUR MODERATE (A) 05/16/2016 1142   BILIRUBINUR NEGATIVE 05/16/2016 1142   KETONESUR NEGATIVE 05/16/2016 1142   PROTEINUR 100 (A) 05/16/2016 1142   UROBILINOGEN 0.2 10/26/2013 1721   NITRITE NEGATIVE 05/16/2016 1142   LEUKOCYTESUR MODERATE (A) 05/16/2016 1142    Recent Results (from the past 240 hour(s))  Culture, blood (Routine X 2) w Reflex to ID Panel     Status: None   Collection Time: 05/15/16  9:03 PM  Result Value Ref Range Status   Specimen Description BLOOD RIGHT HAND  Final   Special Requests   Final    BOTTLES DRAWN AEROBIC ONLY Blood Culture adequate volume   Culture NO GROWTH  6 DAYS  Final   Report Status 05/21/2016 FINAL  Final  Culture, blood (Routine X 2) w Reflex to ID Panel     Status: None   Collection Time: 05/15/16  9:10 PM  Result Value Ref Range Status   Specimen Description BLOOD RIGHT HAND  Final   Special Requests IN PEDIATRIC BOTTLE Blood Culture adequate volume  Final   Culture NO GROWTH 6 DAYS  Final   Report Status 05/21/2016 FINAL  Final  Culture, Urine     Status: None   Collection Time: 05/16/16 11:42 AM  Result Value Ref Range Status   Specimen Description URINE, RANDOM  Final   Special Requests NONE  Final   Culture NO GROWTH  Final   Report Status 05/17/2016 FINAL  Final  Culture, blood (Routine X 2) w Reflex to ID Panel     Status: None (Preliminary result)   Collection Time: 05/19/16  3:59 PM  Result Value Ref  Range Status   Specimen Description BLOOD RIGHT HAND  Final   Special Requests IN PEDIATRIC BOTTLE Blood Culture adequate volume  Final   Culture NO GROWTH 4 DAYS  Final   Report Status PENDING  Incomplete  Culture, blood (Routine X 2) w Reflex to ID Panel     Status: None (Preliminary result)   Collection Time: 05/19/16  3:59 PM  Result Value Ref Range Status   Specimen Description BLOOD LEFT HAND  Final   Special Requests IN PEDIATRIC BOTTLE Blood Culture adequate volume  Final   Culture NO GROWTH 4 DAYS  Final   Report Status PENDING  Incomplete  Surgical pcr screen     Status: None   Collection Time: 05/21/16  1:27 AM  Result Value Ref Range Status   MRSA, PCR NEGATIVE NEGATIVE Final   Staphylococcus aureus NEGATIVE NEGATIVE Final    Comment:        The Xpert SA Assay (FDA approved for NASAL specimens in patients over 34 years of age), is one component of a comprehensive surveillance program.  Test performance has been validated by [facility] for patients greater than or equal to 10 year old. It is not intended to diagnose infection nor to guide or monitor treatment.   Aerobic/Anaerobic Culture (surgical/deep wound)     Status: Abnormal (Preliminary result)   Collection Time: 05/21/16  9:35 AM  Result Value Ref Range Status   Specimen Description WOUND LEFT THIGH  Final   Special Requests NONE  Final   Gram Stain   Final    RARE WBC PRESENT, PREDOMINANTLY PMN FEW GRAM POSITIVE RODS    Culture MULTIPLE ORGANISMS PRESENT, NONE PREDOMINANT (A)  Final   Report Status PENDING  Incomplete   Radiology Studies: No results found.  Scheduled Meds: . albuterol  3 mL Inhalation Q M,W,F-HD  . atorvastatin  20 mg Oral Daily  . darbepoetin (ARANESP) injection - DIALYSIS  150 mcg Intravenous Q Fri-HD  . feeding supplement (NEPRO CARB STEADY)  237 mL Oral TID BM  . feeding supplement (PRO-STAT SUGAR FREE 64)  30 mL Oral BID  . ferric gluconate (FERRLECIT/NULECIT) IV  125 mg  Intravenous Q M,W,F-HD  . gi cocktail  30 mL Oral Once  . heparin  5,000 Units Subcutaneous Q8H  . insulin aspart  0-5 Units Subcutaneous QHS  . insulin aspart  0-9 Units Subcutaneous TID WC  . insulin aspart  3 Units Subcutaneous TID WC  . insulin glargine  10 Units Subcutaneous QHS  . mouth  rinse  15 mL Mouth Rinse BID  . multivitamin  1 tablet Oral QHS  . piperacillin-tazobactam (ZOSYN)  IV  3.375 g Intravenous Q12H  . polyethylene glycol  17 g Oral BID  . senna-docusate  1 tablet Oral BID  . sevelamer carbonate  800 mg Oral TID WC  . vancomycin  1,000 mg Intravenous Q M,W,F-HD   Continuous Infusions:   LOS: 23 days   Time spent: 22 min  Standley Dakins, MD [facility] Pager [phone]  If 7PM-7AM, please contact night-coverage [email or web address] Password [email or web address] 05/24/2016, 8:30 AM

## 2016-05-24 NOTE — Procedures (Signed)
Minimally responsive today.  Is on HD.  Stable hemodynamics.  Brain CT ordered by Dr. Laural Benes. Earlie Schank C

## 2016-05-24 NOTE — Progress Notes (Signed)
3 Days Post-Op  Subjective: On dialysis this Am.  Right wound looks fine with xeroform gauze in place.  Left thigh is OK, nothing on dressing in place.  Clean.    Objective: Vital signs in last 24 hours: Temp:  [98.3 F (36.8 C)-99.3 F (37.4 C)] 98.7 F (37.1 C) (04/13 0752) Pulse Rate:  [92-105] 98 (04/13 0900) Resp:  [16-24] 24 (04/13 0900) BP: (107-140)/(55-82) 115/73 (04/13 0900) SpO2:  [95 %-100 %] 96 % (04/13 0730) Last BM Date: 05/23/16 3 PO recorded 300 IV recorded  No urine, emesis, recorded BM x 1 Afebrile, VSS Labs stable  Intake/Output from previous day: 04/12 0701 - 04/13 0700 In: 303 [P.O.:3; IV Piggyback:300] Out: 0  Intake/Output this shift: No intake/output data recorded.  General appearance: sleeping on HD, did not really wake up even when I took dressings down. Skin: Skin color, texture, turgor normal. No rashes or lesions or left thigh would is clean, bovie changes at the base, but nothing that looks purulent.  Dressing in place is clean  Lab Results:   Recent Labs  05/23/16 0503 05/24/16 0451  WBC 12.8* 14.0*  HGB 8.4* 8.2*  HCT 27.4* 26.8*  PLT 374 376    BMET  Recent Labs  05/24/16 0452 05/24/16 0730  NA 134* 134*  K 4.2 3.9  CL 94* 94*  CO2 24 23  GLUCOSE 195* 203*  BUN 49* 50*  CREATININE 4.38* 4.47*  CALCIUM 9.0 9.0   PT/INR No results for input(s): LABPROT, INR in the last 72 hours.   Recent Labs Lab 05/21/16 0721 05/22/16 0403 05/23/16 0503 05/24/16 0452 05/24/16 0730  ALBUMIN 2.3* 2.0* 1.9* 2.0* 1.8*     Lipase  No results found for: LIPASE   Studies/Results: No results found.  Medications: . albuterol  3 mL Inhalation Q M,W,F-HD  . atorvastatin  20 mg Oral Daily  . Darbepoetin Alfa      . darbepoetin (ARANESP) injection - DIALYSIS  150 mcg Intravenous Q Fri-HD  . feeding supplement (NEPRO CARB STEADY)  237 mL Oral TID BM  . feeding supplement (PRO-STAT SUGAR FREE 64)  30 mL Oral BID  . ferric  gluconate (FERRLECIT/NULECIT) IV  125 mg Intravenous Q M,W,F-HD  . gi cocktail  30 mL Oral Once  . heparin  5,000 Units Subcutaneous Q8H  . insulin aspart  0-5 Units Subcutaneous QHS  . insulin aspart  0-9 Units Subcutaneous TID WC  . insulin aspart  3 Units Subcutaneous TID WC  . insulin glargine  10 Units Subcutaneous QHS  . mouth rinse  15 mL Mouth Rinse BID  . multivitamin  1 tablet Oral QHS  . piperacillin-tazobactam (ZOSYN)  IV  3.375 g Intravenous Q12H  . polyethylene glycol  17 g Oral BID  . senna-docusate  1 tablet Oral BID  . sevelamer carbonate  800 mg Oral TID WC  . vancomycin      . vancomycin  1,000 mg Intravenous Q M,W,F-HD    Assessment/Plan left thigh soft tissue infection s/p excision of 15x6x2.5cm area of skin and sub tissue Acute metabolic encephalopathy/Hx of CVA with residual deficit ESRD HD MWF, in chair. 30 kg down but still edema. Pulmonary edema/Acute respiratory failure - now resolved on room air HTN  AODM with poor control - diabetic neuropathy Anemia HPTH vit d Body mass index is 49.53 kg/m. Immobility Fevers undergoing eval Anxiety ZOX:WRUEA diet ID: Zosyn/Vancomycin per pharmacy 05/20/16 =>> day 5 (Multiple organism nothing predominate) DVT:  Heparin  Plan:  Ask  Wound care to place a wound vac.  We can look at it again on Monday with dressing changes.       LOS: 23 days    Kasem Mozer 05/24/2016 [phone]

## 2016-05-24 NOTE — Clinical Social Work Note (Signed)
CSW continuing to follow patient's progress and will facilitate discharge to [facility] when medically stable.  Genelle Bal, MSW, LCSW Licensed Clinical Social Worker Clinical Social Work Department [employer] [phone]

## 2016-05-24 NOTE — Consult Note (Signed)
WOC Nurse wound follow-up consult note Surgical team following for assessment and plan of care to left thigh wound. Reason for Consult: Requested to apply Vac to full thickness post-op wound Measurement: 16X5X4cm  Wound ONG:EXBMWU dry yellow adipose tissue, some red wound bed interspersed throughout Drainage (amount, consistency, odor) scant amt tan drainage, sone odor Periwound: Dry skin surrounding, making it difficult to maintain Vac drape seal Dressing procedure/placement/frequency: Applied 2 pieces black sponge to cont suction.  Pt tolerated with minimal discomfort; not very awake for the procedure and moaned slightly when area was touched. Surgical team plans to assess site on Monday with dressing change, according to progress notes. Cammie Mcgee MSN, RN, CWOCN, [place], CNS [phone]

## 2016-05-25 LAB — RENAL FUNCTION PANEL
ALBUMIN: 1.8 g/dL — AB (ref 3.5–5.0)
ANION GAP: 17 — AB (ref 5–15)
BUN: 26 mg/dL — ABNORMAL HIGH (ref 6–20)
CALCIUM: 9 mg/dL (ref 8.9–10.3)
CO2: 25 mmol/L (ref 22–32)
Chloride: 93 mmol/L — ABNORMAL LOW (ref 101–111)
Creatinine, Ser: 3.2 mg/dL — ABNORMAL HIGH (ref 0.44–1.00)
GFR calc non Af Amer: 17 mL/min — ABNORMAL LOW (ref >60)
GFR, EST AFRICAN AMERICAN: 20 mL/min — AB (ref >60)
Glucose, Bld: 178 mg/dL — ABNORMAL HIGH (ref 65–99)
PHOSPHORUS: 4.1 mg/dL (ref 2.5–4.6)
POTASSIUM: 3.4 mmol/L — AB (ref 3.5–5.1)
SODIUM: 135 mmol/L (ref 135–145)

## 2016-05-25 LAB — CBC
HEMATOCRIT: 26.5 % — AB (ref 36.0–46.0)
HEMOGLOBIN: 8.1 g/dL — AB (ref 12.0–15.0)
MCH: 27.1 pg (ref 26.0–34.0)
MCHC: 30.6 g/dL (ref 30.0–36.0)
MCV: 88.6 fL (ref 78.0–100.0)
Platelets: 377 10*3/uL (ref 150–400)
RBC: 2.99 MIL/uL — AB (ref 3.87–5.11)
RDW: 15.7 % — ABNORMAL HIGH (ref 11.5–15.5)
WBC: 14.5 10*3/uL — ABNORMAL HIGH (ref 4.0–10.5)

## 2016-05-25 LAB — GLUCOSE, CAPILLARY
GLUCOSE-CAPILLARY: 180 mg/dL — AB (ref 65–99)
Glucose-Capillary: 138 mg/dL — ABNORMAL HIGH (ref 65–99)
Glucose-Capillary: 215 mg/dL — ABNORMAL HIGH (ref 65–99)
Glucose-Capillary: 176 mg/dL — ABNORMAL HIGH (ref 65–99)

## 2016-05-25 LAB — VANCOMYCIN, RANDOM: Vancomycin Rm: 36

## 2016-05-25 MED ORDER — VANCOMYCIN HCL 500 MG IV SOLR: 500.0000 mg | INTRAVENOUS | Status: DC

## 2016-05-25 NOTE — Progress Notes (Signed)
PROGRESS NOTE  [name]  ZOX:[id number] DOB: [birth year] DOA: 05/01/2016 PCP: Marletta Lor, NP  Brief Narrative:  41 y.o.femalewith medical history significant formorbid obesity, chronic kidney disease stage IV-V, diabetes mellitus, hypertension, prior CVA, diastolic CHF who presented to ED with progressive weakness, fever for past 1 week prior to this admission. She is actually bed bound for past year or so per her husband report ever since stroke. She also had dry cough and shortness of breath for past 1 week.  Her creatinine was 7.9 (much worse since her recent Cr values of 4-5).  She was started on hemodialysis and underwent left upper arm AV graft placement on 4/3.  She was anticipating going home with her family but because she is too weak to assist with transfers and is morbidly obese, she would need a hoyer lift, hospital bed, and wheelchair for transport to dialysis.  Unfortunately, she was given a hospital bed last year which was "lost" and no home health agencies in the region will provide her with equipment.  She was told that she will need to go to SNF for short term rehabilitation to regain enough strength to at least transfer from a low bed/mattress to a chair in order to get to HD.  She has been having intermittent fevers without defined source.  Her fevers appeared to get better with cessation of antibiotics, however, they returned.  This may be due to a gangrenous area on the posterior left thigh which was debrided by general surgery on 4/10 in the OR.  As part of her FUO work up, however, she was also found to be ANA positive with a high scl-70 titer.  I speculate that maybe her bilateral leg ulcers, skin gangrene, renal failure, fatigue/weakness, and intermittent fevers may be due to autoimmune disease.  I called [facility] and left a message for a provider to call me back to discuss her case.    Assessment & Plan:   Principal Problem:   Acute metabolic  encephalopathy Active Problems:   Morbid obesity (HCC)   Essential hypertension   History of CVA with residual deficit   Anemia of chronic disease   Lymphedema   Debility   Acute pulmonary edema (HCC)   Acute renal failure superimposed on stage 5 chronic kidney disease, not on chronic dialysis (HCC)   Acute respiratory failure with hypoxia (HCC)   Right middle lobe pneumonia (HCC)   Leukocytosis   Uncontrolled diabetes mellitus with diabetic nephropathy, with long-term current use of insulin (HCC)   Dyslipidemia associated with type 2 diabetes mellitus (HCC)   ESRD (end stage renal disease) (HCC)   Goals of care, counseling/discussion   Palliative care by specialist  ESRD with acute pulmonary edema -  Patient was initially placed on aggressive IV Lasix and metolazone and progressed to ESRD - Tunneled dialysis cath Placed on 3/27. Hemodialysis initiated 3/28, then 3/29, HD 4/1, HD 4/2 - Accepted at [facility] [address]  - Vascular surgery also following, AV graft placement 4/3  Recurrent fever could be due to gangrenous left posterior leg ulcer -  She had coag negative staph from 1/2 blood cultures on 3/21 which was felt to be contaminant.  Other cultures are so far negative.   -  CT scan of chest was reviewed, from 3/22:  No evidence of abscess or source of fever -  Recent CXR and urine culture have been negative -  Repeat blood culture:  NGTD -  Appreciate wound care  assistance -  General surgery consulted > s/p OR debridement 4/10 under general anesthesia -  Resume vancomycin and zosyn, wound care per surgery team -  Surgery ordered for wound vac 4/13 -  Antibiotics per surgery team  Morbid obesity / Debility/ generalized weakness.  Unable to assist with transfers -  No home health agency will provide equipment and services to home because of lost equipment in the past -  SW consulted for SNF -  Nutrition consulted -  Body mass index is 54.58 kg/m. -  Wt has  gone down about 70-lbs since admission  Acute respiratory failure with hypoxia Resolved now.     Acute metabolic encephalopathy - less alert than baseline mental status per family - Likely multifactorial from history of CVA, end stage renal disease, possible uremia, infectious process - Had a discussion with palliative care team, they were concerned about opioid effects and recommending trial of naloxone x 1 dose which was ordered but not much improvement.  4/14: more alert and responsive and vocalizing better, CT head negative for acute findings.   Chest pain episode  -  Troponin 1 was 0.04 however d-dimer was 2.01 - VQ scan Negative for any PE  Anemia of chronic disease / IDA - Status post 2 units PRBC transfusion on 05/02/2016  - Aranesp 3/22, continue iron supplementation, B12  Uncontrolled diabetes mellitus with diabetic nephropathy with long term insulin use, hyperglycemic - continue lantus 10 units - start aspart 3 units TIC AC -  Continue low dose SSI  CBG (last 3)   Recent Labs  05/24/16 1713 05/24/16 2305 05/25/16 0800  GLUCAP 151* 161* 180*   Dyslipidemia associated with type 2 DM - Continue statin therapy   Essential hypertension, blood pressures stable, controlled, holding home BP meds  Bilateral inner thigh wounds, left leg developed foul odor and area of central gangrene.     - Related to moisture and friction from thighs rubbing together -  Appreciate general surgery assistance, debrided on 4/10 -  Antibiotics per surgery team  DVT prophylaxis:  heparin Code Status:  full Family Communication:  Patient alone, spoke with sister and husband 4/11 Disposition Plan:   SNF.     Consultants:   Nephrology  WOC  Palliative care   Nutrition  Vascular surgery   Procedures:  Hemodialysis initiated 3/28  Antimicrobials:  Anti-infectives    Start     Dose/Rate Route Frequency Ordered Stop   05/22/16 1200  vancomycin (VANCOCIN) 1,250 mg in  sodium chloride 0.9 % 250 mL IVPB  Status:  Discontinued     1,250 mg 250 mL/hr over 60 Minutes Intravenous Every M-W-F (Hemodialysis) 05/20/16 1024 05/20/16 1027   05/22/16 1200  vancomycin (VANCOCIN) IVPB 1000 mg/200 mL premix     1,000 mg 200 mL/hr over 60 Minutes Intravenous Every M-W-F (Hemodialysis) 05/20/16 1027     05/20/16 1200  vancomycin (VANCOCIN) 2,250 mg in sodium chloride 0.9 % 250 mL IVPB     2,250 mg 250 mL/hr over 60 Minutes Intravenous Every Mon (Hemodialysis) 05/20/16 1024 05/20/16 1513   05/20/16 1030  piperacillin-tazobactam (ZOSYN) IVPB 3.375 g     3.375 g 12.5 mL/hr over 240 Minutes Intravenous Every 12 hours 05/20/16 1012     05/16/16 1630  cefTRIAXone (ROCEPHIN) 1 g in dextrose 5 % 50 mL IVPB  Status:  Discontinued     1 g 100 mL/hr over 30 Minutes Intravenous Every 24 hours 05/16/16 1625 05/17/16 1641   05/14/16 1430  cefTAZidime (  FORTAZ) 1 g in dextrose 5 % 50 mL IVPB  Status:  Discontinued     1 g 100 mL/hr over 30 Minutes Intravenous Every 24 hours 05/14/16 1406 05/15/16 1325   05/14/16 1430  doxycycline (VIBRA-TABS) tablet 100 mg  Status:  Discontinued     100 mg Oral Every 12 hours 05/14/16 1406 05/15/16 1325   05/14/16 0000  ceFAZolin (ANCEF) IVPB 1 g/50 mL premix  Status:  Discontinued    Comments:  Send with pt to OR   1 g 100 mL/hr over 30 Minutes Intravenous On call 05/13/16 1630 05/15/16 1325   05/12/16 1330  cefTAZidime (FORTAZ) 1 g in dextrose 5 % 50 mL IVPB  Status:  Discontinued     1 g 100 mL/hr over 30 Minutes Intravenous Every 24 hours 05/12/16 1319 05/14/16 1406   05/08/16 1000  vancomycin (VANCOCIN) IVPB 1000 mg/200 mL premix     1,000 mg 200 mL/hr over 60 Minutes Intravenous To Surgery 05/07/16 1904 05/08/16 1004   05/08/16 0902  vancomycin (VANCOCIN) 1-5 GM/200ML-% IVPB    Comments:  Matilde Haymaker, [name]   : cabinet override      05/08/16 0902 05/08/16 0904   05/07/16 1300  ceFAZolin (ANCEF) 3 g in dextrose 5 % 50 mL IVPB     3 g 130  mL/hr over 30 Minutes Intravenous To Radiology 05/07/16 1232 05/07/16 1330   05/02/16 2200  doxycycline (VIBRA-TABS) tablet 100 mg  Status:  Discontinued     100 mg Oral Every 12 hours 05/02/16 1732 05/14/16 1406   05/01/16 2200  piperacillin-tazobactam (ZOSYN) IVPB 2.25 g  Status:  Discontinued     2.25 g 100 mL/hr over 30 Minutes Intravenous Every 8 hours 05/01/16 1324 05/02/16 1729   05/01/16 1330  vancomycin (VANCOCIN) 2,500 mg in sodium chloride 0.9 % 500 mL IVPB     2,500 mg 250 mL/hr over 120 Minutes Intravenous  Once 05/01/16 1309 05/01/16 1551   05/01/16 1300  piperacillin-tazobactam (ZOSYN) IVPB 3.375 g     3.375 g 100 mL/hr over 30 Minutes Intravenous  Once 05/01/16 1258 05/01/16 1340   05/01/16 1300  vancomycin (VANCOCIN) IVPB 1000 mg/200 mL premix  Status:  Discontinued     1,000 mg 200 mL/hr over 60 Minutes Intravenous  Once 05/01/16 1258 05/01/16 1308     Subjective: Pt more alert and responsive than I have seen her before. No complaints.   Objective: Vitals:   05/24/16 1715 05/24/16 2258 05/25/16 0534 05/25/16 0841  BP: 120/67 124/68 (!) 118/56 (!) 100/59  Pulse: (!) 101 100 96 92  Resp: Temp: 99.3 F (37.4 C) 99.4 F (37.4 C) 98.6 F (37 C) 98.2 F (36.8 C)  TempSrc: Axillary Oral Oral Oral  SpO2: 100% 98% 95% 96%  Weight:  136.1 kg (300 lb)    Height:        Intake/Output Summary (Last 24 hours) at 05/25/16 1045 Last data filed at 05/25/16 0843  Gross per 24 hour  Intake              220 ml  Output                0 ml  Net              220 ml   Filed Weights   05/24/16 2258  Weight: 136.1 kg (300 lb)    Examination:  General exam:  Adult female, obese.  Sleepy but arousable.  HEENT:  NCAT, MMM Respiratory system: Clear to auscultation bilaterally Cardiovascular system: Regular rate and rhythm, normal S1/S2. No murmurs, rubs, gallops or clicks.  Warm extremities Gastrointestinal system: Normal active bowel sounds, soft,  nondistended, nontender. MSK:  Normal tone and bulk, legs appear wrinkled as if previous swelling has improved.  Neuro:  Diffusely weak, decreased muscle tone and bulk Skin:  Wounds clean and dry.  Data Reviewed: I have personally reviewed following labs and imaging studies  CBC:  Recent Labs Lab 05/19/16 1559  05/21/16 0721 05/22/16 0403 05/23/16 0503 05/24/16 0451 05/25/16 0405  WBC 13.9*  < > 14.3* 13.7* 12.8* 14.0* 14.5*  NEUTROABS 10.1*  --   --   --   --   --   --   HGB 8.8*  < > 9.2* 8.4* 8.4* 8.2* 8.1*  HCT 27.9*  < > 29.7* 27.8* 27.4* 26.8* 26.5*  MCV 87.7  < > 88.4 88.8 88.7 88.2 88.6  PLT 356  < > 390 427* 374 376 377  < > = values in this interval not displayed. Basic Metabolic Panel:  Recent Labs Lab 05/22/16 0403 05/23/16 0503 05/24/16 0452 05/24/16 0730 05/25/16 0405  NA 136 132* 134* 134* 135  K 4.2 3.7 4.2 3.9 3.4*  CL 93* 95* 94* 94* 93*  CO2 24 22 24 23 25   GLUCOSE 200* 153* 195* 203* 178*  BUN 65* 29* 49* 50* 26*  CREATININE 5.03* 3.02* 4.38* 4.47* 3.20*  CALCIUM 9.3 8.7* 9.0 9.0 9.0  PHOS 6.3* 4.1 5.6* 5.5* 4.1   GFR: Estimated Creatinine Clearance: 33.5 mL/min (A) (by C-G formula based on SCr of 3.2 mg/dL (H)). Liver Function Tests:  Recent Labs Lab 05/22/16 0403 05/23/16 0503 05/24/16 0452 05/24/16 0730 05/25/16 0405  ALBUMIN 2.0* 1.9* 2.0* 1.8* 1.8*   No results for input(s): LIPASE, AMYLASE in the last 168 hours. No results for input(s): AMMONIA in the last 168 hours. Coagulation Profile: No results for input(s): INR, PROTIME in the last 168 hours. Cardiac Enzymes:  Recent Labs Lab 05/19/16 1559  CKTOTAL 62   BNP (last 3 results) No results for input(s): PROBNP in the last 8760 hours. HbA1C: No results for input(s): HGBA1C in the last 72 hours. CBG:  Recent Labs Lab 05/23/16 2114 05/24/16 1217 05/24/16 1713 05/24/16 2305 05/25/16 0800  GLUCAP 191* 140* 151* 161* 180*   Lipid Profile: No results for input(s):  CHOL, HDL, LDLCALC, TRIG, CHOLHDL, LDLDIRECT in the last 72 hours. Thyroid Function Tests: No results for input(s): TSH, T4TOTAL, FREET4, T3FREE, THYROIDAB in the last 72 hours. Anemia Panel: No results for input(s): VITAMINB12, FOLATE, FERRITIN, TIBC, IRON, RETICCTPCT in the last 72 hours. Urine analysis:    Component Value Date/Time   COLORURINE AMBER (A) 05/16/2016 1142   APPEARANCEUR TURBID (A) 05/16/2016 1142   LABSPEC 1.019 05/16/2016 1142   PHURINE 5.0 05/16/2016 1142   GLUCOSEU NEGATIVE 05/16/2016 1142   HGBUR MODERATE (A) 05/16/2016 1142   BILIRUBINUR NEGATIVE 05/16/2016 1142   KETONESUR NEGATIVE 05/16/2016 1142   PROTEINUR 100 (A) 05/16/2016 1142   UROBILINOGEN 0.2 10/26/2013 1721   NITRITE NEGATIVE 05/16/2016 1142   LEUKOCYTESUR MODERATE (A) 05/16/2016 1142    Recent Results (from the past 240 hour(s))  Culture, blood (Routine X 2) w Reflex to ID Panel     Status: None   Collection Time: 05/15/16  9:03 PM  Result Value Ref Range Status   Specimen Description BLOOD RIGHT HAND  Final   Special Requests   Final  BOTTLES DRAWN AEROBIC ONLY Blood Culture adequate volume   Culture NO GROWTH 6 DAYS  Final   Report Status 05/21/2016 FINAL  Final  Culture, blood (Routine X 2) w Reflex to ID Panel     Status: None   Collection Time: 05/15/16  9:10 PM  Result Value Ref Range Status   Specimen Description BLOOD RIGHT HAND  Final   Special Requests IN PEDIATRIC BOTTLE Blood Culture adequate volume  Final   Culture NO GROWTH 6 DAYS  Final   Report Status 05/21/2016 FINAL  Final  Culture, Urine     Status: None   Collection Time: 05/16/16 11:42 AM  Result Value Ref Range Status   Specimen Description URINE, RANDOM  Final   Special Requests NONE  Final   Culture NO GROWTH  Final   Report Status 05/17/2016 FINAL  Final  Culture, blood (Routine X 2) w Reflex to ID Panel     Status: None   Collection Time: 05/19/16  3:59 PM  Result Value Ref Range Status   Specimen  Description BLOOD RIGHT HAND  Final   Special Requests IN PEDIATRIC BOTTLE Blood Culture adequate volume  Final   Culture NO GROWTH 5 DAYS  Final   Report Status 05/24/2016 FINAL  Final  Culture, blood (Routine X 2) w Reflex to ID Panel     Status: None   Collection Time: 05/19/16  3:59 PM  Result Value Ref Range Status   Specimen Description BLOOD LEFT HAND  Final   Special Requests IN PEDIATRIC BOTTLE Blood Culture adequate volume  Final   Culture NO GROWTH 5 DAYS  Final   Report Status 05/24/2016 FINAL  Final  Surgical pcr screen     Status: None   Collection Time: 05/21/16  1:27 AM  Result Value Ref Range Status   MRSA, PCR NEGATIVE NEGATIVE Final   Staphylococcus aureus NEGATIVE NEGATIVE Final    Comment:        The Xpert SA Assay (FDA approved for NASAL specimens in patients over 37 years of age), is one component of a comprehensive surveillance program.  Test performance has been validated by [facility] for patients greater than or equal to 39 year old. It is not intended to diagnose infection nor to guide or monitor treatment.   Aerobic/Anaerobic Culture (surgical/deep wound)     Status: Abnormal (Preliminary result)   Collection Time: 05/21/16  9:35 AM  Result Value Ref Range Status   Specimen Description WOUND LEFT THIGH  Final   Special Requests NONE  Final   Gram Stain   Final    RARE WBC PRESENT, PREDOMINANTLY PMN FEW GRAM POSITIVE RODS    Culture MULTIPLE ORGANISMS PRESENT, NONE PREDOMINANT (A)  Final   Report Status PENDING  Incomplete   Radiology Studies: Ct Head Wo Contrast  Result Date: 05/24/2016 CLINICAL DATA:  Acute onset of encephalopathy.  Initial encounter. EXAM: CT HEAD WITHOUT CONTRAST TECHNIQUE: Contiguous axial images were obtained from the base of the skull through the vertex without intravenous contrast. COMPARISON:  CT of the head performed 09/05/2013, and MRI of the brain performed 09/03/2013 FINDINGS: Brain: No evidence of acute  infarction, hemorrhage, hydrocephalus, extra-axial collection or mass lesion/mass effect. Chronic infarcts are noted at the cerebellar hemispheres bilaterally, with mild cerebellar atrophy. A chronic lacunar infarct is noted at the left basal ganglia, extending into the left corona radiata. Mild periventricular white matter change likely reflects small vessel ischemic microangiopathy. The brainstem and fourth ventricle are within normal  limits. The cerebral hemispheres demonstrate grossly normal gray-white differentiation. No mass effect or midline shift is seen. Vascular: No hyperdense vessel or unexpected calcification. Skull: There is no evidence of fracture; visualized osseous structures are unremarkable in appearance. Sinuses/Orbits: The orbits are within normal limits. The paranasal sinuses and mastoid air cells are well-aerated. Other: No significant soft tissue abnormalities are seen. IMPRESSION: 1. No acute intracranial pathology seen on CT. 2. Chronic infarcts at the cerebellar hemispheres bilaterally, with mild cerebellar atrophy. 3. Chronic lacunar infarct at the left basal ganglia, extending into the left corona radiata. 4. Mild small vessel ischemic microangiopathy. Electronically Signed   By: Roanna Raider M.D.   On: 05/24/2016 21:43    Scheduled Meds: . albuterol  3 mL Inhalation Q M,W,F-HD  . atorvastatin  20 mg Oral Daily  . darbepoetin (ARANESP) injection - DIALYSIS  150 mcg Intravenous Q Fri-HD  . feeding supplement (NEPRO CARB STEADY)  237 mL Oral TID BM  . feeding supplement (PRO-STAT SUGAR FREE 64)  30 mL Oral BID  . gi cocktail  30 mL Oral Once  . heparin  5,000 Units Subcutaneous Q8H  . insulin aspart  0-5 Units Subcutaneous QHS  . insulin aspart  0-9 Units Subcutaneous TID WC  . insulin aspart  3 Units Subcutaneous TID WC  . insulin glargine  10 Units Subcutaneous QHS  . mouth rinse  15 mL Mouth Rinse BID  . multivitamin  1 tablet Oral QHS  . piperacillin-tazobactam  (ZOSYN)  IV  3.375 g Intravenous Q12H  . polyethylene glycol  17 g Oral BID  . senna-docusate  1 tablet Oral BID  . sevelamer carbonate  800 mg Oral TID WC  . vancomycin  1,000 mg Intravenous Q M,W,F-HD   Continuous Infusions:   LOS: 24 days   Time spent: 21 min  Standley Dakins, MD [facility] Pager [phone]  If 7PM-7AM, please contact night-coverage [email or web address] Password [email or web address] 05/25/2016, 10:45 AM

## 2016-05-25 NOTE — Progress Notes (Signed)
[facility] Progress Note  4 Days Post-Op  Subjective: CC: left thigh wound Denies pain. VAC placed yesterday  Objective: Vital signs in last 24 hours: Temp:  [97.6 F (36.4 C)-99.4 F (37.4 C)] 98.2 F (36.8 C) (04/14 0841) Pulse Rate:  [92-101] 92 (04/14 0841) Resp:  [18] 18 (04/14 0841) BP: (100-145)/(56-81) 100/59 (04/14 0841) SpO2:  [95 %-100 %] 96 % (04/14 0841) Weight:  [136.1 kg (300 lb)] 136.1 kg (300 lb) (04/13 2258) Last BM Date: 05/24/16  Intake/Output from previous day: 04/13 0701 - 04/14 0700 In: 220 [P.O.:120; IV Piggyback:100] Out: 0  Intake/Output this shift: No intake/output data recorded.  PE: General appearance: somnolent but arousable and answering question appropraitely  Skin: Skin color, texture, turgor normal. VAC placed on left thigh 4/13; good seal.  Lab Results:   Recent Labs  05/24/16 0451 05/25/16 0405  WBC 14.0* 14.5*  HGB 8.2* 8.1*  HCT 26.8* 26.5*  PLT 376 377   BMET  Recent Labs  05/24/16 0730 05/25/16 0405  NA 134* 135  K 3.9 3.4*  CL 94* 93*  CO2 23 25  GLUCOSE 203* 178*  BUN 50* 26*  CREATININE 4.47* 3.20*  CALCIUM 9.0 9.0   PT/INR No results for input(s): LABPROT, INR in the last 72 hours. CMP     Component Value Date/Time   NA 135 05/25/2016 0405   NA 137 09/16/2013 1301   K 3.4 (L) 05/25/2016 0405   K 4.7 09/16/2013 1301   CL 93 (L) 05/25/2016 0405   CL 108 (H) 09/16/2013 1301   CO2 25 05/25/2016 0405   CO2 24 09/16/2013 1301   GLUCOSE 178 (H) 05/25/2016 0405   GLUCOSE 125 (H) 09/16/2013 1301   BUN 26 (H) 05/25/2016 0405   BUN 15 09/16/2013 1301   CREATININE 3.20 (H) 05/25/2016 0405   CREATININE 1.79 (H) 09/16/2013 1301   CALCIUM 9.0 05/25/2016 0405   CALCIUM 8.3 (L) 09/16/2013 1301   PROT 7.1 05/02/2016 0555   PROT 7.0 09/16/2013 1301   ALBUMIN 1.8 (L) 05/25/2016 0405   ALBUMIN 1.3 (L) 09/16/2013 1301   AST 10 (L) 05/02/2016 0555   AST 10 (L) 09/16/2013 1301   ALT 7 (L) 05/02/2016  0555   ALT 11 (L) 09/16/2013 1301   ALKPHOS 60 05/02/2016 0555   ALKPHOS 59 09/16/2013 1301   BILITOT 1.0 05/02/2016 0555   BILITOT 0.2 09/16/2013 1301   GFRNONAA 17 (L) 05/25/2016 0405   GFRNONAA 36 (L) 09/16/2013 1301   GFRAA 20 (L) 05/25/2016 0405   GFRAA 42 (L) 09/16/2013 1301   Lipase  No results found for: LIPASE     Studies/Results: Ct Head Wo Contrast  Result Date: 05/24/2016 CLINICAL DATA:  Acute onset of encephalopathy.  Initial encounter. EXAM: CT HEAD WITHOUT CONTRAST TECHNIQUE: Contiguous axial images were obtained from the base of the skull through the vertex without intravenous contrast. COMPARISON:  CT of the head performed 09/05/2013, and MRI of the brain performed 09/03/2013 FINDINGS: Brain: No evidence of acute infarction, hemorrhage, hydrocephalus, extra-axial collection or mass lesion/mass effect. Chronic infarcts are noted at the cerebellar hemispheres bilaterally, with mild cerebellar atrophy. A chronic lacunar infarct is noted at the left basal ganglia, extending into the left corona radiata. Mild periventricular white matter change likely reflects small vessel ischemic microangiopathy. The brainstem and fourth ventricle are within normal limits. The cerebral hemispheres demonstrate grossly normal gray-white differentiation. No mass effect or midline shift is seen. Vascular: No hyperdense vessel or unexpected calcification.  Skull: There is no evidence of fracture; visualized osseous structures are unremarkable in appearance. Sinuses/Orbits: The orbits are within normal limits. The paranasal sinuses and mastoid air cells are well-aerated. Other: No significant soft tissue abnormalities are seen. IMPRESSION: 1. No acute intracranial pathology seen on CT. 2. Chronic infarcts at the cerebellar hemispheres bilaterally, with mild cerebellar atrophy. 3. Chronic lacunar infarct at the left basal ganglia, extending into the left corona radiata. 4. Mild small vessel ischemic  microangiopathy. Electronically Signed   By: Roanna Raider M.D.   On: 05/24/2016 21:43    Anti-infectives: Anti-infectives    Start     Dose/Rate Route Frequency Ordered Stop   05/22/16 1200  vancomycin (VANCOCIN) 1,250 mg in sodium chloride 0.9 % 250 mL IVPB  Status:  Discontinued     1,250 mg 250 mL/hr over 60 Minutes Intravenous Every M-W-F (Hemodialysis) 05/20/16 1024 05/20/16 1027   05/22/16 1200  vancomycin (VANCOCIN) IVPB 1000 mg/200 mL premix     1,000 mg 200 mL/hr over 60 Minutes Intravenous Every M-W-F (Hemodialysis) 05/20/16 1027     05/20/16 1200  vancomycin (VANCOCIN) 2,250 mg in sodium chloride 0.9 % 250 mL IVPB     2,250 mg 250 mL/hr over 60 Minutes Intravenous Every Mon (Hemodialysis) 05/20/16 1024 05/20/16 1513   05/20/16 1030  piperacillin-tazobactam (ZOSYN) IVPB 3.375 g     3.375 g 12.5 mL/hr over 240 Minutes Intravenous Every 12 hours 05/20/16 1012     05/16/16 1630  cefTRIAXone (ROCEPHIN) 1 g in dextrose 5 % 50 mL IVPB  Status:  Discontinued     1 g 100 mL/hr over 30 Minutes Intravenous Every 24 hours 05/16/16 1625 05/17/16 1641   05/14/16 1430  cefTAZidime (FORTAZ) 1 g in dextrose 5 % 50 mL IVPB  Status:  Discontinued     1 g 100 mL/hr over 30 Minutes Intravenous Every 24 hours 05/14/16 1406 05/15/16 1325   05/14/16 1430  doxycycline (VIBRA-TABS) tablet 100 mg  Status:  Discontinued     100 mg Oral Every 12 hours 05/14/16 1406 05/15/16 1325   05/14/16 0000  ceFAZolin (ANCEF) IVPB 1 g/50 mL premix  Status:  Discontinued    Comments:  Send with pt to OR   1 g 100 mL/hr over 30 Minutes Intravenous On call 05/13/16 1630 05/15/16 1325   05/12/16 1330  cefTAZidime (FORTAZ) 1 g in dextrose 5 % 50 mL IVPB  Status:  Discontinued     1 g 100 mL/hr over 30 Minutes Intravenous Every 24 hours 05/12/16 1319 05/14/16 1406   05/08/16 1000  vancomycin (VANCOCIN) IVPB 1000 mg/200 mL premix     1,000 mg 200 mL/hr over 60 Minutes Intravenous To Surgery 05/07/16 1904 05/08/16  1004   05/08/16 0902  vancomycin (VANCOCIN) 1-5 GM/200ML-% IVPB    Comments:  Matilde Haymaker, [name]   : cabinet override      05/08/16 0902 05/08/16 0904   05/07/16 1300  ceFAZolin (ANCEF) 3 g in dextrose 5 % 50 mL IVPB     3 g 130 mL/hr over 30 Minutes Intravenous To Radiology 05/07/16 1232 05/07/16 1330   05/02/16 2200  doxycycline (VIBRA-TABS) tablet 100 mg  Status:  Discontinued     100 mg Oral Every 12 hours 05/02/16 1732 05/14/16 1406   05/01/16 2200  piperacillin-tazobactam (ZOSYN) IVPB 2.25 g  Status:  Discontinued     2.25 g 100 mL/hr over 30 Minutes Intravenous Every 8 hours 05/01/16 1324 05/02/16 1729   05/01/16 1330  vancomycin (VANCOCIN)  2,500 mg in sodium chloride 0.9 % 500 mL IVPB     2,500 mg 250 mL/hr over 120 Minutes Intravenous  Once 05/01/16 1309 05/01/16 1551   05/01/16 1300  piperacillin-tazobactam (ZOSYN) IVPB 3.375 g     3.375 g 100 mL/hr over 30 Minutes Intravenous  Once 05/01/16 1258 05/01/16 1340   05/01/16 1300  vancomycin (VANCOCIN) IVPB 1000 mg/200 mL premix  Status:  Discontinued     1,000 mg 200 mL/hr over 60 Minutes Intravenous  Once 05/01/16 1258 05/01/16 1308       Assessment/Plan left thigh soft tissue infection s/p excision of 15x6x2.5cm area of skin and sub tissue  Acute metabolic encephalopathy/Hx of CVA with residual deficit ESRD HD MWF, in chair. 30 kg down but still edema. Pulmonary edema/Acute respiratory failure - now resolved on room air HTN  AODM with poor control - diabetic neuropathy Anemia HPTH vit d Body mass index is 49.53 kg/m. Immobility Fevers undergoing eval Anxiety ZOX:WRUEA diet ID: Zosyn/Vancomycin per pharmacy 05/20/16 =>> day 5 (Multiple organism nothing predominate) DVT: Heparin   Plan:  continue VAC, will re-examine wound Monday during VAC change    LOS: 24 days    Adam Phenix , [facility] 05/25/2016, 10:58 AM Pager: [phone] Consults: [phone] Mon-Fri 7:00 am-4:30  pm Sat-Sun 7:00 am-11:30 am

## 2016-05-25 NOTE — Progress Notes (Signed)
Assessment/Plan: 1 ESRD HD MWF . 2 HTN, off meds 3 anemia esa 4 HPTH vit d 5 Severeobesty 6 Immobility 7 Hx of CVA 8 s/p excision of L thigh wound   Subjective: Interval History: HD 4/13, kept even  Objective: Vital signs in last 24 hours: Temp:  [98.2 F (36.8 C)-99.4 F (37.4 C)] 98.2 F (36.8 C) (04/14 0841) Pulse Rate:  [92-101] 92 (04/14 0841) Resp:  [18] 18 (04/14 0841) BP: (100-124)/(56-68) 100/59 (04/14 0841) SpO2:  [95 %-100 %] 96 % (04/14 0841) Weight:  [136.1 kg (300 lb)] 136.1 kg (300 lb) (04/13 2258) Weight change:   Intake/Output from previous day: 04/13 0701 - 04/14 0700 In: 220 [P.O.:120; IV Piggyback:100] Out: 0  Intake/Output this shift: No intake/output data recorded.  General appearance: lethargic, but talking, GI: soft Extremities: wrinkles  Lab Results:  Recent Labs  05/24/16 0451 05/25/16 0405  WBC 14.0* 14.5*  HGB 8.2* 8.1*  HCT 26.8* 26.5*  PLT 376 377   BMET:  Recent Labs  05/24/16 0730 05/25/16 0405  NA 134* 135  K 3.9 3.4*  CL 94* 93*  CO2 23 25  GLUCOSE 203* 178*  BUN 50* 26*  CREATININE 4.47* 3.20*  CALCIUM 9.0 9.0   No results for input(s): PTH in the last 72 hours. Iron Studies: No results for input(s): IRON, TIBC, TRANSFERRIN, FERRITIN in the last 72 hours. Studies/Results: Ct Head Wo Contrast  Result Date: 05/24/2016 CLINICAL DATA:  Acute onset of encephalopathy.  Initial encounter. EXAM: CT HEAD WITHOUT CONTRAST TECHNIQUE: Contiguous axial images were obtained from the base of the skull through the vertex without intravenous contrast. COMPARISON:  CT of the head performed 09/05/2013, and MRI of the brain performed 09/03/2013 FINDINGS: Brain: No evidence of acute infarction, hemorrhage, hydrocephalus, extra-axial collection or mass lesion/mass effect. Chronic infarcts are noted at the cerebellar hemispheres bilaterally, with mild cerebellar atrophy. A chronic lacunar infarct is noted at the left basal ganglia,  extending into the left corona radiata. Mild periventricular white matter change likely reflects small vessel ischemic microangiopathy. The brainstem and fourth ventricle are within normal limits. The cerebral hemispheres demonstrate grossly normal gray-white differentiation. No mass effect or midline shift is seen. Vascular: No hyperdense vessel or unexpected calcification. Skull: There is no evidence of fracture; visualized osseous structures are unremarkable in appearance. Sinuses/Orbits: The orbits are within normal limits. The paranasal sinuses and mastoid air cells are well-aerated. Other: No significant soft tissue abnormalities are seen. IMPRESSION: 1. No acute intracranial pathology seen on CT. 2. Chronic infarcts at the cerebellar hemispheres bilaterally, with mild cerebellar atrophy. 3. Chronic lacunar infarct at the left basal ganglia, extending into the left corona radiata. 4. Mild small vessel ischemic microangiopathy. Electronically Signed   By: Roanna Raider M.D.   On: 05/24/2016 21:43    Scheduled: . albuterol  3 mL Inhalation Q M,W,F-HD  . atorvastatin  20 mg Oral Daily  . darbepoetin (ARANESP) injection - DIALYSIS  150 mcg Intravenous Q Fri-HD  . feeding supplement (NEPRO CARB STEADY)  237 mL Oral TID BM  . feeding supplement (PRO-STAT SUGAR FREE 64)  30 mL Oral BID  . gi cocktail  30 mL Oral Once  . heparin  5,000 Units Subcutaneous Q8H  . insulin aspart  0-5 Units Subcutaneous QHS  . insulin aspart  0-9 Units Subcutaneous TID WC  . insulin aspart  3 Units Subcutaneous TID WC  . insulin glargine  10 Units Subcutaneous QHS  . mouth rinse  15 mL Mouth  Rinse BID  . multivitamin  1 tablet Oral QHS  . piperacillin-tazobactam (ZOSYN)  IV  3.375 g Intravenous Q12H  . polyethylene glycol  17 g Oral BID  . senna-docusate  1 tablet Oral BID  . sevelamer carbonate  800 mg Oral TID WC  . [START ON 05/29/2016] vancomycin  500 mg Intravenous Q M,W,F-HD    LOS: 24 days   Britnie Colville  C 05/25/2016,2:38 PM

## 2016-05-25 NOTE — Progress Notes (Signed)
Pharmacy Antibiotic Note  [name] is a 41 y.o. female admitted on 05/01/2016 with weakness and fever. Pharmacy has been consulted for Vancomycin + Zosyn dosing for empiric coverage in the setting of fevers and known B/L LE inner thigh wounds. Pt is new ESRD on HD MWF, and now s/p OR on 4/10 for debridement of L thigh wound. Blood cultures remain no growth to date, and wound cultures with multiple organisms present.  4/14 vancomycin random level is elevated at 36 on vancomycin 1gm IV every MWF with HD. Last HD 4/13 she tolerated ~3.5 hrs@BFR  400. Holding vancomycin for one HD session would estimate VR of ~20, which is in goal, if she tolerates session.   Plan: -No vancomycin with Mon HD, then restart Wed at  IV MWF with HD -Goal VR 15-25 -Zosyn 3.375g IV every 12 hours -Continue to follow HD schedule/duration, culture results, LOT, and antibiotic de-escalation plans   Height:  (167.6 cm) Weight: 300 lb (136.1 kg) IBW/kg (Calculated) : 59.3  Temp (24hrs), Avg:98.8 F (37.1 C), Min:97.6 F (36.4 C), Max:99.4 F (37.4 C)   Recent Labs Lab 05/21/16 0721 05/22/16 0403 05/23/16 0503 05/24/16 0451 05/24/16 0452 05/24/16 0730 05/25/16 0405  WBC 14.3* 13.7* 12.8* 14.0*  --   --  14.5*  CREATININE 4.22* 5.03* 3.02*  --  4.38* 4.47* 3.20*  VANCORANDOM  --   --   --   --   --   --  36    Estimated Creatinine Clearance: 33.5 mL/min (A) (by C-G formula based on SCr of 3.2 mg/dL (H)).    Allergies  Allergen Reactions  . Azithromycin Other (See Comments)    Nose bleeding event    Antimicrobials this admission: 3/21 Vanc>>3/22; restart 4/9 >> 3/21 Zosyn>>3/22; restart 4/9 >> 3/22 Doxy>>4/4 4/1 Ceftaz >>4/4  Dose adjustments this admission: 3/23 @ 0530:  RVL 25 after  on 3/23.  Drug d/c'd. 4/14 VR: 36 on 1g QHD -> hold dose x1 HD session, est level ~20 at that time, then continue  QHD, est level ~18.  Microbiology results: 4/10 Wound Cx:  gram positive rods 4/8 BCx: NGTD 4/5 UC: negative 4/4 Bcx: ngtd 3/21BCx: CONS in 1/2 bottles 3/21 UCx: neg 3/21 HIV Ab: neg  Thank you for allowing pharmacy to be a part of this patient's care.  Carylon Perches, PharmD Acute Care Pharmacy Resident  Pager: [phone] 05/25/2016

## 2016-05-26 DIAGNOSIS — E1121 Type 2 diabetes mellitus with diabetic nephropathy: Secondary | ICD-10-CM

## 2016-05-26 LAB — CBC
HCT: 28.6 % — ABNORMAL LOW (ref 36.0–46.0)
HEMOGLOBIN: 8.6 g/dL — AB (ref 12.0–15.0)
MCH: 27 pg (ref 26.0–34.0)
MCHC: 30.1 g/dL (ref 30.0–36.0)
MCV: 89.7 fL (ref 78.0–100.0)
PLATELETS: 363 10*3/uL (ref 150–400)
RBC: 3.19 MIL/uL — AB (ref 3.87–5.11)
RDW: 16.3 % — ABNORMAL HIGH (ref 11.5–15.5)
WBC: 15.2 10*3/uL — ABNORMAL HIGH (ref 4.0–10.5)

## 2016-05-26 LAB — GLUCOSE, CAPILLARY
Glucose-Capillary: 226 mg/dL — ABNORMAL HIGH (ref 65–99)
Glucose-Capillary: 215 mg/dL — ABNORMAL HIGH (ref 65–99)
Glucose-Capillary: 263 mg/dL — ABNORMAL HIGH (ref 65–99)
Glucose-Capillary: 205 mg/dL — ABNORMAL HIGH (ref 65–99)

## 2016-05-26 LAB — RENAL FUNCTION PANEL
Albumin: 1.8 g/dL — ABNORMAL LOW (ref 3.5–5.0)
Anion gap: 17 — ABNORMAL HIGH (ref 5–15)
BUN: 46 mg/dL — ABNORMAL HIGH (ref 6–20)
CO2: 25 mmol/L (ref 22–32)
CREATININE: 4.6 mg/dL — AB (ref 0.44–1.00)
Calcium: 9.1 mg/dL (ref 8.9–10.3)
Chloride: 96 mmol/L — ABNORMAL LOW (ref 101–111)
GFR calc non Af Amer: 11 mL/min — ABNORMAL LOW (ref >60)
GFR, EST AFRICAN AMERICAN: 13 mL/min — AB (ref >60)
GLUCOSE: 211 mg/dL — AB (ref 65–99)
Phosphorus: 4.5 mg/dL (ref 2.5–4.6)
Potassium: 3.5 mmol/L (ref 3.5–5.1)
SODIUM: 138 mmol/L (ref 135–145)

## 2016-05-26 MED ORDER — INSULIN ASPART 100 UNIT/ML ~~LOC~~ SOLN
5.0000 [IU] | Freq: Three times a day (TID) | SUBCUTANEOUS | Status: DC
  Administered 2016-05-27 – 2016-05-31 (×3): 5 [IU] via SUBCUTANEOUS

## 2016-05-26 NOTE — Progress Notes (Signed)
Assessment/Plan: 1 ESRD HD MWF . 2 HTN, off meds 3 anemia esa 4 HPTH vit d 5 Severeobesty 6 Immobility 7 Hx of CVA 8 s/p excision of L thigh wound on abs  Subjective: Interval History:   Objective: Vital signs in last 24 hours: Temp:  [98.2 F (36.8 C)-99.5 F (37.5 C)] 99.5 F (37.5 C) (04/15 0845) Pulse Rate:  [96-102] 98 (04/15 0845) Resp:  [16-18] 18 (04/15 0845) BP: (49-152)/(27-89) 101/65 (04/15 0845) SpO2:  [98 %-100 %] 98 % (04/15 0845) Weight:  [135.6 kg (298 lb 15.1 oz)-135.6 kg (299 lb)] 135.6 kg (298 lb 15.1 oz) (04/15 0406) Weight change: -0.454 kg (-1 lb)  Intake/Output from previous day: 04/14 0701 - 04/15 0700 In: 250 [P.O.:200; IV Piggyback:50] Out: 0  Intake/Output this shift: Total I/O In: 50 [IV Piggyback:50] Out: -   General appearance: lethargic, weak, flat Chest wall: no tenderness Extremities: N/E  Cor RRR abd obese, soft   Lab Results:  Recent Labs  05/25/16 0405 05/26/16 0517  WBC 14.5* 15.2*  HGB 8.1* 8.6*  HCT 26.5* 28.6*  PLT 377 363   BMET:  Recent Labs  05/25/16 0405 05/26/16 0517  NA 135 138  K 3.4* 3.5  CL 93* 96*  CO2 25 25  GLUCOSE 178* 211*  BUN 26* 46*  CREATININE 3.20* 4.60*  CALCIUM 9.0 9.1   No results for input(s): PTH in the last 72 hours. Iron Studies: No results for input(s): IRON, TIBC, TRANSFERRIN, FERRITIN in the last 72 hours. Studies/Results: Ct Head Wo Contrast  Result Date: 05/24/2016 CLINICAL DATA:  Acute onset of encephalopathy.  Initial encounter. EXAM: CT HEAD WITHOUT CONTRAST TECHNIQUE: Contiguous axial images were obtained from the base of the skull through the vertex without intravenous contrast. COMPARISON:  CT of the head performed 09/05/2013, and MRI of the brain performed 09/03/2013 FINDINGS: Brain: No evidence of acute infarction, hemorrhage, hydrocephalus, extra-axial collection or mass lesion/mass effect. Chronic infarcts are noted at the cerebellar hemispheres bilaterally, with  mild cerebellar atrophy. A chronic lacunar infarct is noted at the left basal ganglia, extending into the left corona radiata. Mild periventricular white matter change likely reflects small vessel ischemic microangiopathy. The brainstem and fourth ventricle are within normal limits. The cerebral hemispheres demonstrate grossly normal gray-white differentiation. No mass effect or midline shift is seen. Vascular: No hyperdense vessel or unexpected calcification. Skull: There is no evidence of fracture; visualized osseous structures are unremarkable in appearance. Sinuses/Orbits: The orbits are within normal limits. The paranasal sinuses and mastoid air cells are well-aerated. Other: No significant soft tissue abnormalities are seen. IMPRESSION: 1. No acute intracranial pathology seen on CT. 2. Chronic infarcts at the cerebellar hemispheres bilaterally, with mild cerebellar atrophy. 3. Chronic lacunar infarct at the left basal ganglia, extending into the left corona radiata. 4. Mild small vessel ischemic microangiopathy. Electronically Signed   By: Roanna Raider M.D.   On: 05/24/2016 21:43    Scheduled: . albuterol  3 mL Inhalation Q M,W,F-HD  . atorvastatin  20 mg Oral Daily  . darbepoetin (ARANESP) injection - DIALYSIS  150 mcg Intravenous Q Fri-HD  . feeding supplement (NEPRO CARB STEADY)  237 mL Oral TID BM  . feeding supplement (PRO-STAT SUGAR FREE 64)  30 mL Oral BID  . gi cocktail  30 mL Oral Once  . heparin  5,000 Units Subcutaneous Q8H  . insulin aspart  0-5 Units Subcutaneous QHS  . insulin aspart  0-9 Units Subcutaneous TID WC  . insulin aspart  5 Units Subcutaneous TID WC  . insulin glargine  10 Units Subcutaneous QHS  . mouth rinse  15 mL Mouth Rinse BID  . multivitamin  1 tablet Oral QHS  . piperacillin-tazobactam (ZOSYN)  IV  3.375 g Intravenous Q12H  . polyethylene glycol  17 g Oral BID  . senna-docusate  1 tablet Oral BID  . sevelamer carbonate  800 mg Oral TID WC  . [START ON  05/29/2016] vancomycin  500 mg Intravenous Q M,W,F-HD      LOS: 25 days   Lichelle Viets C 05/26/2016,3:52 PM

## 2016-05-26 NOTE — Progress Notes (Signed)
PROGRESS NOTE  [name]  ZOX:[id number] DOB: 10-02-1975 DOA: 05/01/2016 PCP: Marletta Lor, NP  Brief Narrative:  41 y.o.femalewith medical history significant formorbid obesity, chronic kidney disease stage IV-V, diabetes mellitus, hypertension, prior CVA, diastolic CHF who presented to ED with progressive weakness, fever for past 1 week prior to this admission. She is actually bed bound for past year or so per her husband report ever since stroke. She also had dry cough and shortness of breath for past 1 week.  Her creatinine was 7.9 (much worse since her recent Cr values of 4-5).  She was started on hemodialysis and underwent left upper arm AV graft placement on 4/3.  She was anticipating going home with her family but because she is too weak to assist with transfers and is morbidly obese, she would need a hoyer lift, hospital bed, and wheelchair for transport to dialysis.  Unfortunately, she was given a hospital bed last year which was "lost" and no home health agencies in the region will provide her with equipment.  She was told that she will need to go to SNF for short term rehabilitation to regain enough strength to at least transfer from a low bed/mattress to a chair in order to get to HD.  She has been having intermittent fevers without defined source.  Her fevers appeared to get better with cessation of antibiotics, however, they returned.  This may be due to a gangrenous area on the posterior left thigh which was debrided by general surgery on 4/10 in the OR.   Assessment & Plan:   Principal Problem:   Acute metabolic encephalopathy Active Problems:   Morbid obesity (HCC)   Essential hypertension   History of CVA with residual deficit   Anemia of chronic disease   Lymphedema   Debility   Acute pulmonary edema (HCC)   Acute renal failure superimposed on stage 5 chronic kidney disease, not on chronic dialysis (HCC)   Acute respiratory failure with hypoxia (HCC)   Right  middle lobe pneumonia (HCC)   Leukocytosis   Uncontrolled diabetes mellitus with diabetic nephropathy, with long-term current use of insulin (HCC)   Dyslipidemia associated with type 2 diabetes mellitus (HCC)   ESRD (end stage renal disease) (HCC)   Goals of care, counseling/discussion   Palliative care by specialist  ESRD with acute pulmonary edema -  Patient was initially placed on aggressive IV Lasix and metolazone and progressed to ESRD - Tunneled dialysis cath Placed on 3/27. Hemodialysis initiated 3/28, then 3/29, HD 4/1, HD 4/2 - Accepted at [facility] [address]  - Vascular surgery also following, AV graft placement 4/3  Gangrenous left posterior leg ulcer -  She had coag negative staph from 1/2 blood cultures on 3/21 which was felt to be contaminant.  Other cultures are so far negative.   -  CT scan of chest was reviewed, from 3/22:  No evidence of abscess or source of fever -  Recent CXR and urine culture have been negative -  Repeat blood culture:  NGTD -  Appreciate wound care assistance -  General surgery consulted > s/p OR debridement 4/10 under general anesthesia -  Resume vancomycin and zosyn, wound care per surgery team -  Surgery ordered for wound vac 4/13 -  Antibiotics per surgery team  Morbid obesity / Debility/ generalized weakness.  Unable to assist with transfers -  No home health agency will provide equipment and services to home because of lost equipment in the past -  SW consulted for SNF placement -  Nutrition consulted - appreciate recommendations -  Body mass index is 54.58 kg/m. -  Wt has gone down about 70-lbs since admission  Acute respiratory failure with hypoxia Resolved now.     Acute metabolic encephalopathy - less alert than baseline mental status per family - Likely multifactorial from history of CVA, end stage renal disease, possible uremia, infectious process 4/14: more alert and responsive and vocalizing better, CT head  negative for acute findings.   Chest pain episode  -  Troponin 1 was 0.04 however d-dimer was 2.01 - VQ scan Negative for any PE  Anemia of chronic disease / IDA - Status post 2 units PRBC transfusion on 05/02/2016  - Aranesp 3/22, continue iron supplementation, B12  Uncontrolled diabetes mellitus with diabetic nephropathy with long term insulin use, hyperglycemic - continue lantus 10 units - start aspart 3 units TIC AC -  Continue low dose SSI  CBG (last 3)   Recent Labs  05/25/16 2033 05/26/16 0800 05/26/16 1232  GLUCAP 176* 226* 215*   Dyslipidemia associated with type 2 DM - Continue statin therapy   Essential hypertension, blood pressures stable, controlled, holding home BP meds  Bilateral inner thigh wounds, left leg developed foul odor and area of central gangrene.     -  Related to moisture and friction from thighs rubbing together -  Appreciate general surgery assistance, debrided on 4/10 -  Antibiotics per surgery team  DVT prophylaxis:  heparin Code Status:  full Family Communication:  Patient alone, spoke with sister and husband 4/11 Disposition Plan:   SNF.     Consultants:   Nephrology  WOC  Palliative care   Nutrition  Vascular surgery   Procedures:  Hemodialysis initiated 3/28  Antimicrobials:  Anti-infectives    Start     Dose/Rate Route Frequency Ordered Stop   05/29/16 1200  vancomycin (VANCOCIN) 500 mg in sodium chloride 0.9 % 100 mL IVPB     500 mg 100 mL/hr over 60 Minutes Intravenous Every M-W-F (Hemodialysis) 05/25/16 1126     05/22/16 1200  vancomycin (VANCOCIN) 1,250 mg in sodium chloride 0.9 % 250 mL IVPB  Status:  Discontinued     1,250 mg 250 mL/hr over 60 Minutes Intravenous Every M-W-F (Hemodialysis) 05/20/16 1024 05/20/16 1027   05/22/16 1200  vancomycin (VANCOCIN) IVPB 1000 mg/200 mL premix  Status:  Discontinued     1,000 mg 200 mL/hr over 60 Minutes Intravenous Every M-W-F (Hemodialysis) 05/20/16 1027 05/25/16  1126   05/20/16 1200  vancomycin (VANCOCIN) 2,250 mg in sodium chloride 0.9 % 250 mL IVPB     2,250 mg 250 mL/hr over 60 Minutes Intravenous Every Mon (Hemodialysis) 05/20/16 1024 05/20/16 1513   05/20/16 1030  piperacillin-tazobactam (ZOSYN) IVPB 3.375 g     3.375 g 12.5 mL/hr over 240 Minutes Intravenous Every 12 hours 05/20/16 1012     05/16/16 1630  cefTRIAXone (ROCEPHIN) 1 g in dextrose 5 % 50 mL IVPB  Status:  Discontinued     1 g 100 mL/hr over 30 Minutes Intravenous Every 24 hours 05/16/16 1625 05/17/16 1641   05/14/16 1430  cefTAZidime (FORTAZ) 1 g in dextrose 5 % 50 mL IVPB  Status:  Discontinued     1 g 100 mL/hr over 30 Minutes Intravenous Every 24 hours 05/14/16 1406 05/15/16 1325   05/14/16 1430  doxycycline (VIBRA-TABS) tablet 100 mg  Status:  Discontinued     100 mg Oral Every 12 hours 05/14/16 1406  05/15/16 1325   05/14/16 0000  ceFAZolin (ANCEF) IVPB 1 g/50 mL premix  Status:  Discontinued    Comments:  Send with pt to OR   1 g 100 mL/hr over 30 Minutes Intravenous On call 05/13/16 1630 05/15/16 1325   05/12/16 1330  cefTAZidime (FORTAZ) 1 g in dextrose 5 % 50 mL IVPB  Status:  Discontinued     1 g 100 mL/hr over 30 Minutes Intravenous Every 24 hours 05/12/16 1319 05/14/16 1406   05/08/16 1000  vancomycin (VANCOCIN) IVPB 1000 mg/200 mL premix     1,000 mg 200 mL/hr over 60 Minutes Intravenous To Surgery 05/07/16 1904 05/08/16 1004   05/08/16 0902  vancomycin (VANCOCIN) 1-5 GM/200ML-% IVPB    Comments:  Matilde Haymaker, [name]   : cabinet override      05/08/16 0902 05/08/16 0904   05/07/16 1300  ceFAZolin (ANCEF) 3 g in dextrose 5 % 50 mL IVPB     3 g 130 mL/hr over 30 Minutes Intravenous To Radiology 05/07/16 1232 05/07/16 1330   05/02/16 2200  doxycycline (VIBRA-TABS) tablet 100 mg  Status:  Discontinued     100 mg Oral Every 12 hours 05/02/16 1732 05/14/16 1406   05/01/16 2200  piperacillin-tazobactam (ZOSYN) IVPB 2.25 g  Status:  Discontinued     2.25 g 100 mL/hr  over 30 Minutes Intravenous Every 8 hours 05/01/16 1324 05/02/16 1729   05/01/16 1330  vancomycin (VANCOCIN) 2,500 mg in sodium chloride 0.9 % 500 mL IVPB     2,500 mg 250 mL/hr over 120 Minutes Intravenous  Once 05/01/16 1309 05/01/16 1551   05/01/16 1300  piperacillin-tazobactam (ZOSYN) IVPB 3.375 g     3.375 g 100 mL/hr over 30 Minutes Intravenous  Once 05/01/16 1258 05/01/16 1340   05/01/16 1300  vancomycin (VANCOCIN) IVPB 1000 mg/200 mL premix  Status:  Discontinued     1,000 mg 200 mL/hr over 60 Minutes Intravenous  Once 05/01/16 1258 05/01/16 1308     Subjective: Pt sleepy but arousable, denies complaints  Objective: Vitals:   05/26/16 0240 05/26/16 0406 05/26/16 0437 05/26/16 0845  BP: 133/70  125/77 101/65  Pulse: 100  (!) 102 98  Resp: Temp: 99.2 F (37.3 C)  99.3 F (37.4 C) 99.5 F (37.5 C)  TempSrc: Oral  Oral Oral  SpO2: 98%  98% 98%  Weight:  135.6 kg (298 lb 15.1 oz)    Height:        Intake/Output Summary (Last 24 hours) at 05/26/16 1331 Last data filed at 05/26/16 0800  Gross per 24 hour  Intake              300 ml  Output                0 ml  Net              300 ml   Filed Weights   05/24/16 2258 05/25/16 2039 05/26/16 0406  Weight: 136.1 kg (300 lb) 135.6 kg (299 lb) 135.6 kg (298 lb 15.1 oz)    Examination:  General exam:  Adult female, obese.  Sleepy but arousable.   HEENT:  NCAT, MM dry Respiratory system: Clear to auscultation bilaterally Cardiovascular system:  normal S1/S2. No murmurs, rubs, gallops or clicks.  Warm extremities Gastrointestinal system: Normal active bowel sounds, soft, nondistended, nontender. MSK:  Leg wound was covered in loose brown stool.  Normal tone and bulk, legs appear wrinkled as if previous swelling  has improved.  Neuro:  Diffusely weak, decreased muscle tone and bulk  Data Reviewed: I have personally reviewed following labs and imaging studies  CBC:  Recent Labs Lab 05/19/16 1559   05/22/16 0403 05/23/16 0503 05/24/16 0451 05/25/16 0405 05/26/16 0517  WBC 13.9*  < > 13.7* 12.8* 14.0* 14.5* 15.2*  NEUTROABS 10.1*  --   --   --   --   --   --   HGB 8.8*  < > 8.4* 8.4* 8.2* 8.1* 8.6*  HCT 27.9*  < > 27.8* 27.4* 26.8* 26.5* 28.6*  MCV 87.7  < > 88.8 88.7 88.2 88.6 89.7  PLT 356  < > 427* 374 376 377 363  < > = values in this interval not displayed. Basic Metabolic Panel:  Recent Labs Lab 05/23/16 0503 05/24/16 0452 05/24/16 0730 05/25/16 0405 05/26/16 0517  NA 132* 134* 134* 135 138  K 3.7 4.2 3.9 3.4* 3.5  CL 95* 94* 94* 93* 96*  CO2 22 24 23 25 25   GLUCOSE 153* 195* 203* 178* 211*  BUN 29* 49* 50* 26* 46*  CREATININE 3.02* 4.38* 4.47* 3.20* 4.60*  CALCIUM 8.7* 9.0 9.0 9.0 9.1  PHOS 4.1 5.6* 5.5* 4.1 4.5   GFR: Estimated Creatinine Clearance: 23.3 mL/min (A) (by C-G formula based on SCr of 4.6 mg/dL (H)). Liver Function Tests:  Recent Labs Lab 05/23/16 0503 05/24/16 0452 05/24/16 0730 05/25/16 0405 05/26/16 0517  ALBUMIN 1.9* 2.0* 1.8* 1.8* 1.8*   No results for input(s): LIPASE, AMYLASE in the last 168 hours. No results for input(s): AMMONIA in the last 168 hours. Coagulation Profile: No results for input(s): INR, PROTIME in the last 168 hours. Cardiac Enzymes:  Recent Labs Lab 05/19/16 1559  CKTOTAL 62   BNP (last 3 results) No results for input(s): PROBNP in the last 8760 hours. HbA1C: No results for input(s): HGBA1C in the last 72 hours. CBG:  Recent Labs Lab 05/25/16 1155 05/25/16 1636 05/25/16 2033 05/26/16 0800 05/26/16 1232  GLUCAP 138* 215* 176* 226* 215*   Lipid Profile: No results for input(s): CHOL, HDL, LDLCALC, TRIG, CHOLHDL, LDLDIRECT in the last 72 hours. Thyroid Function Tests: No results for input(s): TSH, T4TOTAL, FREET4, T3FREE, THYROIDAB in the last 72 hours. Anemia Panel: No results for input(s): VITAMINB12, FOLATE, FERRITIN, TIBC, IRON, RETICCTPCT in the last 72 hours. Urine analysis:     Component Value Date/Time   COLORURINE AMBER (A) 05/16/2016 1142   APPEARANCEUR TURBID (A) 05/16/2016 1142   LABSPEC 1.019 05/16/2016 1142   PHURINE 5.0 05/16/2016 1142   GLUCOSEU NEGATIVE 05/16/2016 1142   HGBUR MODERATE (A) 05/16/2016 1142   BILIRUBINUR NEGATIVE 05/16/2016 1142   KETONESUR NEGATIVE 05/16/2016 1142   PROTEINUR 100 (A) 05/16/2016 1142   UROBILINOGEN 0.2 10/26/2013 1721   NITRITE NEGATIVE 05/16/2016 1142   LEUKOCYTESUR MODERATE (A) 05/16/2016 1142    Recent Results (from the past 240 hour(s))  Culture, blood (Routine X 2) w Reflex to ID Panel     Status: None   Collection Time: 05/19/16  3:59 PM  Result Value Ref Range Status   Specimen Description BLOOD RIGHT HAND  Final   Special Requests IN PEDIATRIC BOTTLE Blood Culture adequate volume  Final   Culture NO GROWTH 5 DAYS  Final   Report Status 05/24/2016 FINAL  Final  Culture, blood (Routine X 2) w Reflex to ID Panel     Status: None   Collection Time: 05/19/16  3:59 PM  Result Value Ref Range Status  Specimen Description BLOOD LEFT HAND  Final   Special Requests IN PEDIATRIC BOTTLE Blood Culture adequate volume  Final   Culture NO GROWTH 5 DAYS  Final   Report Status 05/24/2016 FINAL  Final  Surgical pcr screen     Status: None   Collection Time: 05/21/16  1:27 AM  Result Value Ref Range Status   MRSA, PCR NEGATIVE NEGATIVE Final   Staphylococcus aureus NEGATIVE NEGATIVE Final    Comment:        The Xpert SA Assay (FDA approved for NASAL specimens in patients over 7 years of age), is one component of a comprehensive surveillance program.  Test performance has been validated by [facility] for patients greater than or equal to 67 year old. It is not intended to diagnose infection nor to guide or monitor treatment.   Aerobic/Anaerobic Culture (surgical/deep wound)     Status: Abnormal (Preliminary result)   Collection Time: 05/21/16  9:35 AM  Result Value Ref Range Status   Specimen Description  WOUND LEFT THIGH  Final   Special Requests NONE  Final   Gram Stain   Final    RARE WBC PRESENT, PREDOMINANTLY PMN FEW GRAM POSITIVE RODS    Culture MULTIPLE ORGANISMS PRESENT, NONE PREDOMINANT (A)  Final   Report Status PENDING  Incomplete   Radiology Studies: Ct Head Wo Contrast  Result Date: 05/24/2016 CLINICAL DATA:  Acute onset of encephalopathy.  Initial encounter. EXAM: CT HEAD WITHOUT CONTRAST TECHNIQUE: Contiguous axial images were obtained from the base of the skull through the vertex without intravenous contrast. COMPARISON:  CT of the head performed 09/05/2013, and MRI of the brain performed 09/03/2013 FINDINGS: Brain: No evidence of acute infarction, hemorrhage, hydrocephalus, extra-axial collection or mass lesion/mass effect. Chronic infarcts are noted at the cerebellar hemispheres bilaterally, with mild cerebellar atrophy. A chronic lacunar infarct is noted at the left basal ganglia, extending into the left corona radiata. Mild periventricular white matter change likely reflects small vessel ischemic microangiopathy. The brainstem and fourth ventricle are within normal limits. The cerebral hemispheres demonstrate grossly normal gray-white differentiation. No mass effect or midline shift is seen. Vascular: No hyperdense vessel or unexpected calcification. Skull: There is no evidence of fracture; visualized osseous structures are unremarkable in appearance. Sinuses/Orbits: The orbits are within normal limits. The paranasal sinuses and mastoid air cells are well-aerated. Other: No significant soft tissue abnormalities are seen. IMPRESSION: 1. No acute intracranial pathology seen on CT. 2. Chronic infarcts at the cerebellar hemispheres bilaterally, with mild cerebellar atrophy. 3. Chronic lacunar infarct at the left basal ganglia, extending into the left corona radiata. 4. Mild small vessel ischemic microangiopathy. Electronically Signed   By: Roanna Raider M.D.   On: 05/24/2016 21:43     Scheduled Meds: . albuterol  3 mL Inhalation Q M,W,F-HD  . atorvastatin  20 mg Oral Daily  . darbepoetin (ARANESP) injection - DIALYSIS  150 mcg Intravenous Q Fri-HD  . feeding supplement (NEPRO CARB STEADY)  237 mL Oral TID BM  . feeding supplement (PRO-STAT SUGAR FREE 64)  30 mL Oral BID  . gi cocktail  30 mL Oral Once  . heparin  5,000 Units Subcutaneous Q8H  . insulin aspart  0-5 Units Subcutaneous QHS  . insulin aspart  0-9 Units Subcutaneous TID WC  . insulin aspart  3 Units Subcutaneous TID WC  . insulin glargine  10 Units Subcutaneous QHS  . mouth rinse  15 mL Mouth Rinse BID  . multivitamin  1 tablet Oral  QHS  . piperacillin-tazobactam (ZOSYN)  IV  3.375 g Intravenous Q12H  . polyethylene glycol  17 g Oral BID  . senna-docusate  1 tablet Oral BID  . sevelamer carbonate  800 mg Oral TID WC  . [START ON 05/29/2016] vancomycin  500 mg Intravenous Q M,W,F-HD   Continuous Infusions:   LOS: 25 days   Time spent: 18 min  Standley Dakins, MD [facility] Pager [phone]  If 7PM-7AM, please contact night-coverage [email or web address] Password [email or web address] 05/26/2016, 1:31 PM

## 2016-05-27 DIAGNOSIS — L8990 Pressure ulcer of unspecified site, unspecified stage: Secondary | ICD-10-CM | POA: Insufficient documentation

## 2016-05-27 LAB — CBC
HCT: 28.5 % — ABNORMAL LOW (ref 36.0–46.0)
HEMATOCRIT: 29.1 % — AB (ref 36.0–46.0)
HEMOGLOBIN: 8.7 g/dL — AB (ref 12.0–15.0)
Hemoglobin: 8.8 g/dL — ABNORMAL LOW (ref 12.0–15.0)
MCH: 27.1 pg (ref 26.0–34.0)
MCH: 27 pg (ref 26.0–34.0)
MCHC: 30.5 g/dL (ref 30.0–36.0)
MCHC: 30.2 g/dL (ref 30.0–36.0)
MCV: 88.8 fL (ref 78.0–100.0)
MCV: 89.3 fL (ref 78.0–100.0)
Platelets: 347 10*3/uL (ref 150–400)
Platelets: 322 10*3/uL (ref 150–400)
RBC: 3.21 MIL/uL — AB (ref 3.87–5.11)
RBC: 3.26 MIL/uL — AB (ref 3.87–5.11)
RDW: 16.4 % — ABNORMAL HIGH (ref 11.5–15.5)
RDW: 17.1 % — ABNORMAL HIGH (ref 11.5–15.5)
WBC: 15.9 10*3/uL — ABNORMAL HIGH (ref 4.0–10.5)
WBC: 15.7 10*3/uL — AB (ref 4.0–10.5)

## 2016-05-27 LAB — RENAL FUNCTION PANEL
ANION GAP: 21 — AB (ref 5–15)
Albumin: 1.9 g/dL — ABNORMAL LOW (ref 3.5–5.0)
BUN: 61 mg/dL — AB (ref 6–20)
CO2: 23 mmol/L (ref 22–32)
Calcium: 9.5 mg/dL (ref 8.9–10.3)
Chloride: 95 mmol/L — ABNORMAL LOW (ref 101–111)
Creatinine, Ser: 5.54 mg/dL — ABNORMAL HIGH (ref 0.44–1.00)
GFR calc Af Amer: 10 mL/min — ABNORMAL LOW (ref >60)
GFR calc non Af Amer: 9 mL/min — ABNORMAL LOW (ref >60)
GLUCOSE: 182 mg/dL — AB (ref 65–99)
POTASSIUM: 3.9 mmol/L (ref 3.5–5.1)
Phosphorus: 4.9 mg/dL — ABNORMAL HIGH (ref 2.5–4.6)
Sodium: 139 mmol/L (ref 135–145)

## 2016-05-27 LAB — GLUCOSE, CAPILLARY
Glucose-Capillary: 142 mg/dL — ABNORMAL HIGH (ref 65–99)
Glucose-Capillary: 150 mg/dL — ABNORMAL HIGH (ref 65–99)
Glucose-Capillary: 154 mg/dL — ABNORMAL HIGH (ref 65–99)

## 2016-05-27 MED ORDER — LIDOCAINE HCL (PF) 1 % IJ SOLN
5.0000 mL | INTRAMUSCULAR | Status: DC | PRN
  Filled 2016-05-27: qty 5

## 2016-05-27 MED ORDER — PENTAFLUOROPROP-TETRAFLUOROETH EX AERO: 1.0000 "application " | INHALATION_SPRAY | CUTANEOUS | Status: DC | PRN

## 2016-05-27 MED ORDER — FLUCONAZOLE 100 MG PO TABS
100.0000 mg | ORAL_TABLET | Freq: Once | ORAL | Status: AC
  Administered 2016-05-27: 100 mg via ORAL
  Filled 2016-05-27: qty 1

## 2016-05-27 MED ORDER — HEPARIN SODIUM (PORCINE) 1000 UNIT/ML DIALYSIS: 1000.0000 [IU] | INTRAMUSCULAR | Status: DC | PRN

## 2016-05-27 MED ORDER — HEPARIN SODIUM (PORCINE) 1000 UNIT/ML DIALYSIS: 20.0000 [IU]/kg | INTRAMUSCULAR | Status: DC | PRN

## 2016-05-27 MED ORDER — ALTEPLASE 2 MG IJ SOLR: 2.0000 mg | Freq: Once | INTRAMUSCULAR | Status: DC | PRN

## 2016-05-27 MED ORDER — SODIUM CHLORIDE 0.9 % IV SOLN
3.0000 g | INTRAVENOUS | Status: DC
  Administered 2016-05-27 – 2016-05-30 (×4): 3 g via INTRAVENOUS
  Filled 2016-05-27 (×5): qty 3

## 2016-05-27 MED ORDER — SODIUM CHLORIDE 0.9 % IV SOLN: 100.0000 mL | INTRAVENOUS | Status: DC | PRN

## 2016-05-27 MED ORDER — LIDOCAINE-PRILOCAINE 2.5-2.5 % EX CREA: 1.0000 "application " | TOPICAL_CREAM | CUTANEOUS | Status: DC | PRN

## 2016-05-27 MED ORDER — VANCOMYCIN HCL IN DEXTROSE 500-5 MG/100ML-% IV SOLN
INTRAVENOUS | Status: AC
  Filled 2016-05-27: qty 100

## 2016-05-27 MED ORDER — INSULIN GLARGINE 100 UNIT/ML ~~LOC~~ SOLN
14.0000 [IU] | Freq: Every day | SUBCUTANEOUS | Status: DC
  Administered 2016-05-27 – 2016-05-30 (×4): 14 [IU] via SUBCUTANEOUS
  Filled 2016-05-27 (×5): qty 0.14

## 2016-05-27 MED ORDER — NYSTATIN 100000 UNIT/ML MT SUSP
5.0000 mL | Freq: Four times a day (QID) | OROMUCOSAL | Status: DC
  Administered 2016-05-27 – 2016-05-31 (×14): 500000 [IU] via ORAL
  Filled 2016-05-27 (×15): qty 5

## 2016-05-27 NOTE — Consult Note (Addendum)
WOC Nurse wound follow up Wound type: surgical s/p debridement Measurement: 17cm x 4.5cm x 3.0cm  Wound bed: dry, dark in the distal edge, non granular Drainage (amount, consistency, odor) minimal, serosanguinous  Periwound: brawny dry edeama Dressing procedure/placement/frequency: 1pc of black foam used to fill the wound bed, hydrocolloid used to windowframe the wound.  Difficult to obtain a seal with skin changes in the periwound area.  Skin prep applied to the periwound and then over the drape to seal any microopenings.  WOC noted that patient continues to be very lethargic and her feet rotate out laterally, concerning for her heels.  Discovered Deep tissue injury on the left lateral heel/  Right heel with out injury.  Will add foam dressing and Prevalon boot for offloading heels.   WOC will continue to change dressing M/W/F due to difficulty with seal of this dressing.  Ellias Mcelreath [facility], CNS [phone]

## 2016-05-27 NOTE — Progress Notes (Signed)
Lab asked if patient needed another renal function panel, Since she came back from dialysis called HD nurse to check, Melissa HD nurse verified that patient doesn't need another renal panel as the nurse already had results.

## 2016-05-27 NOTE — Progress Notes (Signed)
[facility] Progress Note  6 Days Post-Op  Subjective: CC: thigh wound s/p debridement  Just returned from dialysis. No complaints.  Objective: Vital signs in last 24 hours: Temp:  [97.8 F (36.6 C)-98.6 F (37 C)] 98.2 F (36.8 C) (04/16 1145) Pulse Rate:  [90-105] 96 (04/16 1145) Resp:  [18-23] 23 (04/16 1145) BP: (98-140)/(50-86) 137/80 (04/16 1145) SpO2:  [95 %-98 %] 98 % (04/16 1145) Weight:  [135.6 kg (298 lb 15.1 oz)-135.7 kg (299 lb 2.6 oz)] 135.6 kg (298 lb 15.1 oz) (04/16 1145) Last BM Date: 05/26/16  Intake/Output from previous day: 04/15 0701 - 04/16 0700 In: 330 [P.O.:60; I.V.:120; IV Piggyback:150] Out: 0  Intake/Output this shift: No intake/output data recorded.  PE: Gen:  Somnolent/encephalopathic, NAD Left thigh: VAC Removed:      Lab Results:   Recent Labs  05/27/16 0453 05/27/16 0659  WBC 15.9* 15.7*  HGB 8.7* 8.8*  HCT 28.5* 29.1*  PLT 347 322   BMET  Recent Labs  05/26/16 0517 05/27/16 0453  NA 138 139  K 3.5 3.9  CL 96* 95*  CO2 25 23  GLUCOSE 211* 182*  BUN 46* 61*  CREATININE 4.60* 5.54*  CALCIUM 9.1 9.5   PT/INR No results for input(s): LABPROT, INR in the last 72 hours. CMP     Component Value Date/Time   NA 139 05/27/2016 0453   NA 137 09/16/2013 1301   K 3.9 05/27/2016 0453   K 4.7 09/16/2013 1301   CL 95 (L) 05/27/2016 0453   CL 108 (H) 09/16/2013 1301   CO2 23 05/27/2016 0453   CO2 24 09/16/2013 1301   GLUCOSE 182 (H) 05/27/2016 0453   GLUCOSE 125 (H) 09/16/2013 1301   BUN 61 (H) 05/27/2016 0453   BUN 15 09/16/2013 1301   CREATININE 5.54 (H) 05/27/2016 0453   CREATININE 1.79 (H) 09/16/2013 1301   CALCIUM 9.5 05/27/2016 0453   CALCIUM 8.3 (L) 09/16/2013 1301   PROT 7.1 05/02/2016 0555   PROT 7.0 09/16/2013 1301   ALBUMIN 1.9 (L) 05/27/2016 0453   ALBUMIN 1.3 (L) 09/16/2013 1301   AST 10 (L) 05/02/2016 0555   AST 10 (L) 09/16/2013 1301   ALT 7 (L) 05/02/2016 0555   ALT 11 (L) 09/16/2013  1301   ALKPHOS 60 05/02/2016 0555   ALKPHOS 59 09/16/2013 1301   BILITOT 1.0 05/02/2016 0555   BILITOT 0.2 09/16/2013 1301   GFRNONAA 9 (L) 05/27/2016 0453   GFRNONAA 36 (L) 09/16/2013 1301   GFRAA 10 (L) 05/27/2016 0453   GFRAA 42 (L) 09/16/2013 1301   Lipase  No results found for: LIPASE     Studies/Results: No results found.  Anti-infectives: Anti-infectives    Start     Dose/Rate Route Frequency Ordered Stop   05/29/16 1200  vancomycin (VANCOCIN) 500 mg in sodium chloride 0.9 % 100 mL IVPB  Status:  Discontinued     500 mg 100 mL/hr over 60 Minutes Intravenous Every M-W-F (Hemodialysis) 05/25/16 1126 05/27/16 0855   05/27/16 1122  vancomycin (VANCOCIN) 500-5 MG/100ML-% IVPB  Status:  Discontinued    Comments:  Herriott, [name]   : cabinet override      05/27/16 1122 05/27/16 1126   05/22/16 1200  vancomycin (VANCOCIN) 1,250 mg in sodium chloride 0.9 % 250 mL IVPB  Status:  Discontinued     1,250 mg 250 mL/hr over 60 Minutes Intravenous Every M-W-F (Hemodialysis) 05/20/16 1024 05/20/16 1027   05/22/16 1200  vancomycin (VANCOCIN) IVPB 1000 mg/200  mL premix  Status:  Discontinued     1,000 mg 200 mL/hr over 60 Minutes Intravenous Every M-W-F (Hemodialysis) 05/20/16 1027 05/25/16 1126   05/20/16 1200  vancomycin (VANCOCIN) 2,250 mg in sodium chloride 0.9 % 250 mL IVPB     2,250 mg 250 mL/hr over 60 Minutes Intravenous Every Mon (Hemodialysis) 05/20/16 1024 05/20/16 1513   05/20/16 1030  piperacillin-tazobactam (ZOSYN) IVPB 3.375 g  Status:  Discontinued     3.375 g 12.5 mL/hr over 240 Minutes Intravenous Every 12 hours 05/20/16 1012 05/27/16 1219   05/16/16 1630  cefTRIAXone (ROCEPHIN) 1 g in dextrose 5 % 50 mL IVPB  Status:  Discontinued     1 g 100 mL/hr over 30 Minutes Intravenous Every 24 hours 05/16/16 1625 05/17/16 1641   05/14/16 1430  cefTAZidime (FORTAZ) 1 g in dextrose 5 % 50 mL IVPB  Status:  Discontinued     1 g 100 mL/hr over 30 Minutes Intravenous Every 24  hours 05/14/16 1406 05/15/16 1325   05/14/16 1430  doxycycline (VIBRA-TABS) tablet 100 mg  Status:  Discontinued     100 mg Oral Every 12 hours 05/14/16 1406 05/15/16 1325   05/14/16 0000  ceFAZolin (ANCEF) IVPB 1 g/50 mL premix  Status:  Discontinued    Comments:  Send with pt to OR   1 g 100 mL/hr over 30 Minutes Intravenous On call 05/13/16 1630 05/15/16 1325   05/12/16 1330  cefTAZidime (FORTAZ) 1 g in dextrose 5 % 50 mL IVPB  Status:  Discontinued     1 g 100 mL/hr over 30 Minutes Intravenous Every 24 hours 05/12/16 1319 05/14/16 1406   05/08/16 1000  vancomycin (VANCOCIN) IVPB 1000 mg/200 mL premix     1,000 mg 200 mL/hr over 60 Minutes Intravenous To Surgery 05/07/16 1904 05/08/16 1004   05/08/16 0902  vancomycin (VANCOCIN) 1-5 GM/200ML-% IVPB    Comments:  Matilde Haymaker, [name]   : cabinet override      05/08/16 0902 05/08/16 0904   05/07/16 1300  ceFAZolin (ANCEF) 3 g in dextrose 5 % 50 mL IVPB     3 g 130 mL/hr over 30 Minutes Intravenous To Radiology 05/07/16 1232 05/07/16 1330   05/02/16 2200  doxycycline (VIBRA-TABS) tablet 100 mg  Status:  Discontinued     100 mg Oral Every 12 hours 05/02/16 1732 05/14/16 1406   05/01/16 2200  piperacillin-tazobactam (ZOSYN) IVPB 2.25 g  Status:  Discontinued     2.25 g 100 mL/hr over 30 Minutes Intravenous Every 8 hours 05/01/16 1324 05/02/16 1729   05/01/16 1330  vancomycin (VANCOCIN) 2,500 mg in sodium chloride 0.9 % 500 mL IVPB     2,500 mg 250 mL/hr over 120 Minutes Intravenous  Once 05/01/16 1309 05/01/16 1551   05/01/16 1300  piperacillin-tazobactam (ZOSYN) IVPB 3.375 g     3.375 g 100 mL/hr over 30 Minutes Intravenous  Once 05/01/16 1258 05/01/16 1340   05/01/16 1300  vancomycin (VANCOCIN) IVPB 1000 mg/200 mL premix  Status:  Discontinued     1,000 mg 200 mL/hr over 60 Minutes Intravenous  Once 05/01/16 1258 05/01/16 1308       Assessment/Plan left thigh soft tissue infection - s/p excision of 15x6x2.5cm area of skin and sub  tissue - continue negative pressure wound therapy   Acute metabolic encephalopathy/Hx of CVA with residual deficit ESRD HD MWF, in chair. 30 kg down but still edema. Pulmonary edema/Acute respiratory failure - now resolved on room air HTN  AODM with  poor control - diabetic neuropathy Anemia HPTH vit d Body mass index is 49.53 kg/m. Immobility Fevers undergoing eval Anxiety FEN:Renal diet ID: Zosyn/Vancomycin per ZOX:WRUEAcy 05/20/16 =>>day 5(Multiple organism nothing predominate) DVT: Heparin  Plan: continue VAC, we will continue to follow    LOS: 26 days    Adam Phenix , [facility] 05/27/2016, 12:36 PM Pager: [phone] Consults: [phone] Mon-Fri 7:00 am-4:30 pm Sat-Sun 7:00 am-11:30 am

## 2016-05-27 NOTE — Procedures (Signed)
Patient seen and examined on Hemodialysis. QB 400, UF goal 3L.  Lying in bed, very frail and deconditioned Treatment adjusted as needed.  Bufford Buttner MD 12:06 PM

## 2016-05-27 NOTE — Progress Notes (Signed)
  [facility] Progress Note   Assessment/ Plan:   1. Acute encephalopathy- ? Component of delirium.  Was more responsive 4/14 according to notes  CT head negative 4/13 2. ESRD on HD MWF, providing HD on schedule 3. 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 Nutrition: albumin 1.9 6. Hypertension:not on any meds at this time 7.  Thigh wounds- s/p debridement, wound vac in place, and on antibiotics (Zosyn)   Subjective:    Still not very responsive.     Objective:   BP 129/85   Pulse (!) 102   Temp 98.6 F (37 C) (Oral)   Resp (!) 22   Ht  (1.676 m)   Wt 135.7 kg (299 lb 2.6 oz)   LMP 04/11/2016   SpO2 98%   BMI 48.29 kg/m   Physical Exam: Gen: lying in bed. Will respond to noxious stimuli (especially light palpation of L leg wound) CVS:RRR Resp: nonlabored ZOX:WRUEA Ext:L medial thigh wound + woundvac, very tender  Labs: BMET  Recent Labs Lab 05/22/16 0403 05/23/16 0503 05/24/16 0452 05/24/16 0730 05/25/16 0405 05/26/16 0517 05/27/16 0453  NA 136 132* 134* 134* 135 138 139  K 4.2 3.7 4.2 3.9 3.4* 3.5 3.9  CL 93* 95* 94* 94* 93* 96* 95*  CO2 GLUCOSE 200* 153* 195* 203* 178* 211* 182*  BUN 65* 29* 49* 50* 26* 46* 61*  CREATININE 5.03* 3.02* 4.38* 4.47* 3.20* 4.60* 5.54*  CALCIUM 9.3 8.7* 9.0 9.0 9.0 9.1 9.5  PHOS 6.3* 4.1 5.6* 5.5* 4.1 4.5 4.9*   CBC  Recent Labs Lab 05/25/16 0405 05/26/16 0517 05/27/16 0453 05/27/16 0659  WBC 14.5* 15.2* 15.9* 15.7*  HGB 8.1* 8.6* 8.7* 8.8*  HCT 26.5* 28.6* 28.5* 29.1*  MCV 88.6 89.7 88.8 89.3  PLT 377 363 347 322    @ Medications:    . albuterol  3 mL Inhalation Q M,W,F-HD  . atorvastatin  20 mg Oral Daily  . darbepoetin (ARANESP) injection - DIALYSIS  150 mcg Intravenous Q Fri-HD  . feeding supplement (NEPRO CARB STEADY)  237 mL Oral TID BM  . feeding supplement (PRO-STAT SUGAR FREE 64)  30 mL Oral BID  . gi cocktail  30 mL Oral Once  . heparin  5,000  Units Subcutaneous Q8H  . insulin aspart  0-5 Units Subcutaneous QHS  . insulin aspart  0-9 Units Subcutaneous TID WC  . insulin aspart  5 Units Subcutaneous TID WC  . insulin glargine  10 Units Subcutaneous QHS  . mouth rinse  15 mL Mouth Rinse BID  . multivitamin  1 tablet Oral QHS  . piperacillin-tazobactam (ZOSYN)  IV  3.375 g Intravenous Q12H  . polyethylene glycol  17 g Oral BID  . senna-docusate  1 tablet Oral BID  . sevelamer carbonate  800 mg Oral TID WC     Bufford Buttner, MD 05/27/2016, 11:58 AM

## 2016-05-27 NOTE — Progress Notes (Signed)
Pharmacy Antibiotic Note  [name] is a 41 y.o. female admitted on 05/01/2016 with weakness and fever. The patient was started on empiric antibiotics on 4/9 in the setting of fevers and known B/L LE inner thigh wounds. Pt is new ESRD on HD MWF, and now s/p OR on 4/10 for debridement of L thigh wound. Blood cultures remain no growth to date, and wound cultures with multiple organisms present. Plans are to transition from Vancomycin + Zosyn to Unasyn monotherapy. ESRD-MWF  Plan: 1. Start Unasyn 3g IV every 24 hours 2. Pharmacy will sign off of the consult as no dose adjustments are expected at this time but will continue to monitor peripherally  Height:  (167.6 cm) Weight: 298 lb 15.1 oz (135.6 kg) IBW/kg (Calculated) : 59.3  Temp (24hrs), Avg:98.2 F (36.8 C), Min:97.8 F (36.6 C), Max:98.6 F (37 C)   Recent Labs Lab 05/24/16 0451 05/24/16 0452 05/24/16 0730 05/25/16 0405 05/26/16 0517 05/27/16 0453 05/27/16 0659  WBC 14.0*  --   --  14.5* 15.2* 15.9* 15.7*  CREATININE  --  4.38* 4.47* 3.20* 4.60* 5.54*  --   VANCORANDOM  --   --   --  36  --   --   --     Estimated Creatinine Clearance: 19.3 mL/min (A) (by C-G formula based on SCr of 5.54 mg/dL (H)).    Allergies  Allergen Reactions  . Azithromycin Other (See Comments)    Nose bleeding event    Antimicrobials this admission: 3/21 Vanc>>3/22; restart 4/9 >> 4/16 3/21 Zosyn>>3/22; restart 4/9 >> 4/16 3/22 Doxy>>4/4 4/1 Ceftaz >>4/4 4/16 Unasyn >>  Dose adjustments this admission: 3/23 @ 0530:  RVL 25 after  on 3/23.  Drug d/c'd. 4/14 VR: 36 on 1g QHD -> hold dose x1 HD session, est level ~20 post-HD, then continue  QHD, est level ~18.  Microbiology results: 4/10 Wound Cx: multiple organisms present, none predominant (pending) 4/8 BCx: NGTD 4/5 UC: negative 4/4 Bcx: NGTD 3/21BCx: CONS in 1/2 bottles 3/21 UCx: negative 3/21 HIV Ab: negative  Thank you for allowing pharmacy  to be a part of this patient's care.  Georgina Pillion, PharmD, BCPS Clinical Pharmacist Pager: [phone] Clinical phone for 05/27/2016 from 7a-3:30p: [phone] If after 3:30p, please call main pharmacy at: [phone] 05/27/2016 12:57 PM

## 2016-05-27 NOTE — Progress Notes (Addendum)
PROGRESS NOTE  [name]  ZOX:[id number] DOB: 1975/04/18 DOA: 05/01/2016 PCP: Marletta Lor, NP  Brief Narrative:  41 y.o.femalewith medical history significant formorbid obesity, chronic kidney disease stage IV-V, diabetes mellitus, hypertension, prior CVA, diastolic CHF who presented to ED with progressive weakness, fever for past 1 week prior to this admission. She is actually bed bound for past year or so per her husband report ever since stroke. She also had dry cough and shortness of breath for past 1 week.  Her creatinine was 7.9 (much worse since her recent Cr values of 4-5).  She was started on hemodialysis and underwent left upper arm AV graft placement on 4/3.  She was anticipating going home with her family but because she is too weak to assist with transfers and is morbidly obese, she would need a hoyer lift, hospital bed, and wheelchair for transport to dialysis.  Unfortunately, she was given a hospital bed last year which was "lost" and no home health agencies in the region will provide her with equipment.  She was told that she will need to go to SNF for short term rehabilitation to regain enough strength to at least transfer from a low bed/mattress to a chair in order to get to HD.  She has been having intermittent fevers without defined source.  Her fevers appeared to get better with cessation of antibiotics, however, they returned.  This may be due to a gangrenous area on the posterior left thigh which was debrided by general surgery on 4/10 in the OR.   Assessment & Plan:   Principal Problem:   Acute metabolic encephalopathy Active Problems:   Morbid obesity (HCC)   Essential hypertension   History of CVA with residual deficit   Anemia of chronic disease   Lymphedema   Debility   Acute pulmonary edema (HCC)   Acute renal failure superimposed on stage 5 chronic kidney disease, not on chronic dialysis (HCC)   Acute respiratory failure with hypoxia (HCC)   Right  middle lobe pneumonia (HCC)   Leukocytosis   Uncontrolled diabetes mellitus with diabetic nephropathy, with long-term current use of insulin (HCC)   Dyslipidemia associated with type 2 diabetes mellitus (HCC)   ESRD (end stage renal disease) (HCC)   Goals of care, counseling/discussion   Palliative care by specialist  ESRD with acute pulmonary edema -  Patient was initially placed on aggressive IV Lasix and metolazone and progressed to ESRD - Tunneled dialysis cath Placed on 3/27. Hemodialysis initiated 3/28, and has continued - Accepted at [facility] [address]  - Vascular surgery also following, AV graft placement 4/3  Gangrenous left posterior leg ulcer -  She had coag negative staph from 1/2 blood cultures on 3/21 which was felt to be contaminant.  Other cultures are so far negative.   -  CT scan of chest was reviewed, from 3/22:  No evidence of abscess or source of fever -  Recent CXR and urine culture have been negative -  Repeat blood culture:  NGTD -  Appreciate wound care assistance -  General surgery consulted > s/p OR debridement 4/10 under general anesthesia -  Resume vancomycin and zosyn, wound care per surgery team -  Surgery ordered for wound vac 4/13 -  Antibiotics de-escalated to unasyn 4/16  Morbid obesity / Debility/ generalized weakness.  Unable to assist with transfers -  No home health agency will provide equipment and services to home because of lost equipment in the past -  SW consulted for SNF placement -  Nutrition consulted - appreciate recommendations -  Body mass index is 54.58 kg/m. -  Wt has gone down since admission  Acute respiratory failure with hypoxia Resolved now.     Acute metabolic encephalopathy - less alert than baseline mental status per family - Likely multifactorial from history of CVA, end stage renal disease, possible uremia, infectious process 4/14: more alert and responsive and vocalizing better, CT head negative for  acute findings.  -4/16: pt is less responsive more lethargic today  Chest pain episode  -  Troponin 1 was 0.04 however d-dimer was 2.01 - VQ scan Negative for any PE  Anemia of chronic disease / IDA - Status post 2 units PRBC transfusion on 05/02/2016  - Aranesp 3/22, continue iron supplementation, B12  Oral Thrush - fluconazole x 1, oral nystatin swish and swallow QID  Uncontrolled diabetes mellitus with diabetic nephropathy with long term insulin use, hyperglycemic - continue lantus, increase to 14 units - start aspart 3 units TIC AC -  Continue low dose SSI  CBG (last 3)   Recent Labs  05/26/16 1232 05/26/16 1625 05/26/16 2125  GLUCAP 215* 263* 205*   Dyslipidemia associated with type 2 DM - Continue statin therapy   Essential hypertension, blood pressures stable, controlled, holding home BP meds  Bilateral inner thigh wounds, left leg developed foul odor and area of central gangrene.     -  Related to moisture and friction from thighs rubbing together -  Appreciate general surgery assistance, debrided on 4/10 -  Antibiotics as ordered  Depression associated with chronic illness - Pt seemed much more animated and lively when husband present in room. When no family present patient becomes very withdrawn and nonvocal and doesn't want to eat.  I will ask for psychiatry to do an assessment as patient has more clinical improvement.   DVT prophylaxis:  heparin Code Status:  full Family Communication:  Patient alone, spoke with sister and husband 4/11, husband bedside 4/16 Disposition Plan:   SNF.     Consultants:   Nephrology  WOC  Palliative care   Nutrition  Vascular surgery   Procedures:  Hemodialysis initiated 3/28  Antimicrobials:  Anti-infectives    Start     Dose/Rate Route Frequency Ordered Stop   05/29/16 1200  vancomycin (VANCOCIN) 500 mg in sodium chloride 0.9 % 100 mL IVPB  Status:  Discontinued     500 mg 100 mL/hr over 60 Minutes  Intravenous Every M-W-F (Hemodialysis) 05/25/16 1126 05/27/16 0855   05/27/16 1122  vancomycin (VANCOCIN) 500-5 MG/100ML-% IVPB  Status:  Discontinued    Comments:  Herriott, [name]   : cabinet override      05/27/16 1122 05/27/16 1126   05/22/16 1200  vancomycin (VANCOCIN) 1,250 mg in sodium chloride 0.9 % 250 mL IVPB  Status:  Discontinued     1,250 mg 250 mL/hr over 60 Minutes Intravenous Every M-W-F (Hemodialysis) 05/20/16 1024 05/20/16 1027   05/22/16 1200  vancomycin (VANCOCIN) IVPB 1000 mg/200 mL premix  Status:  Discontinued     1,000 mg 200 mL/hr over 60 Minutes Intravenous Every M-W-F (Hemodialysis) 05/20/16 1027 05/25/16 1126   05/20/16 1200  vancomycin (VANCOCIN) 2,250 mg in sodium chloride 0.9 % 250 mL IVPB     2,250 mg 250 mL/hr over 60 Minutes Intravenous Every Mon (Hemodialysis) 05/20/16 1024 05/20/16 1513   05/20/16 1030  piperacillin-tazobactam (ZOSYN) IVPB 3.375 g     3.375 g 12.5 mL/hr over 240  Minutes Intravenous Every 12 hours 05/20/16 1012     05/16/16 1630  cefTRIAXone (ROCEPHIN) 1 g in dextrose 5 % 50 mL IVPB  Status:  Discontinued     1 g 100 mL/hr over 30 Minutes Intravenous Every 24 hours 05/16/16 1625 05/17/16 1641   05/14/16 1430  cefTAZidime (FORTAZ) 1 g in dextrose 5 % 50 mL IVPB  Status:  Discontinued     1 g 100 mL/hr over 30 Minutes Intravenous Every 24 hours 05/14/16 1406 05/15/16 1325   05/14/16 1430  doxycycline (VIBRA-TABS) tablet 100 mg  Status:  Discontinued     100 mg Oral Every 12 hours 05/14/16 1406 05/15/16 1325   05/14/16 0000  ceFAZolin (ANCEF) IVPB 1 g/50 mL premix  Status:  Discontinued    Comments:  Send with pt to OR   1 g 100 mL/hr over 30 Minutes Intravenous On call 05/13/16 1630 05/15/16 1325   05/12/16 1330  cefTAZidime (FORTAZ) 1 g in dextrose 5 % 50 mL IVPB  Status:  Discontinued     1 g 100 mL/hr over 30 Minutes Intravenous Every 24 hours 05/12/16 1319 05/14/16 1406   05/08/16 1000  vancomycin (VANCOCIN) IVPB 1000 mg/200 mL  premix     1,000 mg 200 mL/hr over 60 Minutes Intravenous To Surgery 05/07/16 1904 05/08/16 1004   05/08/16 0902  vancomycin (VANCOCIN) 1-5 GM/200ML-% IVPB    Comments:  Matilde Haymaker, [name]   : cabinet override      05/08/16 0902 05/08/16 0904   05/07/16 1300  ceFAZolin (ANCEF) 3 g in dextrose 5 % 50 mL IVPB     3 g 130 mL/hr over 30 Minutes Intravenous To Radiology 05/07/16 1232 05/07/16 1330   05/02/16 2200  doxycycline (VIBRA-TABS) tablet 100 mg  Status:  Discontinued     100 mg Oral Every 12 hours 05/02/16 1732 05/14/16 1406   05/01/16 2200  piperacillin-tazobactam (ZOSYN) IVPB 2.25 g  Status:  Discontinued     2.25 g 100 mL/hr over 30 Minutes Intravenous Every 8 hours 05/01/16 1324 05/02/16 1729   05/01/16 1330  vancomycin (VANCOCIN) 2,500 mg in sodium chloride 0.9 % 500 mL IVPB     2,500 mg 250 mL/hr over 120 Minutes Intravenous  Once 05/01/16 1309 05/01/16 1551   05/01/16 1300  piperacillin-tazobactam (ZOSYN) IVPB 3.375 g     3.375 g 100 mL/hr over 30 Minutes Intravenous  Once 05/01/16 1258 05/01/16 1340   05/01/16 1300  vancomycin (VANCOCIN) IVPB 1000 mg/200 mL premix  Status:  Discontinued     1,000 mg 200 mL/hr over 60 Minutes Intravenous  Once 05/01/16 1258 05/01/16 1308     Subjective: Pt is less arousable this morning, seen in HD, also seen in room with husband later in day and much more alert and interactive  Objective: Vitals:   05/27/16 1045 05/27/16 1115 05/27/16 1144 05/27/16 1145  BP: 112/71 131/79 129/85 137/80  Pulse: (!) 101 98 (!) 102 96  Resp:  (!) 23 (!) 22 (!) 23  Temp:    98.2 F (36.8 C)  TempSrc:    Oral  SpO2:    98%  Weight:    135.6 kg (298 lb 15.1 oz)  Height:        Intake/Output Summary (Last 24 hours) at 05/27/16 1218 Last data filed at 05/27/16 1145  Gross per 24 hour  Intake              280 ml  Output  0 ml  Net              280 ml   Filed Weights   05/27/16 0500 05/27/16 0715 05/27/16 1145  Weight: 135.6 kg (298 lb  15.1 oz) 135.7 kg (299 lb 2.6 oz) 135.6 kg (298 lb 15.1 oz)    Examination:  General exam:  Adult female, obese.  Sleepy but arousable.   HEENT:  NCAT, MM dry, oral thrush seen.  Respiratory system: Clear to auscultation bilaterally Cardiovascular system:  normal S1/S2. No murmurs, rubs, gallops or clicks.  Warm extremities Gastrointestinal system: Normal active bowel sounds, soft, nondistended, nontender. MSK:  Leg wound has wound vac on.  Normal tone and bulk, legs appear wrinkled as if previous swelling has improved.  Neuro:  Diffusely weak, decreased muscle tone and bulk  Data Reviewed: I have personally reviewed following labs and imaging studies  CBC:  Recent Labs Lab 05/24/16 0451 05/25/16 0405 05/26/16 0517 05/27/16 0453 05/27/16 0659  WBC 14.0* 14.5* 15.2* 15.9* 15.7*  HGB 8.2* 8.1* 8.6* 8.7* 8.8*  HCT 26.8* 26.5* 28.6* 28.5* 29.1*  MCV 88.2 88.6 89.7 88.8 89.3  PLT 376 377 363 347 322   Basic Metabolic Panel:  Recent Labs Lab 05/24/16 0452 05/24/16 0730 05/25/16 0405 05/26/16 0517 05/27/16 0453  NA 134* 134* 135 138 139  K 4.2 3.9 3.4* 3.5 3.9  CL 94* 94* 93* 96* 95*  CO2 GLUCOSE 195* 203* 178* 211* 182*  BUN 49* 50* 26* 46* 61*  CREATININE 4.38* 4.47* 3.20* 4.60* 5.54*  CALCIUM 9.0 9.0 9.0 9.1 9.5  PHOS 5.6* 5.5* 4.1 4.5 4.9*   GFR: Estimated Creatinine Clearance: 19.3 mL/min (A) (by C-G formula based on SCr of 5.54 mg/dL (H)). Liver Function Tests:  Recent Labs Lab 05/24/16 0452 05/24/16 0730 05/25/16 0405 05/26/16 0517 05/27/16 0453  ALBUMIN 2.0* 1.8* 1.8* 1.8* 1.9*   No results for input(s): LIPASE, AMYLASE in the last 168 hours. No results for input(s): AMMONIA in the last 168 hours. Coagulation Profile: No results for input(s): INR, PROTIME in the last 168 hours. Cardiac Enzymes: No results for input(s): CKTOTAL, CKMB, CKMBINDEX, TROPONINI in the last 168 hours. BNP (last 3 results) No results for input(s): PROBNP in  the last 8760 hours. HbA1C: No results for input(s): HGBA1C in the last 72 hours. CBG:  Recent Labs Lab 05/25/16 2033 05/26/16 0800 05/26/16 1232 05/26/16 1625 05/26/16 2125  GLUCAP 176* 226* 215* 263* 205*   Lipid Profile: No results for input(s): CHOL, HDL, LDLCALC, TRIG, CHOLHDL, LDLDIRECT in the last 72 hours. Thyroid Function Tests: No results for input(s): TSH, T4TOTAL, FREET4, T3FREE, THYROIDAB in the last 72 hours. Anemia Panel: No results for input(s): VITAMINB12, FOLATE, FERRITIN, TIBC, IRON, RETICCTPCT in the last 72 hours. Urine analysis:    Component Value Date/Time   COLORURINE AMBER (A) 05/16/2016 1142   APPEARANCEUR TURBID (A) 05/16/2016 1142   LABSPEC 1.019 05/16/2016 1142   PHURINE 5.0 05/16/2016 1142   GLUCOSEU NEGATIVE 05/16/2016 1142   HGBUR MODERATE (A) 05/16/2016 1142   BILIRUBINUR NEGATIVE 05/16/2016 1142   KETONESUR NEGATIVE 05/16/2016 1142   PROTEINUR 100 (A) 05/16/2016 1142   UROBILINOGEN 0.2 10/26/2013 1721   NITRITE NEGATIVE 05/16/2016 1142   LEUKOCYTESUR MODERATE (A) 05/16/2016 1142    Recent Results (from the past 240 hour(s))  Culture, blood (Routine X 2) w Reflex to ID Panel     Status: None   Collection Time: 05/19/16  3:59 PM  Result Value Ref Range Status   Specimen Description BLOOD RIGHT HAND  Final   Special Requests IN PEDIATRIC BOTTLE Blood Culture adequate volume  Final   Culture NO GROWTH 5 DAYS  Final   Report Status 05/24/2016 FINAL  Final  Culture, blood (Routine X 2) w Reflex to ID Panel     Status: None   Collection Time: 05/19/16  3:59 PM  Result Value Ref Range Status   Specimen Description BLOOD LEFT HAND  Final   Special Requests IN PEDIATRIC BOTTLE Blood Culture adequate volume  Final   Culture NO GROWTH 5 DAYS  Final   Report Status 05/24/2016 FINAL  Final  Surgical pcr screen     Status: None   Collection Time: 05/21/16  1:27 AM  Result Value Ref Range Status   MRSA, PCR NEGATIVE NEGATIVE Final    Staphylococcus aureus NEGATIVE NEGATIVE Final    Comment:        The Xpert SA Assay (FDA approved for NASAL specimens in patients over 74 years of age), is one component of a comprehensive surveillance program.  Test performance has been validated by [facility] for patients greater than or equal to 41 year old. It is not intended to diagnose infection nor to guide or monitor treatment.   Aerobic/Anaerobic Culture (surgical/deep wound)     Status: Abnormal (Preliminary result)   Collection Time: 05/21/16  9:35 AM  Result Value Ref Range Status   Specimen Description WOUND LEFT THIGH  Final   Special Requests NONE  Final   Gram Stain   Final    RARE WBC PRESENT, PREDOMINANTLY PMN FEW GRAM POSITIVE RODS    Culture (A)  Final    MULTIPLE ORGANISMS PRESENT, NONE PREDOMINANT HOLDING FOR POSSIBLE ANAEROBE    Report Status PENDING  Incomplete   Radiology Studies: No results found.  Scheduled Meds: . albuterol  3 mL Inhalation Q M,W,F-HD  . atorvastatin  20 mg Oral Daily  . darbepoetin (ARANESP) injection - DIALYSIS  150 mcg Intravenous Q Fri-HD  . feeding supplement (NEPRO CARB STEADY)  237 mL Oral TID BM  . feeding supplement (PRO-STAT SUGAR FREE 64)  30 mL Oral BID  . gi cocktail  30 mL Oral Once  . heparin  5,000 Units Subcutaneous Q8H  . insulin aspart  0-5 Units Subcutaneous QHS  . insulin aspart  0-9 Units Subcutaneous TID WC  . insulin aspart  5 Units Subcutaneous TID WC  . insulin glargine  10 Units Subcutaneous QHS  . mouth rinse  15 mL Mouth Rinse BID  . multivitamin  1 tablet Oral QHS  . piperacillin-tazobactam (ZOSYN)  IV  3.375 g Intravenous Q12H  . polyethylene glycol  17 g Oral BID  . senna-docusate  1 tablet Oral BID  . sevelamer carbonate  800 mg Oral TID WC   Continuous Infusions:   LOS: 26 days   Time spent: 25 min  Standley Dakins, MD [facility] Pager [phone]  If 7PM-7AM, please contact night-coverage [email or web address] Password  [facility] 05/27/2016, 12:18 PM

## 2016-05-28 DIAGNOSIS — D72829 Elevated white blood cell count, unspecified: Secondary | ICD-10-CM

## 2016-05-28 LAB — GLUCOSE, CAPILLARY
GLUCOSE-CAPILLARY: 186 mg/dL — AB (ref 65–99)
GLUCOSE-CAPILLARY: 378 mg/dL — AB (ref 65–99)
Glucose-Capillary: 176 mg/dL — ABNORMAL HIGH (ref 65–99)
Glucose-Capillary: 300 mg/dL — ABNORMAL HIGH (ref 65–99)

## 2016-05-28 LAB — RENAL FUNCTION PANEL
Albumin: 1.9 g/dL — ABNORMAL LOW (ref 3.5–5.0)
Anion gap: 16 — ABNORMAL HIGH (ref 5–15)
BUN: 27 mg/dL — ABNORMAL HIGH (ref 6–20)
CHLORIDE: 96 mmol/L — AB (ref 101–111)
CO2: 26 mmol/L (ref 22–32)
CREATININE: 3.37 mg/dL — AB (ref 0.44–1.00)
Calcium: 9.2 mg/dL (ref 8.9–10.3)
GFR calc non Af Amer: 16 mL/min — ABNORMAL LOW (ref >60)
GFR, EST AFRICAN AMERICAN: 19 mL/min — AB (ref >60)
GLUCOSE: 178 mg/dL — AB (ref 65–99)
Phosphorus: 2.9 mg/dL (ref 2.5–4.6)
Potassium: 3.5 mmol/L (ref 3.5–5.1)
Sodium: 138 mmol/L (ref 135–145)

## 2016-05-28 LAB — CBC
HCT: 30.8 % — ABNORMAL LOW (ref 36.0–46.0)
Hemoglobin: 9.3 g/dL — ABNORMAL LOW (ref 12.0–15.0)
MCH: 27.1 pg (ref 26.0–34.0)
MCHC: 30.2 g/dL (ref 30.0–36.0)
MCV: 89.8 fL (ref 78.0–100.0)
PLATELETS: 328 10*3/uL (ref 150–400)
RBC: 3.43 MIL/uL — ABNORMAL LOW (ref 3.87–5.11)
RDW: 16.4 % — ABNORMAL HIGH (ref 11.5–15.5)
WBC: 15.1 10*3/uL — ABNORMAL HIGH (ref 4.0–10.5)

## 2016-05-28 LAB — AEROBIC/ANAEROBIC CULTURE (SURGICAL/DEEP WOUND)

## 2016-05-28 MED ORDER — ACETAMINOPHEN 325 MG PO TABS
650.0000 mg | ORAL_TABLET | Freq: Four times a day (QID) | ORAL | Status: DC
  Administered 2016-05-28 – 2016-05-30 (×10): 650 mg via ORAL
  Filled 2016-05-28 (×11): qty 2

## 2016-05-28 MED ORDER — PRO-STAT SUGAR FREE PO LIQD
30.0000 mL | Freq: Three times a day (TID) | ORAL | Status: DC
  Administered 2016-05-28 – 2016-05-30 (×5): 30 mL via ORAL
  Filled 2016-05-28 (×8): qty 30

## 2016-05-28 NOTE — Progress Notes (Signed)
PROGRESS NOTE  [name]  ZOX:[id number] DOB: [birth year] DOA: 05/01/2016 PCP: Marletta Lor, NP  Brief Narrative:  41 y.o.femalewith medical history significant formorbid obesity, chronic kidney disease stage IV-V, diabetes mellitus, hypertension, prior CVA, diastolic CHF who presented to ED with progressive weakness, fever for past 1 week prior to this admission. She is actually bed bound for past year or so per her husband report ever since stroke. She also had dry cough and shortness of breath for past 1 week.  Her creatinine was 7.9 (much worse since her recent Cr values of 4-5).  She was started on hemodialysis and underwent left upper arm AV graft placement on 4/3.  She was anticipating going home with her family but because she is too weak to assist with transfers and is morbidly obese, she would need a hoyer lift, hospital bed, and wheelchair for transport to dialysis.  Unfortunately, she was given a hospital bed last year which was "lost" and no home health agencies in the region will provide her with equipment.  She was told that she will need to go to SNF for short term rehabilitation to regain enough strength to at least transfer from a low bed/mattress to a chair in order to get to HD.  She has been having intermittent fevers without defined source.  Her fevers appeared to get better with cessation of antibiotics, however, they returned.  This may be due to a gangrenous area on the posterior left thigh which was debrided by general surgery on 4/10 in the OR.   Assessment & Plan:   Principal Problem:   Acute metabolic encephalopathy Active Problems:   Morbid obesity (HCC)   Essential hypertension   History of CVA with residual deficit   Anemia of chronic disease   Lymphedema   Debility   Acute pulmonary edema (HCC)   Acute renal failure superimposed on stage 5 chronic kidney disease, not on chronic dialysis (HCC)   Acute respiratory failure with hypoxia (HCC)   Right  middle lobe pneumonia (HCC)   Leukocytosis   Uncontrolled diabetes mellitus with diabetic nephropathy, with long-term current use of insulin (HCC)   Dyslipidemia associated with type 2 diabetes mellitus (HCC)   ESRD (end stage renal disease) (HCC)   Goals of care, counseling/discussion   Palliative care by specialist   Pressure injury of skin  ESRD with acute pulmonary edema -  Patient was initially placed on aggressive IV Lasix and metolazone and progressed to ESRD - Tunneled dialysis cath Placed on 3/27. Hemodialysis initiated 3/28, and has continued - Accepted at [facility] [address]  - Vascular surgery also following, AV graft placement 4/3  Gangrenous left posterior leg ulcer -  She had coag negative staph from 1/2 blood cultures on 3/21 which was felt to be contaminant.  Other cultures are so far negative.   -  CT scan of chest was reviewed, from 3/22:  No evidence of abscess or source of fever -  Recent CXR and urine culture have been negative -  Repeat blood culture:  NGTD -  Appreciate wound care assistance -  General surgery consulted > s/p OR debridement 4/10 under general anesthesia -  Resume vancomycin and zosyn, wound care per surgery team -  Surgery ordered for wound vac 4/13 -  Antibiotics de-escalated to unasyn 4/16  Morbid obesity / Debility/ generalized weakness.  Unable to assist with transfers -  No home health agency will provide equipment and services to home because of lost  equipment in the past -  SW consulted for SNF placement -  Nutrition consulted - appreciate recommendations -  Body mass index is 54.58 kg/m. -  Wt has markedly gone down since admission  Acute respiratory failure with hypoxia Resolved now.     Acute metabolic encephalopathy - alertness has been waxing and waning - Likely multifactorial from history of CVA, end stage renal disease, possible uremia, infectious process 4/14: more alert and responsive and vocalizing better,  CT head negative for acute findings.  -4/16: pt is less responsive more lethargic today  Chest pain episode (resolved) -  Troponin 1 was 0.04 however d-dimer was 2.01 - VQ scan Negative for any PE  Anemia of chronic disease / IDA - Status post 2 units PRBC transfusion on 05/02/2016  - Aranesp 3/22, continue iron supplementation, B12  Oral Thrush - fluconazole x 1, oral nystatin swish and swallow QID started 4/16  Uncontrolled diabetes mellitus with diabetic nephropathy with long term insulin use, hyperglycemic - continue lantus, increase to 14 units - start aspart 3 units TIC AC -  Continue low dose SSI  CBG (last 3)   Recent Labs  05/27/16 2106 05/28/16 0758 05/28/16 1154  GLUCAP 154* 186* 176*   Dyslipidemia associated with type 2 DM - Continue statin therapy   Essential hypertension, blood pressures stable, controlled, holding home BP meds  Bilateral inner thigh wounds, left leg developed foul odor and area of central gangrene.     -  Related to moisture and friction from thighs rubbing together -  Appreciate general surgery assistance, debrided on 4/10, wound vac in place -  Antibiotics as ordered  Depression associated with chronic illness - Pt seemed much more animated and lively when husband and family present in room and at other times she is deeply withdrawn and not participating at all in her ADLs or self care. When no family present patient becomes very withdrawn and nonvocal and doesn't want to eat.  I will ask for psychiatry to do an assessment but she will need to participate, likely will need to do it when family is present.   DVT prophylaxis:  heparin Code Status:  full Family Communication:  Patient alone, spoke with sister and husband 4/11, husband bedside 4/16, sister phone 4/17 I asked sister to come and visit today if she could by patient's request and to help lift patient's spirits Disposition Plan:   SNF she was accepted at [facility], she needs  to be able to get to dialysis treatments which has been a challenge, will try to get her into chair with hoya lift 4/17  Consultants:   Nephrology  WOC  Palliative care   Nutrition  Vascular surgery   Procedures:  Hemodialysis initiated 3/28  Antimicrobials:  Anti-infectives    Start     Dose/Rate Route Frequency Ordered Stop   05/29/16 1200  vancomycin (VANCOCIN) 500 mg in sodium chloride 0.9 % 100 mL IVPB  Status:  Discontinued     500 mg 100 mL/hr over 60 Minutes Intravenous Every M-W-F (Hemodialysis) 05/25/16 1126 05/27/16 0855   05/27/16 2000  Ampicillin-Sulbactam (UNASYN) 3 g in sodium chloride 0.9 % 100 mL IVPB     3 g 200 mL/hr over 30 Minutes Intravenous Every 24 hours 05/27/16 1256     05/27/16 1530  fluconazole (DIFLUCAN) tablet 100 mg     100 mg Oral  Once 05/27/16 1530 05/27/16 1714   05/27/16 1122  vancomycin (VANCOCIN) 500-5 MG/100ML-% IVPB  Status:  Discontinued    Comments:  Herriott, [name]   : cabinet override      05/27/16 1122 05/27/16 1126   05/22/16 1200  vancomycin (VANCOCIN) 1,250 mg in sodium chloride 0.9 % 250 mL IVPB  Status:  Discontinued     1,250 mg 250 mL/hr over 60 Minutes Intravenous Every M-W-F (Hemodialysis) 05/20/16 1024 05/20/16 1027   05/22/16 1200  vancomycin (VANCOCIN) IVPB 1000 mg/200 mL premix  Status:  Discontinued     1,000 mg 200 mL/hr over 60 Minutes Intravenous Every M-W-F (Hemodialysis) 05/20/16 1027 05/25/16 1126   05/20/16 1200  vancomycin (VANCOCIN) 2,250 mg in sodium chloride 0.9 % 250 mL IVPB     2,250 mg 250 mL/hr over 60 Minutes Intravenous Every Mon (Hemodialysis) 05/20/16 1024 05/20/16 1513   05/20/16 1030  piperacillin-tazobactam (ZOSYN) IVPB 3.375 g  Status:  Discontinued     3.375 g 12.5 mL/hr over 240 Minutes Intravenous Every 12 hours 05/20/16 1012 05/27/16 1219   05/16/16 1630  cefTRIAXone (ROCEPHIN) 1 g in dextrose 5 % 50 mL IVPB  Status:  Discontinued     1 g 100 mL/hr over 30 Minutes Intravenous Every 24  hours 05/16/16 1625 05/17/16 1641   05/14/16 1430  cefTAZidime (FORTAZ) 1 g in dextrose 5 % 50 mL IVPB  Status:  Discontinued     1 g 100 mL/hr over 30 Minutes Intravenous Every 24 hours 05/14/16 1406 05/15/16 1325   05/14/16 1430  doxycycline (VIBRA-TABS) tablet 100 mg  Status:  Discontinued     100 mg Oral Every 12 hours 05/14/16 1406 05/15/16 1325   05/14/16 0000  ceFAZolin (ANCEF) IVPB 1 g/50 mL premix  Status:  Discontinued    Comments:  Send with pt to OR   1 g 100 mL/hr over 30 Minutes Intravenous On call 05/13/16 1630 05/15/16 1325   05/12/16 1330  cefTAZidime (FORTAZ) 1 g in dextrose 5 % 50 mL IVPB  Status:  Discontinued     1 g 100 mL/hr over 30 Minutes Intravenous Every 24 hours 05/12/16 1319 05/14/16 1406   05/08/16 1000  vancomycin (VANCOCIN) IVPB 1000 mg/200 mL premix     1,000 mg 200 mL/hr over 60 Minutes Intravenous To Surgery 05/07/16 1904 05/08/16 1004   05/08/16 0902  vancomycin (VANCOCIN) 1-5 GM/200ML-% IVPB    Comments:  Matilde Haymaker, [name]   : cabinet override      05/08/16 0902 05/08/16 0904   05/07/16 1300  ceFAZolin (ANCEF) 3 g in dextrose 5 % 50 mL IVPB     3 g 130 mL/hr over 30 Minutes Intravenous To Radiology 05/07/16 1232 05/07/16 1330   05/02/16 2200  doxycycline (VIBRA-TABS) tablet 100 mg  Status:  Discontinued     100 mg Oral Every 12 hours 05/02/16 1732 05/14/16 1406   05/01/16 2200  piperacillin-tazobactam (ZOSYN) IVPB 2.25 g  Status:  Discontinued     2.25 g 100 mL/hr over 30 Minutes Intravenous Every 8 hours 05/01/16 1324 05/02/16 1729   05/01/16 1330  vancomycin (VANCOCIN) 2,500 mg in sodium chloride 0.9 % 500 mL IVPB     2,500 mg 250 mL/hr over 120 Minutes Intravenous  Once 05/01/16 1309 05/01/16 1551   05/01/16 1300  piperacillin-tazobactam (ZOSYN) IVPB 3.375 g     3.375 g 100 mL/hr over 30 Minutes Intravenous  Once 05/01/16 1258 05/01/16 1340   05/01/16 1300  vancomycin (VANCOCIN) IVPB 1000 mg/200 mL premix  Status:  Discontinued     1,000  mg 200 mL/hr over 60  Minutes Intravenous  Once 05/01/16 1258 05/01/16 1308     Subjective: Pt asked for me to call her sister and ask her to come and visit which I was able to do.   Objective: Vitals:   05/28/16 0517 05/28/16 0900 05/28/16 1030 05/28/16 1439  BP: (!) 138/94  132/62   Pulse: (!) 114  (!) 110   Resp: 16     Temp: 100.3 F (37.9 C) 98.3 F (36.8 C) (!) 100.5 F (38.1 C) 98.5 F (36.9 C)  TempSrc:  Oral Oral Axillary  SpO2: 91%  100%   Weight:      Height:        Intake/Output Summary (Last 24 hours) at 05/28/16 1511 Last data filed at 05/28/16 1433  Gross per 24 hour  Intake              515 ml  Output                0 ml  Net              515 ml   Filed Weights   05/27/16 0715 05/27/16 1145 05/27/16 2210  Weight: 135.7 kg (299 lb 2.6 oz) 135.6 kg (298 lb 15.1 oz) 135.6 kg (299 lb)    Examination:  General exam:  Adult female, obese.  Sleepy but arousable.   HEENT:  NCAT, MM dry, oral thrush seen.  Respiratory system: Clear to auscultation bilaterally Cardiovascular system:  normal S1/S2. No murmurs, rubs, gallops or clicks.  Warm extremities Gastrointestinal system: Normal active bowel sounds, soft, nondistended, nontender. MSK:  Leg wound has wound vac on.  Normal tone and bulk, legs appear wrinkled as if previous swelling has improved.  Neuro:  Diffusely weak, decreased muscle tone and bulk  Data Reviewed: I have personally reviewed following labs and imaging studies  CBC:  Recent Labs Lab 05/25/16 0405 05/26/16 0517 05/27/16 0453 05/27/16 0659 05/28/16 0449  WBC 14.5* 15.2* 15.9* 15.7* 15.1*  HGB 8.1* 8.6* 8.7* 8.8* 9.3*  HCT 26.5* 28.6* 28.5* 29.1* 30.8*  MCV 88.6 89.7 88.8 89.3 89.8  PLT 377 363 347 322 328   Basic Metabolic Panel:  Recent Labs Lab 05/24/16 0730 05/25/16 0405 05/26/16 0517 05/27/16 0453 05/28/16 0449  NA 134* 135 138 139 138  K 3.9 3.4* 3.5 3.9 3.5  CL 94* 93* 96* 95* 96*  CO2 GLUCOSE 203*  178* 211* 182* 178*  BUN 50* 26* 46* 61* 27*  CREATININE 4.47* 3.20* 4.60* 5.54* 3.37*  CALCIUM 9.0 9.0 9.1 9.5 9.2  PHOS 5.5* 4.1 4.5 4.9* 2.9   GFR: Estimated Creatinine Clearance: 31.8 mL/min (A) (by C-G formula based on SCr of 3.37 mg/dL (H)). Liver Function Tests:  Recent Labs Lab 05/24/16 0730 05/25/16 0405 05/26/16 0517 05/27/16 0453 05/28/16 0449  ALBUMIN 1.8* 1.8* 1.8* 1.9* 1.9*   No results for input(s): LIPASE, AMYLASE in the last 168 hours. No results for input(s): AMMONIA in the last 168 hours. Coagulation Profile: No results for input(s): INR, PROTIME in the last 168 hours. Cardiac Enzymes: No results for input(s): CKTOTAL, CKMB, CKMBINDEX, TROPONINI in the last 168 hours. BNP (last 3 results) No results for input(s): PROBNP in the last 8760 hours. HbA1C: No results for input(s): HGBA1C in the last 72 hours. CBG:  Recent Labs Lab 05/27/16 1229 05/27/16 1637 05/27/16 2106 05/28/16 0758 05/28/16 1154  GLUCAP 142* 150* 154* 186* 176*   Lipid Profile: No results for input(s): CHOL,  HDL, LDLCALC, TRIG, CHOLHDL, LDLDIRECT in the last 72 hours. Thyroid Function Tests: No results for input(s): TSH, T4TOTAL, FREET4, T3FREE, THYROIDAB in the last 72 hours. Anemia Panel: No results for input(s): VITAMINB12, FOLATE, FERRITIN, TIBC, IRON, RETICCTPCT in the last 72 hours. Urine analysis:    Component Value Date/Time   COLORURINE AMBER (A) 05/16/2016 1142   APPEARANCEUR TURBID (A) 05/16/2016 1142   LABSPEC 1.019 05/16/2016 1142   PHURINE 5.0 05/16/2016 1142   GLUCOSEU NEGATIVE 05/16/2016 1142   HGBUR MODERATE (A) 05/16/2016 1142   BILIRUBINUR NEGATIVE 05/16/2016 1142   KETONESUR NEGATIVE 05/16/2016 1142   PROTEINUR 100 (A) 05/16/2016 1142   UROBILINOGEN 0.2 10/26/2013 1721   NITRITE NEGATIVE 05/16/2016 1142   LEUKOCYTESUR MODERATE (A) 05/16/2016 1142    Recent Results (from the past 240 hour(s))  Culture, blood (Routine X 2) w Reflex to ID Panel      Status: None   Collection Time: 05/19/16  3:59 PM  Result Value Ref Range Status   Specimen Description BLOOD RIGHT HAND  Final   Special Requests IN PEDIATRIC BOTTLE Blood Culture adequate volume  Final   Culture NO GROWTH 5 DAYS  Final   Report Status 05/24/2016 FINAL  Final  Culture, blood (Routine X 2) w Reflex to ID Panel     Status: None   Collection Time: 05/19/16  3:59 PM  Result Value Ref Range Status   Specimen Description BLOOD LEFT HAND  Final   Special Requests IN PEDIATRIC BOTTLE Blood Culture adequate volume  Final   Culture NO GROWTH 5 DAYS  Final   Report Status 05/24/2016 FINAL  Final  Surgical pcr screen     Status: None   Collection Time: 05/21/16  1:27 AM  Result Value Ref Range Status   MRSA, PCR NEGATIVE NEGATIVE Final   Staphylococcus aureus NEGATIVE NEGATIVE Final    Comment:        The Xpert SA Assay (FDA approved for NASAL specimens in patients over 6 years of age), is one component of a comprehensive surveillance program.  Test performance has been validated by [facility] for patients greater than or equal to 74 year old. It is not intended to diagnose infection nor to guide or monitor treatment.   Aerobic/Anaerobic Culture (surgical/deep wound)     Status: Abnormal   Collection Time: 05/21/16  9:35 AM  Result Value Ref Range Status   Specimen Description WOUND LEFT THIGH  Final   Special Requests NONE  Final   Gram Stain   Final    RARE WBC PRESENT, PREDOMINANTLY PMN FEW GRAM VARIABLE ROD    Culture (A)  Final    MULTIPLE ORGANISMS PRESENT, NONE PREDOMINANT ABUNDANT BACTEROIDES OVATUS BETA LACTAMASE POSITIVE    Report Status 05/28/2016 FINAL  Final   Radiology Studies: No results found.  Scheduled Meds: . acetaminophen  650 mg Oral Q6H  . albuterol  3 mL Inhalation Q M,W,F-HD  . atorvastatin  20 mg Oral Daily  . darbepoetin (ARANESP) injection - DIALYSIS  150 mcg Intravenous Q Fri-HD  . feeding supplement (NEPRO CARB STEADY)  237  mL Oral TID BM  . feeding supplement (PRO-STAT SUGAR FREE 64)  30 mL Oral BID  . gi cocktail  30 mL Oral Once  . heparin  5,000 Units Subcutaneous Q8H  . insulin aspart  0-5 Units Subcutaneous QHS  . insulin aspart  0-9 Units Subcutaneous TID WC  . insulin aspart  5 Units Subcutaneous TID WC  . insulin glargine  14 Units Subcutaneous QHS  . mouth rinse  15 mL Mouth Rinse BID  . multivitamin  1 tablet Oral QHS  . nystatin  5 mL Oral QID  . polyethylene glycol  17 g Oral BID  . senna-docusate  1 tablet Oral BID  . sevelamer carbonate  800 mg Oral TID WC   Continuous Infusions: . ampicillin-sulbactam (UNASYN) IV       LOS: 27 days   Time spent: 22 min  Standley Dakins, MD [facility] Pager [phone]  If 7PM-7AM, please contact night-coverage [email or web address] Password [email or web address] 05/28/2016, 3:11 PM

## 2016-05-28 NOTE — Progress Notes (Signed)
Nutrition Follow-up  DOCUMENTATION CODES:   Morbid obesity  INTERVENTION:  Continue Nepro Shake po TID, each supplement provides 425 kcal and 19 grams protein.  Provide 30 ml Prostat po TID, each supplement provides 100 kcal and 15 grams of protein.   Encourage adequate PO intake.   NUTRITION DIAGNOSIS:   Inadequate oral intake related to acute illness, poor appetite as evidenced by meal completion < 25%; ongoing  GOAL:   Patient will meet greater than or equal to 90% of their needs; progressing  MONITOR:   PO intake, Supplement acceptance, Labs, Skin  REASON FOR ASSESSMENT:   Consult Diet education  ASSESSMENT:   41 y.o. female with medical history significant of morbid obesity, chronic kidney disease stage IV-V, diabetes mellitus, hypertension, prior CVA, diastolic CHF, presents to the emergency room with chief complaint of weakness as well as fever. Patient found to have HCAP HD initiated 3/28.   Pt more lethargic today. Pt was more responsive 4/14. Per RN, pt more alert when husband is at bedside. Per RN, no PO today. Pt did take a Nepro Shake today. RD to continue with current orders and increase Prostat to TID to aid in nutrition. Labs and medications reviewed.   Diet Order:  Diet - low sodium heart healthy Diet renal with fluid restriction Fluid restriction: 1200 mL Fluid; Room service appropriate? Yes; Fluid consistency: Thin  Skin:  Wound (see comment) (Gangrenous left posterior leg ulcer)  Last BM:  4/16  Height:   Ht Readings from Last 1 Encounters:  05/07/16  (1.676 m)    Weight:   Wt Readings from Last 1 Encounters:  05/27/16 299 lb (135.6 kg)    Ideal Body Weight:  59 kg  BMI:  Body mass index is 48.26 kg/m.  Estimated Nutritional Needs:   Kcal:  2200-2400kcal/day   Protein:  115-130 grams  Fluid:  >2.2L/day   EDUCATION NEEDS:   Education needs no appropriate at this time  Roslyn Smiling, MS, RD, LDN Pager # [phone] After  hours/ weekend pager # [phone]

## 2016-05-28 NOTE — Progress Notes (Signed)
05/28/2016 6:42 PM  I met with patient's sister at bedside for family update.  Pt was sitting up in chair, eating, vocalizing at times and interactive at times.    Murvin Natal, MD

## 2016-05-28 NOTE — Progress Notes (Addendum)
Notified MD of temp 100.5 and that pt is still drowsy and not following commands.   Unable to administer AM meds due to drowsiness and pt not able to give name and DOB when asked.   Pt will arouse to stimulation, eyes open but does not maintain attention. Pt falls back asleep.   Will notify charge RN and continue to monitor.  1115 Pt was abel to provide name,DOB and where she was at after sternal rub.   Pt opened eyes and after much encouragement was able to take nystatin medication but refused other meds.    1400 Pt is now able to keep eyes open and expresses she is thirsty and hungry. Pt given nepro and crackers. Pt is now taking medications and answering questions. MD is aware.    Leonia Reeves, RN

## 2016-05-28 NOTE — Progress Notes (Signed)
  [facility] Progress Note   Assessment/ Plan:   1. Acute encephalopathy- ? Component of delirium, depression, etc.  Was more responsive 4/14 according to notes  CT head negative 4/13.  Primary team getting psych eval 2. ESRD on HD MWF, providing HD on schedule.  Will try to get pt in a chair--> this is necessary for op dialysis 3. 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 Nutrition: albumin 1.9 6. Hypertension:not on any meds at this time 7.  Thigh wounds- s/p debridement, wound vac in place, and on antibiotics (Zosyn)   Subjective:    No acute events.  Needs to come to HD in a chair if possible   Objective:   BP 132/62 (BP Location: Right Arm)   Pulse (!) 110   Temp 98.5 F (36.9 C) (Axillary)   Resp 16   Ht  (1.676 m)   Wt 135.6 kg (299 lb)   LMP 04/11/2016   SpO2 100%   BMI 48.26 kg/m   Physical Exam: Gen: lying in bed. Will respond to noxious stimuli (especially light palpation of L leg wound) CVS:RRR Resp: nonlabored ZOX:WRUEA Ext:L medial thigh wound + woundvac, very tender  Labs: BMET  Recent Labs Lab 05/23/16 0503 05/24/16 0452 05/24/16 0730 05/25/16 0405 05/26/16 0517 05/27/16 0453 05/28/16 0449  NA 132* 134* 134* 135 138 139 138  K 3.7 4.2 3.9 3.4* 3.5 3.9 3.5  CL 95* 94* 94* 93* 96* 95* 96*  CO2 GLUCOSE 153* 195* 203* 178* 211* 182* 178*  BUN 29* 49* 50* 26* 46* 61* 27*  CREATININE 3.02* 4.38* 4.47* 3.20* 4.60* 5.54* 3.37*  CALCIUM 8.7* 9.0 9.0 9.0 9.1 9.5 9.2  PHOS 4.1 5.6* 5.5* 4.1 4.5 4.9* 2.9   CBC  Recent Labs Lab 05/26/16 0517 05/27/16 0453 05/27/16 0659 05/28/16 0449  WBC 15.2* 15.9* 15.7* 15.1*  HGB 8.6* 8.7* 8.8* 9.3*  HCT 28.6* 28.5* 29.1* 30.8*  MCV 89.7 88.8 89.3 89.8  PLT 363 347 322 328    @ Medications:    . acetaminophen  650 mg Oral Q6H  . albuterol  3 mL Inhalation Q M,W,F-HD  . atorvastatin  20 mg Oral Daily  . darbepoetin (ARANESP) injection - DIALYSIS   150 mcg Intravenous Q Fri-HD  . feeding supplement (NEPRO CARB STEADY)  237 mL Oral TID BM  . feeding supplement (PRO-STAT SUGAR FREE 64)  30 mL Oral BID  . gi cocktail  30 mL Oral Once  . heparin  5,000 Units Subcutaneous Q8H  . insulin aspart  0-5 Units Subcutaneous QHS  . insulin aspart  0-9 Units Subcutaneous TID WC  . insulin aspart  5 Units Subcutaneous TID WC  . insulin glargine  14 Units Subcutaneous QHS  . mouth rinse  15 mL Mouth Rinse BID  . multivitamin  1 tablet Oral QHS  . nystatin  5 mL Oral QID  . polyethylene glycol  17 g Oral BID  . senna-docusate  1 tablet Oral BID  . sevelamer carbonate  800 mg Oral TID WC     Bufford Buttner, MD 05/28/2016, 3:31 PM

## 2016-05-28 NOTE — Progress Notes (Signed)
Physical Therapy Treatment Patient Details Name: [name] MRN: [id number] DOB: 1975/02/19 Today's Date: 05/28/2016    History of Present Illness 41 y.o. female with medical history significant of morbid obesity, chronic kidney disease stage IV-V, diabetes mellitus, hypertension, prior CVA, diastolic CHF, presents to the emergency room with chief complaint of weakness as well as fever and diagnosed with acute hypoxic resp failure with acute encephalopathy. S/p L AV graft on 05/14/16.     PT Comments    [name] presents today with decr ability to participate, slow moving, decr arousal; she is very different than the last time I worked with her in March; Noted primary team is considering Psych evaluation -- I agree it is worth considering workup for depression   Follow Up Recommendations  SNF;Supervision/Assistance - 24 hour     Equipment Recommendations  Hospital bed;Wheelchair (measurements PT);Wheelchair cushion (measurements PT);Other (comment)    Recommendations for Other Services       Precautions / Restrictions Precautions Precautions: Fall Precaution Comments: bil inner thigh wounds that were surgically debrided in OR    Mobility  Bed Mobility Overal bed mobility: Needs Assistance Bed Mobility: Rolling Rolling: Max assist;+2 for physical assistance         General bed mobility comments: Rolled to place maxi pad for t/f. Pt required Max-Total A and Max VCs for hand placement and encourgment  Transfers Overall transfer level: Needs assistance               General transfer comment: Required 3+ to transition with maxi sky  Ambulation/Gait                 Stairs            Wheelchair Mobility    Modified Rankin (Stroke Patients Only)       Balance Overall balance assessment: Needs assistance Sitting-balance support: Bilateral upper extremity supported;Feet supported Sitting balance-Leahy Scale: Zero Sitting balance -  Comments: Pt required pillows to maintain sitting posture in recliner; worked on pulling from supported to unsupported sitting; noted some effort on pt's part, minimal pulling with bil UEs (R more than L) Postural control: Right lateral lean                                  Cognition Arousal/Alertness: Lethargic Behavior During Therapy: Flat affect Overall Cognitive Status: Difficult to assess                                 General Comments: Pt presented with little verbalization with difficulty understanding speech production.       Exercises      General Comments General comments (skin integrity, edema, etc.): Pt with low participation. Communicated with RN on possible depression      Pertinent Vitals/Pain Pain Assessment: Faces Faces Pain Scale: Hurts a little bit Pain Location: BLE Pain Descriptors / Indicators: Moaning;Sore Pain Intervention(s): Limited activity within patient's tolerance    Home Living                      Prior Function            PT Goals (current goals can now be found in the care plan section) Acute Rehab PT Goals Patient Stated Goal: did not state PT Goal Formulation: With patient Time For Goal Achievement: 06/05/16 Potential to Achieve  Goals: Fair Progress towards PT goals: Not progressing toward goals - comment (low participation)    Frequency    Min 3X/week      PT Plan Current plan remains appropriate    Co-evaluation PT/OT/SLP Co-Evaluation/Treatment: Yes Reason for Co-Treatment: Complexity of the patient's impairments (multi-system involvement);For patient/therapist safety PT goals addressed during session: Mobility/safety with mobility OT goals addressed during session: ADL's and self-care     End of Session   Activity Tolerance: Patient limited by lethargy Patient left: in chair;with call bell/phone within reach;with chair alarm set Nurse Communication: Mobility status;Need for lift  equipment PT Visit Diagnosis: Muscle weakness (generalized) (M62.81);Adult, failure to thrive (R62.7) Pain - Right/Left: Left Pain - part of body: Leg     Time: 7829-5621 PT Time Calculation (min) (ACUTE ONLY): 32 min  Charges:  $Therapeutic Activity: 23-37 mins                    G Codes:       Van Clines, PT  Acute Rehabilitation Services Pager [phone] Office [phone]    Levi Aland 05/28/2016, 4:31 PM

## 2016-05-28 NOTE — Progress Notes (Signed)
Occupational Therapy Treatment Patient Details Name: [name] MRN: [id number] DOB: 01/28/1976 Today's Date: 05/28/2016    History of present illness 41 y.o. female with medical history significant of morbid obesity, chronic kidney disease stage IV-V, diabetes mellitus, hypertension, prior CVA, diastolic CHF, presents to the emergency room with chief complaint of weakness as well as fever and diagnosed with acute hypoxic resp failure with acute encephalopathy. S/p L AV graft on 05/14/16.    OT comments  Pt presents with limited participation and difficulty performing functional tasks. Pt was limited with UE movement and required Max A to perform grooming and self feeding. Pt agreed to transfer OOB. Transfer to recliner with MaxiSky and MaxA+3. Pt would benefit from continued OT to increase occupational performance and participation. Will continue to follow acutely.    Follow Up Recommendations  SNF    Equipment Recommendations  None recommended by OT    Recommendations for Other Services      Precautions / Restrictions Precautions Precautions: Fall Precaution Comments: bil inner thigh wounds that were surgically debrided in OR       Mobility Bed Mobility Overal bed mobility: Needs Assistance Bed Mobility: Rolling Rolling: Max assist;+2 for physical assistance         General bed mobility comments: Rolled to place maxi pad for t/f. Pt required Max-Total A and Max VCs for hand placement and encourgment  Transfers Overall transfer level: Needs assistance               General transfer comment: Required 3+ to transition with maxi sky    Balance Overall balance assessment: Needs assistance Sitting-balance support: Bilateral upper extremity supported;Feet supported Sitting balance-Leahy Scale: Zero Sitting balance - Comments: Pt required pillows to maintain sitting posture in recliner Postural control: Right lateral lean                                  ADL either performed or assessed with clinical judgement   ADL Overall ADL's : Needs assistance/impaired Eating/Feeding: Maximal assistance;Sitting Eating/Feeding Details (indicate cue type and reason): Pt asked for "milkshake and ice." To bring straw to mouth, pt required Max A at the elbow and wrist Grooming: Oral care;Maximal assistance;Sitting Grooming Details (indicate cue type and reason): Pt required Max A to maintain grasp of tooth brush. Required facilitate and physical A at elbow to bring hand towards mouth. Pt also attempted to apply hand lotion and required Max A to bring hands to midline                             Functional mobility during ADLs: Total assistance (Maxi Sky) General ADL Comments: pt with limited participation. Will need Max- total A with ADL activity .     Vision       Perception     Praxis      Cognition Arousal/Alertness: Lethargic Behavior During Therapy: Flat affect Overall Cognitive Status: Difficult to assess                                 General Comments: Pt presented with little verbalization with difficulty understanding speech production.         Exercises     Shoulder Instructions       General Comments Pt with low participation. Communicated with RN on possible depression  Pertinent Vitals/ Pain       Pain Assessment: Faces Faces Pain Scale: Hurts a little bit Pain Location: BLE Pain Descriptors / Indicators: Moaning;Sore Pain Intervention(s): Limited activity within patient's tolerance  Home Living                                          Prior Functioning/Environment              Frequency  Min 2X/week        Progress Toward Goals  OT Goals(current goals can now be found in the care plan section)  Progress towards OT goals: Not progressing toward goals - comment (Pt presenting with continued lethargia)  Acute Rehab OT Goals Patient Stated Goal: did  not state during OT eval OT Goal Formulation: With patient Time For Goal Achievement: 06/01/16 Potential to Achieve Goals: Fair ADL Goals Pt Will Perform Eating: with set-up;sitting Pt Will Perform Grooming: with set-up;sitting Pt/caregiver will Perform Home Exercise Program: Both right and left upper extremity;Increased ROM;Increased strength;With written HEP provided  Plan Discharge plan remains appropriate    Co-evaluation    PT/OT/SLP Co-Evaluation/Treatment: Yes Reason for Co-Treatment: Complexity of the patient's impairments (multi-system involvement);For patient/therapist safety PT goals addressed during session: Mobility/safety with mobility OT goals addressed during session: ADL's and self-care      End of Session    OT Visit Diagnosis: Muscle weakness (generalized) (M62.81)   Activity Tolerance Patient limited by lethargy   Patient Left in chair;with call bell/phone within reach   Nurse Communication Mobility status;Need for lift equipment        Time: 1610-9604 OT Time Calculation (min): 32 min  Charges: OT General Charges $OT Visit: 1 Procedure OT Treatments $Self Care/Home Management : 8-22 mins  [name], OTR/L [phone]   Theodoro Grist [name] 05/28/2016, 3:39 PM

## 2016-05-29 DIAGNOSIS — G9340 Encephalopathy, unspecified: Secondary | ICD-10-CM

## 2016-05-29 LAB — CBC
HCT: 27.8 % — ABNORMAL LOW (ref 36.0–46.0)
Hemoglobin: 8.3 g/dL — ABNORMAL LOW (ref 12.0–15.0)
MCH: 26.9 pg (ref 26.0–34.0)
MCHC: 29.9 g/dL — ABNORMAL LOW (ref 30.0–36.0)
MCV: 90 fL (ref 78.0–100.0)
PLATELETS: 340 10*3/uL (ref 150–400)
RBC: 3.09 MIL/uL — AB (ref 3.87–5.11)
RDW: 16.3 % — AB (ref 11.5–15.5)
WBC: 17.4 10*3/uL — AB (ref 4.0–10.5)

## 2016-05-29 LAB — GLUCOSE, CAPILLARY
GLUCOSE-CAPILLARY: 162 mg/dL — AB (ref 65–99)
GLUCOSE-CAPILLARY: 194 mg/dL — AB (ref 65–99)
Glucose-Capillary: 202 mg/dL — ABNORMAL HIGH (ref 65–99)

## 2016-05-29 LAB — RENAL FUNCTION PANEL
ALBUMIN: 1.8 g/dL — AB (ref 3.5–5.0)
Anion gap: 12 (ref 5–15)
BUN: 43 mg/dL — AB (ref 6–20)
CHLORIDE: 95 mmol/L — AB (ref 101–111)
CO2: 26 mmol/L (ref 22–32)
CREATININE: 4.37 mg/dL — AB (ref 0.44–1.00)
Calcium: 9.1 mg/dL (ref 8.9–10.3)
GFR, EST AFRICAN AMERICAN: 14 mL/min — AB (ref >60)
GFR, EST NON AFRICAN AMERICAN: 12 mL/min — AB (ref >60)
Glucose, Bld: 254 mg/dL — ABNORMAL HIGH (ref 65–99)
Phosphorus: 3.5 mg/dL (ref 2.5–4.6)
Potassium: 3.2 mmol/L — ABNORMAL LOW (ref 3.5–5.1)
SODIUM: 133 mmol/L — AB (ref 135–145)

## 2016-05-29 NOTE — Progress Notes (Signed)
  [facility] Progress Note   Assessment/ Plan:   1. Acute encephalopathy- ? Component of delirium, depression, etc.  Was more responsive 4/14 according to notes  CT head negative 4/13.  Primary team considering psych eval- this is a good idea 2. ESRD on HD MWF, providing HD on schedule.  Pt needs to come in a chair to HD.  4K bath 3. 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 Nutrition: albumin 1.9 6. Hypertension:not on any meds at this time 7.  Thigh wounds- s/p debridement, wound vac in place, and on antibiotics (Zosyn--> Unasyn)   Subjective:    More responsive this AM.  Still in the bed.   Objective:   BP 121/64 (BP Location: Right Wrist)   Pulse (!) 107   Temp 97.7 F (36.5 C) (Oral)   Resp 18   Ht  (1.676 m)   Wt 135 kg (297 lb 9.9 oz)   LMP 04/11/2016   SpO2 100%   BMI 48.04 kg/m   Physical Exam: Gen: lying in bed. Will respond to noxious stimuli (especially light palpation of L leg wound) CVS:RRR Resp: nonlabored WUJ:WJXBJ Ext:L medial thigh wound + woundvac, very tender  Labs: BMET  Recent Labs Lab 05/24/16 0452 05/24/16 0730 05/25/16 0405 05/26/16 0517 05/27/16 0453 05/28/16 0449 05/29/16 0454  NA 134* 134* 135 138 139 138 133*  K 4.2 3.9 3.4* 3.5 3.9 3.5 3.2*  CL 94* 94* 93* 96* 95* 96* 95*  CO2 GLUCOSE 195* 203* 178* 211* 182* 178* 254*  BUN 49* 50* 26* 46* 61* 27* 43*  CREATININE 4.38* 4.47* 3.20* 4.60* 5.54* 3.37* 4.37*  CALCIUM 9.0 9.0 9.0 9.1 9.5 9.2 9.1  PHOS 5.6* 5.5* 4.1 4.5 4.9* 2.9 3.5   CBC  Recent Labs Lab 05/27/16 0453 05/27/16 0659 05/28/16 0449 05/29/16 0454  WBC 15.9* 15.7* 15.1* 17.4*  HGB 8.7* 8.8* 9.3* 8.3*  HCT 28.5* 29.1* 30.8* 27.8*  MCV 88.8 89.3 89.8 90.0  PLT 347 322 328 340    @ Medications:    . acetaminophen  650 mg Oral Q6H  . albuterol  3 mL Inhalation Q M,W,F-HD  . atorvastatin  20 mg Oral Daily  . darbepoetin (ARANESP) injection - DIALYSIS   150 mcg Intravenous Q Fri-HD  . feeding supplement (NEPRO CARB STEADY)  237 mL Oral TID BM  . feeding supplement (PRO-STAT SUGAR FREE 64)  30 mL Oral TID  . gi cocktail  30 mL Oral Once  . heparin  5,000 Units Subcutaneous Q8H  . insulin aspart  0-5 Units Subcutaneous QHS  . insulin aspart  0-9 Units Subcutaneous TID WC  . insulin aspart  5 Units Subcutaneous TID WC  . insulin glargine  14 Units Subcutaneous QHS  . mouth rinse  15 mL Mouth Rinse BID  . multivitamin  1 tablet Oral QHS  . nystatin  5 mL Oral QID  . polyethylene glycol  17 g Oral BID  . senna-docusate  1 tablet Oral BID  . sevelamer carbonate  800 mg Oral TID WC     Bufford Buttner, MD 05/29/2016, 8:41 AM

## 2016-05-29 NOTE — Progress Notes (Signed)
[facility]  PROGRESS NOTE  [name] ZOX:[id number] DOB: 11-18-1975 DOA: 05/01/2016 PCP: Marletta Lor, NP   Brief HPI:   41 y.o.femalewith medical history significant formorbid obesity, chronic kidney disease stage IV-V, diabetes mellitus, hypertension, prior CVA, diastolic CHF who presented to ED with progressive weakness, fever for past 1 week prior to this admission. She is actually bed bound for past year or so per her husband report ever since stroke. She also had dry cough and shortness of breath for past 1 week.  Her creatinine was 7.9 (much worse since her recent Cr values of 4-5).  She was started on hemodialysis and underwent left upper arm AV graft placement on 4/3.  She was anticipating going home with her family but because she is too weak to assist with transfers and is morbidly obese, she would need a hoyer lift, hospital bed, and wheelchair for transport to dialysis.  Unfortunately, she was given a hospital bed last year which was "lost" and no home health agencies in the region will provide her with equipment.  She was told that she will need to go to SNF for short term rehabilitation to regain enough strength to at least transfer from a low bed/mattress to a chair in order to get to HD.  She has been having intermittent fevers without defined source.  Her fevers appeared to get better with cessation of antibiotics, however, they returned.  This may be due to a gangrenous area on the posterior left thigh which was debrided by general surgery on 4/10 in the OR.     Subjective   Patient appears withdrawn, denies any complaints.   Assessment/Plan:     1. ESRD with acute pulmonary edema- Patient was initially placed on aggressive IV Lasix and metolazone and progressed to ESRD. Tunnel dialysis catheter was placed on 05/07/2016. Hematemesis was initiated 05/08/2016. Patient accepted at [facility] [address]. AV graft placement done on 05/14/2016.  Vascular surgery following. 2. Gangrenous left posterior leg ulcer- patient had "is negative staph from 1 out of 2 bottles but cultures on 04/03/2016 which was felt to be contaminant. Appreciate wound care assistance. General surgery was consulted and patient went to OR with debridement on 05/21/2016 under general anesthesia. Resumed vancomycin and Zosyn. Wound VAC ordered by surgery on 05/24/2016. Antibiotics Descalated to Unasyn on 05/27/2016. 3. Morbid obesity/generalized weakness- patient is unable to assist with transfers. No home health agency to provide equipment and services to home because of lost equipment in the past. Social worker consulted for SNF placement. Rate has markedly gone down since admission. Nutrition consulted. 4. Acute respiratory failure with hypoxia- resolved 5. Acute metabolic encephalopathy-patient's alertness has been fixing and waning. Likely multifactorial from history of CVA, end-stage renal disease, possible uremia, infectious process. There is a question of possible depression, will consult psych for further recommendations. 6. Chest pain-patient had episode of chest pain which resolved, troponin was 0.04 hour telemetry was 2.01. VQ scan was negative for PE 7. Anemia of chronic disease- status post 2 units PRBC transfusion on 05/02/2016, Aranesp 05/02/2016 continue iron supplementation. 8. History of oral thrush-patient was given fluconazole 1, will start insertion swallow 4 times a day started on 05/27/2016. 9. Diabetes mellitus-continue Lantus, increase to 40 minutes. Started 5 units NovoLog 3 times a day with meals. Low-dose sliding scale insulin NovoLog. 10. Bilateral inner thigh wounds, left leg wounds- Gen. surgery was consulted underwent debridement on 05/21/2016, wound VAC is in place. Antibiotics as ordered. 11. Hypertension- blood pressure  is controlled, holding home BP medications 12. 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    DVT prophylaxis: Heparin  Code Status: Full code  Family Communication: No family present at bedside   Disposition Plan: Skilled nursing facility, once patient able to sit in chair for dialysis treatment   Consultants:  Nephrology  Palliative care  Nutrition  Vascular surgery  Procedures:  None   Continuous infusions . ampicillin-sulbactam (UNASYN) IV        Antibiotics:   Anti-infectives    Start     Dose/Rate Route Frequency Ordered Stop   05/29/16 1200  vancomycin (VANCOCIN) 500 mg in sodium chloride 0.9 % 100 mL IVPB  Status:  Discontinued     500 mg 100 mL/hr over 60 Minutes Intravenous Every M-W-F (Hemodialysis) 05/25/16 1126 05/27/16 0855   05/27/16 2000  Ampicillin-Sulbactam (UNASYN) 3 g in sodium chloride 0.9 % 100 mL IVPB     3 g 200 mL/hr over 30 Minutes Intravenous Every 24 hours 05/27/16 1256     05/27/16 1530  fluconazole (DIFLUCAN) tablet 100 mg     100 mg Oral  Once 05/27/16 1530 05/27/16 1714   05/27/16 1122  vancomycin (VANCOCIN) 500-5 MG/100ML-% IVPB  Status:  Discontinued    Comments:  Herriott, [name]   : cabinet override      05/27/16 1122 05/27/16 1126   05/22/16 1200  vancomycin (VANCOCIN) 1,250 mg in sodium chloride 0.9 % 250 mL IVPB  Status:  Discontinued     1,250 mg 250 mL/hr over 60 Minutes Intravenous Every M-W-F (Hemodialysis) 05/20/16 1024 05/20/16 1027   05/22/16 1200  vancomycin (VANCOCIN) IVPB 1000 mg/200 mL premix  Status:  Discontinued     1,000 mg 200 mL/hr over 60 Minutes Intravenous Every M-W-F (Hemodialysis) 05/20/16 1027 05/25/16 1126   05/20/16 1200  vancomycin (VANCOCIN) 2,250 mg in sodium chloride 0.9 % 250 mL IVPB     2,250 mg 250 mL/hr over 60 Minutes Intravenous Every Mon (Hemodialysis) 05/20/16 1024 05/20/16 1513   05/20/16 1030  piperacillin-tazobactam (ZOSYN) IVPB 3.375 g  Status:  Discontinued     3.375 g 12.5 mL/hr over 240 Minutes Intravenous Every 12 hours 05/20/16 1012  05/27/16 1219   05/16/16 1630  cefTRIAXone (ROCEPHIN) 1 g in dextrose 5 % 50 mL IVPB  Status:  Discontinued     1 g 100 mL/hr over 30 Minutes Intravenous Every 24 hours 05/16/16 1625 05/17/16 1641   05/14/16 1430  cefTAZidime (FORTAZ) 1 g in dextrose 5 % 50 mL IVPB  Status:  Discontinued     1 g 100 mL/hr over 30 Minutes Intravenous Every 24 hours 05/14/16 1406 05/15/16 1325   05/14/16 1430  doxycycline (VIBRA-TABS) tablet 100 mg  Status:  Discontinued     100 mg Oral Every 12 hours 05/14/16 1406 05/15/16 1325   05/14/16 0000  ceFAZolin (ANCEF) IVPB 1 g/50 mL premix  Status:  Discontinued    Comments:  Send with pt to OR   1 g 100 mL/hr over 30 Minutes Intravenous On call 05/13/16 1630 05/15/16 1325   05/12/16 1330  cefTAZidime (FORTAZ) 1 g in dextrose 5 % 50 mL IVPB  Status:  Discontinued     1 g 100 mL/hr over 30 Minutes Intravenous Every 24 hours 05/12/16 1319 05/14/16 1406   05/08/16 1000  vancomycin (VANCOCIN) IVPB 1000 mg/200 mL premix     1,000 mg 200 mL/hr over 60 Minutes Intravenous To Surgery  05/07/16 1904 05/08/16 1004   05/08/16 0902  vancomycin (VANCOCIN) 1-5 GM/200ML-% IVPB    Comments:  Matilde Haymaker, [name]   : cabinet override      05/08/16 0902 05/08/16 0904   05/07/16 1300  ceFAZolin (ANCEF) 3 g in dextrose 5 % 50 mL IVPB     3 g 130 mL/hr over 30 Minutes Intravenous To Radiology 05/07/16 1232 05/07/16 1330   05/02/16 2200  doxycycline (VIBRA-TABS) tablet 100 mg  Status:  Discontinued     100 mg Oral Every 12 hours 05/02/16 1732 05/14/16 1406   05/01/16 2200  piperacillin-tazobactam (ZOSYN) IVPB 2.25 g  Status:  Discontinued     2.25 g 100 mL/hr over 30 Minutes Intravenous Every 8 hours 05/01/16 1324 05/02/16 1729   05/01/16 1330  vancomycin (VANCOCIN) 2,500 mg in sodium chloride 0.9 % 500 mL IVPB     2,500 mg 250 mL/hr over 120 Minutes Intravenous  Once 05/01/16 1309 05/01/16 1551   05/01/16 1300  piperacillin-tazobactam (ZOSYN) IVPB 3.375 g     3.375 g 100 mL/hr  over 30 Minutes Intravenous  Once 05/01/16 1258 05/01/16 1340   05/01/16 1300  vancomycin (VANCOCIN) IVPB 1000 mg/200 mL premix  Status:  Discontinued     1,000 mg 200 mL/hr over 60 Minutes Intravenous  Once 05/01/16 1258 05/01/16 1308       Objective   Vitals:   05/29/16 1100 05/29/16 1124 05/29/16 1219 05/29/16 1646  BP: 103/68 (!) 149/73 115/64 120/66  Pulse: (!) 106 (!) 103 (!) 107 (!) 101  Resp:  Temp:  98.1 F (36.7 C) 98.9 F (37.2 C) 98.5 F (36.9 C)  TempSrc:  Oral Oral Oral  SpO2:  99% 100% 100%  Weight:  135 kg (297 lb 9.9 oz)    Height:        Intake/Output Summary (Last 24 hours) at 05/29/16 1828 Last data filed at 05/29/16 1300  Gross per 24 hour  Intake              220 ml  Output               88 ml  Net              132 ml   Filed Weights   05/28/16 2320 05/29/16 0700 05/29/16 1124  Weight: (!) 140.2 kg (309 lb) 135 kg (297 lb 9.9 oz) 135 kg (297 lb 9.9 oz)     Physical Examination:  General exam: Appears calm and comfortable. Respiratory system: Clear to auscultation. Respiratory effort normal. Cardiovascular system:  RRR. No  murmurs, rubs, gallops. No pedal edema. GI system: Abdomen is nondistended, soft and nontender. No organomegaly.  Central nervous system. No focal neurological deficits. 5 x 5 power in all extremities. Skin: No rashes, lesions or ulcers. Psychiatry: Alert, oriented x 3.Judgement and insight appear normal. Affect normal.    Data Reviewed: I have personally reviewed following labs and imaging studies  CBG:  Recent Labs Lab 05/28/16 1154 05/28/16 1701 05/28/16 2150 05/29/16 1217 05/29/16 1644  GLUCAP 176* 300* 378* 162* 194*    CBC:  Recent Labs Lab 05/26/16 0517 05/27/16 0453 05/27/16 0659 05/28/16 0449 05/29/16 0454  WBC 15.2* 15.9* 15.7* 15.1* 17.4*  HGB 8.6* 8.7* 8.8* 9.3* 8.3*  HCT 28.6* 28.5* 29.1* 30.8* 27.8*  MCV 89.7 88.8 89.3 89.8 90.0  PLT 363 347 322 328 340    Basic Metabolic  Panel:  Recent Labs Lab 05/25/16 0405 05/26/16 0517  05/27/16 0453 05/28/16 0449 05/29/16 0454  NA 135 138 139 138 133*  K 3.4* 3.5 3.9 3.5 3.2*  CL 93* 96* 95* 96* 95*  CO2 GLUCOSE 178* 211* 182* 178* 254*  BUN 26* 46* 61* 27* 43*  CREATININE 3.20* 4.60* 5.54* 3.37* 4.37*  CALCIUM 9.0 9.1 9.5 9.2 9.1  PHOS 4.1 4.5 4.9* 2.9 3.5    Recent Results (from the past 240 hour(s))  Surgical pcr screen     Status: None   Collection Time: 05/21/16  1:27 AM  Result Value Ref Range Status   MRSA, PCR NEGATIVE NEGATIVE Final   Staphylococcus aureus NEGATIVE NEGATIVE Final    Comment:        The Xpert SA Assay (FDA approved for NASAL specimens in patients over 72 years of age), is one component of a comprehensive surveillance program.  Test performance has been validated by [facility] for patients greater than or equal to 40 year old. It is not intended to diagnose infection nor to guide or monitor treatment.   Aerobic/Anaerobic Culture (surgical/deep wound)     Status: Abnormal   Collection Time: 05/21/16  9:35 AM  Result Value Ref Range Status   Specimen Description WOUND LEFT THIGH  Final   Special Requests NONE  Final   Gram Stain   Final    RARE WBC PRESENT, PREDOMINANTLY PMN FEW GRAM VARIABLE ROD    Culture (A)  Final    MULTIPLE ORGANISMS PRESENT, NONE PREDOMINANT ABUNDANT BACTEROIDES OVATUS BETA LACTAMASE POSITIVE    Report Status 05/28/2016 FINAL  Final     Liver Function Tests:  Recent Labs Lab 05/25/16 0405 05/26/16 0517 05/27/16 0453 05/28/16 0449 05/29/16 0454  ALBUMIN 1.8* 1.8* 1.9* 1.9* 1.8*   No results for input(s): LIPASE, AMYLASE in the last 168 hours. No results for input(s): AMMONIA in the last 168 hours.  Cardiac Enzymes: No results for input(s): CKTOTAL, CKMB, CKMBINDEX, TROPONINI in the last 168 hours. BNP (last 3 results)  Recent Labs  03/22/16 0603 03/24/16 0332 05/01/16 1218  BNP 646.0* 344.9* 207.5*     ProBNP (last 3 results) No results for input(s): PROBNP in the last 8760 hours.    Studies: No results found.  Scheduled Meds: . acetaminophen  650 mg Oral Q6H  . albuterol  3 mL Inhalation Q M,W,F-HD  . atorvastatin  20 mg Oral Daily  . darbepoetin (ARANESP) injection - DIALYSIS  150 mcg Intravenous Q Fri-HD  . feeding supplement (NEPRO CARB STEADY)  237 mL Oral TID BM  . feeding supplement (PRO-STAT SUGAR FREE 64)  30 mL Oral TID  . heparin  5,000 Units Subcutaneous Q8H  . insulin aspart  0-5 Units Subcutaneous QHS  . insulin aspart  0-9 Units Subcutaneous TID WC  . insulin aspart  5 Units Subcutaneous TID WC  . insulin glargine  14 Units Subcutaneous QHS  . mouth rinse  15 mL Mouth Rinse BID  . multivitamin  1 tablet Oral QHS  . nystatin  5 mL Oral QID  . polyethylene glycol  17 g Oral BID  . senna-docusate  1 tablet Oral BID  . sevelamer carbonate  800 mg Oral TID WC      Time spent: 25 min  [facility]   [facility] Pager [phone]. If 7PM-7AM, please contact night-coverage at [email or web address], Office  [phone]  password [email or web address] 05/29/2016, 6:28 PM  LOS: 28 days

## 2016-05-29 NOTE — Consult Note (Signed)
WOC Nurse wound follow up Wound type: surgical s/p debridement Measurement: 16cm x 6cm x 3.5cm  Wound bed:bed is dry and darker today, tissue seems to becoming necrotic, wound does not appear to be healing or healthy.  Contacted [facility] they will re-assess wound on Friday, may need further debridement or hydrotherapy for a short while Drainage (amount, consistency, odor) serosanguinous  Periwound: intact, with brawny skin changes and hardness from dependent edema Dressing procedure/placement/frequency: Window framed wound with hydrocolloid to aid in obtaining a seal. 1pc of black foam used to fill the wound bed. Sealed at .  Patient continues to be very drowsy, even more today than Monday, does not even moan with dressing change today. She can not stay awake. She was not eating even with tray in front of her and she is a little confused today.  Medical team aware per bedside nurses.   WOC nurse team will continue to change NPWT VAC dressing M/W/F, will plan to have [facility] at the bedside for assessment with next [facility] change Friday April 20th.   [facility], CNS [phone]

## 2016-05-29 NOTE — Procedures (Signed)
Patient seen and examined on Hemodialysis. QB 300 via R TDC, UF goal 1L Treatment adjusted as needed.  She is not eating well and as a result is on 4K bath now  Bufford Buttner MD [facility] 8:43 AM

## 2016-05-29 NOTE — Clinical Social Work Note (Addendum)
CSW talked with Velna Hatchet in dialysis center and confirmed that patient set-up at [facility] dialysis center MWF - 2nd shift. Her chair time is 1 pm. Call made to HD Center by Velna Hatchet and Center will hoyer list patient from wheelchair to dialysis chair.  Call made to [facility], admissions director at [facility] (accepting facility) regarding patient, her level of mobility and participation with therapists, and upcoming discharge. She will review patient's clinicals again. CSW will continue to follow and facilitate discharge to SNF when ready.  Genelle Bal, MSW, LCSW Licensed Clinical Social Worker Clinical Social Work Department [employer] [phone]

## 2016-05-29 NOTE — Progress Notes (Signed)
RN discussed with hemodialysis RN that patient needs to be up in the chair for hemodialysis today. HD RN states she is not aware of patient having to be up in chair. Patient was taken in the bed to HD. Oncoming RN notified.  Veatrice Kells, RN

## 2016-05-30 DIAGNOSIS — L8990 Pressure ulcer of unspecified site, unspecified stage: Secondary | ICD-10-CM

## 2016-05-30 DIAGNOSIS — F0631 Mood disorder due to known physiological condition with depressive features: Secondary | ICD-10-CM | POA: Diagnosis present

## 2016-05-30 DIAGNOSIS — R531 Weakness: Secondary | ICD-10-CM

## 2016-05-30 LAB — RENAL FUNCTION PANEL
Albumin: 1.8 g/dL — ABNORMAL LOW (ref 3.5–5.0)
Anion gap: 12 (ref 5–15)
BUN: 25 mg/dL — ABNORMAL HIGH (ref 6–20)
CO2: 26 mmol/L (ref 22–32)
Calcium: 9 mg/dL (ref 8.9–10.3)
Chloride: 97 mmol/L — ABNORMAL LOW (ref 101–111)
Creatinine, Ser: 2.65 mg/dL — ABNORMAL HIGH (ref 0.44–1.00)
GFR calc Af Amer: 25 mL/min — ABNORMAL LOW (ref >60)
GFR calc non Af Amer: 22 mL/min — ABNORMAL LOW (ref >60)
Glucose, Bld: 220 mg/dL — ABNORMAL HIGH (ref 65–99)
Phosphorus: 2.7 mg/dL (ref 2.5–4.6)
Potassium: 3.7 mmol/L (ref 3.5–5.1)
Sodium: 135 mmol/L (ref 135–145)

## 2016-05-30 LAB — CBC
HCT: 28.2 % — ABNORMAL LOW (ref 36.0–46.0)
Hemoglobin: 8.3 g/dL — ABNORMAL LOW (ref 12.0–15.0)
MCH: 26.5 pg (ref 26.0–34.0)
MCHC: 29.4 g/dL — ABNORMAL LOW (ref 30.0–36.0)
MCV: 90.1 fL (ref 78.0–100.0)
Platelets: 324 10*3/uL (ref 150–400)
RBC: 3.13 MIL/uL — ABNORMAL LOW (ref 3.87–5.11)
RDW: 16.3 % — ABNORMAL HIGH (ref 11.5–15.5)
WBC: 16.3 10*3/uL — ABNORMAL HIGH (ref 4.0–10.5)

## 2016-05-30 LAB — IRON AND TIBC: Iron: 32 ug/dL (ref 28–170)

## 2016-05-30 LAB — FERRITIN: Ferritin: 3194 ng/mL — ABNORMAL HIGH (ref 11–307)

## 2016-05-30 LAB — GLUCOSE, CAPILLARY
GLUCOSE-CAPILLARY: 237 mg/dL — AB (ref 65–99)
Glucose-Capillary: 196 mg/dL — ABNORMAL HIGH (ref 65–99)
Glucose-Capillary: 214 mg/dL — ABNORMAL HIGH (ref 65–99)
Glucose-Capillary: 252 mg/dL — ABNORMAL HIGH (ref 65–99)

## 2016-05-30 MED ORDER — DULOXETINE HCL 30 MG PO CPEP
30.0000 mg | ORAL_CAPSULE | Freq: Every day | ORAL | Status: DC
  Administered 2016-05-30 – 2016-05-31 (×2): 30 mg via ORAL
  Filled 2016-05-30 (×2): qty 1

## 2016-05-30 NOTE — Consult Note (Signed)
[facility] Face-to-Face Psychiatry Consult   Reason for Consult:  Depression and multiple medical problems Referring Physician:  Dr. Sharl Ma Patient Identification: [name] MRN:  [id number] Principal Diagnosis: Depression due to physical illness Diagnosis:   Patient Active Problem List   Diagnosis Date Noted  . Pressure injury of skin [L89.90] 05/27/2016  . ESRD (end stage renal disease) (HCC) [N18.6]   . Goals of care, counseling/discussion [Z71.89]   . Palliative care by specialist [Z51.5]   . Acute metabolic encephalopathy [G93.41] 05/04/2016  . Acute renal failure superimposed on stage 5 chronic kidney disease, not on chronic dialysis (HCC) [N17.9, N18.5] 05/04/2016  . Acute respiratory failure with hypoxia (HCC) [J96.01] 05/04/2016  . Right middle lobe pneumonia (HCC) [J18.1] 05/04/2016  . Leukocytosis [D72.829] 05/04/2016  . Uncontrolled diabetes mellitus with diabetic nephropathy, with long-term current use of insulin (HCC) [E11.21, Z79.4, E11.65] 05/04/2016  . Dyslipidemia associated with type 2 diabetes mellitus (HCC) [E11.69, E78.5] 05/04/2016  . Acute pulmonary edema (HCC) [J81.0]   . History of CVA with residual deficit [I69.30]   . Anemia of chronic disease [D63.8]   . Lymphedema [I89.0]   . Debility [R53.81]   . Essential hypertension [I10]   . Morbid obesity (HCC) [E66.01] 02/05/2015    Total Time spent with patient: 1 hour  Subjective:   [name] is a 41 y.o. female patient admitted with generalized weakness and fever.  HPI:  [name] is a 41 y.o. female with medical history significant of morbid obesity, chronic kidney disease stage IV-V, diabetes mellitus, hypertension, prior CVA, And diastolic CHF. Patient is seen, chart reviewed for this face-to-face psychiatric consultation and evaluation of depression secondary to multiple chronic medical problems. Patient appeared sitting in a chair next to her bed and drinking her protein  drink. Patient endorses feeling sad, depressed, decreased psychomotor activity, lack of energy and interest in usual activities. Patient denied suicidal ideation, homicidal ideation, intention or plans. Patient has no evidence of auditory/visual hallucinations, delusions or paranoia. Patient reportedly bedridden secondary to multiple medical problems including end-stage renal disease, right middle lobe pneumonia and diastolic congestive heart failure and history of CVA. Patient stated she has been missing her mother, husband and she also reported has 3 children who are for 55 and 68 years old. Patient is willing to give a trial of antidepressant medication and also reported now deceased any psychiatric medication management previously.  Past Psychiatric History: None reported.  Risk to Self: Is patient at risk for suicide?: No Risk to Others:   Prior Inpatient Therapy:   Prior Outpatient Therapy:    Past Medical History:  Past Medical History:  Diagnosis Date  . Anasarca 02/05/2015  . Asthma   . CHF (congestive heart failure) (HCC)    diastolic  . CVA (cerebral infarction) 1990's   "writing is not the same since" R leg weakness   . GERD (gastroesophageal reflux disease)   . Hypertension   . Morbid obesity (HCC)   . Palliative care encounter   . Stroke (HCC)   . Type II diabetes mellitus (HCC)     Past Surgical History:  Procedure Laterality Date  . AV FISTULA PLACEMENT Left 05/14/2016   Procedure: ARTERIOVENOUS (AV) LEFT UPPER ARM USING GORETEX STRETCHED VASCULAR GRAFT;  Surgeon: Chuck Hint, MD;  Location: [facility] OR;  Service: Vascular;  Laterality: Left;  . INCISION AND DRAINAGE OF WOUND Left 08/30/2013   thigh necrotic wound/notes 08/30/2013  . IR GENERIC HISTORICAL  05/07/2016   IR  FLUORO GUIDE CV LINE RIGHT 05/07/2016 Richarda Overlie, MD [facility]  . IR GENERIC HISTORICAL  05/07/2016   IR US GUIDE VASC ACCESS RIGHT 05/07/2016 Richarda Overlie, MD [facility]  . IRRIGATION AND DEBRIDEMENT  ABSCESS Left 08/30/2013   Procedure: IRRIGATION AND DEBRIDEMENT ABSCESS Left Thigh Necrotic Wound;  Surgeon: [name], MD;  Location: [facility] OR;  Service: General;  Laterality: Left;  . TEE WITHOUT CARDIOVERSION N/A 09/07/2013   Procedure: TRANSESOPHAGEAL ECHOCARDIOGRAM (TEE);  Surgeon: [name], MD;  Location: [facility] ENDOSCOPY;  Service: Cardiovascular;  Laterality: N/A;  . TEE WITHOUT CARDIOVERSION N/A 02/08/2015   Procedure: TRANSESOPHAGEAL ECHOCARDIOGRAM (TEE);  Surgeon: Chilton Si, MD;  Location: [facility] ENDOSCOPY;  Service: Cardiovascular;  Laterality: N/A;  . WOUND DEBRIDEMENT Left 05/21/2016   Procedure: DEBRIDEMENT LEFT THIGH WOUND;  Surgeon: Emelia Loron, MD;  Location: [facility] OR;  Service: General;  Laterality: Left;   Family History:  Family History  Problem Relation Age of Onset  . Diabetes Mother   . Kidney disease Mother    Family Psychiatric  History: Patient has no family history of mental illness.al History:  History  Alcohol Use No     History  Drug Use No    Social History   Social History  . Marital status: Married    Spouse name: N/A  . Number of children: N/A  . Years of education: N/A   Social History Main Topics  . Smoking status: Never Smoker  . Smokeless tobacco: Never Used  . Alcohol use No  . Drug use: No  . Sexual activity: Not Asked   Other Topics Concern  . None   Social History Narrative  . None   Additional Social History:    Allergies:   Allergies  Allergen Reactions  . Azithromycin Other (See Comments)    Nose bleeding event    Labs:  Results for orders placed or performed during the hospital encounter of 05/01/16 (from the past 48 hour(s))  Glucose, capillary     Status: Abnormal   Collection Time: 05/28/16  5:01 PM  Result Value Ref Range   Glucose-Capillary 300 (H) 65 - 99 mg/dL  Glucose, capillary     Status: Abnormal   Collection Time: 05/28/16  9:50 PM  Result Value Ref Range   Glucose-Capillary 378 (H) 65 -  99 mg/dL  CBC     Status: Abnormal   Collection Time: 05/29/16  4:54 AM  Result Value Ref Range   WBC 17.4 (H) 4.0 - 10.5 K/uL   RBC 3.09 (L) 3.87 - 5.11 MIL/uL   Hemoglobin 8.3 (L) 12.0 - 15.0 g/dL   HCT 31.4 (L) 27.6 - 70.1 %   MCV 90.0 78.0 - 100.0 fL   MCH 26.9 26.0 - 34.0 pg   MCHC 29.9 (L) 30.0 - 36.0 g/dL   RDW 10.0 (H) 34.9 - 61.1 %   Platelets 340 150 - 400 K/uL  Renal function panel     Status: Abnormal   Collection Time: 05/29/16  4:54 AM  Result Value Ref Range   Sodium 133 (L) 135 - 145 mmol/L   Potassium 3.2 (L) 3.5 - 5.1 mmol/L   Chloride 95 (L) 101 - 111 mmol/L   CO2 26 22 - 32 mmol/L   Glucose, Bld 254 (H) 65 - 99 mg/dL   BUN 43 (H) 6 - 20 mg/dL   Creatinine, Ser 6.43 (H) 0.44 - 1.00 mg/dL   Calcium 9.1 8.9 - 53.9 mg/dL   Phosphorus 3.5 2.5 -  4.6 mg/dL   Albumin 1.8 (L) 3.5 - 5.0 g/dL   GFR calc non Af Amer 12 (L) >60 mL/min   GFR calc Af Amer 14 (L) >60 mL/min    Comment: (NOTE) The eGFR has been calculated using the CKD EPI equation. This calculation has not been validated in all clinical situations. eGFR's persistently <60 mL/min signify possible Chronic Kidney Disease.    Anion gap 12 5 - 15  Glucose, capillary     Status: Abnormal   Collection Time: 05/29/16 12:17 PM  Result Value Ref Range   Glucose-Capillary 162 (H) 65 - 99 mg/dL  Glucose, capillary     Status: Abnormal   Collection Time: 05/29/16  4:44 PM  Result Value Ref Range   Glucose-Capillary 194 (H) 65 - 99 mg/dL  Glucose, capillary     Status: Abnormal   Collection Time: 05/29/16  9:01 PM  Result Value Ref Range   Glucose-Capillary 202 (H) 65 - 99 mg/dL  CBC     Status: Abnormal   Collection Time: 05/30/16  4:31 AM  Result Value Ref Range   WBC 16.3 (H) 4.0 - 10.5 K/uL   RBC 3.13 (L) 3.87 - 5.11 MIL/uL   Hemoglobin 8.3 (L) 12.0 - 15.0 g/dL   HCT 28.2 (L) 36.0 - 46.0 %   MCV 90.1 78.0 - 100.0 fL   MCH 26.5 26.0 - 34.0 pg   MCHC 29.4 (L) 30.0 - 36.0 g/dL   RDW 16.3 (H) 11.5 -  15.5 %   Platelets 324 150 - 400 K/uL  Renal function panel     Status: Abnormal   Collection Time: 05/30/16  4:31 AM  Result Value Ref Range   Sodium 135 135 - 145 mmol/L   Potassium 3.7 3.5 - 5.1 mmol/L   Chloride 97 (L) 101 - 111 mmol/L   CO2 26 22 - 32 mmol/L   Glucose, Bld 220 (H) 65 - 99 mg/dL   BUN 25 (H) 6 - 20 mg/dL   Creatinine, Ser 2.65 (H) 0.44 - 1.00 mg/dL    Comment: DELTA CHECK NOTED   Calcium 9.0 8.9 - 10.3 mg/dL   Phosphorus 2.7 2.5 - 4.6 mg/dL   Albumin 1.8 (L) 3.5 - 5.0 g/dL   GFR calc non Af Amer 22 (L) >60 mL/min   GFR calc Af Amer 25 (L) >60 mL/min    Comment: (NOTE) The eGFR has been calculated using the CKD EPI equation. This calculation has not been validated in all clinical situations. eGFR's persistently <60 mL/min signify possible Chronic Kidney Disease.    Anion gap 12 5 - 15  Glucose, capillary     Status: Abnormal   Collection Time: 05/30/16  7:54 AM  Result Value Ref Range   Glucose-Capillary 196 (H) 65 - 99 mg/dL    Current Facility-Administered Medications  Medication Dose Route Frequency Provider Last Rate Last Dose  . acetaminophen (TYLENOL) tablet 650 mg  650 mg Oral Q6H Clanford Marisa Hua, MD   650 mg at 05/30/16 0506  . albuterol (PROVENTIL) (2.5 MG/3ML) 0.083% nebulizer solution 2.5 mg  2.5 mg Nebulization Q6H PRN Caren Griffins, MD   2.5 mg at 05/03/16 1617  . albuterol (PROVENTIL) (2.5 MG/3ML) 0.083% nebulizer solution 3 mL  3 mL Inhalation Q M,W,F-HD Jamal Maes, MD   3 mL at 05/29/16 1123  . Ampicillin-Sulbactam (UNASYN) 3 g in sodium chloride 0.9 % 100 mL IVPB  3 g Intravenous Q24H Rolla Flatten, RPH   3 g  at 05/29/16 2058  . atorvastatin (LIPITOR) tablet 20 mg  20 mg Oral Daily Ripudeep Krystal Eaton, MD   20 mg at 05/29/16 1804  . camphor-menthol (SARNA) lotion 1 application  1 application Topical Q7Y PRN Costin Karlyne Greenspan, MD      . Darbepoetin Alfa (ARANESP) injection 150 mcg  150 mcg Intravenous Q Fri-HD Jamal Maes, MD    150 mcg at 05/24/16 0950  . feeding supplement (NEPRO CARB STEADY) liquid 237 mL  237 mL Oral TID BM Ripudeep K Rai, MD   237 mL at 05/30/16 1000  . feeding supplement (PRO-STAT SUGAR FREE 64) liquid 30 mL  30 mL Oral TID Clanford Marisa Hua, MD   30 mL at 05/30/16 1100  . guaiFENesin-dextromethorphan (ROBITUSSIN DM) 100-10 MG/5ML syrup 5 mL  5 mL Oral Q4H PRN Caren Griffins, MD   5 mL at 05/01/16 1620  . heparin injection 5,000 Units  5,000 Units Subcutaneous Q8H Samantha J Rhyne, PA-C   5,000 Units at 05/30/16 0506  . insulin aspart (novoLOG) injection 0-5 Units  0-5 Units Subcutaneous QHS Caren Griffins, MD   2 Units at 05/29/16 2141  . insulin aspart (novoLOG) injection 0-9 Units  0-9 Units Subcutaneous TID WC Ripudeep Krystal Eaton, MD   2 Units at 05/30/16 0856  . insulin aspart (novoLOG) injection 5 Units  5 Units Subcutaneous TID WC Clanford Marisa Hua, MD   5 Units at 05/27/16 1715  . insulin glargine (LANTUS) injection 14 Units  14 Units Subcutaneous QHS Clanford Marisa Hua, MD   14 Units at 05/29/16 2142  . MEDLINE mouth rinse  15 mL Mouth Rinse BID Janece Canterbury, MD   15 mL at 05/29/16 2142  . multivitamin (RENA-VIT) tablet 1 tablet  1 tablet Oral QHS Jamal Maes, MD   1 tablet at 05/29/16 2139  . nitroGLYCERIN (NITROSTAT) SL tablet 0.4 mg  0.4 mg Sublingual Q5 min PRN Rise Patience, MD   0.4 mg at 05/06/16 0217  . nystatin (MYCOSTATIN) 100000 UNIT/ML suspension 500,000 Units  5 mL Oral QID Clanford Marisa Hua, MD   500,000 Units at 05/30/16 1100  . ondansetron (ZOFRAN) tablet 4 mg  4 mg Oral Q6H PRN Caren Griffins, MD   4 mg at 05/07/16 2223   Or  . ondansetron (ZOFRAN) injection 4 mg  4 mg Intravenous Q6H PRN Caren Griffins, MD   4 mg at 05/17/16 2336  . polyethylene glycol (MIRALAX / GLYCOLAX) packet 17 g  17 g Oral BID Janece Canterbury, MD   17 g at 05/30/16 1059  . senna-docusate (Senokot-S) tablet 1 tablet  1 tablet Oral BID Jannette Fogo, NP   1 tablet at 05/30/16  1100  . sevelamer carbonate (RENVELA) tablet 800 mg  800 mg Oral TID WC Estanislado Emms, MD   800 mg at 05/29/16 1803    Musculoskeletal: Strength & Muscle Tone: decreased Gait & Station: unable to stand Patient leans: N/A  Psychiatric Specialty Exam: Physical Exam as per history and physical   ROS generalized weakness, tired, sad and poor energy.  No Fever-chills, No Headache, No changes with Vision or hearing, reports vertigo No problems swallowing food or Liquids, No Chest pain, Cough or Shortness of Breath, No Abdominal pain, No Nausea or Vommitting, Bowel movements are regular, No Blood in stool or Urine, No dysuria, No new skin rashes or bruises, No new joints pains-aches,  No new weakness, tingling, numbness in any extremity, No recent weight gain  or loss, No polyuria, polydypsia or polyphagia,  A full 10 point Review of Systems was done, except as stated above, all other Review of Systems were negative.  Blood pressure (!) 101/34, pulse (!) 110, temperature 98.6 F (37 C), temperature source Oral, resp. rate 18, height '5\' 6"'$  (1.676 m), weight 135 kg (297 lb 9.9 oz), last menstrual period 04/11/2016, SpO2 100 %.Body mass index is 48.04 kg/m.  General Appearance: Disheveled and Guarded  Eye Contact:  Good  Speech:  Slow  Volume:  Decreased  Mood:  Depressed  Affect:  Constricted and Depressed  Thought Process:  Coherent and Goal Directed  Orientation:  Full (Time, Place, and Person)  Thought Content:  WDL  Suicidal Thoughts:  No  Homicidal Thoughts:  No  Memory:  Immediate;   Good Recent;   Fair Remote;   Fair  Judgement:  Intact  Insight:  Fair  Psychomotor Activity:  Decreased  Concentration:  Concentration: Fair and Attention Span: Fair  Recall:  Good  Fund of Knowledge:  Good  Language:  Good  Akathisia:  Negative  Handed:  Right  AIMS (if indicated):     Assets:  Communication Skills Desire for Improvement Financial  Resources/Insurance Housing Leisure Time Resilience  ADL's:  Impaired  Cognition:  WNL  Sleep:        Treatment Plan Summary: 41 years old female with morbid obesity and multiple medical problems including end-stage renal disease and pressure injury of skin presented with depression due to physical illness. Patient is willing to give a trial of medication for depression.  Depression due to physical illness  We start Cymbalta 30 mg daily for depression  Daily contact with patient to assess and evaluate symptoms and progress in treatment and Medication management   Appreciate psychiatric consultation and follow up as clinically required Please contact [phone] or [phone] if needs further assistance  Disposition: Patient does not meet criteria for psychiatric inpatient admission. Supportive therapy provided about ongoing stressors.  Ambrose Finland, MD 05/30/2016 1:04 PM

## 2016-05-30 NOTE — Progress Notes (Signed)
Physical Therapy Treatment Patient Details Name: [name] MRN: [id number] DOB: July 15, 1975 Today's Date: 05/30/2016    History of Present Illness 41 y.o. female with medical history significant of morbid obesity, chronic kidney disease stage IV-V, diabetes mellitus, hypertension, prior CVA, diastolic CHF, presents to the emergency room with chief complaint of weakness as well as fever and diagnosed with acute hypoxic resp failure with acute encephalopathy. S/p L AV graft on 05/14/16.     PT Comments    Attempted to get pt to participate in bed mobility this session. Pt had a BM and had to be totally assisted with bed mobility for hygiene and for transitioning onto lift pad. Pt requires total A x 3 for transition to lift pad and for transfer via Maxi sky to recliner. Pt with a little initiation of movement this session, but very little participation with therapy noted.     Follow Up Recommendations  SNF;Supervision/Assistance - 24 hour     Equipment Recommendations  None recommended by PT    Recommendations for Other Services       Precautions / Restrictions Precautions Precautions: Fall Precaution Comments: bil inner thigh wounds that were surgically debrided in OR Restrictions Weight Bearing Restrictions: No    Mobility  Bed Mobility Overal bed mobility: Needs Assistance Bed Mobility: Rolling Rolling: +2 for physical assistance;Total assist         General bed mobility comments: total A for rolling side to side to assist with cleaning up and placing hoyer lift pad. Attempted to get pt to initate movement.   Transfers Overall transfer level: Needs assistance Equipment used: Ambulation equipment used Transfers: Sit to/from Stand Sit to Stand: Total assist;+2 physical assistance         General transfer comment: Requires Max x 3 to transition tih maxi sky  Ambulation/Gait                 Stairs            Wheelchair Mobility    Modified  Rankin (Stroke Patients Only)       Balance                                            Cognition Arousal/Alertness: Lethargic Behavior During Therapy: Flat affect Overall Cognitive Status: Difficult to assess                                 General Comments: Pt presented with little verbalization with difficulty understanding speech production.       Exercises      General Comments        Pertinent Vitals/Pain Pain Assessment: Faces Faces Pain Scale: Hurts a little bit Pain Location: bilateral LE's with cleaning Pain Descriptors / Indicators: Moaning;Sore    Home Living                      Prior Function            PT Goals (current goals can now be found in the care plan section) Acute Rehab PT Goals Patient Stated Goal: did not state Progress towards PT goals: Not progressing toward goals - comment (little participation noted this session)    Frequency    Min 3X/week      PT Plan Current plan remains  appropriate    Co-evaluation             End of Session   Activity Tolerance: Patient limited by lethargy Patient left: in chair;with call bell/phone within reach;with nursing/sitter in room Nurse Communication: Need for lift equipment PT Visit Diagnosis: Muscle weakness (generalized) (M62.81);Adult, failure to thrive (R62.7) Pain - Right/Left: Left Pain - part of body: Leg     Time: 1340-1409 PT Time Calculation (min) (ACUTE ONLY): 29 min  Charges:  $Therapeutic Activity: 23-37 mins                    G Codes:       Colin Broach PT, DPT  [phone]    Ruel Favors Aletha Halim 05/30/2016, 4:22 PM

## 2016-05-30 NOTE — Progress Notes (Signed)
  [facility] Progress Note   Assessment/ Plan:   1. Acute encephalopathy- ? Component of delirium, depression, etc.  Was more responsive 4/14 according to notes  CT head negative 4/13.  Primary team considering psych eval- this is a good idea 2. ESRD on HD MWF, providing HD on schedule.  Pt needs to come in a chair to HD.  4K bath.   3. 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 Nutrition: albumin 1.9 6. Hypertension:not on any meds at this time 7.  Thigh wounds- s/p debridement, wound vac in place, and on antibiotics (Zosyn--> Unasyn) 8.  Dispo: if she can't sit up for dialysis, this complicates her dispo.     Subjective:    Sleeping, reponsive to noxious stimuli but not conversation   Objective:   BP (!) 119/50 (BP Location: Right Wrist)   Pulse (!) 104   Temp 98.2 F (36.8 C) (Oral)   Resp 18   Ht  (1.676 m)   Wt 135 kg (297 lb 9.9 oz)   LMP 04/11/2016   SpO2 97%   BMI 48.04 kg/m   Physical Exam: Gen: lying in bed. Will respond to noxious stimuli (especially light palpation of L leg wound) CVS:RRR Resp: nonlabored ZOX:WRUEA Ext:L medial thigh wound + woundvac, very tender.  LUE AVG in place Labs: BMET  Recent Labs Lab 05/24/16 0730 05/25/16 0405 05/26/16 0517 05/27/16 0453 05/28/16 0449 05/29/16 0454 05/30/16 0431  NA 134* 135 138 139 138 133* 135  K 3.9 3.4* 3.5 3.9 3.5 3.2* 3.7  CL 94* 93* 96* 95* 96* 95* 97*  CO2 GLUCOSE 203* 178* 211* 182* 178* 254* 220*  BUN 50* 26* 46* 61* 27* 43* 25*  CREATININE 4.47* 3.20* 4.60* 5.54* 3.37* 4.37* 2.65*  CALCIUM 9.0 9.0 9.1 9.5 9.2 9.1 9.0  PHOS 5.5* 4.1 4.5 4.9* 2.9 3.5 2.7   CBC  Recent Labs Lab 05/27/16 0659 05/28/16 0449 05/29/16 0454 05/30/16 0431  WBC 15.7* 15.1* 17.4* 16.3*  HGB 8.8* 9.3* 8.3* 8.3*  HCT 29.1* 30.8* 27.8* 28.2*  MCV 89.3 89.8 90.0 90.1  PLT 322 328 340 324    @ Medications:    . acetaminophen  650 mg Oral Q6H  . albuterol   3 mL Inhalation Q M,W,F-HD  . atorvastatin  20 mg Oral Daily  . darbepoetin (ARANESP) injection - DIALYSIS  150 mcg Intravenous Q Fri-HD  . feeding supplement (NEPRO CARB STEADY)  237 mL Oral TID BM  . feeding supplement (PRO-STAT SUGAR FREE 64)  30 mL Oral TID  . heparin  5,000 Units Subcutaneous Q8H  . insulin aspart  0-5 Units Subcutaneous QHS  . insulin aspart  0-9 Units Subcutaneous TID WC  . insulin aspart  5 Units Subcutaneous TID WC  . insulin glargine  14 Units Subcutaneous QHS  . mouth rinse  15 mL Mouth Rinse BID  . multivitamin  1 tablet Oral QHS  . nystatin  5 mL Oral QID  . polyethylene glycol  17 g Oral BID  . senna-docusate  1 tablet Oral BID  . sevelamer carbonate  800 mg Oral TID WC     Bufford Buttner, MD 05/30/2016, 9:19 AM

## 2016-05-30 NOTE — Progress Notes (Signed)
CSW Intern spoke with patient about discharge plans with Unknown Foley [facility] Representative) at the bedside.  [name] stated that she would like to go home after hospital discharge.  [name] explained to the patient the benefits of short term rehab and will follow up with patient's sister later today regarding [facility]'s admission process.     Renard Hamper, CSW Intern Social Work

## 2016-05-30 NOTE — Progress Notes (Signed)
Patient is up in the chair, used sky lift requiring 3 people. She tolerated good.  Will continue to monitor.

## 2016-05-30 NOTE — Progress Notes (Signed)
[facility]  PROGRESS NOTE  [name] ZOX:[id number] DOB: 1975-12-27 DOA: 05/01/2016 PCP: Marletta Lor, NP   Brief HPI:   41 y.o.femalewith medical history significant formorbid obesity, chronic kidney disease stage IV-V, diabetes mellitus, hypertension, prior CVA, diastolic CHF who presented to ED with progressive weakness, fever for past 1 week prior to this admission. She is actually bed bound for past year or so per her husband report ever since stroke. She also had dry cough and shortness of breath for past 1 week.  Her creatinine was 7.9 (much worse since her recent Cr values of 4-5).  She was started on hemodialysis and underwent left upper arm AV graft placement on 4/3.  She was anticipating going home with her family but because she is too weak to assist with transfers and is morbidly obese, she would need a hoyer lift, hospital bed, and wheelchair for transport to dialysis.  Unfortunately, she was given a hospital bed last year which was "lost" and no home health agencies in the region will provide her with equipment.  She was told that she will need to go to SNF for short term rehabilitation to regain enough strength to at least transfer from a low bed/mattress to a chair in order to get to HD.  She has been having intermittent fevers without defined source.  Her fevers appeared to get better with cessation of antibiotics, however, they returned.  This may be due to a gangrenous area on the posterior left thigh which was debrided by general surgery on 4/10 in the OR.     Subjective   Patient appears withdrawn, does not answer any questions.   Assessment/Plan:     1. ESRD with acute pulmonary edema- Patient was initially placed on aggressive IV Lasix and metolazone and progressed to ESRD. Tunnel dialysis catheter was placed on 05/07/2016. Hematemesis was initiated 05/08/2016. Patient accepted at [facility] [address] AV graft placement done on  05/14/2016. Vascular surgery following. 2. Gangrenous left posterior leg ulcer- patient had "is negative staph from 1 out of 2 bottles but cultures on 04/03/2016 which was felt to be contaminant. Appreciate wound care assistance. General surgery was consulted and patient went to OR with debridement on 05/21/2016 under general anesthesia. Resumed vancomycin and Zosyn. Wound VAC ordered by surgery on 05/24/2016. Antibiotics Descalated to Unasyn on 05/27/2016. 3. Morbid obesity/generalized weakness- patient is unable to assist with transfers. No home health agency to provide equipment and services to home because of lost equipment in the past. Social worker consulted for SNF placement. Rate has markedly gone down since admission. Nutrition consulted. 4. Acute respiratory failure with hypoxia- resolved 5. Acute metabolic encephalopathy-patient's alertness has been fixing and waning. Likely multifactorial from history of CVA, end-stage renal disease, possible uremia, infectious process. There is a question of possible depression, will consult psych for further recommendations. 6. Chest pain-patient had episode of chest pain which resolved, troponin was 0.04 hour telemetry was 2.01. VQ scan was negative for PE 7. Anemia of chronic disease- status post 2 units PRBC transfusion on 05/02/2016, Aranesp 05/02/2016 continue iron supplementation. 8. History of oral thrush-patient was given fluconazole 1, will start insertion swallow 4 times a day started on 05/27/2016. 9. Diabetes mellitus-continue Lantus, increase to 40 minutes. Started 5 units NovoLog 3 times a day with meals. Low-dose sliding scale insulin NovoLog. 10. Bilateral inner thigh wounds, left leg wounds- Gen. surgery was consulted underwent debridement on 05/21/2016, wound VAC is in place. Antibiotics as ordered. 11. Hypertension-  blood pressure is controlled, holding home BP medications 12. 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    DVT prophylaxis: Heparin  Code Status: Full code  Family Communication: No family present at bedside   Disposition Plan: Skilled nursing facility, once patient able to sit in chair for dialysis treatment   Consultants:  Nephrology  Palliative care  Nutrition  Vascular surgery  Procedures:  None   Continuous infusions . ampicillin-sulbactam (UNASYN) IV        Antibiotics:   Anti-infectives    Start     Dose/Rate Route Frequency Ordered Stop   05/29/16 1200  vancomycin (VANCOCIN) 500 mg in sodium chloride 0.9 % 100 mL IVPB  Status:  Discontinued     500 mg 100 mL/hr over 60 Minutes Intravenous Every M-W-F (Hemodialysis) 05/25/16 1126 05/27/16 0855   05/27/16 2000  Ampicillin-Sulbactam (UNASYN) 3 g in sodium chloride 0.9 % 100 mL IVPB     3 g 200 mL/hr over 30 Minutes Intravenous Every 24 hours 05/27/16 1256     05/27/16 1530  fluconazole (DIFLUCAN) tablet 100 mg     100 mg Oral  Once 05/27/16 1530 05/27/16 1714   05/27/16 1122  vancomycin (VANCOCIN) 500-5 MG/100ML-% IVPB  Status:  Discontinued    Comments:  Herriott, [name]   : cabinet override      05/27/16 1122 05/27/16 1126   05/22/16 1200  vancomycin (VANCOCIN) 1,250 mg in sodium chloride 0.9 % 250 mL IVPB  Status:  Discontinued     1,250 mg 250 mL/hr over 60 Minutes Intravenous Every M-W-F (Hemodialysis) 05/20/16 1024 05/20/16 1027   05/22/16 1200  vancomycin (VANCOCIN) IVPB 1000 mg/200 mL premix  Status:  Discontinued     1,000 mg 200 mL/hr over 60 Minutes Intravenous Every M-W-F (Hemodialysis) 05/20/16 1027 05/25/16 1126   05/20/16 1200  vancomycin (VANCOCIN) 2,250 mg in sodium chloride 0.9 % 250 mL IVPB     2,250 mg 250 mL/hr over 60 Minutes Intravenous Every Mon (Hemodialysis) 05/20/16 1024 05/20/16 1513   05/20/16 1030  piperacillin-tazobactam (ZOSYN) IVPB 3.375 g  Status:  Discontinued     3.375 g 12.5 mL/hr over 240 Minutes Intravenous Every  12 hours 05/20/16 1012 05/27/16 1219   05/16/16 1630  cefTRIAXone (ROCEPHIN) 1 g in dextrose 5 % 50 mL IVPB  Status:  Discontinued     1 g 100 mL/hr over 30 Minutes Intravenous Every 24 hours 05/16/16 1625 05/17/16 1641   05/14/16 1430  cefTAZidime (FORTAZ) 1 g in dextrose 5 % 50 mL IVPB  Status:  Discontinued     1 g 100 mL/hr over 30 Minutes Intravenous Every 24 hours 05/14/16 1406 05/15/16 1325   05/14/16 1430  doxycycline (VIBRA-TABS) tablet 100 mg  Status:  Discontinued     100 mg Oral Every 12 hours 05/14/16 1406 05/15/16 1325   05/14/16 0000  ceFAZolin (ANCEF) IVPB 1 g/50 mL premix  Status:  Discontinued    Comments:  Send with pt to OR   1 g 100 mL/hr over 30 Minutes Intravenous On call 05/13/16 1630 05/15/16 1325   05/12/16 1330  cefTAZidime (FORTAZ) 1 g in dextrose 5 % 50 mL IVPB  Status:  Discontinued     1 g 100 mL/hr over 30 Minutes Intravenous Every 24 hours 05/12/16 1319 05/14/16 1406   05/08/16 1000  vancomycin (VANCOCIN) IVPB 1000 mg/200 mL premix     1,000 mg 200 mL/hr over 60 Minutes Intravenous  To Surgery 05/07/16 1904 05/08/16 1004   05/08/16 0902  vancomycin (VANCOCIN) 1-5 GM/200ML-% IVPB    Comments:  Bluford Kaufmann   : cabinet override      05/08/16 0902 05/08/16 0904   05/07/16 1300  ceFAZolin (ANCEF) 3 g in dextrose 5 % 50 mL IVPB     3 g 130 mL/hr over 30 Minutes Intravenous To Radiology 05/07/16 1232 05/07/16 1330   05/02/16 2200  doxycycline (VIBRA-TABS) tablet 100 mg  Status:  Discontinued     100 mg Oral Every 12 hours 05/02/16 1732 05/14/16 1406   05/01/16 2200  piperacillin-tazobactam (ZOSYN) IVPB 2.25 g  Status:  Discontinued     2.25 g 100 mL/hr over 30 Minutes Intravenous Every 8 hours 05/01/16 1324 05/02/16 1729   05/01/16 1330  vancomycin (VANCOCIN) 2,500 mg in sodium chloride 0.9 % 500 mL IVPB     2,500 mg 250 mL/hr over 120 Minutes Intravenous  Once 05/01/16 1309 05/01/16 1551   05/01/16 1300  piperacillin-tazobactam (ZOSYN) IVPB 3.375 g      3.375 g 100 mL/hr over 30 Minutes Intravenous  Once 05/01/16 1258 05/01/16 1340   05/01/16 1300  vancomycin (VANCOCIN) IVPB 1000 mg/200 mL premix  Status:  Discontinued     1,000 mg 200 mL/hr over 60 Minutes Intravenous  Once 05/01/16 1258 05/01/16 1308       Objective   Vitals:   05/29/16 1646 05/29/16 2100 05/30/16 0555 05/30/16 0900  BP: 120/66 133/69 (!) 119/50 (!) 101/34  Pulse: (!) 101 100 (!) 104 (!) 110  Resp: Temp: 98.5 F (36.9 C) 98.3 F (36.8 C) 98.2 F (36.8 C) 98.6 F (37 C)  TempSrc: Oral Oral Oral Oral  SpO2: 100% 100% 97% 100%  Weight:   135 kg (297 lb 9.9 oz)   Height:        Intake/Output Summary (Last 24 hours) at 05/30/16 1535 Last data filed at 05/30/16 1300  Gross per 24 hour  Intake              330 ml  Output                0 ml  Net              330 ml   Filed Weights   05/29/16 0700 05/29/16 1124 05/30/16 0555  Weight: 135 kg (297 lb 9.9 oz) 135 kg (297 lb 9.9 oz) 135 kg (297 lb 9.9 oz)     Physical Examination:  Physical Exam: Eyes: No icterus, extraocular muscles intact  Mouth: Oral mucosa is moist, no lesions on palate,  Neck: Supple, no deformities, masses, or tenderness Lungs: Normal respiratory effort, bilateral clear to auscultation, no crackles or wheezes.  Heart: Regular rate and rhythm, S1 and S2 normal, no murmurs, rubs auscultated Abdomen: BS normoactive,soft,nondistended,non-tender to palpation,no organomegaly Extremities: No pretibial edema, no erythema, no cyanosis, no clubbing Neuro : Appears withdrawn, does not answer questions.     Data Reviewed: I have personally reviewed following labs and imaging studies  CBG:  Recent Labs Lab 05/29/16 1217 05/29/16 1644 05/29/16 2101 05/30/16 0754 05/30/16 1415  GLUCAP 162* 194* 202* 196* 214*    CBC:  Recent Labs Lab 05/27/16 0453 05/27/16 0659 05/28/16 0449 05/29/16 0454 05/30/16 0431  WBC 15.9* 15.7* 15.1* 17.4* 16.3*  HGB 8.7* 8.8* 9.3*  8.3* 8.3*  HCT 28.5* 29.1* 30.8* 27.8* 28.2*  MCV 88.8 89.3 89.8 90.0 90.1  PLT 347 322 328  340 324    Basic Metabolic Panel:  Recent Labs Lab 05/26/16 0517 05/27/16 0453 05/28/16 0449 05/29/16 0454 05/30/16 0431  NA 138 139 138 133* 135  K 3.5 3.9 3.5 3.2* 3.7  CL 96* 95* 96* 95* 97*  CO2 GLUCOSE 211* 182* 178* 254* 220*  BUN 46* 61* 27* 43* 25*  CREATININE 4.60* 5.54* 3.37* 4.37* 2.65*  CALCIUM 9.1 9.5 9.2 9.1 9.0  PHOS 4.5 4.9* 2.9 3.5 2.7    Recent Results (from the past 240 hour(s))  Surgical pcr screen     Status: None   Collection Time: 05/21/16  1:27 AM  Result Value Ref Range Status   MRSA, PCR NEGATIVE NEGATIVE Final   Staphylococcus aureus NEGATIVE NEGATIVE Final    Comment:        The Xpert SA Assay (FDA approved for NASAL specimens in patients over 77 years of age), is one component of a comprehensive surveillance program.  Test performance has been validated by [facility] for patients greater than or equal to 27 year old. It is not intended to diagnose infection nor to guide or monitor treatment.   Aerobic/Anaerobic Culture (surgical/deep wound)     Status: Abnormal   Collection Time: 05/21/16  9:35 AM  Result Value Ref Range Status   Specimen Description WOUND LEFT THIGH  Final   Special Requests NONE  Final   Gram Stain   Final    RARE WBC PRESENT, PREDOMINANTLY PMN FEW GRAM VARIABLE ROD    Culture (A)  Final    MULTIPLE ORGANISMS PRESENT, NONE PREDOMINANT ABUNDANT BACTEROIDES OVATUS BETA LACTAMASE POSITIVE    Report Status 05/28/2016 FINAL  Final     Liver Function Tests:  Recent Labs Lab 05/26/16 0517 05/27/16 0453 05/28/16 0449 05/29/16 0454 05/30/16 0431  ALBUMIN 1.8* 1.9* 1.9* 1.8* 1.8*   No results for input(s): LIPASE, AMYLASE in the last 168 hours. No results for input(s): AMMONIA in the last 168 hours.  Cardiac Enzymes: No results for input(s): CKTOTAL, CKMB, CKMBINDEX, TROPONINI in the last 168  hours. BNP (last 3 results)  Recent Labs  03/22/16 0603 03/24/16 0332 05/01/16 1218  BNP 646.0* 344.9* 207.5*    ProBNP (last 3 results) No results for input(s): PROBNP in the last 8760 hours.    Studies: No results found.  Scheduled Meds: . acetaminophen  650 mg Oral Q6H  . albuterol  3 mL Inhalation Q M,W,F-HD  . atorvastatin  20 mg Oral Daily  . darbepoetin (ARANESP) injection - DIALYSIS  150 mcg Intravenous Q Fri-HD  . feeding supplement (NEPRO CARB STEADY)  237 mL Oral TID BM  . feeding supplement (PRO-STAT SUGAR FREE 64)  30 mL Oral TID  . heparin  5,000 Units Subcutaneous Q8H  . insulin aspart  0-5 Units Subcutaneous QHS  . insulin aspart  0-9 Units Subcutaneous TID WC  . insulin aspart  5 Units Subcutaneous TID WC  . insulin glargine  14 Units Subcutaneous QHS  . mouth rinse  15 mL Mouth Rinse BID  . multivitamin  1 tablet Oral QHS  . nystatin  5 mL Oral QID  . polyethylene glycol  17 g Oral BID  . senna-docusate  1 tablet Oral BID  . sevelamer carbonate  800 mg Oral TID WC      Time spent: 25 min  [facility]   [facility] Pager [phone]. If 7PM-7AM, please contact night-coverage at [email or web address], Office  [phone]  password [facility] 05/30/2016, 3:35  PM  LOS: 29 days

## 2016-05-31 DIAGNOSIS — E1165 Type 2 diabetes mellitus with hyperglycemia: Secondary | ICD-10-CM

## 2016-05-31 DIAGNOSIS — Z794 Long term (current) use of insulin: Secondary | ICD-10-CM

## 2016-05-31 DIAGNOSIS — F0631 Mood disorder due to known physiological condition with depressive features: Secondary | ICD-10-CM

## 2016-05-31 DIAGNOSIS — E118 Type 2 diabetes mellitus with unspecified complications: Secondary | ICD-10-CM

## 2016-05-31 DIAGNOSIS — N179 Acute kidney failure, unspecified: Principal | ICD-10-CM

## 2016-05-31 LAB — RENAL FUNCTION PANEL
ANION GAP: 14 (ref 5–15)
Albumin: 1.9 g/dL — ABNORMAL LOW (ref 3.5–5.0)
BUN: 44 mg/dL — ABNORMAL HIGH (ref 6–20)
CALCIUM: 9.4 mg/dL (ref 8.9–10.3)
CO2: 25 mmol/L (ref 22–32)
Chloride: 96 mmol/L — ABNORMAL LOW (ref 101–111)
Creatinine, Ser: 3.88 mg/dL — ABNORMAL HIGH (ref 0.44–1.00)
GFR calc non Af Amer: 14 mL/min — ABNORMAL LOW (ref >60)
GFR, EST AFRICAN AMERICAN: 16 mL/min — AB (ref >60)
Glucose, Bld: 247 mg/dL — ABNORMAL HIGH (ref 65–99)
Phosphorus: 3.4 mg/dL (ref 2.5–4.6)
Potassium: 5.2 mmol/L — ABNORMAL HIGH (ref 3.5–5.1)
Sodium: 135 mmol/L (ref 135–145)

## 2016-05-31 LAB — CBC
HCT: 31.1 % — ABNORMAL LOW (ref 36.0–46.0)
HEMOGLOBIN: 9.3 g/dL — AB (ref 12.0–15.0)
MCH: 27 pg (ref 26.0–34.0)
MCHC: 29.9 g/dL — ABNORMAL LOW (ref 30.0–36.0)
MCV: 90.1 fL (ref 78.0–100.0)
Platelets: 363 10*3/uL (ref 150–400)
RBC: 3.45 MIL/uL — AB (ref 3.87–5.11)
RDW: 16.6 % — ABNORMAL HIGH (ref 11.5–15.5)
WBC: 14.9 10*3/uL — ABNORMAL HIGH (ref 4.0–10.5)

## 2016-05-31 LAB — GLUCOSE, CAPILLARY
GLUCOSE-CAPILLARY: 167 mg/dL — AB (ref 65–99)
Glucose-Capillary: 180 mg/dL — ABNORMAL HIGH (ref 65–99)
Glucose-Capillary: 146 mg/dL — ABNORMAL HIGH (ref 65–99)

## 2016-05-31 LAB — PARATHYROID HORMONE, INTACT (NO CA): PTH: 188 pg/mL — ABNORMAL HIGH (ref 15–65)

## 2016-05-31 LAB — T4, FREE: Free T4: 1.44 ng/dL — ABNORMAL HIGH (ref 0.61–1.12)

## 2016-05-31 LAB — TSH: TSH: 3.096 u[IU]/mL (ref 0.350–4.500)

## 2016-05-31 MED ORDER — DARBEPOETIN ALFA 150 MCG/0.3ML IJ SOSY
PREFILLED_SYRINGE | INTRAMUSCULAR | Status: AC
  Filled 2016-05-31: qty 0.3

## 2016-05-31 MED ORDER — PRO-STAT SUGAR FREE PO LIQD: 30.0000 mL | Freq: Three times a day (TID) | ORAL | 0 refills | Status: AC

## 2016-05-31 MED ORDER — DULOXETINE HCL 30 MG PO CPEP: 30.0000 mg | ORAL_CAPSULE | Freq: Every day | ORAL | 3 refills | Status: AC

## 2016-05-31 MED ORDER — NYSTATIN 100000 UNIT/ML MT SUSP: 5.0000 mL | Freq: Four times a day (QID) | OROMUCOSAL | 0 refills | Status: DC

## 2016-05-31 MED ORDER — SEVELAMER CARBONATE 800 MG PO TABS: 800.0000 mg | ORAL_TABLET | Freq: Three times a day (TID) | ORAL | Status: AC

## 2016-05-31 MED ORDER — INSULIN GLARGINE 100 UNIT/ML ~~LOC~~ SOLN: 14.0000 [IU] | Freq: Every day | SUBCUTANEOUS | 11 refills | Status: AC

## 2016-05-31 MED ORDER — AMOXICILLIN-POT CLAVULANATE 500-125 MG PO TABS: 1.0000 | ORAL_TABLET | Freq: Every day | ORAL | 0 refills | Status: AC

## 2016-05-31 NOTE — Progress Notes (Signed)
Report given to Jenean Lindau of [place] at this time.

## 2016-05-31 NOTE — Consult Note (Signed)
WOC Nurse wound follow up Wound type: surgical  s/p debridement ? Etiology. She initially presented with severe MASD and full thickness eschar Measurement: 16cm x 5.5cm x 3.5cm with undermining from 7-11 o'clock that is at greatest point 5.5 cm  Wound ZOX:WRUE necrotic tissue, CCS to assess. Some of this tissue is just yellow dry  Drainage (amount, consistency, odor) moderate, not purulent Periwound: brawny edema, skin changes Dressing procedure/placement/frequency:  Removed NPWT VAC dressing for transfer to the SNF. Will need NPWT at the SNF upon arrival.  CCS agreed that patient will need NPWT at SNF.  Orders placed for moist to moist saline gauze dressing, top with dry dressing.    Discussed POC with nurse and CCS PA Re consult if needed, will not follow at this time. Thanks  Desani Sprung [employer], RN,CWOCN, CNS [phone])

## 2016-05-31 NOTE — Procedures (Signed)
Patient seen and examined on Hemodialysis.  She is in the chair. QB 350 via TDC, LUE AVG in place with + T/B. UF goal 500 mL Treatment adjusted as needed.  Bufford Buttner MD [facility] 9:07 AM

## 2016-05-31 NOTE — Progress Notes (Signed)
Spoke with night shift nurse this morning as a reminder that patient should do her best to go to HD in a chair this morning. RN stated patient spent most of night in a chair and staff was aware patient is to go to HD in a chair this morning.

## 2016-05-31 NOTE — Progress Notes (Signed)
Dressing change from wound vac to wet to dry dressing on her left thigh.

## 2016-05-31 NOTE — Progress Notes (Signed)
[facility] Progress Note  10 Days Post-Op  Subjective: CC: thigh wound s/p debridement  Just returned from dialysis. No complaints.  Objective: Vital signs in last 24 hours: Temp:  [97.5 F (36.4 C)-98.8 F (37.1 C)] 98.8 F (37.1 C) (04/20 0820) Pulse Rate:  [115-134] 120 (04/20 1002) Resp:  [16-18] 18 (04/20 0824) BP: (105-144)/(54-99) 105/67 (04/20 1002) SpO2:  [100 %] 100 % (04/20 0820) Weight:  [135 kg (297 lb 9.9 oz)] 135 kg (297 lb 9.9 oz) (04/20 0820) Last BM Date: 05/30/16  Intake/Output from previous day: 04/19 0701 - 04/20 0700 In: 360 [P.O.:260; IV Piggyback:100] Out: 0  Intake/Output this shift: No intake/output data recorded.  PE: Gen:  NAD, cooperative  Left thigh: VAC Removed: new development of some yellow slough superior and lateral aspect of wound. No erythema or purulence.     Lab Results:   Recent Labs  05/30/16 0431 05/31/16 0443  WBC 16.3* 14.9*  HGB 8.3* 9.3*  HCT 28.2* 31.1*  PLT 324 363   BMET  Recent Labs  05/30/16 0431 05/31/16 0443  NA 135 135  K 3.7 5.2*  CL 97* 96*  CO2 26 25  GLUCOSE 220* 247*  BUN 25* 44*  CREATININE 2.65* 3.88*  CALCIUM 9.0 9.4   PT/INR No results for input(s): LABPROT, INR in the last 72 hours. CMP     Component Value Date/Time   NA 135 05/31/2016 0443   NA 137 09/16/2013 1301   K 5.2 (H) 05/31/2016 0443   K 4.7 09/16/2013 1301   CL 96 (L) 05/31/2016 0443   CL 108 (H) 09/16/2013 1301   CO2 25 05/31/2016 0443   CO2 24 09/16/2013 1301   GLUCOSE 247 (H) 05/31/2016 0443   GLUCOSE 125 (H) 09/16/2013 1301   BUN 44 (H) 05/31/2016 0443   BUN 15 09/16/2013 1301   CREATININE 3.88 (H) 05/31/2016 0443   CREATININE 1.79 (H) 09/16/2013 1301   CALCIUM 9.4 05/31/2016 0443   CALCIUM 8.3 (L) 09/16/2013 1301   PROT 7.1 05/02/2016 0555   PROT 7.0 09/16/2013 1301   ALBUMIN 1.9 (L) 05/31/2016 0443   ALBUMIN 1.3 (L) 09/16/2013 1301   AST 10 (L) 05/02/2016 0555   AST 10 (L) 09/16/2013 1301    ALT 7 (L) 05/02/2016 0555   ALT 11 (L) 09/16/2013 1301   ALKPHOS 60 05/02/2016 0555   ALKPHOS 59 09/16/2013 1301   BILITOT 1.0 05/02/2016 0555   BILITOT 0.2 09/16/2013 1301   GFRNONAA 14 (L) 05/31/2016 0443   GFRNONAA 36 (L) 09/16/2013 1301   GFRAA 16 (L) 05/31/2016 0443   GFRAA 42 (L) 09/16/2013 1301   Lipase  No results found for: LIPASE     Studies/Results: No results found.  Anti-infectives: Anti-infectives    Start     Dose/Rate Route Frequency Ordered Stop   05/29/16 1200  vancomycin (VANCOCIN) 500 mg in sodium chloride 0.9 % 100 mL IVPB  Status:  Discontinued     500 mg 100 mL/hr over 60 Minutes Intravenous Every M-W-F (Hemodialysis) 05/25/16 1126 05/27/16 0855   05/27/16 2000  Ampicillin-Sulbactam (UNASYN) 3 g in sodium chloride 0.9 % 100 mL IVPB     3 g 200 mL/hr over 30 Minutes Intravenous Every 24 hours 05/27/16 1256     05/27/16 1530  fluconazole (DIFLUCAN) tablet 100 mg     100 mg Oral  Once 05/27/16 1530 05/27/16 1714   05/27/16 1122  vancomycin (VANCOCIN) 500-5 MG/100ML-% IVPB  Status:  Discontinued  Comments:  Herriott, [name]   : cabinet override      05/27/16 1122 05/27/16 1126   05/22/16 1200  vancomycin (VANCOCIN) 1,250 mg in sodium chloride 0.9 % 250 mL IVPB  Status:  Discontinued     1,250 mg 250 mL/hr over 60 Minutes Intravenous Every M-W-F (Hemodialysis) 05/20/16 1024 05/20/16 1027   05/22/16 1200  vancomycin (VANCOCIN) IVPB 1000 mg/200 mL premix  Status:  Discontinued     1,000 mg 200 mL/hr over 60 Minutes Intravenous Every M-W-F (Hemodialysis) 05/20/16 1027 05/25/16 1126   05/20/16 1200  vancomycin (VANCOCIN) 2,250 mg in sodium chloride 0.9 % 250 mL IVPB     2,250 mg 250 mL/hr over 60 Minutes Intravenous Every Mon (Hemodialysis) 05/20/16 1024 05/20/16 1513   05/20/16 1030  piperacillin-tazobactam (ZOSYN) IVPB 3.375 g  Status:  Discontinued     3.375 g 12.5 mL/hr over 240 Minutes Intravenous Every 12 hours 05/20/16 1012 05/27/16 1219    05/16/16 1630  cefTRIAXone (ROCEPHIN) 1 g in dextrose 5 % 50 mL IVPB  Status:  Discontinued     1 g 100 mL/hr over 30 Minutes Intravenous Every 24 hours 05/16/16 1625 05/17/16 1641   05/14/16 1430  cefTAZidime (FORTAZ) 1 g in dextrose 5 % 50 mL IVPB  Status:  Discontinued     1 g 100 mL/hr over 30 Minutes Intravenous Every 24 hours 05/14/16 1406 05/15/16 1325   05/14/16 1430  doxycycline (VIBRA-TABS) tablet 100 mg  Status:  Discontinued     100 mg Oral Every 12 hours 05/14/16 1406 05/15/16 1325   05/14/16 0000  ceFAZolin (ANCEF) IVPB 1 g/50 mL premix  Status:  Discontinued    Comments:  Send with pt to OR   1 g 100 mL/hr over 30 Minutes Intravenous On call 05/13/16 1630 05/15/16 1325   05/12/16 1330  cefTAZidime (FORTAZ) 1 g in dextrose 5 % 50 mL IVPB  Status:  Discontinued     1 g 100 mL/hr over 30 Minutes Intravenous Every 24 hours 05/12/16 1319 05/14/16 1406   05/08/16 1000  vancomycin (VANCOCIN) IVPB 1000 mg/200 mL premix     1,000 mg 200 mL/hr over 60 Minutes Intravenous To Surgery 05/07/16 1904 05/08/16 1004   05/08/16 0902  vancomycin (VANCOCIN) 1-5 GM/200ML-% IVPB    Comments:  Matilde Haymaker, [name]   : cabinet override      05/08/16 0902 05/08/16 0904   05/07/16 1300  ceFAZolin (ANCEF) 3 g in dextrose 5 % 50 mL IVPB     3 g 130 mL/hr over 30 Minutes Intravenous To Radiology 05/07/16 1232 05/07/16 1330   05/02/16 2200  doxycycline (VIBRA-TABS) tablet 100 mg  Status:  Discontinued     100 mg Oral Every 12 hours 05/02/16 1732 05/14/16 1406   05/01/16 2200  piperacillin-tazobactam (ZOSYN) IVPB 2.25 g  Status:  Discontinued     2.25 g 100 mL/hr over 30 Minutes Intravenous Every 8 hours 05/01/16 1324 05/02/16 1729   05/01/16 1330  vancomycin (VANCOCIN) 2,500 mg in sodium chloride 0.9 % 500 mL IVPB     2,500 mg 250 mL/hr over 120 Minutes Intravenous  Once 05/01/16 1309 05/01/16 1551   05/01/16 1300  piperacillin-tazobactam (ZOSYN) IVPB 3.375 g     3.375 g 100 mL/hr over 30 Minutes  Intravenous  Once 05/01/16 1258 05/01/16 1340   05/01/16 1300  vancomycin (VANCOCIN) IVPB 1000 mg/200 mL premix  Status:  Discontinued     1,000 mg 200 mL/hr over 60 Minutes Intravenous  Once  05/01/16 1258 05/01/16 1308       Assessment/Plan left thigh soft tissue infection - s/p excision of 15x6x2.5cm area of skin and sub tissue - continue negative pressure wound therapy   Acute metabolic encephalopathy/Hx of CVA with residual deficit ESRD HD MWF, in chair. 30 kg down but still edema. Pulmonary edema/Acute respiratory failure - now resolved on room air HTN  AODM with poor control - diabetic neuropathy Anemia HPTH vit d Body mass index is 49.53 kg/m. Immobility Fevers undergoing eval Anxiety Depression - now on a trial of Cymbalta   JOA:CZYSA diet ID: Zosyn/Vancomycin per pharmacy 05/20/16 =>>day 5(Multiple organism nothing predominate) DVT: Heparin  Plan: no acute surgical needs Could benefit from hydrotherapy, though this is not offered at [facility]. Overall I think patients wound healing will continue be impaired by MMP above and continuation of VAC and close observation is adequate. continue VAC, follow up with Dr. Dwain Sarna in 7-10 days.  Per primary, discharging to SNF today   LOS: 30 days    [name] , [facility] 05/31/2016, 10:06 AM Pager: [phone] Consults: [phone] Mon-Fri 7:00 am-4:30 pm Sat-Sun 7:00 am-11:30 am

## 2016-05-31 NOTE — Discharge Summary (Signed)
Physician Discharge Summary  [name] XBJ:[id number] DOB: [birth year] DOA: 05/01/2016  PCP: Marletta Lor, NP  Admit date: 05/01/2016 Discharge date: 05/31/2016  Time spent: 35* minutes  Recommendations for Outpatient Follow-up:  1. Follow up general surgery as outpatient 2. Continue Augmentin 500/125 one tablet by mouth daily for 9 more days. Stop on 06/09/2016 3. Continue nystatin 100,000 units swish and swallow 4 times daily for 7 more days, stop on Jul 03, 2016 4. Continue wound VAC and wound care at skilled facility.   Discharge Diagnoses:  Principal Problem:   Depression due to physical illness Active Problems:   Morbid obesity (HCC)   Essential hypertension   History of CVA with residual deficit   Anemia of chronic disease   Lymphedema   Debility   Acute pulmonary edema (HCC)   Acute metabolic encephalopathy   Acute renal failure superimposed on stage 5 chronic kidney disease, not on chronic dialysis (HCC)   Acute respiratory failure with hypoxia (HCC)   Right middle lobe pneumonia (HCC)   Leukocytosis   Uncontrolled diabetes mellitus with diabetic nephropathy, with long-term current use of insulin (HCC)   Dyslipidemia associated with type 2 diabetes mellitus (HCC)   ESRD (end stage renal disease) (HCC)   Goals of care, counseling/discussion   Palliative care by specialist   Pressure injury of skin   Discharge Condition: Stable  Diet recommendation: Heart healthy diet  Filed Weights   05/29/16 1124 05/30/16 0555 05/31/16 0820  Weight: 135 kg (297 lb 9.9 oz) 135 kg (297 lb 9.9 oz) 135 kg (297 lb 9.9 oz)    History of present illness:  41 y.o.femalewith medical history significant formorbid obesity, chronic kidney disease stage IV-V, diabetes mellitus, hypertension, prior CVA, diastolic CHF who presented to ED with progressive weakness, fever for past 1 week prior to this admission. She is actually bed bound for past year or so per her husband report  ever since stroke. She also had dry cough and shortness of breath for past 1 week.  Her creatinine was 7.9 (much worse since her recent Cr values of 4-5).  She was started on hemodialysis and underwent left upper arm AV graft placement on 4/3.  She was anticipating going home with her family but because she is too weak to assist with transfers and is morbidly obese, she would need a hoyer lift, hospital bed, and wheelchair for transport to dialysis.  Unfortunately, she was given a hospital bed last year which was "lost" and no home health agencies in the region will provide her with equipment.  She was told that she will need to go to SNF for short term rehabilitation to regain enough strength to at least transfer from a low bed/mattress to a chair in order to get to HD.  She has been having intermittent fevers without defined source.  Her fevers appeared to get better with cessation of antibiotics, however, they returned.  This may be due to a gangrenous area on the posterior left thigh which was debrided by general surgery on 4/10 in the OR.   Hospital Course:  1. ESRD with acute pulmonary edema- Patient was initially placed on aggressive IV Lasix and metolazone and progressed to ESRD. Tunnel dialysis catheter was placed on 05/07/2016. Hematemesis was initiated 05/08/2016. Patient accepted at [facility] [address] AV graft placement done on 05/14/2016. Vascular surgery following. 2. Gangrenous left posterior leg ulcer- patient had "coagulase negative staph from 1 out of 2 bottles but cultures on 04/03/2016 which was felt  to be contaminant. Appreciate wound care assistance. General surgery was consulted and patient went to OR with debridement on 05/21/2016 under general anesthesia. Resumed vancomycin and Zosyn. Wound VAC ordered by surgery on 05/24/2016. Antibiotics Descalated to Unasyn on 05/27/2016. At this time to discontinue patient on Augmentin 500/125 one tablet by mouth daily for 9 more  days. Stop on 05/31/4016. General surgery to see and change wound VAC before discharge today. 3. Morbid obesity/generalized weakness- patient is unable to assist with transfers. No home health agency to provide equipment and services to home because of lost equipment in the past. Social worker consulted for SNF placement.  4. Acute respiratory failure with hypoxia- resolved 5. Acute metabolic encephalopathy-patient's alertness has been waxing and waning. Likely multifactorial from history of CVA, end-stage renal disease, possible uremia, infectious process. There was a question of possible depression, psych was consulted, and patient has been started on Cymbalta 20 mg by mouth daily. 6. Chest pain-patient had episode of chest pain which resolved, troponin was 0.04 hour telemetry was 2.01. VQ scan was negative for PE 7. Anemia of chronic disease- status post 2 units PRBC transfusion on 05/02/2016, Aranesp 05/02/2016  8. History of oral thrush-patient was given fluconazole 1, will start insertion swallow 4 times a day started on 05/27/2016. We'll continue with nystatin 4 times daily for 7 more days. Stop on 06/08/2016. 9. Diabetes mellitus-continue Lantus, in.   sliding scale insulin NovoLog. 10. Bilateral inner thigh wounds, left leg wounds- Gen. surgery was consulted underwent debridement on 05/21/2016, wound VAC is in place. Antibiotics as ordered. 11. Hypertension- blood pressure is controlled, will restart Coreg 25 mg by mouth twice a day. 12. Depression due to medical illness- patient started on Cymbalta 20 mg by mouth daily.  Procedures:  AV graft placement on 05/14/2016.  Debridement of left thigh wound  Consultations:  Nephrology  Palliative care  Nutrition  Vascular surgery   Discharge Exam: Vitals:   05/31/16 1030 05/31/16 1059  BP: 107/66 108/84  Pulse: (!) 118 (!) 121  Resp:    Temp:      General: Appears in no acute distress Cardiovascular: RRR,  S1-S2 Respiratory: Clear to auscultation bilaterally  Discharge Instructions   Discharge Instructions    (HEART FAILURE PATIENTS) Call MD:  Anytime you have any of the following symptoms: 1) 3 pound weight gain in 24 hours or 5 pounds in 1 week 2) shortness of breath, with or without a dry hacking cough 3) swelling in the hands, feet or stomach 4) if you have to sleep on extra pillows at night in order to breathe.    Complete by:  As directed    Call MD for:  difficulty breathing, headache or visual disturbances    Complete by:  As directed    Call MD for:  extreme fatigue    Complete by:  As directed    Call MD for:  hives    Complete by:  As directed    Call MD for:  persistant dizziness or light-headedness    Complete by:  As directed    Call MD for:  persistant nausea and vomiting    Complete by:  As directed    Call MD for:  redness, tenderness, or signs of infection (pain, swelling, redness, odor or green/yellow discharge around incision site)    Complete by:  As directed    Call MD for:  severe uncontrolled pain    Complete by:  As directed    Call MD for:  temperature >100.4    Complete by:  As directed    Diet - low sodium heart healthy    Complete by:  As directed    Diet - low sodium heart healthy    Complete by:  As directed    Increase activity slowly    Complete by:  As directed    Increase activity slowly    Complete by:  As directed      Current Discharge Medication List    START taking these medications   Details  Amino Acids-Protein Hydrolys (FEEDING SUPPLEMENT, PRO-STAT SUGAR FREE 64,) LIQD Take 30 mLs by mouth 3 (three) times daily. Qty: 900 mL, Refills: 0    amoxicillin-clavulanate (AUGMENTIN) 500-125 MG tablet Take 1 tablet (500 mg total) by mouth at bedtime. Qty: 9 tablet, Refills: 0    DULoxetine (CYMBALTA) 30 MG capsule Take 1 capsule (30 mg total) by mouth daily. Qty: 30 capsule, Refills: 3    insulin glargine (LANTUS) 100 UNIT/ML injection  Inject 0.14 mLs (14 Units total) into the skin at bedtime. Qty: 10 mL, Refills: 11    multivitamin (RENA-VIT) TABS tablet Take 1 tablet by mouth at bedtime. Qty: 30 each, Refills: 0    nystatin (MYCOSTATIN) 100000 UNIT/ML suspension Take 5 mLs (500,000 Units total) by mouth 4 (four) times daily. Qty: 60 mL, Refills: 0    senna-docusate (SENOKOT-S) 8.6-50 MG tablet Take 1 tablet by mouth 2 (two) times daily. Qty: 60 tablet, Refills: 0    sevelamer carbonate (RENVELA) 800 MG tablet Take 1 tablet (800 mg total) by mouth 3 (three) times daily with meals.      CONTINUE these medications which have NOT CHANGED   Details  albuterol (PROVENTIL) (2.5 MG/3ML) 0.083% nebulizer solution Take 3 mLs (2.5 mg total) by nebulization every 6 (six) hours as needed for wheezing or shortness of breath. Qty: 75 mL, Refills: 0    atorvastatin (LIPITOR) 20 MG tablet Take 20 mg by mouth daily.    carvedilol (COREG) 25 MG tablet Take 1 tablet (25 mg total) by mouth 2 (two) times daily with a meal. Qty: 60 tablet, Refills: 1    insulin aspart (NOVOLOG) 100 UNIT/ML injection Inject 0-20 Units into the skin 3 (three) times daily with meals. CBG < 70: implement hypoglycemia protocol CBG 70 - 120: 0 units CBG 121 - 150: 3 units CBG 151 - 200: 4 units CBG 201 - 250: 7 units CBG 251 - 300: 11 units CBG 301 - 350: 15 units CBG 351 - 400: 20 units Qty: 10 mL, Refills: 0    camphor-menthol (SARNA) lotion Apply 1 application topically every 8 (eight) hours as needed for itching. Qty: 222 mL, Refills: 0      STOP taking these medications     albuterol (PROAIR HFA) 108 (90 Base) MCG/ACT inhaler      amLODipine (NORVASC) 10 MG tablet      ferrous sulfate 325 (65 FE) MG tablet      furosemide (LASIX) 80 MG tablet      hydrALAZINE (APRESOLINE) 50 MG tablet      isosorbide mononitrate (IMDUR) 30 MG 24 hr tablet      metolazone (ZAROXOLYN) 10 MG tablet      polyethylene glycol powder (GLYCOLAX/MIRALAX)  powder      potassium chloride SA (K-DUR,KLOR-CON) 20 MEQ tablet      vitamin A 8000 UNIT capsule        Allergies  Allergen Reactions  . Azithromycin Other (See Comments)  Nose bleeding event    Contact information for follow-up providers    [facility] Follow up.   Specialty:  Vascular Surgery Why:  Please contact our office as needed to schedule a follow up appointent.  Contact information: [address] [place] [address] [phone]       Marletta Lor, NP Follow up.   Specialty:  Nurse Practitioner Why:  Please call office on Monday 05/18/2016 to schedule a follow up appointment. Thank you.  Contact information: [facility] [address] [place] [address] [phone]            Contact information for after-discharge care    Destination    [facility] Follow up.   Specialty:  Skilled Nursing Facility Contact information: [address] [place] [address] [phone]                   The results of significant diagnostics from this hospitalization (including imaging, microbiology, ancillary and laboratory) are listed below for reference.    Significant Diagnostic Studies: Ct Abdomen Pelvis Wo Contrast  Result Date: 05/20/2016 CLINICAL DATA:  41 y/o  F; fever of unknown origin. EXAM: CT ABDOMEN AND PELVIS WITHOUT CONTRAST TECHNIQUE: Multidetector CT imaging of the abdomen and pelvis was performed following the standard protocol without IV contrast. COMPARISON:  None. FINDINGS: Lower chest: No acute abnormality. Hepatobiliary: No focal liver abnormality is seen. No gallstones, gallbladder wall thickening, or biliary dilatation. Pancreas: Unremarkable. No pancreatic ductal dilatation or surrounding inflammatory changes. Spleen: Normal in size without focal abnormality. Adrenals/Urinary Tract: Adrenal glands are unremarkable. Kidneys are normal, without  renal calculi, focal lesion, or hydronephrosis. Bladder is unremarkable. Stomach/Bowel: Stomach is within normal limits. Appendix appears normal. No evidence of bowel wall thickening, distention, or inflammatory changes. Vascular/Lymphatic: Aortic atherosclerosis. No enlarged abdominal or pelvic lymph nodes. Reproductive: Uterus and bilateral adnexa are unremarkable. Other: No abdominal wall hernia or abnormality. No abdominopelvic ascites. Musculoskeletal: Partially visualized is apparent fat stranding with skin laceration involving the left medial arm probably related to recent placement of AV fistula. Within the field of view no discrete fluid collection is identified. No acute osseous abnormality identified. Lumbar spondylosis predominantly at the L5-S1 level. IMPRESSION: 1. No acute process identified in the abdomen or pelvis as explanation for fever. 2. Air and fat stranding within the left medial arm likely representing postsurgical changes related to recent placement of AV fistula. Within the field of view no discrete fluid collection is identified. Electronically Signed   By: Mitzi Hansen M.D.   On: 05/20/2016 00:01   Dg Chest 2 View  Result Date: 05/01/2016 CLINICAL DATA:  Fever and weakness. EXAM: CHEST  2 VIEW COMPARISON:  03/22/2016. FINDINGS: Cardiomegaly with mild bilateral interstitial prominence. No pleural effusion or pneumothorax. No acute bony abnormality. IMPRESSION: Cardiomegaly with mild bilateral interstitial prominence consistent with CHF. Findings have improved from prior study of 03/22/2016. Given patient's history of fever pneumonitis cannot be excluded. Electronically Signed   By: Maisie Fus  Register   On: 05/01/2016 13:30   Ct Head Wo Contrast  Result Date: 05/24/2016 CLINICAL DATA:  Acute onset of encephalopathy.  Initial encounter. EXAM: CT HEAD WITHOUT CONTRAST TECHNIQUE: Contiguous axial images were obtained from the base of the skull through the vertex without  intravenous contrast. COMPARISON:  CT of the head performed 09/05/2013, and MRI of the brain performed 09/03/2013 FINDINGS: Brain: No evidence of acute infarction, hemorrhage, hydrocephalus, extra-axial collection  or mass lesion/mass effect. Chronic infarcts are noted at the cerebellar hemispheres bilaterally, with mild cerebellar atrophy. A chronic lacunar infarct is noted at the left basal ganglia, extending into the left corona radiata. Mild periventricular white matter change likely reflects small vessel ischemic microangiopathy. The brainstem and fourth ventricle are within normal limits. The cerebral hemispheres demonstrate grossly normal gray-white differentiation. No mass effect or midline shift is seen. Vascular: No hyperdense vessel or unexpected calcification. Skull: There is no evidence of fracture; visualized osseous structures are unremarkable in appearance. Sinuses/Orbits: The orbits are within normal limits. The paranasal sinuses and mastoid air cells are well-aerated. Other: No significant soft tissue abnormalities are seen. IMPRESSION: 1. No acute intracranial pathology seen on CT. 2. Chronic infarcts at the cerebellar hemispheres bilaterally, with mild cerebellar atrophy. 3. Chronic lacunar infarct at the left basal ganglia, extending into the left corona radiata. 4. Mild small vessel ischemic microangiopathy. Electronically Signed   By: Roanna Raider M.D.   On: 05/24/2016 21:43   Ct Chest Wo Contrast  Result Date: 05/02/2016 CLINICAL DATA:  Morbid obesity week chronic kidney disease and diabetes. Complains of weakness and fever over the past week. Cough. EXAM: CT CHEST WITHOUT CONTRAST TECHNIQUE: Multidetector CT imaging of the chest was performed following the standard protocol without IV contrast. COMPARISON:  Chest x-ray 05/01/2016 FINDINGS: Cardiovascular: Mild to moderate cardiomegaly. Remaining vascular structures are unremarkable. Mediastinum/Nodes: No definite mediastinal or hilar  adenopathy. Remaining mediastinal structures are unremarkable. Lungs/Pleura: Lungs are somewhat hypoinflated and demonstrate mild bibasilar linear atelectasis. Linear density over the posterior and anterior right middle lobe likely atelectasis. No evidence of effusion. Airways are unremarkable. Upper Abdomen: Mild calcified plaque over the celiac axis, splenic artery and superior mesenteric arteries. Musculoskeletal: Minimal degenerative change of the spine. Prominent overlying soft tissues. IMPRESSION: Linear density over the right middle lobe likely atelectasis, although cannot completely exclude infection. Minimal linear bibasilar atelectasis. Moderate cardiomegaly. Electronically Signed   By: Elberta Fortis M.D.   On: 05/02/2016 15:53   Nm Pulmonary Perf And Vent  Result Date: 05/06/2016 CLINICAL DATA:  Chest pain EXAM: NUCLEAR MEDICINE VENTILATION - PERFUSION LUNG SCAN VIEWS: Anterior, posterior, RAO, LAO, RPO, LPO-ventilation and profusion. RADIOPHARMACEUTICALS:  32.6 mCi Technetium-28m DTPA aerosol inhalation and 4.2 mCi Technetium-2m MAA IV COMPARISON:  Chest radiograph May 06, 2016 FINDINGS: Ventilation: Radiotracer uptake is homogeneous and symmetric. No ventilation defects are evident. Perfusion: Radiotracer uptake is homogeneous and symmetric bilaterally. No perfusion defects evident. Cardiomegaly noted. IMPRESSION: No appreciable ventilation or perfusion defects. This study constitutes a very low probability of pulmonary embolus. Cardiomegaly noted. Electronically Signed   By: Bretta Bang III M.D.   On: 05/06/2016 14:19   Ir Fluoro Guide Cv Line Right  Result Date: 05/07/2016 INDICATION: End-stage renal disease and starting hemodialysis. EXAM: FLUOROSCOPIC AND ULTRASOUND GUIDED PLACEMENT OF A TUNNELED DIALYSIS CATHETER Physician: Rachelle Hora. Lowella Dandy, MD MEDICATIONS: Ancef 3 g; The antibiotic was administered within an appropriate time interval prior to skin puncture. ANESTHESIA/SEDATION: Versed  2.0 mg IV; Fentanyl 100 mcg IV; Moderate Sedation Time:  20 minutes The patient was continuously monitored during the procedure by the interventional radiology nurse under my direct supervision. FLUOROSCOPY TIME:  Fluoroscopy Time: 24 seconds, 2.5 mGy COMPLICATIONS: None immediate. PROCEDURE: Informed consent was obtained for placement of a tunneled dialysis catheter. The patient was placed supine on the interventional table. Ultrasound confirmed a patent right internal jugular vein. Ultrasound images were obtained for documentation. The right side of the neck was prepped and draped  in a sterile fashion. The right side of the neck was anesthetized with 1% lidocaine. Maximal barrier sterile technique was utilized including caps, mask, sterile gowns, sterile gloves, sterile drape, hand hygiene and skin antiseptic. A small incision was made with #11 blade scalpel. A 21 gauge needle directed into the right internal jugular vein with ultrasound guidance. A micropuncture dilator set was placed. A 23 cm tip to cuff Palindrome catheter was selected. The skin below the right clavicle was anesthetized and a small incision was made with an #11 blade scalpel. A subcutaneous tunnel was formed to the vein dermatotomy site. The catheter was brought through the tunnel. The vein dermatotomy site was dilated to accommodate a peel-away sheath. The catheter was placed through the peel-away sheath and directed into the central venous structures. The tip of the catheter was placed at the superior cavoatrial junction with fluoroscopy. Fluoroscopic images were obtained for documentation. Both lumens were found to aspirate and flush well. The proper amount of heparin was flushed in both lumens. The vein dermatotomy site was closed using a single layer of absorbable suture and Dermabond. Gel-Foam placed in the subcutaneous tract. The catheter was secured to the skin using Prolene suture. IMPRESSION: Successful placement of a right jugular  tunneled dialysis catheter using ultrasound and fluoroscopic guidance. Electronically Signed   By: Richarda Overlie M.D.   On: 05/07/2016 14:33   Ir US Guide Vasc Access Right  Result Date: 05/07/2016 INDICATION: End-stage renal disease and starting hemodialysis. EXAM: FLUOROSCOPIC AND ULTRASOUND GUIDED PLACEMENT OF A TUNNELED DIALYSIS CATHETER Physician: Rachelle Hora. Lowella Dandy, MD MEDICATIONS: Ancef 3 g; The antibiotic was administered within an appropriate time interval prior to skin puncture. ANESTHESIA/SEDATION: Versed 2.0 mg IV; Fentanyl 100 mcg IV; Moderate Sedation Time:  20 minutes The patient was continuously monitored during the procedure by the interventional radiology nurse under my direct supervision. FLUOROSCOPY TIME:  Fluoroscopy Time: 24 seconds, 2.5 mGy COMPLICATIONS: None immediate. PROCEDURE: Informed consent was obtained for placement of a tunneled dialysis catheter. The patient was placed supine on the interventional table. Ultrasound confirmed a patent right internal jugular vein. Ultrasound images were obtained for documentation. The right side of the neck was prepped and draped in a sterile fashion. The right side of the neck was anesthetized with 1% lidocaine. Maximal barrier sterile technique was utilized including caps, mask, sterile gowns, sterile gloves, sterile drape, hand hygiene and skin antiseptic. A small incision was made with #11 blade scalpel. A 21 gauge needle directed into the right internal jugular vein with ultrasound guidance. A micropuncture dilator set was placed. A 23 cm tip to cuff Palindrome catheter was selected. The skin below the right clavicle was anesthetized and a small incision was made with an #11 blade scalpel. A subcutaneous tunnel was formed to the vein dermatotomy site. The catheter was brought through the tunnel. The vein dermatotomy site was dilated to accommodate a peel-away sheath. The catheter was placed through the peel-away sheath and directed into the central  venous structures. The tip of the catheter was placed at the superior cavoatrial junction with fluoroscopy. Fluoroscopic images were obtained for documentation. Both lumens were found to aspirate and flush well. The proper amount of heparin was flushed in both lumens. The vein dermatotomy site was closed using a single layer of absorbable suture and Dermabond. Gel-Foam placed in the subcutaneous tract. The catheter was secured to the skin using Prolene suture. IMPRESSION: Successful placement of a right jugular tunneled dialysis catheter using ultrasound and fluoroscopic guidance. Electronically  Signed   By: Richarda Overlie M.D.   On: 05/07/2016 14:33   Dg Chest Port 1 View  Result Date: 05/17/2016 CLINICAL DATA:  Dyspnea EXAM: PORTABLE CHEST 1 VIEW COMPARISON:  05/06/2016 FINDINGS: Right jugular central venous catheter in the mid SVC. Cardiac enlargement. Negative for edema or effusion. Lungs are clear. IMPRESSION: No active disease. Electronically Signed   By: Marlan Palau M.D.   On: 05/17/2016 08:53   Dg Chest Port 1 View  Result Date: 05/06/2016 CLINICAL DATA:  Acute onset of generalized chest pain. Initial encounter. EXAM: PORTABLE CHEST 1 VIEW COMPARISON:  Chest radiograph performed 05/01/2016, and CT of the chest performed 05/02/2016 FINDINGS: The lungs are hypoexpanded. Vascular congestion and vascular crowding are noted. Increased interstitial markings may reflect mild interstitial edema. There is no evidence of pleural effusion or pneumothorax. The cardiomediastinal silhouette is mildly enlarged. No acute osseous abnormalities are seen. IMPRESSION: Lungs hypoexpanded. Vascular congestion and mild cardiomegaly. Increased interstitial markings could reflect mild interstitial edema. Electronically Signed   By: Roanna Raider M.D.   On: 05/06/2016 02:48    Microbiology: No results found for this or any previous visit (from the past 240 hour(s)).   Labs: Basic Metabolic Panel:  Recent Labs Lab  05/27/16 0453 05/28/16 0449 05/29/16 0454 05/30/16 0431 05/31/16 0443  NA 139 138 133* 135 135  K 3.9 3.5 3.2* 3.7 5.2*  CL 95* 96* 95* 97* 96*  CO2 23 26 26 26 25   GLUCOSE 182* 178* 254* 220* 247*  BUN 61* 27* 43* 25* 44*  CREATININE 5.54* 3.37* 4.37* 2.65* 3.88*  CALCIUM 9.5 9.2 9.1 9.0 9.4  PHOS 4.9* 2.9 3.5 2.7 3.4   Liver Function Tests:  Recent Labs Lab 05/27/16 0453 05/28/16 0449 05/29/16 0454 05/30/16 0431 05/31/16 0443  ALBUMIN 1.9* 1.9* 1.8* 1.8* 1.9*   No results for input(s): LIPASE, AMYLASE in the last 168 hours. No results for input(s): AMMONIA in the last 168 hours. CBC:  Recent Labs Lab 05/27/16 0659 05/28/16 0449 05/29/16 0454 05/30/16 0431 05/31/16 0443  WBC 15.7* 15.1* 17.4* 16.3* 14.9*  HGB 8.8* 9.3* 8.3* 8.3* 9.3*  HCT 29.1* 30.8* 27.8* 28.2* 31.1*  MCV 89.3 89.8 90.0 90.1 90.1  PLT 322 328 340 324 363   Cardiac Enzymes: No results for input(s): CKTOTAL, CKMB, CKMBINDEX, TROPONINI in the last 168 hours. BNP: BNP (last 3 results)  Recent Labs  03/22/16 0603 03/24/16 0332 05/01/16 1218  BNP 646.0* 344.9* 207.5*    ProBNP (last 3 results) No results for input(s): PROBNP in the last 8760 hours.  CBG:  Recent Labs Lab 05/30/16 0754 05/30/16 1415 05/30/16 1635 05/30/16 2205 05/31/16 0922  GLUCAP 196* 214* 252* 237* 167*       Signed:  Meredeth Ide MD.  [facility] 05/31/2016, 11:17 AM

## 2016-05-31 NOTE — Progress Notes (Signed)
  [facility] Progress Note   Assessment/ Plan:   1. Acute encephalopathy- ? Component of delirium, depression, etc.  Was more responsive 4/14 according to notes  CT head negative 4/13.  Psych consulted, appreciate greatly- started Cymbalta 2. ESRD on HD MWF, providing HD on schedule.  Greatly appreciate nursing staff's efforts to get pt in chair.  K a little higher at 5.2 this am, ? Eating better.   3. Anemia: on ESA; ferritin > 3000, no IV iron 4. 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 Nutrition: albumin 1.9, on prostat 6. Hypertension: not on any meds at this time 7.  Thigh wounds- s/p debridement, wound vac in place, and on antibiotics (Zosyn--> Unasyn) 8.  Dispo: TBD   Subjective:    In the chair this AM!   Objective:   BP 126/81   Pulse (!) 124   Temp 98.8 F (37.1 C) (Oral)   Resp 18   Ht  (1.676 m)   Wt 135 kg (297 lb 9.9 oz) Comment: Weighed by Floor Staff  LMP 04/11/2016   SpO2 100%   BMI 48.04 kg/m   Physical Exam: Gen: sitting in chair, regards examiner but doesn't speak CVS:RRR Resp: nonlabored WJX:BJYNW Ext:L medial thigh wound + woundvac, tenderness improving.  LUE AVG in place Labs: BMET  Recent Labs Lab 05/25/16 0405 05/26/16 0517 05/27/16 0453 05/28/16 0449 05/29/16 0454 05/30/16 0431 05/31/16 0443  NA 135 138 139 138 133* 135 135  K 3.4* 3.5 3.9 3.5 3.2* 3.7 5.2*  CL 93* 96* 95* 96* 95* 97* 96*  CO2 GLUCOSE 178* 211* 182* 178* 254* 220* 247*  BUN 26* 46* 61* 27* 43* 25* 44*  CREATININE 3.20* 4.60* 5.54* 3.37* 4.37* 2.65* 3.88*  CALCIUM 9.0 9.1 9.5 9.2 9.1 9.0 9.4  PHOS 4.1 4.5 4.9* 2.9 3.5 2.7 3.4   CBC  Recent Labs Lab 05/28/16 0449 05/29/16 0454 05/30/16 0431 05/31/16 0443  WBC 15.1* 17.4* 16.3* 14.9*  HGB 9.3* 8.3* 8.3* 9.3*  HCT 30.8* 27.8* 28.2* 31.1*  MCV 89.8 90.0 90.1 90.1  PLT 328 340 324 363    @ Medications:    . acetaminophen  650 mg Oral Q6H  . albuterol  3  mL Inhalation Q M,W,F-HD  . atorvastatin  20 mg Oral Daily  . Darbepoetin Alfa      . darbepoetin (ARANESP) injection - DIALYSIS  150 mcg Intravenous Q Fri-HD  . DULoxetine  30 mg Oral Daily  . feeding supplement (NEPRO CARB STEADY)  237 mL Oral TID BM  . feeding supplement (PRO-STAT SUGAR FREE 64)  30 mL Oral TID  . heparin  5,000 Units Subcutaneous Q8H  . insulin aspart  0-5 Units Subcutaneous QHS  . insulin aspart  0-9 Units Subcutaneous TID WC  . insulin aspart  5 Units Subcutaneous TID WC  . insulin glargine  14 Units Subcutaneous QHS  . mouth rinse  15 mL Mouth Rinse BID  . multivitamin  1 tablet Oral QHS  . nystatin  5 mL Oral QID  . polyethylene glycol  17 g Oral BID  . senna-docusate  1 tablet Oral BID  . sevelamer carbonate  800 mg Oral TID WC     Bufford Buttner, MD 05/31/2016, 9:02 AM

## 2016-05-31 NOTE — Clinical Social Work Placement (Addendum)
   CLINICAL SOCIAL WORK PLACEMENT  NOTE 05/31/16 - DISCHARGED TO [facility]  Date:  05/31/2016  Patient Details  Name: [name] MRN: [id number] Date of Birth: 1975-04-10  Clinical Social Work is seeking post-discharge placement for this patient at the Skilled  Nursing Facility level of care (*CSW will initial, date and re-position this form in  chart as items are completed):  Yes   Patient/family provided with [facility] Social Work Department's list of facilities offering this level of care within the geographic area requested by the patient (or if unable, by the patient's family).  Yes   Patient/family informed of their freedom to choose among providers that offer the needed level of care, that participate in Medicare, Medicaid or managed care program needed by the patient, have an available bed and are willing to accept the patient.  Yes   Patient/family informed of [facility]'s ownership interest in [facility] and [facility], as well as of the fact that they are under no obligation to receive care at these facilities.  PASRR submitted to EDS on       PASRR number received on       Existing PASRR number confirmed on 05/03/16     FL2 transmitted to all facilities in geographic area requested by pt/family on 05/03/16     FL2 transmitted to all facilities within larger geographic area on       Patient informed that his/her managed care company has contracts with or will negotiate with certain facilities, including the following:         YES - Patient/family informed of bed offers received.  Patient chooses bed at  [facility]     Physician recommends and patient chooses bed at      Patient to be transferred to  [facility] on  05/31/16.  Patient to be transferred to facility by  ambulance     Attempted to notify family on  05/31/16 of transfer.  Name of family member notified:   Attempted to reach husband Derek Mound using cell  phone number [phone]), however woman who answered reported that phone number is hers and has been for over a year-she does not know [name]. Called home number given [phone]) and message left (name of patient not given).     PHYSICIAN Please sign FL2     Additional Comment:    _______________________________________________ Cristobal Goldmann, LCSW 05/31/2016, 4:49 PM

## 2016-06-07 ENCOUNTER — Encounter (HOSPITAL_COMMUNITY): Payer: Self-pay | Admitting: Emergency Medicine

## 2016-06-07 ENCOUNTER — Emergency Department (HOSPITAL_COMMUNITY): Payer: Medicaid Other

## 2016-06-07 ENCOUNTER — Inpatient Hospital Stay (HOSPITAL_COMMUNITY)
Admission: EM | Admit: 2016-06-07 | Discharge: 2016-08-11 | DRG: 040 | Disposition: E | Payer: Medicaid Other | Attending: Internal Medicine | Admitting: Internal Medicine

## 2016-06-07 DIAGNOSIS — J45909 Unspecified asthma, uncomplicated: Secondary | ICD-10-CM | POA: Diagnosis present

## 2016-06-07 DIAGNOSIS — E43 Unspecified severe protein-calorie malnutrition: Secondary | ICD-10-CM | POA: Diagnosis present

## 2016-06-07 DIAGNOSIS — I5032 Chronic diastolic (congestive) heart failure: Secondary | ICD-10-CM | POA: Diagnosis present

## 2016-06-07 DIAGNOSIS — E1165 Type 2 diabetes mellitus with hyperglycemia: Secondary | ICD-10-CM | POA: Diagnosis present

## 2016-06-07 DIAGNOSIS — G8929 Other chronic pain: Secondary | ICD-10-CM | POA: Diagnosis present

## 2016-06-07 DIAGNOSIS — M549 Dorsalgia, unspecified: Secondary | ICD-10-CM | POA: Diagnosis present

## 2016-06-07 DIAGNOSIS — K219 Gastro-esophageal reflux disease without esophagitis: Secondary | ICD-10-CM | POA: Diagnosis present

## 2016-06-07 DIAGNOSIS — R401 Stupor: Secondary | ICD-10-CM

## 2016-06-07 DIAGNOSIS — Z992 Dependence on renal dialysis: Secondary | ICD-10-CM

## 2016-06-07 DIAGNOSIS — Z6841 Body Mass Index (BMI) 40.0 and over, adult: Secondary | ICD-10-CM

## 2016-06-07 DIAGNOSIS — Z79899 Other long term (current) drug therapy: Secondary | ICD-10-CM

## 2016-06-07 DIAGNOSIS — R197 Diarrhea, unspecified: Secondary | ICD-10-CM

## 2016-06-07 DIAGNOSIS — N186 End stage renal disease: Secondary | ICD-10-CM

## 2016-06-07 DIAGNOSIS — A419 Sepsis, unspecified organism: Secondary | ICD-10-CM

## 2016-06-07 DIAGNOSIS — R4701 Aphasia: Secondary | ICD-10-CM | POA: Diagnosis present

## 2016-06-07 DIAGNOSIS — Z794 Long term (current) use of insulin: Secondary | ICD-10-CM

## 2016-06-07 DIAGNOSIS — N2581 Secondary hyperparathyroidism of renal origin: Secondary | ICD-10-CM | POA: Diagnosis present

## 2016-06-07 DIAGNOSIS — E1152 Type 2 diabetes mellitus with diabetic peripheral angiopathy with gangrene: Secondary | ICD-10-CM | POA: Diagnosis present

## 2016-06-07 DIAGNOSIS — Z515 Encounter for palliative care: Secondary | ICD-10-CM

## 2016-06-07 DIAGNOSIS — Z4659 Encounter for fitting and adjustment of other gastrointestinal appliance and device: Secondary | ICD-10-CM

## 2016-06-07 DIAGNOSIS — E1122 Type 2 diabetes mellitus with diabetic chronic kidney disease: Secondary | ICD-10-CM | POA: Diagnosis present

## 2016-06-07 DIAGNOSIS — R402422 Glasgow coma scale score 9-12, at arrival to emergency department: Secondary | ICD-10-CM | POA: Diagnosis present

## 2016-06-07 DIAGNOSIS — D631 Anemia in chronic kidney disease: Secondary | ICD-10-CM | POA: Diagnosis present

## 2016-06-07 DIAGNOSIS — R110 Nausea: Secondary | ICD-10-CM

## 2016-06-07 DIAGNOSIS — Z841 Family history of disorders of kidney and ureter: Secondary | ICD-10-CM

## 2016-06-07 DIAGNOSIS — R0602 Shortness of breath: Secondary | ICD-10-CM

## 2016-06-07 DIAGNOSIS — E1121 Type 2 diabetes mellitus with diabetic nephropathy: Secondary | ICD-10-CM | POA: Diagnosis present

## 2016-06-07 DIAGNOSIS — I639 Cerebral infarction, unspecified: Secondary | ICD-10-CM

## 2016-06-07 DIAGNOSIS — F259 Schizoaffective disorder, unspecified: Secondary | ICD-10-CM | POA: Diagnosis present

## 2016-06-07 DIAGNOSIS — E785 Hyperlipidemia, unspecified: Secondary | ICD-10-CM | POA: Diagnosis present

## 2016-06-07 DIAGNOSIS — L02416 Cutaneous abscess of left lower limb: Secondary | ICD-10-CM | POA: Diagnosis present

## 2016-06-07 DIAGNOSIS — E1151 Type 2 diabetes mellitus with diabetic peripheral angiopathy without gangrene: Secondary | ICD-10-CM | POA: Diagnosis present

## 2016-06-07 DIAGNOSIS — R1310 Dysphagia, unspecified: Secondary | ICD-10-CM

## 2016-06-07 DIAGNOSIS — I96 Gangrene, not elsewhere classified: Secondary | ICD-10-CM | POA: Diagnosis present

## 2016-06-07 DIAGNOSIS — L0291 Cutaneous abscess, unspecified: Secondary | ICD-10-CM

## 2016-06-07 DIAGNOSIS — IMO0002 Reserved for concepts with insufficient information to code with codable children: Secondary | ICD-10-CM

## 2016-06-07 DIAGNOSIS — R4182 Altered mental status, unspecified: Secondary | ICD-10-CM

## 2016-06-07 DIAGNOSIS — Z7401 Bed confinement status: Secondary | ICD-10-CM

## 2016-06-07 DIAGNOSIS — R7881 Bacteremia: Secondary | ICD-10-CM

## 2016-06-07 DIAGNOSIS — R58 Hemorrhage, not elsewhere classified: Secondary | ICD-10-CM | POA: Diagnosis not present

## 2016-06-07 DIAGNOSIS — I69351 Hemiplegia and hemiparesis following cerebral infarction affecting right dominant side: Secondary | ICD-10-CM

## 2016-06-07 DIAGNOSIS — E8889 Other specified metabolic disorders: Secondary | ICD-10-CM | POA: Diagnosis present

## 2016-06-07 DIAGNOSIS — L8990 Pressure ulcer of unspecified site, unspecified stage: Secondary | ICD-10-CM | POA: Diagnosis present

## 2016-06-07 DIAGNOSIS — A530 Latent syphilis, unspecified as early or late: Secondary | ICD-10-CM | POA: Diagnosis not present

## 2016-06-07 DIAGNOSIS — R0600 Dyspnea, unspecified: Secondary | ICD-10-CM

## 2016-06-07 DIAGNOSIS — I132 Hypertensive heart and chronic kidney disease with heart failure and with stage 5 chronic kidney disease, or end stage renal disease: Secondary | ICD-10-CM | POA: Diagnosis present

## 2016-06-07 DIAGNOSIS — F0631 Mood disorder due to known physiological condition with depressive features: Secondary | ICD-10-CM | POA: Diagnosis present

## 2016-06-07 DIAGNOSIS — I469 Cardiac arrest, cause unspecified: Secondary | ICD-10-CM | POA: Diagnosis not present

## 2016-06-07 DIAGNOSIS — G8194 Hemiplegia, unspecified affecting left nondominant side: Secondary | ICD-10-CM | POA: Diagnosis present

## 2016-06-07 DIAGNOSIS — I63512 Cerebral infarction due to unspecified occlusion or stenosis of left middle cerebral artery: Principal | ICD-10-CM | POA: Diagnosis present

## 2016-06-07 DIAGNOSIS — T82868A Thrombosis of vascular prosthetic devices, implants and grafts, initial encounter: Secondary | ICD-10-CM

## 2016-06-07 DIAGNOSIS — F329 Major depressive disorder, single episode, unspecified: Secondary | ICD-10-CM | POA: Diagnosis present

## 2016-06-07 DIAGNOSIS — I10 Essential (primary) hypertension: Secondary | ICD-10-CM | POA: Diagnosis present

## 2016-06-07 DIAGNOSIS — I6521 Occlusion and stenosis of right carotid artery: Secondary | ICD-10-CM

## 2016-06-07 DIAGNOSIS — R29737 NIHSS score 37: Secondary | ICD-10-CM | POA: Diagnosis present

## 2016-06-07 DIAGNOSIS — G9341 Metabolic encephalopathy: Secondary | ICD-10-CM | POA: Diagnosis present

## 2016-06-07 DIAGNOSIS — Z833 Family history of diabetes mellitus: Secondary | ICD-10-CM

## 2016-06-07 LAB — I-STAT ARTERIAL BLOOD GAS, ED
Acid-Base Excess: 1 mmol/L (ref 0.0–2.0)
Bicarbonate: 23.7 mmol/L (ref 20.0–28.0)
O2 Saturation: 97 %
Patient temperature: 97.6
TCO2: 25 mmol/L (ref 0–100)
pCO2 arterial: 29.7 mmHg — ABNORMAL LOW (ref 32.0–48.0)
pH, Arterial: 7.509 — ABNORMAL HIGH (ref 7.350–7.450)
pO2, Arterial: 77 mmHg — ABNORMAL LOW (ref 83.0–108.0)

## 2016-06-07 LAB — ETHANOL: Alcohol, Ethyl (B): <5 mg/dL (ref <5)

## 2016-06-07 LAB — CBC
HCT: 30.3 % — ABNORMAL LOW (ref 36.0–46.0)
Hemoglobin: 9.1 g/dL — ABNORMAL LOW (ref 12.0–15.0)
MCH: 27.3 pg (ref 26.0–34.0)
MCHC: 30 g/dL (ref 30.0–36.0)
MCV: 91 fL (ref 78.0–100.0)
Platelets: 406 10*3/uL — ABNORMAL HIGH (ref 150–400)
RBC: 3.33 MIL/uL — ABNORMAL LOW (ref 3.87–5.11)
RDW: 17.9 % — ABNORMAL HIGH (ref 11.5–15.5)
WBC: 20 10*3/uL — ABNORMAL HIGH (ref 4.0–10.5)

## 2016-06-07 LAB — COMPREHENSIVE METABOLIC PANEL
ALT: 30 U/L (ref 14–54)
AST: 28 U/L (ref 15–41)
Albumin: 2.1 g/dL — ABNORMAL LOW (ref 3.5–5.0)
Alkaline Phosphatase: 113 U/L (ref 38–126)
Anion gap: 16 — ABNORMAL HIGH (ref 5–15)
BUN: 17 mg/dL (ref 6–20)
CO2: 23 mmol/L (ref 22–32)
Calcium: 8.7 mg/dL — ABNORMAL LOW (ref 8.9–10.3)
Chloride: 97 mmol/L — ABNORMAL LOW (ref 101–111)
Creatinine, Ser: 2.37 mg/dL — ABNORMAL HIGH (ref 0.44–1.00)
GFR calc Af Amer: 29 mL/min — ABNORMAL LOW (ref >60)
GFR calc non Af Amer: 25 mL/min — ABNORMAL LOW (ref >60)
Glucose, Bld: 194 mg/dL — ABNORMAL HIGH (ref 65–99)
Potassium: 4.2 mmol/L (ref 3.5–5.1)
Sodium: 136 mmol/L (ref 135–145)
Total Bilirubin: 1 mg/dL (ref 0.3–1.2)
Total Protein: 8.5 g/dL — ABNORMAL HIGH (ref 6.5–8.1)

## 2016-06-07 LAB — DIFFERENTIAL
Basophils Absolute: 0 10*3/uL (ref 0.0–0.1)
Basophils Relative: 0 %
Eosinophils Absolute: 0 10*3/uL (ref 0.0–0.7)
Eosinophils Relative: 0 %
Lymphocytes Relative: 14 %
Lymphs Abs: 2.8 10*3/uL (ref 0.7–4.0)
Monocytes Absolute: 0.8 10*3/uL (ref 0.1–1.0)
Monocytes Relative: 4 %
Neutro Abs: 16.4 10*3/uL — ABNORMAL HIGH (ref 1.7–7.7)
Neutrophils Relative %: 82 %

## 2016-06-07 LAB — I-STAT CHEM 8, ED
BUN: 22 mg/dL — ABNORMAL HIGH (ref 6–20)
Calcium, Ion: 0.93 mmol/L — ABNORMAL LOW (ref 1.15–1.40)
Chloride: 99 mmol/L — ABNORMAL LOW (ref 101–111)
Creatinine, Ser: 2.2 mg/dL — ABNORMAL HIGH (ref 0.44–1.00)
Glucose, Bld: 195 mg/dL — ABNORMAL HIGH (ref 65–99)
HCT: 30 % — ABNORMAL LOW (ref 36.0–46.0)
Hemoglobin: 10.2 g/dL — ABNORMAL LOW (ref 12.0–15.0)
Potassium: 4.2 mmol/L (ref 3.5–5.1)
Sodium: 135 mmol/L (ref 135–145)
TCO2: 28 mmol/L (ref 0–100)

## 2016-06-07 LAB — I-STAT TROPONIN, ED: Troponin i, poc: 0.01 ng/mL (ref 0.00–0.08)

## 2016-06-07 LAB — CBG MONITORING, ED: Glucose-Capillary: 188 mg/dL — ABNORMAL HIGH (ref 65–99)

## 2016-06-07 LAB — AMMONIA: Ammonia: 31 umol/L (ref 9–35)

## 2016-06-07 NOTE — ED Notes (Signed)
Unable to  Follow directions  Failed her swallow screen

## 2016-06-07 NOTE — ED Notes (Signed)
Pt returned from ct

## 2016-06-07 NOTE — ED Notes (Signed)
Mri wants a chest xray before she comes there with noit much history

## 2016-06-07 NOTE — ED Notes (Signed)
The pt  Does not make urine anymore

## 2016-06-07 NOTE — ED Notes (Signed)
Pt to ct 

## 2016-06-07 NOTE — ED Notes (Signed)
To mri 

## 2016-06-07 NOTE — ED Provider Notes (Signed)
[facility]-EMERGENCY DEPT Provider Note   CSN: [id number] Arrival date & time: 05/25/2016  1719  By signing my name below, I, [name], attest that this documentation has been prepared under the direction and in the presence of Linda Razor, MD . Electronically Signed: Freida [name], Scribe. 06/05/2016. 5:36 PM.  History   Chief Complaint Chief Complaint  Patient presents with  . Altered Mental Status   LEVEL 5 CAVEAT DUE TO AMS The history is provided by the EMS personnel and medical records. No language interpreter was used.    HPI Comments:  [name] is a 41 y.o. obese female who presents to the Emergency Department via EMS for altered mental status. Per ED triage nurse pt is coming from dialysis where she had her full  treatment without issue but at the end her responsiveness significantly decreased. EMS reported blood sugars in the 140s and note difficulty getting reliable blood pressures en route. Pt has a h/o CVA, HTN, CHF, DM, CKD, and encephalopathy.   Past Medical History:  Diagnosis Date  . Anasarca 02/05/2015  . Asthma   . CHF (congestive heart failure) (HCC)    diastolic  . CVA (cerebral infarction) 1990's   "writing is not the same since" R leg weakness   . GERD (gastroesophageal reflux disease)   . Hypertension   . Morbid obesity (HCC)   . Palliative care encounter   . Stroke (HCC)   . Type II diabetes mellitus [facility])     Patient Active Problem List   Diagnosis Date Noted  . 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 Pressure injury of skin 05/27/2016  . ESRD (end stage renal disease) (HCC)   . Goals of care, counseling/discussion   . Palliative care by specialist   . Acute metabolic encephalopathy 05/04/2016  . Acute renal failure superimposed on stage 5 chronic kidney disease, not on chronic dialysis (HCC) 05/04/2016  . Acute respiratory failure with hypoxia (HCC) 05/04/2016  . Right middle lobe pneumonia (HCC) 05/04/2016  .  Leukocytosis 05/04/2016  . Uncontrolled diabetes mellitus with diabetic nephropathy, with long-term current use of insulin (HCC) 05/04/2016  . Dyslipidemia associated with type 2 diabetes mellitus (HCC) 05/04/2016  . Acute pulmonary edema (HCC)   . History of CVA with residual deficit   . Anemia of chronic disease   . Lymphedema   . Debility   . Essential hypertension   . Morbid obesity (HCC) 02/05/2015    Past Surgical History:  Procedure Laterality Date  . AV FISTULA PLACEMENT Left 05/14/2016   Procedure: ARTERIOVENOUS (AV) LEFT UPPER ARM USING GORETEX STRETCHED VASCULAR GRAFT;  Surgeon: Chuck Hint, MD;  Location: [facility] OR;  Service: Vascular;  Laterality: Left;  . INCISION AND DRAINAGE OF WOUND Left 08/30/2013   thigh necrotic wound/notes 08/30/2013  . IR GENERIC HISTORICAL  05/07/2016   IR FLUORO GUIDE CV LINE RIGHT 05/07/2016 Richarda Overlie, MD [facility]-INTERV RAD  . IR GENERIC HISTORICAL  05/07/2016   IR US GUIDE VASC ACCESS RIGHT 05/07/2016 Richarda Overlie, MD [facility]  . IRRIGATION AND DEBRIDEMENT ABSCESS Left 08/30/2013   Procedure: IRRIGATION AND DEBRIDEMENT ABSCESS Left Thigh Necrotic Wound;  Surgeon: [name], MD;  Location: [facility] OR;  Service: General;  Laterality: Left;  . TEE WITHOUT CARDIOVERSION N/A 09/07/2013   Procedure: TRANSESOPHAGEAL ECHOCARDIOGRAM (TEE);  Surgeon: [name], MD;  Location: [facility] ENDOSCOPY;  Service: Cardiovascular;  Laterality: N/A;  . TEE WITHOUT CARDIOVERSION N/A 02/08/2015   Procedure: TRANSESOPHAGEAL ECHOCARDIOGRAM (TEE);  Surgeon: Elmarie Shiley  Duke Salvia, MD;  Location: [facility] ENDOSCOPY;  Service: Cardiovascular;  Laterality: N/A;  . WOUND DEBRIDEMENT Left 05/21/2016   Procedure: DEBRIDEMENT LEFT THIGH WOUND;  Surgeon: Emelia Loron, MD;  Location: [facility] OR;  Service: General;  Laterality: Left;    OB History    Gravida Para Term Preterm AB Living   0 0 3   SAB TAB Ectopic Multiple Live Births   0 0 0 0 3       Home Medications    Prior to  Admission medications   Medication Sig Start Date End Date Taking? Authorizing Provider  albuterol (PROVENTIL) (2.5 MG/3ML) 0.083% nebulizer solution Take 3 mLs (2.5 mg total) by nebulization every 6 (six) hours as needed for wheezing or shortness of breath. 08/12/15   Charm Rings, MD  Amino Acids-Protein Hydrolys (FEEDING SUPPLEMENT, PRO-STAT SUGAR FREE 64,) LIQD Take 30 mLs by mouth 3 (three) times daily. 05/31/16   Meredeth Ide, MD  amoxicillin-clavulanate (AUGMENTIN) 500-125 MG tablet Take 1 tablet (500 mg total) by mouth at bedtime. 05/31/16 06/09/16  Meredeth Ide, MD  atorvastatin (LIPITOR) 20 MG tablet Take 20 mg by mouth daily.    Historical Provider, MD  camphor-menthol [facility]) lotion Apply 1 application topically every 8 (eight) hours as needed for itching. 03/30/16   Dron Jaynie Collins, MD  carvedilol (COREG) 25 MG tablet Take 1 tablet (25 mg total) by mouth 2 (two) times daily with a meal. 01/28/16   Eyvonne Mechanic, PA-C  DULoxetine (CYMBALTA) 30 MG capsule Take 1 capsule (30 mg total) by mouth daily. 05/31/16   Meredeth Ide, MD  insulin aspart (NOVOLOG) 100 UNIT/ML injection Inject 0-20 Units into the skin 3 (three) times daily with meals. CBG < 70: implement hypoglycemia protocol CBG 70 - 120: 0 units CBG 121 - 150: 3 units CBG 151 - 200: 4 units CBG 201 - 250: 7 units CBG 251 - 300: 11 units CBG 301 - 350: 15 units CBG 351 - 400: 20 units 01/28/16   Jeffrey Hedges, PA-C  insulin glargine (LANTUS) 100 UNIT/ML injection Inject 0.14 mLs (14 Units total) into the skin at bedtime. 05/31/16   Meredeth Ide, MD  multivitamin (RENA-VIT) TABS tablet Take 1 tablet by mouth at bedtime. 05/17/16   Renae Fickle, MD  nystatin (MYCOSTATIN) 100000 UNIT/ML suspension Take 5 mLs (500,000 Units total) by mouth 4 (four) times daily. 05/31/16 05/27/2016  Meredeth Ide, MD  senna-docusate (SENOKOT-S) 8.6-50 MG tablet Take 1 tablet by mouth 2 (two) times daily. 05/17/16   Renae Fickle, MD  sevelamer carbonate  (RENVELA) 800 MG tablet Take 1 tablet (800 mg total) by mouth 3 (three) times daily with meals. 05/31/16   Meredeth Ide, MD    Family History Family History  Problem Relation Age of Onset  . Diabetes Mother   . Kidney disease Mother     Social History Social History  Substance Use Topics  . Smoking status: Never Smoker  . Smokeless tobacco: Never Used  . Alcohol use No     Allergies   Azithromycin   Review of Systems Review of Systems  Unable to perform ROS: Mental status change    Physical Exam Updated Vital Signs BP (!) 138/108   Pulse (!) 106   Temp 97.6 F (36.4 C) (Temporal)   Resp (!) 28   SpO2 98%   Physical Exam  Constitutional: She appears well-developed and well-nourished.  HENT:  Head: Normocephalic and atraumatic.  Eyes: Conjunctivae  are normal.  Cardiovascular: Normal rate, regular rhythm and normal heart sounds.   Pulmonary/Chest: Effort normal.  Diminished breath sounds bilaterally   Abdominal: She exhibits no distension.  Neurological:  Eyes open wandering eye movement  dysconjugate gaze Will open eyes wider when name is called but does not fixate  Not following commands Withdraws RUE and BLE to pain; no movement of LUE  Skin: Skin is warm and dry.  Wound vac to left posterior thigh Dialysis cath right chest  Graft to LUE with palpable thrill   Nursing note and vitals reviewed.    ED Treatments / Results  DIAGNOSTIC STUDIES:  Oxygen Saturation is 100% on RA, normal by my interpretation.    Labs (all labs ordered are listed, but only abnormal results are displayed) Labs Reviewed  CBC - Abnormal; Notable for the following:       Result Value   WBC 20.0 (*)    RBC 3.33 (*)    Hemoglobin 9.1 (*)    HCT 30.3 (*)    RDW 17.9 (*)    Platelets 406 (*)    All other components within normal limits  DIFFERENTIAL - Abnormal; Notable for the following:    Neutro Abs 16.4 (*)    All other components within normal limits  COMPREHENSIVE  METABOLIC PANEL - Abnormal; Notable for the following:    Chloride 97 (*)    Glucose, Bld 194 (*)    Creatinine, Ser 2.37 (*)    Calcium 8.7 (*)    Total Protein 8.5 (*)    Albumin 2.1 (*)    GFR calc non Af Amer 25 (*)    GFR calc Af Amer 29 (*)    Anion gap 16 (*)    All other components within normal limits  CBG MONITORING, ED - Abnormal; Notable for the following:    Glucose-Capillary 188 (*)    All other components within normal limits  I-STAT CHEM 8, ED - Abnormal; Notable for the following:    Chloride 99 (*)    BUN 22 (*)    Creatinine, Ser 2.20 (*)    Glucose, Bld 195 (*)    Calcium, Ion 0.93 (*)    Hemoglobin 10.2 (*)    HCT 30.0 (*)    All other components within normal limits  I-STAT ARTERIAL BLOOD GAS, ED - Abnormal; Notable for the following:    pH, Arterial 7.509 (*)    pCO2 arterial 29.7 (*)    pO2, Arterial 77.0 (*)    All other components within normal limits  ETHANOL  AMMONIA  RAPID URINE DRUG SCREEN, HOSP PERFORMED  URINALYSIS, ROUTINE W REFLEX MICROSCOPIC  I-STAT TROPOININ, ED    EKG  EKG Interpretation  Date/Time:  Friday 12-Jun-2016 17:20:14 EDT Ventricular Rate:  103 PR Interval:    QRS Duration: 95 QT Interval:  383 QTC Calculation: 502 R Axis:   52 Text Interpretation:  Sinus tachycardia Probable left atrial enlargement LVH with secondary repolarization abnormality Borderline prolonged QT interval Confirmed by Juleen China  MD, Tyrece Vanterpool [id number]) on 06-12-2016 6:31:27 PM       Radiology Ct Head Wo Contrast  Result Date: 2016/06/12 CLINICAL DATA:  41 y/o  F; decreased mental status. EXAM: CT HEAD WITHOUT CONTRAST TECHNIQUE: Contiguous axial images were obtained from the base of the skull through the vertex without intravenous contrast. COMPARISON:  05/24/2016 and 09/05/2013 CT of the head. 09/03/2013 MRI brain. FINDINGS: Brain: Stable small chronic lacunar infarct within the right genu of internal  capsule, not well seen on prior CT, but present on  the 2015 CT and MRI. No evidence for large territory infarct, intracranial hemorrhage, hydrocephalus, or focal mass effect. Stable chronic lacunar infarct within left thalamus, left lentiform nucleus extending into corona radiata, and within bilateral cerebellar hemispheres. Vascular: Moderate calcific atherosclerosis of the cavernous and paraclinoid internal carotid arteries. Skull: Normal. Negative for fracture or focal lesion. Sinuses/Orbits: No acute finding. Other: None. IMPRESSION: 1. No acute intracranial abnormality identified. 2. Stable chronic basal ganglia and cerebellar hemisphere lacunar infarcts. Electronically Signed   By: Mitzi Hansen M.D.   On: 06/03/2016 20:10   Mr Maxine Glenn Head Wo Contrast  Result Date: 05/29/2016 CLINICAL DATA:  Altered mental status, spontaneously nonverbal at dialysis. Similar symptoms previously. History of stroke and RIGHT ICA occlusion, diabetes, end-stage renal disease on dialysis, hypertension. EXAM: MRI HEAD WITHOUT CONTRAST MRA HEAD WITHOUT CONTRAST TECHNIQUE: Multiplanar, multiecho pulse sequences of the brain and surrounding structures were obtained without intravenous contrast. Angiographic images of the head were obtained using MRA technique without contrast. COMPARISON:  CT HEAD June 07, 2016 at 1950 hours and MRI of the head September 03, 2013 and CT angiogram of the head and neck September 05, 2013. FINDINGS: Multiple sequences are moderately or severely motion degraded. MRI HEAD FINDINGS Approximately 6 subcentimeter foci of reduced diffusion RIGHT thalamus, RIGHT internal capsule and RIGHT hippocampus with low ADC values. Faint susceptibility artifact associated with old LEFT basal ganglia infarct. Multiple old cerebellar infarcts. Bold LEFT pontine infarcts, Atrophic midbrain. Mild LEFT cerebral peduncle wallerian degeneration. Old tiny RIGHT posterior frontal lobe infarct. Mild ex vacuo dilatation LEFT lateral ventricle, with generalized mild parenchymal  brain volume loss. No hydrocephalus. No midline shift, mass effect or masses. No abnormal extra-axial fluid collections. VASCULAR: See below. SKULL AND UPPER CERVICAL SPINE: No abnormal sellar expansion. No suspicious calvarial bone marrow signal. Craniocervical junction maintained. SINUSES/ORBITS: The mastoid air-cells and included paranasal sinuses are well-aerated. The included ocular globes and orbital contents are non-suspicious. OTHER: Patient is edentulous. MRA HEAD FINDINGS- moderately motion degraded examination resulting in duplicated appearance of the cerebral arteries. ANTERIOR CIRCULATION: Complete lack of flow related enhancement RIGHT internal carotid artery. LEFT internal carotid artery is patent. Stenotic proximal LEFT M1 segment better seen on prior CTA, anterior and LEFT middle cerebral arteries are patent. POSTERIOR CIRCULATION: No significant flow related enhancement LEFT vertebral artery. 2 mm inferiorly directed out patching vertebrobasilar junction. L patching No large vessel occlusion, high-grade stenosis, abnormal luminal irregularity, aneurysm. ANATOMIC VARIANTS: None. IMPRESSION: MRI HEAD: Motion degraded examination. 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 Multiple old infarcts. Moderate parenchymal brain volume loss for age. MRA HEAD: Moderately motion degraded examination. Slow flow versus occluded MCA, likely chronic given prior CT findings. Chronically occluded RIGHT internal carotid artery, and LEFT vertebral artery. Chronically stenotic proximal LEFT M1 segment, difficult to quantify. 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 Electronically Signed   By: Awilda Metro M.D.   On: 05/15/2016 23:49   Mr Brain Wo Contrast  Result Date: 05/30/2016 CLINICAL DATA:  Altered mental status, spontaneously nonverbal at dialysis. Similar symptoms previously. History of stroke and RIGHT ICA occlusion, diabetes, end-stage renal disease on  dialysis, hypertension. EXAM: MRI HEAD WITHOUT CONTRAST MRA HEAD WITHOUT CONTRAST TECHNIQUE: Multiplanar, multiecho pulse sequences of the brain and surrounding structures were obtained without intravenous contrast. Angiographic images of the head were obtained using MRA technique without contrast. COMPARISON:  CT HEAD June 07, 2016 at 1950 hours and MRI  of the head September 03, 2013 and CT angiogram of the head and neck September 05, 2013. FINDINGS: Multiple sequences are moderately or severely motion degraded. MRI HEAD FINDINGS Approximately 6 subcentimeter foci of reduced diffusion RIGHT thalamus, RIGHT internal capsule and RIGHT hippocampus with low ADC values. Faint susceptibility artifact associated with old LEFT basal ganglia infarct. Multiple old cerebellar infarcts. Bold LEFT pontine infarcts, Atrophic midbrain. Mild LEFT cerebral peduncle wallerian degeneration. Old tiny RIGHT posterior frontal lobe infarct. Mild ex vacuo dilatation LEFT lateral ventricle, with generalized mild parenchymal brain volume loss. No hydrocephalus. No midline shift, mass effect or masses. No abnormal extra-axial fluid collections. VASCULAR: See below. SKULL AND UPPER CERVICAL SPINE: No abnormal sellar expansion. No suspicious calvarial bone marrow signal. Craniocervical junction maintained. SINUSES/ORBITS: The mastoid air-cells and included paranasal sinuses are well-aerated. The included ocular globes and orbital contents are non-suspicious. OTHER: Patient is edentulous. MRA HEAD FINDINGS- moderately motion degraded examination resulting in duplicated appearance of the cerebral arteries. ANTERIOR CIRCULATION: Complete lack of flow related enhancement RIGHT internal carotid artery. LEFT internal carotid artery is patent. Stenotic proximal LEFT M1 segment better seen on prior CTA, anterior and LEFT middle cerebral arteries are patent. POSTERIOR CIRCULATION: No significant flow related enhancement LEFT vertebral artery. 2 mm inferiorly  directed out patching vertebrobasilar junction. L patching No large vessel occlusion, high-grade stenosis, abnormal luminal irregularity, aneurysm. ANATOMIC VARIANTS: None. IMPRESSION: MRI HEAD: Motion degraded examination. 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 Multiple old infarcts. Moderate parenchymal brain volume loss for age. MRA HEAD: Moderately motion degraded examination. Slow flow versus occluded MCA, likely chronic given prior CT findings. Chronically occluded RIGHT internal carotid artery, and LEFT vertebral artery. Chronically stenotic proximal LEFT M1 segment, difficult to quantify. 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 Electronically Signed   By: Awilda Metro M.D.   On: 05/17/2016 23:49   Dg Chest Portable 1 View  Result Date: 06/06/2016 CLINICAL DATA:  40 year old female for MRI clearance. EXAM: PORTABLE CHEST 1 VIEW COMPARISON:  Chest radiograph dated 05/17/2016 FINDINGS: Right IJ Udall dialysis catheter with tip at the cavoatrial junction. There is mild cardiomegaly. The lungs are clear. There is no pleural effusion or pneumothorax. No acute osseous pathology. No radiopaque metallic object identified. IMPRESSION: 1. No radiopaque metallic foreign object. 2. No acute cardiopulmonary process.  Mild cardiomegaly. 3. Right IJ dialysis catheter with tip at the cavoatrial junction. Electronically Signed   By: Elgie Collard M.D.   On: 05/31/2016 22:15    Procedures Procedures (including critical care time)   Medications Ordered in ED Medications - No data to display   Initial Impression / Assessment and Plan / ED Course  I have reviewed the triage vital signs and the nursing notes.  Pertinent labs & imaging results that were available during my care of the patient were reviewed by me and considered in my medical decision making (see chart for details).  5:50 PM Pt coming from [facility], [place].  Unfortunately, they close at 5p. Called and wasn't able to speak with anyone. EMS did not report LSN. Will proceed with stroke w/u, but will not activate as Code Stroke since LSN is not known.   MR with acute infarcts. Exam has improved, but still significant change from what I can glean from prior notes. Makes eye contacts. Will squeeze my hand with her R one. Still no movement on L.     Final Clinical Impressions(s) / ED Diagnoses   Final diagnoses:  Altered mental status, unspecified altered mental status type  Cerebrovascular accident (CVA), unspecified mechanism (HCC)    New Prescriptions New Prescriptions   No medications on file   I personally preformed the services scribed in my presence. The recorded information has been reviewed is accurate. Linda Razor, MD.     Linda Razor, MD 06/12/16 [phone]

## 2016-06-07 NOTE — ED Notes (Signed)
This is not a code stroke  The pt stopped talking at the end of dialysis no idea of the time  Ems did not know either and the dialysis center was closed when an attempt was made  To contact them.  According to her old records she has presented with these same symptoms previously  Unable to answer most of these questions

## 2016-06-07 NOTE — ED Notes (Signed)
The pt only opens up her eyes  No verbal  Response  She does make eye contact  Blanket given

## 2016-06-07 NOTE — ED Triage Notes (Signed)
Pt here from dialysis unit for aloc , according to EMS pt went unresponsive at the end of her treatment , pt arrived with a GCS of 11 , cbg 188

## 2016-06-07 NOTE — ED Notes (Signed)
pts eyes open and she makes eye contact no verbal response tracking with her eyes.  No voluntary movement of any body part  No family  arrival

## 2016-06-07 NOTE — ED Notes (Signed)
Mri will come get her shortly

## 2016-06-07 NOTE — ED Notes (Signed)
The pt has had a chest xray per request.  Ac has  Disconnected the wound  vac

## 2016-06-08 ENCOUNTER — Inpatient Hospital Stay (HOSPITAL_COMMUNITY): Payer: Medicaid Other

## 2016-06-08 DIAGNOSIS — T814XXD Infection following a procedure, subsequent encounter: Secondary | ICD-10-CM | POA: Diagnosis not present

## 2016-06-08 DIAGNOSIS — R401 Stupor: Secondary | ICD-10-CM | POA: Diagnosis not present

## 2016-06-08 DIAGNOSIS — I130 Hypertensive heart and chronic kidney disease with heart failure and stage 1 through stage 4 chronic kidney disease, or unspecified chronic kidney disease: Secondary | ICD-10-CM | POA: Diagnosis not present

## 2016-06-08 DIAGNOSIS — I6521 Occlusion and stenosis of right carotid artery: Secondary | ICD-10-CM | POA: Diagnosis not present

## 2016-06-08 DIAGNOSIS — G9341 Metabolic encephalopathy: Secondary | ICD-10-CM | POA: Diagnosis present

## 2016-06-08 DIAGNOSIS — I509 Heart failure, unspecified: Secondary | ICD-10-CM | POA: Diagnosis not present

## 2016-06-08 DIAGNOSIS — N186 End stage renal disease: Secondary | ICD-10-CM

## 2016-06-08 DIAGNOSIS — I639 Cerebral infarction, unspecified: Secondary | ICD-10-CM | POA: Diagnosis not present

## 2016-06-08 DIAGNOSIS — I5032 Chronic diastolic (congestive) heart failure: Secondary | ICD-10-CM | POA: Diagnosis present

## 2016-06-08 DIAGNOSIS — Z978 Presence of other specified devices: Secondary | ICD-10-CM | POA: Diagnosis not present

## 2016-06-08 DIAGNOSIS — L0291 Cutaneous abscess, unspecified: Secondary | ICD-10-CM | POA: Diagnosis not present

## 2016-06-08 DIAGNOSIS — R768 Other specified abnormal immunological findings in serum: Secondary | ICD-10-CM | POA: Diagnosis not present

## 2016-06-08 DIAGNOSIS — I6789 Other cerebrovascular disease: Secondary | ICD-10-CM

## 2016-06-08 DIAGNOSIS — E1122 Type 2 diabetes mellitus with diabetic chronic kidney disease: Secondary | ICD-10-CM | POA: Diagnosis present

## 2016-06-08 DIAGNOSIS — N2581 Secondary hyperparathyroidism of renal origin: Secondary | ICD-10-CM | POA: Diagnosis present

## 2016-06-08 DIAGNOSIS — F329 Major depressive disorder, single episode, unspecified: Secondary | ICD-10-CM | POA: Diagnosis present

## 2016-06-08 DIAGNOSIS — E1165 Type 2 diabetes mellitus with hyperglycemia: Secondary | ICD-10-CM

## 2016-06-08 DIAGNOSIS — I132 Hypertensive heart and chronic kidney disease with heart failure and with stage 5 chronic kidney disease, or end stage renal disease: Secondary | ICD-10-CM | POA: Diagnosis present

## 2016-06-08 DIAGNOSIS — R7881 Bacteremia: Secondary | ICD-10-CM | POA: Diagnosis not present

## 2016-06-08 DIAGNOSIS — I10 Essential (primary) hypertension: Secondary | ICD-10-CM

## 2016-06-08 DIAGNOSIS — Z833 Family history of diabetes mellitus: Secondary | ICD-10-CM | POA: Diagnosis not present

## 2016-06-08 DIAGNOSIS — E1321 Other specified diabetes mellitus with diabetic nephropathy: Secondary | ICD-10-CM | POA: Diagnosis not present

## 2016-06-08 DIAGNOSIS — R4182 Altered mental status, unspecified: Secondary | ICD-10-CM | POA: Diagnosis present

## 2016-06-08 DIAGNOSIS — I63331 Cerebral infarction due to thrombosis of right posterior cerebral artery: Secondary | ICD-10-CM | POA: Diagnosis not present

## 2016-06-08 DIAGNOSIS — E1121 Type 2 diabetes mellitus with diabetic nephropathy: Secondary | ICD-10-CM

## 2016-06-08 DIAGNOSIS — Z6841 Body Mass Index (BMI) 40.0 and over, adult: Secondary | ICD-10-CM | POA: Diagnosis not present

## 2016-06-08 DIAGNOSIS — I6300 Cerebral infarction due to thrombosis of unspecified precerebral artery: Secondary | ICD-10-CM | POA: Diagnosis not present

## 2016-06-08 DIAGNOSIS — I890 Lymphedema, not elsewhere classified: Secondary | ICD-10-CM | POA: Diagnosis not present

## 2016-06-08 DIAGNOSIS — Z515 Encounter for palliative care: Secondary | ICD-10-CM | POA: Diagnosis not present

## 2016-06-08 DIAGNOSIS — B9689 Other specified bacterial agents as the cause of diseases classified elsewhere: Secondary | ICD-10-CM | POA: Diagnosis not present

## 2016-06-08 DIAGNOSIS — F0631 Mood disorder due to known physiological condition with depressive features: Secondary | ICD-10-CM | POA: Diagnosis not present

## 2016-06-08 DIAGNOSIS — A419 Sepsis, unspecified organism: Secondary | ICD-10-CM | POA: Diagnosis not present

## 2016-06-08 DIAGNOSIS — K219 Gastro-esophageal reflux disease without esophagitis: Secondary | ICD-10-CM | POA: Diagnosis present

## 2016-06-08 DIAGNOSIS — L02416 Cutaneous abscess of left lower limb: Secondary | ICD-10-CM | POA: Diagnosis present

## 2016-06-08 DIAGNOSIS — D631 Anemia in chronic kidney disease: Secondary | ICD-10-CM | POA: Diagnosis present

## 2016-06-08 DIAGNOSIS — Z841 Family history of disorders of kidney and ureter: Secondary | ICD-10-CM | POA: Diagnosis not present

## 2016-06-08 DIAGNOSIS — I63019 Cerebral infarction due to thrombosis of unspecified vertebral artery: Secondary | ICD-10-CM | POA: Diagnosis not present

## 2016-06-08 DIAGNOSIS — E1152 Type 2 diabetes mellitus with diabetic peripheral angiopathy with gangrene: Secondary | ICD-10-CM | POA: Diagnosis present

## 2016-06-08 DIAGNOSIS — Z9889 Other specified postprocedural states: Secondary | ICD-10-CM | POA: Diagnosis not present

## 2016-06-08 DIAGNOSIS — R1310 Dysphagia, unspecified: Secondary | ICD-10-CM | POA: Diagnosis not present

## 2016-06-08 DIAGNOSIS — J45909 Unspecified asthma, uncomplicated: Secondary | ICD-10-CM | POA: Diagnosis present

## 2016-06-08 DIAGNOSIS — R1312 Dysphagia, oropharyngeal phase: Secondary | ICD-10-CM | POA: Diagnosis not present

## 2016-06-08 DIAGNOSIS — E1365 Other specified diabetes mellitus with hyperglycemia: Secondary | ICD-10-CM | POA: Diagnosis not present

## 2016-06-08 DIAGNOSIS — I638 Other cerebral infarction: Secondary | ICD-10-CM | POA: Diagnosis not present

## 2016-06-08 DIAGNOSIS — I63511 Cerebral infarction due to unspecified occlusion or stenosis of right middle cerebral artery: Secondary | ICD-10-CM | POA: Diagnosis not present

## 2016-06-08 DIAGNOSIS — G8194 Hemiplegia, unspecified affecting left nondominant side: Secondary | ICD-10-CM | POA: Diagnosis present

## 2016-06-08 DIAGNOSIS — Z992 Dependence on renal dialysis: Secondary | ICD-10-CM | POA: Diagnosis not present

## 2016-06-08 DIAGNOSIS — R197 Diarrhea, unspecified: Secondary | ICD-10-CM | POA: Diagnosis not present

## 2016-06-08 DIAGNOSIS — Z8673 Personal history of transient ischemic attack (TIA), and cerebral infarction without residual deficits: Secondary | ICD-10-CM | POA: Diagnosis not present

## 2016-06-08 DIAGNOSIS — Z794 Long term (current) use of insulin: Secondary | ICD-10-CM | POA: Diagnosis not present

## 2016-06-08 DIAGNOSIS — I69351 Hemiplegia and hemiparesis following cerebral infarction affecting right dominant side: Secondary | ICD-10-CM | POA: Diagnosis not present

## 2016-06-08 DIAGNOSIS — I6309 Cerebral infarction due to thrombosis of other precerebral artery: Secondary | ICD-10-CM | POA: Diagnosis not present

## 2016-06-08 DIAGNOSIS — R509 Fever, unspecified: Secondary | ICD-10-CM | POA: Diagnosis not present

## 2016-06-08 DIAGNOSIS — I96 Gangrene, not elsewhere classified: Secondary | ICD-10-CM | POA: Diagnosis present

## 2016-06-08 DIAGNOSIS — I330 Acute and subacute infective endocarditis: Secondary | ICD-10-CM | POA: Diagnosis not present

## 2016-06-08 DIAGNOSIS — I63512 Cerebral infarction due to unspecified occlusion or stenosis of left middle cerebral artery: Secondary | ICD-10-CM | POA: Diagnosis present

## 2016-06-08 DIAGNOSIS — E43 Unspecified severe protein-calorie malnutrition: Secondary | ICD-10-CM | POA: Diagnosis present

## 2016-06-08 LAB — GLUCOSE, CAPILLARY
GLUCOSE-CAPILLARY: 128 mg/dL — AB (ref 65–99)
GLUCOSE-CAPILLARY: 165 mg/dL — AB (ref 65–99)
GLUCOSE-CAPILLARY: 174 mg/dL — AB (ref 65–99)
Glucose-Capillary: 175 mg/dL — ABNORMAL HIGH (ref 65–99)
Glucose-Capillary: 152 mg/dL — ABNORMAL HIGH (ref 65–99)

## 2016-06-08 LAB — LACTIC ACID, PLASMA
LACTIC ACID, VENOUS: 2 mmol/L — AB (ref 0.5–1.9)
Lactic Acid, Venous: 1.5 mmol/L (ref 0.5–1.9)

## 2016-06-08 LAB — CBC WITH DIFFERENTIAL/PLATELET
Basophils Absolute: 0 10*3/uL (ref 0.0–0.1)
Basophils Relative: 0 %
EOS ABS: 0 10*3/uL (ref 0.0–0.7)
EOS PCT: 0 %
HCT: 29.6 % — ABNORMAL LOW (ref 36.0–46.0)
Hemoglobin: 8.9 g/dL — ABNORMAL LOW (ref 12.0–15.0)
LYMPHS ABS: 2.8 10*3/uL (ref 0.7–4.0)
LYMPHS PCT: 15 %
MCH: 27.1 pg (ref 26.0–34.0)
MCHC: 30.1 g/dL (ref 30.0–36.0)
MCV: 90.2 fL (ref 78.0–100.0)
MONO ABS: 1 10*3/uL (ref 0.1–1.0)
Monocytes Relative: 5 %
Neutro Abs: 14.4 10*3/uL — ABNORMAL HIGH (ref 1.7–7.7)
Neutrophils Relative %: 80 %
PLATELETS: 419 10*3/uL — AB (ref 150–400)
RBC: 3.28 MIL/uL — AB (ref 3.87–5.11)
RDW: 17.9 % — ABNORMAL HIGH (ref 11.5–15.5)
WBC: 18.1 10*3/uL — ABNORMAL HIGH (ref 4.0–10.5)

## 2016-06-08 LAB — BASIC METABOLIC PANEL
Anion gap: 17 — ABNORMAL HIGH (ref 5–15)
BUN: 26 mg/dL — AB (ref 6–20)
CHLORIDE: 96 mmol/L — AB (ref 101–111)
CO2: 23 mmol/L (ref 22–32)
CREATININE: 3.06 mg/dL — AB (ref 0.44–1.00)
Calcium: 9.4 mg/dL (ref 8.9–10.3)
GFR calc Af Amer: 21 mL/min — ABNORMAL LOW (ref >60)
GFR calc non Af Amer: 18 mL/min — ABNORMAL LOW (ref >60)
GLUCOSE: 176 mg/dL — AB (ref 65–99)
POTASSIUM: 4.1 mmol/L (ref 3.5–5.1)
SODIUM: 136 mmol/L (ref 135–145)

## 2016-06-08 LAB — ECHOCARDIOGRAM COMPLETE
HEIGHTINCHES: 62 in
WEIGHTICAEL: 3714.31 [oz_av]

## 2016-06-08 LAB — LIPID PANEL
Cholesterol: 160 mg/dL (ref 0–200)
HDL: 45 mg/dL (ref >40)
LDL Cholesterol: 86 mg/dL (ref 0–99)
Total CHOL/HDL Ratio: 3.6 RATIO
Triglycerides: 146 mg/dL (ref <150)
VLDL: 29 mg/dL (ref 0–40)

## 2016-06-08 LAB — FOLATE: FOLATE: 27.5 ng/mL (ref >5.9)

## 2016-06-08 LAB — VITAMIN B12: VITAMIN B 12: 1449 pg/mL — AB (ref 180–914)

## 2016-06-08 LAB — CBG MONITORING, ED: Glucose-Capillary: 170 mg/dL — ABNORMAL HIGH (ref 65–99)

## 2016-06-08 MED ORDER — ASPIRIN 325 MG PO TABS: 325.0000 mg | ORAL_TABLET | Freq: Every day | ORAL | Status: DC

## 2016-06-08 MED ORDER — INSULIN ASPART 100 UNIT/ML ~~LOC~~ SOLN
0.0000 [IU] | SUBCUTANEOUS | Status: DC
  Administered 2016-06-08 (×4): 2 [IU] via SUBCUTANEOUS
  Administered 2016-06-08: 1 [IU] via SUBCUTANEOUS
  Administered 2016-06-08 – 2016-06-09 (×2): 2 [IU] via SUBCUTANEOUS
  Administered 2016-06-09 (×3): 1 [IU] via SUBCUTANEOUS
  Administered 2016-06-09: 2 [IU] via SUBCUTANEOUS
  Administered 2016-06-10 (×3): 1 [IU] via SUBCUTANEOUS
  Administered 2016-06-11 (×4): 2 [IU] via SUBCUTANEOUS
  Administered 2016-06-11: 3 [IU] via SUBCUTANEOUS
  Administered 2016-06-12 (×4): 5 [IU] via SUBCUTANEOUS
  Administered 2016-06-13 (×4): 7 [IU] via SUBCUTANEOUS
  Administered 2016-06-13: 9 [IU] via SUBCUTANEOUS
  Administered 2016-06-14: 2 [IU] via SUBCUTANEOUS
  Administered 2016-06-14: 7 [IU] via SUBCUTANEOUS
  Administered 2016-06-14: 2 [IU] via SUBCUTANEOUS
  Administered 2016-06-14: 5 [IU] via SUBCUTANEOUS
  Administered 2016-06-14: 7 [IU] via SUBCUTANEOUS
  Administered 2016-06-14: 3 [IU] via SUBCUTANEOUS
  Administered 2016-06-15: 9 [IU] via SUBCUTANEOUS
  Administered 2016-06-15: 7 [IU] via SUBCUTANEOUS
  Administered 2016-06-15: 9 [IU] via SUBCUTANEOUS
  Administered 2016-06-15 – 2016-06-16 (×4): 7 [IU] via SUBCUTANEOUS
  Administered 2016-06-16: 5 [IU] via SUBCUTANEOUS
  Administered 2016-06-16: 3 [IU] via SUBCUTANEOUS
  Administered 2016-06-16: 9 [IU] via SUBCUTANEOUS
  Administered 2016-06-16: 5 [IU] via SUBCUTANEOUS
  Administered 2016-06-16: 3 [IU] via SUBCUTANEOUS
  Administered 2016-06-16: 7 [IU] via SUBCUTANEOUS
  Administered 2016-06-17 (×2): 3 [IU] via SUBCUTANEOUS
  Administered 2016-06-17: 2 [IU] via SUBCUTANEOUS
  Administered 2016-06-18 (×2): 9 [IU] via SUBCUTANEOUS
  Administered 2016-06-18: 7 [IU] via SUBCUTANEOUS
  Administered 2016-06-18 (×2): 5 [IU] via SUBCUTANEOUS
  Administered 2016-06-19 (×2): 3 [IU] via SUBCUTANEOUS
  Administered 2016-06-19: 2 [IU] via SUBCUTANEOUS
  Administered 2016-06-19: 3 [IU] via SUBCUTANEOUS
  Administered 2016-06-20: 2 [IU] via SUBCUTANEOUS
  Administered 2016-06-20: 1 [IU] via SUBCUTANEOUS
  Administered 2016-06-20: 3 [IU] via SUBCUTANEOUS
  Administered 2016-06-20 (×2): 2 [IU] via SUBCUTANEOUS
  Administered 2016-06-21 (×4): 1 [IU] via SUBCUTANEOUS
  Administered 2016-06-21: 2 [IU] via SUBCUTANEOUS
  Administered 2016-06-22: 7 [IU] via SUBCUTANEOUS
  Administered 2016-06-22: 5 [IU] via SUBCUTANEOUS
  Administered 2016-06-22 (×2): 3 [IU] via SUBCUTANEOUS
  Filled 2016-06-08: qty 1

## 2016-06-08 MED ORDER — ASPIRIN 300 MG RE SUPP
300.0000 mg | Freq: Every day | RECTAL | Status: DC
  Administered 2016-06-08 – 2016-06-14 (×7): 300 mg via RECTAL
  Filled 2016-06-08 (×7): qty 1

## 2016-06-08 MED ORDER — ACETAMINOPHEN 325 MG PO TABS: 650.0000 mg | ORAL_TABLET | ORAL | Status: DC | PRN

## 2016-06-08 MED ORDER — STROKE: EARLY STAGES OF RECOVERY BOOK
Freq: Once | Status: AC
  Administered 2016-06-08: 01:00:00
  Filled 2016-06-08: qty 1

## 2016-06-08 MED ORDER — ACETAMINOPHEN 160 MG/5ML PO SOLN: 650.0000 mg | ORAL | Status: DC | PRN

## 2016-06-08 MED ORDER — ORAL CARE MOUTH RINSE
15.0000 mL | Freq: Two times a day (BID) | OROMUCOSAL | Status: DC
  Administered 2016-06-09 – 2016-06-19 (×18): 15 mL via OROMUCOSAL

## 2016-06-08 MED ORDER — ACETAMINOPHEN 650 MG RE SUPP
650.0000 mg | RECTAL | Status: DC | PRN
  Administered 2016-06-08 – 2016-06-10 (×2): 650 mg via RECTAL
  Filled 2016-06-08 (×2): qty 1

## 2016-06-08 MED ORDER — CHLORHEXIDINE GLUCONATE 0.12 % MT SOLN
15.0000 mL | Freq: Two times a day (BID) | OROMUCOSAL | Status: DC
  Administered 2016-06-08 – 2016-06-14 (×13): 15 mL via OROMUCOSAL
  Filled 2016-06-08 (×9): qty 15

## 2016-06-08 NOTE — Progress Notes (Signed)
SLP Cancellation Note  Patient Details Name: [name] MRN: [id number] DOB: 1975/03/27   Cancelled treatment:       Reason Eval/Treat Not Completed: Patient's level of consciousness. Patient opened eyes when name called, then closed them and was not able to maintain alertness for evaluation.    Angela Nevin, MA, CCC-SLP 06/08/16 10:50 AM

## 2016-06-08 NOTE — H&P (Signed)
History and Physical    [name] ZOX:[id number] DOB: 1976-01-08 DOA: 05/19/2016  PCP: Marletta Lor, NP   Patient coming from: Dialysis center  Chief Complaint: Altered mental status  HPI: [name] is a 41 y.o. 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  It appears that she was discharged to SNF.  She has been dialyzing, presumably on MWF.  Upon completion of her HD session on the day of presentation, she was noted to have mental status changes, but reportedly, it was not clear to the personnel there as to when she was last known normal.  She was transferred to the ED for emergent evaluation.  Patient is unable to given any clinical history, and she is alone in the ED.  HPI taken for EMR and discussion with ED physician.  No answer at either number listed for her husband.  ED Course: She has a WBC count of 20.  No documented fever.  She is mildly tachycardic and tachypnic.  Hgb 9.  Platelet 406.  K 4.2, Cr 2.2, bicarb 28.  LFTs normal except low total protein and low albumin levels.  Ammonia level normal.  No urine sample obtained.  Troponin negative.  PH 7.5, pCO2 30, pO2 77.  No indication for ventilatory support at this time.  Head CT negative for acute process.  Chronic infarcts identified.  Chest xray shows mild cardiomegaly; otherwise, no acute process.  MRI/MRA of the head obtained in the ED and shows acute right deep gray nuclei and right hippocampal infarcts.  She has an occluded MCA, Right ICA, and Left vertebral artery.  Neurology has been consulted.  Hospitalist asked to admit.  Review of Systems: Unable to obtain due to altered mental status.   Past  Medical History:  Diagnosis Date  . Anasarca 02/05/2015  . Asthma   . CHF (congestive heart failure) (HCC)    diastolic  . CVA (cerebral infarction) 1990's   "writing is not the same since" R leg weakness   . GERD (gastroesophageal reflux disease)   . Hypertension   . Morbid obesity (HCC)   . Palliative care encounter   . Stroke (HCC)   . Type II diabetes mellitus (HCC)     Past Surgical History:  Procedure Laterality Date  . AV FISTULA PLACEMENT Left 05/14/2016   Procedure: ARTERIOVENOUS (AV) LEFT UPPER ARM USING GORETEX STRETCHED VASCULAR GRAFT;  Surgeon: Chuck Hint, MD;  Location: [facility] OR;  Service: Vascular;  Laterality: Left;  . INCISION AND DRAINAGE OF WOUND Left 08/30/2013   thigh necrotic wound/notes 08/30/2013  . IR GENERIC HISTORICAL  05/07/2016   IR FLUORO GUIDE CV LINE RIGHT 05/07/2016 Richarda Overlie, MD [facility]-INTER[facility] RAD  . IR GENERIC HISTORICAL  05/07/2016   IR US GUIDE VASC ACCESS RIGHT 05/07/2016 Richarda Overlie, MD [facility]  . IRRIGATION AND DEBRIDEMENT ABSCESS Left 08/30/2013   Procedure: IRRIGATION AND DEBRIDEMENT ABSCESS Left Thigh Necrotic Wound;  Surgeon: [name], MD;  Location: [facility] OR;  Service: General;  Laterality: Left;  . TEE WITHOUT CARDIOVERSION N/A 09/07/2013   Procedure: TRANSESOPHAGEAL ECHOCARDIOGRAM (TEE);  Surgeon: [name], MD;  Location[facility] ENDOSCOPY;  Service: Cardiovascular;  Laterality: N/A;  . TEE WITHOUT CARDIOVERSION N/A 02/08/2015   Procedure: TRANSESOPHAGEAL  ECHOCARDIOGRAM (TEE);  Surgeon: Chilton Si, MD;  Location: [facility] ENDOSCOPY;  Service: Cardiovascular;  Laterality: N/A;  . WOUND DEBRIDEMENT Left 05/21/2016   Procedure: DEBRIDEMENT LEFT THIGH WOUND;  Surgeon: Emelia Loron, MD;  Location: [facility] OR;  Service: General;  Laterality: Left;     reports that she has never smoked. She has never used smokeless tobacco. She reports that she does not drink alcohol or use drugs.  Allergies  Allergen Reactions  . Azithromycin Other  (See Comments)    Nose bleeding event    Family History  Problem Relation Age of Onset  . Diabetes Mother   . Kidney disease Mother      Prior to Admission medications   Medication Sig Start Date End Date Taking? Authorizing Provider  albuterol (PROVENTIL) (2.5 MG/3ML) 0.083% nebulizer solution Take 3 mLs (2.5 mg total) by nebulization every 6 (six) hours as needed for wheezing or shortness of breath. 08/12/15   Charm Rings, MD  Amino Acids-Protein Hydrolys (FEEDING SUPPLEMENT, PRO-STAT SUGAR FREE 64,) LIQD Take 30 mLs by mouth 3 (three) times daily. 05/31/16   Meredeth Ide, MD  amoxicillin-clavulanate (AUGMENTIN) 500-125 MG tablet Take 1 tablet (500 mg total) by mouth at bedtime. 05/31/16 06/09/16  Meredeth Ide, MD  atorvastatin (LIPITOR) 20 MG tablet Take 20 mg by mouth daily.    Historical Provider, MD  camphor-menthol [facility]) lotion Apply 1 application topically every 8 (eight) hours as needed for itching. 03/30/16   Dron Jaynie Collins, MD  carvedilol (COREG) 25 MG tablet Take 1 tablet (25 mg total) by mouth 2 (two) times daily with a meal. 01/28/16   Eyvonne Mechanic, PA-C  DULoxetine (CYMBALTA) 30 MG capsule Take 1 capsule (30 mg total) by mouth daily. 05/31/16   Meredeth Ide, MD  insulin aspart (NOVOLOG) 100 UNIT/ML injection Inject 0-20 Units into the skin 3 (three) times daily with meals. CBG < 70: implement hypoglycemia protocol CBG 70 - 120: 0 units CBG 121 - 150: 3 units CBG 151 - 200: 4 units CBG 201 - 250: 7 units CBG 251 - 300: 11 units CBG 301 - 350: 15 units CBG 351 - 400: 20 units 01/28/16   Jeffrey Hedges, PA-C  insulin glargine (LANTUS) 100 UNIT/ML injection Inject 0.14 mLs (14 Units total) into the skin at bedtime. 05/31/16   Meredeth Ide, MD  multivitamin (RENA-VIT) TABS tablet Take 1 tablet by mouth at bedtime. 05/17/16   Renae Fickle, MD  senna-docusate (SENOKOT-S) 8.6-50 MG tablet Take 1 tablet by mouth 2 (two) times daily. 05/17/16   Renae Fickle, MD  sevelamer  carbonate (RENVELA) 800 MG tablet Take 1 tablet (800 mg total) by mouth 3 (three) times daily with meals. 05/31/16   Meredeth Ide, MD    Physical Exam: Vitals:   Jun 15, 2016 2015 06-15-16 2100 Jun 15, 2016 2110 Jun 15, 2016 2115  BP: (!) 87/60 (!) 153/97  (!) 151/135  Pulse:  (!) 110  (!) 109  Resp: (!) 25 20  (!) 27  Temp:      TempSrc:      SpO2:  100% 98% 100%      Constitutional: NAD, obtunded with a right gaze preference Vitals:   2016-06-15 2015 06/15/2016 2100 2016/06/15 2110 2016-06-15 2115  BP: (!) 87/60 (!) 153/97  (!) 151/135  Pulse:  (!) 110  (!) 109  Resp: (!) 25 20  (!) 27  Temp:      TempSrc:      SpO2:  100% 98% 100%  Eyes: PERRL, lids and conjunctivae normal.  She can look to the right and left on command but seems to have more difficulty with looking to the left. ENMT: Mucous membranes are slightly dry. Posterior pharynx not visualized.  She is missing her teeth.  Neck: normal appearance, supple, thick, no masses Respiratory: diminished breath sound listening anteriorly.  No wheezing.  Normal respiratory effort. No accessory muscle use.  Cardiovascular: Tachycardic but regular.  No murmurs / rubs / gallops. No significant pitting in her lower extremities.   2+ pedal pulses. GI: abdomen is super obese but compressible.  No distention.  No apparent tenderness.  Bowel sounds are present. Musculoskeletal:  No joint deformity in upper and lower extremities. She cannot squeeze my fingers with her left hand.  She will squeeze on command with the right hand.  The will move both feet on command.  No contractures.  Decreased muscle tone in her LUE. Skin: Tattoos present. Thickened, hyperpigmented skin in both legs.  Wound vac present to left thigh.  Open wound to right calf. Neurologic: Limited by mental status changes.  Aphasic but she will follow some commands as noted above. Psychiatric: Flat affect, judgment impaired.  Disoriented.    Labs on Admission: I have personally reviewed  following labs and imaging studies  CBC:  Recent Labs Lab 06/06/2016 1745 05/16/2016 1804  WBC 20.0*  --   NEUTROABS 16.4*  --   HGB 9.1* 10.2*  HCT 30.3* 30.0*  MCV 91.0  --   PLT 406*  --    Basic Metabolic Panel:  Recent Labs Lab 06/05/2016 1745 05/27/2016 1804  NA 136 135  K 4.2 4.2  CL 97* 99*  CO2 23  --   GLUCOSE 194* 195*  BUN 17 22*  CREATININE 2.37* 2.20*  CALCIUM 8.7*  --    GFR: Estimated Creatinine Clearance: 48.6 mL/min (A) (by C-G formula based on SCr of 2.2 mg/dL (H)). Liver Function Tests:  Recent Labs Lab 05/15/2016 1745  AST 28  ALT 30  ALKPHOS 113  BILITOT 1.0  PROT 8.5*  ALBUMIN 2.1*   Recent Labs Lab 06/05/2016 1828  AMMONIA 31   CBG:  Recent Labs Lab 05/22/2016 1734  GLUCAP 188*   Radiological Exams on Admission: Ct Head Wo Contrast  Result Date: 05/17/2016 CLINICAL DATA:  41 y/o  F; decreased mental status. EXAM: CT HEAD WITHOUT CONTRAST TECHNIQUE: Contiguous axial images were obtained from the base of the skull through the vertex without intravenous contrast. COMPARISON:  05/24/2016 and 09/05/2013 CT of the head. 09/03/2013 MRI brain. FINDINGS: Brain: Stable small chronic lacunar infarct within the right genu of internal capsule, not well seen on prior CT, but present on the 2015 CT and MRI. No evidence for large territory infarct, intracranial hemorrhage, hydrocephalus, or focal mass effect. Stable chronic lacunar infarct within left thalamus, left lentiform nucleus extending into corona radiata, and within bilateral cerebellar hemispheres. Vascular: Moderate calcific atherosclerosis of the cavernous and paraclinoid internal carotid arteries. Skull: Normal. Negative for fracture or focal lesion. Sinuses/Orbits: No acute finding. Other: None. IMPRESSION: 1. No acute intracranial abnormality identified. 2. Stable chronic basal ganglia and cerebellar hemisphere lacunar infarcts. Electronically Signed   By: Mitzi Hansen M.D.   On:  06/06/2016 20:10   Mr Maxine Glenn Head Wo Contrast  Result Date: 05/18/2016 CLINICAL DATA:  Altered mental status, spontaneously nonverbal at dialysis. Similar symptoms previously. History of stroke and RIGHT ICA occlusion, diabetes, end-stage renal disease on dialysis, hypertension. EXAM: MRI HEAD WITHOUT CONTRAST  MRA HEAD WITHOUT CONTRAST TECHNIQUE: Multiplanar, multiecho pulse sequences of the brain and surrounding structures were obtained without intravenous contrast. Angiographic images of the head were obtained using MRA technique without contrast. COMPARISON:  CT HEAD June 07, 2016 at 1950 hours and MRI of the head September 03, 2013 and CT angiogram of the head and neck September 05, 2013. FINDINGS: Multiple sequences are moderately or severely motion degraded. MRI HEAD FINDINGS Approximately 6 subcentimeter foci of reduced diffusion RIGHT thalamus, RIGHT internal capsule and RIGHT hippocampus with low ADC values. Faint susceptibility artifact associated with old LEFT basal ganglia infarct. Multiple old cerebellar infarcts. Bold LEFT pontine infarcts, Atrophic midbrain. Mild LEFT cerebral peduncle wallerian degeneration. Old tiny RIGHT posterior frontal lobe infarct. Mild ex vacuo dilatation LEFT lateral ventricle, with generalized mild parenchymal brain volume loss. No hydrocephalus. No midline shift, mass effect or masses. No abnormal extra-axial fluid collections. VASCULAR: See below. SKULL AND UPPER CERVICAL SPINE: No abnormal sellar expansion. No suspicious calvarial bone marrow signal. Craniocervical junction maintained. SINUSES/ORBITS: The mastoid air-cells and included paranasal sinuses are well-aerated. The included ocular globes and orbital contents are non-suspicious. OTHER: Patient is edentulous. MRA HEAD FINDINGS- moderately motion degraded examination resulting in duplicated appearance of the cerebral arteries. ANTERIOR CIRCULATION: Complete lack of flow related enhancement RIGHT internal carotid artery.  LEFT internal carotid artery is patent. Stenotic proximal LEFT M1 segment better seen on prior CTA, anterior and LEFT middle cerebral arteries are patent. POSTERIOR CIRCULATION: No significant flow related enhancement LEFT vertebral artery. 2 mm inferiorly directed out patching vertebrobasilar junction. L patching No large vessel occlusion, high-grade stenosis, abnormal luminal irregularity, aneurysm. ANATOMIC VARIANTS: None. IMPRESSION: MRI HEAD: Motion degraded examination. 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 Multiple old infarcts. Moderate parenchymal brain volume loss for age. MRA HEAD: Moderately motion degraded examination. Slow flow versus occluded MCA, likely chronic given prior CT findings. Chronically occluded RIGHT internal carotid artery, and LEFT vertebral artery. Chronically stenotic proximal LEFT M1 segment, difficult to quantify. 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 Electronically Signed   By: Awilda Metro M.D.   On: 05/12/2016 23:49   Mr Brain Wo Contrast  Result Date: 05/22/2016 CLINICAL DATA:  Altered mental status, spontaneously nonverbal at dialysis. Similar symptoms previously. History of stroke and RIGHT ICA occlusion, diabetes, end-stage renal disease on dialysis, hypertension. EXAM: MRI HEAD WITHOUT CONTRAST MRA HEAD WITHOUT CONTRAST TECHNIQUE: Multiplanar, multiecho pulse sequences of the brain and surrounding structures were obtained without intravenous contrast. Angiographic images of the head were obtained using MRA technique without contrast. COMPARISON:  CT HEAD June 07, 2016 at 1950 hours and MRI of the head September 03, 2013 and CT angiogram of the head and neck September 05, 2013. FINDINGS: Multiple sequences are moderately or severely motion degraded. MRI HEAD FINDINGS Approximately 6 subcentimeter foci of reduced diffusion RIGHT thalamus, RIGHT internal capsule and RIGHT hippocampus with low ADC values. Faint susceptibility  artifact associated with old LEFT basal ganglia infarct. Multiple old cerebellar infarcts. Bold LEFT pontine infarcts, Atrophic midbrain. Mild LEFT cerebral peduncle wallerian degeneration. Old tiny RIGHT posterior frontal lobe infarct. Mild ex vacuo dilatation LEFT lateral ventricle, with generalized mild parenchymal brain volume loss. No hydrocephalus. No midline shift, mass effect or masses. No abnormal extra-axial fluid collections. VASCULAR: See below. SKULL AND UPPER CERVICAL SPINE: No abnormal sellar expansion. No suspicious calvarial bone marrow signal. Craniocervical junction maintained. SINUSES/ORBITS: The mastoid air-cells and included paranasal sinuses are well-aerated. The included ocular globes and orbital contents are  non-suspicious. OTHER: Patient is edentulous. MRA HEAD FINDINGS- moderately motion degraded examination resulting in duplicated appearance of the cerebral arteries. ANTERIOR CIRCULATION: Complete lack of flow related enhancement RIGHT internal carotid artery. LEFT internal carotid artery is patent. Stenotic proximal LEFT M1 segment better seen on prior CTA, anterior and LEFT middle cerebral arteries are patent. POSTERIOR CIRCULATION: No significant flow related enhancement LEFT vertebral artery. 2 mm inferiorly directed out patching vertebrobasilar junction. L patching No large vessel occlusion, high-grade stenosis, abnormal luminal irregularity, aneurysm. ANATOMIC VARIANTS: None. IMPRESSION: MRI HEAD: Motion degraded examination. 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 Multiple old infarcts. Moderate parenchymal brain volume loss for age. MRA HEAD: Moderately motion degraded examination. Slow flow versus occluded MCA, likely chronic given prior CT findings. Chronically occluded RIGHT internal carotid artery, and LEFT vertebral artery. Chronically stenotic proximal LEFT M1 segment, difficult to quantify. 2 mm probable aneurysm RIGHT vertebrobasilar  junction. Electronically Signed   By: Awilda Metro M.D.   On: 06/10/2016 23:49   Dg Chest Portable 1 View  Result Date: 05/30/2016 CLINICAL DATA:  41 year old female for MRI clearance. EXAM: PORTABLE CHEST 1 VIEW COMPARISON:  Chest radiograph dated 05/17/2016 FINDINGS: Right IJ Udall dialysis catheter with tip at the cavoatrial junction. There is mild cardiomegaly. The lungs are clear. There is no pleural effusion or pneumothorax. No acute osseous pathology. No radiopaque metallic object identified. IMPRESSION: 1. No radiopaque metallic foreign object. 2. No acute cardiopulmonary process.  Mild cardiomegaly. 3. Right IJ dialysis catheter with tip at the cavoatrial junction. Electronically Signed   By: Elgie Collard M.D.   On: 06/06/2016 22:15    EKG: Independently reviewed. Sinus tachycardia, LVH.  Assessment/Plan Principal Problem:   CVA (cerebral vascular accident) [facility]) Active Problems:   Morbid obesity (HCC)   Essential hypertension   Acute metabolic encephalopathy   Uncontrolled diabetes mellitus with diabetic nephropathy, with long-term current use of insulin (HCC)   ESRD (end stage renal disease) (HCC)   Depression due to physical illness      Acute, multifocal CVA with history of multiple prior CVAs causing acute metabolic encephalopathy --Admit to the stepdown unit --Neurology consult pending --NPO, she cannot swallow safely at this time --Aspirin per rectum --Will need to follow blood sugars; no maintenance fluids at this time since she is a dialysis patient with a history of CHF --Complete echo ordered --PT/OT/Speech consults when appropriate --Fasting lipid panel and A1c in the AM  Acute encephalopathy with tachycardia, tachypnea, and thigh wound --Will check a lactic acid level --Consider resuming empiric IV antibiotics if elevated --Hold on fluid boluses since she has ESRD --Wound care consult for wound vac management --May need general surgery to see again  at some point  Morbid obesity --Stable  HTN --Monitor closely in the setting of acute CVA  Diabetes --POC glucose testing with low dose sliding scale insulin q4h while NPO     DVT prophylaxis: SCDs Code Status: Presumed FULL Family Communication: Patient alone in the ED; no answer at either number listed for her husband on the facesheet. Disposition Plan: To be determined. Consults called: Neurology Admission status: Inpatient, stepdown unit.  I expect this patient will need inpatient services for greater than two midnights.   TIME SPENT: 60 minutes   Jerene Bears MD [facility] Pager [phone]  If 7PM-7AM, please contact night-coverage [email or web address] Password [facility]  06/08/2016, 12:43 AM

## 2016-06-08 NOTE — Progress Notes (Addendum)
[facility] MD text-paged to request wound VAC orders and/or WOC consult d/t pt having preexisting wound VAC and skin issues on arrival.  Addendum: MD placed orders for [facility] consult. 06/09/16 12:10 AM

## 2016-06-08 NOTE — ED Notes (Signed)
Io called [facility] to  Find out some information on this pt,  No family has shown up no one including the fascility has called about her.  The nurse is to  Call me back+

## 2016-06-08 NOTE — Progress Notes (Addendum)
Pt noted to have Wound VAC on arrival on left inner thigh. Apparently placed back in early April per chart review s/p debridement. Dressing not intact, no seal, so new supplies ordered to change it.

## 2016-06-08 NOTE — Consult Note (Signed)
Referring Physician: Dr. Thereasa Solo    Chief Complaint: Obtundation. Acute ischemic infarctions seen on MRI brain.   HPI: [name] is an 41 y.o. female who presented for further assessment of mental status changes occurring after dialysis on Friday. Per EMS, she became unresponsive at the end of her treatment. She arrived to the ED with a GCS of 11 and a CBG of 188. The personnel at the dialysis center were unsure of when she was last known normal.   In the ED her WBC count was noted to be 20. She had no fever but was mildly tachycardic and tachypneic. Ammonia level was normal. ABG and clinical parameters showed no indication for ventilatory support. CT head showed no acute changes, with atrophy and chronic infarctions noted.   MRI/MRA of the head was obtained in the ED, revealing a cluster of 3 small acute ischemic infarctions involving the deep grey nuclei and posterior hippocampus on the right. MRA showed slow flow versus occlusion of the right MCA which was felt to be chronic given prior CT findings. 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 The left M1 segment was noted to be chronically stenotic.   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 Also had a recent admission from 3/21 - 4/20 for encephalopathy.   LSN: Unknown tPA Given: No: Not a tPA candidate given no known time of neurological symptom onset  Past Medical History:  Diagnosis Date  . Anasarca 02/05/2015  . Asthma   . CHF (congestive heart failure) (HCC)    diastolic  . CVA (cerebral infarction) 1990's   "writing is not the same since" R leg weakness   . GERD (gastroesophageal reflux disease)   . Hypertension   . Morbid obesity ([place])   . Palliative care encounter   . Stroke ([place])   .  Type II diabetes mellitus ([place])     Past Surgical History:  Procedure Laterality Date  . AV FISTULA PLACEMENT Left 05/14/2016   Procedure: ARTERIOVENOUS (AV) LEFT UPPER ARM USING GORETEX STRETCHED VASCULAR GRAFT;  Surgeon: Angelia Mould, MD;  Location: [name];  Service: Vascular;  Laterality: Left;  . INCISION AND DRAINAGE OF WOUND Left 08/30/2013   thigh necrotic wound/notes 08/30/2013  . IR GENERIC HISTORICAL  05/07/2016   IR FLUORO GUIDE CV LINE RIGHT 05/07/2016 Markus Daft, MD [facility]  . IR GENERIC HISTORICAL  05/07/2016   IR US GUIDE VASC ACCESS RIGHT 05/07/2016 Markus Daft, MD [facility]  . IRRIGATION AND DEBRIDEMENT ABSCESS Left 08/30/2013   Procedure: IRRIGATION AND DEBRIDEMENT ABSCESS Left Thigh Necrotic Wound;  Surgeon: Ralene Ok, MD;  Location: [place];  Service: General;  Laterality: Left;  . TEE WITHOUT CARDIOVERSION N/A 09/07/2013   Procedure: TRANSESOPHAGEAL ECHOCARDIOGRAM (TEE);  Surgeon: Pixie Casino, MD;  Location: [facility] ENDOSCOPY;  Service: Cardiovascular;  Laterality: N/A;  . TEE WITHOUT CARDIOVERSION N/A 02/08/2015   Procedure: TRANSESOPHAGEAL ECHOCARDIOGRAM (TEE);  Surgeon: Skeet Latch, MD;  Location: [place];  Service: Cardiovascular;  Laterality: N/A;  . WOUND DEBRIDEMENT Left 05/21/2016   Procedure: DEBRIDEMENT LEFT THIGH WOUND;  Surgeon: Rolm Bookbinder, MD;  Location: [facility] OR;  Service: General;  Laterality: Left;    Family History  Problem Relation Age of Onset  . Diabetes Mother   . Kidney disease Mother  Social History:  reports that she has never smoked. She has never used smokeless tobacco. She reports that she does not drink alcohol or use drugs.  Allergies:  Allergies  Allergen Reactions  . Azithromycin Other (See Comments)    Nose bleeding event    Medications:  Prior to Admission:  Prescriptions Prior to Admission  Medication Sig Dispense Refill Last Dose  . albuterol (PROVENTIL) (2.5 MG/3ML) 0.083% nebulizer solution Take  3 mLs (2.5 mg total) by nebulization every 6 (six) hours as needed for wheezing or shortness of breath. 75 mL 0 04/30/2016 at Unknown time  . Amino Acids-Protein Hydrolys (FEEDING SUPPLEMENT, PRO-STAT SUGAR FREE 64,) LIQD Take 30 mLs by mouth 3 (three) times daily. 900 mL 0   . amoxicillin-clavulanate (AUGMENTIN) 500-125 MG tablet Take 1 tablet (500 mg total) by mouth at bedtime. 9 tablet 0   . atorvastatin (LIPITOR) 20 MG tablet Take 20 mg by mouth daily.   04/30/2016 at Unknown time  . camphor-menthol (SARNA) lotion Apply 1 application topically every 8 (eight) hours as needed for itching. 222 mL 0 unknown  . carvedilol (COREG) 25 MG tablet Take 1 tablet (25 mg total) by mouth 2 (two) times daily with a meal. 60 tablet 1 04/30/2016 at 1900  . DULoxetine (CYMBALTA) 30 MG capsule Take 1 capsule (30 mg total) by mouth daily. 30 capsule 3   . insulin aspart (NOVOLOG) 100 UNIT/ML injection Inject 0-20 Units into the skin 3 (three) times daily with meals. CBG < 70: implement hypoglycemia protocol CBG 70 - 120: 0 units CBG 121 - 150: 3 units CBG 151 - 200: 4 units CBG 201 - 250: 7 units CBG 251 - 300: 11 units CBG 301 - 350: 15 units CBG 351 - 400: 20 units 10 mL 0 Past Week at Unknown time  . insulin glargine (LANTUS) 100 UNIT/ML injection Inject 0.14 mLs (14 Units total) into the skin at bedtime. 10 mL 11   . multivitamin (RENA-VIT) TABS tablet Take 1 tablet by mouth at bedtime. 30 each 0   . senna-docusate (SENOKOT-S) 8.6-50 MG tablet Take 1 tablet by mouth 2 (two) times daily. 60 tablet 0   . sevelamer carbonate (RENVELA) 800 MG tablet Take 1 tablet (800 mg total) by mouth 3 (three) times daily with meals.      Scheduled: . aspirin  300 mg Rectal Daily   Or  . aspirin  325 mg Oral Daily  . insulin aspart  0-9 Units Subcutaneous Q4H    ROS: Unable to obtain ROS due to obtundation.   Physical Examination: Blood pressure (!) 99/59, pulse (!) 116, temperature 98 F (36.7 C), temperature source  Oral, resp. rate 15, height '5\' 2"'$  (1.575 m), weight 105.3 kg (232 lb 2.3 oz), SpO2 100 %.  HEENT: [place]/AT Abdomen: Morbidly obese Ext: Chronic trophic changes distal to knees bilaterally. Dressed wounds to thighs bilaterally.   Neurologic Examination: Mental Status: Obtunded. Opens eyes slowly to sternal rub and falls back asleep after approximately 5 seconds. Does not fixate on visual stimuli. Does not follow any commands. No volitional movement except for head turning away from noxious.  Cranial Nerves: II:  Blinks to threat bilaterally immediately after sternal rub. Pupils sluggishly reactive.  III,IV, VI: Eyes with mild exotropia and slow roving EOM. Slow roving EOM occur towards the side of noxious stimuli at times.  V,VII: Mildly decreased grimace on left side with noxious, on a somewhat flaccid background. Slowly reacts to brow ridge pressure bilaterally.  VIII:  no response to questions or commands IX,X: unable to visualize palate XI: turns head to left and right in response to noxious XII: does not follow command for tongue extension Motor/Sensory:  Mildly increased flexor tone RUE. Flickers fingers to noxious on right. Flaccid LUE tone. No movement to noxious. Bilateral lower extremities weakly withdraw to plantar stimulation with 2/5 strength, slightly more briskly on the right.  Deep Tendon Reflexes:  Right brachioradialis and biceps 2+. Left brachioradialis and biceps 1+. Absent patellar and achilles reflexes bilaterally.  Plantars: Equivocal responses bilaterally.  Cerebellar/Gait: Unable to assess.    Results for orders placed or performed during the hospital encounter of 05/20/2016 (from the past 48 hour(s))  CBG monitoring, ED     Status: Abnormal   Collection Time: 06/05/2016  5:34 PM  Result Value Ref Range   Glucose-Capillary 188 (H) 65 - 99 mg/dL  Ethanol     Status: None   Collection Time: 05/12/2016  5:35 PM  Result Value Ref Range   Alcohol, Ethyl (B) <5 <5 mg/dL     Comment:        LOWEST DETECTABLE LIMIT FOR SERUM ALCOHOL IS 5 mg/dL FOR MEDICAL PURPOSES ONLY   CBC     Status: Abnormal   Collection Time: 05/16/2016  5:45 PM  Result Value Ref Range   WBC 20.0 (H) 4.0 - 10.5 K/uL   RBC 3.33 (L) 3.87 - 5.11 MIL/uL   Hemoglobin 9.1 (L) 12.0 - 15.0 g/dL   HCT 30.3 (L) 36.0 - 46.0 %   MCV 91.0 78.0 - 100.0 fL   MCH 27.3 26.0 - 34.0 pg   MCHC 30.0 30.0 - 36.0 g/dL   RDW 17.9 (H) 11.5 - 15.5 %   Platelets 406 (H) 150 - 400 K/uL  Differential     Status: Abnormal   Collection Time: 06/09/2016  5:45 PM  Result Value Ref Range   Neutrophils Relative % 82 %   Lymphocytes Relative 14 %   Monocytes Relative 4 %   Eosinophils Relative 0 %   Basophils Relative 0 %   Neutro Abs 16.4 (H) 1.7 - 7.7 K/uL   Lymphs Abs 2.8 0.7 - 4.0 K/uL   Monocytes Absolute 0.8 0.1 - 1.0 K/uL   Eosinophils Absolute 0.0 0.0 - 0.7 K/uL   Basophils Absolute 0.0 0.0 - 0.1 K/uL   RBC Morphology POLYCHROMASIA PRESENT    WBC Morphology MILD LEFT SHIFT (1-5% METAS, OCC MYELO, OCC BANDS)   Comprehensive metabolic panel     Status: Abnormal   Collection Time: 05/22/2016  5:45 PM  Result Value Ref Range   Sodium 136 135 - 145 mmol/L   Potassium 4.2 3.5 - 5.1 mmol/L   Chloride 97 (L) 101 - 111 mmol/L   CO2 23 22 - 32 mmol/L   Glucose, Bld 194 (H) 65 - 99 mg/dL   BUN 17 6 - 20 mg/dL   Creatinine, Ser 2.37 (H) 0.44 - 1.00 mg/dL   Calcium 8.7 (L) 8.9 - 10.3 mg/dL   Total Protein 8.5 (H) 6.5 - 8.1 g/dL   Albumin 2.1 (L) 3.5 - 5.0 g/dL   AST 28 15 - 41 U/L   ALT 30 14 - 54 U/L   Alkaline Phosphatase 113 38 - 126 U/L   Total Bilirubin 1.0 0.3 - 1.2 mg/dL   GFR calc non Af Amer 25 (L) >60 mL/min   GFR calc Af Amer 29 (L) >60 mL/min    Comment: (NOTE) The eGFR has been calculated  using the CKD EPI equation. This calculation has not been validated in all clinical situations. eGFR's persistently <60 mL/min signify possible Chronic Kidney Disease.    Anion gap 16 (H) 5 - 15  I-stat  troponin, ED (not at [facility], [facility])     Status: None   Collection Time: 06/06/2016  6:02 PM  Result Value Ref Range   Troponin i, poc 0.01 0.00 - 0.08 ng/mL   Comment 3            Comment: Due to the release kinetics of cTnI, a negative result within the first hours of the onset of symptoms does not rule out myocardial infarction with certainty. If myocardial infarction is still suspected, repeat the test at appropriate intervals.   I-Stat Chem 8, ED  (not at [facility], [facility])     Status: Abnormal   Collection Time: 05/24/2016  6:04 PM  Result Value Ref Range   Sodium 135 135 - 145 mmol/L   Potassium 4.2 3.5 - 5.1 mmol/L   Chloride 99 (L) 101 - 111 mmol/L   BUN 22 (H) 6 - 20 mg/dL   Creatinine, Ser 2.20 (H) 0.44 - 1.00 mg/dL   Glucose, Bld 195 (H) 65 - 99 mg/dL   Calcium, Ion 0.93 (L) 1.15 - 1.40 mmol/L   TCO2 28 0 - 100 mmol/L   Hemoglobin 10.2 (L) 12.0 - 15.0 g/dL   HCT 30.0 (L) 36.0 - 46.0 %  Ammonia     Status: None   Collection Time: 06/03/2016  6:28 PM  Result Value Ref Range   Ammonia 31 9 - 35 umol/L  I-Stat Arterial Blood Gas, ED - (order at [facility] and [facility] only)     Status: Abnormal   Collection Time: 05/30/2016  6:30 PM  Result Value Ref Range   pH, Arterial 7.509 (H) 7.350 - 7.450   pCO2 arterial 29.7 (L) 32.0 - 48.0 mmHg   pO2, Arterial 77.0 (L) 83.0 - 108.0 mmHg   Bicarbonate 23.7 20.0 - 28.0 mmol/L   TCO2 25 0 - 100 mmol/L   O2 Saturation 97.0 %   Acid-Base Excess 1.0 0.0 - 2.0 mmol/L   Patient temperature 97.6 F    Collection site BRACHIAL ARTERY    Drawn by RT    Sample type ARTERIAL   Lactic acid, plasma     Status: Abnormal   Collection Time: 06/08/16 12:47 AM  Result Value Ref Range   Lactic Acid, Venous 2.0 (HH) 0.5 - 1.9 mmol/L    Comment: CRITICAL RESULT CALLED TO, READ BACK BY AND VERIFIED WITH: CHRISCO C,RN 06/08/16 0202 WAYK   CBG monitoring, ED     Status: Abnormal   Collection Time: 06/08/16  1:44 AM  Result Value Ref Range   Glucose-Capillary 170 (H) 65 - 99  mg/dL   Ct Head Wo Contrast  Result Date: 06/05/2016 CLINICAL DATA:  41 y/o  F; decreased mental status. EXAM: CT HEAD WITHOUT CONTRAST TECHNIQUE: Contiguous axial images were obtained from the base of the skull through the vertex without intravenous contrast. COMPARISON:  05/24/2016 and 09/05/2013 CT of the head. 09/03/2013 MRI brain. FINDINGS: Brain: Stable small chronic lacunar infarct within the right genu of internal capsule, not well seen on prior CT, but present on the 2015 CT and MRI. No evidence for large territory infarct, intracranial hemorrhage, hydrocephalus, or focal mass effect. Stable chronic lacunar infarct within left thalamus, left lentiform nucleus extending into corona radiata, and within bilateral cerebellar hemispheres. Vascular: Moderate calcific atherosclerosis of  the cavernous and paraclinoid internal carotid arteries. Skull: Normal. Negative for fracture or focal lesion. Sinuses/Orbits: No acute finding. Other: None. IMPRESSION: 1. No acute intracranial abnormality identified. 2. Stable chronic basal ganglia and cerebellar hemisphere lacunar infarcts. Electronically Signed   By: Kristine Garbe M.D.   On: 05/22/2016 20:10   Mr Jodene Nam Head Wo Contrast  Result Date: 05/28/2016 CLINICAL DATA:  Altered mental status, spontaneously nonverbal at dialysis. Similar symptoms previously. History of stroke and RIGHT ICA occlusion, diabetes, end-stage renal disease on dialysis, hypertension. EXAM: MRI HEAD WITHOUT CONTRAST MRA HEAD WITHOUT CONTRAST TECHNIQUE: Multiplanar, multiecho pulse sequences of the brain and surrounding structures were obtained without intravenous contrast. Angiographic images of the head were obtained using MRA technique without contrast. COMPARISON:  CT HEAD June 07, 2016 at 1950 hours and MRI of the head September 03, 2013 and CT angiogram of the head and neck September 05, 2013. FINDINGS: Multiple sequences are moderately or severely motion degraded. MRI HEAD FINDINGS  Approximately 6 subcentimeter foci of reduced diffusion RIGHT thalamus, RIGHT internal capsule and RIGHT hippocampus with low ADC values. Faint susceptibility artifact associated with old LEFT basal ganglia infarct. Multiple old cerebellar infarcts. Bold LEFT pontine infarcts, Atrophic midbrain. Mild LEFT cerebral peduncle wallerian degeneration. Old tiny RIGHT posterior frontal lobe infarct. Mild ex vacuo dilatation LEFT lateral ventricle, with generalized mild parenchymal brain volume loss. No hydrocephalus. No midline shift, mass effect or masses. No abnormal extra-axial fluid collections. VASCULAR: See below. SKULL AND UPPER CERVICAL SPINE: No abnormal sellar expansion. No suspicious calvarial bone marrow signal. Craniocervical junction maintained. SINUSES/ORBITS: The mastoid air-cells and included paranasal sinuses are well-aerated. The included ocular globes and orbital contents are non-suspicious. OTHER: Patient is edentulous. MRA HEAD FINDINGS- moderately motion degraded examination resulting in duplicated appearance of the cerebral arteries. ANTERIOR CIRCULATION: Complete lack of flow related enhancement RIGHT internal carotid artery. LEFT internal carotid artery is patent. Stenotic proximal LEFT M1 segment better seen on prior CTA, anterior and LEFT middle cerebral arteries are patent. POSTERIOR CIRCULATION: No significant flow related enhancement LEFT vertebral artery. 2 mm inferiorly directed out patching vertebrobasilar junction. L patching No large vessel occlusion, high-grade stenosis, abnormal luminal irregularity, aneurysm. ANATOMIC VARIANTS: None. IMPRESSION: MRI HEAD: Motion degraded examination. 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 Multiple old infarcts. Moderate parenchymal brain volume loss for age. MRA HEAD: Moderately motion degraded examination. Slow flow versus occluded MCA, likely chronic given prior CT findings. Chronically occluded RIGHT internal  carotid artery, and LEFT vertebral artery. Chronically stenotic proximal LEFT M1 segment, difficult to quantify. 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 Electronically Signed   By: Elon Alas M.D.   On: 05/25/2016 23:49   Mr Brain Wo Contrast  Result Date: 06/08/2016 CLINICAL DATA:  Altered mental status, spontaneously nonverbal at dialysis. Similar symptoms previously. History of stroke and RIGHT ICA occlusion, diabetes, end-stage renal disease on dialysis, hypertension. EXAM: MRI HEAD WITHOUT CONTRAST MRA HEAD WITHOUT CONTRAST TECHNIQUE: Multiplanar, multiecho pulse sequences of the brain and surrounding structures were obtained without intravenous contrast. Angiographic images of the head were obtained using MRA technique without contrast. COMPARISON:  CT HEAD June 07, 2016 at 1950 hours and MRI of the head September 03, 2013 and CT angiogram of the head and neck September 05, 2013. FINDINGS: Multiple sequences are moderately or severely motion degraded. MRI HEAD FINDINGS Approximately 6 subcentimeter foci of reduced diffusion RIGHT thalamus, RIGHT internal capsule and RIGHT hippocampus with low ADC values. Faint susceptibility artifact associated with  old LEFT basal ganglia infarct. Multiple old cerebellar infarcts. Bold LEFT pontine infarcts, Atrophic midbrain. Mild LEFT cerebral peduncle wallerian degeneration. Old tiny RIGHT posterior frontal lobe infarct. Mild ex vacuo dilatation LEFT lateral ventricle, with generalized mild parenchymal brain volume loss. No hydrocephalus. No midline shift, mass effect or masses. No abnormal extra-axial fluid collections. VASCULAR: See below. SKULL AND UPPER CERVICAL SPINE: No abnormal sellar expansion. No suspicious calvarial bone marrow signal. Craniocervical junction maintained. SINUSES/ORBITS: The mastoid air-cells and included paranasal sinuses are well-aerated. The included ocular globes and orbital contents are non-suspicious. OTHER: Patient is  edentulous. MRA HEAD FINDINGS- moderately motion degraded examination resulting in duplicated appearance of the cerebral arteries. ANTERIOR CIRCULATION: Complete lack of flow related enhancement RIGHT internal carotid artery. LEFT internal carotid artery is patent. Stenotic proximal LEFT M1 segment better seen on prior CTA, anterior and LEFT middle cerebral arteries are patent. POSTERIOR CIRCULATION: No significant flow related enhancement LEFT vertebral artery. 2 mm inferiorly directed out patching vertebrobasilar junction. L patching No large vessel occlusion, high-grade stenosis, abnormal luminal irregularity, aneurysm. ANATOMIC VARIANTS: None. IMPRESSION: MRI HEAD: Motion degraded examination. 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 Multiple old infarcts. Moderate parenchymal brain volume loss for age. MRA HEAD: Moderately motion degraded examination. Slow flow versus occluded MCA, likely chronic given prior CT findings. Chronically occluded RIGHT internal carotid artery, and LEFT vertebral artery. Chronically stenotic proximal LEFT M1 segment, difficult to quantify. 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 Electronically Signed   By: Elon Alas M.D.   On: 06/03/2016 23:49   Dg Chest Portable 1 View  Result Date: 05/23/2016 CLINICAL DATA:  41 year old female for MRI clearance. EXAM: PORTABLE CHEST 1 VIEW COMPARISON:  Chest radiograph dated 05/17/2016 FINDINGS: Right IJ Udall dialysis catheter with tip at the cavoatrial junction. There is mild cardiomegaly. The lungs are clear. There is no pleural effusion or pneumothorax. No acute osseous pathology. No radiopaque metallic object identified. IMPRESSION: 1. No radiopaque metallic foreign object. 2. No acute cardiopulmonary process.  Mild cardiomegaly. 3. Right IJ dialysis catheter with tip at the cavoatrial junction. Electronically Signed   By: Anner Crete M.D.   On: 05/30/2016 22:15     Assessment: 41 y.o. female with confusion noted at dialysis facility. 1. Examination reveals LUE weakness and less brisk responses to noxious stimuli applied to LLE relative to right. These findings are most likely secondary to the small acute infarctions involving the deep grey nuclei on the right, as seen on MRI brain. The infarctions may be secondary to an unusual manifestation of a watershed phenomenon given the chronic right ICA occlusion seen on imaging. Slow flow versus occlusion of the right MCA is also noted, likely chronic given prior CT findings. 2. Obtundation not likely to be secondary to the acute infarctions. DDx includes septic encephalopathy, sequelae of possible hypoxia during dialysis, and metabolic encephalopathy. No nuchal rigidity seen on exam.  3. Multiple old infarctions and brain parenchymal volume loss are also seen on MRI. Most likely resulting in decreased neurological reserve and increased susceptibility to metabolic, toxic or infectious insults.  4. Chronically occluded left vertebral artery. 5. Chronically stenotic proximal left M1 segment. 6. Clinical history of prior stroke. A chronic focus of encephalomalacia within the periventricular white matter in her left hemisphere which is seen on my review of the CT images may be the imaging sequela of this, as exam shows increased tone and relatively increased reflexes on the right.  7. Stroke Risk Factors - priior  stroke, DM2, morbid obesity, HTN and chronic diastolic HF 8. Leukocytosis with mild left shift and elevated lactate. DDx includes infection. Wounds seen on exam are a likely source, with bacteremia or sepsis a possibility.  9. Hypotension.   Plan: 1. Echocardiogram 2. Carotid dopplers 3. Agree with starting ASA. 4. Telemetry monitoring 5. Frequent neuro checks 6. EEG to evaluate for possible metabolic encephalopathy.   '@Electronically'$  signed: Dr. Kerney Elbe  06/08/2016, 4:57 AM

## 2016-06-08 NOTE — Progress Notes (Signed)
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  She was discharged to a SNF.  She had been dialyzing, presumably on MWF.  Upon completion of her HD session on the day of presentation, she was noted to have mental status changes.  It was not clear to the personnel when she was last known normal.  She was transferred to the ED where she had a WBC count of 20, Hgb 9, Ammonia level normal.  Head CT was negative for acute process.  MRI/MRA noted acute right deep gray nuclei and right hippocampal infarcts with an occluded R MCA, Right ICA, and Left vertebral artery.  Subjective: Pt is seen for a f/u visit.    Assessment & Plan:  Acute, multifocal CVA with history of multiple prior CVAs  Acute metabolic encephalopathy  ESRD on HD  Morbid obesity  HTN  DM  Chronic diastolic congestive heart failure  DVT prophylaxis: SCDs Code Status: FULL CODE Family Communication: no family present at time of exam  Disposition Plan: SDU  Consultants:  Neurology   Procedures: none  Antimicrobials:  none   Objective: Blood pressure 103/80, pulse (!) 116, temperature 98 F (36.7 C), temperature source Oral, resp. rate 16, height  (1.575 m), weight 105.3 kg (232 lb 2.3 oz), SpO2 99 %.  Intake/Output Summary (Last 24 hours) at 06/08/16 1034 Last data filed at 06/08/16 0900  Gross per 24 hour  Intake                0 ml  Output                0 ml  Net                0 ml   Filed Weights   06/08/16 0421  Weight: 105.3 kg (232 lb 2.3 oz)    Examination: Pt was seen for a f/u visit.    CBC:  Recent  Labs Lab 05/22/2016 1745 05/25/2016 1804 06/08/16 0448  WBC 20.0*  --  18.1*  NEUTROABS 16.4*  --  14.4*  HGB 9.1* 10.2* 8.9*  HCT 30.3* 30.0* 29.6*  MCV 91.0  --  90.2  PLT 406*  --  419*   Basic Metabolic Panel:  Recent Labs Lab 05/12/2016 1745 06/03/2016 1804 06/08/16 0448  NA 136 135 136  K 4.2 4.2 4.1  CL 97* 99* 96*  CO2 23  --  23  GLUCOSE 194* 195* 176*  BUN 17 22* 26*  CREATININE 2.37* 2.20* 3.06*  CALCIUM 8.7*  --  9.4   GFR: Estimated Creatinine Clearance: 28.1 mL/min (A) (by C-G formula based on SCr of 3.06 mg/dL (H)).  Liver Function Tests:  Recent Labs Lab 05/23/2016 1745  AST 28  ALT 30  ALKPHOS 113  BILITOT 1.0  PROT 8.5*  ALBUMIN 2.1*    Recent Labs Lab 05/16/2016 1828  AMMONIA 31    HbA1C: Hgb A1c MFr Bld  Date/Time Value Ref Range Status  03/09/2016 01:52 AM 5.2 4.8 - 5.6 % Final    Comment:    (NOTE)         Pre-diabetes: 5.7 -  6.4         Diabetes: >6.4         Glycemic control for adults with diabetes: <7.0   02/05/2015 04:13 AM 7.8 (H) 4.8 - 5.6 % Final    Comment:    (NOTE)         Pre-diabetes: 5.7 - 6.4         Diabetes: >6.4         Glycemic control for adults with diabetes: <7.0     CBG:  Recent Labs Lab 06/01/2016 1734 06/08/16 0144 06/08/16 0755  GLUCAP 188* 170* 174*    Scheduled Meds: . aspirin  300 mg Rectal Daily   Or  . aspirin  325 mg Oral Daily  . insulin aspart  0-9 Units Subcutaneous Q4H     LOS: 0 days   Time spent: No Charge  Lonia Blood, MD [facility] Office  [phone] Pager - Text Page per Amion as per below:  On-Call/Text Page:      [email or web address]      password [email or web address]  If 7PM-7AM, please contact night-coverage [email or web address] Password [facility] 06/08/2016, 10:34 AM

## 2016-06-08 NOTE — Progress Notes (Signed)
  Echocardiogram 2D Echocardiogram has been performed.  Dorothey Baseman 06/08/2016, 3:39 PM

## 2016-06-08 NOTE — ED Notes (Signed)
Report  Called to 2h

## 2016-06-08 NOTE — Progress Notes (Signed)
STROKE TEAM PROGRESS NOTE   HISTORY OF PRESENT ILLNESS (per record) [name] is an 41 y.o. female who presented for further assessment of mental status changes occurring after dialysis on Friday. Per EMS, she became unresponsive at the end of her treatment. She arrived to the ED with a GCS of 11 and a CBG of 188. The personnel at the dialysis center were unsure of when she was last known normal.   In the ED her WBC count was noted to be 20. She had no fever but was mildly tachycardic and tachypneic. Ammonia level was normal. ABG and clinical parameters showed no indication for ventilatory support. CT head showed no acute changes, with atrophy and chronic infarctions noted.   MRI/MRA of the head was obtained in the ED, revealing a cluster of 3 small acute ischemic infarctions involving the deep grey nuclei and posterior hippocampus on the right. MRA showed slow flow versus occlusion of the right MCA which was felt to be chronic given prior CT findings. 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 The left M1 segment was noted to be chronically stenotic.   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 Also had a recent admission from 3/21 - 4/20 for encephalopathy.   LSN: Unknown tPA Given: No: Not a tPA candidate given no known time of neurological symptom onset   SUBJECTIVE (INTERVAL HISTORY) No family members present. The patient opens eyes but does not follow commands. There is a history of encephalopathy. Mental status seems out of proportion to the patient's stroke.   OBJECTIVE Temp:  [97.6 F (36.4 C)-98.1 F (36.7 C)] 98.1 F (36.7 C) (04/28 1217) Pulse Rate:  [102-124] 116 (04/28 0421) Cardiac Rhythm: Sinus tachycardia (04/28 1200) Resp:   [0-29] 23 (04/28 1500) BP: (87-165)/(29-146) 109/78 (04/28 1500) SpO2:  [97 %-100 %] 100 % (04/28 1200) Weight:  [105.3 kg (232 lb 2.3 oz)] 105.3 kg (232 lb 2.3 oz) (04/28 0421)  CBC:   Recent Labs Lab 06/09/2016 1745 06/06/2016 1804 06/08/16 0448  WBC 20.0*  --  18.1*  NEUTROABS 16.4*  --  14.4*  HGB 9.1* 10.2* 8.9*  HCT 30.3* 30.0* 29.6*  MCV 91.0  --  90.2  PLT 406*  --  419*    Basic Metabolic Panel:   Recent Labs Lab 06/06/2016 1745 05/14/2016 1804 06/08/16 0448  NA 136 135 136  K 4.2 4.2 4.1  CL 97* 99* 96*  CO2 23  --  23  GLUCOSE 194* 195* 176*  BUN 17 22* 26*  CREATININE 2.37* 2.20* 3.06*  CALCIUM 8.7*  --  9.4    Lipid Panel:     Component Value Date/Time   CHOL 160 06/08/2016 0448   TRIG 146 06/08/2016 0448   HDL 45 06/08/2016 0448   CHOLHDL 3.6 06/08/2016 0448   VLDL 29 06/08/2016 0448   LDLCALC 86 06/08/2016 0448   HgbA1c:  Lab Results  Component Value Date   HGBA1C 5.2 03/09/2016   Urine Drug Screen:     Component Value Date/Time   LABOPIA NEGATIVE 09/05/2013 1822   LABOPIA NEG 08/26/2012 1251   COCAINSCRNUR NEGATIVE 09/05/2013 1822   LABBENZ NEGATIVE 09/05/2013 1822   AMPHETMU NEGATIVE 09/05/2013 1822    Alcohol Level     Component Value Date/Time   ETH <5  05/29/2016 1735    IMAGING  Ct Head Wo Contrast 05/19/2016 1. No acute intracranial abnormality identified.  2. Stable chronic basal ganglia and cerebellar hemisphere lacunar infarcts.     Mr [name] Head Wo Contrast 05/20/2016  MRI HEAD:  Motion degraded examination. 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 Multiple old infarcts. Moderate parenchymal brain volume loss for age.   MRA HEAD:  Moderately motion degraded examination. Slow flow versus occluded MCA, likely chronic given prior CT findings. Chronically occluded RIGHT internal carotid artery, and LEFT vertebral artery. Chronically stenotic proximal LEFT M1 segment, difficult to quantify. 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     Dg Chest Portable 1 View 06/02/2016 1. No radiopaque metallic foreign object.  2. No acute cardiopulmonary process.  Mild cardiomegaly.  3. Right IJ dialysis catheter with tip at the cavoatrial junction.    EEG 06/08/2016 EEG Abnormalities: 1) generalized irregular slow activity 2) slow PDR Clinical Interpretation: This EEG is consistent with a moderate generalized non-specific cerebral dysfunction(encephalopathy).  There was no seizure or seizure predisposition recorded on this study. Please note that a normal EEG does not preclude the possibility of epilepsy.    PHYSICAL EXAM Middle aged  morbidly obese lady not in distress. . Afebrile. Head is nontraumatic. Neck is supple without bruit.    Cardiac exam no murmur or gallop. Lungs are clear to auscultation. Lower extremity edema with chronic wounds and wound vac on left medial thigh.  Neurological Exam :  Obtunded. Opens eyes slowly to sternal rub and can follow gaze horizontally and fixate but. does not follow any commands. No volitional movement except for head turning away from noxious.  Cranial Nerves: II:  Blinks to threat bilaterally immediately after sternal rub. Pupils sluggishly reactive.  III,IV, VI: Eyes with mild exotropia and slow roving EOM. Slow roving EOM occur towards the side of noxious stimuli at times.  V,VII: Mildly decreased grimace on left side with noxious, on a somewhat flaccid background.  VIII: no response to questions or commands IX,X: unable to visualize palate XI: turns head to left and right in response to noxious XII: does not follow command for tongue extension Motor/Sensory:  Mildly increased flexor tone RUE. Flickers fingers to noxious on right. Flaccid LUE tone. No movement to noxious. Bilateral lower extremities weakly withdraw to plantar stimulation with 2/5 strength, slightly more briskly on the right.  Deep Tendon Reflexes:  Right  brachioradialis and biceps 2+. Left brachioradialis and biceps 1+. Absent patellar and achilles reflexes bilaterally.  Plantars: Equivocal responses bilaterally.  Cerebellar/Gait: Unable to assess.  ASSESSMENT/PLAN [name] is a 41 y.o. female with history of end-stage renal disease on dialysis, diabetes mellitus, previous strokes, morbid obesity, hypertension, congestive heart failure, multiple pressure ulcers and asthma presenting with unresponsiveness. She did not receive IV t-PA due to unknown time of onset.  Stroke:  Acute subcentimeter RIGHT deep gray nuclei and RIGHT hippocampal infarcts   Resultant  Altered mental status and left hemiplegia  MRI  Acute subcentimeter RIGHT deep gray nuclei and RIGHT hippocampal infarcts  MRA Slow flow versus occluded MCA. Chronically occluded RIGHT internal carotid artery, and LEFT vertebral artery.  EEG - consistent with a moderate generalized non-specific cerebral dysfunction(encephalopathy). See above  Carotid Doppler - will order  2D Echo - pending  LDL - 86  HgbA1c pending  VTE prophylaxis - SCDs Diet NPO time specified  No antithrombotic prior to admission, now on aspirin 300 mg suppository daily  Patient will be counseled to be compliant with her antithrombotic medications  Ongoing aggressive stroke risk factor management  Therapy recommendations: pending  Disposition: Pending  Hypertension  Blood pressure tends to run low  Permissive hypertension (OK if < 220/120) but gradually normalize in 5-7 days  Long-term BP goal normotensive  Hyperlipidemia  Home meds: Lipitor 20 mg daily not resumed  LDL 86, goal < 70  Increase Lipitor to 40 mg daily when PO access is available  Continue statin at discharge  Diabetes  HgbA1c pending, goal < 7.0  Unc / Controlled  Other Stroke Risk Factors  Obesity, Body mass index is 42.46 kg/m., recommend weight loss, diet and exercise as appropriate   Hx  stroke/TIA   Other Active Problems  Leukocytosis  Anemia  End-stage renal disease  Multiple pressure and skin ulcers  History of encephalopathy  Hospital day # 0  Delton See PA-C [facility] Pager [phone] 06/08/2016, 5:00 PM  I have personally examined this patient, reviewed notes, independently viewed imaging studies, participated in medical decision making and plan of care.ROS completed by me personally and pertinent positives fully documented  I have made any additions or clarifications directly to the above note. Agree with note above.  She has presented with altered mental status and left hemiparesism following dialysis and MRI shows small right deep brain infarcts but exam appears disproportionate to size of her infarcts and suspect underlying encephalopathy as well. Continue to follow and finish stroke work up. EEG shows no seizures.No family available at  bedside for discussion. This patient is critically ill and at significant risk of neurological worsening, death and care requires constant monitoring of vital signs, hemodynamics,respiratory and cardiac monitoring, extensive review of multiple databases, frequent neurological assessment, discussion with family, other specialists and medical decision making of high complexity.I have made any additions or clarifications directly to the above note.This critical care time does not reflect procedure time, or teaching time or supervisory time of PA/NP/Med Resident etc but could involve care discussion time.  I spent 30 minutes of neurocritical care time  in the care of  this patient.     Delia Heady, MD Medical Director [facility] Pager: [phone] 06/08/2016 5:47 PM  To contact Stroke Continuity provider, please refer to [email or web address]. After hours, contact General Neurology

## 2016-06-08 NOTE — Evaluation (Signed)
Physical Therapy Evaluation Patient Details Name: [name] MRN: [id number] DOB: 28-May-1975 Today's Date: 06/08/2016   History of Present Illness  Patient is a 41 yo female admitted 05/13/2016 with AMS from Dialysis Center.  Patient with Rt infarcts, occluded MCA, Rt ICA, and Lt vertebral artery. No verbalizations or voluntary movement.   PMH:  CVA with RLE weak, DM, obesity, chronic lymphedema, wound Rt calf, VAC on Lt thigh, HTN, CHF, depression, ESRD on HD, asthma  Clinical Impression  Patient presents with problem listed below.  Patient with no voluntary movement of LE's or LUE. Patient appeared to try to lift RUE.  Patient unable to verbalize during session or follow commands.  Did open eyes and turn head to voice. [name] place patient on 2 week trial period with PT to assess any progress with mobility.  Recommend patient return to SNF at discharge for continued therapy.    Follow Up Recommendations SNF;Supervision/Assistance - 24 hour    Equipment Recommendations  Hospital bed    Recommendations for Other Services       Precautions / Restrictions Precautions Precautions: Fall Restrictions Weight Bearing Restrictions: No      Mobility  Bed Mobility Overal bed mobility: Needs Assistance Bed Mobility: Rolling Rolling: +2 for physical assistance;Total assist         General bed mobility comments: Patient unable to initiate motion to roll, or to follow movements once initiated passively.  Transfers   Equipment used:  (Needs lift equipment)                Ambulation/Gait                Stairs            Wheelchair Mobility    Modified Rankin (Stroke Patients Only)       Balance                                             Pertinent Vitals/Pain Pain Assessment: Faces Faces Pain Scale: No hurt    Home Living Family/patient expects to be discharged to:: Skilled nursing facility Living Arrangements:  Spouse/significant other;Children Available Help at Discharge: Family;Available 24 hours/day Type of Home: Apartment Home Access: Level entry     Home Layout: One level Home Equipment: Bedside commode;Walker - 2 wheels;Wheelchair - Engineer, technical sales - power;Cane - quad;Shower seat Additional Comments: pt has 3, 4, and 7 yo children    Prior Function Level of Independence: Needs assistance   Gait / Transfers Assistance Needed: Transfers to w/c on with assist or stays in bed  ADL's / Homemaking Assistance Needed: Husband and sister had been assisting patient with bathing, dressing, and caring for children  Comments: Information from medical record     Hand Dominance   Dominant Hand: Right    Extremity/Trunk Assessment   Upper Extremity Assessment Upper Extremity Assessment: Defer to OT evaluation    Lower Extremity Assessment Lower Extremity Assessment: RLE deficits/detail;LLE deficits/detail RLE Deficits / Details: Patient unable to move RLE actively.  PROM limited due to body habitus.  Noted areas of darkened skin lower leg. LLE Deficits / Details: Patient unable to move RLE actively.  PROM limited due to body habitus.       Communication   Communication: Expressive difficulties;Receptive difficulties  Cognition Arousal/Alertness: Lethargic (Eyes open during session) Behavior During Therapy: Flat affect Overall Cognitive Status: Difficult to  assess                                 General Comments: Attempts to verbalize.  Appeared to try to follow simple command with verbal and tactile cues.      General Comments      Exercises     Assessment/Plan    PT Assessment Patient needs continued PT services  PT Problem List Decreased strength;Decreased activity tolerance;Decreased balance;Decreased mobility;Impaired tone;Obesity;Decreased skin integrity       PT Treatment Interventions Functional mobility training;Therapeutic activities;Therapeutic  exercise;Neuromuscular re-education;Patient/family education    PT Goals (Current goals can be found in the Care Plan section)  Acute Rehab PT Goals Patient Stated Goal: Unable to state PT Goal Formulation: Patient unable to participate in goal setting Time For Goal Achievement: 06/22/16 Potential to Achieve Goals: Poor    Frequency Min 2X/week   Barriers to discharge        Co-evaluation               End of Session   Activity Tolerance: Patient limited by lethargy Patient left: in bed;with call bell/phone within reach Nurse Communication: Need for lift equipment PT Visit Diagnosis: Muscle weakness (generalized) (M62.81);Difficulty in walking, not elsewhere classified (R26.2)    Time: 4540-9811 PT Time Calculation (min) (ACUTE ONLY): 10 min   Charges:   PT Evaluation $PT Eval Moderate Complexity: 1 Procedure     PT G Codes:        [name], [facility] Acute Rehab Services Pager [phone]   Vena Austria 06/08/2016, 6:28 PM

## 2016-06-08 NOTE — Procedures (Signed)
History: 41 year old female with unresponsiveness during dialysis  Sedation: None  Technique: This is a 21 channel routine scalp EEG performed at the bedside with bipolar and monopolar montages arranged in accordance to the international 10/20 system of electrode placement. One channel was dedicated to EKG recording.    Background: The background consists of irregular delta and theta activities with a posterior dominant rhythm achieving a maximal frequency of 7.5 Hz. Sleep is not recorded.  Photic stimulation: Physiologic driving is not performed  EEG Abnormalities: 1) generalized irregular slow activity 2) slow PDR  Clinical Interpretation: This EEG is consistent with a moderate generalized non-specific cerebral dysfunction(encephalopathy).   There was no seizure or seizure predisposition recorded on this study. Please note that a normal EEG does not preclude the possibility of epilepsy.   Linda Slot, MD [facility] [phone]  If 7pm- 7am, please page neurology on call as listed in AMION.

## 2016-06-08 NOTE — Progress Notes (Signed)
EEG completed, results pending. 

## 2016-06-08 NOTE — ED Notes (Signed)
Pt returned from mri  No change in her condition.  Keeps both eyes open and tracks me around the room

## 2016-06-09 ENCOUNTER — Inpatient Hospital Stay (HOSPITAL_COMMUNITY): Payer: Medicaid Other

## 2016-06-09 LAB — CBC
HEMATOCRIT: 26.4 % — AB (ref 36.0–46.0)
Hemoglobin: 8.1 g/dL — ABNORMAL LOW (ref 12.0–15.0)
MCH: 27.4 pg (ref 26.0–34.0)
MCHC: 30.7 g/dL (ref 30.0–36.0)
MCV: 89.2 fL (ref 78.0–100.0)
Platelets: 357 10*3/uL (ref 150–400)
RBC: 2.96 MIL/uL — AB (ref 3.87–5.11)
RDW: 17.8 % — AB (ref 11.5–15.5)
WBC: 18.4 10*3/uL — AB (ref 4.0–10.5)

## 2016-06-09 LAB — COMPREHENSIVE METABOLIC PANEL
ALT: 25 U/L (ref 14–54)
ANION GAP: 15 (ref 5–15)
AST: 17 U/L (ref 15–41)
Albumin: 1.8 g/dL — ABNORMAL LOW (ref 3.5–5.0)
Alkaline Phosphatase: 101 U/L (ref 38–126)
BILIRUBIN TOTAL: 0.8 mg/dL (ref 0.3–1.2)
BUN: 37 mg/dL — AB (ref 6–20)
CO2: 25 mmol/L (ref 22–32)
Calcium: 9.5 mg/dL (ref 8.9–10.3)
Chloride: 97 mmol/L — ABNORMAL LOW (ref 101–111)
Creatinine, Ser: 3.99 mg/dL — ABNORMAL HIGH (ref 0.44–1.00)
GFR calc Af Amer: 15 mL/min — ABNORMAL LOW (ref >60)
GFR, EST NON AFRICAN AMERICAN: 13 mL/min — AB (ref >60)
Glucose, Bld: 157 mg/dL — ABNORMAL HIGH (ref 65–99)
POTASSIUM: 3.8 mmol/L (ref 3.5–5.1)
Sodium: 137 mmol/L (ref 135–145)
TOTAL PROTEIN: 8 g/dL (ref 6.5–8.1)

## 2016-06-09 LAB — HEMOGLOBIN A1C
Hgb A1c MFr Bld: 6.6 % — ABNORMAL HIGH (ref 4.8–5.6)
Mean Plasma Glucose: 143 mg/dL

## 2016-06-09 LAB — GLUCOSE, CAPILLARY
Glucose-Capillary: 149 mg/dL — ABNORMAL HIGH (ref 65–99)
Glucose-Capillary: 154 mg/dL — ABNORMAL HIGH (ref 65–99)
Glucose-Capillary: 141 mg/dL — ABNORMAL HIGH (ref 65–99)

## 2016-06-09 LAB — MAGNESIUM: Magnesium: 2.2 mg/dL (ref 1.7–2.4)

## 2016-06-09 LAB — PHOSPHORUS: PHOSPHORUS: 5 mg/dL — AB (ref 2.5–4.6)

## 2016-06-09 MED ORDER — METOPROLOL TARTRATE 5 MG/5ML IV SOLN
2.5000 mg | Freq: Three times a day (TID) | INTRAVENOUS | Status: DC
  Administered 2016-06-09 – 2016-06-10 (×3): 2.5 mg via INTRAVENOUS
  Filled 2016-06-09 (×3): qty 5

## 2016-06-09 NOTE — Progress Notes (Signed)
RR nurse called to notify need for a Qshift NIH scale on this pt. Said would complete before shift change.  Will continue to monitor.

## 2016-06-09 NOTE — Plan of Care (Signed)
Problem: Safety: Goal: Ability to remain free from injury will improve Outcome: Progressing Bed alarm on, room near nurses station will continue to monitor

## 2016-06-09 NOTE — Progress Notes (Signed)
STROKE TEAM PROGRESS NOTE   HISTORY OF PRESENT ILLNESS (per record) [name] is an 41 y.o. female who presented for further assessment of mental status changes occurring after dialysis on Friday. Per EMS, she became unresponsive at the end of her treatment. She arrived to the ED with a GCS of 11 and a CBG of 188. The personnel at the dialysis center were unsure of when she was last known normal.   In the ED her WBC count was noted to be 20. She had no fever but was mildly tachycardic and tachypneic. Ammonia level was normal. ABG and clinical parameters showed no indication for ventilatory support. CT head showed no acute changes, with atrophy and chronic infarctions noted.   MRI/MRA of the head was obtained in the ED, revealing a cluster of 3 small acute ischemic infarctions involving the deep grey nuclei and posterior hippocampus on the right. MRA showed slow flow versus occlusion of the right MCA which was felt to be chronic given prior CT findings. 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 The left M1 segment was noted to be chronically stenotic.   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 Also had a recent admission from 3/21 - 4/20 for encephalopathy.   LSN: Unknown tPA Given: No: Not a tPA candidate given no known time of neurological symptom onset   SUBJECTIVE (INTERVAL HISTORY) No family members present. The patient is more alert and interactive today but yet not following commands consistently or speakinghere is a history of encephalopathy. Mental status seems out of proportion to the patient's stroke.   OBJECTIVE Temp:  [97.5 F (36.4 C)-99.7 F (37.6 C)] 99.1 F (37.3 C) (04/29 0800) Pulse Rate:  [106-130] 106 (04/29 0800) Cardiac  Rhythm: Sinus tachycardia (04/29 0800) Resp:  [11-25] 20 (04/29 0800) BP: (102-126)/(51-102) 121/59 (04/29 0800) SpO2:  [96 %-100 %] 99 % (04/29 0800) Weight:  [102.7 kg (226 lb 6.6 oz)] 102.7 kg (226 lb 6.6 oz) (04/29 0500)  CBC:   Recent Labs Lab 05/30/2016 1745  06/08/16 0448 06/09/16 0258  WBC 20.0*  --  18.1* 18.4*  NEUTROABS 16.4*  --  14.4*  --   HGB 9.1*  < > 8.9* 8.1*  HCT 30.3*  < > 29.6* 26.4*  MCV 91.0  --  90.2 89.2  PLT 406*  --  419* 357  < > = values in this interval not displayed.  Basic Metabolic Panel:   Recent Labs Lab 06/08/16 0448 06/09/16 0258  NA 136 137  K 4.1 3.8  CL 96* 97*  CO2 23 25  GLUCOSE 176* 157*  BUN 26* 37*  CREATININE 3.06* 3.99*  CALCIUM 9.4 9.5  MG  --  2.2  PHOS  --  5.0*    Lipid Panel:     Component Value Date/Time   CHOL 160 06/08/2016 0448   TRIG 146 06/08/2016 0448   HDL 45 06/08/2016 0448   CHOLHDL 3.6 06/08/2016 0448   VLDL 29 06/08/2016 0448   LDLCALC 86 06/08/2016 0448   HgbA1c:  Lab Results  Component Value Date   HGBA1C 5.2 03/09/2016   Urine Drug Screen:     Component Value Date/Time   LABOPIA NEGATIVE 09/05/2013 1822   LABOPIA NEG 08/26/2012 1251   COCAINSCRNUR NEGATIVE 09/05/2013 1822   LABBENZ  NEGATIVE 09/05/2013 1822   AMPHETMU NEGATIVE 09/05/2013 1822    Alcohol Level     Component Value Date/Time   ETH <5 05/12/2016 1735    IMAGING  Ct Head Wo Contrast 05/28/2016 1. No acute intracranial abnormality identified.  2. Stable chronic basal ganglia and cerebellar hemisphere lacunar infarcts.     Mr Maxine Glenn Head Wo Contrast 05/13/2016  MRI HEAD:  Motion degraded examination. 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 Multiple old infarcts. Moderate parenchymal brain volume loss for age.   MRA HEAD:  Moderately motion degraded examination. Slow flow versus occluded MCA, likely chronic given prior CT findings. Chronically occluded RIGHT internal carotid  artery, and LEFT vertebral artery. Chronically stenotic proximal LEFT M1 segment, difficult to quantify. 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     Dg Chest Portable 1 View 05/22/2016 1. No radiopaque metallic foreign object.  2. No acute cardiopulmonary process.  Mild cardiomegaly.  3. Right IJ dialysis catheter with tip at the cavoatrial junction.    EEG 06/08/2016 EEG Abnormalities: 1) generalized irregular slow activity 2) slow PDR Clinical Interpretation: This EEG is consistent with a moderate generalized non-specific cerebral dysfunction(encephalopathy).  There was no seizure or seizure predisposition recorded on this study. Please note that a normal EEG does not preclude the possibility of epilepsy.    PHYSICAL EXAM Middle aged  morbidly obese lady not in distress. . Afebrile. Head is nontraumatic. Neck is supple without bruit.    Cardiac exam no murmur or gallop. Lungs are clear to auscultation. Lower extremity edema with chronic wounds and wound vac on left medial thigh.  Neurological Exam :  Awake alert and can follow gaze horizontally and fixate but. does not follow any commands.Tries to speak a few words but difficult to understand No volitional movement except for head turning away from noxious.  Cranial Nerves: II:  Blinks to threat bilaterally immediately after sternal rub. Pupils sluggishly reactive.  III,IV, VI: Eyes with mild exotropia and slow roving EOM. Slow roving EOM occur towards the side of noxious stimuli at times.  V,VII: Mildly decreased grimace on left side with noxious, on a somewhat flaccid background.  VIII: no response to questions or commands IX,X: unable to visualize palate XI: turns head to left and right in response to noxious XII: does not follow command for tongue extension Motor/Sensory:  Mildly increased flexor tone RUE. Flickers fingers to noxious on right. Flaccid LUE tone. No movement to noxious. Bilateral lower extremities  weakly withdraw to plantar stimulation with 2/5 strength, slightly more briskly on the right.  Deep Tendon Reflexes:  Right brachioradialis and biceps 2+. Left brachioradialis and biceps 1+. Absent patellar and achilles reflexes bilaterally.  Plantars: Equivocal responses bilaterally.  Cerebellar/Gait: Unable to assess.  ASSESSMENT/PLAN [name] is a 41 y.o. female with history of end-stage renal disease on dialysis, diabetes mellitus, previous strokes, morbid obesity, hypertension, congestive heart failure, multiple pressure ulcers and asthma presenting with unresponsiveness. She did not receive IV t-PA due to unknown time of onset.  Stroke:  Acute subcentimeter RIGHT deep gray nuclei and RIGHT hippocampal infarcts likely small vessel disease with underlying encepaholpathy from renal failure  Resultant  Altered mental status and left hemiplegia  MRI  Acute subcentimeter RIGHT deep gray nuclei and RIGHT hippocampal infarcts  MRA Slow flow versus occluded MCA. Chronically occluded RIGHT internal carotid artery, and LEFT vertebral artery.  EEG - consistent with a moderate generalized non-specific cerebral dysfunction(encephalopathy). See above  Carotid Doppler -  will order  2D Echo - pending  LDL - 86  HgbA1c pending  VTE prophylaxis - SCDs Diet NPO time specified  No antithrombotic prior to admission, now on aspirin 300 mg suppository daily  Patient will be counseled to be compliant with her antithrombotic medications  Ongoing aggressive stroke risk factor management  Therapy recommendations: pending  Disposition: Pending  Hypertension  Blood pressure tends to run low  Permissive hypertension (OK if < 220/120) but gradually normalize in 5-7 days  Long-term BP goal normotensive  Hyperlipidemia  Home meds: Lipitor 20 mg daily not resumed  LDL 86, goal < 70  Increase Lipitor to 40 mg daily when PO access is available  Continue statin at  discharge  Diabetes  HgbA1c pending, goal < 7.0  Unc / Controlled  Other Stroke Risk Factors  Obesity, Body mass index is 41.41 kg/m., recommend weight loss, diet and exercise as appropriate   Hx stroke/TIA   Other Active Problems  Leukocytosis  Anemia  End-stage renal disease  Multiple pressure and skin ulcers  History of encephalopathy  Hospital day # 1   I have personally examined this patient, reviewed notes, independently viewed imaging studies, participated in medical decision making and plan of care.ROS completed by me personally and pertinent positives fully documented  I have made any additions or clarifications directly to the above note. .  She has presented with altered mental status and left hemiparesism following dialysis and MRI shows small right deep brain infarcts but exam appears disproportionate to size of her infarcts and suspect underlying encephalopathy as well. Continue to follow   No family available at  bedside for discussion.D/w Dr Dolphus Jenny and answered questions. This patient is critically ill and at significant risk of neurological worsening, death and care requires constant monitoring of vital signs, hemodynamics,respiratory and cardiac monitoring, extensive review of multiple databases, frequent neurological assessment, discussion with family, other specialists and medical decision making of high complexity.I have made any additions or clarifications directly to the above note.This critical care time does not reflect procedure time, or teaching time or supervisory time of PA/NP/Med Resident etc but could involve care discussion time.  I spent 30 minutes of neurocritical care time  in the care of  this patient.   Delia Heady, MD Medical Director [facility] Stroke Center Pager: [phone] 06/09/2016 1:32 PM  To contact Stroke Continuity provider, please refer to [email or web address]. After hours, contact General Neurology

## 2016-06-09 NOTE — Evaluation (Signed)
Clinical/Bedside Swallow Evaluation Patient Details  Name: [name] MRN: [id number] Date of Birth: September 02, 1975  Today's Date: 06/09/2016 Time: SLP Start Time (ACUTE ONLY): 0940 SLP Stop Time (ACUTE ONLY): 0945 SLP Time Calculation (min) (ACUTE ONLY): 5 min  Past Medical History:  Past Medical History:  Diagnosis Date  . Anasarca 02/05/2015  . Asthma   . CHF (congestive heart failure) (HCC)    diastolic  . CVA (cerebral infarction) 1990's   "writing is not the same since" R leg weakness   . GERD (gastroesophageal reflux disease)   . Hypertension   . Morbid obesity (HCC)   . Palliative care encounter   . Stroke (HCC)   . Type II diabetes mellitus (HCC)    Past Surgical History:  Past Surgical History:  Procedure Laterality Date  . AV FISTULA PLACEMENT Left 05/14/2016   Procedure: ARTERIOVENOUS (AV) LEFT UPPER ARM USING GORETEX STRETCHED VASCULAR GRAFT;  Surgeon: Chuck Hint, MD;  Location: [facility] OR;  Service: Vascular;  Laterality: Left;  . INCISION AND DRAINAGE OF WOUND Left 08/30/2013   thigh necrotic wound/notes 08/30/2013  . IR GENERIC HISTORICAL  05/07/2016   IR FLUORO GUIDE CV LINE RIGHT 05/07/2016 Richarda Overlie, MD [facility]-INTERV RAD  . IR GENERIC HISTORICAL  05/07/2016   IR US GUIDE VASC ACCESS RIGHT 05/07/2016 Richarda Overlie, MD [facility]  . IRRIGATION AND DEBRIDEMENT ABSCESS Left 08/30/2013   Procedure: IRRIGATION AND DEBRIDEMENT ABSCESS Left Thigh Necrotic Wound;  Surgeon: [name], MD;  Location: [facility] OR;  Service: General;  Laterality: Left;  . TEE WITHOUT CARDIOVERSION N/A 09/07/2013   Procedure: TRANSESOPHAGEAL ECHOCARDIOGRAM (TEE);  Surgeon: [name], MD;  Location[facility] ENDOSCOPY;  Service: Cardiovascular;  Laterality: N/A;  . TEE WITHOUT CARDIOVERSION N/A 02/08/2015   Procedure: TRANSESOPHAGEAL ECHOCARDIOGRAM (TEE);  Surgeon: Chilton Si, MD;  Location: [facility] ENDOSCOPY;  Service: Cardiovascular;  Laterality: N/A;  . WOUND DEBRIDEMENT Left 05/21/2016    Procedure: DEBRIDEMENT LEFT THIGH WOUND;  Surgeon: Emelia Loron, MD;  Location: [facility] OR;  Service: General;  Laterality: Left;   HPI:  Patient is a 41 yo female admitted 30-Jun-2016 with AMS from Dialysis Center. Patient with Rt deep gray nuclei, right hippocampal infarcts, slow flow vs occluded MCA, Chronically occluded Rt ICA and Lt vertebral artery. No verbalizations or voluntary movement. PMH: CVA with RLE weak, GERD, DM, obesity, chronic lymphedema, wound Rt calf, VAC on Lt thigh, HTN, CHF, depression, ESRD on HD, asthma. CXR 4/27 with no acute findings. SLP initially consulted for cognitive-linguistic evaluation 4/28 which was not performed due to patient's level of alertness. Failed nursing swallow screen on 4/27, bedside swallowing evaluation ordered this morning. Received treatment for cognitive-communication deficits by ST during prior admission for CVA (08/2013).   Assessment / Plan / Recommendation Clinical Impression  Patient presents with severe risk for aspiration and risk for inadequate nutrition/hydration. She is alert, minimally responsive upon SLP entry. Makes eye contact, appears to attempt verbal response to question re: name (phonates, minimally shapes unintelligible sounds). Unable to follow commands for thorough oral/motor assessment. SLP performed oral care, with tactile cues pt opens mouth, is noted to close her mouth around oral swab, manipulate tongue minimally. SLP provided thermal/tactile stimulation to lips with ice chip via teaspoon, pt moves lips slightly and accepts ice chip. Absent oral manipulation initially; SLP removed ice chip with teaspoon and provided oral suction. Pt closes her mouth, with max verbal cues initiated swallow approximately 10 seconds after initial ice chip presentation. Continue NPO, SLP will follow  for readiness for PO/instrumental assessment. Pt may need cortrak or alternative means of nutrition/hydration given severity of deficits. SLP Visit  Diagnosis: Dysphagia, unspecified (R13.10)    Aspiration Risk  Severe aspiration risk;Risk for inadequate nutrition/hydration    Diet Recommendation NPO;Alternative means - temporary   Medication Administration: Via alternative means    Other  Recommendations Oral Care Recommendations: Oral care QID   Follow up Recommendations Other (comment) (TBD)      Frequency and Duration min 2x/week  2 weeks       Prognosis Prognosis for Safe Diet Advancement: Good Barriers to Reach Goals: Severity of deficits;Cognitive deficits      Swallow Study   General Date of Onset: 2016/06/12 HPI: Patient is a 41 yo female admitted 06-12-16 with AMS from Dialysis Center. Patient with Rt deep gray nuclei, right hippocampal infarcts, slow flow vs occluded MCA, Chronically occluded Rt ICA and Lt vertebral artery. No verbalizations or voluntary movement. PMH: CVA with RLE weak, GERD, DM, obesity, chronic lymphedema, wound Rt calf, VAC on Lt thigh, HTN, CHF, depression, ESRD on HD, asthma. CXR 4/27 with no acute findings. SLP initially consulted for cognitive-linguistic evaluation 4/28 which was not performed due to patient's level of alertness. Failed nursing swallow screen on 4/27, bedside swallowing evaluation ordered this morning. Received treatment for cognitive-communication deficits by ST during prior admission for CVA (08/2013). Type of Study: Bedside Swallow Evaluation Previous Swallow Assessment: no swallowing assessments in chart Diet Prior to this Study: NPO Temperature Spikes Noted: No Respiratory Status: Room air History of Recent Intubation: No Behavior/Cognition: Alert;Doesn't follow directions Oral Cavity Assessment: Dry;Dried secretions Oral Care Completed by SLP: Yes Oral Cavity - Dentition: Edentulous Vision: Impaired for self-feeding Self-Feeding Abilities: Total assist Patient Positioning: Upright in bed Baseline Vocal Quality: Low vocal intensity;Other (comment) (phonated and  attempted to state name, unintelligible) Volitional Cough: Cognitively unable to elicit Volitional Swallow: Unable to elicit    Oral/Motor/Sensory Function Overall Oral Motor/Sensory Function: Other (comment) (difficult to assess)   Ice Chips Ice chips: Impaired Presentation: Spoon Oral Phase Impairments: Poor awareness of bolus Other Comments:  (removed ice chip)   Thin Liquid Thin Liquid: Not tested    Nectar Thick Nectar Thick Liquid: Not tested   Honey Thick Honey Thick Liquid: Not tested   Puree Puree: Not tested   Solid   GO   Solid: Not tested       Rondel Baton, MS CF-SLP Speech-Language Pathologist [phone]  Arlana Lindau 06/09/2016,10:12 AM

## 2016-06-09 NOTE — Progress Notes (Signed)
[name] TEAM 1 - Stepdown/ICU TEAM  [name]  JXB:[id number] DOB: Aug 23, 1975 DOA: 05/20/2016 PCP: Marletta Lor, NP    Brief Narrative:  41 y.o.woman with a history of CVA, DM2, morbid obesity, chronic lymphedema, HTN, chronic diastolic heart failure, depression, and admission 3/21 > 4/20 for acute encephalopathy, acute renal failure on CKD 5 requiring HD, decompensated heart failure, and infected leg ulcer.  She was discharged to a SNF.  She had been dialyzing, presumably on MWF.  Upon completion of her HD session on the day of presentation, she was noted to have mental status changes.  It was not clear to the personnel when she was last known normal.  She was transferred to the ED where she had a WBC count of 20, Hgb 9, Ammonia level normal.  Head CT was negative for acute process.  MRI/MRA noted acute right deep gray nuclei and right hippocampal infarcts with an occluded R MCA, Right ICA, and Left vertebral artery.  Subjective: The pt has her eyes open, and will track the examiner, but does not otherwise interact in any meaningful way.  She does not withdraw from pain nor does she follow simple commands.  She is in no apparent resp distress nor does she appear to be in any pain.   Assessment & Plan:  Acute, multifocal CVAs with history of multiple prior CVAs bedbound at baseline due to prior CVAs - Neurology following   Possible 7x65mm vegetation L coronary cusp  Blood cx during last admit 1 of 6 + for coag neg Staph strongly suggestive of contaminant - no such mass noted on TTE 03/22/16 - will need TEE to investigate - will arrange w/ Cards in AM    Acute metabolic encephalopathy v/s chronic encephalopathy During last admit mental status apparently waxed and waned - Psych saw in consultation and suggested tx for depression - no clear etiology for current AMS apart from acute CVAs - suspect this is a multifactorial issue   ESRD on HD Nephrology aware she is here and will arrange  for her usual HD on M/W/F  L posterior/medial thigh wound s/p debridement by Gen Surgy 05/21/16 - VAC tx continues - completed a course of abx tx on 05/31/16 - on inspection there is no visible sign of progressive infection presently - with concern for vegetation and w/ elevated WBC I will obtain CT of the thigh to assure there is not a deep pocket of infection not being drained   Morbid obesity - Body mass index is 41.41 kg/m.  HTN BP currently well controlled   DM CBG currently well controlled   Chronic diastolic congestive heart failure TTE 4/28 noted EF 55-60% w/ grade 1 DD  DVT prophylaxis: SCDs Code Status: FULL CODE Family Communication: no family present at time of exam  Disposition Plan: SDU  Consultants:  Neurology   Procedures: none  Antimicrobials:  none   Objective: Blood pressure (!) 100/56, pulse (!) 111, temperature 99.1 F (37.3 C), temperature source Oral, resp. rate 13, height  (1.575 m), weight 102.7 kg (226 lb 6.6 oz), SpO2 98 %.  Intake/Output Summary (Last 24 hours) at 06/09/16 1000 Last data filed at 06/08/16 2300  Gross per 24 hour  Intake                3 ml  Output                0 ml  Net  3 ml   Filed Weights   06/08/16 0421 06/09/16 0500  Weight: 105.3 kg (232 lb 2.3 oz) 102.7 kg (226 lb 6.6 oz)    Examination: General: No acute respiratory distress - awake but not interactive  Lungs: Clear to auscultation bilaterally without wheezes or rhonchi, with good air movement throughout all fields Cardiovascular: Regular rate and rhythm without murmur gallop or rub normal S1 and S2 Abdomen: Nontender, obese, soft, bowel sounds present, no organomegaly, no rebound, no ascites Extremities: No significant cyanosis, clubbing, edema bilateral lower extremities - change of chronic venous statis dermatitis noted B - dressing in tact R inner thigh - VAC dressing in wound bed on L medial/posterior thigh w/ no calor or spreading  erythema w/ edges of wound indurated  Neurologic: Alert but nonconversant and noncommunicative, no Babinski - does not withdraw from painful stimuli    CBC:  Recent Labs Lab 2016/07/07 1745 07/07/2016 1804 06/08/16 0448 06/09/16 0258  WBC 20.0*  --  18.1* 18.4*  NEUTROABS 16.4*  --  14.4*  --   HGB 9.1* 10.2* 8.9* 8.1*  HCT 30.3* 30.0* 29.6* 26.4*  MCV 91.0  --  90.2 89.2  PLT 406*  --  419* 357   Basic Metabolic Panel:  Recent Labs Lab 07-07-16 1745 Jul 07, 2016 1804 06/08/16 0448 06/09/16 0258  NA 136 135 136 137  K 4.2 4.2 4.1 3.8  CL 97* 99* 96* 97*  CO2 23  --  23 25  GLUCOSE 194* 195* 176* 157*  BUN 17 22* 26* 37*  CREATININE 2.37* 2.20* 3.06* 3.99*  CALCIUM 8.7*  --  9.4 9.5  MG  --   --   --  2.2  PHOS  --   --   --  5.0*   GFR: Estimated Creatinine Clearance: 21.2 mL/min (A) (by C-G formula based on SCr of 3.99 mg/dL (H)).  Liver Function Tests:  Recent Labs Lab 07/07/16 1745 06/09/16 0258  AST 28 17  ALT 30 25  ALKPHOS 113 101  BILITOT 1.0 0.8  PROT 8.5* 8.0  ALBUMIN 2.1* 1.8*    Recent Labs Lab 07/07/16 1828  AMMONIA 31    HbA1C: Hgb A1c MFr Bld  Date/Time Value Ref Range Status  03/09/2016 01:52 AM 5.2 4.8 - 5.6 % Final    Comment:    (NOTE)         Pre-diabetes: 5.7 - 6.4         Diabetes: >6.4         Glycemic control for adults with diabetes: <7.0   02/05/2015 04:13 AM 7.8 (H) 4.8 - 5.6 % Final    Comment:    (NOTE)         Pre-diabetes: 5.7 - 6.4         Diabetes: >6.4         Glycemic control for adults with diabetes: <7.0     CBG:  Recent Labs Lab 06/08/16 1619 06/08/16 1930 06/08/16 2341 06/09/16 0320 06/09/16 0804  GLUCAP 165* 152* 128* 149* 154*    Scheduled Meds: . aspirin  300 mg Rectal Daily   Or  . aspirin  325 mg Oral Daily  . chlorhexidine  15 mL Mouth Rinse BID  . insulin aspart  0-9 Units Subcutaneous Q4H  . mouth rinse  15 mL Mouth Rinse q12n4p     LOS: 1 day    Lonia Blood, MD [facility] Office  [phone] Pager - Text Page per Amion as per below:  On-Call/Text Page:      [email or web address]      password [email or web address]  If 7PM-7AM, please contact night-coverage [email or web address] Password [email or web address] 06/09/2016, 10:00 AM

## 2016-06-09 NOTE — Plan of Care (Signed)
Problem: Fluid Volume: Goal: Ability to maintain a balanced intake and output will improve Outcome: Not Progressing Pt NPO d/t failed swallow study. No IVF ordered at this time. Will address with MD in AM.  Problem: Nutrition: Goal: Adequate nutrition will be maintained Outcome: Not Progressing Pt NPO d/t failed swallow study. Will address with MD in AM.  Problem: Coping: Goal: Ability to verbalize positive feelings about self will improve Outcome: Not Progressing Pt aphasic, UTA.

## 2016-06-09 NOTE — Consult Note (Signed)
[facility] Renal Consultation Note    Indication for Consultation:  Management of ESRD/hemodialysis, anemia, hypertension/volume, and secondary hyperparathyroidism. PCP:  HPI: Kamariyah Timberlake is a 41 y.o. female with ESRD, HTN, GERD, Type 2 DM who was admitted with CVA.  Hx from notes as pt is non-verbal today when I examined her. Had just recently started outpt HD after prolonged hospital admission 3/20-4/20/18 with L leg ulcer and decompensated CHF/pulmonary edema, initiated on HD at that time. On 4/27, she completed her outpt dialysis session, but then noted to be very altered and minimally responsive. She was transported to ED via EMS. There, labs showed WBC 20 and MRI brain showed acute CVAs in deep gray nuclei and R hippocampus. Neuro following.  From renal standpoint, he weights have been extremely variable. Dialyzes MWF at [facility] via [facility], although has LUE AVG which was recently placed.  Past Medical History:  Diagnosis Date  . Anasarca 02/05/2015  . Asthma   . CHF (congestive heart failure) (HCC)    diastolic  . CVA (cerebral infarction) 1990's   "writing is not the same since" R leg weakness   . GERD (gastroesophageal reflux disease)   . Hypertension   . Morbid obesity (HCC)   . Palliative care encounter   . Stroke (HCC)   . Type II diabetes mellitus (HCC)    Past Surgical History:  Procedure Laterality Date  . AV FISTULA PLACEMENT Left 05/14/2016   Procedure: ARTERIOVENOUS (AV) LEFT UPPER ARM USING GORETEX STRETCHED VASCULAR GRAFT;  Surgeon: Chuck Hint, MD;  Location: [facility] OR;  Service: Vascular;  Laterality: Left;  . INCISION AND DRAINAGE OF WOUND Left 08/30/2013   thigh necrotic wound/notes 08/30/2013  . IR GENERIC HISTORICAL  05/07/2016   IR FLUORO GUIDE CV LINE RIGHT 05/07/2016 Richarda Overlie, MD [facility]-INTERV RAD  . IR GENERIC HISTORICAL  05/07/2016   IR US GUIDE VASC ACCESS RIGHT 05/07/2016 Richarda Overlie, MD [facility]-INTER[facility] RAD  . IRRIGATION AND DEBRIDEMENT  ABSCESS Left 08/30/2013   Procedure: IRRIGATION AND DEBRIDEMENT ABSCESS Left Thigh Necrotic Wound;  Surgeon: [name], MD;  Location: [facility] OR;  Service: General;  Laterality: Left;  . TEE WITHOUT CARDIOVERSION N/A 09/07/2013   Procedure: TRANSESOPHAGEAL ECHOCARDIOGRAM (TEE);  Surgeon: [name], MD;  Location: [facility] ENDOSCOPY;  Service: Cardiovascular;  Laterality: N/A;  . TEE WITHOUT CARDIOVERSION N/A 02/08/2015   Procedure: TRANSESOPHAGEAL ECHOCARDIOGRAM (TEE);  Surgeon: Chilton Si, MD;  Location: [facility] ENDOSCOPY;  Service: Cardiovascular;  Laterality: N/A;  . WOUND DEBRIDEMENT Left 05/21/2016   Procedure: DEBRIDEMENT LEFT THIGH WOUND;  Surgeon: Emelia Loron, MD;  Location: [facility] OR;  Service: General;  Laterality: Left;   Family History  Problem Relation Age of Onset  . Diabetes Mother   . Kidney disease Mother    Social History:  reports that she has never smoked. She has never used smokeless tobacco. She reports that she does not drink alcohol or use drugs.  ROS: Unable to obtain due to AMS.  Physical Exam: Vitals:   06/09/16 0700 06/09/16 0800 06/09/16 0900 06/09/16 1000  BP: 113/71 (!) 121/59 (!) 100/56 118/79  Pulse: (!) 130 (!) 106 (!) 111   Resp: (!) 23  Temp:  99.1 F (37.3 C)    TempSrc:  Oral    SpO2: 100% 99% 98%   Weight:      Height:         General: Frail female, NAD. Looks much older that stated age. Head: Normocephalic, atraumatic. Neck: Supple  without lymphadenopathy/masses.  Lungs: Clear anteriorly, poor inspiratory effort. Heart: RRR with normal S1, S2. No murmur. Abdomen: Soft, non-tender. Lower extremities: 1+ LE edema, L thigh wound with wound vac in place. Neuro: Nonverbal, moves eyes but otherwise non-responsive towards me Dialysis Access: [facility] R chest, new LUE AVG + bruit.  Allergies  Allergen Reactions  . Azithromycin Other (See Comments)    Nose bleeding event   Prior to Admission medications   Medication Sig Start Date  End Date Taking? Authorizing Provider  albuterol (PROVENTIL) (2.5 MG/3ML) 0.083% nebulizer solution Take 3 mLs (2.5 mg total) by nebulization every 6 (six) hours as needed for wheezing or shortness of breath. 08/12/15   Charm Rings, MD  Amino Acids-Protein Hydrolys (FEEDING SUPPLEMENT, PRO-STAT SUGAR FREE 64,) LIQD Take 30 mLs by mouth 3 (three) times daily. 05/31/16   Meredeth Ide, MD  amoxicillin-clavulanate (AUGMENTIN) 500-125 MG tablet Take 1 tablet (500 mg total) by mouth at bedtime. 05/31/16 06/09/16  Meredeth Ide, MD  atorvastatin (LIPITOR) 20 MG tablet Take 20 mg by mouth daily.    Historical Provider, MD  camphor-menthol [facility]) lotion Apply 1 application topically every 8 (eight) hours as needed for itching. 03/30/16   Dron Jaynie Collins, MD  carvedilol (COREG) 25 MG tablet Take 1 tablet (25 mg total) by mouth 2 (two) times daily with a meal. 01/28/16   Eyvonne Mechanic, PA-C  DULoxetine (CYMBALTA) 30 MG capsule Take 1 capsule (30 mg total) by mouth daily. 05/31/16   Meredeth Ide, MD  insulin aspart (NOVOLOG) 100 UNIT/ML injection Inject 0-20 Units into the skin 3 (three) times daily with meals. CBG < 70: implement hypoglycemia protocol CBG 70 - 120: 0 units CBG 121 - 150: 3 units CBG 151 - 200: 4 units CBG 201 - 250: 7 units CBG 251 - 300: 11 units CBG 301 - 350: 15 units CBG 351 - 400: 20 units 01/28/16   Jeffrey Hedges, PA-C  insulin glargine (LANTUS) 100 UNIT/ML injection Inject 0.14 mLs (14 Units total) into the skin at bedtime. 05/31/16   Meredeth Ide, MD  multivitamin (RENA-VIT) TABS tablet Take 1 tablet by mouth at bedtime. 05/17/16   Renae Fickle, MD  senna-docusate (SENOKOT-S) 8.6-50 MG tablet Take 1 tablet by mouth 2 (two) times daily. 05/17/16   Renae Fickle, MD  sevelamer carbonate (RENVELA) 800 MG tablet Take 1 tablet (800 mg total) by mouth 3 (three) times daily with meals. 05/31/16   Meredeth Ide, MD   Current Facility-Administered Medications  Medication Dose Route  Frequency Provider Last Rate Last Dose  . acetaminophen (TYLENOL) suppository 650 mg  650 mg Rectal Q4H PRN Michael Litter, MD   650 mg at 06/08/16 2335  . aspirin suppository 300 mg  300 mg Rectal Daily Michael Litter, MD   300 mg at 06/08/16 1240   Or  . aspirin tablet 325 mg  325 mg Oral Daily Michael Litter, MD      . chlorhexidine (PERIDEX) 0.12 % solution 15 mL  15 mL Mouth Rinse BID Lonia Blood, MD   15 mL at 06/09/16 9528  . insulin aspart (novoLOG) injection 0-9 Units  0-9 Units Subcutaneous Q4H Michael Litter, MD   2 Units at 06/09/16 [phone]  . MEDLINE mouth rinse  15 mL Mouth Rinse q12n4p Lonia Blood, MD       Labs: Basic Metabolic Panel:  Recent Labs Lab 05/30/2016 1745 05/13/2016 1804 06/08/16 0448 06/09/16 0258  NA 136 135 136 137  K 4.2 4.2 4.1 3.8  CL 97* 99* 96* 97*  CO2 23  --  23 25  GLUCOSE 194* 195* 176* 157*  BUN 17 22* 26* 37*  CREATININE 2.37* 2.20* 3.06* 3.99*  CALCIUM 8.7*  --  9.4 9.5  PHOS  --   --   --  5.0*   Liver Function Tests:  Recent Labs Lab Jun 22, 2016 1745 06/09/16 0258  AST 28 17  ALT 30 25  ALKPHOS 113 101  BILITOT 1.0 0.8  PROT 8.5* 8.0  ALBUMIN 2.1* 1.8*    Recent Labs Lab 2016-06-22 1828  AMMONIA 31   CBC:  Recent Labs Lab 2016/06/22 1745 22-Jun-2016 1804 06/08/16 0448 06/09/16 0258  WBC 20.0*  --  18.1* 18.4*  NEUTROABS 16.4*  --  14.4*  --   HGB 9.1* 10.2* 8.9* 8.1*  HCT 30.3* 30.0* 29.6* 26.4*  MCV 91.0  --  90.2 89.2  PLT 406*  --  419* 357   CBG:  Recent Labs Lab 06/08/16 1619 06/08/16 1930 06/08/16 2341 06/09/16 0320 06/09/16 0804  GLUCAP 165* 152* 128* 149* 154*   Studies/Results: Ct Head Wo Contrast  Result Date: 06/22/16 CLINICAL DATA:  41 y/o  F; decreased mental status. EXAM: CT HEAD WITHOUT CONTRAST TECHNIQUE: Contiguous axial images were obtained from the base of the skull through the vertex without intravenous contrast. COMPARISON:  05/24/2016 and 09/05/2013 CT of the head. 09/03/2013 MRI brain.  FINDINGS: Brain: Stable small chronic lacunar infarct within the right genu of internal capsule, not well seen on prior CT, but present on the 2015 CT and MRI. No evidence for large territory infarct, intracranial hemorrhage, hydrocephalus, or focal mass effect. Stable chronic lacunar infarct within left thalamus, left lentiform nucleus extending into corona radiata, and within bilateral cerebellar hemispheres. Vascular: Moderate calcific atherosclerosis of the cavernous and paraclinoid internal carotid arteries. Skull: Normal. Negative for fracture or focal lesion. Sinuses/Orbits: No acute finding. Other: None. IMPRESSION: 1. No acute intracranial abnormality identified. 2. Stable chronic basal ganglia and cerebellar hemisphere lacunar infarcts. Electronically Signed   By: Mitzi Hansen M.D.   On: 06-22-2016 20:10   Mr Maxine Glenn Head Wo Contrast  Result Date: 06/22/16 CLINICAL DATA:  Altered mental status, spontaneously nonverbal at dialysis. Similar symptoms previously. History of stroke and RIGHT ICA occlusion, diabetes, end-stage renal disease on dialysis, hypertension. EXAM: MRI HEAD WITHOUT CONTRAST MRA HEAD WITHOUT CONTRAST TECHNIQUE: Multiplanar, multiecho pulse sequences of the brain and surrounding structures were obtained without intravenous contrast. Angiographic images of the head were obtained using MRA technique without contrast. COMPARISON:  CT HEAD 2016/06/22 at 1950 hours and MRI of the head September 03, 2013 and CT angiogram of the head and neck September 05, 2013. FINDINGS: Multiple sequences are moderately or severely motion degraded. MRI HEAD FINDINGS Approximately 6 subcentimeter foci of reduced diffusion RIGHT thalamus, RIGHT internal capsule and RIGHT hippocampus with low ADC values. Faint susceptibility artifact associated with old LEFT basal ganglia infarct. Multiple old cerebellar infarcts. Bold LEFT pontine infarcts, Atrophic midbrain. Mild LEFT cerebral peduncle wallerian  degeneration. Old tiny RIGHT posterior frontal lobe infarct. Mild ex vacuo dilatation LEFT lateral ventricle, with generalized mild parenchymal brain volume loss. No hydrocephalus. No midline shift, mass effect or masses. No abnormal extra-axial fluid collections. VASCULAR: See below. SKULL AND UPPER CERVICAL SPINE: No abnormal sellar expansion. No suspicious calvarial bone marrow signal. Craniocervical junction maintained. SINUSES/ORBITS: The mastoid air-cells and included paranasal sinuses are well-aerated. The included ocular globes and orbital contents are  non-suspicious. OTHER: Patient is edentulous. MRA HEAD FINDINGS- moderately motion degraded examination resulting in duplicated appearance of the cerebral arteries. ANTERIOR CIRCULATION: Complete lack of flow related enhancement RIGHT internal carotid artery. LEFT internal carotid artery is patent. Stenotic proximal LEFT M1 segment better seen on prior CTA, anterior and LEFT middle cerebral arteries are patent. POSTERIOR CIRCULATION: No significant flow related enhancement LEFT vertebral artery. 2 mm inferiorly directed out patching vertebrobasilar junction. L patching No large vessel occlusion, high-grade stenosis, abnormal luminal irregularity, aneurysm. ANATOMIC VARIANTS: None. IMPRESSION: MRI HEAD: Motion degraded examination. 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 Multiple old infarcts. Moderate parenchymal brain volume loss for age. MRA HEAD: Moderately motion degraded examination. Slow flow versus occluded MCA, likely chronic given prior CT findings. Chronically occluded RIGHT internal carotid artery, and LEFT vertebral artery. Chronically stenotic proximal LEFT M1 segment, difficult to quantify. 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 Electronically Signed   By: Awilda Metro M.D.   On: 06/06/2016 23:49   Mr Brain Wo Contrast  Result Date: 05/13/2016 CLINICAL DATA:  Altered mental status,  spontaneously nonverbal at dialysis. Similar symptoms previously. History of stroke and RIGHT ICA occlusion, diabetes, end-stage renal disease on dialysis, hypertension. EXAM: MRI HEAD WITHOUT CONTRAST MRA HEAD WITHOUT CONTRAST TECHNIQUE: Multiplanar, multiecho pulse sequences of the brain and surrounding structures were obtained without intravenous contrast. Angiographic images of the head were obtained using MRA technique without contrast. COMPARISON:  CT HEAD June 07, 2016 at 1950 hours and MRI of the head September 03, 2013 and CT angiogram of the head and neck September 05, 2013. FINDINGS: Multiple sequences are moderately or severely motion degraded. MRI HEAD FINDINGS Approximately 6 subcentimeter foci of reduced diffusion RIGHT thalamus, RIGHT internal capsule and RIGHT hippocampus with low ADC values. Faint susceptibility artifact associated with old LEFT basal ganglia infarct. Multiple old cerebellar infarcts. Bold LEFT pontine infarcts, Atrophic midbrain. Mild LEFT cerebral peduncle wallerian degeneration. Old tiny RIGHT posterior frontal lobe infarct. Mild ex vacuo dilatation LEFT lateral ventricle, with generalized mild parenchymal brain volume loss. No hydrocephalus. No midline shift, mass effect or masses. No abnormal extra-axial fluid collections. VASCULAR: See below. SKULL AND UPPER CERVICAL SPINE: No abnormal sellar expansion. No suspicious calvarial bone marrow signal. Craniocervical junction maintained. SINUSES/ORBITS: The mastoid air-cells and included paranasal sinuses are well-aerated. The included ocular globes and orbital contents are non-suspicious. OTHER: Patient is edentulous. MRA HEAD FINDINGS- moderately motion degraded examination resulting in duplicated appearance of the cerebral arteries. ANTERIOR CIRCULATION: Complete lack of flow related enhancement RIGHT internal carotid artery. LEFT internal carotid artery is patent. Stenotic proximal LEFT M1 segment better seen on prior CTA, anterior and  LEFT middle cerebral arteries are patent. POSTERIOR CIRCULATION: No significant flow related enhancement LEFT vertebral artery. 2 mm inferiorly directed out patching vertebrobasilar junction. L patching No large vessel occlusion, high-grade stenosis, abnormal luminal irregularity, aneurysm. ANATOMIC VARIANTS: None. IMPRESSION: MRI HEAD: Motion degraded examination. 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 Multiple old infarcts. Moderate parenchymal brain volume loss for age. MRA HEAD: Moderately motion degraded examination. Slow flow versus occluded MCA, likely chronic given prior CT findings. Chronically occluded RIGHT internal carotid artery, and LEFT vertebral artery. Chronically stenotic proximal LEFT M1 segment, difficult to quantify. 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 Electronically Signed   By: Awilda Metro M.D.   On: 05/12/2016 23:49   Dg Chest Portable 1 View  Result Date: 05/29/2016 CLINICAL DATA:  41 year old female for MRI clearance. EXAM:  PORTABLE CHEST 1 VIEW COMPARISON:  Chest radiograph dated 05/17/2016 FINDINGS: Right IJ Udall dialysis catheter with tip at the cavoatrial junction. There is mild cardiomegaly. The lungs are clear. There is no pleural effusion or pneumothorax. No acute osseous pathology. No radiopaque metallic object identified. IMPRESSION: 1. No radiopaque metallic foreign object. 2. No acute cardiopulmonary process.  Mild cardiomegaly. 3. Right IJ dialysis catheter with tip at the cavoatrial junction. Electronically Signed   By: Elgie Collard M.D.   On: 06/06/2016 22:15    Dialysis Orders:  MWF at [facility] 4:30 hours, 180 dialyzer, BFR400, DFR 800, EDW 105.5kg, 3K/2Ca, TDC - Heparin 4400 unit bolus IV q HD - Mircera IV q 2 weeks (last given 4/25) - Calcitriol 0.36mcg PO q HD  Assessment/Plan: 1.  Acute CVAs: Plan per neuro and intensivist. 2.  AMS: As above. 3.  Abnormal ECHO: Possible  vegetation on L coronary cusp. TEE ordered. Will order BCx. 4.  ESRD: Will continue MWF schedule, next due 4/30. 5.  Hypertension/volume: BP controlled, but volume excess. UF as tolerated. 6.  Anemia: Hgb 8.1. Not due for ESA yet, follow closely. 7.  Metabolic bone disease: CorrCa high, Phos ok. Holding VDRA for now. No binders since not eating currently. 8.  Nutrition: Alb very low.  9.  L thigh wound: Per primary team; wound vac in place.  Ozzie Hoyle, PA-C 06/09/2016, 10:17 AM  [facility] Pager: [phone]

## 2016-06-10 ENCOUNTER — Inpatient Hospital Stay (HOSPITAL_COMMUNITY): Payer: Medicaid Other

## 2016-06-10 ENCOUNTER — Encounter (HOSPITAL_COMMUNITY): Payer: Medicaid Other

## 2016-06-10 DIAGNOSIS — I890 Lymphedema, not elsewhere classified: Secondary | ICD-10-CM

## 2016-06-10 DIAGNOSIS — I509 Heart failure, unspecified: Secondary | ICD-10-CM

## 2016-06-10 DIAGNOSIS — B9689 Other specified bacterial agents as the cause of diseases classified elsewhere: Secondary | ICD-10-CM

## 2016-06-10 DIAGNOSIS — R768 Other specified abnormal immunological findings in serum: Secondary | ICD-10-CM

## 2016-06-10 DIAGNOSIS — Z8673 Personal history of transient ischemic attack (TIA), and cerebral infarction without residual deficits: Secondary | ICD-10-CM

## 2016-06-10 DIAGNOSIS — L02416 Cutaneous abscess of left lower limb: Secondary | ICD-10-CM

## 2016-06-10 DIAGNOSIS — Z9889 Other specified postprocedural states: Secondary | ICD-10-CM

## 2016-06-10 DIAGNOSIS — I130 Hypertensive heart and chronic kidney disease with heart failure and stage 1 through stage 4 chronic kidney disease, or unspecified chronic kidney disease: Secondary | ICD-10-CM

## 2016-06-10 DIAGNOSIS — IMO0002 Reserved for concepts with insufficient information to code with codable children: Secondary | ICD-10-CM

## 2016-06-10 DIAGNOSIS — Z881 Allergy status to other antibiotic agents status: Secondary | ICD-10-CM

## 2016-06-10 DIAGNOSIS — I63331 Cerebral infarction due to thrombosis of right posterior cerebral artery: Secondary | ICD-10-CM

## 2016-06-10 DIAGNOSIS — Z833 Family history of diabetes mellitus: Secondary | ICD-10-CM

## 2016-06-10 DIAGNOSIS — R509 Fever, unspecified: Secondary | ICD-10-CM

## 2016-06-10 DIAGNOSIS — T814XXD Infection following a procedure, subsequent encounter: Secondary | ICD-10-CM

## 2016-06-10 DIAGNOSIS — A419 Sepsis, unspecified organism: Secondary | ICD-10-CM

## 2016-06-10 DIAGNOSIS — Z992 Dependence on renal dialysis: Secondary | ICD-10-CM

## 2016-06-10 DIAGNOSIS — Z841 Family history of disorders of kidney and ureter: Secondary | ICD-10-CM

## 2016-06-10 DIAGNOSIS — E1122 Type 2 diabetes mellitus with diabetic chronic kidney disease: Secondary | ICD-10-CM

## 2016-06-10 DIAGNOSIS — Z7401 Bed confinement status: Secondary | ICD-10-CM

## 2016-06-10 LAB — RENAL FUNCTION PANEL
ANION GAP: 13 (ref 5–15)
Albumin: 1.7 g/dL — ABNORMAL LOW (ref 3.5–5.0)
BUN: 30 mg/dL — ABNORMAL HIGH (ref 6–20)
CHLORIDE: 100 mmol/L — AB (ref 101–111)
CO2: 26 mmol/L (ref 22–32)
Calcium: 8.9 mg/dL (ref 8.9–10.3)
Creatinine, Ser: 3.15 mg/dL — ABNORMAL HIGH (ref 0.44–1.00)
GFR calc Af Amer: 20 mL/min — ABNORMAL LOW (ref >60)
GFR calc non Af Amer: 17 mL/min — ABNORMAL LOW (ref >60)
GLUCOSE: 123 mg/dL — AB (ref 65–99)
POTASSIUM: 3.2 mmol/L — AB (ref 3.5–5.1)
Phosphorus: 2.9 mg/dL (ref 2.5–4.6)
Sodium: 139 mmol/L (ref 135–145)

## 2016-06-10 LAB — GLUCOSE, CAPILLARY
GLUCOSE-CAPILLARY: 164 mg/dL — AB (ref 65–99)
GLUCOSE-CAPILLARY: 139 mg/dL — AB (ref 65–99)

## 2016-06-10 LAB — CBC
HEMATOCRIT: 26.2 % — AB (ref 36.0–46.0)
HEMOGLOBIN: 7.9 g/dL — AB (ref 12.0–15.0)
MCH: 27.1 pg (ref 26.0–34.0)
MCHC: 30.2 g/dL (ref 30.0–36.0)
MCV: 89.7 fL (ref 78.0–100.0)
Platelets: 323 10*3/uL (ref 150–400)
RBC: 2.92 MIL/uL — ABNORMAL LOW (ref 3.87–5.11)
RDW: 18.5 % — ABNORMAL HIGH (ref 11.5–15.5)
WBC: 17.7 10*3/uL — ABNORMAL HIGH (ref 4.0–10.5)

## 2016-06-10 LAB — RPR: RPR: REACTIVE — AB

## 2016-06-10 LAB — HEMOGLOBIN A1C
HEMOGLOBIN A1C: 6.7 % — AB (ref 4.8–5.6)
MEAN PLASMA GLUCOSE: 146 mg/dL

## 2016-06-10 LAB — RPR, QUANT+TP ABS (REFLEX)
Rapid Plasma Reagin, Quant: 1:1 {titer} — ABNORMAL HIGH
TREPONEMA PALLIDUM AB: NEGATIVE

## 2016-06-10 LAB — HIV ANTIBODY (ROUTINE TESTING W REFLEX): HIV Screen 4th Generation wRfx: NONREACTIVE

## 2016-06-10 MED ORDER — METOPROLOL TARTRATE 5 MG/5ML IV SOLN
2.5000 mg | Freq: Three times a day (TID) | INTRAVENOUS | Status: DC
  Administered 2016-06-10 – 2016-06-11 (×3): 2.5 mg via INTRAVENOUS
  Filled 2016-06-10 (×3): qty 5

## 2016-06-10 MED ORDER — HEPARIN SODIUM (PORCINE) 1000 UNIT/ML DIALYSIS
1000.0000 [IU] | INTRAMUSCULAR | Status: DC | PRN
  Filled 2016-06-10: qty 1

## 2016-06-10 MED ORDER — SODIUM CHLORIDE 0.9 % IV SOLN: 100.0000 mL | INTRAVENOUS | Status: DC | PRN

## 2016-06-10 MED ORDER — ALTEPLASE 2 MG IJ SOLR: 2.0000 mg | Freq: Once | INTRAMUSCULAR | Status: DC | PRN

## 2016-06-10 MED ORDER — PIPERACILLIN-TAZOBACTAM 3.375 G IVPB
3.3750 g | Freq: Two times a day (BID) | INTRAVENOUS | Status: DC
  Administered 2016-06-10 – 2016-06-12 (×5): 3.375 g via INTRAVENOUS
  Filled 2016-06-10 (×6): qty 50

## 2016-06-10 MED ORDER — VANCOMYCIN HCL IN DEXTROSE 1-5 GM/200ML-% IV SOLN
1000.0000 mg | INTRAVENOUS | Status: DC
  Administered 2016-06-12 – 2016-06-17 (×3): 1000 mg via INTRAVENOUS
  Filled 2016-06-10 (×6): qty 200

## 2016-06-10 MED ORDER — VANCOMYCIN HCL 10 G IV SOLR
2250.0000 mg | Freq: Two times a day (BID) | INTRAVENOUS | Status: DC
  Filled 2016-06-10: qty 2250

## 2016-06-10 MED ORDER — VANCOMYCIN HCL 10 G IV SOLR
2250.0000 mg | Freq: Once | INTRAVENOUS | Status: AC
  Administered 2016-06-10: 2250 mg via INTRAVENOUS
  Filled 2016-06-10 (×2): qty 2250

## 2016-06-10 MED ORDER — DAKINS (1/4 STRENGTH) 0.125 % EX SOLN
Freq: Two times a day (BID) | CUTANEOUS | Status: AC
  Administered 2016-06-10 (×2)
  Filled 2016-06-10: qty 473

## 2016-06-10 MED ORDER — PNEUMOCOCCAL VAC POLYVALENT 25 MCG/0.5ML IJ INJ: 0.5000 mL | INJECTION | INTRAMUSCULAR | Status: DC

## 2016-06-10 NOTE — Progress Notes (Signed)
SLP Cancellation Note  Patient Details Name: [name] MRN: [id number] DOB: [birth year]   Cancelled treatment:       Reason Eval/Treat Not Completed: Patient's level of consciousness. Pt having cortrak placed. Unresponsive during placement. Arousal not appropriate for PO trials.    Kadiatou Oplinger, Riley Nearing 06/10/2016, 10:01 AM

## 2016-06-10 NOTE — Progress Notes (Signed)
Physical Therapy Treatment Patient Details Name: [name] MRN: [id number] DOB: 1975/03/13 Today's Date: 06/10/2016    History of Present Illness Patient is a 41 yo female admitted 2016-06-25 with AMS from Dialysis Center.  Patient with Rt infarcts, occluded MCA, Rt ICA, and Lt vertebral artery. No verbalizations or voluntary movement.   PMH:  CVA with RLE weak, DM, obesity, chronic lymphedema, wound Rt calf, VAC on Lt thigh, HTN, CHF, depression, ESRD on HD, asthma    PT Comments    Pt unresponsive to verbal/tactile stimulii, other than reflexive withdrawal with touch to L foot. Eyes closed majority of session, though did briefly open eyes a few times. No response to pressure on finger nail beds B hands.  Assisted RN with regular bed to air bed transfer. Pt did not participate with rolling in bed for linen change.   Follow Up Recommendations  SNF;Supervision/Assistance - 24 hour     Equipment Recommendations  Hospital bed    Recommendations for Other Services Rehab consult;OT consult     Precautions / Restrictions Precautions Precautions: Fall Precaution Comments: bil inner thigh wounds that were surgically debrided in OR Restrictions Weight Bearing Restrictions: No    Mobility  Bed Mobility Overal bed mobility: Needs Assistance Bed Mobility: Rolling Rolling: +2 for physical assistance;Total assist         General bed mobility comments: Patient unable to initiate motion to roll, or to follow movements once initiated passively. Pt 0%.   Transfers   Equipment used:  (Needs lift equipment)             General transfer comment: +4 total assist for regular bed to air bed transfer using sheet, no participation from pt  Ambulation/Gait                 Stairs            Wheelchair Mobility    Modified Rankin (Stroke Patients Only)       Balance                                            Cognition Arousal/Alertness:  Lethargic Behavior During Therapy: Flat affect Overall Cognitive Status: Difficult to assess                                 General Comments: pt not responsive to verbal/tactile stimulii, eyes closed throughout session; some reflexive withdrawal of LLE in response to light touch to L foot       Exercises      General Comments        Pertinent Vitals/Pain Pain Assessment: Faces Pain Score: 0-No pain    Home Living                      Prior Function            PT Goals (current goals can now be found in the care plan section) Acute Rehab PT Goals Patient Stated Goal: Unable to state PT Goal Formulation: Patient unable to participate in goal setting Time For Goal Achievement: 06/22/16 Potential to Achieve Goals: Poor Additional Goals Additional Goal #1: Patient will follow simple commands 20% of the time. Progress towards PT goals: Not progressing toward goals - comment (pt not responsive)    Frequency  Min 2X/week      PT Plan Current plan remains appropriate    Co-evaluation PT/OT/SLP Co-Evaluation/Treatment: Yes   PT goals addressed during session: Mobility/safety with mobility        AM-PAC PT "6 Clicks" Daily Activity  Outcome Measure  Difficulty turning over in bed (including adjusting bedclothes, sheets and blankets)?: Total Difficulty moving from lying on back to sitting on the side of the bed? : Total Difficulty sitting down on and standing up from a chair with arms (e.g., wheelchair, bedside commode, etc,.)?: Total Help needed moving to and from a bed to chair (including a wheelchair)?: Total Help needed walking in hospital room?: Total Help needed climbing 3-5 steps with a railing? : Total 6 Click Score: 6    End of Session   Activity Tolerance: Patient limited by lethargy Patient left: in bed;with call bell/phone within reach Nurse Communication: Need for lift equipment PT Visit Diagnosis: Muscle weakness  (generalized) (M62.81);Difficulty in walking, not elsewhere classified (R26.2)     Time: 6045-4098 PT Time Calculation (min) (ACUTE ONLY): 22 min  Charges:  $Therapeutic Activity: 8-22 mins                    G Codes:          Tamala Ser 06/10/2016, 12:13 PM [phone]

## 2016-06-10 NOTE — Consult Note (Signed)
[place] Nurse wound consult note Reason for Consult: left thigh WOC nurse followed last admission s/p debridement of the left thigh wound, she was DC with NPWT, however the tissues were beginning to look slightly necrotic but still viable.   We have been asked to re-consult for evaluation of the thigh wound as well as CCS has been asked to look at the wound.   It should be noted that she also has 2 pressure injuries left and right heel and a full thickness wound on the right posterior calf that she presented with on admission.  She is noted to have several partial thickness areas over the buttocks consistent with MASD (moisture associated skin damage. Reviewed record: noted imaging Soft tissue wound in the upper medial left thigh. 1.8 x 3.6 x 4.2 cm complex fluid collection underlying the wound within the subcutaneous fat with surrounding fat stranding most concerning for an abscess or hematoma.  Wound type: full thickness wound s/p debridement, non healing in the presence of ESRD. ?etiology, was thought to be related to moisture when this would began but now wondering if this could be calciphylaxis? She did have an area on the right inner thigh that had healed at the time of her DC 05/31/16.  Pressure Injury POA: Yes Measurement: Left thigh: 14cm x10cm x 1.0cm with undermining that is 3cm from 12-2 oclock; 80% yellow/10% pink/10% grey black with foul odor Left heel: 2cm x 4cm x 0cm; 100% eschar Right posterior thigh: 3cm x 2cm x 0.1cm; 100% pale pink tissue Right heel: 1cm x 1cm x 0cm; 100% dark purple tissue Wound bed: see above Drainage (amount, consistency, odor) none from the heels, left thigh minimal drainage but foul odor Periwound:  Left thigh: brawny skin changes that surround the wound, was present at the time of her previous admission.  Other sites intact periwound.  Both LE have brawny skin changes consistent with venous stasis and edema  Dressing procedure/placement/frequency: Met  surgical team to assess the left thigh, we have DCed the NPWT. Added Dakins for odor contorl and chemical debridement, patient to return to OR tomorrow for repeat I&D to address CT scan results. Add low air loss mattress for moisture management and pressure redistribution Continue Prevalon boots for offloading heel pressure injuries Foam to the ulceration on the right posterior calf, change every 3 days. Feeding tube placed by dietician, will need maximized nutrition for wound healing.    [place] nurse will follow along for complex wound care needs.   [place], [place]

## 2016-06-10 NOTE — Progress Notes (Addendum)
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  She was discharged to a SNF.  She had been dialyzing, presumably on MWF.  Upon completion of her HD session on the day of presentation, she was noted to have mental status changes.  It was not clear to the personnel when she was last known normal.  She was transferred to the ED where she had a WBC count of 20, Hgb 9, Ammonia level normal.  Head CT was negative for acute process.  MRI/MRA noted acute right deep gray nuclei and right hippocampal infarcts with an occluded R MCA, Right ICA, and Left vertebral artery.  Subjective: The pt is less responsive today in that she will not follow the examiner w/ her eyes.  She has been febrile this morning w/ Tmax 102.5.  TTE has raised concern for a possible vegetation.  CT of her L leg has revealed what is likely another abscess near the region of her recent I&D.  Gen Surgery has been consulted.  Despite her fever and accompanying tachycardia, she remains otherwise hemodynamically stable at this time w/ preserved BP and saturations.    Assessment & Plan:  Acute metabolic encephalopathy v/s chronic encephalopathy During last admit mental status apparently waxed and waned - Psych saw in consultation and suggested tx for depression - suspect her acute change is due to TME in setting of sepsis related to her L thigh wound and possible SBE   L posterior/medial thigh wound s/p debridement by Len Childs 05/21/16 - VAC tx continues - completed a course of abx tx on 05/31/16 - CT of the thigh has noted a new area c/w an abscess not communicating with  the currently opened area - Gen Surg has been consulted and plans to take her to the OR 06/14/2016 - broad abx coverage to include MRSA coverage has been initiated   SBE - Possible 7x4mm vegetation L coronary cusp  Blood cx during last admit 1 of 6 + for coag neg Staph strongly suggestive of contaminant - no such mass noted on TTE 03/22/16 - will need TEE to investigate when more stable (have not yet called Cards as pt not stable enough for TEE presently)   Acute, multifocal CVAs with history of multiple prior CVAs bedbound at baseline due to prior CVAs - Neurology following - current acute CVAs not felt to be signif enough to explain her encephalopathy   ESRD on HD Nephrology following - for HD today   Reactive RPR Quant f/u RPR 1:1 w/ negative T pallidum abs - ?false negative - will ask ID to comment   Morbid obesity - Body mass index is 41.41 kg/m.  HTN BP well controlled/stable   DM CBG well controlled   Chronic diastolic congestive heart failure TTE 4/28 noted EF 55-60% w/ grade 1 DD  DVT prophylaxis: SCDs Code Status: FULL CODE Family Communication: no family present at time of exam  Disposition Plan: SDU  Consultants:  Neurology  Gen Surgery Nephrology   Procedures: none  Antimicrobials:  Zosyn 4/30 > Vanc 4/30 >  Objective: Blood pressure 129/86, pulse (!) 117, temperature (!) 101.5 F (38.6 C), temperature source  Axillary, resp. rate (!) 29, height  (1.575 m), weight 102.7 kg (226 lb 6.6 oz), SpO2 94 %.  Intake/Output Summary (Last 24 hours) at 06/10/16 1113 Last data filed at 06/09/16 1730  Gross per 24 hour  Intake                0 ml  Output                0 ml  Net                0 ml   Filed Weights   06/08/16 0421 06/09/16 0500  Weight: 105.3 kg (232 lb 2.3 oz) 102.7 kg (226 lb 6.6 oz)    Examination: General: No acute respiratory distress - less responsive today, but only minimally responsive since admit   Lungs: Clear to auscultation  bilaterally without wheeze Cardiovascular: Regular rate and rhythm without murmur appreciable  Abdomen: Obese, soft, bowel sounds present, no organomegaly, no rebound Extremities: No significant cyanosis, clubbing, edema bilateral lower extremities - change of chronic venous statis dermatitis noted B Neurologic: Obtunded, no Babinski - does not withdraw from painful stimuli    CBC:  Recent Labs Lab 06/10/2016 1745 05/13/2016 1804 06/08/16 0448 06/09/16 0258  WBC 20.0*  --  18.1* 18.4*  NEUTROABS 16.4*  --  14.4*  --   HGB 9.1* 10.2* 8.9* 8.1*  HCT 30.3* 30.0* 29.6* 26.4*  MCV 91.0  --  90.2 89.2  PLT 406*  --  419* 357   Basic Metabolic Panel:  Recent Labs Lab 05/23/2016 1745 06/08/2016 1804 06/08/16 0448 06/09/16 0258  NA 136 135 136 137  K 4.2 4.2 4.1 3.8  CL 97* 99* 96* 97*  CO2 23  --  23 25  GLUCOSE 194* 195* 176* 157*  BUN 17 22* 26* 37*  CREATININE 2.37* 2.20* 3.06* 3.99*  CALCIUM 8.7*  --  9.4 9.5  MG  --   --   --  2.2  PHOS  --   --   --  5.0*   GFR: Estimated Creatinine Clearance: 21.2 mL/min (A) (by C-G formula based on SCr of 3.99 mg/dL (H)).  Liver Function Tests:  Recent Labs Lab 06/04/2016 1745 06/09/16 0258  AST 28 17  ALT 30 25  ALKPHOS 113 101  BILITOT 1.0 0.8  PROT 8.5* 8.0  ALBUMIN 2.1* 1.8*    Recent Labs Lab 05/21/2016 1828  AMMONIA 31    HbA1C: Hgb A1c MFr Bld  Date/Time Value Ref Range Status  06/09/2016 02:58 AM 6.7 (H) 4.8 - 5.6 % Final    Comment:    (NOTE)         Pre-diabetes: 5.7 - 6.4         Diabetes: >6.4         Glycemic control for adults with diabetes: <7.0   06/08/2016 04:48 AM 6.6 (H) 4.8 - 5.6 % Final    Comment:    (NOTE)         Pre-diabetes: 5.7 - 6.4         Diabetes: >6.4         Glycemic control for adults with diabetes: <7.0     CBG:  Recent Labs Lab 06/08/16 2341 06/09/16 0320 06/09/16 0804 06/09/16 1116 06/09/16 1615  GLUCAP 128* 149* 154* 164* 141*    Scheduled Meds: . aspirin  300 mg  Rectal Daily  . chlorhexidine  15 mL Mouth Rinse BID  . insulin aspart  0-9  Units Subcutaneous Q4H  . mouth rinse  15 mL Mouth Rinse q12n4p  . metoprolol  2.5 mg Intravenous Q8H  . sodium hypochlorite   Irrigation BID     LOS: 2 days    Lonia Blood, MD [facility] Office  [phone] Pager - Text Page per Loretha Stapler as per below:  On-Call/Text Page:      [email or web address]      password [email or web address]  If 7PM-7AM, please contact night-coverage [email or web address] Password [email or web address] 06/10/2016, 11:13 AM

## 2016-06-10 NOTE — Evaluation (Signed)
Occupational Therapy Treatment Patient Details Name: [name] MRN: [id number] DOB: 09/01/1975 Today's Date: 06/10/2016    History of present illness Patient is a 41 yo female admitted 07-03-16 with AMS from Dialysis Center.  Patient with Rt infarcts, occluded MCA, Rt ICA, and Lt vertebral artery. No verbalizations or voluntary movement.   PMH:  CVA with RLE weak, DM, obesity, chronic lymphedema, wound Rt calf, VAC on Lt thigh, HTN, CHF, depression, ESRD on HD, asthma   OT comments  This 41 yo female admitted with above presents to acute OT with deficits below (see OT problem list) thus affecting her PLOF. She currently is total care and only minimally responsive (to her LLE only)-noxious stimuli. She will benefit from a trial of OT to see if progress can be made to decrease burden of care. Follow up recommendation is SNF.   Follow Up Recommendations  SNF;Supervision/Assistance - 24 hour    Equipment Recommendations  None recommended by OT       Precautions / Restrictions Precautions Precautions: Fall Precaution Comments: bil inner thigh wounds that were surgically debrided in OR Restrictions Weight Bearing Restrictions: No       Mobility Bed Mobility Overal bed mobility: Needs Assistance Bed Mobility: Rolling Rolling: +2 for physical assistance;Total assist         General bed mobility comments: Patient unable to initiate motion to roll, or to follow movements once initiated passively. Pt 0%.   Transfers   Equipment used:  (Needs lift equipment)             General transfer comment: +4 total assist for regular bed to air bed transfer using sheet, no participation from pt        ADL either performed or assessed with clinical judgement   ADL Overall ADL's : Needs assistance/impaired                                       General ADL Comments: total A for all      Vision   Additional Comments: Pt only minimally opening her eyes.  When assisted to open her eyes, her eyes move intermittently but not to any stimuli          Cognition Arousal/Alertness: Lethargic Behavior During Therapy: Flat affect Overall Cognitive Status: Difficult to assess                                 General Comments: pt mainly not responsive to verbal/tactile stimulii, eyes closed throughout session; some reflexive withdrawal of LLE in response to light touch to L foot with possible some purposeful withdrawl to nail bed pressure                   Pertinent Vitals/ Pain       Pain Assessment: Faces Pain Score: 0-No pain Pain Location: withdrew to pain (nail bed pressure) on left foot only  Home Living Family/patient expects to be discharged to:: Skilled nursing facility                                            Frequency  Min 2X/week               Co-evaluation  PT/OT/SLP Co-Evaluation/Treatment: Yes Reason for Co-Treatment: Complexity of the patient's impairments (multi-system involvement);For patient/therapist safety PT goals addressed during session: Mobility/safety with mobility OT goals addressed during session: Strengthening/ROM      AM-PAC PT "6 Clicks" Daily Activity     Outcome Measure   Help from another person eating meals?: Total Help from another person taking care of personal grooming?: Total Help from another person toileting, which includes using toliet, bedpan, or urinal?: Total Help from another person bathing (including washing, rinsing, drying)?: Total Help from another person to put on and taking off regular upper body clothing?: Total Help from another person to put on and taking off regular lower body clothing?: Total 6 Click Score: 6    End of Session    OT Visit Diagnosis: Muscle weakness (generalized) (M62.81);Low vision, both eyes (H54.2);Other symptoms and signs involving the nervous system (R29.898);Other symptoms and signs involving cognitive  function;Cognitive communication deficit (R41.841);Hemiplegia and hemiparesis Symptoms and signs involving cognitive functions: Cerebral infarction Hemiplegia - Right/Left:  (both it appears) Hemiplegia - dominant/non-dominant:  (both it appears) Hemiplegia - caused by: Cerebral infarction   Activity Tolerance Patient limited by lethargy   Patient Left in bed   Nurse Communication  (RN room A'ing [place] with her)        Time: 1610-9604 OT Time Calculation (min): 23 min  Charges: OT General Charges $OT Visit: 1 Procedure OT Evaluation $OT Eval Moderate Complexity: 1 Procedure  Ignacia Palma, OTR/L [phone] 06/10/2016

## 2016-06-10 NOTE — Care Management Note (Addendum)
Case Management Note  Patient Details  Name: [name] MRN: [id number] Date of Birth: Dec 02, 1975  Subjective/Objective:   From [facility] SN[facility], She has presented with altered mental status and left hemiparesism following dialysis and MRI shows small right deep brain infarcts. Recent fever and abnormal echo is concern for endocarditis. For Blood cxs and TEE, conts on abxs, has wound vac in place.  CSW referral for SNF.      5/15 1436 Letha Cape RN,BSN- NCM  Received consult to check with [name] regarding ltach, spoke with [name] rep [place] about this patient, and he states they can not take patient, they do not have any medicaid beds available at this time.             Action/Plan: NCM will cont to follow for dc needs.  Expected Discharge Date:                  Expected Discharge Plan:  Skilled Nursing Facility  In-House Referral:  Clinical Social Work  Discharge planning Services  CM Consult  Post Acute Care Choice:    Choice offered to:     DME Arranged:    DME Agency:     HH Arranged:    HH Agency:     Status of Service:  Completed, signed off  If discussed at [employer], dates discussed:    Additional Comments:  Leone Haven, RN 06/10/2016, 2:58 PM

## 2016-06-10 NOTE — Progress Notes (Signed)
STROKE TEAM PROGRESS NOTE   HISTORY OF PRESENT ILLNESS (per record) [name] is an 41 y.o. female who presented for further assessment of mental status changes occurring after dialysis on Friday. Per EMS, she became unresponsive at the end of her treatment. She arrived to the ED with a GCS of 11 and a CBG of 188. The personnel at the dialysis center were unsure of when she was last known normal.   In the ED her WBC count was noted to be 20. She had no fever but was mildly tachycardic and tachypneic. Ammonia level was normal. ABG and clinical parameters showed no indication for ventilatory support. CT head showed no acute changes, with atrophy and chronic infarctions noted.   MRI/MRA of the head was obtained in the ED, revealing a cluster of 3 small acute ischemic infarctions involving the deep grey nuclei and posterior hippocampus on the right. MRA showed slow flow versus occlusion of the right MCA which was felt to be chronic given prior CT findings. 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 The left M1 segment was noted to be chronically stenotic.   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 Also had a recent admission from 3/21 - 4/20 for encephalopathy.   LSN: Unknown tPA Given: No: Not a tPA candidate given no known time of neurological symptom onset   SUBJECTIVE (INTERVAL HISTORY) No family members present. The patient is more sleepy and less interactive today   not following commands consistently or speaking.She had T max 102.5 and echo shows coronary cusp echodensity possible vegetation. Temp:  [98 F (36.7 C)-102.5 F (39.2 C)] 100.2 F (37.9 C) (04/30 1141) Pulse Rate:  [100-120] 116 (04/30 1100) Cardiac Rhythm: Sinus tachycardia (04/30  0741) Resp:  [12-31] 20 (04/30 1100) BP: (103-143)/(59-95) 115/62 (04/30 1100) SpO2:  [92 %-98 %] 97 % (04/30 1100)  CBC:   Recent Labs Lab 06/10/2016 1745  06/08/16 0448 06/09/16 0258  WBC 20.0*  --  18.1* 18.4*  NEUTROABS 16.4*  --  14.4*  --   HGB 9.1*  < > 8.9* 8.1*  HCT 30.3*  < > 29.6* 26.4*  MCV 91.0  --  90.2 89.2  PLT 406*  --  419* 357  < > = values in this interval not displayed.  Basic Metabolic Panel:   Recent Labs Lab 06/08/16 0448 06/09/16 0258  NA 136 137  K 4.1 3.8  CL 96* 97*  CO2 23 25  GLUCOSE 176* 157*  BUN 26* 37*  CREATININE 3.06* 3.99*  CALCIUM 9.4 9.5  MG  --  2.2  PHOS  --  5.0*    Lipid Panel:     Component Value Date/Time   CHOL 160 06/08/2016 0448   TRIG 146 06/08/2016 0448   HDL 45 06/08/2016 0448   CHOLHDL 3.6 06/08/2016 0448   VLDL 29 06/08/2016 0448   LDLCALC 86 06/08/2016 0448   HgbA1c:  Lab Results  Component Value Date   HGBA1C 6.7 (H) 06/09/2016   Urine Drug Screen:     Component Value Date/Time   LABOPIA NEGATIVE 09/05/2013 1822   LABOPIA NEG 08/26/2012 1251   COCAINSCRNUR NEGATIVE 09/05/2013 1822   LABBENZ NEGATIVE 09/05/2013 1822   AMPHETMU NEGATIVE 09/05/2013 1822    Alcohol Level     Component Value  Date/Time   ETH <5 05/20/2016 1735    IMAGING  Ct Head Wo Contrast 05/25/2016 1. No acute intracranial abnormality identified.  2. Stable chronic basal ganglia and cerebellar hemisphere lacunar infarcts.     Mr Maxine Glenn Head Wo Contrast 05/15/2016  MRI HEAD:  Motion degraded examination. 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 Multiple old infarcts. Moderate parenchymal brain volume loss for age.   MRA HEAD:  Moderately motion degraded examination. Slow flow versus occluded MCA, likely chronic given prior CT findings. Chronically occluded RIGHT internal carotid artery, and LEFT vertebral artery. Chronically stenotic proximal LEFT M1 segment, difficult to quantify. 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     Dg Chest Portable 1 View 05/30/2016 1. No radiopaque metallic foreign object.  2. No acute cardiopulmonary process.  Mild cardiomegaly.  3. Right IJ dialysis catheter with tip at the cavoatrial junction.    EEG 06/08/2016 EEG Abnormalities: 1) generalized irregular slow activity 2) slow PDR Clinical Interpretation: This EEG is consistent with a moderate generalized non-specific cerebral dysfunction(encephalopathy).  There was no seizure or seizure predisposition recorded on this study. Please note that a normal EEG does not preclude the possibility of epilepsy.    PHYSICAL EXAM Middle aged  morbidly obese lady not in distress. . Afebrile. Head is nontraumatic. Neck is supple without bruit.    Cardiac exam no murmur or gallop. Lungs are clear to auscultation. Lower extremity edema with chronic wounds and wound vac on left medial thigh.  Neurological Exam :  Obtunded barely opens eyes but. does not follow any commands.Tries to speak a few words but difficult to understand No volitional movement except for head turning away from noxious.  Cranial Nerves: II:  Blinks to threat bilaterally immediately after sternal rub. Pupils sluggishly reactive.  III,IV, VI: Eyes with mild exotropia and slow roving EOM. Slow roving EOM occur towards the side of noxious stimuli at times.  V,VII: Mildly decreased grimace on left side with noxious, on a somewhat flaccid background.  VIII: no response to questions or commands IX,X: unable to visualize palate XI: turns head to left and right in response to noxious XII: does not follow command for tongue extension Motor/Sensory:  Mildly increased flexor tone RUE. Flickers fingers to noxious on right. Flaccid LUE tone. No movement to noxious. Bilateral lower extremities weakly withdraw to plantar stimulation with 2/5 strength, slightly more briskly on the right.  Deep Tendon Reflexes:  Right  brachioradialis and biceps 2+. Left brachioradialis and biceps 1+. Absent patellar and achilles reflexes bilaterally.  Plantars: Equivocal responses bilaterally.  Cerebellar/Gait: Unable to assess.  ASSESSMENT/PLAN [name] is a 41 y.o. female with history of end-stage renal disease on dialysis, diabetes mellitus, previous strokes, morbid obesity, hypertension, congestive heart failure, multiple pressure ulcers and asthma presenting with unresponsiveness. She did not receive IV t-PA due to unknown time of onset.  Stroke:  Acute subcentimeter RIGHT deep gray nuclei and RIGHT hippocampal infarcts likely small vessel disease with underlying encepaholpathy from renal failure  Resultant  Altered mental status and left hemiplegia  MRI  Acute subcentimeter RIGHT deep gray nuclei and RIGHT hippocampal infarcts  MRA Slow flow versus occluded MCA. Chronically occluded RIGHT internal carotid artery, and LEFT vertebral artery.  EEG - consistent with a moderate generalized non-specific cerebral dysfunction(encephalopathy). See above  Carotid Doppler - will order 2D Echo - There is an echodensity present on the left coronary cusp   measuring 7 x 6 mm. This most  probably represents vegetation   since it wasn&'t present on echocardiogram on 03/22/2016. Further    evaluation with TEE is recommended  LDL - 86  HgbA1c 6.7  VTE prophylaxis - SCDs Diet NPO time specified  No antithrombotic prior to admission, now on aspirin 300 mg suppository daily  Patient will be counseled to be compliant with her antithrombotic medications  Ongoing aggressive stroke risk factor management  Therapy recommendations: pending  Disposition: Pending  Hypertension  Blood pressure tends to run low  Permissive hypertension (OK if < 220/120) but gradually normalize in 5-7 days  Long-term BP goal normotensive  Hyperlipidemia  Home meds: Lipitor 20 mg daily not resumed  LDL 86, goal <  70  Increase Lipitor to 40 mg daily when PO access is available  Continue statin at discharge  Diabetes  HgbA1c pending, goal < 7.0  Unc / Controlled  Other Stroke Risk Factors  Obesity, Body mass index is 41.41 kg/m., recommend weight loss, diet and exercise as appropriate   Hx stroke/TIA   Other Active Problems  Leukocytosis  Anemia  End-stage renal disease  Multiple pressure and skin ulcers  History of encephalopathy  Hospital day # 2   I have personally examined this patient, reviewed notes, independently viewed imaging studies, participated in medical decision making and plan of care.ROS completed by me personally and pertinent positives fully documented  I have made any additions or clarifications directly to the above note. .  She has presented with altered mental status and left hemiparesism following dialysis and MRI shows small right deep brain infarcts but exam appears disproportionate to size of her infarcts and suspect underlying encephalopathy as well. Recent fever and abnormal echo raise concern for endocarditis. Recommend TEE and blood cultures and antibiotics.Continue to follow   No family available at  bedside for discussion.D/w Dr Dolphus Jenny and answered questions.   Delia Heady, MD Medical Director [facility] Pager: [phone] 06/10/2016 1:13 PM  To contact Stroke Continuity provider, please refer to [email or web address]. After hours, contact General Neurology

## 2016-06-10 NOTE — Progress Notes (Signed)
Initial Nutrition Assessment  DOCUMENTATION CODES:   Morbid obesity  INTERVENTION:    Recommend start TF via Cortrak tube with Nepro at 20 ml/h, increase by 10 ml every 4 hours to goal rate of 40 ml/h with Pro-stat 30 ml TID to provide 2028 kcal, 123 gm protein, 698 ml free water daily.  NUTRITION DIAGNOSIS:   Inadequate oral intake related to lethargy/confusion as evidenced by NPO status.  GOAL:   Patient will meet greater than or equal to 90% of their needs  MONITOR:   Diet advancement, PO intake, Weight trends, I & O's, TF tolerance  REASON FOR ASSESSMENT:   Malnutrition Screening Tool    ASSESSMENT:   40 y.o.woman (SNF resident) with PMH of CVA, DM2, morbid obesity, chronic lymphedema, HTN, chronic diastolic heart failure, depression, ESRD on HD, and infected leg ulcer. Admitted on 4/27 after her HD session with mental status changes.    Unable to speak with patient or complete nutrition focused physical exam. Patient is in HD. SLP unable to complete swallow evaluation today due to patient's decreased LOC.  Cortrak tube placed today (tip in the gastric pylorus or proximal duodenum). WOC RN following for L thigh abscess. Plans for I&D in the OR tomorrow. Labs reviewed: potassium 3.2 (L) Medications reviewed.  Diet Order:  Diet NPO time specified  Skin:  L thigh abscess; wounds to R leg, sacrum, stg 2 buttocks; DTI R heel; unstageable L heel  Last BM:  4/30  Height:   Ht Readings from Last 1 Encounters:  06/09/16  (1.575 m)    Weight:   Wt Readings from Last 1 Encounters:  06/09/16 226 lb 6.6 oz (102.7 kg)    Ideal Body Weight:  50 kg  BMI:  Body mass index is 41.41 kg/m.  Estimated Nutritional Needs:   Kcal:  1900-2100  Protein:  110-130 gm  Fluid:  >/= 2.1 L  EDUCATION NEEDS:   No education needs identified at this time  Joaquin Courts, RD, LDN, CNSC Pager [phone] After Hours Pager [phone]

## 2016-06-10 NOTE — Progress Notes (Signed)
Pharmacy Antibiotic Note  [name] is a 41 y.o. female admitted on 05/19/2016 with sepsis, possible vegetation seen on ECHO. Patient has ESRD, dialyzes MWF  Plan: Zosyn 3.375 gm iv q12h Vancomycin 2250 mg x 1 then 1 g with HD Monitor cx, TEE results, HD schedule, VR prn  Height:  (157.5 cm) Weight: 226 lb 6.6 oz (102.7 kg) IBW/kg (Calculated) : 50.1  Temp (24hrs), Avg:99.8 F (37.7 C), Min:98 F (36.7 C), Max:102.5 F (39.2 C)   Recent Labs Lab 05/15/2016 1745 05/15/2016 1804 06/08/16 0047 06/08/16 0448 06/09/16 0258  WBC 20.0*  --   --  18.1* 18.4*  CREATININE 2.37* 2.20*  --  3.06* 3.99*  LATICACIDVEN  --   --  2.0* 1.5  --     Estimated Creatinine Clearance: 21.2 mL/min (A) (by C-G formula based on SCr of 3.99 mg/dL (H)).    Allergies  Allergen Reactions  . Azithromycin Other (See Comments)    Nose bleeding event   [name], PharmD, BCPS, BCCCP Clinical Pharmacist Clinical phone for 06/10/2016 from 7a-3:30p: [phone] If after 3:30p, please call main pharmacy at: [phone] 06/10/2016 8:50 AM

## 2016-06-10 NOTE — Progress Notes (Signed)
[facility] for Infectious Disease  Date of Admission:  05/16/2016  Date of Consult:  06/10/2016  Reason for Consult: + RPR, Fever, Concern re: IE Referring Physician: Thereasa Solo  Impression/Recommendation:  Possible IE -recent acute multifocal CVAs and fever -BCx 4/30 >> await results  -Empiric coverage with Vanc/Zosyn started today 4/30. Would continue for now.  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  -Follow up with state lab tomorrow [phone]) .    Thank you so much for this interesting consult,   Janene Madeira (pager) [phone] [email or web address]  [name] is an 41 y.o. female.  HPI: 41 yo woman with history of CVA, DM2, morbid obesity, chronic lymphadema, HTN, ESRD w/ HD, CHF, infected leg ulcer (WCx on 4/10 with multiple pathogens + Beta-lactamase present). She previously was a resident at [facility] where she is chronically bedbound d/t previous CVAs, presented to ED 4/27 with significant AMS after HD treatment. WBC count found to be 20k. This morning with fever Tmax 102.5. TTE could not rule out vegetation. BCx drawn and pending from today. Started on vanco/zosyn.   Past Medical History:  Diagnosis Date  . Anasarca 02/05/2015  . Asthma   . CHF (congestive heart failure) (HCC)    diastolic  . CVA (cerebral infarction) 1990's   "writing is not the same since" R leg weakness   . GERD (gastroesophageal reflux disease)   . Hypertension   . Morbid obesity ([place])   . Palliative care encounter   . Stroke ([place])   . Type II diabetes mellitus ([place])     Past Surgical History:  Procedure Laterality Date  . AV FISTULA PLACEMENT Left 05/14/2016   Procedure: ARTERIOVENOUS (AV) LEFT UPPER ARM USING GORETEX  STRETCHED VASCULAR GRAFT;  Surgeon: Angelia Mould, MD;  Location: [place];  Service: Vascular;  Laterality: Left;  . INCISION AND DRAINAGE OF WOUND Left 08/30/2013   thigh necrotic wound/notes 08/30/2013  . IR GENERIC HISTORICAL  05/07/2016   IR FLUORO GUIDE CV LINE RIGHT 05/07/2016 Markus Daft, MD [facility]  . IR GENERIC HISTORICAL  05/07/2016   IR US GUIDE VASC ACCESS RIGHT 05/07/2016 Markus Daft, MD [facility]  . IRRIGATION AND DEBRIDEMENT ABSCESS Left 08/30/2013   Procedure: IRRIGATION AND DEBRIDEMENT ABSCESS Left Thigh Necrotic Wound;  Surgeon: Ralene Ok, MD;  Location: [place];  Service: General;  Laterality: Left;  . TEE WITHOUT CARDIOVERSION N/A 09/07/2013   Procedure: TRANSESOPHAGEAL ECHOCARDIOGRAM (TEE);  Surgeon: Pixie Casino, MD;  Location: [facility];  Service: Cardiovascular;  Laterality: N/A;  . TEE WITHOUT CARDIOVERSION N/A 02/08/2015   Procedure: TRANSESOPHAGEAL ECHOCARDIOGRAM (TEE);  Surgeon: Skeet Latch, MD;  Location: [place];  Service: Cardiovascular;  Laterality: N/A;  . WOUND DEBRIDEMENT Left 05/21/2016   Procedure: DEBRIDEMENT LEFT THIGH WOUND;  Surgeon: Rolm Bookbinder, MD;  Location: [place];  Service: General;  Laterality: Left;     Allergies  Allergen Reactions  . Azithromycin Other (See Comments)    Nose bleeding event    Medications:  Scheduled: . aspirin  300 mg Rectal Daily  . chlorhexidine  15 mL Mouth Rinse BID  . insulin aspart  0-9 Units Subcutaneous Q4H  . mouth rinse  15 mL Mouth Rinse q12n4p  . metoprolol  2.5 mg Intravenous Q8H  . [START ON 06/23/2016] pneumococcal 23 valent vaccine  0.5 mL Intramuscular Tomorrow-1000  . sodium hypochlorite   Irrigation BID    Abtx:  Anti-infectives    Start     Dose/Rate Route Frequency Ordered Stop   06/10/16 1200  vancomycin (VANCOCIN) IVPB 1000 mg/200 mL premix     1,000 mg 200 mL/hr over 60 Minutes Intravenous Every M-W-F (Hemodialysis) 06/10/16 0851     06/10/16 1000   piperacillin-tazobactam (ZOSYN) IVPB 3.375 g     3.375 g 12.5 mL/hr over 240 Minutes Intravenous Every 12 hours 06/10/16 0851     06/10/16 1000  vancomycin (VANCOCIN) 2,250 mg in sodium chloride 0.9 % 500 mL IVPB  Status:  Discontinued     2,250 mg 250 mL/hr over 120 Minutes Intravenous Every 12 hours 06/10/16 0851 06/10/16 0858   06/10/16 1000  vancomycin (VANCOCIN) 2,250 mg in sodium chloride 0.9 % 500 mL IVPB     2,250 mg 250 mL/hr over 120 Minutes Intravenous  Once 06/10/16 0858        Total days of antibiotics: 1 zosyn + vanc          Social History:  reports that she has never smoked. She has never used smokeless tobacco. She reports that she does not drink alcohol or use drugs.  Family History  Problem Relation Age of Onset  . Diabetes Mother   . Kidney disease Mother     History obtained from chart review and unobtainable from patient due to mental status  Blood pressure 117/89, pulse (!) 112, temperature 98.5 F (36.9 C), temperature source Axillary, resp. rate 19, height '5\' 2"'$  (1.575 m), weight 226 lb 6.6 oz (102.7 kg), SpO2 99 %.   General appearance: mild distress and Non-verbal, does not make eye-contact to voice Eyes: conjunctivae/corneas clear. PERRL, EOM's intact. Fundi benign. Lungs: clear to auscultation bilaterally and tachypneic Heart: regular rate and rhythm, S1, S2 normal, no murmur, click, rub or gallop Abdomen: soft, non-tender; bowel sounds normal; no masses,  no organomegaly Extremities: Trace edema b/l LEs Skin: Skin color, texture, turgor normal. No rashes or lesions or Deep full thickness wound to L inner thigh with strong odor and drainage. B/L DTI to heels.  Neurologic: Mental status: Alertness: Non-verbal, responds to painful stimuli, no purposeful movements otherwise      Results for orders placed or performed during the hospital encounter of 05/12/2016 (from the past 48 hour(s))  Glucose, capillary     Status: Abnormal   Collection Time:  06/08/16  7:30 PM  Result Value Ref Range   Glucose-Capillary 152 (H) 65 - 99 mg/dL   Comment 1 Capillary Specimen    Comment 2 Notify RN   Glucose, capillary     Status: Abnormal   Collection Time: 06/08/16 11:41 PM  Result Value Ref Range   Glucose-Capillary 128 (H) 65 - 99 mg/dL   Comment 1 Capillary Specimen    Comment 2 Notify RN   Comprehensive metabolic panel     Status: Abnormal   Collection Time: 06/09/16  2:58 AM  Result Value Ref Range   Sodium 137 135 - 145 mmol/L   Potassium 3.8 3.5 - 5.1 mmol/L   Chloride 97 (L) 101 - 111 mmol/L   CO2 25 22 - 32 mmol/L   Glucose, Bld 157 (H) 65 - 99 mg/dL   BUN 37 (H) 6 - 20 mg/dL   Creatinine, Ser 3.99 (H) 0.44 - 1.00 mg/dL   Calcium 9.5  8.9 - 10.3 mg/dL   Total Protein 8.0 6.5 - 8.1 g/dL   Albumin 1.8 (L) 3.5 - 5.0 g/dL   AST 17 15 - 41 U/L   ALT 25 14 - 54 U/L   Alkaline Phosphatase 101 38 - 126 U/L   Total Bilirubin 0.8 0.3 - 1.2 mg/dL   GFR calc non Af Amer 13 (L) >60 mL/min   GFR calc Af Amer 15 (L) >60 mL/min    Comment: (NOTE) The eGFR has been calculated using the CKD EPI equation. This calculation has not been validated in all clinical situations. eGFR's persistently <60 mL/min signify possible Chronic Kidney Disease.    Anion gap 15 5 - 15  CBC     Status: Abnormal   Collection Time: 06/09/16  2:58 AM  Result Value Ref Range   WBC 18.4 (H) 4.0 - 10.5 K/uL   RBC 2.96 (L) 3.87 - 5.11 MIL/uL   Hemoglobin 8.1 (L) 12.0 - 15.0 g/dL   HCT 26.4 (L) 36.0 - 46.0 %   MCV 89.2 78.0 - 100.0 fL   MCH 27.4 26.0 - 34.0 pg   MCHC 30.7 30.0 - 36.0 g/dL   RDW 17.8 (H) 11.5 - 15.5 %   Platelets 357 150 - 400 K/uL  Magnesium     Status: None   Collection Time: 06/09/16  2:58 AM  Result Value Ref Range   Magnesium 2.2 1.7 - 2.4 mg/dL  Phosphorus     Status: Abnormal   Collection Time: 06/09/16  2:58 AM  Result Value Ref Range   Phosphorus 5.0 (H) 2.5 - 4.6 mg/dL  Hemoglobin A1c     Status: Abnormal   Collection Time:  06/09/16  2:58 AM  Result Value Ref Range   Hgb A1c MFr Bld 6.7 (H) 4.8 - 5.6 %    Comment: (NOTE)         Pre-diabetes: 5.7 - 6.4         Diabetes: >6.4         Glycemic control for adults with diabetes: <7.0    Mean Plasma Glucose 146 mg/dL    Comment: (NOTE) Performed At: [facility] [address] [place], [place] [id number] Lindon Romp MD WN:[id number]   Glucose, capillary     Status: Abnormal   Collection Time: 06/09/16  3:20 AM  Result Value Ref Range   Glucose-Capillary 149 (H) 65 - 99 mg/dL   Comment 1 Capillary Specimen    Comment 2 Notify RN   Glucose, capillary     Status: Abnormal   Collection Time: 06/09/16  8:04 AM  Result Value Ref Range   Glucose-Capillary 154 (H) 65 - 99 mg/dL   Comment 1 Capillary Specimen   Glucose, capillary     Status: Abnormal   Collection Time: 06/09/16 11:16 AM  Result Value Ref Range   Glucose-Capillary 164 (H) 65 - 99 mg/dL   Comment 1 Capillary Specimen   Glucose, capillary     Status: Abnormal   Collection Time: 06/09/16  4:15 PM  Result Value Ref Range   Glucose-Capillary 141 (H) 65 - 99 mg/dL   Comment 1 Capillary Specimen   Glucose, capillary     Status: Abnormal   Collection Time: 06/10/16 11:40 AM  Result Value Ref Range   Glucose-Capillary 139 (H) 65 - 99 mg/dL   Comment 1 Notify RN   Culture, blood (Routine X 2) w Reflex to ID Panel     Status: None (Preliminary result)  Collection Time: 06/10/16 12:30 PM  Result Value Ref Range   Specimen Description BLOOD RIGHT HEMODIALYSIS CATHETER    Special Requests      BOTTLES DRAWN AEROBIC AND ANAEROBIC Blood Culture results may not be optimal due to an excessive volume of blood received in culture bottles   Culture PENDING    Report Status PENDING   Renal function panel     Status: Abnormal   Collection Time: 06/10/16  1:00 PM  Result Value Ref Range   Sodium 139 135 - 145 mmol/L   Potassium 3.2 (L) 3.5 - 5.1 mmol/L   Chloride 100 (L) 101 - 111 mmol/L    CO2 26 22 - 32 mmol/L   Glucose, Bld 123 (H) 65 - 99 mg/dL   BUN 30 (H) 6 - 20 mg/dL   Creatinine, Ser 6.50 (H) 0.44 - 1.00 mg/dL   Calcium 8.9 8.9 - 81.3 mg/dL   Phosphorus 2.9 2.5 - 4.6 mg/dL   Albumin 1.7 (L) 3.5 - 5.0 g/dL   GFR calc non Af Amer 17 (L) >60 mL/min   GFR calc Af Amer 20 (L) >60 mL/min    Comment: (NOTE) The eGFR has been calculated using the CKD EPI equation. This calculation has not been validated in all clinical situations. eGFR's persistently <60 mL/min signify possible Chronic Kidney Disease.    Anion gap 13 5 - 15  CBC     Status: Abnormal   Collection Time: 06/10/16  1:49 PM  Result Value Ref Range   WBC 17.7 (H) 4.0 - 10.5 K/uL   RBC 2.92 (L) 3.87 - 5.11 MIL/uL   Hemoglobin 7.9 (L) 12.0 - 15.0 g/dL   HCT 65.3 (L) 11.3 - 94.9 %   MCV 89.7 78.0 - 100.0 fL   MCH 27.1 26.0 - 34.0 pg   MCHC 30.2 30.0 - 36.0 g/dL   RDW 14.8 (H) 65.4 - 68.9 %   Platelets 323 150 - 400 K/uL      Component Value Date/Time   SDES BLOOD RIGHT HEMODIALYSIS CATHETER 06/10/2016 1230   SPECREQUEST  06/10/2016 1230    BOTTLES DRAWN AEROBIC AND ANAEROBIC Blood Culture results may not be optimal due to an excessive volume of blood received in culture bottles   CULT PENDING 06/10/2016 1230   REPTSTATUS PENDING 06/10/2016 1230   Dg Abd 1 View  Result Date: 06/10/2016 CLINICAL DATA:  Feeding tube placement. EXAM: ABDOMEN - 1 VIEW COMPARISON:  CT 05/19/2016. FINDINGS: 1039 hours. Feeding tube is looped in the stomach with the tip projecting over the gastric pylorus or proximal duodenum. The visualized bowel gas pattern is nonobstructive. IMPRESSION: Feeding tube tip projects over the gastric pylorus or proximal duodenum. Electronically Signed   By: Carey Bullocks M.D.   On: 06/10/2016 10:51   Ct Femur Left Wo Contrast  Result Date: 06/09/2016 CLINICAL DATA:  Left posterior thigh wound s/p debridement. Now has climbing WBC and low grade fever as well as concern for endocarditis. EXAM:  CT OF THE LEFT FEMUR WITHOUT CONTRAST CT OF THE LEFT TIBIA AND FIBULA WITHOUT CONTRAST TECHNIQUE: Multidetector CT imaging of the left tibia and fibula was performed according to the standard protocol. Multidetector CT imaging of the left femur was performed according to the standard protocol. COMPARISON:  None. FINDINGS: Bones/Joint/Cartilage No acute fracture or dislocation of the left femur. No acute fracture or dislocation of the left tibia or fibula. No periosteal reaction or bone destruction. No aggressive lytic or sclerotic osseous lesion. Normal alignment. No  joint effusion. Tricompartmental mild osteoarthritis of the left knee with small marginal osteophytes. Mild osteoarthritis of the tibiotalar joint. Mild osteoarthritis of the talonavicular joint. Mild enthesopathic changes of the Achilles tendon insertion. Tiny plantar calcaneal spur. Generalized osteopenia. Ligaments Ligaments are suboptimally evaluated by CT. ACL and PCL are grossly intact. Muscles and Tendons Generalized muscle atrophy throughout the thigh and lower leg. Intact quadriceps tendon and patellar tendon. Flexor, extensor, peroneal and Achilles tendons are grossly intact. No intramuscular fluid collection or hematoma. Soft tissue Soft tissue wound in the upper medial left thigh. 1.8 x 3.6 x 4.2 cm complex fluid collection underlying the wound within the subcutaneous fat with surrounding fat stranding most concerning for an abscess or hematoma. Soft tissue wound along the inferior medial left thigh with a wound-vac present. Surrounding skin thickening and fat stranding within the subcutaneous fat most concerning for cellulitis. No soft tissue mass. Extensive peripheral vascular atherosclerotic disease. IMPRESSION: 1. Soft tissue wound along the inferior medial left thigh with a wound-vac present. Surrounding skin thickening and fat stranding within the subcutaneous fat most concerning for cellulitis. No adjacent fluid collection to suggest  an abscess. 2. Soft tissue wound in the upper medial left thigh. 1.8 x 3.6 x 4.2 cm complex fluid collection underlying the wound within the subcutaneous fat with surrounding fat stranding most concerning for an abscess or hematoma. This area is just inferior to the inguinal region. 3. No acute osseous abnormality of the left femur, left tibia or left fibula. 4. Mild tricompartmental osteoarthritis of the left knee. 5. Mild osteoarthritis of the tibiotalar joint and talonavicular joint. Electronically Signed   By: Elige Ko   On: 06/09/2016 17:26   Ct Tibia Fibula Left Wo Contrast  Result Date: 06/09/2016 CLINICAL DATA:  Left posterior thigh wound s/p debridement. Now has climbing WBC and low grade fever as well as concern for endocarditis. EXAM: CT OF THE LEFT FEMUR WITHOUT CONTRAST CT OF THE LEFT TIBIA AND FIBULA WITHOUT CONTRAST TECHNIQUE: Multidetector CT imaging of the left tibia and fibula was performed according to the standard protocol. Multidetector CT imaging of the left femur was performed according to the standard protocol. COMPARISON:  None. FINDINGS: Bones/Joint/Cartilage No acute fracture or dislocation of the left femur. No acute fracture or dislocation of the left tibia or fibula. No periosteal reaction or bone destruction. No aggressive lytic or sclerotic osseous lesion. Normal alignment. No joint effusion. Tricompartmental mild osteoarthritis of the left knee with small marginal osteophytes. Mild osteoarthritis of the tibiotalar joint. Mild osteoarthritis of the talonavicular joint. Mild enthesopathic changes of the Achilles tendon insertion. Tiny plantar calcaneal spur. Generalized osteopenia. Ligaments Ligaments are suboptimally evaluated by CT. ACL and PCL are grossly intact. Muscles and Tendons Generalized muscle atrophy throughout the thigh and lower leg. Intact quadriceps tendon and patellar tendon. Flexor, extensor, peroneal and Achilles tendons are grossly intact. No intramuscular  fluid collection or hematoma. Soft tissue Soft tissue wound in the upper medial left thigh. 1.8 x 3.6 x 4.2 cm complex fluid collection underlying the wound within the subcutaneous fat with surrounding fat stranding most concerning for an abscess or hematoma. Soft tissue wound along the inferior medial left thigh with a wound-vac present. Surrounding skin thickening and fat stranding within the subcutaneous fat most concerning for cellulitis. No soft tissue mass. Extensive peripheral vascular atherosclerotic disease. IMPRESSION: 1. Soft tissue wound along the inferior medial left thigh with a wound-vac present. Surrounding skin thickening and fat stranding within the subcutaneous fat most concerning for  cellulitis. No adjacent fluid collection to suggest an abscess. 2. Soft tissue wound in the upper medial left thigh. 1.8 x 3.6 x 4.2 cm complex fluid collection underlying the wound within the subcutaneous fat with surrounding fat stranding most concerning for an abscess or hematoma. This area is just inferior to the inguinal region. 3. No acute osseous abnormality of the left femur, left tibia or left fibula. 4. Mild tricompartmental osteoarthritis of the left knee. 5. Mild osteoarthritis of the tibiotalar joint and talonavicular joint. Electronically Signed   By: Kathreen Devoid   On: 06/09/2016 17:26   Recent Results (from the past 240 hour(s))  Culture, blood (Routine X 2) w Reflex to ID Panel     Status: None (Preliminary result)   Collection Time: 06/10/16 12:30 PM  Result Value Ref Range Status   Specimen Description BLOOD RIGHT HEMODIALYSIS CATHETER  Final   Special Requests   Final    BOTTLES DRAWN AEROBIC AND ANAEROBIC Blood Culture results may not be optimal due to an excessive volume of blood received in culture bottles   Culture PENDING  Incomplete   Report Status PENDING  Incomplete      06/10/2016, 5:04 PM     LOS: 2 days    Records and images were personally reviewed where  available.

## 2016-06-10 NOTE — Progress Notes (Signed)
[facility] Progress Note   Subjective: not responding this morning.  BP 120's, HR 117, tmax 102 deg.   Vitals:   06/10/16 0349 06/10/16 0709 06/10/16 0833 06/10/16 0837  BP: 120/68 130/64  109/81  Pulse: (!) 114 (!) 115 (!) 117 (!) 116  Resp: (!) 31 16 (!) 22 20  Temp: 99.9 F (37.7 C) (!) 102.5 F (39.2 C) (!) 101.5 F (38.6 C)   TempSrc: Axillary Axillary Axillary   SpO2: 92% 98% 94% 97%  Weight:      Height:        Inpatient medications: . aspirin  300 mg Rectal Daily  . chlorhexidine  15 mL Mouth Rinse BID  . insulin aspart  0-9 Units Subcutaneous Q4H  . mouth rinse  15 mL Mouth Rinse q12n4p  . metoprolol  2.5 mg Intravenous Q8H   . piperacillin-tazobactam (ZOSYN)  IV    . vancomycin    . vancomycin     [DISCONTINUED] acetaminophen **OR** [DISCONTINUED] acetaminophen (TYLENOL) oral liquid 160 mg/5 mL **OR** acetaminophen  Exam: Not responding, obese, temporal m. Wasting No jvd Chest clear bilat RRR tachy no RG ABd obese soft nondistended Ext wound VAC L thigh, some dark blistering superior to wound, no edema; RLE no edema   Dialysis:  [facility]  4:30 hours, 400/800  105.5kg   3K/2Ca   TDC R IJ/ maturing LUA AVG  Hep 4400 - Mircera IV q 2 weeks (last given 4/25) - Calcitriol 0.66mcg PO q HD  Assessment: 1.  Acute CVAs: Plan per neuro and intensivist. 2.  AMS: poorly responsive and new fevers , may be septic 3.  Abnormal ECHO: Possible vegetation on L coronary cusp. TEE planned. 1/6 +bcx's for CNSS, felt to be contaminant most likely.   4.  ESRD: MWF HD 5.  Hypertension/volume: BP controlled, but volume excess. UF as tolerated. 6.  Anemia: Hgb 8.1. Not due for ESA yet, follow closely. 7.  Metabolic bone disease: CorrCa high, Phos ok. Holding VDRA for now. No binders since not eating currently. 8.  Nutrition: Alb very low.  9.  L thigh wound: Per primary team; wound vac in place.  Plan - HD today, no UF   Linda Moselle MD [facility] pager [phone]   06/10/2016, 9:18 AM    Recent Labs Lab Jun 29, 2016 1745 2016/06/29 1804 06/08/16 0448 06/09/16 0258  NA 136 135 136 137  K 4.2 4.2 4.1 3.8  CL 97* 99* 96* 97*  CO2 23  --  23 25  GLUCOSE 194* 195* 176* 157*  BUN 17 22* 26* 37*  CREATININE 2.37* 2.20* 3.06* 3.99*  CALCIUM 8.7*  --  9.4 9.5  PHOS  --   --   --  5.0*    Recent Labs Lab 2016-06-29 1745 06/09/16 0258  AST 28 17  ALT 30 25  ALKPHOS 113 101  BILITOT 1.0 0.8  PROT 8.5* 8.0  ALBUMIN 2.1* 1.8*    Recent Labs Lab 06/29/16 1745 Jun 29, 2016 1804 06/08/16 0448 06/09/16 0258  WBC 20.0*  --  18.1* 18.4*  NEUTROABS 16.4*  --  14.4*  --   HGB 9.1* 10.2* 8.9* 8.1*  HCT 30.3* 30.0* 29.6* 26.4*  MCV 91.0  --  90.2 89.2  PLT 406*  --  419* 357   Iron/TIBC/Ferritin/ %Sat    Component Value Date/Time   IRON 32 05/30/2016 1002   TIBC NOT CALCULATED 05/30/2016 1002   FERRITIN 3,194 (H) 05/30/2016 1002   IRONPCTSAT  NOT CALCULATED 05/30/2016 1002

## 2016-06-10 NOTE — Progress Notes (Signed)
[facility] Progress Note     Subjective: CC: left thigh wound Pt re-admitted with AMS 4/28 and workup significant for multifocal CVA. CT scan revealed 1.8 x 3.6 x 4.2 cm fluid collection under previously drained left thigh abscess. On exam today patients eyes spontaneously open but she is not following commands.  TMAX 102.5  Objective: Vital signs in last 24 hours: Temp:  [98 F (36.7 C)-102.5 F (39.2 C)] 101.5 F (38.6 C) (04/30 0833) Pulse Rate:  [100-118] 116 (04/30 0837) Resp:  [11-31] 20 (04/30 0837) BP: (103-143)/(59-95) 109/81 (04/30 0837) SpO2:  [92 %-98 %] 97 % (04/30 0837) Last BM Date: 06/10/16  Intake/Output from previous day: No intake/output data recorded. Intake/Output this shift: No intake/output data recorded.  PE: Gen:  Alert, NAD, pleasant Card:  Regular rate and rhythm Pulm:  Normal effort Abd: Soft, obese Left thigh: 14 x 10 x 1 cm wound with some undermining of proximal aspect of wound.    Skin: warm and dry, no rashes  Psych: A&Ox3   Lab Results:   Recent Labs  06/08/16 0448 06/09/16 0258  WBC 18.1* 18.4*  HGB 8.9* 8.1*  HCT 29.6* 26.4*  PLT 419* 357   BMET  Recent Labs  06/08/16 0448 06/09/16 0258  NA 136 137  K 4.1 3.8  CL 96* 97*  CO2 23 25  GLUCOSE 176* 157*  BUN 26* 37*  CREATININE 3.06* 3.99*  CALCIUM 9.4 9.5   PT/INR No results for input(s): LABPROT, INR in the last 72 hours. CMP     Component Value Date/Time   NA 137 06/09/2016 0258   NA 137 09/16/2013 1301   K 3.8 06/09/2016 0258   K 4.7 09/16/2013 1301   CL 97 (L) 06/09/2016 0258   CL 108 (H) 09/16/2013 1301   CO2 25 06/09/2016 0258   CO2 24 09/16/2013 1301   GLUCOSE 157 (H) 06/09/2016 0258   GLUCOSE 125 (H) 09/16/2013 1301   BUN 37 (H) 06/09/2016 0258   BUN 15 09/16/2013 1301   CREATININE 3.99 (H) 06/09/2016 0258   CREATININE 1.79 (H) 09/16/2013 1301   CALCIUM 9.5 06/09/2016 0258   CALCIUM 8.3 (L) 09/16/2013 1301   PROT 8.0 06/09/2016  0258   PROT 7.0 09/16/2013 1301   ALBUMIN 1.8 (L) 06/09/2016 0258   ALBUMIN 1.3 (L) 09/16/2013 1301   AST 17 06/09/2016 0258   AST 10 (L) 09/16/2013 1301   ALT 25 06/09/2016 0258   ALT 11 (L) 09/16/2013 1301   ALKPHOS 101 06/09/2016 0258   ALKPHOS 59 09/16/2013 1301   BILITOT 0.8 06/09/2016 0258   BILITOT 0.2 09/16/2013 1301   GFRNONAA 13 (L) 06/09/2016 0258   GFRNONAA 36 (L) 09/16/2013 1301   GFRAA 15 (L) 06/09/2016 0258   GFRAA 42 (L) 09/16/2013 1301   Lipase  No results found for: LIPASE     Studies/Results: Ct Femur Left Wo Contrast  Result Date: 06/09/2016 CLINICAL DATA:  Left posterior thigh wound s/p debridement. Now has climbing WBC and low grade fever as well as concern for endocarditis. EXAM: CT OF THE LEFT FEMUR WITHOUT CONTRAST CT OF THE LEFT TIBIA AND FIBULA WITHOUT CONTRAST TECHNIQUE: Multidetector CT imaging of the left tibia and fibula was performed according to the standard protocol. Multidetector CT imaging of the left femur was performed according to the standard protocol. COMPARISON:  None. FINDINGS: Bones/Joint/Cartilage No acute fracture or dislocation of the left femur. No acute fracture or dislocation of the left tibia or fibula. No  periosteal reaction or bone destruction. No aggressive lytic or sclerotic osseous lesion. Normal alignment. No joint effusion. Tricompartmental mild osteoarthritis of the left knee with small marginal osteophytes. Mild osteoarthritis of the tibiotalar joint. Mild osteoarthritis of the talonavicular joint. Mild enthesopathic changes of the Achilles tendon insertion. Tiny plantar calcaneal spur. Generalized osteopenia. Ligaments Ligaments are suboptimally evaluated by CT. ACL and PCL are grossly intact. Muscles and Tendons Generalized muscle atrophy throughout the thigh and lower leg. Intact quadriceps tendon and patellar tendon. Flexor, extensor, peroneal and Achilles tendons are grossly intact. No intramuscular fluid collection or  hematoma. Soft tissue Soft tissue wound in the upper medial left thigh. 1.8 x 3.6 x 4.2 cm complex fluid collection underlying the wound within the subcutaneous fat with surrounding fat stranding most concerning for an abscess or hematoma. Soft tissue wound along the inferior medial left thigh with a wound-vac present. Surrounding skin thickening and fat stranding within the subcutaneous fat most concerning for cellulitis. No soft tissue mass. Extensive peripheral vascular atherosclerotic disease. IMPRESSION: 1. Soft tissue wound along the inferior medial left thigh with a wound-vac present. Surrounding skin thickening and fat stranding within the subcutaneous fat most concerning for cellulitis. No adjacent fluid collection to suggest an abscess. 2. Soft tissue wound in the upper medial left thigh. 1.8 x 3.6 x 4.2 cm complex fluid collection underlying the wound within the subcutaneous fat with surrounding fat stranding most concerning for an abscess or hematoma. This area is just inferior to the inguinal region. 3. No acute osseous abnormality of the left femur, left tibia or left fibula. 4. Mild tricompartmental osteoarthritis of the left knee. 5. Mild osteoarthritis of the tibiotalar joint and talonavicular joint. Electronically Signed   By: Elige Ko   On: 06/09/2016 17:26   Ct Tibia Fibula Left Wo Contrast  Result Date: 06/09/2016 CLINICAL DATA:  Left posterior thigh wound s/p debridement. Now has climbing WBC and low grade fever as well as concern for endocarditis. EXAM: CT OF THE LEFT FEMUR WITHOUT CONTRAST CT OF THE LEFT TIBIA AND FIBULA WITHOUT CONTRAST TECHNIQUE: Multidetector CT imaging of the left tibia and fibula was performed according to the standard protocol. Multidetector CT imaging of the left femur was performed according to the standard protocol. COMPARISON:  None. FINDINGS: Bones/Joint/Cartilage No acute fracture or dislocation of the left femur. No acute fracture or dislocation of the  left tibia or fibula. No periosteal reaction or bone destruction. No aggressive lytic or sclerotic osseous lesion. Normal alignment. No joint effusion. Tricompartmental mild osteoarthritis of the left knee with small marginal osteophytes. Mild osteoarthritis of the tibiotalar joint. Mild osteoarthritis of the talonavicular joint. Mild enthesopathic changes of the Achilles tendon insertion. Tiny plantar calcaneal spur. Generalized osteopenia. Ligaments Ligaments are suboptimally evaluated by CT. ACL and PCL are grossly intact. Muscles and Tendons Generalized muscle atrophy throughout the thigh and lower leg. Intact quadriceps tendon and patellar tendon. Flexor, extensor, peroneal and Achilles tendons are grossly intact. No intramuscular fluid collection or hematoma. Soft tissue Soft tissue wound in the upper medial left thigh. 1.8 x 3.6 x 4.2 cm complex fluid collection underlying the wound within the subcutaneous fat with surrounding fat stranding most concerning for an abscess or hematoma. Soft tissue wound along the inferior medial left thigh with a wound-vac present. Surrounding skin thickening and fat stranding within the subcutaneous fat most concerning for cellulitis. No soft tissue mass. Extensive peripheral vascular atherosclerotic disease. IMPRESSION: 1. Soft tissue wound along the inferior medial left thigh with a  wound-vac present. Surrounding skin thickening and fat stranding within the subcutaneous fat most concerning for cellulitis. No adjacent fluid collection to suggest an abscess. 2. Soft tissue wound in the upper medial left thigh. 1.8 x 3.6 x 4.2 cm complex fluid collection underlying the wound within the subcutaneous fat with surrounding fat stranding most concerning for an abscess or hematoma. This area is just inferior to the inguinal region. 3. No acute osseous abnormality of the left femur, left tibia or left fibula. 4. Mild tricompartmental osteoarthritis of the left knee. 5. Mild  osteoarthritis of the tibiotalar joint and talonavicular joint. Electronically Signed   By: Elige Ko   On: 06/09/2016 17:26    Anti-infectives: Anti-infectives    Start     Dose/Rate Route Frequency Ordered Stop   06/10/16 1200  vancomycin (VANCOCIN) IVPB 1000 mg/200 mL premix     1,000 mg 200 mL/hr over 60 Minutes Intravenous Every M-W-F (Hemodialysis) 06/10/16 0851     06/10/16 1000  piperacillin-tazobactam (ZOSYN) IVPB 3.375 g     3.375 g 12.5 mL/hr over 240 Minutes Intravenous Every 12 hours 06/10/16 0851     06/10/16 1000  vancomycin (VANCOCIN) 2,250 mg in sodium chloride 0.9 % 500 mL IVPB  Status:  Discontinued     2,250 mg 250 mL/hr over 120 Minutes Intravenous Every 12 hours 06/10/16 0851 06/10/16 0858   06/10/16 1000  vancomycin (VANCOCIN) 2,250 mg in sodium chloride 0.9 % 500 mL IVPB     2,250 mg 250 mL/hr over 120 Minutes Intravenous  Once 06/10/16 0858      Assessment/Plan Left thigh abscess - S/P debridement left thigh wound 05/21/16 Dr. Dwain Sarna - Repeat CT with new underlying fluid collection.  - We will plan to take patient to the OR tomorrow for another irrigation and debridement. I will call patients husband to obtain consent.   CVA- on ASA suppository 300 mg daily ID- vanc/zosyn    LOS: 2 days    Adam Phenix , [facility] 06/10/2016, 10:11 AM Pager: [phone] Consults: [phone] Mon-Fri 7:00 am-4:30 pm Sat-Sun 7:00 am-11:30 am

## 2016-06-11 ENCOUNTER — Inpatient Hospital Stay (HOSPITAL_COMMUNITY): Payer: Medicaid Other | Admitting: Anesthesiology

## 2016-06-11 ENCOUNTER — Encounter (HOSPITAL_COMMUNITY): Payer: Self-pay | Admitting: Anesthesiology

## 2016-06-11 ENCOUNTER — Encounter (HOSPITAL_COMMUNITY): Admission: EM | Disposition: E | Payer: Self-pay | Source: Home / Self Care | Attending: Internal Medicine

## 2016-06-11 ENCOUNTER — Encounter (HOSPITAL_COMMUNITY): Payer: Medicaid Other

## 2016-06-11 DIAGNOSIS — E43 Unspecified severe protein-calorie malnutrition: Secondary | ICD-10-CM

## 2016-06-11 HISTORY — PX: INCISION AND DRAINAGE PERIRECTAL ABSCESS: SHX1804

## 2016-06-11 LAB — COMPREHENSIVE METABOLIC PANEL
ALK PHOS: 116 U/L (ref 38–126)
ALT: 22 U/L (ref 14–54)
ANION GAP: 16 — AB (ref 5–15)
AST: 18 U/L (ref 15–41)
Albumin: 1.7 g/dL — ABNORMAL LOW (ref 3.5–5.0)
BILIRUBIN TOTAL: 1.9 mg/dL — AB (ref 0.3–1.2)
BUN: 21 mg/dL — ABNORMAL HIGH (ref 6–20)
CALCIUM: 8.8 mg/dL — AB (ref 8.9–10.3)
CO2: 23 mmol/L (ref 22–32)
CREATININE: 2.7 mg/dL — AB (ref 0.44–1.00)
Chloride: 98 mmol/L — ABNORMAL LOW (ref 101–111)
GFR, EST AFRICAN AMERICAN: 24 mL/min — AB (ref >60)
GFR, EST NON AFRICAN AMERICAN: 21 mL/min — AB (ref >60)
Glucose, Bld: 140 mg/dL — ABNORMAL HIGH (ref 65–99)
Potassium: 4.5 mmol/L (ref 3.5–5.1)
Sodium: 137 mmol/L (ref 135–145)
TOTAL PROTEIN: 7.9 g/dL (ref 6.5–8.1)

## 2016-06-11 LAB — GLUCOSE, CAPILLARY
GLUCOSE-CAPILLARY: 183 mg/dL — AB (ref 65–99)
GLUCOSE-CAPILLARY: 178 mg/dL — AB (ref 65–99)
GLUCOSE-CAPILLARY: 185 mg/dL — AB (ref 65–99)
Glucose-Capillary: 182 mg/dL — ABNORMAL HIGH (ref 65–99)

## 2016-06-11 LAB — CBC
HCT: 26.7 % — ABNORMAL LOW (ref 36.0–46.0)
HEMOGLOBIN: 7.9 g/dL — AB (ref 12.0–15.0)
MCH: 27 pg (ref 26.0–34.0)
MCHC: 29.6 g/dL — AB (ref 30.0–36.0)
MCV: 91.1 fL (ref 78.0–100.0)
PLATELETS: 280 10*3/uL (ref 150–400)
RBC: 2.93 MIL/uL — AB (ref 3.87–5.11)
RDW: 18.5 % — ABNORMAL HIGH (ref 11.5–15.5)
WBC: 18 10*3/uL — ABNORMAL HIGH (ref 4.0–10.5)

## 2016-06-11 LAB — CORTISOL: CORTISOL PLASMA: 26.9 ug/dL

## 2016-06-11 LAB — LACTIC ACID, PLASMA: LACTIC ACID, VENOUS: 1.4 mmol/L (ref 0.5–1.9)

## 2016-06-11 SURGERY — INCISION AND DRAINAGE, ABSCESS, PERIRECTAL
Anesthesia: General | Site: Thigh | Laterality: Left

## 2016-06-11 MED ORDER — SODIUM CHLORIDE 0.9 % IV SOLN: 100.0000 mL | INTRAVENOUS | Status: DC | PRN

## 2016-06-11 MED ORDER — PROPOFOL 10 MG/ML IV BOLUS
INTRAVENOUS | Status: AC
  Filled 2016-06-11: qty 20

## 2016-06-11 MED ORDER — NEPRO/CARBSTEADY PO LIQD
1000.0000 mL | ORAL | Status: DC
  Administered 2016-06-11 – 2016-06-25 (×12): 1000 mL via ORAL
  Filled 2016-06-11 (×17): qty 1000

## 2016-06-11 MED ORDER — PROPOFOL 10 MG/ML IV BOLUS
INTRAVENOUS | Status: DC | PRN
  Administered 2016-06-11 (×2): 40 mg via INTRAVENOUS

## 2016-06-11 MED ORDER — PHENYLEPHRINE HCL 10 MG/ML IJ SOLN
INTRAMUSCULAR | Status: DC | PRN
  Administered 2016-06-11 (×3): 80 ug via INTRAVENOUS

## 2016-06-11 MED ORDER — LIDOCAINE HCL (PF) 1 % IJ SOLN: 5.0000 mL | INTRAMUSCULAR | Status: DC | PRN

## 2016-06-11 MED ORDER — PENTAFLUOROPROP-TETRAFLUOROETH EX AERO: 1.0000 "application " | INHALATION_SPRAY | CUTANEOUS | Status: DC | PRN

## 2016-06-11 MED ORDER — LIDOCAINE 2% (20 MG/ML) 5 ML SYRINGE
INTRAMUSCULAR | Status: AC
  Filled 2016-06-11: qty 5

## 2016-06-11 MED ORDER — SUCCINYLCHOLINE CHLORIDE 200 MG/10ML IV SOSY
PREFILLED_SYRINGE | INTRAVENOUS | Status: AC
  Filled 2016-06-11: qty 10

## 2016-06-11 MED ORDER — MIDAZOLAM HCL 2 MG/2ML IJ SOLN
INTRAMUSCULAR | Status: AC
  Filled 2016-06-11: qty 2

## 2016-06-11 MED ORDER — PRO-STAT SUGAR FREE PO LIQD
30.0000 mL | Freq: Three times a day (TID) | ORAL | Status: DC
  Administered 2016-06-11 – 2016-06-25 (×35): 30 mL
  Filled 2016-06-11 (×35): qty 30

## 2016-06-11 MED ORDER — ALTEPLASE 2 MG IJ SOLR
2.0000 mg | Freq: Once | INTRAMUSCULAR | Status: DC | PRN
Start: 2016-06-11 — End: 2016-06-12

## 2016-06-11 MED ORDER — HEPARIN SODIUM (PORCINE) 1000 UNIT/ML DIALYSIS
4400.0000 [IU] | Freq: Once | INTRAMUSCULAR | Status: DC
  Filled 2016-06-11: qty 5

## 2016-06-11 MED ORDER — 0.9 % SODIUM CHLORIDE (POUR BTL) OPTIME
TOPICAL | Status: DC | PRN
  Administered 2016-06-11: 1000 mL

## 2016-06-11 MED ORDER — HEPARIN SODIUM (PORCINE) 1000 UNIT/ML DIALYSIS
1000.0000 [IU] | INTRAMUSCULAR | Status: DC | PRN
  Filled 2016-06-11: qty 1

## 2016-06-11 MED ORDER — LACTATED RINGERS IV SOLN
INTRAVENOUS | Status: DC | PRN
  Administered 2016-06-11: 12:00:00 via INTRAVENOUS

## 2016-06-11 MED ORDER — LIDOCAINE HCL (CARDIAC) 20 MG/ML IV SOLN
INTRAVENOUS | Status: DC | PRN
  Administered 2016-06-11: 50 mg via INTRAVENOUS

## 2016-06-11 MED ORDER — FENTANYL CITRATE (PF) 100 MCG/2ML IJ SOLN
INTRAMUSCULAR | Status: DC | PRN
  Administered 2016-06-11: 100 ug via INTRAVENOUS

## 2016-06-11 MED ORDER — FENTANYL CITRATE (PF) 250 MCG/5ML IJ SOLN
INTRAMUSCULAR | Status: AC
  Filled 2016-06-11: qty 5

## 2016-06-11 MED ORDER — SODIUM CHLORIDE 0.9 % IR SOLN
Status: DC | PRN
  Administered 2016-06-11: 1000 mL

## 2016-06-11 MED ORDER — SUCCINYLCHOLINE CHLORIDE 20 MG/ML IJ SOLN
INTRAMUSCULAR | Status: DC | PRN
  Administered 2016-06-11: 80 mg via INTRAVENOUS

## 2016-06-11 MED ORDER — LIDOCAINE-PRILOCAINE 2.5-2.5 % EX CREA: 1.0000 "application " | TOPICAL_CREAM | CUTANEOUS | Status: DC | PRN

## 2016-06-11 MED ORDER — ONDANSETRON HCL 4 MG/2ML IJ SOLN
INTRAMUSCULAR | Status: AC
  Filled 2016-06-11: qty 2

## 2016-06-11 MED ORDER — METOPROLOL TARTRATE 5 MG/5ML IV SOLN
5.0000 mg | Freq: Three times a day (TID) | INTRAVENOUS | Status: DC
  Administered 2016-06-11 – 2016-06-14 (×8): 5 mg via INTRAVENOUS
  Filled 2016-06-11 (×8): qty 5

## 2016-06-11 SURGICAL SUPPLY — 32 items
BNDG GAUZE ELAST 4 BULKY (GAUZE/BANDAGES/DRESSINGS) IMPLANT
COVER MAYO STAND STRL (DRAPES) ×3 IMPLANT
COVER SURGICAL LIGHT HANDLE (MISCELLANEOUS) ×3 IMPLANT
DRSG VAC ATS LRG SENSATRAC (GAUZE/BANDAGES/DRESSINGS) ×3 IMPLANT
ELECT CAUTERY BLADE 6.4 (BLADE) ×3 IMPLANT
ELECT REM PT RETURN 9FT ADLT (ELECTROSURGICAL) ×3
ELECTRODE REM PT RTRN 9FT ADLT (ELECTROSURGICAL) ×1 IMPLANT
GAUZE SPONGE 4X4 12PLY STRL (GAUZE/BANDAGES/DRESSINGS) IMPLANT
GLOVE BIO SURGEON STRL SZ7 (GLOVE) ×3 IMPLANT
GLOVE BIOGEL PI IND STRL 7.5 (GLOVE) ×1 IMPLANT
GLOVE BIOGEL PI INDICATOR 7.5 (GLOVE) ×2
GOWN STRL REUS W/ TWL LRG LVL3 (GOWN DISPOSABLE) ×2 IMPLANT
GOWN STRL REUS W/TWL LRG LVL3 (GOWN DISPOSABLE) ×4
HANDPIECE INTERPULSE COAX TIP (DISPOSABLE) ×2
HOVERMATT SINGLE USE (MISCELLANEOUS) ×3 IMPLANT
KIT BASIN OR (CUSTOM PROCEDURE TRAY) ×3 IMPLANT
KIT ROOM TURNOVER OR (KITS) ×3 IMPLANT
NS IRRIG 1000ML POUR BTL (IV SOLUTION) ×3 IMPLANT
PACK LITHOTOMY IV (CUSTOM PROCEDURE TRAY) ×3 IMPLANT
PAD ARMBOARD 7.5X6 YLW CONV (MISCELLANEOUS) ×3 IMPLANT
PENCIL BUTTON HOLSTER BLD 10FT (ELECTRODE) ×3 IMPLANT
SET HNDPC FAN SPRY TIP SCT (DISPOSABLE) ×1 IMPLANT
SPONGE LAP 18X18 X RAY DECT (DISPOSABLE) ×3 IMPLANT
SURGILUBE 2OZ TUBE FLIPTOP (MISCELLANEOUS) ×3 IMPLANT
SWAB CULTURE LIQ STUART DBL (MISCELLANEOUS) ×3 IMPLANT
SWAB CULTURE LIQUID MINI MALE (MISCELLANEOUS) ×3 IMPLANT
SYR BULB 3OZ (MISCELLANEOUS) ×3 IMPLANT
TOWEL OR 17X24 6PK STRL BLUE (TOWEL DISPOSABLE) ×3 IMPLANT
TOWEL OR 17X26 10 PK STRL BLUE (TOWEL DISPOSABLE) ×3 IMPLANT
TUBE CONNECTING 12'X1/4 (SUCTIONS) ×1
TUBE CONNECTING 12X1/4 (SUCTIONS) ×2 IMPLANT
YANKAUER SUCT BULB TIP NO VENT (SUCTIONS) ×3 IMPLANT

## 2016-06-11 NOTE — Progress Notes (Signed)
INFECTIOUS DISEASE PROGRESS NOTE  ID: [name] is a 41 y.o. female with  Principal Problem:   CVA (cerebral vascular accident) (HCC) Active Problems:   Morbid obesity (HCC)   Essential hypertension   Acute metabolic encephalopathy   Uncontrolled diabetes mellitus with diabetic nephropathy, with long-term current use of insulin (HCC)   ESRD (end stage renal disease) (HCC)   Depression due to physical illness   Wound abscess  Subjective: No response  Abtx:  Anti-infectives    Start     Dose/Rate Route Frequency Ordered Stop   06/10/16 1200  vancomycin (VANCOCIN) IVPB 1000 mg/200 mL premix     1,000 mg 200 mL/hr over 60 Minutes Intravenous Every M-W-F (Hemodialysis) 06/10/16 0851     06/10/16 1000  piperacillin-tazobactam (ZOSYN) IVPB 3.375 g     3.375 g 12.5 mL/hr over 240 Minutes Intravenous Every 12 hours 06/10/16 0851     06/10/16 1000  vancomycin (VANCOCIN) 2,250 mg in sodium chloride 0.9 % 500 mL IVPB  Status:  Discontinued     2,250 mg 250 mL/hr over 120 Minutes Intravenous Every 12 hours 06/10/16 0851 06/10/16 0858   06/10/16 1000  vancomycin (VANCOCIN) 2,250 mg in sodium chloride 0.9 % 500 mL IVPB     2,250 mg 250 mL/hr over 120 Minutes Intravenous  Once 06/10/16 0858 06/10/16 1640      Medications:  Scheduled: . aspirin  300 mg Rectal Daily  . chlorhexidine  15 mL Mouth Rinse BID  . [START ON 06/12/2016] heparin  4,400 Units Dialysis Once in dialysis  . insulin aspart  0-9 Units Subcutaneous Q4H  . mouth rinse  15 mL Mouth Rinse q12n4p  . metoprolol  5 mg Intravenous Q8H  . pneumococcal 23 valent vaccine  0.5 mL Intramuscular Tomorrow-1000  . sodium hypochlorite   Irrigation BID    Objective: Vital signs in last 24 hours: Temp:  [97.9 F (36.6 C)-99.9 F (37.7 C)] 98 F (36.7 C) (05/01 1416) Pulse Rate:  [97-118] 101 (05/01 1416) Resp:  [10-30] 18 (05/01 1416) BP: (91-130)/(53-92) 94/92 (05/01 1419) SpO2:  [96 %-100 %] 96 % (05/01  1419)   General appearance: no distress and no response Resp: clear to auscultation bilaterally Chest wall: no tenderness, R sided HD line. Clean, no d/c.  Cardio: regular rate and rhythm GI: normal findings: bowel sounds normal and soft, non-tender  Lab Results  Recent Labs  06/10/16 1300 06/10/16 1349 07/08/2016 0230  WBC  --  17.7* 18.0*  HGB  --  7.9* 7.9*  HCT  --  26.2* 26.7*  NA 139  --  137  K 3.2*  --  4.5  CL 100*  --  98*  CO2 26  --  23  BUN 30*  --  21*  CREATININE 3.15*  --  2.70*   Liver Panel  Recent Labs  06/09/16 0258 06/10/16 1300 07/07/2016 0230  PROT 8.0  --  7.9  ALBUMIN 1.8* 1.7* 1.7*  AST 17  --  18  ALT 25  --  22  ALKPHOS 101  --  116  BILITOT 0.8  --  1.9*   Sedimentation Rate No results for input(s): ESRSEDRATE in the last 72 hours. C-Reactive Protein No results for input(s): CRP in the last 72 hours.  Microbiology: Recent Results (from the past 240 hour(s))  Culture, blood (Routine X 2) w Reflex to ID Panel     Status: None (Preliminary result)   Collection Time: 06/10/16 12:30 PM  Result Value Ref Range Status   Specimen Description BLOOD RIGHT HEMODIALYSIS CATHETER  Final   Special Requests   Final    BOTTLES DRAWN AEROBIC AND ANAEROBIC Blood Culture results may not be optimal due to an excessive volume of blood received in culture bottles   Culture PENDING  Incomplete   Report Status PENDING  Incomplete  Culture, blood (Routine X 2) w Reflex to ID Panel     Status: None (Preliminary result)   Collection Time: 06/10/16 12:45 PM  Result Value Ref Range Status   Specimen Description BLOOD RIGHT URINE, CATHETERIZED  Final   Special Requests   Final    BOTTLES DRAWN AEROBIC AND ANAEROBIC Blood Culture adequate volume   Culture NO GROWTH < 24 HOURS  Final   Report Status PENDING  Incomplete    Studies/Results: Dg Abd 1 View  Result Date: 06/10/2016 CLINICAL DATA:  Feeding tube placement. EXAM: ABDOMEN - 1 VIEW COMPARISON:  CT  05/19/2016. FINDINGS: 1039 hours. Feeding tube is looped in the stomach with the tip projecting over the gastric pylorus or proximal duodenum. The visualized bowel gas pattern is nonobstructive. IMPRESSION: Feeding tube tip projects over the gastric pylorus or proximal duodenum. Electronically Signed   By: Carey Bullocks M.D.   On: 06/10/2016 10:51   Ct Femur Left Wo Contrast  Result Date: 06/09/2016 CLINICAL DATA:  Left posterior thigh wound s/p debridement. Now has climbing WBC and low grade fever as well as concern for endocarditis. EXAM: CT OF THE LEFT FEMUR WITHOUT CONTRAST CT OF THE LEFT TIBIA AND FIBULA WITHOUT CONTRAST TECHNIQUE: Multidetector CT imaging of the left tibia and fibula was performed according to the standard protocol. Multidetector CT imaging of the left femur was performed according to the standard protocol. COMPARISON:  None. FINDINGS: Bones/Joint/Cartilage No acute fracture or dislocation of the left femur. No acute fracture or dislocation of the left tibia or fibula. No periosteal reaction or bone destruction. No aggressive lytic or sclerotic osseous lesion. Normal alignment. No joint effusion. Tricompartmental mild osteoarthritis of the left knee with small marginal osteophytes. Mild osteoarthritis of the tibiotalar joint. Mild osteoarthritis of the talonavicular joint. Mild enthesopathic changes of the Achilles tendon insertion. Tiny plantar calcaneal spur. Generalized osteopenia. Ligaments Ligaments are suboptimally evaluated by CT. ACL and PCL are grossly intact. Muscles and Tendons Generalized muscle atrophy throughout the thigh and lower leg. Intact quadriceps tendon and patellar tendon. Flexor, extensor, peroneal and Achilles tendons are grossly intact. No intramuscular fluid collection or hematoma. Soft tissue Soft tissue wound in the upper medial left thigh. 1.8 x 3.6 x 4.2 cm complex fluid collection underlying the wound within the subcutaneous fat with surrounding fat  stranding most concerning for an abscess or hematoma. Soft tissue wound along the inferior medial left thigh with a wound-vac present. Surrounding skin thickening and fat stranding within the subcutaneous fat most concerning for cellulitis. No soft tissue mass. Extensive peripheral vascular atherosclerotic disease. IMPRESSION: 1. Soft tissue wound along the inferior medial left thigh with a wound-vac present. Surrounding skin thickening and fat stranding within the subcutaneous fat most concerning for cellulitis. No adjacent fluid collection to suggest an abscess. 2. Soft tissue wound in the upper medial left thigh. 1.8 x 3.6 x 4.2 cm complex fluid collection underlying the wound within the subcutaneous fat with surrounding fat stranding most concerning for an abscess or hematoma. This area is just inferior to the inguinal region. 3. No acute osseous abnormality of the left femur, left tibia or left  fibula. 4. Mild tricompartmental osteoarthritis of the left knee. 5. Mild osteoarthritis of the tibiotalar joint and talonavicular joint. Electronically Signed   By: Elige Ko   On: 06/09/2016 17:26   Ct Tibia Fibula Left Wo Contrast  Result Date: 06/09/2016 CLINICAL DATA:  Left posterior thigh wound s/p debridement. Now has climbing WBC and low grade fever as well as concern for endocarditis. EXAM: CT OF THE LEFT FEMUR WITHOUT CONTRAST CT OF THE LEFT TIBIA AND FIBULA WITHOUT CONTRAST TECHNIQUE: Multidetector CT imaging of the left tibia and fibula was performed according to the standard protocol. Multidetector CT imaging of the left femur was performed according to the standard protocol. COMPARISON:  None. FINDINGS: Bones/Joint/Cartilage No acute fracture or dislocation of the left femur. No acute fracture or dislocation of the left tibia or fibula. No periosteal reaction or bone destruction. No aggressive lytic or sclerotic osseous lesion. Normal alignment. No joint effusion. Tricompartmental mild osteoarthritis  of the left knee with small marginal osteophytes. Mild osteoarthritis of the tibiotalar joint. Mild osteoarthritis of the talonavicular joint. Mild enthesopathic changes of the Achilles tendon insertion. Tiny plantar calcaneal spur. Generalized osteopenia. Ligaments Ligaments are suboptimally evaluated by CT. ACL and PCL are grossly intact. Muscles and Tendons Generalized muscle atrophy throughout the thigh and lower leg. Intact quadriceps tendon and patellar tendon. Flexor, extensor, peroneal and Achilles tendons are grossly intact. No intramuscular fluid collection or hematoma. Soft tissue Soft tissue wound in the upper medial left thigh. 1.8 x 3.6 x 4.2 cm complex fluid collection underlying the wound within the subcutaneous fat with surrounding fat stranding most concerning for an abscess or hematoma. Soft tissue wound along the inferior medial left thigh with a wound-vac present. Surrounding skin thickening and fat stranding within the subcutaneous fat most concerning for cellulitis. No soft tissue mass. Extensive peripheral vascular atherosclerotic disease. IMPRESSION: 1. Soft tissue wound along the inferior medial left thigh with a wound-vac present. Surrounding skin thickening and fat stranding within the subcutaneous fat most concerning for cellulitis. No adjacent fluid collection to suggest an abscess. 2. Soft tissue wound in the upper medial left thigh. 1.8 x 3.6 x 4.2 cm complex fluid collection underlying the wound within the subcutaneous fat with surrounding fat stranding most concerning for an abscess or hematoma. This area is just inferior to the inguinal region. 3. No acute osseous abnormality of the left femur, left tibia or left fibula. 4. Mild tricompartmental osteoarthritis of the left knee. 5. Mild osteoarthritis of the tibiotalar joint and talonavicular joint. 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 Would continue for now.  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 Repeat Cx pending.   Reactive RPR 1:1 - HIV (-) -T Pallidum Abs negative - likely a false positive RPR.  - Followed up with state health dept, will treat as false positive.   - ESRD  - Severe, protein calorie malnutrition  Total days of antibiotics: 3 vanco/zosyn         Johny Sax Infectious Diseases (pager) [phone] [email or web address] 06/28/2016, 3:02 PM  LOS: 3 days

## 2016-06-11 NOTE — Anesthesia Procedure Notes (Signed)
Procedure Name: Intubation Date/Time: 07/10/2016 12:49 PM Performed by: Greggory Stallion, Samarah Hogle L Pre-anesthesia Checklist: Patient identified, Emergency Drugs available, Suction available and Patient being monitored Patient Re-evaluated:Patient Re-evaluated prior to inductionOxygen Delivery Method: Circle System Utilized Preoxygenation: Pre-oxygenation with 100% oxygen Intubation Type: IV induction, Rapid sequence and Cricoid Pressure applied Ventilation: Mask ventilation without difficulty Laryngoscope Size: Mac and 3 Grade View: Grade I Tube type: Oral Tube size: 7.5 mm Number of attempts: 1 Airway Equipment and Method: Stylet and Oral airway Placement Confirmation: ETT inserted through vocal cords under direct vision,  positive ETCO2 and breath sounds checked- equal and bilateral Secured at: 21 cm Tube secured with: Tape Dental Injury: Teeth and Oropharynx as per pre-operative assessment

## 2016-06-11 NOTE — Op Note (Signed)
Preop diagnosis:  Left thigh soft tissue infection Postop diagnosis: Same Procedure performed: Excision of skin and subcutaneous tissue of left thigh (25 x 8 x 3 cm) Surgeon:Pieper Kasik K. Anesthesia: Gen. Indications: This is a 41 year old female with multiple medical issues who is chronically debilitated and bedbound. She had a soft tissue infection of her left thigh that was debrided on 05/21/16. Recently she underwent a repeat CT scan of the thigh because of an increasing white blood cell count. She is noted to have a another fluid collection trapped in the symphysis tissues just superior to the previous wound. The previous wound also shows some areas of necrosis.  Description of procedure: The patient is brought to the operating room placed in supine position on the operating room table. After an adequate level general anesthesia was obtained, her legs were placed in lithotomy position in yellowfin stirrups. Her left thigh was exposed. We removed the dressing. The previous wound shows some granulation tissue but the distal half shows some desiccated necrotic fat. There is induration more proximal on the thigh. There is some minimal purulent drainage within the wound.  We reprepped the entire thigh with Betadine and draped sterile fashion. A timeout was taken to ensure the proper patient and proper procedure. The previous wound measures about 14 cm long by 5 cm wide by 2 cm deep. We began by debriding the indurated area proximal to the previous wound. We excised skin and subcutaneous tissue. We encountered a pocket of fluid but this did not appear to be purulent. We went ahead and cultured. We dissected around the thick indurated tissue until encountered normal-appearing fat. Cautery she is for hemostasis. We then debrided some of the necrotic fat within the original wound. We excised some of the skin edges that seemed be indurated. We stopped breathing when all of the bed of the wound appeared to be  healthy appearing adipose tissue. I palpated the entire thigh did not feel any other indurated areas that might represent undrained abscess. We used pulse lavage to irrigate the wound thoroughly. A large VAC sponge was cut to fit. The entire wound measures 25 x 8 x 3 cm.  Via sponge was then placed in the wound and sealed with a occlusive drape. This was placed to suction. There was a good seal with no sign of leak. The patient was then next extubated and brought to recovery room in stable condition. All sponge, instrument, and needle counts are correct.  Wilmon Arms. Corliss Skains, MD, [facility] Surgery  General/ Trauma Surgery  07/02/2016 1:37 PM

## 2016-06-11 NOTE — Progress Notes (Addendum)
[facility] Progress Note   Subjective: had HD yest w/o incident.  Today still drowsy and not responding verbally  Vitals:   06/10/16 2241 06/15/2016 0339 06/19/2016 0830 06/30/2016 0900  BP: 114/76 122/79 130/67   Pulse: (!) 110 (!) 118 (!) 114 (!) 113  Resp: (!) 23 10 (!) 26 (!) 30  Temp: 98.4 F (36.9 C) 99.3 F (37.4 C) 99.9 F (37.7 C)   TempSrc: Axillary Oral Axillary   SpO2: 99% 96% 99% 98%  Weight:      Height:        Inpatient medications: . aspirin  300 mg Rectal Daily  . chlorhexidine  15 mL Mouth Rinse BID  . insulin aspart  0-9 Units Subcutaneous Q4H  . mouth rinse  15 mL Mouth Rinse q12n4p  . metoprolol  5 mg Intravenous Q8H  . pneumococcal 23 valent vaccine  0.5 mL Intramuscular Tomorrow-1000  . sodium hypochlorite   Irrigation BID   . piperacillin-tazobactam (ZOSYN)  IV 3.375 g (06/24/2016 0935)  . vancomycin     [DISCONTINUED] acetaminophen **OR** [DISCONTINUED] acetaminophen (TYLENOL) oral liquid 160 mg/5 mL **OR** acetaminophen  Exam: Not responding, obese, temporal musc wasting No jvd Chest clear bilat RRR tachy no RG ABd obese soft nondistended Ext - wound VAC L thigh, some dark blistering superior to wound, no edema; RLE no edema LUA AVG+bruit R IJ cath Neuro- is not responding to voice/ stimulation  Dialysis:  [facility]  4:30 hours, 400/800  105.5kg   3K/2Ca   TDC R IJ/ maturing LUA AVG  Hep 4400 - Mircera IV q 2 weeks (last given 4/25) - Calcitriol 0.48mcg PO q HD  Assessment: 1. Acute multifocal CVAs: Plan per neuro and intensivist. 2. AMS: poorly responsive and new fevers , prob septic, on IV vanc/ zosyn 3. Abnormal ECHO: Possible vegetation on L coronary cusp. TEE planned. 1/6 +bcx's for CNSS was felt to be contaminant most likely per ID.  4. ESRD: MWF HD. Has L arm AVG placed 4/3, ready to use at 4 wks  5. HTN: home coreg on hold, getting q 8hr IV metoprolol for now 6. Volume: under dry wt 2kg, no excess on  exam 7. Anemia: Hgb 8.1, just got mircera 150 ug on 4/25. Transfuse prn. Last tsat 4/19 not calc due to low transferrin. Ferritin 450 > 1000> 3100 over last 3 mos, poss d/t leg infection.   8. Metabolic bone disease: CorrCa high, Phos ok. Holding VDRA/ binders for now 9. Nutrition: Alb very low, Cortrak is in now for TF's 10. L thigh soft-tissue infection: sp I&D 4/10 by gen surg. Wound vac in place, back to OR today   Plan - HD Wed, no UF, will use AVG   Vinson Moselle MD [facility] pager [phone]   07/09/2016, 11:22 AM    Recent Labs Lab 06/09/16 0258 06/10/16 1300 07/01/2016 0230  NA 137 139 137  K 3.8 3.2* 4.5  CL 97* 100* 98*  CO2 GLUCOSE 157* 123* 140*  BUN 37* 30* 21*  CREATININE 3.99* 3.15* 2.70*  CALCIUM 9.5 8.9 8.8*  PHOS 5.0* 2.9  --     Recent Labs Lab 06/04/2016 1745 06/09/16 0258 06/10/16 1300 06/14/2016 0230  AST 28 17  --  18  ALT 30 25  --  22  ALKPHOS 113 101  --  116  BILITOT 1.0 0.8  --  1.9*  PROT 8.5* 8.0  --  7.9  ALBUMIN 2.1* 1.8*  1.7* 1.7*    Recent Labs Lab 2016-06-19 1745  06/08/16 0448 06/09/16 0258 06/10/16 1349 07/07/2016 0230  WBC 20.0*  --  18.1* 18.4* 17.7* 18.0*  NEUTROABS 16.4*  --  14.4*  --   --   --   HGB 9.1*  < > 8.9* 8.1* 7.9* 7.9*  HCT 30.3*  < > 29.6* 26.4* 26.2* 26.7*  MCV 91.0  --  90.2 89.2 89.7 91.1  PLT 406*  --  419* 357 323 280  < > = values in this interval not displayed. Iron/TIBC/Ferritin/ %Sat    Component Value Date/Time   IRON 32 05/30/2016 1002   TIBC NOT CALCULATED 05/30/2016 1002   FERRITIN 3,194 (H) 05/30/2016 1002   IRONPCTSAT NOT CALCULATED 05/30/2016 1002

## 2016-06-11 NOTE — Progress Notes (Signed)
STROKE TEAM PROGRESS NOTE   HISTORY OF PRESENT ILLNESS (per record) [name] is an 41 y.o. female who presented for further assessment of mental status changes occurring after dialysis on Friday. Per EMS, she became unresponsive at the end of her treatment. She arrived to the ED with a GCS of 11 and a CBG of 188. The personnel at the dialysis center were unsure of when she was last known normal.   In the ED her WBC count was noted to be 20. She had no fever but was mildly tachycardic and tachypneic. Ammonia level was normal. ABG and clinical parameters showed no indication for ventilatory support. CT head showed no acute changes, with atrophy and chronic infarctions noted.   MRI/MRA of the head was obtained in the ED, revealing a cluster of 3 small acute ischemic infarctions involving the deep grey nuclei and posterior hippocampus on the right. MRA showed slow flow versus occlusion of the right MCA which was felt to be chronic given prior CT findings. 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 The left M1 segment was noted to be chronically stenotic.   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 Also had a recent admission from 3/21 - 4/20 for encephalopathy.   LSN: Unknown tPA Given: No: Not a tPA candidate given no known time of neurological symptom onset   SUBJECTIVE (INTERVAL HISTORY) Her mother and  ? step father are at bedside The patient is more sleepy and less interactive today   not following commands consistently or speaking.She is afebrile and plan to have surgical drainage of soft tissue abscess later today Temp:  [97.9 F (36.6 C)-99.9 F (37.7 C)] 98 F (36.7 C) (05/01 1416) Pulse Rate:  [97-118] 101 (05/01 1416) Cardiac Rhythm: Sinus  tachycardia (05/01 1416) Resp:  [10-30] 18 (05/01 1416) BP: (91-130)/(53-92) 94/92 (05/01 1419) SpO2:  [96 %-100 %] 96 % (05/01 1419)  CBC:   Recent Labs Lab 06/01/2016 1745  06/08/16 0448  06/10/16 1349 Jun 28, 2016 0230  WBC 20.0*  --  18.1*  < > 17.7* 18.0*  NEUTROABS 16.4*  --  14.4*  --   --   --   HGB 9.1*  < > 8.9*  < > 7.9* 7.9*  HCT 30.3*  < > 29.6*  < > 26.2* 26.7*  MCV 91.0  --  90.2  < > 89.7 91.1  PLT 406*  --  419*  < > 323 280  < > = values in this interval not displayed.  Basic Metabolic Panel:   Recent Labs Lab 06/09/16 0258 06/10/16 1300 Jun 28, 2016 0230  NA 137 139 137  K 3.8 3.2* 4.5  CL 97* 100* 98*  CO2 GLUCOSE 157* 123* 140*  BUN 37* 30* 21*  CREATININE 3.99* 3.15* 2.70*  CALCIUM 9.5 8.9 8.8*  MG 2.2  --   --   PHOS 5.0* 2.9  --     Lipid Panel:     Component Value Date/Time   CHOL 160 06/08/2016 0448   TRIG 146 06/08/2016 0448   HDL 45 06/08/2016 0448   CHOLHDL 3.6 06/08/2016 0448   VLDL 29 06/08/2016 0448   LDLCALC 86 06/08/2016 0448   HgbA1c:  Lab Results  Component Value Date   HGBA1C 6.7 (H) 06/09/2016  Urine Drug Screen:     Component Value Date/Time   LABOPIA NEGATIVE 09/05/2013 1822   LABOPIA NEG 08/26/2012 1251   COCAINSCRNUR NEGATIVE 09/05/2013 1822   LABBENZ NEGATIVE 09/05/2013 1822   AMPHETMU NEGATIVE 09/05/2013 1822    Alcohol Level     Component Value Date/Time   ETH <5 06/02/2016 1735    IMAGING  Ct Head Wo Contrast 06/09/2016 1. No acute intracranial abnormality identified.  2. Stable chronic basal ganglia and cerebellar hemisphere lacunar infarcts.     Mr Maxine Glenn Head Wo Contrast 05/21/2016  MRI HEAD:  Motion degraded examination. 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 Multiple old infarcts. Moderate parenchymal brain volume loss for age.   MRA HEAD:  Moderately motion degraded examination. Slow flow versus occluded MCA, likely chronic given prior CT findings.  Chronically occluded RIGHT internal carotid artery, and LEFT vertebral artery. Chronically stenotic proximal LEFT M1 segment, difficult to quantify. 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     Dg Chest Portable 1 View 06/05/2016 1. No radiopaque metallic foreign object.  2. No acute cardiopulmonary process.  Mild cardiomegaly.  3. Right IJ dialysis catheter with tip at the cavoatrial junction.    EEG 06/08/2016 EEG Abnormalities: 1) generalized irregular slow activity 2) slow PDR Clinical Interpretation: This EEG is consistent with a moderate generalized non-specific cerebral dysfunction(encephalopathy).  There was no seizure or seizure predisposition recorded on this study. Please note that a normal EEG does not preclude the possibility of epilepsy.    PHYSICAL EXAM Middle aged  morbidly obese lady not in distress. . Afebrile. Head is nontraumatic. Neck is supple without bruit.    Cardiac exam no murmur or gallop. Lungs are clear to auscultation. Lower extremity edema with chronic wounds and wound vac on left medial thigh.  Neurological Exam :  Obtunded barely opens eyes but. does not follow any commands.Tries to speak a few words but difficult to understand No volitional movement except for head turning away from noxious.  Cranial Nerves: II:  Blinks to threat bilaterally immediately after sternal rub. Pupils sluggishly reactive.  III,IV, VI: Eyes with mild exotropia and slow roving EOM. Slow roving EOM occur towards the side of noxious stimuli at times.  V,VII: Mildly decreased grimace on left side with noxious, on a somewhat flaccid background.  VIII: no response to questions or commands IX,X: unable to visualize palate XI: turns head to left and right in response to noxious XII: does not follow command for tongue extension Motor/Sensory:  Mildly increased flexor tone RUE. Flickers fingers to noxious on right. Flaccid LUE tone. No movement to noxious. Bilateral  lower extremities weakly withdraw to plantar stimulation with 2/5 strength, slightly more briskly on the right.  Deep Tendon Reflexes:  Right brachioradialis and biceps 2+. Left brachioradialis and biceps 1+. Absent patellar and achilles reflexes bilaterally.  Plantars: Equivocal responses bilaterally.  Cerebellar/Gait: Unable to assess.  ASSESSMENT/PLAN Ms. Lanay Zinda Raul Del is a 41 y.o. female with history of end-stage renal disease on dialysis, diabetes mellitus, previous strokes, morbid obesity, hypertension, congestive heart failure, multiple pressure ulcers and asthma presenting with unresponsiveness. She did not receive IV t-PA due to unknown time of onset.  Stroke:  Acute subcentimeter RIGHT deep gray nuclei and RIGHT hippocampal infarcts likely small vessel disease with underlying encepaholpathy from renal failure  Resultant  Altered mental status and left hemiplegia  MRI  Acute subcentimeter RIGHT deep gray nuclei and RIGHT hippocampal infarcts  MRA Slow flow versus occluded MCA.  Chronically occluded RIGHT internal carotid artery, and LEFT vertebral artery.  EEG - consistent with a moderate generalized non-specific cerebral dysfunction(encephalopathy). See above  Carotid Doppler - will order 2D Echo - There is an echodensity present on the left coronary cusp   measuring 7 x 6 mm. This most probably represents vegetation   since it wasn&'t present on echocardiogram on 03/22/2016. Further    evaluation with TEE is recommended  LDL - 86  HgbA1c 6.7  VTE prophylaxis - SCDs    No antithrombotic prior to admission, now on aspirin 300 mg suppository daily  Patient will be counseled to be compliant with her antithrombotic medications  Ongoing aggressive stroke risk factor management  Therapy recommendations: pending  Disposition: Pending  Hypertension  Blood pressure tends to run low  Permissive hypertension (OK if < 220/120) but gradually normalize in 5-7  days  Long-term BP goal normotensive  Hyperlipidemia  Home meds: Lipitor 20 mg daily not resumed  LDL 86, goal < 70  Increase Lipitor to 40 mg daily when PO access is available  Continue statin at discharge  Diabetes  HgbA1c pending, goal < 7.0  Unc / Controlled  Other Stroke Risk Factors  Obesity, Body mass index is 41.41 kg/m., recommend weight loss, diet and exercise as appropriate   Hx stroke/TIA   Other Active Problems  Leukocytosis  Anemia  End-stage renal disease  Multiple pressure and skin ulcers  History of encephalopathy  Hospital day # 3   I have personally examined this patient, reviewed notes, independently viewed imaging studies, participated in medical decision making and plan of care.ROS completed by me personally and pertinent positives fully documented  I have made any additions or clarifications directly to the above note. .  She has presented with altered mental status and left hemiparesis following dialysis and MRI shows small right deep brain infarcts but exam appears disproportionate to size of her infarcts and suspect underlying encephalopathy from sepsis as well. Recent fever and abnormal echo raise concern for endocarditis. Recommend TEE and blood cultures and antibiotics.Plan is for surgical drainage of abscess today. Stroke team will sign off. D/W Dr Sharon Seller  , mother  and answered questions.   Delia Heady, MD Medical Director [facility] Pager: [phone] 06/30/16 3:05 PM  To contact Stroke Continuity provider, please refer to [email or web address]. After hours, contact General Neurology

## 2016-06-11 NOTE — Progress Notes (Signed)
[facility] TEAM 1 - Stepdown/ICU TEAM  Aariana Shankland  WGN:[id number] DOB: [birth year] DOA: 05/25/2016 PCP: Marletta Lor, NP    Brief Narrative:  41 y.o.woman with a history of CVA, DM2, morbid obesity, chronic lymphedema, HTN, chronic diastolic heart failure, depression, and admission 3/21 > 4/20 for acute encephalopathy, acute renal failure on CKD 5 requiring HD, decompensated heart failure, and infected leg ulcer.  She was discharged to a SNF.  She had been dialyzing, presumably on MWF.  Upon completion of her HD session on the day of presentation, she was noted to have mental status changes.  It was not clear to the personnel when she was last known normal.  She was transferred to the ED where she had a WBC count of 20, Hgb 9, Ammonia level normal.  Head CT was negative for acute process.  MRI/MRA noted acute right deep gray nuclei and right hippocampal infarcts with an occluded R MCA, Right ICA, and Left vertebral artery.  Subjective: Pt remains essentially obtunded.  She will intermittently open her eyes, but she does not follow the examiner nor does she communicate in any meaningful fashion.  She is not however in any acute resp distress, and her vitals are actually quite stable.    Assessment & Plan:  Acute metabolic encephalopathy in setting of chronic encephalopathy During last admit mental status apparently waxed and waned - Psych saw in consultation and suggested tx for depression - suspect her acute change is due to TME in setting of sepsis related to her L thigh wound and possible SBE - cont supportive care   L posterior/medial thigh wound s/p debridement by Len Childs 05/21/16 - completed a course of abx tx on 05/31/16 - CT of the thigh has noted a new area c/w an abscess not communicating with the currently opened area - Gen Surg has been consulted and plans to take her to the OR 06/30/2016 - broad abx coverage to include MRSA coverage to continue   SBE - Possible 7x25mm vegetation L  coronary cusp  Blood cx during last admit 1 of 6 + for coag neg Staph strongly suggestive of contaminant - no such mass noted on TTE 03/22/16 - will need TEE to investigate when more stable (have not yet called Cards as pt not stable enough for TEE presently)   Acute, multifocal CVAs with history of multiple prior CVAs bedbound at baseline due to prior CVAs - Neurology following - current acute CVAs not felt to be signif enough to explain her encephalopathy   ESRD on HD Nephrology following - for HD M/W/F   Reactive RPR Quant f/u RPR 1:1 w/ negative T pallidum abs - ?false negative - ID following   Morbid obesity - Body mass index is 41.41 kg/m.   Nutrition  Cortrak has been placed - plan to initiate tube feeds once returns from OR today  HTN BP well controlled/stable   DM CBG reasonably well controlled - cont to follow Q4hr  Chronic diastolic congestive heart failure TTE 4/28 noted EF 55-60% w/ grade 1 DD - EDW per HD center is 105.5kg  Filed Weights   06/08/16 0421 06/09/16 0500  Weight: 105.3 kg (232 lb 2.3 oz) 102.7 kg (226 lb 6.6 oz)    DVT prophylaxis: SCDs Code Status: FULL CODE Family Communication: no family present at time of exam  Disposition Plan: SDU  Consultants:  Neurology  Gen Surgery Nephrology   Procedures: none  Antimicrobials:  Zosyn 4/30 > Vanc 4/30 >  Objective: Blood pressure 130/67, pulse (!) 114, temperature 99.9 F (37.7 C), temperature source Axillary, resp. rate (!) 26, height  (1.575 m), weight 102.7 kg (226 lb 6.6 oz), SpO2 99 %.  Intake/Output Summary (Last 24 hours) at 06/16/2016 0914 Last data filed at 06/10/16 1900  Gross per 24 hour  Intake               50 ml  Output             -288 ml  Net              338 ml   Filed Weights   06/08/16 0421 06/09/16 0500  Weight: 105.3 kg (232 lb 2.3 oz) 102.7 kg (226 lb 6.6 oz)    Examination: General: No acute respiratory distress - obtunded  Lungs: Clear to auscultation  bilaterally without wheeze - distant BS related to body habitus  Cardiovascular: Regular rate and rhythm without murmur - distant HS  Abdomen: Obese, soft, bowel sounds present, no organomegaly, no rebound - Cortrak in L nare Extremities: L medial thigh wound dressed and presently dry - change of chronic venous statis dermatitis noted B Neurologic: Obtunded, no Babinski - does not withdraw from painful stimuli - opens eyes intermittently but does not follow examiner    CBC:  Recent Labs Lab 05/22/2016 1745 05/15/2016 1804 06/08/16 0448 06/09/16 0258 06/10/16 1349 06/23/2016 0230  WBC 20.0*  --  18.1* 18.4* 17.7* 18.0*  NEUTROABS 16.4*  --  14.4*  --   --   --   HGB 9.1* 10.2* 8.9* 8.1* 7.9* 7.9*  HCT 30.3* 30.0* 29.6* 26.4* 26.2* 26.7*  MCV 91.0  --  90.2 89.2 89.7 91.1  PLT 406*  --  419* 357 323 280   Basic Metabolic Panel:  Recent Labs Lab 06/08/2016 1745 05/18/2016 1804 06/08/16 0448 06/09/16 0258 06/10/16 1300 06/20/2016 0230  NA 136 135 136 137 139 137  K 4.2 4.2 4.1 3.8 3.2* 4.5  CL 97* 99* 96* 97* 100* 98*  CO2 23  --  GLUCOSE 194* 195* 176* 157* 123* 140*  BUN 17 22* 26* 37* 30* 21*  CREATININE 2.37* 2.20* 3.06* 3.99* 3.15* 2.70*  CALCIUM 8.7*  --  9.4 9.5 8.9 8.8*  MG  --   --   --  2.2  --   --   PHOS  --   --   --  5.0* 2.9  --    GFR: Estimated Creatinine Clearance: 31.4 mL/min (A) (by C-G formula based on SCr of 2.7 mg/dL (H)).  Liver Function Tests:  Recent Labs Lab 05/16/2016 1745 06/09/16 0258 06/10/16 1300 06/25/2016 0230  AST 28 17  --  18  ALT 30 25  --  22  ALKPHOS 113 101  --  116  BILITOT 1.0 0.8  --  1.9*  PROT 8.5* 8.0  --  7.9  ALBUMIN 2.1* 1.8* 1.7* 1.7*    Recent Labs Lab 05/22/2016 1828  AMMONIA 31    HbA1C: Hgb A1c MFr Bld  Date/Time Value Ref Range Status  06/09/2016 02:58 AM 6.7 (H) 4.8 - 5.6 % Final    Comment:    (NOTE)         Pre-diabetes: 5.7 - 6.4         Diabetes: >6.4         Glycemic control for adults  with diabetes: <7.0   06/08/2016 04:48 AM 6.6 (H) 4.8 - 5.6 %  Final    Comment:    (NOTE)         Pre-diabetes: 5.7 - 6.4         Diabetes: >6.4         Glycemic control for adults with diabetes: <7.0     CBG:  Recent Labs Lab 06/09/16 0804 06/09/16 1116 06/09/16 1615 06/10/16 1140 Jul 10, 2016 0833  GLUCAP 154* 164* 141* 139* 182*    Scheduled Meds: . aspirin  300 mg Rectal Daily  . chlorhexidine  15 mL Mouth Rinse BID  . insulin aspart  0-9 Units Subcutaneous Q4H  . mouth rinse  15 mL Mouth Rinse q12n4p  . metoprolol  2.5 mg Intravenous Q8H  . pneumococcal 23 valent vaccine  0.5 mL Intramuscular Tomorrow-1000  . sodium hypochlorite   Irrigation BID     LOS: 3 days    Lonia Blood, MD [facility] Office  [phone] Pager - Text Page per Amion as per below:  On-Call/Text Page:      [email or web address]      password [email or web address]  If 7PM-7AM, please contact night-coverage [email or web address] Password [email or web address] 2016/07/10, 9:14 AM

## 2016-06-11 NOTE — Progress Notes (Signed)
Subjective: Dialysis yesterday  Objective: Vital signs in last 24 hours: Temp:  [98.1 F (36.7 C)-100.2 F (37.9 C)] 99.9 F (37.7 C) (05/01 0830) Pulse Rate:  [101-119] 114 (05/01 0830) Resp:  [10-29] 26 (05/01 0830) BP: (89-131)/(41-89) 130/67 (05/01 0830) SpO2:  [94 %-99 %] 99 % (05/01 0830) Last BM Date: 06/10/16  Intake/Output from previous day: 04/30 0701 - 05/01 0700 In: 50 [IV Piggyback:50] Out: -288  Intake/Output this shift: No intake/output data recorded.  Deferred until OR - wound inspected yesterday  Lab Results:   Recent Labs  06/10/16 1349 06/12/2016 0230  WBC 17.7* 18.0*  HGB 7.9* 7.9*  HCT 26.2* 26.7*  PLT 323 280   BMET  Recent Labs  06/10/16 1300 07/05/2016 0230  NA 139 137  K 3.2* 4.5  CL 100* 98*  CO2 26 23  GLUCOSE 123* 140*  BUN 30* 21*  CREATININE 3.15* 2.70*  CALCIUM 8.9 8.8*   PT/INR No results for input(s): LABPROT, INR in the last 72 hours. ABG No results for input(s): PHART, HCO3 in the last 72 hours.  Invalid input(s): PCO2, PO2  Studies/Results: Dg Abd 1 View  Result Date: 06/10/2016 CLINICAL DATA:  Feeding tube placement. EXAM: ABDOMEN - 1 VIEW COMPARISON:  CT 05/19/2016. FINDINGS: 1039 hours. Feeding tube is looped in the stomach with the tip projecting over the gastric pylorus or proximal duodenum. The visualized bowel gas pattern is nonobstructive. IMPRESSION: Feeding tube tip projects over the gastric pylorus or proximal duodenum. Electronically Signed   By: Carey Bullocks M.D.   On: 06/10/2016 10:51   Ct Femur Left Wo Contrast  Result Date: 06/09/2016 CLINICAL DATA:  Left posterior thigh wound s/p debridement. Now has climbing WBC and low grade fever as well as concern for endocarditis. EXAM: CT OF THE LEFT FEMUR WITHOUT CONTRAST CT OF THE LEFT TIBIA AND FIBULA WITHOUT CONTRAST TECHNIQUE: Multidetector CT imaging of the left tibia and fibula was performed according to the standard protocol. Multidetector CT imaging  of the left femur was performed according to the standard protocol. COMPARISON:  None. FINDINGS: Bones/Joint/Cartilage No acute fracture or dislocation of the left femur. No acute fracture or dislocation of the left tibia or fibula. No periosteal reaction or bone destruction. No aggressive lytic or sclerotic osseous lesion. Normal alignment. No joint effusion. Tricompartmental mild osteoarthritis of the left knee with small marginal osteophytes. Mild osteoarthritis of the tibiotalar joint. Mild osteoarthritis of the talonavicular joint. Mild enthesopathic changes of the Achilles tendon insertion. Tiny plantar calcaneal spur. Generalized osteopenia. Ligaments Ligaments are suboptimally evaluated by CT. ACL and PCL are grossly intact. Muscles and Tendons Generalized muscle atrophy throughout the thigh and lower leg. Intact quadriceps tendon and patellar tendon. Flexor, extensor, peroneal and Achilles tendons are grossly intact. No intramuscular fluid collection or hematoma. Soft tissue Soft tissue wound in the upper medial left thigh. 1.8 x 3.6 x 4.2 cm complex fluid collection underlying the wound within the subcutaneous fat with surrounding fat stranding most concerning for an abscess or hematoma. Soft tissue wound along the inferior medial left thigh with a wound-vac present. Surrounding skin thickening and fat stranding within the subcutaneous fat most concerning for cellulitis. No soft tissue mass. Extensive peripheral vascular atherosclerotic disease. IMPRESSION: 1. Soft tissue wound along the inferior medial left thigh with a wound-vac present. Surrounding skin thickening and fat stranding within the subcutaneous fat most concerning for cellulitis. No adjacent fluid collection to suggest an abscess. 2. Soft tissue wound in the upper medial left thigh.  1.8 x 3.6 x 4.2 cm complex fluid collection underlying the wound within the subcutaneous fat with surrounding fat stranding most concerning for an abscess or  hematoma. This area is just inferior to the inguinal region. 3. No acute osseous abnormality of the left femur, left tibia or left fibula. 4. Mild tricompartmental osteoarthritis of the left knee. 5. Mild osteoarthritis of the tibiotalar joint and talonavicular joint. Electronically Signed   By: Elige Ko   On: 06/09/2016 17:26   Ct Tibia Fibula Left Wo Contrast  Result Date: 06/09/2016 CLINICAL DATA:  Left posterior thigh wound s/p debridement. Now has climbing WBC and low grade fever as well as concern for endocarditis. EXAM: CT OF THE LEFT FEMUR WITHOUT CONTRAST CT OF THE LEFT TIBIA AND FIBULA WITHOUT CONTRAST TECHNIQUE: Multidetector CT imaging of the left tibia and fibula was performed according to the standard protocol. Multidetector CT imaging of the left femur was performed according to the standard protocol. COMPARISON:  None. FINDINGS: Bones/Joint/Cartilage No acute fracture or dislocation of the left femur. No acute fracture or dislocation of the left tibia or fibula. No periosteal reaction or bone destruction. No aggressive lytic or sclerotic osseous lesion. Normal alignment. No joint effusion. Tricompartmental mild osteoarthritis of the left knee with small marginal osteophytes. Mild osteoarthritis of the tibiotalar joint. Mild osteoarthritis of the talonavicular joint. Mild enthesopathic changes of the Achilles tendon insertion. Tiny plantar calcaneal spur. Generalized osteopenia. Ligaments Ligaments are suboptimally evaluated by CT. ACL and PCL are grossly intact. Muscles and Tendons Generalized muscle atrophy throughout the thigh and lower leg. Intact quadriceps tendon and patellar tendon. Flexor, extensor, peroneal and Achilles tendons are grossly intact. No intramuscular fluid collection or hematoma. Soft tissue Soft tissue wound in the upper medial left thigh. 1.8 x 3.6 x 4.2 cm complex fluid collection underlying the wound within the subcutaneous fat with surrounding fat stranding most  concerning for an abscess or hematoma. Soft tissue wound along the inferior medial left thigh with a wound-vac present. Surrounding skin thickening and fat stranding within the subcutaneous fat most concerning for cellulitis. No soft tissue mass. Extensive peripheral vascular atherosclerotic disease. IMPRESSION: 1. Soft tissue wound along the inferior medial left thigh with a wound-vac present. Surrounding skin thickening and fat stranding within the subcutaneous fat most concerning for cellulitis. No adjacent fluid collection to suggest an abscess. 2. Soft tissue wound in the upper medial left thigh. 1.8 x 3.6 x 4.2 cm complex fluid collection underlying the wound within the subcutaneous fat with surrounding fat stranding most concerning for an abscess or hematoma. This area is just inferior to the inguinal region. 3. No acute osseous abnormality of the left femur, left tibia or left fibula. 4. Mild tricompartmental osteoarthritis of the left knee. 5. Mild osteoarthritis of the tibiotalar joint and talonavicular joint. Electronically Signed   By: Elige Ko   On: 06/09/2016 17:26    Anti-infectives: Anti-infectives    Start     Dose/Rate Route Frequency Ordered Stop   06/10/16 1200  vancomycin (VANCOCIN) IVPB 1000 mg/200 mL premix     1,000 mg 200 mL/hr over 60 Minutes Intravenous Every M-W-F (Hemodialysis) 06/10/16 0851     06/10/16 1000  piperacillin-tazobactam (ZOSYN) IVPB 3.375 g     3.375 g 12.5 mL/hr over 240 Minutes Intravenous Every 12 hours 06/10/16 0851     06/10/16 1000  vancomycin (VANCOCIN) 2,250 mg in sodium chloride 0.9 % 500 mL IVPB  Status:  Discontinued     2,250 mg 250  mL/hr over 120 Minutes Intravenous Every 12 hours 06/10/16 0851 06/10/16 0858   06/10/16 1000  vancomycin (VANCOCIN) 2,250 mg in sodium chloride 0.9 % 500 mL IVPB     2,250 mg 250 mL/hr over 120 Minutes Intravenous  Once 06/10/16 0858 06/10/16 1640      Assessment/Plan: To OR today for debridement of Left  thigh abscess.  Procedure discussed with family member yesterday.  LOS: 3 days    [name] K. 06/24/2016

## 2016-06-11 NOTE — Anesthesia Preprocedure Evaluation (Addendum)
Anesthesia Evaluation  Patient identified by MRN, date of birth, ID band Patient confused    Reviewed: Allergy & Precautions, NPO status , Patient's Chart, lab work & pertinent test results  History of Anesthesia Complications Negative for: history of anesthetic complications  Airway Mallampati: II  TM Distance: >3 FB Neck ROM: Full    Dental   Pulmonary asthma , pneumonia,    breath sounds clear to auscultation       Cardiovascular hypertension, +CHF   Rhythm:Regular  Study Conclusions  - Left ventricle: The cavity size was normal. There was moderate   concentric hypertrophy. Systolic function was normal. The   estimated ejection fraction was in the range of 55% to 60%. Wall   motion was normal; there were no regional wall motion   abnormalities. Doppler parameters are consistent with abnormal   left ventricular relaxation (grade 1 diastolic dysfunction).   There was no evidence of elevated ventricular filling pressure by   Doppler parameters. - Aortic valve: Trileaflet; normal thickness leaflets. There was no   regurgitation.   Neuro/Psych PSYCHIATRIC DISORDERS Depression CVA    GI/Hepatic Neg liver ROS, GERD  ,  Endo/Other  diabetesMorbid obesity  Renal/GU Dialysis and ESRFRenal disease     Musculoskeletal   Abdominal   Peds  Hematology  (+) anemia ,   Anesthesia Other Findings   Reproductive/Obstetrics                            Anesthesia Physical Anesthesia Plan  ASA: III  Anesthesia Plan: General   Post-op Pain Management:    Induction: Intravenous  Airway Management Planned: Oral ETT  Additional Equipment:   Intra-op Plan:   Post-operative Plan: Extubation in OR and Possible Post-op intubation/ventilation  Informed Consent: I have reviewed the patients History and Physical, chart, labs and discussed the procedure including the risks, benefits and alternatives for  the proposed anesthesia with the patient or authorized representative who has indicated his/her understanding and acceptance.   Dental advisory given  Plan Discussed with: CRNA and Surgeon  Anesthesia Plan Comments:        Anesthesia Quick Evaluation

## 2016-06-11 NOTE — Transfer of Care (Signed)
Immediate Anesthesia Transfer of Care Note  Patient: [name]  Procedure(s) Performed: Procedure(s): DEBRIDEMENT OF LEFT THIGH ABSCESS (Left)  Patient Location: PACU  Anesthesia Type:General  Level of Consciousness: lethargic and obtunded  Airway & Oxygen Therapy: Patient Spontanous Breathing and Patient connected to face mask oxygen  Post-op Assessment: Report given to RN, Post -op Vital signs reviewed and stable and Patient moving all extremities  Post vital signs: Reviewed and stable  Last Vitals:  Vitals:   07/08/2016 0900 06/24/2016 1348  BP:  (!) 95/53  Pulse: (!) 113 97  Resp: (!) 30 17  Temp:  36.6 C    Last Pain:  Vitals:   06/15/2016 1348  TempSrc:   PainSc: 0-No pain         Complications: No apparent anesthesia complications

## 2016-06-11 DEATH — deceased

## 2016-06-12 ENCOUNTER — Encounter (HOSPITAL_COMMUNITY): Payer: Self-pay | Admitting: Surgery

## 2016-06-12 ENCOUNTER — Inpatient Hospital Stay (HOSPITAL_COMMUNITY): Payer: Medicaid Other

## 2016-06-12 DIAGNOSIS — I639 Cerebral infarction, unspecified: Secondary | ICD-10-CM

## 2016-06-12 LAB — CBC
HEMATOCRIT: 30.2 % — AB (ref 36.0–46.0)
Hemoglobin: 9 g/dL — ABNORMAL LOW (ref 12.0–15.0)
MCH: 27.1 pg (ref 26.0–34.0)
MCHC: 29.8 g/dL — AB (ref 30.0–36.0)
MCV: 91 fL (ref 78.0–100.0)
PLATELETS: ADEQUATE 10*3/uL (ref 150–400)
RBC: 3.32 MIL/uL — ABNORMAL LOW (ref 3.87–5.11)
RDW: 18.6 % — AB (ref 11.5–15.5)
WBC: 12.4 10*3/uL — ABNORMAL HIGH (ref 4.0–10.5)

## 2016-06-12 LAB — PHOSPHORUS: Phosphorus: 5.1 mg/dL — ABNORMAL HIGH (ref 2.5–4.6)

## 2016-06-12 LAB — COMPREHENSIVE METABOLIC PANEL
ALK PHOS: 167 U/L — AB (ref 38–126)
ALT: 36 U/L (ref 14–54)
ANION GAP: 16 — AB (ref 5–15)
AST: 48 U/L — ABNORMAL HIGH (ref 15–41)
Albumin: 1.8 g/dL — ABNORMAL LOW (ref 3.5–5.0)
BILIRUBIN TOTAL: 1.2 mg/dL (ref 0.3–1.2)
BUN: 39 mg/dL — ABNORMAL HIGH (ref 6–20)
CALCIUM: 9.3 mg/dL (ref 8.9–10.3)
CO2: 24 mmol/L (ref 22–32)
CREATININE: 3.62 mg/dL — AB (ref 0.44–1.00)
Chloride: 97 mmol/L — ABNORMAL LOW (ref 101–111)
GFR, EST AFRICAN AMERICAN: 17 mL/min — AB (ref >60)
GFR, EST NON AFRICAN AMERICAN: 15 mL/min — AB (ref >60)
Glucose, Bld: 272 mg/dL — ABNORMAL HIGH (ref 65–99)
Potassium: 5.4 mmol/L — ABNORMAL HIGH (ref 3.5–5.1)
Sodium: 137 mmol/L (ref 135–145)
TOTAL PROTEIN: 8.3 g/dL — AB (ref 6.5–8.1)

## 2016-06-12 LAB — GLUCOSE, CAPILLARY: GLUCOSE-CAPILLARY: 294 mg/dL — AB (ref 65–99)

## 2016-06-12 MED ORDER — VANCOMYCIN HCL IN DEXTROSE 1-5 GM/200ML-% IV SOLN
INTRAVENOUS | Status: AC
  Filled 2016-06-12: qty 200

## 2016-06-12 MED ORDER — DEXTROSE 5 % IV SOLN
1.0000 g | INTRAVENOUS | Status: DC
  Administered 2016-06-12 – 2016-06-16 (×4): 1 g via INTRAVENOUS
  Filled 2016-06-12 (×7): qty 1

## 2016-06-12 NOTE — Progress Notes (Signed)
Pharmacy Antibiotic Note  [name] is a 41 y.o. female admitted on 05/27/2016 with sepsis, possible vegetation seen on ECHO. Awaiting TEE. Patient has ESRD, dialyzes MWF- currently in HD, on schedule. Has been tolerating full session  Plan: Zosyn 3.375g IV q12h Vancomycin 1g IV qHD-MWF Monitor c/s, TEE results, HD schedule/tolerance, VR prn  Height:  (157.5 cm) Weight: 226 lb 6.6 oz (102.7 kg) IBW/kg (Calculated) : 50.1  Temp (24hrs), Avg:98.2 F (36.8 C), Min:97.7 F (36.5 C), Max:98.9 F (37.2 C)   Recent Labs Lab 06/08/16 0047 06/08/16 0448 06/09/16 0258 06/10/16 1300 06/10/16 1349 Jun 25, 2016 0230 06/25/16 1413 06/12/16 0236  WBC  --  18.1* 18.4*  --  17.7* 18.0*  --  12.4*  CREATININE  --  3.06* 3.99* 3.15*  --  2.70*  --  3.62*  LATICACIDVEN 2.0* 1.5  --   --   --   --  1.4  --     Estimated Creatinine Clearance: 23.4 mL/min (A) (by C-G formula based on SCr of 3.62 mg/dL (H)).    Allergies  Allergen Reactions  . Azithromycin Other (See Comments)    Nose bleeding event   Vancomycin 4/30>> Zosyn 4/30>>  RPR Reactive- T Pallidum Abx neg, likely false positive RPR 4/30 BCx >>ngtd 5/1 LLE abscess >>few GNR  [name], PharmD, BCPS Clinical Pharmacist Pager: [phone] 06/12/2016 11:46 AM

## 2016-06-12 NOTE — Clinical Social Work Note (Signed)
Clinical Social Work Assessment  Patient Details  Name: [name] MRN: [id number] Date of Birth: 1975/09/28  Date of referral:  06/12/16               Reason for consult:  Facility Placement, Discharge Planning                Permission sought to share information with:  Facility Medical sales representative, Family Supports Permission granted to share information::  Yes, Verbal Permission Granted  Name::     [name]  Agency::  [facility]  Relationship::  Husband  Contact Information:  [phone]  Housing/Transportation Living arrangements for the past 2 months:  Skilled Nursing Facility Source of Information:  Medical Team, Spouse Patient Interpreter Needed:  None Criminal Activity/Legal Involvement Pertinent to Current Situation/Hospitalization:  No - Comment as needed Significant Relationships:  Spouse, Siblings Lives with:  Facility Resident Do you feel safe going back to the place where you live?  Yes Need for family participation in patient care:  Yes (Comment)  Care giving concerns:  Patient was admitted from [facility].   Social Worker assessment / plan:  Patient not oriented. CSW called patient's husband. Patient's sister answered the phone and stated that the patient's husband was not home. Patient's sister wanted to know why CSW could not talk to her and was upset that no one had called her to update her. CSW explained that legally the CSW has to speak with the patient's husband first and if he gives permission for CSW to provide her with information, that can be done. Patient's sister then put her husband on the phone. CSW introduced role and explained that discharge planning would be discussed. Patient's husband confirmed that she was admitted from [facility] and the plan is for her to return once stable for discharge. Patient's husband asked how she was doing. CSW told him that she is in dialysis right now and he would have to call her nurse for a  more specific update. Patient's husband expressed understanding. Patient currently has a cortrack and cannot discharge to SNF with that. No further concerns. CSW encouraged patient's husband to call CSW as needed. CSW will continue to follow patient and her husband for support and facilitate discharge to SNF once medically stable.  Employment status:  Disabled (Comment on whether or not currently receiving Disability) Insurance information:  Medicaid In [place] PT Recommendations:  Skilled Nursing Facility Information / Referral to community resources:  Skilled Nursing Facility  Patient/Family's Response to care:  Patient not oriented. Patient's husband agreeable to return to SNF. Patient's husband supportive and involved in patient's care. Patient's husband appreciated social work intervention.  Patient/Family's Understanding of and Emotional Response to Diagnosis, Current Treatment, and Prognosis:  Patient not oriented. Patient's husband has a good understanding of the reason for admission and the need for her to return to SNF. Patient's husband discussed wound care. Patient's husband appears pleased with hospital care.  Emotional Assessment Appearance:  Appears stated age Attitude/Demeanor/Rapport:  Unable to Assess Affect (typically observed):  Unable to Assess Orientation:    Alcohol / Substance use:  Never Used Psych involvement (Current and /or in the community):  No (Comment)  Discharge Needs  Concerns to be addressed:  Care Coordination Readmission within the last 30 days:  No Current discharge risk:  Cognitively Impaired, Dependent with Mobility Barriers to Discharge:  Continued Medical Work up, Other (Cortrack)   Margarito Liner, LCSW 06/12/2016, 11:10 AM

## 2016-06-12 NOTE — Progress Notes (Addendum)
INFECTIOUS DISEASE PROGRESS NOTE  ID: [name] is a 41 y.o. female with  Principal Problem:   CVA (cerebral vascular accident) (HCC) Active Problems:   Morbid obesity (HCC)   Essential hypertension   Acute metabolic encephalopathy   Uncontrolled diabetes mellitus with diabetic nephropathy, with long-term current use of insulin (HCC)   ESRD (end stage renal disease) (HCC)   Depression due to physical illness   Wound abscess  Subjective: Patient remains non-verbal.   Abtx:  Anti-infectives    Start     Dose/Rate Route Frequency Ordered Stop   06/12/16 0959  vancomycin (VANCOCIN) 1-5 GM/200ML-% IVPB    Comments:  Carlyon Prows   : cabinet override      06/12/16 0959 06/12/16 2214   06/10/16 1200  vancomycin (VANCOCIN) IVPB 1000 mg/200 mL premix     1,000 mg 200 mL/hr over 60 Minutes Intravenous Every M-W-F (Hemodialysis) 06/10/16 0851     06/10/16 1000  piperacillin-tazobactam (ZOSYN) IVPB 3.375 g     3.375 g 12.5 mL/hr over 240 Minutes Intravenous Every 12 hours 06/10/16 0851     06/10/16 1000  vancomycin (VANCOCIN) 2,250 mg in sodium chloride 0.9 % 500 mL IVPB  Status:  Discontinued     2,250 mg 250 mL/hr over 120 Minutes Intravenous Every 12 hours 06/10/16 0851 06/10/16 0858   06/10/16 1000  vancomycin (VANCOCIN) 2,250 mg in sodium chloride 0.9 % 500 mL IVPB     2,250 mg 250 mL/hr over 120 Minutes Intravenous  Once 06/10/16 0858 06/10/16 1640      Medications:  Scheduled: . aspirin  300 mg Rectal Daily  . chlorhexidine  15 mL Mouth Rinse BID  . feeding supplement (NEPRO CARB STEADY)  1,000 mL Oral Q24H  . feeding supplement (PRO-STAT SUGAR FREE 64)  30 mL Per Tube TID  . heparin  4,400 Units Dialysis Once in dialysis  . insulin aspart  0-9 Units Subcutaneous Q4H  . mouth rinse  15 mL Mouth Rinse q12n4p  . metoprolol  5 mg Intravenous Q8H  . pneumococcal 23 valent vaccine  0.5 mL Intramuscular Tomorrow-1000  . sodium hypochlorite   Irrigation BID     Objective: Vital signs in last 24 hours: Temp:  [97.7 F (36.5 C)-98.9 F (37.2 C)] 98.6 F (37 C) (05/02 0730) Pulse Rate:  [90-109] 107 (05/02 1030) Resp:  [14-23] 16 (05/02 1030) BP: (91-150)/(46-92) 130/80 (05/02 1030) SpO2:  [96 %-100 %] 100 % (05/02 1030)   General appearance: no distress and obese woman laying in bed. Non-verbal, no purposeful movements aside from withdrawing from pain  Eyes: conjunctivae/corneas clear. PERRL, EOM's intact. Fundi benign. Resp: clear to auscultation bilaterally Chest wall: no tenderness, HD cath left chest clean/dry. Site WNL Cardio: S1, S2 normal, no rub and no murmur GI: soft, non-tender; bowel sounds normal; no masses,  no organomegaly Extremities: no edema. LUE AVG +/+.  Skin: large surgical wound left medial thigh with clean/intact wound vac.  Neurologic: Mental status: Alert with eyes open. Non-verbal.  Incision/Wound: left medial thigh as above.   Lab Results  Recent Labs  06-18-2016 0230 06/12/16 0236  WBC 18.0* 12.4*  HGB 7.9* 9.0*  HCT 26.7* 30.2*  NA 137 137  K 4.5 5.4*  CL 98* 97*  CO2 23 24  BUN 21* 39*  CREATININE 2.70* 3.62*   Liver Panel  Recent Labs  06/18/16 0230 06/12/16 0236  PROT 7.9 8.3*  ALBUMIN 1.7* 1.8*  AST 18 48*  ALT 22 36  ALKPHOS 116 167*  BILITOT 1.9* 1.2   Sedimentation Rate No results for input(s): ESRSEDRATE in the last 72 hours. C-Reactive Protein No results for input(s): CRP in the last 72 hours.  Microbiology: Recent Results (from the past 240 hour(s))  Culture, blood (Routine X 2) w Reflex to ID Panel     Status: None (Preliminary result)   Collection Time: 06/10/16 12:30 PM  Result Value Ref Range Status   Specimen Description BLOOD RIGHT HEMODIALYSIS CATHETER  Final   Special Requests   Final    BOTTLES DRAWN AEROBIC AND ANAEROBIC Blood Culture results may not be optimal due to an excessive volume of blood received in culture bottles   Culture NO GROWTH 2 DAYS  Final    Report Status PENDING  Incomplete  Culture, blood (Routine X 2) w Reflex to ID Panel     Status: None (Preliminary result)   Collection Time: 06/10/16 12:45 PM  Result Value Ref Range Status   Specimen Description BLOOD RIGHT URINE, CATHETERIZED  Final   Special Requests   Final    BOTTLES DRAWN AEROBIC AND ANAEROBIC Blood Culture adequate volume   Culture NO GROWTH 2 DAYS  Final   Report Status PENDING  Incomplete  Aerobic/Anaerobic Culture (surgical/deep wound)     Status: None (Preliminary result)   Collection Time: 06/16/2016  1:04 PM  Result Value Ref Range Status   Specimen Description WOUND  Final   Special Requests LEFT THIGH ABSCESS  Final   Gram Stain NO WBC SEEN FEW GRAM NEGATIVE RODS   Final   Culture PENDING  Incomplete   Report Status PENDING  Incomplete    Studies/Results: No results found.   Assessment/Plan: ESRD - HD completed today, no fluid off  Possible IE -recent acute multifocal CVAs and fever -BCx 4/30 >> NGTD -Empiric coverage with Vanc/Zosyn started 4/30  Consider changing Zosyn to Cefepime  -Await TEE - scheduled 5/4  Left Thigh Wound -s/p debriedment 05/21/16 (poly microbial) -5/1 Debridement >> few GNR   - await ID  Reactive RPR 1:1 - HIV (-) -T Pallidum Abs negative - Followed up with state health dept, will treat as false positive.   Severe, protein calorie malnutrition -nepro TF with prostat started 5/1. Not currently infusing in HD.  - can HD line be pulled?  Total days of antibiotics: 4 vanco/zosyn                                                                           Rexene Alberts Infectious Diseases (pager) [phone] [email or web address] 06/12/2016, 11:45 AM  LOS: 4 days

## 2016-06-12 NOTE — Progress Notes (Signed)
Pharmacy Antibiotic Note  [name] is a 41 y.o. female admitted on 06/03/2016 with sepsis, possible vegetation seen on ECHO. Awaiting TEE. Patient has ESRD, dialyzes MWF- currently in HD, on schedule. Has been tolerating full session.  PM f/u > pharmacy asked to change Zosyn to Cefepime per ID recommendations.  Plan: Cefepime 1g IV q 24 hrs. Vancomycin 1g IV qHD-MWF Monitor c/s, TEE results, HD schedule/tolerance, VR prn  Height:  (157.5 cm) Weight: 226 lb 6.6 oz (102.7 kg) IBW/kg (Calculated) : 50.1  Temp (24hrs), Avg:98.4 F (36.9 C), Min:97.7 F (36.5 C), Max:98.9 F (37.2 C)   Recent Labs Lab 06/08/16 0047 06/08/16 0448 06/09/16 0258 06/10/16 1300 06/10/16 1349 28-Jun-2016 0230 2016-06-28 1413 06/12/16 0236  WBC  --  18.1* 18.4*  --  17.7* 18.0*  --  12.4*  CREATININE  --  3.06* 3.99* 3.15*  --  2.70*  --  3.62*  LATICACIDVEN 2.0* 1.5  --   --   --   --  1.4  --     Estimated Creatinine Clearance: 23.4 mL/min (A) (by C-G formula based on SCr of 3.62 mg/dL (H)).    Allergies  Allergen Reactions  . Azithromycin Other (See Comments)    Nose bleeding event   Vancomycin 4/30>> Zosyn 4/30>>  RPR Reactive- T Pallidum Abx neg, likely false positive RPR 4/30 BCx >>ngtd 5/1 LLE abscess >>few GNR  Tad Moore, BCPS  Clinical Pharmacist Pager [phone]  06/12/2016 4:50 PM

## 2016-06-12 NOTE — Progress Notes (Signed)
*  PRELIMINARY RESULTS* Vascular Ultrasound Carotid Duplex (Doppler) has been completed.  Preliminary findings: Right ICA chronically occluded. Right vertebral antegrade flow. Left ICA 1-39%. Left vertebral flow not visualized.   Farrel Demark, RDMS, RVT  06/12/2016, 1:59 PM

## 2016-06-12 NOTE — Progress Notes (Signed)
[facility] Progress Note   Subjective: sp OR yesterday w/ extension of debridement up the medial L thigh w VAC replacement  Vitals:   07/05/2016 2311 06/12/16 0231 06/12/16 0400 06/12/16 0719  BP: 110/66 135/89 (!) 127/57   Pulse: 90 (!) 101 98 95  Resp: (!) Temp: 97.7 F (36.5 C) 98.9 F (37.2 C)    TempSrc: Axillary Axillary    SpO2: 100% 100% 100% 100%  Weight:      Height:        Inpatient medications: . aspirin  300 mg Rectal Daily  . chlorhexidine  15 mL Mouth Rinse BID  . feeding supplement (NEPRO CARB STEADY)  1,000 mL Oral Q24H  . feeding supplement (PRO-STAT SUGAR FREE 64)  30 mL Per Tube TID  . heparin  4,400 Units Dialysis Once in dialysis  . insulin aspart  0-9 Units Subcutaneous Q4H  . mouth rinse  15 mL Mouth Rinse q12n4p  . metoprolol  5 mg Intravenous Q8H  . pneumococcal 23 valent vaccine  0.5 mL Intramuscular Tomorrow-1000  . sodium hypochlorite   Irrigation BID   . sodium chloride    . sodium chloride    . piperacillin-tazobactam (ZOSYN)  IV Stopped (06/12/16 9563)  . vancomycin Stopped (06/13/2016 1546)   sodium chloride, sodium chloride, [DISCONTINUED] acetaminophen **OR** [DISCONTINUED] acetaminophen (TYLENOL) oral liquid 160 mg/5 mL **OR** acetaminophen, alteplase, heparin, lidocaine (PF), lidocaine-prilocaine, pentafluoroprop-tetrafluoroeth  Exam: Not responding, obese, temporal musc wasting No jvd Chest clear bilat RRR tachy no RG ABd obese soft nondistended Ext - wound VAC L thigh, some dark blistering superior to wound, no edema; RLE no edema LUA AVG+bruit R IJ cath Neuro- is not responding to voice/ stimulation  Dialysis:  [facility]  4:30 hours, 400/800  105.5kg   3K/2Ca   TDC R IJ/ maturing LUA AVG  Hep 4400 - Mircera IV q 2 weeks (last given 4/25) - Calcitriol 0.26mcg PO q HD  Assessment: 1. Acute multifocal CVAs: Plan per neuro and intensivist 2. AMS: poorly responsive, CVA +/- infection 3. L  thigh soft-tissue infection: sp I&D 4/10 and repeat I&D 5/2 yesterday by gen surg. VAC in place, IV abx vanc/ zosyn.  4. Abnormal ECHO: Possible vegetation on L coronary cusp. TEE planned. 1/6 +bcx's for CNSS was felt to be contaminant most likely per ID.  5. ESRD: MWF HD. Has L arm AVG placed 4/3, using today w 16 ga 6. HTN: home coreg on hold, getting q 8hr IV metoprolol for now 7. Volume: under dry wt 2kg, no excess on exam 8. Anemia: Hgb 8.1, just got mircera 150 ug on 4/25. Transfuse prn. Last tsat 4/19 not calc due to low transferrin. Ferritin 450 > 1000> 3100 over last 3 mos, poss d/t leg infection.   9. Metabolic bone disease: CorrCa high, Phos ok. Holding VDRA/ binders for now 10. Nutrition: Alb very low, Cortrak is in now for TF   Plan - HD today, no UF, using AVG. Will get cath out .    Vinson Moselle MD [facility] pager [phone]   06/12/2016, 8:54 AM    Recent Labs Lab 06/09/16 0258 06/10/16 1300 07/11/2016 0230 06/12/16 0236 06/12/16 0532  NA 137 139 137 137  --   K 3.8 3.2* 4.5 5.4*  --   CL 97* 100* 98* 97*  --   CO2 --   GLUCOSE 157* 123* 140* 272*  --   BUN  37* 30* 21* 39*  --   CREATININE 3.99* 3.15* 2.70* 3.62*  --   CALCIUM 9.5 8.9 8.8* 9.3  --   PHOS 5.0* 2.9  --   --  5.1*    Recent Labs Lab 06/09/16 0258 06/10/16 1300 06/28/2016 0230 06/12/16 0236  AST 17  --  18 48*  ALT 25  --  22 36  ALKPHOS 101  --  116 167*  BILITOT 0.8  --  1.9* 1.2  PROT 8.0  --  7.9 8.3*  ALBUMIN 1.8* 1.7* 1.7* 1.8*    Recent Labs Lab 05/24/2016 1745  06/08/16 0448  06/10/16 1349 07/11/2016 0230 06/12/16 0236  WBC 20.0*  --  18.1*  < > 17.7* 18.0* 12.4*  NEUTROABS 16.4*  --  14.4*  --   --   --   --   HGB 9.1*  < > 8.9*  < > 7.9* 7.9* 9.0*  HCT 30.3*  < > 29.6*  < > 26.2* 26.7* 30.2*  MCV 91.0  --  90.2  < > 89.7 91.1 91.0  PLT 406*  --  419*  < > 323 280 PLATELET CLUMPS NOTED ON SMEAR, COUNT APPEARS ADEQUATE  < > = values in this  interval not displayed. Iron/TIBC/Ferritin/ %Sat    Component Value Date/Time   IRON 32 05/30/2016 1002   TIBC NOT CALCULATED 05/30/2016 1002   FERRITIN 3,194 (H) 05/30/2016 1002   IRONPCTSAT NOT CALCULATED 05/30/2016 1002

## 2016-06-12 NOTE — Progress Notes (Signed)
Physical Therapy Cancellation Note   06/12/16 0906  PT Visit Information  Last PT Received On 06/12/16  Reason Eval/Treat Not Completed Patient at procedure or test/unavailable; Pt in HD. PT will check on pt later as time allows.   History of Present Illness Patient is a 41 yo female admitted 05/21/2016 with AMS from [facility].  Patient with Rt infarcts, occluded MCA, Rt ICA, and Lt vertebral artery. No verbalizations or voluntary movement.   PMH:  CVA with RLE weak, DM, obesity, chronic lymphedema, wound Rt calf, VAC on Lt thigh, HTN, CHF, depression, ESRD on HD, asthma  Erline Levine, PTA Pager: [phone]

## 2016-06-12 NOTE — Progress Notes (Signed)
PT Cancellation Note  Patient Details Name: [name] MRN: [id number] DOB: 01-12-1976   Cancelled Treatment:    Reason Eval/Treat Not Completed: Patient at procedure or test/unavailable Pt being transferred off unit for vascular US. PT will continue to follow acutely.    Derek Mound, PTA Pager: [phone]   06/12/2016, 2:13 PM

## 2016-06-12 NOTE — Progress Notes (Signed)
[facility] TEAM 1 - Stepdown/ICU TEAM  Solenne Manwarren  EAV:[id number] DOB: 20-May-1975 DOA: 06/06/2016 PCP: Marletta Lor, NP    Brief Narrative:  41 y.o.woman with a history of CVA, DM2, morbid obesity, chronic lymphedema, HTN, chronic diastolic heart failure, depression, and admission 3/21 > 4/20 for acute encephalopathy, acute renal failure on CKD 5 requiring HD, decompensated heart failure, and infected leg ulcer.  She was discharged to a SNF.  She had been dialyzing, presumably on MWF.  Upon completion of her HD session on the day of presentation, she was noted to have mental status changes.  It was not clear to the personnel when she was last known normal.  She was transferred to the ED where she had a WBC count of 20, Hgb 9, Ammonia level normal.  Head CT was negative for acute process.  MRI/MRA noted acute right deep gray nuclei and right hippocampal infarcts with an occluded R MCA, Right ICA, and Left vertebral artery.  Subjective: Pt remains essentially obtunded.  Doesn't communicate. No acute events overnight.   Assessment & Plan:  Acute metabolic encephalopathy in setting of chronic encephalopathy During last admit mental status apparently waxed and waned - Psych saw in consultation and suggested tx for depression  - suspect her acute change is due to TME in setting of sepsis related to her L thigh wound and possible SBE -Cont vanc and zosyn. Will change Zosyn to cefepime.  - cont supportive care   L posterior/medial thigh wound s/p debridement by Len Childs 05/21/16 - completed a course of abx tx on 05/31/16  - CT of the thigh has noted a new area c/w an abscess not communicating with the currently opened area  - Gen Surgery- Excision of skin and subcutaneous tissue of left thigh (25 x 8 x 3 cm) on 5/1 - broad abx coverage to include MRSA coverage to continue   SBE - Possible 7x76mm vegetation L coronary cusp  Blood cx during last admit 1 of 6 + for coag neg Staph strongly  suggestive of contaminant - no such mass noted on TTE 03/22/16  -Cardiology consulted for TEE. Pending eval.   Acute, multifocal CVAs with history of multiple prior CVAs Concerns for cardiac lesion as seen on echocardiogram which could likely be the source of her stroke She will need TEE I have consulted cardiology this morning. Await their input.  ESRD on HD Nephrology following - for HD M/W/F   Reactive RPR Quant f/u RPR 1:1 w/ negative T pallidum abs - ?false negative - ID following   Morbid obesity - Body mass index is 41.41 kg/m.   Nutrition  Cortrak for nutrition  HTN BP well controlled/stable   DM CBG reasonably well controlled - cont to follow Q4hr  Chronic diastolic congestive heart failure TTE 4/28 noted EF 55-60% w/ grade 1 DD - EDW per HD center is 105.5kg  Filed Weights   06/08/16 0421 06/09/16 0500  Weight: 105.3 kg (232 lb 2.3 oz) 102.7 kg (226 lb 6.6 oz)    DVT prophylaxis: SCDs Code Status: FULL CODE Family Communication: no family present at time of exam. Spoke with her sister over the phone.   Disposition Plan: SDU  Consultants:  Neurology  Gen Surgery Nephrology   Procedures: none  Antimicrobials:  Zosyn 4/30 > Vanc 4/30 >  Objective: Blood pressure 108/86, pulse (!) 105, temperature 98.3 F (36.8 C), temperature source Axillary, resp. rate (!) 24, height  (1.575 m), weight 102.7 kg (226 lb  6.6 oz), SpO2 100 %.  Intake/Output Summary (Last 24 hours) at 06/12/16 1627 Last data filed at 06/12/16 1200  Gross per 24 hour  Intake              476 ml  Output                0 ml  Net              476 ml   Filed Weights   06/08/16 0421 06/09/16 0500  Weight: 105.3 kg (232 lb 2.3 oz) 102.7 kg (226 lb 6.6 oz)    Examination: General: No acute respiratory distress - obtunded  Lungs: Clear to auscultation bilaterally without wheeze - distant BS related to body habitus  Cardiovascular: Regular rate and rhythm without murmur - distant HS    Abdomen: Obese, soft, bowel sounds present, no organomegaly, no rebound - Cortrak in L nare Extremities: L medial thigh wound dressed and presently dry - change of chronic venous statis dermatitis noted B Neurologic: Obtunded, no Babinski - does not withdraw from painful stimuli - opens eyes intermittently but does not follow examiner    CBC:  Recent Labs Lab 05/23/2016 1745  06/08/16 0448 06/09/16 0258 06/10/16 1349 06/25/2016 0230 06/12/16 0236  WBC 20.0*  --  18.1* 18.4* 17.7* 18.0* 12.4*  NEUTROABS 16.4*  --  14.4*  --   --   --   --   HGB 9.1*  < > 8.9* 8.1* 7.9* 7.9* 9.0*  HCT 30.3*  < > 29.6* 26.4* 26.2* 26.7* 30.2*  MCV 91.0  --  90.2 89.2 89.7 91.1 91.0  PLT 406*  --  419* 357 323 280 PLATELET CLUMPS NOTED ON SMEAR, COUNT APPEARS ADEQUATE  < > = values in this interval not displayed. Basic Metabolic Panel:  Recent Labs Lab 06/08/16 0448 06/09/16 0258 06/10/16 1300 07/07/2016 0230 06/12/16 0236 06/12/16 0532  NA 136 137 139 137 137  --   K 4.1 3.8 3.2* 4.5 5.4*  --   CL 96* 97* 100* 98* 97*  --   CO2 --   GLUCOSE 176* 157* 123* 140* 272*  --   BUN 26* 37* 30* 21* 39*  --   CREATININE 3.06* 3.99* 3.15* 2.70* 3.62*  --   CALCIUM 9.4 9.5 8.9 8.8* 9.3  --   MG  --  2.2  --   --   --   --   PHOS  --  5.0* 2.9  --   --  5.1*   GFR: Estimated Creatinine Clearance: 23.4 mL/min (A) (by C-G formula based on SCr of 3.62 mg/dL (H)).  Liver Function Tests:  Recent Labs Lab 05/21/2016 1745 06/09/16 0258 06/10/16 1300 06/28/2016 0230 06/12/16 0236  AST 28 17  --  18 48*  ALT 30 25  --  22 36  ALKPHOS 113 101  --  116 167*  BILITOT 1.0 0.8  --  1.9* 1.2  PROT 8.5* 8.0  --  7.9 8.3*  ALBUMIN 2.1* 1.8* 1.7* 1.7* 1.8*    Recent Labs Lab 05/12/2016 1828  AMMONIA 31    HbA1C: Hgb A1c MFr Bld  Date/Time Value Ref Range Status  06/09/2016 02:58 AM 6.7 (H) 4.8 - 5.6 % Final    Comment:    (NOTE)         Pre-diabetes: 5.7 - 6.4         Diabetes: >6.4  Glycemic control for adults with diabetes: <7.0   06/08/2016 04:48 AM 6.6 (H) 4.8 - 5.6 % Final    Comment:    (NOTE)         Pre-diabetes: 5.7 - 6.4         Diabetes: >6.4         Glycemic control for adults with diabetes: <7.0     CBG:  Recent Labs Lab 07/05/2016 0833 06/26/2016 1105 07/09/2016 1349 06/12/2016 1512 06/12/16 1623  GLUCAP 182* 183* 178* 185* 294*    Scheduled Meds: . aspirin  300 mg Rectal Daily  . chlorhexidine  15 mL Mouth Rinse BID  . feeding supplement (NEPRO CARB STEADY)  1,000 mL Oral Q24H  . feeding supplement (PRO-STAT SUGAR FREE 64)  30 mL Per Tube TID  . insulin aspart  0-9 Units Subcutaneous Q4H  . mouth rinse  15 mL Mouth Rinse q12n4p  . metoprolol  5 mg Intravenous Q8H  . pneumococcal 23 valent vaccine  0.5 mL Intramuscular Tomorrow-1000  . sodium hypochlorite   Irrigation BID     LOS: 4 days    Stephania Fragmin, MD [facility] Office  [phone] Pager - Text Page per Loretha Stapler as per below:  On-Call/Text Page:      [email or web address]      password [email or web address]  If 7PM-7AM, please contact night-coverage [email or web address] Password [facility] 06/12/2016, 4:27 PM

## 2016-06-12 NOTE — Progress Notes (Signed)
SLP Cancellation Note  Patient Details Name: [name] MRN: [id number] DOB: 1975/03/29   Cancelled treatment:       Reason Eval/Treat Not Completed: Patient at procedure or test/unavailable   Amari Zagal, Riley Nearing 06/12/2016, 9:51 AM

## 2016-06-12 NOTE — Anesthesia Postprocedure Evaluation (Addendum)
Anesthesia Post Note  Patient: [name]  Procedure(s) Performed: Procedure(s) (LRB): DEBRIDEMENT OF LEFT THIGH ABSCESS (Left)  Patient location during evaluation: PACU Anesthesia Type: General Level of consciousness: awake and patient cooperative Pain management: pain level controlled Vital Signs Assessment: post-procedure vital signs reviewed and stable Respiratory status: spontaneous breathing, nonlabored ventilation, respiratory function stable and patient connected to nasal cannula oxygen Cardiovascular status: stable Postop Assessment: no signs of nausea or vomiting Anesthetic complications: no       Last Vitals:  Vitals:   06/12/16 1000 06/12/16 1030  BP: (!) 129/46 130/80  Pulse: (!) 109 (!) 107  Resp: 14 16  Temp:      Last Pain:  Vitals:   06/12/16 0730  TempSrc: Axillary  PainSc:                  Findlay Dagher

## 2016-06-13 ENCOUNTER — Inpatient Hospital Stay (HOSPITAL_COMMUNITY): Payer: Medicaid Other

## 2016-06-13 DIAGNOSIS — R197 Diarrhea, unspecified: Secondary | ICD-10-CM

## 2016-06-13 DIAGNOSIS — Z978 Presence of other specified devices: Secondary | ICD-10-CM

## 2016-06-13 LAB — GLUCOSE, CAPILLARY
GLUCOSE-CAPILLARY: 315 mg/dL — AB (ref 65–99)
GLUCOSE-CAPILLARY: 353 mg/dL — AB (ref 65–99)
GLUCOSE-CAPILLARY: 311 mg/dL — AB (ref 65–99)
Glucose-Capillary: 340 mg/dL — ABNORMAL HIGH (ref 65–99)

## 2016-06-13 LAB — HEPATITIS B SURFACE ANTIGEN: HEP B S AG: NEGATIVE

## 2016-06-13 MED ORDER — ZINC OXIDE 12.8 % EX OINT
TOPICAL_OINTMENT | Freq: Two times a day (BID) | CUTANEOUS | Status: DC
  Administered 2016-06-13: 1 via TOPICAL
  Administered 2016-06-13 – 2016-06-14 (×3): via TOPICAL
  Administered 2016-06-15: 1 via TOPICAL
  Administered 2016-06-15 – 2016-06-18 (×5): via TOPICAL
  Administered 2016-06-18: 1 via TOPICAL
  Administered 2016-06-18 – 2016-06-24 (×12): via TOPICAL
  Administered 2016-06-24: 1 via TOPICAL
  Administered 2016-06-25 – 2016-06-30 (×9): via TOPICAL
  Administered 2016-07-01: 1 via TOPICAL
  Administered 2016-07-02 – 2016-07-03 (×3): via TOPICAL
  Administered 2016-07-03: 1 via TOPICAL
  Administered 2016-07-04 – 2016-07-08 (×9): via TOPICAL
  Administered 2016-07-09: 1 via TOPICAL
  Administered 2016-07-09: 10:00:00 via TOPICAL
  Administered 2016-07-10: 1 via TOPICAL
  Administered 2016-07-10 – 2016-07-13 (×5): via TOPICAL
  Administered 2016-07-13 – 2016-07-14 (×3): 1 via TOPICAL
  Administered 2016-07-15: 09:00:00 via TOPICAL
  Administered 2016-07-15: 1 via TOPICAL
  Filled 2016-06-13 (×5): qty 56.7

## 2016-06-13 NOTE — Progress Notes (Signed)
[facility] Progress Note   Subjective: opens eyes today, nonverbal still.  USed AV graft with HD yesterday.   Vitals:   06/12/16 2311 06/13/16 0254 06/13/16 0732 06/13/16 1209  BP: (!) 119/57 110/65 (!) 113/55 118/77  Pulse: 97 97 99 (!) 103  Resp:  20 18 13   Temp: 98.5 F (36.9 C) 98.5 F (36.9 C) 98.2 F (36.8 C) 98.3 F (36.8 C)  TempSrc: Axillary Axillary Axillary Axillary  SpO2:  100% 100% 100%  Weight:      Height:        Inpatient medications: . aspirin  300 mg Rectal Daily  . chlorhexidine  15 mL Mouth Rinse BID  . feeding supplement (NEPRO CARB STEADY)  1,000 mL Oral Q24H  . feeding supplement (PRO-STAT SUGAR FREE 64)  30 mL Per Tube TID  . insulin aspart  0-9 Units Subcutaneous Q4H  . mouth rinse  15 mL Mouth Rinse q12n4p  . metoprolol  5 mg Intravenous Q8H  . pneumococcal 23 valent vaccine  0.5 mL Intramuscular Tomorrow-1000  . Zinc Oxide   Topical BID   . ceFEPime (MAXIPIME) IV Stopped (06/12/16 1941)  . vancomycin Stopped (06/12/16 1146)   [DISCONTINUED] acetaminophen **OR** [DISCONTINUED] acetaminophen (TYLENOL) oral liquid 160 mg/5 mL **OR** acetaminophen  Exam: Not responding, obese, temporal musc wasting No jvd Chest clear bilat RRR tachy no RG ABd obese soft nondistended Ext - wound VAC L thigh, some dark blistering superior to wound, no edema; RLE no edema LUA AVG+bruit R IJ cath Neuro- is not responding to voice/ stimulation  Dialysis:  [facility]  4:30 hours, 400/800  105.5kg   3K/2Ca   TDC R IJ/ maturing LUA AVG  Hep 4400 - Mircera IV q 2 weeks (last given 4/25) - Calcitriol 0.70mcg PO q HD  Assessment: 1. Acute multifocal CVAs: Plan per neuro and intensivist 2. AMS: poorly responsive, CVA +/- infection 3. L thigh soft-tissue infection: sp I&D 4/10 and repeat I&D 5/2 yesterday by gen surg. VAC in place, IV abx vanc/ zosyn.  4. Abnormal ECHO: Possible vegetation on L coronary cusp. TEE planned. 1/6 +bcx's for CNSS  was felt to be contaminant most likely per ID.   5. ESRD: MWF HD. Using L arm AVG now, will get cath out 6. HTN: home coreg on hold, getting q 8hr IV metoprolol for now 7. Volume: under dry wt 2kg, no excess on exam 8. Anemia: Hgb 8.1, just got mircera 150 ug on 4/25. Transfuse prn. Last tsat 4/19 not calc due to low transferrin. Ferritin 450 > 1000> 3100 over last 3 mos, poss d/t leg infection.   9. Metabolic bone disease: CorrCa high, Phos ok. Holding VDRA/ binders for now 10. Nutrition: Alb very low, Cortrak is in now for TF   Plan - HD Friday using AVG, consulting IR for removal [facility]   Linda Moselle MD [facility] pager [phone]   06/13/2016, 1:49 PM    Recent Labs Lab 06/09/16 0258 06/10/16 1300 06/28/2016 0230 06/12/16 0236 06/12/16 0532  NA 137 139 137 137  --   K 3.8 3.2* 4.5 5.4*  --   CL 97* 100* 98* 97*  --   CO2 25 26 23 24   --   GLUCOSE 157* 123* 140* 272*  --   BUN 37* 30* 21* 39*  --   CREATININE 3.99* 3.15* 2.70* 3.62*  --   CALCIUM 9.5 8.9 8.8* 9.3  --   PHOS 5.0* 2.9  --   --  5.1*    Recent Labs Lab 06/09/16 0258 06/10/16 1300 2016-06-05 0230 06/12/16 0236  AST 17  --  18 48*  ALT 25  --  22 36  ALKPHOS 101  --  116 167*  BILITOT 0.8  --  1.9* 1.2  PROT 8.0  --  7.9 8.3*  ALBUMIN 1.8* 1.7* 1.7* 1.8*    Recent Labs Lab 06/02/2016 1745  06/08/16 0448  06/10/16 1349 2016-06-05 0230 06/12/16 0236  WBC 20.0*  --  18.1*  < > 17.7* 18.0* 12.4*  NEUTROABS 16.4*  --  14.4*  --   --   --   --   HGB 9.1*  < > 8.9*  < > 7.9* 7.9* 9.0*  HCT 30.3*  < > 29.6*  < > 26.2* 26.7* 30.2*  MCV 91.0  --  90.2  < > 89.7 91.1 91.0  PLT 406*  --  419*  < > 323 280 PLATELET CLUMPS NOTED ON SMEAR, COUNT APPEARS ADEQUATE  < > = values in this interval not displayed. Iron/TIBC/Ferritin/ %Sat    Component Value Date/Time   IRON 32 05/30/2016 1002   TIBC NOT CALCULATED 05/30/2016 1002   FERRITIN 3,194 (H) 05/30/2016 1002   IRONPCTSAT NOT CALCULATED  05/30/2016 1002

## 2016-06-13 NOTE — Progress Notes (Signed)
CC:  AMS/sepsis  2 Days Post-Op  Subjective: Open wound looks clean.  Picture below.  She is not alert and not responding.    Objective: Vital signs in last 24 hours: Temp:  [98.2 F (36.8 C)-98.6 F (37 C)] 98.2 F (36.8 C) (05/03 0732) Pulse Rate:  [97-109] 99 (05/03 0732) Resp:  [11-24] 18 (05/03 0732) BP: (99-132)/(27-86) 113/55 (05/03 0732) SpO2:  [100 %] 100 % (05/03 0732) Last BM Date: 06/12/16 NG TF 666.7 IV 200 No urine recorded No drain recorded NO BM recorded. Afebrile, VSS NO labs Intake/Output from previous day: 05/02 0701 - 05/03 0700 In: 866.7 [NG/GT:666.7; IV Piggyback:200] Out: 0  Intake/Output this shift: No intake/output data recorded.  General appearance: not responding, TF in place but currently off.   Skin: see picture      Lab Results:   Recent Labs  Jun 28, 2016 0230 06/12/16 0236  WBC 18.0* 12.4*  HGB 7.9* 9.0*  HCT 26.7* 30.2*  PLT 280 PLATELET CLUMPS NOTED ON SMEAR, COUNT APPEARS ADEQUATE    BMET  Recent Labs  06-28-16 0230 06/12/16 0236  NA 137 137  K 4.5 5.4*  CL 98* 97*  CO2 23 24  GLUCOSE 140* 272*  BUN 21* 39*  CREATININE 2.70* 3.62*  CALCIUM 8.8* 9.3   PT/INR No results for input(s): LABPROT, INR in the last 72 hours.   Recent Labs Lab 05/15/2016 1745 06/09/16 0258 06/10/16 1300 2016-06-28 0230 06/12/16 0236  AST 28 17  --  18 48*  ALT 30 25  --  22 36  ALKPHOS 113 101  --  116 167*  BILITOT 1.0 0.8  --  1.9* 1.2  PROT 8.5* 8.0  --  7.9 8.3*  ALBUMIN 2.1* 1.8* 1.7* 1.7* 1.8*     Lipase  No results found for: LIPASE   Studies/Results: No results found.  Medications: . aspirin  300 mg Rectal Daily  . chlorhexidine  15 mL Mouth Rinse BID  . feeding supplement (NEPRO CARB STEADY)  1,000 mL Oral Q24H  . feeding supplement (PRO-STAT SUGAR FREE 64)  30 mL Per Tube TID  . insulin aspart  0-9 Units Subcutaneous Q4H  . mouth rinse  15 mL Mouth Rinse q12n4p  . metoprolol  5 mg Intravenous Q8H  . pneumococcal  23 valent vaccine  0.5 mL Intramuscular Tomorrow-1000  . sodium hypochlorite   Irrigation BID   . ceFEPime (MAXIPIME) IV Stopped (06/12/16 1941)  . vancomycin Stopped (06/12/16 1146)   Anti-infectives    Start     Dose/Rate Route Frequency Ordered Stop   06/12/16 1700  ceFEPIme (MAXIPIME) 1 g in dextrose 5 % 50 mL IVPB     1 g 100 mL/hr over 30 Minutes Intravenous Every 24 hours 06/12/16 1648     06/12/16 0959  vancomycin (VANCOCIN) 1-5 GM/200ML-% IVPB    Comments:  Carlyon Prows   : cabinet override      06/12/16 0959 06/12/16 1015   06/10/16 1200  vancomycin (VANCOCIN) IVPB 1000 mg/200 mL premix     1,000 mg 200 mL/hr over 60 Minutes Intravenous Every M-W-F (Hemodialysis) 06/10/16 0851     06/10/16 1000  piperacillin-tazobactam (ZOSYN) IVPB 3.375 g  Status:  Discontinued     3.375 g 12.5 mL/hr over 240 Minutes Intravenous Every 12 hours 06/10/16 0851 06/12/16 1629   06/10/16 1000  vancomycin (VANCOCIN) 2,250 mg in sodium chloride 0.9 % 500 mL IVPB  Status:  Discontinued     2,250 mg 250 mL/hr over  120 Minutes Intravenous Every 12 hours 06/10/16 0851 06/10/16 0858   06/10/16 1000  vancomycin (VANCOCIN) 2,250 mg in sodium chloride 0.9 % 500 mL IVPB     2,250 mg 250 mL/hr over 120 Minutes Intravenous  Once 06/10/16 0858 06/10/16 1640      Assessment/Plan Left thigh soft tissue infection s/p Excision of skin and subcutaneous tissue of left thigh (25 x 8 x 3 cm), 06/21/2016, Manus RuddMatthew Tsuei Acute subcentimeter RIGHT deep gray nuclei and RIGHT hippocampal infarcts Chronically occluded RIGHT internal carotid artery, and LEFT vertebral artery Hypertension ESRD  HD -  MWF Hypertension Anemia FEN: No IV fluids or diet ordered - Cortrak day 2 ID:  Zosyn/Vancomycin 06/10/16 =>> day 3 converted to Cefepime 5/2 =>> day 2 DVT:  Heparin   Plan:  Continue wound vac Rx.  Will switch to MWF.     LOS: 5 days    Regina Ganci 06/13/2016 [phone]

## 2016-06-13 NOTE — Progress Notes (Signed)
Nutrition Follow-up  DOCUMENTATION CODES:   Morbid obesity  INTERVENTION:   Continue:   Nepro at 40 ml/h with Pro-stat 30 ml TID to provide 2028 kcal, 123 gm protein, 698 ml free water daily.  NUTRITION DIAGNOSIS:   Inadequate oral intake related to lethargy/confusion as evidenced by NPO status.  Ongoing  GOAL:   Patient will meet greater than or equal to 90% of their needs   Met with TF  MONITOR:   Diet advancement, PO intake, Weight trends, I & O's, TF tolerance  ASSESSMENT:   41 y.o.woman (SNF resident) with PMH of CVA, DM2, morbid obesity, chronic lymphedema, HTN, chronic diastolic heart failure, depression, ESRD on HD, and infected leg ulcer. Admitted on 4/27 after her HD session with mental status changes.    SLP unable to complete swallow evaluation all week due to patient's fatigue and lethargy.  Cortrak tube placed 4/30 (tip in the gastric pylorus or proximal duodenum). Patient is currently receiving Nepro via Cortrak tube at 40 ml/h (960 ml/day) with Prostat 30 ml TID to provide 2028 kcals, 123 gm protein, 698 ml free water daily. Tolerating TF well. WOC RN following for L thigh abscess.  S/P excision of skin and subcutaneous tissue of L thigh for soft tissue infection on 5/1. Labs reviewed 5/2: phosphorus 5.1 (H), potassium 5.4 (H) CBG's: 294-340 Medications reviewed. Nutrition-Focused physical exam completed. Findings are no fat depletion, no muscle depletion, and mild edema.   Diet Order:  Diet NPO time specified  Skin:  L thigh abscess; wounds to R leg, sacrum, stg 2 buttocks; DTI R heel; unstageable L heel  Last BM:  5/2  Height:   Ht Readings from Last 1 Encounters:  06/09/16 _0  (1.575 m)    Weight:   Wt Readings from Last 1 Encounters:  06/09/16 226 lb 6.6 oz (102.7 kg)    Ideal Body Weight:  50 kg  BMI:  Body mass index is 41.41 kg/m.  Estimated Nutritional Needs:   Kcal:  1900-2100  Protein:  110-130 gm  Fluid:  >/= 2.1  L  EDUCATION NEEDS:   No education needs identified at this time  Molli Barrows, [place], [place], [place] Pager [phone] After Hours Pager [phone]

## 2016-06-13 NOTE — Progress Notes (Signed)
[place] TEAM 1 - Stepdown/ICU TEAM  Linda DykesDavenia McKinnon [name]  WUJ:[id number] DOB: 05/03/1975 DOA: Sep 30, 2016 PCP: Linda LorBarr, Julie, NP    Brief Narrative:  41 y.o.woman with a history of CVA, DM2, morbid obesity, chronic lymphedema, HTN, chronic diastolic heart failure, depression, and admission 3/21 > 4/20 for acute encephalopathy, acute renal failure on CKD 5 requiring HD, decompensated heart failure, and infected leg ulcer.  She was discharged to a SNF.  She had been dialyzing, presumably on MWF.  Upon completion of her HD session on the day of presentation, she was noted to have mental status changes.  It was not clear to the personnel when she was last known normal.  She was transferred to the ED where she had a WBC count of 20, Hgb 9, Ammonia level normal.  Head CT was negative for acute process.  MRI/MRA noted acute right deep gray nuclei and right hippocampal infarcts with an occluded R MCA, Right ICA, and Left vertebral artery.  Subjective: Pt remains essentially obtunded.  Doesn't communicate. No acute events overnight. I thought she was attempted to squeeze my hand when I asked her to.   Assessment & Plan:  Acute metabolic encephalopathy in setting of chronic encephalopathy During last admit mental status apparently waxed and waned - Psych saw in consultation and suggested tx for depression  - suspect her acute change is due to TME in setting of sepsis related to her L thigh wound and possible SBE -Cont vanc and Cefepime now.  - cont supportive care   L posterior/medial thigh wound s/p debridement by [facility] 05/21/16 - completed a course of abx tx on 05/31/16  - CT of the thigh has noted a new area c/w an abscess not communicating with the currently opened area  - Gen Surgery- Excision of skin and subcutaneous tissue of left thigh (25 x 8 x 3 cm) on 5/1 - broad abx coverage to include MRSA coverage to continue   SBE - Possible 7x226mm vegetation L coronary cusp  Blood cx during last  admit 1 of 6 + for coag neg Staph strongly suggestive of contaminant - no such mass noted on TTE 03/22/16  -Cardiology consulted for TEE. Pending eval. Called Endo and said patient is scheduled tomorrow for 8am. I will make her NPO PMN.   Acute, multifocal CVAs with history of multiple prior CVAs Concerns for cardiac lesion as seen on echocardiogram which could likely be the source of her stroke She will need TEE - schedule for tomorrow morning at 8am   ESRD on HD Nephrology following - for HD M/W/F   Reactive RPR Quant f/u RPR 1:1 w/ negative T pallidum abs - ?false negative - ID following   Morbid obesity - Body mass index is 41.41 kg/m.   Nutrition  Cortrak for nutrition. Consulted Palliative team to help with the discussion for peg placement.   HTN BP well controlled/stable   DM CBG reasonably well controlled - cont to follow Q4hr  Chronic diastolic congestive heart failure TTE 4/28 noted EF 55-60% w/ grade 1 DD - EDW per HD center is 105.5kg  Filed Weights   06/08/16 0421 06/09/16 0500  Weight: 105.3 kg (232 lb 2.3 oz) 102.7 kg (226 lb 6.6 oz)    DVT prophylaxis: SCDs Code Status: FULL CODE Family Communication: no family present at time of exam. Spoke with her sister over the phone again today and explained her why we have consulted palliative team and possible need for Peg tube eventually.  Disposition Plan: SDU  Consultants:  Neurology  Gen Surgery Nephrology   Procedures: none  Antimicrobials:  Zosyn 4/30 >5/2 Vanc 4/30 > Cefepime 5/2 >  Objective: Blood pressure (!) 113/55, pulse 99, temperature 98.2 F (36.8 C), temperature source Axillary, resp. rate 18, height 5\' 2"  (1.575 m), weight 102.7 kg (226 lb 6.6 oz), SpO2 100 %.  Intake/Output Summary (Last 24 hours) at 06/13/16 1142 Last data filed at 06/13/16 0400  Gross per 24 hour  Intake           806.67 ml  Output                0 ml  Net           806.67 ml   Filed Weights   06/08/16 0421  06/09/16 0500  Weight: 105.3 kg (232 lb 2.3 oz) 102.7 kg (226 lb 6.6 oz)    Examination: General: No acute respiratory distress - obtunded  Lungs: Clear to auscultation bilaterally without wheeze - distant BS related to body habitus  Cardiovascular: Regular rate and rhythm without murmur - distant HS  Abdomen: Obese, soft, bowel sounds present, no organomegaly, no rebound - Cortrak in L nare Extremities: L medial thigh wound dressed and presently dry - change of chronic venous statis dermatitis noted B Neurologic: Obtunded, no Babinski - does not withdraw from painful stimuli - opens eyes intermittently but does not follow examiner    CBC:  Recent Labs Lab 06/29/2016 1745  06/08/16 0448 06/09/16 0258 06/10/16 1349 06/24/2016 0230 06/12/16 0236  WBC 20.0*  --  18.1* 18.4* 17.7* 18.0* 12.4*  NEUTROABS 16.4*  --  14.4*  --   --   --   --   HGB 9.1*  < > 8.9* 8.1* 7.9* 7.9* 9.0*  HCT 30.3*  < > 29.6* 26.4* 26.2* 26.7* 30.2*  MCV 91.0  --  90.2 89.2 89.7 91.1 91.0  PLT 406*  --  419* 357 323 280 PLATELET CLUMPS NOTED ON SMEAR, COUNT APPEARS ADEQUATE  < > = values in this interval not displayed. Basic Metabolic Panel:  Recent Labs Lab 06/08/16 0448 06/09/16 0258 06/10/16 1300 07/09/2016 0230 06/12/16 0236 06/12/16 0532  NA 136 137 139 137 137  --   K 4.1 3.8 3.2* 4.5 5.4*  --   CL 96* 97* 100* 98* 97*  --   CO2 23 25 26 23 24   --   GLUCOSE 176* 157* 123* 140* 272*  --   BUN 26* 37* 30* 21* 39*  --   CREATININE 3.06* 3.99* 3.15* 2.70* 3.62*  --   CALCIUM 9.4 9.5 8.9 8.8* 9.3  --   MG  --  2.2  --   --   --   --   PHOS  --  5.0* 2.9  --   --  5.1*   GFR: Estimated Creatinine Clearance: 23.4 mL/min (A) (by C-G formula based on SCr of 3.62 mg/dL (H)).  Liver Function Tests:  Recent Labs Lab 06/29/2016 1745 06/09/16 0258 06/10/16 1300 06/16/2016 0230 06/12/16 0236  AST 28 17  --  18 48*  ALT 30 25  --  22 36  ALKPHOS 113 101  --  116 167*  BILITOT 1.0 0.8  --  1.9* 1.2    PROT 8.5* 8.0  --  7.9 8.3*  ALBUMIN 2.1* 1.8* 1.7* 1.7* 1.8*    Recent Labs Lab 06-29-2016 1828  AMMONIA 31    HbA1C: Hgb A1c MFr Bld  Date/Time Value  Ref Range Status  06/09/2016 02:58 AM 6.7 (H) 4.8 - 5.6 % Final    Comment:    (NOTE)         Pre-diabetes: 5.7 - 6.4         Diabetes: >6.4         Glycemic control for adults with diabetes: <7.0   06/08/2016 04:48 AM 6.6 (H) 4.8 - 5.6 % Final    Comment:    (NOTE)         Pre-diabetes: 5.7 - 6.4         Diabetes: >6.4         Glycemic control for adults with diabetes: <7.0     CBG:  Recent Labs Lab 06/13/2016 0833 07/03/2016 1105 06/29/2016 1349 06/25/2016 1512 06/12/16 1623  GLUCAP 182* 183* 178* 185* 294*    Scheduled Meds: . aspirin  300 mg Rectal Daily  . chlorhexidine  15 mL Mouth Rinse BID  . feeding supplement (NEPRO CARB STEADY)  1,000 mL Oral Q24H  . feeding supplement (PRO-STAT SUGAR FREE 64)  30 mL Per Tube TID  . insulin aspart  0-9 Units Subcutaneous Q4H  . mouth rinse  15 mL Mouth Rinse q12n4p  . metoprolol  5 mg Intravenous Q8H  . pneumococcal 23 valent vaccine  0.5 mL Intramuscular Tomorrow-1000  . Zinc Oxide   Topical BID     LOS: 5 days    Stephania Fragmin, MD [facility] Office  [phone] Pager - Text Page per Loretha Stapler as per below:  On-Call/Text Page:      [email or web address]      password [email or web address]  If 7PM-7AM, please contact night-coverage [email or web address] Password [email or web address] 06/13/2016, 11:42 AM

## 2016-06-13 NOTE — Consult Note (Signed)
Consultation Note Date: 06/13/2016   Patient Name: [name]  DOB: 11-Jan-1976  MRN: [id number]  Age / Sex: 41 y.o., female  PCP: Alvester Chou, NP Referring Physician: Damita Lack, MD  Reason for Consultation: Establishing goals of care and Psychosocial/spiritual support  HPI/Patient Profile: 41 y.o. female  with past medical history of CKD stage V requiring HD, CVA with residual right sided weakness, dCHF, morbid obesity, HTN, chronic lymphedema, depression, and chronic leg wounds. This is her seventh admission since November. Her last hospitalization was 3/21-4/20 for acute encephalopathy, acute renal failure on CKD 5 requiring HD, decompensated heart failure, and infected leg ulcer. She most recently presented to the ED after she was noted to be confused at the completion of her dialysis session. In the ED she was found to have an elevated WBC and Brain MRA revealed an acute right deep gray nuclei and right hippocampal infarcts with an occluded R MCA, Right ICA, and Left vertebral artery. Also concern for leg wound infections, with CT scan concerning for fluid collection under previously drained abscess, now s/p debridement. Furthermore, concern for IE, given ECHO results noting an echodensity on the left coronary cusp. TEE pending. Pt remains minimally interactive with no PO intake. Palliative consulted to assist in goals of care, specifically discussing PEG placement.   Clinical Assessment and Goals of Care: [name] is unable to participate in conversations about her care due to encephalopathy. I have previously met her and spoken with her husband on a prior admission, however he no longer has a cell phone or a means of contact. Her sister's contact information is in the chart, and I was able to call and speak with her and Sameka's mother over the phone.   When I spoke with Altha Harm, [name]'s sister, over the phone, she had a good  understanding of the acute health issues her sister was struggling with. She understood she had a stroke, the infection from her thigh wound had likely returned, and that cardiology was evaluating her heart for a possible infection growing on it. That said, she was not verbalizing an understanding of the larger picture. She has spoken with the primary team and had the impression that her sister was improving and "much better." I clarified that some things were improving, however her overall condition was much worse compared to prior to admission. I gently touched on the necessity to start thinking about next steps, considering [name] was in a different place than she had been during the last admission. I repeated this conversation with [name]'s mother as well. They agreed to come to the hospital 5/4 at noon to have a family meeting. I asked they contact Brittnei's husband if they are able, as he is legally her decision maker.   Primary Decision Maker NEXT OF KIN. Pt has a legal husband, however no contact information is available for him. She also has a sister and a mother.   SUMMARY OF RECOMMENDATIONS    Full code, continue full scope treatment  Plan for family meeting with pt's sister and mother 5/4 at noon. I asked them to contact her husband if they are able, as he needs to be present at this meeting. He does not have a cell phone but they do see him intermittently and said they would relay the meeting time.  Code Status/Advance Care Planning:  Full code  Palliative Prophylaxis:   Aspiration, Frequent Pain Assessment, Oral Care, Palliative Wound Care and Turn Reposition  Additional Recommendations (Limitations, Scope, Preferences):  Full Scope Treatment  Psycho-social/Spiritual:   Desire for further Chaplaincy support:no  Additional Recommendations: TBD  Prognosis:   Unable to determine.  Discharge Planning: To Be Determined      Primary Diagnoses: Present on Admission: .  CVA (cerebral vascular accident) ([place]) . Morbid obesity ([place]) . Essential hypertension . Acute metabolic encephalopathy . ESRD (end stage renal disease) ([place]) . Depression due to physical illness   I have reviewed the medical record, interviewed the patient and family, and examined the patient. The following aspects are pertinent.  Past Medical History:  Diagnosis Date  . Anasarca 02/05/2015  . Asthma   . CHF (congestive heart failure) ([facility])    diastolic  . CVA (cerebral infarction) 1990's   "writing is not the same since" R leg weakness   . GERD (gastroesophageal reflux disease)   . Hypertension   . Morbid obesity ([place])   . Palliative care encounter   . Stroke ([place])   . Type II diabetes mellitus ([place])    Social History   Social History  . Marital status: Married    Spouse name: N/A  . Number of children: N/A  . Years of education: N/A   Social History Main Topics  . Smoking status: Never Smoker  . Smokeless tobacco: Never Used  . Alcohol use No  . Drug use: No  . Sexual activity: Not Asked   Other Topics Concern  . None   Social History Narrative  . None   Family History  Problem Relation Age of Onset  . Diabetes Mother   . Kidney disease Mother    Scheduled Meds: . aspirin  300 mg Rectal Daily  . chlorhexidine  15 mL Mouth Rinse BID  . feeding supplement (NEPRO CARB STEADY)  1,000 mL Oral Q24H  . feeding supplement (PRO-STAT SUGAR FREE 64)  30 mL Per Tube TID  . insulin aspart  0-9 Units Subcutaneous Q4H  . mouth rinse  15 mL Mouth Rinse q12n4p  . metoprolol  5 mg Intravenous Q8H  . pneumococcal 23 valent vaccine  0.5 mL Intramuscular Tomorrow-1000  . Zinc Oxide   Topical BID   Continuous Infusions: . ceFEPime (MAXIPIME) IV Stopped (06/12/16 1941)  . vancomycin Stopped (06/12/16 1146)   PRN Meds:.[DISCONTINUED] acetaminophen **OR** [DISCONTINUED] acetaminophen (TYLENOL) oral liquid 160 mg/5 mL **OR** acetaminophen Allergies  Allergen Reactions  .  Azithromycin Other (See Comments)    Nose bleeding event   Review of Systems  -Unable to obtain given pt's encephalopathy.   Physical Exam  Constitutional: Vital signs are normal. She appears lethargic. She has a sickly appearance.  Pt appears much thinner compared to when I last saw her.   HENT:  Head: Normocephalic and atraumatic.  Mouth/Throat: Oropharynx is clear and moist. No oropharyngeal exudate.  Cortrak with feedings in nare  Eyes: EOM are normal.  Neck: Normal range of motion. Neck supple.  Cardiovascular: Regular rhythm.  Tachycardia present.   Pulmonary/Chest: Effort normal and breath sounds normal.  Abdominal: Soft. Bowel sounds are decreased.  Musculoskeletal:  Not following commands. Spontaneously moving fingers bilaterally.  Neurological: She appears lethargic.  Opens her eyes to voice and will briefly track me. Not following commands or verbally responding.   Skin: Skin is warm and dry.  Wound vac on left inner thigh  Psychiatric:  Calm in bed, but not interactive.   Vital Signs: BP 118/77 (BP Location: Right Arm)   Pulse (!) 103   Temp 98.3 F (36.8 C) (Axillary)   Resp  13   Ht _0  (1.575 m)   Wt 102.7 kg (226 lb 6.6 oz)   SpO2 100%   BMI 41.41 kg/m  Pain Assessment: CPOT POSS *See Group Information*: 1-Acceptable,Awake and alert Pain Score: 0-No pain   SpO2: SpO2: 100 % O2 Device:SpO2: 100 % O2 Flow Rate: .O2 Flow Rate (L/min): 2 L/min  IO: Intake/output summary:  Intake/Output Summary (Last 24 hours) at 06/13/16 1419 Last data filed at 06/13/16 1209  Gross per 24 hour  Intake           992.67 ml  Output                0 ml  Net           992.67 ml    LBM: Last BM Date: 06/12/16 Baseline Weight: Weight: 105.3 kg (232 lb 2.3 oz) Most recent weight: Weight: 102.7 kg (226 lb 6.6 oz)     Palliative Assessment/Data: PPS 10%    Time Total: 70 minutes Greater than 50%  of this time was spent counseling and coordinating care related to the  above assessment and plan.  Signed by: Charlynn Court, NP Palliative Medicine Team Pager # [phone] (M-F 7a-5p) Team Phone # [phone] (Nights/Weekends)

## 2016-06-13 NOTE — Progress Notes (Signed)
Physical Therapy Treatment Patient Details Name: [name] MRN: [id number] DOB: 05/03/1975 Today's Date: 06/13/2016    History of Present Illness Patient is a 41 yo female admitted 05/12/2016 with AMS from D[facility] Center.  Patient with Rt infarcts, occluded MCA, Rt ICA, and Lt vertebral artery. No verbalizations or voluntary movement.   PMH:  CVA with RLE weak, DM, obesity, chronic lymphedema, wound Rt calf, VAC on Lt thigh, HTN, CHF, depression, ESRD on HD, asthma    PT Comments    Patient demonstrated purposeful movement of R UE when given verbal and tactile cues. Pt also with more eye opening this session. Pt able to tolerate 10 mins sitting EOB and VSS. Pt on RA throughout session. Pt will continue to benefit from skilled PT in acute and post acute setting.    Follow Up Recommendations  SNF;Supervision/Assistance - 24 hour     Equipment Recommendations  Hospital bed    Recommendations for Other Services Rehab consult;OT consult     Precautions / Restrictions Precautions Precautions: Fall Precaution Comments: bil inner thigh wounds that were surgically debrided in OR--now wound vac Restrictions Weight Bearing Restrictions: No    Mobility  Bed Mobility Overal bed mobility: Needs Assistance Bed Mobility: Supine to Sit;Sit to Supine     Supine to sit: Total assist;+2 for physical assistance Sit to supine: Total assist;+2 for physical assistance   General bed mobility comments: assist for all aspects of bed mobility; pt raises L LE when movement initiated but may just be provoked by pain due to L thigh wound  Transfers                    Ambulation/Gait                 Stairs            Wheelchair Mobility    Modified Rankin (Stroke Patients Only)       Balance Overall balance assessment: Needs assistance Sitting-balance support: No upper extremity supported;Feet supported Sitting balance-Leahy Scale: Zero Sitting balance -  Comments: We had pt sit EOB ~10 minutes EOB trying to get her to work on head/trunk control, eye movements, and moving arms/legs. No attempt at anything except she did move her LUE on command with increased time and moved LLE/RLE when feet were touched ("tickled')                                    Cognition Arousal/Alertness: Lethargic Behavior During Therapy: Flat affect Overall Cognitive Status: Impaired/Different from baseline Area of Impairment: Following commands                       Following Commands: Follows one step commands with increased time       General Comments: purposeful movement of R UE this session with verbal and tactile cues to move arms and pt opened eyes much more this session than previous PT session      Exercises      General Comments General comments (skin integrity, edema, etc.): pt on RA throughout session and VSS      Pertinent Vitals/Pain Pain Location: withdrew to pain (nail bed pressure) on left foot, right foot, and generalized movements to nail bed pressure on LUE Pain Intervention(s): Monitored during session    Home Living  Prior Function            PT Goals (current goals can now be found in the care plan section) Acute Rehab PT Goals Patient Stated Goal: Unable to state Progress towards PT goals: Progressing toward goals    Frequency    Min 2X/week      PT Plan Current plan remains appropriate    Co-evaluation PT/OT/SLP Co-Evaluation/Treatment: Yes Reason for Co-Treatment: Complexity of the patient's impairments (multi-system involvement);For patient/therapist safety PT goals addressed during session: Mobility/safety with mobility OT goals addressed during session: Strengthening/ROM      AM-PAC PT "6 Clicks" Daily Activity  Outcome Measure  Difficulty turning over in bed (including adjusting bedclothes, sheets and blankets)?: Total Difficulty moving from lying on  back to sitting on the side of the bed? : Total Difficulty sitting down on and standing up from a chair with arms (e.g., wheelchair, bedside commode, etc,.)?: Total Help needed moving to and from a bed to chair (including a wheelchair)?: Total Help needed walking in hospital room?: Total Help needed climbing 3-5 steps with a railing? : Total 6 Click Score: 6    End of Session   Activity Tolerance: Patient limited by lethargy Patient left: in bed;with bed alarm set;with call bell/phone within reach;with nursing/sitter in room Nurse Communication: Mobility status;Other (comment) (wound vac tube came off during transfer back to supine ) PT Visit Diagnosis: Muscle weakness (generalized) (M62.81);Difficulty in walking, not elsewhere classified (R26.2) Pain - Right/Left: Left Pain - part of body: Leg     Time: 1340-1415 PT Time Calculation (min) (ACUTE ONLY): 35 min  Charges:  $Therapeutic Activity: 8-22 mins                    G Codes:       Erline Levine, PTA Pager: [phone]     Carolynne Edouard 06/13/2016, 3:24 PM

## 2016-06-13 NOTE — Consult Note (Signed)
WOC Nurse wound follow up Wound type: surgical, left inner thigh Measurement: 7.5cm x 15cm x 2cm  Wound bed: subcutaneous tissue, clean Drainage (amount, consistency, odor)serosanguinous in the canister, minimal Periwound: intact, brawny skin changes Dressing procedure/placement/frequency: Window framed the wound with hydrocolloid strips to aid in better seal 1pc of black foam used in the wound bed Seal obtained at 125mmHG. Patient tolerated well. She is minimally responsive will pull leg slightly with pain. Not following commands, gaze but will not track.  Left and right heel wounds unchanged.  Continue prevalon boots. Added triple paste for buttock MASD, [facility] but continues to have some leakage. She is anuric   WOC nurse updated orders for NPWT dressing changes M/W/F. Janille Draughon [facility] MSN,RN,CWOCN, CNS [phone]

## 2016-06-13 NOTE — Progress Notes (Signed)
Inpatient Diabetes Program Recommendations  AACE/ADA: New Consensus Statement on Inpatient Glycemic Control (2015)  Target Ranges:  Prepandial:   less than 140 mg/dL      Peak postprandial:   less than 180 mg/dL (1-2 hours)      Critically ill patients:  140 - 180 mg/dL   Lab Results  Component Value Date   GLUCAP 294 (H) 06/12/2016   HGBA1C 6.7 (H) 06/09/2016   Diabetes history: Type 2 diabetes Outpatient Diabetes medications: Novolog resistant tid with meals, Lantus 14 units q HS Current orders for Inpatient glycemic control:  Novolog sensitive q 4 hours  Inpatient Diabetes Program Recommendations:    Please consider adding Lantus 10 units daily.  May also benefit from Novolog tube feed coverage 2 units q 4 hours to cover CHO in tube feeds. [facility], patient's blood sugars are greater than 300 mg/dL.   Thanks, Beryl MeagerJenny Tahara Ruffini, RN, BC-ADM Inpatient Diabetes Coordinator Pager [phone] (8a-5p)

## 2016-06-13 NOTE — Progress Notes (Signed)
INFECTIOUS DISEASE PROGRESS NOTE  ID: [name] is a 41 y.o. female with  Principal Problem:   CVA (cerebral vascular accident) (HCC) Active Problems:   Morbid obesity (HCC)   Essential hypertension   Acute metabolic encephalopathy   Uncontrolled diabetes mellitus with diabetic nephropathy, with long-term current use of insulin (HCC)   ESRD (end stage renal disease) (HCC)   Depression due to physical illness   Wound abscess  Subjective: Remains non-verbal, however responding to simple commands and makes eye contact.   Abtx:  Anti-infectives    Start     Dose/Rate Route Frequency Ordered Stop   06/12/16 1700  ceFEPIme (MAXIPIME) 1 g in dextrose 5 % 50 mL IVPB     1 g 100 mL/hr over 30 Minutes Intravenous Every 24 hours 06/12/16 1648     06/12/16 0959  vancomycin (VANCOCIN) 1-5 GM/200ML-% IVPB    Comments:  [name], Linda   : cabinet override      06/12/16 0959 06/12/16 1015   06/10/16 1200  vancomycin (VANCOCIN) IVPB 1000 mg/200 mL premix     1,000 mg 200 mL/hr over 60 Minutes Intravenous Every M-W-F (Hemodialysis) 06/10/16 0851     06/10/16 1000  piperacillin-tazobactam (ZOSYN) IVPB 3.375 g  Status:  Discontinued     3.375 g 12.5 mL/hr over 240 Minutes Intravenous Every 12 hours 06/10/16 0851 06/12/16 1629   06/10/16 1000  vancomycin (VANCOCIN) 2,250 mg in sodium chloride 0.9 % 500 mL IVPB  Status:  Discontinued     2,250 mg 250 mL/hr over 120 Minutes Intravenous Every 12 hours 06/10/16 0851 06/10/16 0858   06/10/16 1000  vancomycin (VANCOCIN) 2,250 mg in sodium chloride 0.9 % 500 mL IVPB     2,250 mg 250 mL/hr over 120 Minutes Intravenous  Once 06/10/16 0858 06/10/16 1640      Medications:  I have reviewed the patient's current medications. Scheduled:  . aspirin  300 mg Rectal Daily  . chlorhexidine  15 mL Mouth Rinse BID  . feeding supplement (NEPRO CARB STEADY)  1,000 mL Oral Q24H  . feeding supplement (PRO-STAT SUGAR FREE 64)  30 mL Per Tube TID  .  insulin aspart  0-9 Units Subcutaneous Q4H  . mouth rinse  15 mL Mouth Rinse q12n4p  . metoprolol  5 mg Intravenous Q8H  . pneumococcal 23 valent vaccine  0.5 mL Intramuscular Tomorrow-1000    Objective: Vital signs in last 24 hours: Temp:  [98.2 F (36.8 C)-98.6 F (37 C)] 98.2 F (36.8 C) (05/03 0732) Pulse Rate:  [97-107] 99 (05/03 0732) Resp:  [11-24] 18 (05/03 0732) BP: (99-132)/(27-86) 113/55 (05/03 0732) SpO2:  [100 %] 100 % (05/03 0732)   General appearance: alert, no distress and Non-verbal. Squeezes hands to commands, flickers feet to commands. Twitching frequently.  Eyes: conjunctivae/corneas clear. PERRL, EOM's intact. Fundi benign. Resp: clear to auscultation bilaterally Chest wall: no tenderness, Right HD cath in place, clean and dry. Site unremarkable.  Cardio: regular rate and rhythm and S1, S2 normal GI: soft, non-tender; bowel sounds normal. New Flexi-seal in place for diarrhea. Receiving TFs Extremities: extremities normal, atraumatic, no cyanosis or edema Wound: Left inner thigh. Wound vac in place and intact. Changed today.   Lab Results  Recent Labs  May 05, 2016 0230 06/12/16 0236  WBC 18.0* 12.4*  HGB 7.9* 9.0*  HCT 26.7* 30.2*  NA 137 137  K 4.5 5.4*  CL 98* 97*  CO2 23 24  BUN 21* 39*  CREATININE 2.70* 3.62*  Liver Panel  Recent Labs  07/02/2016 0230 06/12/16 0236  PROT 7.9 8.3*  ALBUMIN 1.7* 1.8*  AST 18 48*  ALT 22 36  ALKPHOS 116 167*  BILITOT 1.9* 1.2   Sedimentation Rate No results for input(s): ESRSEDRATE in the last 72 hours. C-Reactive Protein No results for input(s): CRP in the last 72 hours.  Microbiology: Recent Results (from the past 240 hour(s))  Culture, blood (Routine X 2) w Reflex to ID Panel     Status: None (Preliminary result)   Collection Time: 06/10/16 12:30 PM  Result Value Ref Range Status   Specimen Description BLOOD RIGHT HEMODIALYSIS CATHETER  Final   Special Requests   Final    BOTTLES DRAWN AEROBIC  AND ANAEROBIC Blood Culture results may not be optimal due to an excessive volume of blood received in culture bottles   Culture NO GROWTH 3 DAYS  Final   Report Status PENDING  Incomplete  Culture, blood (Routine X 2) w Reflex to ID Panel     Status: None (Preliminary result)   Collection Time: 06/10/16 12:45 PM  Result Value Ref Range Status   Specimen Description BLOOD RIGHT URINE, CATHETERIZED  Final   Special Requests   Final    BOTTLES DRAWN AEROBIC AND ANAEROBIC Blood Culture adequate volume   Culture NO GROWTH 3 DAYS  Final   Report Status PENDING  Incomplete  Aerobic/Anaerobic Culture (surgical/deep wound)     Status: None (Preliminary result)   Collection Time: 07/03/2016  1:04 PM  Result Value Ref Range Status   Specimen Description WOUND  Final   Special Requests LEFT THIGH ABSCESS  Final   Gram Stain NO WBC SEEN FEW GRAM NEGATIVE RODS   Final   Culture CULTURE REINCUBATED FOR BETTER GROWTH  Final   Report Status PENDING  Incomplete    Studies/Results: No results found.   Assessment/Plan: ESRD - HD yesterday via Right AVF -Can HD line be removed?  Possible IE -recent acute multifocal CVAs and fever -BCx 4/30 >> NGTD -Continue Vanco/Cefepime while awaiting cultures -Await TEE - scheduled 5/4  Left Thigh Wound -s/p debriedment 05/21/16 (poly microbial) -5/1 Debridement >> few GNR              - await ID  Reactive RPR 1:1 - HIV (-) -T Pallidum Abs negative - Followedup with state health dept, will treat as false positive.   Severe, protein calorie malnutrition -nepro TF with prostat started 5/1  Diarrhea  Total days of antibiotics: 5 vanco/zosyn --> Vanc/Cefepime (5/1)         [name] Infectious Diseases (pager) [phone] [email or web address] 06/13/2016, 10:08 AM  LOS: 5 days

## 2016-06-13 NOTE — Progress Notes (Signed)
SLP Cancellation Note  Patient Details Name: Linda DykesDavenia McKinnon [name] MRN: [id number] DOB: 06/18/1975   Cancelled treatment:       Reason Eval/Treat Not Completed: Fatigue/lethargy limiting ability to participate. Unable to complete swallow evaluation all week due to lethargy.  Will continue efforts.   Blenda MountsCouture, [name] 06/13/2016, 12:00 PM

## 2016-06-13 NOTE — Progress Notes (Signed)
   [facility] has been requested to perform a transesophageal echocardiogram on [name] for bacteremia.  After careful review of history and examination, the risks and benefits of transesophageal echocardiogram have been explained including risks of esophageal damage, perforation (1:10,000 risk), bleeding, pharyngeal hematoma as well as other potential complications associated with conscious sedation including aspiration, arrhythmia, respiratory failure and death. Alternatives to treatment were discussed, questions were answered. The patient is non verbal. I called the phone number given by the patient's husband as emergency contact and spoke with patient's sister [name]. She understands the above and gives permission to proceed with the TEE tomorrow. She states that the husband and all of the patient's family are in agreement.   Berton BonJanine Ninoska Goswick, NP  06/13/2016 3:22 PM

## 2016-06-13 NOTE — Progress Notes (Signed)
Occupational Therapy Treatment Patient Details Name: Linda DykesDavenia McKinnon [name] MRN: [id number] DOB: 08/31/1975 Today's Date: 06/13/2016    History of present illness Patient is a 41 yo female admitted 06/05/2016 with [facility].  Patient with Rt infarcts, occluded MCA, Rt ICA, and Lt vertebral artery. No verbalizations or voluntary movement.   PMH:  CVA with RLE weak, DM, obesity, chronic lymphedema, wound Rt calf, VAC on Lt thigh, HTN, CHF, depression, ESRD on HD, asthma   OT comments  This 41 yo female admitted with above presents to acute OT today making progress with eye opening (at least 10 %), tolerating EOB balance (10 minutes), following 1 step commands (1 with increased time). She will continue to benefit from acute OT with follow up OT at SNF.  Follow Up Recommendations  SNF;Supervision/Assistance - 24 hour    Equipment Recommendations  Other (comment) (TBD at next venue)       Precautions / Restrictions Precautions Precautions: Fall Precaution Comments: bil inner thigh wounds that were surgically debrided in OR--now wound vac Restrictions Weight Bearing Restrictions: No       Mobility Bed Mobility Overal bed mobility: Needs Assistance Bed Mobility: Supine to Sit;Sit to Supine     Supine to sit: Total assist;+2 for physical assistance Sit to supine: Total assist;+2 for physical assistance           Balance Overall balance assessment: Needs assistance Sitting-balance support: No upper extremity supported;Feet supported Sitting balance-Leahy Scale: Zero Sitting balance - Comments: We had pt sit EOB ~10 minutes EOB trying to get her to work on head/trunk control, eye movements, and moving arms/legs. No attempt at anything except she did move her LUE on command with increased time and moved LLE/RLE when feet were touched ("tickled')                                   ADL either performed or assessed with clinical judgement        Vision    Additional Comments: Pt still only minimally opening eyes, but more so she had them open today than on eval 3 days ago. No controlled eye movements and no blink to threat either eye          Cognition Arousal/Alertness: Lethargic Behavior During Therapy: Flat affect Overall Cognitive Status: Impaired/Different from baseline Area of Impairment: Following commands                       Following Commands: Follows one step commands with increased time       General Comments: more eye opening today than on OT eval 3 days ago. I told her we would lay her back down if she would move one of her arms as I touched her arms at the same time I told her this, with minimal delay she pulled her RUE off her lap and extended at the shoulder. Could not get her to do it with her LUE                   Pertinent Vitals/ Pain       Pain Location: withdrew to pain (nail bed pressure) on left foot, right foot, and generalized movements to nail bed pressure on LUE         Frequency  Min 2X/week        Progress Toward Goals  OT Goals(current goals can now be found  in the care plan section)  Progress towards OT goals: Progressing toward goals     Plan Discharge plan remains appropriate    Co-evaluation    PT/OT/SLP Co-Evaluation/Treatment: Yes Reason for Co-Treatment: Complexity of the patient's impairments (multi-system involvement);For patient/therapist safety   OT goals addressed during session: Strengthening/ROM      AM-PAC PT "6 Clicks" Daily Activity     Outcome Measure   Help from another person eating meals?: Total Help from another person taking care of personal grooming?: Total Help from another person toileting, which includes using toliet, bedpan, or urinal?: Total Help from another person bathing (including washing, rinsing, drying)?: Total Help from another person to put on and taking off regular upper body clothing?: Total Help from another person to put  on and taking off regular lower body clothing?: Total 6 Click Score: 6    End of Session    OT Visit Diagnosis: Muscle weakness (generalized) (M62.81);Low vision, both eyes (H54.2);Other symptoms and signs involving the nervous system (R29.898);Other symptoms and signs involving cognitive function;Cognitive communication deficit (R41.841);Hemiplegia and hemiparesis Symptoms and signs involving cognitive functions: Cerebral infarction Hemiplegia - Right/Left:  (both it appears) Hemiplegia - dominant/non-dominant:  (both) Hemiplegia - caused by: Cerebral infarction   Activity Tolerance Patient limited by lethargy   Patient Left in bed;with bed alarm set   Nurse Communication  (Wound vac popped off when were returned pt to supine, tube feeds won't hold)        Time: 1340-1415 OT Time Calculation (min): 35 min  Charges: OT General Charges $OT Visit: 1 Procedure OT Treatments $Therapeutic Activity: 8-22 mins  Ignacia Palma, OTR/L [phone] 06/13/2016

## 2016-06-14 ENCOUNTER — Other Ambulatory Visit (HOSPITAL_COMMUNITY): Payer: Medicaid Other

## 2016-06-14 DIAGNOSIS — Z515 Encounter for palliative care: Secondary | ICD-10-CM

## 2016-06-14 DIAGNOSIS — I330 Acute and subacute infective endocarditis: Secondary | ICD-10-CM

## 2016-06-14 LAB — CBC
HCT: 21.4 % — ABNORMAL LOW (ref 36.0–46.0)
HEMATOCRIT: 22.5 % — AB (ref 36.0–46.0)
HEMOGLOBIN: 6.4 g/dL — AB (ref 12.0–15.0)
HEMOGLOBIN: 6.8 g/dL — AB (ref 12.0–15.0)
MCH: 26.7 pg (ref 26.0–34.0)
MCH: 26.9 pg (ref 26.0–34.0)
MCHC: 29.9 g/dL — ABNORMAL LOW (ref 30.0–36.0)
MCHC: 30.2 g/dL (ref 30.0–36.0)
MCV: 89.2 fL (ref 78.0–100.0)
MCV: 88.9 fL (ref 78.0–100.0)
PLATELETS: 238 10*3/uL (ref 150–400)
Platelets: 229 10*3/uL (ref 150–400)
RBC: 2.4 MIL/uL — AB (ref 3.87–5.11)
RBC: 2.53 MIL/uL — AB (ref 3.87–5.11)
RDW: 17.8 % — ABNORMAL HIGH (ref 11.5–15.5)
RDW: 17.9 % — ABNORMAL HIGH (ref 11.5–15.5)
WBC: 17.7 10*3/uL — ABNORMAL HIGH (ref 4.0–10.5)
WBC: 18.6 10*3/uL — AB (ref 4.0–10.5)

## 2016-06-14 LAB — RENAL FUNCTION PANEL
ALBUMIN: 1.4 g/dL — AB (ref 3.5–5.0)
ANION GAP: 13 (ref 5–15)
BUN: 63 mg/dL — AB (ref 6–20)
CHLORIDE: 92 mmol/L — AB (ref 101–111)
CO2: 24 mmol/L (ref 22–32)
CREATININE: 3.27 mg/dL — AB (ref 0.44–1.00)
Calcium: 8.8 mg/dL — ABNORMAL LOW (ref 8.9–10.3)
GFR calc Af Amer: 19 mL/min — ABNORMAL LOW (ref >60)
GFR calc non Af Amer: 17 mL/min — ABNORMAL LOW (ref >60)
GLUCOSE: 311 mg/dL — AB (ref 65–99)
POTASSIUM: 2.8 mmol/L — AB (ref 3.5–5.1)
Phosphorus: 2.6 mg/dL (ref 2.5–4.6)
Sodium: 129 mmol/L — ABNORMAL LOW (ref 135–145)

## 2016-06-14 LAB — AEROBIC/ANAEROBIC CULTURE W GRAM STAIN (SURGICAL/DEEP WOUND)

## 2016-06-14 LAB — AEROBIC/ANAEROBIC CULTURE (SURGICAL/DEEP WOUND): GRAM STAIN: NONE SEEN

## 2016-06-14 LAB — GLUCOSE, CAPILLARY
GLUCOSE-CAPILLARY: 314 mg/dL — AB (ref 65–99)
GLUCOSE-CAPILLARY: 279 mg/dL — AB (ref 65–99)
GLUCOSE-CAPILLARY: 183 mg/dL — AB (ref 65–99)
GLUCOSE-CAPILLARY: 216 mg/dL — AB (ref 65–99)
Glucose-Capillary: 169 mg/dL — ABNORMAL HIGH (ref 65–99)
Glucose-Capillary: 311 mg/dL — ABNORMAL HIGH (ref 65–99)

## 2016-06-14 LAB — VAS US CAROTID
LCCADSYS: -73 cm/s
LCCAPSYS: 120 cm/s
LICADDIAS: -32 cm/s
Left CCA dist dias: -17 cm/s
Left CCA prox dias: 4 cm/s
Left ICA dist sys: -92 cm/s
Left ICA prox dias: -19 cm/s
Left ICA prox sys: -63 cm/s
RCCAPDIAS: 12 cm/s
RIGHT ECA DIAS: -7 cm/s
RIGHT VERTEBRAL DIAS: 20 cm/s
Right CCA prox sys: 71 cm/s
Right cca dist sys: -18 cm/s

## 2016-06-14 LAB — BASIC METABOLIC PANEL
ANION GAP: 12 (ref 5–15)
BUN: 21 mg/dL — ABNORMAL HIGH (ref 6–20)
CHLORIDE: 94 mmol/L — AB (ref 101–111)
CO2: 28 mmol/L (ref 22–32)
Calcium: 8 mg/dL — ABNORMAL LOW (ref 8.9–10.3)
Creatinine, Ser: 1.36 mg/dL — ABNORMAL HIGH (ref 0.44–1.00)
GFR calc non Af Amer: 48 mL/min — ABNORMAL LOW (ref >60)
GFR, EST AFRICAN AMERICAN: 56 mL/min — AB (ref >60)
Glucose, Bld: 172 mg/dL — ABNORMAL HIGH (ref 65–99)
Potassium: 2.5 mmol/L — CL (ref 3.5–5.1)
Sodium: 134 mmol/L — ABNORMAL LOW (ref 135–145)

## 2016-06-14 LAB — PREPARE RBC (CROSSMATCH)

## 2016-06-14 MED ORDER — METOPROLOL TARTRATE 5 MG/5ML IV SOLN
5.0000 mg | Freq: Three times a day (TID) | INTRAVENOUS | Status: DC
  Administered 2016-06-14 – 2016-06-15 (×3): 5 mg via INTRAVENOUS
  Filled 2016-06-14 (×3): qty 5

## 2016-06-14 MED ORDER — SODIUM CHLORIDE 0.9 % IV SOLN: 100.0000 mL | INTRAVENOUS | Status: DC | PRN

## 2016-06-14 MED ORDER — LIDOCAINE-PRILOCAINE 2.5-2.5 % EX CREA: 1.0000 "application " | TOPICAL_CREAM | CUTANEOUS | Status: DC | PRN

## 2016-06-14 MED ORDER — ALTEPLASE 2 MG IJ SOLR
2.0000 mg | Freq: Once | INTRAMUSCULAR | Status: DC | PRN
Start: 2016-06-14 — End: 2016-06-14

## 2016-06-14 MED ORDER — VANCOMYCIN HCL IN DEXTROSE 1-5 GM/200ML-% IV SOLN
INTRAVENOUS | Status: AC
  Filled 2016-06-14: qty 200

## 2016-06-14 MED ORDER — FLUOXETINE HCL 20 MG/5ML PO SOLN
20.0000 mg | Freq: Every day | ORAL | Status: DC
  Administered 2016-06-14 – 2016-07-07 (×22): 20 mg
  Filled 2016-06-14 (×24): qty 5

## 2016-06-14 MED ORDER — CLONIDINE HCL 0.3 MG/24HR TD PTWK
0.3000 mg | MEDICATED_PATCH | TRANSDERMAL | Status: DC
  Administered 2016-06-14: 0.3 mg via TRANSDERMAL
  Filled 2016-06-14: qty 1

## 2016-06-14 MED ORDER — HEPARIN SODIUM (PORCINE) 1000 UNIT/ML DIALYSIS
1000.0000 [IU] | INTRAMUSCULAR | Status: DC | PRN
  Filled 2016-06-14: qty 1

## 2016-06-14 MED ORDER — LIDOCAINE HCL (PF) 1 % IJ SOLN: 5.0000 mL | INTRAMUSCULAR | Status: DC | PRN

## 2016-06-14 MED ORDER — SODIUM CHLORIDE 0.9 % IV SOLN: Freq: Once | INTRAVENOUS | Status: DC

## 2016-06-14 MED ORDER — HEPARIN SODIUM (PORCINE) 1000 UNIT/ML DIALYSIS: 4000.0000 [IU] | Freq: Once | INTRAMUSCULAR | Status: DC

## 2016-06-14 MED ORDER — ACETAMINOPHEN 325 MG PO TABS
650.0000 mg | ORAL_TABLET | Freq: Three times a day (TID) | ORAL | Status: DC
  Administered 2016-06-14 – 2016-06-17 (×9): 650 mg
  Filled 2016-06-14 (×9): qty 2

## 2016-06-14 MED ORDER — PENTAFLUOROPROP-TETRAFLUOROETH EX AERO: 1.0000 "application " | INHALATION_SPRAY | CUTANEOUS | Status: DC | PRN

## 2016-06-14 NOTE — Progress Notes (Signed)
SLP Cancellation Note  Patient Details Name: Linda DykesDavenia McKinnon [name] MRN: [id number] DOB: 07/28/1975   Cancelled evaluation:    Pt has not been capable of participating in evaluation since initial orders were written on 4/28.  Palliative meeting is planned for this afternoon.  Our services will respectfully sign off.  Please refer again if there are improvements in her condition.  Thank you,                                                                                     Blenda MountsCouture, Dajon Lazar Laurice 06/14/2016, 9:59 AM

## 2016-06-14 NOTE — Progress Notes (Addendum)
[facility] TEAM 1 - Stepdown/ICU TEAM  [name]  UJW:[id number] DOB: 1975/09/22 DOA: 06/06/2016 PCP: Marletta Lor, NP    Brief Narrative:  41 y.o.woman with a history of CVA, DM2, morbid obesity, chronic lymphedema, HTN, chronic diastolic heart failure, depression, and admission 3/21 > 4/20 for acute encephalopathy, acute renal failure on CKD 5 requiring HD, decompensated heart failure, and infected leg ulcer.  She was discharged to a SNF.  She had been dialyzing, presumably on MWF.  Upon completion of her HD session on the day of presentation, she was noted to have mental status changes.  It was not clear to the personnel when she was last known normal.  She was transferred to the ED where she had a WBC count of 20, Hgb 9, Ammonia level normal.  Head CT was negative for acute process.  MRI/MRA noted acute right deep gray nuclei and right hippocampal infarcts with an occluded R MCA, Right ICA, and Left vertebral artery.  Subjective:  Doesn't communicate. No acute events overnight.  Tolerated HD well this morning.  Assessment & Plan:  Acute metabolic encephalopathy in setting of chronic encephalopathy During last admit mental status apparently waxed and waned - Psych saw in consultation and suggested tx for depression  - suspect her acute change is due to TME in setting of sepsis related to her L thigh wound and possible SBE -Cont vanc and Cefepime now.  - cont supportive care   L posterior/medial thigh wound s/p debridement by Len Childs 05/21/16 - completed a course of abx tx on 05/31/16  - CT of the thigh has noted a new area c/w an abscess not communicating with the currently opened area  - Gen Surgery- Excision of skin and subcutaneous tissue of left thigh (25 x 8 x 3 cm) on 5/1 - broad abx coverage to include MRSA coverage to continue   SBE - Possible 7x10mm vegetation L coronary cusp  Blood cx during last admit 1 of 6 + for coag neg Staph strongly suggestive of contaminant -  no such mass noted on TTE 03/22/16  -Cardiology consulted for TEE. Pending eval. Checked EMR, TEE rescheduled for 5/7  Acute, multifocal CVAs with history of multiple prior CVAs Concerns for cardiac lesion as seen on echocardiogram which could likely be the source of her stroke She will need TEE - rescheduled for 5/7  ESRD on HD Nephrology following - for HD M/W/F  Need to remove TLC. Graft functioning well   Anemia likely from chronic disease  -no signs of acute blood loss -Will order 2 units PRBC (5/4)  Reactive RPR Quant f/u RPR 1:1 w/ negative T pallidum abs - ?false negative - ID following   Morbid obesity - Body mass index is 41.41 kg/m.   Nutrition  Cortrak for nutrition. Consulted Palliative team to help with the discussion for peg placement.   HTN BP well controlled/stable   DM2 CBG elevated, didn't get her lantus yesterday. Will start it today.  Premeal coverage this afternoon. Resistant ISS.   Chronic diastolic congestive heart failure TTE 4/28 noted EF 55-60% w/ grade 1 DD - EDW per HD center is 105.5kg  Filed Weights   06/08/16 0421 06/09/16 0500  Weight: 105.3 kg (232 lb 2.3 oz) 102.7 kg (226 lb 6.6 oz)   Palliative care team consulted.   DVT prophylaxis: SCDs Code Status: FULL CODE Family Communication: no family present at time of exam.  Disposition Plan: SDU  Consultants:  Neurology  Gen Surgery Nephrology  Procedures: none  Antimicrobials:  Zosyn 4/30 >5/2 Vanc 4/30 > Cefepime 5/2 >  Objective: Blood pressure (!) 101/50, pulse (!) 105, temperature 98 F (36.7 C), temperature source Oral, resp. rate (!) 27, height 5\' 2"  (1.575 m), weight 102.7 kg (226 lb 6.6 oz), SpO2 100 %.  Intake/Output Summary (Last 24 hours) at 06/14/16 1349 Last data filed at 06/14/16 1227  Gross per 24 hour  Intake           643.33 ml  Output             -185 ml  Net           828.33 ml   Filed Weights   06/08/16 0421 06/09/16 0500  Weight: 105.3 kg (232 lb  2.3 oz) 102.7 kg (226 lb 6.6 oz)    Examination: General: No acute respiratory distress - obtunded  Lungs: Clear to auscultation bilaterally without wheeze - distant BS related to body habitus  Cardiovascular: Regular rate and rhythm without murmur - distant HS  Abdomen: Obese, soft, bowel sounds present, no organomegaly, no rebound - Cortrak in L nare Extremities: L medial thigh wound dressed and presently dry - change of chronic venous statis dermatitis noted B Neurologic: Obtunded, no Babinski - does not withdraw from painful stimuli - opens eyes intermittently but does not follow examiner    CBC:  Recent Labs Lab 05/14/2016 1745  06/08/16 0448  06/10/16 1349 06/23/2016 0230 06/12/16 0236 06/14/16 0800 06/14/16 1001  WBC 20.0*  --  18.1*  < > 17.7* 18.0* 12.4* 17.7* 18.6*  NEUTROABS 16.4*  --  14.4*  --   --   --   --   --   --   HGB 9.1*  < > 8.9*  < > 7.9* 7.9* 9.0* 6.4* 6.8*  HCT 30.3*  < > 29.6*  < > 26.2* 26.7* 30.2* 21.4* 22.5*  MCV 91.0  --  90.2  < > 89.7 91.1 91.0 89.2 88.9  PLT 406*  --  419*  < > 323 280 PLATELET CLUMPS NOTED ON SMEAR, COUNT APPEARS ADEQUATE 238 229  < > = values in this interval not displayed. Basic Metabolic Panel:  Recent Labs Lab 06/09/16 0258 06/10/16 1300 07/10/2016 0230 06/12/16 0236 06/12/16 0532 06/14/16 0800 06/14/16 1001  NA 137 139 137 137  --  129* 134*  K 3.8 3.2* 4.5 5.4*  --  2.8* 2.5*  CL 97* 100* 98* 97*  --  92* 94*  CO2 25 26 23 24   --  24 28  GLUCOSE 157* 123* 140* 272*  --  311* 172*  BUN 37* 30* 21* 39*  --  63* 21*  CREATININE 3.99* 3.15* 2.70* 3.62*  --  3.27* 1.36*  CALCIUM 9.5 8.9 8.8* 9.3  --  8.8* 8.0*  MG 2.2  --   --   --   --   --   --   PHOS 5.0* 2.9  --   --  5.1* 2.6  --    GFR: Estimated Creatinine Clearance: 62.3 mL/min (A) (by C-G formula based on SCr of 1.36 mg/dL (H)).  Liver Function Tests:  Recent Labs Lab 05/24/2016 1745 06/09/16 0258 06/10/16 1300 06/14/2016 0230 06/12/16 0236 06/14/16 0800   AST 28 17  --  18 48*  --   ALT 30 25  --  22 36  --   ALKPHOS 113 101  --  116 167*  --   BILITOT 1.0 0.8  --  1.9*  1.2  --   PROT 8.5* 8.0  --  7.9 8.3*  --   ALBUMIN 2.1* 1.8* 1.7* 1.7* 1.8* 1.4*    Recent Labs Lab 05/31/2016 1828  AMMONIA 31    HbA1C: Hgb A1c MFr Bld  Date/Time Value Ref Range Status  06/09/2016 02:58 AM 6.7 (H) 4.8 - 5.6 % Final    Comment:    (NOTE)         Pre-diabetes: 5.7 - 6.4         Diabetes: >6.4         Glycemic control for adults with diabetes: <7.0   06/08/2016 04:48 AM 6.6 (H) 4.8 - 5.6 % Final    Comment:    (NOTE)         Pre-diabetes: 5.7 - 6.4         Diabetes: >6.4         Glycemic control for adults with diabetes: <7.0     CBG:  Recent Labs Lab 06/13/16 1923 06/13/16 2256 06/14/16 0308 06/14/16 0733 06/14/16 1258  GLUCAP 353* 311* 314* 279* 169*    Scheduled Meds: . aspirin  300 mg Rectal Daily  . chlorhexidine  15 mL Mouth Rinse BID  . cloNIDine  0.3 mg Transdermal Weekly  . feeding supplement (NEPRO CARB STEADY)  1,000 mL Oral Q24H  . feeding supplement (PRO-STAT SUGAR FREE 64)  30 mL Per Tube TID  . insulin aspart  0-9 Units Subcutaneous Q4H  . mouth rinse  15 mL Mouth Rinse q12n4p  . metoprolol  5 mg Intravenous Q8H  . pneumococcal 23 valent vaccine  0.5 mL Intramuscular Tomorrow-1000  . Zinc Oxide   Topical BID     LOS: 6 days    Stephania Fragmin, MD [facility] Office  [phone] Pager - Text Page per Loretha Stapler as per below:  On-Call/Text Page:      [email or web address]      password [email or web address]  If 7PM-7AM, please contact night-coverage [email or web address] Password [email or web address] 06/14/2016, 1:49 PM

## 2016-06-14 NOTE — Progress Notes (Addendum)
Inpatient Diabetes Program Recommendations  AACE/ADA: New Consensus Statement on Inpatient Glycemic Control (2015)  Target Ranges:  Prepandial:   less than 140 mg/dL      Peak postprandial:   less than 180 mg/dL (1-2 hours)      Critically ill patients:  140 - 180 mg/dL   Lab Results  Component Value Date   GLUCAP 279 (H) 06/14/2016   HGBA1C 6.7 (H) 06/09/2016    Review of Glycemic Control  Diabetes history: DM 2 Outpatient Diabetes medications: Lantus 14 units Current orders for Inpatient glycemic control:  sensitive correction q 4 hrs.  Please consider adding lantus 10 units while here.  Thank you Lenor CoffinAnn Taina Landry, RN, MSN, CDE  Diabetes Inpatient Program Office: [phone] Pager: [phone] 8:00 am to 5:00 pm

## 2016-06-14 NOTE — Progress Notes (Addendum)
[facility] Progress Note   Subjective: not responding  Vitals:   06/14/16 0758 06/14/16 0815 06/14/16 0830 06/14/16 0900  BP: 126/64 (!) 141/67 (!) 123/51 (!) 118/55  Pulse: (!) 102 (!) 101 (!) 103 (!) 105  Resp: 19 (!) 25 (!) 24 (!) 26  Temp:      TempSrc:      SpO2:  100% 100% 98%  Weight:      Height:        Inpatient medications: . aspirin  300 mg Rectal Daily  . chlorhexidine  15 mL Mouth Rinse BID  . feeding supplement (NEPRO CARB STEADY)  1,000 mL Oral Q24H  . feeding supplement (PRO-STAT SUGAR FREE 64)  30 mL Per Tube TID  . [START ON 06/15/2016] heparin  4,000 Units Dialysis Once in dialysis  . insulin aspart  0-9 Units Subcutaneous Q4H  . mouth rinse  15 mL Mouth Rinse q12n4p  . metoprolol  5 mg Intravenous Q8H  . pneumococcal 23 valent vaccine  0.5 mL Intramuscular Tomorrow-1000  . Zinc Oxide   Topical BID   . sodium chloride    . sodium chloride    . sodium chloride    . ceFEPime (MAXIPIME) IV Stopped (06/13/16 2142)  . vancomycin    . vancomycin Stopped (06/12/16 1146)   sodium chloride, sodium chloride, [DISCONTINUED] acetaminophen **OR** [DISCONTINUED] acetaminophen (TYLENOL) oral liquid 160 mg/5 mL **OR** acetaminophen, alteplase, heparin, lidocaine (PF), lidocaine-prilocaine, pentafluoroprop-tetrafluoroeth  Exam: Not responding, obese, temporal musc wasting No jvd Chest clear bilat RRR tachy no RG ABd obese soft nondistended Ext - wound VAC L thigh, some dark blistering superior to wound, no edema; RLE no edema LUA AVG+bruit R IJ cath Neuro- is not responding to voice/ stimulation  Dialysis:  [facility]  4:30 hours, 400/800  105.5kg   3K/2Ca   TDC R IJ/ maturing LUA AVG  Hep 4400 - Mircera IV q 2 weeks (last given 4/25) - Calcitriol 0.92mcg PO q HD  Assessment: 1. Acute multifocal CVAs: Plan per neuro and intensivist 2. AMS: poorly responsive, CVA +/- infection 3. L thigh soft-tissue abscess/ infection: sp I&D 4/10 and  repeat I&D 5/2 yesterday by gen surg. VAC in place, IV abx vanc/ zosyn.   4. Abnormal ECHO: Possible vegetation on L coronary cusp, could be cause of multifocal CVA. TEE planned. 1/6 +bcx's for CNSS was felt to be contaminant most likely per ID.   5. ESRD: MWF HD. Using L arm AVG, IR to remove HD cath today after HD 6. HTN: home coreg on hold, getting q 8hr IV metoprolol. Will start catapres patch #3 7. Volume: 3kg under dry wt, no excess on exam 8. Anemia: Hgb 6.4 today, repeating and ordered 2u prbc's. Just got mircera 150 ug on 4/25. Not candidate for IV Fe with infection/ ^^ferritin. Last tsat 4/19 not calc due to low transferrin. Ferritin 450 > 1000> 3100 over last 3 mos.   9. Metabolic bone disease: CorrCa high, Phos ok. Holding VDRA/ binders for now 10. Nutrition: Alb very low, getting Cortrak TF's.   Plan - HD today, repeat labs, transfuse 2u prbc if still low; catapres patch   Vinson Moselle MD [facility] pager [phone]   06/14/2016, 9:50 AM    Recent Labs Lab 06/10/16 1300 06/29/2016 0230 06/12/16 0236 06/12/16 0532 06/14/16 0800  NA 139 137 137  --  129*  K 3.2* 4.5 5.4*  --  2.8*  CL 100* 98* 97*  --  92*  CO2 26 23 24   --  24  GLUCOSE 123* 140* 272*  --  311*  BUN 30* 21* 39*  --  63*  CREATININE 3.15* 2.70* 3.62*  --  3.27*  CALCIUM 8.9 8.8* 9.3  --  8.8*  PHOS 2.9  --   --  5.1* 2.6    Recent Labs Lab 06/09/16 0258  06/18/2016 0230 06/12/16 0236 06/14/16 0800  AST 17  --  18 48*  --   ALT 25  --  22 36  --   ALKPHOS 101  --  116 167*  --   BILITOT 0.8  --  1.9* 1.2  --   PROT 8.0  --  7.9 8.3*  --   ALBUMIN 1.8*  < > 1.7* 1.8* 1.4*  < > = values in this interval not displayed.  Recent Labs Lab 05/22/2016 1745  06/08/16 0448  07/05/2016 0230 06/12/16 0236 06/14/16 0800  WBC 20.0*  --  18.1*  < > 18.0* 12.4* 17.7*  NEUTROABS 16.4*  --  14.4*  --   --   --   --   HGB 9.1*  < > 8.9*  < > 7.9* 9.0* 6.4*  HCT 30.3*  < > 29.6*  < > 26.7*  30.2* 21.4*  MCV 91.0  --  90.2  < > 91.1 91.0 89.2  PLT 406*  --  419*  < > 280 PLATELET CLUMPS NOTED ON SMEAR, COUNT APPEARS ADEQUATE 238  < > = values in this interval not displayed. Iron/TIBC/Ferritin/ %Sat    Component Value Date/Time   IRON 32 05/30/2016 1002   TIBC NOT CALCULATED 05/30/2016 1002   FERRITIN 3,194 (H) 05/30/2016 1002   IRONPCTSAT NOT CALCULATED 05/30/2016 1002

## 2016-06-14 NOTE — Progress Notes (Signed)
CRITICAL VALUE ALERT  Critical value received: Hemoglobin 6.4   Date of notification:  06/14/16  Time of notification:  9   Critical value read back:yes  Nurse who received alert:  Maggie  MD notified (1st page):  Nelson ChimesAmin  Time of first page:  9:10  Responding MD:  Nelson ChimesAmin

## 2016-06-14 NOTE — Progress Notes (Addendum)
Pharmacy Antibiotic Note  Linda DykesDavenia [name] [name] is a 41 y.o. female admitted on 06/09/2016 with sepsis, possible vegetation seen on ECHO. Awaiting TEE today. Patient has ESRD, dialyzes MWF- currently in HD, on schedule. Has been tolerating full sessions.  Noted palliative care meeting planned for this afternoon  Plan: Cefepime 1g IV q24h Vancomycin 1g IV qHD-MWF Monitor c/s, TEE results, HD schedule/tolerance, VR prn  Height: 5\' 2"  (157.5 cm) Weight:  (Unable to weigh with current bed scale) IBW/kg (Calculated) : 50.1  Temp (24hrs), Avg:98.6 F (37 C), Min:98.3 F (36.8 C), Max:99.3 F (37.4 C)   Recent Labs Lab 06/08/16 0047 06/08/16 0448 06/09/16 0258 06/10/16 1300 06/10/16 1349 06/12/2016 0230 06/16/2016 1413 06/12/16 0236 06/14/16 0800  WBC  --  18.1* 18.4*  --  17.7* 18.0*  --  12.4* 17.7*  CREATININE  --  3.06* 3.99* 3.15*  --  2.70*  --  3.62* 3.27*  LATICACIDVEN 2.0* 1.5  --   --   --   --  1.4  --   --     Estimated Creatinine Clearance: 25.9 mL/min (A) (by C-G formula based on SCr of 3.27 mg/dL (H)).    Allergies  Allergen Reactions  . Azithromycin Other (See Comments)    Nose bleeding event   Vancomycin 4/30>> Zosyn 4/30>> 5/2 Cefepime 5/2 >>  RPR Reactive- T Pallidum Abx neg, likely false positive RPR 4/30 BCx: ngtd 5/1 LLE abscess: few GNR> reincubated  Linda [name] D. Linda [name], PharmD, BCPS Clinical Pharmacist Pager: [phone] 06/14/2016 10:13 AM

## 2016-06-14 NOTE — Clinical Social Work Note (Signed)
CSW continues to follow for discharge needs.  Jerremy Maione, CSW [phone]  

## 2016-06-14 NOTE — Progress Notes (Signed)
INFECTIOUS DISEASE PROGRESS NOTE  ID: [name] is a 41 y.o. female with  Principal Problem:   CVA (cerebral vascular accident) (HCC) Active Problems:   Morbid obesity (HCC)   Essential hypertension   Acute metabolic encephalopathy   Uncontrolled diabetes mellitus with diabetic nephropathy, with long-term current use of insulin (HCC)   ESRD (end stage renal disease) (HCC)   Depression due to physical illness   Wound abscess  Subjective: Non-verbal.  Abtx:  Anti-infectives    Start     Dose/Rate Route Frequency Ordered Stop   06/14/16 0931  vancomycin (VANCOCIN) 1-5 GM/200ML-% IVPB    Comments:  Matilde HaymakerSerrano, [name]   : cabinet override      06/14/16 0931 06/14/16 1113   06/12/16 1700  ceFEPIme (MAXIPIME) 1 g in dextrose 5 % 50 mL IVPB     1 g 100 mL/hr over 30 Minutes Intravenous Every 24 hours 06/12/16 1648     06/12/16 0959  vancomycin (VANCOCIN) 1-5 GM/200ML-% IVPB    Comments:  [name], [name]   : cabinet override      06/12/16 0959 06/12/16 1015   06/10/16 1200  vancomycin (VANCOCIN) IVPB 1000 mg/200 mL premix     1,000 mg 200 mL/hr over 60 Minutes Intravenous Every M-W-F (Hemodialysis) 06/10/16 0851     06/10/16 1000  piperacillin-tazobactam (ZOSYN) IVPB 3.375 g  Status:  Discontinued     3.375 g 12.5 mL/hr over 240 Minutes Intravenous Every 12 hours 06/10/16 0851 06/12/16 1629   06/10/16 1000  vancomycin (VANCOCIN) 2,250 mg in sodium chloride 0.9 % 500 mL IVPB  Status:  Discontinued     2,250 mg 250 mL/hr over 120 Minutes Intravenous Every 12 hours 06/10/16 0851 06/10/16 0858   06/10/16 1000  vancomycin (VANCOCIN) 2,250 mg in sodium chloride 0.9 % 500 mL IVPB     2,250 mg 250 mL/hr over 120 Minutes Intravenous  Once 06/10/16 0858 06/10/16 1640      Medications:  Scheduled: . acetaminophen  650 mg Per Tube TID  . aspirin  300 mg Rectal Daily  . chlorhexidine  15 mL Mouth Rinse BID  . cloNIDine  0.3 mg Transdermal Weekly  . feeding supplement (NEPRO  CARB STEADY)  1,000 mL Oral Q24H  . feeding supplement (PRO-STAT SUGAR FREE 64)  30 mL Per Tube TID  . FLUoxetine  20 mg Per Tube Daily  . insulin aspart  0-9 Units Subcutaneous Q4H  . mouth rinse  15 mL Mouth Rinse q12n4p  . metoprolol  5 mg Intravenous Q8H  . pneumococcal 23 valent vaccine  0.5 mL Intramuscular Tomorrow-1000  . Zinc Oxide   Topical BID    Objective: Vital signs in last 24 hours: Temp:  [97.8 F (36.6 C)-99.3 F (37.4 C)] 98 F (36.7 C) (05/04 1227) Pulse Rate:  [96-123] 105 (05/04 1227) Resp:  [18-29] 27 (05/04 1227) BP: (101-141)/(45-82) 101/50 (05/04 1227) SpO2:  [97 %-100 %] 100 % (05/04 1227)   General appearance: alert, no distress and tracks to voice. Not responding to commands today.  Eyes: conjunctivae/corneas clear. PERRL, EOM's intact. Fundi benign. Resp: clear to auscultation bilaterally Cardio: regular rate and rhythm and S1, S2 normal GI: soft, non-tender; bowel sounds normal; no masses,  no organomegaly Extremities: extremities normal, atraumatic, no cyanosis or edema Skin: Skin color, texture, turgor normal. No rashes or lesions Neurologic: Alert but non-verbal. Non-purposeful  Incision/Wound: Left thigh wound being dressed now. No drainage  Lab Results  Recent Labs  06/14/16 0800 06/14/16  1001  WBC 17.7* 18.6*  HGB 6.4* 6.8*  HCT 21.4* 22.5*  NA 129* 134*  K 2.8* 2.5*  CL 92* 94*  CO2 24 28  BUN 63* 21*  CREATININE 3.27* 1.36*   Liver Panel  Recent Labs  06/12/16 0236 06/14/16 0800  PROT 8.3*  --   ALBUMIN 1.8* 1.4*  AST 48*  --   ALT 36  --   ALKPHOS 167*  --   BILITOT 1.2  --    Sedimentation Rate No results for input(s): ESRSEDRATE in the last 72 hours. C-Reactive Protein No results for input(s): CRP in the last 72 hours.  Microbiology: Recent Results (from the past 240 hour(s))  Culture, blood (Routine X 2) w Reflex to ID Panel     Status: None (Preliminary result)   Collection Time: 06/10/16 12:30 PM  Result  Value Ref Range Status   Specimen Description BLOOD RIGHT HEMODIALYSIS CATHETER  Final   Special Requests   Final    BOTTLES DRAWN AEROBIC AND ANAEROBIC Blood Culture results may not be optimal due to an excessive volume of blood received in culture bottles   Culture NO GROWTH 3 DAYS  Final   Report Status PENDING  Incomplete  Culture, blood (Routine X 2) w Reflex to ID Panel     Status: None (Preliminary result)   Collection Time: 06/10/16 12:45 PM  Result Value Ref Range Status   Specimen Description BLOOD RIGHT URINE, CATHETERIZED  Final   Special Requests   Final    BOTTLES DRAWN AEROBIC AND ANAEROBIC Blood Culture adequate volume   Culture NO GROWTH 3 DAYS  Final   Report Status PENDING  Incomplete  Aerobic/Anaerobic Culture (surgical/deep wound)     Status: Abnormal   Collection Time:   1:04 PM  Result Value Ref Range Status   Specimen Description WOUND  Final   Special Requests LEFT THIGH ABSCESS  Final   Gram Stain NO WBC SEEN FEW GRAM NEGATIVE RODS   Final   Culture (A)  Final    MULTIPLE ORGANISMS PRESENT, NONE PREDOMINANT NO ANAEROBES ISOLATED    Report Status 06/14/2016 FINAL  Final    Studies/Results: Dg Abd Portable 1v  Result Date: 06/13/2016 CLINICAL DATA:  Nasogastric tube placement. EXAM: PORTABLE ABDOMEN - 1 VIEW COMPARISON:  06/10/2016. FINDINGS: Feeding tube extending into the mid stomach and curving back upon itself with its tip in the proximal stomach. Normal bowel gas pattern. Arterial calcifications. No acute bony abnormality. IMPRESSION: Feeding tube tip in the proximal stomach. Electronically Signed   By: Beckie Salts M.D.   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 This most probably  represents vegetation -Await TEE - re-scheduled 5/7  Left Thigh Wound -s/p debriedment 05/21/16 (polymicrobial) -5/1 Debridement >>few GNR, polymicrobial w/o anaerobes present  Reactive RPR 1:1 - HIV (-) -T Pallidum Abs negative - Followedup with state health dept, will treat as false positive.   Severe, protein calorie malnutrition -nepro TF with prostat started 5/1  Diarrhea  Total days of antibiotics: 6vanco/zosyn --> Vanc/Cefepime (5/1)          Rexene Alberts Infectious Diseases (pager) [phone] [email or web address] 06/14/2016, 1:38 PM  LOS: 6 days

## 2016-06-14 NOTE — Progress Notes (Addendum)
Palliative Care Family Meeting  I met with [name]'s husband, sister, brother and mother to update them on her condition and to clarify goals of care. [name] has continued to decline in terms of her mental status and condition. She has severe apathy and is no longer able to communicate or consistently follow commands. She continues to tolerate and receive hemodialysis- limiting factor and major issue here is her change in mental status. She has been hospitalized since 4/27 admitted after only 5 days at [facility] and is on her 5th hospitalization in the last 6 months. Prior to 40 months ago she was a functioning person and mother to her 92 year old, 66 year old and 40 year old who are at home with her husband (childrens father).   This family had little or no understanding of the severity of her current condition-they were shocked and continued to ask me when she would return home. Very low health literacy a major challenge-especially in explaining the complexity of her condition and how it got this bad-they truly felt she was doing well- so my discussion with them today will need to settle before we can make and decisions. They are definitely not in a place where they would even be open to discussing a comfort care transition or de-escalation on interventions. Given 41 young age, her reported functional status about two months ago it may be reasonable to give give this more time- the family will need this in general and are asking for every possible or reasonable medical intervention be done-they simply cannot accept that the outcome here is most likely going to be very bad and that she may not "make it". They became extremely emotional and tearful when I told them this may actually be [name]'s new baseline existence-they are adamant that "we have to get her better" because she "has young kids to take care of". Her husband is overwhelmed but he definitely heard me today about how serious this is- his  40 yo is having emotional problems without her mom and he has no resources for childcare for his youngest children. He himself is having a hard time functioning without her.  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 I believe this can explain very clearly her mental status-given location of stroke-subclinical seizure a concern as well as severe depression (her SSRIs were held on admission). Since her mental status problems have persisted may be reasonable to have neurology weigh in again and consider worsening of her stroke or additional strokes.   Continue Full Scope Treatment  Full Code  Consider Neurology re-consult AV:[id number], need for repeat imaging or EEG? Rehab potential? Projected neurological outcomes?  Family do want PEG and artifical nutrition to be started.  Medication Reconciliation needs to be done-she has a Cortrak so meds can be given per tube- I restarted her SSRI, this had been stopped at some point- will use fluoxitine since it is the most activating SSRI available. Would like to try a stimulant such as provigil or ritalin but need to make sure we have good control over her blood pressure and that from a cardiac standpoint she is clear for this.   Clonidine can be very sedating and I would consider changing this medication to a per tube agent for BP control.  Consider having LTACHs evaluate her as a potential patient- she is going to have a difficult  course and a long road ahead. She cannot dialyze outpatient in her current condition.  I started her on scheduled Tylenol for pain-hx of chronic back pain  Will request CSW send a referral to [place] for counseling for her 77,51 and 40 year old.  This family will need ongoing discussions and education about her condition.   Time: 60 minutes  Lane Hacker, [place]

## 2016-06-14 NOTE — Progress Notes (Signed)
INFECTIOUS DISEASE PROGRESS NOTE  ID: [name] is a 41 y.o. female with  Principal Problem:   CVA (cerebral vascular accident) (HCC) Active Problems:   Morbid obesity (HCC)   Essential hypertension   Acute metabolic encephalopathy   Uncontrolled diabetes mellitus with diabetic nephropathy, with long-term current use of insulin (HCC)   ESRD (end stage renal disease) (HCC)   Depression due to physical illness   Wound abscess  Subjective: No response  Abtx:  Anti-infectives    Start     Dose/Rate Route Frequency Ordered Stop   06/14/16 0931  vancomycin (VANCOCIN) 1-5 GM/200ML-% IVPB    Comments:  [name], [name]   : cabinet override      06/14/16 0931 06/14/16 1113   06/12/16 1700  ceFEPIme (MAXIPIME) 1 g in dextrose 5 % 50 mL IVPB     1 g 100 mL/hr over 30 Minutes Intravenous Every 24 hours 06/12/16 1648     06/12/16 0959  vancomycin (VANCOCIN) 1-5 GM/200ML-% IVPB    Comments:  [name], [name]   : cabinet override      06/12/16 0959 06/12/16 1015   06/10/16 1200  vancomycin (VANCOCIN) IVPB 1000 mg/200 mL premix     1,000 mg 200 mL/hr over 60 Minutes Intravenous Every M-W-F (Hemodialysis) 06/10/16 0851     06/10/16 1000  piperacillin-tazobactam (ZOSYN) IVPB 3.375 g  Status:  Discontinued     3.375 g 12.5 mL/hr over 240 Minutes Intravenous Every 12 hours 06/10/16 0851 06/12/16 1629   06/10/16 1000  vancomycin (VANCOCIN) 2,250 mg in sodium chloride 0.9 % 500 mL IVPB  Status:  Discontinued     2,250 mg 250 mL/hr over 120 Minutes Intravenous Every 12 hours 06/10/16 0851 06/10/16 0858   06/10/16 1000  vancomycin (VANCOCIN) 2,250 mg in sodium chloride 0.9 % 500 mL IVPB     2,250 mg 250 mL/hr over 120 Minutes Intravenous  Once 06/10/16 0858 06/10/16 1640      Medications:  Scheduled: . acetaminophen  650 mg Per Tube TID  . aspirin  300 mg Rectal Daily  . chlorhexidine  15 mL Mouth Rinse BID  . cloNIDine  0.3 mg Transdermal Weekly  . feeding supplement (NEPRO  CARB STEADY)  1,000 mL Oral Q24H  . feeding supplement (PRO-STAT SUGAR FREE 64)  30 mL Per Tube TID  . FLUoxetine  20 mg Per Tube Daily  . insulin aspart  0-9 Units Subcutaneous Q4H  . mouth rinse  15 mL Mouth Rinse q12n4p  . metoprolol  5 mg Intravenous Q8H  . pneumococcal 23 valent vaccine  0.5 mL Intramuscular Tomorrow-1000  . Zinc Oxide   Topical BID    Objective: Vital signs in last 24 hours: Temp:  [97.8 F (36.6 C)-99.3 F (37.4 C)] 98 F (36.7 C) (05/04 1227) Pulse Rate:  [101-123] 110 (05/04 1420) Resp:  [18-29] 25 (05/04 1420) BP: (101-154)/(45-89) 154/89 (05/04 1420) SpO2:  [97 %-100 %] 99 % (05/04 1420)   General appearance: no distress Resp: clear to auscultation bilaterally Chest wall: HD line is clean Cardio: tachycardia GI: normal findings: bowel sounds normal and soft, non-tender Extremities: edema LUE AVF crusted.   Lab Results  Recent Labs  06/14/16 0800 06/14/16 1001  WBC 17.7* 18.6*  HGB 6.4* 6.8*  HCT 21.4* 22.5*  NA 129* 134*  K 2.8* 2.5*  CL 92* 94*  CO2 24 28  BUN 63* 21*  CREATININE 3.27* 1.36*   Liver Panel  Recent Labs  06/12/16 0236 06/14/16  0800  PROT 8.3*  --   ALBUMIN 1.8* 1.4*  AST 48*  --   ALT 36  --   ALKPHOS 167*  --   BILITOT 1.2  --    Sedimentation Rate No results for input(s): ESRSEDRATE in the last 72 hours. C-Reactive Protein No results for input(s): CRP in the last 72 hours.  Microbiology: Recent Results (from the past 240 hour(s))  Culture, blood (Routine X 2) w Reflex to ID Panel     Status: None (Preliminary result)   Collection Time: 06/10/16 12:30 PM  Result Value Ref Range Status   Specimen Description BLOOD RIGHT HEMODIALYSIS CATHETER  Final   Special Requests   Final    BOTTLES DRAWN AEROBIC AND ANAEROBIC Blood Culture results may not be optimal due to an excessive volume of blood received in culture bottles   Culture NO GROWTH 4 DAYS  Final   Report Status PENDING  Incomplete  Culture, blood  (Routine X 2) w Reflex to ID Panel     Status: None (Preliminary result)   Collection Time: 06/10/16 12:45 PM  Result Value Ref Range Status   Specimen Description BLOOD RIGHT URINE, CATHETERIZED  Final   Special Requests   Final    BOTTLES DRAWN AEROBIC AND ANAEROBIC Blood Culture adequate volume   Culture NO GROWTH 4 DAYS  Final   Report Status PENDING  Incomplete  Aerobic/Anaerobic Culture (surgical/deep wound)     Status: Abnormal   Collection Time: 06/29/2016  1:04 PM  Result Value Ref Range Status   Specimen Description WOUND  Final   Special Requests LEFT THIGH ABSCESS  Final   Gram Stain NO WBC SEEN FEW GRAM NEGATIVE RODS   Final   Culture (A)  Final    MULTIPLE ORGANISMS PRESENT, NONE PREDOMINANT NO ANAEROBES ISOLATED    Report Status 06/14/2016 FINAL  Final    Studies/Results: Dg Abd Portable 1v  Result Date: 06/13/2016 CLINICAL DATA:  Nasogastric tube placement. EXAM: PORTABLE ABDOMEN - 1 VIEW COMPARISON:  06/10/2016. FINDINGS: Feeding tube extending into the mid stomach and curving back upon itself with its tip in the proximal stomach. Normal bowel gas pattern. Arterial calcifications. No acute bony abnormality. IMPRESSION: Feeding tube tip in the proximal stomach. Electronically Signed   By: Beckie Salts M.D.   On: 06/13/2016 20:02     Assessment/Plan: ESRD - HD yesterday via Right AVF    -Can HD line be removed?  IE of Ao -recent acute multifocal CVAs and fever -BCx 4/30 >>NGTD -Continue Vanco/Cefepime while awaiting cultures -Await TEE - scheduled 5/7  Left Thigh Wound -s/p debriedment 05/21/16 (poly microbial) -5/1 Debridement- polymicrobial would continue broad coverage  CVA with multiple infarcts (old and new)  false positive RPR   Severe, protein calorie malnutrition -nepro TF with prostat started 5/1  Diarrhea  Appreciate Palliative care eval.  I agree her prognosis is poor  Total days of antibiotics: 6vanco/zosyn -->  Vanc/Cefepime (5/1)         Johny Sax Infectious Diseases (pager) [phone] [email or web address] 06/14/2016, 4:08 PM  LOS: 6 days

## 2016-06-15 DIAGNOSIS — I6309 Cerebral infarction due to thrombosis of other precerebral artery: Secondary | ICD-10-CM

## 2016-06-15 LAB — TYPE AND SCREEN
ABO/RH(D): A POS
ANTIBODY SCREEN: NEGATIVE
Unit division: 0
Unit division: 0

## 2016-06-15 LAB — GLUCOSE, CAPILLARY
GLUCOSE-CAPILLARY: 314 mg/dL — AB (ref 65–99)
GLUCOSE-CAPILLARY: 330 mg/dL — AB (ref 65–99)
Glucose-Capillary: 364 mg/dL — ABNORMAL HIGH (ref 65–99)
Glucose-Capillary: 365 mg/dL — ABNORMAL HIGH (ref 65–99)
Glucose-Capillary: 324 mg/dL — ABNORMAL HIGH (ref 65–99)
Glucose-Capillary: 362 mg/dL — ABNORMAL HIGH (ref 65–99)

## 2016-06-15 LAB — BPAM RBC
BLOOD PRODUCT EXPIRATION DATE: 201805072359
Blood Product Expiration Date: 201805082359
ISSUE DATE / TIME: 201805041120
ISSUE DATE / TIME: 201805041120
UNIT TYPE AND RH: 6200
UNIT TYPE AND RH: 6200

## 2016-06-15 LAB — CULTURE, BLOOD (ROUTINE X 2)
CULTURE: NO GROWTH
CULTURE: NO GROWTH
SPECIAL REQUESTS: ADEQUATE

## 2016-06-15 MED ORDER — INSULIN GLARGINE 100 UNIT/ML ~~LOC~~ SOLN
10.0000 [IU] | Freq: Every day | SUBCUTANEOUS | Status: DC
  Administered 2016-06-15: 10 [IU] via SUBCUTANEOUS
  Filled 2016-06-15: qty 0.1

## 2016-06-15 MED ORDER — HYDRALAZINE HCL 20 MG/ML IJ SOLN: 10.0000 mg | INTRAMUSCULAR | Status: DC | PRN

## 2016-06-15 MED ORDER — ASPIRIN 325 MG PO TABS
325.0000 mg | ORAL_TABLET | Freq: Every day | ORAL | Status: DC
  Administered 2016-06-15 – 2016-06-17 (×3): 325 mg via ORAL
  Filled 2016-06-15 (×3): qty 1

## 2016-06-15 MED ORDER — METOPROLOL TARTRATE 5 MG/5ML IV SOLN
5.0000 mg | INTRAVENOUS | Status: DC | PRN
  Administered 2016-07-12: 5 mg via INTRAVENOUS

## 2016-06-15 MED ORDER — METOPROLOL TARTRATE 12.5 MG HALF TABLET
12.5000 mg | ORAL_TABLET | Freq: Two times a day (BID) | ORAL | Status: DC
  Administered 2016-06-15 – 2016-06-16 (×3): 12.5 mg via ORAL
  Filled 2016-06-15 (×3): qty 1

## 2016-06-15 NOTE — Plan of Care (Signed)
Problem: Education: Goal: Knowledge of [facility] Education information/materials will improve Outcome: Not Progressing Pt unable to learm no family at the bedside

## 2016-06-15 NOTE — Plan of Care (Signed)
Problem: Nutrition: Goal: Dietary intake will improve Outcome: Not Met (add Reason) Pt is tube fed

## 2016-06-15 NOTE — Progress Notes (Signed)
[facility] Progress Note   Subjective:  Unresponsive to verbal stimuli. Open open, does not track or accommodate objects.  Objective Vitals:   06/15/16 0800 06/15/16 0900 06/15/16 1100 06/15/16 1200  BP: 134/76 121/73 (!) 144/74 128/87  Pulse: (!) 109   (!) 110  Resp: (!) 25 19  (!) 24  Temp:   97.2 F (36.2 C)   TempSrc:   Axillary   SpO2: 96% 98%  96%  Weight:      Height:       Physical Exam General: Eyes open, not following commands Heart: S1,S2, RRR Lungs: CTAB A/P Abdomen: active BS FT in place. Flexaseal rectal tube with brown liquid stool.  Extremities: Wound vac L thigh. No edema RLE.  Dialysis Access: RIJ TDC, LUA AVG + bruit   Additional Objective Labs: Basic Metabolic Panel:  Recent Labs Lab 06/10/16 1300  06/12/16 0236 06/12/16 0532 06/14/16 0800 06/14/16 1001  NA 139  < > 137  --  129* 134*  K 3.2*  < > 5.4*  --  2.8* 2.5*  CL 100*  < > 97*  --  92* 94*  CO2 26  < > 24  --  24 28  GLUCOSE 123*  < > 272*  --  311* 172*  BUN 30*  < > 39*  --  63* 21*  CREATININE 3.15*  < > 3.62*  --  3.27* 1.36*  CALCIUM 8.9  < > 9.3  --  8.8* 8.0*  PHOS 2.9  --   --  5.1* 2.6  --   < > = values in this interval not displayed. Liver Function Tests:  Recent Labs Lab 06/09/16 0258  06/18/2016 0230 06/12/16 0236 06/14/16 0800  AST 17  --  18 48*  --   ALT 25  --  22 36  --   ALKPHOS 101  --  116 167*  --   BILITOT 0.8  --  1.9* 1.2  --   PROT 8.0  --  7.9 8.3*  --   ALBUMIN 1.8*  < > 1.7* 1.8* 1.4*  < > = values in this interval not displayed. No results for input(s): LIPASE, AMYLASE in the last 168 hours. CBC:  Recent Labs Lab 06/10/16 1349 06/25/2016 0230 06/12/16 0236 06/14/16 0800 06/14/16 1001  WBC 17.7* 18.0* 12.4* 17.7* 18.6*  HGB 7.9* 7.9* 9.0* 6.4* 6.8*  HCT 26.2* 26.7* 30.2* 21.4* 22.5*  MCV 89.7 91.1 91.0 89.2 88.9  PLT 323 280 PLATELET CLUMPS NOTED ON SMEAR, COUNT APPEARS ADEQUATE 238 229   Blood Culture    Component  Value Date/Time   SDES WOUND 06/27/2016 1304   SPECREQUEST LEFT THIGH ABSCESS 06/20/2016 1304   CULT (A) 06/19/2016 1304    MULTIPLE ORGANISMS PRESENT, NONE PREDOMINANT NO ANAEROBES ISOLATED    REPTSTATUS 06/14/2016 FINAL 06/12/2016 1304    Cardiac Enzymes: No results for input(s): CKTOTAL, CKMB, CKMBINDEX, TROPONINI in the last 168 hours. CBG:  Recent Labs Lab 06/14/16 1926 06/14/16 2319 06/15/16 0348 06/15/16 0749 06/15/16 1144  GLUCAP 216* 311* 314* 364* 365*   Iron Studies: No results for input(s): IRON, TIBC, TRANSFERRIN, FERRITIN in the last 72 hours. '@[email or web address]'$ @ Studies/Results: Dg Abd Portable 1v  Result Date: 06/13/2016 CLINICAL DATA:  Nasogastric tube placement. EXAM: PORTABLE ABDOMEN - 1 VIEW COMPARISON:  06/10/2016. FINDINGS: Feeding tube extending into the mid stomach and curving back upon itself with its tip in the proximal stomach. Normal bowel gas pattern. Arterial calcifications. No acute bony abnormality.  IMPRESSION: Feeding tube tip in the proximal stomach. Electronically Signed   By: Claudie Revering M.D.   On: 06/13/2016 20:02   Medications: . sodium chloride    . ceFEPime (MAXIPIME) IV Stopped (06/13/16 2142)  . vancomycin Stopped (06/14/16 1212)   . acetaminophen  650 mg Per Tube TID  . aspirin  325 mg Oral Daily  . feeding supplement (NEPRO CARB STEADY)  1,000 mL Oral Q24H  . feeding supplement (PRO-STAT SUGAR FREE 64)  30 mL Per Tube TID  . FLUoxetine  20 mg Per Tube Daily  . insulin aspart  0-9 Units Subcutaneous Q4H  . insulin glargine  10 Units Subcutaneous QHS  . mouth rinse  15 mL Mouth Rinse q12n4p  . metoprolol tartrate  12.5 mg Oral BID  . pneumococcal 23 valent vaccine  0.5 mL Intramuscular Tomorrow-1000  . Zinc Oxide   Topical BID   Dialysis:  [facility]  4:30 hours, 400/800  105.5kg   3K/2Ca   TDC R IJ/ maturing LUA AVG  Hep 4400 - Mircera 153mg IV q 2 weeks (last given 4/25) - Calcitriol 0.253m PO q  HD   Assessment/Plan: 1. Acute multifocal CVAs:Plan per neuro and intensivist 2. AMS: poorly responsive, CVA +/- infection 3. L thigh soft-tissue abscess/ infection: sp I&D 4/10 and repeat I&D 5/2 yesterday by gen surg. VAC in place, IV abx vanc/ zosyn.   4. Abnormal ECHO: Possible vegetation on L coronary cusp, could be cause of multifocal CVA. TEE planned. 1/6 +bcx's for CNSS was felt to be contaminant most likely per ID.   5. ESRD: MWF HD. Using L arm AVG, IR to remove HD cath today after HD however, cath is still in place. Next HD 07/09/2016 6. HTN: home coreg on hold, getting q 8hr IV metoprolol. Will start catapres patch #3 7. Volume: 3kg under dry wt, no excess on exam 8. Anemia: Hgb 6.4 today, repeating and ordered 2u prbc's. Just got mircera 150 ug on 4/25. Not candidate for IV Fe with infection/ ^^ferritin. Last tsat 4/19 not calc due to low transferrin. Ferritin 450 > 1000> 3100 over last 3 mos.   9. Metabolic bone disease: CorrCa high, Phos ok. Holding VDRA/ binders for now 10. Nutrition: Alb very low, getting Cortrak TF's. 11. Disposition: Family met with palliative care yesterday. Patient remains a full code. Family has little to no understanding of patient's current condition, believes she will get better. Now plans are in place for peg placement. Will need LTACH with HD facilities.   Rita H. Brown NP-C 06/15/2016, 1:26 PM  [facility] [phone] seen, examined and agree w A/P as above.  RoKelly SplinterD Ca[employer]ell Rubbermaidager [phone] 06/15/2016, 1:59 PM

## 2016-06-15 NOTE — Progress Notes (Signed)
[place] TEAM 1 - Stepdown/ICU TEAM  [name]  ZOX:[id number] DOB: 1975-09-08 DOA: 06/01/2016 PCP: Marletta Lor, NP    Brief Narrative:  41 y.o.woman with a history of CVA, DM2, morbid obesity, chronic lymphedema, HTN, chronic diastolic heart failure, depression, and admission 3/21 > 4/20 for acute encephalopathy, acute renal failure on CKD 5 requiring HD, decompensated heart failure, and infected leg ulcer.  She was discharged to a SNF.  She had been dialyzing, presumably on MWF.  Upon completion of her HD session on the day of presentation, she was noted to have mental status changes.  It was not clear to the personnel when she was last known normal.  She was transferred to the ED where she had a WBC count of 20, Hgb 9, Ammonia level normal.  Head CT was negative for acute process.  MRI/MRA noted acute right deep gray nuclei and right hippocampal infarcts with an occluded R MCA, Right ICA, and Left vertebral artery.  Subjective:  Doesn't communicate. No acute events overnight.   Assessment & Plan:  Acute metabolic encephalopathy in setting of chronic encephalopathy During last admit mental status apparently waxed and waned - Psych saw in consultation and suggested tx for depression  - suspect her acute change is due to TME in setting of sepsis related to her L thigh wound and possible SBE -Cont vanc and Cefepime now  -Discontinue clonidine due to sedative effects. Will use other antihypertensive agents - cont supportive care  -We'll consider getting an EEG after her TEE on Monday to evaluate her brain function. Overall she does have poor prognosis and I'm not sure how much she will recover -Appreciate palliative care input  L posterior/medial thigh wound s/p debridement by Len Childs 05/21/16 - completed a course of abx tx on 05/31/16  - CT of the thigh has noted a new area c/w an abscess not communicating with the currently opened area  - Gen Surgery- Excision of skin and  subcutaneous tissue of left thigh (25 x 8 x 3 cm) on 5/1 - broad abx coverage to include MRSA coverage to continue   SBE - Possible 7x4mm vegetation L coronary cusp  Blood cx during last admit 1 of 6 + for coag neg Staph strongly suggestive of contaminant - no such mass noted on TTE 03/22/16  -Cardiology consulted for TEE. Pending eval. Checked EMR, TEE rescheduled for 5/7  Acute, multifocal CVAs with history of multiple prior CVAs Concerns for cardiac lesion as seen on echocardiogram which could likely be the source of her stroke She will need TEE - rescheduled for 5/7  ESRD on HD Nephrology following - for HD M/W/F  Need to remove TLC. Graft functioning well   Anemia likely from chronic disease  -no signs of acute blood loss -Will order 2 units PRBC (5/4)  Reactive RPR Quant f/u RPR 1:1 w/ negative T pallidum abs - ?false negative - ID following   Morbid obesity - Body mass index is 41.41 kg/m.   Nutrition  Cortrak for nutrition. Family okay with PEG. We can try and schedule her for Monday or Tuesday after her TEE.  HTN -We'll discontinue her clonidine at this time due to possible sedative effects -Start metoprolol 12.5 orally twice a day. Lopressor 5 mg IV and hydralazine 5 mg IV when necessary  DM2 CBG elevated, didn't get her lantus yesterday. Will start it today.  Premeal coverage this afternoon. Resistant ISS.   Chronic diastolic congestive heart failure TTE 4/28 noted EF  55-60% w/ grade 1 DD - EDW per HD center is 105.5kg  Filed Weights   06/08/16 0421 06/09/16 0500  Weight: 105.3 kg (232 lb 2.3 oz) 102.7 kg (226 lb 6.6 oz)   Palliative care team consulted.   DVT prophylaxis: SCDs Code Status: FULL CODE Family Communication: no family present at time of exam.  Disposition Plan: SDU  Consultants:  Neurology  Gen Surgery Nephrology   Procedures: none  Antimicrobials:  Zosyn 4/30 >5/2 Vanc 4/30 > Cefepime 5/2 >  Objective: Blood pressure 121/73,  pulse (!) 109, temperature 97.2 F (36.2 C), temperature source Axillary, resp. rate 19, height 5\' 2"  (1.575 m), weight 102.7 kg (226 lb 6.6 oz), SpO2 98 %.  Intake/Output Summary (Last 24 hours) at 06/15/16 1201 Last data filed at 06/15/16 1000  Gross per 24 hour  Intake           970.67 ml  Output              415 ml  Net           555.67 ml   Filed Weights   06/08/16 0421 06/09/16 0500  Weight: 105.3 kg (232 lb 2.3 oz) 102.7 kg (226 lb 6.6 oz)    Examination: General: No acute respiratory distress - obtunded  Lungs: Clear to auscultation bilaterally without wheeze - distant BS related to body habitus  Cardiovascular: Regular rate and rhythm without murmur - distant HS  Abdomen: Obese, soft, bowel sounds present, no organomegaly, no rebound - Cortrak in L nare Extremities: L medial thigh wound dressed and presently dry - change of chronic venous statis dermatitis noted B Neurologic: Obtunded, no Babinski - does not withdraw from painful stimuli - opens eyes intermittently but does not follow examiner    CBC:  Recent Labs Lab 06/10/16 1349 07/10/2016 0230 06/12/16 0236 06/14/16 0800 06/14/16 1001  WBC 17.7* 18.0* 12.4* 17.7* 18.6*  HGB 7.9* 7.9* 9.0* 6.4* 6.8*  HCT 26.2* 26.7* 30.2* 21.4* 22.5*  MCV 89.7 91.1 91.0 89.2 88.9  PLT 323 280 PLATELET CLUMPS NOTED ON SMEAR, COUNT APPEARS ADEQUATE 238 229   Basic Metabolic Panel:  Recent Labs Lab 06/09/16 0258 06/10/16 1300 07/11/2016 0230 06/12/16 0236 06/12/16 0532 06/14/16 0800 06/14/16 1001  NA 137 139 137 137  --  129* 134*  K 3.8 3.2* 4.5 5.4*  --  2.8* 2.5*  CL 97* 100* 98* 97*  --  92* 94*  CO2 25 26 23 24   --  24 28  GLUCOSE 157* 123* 140* 272*  --  311* 172*  BUN 37* 30* 21* 39*  --  63* 21*  CREATININE 3.99* 3.15* 2.70* 3.62*  --  3.27* 1.36*  CALCIUM 9.5 8.9 8.8* 9.3  --  8.8* 8.0*  MG 2.2  --   --   --   --   --   --   PHOS 5.0* 2.9  --   --  5.1* 2.6  --    GFR: Estimated Creatinine Clearance: 62.3  mL/min (A) (by C-G formula based on SCr of 1.36 mg/dL (H)).  Liver Function Tests:  Recent Labs Lab 06/09/16 0258 06/10/16 1300 06/17/2016 0230 06/12/16 0236 06/14/16 0800  AST 17  --  18 48*  --   ALT 25  --  22 36  --   ALKPHOS 101  --  116 167*  --   BILITOT 0.8  --  1.9* 1.2  --   PROT 8.0  --  7.9 8.3*  --  ALBUMIN 1.8* 1.7* 1.7* 1.8* 1.4*   No results for input(s): AMMONIA in the last 168 hours.  HbA1C: Hgb A1c MFr Bld  Date/Time Value Ref Range Status  06/09/2016 02:58 AM 6.7 (H) 4.8 - 5.6 % Final    Comment:    (NOTE)         Pre-diabetes: 5.7 - 6.4         Diabetes: >6.4         Glycemic control for adults with diabetes: <7.0   06/08/2016 04:48 AM 6.6 (H) 4.8 - 5.6 % Final    Comment:    (NOTE)         Pre-diabetes: 5.7 - 6.4         Diabetes: >6.4         Glycemic control for adults with diabetes: <7.0     CBG:  Recent Labs Lab 06/14/16 1926 06/14/16 2319 06/15/16 0348 06/15/16 0749 06/15/16 1144  GLUCAP 216* 311* 314* 364* 365*    Scheduled Meds: . acetaminophen  650 mg Per Tube TID  . aspirin  325 mg Oral Daily  . cloNIDine  0.3 mg Transdermal Weekly  . feeding supplement (NEPRO CARB STEADY)  1,000 mL Oral Q24H  . feeding supplement (PRO-STAT SUGAR FREE 64)  30 mL Per Tube TID  . FLUoxetine  20 mg Per Tube Daily  . insulin aspart  0-9 Units Subcutaneous Q4H  . mouth rinse  15 mL Mouth Rinse q12n4p  . metoprolol  5 mg Intravenous Q8H  . pneumococcal 23 valent vaccine  0.5 mL Intramuscular Tomorrow-1000  . Zinc Oxide   Topical BID     LOS: 7 days    Stephania FragminAnkit Yajahira Tison, MD [facility] Office  [phone] Pager - Text Page per Loretha StaplerAmion as per below:  On-Call/Text Page:      [email or web address]      password [email or web address]  If 7PM-7AM, please contact night-coverage [email or web address] Password [email or web address] 06/15/2016, 12:01 PM

## 2016-06-16 LAB — MAGNESIUM: MAGNESIUM: 2.2 mg/dL (ref 1.7–2.4)

## 2016-06-16 LAB — BASIC METABOLIC PANEL
Anion gap: 15 (ref 5–15)
BUN: 62 mg/dL — ABNORMAL HIGH (ref 6–20)
CALCIUM: 9 mg/dL (ref 8.9–10.3)
CO2: 26 mmol/L (ref 22–32)
Chloride: 91 mmol/L — ABNORMAL LOW (ref 101–111)
Creatinine, Ser: 2.87 mg/dL — ABNORMAL HIGH (ref 0.44–1.00)
GFR calc non Af Amer: 20 mL/min — ABNORMAL LOW (ref >60)
GFR, EST AFRICAN AMERICAN: 23 mL/min — AB (ref >60)
Glucose, Bld: 275 mg/dL — ABNORMAL HIGH (ref 65–99)
Potassium: 3.1 mmol/L — ABNORMAL LOW (ref 3.5–5.1)
SODIUM: 132 mmol/L — AB (ref 135–145)

## 2016-06-16 LAB — CBC
HCT: 30.9 % — ABNORMAL LOW (ref 36.0–46.0)
HEMOGLOBIN: 9.5 g/dL — AB (ref 12.0–15.0)
MCH: 26.6 pg (ref 26.0–34.0)
MCHC: 30.7 g/dL (ref 30.0–36.0)
MCV: 86.6 fL (ref 78.0–100.0)
Platelets: 244 10*3/uL (ref 150–400)
RBC: 3.57 MIL/uL — AB (ref 3.87–5.11)
RDW: 19.8 % — ABNORMAL HIGH (ref 11.5–15.5)
WBC: 16.6 10*3/uL — AB (ref 4.0–10.5)

## 2016-06-16 LAB — GLUCOSE, CAPILLARY: Glucose-Capillary: 246 mg/dL — ABNORMAL HIGH (ref 65–99)

## 2016-06-16 MED ORDER — INSULIN GLARGINE 100 UNIT/ML ~~LOC~~ SOLN
20.0000 [IU] | Freq: Every day | SUBCUTANEOUS | Status: DC
  Administered 2016-06-16: 20 [IU] via SUBCUTANEOUS
  Filled 2016-06-16: qty 0.2

## 2016-06-16 MED ORDER — POTASSIUM CHLORIDE 20 MEQ/15ML (10%) PO SOLN
40.0000 meq | Freq: Two times a day (BID) | ORAL | Status: AC
  Administered 2016-06-16 (×2): 40 meq via ORAL
  Filled 2016-06-16 (×2): qty 30

## 2016-06-16 MED ORDER — METOPROLOL TARTRATE 25 MG PO TABS
25.0000 mg | ORAL_TABLET | Freq: Two times a day (BID) | ORAL | Status: DC
  Administered 2016-06-16 – 2016-06-17 (×2): 25 mg via ORAL
  Filled 2016-06-16 (×2): qty 1

## 2016-06-16 NOTE — Clinical Social Work Note (Addendum)
Per Nephrology last note pt will need LTACH with HD facilities. Plans are in place for peg placement. CSW following for disposition needs.   [place] CityBridget Orvilla Truett, ConnecticutLCSWA [phone]

## 2016-06-16 NOTE — Plan of Care (Signed)
Problem: Pain Managment: Goal: General experience of comfort will improve Outcome: Progressing Cpot continues to be 0

## 2016-06-16 NOTE — Progress Notes (Signed)
[place] TEAM 1 - Stepdown/ICU TEAM  [name]  ONG:[id number] DOB: 04/21/1975 DOA: 2016/06/19 PCP: Marletta Lor, NP    Brief Narrative:  41 y.o.woman with a history of CVA, DM2, morbid obesity, chronic lymphedema, HTN, chronic diastolic heart failure, depression, and admission 3/21 > 4/20 for acute encephalopathy, acute renal failure on CKD 5 requiring HD, decompensated heart failure, and infected leg ulcer.  She was discharged to a SNF.  She had been dialyzing, presumably on MWF.  Upon completion of her HD session on the day of presentation, she was noted to have mental status changes.  It was not clear to the personnel when she was last known normal.  She was transferred to the ED where she had a WBC count of 20, Hgb 9, Ammonia level normal.  Head CT was negative for acute process.  MRI/MRA noted acute right deep gray nuclei and right hippocampal infarcts with an occluded R MCA, Right ICA, and Left vertebral artery.  Subjective:  Doesn't communicate. Sometimes opens her eyes and traces but very inconsistent.   Assessment & Plan:  Acute metabolic encephalopathy in setting of chronic encephalopathy During last admit mental status apparently waxed and waned - Psych saw in consultation and suggested tx for depression  - suspect her acute change is due to TME in setting of sepsis related to her L thigh wound and possible SBE -Cont vanc and Cefepime now  -Discontinue clonidine due to sedative effects. Will use other antihypertensive agents - cont supportive care  -We'll consider getting an EEG after her TEE on Monday to evaluate her brain function. Overall she does have poor prognosis and I'm not sure how much she will recover -Appreciate palliative care input  L posterior/medial thigh wound s/p debridement by Len Childs 05/21/16 - completed a course of abx tx on 05/31/16  - CT of the thigh has noted a new area c/w an abscess not communicating with the currently opened area  - Gen  Surgery- Excision of skin and subcutaneous tissue of left thigh (25 x 8 x 3 cm) on 5/1 - broad abx coverage to include MRSA coverage to continue   SBE - Possible 7x72mm vegetation L coronary cusp  -Blood cx during last admit 1 of 6 + for coag neg Staph strongly suggestive of contaminant  - no such mass noted on TTE 03/22/16  -Cardiology consulted for TEE. Pending eval. Checked EMR, TEE rescheduled for 5/7  Acute, multifocal CVAs with history of multiple prior CVAs -Concerns for cardiac lesion as seen on echocardiogram which could likely be the source of her stroke -She will need TEE - rescheduled for 5/7  ESRD on HD -Nephrology following - for HD M/W/F  -IR to remove HD catheter. Graft functioning well   Anemia likely from chronic disease  -no signs of acute blood loss - 2 units PRBC (5/4)  Reactive RPR Quant f/u RPR 1:1 w/ negative T pallidum abs - ?false negative - ID following   Morbid obesity - Body mass index is 41.41 kg/m.   Nutrition  Cortrak for nutrition. Family okay with PEG. We can try and schedule her for Monday after her TEE or Tuesday.  HTN -We'll discontinue her clonidine at this time due to possible sedative effects -Metoprolol increased to 25mg  po bid 5/6. Lopressor 5 mg IV and hydralazine 5 mg IV when necessary  DM2 CBG elevated, didn't get her lantus yesterday. Lantus increased to 20 units on 5/6 Premeal coverage this afternoon. Resistant ISS.   Chronic  diastolic congestive heart failure TTE 4/28 noted EF 55-60% w/ grade 1 DD - EDW per HD center is 105.5kg  Filed Weights   06/09/16 0500  Weight: 102.7 kg (226 lb 6.6 oz)   Palliative care team following. Appreciate input.   DVT prophylaxis: SCDs Code Status: FULL CODE Family Communication: no family present at time of exam.  Disposition Plan: SDU  Consultants:  Neurology  Gen Surgery Nephrology   Procedures: none  Antimicrobials:  Zosyn 4/30 >5/2 Vanc 4/30 > Cefepime 5/2  >  Objective: Blood pressure 120/63, pulse (!) 107, temperature 98.3 F (36.8 C), temperature source Axillary, resp. rate 20, height 5\' 2"  (1.575 m), weight 102.7 kg (226 lb 6.6 oz), SpO2 100 %.  Intake/Output Summary (Last 24 hours) at 06/16/16 1134 Last data filed at 06/16/16 0800  Gross per 24 hour  Intake              980 ml  Output                0 ml  Net              980 ml   Filed Weights   06/09/16 0500  Weight: 102.7 kg (226 lb 6.6 oz)    Examination: General: No acute respiratory distress - obtunded  Lungs: Clear to auscultation bilaterally without wheeze - distant BS related to body habitus  Cardiovascular: Regular rate and rhythm without murmur - distant HS  Abdomen: Obese, soft, bowel sounds present, no organomegaly, no rebound - Cortrak in L nare Extremities: L medial thigh wound dressed and presently dry - change of chronic venous statis dermatitis noted B Neurologic: Obtunded, no Babinski - does not withdraw from painful stimuli - opens eyes intermittently but does not follow examiner    CBC:  Recent Labs Lab 07/09/2016 0230 06/12/16 0236 06/14/16 0800 06/14/16 1001 06/16/16 0226  WBC 18.0* 12.4* 17.7* 18.6* 16.6*  HGB 7.9* 9.0* 6.4* 6.8* 9.5*  HCT 26.7* 30.2* 21.4* 22.5* 30.9*  MCV 91.1 91.0 89.2 88.9 86.6  PLT 280 PLATELET CLUMPS NOTED ON SMEAR, COUNT APPEARS ADEQUATE 238 229 244   Basic Metabolic Panel:  Recent Labs Lab 06/10/16 1300 06/27/2016 0230 06/12/16 0236 06/12/16 0532 06/14/16 0800 06/14/16 1001 06/16/16 0226  NA 139 137 137  --  129* 134* 132*  K 3.2* 4.5 5.4*  --  2.8* 2.5* 3.1*  CL 100* 98* 97*  --  92* 94* 91*  CO2 26 23 24   --  24 28 26   GLUCOSE 123* 140* 272*  --  311* 172* 275*  BUN 30* 21* 39*  --  63* 21* 62*  CREATININE 3.15* 2.70* 3.62*  --  3.27* 1.36* 2.87*  CALCIUM 8.9 8.8* 9.3  --  8.8* 8.0* 9.0  MG  --   --   --   --   --   --  2.2  PHOS 2.9  --   --  5.1* 2.6  --   --    GFR: Estimated Creatinine Clearance: 29.5  mL/min (A) (by C-G formula based on SCr of 2.87 mg/dL (H)).  Liver Function Tests:  Recent Labs Lab 06/10/16 1300 06/13/2016 0230 06/12/16 0236 06/14/16 0800  AST  --  18 48*  --   ALT  --  22 36  --   ALKPHOS  --  116 167*  --   BILITOT  --  1.9* 1.2  --   PROT  --  7.9 8.3*  --  ALBUMIN 1.7* 1.7* 1.8* 1.4*   No results for input(s): AMMONIA in the last 168 hours.  HbA1C: Hgb A1c MFr Bld  Date/Time Value Ref Range Status  06/09/2016 02:58 AM 6.7 (H) 4.8 - 5.6 % Final    Comment:    (NOTE)         Pre-diabetes: 5.7 - 6.4         Diabetes: >6.4         Glycemic control for adults with diabetes: <7.0   06/08/2016 04:48 AM 6.6 (H) 4.8 - 5.6 % Final    Comment:    (NOTE)         Pre-diabetes: 5.7 - 6.4         Diabetes: >6.4         Glycemic control for adults with diabetes: <7.0     CBG:  Recent Labs Lab 06/15/16 1144 06/15/16 1509 06/15/16 1914 06/15/16 2302 06/16/16 0357  GLUCAP 365* 324* 330* 362* 246*    Scheduled Meds: . acetaminophen  650 mg Per Tube TID  . aspirin  325 mg Oral Daily  . feeding supplement (NEPRO CARB STEADY)  1,000 mL Oral Q24H  . feeding supplement (PRO-STAT SUGAR FREE 64)  30 mL Per Tube TID  . FLUoxetine  20 mg Per Tube Daily  . insulin aspart  0-9 Units Subcutaneous Q4H  . insulin glargine  10 Units Subcutaneous QHS  . mouth rinse  15 mL Mouth Rinse q12n4p  . metoprolol tartrate  12.5 mg Oral BID  . pneumococcal 23 valent vaccine  0.5 mL Intramuscular Tomorrow-1000  . potassium chloride  40 mEq Oral BID  . Zinc Oxide   Topical BID     LOS: 8 days    Stephania Fragmin, MD [facility] Office  [phone] Pager - Text Page per Loretha Stapler as per below:  On-Call/Text Page:      [email or web address]      password [email or web address]  If 7PM-7AM, please contact night-coverage [email or web address] Password [email or web address] 06/16/2016, 11:34 AM

## 2016-06-16 NOTE — Progress Notes (Signed)
[facility] Progress Note   Subjective: Unresponsive to verbal stimuli.   Objective Vitals:   06/16/16 0401 06/16/16 0700 06/16/16 0800 06/16/16 0915  BP: 137/81 133/83 137/73 119/82  Pulse: (!) 107 (!) 107    Resp: (!) 22 (!) 26 (!) 22 (!) 21  Temp: 98.3 F (36.8 C) 98.3 F (36.8 C)    TempSrc: Axillary Axillary    SpO2: 100% 99% 100%   Weight:      Height:       Physical Exam General: Eye open R eye disconjugate, unresponsive to verbal Heart: S1,S2, RRR Lungs: CTAB A/P Abdomen: Active BS. Flexaseal with brown stool. Cont TF.  Extremities: Wound vac L thigh. Heel protectors BLE. No edema Dialysis Access: RIJ TDC. LUA AVG + bruit   Additional Objective Labs: Basic Metabolic Panel:  Recent Labs Lab 06/10/16 1300  06/12/16 0532 06/14/16 0800 06/14/16 1001 06/16/16 0226  NA 139  < >  --  129* 134* 132*  K 3.2*  < >  --  2.8* 2.5* 3.1*  CL 100*  < >  --  92* 94* 91*  CO2 26  < >  --  _0 GLUCOSE 123*  < >  --  311* 172* 275*  BUN 30*  < >  --  63* 21* 62*  CREATININE 3.15*  < >  --  3.27* 1.36* 2.87*  CALCIUM 8.9  < >  --  8.8* 8.0* 9.0  PHOS 2.9  --  5.1* 2.6  --   --   < > = values in this interval not displayed. Liver Function Tests:  Recent Labs Lab 07/05/2016 0230 06/12/16 0236 06/14/16 0800  AST 18 48*  --   ALT 22 36  --   ALKPHOS 116 167*  --   BILITOT 1.9* 1.2  --   PROT 7.9 8.3*  --   ALBUMIN 1.7* 1.8* 1.4*   No results for input(s): LIPASE, AMYLASE in the last 168 hours. CBC:  Recent Labs Lab 06/16/2016 0230 06/12/16 0236 06/14/16 0800 06/14/16 1001 06/16/16 0226  WBC 18.0* 12.4* 17.7* 18.6* 16.6*  HGB 7.9* 9.0* 6.4* 6.8* 9.5*  HCT 26.7* 30.2* 21.4* 22.5* 30.9*  MCV 91.1 91.0 89.2 88.9 86.6  PLT 280 PLATELET CLUMPS NOTED ON SMEAR, COUNT APPEARS ADEQUATE 238 229 244   Blood Culture    Component Value Date/Time   SDES WOUND 07/05/2016 1304   SPECREQUEST LEFT THIGH ABSCESS 06/20/2016 1304   CULT (A) 07/10/2016  1304    MULTIPLE ORGANISMS PRESENT, NONE PREDOMINANT NO ANAEROBES ISOLATED    REPTSTATUS 06/14/2016 FINAL 06/20/2016 1304    Cardiac Enzymes: No results for input(s): CKTOTAL, CKMB, CKMBINDEX, TROPONINI in the last 168 hours. CBG:  Recent Labs Lab 06/15/16 1144 06/15/16 1509 06/15/16 1914 06/15/16 2302 06/16/16 0357  GLUCAP 365* 324* 330* 362* 246*   Studies/Results: No results found. Medications: . sodium chloride    . ceFEPime (MAXIPIME) IV Stopped (06/15/16 2051)  . vancomycin Stopped (06/14/16 1212)   . acetaminophen  650 mg Per Tube TID  . aspirin  325 mg Oral Daily  . feeding supplement (NEPRO CARB STEADY)  1,000 mL Oral Q24H  . feeding supplement (PRO-STAT SUGAR FREE 64)  30 mL Per Tube TID  . FLUoxetine  20 mg Per Tube Daily  . insulin aspart  0-9 Units Subcutaneous Q4H  . insulin glargine  10 Units Subcutaneous QHS  . mouth rinse  15 mL Mouth Rinse q12n4p  . metoprolol tartrate  12.5 mg Oral BID  . pneumococcal 23 valent vaccine  0.5 mL Intramuscular Tomorrow-1000  . Zinc Oxide   Topical BID     Dialysis: [facility]  4:30 hours, 400/800 105.5kg 3K/2Ca TDC R IJ/ maturing LUA AVG Hep 4400 - Mircera 122mg IV q 2 weeks (last given 4/25) - Calcitriol 0.253m PO q HD   Assessment/Plan: 1. Acute multifocal CVAs:Plan per neuro and intensivist 2. AMS: poorly responsive, CVA +/- infection 3. L thigh soft-tissue abscess/ infection:sp I&D 4/10 and repeat I&D 5/2 yesterday by gen surg. VAC in place, IV abx vanc/ cefepime.  4. Abnormal ECHO: Possible vegetation on L coronary cusp, could be cause of multifocal CVA. TEE planned. 1/6 +bcx's for CNSS was felt to be contaminant most likely per ID.  5. ESRD: MWF HD. Using L arm AVG, IR to remove HD cath tomorrow. Next HD 06/28/2016. K+ 3.1 Give KCL 40 MEQ per FT BID today. Recheck labs with HD tomorrow.  6. HTN: home coreg on hold, getting q 8hr IV metoprolol. Will start catapres patch  7. Volume: No recent wt. No  LE edema. HD tomorrow. Ran even last treatment. Run even again tomorrow. 8. Anemia: Hgb 9.5 today, rec'd 2u prbc's 06/14/16. Weekly ESA. Not candidate for IV Fe with infection/ ^^ferritin.Last tsat 4/19 not calc due to low transferrin. Ferritin 450 >1000>3100 over last 3 mos.  9. Metabolic bone disease: Ca Holding VDRA/ binders for now 10. Nutrition: Alb very low, getting Cortrak TF's. 11. Disposition: Family met with palliative care yesterday. Patient remains a full code. Family has little to no understanding of patient's current condition, believes she will get better. Now plans are in place for peg placement.  Rita H. Brown NP-C 06/16/2016, 10:28 AM  [facility] [phone] seen, examined and agree w A/P as above.  RoKelly SplinterD CaNewell Rubbermaidager [phone] 06/16/2016, 2:44 PM

## 2016-06-17 ENCOUNTER — Inpatient Hospital Stay (HOSPITAL_COMMUNITY): Payer: Medicaid Other

## 2016-06-17 ENCOUNTER — Inpatient Hospital Stay (HOSPITAL_COMMUNITY): Payer: Medicaid Other | Admitting: Anesthesiology

## 2016-06-17 ENCOUNTER — Encounter (HOSPITAL_COMMUNITY): Payer: Self-pay | Admitting: Certified Registered Nurse Anesthetist

## 2016-06-17 ENCOUNTER — Encounter (HOSPITAL_COMMUNITY): Admission: EM | Disposition: E | Payer: Self-pay | Source: Home / Self Care | Attending: Internal Medicine

## 2016-06-17 DIAGNOSIS — I638 Other cerebral infarction: Secondary | ICD-10-CM

## 2016-06-17 HISTORY — PX: TEE WITHOUT CARDIOVERSION: SHX5443

## 2016-06-17 HISTORY — PX: IR REMOVAL TUN CV CATH W/O FL: IMG2289

## 2016-06-17 LAB — GLUCOSE, CAPILLARY
GLUCOSE-CAPILLARY: 269 mg/dL — AB (ref 65–99)
GLUCOSE-CAPILLARY: 318 mg/dL — AB (ref 65–99)
GLUCOSE-CAPILLARY: 316 mg/dL — AB (ref 65–99)
GLUCOSE-CAPILLARY: 211 mg/dL — AB (ref 65–99)
Glucose-Capillary: 235 mg/dL — ABNORMAL HIGH (ref 65–99)
Glucose-Capillary: 260 mg/dL — ABNORMAL HIGH (ref 65–99)
Glucose-Capillary: 287 mg/dL — ABNORMAL HIGH (ref 65–99)
Glucose-Capillary: 239 mg/dL — ABNORMAL HIGH (ref 65–99)
Glucose-Capillary: 225 mg/dL — ABNORMAL HIGH (ref 65–99)

## 2016-06-17 LAB — CBC
HCT: 29 % — ABNORMAL LOW (ref 36.0–46.0)
Hemoglobin: 8.9 g/dL — ABNORMAL LOW (ref 12.0–15.0)
MCH: 26.8 pg (ref 26.0–34.0)
MCHC: 30.7 g/dL (ref 30.0–36.0)
MCV: 87.3 fL (ref 78.0–100.0)
Platelets: 257 10*3/uL (ref 150–400)
RBC: 3.32 MIL/uL — ABNORMAL LOW (ref 3.87–5.11)
RDW: 19.8 % — ABNORMAL HIGH (ref 11.5–15.5)
WBC: 15.2 10*3/uL — ABNORMAL HIGH (ref 4.0–10.5)

## 2016-06-17 LAB — BASIC METABOLIC PANEL WITH GFR
Anion gap: 12 (ref 5–15)
BUN: 91 mg/dL — ABNORMAL HIGH (ref 6–20)
CO2: 25 mmol/L (ref 22–32)
Calcium: 9.1 mg/dL (ref 8.9–10.3)
Chloride: 95 mmol/L — ABNORMAL LOW (ref 101–111)
Creatinine, Ser: 3.56 mg/dL — ABNORMAL HIGH (ref 0.44–1.00)
GFR calc Af Amer: 17 mL/min — ABNORMAL LOW
GFR calc non Af Amer: 15 mL/min — ABNORMAL LOW
Glucose, Bld: 303 mg/dL — ABNORMAL HIGH (ref 65–99)
Potassium: 4.1 mmol/L (ref 3.5–5.1)
Sodium: 132 mmol/L — ABNORMAL LOW (ref 135–145)

## 2016-06-17 LAB — MAGNESIUM: MAGNESIUM: 2.3 mg/dL (ref 1.7–2.4)

## 2016-06-17 SURGERY — ECHOCARDIOGRAM, TRANSESOPHAGEAL
Anesthesia: General

## 2016-06-17 MED ORDER — PIPERACILLIN-TAZOBACTAM 3.375 G IVPB
3.3750 g | Freq: Two times a day (BID) | INTRAVENOUS | Status: DC
  Administered 2016-06-17 – 2016-06-27 (×20): 3.375 g via INTRAVENOUS
  Filled 2016-06-17 (×23): qty 50

## 2016-06-17 MED ORDER — PROPOFOL 500 MG/50ML IV EMUL
INTRAVENOUS | Status: DC | PRN
  Administered 2016-06-17: 100 ug/kg/min via INTRAVENOUS

## 2016-06-17 MED ORDER — SODIUM CHLORIDE 0.9 % IV SOLN
INTRAVENOUS | Status: DC
  Administered 2016-06-17: 09:00:00 via INTRAVENOUS

## 2016-06-17 MED ORDER — LIDOCAINE HCL (PF) 1 % IJ SOLN
INTRAMUSCULAR | Status: AC | PRN
  Administered 2016-06-17: 5 mL

## 2016-06-17 MED ORDER — ASPIRIN 325 MG PO TABS
325.0000 mg | ORAL_TABLET | Freq: Every day | ORAL | Status: DC
  Administered 2016-06-18 – 2016-07-15 (×24): 325 mg
  Filled 2016-06-17 (×25): qty 1

## 2016-06-17 MED ORDER — ATORVASTATIN CALCIUM 10 MG PO TABS
20.0000 mg | ORAL_TABLET | Freq: Every evening | ORAL | Status: DC
  Administered 2016-06-18 – 2016-07-14 (×25): 20 mg
  Filled 2016-06-17: qty 2
  Filled 2016-06-17: qty 1
  Filled 2016-06-17 (×3): qty 2
  Filled 2016-06-17 (×3): qty 1
  Filled 2016-06-17 (×11): qty 2
  Filled 2016-06-17: qty 1
  Filled 2016-06-17 (×4): qty 2

## 2016-06-17 MED ORDER — ACETAMINOPHEN 325 MG PO TABS
650.0000 mg | ORAL_TABLET | ORAL | Status: DC | PRN
  Administered 2016-06-19 – 2016-07-06 (×8): 650 mg
  Filled 2016-06-17 (×9): qty 2

## 2016-06-17 MED ORDER — METOPROLOL TARTRATE 25 MG PO TABS
25.0000 mg | ORAL_TABLET | Freq: Two times a day (BID) | ORAL | Status: DC
  Administered 2016-06-17 – 2016-06-19 (×4): 25 mg
  Filled 2016-06-17 (×5): qty 1

## 2016-06-17 MED ORDER — SODIUM CHLORIDE 0.9 % IV SOLN
INTRAVENOUS | Status: DC | PRN
  Administered 2016-06-17: 09:00:00 via INTRAVENOUS

## 2016-06-17 MED ORDER — CHLORHEXIDINE GLUCONATE 4 % EX LIQD
CUTANEOUS | Status: AC
  Filled 2016-06-17: qty 15

## 2016-06-17 MED ORDER — LIDOCAINE HCL 1 % IJ SOLN
INTRAMUSCULAR | Status: AC
  Filled 2016-06-17: qty 20

## 2016-06-17 MED ORDER — BUTAMBEN-TETRACAINE-BENZOCAINE 2-2-14 % EX AERO
INHALATION_SPRAY | CUTANEOUS | Status: DC | PRN
  Administered 2016-06-17: 1 via TOPICAL

## 2016-06-17 MED ORDER — INSULIN GLARGINE 100 UNIT/ML ~~LOC~~ SOLN
28.0000 [IU] | Freq: Every day | SUBCUTANEOUS | Status: DC
  Administered 2016-06-17 – 2016-06-18 (×2): 28 [IU] via SUBCUTANEOUS
  Filled 2016-06-17 (×3): qty 0.28

## 2016-06-17 NOTE — Transfer of Care (Signed)
Immediate Anesthesia Transfer of Care Note  Patient: [name]  Procedure(s) Performed: Procedure(s): TRANSESOPHAGEAL ECHOCARDIOGRAM (TEE) (N/A)  Patient Location: Endoscopy Unit  Anesthesia Type:MAC  Level of Consciousness: awake and unresponsive  Airway & Oxygen Therapy: Patient Spontanous Breathing and Patient connected to nasal cannula oxygen  Post-op Assessment: Report given to RN, Post -op Vital signs reviewed and stable and Patient moving all extremities X 4  Post vital signs: Reviewed and stable  Last Vitals:  Vitals:   07/07/2016 0720 06/16/2016 0815  BP: 125/86 120/73  Pulse: 96 93  Resp: (!) 24 (!) 21  Temp: 36.3 C     Last Pain:  Vitals:   07/07/2016 0815  TempSrc:   PainSc: 0-No pain         Complications: No apparent anesthesia complications

## 2016-06-17 NOTE — Clinical Social Work Note (Signed)
CSW continues to follow for discharge needs.  Chena Chohan, CSW [phone]  

## 2016-06-17 NOTE — Progress Notes (Signed)
Cortrak Tube Team Note:  Consult received to place a Cortrak feeding tube.   A 10 F Cortrak tube was placed in the right nare and secured with a nasal bridle at 72 cm. Per the Cortrak monitor reading the tube tip is stomach.   No x-ray is required. RN may begin using tube.   If the tube becomes dislodged please keep the tube and contact the Cortrak team at [email or web address] (password [email or web address]) for replacement.  If after hours and replacement cannot be delayed, place a NG tube and confirm placement with an abdominal x-ray.    Betsey Holidayasey Aubreyanna Dorrough, RD, LDN Pager #- [phone]

## 2016-06-17 NOTE — Anesthesia Postprocedure Evaluation (Addendum)
Anesthesia Post Note  Patient: [name]  Procedure(s) Performed: Procedure(s) (LRB): TRANSESOPHAGEAL ECHOCARDIOGRAM (TEE) (N/A)  Patient location during evaluation: PACU Anesthesia Type: General Level of consciousness: awake and alert Pain management: pain level controlled Vital Signs Assessment: post-procedure vital signs reviewed and stable Respiratory status: spontaneous breathing, nonlabored ventilation, respiratory function stable and patient connected to nasal cannula oxygen Cardiovascular status: stable and blood pressure returned to baseline Anesthetic complications: no       Last Vitals:  Vitals:   03-Dec-2016 0940 03-Dec-2016 0948  BP: (!) 93/34 (!) 104/40  Pulse: 92 93  Resp: (!) 26 (!) 25  Temp:      Last Pain:  Vitals:   03-Dec-2016 0948  TempSrc:   PainSc: 0-No pain                 Shelton SilvasKevin D Jaquala Fuller

## 2016-06-17 NOTE — Progress Notes (Addendum)
[place] TEAM 1 - Stepdown/ICU TEAM  Linda DykesDavenia McKinnon [name]  [id number] DOB: 04/10/1975 DOA: 05/12/2016 PCP: Marletta LorBarr, Julie, NP    Brief Narrative:  41 y.o.woman with a history of CVA, DM2, morbid obesity, chronic lymphedema, HTN, chronic diastolic heart failure, depression, and admission 3/21 > 4/20 for acute encephalopathy, acute renal failure on CKD 5 requiring HD, decompensated heart failure, and infected leg ulcer.  She was discharged to a SNF.  She had been dialyzing, presumably on MWF.  Upon completion of her HD session on the day of presentation, she was noted to have mental status changes.  It was not clear to the personnel when she was last known normal.  She was transferred to the ED where she had a WBC count of 20, Hgb 9, Ammonia level normal.  Head CT was negative for acute process.  MRI/MRA noted acute right deep gray nuclei and right hippocampal infarcts with an occluded R MCA, Right ICA, and Left vertebral artery.  Subjective: The patient remains obtunded - she makes no attempt to communicate and is fully unresponsive during my exam.   Assessment & Plan:  Acute metabolic encephalopathy in setting of chronic encephalopathy During last admit mental status apparently waxed and waned - Psych saw in consultation and suggested tx for depression - her MS is not improving at all, making it less likely to be connected to her sepsis - I too am concerned as is Palliative Care that this may be a result of her confirmed acute CVAs at presentation - avoid any sedating meds - attempt to resume usual home meds as able via Cortrak tube - consider Neuro re-eval for prognostic purposes - repeat head imaging w/ CT for now    L posterior/medial thigh wound s/p debridement by [facility] Surgy 05/21/16 - completed a course of abx tx on 05/31/16 - CT of the thigh this admit noted a new area c/w an abscess not communicating with the currently opened area - Gen Surg took her to the OR 06/27/2016 - broad abx coverage  continues as suggested by ID - wound care per Gen Surgery   ?SBE - Possible 7x686mm vegetation L coronary cusp (per TTE) - ruled out via TEE Blood cx during last admit 1 of 6 + for coag neg Staph strongly suggestive of contaminant - no such mass noted on TTE 03/22/16 - TEE 06/16/2016 w/o evidence of SBE/vegetation - abx per ID    Acute, multifocal CVAs with history of multiple prior CVAs bedbound at baseline due to prior CVAs - Neurology has evaluated and since signed off - they felt her AMS was out of proportion to her CVAs   ESRD on HD Nephrology following - for HD M/W/F   Reactive RPR - false + Quant f/u RPR 1:1 w/ negative T pallidum abs - ID feels this is a false +  Morbid obesity - Body mass index is 41.41 kg/m.   Nutrition  Cortrak feeds to continue when repositioned (suspect dislodged during TEE) - for eventual PEG  HTN BP controlled - wish to avoid hypotension during HD   DM CBG not at goal - adjust tx and follow   Chronic diastolic congestive heart failure TTE 4/28 noted EF 55-60% w/ grade 1 DD - EDW per HD center is 105.5kg - no signif volume overload at this time   [employer] PowerFiled Weights   06/09/16 0500  Weight: 102.7 kg (226 lb 6.6 oz)    DVT prophylaxis: SCDs Code Status: FULL CODE Family Communication: no family  present at time of exam  Disposition Plan: SDU  Consultants:  Neurology  Gen Surgery Nephrology   Procedures: none  Antimicrobials:  Zosyn 4/30 > 5/02 + 5/07 > Cefepime 5/02 > 5/06 Vanc 4/30 >   Objective: Blood pressure 134/87, pulse 96, temperature 97.8 F (36.6 C), temperature source Axillary, resp. rate (!) 21, height 5\' 2"  (1.575 m), weight 102.7 kg (226 lb 6.6 oz), SpO2 96 %.  Intake/Output Summary (Last 24 hours) at  1400 Last data filed at 06/15/2016 1610  Gross per 24 hour  Intake              450 ml  Output                0 ml  Net              450 ml   Filed Weights   06/09/16 0500  Weight: 102.7 kg (226 lb 6.6 oz)     Examination: General: No acute respiratory distress - remains obtunded  Lungs: Clear to auscultation bilaterally - no wheezing  Cardiovascular: Regular rate and rhythm  Abdomen: Obese, soft, bowel sounds present, no organomegaly, no rebound  Extremities: L medial thigh wound dressed and presently dry - change of chronic venous statis dermatitis noted B w/o change  Neurologic: Obtunded, no Babinski - does not withdraw from painful stimuli   CBC:  Recent Labs Lab 06/12/16 0236 06/14/16 0800 06/14/16 1001 06/16/16 0226 07/11/2016 0151  WBC 12.4* 17.7* 18.6* 16.6* 15.2*  HGB 9.0* 6.4* 6.8* 9.5* 8.9*  HCT 30.2* 21.4* 22.5* 30.9* 29.0*  MCV 91.0 89.2 88.9 86.6 87.3  PLT PLATELET CLUMPS NOTED ON SMEAR, COUNT APPEARS ADEQUATE 238 229 244 257   Basic Metabolic Panel:  Recent Labs Lab 06/12/16 0236 06/12/16 0532 06/14/16 0800 06/14/16 1001 06/16/16 0226 06/22/2016 0151  NA 137  --  129* 134* 132* 132*  K 5.4*  --  2.8* 2.5* 3.1* 4.1  CL 97*  --  92* 94* 91* 95*  CO2 24  --  24 28 26 25   GLUCOSE 272*  --  311* 172* 275* 303*  BUN 39*  --  63* 21* 62* 91*  CREATININE 3.62*  --  3.27* 1.36* 2.87* 3.56*  CALCIUM 9.3  --  8.8* 8.0* 9.0 9.1  MG  --   --   --   --  2.2 2.3  PHOS  --  5.1* 2.6  --   --   --    GFR: Estimated Creatinine Clearance: 23.8 mL/min (A) (by C-G formula based on SCr of 3.56 mg/dL (H)).  Liver Function Tests:  Recent Labs Lab 06-15-2016 0230 06/12/16 0236 06/14/16 0800  AST 18 48*  --   ALT 22 36  --   ALKPHOS 116 167*  --   BILITOT 1.9* 1.2  --   PROT 7.9 8.3*  --   ALBUMIN 1.7* 1.8* 1.4*    HbA1C: Hgb A1c MFr Bld  Date/Time Value Ref Range Status  06/09/2016 02:58 AM 6.7 (H) 4.8 - 5.6 % Final    Comment:    (NOTE)         Pre-diabetes: 5.7 - 6.4         Diabetes: >6.4         Glycemic control for adults with diabetes: <7.0   06/08/2016 04:48 AM 6.6 (H) 4.8 - 5.6 % Final    Comment:    (NOTE)         Pre-diabetes: 5.7 -  6.4          Diabetes: >6.4         Glycemic control for adults with diabetes: <7.0     CBG:  Recent Labs Lab 06/16/16 1918 06/16/16 2300 07/03/2016 0403 06/25/2016 0723 06/14/2016 1129  GLUCAP 287* 316* 260* 239* 211*    Scheduled Meds: . acetaminophen  650 mg Per Tube TID  . aspirin  325 mg Oral Daily  . feeding supplement (NEPRO CARB STEADY)  1,000 mL Oral Q24H  . feeding supplement (PRO-STAT SUGAR FREE 64)  30 mL Per Tube TID  . FLUoxetine  20 mg Per Tube Daily  . insulin aspart  0-9 Units Subcutaneous Q4H  . insulin glargine  20 Units Subcutaneous QHS  . mouth rinse  15 mL Mouth Rinse q12n4p  . metoprolol tartrate  25 mg Oral BID  . pneumococcal 23 valent vaccine  0.5 mL Intramuscular Tomorrow-1000  . Zinc Oxide   Topical BID     LOS: 9 days    Lonia Blood, MD [facility] Office  [phone] Pager - Text Page per Amion as per below:  On-Call/Text Page:      [email or web address]      password [email or web address]  If 7PM-7AM, please contact night-coverage [email or web address] Password [email or web address] 06/27/2016, 2:00 PM

## 2016-06-17 NOTE — Progress Notes (Signed)
[facility] Progress Note  Day of Surgery  Subjective: CC: encephalopathy Just returned from TEE. Non-verbal, not FC  Objective: Vital signs in last 24 hours: Temp:  [97.4 F (36.3 C)-98.3 F (36.8 C)] 97.7 F (36.5 C) (05/07 0930) Pulse Rate:  [91-100] 93 (05/07 0948) Resp:  [19-26] 25 (05/07 0948) BP: (90-125)/(34-86) 104/40 (05/07 0948) SpO2:  [95 %-100 %] 96 % (05/07 0948) Last BM Date: 06/15/16  Intake/Output from previous day: 05/06 0701 - 05/07 0700 In: 980 [NG/GT:880; IV Piggyback:100] Out: -  Intake/Output this shift: Total I/O In: 50 [I.V.:50] Out: -   PE: Gen:  Not, responding Left thigh wound:     Lab Results:   Recent Labs  06/16/16 0226 07/02/2016 0151  WBC 16.6* 15.2*  HGB 9.5* 8.9*  HCT 30.9* 29.0*  PLT 244 257   BMET  Recent Labs  06/16/16 0226 07/07/2016 0151  NA 132* 132*  K 3.1* 4.1  CL 91* 95*  CO2 26 25  GLUCOSE 275* 303*  BUN 62* 91*  CREATININE 2.87* 3.56*  CALCIUM 9.0 9.1   PT/INR No results for input(s): LABPROT, INR in the last 72 hours. CMP     Component Value Date/Time   NA 132 (L) 06/25/2016 0151   NA 137 09/16/2013 1301   K 4.1 06/19/2016 0151   K 4.7 09/16/2013 1301   CL 95 (L) 06/16/2016 0151   CL 108 (H) 09/16/2013 1301   CO2 25 06/20/2016 0151   CO2 24 09/16/2013 1301   GLUCOSE 303 (H) 06/11/2016 0151   GLUCOSE 125 (H) 09/16/2013 1301   BUN 91 (H) 06/28/2016 0151   BUN 15 09/16/2013 1301   CREATININE 3.56 (H) 06/16/2016 0151   CREATININE 1.79 (H) 09/16/2013 1301   CALCIUM 9.1 06/18/2016 0151   CALCIUM 8.3 (L) 09/16/2013 1301   PROT 8.3 (H) 06/12/2016 0236   PROT 7.0 09/16/2013 1301   ALBUMIN 1.4 (L) 06/14/2016 0800   ALBUMIN 1.3 (L) 09/16/2013 1301   AST 48 (H) 06/12/2016 0236   AST 10 (L) 09/16/2013 1301   ALT 36 06/12/2016 0236   ALT 11 (L) 09/16/2013 1301   ALKPHOS 167 (H) 06/12/2016 0236   ALKPHOS 59 09/16/2013 1301   BILITOT 1.2 06/12/2016 0236   BILITOT 0.2 09/16/2013 1301   GFRNONAA 15 (L) 07/01/2016 0151   GFRNONAA 36 (L) 09/16/2013 1301   GFRAA 17 (L) 07/10/2016 0151   GFRAA 42 (L) 09/16/2013 1301   Lipase  No results found for: LIPASE  Studies/Results: No results found.  Anti-infectives: Anti-infectives    Start     Dose/Rate Route Frequency Ordered Stop   06/14/16 0931  vancomycin (VANCOCIN) 1-5 GM/200ML-% IVPB    Comments:  Bluford Kaufmann   : cabinet override      06/14/16 0931 06/14/16 1113   06/12/16 1700  ceFEPIme (MAXIPIME) 1 g in dextrose 5 % 50 mL IVPB     1 g 100 mL/hr over 30 Minutes Intravenous Every 24 hours 06/12/16 1648     06/12/16 0959  vancomycin (VANCOCIN) 1-5 GM/200ML-% IVPB    Comments:  Carlyon Prows   : cabinet override      06/12/16 0959 06/12/16 1015   06/10/16 1200  vancomycin (VANCOCIN) IVPB 1000 mg/200 mL premix     1,000 mg 200 mL/hr over 60 Minutes Intravenous Every M-W-F (Hemodialysis) 06/10/16 0851     06/10/16 1000  piperacillin-tazobactam (ZOSYN) IVPB 3.375 g  Status:  Discontinued     3.375 g 12.5 mL/hr  over 240 Minutes Intravenous Every 12 hours 06/10/16 0851 06/12/16 1629   06/10/16 1000  vancomycin (VANCOCIN) 2,250 mg in sodium chloride 0.9 % 500 mL IVPB  Status:  Discontinued     2,250 mg 250 mL/hr over 120 Minutes Intravenous Every 12 hours 06/10/16 0851 06/10/16 0858   06/10/16 1000  vancomycin (VANCOCIN) 2,250 mg in sodium chloride 0.9 % 500 mL IVPB     2,250 mg 250 mL/hr over 120 Minutes Intravenous  Once 06/10/16 0858 06/10/16 1640     Assessment/Plan Assessment/Plan Left thigh soft tissue infection s/p excision of 15x6x2.5 cm area skin and subcutaneous tissue of left thigh 05/21/16, Dr. Emelia LoronMatthew Wakefield s/p Excision of skin and subcutaneous tissue of left thigh (25 x 8 x 3 cm), 06/26/2016, Dr. Manus RuddMatthew Tsuei - afebrile, Leukocytosis improving (15.2 from 16.6) - continue VAC changes M/W/F  Acute subcentimeter RIGHT deep gray nuclei and RIGHT hippocampal infarcts Chronically occluded RIGHT internal  carotid artery, and LEFT vertebral artery Hypertension ESRD HD - MWF Hypertension Anemia FEN: No IV fluids or diet ordered - Cortrak day 2 ID: Zosyn/Vancomycin 06/10/16, converted to Cefepime 5/2 >> (day #6) DVT: Heparin   Plan:  Continue wound vac M/W/F   LOS: 9 days    Adam PhenixElizabeth S Tailynn Armetta , [facility] LL[facility]-C [facility] 07/01/2016, 10:21 AM Pager: [phone] Consults: [phone] Mon-Fri 7:00 am-4:30 pm Sat-Sun 7:00 am-11:30 am

## 2016-06-17 NOTE — Anesthesia Preprocedure Evaluation (Addendum)
Anesthesia Evaluation  Patient identified by MRN, date of birth, ID band Patient unresponsive    Reviewed: Allergy & Precautions, NPO status , Patient's Chart, lab work & pertinent test results, Unable to perform ROS - Chart review only  Airway Mallampati: III  TM Distance: >3 FB Neck ROM: Full    Dental  (+) Dental Advisory Given, Edentulous Upper, Edentulous Lower Unable to assess, pt not cooperative. :   Pulmonary asthma ,     + decreased breath sounds      Cardiovascular hypertension, Pt. on medications and Pt. on home beta blockers +CHF   Rhythm:Regular Rate:Normal     Neuro/Psych PSYCHIATRIC DISORDERS Depression CVA, Residual Symptoms    GI/Hepatic Neg liver ROS, GERD  ,  Endo/Other  diabetes, Type 2, Insulin Dependent  Renal/GU Renal disease  negative genitourinary   Musculoskeletal negative musculoskeletal ROS (+)   Abdominal   Peds negative pediatric ROS (+)  Hematology negative hematology ROS (+)   Anesthesia Other Findings   Reproductive/Obstetrics negative OB ROS                          Lab Results  Component Value Date   WBC 15.2 (H) 06/13/2016   HGB 8.9 (L)    HCT 29.0 (L) 06/25/2016   MCV 87.3 06/14/2016   PLT 257 06/16/2016   Lab Results  Component Value Date   CREATININE 3.56 (H)    BUN 91 (H) 06/12/2016   NA 132 (L) 07/07/2016   K 4.1 06/24/2016   CL 95 (L) 07/02/2016   CO2 25 06/16/2016   Lab Results  Component Value Date   INR 1.90 05/08/2016   INR 1.70 05/07/2016   Echo: - Left ventricle: The cavity size was normal. Wall thickness was   increased in a pattern of moderate LVH. There was moderate   concentric hypertrophy. Systolic function was normal. The   estimated ejection fraction was in the range of 55% to 60%. Wall   motion was normal; there were no regional wall motion   abnormalities. Features are consistent with a  pseudonormal left   ventricular filling pattern, with concomitant abnormal relaxation   and increased filling pressure (grade 2 diastolic dysfunction).   Doppler parameters are consistent with elevated ventricular   end-diastolic filling pressure. - Mitral valve: There was mild regurgitation. - Left atrium: The atrium was moderately dilated. - Right ventricle: The cavity size was mildly dilated. Wall   thickness was normal.   Anesthesia Physical Anesthesia Plan  ASA: III  Anesthesia Plan: MAC   Post-op Pain Management:    Induction: Intravenous  Airway Management Planned: Natural Airway  Additional Equipment:   Intra-op Plan:   Post-operative Plan:   Informed Consent: I have reviewed the patients History and Physical, chart, labs and discussed the procedure including the risks, benefits and alternatives for the proposed anesthesia with the patient or authorized representative who has indicated his/her understanding and acceptance.     Plan Discussed with: CRNA  Anesthesia Plan Comments:        Anesthesia Quick Evaluation

## 2016-06-17 NOTE — Progress Notes (Signed)
    Transesophageal Echocardiogram Note  [name] [id number] 04/28/1975  Procedure: Transesophageal Echocardiogram Indications: SBE  Procedure Details Consent: Obtained Time Out: Verified patient identification, verified procedure, site/side was marked, verified correct patient position, special equipment/implants available, Radiology Safety Procedures followed,  medications/allergies/relevent history reviewed, required imaging and test results available.  Performed  Medications:  Pt sedated by anesthesia with diprovan 170 mg IV.  Normal LV systolic function; no vegetations; negative saline microcavitation study; full report to follow.   Complications: No apparent complications Patient did tolerate procedure well.  Olga MillersBrian Crenshaw, MD

## 2016-06-17 NOTE — Progress Notes (Signed)
OT Cancellation Note  Patient Details Name: [name] MRN: [id number] DOB: 08/24/1975   Cancelled Treatment:    Reason Eval/Treat Not Completed: Patient at procedure or test/ unavailable. Pt currently in echo lab. Will re-attempt tx at later time.  Evette GeorgesLeonard, Ferman Basilio [name] [phone] 07/06/2016, 8:52 AM

## 2016-06-17 NOTE — Progress Notes (Signed)
INFECTIOUS DISEASE PROGRESS NOTE  ID: [name] is a 41 y.o. female with  Principal Problem:   CVA (cerebral vascular accident) (HCC) Active Problems:   Morbid obesity (HCC)   Essential hypertension   Acute metabolic encephalopathy   Uncontrolled diabetes mellitus with diabetic nephropathy, with long-term current use of insulin (HCC)   ESRD (end stage renal disease) (HCC)   Depression due to physical illness   Wound abscess  Subjective: Opens eyes to voice, does not interact.   Abtx:  Anti-infectives    Start     Dose/Rate Route Frequency Ordered Stop   06/14/16 0931  vancomycin (VANCOCIN) 1-5 GM/200ML-% IVPB    Comments:  [name], [name]   : cabinet override      06/14/16 0931 06/14/16 1113   06/12/16 1700  ceFEPIme (MAXIPIME) 1 g in dextrose 5 % 50 mL IVPB     1 g 100 mL/hr over 30 Minutes Intravenous Every 24 hours 06/12/16 1648     06/12/16 0959  vancomycin (VANCOCIN) 1-5 GM/200ML-% IVPB    Comments:  [name], [name]   : cabinet override      06/12/16 0959 06/12/16 1015   06/10/16 1200  vancomycin (VANCOCIN) IVPB 1000 mg/200 mL premix     1,000 mg 200 mL/hr over 60 Minutes Intravenous Every M-W-F (Hemodialysis) 06/10/16 0851     06/10/16 1000  piperacillin-tazobactam (ZOSYN) IVPB 3.375 g  Status:  Discontinued     3.375 g 12.5 mL/hr over 240 Minutes Intravenous Every 12 hours 06/10/16 0851 06/12/16 1629   06/10/16 1000  vancomycin (VANCOCIN) 2,250 mg in sodium chloride 0.9 % 500 mL IVPB  Status:  Discontinued     2,250 mg 250 mL/hr over 120 Minutes Intravenous Every 12 hours 06/10/16 0851 06/10/16 0858   06/10/16 1000  vancomycin (VANCOCIN) 2,250 mg in sodium chloride 0.9 % 500 mL IVPB     2,250 mg 250 mL/hr over 120 Minutes Intravenous  Once 06/10/16 0858 06/10/16 1640      Medications:  Scheduled: . acetaminophen  650 mg Per Tube TID  . aspirin  325 mg Oral Daily  . feeding supplement (NEPRO CARB STEADY)  1,000 mL Oral Q24H  . feeding supplement  (PRO-STAT SUGAR FREE 64)  30 mL Per Tube TID  . FLUoxetine  20 mg Per Tube Daily  . insulin aspart  0-9 Units Subcutaneous Q4H  . insulin glargine  20 Units Subcutaneous QHS  . mouth rinse  15 mL Mouth Rinse q12n4p  . metoprolol tartrate  25 mg Oral BID  . pneumococcal 23 valent vaccine  0.5 mL Intramuscular Tomorrow-1000  . Zinc Oxide   Topical BID    Objective: Vital signs in last 24 hours: Temp:  [97.4 F (36.3 C)-98.3 F (36.8 C)] 97.7 F (36.5 C) (05/07 0930) Pulse Rate:  [91-100] 93 (05/07 0948) Resp:  [19-26] 25 (05/07 0948) BP: (90-125)/(34-86) 104/40 (05/07 0948) SpO2:  [95 %-100 %] 96 % (05/07 0948)   General appearance: no distress Resp: clear to auscultation bilaterally Cardio: regular rate and rhythm GI: normal findings: bowel sounds normal and soft, non-tender  Lab Results  Recent Labs  06/16/16 0226 06/14/2016 0151  WBC 16.6* 15.2*  HGB 9.5* 8.9*  HCT 30.9* 29.0*  NA 132* 132*  K 3.1* 4.1  CL 91* 95*  CO2 26 25  BUN 62* 91*  CREATININE 2.87* 3.56*   Liver Panel No results for input(s): PROT, ALBUMIN, AST, ALT, ALKPHOS, BILITOT, BILIDIR, IBILI in the last 72 hours.  Sedimentation Rate No results for input(s): ESRSEDRATE in the last 72 hours. C-Reactive Protein No results for input(s): CRP in the last 72 hours.  Microbiology: Recent Results (from the past 240 hour(s))  Culture, blood (Routine X 2) w Reflex to ID Panel     Status: None   Collection Time: 06/10/16 12:30 PM  Result Value Ref Range Status   Specimen Description BLOOD RIGHT HEMODIALYSIS CATHETER  Final   Special Requests   Final    BOTTLES DRAWN AEROBIC AND ANAEROBIC Blood Culture results may not be optimal due to an excessive volume of blood received in culture bottles   Culture NO GROWTH 5 DAYS  Final   Report Status 06/15/2016 FINAL  Final  Culture, blood (Routine X 2) w Reflex to ID Panel     Status: None   Collection Time: 06/10/16 12:45 PM  Result Value Ref Range Status    Specimen Description BLOOD RIGHT URINE, CATHETERIZED  Final   Special Requests   Final    BOTTLES DRAWN AEROBIC AND ANAEROBIC Blood Culture adequate volume   Culture NO GROWTH 5 DAYS  Final   Report Status 06/15/2016 FINAL  Final  Aerobic/Anaerobic Culture (surgical/deep wound)     Status: Abnormal   Collection Time: 06/14/2016  1:04 PM  Result Value Ref Range Status   Specimen Description WOUND  Final   Special Requests LEFT THIGH ABSCESS  Final   Gram Stain NO WBC SEEN FEW GRAM NEGATIVE RODS   Final   Culture (A)  Final    MULTIPLE ORGANISMS PRESENT, NONE PREDOMINANT NO ANAEROBES ISOLATED    Report Status 06/14/2016 FINAL  Final    Studies/Results: No results found.   Assessment/Plan: ESRD - HD yesterday via Right AVF    -HD line out today  IE of Ao -recent acute multifocal CVAs and fever -BCx 4/30 >>NGTD -Vanco/Zosyn for 30 days of total anbx (today is day 9) -TEE- no vegetation. TTE- coronary cusp lesion.   Left Thigh Wound -s/p debriedment 05/21/16 (poly microbial) -5/1 Debridement- polymicrobial. Abundant bacteroides ovatus will change cefepime to zosyn  CVA with multiple infarcts (old and new)  false positive RPR   Severe, protein calorie malnutrition - nepro TF with prostat started 5/1 - for PEG placement  Diarrhea  Appreciate Palliative care eval.  I agree her prognosis is poor  Available as needed.   Total days of antibiotics: 9vanco/zosyn --> Vanc/Cefepime (5/1) ---> vanco/zosyn (5-7)         Johny Sax Infectious Diseases (pager) [phone] [email or web address] 07/08/2016, 10:02 AM  LOS: 9 days

## 2016-06-17 NOTE — Progress Notes (Signed)
PT Cancellation Note  Patient Details Name: [name] MRN: [id number] DOB: 12/26/1975   Cancelled Treatment:    Reason Eval/Treat Not Completed: Medical issues which prohibited therapy; patient had TEE this am with sedation.  Expect to HD later, but will attempt as time permits.    Elray McgregorCynthia Wynn 07/10/2016, 10:36 AM  Sheran Lawlessyndi Wynn, PT [phone] 06/30/2016

## 2016-06-17 NOTE — Procedures (Signed)
Successful removal of tunneled HD catheter EBL: None No immediate complications.  Jay Usman Millett, MD Pager #: [phone]   

## 2016-06-17 NOTE — Progress Notes (Addendum)
Pharmacy Antibiotic Note  Linda DykesDavenia [name] [name] is a 41 y.o. female admitted on 05/14/2016 with sepsis, possible vegetation seen on ECHO.  Vanc + Cefepime abx D#8 for possible sepsis; found with possible vegetation on L coronary cusp on ECHO. LLE thigh wound - s/p I&D 4/10 and repeat 5/1. Afeb, WBC down to 15.2.  Plan: Continue vancomycin 1g IV QHD-MWF Stop cefepime and start Zosyn 3.375 gm IV q12h (4 hour infusion) Monitor clinical picture, HD sessions, pre-HD VR on 5/9? F/U TEE results, C&S, abx deescalation / LOT  Height: 5\' 2"  (157.5 cm) Weight:  (couldn't get weight bed scale broke) IBW/kg (Calculated) : 50.1  Temp (24hrs), Avg:97.9 F (36.6 C), Min:97.4 F (36.3 C), Max:98.3 F (36.8 C)   Recent Labs Lab 06-14-16 1413 06/12/16 0236 06/14/16 0800 06/14/16 1001 06/16/16 0226 07/11/2016 0151  WBC  --  12.4* 17.7* 18.6* 16.6* 15.2*  CREATININE  --  3.62* 3.27* 1.36* 2.87* 3.56*  LATICACIDVEN 1.4  --   --   --   --   --     Estimated Creatinine Clearance: 23.8 mL/min (A) (by C-G formula based on SCr of 3.56 mg/dL (H)).    Allergies  Allergen Reactions  . Azithromycin Other (See Comments)    Nose bleeding event   Vancomycin 4/30 >> Zosyn 4/30 >> 5/2; 5/7 >> Cefepime 5/2 >> 5/7  RPR Reactive- T Pallidum Abx neg, likely false positive RPR 4/30 BCx >>ngtd 5/1 LLE abscess >> multiple organisms  [name], PharmD, [facility] CenterBCPS Clinical Pharmacist Pager [phone] 07/10/2016 11:12 AM

## 2016-06-17 NOTE — Progress Notes (Signed)
Results for [name] [name], [name] (MRN [id number]) as of 06/12/2016 10:08  Ref. Range 06/16/2016 15:08 06/16/2016 19:18 06/16/2016 23:00 07/10/2016 04:03 07/05/2016 07:23  Glucose-Capillary Latest Ref Range: 65 - 99 mg/dL 657318 (H) 846287 (H) 962316 (H) 260 (H) 239 (H)  Noted that CBGs continue to be greater than 180 mg/dl. Recommend increasing Novolog correction scale to MODERATE every 4 hours and adding Novolog 3 units every 4 hours for tube feed coverage if blood sugars continue to be elevated. Will continue to monitor blood sugars while in the hospital.  Smith MinceKendra Maryjo Ragon RN BSN CDE Diabetes Coordinator Pager: [phone]  8am-5pm

## 2016-06-17 NOTE — Consult Note (Signed)
WOC nurse contacted bedside nurse to discuss NPWT dressing to be changed today. CCS would like to assess thigh wound at the time of the dressing change. Bedside nurse Okey Regalarol, ok to remove with surgery and change NPWT dressing.    Will update orders to have bedside nurse change, now that dressing is less complex.  .  Re consult if needed, will not follow at this time. Thanks  Myron Stankovich M.D.[employer] MSN, RN,CWOCN, CNS [phone])

## 2016-06-17 NOTE — Anesthesia Procedure Notes (Signed)
Procedure Name: MAC Date/Time: 06/16/2016 9:05 AM Performed by: Garrison Columbus T Pre-anesthesia Checklist: Patient identified, Emergency Drugs available, Suction available and Patient being monitored Patient Re-evaluated:Patient Re-evaluated prior to inductionOxygen Delivery Method: Nasal cannula Preoxygenation: Pre-oxygenation with 100% oxygen Intubation Type: IV induction Placement Confirmation: positive ETCO2 and breath sounds checked- equal and bilateral Dental Injury: Teeth and Oropharynx as per pre-operative assessment

## 2016-06-17 NOTE — Progress Notes (Signed)
Unable to give meds thru cortrak as occluded attempted to flush cortrak with warm water unsuccessful, noticed cortrak up coiled in back of mouth called cortrak team to come fix.

## 2016-06-17 NOTE — Progress Notes (Signed)
[facility] Progress Note   Subjective: Unresponsive to verbal stimuli.  Appears to be sleeping.  Appears comfortable  Objective Vitals:    0930 06/15/2016 0940 07/03/2016 0948 06/18/2016 1124  BP: (!) 90/43 (!) 93/34 (!) 104/40 134/87  Pulse: 91 92 93 96  Resp: (!) 25 (!) 26 (!) 25 (!) 21  Temp: 97.7 F (36.5 C)   97.8 F (36.6 C)  TempSrc: Axillary   Axillary  SpO2: 96% 96% 96% 96%  Weight:      Height:       Physical Exam General: Eyes closed HEENT: slightly resists my opening of her eyes bilaterally.  Coretrak in place Heart: S1,S2, RRR Lungs: CTAB A/P Abdomen: Active BS. Flexaseal with brown stool.  Extremities: Wound vac L thigh. Heel protectors BLE. No edema.  Diffuse sarcopenia Dialysis Access: RIJ TDC. LUA AVG + bruit   Additional Objective Labs: Basic Metabolic Panel:  Recent Labs Lab 06/12/16 0532 06/14/16 0800 06/14/16 1001 06/16/16 0226 06/23/2016 0151  NA  --  129* 134* 132* 132*  K  --  2.8* 2.5* 3.1* 4.1  CL  --  92* 94* 91* 95*  CO2  --  '24 28 26 25  '$ GLUCOSE  --  311* 172* 275* 303*  BUN  --  63* 21* 62* 91*  CREATININE  --  3.27* 1.36* 2.87* 3.56*  CALCIUM  --  8.8* 8.0* 9.0 9.1  PHOS 5.1* 2.6  --   --   --    Liver Function Tests:  Recent Labs Lab 06/23/2016 0230 06/12/16 0236 06/14/16 0800  AST 18 48*  --   ALT 22 36  --   ALKPHOS 116 167*  --   BILITOT 1.9* 1.2  --   PROT 7.9 8.3*  --   ALBUMIN 1.7* 1.8* 1.4*   No results for input(s): LIPASE, AMYLASE in the last 168 hours. CBC:  Recent Labs Lab 06/12/16 0236 06/14/16 0800 06/14/16 1001 06/16/16 0226 06/15/2016 0151  WBC 12.4* 17.7* 18.6* 16.6* 15.2*  HGB 9.0* 6.4* 6.8* 9.5* 8.9*  HCT 30.2* 21.4* 22.5* 30.9* 29.0*  MCV 91.0 89.2 88.9 86.6 87.3  PLT PLATELET CLUMPS NOTED ON SMEAR, COUNT APPEARS ADEQUATE 238 229 244 257   Blood Culture    Component Value Date/Time   SDES WOUND 06/30/2016 1304   SPECREQUEST LEFT THIGH ABSCESS 07/06/2016 1304   CULT (A)  06/20/2016 1304    MULTIPLE ORGANISMS PRESENT, NONE PREDOMINANT NO ANAEROBES ISOLATED    REPTSTATUS 06/14/2016 FINAL 07/08/2016 1304    Cardiac Enzymes: No results for input(s): CKTOTAL, CKMB, CKMBINDEX, TROPONINI in the last 168 hours. CBG:  Recent Labs Lab 06/16/16 1918 06/16/16 2300 06/21/2016 0403 07/01/2016 0723 07/08/2016 1129  GLUCAP 287* 316* 260* 239* 211*   Studies/Results: No results found. Medications: . piperacillin-tazobactam (ZOSYN)  IV 3.375 g (06/20/2016 1315)  . vancomycin Stopped (06/14/16 1212)   . [START ON 06/18/2016] aspirin  325 mg Per Tube Daily  . atorvastatin  20 mg Per Tube QPM  . feeding supplement (NEPRO CARB STEADY)  1,000 mL Oral Q24H  . feeding supplement (PRO-STAT SUGAR FREE 64)  30 mL Per Tube TID  . FLUoxetine  20 mg Per Tube Daily  . insulin aspart  0-9 Units Subcutaneous Q4H  . insulin glargine  28 Units Subcutaneous QHS  . mouth rinse  15 mL Mouth Rinse q12n4p  . metoprolol tartrate  25 mg Per Tube BID  . Zinc Oxide   Topical BID  Dialysis: [facility]  4:30 hours, 400/800 105.5kg 3K/2Ca TDC R IJ/ maturing LUA AVG Hep 4400 - Mircera 133mg IV q 2 weeks (last given 4/25) - Calcitriol 0.255m PO q HD   Assessment/Plan: 1. Acute multifocal CVAs:Plan per primary.  No vegetations on TEE.  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  ? If recurrent multifocal infarcts are due to hypotensive ischemic events. 2. AMS: poorly responsive, CVA +/- infection 3. L thigh soft-tissue abscess/ infection:sp I&D 4/10 and repeat I&D 5/2 by gen surg. VAC in place, IV abx vanc/ Zosyn.  ID following.  4. Abnormal ECHO: Possible vegetation on L coronary cusp per TTE--> TEE without vegetation 1/6 +bcx's for CNSS was felt to be contaminant most likely per ID.  5. ESRD: MWF HD. Using L arm AVG, IR to remove HD cath 5/7 Next HD 06/16/2016. K+ 3.1 Give KCL 40 MEQ per FT BID today. Recheck labs with HD tomorrow.  Will keep SBP >  105 6. HTN: home coreg on hold, getting q 8hr IV metoprolol.  catapres patch  7. Volume: No recent wt. No LE edema. Running even 8. Anemia: Hgb 9.5rec'd 2u prbc's 06/14/16. Weekly ESA. Not candidate for IV Fe with infection/ ^^ferritin.Last tsat 4/19 not calc due to low transferrin. Ferritin 450 >1000>3100 over last 3 mos.  9. Metabolic bone disease: Ca Holding VDRA/ binders for now 10. Nutrition: Alb very low, getting Cortrak TF's. 11. Disposition: Family met with palliative care yesterday. Patient remains a full code. Family has little to no understanding of patient's current condition, believes she will get better. Now plans are in place for peg placement.  ElMadelon LipsD CaAcmh Hospitalgr [phone]/08/2016, 2:59 PM

## 2016-06-18 ENCOUNTER — Inpatient Hospital Stay (HOSPITAL_COMMUNITY): Payer: Medicaid Other

## 2016-06-18 LAB — GLUCOSE, CAPILLARY
GLUCOSE-CAPILLARY: 343 mg/dL — AB (ref 65–99)
GLUCOSE-CAPILLARY: 366 mg/dL — AB (ref 65–99)
Glucose-Capillary: 200 mg/dL — ABNORMAL HIGH (ref 65–99)
Glucose-Capillary: 271 mg/dL — ABNORMAL HIGH (ref 65–99)
Glucose-Capillary: 278 mg/dL — ABNORMAL HIGH (ref 65–99)
Glucose-Capillary: 319 mg/dL — ABNORMAL HIGH (ref 65–99)

## 2016-06-18 MED ORDER — SODIUM CHLORIDE 0.9 % IV BOLUS (SEPSIS)
250.0000 mL | Freq: Once | INTRAVENOUS | Status: AC
  Administered 2016-06-18: 250 mL via INTRAVENOUS

## 2016-06-18 MED ORDER — SODIUM CHLORIDE 0.9 % IV BOLUS (SEPSIS): 250.0000 mL | Freq: Once | INTRAVENOUS | Status: DC

## 2016-06-18 MED ORDER — DARBEPOETIN ALFA 150 MCG/0.3ML IJ SOSY
150.0000 ug | PREFILLED_SYRINGE | INTRAMUSCULAR | Status: DC
  Administered 2016-06-19: 150 ug via INTRAVENOUS
  Filled 2016-06-18: qty 0.3

## 2016-06-18 NOTE — Plan of Care (Signed)
Problem: Education: Goal: Knowledge of disease or condition will improve Outcome: Not Met (add Reason) Pt unable to receive education due to current mental status. Does not follow commands. No family at bedside.

## 2016-06-18 NOTE — Progress Notes (Signed)
[facility] TEAM 1 - Stepdown/ICU TEAM  [name]  ZOX:[id number] DOB: 09/29/1975 DOA: 07/07/2016 PCP: Marletta Lor, NP    Brief Narrative:  41 y.o.woman with a history of CVA, DM2, morbid obesity, chronic lymphedema, HTN, chronic diastolic heart failure, depression, and admission 3/21 > 4/20 for acute encephalopathy, acute renal failure on CKD 5 requiring HD, decompensated heart failure, and infected leg ulcer.  She was discharged to a SNF.  She had been dialyzing, presumably on MWF.  Upon completion of her HD session on the day of presentation, she was noted to have mental status changes.  It was not clear to the personnel when she was last known normal.  She was transferred to the ED where she had a WBC count of 20, Hgb 9, Ammonia level normal.  Head CT was negative for acute process.  MRI/MRA noted acute right deep gray nuclei and right hippocampal infarcts with an occluded R MCA, Right ICA, and Left vertebral artery.  Subjective:  Doesn't communicate. Was hypotensive this morning therefore received 250cc normal NS slow bolus. Now improved. Anuric. Good stool output per nurse.   Assessment & Plan:  Acute metabolic encephalopathy in setting of chronic encephalopathy During last admit mental status apparently waxed and waned - Psych saw in consultation and suggested tx for depression  - suspect her acute change is due to TME in setting of sepsis related to her L thigh wound and possible SBE -Cont vanc and zosyn now  -Discontinued clonidine due to sedative effects. Will use other antihypertensive agents - cont supportive care  -We'll consider getting an EEG after her TEE on Monday to evaluate her brain function. Overall she does have poor prognosis and I'm not sure how much she will recover -Appreciate palliative care input  L posterior/medial thigh wound s/p debridement by Len Childs 05/21/16 - completed a course of abx tx on 05/31/16  - CT of the thigh has noted a new area c/w an  abscess not communicating with the currently opened area  - Gen Surgery- Excision of skin and subcutaneous tissue of left thigh (25 x 8 x 3 cm) on 5/1 - broad abx coverage to include MRSA coverage to continue   SBE - Possible 7x34mm vegetation L coronary cusp  Blood cx during last admit 1 of 6 + for coag neg Staph strongly suggestive of contaminant - no such mass noted on TTE 03/22/16   Acute, multifocal CVAs with history of multiple prior CVAs Concerns for cardiac lesion as seen on echocardiogram which could likely be the source of her stroke TEE done on 5/7 - no vegetations seen.   ESRD on HD Nephrology following - for HD M/W/F  TLC out 5/7. Graft functioning well   Anemia likely from chronic disease  -no signs of acute blood loss -Will order 2 units PRBC (5/4)  Reactive RPR Quant f/u RPR 1:1 w/ negative T pallidum abs - ?false negative - ID following   Morbid obesity - Body mass index is 41.41 kg/m.   Nutrition  Cortrak for nutrition. Family okay with PEG, spoke with them over the phone this morning. Will order for Peg placement tomorrow.   HTN -We'll discontinue her clonidine at this time due to possible sedative effects -Hold it this morning due to hypotension.  -metoprolol 12.5 orally twice a day. Lopressor 5 mg IV and hydralazine 5 mg IV when necessary  DM2 CBG elevated, didn't get her lantus yesterday.  Premeal coverage this afternoon. Resistant ISS.   Chronic  diastolic congestive heart failure TTE 4/28 noted EF 55-60% w/ grade 1 DD - EDW per HD center is 105.5kg  Filed Weights   06/09/16 0500  Weight: 102.7 kg (226 lb 6.6 oz)   Palliative care team consulted.   DVT prophylaxis: SCDs Code Status: FULL CODE Family Communication: no family present at time of exam.  Disposition Plan: SDU  Consultants:  Neurology  Gen Surgery Nephrology   Procedures: none  Antimicrobials:  Zosyn 4/30 >5/2 Vanc 4/30 > Cefepime 5/2 >5/7 Zosyn 5/7  Objective: Blood  pressure 116/78, pulse (!) 113, temperature 98.5 F (36.9 C), temperature source Axillary, resp. rate (!) 31, height 5\' 2"  (1.575 m), weight 102.7 kg (226 lb 6.6 oz), SpO2 98 %.  Intake/Output Summary (Last 24 hours) at 06/18/16 1202 Last data filed at 06/18/16 0310  Gross per 24 hour  Intake           327.33 ml  Output              900 ml  Net          -572.67 ml   Filed Weights   06/09/16 0500  Weight: 102.7 kg (226 lb 6.6 oz)    Examination: General: No acute respiratory distress - obtunded  Lungs: Clear to auscultation bilaterally without wheeze - distant BS related to body habitus  Cardiovascular: Regular rate and rhythm without murmur - distant HS  Abdomen: Obese, soft, bowel sounds present, no organomegaly, no rebound - Cortrak in L nare Extremities: L medial thigh wound dressed and presently dry - change of chronic venous statis dermatitis noted B Neurologic: Obtunded, no Babinski - does not withdraw from painful stimuli - opens eyes intermittently but does not follow examiner    CBC:  Recent Labs Lab 06/12/16 0236 06/14/16 0800 06/14/16 1001 06/16/16 0226 06/27/2016 0151  WBC 12.4* 17.7* 18.6* 16.6* 15.2*  HGB 9.0* 6.4* 6.8* 9.5* 8.9*  HCT 30.2* 21.4* 22.5* 30.9* 29.0*  MCV 91.0 89.2 88.9 86.6 87.3  PLT PLATELET CLUMPS NOTED ON SMEAR, COUNT APPEARS ADEQUATE 238 229 244 257   Basic Metabolic Panel:  Recent Labs Lab 06/12/16 0236 06/12/16 0532 06/14/16 0800 06/14/16 1001 06/16/16 0226 06/23/2016 0151  NA 137  --  129* 134* 132* 132*  K 5.4*  --  2.8* 2.5* 3.1* 4.1  CL 97*  --  92* 94* 91* 95*  CO2 24  --  24 28 26 25   GLUCOSE 272*  --  311* 172* 275* 303*  BUN 39*  --  63* 21* 62* 91*  CREATININE 3.62*  --  3.27* 1.36* 2.87* 3.56*  CALCIUM 9.3  --  8.8* 8.0* 9.0 9.1  MG  --   --   --   --  2.2 2.3  PHOS  --  5.1* 2.6  --   --   --    GFR: Estimated Creatinine Clearance: 23.8 mL/min (A) (by C-G formula based on SCr of 3.56 mg/dL (H)).  Liver Function  Tests:  Recent Labs Lab 06/12/16 0236 06/14/16 0800  AST 48*  --   ALT 36  --   ALKPHOS 167*  --   BILITOT 1.2  --   PROT 8.3*  --   ALBUMIN 1.8* 1.4*   No results for input(s): AMMONIA in the last 168 hours.  HbA1C: Hgb A1c MFr Bld  Date/Time Value Ref Range Status  06/09/2016 02:58 AM 6.7 (H) 4.8 - 5.6 % Final    Comment:    (NOTE)  Pre-diabetes: 5.7 - 6.4         Diabetes: >6.4         Glycemic control for adults with diabetes: <7.0   06/08/2016 04:48 AM 6.6 (H) 4.8 - 5.6 % Final    Comment:    (NOTE)         Pre-diabetes: 5.7 - 6.4         Diabetes: >6.4         Glycemic control for adults with diabetes: <7.0     CBG:  Recent Labs Lab 06/20/2016 0723 07/02/2016 1129 06/23/2016 2307 07/07/2016 2330 06/18/16 0321  GLUCAP 239* 211* 200* 225* 271*    Scheduled Meds: . aspirin  325 mg Per Tube Daily  . atorvastatin  20 mg Per Tube QPM  . [START ON 06/19/2016] darbepoetin (ARANESP) injection - DIALYSIS  150 mcg Intravenous Q Wed-HD  . feeding supplement (NEPRO CARB STEADY)  1,000 mL Oral Q24H  . feeding supplement (PRO-STAT SUGAR FREE 64)  30 mL Per Tube TID  . FLUoxetine  20 mg Per Tube Daily  . insulin aspart  0-9 Units Subcutaneous Q4H  . insulin glargine  28 Units Subcutaneous QHS  . mouth rinse  15 mL Mouth Rinse q12n4p  . metoprolol tartrate  25 mg Per Tube BID  . Zinc Oxide   Topical BID     LOS: 10 days    Stephania FragminAnkit Amin, MD [facility] Office  [phone] Pager - Text Page per Loretha StaplerAmion as per below:  On-Call/Text Page:      [email or web address]      password [email or web address]  If 7PM-7AM, please contact night-coverage [email or web address] Password [email or web address] 06/18/2016, 12:02 PM

## 2016-06-18 NOTE — Plan of Care (Signed)
Problem: Physical Regulation: Goal: Ability to maintain clinical measurements within normal limits will improve Outcome: Not Progressing BP have benn low monitoring closley will continue to monitor

## 2016-06-18 NOTE — Progress Notes (Signed)
Physician notified: Reesa Chew At: 38  Regarding:  fyi BP 67-77/47-48. HR 123 ST. RR 30. more lethargic. giving second 168m bolus.  Continue 2584mbolus, MAP goal 60-65. If MAP goal not met after bolus, give third 15052molus and call back.

## 2016-06-18 NOTE — Progress Notes (Signed)
Occupational Therapy Treatment Patient Details Name: Rosia Syme MRN: [id number] DOB: 21-Jan-1976 Today's Date: 06/18/2016    History of present illness Patient is a 41 yo female admitted 05/24/2016 with [facility].  Patient with Rt infarcts, occluded MCA, Rt ICA, and Lt vertebral artery. No verbalizations or voluntary movement.   PMH:  CVA with RLE weak, DM, obesity, chronic lymphedema, wound Rt calf, VAC on Lt thigh, HTN, CHF, depression, ESRD on HD, asthma   OT comments  This 41 yo female seen today in co-treat with PT to look at sitting balance and following commands. Pt did maintain eye opening at least 10% of session but unable to track objects. She did tolerate sitting EOB 10 minutes. Pt not showing progress with following commands. She will continue to benefit from acute OT with follow up OT at SNF.   Follow Up Recommendations  SNF;Supervision/Assistance - 24 hour    Equipment Recommendations  Other (comment) (TBD next venue)       Precautions / Restrictions Precautions Precautions: Fall Precaution Comments: wound vac to L inner thigh Restrictions Weight Bearing Restrictions: No       Mobility Bed Mobility Overal bed mobility: Needs Assistance Bed Mobility: Supine to Sit;Sit to Supine Rolling: +2 for physical assistance;Total assist   Supine to sit: Total assist;+2 for physical assistance Sit to supine: Total assist;+2 for physical assistance   General bed mobility comments: pt did not initiated transfer, total assist for LE mangement and trunk management     Balance Overall balance assessment: Needs assistance Sitting-balance support: No upper extremity supported;Feet supported Sitting balance-Leahy Scale: Zero Sitting balance - Comments: pt sat approx 10 min today with total assist Postural control: Posterior lean                                 ADL either performed or assessed with clinical judgement   ADL Overall ADL's :  Needs assistance/impaired                                       General ADL Comments: pt remains total A for all ADLs     Vision   Additional Comments: pt with eye opening today when we firsted entered and I addressed her then she has them intermittently open throughout session          Cognition Arousal/Alertness: Lethargic Behavior During Therapy: Flat affect Overall Cognitive Status: Impaired/Different from baseline Area of Impairment: Following commands                       Following Commands: Follows one step commands inconsistently       General Comments: pt with increased RR when L LE movement, most likely due to pain              General Comments pt wit noted bilat LE discoloration, L inner thigh wound vac    Pertinent Vitals/ Pain       Pain Assessment: Faces Faces Pain Scale: Hurts even more Pain Location: when tactile cues to L thigh place pt groaning with increased RR, pt with withdrawl to noxious stimuli x 4 extremities Pain Descriptors / Indicators: Moaning;Sore Pain Intervention(s): Monitored during session         Frequency  Min 2X/week        Progress Toward  Goals  OT Goals(current goals can now be found in the care plan section)  Progress towards OT goals: Progressing toward goals  Acute Rehab OT Goals Patient Stated Goal: Unable to state  Plan Discharge plan remains appropriate    Co-evaluation    PT/OT/SLP Co-Evaluation/Treatment: Yes Reason for Co-Treatment: Complexity of the patient's impairments (multi-system involvement) PT goals addressed during session: Mobility/safety with mobility OT goals addressed during session: Strengthening/ROM      AM-PAC PT "6 Clicks" Daily Activity     Outcome Measure   Help from another person eating meals?: Total Help from another person taking care of personal grooming?: Total Help from another person toileting, which includes using toliet, bedpan, or urinal?:  Total Help from another person bathing (including washing, rinsing, drying)?: Total Help from another person to put on and taking off regular upper body clothing?: Total Help from another person to put on and taking off regular lower body clothing?: Total 6 Click Score: 6    End of Session    OT Visit Diagnosis: Muscle weakness (generalized) (M62.81);Low vision, both eyes (H54.2);Other symptoms and signs involving the nervous system (R29.898);Other symptoms and signs involving cognitive function;Cognitive communication deficit (R41.841);Hemiplegia and hemiparesis Symptoms and signs involving cognitive functions: Cerebral infarction Hemiplegia - Right/Left:  (both) Hemiplegia - dominant/non-dominant:  (both) Hemiplegia - caused by: Cerebral infarction   Activity Tolerance Patient limited by lethargy   Patient Left in bed   Nurse Communication  (At end of session pt moaning and wheezing as well as IV occulded)        Time: 1610-9604: 0814-0850 OT Time Calculation (min): 36 min  Charges: OT General Charges $OT Visit: 1 Procedure OT Treatments $Therapeutic Activity: 8-22 mins  Ignacia PalmaCathy Tykeria Wawrzyniak, OTR/L [phone] 06/18/2016

## 2016-06-18 NOTE — Plan of Care (Signed)
Problem: Education: Goal: Knowledge of [facility] Education information/materials will improve Outcome: Not Met (add Reason) Pt mental status poorly. Unable to follow commands or receive education.

## 2016-06-18 NOTE — Progress Notes (Signed)
[facility] Progress Note   Subjective: Moaning today.  Transiently hypotensive last night and received a small 250 cc bolus  Objective Vitals:   06/25/2016 2239 06/18/16 0310 06/18/16 0800 06/18/16 1021  BP: (!) 116/50  (!) 88/39 116/78  Pulse: (!) 102  (!) 112 (!) 113  Resp: (!) 28  (!) 30 (!) 31  Temp: 98.3 F (36.8 C) 98.3 F (36.8 C) 98.6 F (37 C) 98.5 F (36.9 C)  TempSrc: Axillary Axillary Axillary Axillary  SpO2: 100%  99% 98%  Weight:      Height:       Physical Exam General: transiently opens eyes HEENT: slightly resists my opening of her eyes bilaterally.  Coretrak in place Heart: tachycardic today, regular rhythm Lungs: CTAB A/P Abdomen: Active BS. Flexaseal with brown stool.  Extremities: Wound vac L thigh. Heel protectors BLE. No edema.  Diffuse sarcopenia Dialysis Access: RIJ TDC. LUA AVG + bruit   Additional Objective Labs: Basic Metabolic Panel:  Recent Labs Lab 06/12/16 0532 06/14/16 0800 06/14/16 1001 06/16/16 0226 06/30/2016 0151  NA  --  129* 134* 132* 132*  K  --  2.8* 2.5* 3.1* 4.1  CL  --  92* 94* 91* 95*  CO2  --  '24 28 26 25  '$ GLUCOSE  --  311* 172* 275* 303*  BUN  --  63* 21* 62* 91*  CREATININE  --  3.27* 1.36* 2.87* 3.56*  CALCIUM  --  8.8* 8.0* 9.0 9.1  PHOS 5.1* 2.6  --   --   --    Liver Function Tests:  Recent Labs Lab 06/12/16 0236 06/14/16 0800  AST 48*  --   ALT 36  --   ALKPHOS 167*  --   BILITOT 1.2  --   PROT 8.3*  --   ALBUMIN 1.8* 1.4*   No results for input(s): LIPASE, AMYLASE in the last 168 hours. CBC:  Recent Labs Lab 06/12/16 0236 06/14/16 0800 06/14/16 1001 06/16/16 0226 06/23/2016 0151  WBC 12.4* 17.7* 18.6* 16.6* 15.2*  HGB 9.0* 6.4* 6.8* 9.5* 8.9*  HCT 30.2* 21.4* 22.5* 30.9* 29.0*  MCV 91.0 89.2 88.9 86.6 87.3  PLT PLATELET CLUMPS NOTED ON SMEAR, COUNT APPEARS ADEQUATE 238 229 244 257   Blood Culture    Component Value Date/Time   SDES WOUND 07/05/2016 1304   SPECREQUEST  LEFT THIGH ABSCESS 06/26/2016 1304   CULT (A)  1304    MULTIPLE ORGANISMS PRESENT, NONE PREDOMINANT NO ANAEROBES ISOLATED    REPTSTATUS 06/14/2016 FINAL 07/07/2016 1304    Cardiac Enzymes: No results for input(s): CKTOTAL, CKMB, CKMBINDEX, TROPONINI in the last 168 hours. CBG:  Recent Labs Lab 06/11/2016 0723 07/09/2016 1129 07/03/2016 2307 06/28/2016 2330 06/18/16 0321  GLUCAP 239* 211* 200* 225* 271*   Studies/Results: Ct Head Wo Contrast  Result Date: 06/18/2016 CLINICAL DATA:  Altered mental status. The patient began spontaneously nonverbal during dialysis 05/30/2016. Acute infarcts on brain MRI 05/27/2016. EXAM: CT HEAD WITHOUT CONTRAST TECHNIQUE: Contiguous axial images were obtained from the base of the skull through the vertex without intravenous contrast. COMPARISON:  Brain MRI and head CT scan 06/05/2016. FINDINGS: Brain: The brain is atrophic with extensive chronic microvascular ischemic change and multiple remote infarcts bilaterally. Large infarct is again seen in the right cerebellum. No evidence of acute infarct, hemorrhage, mass lesion, mass effect, midline shift or abnormal extra-axial fluid collection. No hydrocephalus or pneumocephalus. Vascular: Atherosclerosis noted. Skull: Intact. Sinuses/Orbits: Negative. Other: None. IMPRESSION: No acute abnormality. Age advanced  atrophy and multiple bilateral infarcts as seen on prior exams. Atherosclerosis. Electronically Signed   By: Inge Rise M.D.   On: 06/18/2016 10:24   Ir Removal Tun Cv Cath W/o Fl  Result Date: 06/22/2016 INDICATION: History of end-stage renal disease, now with functional dialysis access. Dialysis catheter was initially placed by Dr. Anselm Pancoast on 05/07/2016 and has functioned well throughout duration of usage. EXAM: REMOVAL OF TUNNELED HEMODIALYSIS CATHETER MEDICATIONS: None COMPLICATIONS: None immediate. PROCEDURE: Informed written consent was obtained from the patient's family following an explanation  of the procedure, risks, benefits and alternatives to treatment. A time out was performed prior to the initiation of the procedure. Maximal barrier sterile technique was utilized including caps, mask, sterile gowns, sterile gloves, large sterile drape, hand hygiene, and Betadine. 1% lidocaine with epinephrine was injected under sterile conditions along the subcutaneous tunnel. Utilizing a combination of blunt dissection and gentle traction, the catheter was removed intact. Hemostasis was obtained with manual compression. A dressing was placed. The patient tolerated the procedure well without immediate post procedural complication. IMPRESSION: Successful removal of tunneled dialysis catheter. Electronically Signed   By: Sandi Mariscal M.D.   On: 06/12/2016 17:07   Medications: . piperacillin-tazobactam (ZOSYN)  IV 3.375 g (06/18/16 1123)  . vancomycin Stopped (06/12/2016 2114)   . aspirin  325 mg Per Tube Daily  . atorvastatin  20 mg Per Tube QPM  . [START ON 06/19/2016] darbepoetin (ARANESP) injection - DIALYSIS  150 mcg Intravenous Q Wed-HD  . feeding supplement (NEPRO CARB STEADY)  1,000 mL Oral Q24H  . feeding supplement (PRO-STAT SUGAR FREE 64)  30 mL Per Tube TID  . FLUoxetine  20 mg Per Tube Daily  . insulin aspart  0-9 Units Subcutaneous Q4H  . insulin glargine  28 Units Subcutaneous QHS  . mouth rinse  15 mL Mouth Rinse q12n4p  . metoprolol tartrate  25 mg Per Tube BID  . Zinc Oxide   Topical BID     Dialysis: [facility]  4:30 hours, 400/800 105.5kg 3K/2Ca TDC R IJ/ maturing LUA AVG Hep 4400 - Mircera 180mg IV q 2 weeks (last given 4/25) - Calcitriol 0.212m PO q HD   Assessment/Plan: 1. Acute multifocal CVAs:Plan per primary.  No vegetations on TEE.  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  ? If recurrent multifocal infarcts are due to hypotensive ischemic events. 2. AMS: poorly responsive, CVA +/- infection 3. L thigh soft-tissue abscess/  infection:sp I&D 4/10 and repeat I&D 5/2 by gen surg. VAC in place, IV abx vanc/ Zosyn.  ID following.  4. Abnormal ECHO: Possible vegetation on L coronary cusp per TTE--> TEE without vegetation 1/6 +bcx's for CNSS was felt to be contaminant most likely per ID.  5. ESRD: MWF HD. Using L arm AVG, HD cath removed 5/7.  4K bath.  Will keep SBP > 105 6. HTN: home coreg on hold, getting q 8hr IV metoprolol.  catapres patch  7. Volume: No recent wt. No LE edema. Running even 8. Anemia: Hgb 9.5rec'd 2u prbc's 06/14/16. Weekly ESA, Aranesp 150 q wednesday. Not candidate for IV Fe with infection/ ^^ferritin.Last tsat 4/19 not calc due to low transferrin. Ferritin 450 >1000>3100 over last 3 mos.  9. Metabolic bone disease: Ca Holding VDRA/ binders for now 10. Nutrition: Alb very low, getting Cortrak TF's. 11. Disposition: Family met with palliative care yesterday. Patient remains a full code. Family has little to no understanding of patient's current condition, believes she will get  better. Now plans are in place for peg placement.  Madelon Lips MD [facility] pgr [phone] 06/18/2016, 11:36 AM

## 2016-06-18 NOTE — Clinical Social Work Note (Signed)
CSW notified SNF admissions coordinator of plan for PEG tomorrow. They can take patient at the earliest on Thursday but need notice so they can order her a bed.  Charlynn CourtSarah Pinky Ravan, CSW [phone]

## 2016-06-18 NOTE — Progress Notes (Signed)
Inpatient Diabetes Program Recommendations  AACE/ADA: New Consensus Statement on Inpatient Glycemic Control (2015)  Target Ranges:  Prepandial:   less than 140 mg/dL      Peak postprandial:   less than 180 mg/dL (1-2 hours)      Critically ill patients:  140 - 180 mg/dL   Results for [name], [name] (MRN [id number]) as of 06/18/2016 09:14  Ref. Range 06/15/2016 04:03 06/11/2016 07:23 07/11/2016 11:29 06/28/2016 23:30 06/18/2016 03:21  Glucose-Capillary Latest Ref Range: 65 - 99 mg/dL 147260 (H) 829239 (H) 562211 (H) 225 (H) 271 (H)   Review of Glycemic Control  Diabetes history: DM 2 Outpatient Diabetes medications: Lantus 14 units QHS, Novolog 0-20 units tid  Current orders for Inpatient glycemic control: Lantus 28 units QHS, Novolog Sensitive (0-9 units) Q4 hours  Inpatient Diabetes Program Recommendations:  Glucose still elevated in the mid 200's. Consider Novolog 3-5 units Tube Feed Coverage Q4 hours.  Thanks,  Christena DeemShannon Dhruvan Gullion RN, MSN, [facility] Inpatient Diabetes Coordinator Team Pager [phone] (8a-5p)

## 2016-06-18 NOTE — Progress Notes (Signed)
Physical Therapy Treatment Patient Details Name: [name] MRN: [id number] DOB: 09/10/1975 Today's Date: 06/18/2016    History of Present Illness Patient is a 41 yo female admitted 2016-06-23 with AMS from Dialysis Center.  Patient with Rt infarcts, occluded MCA, Rt ICA, and Lt vertebral artery. No verbalizations or voluntary movement.   PMH:  CVA with RLE weak, DM, obesity, chronic lymphedema, wound Rt calf, VAC on Lt thigh, HTN, CHF, depression, ESRD on HD, asthma    PT Comments    Pt with minimal response this date to PT/OT session. Pt remains dependent for all mobility. Pt did open eyes to name and demo increased RR with movement of L LE, most likely due to onset of pain. Acute PT to con't to monitor pt.    Follow Up Recommendations  SNF;Supervision/Assistance - 24 hour     Equipment Recommendations  Hospital bed    Recommendations for Other Services Rehab consult;OT consult     Precautions / Restrictions Precautions Precautions: Fall Precaution Comments: wound vac to L inner thigh Restrictions Weight Bearing Restrictions: No    Mobility  Bed Mobility Overal bed mobility: Needs Assistance Bed Mobility: Supine to Sit;Sit to Supine Rolling: +2 for physical assistance;Total assist   Supine to sit: Total assist;+2 for physical assistance Sit to supine: Total assist;+2 for physical assistance   General bed mobility comments: pt did not initiated transfer, total assist for LE mangement and trunk management  Transfers                    Ambulation/Gait                 Stairs            Wheelchair Mobility    Modified Rankin (Stroke Patients Only) Modified Rankin (Stroke Patients Only) Pre-Morbid Rankin Score: Moderate disability Modified Rankin: Severe disability     Balance Overall balance assessment: Needs assistance Sitting-balance support: No upper extremity supported;Feet supported Sitting balance-Leahy Scale: Zero Sitting  balance - Comments: pt sat approx 10 min today with total assist Postural control: Posterior lean                                  Cognition Arousal/Alertness: Lethargic Behavior During Therapy: Flat affect Overall Cognitive Status: Impaired/Different from baseline Area of Impairment: Following commands                       Following Commands: Follows one step commands inconsistently       General Comments: pt with increased RR when L LE movement, most likely due to pain      Exercises      General Comments General comments (skin integrity, edema, etc.): pt wit noted bilat LE discoloration, L inner thigh wound vac      Pertinent Vitals/Pain Pain Assessment: Faces Faces Pain Scale: Hurts even more Pain Location: when tactile cues to L thigh place pt groaning with increased RR, pt with withdrawl to noxious stimuli x 4 extremities Pain Descriptors / Indicators: Moaning;Sore Pain Intervention(s): Monitored during session    Home Living                      Prior Function            PT Goals (current goals can now be found in the care plan section) Acute Rehab PT Goals Patient Stated Goal:  Unable to state PT Goal Formulation: Patient unable to participate in goal setting Time For Goal Achievement: 06/22/16 Potential to Achieve Goals: Poor Progress towards PT goals: Not progressing toward goals - comment    Frequency    Min 2X/week      PT Plan Current plan remains appropriate    Co-evaluation PT/OT/SLP Co-Evaluation/Treatment: Yes Reason for Co-Treatment: Complexity of the patient's impairments (multi-system involvement) PT goals addressed during session: Mobility/safety with mobility        AM-PAC PT "6 Clicks" Daily Activity  Outcome Measure  Difficulty turning over in bed (including adjusting bedclothes, sheets and blankets)?: Total Difficulty moving from lying on back to sitting on the side of the bed? :  Total Difficulty sitting down on and standing up from a chair with arms (e.g., wheelchair, bedside commode, etc,.)?: Total Help needed moving to and from a bed to chair (including a wheelchair)?: Total Help needed walking in hospital room?: Total Help needed climbing 3-5 steps with a railing? : Total 6 Click Score: 6    End of Session   Activity Tolerance: Patient limited by lethargy Patient left: in bed;with bed alarm set;with call bell/phone within reach Nurse Communication: Mobility status PT Visit Diagnosis: Muscle weakness (generalized) (M62.81);Difficulty in walking, not elsewhere classified (R26.2) Pain - Right/Left: Left Pain - part of body: Leg     Time: 1610-9604 PT Time Calculation (min) (ACUTE ONLY): 36 min  Charges:  $Therapeutic Activity: 8-22 mins                    G Codes:       Lewis Shock, PT, DPT Pager #: [phone] Office #: [phone]    [name] 06/18/2016, 11:31 AM

## 2016-06-19 LAB — RENAL FUNCTION PANEL
Albumin: 1.4 g/dL — ABNORMAL LOW (ref 3.5–5.0)
Anion gap: 11 (ref 5–15)
BUN: 54 mg/dL — ABNORMAL HIGH (ref 6–20)
CO2: 27 mmol/L (ref 22–32)
Calcium: 8.7 mg/dL — ABNORMAL LOW (ref 8.9–10.3)
Chloride: 97 mmol/L — ABNORMAL LOW (ref 101–111)
Creatinine, Ser: 2.44 mg/dL — ABNORMAL HIGH (ref 0.44–1.00)
GFR calc Af Amer: 28 mL/min — ABNORMAL LOW (ref >60)
GFR calc non Af Amer: 24 mL/min — ABNORMAL LOW (ref >60)
Glucose, Bld: 157 mg/dL — ABNORMAL HIGH (ref 65–99)
Phosphorus: 2.5 mg/dL (ref 2.5–4.6)
Potassium: 3.3 mmol/L — ABNORMAL LOW (ref 3.5–5.1)
Sodium: 135 mmol/L (ref 135–145)

## 2016-06-19 LAB — GLUCOSE, CAPILLARY
GLUCOSE-CAPILLARY: 214 mg/dL — AB (ref 65–99)
GLUCOSE-CAPILLARY: 158 mg/dL — AB (ref 65–99)
GLUCOSE-CAPILLARY: 242 mg/dL — AB (ref 65–99)
Glucose-Capillary: 366 mg/dL — ABNORMAL HIGH (ref 65–99)
Glucose-Capillary: 185 mg/dL — ABNORMAL HIGH (ref 65–99)
Glucose-Capillary: 241 mg/dL — ABNORMAL HIGH (ref 65–99)

## 2016-06-19 LAB — CBC
HCT: 26.1 % — ABNORMAL LOW (ref 36.0–46.0)
Hemoglobin: 7.7 g/dL — ABNORMAL LOW (ref 12.0–15.0)
MCH: 26.3 pg (ref 26.0–34.0)
MCHC: 29.5 g/dL — ABNORMAL LOW (ref 30.0–36.0)
MCV: 89.1 fL (ref 78.0–100.0)
Platelets: 368 K/uL (ref 150–400)
RBC: 2.93 MIL/uL — ABNORMAL LOW (ref 3.87–5.11)
RDW: 19.8 % — ABNORMAL HIGH (ref 11.5–15.5)
WBC: 13.1 K/uL — ABNORMAL HIGH (ref 4.0–10.5)

## 2016-06-19 LAB — VANCOMYCIN, RANDOM: VANCOMYCIN RM: 29

## 2016-06-19 MED ORDER — VANCOMYCIN HCL IN DEXTROSE 750-5 MG/150ML-% IV SOLN
750.0000 mg | INTRAVENOUS | Status: AC
  Administered 2016-06-19: 750 mg via INTRAVENOUS

## 2016-06-19 MED ORDER — LIDOCAINE HCL (PF) 1 % IJ SOLN: 5.0000 mL | INTRAMUSCULAR | Status: DC | PRN

## 2016-06-19 MED ORDER — LIDOCAINE-PRILOCAINE 2.5-2.5 % EX CREA
1.0000 "application " | TOPICAL_CREAM | CUTANEOUS | Status: DC | PRN
  Filled 2016-06-19: qty 5

## 2016-06-19 MED ORDER — HEPARIN SODIUM (PORCINE) 1000 UNIT/ML DIALYSIS
1000.0000 [IU] | INTRAMUSCULAR | Status: DC | PRN
  Filled 2016-06-19: qty 1

## 2016-06-19 MED ORDER — SODIUM CHLORIDE 0.9 % IV SOLN: 100.0000 mL | INTRAVENOUS | Status: DC | PRN

## 2016-06-19 MED ORDER — VANCOMYCIN HCL IN DEXTROSE 1-5 GM/200ML-% IV SOLN
1000.0000 mg | INTRAVENOUS | Status: DC
  Administered 2016-06-21 – 2016-06-26 (×3): 1000 mg via INTRAVENOUS
  Filled 2016-06-19 (×5): qty 200

## 2016-06-19 MED ORDER — CEFAZOLIN SODIUM-DEXTROSE 2-4 GM/100ML-% IV SOLN
2.0000 g | INTRAVENOUS | Status: DC
  Filled 2016-06-19: qty 100

## 2016-06-19 MED ORDER — VANCOMYCIN HCL IN DEXTROSE 750-5 MG/150ML-% IV SOLN
INTRAVENOUS | Status: AC
  Filled 2016-06-19: qty 150

## 2016-06-19 MED ORDER — DARBEPOETIN ALFA 150 MCG/0.3ML IJ SOSY
PREFILLED_SYRINGE | INTRAMUSCULAR | Status: AC
  Filled 2016-06-19: qty 0.3

## 2016-06-19 MED ORDER — ALTEPLASE 2 MG IJ SOLR: 2.0000 mg | Freq: Once | INTRAMUSCULAR | Status: DC | PRN

## 2016-06-19 MED ORDER — PENTAFLUOROPROP-TETRAFLUOROETH EX AERO: 1.0000 "application " | INHALATION_SPRAY | CUTANEOUS | Status: DC | PRN

## 2016-06-19 MED ORDER — INSULIN GLARGINE 100 UNIT/ML ~~LOC~~ SOLN
30.0000 [IU] | Freq: Every day | SUBCUTANEOUS | Status: AC
  Administered 2016-06-19 – 2016-06-22 (×3): 30 [IU] via SUBCUTANEOUS
  Filled 2016-06-19 (×6): qty 0.3

## 2016-06-19 NOTE — Progress Notes (Signed)
Wound vac changed with surgery PA x2 assist. Black foam x1. Xeroform placed around edges of wound per WOCN. Good seal, minimal leak.  Rectal tube deflated and reinserted and inflated by this RN.

## 2016-06-19 NOTE — Progress Notes (Signed)
[facility] Progress Note   Subjective: given multiple boluses due to hypotension yesterday  Objective Vitals:   06/19/16 0815 06/19/16 0831 06/19/16 0836 06/19/16 0900  BP: (!) 129/53 (!) 126/57 108/79 (!) 141/73  Pulse: (!) 105 (!) 120 (!) 132 (!) 117  Resp: (!) 28 (!) 29 (!) 28 (!) 28  Temp: 98.2 F (36.8 C) 98.2 F (36.8 C)    TempSrc: Oral     SpO2: 100% 94% 96% 96%  Height:       Physical Exam General: transiently opens eyes HEENT: Coretrak in place Heart: tachycardic today, regular rhythm Lungs: CTAB A/P Abdomen: Active BS. Flexaseal with brown stool.  Extremities: Wound vac L thigh. Heel protectors BLE. No edema.  Diffuse sarcopenia Dialysis Access: RIJ TDC. LUA AVG + bruit   Additional Objective Labs: Basic Metabolic Panel:  Recent Labs Lab 06/14/16 0800  06/16/16 0226 06/30/2016 0151 06/19/16 0845  NA 129*  < > 132* 132* 135  K 2.8*  < > 3.1* 4.1 3.3*  CL 92*  < > 91* 95* 97*  CO2 24  < > 26 25 27   GLUCOSE 311*  < > 275* 303* 157*  BUN 63*  < > 62* 91* 54*  CREATININE 3.27*  < > 2.87* 3.56* 2.44*  CALCIUM 8.8*  < > 9.0 9.1 8.7*  PHOS 2.6  --   --   --  2.5  < > = values in this interval not displayed. Liver Function Tests:  Recent Labs Lab 06/14/16 0800 06/19/16 0845  ALBUMIN 1.4* 1.4*   No results for input(s): LIPASE, AMYLASE in the last 168 hours. CBC:  Recent Labs Lab 06/14/16 0800 06/14/16 1001 06/16/16 0226 07/11/2016 0151 06/19/16 0845  WBC 17.7* 18.6* 16.6* 15.2* 13.1*  HGB 6.4* 6.8* 9.5* 8.9* 7.7*  HCT 21.4* 22.5* 30.9* 29.0* 26.1*  MCV 89.2 88.9 86.6 87.3 89.1  PLT 238 229 244 257 368   Blood Culture    Component Value Date/Time   SDES WOUND 07/06/2016 1304   SPECREQUEST LEFT THIGH ABSCESS 06/22/2016 1304   CULT (A) 07/09/2016 1304    MULTIPLE ORGANISMS PRESENT, NONE PREDOMINANT NO ANAEROBES ISOLATED    REPTSTATUS 06/14/2016 FINAL 07/02/2016 1304    Cardiac Enzymes: No results for input(s): CKTOTAL,  CKMB, CKMBINDEX, TROPONINI in the last 168 hours. CBG:  Recent Labs Lab 06/18/16 1239 06/18/16 1538 06/18/16 1906 06/18/16 2320 06/19/16 0428  GLUCAP 319* 343* 366* 366* 214*   Studies/Results: Ct Head Wo Contrast  Result Date: 06/18/2016 CLINICAL DATA:  Altered mental status. The patient began spontaneously nonverbal during dialysis 06/09/2016. Acute infarcts on brain MRI 05/25/2016. EXAM: CT HEAD WITHOUT CONTRAST TECHNIQUE: Contiguous axial images were obtained from the base of the skull through the vertex without intravenous contrast. COMPARISON:  Brain MRI and head CT scan 05/27/2016. FINDINGS: Brain: The brain is atrophic with extensive chronic microvascular ischemic change and multiple remote infarcts bilaterally. Large infarct is again seen in the right cerebellum. No evidence of acute infarct, hemorrhage, mass lesion, mass effect, midline shift or abnormal extra-axial fluid collection. No hydrocephalus or pneumocephalus. Vascular: Atherosclerosis noted. Skull: Intact. Sinuses/Orbits: Negative. Other: None. IMPRESSION: No acute abnormality. Age advanced atrophy and multiple bilateral infarcts as seen on prior exams. Atherosclerosis. Electronically Signed   By: Drusilla Kannerhomas  Dalessio M.D.   On: 06/18/2016 10:24   Ir Removal Tun Cv Cath W/o Fl  Result Date: 07/02/2016 INDICATION: History of end-stage renal disease, now with functional dialysis access. Dialysis catheter was initially placed by  Dr. Lowella Dandy on 05/07/2016 and has functioned well throughout duration of usage. EXAM: REMOVAL OF TUNNELED HEMODIALYSIS CATHETER MEDICATIONS: None COMPLICATIONS: None immediate. PROCEDURE: Informed written consent was obtained from the patient's family following an explanation of the procedure, risks, benefits and alternatives to treatment. A time out was performed prior to the initiation of the procedure. Maximal barrier sterile technique was utilized including caps, mask, sterile gowns, sterile gloves, large  sterile drape, hand hygiene, and Betadine. 1% lidocaine with epinephrine was injected under sterile conditions along the subcutaneous tunnel. Utilizing a combination of blunt dissection and gentle traction, the catheter was removed intact. Hemostasis was obtained with manual compression. A dressing was placed. The patient tolerated the procedure well without immediate post procedural complication. IMPRESSION: Successful removal of tunneled dialysis catheter. Electronically Signed   By: Simonne Come M.D.   On: 06/26/2016 17:07   Medications: . sodium chloride    . sodium chloride    . piperacillin-tazobactam (ZOSYN)  IV Stopped (06/19/16 0110)  . sodium chloride    . vancomycin Stopped (06/29/2016 2114)   . aspirin  325 mg Per Tube Daily  . atorvastatin  20 mg Per Tube QPM  . darbepoetin (ARANESP) injection - DIALYSIS  150 mcg Intravenous Q Wed-HD  . feeding supplement (NEPRO CARB STEADY)  1,000 mL Oral Q24H  . feeding supplement (PRO-STAT SUGAR FREE 64)  30 mL Per Tube TID  . FLUoxetine  20 mg Per Tube Daily  . insulin aspart  0-9 Units Subcutaneous Q4H  . insulin glargine  28 Units Subcutaneous QHS  . mouth rinse  15 mL Mouth Rinse q12n4p  . metoprolol tartrate  25 mg Per Tube BID  . Zinc Oxide   Topical BID     Dialysis: [facility]  4:30 hours, 400/800 105.5kg 3K/2Ca TDC R IJ/ maturing LUA AVG Hep 4400 - Mircera IV q 2 weeks (last given 4/25) - Calcitriol 0.75mcg PO q HD   Assessment/Plan: 1. Acute multifocal CVAs:Plan per primary.  No vegetations on TEE.  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  ? If recurrent multifocal infarcts are due to hypotensive ischemic events.  Her repeat CT shows age-advanced atrophy and no new infarcts. 2. AMS: poorly responsive, CVA +/- infection 3. L thigh soft-tissue abscess/ infection:sp I&D 4/10 and repeat I&D 5/2 by gen surg. VAC in place, IV abx vanc/ Zosyn.  ID following.  4. Abnormal ECHO: Possible  vegetation on L coronary cusp per TTE--> TEE without vegetation 1/6 +bcx's for CNSS was felt to be contaminant most likely per ID.  5. ESRD: MWF HD. Using L arm AVG, HD cath removed 5/7.  4K bath.  Will keep SBP > 105 6. HTN: home coreg on hold, getting q 8hr IV metoprolol.  catapres patch  7. Volume: No recent wt. No LE edema. Running even 8. Anemia: Hgb 9.5rec'd 2u prbc's 06/14/16. Weekly ESA, Aranesp 150 q wednesday. Not candidate for IV Fe with infection/ ^^ferritin.Last tsat 4/19 not calc due to low transferrin. Ferritin 450 >1000>3100 over last 3 mos.  9. Metabolic bone disease: Ca Holding VDRA/ binders for now 10. Nutrition: Alb very low, getting Cortrak TF's. 11. Disposition: Full care for now.  Pall care involved.  Family having a hard time.    Bufford Buttner MD [facility] pgr [phone] 06/19/2016, 10:08 AM

## 2016-06-19 NOTE — Progress Notes (Signed)
[place] TEAM 1 - Stepdown/ICU TEAM  [name]  ONG:[id number] DOB: 08/08/1975 DOA: 06/06/2016 PCP: Marletta LorBarr, Julie, NP    Brief Narrative:  41 y.o.woman with a history of CVA, DM2, morbid obesity, chronic lymphedema, HTN, chronic diastolic heart failure, depression, and admission 3/21 > 4/20 for acute encephalopathy, acute renal failure on CKD 5 requiring HD, decompensated heart failure, and infected leg ulcer.  She was discharged to a SNF.  She had been dialyzing, presumably on MWF.  Upon completion of her HD session on the day of presentation, she was noted to have mental status changes.  It was not clear to the personnel when she was last known normal.  She was transferred to the ED where she had a WBC count of 20, Hgb 9, Ammonia level normal.  Head CT was negative for acute process.  MRI/MRA noted acute right deep gray nuclei and right hippocampal infarcts with an occluded R MCA, Right ICA, and Left vertebral artery.  Subjective: There has been no change in the patient's clinical condition since I last saw her 48 hours ago.  She remains entirely noncommunicative/unresponsive.  Assessment & Plan:  Acute metabolic encephalopathy in setting of chronic encephalopathy During last admit mental status apparently waxed and waned - Psych saw in consultation and suggested tx for depression at that time - her MS is not improving, making it less likely to be connected to her sepsis - I too am concerned as is Palliative Care that this may be a result of her confirmed acute CVAs at presentation - avoiding sedating meds - attempt to resume usual home meds as able via Cortrak tube - consider Neuro re-eval for prognostic purposes if persists - repeat head imaging w/ CT 5/8 noted no new findings   L posterior/medial thigh wound s/p debridement by [facility] Surgy 05/21/16 - completed a course of abx tx on 05/31/16 - CT of the thigh this admit noted a new area c/w an abscess not communicating with the currently  opened area - Gen Surg took her to the OR  - broad abx coverage continues as suggested by ID - wound care per Gen Surgery   ?SBE - Possible 7x276mm vegetation L coronary cusp (per TTE) - ruled out via TEE Blood cx during last admit 1 of 6 + for coag neg Staph strongly suggestive of contaminant - no such mass noted on TTE 03/22/16 - TEE 06/16/2016 w/o evidence of SBE/vegetation - abx per ID    Acute multifocal CVAs with history of multiple prior CVAs bedbound at baseline due to prior CVAs - Neurology has evaluated and since signed off - they felt her AMS was out of proportion to her CVAs - I suspect her CVAs may in fact be the cause of her current AMS  ESRD on HD Nephrology attending to HD M/W/F   Reactive RPR - false + Quant f/u RPR 1:1 w/ negative T pallidum abs - ID feels this is a false +  Morbid obesity - Body mass index is 41.41 kg/m.   Nutrition  Cortrak feeds to continue - IR presently planning for PEG placement   HTN BP controlled - wish to avoid hypotension during HD due to persisting cerebrovascular disease   DM CBG remains elevated beyond goal - adjust treatment and follow  Chronic diastolic congestive heart failure TTE 4/28 noted EF 55-60% w/ grade 1 DD - EDW per HD center is 105.5kg - no signif volume overload at this time - volume management per hemodialysis  DVT prophylaxis: SCDs Code Status: FULL CODE Family Communication: no family present at time of exam  Disposition Plan: SDU  Consultants:  Neurology  Gen Surgery Nephrology   Procedures: none  Antimicrobials:  Zosyn 4/30 > 5/02 + 5/07 > Cefepime 5/02 > 5/06 Vanc 4/30 >   Objective: Blood pressure (!) 124/93, pulse (!) 110, temperature 98.9 F (37.2 C), temperature source Axillary, resp. rate (!) 28, height 5\' 2"  (1.575 m), weight 102.7 kg (226 lb 6.6 oz), SpO2 98 %.  Intake/Output Summary (Last 24 hours) at 06/19/16 1550 Last data filed at 06/19/16 1230  Gross per 24 hour  Intake               960 ml  Output                0 ml  Net              960 ml   Filed Weights    Examination: General: No acute respiratory distress - obtunded  Lungs: Clear to auscultation bilaterally - no wheezing - distant BS  Cardiovascular: Regular rate and rhythm - no appreciable M  Abdomen: Obese, soft, bowel sounds present, no organomegaly, no rebound  Extremities: L medial thigh wound dressed - change of chronic venous statis dermatitis noted B w/o change  Neurologic: Obtunded, no Babinski - does not withdraw from painful stimuli - pupils equal and round but minimally reactive    CBC:  Recent Labs Lab 06/14/16 0800 06/14/16 1001 06/16/16 0226 15-Jul-2016 0151 06/19/16 0845  WBC 17.7* 18.6* 16.6* 15.2* 13.1*  HGB 6.4* 6.8* 9.5* 8.9* 7.7*  HCT 21.4* 22.5* 30.9* 29.0* 26.1*  MCV 89.2 88.9 86.6 87.3 89.1  PLT 238 229 244 257 368   Basic Metabolic Panel:  Recent Labs Lab 06/14/16 0800 06/14/16 1001 06/16/16 0226 2016-07-15 0151 06/19/16 0845  NA 129* 134* 132* 132* 135  K 2.8* 2.5* 3.1* 4.1 3.3*  CL 92* 94* 91* 95* 97*  CO2 24 28 26 25 27   GLUCOSE 311* 172* 275* 303* 157*  BUN 63* 21* 62* 91* 54*  CREATININE 3.27* 1.36* 2.87* 3.56* 2.44*  CALCIUM 8.8* 8.0* 9.0 9.1 8.7*  MG  --   --  2.2 2.3  --   PHOS 2.6  --   --   --  2.5   GFR: Estimated Creatinine Clearance: 34.7 mL/min (A) (by C-G formula based on SCr of 2.44 mg/dL (H)).  Liver Function Tests:  Recent Labs Lab 06/14/16 0800 06/19/16 0845  ALBUMIN 1.4* 1.4*    HbA1C: Hgb A1c MFr Bld  Date/Time Value Ref Range Status  06/09/2016 02:58 AM 6.7 (H) 4.8 - 5.6 % Final    Comment:    (NOTE)         Pre-diabetes: 5.7 - 6.4         Diabetes: >6.4         Glycemic control for adults with diabetes: <7.0   06/08/2016 04:48 AM 6.6 (H) 4.8 - 5.6 % Final    Comment:    (NOTE)         Pre-diabetes: 5.7 - 6.4         Diabetes: >6.4         Glycemic control for adults with diabetes: <7.0     CBG:  Recent Labs Lab  06/18/16 1538 06/18/16 1906 06/18/16 2320 06/19/16 0428 06/19/16 0758  GLUCAP 343* 366* 366* 214* 185*    Scheduled Meds: . aspirin  325 mg Per  Tube Daily  . atorvastatin  20 mg Per Tube QPM  . Darbepoetin Alfa      . darbepoetin (ARANESP) injection - DIALYSIS  150 mcg Intravenous Q Wed-HD  . feeding supplement (NEPRO CARB STEADY)  1,000 mL Oral Q24H  . feeding supplement (PRO-STAT SUGAR FREE 64)  30 mL Per Tube TID  . FLUoxetine  20 mg Per Tube Daily  . insulin aspart  0-9 Units Subcutaneous Q4H  . insulin glargine  28 Units Subcutaneous QHS  . mouth rinse  15 mL Mouth Rinse q12n4p  . metoprolol tartrate  25 mg Per Tube BID  . Zinc Oxide   Topical BID     LOS: 11 days    Lonia Blood, MD [facility] Office  [phone] Pager - Text Page per Amion as per below:  On-Call/Text Page:      [email or web address]      password [email or web address]  If 7PM-7AM, please contact night-coverage [email or web address] Password [email or web address] 06/19/2016, 3:50 PM

## 2016-06-19 NOTE — Procedures (Signed)
Patient seen and examined on Hemodialysis. QB 400 AVG UF goal even.  In sinus tach but keeping BP s over goal.  If HR sustains > 125, will terminate treatment.   Treatment adjusted as needed.  Bufford ButtnerElizabeth Xion Debruyne MD [facility] 10:10 AM

## 2016-06-19 NOTE — Progress Notes (Signed)
[facility] Progress Note  2 Days Post-Op  Subjective: CC: encephalopathy Patient is non-verbal, minimally responsive to painful stimuli.   Objective: Vital signs in last 24 hours: Temp:  [98.2 F (36.8 C)-99.1 F (37.3 C)] 98.9 F (37.2 C) (05/09 1230) Pulse Rate:  [101-132] 110 (05/09 1409) Resp:  [27-32] 28 (05/09 1230) BP: (77-141)/(37-93) 124/93 (05/09 1409) SpO2:  [93 %-100 %] 98 % (05/09 1230) Last BM Date: 06/19/16  Intake/Output from previous day: 05/08 0701 - 05/09 0700 In: 1560 [NG/GT:960; IV Piggyback:350] Out: 100 [Stool:100] Intake/Output this shift: No intake/output data recorded.  PE: Gen: Minimally responsive to pain, non-verbal Left Thigh Wound:   Gently debrided necrotic area with saline and 4x4 gauze, redressed.    Lab Results:   Recent Labs  06/18/2016 0151 06/19/16 0845  WBC 15.2* 13.1*  HGB 8.9* 7.7*  HCT 29.0* 26.1*  PLT 257 368   BMET  Recent Labs  06/14/2016 0151 06/19/16 0845  NA 132* 135  K 4.1 3.3*  CL 95* 97*  CO2 25 27  GLUCOSE 303* 157*  BUN 91* 54*  CREATININE 3.56* 2.44*  CALCIUM 9.1 8.7*   PT/INR No results for input(s): LABPROT, INR in the last 72 hours. CMP     Component Value Date/Time   NA 135 06/19/2016 0845   NA 137 09/16/2013 1301   K 3.3 (L) 06/19/2016 0845   K 4.7 09/16/2013 1301   CL 97 (L) 06/19/2016 0845   CL 108 (H) 09/16/2013 1301   CO2 27 06/19/2016 0845   CO2 24 09/16/2013 1301   GLUCOSE 157 (H) 06/19/2016 0845   GLUCOSE 125 (H) 09/16/2013 1301   BUN 54 (H) 06/19/2016 0845   BUN 15 09/16/2013 1301   CREATININE 2.44 (H) 06/19/2016 0845   CREATININE 1.79 (H) 09/16/2013 1301   CALCIUM 8.7 (L) 06/19/2016 0845   CALCIUM 8.3 (L) 09/16/2013 1301   PROT 8.3 (H) 06/12/2016 0236   PROT 7.0 09/16/2013 1301   ALBUMIN 1.4 (L) 06/19/2016 0845   ALBUMIN 1.3 (L) 09/16/2013 1301   AST 48 (H) 06/12/2016 0236   AST 10 (L) 09/16/2013 1301   ALT 36 06/12/2016 0236   ALT 11 (L) 09/16/2013  1301   ALKPHOS 167 (H) 06/12/2016 0236   ALKPHOS 59 09/16/2013 1301   BILITOT 1.2 06/12/2016 0236   BILITOT 0.2 09/16/2013 1301   GFRNONAA 24 (L) 06/19/2016 0845   GFRNONAA 36 (L) 09/16/2013 1301   GFRAA 28 (L) 06/19/2016 0845   GFRAA 42 (L) 09/16/2013 1301    Studies/Results: Ct Head Wo Contrast  Result Date: 06/18/2016 CLINICAL DATA:  Altered mental status. The patient began spontaneously nonverbal during dialysis 03-15-16. Acute infarcts on brain MRI 03-15-16. EXAM: CT HEAD WITHOUT CONTRAST TECHNIQUE: Contiguous axial images were obtained from the base of the skull through the vertex without intravenous contrast. COMPARISON:  Brain MRI and head CT scan 03-15-16. FINDINGS: Brain: The brain is atrophic with extensive chronic microvascular ischemic change and multiple remote infarcts bilaterally. Large infarct is again seen in the right cerebellum. No evidence of acute infarct, hemorrhage, mass lesion, mass effect, midline shift or abnormal extra-axial fluid collection. No hydrocephalus or pneumocephalus. Vascular: Atherosclerosis noted. Skull: Intact. Sinuses/Orbits: Negative. Other: None. IMPRESSION: No acute abnormality. Age advanced atrophy and multiple bilateral infarcts as seen on prior exams. Atherosclerosis. Electronically Signed   By: Drusilla Kannerhomas  Dalessio M.D.   On: 06/18/2016 10:24   Ir Removal Tun Cv Cath W/o Fl  Result Date: 07/09/2016 INDICATION: History of  end-stage renal disease, now with functional dialysis access. Dialysis catheter was initially placed by Dr. Lowella Dandy on 05/07/2016 and has functioned well throughout duration of usage. EXAM: REMOVAL OF TUNNELED HEMODIALYSIS CATHETER MEDICATIONS: None COMPLICATIONS: None immediate. PROCEDURE: Informed written consent was obtained from the patient's family following an explanation of the procedure, risks, benefits and alternatives to treatment. A time out was performed prior to the initiation of the procedure. Maximal barrier sterile  technique was utilized including caps, mask, sterile gowns, sterile gloves, large sterile drape, hand hygiene, and Betadine. 1% lidocaine with epinephrine was injected under sterile conditions along the subcutaneous tunnel. Utilizing a combination of blunt dissection and gentle traction, the catheter was removed intact. Hemostasis was obtained with manual compression. A dressing was placed. The patient tolerated the procedure well without immediate post procedural complication. IMPRESSION: Successful removal of tunneled dialysis catheter. Electronically Signed   By: Simonne Come M.D.   On: 06-21-2016 17:07    Anti-infectives: Anti-infectives    Start     Dose/Rate Route Frequency Ordered Stop   06/21/16 1200  vancomycin (VANCOCIN) IVPB 1000 mg/200 mL premix     1,000 mg 200 mL/hr over 60 Minutes Intravenous Every M-W-F (Hemodialysis) 06/19/16 1009 07/12/16 1159   06/20/16 0600  ceFAZolin (ANCEF) IVPB 2g/100 mL premix     2 g 200 mL/hr over 30 Minutes Intravenous To Radiology 06/19/16 1427 06/21/16 0600   06/19/16 1200  vancomycin (VANCOCIN) IVPB 750 mg/150 ml premix     750 mg 150 mL/hr over 60 Minutes Intravenous Every M-W-F (Hemodialysis) 06/19/16 1009 06/19/16 1301   06/19/16 1158  Vancomycin (VANCOCIN) 750-5 MG/150ML-% IVPB    Comments:  Herriott, [name]   : cabinet override      06/19/16 1158 06/20/16 0014   2016/06/21 1300  piperacillin-tazobactam (ZOSYN) IVPB 3.375 g     3.375 g 12.5 mL/hr over 240 Minutes Intravenous Every 12 hours 21-Jun-2016 1231 07/08/16 2359   06/14/16 0931  vancomycin (VANCOCIN) 1-5 GM/200ML-% IVPB    Comments:  Matilde Haymaker, [name]   : cabinet override      06/14/16 0931 06/14/16 1113   06/12/16 1700  ceFEPIme (MAXIPIME) 1 g in dextrose 5 % 50 mL IVPB  Status:  Discontinued     1 g 100 mL/hr over 30 Minutes Intravenous Every 24 hours 06/12/16 1648 06-21-16 1205   06/12/16 0959  vancomycin (VANCOCIN) 1-5 GM/200ML-% IVPB    Comments:  Carlyon Prows   : cabinet override        06/12/16 0959 06/12/16 1015   06/10/16 1200  vancomycin (VANCOCIN) IVPB 1000 mg/200 mL premix  Status:  Discontinued     1,000 mg 200 mL/hr over 60 Minutes Intravenous Every M-W-F (Hemodialysis) 06/10/16 0851 06/19/16 1009   06/10/16 1000  piperacillin-tazobactam (ZOSYN) IVPB 3.375 g  Status:  Discontinued     3.375 g 12.5 mL/hr over 240 Minutes Intravenous Every 12 hours 06/10/16 0851 06/12/16 1629   06/10/16 1000  vancomycin (VANCOCIN) 2,250 mg in sodium chloride 0.9 % 500 mL IVPB  Status:  Discontinued     2,250 mg 250 mL/hr over 120 Minutes Intravenous Every 12 hours 06/10/16 0851 06/10/16 0858   06/10/16 1000  vancomycin (VANCOCIN) 2,250 mg in sodium chloride 0.9 % 500 mL IVPB     2,250 mg 250 mL/hr over 120 Minutes Intravenous  Once 06/10/16 0858 06/10/16 1640       Assessment/Plan Left thigh soft tissue infection s/p excision of 15x6x2.5 cm area skin and subcutaneous tissue of left  thigh 05/21/16, Dr. Emelia Loron s/p Excision of skin and subcutaneous tissue of left thigh (25 x 8 x 3 cm), 06/17/2016, Dr. Manus Rudd - afebrile, Leukocytosis improving (13.1 today) - continue VAC changes M/W/F  Acute subcentimeter RIGHT deep gray nuclei and RIGHT hippocampal infarcts Chronically occluded RIGHT internal carotid artery, and LEFT vertebral artery Hypertension ESRD HD - MWF Hypertension Anemia FEN:  Cortrak day 4, PEG placement scheduled for tomorrow by IR ID: Zosyn/Vancomycin 06/10/16, Cefepime stopped 07/01/16, Zosyn resumed 5/7 >> (day #3) DVT: Heparin  Plan: Continue wound vac M/W/F  LOS: 11 days    Wells Guiles , [facility] 06/19/2016, 3:36 PM Pager: [phone] Consults: [phone] Mon-Fri 7:00 am-4:30 pm Sat-Sun 7:00 am-11:30 am

## 2016-06-19 NOTE — Consult Note (Signed)
Chief Complaint: Patient was seen in consultation today for percutaneous gastric tube placement Chief Complaint  Patient presents with  . Altered Mental Status   at the request of Dr Maretta Bees  Referring Physician(s): Dr Maretta Bees  Supervising Physician: Simonne Come  Patient Status: [facility] - In-pt  History of Present Illness: [name] is a 41 y.o. female   ESRD AMS New CVAs Dysphagia; low albumin No response Need for long term care Request for percutaneous gastric tube placement per Dr Sharon Seller Dr Grace Isaac has reviewed imaging and approves procedure  Past Medical History:  Diagnosis Date  . Anasarca 02/05/2015  . Asthma   . CHF (congestive heart failure) (HCC)    diastolic  . CVA (cerebral infarction) 1990's   "writing is not the same since" R leg weakness   . GERD (gastroesophageal reflux disease)   . Hypertension   . Morbid obesity (HCC)   . Palliative care encounter   . Stroke (HCC)   . Type II diabetes mellitus (HCC)     Past Surgical History:  Procedure Laterality Date  . AV FISTULA PLACEMENT Left 05/14/2016   Procedure: ARTERIOVENOUS (AV) LEFT UPPER ARM USING GORETEX STRETCHED VASCULAR GRAFT;  Surgeon: Chuck Hint, MD;  Location: [facility] OR;  Service: Vascular;  Laterality: Left;  . INCISION AND DRAINAGE OF WOUND Left 08/30/2013   thigh necrotic wound/notes 08/30/2013  . INCISION AND DRAINAGE PERIRECTAL ABSCESS Left 06/14/2016   Procedure: DEBRIDEMENT OF LEFT THIGH ABSCESS;  Surgeon: Manus Rudd, MD;  Location: [facility] OR;  Service: General;  Laterality: Left;  . IR GENERIC HISTORICAL  05/07/2016   IR FLUORO GUIDE CV LINE RIGHT 05/07/2016 Richarda Overlie, MD [facility]-INTERV RAD  . IR GENERIC HISTORICAL  05/07/2016   IR US GUIDE VASC ACCESS RIGHT 05/07/2016 Richarda Overlie, MD [facility]INTER[facility]  . IR REMOVAL TUN CV CATH W/O FL  07/05/2016  . IRRIGATION AND DEBRIDEMENT ABSCESS Left 08/30/2013   Procedure: IRRIGATION AND DEBRIDEMENT ABSCESS Left Thigh Necrotic Wound;  Surgeon:  [name], MD;  Location: [facility] OR;  Service: General;  Laterality: Left;  . TEE WITHOUT CARDIOVERSION N/A 09/07/2013   Procedure: TRANSESOPHAGEAL ECHOCARDIOGRAM (TEE);  Surgeon: [name], MD;  Location: [facility] ENDOSCOPY;  Service: Cardiovascular;  Laterality: N/A;  . TEE WITHOUT CARDIOVERSION N/A 02/08/2015   Procedure: TRANSESOPHAGEAL ECHOCARDIOGRAM (TEE);  Surgeon: Chilton Si, MD;  Location: [facility] ENDOSCOPY;  Service: Cardiovascular;  Laterality: N/A;  . TEE WITHOUT CARDIOVERSION N/A 06/22/2016   Procedure: TRANSESOPHAGEAL ECHOCARDIOGRAM (TEE);  Surgeon: Lewayne Bunting, MD;  Location: [facility] ENDOSCOPY;  Service: Cardiovascular;  Laterality: N/A;  . WOUND DEBRIDEMENT Left 05/21/2016   Procedure: DEBRIDEMENT LEFT THIGH WOUND;  Surgeon: Emelia Loron, MD;  Location: [facility] OR;  Service: General;  Laterality: Left;    Allergies: Azithromycin  Medications: Prior to Admission medications   Medication Sig Start Date End Date Taking? Authorizing Provider  Amino Acids-Protein Hydrolys (FEEDING SUPPLEMENT, PRO-STAT SUGAR FREE 64,) LIQD Take 30 mLs by mouth 3 (three) times daily. 05/31/16  Yes Meredeth Ide, MD  atorvastatin (LIPITOR) 20 MG tablet Take 20 mg by mouth every evening.    Yes [provider]  carvedilol (COREG) 25 MG tablet Take 1 tablet (25 mg total) by mouth 2 (two) times daily with a meal. 01/28/16  Yes Hedges, Tinnie Gens, PA-C  DULoxetine (CYMBALTA) 30 MG capsule Take 1 capsule (30 mg total) by mouth daily. 05/31/16  Yes Meredeth Ide, MD  insulin aspart (NOVOLOG) 100 UNIT/ML injection Inject 0-20  Units into the skin 3 (three) times daily with meals. CBG < 70: implement hypoglycemia protocol CBG 70 - 120: 0 units CBG 121 - 150: 3 units CBG 151 - 200: 4 units CBG 201 - 250: 7 units CBG 251 - 300: 11 units CBG 301 - 350: 15 units CBG 351 - 400: 20 units 01/28/16  Yes Hedges, Tinnie GensJeffrey, PA-C  insulin glargine (LANTUS) 100 UNIT/ML injection Inject 0.14 mLs (14 Units total)  into the skin at bedtime. 05/31/16  Yes Meredeth IdeLama, Gagan S, MD  multivitamin (RENA-VIT) TABS tablet Take 1 tablet by mouth at bedtime. 05/17/16  Yes Short, Thea SilversmithMackenzie, MD  nystatin (MYCOSTATIN) 100000 UNIT/ML suspension Take 5 mLs by mouth 4 (four) times daily. For 7 days 06/01/16  Yes [provider]  Probiotic Product (PROBIOTIC PO) Take 1 capsule by mouth daily.   Yes [provider]  senna-docusate (SENOKOT-S) 8.6-50 MG tablet Take 1 tablet by mouth 2 (two) times daily. 05/17/16  Yes Short, Thea SilversmithMackenzie, MD  sevelamer carbonate (RENVELA) 800 MG tablet Take 1 tablet (800 mg total) by mouth 3 (three) times daily with meals. 05/31/16  Yes Meredeth IdeLama, Gagan S, MD  albuterol (PROVENTIL) (2.5 MG/3ML) 0.083% nebulizer solution Take 3 mLs (2.5 mg total) by nebulization every 6 (six) hours as needed for wheezing or shortness of breath. 08/12/15   Charm RingsHonig, Erin J, MD  camphor-menthol [facility](SARNA) lotion Apply 1 application topically every 8 (eight) hours as needed for itching. 03/30/16   Maxie BarbBhandari, Dron Prasad, MD     Family History  Problem Relation Age of Onset  . Diabetes Mother   . Kidney disease Mother     Social History   Social History  . Marital status: Married    Spouse name: N/A  . Number of children: N/A  . Years of education: N/A   Social History Main Topics  . Smoking status: Never Smoker  . Smokeless tobacco: Never Used  . Alcohol use No  . Drug use: No  . Sexual activity: Not Asked   Other Topics Concern  . None   Social History Narrative  . None    Review of Systems: A 12 point ROS discussed and pertinent positives are indicated in the HPI above.  All other systems are negative.  Review of Systems  Constitutional:       On dialysis machine Does open eyes to me  All other systems reviewed and are negative.   Vital Signs: BP (!) 141/73   Pulse (!) 117   Temp 98.2 F (36.8 C)   Resp (!) 28   Ht 5\' 2"  (1.575 m)   Wt 226 lb 6.6 oz (102.7 kg)   SpO2 96%   BMI 41.41 kg/m     Physical Exam  Cardiovascular: Normal rate and regular rhythm.   Pulmonary/Chest: Effort normal and breath sounds normal.  Abdominal: Soft.  Musculoskeletal:  No response Not following any commands  Neurological:  Does open eyes to me No other response  Skin: Skin is warm and dry.  Psychiatric:  Consented with sister Linda CanesChristine via phone  Nursing note and vitals reviewed.   Mallampati Score:  MD Evaluation Airway: Other (comments) Airway comments: no response Heart: WNL Abdomen: WNL Chest/ Lungs: WNL ASA  Classification: 3 Mallampati/Airway Score:  (no response)  Imaging: Dg Abd 1 View  Result Date: 06/10/2016 CLINICAL DATA:  Feeding tube placement. EXAM: ABDOMEN - 1 VIEW COMPARISON:  CT 05/19/2016. FINDINGS: 1039 hours. Feeding tube is looped in the stomach with the tip projecting  over the gastric pylorus or proximal duodenum. The visualized bowel gas pattern is nonobstructive. IMPRESSION: Feeding tube tip projects over the gastric pylorus or proximal duodenum. Electronically Signed   By: Carey Bullocks M.D.   On: 06/10/2016 10:51   Ct Head Wo Contrast  Result Date: 06/18/2016 CLINICAL DATA:  Altered mental status. The patient began spontaneously nonverbal during dialysis 06/03/2016. Acute infarcts on brain MRI 05/22/2016. EXAM: CT HEAD WITHOUT CONTRAST TECHNIQUE: Contiguous axial images were obtained from the base of the skull through the vertex without intravenous contrast. COMPARISON:  Brain MRI and head CT scan 05/13/2016. FINDINGS: Brain: The brain is atrophic with extensive chronic microvascular ischemic change and multiple remote infarcts bilaterally. Large infarct is again seen in the right cerebellum. No evidence of acute infarct, hemorrhage, mass lesion, mass effect, midline shift or abnormal extra-axial fluid collection. No hydrocephalus or pneumocephalus. Vascular: Atherosclerosis noted. Skull: Intact. Sinuses/Orbits: Negative. Other: None. IMPRESSION: No acute  abnormality. Age advanced atrophy and multiple bilateral infarcts as seen on prior exams. Atherosclerosis. Electronically Signed   By: Drusilla Kanner M.D.   On: 06/18/2016 10:24   Ct Head Wo Contrast  Result Date: 05/27/2016 CLINICAL DATA:  41 y/o  F; decreased mental status. EXAM: CT HEAD WITHOUT CONTRAST TECHNIQUE: Contiguous axial images were obtained from the base of the skull through the vertex without intravenous contrast. COMPARISON:  05/24/2016 and 09/05/2013 CT of the head. 09/03/2013 MRI brain. FINDINGS: Brain: Stable small chronic lacunar infarct within the right genu of internal capsule, not well seen on prior CT, but present on the 2015 CT and MRI. No evidence for large territory infarct, intracranial hemorrhage, hydrocephalus, or focal mass effect. Stable chronic lacunar infarct within left thalamus, left lentiform nucleus extending into corona radiata, and within bilateral cerebellar hemispheres. Vascular: Moderate calcific atherosclerosis of the cavernous and paraclinoid internal carotid arteries. Skull: Normal. Negative for fracture or focal lesion. Sinuses/Orbits: No acute finding. Other: None. IMPRESSION: 1. No acute intracranial abnormality identified. 2. Stable chronic basal ganglia and cerebellar hemisphere lacunar infarcts. Electronically Signed   By: Mitzi Hansen M.D.   On: 06/06/2016 20:10   Ct Head Wo Contrast  Result Date: 05/24/2016 CLINICAL DATA:  Acute onset of encephalopathy.  Initial encounter. EXAM: CT HEAD WITHOUT CONTRAST TECHNIQUE: Contiguous axial images were obtained from the base of the skull through the vertex without intravenous contrast. COMPARISON:  CT of the head performed 09/05/2013, and MRI of the brain performed 09/03/2013 FINDINGS: Brain: No evidence of acute infarction, hemorrhage, hydrocephalus, extra-axial collection or mass lesion/mass effect. Chronic infarcts are noted at the cerebellar hemispheres bilaterally, with mild cerebellar atrophy. A  chronic lacunar infarct is noted at the left basal ganglia, extending into the left corona radiata. Mild periventricular white matter change likely reflects small vessel ischemic microangiopathy. The brainstem and fourth ventricle are within normal limits. The cerebral hemispheres demonstrate grossly normal gray-white differentiation. No mass effect or midline shift is seen. Vascular: No hyperdense vessel or unexpected calcification. Skull: There is no evidence of fracture; visualized osseous structures are unremarkable in appearance. Sinuses/Orbits: The orbits are within normal limits. The paranasal sinuses and mastoid air cells are well-aerated. Other: No significant soft tissue abnormalities are seen. IMPRESSION: 1. No acute intracranial pathology seen on CT. 2. Chronic infarcts at the cerebellar hemispheres bilaterally, with mild cerebellar atrophy. 3. Chronic lacunar infarct at the left basal ganglia, extending into the left corona radiata. 4. Mild small vessel ischemic microangiopathy. Electronically Signed   By: Beryle Beams.D.  On: 05/24/2016 21:43   Mr Maxine Glenn Head Wo Contrast  Result Date: 06/30/16 CLINICAL DATA:  Altered mental status, spontaneously nonverbal at dialysis. Similar symptoms previously. History of stroke and RIGHT ICA occlusion, diabetes, end-stage renal disease on dialysis, hypertension. EXAM: MRI HEAD WITHOUT CONTRAST MRA HEAD WITHOUT CONTRAST TECHNIQUE: Multiplanar, multiecho pulse sequences of the brain and surrounding structures were obtained without intravenous contrast. Angiographic images of the head were obtained using MRA technique without contrast. COMPARISON:  CT HEAD 06-30-16 at 1950 hours and MRI of the head September 03, 2013 and CT angiogram of the head and neck September 05, 2013. FINDINGS: Multiple sequences are moderately or severely motion degraded. MRI HEAD FINDINGS Approximately 6 subcentimeter foci of reduced diffusion RIGHT thalamus, RIGHT internal capsule and  RIGHT hippocampus with low ADC values. Faint susceptibility artifact associated with old LEFT basal ganglia infarct. Multiple old cerebellar infarcts. Bold LEFT pontine infarcts, Atrophic midbrain. Mild LEFT cerebral peduncle wallerian degeneration. Old tiny RIGHT posterior frontal lobe infarct. Mild ex vacuo dilatation LEFT lateral ventricle, with generalized mild parenchymal brain volume loss. No hydrocephalus. No midline shift, mass effect or masses. No abnormal extra-axial fluid collections. VASCULAR: See below. SKULL AND UPPER CERVICAL SPINE: No abnormal sellar expansion. No suspicious calvarial bone marrow signal. Craniocervical junction maintained. SINUSES/ORBITS: The mastoid air-cells and included paranasal sinuses are well-aerated. The included ocular globes and orbital contents are non-suspicious. OTHER: Patient is edentulous. MRA HEAD FINDINGS- moderately motion degraded examination resulting in duplicated appearance of the cerebral arteries. ANTERIOR CIRCULATION: Complete lack of flow related enhancement RIGHT internal carotid artery. LEFT internal carotid artery is patent. Stenotic proximal LEFT M1 segment better seen on prior CTA, anterior and LEFT middle cerebral arteries are patent. POSTERIOR CIRCULATION: No significant flow related enhancement LEFT vertebral artery. 2 mm inferiorly directed out patching vertebrobasilar junction. L patching No large vessel occlusion, high-grade stenosis, abnormal luminal irregularity, aneurysm. ANATOMIC VARIANTS: None. IMPRESSION: MRI HEAD: Motion degraded examination. 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 Multiple old infarcts. Moderate parenchymal brain volume loss for age. MRA HEAD: Moderately motion degraded examination. Slow flow versus occluded MCA, likely chronic given prior CT findings. Chronically occluded RIGHT internal carotid artery, and LEFT vertebral artery. Chronically stenotic proximal LEFT M1 segment, difficult to  quantify. 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 Electronically Signed   By: Awilda Metro M.D.   On: 06/30/16 23:49   Mr Brain Wo Contrast  Result Date: 06/30/16 CLINICAL DATA:  Altered mental status, spontaneously nonverbal at dialysis. Similar symptoms previously. History of stroke and RIGHT ICA occlusion, diabetes, end-stage renal disease on dialysis, hypertension. EXAM: MRI HEAD WITHOUT CONTRAST MRA HEAD WITHOUT CONTRAST TECHNIQUE: Multiplanar, multiecho pulse sequences of the brain and surrounding structures were obtained without intravenous contrast. Angiographic images of the head were obtained using MRA technique without contrast. COMPARISON:  CT HEAD 2016/06/30 at 1950 hours and MRI of the head September 03, 2013 and CT angiogram of the head and neck September 05, 2013. FINDINGS: Multiple sequences are moderately or severely motion degraded. MRI HEAD FINDINGS Approximately 6 subcentimeter foci of reduced diffusion RIGHT thalamus, RIGHT internal capsule and RIGHT hippocampus with low ADC values. Faint susceptibility artifact associated with old LEFT basal ganglia infarct. Multiple old cerebellar infarcts. Bold LEFT pontine infarcts, Atrophic midbrain. Mild LEFT cerebral peduncle wallerian degeneration. Old tiny RIGHT posterior frontal lobe infarct. Mild ex vacuo dilatation LEFT lateral ventricle, with generalized mild parenchymal brain volume loss. No hydrocephalus. No midline shift, mass effect  or masses. No abnormal extra-axial fluid collections. VASCULAR: See below. SKULL AND UPPER CERVICAL SPINE: No abnormal sellar expansion. No suspicious calvarial bone marrow signal. Craniocervical junction maintained. SINUSES/ORBITS: The mastoid air-cells and included paranasal sinuses are well-aerated. The included ocular globes and orbital contents are non-suspicious. OTHER: Patient is edentulous. MRA HEAD FINDINGS- moderately motion degraded examination resulting in duplicated appearance of  the cerebral arteries. ANTERIOR CIRCULATION: Complete lack of flow related enhancement RIGHT internal carotid artery. LEFT internal carotid artery is patent. Stenotic proximal LEFT M1 segment better seen on prior CTA, anterior and LEFT middle cerebral arteries are patent. POSTERIOR CIRCULATION: No significant flow related enhancement LEFT vertebral artery. 2 mm inferiorly directed out patching vertebrobasilar junction. L patching No large vessel occlusion, high-grade stenosis, abnormal luminal irregularity, aneurysm. ANATOMIC VARIANTS: None. IMPRESSION: MRI HEAD: Motion degraded examination. 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 Multiple old infarcts. Moderate parenchymal brain volume loss for age. MRA HEAD: Moderately motion degraded examination. Slow flow versus occluded MCA, likely chronic given prior CT findings. Chronically occluded RIGHT internal carotid artery, and LEFT vertebral artery. Chronically stenotic proximal LEFT M1 segment, difficult to quantify. 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 Electronically Signed   By: Awilda Metro M.D.   On: 07-Jul-2016 23:49   Ct Femur Left Wo Contrast  Result Date: 06/09/2016 CLINICAL DATA:  Left posterior thigh wound s/p debridement. Now has climbing WBC and low grade fever as well as concern for endocarditis. EXAM: CT OF THE LEFT FEMUR WITHOUT CONTRAST CT OF THE LEFT TIBIA AND FIBULA WITHOUT CONTRAST TECHNIQUE: Multidetector CT imaging of the left tibia and fibula was performed according to the standard protocol. Multidetector CT imaging of the left femur was performed according to the standard protocol. COMPARISON:  None. FINDINGS: Bones/Joint/Cartilage No acute fracture or dislocation of the left femur. No acute fracture or dislocation of the left tibia or fibula. No periosteal reaction or bone destruction. No aggressive lytic or sclerotic osseous lesion. Normal alignment. No joint effusion.  Tricompartmental mild osteoarthritis of the left knee with small marginal osteophytes. Mild osteoarthritis of the tibiotalar joint. Mild osteoarthritis of the talonavicular joint. Mild enthesopathic changes of the Achilles tendon insertion. Tiny plantar calcaneal spur. Generalized osteopenia. Ligaments Ligaments are suboptimally evaluated by CT. ACL and PCL are grossly intact. Muscles and Tendons Generalized muscle atrophy throughout the thigh and lower leg. Intact quadriceps tendon and patellar tendon. Flexor, extensor, peroneal and Achilles tendons are grossly intact. No intramuscular fluid collection or hematoma. Soft tissue Soft tissue wound in the upper medial left thigh. 1.8 x 3.6 x 4.2 cm complex fluid collection underlying the wound within the subcutaneous fat with surrounding fat stranding most concerning for an abscess or hematoma. Soft tissue wound along the inferior medial left thigh with a wound-vac present. Surrounding skin thickening and fat stranding within the subcutaneous fat most concerning for cellulitis. No soft tissue mass. Extensive peripheral vascular atherosclerotic disease. IMPRESSION: 1. Soft tissue wound along the inferior medial left thigh with a wound-vac present. Surrounding skin thickening and fat stranding within the subcutaneous fat most concerning for cellulitis. No adjacent fluid collection to suggest an abscess. 2. Soft tissue wound in the upper medial left thigh. 1.8 x 3.6 x 4.2 cm complex fluid collection underlying the wound within the subcutaneous fat with surrounding fat stranding most concerning for an abscess or hematoma. This area is just inferior to the inguinal region. 3. No acute osseous abnormality of the left femur, left tibia or left fibula. 4.  Mild tricompartmental osteoarthritis of the left knee. 5. Mild osteoarthritis of the tibiotalar joint and talonavicular joint. Electronically Signed   By: Elige Ko   On: 06/09/2016 17:26   Ct Tibia Fibula Left Wo  Contrast  Result Date: 06/09/2016 CLINICAL DATA:  Left posterior thigh wound s/p debridement. Now has climbing WBC and low grade fever as well as concern for endocarditis. EXAM: CT OF THE LEFT FEMUR WITHOUT CONTRAST CT OF THE LEFT TIBIA AND FIBULA WITHOUT CONTRAST TECHNIQUE: Multidetector CT imaging of the left tibia and fibula was performed according to the standard protocol. Multidetector CT imaging of the left femur was performed according to the standard protocol. COMPARISON:  None. FINDINGS: Bones/Joint/Cartilage No acute fracture or dislocation of the left femur. No acute fracture or dislocation of the left tibia or fibula. No periosteal reaction or bone destruction. No aggressive lytic or sclerotic osseous lesion. Normal alignment. No joint effusion. Tricompartmental mild osteoarthritis of the left knee with small marginal osteophytes. Mild osteoarthritis of the tibiotalar joint. Mild osteoarthritis of the talonavicular joint. Mild enthesopathic changes of the Achilles tendon insertion. Tiny plantar calcaneal spur. Generalized osteopenia. Ligaments Ligaments are suboptimally evaluated by CT. ACL and PCL are grossly intact. Muscles and Tendons Generalized muscle atrophy throughout the thigh and lower leg. Intact quadriceps tendon and patellar tendon. Flexor, extensor, peroneal and Achilles tendons are grossly intact. No intramuscular fluid collection or hematoma. Soft tissue Soft tissue wound in the upper medial left thigh. 1.8 x 3.6 x 4.2 cm complex fluid collection underlying the wound within the subcutaneous fat with surrounding fat stranding most concerning for an abscess or hematoma. Soft tissue wound along the inferior medial left thigh with a wound-vac present. Surrounding skin thickening and fat stranding within the subcutaneous fat most concerning for cellulitis. No soft tissue mass. Extensive peripheral vascular atherosclerotic disease. IMPRESSION: 1. Soft tissue wound along the inferior medial  left thigh with a wound-vac present. Surrounding skin thickening and fat stranding within the subcutaneous fat most concerning for cellulitis. No adjacent fluid collection to suggest an abscess. 2. Soft tissue wound in the upper medial left thigh. 1.8 x 3.6 x 4.2 cm complex fluid collection underlying the wound within the subcutaneous fat with surrounding fat stranding most concerning for an abscess or hematoma. This area is just inferior to the inguinal region. 3. No acute osseous abnormality of the left femur, left tibia or left fibula. 4. Mild tricompartmental osteoarthritis of the left knee. 5. Mild osteoarthritis of the tibiotalar joint and talonavicular joint. Electronically Signed   By: Elige Ko   On: 06/09/2016 17:26   Ir Removal Tun Cv Cath W/o Fl  Result Date: 06/16/2016 INDICATION: History of end-stage renal disease, now with functional dialysis access. Dialysis catheter was initially placed by Dr. Lowella Dandy on 05/07/2016 and has functioned well throughout duration of usage. EXAM: REMOVAL OF TUNNELED HEMODIALYSIS CATHETER MEDICATIONS: None COMPLICATIONS: None immediate. PROCEDURE: Informed written consent was obtained from the patient's family following an explanation of the procedure, risks, benefits and alternatives to treatment. A time out was performed prior to the initiation of the procedure. Maximal barrier sterile technique was utilized including caps, mask, sterile gowns, sterile gloves, large sterile drape, hand hygiene, and Betadine. 1% lidocaine with epinephrine was injected under sterile conditions along the subcutaneous tunnel. Utilizing a combination of blunt dissection and gentle traction, the catheter was removed intact. Hemostasis was obtained with manual compression. A dressing was placed. The patient tolerated the procedure well without immediate post procedural complication. IMPRESSION: Successful  removal of tunneled dialysis catheter. Electronically Signed   By: Simonne Come M.D.    On: 06/26/2016 17:07   Dg Chest Portable 1 View  Result Date: 06-09-16 CLINICAL DATA:  41 year old female for MRI clearance. EXAM: PORTABLE CHEST 1 VIEW COMPARISON:  Chest radiograph dated 05/17/2016 FINDINGS: Right IJ Udall dialysis catheter with tip at the cavoatrial junction. There is mild cardiomegaly. The lungs are clear. There is no pleural effusion or pneumothorax. No acute osseous pathology. No radiopaque metallic object identified. IMPRESSION: 1. No radiopaque metallic foreign object. 2. No acute cardiopulmonary process.  Mild cardiomegaly. 3. Right IJ dialysis catheter with tip at the cavoatrial junction. Electronically Signed   By: Elgie Collard M.D.   On: 09-Jun-2016 22:15   Dg Abd Portable 1v  Result Date: 06/13/2016 CLINICAL DATA:  Nasogastric tube placement. EXAM: PORTABLE ABDOMEN - 1 VIEW COMPARISON:  06/10/2016. FINDINGS: Feeding tube extending into the mid stomach and curving back upon itself with its tip in the proximal stomach. Normal bowel gas pattern. Arterial calcifications. No acute bony abnormality. IMPRESSION: Feeding tube tip in the proximal stomach. Electronically Signed   By: Beckie Salts M.D.   On: 06/13/2016 20:02    Labs:  CBC:  Recent Labs  06/14/16 1001 06/16/16 0226 06/28/2016 0151 06/19/16 0845  WBC 18.6* 16.6* 15.2* 13.1*  HGB 6.8* 9.5* 8.9* 7.7*  HCT 22.5* 30.9* 29.0* 26.1*  PLT 229 244 257 368    COAGS:  Recent Labs  05/07/16 0737 05/08/16 0720  INR 1.70 1.90  APTT 44*  --     BMP:  Recent Labs  06/14/16 1001 06/16/16 0226 07/08/2016 0151 06/19/16 0845  NA 134* 132* 132* 135  K 2.5* 3.1* 4.1 3.3*  CL 94* 91* 95* 97*  CO2 28 26 25 27   GLUCOSE 172* 275* 303* 157*  BUN 21* 62* 91* 54*  CALCIUM 8.0* 9.0 9.1 8.7*  CREATININE 1.36* 2.87* 3.56* 2.44*  GFRNONAA 48* 20* 15* 24*  GFRAA 56* 23* 17* 28*    LIVER FUNCTION TESTS:  Recent Labs  06/09/2016 1745 06/09/16 0258  06/26/2016 0230 06/12/16 0236 06/14/16 0800 06/19/16 0845    BILITOT 1.0 0.8  --  1.9* 1.2  --   --   AST 28 17  --  18 48*  --   --   ALT 30 25  --  22 36  --   --   ALKPHOS 113 101  --  116 167*  --   --   PROT 8.5* 8.0  --  7.9 8.3*  --   --   ALBUMIN 2.1* 1.8*  < > 1.7* 1.8* 1.4* 1.4*  < > = values in this interval not displayed.  TUMOR MARKERS: No results for input(s): AFPTM, CEA, CA199, CHROMGRNA in the last 8760 hours.  Assessment and Plan:  CVA No response Dysphagia; deconditioning Malnutrition Need for long term care Scheduled for percutaneous gastric tube placement in IR Risks and Benefits discussed with the patient's sister [name] including, but not limited to the need for a barium enema during the procedure, bleeding, infection, peritonitis, or damage to adjacent structures. All of her questions were answered; she is agreeable to proceed. Consent signed and in chart.   Thank you for this interesting consult.  I greatly enjoyed meeting [name] and look forward to participating in their care.  A copy of this report was sent to the requesting provider on this date.  Electronically Signed: Ralene Muskrat A 06/19/2016, 10:12 AM  I spent a total of 40 Minutes    in face to face in clinical consultation, greater than 50% of which was counseling/coordinating care for percutaneous gastric tube placement

## 2016-06-19 NOTE — Plan of Care (Signed)
Problem: Bowel/Gastric: Goal: Will not experience complications related to bowel motility Outcome: Not Met (add Reason) Pt having liquid diarrhea. Rectal tube in place.

## 2016-06-19 NOTE — Progress Notes (Signed)
Pharmacy Antibiotic Note  [name] is a 41 y.o. female admitted on 20-Sep-2016 with sepsis.  Vanc + Cefepime abx D#11 for possible sepsis; found with possible vegetation on L coronary cusp on TTE, TEE showed no vegetation. LLE thigh wound - s/p I&D 4/10 and repeat 5/1. Afeb, WBC 13.1 (trending down). Today's pre-HD vancomycin level is 29 (above goal of 15-25 mcg/ml) on 1 gram MWF-HD, most likely explained by previous HD sessions with lowered blood flow rate.   Plan: Decrease today's vancomycin dose to 750 mg with HD Continue vancomycin 1g IV QHD-MWF with Friday's HD session Continue Zosyn 3.375 gm IV q12h (4 hour infusion) Monitor clinical picture, HD sessions, pre-HD level as needed Per ID's note, total length of therapy for 30 days   Height: 5\' 2"  (157.5 cm) Weight:  (bedscale broken) IBW/kg (Calculated) : 50.1  Temp (24hrs), Avg:98.8 F (37.1 C), Min:98.2 F (36.8 C), Max:99.8 F (37.7 C)   Recent Labs Lab 06/14/16 0800 06/14/16 1001 06/16/16 0226 07/09/2016 0151 06/19/16 0845  WBC 17.7* 18.6* 16.6* 15.2* 13.1*  CREATININE 3.27* 1.36* 2.87* 3.56* 2.44*  VANCORANDOM  --   --   --   --  29    Estimated Creatinine Clearance: 34.7 mL/min (A) (by C-G formula based on SCr of 2.44 mg/dL (H)).    Allergies  Allergen Reactions  . Azithromycin Other (See Comments)    Nose bleeding event   Vancomycin 4/30 >> (5/28) Zosyn 4/30>> 5/2; 5/7 >> (5/28) Cefepime 5/2 >> 5/7  4/10 wound culture: mult orgs present, abundant bacteroides ovatus beta lactamase + RPR Reactive: T Pallidum Abx neg, likely false positive RPR 4/30 BCx: ngtd 5/1 LLE abscess: few GNR> mult spec present  Allie BossierApryl Chavy Avera, PharmD PGY1 Pharmacy Resident [phone] (Pager) 06/19/2016 10:17 AM

## 2016-06-20 ENCOUNTER — Encounter (HOSPITAL_COMMUNITY): Payer: Self-pay | Admitting: Interventional Radiology

## 2016-06-20 ENCOUNTER — Inpatient Hospital Stay (HOSPITAL_COMMUNITY): Payer: Medicaid Other

## 2016-06-20 HISTORY — PX: IR GASTROSTOMY TUBE MOD SED: IMG625

## 2016-06-20 LAB — CBC
HCT: 29.5 % — ABNORMAL LOW (ref 36.0–46.0)
Hemoglobin: 8.8 g/dL — ABNORMAL LOW (ref 12.0–15.0)
MCH: 26.8 pg (ref 26.0–34.0)
MCHC: 29.8 g/dL — ABNORMAL LOW (ref 30.0–36.0)
MCV: 89.9 fL (ref 78.0–100.0)
PLATELETS: 343 10*3/uL (ref 150–400)
RBC: 3.28 MIL/uL — AB (ref 3.87–5.11)
RDW: 19.6 % — ABNORMAL HIGH (ref 11.5–15.5)
WBC: 14.8 10*3/uL — ABNORMAL HIGH (ref 4.0–10.5)

## 2016-06-20 LAB — PROTIME-INR
INR: 1.34
PROTHROMBIN TIME: 16.7 s — AB (ref 11.4–15.2)

## 2016-06-20 LAB — GLUCOSE, CAPILLARY
GLUCOSE-CAPILLARY: 211 mg/dL — AB (ref 65–99)
GLUCOSE-CAPILLARY: 158 mg/dL — AB (ref 65–99)
GLUCOSE-CAPILLARY: 164 mg/dL — AB (ref 65–99)
Glucose-Capillary: 125 mg/dL — ABNORMAL HIGH (ref 65–99)
Glucose-Capillary: 148 mg/dL — ABNORMAL HIGH (ref 65–99)
Glucose-Capillary: 168 mg/dL — ABNORMAL HIGH (ref 65–99)

## 2016-06-20 LAB — APTT: aPTT: 39 seconds — ABNORMAL HIGH (ref 24–36)

## 2016-06-20 MED ORDER — GLUCAGON HCL RDNA (DIAGNOSTIC) 1 MG IJ SOLR
INTRAMUSCULAR | Status: AC
Start: 2016-06-20 — End: 2016-06-21
  Filled 2016-06-20: qty 1

## 2016-06-20 MED ORDER — MIDAZOLAM HCL 2 MG/2ML IJ SOLN
INTRAMUSCULAR | Status: AC | PRN
  Administered 2016-06-20: 0.5 mg via INTRAVENOUS

## 2016-06-20 MED ORDER — GLUCAGON HCL RDNA (DIAGNOSTIC) 1 MG IJ SOLR
INTRAMUSCULAR | Status: DC | PRN
  Administered 2016-06-20: 0.25 mg via INTRAVENOUS

## 2016-06-20 MED ORDER — ONDANSETRON HCL 4 MG/2ML IJ SOLN: 4.0000 mg | INTRAMUSCULAR | Status: DC | PRN

## 2016-06-20 MED ORDER — IOPAMIDOL (ISOVUE-300) INJECTION 61%
INTRAVENOUS | Status: AC
  Administered 2016-06-20: 10 mL
  Filled 2016-06-20: qty 50

## 2016-06-20 MED ORDER — FENTANYL CITRATE (PF) 100 MCG/2ML IJ SOLN
INTRAMUSCULAR | Status: AC | PRN
  Administered 2016-06-20: 25 ug via INTRAVENOUS

## 2016-06-20 MED ORDER — MIDAZOLAM HCL 2 MG/2ML IJ SOLN
INTRAMUSCULAR | Status: AC
  Filled 2016-06-20: qty 2

## 2016-06-20 MED ORDER — HYDROCODONE-ACETAMINOPHEN 5-325 MG PO TABS
1.0000 | ORAL_TABLET | ORAL | Status: DC | PRN
  Administered 2016-06-27: 1 via ORAL
  Administered 2016-07-01 – 2016-07-05 (×4): 2 via ORAL
  Administered 2016-07-06 (×2): 1 via ORAL
  Administered 2016-07-07 (×3): 2 via ORAL
  Filled 2016-06-20 (×6): qty 2
  Filled 2016-06-20 (×2): qty 1

## 2016-06-20 MED ORDER — LIDOCAINE HCL 1 % IJ SOLN
INTRAMUSCULAR | Status: AC
Start: 2016-06-20 — End: 2016-06-21
  Filled 2016-06-20: qty 20

## 2016-06-20 MED ORDER — HYDROMORPHONE HCL 1 MG/ML IJ SOLN
1.0000 mg | INTRAMUSCULAR | Status: DC | PRN
  Administered 2016-06-22 – 2016-07-04 (×6): 1 mg via INTRAVENOUS
  Filled 2016-06-20 (×7): qty 1

## 2016-06-20 MED ORDER — CHLORHEXIDINE GLUCONATE 0.12 % MT SOLN
15.0000 mL | Freq: Two times a day (BID) | OROMUCOSAL | Status: DC
  Administered 2016-06-20 – 2016-07-15 (×48): 15 mL via OROMUCOSAL
  Filled 2016-06-20 (×47): qty 15

## 2016-06-20 MED ORDER — FENTANYL CITRATE (PF) 100 MCG/2ML IJ SOLN
INTRAMUSCULAR | Status: AC
  Filled 2016-06-20: qty 2

## 2016-06-20 NOTE — Progress Notes (Signed)
[facility] Progress Note   Subjective: eyes open today.  Doesn't follow commands  Objective Vitals:   06/19/16 1933 06/19/16 2237 06/20/16 0424 06/20/16 0831  BP: (!) 110/50 (!) 120/55 (!) 106/44 128/67  Pulse: (!) 101 93 95 94  Resp: (!) 23 15 (!) 23 (!) 23  Temp: 98.7 F (37.1 C) 98.2 F (36.8 C) 98.5 F (36.9 C) 98.1 F (36.7 C)  TempSrc: Axillary Axillary Axillary Axillary  SpO2: 96% 96% 99% 99%  Height:       Physical Exam General: eyes open HEENT: Coretrak in place Heart: RRR Lungs: CTAB A/P Abdomen: Active BS. Flexaseal with brown stool.  Extremities: Wound vac L thigh. Heel protectors BLE. No edema.  Diffuse sarcopenia  Picture of wound reviewed in epic- appears to have had some necrotic tissue which has debrided Dialysis Access: RIJ TDC. LUA AVG + bruit   Additional Objective Labs: Basic Metabolic Panel:  Recent Labs Lab 06/14/16 0800  06/16/16 0226 07/02/2016 0151 06/19/16 0845  NA 129*  < > 132* 132* 135  K 2.8*  < > 3.1* 4.1 3.3*  CL 92*  < > 91* 95* 97*  CO2 24  < > 26 25 27   GLUCOSE 311*  < > 275* 303* 157*  BUN 63*  < > 62* 91* 54*  CREATININE 3.27*  < > 2.87* 3.56* 2.44*  CALCIUM 8.8*  < > 9.0 9.1 8.7*  PHOS 2.6  --   --   --  2.5  < > = values in this interval not displayed. Liver Function Tests:  Recent Labs Lab 06/14/16 0800 06/19/16 0845  ALBUMIN 1.4* 1.4*   No results for input(s): LIPASE, AMYLASE in the last 168 hours. CBC:  Recent Labs Lab 06/14/16 1001 06/16/16 0226 06/18/2016 0151 06/19/16 0845 06/20/16 0314  WBC 18.6* 16.6* 15.2* 13.1* 14.8*  HGB 6.8* 9.5* 8.9* 7.7* 8.8*  HCT 22.5* 30.9* 29.0* 26.1* 29.5*  MCV 88.9 86.6 87.3 89.1 89.9  PLT 229 244 257 368 343   Blood Culture    Component Value Date/Time   SDES WOUND 07/08/2016 1304   SPECREQUEST LEFT THIGH ABSCESS 06/23/2016 1304   CULT (A) 07/05/2016 1304    MULTIPLE ORGANISMS PRESENT, NONE PREDOMINANT NO ANAEROBES ISOLATED    REPTSTATUS  06/14/2016 FINAL 06/14/2016 1304    Cardiac Enzymes: No results for input(s): CKTOTAL, CKMB, CKMBINDEX, TROPONINI in the last 168 hours. CBG:  Recent Labs Lab 06/19/16 1658 06/19/16 1934 06/19/16 2313 06/20/16 0407 06/20/16 0829  GLUCAP 158* 241* 242* 211* 168*   Studies/Results: Ct Head Wo Contrast  Result Date: 06/18/2016 CLINICAL DATA:  Altered mental status. The patient began spontaneously nonverbal during dialysis 06/13/2016. Acute infarcts on brain MRI Jun 13, 2016. EXAM: CT HEAD WITHOUT CONTRAST TECHNIQUE: Contiguous axial images were obtained from the base of the skull through the vertex without intravenous contrast. COMPARISON:  Brain MRI and head CT scan Jun 13, 2016. FINDINGS: Brain: The brain is atrophic with extensive chronic microvascular ischemic change and multiple remote infarcts bilaterally. Large infarct is again seen in the right cerebellum. No evidence of acute infarct, hemorrhage, mass lesion, mass effect, midline shift or abnormal extra-axial fluid collection. No hydrocephalus or pneumocephalus. Vascular: Atherosclerosis noted. Skull: Intact. Sinuses/Orbits: Negative. Other: None. IMPRESSION: No acute abnormality. Age advanced atrophy and multiple bilateral infarcts as seen on prior exams. Atherosclerosis. Electronically Signed   By: Drusilla Kanner M.D.   On: 06/18/2016 10:24   Medications: .  ceFAZolin (ANCEF) IV    . piperacillin-tazobactam (ZOSYN)  IV 3.375 g (06/20/16 0914)  . sodium chloride    . [START ON 06/21/2016] vancomycin     . aspirin  325 mg Per Tube Daily  . atorvastatin  20 mg Per Tube QPM  . darbepoetin (ARANESP) injection - DIALYSIS  150 mcg Intravenous Q Wed-HD  . feeding supplement (NEPRO CARB STEADY)  1,000 mL Oral Q24H  . feeding supplement (PRO-STAT SUGAR FREE 64)  30 mL Per Tube TID  . FLUoxetine  20 mg Per Tube Daily  . insulin aspart  0-9 Units Subcutaneous Q4H  . insulin glargine  30 Units Subcutaneous QHS  . mouth rinse  15 mL Mouth  Rinse q12n4p  . metoprolol tartrate  25 mg Per Tube BID  . Zinc Oxide   Topical BID     Dialysis: [facility]  4:30 hours, 400/800 105.5kg 3K/2Ca TDC R IJ/ maturing LUA AVG Hep 4400 - Mircera 150mcg IV q 2 weeks (last given 4/25) - Calcitriol 0.3825mcg PO q HD   Assessment/Plan: 1. Acute multifocal CVAs:Plan per primary.  No vegetations on TEE.  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  ? If recurrent multifocal infarcts are due to hypotensive ischemic events.  Her repeat CT shows age-advanced atrophy and no new infarcts. 2. AMS: poorly responsive, CVA +/- infection.  Would consider neuro eval 3. L thigh soft-tissue abscess/ infection:sp I&D 4/10 and repeat I&D 5/2 by gen surg. VAC in place, IV abx vanc/ Zosyn.  ID following.  4. Abnormal ECHO: Possible vegetation on L coronary cusp per TTE--> TEE without vegetation 1/6 +bcx's for CNSS was felt to be contaminant most likely per ID.  5. ESRD: MWF HD. Using L arm AVG, HD cath removed 5/7.  4K bath.  Will keep SBP > 105 6. HTN: home coreg on hold, getting q 8hr IV metoprolol.  catapres patch  7. Volume: No recent wt. No LE edema. Running even 8. Anemia: Hgb 9.5rec'd 2u prbc's 06/14/16. Weekly ESA, Aranesp 150 q wednesday. Not candidate for IV Fe with infection/ ^^ferritin.Last tsat 4/19 not calc due to low transferrin. Ferritin 450 >1000>3100 over last 3 mos.  9. Metabolic bone disease: Ca Holding VDRA/ binders for now 10. Nutrition: Alb very low, getting Cortrak TF's. 11. Disposition: Full care for now.  Pall care involved.  Family having a hard time.    Bufford ButtnerElizabeth Aloni Chuang MD [facility] pgr [phone] 06/20/2016, 9:37 AM

## 2016-06-20 NOTE — Progress Notes (Signed)
Orthopedic Tech Progress Note Patient Details:  Linda DykesDavenia McKinnon [name] 11/16/1975 [id number]  Ortho Devices Type of Ortho Device: Abdominal binder Ortho Device/Splint Interventions: Ordered, Application   Jennye MoccasinHughes, [name] 06/20/2016, 7:44 PM

## 2016-06-20 NOTE — Progress Notes (Signed)
Occupational Therapy Treatment Patient Details Name: [name] MRN: [id number] DOB: 08/22/1975 Today's Date: 06/20/2016    History of present illness Patient is a 41 yo female admitted 06/06/2016 with AMS from [facility].  Patient with Rt infarcts, occluded MCA, Rt ICA, and Lt vertebral artery. No verbalizations or voluntary movement.   PMH:  CVA with RLE weak, DM, obesity, chronic lymphedema, wound Rt calf, VAC on Lt thigh, HTN, CHF, depression, ESRD on HD, asthma   OT comments  Pt alert this session with eyes open throughout but continues to not follow command or verbalize. Pt required total assist for LB bathing and UB dressing at bed level. Total assist +2 was required for bed mobility and hand over hand assist for washing face in sitting. D/c plan remains appropriate. Will continue to follow acutely.   Follow Up Recommendations  SNF;Supervision/Assistance - 24 hour    Equipment Recommendations  Other (comment) (TBD at next venue)    Recommendations for Other Services      Precautions / Restrictions Precautions Precautions: Fall Restrictions Weight Bearing Restrictions: No       Mobility Bed Mobility Overal bed mobility: Needs Assistance Bed Mobility: Supine to Sit;Sit to Supine;Rolling Rolling: Total assist;+2 for physical assistance   Supine to sit: Total assist;+2 for physical assistance Sit to supine: Total assist;+2 for physical assistance   General bed mobility comments: Pt holding on bed rail in sidelying with R hand with max cues and placement. Total assist for all aspects of bed mobility today-no initiation  Transfers                      Balance Overall balance assessment: Needs assistance Sitting-balance support: Feet supported;Bilateral upper extremity supported Sitting balance-Leahy Scale: Zero                                     ADL either performed or assessed with clinical judgement   ADL Overall ADL's : Needs  assistance/impaired     Grooming: Wash/dry face;Total assistance;Sitting Grooming Details (indicate cue type and reason): hand over hand assist to wash face/mouth     Lower Body Bathing: Total assistance;Bed level   Upper Body Dressing : Total assistance;Bed level           Toileting- Clothing Manipulation and Hygiene: Total assistance;Bed level       Functional mobility during ADLs: Total assistance;+2 for physical assistance (for bed mobility) General ADL Comments: Pt not following commands today. Disconjugate gaze and not tracking to command.     Vision       Perception     Praxis      Cognition Arousal/Alertness: Awake/alert Behavior During Therapy: Flat affect Overall Cognitive Status: Difficult to assess                                 General Comments: Pt not verbally communicating or following commands.        Exercises     Shoulder Instructions       General Comments      Pertinent Vitals/ Pain       Pain Assessment: Faces Faces Pain Scale: Hurts little more Pain Location: generalized with movement Pain Descriptors / Indicators: Grimacing;Moaning Pain Intervention(s): Limited activity within patient's tolerance  Home Living  Prior Functioning/Environment              Frequency  Min 2X/week        Progress Toward Goals  OT Goals(current goals can now be found in the care plan section)  Progress towards OT goals: Not progressing toward goals - comment (not following commands-total assist)  Acute Rehab OT Goals Patient Stated Goal: none stated OT Goal Formulation: With patient  Plan Discharge plan remains appropriate    Co-evaluation    PT/OT/SLP Co-Evaluation/Treatment: Yes Reason for Co-Treatment: Complexity of the patient's impairments (multi-system involvement);Necessary to address cognition/behavior during functional activity;For patient/therapist  safety   OT goals addressed during session: ADL's and self-care      AM-PAC PT "6 Clicks" Daily Activity     Outcome Measure   Help from another person eating meals?: Total Help from another person taking care of personal grooming?: Total Help from another person toileting, which includes using toliet, bedpan, or urinal?: Total Help from another person bathing (including washing, rinsing, drying)?: Total Help from another person to put on and taking off regular upper body clothing?: Total Help from another person to put on and taking off regular lower body clothing?: Total 6 Click Score: 6    End of Session Equipment Utilized During Treatment: Other (comment) (wound vac)  OT Visit Diagnosis: Muscle weakness (generalized) (M62.81);Low vision, both eyes (H54.2);Other symptoms and signs involving the nervous system (R29.898);Other symptoms and signs involving cognitive function;Cognitive communication deficit (R41.841);Hemiplegia and hemiparesis Symptoms and signs involving cognitive functions: Cerebral infarction Hemiplegia - caused by: Cerebral infarction   Activity Tolerance Patient tolerated treatment well   Patient Left in bed   Nurse Communication Mobility status        Time: 1025-1100 OT Time Calculation (min): 35 min  Charges: OT General Charges $OT Visit: 1 Procedure OT Treatments $Self Care/Home Management : 8-22 mins  Harold Mattes A. Brett Albino, M.S., OTR/L Pager: [phone]   Gaye Alken 06/20/2016, 1:20 PM

## 2016-06-20 NOTE — Progress Notes (Signed)
Inpatient Diabetes Program Recommendations  AACE/ADA: New Consensus Statement on Inpatient Glycemic Control (2015)  Target Ranges:  Prepandial:   less than 140 mg/dL      Peak postprandial:   less than 180 mg/dL (1-2 hours)      Critically ill patients:  140 - 180 mg/dL   Review of Glycemic Control  Diabetes history: DM 2 Outpatient Diabetes medications: Lantus 14 units QHS, Novolog 0-20 units tid  Current orders for Inpatient glycemic control: Lantus 30 units QHS, Novolog Sensitive (0-9 units) Q4 hours  Inpatient Diabetes Program Recommendations:  Glucose still elevated in the mid 200's. Consider Novolog 3-5 units Tube Feed Coverage Q4 hours.  Thanks,  Christena DeemShannon Tajay Muzzy RN, MSN, [facility] Inpatient Diabetes Coordinator Team Pager [phone] (8a-5p)

## 2016-06-20 NOTE — Progress Notes (Signed)
Nutrition Follow-up  DOCUMENTATION CODES:   Morbid obesity  INTERVENTION:   When PEG placed, continue:   Nepro at 40 ml/h with Pro-stat 30 ml TID to provide 2028 kcal, 123 gm protein, 698 ml free water daily.  NUTRITION DIAGNOSIS:   Inadequate oral intake related to lethargy/confusion as evidenced by NPO status.  Ongoing  GOAL:   Patient will meet greater than or equal to 90% of their needs   Met with TF  MONITOR:   Diet advancement, PO intake, Weight trends, I & O's, TF tolerance  ASSESSMENT:   41 y.o.woman (SNF resident) with PMH of CVA, DM2, morbid obesity, chronic lymphedema, HTN, chronic diastolic heart failure, depression, ESRD on HD, and infected leg ulcer. Admitted on 4/27 after her HD session with mental status changes.    Cortrak tube re-placed 5/7 (tip in the stomach). PEG to be placed today. Patient is currently receiving Nepro via Cortrak tube at 40 ml/h (960 ml/day) with Prostat 30 ml TID to provide 2028 kcals, 123 gm protein, 698 ml free water daily. Tolerating TF well. VAC in place to L thigh for soft tissue abscess / infection. Labs reviewed: phosphorus 2.5 (WNL), potassium 3.3 (L) CBG's: 211-168 Medications reviewed.  Diet Order:  Diet NPO time specified  Skin:  L thigh abscess; wounds to R leg, sacrum, stg 2 buttocks; DTI R heel; unstageable L heel  Last BM:  5/9  Height:   Ht Readings from Last 1 Encounters:  06/09/16 '5\' 2"'$  (1.575 m)    Weight:   Wt Readings from Last 1 Encounters:  05/31/16 297 lb 9.9 oz (135 kg)    Ideal Body Weight:  50 kg  BMI:  Body mass index is 41.41 kg/m.  Estimated Nutritional Needs:   Kcal:  1900-2100  Protein:  110-130 gm  Fluid:  >/= 2.1 L  EDUCATION NEEDS:   No education needs identified at this time  Molli Barrows, [place], [place], [place] Pager [phone] After Hours Pager [phone]

## 2016-06-20 NOTE — Progress Notes (Signed)
Physical Therapy Treatment Patient Details Name: [name] MRN: [id number] DOB: 06-11-1975 Today's Date: 06/20/2016    History of Present Illness Patient is a 41 yo female admitted 05/25/2016 with AMS from Dialysis Center.  Patient with Rt infarcts, occluded MCA, Rt ICA, and Lt vertebral artery. No verbalizations or voluntary movement.   PMH:  CVA with RLE weak, DM, obesity, chronic lymphedema, wound Rt calf, VAC on Lt thigh, HTN, CHF, depression, ESRD on HD, asthma    PT Comments    Patient continues to require +2 total A for all mobility. Pt with eyes open most of session but unable to follow commands. Current d/c plan remains appropriate.    Follow Up Recommendations  SNF;Supervision/Assistance - 24 hour     Equipment Recommendations  Hospital bed    Recommendations for Other Services Rehab consult;OT consult     Precautions / Restrictions Precautions Precautions: Fall Restrictions Weight Bearing Restrictions: No    Mobility  Bed Mobility Overal bed mobility: Needs Assistance Bed Mobility: Supine to Sit;Sit to Supine;Rolling Rolling: Total assist;+2 for physical assistance   Supine to sit: Total assist;+2 for physical assistance Sit to supine: Total assist;+2 for physical assistance   General bed mobility comments: assist for all aspects of bed mobility with multimodal cues for hand placement and sequencing however pt not responding to commands; pt able to grasp hand rail with R hand and assist in maintaining sidelying toward L side  Transfers                    Ambulation/Gait                 Stairs            Wheelchair Mobility    Modified Rankin (Stroke Patients Only)       Balance Overall balance assessment: Needs assistance Sitting-balance support: Feet supported;Bilateral upper extremity supported Sitting balance-Leahy Scale: Zero                                      Cognition Arousal/Alertness:  Awake/alert Behavior During Therapy: Flat affect Overall Cognitive Status: Difficult to assess                                 General Comments: Pt not verbally communicating or following commands.      Exercises      General Comments        Pertinent Vitals/Pain Pain Assessment: Faces Faces Pain Scale: Hurts little more Pain Location: generalized with movement Pain Descriptors / Indicators: Grimacing;Moaning Pain Intervention(s): Limited activity within patient's tolerance    Home Living                      Prior Function            PT Goals (current goals can now be found in the care plan section) Acute Rehab PT Goals Patient Stated Goal: none stated Progress towards PT goals: Not progressing toward goals - comment    Frequency    Min 2X/week      PT Plan Current plan remains appropriate    Co-evaluation PT/OT/SLP Co-Evaluation/Treatment: Yes Reason for Co-Treatment: Complexity of the patient's impairments (multi-system involvement);Necessary to address cognition/behavior during functional activity;For patient/therapist safety   OT goals addressed during session: ADL's and self-care  AM-PAC PT "6 Clicks" Daily Activity  Outcome Measure  Difficulty turning over in bed (including adjusting bedclothes, sheets and blankets)?: Total Difficulty moving from lying on back to sitting on the side of the bed? : Total Difficulty sitting down on and standing up from a chair with arms (e.g., wheelchair, bedside commode, etc,.)?: Total Help needed moving to and from a bed to chair (including a wheelchair)?: Total Help needed walking in hospital room?: Total Help needed climbing 3-5 steps with a railing? : Total 6 Click Score: 6    End of Session   Activity Tolerance: Patient limited by lethargy Patient left: in bed;with call bell/phone within reach;with bed alarm set Nurse Communication: Mobility status PT Visit Diagnosis: Muscle  weakness (generalized) (M62.81);Difficulty in walking, not elsewhere classified (R26.2) Pain - Right/Left: Left Pain - part of body: Leg     Time: 1025-1100 PT Time Calculation (min) (ACUTE ONLY): 35 min  Charges:  $Therapeutic Activity: 8-22 mins                    G Codes:       Erline LevineKellyn Shabana Armentrout, PTA Pager: [phone]     Linda EdouardKellyn R Jailen Coward 06/20/2016, 2:07 PM

## 2016-06-20 NOTE — Plan of Care (Signed)
Problem: Education: Goal: Knowledge of [facility] Education information/materials will improve Outcome: Not Progressing Patient not showing any ability to comprehend education at this time apparently  Due to stroke.

## 2016-06-20 NOTE — Procedures (Signed)
21F gastrostomy tube placement under fluoro No complication No blood loss. See complete dictation in [facility] PACS.  

## 2016-06-20 NOTE — Clinical Social Work Note (Signed)
Per MD, patient will likely be stable for discharge Sunday or Monday due to IV antibiotics for wound and surgery needing to sign off on her. SNF admissions coordinator notified but stated they cannot take over the weekend due to medical complexity. They can take on Monday.  Charlynn CourtSarah Allyne Hebert, CSW [phone]

## 2016-06-20 NOTE — Progress Notes (Signed)
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  She was discharged to a SNF.  She had been dialyzing, presumably on MWF.  Upon completion of her HD session on the day of presentation, she was noted to have mental status changes.  It was not clear to the personnel when she was last known normal.  She was transferred to the ED where she had a WBC count of 20, Hgb 9, Ammonia level normal.  Head CT was negative for acute process.  MRI/MRA noted acute right deep gray nuclei and right hippocampal infarcts with an occluded R MCA, Right ICA, and Left vertebral artery.  Subjective:  Doesn't communicate. She opens her eyes today but doesn't track. Remains afebrile overnight. Her blood pressure has been more stable over last 24 hours.  Assessment & Plan:  Acute metabolic encephalopathy in setting of chronic encephalopathy During last admit mental status apparently waxed and waned - Psych saw in consultation and suggested tx for depression  - suspect her acute change is due to TME in setting of sepsis related to her L thigh wound and possible SBE -Cont vanc and zosyn now  -Discontinued clonidine due to sedative effects. Will use other antihypertensive agents - cont supportive care  -TEE done on 5/7 - no vegetations seen.  -Appreciate palliative care input  L posterior/medial thigh wound s/p debridement by Len Childs 05/21/16 - completed a course of abx tx on 05/31/16  - CT of the thigh has noted a new area c/w an abscess not communicating with the currently opened area  - Gen Surgery- Excision of skin and subcutaneous tissue of left  thigh (25 x 8 x 3 cm) on 5/1 - broad abx coverage to include MRSA coverage to continue   SBE - Possible 7x73mm vegetation L coronary cusp  Blood cx during last admit 1 of 6 + for coag neg Staph strongly suggestive of contaminant - no such mass noted on TTE 03/22/16   Acute, multifocal CVAs with history of multiple prior CVAs Concerns for cardiac lesion as seen on echocardiogram which could likely be the source of her stroke TEE done on 5/7 - no vegetations seen.   ESRD on HD Nephrology following - for HD M/W/F  TLC out 5/7. Graft functioning well   Anemia likely from chronic disease  -no signs of acute blood loss -Will order 2 units PRBC (5/4)  Reactive RPR Quant f/u RPR 1:1 w/ negative T pallidum abs - ?false negative - seen by ID  Morbid obesity - Body mass index is 41.41 kg/m.   Nutrition  Cortrak for nutrition. Family okay with PEG Plans for Peg placement today   HTN -We'll discontinue her clonidine at this time due to possible sedative effects -hold antihypertensive this morning. Will give lopressor or hydralazine IV if needed   DM2 CBG elevated,  Cont Lantus. Resistant ISS.   Chronic diastolic congestive heart failure TTE 4/28 noted EF 55-60% w/ grade 1 DD - EDW per HD center is 105.5kg  Filed Weights     DVT prophylaxis: SCDs Code Status: FULL CODE Family Communication: no family present at time of exam.  Disposition Plan: SDU  Consultants:  Neurology  Gen Surgery Nephrology   Procedures: none  Antimicrobials:  Zosyn 4/30 >5/2 Vanc 4/30 > Cefepime 5/2 >5/7 Zosyn 5/7  Objective: Blood pressure 128/67, pulse 94, temperature 98.1 F (36.7 C), temperature source Axillary, resp. rate (!) 23, height 5\' 2"  (1.575 m), weight 102.7 kg (226 lb 6.6 oz), SpO2 99 %.  Intake/Output Summary (Last 24 hours) at 06/20/16 0959 Last data filed at 06/20/16 0000  Gross per 24 hour  Intake           440.67 ml  Output              300 ml  Net           140.67 ml    Filed Weights    Examination: General: No acute respiratory distress - obtunded  Lungs: Clear to auscultation bilaterally without wheeze - distant BS related to body habitus  Cardiovascular: Regular rate and rhythm without murmur - distant HS  Abdomen: Obese, soft, bowel sounds present, no organomegaly, no rebound - Cortrak in L nare Extremities: L medial thigh wound dressed and presently dry - change of chronic venous statis dermatitis noted B Neurologic: Obtunded, no Babinski - does not withdraw from painful stimuli - opens eyes intermittently but does not follow examiner    CBC:  Recent Labs Lab 06/14/16 1001 06/16/16 0226 06/16/2016 0151 06/19/16 0845 06/20/16 0314  WBC 18.6* 16.6* 15.2* 13.1* 14.8*  HGB 6.8* 9.5* 8.9* 7.7* 8.8*  HCT 22.5* 30.9* 29.0* 26.1* 29.5*  MCV 88.9 86.6 87.3 89.1 89.9  PLT 229 244 257 368 343   Basic Metabolic Panel:  Recent Labs Lab 06/14/16 0800 06/14/16 1001 06/16/16 0226 06/30/2016 0151 06/19/16 0845  NA 129* 134* 132* 132* 135  K 2.8* 2.5* 3.1* 4.1 3.3*  CL 92* 94* 91* 95* 97*  CO2 24 28 26 25 27   GLUCOSE 311* 172* 275* 303* 157*  BUN 63* 21* 62* 91* 54*  CREATININE 3.27* 1.36* 2.87* 3.56* 2.44*  CALCIUM 8.8* 8.0* 9.0 9.1 8.7*  MG  --   --  2.2 2.3  --   PHOS 2.6  --   --   --  2.5   GFR: Estimated Creatinine Clearance: 34.7 mL/min (A) (by C-G formula based on SCr of 2.44 mg/dL (H)).  Liver Function Tests:  Recent Labs Lab 06/14/16 0800 06/19/16 0845  ALBUMIN 1.4* 1.4*   No results for input(s): AMMONIA in the last 168 hours.  HbA1C: Hgb A1c MFr Bld  Date/Time Value Ref Range Status  06/09/2016 02:58 AM 6.7 (H) 4.8 - 5.6 % Final    Comment:    (NOTE)         Pre-diabetes: 5.7 - 6.4         Diabetes: >6.4         Glycemic control for adults with diabetes: <7.0   06/08/2016 04:48 AM 6.6 (H) 4.8 - 5.6 % Final    Comment:    (NOTE)         Pre-diabetes: 5.7 - 6.4         Diabetes: >6.4         Glycemic control for  adults with diabetes: <7.0     CBG:  Recent Labs Lab 06/19/16 1658 06/19/16 1934 06/19/16 2313 06/20/16 0407 06/20/16 0829  GLUCAP 158* 241* 242* 211* 168*    Scheduled Meds: . aspirin  325 mg Per Tube Daily  . atorvastatin  20 mg Per Tube QPM  . darbepoetin (ARANESP) injection -  DIALYSIS  150 mcg Intravenous Q Wed-HD  . feeding supplement (NEPRO CARB STEADY)  1,000 mL Oral Q24H  . feeding supplement (PRO-STAT SUGAR FREE 64)  30 mL Per Tube TID  . FLUoxetine  20 mg Per Tube Daily  . insulin aspart  0-9 Units Subcutaneous Q4H  . insulin glargine  30 Units Subcutaneous QHS  . mouth rinse  15 mL Mouth Rinse q12n4p  . metoprolol tartrate  25 mg Per Tube BID  . Zinc Oxide   Topical BID     LOS: 12 days    Stephania Fragmin, MD [facility] Office  [phone] Pager - Text Page per Loretha Stapler as per below:  On-Call/Text Page:      [email or web address]      password [email or web address]  If 7PM-7AM, please contact night-coverage [email or web address] Password [email or web address] 06/20/2016, 9:59 AM

## 2016-06-20 NOTE — Plan of Care (Signed)
Problem: Nutrition: Goal: Adequate nutrition will be maintained Outcome: Progressing Patient currently NPO, but receiving tube feeds.  Problem: Bowel/Gastric: Goal: Will not experience complications related to bowel motility Outcome: Not Progressing Patient still with very loose stools, flexiseal remains in place.

## 2016-06-21 LAB — GLUCOSE, CAPILLARY
GLUCOSE-CAPILLARY: 134 mg/dL — AB (ref 65–99)
Glucose-Capillary: 138 mg/dL — ABNORMAL HIGH (ref 65–99)
Glucose-Capillary: 135 mg/dL — ABNORMAL HIGH (ref 65–99)
Glucose-Capillary: 139 mg/dL — ABNORMAL HIGH (ref 65–99)
Glucose-Capillary: 158 mg/dL — ABNORMAL HIGH (ref 65–99)

## 2016-06-21 LAB — RENAL FUNCTION PANEL
Albumin: 1.3 g/dL — ABNORMAL LOW (ref 3.5–5.0)
Anion gap: 17 — ABNORMAL HIGH (ref 5–15)
BUN: 64 mg/dL — AB (ref 6–20)
CHLORIDE: 98 mmol/L — AB (ref 101–111)
CO2: 21 mmol/L — AB (ref 22–32)
CREATININE: 3.69 mg/dL — AB (ref 0.44–1.00)
Calcium: 9.1 mg/dL (ref 8.9–10.3)
GFR calc Af Amer: 17 mL/min — ABNORMAL LOW (ref >60)
GFR calc non Af Amer: 14 mL/min — ABNORMAL LOW (ref >60)
Glucose, Bld: 162 mg/dL — ABNORMAL HIGH (ref 65–99)
POTASSIUM: 3.9 mmol/L (ref 3.5–5.1)
Phosphorus: 5.3 mg/dL — ABNORMAL HIGH (ref 2.5–4.6)
Sodium: 136 mmol/L (ref 135–145)

## 2016-06-21 LAB — CBC
HCT: 24 % — ABNORMAL LOW (ref 36.0–46.0)
HEMOGLOBIN: 7.1 g/dL — AB (ref 12.0–15.0)
MCH: 26.2 pg (ref 26.0–34.0)
MCHC: 29.6 g/dL — ABNORMAL LOW (ref 30.0–36.0)
MCV: 88.6 fL (ref 78.0–100.0)
PLATELETS: 422 10*3/uL — AB (ref 150–400)
RBC: 2.71 MIL/uL — AB (ref 3.87–5.11)
RDW: 19.4 % — ABNORMAL HIGH (ref 11.5–15.5)
WBC: 16.3 10*3/uL — ABNORMAL HIGH (ref 4.0–10.5)

## 2016-06-21 MED ORDER — DARBEPOETIN ALFA 200 MCG/0.4ML IJ SOSY: 200.0000 ug | PREFILLED_SYRINGE | INTRAMUSCULAR | Status: DC

## 2016-06-21 NOTE — Procedures (Signed)
Patient seen and examined on Hemodialysis. QB 300, UF goal 0.5 L  Appears comfortable.  Opens eyes to voice.  Treatment adjusted as needed.  Bufford ButtnerElizabeth Saramarie Stinger MD  [facility] 2:35 PM

## 2016-06-21 NOTE — Progress Notes (Signed)
Referring Physician(s): Dr Leone Haven  Supervising Physician: Malachy Moan  Patient Status:  [name] - In-pt  Chief Complaint:  Dysphagia; CVA Perc gastric tube placed 5/10  Subjective:  Resting in bed Eyes are open No response to me Seems comfortable  Allergies: Azithromycin  Medications: Prior to Admission medications   Medication Sig Start Date End Date Taking? Authorizing Provider  Amino Acids-Protein Hydrolys (FEEDING SUPPLEMENT, PRO-STAT SUGAR FREE 64,) LIQD Take 30 mLs by mouth 3 (three) times daily. 05/31/16  Yes Meredeth Ide, MD  atorvastatin (LIPITOR) 20 MG tablet Take 20 mg by mouth every evening.    Yes [provider]  carvedilol (COREG) 25 MG tablet Take 1 tablet (25 mg total) by mouth 2 (two) times daily with a meal. 01/28/16  Yes Hedges, Tinnie Gens, PA-C  DULoxetine (CYMBALTA) 30 MG capsule Take 1 capsule (30 mg total) by mouth daily. 05/31/16  Yes Sharl Ma, Sarina Ill, MD  insulin aspart (NOVOLOG) 100 UNIT/ML injection Inject 0-20 Units into the skin 3 (three) times daily with meals. CBG < 70: implement hypoglycemia protocol CBG 70 - 120: 0 units CBG 121 - 150: 3 units CBG 151 - 200: 4 units CBG 201 - 250: 7 units CBG 251 - 300: 11 units CBG 301 - 350: 15 units CBG 351 - 400: 20 units 01/28/16  Yes Hedges, Tinnie Gens, PA-C  insulin glargine (LANTUS) 100 UNIT/ML injection Inject 0.14 mLs (14 Units total) into the skin at bedtime. 05/31/16  Yes Meredeth Ide, MD  multivitamin (RENA-VIT) TABS tablet Take 1 tablet by mouth at bedtime. 05/17/16  Yes Short, Thea Silversmith, MD  nystatin (MYCOSTATIN) 100000 UNIT/ML suspension Take 5 mLs by mouth 4 (four) times daily. For 7 days 06/01/16  Yes [provider]  Probiotic Product (PROBIOTIC PO) Take 1 capsule by mouth daily.   Yes [provider]  senna-docusate (SENOKOT-S) 8.6-50 MG tablet Take 1 tablet by mouth 2 (two) times daily. 05/17/16  Yes Short, Thea Silversmith, MD  sevelamer carbonate (RENVELA) 800 MG tablet  Take 1 tablet (800 mg total) by mouth 3 (three) times daily with meals. 05/31/16  Yes Meredeth Ide, MD  albuterol (PROVENTIL) (2.5 MG/3ML) 0.083% nebulizer solution Take 3 mLs (2.5 mg total) by nebulization every 6 (six) hours as needed for wheezing or shortness of breath. 08/12/15   Charm Rings, MD  camphor-menthol [facility]) lotion Apply 1 application topically every 8 (eight) hours as needed for itching. 03/30/16   Maxie Barb, MD     Vital Signs: BP 137/80   Pulse (!) 107   Temp 98 F (36.7 C) (Oral)   Resp (!) 26   Ht 5\' 2"  (1.575 m)   Wt 226 lb 6.6 oz (102.7 kg)   LMP  (LMP Unknown) Comment: AMS due to CVA  SpO2 99%   BMI 41.41 kg/m   Physical Exam  Abdominal: Soft. Bowel sounds are normal.  Skin: Skin is warm and dry.  Skin site is clean and dry NT No bleeding  Afeb  +BS    Imaging: Ct Head Wo Contrast  Result Date: 06/18/2016 CLINICAL DATA:  Altered mental status. The patient began spontaneously nonverbal during dialysis 05/21/2016. Acute infarcts on brain MRI 05/23/2016. EXAM: CT HEAD WITHOUT CONTRAST TECHNIQUE: Contiguous axial images were obtained from the base of the skull through the vertex without intravenous contrast. COMPARISON:  Brain MRI and head CT scan 05/17/2016. FINDINGS: Brain: The brain is atrophic with extensive chronic microvascular ischemic change and multiple remote infarcts bilaterally.  Large infarct is again seen in the right cerebellum. No evidence of acute infarct, hemorrhage, mass lesion, mass effect, midline shift or abnormal extra-axial fluid collection. No hydrocephalus or pneumocephalus. Vascular: Atherosclerosis noted. Skull: Intact. Sinuses/Orbits: Negative. Other: None. IMPRESSION: No acute abnormality. Age advanced atrophy and multiple bilateral infarcts as seen on prior exams. Atherosclerosis. Electronically Signed   By: Drusilla Kannerhomas  Dalessio M.D.   On: 06/18/2016 10:24   Ir Gastrostomy Tube Mod Sed  Result Date: 06/20/2016 CLINICAL  DATA:  Previous stroke, dysphagia, needs enteral feeding support EXAM: PERC PLACEMENT GASTROSTOMY FLUOROSCOPY TIME:  2.5 minutes, 190 uGym2 DAP TECHNIQUE: The procedure, risks, benefits, and alternatives were explained to the patient and family. Questions regarding the procedure were encouraged and answered. The family understands and consents to the procedure. The patient was already receiving adequate prophylactic IV antibiotic coverage. Safe percutaneous approach was confirmed on a recent CT scan. A 5 French angiographic catheter was placed as orogastric tube. The upper abdomen was prepped with Betadine, draped in usual sterile fashion, and infiltrated locally with 1% lidocaine. Intravenous Fentanyl and Versed were administered as conscious sedation during continuous monitoring of the patient's level of consciousness and physiological / cardiorespiratory status by the radiology RN, with a total moderate sedation time of 22 minutes. Stomach was insufflated using air through the orogastric tube. An 3718 French sheath needle was advanced percutaneously into the gastric lumen under fluoroscopy. Gas could be aspirated and a small contrast injection confirmed intraluminal spread. The sheath was exchanged over a guidewire for a 9 [place]French vascular sheath, through which the snare device was advanced and used to snare a guidewire passed through the orogastric tube. This was withdrawn, and the snare attached to the 20 French pull-through gastrostomy tube, which was advanced antegrade, positioned with the internal bumper securing the anterior gastric wall to the anterior abdominal wall. Small contrast injection confirms appropriate positioning. The external bumper was applied and the catheter was flushed. COMPLICATIONS: COMPLICATIONS none IMPRESSION: 1. Technically successful 20 French pull-through gastrostomy placement under fluoroscopy. Electronically Signed   By: Corlis Leak  Hassell M.D.   On: 06/20/2016 16:44   Ir Removal Tun Cv  Cath W/o Fl  Result Date:  INDICATION: History of end-stage renal disease, now with functional dialysis access. Dialysis catheter was initially placed by Dr. Lowella DandyHenn on 05/07/2016 and has functioned well throughout duration of usage. EXAM: REMOVAL OF TUNNELED HEMODIALYSIS CATHETER MEDICATIONS: None COMPLICATIONS: None immediate. PROCEDURE: Informed written consent was obtained from the patient's family following an explanation of the procedure, risks, benefits and alternatives to treatment. A time out was performed prior to the initiation of the procedure. Maximal barrier sterile technique was utilized including caps, mask, sterile gowns, sterile gloves, large sterile drape, hand hygiene, and Betadine. 1% lidocaine with epinephrine was injected under sterile conditions along the subcutaneous tunnel. Utilizing a combination of blunt dissection and gentle traction, the catheter was removed intact. Hemostasis was obtained with manual compression. A dressing was placed. The patient tolerated the procedure well without immediate post procedural complication. IMPRESSION: Successful removal of tunneled dialysis catheter. Electronically Signed   By: Simonne ComeJohn  Watts M.D.   On: 07/07/2016 17:07    Labs:  CBC:  Recent Labs  06/16/16 0226 06/20/2016 0151 06/19/16 0845 06/20/16 0314  WBC 16.6* 15.2* 13.1* 14.8*  HGB 9.5* 8.9* 7.7* 8.8*  HCT 30.9* 29.0* 26.1* 29.5*  PLT 244 257 368 343    COAGS:  Recent Labs  05/07/16 0737 05/08/16 0720 06/20/16 0314  INR 1.70 1.90 1.34  APTT 44*  --  39*    BMP:  Recent Labs  06/14/16 1001 06/16/16 0226 06/16/2016 0151 06/19/16 0845  NA 134* 132* 132* 135  K 2.5* 3.1* 4.1 3.3*  CL 94* 91* 95* 97*  CO2 28 26 25 27   GLUCOSE 172* 275* 303* 157*  BUN 21* 62* 91* 54*  CALCIUM 8.0* 9.0 9.1 8.7*  CREATININE 1.36* 2.87* 3.56* 2.44*  GFRNONAA 48* 20* 15* 24*  GFRAA 56* 23* 17* 28*    LIVER FUNCTION TESTS:  Recent Labs  06/05/2016 1745 06/09/16 0258   07/08/2016 0230 06/12/16 0236 06/14/16 0800 06/19/16 0845  BILITOT 1.0 0.8  --  1.9* 1.2  --   --   AST 28 17  --  18 48*  --   --   ALT 30 25  --  22 36  --   --   ALKPHOS 113 101  --  116 167*  --   --   PROT 8.5* 8.0  --  7.9 8.3*  --   --   ALBUMIN 2.1* 1.8*  < > 1.7* 1.8* 1.4* 1.4*  < > = values in this interval not displayed.  Assessment and Plan:  Percutaneous gastric tube placed 5/10 May use now  Electronically Signed: Jill Stopka A 06/21/2016, 7:01 AM   I spent a total of 15 Minutes at the the patient's bedside AND on the patient's [name] floor or unit, greater than 50% of which was counseling/coordinating care for G tube ppacement

## 2016-06-21 NOTE — [place] FL2 (Signed)
[place] MEDICAID FL2 LEVEL OF CARE SCREENING TOOL     IDENTIFICATION  Patient Name: [name] Birthdate: 01-28-1976 Sex: female Admission Date (Current Location): 06/04/2016  [facility] and [place] Number:  Producer, television/film/video and Address:  [facility], [address], [place], [place] [address]      Provider Number: [id number]  Attending Physician Name and Address:  Lonia Blood, MD  Relative Name and Phone Number:       Current Level of Care: Hospital Recommended Level of Care: Skilled Nursing Facility Prior Approval Number:    Date Approved/Denied:   PASRR Number: [id number]  Discharge Plan: SNF    Current Diagnoses: Patient Active Problem List   Diagnosis Date Noted  . Wound abscess   . CVA (cerebral vascular accident) (HCC) 06/08/2016  . 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 Pressure injury of skin 05/27/2016  . ESRD (end stage renal disease) (HCC)   . Goals of care, counseling/discussion   . Palliative care by specialist   . Acute metabolic encephalopathy 05/04/2016  . Acute renal failure superimposed on stage 5 chronic kidney disease, not on chronic dialysis (HCC) 05/04/2016  . Acute respiratory failure with hypoxia (HCC) 05/04/2016  . Right middle lobe pneumonia (HCC) 05/04/2016  . Leukocytosis 05/04/2016  . Uncontrolled diabetes mellitus with diabetic nephropathy, with long-term current use of insulin (HCC) 05/04/2016  . Dyslipidemia associated with type 2 diabetes mellitus (HCC) 05/04/2016  . Acute pulmonary edema (HCC)   . History of CVA with residual deficit   . Anemia of chronic disease   . Lymphedema   . Debility   . Essential hypertension   . Morbid obesity (HCC) 02/05/2015    Orientation RESPIRATION BLADDER Height & Weight        Normal Continent (Anuric) Weight:  (bed scale not working) Height:  5\' 2"  (157.5 cm)  BEHAVIORAL SYMPTOMS/MOOD NEUROLOGICAL BOWEL NUTRITION STATUS   (None)   (CVA) Incontinent (Gastrostomy. Rectal tube with balloon.) Feeding tube (PEG)  AMBULATORY STATUS COMMUNICATION OF NEEDS Skin   Total Care Non-Verbally (Moans) Skin abrasions, Other (Comment), Surgical wounds, Wound Vac, PU Stage and Appropriate Care (MASD, Skin tear. Deep tissue injury: Right heel (Prevalon boots). Unstageable pressure injury: Left heel (Prevalon boots). Open/dehisced wound/incision: Right posterior lower leg, Right left mid buttocks, Mid Sacrum, Left thigh (Moisture barrier))   PU Stage 2 Dressing:  (Right groin: Moisture barrier)                   Personal Care Assistance Level of Assistance  Total care       Total Care Assistance: Maximum assistance   Functional Limitations Info  Sight, Hearing, Speech (Unable to assess: Minimally responsive, moans.)          SPECIAL CARE FACTORS FREQUENCY  PT (By licensed PT), Blood pressure, OT (By licensed OT)     PT Frequency: 5 x week OT Frequency: 5 x week            Contractures Contractures Info: Not present    Additional Factors Info  Code Status, Allergies, Psychotropic Code Status Info: Full Allergies Info: Azitromycin Psychotropic Info: Depression due to physical illness: Prozac 25 mg/5 mL soluation per tube daily.         Current Medications (06/21/2016):  This is the current hospital active medication list Current Facility-Administered Medications  Medication Dose Route Frequency Provider Last Rate Last Dose  . acetaminophen (TYLENOL) suppository 650 mg  650 mg  Rectal Q4H PRN Michael Litterarter, Nikki, MD   650 mg at 06/10/16 1143  . acetaminophen (TYLENOL) tablet 650 mg  650 mg Per Tube Q4H PRN Lonia BloodMcClung, Jeffrey T, MD   650 mg at 06/19/16 0132  . aspirin tablet 325 mg  325 mg Per Tube Daily Lonia BloodMcClung, Jeffrey T, MD   325 mg at 06/21/16 [id number]  . atorvastatin (LIPITOR) tablet 20 mg  20 mg Per Tube QPM Lonia BloodMcClung, Jeffrey T, MD   20 mg at 06/19/16 1845  . chlorhexidine (PERIDEX) 0.12 % solution 15 mL  15 mL Mouth Rinse  BID Amin, Ankit Chirag, MD   15 mL at 06/21/16 0927  . [START ON 06/26/2016] Darbepoetin Alfa (ARANESP) injection 200 mcg  200 mcg Intravenous Q Wed-HD Bufford ButtnerUpton, Elizabeth, MD      . feeding supplement (NEPRO CARB STEADY) liquid 1,000 mL  1,000 mL Oral Q24H Lonia BloodMcClung, Jeffrey T, MD 20 mL/hr at 06/21/16 1227 1,000 mL at 06/21/16 1227  . feeding supplement (PRO-STAT SUGAR FREE 64) liquid 30 mL  30 mL Per Tube TID Lonia BloodMcClung, Jeffrey T, MD   30 mL at 06/21/16 0929  . FLUoxetine (PROZAC) 20 MG/5ML solution 20 mg  20 mg Per Tube Daily Anderson MaltaGolding, Elizabeth L, DO   20 mg at 06/21/16 [id number]  . glucagon (human recombinant) (GLUCAGEN) injection    PRN Oley BalmHassell, Daniel, MD   0.25 mg at 06/20/16 1556  . hydrALAZINE (APRESOLINE) injection 10 mg  10 mg Intravenous Q4H PRN Amin, Ankit Chirag, MD      . HYDROcodone-acetaminophen (NORCO/VICODIN) 5-325 MG per tablet 1-2 tablet  1-2 tablet Oral Q4H PRN Oley BalmHassell, Daniel, MD      . HYDROmorphone (DILAUDID) injection 1 mg  1 mg Intravenous Q2H PRN Oley BalmHassell, Daniel, MD      . insulin aspart (novoLOG) injection 0-9 Units  0-9 Units Subcutaneous Q4H Michael Litterarter, Nikki, MD   1 Units at 06/21/16 1250  . insulin glargine (LANTUS) injection 30 Units  30 Units Subcutaneous QHS Lonia BloodMcClung, Jeffrey T, MD   30 Units at 06/19/16 2200  . metoprolol (LOPRESSOR) injection 5 mg  5 mg Intravenous Q4H PRN Amin, Ankit Chirag, MD      . ondansetron (ZOFRAN) injection 4 mg  4 mg Intravenous Q4H PRN Oley BalmHassell, Daniel, MD      . piperacillin-tazobactam (ZOSYN) IVPB 3.375 g  3.375 g Intravenous Q12H Ginnie SmartHatcher, Jeffrey C, MD 12.5 mL/hr at 06/21/16 0937 3.375 g at 06/21/16 0937  . sodium chloride 0.9 % bolus 250 mL  250 mL Intravenous Once Amin, Ankit Chirag, MD      . vancomycin (VANCOCIN) IVPB 1000 mg/200 mL premix  1,000 mg Intravenous Q M,W,F-HD Lonia BloodMcClung, Jeffrey T, MD      . Zinc Oxide (TRIPLE PASTE) 12.8 % ointment   Topical BID Amin, Loura HaltAnkit Chirag, MD         Discharge Medications: Please see discharge summary for  a list of discharge medications.  Relevant Imaging Results:  Relevant Lab Results:   Additional Information SS#: [id number]. [facility] Wound vac changed MWF.  Margarito LinerSarah C Iliyana Convey, LCSW

## 2016-06-21 NOTE — Progress Notes (Signed)
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  She was discharged to a SNF.  She had been dialyzing on MWF.  Upon completion of her HD session on the day of presentation she was noted to have mental status changes.  It was not clear to the personnel when she was last known normal.  She was transferred to the ED where she had a WBC count of 20, Hgb 9, Ammonia level normal.  Head CT was negative for acute process.  MRI/MRA noted acute right deep gray nuclei and right hippocampal infarcts with an occluded R MCA, Right ICA, and Left vertebral artery.  Subjective: When I enter the room the patient has her eyes open.  She does not turn to look at the examiner but she is moving her eyes.  She does not respond to my questions nor does she follow simple commands.  Assessment & Plan:  Acute metabolic encephalopathy in setting of chronic encephalopathy During last admit mental status apparently waxed and waned - Psych saw in consultation and suggested tx for depression at that time - her MS is not improving, making it less likely to be connected to her sepsis - I too am concerned as is Palliative Care that this may be a result of her confirmed acute CVAs at presentation - avoiding sedating meds - repeat head imaging w/ CT 5/8 noted no new findings - ever so slightly more alert today, but not to a signif extent   L posterior/medial thigh wound s/p debridement by Len Childs 05/21/16 - completed a course of abx tx on 05/31/16 - CT of the thigh this admit noted a new area c/w an abscess not communicating with the currently opened area - Gen  Surg took her to the OR 06/26/2016 - broad abx coverage continues as suggested by ID - wound care per Gen Surgery   ?SBE - Possible 7x11mm vegetation L coronary cusp (per TTE) - ruled out via TEE Blood cx during last admit 1 of 6 + for coag neg Staph strongly suggestive of contaminant - no such mass noted on TTE 03/22/16 - TEE 06/28/2016 w/o evidence of SBE/vegetation - abx per ID    Acute multifocal CVAs with history of multiple prior CVAs bedbound at baseline due to prior CVAs - Neurology has evaluated and since signed off - they felt her AMS was out of proportion to her CVAs - I suspect her CVAs may in fact be the cause of her current AMS  ESRD on HD Nephrology attending to HD M/W/F   Reactive RPR - false + Quant f/u RPR 1:1 w/ negative T pallidum abs - ID feels this is a false +  Morbid obesity - Body mass index is 41.41 kg/m.   Nutrition  s/p PEG placement in IR 5/10 - tube feeds to resume  HTN wish to avoid hypotension during HD due to persisting cerebrovascular disease   DM CBG improved - follow w/o change in tx today   Chronic diastolic congestive heart failure TTE 4/28 noted EF 55-60% w/ grade 1 DD - EDW per HD center is 105.5kg - no signif volume overload at this time -  volume management per hemodialysis   DVT prophylaxis: [place] heparin  Code Status: FULL CODE Family Communication: no family present at time of exam  Disposition Plan: SDU  Consultants:  Neurology  Gen Surgery Nephrology   Procedures: 5/10 PEG tube placed in IR  Antimicrobials:  Zosyn 4/30 > 5/02 + 5/07 > Cefepime 5/02 > 5/06 Vanc 4/30 >   Objective: Blood pressure (!) 151/80, pulse (!) 109, temperature 98.8 F (37.1 C), temperature source Axillary, resp. rate (!) 25, height 5\' 2"  (1.575 m), weight 102.7 kg (226 lb 6.6 oz), SpO2 99 %.  Intake/Output Summary (Last 24 hours) at 06/21/16 1355 Last data filed at 06/21/16 [id number]  Gross per 24 hour  Intake              130 ml  Output                0 ml    Net              130 ml   Filed Weights    Examination: General: No acute respiratory distress - eyes open - does not follow  Lungs: Clear to auscultation B w/ distant BS  Cardiovascular: RRR w/o M or rub  Abdomen: Obese, soft, bowel sounds present, no organomegaly, no rebound  Extremities: L medial thigh wound w/ VAC dressing in place  Neurologic: Obtunded, no Babinski - does not withdraw from painful stimuli - eyes open    CBC:  Recent Labs Lab 06/16/16 0226 07/08/2016 0151 06/19/16 0845 06/20/16 0314  WBC 16.6* 15.2* 13.1* 14.8*  HGB 9.5* 8.9* 7.7* 8.8*  HCT 30.9* 29.0* 26.1* 29.5*  MCV 86.6 87.3 89.1 89.9  PLT 244 257 368 343   Basic Metabolic Panel:  Recent Labs Lab 06/16/16 0226 06/24/2016 0151 06/19/16 0845  NA 132* 132* 135  K 3.1* 4.1 3.3*  CL 91* 95* 97*  CO2 26 25 27   GLUCOSE 275* 303* 157*  BUN 62* 91* 54*  CREATININE 2.87* 3.56* 2.44*  CALCIUM 9.0 9.1 8.7*  MG 2.2 2.3  --   PHOS  --   --  2.5   GFR: Estimated Creatinine Clearance: 34.7 mL/min (A) (by C-G formula based on SCr of 2.44 mg/dL (H)).  Liver Function Tests:  Recent Labs Lab 06/19/16 0845  ALBUMIN 1.4*    HbA1C: Hgb A1c MFr Bld  Date/Time Value Ref Range Status  06/09/2016 02:58 AM 6.7 (H) 4.8 - 5.6 % Final    Comment:    (NOTE)         Pre-diabetes: 5.7 - 6.4         Diabetes: >6.4         Glycemic control for adults with diabetes: <7.0   06/08/2016 04:48 AM 6.6 (H) 4.8 - 5.6 % Final    Comment:    (NOTE)         Pre-diabetes: 5.7 - 6.4         Diabetes: >6.4         Glycemic control for adults with diabetes: <7.0     CBG:  Recent Labs Lab 06/20/16 1932 06/20/16 2342 06/21/16 0414 06/21/16 0834 06/21/16 1215  GLUCAP 148* 125* 135* 134* 139*    Scheduled Meds: . aspirin  325 mg Per Tube Daily  . atorvastatin  20 mg Per Tube QPM  . chlorhexidine  15 mL Mouth Rinse BID  . [START ON 06/26/2016] darbepoetin (ARANESP) injection - DIALYSIS  200 mcg Intravenous Q  Wed-HD  . feeding  supplement (NEPRO CARB STEADY)  1,000 mL Oral Q24H  . feeding supplement (PRO-STAT SUGAR FREE 64)  30 mL Per Tube TID  . FLUoxetine  20 mg Per Tube Daily  . insulin aspart  0-9 Units Subcutaneous Q4H  . insulin glargine  30 Units Subcutaneous QHS  . Zinc Oxide   Topical BID     LOS: 13 days    Lonia Blood, MD [facility] Office  [phone] Pager - Text Page per Amion as per below:  On-Call/Text Page:      [email or web address]      password [email or web address]  If 7PM-7AM, please contact night-coverage [email or web address] Password [email or web address] 06/21/2016, 1:55 PM

## 2016-06-21 NOTE — Progress Notes (Signed)
[facility] Progress Note   Subjective: eyes open today.  Doesn't follow commands.  Got PEG yesterday.  Objective Vitals:   06/20/16 1933 06/20/16 2343 06/21/16 0417 06/21/16 0712  BP: 112/65 130/72 137/80 (!) 146/86  Pulse: 92 98 (!) 107 (!) 110  Resp: 19 (!) 22 (!) 26 (!) 26  Temp: 97.6 F (36.4 C) 98.3 F (36.8 C) 98 F (36.7 C) 97.7 F (36.5 C)  TempSrc: Oral Oral Oral Oral  SpO2: 100% 99% 99% 99%  Height:       Physical Exam General: eyes open HEENT: Disconjugate gaze Heart: RRR Lungs: CTAB A/P Abdomen: Active BS. Flexaseal with brown stool.  Extremities: Wound vac L thigh. Heel protectors BLE. No edema.  Diffuse sarcopenia  Picture of wound reviewed in epic- appears to have had some necrotic tissue which has debrided.  R thigh with some induration and hyperpigmented tissue, new erosion and area of skin breakdown noted intertriginous area in groin Dialysis Access: RIJ TDC. LUA AVG + bruit   Additional Objective Labs: Basic Metabolic Panel:  Recent Labs Lab 06/16/16 0226 06/21/2016 0151 06/19/16 0845  NA 132* 132* 135  K 3.1* 4.1 3.3*  CL 91* 95* 97*  CO2 26 25 27   GLUCOSE 275* 303* 157*  BUN 62* 91* 54*  CREATININE 2.87* 3.56* 2.44*  CALCIUM 9.0 9.1 8.7*  PHOS  --   --  2.5   Liver Function Tests:  Recent Labs Lab 06/19/16 0845  ALBUMIN 1.4*   No results for input(s): LIPASE, AMYLASE in the last 168 hours. CBC:  Recent Labs Lab 06/16/16 0226 06/28/2016 0151 06/19/16 0845 06/20/16 0314  WBC 16.6* 15.2* 13.1* 14.8*  HGB 9.5* 8.9* 7.7* 8.8*  HCT 30.9* 29.0* 26.1* 29.5*  MCV 86.6 87.3 89.1 89.9  PLT 244 257 368 343   Blood Culture    Component Value Date/Time   SDES WOUND 06/21/2016 1304   SPECREQUEST LEFT THIGH ABSCESS 06/22/2016 1304   CULT (A) 06/29/2016 1304    MULTIPLE ORGANISMS PRESENT, NONE PREDOMINANT NO ANAEROBES ISOLATED    REPTSTATUS 06/14/2016 FINAL 06/20/2016 1304    Cardiac Enzymes: No results for  input(s): CKTOTAL, CKMB, CKMBINDEX, TROPONINI in the last 168 hours. CBG:  Recent Labs Lab 06/20/16 1639 06/20/16 1932 06/20/16 2342 06/21/16 0414 06/21/16 0834  GLUCAP 164* 148* 125* 135* 134*   Studies/Results: Ir Gastrostomy Tube Mod Sed  Result Date: 06/20/2016 CLINICAL DATA:  Previous stroke, dysphagia, needs enteral feeding support EXAM: PERC PLACEMENT GASTROSTOMY FLUOROSCOPY TIME:  2.5 minutes, 190 uGym2 DAP TECHNIQUE: The procedure, risks, benefits, and alternatives were explained to the patient and family. Questions regarding the procedure were encouraged and answered. The family understands and consents to the procedure. The patient was already receiving adequate prophylactic IV antibiotic coverage. Safe percutaneous approach was confirmed on a recent CT scan. A 5 French angiographic catheter was placed as orogastric tube. The upper abdomen was prepped with Betadine, draped in usual sterile fashion, and infiltrated locally with 1% lidocaine. Intravenous Fentanyl and Versed were administered as conscious sedation during continuous monitoring of the patient's level of consciousness and physiological / cardiorespiratory status by the radiology RN, with a total moderate sedation time of 22 minutes. Stomach was insufflated using air through the orogastric tube. An 29 French sheath needle was advanced percutaneously into the gastric lumen under fluoroscopy. Gas could be aspirated and a small contrast injection confirmed intraluminal spread. The sheath was exchanged over a guidewire for a 9 [place] vascular sheath, through which the  snare device was advanced and used to snare a guidewire passed through the orogastric tube. This was withdrawn, and the snare attached to the 20 French pull-through gastrostomy tube, which was advanced antegrade, positioned with the internal bumper securing the anterior gastric wall to the anterior abdominal wall. Small contrast injection confirms appropriate  positioning. The external bumper was applied and the catheter was flushed. COMPLICATIONS: COMPLICATIONS none IMPRESSION: 1. Technically successful 20 French pull-through gastrostomy placement under fluoroscopy. Electronically Signed   By: Corlis Leak  Hassell M.D.   On: 06/20/2016 16:44   Medications: . piperacillin-tazobactam (ZOSYN)  IV 3.375 g (06/21/16 0937)  . sodium chloride    . vancomycin     . aspirin  325 mg Per Tube Daily  . atorvastatin  20 mg Per Tube QPM  . chlorhexidine  15 mL Mouth Rinse BID  . darbepoetin (ARANESP) injection - DIALYSIS  150 mcg Intravenous Q Wed-HD  . feeding supplement (NEPRO CARB STEADY)  1,000 mL Oral Q24H  . feeding supplement (PRO-STAT SUGAR FREE 64)  30 mL Per Tube TID  . FLUoxetine  20 mg Per Tube Daily  . insulin aspart  0-9 Units Subcutaneous Q4H  . insulin glargine  30 Units Subcutaneous QHS  . Zinc Oxide   Topical BID     Dialysis: [facility]  4:30 hours, 400/800 105.5kg 3K/2Ca TDC R IJ/ maturing LUA AVG Hep 4400 - Mircera 150mcg IV q 2 weeks (last given 4/25) - Calcitriol 0.7225mcg PO q HD   Assessment/Plan: 1. Acute multifocal CVAs:Plan per primary.  No vegetations on TEE.  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  ? If recurrent multifocal infarcts are due to hypotensive ischemic events.  Her repeat CT shows age-advanced atrophy and no new infarcts. 2. AMS: poorly responsive, CVA +/- infection.  Would consider neuro eval 3. L thigh soft-tissue abscess/ infection:sp I&D 4/10 and repeat I&D 5/2 by gen surg. VAC in place, IV abx vanc/ Zosyn.  ID following. There are new areas of skin breakdown- have spoken with nursing staff, wound c/s to come by again. 4. Abnormal ECHO: Possible vegetation on L coronary cusp per TTE--> TEE without vegetation 1/6 +bcx's for CNSS was felt to be contaminant most likely per ID.  5. ESRD: MWF HD. Using L arm AVG, HD cath removed 5/7.  4K bath.  Will keep SBP > 105 6. HTN: home  coreg on hold, getting q 8hr IV metoprolol.  catapres patch  7. Volume: No recent wt. No LE edema. Running even 8. Anemia: Hgb 9.5rec'd 2u prbc's 06/14/16. Weekly ESA, Aranesp 150 q wednesday. Not candidate for IV Fe with infection/ ^^ferritin.Last tsat 4/19 not calc due to low transferrin. Ferritin 450 >1000>3100 over last 3 mos. Increased to 200 to begin next Wed 9. Metabolic bone disease: Ca Holding VDRA/ binders for now 10. Nutrition: Alb very low, getting TF 11. Disposition: Full care.  Bufford ButtnerElizabeth Aspasia Rude MD [facility] pgr [phone] 06/21/2016, 11:41 AM

## 2016-06-22 LAB — GLUCOSE, CAPILLARY
GLUCOSE-CAPILLARY: 281 mg/dL — AB (ref 65–99)
GLUCOSE-CAPILLARY: 285 mg/dL — AB (ref 65–99)
GLUCOSE-CAPILLARY: 341 mg/dL — AB (ref 65–99)
GLUCOSE-CAPILLARY: 388 mg/dL — AB (ref 65–99)
GLUCOSE-CAPILLARY: 374 mg/dL — AB (ref 65–99)
Glucose-Capillary: 235 mg/dL — ABNORMAL HIGH (ref 65–99)
Glucose-Capillary: 384 mg/dL — ABNORMAL HIGH (ref 65–99)

## 2016-06-22 MED ORDER — INSULIN ASPART 100 UNIT/ML ~~LOC~~ SOLN
0.0000 [IU] | Freq: Every day | SUBCUTANEOUS | Status: DC
  Administered 2016-06-22: 5 [IU] via SUBCUTANEOUS

## 2016-06-22 MED ORDER — INSULIN ASPART 100 UNIT/ML ~~LOC~~ SOLN
0.0000 [IU] | Freq: Three times a day (TID) | SUBCUTANEOUS | Status: DC
  Administered 2016-06-22: 8 [IU] via SUBCUTANEOUS

## 2016-06-22 MED ORDER — DARBEPOETIN ALFA 300 MCG/0.6ML IJ SOSY: 300.0000 ug | PREFILLED_SYRINGE | INTRAMUSCULAR | Status: DC

## 2016-06-22 MED ORDER — HEPARIN SODIUM (PORCINE) 5000 UNIT/ML IJ SOLN
5000.0000 [IU] | Freq: Three times a day (TID) | INTRAMUSCULAR | Status: DC
  Administered 2016-06-22 – 2016-07-15 (×63): 5000 [IU] via SUBCUTANEOUS
  Filled 2016-06-22 (×67): qty 1

## 2016-06-22 MED ORDER — INSULIN GLARGINE 100 UNIT/ML ~~LOC~~ SOLN
20.0000 [IU] | Freq: Two times a day (BID) | SUBCUTANEOUS | Status: DC
  Filled 2016-06-22: qty 0.2

## 2016-06-22 NOTE — Progress Notes (Signed)
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  She was discharged to a SNF.  She had been dialyzing on MWF.  Upon completion of her HD session on the day of presentation she was noted to have mental status changes.  It was not clear to the personnel when she was last known normal.  She was transferred to the ED where she had a WBC count of 20, Hgb 9, Ammonia level normal.  Head CT was negative for acute process.  MRI/MRA noted acute right deep gray nuclei and right hippocampal infarcts with an occluded R MCA, Right ICA, and Left vertebral artery.  Subjective: The patient will not open her eyes for me today.  She is entirely unresponsive.  She does not appear uncomfortable.  There is no evidence of respiratory distress.  Assessment & Plan:  Acute metabolic encephalopathy in setting of chronic encephalopathy During last admit mental status apparently waxed and waned - Psych saw in consultation and suggested tx for depression at that time - her MS is not improving, making it less likely to be connected to her sepsis - I too am concerned as is Palliative Care that this may be a result of her confirmed acute CVAs at presentation - avoiding sedating meds - repeat head imaging w/ CT 5/8 noted no new findings - this appears to be the patient's new baseline   Acute multifocal CVAs with history of multiple prior CVAs Reportedly bedbound at baseline due to prior CVAs - Neurology has evaluated and since signed off - they felt her AMS was out of proportion to her CVAs - I suspect her CVAs may in fact be the cause of her AMS  L  posterior/medial thigh wound s/p debridement by Len Childs 05/21/16 - completed a course of abx tx on 05/31/16 - CT of the thigh this admit noted a new area c/w an abscess not communicating with the currently opened area - Gen Surg took her to the OR  - broad abx coverage continues as suggested by ID - wound care per Gen Surgery   ?SBE - Possible 7x27mm vegetation L coronary cusp (per TTE) - ruled out via TEE Blood cx during last admit 1 of 6 + for coag neg Staph strongly suggestive of contaminant - no such mass noted on TTE 03/22/16 - TEE 06/14/2016 w/o evidence of SBE/vegetation - abx per ID   ESRD on HD Nephrology attending to HD M/W/F   Reactive RPR > false + Quant f/u RPR 1:1 w/ negative T pallidum abs - ID feels this is a false +  Morbid obesity - Body mass index is 41.41 kg/m.   Nutrition  s/p PEG placement in IR 5/10 - tube feeds infusing w/o difficulty presently   HTN wish to avoid hypotension during HD due to persisting cerebrovascular disease   DM CBG not well controlled at this time - adjust tx and follow   Chronic diastolic congestive heart failure TTE 4/28 noted EF 55-60% w/ grade 1 DD - EDW per HD center is 105.5kg - no signif volume overload at this time - volume management per hemodialysis   DVT prophylaxis: [place] heparin  Code Status: FULL CODE Family Communication: no family present at time of exam  Disposition Plan: SDU  Consultants:  Neurology  Gen Surgery  Nephrology   Procedures: 5/10 PEG tube placed in IR  Antimicrobials:  Zosyn 4/30 > 5/02 + 5/07 > Cefepime 5/02 > 5/06 Vanc 4/30 >   Objective: Blood pressure 125/65, pulse (!) 118, temperature 100 F (37.8 C), temperature source Axillary, resp. rate (!) 24, height 5\' 2"  (1.575 m), weight 102.7 kg (226 lb 6.6 oz), SpO2 98 %.  Intake/Output Summary (Last 24 hours) at 06/22/16 1347 Last data filed at 06/22/16 0900  Gross per 24 hour  Intake          1740.33 ml  Output              350 ml  Net           1390.33 ml   Filed Weights    Examination: General: No acute respiratory distress - does not open eyes or follow commands - no reaction to sternal rub Lungs: Distant breath sounds in all fields with no wheezing Cardiovascular: Tachycardic but regular without rub Abdomen: Obese, soft, bowel sounds present, no organomegaly, no rebound  Extremities: L medial thigh wound w/ VAC dressing in place    CBC:  Recent Labs Lab 06/16/16 0226 06/26/2016 0151 06/19/16 0845 06/20/16 0314 06/21/16 1330  WBC 16.6* 15.2* 13.1* 14.8* 16.3*  HGB 9.5* 8.9* 7.7* 8.8* 7.1*  HCT 30.9* 29.0* 26.1* 29.5* 24.0*  MCV 86.6 87.3 89.1 89.9 88.6  PLT 244 257 368 343 422*   Basic Metabolic Panel:  Recent Labs Lab 06/16/16 0226 06/23/2016 0151 06/19/16 0845 06/21/16 1330  NA 132* 132* 135 136  K 3.1* 4.1 3.3* 3.9  CL 91* 95* 97* 98*  CO2 26 25 27  21*  GLUCOSE 275* 303* 157* 162*  BUN 62* 91* 54* 64*  CREATININE 2.87* 3.56* 2.44* 3.69*  CALCIUM 9.0 9.1 8.7* 9.1  MG 2.2 2.3  --   --   PHOS  --   --  2.5 5.3*   GFR: Estimated Creatinine Clearance: 23 mL/min (A) (by C-G formula based on SCr of 3.69 mg/dL (H)).  Liver Function Tests:  Recent Labs Lab 06/19/16 0845 06/21/16 1330  ALBUMIN 1.4* 1.3*    HbA1C: Hgb A1c MFr Bld  Date/Time Value Ref Range Status  06/09/2016 02:58 AM 6.7 (H) 4.8 - 5.6 % Final    Comment:    (NOTE)         Pre-diabetes: 5.7 - 6.4         Diabetes: >6.4         Glycemic control for adults with diabetes: <7.0   06/08/2016 04:48 AM 6.6 (H) 4.8 - 5.6 % Final    Comment:    (NOTE)         Pre-diabetes: 5.7 - 6.4         Diabetes: >6.4         Glycemic control for adults with diabetes: <7.0     CBG:  Recent Labs Lab 06/21/16 2050 06/22/16 0029 06/22/16 0502 06/22/16 0819 06/22/16 1136  GLUCAP 158* 235* 281* 285* 341*    Scheduled Meds: . aspirin  325 mg Per Tube Daily  . atorvastatin  20 mg Per Tube QPM  . chlorhexidine  15 mL Mouth Rinse BID  .  [START ON 06/26/2016] darbepoetin (ARANESP) injection - DIALYSIS  300 mcg Intravenous Q Wed-HD  . feeding supplement (NEPRO CARB STEADY)  1,000 mL  Oral Q24H  . feeding supplement (PRO-STAT SUGAR FREE 64)  30 mL Per Tube TID  . FLUoxetine  20 mg Per Tube Daily  . insulin aspart  0-9 Units Subcutaneous Q4H  . insulin glargine  30 Units Subcutaneous QHS  . Zinc Oxide   Topical BID     LOS: 14 days    Lonia Blood, MD [facility] Office  [phone] Pager - Text Page per Amion as per below:  On-Call/Text Page:      [email or web address]      password [email or web address]  If 7PM-7AM, please contact night-coverage [email or web address] Password [email or web address] 06/22/2016, 1:47 PM

## 2016-06-22 NOTE — Progress Notes (Signed)
[name] Progress Note   Subjective: sleeping, appears comfortable.    Objective Vitals:   06/21/16 2057 06/22/16 0009 06/22/16 0411 06/22/16 0820  BP: 118/74 127/65 125/84 135/70  Pulse: (!) 116 (!) 119 (!) 120 (!) 118  Resp: (!) 27 (!) 25 (!) 26 (!) 25  Temp: 98.8 F (37.1 C) 98.7 F (37.1 C) 98.6 F (37 C) 99 F (37.2 C)  TempSrc: Oral Oral Oral Axillary  SpO2: 99% 99% 99% 96%  Height:       Physical Exam General: eyes closed Heart: RRR Lungs: CTAB A/P Abdomen: Active BS. Flexaseal with brown stool.  Extremities: Wound vac L thigh. Heel protectors BLE. No edema.  Diffuse sarcopenia  Picture of wound reviewed in epic- appears to have had some necrotic tissue which has debrided.  R thigh with some induration and hyperpigmented tissue, new erosion and area of skin breakdown noted intertriginous area in groin--> this has a skin protector on it now Dialysis Access: RIJ TDC. LUA AVG + bruit   Additional Objective Labs: Basic Metabolic Panel:  Recent Labs Lab 06/24/2016 0151 06/19/16 0845 06/21/16 1330  NA 132* 135 136  K 4.1 3.3* 3.9  CL 95* 97* 98*  CO2 25 27 21*  GLUCOSE 303* 157* 162*  BUN 91* 54* 64*  CREATININE 3.56* 2.44* 3.69*  CALCIUM 9.1 8.7* 9.1  PHOS  --  2.5 5.3*   Liver Function Tests:  Recent Labs Lab 06/19/16 0845 06/21/16 1330  ALBUMIN 1.4* 1.3*   No results for input(s): LIPASE, AMYLASE in the last 168 hours. CBC:  Recent Labs Lab 06/16/16 0226 06/16/2016 0151 06/19/16 0845 06/20/16 0314 06/21/16 1330  WBC 16.6* 15.2* 13.1* 14.8* 16.3*  HGB 9.5* 8.9* 7.7* 8.8* 7.1*  HCT 30.9* 29.0* 26.1* 29.5* 24.0*  MCV 86.6 87.3 89.1 89.9 88.6  PLT 244 257 368 343 422*   Blood Culture    Component Value Date/Time   SDES WOUND 07/03/2016 1304   SPECREQUEST LEFT THIGH ABSCESS 06/20/2016 1304   CULT (A) 06/27/2016 1304    MULTIPLE ORGANISMS PRESENT, NONE PREDOMINANT NO ANAEROBES ISOLATED    REPTSTATUS 06/14/2016 FINAL   1304    Cardiac Enzymes: No results for input(s): CKTOTAL, CKMB, CKMBINDEX, TROPONINI in the last 168 hours. CBG:  Recent Labs Lab 06/21/16 1215 06/21/16 2050 06/22/16 0029 06/22/16 0502 06/22/16 0819  GLUCAP 139* 158* 235* 281* 285*   Studies/Results: Ir Gastrostomy Tube Mod Sed  Result Date: 06/20/2016 CLINICAL DATA:  Previous stroke, dysphagia, needs enteral feeding support EXAM: PERC PLACEMENT GASTROSTOMY FLUOROSCOPY TIME:  2.5 minutes, 190 uGym2 DAP TECHNIQUE: The procedure, risks, benefits, and alternatives were explained to the patient and family. Questions regarding the procedure were encouraged and answered. The family understands and consents to the procedure. The patient was already receiving adequate prophylactic IV antibiotic coverage. Safe percutaneous approach was confirmed on a recent CT scan. A 5 French angiographic catheter was placed as orogastric tube. The upper abdomen was prepped with Betadine, draped in usual sterile fashion, and infiltrated locally with 1% lidocaine. Intravenous Fentanyl and Versed were administered as conscious sedation during continuous monitoring of the patient's level of consciousness and physiological / cardiorespiratory status by the radiology RN, with a total moderate sedation time of 22 minutes. Stomach was insufflated using air through the orogastric tube. An 2218 French sheath needle was advanced percutaneously into the gastric lumen under fluoroscopy. Gas could be aspirated and a small contrast injection confirmed intraluminal spread. The sheath was exchanged over a guidewire  for a 9 [place] vascular sheath, through which the snare device was advanced and used to snare a guidewire passed through the orogastric tube. This was withdrawn, and the snare attached to the 20 French pull-through gastrostomy tube, which was advanced antegrade, positioned with the internal bumper securing the anterior gastric wall to the anterior abdominal wall.  Small contrast injection confirms appropriate positioning. The external bumper was applied and the catheter was flushed. COMPLICATIONS: COMPLICATIONS none IMPRESSION: 1. Technically successful 20 French pull-through gastrostomy placement under fluoroscopy. Electronically Signed   By: Corlis Leak M.D.   On: 06/20/2016 16:44   Medications: . piperacillin-tazobactam (ZOSYN)  IV 3.375 g (06/22/16 0956)  . sodium chloride    . vancomycin 1,000 mg (06/21/16 1711)   . aspirin  325 mg Per Tube Daily  . atorvastatin  20 mg Per Tube QPM  . chlorhexidine  15 mL Mouth Rinse BID  . [START ON 06/26/2016] darbepoetin (ARANESP) injection - DIALYSIS  200 mcg Intravenous Q Wed-HD  . feeding supplement (NEPRO CARB STEADY)  1,000 mL Oral Q24H  . feeding supplement (PRO-STAT SUGAR FREE 64)  30 mL Per Tube TID  . FLUoxetine  20 mg Per Tube Daily  . insulin aspart  0-9 Units Subcutaneous Q4H  . insulin glargine  30 Units Subcutaneous QHS  . Zinc Oxide   Topical BID     D[facility]: [facility]  4:30 hours, 400/800 105.5kg 3K/2Ca TDC R IJ/ maturing LUA AVG Hep 4400 - Mircera IV q 2 weeks (last given 4/25) - Calcitriol 0.69mcg PO q HD   Assessment/Plan: 1. Acute multifocal CVAs:Plan per primary.  No vegetations on TEE.  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  ? If recurrent multifocal infarcts are due to hypotensive ischemic events.  Her repeat CT shows age-advanced atrophy and no new infarcts. 2. AMS: poorly responsive, CVA +/- infection.  Would consider neuro eval 3. L thigh soft-tissue abscess/ infection:sp I&D 4/10 and repeat I&D 5/2 by gen surg. VAC in place, IV abx vanc/ Zosyn.  ID following. There are new areas of skin breakdown- have spoken with nursing staff, wound c/s to come by again. 4. Abnormal ECHO: Possible vegetation on L coronary cusp per TTE--> TEE without vegetation 1/6 +bcx's for CNSS was felt to be contaminant most likely per ID.  5. ESRD: MWF  HD. Using L arm AVG, HD cath removed 5/7.  4K bath.  Will keep SBP > 105 6. HTN: home coreg on hold, getting q 8hr IV metoprolol.  catapres patch  7. Volume: No recent wt. No LE edema. Running even 8. Anemia: Hgb 9.5rec'd 2u prbc's 06/14/16. Weekly ESA, Aranesp 150 q wednesday. Not candidate for IV Fe with infection/ ^^ferritin.Last tsat 4/19 not calc due to low transferrin. Ferritin 450 >1000>3100 over last 3 mos. Increased Aranesp to 300 to begin next Wed.  Will need another iron panel next week 9. Metabolic bone disease: Ca Holding VDRA/ binders for now 10. Nutrition: Alb very low, getting TF 11. Disposition: Full care.  Bufford Buttner MD [facility] pgr [phone] 06/22/2016, 10:47 AM

## 2016-06-23 DIAGNOSIS — E1365 Other specified diabetes mellitus with hyperglycemia: Secondary | ICD-10-CM

## 2016-06-23 DIAGNOSIS — E1321 Other specified diabetes mellitus with diabetic nephropathy: Secondary | ICD-10-CM

## 2016-06-23 DIAGNOSIS — R7881 Bacteremia: Secondary | ICD-10-CM

## 2016-06-23 LAB — CBC
HEMATOCRIT: 23.6 % — AB (ref 36.0–46.0)
HEMOGLOBIN: 7.2 g/dL — AB (ref 12.0–15.0)
MCH: 27.6 pg (ref 26.0–34.0)
MCHC: 30.5 g/dL (ref 30.0–36.0)
MCV: 90.4 fL (ref 78.0–100.0)
Platelets: 392 10*3/uL (ref 150–400)
RBC: 2.61 MIL/uL — AB (ref 3.87–5.11)
RDW: 19.8 % — ABNORMAL HIGH (ref 11.5–15.5)
WBC: 13.8 10*3/uL — ABNORMAL HIGH (ref 4.0–10.5)

## 2016-06-23 LAB — GLUCOSE, CAPILLARY
GLUCOSE-CAPILLARY: 403 mg/dL — AB (ref 65–99)
Glucose-Capillary: 369 mg/dL — ABNORMAL HIGH (ref 65–99)
Glucose-Capillary: 382 mg/dL — ABNORMAL HIGH (ref 65–99)
Glucose-Capillary: 379 mg/dL — ABNORMAL HIGH (ref 65–99)

## 2016-06-23 LAB — RENAL FUNCTION PANEL
ANION GAP: 14 (ref 5–15)
Albumin: 1.4 g/dL — ABNORMAL LOW (ref 3.5–5.0)
BUN: 62 mg/dL — ABNORMAL HIGH (ref 6–20)
CHLORIDE: 94 mmol/L — AB (ref 101–111)
CO2: 23 mmol/L (ref 22–32)
Calcium: 9 mg/dL (ref 8.9–10.3)
Creatinine, Ser: 3.34 mg/dL — ABNORMAL HIGH (ref 0.44–1.00)
GFR calc non Af Amer: 16 mL/min — ABNORMAL LOW (ref >60)
GFR, EST AFRICAN AMERICAN: 19 mL/min — AB (ref >60)
GLUCOSE: 433 mg/dL — AB (ref 65–99)
POTASSIUM: 4 mmol/L (ref 3.5–5.1)
Phosphorus: 4.4 mg/dL (ref 2.5–4.6)
Sodium: 131 mmol/L — ABNORMAL LOW (ref 135–145)

## 2016-06-23 MED ORDER — INSULIN ASPART 100 UNIT/ML ~~LOC~~ SOLN
0.0000 [IU] | SUBCUTANEOUS | Status: DC
Start: 2016-06-23 — End: 2016-07-16
  Administered 2016-06-23 (×2): 20 [IU] via SUBCUTANEOUS
  Administered 2016-06-23: 7 [IU] via SUBCUTANEOUS
  Administered 2016-06-23: 15 [IU] via SUBCUTANEOUS
  Administered 2016-06-23: 20 [IU] via SUBCUTANEOUS
  Administered 2016-06-24: 3 [IU] via SUBCUTANEOUS
  Administered 2016-06-24: 4 [IU] via SUBCUTANEOUS
  Administered 2016-06-24 (×2): 7 [IU] via SUBCUTANEOUS
  Administered 2016-06-24: 4 [IU] via SUBCUTANEOUS
  Administered 2016-06-25: 3 [IU] via SUBCUTANEOUS
  Administered 2016-06-25 (×5): 4 [IU] via SUBCUTANEOUS
  Administered 2016-06-26: 7 [IU] via SUBCUTANEOUS
  Administered 2016-06-27: 4 [IU] via SUBCUTANEOUS
  Administered 2016-06-27: 7 [IU] via SUBCUTANEOUS
  Administered 2016-06-27: 3 [IU] via SUBCUTANEOUS
  Administered 2016-06-27 – 2016-06-28 (×5): 4 [IU] via SUBCUTANEOUS
  Administered 2016-06-28 (×2): 3 [IU] via SUBCUTANEOUS
  Administered 2016-06-28: 4 [IU] via SUBCUTANEOUS
  Administered 2016-06-29: 7 [IU] via SUBCUTANEOUS
  Administered 2016-06-29 (×2): 4 [IU] via SUBCUTANEOUS
  Administered 2016-06-29: 3 [IU] via SUBCUTANEOUS
  Administered 2016-06-29: 4 [IU] via SUBCUTANEOUS
  Administered 2016-06-30: 7 [IU] via SUBCUTANEOUS
  Administered 2016-06-30 (×3): 4 [IU] via SUBCUTANEOUS
  Administered 2016-06-30: 7 [IU] via SUBCUTANEOUS
  Administered 2016-07-01: 11 [IU] via SUBCUTANEOUS
  Administered 2016-07-01: 7 [IU] via SUBCUTANEOUS
  Administered 2016-07-01: 15 [IU] via SUBCUTANEOUS
  Administered 2016-07-01: 11 [IU] via SUBCUTANEOUS
  Administered 2016-07-01: 7 [IU] via SUBCUTANEOUS
  Administered 2016-07-02: 4 [IU] via SUBCUTANEOUS
  Administered 2016-07-02: 3 [IU] via SUBCUTANEOUS
  Administered 2016-07-02: 4 [IU] via SUBCUTANEOUS
  Administered 2016-07-02: 3 [IU] via SUBCUTANEOUS
  Administered 2016-07-02: 11 [IU] via SUBCUTANEOUS
  Administered 2016-07-02 – 2016-07-03 (×3): 4 [IU] via SUBCUTANEOUS
  Administered 2016-07-03: 7 [IU] via SUBCUTANEOUS
  Administered 2016-07-03 – 2016-07-05 (×7): 4 [IU] via SUBCUTANEOUS
  Administered 2016-07-05: 11 [IU] via SUBCUTANEOUS
  Administered 2016-07-05: 7 [IU] via SUBCUTANEOUS
  Administered 2016-07-05 – 2016-07-07 (×7): 4 [IU] via SUBCUTANEOUS
  Administered 2016-07-07: 3 [IU] via SUBCUTANEOUS
  Administered 2016-07-08: 11 [IU] via SUBCUTANEOUS
  Administered 2016-07-08 (×2): 3 [IU] via SUBCUTANEOUS
  Administered 2016-07-09: 4 [IU] via SUBCUTANEOUS
  Administered 2016-07-09 (×2): 7 [IU] via SUBCUTANEOUS
  Administered 2016-07-09: 4 [IU] via SUBCUTANEOUS
  Administered 2016-07-09: 7 [IU] via SUBCUTANEOUS
  Administered 2016-07-09: 4 [IU] via SUBCUTANEOUS
  Administered 2016-07-09: 7 [IU] via SUBCUTANEOUS
  Administered 2016-07-10 (×2): 4 [IU] via SUBCUTANEOUS
  Administered 2016-07-10: 7 [IU] via SUBCUTANEOUS
  Administered 2016-07-11 (×2): 11 [IU] via SUBCUTANEOUS
  Administered 2016-07-11 (×2): 7 [IU] via SUBCUTANEOUS
  Administered 2016-07-12: 4 [IU] via SUBCUTANEOUS
  Administered 2016-07-12: 11 [IU] via SUBCUTANEOUS
  Administered 2016-07-12 – 2016-07-13 (×4): 7 [IU] via SUBCUTANEOUS
  Administered 2016-07-15: 4 [IU] via SUBCUTANEOUS
  Administered 2016-07-15: 3 [IU] via SUBCUTANEOUS

## 2016-06-23 MED ORDER — HEPARIN SODIUM (PORCINE) 1000 UNIT/ML DIALYSIS
1000.0000 [IU] | INTRAMUSCULAR | Status: DC | PRN
  Filled 2016-06-23: qty 1

## 2016-06-23 MED ORDER — SUCROFERRIC OXYHYDROXIDE 500 MG PO CHEW
500.0000 mg | CHEWABLE_TABLET | Freq: Three times a day (TID) | ORAL | Status: DC
  Administered 2016-06-24 – 2016-06-29 (×12): 500 mg via ORAL
  Filled 2016-06-23 (×21): qty 1

## 2016-06-23 MED ORDER — GERHARDT'S BUTT CREAM
TOPICAL_CREAM | Freq: Three times a day (TID) | CUTANEOUS | Status: DC | PRN
  Administered 2016-06-23: 18:00:00 via TOPICAL
  Filled 2016-06-23: qty 1

## 2016-06-23 MED ORDER — SODIUM CHLORIDE 0.9 % IV SOLN: 100.0000 mL | INTRAVENOUS | Status: DC | PRN

## 2016-06-23 MED ORDER — CALCITRIOL 0.25 MCG PO CAPS: 0.2500 ug | ORAL_CAPSULE | ORAL | Status: DC

## 2016-06-23 MED ORDER — ALTEPLASE 2 MG IJ SOLR: 2.0000 mg | Freq: Once | INTRAMUSCULAR | Status: DC | PRN

## 2016-06-23 MED ORDER — PENTAFLUOROPROP-TETRAFLUOROETH EX AERO: 1.0000 "application " | INHALATION_SPRAY | CUTANEOUS | Status: DC | PRN

## 2016-06-23 MED ORDER — LIDOCAINE HCL (PF) 1 % IJ SOLN: 5.0000 mL | INTRAMUSCULAR | Status: DC | PRN

## 2016-06-23 MED ORDER — LIDOCAINE-PRILOCAINE 2.5-2.5 % EX CREA
1.0000 "application " | TOPICAL_CREAM | CUTANEOUS | Status: DC | PRN
  Filled 2016-06-23: qty 5

## 2016-06-23 MED ORDER — INSULIN GLARGINE 100 UNIT/ML ~~LOC~~ SOLN
35.0000 [IU] | Freq: Two times a day (BID) | SUBCUTANEOUS | Status: DC
  Administered 2016-06-23 – 2016-07-13 (×36): 35 [IU] via SUBCUTANEOUS
  Filled 2016-06-23 (×44): qty 0.35

## 2016-06-23 MED ORDER — HEPARIN SODIUM (PORCINE) 1000 UNIT/ML DIALYSIS
4400.0000 [IU] | Freq: Once | INTRAMUSCULAR | Status: AC
Start: 2016-06-24 — End: 2016-06-24
  Administered 2016-06-24: 4400 [IU] via INTRAVENOUS_CENTRAL
  Filled 2016-06-23: qty 5

## 2016-06-23 NOTE — Progress Notes (Signed)
[facility] Progress Note   Subjective: sleeping, appears comfortable.    Objective Vitals:   06/22/16 1946 06/22/16 2355 06/23/16 0605 06/23/16 0829  BP: (!) 112/58 (!) 116/59 126/70 (!) 144/84  Pulse: (!) 112 (!) 111 (!) 111 (!) 109  Resp: (!) 21 20 (!) 22 (!) 23  Temp: 98.5 F (36.9 C) 98.6 F (37 C) 97.7 F (36.5 C) 98.8 F (37.1 C)  TempSrc: Oral Oral Oral Axillary  SpO2: 98% 92% 97% 97%  Height:       Physical Exam General: eyes closed Heart: RRR Lungs: CTAB A/P Abdomen: Active BS. Flexaseal with brown stool.  Extremities: Wound vac L thigh. Heel protectors BLE. No edema.  Diffuse sarcopenia  Picture of wound reviewed in epic- appears to have had some necrotic tissue which has debrided.  R thigh with some induration which is worsening and hyperpigmented tissue, new erosion and area of skin breakdown noted intertriginous area in groin Dialysis Access: RIJ TDC has been removed. LUA AVG + bruit   Additional Objective Labs: Basic Metabolic Panel:  Recent Labs Lab May 08, 2016 0151 06/19/16 0845 06/21/16 1330  NA 132* 135 136  K 4.1 3.3* 3.9  CL 95* 97* 98*  CO2 25 27 21*  GLUCOSE 303* 157* 162*  BUN 91* 54* 64*  CREATININE 3.56* 2.44* 3.69*  CALCIUM 9.1 8.7* 9.1  PHOS  --  2.5 5.3*   Liver Function Tests:  Recent Labs Lab 06/19/16 0845 06/21/16 1330  ALBUMIN 1.4* 1.3*   No results for input(s): LIPASE, AMYLASE in the last 168 hours. CBC:  Recent Labs Lab May 08, 2016 0151 06/19/16 0845 06/20/16 0314 06/21/16 1330  WBC 15.2* 13.1* 14.8* 16.3*  HGB 8.9* 7.7* 8.8* 7.1*  HCT 29.0* 26.1* 29.5* 24.0*  MCV 87.3 89.1 89.9 88.6  PLT 257 368 343 422*   Blood Culture    Component Value Date/Time   SDES WOUND 06/21/2016 1304   SPECREQUEST LEFT THIGH ABSCESS 06/30/2016 1304   CULT (A) 06/24/2016 1304    MULTIPLE ORGANISMS PRESENT, NONE PREDOMINANT NO ANAEROBES ISOLATED    REPTSTATUS 06/14/2016 FINAL 06/16/2016 1304    Cardiac  Enzymes: No results for input(s): CKTOTAL, CKMB, CKMBINDEX, TROPONINI in the last 168 hours. CBG:  Recent Labs Lab 06/22/16 1519 06/22/16 1946 06/22/16 2333 06/23/16 0406 06/23/16 0855  GLUCAP 388* 374* 384* 369* 403*   Studies/Results: No results found. Medications: . piperacillin-tazobactam (ZOSYN)  IV Stopped (06/23/16 0146)  . sodium chloride    . vancomycin 1,000 mg (06/21/16 1711)   . aspirin  325 mg Per Tube Daily  . atorvastatin  20 mg Per Tube QPM  . chlorhexidine  15 mL Mouth Rinse BID  . [START ON 06/26/2016] darbepoetin (ARANESP) injection - DIALYSIS  300 mcg Intravenous Q Wed-HD  . feeding supplement (NEPRO CARB STEADY)  1,000 mL Oral Q24H  . feeding supplement (PRO-STAT SUGAR FREE 64)  30 mL Per Tube TID  . FLUoxetine  20 mg Per Tube Daily  . heparin subcutaneous  5,000 Units Subcutaneous Q8H  . insulin aspart  0-20 Units Subcutaneous Q4H  . insulin glargine  35 Units Subcutaneous BID  . Zinc Oxide   Topical BID     D[facility]: [facility]  4:30 hours, 400/800 105.5kg 3K/2Ca TDC R IJ/ maturing LUA AVG Hep 4400 - Mircera 150mcg IV q 2 weeks (last given 4/25) - Calcitriol 0.3025mcg PO q HD   Assessment/Plan: 1. Acute multifocal CVAs:Plan per primary.  No vegetations on TEE.  Has chronic R  intracranial carotid occlusion and L vertebral occlusion.  ? If recurrent multifocal infarcts are due to hypotensive ischemic events.  Her repeat CT shows age-advanced atrophy and no new infarcts. 2. AMS: poorly responsive, CVA +/- infection.  Would consider neuro eval 3. L thigh soft-tissue abscess/ infection:sp I&D 4/10 and repeat I&D 5/2 by gen surg. VAC in place, IV abx vanc/ Zosyn.  ID following. There are new areas of skin breakdown, and her induration on her R thigh seems worse--> ? if she will need more debridements 4. Abnormal ECHO: Possible vegetation on L coronary cusp per TTE--> TEE without vegetation 1/6 +bcx's for CNSS was felt to be contaminant most likely  per ID.  5. ESRD: MWF HD. Using L arm AVG, will try 15 g needles HD cath removed 5/7.  4K bath.  Will keep SBP > 110 6. HTN: home coreg on hold, getting prns of hydral and metop  7. Volume: No recent wt. No LE edema. Running even 8. Anemia: Hgb 9.5rec'd 2u prbc's 06/14/16. Weekly ESA, Aranesp 150 q wednesday. Not candidate for IV Fe with infection/ ^^ferritin.Last tsat 4/19 not calc due to low transferrin. Ferritin 450 >1000>3100 over last 3 mos. Increased Aranesp to 300 to begin next Wed.  Will need another iron panel next week 9. Metabolic bone disease: Ca 9.1, alb 1.3, corrected is a little high, will use low Ca bath and hold calcitriol.  Noncalcium based binder (velphoro 1 TID) 10. Nutrition: Alb very low, getting TF 11. Disposition: Full care.  Bufford Buttner MD [facility] pgr [phone] 06/23/2016, 9:43 AM

## 2016-06-23 NOTE — Progress Notes (Signed)
Pharmacy Antibiotic Note  [name] is a 41 y.o. female admitted on 05/17/2016 with sepsis. D14 of abx for possible sepsis; found with possible vegetation on L coronary cusp on TTE, TEE showed no vegetation. LLE thigh wound - s/p I&D 4/10 and repeat 5/1. Afeb, WBC 13.1 (trending down). Has not been tolerating full rate HD sessions (last 5/11 4 hours at BFR of 250), making it difficult to calculate vancomycin levels. As such, will get vanc random tomorrow prior to HD and adjust therapy as appropriate.   Plan: Get VR tomorrow am Continue vancomycin 1g IV QHD-MWF with Friday's HD session Continue Zosyn 3.375 gm IV q12h (4 hour infusion) Monitor clinical picture, HD sessions, pre-HD level as needed Per ID's note, total length of therapy for 30 days   Height: 5\' 2"  (157.5 cm) Weight:  (bed scale not working) IBW/kg (Calculated) : 50.1  Temp (24hrs), Avg:99 F (37.2 C), Min:97.7 F (36.5 C), Max:100.2 F (37.9 C)   Recent Labs Lab 06/29/2016 0151 06/19/16 0845 06/20/16 0314 06/21/16 1330  WBC 15.2* 13.1* 14.8* 16.3*  CREATININE 3.56* 2.44*  --  3.69*  VANCORANDOM  --  29  --   --     Estimated Creatinine Clearance: 23 mL/min (A) (by C-G formula based on SCr of 3.69 mg/dL (H)).    Allergies  Allergen Reactions  . Azithromycin Other (See Comments)    Nose bleeding event   Vancomycin 4/30 >> (5/28) Zosyn 4/30>> 5/2; 5/7 >> (5/28) Cefepime 5/2 >> 5/7  5/9 preHD VR 29  4/10 wound culture: mult orgs present, abundant bacteroides ovatus beta lactamase + RPR Reactive: T Pallidum Abx neg, likely false positive RPR 4/30 BCx: ngtd 5/1 LLE abscess: few GNR> mult spec present  Alfredo BachJoseph Chevelle Coulson, BS, PharmD Clinical Pharmacy Resident [phone] (Pager) 06/23/2016 10:54 AM

## 2016-06-23 NOTE — Progress Notes (Signed)
[facility] TEAM 1 - Stepdown/ICU TEAM  Kimanh Templeman  ZOX:[id number] DOB: [birth year] DOA: 05/26/2016 PCP: Marletta Lor, NP    Brief Narrative:  41 y.o.woman with a history of CVA, DM2, morbid obesity, chronic lymphedema, HTN, chronic diastolic heart failure, depression, and admission 3/21 > 4/20 for acute encephalopathy, acute renal failure on CKD 5 requiring HD, decompensated heart failure, and infected leg ulcer.  She was discharged to a SNF.  She had been dialyzing on MWF.  Upon completion of her HD session on the day of presentation she was noted to have mental status changes.  It was not clear to the personnel when she was last known normal.  She was transferred to the ED where she had a WBC count of 20, Hgb 9, Ammonia level normal.  Head CT was negative for acute process.  MRI/MRA noted acute right deep gray nuclei and right hippocampal infarcts with an occluded R MCA, Right ICA, and Left vertebral artery.  Subjective: Patient has eyes open, but does not follow commands or respond to voice  Assessment & Plan:  Acute metabolic encephalopathy in setting of chronic encephalopathy During last admit mental status apparently waxed and waned - Psych saw in consultation and suggested tx for depression at that time - her MS is not improving, making it less likely to be connected to her sepsis - I too am concerned as is Palliative Care that this may be a result of her confirmed acute CVAs at presentation - avoiding sedating meds - repeat head imaging w/ CT 5/8 noted no new findings - this appears to be the patient's new baseline. Her prognosis appears to be more. May need to recall palliative care.  Acute multifocal CVAs with history of multiple prior CVAs Reportedly bedbound at baseline due to prior CVAs - Neurology has evaluated and since signed off - they felt her AMS was out of proportion to her CVAs - I suspect her CVAs may in fact be the cause of her AMS  L posterior/medial thigh  wound s/p debridement by Len Childs 05/21/16 - completed a course of abx tx on 05/31/16 - CT of the thigh this admit noted a new area c/w an abscess not communicating with the currently opened area - Gen Surg took her to the OR 06/21/2016 - broad abx coverage continues as suggested by ID - wound care per Gen Surgery   ?SBE - Possible 7x83mm vegetation L coronary cusp (per TTE) - ruled out via TEE Blood cx during last admit 1 of 6 + for coag neg Staph strongly suggestive of contaminant - no such mass noted on TTE 03/22/16 - TEE 07/01/2016 w/o evidence of SBE/vegetation - abx per ID    ESRD on HD Nephrology attending to HD M/W/F   Reactive RPR > false + Quant f/u RPR 1:1 w/ negative T pallidum abs - ID feels this is a false +  Morbid obesity - Body mass index is 41.41 kg/m.   Nutrition  s/p PEG placement in IR 5/10 - tube feeds infusing w/o difficulty presently   HTN wish to avoid hypotension during HD due to persisting cerebrovascular disease. Currently antihypertensives ordered as needed. Blood pressures have been stable   DM CBG not well controlled at this time - change sliding scale to resistant coverage and increase basal insulin   Chronic diastolic congestive heart failure TTE 4/28 noted EF 55-60% w/ grade 1 DD - EDW per HD center is 105.5kg - no signif volume overload at this time -  volume management per hemodialysis   DVT prophylaxis: [place] heparin  Code Status: FULL CODE Family Communication: no family present at time of exam  Disposition Plan: SDU  Consultants:  Neurology  Gen Surgery  Nephrology   Procedures: 5/10 PEG tube placed in IR  Antimicrobials:  Zosyn 4/30 > 5/02 + 5/07 > Cefepime 5/02 > 5/06 Vanc 4/30 >   Objective: Blood pressure (!) 144/84, pulse (!) 109, temperature 98.8 F (37.1 C), temperature source Axillary, resp. rate (!) 23, height 5\' 2"  (1.575 m), weight 102.7 kg (226 lb 6.6 oz), SpO2 97 %.  Intake/Output Summary (Last 24 hours) at 06/23/16 1103 Last data  filed at 06/22/16 1648  Gross per 24 hour  Intake              360 ml  Output                0 ml  Net              360 ml   Filed Weights    Examination: General exam: no distress Respiratory system: Clear to auscultation. Respiratory effort normal. Cardiovascular system:RRR. No murmurs, rubs, gallops. Gastrointestinal system: Abdomen is nondistended, soft and nontender. No organomegaly or masses felt. Normal bowel sounds heard. Central nervous system: does not follow commands Extremities: No C/C/E, +pedal pulses Skin: large wound no left thigh with wound vac in place      CBC:  Recent Labs Lab 06/12/2016 0151 06/19/16 0845 06/20/16 0314 06/21/16 1330  WBC 15.2* 13.1* 14.8* 16.3*  HGB 8.9* 7.7* 8.8* 7.1*  HCT 29.0* 26.1* 29.5* 24.0*  MCV 87.3 89.1 89.9 88.6  PLT 257 368 343 422*   Basic Metabolic Panel:  Recent Labs Lab 06/16/2016 0151 06/19/16 0845 06/21/16 1330  NA 132* 135 136  K 4.1 3.3* 3.9  CL 95* 97* 98*  CO2 25 27 21*  GLUCOSE 303* 157* 162*  BUN 91* 54* 64*  CREATININE 3.56* 2.44* 3.69*  CALCIUM 9.1 8.7* 9.1  MG 2.3  --   --   PHOS  --  2.5 5.3*   GFR: Estimated Creatinine Clearance: 23 mL/min (A) (by C-G formula based on SCr of 3.69 mg/dL (H)).  Liver Function Tests:  Recent Labs Lab 06/19/16 0845 06/21/16 1330  ALBUMIN 1.4* 1.3*    HbA1C: Hgb A1c MFr Bld  Date/Time Value Ref Range Status  06/09/2016 02:58 AM 6.7 (H) 4.8 - 5.6 % Final    Comment:    (NOTE)         Pre-diabetes: 5.7 - 6.4         Diabetes: >6.4         Glycemic control for adults with diabetes: <7.0   06/08/2016 04:48 AM 6.6 (H) 4.8 - 5.6 % Final    Comment:    (NOTE)         Pre-diabetes: 5.7 - 6.4         Diabetes: >6.4         Glycemic control for adults with diabetes: <7.0     CBG:  Recent Labs Lab 06/22/16 1519 06/22/16 1946 06/22/16 2333 06/23/16 0406 06/23/16 0855  GLUCAP 388* 374* 384* 369* 403*    Scheduled Meds: . aspirin  325 mg Per Tube  Daily  . atorvastatin  20 mg Per Tube QPM  . chlorhexidine  15 mL Mouth Rinse BID  . [START ON 06/26/2016] darbepoetin (ARANESP) injection - DIALYSIS  300 mcg Intravenous Q Wed-HD  . feeding supplement (NEPRO  CARB STEADY)  1,000 mL Oral Q24H  . feeding supplement (PRO-STAT SUGAR FREE 64)  30 mL Per Tube TID  . FLUoxetine  20 mg Per Tube Daily  . [START ON 06/24/2016] heparin  4,400 Units Dialysis Once in dialysis  . heparin subcutaneous  5,000 Units Subcutaneous Q8H  . insulin aspart  0-20 Units Subcutaneous Q4H  . insulin glargine  35 Units Subcutaneous BID  . sucroferric oxyhydroxide  500 mg Oral TID WC  . Zinc Oxide   Topical BID     LOS: 15 days    Erick Blinks, MD [facility] Office  [phone] Pager - Text Page per Loretha Stapler as per below:  On-Call/Text Page:      [email or web address]      password [email or web address]  If 7PM-7AM, please contact night-coverage [email or web address] Password [email or web address] 06/23/2016, 11:03 AM

## 2016-06-24 DIAGNOSIS — Z515 Encounter for palliative care: Secondary | ICD-10-CM

## 2016-06-24 LAB — BASIC METABOLIC PANEL
ANION GAP: 19 — AB (ref 5–15)
BUN: 84 mg/dL — ABNORMAL HIGH (ref 6–20)
CALCIUM: 9.2 mg/dL (ref 8.9–10.3)
CHLORIDE: 95 mmol/L — AB (ref 101–111)
CO2: 21 mmol/L — AB (ref 22–32)
Creatinine, Ser: 3.65 mg/dL — ABNORMAL HIGH (ref 0.44–1.00)
GFR calc non Af Amer: 15 mL/min — ABNORMAL LOW (ref >60)
GFR, EST AFRICAN AMERICAN: 17 mL/min — AB (ref >60)
Glucose, Bld: 230 mg/dL — ABNORMAL HIGH (ref 65–99)
Potassium: 3.8 mmol/L (ref 3.5–5.1)
SODIUM: 135 mmol/L (ref 135–145)

## 2016-06-24 LAB — FERRITIN: FERRITIN: 4098 ng/mL — AB (ref 11–307)

## 2016-06-24 LAB — GLUCOSE, CAPILLARY
GLUCOSE-CAPILLARY: 305 mg/dL — AB (ref 65–99)
GLUCOSE-CAPILLARY: 248 mg/dL — AB (ref 65–99)
GLUCOSE-CAPILLARY: 224 mg/dL — AB (ref 65–99)
GLUCOSE-CAPILLARY: 151 mg/dL — AB (ref 65–99)
Glucose-Capillary: 214 mg/dL — ABNORMAL HIGH (ref 65–99)
Glucose-Capillary: 147 mg/dL — ABNORMAL HIGH (ref 65–99)

## 2016-06-24 LAB — CBC
HEMATOCRIT: 27.2 % — AB (ref 36.0–46.0)
Hemoglobin: 8.3 g/dL — ABNORMAL LOW (ref 12.0–15.0)
MCH: 28.1 pg (ref 26.0–34.0)
MCHC: 30.5 g/dL (ref 30.0–36.0)
MCV: 92.2 fL (ref 78.0–100.0)
Platelets: 375 10*3/uL (ref 150–400)
RBC: 2.95 MIL/uL — AB (ref 3.87–5.11)
RDW: 20.3 % — AB (ref 11.5–15.5)
WBC: 16 10*3/uL — ABNORMAL HIGH (ref 4.0–10.5)

## 2016-06-24 LAB — IRON AND TIBC
IRON: 29 ug/dL (ref 28–170)
SATURATION RATIOS: 27 % (ref 10.4–31.8)
TIBC: 108 ug/dL — AB (ref 250–450)
UIBC: 79 ug/dL

## 2016-06-24 LAB — VANCOMYCIN, RANDOM: Vancomycin Rm: 23

## 2016-06-24 MED ORDER — VANCOMYCIN HCL IN DEXTROSE 1-5 GM/200ML-% IV SOLN
INTRAVENOUS | Status: AC
  Administered 2016-06-24: 1000 mg via INTRAVENOUS
  Filled 2016-06-24: qty 200

## 2016-06-24 NOTE — Progress Notes (Signed)
[facility] Progress Note  7 Days Post-Op  Subjective: CC: encephalopathy Spontaneously opens eyes but does not follow commands. Non-verbal. Minimally responsive to painful stimuli.   Objective: Vital signs in last 24 hours: Temp:  [98.2 F (36.8 C)-98.7 F (37.1 C)] 98.2 F (36.8 C) (05/14 0710) Pulse Rate:  [104-115] 104 (05/14 0710) Resp:  [19-23] 19 (05/14 0710) BP: (121-148)/(75-93) 129/93 (05/14 0710) SpO2:  [98 %-100 %] 99 % (05/14 0710) Last BM Date: 06/22/16  Intake/Output from previous day: 05/13 0701 - 05/14 0700 In: 1450 [NG/GT:1450] Out: 25 [Drains:25] Intake/Output this shift: No intake/output data recorded.  PE: Gen: no acute distress, non-verbal Left thigh wound: improving, >80% granulation tissue    Right medial thigh:   Roughly 10 x 4 cm area of discoloration and induration. No palpable warmth or fluctuance. No cellulitis. Not obviously tender.   Lab Results:   Recent Labs  06/23/16 1229 06/24/16 0224  WBC 13.8* 16.0*  HGB 7.2* 8.3*  HCT 23.6* 27.2*  PLT 392 375   BMET  Recent Labs  06/23/16 1229 06/24/16 0224  NA 131* 135  K 4.0 3.8  CL 94* 95*  CO2 23 21*  GLUCOSE 433* 230*  BUN 62* 84*  CREATININE 3.34* 3.65*  CALCIUM 9.0 9.2   PT/INR No results for input(s): LABPROT, INR in the last 72 hours. CMP     Component Value Date/Time   NA 135 06/24/2016 0224   NA 137 09/16/2013 1301   K 3.8 06/24/2016 0224   K 4.7 09/16/2013 1301   CL 95 (L) 06/24/2016 0224   CL 108 (H) 09/16/2013 1301   CO2 21 (L) 06/24/2016 0224   CO2 24 09/16/2013 1301   GLUCOSE 230 (H) 06/24/2016 0224   GLUCOSE 125 (H) 09/16/2013 1301   BUN 84 (H) 06/24/2016 0224   BUN 15 09/16/2013 1301   CREATININE 3.65 (H) 06/24/2016 0224   CREATININE 1.79 (H) 09/16/2013 1301   CALCIUM 9.2 06/24/2016 0224   CALCIUM 8.3 (L) 09/16/2013 1301   PROT 8.3 (H) 06/12/2016 0236   PROT 7.0 09/16/2013 1301   ALBUMIN 1.4 (L) 06/23/2016 1229   ALBUMIN 1.3 (L)  09/16/2013 1301   AST 48 (H) 06/12/2016 0236   AST 10 (L) 09/16/2013 1301   ALT 36 06/12/2016 0236   ALT 11 (L) 09/16/2013 1301   ALKPHOS 167 (H) 06/12/2016 0236   ALKPHOS 59 09/16/2013 1301   BILITOT 1.2 06/12/2016 0236   BILITOT 0.2 09/16/2013 1301   GFRNONAA 15 (L) 06/24/2016 0224   GFRNONAA 36 (L) 09/16/2013 1301   GFRAA 17 (L) 06/24/2016 0224   GFRAA 42 (L) 09/16/2013 1301   Lipase  No results found for: LIPASE     Studies/Results: No results found.  Anti-infectives: Anti-infectives    Start     Dose/Rate Route Frequency Ordered Stop   06/21/16 1200  vancomycin (VANCOCIN) IVPB 1000 mg/200 mL premix     1,000 mg 200 mL/hr over 60 Minutes Intravenous Every M-W-F (Hemodialysis) 06/19/16 1009 07/12/16 1159   06/20/16 0600  ceFAZolin (ANCEF) IVPB 2g/100 mL premix  Status:  Discontinued     2 g 200 mL/hr over 30 Minutes Intravenous To Radiology 06/19/16 1427 06/21/16 0600   06/19/16 1200  vancomycin (VANCOCIN) IVPB 750 mg/150 ml premix     750 mg 150 mL/hr over 60 Minutes Intravenous Every M-W-F (Hemodialysis) 06/19/16 1009 06/19/16 1301   06/19/16 1158  Vancomycin (VANCOCIN) 750-5 MG/150ML-% IVPB    Comments:  Herriott, [name]   :  cabinet override      06/19/16 1158 06/19/16 1215   06/25/2016 1300  piperacillin-tazobactam (ZOSYN) IVPB 3.375 g     3.375 g 12.5 mL/hr over 240 Minutes Intravenous Every 12 hours 06/13/2016 1231 07/08/16 2359   06/14/16 0931  vancomycin (VANCOCIN) 1-5 GM/200ML-% IVPB    Comments:  Bluford Kaufmann   : cabinet override      06/14/16 0931 06/14/16 1113   06/12/16 1700  ceFEPIme (MAXIPIME) 1 g in dextrose 5 % 50 mL IVPB  Status:  Discontinued     1 g 100 mL/hr over 30 Minutes Intravenous Every 24 hours 06/12/16 1648 07/09/2016 1205   06/12/16 0959  vancomycin (VANCOCIN) 1-5 GM/200ML-% IVPB    Comments:  Carlyon Prows   : cabinet override      06/12/16 0959 06/12/16 1015   06/10/16 1200  vancomycin (VANCOCIN) IVPB 1000 mg/200 mL premix  Status:   Discontinued     1,000 mg 200 mL/hr over 60 Minutes Intravenous Every M-W-F (Hemodialysis) 06/10/16 0851 06/19/16 1009   06/10/16 1000  piperacillin-tazobactam (ZOSYN) IVPB 3.375 g  Status:  Discontinued     3.375 g 12.5 mL/hr over 240 Minutes Intravenous Every 12 hours 06/10/16 0851 06/12/16 1629   06/10/16 1000  vancomycin (VANCOCIN) 2,250 mg in sodium chloride 0.9 % 500 mL IVPB  Status:  Discontinued     2,250 mg 250 mL/hr over 120 Minutes Intravenous Every 12 hours 06/10/16 0851 06/10/16 0858   06/10/16 1000  vancomycin (VANCOCIN) 2,250 mg in sodium chloride 0.9 % 500 mL IVPB     2,250 mg 250 mL/hr over 120 Minutes Intravenous  Once 06/10/16 0858 06/10/16 1640       Assessment/Plan Left thigh soft tissue infection s/p excision of 15x6x2.5 cm area skin and subcutaneous tissue of left thigh 05/21/16, Dr. Emelia Loron s/p Excision of skin and subcutaneous tissue of left thigh (25 x 8 x 3 cm), 06/14/2016, Dr. Manus Rudd - afebrile, Leukocytosis stable (16) - continue VAC changes M/W/F  Skin changes right thigh - not obviously causing patient pain. No clinical signs/sxs infection. Acute surgical intervention is not indicated. Continue with daily hygiene. We will follow. Should infectious symptoms develop, I recommend re-addressing goals of care with family prior to considering operative intervention.   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

## 2016-06-24 NOTE — Progress Notes (Signed)
Hemodialysis: 15 mins into pts HD tx HR up to 130's from 104, Dr. Arlean HoppingSchertz at bedside.  Orders given to give pt bolus of 500cc normal saline and do not pull any fluid today. Bolus given, UF off, will continue to monitor.

## 2016-06-24 NOTE — Progress Notes (Signed)
Hemodialysis: BP back up to pts norm, Uf remains off per Dr. Arlean HoppingSchertz.  See flowsheet for VS.

## 2016-06-24 NOTE — Progress Notes (Signed)
Hemodialysis: BP low, UF remains off, saline bolus given.

## 2016-06-24 NOTE — Progress Notes (Signed)
[facility] Progress Note   Subjective: no c/o's, on HD  Vitals:   06/24/16 1000 06/24/16 1030 06/24/16 1045 06/24/16 1100  BP: 120/84 (!) 91/42 136/75 (!) 144/83  Pulse: (!) 115 (!) 119 (!) 121 (!) 117  Resp:      Temp:      TempSrc:      SpO2: 98% 99%  98%  Height:        Inpatient medications: . aspirin  325 mg Per Tube Daily  . atorvastatin  20 mg Per Tube QPM  . chlorhexidine  15 mL Mouth Rinse BID  . [START ON 06/26/2016] darbepoetin (ARANESP) injection - DIALYSIS  300 mcg Intravenous Q Wed-HD  . feeding supplement (NEPRO CARB STEADY)  1,000 mL Oral Q24H  . feeding supplement (PRO-STAT SUGAR FREE 64)  30 mL Per Tube TID  . FLUoxetine  20 mg Per Tube Daily  . heparin subcutaneous  5,000 Units Subcutaneous Q8H  . insulin aspart  0-20 Units Subcutaneous Q4H  . insulin glargine  35 Units Subcutaneous BID  . sucroferric oxyhydroxide  500 mg Oral TID WC  . Zinc Oxide   Topical BID   . sodium chloride    . sodium chloride    . piperacillin-tazobactam (ZOSYN)  IV Stopped (06/24/16 0207)  . sodium chloride    . vancomycin 1,000 mg (06/21/16 1711)   sodium chloride, sodium chloride, [DISCONTINUED] acetaminophen **OR** [DISCONTINUED] acetaminophen (TYLENOL) oral liquid 160 mg/5 mL **OR** acetaminophen, acetaminophen, alteplase, Gerhardt's butt cream, glucagon (human recombinant), heparin, hydrALAZINE, HYDROcodone-acetaminophen, HYDROmorphone (DILAUDID) injection, lidocaine (PF), lidocaine-prilocaine, metoprolol, ondansetron (ZOFRAN) IV, pentafluoroprop-tetrafluoroeth  Exam: Eyes open, not following commands Chest CTAB RRR no rmg Abd obese soft ntnd Ext no LE edema, wound VAC L thigh LUA AVG +bruit   Dialysis: [facility]WF [facility]  4.5h   400/800 105.5kg 3K/2Ca TDC R IJ/ maturing LUA AVG Hep 4400 - Mircera 150mcg IV q 2 weeks (last given 4/25) - Calcitriol 0.5525mcg PO q HD      Assessment: 1  Acute CVA - chronic R intracranial occlusion and L vertebral  occlusion 2  AMS remains poorly repsonsive 3  ESRD MWF hd - keep SBP > 110 4  L thigh soft-tissue abscess, sp I&D repeated 5  ID possible veg on TTE, not seen on TEE; plan 4 wks empiric abx per ID 6  HTN home coreg on hold, using prn IV meds 7  Anemia of CKD - max darbe 300 / wk, no IV Fe given infection 8  MBD - low Ca bath and no vit D as serum Ca running high here. nonCa+ binders.   Plan - as above   Vinson Moselleob Larin Depaoli MD [facility] pager [phone]   06/24/2016, 11:09 AM    Recent Labs Lab 06/19/16 0845 06/21/16 1330 06/23/16 1229 06/24/16 0224  NA 135 136 131* 135  K 3.3* 3.9 4.0 3.8  CL 97* 98* 94* 95*  CO2 27 21* 23 21*  GLUCOSE 157* 162* 433* 230*  BUN 54* 64* 62* 84*  CREATININE 2.44* 3.69* 3.34* 3.65*  CALCIUM 8.7* 9.1 9.0 9.2  PHOS 2.5 5.3* 4.4  --     Recent Labs Lab 06/19/16 0845 06/21/16 1330 06/23/16 1229  ALBUMIN 1.4* 1.3* 1.4*    Recent Labs Lab 06/21/16 1330 06/23/16 1229 06/24/16 0224  WBC 16.3* 13.8* 16.0*  HGB 7.1* 7.2* 8.3*  HCT 24.0* 23.6* 27.2*  MCV 88.6 90.4 92.2  PLT 422* 392 375   Iron/TIBC/Ferritin/ %Sat    Component  Value Date/Time   IRON 29 06/24/2016 0224   TIBC 108 (L) 06/24/2016 0224   FERRITIN 4,098 (H) 06/24/2016 0224   IRONPCTSAT 27 06/24/2016 0224

## 2016-06-24 NOTE — Progress Notes (Signed)
Hemodialysis: HR down to 113 after bolus given.

## 2016-06-24 NOTE — Progress Notes (Signed)
Palliative Care Follow up  See Goals of Care Note from 06/14/16. I have touched base with her husband - there is almost no change in her condition.  Would be helpful to obtain another PT and OT evaluation- I am not sure that she "cannot move" or is severely unmotivated. She does follow some basic command with great difficulty.  Will she be able to do outpatient HD in this condition? Family has stated they do not want her to return to [facility], they expressed concern about the care she received there. She would be LTACH appropriate given her wounds,HD and other needs. Family needs more time to process the severity of her condition and that it may in fact never improve.  Time: 25 minutes Greater than 50%  of this time was spent counseling and coordinating care related to the above assessment and plan.  Lane Hacker, DO Palliative Medicine     Copied Note From Palliative Family Meeting on 06/14/2016: Palliative Care Family Meeting  I met with Laporchia's husband, sister, brother and mother to update them on her condition and to clarify goals of care. Dail has continued to decline in terms of her mental status and condition. She has severe apathy and is no longer able to communicate or consistently follow commands. She continues to tolerate and receive hemodialysis- limiting factor and major issue here is her change in mental status. She has been hospitalized since 4/27 admitted after only 5 days at [facility] and is on her 5th hospitalization in the last 6 months. Prior to 2 months ago she was a functioning person and mother to her 64 year old, 82 year old and 40 year old who are at home with her husband (childrens father).   This family had little or no understanding of the severity of her current condition-they were shocked and continued to ask me when she would return home. Very low health literacy a major challenge-especially in explaining the complexity of her condition and how it  got this bad-they truly felt she was doing well- so my discussion with them today will need to settle before we can make and decisions. They are definitely not in a place where they would even be open to discussing a comfort care transition or de-escalation on interventions. Given 41 young age, her reported functional status about two months ago it may be reasonable to give give this more time- the family will need this in general and are asking for every possible or reasonable medical intervention be done-they simply cannot accept that the outcome here is most likely going to be very bad and that she may not "make it". They became extremely emotional and tearful when I told them this may actually be Tineshia's new baseline existence-they are adamant that "we have to get her better" because she "has young kids to take care of". Her husband is overwhelmed but he definitely heard me today about how serious this is- his 40 yo is having emotional problems without her mom and he has no resources for childcare for his youngest children. He himself is having a hard time functioning without her.  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 I believe this can explain very clearly her mental status-given location of stroke-subclinical seizure a concern as well as severe depression (her SSRIs were held on admission). Since her mental status problems have persisted may  be reasonable to have neurology weigh in again and consider worsening of her stroke or additional strokes.   Continue Full Scope Treatment  Full Code  Consider Neurology re-consult HB:[id number], need for repeat imaging or EEG? Rehab potential? Projected neurological outcomes?  Family do want PEG and artifical nutrition to be started.  Medication Reconciliation needs to be done-she has a Cortrak so meds can be given per tube- I restarted her  SSRI, this had been stopped at some point- will use fluoxitine since it is the most activating SSRI available. Would like to try a stimulant such as provigil or ritalin but need to make sure we have good control over her blood pressure and that from a cardiac standpoint she is clear for this.   Clonidine can be very sedating and I would consider changing this medication to a per tube agent for BP control.  Consider having LTACHs evaluate her as a potential patient- she is going to have a difficult course and a long road ahead. She cannot dialyze outpatient in her current condition.  I started her on scheduled Tylenol for pain-hx of chronic back pain  Will request CSW send a referral to [place] for counseling for her 40,22 and 40 year old.  This family will need ongoing discussions and education about her condition.   Time: 60 minutes

## 2016-06-24 NOTE — Progress Notes (Signed)
PT Cancellation Note  Patient Details Name: [name] MRN: [id number] DOB: 06/14/1975   Cancelled Treatment:    Reason Eval/Treat Not Completed: Patient at procedure or test/unavailable; patient in hemodialysis.  Will attempt later as time permits.   Elray McgregorCynthia Wynn 06/24/2016, 10:37 AM Sheran Lawlessyndi Wynn, PT [phone] 06/24/2016

## 2016-06-24 NOTE — Progress Notes (Signed)
Patient ID: [name], female   DOB: 11/26/1975, 41 y.o.   MRN: [id number]  PROGRESS NOTE    [name]  [id number] DOB: 12/30/1975 DOA: 05/26/2016 PCP: Marletta LorBarr, Julie, NP   Brief Narrative:  41 y.o. female with a history of CVA, DM2, morbid obesity, chronic lymphedema, HTN, chronic diastolic heart failure, depression, and admission 3/21 > 4/20 for acute encephalopathy, acute renal failure on CKD 5 requiring HD, decompensated heart failure, and infected leg ulcer. She was discharged to a SNF. Upon completion of her HD session on the day of presentation she was noted to have mental status changes. She was transferred to the ED where she had a WBC count of 20, Hgb 9, Ammonia level normal. Head CT was negative for acute process. MRI/MRA noted acute right deep gray nuclei and right hippocampal infarcts with an occluded R MCA, Right ICA, and Left vertebral artery. She has remained almost unresponsive since admission   Assessment & Plan:   Principal Problem:   CVA (cerebral vascular accident) [facility](HCC) Active Problems:   Morbid obesity (HCC)   Essential hypertension   Acute metabolic encephalopathy   Uncontrolled diabetes mellitus with diabetic nephropathy, with long-term current use of insulin (HCC)   ESRD (end stage renal disease) (HCC)   Depression due to physical illness   Wound abscess  Acute metabolic encephalopathy in setting of chronic encephalopathy - During last admit mental status apparently waxed and waned - Psych saw in consultation and suggested tx for depression at that time.  - Mental status is not improving.  - Probably from acute CVAs on presentation - avoiding sedating meds - repeat head imaging w/ CT 5/8 noted no new findings - this appears to be the patient's new baseline. Her prognosis appears to be more. - We will recall palliative care  Acute multifocal CVAs with history of multiple prior CVAs Reportedly bedbound at baseline due to prior CVAs  - Neurology has evaluated and since signed off - they felt her AMS was out of proportion to her CVAs - I suspect her CVAs may in fact be the cause of her AMS  L posterior/medial thigh wound s/p debridement by [facility] 05/21/16 - completed a course of abx tx on 05/31/16 - CT of the thigh this admit noted a new area c/w an abscess not communicating with the currently opened area - Gen Surg took her to the OR 06/18/2016 - broad abx coverage continues as suggested by ID  - wound care per Gen Surgery   ?SBE - Possible 7x156mm vegetation L coronary cusp (per TTE) - ruled out via TEE Blood cx during last admit 1 of 6 + for coag neg Staph strongly suggestive of contaminant - no such mass noted on TTE 03/22/16 - TEE 07/01/2016 w/o evidence of SBE/vegetation - abx per ID for 30 days.  ESRD on HD Nephrology attending to HD M/W/F   Reactive RPR, probably false-positive  Quant f/u RPR 1:1 w/ negative T pallidum abs - ID feels this is a false +  Morbid obesity - Body mass index is 41.41 kg/m.   Nutrition  s/p PEG placement in IR 5/10 - tube feeds infusing w/o difficulty presently   HTN wish to avoid hypotension during HD due to persisting cerebrovascular disease. Currently antihypertensives ordered as needed. Blood pressures have been stable   DM CBG not well controlled at this time  Continue sliding scale insulin with long-acting insulin  Chronic diastolic congestive heart failure TTE 4/28 noted EF 55-60% w/  grade 1 DD - EDW per HD center is 105.5kg - no signif volume overload at this time - volume management per hemodialysis  Leukocytosis Currently on antibiotics as per ID; repeat am labs   DVT prophylaxis: Subcutaneous heparin Code Status:  Full Family Communication: No family present at bedside Disposition Plan: Unclear at this time  Consultants: Nephrology, general surgery, neurology, infectious diseases  Procedures: 06/20/2016 PEG tube placed by IR  Antimicrobials:  Zosyn 4/30 > 5/02  + 5/07 > Cefepime 5/02 > 5/06 Vanc 4/30 >   Subjective: Patient seen and examined at bedside. She hardly wakes up and does not answer any questions  Objective: Vitals:   06/24/16 1100 06/24/16 1130 06/24/16 1200 06/24/16 1230  BP: (!) 144/83 (!) 101/53 130/77 128/70  Pulse: (!) 117 (!) 114 (!) 116 (!) 111  Resp:      Temp:      TempSrc:      SpO2: 98% 100% 100% 100%  Height:        Intake/Output Summary (Last 24 hours) at 06/24/16 1305 Last data filed at 06/24/16 0820  Gross per 24 hour  Intake          1543.33 ml  Output               25 ml  Net          1518.33 ml   Filed Weights    Examination:  General exam: No acute distress; almost unresponsive Respiratory system: Bilateral decreased breath sound at bases with scattered crackles Cardiovascular system: S1 & S2 heard, rate controlled  Gastrointestinal system: Abdomen is distended, soft and nontender. PEG tube present Extremities: No cyanosis, clubbing, large wound in the left thigh with wound VAC in place    Data Reviewed: I have personally reviewed following labs and imaging studies  CBC:  Recent Labs Lab 06/19/16 0845 06/20/16 0314 06/21/16 1330 06/23/16 1229 06/24/16 0224  WBC 13.1* 14.8* 16.3* 13.8* 16.0*  HGB 7.7* 8.8* 7.1* 7.2* 8.3*  HCT 26.1* 29.5* 24.0* 23.6* 27.2*  MCV 89.1 89.9 88.6 90.4 92.2  PLT 368 343 422* 392 375   Basic Metabolic Panel:  Recent Labs Lab 06/19/16 0845 06/21/16 1330 06/23/16 1229 06/24/16 0224  NA 135 136 131* 135  K 3.3* 3.9 4.0 3.8  CL 97* 98* 94* 95*  CO2 27 21* 23 21*  GLUCOSE 157* 162* 433* 230*  BUN 54* 64* 62* 84*  CREATININE 2.44* 3.69* 3.34* 3.65*  CALCIUM 8.7* 9.1 9.0 9.2  PHOS 2.5 5.3* 4.4  --    GFR: Estimated Creatinine Clearance: 23.2 mL/min (A) (by C-G formula based on SCr of 3.65 mg/dL (H)). Liver Function Tests:  Recent Labs Lab 06/19/16 0845 06/21/16 1330 06/23/16 1229  ALBUMIN 1.4* 1.3* 1.4*   No results for input(s): LIPASE,  AMYLASE in the last 168 hours. No results for input(s): AMMONIA in the last 168 hours. Coagulation Profile:  Recent Labs Lab 06/20/16 0314  INR 1.34   Cardiac Enzymes: No results for input(s): CKTOTAL, CKMB, CKMBINDEX, TROPONINI in the last 168 hours. BNP (last 3 results) No results for input(s): PROBNP in the last 8760 hours. HbA1C: No results for input(s): HGBA1C in the last 72 hours. CBG:  Recent Labs Lab 06/23/16 1600 06/23/16 1926 06/23/16 2329 06/24/16 0325 06/24/16 0822  GLUCAP 379* 305* 248* 224* 214*   Lipid Profile: No results for input(s): CHOL, HDL, LDLCALC, TRIG, CHOLHDL, LDLDIRECT in the last 72 hours. Thyroid Function Tests: No results for input(s): TSH,  T4TOTAL, FREET4, T3FREE, THYROIDAB in the last 72 hours. Anemia Panel:  Recent Labs  06/24/16 0224  FERRITIN 4,098*  TIBC 108*  IRON 29   Sepsis Labs: No results for input(s): PROCALCITON, LATICACIDVEN in the last 168 hours.  No results found for this or any previous visit (from the past 240 hour(s)).       Radiology Studies: No results found.      Scheduled Meds: . aspirin  325 mg Per Tube Daily  . atorvastatin  20 mg Per Tube QPM  . chlorhexidine  15 mL Mouth Rinse BID  . [START ON 06/26/2016] darbepoetin (ARANESP) injection - DIALYSIS  300 mcg Intravenous Q Wed-HD  . feeding supplement (NEPRO CARB STEADY)  1,000 mL Oral Q24H  . feeding supplement (PRO-STAT SUGAR FREE 64)  30 mL Per Tube TID  . FLUoxetine  20 mg Per Tube Daily  . heparin subcutaneous  5,000 Units Subcutaneous Q8H  . insulin aspart  0-20 Units Subcutaneous Q4H  . insulin glargine  35 Units Subcutaneous BID  . sucroferric oxyhydroxide  500 mg Oral TID WC  . Zinc Oxide   Topical BID   Continuous Infusions: . sodium chloride    . sodium chloride    . piperacillin-tazobactam (ZOSYN)  IV Stopped (06/24/16 0207)  . sodium chloride    . vancomycin 1,000 mg (06/24/16 1158)     LOS: 16 days        Glade Lloyd, MD [facility] Pager [phone]  If 7PM-7AM, please contact night-coverage [email or web address] Password [email or web address] 06/24/2016, 1:05 PM

## 2016-06-24 NOTE — Progress Notes (Signed)
CSW continues to follow for discharge needs. [facility] CSW remains available for support and discharge needs.  Marrianne MoodAshley Brindley Madarang, MSW,  [employer]WA [phone]

## 2016-06-24 NOTE — Progress Notes (Signed)
Pharmacy Antibiotic Note [name] is a 41 y.o. female admitted on 06/09/2016 with sepsis. D15 of abx for possible sepsis; found with possible vegetation on L coronary cusp on TTE, TEE showed no vegetation. LLE thigh wound - s/p I&D 4/10 and repeat 5/1. Afeb, WBC 13.1 (trending down).  Vancomycin random level this morning at 23 is within desired preHD range of 15 -25. Plans to continue with MWF IHD.   Plan: 1. Continue vancomycin 1g IV QHD-MWF 2. Remains on Zosyn 3.375 gm IV q12h (4 hour infusion) 3. Weekly preHD levels unless clinical course dictates otherwise  4. Per ID's note, total length of therapy for 30 days   Height: 5\' 2"  (157.5 cm) Weight:  (bed scale not working) IBW/kg (Calculated) : 50.1  Temp (24hrs), Avg:98.4 F (36.9 C), Min:98.2 F (36.8 C), Max:98.7 F (37.1 C)   Recent Labs Lab 06/19/16 0845 06/20/16 0314 06/21/16 1330 06/23/16 1229 06/24/16 0224  WBC 13.1* 14.8* 16.3* 13.8* 16.0*  CREATININE 2.44*  --  3.69* 3.34* 3.65*  VANCORANDOM 29  --   --   --  23    Estimated Creatinine Clearance: 23.2 mL/min (A) (by C-G formula based on SCr of 3.65 mg/dL (H)).    Allergies  Allergen Reactions  . Azithromycin Other (See Comments)    Nose bleeding event   Antibiotics since admission:  Vancomycin 4/30 >> (5/28)  Zosyn 4/30>> 5/2; 5/7 >> (5/28) Cefepime 5/2 >> 5/7   Pertinent levels:  5/14 vancomycin preHD level 23 5/9 vancomycin pre HD level 29   Culture data:  4/10 wound culture: mult orgs present, abundant bacteroides ovatus beta lactamase + RPR Reactive: T Pallidum Abx neg, likely false positive RPR 4/30 BCx: ngtd 5/1 LLE abscess: few GNR> mult spec present  Pollyann SamplesAndy Jennet Scroggin, PharmD, BCPS 06/24/2016, 8:52 AM

## 2016-06-25 ENCOUNTER — Inpatient Hospital Stay (HOSPITAL_COMMUNITY): Payer: Medicaid Other

## 2016-06-25 LAB — BASIC METABOLIC PANEL
Anion gap: 12 (ref 5–15)
BUN: 40 mg/dL — AB (ref 6–20)
CHLORIDE: 94 mmol/L — AB (ref 101–111)
CO2: 28 mmol/L (ref 22–32)
CREATININE: 1.97 mg/dL — AB (ref 0.44–1.00)
Calcium: 8.5 mg/dL — ABNORMAL LOW (ref 8.9–10.3)
GFR calc Af Amer: 36 mL/min — ABNORMAL LOW (ref >60)
GFR, EST NON AFRICAN AMERICAN: 31 mL/min — AB (ref >60)
GLUCOSE: 173 mg/dL — AB (ref 65–99)
Potassium: 3.9 mmol/L (ref 3.5–5.1)
Sodium: 134 mmol/L — ABNORMAL LOW (ref 135–145)

## 2016-06-25 LAB — GLUCOSE, CAPILLARY
GLUCOSE-CAPILLARY: 170 mg/dL — AB (ref 65–99)
GLUCOSE-CAPILLARY: 179 mg/dL — AB (ref 65–99)
GLUCOSE-CAPILLARY: 185 mg/dL — AB (ref 65–99)
Glucose-Capillary: 171 mg/dL — ABNORMAL HIGH (ref 65–99)
Glucose-Capillary: 199 mg/dL — ABNORMAL HIGH (ref 65–99)
Glucose-Capillary: 160 mg/dL — ABNORMAL HIGH (ref 65–99)
Glucose-Capillary: 121 mg/dL — ABNORMAL HIGH (ref 65–99)
Glucose-Capillary: 113 mg/dL — ABNORMAL HIGH (ref 65–99)

## 2016-06-25 LAB — CBC WITH DIFFERENTIAL/PLATELET
Basophils Absolute: 0 10*3/uL (ref 0.0–0.1)
Basophils Relative: 0 %
EOS ABS: 0.4 10*3/uL (ref 0.0–0.7)
EOS PCT: 3 %
HCT: 23 % — ABNORMAL LOW (ref 36.0–46.0)
Hemoglobin: 6.8 g/dL — CL (ref 12.0–15.0)
LYMPHS ABS: 3.2 10*3/uL (ref 0.7–4.0)
LYMPHS PCT: 19 %
MCH: 27.1 pg (ref 26.0–34.0)
MCHC: 29.6 g/dL — AB (ref 30.0–36.0)
MCV: 91.6 fL (ref 78.0–100.0)
MONOS PCT: 6 %
Monocytes Absolute: 1 10*3/uL (ref 0.1–1.0)
Neutro Abs: 11.6 10*3/uL — ABNORMAL HIGH (ref 1.7–7.7)
Neutrophils Relative %: 71 %
PLATELETS: 393 10*3/uL (ref 150–400)
RBC: 2.51 MIL/uL — ABNORMAL LOW (ref 3.87–5.11)
RDW: 19.8 % — ABNORMAL HIGH (ref 11.5–15.5)
WBC: 16.3 10*3/uL — ABNORMAL HIGH (ref 4.0–10.5)

## 2016-06-25 LAB — MRSA PCR SCREENING: MRSA BY PCR: NEGATIVE

## 2016-06-25 LAB — PREPARE RBC (CROSSMATCH)

## 2016-06-25 MED ORDER — DARBEPOETIN ALFA 200 MCG/0.4ML IJ SOSY
200.0000 ug | PREFILLED_SYRINGE | INTRAMUSCULAR | Status: DC
  Administered 2016-06-26 – 2016-07-10 (×3): 200 ug via INTRAVENOUS
  Filled 2016-06-25 (×4): qty 0.4

## 2016-06-25 MED ORDER — METOPROLOL TARTRATE 25 MG PO TABS
25.0000 mg | ORAL_TABLET | Freq: Two times a day (BID) | ORAL | Status: DC
  Administered 2016-06-25 – 2016-07-15 (×32): 25 mg
  Filled 2016-06-25 (×35): qty 1

## 2016-06-25 MED ORDER — SODIUM CHLORIDE 0.9 % IV SOLN
Freq: Once | INTRAVENOUS | Status: AC
  Administered 2016-06-25: 05:00:00 via INTRAVENOUS

## 2016-06-25 NOTE — Progress Notes (Signed)
Patient ID: [name], female   DOB: Feb 29, 1975, 41 y.o.   MRN: [id number]  PROGRESS NOTE    [name]  WUJ:[id number] DOB: 07/22/1975 DOA: 05/15/2016 PCP: Linda Lor, NP   Brief Narrative:  41 y.o. 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 She was discharged to a SNF. Upon completion of her HD session on the day of presentation she was noted to have mental status changes. She was transferred to the ED where she had a WBC count of 20, Hgb 9, Ammonia level normal. Head CT was negative for acute process. MRI/MRA noted acute right deep gray nuclei and right hippocampal infarcts with an occluded R MCA, Right ICA, and Left vertebral artery. She has remained almost unresponsive since admission   Assessment & Plan:   Principal Problem:   CVA (cerebral vascular accident) [facility]) Active Problems:   Morbid obesity (HCC)   Essential hypertension   Acute metabolic encephalopathy   Uncontrolled diabetes mellitus with diabetic nephropathy, with long-term current use of insulin (HCC)   ESRD (end stage renal disease) (HCC)   Depression due to physical illness   Wound abscess   Palliative care encounter  Acute metabolic encephalopathy in setting of chronic encephalopathy - During last admit mental status apparently waxed and waned - Psych saw in consultation and suggested tx for depression at that time.  - Mental status is not improving.  - Probably from acute CVAs on presentation - avoiding sedating meds - repeat head imaging w/ CT 5/8 noted no new findings - this appears to be the patient's new baseline. Her prognosis appears to be more. - Palliative care follow-up is appreciated: Patient will remain full code for now.   Acute multifocal CVAs with  history of multiple prior CVAs Reportedly bedbound at baseline due to prior CVAs - Neurology has evaluated and since signed off - they felt her AMS was out of proportion to her CVAs - I suspect her CVAs may in fact be the cause of her AMS  L posterior/medial thigh wound s/p debridement by Len Childs 05/21/16 - completed a course of abx tx on 05/31/16 - CT of the thigh this admit noted a new area c/w an abscess not communicating with the currently opened area - Gen Surg took her to the OR 06/23/2016 - broad abx coverage continues as suggested by ID  - wound care per Gen Surgery   ?SBE - Possible 7x61mm vegetation L coronary cusp (per TTE) - ruled out via TEE Blood cx during last admit 1 of 6 + for coag neg Staph strongly suggestive of contaminant - no such mass noted on TTE 03/22/16 - TEE 07/11/2016 w/o evidence of SBE/vegetation - abx per ID for 30 days. Patient is still having low-grade temperatures. Repeat cultures and chest x-ray  ESRD on HD Nephrology attending to HD M/W/F   Reactive RPR, probably false-positive  Quant f/u RPR 1:1 w/ negative T pallidum abs - ID feels this is a false +  Morbid obesity- Body mass index is 41.41 kg/m.  Nutrition  s/p PEG placement in IR 5/10 - tube feeds infusing w/o difficulty presently   HTN wish to avoid hypotension during HD due to persisting cerebrovascular disease. Currently antihypertensives ordered as needed. Blood pressures have been stable  DM CBG not well controlled at this time  Continue sliding scale insulin with long-acting insulin  Chronic diastolic congestive heart failure TTE 4/28 noted EF 55-60% w/ grade 1 DD - EDW per HD center is 105.5kg -  volume management per hemodialysis Add metoprolol if blood pressure allows  Leukocytosis Currently on antibiotics as per ID; repeat am labs  Tachycardia Will add metoprolol if blood pressure allows. If persistent the tachycardic with fevers, will repeat echo to evaluate for new  vegetations  Anemia from anemia of chronic disease from renal failure: The patient hemoglobin in a.m. If hemoglobin remains less than 7, then  patient might need transfusion with hemodialysis  DVT prophylaxis: Subcutaneous heparin Code Status:  Full Family Communication: No family present at bedside Disposition Plan: Unclear at this time  Consultants: Nephrology, general surgery, neurology, infectious diseases  Procedures: 06/20/2016 PEG tube placed by IR  Antimicrobials:  Zosyn 4/30 > 5/02 + 5/07 > Cefepime 5/02 > 5/06 Vanc 4/30 >    Subjective: Patient seen and examined at bedside. She hardly wakes up and does not answer any questions. Objective: Vitals:   06/25/16 0656 06/25/16 0700 06/25/16 1000 06/25/16 1100  BP: 131/69 131/69 126/72   Pulse: (!) 116 70 (!) 121 (!) 115  Resp: (!) 28 (!) 26 (!) 25 (!) 26  Temp: 100.1 F (37.8 C) 100.2 F (37.9 C)  99.9 F (37.7 C)  TempSrc: Axillary Axillary  Axillary  SpO2: 91% 95% 98% 99%  Height:        Intake/Output Summary (Last 24 hours) at 06/25/16 1328 Last data filed at 06/25/16 1300  Gross per 24 hour  Intake          1761.66 ml  Output              700 ml  Net          1061.66 ml   Filed Weights    Examination:  General exam: No acute distress; almost unresponsive Respiratory system: Bilateral decreased breath sound at bases with scattered crackles Cardiovascular system: S1 & S2 heard, tachycardic  Gastrointestinal system: Abdomen is distended, soft and nontender. PEG tube present  Central nervous system: Almost Unresponsive Extremities: No cyanosis, clubbing, 1-2+ edema  Skin: Large wound of the left thigh with wound VAC in place Psychiatry: Unable to evaluate    Data Reviewed: I have personally reviewed following labs and imaging studies  CBC:  Recent Labs Lab 06/20/16 0314 06/21/16 1330 06/23/16 1229 06/24/16 0224 06/25/16 0218  WBC 14.8* 16.3* 13.8* 16.0* 16.3*  NEUTROABS  --   --   --    --  11.6*  HGB 8.8* 7.1* 7.2* 8.3* 6.8*  HCT 29.5* 24.0* 23.6* 27.2* 23.0*  MCV 89.9 88.6 90.4 92.2 91.6  PLT 343 422* 392 375 393   Basic Metabolic Panel:  Recent Labs Lab 06/19/16 0845 06/21/16 1330 06/23/16 1229 06/24/16 0224 06/25/16 0218  NA 135 136 131* 135 134*  K 3.3* 3.9 4.0 3.8 3.9  CL 97* 98* 94* 95* 94*  CO2 27 21* 23 21* 28  GLUCOSE 157* 162* 433* 230* 173*  BUN 54* 64* 62* 84* 40*  CREATININE 2.44* 3.69* 3.34* 3.65* 1.97*  CALCIUM 8.7* 9.1 9.0 9.2 8.5*  PHOS 2.5 5.3* 4.4  --   --    GFR: Estimated Creatinine Clearance: 43 mL/min (A) (by C-G formula based on SCr of 1.97 mg/dL (H)). Liver Function Tests:  Recent Labs Lab 06/19/16 0845 06/21/16 1330 06/23/16 1229  ALBUMIN 1.4* 1.3* 1.4*   No results for input(s):  LIPASE, AMYLASE in the last 168 hours. No results for input(s): AMMONIA in the last 168 hours. Coagulation Profile:  Recent Labs Lab 06/20/16 0314  INR 1.34   Cardiac Enzymes: No results for input(s): CKTOTAL, CKMB, CKMBINDEX, TROPONINI in the last 168 hours. BNP (last 3 results) No results for input(s): PROBNP in the last 8760 hours. HbA1C: No results for input(s): HGBA1C in the last 72 hours. CBG:  Recent Labs Lab 06/24/16 1600 06/24/16 1929 06/24/16 2312 06/25/16 0404 06/25/16 0811  GLUCAP 147* 171* 170* 179* 185*   Lipid Profile: No results for input(s): CHOL, HDL, LDLCALC, TRIG, CHOLHDL, LDLDIRECT in the last 72 hours. Thyroid Function Tests: No results for input(s): TSH, T4TOTAL, FREET4, T3FREE, THYROIDAB in the last 72 hours. Anemia Panel:  Recent Labs  06/24/16 0224  FERRITIN 4,098*  TIBC 108*  IRON 29   Sepsis Labs: No results for input(s): PROCALCITON, LATICACIDVEN in the last 168 hours.  Recent Results (from the past 240 hour(s))  MRSA PCR Screening     Status: None   Collection Time: 06/25/16  2:26 AM  Result Value Ref Range Status   MRSA by PCR NEGATIVE NEGATIVE Final    Comment:        The GeneXpert  MRSA Assay (FDA approved for NASAL specimens only), is one component of a comprehensive MRSA colonization surveillance program. It is not intended to diagnose MRSA infection nor to guide or monitor treatment for MRSA infections.          Radiology Studies: No results found.      Scheduled Meds: . aspirin  325 mg Per Tube Daily  . atorvastatin  20 mg Per Tube QPM  . chlorhexidine  15 mL Mouth Rinse BID  . [START ON 06/26/2016] darbepoetin (ARANESP) injection - DIALYSIS  200 mcg Intravenous Q Wed-HD  . feeding supplement (NEPRO CARB STEADY)  1,000 mL Oral Q24H  . feeding supplement (PRO-STAT SUGAR FREE 64)  30 mL Per Tube TID  . FLUoxetine  20 mg Per Tube Daily  . heparin subcutaneous  5,000 Units Subcutaneous Q8H  . insulin aspart  0-20 Units Subcutaneous Q4H  . insulin glargine  35 Units Subcutaneous BID  . sucroferric oxyhydroxide  500 mg Oral TID WC  . Zinc Oxide   Topical BID   Continuous Infusions: . sodium chloride    . sodium chloride    . piperacillin-tazobactam (ZOSYN)  IV 3.375 g (06/25/16 1236)  . sodium chloride    . vancomycin Stopped (06/24/16 1258)     LOS: 17 days        Glade Lloyd, MD [facility] Pager [phone]  If 7PM-7AM, please contact night-coverage [email or web address] Password [email or web address] 06/25/2016, 1:28 PM

## 2016-06-25 NOTE — Progress Notes (Addendum)
CRITICAL VALUE ALERT  Critical value received:  Hgb 6.8  Date of notification:  06/25/16  Time of notification:  0309  Critical value read back:Yes.    Nurse who received alert:  Balinda QuailsJessica Miller, RN  MD notified (1st page):  Kirtland BouchardK. Schorr  Time of first page:  0310  Responding MD:  Merdis DelayK. Schorr  Time MD responded:  [phone]

## 2016-06-25 NOTE — Progress Notes (Signed)
All wounds cleansed, flexiseal remains in place, butt past applied as ordered to wounds and replaced foam dressings to all areas.

## 2016-06-25 NOTE — Progress Notes (Signed)
Occupational Therapy Treatment Patient Details Name: [name] MRN: [id number] DOB: 1975-12-25 Today's Date: 06/25/2016    History of present illness Patient is a 41 yo female admitted 05/18/2016 with AMS from [facility].  Patient with Rt infarcts, occluded MCA, Rt ICA, and Lt vertebral artery. No verbalizations or voluntary movement.   PMH:  CVA with RLE weak, DM, obesity, chronic lymphedema, wound Rt calf, VAC on Lt thigh, HTN, CHF, depression, ESRD on HD, asthma   OT comments  This 41 yo female admitted with above showed progress today with eye opening (70-75%) of session and followed 1 command with increased time (move your right arm from your lap as I tapped on it). She also tolerated 15 minutes at EOB. She will continue to benefit from acute OT to see if further progress can be made.   Follow Up Recommendations  SNF;Supervision/Assistance - 24 hour    Equipment Recommendations  Other (comment) (TBD at next venue)       Precautions / Restrictions Precautions Precautions: Fall Precaution Comments: wound vac to L inner thigh; flexiseal Restrictions Weight Bearing Restrictions: No       Mobility Bed Mobility Overal bed mobility: Needs Assistance Bed Mobility: Supine to Sit;Sit to Supine;Rolling Rolling: Total assist;+2 for physical assistance   Supine to sit: Total assist;+2 for physical assistance Sit to supine: Total assist;+2 for physical assistance   General bed mobility comments: assist for moving up to EOB; pt not assisting; positioned for comfort, head in midline, turned to R and HOB @ 30 degrees  Transfers                      Balance Overall balance assessment: Needs assistance Sitting-balance support: Feet supported;Bilateral upper extremity supported Sitting balance-Leahy Scale: Zero Sitting balance - Comments: sat approx 15 minutes at EOB working on visual tracking, eliciting movement of R UE in response to command or to move it if  wanting to lie down, HR max 124, BP 126/72                                   ADL either performed or assessed with clinical judgement   ADL Overall ADL's : Needs assistance/impaired                                       General ADL Comments: Pt remains total A for all basic ADLs     Vision   Additional Comments: Pt maintained eye opening 70-75% of session; no blink to threat, minimal pupil restriction on left with light; right pupil enlarged over right and no restriction response to light. When moving pt's head left to right both eyes go in opposite direction          Cognition Arousal/Alertness: Awake/alert Behavior During Therapy: Flat affect Overall Cognitive Status: Impaired/Different from baseline Area of Impairment: Following commands                       Following Commands: Follows one step commands inconsistently                      General Comments continues with L inner thigh wound with vac    Pertinent Vitals/ Pain       Pain Assessment: Faces Faces Pain Scale: Hurts  little more Pain Location: generalized with movement Pain Descriptors / Indicators: Moaning Pain Intervention(s): Monitored during session;Repositioned         Frequency  Min 2X/week        Progress Toward Goals  OT Goals(current goals can now be found in the care plan section)  Progress towards OT goals: Progressing toward goals  Acute Rehab OT Goals Patient Stated Goal: none stated  Plan Discharge plan remains appropriate    Co-evaluation    PT/OT/SLP Co-Evaluation/Treatment: Yes Reason for Co-Treatment: Complexity of the patient's impairments (multi-system involvement);For patient/therapist safety;To address functional/ADL transfers PT goals addressed during session: Mobility/safety with mobility;Balance OT goals addressed during session: Strengthening/ROM      AM-PAC PT "6 Clicks" Daily Activity     Outcome Measure   Help  from another person eating meals?: Total Help from another person taking care of personal grooming?: Total Help from another person toileting, which includes using toliet, bedpan, or urinal?: Total Help from another person bathing (including washing, rinsing, drying)?: Total Help from another person to put on and taking off regular upper body clothing?: Total Help from another person to put on and taking off regular lower body clothing?: Total 6 Click Score: 6    End of Session    OT Visit Diagnosis: Muscle weakness (generalized) (M62.81);Low vision, both eyes (H54.2);Other symptoms and signs involving the nervous system (R29.898);Other symptoms and signs involving cognitive function;Cognitive communication deficit (R41.841);Hemiplegia and hemiparesis Symptoms and signs involving cognitive functions: Cerebral infarction Hemiplegia - Right/Left:  (both) Hemiplegia - dominant/non-dominant:  (both) Hemiplegia - caused by: Cerebral infarction   Activity Tolerance Patient limited by lethargy   Patient Left in bed   Nurse Communication Mobility status        Time: [id number] OT Time Calculation (min): 44 min  Charges: OT General Charges $OT Visit: 1 Procedure OT Treatments $Therapeutic Activity: 8-22 mins  Ignacia PalmaCathy Pattricia Weiher, OTR/L [phone] 06/25/2016

## 2016-06-25 NOTE — Progress Notes (Signed)
[name] Progress Note   Subjective: no verbal output.  PT has her sitting on side of bed 1st time, pt didn't assist any and is not following commands per PT  Vitals:   06/25/16 0448 06/25/16 0515 06/25/16 0656 06/25/16 0700  BP: 132/83 (!) 104/53 131/69 131/69  Pulse: (!) 116 (!) 113 (!) 116 70  Resp: (!) 30 (!) 26 (!) 28 (!) 26  Temp: 99.7 F (37.6 C) 99.5 F (37.5 C) 100.1 F (37.8 C) 100.2 F (37.9 C)  TempSrc: Axillary Axillary Axillary Axillary  SpO2: 97% 91% 91% 95%  Height:        Inpatient medications: . aspirin  325 mg Per Tube Daily  . atorvastatin  20 mg Per Tube QPM  . chlorhexidine  15 mL Mouth Rinse BID  . [START ON 06/26/2016] darbepoetin (ARANESP) injection - DIALYSIS  200 mcg Intravenous Q Wed-HD  . feeding supplement (NEPRO CARB STEADY)  1,000 mL Oral Q24H  . feeding supplement (PRO-STAT SUGAR FREE 64)  30 mL Per Tube TID  . FLUoxetine  20 mg Per Tube Daily  . heparin subcutaneous  5,000 Units Subcutaneous Q8H  . insulin aspart  0-20 Units Subcutaneous Q4H  . insulin glargine  35 Units Subcutaneous BID  . sucroferric oxyhydroxide  500 mg Oral TID WC  . Zinc Oxide   Topical BID   . sodium chloride    . sodium chloride    . piperacillin-tazobactam (ZOSYN)  IV Stopped (06/25/16 0111)  . sodium chloride    . vancomycin Stopped (06/24/16 1258)   sodium chloride, sodium chloride, [DISCONTINUED] acetaminophen **OR** [DISCONTINUED] acetaminophen (TYLENOL) oral liquid 160 mg/5 mL **OR** acetaminophen, acetaminophen, alteplase, Gerhardt's butt cream, glucagon (human recombinant), heparin, hydrALAZINE, HYDROcodone-acetaminophen, HYDROmorphone (DILAUDID) injection, lidocaine (PF), lidocaine-prilocaine, metoprolol tartrate, ondansetron (ZOFRAN) IV, pentafluoroprop-tetrafluoroeth  Exam: Eyes open, not following commands Chest CTAB RRR no rmg Abd obese soft ntnd Ext no LE edema, wound VAC L thigh LUA AVG +bruit   Dialysis: [facility]  4.5h    400/800 105.5kg 3K/2Ca TDC R IJ/ maturing LUA AVG Hep 4400 - Mircera IV q 2 weeks (last given 4/25) - Calcitriol 0.56mcg PO q HD      Assessment: 1  Acute CVA - chronic R intracranial occlusion and L vertebral occlusion 2  AMS remains poorly repsonsive 3  ESRD MWF hd - keep SBP > 110 4  L thigh soft-tissue abscess, sp I&D x 2 5  ID possible veg on TTE, not seen on TEE; plan 4 wks empiric abx per ID 6  HTN home coreg on hold, using prn IV meds 7  Anemia of CKD - max darbe 300 / wk, no IV Fe given infection; Hb down, may be prbc's with HD tomorrow 8  MBD - low Ca bath and no vit D as serum Ca running high here. nonCa+ binders.   Plan - HD tomorrow   Vinson Moselle MD [facility] pager [phone]   06/25/2016, 12:31 PM    Recent Labs Lab 06/19/16 0845 06/21/16 1330 06/23/16 1229 06/24/16 0224 06/25/16 0218  NA 135 136 131* 135 134*  K 3.3* 3.9 4.0 3.8 3.9  CL 97* 98* 94* 95* 94*  CO2 27 21* 23 21* 28  GLUCOSE 157* 162* 433* 230* 173*  BUN 54* 64* 62* 84* 40*  CREATININE 2.44* 3.69* 3.34* 3.65* 1.97*  CALCIUM 8.7* 9.1 9.0 9.2 8.5*  PHOS 2.5 5.3* 4.4  --   --     Recent Labs  Lab 06/19/16 0845 06/21/16 1330 06/23/16 1229  ALBUMIN 1.4* 1.3* 1.4*    Recent Labs Lab 06/23/16 1229 06/24/16 0224 06/25/16 0218  WBC 13.8* 16.0* 16.3*  NEUTROABS  --   --  11.6*  HGB 7.2* 8.3* 6.8*  HCT 23.6* 27.2* 23.0*  MCV 90.4 92.2 91.6  PLT 392 375 393   Iron/TIBC/Ferritin/ %Sat    Component Value Date/Time   IRON 29 06/24/2016 0224   TIBC 108 (L) 06/24/2016 0224   FERRITIN 4,098 (H) 06/24/2016 0224   IRONPCTSAT 27 06/24/2016 0224

## 2016-06-25 NOTE — Progress Notes (Signed)
Physical Therapy Treatment Patient Details Name: Linda DykesDavenia McKinnon [name] MRN: [id number] DOB: 09/15/1975 Today's Date: 06/25/2016    History of Present Illness Patient is a 41 yo female admitted 05/24/2016 with AMS from Dialysis Center.  Patient with Rt infarcts, occluded MCA, Rt ICA, and Lt vertebral artery. No verbalizations or voluntary movement.   PMH:  CVA with RLE weak, DM, obesity, chronic lymphedema, wound Rt calf, VAC on Lt thigh, HTN, CHF, depression, ESRD on HD, asthma    PT Comments    Patient able to respond to command about 10% with increased time and tactile and verbal cues.  She also maintained her eyes open about 70% of session.  Patient continues to be appropriate for skilled PT in the acute setting and for SNF rehab at d/c.  Goals updated this session as well.    Follow Up Recommendations  SNF;Supervision/Assistance - 24 hour     Equipment Recommendations       Recommendations for Other Services       Precautions / Restrictions Precautions Precautions: Fall Precaution Comments: wound vac to L inner thigh; flexiseal    Mobility  Bed Mobility Overal bed mobility: Needs Assistance Bed Mobility: Supine to Sit;Sit to Supine;Rolling Rolling: Total assist;+2 for physical assistance   Supine to sit: Total assist;+2 for physical assistance Sit to supine: Total assist;+2 for physical assistance   General bed mobility comments: assist for moving up to EOB; pt not assisting; positioned for comfort, head in midline, turned to R and HOB @ 30 degrees  Transfers                    Ambulation/Gait                 Stairs            Wheelchair Mobility    Modified Rankin (Stroke Patients Only) Modified Rankin (Stroke Patients Only) Pre-Morbid Rankin Score: Moderate disability Modified Rankin: Severe disability     Balance Overall balance assessment: Needs assistance Sitting-balance support: Feet supported;Bilateral upper extremity  supported Sitting balance-Leahy Scale: Zero Sitting balance - Comments: sat approx 15 minutes at EOB working on visual tracking, eliciting movement of R UE in response to command or to move it if wanting to lie down, HR max 124, BP 126/72                                    Cognition Arousal/Alertness: Awake/alert Behavior During Therapy: Flat affect Overall Cognitive Status: Impaired/Different from baseline Area of Impairment: Following commands                       Following Commands: Follows one step commands inconsistently              Exercises      General Comments General comments (skin integrity, edema, etc.): continues with L inner thigh wound with vac      Pertinent Vitals/Pain Pain Assessment: Faces Faces Pain Scale: Hurts little more Pain Location: generalized with movement Pain Descriptors / Indicators: Moaning Pain Intervention(s): Monitored during session;Repositioned    Home Living                      Prior Function            PT Goals (current goals can now be found in the care plan section) Acute Rehab PT Goals Patient  Stated Goal: none stated PT Goal Formulation: Patient unable to participate in goal setting Time For Goal Achievement: 07/09/16 Potential to Achieve Goals: Poor Progress towards PT goals: Progressing toward goals    Frequency    Min 2X/week      PT Plan Current plan remains appropriate    Co-evaluation PT/OT/SLP Co-Evaluation/Treatment: Yes Reason for Co-Treatment: Complexity of the patient's impairments (multi-system involvement);For patient/therapist safety;To address functional/ADL transfers PT goals addressed during session: Mobility/safety with mobility;Balance        AM-PAC PT "6 Clicks" Daily Activity  Outcome Measure  Difficulty turning over in bed (including adjusting bedclothes, sheets and blankets)?: Total Difficulty moving from lying on back to sitting on the side of the  bed? : Total Difficulty sitting down on and standing up from a chair with arms (e.g., wheelchair, bedside commode, etc,.)?: Total Help needed moving to and from a bed to chair (including a wheelchair)?: Total Help needed walking in hospital room?: Total Help needed climbing 3-5 steps with a railing? : Total 6 Click Score: 6    End of Session   Activity Tolerance: Patient limited by lethargy Patient left: in bed;with call bell/phone within reach   PT Visit Diagnosis: Other symptoms and signs involving the nervous system (R29.898);Muscle weakness (generalized) (M62.81)     Time: 1610-9604 PT Time Calculation (min) (ACUTE ONLY): 44 min  Charges:  $Therapeutic Activity: 23-37 mins                    G CodesSheran Lawless, [place] [phone] 06/25/2016    Elray Mcgregor 06/25/2016, 10:56 AM

## 2016-06-25 NOTE — Clinical Social Work Note (Addendum)
CSW left voicemail for husband. Per palliative note, family does not [facility]? During initial assessment, patient's husband had told CSW that he does want her to return there. Awaiting call back.  Charlynn CourtSarah Michiah Mudry, CSW [phone]  2:07 pm CSW has not received a return call. CSW left another voicemail for patient's husband.  Charlynn CourtSarah Vennie Waymire, CSW [phone]  2:14 pm Received call from patient's sister. She stated that the patient's husband left town and went up [place] on Thursday and she has not heard from him since. He left his children and dog with her as well. CSW confirmed with patient's sister that he does not have a phone. Discussed not wanting [facility] to [facility]. CSW again told patient's sister that the patient's husband legally has to make this decision. Patient's sister wanted to know what would happen if he does not call or come back. CSW will discuss with Chiropodistassistant director of social work. CSW asked patient's sister to give him CSW's phone number when he does call.  Charlynn CourtSarah Kassity Woodson, CSW [phone]  3:17 pm CSW discussed with Chiropodistassistant director of social work. We still have to go by what the patient's husband told CSW about the patient returning [facility] unless he calls and states otherwise. CSW left voicemail for patient's sister for her to call back. CSW spoke with admissions coordinator at [facility] who stated that it was the patient's sister that did her paperwork when she was initially admitted there.  Charlynn CourtSarah Tanmay Halteman, CSW [phone]  3:30 pm CSW updated patient's sister who stated that it was "not fair" that her husband got to make that decision. She then put her mother on the phone and CSW asked her to have the patient's husband call me if she hears from him. The patient's mother stated that the patient's husband had said he did not want her to return but CSW emphasized that he needs to call CSW and tell that himself.  Charlynn CourtSarah  Derrick Orris, CSW [phone]  4:03 pm Received call from patient's husband. He stated that he did not want patient to [facility] and [facility] about [facility]. CSW explained that [facility] is an adult day-care and does not have the same level of care that a SNF would. Patient's husband expressed understanding. CSW offered to fax referral to other SNF's in the area. Patient's husband is agreeable. He asked that CSW reach out to the patient's sister with bed offers and she will call him. Patient's husband still wants to make the decision. Patient's husband stated that he will be back in town either this week or next week. CSW left voicemail for patient's sister. [facility] notified.  Charlynn CourtSarah Breanda Greenlaw, CSW [phone]

## 2016-06-26 DIAGNOSIS — R1310 Dysphagia, unspecified: Secondary | ICD-10-CM

## 2016-06-26 DIAGNOSIS — F0631 Mood disorder due to known physiological condition with depressive features: Secondary | ICD-10-CM

## 2016-06-26 LAB — COMPREHENSIVE METABOLIC PANEL
ALT: 21 U/L (ref 14–54)
ANION GAP: 15 (ref 5–15)
AST: 17 U/L (ref 15–41)
Albumin: 1.4 g/dL — ABNORMAL LOW (ref 3.5–5.0)
Alkaline Phosphatase: 152 U/L — ABNORMAL HIGH (ref 38–126)
BUN: 73 mg/dL — ABNORMAL HIGH (ref 6–20)
CALCIUM: 9.1 mg/dL (ref 8.9–10.3)
CHLORIDE: 95 mmol/L — AB (ref 101–111)
CO2: 25 mmol/L (ref 22–32)
CREATININE: 2.83 mg/dL — AB (ref 0.44–1.00)
GFR calc Af Amer: 23 mL/min — ABNORMAL LOW (ref >60)
GFR, EST NON AFRICAN AMERICAN: 20 mL/min — AB (ref >60)
Glucose, Bld: 63 mg/dL — ABNORMAL LOW (ref 65–99)
Potassium: 3.9 mmol/L (ref 3.5–5.1)
Sodium: 135 mmol/L (ref 135–145)
Total Bilirubin: 0.6 mg/dL (ref 0.3–1.2)
Total Protein: 7.6 g/dL (ref 6.5–8.1)

## 2016-06-26 LAB — CBC WITH DIFFERENTIAL/PLATELET
Basophils Absolute: 0 10*3/uL (ref 0.0–0.1)
Basophils Relative: 0 %
EOS PCT: 3 %
Eosinophils Absolute: 0.5 10*3/uL (ref 0.0–0.7)
HCT: 26.4 % — ABNORMAL LOW (ref 36.0–46.0)
Hemoglobin: 8.1 g/dL — ABNORMAL LOW (ref 12.0–15.0)
LYMPHS ABS: 3.6 10*3/uL (ref 0.7–4.0)
Lymphocytes Relative: 23 %
MCH: 27.8 pg (ref 26.0–34.0)
MCHC: 30.7 g/dL (ref 30.0–36.0)
MCV: 90.7 fL (ref 78.0–100.0)
Monocytes Absolute: 1.1 10*3/uL — ABNORMAL HIGH (ref 0.1–1.0)
Monocytes Relative: 7 %
NEUTROS PCT: 67 %
Neutro Abs: 10.3 10*3/uL — ABNORMAL HIGH (ref 1.7–7.7)
PLATELETS: 389 10*3/uL (ref 150–400)
RBC: 2.91 MIL/uL — AB (ref 3.87–5.11)
RDW: 19.2 % — ABNORMAL HIGH (ref 11.5–15.5)
WBC: 15.5 10*3/uL — ABNORMAL HIGH (ref 4.0–10.5)

## 2016-06-26 LAB — GLUCOSE, CAPILLARY
GLUCOSE-CAPILLARY: 84 mg/dL (ref 65–99)
GLUCOSE-CAPILLARY: 86 mg/dL (ref 65–99)
GLUCOSE-CAPILLARY: 98 mg/dL (ref 65–99)
GLUCOSE-CAPILLARY: 202 mg/dL — AB (ref 65–99)

## 2016-06-26 LAB — CBC
HCT: 23.6 % — ABNORMAL LOW (ref 36.0–46.0)
Hemoglobin: 7.2 g/dL — ABNORMAL LOW (ref 12.0–15.0)
MCH: 27.5 pg (ref 26.0–34.0)
MCHC: 30.5 g/dL (ref 30.0–36.0)
MCV: 90.1 fL (ref 78.0–100.0)
Platelets: 383 10*3/uL (ref 150–400)
RBC: 2.62 MIL/uL — AB (ref 3.87–5.11)
RDW: 19.1 % — ABNORMAL HIGH (ref 11.5–15.5)
WBC: 17.5 10*3/uL — ABNORMAL HIGH (ref 4.0–10.5)

## 2016-06-26 LAB — RENAL FUNCTION PANEL
ANION GAP: 14 (ref 5–15)
Albumin: 1.1 g/dL — ABNORMAL LOW (ref 3.5–5.0)
BUN: 73 mg/dL — ABNORMAL HIGH (ref 6–20)
CHLORIDE: 94 mmol/L — AB (ref 101–111)
CO2: 26 mmol/L (ref 22–32)
CREATININE: 2.83 mg/dL — AB (ref 0.44–1.00)
Calcium: 8.9 mg/dL (ref 8.9–10.3)
GFR, EST AFRICAN AMERICAN: 23 mL/min — AB (ref >60)
GFR, EST NON AFRICAN AMERICAN: 20 mL/min — AB (ref >60)
Glucose, Bld: 64 mg/dL — ABNORMAL LOW (ref 65–99)
POTASSIUM: 3.8 mmol/L (ref 3.5–5.1)
Phosphorus: 2.6 mg/dL (ref 2.5–4.6)
Sodium: 134 mmol/L — ABNORMAL LOW (ref 135–145)

## 2016-06-26 MED ORDER — ALTEPLASE 2 MG IJ SOLR: 2.0000 mg | Freq: Once | INTRAMUSCULAR | Status: DC | PRN

## 2016-06-26 MED ORDER — DARBEPOETIN ALFA 200 MCG/0.4ML IJ SOSY
PREFILLED_SYRINGE | INTRAMUSCULAR | Status: AC
  Administered 2016-06-26: 200 ug via INTRAVENOUS
  Filled 2016-06-26: qty 0.4

## 2016-06-26 MED ORDER — HEPARIN SODIUM (PORCINE) 1000 UNIT/ML DIALYSIS: 1000.0000 [IU] | INTRAMUSCULAR | Status: DC | PRN

## 2016-06-26 MED ORDER — OSMOLITE 1.5 CAL PO LIQD
1000.0000 mL | ORAL | Status: DC
  Administered 2016-06-26 – 2016-07-10 (×11): 1000 mL
  Filled 2016-06-26 (×19): qty 1000

## 2016-06-26 MED ORDER — SODIUM CHLORIDE 0.9 % IV SOLN: 100.0000 mL | INTRAVENOUS | Status: DC | PRN

## 2016-06-26 MED ORDER — VANCOMYCIN HCL IN DEXTROSE 1-5 GM/200ML-% IV SOLN
INTRAVENOUS | Status: AC
Start: 2016-06-26 — End: 2016-06-26
  Administered 2016-06-26: 1000 mg via INTRAVENOUS
  Filled 2016-06-26: qty 200

## 2016-06-26 MED ORDER — LIDOCAINE-PRILOCAINE 2.5-2.5 % EX CREA: 1.0000 "application " | TOPICAL_CREAM | CUTANEOUS | Status: DC | PRN

## 2016-06-26 MED ORDER — PENTAFLUOROPROP-TETRAFLUOROETH EX AERO: 1.0000 "application " | INHALATION_SPRAY | CUTANEOUS | Status: DC | PRN

## 2016-06-26 MED ORDER — HEPARIN SODIUM (PORCINE) 1000 UNIT/ML DIALYSIS: 4000.0000 [IU] | Freq: Once | INTRAMUSCULAR | Status: DC

## 2016-06-26 MED ORDER — LIDOCAINE HCL (PF) 1 % IJ SOLN: 5.0000 mL | INTRAMUSCULAR | Status: DC | PRN

## 2016-06-26 MED ORDER — PRO-STAT SUGAR FREE PO LIQD
30.0000 mL | Freq: Four times a day (QID) | ORAL | Status: DC
  Administered 2016-06-26 – 2016-07-12 (×57): 30 mL
  Filled 2016-06-26 (×55): qty 30

## 2016-06-26 NOTE — Progress Notes (Signed)
Nutrition Follow-up  DOCUMENTATION CODES:   Morbid obesity  INTERVENTION:   Adjust TF:   -Recommend changing TF formula to see if this improves diarrhea, electrolytes acceptable at present. Recommend Osmolite 1.5 @ goal of 50 ml/hr with Prostat QID providing 136 g of protein, 2200 kcals, 912 mL of free water. Provides 1200 mg of phosphorus, 2160 mg of potassium and 1200 mg of calcium. Continue to assess  -No recent new wt, will requested new weight  NUTRITION DIAGNOSIS:   Inadequate oral intake related to lethargy/confusion as evidenced by NPO status.  Continues but being addressed via TF  GOAL:   Patient will meet greater than or equal to 90% of their needs  MONITOR:   Diet advancement, PO intake, Weight trends, I & O's, TF tolerance  REASON FOR ASSESSMENT:   Malnutrition Screening Tool    ASSESSMENT:   41 y.o.woman (SNF resident) with PMH of CVA, DM2, morbid obesity, chronic lymphedema, HTN, chronic diastolic heart failure, depression, ESRD on HD, and infected leg ulcer. Admitted on 4/27 after her HD session with mental status changes.   5/10 20 fr. G-tube placed under fluoro Nepro TF infusing @ rate of 40 ml/hr with Prostat TID via PEG tube Pt with watery liquid stool with rectal tube in place Last HD today with net UF 0 mL Calcium corrected for hypoalbuminemia, hypercalcemic at present. Being addressed via low Ca bath, no vit D and non-calcium containing binders  Labs: potassium 3.8 (WDL), phosphorus 2.6 (Low normal for HD), albumin 1.1, corrected calcium 11.2, CBGs 86-160 Meds: ss novolog, lantus, velphoro  Diet Order:  Diet NPO time specified  Skin:  Wound (see comment) (R leg, sacrum, stg 2 buttocks&L thigh;DTI R heel;unst L heel); reviewed no changes  Last BM:  5/16 rectal tube  Height:   Ht Readings from Last 1 Encounters:  06/09/16 5\' 2"  (1.575 m)    Weight:   Wt Readings from Last 1 Encounters:  05/31/16 297 lb 9.9 oz (135 kg)    Ideal Body  Weight:  50 kg  BMI:  Body mass index is 41.41 kg/m.  Estimated Nutritional Needs:   Kcal:  2000-2300 kcals  Protein:  135-160 g  Fluid:  1000 mL plus UOP  EDUCATION NEEDS:   No education needs identified at this time  Linda StarcherCate Forrest Demuro MS, RD, LDN [phone] Pager  [phone] Weekend/On-Call Pager

## 2016-06-26 NOTE — Procedures (Signed)
  I was present at this dialysis session, have reviewed the session itself and made  appropriate changes Vinson Moselleob Rashaad Hallstrom MD [facility] pager [phone]   06/26/2016, 1:59 PM

## 2016-06-26 NOTE — Progress Notes (Signed)
PROGRESS NOTE    [name]  ZOX:[id number] DOB: [birth year] DOA: 07/05/2016 PCP: Marletta Lor, NP   Brief Narrative: [name] is a 41 y.o. female with a history of CVA, DM type 2, chronic lymphedema, HTN, chronic diastolic heart failure, depression. She presented with acute altered mental status and found to have new CVA.   Assessment & Plan:   Principal Problem:   CVA (cerebral vascular accident) [facility]) Active Problems:   Morbid obesity (HCC)   Essential hypertension   Acute metabolic encephalopathy   Uncontrolled diabetes mellitus with diabetic nephropathy, with long-term current use of insulin (HCC)   ESRD (end stage renal disease) (HCC)   Depression due to physical illness   Wound abscess   Palliative care encounter   Acute on chronic metabolic encephalopathy Per chart review, appears that this may be new baseline per previous evaluation.  Acute multifocal CVAs History of CVAs Neurology evaluated and signed off -continue aspirin  L posterior/medial thigh wound -continue Zosyn and vancomycin  ESRD on HD Dialysis MWF  Reactive RPR t pallidum abs negative.  Morbid obesity Body mass index is 41.41 kg/m  Nutrition PEG tube  Essential hypertension -continue metoprolol  Diabetes mellitus -continue Lantus and SSI  Chronic diastolic heart failure Last TTE on 4/28 with EF of 55-60% with grade 1 diastolic dysfunction. EDW of 105.5kg  Leukocytosis Secondary to abscess  Tachycardia Unsure of etiology. -EKG  Anemia of chronic disease Stable. Secondary to kidney disease   DVT prophylaxis: heparin subq Code Status: Full code Family Communication: None at bedside Disposition Plan: LTAC if available once medically stable.   Consultants:   Palliative care medicine  Neurology  Nephrology  Procedures:   Dialysis MWF  Antimicrobials:  Vancomycin  Cefepime  Zosyn    Subjective: Patient  non-verbal  Objective: Vitals:   06/26/16 1100 06/26/16 1115 06/26/16 1122 06/26/16 1409  BP: (!) 154/73 (!) 118/57 (!) 134/59 (!) 102/52  Pulse: (!) 111 (!) 104 (!) 111 (!) 101  Resp:   (!) 24 20  Temp:   98.4 F (36.9 C) 98.9 F (37.2 C)  TempSrc:   Oral Axillary  SpO2:   100% 97%  Height:        Intake/Output Summary (Last 24 hours) at 06/26/16 1731 Last data filed at 06/26/16 1122  Gross per 24 hour  Intake                0 ml  Output                0 ml  Net                0 ml   Filed Weights    Examination:  General exam: Appears calm and comfortable Respiratory system: Clear to auscultation. Respiratory effort normal. Cardiovascular system: S1 & S2 heard, elevated rate, normal rhythm. No murmurs. Gastrointestinal system: Abdomen is nondistended, soft and nontender. Normal bowel sounds heard. Central nervous system: Alert and unable to assess orientation. Extremities: No edema. No calf tenderness. Induration of left lateral thigh Skin: No cyanosis. No rashes Psychiatry: not able to assess    Data Reviewed: I have personally reviewed following labs and imaging studies  CBC:  Recent Labs Lab 06/23/16 1229 06/24/16 0224 06/25/16 0218 06/26/16 0624 06/26/16 0713  WBC 13.8* 16.0* 16.3* 15.5* 17.5*  NEUTROABS  --   --  11.6* 10.3*  --   HGB 7.2* 8.3* 6.8* 8.1* 7.2*  HCT 23.6* 27.2* 23.0* 26.4* 23.6*  MCV 90.4 92.2 91.6 90.7 90.1  PLT 392 375 393 389 383   Basic Metabolic Panel:  Recent Labs Lab 06/21/16 1330 06/23/16 1229 06/24/16 0224 06/25/16 0218 06/26/16 0624 06/26/16 0713  NA 136 131* 135 134* 135 134*  K 3.9 4.0 3.8 3.9 3.9 3.8  CL 98* 94* 95* 94* 95* 94*  CO2 21* 23 21* 28 25 26   GLUCOSE 162* 433* 230* 173* 63* 64*  BUN 64* 62* 84* 40* 73* 73*  CREATININE 3.69* 3.34* 3.65* 1.97* 2.83* 2.83*  CALCIUM 9.1 9.0 9.2 8.5* 9.1 8.9  PHOS 5.3* 4.4  --   --   --  2.6   GFR: Estimated Creatinine Clearance: 30 mL/min (A) (by C-G formula based  on SCr of 2.83 mg/dL (H)). Liver Function Tests:  Recent Labs Lab 06/21/16 1330 06/23/16 1229 06/26/16 0624 06/26/16 0713  AST  --   --  17  --   ALT  --   --  21  --   ALKPHOS  --   --  152*  --   BILITOT  --   --  0.6  --   PROT  --   --  7.6  --   ALBUMIN 1.3* 1.4* 1.4* 1.1*   No results for input(s): LIPASE, AMYLASE in the last 168 hours. No results for input(s): AMMONIA in the last 168 hours. Coagulation Profile:  Recent Labs Lab 06/20/16 0314  INR 1.34   Cardiac Enzymes: No results for input(s): CKTOTAL, CKMB, CKMBINDEX, TROPONINI in the last 168 hours. BNP (last 3 results) No results for input(s): PROBNP in the last 8760 hours. HbA1C: No results for input(s): HGBA1C in the last 72 hours. CBG:  Recent Labs Lab 06/25/16 2056 06/25/16 2318 06/26/16 0314 06/26/16 1049 06/26/16 1621  GLUCAP 121* 113* 84 86 98   Lipid Profile: No results for input(s): CHOL, HDL, LDLCALC, TRIG, CHOLHDL, LDLDIRECT in the last 72 hours. Thyroid Function Tests: No results for input(s): TSH, T4TOTAL, FREET4, T3FREE, THYROIDAB in the last 72 hours. Anemia Panel:  Recent Labs  06/24/16 0224  FERRITIN 4,098*  TIBC 108*  IRON 29   Sepsis Labs: No results for input(s): PROCALCITON, LATICACIDVEN in the last 168 hours.  Recent Results (from the past 240 hour(s))  MRSA PCR Screening     Status: None   Collection Time: 06/25/16  2:26 AM  Result Value Ref Range Status   MRSA by PCR NEGATIVE NEGATIVE Final    Comment:        The GeneXpert MRSA Assay (FDA approved for NASAL specimens only), is one component of a comprehensive MRSA colonization surveillance program. It is not intended to diagnose MRSA infection nor to guide or monitor treatment for MRSA infections.   Culture, blood (routine x 2)     Status: None (Preliminary result)   Collection Time: 06/25/16  7:02 PM  Result Value Ref Range Status   Specimen Description BLOOD RIGHT HAND  Final   Special Requests IN  PEDIATRIC BOTTLE Blood Culture adequate volume  Final   Culture NO GROWTH < 24 HOURS  Final   Report Status PENDING  Incomplete  Culture, blood (routine x 2)     Status: None (Preliminary result)   Collection Time: 06/25/16  7:40 PM  Result Value Ref Range Status   Specimen Description BLOOD RIGHT HAND  Final   Special Requests IN PEDIATRIC BOTTLE Blood Culture adequate volume  Final   Culture NO GROWTH < 24 HOURS  Final   Report Status  PENDING  Incomplete         Radiology Studies: Dg Chest Port 1 View  Result Date: 06/25/2016 CLINICAL DATA:  Persistent low-grade fever with dyspnea. EXAM: PORTABLE CHEST 1 VIEW COMPARISON:  07-02-16 FINDINGS: Interval removal of right IJ central venous catheter. Lungs are hypoinflated without focal consolidation or effusion. No pneumothorax. Cardiomediastinal silhouette and remainder of the exam is unchanged. IMPRESSION: Hypoinflation without acute cardiopulmonary disease. Electronically Signed   By: Elberta Fortis M.D.   On: 06/25/2016 15:55        Scheduled Meds: . aspirin  325 mg Per Tube Daily  . atorvastatin  20 mg Per Tube QPM  . chlorhexidine  15 mL Mouth Rinse BID  . darbepoetin (ARANESP) injection - DIALYSIS  200 mcg Intravenous Q Wed-HD  . feeding supplement (PRO-STAT SUGAR FREE 64)  30 mL Per Tube QID  . FLUoxetine  20 mg Per Tube Daily  . heparin subcutaneous  5,000 Units Subcutaneous Q8H  . insulin aspart  0-20 Units Subcutaneous Q4H  . insulin glargine  35 Units Subcutaneous BID  . metoprolol tartrate  25 mg Per Tube BID  . sucroferric oxyhydroxide  500 mg Oral TID WC  . Zinc Oxide   Topical BID   Continuous Infusions: . sodium chloride    . sodium chloride    . feeding supplement (OSMOLITE 1.5 CAL) 1,000 mL (06/26/16 1428)  . piperacillin-tazobactam (ZOSYN)  IV Stopped (06/26/16 0210)  . sodium chloride    . vancomycin 1,000 mg (06/26/16 1006)     LOS: 18 days     Jacquelin Hawking, MD [facility] 06/26/2016,  5:31 PM Pager: [phone]  If 7PM-7AM, please contact night-coverage [email or web address] Password [facility] 06/26/2016, 5:31 PM

## 2016-06-26 NOTE — Progress Notes (Signed)
Patient arrived back to floor. Will continue to monitor.

## 2016-06-26 NOTE — Clinical Social Work Note (Signed)
Patient transferred from 4E to [facility]C. Handoff information given to [facility] CSW. CSW called patient's daughter and provided only bed offer so far [facility]) to her and her mother.  This CSW signing off.   Charlynn CourtSarah Devario Bucklew, CSW [phone]

## 2016-06-26 NOTE — Care Management Note (Signed)
Case Management Note  Patient Details  Name: Linda DykesDavenia McKinnon [name] MRN: [id number] Date of Birth: 01/27/1976  Subjective/Objective:                    Action/Plan: Per previous CM note [name] does not have any Medicaid beds currently. Plan is for SNF when patient is medically ready. CM following.   Expected Discharge Date:                  Expected Discharge Plan:  Skilled Nursing Facility  In-House Referral:  Clinical Social Work  Discharge planning Services  CM Consult  Post Acute Care Choice:    Choice offered to:     DME Arranged:    DME Agency:     HH Arranged:    HH Agency:     Status of Service:  Completed, signed off  If discussed at [employer] Meetings, dates discussed:    Additional Comments:  Kermit BaloKelli F Octavious Zidek, RN 06/26/2016, 1:14 PM

## 2016-06-27 ENCOUNTER — Inpatient Hospital Stay (HOSPITAL_COMMUNITY): Payer: Medicaid Other

## 2016-06-27 LAB — CBC
HEMATOCRIT: 24 % — AB (ref 36.0–46.0)
Hemoglobin: 7.1 g/dL — ABNORMAL LOW (ref 12.0–15.0)
MCH: 27.6 pg (ref 26.0–34.0)
MCHC: 29.6 g/dL — ABNORMAL LOW (ref 30.0–36.0)
MCV: 93.4 fL (ref 78.0–100.0)
Platelets: 396 10*3/uL (ref 150–400)
RBC: 2.57 MIL/uL — AB (ref 3.87–5.11)
RDW: 19.7 % — AB (ref 11.5–15.5)
WBC: 18.9 10*3/uL — AB (ref 4.0–10.5)

## 2016-06-27 LAB — BASIC METABOLIC PANEL
ANION GAP: 10 (ref 5–15)
BUN: 37 mg/dL — ABNORMAL HIGH (ref 6–20)
CALCIUM: 8.6 mg/dL — AB (ref 8.9–10.3)
CO2: 27 mmol/L (ref 22–32)
Chloride: 97 mmol/L — ABNORMAL LOW (ref 101–111)
Creatinine, Ser: 1.95 mg/dL — ABNORMAL HIGH (ref 0.44–1.00)
GFR, EST AFRICAN AMERICAN: 36 mL/min — AB (ref >60)
GFR, EST NON AFRICAN AMERICAN: 31 mL/min — AB (ref >60)
Glucose, Bld: 167 mg/dL — ABNORMAL HIGH (ref 65–99)
POTASSIUM: 4.2 mmol/L (ref 3.5–5.1)
Sodium: 134 mmol/L — ABNORMAL LOW (ref 135–145)

## 2016-06-27 LAB — PREPARE RBC (CROSSMATCH)

## 2016-06-27 LAB — GLUCOSE, CAPILLARY
GLUCOSE-CAPILLARY: 139 mg/dL — AB (ref 65–99)
GLUCOSE-CAPILLARY: 215 mg/dL — AB (ref 65–99)
GLUCOSE-CAPILLARY: 179 mg/dL — AB (ref 65–99)
Glucose-Capillary: 179 mg/dL — ABNORMAL HIGH (ref 65–99)
Glucose-Capillary: 181 mg/dL — ABNORMAL HIGH (ref 65–99)
Glucose-Capillary: 196 mg/dL — ABNORMAL HIGH (ref 65–99)

## 2016-06-27 LAB — VANCOMYCIN, RANDOM: VANCOMYCIN RM: 23

## 2016-06-27 MED ORDER — SODIUM CHLORIDE 0.9 % IV BOLUS (SEPSIS)
500.0000 mL | Freq: Once | INTRAVENOUS | Status: AC
  Administered 2016-06-27: 500 mL via INTRAVENOUS

## 2016-06-27 MED ORDER — PIPERACILLIN-TAZOBACTAM 3.375 G IVPB
3.3750 g | Freq: Two times a day (BID) | INTRAVENOUS | Status: DC
  Administered 2016-06-28 – 2016-06-29 (×2): 3.375 g via INTRAVENOUS
  Filled 2016-06-27 (×4): qty 50

## 2016-06-27 MED ORDER — ACETAMINOPHEN 325 MG PO TABS
325.0000 mg | ORAL_TABLET | ORAL | Status: AC
  Administered 2016-06-27: 325 mg via ORAL
  Filled 2016-06-27: qty 1

## 2016-06-27 MED ORDER — SODIUM CHLORIDE 0.9 % IV SOLN: Freq: Once | INTRAVENOUS | Status: DC

## 2016-06-27 NOTE — Progress Notes (Signed)
PROGRESS NOTE    [name]  ZOX:[id number] DOB: 1975-05-07 DOA: 05/17/2016 PCP: Marletta Lor, NP   Brief Narrative: [name] is a 41 y.o. female with a history of CVA, DM type 2, chronic lymphedema, HTN, chronic diastolic heart failure, depression. She presented with acute altered mental status and found to have new CVA.   Assessment & Plan:   Principal Problem:   CVA (cerebral vascular accident) [facility]) Active Problems:   Morbid obesity (HCC)   Essential hypertension   Acute metabolic encephalopathy   Uncontrolled diabetes mellitus with diabetic nephropathy, with long-term current use of insulin (HCC)   ESRD (end stage renal disease) (HCC)   Depression due to physical illness   Wound abscess   Palliative care encounter   Acute on chronic metabolic encephalopathy Per chart review, appears that this may be new baseline per previous evaluation.  Acute multifocal CVAs History of CVAs Neurology evaluated and signed off -continue aspirin  L posterior/medial thigh wound Fevers Fevers overnight. Blood cultures have been negative. -continue Zosyn and vancomycin -left femur CT -repeat blood cultures  ESRD on HD Dialysis MWF  Reactive RPR t pallidum abs negative.  Morbid obesity Body mass index is 41.41 kg/m  Nutrition PEG tube  Essential hypertension -continue metoprolol  Diabetes mellitus -continue Lantus and SSI  Chronic diastolic heart failure Last TTE on 4/28 with EF of 55-60% with grade 1 diastolic dysfunction. EDW of 105.5kg  Leukocytosis Secondary to abscess  Tachycardia Unsure of etiology. -EKG  Anemia of chronic disease Stable. Secondary to kidney disease   DVT prophylaxis: heparin subq Code Status: Full code Family Communication: None at bedside Disposition Plan: SNF when medically stable   Consultants:   Palliative care medicine  Neurology  Nephrology  Procedures:   Dialysis  MWF  Antimicrobials:  Vancomycin  Cefepime  Zosyn    Subjective: Patient still non-verbal  Objective: Vitals:   06/27/16 0040 06/27/16 0240 06/27/16 0530 06/27/16 0912  BP:  (!) 118/58 129/63 (!) 144/90  Pulse: (!) 118 (!) 118 (!) 112 (!) 110  Resp: (!) 28   20  Temp: (!) 102.6 F (39.2 C) 100.2 F (37.9 C) 97.3 F (36.3 C) 98.4 F (36.9 C)  TempSrc: Axillary Axillary Axillary Axillary  SpO2: 100% 99% 99% 100%  Height:        Intake/Output Summary (Last 24 hours) at 06/27/16 1239 Last data filed at 06/27/16 0400  Gross per 24 hour  Intake           794.17 ml  Output              302 ml  Net           492.17 ml   Filed Weights    Examination:  General exam: Appears calm and comfortable Respiratory system: Clear to auscultation. Respiratory effort normal. Cardiovascular system: S1 & S2 heard, elevated rate, normal rhythm. No murmurs. Gastrointestinal system: Abdomen is nondistended, soft and nontender. Normal bowel sounds heard. Central nervous system: Alert and unable to assess orientation. Extremities: No edema. No calf tenderness. Induration of left lateral thigh Skin: No cyanosis. No rashes. No cellulitis. Psychiatry: not able to assess    Data Reviewed: I have personally reviewed following labs and imaging studies  CBC:  Recent Labs Lab 06/24/16 0224 06/25/16 0218 06/26/16 0624 06/26/16 0713 06/27/16 0501  WBC 16.0* 16.3* 15.5* 17.5* 18.9*  NEUTROABS  --  11.6* 10.3*  --   --   HGB 8.3* 6.8* 8.1* 7.2* 7.1*  HCT 27.2* 23.0* 26.4* 23.6* 24.0*  MCV 92.2 91.6 90.7 90.1 93.4  PLT 375 393 389 383 396   Basic Metabolic Panel:  Recent Labs Lab 06/21/16 1330 06/23/16 1229 06/24/16 0224 06/25/16 0218 06/26/16 0624 06/26/16 0713 06/27/16 0501  NA 136 131* 135 134* 135 134* 134*  K 3.9 4.0 3.8 3.9 3.9 3.8 4.2  CL 98* 94* 95* 94* 95* 94* 97*  CO2 21* 23 21* 28 25 26 27   GLUCOSE 162* 433* 230* 173* 63* 64* 167*  BUN 64* 62* 84* 40* 73* 73*  37*  CREATININE 3.69* 3.34* 3.65* 1.97* 2.83* 2.83* 1.95*  CALCIUM 9.1 9.0 9.2 8.5* 9.1 8.9 8.6*  PHOS 5.3* 4.4  --   --   --  2.6  --    GFR: Estimated Creatinine Clearance: 43.5 mL/min (A) (by C-G formula based on SCr of 1.95 mg/dL (H)). Liver Function Tests:  Recent Labs Lab 06/21/16 1330 06/23/16 1229 06/26/16 0624 06/26/16 0713  AST  --   --  17  --   ALT  --   --  21  --   ALKPHOS  --   --  152*  --   BILITOT  --   --  0.6  --   PROT  --   --  7.6  --   ALBUMIN 1.3* 1.4* 1.4* 1.1*   No results for input(s): LIPASE, AMYLASE in the last 168 hours. No results for input(s): AMMONIA in the last 168 hours. Coagulation Profile: No results for input(s): INR, PROTIME in the last 168 hours. Cardiac Enzymes: No results for input(s): CKTOTAL, CKMB, CKMBINDEX, TROPONINI in the last 168 hours. BNP (last 3 results) No results for input(s): PROBNP in the last 8760 hours. HbA1C: No results for input(s): HGBA1C in the last 72 hours. CBG:  Recent Labs Lab 06/26/16 2008 06/27/16 0007 06/27/16 0355 06/27/16 0725 06/27/16 1105  GLUCAP 202* 196* 139* 181* 179*   Lipid Profile: No results for input(s): CHOL, HDL, LDLCALC, TRIG, CHOLHDL, LDLDIRECT in the last 72 hours. Thyroid Function Tests: No results for input(s): TSH, T4TOTAL, FREET4, T3FREE, THYROIDAB in the last 72 hours. Anemia Panel: No results for input(s): VITAMINB12, FOLATE, FERRITIN, TIBC, IRON, RETICCTPCT in the last 72 hours. Sepsis Labs: No results for input(s): PROCALCITON, LATICACIDVEN in the last 168 hours.  Recent Results (from the past 240 hour(s))  MRSA PCR Screening     Status: None   Collection Time: 06/25/16  2:26 AM  Result Value Ref Range Status   MRSA by PCR NEGATIVE NEGATIVE Final    Comment:        The GeneXpert MRSA Assay (FDA approved for NASAL specimens only), is one component of a comprehensive MRSA colonization surveillance program. It is not intended to diagnose MRSA infection nor to  guide or monitor treatment for MRSA infections.   Culture, blood (routine x 2)     Status: None (Preliminary result)   Collection Time: 06/25/16  7:02 PM  Result Value Ref Range Status   Specimen Description BLOOD RIGHT HAND  Final   Special Requests IN PEDIATRIC BOTTLE Blood Culture adequate volume  Final   Culture NO GROWTH 2 DAYS  Final   Report Status PENDING  Incomplete  Culture, blood (routine x 2)     Status: None (Preliminary result)   Collection Time: 06/25/16  7:40 PM  Result Value Ref Range Status   Specimen Description BLOOD RIGHT HAND  Final   Special Requests IN PEDIATRIC BOTTLE Blood Culture adequate  volume  Final   Culture NO GROWTH 2 DAYS  Final   Report Status PENDING  Incomplete         Radiology Studies: Dg Chest Port 1 View  Result Date: 06/25/2016 CLINICAL DATA:  Persistent low-grade fever with dyspnea. EXAM: PORTABLE CHEST 1 VIEW COMPARISON:  06/10/2016 FINDINGS: Interval removal of right IJ central venous catheter. Lungs are hypoinflated without focal consolidation or effusion. No pneumothorax. Cardiomediastinal silhouette and remainder of the exam is unchanged. IMPRESSION: Hypoinflation without acute cardiopulmonary disease. Electronically Signed   By: Elberta Fortis M.D.   On: 06/25/2016 15:55        Scheduled Meds: . aspirin  325 mg Per Tube Daily  . atorvastatin  20 mg Per Tube QPM  . chlorhexidine  15 mL Mouth Rinse BID  . darbepoetin (ARANESP) injection - DIALYSIS  200 mcg Intravenous Q Wed-HD  . feeding supplement (PRO-STAT SUGAR FREE 64)  30 mL Per Tube QID  . FLUoxetine  20 mg Per Tube Daily  . heparin subcutaneous  5,000 Units Subcutaneous Q8H  . insulin aspart  0-20 Units Subcutaneous Q4H  . insulin glargine  35 Units Subcutaneous BID  . metoprolol tartrate  25 mg Per Tube BID  . sucroferric oxyhydroxide  500 mg Oral TID WC  . Zinc Oxide   Topical BID   Continuous Infusions: . sodium chloride    . sodium chloride    . sodium  chloride    . feeding supplement (OSMOLITE 1.5 CAL) 1,000 mL (06/27/16 0300)  . piperacillin-tazobactam (ZOSYN)  IV Stopped (06/27/16 0101)  . sodium chloride    . vancomycin Stopped (06/26/16 1900)     LOS: 19 days     Jacquelin Hawking, MD [facility] 06/27/2016, 12:39 PM Pager: [phone]  If 7PM-7AM, please contact night-coverage [email or web address] Password [facility] 06/27/2016, 12:39 PM

## 2016-06-27 NOTE — Progress Notes (Signed)
[facility] Progress Note   Subjective: Remains awake, but non-verbal. Fevers overnight.     Objective Vitals:   06/27/16 0040 06/27/16 0240 06/27/16 0530 06/27/16 0912  BP:  (!) 118/58 129/63 (!) 144/90  Pulse: (!) 118 (!) 118 (!) 112 (!) 110  Resp: (!) 28   20  Temp: (!) 102.6 F (39.2 C) 100.2 F (37.9 C) 97.3 F (36.3 C) 98.4 F (36.9 C)  TempSrc: Axillary Axillary Axillary Axillary  SpO2: 100% 99% 99% 100%  Height:       Physical Exam General: Awake, eyes open. Not tracking or following commands. Heart: Tachycardic, normal rhythm Lungs: CTA anteriorly Extremities: No LE edema; wpund VAC L thigh Dialysis Access: LUE AVG + bruit  Additional Objective Labs: Basic Metabolic Panel:  Recent Labs Lab 06/21/16 1330 06/23/16 1229  06/26/16 0624 06/26/16 0713 06/27/16 0501  NA 136 131*  < > 135 134* 134*  K 3.9 4.0  < > 3.9 3.8 4.2  CL 98* 94*  < > 95* 94* 97*  CO2 21* 23  < > 25 26 27   GLUCOSE 162* 433*  < > 63* 64* 167*  BUN 64* 62*  < > 73* 73* 37*  CREATININE 3.69* 3.34*  < > 2.83* 2.83* 1.95*  CALCIUM 9.1 9.0  < > 9.1 8.9 8.6*  PHOS 5.3* 4.4  --   --  2.6  --   < > = values in this interval not displayed. Liver Function Tests:  Recent Labs Lab 06/23/16 1229 06/26/16 0624 06/26/16 0713  AST  --  17  --   ALT  --  21  --   ALKPHOS  --  152*  --   BILITOT  --  0.6  --   PROT  --  7.6  --   ALBUMIN 1.4* 1.4* 1.1*   CBC:  Recent Labs Lab 06/24/16 0224 06/25/16 0218 06/26/16 0624 06/26/16 0713 06/27/16 0501  WBC 16.0* 16.3* 15.5* 17.5* 18.9*  NEUTROABS  --  11.6* 10.3*  --   --   HGB 8.3* 6.8* 8.1* 7.2* 7.1*  HCT 27.2* 23.0* 26.4* 23.6* 24.0*  MCV 92.2 91.6 90.7 90.1 93.4  PLT 375 393 389 383 396   CBG:  Recent Labs Lab 06/26/16 2008 06/27/16 0007 06/27/16 0355 06/27/16 0725 06/27/16 1105  GLUCAP 202* 196* 139* 181* 179*   Studies/Results: Dg Chest Port 1 View  Result Date: 06/25/2016 CLINICAL DATA:  Persistent  low-grade fever with dyspnea. EXAM: PORTABLE CHEST 1 VIEW COMPARISON:  05/17/2016 FINDINGS: Interval removal of right IJ central venous catheter. Lungs are hypoinflated without focal consolidation or effusion. No pneumothorax. Cardiomediastinal silhouette and remainder of the exam is unchanged. IMPRESSION: Hypoinflation without acute cardiopulmonary disease. Electronically Signed   By: Elberta Fortisaniel  Boyle M.D.   On: 06/25/2016 15:55   Medications: . sodium chloride    . sodium chloride    . feeding supplement (OSMOLITE 1.5 CAL) 1,000 mL (06/27/16 0300)  . piperacillin-tazobactam (ZOSYN)  IV Stopped (06/27/16 0101)  . sodium chloride    . vancomycin Stopped (06/26/16 1900)   . aspirin  325 mg Per Tube Daily  . atorvastatin  20 mg Per Tube QPM  . chlorhexidine  15 mL Mouth Rinse BID  . darbepoetin (ARANESP) injection - DIALYSIS  200 mcg Intravenous Q Wed-HD  . feeding supplement (PRO-STAT SUGAR FREE 64)  30 mL Per Tube QID  . FLUoxetine  20 mg Per Tube Daily  . heparin subcutaneous  5,000 Units Subcutaneous Q8H  .  insulin aspart  0-20 Units Subcutaneous Q4H  . insulin glargine  35 Units Subcutaneous BID  . metoprolol tartrate  25 mg Per Tube BID  . sucroferric oxyhydroxide  500 mg Oral TID WC  . 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 Continue Vanc/Zosyn. 5  ID possible veg on TTE, not seen on TEE; plan 4 wks empiric abx per ID 6  HTN home coreg on hold, using prn IV meds 7  Anemia of CKD - max darbe 200 / wk, no IV Fe given infection. Will transfuse with HD 5/18 8  MBD - low Ca bath and no vit D as serum Ca running high here. nonCa+  binders.     Plan - HD tomorrow  Ozzie Hoyle, Cordelia Poche 06/27/2016, 12:27 PM  [facility] Pager: [phone]  Pt seen, examined and agree w A/P as above.  Vinson Moselle MD [employer] pager [phone]   06/27/2016, 1:56 PM

## 2016-06-27 NOTE — Progress Notes (Signed)
Patient has axillary temp of 102.6, unrelieved by 650 mg Tylenol via PEG tube. TRH notified. RN will continue to monitor patient.

## 2016-06-27 NOTE — Progress Notes (Signed)
Pharmacy Antibiotic Note Linda DykesDavenia McKinnon [name] is a 41 y.o. female admitted on 06/04/2016 with sepsis. D18 of abx for possible sepsis; found with possible vegetation on L coronary cusp on TTE, TEE showed no vegetation. LLE thigh wound - s/p I&D 4/10 and repeat 5/1.   New fevers with Tmax 102.6, WBC 18.9 (trending up). Vancomycin random level this morning at 23 is within desired preHD range of 15 -25, consistent with previous level, on Vancomycin 1 g IV MWF-HD. Plans to continue with MWF IHD.   Plan: 1. Continue vancomycin 1g IV QHD-MWF 2. Remains on Zosyn 3.375 gm IV q12h (4 hour infusion) 3. Weekly preHD levels unless clinical course dictates otherwise  4. Per ID's note, total length of therapy for 30 days   Height: 5\' 2"  (157.5 cm) Weight:  (unable to obtain; bed scale not working) IBW/kg (Calculated) : 50.1  Temp (24hrs), Avg:99.8 F (37.7 C), Min:97.3 F (36.3 C), Max:102.6 F (39.2 C)   Recent Labs Lab 06/24/16 0224 06/25/16 0218 06/26/16 0624 06/26/16 0713 06/27/16 0501  WBC 16.0* 16.3* 15.5* 17.5* 18.9*  CREATININE 3.65* 1.97* 2.83* 2.83* 1.95*  VANCORANDOM 23  --   --   --  23    Estimated Creatinine Clearance: 43.5 mL/min (A) (by C-G formula based on SCr of 1.95 mg/dL (H)).    Allergies  Allergen Reactions  . Azithromycin Other (See Comments)    Nose bleeding event   Antibiotics since admission:  Vancomycin 4/30 >> (5/28)  Zosyn 4/30>> 5/2; 5/7 >> (5/28) Cefepime 5/2 >> 5/7    Pertinent levels:  5/9 vancomycin pre HD level 29 5/14 vancomycin preHD level 23 5/17 vancomycin preHD level 23  Culture data:  4/10 wound culture: mult orgs present, abundant bacteroides ovatus beta lactamase + RPR Reactive: T Pallidum Abx neg, likely false positive RPR 4/30 BCx: ngtd 5/1 LLE abscess: few GNR> mult spec present  Linda BossierApryl Lisle [name], PharmD PGY1 Pharmacy Resident [phone] (Pager) 06/27/2016 10:48 AM

## 2016-06-28 LAB — CBC
HCT: 22.4 % — ABNORMAL LOW (ref 36.0–46.0)
Hemoglobin: 6.7 g/dL — CL (ref 12.0–15.0)
MCH: 28.3 pg (ref 26.0–34.0)
MCHC: 29.9 g/dL — ABNORMAL LOW (ref 30.0–36.0)
MCV: 94.5 fL (ref 78.0–100.0)
PLATELETS: 417 10*3/uL — AB (ref 150–400)
RBC: 2.37 MIL/uL — ABNORMAL LOW (ref 3.87–5.11)
RDW: 19.9 % — AB (ref 11.5–15.5)
WBC: 19.4 10*3/uL — AB (ref 4.0–10.5)

## 2016-06-28 LAB — RENAL FUNCTION PANEL
ALBUMIN: 1 g/dL — AB (ref 3.5–5.0)
Anion gap: 11 (ref 5–15)
BUN: 69 mg/dL — AB (ref 6–20)
CALCIUM: 8.9 mg/dL (ref 8.9–10.3)
CO2: 25 mmol/L (ref 22–32)
CREATININE: 2.96 mg/dL — AB (ref 0.44–1.00)
Chloride: 98 mmol/L — ABNORMAL LOW (ref 101–111)
GFR calc Af Amer: 22 mL/min — ABNORMAL LOW (ref >60)
GFR, EST NON AFRICAN AMERICAN: 19 mL/min — AB (ref >60)
Glucose, Bld: 173 mg/dL — ABNORMAL HIGH (ref 65–99)
Phosphorus: 2.7 mg/dL (ref 2.5–4.6)
Potassium: 4.2 mmol/L (ref 3.5–5.1)
SODIUM: 134 mmol/L — AB (ref 135–145)

## 2016-06-28 LAB — GLUCOSE, CAPILLARY
GLUCOSE-CAPILLARY: 154 mg/dL — AB (ref 65–99)
Glucose-Capillary: 145 mg/dL — ABNORMAL HIGH (ref 65–99)
Glucose-Capillary: 150 mg/dL — ABNORMAL HIGH (ref 65–99)
Glucose-Capillary: 151 mg/dL — ABNORMAL HIGH (ref 65–99)
Glucose-Capillary: 183 mg/dL — ABNORMAL HIGH (ref 65–99)
Glucose-Capillary: 193 mg/dL — ABNORMAL HIGH (ref 65–99)

## 2016-06-28 NOTE — Progress Notes (Signed)
PROGRESS NOTE    [name]  ZOX:[id number] DOB: 10/17/1975 DOA: 05/28/2016 PCP: Marletta Lor, NP   Brief Narrative: [name] is a 42 y.o. female with a history of CVA, DM type 2, chronic lymphedema, HTN, chronic diastolic heart failure, depression. She presented with acute altered mental status and found to have new CVA.   Assessment & Plan:   Principal Problem:   CVA (cerebral vascular accident) [facility]) Active Problems:   Morbid obesity (HCC)   Essential hypertension   Acute metabolic encephalopathy   Uncontrolled diabetes mellitus with diabetic nephropathy, with long-term current use of insulin (HCC)   ESRD (end stage renal disease) (HCC)   Depression due to physical illness   Wound abscess   Palliative care encounter   Acute on chronic metabolic encephalopathy Per chart review, appears that this may be new baseline per previous evaluation.  Acute multifocal CVAs History of CVAs Neurology evaluated and signed off -continue aspirin  L posterior/medial thigh wound Fevers Afebrile overnight. Blood cultures have been negative. CT without abscess. -continue Zosyn and vancomycin -repeat blood cultures negative -urinalysis/culture if able  ESRD on HD Dialysis MWF  Reactive RPR t pallidum abs negative.  Morbid obesity Body mass index is 41.41 kg/m  Nutrition PEG tube  Essential hypertension -continue metoprolol  Diabetes mellitus -continue Lantus and SSI  Chronic diastolic heart failure Last TTE on 4/28 with EF of 55-60% with grade 1 diastolic dysfunction. EDW of 105.5kg  Leukocytosis No evidence of abscess. Unknown source.  Tachycardia Unsure of etiology. -EKG  Anemia of chronic disease 6.7 hemoglobin today. May be addressed during dialysis.   DVT prophylaxis: heparin subq Code Status: Full code Family Communication: None at bedside Disposition Plan: SNF when medically stable   Consultants:   Palliative care  medicine  Neurology  Nephrology  Procedures:   Dialysis MWF  Antimicrobials:  Vancomycin  Cefepime  Zosyn    Subjective: Patient still non-verbal  Objective: Vitals:   06/28/16 1242 06/28/16 1300 06/28/16 1330 06/28/16 1345  BP: (!) 125/57 (!) 120/56 (!) 119/59 (!) 86/54  Pulse: (!) 110 (!) 120 (!) 123 (!) 120  Resp:   20 (!) 24  Temp:    99 F (37.2 C)  TempSrc:    Axillary  SpO2:      Weight:      Height:        Intake/Output Summary (Last 24 hours) at 06/28/16 1412 Last data filed at 06/28/16 1345  Gross per 24 hour  Intake              280 ml  Output                0 ml  Net              280 ml   Filed Weights   06/28/16 1238  Weight: 102 kg (224 lb 13.9 oz)    Examination:  General exam: Appears calm and comfortable Respiratory system: Clear to auscultation. Respiratory effort normal. Cardiovascular system: S1 & S2 heard, elevated rate, normal rhythm. No murmurs. Gastrointestinal system: Abdomen is nondistended, soft and nontender. Normal bowel sounds heard. Central nervous system: Alert and unable to assess orientation. Extremities: No edema. No calf tenderness. Induration of left lateral thigh Skin: No cyanosis. No rashes. No cellulitis. Psychiatry: not able to assess  Exam unchanged from 06/27/16    Data Reviewed: I have personally reviewed following labs and imaging studies  CBC:  Recent Labs Lab 06/25/16 0218 06/26/16 8119  06/26/16 0713 06/27/16 0501 06/28/16 1233  WBC 16.3* 15.5* 17.5* 18.9* 19.4*  NEUTROABS 11.6* 10.3*  --   --   --   HGB 6.8* 8.1* 7.2* 7.1* 6.7*  HCT 23.0* 26.4* 23.6* 24.0* 22.4*  MCV 91.6 90.7 90.1 93.4 94.5  PLT 393 389 383 396 417*   Basic Metabolic Panel:  Recent Labs Lab 06/23/16 1229  06/25/16 0218 06/26/16 0624 06/26/16 0713 06/27/16 0501 06/28/16 1233  NA 131*  < > 134* 135 134* 134* 134*  K 4.0  < > 3.9 3.9 3.8 4.2 4.2  CL 94*  < > 94* 95* 94* 97* 98*  CO2 23  < > 28 25 26 27 25     GLUCOSE 433*  < > 173* 63* 64* 167* 173*  BUN 62*  < > 40* 73* 73* 37* 69*  CREATININE 3.34*  < > 1.97* 2.83* 2.83* 1.95* 2.96*  CALCIUM 9.0  < > 8.5* 9.1 8.9 8.6* 8.9  PHOS 4.4  --   --   --  2.6  --  2.7  < > = values in this interval not displayed. GFR: Estimated Creatinine Clearance: 28.6 mL/min (A) (by C-G formula based on SCr of 2.96 mg/dL (H)). Liver Function Tests:  Recent Labs Lab 06/23/16 1229 06/26/16 0624 06/26/16 0713 06/28/16 1233  AST  --  17  --   --   ALT  --  21  --   --   ALKPHOS  --  152*  --   --   BILITOT  --  0.6  --   --   PROT  --  7.6  --   --   ALBUMIN 1.4* 1.4* 1.1* 1.0*   No results for input(s): LIPASE, AMYLASE in the last 168 hours. No results for input(s): AMMONIA in the last 168 hours. Coagulation Profile: No results for input(s): INR, PROTIME in the last 168 hours. Cardiac Enzymes: No results for input(s): CKTOTAL, CKMB, CKMBINDEX, TROPONINI in the last 168 hours. BNP (last 3 results) No results for input(s): PROBNP in the last 8760 hours. HbA1C: No results for input(s): HGBA1C in the last 72 hours. CBG:  Recent Labs Lab 06/27/16 1954 06/28/16 0018 06/28/16 0413 06/28/16 0807 06/28/16 1125  GLUCAP 179* 193* 151* 145* 154*   Lipid Profile: No results for input(s): CHOL, HDL, LDLCALC, TRIG, CHOLHDL, LDLDIRECT in the last 72 hours. Thyroid Function Tests: No results for input(s): TSH, T4TOTAL, FREET4, T3FREE, THYROIDAB in the last 72 hours. Anemia Panel: No results for input(s): VITAMINB12, FOLATE, FERRITIN, TIBC, IRON, RETICCTPCT in the last 72 hours. Sepsis Labs: No results for input(s): PROCALCITON, LATICACIDVEN in the last 168 hours.  Recent Results (from the past 240 hour(s))  MRSA PCR Screening     Status: None   Collection Time: 06/25/16  2:26 AM  Result Value Ref Range Status   MRSA by PCR NEGATIVE NEGATIVE Final    Comment:        The GeneXpert MRSA Assay (FDA approved for NASAL specimens only), is one component of  a comprehensive MRSA colonization surveillance program. It is not intended to diagnose MRSA infection nor to guide or monitor treatment for MRSA infections.   Culture, blood (routine x 2)     Status: None (Preliminary result)   Collection Time: 06/25/16  7:02 PM  Result Value Ref Range Status   Specimen Description BLOOD RIGHT HAND  Final   Special Requests IN PEDIATRIC BOTTLE Blood Culture adequate volume  Final   Culture NO GROWTH  2 DAYS  Final   Report Status PENDING  Incomplete  Culture, blood (routine x 2)     Status: None (Preliminary result)   Collection Time: 06/25/16  7:40 PM  Result Value Ref Range Status   Specimen Description BLOOD RIGHT HAND  Final   Special Requests IN PEDIATRIC BOTTLE Blood Culture adequate volume  Final   Culture NO GROWTH 2 DAYS  Final   Report Status PENDING  Incomplete         Radiology Studies: Ct Femur Left Wo Contrast  Result Date: 06/27/2016 CLINICAL DATA:  Encephalopathy.  Left leg wound EXAM: CT OF THE LOWER LEFT EXTREMITY WITHOUT CONTRAST TECHNIQUE: Multidetector CT imaging of the lower left extremity was performed according to the standard protocol. COMPARISON:  06/09/2016 FINDINGS: Bones/Joint/Cartilage No acute fracture or dislocation. Normal alignment. No joint effusion. No periosteal reaction or bone destruction. Tricompartmental mild -moderate osteoarthritis of the left knee with joint space narrowing and marginal osteophytosis. Ligaments Ligaments are suboptimally evaluated by CT. Muscles and Tendons Muscles are normal. Soft tissue Soft tissue wound along the inferior medial left thigh with a wound vac present. Soft tissue inflammation in the adjacent subcutaneous fat of the thigh. Soft tissue abnormality along the upper medial left thigh unchanged compared with the prior examination likely reflecting an area of scarring. No focal fluid collection to suggest an abscess. No soft tissue mass. Peripheral vascular atherosclerotic disease.  IMPRESSION: 1. Soft tissue wound along the inferior medial left thigh with a wound vac present. Surrounding cellulitis. No drainable fluid collection to suggest an abscess. 2. Soft tissue abnormality along the upper medial left thigh unchanged compared with the prior examination likely reflecting an area of scarring. Electronically Signed   By: Elige Ko   On: 06/27/2016 14:56        Scheduled Meds: . aspirin  325 mg Per Tube Daily  . atorvastatin  20 mg Per Tube QPM  . chlorhexidine  15 mL Mouth Rinse BID  . darbepoetin (ARANESP) injection - DIALYSIS  200 mcg Intravenous Q Wed-HD  . feeding supplement (PRO-STAT SUGAR FREE 64)  30 mL Per Tube QID  . FLUoxetine  20 mg Per Tube Daily  . heparin subcutaneous  5,000 Units Subcutaneous Q8H  . insulin aspart  0-20 Units Subcutaneous Q4H  . insulin glargine  35 Units Subcutaneous BID  . metoprolol tartrate  25 mg Per Tube BID  . sucroferric oxyhydroxide  500 mg Oral TID WC  . Zinc Oxide   Topical BID   Continuous Infusions: . sodium chloride    . sodium chloride    . sodium chloride    . feeding supplement (OSMOLITE 1.5 CAL) 1,000 mL (06/28/16 0205)  . piperacillin-tazobactam (ZOSYN)  IV    . sodium chloride    . vancomycin Stopped (06/26/16 1900)     LOS: 20 days     Jacquelin Hawking, MD [facility] 06/28/2016, 2:12 PM Pager: [phone]  If 7PM-7AM, please contact night-coverage [email or web address] Password [email or web address] 06/28/2016, 2:12 PM

## 2016-06-28 NOTE — Progress Notes (Signed)
Physical Therapy Discharge Patient Details Name: Linda DykesDavenia McKinnon [name] MRN: [id number] DOB: 04/20/1975 Today's Date: 06/28/2016 Time: [id number] PT Time Calculation (min) (ACUTE ONLY): 30 min  Patient discharged from PT services secondary to patient has made no progress toward goals in a reasonable time frame.  Please see latest therapy progress note for current level of functioning and progress toward goals.    Progress and discharge plan discussed with patient and/or caregiver: Patient unable to participate in discharge planning and no caregivers available  GP     Alessandra BevelsJennifer M Zaul Hubers 06/28/2016, 11:12 AM

## 2016-06-28 NOTE — Progress Notes (Signed)
RN changed foam dressing on left calf with RN Elease HashimotoPatricia. Pt's skin appears to have large amount of yellowish drainage with some loose skin in back of calf. New pink foam dressing placed covering wound. May need wound care team to assess. RN Elease HashimotoPatricia ordered wound care assessment through Epic. Will continue to follow.

## 2016-06-28 NOTE — Progress Notes (Signed)
Patients LLE posterior calf pink foam dressing changed with oncoming RN. Would aprx 4-5 in long, 3 in wide, 4 cm deep with thick, dark drainage, large amount of sloth and malodorous. Patient in need of wound care team consult for posterior lower calf assessment and appropriate wound care instructions appreciated. Patients axillary temp continues to elevate, 650 mg Tylenol mildly effective. RN will continue to monitor.

## 2016-06-28 NOTE — Procedures (Signed)
Patient was seen on dialysis and the procedure was supervised.  BFR 350  Via AVG BP is  115/12 ??.   Patient appears to be tolerating treatment well  [name] 06/28/2016

## 2016-06-28 NOTE — Progress Notes (Signed)
RN received call stating critical lab value for hemoglobin of 6.7. RN paged MD using amion. Pt is currently in dialysis. Will continue to follow.

## 2016-06-28 NOTE — Progress Notes (Signed)
Physical Therapy Treatment Patient Details Name: Linda DykesDavenia McKinnon [name] MRN: [id number] DOB: 05/26/1975 Today's Date: 06/28/2016    History of Present Illness Patient is a 41 yo female admitted 05-22-16 with AMS from Dialysis Center.  Patient with Rt infarcts, occluded MCA, Rt ICA, and Lt vertebral artery. No verbalizations or voluntary movement.   PMH:  CVA with RLE weak, DM, obesity, chronic lymphedema, wound Rt calf, VAC on Lt thigh, HTN, CHF, depression, ESRD on HD, asthma    PT Comments    Pt presented supine in bed, awake and soiled from flexiseal leaking. Pt required two person total A to roll bilaterally with nurse tech assisting with pericare. Pt not responding or able to participate in session.  Pt has been seen and/or attempted visits x10 this admission with no signs of progress or participation. At this time, do not feel that PT services are appropriate or providing any benefit to the patient. Please re-order if this changes. Thank you!   Follow Up Recommendations  SNF;Supervision/Assistance - 24 hour     Equipment Recommendations  Hospital bed    Recommendations for Other Services       Precautions / Restrictions Precautions Precautions: Fall Precaution Comments: wound vac to L inner thigh; flexiseal Restrictions Weight Bearing Restrictions: No    Mobility  Bed Mobility Overal bed mobility: Needs Assistance Bed Mobility: Rolling Rolling: Total assist;+2 for physical assistance         General bed mobility comments: pt soiled upon entering room. PT and PT tech assisting with bed mobility while nurse tech providing pericare. Pt's RN also changing dressings as well.  Transfers                    Ambulation/Gait                 Stairs            Wheelchair Mobility    Modified Rankin (Stroke Patients Only)       Balance                                            Cognition Arousal/Alertness:  Awake/alert Behavior During Therapy: Flat affect Overall Cognitive Status: Difficult to assess Area of Impairment: Following commands;Awareness                       Following Commands:  (does not follow commands)   Awareness:  (pre-intellectual)   General Comments: Pt not verbally communicating or following commands.      Exercises      General Comments        Pertinent Vitals/Pain Pain Assessment: Faces Faces Pain Scale: No hurt Pain Intervention(s): Monitored during session    Home Living                      Prior Function            PT Goals (current goals can now be found in the care plan section) Acute Rehab PT Goals PT Goal Formulation: Patient unable to participate in goal setting Time For Goal Achievement: 07/09/16 Potential to Achieve Goals: Poor Progress towards PT goals: Not progressing toward goals - comment (pt with significant functional decline and not participating)    Frequency    Other (Comment) (d/c pt from PT)      PT Plan  Discharge plan needs to be updated;Frequency needs to be updated    Co-evaluation              AM-PAC PT "6 Clicks" Daily Activity  Outcome Measure  Difficulty turning over in bed (including adjusting bedclothes, sheets and blankets)?: Total Difficulty moving from lying on back to sitting on the side of the bed? : Total Difficulty sitting down on and standing up from a chair with arms (e.g., wheelchair, bedside commode, etc,.)?: Total Help needed moving to and from a bed to chair (including a wheelchair)?: Total Help needed walking in hospital room?: Total Help needed climbing 3-5 steps with a railing? : Total 6 Click Score: 6    End of Session   Activity Tolerance: Patient limited by lethargy Patient left: in bed;with call bell/phone within reach Nurse Communication: Mobility status PT Visit Diagnosis: Other symptoms and signs involving the nervous system (R29.898);Muscle weakness  (generalized) (M62.81)     Time: 1610-9604 PT Time Calculation (min) (ACUTE ONLY): 30 min  Charges:  $Therapeutic Activity: 23-37 mins                    G Codes:       [place], PT, DPT [phone]    Alessandra Bevels Seline Enzor 06/28/2016, 11:07 AM

## 2016-06-28 NOTE — Clinical Social Work Note (Signed)
CSW spoke with sister-Christine Gatling [phone]) to provide [facility] offers. [facility] CSW expressed that pt's husband would need to call and confirm bed choice. Sister to call husband and relay information for husband to call back and confirm. CSW will continue to follow for discharge needs.   Corlis HoveJeneya Jaythan Hinely, LCSWA, LCASA Clinical Social Work [phone]

## 2016-06-28 NOTE — Progress Notes (Signed)
Patient flexiseal assessed, in place. Patient has continual watery, dark stool seeping. Patients sacral dressings changed x 2, wounds cleansed with sterile water and zinc oxide applied. RN ordered pink foam 2x2 and 6x6 x 10. Patient has wounds to BLE, LLE moderate amount of dark sanguinous drainage, sloth and malodorous. Patients wound vac dressing wet. Wound consult entered. RN will continue to monitor.

## 2016-06-29 LAB — BPAM RBC
Blood Product Expiration Date: 201805222359
Blood Product Expiration Date: 201805282359
Blood Product Expiration Date: 201806012359
ISSUE DATE / TIME: 201805150447
ISSUE DATE / TIME: 201805181340
ISSUE DATE / TIME: 201805181340
Unit Type and Rh: 6200
Unit Type and Rh: 6200
Unit Type and Rh: 6200

## 2016-06-29 LAB — CBC
HCT: 32.6 % — ABNORMAL LOW (ref 36.0–46.0)
Hemoglobin: 10.4 g/dL — ABNORMAL LOW (ref 12.0–15.0)
MCH: 29.2 pg (ref 26.0–34.0)
MCHC: 31.9 g/dL (ref 30.0–36.0)
MCV: 91.6 fL (ref 78.0–100.0)
Platelets: 386 10*3/uL (ref 150–400)
RBC: 3.56 MIL/uL — ABNORMAL LOW (ref 3.87–5.11)
RDW: 19 % — AB (ref 11.5–15.5)
WBC: 14.5 10*3/uL — ABNORMAL HIGH (ref 4.0–10.5)

## 2016-06-29 LAB — GLUCOSE, CAPILLARY
Glucose-Capillary: 192 mg/dL — ABNORMAL HIGH (ref 65–99)
Glucose-Capillary: 187 mg/dL — ABNORMAL HIGH (ref 65–99)
Glucose-Capillary: 215 mg/dL — ABNORMAL HIGH (ref 65–99)
Glucose-Capillary: 142 mg/dL — ABNORMAL HIGH (ref 65–99)
Glucose-Capillary: 84 mg/dL (ref 65–99)

## 2016-06-29 LAB — TYPE AND SCREEN
ABO/RH(D): A POS
Antibody Screen: NEGATIVE
Unit division: 0
Unit division: 0
Unit division: 0

## 2016-06-29 NOTE — Progress Notes (Signed)
PROGRESS NOTE    [name]  ZOX:[id number] DOB: 05-14-1975 DOA: 05/22/2016 PCP: Marletta Lor, NP   Brief Narrative: [name] is a 41 y.o. female with a history of CVA, DM type 2, chronic lymphedema, HTN, chronic diastolic heart failure, depression. She presented with acute altered mental status and found to have new CVA.   Assessment & Plan:   Principal Problem:   CVA (cerebral vascular accident) [facility]) Active Problems:   Morbid obesity (HCC)   Essential hypertension   Acute metabolic encephalopathy   Uncontrolled diabetes mellitus with diabetic nephropathy, with long-term current use of insulin (HCC)   ESRD (end stage renal disease) (HCC)   Depression due to physical illness   Wound abscess   Palliative care encounter   Acute on chronic metabolic encephalopathy Per chart review, appears that this may be new baseline per previous evaluation.  Acute multifocal CVAs History of CVAs Neurology evaluated and signed off -continue aspirin  L posterior/medial thigh wound Fevers Afebrile. Blood cultures have been negative. CT without abscess. -discontinue Zosyn and vancomycin as there is no source of infection -repeat blood cultures negative  ESRD on HD Dialysis MWF  Reactive RPR t pallidum abs negative.  Morbid obesity Body mass index is 41.41 kg/m  Nutrition PEG tube  Essential hypertension -continue metoprolol  Diabetes mellitus -continue Lantus and SSI  Chronic diastolic heart failure Last TTE on 4/28 with EF of 55-60% with grade 1 diastolic dysfunction. EDW of 105.5kg  Leukocytosis No evidence of abscess. Unknown source.  Tachycardia Unsure of etiology. EKG significant for sinus tachycardia. -continue metoprolol  Anemia of chronic disease S/p 2 units of PRBC in dialysis -repeat CBC   DVT prophylaxis: heparin subq Code Status: Full code Family Communication: None at bedside Disposition Plan: SNF when bed is  available.   Consultants:   Palliative care medicine  Neurology  Nephrology  Procedures:   Dialysis MWF  Antimicrobials:  Vancomycin  Cefepime  Zosyn    Subjective: Patient still non-verbal  Objective: Vitals:   06/28/16 2126 06/29/16 0119 06/29/16 0629 06/29/16 1105  BP: 125/71 (!) 141/92 (!) 152/79 113/78  Pulse: (!) 108 (!) 112 (!) 116 99  Resp: 20 20 20 18   Temp: 99.1 F (37.3 C) 99.1 F (37.3 C) 99.4 F (37.4 C) 98.9 F (37.2 C)  TempSrc: Axillary Axillary Axillary Oral  SpO2: 96% 98% 98% 100%  Weight:      Height:        Intake/Output Summary (Last 24 hours) at 06/29/16 1215 Last data filed at 06/28/16 1640  Gross per 24 hour  Intake             2270 ml  Output             1500 ml  Net              770 ml   Filed Weights   06/28/16 1238 06/28/16 1640  Weight: 102 kg (224 lb 13.9 oz) 100.5 kg (221 lb 9 oz)    Examination:  General exam: Appears calm and comfortable Respiratory system: Clear to auscultation. Respiratory effort normal. Cardiovascular system: S1 & S2 heard, elevated rate, normal rhythm. No murmurs. Gastrointestinal system: Abdomen is nondistended, soft and nontender. Normal bowel sounds heard. Central nervous system: Alert and unable to assess orientation. Extremities: No edema. No calf tenderness. Induration of left lateral thigh Skin: No cyanosis. No rashes. No cellulitis. Psychiatry: not able to assess  Exam unchanged from 06/28/16    Data  Reviewed: I have personally reviewed following labs and imaging studies  CBC:  Recent Labs Lab 06/25/16 0218 06/26/16 0624 06/26/16 0713 06/27/16 0501 06/28/16 1233  WBC 16.3* 15.5* 17.5* 18.9* 19.4*  NEUTROABS 11.6* 10.3*  --   --   --   HGB 6.8* 8.1* 7.2* 7.1* 6.7*  HCT 23.0* 26.4* 23.6* 24.0* 22.4*  MCV 91.6 90.7 90.1 93.4 94.5  PLT 393 389 383 396 417*   Basic Metabolic Panel:  Recent Labs Lab 06/23/16 1229  06/25/16 0218 06/26/16 0624 06/26/16 0713  06/27/16 0501 06/28/16 1233  NA 131*  < > 134* 135 134* 134* 134*  K 4.0  < > 3.9 3.9 3.8 4.2 4.2  CL 94*  < > 94* 95* 94* 97* 98*  CO2 23  < > 28 25 26 27 25   GLUCOSE 433*  < > 173* 63* 64* 167* 173*  BUN 62*  < > 40* 73* 73* 37* 69*  CREATININE 3.34*  < > 1.97* 2.83* 2.83* 1.95* 2.96*  CALCIUM 9.0  < > 8.5* 9.1 8.9 8.6* 8.9  PHOS 4.4  --   --   --  2.6  --  2.7  < > = values in this interval not displayed. GFR: Estimated Creatinine Clearance: 28.3 mL/min (A) (by C-G formula based on SCr of 2.96 mg/dL (H)). Liver Function Tests:  Recent Labs Lab 06/23/16 1229 06/26/16 0624 06/26/16 0713 06/28/16 1233  AST  --  17  --   --   ALT  --  21  --   --   ALKPHOS  --  152*  --   --   BILITOT  --  0.6  --   --   PROT  --  7.6  --   --   ALBUMIN 1.4* 1.4* 1.1* 1.0*   No results for input(s): LIPASE, AMYLASE in the last 168 hours. No results for input(s): AMMONIA in the last 168 hours. Coagulation Profile: No results for input(s): INR, PROTIME in the last 168 hours. Cardiac Enzymes: No results for input(s): CKTOTAL, CKMB, CKMBINDEX, TROPONINI in the last 168 hours. BNP (last 3 results) No results for input(s): PROBNP in the last 8760 hours. HbA1C: No results for input(s): HGBA1C in the last 72 hours. CBG:  Recent Labs Lab 06/28/16 1936 06/28/16 2340 06/29/16 0351 06/29/16 0808 06/29/16 1139  GLUCAP 150* 183* 192* 187* 215*   Lipid Profile: No results for input(s): CHOL, HDL, LDLCALC, TRIG, CHOLHDL, LDLDIRECT in the last 72 hours. Thyroid Function Tests: No results for input(s): TSH, T4TOTAL, FREET4, T3FREE, THYROIDAB in the last 72 hours. Anemia Panel: No results for input(s): VITAMINB12, FOLATE, FERRITIN, TIBC, IRON, RETICCTPCT in the last 72 hours. Sepsis Labs: No results for input(s): PROCALCITON, LATICACIDVEN in the last 168 hours.  Recent Results (from the past 240 hour(s))  MRSA PCR Screening     Status: None   Collection Time: 06/25/16  2:26 AM  Result Value  Ref Range Status   MRSA by PCR NEGATIVE NEGATIVE Final    Comment:        The GeneXpert MRSA Assay (FDA approved for NASAL specimens only), is one component of a comprehensive MRSA colonization surveillance program. It is not intended to diagnose MRSA infection nor to guide or monitor treatment for MRSA infections.   Culture, blood (routine x 2)     Status: None (Preliminary result)   Collection Time: 06/25/16  7:02 PM  Result Value Ref Range Status   Specimen Description BLOOD RIGHT HAND  Final   Special Requests IN PEDIATRIC BOTTLE Blood Culture adequate volume  Final   Culture NO GROWTH 3 DAYS  Final   Report Status PENDING  Incomplete  Culture, blood (routine x 2)     Status: None (Preliminary result)   Collection Time: 06/25/16  7:40 PM  Result Value Ref Range Status   Specimen Description BLOOD RIGHT HAND  Final   Special Requests IN PEDIATRIC BOTTLE Blood Culture adequate volume  Final   Culture NO GROWTH 3 DAYS  Final   Report Status PENDING  Incomplete  Culture, blood (routine x 2)     Status: None (Preliminary result)   Collection Time: 06/27/16  1:11 PM  Result Value Ref Range Status   Specimen Description BLOOD RIGHT HAND  Final   Special Requests   Final    BOTTLES DRAWN AEROBIC ONLY Blood Culture results may not be optimal due to an inadequate volume of blood received in culture bottles   Culture NO GROWTH < 24 HOURS  Final   Report Status PENDING  Incomplete  Culture, blood (routine x 2)     Status: None (Preliminary result)   Collection Time: 06/27/16  2:28 PM  Result Value Ref Range Status   Specimen Description BLOOD RIGHT WRIST  Final   Special Requests IN PEDIATRIC BOTTLE Blood Culture adequate volume  Final   Culture NO GROWTH < 24 HOURS  Final   Report Status PENDING  Incomplete         Radiology Studies: Ct Femur Left Wo Contrast  Result Date: 06/27/2016 CLINICAL DATA:  Encephalopathy.  Left leg wound EXAM: CT OF THE LOWER LEFT EXTREMITY  WITHOUT CONTRAST TECHNIQUE: Multidetector CT imaging of the lower left extremity was performed according to the standard protocol. COMPARISON:  06/09/2016 FINDINGS: Bones/Joint/Cartilage No acute fracture or dislocation. Normal alignment. No joint effusion. No periosteal reaction or bone destruction. Tricompartmental mild -moderate osteoarthritis of the left knee with joint space narrowing and marginal osteophytosis. Ligaments Ligaments are suboptimally evaluated by CT. Muscles and Tendons Muscles are normal. Soft tissue Soft tissue wound along the inferior medial left thigh with a wound vac present. Soft tissue inflammation in the adjacent subcutaneous fat of the thigh. Soft tissue abnormality along the upper medial left thigh unchanged compared with the prior examination likely reflecting an area of scarring. No focal fluid collection to suggest an abscess. No soft tissue mass. Peripheral vascular atherosclerotic disease. IMPRESSION: 1. Soft tissue wound along the inferior medial left thigh with a wound vac present. Surrounding cellulitis. No drainable fluid collection to suggest an abscess. 2. Soft tissue abnormality along the upper medial left thigh unchanged compared with the prior examination likely reflecting an area of scarring. Electronically Signed   By: Elige Ko   On: 06/27/2016 14:56        Scheduled Meds: . aspirin  325 mg Per Tube Daily  . atorvastatin  20 mg Per Tube QPM  . chlorhexidine  15 mL Mouth Rinse BID  . darbepoetin (ARANESP) injection - DIALYSIS  200 mcg Intravenous Q Wed-HD  . feeding supplement (PRO-STAT SUGAR FREE 64)  30 mL Per Tube QID  . FLUoxetine  20 mg Per Tube Daily  . heparin subcutaneous  5,000 Units Subcutaneous Q8H  . insulin aspart  0-20 Units Subcutaneous Q4H  . insulin glargine  35 Units Subcutaneous BID  . metoprolol tartrate  25 mg Per Tube BID  . sucroferric oxyhydroxide  500 mg Oral TID WC  . Zinc Oxide  Topical BID   Continuous Infusions: .  sodium chloride    . sodium chloride    . sodium chloride    . feeding supplement (OSMOLITE 1.5 CAL) 1,000 mL (06/28/16 0205)  . piperacillin-tazobactam (ZOSYN)  IV Stopped (06/29/16 0909)  . sodium chloride    . vancomycin Stopped (06/26/16 1900)     LOS: 21 days     Jacquelin Hawkingalph Jakson Delpilar, MD [facility] 06/29/2016, 12:15 PM Pager: [phone]  If 7PM-7AM, please contact night-coverage [email or web address] Password [email or web address] 06/29/2016, 12:15 PM

## 2016-06-29 NOTE — Progress Notes (Signed)
[facility] Progress Note   Subjective: nonverbal  Vitals:   06/28/16 2126 06/29/16 0119 06/29/16 0629 06/29/16 1105  BP: 125/71 (!) 141/92 (!) 152/79 113/78  Pulse: (!) 108 (!) 112 (!) 116 99  Resp: 20 20 20 18   Temp: 99.1 F (37.3 C) 99.1 F (37.3 C) 99.4 F (37.4 C) 98.9 F (37.2 C)  TempSrc: Axillary Axillary Axillary Oral  SpO2: 96% 98% 98% 100%  Weight:      Height:        Inpatient medications: . aspirin  325 mg Per Tube Daily  . atorvastatin  20 mg Per Tube QPM  . chlorhexidine  15 mL Mouth Rinse BID  . darbepoetin (ARANESP) injection - DIALYSIS  200 mcg Intravenous Q Wed-HD  . feeding supplement (PRO-STAT SUGAR FREE 64)  30 mL Per Tube QID  . FLUoxetine  20 mg Per Tube Daily  . heparin subcutaneous  5,000 Units Subcutaneous Q8H  . insulin aspart  0-20 Units Subcutaneous Q4H  . insulin glargine  35 Units Subcutaneous BID  . metoprolol tartrate  25 mg Per Tube BID  . sucroferric oxyhydroxide  500 mg Oral TID WC  . Zinc Oxide   Topical BID   . sodium chloride    . sodium chloride    . sodium chloride    . feeding supplement (OSMOLITE 1.5 CAL) 1,000 mL (06/28/16 0205)  . piperacillin-tazobactam (ZOSYN)  IV Stopped (06/29/16 0909)  . sodium chloride    . vancomycin Stopped (06/26/16 1900)   sodium chloride, sodium chloride, [DISCONTINUED] acetaminophen **OR** [DISCONTINUED] acetaminophen (TYLENOL) oral liquid 160 mg/5 mL **OR** acetaminophen, acetaminophen, Gerhardt's butt cream, glucagon (human recombinant), hydrALAZINE, HYDROcodone-acetaminophen, HYDROmorphone (DILAUDID) injection, metoprolol tartrate, ondansetron (ZOFRAN) IV  Exam: General: Awake, eyes open. 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 Nonverbal but awake now and follows occ command.  3  ESRD- Continue MWF schedule, keep SBP > 110 4  L thigh soft-tissue abscess, sp I&D x 2, on Vanc/Zosyn. 5  ID possible veg on TTE, not seen on TEE; plan 4 wks empiric abx per ID 6  HTN cont MTP bid 7  Anemia of CKD - max darbe 200 / wk, no IV Fe given infection. Will transfuse w/ next HD Monday 8  MBD w/ hyperCa++ - adj Ca still 11- 11.5, prob immobility. Cont nonCa+ binders, no vit D/ vdra.      Plan - as above   Vinson Moselle MD [facility] pager [phone]   06/29/2016, 11:33 AM    Recent Labs Lab 06/23/16 1229  06/26/16 0713 06/27/16 0501 06/28/16 1233  NA 131*  < > 134* 134* 134*  K 4.0  < > 3.8 4.2 4.2  CL 94*  < > 94* 97* 98*  CO2 23  < > 26 27 25   GLUCOSE 433*  < > 64* 167* 173*  BUN 62*  < > 73*  37* 69*  CREATININE 3.34*  < > 2.83* 1.95* 2.96*  CALCIUM 9.0  < > 8.9 8.6* 8.9  PHOS 4.4  --  2.6  --  2.7  < > = values in this interval not displayed.  Recent Labs Lab 06/26/16 0624 06/26/16 0713 06/28/16 1233  AST 17  --   --   ALT 21  --   --   ALKPHOS 152*  --   --   BILITOT 0.6  --   --   PROT 7.6  --   --   ALBUMIN 1.4* 1.1* 1.0*    Recent Labs Lab 06/25/16 0218 06/26/16 0624 06/26/16 0713 06/27/16 0501 06/28/16 1233  WBC 16.3* 15.5* 17.5* 18.9* 19.4*  NEUTROABS 11.6* 10.3*  --   --   --   HGB 6.8* 8.1* 7.2* 7.1* 6.7*  HCT 23.0* 26.4* 23.6* 24.0* 22.4*  MCV 91.6 90.7 90.1 93.4 94.5  PLT 393 389 383 396 417*   Iron/TIBC/Ferritin/ %Sat    Component Value Date/Time   IRON 29 06/24/2016 0224   TIBC 108 (L) 06/24/2016 0224   FERRITIN 4,098 (H)  06/24/2016 0224   IRONPCTSAT 27 06/24/2016 0224

## 2016-06-29 NOTE — Clinical Social Work Note (Signed)
CSW called sister-Christine Gatling again attempting to get in contact with pt's husband.  Pt's husband Osie CheeksRicardo [name] called CSW back and [facility] Husband also gave permission for sister to make choices on his behalf as he is "overwhelmed" right now. CSW [facility] and [facility] are able to [facility] today. Revonda Standardllison to order wound vac for pt.  Per MD, pt needs further medical consult and will not discharge today. [facility] CSW will continue to follow and [facility] medically [facility].  Corlis HoveJeneya Lennis Korb, LCSWA, LCASA  Clinical Social Work [phone]

## 2016-06-30 ENCOUNTER — Inpatient Hospital Stay (HOSPITAL_COMMUNITY): Payer: Medicaid Other

## 2016-06-30 DIAGNOSIS — I63512 Cerebral infarction due to unspecified occlusion or stenosis of left middle cerebral artery: Secondary | ICD-10-CM

## 2016-06-30 DIAGNOSIS — I6521 Occlusion and stenosis of right carotid artery: Secondary | ICD-10-CM

## 2016-06-30 DIAGNOSIS — R4182 Altered mental status, unspecified: Secondary | ICD-10-CM

## 2016-06-30 DIAGNOSIS — R1310 Dysphagia, unspecified: Secondary | ICD-10-CM

## 2016-06-30 LAB — CBC WITH DIFFERENTIAL/PLATELET
Basophils Absolute: 0 10*3/uL (ref 0.0–0.1)
Basophils Relative: 0 %
Eosinophils Absolute: 0.3 10*3/uL (ref 0.0–0.7)
Eosinophils Relative: 2 %
HCT: 28.6 % — ABNORMAL LOW (ref 36.0–46.0)
Hemoglobin: 8.8 g/dL — ABNORMAL LOW (ref 12.0–15.0)
Lymphocytes Relative: 20 %
Lymphs Abs: 2.5 10*3/uL (ref 0.7–4.0)
MCH: 28.5 pg (ref 26.0–34.0)
MCHC: 30.8 g/dL (ref 30.0–36.0)
MCV: 92.6 fL (ref 78.0–100.0)
Monocytes Absolute: 1 10*3/uL (ref 0.1–1.0)
Monocytes Relative: 8 %
Neutro Abs: 8.7 10*3/uL — ABNORMAL HIGH (ref 1.7–7.7)
Neutrophils Relative %: 70 %
Platelets: 457 10*3/uL — ABNORMAL HIGH (ref 150–400)
RBC: 3.09 MIL/uL — ABNORMAL LOW (ref 3.87–5.11)
RDW: 18.7 % — ABNORMAL HIGH (ref 11.5–15.5)
WBC: 12.4 10*3/uL — ABNORMAL HIGH (ref 4.0–10.5)

## 2016-06-30 LAB — GLUCOSE, CAPILLARY
GLUCOSE-CAPILLARY: 107 mg/dL — AB (ref 65–99)
GLUCOSE-CAPILLARY: 198 mg/dL — AB (ref 65–99)
Glucose-Capillary: 178 mg/dL — ABNORMAL HIGH (ref 65–99)
Glucose-Capillary: 195 mg/dL — ABNORMAL HIGH (ref 65–99)
Glucose-Capillary: 227 mg/dL — ABNORMAL HIGH (ref 65–99)
Glucose-Capillary: 227 mg/dL — ABNORMAL HIGH (ref 65–99)

## 2016-06-30 LAB — CULTURE, BLOOD (ROUTINE X 2)
CULTURE: NO GROWTH
Culture: NO GROWTH
SPECIAL REQUESTS: ADEQUATE
SPECIAL REQUESTS: ADEQUATE

## 2016-06-30 MED ORDER — "THROMBI-PAD 3""X3"" EX PADS"
2.0000 | MEDICATED_PAD | Freq: Once | CUTANEOUS | Status: AC
  Administered 2016-06-30: 2 via TOPICAL
  Filled 2016-06-30: qty 2

## 2016-06-30 MED ORDER — "THROMBI-PAD 3""X3"" EX PADS"
4.0000 | MEDICATED_PAD | Freq: Once | CUTANEOUS | Status: AC
  Administered 2016-06-30: 4 via TOPICAL
  Filled 2016-06-30: qty 4

## 2016-06-30 MED ORDER — "THROMBI-PAD 3""X3"" EX PADS"
1.0000 | MEDICATED_PAD | Freq: Once | CUTANEOUS | Status: DC
  Administered 2016-06-30: 1 via TOPICAL
  Filled 2016-06-30: qty 1

## 2016-06-30 NOTE — Progress Notes (Signed)
STROKE TEAM PROGRESS NOTE   HISTORY OF PRESENT ILLNESS (per record) (from consult 06/08/2016) [name] is an 41 y.o. female who presented for further assessment of mental status changes occurring after dialysis on Friday. Per EMS, she became unresponsive at the end of her treatment. She arrived to the ED with a GCS of 11 and a CBG of 188. The personnel at the dialysis center were unsure of when she was last known normal.   In the ED her WBC count was noted to be 20. She had no fever but was mildly tachycardic and tachypneic. Ammonia level was normal. ABG and clinical parameters showed no indication for ventilatory support. CT head showed no acute changes, with atrophy and chronic infarctions noted.   MRI/MRA of the head was obtained in the ED, revealing a cluster of 3 small acute ischemic infarctions involving the deep grey nuclei and posterior hippocampus on the right. MRA showed slow flow versus occlusion of the right MCA which was felt to be chronic given prior CT findings. 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 The left M1 segment was noted to be chronically stenotic.   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 Also had a recent admission from 3/21 - 4/20 for encephalopathy.   LSN: Unknown tPA Given: No: Not a tPA candidate given no known time of neurological symptom onset   SUBJECTIVE (INTERVAL HISTORY) No family is at the bedside.  Pt is being taken to MRI. Neurology was called back on this patient due to continued AMS. Pt nonverbal, moaning intermittently, not following commands, afebrile.    OBJECTIVE Temp:  [98 F (36.7 C)-99 F (37.2 C)] 98.6 F (37 C) (05/20 0500) Pulse Rate:  [99-107] 107 (05/20 0500) Cardiac Rhythm: Sinus  tachycardia (05/19 1900) Resp:  [18-20] 20 (05/20 0500) BP: (113-167)/(62-101) 130/74 (05/20 0500) SpO2:  [94 %-100 %] 100 % (05/20 0500) Weight:  [104 kg (229 lb 4.5 oz)] 104 kg (229 lb 4.5 oz) (05/20 0500)  CBC:  Recent Labs Lab 06/25/16 0218 06/26/16 0624  06/28/16 1233 06/29/16 0946  WBC 16.3* 15.5*  < > 19.4* 14.5*  NEUTROABS 11.6* 10.3*  --   --   --   HGB 6.8* 8.1*  < > 6.7* 10.4*  HCT 23.0* 26.4*  < > 22.4* 32.6*  MCV 91.6 90.7  < > 94.5 91.6  PLT 393 389  < > 417* 386  < > = values in this interval not displayed.  Basic Metabolic Panel:  Recent Labs Lab 06/26/16 0713 06/27/16 0501 06/28/16 1233  NA 134* 134* 134*  K 3.8 4.2 4.2  CL 94* 97* 98*  CO2 26 27 25   GLUCOSE 64* 167* 173*  BUN 73* 37* 69*  CREATININE 2.83* 1.95* 2.96*  CALCIUM 8.9 8.6* 8.9  PHOS 2.6  --  2.7    Lipid Panel:    Component Value Date/Time   CHOL 160 06/08/2016 0448   TRIG 146 06/08/2016 0448   HDL 45 06/08/2016 0448   CHOLHDL 3.6 06/08/2016 0448   VLDL 29 06/08/2016 0448   LDLCALC 86 06/08/2016 0448   HgbA1c:  Lab Results  Component Value Date   HGBA1C 6.7 (H) 06/09/2016   Urine Drug Screen:    Component Value Date/Time   LABOPIA NEGATIVE 09/05/2013 1822  LABOPIA NEG 08/26/2012 1251   COCAINSCRNUR NEGATIVE 09/05/2013 1822   LABBENZ NEGATIVE 09/05/2013 1822   AMPHETMU NEGATIVE 09/05/2013 1822    Alcohol Level     Component Value Date/Time   ETH <5 05/20/2016 1735    IMAGING I have personally reviewed the radiological images below and agree with the radiology interpretations.  Ct Head Wo Contrast 06/18/2016 No acute abnormality. Age advanced atrophy and multiple bilateral infarcts as seen on prior exams. Atherosclerosis.   Ct Head Wo Contrast 05/27/2016 1. No acute intracranial abnormality identified.  2. Stable chronic basal ganglia and cerebellar hemisphere lacunar infarcts.   Mr Maxine GlennMra Head Wo Contrast 05/18/2016  MRI HEAD:  Motion degraded examination. Acute  subcentimeter RIGHT deep gray nuclei and RIGHT hippocampal infarcts. Multiple old infarcts. Moderate parenchymal brain volume loss for age.   MRA HEAD:  Moderately motion degraded examination. Slow flow versus occluded MCA, likely chronic given prior CT findings. Chronically occluded RIGHT internal carotid artery, and LEFT vertebral artery. Chronically stenotic proximal LEFT M1 segment, difficult to quantify. 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   MRI brain pending   PHYSICAL EXAM  Temp:  [98 F (36.7 C)-99 F (37.2 C)] 98.2 F (36.8 C) (05/20 1000) Pulse Rate:  [91-107] 91 (05/20 1000) Resp:  [18-20] 18 (05/20 1000) BP: (119-167)/(62-101) 121/72 (05/20 1000) SpO2:  [94 %-100 %] 100 % (05/20 1000) Weight:  [229 lb 4.5 oz (104 kg)] 229 lb 4.5 oz (104 kg) (05/20 0500)  General - morbid obesity, well developed, nonverbal, not following commands.  Ophthalmologic - Fundi not visualized due to noncooperation.  Cardiovascular - Regular rate and rhythm.  Neuro - eyes open, nonverbal, intermittent moaning, not following commands, able to track to the right but very slow in action. Blinking to visual threat on the right slowly, but not on the left, eyes right gaze preference, slight disconjugate eyes. PERRL, positive corneal and gag. Spontaneous movement of right elbow, but not right fingers or shoulder even with pain stimulation, 0/5 RLE, and increased muscle tone on the RUE and RLE. LUE 0/5 flaccid, LLE 2/5 on pain stimulation, decreased muscle tone. Sensation, coordination and gait not tested.  ASSESSMENT/PLAN [name] is a 41 y.o. female with history of diabetes mellitus, previous stroke, morbid obesity, hypertension, congestive heart failure, end-stage renal disease, and acute subcentimeter RIGHT deep gray nuclei and RIGHT hippocampal infarcts 05/28/2016  presenting with unresponsiveness. She did not receive IV t-PA due to unknown time of onset.  Stroke:   Right MCA and right MCA/PCA infarcts, consistent with new right MCA occlusion which have been progressed from 2015.   Resultant  AMS, left hemiplegia  CT head - 06/10/2016 - No acute intracranial abnormality identified.  MRI head - acute RIGHT deep gray nuclei and RIGHT hippocampal infarcts  MRA head - Slow flow versus occluded right MCA. Chronically occluded RIGHT ICA, and LEFT VA. Chronically stenotic proximal LEFT M1 segment.  MRI repeat pending  Carotid Doppler - Left vertebral artery flow is absent. Rt ICA occluded.  2D Echo - EF 55-60%. Possible vegetation of the left coronary cusp.  TEE - 07/11/2016 - EF 55-60%. No cardiac source of emboli identified. No thrombus no vegetation.  LDL - 86  HgbA1c - 6.7  VTE prophylaxis - subcutaneous heparin  Diet NPO time specified  No antithrombotic prior to admission, now on aspirin 325 mg daily.   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 However at that time, right M2 reconstituted beyond M1 with early moyamoya type changes  However, MRA 05/17/2016 showed right MCA occlusion, progress from 08/2013, causing right MCA and MCA/PCA infarcts  Frequent hypotensive reading (SBP 90s-100s) throughout this admission  Avoid hypotension  Not candidate for any intervention  BP goal 130-160 due to right ICA and MCA  occlusion  Hx of stroke  Old infarct on MRI 08/2013 - b/l BG and b/l cerebellar   Acute infarct on MRI 08/2013 - Multiple small infarcts involving both cerebral hemispheres, corpus callosum, and medulla, consistent with embolic infarcts. - CTA showed right ICA occlusion, right MCA reconstituted at M2 with M1 region moyamoya type changes. - TEE no LAA thrombus, no PFO. LE venous doppler no DVT, LDL 130  Hypertension  Stable - low at times  BP goal 130-160  Hyperlipidemia  Home meds:  Lipitor 20 mg daily resumed in hospital  LDL 86, goal < 70  Continue statin at discharge  Diabetes  HgbA1c 6.6, goal < 7.0  Controlled  SSI  Continue current management  Other Stroke Risk Factors  Obesity, Body mass index is 41.94 kg/m., recommend weight loss, diet and exercise as appropriate   ESRD on HD  Other Active Problems  NPO - on tube feedings.  Anemia  Leukocytosis  LE soft tissue wounds  Agree with Palliative Care consult   Hospital day # 22  Marvel Plan, MD PhD Stroke Neurology 06/30/2016 11:41 AM  To contact Stroke Continuity provider, please refer to [email or web address]. After hours, contact General Neurology

## 2016-06-30 NOTE — Progress Notes (Signed)
[facility] Progress Note   Subjective: nonverbal  Vitals:   06/29/16 2100 06/30/16 0030 06/30/16 0500 06/30/16 1000  BP: (!) 167/101 119/76 130/74 121/72  Pulse: (!) 103 (!) 103 (!) 107 91  Resp: 20 20 20 18   Temp: 98 F (36.7 C) 98 F (36.7 C) 98.6 F (37 C) 98.2 F (36.8 C)  TempSrc: Axillary Axillary Axillary Axillary  SpO2: 94% 100% 100% 100%  Weight:   104 kg (229 lb 4.5 oz)   Height:        Inpatient medications: . aspirin  325 mg Per Tube Daily  . atorvastatin  20 mg Per Tube QPM  . chlorhexidine  15 mL Mouth Rinse BID  . darbepoetin (ARANESP) injection - DIALYSIS  200 mcg Intravenous Q Wed-HD  . feeding supplement (PRO-STAT SUGAR FREE 64)  30 mL Per Tube QID  . FLUoxetine  20 mg Per Tube Daily  . heparin subcutaneous  5,000 Units Subcutaneous Q8H  . insulin aspart  0-20 Units Subcutaneous Q4H  . insulin glargine  35 Units Subcutaneous BID  . metoprolol tartrate  25 mg Per Tube BID  . Zinc Oxide   Topical BID   . sodium chloride    . sodium chloride    . sodium chloride    . feeding supplement (OSMOLITE 1.5 CAL) 1,000 mL (06/30/16 0756)  . sodium chloride     sodium chloride, sodium chloride, [DISCONTINUED] acetaminophen **OR** [DISCONTINUED] acetaminophen (TYLENOL) oral liquid 160 mg/5 mL **OR** acetaminophen, acetaminophen, Gerhardt's butt cream, glucagon (human recombinant), hydrALAZINE, HYDROcodone-acetaminophen, HYDROmorphone (DILAUDID) injection, metoprolol tartrate, ondansetron (ZOFRAN) IV  Exam: General: Awake, eyes open. Turns head to voice Heart: Tachycardic, normal rhythm Lungs: CTA anteriorly Extremities: No LE edema; wound VAC L thigh, other superficial skin discoloration R medial thigh, L outer thigh w purplish/ blackish discoloration No LE edema Neuro - grips on R, tracks w her eyes, nonverbal Dialysis Access: LUE AVG + bruit   Dialysis Orders: MWF [facility]  4.5h   400/800 105.5kg 3K/2Ca LUA AVG Hep 4400 - Mircera  IV q 2 weeks (last given 4/25) - Calcitriol 0.67mcg PO q HD  Assessment: 1  Acute CVA - multifactorial, +chronic R intracranial occlusion and L vertebral occlusion by MRA. Nonverbal but awake 3  ESRD- Continue MWF schedule, keep SBP > 110 4  L thigh soft-tissue abscess, sp I&D x 2, on Vanc/Zosyn. 5  ID possible veg on TTE, not seen on TEE; plan 4 wks empiric abx per ID 6  HTN cont MTP bid 7  Anemia of CKD - max darbe 200 / wk, no IV Fe given infection. 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 Cont nonCa+ binders, no vit D/ vdra.      Plan - as above   Vinson Moselle MD [name] pager [phone]   06/30/2016, 1:10 PM    Recent Labs Lab 06/26/16 0713 06/27/16 0501 06/28/16 1233  NA 134* 134* 134*  K 3.8 4.2 4.2  CL 94* 97* 98*  CO2 26 27 25   GLUCOSE 64* 167* 173*  BUN 73* 37* 69*  CREATININE 2.83* 1.95* 2.96*  CALCIUM 8.9 8.6* 8.9  PHOS 2.6  --  2.7    Recent Labs Lab 06/26/16 0624 06/26/16 0713 06/28/16 1233  AST 17  --   --   ALT 21  --   --   ALKPHOS 152*  --   --   BILITOT 0.6  --   --  PROT 7.6  --   --   ALBUMIN 1.4* 1.1* 1.0*    Recent Labs Lab 06/25/16 0218 06/26/16 0624  06/27/16 0501 06/28/16 1233 06/29/16 0946  WBC 16.3* 15.5*  < > 18.9* 19.4* 14.5*  NEUTROABS 11.6* 10.3*  --   --   --   --   HGB 6.8* 8.1*  < > 7.1* 6.7* 10.4*  HCT 23.0* 26.4*  < > 24.0* 22.4* 32.6*  MCV 91.6 90.7  < > 93.4 94.5 91.6  PLT 393 389  < > 396 417* 386  < > = values in this interval not displayed. Iron/TIBC/Ferritin/ %Sat    Component Value Date/Time   IRON 29 06/24/2016 0224   TIBC 108 (L) 06/24/2016 0224   FERRITIN 4,098 (H) 06/24/2016 0224   IRONPCTSAT 27 06/24/2016 0224

## 2016-06-30 NOTE — Progress Notes (Signed)
PROGRESS NOTE    [name]  ZHY:[id number] DOB: 1975/08/20 DOA: 2016/06/29 PCP: Marletta Lor, NP   Brief Narrative: [name] is a 41 y.o. female with a history of CVA, DM type 2, chronic lymphedema, HTN, chronic diastolic heart failure, depression. She presented with acute altered mental status and found to have new CVA.   Assessment & Plan:   Principal Problem:   CVA (cerebral vascular accident) [facility]) Active Problems:   Morbid obesity (HCC)   Essential hypertension   Acute metabolic encephalopathy   Uncontrolled diabetes mellitus with diabetic nephropathy, with long-term current use of insulin (HCC)   ESRD (end stage renal disease) (HCC)   Depression due to physical illness   Wound abscess   Palliative care encounter   Acute on chronic encephalopathy Per chart review, appears that this may be new baseline per previous evaluation. Possibly secondary to strokes. No evidence of infection at this point. -re-consult stroke team to assess contribution of acute strokes to her mental status  Acute multifocal CVAs History of CVAs Neurology evaluated and signed off -continue aspirin -re-consult stroke team as above  L posterior/medial thigh wound Fevers Afebrile. Blood cultures have been negative. CT without abscess. TEE without evidence of vegetations -repeat blood cultures negative to date  ESRD on HD Dialysis MWF  Reactive RPR t pallidum abs negative.  Morbid obesity Body mass index is 41.41 kg/m  Nutrition PEG tube -continue tube feeds -nutrition recommendations  Essential hypertension -continue metoprolol  Diabetes mellitus -continue Lantus and SSI  Chronic diastolic heart failure Last TTE on 4/28 with EF of 55-60% with grade 1 diastolic dysfunction. EDW of 105.5kg  Leukocytosis No evidence of abscess. Unknown source.  Tachycardia Unsure of etiology. EKG significant for sinus tachycardia. -continue metoprolol  Anemia of  chronic disease S/p 2 units of PRBC in dialysis. Hemoglobin stable.   DVT prophylaxis: heparin subq Code Status: Full code Family Communication: None at bedside Disposition Plan: SNF when bed is available.   Consultants:   Palliative care medicine  Neurology  Nephrology  Procedures:   Dialysis MWF  Antimicrobials:  Vancomycin  Cefepime  Zosyn    Subjective: Patient still non-verbal  Objective: Vitals:   06/29/16 2100 06/30/16 0030 06/30/16 0500 06/30/16 1000  BP: (!) 167/101 119/76 130/74 121/72  Pulse: (!) 103 (!) 103 (!) 107 91  Resp: 20 20 20 18   Temp: 98 F (36.7 C) 98 F (36.7 C) 98.6 F (37 C) 98.2 F (36.8 C)  TempSrc: Axillary Axillary Axillary Axillary  SpO2: 94% 100% 100% 100%  Weight:   104 kg (229 lb 4.5 oz)   Height:        Intake/Output Summary (Last 24 hours) at 06/30/16 1125 Last data filed at 06/29/16 2048  Gross per 24 hour  Intake             1000 ml  Output                0 ml  Net             1000 ml   Filed Weights   06/28/16 1238 06/28/16 1640 06/30/16 0500  Weight: 102 kg (224 lb 13.9 oz) 100.5 kg (221 lb 9 oz) 104 kg (229 lb 4.5 oz)    Examination:  General exam: Appears calm and comfortable Respiratory system: Clear to auscultation. Respiratory effort normal. Cardiovascular system: S1 & S2 heard, elevated rate, normal rhythm. No murmurs. Gastrointestinal system: Abdomen is nondistended, soft and nontender. Normal bowel  sounds heard. Central nervous system: Alert and unable to assess orientation. Extremities: No edema. No calf tenderness. Induration of left lateral thigh Skin: No cyanosis. No rashes. No cellulitis. Psychiatry: not able to assess  Exam unchanged from 06/29/16    Data Reviewed: I have personally reviewed following labs and imaging studies  CBC:  Recent Labs Lab 06/25/16 0218 06/26/16 0624 06/26/16 0713 06/27/16 0501 06/28/16 1233 06/29/16 0946  WBC 16.3* 15.5* 17.5* 18.9* 19.4* 14.5*    NEUTROABS 11.6* 10.3*  --   --   --   --   HGB 6.8* 8.1* 7.2* 7.1* 6.7* 10.4*  HCT 23.0* 26.4* 23.6* 24.0* 22.4* 32.6*  MCV 91.6 90.7 90.1 93.4 94.5 91.6  PLT 393 389 383 396 417* 386   Basic Metabolic Panel:  Recent Labs Lab 06/23/16 1229  06/25/16 0218 06/26/16 0624 06/26/16 0713 06/27/16 0501 06/28/16 1233  NA 131*  < > 134* 135 134* 134* 134*  K 4.0  < > 3.9 3.9 3.8 4.2 4.2  CL 94*  < > 94* 95* 94* 97* 98*  CO2 23  < > 28 25 26 27 25   GLUCOSE 433*  < > 173* 63* 64* 167* 173*  BUN 62*  < > 40* 73* 73* 37* 69*  CREATININE 3.34*  < > 1.97* 2.83* 2.83* 1.95* 2.96*  CALCIUM 9.0  < > 8.5* 9.1 8.9 8.6* 8.9  PHOS 4.4  --   --   --  2.6  --  2.7  < > = values in this interval not displayed. GFR: Estimated Creatinine Clearance: 28.9 mL/min (A) (by C-G formula based on SCr of 2.96 mg/dL (H)). Liver Function Tests:  Recent Labs Lab 06/23/16 1229 06/26/16 0624 06/26/16 0713 06/28/16 1233  AST  --  17  --   --   ALT  --  21  --   --   ALKPHOS  --  152*  --   --   BILITOT  --  0.6  --   --   PROT  --  7.6  --   --   ALBUMIN 1.4* 1.4* 1.1* 1.0*   No results for input(s): LIPASE, AMYLASE in the last 168 hours. No results for input(s): AMMONIA in the last 168 hours. Coagulation Profile: No results for input(s): INR, PROTIME in the last 168 hours. Cardiac Enzymes: No results for input(s): CKTOTAL, CKMB, CKMBINDEX, TROPONINI in the last 168 hours. BNP (last 3 results) No results for input(s): PROBNP in the last 8760 hours. HbA1C: No results for input(s): HGBA1C in the last 72 hours. CBG:  Recent Labs Lab 06/29/16 1641 06/29/16 2021 06/30/16 0029 06/30/16 0503 06/30/16 0805  GLUCAP 142* 84 107* 178* 195*   Lipid Profile: No results for input(s): CHOL, HDL, LDLCALC, TRIG, CHOLHDL, LDLDIRECT in the last 72 hours. Thyroid Function Tests: No results for input(s): TSH, T4TOTAL, FREET4, T3FREE, THYROIDAB in the last 72 hours. Anemia Panel: No results for input(s):  VITAMINB12, FOLATE, FERRITIN, TIBC, IRON, RETICCTPCT in the last 72 hours. Sepsis Labs: No results for input(s): PROCALCITON, LATICACIDVEN in the last 168 hours.  Recent Results (from the past 240 hour(s))  MRSA PCR Screening     Status: None   Collection Time: 06/25/16  2:26 AM  Result Value Ref Range Status   MRSA by PCR NEGATIVE NEGATIVE Final    Comment:        The GeneXpert MRSA Assay (FDA approved for NASAL specimens only), is one component of a comprehensive MRSA colonization surveillance program.  It is not intended to diagnose MRSA infection nor to guide or monitor treatment for MRSA infections.   Culture, blood (routine x 2)     Status: None (Preliminary result)   Collection Time: 06/25/16  7:02 PM  Result Value Ref Range Status   Specimen Description BLOOD RIGHT HAND  Final   Special Requests IN PEDIATRIC BOTTLE Blood Culture adequate volume  Final   Culture NO GROWTH 4 DAYS  Final   Report Status PENDING  Incomplete  Culture, blood (routine x 2)     Status: None (Preliminary result)   Collection Time: 06/25/16  7:40 PM  Result Value Ref Range Status   Specimen Description BLOOD RIGHT HAND  Final   Special Requests IN PEDIATRIC BOTTLE Blood Culture adequate volume  Final   Culture NO GROWTH 4 DAYS  Final   Report Status PENDING  Incomplete  Culture, blood (routine x 2)     Status: None (Preliminary result)   Collection Time: 06/27/16  1:11 PM  Result Value Ref Range Status   Specimen Description BLOOD RIGHT HAND  Final   Special Requests   Final    BOTTLES DRAWN AEROBIC ONLY Blood Culture results may not be optimal due to an inadequate volume of blood received in culture bottles   Culture NO GROWTH 2 DAYS  Final   Report Status PENDING  Incomplete  Culture, blood (routine x 2)     Status: None (Preliminary result)   Collection Time: 06/27/16  2:28 PM  Result Value Ref Range Status   Specimen Description BLOOD RIGHT WRIST  Final   Special Requests IN PEDIATRIC  BOTTLE Blood Culture adequate volume  Final   Culture NO GROWTH 2 DAYS  Final   Report Status PENDING  Incomplete         Radiology Studies: No results found.      Scheduled Meds: . aspirin  325 mg Per Tube Daily  . atorvastatin  20 mg Per Tube QPM  . chlorhexidine  15 mL Mouth Rinse BID  . darbepoetin (ARANESP) injection - DIALYSIS  200 mcg Intravenous Q Wed-HD  . feeding supplement (PRO-STAT SUGAR FREE 64)  30 mL Per Tube QID  . FLUoxetine  20 mg Per Tube Daily  . heparin subcutaneous  5,000 Units Subcutaneous Q8H  . insulin aspart  0-20 Units Subcutaneous Q4H  . insulin glargine  35 Units Subcutaneous BID  . metoprolol tartrate  25 mg Per Tube BID  . Zinc Oxide   Topical BID   Continuous Infusions: . sodium chloride    . sodium chloride    . sodium chloride    . feeding supplement (OSMOLITE 1.5 CAL) 1,000 mL (06/30/16 0756)  . sodium chloride       LOS: 22 days     Jacquelin Hawking, MD [facility] 06/30/2016, 11:25 AM Pager: [phone]  If 7PM-7AM, please contact night-coverage [email or web address] Password [email or web address] 06/30/2016, 11:25 AM

## 2016-06-30 NOTE — Progress Notes (Signed)
Wound vac dsg to LLE removed after fecal matter contamination. This RN unable to stop slow bleed from wound bed after pressure applied. Tama GanderKatherine Schorr, NP, oncall provider notified. Advised to apply thrombi-pad to wound bed with pressure dressing. 3x3 thrombi pads x 2 applied with pressure dressing by this RN and Alan Ripperlaire, RN night-shift nurse, patient tol well. No continued bleeding noted at this time.

## 2016-06-30 NOTE — Progress Notes (Signed)
Patient  thrombi pad dsg to LLE saturated. NP Tama GanderKatherine Schorr notified orders given to redress wound. Nursing staff will carry out orders and continue with care.

## 2016-07-01 DIAGNOSIS — I63511 Cerebral infarction due to unspecified occlusion or stenosis of right middle cerebral artery: Secondary | ICD-10-CM

## 2016-07-01 DIAGNOSIS — I63512 Cerebral infarction due to unspecified occlusion or stenosis of left middle cerebral artery: Principal | ICD-10-CM

## 2016-07-01 DIAGNOSIS — R401 Stupor: Secondary | ICD-10-CM

## 2016-07-01 LAB — RENAL FUNCTION PANEL
ANION GAP: 11 (ref 5–15)
Albumin: 1.1 g/dL — ABNORMAL LOW (ref 3.5–5.0)
Anion gap: 14 (ref 5–15)
BUN: 87 mg/dL — AB (ref 6–20)
BUN: 88 mg/dL — ABNORMAL HIGH (ref 6–20)
CALCIUM: 8.7 mg/dL — AB (ref 8.9–10.3)
CO2: 24 mmol/L (ref 22–32)
CO2: 25 mmol/L (ref 22–32)
CREATININE: 2.95 mg/dL — AB (ref 0.44–1.00)
CREATININE: 3 mg/dL — AB (ref 0.44–1.00)
Calcium: 9 mg/dL (ref 8.9–10.3)
Chloride: 94 mmol/L — ABNORMAL LOW (ref 101–111)
Chloride: 94 mmol/L — ABNORMAL LOW (ref 101–111)
GFR calc non Af Amer: 19 mL/min — ABNORMAL LOW (ref >60)
GFR, EST AFRICAN AMERICAN: 22 mL/min — AB (ref >60)
GFR, EST AFRICAN AMERICAN: 21 mL/min — AB (ref >60)
GFR, EST NON AFRICAN AMERICAN: 19 mL/min — AB (ref >60)
Glucose, Bld: 281 mg/dL — ABNORMAL HIGH (ref 65–99)
Glucose, Bld: 307 mg/dL — ABNORMAL HIGH (ref 65–99)
PHOSPHORUS: 2.9 mg/dL (ref 2.5–4.6)
PHOSPHORUS: 2.8 mg/dL (ref 2.5–4.6)
Potassium: 4.7 mmol/L (ref 3.5–5.1)
Potassium: 4 mmol/L (ref 3.5–5.1)
SODIUM: 130 mmol/L — AB (ref 135–145)
Sodium: 132 mmol/L — ABNORMAL LOW (ref 135–145)

## 2016-07-01 LAB — CBC
HCT: 23.5 % — ABNORMAL LOW (ref 36.0–46.0)
Hemoglobin: 7.1 g/dL — ABNORMAL LOW (ref 12.0–15.0)
MCH: 27.7 pg (ref 26.0–34.0)
MCHC: 30.2 g/dL (ref 30.0–36.0)
MCV: 91.8 fL (ref 78.0–100.0)
Platelets: 432 10*3/uL — ABNORMAL HIGH (ref 150–400)
RBC: 2.56 MIL/uL — ABNORMAL LOW (ref 3.87–5.11)
RDW: 18.3 % — ABNORMAL HIGH (ref 11.5–15.5)
WBC: 16.2 10*3/uL — ABNORMAL HIGH (ref 4.0–10.5)

## 2016-07-01 LAB — GLUCOSE, CAPILLARY
GLUCOSE-CAPILLARY: 284 mg/dL — AB (ref 65–99)
GLUCOSE-CAPILLARY: 312 mg/dL — AB (ref 65–99)
Glucose-Capillary: 203 mg/dL — ABNORMAL HIGH (ref 65–99)
Glucose-Capillary: 254 mg/dL — ABNORMAL HIGH (ref 65–99)
Glucose-Capillary: 202 mg/dL — ABNORMAL HIGH (ref 65–99)
Glucose-Capillary: 272 mg/dL — ABNORMAL HIGH (ref 65–99)

## 2016-07-01 LAB — HEMOGLOBIN AND HEMATOCRIT, BLOOD
HCT: 28.2 % — ABNORMAL LOW (ref 36.0–46.0)
Hemoglobin: 8.6 g/dL — ABNORMAL LOW (ref 12.0–15.0)

## 2016-07-01 LAB — PREPARE RBC (CROSSMATCH)

## 2016-07-01 MED ORDER — HEPARIN SODIUM (PORCINE) 1000 UNIT/ML DIALYSIS
2000.0000 [IU] | INTRAMUSCULAR | Status: DC | PRN
  Filled 2016-07-01: qty 2

## 2016-07-01 MED ORDER — SODIUM CHLORIDE 0.9 % IV SOLN: 100.0000 mL | INTRAVENOUS | Status: DC | PRN

## 2016-07-01 MED ORDER — COLLAGENASE 250 UNIT/GM EX OINT
TOPICAL_OINTMENT | Freq: Every day | CUTANEOUS | Status: DC
  Administered 2016-07-02 – 2016-07-13 (×11): via TOPICAL
  Administered 2016-07-14 – 2016-07-15 (×2): 1 via TOPICAL
  Filled 2016-07-01 (×3): qty 30

## 2016-07-01 MED ORDER — ALTEPLASE 2 MG IJ SOLR
2.0000 mg | Freq: Once | INTRAMUSCULAR | Status: DC | PRN
  Filled 2016-07-01: qty 2

## 2016-07-01 MED ORDER — SODIUM CHLORIDE 0.9 % IV SOLN: Freq: Once | INTRAVENOUS | Status: DC

## 2016-07-01 MED ORDER — HEPARIN SODIUM (PORCINE) 1000 UNIT/ML DIALYSIS
1000.0000 [IU] | INTRAMUSCULAR | Status: DC | PRN
  Filled 2016-07-01: qty 1

## 2016-07-01 MED ORDER — LIDOCAINE HCL (PF) 1 % IJ SOLN: 5.0000 mL | INTRAMUSCULAR | Status: DC | PRN

## 2016-07-01 MED ORDER — PENTAFLUOROPROP-TETRAFLUOROETH EX AERO: 1.0000 "application " | INHALATION_SPRAY | CUTANEOUS | Status: DC | PRN

## 2016-07-01 MED ORDER — LIDOCAINE-PRILOCAINE 2.5-2.5 % EX CREA: 1.0000 "application " | TOPICAL_CREAM | CUTANEOUS | Status: DC | PRN

## 2016-07-01 MED ORDER — HYDROCODONE-ACETAMINOPHEN 5-325 MG PO TABS
ORAL_TABLET | ORAL | Status: AC
  Administered 2016-07-01: 2 via ORAL
  Filled 2016-07-01: qty 2

## 2016-07-01 NOTE — Progress Notes (Signed)
[facility] Progress Note   Subjective:  Completely non-verbal in dialysis Issues with bleeding from thigh wound over the weekend  Vitals:   06/30/16 2122 06/30/16 2240 07/01/16 0123 07/01/16 0538  BP: (!) 111/41 132/74 (!) 124/40 115/65  Pulse: 85 99 95 98  Resp: 16  18 18   Temp: 97.9 F (36.6 C)  97.6 F (36.4 C) 97.6 F (36.4 C)  TempSrc: Axillary  Axillary Axillary  SpO2: 99%  100% 100%  Weight:      Height:       Exam: Not awake in HD today though eyes open. Turns head to voice minimally Dies not follow any commands Tachy 110 Other VS as noted Anteriorly lungs clear Abd with G-tube in place No LE edema; wound VAC L thigh, other superficial skin discoloration R medial thigh,  L outer thigh w purplish/ blackish discoloration with bulky dressing in place Not tracking or following any commands Dialysis Access: LUE AVG currently cannulated for HD  Inpatient medications: . aspirin  325 mg Per Tube Daily  . atorvastatin  20 mg Per Tube QPM  . chlorhexidine  15 mL Mouth Rinse BID  . darbepoetin (ARANESP) injection - DIALYSIS  200 mcg Intravenous Q Wed-HD  . feeding supplement (PRO-STAT SUGAR FREE 64)  30 mL Per Tube QID  . FLUoxetine  20 mg Per Tube Daily  . heparin subcutaneous  5,000 Units Subcutaneous Q8H  . insulin aspart  0-20 Units Subcutaneous Q4H  . insulin glargine  35 Units Subcutaneous BID  . metoprolol tartrate  25 mg Per Tube BID  . Zinc Oxide   Topical BID   . sodium chloride    . sodium chloride    . sodium chloride    . sodium chloride    . sodium chloride    . feeding supplement (OSMOLITE 1.5 CAL) 1,000 mL (06/30/16 1741)  . sodium chloride     sodium chloride, sodium chloride, sodium chloride, sodium chloride, [DISCONTINUED] acetaminophen **OR** [DISCONTINUED] acetaminophen (TYLENOL) oral liquid 160 mg/5 mL **OR** acetaminophen, acetaminophen, alteplase, Gerhardt's butt cream, glucagon (human recombinant), heparin, [START ON  07/02/2016] heparin, hydrALAZINE, HYDROcodone-acetaminophen, HYDROmorphone (DILAUDID) injection, lidocaine (PF), lidocaine-prilocaine, metoprolol tartrate, ondansetron (ZOFRAN) IV, pentafluoroprop-tetrafluoroeth    Recent Labs Lab 06/28/16 1233 07/01/16 0609 07/01/16 0729  NA 134* 132* 130*  K 4.2 4.7 4.0  CL 98* 94* 94*  CO2 25 24 25   GLUCOSE 173* 281* 307*  BUN 69* 87* 88*  CREATININE 2.96* 2.95* 3.00*  CALCIUM 8.9 9.0 8.7*  PHOS 2.7 2.9 2.8    Recent Labs Lab 06/26/16 0624  06/28/16 1233 07/01/16 0609 07/01/16 0729  AST 17  --   --   --   --   ALT 21  --   --   --   --   ALKPHOS 152*  --   --   --   --   BILITOT 0.6  --   --   --   --   PROT 7.6  --   --   --   --   ALBUMIN 1.4*  < > 1.0* 1.1* <1.0*  < > = values in this interval not displayed.  Recent Labs Lab 06/25/16 0218 06/26/16 0624  06/29/16 0946 06/30/16 2322 07/01/16 0728  WBC 16.3* 15.5*  < > 14.5* 12.4* 16.2*  NEUTROABS 11.6* 10.3*  --   --  8.7*  --   HGB 6.8* 8.1*  < > 10.4* 8.8* 7.1*  HCT 23.0* 26.4*  < >  32.6* 28.6* 23.5*  MCV 91.6 90.7  < > 91.6 92.6 91.8  PLT 393 389  < > 386 457* 432*  < > = values in this interval not displayed.      Component Value Date/Time   IRON 29 06/24/2016 0224   TIBC 108 (L) 06/24/2016 0224   FERRITIN 4,098 (H) 06/24/2016 0224   IRONPCTSAT 27 06/24/2016 0224   Dialysis Orders: MWF [facility]  4.5h   400/800 105.5kg 3K/2Ca LUA AVG Hep 4400 - Mircera 150mcg IV q 2 weeks (last given 4/25) - Calcitriol 0.6225mcg PO q HD  Assessment/Recommendations:  1. 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 Nonverbal. Not really awake in HD either though eyes open. No participation in PT at all. Would not be able to return to outpt HD in current state. Need for neuro to make statement if possible about prognosis - what kind of functional recovery is expected? Dr. Caleb PoppNettey has called them and repeat MRI  is planned 2. ESRD- Continue MWF schedule, keep SBP > 110. In her current state cannot go back to the outpt dialysis setting and would require LTAC. Would be unable to sit in a chair for HD. Stretcher dialysis not an option. Will need (in my opinion) Palliative Care approach unless some dramatic recovery is expected. 3. L post medial thigh soft-tissue wound, sp I&D x 2, was on Vanc/Zosyn which have been stopped. Bleeding from wound over the weekend.  4. ID possible veg on TTE, not seen on TEE.  5. HTN cont MTP bid 6. Anemia of CKD - max darbe 200 / wk, no IV Fe given infection. Transfuse with HD today for Hb 7.1 7. MBD w/ hyperCa++ >> adjusted Ca still 11- 11.5. Cont nonCa+ binders, no vit D/ vdra.    8. HLD 9. DM - [per primary care team 10. MO 11. Malnutrition - albumin <1. Getting TF's.   Camille Balynthia Lerlene Treadwell, MD [facility] [phone] Pager 07/01/2016, 8:43 AM

## 2016-07-01 NOTE — Progress Notes (Signed)
Occupational Therapy Treatment Patient Details Name: [name] MRN: [id number] DOB: [birth year] Today's Date: 07/01/2016    History of present illness Patient is a 41 yo female admitted 05/25/2016 with AMS from Dialysis Center.  Patient with Rt infarcts, occluded MCA, Rt ICA, and Lt vertebral artery. No verbalizations or voluntary movement.   PMH:  CVA with RLE weak, DM, obesity, chronic lymphedema, wound Rt calf, VAC on Lt thigh, HTN, CHF, depression, ESRD on HD, asthma   OT comments  Pt remains limited by lethargy and demonstrates significant difficulty engaging in ADL. Pt appeared to turn head to auditory stimuli 1/5 attempts and demonstrated ability to minimally visually track with L eye. She did keep her eyes open for greater than 75% of session this date. Pt with tightly fisted R hand and provided towel roll for improved functional positioning. She did demonstrate pain response to B noxious stimuli in her feet. Plan to assess response to auditory and visual stimuli next session in preparation for improved engagement in ADL. Noted pending MRI and palliative consult. Will continue to follow as appropriate while admitted.    Follow Up Recommendations  SNF;Supervision/Assistance - 24 hour    Equipment Recommendations  Other (comment) (TBD at next venue of care)    Recommendations for Other Services      Precautions / Restrictions Precautions Precautions: Fall Precaution Comments: wound vac to L inner thigh; flexiseal (removed due to ) Restrictions Weight Bearing Restrictions: No       Mobility Bed Mobility               General bed mobility comments: Session focused on ADL sitting up in bed.   Transfers                      Balance                                           ADL either performed or assessed with clinical judgement   ADL Overall ADL's : Needs assistance/impaired                                        General ADL Comments: Pt remains total A for all basic ADLs     Vision   Additional Comments: Pt able to track minimally with L eye to the L 1/3 attempts.   Perception     Praxis      Cognition Arousal/Alertness: Lethargic Behavior During Therapy: Flat affect Overall Cognitive Status: Difficult to assess Area of Impairment: Following commands;Awareness                       Following Commands: Follows one step commands inconsistently   Awareness:  (pre-intellectual)   General Comments: Pt not verbalizing or following commands this session. Appeared to turn head to her name 1/5 times and visually track minimally during session.         Exercises     Shoulder Instructions       General Comments      Pertinent Vitals/ Pain       Pain Assessment: Faces Faces Pain Scale: Hurts a little bit Pain Location: generalized with movement Pain Descriptors / Indicators: Moaning Pain Intervention(s): Limited activity within patient's tolerance;Repositioned;Monitored during session  Home Living                                          Prior Functioning/Environment              Frequency  Min 2X/week        Progress Toward Goals  OT Goals(current goals can now be found in the care plan section)  Progress towards OT goals: Not progressing toward goals - comment (continued lethargy and decreased following commands)  Acute Rehab OT Goals Patient Stated Goal: none stated OT Goal Formulation: With patient Time For Goal Achievement: 06/24/16 Potential to Achieve Goals: Fair ADL Goals Additional ADL Goal #1: Pt will maintain eye opening 20% of session Additional ADL Goal #2: Pt will follow one 1 step command each session with multi-modal cues Additional ADL Goal #3: Pt will be able to track object Additional ADL Goal #4: Pt will tolerate upright sitting 15 minutes to work on sitting balance  Plan Discharge plan remains appropriate     Co-evaluation                 AM-PAC PT "6 Clicks" Daily Activity     Outcome Measure   Help from another person eating meals?: Total Help from another person taking care of personal grooming?: Total Help from another person toileting, which includes using toliet, bedpan, or urinal?: Total Help from another person bathing (including washing, rinsing, drying)?: Total Help from another person to put on and taking off regular upper body clothing?: Total Help from another person to put on and taking off regular lower body clothing?: Total 6 Click Score: 6    End of Session    OT Visit Diagnosis: Muscle weakness (generalized) (M62.81);Low vision, both eyes (H54.2);Other symptoms and signs involving the nervous system (R29.898);Other symptoms and signs involving cognitive function;Cognitive communication deficit (R41.841);Hemiplegia and hemiparesis Symptoms and signs involving cognitive functions: Cerebral infarction Hemiplegia - Right/Left:  (both it appears) Hemiplegia - dominant/non-dominant:  (both it appears) Hemiplegia - caused by: Cerebral infarction   Activity Tolerance Patient tolerated treatment well;Patient limited by lethargy   Patient Left in bed   Nurse Communication Mobility status        Time: 1407-1430 OT Time Calculation (min): 23 min  Charges: OT General Charges $OT Visit: 1 Procedure OT Treatments $Self Care/Home Management : 23-37 mins  Doristine Sectionharity A Parissa Chiao, MS OTR/L  Pager: [phone]    Esty Ahuja A Garnell Begeman 07/01/2016, 5:38 PM

## 2016-07-01 NOTE — Progress Notes (Signed)
Orders given to redress LLE bleeding. NP Tama GanderKatherine Schorr at bedside to help with thrombi dressing change. No visible saturation so far. Will continue to monitor.

## 2016-07-01 NOTE — Progress Notes (Signed)
Dialysis treatment completed.  1000 mL ultrafiltrated and net fluid removal 1000 mL.    Patient status unchanged. Lung sounds diminished to ausculation in all fields. Generalized edema. Cardiac: NSR to ST.  Disconnected lines and removed needles.  Pressure held for 10 minutes and band aid/gauze dressing applied.  Report given to bedside RN, Fredirick LatheKehindu.  1 unit PRBC given with HD treatment.

## 2016-07-01 NOTE — Progress Notes (Signed)
Patient arrived to floor.

## 2016-07-01 NOTE — Consult Note (Signed)
WOC Nurse wound consult note Reason for Consult: re-consulted to follow up on NPWT dressing to the left inner thigh.  WOC nurse had been following for left upper thigh and sacral/buttock wounds. Noted at the time of her first admission to have a right lower calf wound that is now healed and a right upper distal calf wound that is now healed. During this admission she has developed new black eschar on the right upper medial thigh that is similar in presentation to her other leg wounds.  Noted to have developed a bilateral heel pressure injuries from skin failure and comorbid conditions. Right heel injury has resolved. Continues to have pressure injury to the left heel.   New area today noted on her left distal posterior calf that is necrotic with foul drainage from the amount of necrosis.  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  Chanler skin is failing her and she continues to develop worsening of all of her wound and developing new wounds. 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 Left posterior calf: new; 12.5cm x 7cm x 0.5cm; 100% yellow/black loose necrotic skin with potential tendon exposure; green/yellow purulent drainage from the amount of necrotic tissue Right distal calf; healed;100% pink re-epithelialized Right medial distal thigh: healed 100% re-epithelialized Sacrum;buttock several partial thickness areas of skin loss; MASD (moisture associated skin damage; each aprox 2cm x 2cm x 0.1cm; minimal serosanguinous drainage  Left heel: unstagable pressure injury: 4cm x 5.5cm x 0cm: intact, no drainage. Wound bed: see above Drainage (amount, consistency, odor) see above Periwound:see above Dressing procedure/placement/frequency: Continue LALM (low air loss mattress)  and Prevalon boots for moisture management and pressure redistribution Foam to the left heel and right calf for protection No topical care to the right inner thigh, monitor for changes. NPWT at 125mmHG restarted to the left upper thigh wound, CCS at bedside, agrees with restarting therapy PT for hydrotherapy to the left posterior calf wound with enzymatic debridement.  May need ortho evaluation if tendon exposure Foam to the sacrum and barrier cream to the buttocks for MASD, FMS in place for management of bowel incontinence.  Nutrition maximization per RD, PEG tube feedings.  She remains neurologically unchanged today during my assessment and during her dressing changes.  I do note that with dressing removal she will move her leg and with reapplication of the NPWT dressing she will slightly withdraw her leg.    WOC nurse will follow along with you for complex wound care needs.  I have notified hospitalist today of the new wound on the left calf and the possible need for orthopedic evaluation if worsens and tendon exposure becomes an issue.   Antinio Sanderfer [facility] MSN,RN,CWOCN, CNS [phone]

## 2016-07-01 NOTE — Progress Notes (Addendum)
PROGRESS NOTE    [name]  XLK:[id number] DOB: 05-May-1975 DOA: 05/12/2016 PCP: Marletta Lor, NP   Brief Narrative: [name] is a 41 y.o. female with a history of CVA, DM type 2, chronic lymphedema, HTN, chronic diastolic heart failure, depression. She presented with acute altered mental status and found to have new CVA.   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 Possibly secondary to strokes. No evidence of infection at this point. -neurology recommendations: repeat MRI to assess for new stroke  Acute multifocal CVAs History of CVAs Neurology evaluated and signed off initially. Re-consulted on 06/30/16 -continue aspirin -neurology recommendations -re-consult palliative care pending results of MRI  L posterior/medial thigh wound Fevers Afebrile. Blood cultures have been negative. Repeat CT without abscess. TEE without evidence of vegetations. Increased bleeding overnight. Wound care to assess. Wound vac taken off. -repeat blood cultures negative to date  ESRD on HD Dialysis MWF  Reactive RPR t pallidum abs negative.  Morbid obesity Body mass index is 41.41 kg/m  Nutrition PEG tube -continue tube feeds -nutrition recommendations  Essential hypertension -continue metoprolol  Diabetes mellitus Hyperglycemia. Two days ago only  received 35 units of lantus. Fasting blood sugar is up today.  -continue Lantus 35u BID -continue SSI  Chronic diastolic heart failure Last TTE on 4/28 with EF of 55-60% with grade 1 diastolic dysfunction. EDW of 105.5kg  Leukocytosis No evidence of abscess. Unknown source.  Tachycardia Unsure of etiology. EKG significant for sinus tachycardia. -continue metoprolol  Anemia of chronic disease S/p 2 units of PRBC in dialysis on 06/28/2016. Hemoglobin down again -receiving 1 unit of PRBC in dialysis today (5/21)   DVT prophylaxis: heparin subq Code Status: Full code Family Communication: None at bedside Disposition Plan: SNF when continued workup for mental status is complete.   Consultants:   Palliative care medicine  Neurology  Nephrology  Procedures:   Dialysis MWF  Antimicrobials:  Vancomycin  Cefepime  Zosyn    Subjective: Patient still non-verbal  Objective: Vitals:   07/01/16 0930 07/01/16 1000 07/01/16 1030 07/01/16 1045  BP: (!) 105/40 (!) 107/54 (!) 154/71 (!) (P) 110/50  Pulse: (!) 115 (!) 114 (!) 110 (!) (P) 110  Resp:   (!) 21 (!) (P) 24  Temp:   97.2 F (36.2 C) (P) 97.7 F (36.5 C)  TempSrc:   Oral (P) Oral  SpO2:      Weight:      Height:       No intake or output data in the 24 hours ending 07/01/16 1053 Filed Weights   06/28/16 1640 06/30/16 0500 07/01/16 0719  Weight: 100.5 kg (221 lb 9 oz) 104 kg (229 lb 4.5 oz) 104 kg (229 lb 4.5 oz)    Examination:  General exam: Appears calm and comfortable. Respiratory system: Clear to auscultation. Respiratory effort normal. Cardiovascular system: S1 & S2 heard,  elevated rate, normal rhythm. No murmurs. Gastrointestinal system: Abdomen is nondistended, soft and nontender. Normal bowel sounds heard. Central nervous system: Alert and unable to assess orientation. Extremities: No edema. No calf tenderness. Induration of left lateral thigh Skin: No cyanosis. No cellulitis. Left thigh  wrapped in gauze. Multiple areas of lichenification on bilateral legs Psychiatry: Not able to assess.    Data Reviewed: I have personally reviewed following labs and imaging studies  CBC:  Recent Labs Lab 06/25/16 0218 06/26/16 0624  06/27/16 0501 06/28/16 1233 06/29/16 0946 06/30/16 2322 07/01/16 0728  WBC 16.3* 15.5*  < > 18.9* 19.4* 14.5* 12.4* 16.2*  NEUTROABS 11.6* 10.3*  --   --   --   --  8.7*  --   HGB 6.8* 8.1*  < > 7.1* 6.7* 10.4* 8.8* 7.1*  HCT 23.0* 26.4*  < > 24.0* 22.4* 32.6* 28.6* 23.5*  MCV 91.6 90.7  < > 93.4 94.5 91.6 92.6 91.8  PLT 393 389  < > 396 417* 386 457* 432*  < > = values in this interval not displayed. Basic Metabolic Panel:  Recent Labs Lab 06/26/16 0713 06/27/16 0501 06/28/16 1233 07/01/16 0609 07/01/16 0729  NA 134* 134* 134* 132* 130*  K 3.8 4.2 4.2 4.7 4.0  CL 94* 97* 98* 94* 94*  CO2 26 27 25 24 25   GLUCOSE 64* 167* 173* 281* 307*  BUN 73* 37* 69* 87* 88*  CREATININE 2.83* 1.95* 2.96* 2.95* 3.00*  CALCIUM 8.9 8.6* 8.9 9.0 8.7*  PHOS 2.6  --  2.7 2.9 2.8   GFR: Estimated Creatinine Clearance: 28.5 mL/min (A) (by C-G formula based on SCr of 3 mg/dL (H)). Liver Function Tests:  Recent Labs Lab 06/26/16 0624 06/26/16 0713 06/28/16 1233 07/01/16 0609 07/01/16 0729  AST 17  --   --   --   --   ALT 21  --   --   --   --   ALKPHOS 152*  --   --   --   --   BILITOT 0.6  --   --   --   --   PROT 7.6  --   --   --   --   ALBUMIN 1.4* 1.1* 1.0* 1.1* <1.0*   No results for input(s): LIPASE, AMYLASE in the last 168 hours. No results for input(s): AMMONIA in the last 168 hours. Coagulation Profile: No results for input(s): INR, PROTIME in the last 168 hours. Cardiac Enzymes: No results for input(s): CKTOTAL, CKMB, CKMBINDEX, TROPONINI in the last 168 hours. BNP (last 3 results) No results for input(s): PROBNP in the last 8760 hours. HbA1C: No results for input(s): HGBA1C in the last 72 hours. CBG:  Recent Labs Lab  06/30/16 1209 06/30/16 1637 06/30/16 2105 07/01/16 0121 07/01/16 0349  GLUCAP 227* 227* 198* 203* 254*   Lipid Profile: No results for input(s): CHOL, HDL, LDLCALC, TRIG, CHOLHDL, LDLDIRECT in the last 72 hours. Thyroid Function Tests: No results for input(s): TSH, T4TOTAL, FREET4, T3FREE, THYROIDAB in the last 72 hours. Anemia Panel: No results for input(s): VITAMINB12, FOLATE, FERRITIN, TIBC, IRON, RETICCTPCT in the last 72 hours. Sepsis Labs: No results for input(s): PROCALCITON, LATICACIDVEN in the last 168 hours.  Recent Results (from the past 240 hour(s))  MRSA PCR Screening     Status: None   Collection Time: 06/25/16  2:26 AM  Result Value Ref Range Status   MRSA by PCR NEGATIVE NEGATIVE Final    Comment:  The GeneXpert MRSA Assay (FDA approved for NASAL specimens only), is one component of a comprehensive MRSA colonization surveillance program. It is not intended to diagnose MRSA infection nor to guide or monitor treatment for MRSA infections.   Culture, blood (routine x 2)     Status: None   Collection Time: 06/25/16  7:02 PM  Result Value Ref Range Status   Specimen Description BLOOD RIGHT HAND  Final   Special Requests IN PEDIATRIC BOTTLE Blood Culture adequate volume  Final   Culture NO GROWTH 5 DAYS  Final   Report Status 06/30/2016 FINAL  Final  Culture, blood (routine x 2)     Status: None   Collection Time: 06/25/16  7:40 PM  Result Value Ref Range Status   Specimen Description BLOOD RIGHT HAND  Final   Special Requests IN PEDIATRIC BOTTLE Blood Culture adequate volume  Final   Culture NO GROWTH 5 DAYS  Final   Report Status 06/30/2016 FINAL  Final  Culture, blood (routine x 2)     Status: None (Preliminary result)   Collection Time: 06/27/16  1:11 PM  Result Value Ref Range Status   Specimen Description BLOOD RIGHT HAND  Final   Special Requests   Final    BOTTLES DRAWN AEROBIC ONLY Blood Culture results may not be optimal due to an  inadequate volume of blood received in culture bottles   Culture NO GROWTH 4 DAYS  Final   Report Status PENDING  Incomplete  Culture, blood (routine x 2)     Status: None (Preliminary result)   Collection Time: 06/27/16  2:28 PM  Result Value Ref Range Status   Specimen Description BLOOD RIGHT WRIST  Final   Special Requests IN PEDIATRIC BOTTLE Blood Culture adequate volume  Final   Culture NO GROWTH 4 DAYS  Final   Report Status PENDING  Incomplete         Radiology Studies: No results found.      Scheduled Meds: . aspirin  325 mg Per Tube Daily  . atorvastatin  20 mg Per Tube QPM  . chlorhexidine  15 mL Mouth Rinse BID  . darbepoetin (ARANESP) injection - DIALYSIS  200 mcg Intravenous Q Wed-HD  . feeding supplement (PRO-STAT SUGAR FREE 64)  30 mL Per Tube QID  . FLUoxetine  20 mg Per Tube Daily  . heparin subcutaneous  5,000 Units Subcutaneous Q8H  . insulin aspart  0-20 Units Subcutaneous Q4H  . insulin glargine  35 Units Subcutaneous BID  . metoprolol tartrate  25 mg Per Tube BID  . Zinc Oxide   Topical BID   Continuous Infusions: . sodium chloride    . sodium chloride    . sodium chloride    . sodium chloride    . sodium chloride    . sodium chloride    . feeding supplement (OSMOLITE 1.5 CAL) 1,000 mL (06/30/16 1741)  . sodium chloride       LOS: 23 days     Jacquelin Hawking, MD [facility] 07/01/2016, 10:53 AM Pager: [phone]  If 7PM-7AM, please contact night-coverage [email or web address] Password [facility] 07/01/2016, 10:53 AM

## 2016-07-01 NOTE — Progress Notes (Signed)
Shift event note: Notified just after shift change regarding persistent bleeding to (L) thigh wound. Day RN reported that wound vac became contaminated w/ stool so it was removed. Since removal the wound had continued to soak dressings. Orders given initially to remove dressing, apply thrombi pad, and bulky pressure dressing. Initially this seemed to be effective in slowing the bleeding but ultimately the dressing was once again saturated. Went to bedside to assess pt.  Dressing completely saturated w/ bright red blood. Dressing removed. Was able to see multiple areas of slow but persistent bleeding at the center and upper edges of the wound. Discussed pt w/ Dr Corliss Skainssuei w/ surgery service who recommended repeating thrombi pad dressing w/ pressure dressing. Assisted staff in cleaning wound, applying 4 thrombi-pads and multiple ABD pads and secured w/ bulky kling. CBC ordered earlier revealed Hb 8.8 from 10.4 on 5/19.VSS and pt remains afebrile.  Assessment/Plan: 1. Persistent bleeding from (L) thigh wound: Now redressed w/ multiple Thrombin pads and bulky pressure dressing. Will monitor closely for further bleeding w/ low threshold to re-consult surgery if second Thrombin dressing not effective. Will repeat CBC in 8 hours. Wound care consult to eval wound vac re-application and re-eval multiple pressure wounds. Will hold SQ heparin for now. Will defer further changes in plan to rounding MD.   Leanne ChangKatherine P. Schorr, NP-C [facility] Pager [phone]

## 2016-07-01 NOTE — Progress Notes (Signed)
[facility] Progress Note  14 Days Post-Op  Subjective: CC: encephalopathy Spontaneously opening eyes. Non-verbal. Groans and withdraws in response to pain.  Objective: Vital signs in last 24 hours: Temp:  [97 F (36.1 C)-98.7 F (37.1 C)] 98.7 F (37.1 C) (05/21 1259) Pulse Rate:  [65-116] 80 (05/21 1259) Resp:  [16-24] 20 (05/21 1259) BP: (103-178)/(35-118) 122/82 (05/21 1259) SpO2:  [98 %-100 %] 98 % (05/21 1259) Weight:  [104 kg (229 lb 4.5 oz)] 104 kg (229 lb 4.5 oz) (05/21 1149) Last BM Date: 06/30/16  Intake/Output from previous day: No intake/output data recorded. Intake/Output this shift: Total I/O In: 635 [Blood:335; NG/GT:300] Out: 0   PE: Gen:  NAD, non-verbal Left thigh wound: improving some, small amount necrotic tissue debrided from proximal wound margin. > 80% granulation tissue   Right medial thigh: ~10 x4 cm discoloration and induration with some skin breakdown. No warmth or fluctuance. No cellulitis. Not obviously TTP.     Lab Results:   Recent Labs  06/30/16 2322 07/01/16 0728  WBC 12.4* 16.2*  HGB 8.8* 7.1*  HCT 28.6* 23.5*  PLT 457* 432*   BMET  Recent Labs  07/01/16 0609 07/01/16 0729  NA 132* 130*  K 4.7 4.0  CL 94* 94*  CO2 24 25  GLUCOSE 281* 307*  BUN 87* 88*  CREATININE 2.95* 3.00*  CALCIUM 9.0 8.7*   CMP     Component Value Date/Time   NA 130 (L) 07/01/2016 0729   NA 137 09/16/2013 1301   K 4.0 07/01/2016 0729   K 4.7 09/16/2013 1301   CL 94 (L) 07/01/2016 0729   CL 108 (H) 09/16/2013 1301   CO2 25 07/01/2016 0729   CO2 24 09/16/2013 1301   GLUCOSE 307 (H) 07/01/2016 0729   GLUCOSE 125 (H) 09/16/2013 1301   BUN 88 (H) 07/01/2016 0729   BUN 15 09/16/2013 1301   CREATININE 3.00 (H) 07/01/2016 0729   CREATININE 1.79 (H) 09/16/2013 1301   CALCIUM 8.7 (L) 07/01/2016 0729   CALCIUM 8.3 (L) 09/16/2013 1301   PROT 7.6 06/26/2016 0624   PROT 7.0 09/16/2013 1301   ALBUMIN <1.0 (L) 07/01/2016 0729   ALBUMIN 1.3 (L) 09/16/2013 1301   AST 17 06/26/2016 0624   AST 10 (L) 09/16/2013 1301   ALT 21 06/26/2016 0624   ALT 11 (L) 09/16/2013 1301   ALKPHOS 152 (H) 06/26/2016 0624   ALKPHOS 59 09/16/2013 1301   BILITOT 0.6 06/26/2016 0624   BILITOT 0.2 09/16/2013 1301   GFRNONAA 19 (L) 07/01/2016 0729   GFRNONAA 36 (L) 09/16/2013 1301   GFRAA 21 (L) 07/01/2016 0729   GFRAA 42 (L) 09/16/2013 1301    Anti-infectives: Anti-infectives    Start     Dose/Rate Route Frequency Ordered Stop   06/28/16 0550  piperacillin-tazobactam (ZOSYN) IVPB 3.375 g  Status:  Discontinued     3.375 g 12.5 mL/hr over 240 Minutes Intravenous Every 12 hours 06/27/16 1844 06/29/16 1216   06/21/16 1200  vancomycin (VANCOCIN) IVPB 1000 mg/200 mL premix  Status:  Discontinued     1,000 mg 200 mL/hr over 60 Minutes Intravenous Every M-W-F (Hemodialysis) 06/19/16 1009 06/29/16 1216   06/20/16 0600  ceFAZolin (ANCEF) IVPB 2g/100 mL premix  Status:  Discontinued     2 g 200 mL/hr over 30 Minutes Intravenous To Radiology 06/19/16 1427 06/21/16 0600   06/19/16 1200  vancomycin (VANCOCIN) IVPB 750 mg/150 ml premix     750 mg 150 mL/hr over 60 Minutes  Intravenous Every M-W-F (Hemodialysis) 06/19/16 1009 06/19/16 1301   06/19/16 1158  Vancomycin (VANCOCIN) 750-5 MG/150ML-% IVPB    Comments:  Herriott, [name]   : cabinet override      06/19/16 1158 06/19/16 1215   07/02/2016 1300  piperacillin-tazobactam (ZOSYN) IVPB 3.375 g  Status:  Discontinued     3.375 g 12.5 mL/hr over 240 Minutes Intravenous Every 12 hours 06/16/2016 1231 06/27/16 1844   06/14/16 0931  vancomycin (VANCOCIN) 1-5 GM/200ML-% IVPB    Comments:  Bluford Kaufmann   : cabinet override      06/14/16 0931 06/14/16 1113   06/12/16 1700  ceFEPIme (MAXIPIME) 1 g in dextrose 5 % 50 mL IVPB  Status:  Discontinued     1 g 100 mL/hr over 30 Minutes Intravenous Every 24 hours 06/12/16 1648 06/25/2016 1205   06/12/16 0959  vancomycin (VANCOCIN) 1-5 GM/200ML-% IVPB      Comments:  Carlyon Prows   : cabinet override      06/12/16 0959 06/12/16 1015   06/10/16 1200  vancomycin (VANCOCIN) IVPB 1000 mg/200 mL premix  Status:  Discontinued     1,000 mg 200 mL/hr over 60 Minutes Intravenous Every M-W-F (Hemodialysis) 06/10/16 0851 06/19/16 1009   06/10/16 1000  piperacillin-tazobactam (ZOSYN) IVPB 3.375 g  Status:  Discontinued     3.375 g 12.5 mL/hr over 240 Minutes Intravenous Every 12 hours 06/10/16 0851 06/12/16 1629   06/10/16 1000  vancomycin (VANCOCIN) 2,250 mg in sodium chloride 0.9 % 500 mL IVPB  Status:  Discontinued     2,250 mg 250 mL/hr over 120 Minutes Intravenous Every 12 hours 06/10/16 0851 06/10/16 0858   06/10/16 1000  vancomycin (VANCOCIN) 2,250 mg in sodium chloride 0.9 % 500 mL IVPB     2,250 mg 250 mL/hr over 120 Minutes Intravenous  Once 06/10/16 0858 06/10/16 1640       Assessment/Plan Left thigh soft tissue infection s/p excision of 15x6x2.5 cm area skin and subcutaneous tissue of left thigh 05/21/16, Dr. Emelia Loron s/p Excision of skin and subcutaneous tissue of left thigh (25 x 8 x 3 cm), 06/20/2016, Dr. Manus Rudd - afebrile, Leukocytosis stable (16) - VAC dressing was removed over the weekend due to fecal matter contamination - resuming VAC changes MWF  Skin changes right thigh - not obviously causing patient pain. No clinical signs/sxs infection. 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 Acute surgical intervention is not indicated. Continue with daily hygiene. We will follow. Should infectious symptoms develop, I recommend re-addressing goals of care with family prior to considering operative intervention.   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

## 2016-07-01 NOTE — Plan of Care (Signed)
Problem: Safety: Goal: Ability to remain free from injury will improve Outcome: Progressing Patient wound vac removed d/t fecal matter contamination. Patient bleeding stopped with thrombi pads, abd pads and gauze. Will continue to monitor  Problem: Pain Managment: Goal: General experience of comfort will improve Outcome: Progressing Patient pain managed with prn dilaudid. Patient resting, will continue to monitor.

## 2016-07-01 NOTE — Procedures (Signed)
I have personally attended this patient's dialysis session.   On HD with goal to keep even L AVG 400 Pt non verbal and not following commands  1 unit PRBC's for Hb 7.1  Camille Balynthia Sadeel Fiddler, MD [facility] [phone] Pager 07/01/2016, 8:48 AM

## 2016-07-01 NOTE — Progress Notes (Signed)
STROKE TEAM PROGRESS NOTE   SUBJECTIVE (INTERVAL HISTORY) No family is at the bedside.  Pt is back from dialysis. Seems more awake than yesterday but still nonverbal, not following commands. MRI still pending    OBJECTIVE Temp:  [97.6 F (36.4 C)-98.2 F (36.8 C)] 97.6 F (36.4 C) (05/21 0538) Pulse Rate:  [85-104] 98 (05/21 0538) Cardiac Rhythm: Normal sinus rhythm;Heart block (05/21 0700) Resp:  [16-18] 18 (05/21 0538) BP: (110-132)/(40-76) 115/65 (05/21 0538) SpO2:  [96 %-100 %] 100 % (05/21 0538)  CBC:  Recent Labs Lab 06/26/16 0624  06/30/16 2322 07/01/16 0728  WBC 15.5*  < > 12.4* 16.2*  NEUTROABS 10.3*  --  8.7*  --   HGB 8.1*  < > 8.8* 7.1*  HCT 26.4*  < > 28.6* 23.5*  MCV 90.7  < > 92.6 91.8  PLT 389  < > 457* 432*  < > = values in this interval not displayed.  Basic Metabolic Panel:   Recent Labs Lab 06/28/16 1233 07/01/16 0609  NA 134* 132*  K 4.2 4.7  CL 98* 94*  CO2 25 24  GLUCOSE 173* 281*  BUN 69* 87*  CREATININE 2.96* 2.95*  CALCIUM 8.9 9.0  PHOS 2.7 2.9    Lipid Panel:     Component Value Date/Time   CHOL 160 06/08/2016 0448   TRIG 146 06/08/2016 0448   HDL 45 06/08/2016 0448   CHOLHDL 3.6 06/08/2016 0448   VLDL 29 06/08/2016 0448   LDLCALC 86 06/08/2016 0448   HgbA1c:  Lab Results  Component Value Date   HGBA1C 6.7 (H) 06/09/2016   Urine Drug Screen:     Component Value Date/Time   LABOPIA NEGATIVE 09/05/2013 1822   LABOPIA NEG 08/26/2012 1251   COCAINSCRNUR NEGATIVE 09/05/2013 1822   LABBENZ NEGATIVE 09/05/2013 1822   AMPHETMU NEGATIVE 09/05/2013 1822    Alcohol Level     Component Value Date/Time   ETH <5 06/01/2016 1735    IMAGING I have personally reviewed the radiological images below and agree with the radiology interpretations.  Ct Head Wo Contrast 06/18/2016 No acute abnormality. Age advanced atrophy and multiple bilateral infarcts as seen on prior exams. Atherosclerosis.   Ct Head Wo Contrast 06/08/2016 1.  No acute intracranial abnormality identified.  2. Stable chronic basal ganglia and cerebellar hemisphere lacunar infarcts.   Mr Maxine Glenn Head Wo Contrast 05/19/2016  MRI HEAD:  Motion degraded examination. 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 Multiple old infarcts. Moderate parenchymal brain volume loss for age.   MRA HEAD:  Moderately motion degraded examination. Slow flow versus occluded MCA, likely chronic given prior CT findings. Chronically occluded RIGHT internal carotid artery, and LEFT vertebral artery. Chronically stenotic proximal LEFT M1 segment, difficult to quantify. 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   MRI brain pending   PHYSICAL EXAM  Temp:  [97.6 F (36.4 C)-98.2 F (36.8 C)] 97.6 F (36.4 C) (05/21 0538) Pulse Rate:  [85-104] 98 (05/21 0538) Resp:  [16-18] 18 (05/21 0538) BP: (110-132)/(40-76) 115/65 (05/21 0538) SpO2:  [96 %-100 %] 100 % (05/21 0538)  General - morbid obesity, well developed, nonverbal, not following commands.  Ophthalmologic - Fundi not visualized due to noncooperation.  Cardiovascular - Regular rate and rhythm.  Neuro - eyes open, nonverbal, intermittent moaning with pain stimulation, not following commands, not tracking and not blinking to visual threat, eyes right gaze preference, slight disconjugate eyes. PERRL, positive corneal and gag. Spontaneous movement of right elbow, but  not right fingers or shoulder even with pain stimulation, 0/5 RLE, and increased muscle tone on the RUE and RLE. LUE 0/5 flaccid, LLE 2/5 on pain stimulation, decreased muscle tone. Sensation, coordination and gait not tested.  ASSESSMENT/PLAN [name] is a 41 y.o. 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 She did not receive IV t-PA due to unknown time of onset.  Stroke:  Right MCA and right MCA/PCA infarcts, consistent with new right MCA occlusion which have been progressed from 2015.   Resultant  AMS, left hemiplegia  CT head - 06/04/2016 - No acute intracranial abnormality identified.  MRI head - acute RIGHT deep gray nuclei and RIGHT hippocampal infarcts  MRA head - Slow flow versus occluded right MCA. Chronically occluded RIGHT ICA, and LEFT VA. Chronically stenotic proximal LEFT M1 segment.  MRI repeat pending  Carotid Doppler - Left vertebral artery flow is absent. Rt ICA occluded.  2D Echo - EF 55-60%. Possible vegetation of the left coronary cusp.  TEE - 06/26/2016 - EF 55-60%. No cardiac source of emboli identified. No thrombus no vegetation.  LDL - 86  HgbA1c - 6.7  VTE prophylaxis - subcutaneous heparin Diet NPO time specified  No antithrombotic prior to admission, now on aspirin 325 mg daily.   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 However at that time, right M2 reconstituted beyond M1 with early moyamoya type changes  However, MRA 05/29/2016 showed right MCA occlusion, progress from 08/2013, causing right MCA and MCA/PCA infarcts  Frequent hypotensive reading (SBP 90s-100s) throughout this admission  Avoid  hypotension  Not candidate for any intervention  BP goal 130-160 due to right ICA and MCA occlusion  Hx of stroke  Old infarct on MRI 08/2013 - b/l BG and b/l cerebellar   Acute infarct on MRI 08/2013 - Multiple small infarcts involving both cerebral hemispheres, corpus callosum, and medulla, consistent with embolic infarcts. - CTA showed right ICA occlusion, right MCA reconstituted at M2 with M1 region moyamoya type changes. - TEE no LAA thrombus, no PFO. LE venous doppler no DVT, LDL 130  Hypertension  Stable - low at times  BP goal 130-160  Hyperlipidemia  Home meds:  Lipitor 20 mg daily resumed in hospital  LDL 86, goal < 70  Continue statin at discharge  Diabetes  HgbA1c 6.6, goal < 7.0  Controlled  SSI  Continue current management  Other Stroke Risk Factors  Obesity, Body mass index is 41.94 kg/m., recommend weight loss, diet and exercise as appropriate   ESRD on HD  Other Active Problems  NPO - on tube feedings.  Anemia  Leukocytosis  LE soft tissue wounds  Agree with Palliative Care consult   Hospital day # 23  Marvel PlanJindong Rejoice Heatwole, MD PhD Stroke Neurology 07/01/2016 4:56 PM  To contact Stroke Continuity provider, please refer to [email or web address]. After hours, contact General Neurology

## 2016-07-02 ENCOUNTER — Inpatient Hospital Stay (HOSPITAL_COMMUNITY): Payer: Medicaid Other

## 2016-07-02 LAB — TYPE AND SCREEN
ABO/RH(D): A POS
Antibody Screen: NEGATIVE
Unit division: 0

## 2016-07-02 LAB — CULTURE, BLOOD (ROUTINE X 2)
CULTURE: NO GROWTH
CULTURE: NO GROWTH
Special Requests: ADEQUATE

## 2016-07-02 LAB — BPAM RBC
Blood Product Expiration Date: 201806012359
ISSUE DATE / TIME: 201805211006
Unit Type and Rh: 6200

## 2016-07-02 LAB — CBC
HCT: 26.1 % — ABNORMAL LOW (ref 36.0–46.0)
Hemoglobin: 8.2 g/dL — ABNORMAL LOW (ref 12.0–15.0)
MCH: 29.1 pg (ref 26.0–34.0)
MCHC: 31.4 g/dL (ref 30.0–36.0)
MCV: 92.6 fL (ref 78.0–100.0)
PLATELETS: 455 10*3/uL — AB (ref 150–400)
RBC: 2.82 MIL/uL — AB (ref 3.87–5.11)
RDW: 18.3 % — ABNORMAL HIGH (ref 11.5–15.5)
WBC: 18.8 10*3/uL — AB (ref 4.0–10.5)

## 2016-07-02 LAB — GLUCOSE, CAPILLARY
GLUCOSE-CAPILLARY: 167 mg/dL — AB (ref 65–99)
GLUCOSE-CAPILLARY: 148 mg/dL — AB (ref 65–99)
Glucose-Capillary: 144 mg/dL — ABNORMAL HIGH (ref 65–99)
Glucose-Capillary: 157 mg/dL — ABNORMAL HIGH (ref 65–99)
Glucose-Capillary: 163 mg/dL — ABNORMAL HIGH (ref 65–99)
Glucose-Capillary: 198 mg/dL — ABNORMAL HIGH (ref 65–99)

## 2016-07-02 LAB — RENAL FUNCTION PANEL
Albumin: 1.2 g/dL — ABNORMAL LOW (ref 3.5–5.0)
Anion gap: 11 (ref 5–15)
BUN: 44 mg/dL — ABNORMAL HIGH (ref 6–20)
CALCIUM: 8.4 mg/dL — AB (ref 8.9–10.3)
CO2: 27 mmol/L (ref 22–32)
CREATININE: 1.83 mg/dL — AB (ref 0.44–1.00)
Chloride: 94 mmol/L — ABNORMAL LOW (ref 101–111)
GFR, EST AFRICAN AMERICAN: 39 mL/min — AB (ref >60)
GFR, EST NON AFRICAN AMERICAN: 34 mL/min — AB (ref >60)
Glucose, Bld: 221 mg/dL — ABNORMAL HIGH (ref 65–99)
Phosphorus: 1.7 mg/dL — ABNORMAL LOW (ref 2.5–4.6)
Potassium: 4 mmol/L (ref 3.5–5.1)
SODIUM: 132 mmol/L — AB (ref 135–145)

## 2016-07-02 MED ORDER — MODAFINIL 100 MG PO TABS
200.0000 mg | ORAL_TABLET | Freq: Every day | ORAL | Status: DC
  Administered 2016-07-03 – 2016-07-15 (×9): 200 mg via ORAL
  Filled 2016-07-02 (×9): qty 2

## 2016-07-02 NOTE — Progress Notes (Signed)
PROGRESS NOTE    Tejal Monroy  ZOX:[id number] DOB: 1975/10/01 DOA: 05/14/2016 PCP: Marletta Lor, NP   Brief Narrative: [name] is a 41 y.o. female with a history of CVA, DM type 2, chronic lymphedema, HTN, chronic diastolic heart failure, depression. She presented with acute altered mental status and found to have new CVA.   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 Possibly secondary to strokes. No evidence of infection at this point. -neurology recommendations: repeat MRI to assess for new stroke (still awaiting results)  Acute multifocal CVAs History of CVAs Neurology evaluated and signed off initially. Re-consulted on 06/30/16 -continue aspirin -neurology recommendations -re-consult palliative care pending results of MRI  L posterior/medial thigh wound Fevers Afebrile. Blood cultures have been negative. Repeat CT without abscess. TEE without evidence of vegetations. Increased bleeding overnight. Wound care to assess. Wound vac reapplied -repeat blood cultures negative to date  ESRD on HD Dialysis MWF  Reactive RPR t pallidum abs negative.  Morbid obesity Body mass index is 41.41 kg/m  Nutrition PEG tube -continue tube feeds -nutrition recommendations  Essential hypertension -continue metoprolol  Diabetes  mellitus Hyperglycemia. Better controlled today. -continue Lantus 35u BID -continue SSI  Chronic diastolic heart failure Last TTE on 4/28 with EF of 55-60% with grade 1 diastolic dysfunction. EDW of 105.5kg  Leukocytosis No evidence of abscess. Unknown source.  Tachycardia Unsure of etiology. EKG significant for sinus tachycardia. -continue metoprolol  Anemia of chronic disease S/p 2 units of PRBC in dialysis on 06/28/2016. Received 1 unit of PRBC in dialysis (5/21)  Multiple pressure ulcers -wound care   DVT prophylaxis: heparin subq Code Status: Full code Family Communication: None at bedside Disposition Plan: LTAC or goals of care discussion as patient cannot tolerate outpatient dialysis.   Consultants:   Palliative care medicine  Neurology  Nephrology  Procedures:   Dialysis MWF  Antimicrobials:  Vancomycin  Cefepime  Zosyn    Subjective: Patient still non-verbal  Objective: Vitals:   07/02/16 0030 07/02/16 0407 07/02/16 0511 07/02/16 0900  BP: 138/90 (!) 85/51 (!) 126/94 (!) 148/95  Pulse: (!) 113 98 95 77  Resp: 20 20  20   Temp: 99.3 F (37.4 C) 99.7 F (37.6 C)  99.2 F (37.3 C)  TempSrc: Axillary Axillary  Axillary  SpO2: 99% 100%  98%  Weight:  113 kg (249 lb 1.9 oz)    Height:        Intake/Output Summary (Last 24 hours) at 07/02/16 1122 Last data filed at 07/02/16 0400  Gross per 24 hour  Intake           1032.5 ml  Output              200 ml  Net            832.5 ml   [employer]   07/01/16  0719 07/01/16 1149 07/02/16 0407  Weight: 104 kg (229 lb 4.5 oz) 104 kg (229 lb 4.5 oz) 113 kg (249 lb 1.9 oz)    Examination:  General exam: Appears calm and comfortable. Respiratory system: Clear to auscultation. Respiratory effort normal. Cardiovascular system: S1 & S2 heard, elevated rate, normal rhythm. No murmurs. Gastrointestinal system: Abdomen is nondistended, soft and nontender. Normal bowel sounds heard. Central nervous  system: Alert and unable to assess orientation. Extremities: No edema. No calf tenderness. Induration of left lateral thigh Skin: No cyanosis. No cellulitis. Left thigh wrapped in gauze. Multiple areas of lichenification on bilateral legs. Left thigh wound with slough Psychiatry: Not able to assess.    Data Reviewed: I have personally reviewed following labs and imaging studies  CBC:  Recent Labs Lab 06/26/16 0624  06/28/16 1233 06/29/16 0946 06/30/16 2322 07/01/16 0728 07/01/16 2237 07/02/16 0242  WBC 15.5*  < > 19.4* 14.5* 12.4* 16.2*  --  18.8*  NEUTROABS 10.3*  --   --   --  8.7*  --   --   --   HGB 8.1*  < > 6.7* 10.4* 8.8* 7.1* 8.6* 8.2*  HCT 26.4*  < > 22.4* 32.6* 28.6* 23.5* 28.2* 26.1*  MCV 90.7  < > 94.5 91.6 92.6 91.8  --  92.6  PLT 389  < > 417* 386 457* 432*  --  455*  < > = values in this interval not displayed. Basic Metabolic Panel:  Recent Labs Lab 06/26/16 0713 06/27/16 0501 06/28/16 1233 07/01/16 0609 07/01/16 0729 07/02/16 0242  NA 134* 134* 134* 132* 130* 132*  K 3.8 4.2 4.2 4.7 4.0 4.0  CL 94* 97* 98* 94* 94* 94*  CO2 26 27 25 24 25 27   GLUCOSE 64* 167* 173* 281* 307* 221*  BUN 73* 37* 69* 87* 88* 44*  CREATININE 2.83* 1.95* 2.96* 2.95* 3.00* 1.83*  CALCIUM 8.9 8.6* 8.9 9.0 8.7* 8.4*  PHOS 2.6  --  2.7 2.9 2.8 1.7*   GFR: Estimated Creatinine Clearance: 49.1 mL/min (A) (by C-G formula based on SCr of 1.83 mg/dL (H)). Liver Function Tests:  Recent Labs Lab 06/26/16 0624 06/26/16 0713 06/28/16 1233 07/01/16 0609 07/01/16 0729 07/02/16 0242  AST 17  --   --   --   --   --   ALT 21  --   --   --   --   --   ALKPHOS 152*  --   --   --   --   --   BILITOT 0.6  --   --   --   --   --   PROT 7.6  --   --   --   --   --   ALBUMIN 1.4* 1.1* 1.0* 1.1* <1.0* 1.2*   No results for input(s): LIPASE, AMYLASE in the last 168 hours. No results for input(s): AMMONIA in the last 168 hours. Coagulation Profile: No results for input(s): INR,  PROTIME in the last 168 hours. Cardiac Enzymes: No results for input(s): CKTOTAL, CKMB, CKMBINDEX, TROPONINI in the last 168 hours. BNP (last 3 results) No results for input(s): PROBNP in the last 8760 hours. HbA1C: No results for input(s): HGBA1C in the last 72 hours. CBG:  Recent Labs Lab 07/01/16 1945 07/01/16 2358 07/02/16 0407 07/02/16 0736 07/02/16 1100  GLUCAP 312* 284* 167* 144* 163*   Lipid Profile: No results for input(s): CHOL, HDL, LDLCALC, TRIG, CHOLHDL, LDLDIRECT in the last 72 hours. Thyroid Function Tests: No  results for input(s): TSH, T4TOTAL, FREET4, T3FREE, THYROIDAB in the last 72 hours. Anemia Panel: No results for input(s): VITAMINB12, FOLATE, FERRITIN, TIBC, IRON, RETICCTPCT in the last 72 hours. Sepsis Labs: No results for input(s): PROCALCITON, LATICACIDVEN in the last 168 hours.  Recent Results (from the past 240 hour(s))  MRSA PCR Screening     Status: None   Collection Time: 06/25/16  2:26 AM  Result Value Ref Range Status   MRSA by PCR NEGATIVE NEGATIVE Final    Comment:        The GeneXpert MRSA Assay (FDA approved for NASAL specimens only), is one component of a comprehensive MRSA colonization surveillance program. It is not intended to diagnose MRSA infection nor to guide or monitor treatment for MRSA infections.   Culture, blood (routine x 2)     Status: None   Collection Time: 06/25/16  7:02 PM  Result Value Ref Range Status   Specimen Description BLOOD RIGHT HAND  Final   Special Requests IN PEDIATRIC BOTTLE Blood Culture adequate volume  Final   Culture NO GROWTH 5 DAYS  Final   Report Status 06/30/2016 FINAL  Final  Culture, blood (routine x 2)     Status: None   Collection Time: 06/25/16  7:40 PM  Result Value Ref Range Status   Specimen Description BLOOD RIGHT HAND  Final   Special Requests IN PEDIATRIC BOTTLE Blood Culture adequate volume  Final   Culture NO GROWTH 5 DAYS  Final   Report Status 06/30/2016 FINAL  Final    Culture, blood (routine x 2)     Status: None (Preliminary result)   Collection Time: 06/27/16  1:11 PM  Result Value Ref Range Status   Specimen Description BLOOD RIGHT HAND  Final   Special Requests   Final    BOTTLES DRAWN AEROBIC ONLY Blood Culture results may not be optimal due to an inadequate volume of blood received in culture bottles   Culture NO GROWTH 4 DAYS  Final   Report Status PENDING  Incomplete  Culture, blood (routine x 2)     Status: None (Preliminary result)   Collection Time: 06/27/16  2:28 PM  Result Value Ref Range Status   Specimen Description BLOOD RIGHT WRIST  Final   Special Requests IN PEDIATRIC BOTTLE Blood Culture adequate volume  Final   Culture NO GROWTH 4 DAYS  Final   Report Status PENDING  Incomplete         Radiology Studies: No results found.      Scheduled Meds: . aspirin  325 mg Per Tube Daily  . atorvastatin  20 mg Per Tube QPM  . chlorhexidine  15 mL Mouth Rinse BID  . collagenase   Topical Daily  . darbepoetin (ARANESP) injection - DIALYSIS  200 mcg Intravenous Q Wed-HD  . feeding supplement (PRO-STAT SUGAR FREE 64)  30 mL Per Tube QID  . FLUoxetine  20 mg Per Tube Daily  . heparin subcutaneous  5,000 Units Subcutaneous Q8H  . insulin aspart  0-20 Units Subcutaneous Q4H  . insulin glargine  35 Units Subcutaneous BID  . metoprolol tartrate  25 mg Per Tube BID  . Zinc Oxide   Topical BID   Continuous Infusions: . sodium chloride    . sodium chloride    . sodium chloride    . sodium chloride    . feeding supplement (OSMOLITE 1.5 CAL) 1,000 mL (07/01/16 2000)  . sodium chloride       LOS: 24 days  Jacquelin Hawking, MD [facility] 07/02/2016, 11:22 AM Pager: [phone]  If 7PM-7AM, please contact night-coverage [email or web address] Password [email or web address] 07/02/2016, 11:22 AM

## 2016-07-02 NOTE — Care Management Note (Signed)
Case Management Note  Patient Details  Name: [name] MRN: [id number] Date of Birth: 10/28/1975  Subjective/Objective:                    Action/Plan: Pt with repeat MRI today. Waiting on plan for disposition at discharge. CM following.   Expected Discharge Date:                  Expected Discharge Plan:  Skilled Nursing Facility  In-House Referral:  Clinical Social Work  Discharge planning Services  CM Consult  Post Acute Care Choice:    Choice offered to:     DME Arranged:    DME Agency:     HH Arranged:    HH Agency:     Status of Service:  Completed, signed off  If discussed at [employer] Meetings, dates discussed:    Additional Comments:  Kermit BaloKelli F Gaynell Eggleton, RN 07/02/2016, 3:31 PM

## 2016-07-02 NOTE — Progress Notes (Signed)
STROKE TEAM PROGRESS NOTE   SUBJECTIVE (INTERVAL HISTORY) No family is at the bedside.  Pt neuro unchanged. MRI showed extra infarct at right thalamus. Pt neuro status still out of proportion to MRI findings. Will repeat EEG and put on provigil in am.     OBJECTIVE Temp:  [98.1 F (36.7 C)-99.7 F (37.6 C)] 99.4 F (37.4 C) (05/22 1341) Pulse Rate:  [67-113] 78 (05/22 1341) Cardiac Rhythm: Sinus tachycardia (05/22 0700) Resp:  [16-20] 16 (05/22 1341) BP: (85-148)/(51-95) 146/92 (05/22 1341) SpO2:  [98 %-100 %] 99 % (05/22 1341) Weight:  [113 kg (249 lb 1.9 oz)] 113 kg (249 lb 1.9 oz) (05/22 0407)  CBC:  Recent Labs Lab 06/26/16 0624  06/30/16 2322 07/01/16 0728 07/01/16 2237 07/02/16 0242  WBC 15.5*  < > 12.4* 16.2*  --  18.8*  NEUTROABS 10.3*  --  8.7*  --   --   --   HGB 8.1*  < > 8.8* 7.1* 8.6* 8.2*  HCT 26.4*  < > 28.6* 23.5* 28.2* 26.1*  MCV 90.7  < > 92.6 91.8  --  92.6  PLT 389  < > 457* 432*  --  455*  < > = values in this interval not displayed.  Basic Metabolic Panel:   Recent Labs Lab 07/01/16 0729 07/02/16 0242  NA 130* 132*  K 4.0 4.0  CL 94* 94*  CO2 25 27  GLUCOSE 307* 221*  BUN 88* 44*  CREATININE 3.00* 1.83*  CALCIUM 8.7* 8.4*  PHOS 2.8 1.7*    Lipid Panel:     Component Value Date/Time   CHOL 160 06/08/2016 0448   TRIG 146 06/08/2016 0448   HDL 45 06/08/2016 0448   CHOLHDL 3.6 06/08/2016 0448   VLDL 29 06/08/2016 0448   LDLCALC 86 06/08/2016 0448   HgbA1c:  Lab Results  Component Value Date   HGBA1C 6.7 (H) 06/09/2016   Urine Drug Screen:     Component Value Date/Time   LABOPIA NEGATIVE 09/05/2013 1822   LABOPIA NEG 08/26/2012 1251   COCAINSCRNUR NEGATIVE 09/05/2013 1822   LABBENZ NEGATIVE 09/05/2013 1822   AMPHETMU NEGATIVE 09/05/2013 1822    Alcohol Level     Component Value Date/Time   ETH <5 06/10/2016 1735    IMAGING I have personally reviewed the radiological images below and agree with the radiology  interpretations.  Ct Head Wo Contrast 06/18/2016 No acute abnormality. Age advanced atrophy and multiple bilateral infarcts as seen on prior exams. Atherosclerosis.   Ct Head Wo Contrast 06/09/2016 1. No acute intracranial abnormality identified.  2. Stable chronic basal ganglia and cerebellar hemisphere lacunar infarcts.   Mr Maxine Glenn Head Wo Contrast 05/12/2016  MRI HEAD:  Motion degraded examination. 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 Multiple old infarcts. Moderate parenchymal brain volume loss for age.   MRA HEAD:  Moderately motion degraded examination. Slow flow versus occluded MCA, likely chronic given prior CT findings. Chronically occluded RIGHT internal carotid artery, and LEFT vertebral artery. Chronically stenotic proximal LEFT M1 segment, difficult to quantify. 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   EEG 06/08/16  EEG Abnormalities: 1) generalized irregular slow activity 2) slow PDR Clinical Interpretation: This EEG is consistent with a moderate generalized non-specific cerebral dysfunction(encephalopathy). There was no seizure or seizure predisposition recorded on this study. Please note that a normal EEG does not preclude the possibility of epilepsy.   MRI Brain Wo Contrast 07/03/2015 1. Acute and subacute infarcts in the right thalamus,  posterior limb internal capsule, and periatrial white matter,  progressed from 05/21/2016. 2. Advanced remote ischemic injury.  EEG pending   PHYSICAL EXAM  Temp:  [98.1 F (36.7 C)-99.7 F (37.6 C)] 99.4 F (37.4 C) (05/22 1341) Pulse Rate:  [67-113] 78 (05/22 1341) Resp:  [16-20] 16 (05/22 1341) BP: (85-148)/(51-95) 146/92 (05/22 1341) SpO2:  [98 %-100 %] 99 % (05/22 1341) Weight:  [113 kg (249 lb 1.9 oz)] 113 kg (249 lb 1.9 oz) (05/22 0407)  General - morbid obesity, well developed, nonverbal, not following commands.  Ophthalmologic - Fundi not visualized due to  noncooperation.  Cardiovascular - Regular rate and rhythm.  Neuro - eyes open, nonverbal, intermittent moaning with pain stimulation, not following commands, not tracking and not blinking to visual threat, eyes right gaze preference, slight disconjugate eyes. PERRL, positive corneal and gag. Spontaneous movement of right elbow, but not right fingers or shoulder even with pain stimulation, 0/5 RLE, and increased muscle tone on the RUE and RLE. LUE 0/5 flaccid, LLE 2/5 on pain stimulation, decreased muscle tone. Sensation, coordination and gait not tested.  ASSESSMENT/PLAN [name] is a 41 y.o. female with history of diabetes mellitus, previous stroke, morbid obesity, hypertension, congestive heart failure, end-stage renal disease, and acute subcentimeter RIGHT deep gray nuclei and RIGHT hippocampal infarcts 06/06/2016  presenting with unresponsiveness. She did not receive IV t-PA due to unknown time of onset.  Stroke:  Right MCA and right MCA/PCA infarcts, consistent with new right MCA occlusion which have been progressed from 2015. However, pt mental status change still out of proportion to the infarcts.  Resultant  AMS, left hemiplegia  CT head - 05/15/2016 - No acute intracranial abnormality identified.  MRI head - acute RIGHT deep gray nuclei and RIGHT hippocampal infarcts  MRA head - Slow flow versus occluded right MCA. Chronically occluded RIGHT ICA, and LEFT VA. Chronically stenotic proximal LEFT M1 segment.  MRI repeat - Acute and subacute infarcts in the right thalamus, posterior limb internal capsule, and periventricular white matter  Carotid Doppler - Left vertebral artery flow is absent. Rt ICA occluded.  2D Echo - EF 55-60%. Possible vegetation of the left coronary cusp.  TEE - 06/14/2016 - EF 55-60%. No cardiac source of emboli identified. No thrombus no vegetation.  LDL - 86  HgbA1c - 6.7  VTE prophylaxis - subcutaneous heparin Diet NPO time  specified  No antithrombotic prior to admission, now on aspirin 325 mg daily.   Patient counseled to be compliant with her antithrombotic medications  Ongoing aggressive stroke risk factor management  Therapy recommendations:  SNF  Disposition: Pending  AMS - at least partially due to bilateral cerebral involvement due to infarcts - old left brain infarct and new right brain infarcts due to new right MCA occlusion and old right ICA occlusion  MRI brain repeat - mild progression from last MRI on 06/06/2016 due to frequent hypotension   Other etiology also present - ESRD on HD, leukocytosis, ongoing wound infection  AMS still out of proportion to the infarcts  EEG 06/08/16 - generalized slowing  Add provigil for arousal  Repeat EEG pending  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 However at that time, right M2 reconstituted beyond M1 with early moyamoya type changes  However, MRA 05/21/2016 showed right MCA occlusion, progress from 08/2013, causing right MCA and MCA/PCA infarcts  Frequent hypotensive reading (SBP 90s-100s) throughout this admission  Avoid hypotension  Not candidate for any intervention  BP goal 130-160 due to right ICA and MCA occlusion  Hx of stroke  Old infarct on MRI 08/2013 - b/l BG and b/l cerebellar   Acute infarct on MRI 08/2013 - Multiple small infarcts involving both cerebral hemispheres, corpus callosum, and medulla, consistent with embolic infarcts. - CTA showed right ICA occlusion, right MCA reconstituted at M2 with M1 region moyamoya type changes. - TEE no LAA thrombus, no PFO. LE venous doppler no DVT, LDL 130  Hypertension  Stable - low at times  BP goal 130-160  Hyperlipidemia  Home meds:  Lipitor 20 mg daily resumed in hospital  LDL 86, goal < 70  Continue statin at discharge  Diabetes  HgbA1c 6.6, goal < 7.0  Controlled  SSI  Continue current  management  Other Stroke Risk Factors  Obesity, Body mass index is 45.56 kg/m., recommend weight loss, diet and exercise as appropriate   ESRD on HD  Other Active Problems  NPO - on tube feedings.  Anemia  Leukocytosis  LE soft tissue wounds  Agree with Palliative Care consult   Hospital day # 24   Marvel PlanJindong Arisbeth Purrington, MD PhD Stroke Neurology 07/02/2016 11:41 PM    To contact Stroke Continuity provider, please refer to [email or web address]. After hours, contact General Neurology

## 2016-07-02 NOTE — Progress Notes (Signed)
Patient is moaning-pain medicine administered. Will continue to monitor

## 2016-07-02 NOTE — Progress Notes (Signed)
Physical Therapy Wound Treatment Patient Details  Name: [name] MRN: [id number] Date of Birth: 31-May-1975  Today's Date: 07/02/2016 Time: 1505-6979 Time Calculation (min): 29 min  Subjective  Subjective: Pt non verbal  Pain Score:  Intermittent withdrawal of leg  Wound Assessment  Wound / Incision (Open or Dehisced) 07/01/16 Non-pressure wound Leg Left;Distal;Posterior large area, not requested to see by bedside nurses, found by [facility] nurse today (Active)  Dressing Type Gauze (Comment);Moist to dry;ABD 07/02/2016  9:06 AM  Dressing Changed Changed 07/02/2016  9:06 AM  Dressing Status Clean;Dry;Intact 07/02/2016  9:06 AM  Dressing Change Frequency Daily 07/02/2016  9:06 AM  Site / Wound Assessment Yellow;Black 07/02/2016  9:06 AM  % Wound base Red or Granulating 0% 07/02/2016  9:06 AM  % Wound base Yellow/Fibrinous Exudate 50% 07/02/2016  9:06 AM  % Wound base Black/Eschar 50% 07/02/2016  9:06 AM  % Wound base Other/Granulation Tissue (Comment) 0% 07/02/2016  9:06 AM  Peri-wound Assessment Induration 07/02/2016  9:06 AM  Wound Length (cm) 12 cm 07/02/2016  9:06 AM  Wound Width (cm) 12 cm 07/02/2016  9:06 AM  Wound Depth (cm) 0.7 cm 07/02/2016  9:06 AM  Margins Unattached edges (unapproximated) 07/02/2016  9:06 AM  Closure None 07/02/2016  9:06 AM  Drainage Amount Moderate 07/02/2016  9:06 AM  Drainage Description Purulent;Green 07/02/2016  9:06 AM  Non-staged Wound Description Full thickness 07/02/2016  9:06 AM  Treatment Debridement (Selective);Hydrotherapy (Pulse lavage);Packing (Saline gauze) 07/02/2016  9:06 AM   Hydrotherapy Pulsed lavage therapy - wound location: lt posterior calf Pulsed Lavage with Suction (psi): 12 psi (8-12) Pulsed Lavage with Suction - Normal Saline Used: 1000 mL Pulsed Lavage Tip: Tip with splash shield Selective Debridement Selective Debridement - Location: lt posterior calf Selective Debridement - Tools Used: Forceps;Scissors Selective Debridement -  Tissue Removed: black and yellow necrotic tissue   Wound Assessment and Plan  Wound Therapy - Assess/Plan/Recommendations Wound Therapy - Clinical Statement: Pt presents to hydrotherapy with large posterior calf wound. Can benefit from hydrotherapy for removal of necrotic tissue and to promote wound healing. Pt with multiple medical problems so expect progress will be slow. Wound Therapy - Functional Problem List: Bed bound. Factors Delaying/Impairing Wound Healing: Diabetes Mellitus;Immobility;Multiple medical problems;Polypharmacy;Infection - systemic/local Hydrotherapy Plan: Debridement;Dressing change;Patient/family education;Pulsatile lavage with suction Wound Therapy - Frequency: 6X / week Wound Therapy - Follow Up Recommendations: Skilled nursing facility Wound Plan: See above  Wound Therapy Goals- Improve the function of patient's integumentary system by progressing the wound(s) through the phases of wound healing (inflammation - proliferation - remodeling) by: Decrease Necrotic Tissue to: 75 Decrease Necrotic Tissue - Progress: Goal set today Increase Granulation Tissue to: 25 Increase Granulation Tissue - Progress: Goal set today Goals/treatment plan/discharge plan were made with and agreed upon by patient/family: No, Patient unable to participate in goals/treatment/discharge plan and family unavailable Time For Goal Achievement: 7 days Wound Therapy - Potential for Goals: Fair  Goals will be updated until maximal potential achieved or discharge criteria met.  Discharge criteria: when goals achieved, discharge from hospital, MD decision/surgical intervention, no progress towards goals, refusal/missing three consecutive treatments without notification or medical reason.  GP     Shary Decamp [place]ok 07/02/2016, 9:43 AM Suanne Marker PT [phone]

## 2016-07-02 NOTE — Progress Notes (Signed)
Nutrition Follow Up  DOCUMENTATION CODES:   Morbid obesity  INTERVENTION:    Continue Osmolite 1.5 at goal rate of 50 ml/hr with Prostat liquid protein 30 ml QID  Total TF regimen providing 2200 kcals, 136 gm protein, 912 ml of free water  NUTRITION DIAGNOSIS:   Inadequate oral intake related to lethargy/confusion as evidenced by NPO status, ongoing  GOAL:   Patient will meet greater than or equal to 90% of their needs, met  MONITOR:   Diet advancement, PO intake, Weight trends, I & O's, TF tolerance  ASSESSMENT:   41 y.o.woman (SNF resident) with PMH of CVA, DM2, morbid obesity, chronic lymphedema, HTN, chronic diastolic heart failure, depression, ESRD on HD, and infected leg ulcer. Admitted on 4/27 after her HD session with mental status changes.   5/10 20 fr. G-tube placed under fluoro  Pt is non-verbal.  Spontaneously opens eyes. Osmolite 1.5 formula infusing at 50 ml/hr via G-tube; Prostat liqiud protein 30 ml QID. Medications reviewed.  Labs reviewed.  Sodium 132 (L).  Nephrology following for ESRD; HD continues. Receiving MWF wound VAC changes. CBG's [phone].  Diet Order:  Diet NPO time specified  Skin:    NPWT to Left inner thigh Right medial proximal thigh Left medial thigh Left posterior calf Sacrum; buttock several partial thickness areas of skin loss; MASD  Left heel: unstagable pressure injury  Last BM:  5/22  Height:   Ht Readings from Last 1 Encounters:  06/09/16 '5\' 2"'$  (1.575 m)   Weight: >>> stable  Wt Readings from Last 1 Encounters:  07/02/16 249 lb 1.9 oz (113 kg)   05/21  229 lb 05/20  229 lb 05/18  221 lb 04/29  226 lb 04/28  232 lb  Ideal Body Weight:  50 kg  BMI:  Body mass index is 45.56 kg/m.  Estimated Nutritional Needs:   Kcal:  2000-2200  Protein:  120-130 gm  Fluid:  per MD  EDUCATION NEEDS:   No education needs identified at this time  Arthur Holms, RD, LDN Pager #: [phone] After-Hours Pager  #: [phone]

## 2016-07-02 NOTE — Progress Notes (Signed)
[facility] Progress Note   Subjective:  Nonverbal. Eyes open but not tracking  Vitals:   07/02/16 0030 07/02/16 0407 07/02/16 0511 07/02/16 0900  BP: 138/90 (!) 85/51 (!) 126/94 (!) 148/95  Pulse: (!) 113 98 95 77  Resp: 20 20  20   Temp: 99.3 F (37.4 C) 99.7 F (37.6 C)  99.2 F (37.3 C)  TempSrc: Axillary Axillary  Axillary  SpO2: 99% 100%  98%  Weight:  113 kg (249 lb 1.9 oz)    Height:       Exam: Does not follow any commands Tachy rhythm  Other VS as noted Anteriorly lungs clear Abd with G-tube in place No LE edema; wound VAC draining serosanguinous fluids  L thigh, other superficial skin discoloration R medial thigh, with blackish ulcer forming  Not tracking or following any commands Dialysis Access: LUE AVG+ bruit   Inpatient medications: . aspirin  325 mg Per Tube Daily  . atorvastatin  20 mg Per Tube QPM  . chlorhexidine  15 mL Mouth Rinse BID  . collagenase   Topical Daily  . darbepoetin (ARANESP) injection - DIALYSIS  200 mcg Intravenous Q Wed-HD  . feeding supplement (PRO-STAT SUGAR FREE 64)  30 mL Per Tube QID  . FLUoxetine  20 mg Per Tube Daily  . heparin subcutaneous  5,000 Units Subcutaneous Q8H  . insulin aspart  0-20 Units Subcutaneous Q4H  . insulin glargine  35 Units Subcutaneous BID  . metoprolol tartrate  25 mg Per Tube BID  . Zinc Oxide   Topical BID   . sodium chloride    . sodium chloride    . sodium chloride    . sodium chloride    . feeding supplement (OSMOLITE 1.5 CAL) 1,000 mL (07/01/16 2000)  . sodium chloride     sodium chloride, sodium chloride, [DISCONTINUED] acetaminophen **OR** [DISCONTINUED] acetaminophen (TYLENOL) oral liquid 160 mg/5 mL **OR** acetaminophen, acetaminophen, Gerhardt's butt cream, glucagon (human recombinant), hydrALAZINE, HYDROcodone-acetaminophen, HYDROmorphone (DILAUDID) injection, metoprolol tartrate, ondansetron (ZOFRAN) IV    Recent Labs Lab 07/01/16 0609 07/01/16 0729 07/02/16 0242   NA 132* 130* 132*  K 4.7 4.0 4.0  CL 94* 94* 94*  CO2 24 25 27   GLUCOSE 281* 307* 221*  BUN 87* 88* 44*  CREATININE 2.95* 3.00* 1.83*  CALCIUM 9.0 8.7* 8.4*  PHOS 2.9 2.8 1.7*    Recent Labs Lab 06/26/16 0624  07/01/16 0609 07/01/16 0729 07/02/16 0242  AST 17  --   --   --   --   ALT 21  --   --   --   --   ALKPHOS 152*  --   --   --   --   BILITOT 0.6  --   --   --   --   PROT 7.6  --   --   --   --   ALBUMIN 1.4*  < > 1.1* <1.0* 1.2*  < > = values in this interval not displayed.  Recent Labs Lab 06/26/16 0624  06/30/16 2322 07/01/16 0728 07/01/16 2237 07/02/16 0242  WBC 15.5*  < > 12.4* 16.2*  --  18.8*  NEUTROABS 10.3*  --  8.7*  --   --   --   HGB 8.1*  < > 8.8* 7.1* 8.6* 8.2*  HCT 26.4*  < > 28.6* 23.5* 28.2* 26.1*  MCV 90.7  < > 92.6 91.8  --  92.6  PLT 389  < > 457* 432*  --  455*  < > =  values in this interval not displayed.      Component Value Date/Time   IRON 29 06/24/2016 0224   TIBC 108 (L) 06/24/2016 0224   FERRITIN 4,098 (H) 06/24/2016 0224   IRONPCTSAT 27 06/24/2016 0224   Dialysis Orders: MWF [facility]  4.5h    400/800  105.5kg  3K/2Ca  LUA AVG  Hep 4400 - Mircera 150mcg IV q 2 weeks (last given 4/25) - Calcitriol 0.7125mcg PO q HD  Assessment/Recommendations:  1.  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 Nonverbal. No participation in PT at all. Would not be able to return to outpt HD in current state.  Repeat MRI 5/22 pending  2. ESRD- Continue MWF schedule, keep SBP > 110. Cannot go back to OP HD unit in current state and would require LTAC. Unable to sit in chair for HD Palliative care consult pending.   3. L post medial thigh soft-tissue wound, sp I&D x 2, was on Vanc/Zosyn which have been stopped. Bleeding from wound over the weekend.  4. ID possible veg on TTE, not seen on TEE.  5. HTN cont MTP bid BP goal 130-160 per neuro  6. Anemia of CKD - max darbe  200 / wk, no IV Fe given infection.  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    8. HLD 9. DM - per primary care team 10. MO 11. Malnutrition - very low albumin . Getting TF's.     Tomasa Blasegechi Grace Ejigiri PA-C [facility] Pager [phone] 07/02/2016,11:24 AM   I have seen and examined this patient and agree with plan and assessment in the above note with renal recommendations/intervention highlighted. I spoke briefly with Murrell ConverseSarah Dunn NP who has seen her on past admissions for Palliative Care and she states the pt/family in the past have told her that she would "want to be on machines in the basement" even if unable to talk because wants her children to know she is there (despite the visual image)... Await current Palliative Consult. She is as previously stated no longer a suitable candidate for long term outpt HD in the absence of substantial neurologic improvement. Currently no LTAC options. HD tomorrow   Altus Zaino B,MD 07/02/2016 12:57 PM

## 2016-07-02 NOTE — Progress Notes (Signed)
Discussed new findings of stroke with sister. Sister understandable emotional and saddened. I discussed that the patient is not a candidate for dialysis as an outpatient per discussion with nephrology and she is not able to get into an LTAC. Will need further goals of care discussions as SNF would not be an option unless patient was to go with hospice (in which case, residential hospice may be a better option). Will re-consult palliative. Sister is, again, too emotional to have real discussion about this at the time (her mother was admitted to the hospital overnight). Hopefully a miracle will occur for this family, but as it is right now, Ms. Sandi MariscalMcKinnon will likely be looking at end of life care. She would be leaving behind a husband and young children, in addition to the rest of her family.  Jacquelin Hawkingalph Juventino Pavone, MD [facility] 07/02/2016, 3:46 PM Pager: [phone]

## 2016-07-02 NOTE — Consult Note (Signed)
WOC contacted MRI to verify that dressing can stay in place while in MRI. Ok to DC from machine, clamp dressing tubing and [facility] back to [facility].  Carlye Panameno [facility] MSN,RN,CWOCN,CNS

## 2016-07-03 ENCOUNTER — Inpatient Hospital Stay (HOSPITAL_COMMUNITY): Payer: Medicaid Other

## 2016-07-03 DIAGNOSIS — R401 Stupor: Secondary | ICD-10-CM

## 2016-07-03 DIAGNOSIS — R1312 Dysphagia, oropharyngeal phase: Secondary | ICD-10-CM

## 2016-07-03 DIAGNOSIS — I6521 Occlusion and stenosis of right carotid artery: Secondary | ICD-10-CM

## 2016-07-03 LAB — RENAL FUNCTION PANEL
Albumin: 1 g/dL — ABNORMAL LOW (ref 3.5–5.0)
Anion gap: 10 (ref 5–15)
BUN: 78 mg/dL — ABNORMAL HIGH (ref 6–20)
CHLORIDE: 95 mmol/L — AB (ref 101–111)
CO2: 27 mmol/L (ref 22–32)
CREATININE: 2.84 mg/dL — AB (ref 0.44–1.00)
Calcium: 8.8 mg/dL — ABNORMAL LOW (ref 8.9–10.3)
GFR calc non Af Amer: 20 mL/min — ABNORMAL LOW (ref >60)
GFR, EST AFRICAN AMERICAN: 23 mL/min — AB (ref >60)
GLUCOSE: 195 mg/dL — AB (ref 65–99)
Phosphorus: 2.7 mg/dL (ref 2.5–4.6)
Potassium: 4 mmol/L (ref 3.5–5.1)
Sodium: 132 mmol/L — ABNORMAL LOW (ref 135–145)

## 2016-07-03 LAB — GLUCOSE, CAPILLARY
GLUCOSE-CAPILLARY: 203 mg/dL — AB (ref 65–99)
GLUCOSE-CAPILLARY: 177 mg/dL — AB (ref 65–99)
Glucose-Capillary: 178 mg/dL — ABNORMAL HIGH (ref 65–99)
Glucose-Capillary: 138 mg/dL — ABNORMAL HIGH (ref 65–99)
Glucose-Capillary: 159 mg/dL — ABNORMAL HIGH (ref 65–99)

## 2016-07-03 LAB — CBC
HCT: 23.1 % — ABNORMAL LOW (ref 36.0–46.0)
Hemoglobin: 7 g/dL — ABNORMAL LOW (ref 12.0–15.0)
MCH: 28.2 pg (ref 26.0–34.0)
MCHC: 30.3 g/dL (ref 30.0–36.0)
MCV: 93.1 fL (ref 78.0–100.0)
Platelets: 517 10*3/uL — ABNORMAL HIGH (ref 150–400)
RBC: 2.48 MIL/uL — AB (ref 3.87–5.11)
RDW: 18.1 % — ABNORMAL HIGH (ref 11.5–15.5)
WBC: 19.8 10*3/uL — ABNORMAL HIGH (ref 4.0–10.5)

## 2016-07-03 MED ORDER — HEPARIN SODIUM (PORCINE) 1000 UNIT/ML DIALYSIS: 20.0000 [IU]/kg | INTRAMUSCULAR | Status: DC | PRN

## 2016-07-03 MED ORDER — SODIUM CHLORIDE 0.9 % IV SOLN: 100.0000 mL | INTRAVENOUS | Status: DC | PRN

## 2016-07-03 MED ORDER — LIDOCAINE-PRILOCAINE 2.5-2.5 % EX CREA: 1.0000 "application " | TOPICAL_CREAM | CUTANEOUS | Status: DC | PRN

## 2016-07-03 MED ORDER — HYDROCODONE-ACETAMINOPHEN 5-325 MG PO TABS
ORAL_TABLET | ORAL | Status: AC
  Administered 2016-07-03: 2 via ORAL
  Filled 2016-07-03: qty 2

## 2016-07-03 MED ORDER — LIDOCAINE HCL (PF) 1 % IJ SOLN: 5.0000 mL | INTRAMUSCULAR | Status: DC | PRN

## 2016-07-03 MED ORDER — PENTAFLUOROPROP-TETRAFLUOROETH EX AERO: 1.0000 "application " | INHALATION_SPRAY | CUTANEOUS | Status: DC | PRN

## 2016-07-03 MED ORDER — HEPARIN SODIUM (PORCINE) 1000 UNIT/ML DIALYSIS: 1000.0000 [IU] | INTRAMUSCULAR | Status: DC | PRN

## 2016-07-03 MED ORDER — DARBEPOETIN ALFA 200 MCG/0.4ML IJ SOSY
PREFILLED_SYRINGE | INTRAMUSCULAR | Status: AC
  Filled 2016-07-03: qty 0.4

## 2016-07-03 NOTE — Progress Notes (Signed)
[facility] Progress Note   Subjective:  Nonverbal.  Eyes open but not tracking.  Repeat MRI with worsened acute and subacute infarcts.  EEG pending  Moans while VAC sponge being changed  Vitals:   07/02/16 2100 07/03/16 0012 07/03/16 0526 07/03/16 1008  BP: 105/61 132/63 103/67 130/68  Pulse: (!) 123 77 (!) 118 (!) 105  Resp:  18  20  Temp: 99.5 F (37.5 C) 99.3 F (37.4 C) 98.5 F (36.9 C) 100 F (37.8 C)  TempSrc: Axillary Axillary Axillary Axillary  SpO2: (!) 81% 94% 96% 100%  Weight:      Height:       Exam: Does not follow any commands Moans while wound care RN changes VAC sponge and tracks with her eyes Tachy rhythm  Other VS as noted Anteriorly lungs clear Abd with G-tube in place No LE edema; wound VAC draining serosanguinous fluids  L thigh - with sponge off wound at most upper aspect appears probe patent for several cm superiorly (? Needs sharp debridement?) Other superficial skin discoloration R medial thigh, with blackish ulcer forming  Not tracking or following any commands Dialysis Access: LUE AVG+ bruit   Inpatient medications: . aspirin  325 mg Per Tube Daily  . atorvastatin  20 mg Per Tube QPM  . chlorhexidine  15 mL Mouth Rinse BID  . collagenase   Topical Daily  . darbepoetin (ARANESP) injection - DIALYSIS  200 mcg Intravenous Q Wed-HD  . feeding supplement (PRO-STAT SUGAR FREE 64)  30 mL Per Tube QID  . FLUoxetine  20 mg Per Tube Daily  . heparin subcutaneous  5,000 Units Subcutaneous Q8H  . insulin aspart  0-20 Units Subcutaneous Q4H  . insulin glargine  35 Units Subcutaneous BID  . metoprolol tartrate  25 mg Per Tube BID  . modafinil  200 mg Oral Daily  . Zinc Oxide   Topical BID   . sodium chloride    . sodium chloride    . sodium chloride    . sodium chloride    . feeding supplement (OSMOLITE 1.5 CAL) 1,000 mL (07/02/16 1834)  . sodium chloride     sodium chloride, sodium chloride, [DISCONTINUED] acetaminophen **OR**  [DISCONTINUED] acetaminophen (TYLENOL) oral liquid 160 mg/5 mL **OR** acetaminophen, acetaminophen, Gerhardt's butt cream, glucagon (human recombinant), hydrALAZINE, HYDROcodone-acetaminophen, HYDROmorphone (DILAUDID) injection, metoprolol tartrate, ondansetron (ZOFRAN) IV   Recent Labs Lab 07/01/16 0609 07/01/16 0729 07/02/16 0242  NA 132* 130* 132*  K 4.7 4.0 4.0  CL 94* 94* 94*  CO2 24 25 27   GLUCOSE 281* 307* 221*  BUN 87* 88* 44*  CREATININE 2.95* 3.00* 1.83*  CALCIUM 9.0 8.7* 8.4*  PHOS 2.9 2.8 1.7*    Recent Labs Lab 07/01/16 0609 07/01/16 0729 07/02/16 0242  ALBUMIN 1.1* <1.0* 1.2*    Recent Labs Lab 06/30/16 2322 07/01/16 0728 07/01/16 2237 07/02/16 0242  WBC 12.4* 16.2*  --  18.8*  NEUTROABS 8.7*  --   --   --   HGB 8.8* 7.1* 8.6* 8.2*  HCT 28.6* 23.5* 28.2* 26.1*  MCV 92.6 91.8  --  92.6  PLT 457* 432*  --  455*       Component Value Date/Time   IRON 29 06/24/2016 0224   TIBC 108 (L) 06/24/2016 0224   FERRITIN 4,098 (H) 06/24/2016 0224   IRONPCTSAT 27 06/24/2016 0224   Dialysis Orders: MWF [facility]  4.5h    400/800  105.5kg  3K/2Ca  LUA AVG  Hep 4400 - Mircera  IV q 2 weeks (last given 4/25) - Calcitriol 0.82mcg PO q HD  Assessment/Recommendations: 1.  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 Nonverbal. No participation in PT at all.  Repeat MRI 5/22 with acute and subacute infarcts in the right thalamus, posterior limb internal capsule, and periatrial white matter, progressed. EEG pending  2 ESRD- Continue MWF schedule, keep SBP > 110. Cannot go back to OP HD unit in current state  Per notes currently no LTAC options    3. L post medial thigh soft-tissue wound, sp I&D x 2, was on Vanc/Zosyn which have been stopped. WBC up. Wound smells and superior aspect looks as if may need sharp debridement. 4. ID possible veg on TTE, not seen on TEE.  5. HTN cont MTP bid  BP goal 130-160 per neuro  6. Anemia of CKD - max darbe 200 / wk, no IV Fe given infection.  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    8. HLD 9. DM - per primary care team 10. MO 11. Malnutrition - very low albumin . Getting TF's. Phos low (? Refeeding) Not on any binders. If drops further will need to replace  Dispo - Awaiting palliative care consult. Sad situation all around.  Tomasa Blase PA-C [facility] Pager [phone] 07/03/2016,11:15 AM   I have seen and examined this patient and agree with plan and assessment in the above note with renal recommendations/intervention highlighted.   Skya Mccullum B,MD 07/03/2016 12:07 PM

## 2016-07-03 NOTE — Consult Note (Signed)
WOC Nurse wound follow up Wound type: NWPT dressing change Measurement:see previous visit on 5/21 Wound bed: Drainage (amount, consistency, odor)  Periwound: Dressing procedure/placement/frequency: Wound malodorous, bleeding as dressing removed.  Wound cleansed with NS, patted dry, one black sponge in wound bed to obliterate space. Drape placed. We will follow this patient and remain available to this patient, nursing, and the medical and surgical teams. WOC team will return Friday to change NWPT.    Barnett HatterMelinda Juwon Scripter, RN-C, WTA-C [facility]

## 2016-07-03 NOTE — Progress Notes (Signed)
EEG Completed; Results Pending  

## 2016-07-03 NOTE — Progress Notes (Signed)
STROKE TEAM PROGRESS NOTE   SUBJECTIVE (INTERVAL HISTORY) No family is at the bedside.  Pt neuro unchanged. Repeat EEG showed diffuse slowing and no seizure. Started provigil today for arousal.      OBJECTIVE Temp:  [98.5 F (36.9 C)-99.5 F (37.5 C)] 98.5 F (36.9 C) (05/23 0526) Pulse Rate:  [74-123] 118 (05/23 0526) Cardiac Rhythm: Sinus tachycardia (05/22 1900) Resp:  [16-20] 18 (05/23 0012) BP: (103-149)/(53-95) 103/67 (05/23 0526) SpO2:  [81 %-99 %] 96 % (05/23 0526)  CBC:   Recent Labs Lab 06/30/16 2322 07/01/16 0728 07/01/16 2237 07/02/16 0242  WBC 12.4* 16.2*  --  18.8*  NEUTROABS 8.7*  --   --   --   HGB 8.8* 7.1* 8.6* 8.2*  HCT 28.6* 23.5* 28.2* 26.1*  MCV 92.6 91.8  --  92.6  PLT 457* 432*  --  455*    Basic Metabolic Panel:   Recent Labs Lab 07/01/16 0729 07/02/16 0242  NA 130* 132*  K 4.0 4.0  CL 94* 94*  CO2 25 27  GLUCOSE 307* 221*  BUN 88* 44*  CREATININE 3.00* 1.83*  CALCIUM 8.7* 8.4*  PHOS 2.8 1.7*    Lipid Panel:     Component Value Date/Time   CHOL 160 06/08/2016 0448   TRIG 146 06/08/2016 0448   HDL 45 06/08/2016 0448   CHOLHDL 3.6 06/08/2016 0448   VLDL 29 06/08/2016 0448   LDLCALC 86 06/08/2016 0448   HgbA1c:  Lab Results  Component Value Date   HGBA1C 6.7 (H) 06/09/2016   Urine Drug Screen:     Component Value Date/Time   LABOPIA NEGATIVE 09/05/2013 1822   LABOPIA NEG 08/26/2012 1251   COCAINSCRNUR NEGATIVE 09/05/2013 1822   LABBENZ NEGATIVE 09/05/2013 1822   AMPHETMU NEGATIVE 09/05/2013 1822    Alcohol Level     Component Value Date/Time   ETH <5 07-06-2016 1735    IMAGING I have personally reviewed the radiological images below and agree with the radiology interpretations.  Ct Head Wo Contrast 06/18/2016 No acute abnormality. Age advanced atrophy and multiple bilateral infarcts as seen on prior exams. Atherosclerosis.   Ct Head Wo Contrast 07/06/16 1. No acute intracranial abnormality identified.  2.  Stable chronic basal ganglia and cerebellar hemisphere lacunar infarcts.   Mr [name] Head Wo Contrast Jul 06, 2016  MRI HEAD:  Motion degraded examination. 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 Multiple old infarcts. Moderate parenchymal brain volume loss for age.   MRA HEAD:  Moderately motion degraded examination. Slow flow versus occluded MCA, likely chronic given prior CT findings. Chronically occluded RIGHT internal carotid artery, and LEFT vertebral artery. Chronically stenotic proximal LEFT M1 segment, difficult to quantify. 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   EEG 06/08/16  EEG Abnormalities: 1) generalized irregular slow activity 2) slow PDR Clinical Interpretation: This EEG is consistent with a moderate generalized non-specific cerebral dysfunction(encephalopathy). There was no seizure or seizure predisposition recorded on this study. Please note that a normal EEG does not preclude the possibility of epilepsy.   MRI Brain Wo Contrast 07/03/2015 1. Acute and subacute infarcts in the right thalamus, posterior limb internal capsule, and periatrial white matter,  progressed from 2016/07/06. 2. Advanced remote ischemic injury.  EEG 07/03/16 This awake and drowsy EEG is abnormal due to moderate diffuse slowing of the background. Clinical Correlation of the above findings indicates diffuse cerebral dysfunction that is non-specific in etiology and can be seen with hypoxic/ischemic injury, toxic/metabolic encephalopathies, neurodegenerative disorders, or  medication effect.  The absence of epileptiform discharges does not rule out a clinical diagnosis of epilepsy.  Clinical correlation is advised.   PHYSICAL EXAM  Temp:  [98.5 F (36.9 C)-99.5 F (37.5 C)] 98.5 F (36.9 C) (05/23 0526) Pulse Rate:  [74-123] 118 (05/23 0526) Resp:  [16-20] 18 (05/23 0012) BP: (103-149)/(53-95) 103/67 (05/23 0526) SpO2:  [81 %-99 %] 96 % (05/23  0526)  General - morbid obesity, well developed, nonverbal, not following commands.  Ophthalmologic - Fundi not visualized due to noncooperation.  Cardiovascular - Regular rate and rhythm.  Neuro - eyes open, nonverbal, intermittent moaning with pain stimulation, not following commands, not tracking and not blinking to visual threat, eyes right gaze preference, slight disconjugate eyes. PERRL, positive corneal and gag. Spontaneous movement of right elbow, but not right fingers or shoulder even with pain stimulation, 0/5 RLE, and increased muscle tone on the RUE and RLE. LUE 0/5 flaccid, LLE 2/5 on pain stimulation, decreased muscle tone. Sensation, coordination and gait not tested.  ASSESSMENT/PLAN [name] is a 41 y.o. female with history of diabetes mellitus, previous stroke, morbid obesity, hypertension, congestive heart failure, end-stage renal disease, and acute subcentimeter RIGHT deep gray nuclei and RIGHT hippocampal infarcts 05/14/2016  presenting with unresponsiveness. She did not receive IV t-PA due to unknown time of onset.  Stroke:  Right MCA and right MCA/PCA infarcts, consistent with new right MCA occlusion which have been progressed from 2015. However, pt mental status change still out of proportion to the infarcts, but it is possible due to her bilateral involvement and also thalamic involvement. Current mental status may be her new baseline.  Resultant  AMS, left hemiplegia  CT head - 05/19/2016 - No acute intracranial abnormality identified.  MRI head - acute RIGHT deep gray nuclei and RIGHT hippocampal infarcts  MRA head - Slow flow versus occluded right MCA. Chronically occluded RIGHT ICA, and LEFT VA. Chronically stenotic proximal LEFT M1 segment.  MRI repeat - Acute and subacute infarcts in the right thalamus, posterior limb internal capsule, and periventricular white matter  Carotid Doppler - Left vertebral artery flow is absent. Rt ICA  occluded.  2D Echo - EF 55-60%. Possible vegetation of the left coronary cusp.  TEE - 06/30/2016 - EF 55-60%. No cardiac source of emboli identified. No thrombus no vegetation.  LDL - 86  HgbA1c - 6.7  VTE prophylaxis - subcutaneous heparin Diet NPO time specified  No antithrombotic prior to admission, now on aspirin 325 mg daily.   Patient counseled to be compliant with her antithrombotic medications  Ongoing aggressive stroke risk factor management  Therapy recommendations:  SNF  Disposition: agree with palliative care management  AMS - at least partially due to bilateral cerebral involvement and thalamic injury due to infarcts - old left brain infarct and new right brain infarcts due to new right MCA occlusion and old right ICA occlusion  MRI brain repeat - mild progression from last MRI on 06/09/2016 due to frequent hypotension   Other etiology also present - ESRD on HD, leukocytosis, ongoing wound infection  AMS still out of proportion to the infarcts  EEG 06/08/16 - generalized slowing  Add provigil for arousal  Repeat EEG - diffuse slowing and no seizure  Current mental status may be her new mental baseline  Agree with palliative care consultation and comfort care  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 However at that  time, right M2 reconstituted beyond M1 with early moyamoya type changes  However, MRA 05/18/2016 showed right MCA occlusion, progress from 08/2013, causing right MCA and MCA/PCA infarcts  Frequent hypotensive reading (SBP 90s-100s) throughout this admission  Avoid hypotension  Not candidate for any intervention  BP goal 130-160 due to right ICA and MCA occlusion  Hx of stroke  Old infarct on MRI 08/2013 - b/l BG and b/l cerebellar   Acute infarct on MRI 08/2013 - Multiple small infarcts involving both cerebral hemispheres, corpus callosum, and medulla, consistent with  embolic infarcts. - CTA showed right ICA occlusion, right MCA reconstituted at M2 with M1 region moyamoya type changes. - TEE no LAA thrombus, no PFO. LE venous doppler no DVT, LDL 130  Hypertension  Stable - low at times  BP goal 130-160  Hyperlipidemia  Home meds:  Lipitor 20 mg daily resumed in hospital  LDL 86, goal < 70  Continue statin at discharge  Diabetes  HgbA1c 6.6, goal < 7.0  Controlled  SSI  Continue current management  Other Stroke Risk Factors  Obesity, Body mass index is 45.56 kg/m., recommend weight loss, diet and exercise as appropriate   ESRD on HD  Other Active Problems  NPO - on tube feedings.  Anemia  Leukocytosis  LE soft tissue wounds  Agree with Palliative Care consult   Hospital day # 25   Neurology will sign off. Please call with questions. No neuro follow up needed at this time. Thanks for the consult.  Marvel Plan, MD PhD Stroke Neurology 07/03/2016 2:02 PM  To contact Stroke Continuity provider, please refer to [email or web address]. After hours, contact General Neurology

## 2016-07-03 NOTE — Plan of Care (Signed)
Problem: Pain Managment: Goal: General experience of comfort will improve Outcome: Progressing No s/s of pain/discomfort noted at this time   Problem: Fluid Volume: Goal: Ability to maintain a balanced intake and output will improve Outcome: Progressing HD mwf   Problem: Coping: Goal: Ability to identify strategies to decrease anxiety will improve Outcome: Progressing No s/s of any anxiety

## 2016-07-03 NOTE — Procedures (Signed)
ELECTROENCEPHALOGRAM REPORT  Date of Study: 07/03/2016  Patient's Name: [name] MRN: [id number] Date of Birth: [birth year]  Referring Provider: Dr. Marvel PlanJindong Xu  Clinical History: This is a 41 year old woman with a history of stroke, unresponsiveness.  Medications: acetaminophen (TYLENOL) tablet 650 mg  aspirin tablet 325 mg  atorvastatin (LIPITOR) tablet 20 mg  chlorhexidine (PERIDEX) 0.12 % solution 15 mL  collagenase (SANTYL) ointment  Darbepoetin Alfa (ARANESP) injection 200 mcg  feeding supplement (OSMOLITE 1.5 CAL) liquid 1,000 mL  feeding supplement (PRO-STAT SUGAR FREE 64) liquid 30 mL  FLUoxetine (PROZAC) 20 MG/5ML solution 20 mg  glucagon (human recombinant) (GLUCAGEN) injection  heparin injection 5,000 Units  hydrALAZINE (APRESOLINE) injection 10 mg  HYDROcodone-acetaminophen (NORCO/VICODIN) 5-325 MG per tablet 1-2 tablet  HYDROmorphone (DILAUDID) injection 1 mg  insulin aspart (novoLOG) injection 0-20 Units  insulin glargine (LANTUS) injection 35 Units  metoprolol (LOPRESSOR) injection 5 mg  metoprolol tartrate (LOPRESSOR) tablet 25 mg  modafinil (PROVIGIL) tablet 200 mg  ondansetron (ZOFRAN) injection 4 mg  Zinc Oxide (TRIPLE PASTE) 12.8 % ointment   Technical Summary: A multichannel digital EEG recording measured by the international 10-20 system with electrodes applied with paste and impedances below 5000 ohms performed as portable with EKG monitoring in unresponsive patient.  Hyperventilation and photic stimulation were not performed.  The digital EEG was referentially recorded, reformatted, and digitally filtered in a variety of bipolar and referential montages for optimal display.   Description: The patient is unresponsive during the recording.  There is no clear posterior dominant rhythm. The background consists of a large amount of diffuse 4-5 Hz theta and occasional diffuse 2-3 Hz delta slowing. Normal sleep architecture is not seen.    Hyperventilation and photic stimulation were not performed.  There was no focal slowing, no epileptiform discharges or electrographic seizures seen.    EKG lead showed sinus tachycardia.  Impression: This awake and drowsy EEG is abnormal due to moderate diffuse slowing of the background.  Clinical Correlation of the above findings indicates diffuse cerebral dysfunction that is non-specific in etiology and can be seen with hypoxic/ischemic injury, toxic/metabolic encephalopathies, neurodegenerative disorders, or medication effect.  The absence of epileptiform discharges does not rule out a clinical diagnosis of epilepsy.  Clinical correlation is advised.   [name], M.D.

## 2016-07-03 NOTE — Progress Notes (Signed)
Pt arrived back to room 5c17 at this time.

## 2016-07-03 NOTE — Procedures (Signed)
I have personally attended this patient's dialysis session.   TDC 400 3K bath Keeping volume even  Camille Balynthia Mila Pair, MD [facility] [phone] Pager 07/03/2016, 1:10 PM

## 2016-07-03 NOTE — Progress Notes (Signed)
Physical Therapy Wound Treatment Patient Details  Name: [name] MRN: [id number] Date of Birth: April 03, 1975  Today's Date: 07/03/2016 Time: 6599-3570 Time Calculation (min): 21 min  Subjective  Subjective: Pt non verbal  Pain Score: Pain Score: Occasional withdrawal of leg  Wound Assessment  Wound / Incision (Open or Dehisced) 07/01/16 Non-pressure wound Leg Left;Distal;Posterior large area, not requested to see by bedside nurses, found by [facility] nurse today (Active)  Dressing Type ABD;Gauze (Comment);Moist to dry 07/03/2016 11:22 AM  Dressing Changed Changed 07/03/2016 11:22 AM  Dressing Status Clean;Dry;Intact 07/03/2016 11:22 AM  Dressing Change Frequency Daily 07/03/2016 11:22 AM  Site / Wound Assessment Black;Brown;Yellow 07/03/2016 11:22 AM  % Wound base Red or Granulating 0% 07/03/2016 11:22 AM  % Wound base Yellow/Fibrinous Exudate 80% 07/03/2016 11:22 AM  % Wound base Black/Eschar 20% 07/03/2016 11:22 AM  % Wound base Other/Granulation Tissue (Comment) 0% 07/03/2016 11:22 AM  Peri-wound Assessment Induration 07/03/2016 11:22 AM  Wound Length (cm) 12 cm 07/02/2016  9:06 AM  Wound Width (cm) 12 cm 07/02/2016  9:06 AM  Wound Depth (cm) 0.7 cm 07/02/2016  9:06 AM  Margins Unattached edges (unapproximated) 07/03/2016 11:22 AM  Closure None 07/03/2016 11:22 AM  Drainage Amount Moderate 07/03/2016 11:22 AM  Drainage Description Purulent;Green 07/03/2016 11:22 AM  Non-staged Wound Description Full thickness 07/03/2016 11:22 AM  Treatment Debridement (Selective);Hydrotherapy (Pulse lavage);Packing (Saline gauze) 07/03/2016 11:22 AM   Santyl applied to wound bed prior to applying dressing.    Hydrotherapy Pulsed lavage therapy - wound location: lt posterior calf Pulsed Lavage with Suction (psi): 8 psi Pulsed Lavage with Suction - Normal Saline Used: 1000 mL Pulsed Lavage Tip: Tip with splash shield Selective Debridement Selective Debridement - Location: lt posterior calf Selective  Debridement - Tools Used: Forceps;Scissors Selective Debridement - Tissue Removed: black and yellow necrotic tissue   Wound Assessment and Plan  Wound Therapy - Assess/Plan/Recommendations Wound Therapy - Clinical Statement: Continue with removal of necrotic tissue. Wound Therapy - Functional Problem List: Bed bound. Factors Delaying/Impairing Wound Healing: Diabetes Mellitus;Immobility;Multiple medical problems;Polypharmacy;Infection - systemic/local Hydrotherapy Plan: Debridement;Dressing change;Patient/family education;Pulsatile lavage with suction Wound Therapy - Frequency: 6X / week Wound Therapy - Follow Up Recommendations: Skilled nursing facility Wound Plan: See above  Wound Therapy Goals- Improve the function of patient's integumentary system by progressing the wound(s) through the phases of wound healing (inflammation - proliferation - remodeling) by: Decrease Necrotic Tissue to: 75 Decrease Necrotic Tissue - Progress: Progressing toward goal Increase Granulation Tissue to: 25 Increase Granulation Tissue - Progress: Progressing toward goal  Goals will be updated until maximal potential achieved or discharge criteria met.  Discharge criteria: when goals achieved, discharge from hospital, MD decision/surgical intervention, no progress towards goals, refusal/missing three consecutive treatments without notification or medical reason.  GP     Shary Decamp Maycok 07/03/2016, 11:27 AM Suanne Marker PT [phone]

## 2016-07-03 NOTE — Progress Notes (Signed)
PROGRESS NOTE    [name]  WUJ:[id number] DOB: 1975-11-17 DOA: 06/10/2016 PCP: Marletta Lor, NP   Brief Narrative: [name] is a 41 y.o. female with a history of CVA, DM type 2, chronic lymphedema, HTN, chronic diastolic heart failure, depression. She presented with acute altered mental status and found to have new CVA.   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 Possibly secondary to strokes. No evidence of infection at this point. -neurology recommendations: repeat MRI appears to show worsening Acute and subacute infarcts in the right thalamus, posterior limb internal capsule, and periatrial white matter, progressed from 05/17/2016.  Acute multifocal CVAs History of CVAs Neurology evaluated and signed off initially. Re-consulted on 06/30/16 -continue aspirin -neurology recommendations -re-consulted palliative care with results of MRI with progressive disease, poor prognosis, cannot do outpatient dialysis and unable to place in LTAC  L posterior/medial thigh wound Fevers Afebrile. Blood cultures have been negative. Repeat CT without abscess. TEE without evidence of vegetations. Increased bleeding overnight. Wound care to assess. Wound vac reapplied -repeat blood cultures negative to date  ESRD on HD Dialysis MWF  Reactive  RPR t pallidum abs negative.  Morbid obesity Body mass index is 41.41 kg/m  Nutrition PEG tube -continue tube feeds -nutrition recommendations  Essential hypertension -continue metoprolol  Diabetes mellitus Hyperglycemia. Better controlled today. -continue Lantus 35u BID -continue SSI CBG (last 3)   Recent Labs  07/03/16 0351 07/03/16 0844 07/03/16 1112  GLUCAP 203* 178* 177*   Chronic diastolic heart failure Last TTE on 4/28 with EF of 55-60% with grade 1 diastolic dysfunction. EDW of 105.5kg  Leukocytosis No evidence of abscess.  Tachycardia  EKG significant for sinus tachycardia. -continue metoprolol  Anemia of chronic disease S/p 2 units of PRBC in dialysis on 06/28/2016. Received 1 unit of PRBC in dialysis (5/21)  Multiple pressure ulcers -wound care  DVT prophylaxis: heparin subq Code Status: Full code Family Communication: None at bedside Disposition Plan: LTAC or goals of care discussion as patient cannot tolerate outpatient dialysis.  Consultants:   Palliative care medicine  Neurology  Nephrology  Procedures:   Dialysis MWF  Antimicrobials:  Vancomycin  Cefepime  Zosyn   Subjective: Pt nonverbal, but blinks with questions.   Objective: Vitals:   07/02/16 2100 07/03/16 0012 07/03/16 0526 07/03/16 1008  BP: 105/61 132/63 103/67 130/68  Pulse: (!) 123 77 (!) 118 (!) 105  Resp:  18  20  Temp: 99.5 F (37.5 C) 99.3 F (37.4 C) 98.5 F (36.9 C) 100 F (37.8 C)  TempSrc: Axillary Axillary Axillary Axillary  SpO2: (!) 81% 94% 96% 100%  Weight:      Height:        Intake/Output Summary (Last 24 hours) at 07/03/16 1215 Last data filed at 07/03/16 1100  Gross per 24 hour  Intake  1150 ml  Output               75 ml  Net             1075 ml   Filed Weights   07/01/16 0719 07/01/16 1149 07/02/16 0407  Weight: 104 kg (229 lb 4.5 oz) 104 kg (229 lb 4.5 oz) 113 kg (249 lb 1.9 oz)    Examination:  General exam:  Appears calm. NAD. Nonverbal but blinks with questions. Respiratory system: Clear to auscultation. Respiratory effort normal. Cardiovascular system: S1 & S2 heard, elevated rate, normal rhythm. No murmurs. Gastrointestinal system: Abdomen is nondistended, soft and nontender. Normal bowel sounds heard. Central nervous system: Alert and unable to assess orientation. Extremities: No edema. No calf tenderness. Induration of left lateral thigh Skin: No cyanosis.  Left thigh wrapped in gauze. Multiple areas of lichenification on bilateral legs. Left thigh wound with foul smell and some necrotic appearing tissues seen. Psychiatry: Not able to assess.  Data Reviewed: I have personally reviewed following labs and imaging studies  CBC:  Recent Labs Lab 06/28/16 1233 06/29/16 0946 06/30/16 2322 07/01/16 0728 07/01/16 2237 07/02/16 0242  WBC 19.4* 14.5* 12.4* 16.2*  --  18.8*  NEUTROABS  --   --  8.7*  --   --   --   HGB 6.7* 10.4* 8.8* 7.1* 8.6* 8.2*  HCT 22.4* 32.6* 28.6* 23.5* 28.2* 26.1*  MCV 94.5 91.6 92.6 91.8  --  92.6  PLT 417* 386 457* 432*  --  455*   Basic Metabolic Panel:  Recent Labs Lab 06/27/16 0501 06/28/16 1233 07/01/16 0609 07/01/16 0729 07/02/16 0242  NA 134* 134* 132* 130* 132*  K 4.2 4.2 4.7 4.0 4.0  CL 97* 98* 94* 94* 94*  CO2 27 25 24 25 27   GLUCOSE 167* 173* 281* 307* 221*  BUN 37* 69* 87* 88* 44*  CREATININE 1.95* 2.96* 2.95* 3.00* 1.83*  CALCIUM 8.6* 8.9 9.0 8.7* 8.4*  PHOS  --  2.7 2.9 2.8 1.7*   GFR: Estimated Creatinine Clearance: 49.1 mL/min (A) (by C-G formula based on SCr of 1.83 mg/dL (H)). Liver Function Tests:  Recent Labs Lab 06/28/16 1233 07/01/16 0609 07/01/16 0729 07/02/16 0242  ALBUMIN 1.0* 1.1* <1.0* 1.2*   No results for input(s): LIPASE, AMYLASE in the last 168 hours. No results for input(s): AMMONIA in the last 168 hours. Coagulation Profile: No results for input(s): INR, PROTIME in the last 168 hours. Cardiac Enzymes: No  results for input(s): CKTOTAL, CKMB, CKMBINDEX, TROPONINI in the last 168 hours. BNP (last 3 results) No results for input(s): PROBNP in the last 8760 hours. HbA1C: No results for input(s): HGBA1C in the last 72 hours. CBG:  Recent Labs Lab 07/02/16 1949 07/02/16 2337 07/03/16 0351 07/03/16 0844 07/03/16 1112  GLUCAP 148* 198* 203* 178* 177*   Lipid Profile: No results for input(s): CHOL, HDL, LDLCALC, TRIG, CHOLHDL, LDLDIRECT in the last 72 hours. Thyroid Function Tests: No results for input(s): TSH, T4TOTAL, FREET4, T3FREE, THYROIDAB in the last 72 hours. Anemia Panel: No results for input(s): VITAMINB12, FOLATE, FERRITIN, TIBC, IRON, RETICCTPCT in the last 72 hours. Sepsis Labs: No results for input(s): PROCALCITON, LATICACIDVEN in the last 168 hours.  Recent Results (from the past 240 hour(s))  MRSA PCR Screening     Status: None   Collection Time: 06/25/16  2:26 AM  Result Value Ref Range Status   MRSA by PCR NEGATIVE NEGATIVE Final    Comment:  The GeneXpert MRSA Assay (FDA approved for NASAL specimens only), is one component of a comprehensive MRSA colonization surveillance program. It is not intended to diagnose MRSA infection nor to guide or monitor treatment for MRSA infections.   Culture, blood (routine x 2)     Status: None   Collection Time: 06/25/16  7:02 PM  Result Value Ref Range Status   Specimen Description BLOOD RIGHT HAND  Final   Special Requests IN PEDIATRIC BOTTLE Blood Culture adequate volume  Final   Culture NO GROWTH 5 DAYS  Final   Report Status 06/30/2016 FINAL  Final  Culture, blood (routine x 2)     Status: None   Collection Time: 06/25/16  7:40 PM  Result Value Ref Range Status   Specimen Description BLOOD RIGHT HAND  Final   Special Requests IN PEDIATRIC BOTTLE Blood Culture adequate volume  Final   Culture NO GROWTH 5 DAYS  Final   Report Status 06/30/2016 FINAL  Final  Culture, blood (routine x 2)     Status: None    Collection Time: 06/27/16  1:11 PM  Result Value Ref Range Status   Specimen Description BLOOD RIGHT HAND  Final   Special Requests   Final    BOTTLES DRAWN AEROBIC ONLY Blood Culture results may not be optimal due to an inadequate volume of blood received in culture bottles   Culture NO GROWTH 5 DAYS  Final   Report Status 07/02/2016 FINAL  Final  Culture, blood (routine x 2)     Status: None   Collection Time: 06/27/16  2:28 PM  Result Value Ref Range Status   Specimen Description BLOOD RIGHT WRIST  Final   Special Requests IN PEDIATRIC BOTTLE Blood Culture adequate volume  Final   Culture NO GROWTH 5 DAYS  Final   Report Status 07/02/2016 FINAL  Final    Radiology Studies: Mr Brain Limited Wo Contrast  Result Date: 07/02/2016 CLINICAL DATA:  Increased lethargy.  Altered mental status. EXAM: MRI HEAD WITHOUT CONTRAST TECHNIQUE: Multiplanar, multiecho pulse sequences of the brain and surrounding structures were obtained without intravenous contrast. COMPARISON:  05/21/2016 FINDINGS: Brain: Intensely restricted diffusion in the lateral right thalamus and neighboring internal capsule, a 12 mm area. There is less intensely restricted diffusion in the medial right thalamus, periatrial white matter, and anterior right thalamus. The extent of acute and subacute infarct has progressed from 05/12/2016. No superimposed hemorrhage. 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 Remote infarct in the upper left pons and in the left corona radiata. Generalized atrophy with asymmetric wallerian changes seen in the left cortical spinal tracts. No acute hemorrhage, hydrocephalus, or mass. Limited study as requested by clinical team. Diffusion, FLAIR, and T2 weighted imaging acquired. Vascular: No detected change in flow voids. Skull and upper cervical spine: Negative Sinuses/Orbits: Negative IMPRESSION: 1. Acute and subacute infarcts in the right thalamus, posterior limb internal capsule, and  periatrial white matter, progressed from 05/17/2016. 2. Advanced remote ischemic injury. Electronically Signed   By: Marnee SpringJonathon  Watts M.D.   On: 07/02/2016 11:30   Scheduled Meds: . aspirin  325 mg Per Tube Daily  . atorvastatin  20 mg Per Tube QPM  . chlorhexidine  15 mL Mouth Rinse BID  . collagenase   Topical Daily  . darbepoetin (ARANESP) injection - DIALYSIS  200 mcg Intravenous Q Wed-HD  . feeding supplement (PRO-STAT SUGAR FREE 64)  30 mL Per Tube QID  . FLUoxetine  20 mg Per Tube Daily  .  heparin subcutaneous  5,000 Units Subcutaneous Q8H  . insulin aspart  0-20 Units Subcutaneous Q4H  . insulin glargine  35 Units Subcutaneous BID  . metoprolol tartrate  25 mg Per Tube BID  . modafinil  200 mg Oral Daily  . Zinc Oxide   Topical BID   Continuous Infusions: . sodium chloride    . sodium chloride    . sodium chloride    . sodium chloride    . sodium chloride    . sodium chloride    . feeding supplement (OSMOLITE 1.5 CAL) 1,000 mL (07/02/16 1834)  . sodium chloride       LOS: 25 days  Standley Dakins, MD [facility] 07/03/2016, 12:15 PM Pager: [phone]  If 7PM-7AM, please contact night-coverage [email or web address] Password [name] 07/03/2016, 12:15 PM

## 2016-07-04 DIAGNOSIS — I638 Other cerebral infarction: Secondary | ICD-10-CM

## 2016-07-04 LAB — RENAL FUNCTION PANEL
Albumin: 1.2 g/dL — ABNORMAL LOW (ref 3.5–5.0)
Anion gap: 10 (ref 5–15)
BUN: 36 mg/dL — ABNORMAL HIGH (ref 6–20)
CALCIUM: 8.1 mg/dL — AB (ref 8.9–10.3)
CO2: 28 mmol/L (ref 22–32)
CREATININE: 1.59 mg/dL — AB (ref 0.44–1.00)
Chloride: 97 mmol/L — ABNORMAL LOW (ref 101–111)
GFR calc non Af Amer: 40 mL/min — ABNORMAL LOW (ref >60)
GFR, EST AFRICAN AMERICAN: 46 mL/min — AB (ref >60)
GLUCOSE: 134 mg/dL — AB (ref 65–99)
Phosphorus: 2.1 mg/dL — ABNORMAL LOW (ref 2.5–4.6)
Potassium: 4 mmol/L (ref 3.5–5.1)
SODIUM: 135 mmol/L (ref 135–145)

## 2016-07-04 LAB — GLUCOSE, CAPILLARY
GLUCOSE-CAPILLARY: 179 mg/dL — AB (ref 65–99)
Glucose-Capillary: 128 mg/dL — ABNORMAL HIGH (ref 65–99)
Glucose-Capillary: 163 mg/dL — ABNORMAL HIGH (ref 65–99)
Glucose-Capillary: 171 mg/dL — ABNORMAL HIGH (ref 65–99)
Glucose-Capillary: 186 mg/dL — ABNORMAL HIGH (ref 65–99)
Glucose-Capillary: 198 mg/dL — ABNORMAL HIGH (ref 65–99)

## 2016-07-04 LAB — CBC
HCT: 25.1 % — ABNORMAL LOW (ref 36.0–46.0)
Hemoglobin: 7.5 g/dL — ABNORMAL LOW (ref 12.0–15.0)
MCH: 28.2 pg (ref 26.0–34.0)
MCHC: 29.9 g/dL — AB (ref 30.0–36.0)
MCV: 94.4 fL (ref 78.0–100.0)
PLATELETS: 482 10*3/uL — AB (ref 150–400)
RBC: 2.66 MIL/uL — AB (ref 3.87–5.11)
RDW: 18.4 % — ABNORMAL HIGH (ref 11.5–15.5)
WBC: 18.9 10*3/uL — ABNORMAL HIGH (ref 4.0–10.5)

## 2016-07-04 MED ORDER — SODIUM CHLORIDE 0.9 % IV BOLUS (SEPSIS)
500.0000 mL | Freq: Once | INTRAVENOUS | Status: AC
  Administered 2016-07-04: 500 mL via INTRAVENOUS

## 2016-07-04 NOTE — Progress Notes (Addendum)
Physical Therapy Wound Treatment Patient Details  Name: [name] MRN: [id number] Date of Birth: January 09, 1976  Today's Date:  Time: 1150-1221 Time Calculation (min): 31 min  Subjective  Subjective: Pt non verbal Patient and Family Stated Goals: unable abd family not present  Pain Score:  FACES 6/10.    Wound Assessment  Wound / Incision (Open or Dehisced) 07/01/16 Non-pressure wound Leg Left;Distal;Posterior large area, not requested to see by bedside nurses, found by [facility] nurse today (Active)  Dressing Type ABD;Gauze (Comment);Moist to dry   2:15 PM  Dressing Changed Changed   2:15 PM  Dressing Status Clean;Dry;Intact   2:15 PM  Dressing Change Frequency Daily   2:15 PM  Site / Wound Assessment Black;Brown;Yellow   2:15 PM  % Wound base Red or Granulating 0%   2:15 PM  % Wound base Yellow/Fibrinous Exudate 80%   2:15 PM  % Wound base Black/Eschar 20%   2:15 PM  % Wound base Other/Granulation Tissue (Comment) 0%   2:15 PM  Peri-wound Assessment Induration   2:15 PM  Wound Length (cm) 12 cm 07/02/2016  9:06 AM  Wound Width (cm) 12 cm 07/02/2016  9:06 AM  Wound Depth (cm) 0.7 cm 07/02/2016  9:06 AM  Margins Unattached edges (unapproximated)   2:15 PM  Closure None   2:15 PM  Drainage Amount Moderate   2:15 PM  Drainage Description Purulent;Green   2:15 PM  Non-staged Wound Description Full thickness   2:15 PM  Treatment Debridement (Selective);Cleansed;Packing (Saline gauze);Tape changed;Hydrotherapy (Pulse lavage)   2:15 PM   Santyl applied to wound bed prior to applying dressing.    Hydrotherapy Pulsed lavage therapy - wound location: lt posterior calf Pulsed Lavage with Suction (psi): 8 psi Pulsed Lavage with Suction - Normal Saline Used: 1000 mL Pulsed Lavage Tip: Tip with splash shield Selective  Debridement Selective Debridement - Location: lt posterior calf Selective Debridement - Tools Used: Forceps;Scissors Selective Debridement - Tissue Removed: black and yellow necrotic tissue   Wound Assessment and Plan  Wound Therapy - Assess/Plan/Recommendations Wound Therapy - Clinical Statement: Continue with removal of necrotic tissue remains appropriate.   Wound Therapy - Functional Problem List: Bed bound. Factors Delaying/Impairing Wound Healing: Diabetes Mellitus;Immobility;Multiple medical problems;Polypharmacy;Infection - systemic/local Hydrotherapy Plan: Debridement;Dressing change;Patient/family education;Pulsatile lavage with suction Wound Therapy - Frequency: 6X / week Wound Therapy - Follow Up Recommendations: Skilled nursing facility Wound Plan: See above  Wound Therapy Goals- Improve the function of patient's integumentary system by progressing the wound(s) through the phases of wound healing (inflammation - proliferation - remodeling) by: Decrease Necrotic Tissue to: 75 Decrease Necrotic Tissue - Progress: Progressing toward goal Increase Granulation Tissue to: 25 Increase Granulation Tissue - Progress: Progressing toward goal  Goals will be updated until maximal potential achieved or discharge criteria met.  Discharge criteria: when goals achieved, discharge from hospital, MD decision/surgical intervention, no progress towards goals, refusal/missing three consecutive treatments without notification or medical reason.  GP     Marri Mcneff Eli Hose , 2:17 PM [name], PTA pager [phone]

## 2016-07-04 NOTE — Progress Notes (Signed)
[facility] Progress Note   Subjective:  Nonverbal.  Eyes open but not tracking.  Repeat MRI with worsened acute and subacute infarcts.  Febrile now, low BP's 80's  Vitals:   07/03/16 2100  0209  0548  1005  BP: (!) 111/40 (!) 115/50 (!) 119/49 (!) 80/37  Pulse: (!) 128 (!) 109 (!) 105 (!) 113  Resp:  18 20 (!) 22  Temp: (!) 101.1 F (38.4 C) (!) 100.8 F (38.2 C) 98.9 F (37.2 C) 99.4 F (37.4 C)  TempSrc: Axillary Axillary Axillary Axillary  SpO2: 100% 99% 99%   Weight:      Height:       Exam: Does not follow any commands  Eyes open, moans (appears in pain)w/palpation legs Tachy rhythm  Other VS as noted Anteriorly lungs clear Abd with G-tube in place w/some goopy purulent drainage No LE edema; wound VAC draining serosanguinous fluids   L thigh - when sponge off yesterday wound for VAC change at most upper aspect appeared probe patent for several cm superiorly (? Needs sharp debridement?) Other superficial skin discoloration R medial thigh, with blackish ulcer forming  Not tracking or following any commands Dialysis Access: LUE AVG+ bruit   Inpatient medications: . aspirin  325 mg Per Tube Daily  . atorvastatin  20 mg Per Tube QPM  . chlorhexidine  15 mL Mouth Rinse BID  . collagenase   Topical Daily  . darbepoetin (ARANESP) injection - DIALYSIS  200 mcg Intravenous Q Wed-HD  . feeding supplement (PRO-STAT SUGAR FREE 64)  30 mL Per Tube QID  . FLUoxetine  20 mg Per Tube Daily  . heparin subcutaneous  5,000 Units Subcutaneous Q8H  . insulin aspart  0-20 Units Subcutaneous Q4H  . insulin glargine  35 Units Subcutaneous BID  . metoprolol tartrate  25 mg Per Tube BID  . modafinil  200 mg Oral Daily  . Zinc Oxide   Topical BID   . sodium chloride    . sodium chloride    . sodium chloride    . sodium chloride    . feeding supplement (OSMOLITE 1.5 CAL) 1,000 mL ( 0600)  . sodium chloride     sodium chloride,  sodium chloride, [DISCONTINUED] acetaminophen **OR** [DISCONTINUED] acetaminophen (TYLENOL) oral liquid 160 mg/5 mL **OR** acetaminophen, acetaminophen, Gerhardt's butt cream, glucagon (human recombinant), hydrALAZINE, HYDROcodone-acetaminophen, HYDROmorphone (DILAUDID) injection, metoprolol tartrate, ondansetron (ZOFRAN) IV   Recent Labs Lab 07/02/16 0242 07/03/16 1327  0358  NA 132* 132* 135  K 4.0 4.0 4.0  CL 94* 95* 97*  CO2 27 27 28   GLUCOSE 221* 195* 134*  BUN 44* 78* 36*  CREATININE 1.83* 2.84* 1.59*  CALCIUM 8.4* 8.8* 8.1*  PHOS 1.7* 2.7 2.1*    Recent Labs Lab 07/02/16 0242 07/03/16 1327  0358  ALBUMIN 1.2* 1.0* 1.2*    Recent Labs Lab 06/30/16 2322  07/02/16 0242 07/03/16 1327  0358  WBC 12.4*  < > 18.8* 19.8* 18.9*  NEUTROABS 8.7*  --   --   --   --   HGB 8.8*  < > 8.2* 7.0* 7.5*  HCT 28.6*  < > 26.1* 23.1* 25.1*  MCV 92.6  < > 92.6 93.1 94.4  PLT 457*  < > 455* 517* 482*  < > = values in this interval not displayed.   Dialysis Orders: [facility]  4.5h    400/800  105.5kg  3K/2Ca  LUA AVG  Hep 4400 - Mircera 150mcg IV q 2 weeks (  last given 4/25) - Calcitriol 0.59mcg PO q HD  Assessment/Recommendations: 1.  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 Nonverbal. No participation in PT at all.  Repeat MRI 5/22 with acute and subacute infarcts in the right thalamus, posterior limb internal capsule, and periatrial white matter, progressed. EEG pending  2 ESRD- Continue MWF schedule, keep SBP > 110. Cannot go back to OP HD unit in current state  Per notes currently no LTAC options    3. L post medial thigh soft-tissue wound, sp I&D x 2, (was on Vanc/Zosyn which have been stopped). WBC up. Wound smells and superior aspect looks as if may need sharp debridement. Febrile again. Dr. Laural Benes is addressing.   4. ID possible veg on TTE, not seen on TEE.  5. HTN  cont MTP bid BP goal 130-160 per neuro  6. Anemia of CKD - max darbe 200 / wk, no IV Fe given infection.  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    8. HLD 9. DM - per primary care team 10. MO 11. Malnutrition - very low albumin . Getting TF's. Phos stable low 2.1 today.   Disposition - Awaiting palliative care consult. With extremely poor prognosis I would support comfort care. However, she currently is still full code and with fever and hypotension, in absence of a different directive from family, will need sepsis workup, ATB's, etc.  Dr. Laural Benes is addressing issues with sister at this time.  Camille Bal, MD [facility] [phone] Pager , 11:15 AM

## 2016-07-04 NOTE — Progress Notes (Signed)
Palliative Care Follow-up Note  Please see Palliative Care note dated 5/14. I have been in touch with this family throughout this patient's illness. At this time they are a family under an extreme amount of stress, they also have very low health literacy and require extra attention to how her condition is explained and sensitivity. They have always asked for "a little bit more time" to process and see for themselves that this is not going to get better. They also cannot understand why hemodialysis can be given in the hospital but not outside of the hospital and why in that case she would need to be in the hospital. While we want to be advocates for [name] and her suffering, her family view her being alive so her children can see her as QOL, despite her ability to interact and be completely dependent on others.   [name] has 3 young children 7, 4, and 193 yo. Her husband was at my last meeting and was clearly overwhelmed- her sister [name] tells me he has left town-he has according to her report disappeared and left the children. [name] is trying to get custody of the kids and work out those details. She has relative coming into town to help her this weekend. [name]'s mother is also now hospitalized at [name].  Since [name] is not on a ventilator and we are not in a withdrawal situation, I do not believe this is an appropriate situation to "inact" the futility policy-and in most cases we can avoid unilateral decisions that can leave families devastated and angry by not providing potentially inappropriate medical treatments or interventions and explaining to the family why interventions such as hemodialysis or antibiotics would not be beneficial, that the condition is getting worse and that the outcome is death.   I spoke at length with [name] this afternoon- she tells me she will be able to think more clearly on Saturday-I will see them over the weekend. I also reiterated that regardless of what we do  or don't do for her sister that she could die at anytime.  I will continue to follow- I think the family sees how serious this is and really just needed the time. Should [name] arrest- it would be reasonable to make a decision to not code her at that time depending on the circumstances.  This family is not requesting that we provide medically inappropriate or ineffective treatments. They are just not decisional.  Time:35 minutes Greater than 50%  of this time was spent counseling and coordinating care related to the above assessment and plan.   Linda MaltaElizabeth Saleen Peden, DO Palliative Medicine [phone]

## 2016-07-04 NOTE — Progress Notes (Signed)
[facility]:  Received a referral from Marisue IvanLiz, [facility]  Unfortunately, there are no beds available today and CSW aware.  Marisue IvanLiz stated there was a family meeting scheduled this afternoon and would let us know when to engage with the family.  Please let us know if we can be of further assistance.  Thank you, Hessie KnowsStacie Wilkinson RN, [facility] Liaison [phone]

## 2016-07-04 NOTE — Progress Notes (Signed)
Pt had a fever of 100.8 F tylenol given bed bath with tep eid water given and temp came down to 98.7 f this am  Pt moarns with each reposition medicated with dilaudid 1 mg and this seemed to help. Tolerating TF. All needed care and peg site care done. No acute distress . Will continue to monitor.

## 2016-07-04 NOTE — Ethics Note (Signed)
Ethics Consult  Initiated conversation with case manager Nicholaus BloomKelley re: involving Ethics Consultation to assist in impasse with patient's family regarding plan of care.  Initial request is that Palliative Care meet with family as follow up from Dr. Henriette CombsJohnson's meeting with family and to assist in family understanding regarding limits of beneficial care.  If this does not help with family understanding then to contact Ethics Consult service and we will set up a formal ethics consulation to discuss potential for invoking the Futility policy.  I would refer medical team to Medical Futility Policy for review at this point.   Kathlyn SacramentoHamilton, Lukus Binion E Ethics Committee  [phone]

## 2016-07-04 NOTE — Progress Notes (Addendum)
Occupational Therapy Treatment Patient Details Name: [name] MRN: [id number] DOB: Oct 25, 1975 Today's Date:     History of present illness Patient is a 41 yo female admitted 06/04/2016 with [facility].  Patient with Rt infarcts, occluded MCA, Rt ICA, and Lt vertebral artery. No verbalizations or voluntary movement.   PMH:  CVA with RLE weak, DM, obesity, chronic lymphedema, wound Rt calf, VAC on Lt thigh, HTN, CHF, depression, ESRD on HD, asthma   OT comments  Pt remains with diminished response to noxious, auditory, tactile, and visual stimuli. She did maintain gaze to make eye contact with therapist for 2 seconds twice during session. Noted response to pain in B LE but no response to pain in B UE this session. She did open eyes to loud auditory stimuli 10% of the time and maintain eyes open 50% of session. She was not able to visually track this session and vocalized moans throughout session. Skilled intervention also provided for functional positioning of L UE to improve safety and decrease risk of contracture. Plan to follow for one additional OT session to focus on family education concerning ROM and positioning needs for Verda's care next session. Noted palliative care involvement and will continue to update plan of care as appropriate.   Follow Up Recommendations  SNF;Supervision/Assistance - 24 hour    Equipment Recommendations  Other (comment) (TBD at next venue of care)    Recommendations for Other Services      Precautions / Restrictions Precautions Precautions: Fall Precaution Comments: wound vac to L inner thigh; flexiseal       Mobility Bed Mobility                  Transfers                      Balance                                           ADL either performed or assessed with clinical judgement   ADL Overall ADL's : Needs assistance/impaired                                        General ADL Comments: HR up to 123 during session. Pt requiring total assist for all basic ADL and is NPO. Pt maintaining eyes open this session. No active movement in R UE when OT raised for participation in ADL tasks. Spasticity remains in L UE and positioned such as to improve potential for functional use.      Vision   Additional Comments: Pt with eyes open 50% of session and able to sustain gaze in eye contact with therapist for 2 seconds twice. She was not able to track with therapist. With light shone toward eyes, pt with minimal response, blinking eyes at times.    Perception     Praxis      Cognition Arousal/Alertness: Lethargic Behavior During Therapy: Flat affect Overall Cognitive Status: Difficult to assess Area of Impairment: Following commands                       Following Commands:  (does not follow commands this session)       General Comments: Pt not able to verbalize or  follow commands this session. Moaning throughout. No pain response in B UE but does demonstrate pain response in B LE's. Pt potentially responding to loud auditory stimuli approximately 10% of the time by opening eyes but not localizing to source. No response noted to music, telephone ring, cold cloth, or flashlight near eyes. Approximately 2 seconds of eye contact at one point during session but unable to assess if this was purposeful.         Exercises     Shoulder Instructions       General Comments      Pertinent Vitals/ Pain       Pain Assessment: Faces Faces Pain Scale: Hurts a little bit Pain Location: LE with painful stimulation; no pain response to B hands Pain Descriptors / Indicators: Moaning Pain Intervention(s): Monitored during session;Limited activity within patient's tolerance  Home Living                                          Prior Functioning/Environment              Frequency  Min 2X/week        Progress Toward  Goals  OT Goals(current goals can now be found in the care plan section)  Progress towards OT goals: Not progressing toward goals - comment (minimally responsive)  Acute Rehab OT Goals Patient Stated Goal: none stated OT Goal Formulation: With patient Time For Goal Achievement: 06/24/16 Potential to Achieve Goals: Poor ADL Goals Additional ADL Goal #1: Pt will maintain eye opening 20% of session Additional ADL Goal #2: Pt will follow one 1 step command each session with multi-modal cues Additional ADL Goal #3: Pt will be able to track object Additional ADL Goal #4: Pt will tolerate upright sitting 15 minutes to work on sitting balance  Plan Discharge plan remains appropriate    Co-evaluation                 AM-PAC PT "6 Clicks" Daily Activity     Outcome Measure   Help from another person eating meals?: Total Help from another person taking care of personal grooming?: Total Help from another person toileting, which includes using toliet, bedpan, or urinal?: Total Help from another person bathing (including washing, rinsing, drying)?: Total Help from another person to put on and taking off regular upper body clothing?: Total Help from another person to put on and taking off regular lower body clothing?: Total 6 Click Score: 6    End of Session    OT Visit Diagnosis: Muscle weakness (generalized) (M62.81);Low vision, both eyes (H54.2);Other symptoms and signs involving the nervous system (R29.898);Other symptoms and signs involving cognitive function;Cognitive communication deficit (R41.841);Hemiplegia and hemiparesis Symptoms and signs involving cognitive functions: Cerebral infarction Hemiplegia - Right/Left:  (both it appears) Hemiplegia - dominant/non-dominant:  (both it appears) Hemiplegia - caused by: Cerebral infarction   Activity Tolerance Other (comment) (Pt with minimal participation)   Patient Left in bed;with nursing/sitter in room   Nurse Communication  Mobility status        Time: [id number] OT Time Calculation (min): 12 min  Charges: OT General Charges $OT Visit: 1 Procedure OT Treatments $Therapeutic Activity: 8-22 mins  Doristine Sectionharity A Nola Botkins, MS OTR/L  Pager: [phone]    Delonta Yohannes A Keyanah Kozicki , 5:21 PM

## 2016-07-04 NOTE — Progress Notes (Signed)
PROGRESS NOTE    [name]  OVF:[id number] DOB: 1975-10-04 DOA: 05/22/2016 PCP: Alvester Chou, NP   Brief Narrative: [name] is a 41 y.o. female with a history of CVA, DM type 2, chronic lymphedema, HTN, chronic diastolic heart failure, depression. She presented with acute altered mental status and found to have new CVA.  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 Possibly secondary to strokes. No evidence of infection at this point. -neurology recommendations: repeat MRI appears to show worsening Acute and subacute infarcts in the right thalamus, posterior limb internal capsule, and periatrial white matter, progressed from 06/06/2016.  Acute multifocal CVAs History of CVAs Neurology evaluated and signed off initially. Re-consulted on 06/30/16 -continue aspirin for secondary prevention -neurology recommendations appreciated, I spoke with [name] and he feels that this is likely her new baseline due to the areas of infarction. He tried provigil but no significant improvement in mental status or alertness.  -re-consulted palliative care with results of MRI with progressive disease, poor prognosis, cannot do outpatient dialysis and unable to place in LTAC due to insurance issues.  They also met with family 5/24: see notes.   L  posterior/medial thigh wound Fevers Afebrile. Blood cultures have been negative. Repeat CT without abscess. TEE without evidence of vegetations. Increased bleeding overnight. Wound care to assess. Wound vac reapplied -repeat blood cultures negative to date -Pt had fever overnight and I ordered another set of blood culture, suspect fever related to wound which has been deteriorating and I spoke with surgery team today and they feel that wound likely will never heal.   ESRD on HD Dialysis MWF Spoke with nephrology today and they are in agreement with pursuing comfort care   Reactive RPR t pallidum abs negative.  Morbid obesity Body mass index is 41.41 kg/m  Nutrition PEG tube -continue tube feeds -nutrition recommendations  Essential hypertension -continue metoprolol  Diabetes mellitus Hyperglycemia. Better controlled today. -continue Lantus 35u BID -continue SSI CBG (last 3)   Recent Labs   0857  1154  1711  GLUCAP 163* 179* 171*   Chronic diastolic heart failure Last TTE on 4/28 with EF of 55-60% with grade 1 diastolic dysfunction. EDW of 105.5kg  Leukocytosis No evidence of abscess but likely related leg wound which is not getting better and has foul smell.  Tachycardia  EKG significant for sinus tachycardia. -continue metoprolol  Anemia of chronic disease S/p 2 units of PRBC in dialysis on 06/28/2016. Received 1 unit of PRBC in dialysis (5/21)  Multiple pressure ulcers -continue local wound care  DVT prophylaxis: heparin subq Code Status: Full code Family Communication: family meeting today for goals of care discussions Disposition Plan: SNF options versus hospice which patient is declining.  I had long family meeting today with sister, brother and mother and explained to them the patient's current condition and the that  she likely does not have long to live.  I tried to answer as many of their questions as I could to the best of my  ability.  I have recommended comfort palliative care to focus on patient comfort and dignity at end of life.  They were against that. They said that they felt that i'm "trying to kill" their family member.  I explained some of the purpose and benefits of hospice and palliative medicine to them and I expressed that I did not feel patient would likely not have meaningful recovery from this illness and that she is groaning and moaning daily as a sign that she is likely suffering in her current condition.  They said that they need more time.  They want her to stay in the hospital indefinitely to receive dialysis in the hospital.  They are also going to meet with palliative medicine team today and discuss goals of care further. They said that they would be able to give an answer by Saturday and have other family members coming.    Consultants:   Palliative care medicine  Neurology  Nephrology  Procedures:   Dialysis MWF  Antimicrobials:  Vancomycin  Cefepime  Zosyn   Subjective: Pt constantly moaning.   Objective: Vitals:    0209  0548  1005  1400  BP: (!) 115/50 (!) 119/49 (!) 80/37 96/68  Pulse: (!) 109 (!) 105 (!) 113 (!) 124  Resp: 18 20 (!) 22 19  Temp: (!) 100.8 F (38.2 C) 98.9 F (37.2 C) 99.4 F (37.4 C) 99 F (37.2 C)  TempSrc: Axillary Axillary Axillary Axillary  SpO2: 99% 99%  99%  Weight:      Height:        Intake/Output Summary (Last 24 hours) at  1736 Last data filed at  1500  Gross per 24 hour  Intake             1260 ml  Output              250 ml  Net             1010 ml   Filed Weights   07/01/16 0719 07/01/16 1149 07/02/16 0407  Weight: 104 kg (229 lb 4.5 oz) 104 kg (229 lb 4.5 oz) 113 kg (249 lb 1.9 oz)    Examination:  General exam: Appears calm. NAD. Nonverbal but blinks with questions. Respiratory system: Clear to auscultation. Respiratory effort normal. Cardiovascular system: S1 & S2 heard,  elevated rate, normal rhythm. No murmurs. Gastrointestinal system: Abdomen is nondistended, soft and nontender. Normal bowel sounds heard. Central nervous system: Alert and unable to assess orientation. Extremities: No edema. No calf tenderness. Induration of left lateral thigh Skin: No cyanosis.  Left thigh wrapped in gauze. Multiple areas of lichenification on bilateral legs. Left thigh wound with foul smell and some necrotic appearing tissues seen. Psychiatry: Not able to assess.  Data Reviewed: I have personally reviewed following labs and imaging studies  CBC:  Recent Labs Lab 06/30/16 2322 07/01/16 0728 07/01/16 2237 07/02/16 0242 07/03/16 1327  0358  WBC 12.4* 16.2*  --  18.8* 19.8* 18.9*  NEUTROABS 8.7*  --   --   --   --   --   HGB 8.8* 7.1* 8.6* 8.2* 7.0* 7.5*  HCT 28.6* 23.5* 28.2* 26.1* 23.1* 25.1*  MCV 92.6 91.8  --  92.6 93.1 94.4  PLT 457* 432*  --  455* 517* 992*   Basic Metabolic Panel:  Recent  Labs Lab 07/01/16 0609 07/01/16 0729 07/02/16 0242 07/03/16 1327  0358  NA 132* 130* 132* 132* 135  K 4.7 4.0 4.0 4.0 4.0  CL 94* 94* 94* 95* 97*  CO2 '24 25 27 27 28  '$ GLUCOSE 281* 307* 221* 195* 134*  BUN 87* 88* 44* 78* 36*  CREATININE 2.95* 3.00* 1.83* 2.84* 1.59*  CALCIUM 9.0 8.7* 8.4* 8.8* 8.1*  PHOS 2.9 2.8 1.7* 2.7 2.1*   GFR: Estimated Creatinine Clearance: 56.5 mL/min (A) (by C-G formula based on SCr of 1.59 mg/dL (H)). Liver Function Tests:  Recent Labs Lab 07/01/16 0609 07/01/16 0729 07/02/16 0242 07/03/16 1327  0358  ALBUMIN 1.1* <1.0* 1.2* 1.0* 1.2*   No results for input(s): LIPASE, AMYLASE in the last 168 hours. No results for input(s): AMMONIA in the last 168 hours. Coagulation Profile: No results for input(s): INR, PROTIME in the last 168 hours. Cardiac Enzymes: No results for input(s): CKTOTAL, CKMB, CKMBINDEX, TROPONINI in the last 168 hours. BNP (last 3 results) No results for input(s): PROBNP in the  last 8760 hours. HbA1C: No results for input(s): HGBA1C in the last 72 hours. CBG:  Recent Labs Lab  0014  0404  0857  1154  1711  GLUCAP 186* 128* 163* 179* 171*   Lipid Profile: No results for input(s): CHOL, HDL, LDLCALC, TRIG, CHOLHDL, LDLDIRECT in the last 72 hours. Thyroid Function Tests: No results for input(s): TSH, T4TOTAL, FREET4, T3FREE, THYROIDAB in the last 72 hours. Anemia Panel: No results for input(s): VITAMINB12, FOLATE, FERRITIN, TIBC, IRON, RETICCTPCT in the last 72 hours. Sepsis Labs: No results for input(s): PROCALCITON, LATICACIDVEN in the last 168 hours.  Recent Results (from the past 240 hour(s))  MRSA PCR Screening     Status: None   Collection Time: 06/25/16  2:26 AM  Result Value Ref Range Status   MRSA by PCR NEGATIVE NEGATIVE Final    Comment:        The GeneXpert MRSA Assay (FDA approved for NASAL specimens only), is one component of a comprehensive MRSA colonization surveillance program. It is not intended to diagnose MRSA infection nor to guide or monitor treatment for MRSA infections.   Culture, blood (routine x 2)     Status: None   Collection Time: 06/25/16  7:02 PM  Result Value Ref Range Status   Specimen Description BLOOD RIGHT HAND  Final   Special Requests IN PEDIATRIC BOTTLE Blood Culture adequate volume  Final   Culture NO GROWTH 5 DAYS  Final   Report Status 06/30/2016 FINAL  Final  Culture, blood (routine x 2)     Status: None   Collection Time: 06/25/16  7:40 PM  Result Value Ref Range Status   Specimen Description BLOOD RIGHT HAND  Final   Special Requests IN PEDIATRIC BOTTLE Blood Culture adequate volume  Final   Culture NO GROWTH 5 DAYS  Final   Report Status 06/30/2016 FINAL  Final  Culture, blood (routine x 2)     Status: None   Collection Time: 06/27/16  1:11 PM  Result Value Ref Range Status   Specimen Description BLOOD RIGHT HAND  Final   Special Requests   Final     BOTTLES DRAWN AEROBIC ONLY Blood Culture results may not be optimal due to an inadequate volume of blood received in culture bottles   Culture NO GROWTH 5 DAYS  Final   Report Status 07/02/2016 FINAL  Final  Culture, blood (routine x 2)     Status: None  Collection Time: 06/27/16  2:28 PM  Result Value Ref Range Status   Specimen Description BLOOD RIGHT WRIST  Final   Special Requests IN PEDIATRIC BOTTLE Blood Culture adequate volume  Final   Culture NO GROWTH 5 DAYS  Final   Report Status 07/02/2016 FINAL  Final  Culture, blood (Routine X 2) w Reflex to ID Panel     Status: None (Preliminary result)   Collection Time:  12:36 PM  Result Value Ref Range Status   Specimen Description BLOOD RIGHT HAND  Final   Special Requests IN PEDIATRIC BOTTLE Blood Culture adequate volume  Final   Culture PENDING  Incomplete   Report Status PENDING  Incomplete    Radiology Studies: No results found. Scheduled Meds: . aspirin  325 mg Per Tube Daily  . atorvastatin  20 mg Per Tube QPM  . chlorhexidine  15 mL Mouth Rinse BID  . collagenase   Topical Daily  . darbepoetin (ARANESP) injection - DIALYSIS  200 mcg Intravenous Q Wed-HD  . feeding supplement (PRO-STAT SUGAR FREE 64)  30 mL Per Tube QID  . FLUoxetine  20 mg Per Tube Daily  . heparin subcutaneous  5,000 Units Subcutaneous Q8H  . insulin aspart  0-20 Units Subcutaneous Q4H  . insulin glargine  35 Units Subcutaneous BID  . metoprolol tartrate  25 mg Per Tube BID  . modafinil  200 mg Oral Daily  . Zinc Oxide   Topical BID   Continuous Infusions: . sodium chloride    . sodium chloride    . sodium chloride    . sodium chloride    . feeding supplement (OSMOLITE 1.5 CAL) 1,000 mL ( 0600)  . sodium chloride      LOS: 26 days  Irwin Brakeman, MD [facility] , 5:36 PM Pager: [phone]  If 7PM-7AM, please contact night-coverage [email or web address] Password [facility] , 5:36 PM

## 2016-07-04 NOTE — Progress Notes (Signed)
 11:41 AM  I spoke with patient's sister [name] her HCPOA about patient's progressive decline and poor prognosis and she wanted to come up to hospital today to have a family meeting with her mother so we can clarify goals of care.  I am recommending comfort care and residential hospice vs SNF with hospice services as best option for patient for dignity and comfort.   [name]. Johnson MD

## 2016-07-05 LAB — CBC
HCT: 22 % — ABNORMAL LOW (ref 36.0–46.0)
HCT: 22.5 % — ABNORMAL LOW (ref 36.0–46.0)
HEMOGLOBIN: 6.6 g/dL — AB (ref 12.0–15.0)
HEMOGLOBIN: 6.7 g/dL — AB (ref 12.0–15.0)
MCH: 28.3 pg (ref 26.0–34.0)
MCH: 28.4 pg (ref 26.0–34.0)
MCHC: 29.8 g/dL — ABNORMAL LOW (ref 30.0–36.0)
MCHC: 30 g/dL (ref 30.0–36.0)
MCV: 94.8 fL (ref 78.0–100.0)
MCV: 94.9 fL (ref 78.0–100.0)
Platelets: 528 10*3/uL — ABNORMAL HIGH (ref 150–400)
Platelets: 548 10*3/uL — ABNORMAL HIGH (ref 150–400)
RBC: 2.32 MIL/uL — AB (ref 3.87–5.11)
RBC: 2.37 MIL/uL — AB (ref 3.87–5.11)
RDW: 18 % — ABNORMAL HIGH (ref 11.5–15.5)
RDW: 18.1 % — ABNORMAL HIGH (ref 11.5–15.5)
WBC: 17.9 10*3/uL — AB (ref 4.0–10.5)
WBC: 20.1 10*3/uL — ABNORMAL HIGH (ref 4.0–10.5)

## 2016-07-05 LAB — GLUCOSE, CAPILLARY
GLUCOSE-CAPILLARY: 185 mg/dL — AB (ref 65–99)
GLUCOSE-CAPILLARY: 166 mg/dL — AB (ref 65–99)
GLUCOSE-CAPILLARY: 177 mg/dL — AB (ref 65–99)
Glucose-Capillary: 207 mg/dL — ABNORMAL HIGH (ref 65–99)
Glucose-Capillary: 242 mg/dL — ABNORMAL HIGH (ref 65–99)
Glucose-Capillary: 182 mg/dL — ABNORMAL HIGH (ref 65–99)

## 2016-07-05 LAB — RENAL FUNCTION PANEL
ANION GAP: 9 (ref 5–15)
Albumin: <1 g/dL — ABNORMAL LOW (ref 3.5–5.0)
BUN: 60 mg/dL — ABNORMAL HIGH (ref 6–20)
CALCIUM: 8.7 mg/dL — AB (ref 8.9–10.3)
CHLORIDE: 96 mmol/L — AB (ref 101–111)
CO2: 28 mmol/L (ref 22–32)
Creatinine, Ser: 2.44 mg/dL — ABNORMAL HIGH (ref 0.44–1.00)
GFR calc non Af Amer: 24 mL/min — ABNORMAL LOW (ref >60)
GFR, EST AFRICAN AMERICAN: 28 mL/min — AB (ref >60)
Glucose, Bld: 193 mg/dL — ABNORMAL HIGH (ref 65–99)
Phosphorus: 2.7 mg/dL (ref 2.5–4.6)
Potassium: 4.2 mmol/L (ref 3.5–5.1)
SODIUM: 133 mmol/L — AB (ref 135–145)

## 2016-07-05 LAB — PREPARE RBC (CROSSMATCH)

## 2016-07-05 MED ORDER — PIPERACILLIN-TAZOBACTAM 3.375 G IVPB
3.3750 g | Freq: Two times a day (BID) | INTRAVENOUS | Status: DC
  Administered 2016-07-06 – 2016-07-14 (×17): 3.375 g via INTRAVENOUS
  Filled 2016-07-05 (×18): qty 50

## 2016-07-05 MED ORDER — SODIUM CHLORIDE 0.9 % IV SOLN: Freq: Once | INTRAVENOUS | Status: DC

## 2016-07-05 MED ORDER — PIPERACILLIN-TAZOBACTAM 3.375 G IVPB 30 MIN
3.3750 g | Freq: Once | INTRAVENOUS | Status: AC
  Administered 2016-07-05: 3.375 g via INTRAVENOUS
  Filled 2016-07-05: qty 50

## 2016-07-05 NOTE — Consult Note (Signed)
WOC Nurse wound follow up Wound type: full thickness wound s/p debridement 06/11/16 Measurement: 17cm x 10cm x 0.5cm with tunneled area that is new at the proximal edge tracks about 1.5cm  Wound bed:50% granulation, bleeds easily; 50% yellow/brown/black necrotic tissue Drainage (amount, consistency, odor) moderate, serosanguinous, but dark in canister Periwound: intact with brawny skin changes, chronic for patient Dressing procedure/placement/frequency: [facility] let me know he discussed NPWT to this wound with surgery team this am and that since the wound was not making any progress and medically this patient is declining. She has two new areas on the right upper inner thigh, and the left distal calf that are both 100% necrotic that we will stop use of the NPWT dressing due to lack of progress most likely due to clinical status and etiology of these wounds.   Saline moist gauze used to fill the tunneled area and cover the remainder of the wound bed, topped with ABD pad, secured with tape. Orders updated for BID saline moist to moist dressings to begin on the next shift.  Continue LALM for pressure and moisture management.  Continue hydrotherapy at this time until further decisions on care can be made.  I explained to Dr. Laural BenesJohnson that if the patient has exposed tendon in the wound bed of the left calf that we many times will ask ortho to get involved, however due to the extensive medical problems and progression of her illness most likely this will not be needed.    WOC Nurse team will follow along with you for weekly wound assessments.  Please notify me of any acute changes in the wounds or any new areas of concerns [facility] MSN, RN,CWOCN, CNS [phone]

## 2016-07-05 NOTE — Care Management (Signed)
Asked per Dr Laural BenesJohnson to assist in having the Ethics Committee look into [name] case. CM called Ethics beeper number and spoke to [employer] Hamilton. After speaking with him and finding out the step that need to be taken CM called and asked Palliative Care to reach out to the family again. Dr Laural BenesJohnson updated.

## 2016-07-05 NOTE — Progress Notes (Signed)
Pharmacy Antibiotic Note  [name] is a 41 y.o. female with fever and thigh would and recently on antibiotics.  Pharmacy has been consulted for Zosyn dosing. Patient is noted with ESRD (MWF) -WBC= 17.9, tmax= 101.2  Plan: -Zosyn 3.375gm IV q12h -Will follow cultures and clinical progress    Height: 5\' 2"  (157.5 cm) Weight:  (unable to weigh due to scale broken ) IBW/kg (Calculated) : 50.1  Temp (24hrs), Avg:99.1 F (37.3 C), Min:98 F (36.7 C), Max:101.2 F (38.4 C)   Recent Labs Lab 07/01/16 0729 07/02/16 0242 07/03/16 1327  0358 07/05/16 0845 07/05/16 0900  WBC  --  18.8* 19.8* 18.9* 20.1* 17.9*  CREATININE 3.00* 1.83* 2.84* 1.59* 2.44*  --     Estimated Creatinine Clearance: 36.8 mL/min (A) (by C-G formula based on SCr of 2.44 mg/dL (H)).    Allergies  Allergen Reactions  . Azithromycin Other (See Comments)    Nose bleeding event    Zosyn 5/25>> Vanc 4/30 >> 5/19 Zosyn 4/30>> 5/2; 5/7 >> 5/19 Cefepime 5/2 >> 5/7  5/9 preHD VR 29 5/14 preHD level 23 5/17 "preHD" VR 23  4/10 wound culture: mult orgs present, abundant bacteroides ovatus beta lactamase + RPR Reactive: T Pallidum Abx neg, likely false positive RPR 4/30 BCx: ngtd 5/1 LLE abscess: mult spec present, final 5/15 BCx: ngtd 5/15 MRSA PCR: ngtd 5/17 blood x2- ngtd 5/24 blood x2- ngtd   Thank you for allowing pharmacy to be a part of this patient's care.  Harland GermanAndrew Moranda Billiot, Pharm D 07/05/2016 3:08 PM

## 2016-07-05 NOTE — Progress Notes (Signed)
PROGRESS NOTE    Teondra Newburg  PJA:[id number] DOB: 1975-02-23 DOA: 06/02/2016 PCP: Alvester Chou, NP   Brief Narrative: [name] is a 41 y.o. female with a history of CVA, DM type 2, chronic lymphedema, HTN, chronic diastolic heart failure, depression. [name] presented with acute altered mental status and found to have new CVAs.  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 Possibly secondary to strokes. No evidence of infection at this point. -neurology recommendations: repeat MRI appears to show worsening Acute and subacute infarcts in the right thalamus, posterior limb internal capsule, and periatrial white matter, progressed from 05/30/2016.  Pt continues to remain essentially nonverbal and not following any commands.   Acute multifocal CVAs History of CVAs Neurology evaluated and signed off initially. Re-consulted on 06/30/16 -continue aspirin for secondary prevention -neurology recommendations appreciated, I spoke with [name] and he feels that this is likely her new baseline due to the areas of infarction. He tried provigil but no significant improvement in mental status or alertness.  -re-consulted palliative care with results of MRI with progressive disease, poor prognosis, cannot do outpatient dialysis and unable to place in LTAC due  to insurance issues.  They also met with family 5/24: see notes.   L posterior/medial thigh wound Fevers Afebrile. Blood cultures have been negative. Repeat CT without abscess. TEE without evidence of vegetations. Increased bleeding overnight. Wound care to assess. Wound vac reapplied -repeat blood cultures negative to date -Pt had fever overnight and I ordered another set of blood culture, suspect fever related to wound which has been deteriorating and I spoke with surgery team and they said that patient would not take patient back to OR for debridement as [name] is too medically unstable and fearful that wounds will never heal.  Surgery says that wound vac could be removed and wet to dry dressings to wound could be pursued at this point as wound vac does not seem to be helping.   Given recurrent fevers, obtained blood cultures 5/24 NGTD and will restart zosyn IV per pharmacy.   ESRD on HD Dialysis MWF  Reactive RPR t pallidum abs negative.  Morbid obesity Body mass index is 41.41 kg/m  Nutrition PEG tube -continue tube feeds -nutrition recommendations  Essential hypertension -continue metoprolol  Diabetes mellitus Hyperglycemia. Better controlled today. -continue Lantus 35u BID -continue SSI CBG (last 3)   Recent Labs  07/04/16 1711 07/04/16 2354 07/05/16 0405  GLUCAP 171* 198* 242*   Chronic diastolic heart failure Last TTE on 4/28 with EF of 55-60% with grade 1 diastolic dysfunction. EDW of 105.5kg  Leukocytosis No evidence of abscess but likely related leg wound which is not getting better and has foul smell.  Tachycardia  EKG significant for sinus tachycardia. -continue metoprolol   Anemia of chronic disease S/p 2 units of PRBC in dialysis on 06/28/2016. Received 1 unit  of PRBC in dialysis (5/21) repeat 5/25.   Multiple pressure ulcers -continue local wound care  DVT prophylaxis: heparin subq Code Status: Full code Family Communication: family meeting  5/24 Disposition Plan:  TBD  I had long family meeting today with sister, brother and mother on 5/24 and explained to them the patient's current condition and the that [name] likely does not have long to live.  I tried to answer as many of their questions as I could to the best of my ability.  I have recommended comfort palliative care to focus on patient comfort and dignity at end of life.  They were against that. They said that they felt that i'm "trying to kill" their family member.  I explained some of the purpose and benefits of hospice and palliative medicine to them and I expressed that I did not feel patient would likely not have meaningful recovery from this illness and that [name] is groaning and moaning daily as a sign that [name] is likely suffering in her current condition.  They said that they need more time.  They want her to stay in the hospital indefinitely to receive dialysis in the hospital.  They are also going to meet with palliative medicine team today and discuss goals of care further. They said that they would be able to give an answer by Saturday and have other family members coming.    Consultants:   Palliative care medicine  Neurology  Nephrology  Procedures:   Dialysis MWF  Antimicrobials:  Vancomycin  Cefepime  Zosyn   Subjective: Pt nonverbal and lethargic (seen after HD)   Objective: Vitals:   07/05/16 1245 07/05/16 1300 07/05/16 1322 07/05/16 1403  BP: (!) 124/51 (!) 114/32 (!) 117/57 128/68  Pulse: 84 (!) 118 (!) 117 (!) 120  Resp: (!) 26  (!) 25 (!) 22  Temp: 98 F (36.7 C)  98.2 F (36.8 C) 99 F (37.2 C)  TempSrc: Oral  Oral Oral  SpO2:   100% 93%  Weight:      Height:        Intake/Output Summary (Last 24 hours) at 07/05/16 1501 Last data filed at 07/05/16 1322  Gross per 24 hour  Intake              438 ml  Output              -55 ml  Net              493 ml   Filed Weights   07/01/16 0719 07/01/16 1149 07/02/16 0407  Weight: 104 kg  (229 lb 4.5 oz) 104 kg (229 lb 4.5 oz) 113 kg (249 lb 1.9 oz)   Review of systems - UTO due to mental status  Examination:  General exam: Appears calm. NAD. Nonverbal but blinks with questions. Respiratory system: Clear to auscultation. Respiratory effort normal. Cardiovascular system: S1 & S2 heard, elevated rate, normal rhythm. No murmurs. Gastrointestinal system: Abdomen is nondistended, soft and nontender. Normal bowel sounds heard. Central nervous system: Alert and unable to assess orientation. Extremities:  No calf tenderness. Induration of left lateral thigh Skin: No cyanosis.  Left thigh wound vac on necrotic appearing wound. Multiple areas of lichenification on bilateral legs. Left thigh wound with foul smell and some necrotic appearing tissues seen. Psychiatry: Not able to assess.  Data Reviewed: I have personally reviewed following labs and imaging studies  CBC:  Recent Labs Lab 06/30/16 2322  07/02/16 0242 07/03/16 1327 06/24/2016 0358 07/05/16  0845 07/05/16 0900  WBC 12.4*  < > 18.8* 19.8* 18.9* 20.1* 17.9*  NEUTROABS 8.7*  --   --   --   --   --   --   HGB 8.8*  < > 8.2* 7.0* 7.5* 6.6* 6.7*  HCT 28.6*  < > 26.1* 23.1* 25.1* 22.0* 22.5*  MCV 92.6  < > 92.6 93.1 94.4 94.8 94.9  PLT 457*  < > 455* 517* 482* 548* 528*  < > = values in this interval not displayed. Basic Metabolic Panel:  Recent Labs Lab 07/01/16 0729 07/02/16 0242 07/03/16 1327 07/04/16 0358 07/05/16 0845  NA 130* 132* 132* 135 133*  K 4.0 4.0 4.0 4.0 4.2  CL 94* 94* 95* 97* 96*  CO2 _0 GLUCOSE 307* 221* 195* 134* 193*  BUN 88* 44* 78* 36* 60*  CREATININE 3.00* 1.83* 2.84* 1.59* 2.44*  CALCIUM 8.7* 8.4* 8.8* 8.1* 8.7*  PHOS 2.8 1.7* 2.7 2.1* 2.7   GFR: Estimated Creatinine Clearance: 36.8 mL/min (A) (by C-G formula based on SCr of 2.44 mg/dL (H)). Liver Function Tests:  Recent Labs Lab 07/01/16 0729 07/02/16 0242 07/03/16 1327 07/04/16 0358 07/05/16 0845  ALBUMIN  <1.0* 1.2* 1.0* 1.2* <1.0*   No results for input(s): LIPASE, AMYLASE in the last 168 hours. No results for input(s): AMMONIA in the last 168 hours. Coagulation Profile: No results for input(s): INR, PROTIME in the last 168 hours. Cardiac Enzymes: No results for input(s): CKTOTAL, CKMB, CKMBINDEX, TROPONINI in the last 168 hours. BNP (last 3 results) No results for input(s): PROBNP in the last 8760 hours. HbA1C: No results for input(s): HGBA1C in the last 72 hours. CBG:  Recent Labs Lab 07/04/16 0857 07/04/16 1154 07/04/16 1711 07/04/16 2354 07/05/16 0405  GLUCAP 163* 179* 171* 198* 242*   Lipid Profile: No results for input(s): CHOL, HDL, LDLCALC, TRIG, CHOLHDL, LDLDIRECT in the last 72 hours. Thyroid Function Tests: No results for input(s): TSH, T4TOTAL, FREET4, T3FREE, THYROIDAB in the last 72 hours. Anemia Panel: No results for input(s): VITAMINB12, FOLATE, FERRITIN, TIBC, IRON, RETICCTPCT in the last 72 hours. Sepsis Labs: No results for input(s): PROCALCITON, LATICACIDVEN in the last 168 hours.  Recent Results (from the past 240 hour(s))  Culture, blood (routine x 2)     Status: None   Collection Time: 06/25/16  7:02 PM  Result Value Ref Range Status   Specimen Description BLOOD RIGHT HAND  Final   Special Requests IN PEDIATRIC BOTTLE Blood Culture adequate volume  Final   Culture NO GROWTH 5 DAYS  Final   Report Status 06/30/2016 FINAL  Final  Culture, blood (routine x 2)     Status: None   Collection Time: 06/25/16  7:40 PM  Result Value Ref Range Status   Specimen Description BLOOD RIGHT HAND  Final   Special Requests IN PEDIATRIC BOTTLE Blood Culture adequate volume  Final   Culture NO GROWTH 5 DAYS  Final   Report Status 06/30/2016 FINAL  Final  Culture, blood (routine x 2)     Status: None   Collection Time: 06/27/16  1:11 PM  Result Value Ref Range Status   Specimen Description BLOOD RIGHT HAND  Final   Special Requests   Final    BOTTLES DRAWN AEROBIC  ONLY Blood Culture results may not be optimal due to an inadequate volume of blood received in culture bottles   Culture NO GROWTH 5 DAYS  Final   Report Status 07/02/2016 FINAL  Final  Culture, blood (routine x 2)     Status: None   Collection Time: 06/27/16  2:28 PM  Result Value Ref Range Status   Specimen Description BLOOD RIGHT WRIST  Final   Special Requests IN PEDIATRIC BOTTLE Blood Culture adequate volume  Final   Culture NO GROWTH 5 DAYS  Final   Report Status 07/02/2016 FINAL  Final  Culture, blood (Routine X 2) w Reflex to ID Panel     Status: None (Preliminary result)   Collection Time: 06/30/2016 12:36 PM  Result Value Ref Range Status   Specimen Description BLOOD RIGHT HAND  Final   Special Requests IN PEDIATRIC BOTTLE Blood Culture adequate volume  Final   Culture NO GROWTH 1 DAY  Final   Report Status PENDING  Incomplete    Radiology Studies: No results found. Scheduled Meds: . aspirin  325 mg Per Tube Daily  . atorvastatin  20 mg Per Tube QPM  . chlorhexidine  15 mL Mouth Rinse BID  . collagenase   Topical Daily  . darbepoetin (ARANESP) injection - DIALYSIS  200 mcg Intravenous Q Wed-HD  . feeding supplement (PRO-STAT SUGAR FREE 64)  30 mL Per Tube QID  . FLUoxetine  20 mg Per Tube Daily  . heparin subcutaneous  5,000 Units Subcutaneous Q8H  . insulin aspart  0-20 Units Subcutaneous Q4H  . insulin glargine  35 Units Subcutaneous BID  . metoprolol tartrate  25 mg Per Tube BID  . modafinil  200 mg Oral Daily  . Zinc Oxide   Topical BID   Continuous Infusions: . sodium chloride    . sodium chloride    . sodium chloride    . sodium chloride    . sodium chloride    . feeding supplement (OSMOLITE 1.5 CAL) 1,000 mL (06/30/2016 0600)  . sodium chloride      LOS: 27 days  Irwin Brakeman, MD [facility] 07/05/2016, 3:01 PM Pager: [phone]  If 7PM-7AM, please contact night-coverage [email or web address] Password [email or web address] 07/05/2016, 3:01 PM

## 2016-07-05 NOTE — Care Management (Signed)
CM called and updated [name] of the Ethics committee of Dr Beat[facility]ec[facility]ings notes. Plan is to revisit the need for the Ethics committee on Monday. Palliative is attempting to meet with the sister and extended family over the weekend. Dr Laural BenesJohnson updated and CM continuing to follow.

## 2016-07-05 NOTE — Progress Notes (Signed)
[facility] Progress Note   Subjective:  Nonverbal.  Eyes open but not tracking.  Repeat MRI with worsened acute and subacute infarcts.  Persistently febrile  Seen in HD Initiating dialysis Noted febrile to 101 Cannulating AVG Labs are pending   Vitals:    1852  2053 07/05/16 0100 07/05/16 0446  BP: (!) 100/59 (!) 138/92 (!) 147/87 (!) 120/55  Pulse: (!) 103 100 (!) 115 100  Resp: (!) 22 20 20 20   Temp: 99.9 F (37.7 C) 100.1 F (37.8 C) (!) 100.7 F (38.2 C) (!) 101.2 F (38.4 C)  TempSrc: Axillary Axillary Axillary Axillary  SpO2: 99% 100% 100% 100%  Weight:      Height:       Exam: VS as ntoed Does not follow any commands  Moans when repositioned or examined Tachy regular 100 Anteriorly lungs clear Abd with G-tube in place w/some purulent drainage around the exit No LE edema; wound VAC draining serosanguinous fluids . Malodorous Other superficial skin discoloration R medial thigh, with blackish ulcer forming  Does not follow commands Eyes open - will not open/shut eyes to command but ? Looks at me Dialysis Access: LUE AVG+ bruit   Inpatient medications: . aspirin  325 mg Per Tube Daily  . atorvastatin  20 mg Per Tube QPM  . chlorhexidine  15 mL Mouth Rinse BID  . collagenase   Topical Daily  . darbepoetin (ARANESP) injection - DIALYSIS  200 mcg Intravenous Q Wed-HD  . feeding supplement (PRO-STAT SUGAR FREE 64)  30 mL Per Tube QID  . FLUoxetine  20 mg Per Tube Daily  . heparin subcutaneous  5,000 Units Subcutaneous Q8H  . insulin aspart  0-20 Units Subcutaneous Q4H  . insulin glargine  35 Units Subcutaneous BID  . metoprolol tartrate  25 mg Per Tube BID  . modafinil  200 mg Oral Daily  . Zinc Oxide   Topical BID   . sodium chloride    . sodium chloride    . sodium chloride    . sodium chloride    . feeding supplement (OSMOLITE 1.5 CAL) 1,000 mL ( 0600)  . sodium chloride     sodium chloride, sodium chloride,  [DISCONTINUED] acetaminophen **OR** [DISCONTINUED] acetaminophen (TYLENOL) oral liquid 160 mg/5 mL **OR** acetaminophen, acetaminophen, Gerhardt's butt cream, glucagon (human recombinant), hydrALAZINE, HYDROcodone-acetaminophen, HYDROmorphone (DILAUDID) injection, metoprolol tartrate, ondansetron (ZOFRAN) IV   Recent Labs Lab 07/02/16 0242 07/03/16 1327  0358  NA 132* 132* 135  K 4.0 4.0 4.0  CL 94* 95* 97*  CO2 27 27 28   GLUCOSE 221* 195* 134*  BUN 44* 78* 36*  CREATININE 1.83* 2.84* 1.59*  CALCIUM 8.4* 8.8* 8.1*  PHOS 1.7* 2.7 2.1*    Recent Labs Lab 07/02/16 0242 07/03/16 1327  0358  ALBUMIN 1.2* 1.0* 1.2*    Recent Labs Lab 06/30/16 2322  07/02/16 0242 07/03/16 1327  0358  WBC 12.4*  < > 18.8* 19.8* 18.9*  NEUTROABS 8.7*  --   --   --   --   HGB 8.8*  < > 8.2* 7.0* 7.5*  HCT 28.6*  < > 26.1* 23.1* 25.1*  MCV 92.6  < > 92.6 93.1 94.4  PLT 457*  < > 455* 517* 482*  < > = values in this interval not displayed.   Dialysis Orders: MWF [facility]  4.5h    400/800  105.5kg  3K/2Ca  LUA AVG  Hep 4400 - Mircera IV q 2 weeks (last given 4/25) -  Calcitriol 0.4925mcg PO q HD  Assessment/Recommendations: 1. 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 Nonverbal. No participation in PT at all.  Repeat MRI 5/22 with acute and subacute infarcts in the right thalamus, posterior limb internal capsule, and periatrial white matter, progressed. EEG non-specific. Neuro feels this is likely her new baseline. 2. ESRD- Continuing MWF schedule. Outpt HD no longer an option. Per notes currently no LTAC options. Given very poor prognosis for meaningful recovery I would favor comfort care but family not in agreement with that approach at this time.   3. L post medial thigh soft-tissue wound, sp I&D x 2, (was on Vanc/Zosyn which have been stopped). WBC up. Wound smells and superior aspect  looks as if may need sharp debridement. Surgeons have stated they feel "wuill never heal" Febrile again. Blood cultures were done 5/24 but no ATB's restarted as of yet.   4. HTN cont MTP bid BP goal 130-160 per neuro  5. Anemia of CKD - max darbe 200 / wk, no IV Fe given infection.  6. MBD w/ hyperCa++ >> no vit D/ vdra. Corrected Ca nl, phos low   7. HLD 8. DM - per primary care team 9. Malnutrition - very low albumin . Getting TF's. Phos stable low 2's 10. Disposition - Palliative Care (Dr. Phillips OdorGolding) is involved and continues to meet with family members who are unable to make decisions about de-escalation of care and sounds like entire family in upheaval as husband has left town, left the children, custody is being pursued (and her Mom is still in the hospital here herself). She does not feel enactment of futility policy appropriate at this time. Pt remains full code.   Linda Balynthia Shannon Balthazar, MD [facility] [phone] Pager 07/05/2016, 8:13 AM

## 2016-07-05 NOTE — Progress Notes (Addendum)
Physical Therapy Wound Treatment Patient Details  Name: [name] MRN: [id number] Date of Birth: 19-Aug-1975  Today's Date: 07/05/2016 Time: 1400-1446 Time Calculation (min): 46 min  Subjective  Subjective: Pt non verbal, did mumble, " that hurts." Patient and Family Stated Goals: unable abd family not present  Pain Score: Pain Score: 0-No pain  Wound Assessment  Wound / Incision (Open or Dehisced) 07/01/16 Non-pressure wound Leg Left;Distal;Posterior large area, not requested to see by bedside nurses, found by [facility] nurse today (Active)  Dressing Type ABD;Gauze (Comment);Moist to dry 07/05/2016  3:45 PM  Dressing Changed Changed 07/05/2016  3:45 PM  Dressing Status Clean;Dry;Intact 07/05/2016  3:45 PM  Dressing Change Frequency Daily 07/05/2016  3:45 PM  Site / Wound Assessment Black;Brown;Yellow 07/05/2016  3:45 PM  % Wound base Red or Granulating 0% 07/05/2016  3:45 PM  % Wound base Yellow/Fibrinous Exudate 80% 07/05/2016  3:45 PM  % Wound base Black/Eschar 20% 07/05/2016  3:45 PM  % Wound base Other/Granulation Tissue (Comment) 0% 07/05/2016  3:45 PM  Peri-wound Assessment Induration 07/05/2016  3:45 PM  Wound Length (cm) 12 cm 07/02/2016  9:06 AM  Wound Width (cm) 12 cm 07/02/2016  9:06 AM  Wound Depth (cm) 0.7 cm 07/02/2016  9:06 AM  Margins Unattached edges (unapproximated) 07/05/2016  3:45 PM  Closure None 07/05/2016  3:45 PM  Drainage Amount Moderate 07/05/2016  3:45 PM  Drainage Description Purulent;Green;Odor 07/05/2016  3:45 PM  Non-staged Wound Description Full thickness 07/05/2016  3:45 PM  Treatment Cleansed;Debridement (Selective);Hydrotherapy (Pulse lavage);Packing (Saline gauze);Tape changed 07/05/2016  3:45 PM         Hydrotherapy Pulsed lavage therapy - wound location: lt posterior calf Pulsed Lavage with Suction (psi): 8 psi Pulsed Lavage with Suction - Normal Saline Used: 1000 mL Pulsed Lavage Tip: Tip with splash shield Selective Debridement Selective  Debridement - Location: lt posterior calf Selective Debridement - Tools Used: Forceps;Scissors Selective Debridement - Tissue Removed: black and yellow necrotic tissue   Wound Assessment and Plan  Wound Therapy - Assess/Plan/Recommendations Wound Therapy - Clinical Statement: Able to remove minimal necrotic tissue during session.  Santyl not available and RN reports she will re-order for next session.  Spoke with [facility] nurse after session who is going to d/c hydro for the weekend and re-evaluate for Monday or Tuesday.  Will sign off on patient at this time and await new orders if appropriate.   Wound Therapy - Functional Problem List: Bed bound. Factors Delaying/Impairing Wound Healing: Diabetes Mellitus;Immobility;Multiple medical problems;Polypharmacy;Infection - systemic/local Hydrotherapy Plan: Debridement;Dressing change;Patient/family education;Pulsatile lavage with suction Wound Therapy - Frequency: 6X / week Wound Therapy - Follow Up Recommendations: Skilled nursing facility Wound Plan: See above  Wound Therapy Goals- Improve the function of patient's integumentary system by progressing the wound(s) through the phases of wound healing (inflammation - proliferation - remodeling) by: Decrease Necrotic Tissue to: 75 Decrease Necrotic Tissue - Progress: Progressing toward goal Increase Granulation Tissue to: 25 Increase Granulation Tissue - Progress: Progressing toward goal  Goals will be updated until maximal potential achieved or discharge criteria met.  Discharge criteria: when goals achieved, discharge from hospital, MD decision/surgical intervention, no progress towards goals, refusal/missing three consecutive treatments without notification or medical reason.  GP     [name] 07/05/2016, 3:49 PM [name], PTA pager [phone]

## 2016-07-06 LAB — BPAM RBC
BLOOD PRODUCT EXPIRATION DATE: 201806012359
Blood Product Expiration Date: 201806012359
ISSUE DATE / TIME: 201805251104
ISSUE DATE / TIME: 201805251104
UNIT TYPE AND RH: 600
Unit Type and Rh: 600

## 2016-07-06 LAB — TYPE AND SCREEN
ABO/RH(D): A POS
Antibody Screen: NEGATIVE
Unit division: 0
Unit division: 0

## 2016-07-06 LAB — COMPREHENSIVE METABOLIC PANEL
ALT: 21 U/L (ref 14–54)
ANION GAP: 10 (ref 5–15)
AST: 25 U/L (ref 15–41)
Albumin: 1.2 g/dL — ABNORMAL LOW (ref 3.5–5.0)
Alkaline Phosphatase: 243 U/L — ABNORMAL HIGH (ref 38–126)
BUN: 37 mg/dL — ABNORMAL HIGH (ref 6–20)
CHLORIDE: 97 mmol/L — AB (ref 101–111)
CO2: 27 mmol/L (ref 22–32)
CREATININE: 1.58 mg/dL — AB (ref 0.44–1.00)
Calcium: 8.5 mg/dL — ABNORMAL LOW (ref 8.9–10.3)
GFR, EST AFRICAN AMERICAN: 47 mL/min — AB (ref >60)
GFR, EST NON AFRICAN AMERICAN: 40 mL/min — AB (ref >60)
Glucose, Bld: 206 mg/dL — ABNORMAL HIGH (ref 65–99)
POTASSIUM: 4.2 mmol/L (ref 3.5–5.1)
Sodium: 134 mmol/L — ABNORMAL LOW (ref 135–145)
Total Bilirubin: 0.5 mg/dL (ref 0.3–1.2)
Total Protein: 6.7 g/dL (ref 6.5–8.1)

## 2016-07-06 LAB — GLUCOSE, CAPILLARY
GLUCOSE-CAPILLARY: 176 mg/dL — AB (ref 65–99)
GLUCOSE-CAPILLARY: 177 mg/dL — AB (ref 65–99)
GLUCOSE-CAPILLARY: 182 mg/dL — AB (ref 65–99)
GLUCOSE-CAPILLARY: 186 mg/dL — AB (ref 65–99)
Glucose-Capillary: 175 mg/dL — ABNORMAL HIGH (ref 65–99)
Glucose-Capillary: 196 mg/dL — ABNORMAL HIGH (ref 65–99)
Glucose-Capillary: 159 mg/dL — ABNORMAL HIGH (ref 65–99)

## 2016-07-06 LAB — CBC WITH DIFFERENTIAL/PLATELET
Basophils Absolute: 0 10*3/uL (ref 0.0–0.1)
Basophils Relative: 0 %
EOS ABS: 0.4 10*3/uL (ref 0.0–0.7)
EOS PCT: 2 %
HCT: 29.4 % — ABNORMAL LOW (ref 36.0–46.0)
Hemoglobin: 9.3 g/dL — ABNORMAL LOW (ref 12.0–15.0)
LYMPHS ABS: 3.1 10*3/uL (ref 0.7–4.0)
Lymphocytes Relative: 16 %
MCH: 29.8 pg (ref 26.0–34.0)
MCHC: 31.6 g/dL (ref 30.0–36.0)
MCV: 94.2 fL (ref 78.0–100.0)
MONO ABS: 1.2 10*3/uL — AB (ref 0.1–1.0)
Monocytes Relative: 6 %
Neutro Abs: 14.2 10*3/uL — ABNORMAL HIGH (ref 1.7–7.7)
Neutrophils Relative %: 75 %
PLATELETS: 491 10*3/uL — AB (ref 150–400)
RBC: 3.12 MIL/uL — AB (ref 3.87–5.11)
RDW: 18.5 % — AB (ref 11.5–15.5)
WBC: 18.9 10*3/uL — AB (ref 4.0–10.5)

## 2016-07-06 NOTE — Progress Notes (Signed)
[facility] Progress Note   Subjective:  Nonverbal.  Eyes open but not tracking.  Persistently febrile Had HD yesterday Wound care indicates there is new area of tunneling in the wound and wound VAC not helping  Vitals:   07/06/16 0423 07/06/16 0614 07/06/16 0615 07/06/16 0950  BP: (!) 129/96   (!) 105/59  Pulse: (!) 108   (!) 111  Resp: 20   20  Temp: 99.3 F (37.4 C) 99 F (37.2 C) 99.3 F (37.4 C) 99.1 F (37.3 C)  TempSrc: Axillary Axillary Oral Axillary  SpO2: 100%   100%  Weight:      Height:       Exam: VS as above Does not follow any commands  Moans when repositioned or examined Anteriorly lungs clear Abd with G-tube in place w/some purulent drainage around the exit Largest left leg wound malodorous/necrotic Other superficial skin discoloration R medial thigh, with blackish ulcer forming  Eyes open - will not open/shut eyes to command  Dialysis Access: LUE AVG+ bruit   Inpatient medications: . aspirin  325 mg Per Tube Daily  . atorvastatin  20 mg Per Tube QPM  . chlorhexidine  15 mL Mouth Rinse BID  . collagenase   Topical Daily  . darbepoetin (ARANESP) injection - DIALYSIS  200 mcg Intravenous Q Wed-HD  . feeding supplement (PRO-STAT SUGAR FREE 64)  30 mL Per Tube QID  . FLUoxetine  20 mg Per Tube Daily  . heparin subcutaneous  5,000 Units Subcutaneous Q8H  . insulin aspart  0-20 Units Subcutaneous Q4H  . insulin glargine  35 Units Subcutaneous BID  . metoprolol tartrate  25 mg Per Tube BID  . modafinil  200 mg Oral Daily  . Zinc Oxide   Topical BID   . feeding supplement (OSMOLITE 1.5 CAL) 1,000 mL (07/05/16 1911)  . piperacillin-tazobactam (ZOSYN)  IV Stopped (07/06/16 0921)  . sodium chloride     [DISCONTINUED] acetaminophen **OR** [DISCONTINUED] acetaminophen (TYLENOL) oral liquid 160 mg/5 mL **OR** acetaminophen, acetaminophen, Gerhardt's butt cream, glucagon (human recombinant), hydrALAZINE, HYDROcodone-acetaminophen,  HYDROmorphone (DILAUDID) injection, metoprolol tartrate, ondansetron (ZOFRAN) IV   Recent Labs Lab 07/03/16 1327  0358 07/05/16 0845 07/06/16 0234  NA 132* 135 133* 134*  K 4.0 4.0 4.2 4.2  CL 95* 97* 96* 97*  CO2 27 28 28 27   GLUCOSE 195* 134* 193* 206*  BUN 78* 36* 60* 37*  CREATININE 2.84* 1.59* 2.44* 1.58*  CALCIUM 8.8* 8.1* 8.7* 8.5*  PHOS 2.7 2.1* 2.7  --     Recent Labs Lab  0358 07/05/16 0845 07/06/16 0234  AST  --   --  25  ALT  --   --  21  ALKPHOS  --   --  243*  BILITOT  --   --  0.5  PROT  --   --  6.7  ALBUMIN 1.2* <1.0* 1.2*    Recent Labs Lab 06/30/16 2322  07/05/16 0845 07/05/16 0900 07/06/16 0234  WBC 12.4*  < > 20.1* 17.9* 18.9*  NEUTROABS 8.7*  --   --   --  14.2*  HGB 8.8*  < > 6.6* 6.7* 9.3*  HCT 28.6*  < > 22.0* 22.5* 29.4*  MCV 92.6  < > 94.8 94.9 94.2  PLT 457*  < > 548* 528* 491*  < > = values in this interval not displayed.   Dialysis Orders: [facility]WF [facility]  4.5h    400/800  105.5kg  3K/2Ca  LUA AVG  Hep 4400 -  Mircera IV q 2 weeks (last given 4/25) - Calcitriol 0.89mcg PO q HD  Assessment/Recommendations: 1. 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 Nonverbal. No participation in PT at all.  Repeat MRI 5/22 with acute and subacute infarcts in the right thalamus, posterior limb internal capsule, and periatrial white matter, progressed. EEG non-specific. Neuro feels this is likely her new baseline. 2. ESRD- Continuing MWF schedule. Outpt HD no longer an option. Per notes currently no local LTAC options. Given very poor prognosis for meaningful recovery I would favor comfort care but family not in agreement with that approach at this time.   3. L post medial thigh soft-tissue wound, sp I&D x 2, (was on Vanc/Zosyn which have been stopped). WBC up. Wound smells and superior aspect looks as if may need sharp debridement. Surgeons have  stated they feel "wuill never heal" Febrile again. Blood cultures were done 5/24 and zosyn restarted 5.25  4. HTN cont MTP bid BP goal 130-160 per neuro  5. Anemia of CKD - max darbe 200 / wk, no IV Fe given infection.  6. MBD w/ hyperCa++ >> no vit D/ vdra. Corrected Ca nl, phos low   7. HLD 8. DM - per primary care team 9. Malnutrition - very low albumin . Getting TF's. Phos stable low 2's 10. Disposition - Palliative Care (Dr. Phillips Odor) is involved and continues to meet with family members who are unable to make decisions about de-escalation of care and sounds like entire family in upheaval as husband has left town, left the children, custody is being pursued. I did speak with her mother for some time yesterday, and I phrased situation to her that "she cannot get well from all of this, her brain is badly injured from all the strokes, and the leg wound is rotting" and I advised that I feel we are prolonging her suffering at this time. She seemed receptive of my opinion, but I'm not sure given 24 hours to think on it how she will feel today. I believe there are to be more discussions today.  Camille Bal, MD [facility] [phone] Pager 07/06/2016, 11:40 AM

## 2016-07-06 NOTE — Progress Notes (Signed)
PROGRESS NOTE    [name]  XLK:[id number] DOB: 08/10/1975 DOA: 06/06/2016 PCP: Marletta Lor, NP   Brief Narrative: [name] is a 41 y.o. female with a history of CVA, DM type 2, chronic lymphedema, HTN, chronic diastolic heart failure, depression. She presented with acute altered mental status and found to have new CVAs.  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 Possibly secondary to strokes. No evidence of infection at this point. -neurology recommendations: repeated MRI appears to show worsening Acute and subacute infarcts in the right thalamus, posterior limb internal capsule, and periatrial white matter, progressed from 05/17/2016.  Pt continues to remain essentially nonverbal and not following any commands.   Acute multifocal CVAs History of CVAs Neurology evaluated and signed off initially. Re-consulted on 06/30/16 -continue aspirin for secondary prevention -neurology recommendations appreciated, I spoke with Dr. Roda Shutters and he feels that this is likely her new baseline due to the areas of infarction. He tried provigil but no significant improvement in mental status or alertness.  -re-consulted palliative care with results of MRI with progressive disease, poor prognosis, cannot do outpatient dialysis and unable to place in LTAC due  to insurance issues.  They also met with family 5/24: see notes and plan to meet with family 5/26 for further discussions.   L posterior/medial thigh wound Fevers Afebrile. Blood cultures have been negative. Repeat CT without abscess. TEE without evidence of vegetations. Increased bleeding overnight. Wound care to assess. Wound vac reapplied -repeat blood cultures negative to date -Pt had fever overnight and I ordered another set of blood culture, suspect fever related to wound which has been deteriorating and I spoke with surgery team and they said that patient would not take patient back to OR for debridement as she is too medically unstable and fearful that wounds will never heal.  Surgery says that wound vac could be removed and wet to dry dressings to wound could be pursued at this point as wound vac does not seem to be helping.   Given recurrent fevers, obtained blood cultures 5/24 NGTD and restarted zosyn IV per pharmacy 5/25.   ESRD on HD Dialysis MWF  Reactive RPR t pallidum abs negative.  Morbid obesity Body mass index is 41.41 kg/m  Nutrition PEG tube -continue tube feeds -nutrition recommendations  Essential hypertension -continue metoprolol  Diabetes mellitus Hyperglycemia. Better controlled today. -continue Lantus 35u BID -continue SSI CBG (last 3)   Recent Labs  07/06/16 0337 07/06/16 0414 07/06/16 0757  GLUCAP 177* 176* 175*   Chronic diastolic heart failure Last TTE on 4/28 with EF of 55-60% with grade 1 diastolic dysfunction. EDW of 105.5kg  Leukocytosis No evidence of abscess but likely related leg wound which is not getting better and has foul smell.  Tachycardia  EKG significant for sinus tachycardia. -continue metoprolol   Anemia of chronic disease S/p 2  units of PRBC in dialysis on 06/28/2016. Received 1 unit of PRBC in dialysis (5/21) repeat 5/25.   Multiple pressure ulcers -continue local wound care  DVT prophylaxis: heparin subq Code  Status: Full code Family Communication: family meeting 5/24 Disposition Plan:  TBD  I had long family meeting today with sister, brother and mother on 5/24 and explained to them the patient's current condition and the that she likely does not have long to live.  I tried to answer as many of their questions as I could to the best of my ability.  I have recommended comfort palliative care to focus on patient comfort and dignity at end of life.  They were against that. They said that they felt that i'm "trying to kill" their family member.  I explained some of the purpose and benefits of hospice and palliative medicine to them and I expressed that I did not feel patient would likely not have meaningful recovery from this illness and that she is groaning and moaning daily as a sign that she is likely suffering in her current condition.  They said that they need more time.  They want her to stay in the hospital indefinitely to receive dialysis in the hospital.  They are also going to meet with palliative medicine team today and discuss goals of care further. They said that they would be able to give an answer by Saturday and have other family members coming.    Consultants:   Palliative care medicine  Neurology  Nephrology  Procedures:   Dialysis MWF  Antimicrobials:  Vancomycin  Cefepime  Zosyn   Subjective: Pt nonverbal and not tracking eyes today, has persistent rightward gaze   Objective: Vitals:   07/06/16 0423 07/06/16 0614 07/06/16 0615 07/06/16 0950  BP: (!) 129/96   (!) 105/59  Pulse: (!) 108   (!) 111  Resp: 20   20  Temp: 99.3 F (37.4 C) 99 F (37.2 C) 99.3 F (37.4 C) 99.1 F (37.3 C)  TempSrc: Axillary Axillary Oral Axillary  SpO2: 100%   100%  Weight:      Height:        Intake/Output Summary (Last 24 hours) at 07/06/16 1110 Last data filed at 07/06/16 0513  Gross per 24 hour  Intake          2202.17 ml  Output              -55 ml  Net          2257.17 ml     Filed Weights   07/01/16 0719 07/01/16 1149 07/02/16 0407  Weight: 104 kg (229 lb 4.5 oz) 104 kg (229 lb 4.5 oz) 113 kg (249 lb 1.9 oz)   Review of systems - UTO due to mental status  Examination:  General exam: Appears calm. NAD. Nonverbal but blinks with questions. Respiratory system: Clear to auscultation. Respiratory effort normal. Cardiovascular system: S1 & S2 heard, elevated rate, normal rhythm. No murmurs. Gastrointestinal system: Abdomen is nondistended, soft and nontender. Normal bowel sounds heard. Central nervous system: nonresponsive, persistent rightward gaze.   Extremities:  No calf tenderness. Induration of left lateral thigh Skin: No cyanosis.  Left thigh necrotic wound and multiple other areas of skin breakdown noted.   Psychiatry: Not able to assess.  Data Reviewed: I have personally reviewed following labs and imaging studies  CBC:  Recent Labs Lab 06/30/16 2322  07/03/16 1327  0358 07/05/16 0845 07/05/16 0900 07/06/16 0234  WBC 12.4*  < >  19.8* 18.9* 20.1* 17.9* 18.9*  NEUTROABS 8.7*  --   --   --   --   --  14.2*  HGB 8.8*  < > 7.0* 7.5* 6.6* 6.7* 9.3*  HCT 28.6*  < > 23.1* 25.1* 22.0* 22.5* 29.4*  MCV 92.6  < > 93.1 94.4 94.8 94.9 94.2  PLT 457*  < > 517* 482* 548* 528* 491*  < > = values in this interval not displayed. Basic Metabolic Panel:  Recent Labs Lab 07/01/16 0729 07/02/16 0242 07/03/16 1327  0358 07/05/16 0845 07/06/16 0234  NA 130* 132* 132* 135 133* 134*  K 4.0 4.0 4.0 4.0 4.2 4.2  CL 94* 94* 95* 97* 96* 97*  CO2 '25 27 27 28 28 27  '$ GLUCOSE 307* 221* 195* 134* 193* 206*  BUN 88* 44* 78* 36* 60* 37*  CREATININE 3.00* 1.83* 2.84* 1.59* 2.44* 1.58*  CALCIUM 8.7* 8.4* 8.8* 8.1* 8.7* 8.5*  PHOS 2.8 1.7* 2.7 2.1* 2.7  --    GFR: Estimated Creatinine Clearance: 56.8 mL/min (A) (by C-G formula based on SCr of 1.58 mg/dL (H)). Liver Function Tests:  Recent Labs Lab 07/02/16 0242 07/03/16 1327  0358  07/05/16 0845 07/06/16 0234  AST  --   --   --   --  25  ALT  --   --   --   --  21  ALKPHOS  --   --   --   --  243*  BILITOT  --   --   --   --  0.5  PROT  --   --   --   --  6.7  ALBUMIN 1.2* 1.0* 1.2* <1.0* 1.2*   No results for input(s): LIPASE, AMYLASE in the last 168 hours. No results for input(s): AMMONIA in the last 168 hours. Coagulation Profile: No results for input(s): INR, PROTIME in the last 168 hours. Cardiac Enzymes: No results for input(s): CKTOTAL, CKMB, CKMBINDEX, TROPONINI in the last 168 hours. BNP (last 3 results) No results for input(s): PROBNP in the last 8760 hours. HbA1C: No results for input(s): HGBA1C in the last 72 hours. CBG:  Recent Labs Lab 07/05/16 1945 07/06/16 0007 07/06/16 0337 07/06/16 0414 07/06/16 0757  GLUCAP 177* 182* 177* 176* 175*   Lipid Profile: No results for input(s): CHOL, HDL, LDLCALC, TRIG, CHOLHDL, LDLDIRECT in the last 72 hours. Thyroid Function Tests: No results for input(s): TSH, T4TOTAL, FREET4, T3FREE, THYROIDAB in the last 72 hours. Anemia Panel: No results for input(s): VITAMINB12, FOLATE, FERRITIN, TIBC, IRON, RETICCTPCT in the last 72 hours. Sepsis Labs: No results for input(s): PROCALCITON, LATICACIDVEN in the last 168 hours.  Recent Results (from the past 240 hour(s))  Culture, blood (routine x 2)     Status: None   Collection Time: 06/27/16  1:11 PM  Result Value Ref Range Status   Specimen Description BLOOD RIGHT HAND  Final   Special Requests   Final    BOTTLES DRAWN AEROBIC ONLY Blood Culture results may not be optimal due to an inadequate volume of blood received in culture bottles   Culture NO GROWTH 5 DAYS  Final   Report Status 07/02/2016 FINAL  Final  Culture, blood (routine x 2)     Status: None   Collection Time: 06/27/16  2:28 PM  Result Value Ref Range Status   Specimen Description BLOOD RIGHT WRIST  Final   Special Requests IN PEDIATRIC BOTTLE Blood Culture adequate volume  Final    Culture NO GROWTH  5 DAYS  Final   Report Status 07/02/2016 FINAL  Final  Culture, blood (Routine X 2) w Reflex to ID Panel     Status: None (Preliminary result)   Collection Time:  12:36 PM  Result Value Ref Range Status   Specimen Description BLOOD RIGHT HAND  Final   Special Requests IN PEDIATRIC BOTTLE Blood Culture adequate volume  Final   Culture NO GROWTH 1 DAY  Final   Report Status PENDING  Incomplete  Culture, blood (Routine X 2) w Reflex to ID Panel     Status: None (Preliminary result)   Collection Time:  12:36 PM  Result Value Ref Range Status   Specimen Description BLOOD RIGHT HAND  Final   Special Requests IN PEDIATRIC BOTTLE Blood Culture adequate volume  Final   Culture NO GROWTH 2 DAYS  Final   Report Status PENDING  Incomplete    Radiology Studies: No results found. Scheduled Meds: . aspirin  325 mg Per Tube Daily  . atorvastatin  20 mg Per Tube QPM  . chlorhexidine  15 mL Mouth Rinse BID  . collagenase   Topical Daily  . darbepoetin (ARANESP) injection - DIALYSIS  200 mcg Intravenous Q Wed-HD  . feeding supplement (PRO-STAT SUGAR FREE 64)  30 mL Per Tube QID  . FLUoxetine  20 mg Per Tube Daily  . heparin subcutaneous  5,000 Units Subcutaneous Q8H  . insulin aspart  0-20 Units Subcutaneous Q4H  . insulin glargine  35 Units Subcutaneous BID  . metoprolol tartrate  25 mg Per Tube BID  . modafinil  200 mg Oral Daily  . Zinc Oxide   Topical BID   Continuous Infusions: . feeding supplement (OSMOLITE 1.5 CAL) 1,000 mL (07/05/16 1911)  . piperacillin-tazobactam (ZOSYN)  IV Stopped (07/06/16 0921)  . sodium chloride      LOS: 28 days  Irwin Brakeman, MD [facility] 07/06/2016, 11:10 AM Pager: [phone]  If 7PM-7AM, please contact night-coverage [email or web address] Password [email or web address] 07/06/2016, 11:10 AM

## 2016-07-06 NOTE — Progress Notes (Signed)
Pharmacy Antibiotic Note  [name] is a 41 y.o. female with fever and thigh would and recently on antibiotics.  Pharmacy has been consulted for Zosyn dosing. Patient is noted with ESRD (MWF). Tmax is 100.1 and WBC remains elevated at 18.9.   Plan: Continue zosyn 3.375gm IV Q12H (4 hr inf) F/u care plans, C&S, clinical status and LOT *Pharmacy will sign off as no dose adjustments anticipated. Thank you for the consult!   Height: 5\' 2"  (157.5 cm) Weight:  (unable to weigh due to scale broken ) IBW/kg (Calculated) : 50.1  Temp (24hrs), Avg:99.3 F (37.4 C), Min:98.2 F (36.8 C), Max:100.1 F (37.8 C)   Recent Labs Lab 07/02/16 0242 07/03/16 1327  0358 07/05/16 0845 07/05/16 0900 07/06/16 0234  WBC 18.8* 19.8* 18.9* 20.1* 17.9* 18.9*  CREATININE 1.83* 2.84* 1.59* 2.44*  --  1.58*    Estimated Creatinine Clearance: 56.8 mL/min (A) (by C-G formula based on SCr of 1.58 mg/dL (H)).    Allergies  Allergen Reactions  . Azithromycin Other (See Comments)    Nose bleeding event    Zosyn 5/25>> Vanc 4/30 >> 5/19 Zosyn 4/30>> 5/2; 5/7 >> 5/19 Cefepime 5/2 >> 5/7  5/9 preHD VR 29 5/14 preHD level 23 5/17 "preHD" VR 23  4/10 wound culture: mult orgs present, abundant bacteroides ovatus beta lactamase + RPR Reactive: T Pallidum Abx neg, likely false positive RPR 4/30 BCx: ngtd 5/1 LLE abscess: mult spec present, final 5/15 BCx: ngtd 5/15 MRSA PCR: ngtd 5/17 blood x2- ngtd 5/24 blood x2- ngtd   Lysle Pearlachel Mairin Lindsley, PharmD, BCPS Pager # [phone] 07/06/2016 12:52 PM

## 2016-07-07 LAB — GLUCOSE, CAPILLARY
GLUCOSE-CAPILLARY: 115 mg/dL — AB (ref 65–99)
Glucose-Capillary: 150 mg/dL — ABNORMAL HIGH (ref 65–99)
Glucose-Capillary: 167 mg/dL — ABNORMAL HIGH (ref 65–99)
Glucose-Capillary: 144 mg/dL — ABNORMAL HIGH (ref 65–99)
Glucose-Capillary: 116 mg/dL — ABNORMAL HIGH (ref 65–99)
Glucose-Capillary: 138 mg/dL — ABNORMAL HIGH (ref 65–99)

## 2016-07-07 MED ORDER — FLUOXETINE HCL 20 MG/5ML PO SOLN
40.0000 mg | Freq: Every day | ORAL | Status: DC
  Administered 2016-07-09 – 2016-07-15 (×6): 40 mg
  Filled 2016-07-07 (×11): qty 10

## 2016-07-07 MED ORDER — BUPROPION HCL 100 MG PO TABS
100.0000 mg | ORAL_TABLET | Freq: Two times a day (BID) | ORAL | Status: DC
  Administered 2016-07-07 – 2016-07-15 (×14): 100 mg
  Filled 2016-07-07 (×18): qty 1

## 2016-07-07 MED ORDER — ACETAMINOPHEN 325 MG PO TABS
650.0000 mg | ORAL_TABLET | Freq: Three times a day (TID) | ORAL | Status: DC
  Administered 2016-07-07 – 2016-07-15 (×25): 650 mg via ORAL
  Filled 2016-07-07 (×25): qty 2

## 2016-07-07 MED ORDER — HYDROMORPHONE HCL 1 MG/ML IJ SOLN
1.0000 mg | INTRAMUSCULAR | Status: DC | PRN
  Administered 2016-07-08 – 2016-07-15 (×6): 1 mg via INTRAVENOUS
  Filled 2016-07-07 (×4): qty 1

## 2016-07-07 MED ORDER — HYDROMORPHONE HCL 1 MG/ML PO LIQD
2.0000 mg | Freq: Once | ORAL | Status: DC
  Filled 2016-07-07: qty 2

## 2016-07-07 MED ORDER — MAGIC MOUTHWASH
10.0000 mL | Freq: Three times a day (TID) | ORAL | Status: DC
  Administered 2016-07-07 – 2016-07-15 (×24): 10 mL via ORAL
  Filled 2016-07-07 (×22): qty 10

## 2016-07-07 MED ORDER — HYDROMORPHONE HCL 1 MG/ML PO LIQD: 2.0000 mg | Freq: Three times a day (TID) | ORAL | Status: DC

## 2016-07-07 MED ORDER — HYDROMORPHONE HCL 2 MG PO TABS
2.0000 mg | ORAL_TABLET | Freq: Three times a day (TID) | ORAL | Status: DC
  Administered 2016-07-07 – 2016-07-15 (×22): 2 mg
  Filled 2016-07-07 (×22): qty 1

## 2016-07-07 NOTE — Progress Notes (Signed)
[facility] Progress Note   Subjective:  Nonverbal.  Eyes open but not tracking.  No change there Temps more low grade - back on ATB's   Vitals:   07/06/16 2201 07/07/16 0143 07/07/16 0525 07/07/16 0930  BP: (!) 106/58 127/68 110/83 (!) 113/58  Pulse: 89 70 (!) 111 (!) 109  Resp: 17 (!) 22 20 20   Temp: 97.3 F (36.3 C) 99.7 F (37.6 C) 98.8 F (37.1 C) 98.1 F (36.7 C)  TempSrc: Axillary Axillary Axillary Oral  SpO2: 97% 96% 100% 100%  Weight:      Height:       Exam: VS as above Does not follow commands  Moans/appears uncomfortable when repositioned or examined Anteriorly lungs clear Abd with G-tube in place w/drainage around the exit Largest left leg wound malodorous/necrotic Other superficial skin discoloration R medial thigh, with blackish ulcer forming  Eyes open - will not open/shut eyes to command  Dialysis Access: LUE AVG+ bruit   Inpatient medications: . aspirin  325 mg Per Tube Daily  . atorvastatin  20 mg Per Tube QPM  . chlorhexidine  15 mL Mouth Rinse BID  . collagenase   Topical Daily  . darbepoetin (ARANESP) injection - DIALYSIS  200 mcg Intravenous Q Wed-HD  . feeding supplement (PRO-STAT SUGAR FREE 64)  30 mL Per Tube QID  . FLUoxetine  20 mg Per Tube Daily  . heparin subcutaneous  5,000 Units Subcutaneous Q8H  . insulin aspart  0-20 Units Subcutaneous Q4H  . insulin glargine  35 Units Subcutaneous BID  . metoprolol tartrate  25 mg Per Tube BID  . modafinil  200 mg Oral Daily  . Zinc Oxide   Topical BID   . feeding supplement (OSMOLITE 1.5 CAL) 1,000 mL (07/05/16 1911)  . piperacillin-tazobactam (ZOSYN)  IV Stopped (07/07/16 0850)  . sodium chloride     [DISCONTINUED] acetaminophen **OR** [DISCONTINUED] acetaminophen (TYLENOL) oral liquid 160 mg/5 mL **OR** acetaminophen, acetaminophen, Gerhardt's butt cream, glucagon (human recombinant), hydrALAZINE, HYDROcodone-acetaminophen, HYDROmorphone (DILAUDID) injection, metoprolol  tartrate, ondansetron (ZOFRAN) IV   Recent Labs Lab 07/03/16 1327  0358 07/05/16 0845 07/06/16 0234  NA 132* 135 133* 134*  K 4.0 4.0 4.2 4.2  CL 95* 97* 96* 97*  CO2 27 28 28 27   GLUCOSE 195* 134* 193* 206*  BUN 78* 36* 60* 37*  CREATININE 2.84* 1.59* 2.44* 1.58*  CALCIUM 8.8* 8.1* 8.7* 8.5*  PHOS 2.7 2.1* 2.7  --     Recent Labs Lab  0358 07/05/16 0845 07/06/16 0234  AST  --   --  25  ALT  --   --  21  ALKPHOS  --   --  243*  BILITOT  --   --  0.5  PROT  --   --  6.7  ALBUMIN 1.2* <1.0* 1.2*    Recent Labs Lab 06/30/16 2322  07/05/16 0845 07/05/16 0900 07/06/16 0234  WBC 12.4*  < > 20.1* 17.9* 18.9*  NEUTROABS 8.7*  --   --   --  14.2*  HGB 8.8*  < > 6.6* 6.7* 9.3*  HCT 28.6*  < > 22.0* 22.5* 29.4*  MCV 92.6  < > 94.8 94.9 94.2  PLT 457*  < > 548* 528* 491*  < > = values in this interval not displayed.   Blood cultures 5/24 neg to date  Dialysis Orders: MWF [facility]  4.5h    400/800  105.5kg  3K/2Ca  LUA AVG  Hep 4400 - Mircera IV  q 2 weeks (last given 4/25) - Calcitriol 0.2525mcg PO q HD  Assessment/Recommendations:  1. 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 Nonverbal. No participation in PT at all.  Repeat MRI 5/22 with acute and subacute infarcts in the right thalamus, posterior limb internal capsule, and periatrial white matter, progressed. EEG non-specific. Neuro feels this is likely her new baseline. 2. ESRD- Continuing MWF schedule. Outpt HD no longer an option. Per notes currently no local LTAC options. Given very poor prognosis for meaningful recovery I would favor comfort care but family not in agreement with that approach at this time.   3. L post medial thigh soft-tissue wound, sp I&D x 2. ATBs restarted 5/25  d/t spiking fevers, increasing WBC. Surgeons have stated they feel leg wounds  "will never heal" Blood cultures 5/24 neg. Zosyn. 4. HTN  cont MTP bid BP goal 130-160 per neuro  5. Anemia of CKD - max darbe 200 / wk, no IV Fe given infection.  6. MBD w/ hyperCa++ >> no vit D/ vdra. Corrected Ca nl, phos low   7. HLD 8. DM - per primary care team 9. Malnutrition - very low albumin . Getting TF's. Phos stable low 2's 10. Disposition - Palliative Care (Dr. Phillips OdorGolding) is involved and continues to meet with family members who are unable to make decisions about de-escalation of care and sounds like entire family in upheaval as husband has left town, left the children, custody is being pursued. I did speak with her mother for some time on 5/25 and I phrased situation to her that "she cannot get well from all of this, her brain is badly injured from all the strokes, and the leg wound is rotting" and I advised that I feel we are prolonging her suffering at this time. She seemed receptive of my opinion, but I'm not sure given time to think on it how she will feel .   Linda Balynthia Charlye Spare, MD [facility] [phone] Pager 07/07/2016, 1:22 PM

## 2016-07-07 NOTE — Progress Notes (Signed)
Stopped by to visit w/ pt, but she was asleep. Chaplain available for f/u.   07/07/16 0900  Clinical Encounter Type  Visited With Patient not available  Visit Type Initial;Psychological support;Spiritual support;Social support  Referral From Nurse   Ephraim Hamburgerynthia A Gentri Guardado, Chaplain

## 2016-07-07 NOTE — Progress Notes (Signed)
PROGRESS NOTE    [name]  MRN:[id number] DOB: 03/22/1975 DOA: 05/24/2016 PCP: Barr, Julie, NP   Brief Narrative: [name] is a 41 y.o. female with a history of CVA, DM type 2, chronic lymphedema, HTN, chronic diastolic heart failure, depression. She presented with acute altered mental status and found to have new CVAs.  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 Possibly secondary to strokes. No evidence of infection at this point. -neurology recommendations: repeated MRI appears to show worsening Acute and subacute infarcts in the right thalamus, posterior limb internal capsule, and periatrial white matter, progressed from 06/09/2016.  Pt continues to remain essentially nonverbal and not following any commands.   Acute multifocal CVAs History of CVAs Neurology evaluated and signed off initially. Re-consulted on 06/30/16 -continue aspirin for secondary prevention -neurology recommendations appreciated, I spoke with Dr. Xu and he feels that this is likely her new baseline due to the areas of infarction. He tried provigil but no significant improvement in mental status or alertness.  -re-consulted palliative care with results of MRI with progressive disease, poor prognosis, cannot do outpatient dialysis and unable to place in LTAC due  to insurance issues.  I discussed with Dr Golding about asking [name] to take another look possibly make an exception.  Palliative medicine team Dr. Golding met with family 5/24: see notes and plan to meet with family 5/27 for further discussions.   L posterior/medial thigh wound Fevers Afebrile. Blood cultures have been negative. Repeat CT without abscess. TEE without evidence of vegetations. Increased bleeding overnight. Wound care to assess. Wound vac reapplied -repeat blood cultures negative to date -Pt had fever overnight and I ordered another set of blood culture, suspect fever related to wound which has been deteriorating and I spoke with surgery team and they said that patient would not take patient back to OR for debridement as she is too medically unstable and fearful that wounds will never heal.  Surgery says that wound vac could be removed and wet to dry dressings to wound could be pursued at this point as wound vac does not seem to be helping.   Given recurrent fevers, obtained blood cultures 5/24 NGTD and restarted zosyn IV per pharmacy 5/25.  Fever has effervesced.   ESRD on HD Dialysis MWF  Reactive RPR t pallidum abs negative.  Morbid obesity Body mass index is 41.41 kg/m  Nutrition PEG tube -continue tube feeds -continue with nutrition recommendations  Essential hypertension -continue metoprolol  Diabetes mellitus Hyperglycemia. Better controlled today. -continue Lantus 35u BID -continue SSI CBG (last 3)   Recent Labs  07/07/16 0136 07/07/16 0413 07/07/16 1140  GLUCAP 150* 167* 144*   Chronic diastolic heart failure Last TTE on 4/28 with EF of 55-60% with grade 1 diastolic dysfunction. EDW of 105.5kg  Leukocytosis No evidence of abscess but likely related leg wound which   is not getting better and has foul smell.  Tachycardia  EKG significant for sinus tachycardia. -continue metoprolol   Anemia of chronic disease S/p 2 units of PRBC in dialysis on  06/28/2016. Received 1 unit of PRBC in dialysis (5/21) repeat 5/25.   Multiple pressure ulcers -continue local wound care  DVT prophylaxis: heparin subq Code Status: Full code Family Communication: family meeting 5/24 Disposition Plan:  TBD  Consultants:   Palliative care medicine  Neurology  Nephrology  Procedures:   Dialysis MWF  Antimicrobials:  Vancomycin  Cefepime  Zosyn   Subjective: Pt nonverbal but seemed to respond to voice, moaning at times  Objective: Vitals:   07/06/16 2201 07/07/16 0143 07/07/16 0525 07/07/16 0930  BP: (!) 106/58 127/68 110/83 (!) 113/58  Pulse: 89 70 (!) 111 (!) 109  Resp: 17 (!) 22 20 20  Temp: 97.3 F (36.3 C) 99.7 F (37.6 C) 98.8 F (37.1 C) 98.1 F (36.7 C)  TempSrc: Axillary Axillary Axillary Oral  SpO2: 97% 96% 100% 100%  Weight:      Height:        Intake/Output Summary (Last 24 hours) at 07/07/16 1607 Last data filed at 07/07/16 0600  Gross per 24 hour  Intake              650 ml  Output              200 ml  Net              450 ml   Filed Weights   07/02/16 0407  Weight: 113 kg (249 lb 1.9 oz)   Review of systems - UTO due to mental status  Examination:  General exam: Appears calm. NAD. Nonverbal but blinks with questions. Respiratory system: Clear to auscultation. Respiratory effort normal. Cardiovascular system: S1 & S2 heard, elevated rate, normal rhythm. No murmurs. Gastrointestinal system: Abdomen is nondistended, soft and nontender. Normal bowel sounds heard. Central nervous system: nonresponsive, persistent rightward gaze.   Extremities:  No calf tenderness. Induration of left lateral thigh Skin: No cyanosis.  Left thigh necrotic wound and multiple other areas of skin breakdown noted.   Psychiatry: Not able to assess.  Data Reviewed: I have personally reviewed following labs and imaging studies  CBC:  Recent Labs Lab 06/30/16 2322  07/03/16 1327 07/04/16 0358 07/05/16 0845 07/05/16 0900  07/06/16 0234  WBC 12.4*  < > 19.8* 18.9* 20.1* 17.9* 18.9*  NEUTROABS 8.7*  --   --   --   --   --  14.2*  HGB 8.8*  < > 7.0* 7.5* 6.6* 6.7* 9.3*  HCT 28.6*  < > 23.1* 25.1* 22.0* 22.5* 29.4*  MCV 92.6  < > 93.1 94.4 94.8 94.9 94.2  PLT 457*  < > 517* 482* 548* 528* 491*  < > = values in this interval not displayed. Basic Metabolic Panel:  Recent Labs Lab 07/01/16 0729 07/02/16 0242 07/03/16 1327 07/04/16 0358 07/05/16 0845 07/06/16 0234  NA 130* 132* 132* 135 133* 134*  K 4.0 4.0 4.0 4.0 4.2 4.2  CL 94* 94* 95* 97* 96* 97*  CO2 25 27 27 28 28 27  GLUCOSE 307* 221* 195* 134* 193* 206*  BUN 88* 44* 78* 36* 60* 37*  CREATININE 3.00* 1.83* 2.84* 1.59* 2.44* 1.58*  CALCIUM 8.7* 8.4* 8.8* 8.1* 8.7* 8.5*  PHOS 2.8 1.7* 2.7 2.1* 2.7  --    GFR: Estimated Creatinine Clearance: 56.8 mL/min (A) (by C-G formula based on SCr of   1.58 mg/dL (H)). Liver Function Tests:  Recent Labs Lab 07/02/16 0242 07/03/16 1327 07/04/16 0358 07/05/16 0845 07/06/16 0234  AST  --   --   --   --  25  ALT  --   --   --   --  21  ALKPHOS  --   --   --   --  243*  BILITOT  --   --   --   --  0.5  PROT  --   --   --   --  6.7  ALBUMIN 1.2* 1.0* 1.2* <1.0* 1.2*   No results for input(s): LIPASE, AMYLASE in the last 168 hours. No results for input(s): AMMONIA in the last 168 hours. Coagulation Profile: No results for input(s): INR, PROTIME in the last 168 hours. Cardiac Enzymes: No results for input(s): CKTOTAL, CKMB, CKMBINDEX, TROPONINI in the last 168 hours. BNP (last 3 results) No results for input(s): PROBNP in the last 8760 hours. HbA1C: No results for input(s): HGBA1C in the last 72 hours. CBG:  Recent Labs Lab 07/06/16 1737 07/06/16 2157 07/07/16 0136 07/07/16 0413 07/07/16 1140  GLUCAP 186* 159* 150* 167* 144*   Lipid Profile: No results for input(s): CHOL, HDL, LDLCALC, TRIG, CHOLHDL, LDLDIRECT in the last 72 hours. Thyroid Function Tests: No results for input(s): TSH,  T4TOTAL, FREET4, T3FREE, THYROIDAB in the last 72 hours. Anemia Panel: No results for input(s): VITAMINB12, FOLATE, FERRITIN, TIBC, IRON, RETICCTPCT in the last 72 hours. Sepsis Labs: No results for input(s): PROCALCITON, LATICACIDVEN in the last 168 hours.  Recent Results (from the past 240 hour(s))  Culture, blood (Routine X 2) w Reflex to ID Panel     Status: None (Preliminary result)   Collection Time: 07/04/16 12:36 PM  Result Value Ref Range Status   Specimen Description BLOOD RIGHT HAND  Final   Special Requests IN PEDIATRIC BOTTLE Blood Culture adequate volume  Final   Culture NO GROWTH 2 DAYS  Final   Report Status PENDING  Incomplete  Culture, blood (Routine X 2) w Reflex to ID Panel     Status: None (Preliminary result)   Collection Time: 07/04/16 12:36 PM  Result Value Ref Range Status   Specimen Description BLOOD RIGHT HAND  Final   Special Requests IN PEDIATRIC BOTTLE Blood Culture adequate volume  Final   Culture NO GROWTH 3 DAYS  Final   Report Status PENDING  Incomplete    Radiology Studies: No results found. Scheduled Meds: . aspirin  325 mg Per Tube Daily  . atorvastatin  20 mg Per Tube QPM  . buPROPion  100 mg Per Tube BID  . chlorhexidine  15 mL Mouth Rinse BID  . collagenase   Topical Daily  . darbepoetin (ARANESP) injection - DIALYSIS  200 mcg Intravenous Q Wed-HD  . feeding supplement (PRO-STAT SUGAR FREE 64)  30 mL Per Tube QID  . [START ON 07/08/2016] FLUoxetine  40 mg Per Tube Daily  . heparin subcutaneous  5,000 Units Subcutaneous Q8H  . insulin aspart  0-20 Units Subcutaneous Q4H  . insulin glargine  35 Units Subcutaneous BID  . metoprolol tartrate  25 mg Per Tube BID  . modafinil  200 mg Oral Daily  . Zinc Oxide   Topical BID   Continuous Infusions: . feeding supplement (OSMOLITE 1.5 CAL) 1,000 mL (07/05/16 1911)  . piperacillin-tazobactam (ZOSYN)  IV 3.375 g (07/07/16 1532)  . sodium chloride      LOS: 29 days  Clanford Johnson, MD [facility] 07/07/2016, 4:07 PM Pager: [phone]  If 7PM-7AM, please contact night-coverage [email or web address] Password [email or web address] 07/07/2016, 4:07 PM  

## 2016-07-07 NOTE — Progress Notes (Signed)
                                                                                                                                                                                                         Daily Progress Note   Patient Name: Naziya Goldbach       Date: 07/07/2016 DOB: 12/22/1975  Age: 41 y.o. MRN#: [id number] Attending Physician: Johnson, Clanford L, MD Primary Care Physician: Barr, Julie, NP Admit Date: 05/28/2016  Reason for Consultation/Follow-up: Establishing goals of care  Length of Stay: 29   Family Meeting:  I met at bedside with [place], her sister Christine, her mother, her brother, and a cousin. I spoke with them at length and also spent a significant amount of the bedside caring for her including doing extensive mouthcare-she had large amounts of moisturizing gel in her mouth and evidence of thrush, I provided a neck support pillow, positioned her upright in the bed, I showed the family her MRI images with explanation, I showed them her wounds, I showed them her inability to move or follow commands consistently.  Tera was extremely alert, she was tracking in the room, they brought her three children into the room at which point Tovah became extremely emotional, she was crying and also moaning and grunting trying to communicate. She was able to squeeze her daughters hand with her right hand, and was trying to move her right arm but seemed to be limited by pain. She had difficulty staying awake but with continued stimulation seemed to be able to sustain her attention and wakefulness longer than I have seen her do recently.  I was honest with them about this new baseline, I explained how she was being fed through a tube, they asked me if she was getting HD, I explained it was very hard for her to get HD, that it makes her even more weak. I also introduced an exit strategy for HD, they really struggle with understanding the details of these types of issues.  They  were unable to consider a QOL question- they cannot discuss dying, but I was honest in saying that her condition was fragile and another stroke, worsening infection or other event could take her life regardless of the decisions we make.   They need reassurance that we WILL do everything medically reasonable- and that may actually be comfort, antibiotics and even HD if she is hemodynamically tolerating this. We discuss the wounds-they will likely never heal and are painful getting changed.  They made multiple phone calls to family while I was   in the room- many are on their way into town- I hope this is a support, but may also complicate things- they are trying to get financial POA, since her husband is getting her disability money, and not helping with her their kids. Its extremely complicated socially and psychologically.  Again, I am still not in favor of unilateral decisions. We are making progress today- she is now a "meds only" code. I explained CPR and intubation would be medically inappropriate and that multiple other providers have agreed on this.   She needs an LTACH, not a hospice house based on her self reported values of QOL "keep her alive on a machine in the basement if that's what it takes"- and the goals of care established both by [facility] when she was verbal and by the family.  I will continue to follow- I think I will be able to get them to a place of resolution with time. Her children really need help processing this-her 40 yo asked me when her mommy was coming home. They have transportation issues -I doubt the kids would be able to get help from [place] I offered and provided information.  Chaise's neurological condition isn't locked in and she is purposefully processing everything happening in the room.   I increased her Provigil, added Wellbutrin and added on scheduled and IV prn hydromorphone for pain.  Physical Exam  Nursing note and vitals reviewed.           Time:  120 minutes Level 3: 2-235 Prolonged 2:35-4:05 Greater than 50%  of this time was spent counseling and coordinating care related to the above assessment and plan.  GOLDING,ELIZABETH, DO  Please contact Palliative Medicine Team phone at [phone] for questions and concerns.

## 2016-07-08 LAB — RENAL FUNCTION PANEL
Anion gap: 13 (ref 5–15)
BUN: 92 mg/dL — ABNORMAL HIGH (ref 6–20)
CALCIUM: 8.7 mg/dL — AB (ref 8.9–10.3)
CO2: 26 mmol/L (ref 22–32)
CREATININE: 3.11 mg/dL — AB (ref 0.44–1.00)
Chloride: 96 mmol/L — ABNORMAL LOW (ref 101–111)
GFR calc Af Amer: 21 mL/min — ABNORMAL LOW (ref >60)
GFR calc non Af Amer: 18 mL/min — ABNORMAL LOW (ref >60)
GLUCOSE: 149 mg/dL — AB (ref 65–99)
PHOSPHORUS: 2.6 mg/dL (ref 2.5–4.6)
Potassium: 4.7 mmol/L (ref 3.5–5.1)
SODIUM: 135 mmol/L (ref 135–145)

## 2016-07-08 LAB — CBC
HCT: 26.5 % — ABNORMAL LOW (ref 36.0–46.0)
Hemoglobin: 8 g/dL — ABNORMAL LOW (ref 12.0–15.0)
MCH: 28.8 pg (ref 26.0–34.0)
MCHC: 30.2 g/dL (ref 30.0–36.0)
MCV: 95.3 fL (ref 78.0–100.0)
PLATELETS: 505 10*3/uL — AB (ref 150–400)
RBC: 2.78 MIL/uL — ABNORMAL LOW (ref 3.87–5.11)
RDW: 17.5 % — AB (ref 11.5–15.5)
WBC: 17.6 10*3/uL — ABNORMAL HIGH (ref 4.0–10.5)

## 2016-07-08 LAB — GLUCOSE, CAPILLARY
GLUCOSE-CAPILLARY: 148 mg/dL — AB (ref 65–99)
Glucose-Capillary: 202 mg/dL — ABNORMAL HIGH (ref 65–99)
Glucose-Capillary: 263 mg/dL — ABNORMAL HIGH (ref 65–99)

## 2016-07-08 NOTE — Progress Notes (Signed)
Wound dressings changed to L thigh and L posterior ankle using santyl to slough in wound bed, covered with wet saline gauze, dry gauze pads, ABD pad and secured with stretch gauze roll. Tape placed to gauze roll to decrease amount of adhesive on patient's skin.  Replaced foam dressings to inner right thigh, right knee and outer L hip. Lawson RadarHeather M Garlene Apperson

## 2016-07-08 NOTE — Progress Notes (Signed)
[facility] Brief Note Appreciate Dr. Lamar BlinksGolding's most incredibly kind, empathetic and straightforward approach to this situation and care of this family. As per her note, continuing dialysis as [name] tolerates tolerates it as ongoing long term care plans proceed. Dr. Lowell GuitarPowell will be following patient this week.  Linda Balynthia Marlaine Arey, MD [facility] [phone] Pager 07/08/2016, 6:58 AM

## 2016-07-08 NOTE — Procedures (Signed)
Tol HD.  BFR 400.  HR 122.  Temp 101. Opens eyes to verbal stimulation.  No voluntary response to commands. [name] C

## 2016-07-09 DIAGNOSIS — I63019 Cerebral infarction due to thrombosis of unspecified vertebral artery: Secondary | ICD-10-CM

## 2016-07-09 LAB — GLUCOSE, CAPILLARY
GLUCOSE-CAPILLARY: 189 mg/dL — AB (ref 65–99)
Glucose-Capillary: 184 mg/dL — ABNORMAL HIGH (ref 65–99)
Glucose-Capillary: 198 mg/dL — ABNORMAL HIGH (ref 65–99)
Glucose-Capillary: 217 mg/dL — ABNORMAL HIGH (ref 65–99)
Glucose-Capillary: 217 mg/dL — ABNORMAL HIGH (ref 65–99)
Glucose-Capillary: 220 mg/dL — ABNORMAL HIGH (ref 65–99)

## 2016-07-09 LAB — CULTURE, BLOOD (ROUTINE X 2)
Culture: NO GROWTH
SPECIAL REQUESTS: ADEQUATE

## 2016-07-09 NOTE — Progress Notes (Addendum)
PROGRESS NOTE    Francess Mullen  KON:[id number] DOB: September 11, 1975 DOA: 05/14/2016 PCP: Marletta Lor, NP  Brief Narrative: [name] is a 41 y.o. female with a history of CVA, DM type 2, chronic lymphedema, HTN, chronic diastolic heart failure, depression. She presented with acute altered mental status and found to have new CVAs.  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 Possibly secondary to strokes. No evidence of infection at this point. -neurology recommendations: repeated MRI appears to show worsening Acute and subacute infarcts in the right thalamus, posterior limb internal capsule, and periatrial white matter, progressed from 05/25/2016.  Pt continues to remain essentially nonverbal and not following any commands.   Acute multifocal CVAs History of CVAs Neurology evaluated and signed off initially. Re-consulted on 06/30/16 -continue aspirin for secondary prevention -neurology recommendations appreciated, I spoke with Dr. Roda Shutters and he feels that this is likely her new baseline due to the areas of infarction. He tried provigil but no significant improvement in mental status or alertness.  -re-consulted palliative care with results of MRI with progressive disease, poor prognosis, cannot do outpatient dialysis and unable to place in LTAC due  to insurance issues.  I discussed with Dr Phillips Odor about asking [name] to take another look possibly make an exception.  Palliative medicine team Dr. Phillips Odor met with family 5/24: see notes and plan to meet with family 5/27 for further discussions.   L posterior/medial thigh wound Fevers Blood cultures have been negative. Repeat CT without abscess. TEE without evidence of vegetations. Increased bleeding overnight. Wound care to assess. Wound vac reapplied -repeat blood cultures negative to date -Pt had fever overnight and I ordered another set of blood culture, suspect fever related to wound which has been deteriorating and I spoke with surgery team and they said that patient would not take patient back to OR for debridement as she is too medically unstable and fearful that wounds will never heal.  Surgery says that wound vac could be removed and wet to dry dressings to wound could be pursued at this point as wound vac does not seem to be helping.   Given recurrent fevers, obtained blood cultures 5/24 NGTD and restarted zosyn IV per pharmacy 5/25.  No growth to date and continues to spike fevers, likely due to chronic nonhealing wounds.   ESRD on HD Dialysis MWF, appreciate excellent care from nephrologists  Reactive RPR t pallidum abs negative.  Morbid obesity Body mass index is 41.41 kg/m  Nutrition PEG tube -continue tube feeds -continue with nutrition recommendations  Essential hypertension -continue metoprolol  Diabetes mellitus Hyperglycemia. Better controlled today. -continue Lantus 35u BID -continue SSI CBG (last 3)   Recent Labs  07/09/16 0007 07/09/16 0317 07/09/16 0818  GLUCAP 184* 217* 198*   Chronic diastolic heart failure Last TTE on 4/28 with EF of 55-60% with grade 1 diastolic dysfunction.  EDW of 105.5kg  Leukocytosis No evidence of abscess but likely related leg wound which is not getting better and has foul smell and likely source of  fevers.  Tachycardia  EKG significant for sinus tachycardia. -continue metoprolol   Anemia of chronic disease S/p 2 units of PRBC in dialysis on 06/28/2016. Received 1 unit of PRBC in dialysis (5/21) repeat 5/25.   Multiple pressure ulcers -continue local wound care  DVT prophylaxis: heparin subq Code Status: Full code Family Communication: family meeting 5/24, 5/28 family asked for letter to help them with social services and getting guardianship of her children, I will work on it today and leave in the [facility] for family to pick up.  Disposition Plan:  TBD  Consultants:   Palliative care medicine  Neurology  Nephrology  Procedures:   Dialysis MWF  Antimicrobials:  Vancomycin  Cefepime  Zosyn   Subjective: Pt nonverbal but seemed to respond to voice, appears more comfortable today  Objective: Vitals:   07/08/16 2014 07/09/16 0055 07/09/16 0117 07/09/16 0645  BP: (!) 105/58 (!) 102/31 128/69 (!) 151/83  Pulse: (!) 120 (!) 109 (!) 111 (!) 114  Resp: 18 (!) '22 20 20  '$ Temp: 99.1 F (37.3 C) 100.2 F (37.9 C) 100.1 F (37.8 C) (!) 101.5 F (38.6 C)  TempSrc: Oral Axillary Axillary Axillary  SpO2: 99% 100% 100% 100%  Height:        Intake/Output Summary (Last 24 hours) at 07/09/16 0954 Last data filed at 07/08/16 1242  Gross per 24 hour  Intake                0 ml  Output             2000 ml  Net            -2000 ml   Filed Weights   Review of systems - UTO due to mental status  Examination:  General exam: Appears comfortable. NAD. Nonverbal but blinks with questions. Respiratory system: Clear to auscultation. Respiratory effort normal. Cardiovascular system: S1 & S2 heard, tachycardic, normal rhythm. No murmurs. Gastrointestinal system: Abdomen is nondistended, soft and nontender. Normal bowel sounds heard. Central nervous system: nonresponsive, persistent rightward gaze.   Extremities:  No calf tenderness. Induration of  left lateral thigh Skin: No cyanosis.  Left thigh necrotic wound and multiple other areas of skin breakdown noted.   Psychiatry: Not able to assess.  Data Reviewed: I have personally reviewed following labs and imaging studies  CBC:  Recent Labs Lab  0358 07/05/16 0845 07/05/16 0900 07/06/16 0234 07/08/16 0811  WBC 18.9* 20.1* 17.9* 18.9* 17.6*  NEUTROABS  --   --   --  14.2*  --   HGB 7.5* 6.6* 6.7* 9.3* 8.0*  HCT 25.1* 22.0* 22.5* 29.4* 26.5*  MCV 94.4 94.8 94.9 94.2 95.3  PLT 482* 548* 528* 491* 841*   Basic Metabolic Panel:  Recent Labs Lab 07/03/16 1327  0358 07/05/16 0845 07/06/16 0234 07/08/16 0620  NA 132* 135 133* 134* 135  K 4.0 4.0 4.2 4.2 4.7  CL 95* 97* 96* 97* 96*  CO2 '27 28 28 27 26  '$ GLUCOSE 195* 134* 193* 206* 149*  BUN 78* 36* 60* 37* 92*  CREATININE 2.84* 1.59* 2.44* 1.58* 3.11*  CALCIUM 8.8* 8.1* 8.7* 8.5* 8.7*  PHOS 2.7 2.1* 2.7  --  2.6   GFR: Estimated Creatinine Clearance: 28.9 mL/min (A) (by C-G formula based on SCr of 3.11 mg/dL (H)).  Liver Function Tests:  Recent Labs Lab 07/03/16 1327  0358 07/05/16 0845 07/06/16 0234 07/08/16 0620  AST  --   --   --  25  --   ALT  --   --   --  21  --   ALKPHOS  --   --   --  243*  --   BILITOT  --   --   --  0.5  --   PROT  --   --   --  6.7  --   ALBUMIN 1.0* 1.2* <1.0* 1.2* <1.0*   No results for input(s): LIPASE, AMYLASE in the last 168 hours. No results for input(s): AMMONIA in the last 168 hours. Coagulation Profile: No results for input(s): INR, PROTIME in the last 168 hours. Cardiac Enzymes: No results for input(s): CKTOTAL, CKMB, CKMBINDEX, TROPONINI in the last 168 hours. BNP (last 3 results) No results for input(s): PROBNP in the last 8760 hours. HbA1C: No results for input(s): HGBA1C in the last 72 hours. CBG:  Recent Labs Lab 07/08/16 1641 07/08/16 2012 07/09/16 0007 07/09/16 0317 07/09/16 0818  GLUCAP 202* 263* 184* 217* 198*   Lipid  Profile: No results for input(s): CHOL, HDL, LDLCALC, TRIG, CHOLHDL, LDLDIRECT in the last 72 hours. Thyroid Function Tests: No results for input(s): TSH, T4TOTAL, FREET4, T3FREE, THYROIDAB in the last 72 hours. Anemia Panel: No results for input(s): VITAMINB12, FOLATE, FERRITIN, TIBC, IRON, RETICCTPCT in the last 72 hours. Sepsis Labs: No results for input(s): PROCALCITON, LATICACIDVEN in the last 168 hours.  Recent Results (from the past 240 hour(s))  Culture, blood (Routine X 2) w Reflex to ID Panel     Status: None (Preliminary result)   Collection Time:  12:36 PM  Result Value Ref Range Status   Specimen Description BLOOD RIGHT HAND  Final   Special Requests IN PEDIATRIC BOTTLE Blood Culture adequate volume  Final   Culture NO GROWTH 3 DAYS  Final   Report Status PENDING  Incomplete  Culture, blood (Routine X 2) w Reflex to ID Panel     Status: None (Preliminary result)   Collection Time:  12:36 PM  Result Value Ref Range Status   Specimen Description BLOOD RIGHT HAND  Final   Special Requests IN PEDIATRIC BOTTLE Blood Culture adequate volume  Final   Culture NO GROWTH 4 DAYS  Final   Report Status PENDING  Incomplete    Radiology Studies: No results found. Scheduled Meds: . acetaminophen  650 mg Oral TID  . aspirin  325 mg Per Tube Daily  . atorvastatin  20 mg Per Tube QPM  . buPROPion  100 mg Per Tube BID  . chlorhexidine  15 mL Mouth Rinse BID  . collagenase   Topical Daily  . darbepoetin (ARANESP) injection - DIALYSIS  200 mcg Intravenous Q Wed-HD  . feeding supplement (PRO-STAT SUGAR FREE 64)  30 mL Per Tube QID  . FLUoxetine  40 mg Per Tube Daily  . heparin subcutaneous  5,000 Units Subcutaneous Q8H  . HYDROmorphone  2 mg Per Tube TID  . insulin aspart  0-20 Units Subcutaneous Q4H  . insulin glargine  35 Units Subcutaneous BID  . magic mouthwash  10 mL Oral TID  . metoprolol tartrate  25 mg Per Tube BID  . modafinil  200 mg Oral Daily  . Zinc  Oxide   Topical BID   Continuous Infusions: . feeding supplement (OSMOLITE 1.5 CAL) 1,000 mL (07/08/16 1448)  . piperacillin-tazobactam (ZOSYN)  IV  Stopped (07/09/16 0800)  . sodium chloride      LOS: 31 days  Irwin Brakeman, MD [facility] 07/09/2016, 9:54 AM Pager: [phone]  If 7PM-7AM, please contact night-coverage [email or web address] Password [facility] 07/09/2016, 9:54 AM

## 2016-07-09 NOTE — Progress Notes (Signed)
Nutrition Follow-up  DOCUMENTATION CODES:   Morbid obesity  INTERVENTION:   TF: continue current TF regimen at present. If stool continues, may consider switching TF formula again with removal of Pro-stat supplement  NUTRITION DIAGNOSIS:   Inadequate oral intake related to lethargy/confusion as evidenced by NPO status.  Being addressed via TF  GOAL:   Patient will meet greater than or equal to 90% of their needs  MONITOR:   Diet advancement, PO intake, Weight trends, I & O's, TF tolerance  REASON FOR ASSESSMENT:   Malnutrition Screening Tool    ASSESSMENT:   41 y.o.woman (SNF resident) with PMH of CVA, DM2, morbid obesity, chronic lymphedema, HTN, chronic diastolic heart failure, depression, ESRD on HD, and infected leg ulcer. Admitted on 4/27 after her HD session with mental status changes.   Nonverbal, no participation in PT, per MD notes this is likely new baseline.  Palliative care following  Tolerating Osmolite 1.5 at rate of 50 ml/hr with Prostat QID via G-tube  Continues with HD on MWF; per MD notes, outpatient HD no longer an option. Further poc pending  L post medial thigh soft-tissue wound, sp I&D x 2. Pt remains febrile, on antibiotics. Surgeons feel non operative candidate.  +thrush, magic mouthwash ordered  Labs: albumin <1.0, corrected calcium >11.2, potassium wdl, phosphorus wdl, CBGs [phone] Meds: ss novolog, lantus  Diet Order:  Diet NPO time specified  Skin:  Wound (see comment) (R leg, sacrum, stg 2 buttocks&L thigh;DTI R heel;unst L heel)  Last BM:  5/29   Height:   Ht Readings from Last 1 Encounters:  06/09/16 5\' 2"  (1.575 m)    Weight:   Wt Readings from Last 1 Encounters:  06/30/16 229 lb (103.9 kg)     Ideal Body Weight:  50 kg  BMI:  Body mass index is 45.56 kg/m.  Estimated Nutritional Needs:   Kcal:  2000-2200  Protein:  120-130 gm  Fluid:  per MD  EDUCATION NEEDS:   No education needs identified at this  time  Romelle StarcherCate Jamesina Gaugh MS, RD, LDN [phone] Pager  [phone] Weekend/On-Call Pager

## 2016-07-09 NOTE — Progress Notes (Signed)
Assessment/Recommendations:  1. 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 Nonverbal. No participation in PT at all.  Repeat MRI 5/22 with acute and subacute infarcts in the right thalamus, posterior limb internal capsule, and periatrial white matter, progressed. EEG non-specific. Neuro feels this is likely her new baseline. 2. ESRD- Continuing MWF schedule. Outpt HD no longer an option. Per notes currently no local LTAC options.    3. L post medial thigh soft-tissue wound, sp I&D x 2. ATBs restarted 5/25  d/t spiking fevers, increasing WBC. Surgeons feel non operative candidate. Blood cultures 5/24 neg. Zosyn  Subjective: Interval History: Febrile  Objective: Vital signs in last 24 hours: Temp:  [97.7 F (36.5 C)-101.5 F (38.6 C)] 101.2 F (38.4 C) (05/29 1030) Pulse Rate:  [107-120] 107 (05/29 1030) Resp:  [18-22] 18 (05/29 1030) BP: (102-151)/(31-83) 139/54 (05/29 1030) SpO2:  [97 %-100 %] 99 % (05/29 1030) Weight change:   Intake/Output from previous day: 05/28 0701 - 05/29 0700 In: -  Out: 2000  Intake/Output this shift: No intake/output data recorded.  General appearance: no purposeful movement Resp: clear to auscultation bilaterally Chest wall: no tenderness Cardio: regular rate and rhythm, S1, S2 normal, no murmur, click, rub or gallop GI: soft, non-tender; bowel sounds normal; no masses,  no organomegaly  Lab Results:  Recent Labs  07/08/16 0811  WBC 17.6*  HGB 8.0*  HCT 26.5*  PLT 505*   BMET:  Recent Labs  07/08/16 0620  NA 135  K 4.7  CL 96*  CO2 26  GLUCOSE 149*  BUN 92*  CREATININE 3.11*  CALCIUM 8.7*   No results for input(s): PTH in the last 72 hours. Iron Studies: No results for input(s): IRON, TIBC, TRANSFERRIN, FERRITIN in the last 72 hours. Studies/Results: No results found.  Scheduled: . acetaminophen  650 mg Oral TID  . aspirin  325 mg Per Tube Daily  .  atorvastatin  20 mg Per Tube QPM  . buPROPion  100 mg Per Tube BID  . chlorhexidine  15 mL Mouth Rinse BID  . collagenase   Topical Daily  . darbepoetin (ARANESP) injection - DIALYSIS  200 mcg Intravenous Q Wed-HD  . feeding supplement (PRO-STAT SUGAR FREE 64)  30 mL Per Tube QID  . FLUoxetine  40 mg Per Tube Daily  . heparin subcutaneous  5,000 Units Subcutaneous Q8H  . HYDROmorphone  2 mg Per Tube TID  . insulin aspart  0-20 Units Subcutaneous Q4H  . insulin glargine  35 Units Subcutaneous BID  . magic mouthwash  10 mL Oral TID  . metoprolol tartrate  25 mg Per Tube BID  . modafinil  200 mg Oral Daily  . Zinc Oxide   Topical BID     LOS: 31 days   Maeby Vankleeck C 07/09/2016,1:44 PM

## 2016-07-09 NOTE — Care Management Note (Signed)
Case Management Note  Patient Details  Name: Linda DykesDavenia McKinnon [name] MRN: [id number] Date of Birth: 06/17/1975  Subjective/Objective:                    Action/Plan: CM continuing to follow for d/c disposition.  Expected Discharge Date:                  Expected Discharge Plan:  Skilled Nursing Facility  In-House Referral:  Clinical Social Work  Discharge planning Services  CM Consult  Post Acute Care Choice:    Choice offered to:     DME Arranged:    DME Agency:     HH Arranged:    HH Agency:     Status of Service:  Completed, signed off  If discussed at [employer] Meetings, dates discussed:    Additional Comments:  Kermit BaloKelli F Sahana Boyland, RN 07/09/2016, 4:00 PM

## 2016-07-09 NOTE — Progress Notes (Signed)
Patient spouse, Linda CheeksRicardo [name], calling requesting information on patient asking if patient was "brain dead", and if staff were "planning on pulling the plug".  Patient spouse requested of phone number to be reached at, given [phone]. Text page of number sent to MD via Amion.

## 2016-07-10 LAB — CULTURE, BLOOD (ROUTINE X 2)
CULTURE: NO GROWTH
SPECIAL REQUESTS: ADEQUATE

## 2016-07-10 LAB — RENAL FUNCTION PANEL
ALBUMIN: 1 g/dL — AB (ref 3.5–5.0)
Anion gap: 12 (ref 5–15)
BUN: 67 mg/dL — AB (ref 6–20)
CO2: 26 mmol/L (ref 22–32)
Calcium: 8.8 mg/dL — ABNORMAL LOW (ref 8.9–10.3)
Chloride: 95 mmol/L — ABNORMAL LOW (ref 101–111)
Creatinine, Ser: 2.62 mg/dL — ABNORMAL HIGH (ref 0.44–1.00)
GFR calc Af Amer: 25 mL/min — ABNORMAL LOW (ref >60)
GFR calc non Af Amer: 22 mL/min — ABNORMAL LOW (ref >60)
GLUCOSE: 180 mg/dL — AB (ref 65–99)
PHOSPHORUS: 3 mg/dL (ref 2.5–4.6)
Potassium: 5.2 mmol/L — ABNORMAL HIGH (ref 3.5–5.1)
Sodium: 133 mmol/L — ABNORMAL LOW (ref 135–145)

## 2016-07-10 LAB — GLUCOSE, CAPILLARY
Glucose-Capillary: 161 mg/dL — ABNORMAL HIGH (ref 65–99)
Glucose-Capillary: 169 mg/dL — ABNORMAL HIGH (ref 65–99)
Glucose-Capillary: 242 mg/dL — ABNORMAL HIGH (ref 65–99)

## 2016-07-10 LAB — CBC WITH DIFFERENTIAL/PLATELET
Basophils Absolute: 0 10*3/uL (ref 0.0–0.1)
Basophils Relative: 0 %
EOS PCT: 2 %
Eosinophils Absolute: 0.4 10*3/uL (ref 0.0–0.7)
HEMATOCRIT: 27.8 % — AB (ref 36.0–46.0)
Hemoglobin: 8.3 g/dL — ABNORMAL LOW (ref 12.0–15.0)
LYMPHS ABS: 3.1 10*3/uL (ref 0.7–4.0)
LYMPHS PCT: 16 %
MCH: 28.5 pg (ref 26.0–34.0)
MCHC: 29.9 g/dL — AB (ref 30.0–36.0)
MCV: 95.5 fL (ref 78.0–100.0)
MONO ABS: 1.4 10*3/uL — AB (ref 0.1–1.0)
MONOS PCT: 7 %
NEUTROS ABS: 13.9 10*3/uL — AB (ref 1.7–7.7)
Neutrophils Relative %: 75 %
PLATELETS: 492 10*3/uL — AB (ref 150–400)
RBC: 2.91 MIL/uL — ABNORMAL LOW (ref 3.87–5.11)
RDW: 17.3 % — AB (ref 11.5–15.5)
WBC: 18.8 10*3/uL — ABNORMAL HIGH (ref 4.0–10.5)

## 2016-07-10 LAB — HEPATITIS B SURFACE ANTIGEN: HEP B S AG: NEGATIVE

## 2016-07-10 MED ORDER — SODIUM CHLORIDE 0.9 % IV SOLN: 100.0000 mL | INTRAVENOUS | Status: DC | PRN

## 2016-07-10 MED ORDER — ALTEPLASE 2 MG IJ SOLR: 2.0000 mg | Freq: Once | INTRAMUSCULAR | Status: DC | PRN

## 2016-07-10 MED ORDER — DARBEPOETIN ALFA 200 MCG/0.4ML IJ SOSY
PREFILLED_SYRINGE | INTRAMUSCULAR | Status: AC
  Filled 2016-07-10: qty 0.4

## 2016-07-10 MED ORDER — PENTAFLUOROPROP-TETRAFLUOROETH EX AERO: 1.0000 "application " | INHALATION_SPRAY | CUTANEOUS | Status: DC | PRN

## 2016-07-10 MED ORDER — LIDOCAINE-PRILOCAINE 2.5-2.5 % EX CREA: 1.0000 "application " | TOPICAL_CREAM | CUTANEOUS | Status: DC | PRN

## 2016-07-10 MED ORDER — HEPARIN SODIUM (PORCINE) 1000 UNIT/ML DIALYSIS: 1000.0000 [IU] | INTRAMUSCULAR | Status: DC | PRN

## 2016-07-10 MED ORDER — HEPARIN SODIUM (PORCINE) 1000 UNIT/ML DIALYSIS: 20.0000 [IU]/kg | INTRAMUSCULAR | Status: DC | PRN

## 2016-07-10 MED ORDER — LIDOCAINE HCL (PF) 1 % IJ SOLN: 5.0000 mL | INTRAMUSCULAR | Status: DC | PRN

## 2016-07-10 NOTE — Evaluation (Signed)
Physical Therapy Evaluation Patient Details Name: [name] MRN: [id number] DOB: [birth year] Today's Date: 07/10/2016   History of Present Illness  Patient is a 41 yo female admitted 05/28/2016 with [facility] from [facility].  Patient with Rt infarcts, occluded MCA, Rt ICA, and Lt vertebral artery. No verbalizations or voluntary movement.   PMH:  CVA with RLE weak, DM, obesity, chronic lymphedema, wound Rt calf, VAC on Lt thigh, HTN, CHF, depression, ESRD on HD, asthma  Clinical Impression  Patient seen for re-evaluation per request to increase mobility and determine if patient can tolerate being in a chair. At this time, patient only showing minimal signs of activity (non-purposeful) no ability to perform protective reactions or any active trunk control. Patient is a total assist of 2-3 persons. At this time, do not feel patient is appropriate for further acute interventions and patient is NOT SAFE for chair activity, nor does patient have any ability to protect skin integrity if OOB and given current wounds, feel this would be detrimental to patient. At this time, continue to recommend LTACH and will defer all PROM activity to nsg. No further acute PT at this time as patient is unable to participate in therapy sessions. Acute PT to sign off.    Follow Up Recommendations LTACH;Supervision/Assistance - 24 hour    Equipment Recommendations  Hospital bed    Recommendations for Other Services       Precautions / Restrictions Precautions Precautions: Fall Precaution Comments: flexiseal in place Restrictions Weight Bearing Restrictions: No      Mobility  Bed Mobility Overal bed mobility: Needs Assistance Bed Mobility: Supine to Sit;Sit to Supine     Supine to sit: Total assist;+2 for physical assistance Sit to supine: Total assist;+2 for physical assistance   General bed mobility comments: session focused on EOB attempt, patient with no signs of trunk control, weight shift, or  ability to perform protective reactions EOB (mechanical use of bed required)  Transfers                    Ambulation/Gait                Stairs            Wheelchair Mobility    Modified Rankin (Stroke Patients Only) Modified Rankin (Stroke Patients Only) Pre-Morbid Rankin Score: Moderate disability Modified Rankin: Severe disability     Balance Overall balance assessment: Needs assistance Sitting-balance support: Feet supported;Bilateral upper extremity supported Sitting balance-Leahy Scale: Zero Sitting balance - Comments: Total assist EOB ~5 mins                                     Pertinent Vitals/Pain Pain Assessment: Faces Faces Pain Scale: Hurts a little bit Pain Location: withdraws to pain Pain Descriptors / Indicators: Moaning Pain Intervention(s): Monitored during session    Home Living Family/patient expects to be discharged to:: Skilled nursing facility [facility])                      Prior Function                 Hand Dominance        Extremity/Trunk Assessment        Lower Extremity Assessment Lower Extremity Assessment: Difficult to assess due to impaired cognition (no purposeful movement noted, ) RLE Deficits / Details: withdrawl response to pain, otherwise no  purposeful movement noted LLE Deficits / Details: withdrawl response to pain, otherwise no purposeful movement noted       Communication   Communication: Expressive difficulties;Receptive difficulties  Cognition Arousal/Alertness: Lethargic Behavior During Therapy: Flat affect Overall Cognitive Status: Difficult to assess Area of Impairment: Following commands                       Following Commands:  (does not follow commands this session)   Awareness:  (pre-intellectual)   General Comments: Pt not able to verbalize or follow commands this session. Moaning throughout. No pain response in B UE but does demonstrate pain  response in B LE's. Pt potentially responding to loud auditory stimuli approximately 10% of the time by opening eyes but not localizing to source. No response noted to music, telephone ring, cold cloth, or flashlight near eyes. Approximately 2 seconds of eye contact at one point during session but unable to assess if this was purposeful.       General Comments General comments (skin integrity, edema, etc.): multiple wounds noted; pericare performed during session    Exercises     Assessment/Plan    PT Assessment All further PT needs can be met in the next venue of care  PT Problem List         PT Treatment Interventions      PT Goals (Current goals can be found in the Care Plan section)  Acute Rehab PT Goals Patient Stated Goal: none stated PT Goal Formulation: Patient unable to participate in goal setting Time For Goal Achievement: 07/09/16 Potential to Achieve Goals: Poor    Frequency Other (Comment) (d/c pt from PT)   Barriers to discharge        Co-evaluation PT/OT/SLP Co-Evaluation/Treatment: Yes Reason for Co-Treatment: Complexity of the patient's impairments (multi-system involvement) PT goals addressed during session: Mobility/safety with mobility         AM-PAC PT "6 Clicks" Daily Activity  Outcome Measure Difficulty turning over in bed (including adjusting bedclothes, sheets and blankets)?: Total Difficulty moving from lying on back to sitting on the side of the bed? : Total Difficulty sitting down on and standing up from a chair with arms (e.g., wheelchair, bedside commode, etc,.)?: Total Help needed moving to and from a bed to chair (including a wheelchair)?: Total Help needed walking in hospital room?: Total Help needed climbing 3-5 steps with a railing? : Total 6 Click Score: 6    End of Session Equipment Utilized During Treatment: Oxygen Activity Tolerance: Patient limited by lethargy Patient left: in bed;with call bell/phone within reach Nurse  Communication: Mobility status PT Visit Diagnosis: Other symptoms and signs involving the nervous system (R29.898);Muscle weakness (generalized) (M62.81)     Time: 5974-1638 PT Time Calculation (min) (ACUTE ONLY): 30 min   Charges:   PT Evaluation $PT Re-evaluation: 1 Procedure     PT G Codes:        Alben Deeds, PT DPT  [phone]   Duncan Dull 07/10/2016, 5:17 PM

## 2016-07-10 NOTE — Progress Notes (Addendum)
Occupational Therapy Treatment Patient Details Name: [name] MRN: [id number] DOB: [birth year] Today's Date: 07/10/2016    History of present illness Patient is a 41 yo female admitted 07-06-2016 with [facility].  Patient with Rt infarcts, occluded MCA, Rt ICA, and Lt vertebral artery. No verbalizations or voluntary movement.   PMH:  CVA with RLE weak, DM, obesity, chronic lymphedema, wound Rt calf, VAC on Lt thigh, HTN, CHF, depression, ESRD on HD, asthma   OT comments  Pt not progressing toward OT goals and goals downgraded in plan of care section. Pt requiring total assist of 2 therapists with bed mobility to sit at EOB. She did demonstrate slight L UE and neck movement this session and withdrew to pain in all extremities. She additionally was able to track therapist and demonstrate focused attention to auditory stimuli briefly. Noted increased spasticity in L hand with fingernails digging into palm. Placed palm protector and educated RN concerning skin checks for irritation. Will follow-up for additional OT visit to ensure safety and functional positioning with L hand in palm protector. Feel pt will best benefit from [facility] post-acute [facility]/C.    Follow Up Recommendations  Supervision/Assistance - 24 hour;LTACH    Equipment Recommendations  Other (comment) (TBD at next venue of care)    Recommendations for Other Services      Precautions / Restrictions Precautions Precautions: Fall Precaution Comments: flexiseal in place Restrictions Weight Bearing Restrictions: No       Mobility Bed Mobility Overal bed mobility: Needs Assistance Bed Mobility: Supine to Sit;Sit to Supine     Supine to sit: Total assist;+2 for physical assistance Sit to supine: Total assist;+2 for physical assistance   General bed mobility comments: Session focused on EOB attempt, patient with no signs of trunk control, weight shift, or ability to perform protective reactions EOB (mechanical  use of bed required)  Transfers                 General transfer comment: Unsafe to attempt this session due to limited engagement in session and inability to maintain balance with no postural reactions.     Balance Overall balance assessment: Needs assistance Sitting-balance support: Feet supported;Bilateral upper extremity supported Sitting balance-Leahy Scale: Zero Sitting balance - Comments: Total assist EOB ~5 mins                                   ADL either performed or assessed with clinical judgement   ADL Overall ADL's : Needs assistance/impaired                             Toileting- Clothing Manipulation and Hygiene: Total assistance;Bed level Toileting - Clothing Manipulation Details (indicate cue type and reason): Pericare completed with total assist required.        General ADL Comments: Some active movement in R UE and neck when repositioning in bed. Unable to complete grasp on command. Remains total assist for ADL. Noted fingernails digging into R palm and increased spasticity. Placed palm protector with skilled education provided to RN concerning regular skin checks.      Vision   Additional Comments: Able to make eye contact x5 this session briefly. Attempted to attend to therapist on the R.    Perception     Praxis      Cognition Arousal/Alertness: Lethargic Behavior During Therapy: Flat affect Overall  Cognitive Status: Difficult to assess Area of Impairment: Following commands                       Following Commands:  (does not follow commands this session)   Awareness:  (pre-intellectual)   General Comments: Pt opening eyes to auditory stimuli and blinking when confrontation with light completed. She did have pain response in all four extremities this session. Did track therapists in room 25% of the time.         Exercises Exercises:  (basic LE ROM bilaterally, limited range from wounds)   Shoulder  Instructions       General Comments multiple wounds noted; pericare performed during session    Pertinent Vitals/ Pain       Pain Assessment: Faces Faces Pain Scale: Hurts a little bit Pain Location: withdraws to pain in all four extremities Pain Descriptors / Indicators: Moaning Pain Intervention(s): Monitored during session  Home Living Family/patient expects to be discharged to:: Skilled nursing facility [facility])                                        Prior Functioning/Environment              Frequency  Min 2X/week        Progress Toward Goals  OT Goals(current goals can now be found in the care plan section)  Progress towards OT goals: Not progressing toward goals - comment;Goals drowngraded-see care plan (unable to engage in ADL)  Acute Rehab OT Goals Patient Stated Goal: none stated OT Goal Formulation: With patient Time For Goal Achievement: 07/24/16 Potential to Achieve Goals: Poor ADL Goals Additional ADL Goal #1: Family will demonstrate ability to functionally position B UE during daily care. Additional ADL Goal #2: Pt's family will demonstrate independence with donning/doffing palm protector and completing skin checks every 2 hours in order to improve functional positioning of L UE.  Plan Discharge plan needs to be updated    Co-evaluation    PT/OT/SLP Co-Evaluation/Treatment: Yes Reason for Co-Treatment: Complexity of the patient's impairments (multi-system involvement) PT goals addressed during session: Mobility/safety with mobility OT goals addressed during session: ADL's and self-care      AM-PAC PT "6 Clicks" Daily Activity     Outcome Measure   Help from another person eating meals?: Total Help from another person taking care of personal grooming?: Total Help from another person toileting, which includes using toliet, bedpan, or urinal?: Total Help from another person bathing (including washing, rinsing, drying)?:  Total Help from another person to put on and taking off regular upper body clothing?: Total Help from another person to put on and taking off regular lower body clothing?: Total 6 Click Score: 6    End of Session    OT Visit Diagnosis: Muscle weakness (generalized) (M62.81);Low vision, both eyes (H54.2);Other symptoms and signs involving the nervous system (R29.898);Other symptoms and signs involving cognitive function;Cognitive communication deficit (R41.841);Hemiplegia and hemiparesis Symptoms and signs involving cognitive functions: Cerebral infarction Hemiplegia - Right/Left:  (both it appears) Hemiplegia - dominant/non-dominant:  (both it appears) Hemiplegia - caused by: Cerebral infarction   Activity Tolerance Patient tolerated treatment well   Patient Left in bed;with call bell/phone within reach   Nurse Communication Mobility status (Skin checks with palm protector)        Time: 1610-9604 OT Time Calculation (min): 30 min  Charges: OT  General Charges $OT Visit: 1 Procedure OT Treatments $Self Care/Home Management : 8-22 mins  Doristine Sectionharity A Lakrisha Iseman, MS OTR/L  Pager: [phone]    Ravina Milner A Yosiel Thieme 07/10/2016, 6:04 PM

## 2016-07-10 NOTE — Progress Notes (Signed)
PT Cancellation Note  Patient Details Name: [name] MRN: [id number] DOB: 12/30/1975   Cancelled Treatment:    Reason Eval/Treat Not Completed: Patient at procedure or test/unavailable, patient remains OTF at this time.   Fabio AsaDevon J Marcayla Budge 07/10/2016, 3:42 PM  Charlotte Crumbevon Suellen Durocher, PT DPT  [phone]

## 2016-07-10 NOTE — Progress Notes (Signed)
PT Cancellation Note  Patient Details Name: Linda DykesDavenia McKinnon [name] MRN: [id number] DOB: 08/19/1975   Cancelled Treatment:     Patient going to HD, will reattempt   Linda AsaDevon J Jaymeson [name] 07/10/2016, 11:06 AM

## 2016-07-10 NOTE — Progress Notes (Signed)
Assessment/Recommendations:  1. 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 Nonverbal. No participation in PT at all.  EEG non-specific. Neuro feels this is likely her new baseline. 2. ESRD- Continuing MWF schedule. Outpt HD no longer an option. Per notes currently no local LTAC options.   3. L post medial thigh soft-tissue wound, sp I&D x 2. ATBs restarted 5/25 d/t spiking fevers, increasing WBC. Surgeons feel non operative candidate.   Subjective: Interval History: low grade temp  Objective: Vital signs in last 24 hours: Temp:  [99.9 F (37.7 C)-100.3 F (37.9 C)] 100.3 F (37.9 C) (05/30 0508) Pulse Rate:  [104-118] 109 (05/30 0508) Resp:  [18-22] 22 (05/30 0508) BP: (100-146)/(42-84) 146/42 (05/30 0508) SpO2:  [96 %-100 %] 100 % (05/30 0508) Weight change:   Intake/Output from previous day: No intake/output data recorded. Intake/Output this shift: No intake/output data recorded.  General appearance: obese, bed bound Eyes: eyes wonder Chest wall: no tenderness Cardio: regular rate and rhythm, S1, S2 normal, no murmur, click, rub or gallop  Lab Results:  Recent Labs  07/08/16 0811 07/10/16 0344  WBC 17.6* 18.8*  HGB 8.0* 8.3*  HCT 26.5* 27.8*  PLT 505* 492*   BMET:  Recent Labs  07/08/16 0620 07/10/16 0344  NA 135 133*  K 4.7 5.2*  CL 96* 95*  CO2 26 26  GLUCOSE 149* 180*  BUN 92* 67*  CREATININE 3.11* 2.62*  CALCIUM 8.7* 8.8*   No results for input(s): PTH in the last 72 hours. Iron Studies: No results for input(s): IRON, TIBC, TRANSFERRIN, FERRITIN in the last 72 hours. Studies/Results: No results found.  Scheduled: . acetaminophen  650 mg Oral TID  . aspirin  325 mg Per Tube Daily  . atorvastatin  20 mg Per Tube QPM  . buPROPion  100 mg Per Tube BID  . chlorhexidine  15 mL Mouth Rinse BID  . collagenase   Topical Daily  . darbepoetin (ARANESP) injection - DIALYSIS  200 mcg  Intravenous Q Wed-HD  . feeding supplement (PRO-STAT SUGAR FREE 64)  30 mL Per Tube QID  . FLUoxetine  40 mg Per Tube Daily  . heparin subcutaneous  5,000 Units Subcutaneous Q8H  . HYDROmorphone  2 mg Per Tube TID  . insulin aspart  0-20 Units Subcutaneous Q4H  . insulin glargine  35 Units Subcutaneous BID  . magic mouthwash  10 mL Oral TID  . metoprolol tartrate  25 mg Per Tube BID  . modafinil  200 mg Oral Daily  . Zinc Oxide   Topical BID    LOS: 32 days   Wanetta Funderburke C 07/10/2016,11:34 AM

## 2016-07-10 NOTE — Progress Notes (Addendum)
@IPLOG         PROGRESS NOTE                                                                                                                                                                                                             Patient Demographics:    [name], is a 41 y.o. female, DOB - 1975/12/19, ZOX:[id number]  Admit date - 06/06/2016   Admitting Physician Michael Litter, MD  Outpatient Primary MD for the patient is Marletta Lor, NP  LOS - 32  Chief Complaint  Patient presents with  . Altered Mental Status       Brief Narrative  [name] is a 41 y.o. female with a history of CVA, ESRD, infected leg ulcer, DM type 2, chronic lymphedema, HTN, chronic diastolic heart failure, depression. She presented with acute altered mental status and found to have new CVAs. She is now nonverbal, unable to move her extremities to verbal commands, currently only tracks with eyes at times. HD is being continued, she is partial code and we are awaiting placement. She has been seen by neurology, nephrology and palliative care this admission. She was transferred to my care on 07/10/2016 on day 32 of her hospital stay.   Subjective:    [name] today In bed appears comfortable but unable to respond to my questions except moving her eyes, appears to be in no distress.   Assessment  & Plan :     1.Obtundation. Due to multiple CVAs, current radiological evidence of Acute and subacute infarcts in the right thalamus, posterior limb internal capsule, and periatrial white matter, progressed from 06/10/2016, advanced remote ischemic injury - at this time prognosis appears to be poor, she remains aphasic, has dysphagia requiring PEG tube, besides eye movement to verbal cues she is unable to follow commands, reportedly she squeezed her daughter's hand a few days ago. However does not seem to have meaningful quality of life at this time. His been seen by neurology along  with palliative care, family wants to continue present line of medical care. She is currently on aspirin and statin via PEG tube. We await placement and further decision by family members. Husband at this time is not showing any interest in her care.  2. Multiple lower extremity wounds. Kindly see wound care notes for details, she has had infection in the wounds prior to this admission as well, has been seen by general surgery team this admission and they are not planning for any surgical debridement. She  was treated with IV antibiotics , latest on Zosyn since 07/05/2016.  3. ESRD. Nephrology following on Monday, Wednesday and Friday schedule.  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  5. Aphasia and dysphagia due to #1 above. Supportive care with PEG tube feeds. May require placement.  6. Hypertension. On beta blocker.  7. Diastolic CHF. EF 55-60%. Currently compensated. On beta blocker, fluid removal via dialysis.  8. Dyslipidemia. On statin continue.   9.DM II. Currently on cognition of Lantus and sliding scale.  Lab Results  Component Value Date   HGBA1C 6.7 (H) 06/09/2016   CBG (last 3)   Recent Labs  07/09/16 2010 07/09/16 2334 07/10/16 0323  GLUCAP 220* 189* 161*     Diet : Diet NPO time specified    Family Communication  :  None  Code Status :  Okay for CPR and medications, no intubation or shocks  Disposition Plan  :  To be decided  Consults  :  Nephrology, neurology, palliative care  Procedures  :  HD Monday Wednesday Friday  DVT Prophylaxis  :   Heparin    Lab Results  Component Value Date   PLT 492 (H) 07/10/2016    Inpatient Medications  Scheduled Meds: . acetaminophen  650 mg Oral TID  . aspirin  325 mg Per Tube Daily  . atorvastatin  20 mg Per Tube QPM  . buPROPion  100 mg Per Tube BID  . chlorhexidine  15 mL Mouth Rinse BID  . collagenase   Topical Daily  . darbepoetin (ARANESP) injection - DIALYSIS  200 mcg Intravenous Q  Wed-HD  . feeding supplement (PRO-STAT SUGAR FREE 64)  30 mL Per Tube QID  . FLUoxetine  40 mg Per Tube Daily  . heparin subcutaneous  5,000 Units Subcutaneous Q8H  . HYDROmorphone  2 mg Per Tube TID  . insulin aspart  0-20 Units Subcutaneous Q4H  . insulin glargine  35 Units Subcutaneous BID  . magic mouthwash  10 mL Oral TID  . metoprolol tartrate  25 mg Per Tube BID  . modafinil  200 mg Oral Daily  . Zinc Oxide   Topical BID   Continuous Infusions: . feeding supplement (OSMOLITE 1.5 CAL) 1,000 mL (07/08/16 1448)  . piperacillin-tazobactam (ZOSYN)  IV 3.375 g (07/10/16 0348)  . sodium chloride     PRN Meds:.[DISCONTINUED] acetaminophen **OR** [DISCONTINUED] acetaminophen (TYLENOL) oral liquid 160 mg/5 mL **OR** acetaminophen, Gerhardt's butt cream, glucagon (human recombinant), hydrALAZINE, HYDROmorphone (DILAUDID) injection, metoprolol tartrate, ondansetron (ZOFRAN) IV  Antibiotics  :    Anti-infectives    Start     Dose/Rate Route Frequency Ordered Stop   07/06/16 0400  piperacillin-tazobactam (ZOSYN) IVPB 3.375 g     3.375 g 12.5 mL/hr over 240 Minutes Intravenous Every 12 hours 07/05/16 1504     07/05/16 1600  piperacillin-tazobactam (ZOSYN) IVPB 3.375 g     3.375 g 100 mL/hr over 30 Minutes Intravenous  Once 07/05/16 1504 07/05/16 1848   06/28/16 0550  piperacillin-tazobactam (ZOSYN) IVPB 3.375 g  Status:  Discontinued     3.375 g 12.5 mL/hr over 240 Minutes Intravenous Every 12 hours 06/27/16 1844 06/29/16 1216   06/21/16 1200  vancomycin (VANCOCIN) IVPB 1000 mg/200 mL premix  Status:  Discontinued     1,000 mg 200 mL/hr over 60 Minutes Intravenous Every M-W-F (Hemodialysis) 06/19/16 1009 06/29/16 1216   06/20/16 0600  ceFAZolin (ANCEF) IVPB 2g/100 mL premix  Status:  Discontinued     2 g 200 mL/hr  over 30 Minutes Intravenous To Radiology 06/19/16 1427 06/21/16 0600   06/19/16 1200  vancomycin (VANCOCIN) IVPB 750 mg/150 ml premix     750 mg 150 mL/hr over 60 Minutes  Intravenous Every M-W-F (Hemodialysis) 06/19/16 1009 06/19/16 1301   06/19/16 1158  Vancomycin (VANCOCIN) 750-5 MG/150ML-% IVPB    Comments:  Herriott, [name]   : cabinet override      06/19/16 1158 06/19/16 1215   07/09/2016 1300  piperacillin-tazobactam (ZOSYN) IVPB 3.375 g  Status:  Discontinued     3.375 g 12.5 mL/hr over 240 Minutes Intravenous Every 12 hours 06/24/2016 1231 06/27/16 1844   06/14/16 0931  vancomycin (VANCOCIN) 1-5 GM/200ML-% IVPB    Comments:  Matilde Haymaker, [name]   : cabinet override      06/14/16 0931 06/14/16 1113   06/12/16 1700  ceFEPIme (MAXIPIME) 1 g in dextrose 5 % 50 mL IVPB  Status:  Discontinued     1 g 100 mL/hr over 30 Minutes Intravenous Every 24 hours 06/12/16 1648 07/06/2016 1205   06/12/16 0959  vancomycin (VANCOCIN) 1-5 GM/200ML-% IVPB    Comments:  Carlyon Prows   : cabinet override      06/12/16 0959 06/12/16 1015   06/10/16 1200  vancomycin (VANCOCIN) IVPB 1000 mg/200 mL premix  Status:  Discontinued     1,000 mg 200 mL/hr over 60 Minutes Intravenous Every M-W-F (Hemodialysis) 06/10/16 0851 06/19/16 1009   06/10/16 1000  piperacillin-tazobactam (ZOSYN) IVPB 3.375 g  Status:  Discontinued     3.375 g 12.5 mL/hr over 240 Minutes Intravenous Every 12 hours 06/10/16 0851 06/12/16 1629   06/10/16 1000  vancomycin (VANCOCIN) 2,250 mg in sodium chloride 0.9 % 500 mL IVPB  Status:  Discontinued     2,250 mg 250 mL/hr over 120 Minutes Intravenous Every 12 hours 06/10/16 0851 06/10/16 0858   06/10/16 1000  vancomycin (VANCOCIN) 2,250 mg in sodium chloride 0.9 % 500 mL IVPB     2,250 mg 250 mL/hr over 120 Minutes Intravenous  Once 06/10/16 0858 06/10/16 1640         Objective:   Vitals:   07/09/16 1807 07/09/16 2117 07/10/16 0126 07/10/16 0508  BP: (!) 142/84 126/84 (!) 113/45 (!) 146/42  Pulse: (!) 118 (!) 117 (!) 104 (!) 109  Resp: (!) 22 (!) 22 (!) 22 (!) 22  Temp: 100 F (37.8 C) 100.3 F (37.9 C) 100.2 F (37.9 C) 100.3 F (37.9 C)  TempSrc:  Axillary Axillary Axillary Axillary  SpO2: 99% 100% 96% 100%  Height:        Wt Readings from Last 3 Encounters:  06/30/16 103.9 kg (229 lb)  05/31/16 135 kg (297 lb 9.9 oz)  03/29/16 (!) 167 kg (368 lb 3.2 oz)    No intake or output data in the 24 hours ending 07/10/16 1056   Physical Exam  Awake , cannot follow any commands or move any extremities except open her eyes to verbal cues [place].AT,PERRAL Supple Neck,No JVD, No cervical lymphadenopathy appriciated.  Symmetrical Chest wall movement, Good air movement bilaterally, CTAB RRR,No Gallops,Rubs or new Murmurs, No Parasternal Heave +ve B.Sounds, Abd Soft, No tenderness, No organomegaly appriciated, No rebound - guarding or rigidity. No Cyanosis, Clubbing or edema, No new Rash or bruise    PEG in place, bilat leg splints, multiple lower extremity ulcers under dressing kindly see wound care nursing notes for details.   Data Review:    CBC  Recent Labs Lab 07/05/16 0845 07/05/16 0900 07/06/16 0234 07/08/16  1610 07/10/16 0344  WBC 20.1* 17.9* 18.9* 17.6* 18.8*  HGB 6.6* 6.7* 9.3* 8.0* 8.3*  HCT 22.0* 22.5* 29.4* 26.5* 27.8*  PLT 548* 528* 491* 505* 492*  MCV 94.8 94.9 94.2 95.3 95.5  MCH 28.4 28.3 29.8 28.8 28.5  MCHC 30.0 29.8* 31.6 30.2 29.9*  RDW 18.0* 18.1* 18.5* 17.5* 17.3*  LYMPHSABS  --   --  3.1  --  3.1  MONOABS  --   --  1.2*  --  1.4*  EOSABS  --   --  0.4  --  0.4  BASOSABS  --   --  0.0  --  0.0    Chemistries   Recent Labs Lab  0358 07/05/16 0845 07/06/16 0234 07/08/16 0620 07/10/16 0344  NA 135 133* 134* 135 133*  K 4.0 4.2 4.2 4.7 5.2*  CL 97* 96* 97* 96* 95*  CO2 28 28 27 26 26   GLUCOSE 134* 193* 206* 149* 180*  BUN 36* 60* 37* 92* 67*  CREATININE 1.59* 2.44* 1.58* 3.11* 2.62*  CALCIUM 8.1* 8.7* 8.5* 8.7* 8.8*  AST  --   --  25  --   --   ALT  --   --  21  --   --   ALKPHOS  --   --  243*  --   --   BILITOT  --   --  0.5  --   --     ------------------------------------------------------------------------------------------------------------------ No results for input(s): CHOL, HDL, LDLCALC, TRIG, CHOLHDL, LDLDIRECT in the last 72 hours.  Lab Results  Component Value Date   HGBA1C 6.7 (H) 06/09/2016   ------------------------------------------------------------------------------------------------------------------ No results for input(s): TSH, T4TOTAL, T3FREE, THYROIDAB in the last 72 hours.  Invalid input(s): FREET3 ------------------------------------------------------------------------------------------------------------------ No results for input(s): VITAMINB12, FOLATE, FERRITIN, TIBC, IRON, RETICCTPCT in the last 72 hours.  Coagulation profile No results for input(s): INR, PROTIME in the last 168 hours.  No results for input(s): DDIMER in the last 72 hours.  Cardiac Enzymes No results for input(s): CKMB, TROPONINI, MYOGLOBIN in the last 168 hours.  Invalid input(s): CK ------------------------------------------------------------------------------------------------------------------    Component Value Date/Time   BNP 207.5 (H) 05/01/2016 1218    Micro Results Recent Results (from the past 240 hour(s))  Culture, blood (Routine X 2) w Reflex to ID Panel     Status: None (Preliminary result)   Collection Time:  12:36 PM  Result Value Ref Range Status   Specimen Description BLOOD RIGHT HAND  Final   Special Requests IN PEDIATRIC BOTTLE Blood Culture adequate volume  Final   Culture NO GROWTH 4 DAYS  Final   Report Status PENDING  Incomplete  Culture, blood (Routine X 2) w Reflex to ID Panel     Status: None   Collection Time:  12:36 PM  Result Value Ref Range Status   Specimen Description BLOOD RIGHT HAND  Final   Special Requests IN PEDIATRIC BOTTLE Blood Culture adequate volume  Final   Culture NO GROWTH 5 DAYS  Final   Report Status 07/09/2016 FINAL  Final    Radiology  Reports Ct Head Wo Contrast  Result Date: 06/18/2016 CLINICAL DATA:  Altered mental status. The patient began spontaneously nonverbal during dialysis 06/10/2016. Acute infarcts on brain MRI 05/28/2016. EXAM: CT HEAD WITHOUT CONTRAST TECHNIQUE: Contiguous axial images were obtained from the base of the skull through the vertex without intravenous contrast. COMPARISON:  Brain MRI and head CT scan 06/02/2016. FINDINGS: Brain: The brain is atrophic with extensive chronic microvascular ischemic  change and multiple remote infarcts bilaterally. Large infarct is again seen in the right cerebellum. No evidence of acute infarct, hemorrhage, mass lesion, mass effect, midline shift or abnormal extra-axial fluid collection. No hydrocephalus or pneumocephalus. Vascular: Atherosclerosis noted. Skull: Intact. Sinuses/Orbits: Negative. Other: None. IMPRESSION: No acute abnormality. Age advanced atrophy and multiple bilateral infarcts as seen on prior exams. Atherosclerosis. Electronically Signed   By: Drusilla Kannerhomas  Dalessio M.D.   On: 06/18/2016 10:24   Ir Gastrostomy Tube Mod Sed  Result Date: 06/20/2016 CLINICAL DATA:  Previous stroke, dysphagia, needs enteral feeding support EXAM: PERC PLACEMENT GASTROSTOMY FLUOROSCOPY TIME:  2.5 minutes, 190 uGym2 DAP TECHNIQUE: The procedure, risks, benefits, and alternatives were explained to the patient and family. Questions regarding the procedure were encouraged and answered. The family understands and consents to the procedure. The patient was already receiving adequate prophylactic IV antibiotic coverage. Safe percutaneous approach was confirmed on a recent CT scan. A 5 French angiographic catheter was placed as orogastric tube. The upper abdomen was prepped with Betadine, draped in usual sterile fashion, and infiltrated locally with 1% lidocaine. Intravenous Fentanyl and Versed were administered as conscious sedation during continuous monitoring of the patient's level of consciousness  and physiological / cardiorespiratory status by the radiology RN, with a total moderate sedation time of 22 minutes. Stomach was insufflated using air through the orogastric tube. An 3218 French sheath needle was advanced percutaneously into the gastric lumen under fluoroscopy. Gas could be aspirated and a small contrast injection confirmed intraluminal spread. The sheath was exchanged over a guidewire for a 9 [place]French vascular sheath, through which the snare device was advanced and used to snare a guidewire passed through the orogastric tube. This was withdrawn, and the snare attached to the 20 French pull-through gastrostomy tube, which was advanced antegrade, positioned with the internal bumper securing the anterior gastric wall to the anterior abdominal wall. Small contrast injection confirms appropriate positioning. The external bumper was applied and the catheter was flushed. COMPLICATIONS: COMPLICATIONS none IMPRESSION: 1. Technically successful 20 French pull-through gastrostomy placement under fluoroscopy. Electronically Signed   By: Corlis Leak  Hassell M.D.   On: 06/20/2016 16:44   Ct Femur Left Wo Contrast  Result Date: 06/27/2016 CLINICAL DATA:  Encephalopathy.  Left leg wound EXAM: CT OF THE LOWER LEFT EXTREMITY WITHOUT CONTRAST TECHNIQUE: Multidetector CT imaging of the lower left extremity was performed according to the standard protocol. COMPARISON:  06/09/2016 FINDINGS: Bones/Joint/Cartilage No acute fracture or dislocation. Normal alignment. No joint effusion. No periosteal reaction or bone destruction. Tricompartmental mild -moderate osteoarthritis of the left knee with joint space narrowing and marginal osteophytosis. Ligaments Ligaments are suboptimally evaluated by CT. Muscles and Tendons Muscles are normal. Soft tissue Soft tissue wound along the inferior medial left thigh with a wound vac present. Soft tissue inflammation in the adjacent subcutaneous fat of the thigh. Soft tissue abnormality along the  upper medial left thigh unchanged compared with the prior examination likely reflecting an area of scarring. No focal fluid collection to suggest an abscess. No soft tissue mass. Peripheral vascular atherosclerotic disease. IMPRESSION: 1. Soft tissue wound along the inferior medial left thigh with a wound vac present. Surrounding cellulitis. No drainable fluid collection to suggest an abscess. 2. Soft tissue abnormality along the upper medial left thigh unchanged compared with the prior examination likely reflecting an area of scarring. Electronically Signed   By: Elige KoHetal  Patel   On: 06/27/2016 14:56   Ir Removal Tun Cv Cath W/o Fl  Result Date: 07/10/2016 INDICATION:  History of end-stage renal disease, now with functional dialysis access. Dialysis catheter was initially placed by Dr. Lowella Dandy on 05/07/2016 and has functioned well throughout duration of usage. EXAM: REMOVAL OF TUNNELED HEMODIALYSIS CATHETER MEDICATIONS: None COMPLICATIONS: None immediate. PROCEDURE: Informed written consent was obtained from the patient's family following an explanation of the procedure, risks, benefits and alternatives to treatment. A time out was performed prior to the initiation of the procedure. Maximal barrier sterile technique was utilized including caps, mask, sterile gowns, sterile gloves, large sterile drape, hand hygiene, and Betadine. 1% lidocaine with epinephrine was injected under sterile conditions along the subcutaneous tunnel. Utilizing a combination of blunt dissection and gentle traction, the catheter was removed intact. Hemostasis was obtained with manual compression. A dressing was placed. The patient tolerated the procedure well without immediate post procedural complication. IMPRESSION: Successful removal of tunneled dialysis catheter. Electronically Signed   By: Simonne Come M.D.   On: 07/09/2016 17:07   Dg Chest Port 1 View  Result Date: 06/25/2016 CLINICAL DATA:  Persistent low-grade fever with dyspnea.  EXAM: PORTABLE CHEST 1 VIEW COMPARISON:  Jun 23, 2016 FINDINGS: Interval removal of right IJ central venous catheter. Lungs are hypoinflated without focal consolidation or effusion. No pneumothorax. Cardiomediastinal silhouette and remainder of the exam is unchanged. IMPRESSION: Hypoinflation without acute cardiopulmonary disease. Electronically Signed   By: Elberta Fortis M.D.   On: 06/25/2016 15:55   Dg Abd Portable 1v  Result Date: 06/13/2016 CLINICAL DATA:  Nasogastric tube placement. EXAM: PORTABLE ABDOMEN - 1 VIEW COMPARISON:  06/10/2016. FINDINGS: Feeding tube extending into the mid stomach and curving back upon itself with its tip in the proximal stomach. Normal bowel gas pattern. Arterial calcifications. No acute bony abnormality. IMPRESSION: Feeding tube tip in the proximal stomach. Electronically Signed   By: Beckie Salts M.D.   On: 06/13/2016 20:02   Mr Brain Limited Wo Contrast  Result Date: 07/02/2016 CLINICAL DATA:  Increased lethargy.  Altered mental status. EXAM: MRI HEAD WITHOUT CONTRAST TECHNIQUE: Multiplanar, multiecho pulse sequences of the brain and surrounding structures were obtained without intravenous contrast. COMPARISON:  Jun 23, 2016 FINDINGS: Brain: Intensely restricted diffusion in the lateral right thalamus and neighboring internal capsule, a 12 mm area. There is less intensely restricted diffusion in the medial right thalamus, periatrial white matter, and anterior right thalamus. The extent of acute and subacute infarct has progressed from 2016-06-23. No superimposed hemorrhage. 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 Remote infarct in the upper left pons and in the left corona radiata. Generalized atrophy with asymmetric wallerian changes seen in the left cortical spinal tracts. No acute hemorrhage, hydrocephalus, or mass. Limited study as requested by clinical team. Diffusion, FLAIR, and T2 weighted imaging acquired. Vascular: No detected change in flow  voids. Skull and upper cervical spine: Negative Sinuses/Orbits: Negative IMPRESSION: 1. Acute and subacute infarcts in the right thalamus, posterior limb internal capsule, and periatrial white matter, progressed from 2016/06/23. 2. Advanced remote ischemic injury. Electronically Signed   By: Marnee Spring M.D.   On: 07/02/2016 11:30    Time Spent in minutes  30   Susa Raring M.D on 07/10/2016 at 10:56 AM  Between 7am to 7pm - Pager - [phone] ( page via [email or web address], text pages only, please mention full 10 digit call back number). After 7pm go to [email or web address] - password [facility]

## 2016-07-10 NOTE — Progress Notes (Addendum)
Changed 2 sacral pressure wound foam bandages, also outer left thigh and inner right thigh all dirty from leaking flex-seal.   All Bandages were changed twice 5/29 by day nurse.

## 2016-07-11 ENCOUNTER — Inpatient Hospital Stay (HOSPITAL_COMMUNITY): Payer: Medicaid Other

## 2016-07-11 LAB — GLUCOSE, CAPILLARY
GLUCOSE-CAPILLARY: 247 mg/dL — AB (ref 65–99)
GLUCOSE-CAPILLARY: 259 mg/dL — AB (ref 65–99)
GLUCOSE-CAPILLARY: 273 mg/dL — AB (ref 65–99)
GLUCOSE-CAPILLARY: 284 mg/dL — AB (ref 65–99)
Glucose-Capillary: 244 mg/dL — ABNORMAL HIGH (ref 65–99)

## 2016-07-11 NOTE — Progress Notes (Signed)
Inpatient Diabetes Program Recommendations  AACE/ADA: New Consensus Statement on Inpatient Glycemic Control (2015)  Target Ranges:  Prepandial:   less than 140 mg/dL      Peak postprandial:   less than 180 mg/dL (1-2 hours)      Critically ill patients:  140 - 180 mg/dL   Results for [name], [name] (MRN [id number]) as of 07/11/2016 09:34  Ref. Range 07/10/2016 03:23 07/10/2016 17:31 07/10/2016 20:11 07/11/2016 00:20 07/11/2016 04:07  Glucose-Capillary Latest Ref Range: 65 - 99 mg/dL 086161 (H) 578169 (H) 469242 (H) 244 (H) 247 (H)   Review of Glycemic Control  Current orders for Inpatient glycemic control: Lantus 35 units BID, Novolog 0-20 units Q4H  Inpatient Diabetes Program Recommendations: Insulin - Tube Feeding Coverage: Please consider ordering Novolog 3 units Q4H for tube feeding coverage. If tube feeding is held or stopped then Novolog tube feeding coverage should be held and/or discontinued.  NOTE: Patient is ordered Osmolite 50 ml/hr which is contributing to hyperglycemia.  Thanks, Orlando PennerMarie Lancer Thurner, RN, MSN, CDE Diabetes Coordinator Inpatient Diabetes Program [phone] (Team Pager from 8am to 5pm)

## 2016-07-11 NOTE — Progress Notes (Signed)
Assessment/Recommendations:  1. 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 Nonverbal. No participation in PT at all.  EEG non-specific. Neuro feels this is likely her new baseline. 2. ESRD- Continuing MWFschedule. Outpt HD no longer an option. Per notes currently no local LTAC options.  3. L post medial thigh soft-tissue wound, sp I&D x 2. ATBs restarted 5/25 d/t spiking fevers, increasing WBC. Surgeons feel non operative candidate  Subjective: Interval History: Fever persists  Objective: Vital signs in last 24 hours: Temp:  [99.2 F (37.3 C)-102.4 F (39.1 C)] 101.4 F (38.6 C) (05/31 1024) Pulse Rate:  [76-132] 91 (05/31 1024) Resp:  [20-28] 23 (05/31 1024) BP: (97-149)/(42-103) 109/71 (05/31 1024) SpO2:  [96 %-100 %] 97 % (05/31 1024) Weight change:   Intake/Output from previous day: 05/30 0701 - 05/31 0700 In: 4063.3 [NG/GT:3563.3; IV Piggyback:300] Out: 2500  Intake/Output this shift: No intake/output data recorded.  General appearance:no appropriate behavior  Eyes: wonder some Resp: a few basilar crackles Chest wall:nt GI: Obese with no mass nonverbal  Lab Results:  Recent Labs  07/10/16 0344  WBC 18.8*  HGB 8.3*  HCT 27.8*  PLT 492*   BMET:  Recent Labs  07/10/16 0344  NA 133*  K 5.2*  CL 95*  CO2 26  GLUCOSE 180*  BUN 67*  CREATININE 2.62*  CALCIUM 8.8*   No results for input(s): PTH in the last 72 hours. Iron Studies: No results for input(s): IRON, TIBC, TRANSFERRIN, FERRITIN in the last 72 hours. Studies/Results: Dg Chest Port 1 View  Result Date: 07/11/2016 CLINICAL DATA:  Shortness of breath EXAM: PORTABLE CHEST 1 VIEW COMPARISON:  Portable chest x-ray of Jun 25, 2016 FINDINGS: The right hemidiaphragm remains elevated with resultant right lung hypoinflation. The left lung is better inflated. The retrocardiac lung markings are more conspicuous today. The cardiac  silhouette is enlarged. The central pulmonary vascularity is mildly prominent though not greatly changed. The mediastinum is normal in width. There is no pleural effusion. IMPRESSION: Increased retrocardiac density on the left compatible with atelectasis or early pneumonia. There is probable low-grade CHF as well. Followup PA and lateral chest X-ray is recommended in 3-4 weeks following trial of antibiotic therapy to ensure resolution and exclude underlying malignancy. Electronically Signed   By: David  SwazilandJordan M.D.   On: 07/11/2016 07:43   Dg Abd Portable 1v  Result Date: 07/11/2016 CLINICAL DATA:  Diarrhea EXAM: PORTABLE ABDOMEN - 1 VIEW COMPARISON:  Jun 13, 2016 and Jun 20, 2016 FINDINGS: Gastrostomy catheter noted in left upper abdomen. There is no bowel dilatation or air-fluid level to suggest bowel obstruction. No evident free air. Visualized lung bases are clear. There are phleboliths pelvis as well as foci of iliac artery calcification. IMPRESSION: Gastrostomy catheter in the upper abdomen. No bowel obstruction or free air. Iliac artery atherosclerosis noted. Electronically Signed   By: Bretta BangWilliam  Woodruff III M.D.   On: 07/11/2016 10:56    Scheduled: . acetaminophen  650 mg Oral TID  . aspirin  325 mg Per Tube Daily  . atorvastatin  20 mg Per Tube QPM  . buPROPion  100 mg Per Tube BID  . chlorhexidine  15 mL Mouth Rinse BID  . collagenase   Topical Daily  . darbepoetin (ARANESP) injection - DIALYSIS  200 mcg Intravenous Q Wed-HD  . feeding supplement (PRO-STAT SUGAR FREE 64)  30 mL Per Tube QID  . FLUoxetine  40 mg Per Tube Daily  .  heparin subcutaneous  5,000 Units Subcutaneous Q8H  . HYDROmorphone  2 mg Per Tube TID  . insulin aspart  0-20 Units Subcutaneous Q4H  . insulin glargine  35 Units Subcutaneous BID  . magic mouthwash  10 mL Oral TID  . metoprolol tartrate  25 mg Per Tube BID  . modafinil  200 mg Oral Daily  . Zinc Oxide   Topical BID      LOS: 33 days   Akila Batta  C 07/11/2016,1:34 PM

## 2016-07-11 NOTE — Progress Notes (Signed)
Changed dressings on patients sacral area due to fecal contamination, reinforced dressing on thigh which was changed three times  5/30, flex-seal does not appear to be working well as the feces is not liquid enough to transport and is pushing past seal an rectal wall.

## 2016-07-11 NOTE — Progress Notes (Addendum)
 @IPLOG         PROGRESS NOTE                                                                                                                                                                                                             Patient Demographics:    [name], is a 41 y.o. female, DOB - 02/21/1975, AVW:[id number]  Admit date - August 07, 2016   Admitting Physician Michael LitterNikki Carter, MD  Outpatient Primary MD for the patient is Marletta LorBarr, Julie, NP  LOS - 33  Chief Complaint  Patient presents with  . Altered Mental Status       Brief Narrative  [name] is a 41 y.o. female with a history of CVA, ESRD, infected leg ulcer, DM type 2, chronic lymphedema, HTN, chronic diastolic heart failure, depression. She presented with acute altered mental status and found to have new CVAs. She is now nonverbal, unable to move her extremities to verbal commands, currently only tracks with eyes at times. HD is being continued, she is partial code and we are awaiting placement. She has been seen by neurology, nephrology and palliative care this admission. She was transferred to my care on 07/10/2016 on day 32 of her hospital stay.   Subjective:    [name] todayIn bed, appears to be in no distress, unable to answer questions or follow commands   Assessment  & Plan :     1.Obtundation. Due to multiple CVAs, current radiological evidence of Acute and subacute infarcts in the right thalamus, posterior limb internal capsule, and periatrial white matter, progressed from 0June 27, 2018, advanced remote ischemic injury - at this time prognosis appears to be poor, she remains aphasic, has dysphagia requiring PEG tube, besides eye movement to verbal cues she is unable to follow commands, reportedly she squeezed her daughter's hand a few days ago. However does not seem to have meaningful quality of life at this time. His been seen by neurology along with palliative care, family wants  to continue present line of medical care. She is currently on aspirin and statin via PEG tube. We await placement and further decision by family members. Husband at this time is not showing any interest in her care.  2. Multiple lower extremity wounds. Kindly see wound care notes for details, she has had infection in the wounds prior to this admission as well, has been seen by general surgery team this admission and they are not planning for any surgical debridement. She was treated with IV antibiotics , latest  on Zosyn since 07/05/2016.  3. ESRD. Nephrology on board currently on M,W, F dialysis schedule.  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  5. Aphasia and dysphagia due to #1 above. Currently nothing by mouth with all feeding and medications via PEG tube.  6. Hypertension. On beta blocker.  7. Diastolic CHF. EF 55-60%. Currently compensated. On beta blocker, fluid removal via dialysis.  8. Dyslipidemia. On statin continue.   9. Fevers starting 07/10/2016 night. On IV Zosyn for #2 above, chest x-ray suggestive of possible re-aspiration, we will obtain blood cultures, rule out C. difficile, add vancomycin for now and follow culture results and C. difficile PCR results. Note earlier in the admission around 06/18/2016 there was a suspicion of vegetation and endocarditis this was ruled out by a TEE which did not show any growth. She has also been seen by ID earlier during this admission.  10. DM II. Currently on cognition of Lantus and sliding scale.  Lab Results  Component Value Date   HGBA1C 6.7 (H) 06/09/2016   CBG (last 3)   Recent Labs  07/10/16 2011 07/11/16 0020 07/11/16 0407  GLUCAP 242* 244* 247*     Diet : Diet NPO time specified    Family Communication  :  None  Code Status :  Okay for CPR and medications, no intubation or shocks  Disposition Plan  :  To be decided  Consults  :  Nephrology, neurology, palliative care, ID, Cards  Procedures  :  HD  Monday Wednesday Friday  DVT Prophylaxis  :   Heparin    Lab Results  Component Value Date   PLT 492 (H) 07/10/2016    Inpatient Medications  Scheduled Meds: . acetaminophen  650 mg Oral TID  . aspirin  325 mg Per Tube Daily  . atorvastatin  20 mg Per Tube QPM  . buPROPion  100 mg Per Tube BID  . chlorhexidine  15 mL Mouth Rinse BID  . collagenase   Topical Daily  . darbepoetin (ARANESP) injection - DIALYSIS  200 mcg Intravenous Q Wed-HD  . feeding supplement (PRO-STAT SUGAR FREE 64)  30 mL Per Tube QID  . FLUoxetine  40 mg Per Tube Daily  . heparin subcutaneous  5,000 Units Subcutaneous Q8H  . HYDROmorphone  2 mg Per Tube TID  . insulin aspart  0-20 Units Subcutaneous Q4H  . insulin glargine  35 Units Subcutaneous BID  . magic mouthwash  10 mL Oral TID  . metoprolol tartrate  25 mg Per Tube BID  . modafinil  200 mg Oral Daily  . Zinc Oxide   Topical BID   Continuous Infusions: . feeding supplement (OSMOLITE 1.5 CAL) 1,000 mL (07/10/16 1730)  . piperacillin-tazobactam (ZOSYN)  IV 3.375 g (07/11/16 0416)  . sodium chloride     PRN Meds:.[DISCONTINUED] acetaminophen **OR** [DISCONTINUED] acetaminophen (TYLENOL) oral liquid 160 mg/5 mL **OR** acetaminophen, Gerhardt's butt cream, glucagon (human recombinant), hydrALAZINE, HYDROmorphone (DILAUDID) injection, metoprolol tartrate, ondansetron (ZOFRAN) IV  Antibiotics  :    Anti-infectives    Start     Dose/Rate Route Frequency Ordered Stop   07/06/16 0400  piperacillin-tazobactam (ZOSYN) IVPB 3.375 g     3.375 g 12.5 mL/hr over 240 Minutes Intravenous Every 12 hours 07/05/16 1504     07/05/16 1600  piperacillin-tazobactam (ZOSYN) IVPB 3.375 g     3.375 g 100 mL/hr over 30 Minutes Intravenous  Once 07/05/16 1504 07/05/16 1848   06/28/16 0550  piperacillin-tazobactam (ZOSYN) IVPB 3.375 g  Status:  Discontinued     3.375 g 12.5 mL/hr over 240 Minutes Intravenous Every 12 hours 06/27/16 1844 06/29/16 1216   06/21/16 1200   vancomycin (VANCOCIN) IVPB 1000 mg/200 mL premix  Status:  Discontinued     1,000 mg 200 mL/hr over 60 Minutes Intravenous Every M-W-F (Hemodialysis) 06/19/16 1009 06/29/16 1216   06/20/16 0600  ceFAZolin (ANCEF) IVPB 2g/100 mL premix  Status:  Discontinued     2 g 200 mL/hr over 30 Minutes Intravenous To Radiology 06/19/16 1427 06/21/16 0600   06/19/16 1200  vancomycin (VANCOCIN) IVPB 750 mg/150 ml premix     750 mg 150 mL/hr over 60 Minutes Intravenous Every M-W-F (Hemodialysis) 06/19/16 1009 06/19/16 1301   06/19/16 1158  Vancomycin (VANCOCIN) 750-5 MG/150ML-% IVPB    Comments:  Herriott, [name]   : cabinet override      06/19/16 1158 06/19/16 1215   2016-06-26 1300  piperacillin-tazobactam (ZOSYN) IVPB 3.375 g  Status:  Discontinued     3.375 g 12.5 mL/hr over 240 Minutes Intravenous Every 12 hours 06/26/2016 1231 06/27/16 1844   06/14/16 0931  vancomycin (VANCOCIN) 1-5 GM/200ML-% IVPB    Comments:  Matilde Haymaker, [name]   : cabinet override      06/14/16 0931 06/14/16 1113   06/12/16 1700  ceFEPIme (MAXIPIME) 1 g in dextrose 5 % 50 mL IVPB  Status:  Discontinued     1 g 100 mL/hr over 30 Minutes Intravenous Every 24 hours 06/12/16 1648 2016-06-26 1205   06/12/16 0959  vancomycin (VANCOCIN) 1-5 GM/200ML-% IVPB    Comments:  Carlyon Prows   : cabinet override      06/12/16 0959 06/12/16 1015   06/10/16 1200  vancomycin (VANCOCIN) IVPB 1000 mg/200 mL premix  Status:  Discontinued     1,000 mg 200 mL/hr over 60 Minutes Intravenous Every M-W-F (Hemodialysis) 06/10/16 0851 06/19/16 1009   06/10/16 1000  piperacillin-tazobactam (ZOSYN) IVPB 3.375 g  Status:  Discontinued     3.375 g 12.5 mL/hr over 240 Minutes Intravenous Every 12 hours 06/10/16 0851 06/12/16 1629   06/10/16 1000  vancomycin (VANCOCIN) 2,250 mg in sodium chloride 0.9 % 500 mL IVPB  Status:  Discontinued     2,250 mg 250 mL/hr over 120 Minutes Intravenous Every 12 hours 06/10/16 0851 06/10/16 0858   06/10/16 1000  vancomycin  (VANCOCIN) 2,250 mg in sodium chloride 0.9 % 500 mL IVPB     2,250 mg 250 mL/hr over 120 Minutes Intravenous  Once 06/10/16 0858 06/10/16 1640         Objective:   Vitals:   07/10/16 1802 07/10/16 2123 07/11/16 0150 07/11/16 0621  BP: (!) 149/103 (!) 113/53 104/62 97/73  Pulse: (!) 130 (!) 121 (!) 112 (!) 125  Resp: (!) 24 (!) 26 20 20   Temp: (!) 102 F (38.9 C) (!) 102.2 F (39 C) (!) 101.7 F (38.7 C) (!) 102.4 F (39.1 C)  TempSrc: Oral Axillary Axillary Axillary  SpO2: 100% 100% 100% 100%  Height:        Wt Readings from Last 3 Encounters:  06/30/16 103.9 kg (229 lb)  05/31/16 135 kg (297 lb 9.9 oz)  03/29/16 (!) 167 kg (368 lb 3.2 oz)     Intake/Output Summary (Last 24 hours) at 07/11/16 0940 Last data filed at 07/10/16 2216  Gross per 24 hour  Intake          4063.33 ml  Output             2500  ml  Net          1563.33 ml     Physical Exam  awake, will move eyes on verbal commands, [place].AT,PERRAL Supple Neck,No JVD, No cervical lymphadenopathy appriciated.  Symmetrical Chest wall movement, Good air movement bilaterally, CTAB RRR,No Gallops,Rubs or new Murmurs, No Parasternal Heave +ve B.Sounds, Abd Soft, No tenderness, No organomegaly appriciated, No rebound - guarding or rigidity. No Cyanosis, Clubbing or edema, No new Rash or bruise   PEG in place, bilat leg splints, rectal tube in place with liquid stool, multiple lower extremity ulcers under dressing kindly see wound care nursing notes for details.   Data Review:    CBC  Recent Labs Lab 07/05/16 0845 07/05/16 0900 07/06/16 0234 07/08/16 0811 07/10/16 0344  WBC 20.1* 17.9* 18.9* 17.6* 18.8*  HGB 6.6* 6.7* 9.3* 8.0* 8.3*  HCT 22.0* 22.5* 29.4* 26.5* 27.8*  PLT 548* 528* 491* 505* 492*  MCV 94.8 94.9 94.2 95.3 95.5  MCH 28.4 28.3 29.8 28.8 28.5  MCHC 30.0 29.8* 31.6 30.2 29.9*  RDW 18.0* 18.1* 18.5* 17.5* 17.3*  LYMPHSABS  --   --  3.1  --  3.1  MONOABS  --   --  1.2*  --  1.4*  EOSABS   --   --  0.4  --  0.4  BASOSABS  --   --  0.0  --  0.0    Chemistries   Recent Labs Lab 07/05/16 0845 07/06/16 0234 07/08/16 0620 07/10/16 0344  NA 133* 134* 135 133*  K 4.2 4.2 4.7 5.2*  CL 96* 97* 96* 95*  CO2 28 27 26 26   GLUCOSE 193* 206* 149* 180*  BUN 60* 37* 92* 67*  CREATININE 2.44* 1.58* 3.11* 2.62*  CALCIUM 8.7* 8.5* 8.7* 8.8*  AST  --  25  --   --   ALT  --  21  --   --   ALKPHOS  --  243*  --   --   BILITOT  --  0.5  --   --    ------------------------------------------------------------------------------------------------------------------ No results for input(s): CHOL, HDL, LDLCALC, TRIG, CHOLHDL, LDLDIRECT in the last 72 hours.  Lab Results  Component Value Date   HGBA1C 6.7 (H) 06/09/2016   ------------------------------------------------------------------------------------------------------------------ No results for input(s): TSH, T4TOTAL, T3FREE, THYROIDAB in the last 72 hours.  Invalid input(s): FREET3 ------------------------------------------------------------------------------------------------------------------ No results for input(s): VITAMINB12, FOLATE, FERRITIN, TIBC, IRON, RETICCTPCT in the last 72 hours.  Coagulation profile No results for input(s): INR, PROTIME in the last 168 hours.  No results for input(s): DDIMER in the last 72 hours.  Cardiac Enzymes No results for input(s): CKMB, TROPONINI, MYOGLOBIN in the last 168 hours.  Invalid input(s): CK ------------------------------------------------------------------------------------------------------------------    Component Value Date/Time   BNP 207.5 (H) 05/01/2016 1218    Micro Results Recent Results (from the past 240 hour(s))  Culture, blood (Routine X 2) w Reflex to ID Panel     Status: None   Collection Time:  12:36 PM  Result Value Ref Range Status   Specimen Description BLOOD RIGHT HAND  Final   Special Requests IN PEDIATRIC BOTTLE Blood Culture adequate volume   Final   Culture NO GROWTH 5 DAYS  Final   Report Status 07/10/2016 FINAL  Final  Culture, blood (Routine X 2) w Reflex to ID Panel     Status: None   Collection Time:  12:36 PM  Result Value Ref Range Status   Specimen Description BLOOD RIGHT HAND  Final  Special Requests IN PEDIATRIC BOTTLE Blood Culture adequate volume  Final   Culture NO GROWTH 5 DAYS  Final   Report Status 07/09/2016 FINAL  Final    Radiology Reports Ct Head Wo Contrast  Result Date: 06/18/2016 CLINICAL DATA:  Altered mental status. The patient began spontaneously nonverbal during dialysis 05/21/2016. Acute infarcts on brain MRI 05/21/2016. EXAM: CT HEAD WITHOUT CONTRAST TECHNIQUE: Contiguous axial images were obtained from the base of the skull through the vertex without intravenous contrast. COMPARISON:  Brain MRI and head CT scan 05/25/2016. FINDINGS: Brain: The brain is atrophic with extensive chronic microvascular ischemic change and multiple remote infarcts bilaterally. Large infarct is again seen in the right cerebellum. No evidence of acute infarct, hemorrhage, mass lesion, mass effect, midline shift or abnormal extra-axial fluid collection. No hydrocephalus or pneumocephalus. Vascular: Atherosclerosis noted. Skull: Intact. Sinuses/Orbits: Negative. Other: None. IMPRESSION: No acute abnormality. Age advanced atrophy and multiple bilateral infarcts as seen on prior exams. Atherosclerosis. Electronically Signed   By: Drusilla Kanner M.D.   On: 06/18/2016 10:24   Ir Gastrostomy Tube Mod Sed  Result Date: 06/20/2016 CLINICAL DATA:  Previous stroke, dysphagia, needs enteral feeding support EXAM: PERC PLACEMENT GASTROSTOMY FLUOROSCOPY TIME:  2.5 minutes, 190 uGym2 DAP TECHNIQUE: The procedure, risks, benefits, and alternatives were explained to the patient and family. Questions regarding the procedure were encouraged and answered. The family understands and consents to the procedure. The patient was already  receiving adequate prophylactic IV antibiotic coverage. Safe percutaneous approach was confirmed on a recent CT scan. A 5 French angiographic catheter was placed as orogastric tube. The upper abdomen was prepped with Betadine, draped in usual sterile fashion, and infiltrated locally with 1% lidocaine. Intravenous Fentanyl and Versed were administered as conscious sedation during continuous monitoring of the patient's level of consciousness and physiological / cardiorespiratory status by the radiology RN, with a total moderate sedation time of 22 minutes. Stomach was insufflated using air through the orogastric tube. An 14 French sheath needle was advanced percutaneously into the gastric lumen under fluoroscopy. Gas could be aspirated and a small contrast injection confirmed intraluminal spread. The sheath was exchanged over a guidewire for a 9 [place] vascular sheath, through which the snare device was advanced and used to snare a guidewire passed through the orogastric tube. This was withdrawn, and the snare attached to the 20 French pull-through gastrostomy tube, which was advanced antegrade, positioned with the internal bumper securing the anterior gastric wall to the anterior abdominal wall. Small contrast injection confirms appropriate positioning. The external bumper was applied and the catheter was flushed. COMPLICATIONS: COMPLICATIONS none IMPRESSION: 1. Technically successful 20 French pull-through gastrostomy placement under fluoroscopy. Electronically Signed   By: Corlis Leak M.D.   On: 06/20/2016 16:44   Ct Femur Left Wo Contrast  Result Date: 06/27/2016 CLINICAL DATA:  Encephalopathy.  Left leg wound EXAM: CT OF THE LOWER LEFT EXTREMITY WITHOUT CONTRAST TECHNIQUE: Multidetector CT imaging of the lower left extremity was performed according to the standard protocol. COMPARISON:  06/09/2016 FINDINGS: Bones/Joint/Cartilage No acute fracture or dislocation. Normal alignment. No joint effusion. No  periosteal reaction or bone destruction. Tricompartmental mild -moderate osteoarthritis of the left knee with joint space narrowing and marginal osteophytosis. Ligaments Ligaments are suboptimally evaluated by CT. Muscles and Tendons Muscles are normal. Soft tissue Soft tissue wound along the inferior medial left thigh with a wound vac present. Soft tissue inflammation in the adjacent subcutaneous fat of the thigh. Soft tissue abnormality along the upper medial left  thigh unchanged compared with the prior examination likely reflecting an area of scarring. No focal fluid collection to suggest an abscess. No soft tissue mass. Peripheral vascular atherosclerotic disease. IMPRESSION: 1. Soft tissue wound along the inferior medial left thigh with a wound vac present. Surrounding cellulitis. No drainable fluid collection to suggest an abscess. 2. Soft tissue abnormality along the upper medial left thigh unchanged compared with the prior examination likely reflecting an area of scarring. Electronically Signed   By: Elige Ko   On: 06/27/2016 14:56   Ir Removal Tun Cv Cath W/o Fl  Result Date: 06/16/2016 INDICATION: History of end-stage renal disease, now with functional dialysis access. Dialysis catheter was initially placed by Dr. Lowella Dandy on 05/07/2016 and has functioned well throughout duration of usage. EXAM: REMOVAL OF TUNNELED HEMODIALYSIS CATHETER MEDICATIONS: None COMPLICATIONS: None immediate. PROCEDURE: Informed written consent was obtained from the patient's family following an explanation of the procedure, risks, benefits and alternatives to treatment. A time out was performed prior to the initiation of the procedure. Maximal barrier sterile technique was utilized including caps, mask, sterile gowns, sterile gloves, large sterile drape, hand hygiene, and Betadine. 1% lidocaine with epinephrine was injected under sterile conditions along the subcutaneous tunnel. Utilizing a combination of blunt dissection and  gentle traction, the catheter was removed intact. Hemostasis was obtained with manual compression. A dressing was placed. The patient tolerated the procedure well without immediate post procedural complication. IMPRESSION: Successful removal of tunneled dialysis catheter. Electronically Signed   By: Simonne Come M.D.   On: 06/19/2016 17:07   Dg Chest Port 1 View  Result Date: 07/11/2016 CLINICAL DATA:  Shortness of breath EXAM: PORTABLE CHEST 1 VIEW COMPARISON:  Portable chest x-ray of Jun 25, 2016 FINDINGS: The right hemidiaphragm remains elevated with resultant right lung hypoinflation. The left lung is better inflated. The retrocardiac lung markings are more conspicuous today. The cardiac silhouette is enlarged. The central pulmonary vascularity is mildly prominent though not greatly changed. The mediastinum is normal in width. There is no pleural effusion. IMPRESSION: Increased retrocardiac density on the left compatible with atelectasis or early pneumonia. There is probable low-grade CHF as well. Followup PA and lateral chest X-ray is recommended in 3-4 weeks following trial of antibiotic therapy to ensure resolution and exclude underlying malignancy. Electronically Signed   By: David  [place] M.D.   On: 07/11/2016 07:43   Dg Chest Port 1 View  Result Date: 06/25/2016 CLINICAL DATA:  Persistent low-grade fever with dyspnea. EXAM: PORTABLE CHEST 1 VIEW COMPARISON:  05/15/2016 FINDINGS: Interval removal of right IJ central venous catheter. Lungs are hypoinflated without focal consolidation or effusion. No pneumothorax. Cardiomediastinal silhouette and remainder of the exam is unchanged. IMPRESSION: Hypoinflation without acute cardiopulmonary disease. Electronically Signed   By: Elberta Fortis M.D.   On: 06/25/2016 15:55   Dg Abd Portable 1v  Result Date: 06/13/2016 CLINICAL DATA:  Nasogastric tube placement. EXAM: PORTABLE ABDOMEN - 1 VIEW COMPARISON:  06/10/2016. FINDINGS: Feeding tube extending into the  mid stomach and curving back upon itself with its tip in the proximal stomach. Normal bowel gas pattern. Arterial calcifications. No acute bony abnormality. IMPRESSION: Feeding tube tip in the proximal stomach. Electronically Signed   By: Beckie Salts M.D.   On: 06/13/2016 20:02   Mr Brain Limited Wo Contrast  Result Date: 07/02/2016 CLINICAL DATA:  Increased lethargy.  Altered mental status. EXAM: MRI HEAD WITHOUT CONTRAST TECHNIQUE: Multiplanar, multiecho pulse sequences of the brain and surrounding structures were obtained without intravenous  contrast. COMPARISON:  06/04/2016 FINDINGS: Brain: Intensely restricted diffusion in the lateral right thalamus and neighboring internal capsule, a 12 mm area. There is less intensely restricted diffusion in the medial right thalamus, periatrial white matter, and anterior right thalamus. The extent of acute and subacute infarct has progressed from 05/23/2016. No superimposed hemorrhage. 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 Remote infarct in the upper left pons and in the left corona radiata. Generalized atrophy with asymmetric wallerian changes seen in the left cortical spinal tracts. No acute hemorrhage, hydrocephalus, or mass. Limited study as requested by clinical team. Diffusion, FLAIR, and T2 weighted imaging acquired. Vascular: No detected change in flow voids. Skull and upper cervical spine: Negative Sinuses/Orbits: Negative IMPRESSION: 1. Acute and subacute infarcts in the right thalamus, posterior limb internal capsule, and periatrial white matter, progressed from 05/19/2016. 2. Advanced remote ischemic injury. Electronically Signed   By: Marnee Spring M.D.   On: 07/02/2016 11:30    Time Spent in minutes  30   Susa Raring M.D on 07/11/2016 at 9:40 AM  Between 7am to 7pm - Pager - [phone] ( page via [email or web address], text pages only, please mention full 10 digit call back number). After 7pm go to [email or web address] - password [facility]

## 2016-07-12 LAB — GLUCOSE, CAPILLARY
GLUCOSE-CAPILLARY: 221 mg/dL — AB (ref 65–99)
GLUCOSE-CAPILLARY: 224 mg/dL — AB (ref 65–99)
Glucose-Capillary: 213 mg/dL — ABNORMAL HIGH (ref 65–99)
Glucose-Capillary: 156 mg/dL — ABNORMAL HIGH (ref 65–99)
Glucose-Capillary: 210 mg/dL — ABNORMAL HIGH (ref 65–99)

## 2016-07-12 LAB — CBC
HEMATOCRIT: 23.5 % — AB (ref 36.0–46.0)
HEMOGLOBIN: 7.4 g/dL — AB (ref 12.0–15.0)
MCH: 30.2 pg (ref 26.0–34.0)
MCHC: 31.5 g/dL (ref 30.0–36.0)
MCV: 95.9 fL (ref 78.0–100.0)
Platelets: 501 10*3/uL — ABNORMAL HIGH (ref 150–400)
RBC: 2.45 MIL/uL — AB (ref 3.87–5.11)
RDW: 17.3 % — ABNORMAL HIGH (ref 11.5–15.5)
WBC: 23.6 10*3/uL — AB (ref 4.0–10.5)

## 2016-07-12 LAB — VANCOMYCIN, RANDOM
VANCOMYCIN RM: 37
Vancomycin Rm: 38

## 2016-07-12 LAB — BLOOD CULTURE ID PANEL (REFLEXED)
Acinetobacter baumannii: NOT DETECTED
CANDIDA ALBICANS: NOT DETECTED
CANDIDA GLABRATA: NOT DETECTED
CANDIDA KRUSEI: NOT DETECTED
CANDIDA PARAPSILOSIS: NOT DETECTED
CANDIDA TROPICALIS: NOT DETECTED
ENTEROBACTER CLOACAE COMPLEX: NOT DETECTED
ENTEROBACTERIACEAE SPECIES: NOT DETECTED
Enterococcus species: NOT DETECTED
Escherichia coli: NOT DETECTED
HAEMOPHILUS INFLUENZAE: NOT DETECTED
KLEBSIELLA OXYTOCA: NOT DETECTED
KLEBSIELLA PNEUMONIAE: NOT DETECTED
Listeria monocytogenes: NOT DETECTED
Methicillin resistance: DETECTED — AB
Neisseria meningitidis: NOT DETECTED
Proteus species: NOT DETECTED
Pseudomonas aeruginosa: NOT DETECTED
STREPTOCOCCUS PYOGENES: NOT DETECTED
Serratia marcescens: NOT DETECTED
Staphylococcus aureus (BCID): NOT DETECTED
Staphylococcus species: DETECTED — AB
Streptococcus agalactiae: NOT DETECTED
Streptococcus pneumoniae: NOT DETECTED
Streptococcus species: NOT DETECTED

## 2016-07-12 LAB — FERRITIN

## 2016-07-12 LAB — RENAL FUNCTION PANEL
Albumin: <1 g/dL — ABNORMAL LOW (ref 3.5–5.0)
Anion gap: 10 (ref 5–15)
BUN: 54 mg/dL — ABNORMAL HIGH (ref 6–20)
CHLORIDE: 95 mmol/L — AB (ref 101–111)
CO2: 27 mmol/L (ref 22–32)
CREATININE: 2.44 mg/dL — AB (ref 0.44–1.00)
Calcium: 8.6 mg/dL — ABNORMAL LOW (ref 8.9–10.3)
GFR calc Af Amer: 28 mL/min — ABNORMAL LOW (ref >60)
GFR calc non Af Amer: 24 mL/min — ABNORMAL LOW (ref >60)
GLUCOSE: 171 mg/dL — AB (ref 65–99)
Phosphorus: 2.5 mg/dL (ref 2.5–4.6)
Potassium: 3.5 mmol/L (ref 3.5–5.1)
Sodium: 132 mmol/L — ABNORMAL LOW (ref 135–145)

## 2016-07-12 LAB — C DIFFICILE QUICK SCREEN W PCR REFLEX
C Diff antigen: NEGATIVE
C Diff interpretation: NOT DETECTED
C Diff toxin: NEGATIVE

## 2016-07-12 LAB — IRON AND TIBC: Iron: 37 ug/dL (ref 28–170)

## 2016-07-12 MED ORDER — HEPARIN SODIUM (PORCINE) 1000 UNIT/ML DIALYSIS: 20.0000 [IU]/kg | INTRAMUSCULAR | Status: DC | PRN

## 2016-07-12 MED ORDER — SODIUM THIOSULFATE 25 % IV SOLN
25.0000 g | INTRAVENOUS | Status: DC
  Filled 2016-07-12: qty 100

## 2016-07-12 MED ORDER — PENTAFLUOROPROP-TETRAFLUOROETH EX AERO: 1.0000 "application " | INHALATION_SPRAY | CUTANEOUS | Status: DC | PRN

## 2016-07-12 MED ORDER — VANCOMYCIN HCL 10 G IV SOLR
2000.0000 mg | INTRAVENOUS | Status: AC
  Administered 2016-07-12: 2000 mg via INTRAVENOUS
  Filled 2016-07-12: qty 2000

## 2016-07-12 MED ORDER — LIDOCAINE-PRILOCAINE 2.5-2.5 % EX CREA: 1.0000 "application " | TOPICAL_CREAM | CUTANEOUS | Status: DC | PRN

## 2016-07-12 MED ORDER — VANCOMYCIN HCL IN DEXTROSE 1-5 GM/200ML-% IV SOLN
1000.0000 mg | INTRAVENOUS | Status: DC
  Administered 2016-07-12 – 2016-07-15 (×2): 1000 mg via INTRAVENOUS
  Filled 2016-07-12 (×2): qty 200

## 2016-07-12 MED ORDER — SODIUM THIOSULFATE 25 % IV SOLN
25.0000 g | Freq: Once | INTRAVENOUS | Status: AC
  Administered 2016-07-12: 25 g via INTRAVENOUS
  Filled 2016-07-12: qty 100

## 2016-07-12 MED ORDER — SODIUM CHLORIDE 0.9 % IV SOLN: 100.0000 mL | INTRAVENOUS | Status: DC | PRN

## 2016-07-12 MED ORDER — VITAL AF 1.2 CAL PO LIQD
1000.0000 mL | ORAL | Status: DC
  Administered 2016-07-12: 1000 mL
  Filled 2016-07-12: qty 1000

## 2016-07-12 MED ORDER — METOPROLOL TARTRATE 5 MG/5ML IV SOLN
INTRAVENOUS | Status: AC
  Filled 2016-07-12: qty 5

## 2016-07-12 MED ORDER — RENA-VITE PO TABS
1.0000 | ORAL_TABLET | Freq: Every day | ORAL | Status: DC
  Administered 2016-07-12 – 2016-07-15 (×4): 1 via ORAL
  Filled 2016-07-12 (×4): qty 1

## 2016-07-12 MED ORDER — ALTEPLASE 2 MG IJ SOLR: 2.0000 mg | Freq: Once | INTRAMUSCULAR | Status: DC | PRN

## 2016-07-12 MED ORDER — VANCOMYCIN HCL IN DEXTROSE 1-5 GM/200ML-% IV SOLN
INTRAVENOUS | Status: AC
Start: 2016-07-12 — End: 2016-07-12
  Filled 2016-07-12: qty 200

## 2016-07-12 MED ORDER — HEPARIN SODIUM (PORCINE) 1000 UNIT/ML DIALYSIS: 1000.0000 [IU] | INTRAMUSCULAR | Status: DC | PRN

## 2016-07-12 MED ORDER — LIDOCAINE HCL (PF) 1 % IJ SOLN: 5.0000 mL | INTRAMUSCULAR | Status: DC | PRN

## 2016-07-12 NOTE — Progress Notes (Signed)
Occupational Therapy Treatment and Discharge Patient Details Name: [name] MRN: [id number] DOB: November 23, 1975 Today's Date: 07/12/2016    History of present illness Patient is a 41 yo female admitted 05/25/2016 with [facility].  Patient with Rt infarcts, occluded MCA, Rt ICA, and Lt vertebral artery. No verbalizations or voluntary movement.   PMH:  CVA with RLE weak, DM, obesity, chronic lymphedema, wound Rt calf, VAC on Lt thigh, HTN, CHF, depression, ESRD on HD, asthma   OT comments  Pt not progressing toward OT goals and goals downgraded in plan of care section. Session focused on cleaning of RIGHT hand and palm guard (previous note said Left hand, but guard was on R hand when OT entered room). Pt with little to no acknowledgement of OT presence. Palm guard remains appropriate with no signs of skin break down. RN aware of cleaning precautions and appropriate care. OT to sign off at this time. Please feel free to re-order OT should medical condition improve. Thank you.    Follow Up Recommendations  Supervision/Assistance - 24 hour;LTACH    Equipment Recommendations  Other (comment) (defer to next venue of care)    Recommendations for Other Services      Precautions / Restrictions Precautions Precautions: Fall Restrictions Weight Bearing Restrictions: No       Mobility Bed Mobility               General bed mobility comments: not assessed this session  Transfers                 General transfer comment: not attempted this session    Balance                                           ADL either performed or assessed with clinical judgement   ADL Overall ADL's : Needs assistance/impaired                                       General ADL Comments: Remains total Assist     Vision       Perception     Praxis      Cognition Arousal/Alertness: Lethargic Behavior During Therapy: Flat  affect Overall Cognitive Status: Difficult to assess                                          Exercises     Shoulder Instructions       General Comments Session focused on checking R palm guard and hand cleaning/maintenance.     Pertinent Vitals/ Pain       Pain Assessment: Faces Faces Pain Scale: Hurts a little bit Pain Location: slight withdrawel with R hand manipulation/cleaning while checking palm protector Pain Descriptors / Indicators: Grimacing Pain Intervention(s): Monitored during session  Home Living                                          Prior Functioning/Environment              Frequency  Min 2X/week  Progress Toward Goals  OT Goals(current goals can now be found in the care plan section)  Progress towards OT goals: Not progressing toward goals - comment (Discontinue therapy as Pt no longer medically appropriate)  Acute Rehab OT Goals Patient Stated Goal: none stated OT Goal Formulation: With patient Time For Goal Achievement: 07/24/16 Potential to Achieve Goals: Poor  Plan Other (comment) (dc OT at this time, as Pt not medically appopropriate)    Co-evaluation                 AM-PAC PT "6 Clicks" Daily Activity     Outcome Measure   Help from another person eating meals?: Total Help from another person taking care of personal grooming?: Total Help from another person toileting, which includes using toliet, bedpan, or urinal?: Total Help from another person bathing (including washing, rinsing, drying)?: Total Help from another person to put on and taking off regular upper body clothing?: Total Help from another person to put on and taking off regular lower body clothing?: Total 6 Click Score: 6    End of Session    OT Visit Diagnosis: Muscle weakness (generalized) (M62.81);Low vision, both eyes (H54.2);Other symptoms and signs involving the nervous system (R29.898);Other symptoms and signs  involving cognitive function;Cognitive communication deficit (R41.841);Hemiplegia and hemiparesis Symptoms and signs involving cognitive functions: Cerebral infarction Hemiplegia - caused by: Cerebral infarction   Activity Tolerance Patient tolerated treatment well   Patient Left in bed;with call bell/phone within reach   Nurse Communication Mobility status;Other (comment) (palm guard appropriate, no skin breakdown noticed)        Time: [id number] OT Time Calculation (min): 9 min  Charges: OT General Charges $OT Visit: 1 Procedure OT Treatments $Self Care/Home Management : 8-22 mins  Sherryl MangesLaura Abigale Dorow OTR/L [phone]  Evern BioLaura J Barbera Perritt 07/12/2016, 4:17 PM

## 2016-07-12 NOTE — Addendum Note (Signed)
Addendum  created 07/12/16 1333 by Krystian Ferrentino, MD   Sign clinical note    

## 2016-07-12 NOTE — Progress Notes (Signed)
Pharmacy Antibiotic Note  [name] is a 41 y.o. female admitted on 06/06/2016 with L thigh soft tissue wound - s/p I&d x 2. Pt now growing GPC in clusters in 2/2 blood cx. BCID run but invalid.  Pharmacy has been consulted for vancomycin dosing. Pt also on Zosyn.  Pt with ESRD - HD (MWF)  Random vancomycin level post-HD today is 37 (goal 15-25 mcg/ml).  Plan: No additional vancomycin needed at this time Will f/u HD schedule and culture data   Height: 5\' 2"  (157.5 cm) Weight: (P)  (bedscale broke.) IBW/kg (Calculated) : 50.1  Temp (24hrs), Avg:100.1 F (37.8 C), Min:98.8 F (37.1 C), Max:100.9 F (38.3 C)   Recent Labs Lab 07/06/16 0234 07/08/16 0620 07/08/16 0811 07/10/16 0344 07/12/16 0553 07/12/16 0845 07/12/16 1248 07/12/16 1539  WBC 18.9*  --  17.6* 18.8* 23.6*  --   --   --   CREATININE 1.58* 3.11*  --  2.62*  --  2.44*  --   --   VANCORANDOM  --   --   --   --   --   --  38 37    Estimated Creatinine Clearance: 36.8 mL/min (A) (by C-G formula based on SCr of 2.44 mg/dL (H)).    Allergies  Allergen Reactions  . Azithromycin Other (See Comments)    Nose bleeding event     Thank you for allowing pharmacy to be a part of this patient's care.  Loura BackJennifer Carbondale, PharmD, BCPS Clinical Pharmacist Phone for tonight [phone] Main pharmacy - [phone] 07/12/2016 4:46 PM

## 2016-07-12 NOTE — Consult Note (Signed)
WOC Nurse wound follow up Weekly wound assessment, updated Dr. 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 Left posterior calf:  100% yellow/black loose necrotic skin with tendon exposure; green/yellow purulent drainage from the amount of necrotic tissue Right distal calf; healed;100% pink re-epithelialized Right medial distal thigh: healed 100% re-epithelialized Sacrum;buttock several partial thickness areas of skin loss; MASD (moisture associated skin damage; each aprox 2cm x 2cm x 0.1cm; minimal serosanguinous drainage  Left heel: unstagable pressure injury: 4cm x 5.5cm x 0cm: intact, no drainage. Right medial proximal thigh; NEW; 1.0cm x 5.0cm x 0cm; 100% necrotic, eschar, minimal  Right anterior thigh; NEW; dark purple tissue, intact but most likely will evolve like traditional calciphylaxis wound; hardened tissue; 1cm x 8cm x 0cm  Left medial thigh: NEW; 10cm x 7cm x 0; 100% soft black necrotic tissue Measurement: see above  Wound bed:see above Drainage (amount, consistency, odor) see above  Periwound: edema  Dressing procedure/placement/frequency: Added enzymatic debridement to the new wounds that are necrotic, unfortunately these wound will not improve but worsen if they follow the course of traditional calciphylaxis wounds.   She now has exposed tendon in the left posterior calf wound, which would need orthopedic evaluation if desired for recommendations She has multiple hardened areas over her pannus that in my experience are the early stages of additional wounds.  I have discussed the above with Dr. Thedore MinsSingh.  Poor prognosis from wound care/healing perspective.  Will need placement in facility that can provide  complex wound care assessment and care.  WOC Nurse team will follow along with you for weekly wound assessments.  Please notify me of any acute changes in the wounds or any new areas of concerns [name] MSN, RN,CWOCN, CNS [phone]

## 2016-07-12 NOTE — Progress Notes (Signed)
Subjective: Interval History: had fevers on Vanco.  Objective: Vital signs in last 24 hours: Temp:  [99.6 F (37.6 C)-102 F (38.9 C)] 99.6 F (37.6 C) (06/01 0437) Pulse Rate:  [91-118] 111 (06/01 0437) Resp:  [20-23] 20 (06/01 0437) BP: (100-143)/(46-83) 136/46 (06/01 0437) SpO2:  [97 %-100 %] 100 % (06/01 0437) Weight change:   Intake/Output from previous day: No intake/output data recorded. Intake/Output this shift: No intake/output data recorded.  General appearance: morbidly obese and eys open, only moans.  Resp: diminished breath sounds bilaterally Cardio: S1, S2 normal and systolic murmur: systolic ejection 2/6, decrescendo at 2nd left intercostal space GI: obese, pos bs,  paniculus hard, irreg, dyscolored, ? painful. Extremities: LUA AVG, wounds both thighs.  Lab Results:  Recent Labs  07/10/16 0344 07/12/16 0553  WBC 18.8* 23.6*  HGB 8.3* 7.4*  HCT 27.8* 23.5*  PLT 492* 501*   BMET:  Recent Labs  07/10/16 0344  NA 133*  K 5.2*  CL 95*  CO2 26  GLUCOSE 180*  BUN 67*  CREATININE 2.62*  CALCIUM 8.8*   No results for input(s): PTH in the last 72 hours. Iron Studies: No results for input(s): IRON, TIBC, TRANSFERRIN, FERRITIN in the last 72 hours.  Studies/Results: Dg Chest Port 1 View  Result Date: 07/11/2016 CLINICAL DATA:  Shortness of breath EXAM: PORTABLE CHEST 1 VIEW COMPARISON:  Portable chest x-ray of Jun 25, 2016 FINDINGS: The right hemidiaphragm remains elevated with resultant right lung hypoinflation. The left lung is better inflated. The retrocardiac lung markings are more conspicuous today. The cardiac silhouette is enlarged. The central pulmonary vascularity is mildly prominent though not greatly changed. The mediastinum is normal in width. There is no pleural effusion. IMPRESSION: Increased retrocardiac density on the left compatible with atelectasis or early pneumonia. There is probable low-grade CHF as well. Followup PA and lateral chest  X-ray is recommended in 3-4 weeks following trial of antibiotic therapy to ensure resolution and exclude underlying malignancy. Electronically Signed   By: David  SwazilandJordan M.D.   On: 07/11/2016 07:43   Dg Abd Portable 1v  Result Date: 07/11/2016 CLINICAL DATA:  Diarrhea EXAM: PORTABLE ABDOMEN - 1 VIEW COMPARISON:  Jun 13, 2016 and Jun 20, 2016 FINDINGS: Gastrostomy catheter noted in left upper abdomen. There is no bowel dilatation or air-fluid level to suggest bowel obstruction. No evident free air. Visualized lung bases are clear. There are phleboliths pelvis as well as foci of iliac artery calcification. IMPRESSION: Gastrostomy catheter in the upper abdomen. No bowel obstruction or free air. Iliac artery atherosclerosis noted. Electronically Signed   By: Bretta BangWilliam  Woodruff III M.D.   On: 07/11/2016 10:56    I have reviewed the patient's current medications.  Assessment/Plan: 1 ESRD HD, need to do more effic 2 CVA no improvement NOT LONG TERM DIALYSIS CANDIDATE 3 Massive obesity 4 DM 5 Fever per primary 6 Anemia esa, Fe ok 7 HPTH 8 Panniculitis  ?? Related to fever, ? Calciphylaxis P HD, AB, counsel    LOS: 34 days   Tharun Cappella L 07/12/2016,8:37 AM

## 2016-07-12 NOTE — Progress Notes (Signed)
MEDICATION RELATED CONSULT NOTE - INITIAL   Pharmacy Consult for Sodium thiosulfate Indication:  for developing calciphylaxis   Allergies  Allergen Reactions  . Azithromycin Other (See Comments)    Nose bleeding event    Patient Measurements: Height: 5\' 2"  (157.5 cm) Weight: (P)  (bedscale broke.) IBW/kg (Calculated) : 50.1 Weight = 113kg on 07/02/16  Vital Signs: Temp: (P) 98.8 F (37.1 C) (06/01 1215) Temp Source: (P) Oral (06/01 1215) BP: (P) 113/61 (06/01 1215) Pulse Rate: (P) 111 (06/01 1215) Intake/Output from previous day: No intake/output data recorded. Intake/Output from this shift: Total I/O In: -  Out: 3500 [Other:3500]  Labs:  Recent Labs  07/10/16 0344 07/12/16 0553 07/12/16 0845  WBC 18.8* 23.6*  --   HGB 8.3* 7.4*  --   HCT 27.8* 23.5*  --   PLT 492* 501*  --   CREATININE 2.62*  --  2.44*  PHOS 3.0  --  2.5  ALBUMIN 1.0*  --  <1.0*   Estimated Creatinine Clearance: 36.8 mL/min (A) (by C-G formula based on SCr of 2.44 mg/dL (H)).   Microbiology: Recent Results (from the past 720 hour(s))  MRSA PCR Screening     Status: None   Collection Time: 06/25/16  2:26 AM  Result Value Ref Range Status   MRSA by PCR NEGATIVE NEGATIVE Final    Comment:        The GeneXpert MRSA Assay (FDA approved for NASAL specimens only), is one component of a comprehensive MRSA colonization surveillance program. It is not intended to diagnose MRSA infection nor to guide or monitor treatment for MRSA infections.   Culture, blood (routine x 2)     Status: None   Collection Time: 06/25/16  7:02 PM  Result Value Ref Range Status   Specimen Description BLOOD RIGHT HAND  Final   Special Requests IN PEDIATRIC BOTTLE Blood Culture adequate volume  Final   Culture NO GROWTH 5 DAYS  Final   Report Status 06/30/2016 FINAL  Final  Culture, blood (routine x 2)     Status: None   Collection Time: 06/25/16  7:40 PM  Result Value Ref Range Status   Specimen Description  BLOOD RIGHT HAND  Final   Special Requests IN PEDIATRIC BOTTLE Blood Culture adequate volume  Final   Culture NO GROWTH 5 DAYS  Final   Report Status 06/30/2016 FINAL  Final  Culture, blood (routine x 2)     Status: None   Collection Time: 06/27/16  1:11 PM  Result Value Ref Range Status   Specimen Description BLOOD RIGHT HAND  Final   Special Requests   Final    BOTTLES DRAWN AEROBIC ONLY Blood Culture results may not be optimal due to an inadequate volume of blood received in culture bottles   Culture NO GROWTH 5 DAYS  Final   Report Status 07/02/2016 FINAL  Final  Culture, blood (routine x 2)     Status: None   Collection Time: 06/27/16  2:28 PM  Result Value Ref Range Status   Specimen Description BLOOD RIGHT WRIST  Final   Special Requests IN PEDIATRIC BOTTLE Blood Culture adequate volume  Final   Culture NO GROWTH 5 DAYS  Final   Report Status 07/02/2016 FINAL  Final  Culture, blood (Routine X 2) w Reflex to ID Panel     Status: None   Collection Time:  12:36 PM  Result Value Ref Range Status   Specimen Description BLOOD RIGHT HAND  Final   Special  Requests IN PEDIATRIC BOTTLE Blood Culture adequate volume  Final   Culture NO GROWTH 5 DAYS  Final   Report Status 07/10/2016 FINAL  Final  Culture, blood (Routine X 2) w Reflex to ID Panel     Status: None   Collection Time:  12:36 PM  Result Value Ref Range Status   Specimen Description BLOOD RIGHT HAND  Final   Special Requests IN PEDIATRIC BOTTLE Blood Culture adequate volume  Final   Culture NO GROWTH 5 DAYS  Final   Report Status 07/09/2016 FINAL  Final  Culture, blood (routine x 2)     Status: None (Preliminary result)   Collection Time: 07/11/16  8:13 AM  Result Value Ref Range Status   Specimen Description BLOOD LEFT ARM  Final   Special Requests   Final    IN PEDIATRIC BOTTLE Blood Culture results may not be optimal due to an excessive volume of blood received in culture bottles   Culture  Setup Time    Final    IN PEDIATRIC BOTTLE GRAM POSITIVE COCCI IN CLUSTERS CRITICAL RESULT CALLED TO, READ BACK BY AND VERIFIED WITH: PHARMD K AMEND [id number] MLM    Culture GRAM POSITIVE COCCI  Final   Report Status PENDING  Incomplete  Culture, blood (routine x 2)     Status: None (Preliminary result)   Collection Time: 07/11/16  8:15 AM  Result Value Ref Range Status   Specimen Description BLOOD RIGHT HAND  Final   Special Requests IN PEDIATRIC BOTTLE Blood Culture adequate volume  Final   Culture  Setup Time   Final    IN PEDIATRIC BOTTLE GRAM POSITIVE COCCI IN CLUSTERS CRITICAL RESULT CALLED TO, READ BACK BY AND VERIFIED WITH: PHARMD K AMEND [id number] MLM    Culture GRAM POSITIVE COCCI  Final   Report Status PENDING  Incomplete  C difficile quick scan w PCR reflex     Status: None   Collection Time: 07/11/16  7:39 PM  Result Value Ref Range Status   C Diff antigen NEGATIVE NEGATIVE Final   C Diff toxin NEGATIVE NEGATIVE Final   C Diff interpretation No C. difficile detected.  Final    Medical History: Past Medical History:  Diagnosis Date  . Anasarca 02/05/2015  . Asthma   . CHF (congestive heart failure) (HCC)    diastolic  . CVA (cerebral infarction) 1990's   "writing is not the same since" R leg weakness   . GERD (gastroesophageal reflux disease)   . Hypertension   . Morbid obesity (HCC)   . Palliative care encounter   . Stroke (HCC)   . Type II diabetes mellitus (HCC)     Assessment: 41 y.o female with h/o ESRD, on HD qMWF.  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  Pharmacy consulted to dose Sodium thiosulfate for calciphylaxis.  Goal of Therapy:  Complete resolution of calciphylaxis symptoms.  Plan:  Sodium thiosulfate 25 gm IV qMWF after HD.  Follow up for resolution of calciphylaxis symptoms.  Noah Delaine, RPh Clinical Pharmacist Pager: [phone] Main Pharmacy [phone]   07/12/2016,2:13 PM

## 2016-07-12 NOTE — Addendum Note (Signed)
Addendum  created 07/12/16 1332 by Hollis, Kevin D, MD   Sign clinical note    

## 2016-07-12 NOTE — Progress Notes (Addendum)
@IPLOG         PROGRESS NOTE                                                                                                                                                                                                             Patient Demographics:    [name], is a 41 y.o. female, DOB - [birth year], ZOX:[id number]  Admit date - 07-05-2016   Admitting Physician Michael Litter, MD  Outpatient Primary MD for the patient is Marletta Lor, NP  LOS - 34  Chief Complaint  Patient presents with  . Altered Mental Status       Brief Narrative  [name] is a 41 y.o. female with a history of CVA, ESRD, infected leg ulcer, DM type 2, chronic lymphedema, HTN, chronic diastolic heart failure, depression. She presented with acute altered mental status and found to have new CVAs. She is now nonverbal, unable to move her extremities to verbal commands, currently only tracks with eyes at times. HD is being continued, she is partial code and we are awaiting placement. She has been seen by neurology, nephrology and palliative care this admission. She was transferred to my care on 07/10/2016 on day 32 of her hospital stay.   Subjective:    [name]  today in dialysis unit in the bed, appears to be comfortable but unable to answer questions or follow commands   Assessment  & Plan :     1.Obtundation. Due to multiple CVAs, current radiological evidence of Acute and subacute infarcts in the right thalamus, posterior limb internal capsule, and periatrial white matter, progressed from 2016-07-05, advanced remote ischemic injury - at this time prognosis appears to be poor, she remains aphasic, has dysphagia requiring PEG tube, besides eye movement to verbal cues she is unable to follow commands, reportedly she squeezed her daughter's hand a few days ago. However does not seem to have meaningful quality of life at this time. His been seen by neurology along with  palliative care, family wants to continue present line of medical care. She is currently on aspirin and statin via PEG tube. We await placement and further decision by family members. Husband at this time is not showing any interest in her care. Overall prognosis extremely poor. We'll continue to provide supportive care and monitor closely.  2. 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 Kindly see wound care notes for details, she has had infection in the wounds prior to this admission as well, has been seen by general  surgery team this admission and they are not planning for any surgical debridement. She was treated with IV antibiotics, was on on Zosyn since 07/05/2016 and vancomycin was added 07/12/2016 for repeat fevers. Will try sodium thiosulfate.  3. ESRD. Nephrology on board currently on M,W, F dialysis schedule.  4. Fevers starting 07/10/2016 night. On IV Zosyn for #2 above, chest x-ray suggestive of possible re-aspiration, she also has multiple skin wounds, blood cultures from 07/11/2016 growing gram-positive cocci, so vancomycin was added on 07/12/2016 she was already on Zosyn since 07/05/2016 for her lower extremity wounds. Ruled out for C. difficile on 07/12/2016. Also Note earlier in the admission around 06/18/2016 there was a suspicion of vegetation and endocarditis this was ruled out by a TEE which did not show any growth. She has also been seen by ID earlier during this admission. Overall prognosis extremely poor. We'll continue to provide supportive care and monitor closely.  5. Aphasia and dysphagia due to #1 above. Currently nothing by mouth with all feeding and medications via PEG tube.  6. Hypertension. Table on beta blocker via PEG tube.  7. Diastolic CHF. EF 55-60%. Currently compensated from a heart standpoint, continue on beta blocker, excess fluid removal via dialysis.  8. Dyslipidemia. Again used to be on statin via PEG tube.   9. Positive RPR.  T.Palladium antibodies were negative.  10. DM II. Currently on cognition of Lantus and sliding scale.  Lab Results  Component Value Date   HGBA1C 6.7 (H) 06/09/2016   CBG (last 3)   Recent Labs  07/11/16 1954 07/11/16 2350 07/12/16 0349  GLUCAP 259* 273* 213*     Diet : Diet NPO time specified    Family Communication  :  None  Code Status :  Okay for CPR and medications, no intubation or shocks  Disposition Plan  :  To be decided  Consults  :  Nephrology, neurology, palliative care, ID, Cards  Procedures  :  HD Monday Wednesday Friday  DVT Prophylaxis  :   Heparin    Lab Results  Component Value Date   PLT 501 (H) 07/12/2016    Inpatient Medications  Scheduled Meds: . acetaminophen  650 mg Oral TID  . aspirin  325 mg Per Tube Daily  . atorvastatin  20 mg Per Tube QPM  . buPROPion  100 mg Per Tube BID  . chlorhexidine  15 mL Mouth Rinse BID  . collagenase   Topical Daily  . darbepoetin (ARANESP) injection - DIALYSIS  200 mcg Intravenous Q Wed-HD  . feeding supplement (PRO-STAT SUGAR FREE 64)  30 mL Per Tube QID  . FLUoxetine  40 mg Per Tube Daily  . heparin subcutaneous  5,000 Units Subcutaneous Q8H  . HYDROmorphone  2 mg Per Tube TID  . insulin aspart  0-20 Units Subcutaneous Q4H  . insulin glargine  35 Units Subcutaneous BID  . magic mouthwash  10 mL Oral TID  . metoprolol tartrate  25 mg Per Tube BID  . modafinil  200 mg Oral Daily  . multivitamin  1 tablet Oral QHS  . Zinc Oxide   Topical BID   Continuous Infusions: . feeding supplement (OSMOLITE 1.5 CAL) 1,000 mL (07/10/16 1730)  . piperacillin-tazobactam (ZOSYN)  IV Stopped (07/12/16 0659)  . sodium chloride    . vancomycin     PRN Meds:.[DISCONTINUED] acetaminophen **OR** [DISCONTINUED] acetaminophen (TYLENOL) oral liquid 160 mg/5 mL **OR** acetaminophen, Gerhardt's butt cream, glucagon (human recombinant), hydrALAZINE, HYDROmorphone (DILAUDID) injection, metoprolol tartrate, ondansetron  (  ZOFRAN) IV  Antibiotics  :    Anti-infectives    Start     Dose/Rate Route Frequency Ordered Stop   07/12/16 1200  vancomycin (VANCOCIN) IVPB 1000 mg/200 mL premix     1,000 mg 200 mL/hr over 60 Minutes Intravenous Every M-W-F (Hemodialysis) 07/12/16 0616     07/12/16 0630  vancomycin (VANCOCIN) 2,000 mg in sodium chloride 0.9 % 500 mL IVPB     2,000 mg 250 mL/hr over 120 Minutes Intravenous STAT 07/12/16 0616 07/12/16 0842   07/06/16 0400  piperacillin-tazobactam (ZOSYN) IVPB 3.375 g     3.375 g 12.5 mL/hr over 240 Minutes Intravenous Every 12 hours 07/05/16 1504     07/05/16 1600  piperacillin-tazobactam (ZOSYN) IVPB 3.375 g     3.375 g 100 mL/hr over 30 Minutes Intravenous  Once 07/05/16 1504 07/05/16 1848   06/28/16 0550  piperacillin-tazobactam (ZOSYN) IVPB 3.375 g  Status:  Discontinued     3.375 g 12.5 mL/hr over 240 Minutes Intravenous Every 12 hours 06/27/16 1844 06/29/16 1216   06/21/16 1200  vancomycin (VANCOCIN) IVPB 1000 mg/200 mL premix  Status:  Discontinued     1,000 mg 200 mL/hr over 60 Minutes Intravenous Every M-W-F (Hemodialysis) 06/19/16 1009 06/29/16 1216   06/20/16 0600  ceFAZolin (ANCEF) IVPB 2g/100 mL premix  Status:  Discontinued     2 g 200 mL/hr over 30 Minutes Intravenous To Radiology 06/19/16 1427 06/21/16 0600   06/19/16 1200  vancomycin (VANCOCIN) IVPB 750 mg/150 ml premix     750 mg 150 mL/hr over 60 Minutes Intravenous Every M-W-F (Hemodialysis) 06/19/16 1009 06/19/16 1301   06/19/16 1158  Vancomycin (VANCOCIN) 750-5 MG/150ML-% IVPB    Comments:  Herriott, [name]   : cabinet override      06/19/16 1158 06/19/16 1215   07/01/2016 1300  piperacillin-tazobactam (ZOSYN) IVPB 3.375 g  Status:  Discontinued     3.375 g 12.5 mL/hr over 240 Minutes Intravenous Every 12 hours 07/07/2016 1231 06/27/16 1844   06/14/16 0931  vancomycin (VANCOCIN) 1-5 GM/200ML-% IVPB    Comments:  Matilde HaymakerSerrano, [name]   : cabinet override      06/14/16 0931 06/14/16 1113    06/12/16 1700  ceFEPIme (MAXIPIME) 1 g in dextrose 5 % 50 mL IVPB  Status:  Discontinued     1 g 100 mL/hr over 30 Minutes Intravenous Every 24 hours 06/12/16 1648 06/23/2016 1205   06/12/16 0959  vancomycin (VANCOCIN) 1-5 GM/200ML-% IVPB    Comments:  [name], [name]   : cabinet override      06/12/16 0959 06/12/16 1015   06/10/16 1200  vancomycin (VANCOCIN) IVPB 1000 mg/200 mL premix  Status:  Discontinued     1,000 mg 200 mL/hr over 60 Minutes Intravenous Every M-W-F (Hemodialysis) 06/10/16 0851 06/19/16 1009   06/10/16 1000  piperacillin-tazobactam (ZOSYN) IVPB 3.375 g  Status:  Discontinued     3.375 g 12.5 mL/hr over 240 Minutes Intravenous Every 12 hours 06/10/16 0851 06/12/16 1629   06/10/16 1000  vancomycin (VANCOCIN) 2,250 mg in sodium chloride 0.9 % 500 mL IVPB  Status:  Discontinued     2,250 mg 250 mL/hr over 120 Minutes Intravenous Every 12 hours 06/10/16 0851 06/10/16 0858   06/10/16 1000  vancomycin (VANCOCIN) 2,250 mg in sodium chloride 0.9 % 500 mL IVPB     2,250 mg 250 mL/hr over 120 Minutes Intravenous  Once 06/10/16 0858 06/10/16 1640         Objective:   Vitals:  07/12/16 0815 07/12/16 0820 07/12/16 0900 07/12/16 0908  BP: 121/71 139/73 125/68 117/66  Pulse: (!) 115 (!) 118 (!) 117 (!) 133  Resp: 18 (!) 22 (!) 21 (!) 25  Temp:      TempSrc:      SpO2: 98% 98% 96%   Height:        Wt Readings from Last 3 Encounters:  06/30/16 103.9 kg (229 lb)  05/31/16 135 kg (297 lb 9.9 oz)  03/29/16 (!) 167 kg (368 lb 3.2 oz)    No intake or output data in the 24 hours ending 07/12/16 1610   Physical Exam  Awake But can only move eyes to verbal commands, no meaningful purposeful movement otherwise [place].AT,PERRAL Supple Neck,No JVD, No cervical lymphadenopathy appriciated.  Symmetrical Chest wall movement, Good air movement bilaterally, CTAB RRR,No Gallops,Rubs or new Murmurs, No Parasternal Heave +ve B.Sounds, Abd Soft, No tenderness, No organomegaly appriciated,  No rebound - guarding or rigidity. No Cyanosis, Clubbing or edema, No new Rash or bruise PEG in place, bilat leg splints, rectal tube in place with liquid stool, multiple lower extremity ulcers under dressing kindly see wound care nursing notes for details.   Data Review:    CBC  Recent Labs Lab 07/06/16 0234 07/08/16 0811 07/10/16 0344 07/12/16 0553  WBC 18.9* 17.6* 18.8* 23.6*  HGB 9.3* 8.0* 8.3* 7.4*  HCT 29.4* 26.5* 27.8* 23.5*  PLT 491* 505* 492* 501*  MCV 94.2 95.3 95.5 95.9  MCH 29.8 28.8 28.5 30.2  MCHC 31.6 30.2 29.9* 31.5  RDW 18.5* 17.5* 17.3* 17.3*  LYMPHSABS 3.1  --  3.1  --   MONOABS 1.2*  --  1.4*  --   EOSABS 0.4  --  0.4  --   BASOSABS 0.0  --  0.0  --     Chemistries   Recent Labs Lab 07/06/16 0234 07/08/16 0620 07/10/16 0344  NA 134* 135 133*  K 4.2 4.7 5.2*  CL 97* 96* 95*  CO2 27 26 26   GLUCOSE 206* 149* 180*  BUN 37* 92* 67*  CREATININE 1.58* 3.11* 2.62*  CALCIUM 8.5* 8.7* 8.8*  AST 25  --   --   ALT 21  --   --   ALKPHOS 243*  --   --   BILITOT 0.5  --   --    ------------------------------------------------------------------------------------------------------------------ No results for input(s): CHOL, HDL, LDLCALC, TRIG, CHOLHDL, LDLDIRECT in the last 72 hours.  Lab Results  Component Value Date   HGBA1C 6.7 (H) 06/09/2016   ------------------------------------------------------------------------------------------------------------------ No results for input(s): TSH, T4TOTAL, T3FREE, THYROIDAB in the last 72 hours.  Invalid input(s): FREET3 ------------------------------------------------------------------------------------------------------------------ No results for input(s): VITAMINB12, FOLATE, FERRITIN, TIBC, IRON, RETICCTPCT in the last 72 hours.  Coagulation profile No results for input(s): INR, PROTIME in the last 168 hours.  No results for input(s): DDIMER in the last 72 hours.  Cardiac Enzymes No results for  input(s): CKMB, TROPONINI, MYOGLOBIN in the last 168 hours.  Invalid input(s): CK ------------------------------------------------------------------------------------------------------------------    Component Value Date/Time   BNP 207.5 (H) 05/01/2016 1218    Micro Results Recent Results (from the past 240 hour(s))  Culture, blood (Routine X 2) w Reflex to ID Panel     Status: None   Collection Time:  12:36 PM  Result Value Ref Range Status   Specimen Description BLOOD RIGHT HAND  Final   Special Requests IN PEDIATRIC BOTTLE Blood Culture adequate volume  Final   Culture NO GROWTH 5 DAYS  Final  Report Status 07/10/2016 FINAL  Final  Culture, blood (Routine X 2) w Reflex to ID Panel     Status: None   Collection Time:  12:36 PM  Result Value Ref Range Status   Specimen Description BLOOD RIGHT HAND  Final   Special Requests IN PEDIATRIC BOTTLE Blood Culture adequate volume  Final   Culture NO GROWTH 5 DAYS  Final   Report Status 07/09/2016 FINAL  Final  Culture, blood (routine x 2)     Status: None (Preliminary result)   Collection Time: 07/11/16  8:13 AM  Result Value Ref Range Status   Specimen Description BLOOD LEFT ARM  Final   Special Requests   Final    IN PEDIATRIC BOTTLE Blood Culture results may not be optimal due to an excessive volume of blood received in culture bottles   Culture  Setup Time   Final    IN PEDIATRIC BOTTLE GRAM POSITIVE COCCI IN CLUSTERS CRITICAL RESULT CALLED TO, READ BACK BY AND VERIFIED WITH: PHARMD K AMEND [id number] MLM    Culture GRAM POSITIVE COCCI  Final   Report Status PENDING  Incomplete  Culture, blood (routine x 2)     Status: None (Preliminary result)   Collection Time: 07/11/16  8:15 AM  Result Value Ref Range Status   Specimen Description BLOOD RIGHT HAND  Final   Special Requests IN PEDIATRIC BOTTLE Blood Culture adequate volume  Final   Culture  Setup Time   Final    IN PEDIATRIC BOTTLE GRAM POSITIVE COCCI IN  CLUSTERS CRITICAL RESULT CALLED TO, READ BACK BY AND VERIFIED WITH: PHARMD K AMEND [id number] MLM    Culture GRAM POSITIVE COCCI  Final   Report Status PENDING  Incomplete  C difficile quick scan w PCR reflex     Status: None   Collection Time: 07/11/16  7:39 PM  Result Value Ref Range Status   C Diff antigen NEGATIVE NEGATIVE Final   C Diff toxin NEGATIVE NEGATIVE Final   C Diff interpretation No C. difficile detected.  Final    Radiology Reports Ct Head Wo Contrast  Result Date: 06/18/2016 CLINICAL DATA:  Altered mental status. The patient began spontaneously nonverbal during dialysis 05/12/2016. Acute infarcts on brain MRI 05/30/2016. EXAM: CT HEAD WITHOUT CONTRAST TECHNIQUE: Contiguous axial images were obtained from the base of the skull through the vertex without intravenous contrast. COMPARISON:  Brain MRI and head CT scan 05/17/2016. FINDINGS: Brain: The brain is atrophic with extensive chronic microvascular ischemic change and multiple remote infarcts bilaterally. Large infarct is again seen in the right cerebellum. No evidence of acute infarct, hemorrhage, mass lesion, mass effect, midline shift or abnormal extra-axial fluid collection. No hydrocephalus or pneumocephalus. Vascular: Atherosclerosis noted. Skull: Intact. Sinuses/Orbits: Negative. Other: None. IMPRESSION: No acute abnormality. Age advanced atrophy and multiple bilateral infarcts as seen on prior exams. Atherosclerosis. Electronically Signed   By: Drusilla Kanner M.D.   On: 06/18/2016 10:24   Ir Gastrostomy Tube Mod Sed  Result Date: 06/20/2016 CLINICAL DATA:  Previous stroke, dysphagia, needs enteral feeding support EXAM: PERC PLACEMENT GASTROSTOMY FLUOROSCOPY TIME:  2.5 minutes, 190 uGym2 DAP TECHNIQUE: The procedure, risks, benefits, and alternatives were explained to the patient and family. Questions regarding the procedure were encouraged and answered. The family understands and consents to the procedure. The patient  was already receiving adequate prophylactic IV antibiotic coverage. Safe percutaneous approach was confirmed on a recent CT scan. A 5 French angiographic catheter was placed as orogastric tube. The  upper abdomen was prepped with Betadine, draped in usual sterile fashion, and infiltrated locally with 1% lidocaine. Intravenous Fentanyl and Versed were administered as conscious sedation during continuous monitoring of the patient's level of consciousness and physiological / cardiorespiratory status by the radiology RN, with a total moderate sedation time of 22 minutes. Stomach was insufflated using air through the orogastric tube. An 63 French sheath needle was advanced percutaneously into the gastric lumen under fluoroscopy. Gas could be aspirated and a small contrast injection confirmed intraluminal spread. The sheath was exchanged over a guidewire for a 9 [place] vascular sheath, through which the snare device was advanced and used to snare a guidewire passed through the orogastric tube. This was withdrawn, and the snare attached to the 20 French pull-through gastrostomy tube, which was advanced antegrade, positioned with the internal bumper securing the anterior gastric wall to the anterior abdominal wall. Small contrast injection confirms appropriate positioning. The external bumper was applied and the catheter was flushed. COMPLICATIONS: COMPLICATIONS none IMPRESSION: 1. Technically successful 20 French pull-through gastrostomy placement under fluoroscopy. Electronically Signed   By: Corlis Leak M.D.   On: 06/20/2016 16:44   Ct Femur Left Wo Contrast  Result Date: 06/27/2016 CLINICAL DATA:  Encephalopathy.  Left leg wound EXAM: CT OF THE LOWER LEFT EXTREMITY WITHOUT CONTRAST TECHNIQUE: Multidetector CT imaging of the lower left extremity was performed according to the standard protocol. COMPARISON:  06/09/2016 FINDINGS: Bones/Joint/Cartilage No acute fracture or dislocation. Normal alignment. No joint effusion.  No periosteal reaction or bone destruction. Tricompartmental mild -moderate osteoarthritis of the left knee with joint space narrowing and marginal osteophytosis. Ligaments Ligaments are suboptimally evaluated by CT. Muscles and Tendons Muscles are normal. Soft tissue Soft tissue wound along the inferior medial left thigh with a wound vac present. Soft tissue inflammation in the adjacent subcutaneous fat of the thigh. Soft tissue abnormality along the upper medial left thigh unchanged compared with the prior examination likely reflecting an area of scarring. No focal fluid collection to suggest an abscess. No soft tissue mass. Peripheral vascular atherosclerotic disease. IMPRESSION: 1. Soft tissue wound along the inferior medial left thigh with a wound vac present. Surrounding cellulitis. No drainable fluid collection to suggest an abscess. 2. Soft tissue abnormality along the upper medial left thigh unchanged compared with the prior examination likely reflecting an area of scarring. Electronically Signed   By: Elige Ko   On: 06/27/2016 14:56   Ir Removal Tun Cv Cath W/o Fl  Result Date: 07-03-2016 INDICATION: History of end-stage renal disease, now with functional dialysis access. Dialysis catheter was initially placed by Dr. Lowella Dandy on 05/07/2016 and has functioned well throughout duration of usage. EXAM: REMOVAL OF TUNNELED HEMODIALYSIS CATHETER MEDICATIONS: None COMPLICATIONS: None immediate. PROCEDURE: Informed written consent was obtained from the patient's family following an explanation of the procedure, risks, benefits and alternatives to treatment. A time out was performed prior to the initiation of the procedure. Maximal barrier sterile technique was utilized including caps, mask, sterile gowns, sterile gloves, large sterile drape, hand hygiene, and Betadine. 1% lidocaine with epinephrine was injected under sterile conditions along the subcutaneous tunnel. Utilizing a combination of blunt dissection  and gentle traction, the catheter was removed intact. Hemostasis was obtained with manual compression. A dressing was placed. The patient tolerated the procedure well without immediate post procedural complication. IMPRESSION: Successful removal of tunneled dialysis catheter. Electronically Signed   By: Simonne Come M.D.   On: Jul 03, 2016 17:07   Dg Chest [facility]  Result Date: 07/11/2016 CLINICAL DATA:  Shortness of breath EXAM: PORTABLE CHEST 1 VIEW COMPARISON:  Portable chest x-ray of Jun 25, 2016 FINDINGS: The right hemidiaphragm remains elevated with resultant right lung hypoinflation. The left lung is better inflated. The retrocardiac lung markings are more conspicuous today. The cardiac silhouette is enlarged. The central pulmonary vascularity is mildly prominent though not greatly changed. The mediastinum is normal in width. There is no pleural effusion. IMPRESSION: Increased retrocardiac density on the left compatible with atelectasis or early pneumonia. There is probable low-grade CHF as well. Followup PA and lateral chest X-ray is recommended in 3-4 weeks following trial of antibiotic therapy to ensure resolution and exclude underlying malignancy. Electronically Signed   By: David  [place] M.D.   On: 07/11/2016 07:43   Dg Chest Port 1 View  Result Date: 06/25/2016 CLINICAL DATA:  Persistent low-grade fever with dyspnea. EXAM: PORTABLE CHEST 1 VIEW COMPARISON:  14-Jun-2016 FINDINGS: Interval removal of right IJ central venous catheter. Lungs are hypoinflated without focal consolidation or effusion. No pneumothorax. Cardiomediastinal silhouette and remainder of the exam is unchanged. IMPRESSION: Hypoinflation without acute cardiopulmonary disease. Electronically Signed   By: Elberta Fortis M.D.   On: 06/25/2016 15:55   Dg Abd Portable 1v  Result Date: 07/11/2016 CLINICAL DATA:  Diarrhea EXAM: PORTABLE ABDOMEN - 1 VIEW COMPARISON:  Jun 13, 2016 and Jun 20, 2016 FINDINGS: Gastrostomy catheter noted  in left upper abdomen. There is no bowel dilatation or air-fluid level to suggest bowel obstruction. No evident free air. Visualized lung bases are clear. There are phleboliths pelvis as well as foci of iliac artery calcification. IMPRESSION: Gastrostomy catheter in the upper abdomen. No bowel obstruction or free air. Iliac artery atherosclerosis noted. Electronically Signed   By: Bretta Bang III M.D.   On: 07/11/2016 10:56   Dg Abd Portable 1v  Result Date: 06/13/2016 CLINICAL DATA:  Nasogastric tube placement. EXAM: PORTABLE ABDOMEN - 1 VIEW COMPARISON:  06/10/2016. FINDINGS: Feeding tube extending into the mid stomach and curving back upon itself with its tip in the proximal stomach. Normal bowel gas pattern. Arterial calcifications. No acute bony abnormality. IMPRESSION: Feeding tube tip in the proximal stomach. Electronically Signed   By: Beckie Salts M.D.   On: 06/13/2016 20:02   Mr Brain Limited Wo Contrast  Result Date: 07/02/2016 CLINICAL DATA:  Increased lethargy.  Altered mental status. EXAM: MRI HEAD WITHOUT CONTRAST TECHNIQUE: Multiplanar, multiecho pulse sequences of the brain and surrounding structures were obtained without intravenous contrast. COMPARISON:  06-14-2016 FINDINGS: Brain: Intensely restricted diffusion in the lateral right thalamus and neighboring internal capsule, a 12 mm area. There is less intensely restricted diffusion in the medial right thalamus, periatrial white matter, and anterior right thalamus. The extent of acute and subacute infarct has progressed from Jun 14, 2016. No superimposed hemorrhage. 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 Remote infarct in the upper left pons and in the left corona radiata. Generalized atrophy with asymmetric wallerian changes seen in the left cortical spinal tracts. No acute hemorrhage, hydrocephalus, or mass. Limited study as requested by clinical team. Diffusion, FLAIR, and T2 weighted imaging acquired. Vascular:  No detected change in flow voids. Skull and upper cervical spine: Negative Sinuses/Orbits: Negative IMPRESSION: 1. Acute and subacute infarcts in the right thalamus, posterior limb internal capsule, and periatrial white matter, progressed from Jun 14, 2016. 2. Advanced remote ischemic injury. Electronically Signed   By: Marnee Spring M.D.   On: 07/02/2016 11:30    Time Spent in minutes  30  Susa Raring M.D on 07/12/2016 at 9:22 AM  Between 7am to 7pm - Pager - [phone] ( page via [email or web address], text pages only, please mention full 10 digit call back number). After 7pm go to [email or web address] - password [facility]

## 2016-07-12 NOTE — Procedures (Signed)
I was present at this session.  I have reviewed the session itself and made appropriate changes.  BPs in 130 sys range.  LUA AVG ok.  ?? Why 16 g..  Wgts.??.  Will go for 3.5 L.  Access press ok but flows low with 16ga. Needs more effic HD.  Zamyiah Tino L 6/1/20188:36 AM

## 2016-07-12 NOTE — Progress Notes (Signed)
Nutrition Follow-up  DOCUMENTATION CODES:   Morbid obesity  INTERVENTION:   -Recommend changing to Vital AF 1.2 at goal of 75 ml/hr providing 2160 kcals, 135 g of protein, 1450 mL of free water. Will discontinue Pro-stat supplement at this time as this may be contributing to loose stool and new TF formula/rate will meet protein needs   NUTRITION DIAGNOSIS:   Inadequate oral intake related to lethargy/confusion as evidenced by NPO status.  Being addressed via TF  GOAL:   Patient will meet greater than or equal to 90% of their needs  Met  MONITOR:   Diet advancement, PO intake, Weight trends, I & O's, TF tolerance  REASON FOR ASSESSMENT:   Malnutrition Screening Tool    ASSESSMENT:   41 y.o.woman (SNF resident) with PMH of CVA, DM2, morbid obesity, chronic lymphedema, HTN, chronic diastolic heart failure, depression, ESRD on HD, and infected leg ulcer. Admitted on 4/27 after her HD session with mental status changes.   Overall prognosis extremely poor per MD notes, Palliative care continues to follow, awaiting further decision from family regarding poc. 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  Estimated Nutritional Needs:   Kcal:  2000-2200  Protein:  120-130 gm  Fluid:  per MD  EDUCATION NEEDS:   No education needs identified at this time  [place], Phil Campbell, LDN [phone] Pager  [phone] Weekend/On-Call Pager

## 2016-07-12 NOTE — Progress Notes (Signed)
Palliative Care  I continue to have discussions with this family on her care. The husband is the legal decision maker but according to family has left town including their children. He called the other night to ask if we were "pulling the plug". I continue to have conversations with them about transition to comfort care-they are in such crisis as a family and in dealing with this situation that they cannot make decisions. I have been in discussion with [name] about LTACH bed today- she would need that level of care if we continue to be aggressive. Her potential for any meaningful recovery is extremely poor. She is no CPR, no Intubation. I prepared the family that she may die regardless of what we do for her at this point. Its all a matter of time before a truly terminal complication occurs. I am concerned about her children and they will need counseling- I talked to them about kids path. I will contact Wynona CanesChristine today to see where they are in thinking about her care.  Continuing to follow this patient and her family-appreciate teams involved allowing the time they need to get their affairs in order- it may come to all providers deciding that continuing antibiotics for untreatable infection and hemodialysis are not medically appropriate given her situation. If we get to this point I will need to arrange for support for them in handling the death -would ideally be best for a hospice house but I am not sure they will agree to this.  Time: 35 min Greater than 50%  of this time was spent counseling and coordinating care related to the above assessment and plan.  Anderson MaltaElizabeth Golding, DO Palliative Medicine

## 2016-07-12 NOTE — Progress Notes (Addendum)
Pharmacy Antibiotic Note  Linda DykesDavenia [name] [name] is a 41 y.o. female admitted on 06/02/2016 with L thigh soft tissue wound - s/p I&d x 2. Pt now growing GPC in clusters in 2/2 blood cx. BCID run but invalid.  Pharmacy has been consulted for Vancomycin dosing. Pt also on Zosyn.  Pt with ESRD - HD (MWF)  Plan: Vancomycin 2gm IV now and 1gm qHD Will f/u micro data, renal function, and pt's clinical condition Vanc trough prn   Height: 5\' 2"  (157.5 cm) Weight:  (scale on bed not working;) IBW/kg (Calculated) : 50.1  Temp (24hrs), Avg:101.1 F (38.4 C), Min:99.6 F (37.6 C), Max:102.4 F (39.1 C)   Recent Labs Lab 07/05/16 0845 07/05/16 0900 07/06/16 0234 07/08/16 0620 07/08/16 0811 07/10/16 0344  WBC 20.1* 17.9* 18.9*  --  17.6* 18.8*  CREATININE 2.44*  --  1.58* 3.11*  --  2.62*    Estimated Creatinine Clearance: 34.3 mL/min (A) (by C-G formula based on SCr of 2.62 mg/dL (H)).    Allergies  Allergen Reactions  . Azithromycin Other (See Comments)    Nose bleeding event    Antimicrobials this admission: Zosyn 5/25>> Vanc 4/30 >> 5/19; restart 6/1 >> Zosyn 4/30>> 5/2; 5/7 >> 5/19 Cefepime 5/2 >> 5/7  Microbiology results: 6/1 BCx x2: 2/2 GPC clusters (BCID invalid) 5/31 Cdiff:  Negative 5/24 BCx x 2: negative 5/17 BCx x2: negative 5/15 BCx x2: negative 5/1 LLE abscess: mult spec present 4/30 BCx: negative 4/10 wound culture: mult orgs present, abundant bacteroides ovatus beta lactamase +    Thank you for allowing pharmacy to be a part of this patient's care.  Christoper Fabianaron Laura Caldas, PharmD, BCPS Clinical pharmacist, pager [phone] 07/12/2016 6:16 AM

## 2016-07-12 NOTE — Progress Notes (Signed)
Residual .

## 2016-07-12 NOTE — Progress Notes (Signed)
PHARMACY - PHYSICIAN COMMUNICATION CRITICAL VALUE ALERT - BLOOD CULTURE IDENTIFICATION (BCID)  Results for orders placed or performed during the hospital encounter of 06/09/2016  Blood Culture ID Panel (Reflexed) (Collected: 07/11/2016  8:13 AM)  Result Value Ref Range   Enterococcus species NOT DETECTED NOT DETECTED   Listeria monocytogenes NOT DETECTED NOT DETECTED   Staphylococcus species DETECTED (A) NOT DETECTED   Staphylococcus aureus NOT DETECTED NOT DETECTED   Methicillin resistance DETECTED (A) NOT DETECTED   Streptococcus species NOT DETECTED NOT DETECTED   Streptococcus agalactiae NOT DETECTED NOT DETECTED   Streptococcus pneumoniae NOT DETECTED NOT DETECTED   Streptococcus pyogenes NOT DETECTED NOT DETECTED   Acinetobacter baumannii NOT DETECTED NOT DETECTED   Enterobacteriaceae species NOT DETECTED NOT DETECTED   Enterobacter cloacae complex NOT DETECTED NOT DETECTED   Escherichia coli NOT DETECTED NOT DETECTED   Klebsiella oxytoca NOT DETECTED NOT DETECTED   Klebsiella pneumoniae NOT DETECTED NOT DETECTED   Proteus species NOT DETECTED NOT DETECTED   Serratia marcescens NOT DETECTED NOT DETECTED   Haemophilus influenzae NOT DETECTED NOT DETECTED   Neisseria meningitidis NOT DETECTED NOT DETECTED   Pseudomonas aeruginosa NOT DETECTED NOT DETECTED   Candida albicans NOT DETECTED NOT DETECTED   Candida glabrata NOT DETECTED NOT DETECTED   Candida krusei NOT DETECTED NOT DETECTED   Candida parapsilosis NOT DETECTED NOT DETECTED   Candida tropicalis NOT DETECTED NOT DETECTED    Name of physician (or Provider) Contacted: None  Changes to prescribed antibiotics required: Patient is already on vancomycin.  Fredrik RiggerMarkle, [place] Tapp Sue 07/12/2016  6:08 PM

## 2016-07-12 DEATH — deceased

## 2016-07-13 ENCOUNTER — Inpatient Hospital Stay (HOSPITAL_COMMUNITY): Payer: Medicaid Other

## 2016-07-13 LAB — GLUCOSE, CAPILLARY
GLUCOSE-CAPILLARY: 118 mg/dL — AB (ref 65–99)
GLUCOSE-CAPILLARY: 93 mg/dL (ref 65–99)
Glucose-Capillary: 120 mg/dL — ABNORMAL HIGH (ref 65–99)
Glucose-Capillary: 99 mg/dL (ref 65–99)

## 2016-07-13 MED ORDER — MORPHINE SULFATE ER 30 MG PO TBCR
30.0000 mg | EXTENDED_RELEASE_TABLET | Freq: Two times a day (BID) | ORAL | Status: DC
  Administered 2016-07-13 – 2016-07-15 (×5): 30 mg via ORAL
  Filled 2016-07-13 (×5): qty 1

## 2016-07-13 MED ORDER — VITAL AF 1.2 CAL PO LIQD
1000.0000 mL | ORAL | Status: DC
  Administered 2016-07-13 – 2016-07-15 (×2): 1000 mL
  Filled 2016-07-13 (×4): qty 1000

## 2016-07-13 NOTE — Progress Notes (Signed)
Subjective: Interval History: no change in Neuro status  Objective: Vital signs in last 24 hours: Temp:  [98.8 F (37.1 C)-100.6 F (38.1 C)] 100.6 F (38.1 C) (06/02 0652) Pulse Rate:  [99-133] 114 (06/02 0652) Resp:  [20-25] 20 (06/02 0652) BP: (100-125)/(44-80) 120/44 (06/02 0652) SpO2:  [95 %-100 %] 100 % (06/02 0652) Weight change:   Intake/Output from previous day: 06/01 0701 - 06/02 0700 In: 550 [NG/GT:400; IV Piggyback:50] Out: 3500  Intake/Output this shift: No intake/output data recorded.  General appearance: morbidly obese and eyes follow sometimes, no coop or purposeful response Resp: diminished breath sounds bilaterally Cardio: S1, S2 normal and systolic murmur: systolic ejection 2/6, decrescendo at 2nd left intercostal space GI: obese, nodular tender panniculus, some reddness Extremitiebandage upper mid L thigh and mid and lower R thigh.braces anklesbandage upper mid L thigh and mid and lower R thigh.braces ankles  Lab Results:  Recent Labs  07/12/16 0553  WBC 23.6*  HGB 7.4*  HCT 23.5*  PLT 501*   BMET:  Recent Labs  07/12/16 0845  NA 132*  K 3.5  CL 95*  CO2 27  GLUCOSE 171*  BUN 54*  CREATININE 2.44*  CALCIUM 8.6*   No results for input(s): PTH in the last 72 hours. Iron Studies:  Recent Labs  07/12/16 1248  IRON 37  TIBC NOT CALCULATED  FERRITIN >7,500*    Studies/Results: Dg Abd Portable 1v  Result Date: 07/11/2016 CLINICAL DATA:  Diarrhea EXAM: PORTABLE ABDOMEN - 1 VIEW COMPARISON:  Jun 13, 2016 and Jun 20, 2016 FINDINGS: Gastrostomy catheter noted in left upper abdomen. There is no bowel dilatation or air-fluid level to suggest bowel obstruction. No evident free air. Visualized lung bases are clear. There are phleboliths pelvis as well as foci of iliac artery calcification. IMPRESSION: Gastrostomy catheter in the upper abdomen. No bowel obstruction or free air. Iliac artery atherosclerosis noted. Electronically Signed   By: Bretta BangWilliam   Woodruff III M.D.   On: 07/11/2016 10:56    I have reviewed the patient's current medications.  Assessment/Plan: 1 ESRD HD MWF.  Not candidate for long term therapy without significant improvement and is outside window of probable improvement. 2 CVAs 3 DM controlled 4 Leg ulcers 5 pannicilitis 6 Morbid obesity 7 Anemia P HD MWF, ??how much longer. Local care,      LOS: 35 days   November Sypher L 07/13/2016,8:53 AM

## 2016-07-13 NOTE — Progress Notes (Signed)
@IPLOG         PROGRESS NOTE                                                                                                                                                                                                             Patient Demographics:    [name], is a 41 y.o. female, DOB - 1975/12/28, ZOX:[id number]  Admit date - 05/12/2016   Admitting Physician Michael Litter, MD  Outpatient Primary MD for the patient is Marletta Lor, NP  LOS - 35  Chief Complaint  Patient presents with  . Altered Mental Status       Brief Narrative  [name] is a 41 y.o. female with a history of CVA, ESRD, infected leg ulcer, DM type 2, chronic lymphedema, HTN, chronic diastolic heart failure, depression. She presented with acute altered mental status and found to have new CVAs. She is now nonverbal, unable to move her extremities to verbal commands, currently only tracks with eyes at times. HD is being continued, she is partial code and we are awaiting placement. She has been seen by neurology, nephrology and palliative care this admission. She was transferred to my care on 07/10/2016 on day 32 of her hospital stay.   Subjective:   Patient in bed, Unable to answer questions or follow commands    Assessment  & Plan :     1.Obtundation. Due to multiple CVAs, current radiological evidence of Acute and subacute infarcts in the right thalamus, posterior limb internal capsule, and periatrial white matter, progressed from 05/22/2016, advanced remote ischemic injury - at this time prognosis appears to be poor, she remains aphasic, has dysphagia requiring PEG tube, besides eye movement to verbal cues she is unable to follow commands, reportedly she squeezed her daughter's hand a few days ago. However does not seem to have meaningful quality of life at this time. His been seen by neurology along with palliative care, family wants to continue present line of medical care. She is  currently on aspirin and statin via PEG tube. We await placement and further decision by family members. Husband at this time is not showing any interest in her care. Overall prognosis extremely poor. We'll continue to provide supportive care and monitor closely.  2. 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 Kindly see wound care notes for details, she has had infection in the wounds prior to this admission as well, has been seen by general surgery team this admission and they are not planning for any surgical debridement.  She was treated with IV antibiotics, was on on Zosyn since 07/05/2016 and vancomycin was added 07/12/2016 for repeat fevers. Have also added sodium thiosulfate.  3. ESRD. Nephrology on board currently on M,W, F dialysis schedule.  4. Fevers starting 07/10/2016 night. On IV Zosyn for #2 above, chest x-ray suggestive of possible re-aspiration, she also has multiple skin wounds, blood cultures from 07/11/2016 growing gram-positive cocci, so vancomycin was added on 07/12/2016 she was already on Zosyn since 07/05/2016 for her lower extremity wounds. Ruled out for C. difficile on 07/12/2016. Also Note earlier in the admission around 06/18/2016 there was a suspicion of vegetation and endocarditis this was ruled out by a TEE which did not show any growth. She has also been seen by ID earlier during this admission. Overall prognosis extremely poor. We'll continue to provide supportive care and monitor closely.  5. Aphasia and dysphagia due to #1 above. Currently nothing by mouth with all feeding and medications via PEG tube. Her tube feeding residuals are increasing hence I am dropped her to feed rate to 55 mL an hour from 75 an hour on 07/13/2016.  6. Hypertension. Table on beta blocker via PEG tube.  7. Diastolic CHF. EF 55-60%. Currently compensated from a heart standpoint, continue on beta blocker, excess fluid removal via dialysis.  8. Dyslipidemia. Again used to  be on statin via PEG tube.   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  10. DM II. Currently on cognition of Lantus and sliding scale.  Lab Results  Component Value Date   HGBA1C 6.7 (H) 06/09/2016   CBG (last 3)   Recent Labs  07/12/16 2006 07/12/16 2344 07/13/16 0410  GLUCAP 221* 224* 118*     Diet : Diet NPO time specified    Family Communication  : Called and talked to the sister on listed phone number in detail, informed her that her prognosis is extremely poor with extremely poor quality of life. She will come and visit the patient with their mother on 07/13/2016 and inform us about the future line of care. I told her to strongly consider DO NOT RESUSCITATE and comfort measures.  Code Status :  Okay for CPR and medications, no intubation or shocks  Disposition Plan  :  To be decided  Consults  :  Nephrology, neurology, palliative care, ID, Cards  Procedures  :  HD Monday Wednesday Friday  DVT Prophylaxis  :   Heparin    Lab Results  Component Value Date   PLT 501 (H) 07/12/2016    Inpatient Medications  Scheduled Meds: . acetaminophen  650 mg Oral TID  . aspirin  325 mg Per Tube Daily  . atorvastatin  20 mg Per Tube QPM  . buPROPion  100 mg Per Tube BID  . chlorhexidine  15 mL Mouth Rinse BID  . collagenase   Topical Daily  . darbepoetin (ARANESP) injection - DIALYSIS  200 mcg Intravenous Q Wed-HD  . FLUoxetine  40 mg Per Tube Daily  . heparin subcutaneous  5,000 Units Subcutaneous Q8H  . HYDROmorphone  2 mg Per Tube TID  . insulin aspart  0-20 Units Subcutaneous Q4H  . insulin glargine  35 Units Subcutaneous BID  . magic mouthwash  10 mL Oral TID  . metoprolol tartrate  25 mg Per Tube BID  . modafinil  200 mg Oral Daily  . morphine  30 mg Oral Q12H  . multivitamin  1 tablet Oral QHS  . Zinc Oxide   Topical BID  Continuous Infusions: . feeding supplement (VITAL AF 1.2 CAL)    . piperacillin-tazobactam (ZOSYN)  IV Stopped (07/13/16  0950)  . sodium chloride    . [START ON 07/15/2016] sodium thiosulfate infusion for calciphylaxis    . vancomycin Stopped (07/12/16 1116)   PRN Meds:.[DISCONTINUED] acetaminophen **OR** [DISCONTINUED] acetaminophen (TYLENOL) oral liquid 160 mg/5 mL **OR** acetaminophen, Gerhardt's butt cream, glucagon (human recombinant), hydrALAZINE, HYDROmorphone (DILAUDID) injection, metoprolol tartrate, ondansetron (ZOFRAN) IV  Antibiotics  :    Anti-infectives    Start     Dose/Rate Route Frequency Ordered Stop   07/12/16 1200  vancomycin (VANCOCIN) IVPB 1000 mg/200 mL premix     1,000 mg 200 mL/hr over 60 Minutes Intravenous Every M-W-F (Hemodialysis) 07/12/16 0616     07/12/16 1057  vancomycin (VANCOCIN) 1-5 GM/200ML-% IVPB    Comments:  Kiang, Angelito   : cabinet override      07/12/16 1057 07/12/16 1118   07/12/16 0630  vancomycin (VANCOCIN) 2,000 mg in sodium chloride 0.9 % 500 mL IVPB     2,000 mg 250 mL/hr over 120 Minutes Intravenous STAT 07/12/16 0616 07/12/16 0842   07/06/16 0400  piperacillin-tazobactam (ZOSYN) IVPB 3.375 g     3.375 g 12.5 mL/hr over 240 Minutes Intravenous Every 12 hours 07/05/16 1504     07/05/16 1600  piperacillin-tazobactam (ZOSYN) IVPB 3.375 g     3.375 g 100 mL/hr over 30 Minutes Intravenous  Once 07/05/16 1504 07/05/16 1848   06/28/16 0550  piperacillin-tazobactam (ZOSYN) IVPB 3.375 g  Status:  Discontinued     3.375 g 12.5 mL/hr over 240 Minutes Intravenous Every 12 hours 06/27/16 1844 06/29/16 1216   06/21/16 1200  vancomycin (VANCOCIN) IVPB 1000 mg/200 mL premix  Status:  Discontinued     1,000 mg 200 mL/hr over 60 Minutes Intravenous Every M-W-F (Hemodialysis) 06/19/16 1009 06/29/16 1216   06/20/16 0600  ceFAZolin (ANCEF) IVPB 2g/100 mL premix  Status:  Discontinued     2 g 200 mL/hr over 30 Minutes Intravenous To Radiology 06/19/16 1427 06/21/16 0600   06/19/16 1200  vancomycin (VANCOCIN) IVPB 750 mg/150 ml premix     750 mg 150 mL/hr over 60 Minutes  Intravenous Every M-W-F (Hemodialysis) 06/19/16 1009 06/19/16 1301   06/19/16 1158  Vancomycin (VANCOCIN) 750-5 MG/150ML-% IVPB    Comments:  Herriott, [name]   : cabinet override      06/19/16 1158 06/19/16 1215   06/22/2016 1300  piperacillin-tazobactam (ZOSYN) IVPB 3.375 g  Status:  Discontinued     3.375 g 12.5 mL/hr over 240 Minutes Intravenous Every 12 hours Jun 22, 2016 1231 06/27/16 1844   06/14/16 0931  vancomycin (VANCOCIN) 1-5 GM/200ML-% IVPB    Comments:  Serrano, [name]   : cabinet override      06/14/16 0931 06/14/16 1113   06/12/16 1700  ceFEPIme (MAXIPIME) 1 g in dextrose 5 % 50 mL IVPB  Status:  Discontinued     1 g 100 mL/hr over 30 Minutes Intravenous Every 24 hours 06/12/16 1648 06/22/2016 1205   06/12/16 0959  vancomycin (VANCOCIN) 1-5 GM/200ML-% IVPB    Comments:  Carlyon Prows   : cabinet override      06/12/16 0959 06/12/16 1015   06/10/16 1200  vancomycin (VANCOCIN) IVPB 1000 mg/200 mL premix  Status:  Discontinued     1,000 mg 200 mL/hr over 60 Minutes Intravenous Every M-W-F (Hemodialysis) 06/10/16 0851 06/19/16 1009   06/10/16 1000  piperacillin-tazobactam (ZOSYN) IVPB 3.375 g  Status:  Discontinued  3.375 g 12.5 mL/hr over 240 Minutes Intravenous Every 12 hours 06/10/16 0851 06/12/16 1629   06/10/16 1000  vancomycin (VANCOCIN) 2,250 mg in sodium chloride 0.9 % 500 mL IVPB  Status:  Discontinued     2,250 mg 250 mL/hr over 120 Minutes Intravenous Every 12 hours 06/10/16 0851 06/10/16 0858   06/10/16 1000  vancomycin (VANCOCIN) 2,250 mg in sodium chloride 0.9 % 500 mL IVPB     2,250 mg 250 mL/hr over 120 Minutes Intravenous  Once 06/10/16 0858 06/10/16 1640         Objective:   Vitals:   07/12/16 2046 07/13/16 0037 07/13/16 0652 07/13/16 1032  BP: 111/62 115/69 (!) 120/44 (!) 133/45  Pulse: (!) 107 99 (!) 114 (!) 120  Resp: 20 20 20 19   Temp: 99 F (37.2 C) 100 F (37.8 C) (!) 100.6 F (38.1 C) 99 F (37.2 C)  TempSrc: Axillary Axillary Axillary  Axillary  SpO2: 100% 100% 100% 100%  Height:        Wt Readings from Last 3 Encounters:  06/30/16 103.9 kg (229 lb)  05/31/16 135 kg (297 lb 9.9 oz)  03/29/16 (!) 167 kg (368 lb 3.2 oz)     Intake/Output Summary (Last 24 hours) at 07/13/16 1039 Last data filed at 07/13/16 0550  Gross per 24 hour  Intake              550 ml  Output             3500 ml  Net            -2950 ml     Physical Exam  Awake But can only move eyes to verbal commands, no meaningful purposeful movement otherwise [place].AT,PERRAL Supple Neck,No JVD, No cervical lymphadenopathy appriciated.  Symmetrical Chest wall movement, Good air movement bilaterally, CTAB RRR,No Gallops,Rubs or new Murmurs, No Parasternal Heave +ve B.Sounds, Abd Soft, No tenderness, No organomegaly appriciated, No rebound - guarding or rigidity.  PEG in place, bilat leg splints, rectal tube in place with liquid stool, multiple lower extremity ulcers under dressing kindly see wound care nursing notes for details.   Data Review:    CBC  Recent Labs Lab 07/08/16 0811 07/10/16 0344 07/12/16 0553  WBC 17.6* 18.8* 23.6*  HGB 8.0* 8.3* 7.4*  HCT 26.5* 27.8* 23.5*  PLT 505* 492* 501*  MCV 95.3 95.5 95.9  MCH 28.8 28.5 30.2  MCHC 30.2 29.9* 31.5  RDW 17.5* 17.3* 17.3*  LYMPHSABS  --  3.1  --   MONOABS  --  1.4*  --   EOSABS  --  0.4  --   BASOSABS  --  0.0  --     Chemistries   Recent Labs Lab 07/08/16 0620 07/10/16 0344 07/12/16 0845  NA 135 133* 132*  K 4.7 5.2* 3.5  CL 96* 95* 95*  CO2 26 26 27   GLUCOSE 149* 180* 171*  BUN 92* 67* 54*  CREATININE 3.11* 2.62* 2.44*  CALCIUM 8.7* 8.8* 8.6*   ------------------------------------------------------------------------------------------------------------------ No results for input(s): CHOL, HDL, LDLCALC, TRIG, CHOLHDL, LDLDIRECT in the last 72 hours.  Lab Results  Component Value Date   HGBA1C 6.7 (H) 06/09/2016    ------------------------------------------------------------------------------------------------------------------ No results for input(s): TSH, T4TOTAL, T3FREE, THYROIDAB in the last 72 hours.  Invalid input(s): FREET3 ------------------------------------------------------------------------------------------------------------------  Recent Labs  07/12/16 1248  FERRITIN >7,500*  TIBC NOT CALCULATED  IRON 37    Coagulation profile No results for input(s): INR, PROTIME in the last 168 hours.  No results for input(s): DDIMER in the last 72 hours.  Cardiac Enzymes No results for input(s): CKMB, TROPONINI, MYOGLOBIN in the last 168 hours.  Invalid input(s): CK ------------------------------------------------------------------------------------------------------------------    Component Value Date/Time   BNP 207.5 (H) 05/01/2016 1218    Micro Results Recent Results (from the past 240 hour(s))  Culture, blood (Routine X 2) w Reflex to ID Panel     Status: None   Collection Time:  12:36 PM  Result Value Ref Range Status   Specimen Description BLOOD RIGHT HAND  Final   Special Requests IN PEDIATRIC BOTTLE Blood Culture adequate volume  Final   Culture NO GROWTH 5 DAYS  Final   Report Status 07/10/2016 FINAL  Final  Culture, blood (Routine X 2) w Reflex to ID Panel     Status: None   Collection Time:  12:36 PM  Result Value Ref Range Status   Specimen Description BLOOD RIGHT HAND  Final   Special Requests IN PEDIATRIC BOTTLE Blood Culture adequate volume  Final   Culture NO GROWTH 5 DAYS  Final   Report Status 07/09/2016 FINAL  Final  Culture, blood (routine x 2)     Status: Abnormal (Preliminary result)   Collection Time: 07/11/16  8:13 AM  Result Value Ref Range Status   Specimen Description BLOOD LEFT ARM  Final   Special Requests   Final    IN PEDIATRIC BOTTLE Blood Culture results may not be optimal due to an excessive volume of blood received in  culture bottles   Culture  Setup Time   Final    IN PEDIATRIC BOTTLE GRAM POSITIVE COCCI IN CLUSTERS CRITICAL RESULT CALLED TO, READ BACK BY AND VERIFIED WITH: PHARMD K AMEND [id number] MLM    Culture (A)  Final    STAPHYLOCOCCUS SPECIES (COAGULASE NEGATIVE) SUSCEPTIBILITIES TO FOLLOW    Report Status PENDING  Incomplete  Blood Culture ID Panel (Reflexed)     Status: Abnormal   Collection Time: 07/11/16  8:13 AM  Result Value Ref Range Status   Enterococcus species NOT DETECTED NOT DETECTED Final   Listeria monocytogenes NOT DETECTED NOT DETECTED Final   Staphylococcus species DETECTED (A) NOT DETECTED Final    Comment: Methicillin (oxacillin) resistant coagulase negative staphylococcus. Possible blood culture contaminant (unless isolated from more than one blood culture draw or clinical case suggests pathogenicity). No antibiotic treatment is indicated for blood  culture contaminants. CRITICAL RESULT CALLED TO, READ BACK BY AND VERIFIED WITH: J. MARKLE, RPHARMD AT 1759 ON 07/12/16 BY C. JESSUP, MLT.    Staphylococcus aureus NOT DETECTED NOT DETECTED Final   Methicillin resistance DETECTED (A) NOT DETECTED Final    Comment: CRITICAL RESULT CALLED TO, READ BACK BY AND VERIFIED WITH: J. MARKLE, RPHARMD AT 1759 ON 07/12/16 BY C. JESSUP, MLT.    Streptococcus species NOT DETECTED NOT DETECTED Final   Streptococcus agalactiae NOT DETECTED NOT DETECTED Final   Streptococcus pneumoniae NOT DETECTED NOT DETECTED Final   Streptococcus pyogenes NOT DETECTED NOT DETECTED Final   Acinetobacter baumannii NOT DETECTED NOT DETECTED Final   Enterobacteriaceae species NOT DETECTED NOT DETECTED Final   Enterobacter cloacae complex NOT DETECTED NOT DETECTED Final   Escherichia coli NOT DETECTED NOT DETECTED Final   Klebsiella oxytoca NOT DETECTED NOT DETECTED Final   Klebsiella pneumoniae NOT DETECTED NOT DETECTED Final   Proteus species NOT DETECTED NOT DETECTED Final   Serratia marcescens NOT  DETECTED NOT DETECTED Final   Haemophilus influenzae NOT DETECTED NOT DETECTED Final  Neisseria meningitidis NOT DETECTED NOT DETECTED Final   Pseudomonas aeruginosa NOT DETECTED NOT DETECTED Final   Candida albicans NOT DETECTED NOT DETECTED Final   Candida glabrata NOT DETECTED NOT DETECTED Final   Candida krusei NOT DETECTED NOT DETECTED Final   Candida parapsilosis NOT DETECTED NOT DETECTED Final   Candida tropicalis NOT DETECTED NOT DETECTED Final  Culture, blood (routine x 2)     Status: Abnormal (Preliminary result)   Collection Time: 07/11/16  8:15 AM  Result Value Ref Range Status   Specimen Description BLOOD RIGHT HAND  Final   Special Requests IN PEDIATRIC BOTTLE Blood Culture adequate volume  Final   Culture  Setup Time   Final    IN PEDIATRIC BOTTLE GRAM POSITIVE COCCI IN CLUSTERS CRITICAL RESULT CALLED TO, READ BACK BY AND VERIFIED WITH: PHARMD K AMEND [id number] MLM RESULT CALLED TO, READ BACK BY AND VERIFIED WITH: J. MARKLE, RPHARMD AT 1759 ON 07/12/16 BY C. JESSUP, MLT.    Culture (A)  Final    STAPHYLOCOCCUS SPECIES (COAGULASE NEGATIVE) SUSCEPTIBILITIES TO FOLLOW    Report Status PENDING  Incomplete  C difficile quick scan w PCR reflex     Status: None   Collection Time: 07/11/16  7:39 PM  Result Value Ref Range Status   C Diff antigen NEGATIVE NEGATIVE Final   C Diff toxin NEGATIVE NEGATIVE Final   C Diff interpretation No C. difficile detected.  Final    Radiology Reports Ct Head Wo Contrast  Result Date: 06/18/2016 CLINICAL DATA:  Altered mental status. The patient began spontaneously nonverbal during dialysis 06/03/2016. Acute infarcts on brain MRI 06/08/2016. EXAM: CT HEAD WITHOUT CONTRAST TECHNIQUE: Contiguous axial images were obtained from the base of the skull through the vertex without intravenous contrast. COMPARISON:  Brain MRI and head CT scan 05/13/2016. FINDINGS: Brain: The brain is atrophic with extensive chronic microvascular ischemic change and  multiple remote infarcts bilaterally. Large infarct is again seen in the right cerebellum. No evidence of acute infarct, hemorrhage, mass lesion, mass effect, midline shift or abnormal extra-axial fluid collection. No hydrocephalus or pneumocephalus. Vascular: Atherosclerosis noted. Skull: Intact. Sinuses/Orbits: Negative. Other: None. IMPRESSION: No acute abnormality. Age advanced atrophy and multiple bilateral infarcts as seen on prior exams. Atherosclerosis. Electronically Signed   By: Drusilla Kanner M.D.   On: 06/18/2016 10:24   Ir Gastrostomy Tube Mod Sed  Result Date: 06/20/2016 CLINICAL DATA:  Previous stroke, dysphagia, needs enteral feeding support EXAM: PERC PLACEMENT GASTROSTOMY FLUOROSCOPY TIME:  2.5 minutes, 190 uGym2 DAP TECHNIQUE: The procedure, risks, benefits, and alternatives were explained to the patient and family. Questions regarding the procedure were encouraged and answered. The family understands and consents to the procedure. The patient was already receiving adequate prophylactic IV antibiotic coverage. Safe percutaneous approach was confirmed on a recent CT scan. A 5 French angiographic catheter was placed as orogastric tube. The upper abdomen was prepped with Betadine, draped in usual sterile fashion, and infiltrated locally with 1% lidocaine. Intravenous Fentanyl and Versed were administered as conscious sedation during continuous monitoring of the patient's level of consciousness and physiological / cardiorespiratory status by the radiology RN, with a total moderate sedation time of 22 minutes. Stomach was insufflated using air through the orogastric tube. An 80 French sheath needle was advanced percutaneously into the gastric lumen under fluoroscopy. Gas could be aspirated and a small contrast injection confirmed intraluminal spread. The sheath was exchanged over a guidewire for a 9 [place] vascular sheath, through which the snare device  was advanced and used to snare a guidewire  passed through the orogastric tube. This was withdrawn, and the snare attached to the 20 French pull-through gastrostomy tube, which was advanced antegrade, positioned with the internal bumper securing the anterior gastric wall to the anterior abdominal wall. Small contrast injection confirms appropriate positioning. The external bumper was applied and the catheter was flushed. COMPLICATIONS: COMPLICATIONS none IMPRESSION: 1. Technically successful 20 French pull-through gastrostomy placement under fluoroscopy. Electronically Signed   By: Corlis Leak M.D.   On: 06/20/2016 16:44   Ct Femur Left Wo Contrast  Result Date: 06/27/2016 CLINICAL DATA:  Encephalopathy.  Left leg wound EXAM: CT OF THE LOWER LEFT EXTREMITY WITHOUT CONTRAST TECHNIQUE: Multidetector CT imaging of the lower left extremity was performed according to the standard protocol. COMPARISON:  06/09/2016 FINDINGS: Bones/Joint/Cartilage No acute fracture or dislocation. Normal alignment. No joint effusion. No periosteal reaction or bone destruction. Tricompartmental mild -moderate osteoarthritis of the left knee with joint space narrowing and marginal osteophytosis. Ligaments Ligaments are suboptimally evaluated by CT. Muscles and Tendons Muscles are normal. Soft tissue Soft tissue wound along the inferior medial left thigh with a wound vac present. Soft tissue inflammation in the adjacent subcutaneous fat of the thigh. Soft tissue abnormality along the upper medial left thigh unchanged compared with the prior examination likely reflecting an area of scarring. No focal fluid collection to suggest an abscess. No soft tissue mass. Peripheral vascular atherosclerotic disease. IMPRESSION: 1. Soft tissue wound along the inferior medial left thigh with a wound vac present. Surrounding cellulitis. No drainable fluid collection to suggest an abscess. 2. Soft tissue abnormality along the upper medial left thigh unchanged compared with the prior examination likely  reflecting an area of scarring. Electronically Signed   By: Elige Ko   On: 06/27/2016 14:56   Ir Removal Tun Cv Cath W/o Fl  Result Date: 06/30/2016 INDICATION: History of end-stage renal disease, now with functional dialysis access. Dialysis catheter was initially placed by Dr. Lowella Dandy on 05/07/2016 and has functioned well throughout duration of usage. EXAM: REMOVAL OF TUNNELED HEMODIALYSIS CATHETER MEDICATIONS: None COMPLICATIONS: None immediate. PROCEDURE: Informed written consent was obtained from the patient's family following an explanation of the procedure, risks, benefits and alternatives to treatment. A time out was performed prior to the initiation of the procedure. Maximal barrier sterile technique was utilized including caps, mask, sterile gowns, sterile gloves, large sterile drape, hand hygiene, and Betadine. 1% lidocaine with epinephrine was injected under sterile conditions along the subcutaneous tunnel. Utilizing a combination of blunt dissection and gentle traction, the catheter was removed intact. Hemostasis was obtained with manual compression. A dressing was placed. The patient tolerated the procedure well without immediate post procedural complication. IMPRESSION: Successful removal of tunneled dialysis catheter. Electronically Signed   By: Simonne Come M.D.   On: 06/11/2016 17:07   Dg Chest Port 1 View  Result Date: 07/11/2016 CLINICAL DATA:  Shortness of breath EXAM: PORTABLE CHEST 1 VIEW COMPARISON:  Portable chest x-ray of Jun 25, 2016 FINDINGS: The right hemidiaphragm remains elevated with resultant right lung hypoinflation. The left lung is better inflated. The retrocardiac lung markings are more conspicuous today. The cardiac silhouette is enlarged. The central pulmonary vascularity is mildly prominent though not greatly changed. The mediastinum is normal in width. There is no pleural effusion. IMPRESSION: Increased retrocardiac density on the left compatible with atelectasis or  early pneumonia. There is probable low-grade CHF as well. Followup PA and lateral chest X-ray is recommended in 3-4  weeks following trial of antibiotic therapy to ensure resolution and exclude underlying malignancy. Electronically Signed   By: David  SwazilandJordan M.D.   On: 07/11/2016 07:43   Dg Chest Port 1 View  Result Date: 06/25/2016 CLINICAL DATA:  Persistent low-grade fever with dyspnea. EXAM: PORTABLE CHEST 1 VIEW COMPARISON:  05/25/2016 FINDINGS: Interval removal of right IJ central venous catheter. Lungs are hypoinflated without focal consolidation or effusion. No pneumothorax. Cardiomediastinal silhouette and remainder of the exam is unchanged. IMPRESSION: Hypoinflation without acute cardiopulmonary disease. Electronically Signed   By: Elberta Fortisaniel  Boyle M.D.   On: 06/25/2016 15:55   Dg Abd Portable 1v  Result Date: 07/11/2016 CLINICAL DATA:  Diarrhea EXAM: PORTABLE ABDOMEN - 1 VIEW COMPARISON:  Jun 13, 2016 and Jun 20, 2016 FINDINGS: Gastrostomy catheter noted in left upper abdomen. There is no bowel dilatation or air-fluid level to suggest bowel obstruction. No evident free air. Visualized lung bases are clear. There are phleboliths pelvis as well as foci of iliac artery calcification. IMPRESSION: Gastrostomy catheter in the upper abdomen. No bowel obstruction or free air. Iliac artery atherosclerosis noted. Electronically Signed   By: Bretta BangWilliam  Woodruff III M.D.   On: 07/11/2016 10:56   Dg Abd Portable 1v  Result Date: 06/13/2016 CLINICAL DATA:  Nasogastric tube placement. EXAM: PORTABLE ABDOMEN - 1 VIEW COMPARISON:  06/10/2016. FINDINGS: Feeding tube extending into the mid stomach and curving back upon itself with its tip in the proximal stomach. Normal bowel gas pattern. Arterial calcifications. No acute bony abnormality. IMPRESSION: Feeding tube tip in the proximal stomach. Electronically Signed   By: Beckie SaltsSteven  Reid M.D.   On: 06/13/2016 20:02   Mr Brain Limited Wo Contrast  Result Date:  07/02/2016 CLINICAL DATA:  Increased lethargy.  Altered mental status. EXAM: MRI HEAD WITHOUT CONTRAST TECHNIQUE: Multiplanar, multiecho pulse sequences of the brain and surrounding structures were obtained without intravenous contrast. COMPARISON:  05/20/2016 FINDINGS: Brain: Intensely restricted diffusion in the lateral right thalamus and neighboring internal capsule, a 12 mm area. There is less intensely restricted diffusion in the medial right thalamus, periatrial white matter, and anterior right thalamus. The extent of acute and subacute infarct has progressed from 05/25/2016. No superimposed hemorrhage. 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 Remote infarct in the upper left pons and in the left corona radiata. Generalized atrophy with asymmetric wallerian changes seen in the left cortical spinal tracts. No acute hemorrhage, hydrocephalus, or mass. Limited study as requested by clinical team. Diffusion, FLAIR, and T2 weighted imaging acquired. Vascular: No detected change in flow voids. Skull and upper cervical spine: Negative Sinuses/Orbits: Negative IMPRESSION: 1. 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 2. Advanced remote ischemic injury. Electronically Signed   By: Marnee SpringJonathon  Watts M.D.   On: 07/02/2016 11:30    Time Spent in minutes  30   Susa RaringPrashant Singh M.D on 07/13/2016 at 10:39 AM  Between 7am to 7pm - Pager - [phone] ( page via [email or web address], text pages only, please mention full 10 digit call back number). After 7pm go to [email or web address] - password [email or web address]

## 2016-07-13 NOTE — Progress Notes (Signed)
Residual noted at 0100 about 150 cc. Feeding restarted at 50 cc. This am at 0600 noted 80 cc residual and feeding restarted at 70 cc/hr.

## 2016-07-14 ENCOUNTER — Inpatient Hospital Stay (HOSPITAL_COMMUNITY): Payer: Medicaid Other

## 2016-07-14 LAB — GLUCOSE, CAPILLARY
GLUCOSE-CAPILLARY: 63 mg/dL — AB (ref 65–99)
GLUCOSE-CAPILLARY: 83 mg/dL (ref 65–99)
GLUCOSE-CAPILLARY: 92 mg/dL (ref 65–99)
GLUCOSE-CAPILLARY: 117 mg/dL — AB (ref 65–99)
Glucose-Capillary: 62 mg/dL — ABNORMAL LOW (ref 65–99)
Glucose-Capillary: 80 mg/dL (ref 65–99)
Glucose-Capillary: 87 mg/dL (ref 65–99)
Glucose-Capillary: 92 mg/dL (ref 65–99)
Glucose-Capillary: 99 mg/dL (ref 65–99)
Glucose-Capillary: 99 mg/dL (ref 65–99)
Glucose-Capillary: 99 mg/dL (ref 65–99)

## 2016-07-14 LAB — CULTURE, BLOOD (ROUTINE X 2): SPECIAL REQUESTS: ADEQUATE

## 2016-07-14 LAB — CBC
HEMATOCRIT: 24.9 % — AB (ref 36.0–46.0)
Hemoglobin: 7.7 g/dL — ABNORMAL LOW (ref 12.0–15.0)
MCH: 28.2 pg (ref 26.0–34.0)
MCHC: 30.9 g/dL (ref 30.0–36.0)
MCV: 91.2 fL (ref 78.0–100.0)
Platelets: 611 10*3/uL — ABNORMAL HIGH (ref 150–400)
RBC: 2.73 MIL/uL — AB (ref 3.87–5.11)
RDW: 17.1 % — ABNORMAL HIGH (ref 11.5–15.5)
WBC: 17.4 10*3/uL — AB (ref 4.0–10.5)

## 2016-07-14 MED ORDER — DEXTROSE 50 % IV SOLN
INTRAVENOUS | Status: AC
  Administered 2016-07-14: 25 mL
  Filled 2016-07-14: qty 50

## 2016-07-14 MED ORDER — DEXTROSE 50 % IV SOLN
INTRAVENOUS | Status: AC
Start: 2016-07-14 — End: 2016-07-14
  Administered 2016-07-14: 25 mL via INTRAVENOUS
  Filled 2016-07-14: qty 50

## 2016-07-14 MED ORDER — DEXTROSE 50 % IV SOLN: 50.0000 mL | Freq: Once | INTRAVENOUS | Status: DC

## 2016-07-14 MED ORDER — INSULIN GLARGINE 100 UNIT/ML ~~LOC~~ SOLN: 35.0000 [IU] | Freq: Every day | SUBCUTANEOUS | Status: DC

## 2016-07-14 NOTE — Progress Notes (Signed)
Gave Pt 25mL dextrose 50% IV injection for BG of 62.  BG rose to 99 and maintained level of 99 thirty minutes after administration. Did not administer the additional 25mL dextrose 50% IV injection. Will continue to monitor.

## 2016-07-14 NOTE — Progress Notes (Addendum)
@IPLOG         PROGRESS NOTE                                                                                                                                                                                                             Patient Demographics:    [name], is a 41 y.o. female, DOB - December 05, 1975, ZOX:[id number]  Admit date - 05/20/2016   Admitting Physician Michael Litter, MD  Outpatient Primary MD for the patient is Marletta Lor, NP  LOS - 36  Chief Complaint  Patient presents with  . Altered Mental Status       Brief Narrative  [name] is a 41 y.o. female with a history of CVA, ESRD, infected leg ulcer, DM type 2, chronic lymphedema, HTN, chronic diastolic heart failure, depression. She presented with acute altered mental status and found to have new CVAs. She is now nonverbal, unable to move her extremities to verbal commands, currently only tracks with eyes at times. HD is being continued, she is partial code and we are awaiting placement. She has been seen by neurology, nephrology and palliative care this admission. She was transferred to my care on 07/10/2016 on day 32 of her hospital stay.   Subjective:   Patient in bed, Unable to answer questions or follow commands    Assessment  & Plan :     1.Obtundation. Due to multiple CVAs, current radiological evidence of Acute and subacute infarcts in the right thalamus, posterior limb internal capsule, and periatrial white matter, progressed from 05/19/2016, advanced remote ischemic injury - at this time prognosis appears to be poor, she remains aphasic, has dysphagia requiring PEG tube, besides eye movement to verbal cues she is unable to follow commands, reportedly she squeezed her daughter's hand a few days ago. However does not seem to have meaningful quality of life at this time. His been seen by neurology along with palliative care, family wants to continue present line of medical care. She is  currently on aspirin and statin via PEG tube. We await placement and further decision by family members. Husband at this time is not showing any interest in her care. Overall prognosis extremely poor. We'll continue to provide supportive care and monitor closely.  2. Fevers starting 07/10/2016 night. Bacteremic with coag-negative staph 2 out of 2 blood cultures, methicillin resistant. This happened despite her being on Zosyn since 07/05/2016 for lower extremity wounds, we'll repeat echocardiogram to rule out endocarditis, her dialysis graft site looks stable. Potential source could  be multiple open skin wounds. Case discussed with ID physician Dr. Ilsa Iha. Continue vancomycin and monitor. Continues to have extremely poor quality of life and prognosis. Discussed with nephrologist Dr. Darrick Penna as well who agrees with the plan, he also recommends considering hospice and comfort measures. Discussed with family about hospice and comfort measures on 07/13/2016 they will discuss amongst themselves and meet with me again on 07/20/2016.  3. ESRD. Nephrology on board currently on M,W, F dialysis schedule.  4. 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 Kindly see wound care notes for details, she has had infection in the wounds prior to this admission as well, has been seen by general surgery team this admission and they are not planning for any surgical debridement. She was treated with IV antibiotics, was on on Zosyn since 07/05/2016 and vancomycin was added 07/12/2016 for repeat fevers. Have also added sodium thiosulfate.  5. Aphasia and dysphagia due to #1 above. Currently nothing by mouth with all feeding and medications via PEG tube. Her tube feeding residuals are increasing hence I am dropped her to feed rate to 55 mL an hour from 75 an hour on 07/13/2016. She has been having increased residuals and has flexor seal, we'll remove Lexiscan to rule out impaction, check abdominal x-ray on  07/14/2016.  6. Hypertension. Stable on beta blocker via PEG tube.  7. Diastolic CHF. EF 55-60%. Currently compensated from a heart standpoint, continue on beta blocker, excess fluid removal via dialysis.  8. Dyslipidemia. Again used to be on statin via PEG tube.   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  10. DM II. Due to increased residuals to feedings are held intermittently happens sugars have been running low, will cut down Lantus dose continue sliding scale and monitor CBGs closely.  Lab Results  Component Value Date   HGBA1C 6.7 (H) 06/09/2016   CBG (last 3)   Recent Labs  07/14/16 0402 07/14/16 0433 07/14/16 0817  GLUCAP 63* 83 80     Diet : Diet NPO time specified    Family Communication  : Called and talked to the sister on listed phone number in detail, informed her that her prognosis is extremely poor with extremely poor quality of life. She will come and visit the patient with their mother on 07/13/2016 and inform us about the future line of care. I told her to strongly consider DO NOT RESUSCITATE and comfort measures.  Code Status :  Okay for CPR and medications, no intubation or shocks  Disposition Plan  :  To be decided  Consults  :  Nephrology, neurology, palliative care, ID, Cards  Procedures  :  HD Monday Wednesday Friday  DVT Prophylaxis  :   Heparin    Lab Results  Component Value Date   PLT 611 (H) 07/14/2016    Inpatient Medications  Scheduled Meds: . acetaminophen  650 mg Oral TID  . aspirin  325 mg Per Tube Daily  . atorvastatin  20 mg Per Tube QPM  . buPROPion  100 mg Per Tube BID  . chlorhexidine  15 mL Mouth Rinse BID  . collagenase   Topical Daily  . darbepoetin (ARANESP) injection - DIALYSIS  200 mcg Intravenous Q Wed-HD  . FLUoxetine  40 mg Per Tube Daily  . heparin subcutaneous  5,000 Units Subcutaneous Q8H  . HYDROmorphone  2 mg Per Tube TID  . insulin aspart  0-20 Units Subcutaneous Q4H  . insulin glargine   35 Units Subcutaneous BID  . magic  mouthwash  10 mL Oral TID  . metoprolol tartrate  25 mg Per Tube BID  . modafinil  200 mg Oral Daily  . morphine  30 mg Oral Q12H  . multivitamin  1 tablet Oral QHS  . Zinc Oxide   Topical BID   Continuous Infusions: . feeding supplement (VITAL AF 1.2 CAL) 1,000 mL (07/13/16 1045)  . sodium chloride    . [START ON 07/15/2016] sodium thiosulfate infusion for calciphylaxis    . vancomycin Stopped (07/12/16 1116)   PRN Meds:.[DISCONTINUED] acetaminophen **OR** [DISCONTINUED] acetaminophen (TYLENOL) oral liquid 160 mg/5 mL **OR** acetaminophen, Gerhardt's butt cream, glucagon (human recombinant), hydrALAZINE, HYDROmorphone (DILAUDID) injection, metoprolol tartrate, ondansetron (ZOFRAN) IV  Antibiotics  :    Anti-infectives    Start     Dose/Rate Route Frequency Ordered Stop   07/12/16 1200  vancomycin (VANCOCIN) IVPB 1000 mg/200 mL premix     1,000 mg 200 mL/hr over 60 Minutes Intravenous Every M-W-F (Hemodialysis) 07/12/16 0616     07/12/16 1057  vancomycin (VANCOCIN) 1-5 GM/200ML-% IVPB    Comments:  Kiang, Angelito   : cabinet override      07/12/16 1057 07/12/16 1118   07/12/16 0630  vancomycin (VANCOCIN) 2,000 mg in sodium chloride 0.9 % 500 mL IVPB     2,000 mg 250 mL/hr over 120 Minutes Intravenous STAT 07/12/16 0616 07/12/16 0842   07/06/16 0400  piperacillin-tazobactam (ZOSYN) IVPB 3.375 g  Status:  Discontinued     3.375 g 12.5 mL/hr over 240 Minutes Intravenous Every 12 hours 07/05/16 1504 07/14/16 1021   07/05/16 1600  piperacillin-tazobactam (ZOSYN) IVPB 3.375 g     3.375 g 100 mL/hr over 30 Minutes Intravenous  Once 07/05/16 1504 07/05/16 1848   06/28/16 0550  piperacillin-tazobactam (ZOSYN) IVPB 3.375 g  Status:  Discontinued     3.375 g 12.5 mL/hr over 240 Minutes Intravenous Every 12 hours 06/27/16 1844 06/29/16 1216   06/21/16 1200  vancomycin (VANCOCIN) IVPB 1000 mg/200 mL premix  Status:  Discontinued     1,000 mg 200 mL/hr over  60 Minutes Intravenous Every M-W-F (Hemodialysis) 06/19/16 1009 06/29/16 1216   06/20/16 0600  ceFAZolin (ANCEF) IVPB 2g/100 mL premix  Status:  Discontinued     2 g 200 mL/hr over 30 Minutes Intravenous To Radiology 06/19/16 1427 06/21/16 0600   06/19/16 1200  vancomycin (VANCOCIN) IVPB 750 mg/150 ml premix     750 mg 150 mL/hr over 60 Minutes Intravenous Every M-W-F (Hemodialysis) 06/19/16 1009 06/19/16 1301   06/19/16 1158  Vancomycin (VANCOCIN) 750-5 MG/150ML-% IVPB    Comments:  Herriott, [name]   : cabinet override      06/19/16 1158 06/19/16 1215   07-02-16 1300  piperacillin-tazobactam (ZOSYN) IVPB 3.375 g  Status:  Discontinued     3.375 g 12.5 mL/hr over 240 Minutes Intravenous Every 12 hours 07/02/2016 1231 06/27/16 1844   06/14/16 0931  vancomycin (VANCOCIN) 1-5 GM/200ML-% IVPB    Comments:  Matilde Haymaker, [name]   : cabinet override      06/14/16 0931 06/14/16 1113   06/12/16 1700  ceFEPIme (MAXIPIME) 1 g in dextrose 5 % 50 mL IVPB  Status:  Discontinued     1 g 100 mL/hr over 30 Minutes Intravenous Every 24 hours 06/12/16 1648 07/02/16 1205   06/12/16 0959  vancomycin (VANCOCIN) 1-5 GM/200ML-% IVPB    Comments:  Carlyon Prows   : cabinet override      06/12/16 0959 06/12/16 1015   06/10/16 1200  vancomycin (  VANCOCIN) IVPB 1000 mg/200 mL premix  Status:  Discontinued     1,000 mg 200 mL/hr over 60 Minutes Intravenous Every M-W-F (Hemodialysis) 06/10/16 0851 06/19/16 1009   06/10/16 1000  piperacillin-tazobactam (ZOSYN) IVPB 3.375 g  Status:  Discontinued     3.375 g 12.5 mL/hr over 240 Minutes Intravenous Every 12 hours 06/10/16 0851 06/12/16 1629   06/10/16 1000  vancomycin (VANCOCIN) 2,250 mg in sodium chloride 0.9 % 500 mL IVPB  Status:  Discontinued     2,250 mg 250 mL/hr over 120 Minutes Intravenous Every 12 hours 06/10/16 0851 06/10/16 0858   06/10/16 1000  vancomycin (VANCOCIN) 2,250 mg in sodium chloride 0.9 % 500 mL IVPB     2,250 mg 250 mL/hr over 120 Minutes  Intravenous  Once 06/10/16 0858 06/10/16 1640         Objective:   Vitals:   07/13/16 2100 07/14/16 0007 07/14/16 0500 07/14/16 0954  BP: 134/64 137/69 (!) 141/66 (!) 95/50  Pulse: (!) 105 (!) 111 87 96  Resp: 18 20 18 20   Temp: 98.8 F (37.1 C) 98.7 F (37.1 C) 98.8 F (37.1 C) 98.5 F (36.9 C)  TempSrc: Axillary Axillary Axillary Axillary  SpO2: 98% 97% 97% 98%  Height:        Wt Readings from Last 3 Encounters:  06/30/16 103.9 kg (229 lb)  05/31/16 135 kg (297 lb 9.9 oz)  03/29/16 (!) 167 kg (368 lb 3.2 oz)     Intake/Output Summary (Last 24 hours) at 07/14/16 1021 Last data filed at 07/14/16 0300  Gross per 24 hour  Intake              250 ml  Output                0 ml  Net              250 ml     Physical Exam  Awake But can only move eyes to verbal commands, no meaningful purposeful movement otherwise .AT,PERRAL Supple Neck,No JVD, No cervical lymphadenopathy appriciated.  Symmetrical Chest wall movement, Good air movement bilaterally, CTAB RRR,No Gallops,Rubs or new Murmurs, No Parasternal Heave +ve B.Sounds, Abd Soft, No tenderness, No organomegaly appriciated, No rebound - guarding or rigidity.  PEG in place, bilat leg splints, rectal tube in place with liquid stool, multiple lower extremity ulcers under dressing kindly see wound care nursing notes for details.   Data Review:    CBC  Recent Labs Lab 07/08/16 0811 07/10/16 0344 07/12/16 0553 07/14/16 0915  WBC 17.6* 18.8* 23.6* 17.4*  HGB 8.0* 8.3* 7.4* 7.7*  HCT 26.5* 27.8* 23.5* 24.9*  PLT 505* 492* 501* 611*  MCV 95.3 95.5 95.9 91.2  MCH 28.8 28.5 30.2 28.2  MCHC 30.2 29.9* 31.5 30.9  RDW 17.5* 17.3* 17.3* 17.1*  LYMPHSABS  --  3.1  --   --   MONOABS  --  1.4*  --   --   EOSABS  --  0.4  --   --   BASOSABS  --  0.0  --   --     Chemistries   Recent Labs Lab 07/08/16 0620 07/10/16 0344 07/12/16 0845  NA 135 133* 132*  K 4.7 5.2* 3.5  CL 96* 95* 95*  CO2 26 26 27    GLUCOSE 149* 180* 171*  BUN 92* 67* 54*  CREATININE 3.11* 2.62* 2.44*  CALCIUM 8.7* 8.8* 8.6*   ------------------------------------------------------------------------------------------------------------------ No results for input(s): CHOL, HDL, LDLCALC, TRIG, CHOLHDL,  LDLDIRECT in the last 72 hours.  Lab Results  Component Value Date   HGBA1C 6.7 (H) 06/09/2016   ------------------------------------------------------------------------------------------------------------------ No results for input(s): TSH, T4TOTAL, T3FREE, THYROIDAB in the last 72 hours.  Invalid input(s): FREET3 ------------------------------------------------------------------------------------------------------------------  Recent Labs  07/12/16 1248  FERRITIN >7,500*  TIBC NOT CALCULATED  IRON 37    Coagulation profile No results for input(s): INR, PROTIME in the last 168 hours.  No results for input(s): DDIMER in the last 72 hours.  Cardiac Enzymes No results for input(s): CKMB, TROPONINI, MYOGLOBIN in the last 168 hours.  Invalid input(s): CK ------------------------------------------------------------------------------------------------------------------    Component Value Date/Time   BNP 207.5 (H) 05/01/2016 1218    Micro Results Recent Results (from the past 240 hour(s))  Culture, blood (Routine X 2) w Reflex to ID Panel     Status: None   Collection Time:  12:36 PM  Result Value Ref Range Status   Specimen Description BLOOD RIGHT HAND  Final   Special Requests IN PEDIATRIC BOTTLE Blood Culture adequate volume  Final   Culture NO GROWTH 5 DAYS  Final   Report Status 07/10/2016 FINAL  Final  Culture, blood (Routine X 2) w Reflex to ID Panel     Status: None   Collection Time:  12:36 PM  Result Value Ref Range Status   Specimen Description BLOOD RIGHT HAND  Final   Special Requests IN PEDIATRIC BOTTLE Blood Culture adequate volume  Final   Culture NO GROWTH 5 DAYS   Final   Report Status 07/09/2016 FINAL  Final  Culture, blood (routine x 2)     Status: Abnormal   Collection Time: 07/11/16  8:13 AM  Result Value Ref Range Status   Specimen Description BLOOD LEFT ARM  Final   Special Requests   Final    IN PEDIATRIC BOTTLE Blood Culture results may not be optimal due to an excessive volume of blood received in culture bottles   Culture  Setup Time   Final    IN PEDIATRIC BOTTLE GRAM POSITIVE COCCI IN CLUSTERS CRITICAL RESULT CALLED TO, READ BACK BY AND VERIFIED WITH: PHARMD K AMEND [id number] MLM    Culture STAPHYLOCOCCUS SPECIES (COAGULASE NEGATIVE) (A)  Final   Report Status 07/14/2016 FINAL  Final   Organism ID, Bacteria STAPHYLOCOCCUS SPECIES (COAGULASE NEGATIVE)  Final      Susceptibility   Staphylococcus species (coagulase negative) - MIC*    CIPROFLOXACIN >=8 RESISTANT Resistant     ERYTHROMYCIN >=8 RESISTANT Resistant     GENTAMICIN >=16 RESISTANT Resistant     OXACILLIN >=4 RESISTANT Resistant     TETRACYCLINE >=16 RESISTANT Resistant     VANCOMYCIN 1 SENSITIVE Sensitive     TRIMETH/SULFA 160 RESISTANT Resistant     CLINDAMYCIN >=8 RESISTANT Resistant     RIFAMPIN <=0.5 SENSITIVE Sensitive     Inducible Clindamycin NEGATIVE Sensitive     * STAPHYLOCOCCUS SPECIES (COAGULASE NEGATIVE)  Blood Culture ID Panel (Reflexed)     Status: Abnormal   Collection Time: 07/11/16  8:13 AM  Result Value Ref Range Status   Enterococcus species NOT DETECTED NOT DETECTED Final   Listeria monocytogenes NOT DETECTED NOT DETECTED Final   Staphylococcus species DETECTED (A) NOT DETECTED Final    Comment: Methicillin (oxacillin) resistant coagulase negative staphylococcus. Possible blood culture contaminant (unless isolated from more than one blood culture draw or clinical case suggests pathogenicity). No antibiotic treatment is indicated for blood  culture contaminants. CRITICAL RESULT CALLED TO, READ BACK BY AND  VERIFIED WITH: J. MARKLE, RPHARMD AT 1759  ON 07/12/16 BY C. JESSUP, MLT.    Staphylococcus aureus NOT DETECTED NOT DETECTED Final   Methicillin resistance DETECTED (A) NOT DETECTED Final    Comment: CRITICAL RESULT CALLED TO, READ BACK BY AND VERIFIED WITH: J. MARKLE, RPHARMD AT 1759 ON 07/12/16 BY C. JESSUP, MLT.    Streptococcus species NOT DETECTED NOT DETECTED Final   Streptococcus agalactiae NOT DETECTED NOT DETECTED Final   Streptococcus pneumoniae NOT DETECTED NOT DETECTED Final   Streptococcus pyogenes NOT DETECTED NOT DETECTED Final   Acinetobacter baumannii NOT DETECTED NOT DETECTED Final   Enterobacteriaceae species NOT DETECTED NOT DETECTED Final   Enterobacter cloacae complex NOT DETECTED NOT DETECTED Final   Escherichia coli NOT DETECTED NOT DETECTED Final   Klebsiella oxytoca NOT DETECTED NOT DETECTED Final   Klebsiella pneumoniae NOT DETECTED NOT DETECTED Final   Proteus species NOT DETECTED NOT DETECTED Final   Serratia marcescens NOT DETECTED NOT DETECTED Final   Haemophilus influenzae NOT DETECTED NOT DETECTED Final   Neisseria meningitidis NOT DETECTED NOT DETECTED Final   Pseudomonas aeruginosa NOT DETECTED NOT DETECTED Final   Candida albicans NOT DETECTED NOT DETECTED Final   Candida glabrata NOT DETECTED NOT DETECTED Final   Candida krusei NOT DETECTED NOT DETECTED Final   Candida parapsilosis NOT DETECTED NOT DETECTED Final   Candida tropicalis NOT DETECTED NOT DETECTED Final  Culture, blood (routine x 2)     Status: Abnormal   Collection Time: 07/11/16  8:15 AM  Result Value Ref Range Status   Specimen Description BLOOD RIGHT HAND  Final   Special Requests IN PEDIATRIC BOTTLE Blood Culture adequate volume  Final   Culture  Setup Time   Final    IN PEDIATRIC BOTTLE GRAM POSITIVE COCCI IN CLUSTERS CRITICAL RESULT CALLED TO, READ BACK BY AND VERIFIED WITH: PHARMD K AMEND [id number] MLM RESULT CALLED TO, READ BACK BY AND VERIFIED WITH: J. MARKLE, RPHARMD AT 1759 ON 07/12/16 BY C. JESSUP, MLT.    Culture  (A)  Final    STAPHYLOCOCCUS SPECIES (COAGULASE NEGATIVE) SUSCEPTIBILITIES PERFORMED ON PREVIOUS CULTURE WITHIN THE LAST 5 DAYS.    Report Status 07/14/2016 FINAL  Final  C difficile quick scan w PCR reflex     Status: None   Collection Time: 07/11/16  7:39 PM  Result Value Ref Range Status   C Diff antigen NEGATIVE NEGATIVE Final   C Diff toxin NEGATIVE NEGATIVE Final   C Diff interpretation No C. difficile detected.  Final    Radiology Reports Ct Head Wo Contrast  Result Date: 06/18/2016 CLINICAL DATA:  Altered mental status. The patient began spontaneously nonverbal during dialysis 05/14/2016. Acute infarcts on brain MRI 05/30/2016. EXAM: CT HEAD WITHOUT CONTRAST TECHNIQUE: Contiguous axial images were obtained from the base of the skull through the vertex without intravenous contrast. COMPARISON:  Brain MRI and head CT scan 05/19/2016. FINDINGS: Brain: The brain is atrophic with extensive chronic microvascular ischemic change and multiple remote infarcts bilaterally. Large infarct is again seen in the right cerebellum. No evidence of acute infarct, hemorrhage, mass lesion, mass effect, midline shift or abnormal extra-axial fluid collection. No hydrocephalus or pneumocephalus. Vascular: Atherosclerosis noted. Skull: Intact. Sinuses/Orbits: Negative. Other: None. IMPRESSION: No acute abnormality. Age advanced atrophy and multiple bilateral infarcts as seen on prior exams. Atherosclerosis. Electronically Signed   By: Drusilla Kanner M.D.   On: 06/18/2016 10:24   Ir Gastrostomy Tube Mod Sed  Result Date: 06/20/2016 CLINICAL DATA:  Previous  stroke, dysphagia, needs enteral feeding support EXAM: PERC PLACEMENT GASTROSTOMY FLUOROSCOPY TIME:  2.5 minutes, 190 uGym2 DAP TECHNIQUE: The procedure, risks, benefits, and alternatives were explained to the patient and family. Questions regarding the procedure were encouraged and answered. The family understands and consents to the procedure. The patient was  already receiving adequate prophylactic IV antibiotic coverage. Safe percutaneous approach was confirmed on a recent CT scan. A 5 French angiographic catheter was placed as orogastric tube. The upper abdomen was prepped with Betadine, draped in usual sterile fashion, and infiltrated locally with 1% lidocaine. Intravenous Fentanyl and Versed were administered as conscious sedation during continuous monitoring of the patient's level of consciousness and physiological / cardiorespiratory status by the radiology RN, with a total moderate sedation time of 22 minutes. Stomach was insufflated using air through the orogastric tube. An 69 French sheath needle was advanced percutaneously into the gastric lumen under fluoroscopy. Gas could be aspirated and a small contrast injection confirmed intraluminal spread. The sheath was exchanged over a guidewire for a 9 [place] vascular sheath, through which the snare device was advanced and used to snare a guidewire passed through the orogastric tube. This was withdrawn, and the snare attached to the 20 French pull-through gastrostomy tube, which was advanced antegrade, positioned with the internal bumper securing the anterior gastric wall to the anterior abdominal wall. Small contrast injection confirms appropriate positioning. The external bumper was applied and the catheter was flushed. COMPLICATIONS: COMPLICATIONS none IMPRESSION: 1. Technically successful 20 French pull-through gastrostomy placement under fluoroscopy. Electronically Signed   By: Corlis Leak M.D.   On: 06/20/2016 16:44   Ct Femur Left Wo Contrast  Result Date: 06/27/2016 CLINICAL DATA:  Encephalopathy.  Left leg wound EXAM: CT OF THE LOWER LEFT EXTREMITY WITHOUT CONTRAST TECHNIQUE: Multidetector CT imaging of the lower left extremity was performed according to the standard protocol. COMPARISON:  06/09/2016 FINDINGS: Bones/Joint/Cartilage No acute fracture or dislocation. Normal alignment. No joint effusion. No  periosteal reaction or bone destruction. Tricompartmental mild -moderate osteoarthritis of the left knee with joint space narrowing and marginal osteophytosis. Ligaments Ligaments are suboptimally evaluated by CT. Muscles and Tendons Muscles are normal. Soft tissue Soft tissue wound along the inferior medial left thigh with a wound vac present. Soft tissue inflammation in the adjacent subcutaneous fat of the thigh. Soft tissue abnormality along the upper medial left thigh unchanged compared with the prior examination likely reflecting an area of scarring. No focal fluid collection to suggest an abscess. No soft tissue mass. Peripheral vascular atherosclerotic disease. IMPRESSION: 1. Soft tissue wound along the inferior medial left thigh with a wound vac present. Surrounding cellulitis. No drainable fluid collection to suggest an abscess. 2. Soft tissue abnormality along the upper medial left thigh unchanged compared with the prior examination likely reflecting an area of scarring. Electronically Signed   By: Elige Ko   On: 06/27/2016 14:56   Ir Removal Tun Cv Cath W/o Fl  Result Date: 06/22/2016 INDICATION: History of end-stage renal disease, now with functional dialysis access. Dialysis catheter was initially placed by Dr. Lowella Dandy on 05/07/2016 and has functioned well throughout duration of usage. EXAM: REMOVAL OF TUNNELED HEMODIALYSIS CATHETER MEDICATIONS: None COMPLICATIONS: None immediate. PROCEDURE: Informed written consent was obtained from the patient's family following an explanation of the procedure, risks, benefits and alternatives to treatment. A time out was performed prior to the initiation of the procedure. Maximal barrier sterile technique was utilized including caps, mask, sterile gowns, sterile gloves, large sterile drape, hand hygiene,  and Betadine. 1% lidocaine with epinephrine was injected under sterile conditions along the subcutaneous tunnel. Utilizing a combination of blunt dissection and  gentle traction, the catheter was removed intact. Hemostasis was obtained with manual compression. A dressing was placed. The patient tolerated the procedure well without immediate post procedural complication. IMPRESSION: Successful removal of tunneled dialysis catheter. Electronically Signed   By: Simonne Come M.D.   On: 07/06/2016 17:07   Dg Chest Port 1 View  Result Date: 07/13/2016 CLINICAL DATA:  Shortness of breath. EXAM: PORTABLE CHEST 1 VIEW COMPARISON:  Chest x-rays dated 07/11/2016 and 06/25/2016. FINDINGS: Study is limited by hypoinspiratory effort and lordotic patient positioning. Given these limitations, lungs appear grossly clear. No confluent opacity to suggest a developing pneumonia. No pleural effusion or pneumothorax seen. Heart size and mediastinal contours appear stable. No acute or suspicious osseous finding. IMPRESSION: Limited exam due to low lung volumes and lordotic patient positioning. No acute findings. No evidence of pneumonia or pulmonary edema. Electronically Signed   By: Bary Richard M.D.   On: 07/13/2016 11:24   Dg Chest Port 1 View  Result Date: 07/11/2016 CLINICAL DATA:  Shortness of breath EXAM: PORTABLE CHEST 1 VIEW COMPARISON:  Portable chest x-ray of Jun 25, 2016 FINDINGS: The right hemidiaphragm remains elevated with resultant right lung hypoinflation. The left lung is better inflated. The retrocardiac lung markings are more conspicuous today. The cardiac silhouette is enlarged. The central pulmonary vascularity is mildly prominent though not greatly changed. The mediastinum is normal in width. There is no pleural effusion. IMPRESSION: Increased retrocardiac density on the left compatible with atelectasis or early pneumonia. There is probable low-grade CHF as well. Followup PA and lateral chest X-ray is recommended in 3-4 weeks following trial of antibiotic therapy to ensure resolution and exclude underlying malignancy. Electronically Signed   By: David  [place] M.D.    On: 07/11/2016 07:43   Dg Chest Port 1 View  Result Date: 06/25/2016 CLINICAL DATA:  Persistent low-grade fever with dyspnea. EXAM: PORTABLE CHEST 1 VIEW COMPARISON:  06/05/2016 FINDINGS: Interval removal of right IJ central venous catheter. Lungs are hypoinflated without focal consolidation or effusion. No pneumothorax. Cardiomediastinal silhouette and remainder of the exam is unchanged. IMPRESSION: Hypoinflation without acute cardiopulmonary disease. Electronically Signed   By: Elberta Fortis M.D.   On: 06/25/2016 15:55   Dg Abd Portable 1v  Result Date: 07/11/2016 CLINICAL DATA:  Diarrhea EXAM: PORTABLE ABDOMEN - 1 VIEW COMPARISON:  Jun 13, 2016 and Jun 20, 2016 FINDINGS: Gastrostomy catheter noted in left upper abdomen. There is no bowel dilatation or air-fluid level to suggest bowel obstruction. No evident free air. Visualized lung bases are clear. There are phleboliths pelvis as well as foci of iliac artery calcification. IMPRESSION: Gastrostomy catheter in the upper abdomen. No bowel obstruction or free air. Iliac artery atherosclerosis noted. Electronically Signed   By: Bretta Bang III M.D.   On: 07/11/2016 10:56   Mr Brain Limited Wo Contrast  Result Date: 07/02/2016 CLINICAL DATA:  Increased lethargy.  Altered mental status. EXAM: MRI HEAD WITHOUT CONTRAST TECHNIQUE: Multiplanar, multiecho pulse sequences of the brain and surrounding structures were obtained without intravenous contrast. COMPARISON:  06/10/2016 FINDINGS: Brain: Intensely restricted diffusion in the lateral right thalamus and neighboring internal capsule, a 12 mm area. There is less intensely restricted diffusion in the medial right thalamus, periatrial white matter, and anterior right thalamus. The extent of acute and subacute infarct has progressed from 05/31/2016. No superimposed hemorrhage. Extensive patchy remote infarct of  the bilateral cerebellum. Remote infarct in the upper left pons and in the left corona radiata.  Generalized atrophy with asymmetric wallerian changes seen in the left cortical spinal tracts. No acute hemorrhage, hydrocephalus, or mass. Limited study as requested by clinical team. Diffusion, FLAIR, and T2 weighted imaging acquired. Vascular: No detected change in flow voids. Skull and upper cervical spine: Negative Sinuses/Orbits: Negative IMPRESSION: 1. 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 2. Advanced remote ischemic injury. Electronically Signed   By: Marnee Spring M.D.   On: 07/02/2016 11:30    Time Spent in minutes  30   Susa Raring M.D on 07/14/2016 at 10:21 AM  Between 7am to 7pm - Pager - [phone] ( page via [email or web address], text pages only, please mention full 10 digit call back number). After 7pm go to [email or web address] - password [email or web address]

## 2016-07-14 NOTE — Progress Notes (Signed)
Changed all bandages on patients posterior, outter thiegh was done late yesterday.

## 2016-07-14 NOTE — Progress Notes (Signed)
Rectal tube removed per MD order. Will continue to monitor.

## 2016-07-14 NOTE — Progress Notes (Signed)
Hypoglycemic Event  CBG: 63  Treatment: 25 ml of 50% Dextrose  Symptoms: asymptomatic   Follow-up CBG: Time:0434 CBG Result:83  Possible Reasons for Event: Low Carb diet  Comments/MD notified: No orders at this time     Georga HackingMorton, Tajah Noguchi

## 2016-07-14 NOTE — Progress Notes (Signed)
Subjective: Interval History: no change, not coop, not tracking..  Objective: Vital signs in last 24 hours: Temp:  [98.6 F (37 C)-99 F (37.2 C)] 98.8 F (37.1 C) (06/03 0500) Pulse Rate:  [87-120] 87 (06/03 0500) Resp:  [18-20] 18 (06/03 0500) BP: (129-141)/(45-69) 141/66 (06/03 0500) SpO2:  [97 %-100 %] 97 % (06/03 0500) Weight change:   Intake/Output from previous day: 06/02 0701 - 06/03 0700 In: 250 [IV Piggyback:250] Out: -  Intake/Output this shift: No intake/output data recorded.  General appearance: moderately obese and noncoop, moaning, no approp responsiveness Resp: diminished breath sounds bilaterally and rhonchi scattered Cardio: S1, S2 normal and systolic murmur: systolic ejection 2/6, decrescendo at 2nd left intercostal space GI: obese, soft, , firm nodular, reddened, lower abdm Extremitiewounds both thighs, feet in boots, AVG LUA clotted.wounds both thighs, feet in boots, AVG LUA clotted.  Lab Results:  Recent Labs  07/12/16 0553  WBC 23.6*  HGB 7.4*  HCT 23.5*  PLT 501*   BMET:  Recent Labs  07/12/16 0845  NA 132*  K 3.5  CL 95*  CO2 27  GLUCOSE 171*  BUN 54*  CREATININE 2.44*  CALCIUM 8.6*   No results for input(s): PTH in the last 72 hours. Iron Studies:  Recent Labs  07/12/16 1248  IRON 37  TIBC NOT CALCULATED  FERRITIN >7,500*    Studies/Results: Dg Chest Port 1 View  Result Date: 07/13/2016 CLINICAL DATA:  Shortness of breath. EXAM: PORTABLE CHEST 1 VIEW COMPARISON:  Chest x-rays dated 07/11/2016 and 06/25/2016. FINDINGS: Study is limited by hypoinspiratory effort and lordotic patient positioning. Given these limitations, lungs appear grossly clear. No confluent opacity to suggest a developing pneumonia. No pleural effusion or pneumothorax seen. Heart size and mediastinal contours appear stable. No acute or suspicious osseous finding. IMPRESSION: Limited exam due to low lung volumes and lordotic patient positioning. No acute  findings. No evidence of pneumonia or pulmonary edema. Electronically Signed   By: Bary RichardStan  Maynard M.D.   On: 07/13/2016 11:24    I have reviewed the patient's current medications.  Assessment/Plan: 1 ESRD graft clotted, will  Get IR to declot, MWF  HD.   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  Need to progress to EOL issues 6 DM controlle 7 Staph in blood, multiple pos sources 8 Obesity 9 Schizoaffective P HD, declot, family meeting. AB    LOS: 36 days   Kiptyn Rafuse L 07/14/2016,8:31 AM

## 2016-07-15 ENCOUNTER — Inpatient Hospital Stay (HOSPITAL_COMMUNITY): Payer: Medicaid Other

## 2016-07-15 ENCOUNTER — Other Ambulatory Visit (HOSPITAL_COMMUNITY): Payer: Self-pay

## 2016-07-15 ENCOUNTER — Encounter (HOSPITAL_COMMUNITY): Payer: Self-pay | Admitting: Interventional Radiology

## 2016-07-15 HISTORY — PX: IR FLUORO GUIDE CV LINE RIGHT: IMG2283

## 2016-07-15 HISTORY — PX: IR US GUIDE VASC ACCESS RIGHT: IMG2390

## 2016-07-15 LAB — CBC WITH DIFFERENTIAL/PLATELET
BAND NEUTROPHILS: 11 %
BASOS ABS: 0 10*3/uL (ref 0.0–0.1)
BLASTS: 0 %
Basophils Relative: 0 %
EOS ABS: 0 10*3/uL (ref 0.0–0.7)
Eosinophils Relative: 0 %
HCT: 23.6 % — ABNORMAL LOW (ref 36.0–46.0)
HEMOGLOBIN: 7.2 g/dL — AB (ref 12.0–15.0)
LYMPHS PCT: 17 %
Lymphs Abs: 3.4 10*3/uL (ref 0.7–4.0)
MCH: 28.6 pg (ref 26.0–34.0)
MCHC: 30.5 g/dL (ref 30.0–36.0)
MCV: 93.7 fL (ref 78.0–100.0)
METAMYELOCYTES PCT: 1 %
Monocytes Absolute: 0.2 10*3/uL (ref 0.1–1.0)
Monocytes Relative: 1 %
Myelocytes: 0 %
Neutro Abs: 16.2 10*3/uL — ABNORMAL HIGH (ref 1.7–7.7)
Neutrophils Relative %: 70 %
Other: 0 %
PROMYELOCYTES ABS: 0 %
Platelets: 697 10*3/uL — ABNORMAL HIGH (ref 150–400)
RBC: 2.52 MIL/uL — ABNORMAL LOW (ref 3.87–5.11)
RDW: 17.4 % — ABNORMAL HIGH (ref 11.5–15.5)
WBC: 19.8 10*3/uL — ABNORMAL HIGH (ref 4.0–10.5)
nRBC: 0 /100 WBC

## 2016-07-15 LAB — RENAL FUNCTION PANEL
Albumin: 1.2 g/dL — ABNORMAL LOW (ref 3.5–5.0)
Anion gap: 26 — ABNORMAL HIGH (ref 5–15)
BUN: 81 mg/dL — ABNORMAL HIGH (ref 6–20)
CO2: 22 mmol/L (ref 22–32)
Calcium: 9.9 mg/dL (ref 8.9–10.3)
Chloride: 91 mmol/L — ABNORMAL LOW (ref 101–111)
Creatinine, Ser: 3.63 mg/dL — ABNORMAL HIGH (ref 0.44–1.00)
GFR calc Af Amer: 17 mL/min — ABNORMAL LOW
GFR calc non Af Amer: 15 mL/min — ABNORMAL LOW
Glucose, Bld: 192 mg/dL — ABNORMAL HIGH (ref 65–99)
Phosphorus: 6.2 mg/dL — ABNORMAL HIGH (ref 2.5–4.6)
Potassium: 4.8 mmol/L (ref 3.5–5.1)
Sodium: 139 mmol/L (ref 135–145)

## 2016-07-15 LAB — GLUCOSE, CAPILLARY
GLUCOSE-CAPILLARY: 134 mg/dL — AB (ref 65–99)
Glucose-Capillary: 139 mg/dL — ABNORMAL HIGH (ref 65–99)
Glucose-Capillary: 182 mg/dL — ABNORMAL HIGH (ref 65–99)
Glucose-Capillary: 205 mg/dL — ABNORMAL HIGH (ref 65–99)
Glucose-Capillary: 120 mg/dL — ABNORMAL HIGH (ref 65–99)

## 2016-07-15 LAB — PROTIME-INR
INR: 1.42
PROTHROMBIN TIME: 17.4 s — AB (ref 11.4–15.2)

## 2016-07-15 MED ORDER — LIDOCAINE HCL (PF) 1 % IJ SOLN: 5.0000 mL | INTRAMUSCULAR | Status: DC | PRN

## 2016-07-15 MED ORDER — VANCOMYCIN HCL IN DEXTROSE 1-5 GM/200ML-% IV SOLN
INTRAVENOUS | Status: AC
  Administered 2016-07-15: 1000 mg via INTRAVENOUS
  Filled 2016-07-15: qty 200

## 2016-07-15 MED ORDER — LIDOCAINE HCL 1 % IJ SOLN
INTRAMUSCULAR | Status: AC
  Filled 2016-07-15: qty 20

## 2016-07-15 MED ORDER — HYDROMORPHONE HCL 1 MG/ML IJ SOLN
INTRAMUSCULAR | Status: AC
  Administered 2016-07-15: 1 mg via INTRAVENOUS
  Filled 2016-07-15: qty 2

## 2016-07-15 MED ORDER — SODIUM CHLORIDE 0.9 % IV SOLN: 100.0000 mL | INTRAVENOUS | Status: DC | PRN

## 2016-07-15 MED ORDER — PENTAFLUOROPROP-TETRAFLUOROETH EX AERO: 1.0000 "application " | INHALATION_SPRAY | CUTANEOUS | Status: DC | PRN

## 2016-07-15 MED ORDER — LIDOCAINE-PRILOCAINE 2.5-2.5 % EX CREA
1.0000 | TOPICAL_CREAM | CUTANEOUS | Status: DC | PRN
Start: 2016-07-15 — End: 2016-07-15

## 2016-07-15 MED ORDER — HEPARIN SODIUM (PORCINE) 1000 UNIT/ML IJ SOLN
INTRAMUSCULAR | Status: AC
  Filled 2016-07-15: qty 1

## 2016-07-15 MED ORDER — HEPARIN SODIUM (PORCINE) 1000 UNIT/ML DIALYSIS
40.0000 [IU]/kg | Freq: Once | INTRAMUSCULAR | Status: AC
  Administered 2016-07-15: 4500 [IU] via INTRAVENOUS_CENTRAL
  Filled 2016-07-15: qty 5

## 2016-07-15 MED ORDER — LIDOCAINE HCL (PF) 1 % IJ SOLN
INTRAMUSCULAR | Status: AC
  Filled 2016-07-15: qty 5

## 2016-07-15 MED ORDER — HYDROMORPHONE HCL 1 MG/ML IJ SOLN
INTRAMUSCULAR | Status: AC
  Administered 2016-07-15: 1 mg via INTRAVENOUS
  Filled 2016-07-15: qty 1

## 2016-07-15 MED ORDER — ALTEPLASE 2 MG IJ SOLR: 2.0000 mg | Freq: Once | INTRAMUSCULAR | Status: DC | PRN

## 2016-07-15 MED ORDER — HEPARIN SODIUM (PORCINE) 1000 UNIT/ML DIALYSIS: 1000.0000 [IU] | INTRAMUSCULAR | Status: DC | PRN

## 2016-07-15 NOTE — Progress Notes (Signed)
Pt transported to dialysis @ 1500.

## 2016-07-15 NOTE — Procedures (Signed)
RIJV temp HD catheter SVC RA EBL 0 Comp 0 

## 2016-07-15 NOTE — Progress Notes (Signed)
Pharmacy Antibiotic Note [name] is a 41 y.o. female admitted on 06/04/2016 with L thigh soft tissue wound s/p I&D x2. Blood cultures from 5/31 growing methicillin-resistant coagulase-negative staph species in 2/2 bottles. Pt is ESRD on HD; however her graft clotted on Friday 6/1 and [facility]. Patient previously on HD - MWF but is not expected to be a long term dialysis candidate. If patient receives a full dialysis session today, based on calculations, vancomycin should still be in the therapeutic range. Previous vanc level: 37 on 6/1.  Zosyn 5/25 > Vancomycin 6/1 >  5/31 blood cx: 2/2 CoNS  Plan: -Check VR tomorrow morning -Continue zosyn 3.375 g IV q12h   Height: 5\' 2"  (157.5 cm) Weight:  (bedscale broke.) IBW/kg (Calculated) : 50.1  Temp (24hrs), Avg:98.6 F (37 C), Min:98.4 F (36.9 C), Max:98.9 F (37.2 C)   Recent Labs Lab 07/10/16 0344 07/12/16 0553 07/12/16 0845 07/12/16 1248 07/12/16 1539 07/14/16 0915  WBC 18.8* 23.6*  --   --   --  17.4*  CREATININE 2.62*  --  2.44*  --   --   --   VANCORANDOM  --   --   --  38 37  --     Estimated Creatinine Clearance: 36.8 mL/min (A) (by C-G formula based on SCr of 2.44 mg/dL (H)).    Allergies  Allergen Reactions  . Azithromycin Other (See Comments)    Nose bleeding event     Agapito GamesAlison Arlyce Circle, PharmD, BCPS Clinical Pharmacist 07/15/2016 9:43 AM

## 2016-07-15 NOTE — Progress Notes (Signed)
Pt received extra 200cc saline after dialysis to get BP over 100.  Prior to transfer back to unit BP was 101/58.

## 2016-07-15 NOTE — Progress Notes (Signed)
New issues with hemodialysis access- would not pursue additional access given her current terminal condition. A comfort care transition would be the medically reasonable way to proceed at this point. Im going to continue to try to contact this family and update them. Appreciate the support from all other providers in thoughtful prognostication and in providing medical interventions that are reasonable and support comfort.   Will document updates with family once I can reach them.  Anderson MaltaElizabeth Philander Ake, DO Palliative Medicine [phone]

## 2016-07-15 NOTE — Care Management Note (Signed)
Case Management Note  Patient Details  Name: [name] MRN: [id number] Date of Birth: 04/01/1975  Subjective/Objective:                    Action/Plan: Pt with clotted AV fistula. Per palliative note comfort approach is best option. Family to meet with Dr Thedore MinsSingh on 07/19/2016. CM following for d/c disposition.  Expected Discharge Date:                  Expected Discharge Plan:  Skilled Nursing Facility  In-House Referral:  Clinical Social Work  Discharge planning Services  CM Consult  Post Acute Care Choice:    Choice offered to:     DME Arranged:    DME Agency:     HH Arranged:    HH Agency:     Status of Service:  Completed, signed off  If discussed at [employer] Meetings, dates discussed:    Additional Comments:  Kermit BaloKelli F Latysha Thackston, RN 07/15/2016, 1:56 PM

## 2016-07-15 NOTE — Progress Notes (Signed)
Pt arrived onto unit from dialysis but was unable to receive dialysis d/t clotted access.

## 2016-07-15 NOTE — Progress Notes (Signed)
Hemodialysis: Pt with clotted system about halfway through HD tx.  Was able to rinse pts blood back to her, new system set up and tx resumed.  Pt has been consistently hypotensive for >1hour with BP's in the 70's.  Was able to get BP up to the 80's with saline bolus. Dr. Marisue HumbleSanford notified and orders given to d/t tx if pt remains hypotensive.

## 2016-07-15 NOTE — Progress Notes (Signed)
@IPLOG         PROGRESS NOTE                                                                                                                                                                                                             Patient Demographics:    [name], is a 41 y.o. female, DOB - [birth year], ZOX:[id number]  Admit date - 05/16/2016   Admitting Physician Michael Litter, MD  Outpatient Primary MD for the patient is Marletta Lor, NP  LOS - 37  Chief Complaint  Patient presents with  . Altered Mental Status       Brief Narrative  [name] is a 41 y.o. female with a history of CVA, ESRD, infected leg ulcer, DM type 2, chronic lymphedema, HTN, chronic diastolic heart failure, depression. She presented with acute altered mental status and found to have new CVAs. She is now nonverbal, unable to move her extremities to verbal commands, currently only tracks with eyes at times. HD is being continued, she is partial code and we are awaiting placement. She has been seen by neurology, nephrology and palliative care this admission. She was transferred to my care on 07/10/2016 on day 32 of her hospital stay.   Subjective:   Patient in bed, Unable to answer questions or follow commands    Assessment  & Plan :     1.Obtundation. Due to multiple CVAs, current radiological evidence of Acute and subacute infarcts in the right thalamus, posterior limb internal capsule, and periatrial white matter, progressed from 05/25/2016, advanced remote ischemic injury - at this time prognosis appears to be poor, she remains aphasic, has dysphagia requiring PEG tube, besides eye movement to verbal cues she is unable to follow commands, reportedly she squeezed her daughter's hand a few days ago. However does not seem to have meaningful quality of life at this time. Has been seen by neurology along with palliative care, family wants to continue present line of medical care. She is  currently on aspirin and statin via PEG tube. We await placement and further decision by family members. Husband at this time is not showing any interest in her care. Overall prognosis extremely poor. We'll continue to provide supportive care and monitor closely.  Note multiple physicians on this case including Dr. Standley Dakins, nephrologist Dr. Darrick Penna, nephrologist Dr. 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 We strongly recommend consideration of comfort measures, I tried calling patient's sister multiple times on 07/15/2016 but her phone seems to be switched off. Will reconsult palliative care.   2. Fevers starting 07/10/2016 night. Bacteremic with coag-negative staph 2 out of 2 blood cultures, methicillin resistant. This happened despite her being on Zosyn since 07/05/2016 for lower extremity wounds, pending repeat echocardiogram to rule out endocarditis, interestingly her dialysis graft got clotted off on 07/15/2016 and INR was called to declot, question if this is getting infected, other potential source could be multiple open skin wounds.   Case discussed with ID physician Dr. Ilsa Iha. Continue vancomycin and monitor. Continues to have extremely poor quality of life and prognosis. Discussed with nephrologist Dr. Darrick Penna as well who agrees with the plan, he also recommends considering hospice and comfort measures. Discussed with family about hospice and comfort measures on 07/13/2016 they will discuss amongst themselves and meet with me again on 07/20/2016.    3. ESRD. Nephrology on board currently on M,W, F dialysis schedule.  4. 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 Kindly see wound care notes for  details, she has had infection in the wounds prior to this admission as well, has been seen by general surgery team this admission and they are not planning for any surgical debridement. She was treated with IV antibiotics, was on on Zosyn since 07/05/2016 and vancomycin was added 07/12/2016 for repeat fevers. Have also added sodium thiosulfate.  5. Aphasia and dysphagia due to #1 above. Currently nothing by mouth with all feeding and medications via PEG tube. Her tube feeding residuals are increasing hence I am dropped her to feed rate to 55 mL an hour from 75 an hour on 07/13/2016. She has been having increased residuals and has flexor seal, we'll remove Lexiscan to rule out impaction, check abdominal x-ray on 07/14/2016.  6. Hypertension. Stable on beta blocker via PEG tube.  7. Diastolic CHF. EF 55-60%. Currently compensated from a heart standpoint, continue on beta blocker, excess fluid removal via dialysis.  8. Dyslipidemia. Again used to be on statin via PEG tube.   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  10. DM II. Due to increased residuals to feedings are held intermittently happens sugars have been running low, will cut down Lantus dose continue sliding scale and monitor CBGs closely.  Lab Results  Component Value Date   HGBA1C 6.7 (H) 06/09/2016   CBG (last 3)   Recent Labs  07/14/16 2329 07/15/16 0301 07/15/16 0931  GLUCAP 117* 139* 182*     Diet : Diet NPO time specified Except for: Sips with Meds    Family Communication  : Called and talked to the sister on listed phone number in detail, informed her that her prognosis is extremely poor with extremely poor quality of life. She will come and visit the patient with their mother on 07/13/2016 and inform us about the future line of care. I told her to strongly consider DO NOT RESUSCITATE and comfort measures.  Code Status :  Okay for CPR and medications, no intubation or shocks  Disposition Plan  :  To be  decided  Consults  :  Nephrology, neurology, palliative care, ID, Cards  Procedures  :  HD Monday Wednesday Friday  DVT Prophylaxis  :   Heparin    Lab Results  Component Value Date   PLT 611 (  H) 07/14/2016    Inpatient Medications  Scheduled Meds: . acetaminophen  650 mg Oral TID  . aspirin  325 mg Per Tube Daily  . atorvastatin  20 mg Per Tube QPM  . buPROPion  100 mg Per Tube BID  . chlorhexidine  15 mL Mouth Rinse BID  . collagenase   Topical Daily  . darbepoetin (ARANESP) injection - DIALYSIS  200 mcg Intravenous Q Wed-HD  . dextrose  50 mL Intravenous Once  . FLUoxetine  40 mg Per Tube Daily  . heparin subcutaneous  5,000 Units Subcutaneous Q8H  . HYDROmorphone  2 mg Per Tube TID  . insulin aspart  0-20 Units Subcutaneous Q4H  . magic mouthwash  10 mL Oral TID  . metoprolol tartrate  25 mg Per Tube BID  . modafinil  200 mg Oral Daily  . morphine  30 mg Oral Q12H  . multivitamin  1 tablet Oral QHS  . Zinc Oxide   Topical BID   Continuous Infusions: . feeding supplement (VITAL AF 1.2 CAL) 1,000 mL (07/13/16 1045)  . sodium chloride    . sodium thiosulfate infusion for calciphylaxis    . vancomycin Stopped (07/12/16 1116)   PRN Meds:.[DISCONTINUED] acetaminophen **OR** [DISCONTINUED] acetaminophen (TYLENOL) oral liquid 160 mg/5 mL **OR** acetaminophen, Gerhardt's butt cream, glucagon (human recombinant), hydrALAZINE, HYDROmorphone (DILAUDID) injection, metoprolol tartrate, ondansetron (ZOFRAN) IV  Antibiotics  :    Anti-infectives    Start     Dose/Rate Route Frequency Ordered Stop   07/12/16 1200  vancomycin (VANCOCIN) IVPB 1000 mg/200 mL premix     1,000 mg 200 mL/hr over 60 Minutes Intravenous Every M-W-F (Hemodialysis) 07/12/16 0616     07/12/16 1057  vancomycin (VANCOCIN) 1-5 GM/200ML-% IVPB    Comments:  Kiang, Angelito   : cabinet override      07/12/16 1057 07/12/16 1118   07/12/16 0630  vancomycin (VANCOCIN) 2,000 mg in sodium chloride 0.9 % 500 mL  IVPB     2,000 mg 250 mL/hr over 120 Minutes Intravenous STAT 07/12/16 0616 07/12/16 0842   07/06/16 0400  piperacillin-tazobactam (ZOSYN) IVPB 3.375 g  Status:  Discontinued     3.375 g 12.5 mL/hr over 240 Minutes Intravenous Every 12 hours 07/05/16 1504 07/14/16 1021   07/05/16 1600  piperacillin-tazobactam (ZOSYN) IVPB 3.375 g     3.375 g 100 mL/hr over 30 Minutes Intravenous  Once 07/05/16 1504 07/05/16 1848   06/28/16 0550  piperacillin-tazobactam (ZOSYN) IVPB 3.375 g  Status:  Discontinued     3.375 g 12.5 mL/hr over 240 Minutes Intravenous Every 12 hours 06/27/16 1844 06/29/16 1216   06/21/16 1200  vancomycin (VANCOCIN) IVPB 1000 mg/200 mL premix  Status:  Discontinued     1,000 mg 200 mL/hr over 60 Minutes Intravenous Every M-W-F (Hemodialysis) 06/19/16 1009 06/29/16 1216   06/20/16 0600  ceFAZolin (ANCEF) IVPB 2g/100 mL premix  Status:  Discontinued     2 g 200 mL/hr over 30 Minutes Intravenous To Radiology 06/19/16 1427 06/21/16 0600   06/19/16 1200  vancomycin (VANCOCIN) IVPB 750 mg/150 ml premix     750 mg 150 mL/hr over 60 Minutes Intravenous Every M-W-F (Hemodialysis) 06/19/16 1009 06/19/16 1301   06/19/16 1158  Vancomycin (VANCOCIN) 750-5 MG/150ML-% IVPB    Comments:  Herriott, [name]   : cabinet override      06/19/16 1158 06/19/16 1215   06/18/2016 1300  piperacillin-tazobactam (ZOSYN) IVPB 3.375 g  Status:  Discontinued     3.375 g 12.5 mL/hr over  240 Minutes Intravenous Every 12 hours 06/21/2016 1231 06/27/16 1844   06/14/16 0931  vancomycin (VANCOCIN) 1-5 GM/200ML-% IVPB    Comments:  Matilde Haymaker, [name]   : cabinet override      06/14/16 0931 06/14/16 1113   06/12/16 1700  ceFEPIme (MAXIPIME) 1 g in dextrose 5 % 50 mL IVPB  Status:  Discontinued     1 g 100 mL/hr over 30 Minutes Intravenous Every 24 hours 06/12/16 1648 07/10/2016 1205   06/12/16 0959  vancomycin (VANCOCIN) 1-5 GM/200ML-% IVPB    Comments:  Carlyon Prows   : cabinet override      06/12/16 0959 06/12/16  1015   06/10/16 1200  vancomycin (VANCOCIN) IVPB 1000 mg/200 mL premix  Status:  Discontinued     1,000 mg 200 mL/hr over 60 Minutes Intravenous Every M-W-F (Hemodialysis) 06/10/16 0851 06/19/16 1009   06/10/16 1000  piperacillin-tazobactam (ZOSYN) IVPB 3.375 g  Status:  Discontinued     3.375 g 12.5 mL/hr over 240 Minutes Intravenous Every 12 hours 06/10/16 0851 06/12/16 1629   06/10/16 1000  vancomycin (VANCOCIN) 2,250 mg in sodium chloride 0.9 % 500 mL IVPB  Status:  Discontinued     2,250 mg 250 mL/hr over 120 Minutes Intravenous Every 12 hours 06/10/16 0851 06/10/16 0858   06/10/16 1000  vancomycin (VANCOCIN) 2,250 mg in sodium chloride 0.9 % 500 mL IVPB     2,250 mg 250 mL/hr over 120 Minutes Intravenous  Once 06/10/16 0858 06/10/16 1640         Objective:   Vitals:   07/14/16 1829 07/14/16 2042 07/15/16 0046 07/15/16 0439  BP: (!) 113/45 (!) 106/49 (!) 116/53 (!) 109/55  Pulse: 93 (!) 101 98 99  Resp: 20 18 18 18   Temp: 98.5 F (36.9 C) 98.6 F (37 C) 98.9 F (37.2 C) 98.4 F (36.9 C)  TempSrc: Axillary Axillary Axillary Axillary  SpO2: 98% 98% 98% 99%  Height:        Wt Readings from Last 3 Encounters:  06/30/16 103.9 kg (229 lb)  05/31/16 135 kg (297 lb 9.9 oz)  03/29/16 (!) 167 kg (368 lb 3.2 oz)    No intake or output data in the 24 hours ending 07/15/16 1019   Physical Exam  Awake But can only move eyes to verbal commands, no meaningful purposeful movement otherwise [place].AT,PERRAL Supple Neck,No JVD, No cervical lymphadenopathy appriciated.  Symmetrical Chest wall movement, Good air movement bilaterally, CTAB RRR,No Gallops,Rubs or new Murmurs, No Parasternal Heave +ve B.Sounds, Abd Soft, No tenderness, No organomegaly appriciated, No rebound - guarding or rigidity.  PEG in place, bilat leg splints, rectal tube in place with liquid stool, multiple lower extremity ulcers under dressing kindly see wound care nursing notes for details.   Data Review:     CBC  Recent Labs Lab 07/10/16 0344 07/12/16 0553 07/14/16 0915  WBC 18.8* 23.6* 17.4*  HGB 8.3* 7.4* 7.7*  HCT 27.8* 23.5* 24.9*  PLT 492* 501* 611*  MCV 95.5 95.9 91.2  MCH 28.5 30.2 28.2  MCHC 29.9* 31.5 30.9  RDW 17.3* 17.3* 17.1*  LYMPHSABS 3.1  --   --   MONOABS 1.4*  --   --   EOSABS 0.4  --   --   BASOSABS 0.0  --   --     Chemistries   Recent Labs Lab 07/10/16 0344 07/12/16 0845  NA 133* 132*  K 5.2* 3.5  CL 95* 95*  CO2 26 27  GLUCOSE 180* 171*  BUN 67* 54*  CREATININE 2.62* 2.44*  CALCIUM 8.8* 8.6*   ------------------------------------------------------------------------------------------------------------------ No results for input(s): CHOL, HDL, LDLCALC, TRIG, CHOLHDL, LDLDIRECT in the last 72 hours.  Lab Results  Component Value Date   HGBA1C 6.7 (H) 06/09/2016   ------------------------------------------------------------------------------------------------------------------ No results for input(s): TSH, T4TOTAL, T3FREE, THYROIDAB in the last 72 hours.  Invalid input(s): FREET3 ------------------------------------------------------------------------------------------------------------------  Recent Labs  07/12/16 1248  FERRITIN >7,500*  TIBC NOT CALCULATED  IRON 37    Coagulation profile No results for input(s): INR, PROTIME in the last 168 hours.  No results for input(s): DDIMER in the last 72 hours.  Cardiac Enzymes No results for input(s): CKMB, TROPONINI, MYOGLOBIN in the last 168 hours.  Invalid input(s): CK ------------------------------------------------------------------------------------------------------------------    Component Value Date/Time   BNP 207.5 (H) 05/01/2016 1218    Micro Results Recent Results (from the past 240 hour(s))  Culture, blood (routine x 2)     Status: Abnormal   Collection Time: 07/11/16  8:13 AM  Result Value Ref Range Status   Specimen Description BLOOD LEFT ARM  Final   Special  Requests   Final    IN PEDIATRIC BOTTLE Blood Culture results may not be optimal due to an excessive volume of blood received in culture bottles   Culture  Setup Time   Final    IN PEDIATRIC BOTTLE GRAM POSITIVE COCCI IN CLUSTERS CRITICAL RESULT CALLED TO, READ BACK BY AND VERIFIED WITH: PHARMD K AMEND [id number] MLM    Culture STAPHYLOCOCCUS SPECIES (COAGULASE NEGATIVE) (A)  Final   Report Status 07/14/2016 FINAL  Final   Organism ID, Bacteria STAPHYLOCOCCUS SPECIES (COAGULASE NEGATIVE)  Final      Susceptibility   Staphylococcus species (coagulase negative) - MIC*    CIPROFLOXACIN >=8 RESISTANT Resistant     ERYTHROMYCIN >=8 RESISTANT Resistant     GENTAMICIN >=16 RESISTANT Resistant     OXACILLIN >=4 RESISTANT Resistant     TETRACYCLINE >=16 RESISTANT Resistant     VANCOMYCIN 1 SENSITIVE Sensitive     TRIMETH/SULFA 160 RESISTANT Resistant     CLINDAMYCIN >=8 RESISTANT Resistant     RIFAMPIN <=0.5 SENSITIVE Sensitive     Inducible Clindamycin NEGATIVE Sensitive     * STAPHYLOCOCCUS SPECIES (COAGULASE NEGATIVE)  Blood Culture ID Panel (Reflexed)     Status: Abnormal   Collection Time: 07/11/16  8:13 AM  Result Value Ref Range Status   Enterococcus species NOT DETECTED NOT DETECTED Final   Listeria monocytogenes NOT DETECTED NOT DETECTED Final   Staphylococcus species DETECTED (A) NOT DETECTED Final    Comment: Methicillin (oxacillin) resistant coagulase negative staphylococcus. Possible blood culture contaminant (unless isolated from more than one blood culture draw or clinical case suggests pathogenicity). No antibiotic treatment is indicated for blood  culture contaminants. CRITICAL RESULT CALLED TO, READ BACK BY AND VERIFIED WITH: J. MARKLE, RPHARMD AT 1759 ON 07/12/16 BY C. JESSUP, MLT.    Staphylococcus aureus NOT DETECTED NOT DETECTED Final   Methicillin resistance DETECTED (A) NOT DETECTED Final    Comment: CRITICAL RESULT CALLED TO, READ BACK BY AND VERIFIED WITH: J.  MARKLE, RPHARMD AT 1759 ON 07/12/16 BY C. JESSUP, MLT.    Streptococcus species NOT DETECTED NOT DETECTED Final   Streptococcus agalactiae NOT DETECTED NOT DETECTED Final   Streptococcus pneumoniae NOT DETECTED NOT DETECTED Final   Streptococcus pyogenes NOT DETECTED NOT DETECTED Final   Acinetobacter baumannii NOT DETECTED NOT DETECTED Final   Enterobacteriaceae species NOT DETECTED NOT DETECTED Final  Enterobacter cloacae complex NOT DETECTED NOT DETECTED Final   Escherichia coli NOT DETECTED NOT DETECTED Final   Klebsiella oxytoca NOT DETECTED NOT DETECTED Final   Klebsiella pneumoniae NOT DETECTED NOT DETECTED Final   Proteus species NOT DETECTED NOT DETECTED Final   Serratia marcescens NOT DETECTED NOT DETECTED Final   Haemophilus influenzae NOT DETECTED NOT DETECTED Final   Neisseria meningitidis NOT DETECTED NOT DETECTED Final   Pseudomonas aeruginosa NOT DETECTED NOT DETECTED Final   Candida albicans NOT DETECTED NOT DETECTED Final   Candida glabrata NOT DETECTED NOT DETECTED Final   Candida krusei NOT DETECTED NOT DETECTED Final   Candida parapsilosis NOT DETECTED NOT DETECTED Final   Candida tropicalis NOT DETECTED NOT DETECTED Final  Culture, blood (routine x 2)     Status: Abnormal   Collection Time: 07/11/16  8:15 AM  Result Value Ref Range Status   Specimen Description BLOOD RIGHT HAND  Final   Special Requests IN PEDIATRIC BOTTLE Blood Culture adequate volume  Final   Culture  Setup Time   Final    IN PEDIATRIC BOTTLE GRAM POSITIVE COCCI IN CLUSTERS CRITICAL RESULT CALLED TO, READ BACK BY AND VERIFIED WITH: PHARMD K AMEND [id number] MLM RESULT CALLED TO, READ BACK BY AND VERIFIED WITH: J. MARKLE, RPHARMD AT 1759 ON 07/12/16 BY C. JESSUP, MLT.    Culture (A)  Final    STAPHYLOCOCCUS SPECIES (COAGULASE NEGATIVE) SUSCEPTIBILITIES PERFORMED ON PREVIOUS CULTURE WITHIN THE LAST 5 DAYS.    Report Status 07/14/2016 FINAL  Final  C difficile quick scan w PCR reflex      Status: None   Collection Time: 07/11/16  7:39 PM  Result Value Ref Range Status   C Diff antigen NEGATIVE NEGATIVE Final   C Diff toxin NEGATIVE NEGATIVE Final   C Diff interpretation No C. difficile detected.  Final    Radiology Reports Ct Head Wo Contrast  Result Date: 06/18/2016 CLINICAL DATA:  Altered mental status. The patient began spontaneously nonverbal during dialysis 06/08/2016. Acute infarcts on brain MRI 05/25/2016. EXAM: CT HEAD WITHOUT CONTRAST TECHNIQUE: Contiguous axial images were obtained from the base of the skull through the vertex without intravenous contrast. COMPARISON:  Brain MRI and head CT scan 06/08/2016. FINDINGS: Brain: The brain is atrophic with extensive chronic microvascular ischemic change and multiple remote infarcts bilaterally. Large infarct is again seen in the right cerebellum. No evidence of acute infarct, hemorrhage, mass lesion, mass effect, midline shift or abnormal extra-axial fluid collection. No hydrocephalus or pneumocephalus. Vascular: Atherosclerosis noted. Skull: Intact. Sinuses/Orbits: Negative. Other: None. IMPRESSION: No acute abnormality. Age advanced atrophy and multiple bilateral infarcts as seen on prior exams. Atherosclerosis. Electronically Signed   By: Drusilla Kanner M.D.   On: 06/18/2016 10:24   Ir Gastrostomy Tube Mod Sed  Result Date: 06/20/2016 CLINICAL DATA:  Previous stroke, dysphagia, needs enteral feeding support EXAM: PERC PLACEMENT GASTROSTOMY FLUOROSCOPY TIME:  2.5 minutes, 190 uGym2 DAP TECHNIQUE: The procedure, risks, benefits, and alternatives were explained to the patient and family. Questions regarding the procedure were encouraged and answered. The family understands and consents to the procedure. The patient was already receiving adequate prophylactic IV antibiotic coverage. Safe percutaneous approach was confirmed on a recent CT scan. A 5 French angiographic catheter was placed as orogastric tube. The upper abdomen was  prepped with Betadine, draped in usual sterile fashion, and infiltrated locally with 1% lidocaine. Intravenous Fentanyl and Versed were administered as conscious sedation during continuous monitoring of the patient's level of consciousness  and physiological / cardiorespiratory status by the radiology RN, with a total moderate sedation time of 22 minutes. Stomach was insufflated using air through the orogastric tube. An 62 French sheath needle was advanced percutaneously into the gastric lumen under fluoroscopy. Gas could be aspirated and a small contrast injection confirmed intraluminal spread. The sheath was exchanged over a guidewire for a 9 [place] vascular sheath, through which the snare device was advanced and used to snare a guidewire passed through the orogastric tube. This was withdrawn, and the snare attached to the 20 French pull-through gastrostomy tube, which was advanced antegrade, positioned with the internal bumper securing the anterior gastric wall to the anterior abdominal wall. Small contrast injection confirms appropriate positioning. The external bumper was applied and the catheter was flushed. COMPLICATIONS: COMPLICATIONS none IMPRESSION: 1. Technically successful 20 French pull-through gastrostomy placement under fluoroscopy. Electronically Signed   By: Corlis Leak M.D.   On: 06/20/2016 16:44   Ct Femur Left Wo Contrast  Result Date: 06/27/2016 CLINICAL DATA:  Encephalopathy.  Left leg wound EXAM: CT OF THE LOWER LEFT EXTREMITY WITHOUT CONTRAST TECHNIQUE: Multidetector CT imaging of the lower left extremity was performed according to the standard protocol. COMPARISON:  06/09/2016 FINDINGS: Bones/Joint/Cartilage No acute fracture or dislocation. Normal alignment. No joint effusion. No periosteal reaction or bone destruction. Tricompartmental mild -moderate osteoarthritis of the left knee with joint space narrowing and marginal osteophytosis. Ligaments Ligaments are suboptimally evaluated by CT.  Muscles and Tendons Muscles are normal. Soft tissue Soft tissue wound along the inferior medial left thigh with a wound vac present. Soft tissue inflammation in the adjacent subcutaneous fat of the thigh. Soft tissue abnormality along the upper medial left thigh unchanged compared with the prior examination likely reflecting an area of scarring. No focal fluid collection to suggest an abscess. No soft tissue mass. Peripheral vascular atherosclerotic disease. IMPRESSION: 1. Soft tissue wound along the inferior medial left thigh with a wound vac present. Surrounding cellulitis. No drainable fluid collection to suggest an abscess. 2. Soft tissue abnormality along the upper medial left thigh unchanged compared with the prior examination likely reflecting an area of scarring. Electronically Signed   By: Elige Ko   On: 06/27/2016 14:56   Ir Removal Tun Cv Cath W/o Fl  Result Date: 06/28/2016 INDICATION: History of end-stage renal disease, now with functional dialysis access. Dialysis catheter was initially placed by Dr. Lowella Dandy on 05/07/2016 and has functioned well throughout duration of usage. EXAM: REMOVAL OF TUNNELED HEMODIALYSIS CATHETER MEDICATIONS: None COMPLICATIONS: None immediate. PROCEDURE: Informed written consent was obtained from the patient's family following an explanation of the procedure, risks, benefits and alternatives to treatment. A time out was performed prior to the initiation of the procedure. Maximal barrier sterile technique was utilized including caps, mask, sterile gowns, sterile gloves, large sterile drape, hand hygiene, and Betadine. 1% lidocaine with epinephrine was injected under sterile conditions along the subcutaneous tunnel. Utilizing a combination of blunt dissection and gentle traction, the catheter was removed intact. Hemostasis was obtained with manual compression. A dressing was placed. The patient tolerated the procedure well without immediate post procedural complication.  IMPRESSION: Successful removal of tunneled dialysis catheter. Electronically Signed   By: Simonne Come M.D.   On: 07/06/2016 17:07   Dg Chest Port 1 View  Result Date: 07/13/2016 CLINICAL DATA:  Shortness of breath. EXAM: PORTABLE CHEST 1 VIEW COMPARISON:  Chest x-rays dated 07/11/2016 and 06/25/2016. FINDINGS: Study is limited by hypoinspiratory effort and lordotic patient positioning. Given these  limitations, lungs appear grossly clear. No confluent opacity to suggest a developing pneumonia. No pleural effusion or pneumothorax seen. Heart size and mediastinal contours appear stable. No acute or suspicious osseous finding. IMPRESSION: Limited exam due to low lung volumes and lordotic patient positioning. No acute findings. No evidence of pneumonia or pulmonary edema. Electronically Signed   By: Bary RichardStan  Maynard M.D.   On: 07/13/2016 11:24   Dg Chest Port 1 View  Result Date: 07/11/2016 CLINICAL DATA:  Shortness of breath EXAM: PORTABLE CHEST 1 VIEW COMPARISON:  Portable chest x-ray of Jun 25, 2016 FINDINGS: The right hemidiaphragm remains elevated with resultant right lung hypoinflation. The left lung is better inflated. The retrocardiac lung markings are more conspicuous today. The cardiac silhouette is enlarged. The central pulmonary vascularity is mildly prominent though not greatly changed. The mediastinum is normal in width. There is no pleural effusion. IMPRESSION: Increased retrocardiac density on the left compatible with atelectasis or early pneumonia. There is probable low-grade CHF as well. Followup PA and lateral chest X-ray is recommended in 3-4 weeks following trial of antibiotic therapy to ensure resolution and exclude underlying malignancy. Electronically Signed   By: David  SwazilandJordan M.D.   On: 07/11/2016 07:43   Dg Chest Port 1 View  Result Date: 06/25/2016 CLINICAL DATA:  Persistent low-grade fever with dyspnea. EXAM: PORTABLE CHEST 1 VIEW COMPARISON:  05/26/2016 FINDINGS: Interval removal of  right IJ central venous catheter. Lungs are hypoinflated without focal consolidation or effusion. No pneumothorax. Cardiomediastinal silhouette and remainder of the exam is unchanged. IMPRESSION: Hypoinflation without acute cardiopulmonary disease. Electronically Signed   By: Elberta Fortisaniel  Boyle M.D.   On: 06/25/2016 15:55   Dg Abd Portable 1v  Result Date: 07/14/2016 CLINICAL DATA:  Nausea and possible constipation. EXAM: PORTABLE ABDOMEN - 1 VIEW COMPARISON:  07/11/2016 FINDINGS: Paucity of bowel gas without evidence of enteric obstruction. Unremarkable colonic stool burden. Gastrostomy tube overlies the left upper abdominal quadrant. Nondiagnostic evaluation for pneumoperitoneum secondary to supine patient positioning and exclusion of the lower thorax. No definite pneumatosis or portal venous gas. Multiple phleboliths overlie the lower pelvis bilaterally. Vascular calcifications overlie the lower pelvis bilaterally. No acute osseus abnormalities. IMPRESSION: 1. Paucity of bowel gas without evidence of enteric obstruction. 2. Gastrostomy tube overlies the left upper abdominal quadrant. Electronically Signed   By: Simonne ComeJohn  Watts M.D.   On: 07/14/2016 12:01   Dg Abd Portable 1v  Result Date: 07/11/2016 CLINICAL DATA:  Diarrhea EXAM: PORTABLE ABDOMEN - 1 VIEW COMPARISON:  Jun 13, 2016 and Jun 20, 2016 FINDINGS: Gastrostomy catheter noted in left upper abdomen. There is no bowel dilatation or air-fluid level to suggest bowel obstruction. No evident free air. Visualized lung bases are clear. There are phleboliths pelvis as well as foci of iliac artery calcification. IMPRESSION: Gastrostomy catheter in the upper abdomen. No bowel obstruction or free air. Iliac artery atherosclerosis noted. Electronically Signed   By: Bretta BangWilliam  Woodruff III M.D.   On: 07/11/2016 10:56   Mr Brain Limited Wo Contrast  Result Date: 07/02/2016 CLINICAL DATA:  Increased lethargy.  Altered mental status. EXAM: MRI HEAD WITHOUT CONTRAST  TECHNIQUE: Multiplanar, multiecho pulse sequences of the brain and surrounding structures were obtained without intravenous contrast. COMPARISON:  06/05/2016 FINDINGS: Brain: Intensely restricted diffusion in the lateral right thalamus and neighboring internal capsule, a 12 mm area. There is less intensely restricted diffusion in the medial right thalamus, periatrial white matter, and anterior right thalamus. The extent of acute and subacute infarct has progressed from 06/03/2016.  No superimposed hemorrhage. 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 Remote infarct in the upper left pons and in the left corona radiata. Generalized atrophy with asymmetric wallerian changes seen in the left cortical spinal tracts. No acute hemorrhage, hydrocephalus, or mass. Limited study as requested by clinical team. Diffusion, FLAIR, and T2 weighted imaging acquired. Vascular: No detected change in flow voids. Skull and upper cervical spine: Negative Sinuses/Orbits: Negative IMPRESSION: 1. Acute and subacute infarcts in the right thalamus, posterior limb internal capsule, and periatrial white matter, progressed from 05/19/2016. 2. Advanced remote ischemic injury. Electronically Signed   By: Marnee Spring M.D.   On: 07/02/2016 11:30    Time Spent in minutes  30   Susa Raring M.D on 07/15/2016 at 10:19 AM  Between 7am to 7pm - Pager - [phone] ( page via [email or web address], text pages only, please mention full 10 digit call back number). After 7pm go to [email or web address] - password [facility]

## 2016-07-15 NOTE — Progress Notes (Signed)
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 acetaminophen  650 mg Oral TID  . aspirin  325 mg Per Tube Daily  . atorvastatin  20 mg Per Tube QPM  . buPROPion  100 mg Per Tube BID  . chlorhexidine  15 mL Mouth Rinse BID  . collagenase   Topical Daily  . darbepoetin (ARANESP) injection - DIALYSIS  200 mcg Intravenous Q Wed-HD  . dextrose  50 mL Intravenous Once  . FLUoxetine  40 mg Per Tube Daily  . heparin subcutaneous  5,000 Units Subcutaneous Q8H  . HYDROmorphone  2 mg Per Tube TID  . insulin aspart  0-20 Units Subcutaneous Q4H  . magic mouthwash  10 mL Oral TID  . metoprolol tartrate  25 mg Per Tube BID  . modafinil  200 mg Oral Daily  . morphine  30 mg Oral Q12H  . multivitamin  1 tablet Oral QHS  . Zinc Oxide   Topical BID   Continuous Infusions: . feeding supplement (VITAL AF 1.2 CAL) 1,000 mL (07/13/16 1045)  . sodium chloride    . sodium thiosulfate infusion for calciphylaxis    . vancomycin Stopped (07/12/16 1116)   PRN Meds:.[DISCONTINUED] acetaminophen **OR** [DISCONTINUED] acetaminophen (TYLENOL) oral liquid 160 mg/5 mL **OR** acetaminophen, Gerhardt's butt cream, glucagon (human recombinant), hydrALAZINE, HYDROmorphone (DILAUDID) injection, metoprolol tartrate, ondansetron (ZOFRAN) IV  Current Labs: reviewed none in past 24h   Physical Exam:  Blood pressure (!) 109/55, pulse 99, temperature 98.4 F (36.9 C), temperature source Axillary, resp. rate 18, height 5\' 2"  (1.575 m), weight 113 kg (249 lb 1.9 oz), SpO2 99 %. Chronically Ill Appearing, moaning, no eye contact, not responsive No B/T of LUE AVG RRR Coarse BS B/L  A 1. ESRD 2. CNS Bacteremia 2/2 BCx on 5/31, on Vanc 3. S/p CVAs, poor neurological status and  prognosis 4. B/L Lower extremity ulcers, gangrenous, necrotic, surgery following; on thiosulfate for ? Of calciphlyaxis  P 1. IR to place Temp HD cath, HD today afterwards 2. Palliative care following 3. Cont to engage with family, support; I believe she will never be a candidate for HD as outpatient   Sabra Heckyan Bashir Marchetti MD 07/15/2016, 8:14 AM   Recent Labs Lab 07/10/16 0344 07/12/16 0845  NA 133* 132*  K 5.2* 3.5  CL 95* 95*  CO2 26 27  GLUCOSE 180* 171*  BUN 67* 54*  CREATININE 2.62* 2.44*  CALCIUM 8.8* 8.6*  PHOS 3.0 2.5    Recent Labs Lab 07/10/16 0344 07/12/16 0553 07/14/16 0915  WBC 18.8* 23.6* 17.4*  NEUTROABS 13.9*  --   --   HGB 8.3* 7.4* 7.7*  HCT 27.8* 23.5* 24.9*  MCV 95.5 95.9 91.2  PLT 492* 501* 611*

## 2016-07-15 NOTE — Progress Notes (Signed)
Patient back from hemodialysis

## 2016-07-15 NOTE — Progress Notes (Signed)
Patient bandages on her posterior were changed this morning, Lab was unable to obtain blood as she could not find a vein. If patient is going to receive long term IV therapy and blood draws, it would be in the patients best interest to have a pick line.

## 2016-07-15 NOTE — Progress Notes (Signed)
Pt access sites appears clotted with no thrills/bruit noted to left upper arm graft, confirmed by charge nurse and nephrologist made aware and received order to return pt back to her room for possible IR today.

## 2016-07-15 NOTE — Progress Notes (Signed)
Patient taken for dialysis

## 2016-07-15 NOTE — Progress Notes (Signed)
Hemodialysis: BP remains low, 77/56, pt rinsed back per Dr. Greig CastillaSanfords orders.  BP after rinseback is 92/20.

## 2016-07-15 NOTE — Addendum Note (Signed)
Addendum  created 07/15/16 1440 by Kiaja Shorty, MD   Sign clinical note    

## 2016-07-16 ENCOUNTER — Other Ambulatory Visit (HOSPITAL_COMMUNITY): Payer: Self-pay

## 2016-07-16 DIAGNOSIS — L0291 Cutaneous abscess, unspecified: Secondary | ICD-10-CM

## 2016-07-16 MED ORDER — SODIUM CHLORIDE 0.9 % IV BOLUS (SEPSIS)
250.0000 mL | Freq: Once | INTRAVENOUS | Status: AC
  Administered 2016-07-16: 250 mL via INTRAVENOUS

## 2016-07-21 MED FILL — Medication: Qty: 1 | Status: AC

## 2016-08-11 NOTE — Progress Notes (Signed)
Pt here for lengthy stay with multiple acute on chronic health problems. Comfort care had been recommended by several providers. Pt was a partial code-yes to meds, no CPR or intubation.  This am, pt had episode of bradycardia which quickly progressed to asystole. Code Blue called and Epinephrine given to no avail. Pt pronounced dead afterwards. Death certificate completed.  KJKG, NP [facility]

## 2016-08-11 NOTE — Significant Event (Addendum)
Rapid Response Event Note  Overview: Called by bedside RN regarding patients SBP-70s post HD. Time Called: 0120 Event Type: Hypotension  Initial Focused Assessment:   Interventions: Schorr, NP paged prior to calling RRT.  Advised RN to await NPs return call for orders since this patient is a HD patient. 250cc NS bolus ordered by Clearence PedSchorr, NP. Plan of Care (if not transferred): Monitor BP and patient.  Call RRT if further assistance needed.    Event Summary: Name of Physician Notified: Tama GanderKatherine Schorr, NP (paged by bedside RN prior to calling RRT) at [id number]      Jackquline Branca, Dimas AguasMindy Anderson

## 2016-08-11 NOTE — Code Documentation (Signed)
Called to bedside at 0310 d/t patients SBP-60s and HR-30s.  Upon assessment, patient not breathing with a weak pulse.  Patient LCB-ACLS meds only.  Code Blue called at 0310, see code sheet for code info. Pt expired at 0322.  Multiple attempts made by bedside RN and MD to contact family with no success.  Bedside RN and MD will continue to try and contact family.

## 2016-08-11 NOTE — Code Documentation (Signed)
CODE BLUE NOTE  Patient Name: Linda DykesDavenia McKinnon [name]   MRN: [id number]   Date of Birth/ Sex: 11/13/1975 , female      Admission Date: 05/13/2016  Attending Provider: Leroy SeaSingh, Prashant K, MD  Primary Diagnosis: Mental status, decreased [R41.82] Altered mental status, unspecified altered mental status type [R41.82] Cerebrovascular accident (CVA), unspecified mechanism (HCC) [I63.9]    Indication: Pt was in her usual state of health until this AM, when she was noted initially  to be bradycardic and the progressed to asystole. Code blue was subsequently called. At the time of arrival on scene, a limited ACLS protocol was underway according to patient's code status for medications only without compression or intubation   Technical Description:  - CPR performance duration:  None per patient's code status for medication only    - Was defibrillation or cardioversion used? No   - Was external pacer placed? Yes  - Was patient intubated pre/post CPR? No    Medications Administered: Y = Yes; Blank = No Amiodarone    Atropine    Calcium    Epinephrine  Y  Lidocaine    Magnesium    Norepinephrine    Phenylephrine    Sodium bicarbonate    Vasopressin    Other     Post CPR evaluation:  - Final Status - Was patient successfully resuscitated ? No   Miscellaneous Information:  - Time of death:  3:22 AM  - Primary team notified?  Yes  - Family Notified? RN currently attempting to contact family member listed in patient chart.        Leland HerYoo, Yafet Cline J, DO   07/27/2016, 3:23 AM

## 2016-08-11 NOTE — Progress Notes (Signed)
Changed all bandages on both thighs and buttocks applied santyl ointment per instructions manage orders and applied wet to dry.

## 2016-08-11 NOTE — Discharge Summary (Signed)
[facility] Death Note                                                                                                                                                                                               [name], is a 41 y.o. female, DOB - 11/14/1975, XBJ:[id number]  Admit date - 11-17-2016   Admitting Physician Michael LitterNikki Carter, MD  Outpatient Primary MD for the patient is Marletta LorBarr, Julie, NP  LOS - 38  Chief Complaint  Patient presents with  . Altered Mental Status       Notification: Marletta LorBarr, Julie, NP notified of death of 08/09/2016   Date and Time of Death - 07/30/2016, 3.50 am   Pronounced by - RN  History of present illness:   [name] is a 41 y.o. female with a history of -  CVA, ESRD, infected leg ulcer, DM type 2, chronic lymphedema, HTN, chronic diastolic heart failure, depression. She presented with acute altered mental status and found to have new CVAs. She is now nonverbal, unable to move her extremities to verbal commands, currently only tracks with eyes at times. HD is being continued, she is partial code and we are awaiting placement. She has been seen by neurology, nephrology and palliative care this admission. She was transferred to my care on 07/10/2016 on day 32 of her hospital stay, despite appropriate treatment she remained nearly obtunded all through her hospital stay, finally she became bacteremic again and finally passed away on 08/05/2016 3.50am.   Final Diagnoses:  Cause if death - Sepsis, Bacteremic, CVA  Signature  Susa RaringPrashant Singh M.D on 07/18/2016 at 7:11 AM  [facility]  Office Phone [phone]  Total clinical and documentation time for today Under 30 minutes   Last Note        [facility]        PROGRESS NOTE                                                                                                                      Patient Demographics:    [name], is a 41  y.o. female, DOB - 19-Apr-1975, [id number]  Admit date - 05/31/2016   Admitting Physician Michael Litter, MD  Outpatient Primary MD for the patient is Marletta Lor, NP  LOS - 38  Chief Complaint  Patient presents with  . Altered Mental Status       Brief Narrative  [name] is a 41 y.o. female with a history of CVA, ESRD, infected leg ulcer, DM type 2, chronic lymphedema, HTN, chronic diastolic heart failure, depression. She presented with acute altered mental status and found to have new CVAs. She is now nonverbal, unable to move her extremities to verbal commands, currently only tracks with eyes at times. HD is being continued, she is partial code and we are awaiting placement. She has been seen by neurology, nephrology and palliative care this admission. She was transferred to my care on 07/10/2016 on day 32 of her hospital stay.   Subjective:   Patient in bed, Unable to answer questions or follow commands    Assessment  & Plan :     1.Obtundation. Due to multiple CVAs, current radiological evidence of Acute and subacute infarcts in the right thalamus, posterior limb internal capsule, and periatrial white matter, progressed from 06/08/2016, advanced remote ischemic injury - at this time prognosis appears to be poor, she remains aphasic, has dysphagia requiring PEG tube, besides eye movement to verbal cues she is unable to follow commands, reportedly she squeezed her daughter's hand a few days ago. However does not seem to have meaningful quality of life at this time. Has been seen by neurology along with palliative care, family wants to continue present line of medical care. She is currently on aspirin and statin via PEG tube. We await placement and further decision by family members. Husband at this time is not showing any interest in her care.  Overall prognosis extremely poor. We'll continue to provide supportive care and monitor closely.  Note multiple physicians on this case including Dr. Standley Dakins, nephrologist Dr. Darrick Penna, nephrologist Dr. 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 We strongly recommend consideration of comfort measures, I tried calling patient's sister multiple times on 07/15/2016 but her phone seems to be switched off. Will reconsult palliative care.   2. Fevers starting 07/10/2016 night. Bacteremic with coag-negative staph 2 out of 2 blood cultures, methicillin resistant. This happened despite her being on Zosyn since 07/05/2016 for lower extremity wounds, pending repeat echocardiogram to rule out endocarditis, interestingly her dialysis graft got clotted off on 07/15/2016 and INR was called to declot, question if this is getting infected, other potential source could be multiple open skin wounds.   Case discussed with ID physician Dr. Ilsa Iha. Continue vancomycin and monitor. Continues to have extremely poor quality of life and prognosis. Discussed with nephrologist Dr. Darrick Penna as well who agrees with the plan, he also recommends considering hospice and comfort measures. Discussed with family about hospice and comfort measures on 07/13/2016 they will discuss amongst themselves and meet with me again on 07/22/2016.    3. ESRD. Nephrology on board currently on M,W, F dialysis schedule.  4. 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 Kindly see wound care notes for details, she has had infection in the wounds prior to this admission as well,  has been seen by general surgery team this admission and they are not planning for any  surgical debridement. She was treated with IV antibiotics, was on on Zosyn since 07/05/2016 and vancomycin was added 07/12/2016 for repeat fevers. Have also added sodium thiosulfate.  5. Aphasia and dysphagia due to #1 above. Currently nothing by mouth with all feeding and medications via PEG tube. Her tube feeding residuals are increasing hence I am dropped her to feed rate to 55 mL an hour from 75 an hour on 07/13/2016. She has been having increased residuals and has flexor seal, we'll remove Lexiscan to rule out impaction, check abdominal x-ray on 07/14/2016.  6. Hypertension. Stable on beta blocker via PEG tube.  7. Diastolic CHF. EF 55-60%. Currently compensated from a heart standpoint, continue on beta blocker, excess fluid removal via dialysis.  8. Dyslipidemia. Again used to be on statin via PEG tube.   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  10. DM II. Due to increased residuals to feedings are held intermittently happens sugars have been running low, will cut down Lantus dose continue sliding scale and monitor CBGs closely.  Lab Results  Component Value Date   HGBA1C 6.7 (H) 06/09/2016   CBG (last 3)   Recent Labs  07/15/16 1147 07/15/16 2111 07/15/16 2317  GLUCAP 205* 134* 120*     Diet : Diet NPO time specified Except for: Sips with Meds    Family Communication  : Called and talked to the sister on listed phone number in detail, informed her that her prognosis is extremely poor with extremely poor quality of life. She will come and visit the patient with their mother on 07/13/2016 and inform us about the future line of care. I told her to strongly consider DO NOT RESUSCITATE and comfort measures.  Code Status :  Okay for CPR and medications, no intubation or shocks  Disposition Plan  :  To be decided  Consults  :  Nephrology, neurology, palliative care, ID, Cards  Procedures  :  HD Monday Wednesday Friday  DVT Prophylaxis  :   Heparin    Lab  Results  Component Value Date   PLT 697 (H) 07/15/2016    Inpatient Medications  Scheduled Meds: . acetaminophen  650 mg Oral TID  . aspirin  325 mg Per Tube Daily  . atorvastatin  20 mg Per Tube QPM  . buPROPion  100 mg Per Tube BID  . chlorhexidine  15 mL Mouth Rinse BID  . collagenase   Topical Daily  . darbepoetin (ARANESP) injection - DIALYSIS  200 mcg Intravenous Q Wed-HD  . dextrose  50 mL Intravenous Once  . FLUoxetine  40 mg Per Tube Daily  . heparin subcutaneous  5,000 Units Subcutaneous Q8H  . HYDROmorphone  2 mg Per Tube TID  . insulin aspart  0-20 Units Subcutaneous Q4H  . magic mouthwash  10 mL Oral TID  . metoprolol tartrate  25 mg Per Tube BID  . modafinil  200 mg Oral Daily  . morphine  30 mg Oral Q12H  . multivitamin  1 tablet Oral QHS  . Zinc Oxide   Topical BID   Continuous Infusions: . feeding supplement (VITAL AF 1.2 CAL) 1,000 mL (07/15/16 2257)  . sodium chloride    . sodium thiosulfate infusion for calciphylaxis    . vancomycin Stopped (07/15/16 2203)   PRN Meds:.[DISCONTINUED] acetaminophen **OR** [DISCONTINUED] acetaminophen (TYLENOL) oral liquid 160 mg/5 mL **OR** acetaminophen, Gerhardt's butt cream, glucagon (human  recombinant), hydrALAZINE, HYDROmorphone (DILAUDID) injection, metoprolol tartrate, ondansetron (ZOFRAN) IV  Antibiotics  :    Anti-infectives    Start     Dose/Rate Route Frequency Ordered Stop   07/12/16 1200  vancomycin (VANCOCIN) IVPB 1000 mg/200 mL premix     1,000 mg 200 mL/hr over 60 Minutes Intravenous Every M-W-F (Hemodialysis) 07/12/16 0616     07/12/16 1057  vancomycin (VANCOCIN) 1-5 GM/200ML-% IVPB    Comments:  Kiang, Angelito   : cabinet override      07/12/16 1057 07/12/16 1118   07/12/16 0630  vancomycin (VANCOCIN) 2,000 mg in sodium chloride 0.9 % 500 mL IVPB     2,000 mg 250 mL/hr over 120 Minutes Intravenous STAT 07/12/16 0616 07/12/16 0842   07/06/16 0400  piperacillin-tazobactam (ZOSYN) IVPB 3.375 g  Status:   Discontinued     3.375 g 12.5 mL/hr over 240 Minutes Intravenous Every 12 hours 07/05/16 1504 07/14/16 1021   07/05/16 1600  piperacillin-tazobactam (ZOSYN) IVPB 3.375 g     3.375 g 100 mL/hr over 30 Minutes Intravenous  Once 07/05/16 1504 07/05/16 1848   06/28/16 0550  piperacillin-tazobactam (ZOSYN) IVPB 3.375 g  Status:  Discontinued     3.375 g 12.5 mL/hr over 240 Minutes Intravenous Every 12 hours 06/27/16 1844 06/29/16 1216   06/21/16 1200  vancomycin (VANCOCIN) IVPB 1000 mg/200 mL premix  Status:  Discontinued     1,000 mg 200 mL/hr over 60 Minutes Intravenous Every M-W-F (Hemodialysis) 06/19/16 1009 06/29/16 1216   06/20/16 0600  ceFAZolin (ANCEF) IVPB 2g/100 mL premix  Status:  Discontinued     2 g 200 mL/hr over 30 Minutes Intravenous To Radiology 06/19/16 1427 06/21/16 0600   06/19/16 1200  vancomycin (VANCOCIN) IVPB 750 mg/150 ml premix     750 mg 150 mL/hr over 60 Minutes Intravenous Every M-W-F (Hemodialysis) 06/19/16 1009 06/19/16 1301   06/19/16 1158  Vancomycin (VANCOCIN) 750-5 MG/150ML-% IVPB    Comments:  Herriott, [name]   : cabinet override      06/19/16 1158 06/19/16 1215   2016-07-07 1300  piperacillin-tazobactam (ZOSYN) IVPB 3.375 g  Status:  Discontinued     3.375 g 12.5 mL/hr over 240 Minutes Intravenous Every 12 hours 07/07/16 1231 06/27/16 1844   06/14/16 0931  vancomycin (VANCOCIN) 1-5 GM/200ML-% IVPB    Comments:  Matilde Haymaker, [name]   : cabinet override      06/14/16 0931 06/14/16 1113   06/12/16 1700  ceFEPIme (MAXIPIME) 1 g in dextrose 5 % 50 mL IVPB  Status:  Discontinued     1 g 100 mL/hr over 30 Minutes Intravenous Every 24 hours 06/12/16 1648 Jul 07, 2016 1205   06/12/16 0959  vancomycin (VANCOCIN) 1-5 GM/200ML-% IVPB    Comments:  Carlyon Prows   : cabinet override      06/12/16 0959 06/12/16 1015   06/10/16 1200  vancomycin (VANCOCIN) IVPB 1000 mg/200 mL premix  Status:  Discontinued     1,000 mg 200 mL/hr over 60 Minutes Intravenous Every M-W-F  (Hemodialysis) 06/10/16 0851 06/19/16 1009   06/10/16 1000  piperacillin-tazobactam (ZOSYN) IVPB 3.375 g  Status:  Discontinued     3.375 g 12.5 mL/hr over 240 Minutes Intravenous Every 12 hours 06/10/16 0851 06/12/16 1629   06/10/16 1000  vancomycin (VANCOCIN) 2,250 mg in sodium chloride 0.9 % 500 mL IVPB  Status:  Discontinued     2,250 mg 250 mL/hr over 120 Minutes Intravenous Every 12 hours 06/10/16 0851 06/10/16 0858   06/10/16 1000  vancomycin (VANCOCIN) 2,250 mg in sodium chloride 0.9 % 500 mL IVPB     2,250 mg 250 mL/hr over 120 Minutes Intravenous  Once 06/10/16 0858 06/10/16 1640         Objective:   Vitals:   07/15/16 2113 07/15/16 2312 Aug 05, 2016 0118 2016-08-05 0249  BP: (!) 84/37 (!) 103/38 (!) 77/29 (!) 63/42  Pulse: 96 92 89   Resp: (!) 22  (!) 22   Temp: 98.8 F (37.1 C)  97.9 F (36.6 C)   TempSrc: Axillary  Axillary   SpO2: 100%  99%   Height:        Wt Readings from Last 3 Encounters:  06/30/16 103.9 kg (229 lb)  05/31/16 135 kg (297 lb 9.9 oz)  03/29/16 (!) 167 kg (368 lb 3.2 oz)     Intake/Output Summary (Last 24 hours) at 2016/08/05 0711 Last data filed at 07/15/16 2000  Gross per 24 hour  Intake                0 ml  Output             1100 ml  Net            -1100 ml     Physical Exam  Awake But can only move eyes to verbal commands, no meaningful purposeful movement otherwise [place].AT,PERRAL Supple Neck,No JVD, No cervical lymphadenopathy appriciated.  Symmetrical Chest wall movement, Good air movement bilaterally, CTAB RRR,No Gallops,Rubs or new Murmurs, No Parasternal Heave +ve B.Sounds, Abd Soft, No tenderness, No organomegaly appriciated, No rebound - guarding or rigidity.  PEG in place, bilat leg splints, rectal tube in place with liquid stool, multiple lower extremity ulcers under dressing kindly see wound care nursing notes for details.   Data Review:    CBC  Recent Labs Lab 07/10/16 0344 07/12/16 0553 07/14/16 0915  07/15/16 1034  WBC 18.8* 23.6* 17.4* 19.8*  HGB 8.3* 7.4* 7.7* 7.2*  HCT 27.8* 23.5* 24.9* 23.6*  PLT 492* 501* 611* 697*  MCV 95.5 95.9 91.2 93.7  MCH 28.5 30.2 28.2 28.6  MCHC 29.9* 31.5 30.9 30.5  RDW 17.3* 17.3* 17.1* 17.4*  LYMPHSABS 3.1  --   --  3.4  MONOABS 1.4*  --   --  0.2  EOSABS 0.4  --   --  0.0  BASOSABS 0.0  --   --  0.0    Chemistries   Recent Labs Lab 07/10/16 0344 07/12/16 0845 07/15/16 1034  NA 133* 132* 139  K 5.2* 3.5 4.8  CL 95* 95* 91*  CO2 26 27 22   GLUCOSE 180* 171* 192*  BUN 67* 54* 81*  CREATININE 2.62* 2.44* 3.63*  CALCIUM 8.8* 8.6* 9.9   ------------------------------------------------------------------------------------------------------------------ No results for input(s): CHOL, HDL, LDLCALC, TRIG, CHOLHDL, LDLDIRECT in the last 72 hours.  Lab Results  Component Value Date   HGBA1C 6.7 (H) 06/09/2016   ------------------------------------------------------------------------------------------------------------------ No results for input(s): TSH, T4TOTAL, T3FREE, THYROIDAB in the last 72 hours.  Invalid input(s): FREET3 ------------------------------------------------------------------------------------------------------------------ No results for input(s): VITAMINB12, FOLATE, FERRITIN, TIBC, IRON, RETICCTPCT in the last 72 hours.  Coagulation profile  Recent Labs Lab 07/15/16 1034  INR 1.42    No results for input(s): DDIMER in the last 72 hours.  Cardiac Enzymes No results for input(s): CKMB, TROPONINI, MYOGLOBIN in the last 168 hours.  Invalid input(s): CK ------------------------------------------------------------------------------------------------------------------    Component Value Date/Time   BNP 207.5 (H) 05/01/2016 1218    Micro Results Recent Results (from the past  240 hour(s))  Culture, blood (routine x 2)     Status: Abnormal   Collection Time: 07/11/16  8:13 AM  Result Value Ref Range Status    Specimen Description BLOOD LEFT ARM  Final   Special Requests   Final    IN PEDIATRIC BOTTLE Blood Culture results may not be optimal due to an excessive volume of blood received in culture bottles   Culture  Setup Time   Final    IN PEDIATRIC BOTTLE GRAM POSITIVE COCCI IN CLUSTERS CRITICAL RESULT CALLED TO, READ BACK BY AND VERIFIED WITH: PHARMD K AMEND [id number] MLM    Culture STAPHYLOCOCCUS SPECIES (COAGULASE NEGATIVE) (A)  Final   Report Status 07/14/2016 FINAL  Final   Organism ID, Bacteria STAPHYLOCOCCUS SPECIES (COAGULASE NEGATIVE)  Final      Susceptibility   Staphylococcus species (coagulase negative) - MIC*    CIPROFLOXACIN >=8 RESISTANT Resistant     ERYTHROMYCIN >=8 RESISTANT Resistant     GENTAMICIN >=16 RESISTANT Resistant     OXACILLIN >=4 RESISTANT Resistant     TETRACYCLINE >=16 RESISTANT Resistant     VANCOMYCIN 1 SENSITIVE Sensitive     TRIMETH/SULFA 160 RESISTANT Resistant     CLINDAMYCIN >=8 RESISTANT Resistant     RIFAMPIN <=0.5 SENSITIVE Sensitive     Inducible Clindamycin NEGATIVE Sensitive     * STAPHYLOCOCCUS SPECIES (COAGULASE NEGATIVE)  Blood Culture ID Panel (Reflexed)     Status: Abnormal   Collection Time: 07/11/16  8:13 AM  Result Value Ref Range Status   Enterococcus species NOT DETECTED NOT DETECTED Final   Listeria monocytogenes NOT DETECTED NOT DETECTED Final   Staphylococcus species DETECTED (A) NOT DETECTED Final    Comment: Methicillin (oxacillin) resistant coagulase negative staphylococcus. Possible blood culture contaminant (unless isolated from more than one blood culture draw or clinical case suggests pathogenicity). No antibiotic treatment is indicated for blood  culture contaminants. CRITICAL RESULT CALLED TO, READ BACK BY AND VERIFIED WITH: J. MARKLE, RPHARMD AT 1759 ON 07/12/16 BY C. JESSUP, MLT.    Staphylococcus aureus NOT DETECTED NOT DETECTED Final   Methicillin resistance DETECTED (A) NOT DETECTED Final    Comment: CRITICAL  RESULT CALLED TO, READ BACK BY AND VERIFIED WITH: J. MARKLE, RPHARMD AT 1759 ON 07/12/16 BY C. JESSUP, MLT.    Streptococcus species NOT DETECTED NOT DETECTED Final   Streptococcus agalactiae NOT DETECTED NOT DETECTED Final   Streptococcus pneumoniae NOT DETECTED NOT DETECTED Final   Streptococcus pyogenes NOT DETECTED NOT DETECTED Final   Acinetobacter baumannii NOT DETECTED NOT DETECTED Final   Enterobacteriaceae species NOT DETECTED NOT DETECTED Final   Enterobacter cloacae complex NOT DETECTED NOT DETECTED Final   Escherichia coli NOT DETECTED NOT DETECTED Final   Klebsiella oxytoca NOT DETECTED NOT DETECTED Final   Klebsiella pneumoniae NOT DETECTED NOT DETECTED Final   Proteus species NOT DETECTED NOT DETECTED Final   Serratia marcescens NOT DETECTED NOT DETECTED Final   Haemophilus influenzae NOT DETECTED NOT DETECTED Final   Neisseria meningitidis NOT DETECTED NOT DETECTED Final   Pseudomonas aeruginosa NOT DETECTED NOT DETECTED Final   Candida albicans NOT DETECTED NOT DETECTED Final   Candida glabrata NOT DETECTED NOT DETECTED Final   Candida krusei NOT DETECTED NOT DETECTED Final   Candida parapsilosis NOT DETECTED NOT DETECTED Final   Candida tropicalis NOT DETECTED NOT DETECTED Final  Culture, blood (routine x 2)     Status: Abnormal   Collection Time: 07/11/16  8:15 AM  Result Value Ref  Range Status   Specimen Description BLOOD RIGHT HAND  Final   Special Requests IN PEDIATRIC BOTTLE Blood Culture adequate volume  Final   Culture  Setup Time   Final    IN PEDIATRIC BOTTLE GRAM POSITIVE COCCI IN CLUSTERS CRITICAL RESULT CALLED TO, READ BACK BY AND VERIFIED WITH: PHARMD K AMEND [id number] MLM RESULT CALLED TO, READ BACK BY AND VERIFIED WITH: J. MARKLE, RPHARMD AT 1759 ON 07/12/16 BY C. JESSUP, MLT.    Culture (A)  Final    STAPHYLOCOCCUS SPECIES (COAGULASE NEGATIVE) SUSCEPTIBILITIES PERFORMED ON PREVIOUS CULTURE WITHIN THE LAST 5 DAYS.    Report Status 07/14/2016 FINAL   Final  C difficile quick scan w PCR reflex     Status: None   Collection Time: 07/11/16  7:39 PM  Result Value Ref Range Status   C Diff antigen NEGATIVE NEGATIVE Final   C Diff toxin NEGATIVE NEGATIVE Final   C Diff interpretation No C. difficile detected.  Final    Radiology Reports Ct Head Wo Contrast  Result Date: 06/18/2016 CLINICAL DATA:  Altered mental status. The patient began spontaneously nonverbal during dialysis 05/16/2016. Acute infarcts on brain MRI 05/24/2016. EXAM: CT HEAD WITHOUT CONTRAST TECHNIQUE: Contiguous axial images were obtained from the base of the skull through the vertex without intravenous contrast. COMPARISON:  Brain MRI and head CT scan 05/26/2016. FINDINGS: Brain: The brain is atrophic with extensive chronic microvascular ischemic change and multiple remote infarcts bilaterally. Large infarct is again seen in the right cerebellum. No evidence of acute infarct, hemorrhage, mass lesion, mass effect, midline shift or abnormal extra-axial fluid collection. No hydrocephalus or pneumocephalus. Vascular: Atherosclerosis noted. Skull: Intact. Sinuses/Orbits: Negative. Other: None. IMPRESSION: No acute abnormality. Age advanced atrophy and multiple bilateral infarcts as seen on prior exams. Atherosclerosis. Electronically Signed   By: Drusilla Kanner M.D.   On: 06/18/2016 10:24   Ir Gastrostomy Tube Mod Sed  Result Date: 06/20/2016 CLINICAL DATA:  Previous stroke, dysphagia, needs enteral feeding support EXAM: PERC PLACEMENT GASTROSTOMY FLUOROSCOPY TIME:  2.5 minutes, 190 uGym2 DAP TECHNIQUE: The procedure, risks, benefits, and alternatives were explained to the patient and family. Questions regarding the procedure were encouraged and answered. The family understands and consents to the procedure. The patient was already receiving adequate prophylactic IV antibiotic coverage. Safe percutaneous approach was confirmed on a recent CT scan. A 5 French angiographic catheter was  placed as orogastric tube. The upper abdomen was prepped with Betadine, draped in usual sterile fashion, and infiltrated locally with 1% lidocaine. Intravenous Fentanyl and Versed were administered as conscious sedation during continuous monitoring of the patient's level of consciousness and physiological / cardiorespiratory status by the radiology RN, with a total moderate sedation time of 22 minutes. Stomach was insufflated using air through the orogastric tube. An 37 French sheath needle was advanced percutaneously into the gastric lumen under fluoroscopy. Gas could be aspirated and a small contrast injection confirmed intraluminal spread. The sheath was exchanged over a guidewire for a 9 [place] vascular sheath, through which the snare device was advanced and used to snare a guidewire passed through the orogastric tube. This was withdrawn, and the snare attached to the 20 French pull-through gastrostomy tube, which was advanced antegrade, positioned with the internal bumper securing the anterior gastric wall to the anterior abdominal wall. Small contrast injection confirms appropriate positioning. The external bumper was applied and the catheter was flushed. COMPLICATIONS: COMPLICATIONS none IMPRESSION: 1. Technically successful 20 French pull-through gastrostomy placement under fluoroscopy. Electronically Signed  By: Corlis Leak M.D.   On: 06/20/2016 16:44   Ct Femur Left Wo Contrast  Result Date: 06/27/2016 CLINICAL DATA:  Encephalopathy.  Left leg wound EXAM: CT OF THE LOWER LEFT EXTREMITY WITHOUT CONTRAST TECHNIQUE: Multidetector CT imaging of the lower left extremity was performed according to the standard protocol. COMPARISON:  06/09/2016 FINDINGS: Bones/Joint/Cartilage No acute fracture or dislocation. Normal alignment. No joint effusion. No periosteal reaction or bone destruction. Tricompartmental mild -moderate osteoarthritis of the left knee with joint space narrowing and marginal osteophytosis.  Ligaments Ligaments are suboptimally evaluated by CT. Muscles and Tendons Muscles are normal. Soft tissue Soft tissue wound along the inferior medial left thigh with a wound vac present. Soft tissue inflammation in the adjacent subcutaneous fat of the thigh. Soft tissue abnormality along the upper medial left thigh unchanged compared with the prior examination likely reflecting an area of scarring. No focal fluid collection to suggest an abscess. No soft tissue mass. Peripheral vascular atherosclerotic disease. IMPRESSION: 1. Soft tissue wound along the inferior medial left thigh with a wound vac present. Surrounding cellulitis. No drainable fluid collection to suggest an abscess. 2. Soft tissue abnormality along the upper medial left thigh unchanged compared with the prior examination likely reflecting an area of scarring. Electronically Signed   By: Elige Ko   On: 06/27/2016 14:56   Ir Fluoro Guide Cv Line Right  Result Date: 07/15/2016 INDICATION: Renal failure EXAM: TEMPORARY HEMODIALYSIS CATHETER MEDICATIONS: None. ANESTHESIA/SEDATION: None FLUOROSCOPY TIME:  Fluoroscopy Time:  minutes 12 seconds (3 mGy). COMPLICATIONS: None immediate. PROCEDURE: Informed written consent was obtained from the patient after a thorough discussion of the procedural risks, benefits and alternatives. All questions were addressed. Maximal Sterile Barrier Technique was utilized including caps, mask, sterile gowns, sterile gloves, sterile drape, hand hygiene and skin antiseptic. A timeout was performed prior to the initiation of the procedure. The right neck was prepped and draped in a sterile fashion. 1% lidocaine was utilized for local anesthesia. Under sonographic guidance, a micropuncture needle was inserted into the right jugular vein and removed over a 018 wire which was up sized to a 3 J. the temporary dialysis catheter was advanced over the wire to the cavoatrial junction. It was flushed and sewn in place. FINDINGS:  Imaging documents placement of a temporary dialysis catheter via the right jugular vein. Tip is at the cavoatrial junction. IMPRESSION: Successful temporary right jugular dialysis catheter. Electronically Signed   By: Jolaine Click M.D.   On: 07/15/2016 16:27   Ir Removal Tun Cv Cath W/o Fl  Result Date: 06/26/2016 INDICATION: History of end-stage renal disease, now with functional dialysis access. Dialysis catheter was initially placed by Dr. Lowella Dandy on 05/07/2016 and has functioned well throughout duration of usage. EXAM: REMOVAL OF TUNNELED HEMODIALYSIS CATHETER MEDICATIONS: None COMPLICATIONS: None immediate. PROCEDURE: Informed written consent was obtained from the patient's family following an explanation of the procedure, risks, benefits and alternatives to treatment. A time out was performed prior to the initiation of the procedure. Maximal barrier sterile technique was utilized including caps, mask, sterile gowns, sterile gloves, large sterile drape, hand hygiene, and Betadine. 1% lidocaine with epinephrine was injected under sterile conditions along the subcutaneous tunnel. Utilizing a combination of blunt dissection and gentle traction, the catheter was removed intact. Hemostasis was obtained with manual compression. A dressing was placed. The patient tolerated the procedure well without immediate post procedural complication. IMPRESSION: Successful removal of tunneled dialysis catheter. Electronically Signed   By: Holland Commons.D.  On: 07/05/2016 17:07   Ir US Guide Vasc Access Right  Result Date: 07/15/2016 INDICATION: Renal failure EXAM: TEMPORARY HEMODIALYSIS CATHETER MEDICATIONS: None. ANESTHESIA/SEDATION: None FLUOROSCOPY TIME:  Fluoroscopy Time:  minutes 12 seconds (3 mGy). COMPLICATIONS: None immediate. PROCEDURE: Informed written consent was obtained from the patient after a thorough discussion of the procedural risks, benefits and alternatives. All questions were addressed. Maximal Sterile  Barrier Technique was utilized including caps, mask, sterile gowns, sterile gloves, sterile drape, hand hygiene and skin antiseptic. A timeout was performed prior to the initiation of the procedure. The right neck was prepped and draped in a sterile fashion. 1% lidocaine was utilized for local anesthesia. Under sonographic guidance, a micropuncture needle was inserted into the right jugular vein and removed over a 018 wire which was up sized to a 3 J. the temporary dialysis catheter was advanced over the wire to the cavoatrial junction. It was flushed and sewn in place. FINDINGS: Imaging documents placement of a temporary dialysis catheter via the right jugular vein. Tip is at the cavoatrial junction. IMPRESSION: Successful temporary right jugular dialysis catheter. Electronically Signed   By: Jolaine Click M.D.   On: 07/15/2016 16:27   Dg Chest Port 1 View  Result Date: 07/13/2016 CLINICAL DATA:  Shortness of breath. EXAM: PORTABLE CHEST 1 VIEW COMPARISON:  Chest x-rays dated 07/11/2016 and 06/25/2016. FINDINGS: Study is limited by hypoinspiratory effort and lordotic patient positioning. Given these limitations, lungs appear grossly clear. No confluent opacity to suggest a developing pneumonia. No pleural effusion or pneumothorax seen. Heart size and mediastinal contours appear stable. No acute or suspicious osseous finding. IMPRESSION: Limited exam due to low lung volumes and lordotic patient positioning. No acute findings. No evidence of pneumonia or pulmonary edema. Electronically Signed   By: Bary Richard M.D.   On: 07/13/2016 11:24   Dg Chest Port 1 View  Result Date: 07/11/2016 CLINICAL DATA:  Shortness of breath EXAM: PORTABLE CHEST 1 VIEW COMPARISON:  Portable chest x-ray of Jun 25, 2016 FINDINGS: The right hemidiaphragm remains elevated with resultant right lung hypoinflation. The left lung is better inflated. The retrocardiac lung markings are more conspicuous today. The cardiac silhouette is  enlarged. The central pulmonary vascularity is mildly prominent though not greatly changed. The mediastinum is normal in width. There is no pleural effusion. IMPRESSION: Increased retrocardiac density on the left compatible with atelectasis or early pneumonia. There is probable low-grade CHF as well. Followup PA and lateral chest X-ray is recommended in 3-4 weeks following trial of antibiotic therapy to ensure resolution and exclude underlying malignancy. Electronically Signed   By: David  [place] M.D.   On: 07/11/2016 07:43   Dg Chest Port 1 View  Result Date: 06/25/2016 CLINICAL DATA:  Persistent low-grade fever with dyspnea. EXAM: PORTABLE CHEST 1 VIEW COMPARISON:  06/02/2016 FINDINGS: Interval removal of right IJ central venous catheter. Lungs are hypoinflated without focal consolidation or effusion. No pneumothorax. Cardiomediastinal silhouette and remainder of the exam is unchanged. IMPRESSION: Hypoinflation without acute cardiopulmonary disease. Electronically Signed   By: Elberta Fortis M.D.   On: 06/25/2016 15:55   Dg Abd Portable 1v  Result Date: 07/14/2016 CLINICAL DATA:  Nausea and possible constipation. EXAM: PORTABLE ABDOMEN - 1 VIEW COMPARISON:  07/11/2016 FINDINGS: Paucity of bowel gas without evidence of enteric obstruction. Unremarkable colonic stool burden. Gastrostomy tube overlies the left upper abdominal quadrant. Nondiagnostic evaluation for pneumoperitoneum secondary to supine patient positioning and exclusion of the lower thorax. No definite pneumatosis or portal venous gas. Multiple  phleboliths overlie the lower pelvis bilaterally. Vascular calcifications overlie the lower pelvis bilaterally. No acute osseus abnormalities. IMPRESSION: 1. Paucity of bowel gas without evidence of enteric obstruction. 2. Gastrostomy tube overlies the left upper abdominal quadrant. Electronically Signed   By: Simonne Come M.D.   On: 07/14/2016 12:01   Dg Abd Portable 1v  Result Date: 07/11/2016 CLINICAL  DATA:  Diarrhea EXAM: PORTABLE ABDOMEN - 1 VIEW COMPARISON:  Jun 13, 2016 and Jun 20, 2016 FINDINGS: Gastrostomy catheter noted in left upper abdomen. There is no bowel dilatation or air-fluid level to suggest bowel obstruction. No evident free air. Visualized lung bases are clear. There are phleboliths pelvis as well as foci of iliac artery calcification. IMPRESSION: Gastrostomy catheter in the upper abdomen. No bowel obstruction or free air. Iliac artery atherosclerosis noted. Electronically Signed   By: Bretta Bang III M.D.   On: 07/11/2016 10:56   Mr Brain Limited Wo Contrast  Result Date: 07/02/2016 CLINICAL DATA:  Increased lethargy.  Altered mental status. EXAM: MRI HEAD WITHOUT CONTRAST TECHNIQUE: Multiplanar, multiecho pulse sequences of the brain and surrounding structures were obtained without intravenous contrast. COMPARISON:  05/15/2016 FINDINGS: Brain: Intensely restricted diffusion in the lateral right thalamus and neighboring internal capsule, a 12 mm area. There is less intensely restricted diffusion in the medial right thalamus, periatrial white matter, and anterior right thalamus. The extent of acute and subacute infarct has progressed from 05/26/2016. No superimposed hemorrhage. 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 Remote infarct in the upper left pons and in the left corona radiata. Generalized atrophy with asymmetric wallerian changes seen in the left cortical spinal tracts. No acute hemorrhage, hydrocephalus, or mass. Limited study as requested by clinical team. Diffusion, FLAIR, and T2 weighted imaging acquired. Vascular: No detected change in flow voids. Skull and upper cervical spine: Negative Sinuses/Orbits: Negative IMPRESSION: 1. 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 2. Advanced remote ischemic injury. Electronically Signed   By: Marnee Spring M.D.    On: 07/02/2016 11:30    Time Spent in minutes  30   Susa Raring M.D on 07/22/2016 at 7:11 AM  Between 7am to 7pm - Pager - [phone] ( page via [email or web address], text pages only, please mention full 10 digit call back number). After 7pm go to [email or web address] - password [facility]

## 2016-08-11 NOTE — Progress Notes (Addendum)
Notified rapid response earlier that patients BP droped today during dialysis and has never recovered significantly last BP 77/29, pulse 89, respirations 22 shallow paged triad attending and called rapid again, awaiting response from triad. Received order for 250 ml bolus.

## 2016-08-11 NOTE — Progress Notes (Addendum)
Patient BP and HR started declining around 0300 had previously contacted [facility] and rapid response about patients declining condition. Called code Blue at 0310, rapid was already in the room, patient was a partial code for medication only. epinephrine was given at 0311, 0314, 0317, 0320 patient did not respond  Dr. Katrine CohoA. Burns called time of death [id number]. Attempted to call family was not able to speak to anyone I left a message. I will call them again.

## 2016-08-11 DEATH — deceased

## 2018-03-17 IMAGING — CT CT HEAD W/O CM
4 series · 15 of 47 positions shown, 17 images · non-contrast
Comparison: Brain MRI and head CT scan 06/07/2016.

CLINICAL DATA: Altered mental status. The patient began
spontaneously nonverbal during dialysis 06/07/2016. Acute infarcts
on brain MRI 06/07/2016.

EXAM:
CT HEAD WITHOUT CONTRAST
TECHNIQUE: Contiguous axial images were obtained from the base of the skull
through the vertex without intravenous contrast.

[Series 3: head bone · axial · 0.48mm/px · z∈[+259,+275]mm · 2 of 85 slices shown]
[im 9/85  bone]
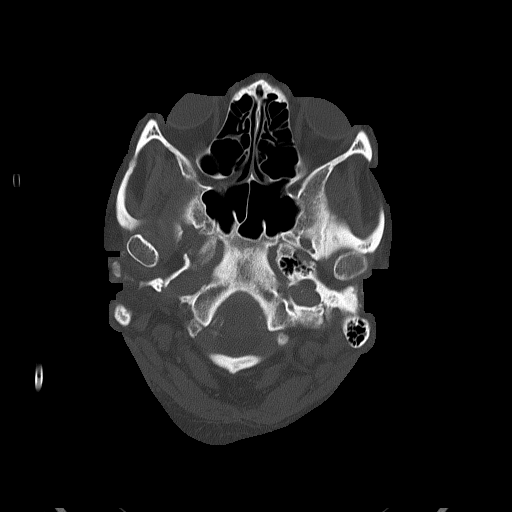
[im 17/85  bone]
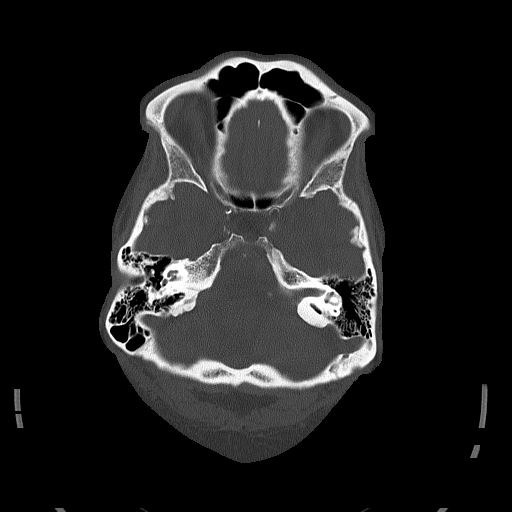

[Series 4: head without · axial · non-contrast · 0.48mm/px · z∈[+263,+383]mm · 7 of 34 slices shown, 9 images]
[im 5/34  brain]
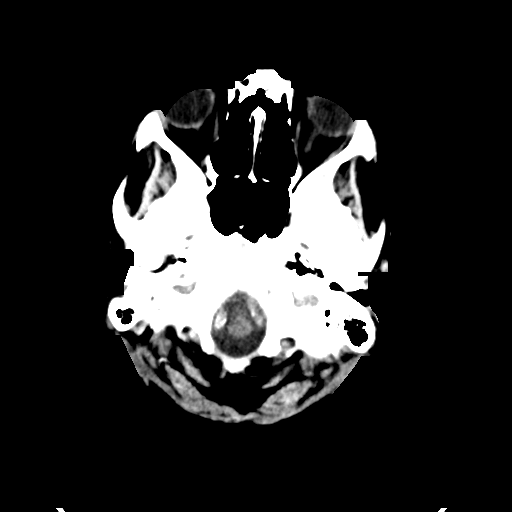
[im 5/34  bone]
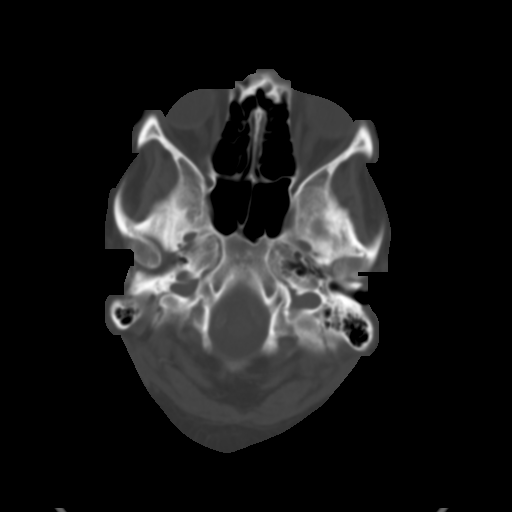
[im 9/34  brain]
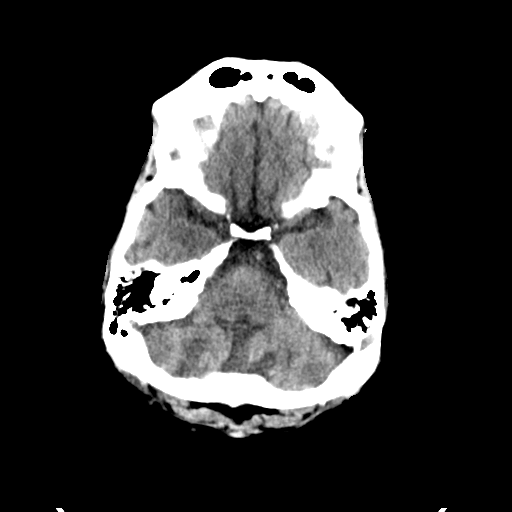
[im 13/34  brain]
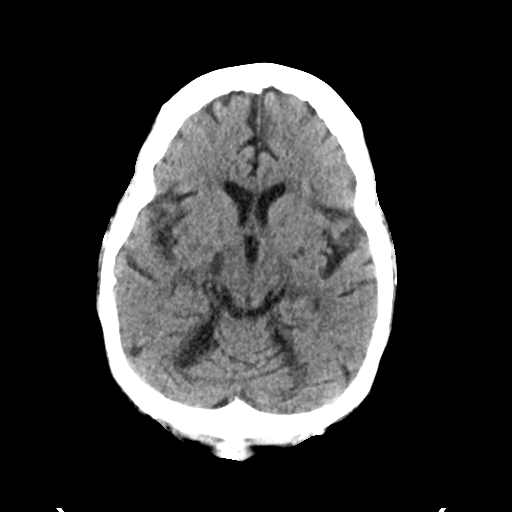
[im 17/34  brain]
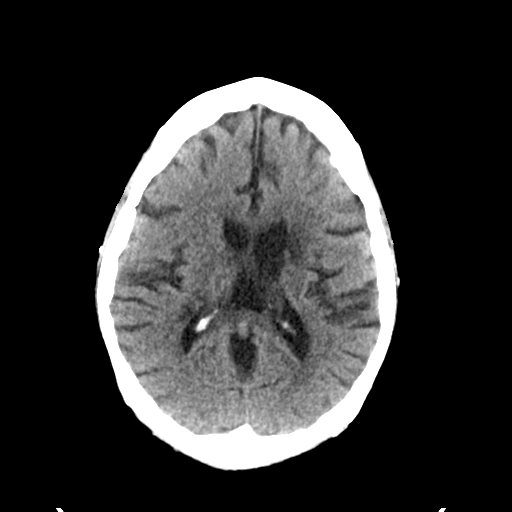
[im 21/34  brain]
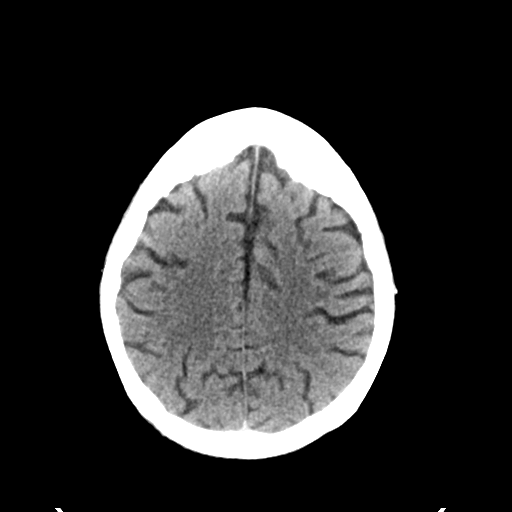
[im 21/34  bone]
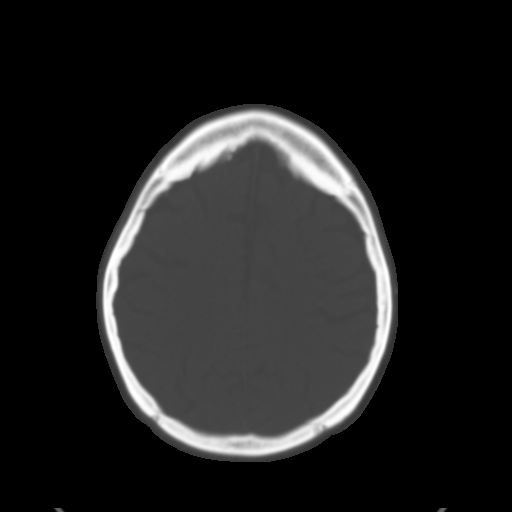
[im 25/34  brain]
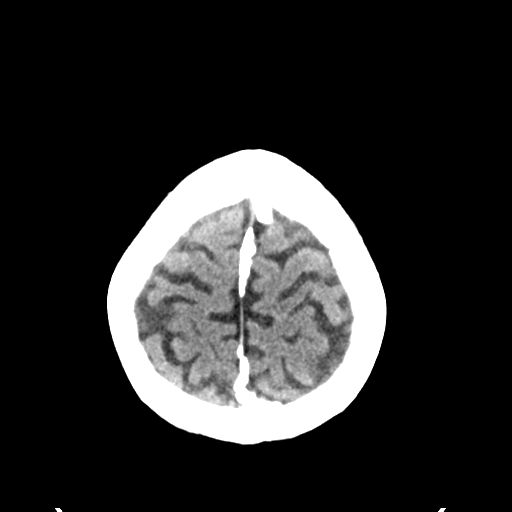
[im 29/34  brain]
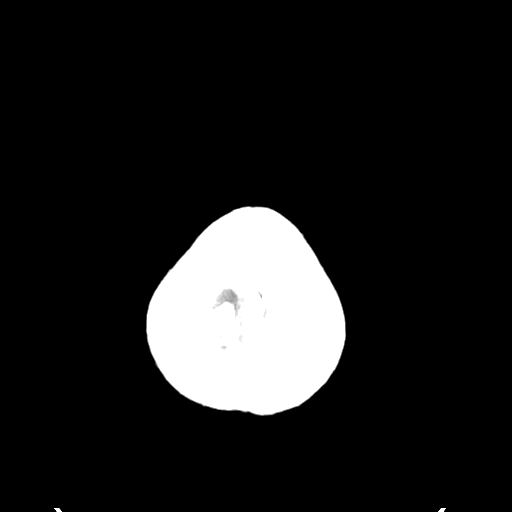

[Series 6: head without sag · sagittal · non-contrast · 0.33mm/px · 3 of 66 slices shown]
[im 22/66  brain]
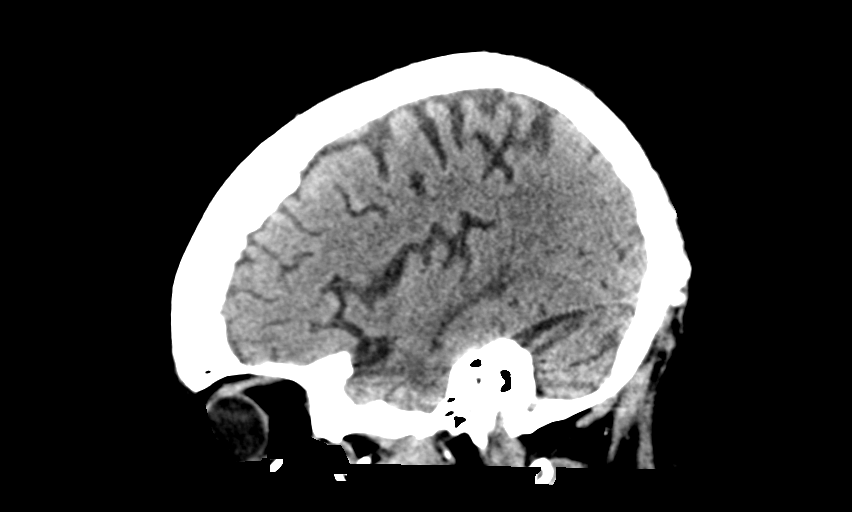
[im 33/66  brain]
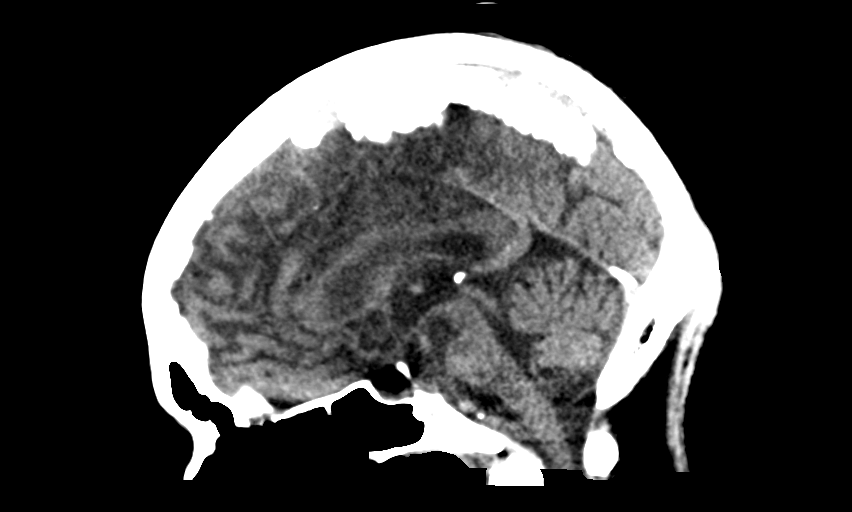
[im 44/66  brain]
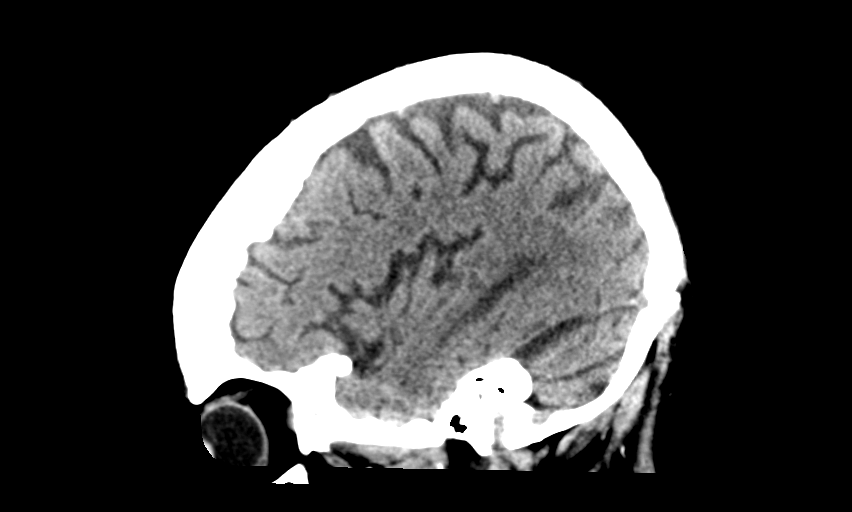

[Series 7: head without cor · coronal · non-contrast · 0.43mm/px · 3 of 74 slices shown]
[im 25/74  brain]
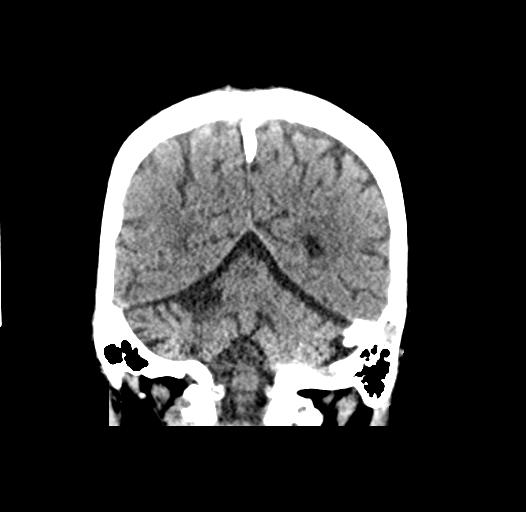
[im 33/74  brain]
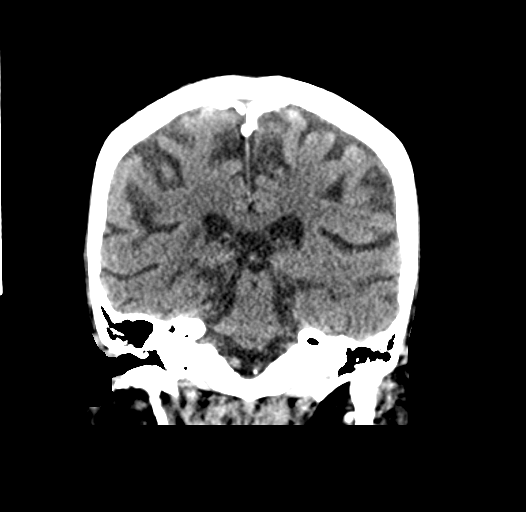
[im 41/74  brain]
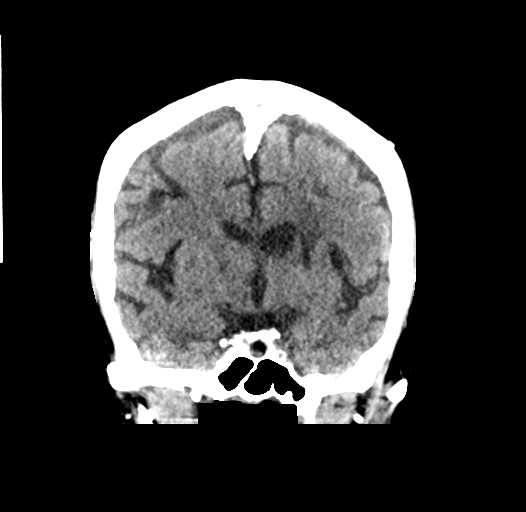

[15 of 47 positions shown; findings below may reference images not displayed]

FINDINGS: Brain: The brain is atrophic with extensive chronic microvascular
ischemic change and multiple remote infarcts bilaterally. Large
infarct is again seen in the right cerebellum. No evidence of acute
infarct, hemorrhage, mass lesion, mass effect, midline shift or
abnormal extra-axial fluid collection. No hydrocephalus or
pneumocephalus.

Vascular: Atherosclerosis noted.

Skull: Intact.

Sinuses/Orbits: Negative.

Other: None.
IMPRESSION: No acute abnormality.

Age advanced atrophy and multiple bilateral infarcts as seen on
prior exams.

Atherosclerosis.
